# Patient Record
Sex: Female | Born: 1940 | Race: Black or African American | Hispanic: No | Marital: Single | Smoking: Never smoker
Health system: Southern US, Community
[De-identification: ages and names within clinical notes are randomized; demographics above are authoritative.]

## PROBLEM LIST (undated history)

## (undated) DIAGNOSIS — E440 Moderate protein-calorie malnutrition: Secondary | ICD-10-CM

## (undated) DIAGNOSIS — F32A Depression, unspecified: Secondary | ICD-10-CM

## (undated) DIAGNOSIS — Z9289 Personal history of other medical treatment: Secondary | ICD-10-CM

## (undated) DIAGNOSIS — R011 Cardiac murmur, unspecified: Secondary | ICD-10-CM

## (undated) DIAGNOSIS — F329 Major depressive disorder, single episode, unspecified: Secondary | ICD-10-CM

## (undated) DIAGNOSIS — IMO0001 Reserved for inherently not codable concepts without codable children: Secondary | ICD-10-CM

## (undated) DIAGNOSIS — J189 Pneumonia, unspecified organism: Secondary | ICD-10-CM

## (undated) DIAGNOSIS — Z992 Dependence on renal dialysis: Principal | ICD-10-CM

## (undated) DIAGNOSIS — N186 End stage renal disease: Principal | ICD-10-CM

## (undated) DIAGNOSIS — F419 Anxiety disorder, unspecified: Secondary | ICD-10-CM

## (undated) DIAGNOSIS — C9000 Multiple myeloma not having achieved remission: Secondary | ICD-10-CM

## (undated) DIAGNOSIS — K219 Gastro-esophageal reflux disease without esophagitis: Secondary | ICD-10-CM

## (undated) DIAGNOSIS — Z789 Other specified health status: Secondary | ICD-10-CM

## (undated) DIAGNOSIS — S72401A Unspecified fracture of lower end of right femur, initial encounter for closed fracture: Secondary | ICD-10-CM

## (undated) DIAGNOSIS — D649 Anemia, unspecified: Secondary | ICD-10-CM

## (undated) DIAGNOSIS — I10 Essential (primary) hypertension: Secondary | ICD-10-CM

## (undated) DIAGNOSIS — I120 Hypertensive chronic kidney disease with stage 5 chronic kidney disease or end stage renal disease: Principal | ICD-10-CM

## (undated) HISTORY — DX: Personal history of other medical treatment: Z92.89

## (undated) HISTORY — PX: AV FISTULA PLACEMENT: SHX1204

## (undated) HISTORY — DX: End stage renal disease: N18.6

## (undated) HISTORY — DX: Unspecified fracture of lower end of right femur, initial encounter for closed fracture: S72.401A

## (undated) HISTORY — DX: Moderate protein-calorie malnutrition: E44.0

## (undated) HISTORY — DX: Hypertensive chronic kidney disease with stage 5 chronic kidney disease or end stage renal disease: I12.0

## (undated) HISTORY — DX: Dependence on renal dialysis: Z99.2

---

## 2014-03-11 ENCOUNTER — Encounter (HOSPITAL_COMMUNITY): Payer: Self-pay | Admitting: Emergency Medicine

## 2014-03-11 ENCOUNTER — Inpatient Hospital Stay (HOSPITAL_COMMUNITY)
Admission: EM | Admit: 2014-03-11 | Discharge: 2014-03-18 | DRG: 840 | Disposition: A | Discharge status: Home / Self Care | Payer: Medicare Other | Attending: Internal Medicine | Admitting: Internal Medicine

## 2014-03-11 DIAGNOSIS — K922 Gastrointestinal hemorrhage, unspecified: Secondary | ICD-10-CM | POA: Diagnosis present

## 2014-03-11 DIAGNOSIS — D62 Acute posthemorrhagic anemia: Secondary | ICD-10-CM

## 2014-03-11 DIAGNOSIS — K219 Gastro-esophageal reflux disease without esophagitis: Secondary | ICD-10-CM | POA: Diagnosis present

## 2014-03-11 DIAGNOSIS — N186 End stage renal disease: Secondary | ICD-10-CM | POA: Diagnosis present

## 2014-03-11 DIAGNOSIS — Z8661 Personal history of infections of the central nervous system: Secondary | ICD-10-CM | POA: Diagnosis not present

## 2014-03-11 DIAGNOSIS — I120 Hypertensive chronic kidney disease with stage 5 chronic kidney disease or end stage renal disease: Secondary | ICD-10-CM

## 2014-03-11 DIAGNOSIS — D6959 Other secondary thrombocytopenia: Secondary | ICD-10-CM | POA: Diagnosis present

## 2014-03-11 DIAGNOSIS — E871 Hypo-osmolality and hyponatremia: Secondary | ICD-10-CM | POA: Diagnosis present

## 2014-03-11 DIAGNOSIS — D649 Anemia, unspecified: Secondary | ICD-10-CM

## 2014-03-11 DIAGNOSIS — F09 Unspecified mental disorder due to known physiological condition: Secondary | ICD-10-CM | POA: Diagnosis present

## 2014-03-11 DIAGNOSIS — I10 Essential (primary) hypertension: Secondary | ICD-10-CM

## 2014-03-11 DIAGNOSIS — M1710 Unilateral primary osteoarthritis, unspecified knee: Secondary | ICD-10-CM | POA: Diagnosis present

## 2014-03-11 DIAGNOSIS — R5383 Other fatigue: Secondary | ICD-10-CM

## 2014-03-11 DIAGNOSIS — C9000 Multiple myeloma not having achieved remission: Principal | ICD-10-CM

## 2014-03-11 DIAGNOSIS — D61818 Other pancytopenia: Secondary | ICD-10-CM

## 2014-03-11 DIAGNOSIS — R5381 Other malaise: Secondary | ICD-10-CM | POA: Diagnosis present

## 2014-03-11 DIAGNOSIS — Z9221 Personal history of antineoplastic chemotherapy: Secondary | ICD-10-CM | POA: Diagnosis not present

## 2014-03-11 DIAGNOSIS — Z992 Dependence on renal dialysis: Secondary | ICD-10-CM

## 2014-03-11 DIAGNOSIS — K649 Unspecified hemorrhoids: Secondary | ICD-10-CM | POA: Diagnosis present

## 2014-03-11 DIAGNOSIS — IMO0002 Reserved for concepts with insufficient information to code with codable children: Secondary | ICD-10-CM

## 2014-03-11 DIAGNOSIS — N2581 Secondary hyperparathyroidism of renal origin: Secondary | ICD-10-CM | POA: Diagnosis present

## 2014-03-11 DIAGNOSIS — K625 Hemorrhage of anus and rectum: Secondary | ICD-10-CM

## 2014-03-11 HISTORY — DX: Multiple myeloma not having achieved remission: C90.00

## 2014-03-11 HISTORY — DX: End stage renal disease: N18.6

## 2014-03-11 HISTORY — DX: Essential (primary) hypertension: I10

## 2014-03-11 HISTORY — DX: Hypertensive chronic kidney disease with stage 5 chronic kidney disease or end stage renal disease: I12.0

## 2014-03-11 HISTORY — DX: Anemia, unspecified: D64.9

## 2014-03-11 HISTORY — DX: Pneumonia, unspecified organism: J18.9

## 2014-03-11 HISTORY — DX: Gastro-esophageal reflux disease without esophagitis: K21.9

## 2014-03-11 LAB — CBC WITH DIFFERENTIAL/PLATELET
Basophils Absolute: 0 10*3/uL (ref 0.0–0.1)
Basophils Relative: 0 % (ref 0–1)
Eosinophils Absolute: 0.1 10*3/uL (ref 0.0–0.7)
Eosinophils Relative: 4 % (ref 0–5)
HCT: 19.2 % — ABNORMAL LOW (ref 36.0–46.0)
HEMOGLOBIN: 5.9 g/dL — AB (ref 12.0–15.0)
LYMPHS PCT: 29 % (ref 12–46)
Lymphs Abs: 0.8 10*3/uL (ref 0.7–4.0)
MCH: 28.8 pg (ref 26.0–34.0)
MCHC: 30.7 g/dL (ref 30.0–36.0)
MCV: 93.7 fL (ref 78.0–100.0)
MONO ABS: 0.3 10*3/uL (ref 0.1–1.0)
Monocytes Relative: 9 % (ref 3–12)
NEUTROS ABS: 1.6 10*3/uL — AB (ref 1.7–7.7)
NEUTROS PCT: 57 % (ref 43–77)
Platelets: 76 10*3/uL — ABNORMAL LOW (ref 150–400)
RBC: 2.05 MIL/uL — ABNORMAL LOW (ref 3.87–5.11)
RDW: 18 % — ABNORMAL HIGH (ref 11.5–15.5)
WBC: 2.8 10*3/uL — AB (ref 4.0–10.5)

## 2014-03-11 LAB — COMPREHENSIVE METABOLIC PANEL
ALK PHOS: 37 U/L — AB (ref 39–117)
ALT: 11 U/L (ref 0–35)
AST: 29 U/L (ref 0–37)
Albumin: 2.6 g/dL — ABNORMAL LOW (ref 3.5–5.2)
BUN: 25 mg/dL — AB (ref 6–23)
CHLORIDE: 96 meq/L (ref 96–112)
CO2: 26 meq/L (ref 19–32)
CREATININE: 6.08 mg/dL — AB (ref 0.50–1.10)
Calcium: 8.8 mg/dL (ref 8.4–10.5)
GFR calc Af Amer: 7 mL/min — ABNORMAL LOW (ref >90)
GFR, EST NON AFRICAN AMERICAN: 6 mL/min — AB (ref >90)
Glucose, Bld: 81 mg/dL (ref 70–99)
POTASSIUM: 4.4 meq/L (ref 3.7–5.3)
Sodium: 129 mEq/L — ABNORMAL LOW (ref 137–147)
Total Bilirubin: 0.4 mg/dL (ref 0.3–1.2)
Total Protein: >15 g/dL — ABNORMAL HIGH (ref 6.0–8.3)

## 2014-03-11 LAB — TROPONIN I: Troponin I: <0.3 ng/mL (ref <0.30)

## 2014-03-11 LAB — POC OCCULT BLOOD, ED: FECAL OCCULT BLD: POSITIVE — AB

## 2014-03-11 LAB — PREPARE RBC (CROSSMATCH)

## 2014-03-11 LAB — ABO/RH: ABO/RH(D): O POS

## 2014-03-11 MED ORDER — GUAIFENESIN-DM 100-10 MG/5ML PO SYRP
10.0000 mL | ORAL_SOLUTION | Freq: Three times a day (TID) | ORAL | Status: DC | PRN
  Filled 2014-03-11: qty 10

## 2014-03-11 MED ORDER — CYPROHEPTADINE HCL 4 MG PO TABS
4.0000 mg | ORAL_TABLET | Freq: Two times a day (BID) | ORAL | Status: DC
  Administered 2014-03-12 – 2014-03-18 (×14): 4 mg via ORAL
  Filled 2014-03-11 (×16): qty 1

## 2014-03-11 MED ORDER — SODIUM CHLORIDE 0.9 % IJ SOLN
3.0000 mL | Freq: Two times a day (BID) | INTRAMUSCULAR | Status: DC
  Administered 2014-03-12 – 2014-03-18 (×12): 3 mL via INTRAVENOUS

## 2014-03-11 MED ORDER — PROMETHAZINE-CODEINE 6.25-10 MG/5ML PO SYRP
5.0000 mL | ORAL_SOLUTION | Freq: Four times a day (QID) | ORAL | Status: DC | PRN
  Administered 2014-03-13: 5 mL via ORAL
  Filled 2014-03-11: qty 5

## 2014-03-11 MED ORDER — ISOSORBIDE MONONITRATE ER 60 MG PO TB24
60.0000 mg | ORAL_TABLET | Freq: Every day | ORAL | Status: DC
  Administered 2014-03-12 – 2014-03-18 (×6): 60 mg via ORAL
  Filled 2014-03-11 (×7): qty 1

## 2014-03-11 MED ORDER — VALACYCLOVIR HCL 500 MG PO TABS
500.0000 mg | ORAL_TABLET | Freq: Two times a day (BID) | ORAL | Status: DC
  Administered 2014-03-12 (×2): 500 mg via ORAL
  Filled 2014-03-11 (×3): qty 1

## 2014-03-11 MED ORDER — OXYBUTYNIN CHLORIDE 5 MG PO TABS
5.0000 mg | ORAL_TABLET | Freq: Every day | ORAL | Status: DC
  Administered 2014-03-12 – 2014-03-18 (×6): 5 mg via ORAL
  Filled 2014-03-11 (×7): qty 1

## 2014-03-11 MED ORDER — METOLAZONE 5 MG PO TABS
5.0000 mg | ORAL_TABLET | Freq: Two times a day (BID) | ORAL | Status: DC
  Administered 2014-03-12 – 2014-03-18 (×12): 5 mg via ORAL
  Filled 2014-03-11 (×16): qty 1

## 2014-03-11 MED ORDER — HYDRALAZINE HCL 50 MG PO TABS
100.0000 mg | ORAL_TABLET | Freq: Three times a day (TID) | ORAL | Status: DC
  Administered 2014-03-12 – 2014-03-18 (×16): 100 mg via ORAL
  Filled 2014-03-11 (×25): qty 2

## 2014-03-11 MED ORDER — ACETAMINOPHEN-CODEINE #3 300-30 MG PO TABS
1.0000 | ORAL_TABLET | ORAL | Status: DC | PRN
  Administered 2014-03-12: 1 via ORAL
  Filled 2014-03-11: qty 1

## 2014-03-11 MED ORDER — NIFEDIPINE ER OSMOTIC RELEASE 90 MG PO TB24
90.0000 mg | ORAL_TABLET | Freq: Every day | ORAL | Status: DC
  Administered 2014-03-12 – 2014-03-18 (×6): 90 mg via ORAL
  Filled 2014-03-11 (×7): qty 1

## 2014-03-11 MED ORDER — SEVELAMER CARBONATE 800 MG PO TABS
800.0000 mg | ORAL_TABLET | Freq: Three times a day (TID) | ORAL | Status: DC
  Administered 2014-03-12 – 2014-03-18 (×12): 800 mg via ORAL
  Filled 2014-03-11 (×22): qty 1

## 2014-03-11 MED ORDER — DEXTROMETHORPHAN-GUAIFENESIN 10-100 MG/5ML PO LIQD: 10.0000 mL | Freq: Three times a day (TID) | ORAL | Status: DC | PRN

## 2014-03-11 NOTE — ED Notes (Signed)
Family at bedside. 

## 2014-03-11 NOTE — ED Provider Notes (Addendum)
CSN: [id number]     Arrival date & time 03/11/14  1627 History   First MD Initiated Contact with Patient 03/11/14 1628     Chief Complaint  Patient presents with  . Abnormal Lab     (Consider location/radiation/quality/duration/timing/severity/associated sxs/prior Treatment) The history is provided by the patient. No language interpreter was used.  [name] is a 73 -year-old female with past medical history of end-stage renal disease on dialysis every Tuesday/Thursday/Saturday, hypertension, anemia, multiple myeloma, thrombocytopenia presenting to the ED with weakness and fatigue. Patient reported that she had dialysis performed earlier today and had received a phone call regarding that her hemoglobin was low-as per patient, reported that her hemoglobin was 6.5. Patient recently moved from [place] to [place] a short period ago. As per patient, reported that she has close to a dozen transfusions in her lifetime thus far. Stated that she started dialysis in October 2014. Denied fever, chest pain, shortness of breath, difficulty breathing, nausea, vomiting, diarrhea, melena, hematochezia, hematemesis, hemoptysis, hematuria, dysuria. PCP none  Past Medical History  Diagnosis Date  . Renal disorder   . ESRD (end stage renal disease)   . Hypertension   . Multiple myeloma 2009  . Thyroid disease   . Hyperparathyroidism   . Thrombocytopenia   . Anemia    History reviewed. No pertinent past surgical history. History reviewed. No pertinent family history. History  Substance Use Topics  . Smoking status: Not on file  . Smokeless tobacco: Not on file  . Alcohol Use: Not on file   OB History   Grav Para Term Preterm Abortions TAB SAB Ect Mult Living                 Review of Systems  Constitutional: Positive for fatigue. Negative for fever and appetite change.  Respiratory: Negative for chest tightness and shortness of breath.   Cardiovascular: Negative for chest pain.   Gastrointestinal: Negative for nausea, vomiting, abdominal pain, diarrhea, constipation, blood in stool and anal bleeding.  Genitourinary: Negative for dysuria and decreased urine volume.  Neurological: Positive for dizziness and weakness. Negative for headaches.      Allergies  Review of patient's allergies indicates no known allergies.  Home Medications   Prior to Admission medications   Medication Sig Start Date End Date Taking? Authorizing Provider  acetaminophen-codeine (TYLENOL #3) 300-30 MG per tablet Take by mouth every 4 (four) hours as needed for moderate pain.   Yes Historical Provider, MD  cyproheptadine (PERIACTIN) 4 MG tablet Take 4 mg by mouth 2 (two) times daily.   Yes Historical Provider, MD  dextromethorphan-guaiFENesin (ROBITUSSIN-DM) 10-100 MG/5ML liquid Take 10 mLs by mouth 3 (three) times daily as needed for cough.   Yes Historical Provider, MD  hydrALAZINE (APRESOLINE) 100 MG tablet Take 100 mg by mouth every 8 (eight) hours.   Yes Historical Provider, MD  isosorbide mononitrate (IMDUR) 60 MG 24 hr tablet Take 60 mg by mouth daily.   Yes Historical Provider, MD  metolazone (ZAROXOLYN) 5 MG tablet Take 5 mg by mouth 2 (two) times daily.   Yes Historical Provider, MD  Multiple Vitamins-Iron (DAILY-VITE/IRON PO) Take 1 tablet by mouth daily.   Yes Historical Provider, MD  NIFEdipine (PROCARDIA XL/ADALAT-CC) 90 MG 24 hr tablet Take 90 mg by mouth daily.   Yes Historical Provider, MD  oxybutynin (DITROPAN) 5 MG tablet Take 5 mg by mouth daily.   Yes Historical Provider, MD  promethazine-codeine (PHENERGAN WITH CODEINE) 6.25-10 MG/5ML syrup Take 5  mLs by mouth every 6 (six) hours as needed for cough.   Yes Historical Provider, MD  sevelamer carbonate (RENVELA) 800 MG tablet Take 800 mg by mouth 3 (three) times daily with meals.   Yes Historical Provider, MD  valACYclovir (VALTREX) 500 MG tablet Take 500 mg by mouth 2 (two) times daily.   Yes Historical Provider, MD   BP  174/107  Pulse 67  Temp(Src) 98.7 F (37.1 C) (Oral)  Resp 21  SpO2 100% Physical Exam  Nursing note and vitals reviewed. Constitutional: She is oriented to person, place, and time. She appears well-developed and well-nourished. No distress.  HENT:  Head: Normocephalic and atraumatic.  Mouth/Throat: No oropharyngeal exudate.  Dry mucous membranes noted  Eyes: Conjunctivae and EOM are normal. Pupils are equal, round, and reactive to light. Right eye exhibits no discharge. Left eye exhibits no discharge.  Neck: Normal range of motion. Neck supple. No tracheal deviation present.  Cardiovascular: Normal rate, regular rhythm and normal heart sounds.  Exam reveals no friction rub.   No murmur heard. Pulses:      Radial pulses are 2+ on the right side, and 2+ on the left side.       Dorsalis pedis pulses are 2+ on the right side, and 2+ on the left side.  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  Pulmonary/Chest: Effort normal and breath sounds normal. No respiratory distress. She has no wheezes. She has no rales.  Genitourinary: Guaiac positive stool.  Rectal exam: Toilet paper at here to the anus with bright red blood. Negative swelling, erythema, inflammation, lesions, sores, external hemorrhoids identified to the anus. Negative active drainage or bleeding noted. Strong sphincter tone. Negative palpation of masses or lesions noted, negative palpation of polyps. Bright red blood noted on glove.  Musculoskeletal: Normal range of motion.  Full ROM to upper and lower extremities without difficulty noted, negative ataxia noted.  Lymphadenopathy:    She has no cervical adenopathy.  Neurological: She is alert and oriented to person, place, and time. No cranial nerve deficit. She exhibits normal muscle tone. Coordination normal.  Cranial  nerves III-XII grossly intact Strength 5+/5+ to upper and lower extremities bilaterally with resistance applied, equal distribution noted Equal grip strength bilaterally Negative facial drooping Negative slurred speech Negative aphasia  Skin: Skin is warm and dry. No rash noted. She is not diaphoretic. No erythema. No pallor.  Psychiatric: She has a normal mood and affect. Her behavior is normal. Thought content normal.    ED Course  Procedures (including critical care time)  7:25 PM This provider spoke with Dr. Sheran Luz - discussed case, history, labs in great detail. Patient to be admitted to the hospital for anemia and possible GI bleed-patient to telemetry as inpatient.  Results for orders placed during the hospital encounter of 03/11/14  CBC WITH DIFFERENTIAL      Result Value Ref Range   WBC 2.8 (*) 4.0 - 10.5 K/uL   RBC 2.05 (*) 3.87 - 5.11 MIL/uL   Hemoglobin 5.9 (*) 12.0 - 15.0 g/dL   HCT 19.2 (*) 36.0 - 46.0 %   MCV 93.7  78.0 - 100.0 fL   MCH 28.8  26.0 - 34.0 pg   MCHC 30.7  30.0 - 36.0 g/dL   RDW 18.0 (*) 11.5 - 15.5 %   Platelets 76 (*) 150 - 400  K/uL   Neutrophils Relative % 57  43 - 77 %   Neutro Abs 1.6 (*) 1.7 - 7.7 K/uL   Lymphocytes Relative 29  12 - 46 %   Lymphs Abs 0.8  0.7 - 4.0 K/uL   Monocytes Relative 9  3 - 12 %   Monocytes Absolute 0.3  0.1 - 1.0 K/uL   Eosinophils Relative 4  0 - 5 %   Eosinophils Absolute 0.1  0.0 - 0.7 K/uL   Basophils Relative 0  0 - 1 %   Basophils Absolute 0.0  0.0 - 0.1 K/uL  COMPREHENSIVE METABOLIC PANEL      Result Value Ref Range   Sodium 129 (*) 137 - 147 mEq/L   Potassium 4.4  3.7 - 5.3 mEq/L   Chloride 96  96 - 112 mEq/L   CO2 26  19 - 32 mEq/L   Glucose, Bld 81  70 - 99 mg/dL   BUN 25 (*) 6 - 23 mg/dL   Creatinine, Ser 6.08 (*) 0.50 - 1.10 mg/dL   Calcium 8.8  8.4 - 10.5 mg/dL   Total Protein >15.0 (*) 6.0 - 8.3 g/dL   Albumin 2.6 (*) 3.5 - 5.2 g/dL   AST 29  0 - 37 U/L   ALT 11  0 - 35 U/L   Alkaline  Phosphatase 37 (*) 39 - 117 U/L   Total Bilirubin 0.4  0.3 - 1.2 mg/dL   GFR calc non Af Amer 6 (*) >90 mL/min   GFR calc Af Amer 7 (*) >90 mL/min  POC OCCULT BLOOD, ED      Result Value Ref Range   Fecal Occult Bld POSITIVE (*) NEGATIVE  TYPE AND SCREEN      Result Value Ref Range   ABO/RH(D) O POS     Antibody Screen NEG     Sample Expiration 03/14/2014     Unit Number [id number]     Blood Component Type RBC LR PHER2     Unit division 00     Status of Unit ISSUED     Transfusion Status OK TO TRANSFUSE     Crossmatch Result Compatible    ABO/RH      Result Value Ref Range   ABO/RH(D) O POS      Labs Review Labs Reviewed  CBC WITH DIFFERENTIAL - Abnormal; Notable for the following:    WBC 2.8 (*)    RBC 2.05 (*)    Hemoglobin 5.9 (*)    HCT 19.2 (*)    RDW 18.0 (*)    Platelets 76 (*)    Neutro Abs 1.6 (*)    All other components within normal limits  COMPREHENSIVE METABOLIC PANEL - Abnormal; Notable for the following:    Sodium 129 (*)    BUN 25 (*)    Creatinine, Ser 6.08 (*)    Total Protein >15.0 (*)    Albumin 2.6 (*)    Alkaline Phosphatase 37 (*)    GFR calc non Af Amer 6 (*)    GFR calc Af Amer 7 (*)    All other components within normal limits  POC OCCULT BLOOD, ED - Abnormal; Notable for the following:    Fecal Occult Bld POSITIVE (*)    All other components within normal limits  TROPONIN I  HEMOGLOBIN AND HEMATOCRIT, BLOOD  CBC  BASIC METABOLIC PANEL  TYPE AND SCREEN  ABO/RH  PREPARE RBC (CROSSMATCH)    Imaging Review No results found.   EKG Interpretation  Date/Time:  Thursday March 11 2014 16:35:41 EDT Ventricular Rate:  66 PR Interval:  181 QRS Duration: 86 QT Interval:  477 QTC Calculation: 500 R Axis:   -3 Text Interpretation:  Sinus rhythm Borderline T wave abnormalities  Borderline prolonged QT interval No prior for comparison Confirmed by  DOCHERTY  MD, MEGAN ([id number]) on 03/11/2014 7:34:00 PM      MDM   Final diagnoses:   Anemia  GI bleed  ESRD on dialysis    Filed Vitals:   03/11/14 1833 03/11/14 1835 03/11/14 1845 03/11/14 1859  BP: 165/96 165/96 155/91 174/107  Pulse: 64 65 67   Temp: 98.4 F (36.9 C)   98.7 F (37.1 C)  TempSrc: Oral   Oral  Resp: '16 16 21   ' SpO2: 100% 99% 100%     EKG noted normal sinus rhythm with a heart rate of 66 bpm with T wave abnormalities with borderline prolonged QT interval - no prior EKG tracing noted. Troponin negative elevation. CBC noted low white blood cell count of 2.8. Low red blood cells of 2.05. Hemoglobin decreased to 5.9 with hematocrit 19.2. Platelets 76. Elevated BUN and creatinine-BUN 25, creatinine 6.08-patient has end-stage renal disease. Patient hyponatremic with sodium of 129. Protein greater than 15.0-patient has history of multiple myeloma. Fecal occult positive. Patient presenting to the ED with fatigue, lethargy after dialysis session today. Patient was called that she had a hemoglobin of 6.5. While in ED setting hemoglobin dropped to 5.9. Bright red blood noted on stool card. Patient has history of blood transfusions, recently moved from [place] to [place]. Patient stable, afebrile. Patient not septic appearing. Patient to be admitted to the hospital for GI bleed, drop in hemoglobin, drop in platelets. Patient does have history of multiple myeloma. Patient given blood transfusion while in the ED setting. Discussed plan for admission with patient and family who agree to plan of care. Patient admits to telemetry as inpatient under the care of Dr. Sheran Luz.  Jamse Mead, PA-C 03/13/14 [place], PA-C 03/15/14 [id number]

## 2014-03-11 NOTE — ED Notes (Signed)
Marissa PA-C notified of pt's hemoglobin

## 2014-03-11 NOTE — ED Notes (Signed)
Per EMS: pt went to dialysis today completing a full treatment. Pt goes to dialysis Tuesday, thursdays, saturdays. While at dialysis pt had an abnormal hemoglobin of 6.6. Pt A&Ox4, respirations equal and unlabored, skin warm and dry. No other complaints

## 2014-03-11 NOTE — ED Notes (Signed)
Dr. Gardner at bedside 

## 2014-03-11 NOTE — H&P (Signed)
[facility] History and Physical  Daveah Varone HGD:[id number] DOB: Jun 02, 1941 DOA: 03/11/2014  Referring physician: EDP PCP: No primary provider on file.   Chief Complaint: low HGB   HPI: [name] is a 73 y.o. female with ESRD, dialized earlier today.  Received phone call after dialysis stating that her HGB is low so she presented to ED.  She is asymptomatic at this time, denies bleeding from anywhere subjectively (objectively she has BRBPR on rectal exam).  No blood loss today at dialysis (did lose a lot on 02/27/14 she states).  No hematemesis, no black tarry stool (again, subjective only).  She has a h/o multiple myeloma, and has had GIB in the past worked up in [place].  Review of Systems: Systems reviewed.  As above, otherwise negative  Past Medical History  Diagnosis Date  . Renal disorder   . ESRD (end stage renal disease)   . Hypertension   . Multiple myeloma 2009  . Thyroid disease   . Hyperparathyroidism   . Thrombocytopenia   . Anemia    History reviewed. No pertinent past surgical history. Social History:  has no tobacco, alcohol, and drug history on file.  No Known Allergies  History reviewed. No pertinent family history.   Prior to Admission medications   Medication Sig Start Date End Date Taking? Authorizing Provider  acetaminophen-codeine (TYLENOL #3) 300-30 MG per tablet Take by mouth every 4 (four) hours as needed for moderate pain.   Yes Historical Provider, MD  cyproheptadine (PERIACTIN) 4 MG tablet Take 4 mg by mouth 2 (two) times daily.   Yes Historical Provider, MD  dextromethorphan-guaiFENesin (ROBITUSSIN-DM) 10-100 MG/5ML liquid Take 10 mLs by mouth 3 (three) times daily as needed for cough.   Yes Historical Provider, MD  hydrALAZINE (APRESOLINE) 100 MG tablet Take 100 mg by mouth every 8 (eight) hours.   Yes Historical Provider, MD  isosorbide mononitrate (IMDUR) 60 MG 24 hr tablet Take 60 mg by mouth daily.   Yes Historical Provider, MD   metolazone (ZAROXOLYN) 5 MG tablet Take 5 mg by mouth 2 (two) times daily.   Yes Historical Provider, MD  Multiple Vitamins-Iron (DAILY-VITE/IRON PO) Take 1 tablet by mouth daily.   Yes Historical Provider, MD  NIFEdipine (PROCARDIA XL/ADALAT-CC) 90 MG 24 hr tablet Take 90 mg by mouth daily.   Yes Historical Provider, MD  oxybutynin (DITROPAN) 5 MG tablet Take 5 mg by mouth daily.   Yes Historical Provider, MD  promethazine-codeine (PHENERGAN WITH CODEINE) 6.25-10 MG/5ML syrup Take 5 mLs by mouth every 6 (six) hours as needed for cough.   Yes Historical Provider, MD  sevelamer carbonate (RENVELA) 800 MG tablet Take 800 mg by mouth 3 (three) times daily with meals.   Yes Historical Provider, MD  valACYclovir (VALTREX) 500 MG tablet Take 500 mg by mouth 2 (two) times daily.   Yes Historical Provider, MD   Physical Exam: Filed Vitals:   03/11/14 2000  BP: 180/100  Pulse: 65  Temp:   Resp: 20    BP 180/100  Pulse 65  Temp(Src) 98.7 F (37.1 C) (Oral)  Resp 20  SpO2 97%  General Appearance:    Alert, oriented, no distress, appears stated age  Head:    Normocephalic, atraumatic  Eyes:    PERRL, EOMI, sclera non-icteric        Nose:   Nares without drainage or epistaxis. Mucosa, turbinates normal  Throat:   Moist mucous membranes. Oropharynx without erythema or exudate.  Neck:  Supple. No carotid bruits.  No thyromegaly.  No lymphadenopathy.   Back:     No CVA tenderness, no spinal tenderness  Lungs:     Clear to auscultation bilaterally, without wheezes, rhonchi or rales  Chest wall:    No tenderness to palpitation  Heart:    Regular rate and rhythm without murmurs, gallops, rubs  Abdomen:     Soft, non-tender, nondistended, normal bowel sounds, no organomegaly  Genitalia:    deferred  Rectal:    deferred  Extremities:   No clubbing, cyanosis or edema.  Pulses:   2+ and symmetric all extremities  Skin:   Skin color, texture, turgor normal, no rashes or lesions  Lymph nodes:    Cervical, supraclavicular, and axillary nodes normal  Neurologic:   CNII-XII intact. Normal strength, sensation and reflexes      throughout    Labs on Admission:  Basic Metabolic Panel:  Recent Labs Lab 03/11/14 1658  NA 129*  K 4.4  CL 96  CO2 26  GLUCOSE 81  BUN 25*  CREATININE 6.08*  CALCIUM 8.8   Liver Function Tests:  Recent Labs Lab 03/11/14 1658  AST 29  ALT 11  ALKPHOS 37*  BILITOT 0.4  PROT >15.0*  ALBUMIN 2.6*   No results found for this basename: LIPASE, AMYLASE,  in the last 168 hours No results found for this basename: AMMONIA,  in the last 168 hours CBC:  Recent Labs Lab 03/11/14 1658  WBC 2.8*  NEUTROABS 1.6*  HGB 5.9*  HCT 19.2*  MCV 93.7  PLT 76*   Cardiac Enzymes: No results found for this basename: CKTOTAL, CKMB, CKMBINDEX, TROPONINI,  in the last 168 hours  BNP (last 3 results) No results found for this basename: PROBNP,  in the last 8760 hours CBG: No results found for this basename: GLUCAP,  in the last 168 hours  Radiological Exams on Admission: No results found.  EKG: Independently reviewed.  Assessment/Plan Principal Problem:   BRBPR (bright red blood per rectum) Active Problems:   Acute blood loss anemia   ESRD (end stage renal disease)   Multiple myeloma   Pancytopenia   1. BRBPR and acute blood loss anemia - admitting patient, needs GI consult in AM.  Transfusing 2 units PRBC, recheck at midnight and again in AM.  SCDs only for DVT ppx. 2. Thrombocytopenia - platelets of 76, monitor but no transfusion at this point.  Pancytopenia likely chronic with MM. 3. MM - patient has declined treatment, known to be active. 4. ESRD - dialysis TTS, last dialysis earlier today. 5. HTN - continue home meds.    Code Status: Full  Family Communication: Family at bedside Disposition Plan: Admit to inpatient   Time spent: 67 min  Marielis Samara M. [facility] Pager [phone]  If 7AM-7PM, please contact the day team  taking care of the patient [email or web address] Password [facility] 03/11/2014, 8:05 PM

## 2014-03-12 ENCOUNTER — Inpatient Hospital Stay (HOSPITAL_COMMUNITY): Payer: Medicare Other

## 2014-03-12 LAB — CBC
HCT: 20.3 % — ABNORMAL LOW (ref 36.0–46.0)
Hemoglobin: 6.4 g/dL — CL (ref 12.0–15.0)
MCH: 29.4 pg (ref 26.0–34.0)
MCHC: 31.5 g/dL (ref 30.0–36.0)
MCV: 93.1 fL (ref 78.0–100.0)
Platelets: 80 10*3/uL — ABNORMAL LOW (ref 150–400)
RBC: 2.18 MIL/uL — AB (ref 3.87–5.11)
RDW: 18.7 % — ABNORMAL HIGH (ref 11.5–15.5)
WBC: 4.2 10*3/uL (ref 4.0–10.5)

## 2014-03-12 LAB — BASIC METABOLIC PANEL
BUN: 32 mg/dL — AB (ref 6–23)
CHLORIDE: 96 meq/L (ref 96–112)
CO2: 25 meq/L (ref 19–32)
CREATININE: 7.72 mg/dL — AB (ref 0.50–1.10)
Calcium: 8.8 mg/dL (ref 8.4–10.5)
GFR calc Af Amer: 5 mL/min — ABNORMAL LOW (ref >90)
GFR calc non Af Amer: 5 mL/min — ABNORMAL LOW (ref >90)
Glucose, Bld: 82 mg/dL (ref 70–99)
Potassium: 4.4 mEq/L (ref 3.7–5.3)
Sodium: 130 mEq/L — ABNORMAL LOW (ref 137–147)

## 2014-03-12 LAB — PHOSPHORUS: Phosphorus: 6.5 mg/dL — ABNORMAL HIGH (ref 2.3–4.6)

## 2014-03-12 LAB — PREPARE RBC (CROSSMATCH)

## 2014-03-12 LAB — LACTATE DEHYDROGENASE: LDH: 222 U/L (ref 94–250)

## 2014-03-12 MED ORDER — DARBEPOETIN ALFA-POLYSORBATE 200 MCG/0.4ML IJ SOLN
200.0000 ug | INTRAMUSCULAR | Status: DC
  Administered 2014-03-13: 200 ug via INTRAVENOUS
  Filled 2014-03-12: qty 0.4

## 2014-03-12 MED ORDER — SODIUM CHLORIDE 0.9 % IV SOLN
60.0000 mg | Freq: Once | INTRAVENOUS | Status: AC
  Administered 2014-03-13: 60 mg via INTRAVENOUS
  Filled 2014-03-12: qty 20

## 2014-03-12 MED ORDER — RENA-VITE PO TABS
1.0000 | ORAL_TABLET | Freq: Every day | ORAL | Status: DC
  Administered 2014-03-12 – 2014-03-13 (×2): 1 via ORAL
  Administered 2014-03-14: 21:00:00 via ORAL
  Administered 2014-03-15 – 2014-03-18 (×3): 1 via ORAL
  Filled 2014-03-12 (×7): qty 1

## 2014-03-12 MED ORDER — DOXERCALCIFEROL 4 MCG/2ML IV SOLN
2.0000 ug | INTRAVENOUS | Status: DC
  Administered 2014-03-16 – 2014-03-18 (×2): 2 ug via INTRAVENOUS
  Filled 2014-03-12 (×3): qty 2

## 2014-03-12 MED ORDER — VALACYCLOVIR HCL 500 MG PO TABS
500.0000 mg | ORAL_TABLET | Freq: Every day | ORAL | Status: DC
  Administered 2014-03-14 – 2014-03-18 (×5): 500 mg via ORAL
  Filled 2014-03-12 (×6): qty 1

## 2014-03-12 NOTE — Consult Note (Signed)
[place]  Telephone:(336) [place] NOTE  [name]                                MR#: [id number]  DOB: [birth year]                           CSN#: [id number]  Referring MD:  [facility]   Primary MD: Dr.  Luiz Iron for Consult: Multiple Myeloma   ELT:[id number] [name] is a 73 y.o.   female with multiple medical issues, recently moved from [place], [place] to [place],  asked to see in consultation for continuation of care regarding her history of multiple myeloma. According to available records, she was diagnosed in June of  2009, receiving chemotherapy under the care of Dr. Chauncey Fischer (tel number [phone])  She was treated in the past with Revlimid and Velcade. First cycle was given on 05/17/2008  As diseased progressed, she was started on Carfilzomib after declining a high dose chemo and ASCT. Cycle 1 was given on 05/25/13. She received Zometa as well on a periodic basis.  She was admitted via ED after dializing for End Stage Renal Disease after found to have severe anemia in the setting of positive hemoccult.She was weak, had exertional shortness of breath, without cardiac or other respiratory issues. She denied macroscopic blood in stools, although she did notice darker stools.  Denies risk for Hepatitis or HIV.Patient denies history of recurrent infection or atypical infections such as shingles of meningitis.Patient denies any  family history of bone marrow disorders.   During admission, she also complained of significant right knee pain. A metastatic bone survey on 6/12 is negative at  spine, knees and long bones, but small lytic lesions within the skull and right scapula were seen. She will need continuation of treatment on outpatient basis.   Her Hb on admission was 5.9, with Hct of 19.2. Her platelets and WBC were low as well. She received transfusion of 2 units of  blood with some response, last Hb at 6.4.  She is now on Epo by Renal service.This is following a recent episodes of bright red blood per rectum since May of this year. Of note, her Hb as of 5/13 was 6.5 with transfusion of 2 units of blood.  Peripheral blood smear ordered for review , SPEP with immunofixation, LDH and light chains are pending.         PMH:  Past Medical History  Diagnosis Date  . Renal disorder   . ESRD (end stage renal disease)   . Hypertension   . Multiple myeloma 2009  . Thyroid disease   . Hyperparathyroidism   . Thrombocytopenia   . Anemia   . Shortness of breath   . Pneumonia   . GERD (gastroesophageal reflux disease)   . Arthritis     rt knee is stiff    Surgeries:  History reviewed. No pertinent past surgical history.  Allergies: No Known Allergies  Medications:   Prior to Admission:  . cyproheptadine  4 mg Oral BID  . hydrALAZINE  100 mg Oral 3 times per day  . isosorbide mononitrate  60 mg Oral Daily  . metolazone  5 mg Oral BID  . NIFEdipine  90 mg Oral Daily  . oxybutynin  5 mg Oral Daily  . sevelamer carbonate  800 mg Oral TID WC  . sodium chloride  3 mL Intravenous Q12H  . valACYclovir  500 mg Oral BID    QMG:[id number], guaiFENesin-dextromethorphan, promethazine-codeine  ROS: Constitutional: Denies fevers, chills or abnormal night sweats Eyes: Denies blurriness of vision, double vision or watery eyes Ears, nose, mouth, throat, and face: Denies mucositis or sore throat Respiratory: Denies cough, dyspnea or wheezes Cardiovascular: Denies palpitation, chest discomfort or lower extremity swelling Gastrointestinal:  Denies nausea, heartburn or change in bowel habits Skin: Denies abnormal skin rashes Lymphatics: Denies new lymphadenopathy or easy bruising Neurological: Denies numbness, tingling or new weaknesses, She does complain of intermittent cramps on her feed over the last couple of months Behavioral/Psych: She has been more anxious following her son's  death, more tearful and depressed. No suicidal thoughts All other systems were reviewed with the patient and are negative.   Family History:    No family history of hematological  Disorders. No cancer in the family. No siblings  Social History:  reports that she has never smoked. She has never used smokeless tobacco. She reports that she does not drink alcohol or use illicit drugs. Single, had 4 children. Her son recently died in his sleep, in the setting of severe hypertension. She moved from [place] to be with her family after her son's death.  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   Physical Exam    Filed Vitals:   03/12/14 0443  BP: 142/79  Pulse: 71  Temp: 99.1 F (37.3 C)  Resp: 18   Filed Weights   03/11/14 2041  Weight: 139 lb 9.6 oz (63.322 kg)    GENERAL: alert, no distress anxious, tearful SKIN: skin color, texture, turgor are normal, no rashes or significant lesions EYES: normal, conjunctiva are pink and non-injected, sclera clear OROPHARYNX:no exudate, no erythema and lips, buccal mucosa, and tongue normal . Poor dentition NECK: supple, thyroid normal size, non-tender, without nodularity LYMPH:  no palpable lymphadenopathy in the cervical, axillary or inguinal area LUNGS: clear to auscultation and percussion with normal breathing effort HEART: regular rate & rhythm and no murmurs and no lower extremity edema. Left upper extremity AVF ABDOMEN: abdomen soft, non-tender and normal bowel sounds Musculoskeletal:no cyanosis of digits and no clubbing . Frequent cramps in feet. PSYCH: alert & oriented x 3 with fluent speech NEURO: no focal motor/sensory deficits   Labs:   SPEP/UPEP: pending No results found for this basename: SPEP, UPEP    _0 (IGA,IGG,IGM)@  CBC   Recent Labs Lab 03/11/14 1658 03/12/14 0655  WBC 2.8* 4.2  HGB 5.9* 6.4*  HCT 19.2* 20.3*  PLT 76* 80*  MCV 93.7 93.1  MCH 28.8 29.4  MCHC 30.7 31.5  RDW  18.0* 18.7*  LYMPHSABS 0.8  --   MONOABS 0.3  --   EOSABS 0.1  --   BASOSABS 0.0  --      CMP    Recent Labs Lab 03/11/14 1658 03/12/14 0655  NA 129* 130*  K 4.4 4.4  CL 96 96  CO2 26 25  GLUCOSE 81 82  BUN 25* 32*  CREATININE 6.08* 7.72*  CALCIUM 8.8 8.8  AST 29  --   ALT 11  --   ALKPHOS 37*  --   BILITOT 0.4  --      Anemia panel:  No results found for this basename: VITAMINB12, FOLATE, FERRITIN, TIBC, IRON, RETICCTPCT,  in the last 72 hours   Imaging Studies:  No results found.  A/P: 73 y.o. female with   1. Multiple Myeloma S/p chemotherapy with Velcade, Revlimid, most recently on Carfilzomib. She also received intermittently Zometa. A metastatic bone survey demonstrates small lytic lesions within the skull and right scapula. SPEP with IFE, LDH and serum light chains pending. Calcium levels within normal limits She has relocated to [facility] from [place].  Further treatment and continuation of care will be scheduled as outpatient.  May need radiation oncology evaluation   2. Anemia in the setting of Multiple Myeloma, Hemoccult positive  and ESRD on HD She has received 2 units of blood upon admission, Records show that her baseline may be between 7.5-9.  Continue supportive care Agree with Epoietin, received on 6/12.  Consider GI evaluation to rule out other etiologies.   3. Thrombocytopenia  Secondary to Multiple Myeloma Monitor closely Avoid Heparin products while on HD  4. Hyponatremia Secondary to multiple myeloma, dilution  Primary team and renal team following.   5. Full Code  Other medical issues as per admitting and renal teams.     **Disclaimer: This note was dictated with voice recognition software. Similar sounding words can inadvertently be transcribed and this note may contain transcription errors which may not have been corrected upon publication of note.** WERTMAN,SARA E, PA-C 03/12/2014 9:28 AM   ADDENDUM:  I  agree with the above.  I am a little confused as to when she had her last chemotherapy. She says she had back in October of last year. It sounds like she started dialysis in.  I really need to get some more records from [place]. The doctor up there I recommended a stem cell transplant. She apparently declined. From what she tells me, she was never told about having a stem cell transplant.  I really need to see what the cytogenetics were on her myeloma. I am I sure if these would be available. I'm not sure when her last bone marrow test was done.  I really think we need to do another bone marrow test on her. It would be incredibly helpful to see what the status of her marrow is. In addition, we really need to see what cytogenetics are. No would think that after having myeloma for 5 or 6 years, and if she is progressing, that she would have poor prognostic cytogenetics.  One problem that we are going to have is that she has a dialysis 3 times a week. Trying to do chemotherapy might be a logistical issue. She says that she takes public transportation to her dialysis.  Unfortunately, my office is in [facility] which I think is going to be very hard for her to get to. I suspect that we may have to see about getting her treated at the main [address]. If that is the case, then we will have to get him one of the doctors at the [facility] to see her.  I did send off myeloma studies on her. With a protein of over 15, one would have to think that her myeloma is quite extensive. One might also consider the possibility of hyperviscosity depending on what type of heavy chain she is producing.  Her bone survey and doesn't show a lot of myeloma.  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 As such, a bone marrow test would be helpful.  I think this is probably going to be a tough problem. I know that we do have some treatment options that could  be  considered. We have oral medications. We do have weekly infusions.  I am still not sure how much she really understands as to the severity of her problem.  Trying to do these tests as an outpatient is going to be difficult. I really think that she has to stay until we can get a bone marrow test done. I think this would make it a lot easier for her and we can get very useful information from the bone marrow biopsy.  She definitely needs one of the bone hardeners that we use.  Again, I think this is going to be a very difficult situation for her. I think she has a really bad problem and hopefully we can stabilize and and improved to help improve her blood counts.  I will also get some iron studies on her.  We will follow her along. Again, I really think that getting a bone marrow test is going to be very helpful and I think it would just be easiest if she was an inpatient for this. I will see if radiology can do this on Monday.  [name]

## 2014-03-12 NOTE — Progress Notes (Signed)
[facility] PROGRESS NOTE Interim History: 73 y.o. female with ESRD, dialized earlier today. Received phone call after dialysis stating that her HGB is low so she presented to ED. She is asymptomatic at this time, denies bleeding from anywhere subjectively (objectively she has BRBPR on rectal exam). No blood loss today at dialysis     Assessment/Plan: Severe anemia/generalized weakness: - Recieved 2 units of PRBC. CBC after transfusion showed a Hbg < 7.0, pt still feels weak, transfuse an additional unit of PRBC. Recheck a CBC post transfusional. - FOBT positive. Rectal exam showed brown stool. She denies BRBPR. - The most likely cause of her pancytopenia is her MM which she is not on treatment.  - Consult hematology.   Multiple myeloma/Thrombocytopenia /Pancytopenia - cont to monitor. - albumin protein dissociation. - consult hematology will need follow up as an outpatient.   ESRD (end stage renal disease) - TTS, will consult renal for HD in on Saturday. - ESRD is mot likely due to MM, getting records from her oncologist.  HTN (hypertension) - cont home meds.    Code Status: Full  Family Communication: Family at bedside  Disposition Plan: Admit to inpatient     Consultants:  renal  Procedures:  FOBT:+  Antibiotics:  none  HPI/Subjective: Feels weak.  Objective: Filed Vitals:   03/11/14 1945 03/11/14 2000 03/11/14 2041 03/12/14 0443  BP: 173/100 180/100 179/91 142/79  Pulse: 66 65 57 71  Temp:   99.3 F (37.4 C) 99.1 F (37.3 C)  TempSrc:      Resp: '15 20 18 18  ' Height:   '5\' 7"'  (1.702 m)   Weight:   63.322 kg (139 lb 9.6 oz)   SpO2: 99% 97% 93% 92%    Intake/Output Summary (Last 24 hours) at 03/12/14 0750 Last data filed at 03/11/14 1859  Gross per 24 hour  Intake  262.5 ml  Output      0 ml  Net  262.5 ml   Filed Weights   03/11/14 2041  Weight: 63.322 kg (139 lb 9.6 oz)    Exam:  General: Alert, awake, oriented x3, in no acute  distress.  HEENT: No bruits, no goiter. +JVD Heart: Regular rate and rhythm, without murmurs, rubs, gallops.  Lungs: Good air movement, clear Abdomen: Soft, nontender, nondistended, positive bowel sounds.    Data Reviewed: Basic Metabolic Panel:  Recent Labs Lab 03/11/14 1658  NA 129*  K 4.4  CL 96  CO2 26  GLUCOSE 81  BUN 25*  CREATININE 6.08*  CALCIUM 8.8   Liver Function Tests:  Recent Labs Lab 03/11/14 1658  AST 29  ALT 11  ALKPHOS 37*  BILITOT 0.4  PROT >15.0*  ALBUMIN 2.6*   No results found for this basename: LIPASE, AMYLASE,  in the last 168 hours No results found for this basename: AMMONIA,  in the last 168 hours CBC:  Recent Labs Lab 03/11/14 1658  WBC 2.8*  NEUTROABS 1.6*  HGB 5.9*  HCT 19.2*  MCV 93.7  PLT 76*   Cardiac Enzymes:  Recent Labs Lab 03/11/14 2218  TROPONINI <0.30   BNP (last 3 results) No results found for this basename: PROBNP,  in the last 8760 hours CBG: No results found for this basename: GLUCAP,  in the last 168 hours  No results found for this or any previous visit (from the past 240 hour(s)).   Studies: No results found.  Scheduled Meds: . cyproheptadine  4 mg Oral BID  . hydrALAZINE  100 mg Oral 3 times per day  . isosorbide mononitrate  60 mg Oral Daily  . metolazone  5 mg Oral BID  . NIFEdipine  90 mg Oral Daily  . oxybutynin  5 mg Oral Daily  . sevelamer carbonate  800 mg Oral TID WC  . sodium chloride  3 mL Intravenous Q12H  . valACYclovir  500 mg Oral BID   Continuous Infusions:    Charlynne Cousins  [facility] Pager [phone]. If 8PM-8AM, please contact night-coverage at [email or web address], password [facility] 03/12/2014, 7:50 AM  LOS: 1 day    **Disclaimer: This note may have been dictated with voice recognition software. Similar sounding words can inadvertently be transcribed and this note may contain transcription errors which may not have been corrected upon publication of note.**

## 2014-03-12 NOTE — Consult Note (Signed)
[name] is an 73 y.o. female referred by Dr Olevia Bowens   Chief Complaint: ESRD, anemia, Sec HPTH HPI: 73yo BF with ESRD sec MM recently moved here from [place] and admitted yest because of a low Hg.  Reportedly she had BRBPR by exam though pt denies seeing any blood in stools.  She dialyses at [place] and last HD was yest.  Past Medical History  Diagnosis Date  . Renal disorder   . ESRD (end stage renal disease)   . Hypertension   . Multiple myeloma 2009  . Thyroid disease   . Hyperparathyroidism   . Thrombocytopenia   . Anemia   . Shortness of breath   . Pneumonia   . GERD (gastroesophageal reflux disease)   . Arthritis     rt knee is stiff    History reviewed. No pertinent past surgical history.  History reviewed. No pertinent family history. Social History:  reports that she has never smoked. She has never used smokeless tobacco. She reports that she does not drink alcohol or use illicit drugs.  Lives with daughter in [place]  No FH renal Ds Allergies: No Known Allergies  Medications Prior to Admission  Medication Sig Dispense Refill  . acetaminophen-codeine (TYLENOL #3) 300-30 MG per tablet Take by mouth every 4 (four) hours as needed for moderate pain.      . cyproheptadine (PERIACTIN) 4 MG tablet Take 4 mg by mouth 2 (two) times daily.      Marland Kitchen dextromethorphan-guaiFENesin (ROBITUSSIN-DM) 10-100 MG/5ML liquid Take 10 mLs by mouth 3 (three) times daily as needed for cough.      . hydrALAZINE (APRESOLINE) 100 MG tablet Take 100 mg by mouth every 8 (eight) hours.      . isosorbide mononitrate (IMDUR) 60 MG 24 hr tablet Take 60 mg by mouth daily.      . metolazone (ZAROXOLYN) 5 MG tablet Take 5 mg by mouth 2 (two) times daily.      . Multiple Vitamins-Iron (DAILY-VITE/IRON PO) Take 1 tablet by mouth daily.      Marland Kitchen NIFEdipine (PROCARDIA XL/ADALAT-CC) 90 MG 24 hr tablet Take 90 mg by mouth daily.      Marland Kitchen oxybutynin (DITROPAN) 5 MG tablet Take 5 mg by mouth daily.      .  promethazine-codeine (PHENERGAN WITH CODEINE) 6.25-10 MG/5ML syrup Take 5 mLs by mouth every 6 (six) hours as needed for cough.      . sevelamer carbonate (RENVELA) 800 MG tablet Take 800 mg by mouth 3 (three) times daily with meals.      . valACYclovir (VALTREX) 500 MG tablet Take 500 mg by mouth 2 (two) times daily.         Lab Results: UA: ND   Recent Labs  03/11/14 1658 03/12/14 0655  WBC 2.8* 4.2  HGB 5.9* 6.4*  HCT 19.2* 20.3*  PLT 76* 80*   BMET  Recent Labs  03/11/14 1658 03/12/14 0655  NA 129* 130*  K 4.4 4.4  CL 96 96  CO2 26 25  GLUCOSE 81 82  BUN 25* 32*  CREATININE 6.08* 7.72*  CALCIUM 8.8 8.8   LFT  Recent Labs  03/11/14 1658  PROT >15.0*  ALBUMIN 2.6*  AST 29  ALT 11  ALKPHOS 37*  BILITOT 0.4   Dg Bone Survey Met  03/12/2014   CLINICAL DATA:  Myeloma.  Right knee pain.  EXAM: METASTATIC BONE SURVEY  COMPARISON:  None.  FINDINGS: Multiple images are performed of  the axial and appendicular skeleton.  Multiple small lytic lesions are identified within the skull consistent with myeloma. A small lytic lesion is identified within the right scapula.  Degenerative changes are identified within the mid cervical spine. No suspicious lytic or blastic lesions are identified within the spine, or long bones. Surgical clips are identified in the left upper arm. Degenerative changes are seen in the knees bilaterally, right greater than left.  The heart is mildly enlarged. There are areas of atelectasis within the right lung. No focal consolidations or pulmonary edema.  IMPRESSION: 1. Small lytic lesions within the skull and right scapula. 2. Degenerative changes in the spine and knees, right greater than left. 3. Cardiomegaly without pulmonary edema.   Electronically Signed   By: Shon Hale M.D.   On: 03/12/2014 10:13    ROS: No change in vision No CP No SOB No abd pain No arthritic CO No neuropathic sxs No dysuria  PHYSICAL EXAM: Blood pressure 142/79, pulse  71, temperature 99.1 F (37.3 C), temperature source Oral, resp. rate 18, height '5\' 7"'  (1.702 m), weight 63.322 kg (139 lb 9.6 oz), SpO2 92.00%. HEENT: PERRLA EOMI NECK:no JVD LUNGS:clear CARDIAC:RRR wo MRG ABD:+ BS NTND No HSM EXT:no edema  LUA AVF + bruit NEURO:CNI  M&SI OX3 no asterixis  Assessment: 1. Severe anemia ? GIB vs MM 2. HTN 3. Sec HPTH 4. MM 5. ESRD PLAN: 1. Cont BP meds 2. Resume EPO 3. Resume hectorol 4. Plan HD in AM and can transfuse if needed at HD 5. I do not have zaroxalyn on our med list and ? Why it is needed 6.  Start renavite   [name] T 03/12/2014, 10:30 AM

## 2014-03-12 NOTE — Progress Notes (Signed)
Utilization review completed. Rosa Gambale, RN, BSN. 

## 2014-03-13 LAB — FERRITIN: Ferritin: 2786 ng/mL — ABNORMAL HIGH (ref 10–291)

## 2014-03-13 LAB — RENAL FUNCTION PANEL
ALBUMIN: 2.6 g/dL — AB (ref 3.5–5.2)
BUN: 38 mg/dL — ABNORMAL HIGH (ref 6–23)
CALCIUM: 9.3 mg/dL (ref 8.4–10.5)
CO2: 19 meq/L (ref 19–32)
Chloride: 92 mEq/L — ABNORMAL LOW (ref 96–112)
Creatinine, Ser: 8.83 mg/dL — ABNORMAL HIGH (ref 0.50–1.10)
GFR calc non Af Amer: 4 mL/min — ABNORMAL LOW (ref >90)
GFR, EST AFRICAN AMERICAN: 5 mL/min — AB (ref >90)
Glucose, Bld: 103 mg/dL — ABNORMAL HIGH (ref 70–99)
PHOSPHORUS: 6.7 mg/dL — AB (ref 2.3–4.6)
Potassium: 4.1 mEq/L (ref 3.7–5.3)
SODIUM: 125 meq/L — AB (ref 137–147)

## 2014-03-13 LAB — TYPE AND SCREEN
ABO/RH(D): O POS
ANTIBODY SCREEN: NEGATIVE
Unit division: 0
Unit division: 0

## 2014-03-13 LAB — IRON AND TIBC
Iron: 149 ug/dL — ABNORMAL HIGH (ref 42–135)
UIBC: <15 ug/dL — ABNORMAL LOW (ref 125–400)

## 2014-03-13 LAB — CBC
HCT: 26.9 % — ABNORMAL LOW (ref 36.0–46.0)
Hemoglobin: 8.4 g/dL — ABNORMAL LOW (ref 12.0–15.0)
MCH: 28.9 pg (ref 26.0–34.0)
MCHC: 31.2 g/dL (ref 30.0–36.0)
MCV: 92.4 fL (ref 78.0–100.0)
PLATELETS: 82 10*3/uL — AB (ref 150–400)
RBC: 2.91 MIL/uL — AB (ref 3.87–5.11)
RDW: 19.2 % — AB (ref 11.5–15.5)
WBC: 5.6 10*3/uL (ref 4.0–10.5)

## 2014-03-13 MED ORDER — DOXERCALCIFEROL 4 MCG/2ML IV SOLN
INTRAVENOUS | Status: AC
  Administered 2014-03-13: 2 ug
  Filled 2014-03-13: qty 2

## 2014-03-13 MED ORDER — LORAZEPAM 0.5 MG PO TABS
0.5000 mg | ORAL_TABLET | Freq: Once | ORAL | Status: AC
  Administered 2014-03-13: 0.5 mg via ORAL
  Filled 2014-03-13: qty 1

## 2014-03-13 MED ORDER — DARBEPOETIN ALFA-POLYSORBATE 200 MCG/0.4ML IJ SOLN
INTRAMUSCULAR | Status: AC
  Filled 2014-03-13: qty 0.4

## 2014-03-13 NOTE — ED Provider Notes (Signed)
Medical screening examination/treatment/procedure(s) were conducted as a shared visit with non-physician practitioner(s) and myself.  I personally evaluated the patient during the encounter. Pt presents w/ low Hb from outpt lab, complains of fatigue, weakness. Hb here 5.9, with heme + stool. Unclear if anemia due to GI bleed, MM or more likely combination. Blood transfusion initiated in the ED. Pt admitted to [facility].    EKG Interpretation   Date/Time:  Thursday March 11 2014 16:35:41 EDT Ventricular Rate:  66 PR Interval:  181 QRS Duration: 86 QT Interval:  477 QTC Calculation: 500 R Axis:   -3 Text Interpretation:  Sinus rhythm Borderline T wave abnormalities  Borderline prolonged QT interval No prior for comparison Confirmed by  DOCHERTY  MD, MEGAN [phone]) on 03/11/2014 7:34:00 PM       CRITICAL CARE Performed by: Ernestina Patches, E Total critical care time: 30 Critical care time was exclusive of separately billable procedures and treating other patients. Critical care was necessary to treat or prevent imminent or life-threatening deterioration. Critical care was time spent personally by me on the following activities: development of treatment plan with patient and/or surrogate as well as nursing, discussions with consultants, evaluation of patient's response to treatment, examination of patient, obtaining history from patient or surrogate, ordering and performing treatments and interventions, ordering and review of laboratory studies, ordering and review of radiographic studies, pulse oximetry and re-evaluation of patient's condition.   Carrie Ehlers, MD 03/13/14 1204

## 2014-03-13 NOTE — Progress Notes (Signed)
Subjective:   Feeling well, no complaints. Denies bleeding  Objective Filed Vitals:   03/12/14 1535 03/12/14 1654 03/12/14 2133 03/13/14 0617  BP: 153/82 150/79 149/80 149/79  Pulse: 59 61 67 68  Temp: 98.6 F (37 C) 98.6 F (37 C) 98.4 F (36.9 C) 98.1 F (36.7 C)  TempSrc:  Oral Oral Oral  Resp: '20 20 16 16  ' Height:   '5\' 7"'  (1.702 m)   Weight:   65.046 kg (143 lb 6.4 oz)   SpO2: 93% 94% 95% 95%   Physical Exam General:  Drowsy, arousable. No acute distress. Heart: RRR Lungs: CTA, unlabored Abdomen: soft, nontender +BS Extremities: no edema Dialysis Access: L AVF +bruit/thrill  Assessment/Plan: 1. Anemia- hgb 8.4- from 6.4 s/p2u RBCs- watch CBC, tranfuse PRN. FOBT + 2. ESRD - TTS [place], HD pending today. K+4.1 4. Secondary hyperparathyroidism - ca+ 9.3 phos 6.7. Cont hectorol and renvela 5. HTN/volume - 149/79. No volume excess 6. Nutrition - renal diet. Multi vit 7. Multiple myeloma  Shelle Iron, NP [place] [phone] 03/13/2014,9:25 AM  LOS: 2 days    Additional Objective Labs: Basic Metabolic Panel:  Recent Labs Lab 03/11/14 1658 03/12/14 0655 03/12/14 1800 03/13/14 0442  NA 129* 130*  --  125*  K 4.4 4.4  --  4.1  CL 96 96  --  92*  CO2 26 25  --  19  GLUCOSE 81 82  --  103*  BUN 25* 32*  --  38*  CREATININE 6.08* 7.72*  --  8.83*  CALCIUM 8.8 8.8  --  9.3  PHOS  --   --  6.5* 6.7*   Liver Function Tests:  Recent Labs Lab 03/11/14 1658 03/13/14 0442  AST 29  --   ALT 11  --   ALKPHOS 37*  --   BILITOT 0.4  --   PROT >15.0*  --   ALBUMIN 2.6* 2.6*   No results found for this basename: LIPASE, AMYLASE,  in the last 168 hours CBC:  Recent Labs Lab 03/11/14 1658 03/12/14 0655 03/13/14 0442  WBC 2.8* 4.2 5.6  NEUTROABS 1.6*  --   --   HGB 5.9* 6.4* 8.4*  HCT 19.2* 20.3* 26.9*  MCV 93.7 93.1 92.4  PLT 76* 80* 82*   Blood Culture No results found for this basename: sdes, specrequest, cult, reptstatus     Cardiac Enzymes:  Recent Labs Lab 03/11/14 2218  TROPONINI <0.30   CBG: No results found for this basename: GLUCAP,  in the last 168 hours Iron Studies: No results found for this basename: IRON, TIBC, TRANSFERRIN, FERRITIN,  in the last 72 hours '@[email or web address]' @ Studies/Results: Dg Bone Survey Met  03/12/2014   CLINICAL DATA:  Myeloma.  Right knee pain.  EXAM: METASTATIC BONE SURVEY  COMPARISON:  None.  FINDINGS: Multiple images are performed of the axial and appendicular skeleton.  Multiple small lytic lesions are identified within the skull consistent with myeloma. A small lytic lesion is identified within the right scapula.  Degenerative changes are identified within the mid cervical spine. No suspicious lytic or blastic lesions are identified within the spine, or long bones. Surgical clips are identified in the left upper arm. Degenerative changes are seen in the knees bilaterally, right greater than left.  The heart is mildly enlarged. There are areas of atelectasis within the right lung. No focal consolidations or pulmonary edema.  IMPRESSION: 1. Small lytic lesions within the skull and right scapula. 2. Degenerative changes in the spine  and knees, right greater than left. 3. Cardiomegaly without pulmonary edema.   Electronically Signed   By: Shon Hale M.D.   On: 03/12/2014 10:13   Medications:   . cyproheptadine  4 mg Oral BID  . darbepoetin (ARANESP) injection - DIALYSIS  200 mcg Intravenous Q Sat-HD  . doxercalciferol  2 mcg Intravenous Q T,Th,Sa-HD  . hydrALAZINE  100 mg Oral 3 times per day  . isosorbide mononitrate  60 mg Oral Daily  . metolazone  5 mg Oral BID  . multivitamin  1 tablet Oral QHS  . NIFEdipine  90 mg Oral Daily  . oxybutynin  5 mg Oral Daily  . sevelamer carbonate  800 mg Oral TID WC  . sodium chloride  3 mL Intravenous Q12H  . valACYclovir  500 mg Oral Daily   I have seen and examined this patient and agree with plan per Shelle Iron. Hg up to 8.4  post transfusion.  No new CO.  Will plan HD today. Mishal Probert T,MD 03/13/2014 9:42 AM

## 2014-03-13 NOTE — Consult Note (Signed)
GI Consultation  Referring Provider: No ref. provider found Primary Care Physician:  No primary provider on file. Primary Gastroenterologist:  Dr.  Luiz Iron for Consultation:  *Anemia**  HPI: [name] is a 73 y.o. female with multiple myeloma, end-stage renal disease, on hemodialysis, hypertension, admitted for anemia.  Patient denies rectal bleeding, change of bowel habits or melena.  She apparently underwent colonoscopy about a year and a half ago and claims the results were unremarkable.*On admission hemoglobin 6.4.  Rectal exam blood was noted on the glove.  She denies rectal pain.*   Past Medical History  Diagnosis Date  . Renal disorder   . ESRD (end stage renal disease)   . Hypertension   . Multiple myeloma 2009  . Thyroid disease   . Hyperparathyroidism   . Thrombocytopenia   . Anemia   . Shortness of breath   . Pneumonia   . GERD (gastroesophageal reflux disease)   . Arthritis     rt knee is stiff    History reviewed. No pertinent past surgical history.  Prior to Admission medications   Medication Sig Start Date End Date Taking? Authorizing Provider  acetaminophen-codeine (TYLENOL #3) 300-30 MG per tablet Take by mouth every 4 (four) hours as needed for moderate pain.   Yes Historical Provider, MD  cyproheptadine (PERIACTIN) 4 MG tablet Take 4 mg by mouth 2 (two) times daily.   Yes Historical Provider, MD  dextromethorphan-guaiFENesin (ROBITUSSIN-DM) 10-100 MG/5ML liquid Take 10 mLs by mouth 3 (three) times daily as needed for cough.   Yes Historical Provider, MD  hydrALAZINE (APRESOLINE) 100 MG tablet Take 100 mg by mouth every 8 (eight) hours.   Yes Historical Provider, MD  isosorbide mononitrate (IMDUR) 60 MG 24 hr tablet Take 60 mg by mouth daily.   Yes Historical Provider, MD  metolazone (ZAROXOLYN) 5 MG tablet Take 5 mg by mouth 2 (two) times daily.   Yes Historical Provider, MD  Multiple Vitamins-Iron (DAILY-VITE/IRON PO) Take 1 tablet by mouth  daily.   Yes Historical Provider, MD  NIFEdipine (PROCARDIA XL/ADALAT-CC) 90 MG 24 hr tablet Take 90 mg by mouth daily.   Yes Historical Provider, MD  oxybutynin (DITROPAN) 5 MG tablet Take 5 mg by mouth daily.   Yes Historical Provider, MD  promethazine-codeine (PHENERGAN WITH CODEINE) 6.25-10 MG/5ML syrup Take 5 mLs by mouth every 6 (six) hours as needed for cough.   Yes Historical Provider, MD  sevelamer carbonate (RENVELA) 800 MG tablet Take 800 mg by mouth 3 (three) times daily with meals.   Yes Historical Provider, MD  valACYclovir (VALTREX) 500 MG tablet Take 500 mg by mouth 2 (two) times daily.   Yes Historical Provider, MD    Current Facility-Administered Medications  Medication Dose Route Frequency Provider Last Rate Last Dose  . acetaminophen-codeine (TYLENOL #3) 300-30 MG per tablet 1 tablet  1 tablet Oral Q4H PRN Etta Quill, DO   1 tablet at 03/12/14 2345  . cyproheptadine (PERIACTIN) 4 MG tablet 4 mg  4 mg Oral BID Etta Quill, DO   4 mg at 03/12/14 2201  . darbepoetin (ARANESP) injection 200 mcg  200 mcg Intravenous Q Sat-HD Windy Kalata, MD      . doxercalciferol (HECTOROL) injection 2 mcg  2 mcg Intravenous Q T,Th,Sa-HD Windy Kalata, MD      . guaiFENesin-dextromethorphan [facility] DM) 100-10 MG/5ML syrup 10 mL  10 mL Oral TID PRN Etta Quill, DO      .  hydrALAZINE (APRESOLINE) tablet 100 mg  100 mg Oral 3 times per day Etta Quill, DO   100 mg at 03/12/14 2201  . isosorbide mononitrate (IMDUR) 24 hr tablet 60 mg  60 mg Oral Daily Etta Quill, DO   60 mg at 03/12/14 1034  . metolazone (ZAROXOLYN) tablet 5 mg  5 mg Oral BID Etta Quill, DO   5 mg at 03/12/14 2201  . multivitamin (RENA-VIT) tablet 1 tablet  1 tablet Oral QHS Windy Kalata, MD   1 tablet at 03/12/14 2201  . NIFEdipine (PROCARDIA XL/ADALAT-CC) 24 hr tablet 90 mg  90 mg Oral Daily Etta Quill, DO   90 mg at 03/12/14 1219  . oxybutynin (DITROPAN) tablet 5 mg  5 mg Oral  Daily Etta Quill, DO   5 mg at 03/12/14 1034  . promethazine-codeine (PHENERGAN with CODEINE) 6.25-10 MG/5ML syrup 5 mL  5 mL Oral Q6H PRN Etta Quill, DO   5 mL at 03/13/14 0055  . sevelamer carbonate (RENVELA) tablet 800 mg  800 mg Oral TID WC Etta Quill, DO   800 mg at 03/12/14 1724  . sodium chloride 0.9 % injection 3 mL  3 mL Intravenous Q12H Etta Quill, DO   3 mL at 03/13/14 0948  . valACYclovir (VALTREX) tablet 500 mg  500 mg Oral Daily Volanda Napoleon, MD        Allergies as of 03/11/2014  . (No Known Allergies)    History reviewed. No pertinent family history.  History   Social History  . Marital Status: Married    Spouse Name: N/A    Number of Children: N/A  . Years of Education: N/A   Occupational History  . Not on file.   Social History Main Topics  . Smoking status: Never Smoker   . Smokeless tobacco: Never Used  . Alcohol Use: No  . Drug Use: No  . Sexual Activity: No   Other Topics Concern  . Not on file   Social History Narrative  . No narrative on file    Review of Systems: Pertinent positive and negative review of systems were noted in the above history of present illness section. All other review of systems were otherwise negative   Physical Exam: Vital signs in last 24 hours: Temp:  [98.1 F (36.7 C)-98.6 F (37 C)] 98.4 F (36.9 C) (06/13 0943) Pulse Rate:  [59-68] 66 (06/13 0943) Resp:  [16-20] 16 (06/13 0943) BP: (149-165)/(79-84) 155/84 mmHg (06/13 0943) SpO2:  [91 %-96 %] 91 % (06/13 0943) Weight:  [143 lb 6.4 oz (65.046 kg)] 143 lb 6.4 oz (65.046 kg) (06/12 2133)  General:   He was uncooperative refused physical exam  Intake/Output from previous day: 06/12 0701 - 06/13 0700 In: 1230.5 [P.O.:680; I.V.:253; Blood:297.5] Out: -  Intake/Output this shift: Total I/O In: 120 [P.O.:120] Out: -   Lab Results:  Recent Labs  03/11/14 1658 03/12/14 0655 03/13/14 0442  WBC 2.8* 4.2 5.6  HGB 5.9* 6.4* 8.4*  HCT  19.2* 20.3* 26.9*  PLT 76* 80* 82*   BMET  Recent Labs  03/11/14 1658 03/12/14 0655 03/13/14 0442  NA 129* 130* 125*  K 4.4 4.4 4.1  CL 96 96 92*  CO2 '26 25 19  ' GLUCOSE 81 82 103*  BUN 25* 32* 38*  CREATININE 6.08* 7.72* 8.83*  CALCIUM 8.8 8.8 9.3   LFT  Recent Labs  03/11/14 1658 03/13/14 0442  PROT >15.0*  --  ALBUMIN 2.6* 2.6*  AST 29  --   ALT 11  --   ALKPHOS 37*  --   BILITOT 0.4  --    PT/INR No results found for this basename: LABPROT, INR,  in the last 72 hours Hepatitis Panel No results found for this basename: HEPBSAG, HCVAB, HEPAIGM, HEPBIGM,  in the last 72 hours Additional Labs   Studies/Results: Dg Bone Survey Met  03/12/2014   CLINICAL DATA:  Myeloma.  Right knee pain.  EXAM: METASTATIC BONE SURVEY  COMPARISON:  None.  FINDINGS: Multiple images are performed of the axial and appendicular skeleton.  Multiple small lytic lesions are identified within the skull consistent with myeloma. A small lytic lesion is identified within the right scapula.  Degenerative changes are identified within the mid cervical spine. No suspicious lytic or blastic lesions are identified within the spine, or long bones. Surgical clips are identified in the left upper arm. Degenerative changes are seen in the knees bilaterally, right greater than left.  The heart is mildly enlarged. There are areas of atelectasis within the right lung. No focal consolidations or pulmonary edema.  IMPRESSION: 1. Small lytic lesions within the skull and right scapula. 2. Degenerative changes in the spine and knees, right greater than left. 3. Cardiomegaly without pulmonary edema.   Electronically Signed   By: Shon Hale M.D.   On: 03/12/2014 10:13    Principal Problem:   Pancytopenia Active Problems:   ESRD (end stage renal disease)   Multiple myeloma   HTN (hypertension)     IMPRESSION:   *1.  Anemia.  This is probably multifactorial including the need for multiple myeloma and chronic  renal disease.  She's had no overt GI bleeding rendering an acute GI bleed unlikely 2.  multiple myeloma/end-stage renal disease**    PLAN:     *No plans for immediate GI studies.  Check results of colonoscopy**          Zanayah Shadowens D. Deatra Ina, MD, [facility] [phone]    03/13/2014, 11:44 AM

## 2014-03-13 NOTE — Progress Notes (Signed)
Dtr at bedside wanted to speak with MD. Explained to her that they make rounds during the day. " I cant be here in the morning."  Told her MD can give her a call instead. [name] ([phone]. Waiting for Patient to come back from HD.

## 2014-03-13 NOTE — Progress Notes (Signed)
[facility] PROGRESS NOTE Interim History: 73 y.o. female with ESRD, dialized earlier today. Received phone call after dialysis stating that her HGB is low so she presented to ED. She is asymptomatic at this time, denies bleeding from anywhere subjectively (objectively she has BRBPR on rectal exam). No blood loss today at dialysis     Assessment/Plan: Severe anemia/generalized weakness: - S/p 3 unitsPRBC. Hbg 8.4 - FOBT positive. Rectal exam showed brown stool. She denies BRBPR. - The most likely cause of her pancytopenia is her MM which she is not on treatment.  - Consult hematology recommended GI evaluation adn Bone marrow Biopsy.   Multiple myeloma/Thrombocytopenia /Pancytopenia - cont to monitor. - albumin protein dissociation. - consult hematology will need follow up as an outpatient.  - consult IR for Bone marrow Biopsy.  ESRD (end stage renal disease) - TTS, will consult renal for HD in on Saturday. - ESRD is mot likely due to MM, getting records from her oncologist.  HTN (hypertension) - cont home meds.    Code Status: Full  Family Communication: Family at bedside  Disposition Plan: Admit to inpatient     Consultants:  renal  Procedures:  FOBT:+  Antibiotics:  none  HPI/Subjective: Feels weak.  Objective: Filed Vitals:   03/12/14 1654 03/12/14 2133 03/13/14 0617 03/13/14 0943  BP: 150/79 149/80 149/79 155/84  Pulse: 61 67 68 66  Temp: 98.6 F (37 C) 98.4 F (36.9 C) 98.1 F (36.7 C) 98.4 F (36.9 C)  TempSrc: Oral Oral Oral Oral  Resp: '20 16 16 16  ' Height:  '5\' 7"'  (1.702 m)    Weight:  65.046 kg (143 lb 6.4 oz)    SpO2: 94% 95% 95% 91%    Intake/Output Summary (Last 24 hours) at 03/13/14 1025 Last data filed at 03/13/14 0900  Gross per 24 hour  Intake 1150.5 ml  Output      0 ml  Net 1150.5 ml   Filed Weights   03/11/14 2041 03/12/14 2133  Weight: 63.322 kg (139 lb 9.6 oz) 65.046 kg (143 lb 6.4 oz)    Exam:  General:  Alert, awake, oriented x3, in no acute distress.  HEENT: No bruits, no goiter. +JVD Heart: Regular rate and rhythm, without murmurs, rubs, gallops.  Lungs: Good air movement, clear Abdomen: Soft, nontender, nondistended, positive bowel sounds.    Data Reviewed: Basic Metabolic Panel:  Recent Labs Lab 03/11/14 1658 03/12/14 0655 03/12/14 1800 03/13/14 0442  NA 129* 130*  --  125*  K 4.4 4.4  --  4.1  CL 96 96  --  92*  CO2 26 25  --  19  GLUCOSE 81 82  --  103*  BUN 25* 32*  --  38*  CREATININE 6.08* 7.72*  --  8.83*  CALCIUM 8.8 8.8  --  9.3  PHOS  --   --  6.5* 6.7*   Liver Function Tests:  Recent Labs Lab 03/11/14 1658 03/13/14 0442  AST 29  --   ALT 11  --   ALKPHOS 37*  --   BILITOT 0.4  --   PROT >15.0*  --   ALBUMIN 2.6* 2.6*   No results found for this basename: LIPASE, AMYLASE,  in the last 168 hours No results found for this basename: AMMONIA,  in the last 168 hours CBC:  Recent Labs Lab 03/11/14 1658 03/12/14 0655 03/13/14 0442  WBC 2.8* 4.2 5.6  NEUTROABS 1.6*  --   --   HGB 5.9* 6.4*  8.4*  HCT 19.2* 20.3* 26.9*  MCV 93.7 93.1 92.4  PLT 76* 80* 82*   Cardiac Enzymes:  Recent Labs Lab 03/11/14 2218  TROPONINI <0.30   BNP (last 3 results) No results found for this basename: PROBNP,  in the last 8760 hours CBG: No results found for this basename: GLUCAP,  in the last 168 hours  No results found for this or any previous visit (from the past 240 hour(s)).   Studies: Dg Bone Survey Met  03/12/2014   CLINICAL DATA:  Myeloma.  Right knee pain.  EXAM: METASTATIC BONE SURVEY  COMPARISON:  None.  FINDINGS: Multiple images are performed of the axial and appendicular skeleton.  Multiple small lytic lesions are identified within the skull consistent with myeloma. A small lytic lesion is identified within the right scapula.  Degenerative changes are identified within the mid cervical spine. No suspicious lytic or blastic lesions are identified within  the spine, or long bones. Surgical clips are identified in the left upper arm. Degenerative changes are seen in the knees bilaterally, right greater than left.  The heart is mildly enlarged. There are areas of atelectasis within the right lung. No focal consolidations or pulmonary edema.  IMPRESSION: 1. Small lytic lesions within the skull and right scapula. 2. Degenerative changes in the spine and knees, right greater than left. 3. Cardiomegaly without pulmonary edema.   Electronically Signed   By: Shon Hale M.D.   On: 03/12/2014 10:13    Scheduled Meds: . cyproheptadine  4 mg Oral BID  . darbepoetin (ARANESP) injection - DIALYSIS  200 mcg Intravenous Q Sat-HD  . doxercalciferol  2 mcg Intravenous Q T,Th,Sa-HD  . hydrALAZINE  100 mg Oral 3 times per day  . isosorbide mononitrate  60 mg Oral Daily  . metolazone  5 mg Oral BID  . multivitamin  1 tablet Oral QHS  . NIFEdipine  90 mg Oral Daily  . oxybutynin  5 mg Oral Daily  . sevelamer carbonate  800 mg Oral TID WC  . sodium chloride  3 mL Intravenous Q12H  . valACYclovir  500 mg Oral Daily   Continuous Infusions:    Charlynne Cousins  [facility] Pager [phone]. If 8PM-8AM, please contact night-coverage at [email or web address], password [facility] 03/13/2014, 10:25 AM  LOS: 2 days    **Disclaimer: This note may have been dictated with voice recognition software. Similar sounding words can inadvertently be transcribed and this note may contain transcription errors which may not have been corrected upon publication of note.**

## 2014-03-13 NOTE — Progress Notes (Signed)
Received call from patient's daughter who was upset after having spoken with patient. Daughter stated that patient was not in her right mind with c/o nausea, vomiting, diarrhea following administration of an IV medication that the night nurse had not documented. This RN requested that daughter calm down. It was explained that all medication that patient had received was documented appropriately. The medication in question was Aredia which was prescribed by MD d/t increased calcium r/t multiple myeloma. It was further explained that the medication infused over 4 hours, and that common side effects include nausea, vomiting, diarrhea, and abd pain. Daughter seemed to understand a little better. She asked about visting patient. Daughter was informed that the patient had just been picked up for hemodialysis, and that visitation is not allowed in the acute unit. Daughter did not sound happy about this, but stated that she would visit patient later. Manya Silvas, RN

## 2014-03-14 LAB — CBC
HEMATOCRIT: 27.7 % — AB (ref 36.0–46.0)
HEMOGLOBIN: 8.6 g/dL — AB (ref 12.0–15.0)
MCH: 29.4 pg (ref 26.0–34.0)
MCHC: 31 g/dL (ref 30.0–36.0)
MCV: 94.5 fL (ref 78.0–100.0)
Platelets: 81 10*3/uL — ABNORMAL LOW (ref 150–400)
RBC: 2.93 MIL/uL — AB (ref 3.87–5.11)
RDW: 18.5 % — ABNORMAL HIGH (ref 11.5–15.5)
WBC: 3.6 10*3/uL — ABNORMAL LOW (ref 4.0–10.5)

## 2014-03-14 NOTE — Progress Notes (Signed)
Patient ID: [name], female   DOB: 09/23/1941, 73 y.o.   MRN: [id number] Request received from oncology for CT guided bone marrow biopsy in pt with history of multiple myeloma, initially diagnosed in [place] in 2009, who presented to hospital with severe anemia, positive hemoccult, thrombocytopenia and hyponatremia. Pt also has ESRD and on HD . Additional PMH as below. Pt has had BM bx's in past. Exam: pt awake/alert; chest- sl dim BS bases; heart- RRR; abd- soft,+BS,NT; ext- left arm AVF with good thrill/bruit, no edema.   Filed Vitals:   03/13/14 1930 03/13/14 1947 03/13/14 2042 03/14/14 0630  BP: 154/77 132/79 149/74 157/88  Pulse: 96 110 76 76  Temp:  98.7 F (37.1 C) 98.3 F (36.8 C) 98.5 F (36.9 C)  TempSrc:  Oral Oral Oral  Resp:  _0 Height:   _1  (1.702 m)   Weight:  141 lb 1.5 oz (64 kg) 142 lb 6.4 oz (64.592 kg)   SpO2:  96% 95% 93%   Past Medical History  Diagnosis Date  . Renal disorder   . ESRD (end stage renal disease)   . Hypertension   . Multiple myeloma 2009  . Thyroid disease   . Hyperparathyroidism   . Thrombocytopenia   . Anemia   . Shortness of breath   . Pneumonia   . GERD (gastroesophageal reflux disease)   . Arthritis     rt knee is stiff  History reviewed. No pertinent past surgical history. Dg Bone Survey Met  03/12/2014   CLINICAL DATA:  Myeloma.  Right knee pain.  EXAM: METASTATIC BONE SURVEY  COMPARISON:  None.  FINDINGS: Multiple images are performed of the axial and appendicular skeleton.  Multiple small lytic lesions are identified within the skull consistent with myeloma. A small lytic lesion is identified within the right scapula.  Degenerative changes are identified within the mid cervical spine. No suspicious lytic or blastic lesions are identified within the spine, or long bones. Surgical clips are identified in the left upper arm. Degenerative changes are seen in the knees bilaterally, right greater than left.  The heart is mildly  enlarged. There are areas of atelectasis within the right lung. No focal consolidations or pulmonary edema.  IMPRESSION: 1. Small lytic lesions within the skull and right scapula. 2. Degenerative changes in the spine and knees, right greater than left. 3. Cardiomegaly without pulmonary edema.   Electronically Signed   By: Shon Hale M.D.   On: 03/12/2014 10:13  Results for orders placed during the hospital encounter of 03/11/14  CBC WITH DIFFERENTIAL      Result Value Ref Range   WBC 2.8 (*) 4.0 - 10.5 K/uL   RBC 2.05 (*) 3.87 - 5.11 MIL/uL   Hemoglobin 5.9 (*) 12.0 - 15.0 g/dL   HCT 19.2 (*) 36.0 - 46.0 %   MCV 93.7  78.0 - 100.0 fL   MCH 28.8  26.0 - 34.0 pg   MCHC 30.7  30.0 - 36.0 g/dL   RDW 18.0 (*) 11.5 - 15.5 %   Platelets 76 (*) 150 - 400 K/uL   Neutrophils Relative % 57  43 - 77 %   Neutro Abs 1.6 (*) 1.7 - 7.7 K/uL   Lymphocytes Relative 29  12 - 46 %   Lymphs Abs 0.8  0.7 - 4.0 K/uL   Monocytes Relative 9  3 - 12 %   Monocytes Absolute 0.3  0.1 - 1.0 K/uL   Eosinophils Relative  4  0 - 5 %   Eosinophils Absolute 0.1  0.0 - 0.7 K/uL   Basophils Relative 0  0 - 1 %   Basophils Absolute 0.0  0.0 - 0.1 K/uL  COMPREHENSIVE METABOLIC PANEL      Result Value Ref Range   Sodium 129 (*) 137 - 147 mEq/L   Potassium 4.4  3.7 - 5.3 mEq/L   Chloride 96  96 - 112 mEq/L   CO2 26  19 - 32 mEq/L   Glucose, Bld 81  70 - 99 mg/dL   BUN 25 (*) 6 - 23 mg/dL   Creatinine, Ser 6.08 (*) 0.50 - 1.10 mg/dL   Calcium 8.8  8.4 - 10.5 mg/dL   Total Protein >15.0 (*) 6.0 - 8.3 g/dL   Albumin 2.6 (*) 3.5 - 5.2 g/dL   AST 29  0 - 37 U/L   ALT 11  0 - 35 U/L   Alkaline Phosphatase 37 (*) 39 - 117 U/L   Total Bilirubin 0.4  0.3 - 1.2 mg/dL   GFR calc non Af Amer 6 (*) >90 mL/min   GFR calc Af Amer 7 (*) >90 mL/min  TROPONIN I      Result Value Ref Range   Troponin I <0.30  <0.30 ng/mL  CBC      Result Value Ref Range   WBC 4.2  4.0 - 10.5 K/uL   RBC 2.18 (*) 3.87 - 5.11 MIL/uL   Hemoglobin  6.4 (*) 12.0 - 15.0 g/dL   HCT 20.3 (*) 36.0 - 46.0 %   MCV 93.1  78.0 - 100.0 fL   MCH 29.4  26.0 - 34.0 pg   MCHC 31.5  30.0 - 36.0 g/dL   RDW 18.7 (*) 11.5 - 15.5 %   Platelets 80 (*) 150 - 400 K/uL  BASIC METABOLIC PANEL      Result Value Ref Range   Sodium 130 (*) 137 - 147 mEq/L   Potassium 4.4  3.7 - 5.3 mEq/L   Chloride 96  96 - 112 mEq/L   CO2 25  19 - 32 mEq/L   Glucose, Bld 82  70 - 99 mg/dL   BUN 32 (*) 6 - 23 mg/dL   Creatinine, Ser 7.72 (*) 0.50 - 1.10 mg/dL   Calcium 8.8  8.4 - 10.5 mg/dL   GFR calc non Af Amer 5 (*) >90 mL/min   GFR calc Af Amer 5 (*) >90 mL/min  LACTATE DEHYDROGENASE      Result Value Ref Range   LDH 222  94 - 250 U/L  PHOSPHORUS      Result Value Ref Range   Phosphorus 6.5 (*) 2.3 - 4.6 mg/dL  CBC      Result Value Ref Range   WBC 5.6  4.0 - 10.5 K/uL   RBC 2.91 (*) 3.87 - 5.11 MIL/uL   Hemoglobin 8.4 (*) 12.0 - 15.0 g/dL   HCT 26.9 (*) 36.0 - 46.0 %   MCV 92.4  78.0 - 100.0 fL   MCH 28.9  26.0 - 34.0 pg   MCHC 31.2  30.0 - 36.0 g/dL   RDW 19.2 (*) 11.5 - 15.5 %   Platelets 82 (*) 150 - 400 K/uL  RENAL FUNCTION PANEL      Result Value Ref Range   Sodium 125 (*) 137 - 147 mEq/L   Potassium 4.1  3.7 - 5.3 mEq/L   Chloride 92 (*) 96 - 112 mEq/L   CO2 19  19 - 32 mEq/L   Glucose, Bld 103 (*) 70 - 99 mg/dL   BUN 38 (*) 6 - 23 mg/dL   Creatinine, Ser 8.83 (*) 0.50 - 1.10 mg/dL   Calcium 9.3  8.4 - 10.5 mg/dL   Phosphorus 6.7 (*) 2.3 - 4.6 mg/dL   Albumin 2.6 (*) 3.5 - 5.2 g/dL   GFR calc non Af Amer 4 (*) >90 mL/min   GFR calc Af Amer 5 (*) >90 mL/min  FERRITIN      Result Value Ref Range   Ferritin 2786 (*) 10 - 291 ng/mL  IRON AND TIBC      Result Value Ref Range   Iron 149 (*) 42 - 135 ug/dL   TIBC NOT CALC  250 - 470 ug/dL   Saturation Ratios NOT CALC  20 - 55 %   UIBC <15 (*) 125 - 400 ug/dL  POC OCCULT BLOOD, ED      Result Value Ref Range   Fecal Occult Bld POSITIVE (*) NEGATIVE  TYPE AND SCREEN      Result Value Ref  Range   ABO/RH(D) O POS     Antibody Screen NEG     Sample Expiration 03/14/2014     Unit Number [id number]     Blood Component Type RBC LR PHER2     Unit division 00     Status of Unit ISSUED,FINAL     Transfusion Status OK TO TRANSFUSE     Crossmatch Result Compatible     Unit Number [id number]     Blood Component Type RED CELLS,LR     Unit division 00     Status of Unit ISSUED,FINAL     Transfusion Status OK TO TRANSFUSE     Crossmatch Result Compatible    ABO/RH      Result Value Ref Range   ABO/RH(D) O POS    PREPARE RBC (CROSSMATCH)      Result Value Ref Range   Order Confirmation ORDER PROCESSED BY BLOOD BANK    PREPARE RBC (CROSSMATCH)      Result Value Ref Range   Order Confirmation ORDER PROCESSED BY BLOOD BANK     A/P: Pt with history of multiple myeloma, initially diagnosed in [place] in 2009, who presented to hospital with severe anemia, positive hemoccult, thrombocytopenia and hyponatremia. Tent plan is for CT guided bone marrow biopsy for restaging . Details/risks of procedure d/w pt with her understanding and consent. Pt is undecided on which date she wants bx at this time. Will prepare for procedure tomorrow and if pt prefers we can wait until 6/16 if needed.

## 2014-03-14 NOTE — Progress Notes (Signed)
When brought her am meds, pt became very argumentative and refused to take meds. Will attempt again later this am.

## 2014-03-14 NOTE — Progress Notes (Signed)
Subjective:   Feeling well, eating breakfast. No complaints  Objective Filed Vitals:   03/13/14 1930 03/13/14 1947 03/13/14 2042 03/14/14 0630  BP: 154/77 132/79 149/74 157/88  Pulse: 96 110 76 76  Temp:  98.7 F (37.1 C) 98.3 F (36.8 C) 98.5 F (36.9 C)  TempSrc:  Oral Oral Oral  Resp:  '15 16 15  ' Height:   '5\' 7"'  (1.702 m)   Weight:  64 kg (141 lb 1.5 oz) 64.592 kg (142 lb 6.4 oz)   SpO2:  96% 95% 93%   Physical Exam General: alert and oriented. No acute distress Heart: RRR Lungs: CTA, unlabored Abdomen: soft, nontender +BS Extremities: no edema Dialysis Access: L AVF +bruit/thrill  Assessment/Plan:  1. Anemia- hgb 8.4- from 6.4 s/p2u RBCs- cont aranesp 200 q Saturday watch CBC, tranfuse PRN. FOBT + . Ferritin 2786 2. ESRD - TTS [place], Next HD tuesday.   4. Secondary hyperparathyroidism - ca+ 9.3 phos 6.7. Cont hectorol and renvela  5. HTN/volume - 157/88. No volume excess  6. Nutrition - renal diet. Multi vit  7. Multiple myeloma- oncology consulted, work up pending   Shelle Iron, NP [facility] [phone] 03/14/2014,8:24 AM  LOS: 3 days    Additional Objective Labs: Basic Metabolic Panel:  Recent Labs Lab 03/11/14 1658 03/12/14 0655 03/12/14 1800 03/13/14 0442  NA 129* 130*  --  125*  K 4.4 4.4  --  4.1  CL 96 96  --  92*  CO2 26 25  --  19  GLUCOSE 81 82  --  103*  BUN 25* 32*  --  38*  CREATININE 6.08* 7.72*  --  8.83*  CALCIUM 8.8 8.8  --  9.3  PHOS  --   --  6.5* 6.7*   Liver Function Tests:  Recent Labs Lab 03/11/14 1658 03/13/14 0442  AST 29  --   ALT 11  --   ALKPHOS 37*  --   BILITOT 0.4  --   PROT >15.0*  --   ALBUMIN 2.6* 2.6*   No results found for this basename: LIPASE, AMYLASE,  in the last 168 hours CBC:  Recent Labs Lab 03/11/14 1658 03/12/14 0655 03/13/14 0442  WBC 2.8* 4.2 5.6  NEUTROABS 1.6*  --   --   HGB 5.9* 6.4* 8.4*  HCT 19.2* 20.3* 26.9*  MCV 93.7 93.1 92.4  PLT 76* 80* 82*   Blood  Culture No results found for this basename: sdes, specrequest, cult, reptstatus    Cardiac Enzymes:  Recent Labs Lab 03/11/14 2218  TROPONINI <0.30   CBG: No results found for this basename: GLUCAP,  in the last 168 hours Iron Studies:  Recent Labs  03/13/14 0442  IRON 149*  TIBC NOT CALC  FERRITIN 2786*   '@lablastinr3' @ Studies/Results: Dg Bone Survey Met  03/12/2014   CLINICAL DATA:  Myeloma.  Right knee pain.  EXAM: METASTATIC BONE SURVEY  COMPARISON:  None.  FINDINGS: Multiple images are performed of the axial and appendicular skeleton.  Multiple small lytic lesions are identified within the skull consistent with myeloma. A small lytic lesion is identified within the right scapula.  Degenerative changes are identified within the mid cervical spine. No suspicious lytic or blastic lesions are identified within the spine, or long bones. Surgical clips are identified in the left upper arm. Degenerative changes are seen in the knees bilaterally, right greater than left.  The heart is mildly enlarged. There are areas of atelectasis within the right lung. No  focal consolidations or pulmonary edema.  IMPRESSION: 1. Small lytic lesions within the skull and right scapula. 2. Degenerative changes in the spine and knees, right greater than left. 3. Cardiomegaly without pulmonary edema.   Electronically Signed   By: Shon Hale M.D.   On: 03/12/2014 10:13   Medications:   . cyproheptadine  4 mg Oral BID  . darbepoetin (ARANESP) injection - DIALYSIS  200 mcg Intravenous Q Sat-HD  . doxercalciferol  2 mcg Intravenous Q T,Th,Sa-HD  . hydrALAZINE  100 mg Oral 3 times per day  . isosorbide mononitrate  60 mg Oral Daily  . metolazone  5 mg Oral BID  . multivitamin  1 tablet Oral QHS  . NIFEdipine  90 mg Oral Daily  . oxybutynin  5 mg Oral Daily  . sevelamer carbonate  800 mg Oral TID WC  . sodium chloride  3 mL Intravenous Q12H  . valACYclovir  500 mg Oral Daily  I have seen and examined this  patient and agree with plan per Shelle Iron.  Did well with HD yest.  Note plans for BM bx by Heme.  Plan next HD tues.   Kristoph Sattler T,MD 03/14/2014 8:48 AM

## 2014-03-14 NOTE — Progress Notes (Signed)
[facility] PROGRESS NOTE Interim History: 73 y.o. female with ESRD, dialized earlier today. Received phone call after dialysis stating that her HGB is low so she presented to ED. She is asymptomatic at this time, denies bleeding from anywhere subjectively (objectively she has BRBPR on rectal exam). No blood loss today at dialysis     Assessment/Plan: Severe anemia/generalized weakness: - S/p 3 unitsPRBC. Hbg 8.4 - FOBT positive. Rectal exam showed brown stool. She denies BRBPR. - The most likely cause of her pancytopenia is her MM which she is not on treatment.  - GI recommended no further studies.   Multiple myeloma/Thrombocytopenia /Pancytopenia - cont to monitor. - albumin protein dissociation. - consult hematology will need follow up as an outpatient.  - consult IR for Bone marrow Biopsy on 6.15.2015  ESRD (end stage renal disease) - TTS, will consult renal for HD in on Saturday. - ESRD is mot likely due to MM, getting records from her oncologist.  HTN (hypertension) - cont home meds.    Code Status: Full  Family Communication: Family at bedside  Disposition Plan: Admit to inpatient     Consultants:  renal  Procedures:  FOBT:+  Antibiotics:  none  HPI/Subjective: Feels weak. No other complains  Objective: Filed Vitals:   03/13/14 1947 03/13/14 2042 03/14/14 0630 03/14/14 1012  BP: 132/79 149/74 157/88 144/82  Pulse: 110 76 76 64  Temp: 98.7 F (37.1 C) 98.3 F (36.8 C) 98.5 F (36.9 C) 98.7 F (37.1 C)  TempSrc: Oral Oral Oral Oral  Resp: _0 Height:  _1  (1.702 m)    Weight: 64 kg (141 lb 1.5 oz) 64.592 kg (142 lb 6.4 oz)    SpO2: 96% 95% 93% 95%    Intake/Output Summary (Last 24 hours) at 03/14/14 1102 Last data filed at 03/14/14 0900  Gross per 24 hour  Intake    800 ml  Output    500 ml  Net    300 ml   Filed Weights   03/13/14 1615 03/13/14 1947 03/13/14 2042  Weight: 64.5 kg (142 lb 3.2 oz) 64 kg (141 lb 1.5  oz) 64.592 kg (142 lb 6.4 oz)    Exam:  General: Alert, awake, oriented x3, in no acute distress.  HEENT: No bruits, no goiter. Still with +JVD Heart: Regular rate and rhythm, without murmurs, rubs, gallops.  Lungs: Good air movement, clear Abdomen: Soft, nontender, nondistended, positive bowel sounds.    Data Reviewed: Basic Metabolic Panel:  Recent Labs Lab 03/11/14 1658 03/12/14 0655 03/12/14 1800 03/13/14 0442  NA 129* 130*  --  125*  K 4.4 4.4  --  4.1  CL 96 96  --  92*  CO2 26 25  --  19  GLUCOSE 81 82  --  103*  BUN 25* 32*  --  38*  CREATININE 6.08* 7.72*  --  8.83*  CALCIUM 8.8 8.8  --  9.3  PHOS  --   --  6.5* 6.7*   Liver Function Tests:  Recent Labs Lab 03/11/14 1658 03/13/14 0442  AST 29  --   ALT 11  --   ALKPHOS 37*  --   BILITOT 0.4  --   PROT >15.0*  --   ALBUMIN 2.6* 2.6*   No results found for this basename: LIPASE, AMYLASE,  in the last 168 hours No results found for this basename: AMMONIA,  in the last 168 hours CBC:  Recent Labs Lab 03/11/14 1658 03/12/14 2353  03/13/14 0442 03/14/14 0916  WBC 2.8* 4.2 5.6 3.6*  NEUTROABS 1.6*  --   --   --   HGB 5.9* 6.4* 8.4* 8.6*  HCT 19.2* 20.3* 26.9* 27.7*  MCV 93.7 93.1 92.4 94.5  PLT 76* 80* 82* 81*   Cardiac Enzymes:  Recent Labs Lab 03/11/14 2218  TROPONINI <0.30   BNP (last 3 results) No results found for this basename: PROBNP,  in the last 8760 hours CBG: No results found for this basename: GLUCAP,  in the last 168 hours  No results found for this or any previous visit (from the past 240 hour(s)).   Studies: No results found.  Scheduled Meds: . cyproheptadine  4 mg Oral BID  . darbepoetin (ARANESP) injection - DIALYSIS  200 mcg Intravenous Q Sat-HD  . doxercalciferol  2 mcg Intravenous Q T,Th,Sa-HD  . hydrALAZINE  100 mg Oral 3 times per day  . isosorbide mononitrate  60 mg Oral Daily  . metolazone  5 mg Oral BID  . multivitamin  1 tablet Oral QHS  . NIFEdipine  90  mg Oral Daily  . oxybutynin  5 mg Oral Daily  . sevelamer carbonate  800 mg Oral TID WC  . sodium chloride  3 mL Intravenous Q12H  . valACYclovir  500 mg Oral Daily   Continuous Infusions:    Charlynne Cousins  [facility] Pager [phone]. If 8PM-8AM, please contact night-coverage at [email or web address], password [facility] 03/14/2014, 11:02 AM  LOS: 3 days    **Disclaimer: This note may have been dictated with voice recognition software. Similar sounding words can inadvertently be transcribed and this note may contain transcription errors which may not have been corrected upon publication of note.**

## 2014-03-15 LAB — CBC
HEMATOCRIT: 22.7 % — AB (ref 36.0–46.0)
HEMOGLOBIN: 7 g/dL — AB (ref 12.0–15.0)
MCH: 29.2 pg (ref 26.0–34.0)
MCHC: 30.8 g/dL (ref 30.0–36.0)
MCV: 94.6 fL (ref 78.0–100.0)
Platelets: 70 10*3/uL — ABNORMAL LOW (ref 150–400)
RBC: 2.4 MIL/uL — ABNORMAL LOW (ref 3.87–5.11)
RDW: 18.3 % — ABNORMAL HIGH (ref 11.5–15.5)
WBC: 3.6 10*3/uL — ABNORMAL LOW (ref 4.0–10.5)

## 2014-03-15 LAB — KAPPA/LAMBDA LIGHT CHAINS
Kappa free light chain: 1.25 mg/dL (ref 0.33–1.94)
Kappa, lambda light chain ratio: 0 — ABNORMAL LOW (ref 0.26–1.65)
LAMDA FREE LIGHT CHAINS: 1260 mg/dL — AB (ref 0.57–2.63)

## 2014-03-15 LAB — PROTIME-INR
INR: 1.39 (ref 0.00–1.49)
Prothrombin Time: 16.7 seconds — ABNORMAL HIGH (ref 11.6–15.2)

## 2014-03-15 LAB — APTT: aPTT: 36 seconds (ref 24–37)

## 2014-03-15 LAB — PREPARE RBC (CROSSMATCH)

## 2014-03-15 NOTE — Progress Notes (Signed)
[name]   DOB:May 24, 1941   FU#:[id number]   KGU#:[id number]  Subjective: Events since 5/12 noted.She is dialyzing without difficulty, last procedure dione today. Denies any fever chills night sweats. No shortness of breath. No cardiac complaints. Denies macroscopic or loss. She remains anxious and intermittently tearful. Denies bone pain.    Scheduled Meds: . cyproheptadine  4 mg Oral BID  . darbepoetin (ARANESP) injection - DIALYSIS  200 mcg Intravenous Q Sat-HD  . doxercalciferol  2 mcg Intravenous Q T,Th,Sa-HD  . hydrALAZINE  100 mg Oral 3 times per day  . isosorbide mononitrate  60 mg Oral Daily  . metolazone  5 mg Oral BID  . multivitamin  1 tablet Oral QHS  . NIFEdipine  90 mg Oral Daily  . oxybutynin  5 mg Oral Daily  . sevelamer carbonate  800 mg Oral TID WC  . sodium chloride  3 mL Intravenous Q12H  . valACYclovir  500 mg Oral Daily   Continuous Infusions:  PRN Meds:.acetaminophen-codeine, guaiFENesin-dextromethorphan, promethazine-codeine   Objective:  Filed Vitals:   03/15/14 1048  BP:   Pulse:   Temp: 98.4 F (36.9 C)  Resp:     Body mass index is 22.71 kg/(m^2).  Intake/Output Summary (Last 24 hours) at 03/15/14 1246 Last data filed at 03/15/14 1039  Gross per 24 hour  Intake    600 ml  Output      0 ml  Net    600 ml     GENERAL: alert, no distress anxious, tearful at times SKIN: skin color, texture, turgor are normal, no rashes or significant lesions  EYES: normal, conjunctiva are pink and non-injected, sclera clear  OROPHARYNX:no exudate, no erythema and lips, buccal mucosa, and tongue normal . Poor dentition  NECK: supple, thyroid normal size, non-tender, without nodularity  LYMPH: no palpable lymphadenopathy in the cervical, axillary or inguinal area  LUNGS: clear to auscultation and percussion with normal breathing effort  HEART: regular rate & rhythm and no murmurs and no lower extremity edema. Left upper extremity AVF  ABDOMEN: abdomen soft,  non-tender and normal bowel sounds  Musculoskeletal:no cyanosis of digits and no clubbing . Frequent cramps in feet.  PSYCH: alert & oriented x 3 with fluent speech  NEURO: no focal motor/sensory deficits   Labs:   Recent Labs Lab 03/11/14 1658 03/12/14 0655 03/13/14 0442 03/14/14 0916 03/15/14 0530  WBC 2.8* 4.2 5.6 3.6* 3.6*  HGB 5.9* 6.4* 8.4* 8.6* 7.0*  HCT 19.2* 20.3* 26.9* 27.7* 22.7*  PLT 76* 80* 82* 81* 70*  MCV 93.7 93.1 92.4 94.5 94.6  MCH 28.8 29.4 28.9 29.4 29.2  MCHC 30.7 31.5 31.2 31.0 30.8  RDW 18.0* 18.7* 19.2* 18.5* 18.3*  LYMPHSABS 0.8  --   --   --   --   MONOABS 0.3  --   --   --   --   EOSABS 0.1  --   --   --   --   BASOSABS 0.0  --   --   --   --      Chemistries:    Recent Labs Lab 03/11/14 1658 03/12/14 0655 03/13/14 0442  NA 129* 130* 125*  K 4.4 4.4 4.1  CL 96 96 92*  CO2 '26 25 19  ' GLUCOSE 81 82 103*  BUN 25* 32* 38*  CREATININE 6.08* 7.72* 8.83*  CALCIUM 8.8 8.8 9.3  AST 29  --   --   ALT 11  --   --   ALKPHOS 37*  --   --  BILITOT 0.4  --   --     Liver Function Tests:  Recent Labs Lab 03/11/14 1658 03/13/14 0442  AST 29  --   ALT 11  --   ALKPHOS 37*  --   BILITOT 0.4  --   PROT >15.0*  --   ALBUMIN 2.6* 2.6*    Coagulation profile  Recent Labs Lab 03/15/14 0530  INR 1.39    Cardiac Enzymes:  Recent Labs Lab 03/11/14 2218  TROPONINI <0.30   Iron Studies:  Recent Labs   03/13/14 0442   IRON  149*   TIBC  NOT CALC   FERRITIN  2786*       Imaging Studies:  No results found.  Assessment/Plan: 73 y.o. AAF with   1. Multiple Myeloma  S/p chemotherapy with Velcade, Revlimid, most recently on Carfilzomib. She also received intermittently Zometa. A metastatic bone survey demonstrates small lytic lesions within the skull and right scapula. She has relocated to [place] from [place]. No further records are available for review.   Calcium levels within normal limits   SPEP with IFE, LDH 222 and  serum light chains are as follows: A 1.25 lambda 1260, and Kappa-Lambda Ratio is 0 Bone Marrow Biopsy planned for 6/16. Will follow.   2. Anemia in the setting of Multiple Myeloma, Hemoccult positive and ESRD on HD  She has received total of 5  units of blood since admission  Records show that her baseline may be between 7.5-9. Today at 7.0 . Iron studies consistent with chronic disease. Iron is 149 ferritin 2786  Epoietin 200 mcg  received on 6/13   GI evaluation  On 6/12 concluded that anemia is likely multifactorial, and no plans for immediate GI studies are present. Aparently patient had a colonoscopy about 18 months ago while in [place], which results have been requested by GI.   3. Thrombocytopenia  Secondary to Multiple Myeloma  Monitor closely . Currently at 70,000.  Avoid Heparin products while on HD   4. Hyponatremia  Secondary to multiple myeloma, dilution  Primary team and renal team following.   5. Full Code  Other medical issues as per admitting and renal teams.   **Disclaimer: This note was dictated with voice recognition software. Similar sounding words can inadvertently be transcribed and this note may contain transcription errors which may not have been corrected upon publication of note.** WERTMAN,SARA E, PA-C 03/15/2014  12:46 PM  ADDENDUM:  I reviewed above. I am awaiting more records from her doctor up in [place]. What I saw really did not help me all that much. Am not sure exactly how much chemotherapy she has had.  She is to have a bone marrow biopsy today. This will help [place] out.  We sent off a myeloma studies.  He's getting dialysis. Again, I did a bone marrow will definitely be helpful in allowing [place] to decide when and with what to treat her with.  We will continue to follow her along.

## 2014-03-15 NOTE — Progress Notes (Signed)
 [facility] PROGRESS NOTE Interim History: 72 y.o. female with ESRD, dialized earlier today. Received phone call after dialysis stating that her HGB is low so she presented to ED. She is asymptomatic at this time, denies bleeding from anywhere subjectively (objectively she has BRBPR on rectal exam). No blood loss today at dialysis     Assessment/Plan: Severe anemia/generalized weakness: - S/p 3 unitsPRBC. Hbg 8.4 - FOBT positive. Rectal exam showed brown stool. She denies BRBPR. - The most likely cause of her pancytopenia is her MM which she is not on treatment.  - GI recommended no further studies. To consult hematology.   Multiple myeloma/Thrombocytopenia /Pancytopenia - cont to monitor. - albumin protein dissociation. - consult hematology, rec consult IR for Bone marrow Biopsy on 6.17.2015.  ESRD (end stage renal disease) - TTS, will consult renal for HD in on Saturday. - ESRD is mot likely due to MM, getting records from her oncologist.  HTN (hypertension) - cont home meds.    Code Status: Full  Family Communication: Family at bedside  Disposition Plan: Admit to inpatient     Consultants:  renal  Procedures:  FOBT:+  Antibiotics:  none  HPI/Subjective: Feels weak.  Objective: Filed Vitals:   03/14/14 2053 03/15/14 0544 03/15/14 1038 03/15/14 1048  BP: 164/81 165/83 149/83   Pulse: 68 66 85   Temp: 99.5 F (37.5 C) 98.6 F (37 C)  98.4 F (36.9 C)  TempSrc: Oral Oral  Oral  Resp: 18 18 20   Height:      Weight: 65.8 kg (145 lb 1 oz)     SpO2: 94% 93% 95%     Intake/Output Summary (Last 24 hours) at 03/15/14 1235 Last data filed at 03/15/14 1039  Gross per 24 hour  Intake    600 ml  Output      0 ml  Net    600 ml   Filed Weights   03/13/14 1947 03/13/14 2042 03/14/14 2053  Weight: 64 kg (141 lb 1.5 oz) 64.592 kg (142 lb 6.4 oz) 65.8 kg (145 lb 1 oz)    Exam:  General: Alert, awake, oriented x3, in no acute distress.  HEENT:  No bruits, no goiter. Still with +JVD Heart: Regular rate and rhythm, without murmurs, rubs, gallops.  Lungs: Good air movement, clear Abdomen: Soft, nontender, nondistended, positive bowel sounds.    Data Reviewed: Basic Metabolic Panel:  Recent Labs Lab 03/11/14 1658 03/12/14 0655 03/12/14 1800 03/13/14 0442  NA 129* 130*  --  125*  K 4.4 4.4  --  4.1  CL 96 96  --  92*  CO2 26 25  --  19  GLUCOSE 81 82  --  103*  BUN 25* 32*  --  38*  CREATININE 6.08* 7.72*  --  8.83*  CALCIUM 8.8 8.8  --  9.3  PHOS  --   --  6.5* 6.7*   Liver Function Tests:  Recent Labs Lab 03/11/14 1658 03/13/14 0442  AST 29  --   ALT 11  --   ALKPHOS 37*  --   BILITOT 0.4  --   PROT >15.0*  --   ALBUMIN 2.6* 2.6*   No results found for this basename: LIPASE, AMYLASE,  in the last 168 hours No results found for this basename: AMMONIA,  in the last 168 hours CBC:  Recent Labs Lab 03/11/14 1658 03/12/14 0655 03/13/14 0442 03/14/14 0916 03/15/14 0530  WBC 2.8* 4.2 5.6 3.6* 3.6*  NEUTROABS 1.6*  --   --   --   --     HGB 5.9* 6.4* 8.4* 8.6* 7.0*  HCT 19.2* 20.3* 26.9* 27.7* 22.7*  MCV 93.7 93.1 92.4 94.5 94.6  PLT 76* 80* 82* 81* 70*   Cardiac Enzymes:  Recent Labs Lab 03/11/14 2218  TROPONINI <0.30   BNP (last 3 results) No results found for this basename: PROBNP,  in the last 8760 hours CBG: No results found for this basename: GLUCAP,  in the last 168 hours  No results found for this or any previous visit (from the past 240 hour(s)).   Studies: No results found.  Scheduled Meds: . cyproheptadine  4 mg Oral BID  . darbepoetin (ARANESP) injection - DIALYSIS  200 mcg Intravenous Q Sat-HD  . doxercalciferol  2 mcg Intravenous Q T,Th,Sa-HD  . hydrALAZINE  100 mg Oral 3 times per day  . isosorbide mononitrate  60 mg Oral Daily  . metolazone  5 mg Oral BID  . multivitamin  1 tablet Oral QHS  . NIFEdipine  90 mg Oral Daily  . oxybutynin  5 mg Oral Daily  . sevelamer  carbonate  800 mg Oral TID WC  . sodium chloride  3 mL Intravenous Q12H  . valACYclovir  500 mg Oral Daily   Continuous Infusions:    Charlynne Cousins  [facility] Pager [phone]. If 8PM-8AM, please contact night-coverage at [email or web address], password [facility] 03/15/2014, 12:35 PM  LOS: 4 days    **Disclaimer: This note may have been dictated with voice recognition software. Similar sounding words can inadvertently be transcribed and this note may contain transcription errors which may not have been corrected upon publication of note.**

## 2014-03-15 NOTE — Progress Notes (Signed)
Will perform BM Bx in AM on 6/16 as [facility] pathology unable to collect specimen this morning.

## 2014-03-15 NOTE — Progress Notes (Signed)
Unassigned patient Subjective: Patient is very frustrated as she did not receive a bone marrow biopsy today. Also wonders why her hemoglobin "goes up and down". She claims you all have me on all these medications, I am seeing things around me that i am not supposed to see"?? Chart reviewed and patient discussed with Dr. Erskine Emery. There is no evidence of GI bleeding at this time causing her anemia. The mild hematochezia she had was thought to be from her  Hemorrhoids.  Objective: Vital signs in last 24 hours: Temp:  [98.1 F (36.7 C)-99.5 F (37.5 C)] 98.1 F (36.7 C) (06/15 1745) Pulse Rate:  [66-85] 69 (06/15 1745) Resp:  [18-20] 20 (06/15 1745) BP: (128-165)/(73-83) 128/73 mmHg (06/15 1745) SpO2:  [93 %-96 %] 96 % (06/15 1745) Weight:  [65.8 kg (145 lb 1 oz)] 65.8 kg (145 lb 1 oz) (06/14 2053) Last BM Date: 03/14/14  Intake/Output from previous day: 06/14 0701 - 06/15 0700 In: 720 [P.O.:720] Out: -  Intake/Output this shift:   General appearance: alert, cooperative, fatigued, no distress and pale Resp: clear to auscultation bilaterally Cardio: regular rate and rhythm, S1, S2 normal, no murmur, click, rub or gallop GI: soft, non-tender; bowel sounds normal; no masses,  no organomegaly Extremities: extremities normal, atraumatic, no cyanosis or edema  Lab Results:  Recent Labs  03/13/14 0442 03/14/14 0916 03/15/14 0530  WBC 5.6 3.6* 3.6*  HGB 8.4* 8.6* 7.0*  HCT 26.9* 27.7* 22.7*  PLT 82* 81* 70*   BMET  Recent Labs  03/13/14 0442  NA 125*  K 4.1  CL 92*  CO2 19  GLUCOSE 103*  BUN 38*  CREATININE 8.83*  CALCIUM 9.3   LFT  Recent Labs  03/13/14 0442  ALBUMIN 2.6*   PT/INR  Recent Labs  03/15/14 0530  LABPROT 16.7*  INR 1.39   Studies/Results: No results found.  Medications: I have reviewed the patient's current medications.  Assessment/Plan: Anemia associated with multiple myeloma-will sign off now.   LOS: 4 days    Averie Meiner 03/15/2014, 8:18 PM

## 2014-03-15 NOTE — Progress Notes (Signed)
Patient ID: [name], female   DOB: [birth year], 73 y.o.   MRN: [id number]   Cytology not able to accommodate BM bx today. Rescheduled to 6/16 am New npo orders in for tomorrow. Resume diet for today.  Pt still not sure she is "ready for procedure" I will speak to her as soon as I can.    Addendum: Came to room to inform pt we will be able to perform BM bx in CT tomorrow. She is refusing procedure for 6/16--"dialysis day" We will attempt to reschedule til 6/17 Pt feels this will be fine.   New orders for npo for 6/17 in computer

## 2014-03-15 NOTE — Progress Notes (Signed)
Subjective:  No current complaints, no dyspnea, NPO for bone marrow biopsy today.  Objective: Vital signs in last 24 hours: Temp:  [98.6 F (37 C)-99.5 F (37.5 C)] 98.6 F (37 C) (06/15 0544) Pulse Rate:  [64-68] 66 (06/15 0544) Resp:  [17-18] 18 (06/15 0544) BP: (144-178)/(81-92) 165/83 mmHg (06/15 0544) SpO2:  [93 %-95 %] 93 % (06/15 0544) Weight:  [65.8 kg (145 lb 1 oz)] 65.8 kg (145 lb 1 oz) (06/14 2053) Weight change: 1.3 kg (2 lb 13.9 oz)  Intake/Output from previous day: 06/14 0701 - 06/15 0700 In: 720 [P.O.:720] Out: -    Lab Results:  Recent Labs  03/14/14 0916 03/15/14 0530  WBC 3.6* 3.6*  HGB 8.6* 7.0*  HCT 27.7* 22.7*  PLT 81* 70*   BMET:  Recent Labs  03/13/14 0442  NA 125*  K 4.1  CL 92*  CO2 19  GLUCOSE 103*  BUN 38*  CREATININE 8.83*  CALCIUM 9.3  ALBUMIN 2.6*   No results found for this basename: PTH,  in the last 72 hours Iron Studies:  Recent Labs  03/13/14 0442  IRON 149*  TIBC NOT CALC  FERRITIN 2786*   Studies/Results: No results found.  EXAM: General appearance:  Alert, in no apparent distress Resp:  CTA without rales, rhonchi, or wheezes Cardio:  RRR without murmur or rub GI:  + BS, soft and nontender Extremities:  No edema  Access:  AVF @ LUA with + bruit  Dialysis Orders:  [facility]    3 hrs    400/A1.5    64.5 kg      3K/2.5Ca     AVF @ LUA  No Heparin       Aranesp 160 mcg on Tues     Hectorol 2 mcg       No Venofer  Assessment/Plan: 1. Severe anemia - Hgb up to 8.6 yesterday s/p 3 U PRBCs, today 7; most likely sec to MM, although Heme + stool, no further studies per GI, Aranesp 200 mcg 6/13. 2. Multiple myeloma - diagnosed 03/2008, s/p chemo in [place]; bone marrow biopsy pending today (per Oncology). 3. ESRD - HD on TTS @ [place], last K 4.1.  HD pending. 4. HTN/Volume - BP 165/83 on Hydralazine, Nifedipine; no signs or symptoms of fluid overload. 5. Sec HPT - Ca 8.8 (9.9 corrected), P 6.7; Hectorol 2 mcg, Renvela with  meals. 6. Nutrition - Alb 2.6, currently NPO, vitamin.   LOS: 4 days   [name],CHARLES 03/15/2014,8:06 AM  Pt seen, examined and agree w A/P as above.  For BM biopsy today. HD tomorrow. Kelly Splinter MD pager [phone]    cell [phone] 03/15/2014, 1:36 PM

## 2014-03-16 DIAGNOSIS — D696 Thrombocytopenia, unspecified: Secondary | ICD-10-CM

## 2014-03-16 DIAGNOSIS — E871 Hypo-osmolality and hyponatremia: Secondary | ICD-10-CM

## 2014-03-16 DIAGNOSIS — N186 End stage renal disease: Secondary | ICD-10-CM

## 2014-03-16 DIAGNOSIS — C9000 Multiple myeloma not having achieved remission: Principal | ICD-10-CM

## 2014-03-16 LAB — RENAL FUNCTION PANEL
ALBUMIN: 2.4 g/dL — AB (ref 3.5–5.2)
ALBUMIN: 2.3 g/dL — AB (ref 3.5–5.2)
BUN: 36 mg/dL — AB (ref 6–23)
BUN: 38 mg/dL — ABNORMAL HIGH (ref 6–23)
CHLORIDE: 92 meq/L — AB (ref 96–112)
CO2: 25 mEq/L (ref 19–32)
CO2: 24 mEq/L (ref 19–32)
Calcium: 7.6 mg/dL — ABNORMAL LOW (ref 8.4–10.5)
Calcium: 7.5 mg/dL — ABNORMAL LOW (ref 8.4–10.5)
Chloride: 93 mEq/L — ABNORMAL LOW (ref 96–112)
Creatinine, Ser: 9.16 mg/dL — ABNORMAL HIGH (ref 0.50–1.10)
Creatinine, Ser: 9.58 mg/dL — ABNORMAL HIGH (ref 0.50–1.10)
GFR calc non Af Amer: 4 mL/min — ABNORMAL LOW (ref >90)
GFR, EST AFRICAN AMERICAN: 4 mL/min — AB (ref >90)
GFR, EST AFRICAN AMERICAN: 4 mL/min — AB (ref >90)
GFR, EST NON AFRICAN AMERICAN: 4 mL/min — AB (ref >90)
Glucose, Bld: 76 mg/dL (ref 70–99)
Glucose, Bld: 82 mg/dL (ref 70–99)
POTASSIUM: 3.8 meq/L (ref 3.7–5.3)
POTASSIUM: 3.8 meq/L (ref 3.7–5.3)
Phosphorus: 5.4 mg/dL — ABNORMAL HIGH (ref 2.3–4.6)
Phosphorus: 5.8 mg/dL — ABNORMAL HIGH (ref 2.3–4.6)
Sodium: 127 mEq/L — ABNORMAL LOW (ref 137–147)
Sodium: 126 mEq/L — ABNORMAL LOW (ref 137–147)

## 2014-03-16 LAB — IGG, IGA, IGM
IGG (IMMUNOGLOBIN G), SERUM: 11300 mg/dL — AB (ref 690–1700)
IgA: <6 mg/dL — ABNORMAL LOW (ref 69–380)
IgM, Serum: <5 mg/dL — ABNORMAL LOW (ref 52–322)

## 2014-03-16 LAB — IMMUNOFIXATION ADD-ON

## 2014-03-16 LAB — CBC
HCT: 24.8 % — ABNORMAL LOW (ref 36.0–46.0)
HCT: 23.4 % — ABNORMAL LOW (ref 36.0–46.0)
HEMOGLOBIN: 7.6 g/dL — AB (ref 12.0–15.0)
Hemoglobin: 7.9 g/dL — ABNORMAL LOW (ref 12.0–15.0)
MCH: 29.5 pg (ref 26.0–34.0)
MCH: 29.7 pg (ref 26.0–34.0)
MCHC: 31.9 g/dL (ref 30.0–36.0)
MCHC: 32.5 g/dL (ref 30.0–36.0)
MCV: 92.5 fL (ref 78.0–100.0)
MCV: 91.4 fL (ref 78.0–100.0)
PLATELETS: 76 10*3/uL — AB (ref 150–400)
Platelets: 78 10*3/uL — ABNORMAL LOW (ref 150–400)
RBC: 2.68 MIL/uL — ABNORMAL LOW (ref 3.87–5.11)
RBC: 2.56 MIL/uL — ABNORMAL LOW (ref 3.87–5.11)
RDW: 18.4 % — AB (ref 11.5–15.5)
RDW: 18.3 % — AB (ref 11.5–15.5)
WBC: 4.4 10*3/uL (ref 4.0–10.5)
WBC: 4.5 10*3/uL (ref 4.0–10.5)

## 2014-03-16 LAB — PROTEIN ELECTROPH W RFLX QUANT IMMUNOGLOBULINS
ALPHA-1-GLOBULIN: 2.9 % (ref 2.9–4.9)
Albumin ELP: 29.7 % — ABNORMAL LOW (ref 55.8–66.1)
Alpha-2-Globulin: 4.8 % — ABNORMAL LOW (ref 7.1–11.8)
Beta 2: 2 % — ABNORMAL LOW (ref 3.2–6.5)
Beta Globulin: 2.3 % — ABNORMAL LOW (ref 4.7–7.2)
GAMMA GLOBULIN: 58.3 % — AB (ref 11.1–18.8)
M-Spike, %: 7.52 g/dL
Total Protein ELP: 13.9 g/dL — ABNORMAL HIGH (ref 6.0–8.3)

## 2014-03-16 MED ORDER — ALTEPLASE 2 MG IJ SOLR
2.0000 mg | Freq: Once | INTRAMUSCULAR | Status: DC | PRN
  Filled 2014-03-16: qty 2

## 2014-03-16 MED ORDER — NEPRO/CARBSTEADY PO LIQD
237.0000 mL | ORAL | Status: DC | PRN
  Filled 2014-03-16: qty 237

## 2014-03-16 MED ORDER — LIDOCAINE-PRILOCAINE 2.5-2.5 % EX CREA
1.0000 "application " | TOPICAL_CREAM | CUTANEOUS | Status: DC | PRN
  Filled 2014-03-16: qty 5

## 2014-03-16 MED ORDER — HEPARIN SODIUM (PORCINE) 1000 UNIT/ML DIALYSIS: 1000.0000 [IU] | INTRAMUSCULAR | Status: DC | PRN

## 2014-03-16 MED ORDER — LIDOCAINE HCL (PF) 1 % IJ SOLN
5.0000 mL | INTRAMUSCULAR | Status: DC | PRN
Start: 2014-03-16 — End: 2014-03-16

## 2014-03-16 MED ORDER — SODIUM CHLORIDE 0.9 % IV SOLN: 100.0000 mL | INTRAVENOUS | Status: DC | PRN

## 2014-03-16 MED ORDER — DOXERCALCIFEROL 4 MCG/2ML IV SOLN
INTRAVENOUS | Status: AC
  Filled 2014-03-16: qty 2

## 2014-03-16 MED ORDER — PENTAFLUOROPROP-TETRAFLUOROETH EX AERO: 1.0000 "application " | INHALATION_SPRAY | CUTANEOUS | Status: DC | PRN

## 2014-03-16 NOTE — Progress Notes (Signed)
[facility] PROGRESS NOTE Interim History: 73 y.o. female with ESRD, dialized earlier today. Received phone call after dialysis stating that her HGB is low so she presented to ED. She is asymptomatic at this time, denies bleeding from anywhere subjectively (objectively she has BRBPR on rectal exam). No blood loss today at dialysis   Assessment/Plan: Severe anemia/generalized weakness: - S/p 3 unitsPRBC. Hbg 8.4 - FOBT positive. Rectal exam showed brown stool. She denies BRBPR. - The most likely cause of her pancytopenia is her MM which she is not on treatment.  - GI recommended no further studies.  - SPEP with IFE, LDH 222 and serum light chains are as follows: A 1.25 lambda 1260, and Kappa-Lambda Ratio is 0.    Multiple myeloma/Thrombocytopenia /Pancytopenia - cont to monitor. - albumin protein dissociation. - consult hematology, rec consult IR for Bone marrow Biopsy on 6.16.2015.  ESRD (end stage renal disease) - TTS, will consult renal for HD in on Saturday. - ESRD is mot likely due to MM, getting records from her oncologist.  HTN (hypertension) - cont home meds.    Code Status: Full  Family Communication: Family at bedside  Disposition Plan: Admit to inpatient     Consultants:  renal  Procedures:  FOBT:+  Antibiotics:  none  HPI/Subjective: Feels weak.  Objective: Filed Vitals:   03/16/14 0034 03/16/14 0045 03/16/14 0527 03/16/14 1228  BP: 133/77 147/97 137/78 141/74  Pulse: 82 75 67 69  Temp: 98 F (36.7 C) 98.4 F (36.9 C) 98.1 F (36.7 C) 98.5 F (36.9 C)  TempSrc: Oral Oral Oral Oral  Resp: _0 Height:      Weight:      SpO2:   95%     Intake/Output Summary (Last 24 hours) at 03/16/14 1250 Last data filed at 03/16/14 0532  Gross per 24 hour  Intake    565 ml  Output    100 ml  Net    465 ml   Filed Weights   03/14/14 2053 03/15/14 2052 03/15/14 2234  Weight: 65.8 kg (145 lb 1 oz) 65.8 kg (145 lb 1 oz) 65.8 kg (145  lb 1 oz)    Exam:  General: Alert, awake, oriented x3, in no acute distress.  HEENT: No bruits, no goiter. Still with +JVD Heart: Regular rate and rhythm, without murmurs, rubs, gallops.  Lungs: Good air movement, clear Abdomen: Soft, nontender, nondistended, positive bowel sounds.    Data Reviewed: Basic Metabolic Panel:  Recent Labs Lab 03/11/14 1658 03/12/14 0655 03/12/14 1800 03/13/14 0442 03/16/14 0800  NA 129* 130*  --  125* 127*  K 4.4 4.4  --  4.1 3.8  CL 96 96  --  92* 92*  CO2 26 25  --  19 25  GLUCOSE 81 82  --  103* 76  BUN 25* 32*  --  38* 36*  CREATININE 6.08* 7.72*  --  8.83* 9.16*  CALCIUM 8.8 8.8  --  9.3 7.6*  PHOS  --   --  6.5* 6.7* 5.4*   Liver Function Tests:  Recent Labs Lab 03/11/14 1658 03/13/14 0442 03/16/14 0800  AST 29  --   --   ALT 11  --   --   ALKPHOS 37*  --   --   BILITOT 0.4  --   --   PROT >15.0*  --   --   ALBUMIN 2.6* 2.6* 2.4*   No results found for this basename: LIPASE, AMYLASE,  in the last 168 hours No results found for this basename: AMMONIA,  in the last 168 hours CBC:  Recent Labs Lab 03/11/14 1658 03/12/14 0655 03/13/14 0442 03/14/14 0916 03/15/14 0530 03/16/14 0750  WBC 2.8* 4.2 5.6 3.6* 3.6* 4.4  NEUTROABS 1.6*  --   --   --   --   --   HGB 5.9* 6.4* 8.4* 8.6* 7.0* 7.9*  HCT 19.2* 20.3* 26.9* 27.7* 22.7* 24.8*  MCV 93.7 93.1 92.4 94.5 94.6 92.5  PLT 76* 80* 82* 81* 70* 76*   Cardiac Enzymes:  Recent Labs Lab 03/11/14 2218  TROPONINI <0.30   BNP (last 3 results) No results found for this basename: PROBNP,  in the last 8760 hours CBG: No results found for this basename: GLUCAP,  in the last 168 hours  No results found for this or any previous visit (from the past 240 hour(s)).   Studies: No results found.  Scheduled Meds: . cyproheptadine  4 mg Oral BID  . darbepoetin (ARANESP) injection - DIALYSIS  200 mcg Intravenous Q Sat-HD  . doxercalciferol  2 mcg Intravenous Q T,Th,Sa-HD  .  hydrALAZINE  100 mg Oral 3 times per day  . isosorbide mononitrate  60 mg Oral Daily  . metolazone  5 mg Oral BID  . multivitamin  1 tablet Oral QHS  . NIFEdipine  90 mg Oral Daily  . oxybutynin  5 mg Oral Daily  . sevelamer carbonate  800 mg Oral TID WC  . sodium chloride  3 mL Intravenous Q12H  . valACYclovir  500 mg Oral Daily   Continuous Infusions:    Charlynne Cousins  [facility] Pager [phone]. If 8PM-8AM, please contact night-coverage at [email or web address], password [facility] 03/16/2014, 12:50 PM  LOS: 5 days    **Disclaimer: This note may have been dictated with voice recognition software. Similar sounding words can inadvertently be transcribed and this note may contain transcription errors which may not have been corrected upon publication of note.**

## 2014-03-16 NOTE — Progress Notes (Addendum)
Subjective:  Bone marrow biopsy postponed until today, no current complaints   Objective: Vital signs in last 24 hours: Temp:  [97.9 F (36.6 C)-98.6 F (37 C)] 98.1 F (36.7 C) (06/16 0527) Pulse Rate:  [66-85] 67 (06/16 0527) Resp:  [18-20] 18 (06/16 0527) BP: (126-149)/(73-83) 137/78 mmHg (06/16 0527) SpO2:  [95 %-99 %] 95 % (06/16 0527) Weight:  [65.8 kg (145 lb 1 oz)] 65.8 kg (145 lb 1 oz) (06/15 2234) Weight change: 0 kg (0 lb)  Intake/Output from previous day: 06/15 0701 - 06/16 0700 In: 360 [P.O.:360] Out: 100 [Emesis/NG output:100] Intake/Output this shift:   Lab Results:  Recent Labs  03/14/14 0916 03/15/14 0530  WBC 3.6* 3.6*  HGB 8.6* 7.0*  HCT 27.7* 22.7*  PLT 81* 70*   BMET: No results found for this basename: NA, K, CL, CO2, GLUCOSE, BUN, CREATININE, CALCIUM, PHOSPHORUS, ALBUMIN,  in the last 72 hours No results found for this basename: PTH,  in the last 72 hours Iron Studies: No results found for this basename: IRON, TIBC, TRANSFERRIN, FERRITIN,  in the last 72 hours  Studies/Results: No results found.  EXAM:  General appearance: Alert, in no apparent distress  Resp: CTA without rales, rhonchi, or wheezes  Cardio: RRR without murmur or rub  GI: + BS, soft and nontender  Extremities: No edema  Access: AVF @ LUA with + bruit   Dialysis Orders: TTS @ [facility] 3 hrs 400/A1.5 64.5 kg 3K/2.5Ca AVF @ LUA  No Heparin Aranesp 160 mcg on Tues Hectorol 2 mcg No Venofer  Assessment/Plan: 1. Severe anemia - Hgb up to 8.6 s/p 3 U PRBCs 6/13, yesterday 7; most likely sec to MM, although Heme + stool, no further studies per GI, Aranesp 200 mcg 6/13.  CBC pending. 2. Multiple myeloma - diagnosed 03/2008, s/p chemo in [place]; bone marrow biopsy pending today (per Oncology).  3. ESRD - HD on TTS @ [place], last K 4.1. HD pending.  4. HTN/Volume - BP 137/78 on Hydralazine, Nifedipine; no signs or symptoms of fluid overload.  5. Sec HPT - Ca 8.8 (9.9 corrected), P 6.7; Hectorol  2 mcg, Renvela with meals.  6. Nutrition - Alb 2.6, currently NPO, vitamin.     LOS: 5 days   CarrieCarrie [name] 03/16/2014,7:47 AM   Pt seen, examined and agree w A/P as above.  Hgb continues to decline slowly , s/p prbc x 3 since admission.  GI signed off, no active bleeding.  Suspected anemia due to myeloma.  Kelly Splinter MD pager [phone]    cell [phone] 03/16/2014, 2:43 PM

## 2014-03-17 ENCOUNTER — Inpatient Hospital Stay (HOSPITAL_COMMUNITY): Payer: Medicare Other

## 2014-03-17 ENCOUNTER — Encounter (HOSPITAL_COMMUNITY): Payer: Self-pay | Admitting: *Deleted

## 2014-03-17 DIAGNOSIS — D62 Acute posthemorrhagic anemia: Secondary | ICD-10-CM | POA: Diagnosis not present

## 2014-03-17 DIAGNOSIS — C9000 Multiple myeloma not having achieved remission: Secondary | ICD-10-CM | POA: Diagnosis not present

## 2014-03-17 DIAGNOSIS — D61818 Other pancytopenia: Secondary | ICD-10-CM

## 2014-03-17 DIAGNOSIS — I10 Essential (primary) hypertension: Secondary | ICD-10-CM

## 2014-03-17 LAB — BONE MARROW EXAM

## 2014-03-17 LAB — PREPARE RBC (CROSSMATCH)

## 2014-03-17 MED ORDER — FENTANYL CITRATE 0.05 MG/ML IJ SOLN
INTRAMUSCULAR | Status: AC
  Filled 2014-03-17: qty 4

## 2014-03-17 MED ORDER — LIDOCAINE-PRILOCAINE 2.5-2.5 % EX CREA: 1.0000 "application " | TOPICAL_CREAM | CUTANEOUS | Status: DC | PRN

## 2014-03-17 MED ORDER — HEPARIN SODIUM (PORCINE) 1000 UNIT/ML DIALYSIS
1000.0000 [IU] | INTRAMUSCULAR | Status: DC | PRN
  Filled 2014-03-17: qty 1

## 2014-03-17 MED ORDER — NEPRO/CARBSTEADY PO LIQD: 237.0000 mL | ORAL | Status: DC | PRN

## 2014-03-17 MED ORDER — SODIUM CHLORIDE 0.9 % IV SOLN: 100.0000 mL | INTRAVENOUS | Status: DC | PRN

## 2014-03-17 MED ORDER — MIDAZOLAM HCL 2 MG/2ML IJ SOLN
INTRAMUSCULAR | Status: AC | PRN
  Administered 2014-03-17 (×2): 1 mg via INTRAVENOUS

## 2014-03-17 MED ORDER — MIDAZOLAM HCL 2 MG/2ML IJ SOLN
INTRAMUSCULAR | Status: AC
  Filled 2014-03-17: qty 4

## 2014-03-17 MED ORDER — FENTANYL CITRATE 0.05 MG/ML IJ SOLN
INTRAMUSCULAR | Status: AC | PRN
  Administered 2014-03-17 (×2): 25 ug via INTRAVENOUS

## 2014-03-17 MED ORDER — LIDOCAINE HCL (PF) 1 % IJ SOLN: 5.0000 mL | INTRAMUSCULAR | Status: DC | PRN

## 2014-03-17 MED ORDER — LIDOCAINE HCL 1 % IJ SOLN
INTRAMUSCULAR | Status: AC
  Filled 2014-03-17: qty 10

## 2014-03-17 MED ORDER — ALTEPLASE 2 MG IJ SOLR
2.0000 mg | Freq: Once | INTRAMUSCULAR | Status: AC | PRN
  Filled 2014-03-17: qty 2

## 2014-03-17 MED ORDER — PENTAFLUOROPROP-TETRAFLUOROETH EX AERO: 1.0000 "application " | INHALATION_SPRAY | CUTANEOUS | Status: DC | PRN

## 2014-03-17 NOTE — Procedures (Signed)
CT guided bone marrow biopsy.  No immediate complication.   

## 2014-03-17 NOTE — Discharge Summary (Addendum)
Physician Discharge Summary  [name] WUJ:[id number] DOB: 1940-11-11 DOA: 03/11/2014  PCP: No primary [name] on file.  Admit date: 03/11/2014 Discharge date: 03/18/2014  Time spent: 45 minutes  Recommendations for Outpatient Follow-up:  1. PCP in 1 week 2. Dr.Ennever in 2 weeks, FU Bone marrow biopsy results 3. CBC in 1 week  Discharge Diagnoses:    Anemia   Pancytopenia   ESRD (end stage renal disease)   Multiple myeloma   HTN (hypertension   Cognitive dysfunction   Discharge Condition: stable  Diet recommendation: Renal  Filed Weights   03/16/14 1325 03/16/14 1635 03/17/14 0412  Weight: 67.3 kg (148 lb 5.9 oz) 65.6 kg (144 lb 10 oz) 68 kg (149 lb 14.6 oz)    History of present illness:  [name] is a 73 y.o. female with ESRD, dialized earlier today. Received phone call after dialysis stating that her HGB is low so she presented to ED. She is asymptomatic at this time, denies bleeding from anywhere subjectively (objectively she has BRBPR on rectal exam). No blood loss today at dialysis (did lose a lot on 02/27/14 she states). No hematemesis, no black tarry stool (again, subjective only).  She has a h/o multiple myeloma, and has had GIB in the past worked up in [place].  Hospital Course:  Severe anemia/generalized weakness:  - S/p 3 units PRBC, hb 7.6 at discharge  - FOBT positive. Rectal exam showed brown stool. She denies BRBPR.  - The most likely cause of her pancytopenia is her MM which she is not on treatment.  - Was seen by GI recommended no further studies.  - SPEP with IFE, LDH 222 and serum light chains are as follows: A 1.25 lambda 1260, and Kappa-Lambda Ratio is 0.  -To FU with Dr.Ennever as outpatient  Multiple myeloma/Thrombocytopenia /Pancytopenia  -  followed by Heme in [place], received chemo, revlimid - SPEP with IFE, LDH 222 and serum light chains are as follows: A 1.25 lambda 1260, and Kappa-Lambda Ratio is 0.  - seen by Dr.Ennever, s/p Bone  marrow biopsy today -to FU with Dr.Ennever   ESRD (end stage renal disease)  -stable, HD per Renal  HTN (hypertension)  - cont home meds.  Cognitive dysfunction vs Early dementia -stable  Procedures:  CT guided BM biopsy  Consultations:  Ennever  Renal  Discharge Exam: Filed Vitals:   03/17/14 1202  BP: 148/91  Pulse: 63  Temp:   Resp: 14    General: AAOx3 Cardiovascular: S1S2/RRR Respiratory: CTAB  Discharge Instructions You were cared for by a hospitalist during your hospital stay. If you have any questions about your discharge medications or the care you received while you were in the hospital after you are discharged, you can call the unit and asked to speak with the hospitalist on call if the hospitalist that took care of you is not available. Once you are discharged, your primary care physician will handle any further medical issues. Please note that NO REFILLS for any discharge medications will be authorized once you are discharged, as it is imperative that you return to your primary care physician (or establish a relationship with a primary care physician if you do not have one) for your aftercare needs so that they can reassess your need for medications and monitor your lab values.  Discharge Instructions   Discharge instructions    Complete by:  As directed   Renal Diet     Increase activity slowly    Complete by:  As directed             Medication List         acetaminophen-codeine 300-30 MG per tablet  Commonly known as:  TYLENOL #3  Take by mouth every 4 (four) hours as needed for moderate pain.     cyproheptadine 4 MG tablet  Commonly known as:  PERIACTIN  Take 4 mg by mouth 2 (two) times daily.     DAILY-VITE/IRON PO  Take 1 tablet by mouth daily.     dextromethorphan-guaiFENesin 10-100 MG/5ML liquid  Commonly known as:  ROBITUSSIN-DM  Take 10 mLs by mouth 3 (three) times daily as needed for cough.     hydrALAZINE 100 MG tablet   Commonly known as:  APRESOLINE  Take 100 mg by mouth every 8 (eight) hours.     isosorbide mononitrate 60 MG 24 hr tablet  Commonly known as:  IMDUR  Take 60 mg by mouth daily.     metolazone 5 MG tablet  Commonly known as:  ZAROXOLYN  Take 5 mg by mouth 2 (two) times daily.     NIFEdipine 90 MG 24 hr tablet  Commonly known as:  PROCARDIA XL/ADALAT-CC  Take 90 mg by mouth daily.     oxybutynin 5 MG tablet  Commonly known as:  DITROPAN  Take 5 mg by mouth daily.     promethazine-codeine 6.25-10 MG/5ML syrup  Commonly known as:  PHENERGAN with CODEINE  Take 5 mLs by mouth every 6 (six) hours as needed for cough.     RENVELA 800 MG tablet  Generic drug:  sevelamer carbonate  Take 800 mg by mouth 3 (three) times daily with meals.     valACYclovir 500 MG tablet  Commonly known as:  VALTREX  Take 500 mg by mouth 2 (two) times daily.       No Known Allergies    The results of significant diagnostics from this hospitalization (including imaging, microbiology, ancillary and laboratory) are listed below for reference.    Significant Diagnostic Studies: Ct Biopsy  03/17/2014   CLINICAL DATA:  73 year old with history of myeloma and anemia.  EXAM: CT GUIDED BONE MARROW ASPIRATES AND BIOPSY  Physician: Stephan Minister. Henn, MD  MEDICATIONS: 2 mg Versed, 50 mcg fentanyl. A radiology nurse monitored the patient for moderate sedation.  ANESTHESIA/SEDATION: Sedation time: 10 min  PROCEDURE: The procedure was explained to the patient. The risks and benefits of the procedure were discussed and the patient's questions were addressed. Informed consent was obtained from the patient. The patient was placed prone on CT scan. Images of the pelvis were obtained. The right side of back was prepped and draped in sterile fashion. The skin and right posterior iliac bone were anesthetized with 1% lidocaine. 11 gauge bone needle was directed into the right iliac bone with CT guidance. Two aspirates and one core  biopsy obtained.  FINDINGS: Needle directed into the right iliac bone. Subtle areas of lucency throughout the pelvic bones. Findings are consistent with history of myeloma.  COMPLICATIONS: None  IMPRESSION: CT guided bone marrow aspirates and core biopsy.   Electronically Signed   By: Markus Daft M.D.   On: 03/17/2014 13:07   Dg Bone Survey Met  03/12/2014   CLINICAL DATA:  Myeloma.  Right knee pain.  EXAM: METASTATIC BONE SURVEY  COMPARISON:  None.  FINDINGS: Multiple images are performed of the axial and appendicular skeleton.  Multiple small lytic lesions are identified within the skull consistent with myeloma. A  small lytic lesion is identified within the right scapula.  Degenerative changes are identified within the mid cervical spine. No suspicious lytic or blastic lesions are identified within the spine, or long bones. Surgical clips are identified in the left upper arm. Degenerative changes are seen in the knees bilaterally, right greater than left.  The heart is mildly enlarged. There are areas of atelectasis within the right lung. No focal consolidations or pulmonary edema.  IMPRESSION: 1. Small lytic lesions within the skull and right scapula. 2. Degenerative changes in the spine and knees, right greater than left. 3. Cardiomegaly without pulmonary edema.   Electronically Signed   By: Shon Hale M.D.   On: 03/12/2014 10:13    Microbiology: No results found for this or any previous visit (from the past 240 hour(s)).   Labs: Basic Metabolic Panel:  Recent Labs Lab 03/11/14 1658 03/12/14 0655 03/12/14 1800 03/13/14 0442 03/16/14 0800 03/16/14 1353  NA 129* 130*  --  125* 127* 126*  K 4.4 4.4  --  4.1 3.8 3.8  CL 96 96  --  92* 92* 93*  CO2 26 25  --  _0 GLUCOSE 81 82  --  103* 76 82  BUN 25* 32*  --  38* 36* 38*  CREATININE 6.08* 7.72*  --  8.83* 9.16* 9.58*  CALCIUM 8.8 8.8  --  9.3 7.6* 7.5*  PHOS  --   --  6.5* 6.7* 5.4* 5.8*   Liver Function Tests:  Recent Labs Lab  03/11/14 1658 03/13/14 0442 03/16/14 0800 03/16/14 1353  AST 29  --   --   --   ALT 11  --   --   --   ALKPHOS 37*  --   --   --   BILITOT 0.4  --   --   --   PROT >15.0*  --   --   --   ALBUMIN 2.6* 2.6* 2.4* 2.3*   No results found for this basename: LIPASE, AMYLASE,  in the last 168 hours No results found for this basename: AMMONIA,  in the last 168 hours CBC:  Recent Labs Lab 03/11/14 1658  03/13/14 0442 03/14/14 0916 03/15/14 0530 03/16/14 0750 03/16/14 1353  WBC 2.8*  < > 5.6 3.6* 3.6* 4.4 4.5  NEUTROABS 1.6*  --   --   --   --   --   --   HGB 5.9*  < > 8.4* 8.6* 7.0* 7.9* 7.6*  HCT 19.2*  < > 26.9* 27.7* 22.7* 24.8* 23.4*  MCV 93.7  < > 92.4 94.5 94.6 92.5 91.4  PLT 76*  < > 82* 81* 70* 76* 78*  < > = values in this interval not displayed. Cardiac Enzymes:  Recent Labs Lab 03/11/14 2218  TROPONINI <0.30   BNP: BNP (last 3 results) No results found for this basename: PROBNP,  in the last 8760 hours CBG: No results found for this basename: GLUCAP,  in the last 168 hours     Signed:  JOSEPH,PREETHA  [facility] 03/17/2014, 2:39 PM

## 2014-03-17 NOTE — Progress Notes (Signed)
Subjective:  NPO for bone marrow biopsy today, no current complaints  Objective: Vital signs in last 24 hours: Temp:  [97.4 F (36.3 C)-99.1 F (37.3 C)] 98.7 F (37.1 C) (06/17 0854) Pulse Rate:  [52-76] 52 (06/17 0854) Resp:  [16-18] 16 (06/17 0854) BP: (124-178)/(67-99) 131/74 mmHg (06/17 0854) SpO2:  [94 %-98 %] 95 % (06/17 0854) Weight:  [65.6 kg (144 lb 10 oz)-68 kg (149 lb 14.6 oz)] 68 kg (149 lb 14.6 oz) (06/17 0412) Weight change: 1.5 kg (3 lb 4.9 oz)  Intake/Output from previous day: 06/16 0701 - 06/17 0700 In: 360 [P.O.:360] Out: 2001 [Stool:1]   Lab Results:  Recent Labs  03/16/14 0750 03/16/14 1353  WBC 4.4 4.5  HGB 7.9* 7.6*  HCT 24.8* 23.4*  PLT 76* 78*   BMET:  Recent Labs  03/16/14 0800 03/16/14 1353  NA 127* 126*  K 3.8 3.8  CL 92* 93*  CO2 25 24  GLUCOSE 76 82  BUN 36* 38*  CREATININE 9.16* 9.58*  CALCIUM 7.6* 7.5*  ALBUMIN 2.4* 2.3*   No results found for this basename: PTH,  in the last 72 hours Iron Studies: No results found for this basename: IRON, TIBC, TRANSFERRIN, FERRITIN,  in the last 72 hours  Studies/Results: No results found.  EXAM:  General appearance: Alert, in no apparent distress  Resp: CTA without rales, rhonchi, or wheezes  Cardio: RRR without murmur or rub  GI: + BS, soft and nontender  Extremities: No edema  Access: AVF @ LUA with + bruit   Dialysis Orders: [facility]  3 hrs 400/A1.5 64.5 kg 3K/2.5Ca AVF @ LUA Heparin none Aranesp 160 mcg on Tues Hectorol 2 mcg No Venofer  Assessment/Plan: 1. Severe anemia - Hgb down to 7.6, s/p 3 U PRBCs 6/13; most likely sec to MM, although Heme + stool, no further studies per GI, Aranesp 200 mcg 6/13. 2. IgG lambda myeloma - diagnosed 03/2008, s/p chemo in [place]; bone marrow biopsy pending today (per Dr. Marin Olp).  3. ESRD - HD on TTS @ [place], K 3.8.  Next HD tomorrow.  4. HTN/Volume - BP 131/74 on Hydralazine, Nifedipine; wt 68 kg s/p net UF 2 L yesterday.  5. Sec HPT - Ca 8.8  (9.9 corrected), P 6.7; Hectorol 2 mcg, Renvela with meals.  6. Nutrition - Alb 2.6, currently NPO, vitamin.     LOS: 6 days   [name],CHARLES 03/17/2014,9:01 AM  Pt seen, examined and agree w A/P as above. Dr Marin Olp recommended further transfusion to get Hb up prior to d/c. I have ordered 2 more units prbc's to be given with HD tomorrow am.   Kelly Splinter MD pager [phone]    cell [phone] 03/17/2014, 11:34 AM

## 2014-03-17 NOTE — Progress Notes (Signed)
[name] has IgG lambda myeloma. The studies have come back showing that she has a gamma spike of 7.52 g/L. She is 11,000 mg/dL of IgG protein. She has about 1300 mg/dL of lambda light chain.  She is going for a bone marrow biopsy today. This will be incredibly important to see what the cytogenetics are for her myeloma.  Am still awaiting some records from her doctor up in [place].  She had a bone survey. No pathologic fractures or impending fractures are noted.  She is doing well with dialysis.  I'm sure the anemia is from the myeloma. I would have to suspect that the bone marrow show was quite an extensive involvement by myeloma.  Is going to be very difficult I think getting the right medications for her for the myeloma. We will probably use a combination of oral therapy and intravenous therapy.  Again, presentation might be a real problem. I will have to see how we can work around this.  If she is ready to go home from the standpoint of nephrology, I am okay with this.  I think that she might need to be transfused again. It would be nice to make sure that her blood count is a little bit better before she does go home.  This is truly a very interesting situation.   Pete E.  Isaiah 41:10

## 2014-03-17 NOTE — Progress Notes (Signed)
IV out of date. Spoke to patient about changing IV. Pt became very upset and refused. Explained the risks of having an out dated IV access. Per pt, "I'm going home after biopsy. I don't need it." Will continue to monitor. Velora Mediate

## 2014-03-18 DIAGNOSIS — D649 Anemia, unspecified: Secondary | ICD-10-CM

## 2014-03-18 LAB — CBC
HEMATOCRIT: 24.3 % — AB (ref 36.0–46.0)
HEMOGLOBIN: 7.9 g/dL — AB (ref 12.0–15.0)
MCH: 30 pg (ref 26.0–34.0)
MCHC: 32.5 g/dL (ref 30.0–36.0)
MCV: 92.4 fL (ref 78.0–100.0)
Platelets: 74 10*3/uL — ABNORMAL LOW (ref 150–400)
RBC: 2.63 MIL/uL — AB (ref 3.87–5.11)
RDW: 17.7 % — ABNORMAL HIGH (ref 11.5–15.5)
WBC: 5 10*3/uL (ref 4.0–10.5)

## 2014-03-18 LAB — RENAL FUNCTION PANEL
Albumin: 2.4 g/dL — ABNORMAL LOW (ref 3.5–5.2)
BUN: 31 mg/dL — AB (ref 6–23)
CO2: 25 meq/L (ref 19–32)
CREATININE: 8.22 mg/dL — AB (ref 0.50–1.10)
Calcium: 8.6 mg/dL (ref 8.4–10.5)
Chloride: 92 mEq/L — ABNORMAL LOW (ref 96–112)
GFR calc Af Amer: 5 mL/min — ABNORMAL LOW (ref >90)
GFR calc non Af Amer: 4 mL/min — ABNORMAL LOW (ref >90)
GLUCOSE: 79 mg/dL (ref 70–99)
Phosphorus: 5.8 mg/dL — ABNORMAL HIGH (ref 2.3–4.6)
Potassium: 3.8 mEq/L (ref 3.7–5.3)
Sodium: 125 mEq/L — ABNORMAL LOW (ref 137–147)

## 2014-03-18 MED ORDER — DOXERCALCIFEROL 4 MCG/2ML IV SOLN
INTRAVENOUS | Status: AC
  Administered 2014-03-18: 2 ug via INTRAVENOUS
  Filled 2014-03-18: qty 2

## 2014-03-18 NOTE — Progress Notes (Signed)
Patient Discharge:  Patient discharged home. Education:  Patient was educated about the diet and salt limitations.   IV: IV was removed before discharge Telemetry: Not on telemetry Follow-up appointments:  Reviewed follow-up appointments with the patient. Prescriptions: Reviewed medications with the patient. Transportation: Patient was transported by staff member in w/c to discharge lounge. Belongings: Patient took all her belongings with her.

## 2014-03-18 NOTE — Progress Notes (Signed)
Subjective:  Seen on dialysis, no complaints, but refuses transfusion today.   Objective: Vital signs in last 24 hours: Temp:  [97.8 F (36.6 C)-99.7 F (37.6 C)] 97.8 F (36.6 C) (06/18 0726) Pulse Rate:  [52-74] 65 (06/18 0814) Resp:  [14-18] 16 (06/18 0814) BP: (116-158)/(73-91) 122/82 mmHg (06/18 0814) SpO2:  [94 %-100 %] 94 % (06/18 0726) Weight:  [64.6 kg (142 lb 6.7 oz)-67.8 kg (149 lb 7.6 oz)] 64.6 kg (142 lb 6.7 oz) (06/18 0726) Weight change: 0.5 kg (1 lb 1.6 oz)  Intake/Output from previous day: 06/17 0701 - 06/18 0700 In: 240 [P.O.:240] Out: -    Lab Results:  Recent Labs  03/16/14 1353 03/18/14 0500  WBC 4.5 5.0  HGB 7.6* 7.9*  HCT 23.4* 24.3*  PLT 78* 74*   BMET:  Recent Labs  03/16/14 1353 03/18/14 0500  NA 126* 125*  K 3.8 3.8  CL 93* 92*  CO2 24 25  GLUCOSE 82 79  BUN 38* 31*  CREATININE 9.58* 8.22*  CALCIUM 7.5* 8.6  ALBUMIN 2.3* 2.4*   No results found for this basename: PTH,  in the last 72 hours Iron Studies: No results found for this basename: IRON, TIBC, TRANSFERRIN, FERRITIN,  in the last 72 hours  Studies/Results: Ct Biopsy  03/17/2014   CLINICAL DATA:  73 year old with history of myeloma and anemia.  EXAM: CT GUIDED BONE MARROW ASPIRATES AND BIOPSY  Physician: Stephan Minister. Henn, MD  MEDICATIONS: 2 mg Versed, 50 mcg fentanyl. A radiology nurse monitored the patient for moderate sedation.  ANESTHESIA/SEDATION: Sedation time: 10 min  PROCEDURE: The procedure was explained to the patient. The risks and benefits of the procedure were discussed and the patient's questions were addressed. Informed consent was obtained from the patient. The patient was placed prone on CT scan. Images of the pelvis were obtained. The right side of back was prepped and draped in sterile fashion. The skin and right posterior iliac bone were anesthetized with 1% lidocaine. 11 gauge bone needle was directed into the right iliac bone with CT guidance. Two aspirates and one  core biopsy obtained.  FINDINGS: Needle directed into the right iliac bone. Subtle areas of lucency throughout the pelvic bones. Findings are consistent with history of myeloma.  COMPLICATIONS: None  IMPRESSION: CT guided bone marrow aspirates and core biopsy.   Electronically Signed   By: Markus Daft M.D.   On: 03/17/2014 13:07   EXAM:  General appearance: Alert, in no apparent distress  Resp: CTA without rales, rhonchi, or wheezes  Cardio: RRR without murmur or rub  GI: + BS, soft and nontender  Extremities: No edema  Access: AVF @ LUA with BFR 400  Dialysis Orders: [facility]  3 hrs 400/A1.5 64.5 kg 3K/2.5Ca AVF @ LUA Heparin none  Aranesp 160 mcg on Tues Hectorol 2 mcg No Venofer  Assessment/Plan: 1. Severe anemia - Hgb 7.9, s/p 3 U PRBCs 6/13, but refuses additional blood today; most likely sec to MM, although Heme + stool, no further studies per GI, Aranesp 200 mcg 6/13.  2. IgG lambda myeloma - diagnosed 03/2008, s/p chemo in [place]; bone marrow biopsy per IR yesterday (followed Dr. Marin Olp).  3. ESRD - HD on TTS @ [place], K 3.8. HD today. 4. HTN/Volume - BP 122/82 on Hydralazine, Nifedipine; wt 64.6, close to EDW.  5. Sec HPT - Ca 8.6 (9.9 corrected), P 5.8; Hectorol 2 mcg, Renvela with meals.  6. Nutrition - Alb 2.4, currently NPO, vitamin.  LOS: 7 days   [name],CHARLES 03/18/2014,8:23 AM   Pt seen, examined and agree w A/P as above.  Kelly Splinter MD pager [phone]    cell [phone] 03/18/2014, 12:08 PM

## 2014-03-18 NOTE — Progress Notes (Signed)
Pt continues to refuse new IV access site. Per pt, requests to keep old IV. Will continue to monitor. Velora Mediate

## 2014-03-19 LAB — TYPE AND SCREEN
ABO/RH(D): O POS
Antibody Screen: NEGATIVE
UNIT DIVISION: 0
Unit division: 0
Unit division: 0

## 2014-03-22 ENCOUNTER — Encounter: Payer: Self-pay | Admitting: Hematology & Oncology

## 2014-03-23 ENCOUNTER — Telehealth: Payer: Self-pay | Admitting: Internal Medicine

## 2014-03-23 ENCOUNTER — Encounter: Payer: Self-pay | Admitting: Hematology & Oncology

## 2014-03-23 ENCOUNTER — Telehealth: Payer: Self-pay | Admitting: Hematology & Oncology

## 2014-03-23 NOTE — Telephone Encounter (Signed)
S/W PATIENT AND GAVE AN NP APPT FOR 06/24 @ 1:30 W/DR. CHISM.  REFERRING DR. Marin Olp DX- IgG LAMBA MYELOMA

## 2014-03-23 NOTE — Telephone Encounter (Signed)
[name] aware pt needs to be seen within 2 days. Pt doesn't want to come out here it's too far to drive and she has dialysis 3 x's week. She will call pt to schedule, she has an opening tomorrow

## 2014-03-24 ENCOUNTER — Encounter: Payer: Self-pay | Admitting: Internal Medicine

## 2014-03-24 ENCOUNTER — Other Ambulatory Visit (HOSPITAL_BASED_OUTPATIENT_CLINIC_OR_DEPARTMENT_OTHER): Payer: Medicare Other

## 2014-03-24 ENCOUNTER — Other Ambulatory Visit: Payer: Self-pay | Admitting: Internal Medicine

## 2014-03-24 ENCOUNTER — Ambulatory Visit: Payer: Medicare Other

## 2014-03-24 ENCOUNTER — Ambulatory Visit (HOSPITAL_COMMUNITY)
Admission: RE | Admit: 2014-03-24 | Discharge: 2014-03-24 | Disposition: A | Discharge status: Home / Self Care | Payer: Medicare Other | Source: Ambulatory Visit | Attending: Internal Medicine | Admitting: Internal Medicine

## 2014-03-24 ENCOUNTER — Telehealth: Payer: Self-pay | Admitting: Internal Medicine

## 2014-03-24 ENCOUNTER — Ambulatory Visit (HOSPITAL_BASED_OUTPATIENT_CLINIC_OR_DEPARTMENT_OTHER): Payer: Medicare Other | Admitting: Internal Medicine

## 2014-03-24 ENCOUNTER — Telehealth: Payer: Self-pay | Admitting: Medical Oncology

## 2014-03-24 VITALS — BP 138/85 | HR 75 | Temp 98.8°F | Resp 18 | Ht 67.0 in | Wt 148.5 lb

## 2014-03-24 DIAGNOSIS — K625 Hemorrhage of anus and rectum: Secondary | ICD-10-CM

## 2014-03-24 DIAGNOSIS — N186 End stage renal disease: Secondary | ICD-10-CM

## 2014-03-24 DIAGNOSIS — C9000 Multiple myeloma not having achieved remission: Secondary | ICD-10-CM

## 2014-03-24 DIAGNOSIS — D61818 Other pancytopenia: Secondary | ICD-10-CM

## 2014-03-24 DIAGNOSIS — D62 Acute posthemorrhagic anemia: Secondary | ICD-10-CM

## 2014-03-24 DIAGNOSIS — Z992 Dependence on renal dialysis: Secondary | ICD-10-CM

## 2014-03-24 DIAGNOSIS — D649 Anemia, unspecified: Secondary | ICD-10-CM

## 2014-03-24 LAB — COMPREHENSIVE METABOLIC PANEL (CC13)
ALBUMIN: 2.3 g/dL — AB (ref 3.5–5.0)
ALK PHOS: 34 U/L — AB (ref 40–150)
ALT: 11 U/L (ref 0–55)
AST: 25 U/L (ref 5–34)
Anion Gap: 2 mEq/L — ABNORMAL LOW (ref 3–11)
BILIRUBIN TOTAL: 0.46 mg/dL (ref 0.20–1.20)
BUN: 31.8 mg/dL — ABNORMAL HIGH (ref 7.0–26.0)
CO2: 29 meq/L (ref 22–29)
Calcium: 7.7 mg/dL — ABNORMAL LOW (ref 8.4–10.4)
Chloride: 97 mEq/L — ABNORMAL LOW (ref 98–109)
Creatinine: 8.3 mg/dL (ref 0.6–1.1)
Glucose: 119 mg/dl (ref 70–140)
POTASSIUM: 4.2 meq/L (ref 3.5–5.1)
Sodium: 128 mEq/L — ABNORMAL LOW (ref 136–145)
TOTAL PROTEIN: 14.7 g/dL — AB (ref 6.4–8.3)

## 2014-03-24 LAB — CBC WITH DIFFERENTIAL/PLATELET
BASO%: 0.3 % (ref 0.0–2.0)
Basophils Absolute: 0 10*3/uL (ref 0.0–0.1)
EOS%: 5.8 % (ref 0.0–7.0)
Eosinophils Absolute: 0.2 10*3/uL (ref 0.0–0.5)
HCT: 22.7 % — ABNORMAL LOW (ref 34.8–46.6)
HEMOGLOBIN: 6.9 g/dL — AB (ref 11.6–15.9)
LYMPH%: 42.3 % (ref 14.0–49.7)
MCH: 28.8 pg (ref 25.1–34.0)
MCHC: 30.4 g/dL — ABNORMAL LOW (ref 31.5–36.0)
MCV: 94.6 fL (ref 79.5–101.0)
MONO#: 0.3 10*3/uL (ref 0.1–0.9)
MONO%: 9.9 % (ref 0.0–14.0)
NEUT#: 1.4 10*3/uL — ABNORMAL LOW (ref 1.5–6.5)
NEUT%: 41.7 % (ref 38.4–76.8)
NRBC: 0 % (ref 0–0)
Platelets: 72 10*3/uL — ABNORMAL LOW (ref 145–400)
RBC: 2.4 10*6/uL — AB (ref 3.70–5.45)
RDW: 18.1 % — AB (ref 11.2–14.5)
WBC: 3.5 10*3/uL — AB (ref 3.9–10.3)
lymph#: 1.5 10*3/uL (ref 0.9–3.3)

## 2014-03-24 LAB — ABO/RH: ABO/RH(D): O POS

## 2014-03-24 LAB — TECHNOLOGIST REVIEW

## 2014-03-24 LAB — LACTATE DEHYDROGENASE (CC13): LDH: 236 U/L (ref 125–245)

## 2014-03-24 NOTE — Telephone Encounter (Signed)
, °

## 2014-03-24 NOTE — Patient Instructions (Signed)
Multiple Myeloma Multiple myeloma is the most common cancer of bone. It is caused by the uncontrolled multiplication of a type of white blood cell in the marrow. This white blood cell is called a plasma cell. This means the bone marrow is overworking producing plasma cells. Soon these overproduced cells begin to take up room in the marrow that is needed by other cells. This means that there are soon not enough red or white blood cells or platelets. Not enough red cells mean that the person is anemic. There are not enough red blood cells to carry oxygen around the body. There are not enough white blood cells to fight disease. This causes the person with multiple myeloma to not feel well. There is also bone pain through much of the body. SYMPTOMS  Anemia causes fatigue (tiredness) and weakness.  Back pain is common. This is from fractures (break in bones) caused by damage to the bones of the back.  Lack of white blood cells makes infection more likely.  Bleeding is a common problem from lack of the cells (platelets). Platelets help blood clots form. This may show up as bleeding from any place. Commonly this shows up as bleeding from the nose or gums.  Fractures (bone breaks) are more common anywhere. The back and ribs are the most commonly fractured areas. DIAGNOSIS  This tumor is often suggested by blood tests. Often doing a bone marrow sample makes the diagnosis (learning what is wrong). This is a test performed by taking a small sample of bone with a small needle. This bone often comes from the sternum (breast bone). This sample is sent to a pathologist (a specialist in looking at tissue under a microscope). After looking at the sample under the microscope, the pathologist is able to make a diagnosis of the problem. X-rays may also show boney changes. TREATMENT   Occasionally, anti-cancer medications may be used with multiple myeloma. Your caregiver can discuss this with you.  Medications can  also be given to help with the bone pain.  There is no cure for multiple myeloma. Lifestyle changes can add years of quality living. HOME CARE INSTRUCTIONS  Often there is no specific treatment for multiple myeloma. Most of the treatment consists of adjustments in dietary and living activities. Some of these changes include:  Your dietitian or caregiver helping you with your dietary questions.  Taking iron and vitamins as prescribed by your caregiver.  Eating a well balanced diet.  Staying active, but follow restrictions suggested by your caregiver. Avoiding heavy lifting (more than 10 pounds) and activities that cause increased pain.  Drinking plenty of water.  Using back braces and a cane may help with some of the boney pain. SEEK IMMEDIATE MEDICAL CARE IF:  You develop severe, uncontrolled boney pain.  You or your family notices confusion, problems with decision-making or inability to stay awake.  You notice increased urination or constipation.  You notice problems holding your water or stool.  You have numbness or loss of control of your extremities (arms/hands or legs/feet). Document Released: 06/12/2001 Document Revised: 12/10/2011 Document Reviewed: 09/12/2008 Exit[employer] Patient Information 2015 [employer]. This information is not intended to replace advice given to you by your health care provider. Make sure you discuss any questions you have with your health care provider.  

## 2014-03-24 NOTE — Telephone Encounter (Signed)
I called Dr. Virgina Jock office and left a message regarding medical records. Pt has relocated to [facility] and records needed ASAP for treatment. I faxed a release of information to [phone]

## 2014-03-24 NOTE — Progress Notes (Signed)
[name] OFFICE PROGRESS NOTE  No PCP Per Patient No address on file  DIAGNOSIS: Multiple myeloma - Plan: Care order/instruction, 0.9 %  sodium chloride infusion, sodium chloride 0.9 % injection 10 mL, heparin lock flush 100 unit/mL, heparin lock flush 100 unit/mL, sodium chloride 0.9 % injection 3 mL, Transfuse RBC, acetaminophen (TYLENOL) tablet 650 mg, diphenhydrAMINE (BENADRYL) capsule 25 mg, CBC with Differential, Basic metabolic panel (Bmet) - [facility], Lactate dehydrogenase (LDH) - [facility], LORazepam (ATIVAN) 0.5 MG tablet, dexamethasone (DECADRON) 4 MG tablet, ondansetron (ZOFRAN) 8 MG tablet, prochlorperazine (COMPAZINE) 10 MG tablet, CANCELED: Type and screen, CANCELED: Prepare RBC, DISCONTINUED: ondansetron (ZOFRAN) 8 MG tablet, DISCONTINUED: prochlorperazine (COMPAZINE) 10 MG tablet, DISCONTINUED: dexamethasone (DECADRON) 4 MG tablet, DISCONTINUED: acyclovir (ZOVIRAX) 400 MG tablet  Pancytopenia  ESRD (end stage renal disease)  BRBPR (bright red blood per rectum)  Acute blood loss anemia  Chief Complaint  Patient presents with  . Multiple Myeloma    CURRENT TREATMENT: None.  Planning to start Carfilzomib, lenalidomide and dexamethasone for relapsed multiple myeloma per NEJM 372;2, January 8,2015.      Multiple myeloma   03/30/2008 Initial Diagnosis Multiple myeloma   03/30/2008 - 05/01/2012 Chemotherapy Revlimid/Dex then Velcade (started on 07/13/2011 ending on 09/14/2011) without response.  Managed by Dr. Chauncey Fischer of [place], [place] [address]   07/24/2010 -  Chemotherapy Received zometa  intermittently over one year.  Creatinine was 1.0 on 07/24/2010. 1.7 on 08/10/2011.    05/01/2012 - 09/01/2012 Chemotherapy Starte Pomalidomide s/p 4 cycles. Referred to [facility] for consideration of ASCT.     10/08/2012 Bone Marrow Biopsy Normocellular marrow with residual/recurrently plasma cell myeloma, 60-70 % of overall marrow celluarity.  Flow: c/w plasma cell neoplasm,  plasma cell infiltrate comprises 60-70 % of overall marrow cellularity; FISH: + for 1 q21/CKS1B gain; RB1 (13q14)   10/08/2012 Bone Marrow Biopsy Continued... Cytogenetics showed normal female karyotype.    10/29/2012 Tumor Marker High IgG (6019), high lamda free light chain (409.32) with low IgA, IgM and kappa free light chain.    04/21/2013 Imaging Skeletal survey.  No suspcious lytic or blastic lesions visualized.  Severe multilevel degenerative disc disease in the cervical spine.    04/29/2013 Imaging PeT. No discrete suspicious focus of FDG uptake identified.    05/20/2013 - 06/09/2013 Chemotherapy Started carfilxomib 39 mg on 08/25, 08/26, day 8 (06/02/13), day 9 on 06/03/2013 and on 06/09/2013.     12/03/2013 Treatment Plan Change Last office visit with Dr. Glenna Durand.  She decline further chemotherapy.   Her son passed away and she relocated to [place] to stay with her daughter [name].    03/11/2014 - 03/17/2014 Hospital Admission Admitted for low hemoglobin. Dr. Burney Gauze consulted.  SPEP,  UPEP and Bone marrow biopsy obtained.    IgG 11,300. M-spike 7.52, kappa lamda ratio 0 with lambda free light chains totaling 1260.    03/12/2014 Imaging Skeletal survey. 1. Small lytic lesions within the skull and right scapula. 2. Degenerative changes in the spine and knees, right greater than left. 3. Cardiomegaly without pulmonary edema.   03/17/2014 Bone Marrow Biopsy Hypercellular bone marrow tih extensive involvment by plasma cell neoplasm (Plasma cells 85%). FISH: Presence of 2 copies of the IGH/FGFR3, ATM, CCND1/IGH, and p53 probes.  Loss of 13q34 probes in 40% of cells. gain of chromose 12 and either loss of chorm   03/25/2014 -  Chemotherapy Planning Kyprolis 15 mg/m2 plus dexamethasone weekly plus revlimid (30m) daily on 06/29.   INTERVAL HISTORY:  [name] 73 y.o. female with a history of IgG Lambda Multiple myeloma complicated by non-compliance, ESRDz on H/D(Tues, Thursday and Sat) is here for follow  up.  She was initially consulted by [place] on 6/17.  We recommended re-staging multiple myeloma markers and skeletal survey (which is as noted below).  Today, she is accompanied by her daughter [name] who she has lived with for the past 2 months.  She moved from [place] a few months ago once her son passed away.  She denies fevers or chills.  She had a recent hospitalization (6/11 - 03/17/2014) due to profound anemia.  She was transfused packed RBCs doing this admission. Since discharge, she denies fevers or chills or acute shortness of breath.  She does report lower extremity weakness and fatigue.  She states that she sleeps a lot.   MEDICAL HISTORY: Past Medical History  Diagnosis Date  . Renal disorder   . ESRD (end stage renal disease)   . Hypertension   . Multiple myeloma 2009  . Thyroid disease   . Hyperparathyroidism   . Thrombocytopenia   . Anemia   . Shortness of breath   . Pneumonia   . GERD (gastroesophageal reflux disease)   . Arthritis     rt knee is stiff    INTERIM HISTORY: has BRBPR (bright red blood per rectum); Acute blood loss anemia; ESRD (end stage renal disease); Multiple myeloma; Pancytopenia; and HTN (hypertension) on her problem list.    ALLERGIES:  has No Known Allergies.  MEDICATIONS: has a current medication list which includes the following prescription(s): acetaminophen-codeine, amlodipine, cyproheptadine, hydralazine, isosorbide mononitrate, metolazone, multiple vitamins-iron, nifedipine, oxybutynin, valacyclovir, aspirin ec, dexamethasone, dextromethorphan-guaifenesin, lenalidomide, lorazepam, ondansetron, prochlorperazine, and sevelamer carbonate.  SURGICAL HISTORY: History reviewed. No pertinent past surgical history.  REVIEW OF SYSTEMS:   Constitutional: Denies fevers, chills or abnormal weight loss Eyes: Denies blurriness of vision Ears, nose, mouth, throat, and face: Denies mucositis or sore throat Respiratory: Denies cough, dyspnea or  wheezes Cardiovascular: Denies palpitation, chest discomfort or lower extremity swelling Gastrointestinal:  Denies nausea, heartburn or change in bowel habits Skin: Denies abnormal skin rashes Lymphatics: Denies new lymphadenopathy or easy bruising Neurological:Denies numbness, tingling or new weaknesses Behavioral/Psych: Mood is stable, no new changes  All other systems were reviewed with the patient and are negative.  PHYSICAL EXAMINATION: ECOG PERFORMANCE STATUS: 1 - Symptomatic but completely ambulatory  Blood pressure 138/85, pulse 75, temperature 98.8 F (37.1 C), temperature source Oral, resp. rate 18, height _0  (1.702 m), weight 148 lb 8 oz (67.359 kg), SpO2 100.00%.  GENERAL:alert, no distress and comfortable; chronically ill appearing but easily ambulatory to the exam bench.  SKIN: skin color, texture, turgor are normal, no rashes or significant lesions EYES: normal, Conjunctiva are pink and non-injected, sclera clear OROPHARYNX:no exudate, no erythema and lips, buccal mucosa, and tongue normal  NECK: supple, thyroid normal size, non-tender, without nodularity LYMPH:  no palpable lymphadenopathy in the cervical, axillary or supraclavicular LUNGS: clear to auscultation with normal breathing effort, no wheezes or rhonchi HEART: regular rate & rhythm and no murmurs and no lower extremity edema; L AVF ABDOMEN:abdomen soft, non-tender and normal bowel sounds Musculoskeletal:no cyanosis of digits and no clubbing  NEURO: alert & oriented x 3 with fluent speech, no focal motor/sensory deficits  Labs:  Lab Results  Component Value Date   WBC 3.5* 03/24/2014   HGB 6.9* 03/24/2014   HCT 22.7* 03/24/2014   MCV 94.6 03/24/2014   PLT 72* 03/24/2014  NEUTROABS 1.4* 03/24/2014      Chemistry      Component Value Date/Time   NA 128* 03/24/2014 1355   NA 125* 03/18/2014 0500   K 4.2 03/24/2014 1355   K 3.8 03/18/2014 0500   CL 92* 03/18/2014 0500   CO2 29 03/24/2014 1355   CO2 25  03/18/2014 0500   BUN 31.8* 03/24/2014 1355   BUN 31* 03/18/2014 0500   CREATININE 8.3 Repeated and Verified* 03/24/2014 1355   CREATININE 8.22* 03/18/2014 0500      Component Value Date/Time   CALCIUM 7.7* 03/24/2014 1355   CALCIUM 8.6 03/18/2014 0500   ALKPHOS 34* 03/24/2014 1355   ALKPHOS 37* 03/11/2014 1658   AST 25 03/24/2014 1355   AST 29 03/11/2014 1658   ALT 11 03/24/2014 1355   ALT 11 03/11/2014 1658   BILITOT 0.46 03/24/2014 1355   BILITOT 0.4 03/11/2014 1658       Basic Metabolic Panel:  Recent Labs Lab 03/24/14 1355  NA 128*  K 4.2  CO2 29  GLUCOSE 119  BUN 31.8*  CREATININE 8.3 Repeated and Verified*  CALCIUM 7.7*   GFR Estimated Creatinine Clearance: 5.7 ml/min (by C-G formula based on Cr of 8.3 Repeated and Verified). Liver Function Tests:  Recent Labs Lab 03/24/14 1355  AST 25  ALT 11  ALKPHOS 34*  BILITOT 0.46  PROT 14.7*  ALBUMIN 2.3*   CBC:  Recent Labs Lab 03/24/14 1355  WBC 3.5*  NEUTROABS 1.4*  HGB 6.9*  HCT 22.7*  MCV 94.6  PLT 72*    Anemia work up No results found for this basename: VITAMINB12, FOLATE, FERRITIN, TIBC, IRON, RETICCTPCT,  in the last 72 hours  Studies:  No results found.   RADIOGRAPHIC STUDIES: Ct Biopsy  03/17/2014   CLINICAL DATA:  73 year old with history of myeloma and anemia.  EXAM: CT GUIDED BONE MARROW ASPIRATES AND BIOPSY  Physician: Stephan Minister. Henn, MD  MEDICATIONS: 2 mg Versed, 50 mcg fentanyl. A radiology nurse monitored the patient for moderate sedation.  ANESTHESIA/SEDATION: Sedation time: 10 min  PROCEDURE: The procedure was explained to the patient. The risks and benefits of the procedure were discussed and the patient's questions were addressed. Informed consent was obtained from the patient. The patient was placed prone on CT scan. Images of the pelvis were obtained. The right side of back was prepped and draped in sterile fashion. The skin and right posterior iliac bone were anesthetized with 1% lidocaine.  11 gauge bone needle was directed into the right iliac bone with CT guidance. Two aspirates and one core biopsy obtained.  FINDINGS: Needle directed into the right iliac bone. Subtle areas of lucency throughout the pelvic bones. Findings are consistent with history of myeloma.  COMPLICATIONS: None  IMPRESSION: CT guided bone marrow aspirates and core biopsy.   Electronically Signed   By: Markus Daft M.D.   On: 03/17/2014 13:07   Dg Bone Survey Met  03/12/2014   CLINICAL DATA:  Myeloma.  Right knee pain.  EXAM: METASTATIC BONE SURVEY  COMPARISON:  None.  FINDINGS: Multiple images are performed of the axial and appendicular skeleton.  Multiple small lytic lesions are identified within the skull consistent with myeloma. A small lytic lesion is identified within the right scapula.  Degenerative changes are identified within the mid cervical spine. No suspicious lytic or blastic lesions are identified within the spine, or long bones. Surgical clips are identified in the left upper arm. Degenerative changes are seen in the  knees bilaterally, right greater than left.  The heart is mildly enlarged. There are areas of atelectasis within the right lung. No focal consolidations or pulmonary edema.  IMPRESSION: 1. Small lytic lesions within the skull and right scapula. 2. Degenerative changes in the spine and knees, right greater than left. 3. Cardiomegaly without pulmonary edema.   Electronically Signed   By: Shon Hale M.D.   On: 03/12/2014 10:13   PATHOLOGY: Diagnosis Bone Marrow, Aspirate,Biopsy, and Clot, right iliac - HYPERCELLULAR BONE MARROW WITH EXTENSIVE INVOLVEMENT BY PLASMA CELL NEOPLASM (PLASMA CELLS 85%). PERIPHERAL BLOOD: - NORMOCYTIC-NORMOCHROMIC ANEMIA. - THROMBOCYTOPENIA. Susanne Greenhouse MD Pathologist, Electronic Signature (Case signed 03/19/2014)  ASSESSMENT: [name] 73 y.o. female with a history of Multiple myeloma - Plan: Care order/instruction, 0.9 %  sodium chloride infusion, sodium  chloride 0.9 % injection 10 mL, heparin lock flush 100 unit/mL, heparin lock flush 100 unit/mL, sodium chloride 0.9 % injection 3 mL, Transfuse RBC, acetaminophen (TYLENOL) tablet 650 mg, diphenhydrAMINE (BENADRYL) capsule 25 mg, CBC with Differential, Basic metabolic panel (Bmet) - [facility], Lactate dehydrogenase (LDH) - [facility], LORazepam (ATIVAN) 0.5 MG tablet, dexamethasone (DECADRON) 4 MG tablet, ondansetron (ZOFRAN) 8 MG tablet, prochlorperazine (COMPAZINE) 10 MG tablet, CANCELED: Type and screen, CANCELED: Prepare RBC, DISCONTINUED: ondansetron (ZOFRAN) 8 MG tablet, DISCONTINUED: prochlorperazine (COMPAZINE) 10 MG tablet, DISCONTINUED: dexamethasone (DECADRON) 4 MG tablet, DISCONTINUED: acyclovir (ZOVIRAX) 400 MG tablet  Pancytopenia  ESRD (end stage renal disease)  BRBPR (bright red blood per rectum)  Acute blood loss anemia   PLAN:   1. IgG Lambda MM, rapidly progressive.  --I reviewed extensively her medical records from [place], [place] and her recent work out.  Her Bone marrow is packed with 85% plasma cells likely explaining her refractory anemia and pancytopenia.  Her protein is 14.7.  Her case has been difficult due to a history of non-compliance.  She stated that she stopped her recent treatment in [place] due to being hospitalized.  Per records from her primary oncologist's office, it was likely due to her kidney failure due to progression of her multiple myeloma.  Her last treatment is as noted above.  Considering she received only one cycle of carfilzomib plus dexamethasone, I think it is prudent to start her back.  I discussed with her starting her back on the following regiment based on Nicole Kindred et. Al, NEJM, 2015).   *Carfilzomib (15 mg/m2) on days 1, 2, 8, 9, 15, 16  *Lenalidomide 69m daily (please take follow H/D on Tue/th/Sa).  *Dexamethasone 40 mg daily.   We will obtain consent for treatment on 03/29/2014.   Patient voiced understanding that without treatment, her MM will likely cause  refractory anemia.   2. Pancytopenia with anemia likely multifactorial. --Secondary to #1.  We will start on aranesp and transfuse packed RBCs to maintain a hemoglobin greater than 8.  We will give 2 units of packed RBCs on 06/26.  She cannot have transfusions on the days of her dialysis (Tues, Thurs and Sat).  Her ANC is 1,400.  She was advised extensively on symptoms of anemia and neutropenia.   3. ESRDz.  --Likely secondary to #1.   Continue hemodialysis on Tues, Thursday and Sat.    4. Psuedohyponatremia.  --Likely due to #1. Treat underlying disorder.    5. History of Noncompliance.   6. Follow up.   --Patient will follow up in our office on 06/29 for consideration on starting Carfilzomib, lenalidomide and dexamethasone.  She will require re-enrollment in the revlimid program.  She was provided anti-emetics and acyclovir.   All questions were answered. The patient knows to call the clinic with any problems, questions or concerns. We can certainly see the patient much sooner if necessary.  I spent 60 minutes counseling the patient face to face. The total time spent in the appointment was 80 minutes.    CHISM, DAVID, MD 03/25/2014 1:02 PM

## 2014-03-24 NOTE — Progress Notes (Signed)
Checked in new pt with no financial concerns. °

## 2014-03-25 ENCOUNTER — Telehealth: Payer: Self-pay

## 2014-03-25 ENCOUNTER — Telehealth: Payer: Self-pay | Admitting: Internal Medicine

## 2014-03-25 MED ORDER — ONDANSETRON HCL 8 MG PO TABS: 8.0000 mg | ORAL_TABLET | Freq: Two times a day (BID) | ORAL | Status: DC

## 2014-03-25 MED ORDER — LORAZEPAM 0.5 MG PO TABS: 0.5000 mg | ORAL_TABLET | Freq: Four times a day (QID) | ORAL | Status: DC | PRN

## 2014-03-25 MED ORDER — PROCHLORPERAZINE MALEATE 10 MG PO TABS: 10.0000 mg | ORAL_TABLET | Freq: Four times a day (QID) | ORAL | Status: DC | PRN

## 2014-03-25 MED ORDER — LENALIDOMIDE 5 MG PO CAPS: 5.0000 mg | ORAL_CAPSULE | Freq: Every day | ORAL | Status: DC

## 2014-03-25 MED ORDER — DEXAMETHASONE 4 MG PO TABS: ORAL_TABLET | ORAL | Status: DC

## 2014-03-25 MED ORDER — ASPIRIN EC 325 MG PO TBEC: 325.0000 mg | DELAYED_RELEASE_TABLET | Freq: Every day | ORAL | Status: DC

## 2014-03-25 MED ORDER — ACYCLOVIR 400 MG PO TABS: 400.0000 mg | ORAL_TABLET | Freq: Every day | ORAL | Status: DC

## 2014-03-25 NOTE — Telephone Encounter (Signed)
S/w reene that rx for ativan is ready for pickup. They will pick up tomorrow at infusion appt. Also told reene that there is paperwork that needs to be signed to re-register pt with [facility] for her revlimid, this can be done at her infusion appt as well. reene expressed understanding.

## 2014-03-25 NOTE — Telephone Encounter (Signed)
ADDED INF APPTS TO START 6/29 AND CHED APPT FOR 6/26. S/W DTR AND PT WILL GET NEW SCHEDULE WHEN SHE COMES IN TOMORROW @ 9:30AM FOR CHED CLASS. DTR AWARE PT WILL ALSO KEEP 12:30PM CLASS ON TOMORROW.

## 2014-03-26 ENCOUNTER — Encounter: Payer: Self-pay | Admitting: *Deleted

## 2014-03-26 ENCOUNTER — Other Ambulatory Visit: Payer: Medicare Other

## 2014-03-26 ENCOUNTER — Ambulatory Visit (HOSPITAL_BASED_OUTPATIENT_CLINIC_OR_DEPARTMENT_OTHER): Payer: Medicare Other

## 2014-03-26 ENCOUNTER — Other Ambulatory Visit: Payer: Self-pay | Admitting: Internal Medicine

## 2014-03-26 VITALS — BP 163/84 | HR 64 | Temp 98.0°F | Resp 18

## 2014-03-26 DIAGNOSIS — D649 Anemia, unspecified: Secondary | ICD-10-CM | POA: Diagnosis not present

## 2014-03-26 DIAGNOSIS — C9000 Multiple myeloma not having achieved remission: Secondary | ICD-10-CM

## 2014-03-26 LAB — TISSUE HYBRIDIZATION (BONE MARROW)-NCBH

## 2014-03-26 LAB — CHROMOSOME ANALYSIS, BONE MARROW

## 2014-03-26 LAB — PREPARE RBC (CROSSMATCH)

## 2014-03-26 MED ORDER — DIPHENHYDRAMINE HCL 25 MG PO CAPS
ORAL_CAPSULE | ORAL | Status: AC
  Filled 2014-03-26: qty 1

## 2014-03-26 MED ORDER — DEXAMETHASONE 4 MG PO TABS: ORAL_TABLET | ORAL | Status: DC

## 2014-03-26 MED ORDER — ACETAMINOPHEN 325 MG PO TABS
ORAL_TABLET | ORAL | Status: AC
  Filled 2014-03-26: qty 2

## 2014-03-26 MED ORDER — DIPHENHYDRAMINE HCL 25 MG PO CAPS
25.0000 mg | ORAL_CAPSULE | Freq: Once | ORAL | Status: AC
  Administered 2014-03-26: 25 mg via ORAL

## 2014-03-26 MED ORDER — SODIUM CHLORIDE 0.9 % IV SOLN
250.0000 mL | Freq: Once | INTRAVENOUS | Status: AC
  Administered 2014-03-26: 250 mL via INTRAVENOUS

## 2014-03-26 MED ORDER — ACETAMINOPHEN 325 MG PO TABS
650.0000 mg | ORAL_TABLET | Freq: Once | ORAL | Status: AC
  Administered 2014-03-26: 650 mg via ORAL

## 2014-03-26 NOTE — Patient Instructions (Signed)
Blood Transfusion Information WHAT IS A BLOOD TRANSFUSION? A transfusion is the replacement of blood or some of its parts. Blood is made up of multiple cells which provide different functions.  Red blood cells carry oxygen and are used for blood loss replacement.  White blood cells fight against infection.  Platelets control bleeding.  Plasma helps clot blood.  Other blood products are available for specialized needs, such as hemophilia or other clotting disorders. BEFORE THE TRANSFUSION  Who gives blood for transfusions?   You may be able to donate blood to be used at a later date on yourself (autologous donation).  Relatives can be asked to donate blood. This is generally not any safer than if you have received blood from a stranger. The same precautions are taken to ensure safety when a relative's blood is donated.  Healthy volunteers who are fully evaluated to make sure their blood is safe. This is blood bank blood. Transfusion therapy is the safest it has ever been in the practice of medicine. Before blood is taken from a donor, a complete history is taken to make sure that person has no history of diseases nor engages in risky social behavior (examples are intravenous drug use or sexual activity with multiple partners). The donor's travel history is screened to minimize risk of transmitting infections, such as malaria. The donated blood is tested for signs of infectious diseases, such as HIV and hepatitis. The blood is then tested to be sure it is compatible with you in order to minimize the chance of a transfusion reaction. If you or a relative donates blood, this is often done in anticipation of surgery and is not appropriate for emergency situations. It takes many days to process the donated blood. RISKS AND COMPLICATIONS Although transfusion therapy is very safe and saves many lives, the main dangers of transfusion include:   Getting an infectious disease.  Developing a  transfusion reaction. This is an allergic reaction to something in the blood you were given. Every precaution is taken to prevent this. The decision to have a blood transfusion has been considered carefully by your caregiver before blood is given. Blood is not given unless the benefits outweigh the risks. AFTER THE TRANSFUSION  Right after receiving a blood transfusion, you will usually feel much better and more energetic. This is especially true if your red blood cells have gotten low (anemic). The transfusion raises the level of the red blood cells which carry oxygen, and this usually causes an energy increase.  The nurse administering the transfusion will monitor you carefully for complications. HOME CARE INSTRUCTIONS  No special instructions are needed after a transfusion. You may find your energy is better. Speak with your caregiver about any limitations on activity for underlying diseases you may have. SEEK MEDICAL CARE IF:   Your condition is not improving after your transfusion.  You develop redness or irritation at the intravenous (IV) site. SEEK IMMEDIATE MEDICAL CARE IF:  Any of the following symptoms occur over the next 12 hours:  Shaking chills.  You have a temperature by mouth above 102 F (38.9 C), not controlled by medicine.  Chest, back, or muscle pain.  People around you feel you are not acting correctly or are confused.  Shortness of breath or difficulty breathing.  Dizziness and fainting.  You get a rash or develop hives.  You have a decrease in urine output.  Your urine turns a dark color or changes to pink, red, or brown. Any of the following   symptoms occur over the next 10 days:  You have a temperature by mouth above 102 F (38.9 C), not controlled by medicine.  Shortness of breath.  Weakness after normal activity.  The white part of the eye turns yellow (jaundice).  You have a decrease in the amount of urine or are urinating less often.  Your  urine turns a dark color or changes to pink, red, or brown. Document Released: 09/14/2000 Document Revised: 12/10/2011 Document Reviewed: 05/03/2008 [facility] Patient Information 2015 [place], [place]. This information is not intended to replace advice given to you by your health care provider. Make sure you discuss any questions you have with your health care provider.  It has been a pleasure to serve you today!

## 2014-03-26 NOTE — Progress Notes (Signed)
Met with patient and her daughter for chemo class today.  Reviewed side effects of Revlimid, dexamethasone and Kyprolis including but not limited to myelosuppression, diarrhea, neuropathy.  Daughter very receptive with very good questions.  Teachback done.

## 2014-03-27 LAB — TYPE AND SCREEN
ABO/RH(D): O POS
Antibody Screen: NEGATIVE
UNIT DIVISION: 0
Unit division: 0

## 2014-03-29 ENCOUNTER — Telehealth: Payer: Self-pay | Admitting: *Deleted

## 2014-03-29 ENCOUNTER — Ambulatory Visit (HOSPITAL_BASED_OUTPATIENT_CLINIC_OR_DEPARTMENT_OTHER): Payer: Medicare Other

## 2014-03-29 ENCOUNTER — Other Ambulatory Visit: Payer: Self-pay | Admitting: *Deleted

## 2014-03-29 ENCOUNTER — Other Ambulatory Visit (HOSPITAL_BASED_OUTPATIENT_CLINIC_OR_DEPARTMENT_OTHER): Payer: Medicare Other

## 2014-03-29 ENCOUNTER — Ambulatory Visit (HOSPITAL_BASED_OUTPATIENT_CLINIC_OR_DEPARTMENT_OTHER): Payer: Medicare Other | Admitting: Internal Medicine

## 2014-03-29 ENCOUNTER — Encounter: Payer: Self-pay | Admitting: Internal Medicine

## 2014-03-29 ENCOUNTER — Encounter: Payer: Self-pay | Admitting: Hematology & Oncology

## 2014-03-29 VITALS — BP 158/97 | HR 70 | Temp 98.1°F | Resp 18 | Ht 67.0 in | Wt 150.0 lb

## 2014-03-29 DIAGNOSIS — N186 End stage renal disease: Secondary | ICD-10-CM

## 2014-03-29 DIAGNOSIS — C9000 Multiple myeloma not having achieved remission: Secondary | ICD-10-CM

## 2014-03-29 DIAGNOSIS — Z992 Dependence on renal dialysis: Secondary | ICD-10-CM

## 2014-03-29 DIAGNOSIS — E871 Hypo-osmolality and hyponatremia: Secondary | ICD-10-CM

## 2014-03-29 DIAGNOSIS — Z5112 Encounter for antineoplastic immunotherapy: Secondary | ICD-10-CM

## 2014-03-29 DIAGNOSIS — D61818 Other pancytopenia: Secondary | ICD-10-CM

## 2014-03-29 LAB — CBC WITH DIFFERENTIAL/PLATELET
BASO%: 0.9 % (ref 0.0–2.0)
BASOS ABS: 0 10*3/uL (ref 0.0–0.1)
EOS ABS: 0.3 10*3/uL (ref 0.0–0.5)
EOS%: 6.1 % (ref 0.0–7.0)
HCT: 24.9 % — ABNORMAL LOW (ref 34.8–46.6)
HEMOGLOBIN: 8.2 g/dL — AB (ref 11.6–15.9)
LYMPH%: 34 % (ref 14.0–49.7)
MCH: 30.2 pg (ref 25.1–34.0)
MCHC: 32.7 g/dL (ref 31.5–36.0)
MCV: 92.2 fL (ref 79.5–101.0)
MONO#: 0.5 10*3/uL (ref 0.1–0.9)
MONO%: 10.6 % (ref 0.0–14.0)
NEUT%: 48.4 % (ref 38.4–76.8)
NEUTROS ABS: 2.1 10*3/uL (ref 1.5–6.5)
PLATELETS: 103 10*3/uL — AB (ref 145–400)
RBC: 2.7 10*6/uL — ABNORMAL LOW (ref 3.70–5.45)
RDW: 18.1 % — AB (ref 11.2–14.5)
WBC: 4.2 10*3/uL (ref 3.9–10.3)
lymph#: 1.4 10*3/uL (ref 0.9–3.3)

## 2014-03-29 LAB — BASIC METABOLIC PANEL (CC13)
ANION GAP: 5 meq/L (ref 3–11)
BUN: 32.1 mg/dL — AB (ref 7.0–26.0)
CALCIUM: 8.8 mg/dL (ref 8.4–10.4)
CO2: 25 mEq/L (ref 22–29)
CREATININE: 8.3 mg/dL — AB (ref 0.6–1.1)
Chloride: 100 mEq/L (ref 98–109)
GLUCOSE: 91 mg/dL (ref 70–140)
Potassium: 4.8 mEq/L (ref 3.5–5.1)
Sodium: 129 mEq/L — ABNORMAL LOW (ref 136–145)

## 2014-03-29 LAB — LACTATE DEHYDROGENASE (CC13): LDH: 297 U/L — ABNORMAL HIGH (ref 125–245)

## 2014-03-29 MED ORDER — SODIUM CHLORIDE 0.9 % IV SOLN
Freq: Once | INTRAVENOUS | Status: AC
  Administered 2014-03-29: 15:00:00 via INTRAVENOUS

## 2014-03-29 MED ORDER — DEXTROSE 5 % IV SOLN
15.0000 mg/m2 | Freq: Once | INTRAVENOUS | Status: AC
  Administered 2014-03-29: 26 mg via INTRAVENOUS
  Filled 2014-03-29: qty 13

## 2014-03-29 MED ORDER — LENALIDOMIDE 5 MG PO CAPS: 5.0000 mg | ORAL_CAPSULE | Freq: Every day | ORAL | Status: DC

## 2014-03-29 MED ORDER — DEXAMETHASONE SODIUM PHOSPHATE 10 MG/ML IJ SOLN
10.0000 mg | Freq: Once | INTRAMUSCULAR | Status: AC
  Administered 2014-03-29: 10 mg via INTRAVENOUS

## 2014-03-29 MED ORDER — DEXAMETHASONE SODIUM PHOSPHATE 10 MG/ML IJ SOLN
INTRAMUSCULAR | Status: AC
  Filled 2014-03-29: qty 1

## 2014-03-29 MED ORDER — ACYCLOVIR 400 MG PO TABS: 400.0000 mg | ORAL_TABLET | Freq: Every day | ORAL | Status: DC

## 2014-03-29 MED ORDER — ONDANSETRON 8 MG/NS 50 ML IVPB
INTRAVENOUS | Status: AC
  Filled 2014-03-29: qty 8

## 2014-03-29 MED ORDER — ONDANSETRON 8 MG/50ML IVPB (CHCC)
8.0000 mg | Freq: Once | INTRAVENOUS | Status: AC
  Administered 2014-03-29: 8 mg via INTRAVENOUS

## 2014-03-29 NOTE — Progress Notes (Signed)
[facility] OFFICE PROGRESS NOTE  No PCP Per Patient No address on file  DIAGNOSIS: Multiple myeloma - Plan: CBC with Differential, Basic metabolic panel (Bmet) - [facility], CBC with Differential, Basic metabolic panel (Bmet) - [facility], SPEP & IFE with QIG, Kappa/lambda light chains  Pancytopenia  ESRD (end stage renal disease)  Chief Complaint  Patient presents with  . Multiple Myeloma    CURRENT TREATMENT: Carfilzomib 15 mg/m2 on days 1,3, 8, 10,15, 17 (her dialysis is on day 2); Dexamethasone 12 mg weekly on days 1, 8, 15, 22; revlimid 5 mg daily for 28 days.  Started on 03/29/2014.     Multiple myeloma   03/30/2008 Initial Diagnosis Multiple myeloma   03/30/2008 - 05/01/2012 Chemotherapy Revlimid/Dex then Velcade (started on 07/13/2011 ending on 09/14/2011) without response.  Managed by Dr. Chauncey [name] of [place], [place] [id number]   07/24/2010 -  Chemotherapy Received zometa  intermittently over one year.  Creatinine was 1.0 on 07/24/2010. 1.7 on 08/10/2011.    05/01/2012 - 09/01/2012 Chemotherapy Starte Pomalidomide s/p 4 cycles. Referred to [facility] for consideration of AS[facility].     10/08/2012 Bone Marrow Biopsy Normocellular marrow with residual/recurrently plasma cell myeloma, 60-70 % of overall marrow celluarity.  Flow: c/w plasma cell neoplasm, plasma cell infiltrate comprises 60-70 % of overall marrow cellularity; FISH: + for 1 q21/CKS1B gain; RB1 (13q14)   10/08/2012 Bone Marrow Biopsy Continued... Cytogenetics showed normal female karyotype.    10/29/2012 Tumor Marker High IgG (6019), high lamda free light chain (409.32) with low IgA, IgM and kappa free light chain.    04/21/2013 Imaging Skeletal survey.  No suspcious lytic or blastic lesions visualized.  Severe multilevel degenerative disc disease in the cervical spine.    04/29/2013 Imaging PeT. No discrete suspicious focus of FDG uptake identified.    05/20/2013 - 06/09/2013 Chemotherapy Started carfilxomib 39 mg on 08/25,  08/26, day 8 (06/02/13), day 9 on 06/03/2013 and on 06/09/2013.     12/03/2013 Treatment Plan Change Last office visit with Dr. Glenna [name].  She decline further chemotherapy.   Her son passed away and she relocated to [place] to stay with her daughter [name].    03/11/2014 - 03/17/2014 Hospital Admission Admitted for low hemoglobin. Dr. Burney Gauze consulted.  SPEP,  UPEP and Bone marrow biopsy obtained.    IgG 11,300. M-spike 7.52, kappa lamda ratio 0 with lambda free light chains totaling 1260.    03/12/2014 Imaging Skeletal survey. 1. Small lytic lesions within the skull and right scapula. 2. Degenerative changes in the spine and knees, right greater than left. 3. Cardiomegaly without pulmonary edema.   03/17/2014 Bone Marrow Biopsy Hypercellular bone marrow tih extensive involvment by plasma cell neoplasm (Plasma cells 85%). FISH: Presence of 2 copies of the IGH/FGFR3, ATM, CCND1/IGH, and p53 probes.  Loss of 13q34 probes in 40% of cells. gain of chromose 12 and either loss of chorm   03/25/2014 -  Chemotherapy Planning Kyprolis 15 mg/m2 plus dexamethasone weekly plus revlimid (24m) daily on 06/29.     INTERVAL HISTORY: [name]73y.o. female with a history of IgG Lambda Multiple myeloma complicated by non-compliance, ESRDz on H/D(Tues, Thursday and Sat) is here for follow up. She was initially [place] by [place]. We recommended re-staging multiple myeloma markers and skeletal survey (which is as noted below).   Today,she reports feeling much better with improved energy since receiving 2 units of packed RBCs this past Friday. . [place] son passed away. She denies fevers or chills. She had a recent hospitalization (6/11 - 03/17/2014) due to profound anemia. She was transfused packed RBCs doing this admission. Since discharge, she denies fevers or chills or acute shortness of breath.   MEDICAL HISTORY: Past Medical History  Diagnosis Date  .  Renal disorder   . ESRD (end stage renal disease)   . Hypertension   . Multiple myeloma 2009  . Thyroid disease   . Hyperparathyroidism   . Thrombocytopenia   . Anemia   . Shortness of breath   . Pneumonia   . GERD (gastroesophageal reflux disease)   . Arthritis     rt knee is stiff    INTERIM HISTORY: has BRBPR (bright red blood per rectum); Acute blood loss anemia; ESRD (end stage renal disease); Multiple myeloma; Pancytopenia; and HTN (hypertension) on her problem list.    ALLERGIES:  has No Known Allergies.  MEDICATIONS: has a current medication list which includes the following prescription(s): acetaminophen-codeine, amlodipine, aspirin ec, cyproheptadine, dexamethasone, dextromethorphan-guaifenesin, hydralazine, isosorbide mononitrate, lorazepam, metolazone, multiple vitamins-iron, nifedipine, ondansetron, oxybutynin, prochlorperazine, sevelamer carbonate, acyclovir, and lenalidomide.  SURGICAL HISTORY: No past surgical history on file.  REVIEW OF SYSTEMS:   Constitutional: Denies fevers, chills or abnormal weight loss Eyes: Denies blurriness of vision Ears, nose, mouth, throat, and face: Denies mucositis or sore throat Respiratory: Denies cough, dyspnea or wheezes Cardiovascular: Denies palpitation, chest discomfort or lower extremity swelling Gastrointestinal:  Denies nausea, heartburn or change in bowel habits Skin: Denies abnormal skin rashes Lymphatics: Denies new lymphadenopathy or easy bruising Neurological:Denies numbness, tingling or new weaknesses Behavioral/Psych: Mood is stable, no new changes  All other systems were reviewed with the patient and are negative.  PHYSICAL EXAMINATION: ECOG PERFORMANCE STATUS: 1 - Symptomatic but completely ambulatory  Blood pressure 158/97, pulse 70, temperature 98.1 F (36.7 C), temperature source Oral, resp. rate 18, height '5\' 7"'  (1.702 m), weight 150 lb (68.04 kg).  GENERAL:alert, no distress and comfortable; well  developed and well nourished. Anxious SKIN: skin color, texture, turgor are normal, no rashes or significant lesions EYES: normal, Conjunctiva are pink and non-injected, sclera clear OROPHARYNX:no exudate, no erythema and lips, buccal mucosa, and tongue normal  NECK: supple, thyroid normal size, non-tender, without nodularity LYMPH:  no palpable lymphadenopathy in the cervical, axillary or supraclavicular LUNGS: clear to auscultation with normal breathing effort, no wheezes or rhonchi HEART: regular rate & rhythm and no murmurs and no lower extremity edema; L AVF ABDOMEN:abdomen soft, non-tender and normal bowel sounds Musculoskeletal:no cyanosis of digits and no clubbing  NEURO: alert & oriented x 3 with fluent speech, no focal motor/sensory deficits  Labs:  Lab Results  Component Value Date   WBC 4.2 03/29/2014   HGB 8.2* 03/29/2014   HCT 24.9* 03/29/2014   MCV 92.2 03/29/2014   PLT 103* 03/29/2014   NEUTROABS 2.1 03/29/2014      Chemistry      Component Value Date/Time   NA 129* 03/29/2014 1128   NA 125* 03/18/2014 0500   K 4.8 03/29/2014 1128   K 3.8 03/18/2014 0500   CL 92* 03/18/2014 0500   CO2 25 03/29/2014 1128   CO2 25 03/18/2014 0500   BUN 32.1* 03/29/2014 1128   BUN 31* 03/18/2014 0500   CREATININE 8.3* 03/29/2014 1128   CREATININE 8.22* 03/18/2014 0500      Component Value Date/Time   CALCIUM 8.8 03/29/2014 1128   CALCIUM 8.6 03/18/2014 0500   ALKPHOS  34* 03/24/2014 1355   ALKPHOS 37* 03/11/2014 1658   AST 25 03/24/2014 1355   AST 29 03/11/2014 1658   ALT 11 03/24/2014 1355   ALT 11 03/11/2014 1658   BILITOT 0.46 03/24/2014 1355   BILITOT 0.4 03/11/2014 1658       Basic Metabolic Panel:  Recent Labs Lab 03/24/14 1355 03/29/14 1128  NA 128* 129*  K 4.2 4.8  CO2 29 25  GLUCOSE 119 91  BUN 31.8* 32.1*  CREATININE 8.3 Repeated and Verified* 8.3*  CALCIUM 7.7* 8.8   GFR Estimated Creatinine Clearance: 5.7 ml/min (by C-G formula based on Cr of 8.3). Liver Function  Tests:  Recent Labs Lab 03/24/14 1355  AST 25  ALT 11  ALKPHOS 34*  BILITOT 0.46  PROT 14.7*  ALBUMIN 2.3*   No results found for this basename: LIPASE, AMYLASE,  in the last 168 hours No results found for this basename: AMMONIA,  in the last 168 hours Coagulation profile No results found for this basename: INR, PROTIME,  in the last 168 hours  CBC:  Recent Labs Lab 03/24/14 1355 03/29/14 1128  WBC 3.5* 4.2  NEUTROABS 1.4* 2.1  HGB 6.9* 8.2*  HCT 22.7* 24.9*  MCV 94.6 92.2  PLT 72* 103*    Anemia work up No results found for this basename: VITAMINB12, FOLATE, FERRITIN, TIBC, IRON, RETICCTPCT,  in the last 72 hours  Studies:  No results found.   RADIOGRAPHIC STUDIES: Ct Biopsy  03/17/2014   CLINICAL DATA:  73 year old with history of myeloma and anemia.  EXAM: CT GUIDED BONE MARROW ASPIRATES AND BIOPSY  Physician: Stephan Minister. Henn, MD  MEDICATIONS: 2 mg Versed, 50 mcg fentanyl. A radiology nurse monitored the patient for moderate sedation.  ANESTHESIA/SEDATION: Sedation time: 10 min  PROCEDURE: The procedure was explained to the patient. The risks and benefits of the procedure were discussed and the patient's questions were addressed. Informed consent was obtained from the patient. The patient was placed prone on CT scan. Images of the pelvis were obtained. The right side of back was prepped and draped in sterile fashion. The skin and right posterior iliac bone were anesthetized with 1% lidocaine. 11 gauge bone needle was directed into the right iliac bone with CT guidance. Two aspirates and one core biopsy obtained.  FINDINGS: Needle directed into the right iliac bone. Subtle areas of lucency throughout the pelvic bones. Findings are consistent with history of myeloma.  COMPLICATIONS: None  IMPRESSION: CT guided bone marrow aspirates and core biopsy.   Electronically Signed   By: Markus Daft M.D.   On: 03/17/2014 13:07   Dg Bone Survey Met  03/12/2014   CLINICAL DATA:  Myeloma.   Right knee pain.  EXAM: METASTATIC BONE SURVEY  COMPARISON:  None.  FINDINGS: Multiple images are performed of the axial and appendicular skeleton.  Multiple small lytic lesions are identified within the skull consistent with myeloma. A small lytic lesion is identified within the right scapula.  Degenerative changes are identified within the mid cervical spine. No suspicious lytic or blastic lesions are identified within the spine, or long bones. Surgical clips are identified in the left upper arm. Degenerative changes are seen in the knees bilaterally, right greater than left.  The heart is mildly enlarged. There are areas of atelectasis within the right lung. No focal consolidations or pulmonary edema.  IMPRESSION: 1. Small lytic lesions within the skull and right scapula. 2. Degenerative changes in the spine and knees, right greater than left.  3. Cardiomegaly without pulmonary edema.   Electronically Signed   By: Shon Hale M.D.   On: 03/12/2014 10:13    ASSESSMENT: [name] 73 y.o. female with a history of Multiple myeloma - Plan: CBC with Differential, Basic metabolic panel (Bmet) - [facility], CBC with Differential, Basic metabolic panel (Bmet) - [facility], SPEP & IFE with QIG, Kappa/lambda light chains  Pancytopenia  ESRD (end stage renal disease)   PLAN:   1. IgG Lambda MM, rapidly progressive.  --I reviewed extensively her medical records from [place], [place] and her recent work out. Her Bone marrow is packed with 85% plasma cells likely explaining her refractory anemia and pancytopenia. Her protein is 14.7. Her case has been difficult due to a history of non-compliance. She stated that she stopped her recent treatment in [place] due to being hospitalized. Per records from her primary oncologist's office, it was likely due to her kidney failure due to progression of her multiple myeloma. Her last treatment is as noted above. Considering she received only one cycle of carfilzomib plus dexamethasone, I think  it is prudent to start her back. I discussed with her starting her back on the following regiment based on Nicole Kindred et. Al, NEJM, 2015).   *Carfilzomib (15 mg/m2) on days 1, 3, 8, 10, 15, 17  *Lenalidomide 59m daily (please take follow H/D on Tue/th/Sa).  *Dexamethasone 12 mg weekly  We presented the treatment indications, benefits and side-effects including but not limited to myelosuprresion which can result in life-threatening infections and anemia and low plts, and nausea or vomiting or rashes.  She understood the indications, risks and benefits and chose to proceed. She started therapy on 03/29/2014.  2. Pancytopenia with anemia likely multifactorial.  --Secondary to #1. We will start on aranesp biweekly if not provided with her H/D.  She cannot have transfusions on the days of her dialysis (Tues, Thurs and Sat). Her ANC is 1,400. She was advised extensively on symptoms of anemia and neutropenia.   3. ESRDz.  --Likely secondary to #1. Continue hemodialysis on Tues, Thursday and Sat.   4. Psuedohyponatremia.  --Likely due to #1. Treat underlying disorder.  5. History of Noncompliance.   6. Follow up.  --Patient will follow up in our office in two weeks for symptom visit while on  Carfilzomib, lenalidomide and dexamethasone. .Marland KitchenShe was provided anti-emetics and acyclovir.   All questions were answered. The patient knows to call the clinic with any problems, questions or concerns. We can certainly see the patient much sooner if necessary.  I spent 25 minutes counseling the patient face to face. The total time spent in the appointment was 40 minutes.    Harriett Azar, MD 03/30/2014 5:47 AM

## 2014-03-29 NOTE — Patient Instructions (Signed)
Starting Carfilzomib plus dexathasone and revlimid on 03/29/2014.  Carfilzomib --On Monday and Wednesday (changed from Tuesday due to dialysis) On days 1, 3, 8, 10, 15, 17.   Dexamethasone --On Mondays only.  --Take 12 mg once weekly on Mondays only.   Revlimid. --Take 5 mg daily --On day of dialysis, please take following dialysis.   Carfilzomib injection What is this medicine? CARFILZOMIB (kar FILZ oh mib) is a chemotherapy drug that works by slowing or stopping cancer cell growth. This medicine is used to treat multiple myeloma. This medicine may be used for other purposes; ask your health care Laurie Lovejoy or pharmacist if you have questions. COMMON BRAND NAME(S): KYPROLIS What should I tell my health care Aylen Rambert before I take this medicine? They need to know if you have any of these conditions: -heart disease -irregular heartbeat -liver disease -lung or breathing disease -an unusual or allergic reaction to carfilzomib, or other medicines, foods, dyes, or preservatives -pregnant or trying to get pregnant -breast-feeding How should I use this medicine? This medicine is for injection or infusion into a vein. It is given by a health care professional in a hospital or clinic setting. Talk to your pediatrician regarding the use of this medicine in children. Special care may be needed. Overdosage: If you think you've taken too much of this medicine contact a poison control center or emergency room at once. Overdosage: If you think you have taken too much of this medicine contact a poison control center or emergency room at once. NOTE: This medicine is only for you. Do not share this medicine with others. What if I miss a dose? It is important not to miss your dose. Call your doctor or health care professional if you are unable to keep an appointment. What may interact with this medicine? Interactions are not expected. Give your health care Mellody Masri a list of all the medicines,  herbs, non-prescription drugs, or dietary supplements you use. Also tell them if you smoke, drink alcohol, or use illegal drugs. Some items may interact with your medicine. This list may not describe all possible interactions. Give your health care Almena Hokenson a list of all the medicines, herbs, non-prescription drugs, or dietary supplements you use. Also tell them if you smoke, drink alcohol, or use illegal drugs. Some items may interact with your medicine. What should I watch for while using this medicine? Your condition will be monitored carefully while you are receiving this medicine. Report any side effects. Continue your course of treatment even though you feel ill unless your doctor tells you to stop. Call your doctor or health care professional for advice if you get a fever, chills or sore throat, or other symptoms of a cold or flu. Do not treat yourself. Try to avoid being around people who are sick. Do not become pregnant while taking this medicine. Women should inform their doctor if they wish to become pregnant or think they might be pregnant. There is a potential for serious side effects to an unborn child. Talk to your health care professional or pharmacist for more information. Do not breast-feed an infant while taking this medicine. Check with your doctor or health care professional if you get an attack of severe diarrhea, nausea and vomiting, or if you sweat a lot. The loss of too much body fluid can make it dangerous for you to take this medicine. You may get dizzy. Do not drive, use machinery, or do anything that needs mental alertness until you know how this medicine  affects you. Do not stand or sit up quickly, especially if you are an older patient. This reduces the risk of dizzy or fainting spells. What side effects may I notice from receiving this medicine? Side effects that you should report to your doctor or health care professional as soon as possible: -allergic reactions like skin  rash, itching or hives, swelling of the face, lips, or tongue -breathing problems -chest pain or palpitationschest tightness -cough -dark urine -dizziness -feeling faint or lightheaded -fever or chills -general ill feeling or flu-like symptoms -light-colored stools -palpitations -right upper belly pain -swelling of the legs or ankles -unusual bleeding or bruising -unusually weak or tired -yellowing of the eyes or skin Side effects that usually do not require medical attention (Report these to your doctor or health care professional if they continue or are bothersome.): -diarrhea -headache -nausea, vomiting -tiredness This list may not describe all possible side effects. Call your doctor for medical advice about side effects. You may report side effects to FDA at 1-800-FDA-1088. Where should I keep my medicine? This drug is given in a hospital or clinic and will not be stored at home. NOTE: This sheet is a summary. It may not cover all possible information. If you have questions about this medicine, talk to your doctor, pharmacist, or health care Quinzell Malcomb.  2015, Elsevier/Gold Standard. (2012-03-07 17:02:29)  Dexamethasone tablets What is this medicine? DEXAMETHASONE (dex a METH a sone) is a corticosteroid. It is commonly used to treat inflammation of the skin, joints, lungs, and other organs. Common conditions treated include asthma, allergies, and arthritis. It is also used for other conditions, such as blood disorders and diseases of the adrenal glands. This medicine may be used for other purposes; ask your health care Avyn Coate or pharmacist if you have questions. COMMON BRAND NAME(S): Decadron, DexPak Sterling Big, DexPak TaperPak, Zema-Pak What should I tell my health care Nyomi Howser before I take this medicine? They need to know if you have any of these conditions: -Cushing's syndrome -diabetes -glaucoma -heart problems or disease -high blood pressure -infection like herpes,  measles, tuberculosis, or chickenpox -kidney disease -liver disease -mental problems -myasthenia gravis -osteoporosis -previous heart attack -seizures -stomach, ulcer or intestine disease including colitis and diverticulitis -thyroid problem -an unusual or allergic reaction to dexamethasone, corticosteroids, other medicines, lactose, foods, dyes, or preservatives -pregnant or trying to get pregnant -breast-feeding How should I use this medicine? Take this medicine by mouth with a drink of water. Follow the directions on the prescription label. Take it with food or milk to avoid stomach upset. If you are taking this medicine once a day, take it in the morning. Do not take more medicine than you are told to take. Do not suddenly stop taking your medicine because you may develop a severe reaction. Your doctor will tell you how much medicine to take. If your doctor wants you to stop the medicine, the dose may be slowly lowered over time to avoid any side effects. Talk to your pediatrician regarding the use of this medicine in children. Special care may be needed. Patients over 70 years old may have a stronger reaction and need a smaller dose. Overdosage: If you think you have taken too much of this medicine contact a poison control center or emergency room at once. NOTE: This medicine is only for you. Do not share this medicine with others. What if I miss a dose? If you miss a dose, take it as soon as you can. If it is almost  time for your next dose, talk to your doctor or health care professional. Dennis Bast may need to miss a dose or take an extra dose. Do not take double or extra doses without advice. What may interact with this medicine? Do not take this medicine with any of the following medications: -mifepristone, RU-486 -vaccines This medicine may also interact with the following medications: -amphotericin B -antibiotics like clarithromycin, erythromycin, and troleandomycin -aspirin and  aspirin-like drugs -barbiturates like phenobarbital -carbamazepine -cholestyramine -cholinesterase inhibitors like donepezil, galantamine, rivastigmine, and tacrine -cyclosporine -digoxin -diuretics -ephedrine -female hormones, like estrogens or progestins and birth control pills -indinavir -isoniazid -ketoconazole -medicines for diabetes -medicines that improve muscle tone or strength for conditions like myasthenia gravis -NSAIDs, medicines for pain and inflammation, like ibuprofen or naproxen -phenytoin -rifampin -thalidomide -warfarin This list may not describe all possible interactions. Give your health care  a list of all the medicines, herbs, non-prescription drugs, or dietary supplements you use. Also tell them if you smoke, drink alcohol, or use illegal drugs. Some items may interact with your medicine. What should I watch for while using this medicine? Visit your doctor or health care professional for regular checks on your progress. If you are taking this medicine over a prolonged period, carry an identification card with your name and address, the type and dose of your medicine, and your doctor's name and address. This medicine may increase your risk of getting an infection. Stay away from people who are sick. Tell your doctor or health care professional if you are around anyone with measles or chickenpox. If you are going to have surgery, tell your doctor or health care professional that you have taken this medicine within the last twelve months. Ask your doctor or health care professional about your diet. You may need to lower the amount of salt you eat. The medicine can increase your blood sugar. If you are a diabetic check with your doctor if you need help adjusting the dose of your diabetic medicine. What side effects may I notice from receiving this medicine? Side effects that you should report to your doctor or health care professional as soon as  possible: -allergic reactions like skin rash, itching or hives, swelling of the face, lips, or tongue -changes in vision -fever, sore throat, sneezing, cough, or other signs of infection, wounds that will not heal -increased thirst -mental depression, mood swings, mistaken feelings of self importance or of being mistreated -pain in hips, back, ribs, arms, shoulders, or legs -redness, blistering, peeling or loosening of the skin, including inside the mouth -trouble passing urine or change in the amount of urine -swelling of feet or lower legs -unusual bleeding or bruising Side effects that usually do not require medical attention (report to your doctor or health care professional if they continue or are bothersome): -headache -nausea, vomiting -skin problems, acne, thin and shiny skin -weight gain This list may not describe all possible side effects. Call your doctor for medical advice about side effects. You may report side effects to FDA at 1-800-FDA-1088. Where should I keep my medicine? Keep out of the reach of children. Store at room temperature between 20 and 25 degrees C (68 and 77 degrees F). Protect from light. Throw away any unused medicine after the expiration date. NOTE: This sheet is a summary. It may not cover all possible information. If you have questions about this medicine, talk to your doctor, pharmacist, or health care .  2015, Elsevier/Gold Standard. (2008-01-08 14:02:13)  Lenalidomide Oral Capsules What  is this medicine? LENALIDOMIDE (len a LID oh mide) is used to treat certain types of cancer, including multiple myeloma and mantle cell lymphoma. It is also used to treat some myelodysplastic syndromes that cause severe anemia requiring blood transfusions. This medicine may be used for other purposes; ask your health care  or pharmacist if you have questions. COMMON BRAND NAME(S): Revlimid What should I tell my health care  before I take this  medicine? They need to know if you have any of these conditions: -blood clots in the legs or the lungs -infection -irregular monthly periods or menstrual cycles -kidney disease -liver disease -an unusual or allergic reaction to lenalidomide, other medicines, foods, dyes, or preservatives -pregnant or trying to get pregnant -breast-feeding How should I use this medicine? Take this medicine by mouth with a glass of water. Follow the directions on the prescription label. Do not cut, crush, or chew this medicine. Take your medicine at regular intervals. Do not take it more often than directed. Do not stop taking except on your doctor's advice. A MedGuide will be given with each prescription and refill. Read this guide carefully each time. The MedGuide may change frequently. Talk to your pediatrician regarding the use of this medicine in children. Special care may be needed. Overdosage: If you think you have taken too much of this medicine contact a poison control center or emergency room at once. NOTE: This medicine is only for you. Do not share this medicine with others. What if I miss a dose? If you miss a dose, take it as soon as you can. If your next dose is to be taken in less than 12 hours, then do not take the missed dose. Take the next dose at your regular time. Do not take double or extra doses. What may interact with this medicine? -vaccines This list may not describe all possible interactions. Give your health care  a list of all the medicines, herbs, non-prescription drugs, or dietary supplements you use. Also tell them if you smoke, drink alcohol, or use illegal drugs. Some items may interact with your medicine. What should I watch for while using this medicine? Visit your doctor for regular check ups. Tell your doctor or healthcare professional if your symptoms do not start to get better or if they get worse. You will need to have important blood work done while you are  taking this medicine. This medicine is available only through a special program. Doctors, pharmacies, and patients must meet all of the conditions of the program. Your health care  will help you get signed up with the program if you need this medicine. Through the program you will only receive up to a 28 day supply of the medicine at one time. You will need a new prescription for each refill. This medicine can cause birth defects. Do not get pregnant while taking this drug. Females with child-bearing potential will need to have 2 negative pregnancy tests before starting this medicine. Pregnancy testing must be done every 2 to 4 weeks as directed while taking this medicine. Use 2 reliable forms of birth control together while you are taking this medicine and for 1 month after you stop taking this medicine. If you think that you might be pregnant talk to your doctor right away. Men must use a latex condom during sexual contact with a woman while taking this medicine and for 28 days after you stop taking this medicine. A latex condom is needed even if you have  had a vasectomy. Contact your doctor right away if your partner becomes pregnant. Do not donate sperm while taking this medicine and for 28 days after you stop taking this medicine. Do not give blood while taking the medicine and for 1 month after completion of treatment to avoid exposing pregnant women to the medicine through the donated blood. Talk to your doctor about your risk of cancer. You may be more at risk for certain types of cancers if you take this medicine. You may need blood work done while you are taking this medicine. What side effects may I notice from receiving this medicine? Side effects that you should report to your doctor or health care professional as soon as possible: -allergic reactions like skin rash, itching or hives, swelling of the face, lips, or tongue -breathing problems -chest pain -fever, infection, runny nose,  or sore throat -pain in the legs -right upper belly pain -signs and symptoms of bleeding such as bloody or black, tarry stools; red or dark-brown urine; spitting up blood or brown material that looks like coffee grounds; red spots on the skin; unusual bruising or bleeding from the eye, gums, or nose -swelling or your hands, ankles, or leg -tiredness -yellowing of the eyes or skin Side effects that usually do not require medical attention (report to your doctor or health care professional if they continue or are bothersome): -diarrhea -dizziness -back pain This list may not describe all possible side effects. Call your doctor for medical advice about side effects. You may report side effects to FDA at 1-800-FDA-1088. Where should I keep my medicine? Keep out of the reach of children. Store at room temperature between 15 and 30 degrees C (59 and 86 degrees F). Throw away any unused medicine after the expiration date. NOTE: This sheet is a summary. It may not cover all possible information. If you have questions about this medicine, talk to your doctor, pharmacist, or health care .  2015, Elsevier/Gold Standard. (2013-02-03 16:37:01)

## 2014-03-29 NOTE — Progress Notes (Signed)
Faxed revlimid prescription to [facility]

## 2014-03-29 NOTE — Telephone Encounter (Signed)
Spoke with daughter [name] and instructed to have pt called [facility] to take patient survey for Revlimid .  Jenene Slicker phone number to [facility].  [name] stated she would have pt called today in the afternoon after first chemo.

## 2014-03-29 NOTE — Patient Instructions (Signed)
[name] Discharge Instructions for Patients Receiving Chemotherapy  Today you received the following chemotherapy agents Kyprolis.  To help prevent nausea and vomiting after your treatment, we encourage you to take your nausea medication.   If you develop nausea and vomiting that is not controlled by your nausea medication, call the clinic.   BELOW ARE SYMPTOMS THAT SHOULD BE REPORTED IMMEDIATELY:  *FEVER GREATER THAN 100.5 F  *CHILLS WITH OR WITHOUT FEVER  NAUSEA AND VOMITING THAT IS NOT CONTROLLED WITH YOUR NAUSEA MEDICATION  *UNUSUAL SHORTNESS OF BREATH  *UNUSUAL BRUISING OR BLEEDING  TENDERNESS IN MOUTH AND THROAT WITH OR WITHOUT PRESENCE OF ULCERS  *URINARY PROBLEMS  *BOWEL PROBLEMS  UNUSUAL RASH Items with * indicate a potential emergency and should be followed up as soon as possible.  Feel free to call the clinic you have any questions or concerns. The clinic phone number is [phone].    

## 2014-03-29 NOTE — Telephone Encounter (Signed)
Per staff message and POF I have scheduled appts. Gave patient schedule. JMW

## 2014-03-30 ENCOUNTER — Ambulatory Visit: Payer: Medicare Other

## 2014-03-30 ENCOUNTER — Encounter: Payer: Self-pay | Admitting: Internal Medicine

## 2014-03-30 ENCOUNTER — Telehealth: Payer: Self-pay | Admitting: *Deleted

## 2014-03-30 ENCOUNTER — Encounter: Payer: Self-pay | Admitting: Medical Oncology
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
## 2014-03-30 NOTE — Telephone Encounter (Signed)
Message copied by Cherylynn Ridges on Tue Mar 30, 2014 12:10 PM ------      Message from: Jaci Carrel A      Created: Mon Mar 29, 2014  4:09 PM      Regarding: chemo follow up call       First Kyprolis. Dr Juliann Mule.  Patient has dialysis from 12-3 ------

## 2014-03-30 NOTE — Telephone Encounter (Signed)
RECEIVED A FAX FROM [facility] CONCERNING A CONFIRMATION OF FACSIMILE RECEIPT FOR PT. REFERRAL. 

## 2014-03-31 ENCOUNTER — Telehealth: Payer: Self-pay | Admitting: *Deleted

## 2014-03-31 ENCOUNTER — Other Ambulatory Visit: Payer: Self-pay | Admitting: Internal Medicine

## 2014-03-31 ENCOUNTER — Ambulatory Visit (HOSPITAL_BASED_OUTPATIENT_CLINIC_OR_DEPARTMENT_OTHER): Payer: Medicare Other

## 2014-03-31 VITALS — BP 169/94 | HR 76 | Temp 97.9°F | Resp 18

## 2014-03-31 DIAGNOSIS — Z5112 Encounter for antineoplastic immunotherapy: Secondary | ICD-10-CM

## 2014-03-31 DIAGNOSIS — C9000 Multiple myeloma not having achieved remission: Secondary | ICD-10-CM

## 2014-03-31 MED ORDER — ONDANSETRON 8 MG/50ML IVPB (CHCC)
8.0000 mg | Freq: Once | INTRAVENOUS | Status: AC
  Administered 2014-03-31: 8 mg via INTRAVENOUS

## 2014-03-31 MED ORDER — DEXAMETHASONE SODIUM PHOSPHATE 10 MG/ML IJ SOLN
10.0000 mg | Freq: Once | INTRAMUSCULAR | Status: AC
  Administered 2014-03-31: 10 mg via INTRAVENOUS

## 2014-03-31 MED ORDER — DEXAMETHASONE SODIUM PHOSPHATE 10 MG/ML IJ SOLN
INTRAMUSCULAR | Status: AC
  Filled 2014-03-31: qty 1

## 2014-03-31 MED ORDER — SODIUM CHLORIDE 0.9 % IV SOLN
Freq: Once | INTRAVENOUS | Status: AC
  Administered 2014-03-31: 16:00:00 via INTRAVENOUS

## 2014-03-31 MED ORDER — DEXTROSE 5 % IV SOLN
15.0000 mg/m2 | Freq: Once | INTRAVENOUS | Status: AC
  Administered 2014-03-31: 26 mg via INTRAVENOUS
  Filled 2014-03-31: qty 13

## 2014-03-31 MED ORDER — ONDANSETRON 8 MG/NS 50 ML IVPB
INTRAVENOUS | Status: AC
  Filled 2014-03-31: qty 8

## 2014-03-31 NOTE — Patient Instructions (Signed)
[facility] Discharge Instructions for Patients Receiving Chemotherapy  Today you received the following chemotherapy agents: Kyprolis  To help prevent nausea and vomiting after your treatment, we encourage you to take your nausea medication: as directed.   If you develop nausea and vomiting that is not controlled by your nausea medication, call the clinic.   BELOW ARE SYMPTOMS THAT SHOULD BE REPORTED IMMEDIATELY:  *FEVER GREATER THAN 100.5 F  *CHILLS WITH OR WITHOUT FEVER  NAUSEA AND VOMITING THAT IS NOT CONTROLLED WITH YOUR NAUSEA MEDICATION  *UNUSUAL SHORTNESS OF BREATH  *UNUSUAL BRUISING OR BLEEDING  TENDERNESS IN MOUTH AND THROAT WITH OR WITHOUT PRESENCE OF ULCERS  *URINARY PROBLEMS  *BOWEL PROBLEMS  UNUSUAL RASH Items with * indicate a potential emergency and should be followed up as soon as possible.  Feel free to call the clinic you have any questions or concerns. The clinic phone number is [phone].    

## 2014-03-31 NOTE — Telephone Encounter (Signed)
Per patient request I have moved appts to later time

## 2014-04-01 ENCOUNTER — Encounter: Payer: Self-pay | Admitting: Hematology & Oncology

## 2014-04-05 ENCOUNTER — Other Ambulatory Visit: Payer: Self-pay | Admitting: Internal Medicine

## 2014-04-05 ENCOUNTER — Ambulatory Visit (HOSPITAL_BASED_OUTPATIENT_CLINIC_OR_DEPARTMENT_OTHER): Payer: Medicare Other

## 2014-04-05 ENCOUNTER — Ambulatory Visit: Payer: Medicare Other

## 2014-04-05 VITALS — BP 141/75 | HR 68 | Temp 97.7°F | Resp 18

## 2014-04-05 DIAGNOSIS — C9000 Multiple myeloma not having achieved remission: Secondary | ICD-10-CM

## 2014-04-05 DIAGNOSIS — D649 Anemia, unspecified: Secondary | ICD-10-CM

## 2014-04-05 DIAGNOSIS — D62 Acute posthemorrhagic anemia: Secondary | ICD-10-CM

## 2014-04-05 LAB — COMPREHENSIVE METABOLIC PANEL (CC13)
ALT: 18 U/L (ref 0–55)
AST: 26 U/L (ref 5–34)
Albumin: 2.3 g/dL — ABNORMAL LOW (ref 3.5–5.0)
Alkaline Phosphatase: 38 U/L — ABNORMAL LOW (ref 40–150)
Anion Gap: 4 mEq/L (ref 3–11)
BILIRUBIN TOTAL: 0.65 mg/dL (ref 0.20–1.20)
BUN: 57.8 mg/dL — ABNORMAL HIGH (ref 7.0–26.0)
CALCIUM: 8.7 mg/dL (ref 8.4–10.4)
CHLORIDE: 96 meq/L — AB (ref 98–109)
CO2: 27 mEq/L (ref 22–29)
Creatinine: 8.7 mg/dL (ref 0.6–1.1)
Glucose: 90 mg/dl (ref 70–140)
Potassium: 5.6 mEq/L — ABNORMAL HIGH (ref 3.5–5.1)
Sodium: 127 mEq/L — ABNORMAL LOW (ref 136–145)
Total Protein: 13.5 g/dL — ABNORMAL HIGH (ref 6.4–8.3)

## 2014-04-05 LAB — CBC WITH DIFFERENTIAL/PLATELET
BASO%: 0.3 % (ref 0.0–2.0)
Basophils Absolute: 0 10*3/uL (ref 0.0–0.1)
EOS%: 2.8 % (ref 0.0–7.0)
Eosinophils Absolute: 0.1 10*3/uL (ref 0.0–0.5)
HCT: 21.2 % — ABNORMAL LOW (ref 34.8–46.6)
HGB: 6.5 g/dL — CL (ref 11.6–15.9)
LYMPH%: 35 % (ref 14.0–49.7)
MCH: 28.9 pg (ref 25.1–34.0)
MCHC: 30.7 g/dL — AB (ref 31.5–36.0)
MCV: 94.2 fL (ref 79.5–101.0)
MONO#: 0.4 10*3/uL (ref 0.1–0.9)
MONO%: 9.4 % (ref 0.0–14.0)
NEUT#: 2.1 10*3/uL (ref 1.5–6.5)
NEUT%: 52.5 % (ref 38.4–76.8)
NRBC: 1 % — AB (ref 0–0)
PLATELETS: 94 10*3/uL — AB (ref 145–400)
RBC: 2.25 10*6/uL — AB (ref 3.70–5.45)
RDW: 19.8 % — AB (ref 11.2–14.5)
WBC: 3.9 10*3/uL (ref 3.9–10.3)
lymph#: 1.4 10*3/uL (ref 0.9–3.3)

## 2014-04-05 MED ORDER — SODIUM CHLORIDE 0.9 % IV SOLN
Freq: Once | INTRAVENOUS | Status: AC
  Administered 2014-04-05: 17:00:00 via INTRAVENOUS

## 2014-04-05 NOTE — Patient Instructions (Signed)

## 2014-04-05 NOTE — Progress Notes (Signed)
Dr. Juliann Mule notified of labs from 6/29. Orders received for CBC and Cmet today.  Pt notified that [facility] was available to transport her home. Pt refused and states she will call her brother to come pick her up after treatment.

## 2014-04-05 NOTE — Progress Notes (Signed)
Hold [place] treatment today, per Dr. Juliann Mule; Hgb = 6.5; 500 ml normal saline given to patient per Dr. Juliann Mule; patient scheduled for lab draw and 1 unit of PRBCs tomorrow at 8:00 and 9:00 am; patient verbalized understanding.

## 2014-04-05 NOTE — Telephone Encounter (Signed)
RECEIVED A FAX FROM [facility] CONCERNING A CONFIRMATION OF PRESCRIPTION SHIPMENT FOR REVLIMID ON 04/01/14.

## 2014-04-06 ENCOUNTER — Ambulatory Visit (HOSPITAL_BASED_OUTPATIENT_CLINIC_OR_DEPARTMENT_OTHER): Payer: Medicare Other

## 2014-04-06 ENCOUNTER — Other Ambulatory Visit: Payer: Self-pay

## 2014-04-06 ENCOUNTER — Other Ambulatory Visit: Payer: Medicare Other

## 2014-04-06 ENCOUNTER — Telehealth: Payer: Self-pay

## 2014-04-06 ENCOUNTER — Ambulatory Visit: Payer: Medicare Other

## 2014-04-06 ENCOUNTER — Ambulatory Visit (HOSPITAL_COMMUNITY)
Admission: RE | Admit: 2014-04-06 | Discharge: 2014-04-06 | Disposition: A | Discharge status: Home / Self Care | Payer: Medicare Other | Source: Ambulatory Visit | Attending: Internal Medicine | Admitting: Internal Medicine

## 2014-04-06 VITALS — BP 144/75 | HR 58 | Temp 97.6°F | Resp 18

## 2014-04-06 DIAGNOSIS — D649 Anemia, unspecified: Secondary | ICD-10-CM | POA: Diagnosis present

## 2014-04-06 DIAGNOSIS — D61818 Other pancytopenia: Secondary | ICD-10-CM | POA: Diagnosis not present

## 2014-04-06 DIAGNOSIS — C9000 Multiple myeloma not having achieved remission: Secondary | ICD-10-CM

## 2014-04-06 LAB — PREPARE RBC (CROSSMATCH)

## 2014-04-06 LAB — HOLD TUBE, BLOOD BANK

## 2014-04-06 MED ORDER — SODIUM CHLORIDE 0.9 % IJ SOLN
10.0000 mL | INTRAMUSCULAR | Status: DC | PRN
  Filled 2014-04-06: qty 10

## 2014-04-06 MED ORDER — ACETAMINOPHEN 325 MG PO TABS
650.0000 mg | ORAL_TABLET | Freq: Once | ORAL | Status: AC
  Administered 2014-04-06: 650 mg via ORAL

## 2014-04-06 MED ORDER — ACETAMINOPHEN 325 MG PO TABS
ORAL_TABLET | ORAL | Status: AC
  Filled 2014-04-06: qty 2

## 2014-04-06 MED ORDER — DIPHENHYDRAMINE HCL 25 MG PO CAPS
ORAL_CAPSULE | ORAL | Status: AC
  Filled 2014-04-06: qty 1

## 2014-04-06 MED ORDER — SODIUM CHLORIDE 0.9 % IV SOLN
250.0000 mL | Freq: Once | INTRAVENOUS | Status: AC
  Administered 2014-04-06: 250 mL via INTRAVENOUS

## 2014-04-06 MED ORDER — DIPHENHYDRAMINE HCL 25 MG PO CAPS
25.0000 mg | ORAL_CAPSULE | Freq: Once | ORAL | Status: AC
  Administered 2014-04-06: 25 mg via ORAL

## 2014-04-06 NOTE — Progress Notes (Signed)
Patient scheduled for Kyprolis on 04/07/14 @ 1515; desk nurse, Juliann Pulse to send POF to schedule patient for CBC for tomorrow 04/07/14 before treatment to check Hgb level, and also to contact patient with time for lab appointment for tomorrow (04/07/14).

## 2014-04-06 NOTE — Telephone Encounter (Signed)
S/w renee that we would like the pt to come at 2pm for lab 04/07/14 to see if she needs another unit of blood. [name] spoke back the message.

## 2014-04-06 NOTE — Progress Notes (Signed)
Blood ended at this time, VSS,  Normal saline at 200 ml/hr infusing at this time.

## 2014-04-06 NOTE — Progress Notes (Signed)
1240 pm discharged, ambulatory in no distress to front lobby awaiting transportation to dialysis center.

## 2014-04-06 NOTE — Patient Instructions (Signed)

## 2014-04-06 NOTE — Progress Notes (Signed)
Called blood bank at 9:55 am.  Blood not ready yet.  Blood bank will call when ready.  Patient called her brother at 71 for transportation.  He will be here at 1:30 per patient.  Charge nurse called [facility] to notify them she will be late today.  12:40 pm patient to lobby to wait for brother.

## 2014-04-06 NOTE — Progress Notes (Signed)
Blood infusing at goal rate of 185 ml/hr and tolerating well

## 2014-04-06 NOTE — Progress Notes (Signed)
Blood started at 1019 at 66ml/hr.  Tolerating well.  Rate increased to 185 cc/hr.

## 2014-04-07 ENCOUNTER — Other Ambulatory Visit: Payer: Self-pay | Admitting: Medical Oncology

## 2014-04-07 ENCOUNTER — Other Ambulatory Visit (HOSPITAL_BASED_OUTPATIENT_CLINIC_OR_DEPARTMENT_OTHER): Payer: Medicare Other

## 2014-04-07 ENCOUNTER — Ambulatory Visit (HOSPITAL_BASED_OUTPATIENT_CLINIC_OR_DEPARTMENT_OTHER): Payer: Medicare Other

## 2014-04-07 VITALS — BP 129/70 | HR 64 | Temp 98.5°F | Resp 18

## 2014-04-07 DIAGNOSIS — D649 Anemia, unspecified: Secondary | ICD-10-CM

## 2014-04-07 DIAGNOSIS — C9000 Multiple myeloma not having achieved remission: Secondary | ICD-10-CM

## 2014-04-07 LAB — CBC WITH DIFFERENTIAL/PLATELET
BASO%: 0.2 % (ref 0.0–2.0)
BASOS ABS: 0 10*3/uL (ref 0.0–0.1)
EOS%: 2.6 % (ref 0.0–7.0)
Eosinophils Absolute: 0.1 10*3/uL (ref 0.0–0.5)
HEMATOCRIT: 23.7 % — AB (ref 34.8–46.6)
HEMOGLOBIN: 7.3 g/dL — AB (ref 11.6–15.9)
LYMPH%: 34 % (ref 14.0–49.7)
MCH: 29.1 pg (ref 25.1–34.0)
MCHC: 30.8 g/dL — ABNORMAL LOW (ref 31.5–36.0)
MCV: 94.4 fL (ref 79.5–101.0)
MONO#: 0.4 10*3/uL (ref 0.1–0.9)
MONO%: 9.8 % (ref 0.0–14.0)
NEUT#: 2.2 10*3/uL (ref 1.5–6.5)
NEUT%: 53.4 % (ref 38.4–76.8)
Platelets: 105 10*3/uL — ABNORMAL LOW (ref 145–400)
RBC: 2.51 10*6/uL — ABNORMAL LOW (ref 3.70–5.45)
RDW: 22.1 % — ABNORMAL HIGH (ref 11.2–14.5)
WBC: 4.2 10*3/uL (ref 3.9–10.3)
lymph#: 1.4 10*3/uL (ref 0.9–3.3)
nRBC: 3 % — ABNORMAL HIGH (ref 0–0)

## 2014-04-07 LAB — TECHNOLOGIST REVIEW

## 2014-04-07 MED ORDER — ACETAMINOPHEN 325 MG PO TABS
ORAL_TABLET | ORAL | Status: AC
  Filled 2014-04-07: qty 2

## 2014-04-07 MED ORDER — SODIUM CHLORIDE 0.9 % IV SOLN
250.0000 mL | Freq: Once | INTRAVENOUS | Status: AC
  Administered 2014-04-07: 250 mL via INTRAVENOUS

## 2014-04-07 MED ORDER — DIPHENHYDRAMINE HCL 25 MG PO CAPS
ORAL_CAPSULE | ORAL | Status: AC
  Filled 2014-04-07: qty 1

## 2014-04-07 MED ORDER — ACETAMINOPHEN 325 MG PO TABS
650.0000 mg | ORAL_TABLET | Freq: Once | ORAL | Status: AC
  Administered 2014-04-07: 650 mg via ORAL

## 2014-04-07 MED ORDER — DIPHENHYDRAMINE HCL 25 MG PO CAPS
25.0000 mg | ORAL_CAPSULE | Freq: Once | ORAL | Status: AC
  Administered 2014-04-07: 25 mg via ORAL

## 2014-04-07 MED ORDER — DIPHENHYDRAMINE HCL 25 MG PO CAPS: 25.0000 mg | ORAL_CAPSULE | Freq: Once | ORAL | Status: DC

## 2014-04-07 MED ORDER — ACETAMINOPHEN 325 MG PO TABS: 650.0000 mg | ORAL_TABLET | Freq: Once | ORAL | Status: DC

## 2014-04-07 NOTE — Progress Notes (Signed)
Discussed patient with Dr. Julien Nordmann.  VO given and read back to hold chemo today and give patient one unit of blood today

## 2014-04-07 NOTE — Patient Instructions (Signed)

## 2014-04-08 LAB — TYPE AND SCREEN
ABO/RH(D): O POS
Antibody Screen: NEGATIVE
UNIT DIVISION: 0
Unit division: 0

## 2014-04-12 ENCOUNTER — Telehealth: Payer: Self-pay | Admitting: Internal Medicine

## 2014-04-12 ENCOUNTER — Ambulatory Visit (HOSPITAL_BASED_OUTPATIENT_CLINIC_OR_DEPARTMENT_OTHER): Payer: Medicare Other

## 2014-04-12 ENCOUNTER — Encounter: Payer: Self-pay | Admitting: Internal Medicine

## 2014-04-12 ENCOUNTER — Other Ambulatory Visit: Payer: Self-pay | Admitting: *Deleted

## 2014-04-12 ENCOUNTER — Other Ambulatory Visit (HOSPITAL_BASED_OUTPATIENT_CLINIC_OR_DEPARTMENT_OTHER): Payer: Medicare Other

## 2014-04-12 ENCOUNTER — Ambulatory Visit (HOSPITAL_BASED_OUTPATIENT_CLINIC_OR_DEPARTMENT_OTHER): Payer: Medicare Other | Admitting: Internal Medicine

## 2014-04-12 VITALS — BP 150/74 | HR 62 | Temp 98.4°F | Resp 19 | Ht 67.0 in | Wt 154.7 lb

## 2014-04-12 VITALS — BP 127/65 | HR 57 | Temp 98.3°F | Resp 18

## 2014-04-12 DIAGNOSIS — Z5112 Encounter for antineoplastic immunotherapy: Secondary | ICD-10-CM

## 2014-04-12 DIAGNOSIS — D61818 Other pancytopenia: Secondary | ICD-10-CM

## 2014-04-12 DIAGNOSIS — C9000 Multiple myeloma not having achieved remission: Secondary | ICD-10-CM

## 2014-04-12 DIAGNOSIS — E871 Hypo-osmolality and hyponatremia: Secondary | ICD-10-CM

## 2014-04-12 DIAGNOSIS — D649 Anemia, unspecified: Secondary | ICD-10-CM | POA: Diagnosis not present

## 2014-04-12 DIAGNOSIS — M7989 Other specified soft tissue disorders: Secondary | ICD-10-CM

## 2014-04-12 DIAGNOSIS — K0889 Other specified disorders of teeth and supporting structures: Secondary | ICD-10-CM

## 2014-04-12 DIAGNOSIS — Z992 Dependence on renal dialysis: Secondary | ICD-10-CM

## 2014-04-12 DIAGNOSIS — N186 End stage renal disease: Secondary | ICD-10-CM

## 2014-04-12 LAB — BASIC METABOLIC PANEL (CC13)
Anion Gap: 6 mEq/L (ref 3–11)
BUN: 63.3 mg/dL — AB (ref 7.0–26.0)
CHLORIDE: 97 meq/L — AB (ref 98–109)
CO2: 25 meq/L (ref 22–29)
Calcium: 8.5 mg/dL (ref 8.4–10.4)
Creatinine: 8.3 mg/dL (ref 0.6–1.1)
GLUCOSE: 77 mg/dL (ref 70–140)
Potassium: 4.8 mEq/L (ref 3.5–5.1)
Sodium: 128 mEq/L — ABNORMAL LOW (ref 136–145)

## 2014-04-12 LAB — CBC WITH DIFFERENTIAL/PLATELET
BASO%: 0.2 % (ref 0.0–2.0)
Basophils Absolute: 0 10*3/uL (ref 0.0–0.1)
EOS ABS: 0.1 10*3/uL (ref 0.0–0.5)
EOS%: 2.8 % (ref 0.0–7.0)
HCT: 23.7 % — ABNORMAL LOW (ref 34.8–46.6)
HGB: 7.3 g/dL — ABNORMAL LOW (ref 11.6–15.9)
LYMPH%: 33.9 % (ref 14.0–49.7)
MCH: 29.2 pg (ref 25.1–34.0)
MCHC: 30.8 g/dL — ABNORMAL LOW (ref 31.5–36.0)
MCV: 94.8 fL (ref 79.5–101.0)
MONO#: 0.3 10*3/uL (ref 0.1–0.9)
MONO%: 6.4 % (ref 0.0–14.0)
NEUT%: 56.7 % (ref 38.4–76.8)
NEUTROS ABS: 2.4 10*3/uL (ref 1.5–6.5)
NRBC: 0 % (ref 0–0)
PLATELETS: 126 10*3/uL — AB (ref 145–400)
RBC: 2.5 10*6/uL — ABNORMAL LOW (ref 3.70–5.45)
RDW: 21.3 % — ABNORMAL HIGH (ref 11.2–14.5)
WBC: 4.2 10*3/uL (ref 3.9–10.3)
lymph#: 1.4 10*3/uL (ref 0.9–3.3)

## 2014-04-12 LAB — PREPARE RBC (CROSSMATCH)

## 2014-04-12 LAB — HOLD TUBE, BLOOD BANK

## 2014-04-12 MED ORDER — SODIUM CHLORIDE 0.9 % IV SOLN
Freq: Once | INTRAVENOUS | Status: AC
  Administered 2014-04-12: 10:00:00 via INTRAVENOUS

## 2014-04-12 MED ORDER — ONDANSETRON 8 MG/50ML IVPB (CHCC)
8.0000 mg | Freq: Once | INTRAVENOUS | Status: AC
  Administered 2014-04-12: 8 mg via INTRAVENOUS

## 2014-04-12 MED ORDER — ACETAMINOPHEN 325 MG PO TABS
650.0000 mg | ORAL_TABLET | Freq: Once | ORAL | Status: AC
  Administered 2014-04-12: 650 mg via ORAL

## 2014-04-12 MED ORDER — ACETAMINOPHEN 325 MG PO TABS
ORAL_TABLET | ORAL | Status: AC
  Filled 2014-04-12: qty 2

## 2014-04-12 MED ORDER — DEXAMETHASONE SODIUM PHOSPHATE 10 MG/ML IJ SOLN
INTRAMUSCULAR | Status: AC
  Filled 2014-04-12: qty 1

## 2014-04-12 MED ORDER — DEXTROSE 5 % IV SOLN
15.0000 mg/m2 | Freq: Once | INTRAVENOUS | Status: AC
  Administered 2014-04-12: 26 mg via INTRAVENOUS
  Filled 2014-04-12: qty 13

## 2014-04-12 MED ORDER — ONDANSETRON 8 MG/NS 50 ML IVPB
INTRAVENOUS | Status: AC
  Filled 2014-04-12: qty 8

## 2014-04-12 MED ORDER — FUROSEMIDE 10 MG/ML IJ SOLN
20.0000 mg | Freq: Once | INTRAMUSCULAR | Status: AC
  Administered 2014-04-12: 20 mg via INTRAVENOUS

## 2014-04-12 MED ORDER — DEXAMETHASONE SODIUM PHOSPHATE 10 MG/ML IJ SOLN
10.0000 mg | Freq: Once | INTRAMUSCULAR | Status: AC
  Administered 2014-04-12: 10 mg via INTRAVENOUS

## 2014-04-12 MED ORDER — DIPHENHYDRAMINE HCL 25 MG PO CAPS
ORAL_CAPSULE | ORAL | Status: AC
  Filled 2014-04-12: qty 1

## 2014-04-12 MED ORDER — DIPHENHYDRAMINE HCL 25 MG PO CAPS
25.0000 mg | ORAL_CAPSULE | Freq: Once | ORAL | Status: AC
  Administered 2014-04-12: 25 mg via ORAL

## 2014-04-12 NOTE — Telephone Encounter (Signed)
gv adn printed appt sched and avs for pt for July...sed added tx.Marland KitchenMarland KitchenMarland KitchenMarland Kitchenlm with Lattie Haw in Dr Lawana Chambers office she will contact pt with appt

## 2014-04-12 NOTE — Patient Instructions (Signed)
[facility] Discharge Instructions for Patients Receiving Chemotherapy  Today you received the following chemotherapy agents Kyprolis.  To help prevent nausea and vomiting after your treatment, we encourage you to take your nausea medication as prescribed.   If you develop nausea and vomiting that is not controlled by your nausea medication, call the clinic.   BELOW ARE SYMPTOMS THAT SHOULD BE REPORTED IMMEDIATELY:  *FEVER GREATER THAN 100.5 F  *CHILLS WITH OR WITHOUT FEVER  NAUSEA AND VOMITING THAT IS NOT CONTROLLED WITH YOUR NAUSEA MEDICATION  *UNUSUAL SHORTNESS OF BREATH  *UNUSUAL BRUISING OR BLEEDING  TENDERNESS IN MOUTH AND THROAT WITH OR WITHOUT PRESENCE OF ULCERS  *URINARY PROBLEMS  *BOWEL PROBLEMS  UNUSUAL RASH Items with * indicate a potential emergency and should be followed up as soon as possible.  Feel free to call the clinic you have any questions or concerns. The clinic phone number is [phone].   Blood Transfusion Information WHAT IS A BLOOD TRANSFUSION? A transfusion is the replacement of blood or some of its parts. Blood is made up of multiple cells which provide different functions.  Red blood cells carry oxygen and are used for blood loss replacement.  White blood cells fight against infection.  Platelets control bleeding.  Plasma helps clot blood.  Other blood products are available for specialized needs, such as hemophilia or other clotting disorders. BEFORE THE TRANSFUSION  Who gives blood for transfusions?   You may be able to donate blood to be used at a later date on yourself (autologous donation).  Relatives can be asked to donate blood. This is generally not any safer than if you have received blood from a stranger. The same precautions are taken to ensure safety when a relative's blood is donated.  Healthy volunteers who are fully evaluated to make sure their blood is safe. This is blood bank blood. Transfusion  therapy is the safest it has ever been in the practice of medicine. Before blood is taken from a donor, a complete history is taken to make sure that person has no history of diseases nor engages in risky social behavior (examples are intravenous drug use or sexual activity with multiple partners). The donor's travel history is screened to minimize risk of transmitting infections, such as malaria. The donated blood is tested for signs of infectious diseases, such as HIV and hepatitis. The blood is then tested to be sure it is compatible with you in order to minimize the chance of a transfusion reaction. If you or a relative donates blood, this is often done in anticipation of surgery and is not appropriate for emergency situations. It takes many days to process the donated blood. RISKS AND COMPLICATIONS Although transfusion therapy is very safe and saves many lives, the main dangers of transfusion include:   Getting an infectious disease.  Developing a transfusion reaction. This is an allergic reaction to something in the blood you were given. Every precaution is taken to prevent this. The decision to have a blood transfusion has been considered carefully by your caregiver before blood is given. Blood is not given unless the benefits outweigh the risks. AFTER THE TRANSFUSION  Right after receiving a blood transfusion, you will usually feel much better and more energetic. This is especially true if your red blood cells have gotten low (anemic). The transfusion raises the level of the red blood cells which carry oxygen, and this usually causes an energy increase.  The nurse administering the transfusion will monitor you carefully  for complications. HOME CARE INSTRUCTIONS  No special instructions are needed after a transfusion. You may find your energy is better. Speak with your caregiver about any limitations on activity for underlying diseases you may have. SEEK MEDICAL CARE IF:   Your condition is  not improving after your transfusion.  You develop redness or irritation at the intravenous (IV) site. SEEK IMMEDIATE MEDICAL CARE IF:  Any of the following symptoms occur over the next 12 hours:  Shaking chills.  You have a temperature by mouth above 102 F (38.9 C), not controlled by medicine.  Chest, back, or muscle pain.  People around you feel you are not acting correctly or are confused.  Shortness of breath or difficulty breathing.  Dizziness and fainting.  You get a rash or develop hives.  You have a decrease in urine output.  Your urine turns a dark color or changes to pink, red, or brown. Any of the following symptoms occur over the next 10 days:  You have a temperature by mouth above 102 F (38.9 C), not controlled by medicine.  Shortness of breath.  Weakness after normal activity.  The white part of the eye turns yellow (jaundice).  You have a decrease in the amount of urine or are urinating less often.  Your urine turns a dark color or changes to pink, red, or brown. Document Released: 09/14/2000 Document Revised: 12/10/2011 Document Reviewed: 05/03/2008 [facility] Patient Information 2015 [place], [place]. This information is not intended to replace advice given to you by your health care provider. Make sure you discuss any questions you have with your health care provider.

## 2014-04-12 NOTE — Progress Notes (Signed)
[facility] OFFICE PROGRESS NOTE  No PCP Per Patient No address on file  DIAGNOSIS: Multiple myeloma - Plan: Care order/instruction, 0.9 %  sodium chloride infusion, sodium chloride 0.9 % injection 10 mL, heparin lock flush 100 unit/mL, heparin lock flush 100 unit/mL, sodium chloride 0.9 % injection 3 mL, Ambulatory referral to Dentistry, CBC with Differential, Comprehensive metabolic panel (Cmet) - [facility], CANCELED: Type and screen, CANCELED: Prepare RBC, CANCELED: Transfuse RBC, DISCONTINUED: furosemide (LASIX) injection 20 mg, DISCONTINUED: acetaminophen (TYLENOL) tablet 650 mg, DISCONTINUED: diphenhydrAMINE (BENADRYL) capsule 25 mg  Pancytopenia - Plan: CANCELED: Prepare RBC, CANCELED: Transfuse RBC  ESRD (end stage renal disease)  Pain, dental - Plan: Ambulatory referral to Dentistry  Chief Complaint  Patient presents with  . Multiple Myeloma    CURRENT TREATMENT: Carfilzomib 15 mg/m2 on days 1,3, 8, 10,15, 17 (her dialysis is on day 2); Dexamethasone 12 mg weekly on days 1, 8, 15, 22; revlimid 5 mg daily for 28 days.  Started on 03/29/2014.  She received it on 6/29 and 03/31/2014; Doses on 7/6 and 7/8 were held due to severe anemia.     Multiple myeloma   03/30/2008 Initial Diagnosis Multiple myeloma   03/30/2008 - 05/01/2012 Chemotherapy Revlimid/Dex then Velcade (started on 07/13/2011 ending on 09/14/2011) without response.  Managed by Dr. Chauncey Fischer of [place], [place] [address]   07/24/2010 -  Chemotherapy Received zometa  intermittently over one year.  Creatinine was 1.0 on 07/24/2010. 1.7 on 08/10/2011.    05/01/2012 - 09/01/2012 Chemotherapy Starte Pomalidomide s/p 4 cycles. Referred to [facility] for consideration of ASCT.     10/08/2012 Bone Marrow Biopsy Normocellular marrow with residual/recurrently plasma cell myeloma, 60-70 % of overall marrow celluarity.  Flow: c/w plasma cell neoplasm, plasma cell infiltrate comprises 60-70 % of overall marrow cellularity; FISH:  + for 1 q21/CKS1B gain; RB1 (13q14)   10/08/2012 Bone Marrow Biopsy Continued... Cytogenetics showed normal [name] karyotype.    10/29/2012 Tumor Marker High IgG (6019), high lamda free light chain (409.32) with low IgA, IgM and kappa free light chain.    04/21/2013 Imaging Skeletal survey.  No suspcious lytic or blastic lesions visualized.  Severe multilevel degenerative disc disease in the cervical spine.    04/29/2013 Imaging PeT. No discrete suspicious focus of FDG uptake identified.    05/20/2013 - 06/09/2013 Chemotherapy Started carfilxomib 39 mg on 08/25, 08/26, day 8 (06/02/13), day 9 on 06/03/2013 and on 06/09/2013.     12/03/2013 Treatment Plan Change Last office visit with Dr. Glenna Durand.  She decline further chemotherapy.   Her son passed away and she relocated to [place] to stay with her daughter [name].    03/11/2014 - 03/17/2014 Hospital Admission Admitted for low hemoglobin. Dr. Burney Gauze consulted.  SPEP,  UPEP and Bone marrow biopsy obtained.    IgG 11,300. M-spike 7.52, kappa lamda ratio 0 with lambda free light chains totaling 1260.    03/12/2014 Imaging Skeletal survey. 1. Small lytic lesions within the skull and right scapula. 2. Degenerative changes in the spine and knees, right greater than left. 3. Cardiomegaly without pulmonary edema.   03/17/2014 Bone Marrow Biopsy Hypercellular bone marrow tih extensive involvment by plasma cell neoplasm (Plasma cells 85%). FISH: Presence of 2 copies of the IGH/FGFR3, ATM, CCND1/IGH, and p53 probes.  Loss of 13q34 probes in 40% of cells. gain of chromose 12 and either loss of chorm   03/25/2014 -  Chemotherapy Planning Kyprolis 15 mg/m2 plus dexamethasone weekly plus revlimid (64m) daily on  06/29.     INTERVAL HISTORY: [name] 73 y.o. [name] with a history of IgG Lambda Multiple myeloma complicated by non-compliance, ESRDz on H/D(Tues, Thursday and Sat) is here for follow up. She was initially consulted by [place] on 03/17/14. We recommended  re-staging multiple myeloma markers and skeletal survey (which is as noted below).   Today,she reports feeling much better with improved energy since receiving 1 unit of blood this past week.  She moved from [place] a few months ago once her son passed away and now lives with her daughter. She denies fevers or chills. She had a recent hospitalization (6/11 - 03/17/2014) due to profound anemia. She was transfused packed RBCs doing this admission. Since discharge, she denies fevers or chills or acute shortness of breath.  She complains of swelling in her ankles. She undergoes dialysis every Tuesday, Thursday and Sat.   MEDICAL HISTORY: Past Medical History  Diagnosis Date  . Renal disorder   . ESRD (end stage renal disease)   . Hypertension   . Multiple myeloma 2009  . Thyroid disease   . Hyperparathyroidism   . Thrombocytopenia   . Anemia   . Shortness of breath   . Pneumonia   . GERD (gastroesophageal reflux disease)   . Arthritis     rt knee is stiff    INTERIM HISTORY: has BRBPR (bright red blood per rectum); Acute blood loss anemia; ESRD (end stage renal disease); Multiple myeloma; Pancytopenia; and HTN (hypertension) on her problem list.    ALLERGIES:  has No Known Allergies.  MEDICATIONS: has a current medication list which includes the following prescription(s): acetaminophen-codeine, amlodipine, aspirin ec, cyproheptadine, dexamethasone, hydralazine, isosorbide mononitrate, lenalidomide, lorazepam, metolazone, multiple vitamins-iron, nifedipine, ondansetron, oxybutynin, sevelamer carbonate, acyclovir, dextromethorphan-guaifenesin, and prochlorperazine.  SURGICAL HISTORY:  Past Surgical History  Procedure Laterality Date  . Abdominal hysterectomy      REVIEW OF SYSTEMS:   Constitutional: Denies fevers, chills or abnormal weight loss Eyes: Denies blurriness of vision Ears, nose, mouth, throat, and face: Denies mucositis or sore throat Respiratory: Denies cough, dyspnea  or wheezes Cardiovascular: Denies palpitation, chest discomfort or lower extremity swelling Gastrointestinal:  Denies nausea, heartburn or change in bowel habits Skin: Denies abnormal skin rashes Lymphatics: Denies new lymphadenopathy or easy bruising Neurological:Denies numbness, tingling or new weaknesses Behavioral/Psych: Mood is stable, no new changes  All other systems were reviewed with the patient and are negative.  PHYSICAL EXAMINATION: ECOG PERFORMANCE STATUS: 1 - Symptomatic but completely ambulatory  Blood pressure 150/74, pulse 62, temperature 98.4 F (36.9 C), temperature source Oral, resp. rate 19, height '5\' 7"'  (1.702 m), weight 154 lb 11.2 oz (70.171 kg), SpO2 100.00%.  GENERAL:alert, no distress and comfortable; well developed and well nourished. Anxious SKIN: skin color, texture, turgor are normal, no rashes or significant lesions EYES: normal, Conjunctiva are pink and non-injected, sclera clear OROPHARYNX:no exudate, no erythema and lips, buccal mucosa, and tongue normal  NECK: supple, thyroid normal size, non-tender, without nodularity LYMPH:  no palpable lymphadenopathy in the cervical, axillary or supraclavicular LUNGS: clear to auscultation with normal breathing effort, no wheezes or rhonchi HEART: regular rate & rhythm and no murmurs and 1+ edema in ankles bilaterally; L AVF ABDOMEN:abdomen soft, non-tender and normal bowel sounds Musculoskeletal:no cyanosis of digits and no clubbing  NEURO: alert & oriented x 3 with fluent speech, no focal motor/sensory deficits  Labs:  Lab Results  Component Value Date   WBC 4.2 04/12/2014   HGB 7.3* 04/12/2014   HCT 23.7*  04/12/2014   MCV 94.8 04/12/2014   PLT 126* 04/12/2014   NEUTROABS 2.4 04/12/2014      Chemistry      Component Value Date/Time   NA 128* 04/12/2014 0841   NA 125* 03/18/2014 0500   K 4.8 04/12/2014 0841   K 3.8 03/18/2014 0500   CL 92* 03/18/2014 0500   CO2 25 04/12/2014 0841   CO2 25 03/18/2014 0500    BUN 63.3* 04/12/2014 0841   BUN 31* 03/18/2014 0500   CREATININE 8.3* 04/12/2014 0841   CREATININE 8.22* 03/18/2014 0500      Component Value Date/Time   CALCIUM 8.5 04/12/2014 0841   CALCIUM 8.6 03/18/2014 0500   ALKPHOS 38* 04/05/2014 1551   ALKPHOS 37* 03/11/2014 1658   AST 26 04/05/2014 1551   AST 29 03/11/2014 1658   ALT 18 04/05/2014 1551   ALT 11 03/11/2014 1658   BILITOT 0.65 04/05/2014 1551   BILITOT 0.4 03/11/2014 1658       Basic Metabolic Panel:  Recent Labs Lab 04/05/14 1551 04/12/14 0841  NA 127* 128*  K 5.6* 4.8  CO2 27 25  GLUCOSE 90 77  BUN 57.8* 63.3*  CREATININE 8.7* 8.3*  CALCIUM 8.7 8.5   GFR Estimated Creatinine Clearance: 5.7 ml/min (by C-G formula based on Cr of 8.3). Liver Function Tests:  Recent Labs Lab 04/05/14 1551  AST 26  ALT 18  ALKPHOS 38*  BILITOT 0.65  PROT 13.5*  ALBUMIN 2.3*   No results found for this basename: LIPASE, AMYLASE,  in the last 168 hours No results found for this basename: AMMONIA,  in the last 168 hours Coagulation profile No results found for this basename: INR, PROTIME,  in the last 168 hours  CBC:  Recent Labs Lab 04/05/14 1551 04/07/14 1421 04/12/14 0841  WBC 3.9 4.2 4.2  NEUTROABS 2.1 2.2 2.4  HGB 6.5* 7.3* 7.3*  HCT 21.2* 23.7* 23.7*  MCV 94.2 94.4 94.8  PLT 94* 105* 126*    Anemia work up No results found for this basename: VITAMINB12, FOLATE, FERRITIN, TIBC, IRON, RETICCTPCT,  in the last 72 hours  Studies:  No results found.   RADIOGRAPHIC STUDIES: Ct Biopsy  03/17/2014   CLINICAL DATA:  73 year old with history of myeloma and anemia.  EXAM: CT GUIDED BONE MARROW ASPIRATES AND BIOPSY  Physician: Stephan Minister. Henn, MD  MEDICATIONS: 2 mg Versed, 50 mcg fentanyl. A radiology nurse monitored the patient for moderate sedation.  ANESTHESIA/SEDATION: Sedation time: 10 min  PROCEDURE: The procedure was explained to the patient. The risks and benefits of the procedure were discussed and the patient's questions  were addressed. Informed consent was obtained from the patient. The patient was placed prone on CT scan. Images of the pelvis were obtained. The right side of back was prepped and draped in sterile fashion. The skin and right posterior iliac bone were anesthetized with 1% lidocaine. 11 gauge bone needle was directed into the right iliac bone with CT guidance. Two aspirates and one core biopsy obtained.  FINDINGS: Needle directed into the right iliac bone. Subtle areas of lucency throughout the pelvic bones. Findings are consistent with history of myeloma.  COMPLICATIONS: None  IMPRESSION: CT guided bone marrow aspirates and core biopsy.   Electronically Signed   By: Markus Daft M.D.   On: 03/17/2014 13:07   Dg Bone Survey Met  03/12/2014   CLINICAL DATA:  Myeloma.  Right knee pain.  EXAM: METASTATIC BONE SURVEY  COMPARISON:  None.  FINDINGS: Multiple images are performed of the axial and appendicular skeleton.  Multiple small lytic lesions are identified within the skull consistent with myeloma. A small lytic lesion is identified within the right scapula.  Degenerative changes are identified within the mid cervical spine. No suspicious lytic or blastic lesions are identified within the spine, or long bones. Surgical clips are identified in the left upper arm. Degenerative changes are seen in the knees bilaterally, right greater than left.  The heart is mildly enlarged. There are areas of atelectasis within the right lung. No focal consolidations or pulmonary edema.  IMPRESSION: 1. Small lytic lesions within the skull and right scapula. 2. Degenerative changes in the spine and knees, right greater than left. 3. Cardiomegaly without pulmonary edema.   Electronically Signed   By: Shon Hale M.D.   On: 03/12/2014 10:13    ASSESSMENT: Emyah Roznowski 73 y.o. [name] with a history of Multiple myeloma - Plan: Care order/instruction, 0.9 %  sodium chloride infusion, sodium chloride 0.9 % injection 10 mL, heparin lock  flush 100 unit/mL, heparin lock flush 100 unit/mL, sodium chloride 0.9 % injection 3 mL, Ambulatory referral to Dentistry, CBC with Differential, Comprehensive metabolic panel (Cmet) - [facility], CANCELED: Type and screen, CANCELED: Prepare RBC, CANCELED: Transfuse RBC, DISCONTINUED: furosemide (LASIX) injection 20 mg, DISCONTINUED: acetaminophen (TYLENOL) tablet 650 mg, DISCONTINUED: diphenhydrAMINE (BENADRYL) capsule 25 mg  Pancytopenia - Plan: CANCELED: Prepare RBC, CANCELED: Transfuse RBC  ESRD (end stage renal disease)  Pain, dental - Plan: Ambulatory referral to Dentistry   PLAN:   1. IgG Lambda MM, rapidly progressive.  --On prior visits, I reviewed extensively her medical records from [place], [place] and her recent work out. Her Bone marrow is packed with 85% plasma cells likely explaining her refractory anemia and pancytopenia. Her protein was 14.7 down to 12.5. Her case has been difficult due to a history of non-compliance. She stated that she stopped her recent treatment in [place] due to being hospitalized. Per records from her primary oncologist's office, it was likely due to her kidney failure due to progression of her multiple myeloma. Her last treatment is as noted above. Considering she received only one cycle of carfilzomib plus dexamethasone, I think it is prudent to start her back. I discussed with her starting her back on the following regiment based on Nicole Kindred et. Al, NEJM, 2015).   *Carfilzomib (15 mg/m2) on days 1, 3, 8, 10, 15, 17  *Lenalidomide 33m daily (please take follow H/D on Tue/th/Sa).  *Dexamethasone 12 mg weekly  We presented the treatment indications, benefits and side-effects including but not limited to myelosuprresion which can result in life-threatening infections and anemia and low plts, and nausea or vomiting or rashes.  She understood the indications, risks and benefits and chose to proceed. She started therapy on 03/29/2014. And completed day #3 on 07/01.  Due to  persistent anemia, she did not receive Days 8 (07/06) and day 10 (07/08).   We will resume her treatment on day 15 (today ) and continue on day #17 (on 07/15).    We will give her one unit of packed RBCs with lasix today.   2. Pancytopenia with anemia likely multifactorial.  --Secondary to #1. We will start on aranesp biweekly if not provided with her H/D.  She cannot have transfusions on the days of her dialysis (Tues, Thurs and Sat). Her ANC is 2,400. She was advised extensively on symptoms of anemia. As noted above she will receive one unit of packed RBCs  today.  Her WBC are within normal limits.  Her plts are up to 126 from 105 and 94 (prior to treatment).   3. ESRDz.  --Likely secondary to #1. Continue hemodialysis on Tues, Thursday and Sat.   4. Psuedohyponatremia.  --Likely due to #1. Treat underlying disorder. Her sodium is up to 128 from 125 prior to treatment on 03/18/2014.   5. History of Noncompliance.   6. Lower extremity swelling. --We will give lasix with her receipt of blood transfusion today.  May require additional fluid pulled off with h/d.   7. Follow up.  --Patient will follow up in our office in three weeks for symptom visit while on  Carfilzomib, lenalidomide and dexamethasone.  We will consider starting cycle #2, day 1 of above treatment. She was provided anti-emetics and acyclovir.   All questions were answered. The patient knows to call the clinic with any problems, questions or concerns. We can certainly see the patient much sooner if necessary.  I spent 15 minutes counseling the patient face to face. The total time spent in the appointment was 25 minutes.    Daritza Brees, MD 04/12/2014 12:32 PM

## 2014-04-13 ENCOUNTER — Telehealth (HOSPITAL_COMMUNITY): Payer: Self-pay

## 2014-04-13 ENCOUNTER — Other Ambulatory Visit (HOSPITAL_COMMUNITY): Payer: Medicare Other | Admitting: Dentistry

## 2014-04-13 ENCOUNTER — Ambulatory Visit: Payer: Medicare Other

## 2014-04-13 LAB — TYPE AND SCREEN
ABO/RH(D): O POS
Antibody Screen: NEGATIVE
Unit division: 0

## 2014-04-13 NOTE — Telephone Encounter (Signed)
04/13/14   Broken appt./ Patient's daughter called left msg. cancelling appt. for Dental Consult w/Dr. Enrique Sack on day of appt. 04/13/14 @ 10:15. Patient will call back to reschedule. LRI

## 2014-04-14 ENCOUNTER — Other Ambulatory Visit: Payer: Self-pay | Admitting: Internal Medicine

## 2014-04-14 ENCOUNTER — Telehealth (HOSPITAL_COMMUNITY): Payer: Self-pay | Admitting: Dentistry

## 2014-04-14 ENCOUNTER — Other Ambulatory Visit (HOSPITAL_COMMUNITY): Payer: Medicare Other | Admitting: Dentistry

## 2014-04-14 ENCOUNTER — Ambulatory Visit (HOSPITAL_BASED_OUTPATIENT_CLINIC_OR_DEPARTMENT_OTHER): Payer: Medicare Other

## 2014-04-14 DIAGNOSIS — Z5112 Encounter for antineoplastic immunotherapy: Secondary | ICD-10-CM

## 2014-04-14 DIAGNOSIS — C9000 Multiple myeloma not having achieved remission: Secondary | ICD-10-CM

## 2014-04-14 MED ORDER — ONDANSETRON 8 MG/50ML IVPB (CHCC)
8.0000 mg | Freq: Once | INTRAVENOUS | Status: AC
  Administered 2014-04-14: 8 mg via INTRAVENOUS

## 2014-04-14 MED ORDER — LOPERAMIDE HCL 2 MG PO CAPS
4.0000 mg | ORAL_CAPSULE | Freq: Once | ORAL | Status: AC
  Administered 2014-04-14: 4 mg via ORAL

## 2014-04-14 MED ORDER — DEXAMETHASONE SODIUM PHOSPHATE 10 MG/ML IJ SOLN
INTRAMUSCULAR | Status: AC
  Filled 2014-04-14: qty 1

## 2014-04-14 MED ORDER — SODIUM CHLORIDE 0.9 % IV SOLN
Freq: Once | INTRAVENOUS | Status: AC
  Administered 2014-04-14: 16:00:00 via INTRAVENOUS

## 2014-04-14 MED ORDER — LOPERAMIDE HCL 2 MG PO CAPS
ORAL_CAPSULE | ORAL | Status: AC
  Filled 2014-04-14: qty 2

## 2014-04-14 MED ORDER — DEXAMETHASONE SODIUM PHOSPHATE 10 MG/ML IJ SOLN
10.0000 mg | Freq: Once | INTRAMUSCULAR | Status: AC
  Administered 2014-04-14: 10 mg via INTRAVENOUS

## 2014-04-14 MED ORDER — DEXTROSE 5 % IV SOLN
15.0000 mg/m2 | Freq: Once | INTRAVENOUS | Status: AC
  Administered 2014-04-14: 26 mg via INTRAVENOUS
  Filled 2014-04-14: qty 13

## 2014-04-14 NOTE — Patient Instructions (Signed)
[facility] Discharge Instructions for Patients Receiving Chemotherapy  Today you received the following chemotherapy agents Kyprolis  To help prevent nausea and vomiting after your treatment, we encourage you to take your nausea medication Compazine 10 mg every 6 hours as needed. If you develop nausea and vomiting that is not controlled by your nausea medication, call the clinic.   BELOW ARE SYMPTOMS THAT SHOULD BE REPORTED IMMEDIATELY:  *FEVER GREATER THAN 100.5 F  *CHILLS WITH OR WITHOUT FEVER  NAUSEA AND VOMITING THAT IS NOT CONTROLLED WITH YOUR NAUSEA MEDICATION  *UNUSUAL SHORTNESS OF BREATH  *UNUSUAL BRUISING OR BLEEDING  TENDERNESS IN MOUTH AND THROAT WITH OR WITHOUT PRESENCE OF ULCERS  *URINARY PROBLEMS  *BOWEL PROBLEMS  UNUSUAL RASH Items with * indicate a potential emergency and should be followed up as soon as possible.  Feel free to call the clinic you have any questions or concerns. The clinic phone number is [phone].

## 2014-04-14 NOTE — Telephone Encounter (Signed)
04/14/2014  Patient:            Meadow [place]chuk Date of Birth:  11/03/1940 MRN:                [id number]  2nd Broken appointment for dental consultation. No show No call.  Daughter was contacted at time patient was due for appointment. Daughter indicated that patient refused to come to appointment. Daughter indicated that patient is working to get her Medicaid coverage in [place] and did not want dental appointment now. Patient will call back if she wants a dental appointment. Alternatively, patient may follow up with Dentist of her choice for dental care.  Lenn Cal, DDS  CC: Dr. Juliann Mule

## 2014-04-16 LAB — KAPPA/LAMBDA LIGHT CHAINS
KAPPA LAMBDA RATIO: 0 — AB (ref 0.26–1.65)
Kappa free light chain: 2.41 mg/dL — ABNORMAL HIGH (ref 0.33–1.94)
LAMBDA FREE LGHT CHN: 1420 mg/dL — AB (ref 0.57–2.63)

## 2014-04-16 LAB — SPEP & IFE WITH QIG
ALPHA-1-GLOBULIN: 3.9 % (ref 2.9–4.9)
ALPHA-2-GLOBULIN: 5.2 % — AB (ref 7.1–11.8)
Albumin ELP: 34.4 % — ABNORMAL LOW (ref 55.8–66.1)
BETA 2: 1.3 % — AB (ref 3.2–6.5)
Beta Globulin: 3.4 % — ABNORMAL LOW (ref 4.7–7.2)
Gamma Globulin: 51.8 % — ABNORMAL HIGH (ref 11.1–18.8)
IgA: <6 mg/dL — ABNORMAL LOW (ref 69–380)
IgG (Immunoglobin G), Serum: 7700 mg/dL — ABNORMAL HIGH (ref 690–1700)
IgM, Serum: <5 mg/dL — ABNORMAL LOW (ref 52–322)
M-Spike, %: 5.68 g/dL
TOTAL PROTEIN, SERUM ELECTROPHOR: 11.8 g/dL — AB (ref 6.0–8.3)

## 2014-04-19 ENCOUNTER — Encounter (HOSPITAL_COMMUNITY): Payer: Self-pay | Admitting: Dentistry

## 2014-04-19 ENCOUNTER — Encounter (INDEPENDENT_AMBULATORY_CARE_PROVIDER_SITE_OTHER): Payer: Self-pay

## 2014-04-19 ENCOUNTER — Ambulatory Visit (HOSPITAL_COMMUNITY): Payer: Medicaid - Dental | Admitting: Dentistry

## 2014-04-19 VITALS — BP 154/77 | HR 57 | Temp 98.5°F

## 2014-04-19 DIAGNOSIS — K0889 Other specified disorders of teeth and supporting structures: Secondary | ICD-10-CM

## 2014-04-19 DIAGNOSIS — K045 Chronic apical periodontitis: Secondary | ICD-10-CM

## 2014-04-19 DIAGNOSIS — K029 Dental caries, unspecified: Secondary | ICD-10-CM

## 2014-04-19 DIAGNOSIS — Z972 Presence of dental prosthetic device (complete) (partial): Secondary | ICD-10-CM

## 2014-04-19 DIAGNOSIS — K08109 Complete loss of teeth, unspecified cause, unspecified class: Secondary | ICD-10-CM

## 2014-04-19 DIAGNOSIS — K083 Retained dental root: Secondary | ICD-10-CM

## 2014-04-19 DIAGNOSIS — M87180 Osteonecrosis due to drugs, jaw: Secondary | ICD-10-CM

## 2014-04-19 DIAGNOSIS — C9000 Multiple myeloma not having achieved remission: Secondary | ICD-10-CM

## 2014-04-19 DIAGNOSIS — K0401 Reversible pulpitis: Secondary | ICD-10-CM

## 2014-04-19 DIAGNOSIS — K036 Deposits [accretions] on teeth: Secondary | ICD-10-CM

## 2014-04-19 DIAGNOSIS — T458X5A Adverse effect of other primarily systemic and hematological agents, initial encounter: Secondary | ICD-10-CM

## 2014-04-19 DIAGNOSIS — M2607 Excessive tuberosity of jaw: Secondary | ICD-10-CM

## 2014-04-19 DIAGNOSIS — N186 End stage renal disease: Secondary | ICD-10-CM

## 2014-04-19 DIAGNOSIS — K0530 Chronic periodontitis, unspecified: Secondary | ICD-10-CM

## 2014-04-19 DIAGNOSIS — M270 Developmental disorders of jaws: Secondary | ICD-10-CM

## 2014-04-19 MED ORDER — AMOXICILLIN 500 MG PO CAPS: ORAL_CAPSULE | ORAL | Status: DC

## 2014-04-19 NOTE — Progress Notes (Signed)
DENTAL CONSULTATION  Date of Consultation:  04/19/2014 Patient Name:   [name] Date of Birth:   [birth year] Medical Record Number: [id number]  VITALS: BP 154/77  Pulse 57  Temp(Src) 98.5 F (36.9 C) (Oral)   CHIEF COMPLAINT: Patient referred by Dr. Juliann Mule for evaluation of poor dentition on a patient with a history of IV bisphosphonate therapy.  HPI: [name] is a 73 year old female history of multiple myeloma with history of previous IV bisphosphonate therapy as well as end-stage renal disease with hemodialysis on Tuesday, Thursday, and Saturday. Patient was referred by Dr. Concha Norway for evaluation of poor dentition. Patient does have a history of IV bisphosphonate therapy with risk for osteonecrosis of the jaws.  Patient indicates that she has a history of " abscess" involving the lower left premolar for the past one to two weeks. Patient indicates that it is been throbbing off and on over the past several weeks. Patient indicates that the pain reaches an intensity of 6/10 but is currently 0/10-today. Patient indicates that she did have some swelling associated with the lower left premolar that resolved by itself after she used hot compresses to the area of her cheek.  Patient last saw a dentist in [place] approximately 7-8 years ago. Patient indicates that she had an upper complete and lower cast partial denture fabricated approximately 20+ years ago. The patient indicates that this was " the only set I ever had". Patient indicates that the upper denture fits " okay". Patient indicates that the lower partial denture "flips up and down" when she chews, but she still is able to use the lower partial denture when she needs to. 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  PROBLEM LIST: Patient Active Problem List   Diagnosis Date Noted  . BRBPR (bright red blood per rectum) 03/11/2014  . Acute blood loss anemia 03/11/2014  . ESRD (end  stage renal disease) 03/11/2014  . Multiple myeloma 03/11/2014  . Pancytopenia 03/11/2014  . HTN (hypertension) 03/11/2014    PMH: Past Medical History  Diagnosis Date  . Renal disorder   . ESRD (end stage renal disease)   . Hypertension   . Multiple myeloma 2009  . Thyroid disease   . Hyperparathyroidism   . Thrombocytopenia   . Anemia   . Shortness of breath   . Pneumonia   . GERD (gastroesophageal reflux disease)   . Arthritis     rt knee is stiff    PSH: Past Surgical History  Procedure Laterality Date  . Abdominal hysterectomy      Pt. denies    ALLERGIES: No Known Allergies  MEDICATIONS: Current Outpatient Prescriptions  Medication Sig Dispense Refill  . acyclovir (ZOVIRAX) 400 MG tablet Take 1 tablet (400 mg total) by mouth daily.  60 tablet  1  . amLODipine (NORVASC) 10 MG tablet Take 10 mg by mouth daily.       Marland Kitchen amoxicillin (AMOXIL) 500 MG capsule Take one capsule by mouth every 8 hours until all gone.  30 capsule  0  . aspirin EC 325 MG tablet Take 1 tablet (325 mg total) by mouth daily.  30 tablet  0  . dexamethasone (DECADRON) 4 MG tablet Take 3 tablets (12 mg) on days 1,8,15, and 22 of chemotherapy.  24 tablet  2  . hydrALAZINE (APRESOLINE) 100 MG tablet Take 100 mg by mouth every 8 (eight) hours.      . isosorbide mononitrate (IMDUR) 60 MG  24 hr tablet Take 60 mg by mouth daily.      Marland Kitchen lenalidomide (REVLIMID) 5 MG capsule Take 1 capsule (5 mg total) by mouth daily.  28 capsule  0  . LORazepam (ATIVAN) 0.5 MG tablet Take 1 tablet (0.5 mg total) by mouth every 6 (six) hours as needed (Nausea or vomiting).  30 tablet  0  . Multiple Vitamins-Iron (DAILY-VITE/IRON PO) Take 1 tablet by mouth daily.      Marland Kitchen acetaminophen-codeine (TYLENOL #3) 300-30 MG per tablet Take by mouth every 4 (four) hours as needed for moderate pain.      . cyproheptadine (PERIACTIN) 4 MG tablet Take 4 mg by mouth 2 (two) times daily.      . metolazone (ZAROXOLYN) 5 MG tablet Take 5 mg  by mouth 2 (two) times daily.      Marland Kitchen NIFEdipine (PROCARDIA XL/ADALAT-CC) 90 MG 24 hr tablet Take 90 mg by mouth daily.      . ondansetron (ZOFRAN) 8 MG tablet Take 1 tablet (8 mg total) by mouth 2 (two) times daily. Start the day after chemo for 2 days. Then as needed for nausea or vomiting.  30 tablet  1  . oxybutynin (DITROPAN) 5 MG tablet Take 5 mg by mouth daily.      . prochlorperazine (COMPAZINE) 10 MG tablet Take 1 tablet (10 mg total) by mouth every 6 (six) hours as needed (Nausea or vomiting).  30 tablet  1  . sevelamer carbonate (RENVELA) 800 MG tablet Take 800 mg by mouth 3 (three) times daily with meals.       No current facility-administered medications for this visit.    LABS: Lab Results  Component Value Date   WBC 4.2 04/12/2014   HGB 7.3* 04/12/2014   HCT 23.7* 04/12/2014   MCV 94.8 04/12/2014   PLT 126* 04/12/2014      Component Value Date/Time   NA 128* 04/12/2014 0841   NA 125* 03/18/2014 0500   K 4.8 04/12/2014 0841   K 3.8 03/18/2014 0500   CL 92* 03/18/2014 0500   CO2 25 04/12/2014 0841   CO2 25 03/18/2014 0500   GLUCOSE 77 04/12/2014 0841   GLUCOSE 79 03/18/2014 0500   BUN 63.3* 04/12/2014 0841   BUN 31* 03/18/2014 0500   CREATININE 8.3* 04/12/2014 0841   CREATININE 8.22* 03/18/2014 0500   CALCIUM 8.5 04/12/2014 0841   CALCIUM 8.6 03/18/2014 0500   GFRNONAA 4* 03/18/2014 0500   GFRAA 5* 03/18/2014 0500   Lab Results  Component Value Date   INR 1.39 03/15/2014   No results found for this basename: PTT    SOCIAL HISTORY: History   Social History  . Marital Status: Married    Spouse Name: N/A    Number of Children: 3  . Years of Education: N/A   Occupational History  . Retired      Pharmacist, hospital asst.   Social History Main Topics  . Smoking status: Never Smoker   . Smokeless tobacco: Never Used  . Alcohol Use: No  . Drug Use: No  . Sexual Activity: No   Other Topics Concern  . Not on file   Social History Narrative   Patient recently moved to [place] from [place] in May 2015.   [name] was born in [place], [place].   Patient has brother in [place] area.    FAMILY HISTORY: Family History  Problem Relation Age of Onset  . Hypertension Mother   . Hypertension Father  REVIEW OF SYSTEMS: Reviewed with patient and included in dental record. Patient is a poor historian and does not answer questions directly.  DENTAL HISTORY: CHIEF COMPLAINT: Patient referred by Dr. Juliann Mule for evaluation of poor dentition on a patient with a history of IV bisphosphonate therapy.  HPI: [name] is a 73 year old female history of multiple myeloma with history of previous IV bisphosphonate therapy as well as end-stage renal disease with hemodialysis on Tuesday, Thursday, and Saturday. Patient was referred by Dr. Concha Norway for evaluation of poor dentition. Patient does have a history of IV bisphosphonate therapy with risk for osteonecrosis of the jaws.  Patient indicates that she has a history of " abscess" involving the lower left premolar for the past one to two weeks. Patient indicates that it is been throbbing off and on over the past several weeks. Patient indicates that the pain reaches an intensity of 6/10 but is currently 0/10-today. Patient indicates that she did have some swelling associated with the lower left premolar that resolved by itself after she used hot compresses to the area of her cheek.  Patient last saw a dentist in [place] approximately 7-8 years ago. Patient indicates that she had an upper complete and lower cast partial denture fabricated approximately 20+ years ago. The patient indicates that this was " the only set I ever had". Patient indicates that the upper denture fits " okay". Patient indicates that the lower partial denture "flips up and down" when she chews, but she still is able to use the lower partial denture when she needs to. The patient does have a history of dental phobia related to early childhood  experiences.   DENTAL EXAMINATION: GENERAL: Patient is a well-developed, well-nourished female in no acute distress. HEAD AND NECK: There is no submandibular lymphadenopathy. The patient denies acute TMJ symptoms. INTRAORAL EXAM: Patient has normal saliva. Patient has a mandibular left lingual torus. There is some residual swelling around retained root in the area of #21. No evidence of abscess or purulence is noted. Patient has bilateral maxillary excessive tuberosities. The patient has flabby tissues associated with the premaxilla. DENTITION: Patient is edentulous the exception of retained root segment #21, and tooth #'s 28, and 29. PERIODONTAL: The patient has chronic periodontitis with plaque and calculus accumulations, gingival recession, and tooth mobility. DENTAL CARIES/SUBOPTIMAL RESTORATIONS: There are multiple dental caries associated with tooth numbers 21, 28, 29. ENDODONTIC: There is a history of acute pulpitis symptoms associated with tooth #21. There is evidence of periapical pathology associated with tooth #21. The dental caries associated with tooth numbers 20 and 29 are extensive and close to impingement on the pulp. CROWN AND BRIDGE: There are no crown or bridge restorations. PROSTHODONTIC: The patient has an ill fitting maxillary complete and mandibular partial denture. There are multiple denture teeth missing from the maxillary complete denture. OCCLUSION: Patient has a poor occlusal scheme secondary to multiple missing denture teeth, multiple missing teeth, retained root segment numbers 21, and ill fitting maxillary complete and mandibular partial dentures.  RADIOGRAPHIC INTERPRETATION: An orthopantogram was taken and supplemented with 2 periapical radiographs. There are multiple missing teeth. There is a retained root segment #21. There is periapical pathology and radiolucency associated with the apex of tooth #21. Multiple dental caries are noted. There are bilateral maxillary  excessive tuberosities. There is moderate to severe bone loss.    ASSESSMENTS: 1. Multiple myeloma with history of IV bisphosphonate therapy 2. End-stage renal disease with hemodialysis on Tuesday, Thursday, and Saturday. 3. Risk  for osteonecrosis of the jaw with invasive dental procedures 4. History of acute pulpitis symptoms 5. History of periapical pathology and abscess associated with tooth #21 6. Multiple dental caries 7. Retained root segment #21 8. Chronic periodontitis of bone loss 9. Gingival recession 10. Accretions 11. Tooth mobility  12. Multiple missing teeth 13. Mandibular left lingual torus 14. Ill fitting maxillary complete and mandibular partial dentures 15. Multiple missing denture teeth from the upper complete denture 16. Bilateral maxillary excessive tuberosities 17. Flabby tissues associated with the premaxilla 18 History of dental phobia 19. History of thrombocytopenia with risk for bleeding with invasive dental procedures  PLAN/RECOMMENDATIONS: 1. I discussed the risks, benefits, and complications of various treatment options with the patient in relationship to her medical and dental conditions, previous IV bisphosphonate therapy, and potential risk for osteonecrosis of the jaw with anticipated invasive dental procedures. We discussed various treatment options to include no treatment, total and subtotal extractions with alveoloplasty, pre-prosthetic surgery as indicated, periodontal therapy, dental restorations, root canal therapy, crown and bridge therapy, implant therapy, and replacement of missing teeth as indicated after adequate healing. We discussed referral to an endodontist or oral surgeon due to the complexity of the case. The patient currently wishes to think about her options at this time. Patient was adamant about having the infection "cleared up" before she proceeds with dental treatment. The patient was given a prescription for amoxicillin 500 mg every 8  hours for 10 days. Patient is return to clinic in 1 week for evaluation and discussion of referral for her dental treatment. Prior to dental treatment, the patient will need to have chemotherapy coordinated with Dr. Juliann Mule.   2. Discussion of findings with medical team and coordination of future medical and dental care as needed.  I spent 90 minutes face to face with patient and more than 50% of time was spent in counseling and /or coordination of care.   Lenn Cal, DDS

## 2014-04-19 NOTE — Patient Instructions (Signed)
Patient is to take the amoxicillin 500 mg every 8 hours for the next 10 days. Patient will then need to be referred for evaluation for extractions or root canal therapy once the patient decides on the treatment desired. Patient is scheduled to return to clinic in 1 week to discuss further dental treatment desired at that time. In the meantime, the patient is to follow up with social services to determine if her [place] can be transferred to [facility]  Lenn Cal, DDS

## 2014-04-23 ENCOUNTER — Other Ambulatory Visit: Payer: Self-pay | Admitting: *Deleted

## 2014-04-23 DIAGNOSIS — C9000 Multiple myeloma not having achieved remission: Secondary | ICD-10-CM

## 2014-04-23 NOTE — Telephone Encounter (Signed)
THIS REFILL REQUEST FOR REVLIMID WAS GIVEN TO DR.CHISM'S NURSE, KATHY BUYCK,RN. 

## 2014-04-26 ENCOUNTER — Other Ambulatory Visit (HOSPITAL_BASED_OUTPATIENT_CLINIC_OR_DEPARTMENT_OTHER): Payer: Medicare Other

## 2014-04-26 ENCOUNTER — Ambulatory Visit (HOSPITAL_BASED_OUTPATIENT_CLINIC_OR_DEPARTMENT_OTHER): Payer: Medicare Other

## 2014-04-26 ENCOUNTER — Ambulatory Visit (HOSPITAL_COMMUNITY): Payer: Self-pay | Admitting: Dentistry

## 2014-04-26 ENCOUNTER — Other Ambulatory Visit: Payer: Self-pay | Admitting: Internal Medicine

## 2014-04-26 ENCOUNTER — Encounter: Payer: Self-pay | Admitting: Physician Assistant

## 2014-04-26 ENCOUNTER — Ambulatory Visit (HOSPITAL_BASED_OUTPATIENT_CLINIC_OR_DEPARTMENT_OTHER): Payer: Medicare Other | Admitting: Physician Assistant

## 2014-04-26 ENCOUNTER — Telehealth: Payer: Self-pay | Admitting: Internal Medicine

## 2014-04-26 VITALS — BP 147/78 | HR 55 | Temp 98.6°F | Resp 18 | Ht 67.0 in | Wt 156.4 lb

## 2014-04-26 VITALS — BP 140/71 | HR 53 | Temp 97.0°F | Resp 18

## 2014-04-26 DIAGNOSIS — D61818 Other pancytopenia: Secondary | ICD-10-CM

## 2014-04-26 DIAGNOSIS — D649 Anemia, unspecified: Secondary | ICD-10-CM | POA: Diagnosis not present

## 2014-04-26 DIAGNOSIS — C9000 Multiple myeloma not having achieved remission: Secondary | ICD-10-CM

## 2014-04-26 DIAGNOSIS — M7989 Other specified soft tissue disorders: Secondary | ICD-10-CM | POA: Diagnosis not present

## 2014-04-26 DIAGNOSIS — Z5112 Encounter for antineoplastic immunotherapy: Secondary | ICD-10-CM

## 2014-04-26 DIAGNOSIS — R197 Diarrhea, unspecified: Secondary | ICD-10-CM

## 2014-04-26 DIAGNOSIS — E871 Hypo-osmolality and hyponatremia: Secondary | ICD-10-CM | POA: Diagnosis not present

## 2014-04-26 DIAGNOSIS — N186 End stage renal disease: Secondary | ICD-10-CM

## 2014-04-26 LAB — CBC WITH DIFFERENTIAL/PLATELET
BASO%: 0.4 % (ref 0.0–2.0)
Basophils Absolute: 0 10*3/uL (ref 0.0–0.1)
EOS%: 7 % (ref 0.0–7.0)
Eosinophils Absolute: 0.2 10*3/uL (ref 0.0–0.5)
HEMATOCRIT: 23.5 % — AB (ref 34.8–46.6)
HGB: 7.3 g/dL — ABNORMAL LOW (ref 11.6–15.9)
LYMPH%: 41.1 % (ref 14.0–49.7)
MCH: 30.3 pg (ref 25.1–34.0)
MCHC: 31.1 g/dL — ABNORMAL LOW (ref 31.5–36.0)
MCV: 97.5 fL (ref 79.5–101.0)
MONO#: 0.3 10*3/uL (ref 0.1–0.9)
MONO%: 10 % (ref 0.0–14.0)
NEUT#: 1.1 10*3/uL — ABNORMAL LOW (ref 1.5–6.5)
NEUT%: 41.5 % (ref 38.4–76.8)
Platelets: 111 10*3/uL — ABNORMAL LOW (ref 145–400)
RBC: 2.41 10*6/uL — ABNORMAL LOW (ref 3.70–5.45)
RDW: 22.8 % — AB (ref 11.2–14.5)
WBC: 2.7 10*3/uL — ABNORMAL LOW (ref 3.9–10.3)
lymph#: 1.1 10*3/uL (ref 0.9–3.3)
nRBC: 0 % (ref 0–0)

## 2014-04-26 LAB — COMPREHENSIVE METABOLIC PANEL (CC13)
ALBUMIN: 2.7 g/dL — AB (ref 3.5–5.0)
ALK PHOS: 49 U/L (ref 40–150)
ALT: 16 U/L (ref 0–55)
AST: 26 U/L (ref 5–34)
Anion Gap: 9 mEq/L (ref 3–11)
BUN: 69.2 mg/dL — AB (ref 7.0–26.0)
CO2: 24 mEq/L (ref 22–29)
CREATININE: 8.6 mg/dL — AB (ref 0.6–1.1)
Calcium: 7.9 mg/dL — ABNORMAL LOW (ref 8.4–10.4)
Chloride: 102 mEq/L (ref 98–109)
Glucose: 78 mg/dl (ref 70–140)
POTASSIUM: 5.5 meq/L — AB (ref 3.5–5.1)
Sodium: 134 mEq/L — ABNORMAL LOW (ref 136–145)
Total Bilirubin: 0.67 mg/dL (ref 0.20–1.20)
Total Protein: 10.3 g/dL — ABNORMAL HIGH (ref 6.4–8.3)

## 2014-04-26 LAB — PREPARE RBC (CROSSMATCH)

## 2014-04-26 MED ORDER — FUROSEMIDE 10 MG/ML IJ SOLN
20.0000 mg | Freq: Once | INTRAMUSCULAR | Status: AC
  Administered 2014-04-26: 20 mg via INTRAVENOUS

## 2014-04-26 MED ORDER — ACETAMINOPHEN 325 MG PO TABS
ORAL_TABLET | ORAL | Status: AC
  Filled 2014-04-26: qty 2

## 2014-04-26 MED ORDER — DEXAMETHASONE SODIUM PHOSPHATE 10 MG/ML IJ SOLN
10.0000 mg | Freq: Once | INTRAMUSCULAR | Status: AC
  Administered 2014-04-26: 10 mg via INTRAVENOUS

## 2014-04-26 MED ORDER — SODIUM CHLORIDE 0.9 % IV SOLN: Freq: Once | INTRAVENOUS | Status: DC

## 2014-04-26 MED ORDER — DEXTROSE 5 % IV SOLN
15.0000 mg/m2 | Freq: Once | INTRAVENOUS | Status: AC
  Administered 2014-04-26: 26 mg via INTRAVENOUS
  Filled 2014-04-26: qty 13

## 2014-04-26 MED ORDER — DEXAMETHASONE SODIUM PHOSPHATE 10 MG/ML IJ SOLN
INTRAMUSCULAR | Status: AC
  Filled 2014-04-26: qty 1

## 2014-04-26 MED ORDER — LENALIDOMIDE 2.5 MG PO CAPS: 2.5000 mg | ORAL_CAPSULE | Freq: Every day | ORAL | Status: DC

## 2014-04-26 MED ORDER — SODIUM CHLORIDE 0.9 % IV SOLN
Freq: Once | INTRAVENOUS | Status: AC
  Administered 2014-04-26: 14:00:00 via INTRAVENOUS

## 2014-04-26 MED ORDER — DIPHENHYDRAMINE HCL 25 MG PO CAPS
ORAL_CAPSULE | ORAL | Status: AC
  Filled 2014-04-26: qty 1

## 2014-04-26 MED ORDER — ONDANSETRON 8 MG/NS 50 ML IVPB
INTRAVENOUS | Status: AC
  Filled 2014-04-26: qty 8

## 2014-04-26 MED ORDER — ACETAMINOPHEN 325 MG PO TABS
650.0000 mg | ORAL_TABLET | Freq: Once | ORAL | Status: AC
  Administered 2014-04-26: 650 mg via ORAL

## 2014-04-26 MED ORDER — ONDANSETRON 8 MG/50ML IVPB (CHCC)
8.0000 mg | Freq: Once | INTRAVENOUS | Status: AC
  Administered 2014-04-26: 8 mg via INTRAVENOUS

## 2014-04-26 MED ORDER — DIPHENHYDRAMINE HCL 25 MG PO CAPS
25.0000 mg | ORAL_CAPSULE | Freq: Once | ORAL | Status: AC
  Administered 2014-04-26: 25 mg via ORAL

## 2014-04-26 NOTE — Addendum Note (Signed)
Addended by: Wyonia Hough on: 04/26/2014 04:10 PM   Modules accepted: Orders

## 2014-04-26 NOTE — Progress Notes (Signed)
[facility] OFFICE PROGRESS NOTE  No PCP Per Patient No address on file  DIAGNOSIS: Multiple myeloma - Plan: CBC with Differential, Comprehensive metabolic panel (Cmet) - [facility]  No chief complaint on file.   CURRENT TREATMENT: Carfilzomib 15 mg/m2 on days 1,3, 8, 10,15, 17 (her dialysis is on day 2); Dexamethasone 12 mg weekly on days 1, 8, 15, 22; revlimid 5 mg daily for 28 days.  Started on 03/29/2014.  She received it on 6/29 and 03/31/2014; Doses on 7/6 and 7/8 were held due to severe anemia. She received treatment on 04/12/2014 and 7/152015    Multiple myeloma   03/30/2008 Initial Diagnosis Multiple myeloma   03/30/2008 - 05/01/2012 Chemotherapy Revlimid/Dex then Velcade (started on 07/13/2011 ending on 09/14/2011) without response.  Managed by Dr. Chauncey Fischer of [place], [place] [address]   07/24/2010 -  Chemotherapy Received zometa  intermittently over one year.  Creatinine was 1.0 on 07/24/2010. 1.7 on 08/10/2011.    05/01/2012 - 09/01/2012 Chemotherapy Starte Pomalidomide s/p 4 cycles. Referred to [facility] for consideration of ASCT.     10/08/2012 Bone Marrow Biopsy Normocellular marrow with residual/recurrently plasma cell myeloma, 60-70 % of overall marrow celluarity.  Flow: c/w plasma cell neoplasm, plasma cell infiltrate comprises 60-70 % of overall marrow cellularity; FISH: + for 1 q21/CKS1B gain; RB1 (13q14)   10/08/2012 Bone Marrow Biopsy Continued... Cytogenetics showed normal female karyotype.    10/29/2012 Tumor Marker High IgG (6019), high lamda free light chain (409.32) with low IgA, IgM and kappa free light chain.    04/21/2013 Imaging Skeletal survey.  No suspcious lytic or blastic lesions visualized.  Severe multilevel degenerative disc disease in the cervical spine.    04/29/2013 Imaging PeT. No discrete suspicious focus of FDG uptake identified.    05/20/2013 - 06/09/2013 Chemotherapy Started carfilxomib 39 mg on 08/25, 08/26, day 8 (06/02/13), day 9 on 06/03/2013 and  on 06/09/2013.     12/03/2013 Treatment Plan Change Last office visit with Dr. Glenna Durand.  She decline further chemotherapy.   [name]    03/11/2014 - 03/17/2014 Hospital Admission Admitted for low hemoglobin. Dr. Burney Gauze consulted.  SPEP,  UPEP and Bone marrow biopsy obtained.    IgG 11,300. M-spike 7.52, kappa lamda ratio 0 with lambda free light chains totaling 1260.    03/12/2014 Imaging Skeletal survey. 1. Small lytic lesions within the skull and right scapula. 2. Degenerative changes in the spine and knees, right greater than left. 3. Cardiomegaly without pulmonary edema.   03/17/2014 Bone Marrow Biopsy Hypercellular bone marrow tih extensive involvment by plasma cell neoplasm (Plasma cells 85%). FISH: Presence of 2 copies of the IGH/FGFR3, ATM, CCND1/IGH, and p53 probes.  Loss of 13q34 probes in 40% of cells. gain of chromose 12 and either loss of chorm   03/25/2014 -  Chemotherapy Planning Kyprolis 15 mg/m2 plus dexamethasone weekly plus revlimid (75m) daily on 06/29.     INTERVAL HISTORY: BAnaleise Mccleery89y.o. female with a history of IgG Lambda Multiple myeloma complicated by non-compliance, ESRDz on H/D(Tues, Thursday and Sat) is here for follow up. She was initially consulted by [place] 03[place]17/14. Dr. CJuliann Mulerecommended re-staging multiple myeloma markers and skeletal survey (which is as noted below).   Today,she plans of diarrhea. She states that she'll have diarrhea for approximately 2 days then she'll take Imodium which will stop the diarrhea and then a couple of days later  it will happen all over 10. The diarrhea is present in regardless of the types of foods that she is eating. It seems to have coincided when she started taking the Revlimid 5 mg by mouth daily. She continues to undergo hemodialysis on Tuesdays Thursdays and Saturdays. She voiced no other specific complaints today. She denies fevers or chills. She had  a recent hospitalization (6/11 - 03/17/2014) due to profound anemia. She was transfused packed RBCs during this admission.  She denies fevers or chills or acute shortness of breath.  She complains of continued swelling in her ankles.   MEDICAL HISTORY: Past Medical History  Diagnosis Date  . Renal disorder   . ESRD (end stage renal disease)   . Hypertension   . Multiple myeloma 2009  . Thyroid disease   . Hyperparathyroidism   . Thrombocytopenia   . Anemia   . Shortness of breath   . Pneumonia   . GERD (gastroesophageal reflux disease)   . Arthritis     rt knee is stiff    INTERIM HISTORY: has BRBPR (bright red blood per rectum); Acute blood loss anemia; ESRD (end stage renal disease); Multiple myeloma; Pancytopenia; and HTN (hypertension) on her problem list.    ALLERGIES:  has No Known Allergies.  MEDICATIONS: has a current medication list which includes the following prescription(s): acetaminophen-codeine, acyclovir, amlodipine, amoxicillin, aspirin ec, cyproheptadine, dexamethasone, hydralazine, isosorbide mononitrate, lorazepam, metolazone, multiple vitamins-iron, nifedipine, ondansetron, oxybutynin, prochlorperazine, sevelamer carbonate, and lenalidomide.  SURGICAL HISTORY:  Past Surgical History  Procedure Laterality Date  . Abdominal hysterectomy      Pt. denies    REVIEW OF SYSTEMS:   Constitutional: Denies fevers, chills or abnormal weight loss Eyes: Denies blurriness of vision Ears, nose, mouth, throat, and face: Denies mucositis or sore throat Respiratory: Denies cough, dyspnea or wheezes Cardiovascular: Denies palpitation, chest discomfort or lower extremity swelling Gastrointestinal:  Denies nausea, heartburn or change in bowel habits Skin: Denies abnormal skin rashes Lymphatics: Denies new lymphadenopathy or easy bruising Neurological:Denies numbness, tingling or new weaknesses Behavioral/Psych: Mood is stable, no new changes  All other systems were reviewed  with the patient and are negative.  PHYSICAL EXAMINATION: ECOG PERFORMANCE STATUS: 1 - Symptomatic but completely ambulatory  Blood pressure 147/78, pulse 55, temperature 98.6 F (37 C), temperature source Oral, resp. rate 18, height '5\' 7"'  (1.702 m), weight 156 lb 6.4 oz (70.943 kg).  GENERAL:alert, no distress and comfortable; well developed and well nourished. Anxious SKIN: skin color, texture, turgor are normal, no rashes or significant lesions EYES: normal, Conjunctiva are pink and non-injected, sclera clear OROPHARYNX:no exudate, no erythema and lips, buccal mucosa, and tongue normal  NECK: supple, thyroid normal size, non-tender, without nodularity LYMPH:  no palpable lymphadenopathy in the cervical, axillary or supraclavicular LUNGS: clear to auscultation with normal breathing effort, no wheezes or rhonchi HEART: regular rate & rhythm and no murmurs and 1+ edema in ankles bilaterally; L AVF ABDOMEN:abdomen soft, non-tender and normal bowel sounds Musculoskeletal:no cyanosis of digits and no clubbing. NEURO: alert & oriented x 3 with fluent speech, no focal motor/sensory deficits EXTREMITIES: Reveal 2+ pitting edema primarily about the foot and ankle area  Labs:  Lab Results  Component Value Date   WBC 2.7* 04/26/2014   HGB 7.3* 04/26/2014   HCT 23.5* 04/26/2014   MCV 97.5 04/26/2014   PLT 111* 04/26/2014   NEUTROABS 1.1* 04/26/2014      Chemistry      Component Value Date/Time   NA 134* 04/26/2014 1224  NA 125* 03/18/2014 0500   K 5.5* 04/26/2014 1224   K 3.8 03/18/2014 0500   CL 92* 03/18/2014 0500   CO2 24 04/26/2014 1224   CO2 25 03/18/2014 0500   BUN 69.2* 04/26/2014 1224   BUN 31* 03/18/2014 0500   CREATININE 8.6* 04/26/2014 1224   CREATININE 8.22* 03/18/2014 0500      Component Value Date/Time   CALCIUM 7.9* 04/26/2014 1224   CALCIUM 8.6 03/18/2014 0500   ALKPHOS 49 04/26/2014 1224   ALKPHOS 37* 03/11/2014 1658   AST 26 04/26/2014 1224   AST 29 03/11/2014 1658   ALT 16  04/26/2014 1224   ALT 11 03/11/2014 1658   BILITOT 0.67 04/26/2014 1224   BILITOT 0.4 03/11/2014 1658       Basic Metabolic Panel:  Recent Labs Lab 04/26/14 1224  NA 134*  K 5.5*  CO2 24  GLUCOSE 78  BUN 69.2*  CREATININE 8.6*  CALCIUM 7.9*   GFR Estimated Creatinine Clearance: 5.5 ml/min (by C-G formula based on Cr of 8.6). Liver Function Tests:  Recent Labs Lab 04/26/14 1224  AST 26  ALT 16  ALKPHOS 49  BILITOT 0.67  PROT 10.3*  ALBUMIN 2.7*   No results found for this basename: LIPASE, AMYLASE,  in the last 168 hours No results found for this basename: AMMONIA,  in the last 168 hours Coagulation profile No results found for this basename: INR, PROTIME,  in the last 168 hours  CBC:  Recent Labs Lab 04/26/14 1224  WBC 2.7*  NEUTROABS 1.1*  HGB 7.3*  HCT 23.5*  MCV 97.5  PLT 111*    Anemia work up No results found for this basename: VITAMINB12, FOLATE, FERRITIN, TIBC, IRON, RETICCTPCT,  in the last 72 hours  Studies:  No results found.   RADIOGRAPHIC STUDIES: Ct Biopsy  03/17/2014   CLINICAL DATA:  73 year old with history of myeloma and anemia.  EXAM: CT GUIDED BONE MARROW ASPIRATES AND BIOPSY  Physician: Stephan Minister. Henn, MD  MEDICATIONS: 2 mg Versed, 50 mcg fentanyl. A radiology nurse monitored the patient for moderate sedation.  ANESTHESIA/SEDATION: Sedation time: 10 min  PROCEDURE: The procedure was explained to the patient. The risks and benefits of the procedure were discussed and the patient's questions were addressed. Informed consent was obtained from the patient. The patient was placed prone on CT scan. Images of the pelvis were obtained. The right side of back was prepped and draped in sterile fashion. The skin and right posterior iliac bone were anesthetized with 1% lidocaine. 11 gauge bone needle was directed into the right iliac bone with CT guidance. Two aspirates and one core biopsy obtained.  FINDINGS: Needle directed into the right iliac bone.  Subtle areas of lucency throughout the pelvic bones. Findings are consistent with history of myeloma.  COMPLICATIONS: None  IMPRESSION: CT guided bone marrow aspirates and core biopsy.   Electronically Signed   By: Markus Daft M.D.   On: 03/17/2014 13:07   Dg Bone Survey Met  03/12/2014   CLINICAL DATA:  Myeloma.  Right knee pain.  EXAM: METASTATIC BONE SURVEY  COMPARISON:  None.  FINDINGS: Multiple images are performed of the axial and appendicular skeleton.  Multiple small lytic lesions are identified within the skull consistent with myeloma. A small lytic lesion is identified within the right scapula.  Degenerative changes are identified within the mid cervical spine. No suspicious lytic or blastic lesions are identified within the spine, or long bones. Surgical clips are identified in  the left upper arm. Degenerative changes are seen in the knees bilaterally, right greater than left.  The heart is mildly enlarged. There are areas of atelectasis within the right lung. No focal consolidations or pulmonary edema.  IMPRESSION: 1. Small lytic lesions within the skull and right scapula. 2. Degenerative changes in the spine and knees, right greater than left. 3. Cardiomegaly without pulmonary edema.   Electronically Signed   By: Shon Hale M.D.   On: 03/12/2014 10:13    ASSESSMENT: Rashaunda Rahl 73 y.o. female with a history of Multiple myeloma - Plan: CBC with Differential, Comprehensive metabolic panel (Cmet) - [facility]   PLAN:   1. IgG Lambda MM, rapidly progressive.  --On prior visits, I reviewed extensively her medical records from [place], [place] and her recent work out. Her Bone marrow is packed with 85% plasma cells likely explaining her refractory anemia and pancytopenia. Her protein was 14.7 down to 12.5. Her case has been difficult due to a history of non-compliance. She stated that she stopped her recent treatment in [place] due to being hospitalized. Per records from her primary oncologist's office, it was  likely due to her kidney failure due to progression of her multiple myeloma. Her last treatment is as noted above. Considering she received only one cycle of carfilzomib plus dexamethasone, Dr. Juliann Mule felt it was prudent to start her back. She was started back on the following regiment based on Nicole Kindred et. Al, NEJM, 2015).   *Carfilzomib (15 mg/m2) on days 1, 3, 8, 10, 15, 17  *Lenalidomide 46m daily (please take follow H/D on Tue/th/Sa).  *Dexamethasone 12 mg weekly  Patient was reviewed with Dr. CJuliann Mule His hemoglobin of 7.3 and we will proceed with one unit of packed red blood cells to deal with this level of anemia. Patient is in agreement with proceeding with her transfusion. She'll receive Lasix in addition to the unit of packed red blood cells. She will proceed with treatment today as scheduled.  2. Pancytopenia with anemia likely multifactorial.  --Secondary to #1. We will continue aranesp biweekly if not provided with her H/D.  She cannot have transfusions on the days of her dialysis (Tues, Thurs and Sat). Her ANC is 1,100. She was advised extensively on symptoms of anemia. As noted above she will receive one unit of packed RBCs today.  Her WBC are within normal limits.  Her plts are 111..   3. ESRDz.  --Likely secondary to #1. Continue hemodialysis on Tues, Thursday and Sat.   4. Psuedohyponatremia.  --Likely due to #1. Treat underlying disorder. Her sodium is up to 128 from 125 prior to treatment on 03/18/2014.   5. History of Noncompliance.   6. Lower extremity swelling. --We will give lasix with her receipt of blood transfusion today.  May require additional fluid pulled off with h/d.   7. Diarrhea --Is likely related to the Revlimid. We've asked Ms. Iser to hold her Revlimid for now. We may consider resuming the Revlimid at 2.5 mg per day in the future.  7. Follow up.  --Patient will follow up in our office in two weeks for symptom visit while on  Carfilzomib, lenalidomide and  dexamethasone.  As stated above she will hold her Revlimid until further notice. She is to continue her acyclovir as prescribed and utilize her anti-emetics as needed. She was instructed on the use of Imodium and voiced understanding. She'll proceed with treatment today as scheduled.   All questions were answered. The patient knows to call the  clinic with any problems, questions or concerns. We can certainly see the patient much sooner if necessary.  I spent 25 minutes counseling the patient face to face. The total time spent in the appointment was 35 minutes.    Carlton Adam, PA-C 04/26/2014 4:45 PM

## 2014-04-26 NOTE — Progress Notes (Signed)
OK to treat despite labs WBC 2.7 ANC 1.1 Hgb 7.3 per Awilda Metro, Pa.  Pt will be receiving 1 unit of blood today also.

## 2014-04-26 NOTE — Telephone Encounter (Signed)
gv appt schedule for july/aug to [place] she will give to pt in inf.

## 2014-04-26 NOTE — Progress Notes (Signed)
Ok to treat with ANC 1.1 per Awilda Metro, PA.  No pre hydration with kyprolis per Awilda Metro, PA.  Ok to treat with creatinine 8.6 per Dr. Juliann Mule.

## 2014-04-27 LAB — TYPE AND SCREEN
ABO/RH(D): O POS
Antibody Screen: NEGATIVE
Unit division: 0

## 2014-04-28 ENCOUNTER — Ambulatory Visit: Payer: Medicare Other

## 2014-04-28 ENCOUNTER — Other Ambulatory Visit: Payer: Self-pay | Admitting: *Deleted

## 2014-04-28 ENCOUNTER — Ambulatory Visit (HOSPITAL_BASED_OUTPATIENT_CLINIC_OR_DEPARTMENT_OTHER): Payer: Medicare Other

## 2014-04-28 ENCOUNTER — Ambulatory Visit (HOSPITAL_COMMUNITY): Payer: Medicaid - Dental | Admitting: Dentistry

## 2014-04-28 ENCOUNTER — Encounter (HOSPITAL_COMMUNITY): Payer: Self-pay | Admitting: Dentistry

## 2014-04-28 ENCOUNTER — Encounter (INDEPENDENT_AMBULATORY_CARE_PROVIDER_SITE_OTHER): Payer: Self-pay

## 2014-04-28 VITALS — BP 131/56 | HR 60 | Temp 98.3°F

## 2014-04-28 DIAGNOSIS — N186 End stage renal disease: Secondary | ICD-10-CM

## 2014-04-28 DIAGNOSIS — M270 Developmental disorders of jaws: Secondary | ICD-10-CM

## 2014-04-28 DIAGNOSIS — Z5112 Encounter for antineoplastic immunotherapy: Secondary | ICD-10-CM

## 2014-04-28 DIAGNOSIS — D61818 Other pancytopenia: Secondary | ICD-10-CM

## 2014-04-28 DIAGNOSIS — K083 Retained dental root: Secondary | ICD-10-CM

## 2014-04-28 DIAGNOSIS — Z972 Presence of dental prosthetic device (complete) (partial): Secondary | ICD-10-CM

## 2014-04-28 DIAGNOSIS — K029 Dental caries, unspecified: Secondary | ICD-10-CM

## 2014-04-28 DIAGNOSIS — C9000 Multiple myeloma not having achieved remission: Secondary | ICD-10-CM

## 2014-04-28 DIAGNOSIS — K045 Chronic apical periodontitis: Secondary | ICD-10-CM

## 2014-04-28 DIAGNOSIS — K0889 Other specified disorders of teeth and supporting structures: Secondary | ICD-10-CM

## 2014-04-28 MED ORDER — SODIUM CHLORIDE 0.9 % IV SOLN
Freq: Once | INTRAVENOUS | Status: AC
  Administered 2014-04-28: 13:00:00 via INTRAVENOUS

## 2014-04-28 MED ORDER — DEXAMETHASONE SODIUM PHOSPHATE 10 MG/ML IJ SOLN
10.0000 mg | Freq: Once | INTRAMUSCULAR | Status: AC
  Administered 2014-04-28: 10 mg via INTRAVENOUS

## 2014-04-28 MED ORDER — DEXAMETHASONE SODIUM PHOSPHATE 10 MG/ML IJ SOLN
INTRAMUSCULAR | Status: AC
  Filled 2014-04-28: qty 1

## 2014-04-28 MED ORDER — CARFILZOMIB CHEMO INJECTION 60 MG
15.0000 mg/m2 | Freq: Once | INTRAVENOUS | Status: AC
  Administered 2014-04-28: 26 mg via INTRAVENOUS
  Filled 2014-04-28: qty 13

## 2014-04-28 MED ORDER — ONDANSETRON 8 MG/50ML IVPB (CHCC)
8.0000 mg | Freq: Once | INTRAVENOUS | Status: AC
  Administered 2014-04-28: 8 mg via INTRAVENOUS

## 2014-04-28 MED ORDER — ONDANSETRON 8 MG/NS 50 ML IVPB
INTRAVENOUS | Status: AC
  Filled 2014-04-28: qty 8

## 2014-04-28 NOTE — Patient Instructions (Signed)
Patient is to think about her options and call dental medicine with her decision. In the meantime, the patient is to continue taking her amoxicillin antibiotic therapy until all gone.  Lenn Cal, DDS

## 2014-04-28 NOTE — Patient Instructions (Signed)
[facility] Discharge Instructions for Patients Receiving Chemotherapy  Today you received the following chemotherapy agents: Kyprolis  To help prevent nausea and vomiting after your treatment, we encourage you to take your nausea medication: as directed.   If you develop nausea and vomiting that is not controlled by your nausea medication, call the clinic.   BELOW ARE SYMPTOMS THAT SHOULD BE REPORTED IMMEDIATELY:  *FEVER GREATER THAN 100.5 F  *CHILLS WITH OR WITHOUT FEVER  NAUSEA AND VOMITING THAT IS NOT CONTROLLED WITH YOUR NAUSEA MEDICATION  *UNUSUAL SHORTNESS OF BREATH  *UNUSUAL BRUISING OR BLEEDING  TENDERNESS IN MOUTH AND THROAT WITH OR WITHOUT PRESENCE OF ULCERS  *URINARY PROBLEMS  *BOWEL PROBLEMS  UNUSUAL RASH Items with * indicate a potential emergency and should be followed up as soon as possible.  Feel free to call the clinic you have any questions or concerns. The clinic phone number is [phone].    

## 2014-04-28 NOTE — Progress Notes (Signed)
04/28/2014  Patient:            [name] Date of Birth:  [birth year] MRN:                [id number]  BP 131/56  Pulse 60  Temp(Src) 98.3 F (36.8 C) (Oral)   [name] is a 73 year old patient with multiple myeloma and end-stage renal disease. Patient was seen on 04/19/2014 evaluation of poor dentition and history of lower left gum swelling. Patient was placed on amoxicillin 500 mg every 8 hours for 10 days and was to consider treatment options and referral to an endodontist for root canal therapy or referral to an oral surgeon for dental extractions. Patient now presents for reevaluation of left quadrant swelling and further discussion of treatment options.  Lab Results  Component Value Date   WBC 2.7* 04/26/2014   HGB 7.3* 04/26/2014   HCT 23.5* 04/26/2014   MCV 97.5 04/26/2014   PLT 111* 04/26/2014   BMET    Component Value Date/Time   NA 134* 04/26/2014 1224   NA 125* 03/18/2014 0500   K 5.5* 04/26/2014 1224   K 3.8 03/18/2014 0500   CL 92* 03/18/2014 0500   CO2 24 04/26/2014 1224   CO2 25 03/18/2014 0500   GLUCOSE 78 04/26/2014 1224   GLUCOSE 79 03/18/2014 0500   BUN 69.2* 04/26/2014 1224   BUN 31* 03/18/2014 0500   CREATININE 8.6* 04/26/2014 1224   CREATININE 8.22* 03/18/2014 0500   CALCIUM 7.9* 04/26/2014 1224   CALCIUM 8.6 03/18/2014 0500   GFRNONAA 4* 03/18/2014 0500   GFRAA 5* 03/18/2014 0500    SUBJECTIVE: Patient indicates that she has no toothache symptoms. Patient indicates that the lower left quadrant swelling has resolved significantly. Patient still has an additional 3-4 days of amoxicillin antibiotic therapy to complete. Patient has been receiving active chemotherapy for her multiple myeloma under the care of Dr. Juliann Mule. Patient scheduled to see Dr. Concha Norway later today. Patient indicates that she went to social services to attempt to convert her [place] Medicaid to [facility]. Patient was informed that this may take up to 45-60 days to  complete.  OBJECTIVE: The patient's temperature is normal. The gum swelling in the area of retained root #21 has resolved dramatically. This area is nontender to palpation today. Patient has dental caries as per previous charting. The patient has a mandibular left lingual torus. The upper complete and lower cast partial denture are ill fitting.  ASSESSMENT: 1. 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 2. Multiple dental caries 3. Ill fitting maxillary complete and mandibular partial dentures 4. Mandibular left lingual torus 5. Current pancytopenia secondary to chemotherapy 6. Risk for osteonecrosis of the jaws with invasive dental procedures secondary to previous Zometa/IV bisphosphonate therapy.  PLAN: 1. I again discussed the risks, benefits, and complications of various treatment options for the patient.  We discussed referral to an endodontist for root canal therapy of #21 and/or referral to an oral surgeon for extraction of tooth numbers 21, 28, and 29 with alveoloplasty and pre-prosthetic surgery as indicated. The patient is still not sure of the treatment plan she wishes to proceed with.  Patient will discuss treatment options with Dr. Concha Norway so that future dental treatment can be coordinated with the chemotherapy. The patient is to contact dental medicine with her decision concerning referral as indicated. The patient also is to contact dental medicine if [facility]  Medicaid is obtained. Patient refused a prescription for chlorhexidine rinse that I recommended at this time.  2. Discussion of findings and plan of care with Dr. Shanon Brow Chism to assist in coordination of future dental procedures with the current chemotherapy.   Lenn Cal, DDS

## 2014-05-03 ENCOUNTER — Telehealth: Payer: Self-pay | Admitting: Physician Assistant

## 2014-05-03 ENCOUNTER — Ambulatory Visit: Payer: Medicare Other

## 2014-05-03 ENCOUNTER — Encounter: Payer: Self-pay | Admitting: Physician Assistant

## 2014-05-03 ENCOUNTER — Ambulatory Visit (HOSPITAL_BASED_OUTPATIENT_CLINIC_OR_DEPARTMENT_OTHER): Payer: Medicare Other | Admitting: Physician Assistant

## 2014-05-03 ENCOUNTER — Other Ambulatory Visit (HOSPITAL_BASED_OUTPATIENT_CLINIC_OR_DEPARTMENT_OTHER): Payer: Medicare Other

## 2014-05-03 VITALS — BP 153/74 | HR 62 | Temp 99.6°F | Resp 18 | Ht 67.0 in | Wt 146.5 lb

## 2014-05-03 DIAGNOSIS — R609 Edema, unspecified: Secondary | ICD-10-CM

## 2014-05-03 DIAGNOSIS — E871 Hypo-osmolality and hyponatremia: Secondary | ICD-10-CM | POA: Diagnosis not present

## 2014-05-03 DIAGNOSIS — M7989 Other specified soft tissue disorders: Secondary | ICD-10-CM

## 2014-05-03 DIAGNOSIS — C9000 Multiple myeloma not having achieved remission: Secondary | ICD-10-CM

## 2014-05-03 DIAGNOSIS — N186 End stage renal disease: Secondary | ICD-10-CM | POA: Diagnosis not present

## 2014-05-03 DIAGNOSIS — D61818 Other pancytopenia: Secondary | ICD-10-CM

## 2014-05-03 DIAGNOSIS — R197 Diarrhea, unspecified: Secondary | ICD-10-CM | POA: Diagnosis not present

## 2014-05-03 LAB — CBC WITH DIFFERENTIAL/PLATELET
BASO%: 0.2 % (ref 0.0–2.0)
Basophils Absolute: 0 10*3/uL (ref 0.0–0.1)
EOS%: 0.9 % (ref 0.0–7.0)
Eosinophils Absolute: 0 10*3/uL (ref 0.0–0.5)
HEMATOCRIT: 30.5 % — AB (ref 34.8–46.6)
HEMOGLOBIN: 9.5 g/dL — AB (ref 11.6–15.9)
LYMPH#: 1.3 10*3/uL (ref 0.9–3.3)
LYMPH%: 27.3 % (ref 14.0–49.7)
MCH: 30.7 pg (ref 25.1–34.0)
MCHC: 31.1 g/dL — AB (ref 31.5–36.0)
MCV: 98.7 fL (ref 79.5–101.0)
MONO#: 0.4 10*3/uL (ref 0.1–0.9)
MONO%: 9.5 % (ref 0.0–14.0)
NEUT#: 2.9 10*3/uL (ref 1.5–6.5)
NEUT%: 62.1 % (ref 38.4–76.8)
PLATELETS: 135 10*3/uL — AB (ref 145–400)
RBC: 3.09 10*6/uL — ABNORMAL LOW (ref 3.70–5.45)
RDW: 21.7 % — ABNORMAL HIGH (ref 11.2–14.5)
WBC: 4.7 10*3/uL (ref 3.9–10.3)

## 2014-05-03 MED ORDER — FUROSEMIDE 20 MG PO TABS: 20.0000 mg | ORAL_TABLET | Freq: Every day | ORAL | Status: DC

## 2014-05-03 MED ORDER — HYDROCODONE-HOMATROPINE 5-1.5 MG/5ML PO SYRP: 5.0000 mL | ORAL_SOLUTION | Freq: Four times a day (QID) | ORAL | Status: DC | PRN

## 2014-05-03 NOTE — Progress Notes (Signed)
[place] OFFICE PROGRESS NOTE  No PCP Per Patient No address on file  DIAGNOSIS: Multiple myeloma - Plan: CBC with Differential, Comprehensive metabolic panel (Cmet) - [facility]  Edema - Plan: furosemide (LASIX) 20 MG tablet  No chief complaint on file.   CURRENT TREATMENT: Carfilzomib 15 mg/m2 on days 1,3, 8, 10,15, 17 (her dialysis is on day 2); Dexamethasone 12 mg weekly on days 1, 8, 15, 22; revlimid 5 mg daily for 28 days.  Started on 03/29/2014.  She received it on 6/29 and 03/31/2014; Doses on 7/6 and 7/8 were held due to severe anemia. She received treatment on 04/12/2014 and 7/152015    Multiple myeloma   03/30/2008 Initial Diagnosis Multiple myeloma   03/30/2008 - 05/01/2012 Chemotherapy Revlimid/Dex then Velcade (started on 07/13/2011 ending on 09/14/2011) without response.  Managed by Dr. Chauncey Fischer of [place], [place] [address]   07/24/2010 -  Chemotherapy Received zometa  intermittently over one year.  Creatinine was 1.0 on 07/24/2010. 1.7 on 08/10/2011.    05/01/2012 - 09/01/2012 Chemotherapy Starte Pomalidomide s/p 4 cycles. Referred to [facility] for consideration of ASCT.     10/08/2012 Bone Marrow Biopsy Normocellular marrow with residual/recurrently plasma cell myeloma, 60-70 % of overall marrow celluarity.  Flow: c/w plasma cell neoplasm, plasma cell infiltrate comprises 60-70 % of overall marrow cellularity; FISH: + for 1 q21/CKS1B gain; RB1 (13q14)   10/08/2012 Bone Marrow Biopsy Continued... Cytogenetics showed normal female karyotype.    10/29/2012 Tumor Marker High IgG (6019), high lamda free light chain (409.32) with low IgA, IgM and kappa free light chain.    04/21/2013 Imaging Skeletal survey.  No suspcious lytic or blastic lesions visualized.  Severe multilevel degenerative disc disease in the cervical spine.    04/29/2013 Imaging PeT. No discrete suspicious focus of FDG uptake identified.    05/20/2013 - 06/09/2013 Chemotherapy Started carfilxomib 39 mg on  08/25, 08/26, day 8 (06/02/13), day 9 on 06/03/2013 and on 06/09/2013.     12/03/2013 Treatment Plan Change Last office visit with Dr. Glenna Durand.  She decline further chemotherapy.   [name]    03/11/2014 - 03/17/2014 Hospital Admission Admitted for low hemoglobin. Dr. Burney Gauze consulted.  SPEP,  UPEP and Bone marrow biopsy obtained.    IgG 11,300. M-spike 7.52, kappa lamda ratio 0 with lambda free light chains totaling 1260.    03/12/2014 Imaging Skeletal survey. 1. Small lytic lesions within the skull and right scapula. 2. Degenerative changes in the spine and knees, right greater than left. 3. Cardiomegaly without pulmonary edema.   03/17/2014 Bone Marrow Biopsy Hypercellular bone marrow tih extensive involvment by plasma cell neoplasm (Plasma cells 85%). FISH: Presence of 2 copies of the IGH/FGFR3, ATM, CCND1/IGH, and p53 probes.  Loss of 13q34 probes in 40% of cells. gain of chromose 12 and either loss of chorm   03/25/2014 -  Chemotherapy Planning Kyprolis 15 mg/m2 plus dexamethasone weekly plus revlimid (75m) daily on 06/29.     INTERVAL HISTORY: BAchaia Garlock89y.o. female with a history of IgG Lambda Multiple myeloma complicated by non-compliance, ESRDz on H/D(Tues, Thursday and Sat) is here for follow up. She was initially consulted by [place] 03/17/14. Dr. CJuliann Mulerecommended re-staging multiple myeloma markers and skeletal survey (which is as noted below).   Today, she complains of cold symptoms that she believes she contracted on Saturday at the dialysis center. She states that she  was seated next to a gentleman who had a cold and cough several times without covering his mouth. She feels that she has had some achiness low-grade fevers well as persistent cough. She requests a prescription strength cough syrup and she was up all night for the past 2 nights coughing. She is also interested in some masks that she can wear to  the dialysis center to protect yourself from further exposure. She is also concerned about continued lower extremity edema and is wondering if the Lasix pills would be a more effective than the periodic doses of IV Lasix that she is been receiving. She remains off of Revlimid per our instructions. She has brought her remaining see you 5 mg Revlimid tablets for her pharmacy destroyed. She does report to me that she is received her new prescription of 2.5 mg Revlimid tablets and will not begin this medication until instructed to do so.  She continues to undergo hemodialysis on Tuesdays Thursdays and Saturdays. She voiced no other specific complaints today. Her by mouth intake has been decreased and her weight is down approximately 10 pounds. Is not clear whether all of his weight loss is related to her decreased food intake versus some loss of her edema. She denies fevers or chills or acute shortness of breath.  She complains of continued swelling in her ankles.   MEDICAL HISTORY: Past Medical History  Diagnosis Date  . Renal disorder   . ESRD (end stage renal disease)   . Hypertension   . Multiple myeloma 2009  . Thyroid disease   . Hyperparathyroidism   . Thrombocytopenia   . Anemia   . Shortness of breath   . Pneumonia   . GERD (gastroesophageal reflux disease)   . Arthritis     rt knee is stiff    INTERIM HISTORY: has BRBPR (bright red blood per rectum); Acute blood loss anemia; ESRD (end stage renal disease); Multiple myeloma; Pancytopenia; and HTN (hypertension) on her problem list.    ALLERGIES:  has No Known Allergies.  MEDICATIONS: has a current medication list which includes the following prescription(s): acyclovir, amlodipine, cyproheptadine, dexamethasone, hydralazine, isosorbide mononitrate, lenalidomide, lorazepam, metolazone, multiple vitamins-iron, nifedipine, ondansetron, oxybutynin, prochlorperazine, sevelamer carbonate, acetaminophen-codeine, amoxicillin, aspirin ec,  furosemide, and hydrocodone-homatropine.  SURGICAL HISTORY:  Past Surgical History  Procedure Laterality Date  . Abdominal hysterectomy      Pt. denies    REVIEW OF SYSTEMS:   Constitutional: Denies fevers, chills or abnormal weight loss Eyes: Denies blurriness of vision Ears, nose, mouth, throat, and face: Denies mucositis or sore throat Respiratory: Denies cough, dyspnea or wheezes Cardiovascular: Denies palpitation, chest discomfort or lower extremity swelling Gastrointestinal:  Denies nausea, heartburn or change in bowel habits Skin: Denies abnormal skin rashes Lymphatics: Denies new lymphadenopathy or easy bruising Neurological:Denies numbness, tingling or new weaknesses Behavioral/Psych: Mood is stable, no new changes  All other systems were reviewed with the patient and are negative.  PHYSICAL EXAMINATION: ECOG PERFORMANCE STATUS: 1 - Symptomatic but completely ambulatory  Blood pressure 153/74, pulse 62, temperature 99.6 F (37.6 C), temperature source Oral, resp. rate 18, height _0  (1.702 m), weight 146 lb 8 oz (66.452 kg), SpO2 98.00%.  GENERAL:alert, no distress and comfortable; well developed and well nourished. Anxious with frequent coughing SKIN: skin color, texture, turgor are normal, no rashes or significant lesions EYES: normal, Conjunctiva are pink and non-injected, sclera clear OROPHARYNX:no exudate, no erythema and lips, buccal mucosa, and tongue normal  NECK: supple, thyroid normal size, non-tender, without  nodularity LYMPH:  no palpable lymphadenopathy in the cervical, axillary or supraclavicular LUNGS: clear to auscultation with normal breathing effort, no wheezes or rhonchi HEART: regular rate & rhythm and no murmurs and 1+ to 2+ edema in ankles bilaterally; L AVF ABDOMEN:abdomen soft, non-tender and normal bowel sounds Musculoskeletal:no cyanosis of digits and no clubbing. NEURO: alert & oriented x 3 with fluent speech, no focal motor/sensory  deficits EXTREMITIES: Reveal 2+ pitting edema primarily about the foot and ankle area  Labs:  Lab Results  Component Value Date   WBC 4.7 05/03/2014   HGB 9.5* 05/03/2014   HCT 30.5* 05/03/2014   MCV 98.7 05/03/2014   PLT 135* 05/03/2014   NEUTROABS 2.9 05/03/2014      Chemistry      Component Value Date/Time   NA 134* 04/26/2014 1224   NA 125* 03/18/2014 0500   K 5.5* 04/26/2014 1224   K 3.8 03/18/2014 0500   CL 92* 03/18/2014 0500   CO2 24 04/26/2014 1224   CO2 25 03/18/2014 0500   BUN 69.2* 04/26/2014 1224   BUN 31* 03/18/2014 0500   CREATININE 8.6* 04/26/2014 1224   CREATININE 8.22* 03/18/2014 0500      Component Value Date/Time   CALCIUM 7.9* 04/26/2014 1224   CALCIUM 8.6 03/18/2014 0500   ALKPHOS 49 04/26/2014 1224   ALKPHOS 37* 03/11/2014 1658   AST 26 04/26/2014 1224   AST 29 03/11/2014 1658   ALT 16 04/26/2014 1224   ALT 11 03/11/2014 1658   BILITOT 0.67 04/26/2014 1224   BILITOT 0.4 03/11/2014 1658       Basic Metabolic Panel: No results found for this basename: NA, K, CL, CO2, GLUCOSE, BUN, CREATININE, CALCIUM, MG, PHOS,  in the last 168 hours GFR Estimated Creatinine Clearance: 5.5 ml/min (by C-G formula based on Cr of 8.6). Liver Function Tests: No results found for this basename: AST, ALT, ALKPHOS, BILITOT, PROT, ALBUMIN,  in the last 168 hours No results found for this basename: LIPASE, AMYLASE,  in the last 168 hours No results found for this basename: AMMONIA,  in the last 168 hours Coagulation profile No results found for this basename: INR, PROTIME,  in the last 168 hours  CBC:  Recent Labs Lab 05/03/14 1312  WBC 4.7  NEUTROABS 2.9  HGB 9.5*  HCT 30.5*  MCV 98.7  PLT 135*    Anemia work up No results found for this basename: VITAMINB12, FOLATE, FERRITIN, TIBC, IRON, RETICCTPCT,  in the last 72 hours  Studies:  No results found.   RADIOGRAPHIC STUDIES: Ct Biopsy  03/17/2014   CLINICAL DATA:  73 year old with history of myeloma and anemia.  EXAM: CT GUIDED  BONE MARROW ASPIRATES AND BIOPSY  Physician: Stephan Minister. Henn, MD  MEDICATIONS: 2 mg Versed, 50 mcg fentanyl. A radiology nurse monitored the patient for moderate sedation.  ANESTHESIA/SEDATION: Sedation time: 10 min  PROCEDURE: The procedure was explained to the patient. The risks and benefits of the procedure were discussed and the patient's questions were addressed. Informed consent was obtained from the patient. The patient was placed prone on CT scan. Images of the pelvis were obtained. The right side of back was prepped and draped in sterile fashion. The skin and right posterior iliac bone were anesthetized with 1% lidocaine. 11 gauge bone needle was directed into the right iliac bone with CT guidance. Two aspirates and one core biopsy obtained.  FINDINGS: Needle directed into the right iliac bone. Subtle areas of lucency throughout the pelvic bones.  Findings are consistent with history of myeloma.  COMPLICATIONS: None  IMPRESSION: CT guided bone marrow aspirates and core biopsy.   Electronically Signed   By: Markus Daft M.D.   On: 03/17/2014 13:07   Dg Bone Survey Met  03/12/2014   CLINICAL DATA:  Myeloma.  Right knee pain.  EXAM: METASTATIC BONE SURVEY  COMPARISON:  None.  FINDINGS: Multiple images are performed of the axial and appendicular skeleton.  Multiple small lytic lesions are identified within the skull consistent with myeloma. A small lytic lesion is identified within the right scapula.  Degenerative changes are identified within the mid cervical spine. No suspicious lytic or blastic lesions are identified within the spine, or long bones. Surgical clips are identified in the left upper arm. Degenerative changes are seen in the knees bilaterally, right greater than left.  The heart is mildly enlarged. There are areas of atelectasis within the right lung. No focal consolidations or pulmonary edema.  IMPRESSION: 1. Small lytic lesions within the skull and right scapula. 2. Degenerative changes in the  spine and knees, right greater than left. 3. Cardiomegaly without pulmonary edema.   Electronically Signed   By: Shon Hale M.D.   On: 03/12/2014 10:13    ASSESSMENT: [name] 73 y.o. female with a history of Multiple myeloma - Plan: CBC with Differential, Comprehensive metabolic panel (Cmet) - [facility]  Edema - Plan: furosemide (LASIX) 20 MG tablet   PLAN:   1. IgG Lambda MM, rapidly progressive.  --On prior visits, Dr. Juliann Mule reviewed extensively her medical records from [place], [place] and her recent work out. Her Bone marrow is packed with 85% plasma cells likely explaining her refractory anemia and pancytopenia. Her protein was 14.7 down to 12.5. Her case has been difficult due to a history of non-compliance. She stated that she stopped her recent treatment in [place] due to being hospitalized. Per records from her primary oncologist's office, it was likely due to her kidney failure due to progression of her multiple myeloma. Her last treatment is as noted above. Considering she received only one cycle of carfilzomib plus dexamethasone, Dr. Juliann Mule felt it was prudent to start her back. She was started back on the following regiment based on Nicole Kindred et. Al, NEJM, 2015).   *Carfilzomib (15 mg/m2) on days 1, 3, 8, 10, 15, 17  *Lenalidomide 4m daily (please take follow H/D on Tue/th/Sa).  *Dexamethasone 12 mg weekly  Patient was reviewed with Dr. SAlen Blew Ms. BRosanderis really not feeling well with her upper respiratory illness. Given her symptoms we will not proceed with treatment today but will have her return on Wednesday to proceed with day 10 of cycle #2. She is given a prescription for a small amount of Hycodan cough syrup to be taken primarily at night to help her rest. I also sent a short supply of Lasix 20 mg tablets (a total of 20 tablets (with no refill to her pharmacy of record via E. scribed. I'll discuss further by mouth diuretics with Dr. CJuliann Mulewhen he returns to the office. Ms. BMcilvaineis in  agreement with this plan.   2. Pancytopenia with anemia likely multifactorial.  --Secondary to #1. We will continue aranesp biweekly if not provided with her H/D.  She cannot have transfusions on the days of her dialysis (Tues, Thurs and Sat). Her ANC is 1,100. She was advised extensively on symptoms of anemia.    3. ESRDz.  --Likely secondary to #1. Continue hemodialysis on Tues, Thursday and Sat.  4. Psuedohyponatremia.  --Likely due to #1. Treat underlying disorder. Her sodium is now up to 134 from 128 on 04/12/2014.   5. History of Noncompliance.   6. Lower extremity swelling. --We will give lasix with her receipt of blood transfusion today.  May require additional fluid pulled off with h/d. She is given Lasix tablets 20 mg a total of twenty tablets. This was reviewed with Dr. Alen Blew and will be discussed further with Dr. Judithann Sheen on his return.  7. Diarrhea --Is likely related to the Revlimid. We've asked Ms. Rua to hold her Revlimid for now. We may consider resuming the Revlimid at 2.5 mg per day in the future.  7. Follow up.  --Patient will follow up in our office in one week for symptom visit while on  Carfilzomib, lenalidomide and dexamethasone.  As stated above she will hold her Revlimid until further notice. She is to continue her acyclovir as prescribed and utilize her anti-emetics as needed. Today's treatment will be held due to her upper respiratory illness and she will resume treatment on Wednesday as scheduled.    All questions were answered. The patient knows to call the clinic with any problems, questions or concerns. We can certainly see the patient much sooner if necessary.  I spent 25 minutes counseling the patient face to face. The total time spent in the appointment was 35 minutes.    Carlton Adam, PA-C 05/03/2014 4:59 PM

## 2014-05-03 NOTE — Telephone Encounter (Signed)
Pt confirmed labs/ov per 08/03 POF, gave pt AVS...KJ °

## 2014-05-05 ENCOUNTER — Other Ambulatory Visit: Payer: Self-pay | Admitting: Hematology and Oncology

## 2014-05-05 ENCOUNTER — Ambulatory Visit (HOSPITAL_BASED_OUTPATIENT_CLINIC_OR_DEPARTMENT_OTHER): Payer: Medicare Other

## 2014-05-05 VITALS — BP 125/81 | HR 62 | Temp 98.5°F

## 2014-05-05 DIAGNOSIS — Z5112 Encounter for antineoplastic immunotherapy: Secondary | ICD-10-CM

## 2014-05-05 DIAGNOSIS — C9000 Multiple myeloma not having achieved remission: Secondary | ICD-10-CM

## 2014-05-05 MED ORDER — DEXAMETHASONE SODIUM PHOSPHATE 10 MG/ML IJ SOLN
10.0000 mg | Freq: Once | INTRAMUSCULAR | Status: AC
  Administered 2014-05-05: 10 mg via INTRAVENOUS

## 2014-05-05 MED ORDER — DEXAMETHASONE SODIUM PHOSPHATE 10 MG/ML IJ SOLN
INTRAMUSCULAR | Status: AC
  Filled 2014-05-05: qty 1

## 2014-05-05 MED ORDER — DEXTROSE 5 % IV SOLN
15.0000 mg/m2 | Freq: Once | INTRAVENOUS | Status: AC
  Administered 2014-05-05: 26 mg via INTRAVENOUS
  Filled 2014-05-05: qty 13

## 2014-05-05 MED ORDER — ONDANSETRON 8 MG/NS 50 ML IVPB
INTRAVENOUS | Status: AC
  Filled 2014-05-05: qty 8

## 2014-05-05 MED ORDER — SODIUM CHLORIDE 0.9 % IV SOLN
Freq: Once | INTRAVENOUS | Status: AC
  Administered 2014-05-05: 16:00:00 via INTRAVENOUS

## 2014-05-05 MED ORDER — ONDANSETRON 8 MG/50ML IVPB (CHCC)
8.0000 mg | Freq: Once | INTRAVENOUS | Status: AC
  Administered 2014-05-05: 8 mg via INTRAVENOUS

## 2014-05-05 NOTE — Patient Instructions (Signed)
[facility] Discharge Instructions for Patients Receiving Chemotherapy  Today you received the following chemotherapy agents kyprolis  To help prevent nausea and vomiting after your treatment, we encourage you to take your nausea medication as directed   If you develop nausea and vomiting that is not controlled by your nausea medication, call the clinic.   BELOW ARE SYMPTOMS THAT SHOULD BE REPORTED IMMEDIATELY:  *FEVER GREATER THAN 100.5 F  *CHILLS WITH OR WITHOUT FEVER  NAUSEA AND VOMITING THAT IS NOT CONTROLLED WITH YOUR NAUSEA MEDICATION  *UNUSUAL SHORTNESS OF BREATH  *UNUSUAL BRUISING OR BLEEDING  TENDERNESS IN MOUTH AND THROAT WITH OR WITHOUT PRESENCE OF ULCERS  *URINARY PROBLEMS  *BOWEL PROBLEMS  UNUSUAL RASH Items with * indicate a potential emergency and should be followed up as soon as possible.  Feel free to call the clinic you have any questions or concerns. The clinic phone number is [phone].  

## 2014-05-05 NOTE — Progress Notes (Signed)
Ok to treat today per Awilda Metro, [facility]

## 2014-05-05 NOTE — Patient Instructions (Signed)
Take a cough syrup (Hycodan) primarily at bedtime to help you rest Only take the Lasix tablet one tablet daily only as needed Followup in one week

## 2014-05-10 ENCOUNTER — Ambulatory Visit (HOSPITAL_BASED_OUTPATIENT_CLINIC_OR_DEPARTMENT_OTHER): Payer: Medicare Other

## 2014-05-10 ENCOUNTER — Other Ambulatory Visit (HOSPITAL_BASED_OUTPATIENT_CLINIC_OR_DEPARTMENT_OTHER): Payer: Medicare Other

## 2014-05-10 ENCOUNTER — Other Ambulatory Visit: Payer: Self-pay | Admitting: Hematology

## 2014-05-10 VITALS — BP 150/78 | HR 56 | Temp 98.2°F | Resp 18

## 2014-05-10 DIAGNOSIS — C9000 Multiple myeloma not having achieved remission: Secondary | ICD-10-CM

## 2014-05-10 DIAGNOSIS — Z5112 Encounter for antineoplastic immunotherapy: Secondary | ICD-10-CM

## 2014-05-10 LAB — COMPREHENSIVE METABOLIC PANEL
ALT: 7 U/L (ref 0–35)
AST: 17 U/L (ref 0–37)
Albumin: 3 g/dL — ABNORMAL LOW (ref 3.5–5.2)
Alkaline Phosphatase: 50 U/L (ref 39–117)
BUN: 40 mg/dL — ABNORMAL HIGH (ref 6–23)
CO2: 26 mEq/L (ref 19–32)
Calcium: 9 mg/dL (ref 8.4–10.5)
Chloride: 93 mEq/L — ABNORMAL LOW (ref 96–112)
Creatinine, Ser: 9.86 mg/dL — ABNORMAL HIGH (ref 0.50–1.10)
Glucose, Bld: 84 mg/dL (ref 70–99)
Potassium: 4.8 mEq/L (ref 3.5–5.3)
Sodium: 134 mEq/L — ABNORMAL LOW (ref 135–145)
Total Bilirubin: 0.4 mg/dL (ref 0.2–1.2)
Total Protein: 10.3 g/dL — ABNORMAL HIGH (ref 6.0–8.3)

## 2014-05-10 LAB — CBC WITH DIFFERENTIAL/PLATELET
BASO%: 0.6 % (ref 0.0–2.0)
Basophils Absolute: 0 10*3/uL (ref 0.0–0.1)
EOS%: 2.2 % (ref 0.0–7.0)
Eosinophils Absolute: 0.1 10*3/uL (ref 0.0–0.5)
HCT: 28.5 % — ABNORMAL LOW (ref 34.8–46.6)
HGB: 8.8 g/dL — ABNORMAL LOW (ref 11.6–15.9)
LYMPH#: 1.6 10*3/uL (ref 0.9–3.3)
LYMPH%: 44.7 % (ref 14.0–49.7)
MCH: 30.6 pg (ref 25.1–34.0)
MCHC: 30.9 g/dL — AB (ref 31.5–36.0)
MCV: 99 fL (ref 79.5–101.0)
MONO#: 0.5 10*3/uL (ref 0.1–0.9)
MONO%: 12.8 % (ref 0.0–14.0)
NEUT#: 1.4 10*3/uL — ABNORMAL LOW (ref 1.5–6.5)
NEUT%: 39.7 % (ref 38.4–76.8)
Platelets: 132 10*3/uL — ABNORMAL LOW (ref 145–400)
RBC: 2.88 10*6/uL — AB (ref 3.70–5.45)
RDW: 20.5 % — AB (ref 11.2–14.5)
WBC: 3.6 10*3/uL — ABNORMAL LOW (ref 3.9–10.3)
nRBC: 0 % (ref 0–0)

## 2014-05-10 MED ORDER — CARFILZOMIB CHEMO INJECTION 60 MG
15.0000 mg/m2 | Freq: Once | INTRAVENOUS | Status: AC
  Administered 2014-05-10: 26 mg via INTRAVENOUS
  Filled 2014-05-10: qty 13

## 2014-05-10 MED ORDER — DEXAMETHASONE SODIUM PHOSPHATE 10 MG/ML IJ SOLN
10.0000 mg | Freq: Once | INTRAMUSCULAR | Status: AC
  Administered 2014-05-10: 10 mg via INTRAVENOUS

## 2014-05-10 MED ORDER — ONDANSETRON 8 MG/NS 50 ML IVPB
INTRAVENOUS | Status: AC
  Filled 2014-05-10: qty 8

## 2014-05-10 MED ORDER — ONDANSETRON 8 MG/50ML IVPB (CHCC)
8.0000 mg | Freq: Once | INTRAVENOUS | Status: AC
  Administered 2014-05-10: 8 mg via INTRAVENOUS

## 2014-05-10 MED ORDER — SODIUM CHLORIDE 0.9 % IV SOLN
Freq: Once | INTRAVENOUS | Status: AC
  Administered 2014-05-10: 15:00:00 via INTRAVENOUS

## 2014-05-10 MED ORDER — SODIUM CHLORIDE 0.9 % IV SOLN: Freq: Once | INTRAVENOUS | Status: DC

## 2014-05-10 MED ORDER — DEXAMETHASONE SODIUM PHOSPHATE 10 MG/ML IJ SOLN
INTRAMUSCULAR | Status: AC
  Filled 2014-05-10: qty 1

## 2014-05-10 NOTE — Patient Instructions (Signed)
[facility] Discharge Instructions for Patients Receiving Chemotherapy  Today you received the following chemotherapy agent Kyprolis.  To help prevent nausea and vomiting after your treatment, we encourage you to take your nausea medication as directed by your physician.   If you develop nausea and vomiting that is not controlled by your nausea medication, call the clinic.   BELOW ARE SYMPTOMS THAT SHOULD BE REPORTED IMMEDIATELY:  *FEVER GREATER THAN 100.5 F  *CHILLS WITH OR WITHOUT FEVER  NAUSEA AND VOMITING THAT IS NOT CONTROLLED WITH YOUR NAUSEA MEDICATION  *UNUSUAL SHORTNESS OF BREATH  *UNUSUAL BRUISING OR BLEEDING  TENDERNESS IN MOUTH AND THROAT WITH OR WITHOUT PRESENCE OF ULCERS  *URINARY PROBLEMS  *BOWEL PROBLEMS  UNUSUAL RASH Items with * indicate a potential emergency and should be followed up as soon as possible.  Feel free to call the clinic you have any questions or concerns. The clinic phone number is [phone].

## 2014-05-10 NOTE — Progress Notes (Signed)
Pt here for chemo.  Pt insisted on seeing md prior to chemo.  Pt stated she wanted appetite medicine - " I eat but not as much as I did before ".  Pt already has office visit on 05/12/14.  Charge nurse Tanya spoke with [place], [place].  Received instructions re: pt can go forward with chemo today as planned, and to keep office visit appt on 05/12/14;  Pt can reschedule treatment today to Friday, and still keep follow up appt on Wed 8/12.  Explanations given to pt.  Pt still insisted to have medicine for appetite.  Burnetta Sabin, [place] notified.  Per PA, this issue can be addressed by md at pt's next office visit.  Pt agreed to receive chemo today as planned.

## 2014-05-12 ENCOUNTER — Ambulatory Visit (HOSPITAL_BASED_OUTPATIENT_CLINIC_OR_DEPARTMENT_OTHER): Payer: Medicare Other

## 2014-05-12 ENCOUNTER — Telehealth: Payer: Self-pay | Admitting: *Deleted

## 2014-05-12 ENCOUNTER — Telehealth: Payer: Self-pay | Admitting: Internal Medicine

## 2014-05-12 ENCOUNTER — Encounter: Payer: Self-pay | Admitting: Internal Medicine

## 2014-05-12 ENCOUNTER — Ambulatory Visit (HOSPITAL_BASED_OUTPATIENT_CLINIC_OR_DEPARTMENT_OTHER): Payer: Medicare Other | Admitting: Internal Medicine

## 2014-05-12 VITALS — BP 124/72 | HR 60 | Temp 98.4°F | Resp 18 | Ht 67.0 in | Wt 144.2 lb

## 2014-05-12 DIAGNOSIS — Z5112 Encounter for antineoplastic immunotherapy: Secondary | ICD-10-CM

## 2014-05-12 DIAGNOSIS — D61818 Other pancytopenia: Secondary | ICD-10-CM

## 2014-05-12 DIAGNOSIS — Z992 Dependence on renal dialysis: Secondary | ICD-10-CM

## 2014-05-12 DIAGNOSIS — R609 Edema, unspecified: Secondary | ICD-10-CM

## 2014-05-12 DIAGNOSIS — D649 Anemia, unspecified: Secondary | ICD-10-CM

## 2014-05-12 DIAGNOSIS — C9000 Multiple myeloma not having achieved remission: Secondary | ICD-10-CM

## 2014-05-12 DIAGNOSIS — N186 End stage renal disease: Secondary | ICD-10-CM

## 2014-05-12 MED ORDER — SODIUM CHLORIDE 0.9 % IV SOLN
Freq: Once | INTRAVENOUS | Status: AC
  Administered 2014-05-12: 15:00:00 via INTRAVENOUS

## 2014-05-12 MED ORDER — DEXTROSE 5 % IV SOLN
15.0000 mg/m2 | Freq: Once | INTRAVENOUS | Status: AC
  Administered 2014-05-12: 26 mg via INTRAVENOUS
  Filled 2014-05-12: qty 13

## 2014-05-12 MED ORDER — ONDANSETRON 8 MG/NS 50 ML IVPB
INTRAVENOUS | Status: AC
  Filled 2014-05-12: qty 8

## 2014-05-12 MED ORDER — DEXAMETHASONE SODIUM PHOSPHATE 10 MG/ML IJ SOLN
INTRAMUSCULAR | Status: AC
  Filled 2014-05-12: qty 1

## 2014-05-12 MED ORDER — ONDANSETRON 8 MG/50ML IVPB (CHCC)
8.0000 mg | Freq: Once | INTRAVENOUS | Status: AC
  Administered 2014-05-12: 8 mg via INTRAVENOUS

## 2014-05-12 MED ORDER — DEXAMETHASONE SODIUM PHOSPHATE 10 MG/ML IJ SOLN
10.0000 mg | Freq: Once | INTRAMUSCULAR | Status: AC
  Administered 2014-05-12: 10 mg via INTRAVENOUS

## 2014-05-12 MED ORDER — SODIUM CHLORIDE 0.9 % IV SOLN: Freq: Once | INTRAVENOUS | Status: DC

## 2014-05-12 NOTE — Telephone Encounter (Signed)
per pof to sch pt appt-sch & gave pt copy of sch °

## 2014-05-12 NOTE — Telephone Encounter (Signed)
per pof to sch trmt 9/7-[facility] closed that day-sent DC an email to see if trmt could be moved to another day

## 2014-05-12 NOTE — Telephone Encounter (Signed)
Per staff message and POF I have scheduled appts. Advised scheduler of appts. JMW  

## 2014-05-12 NOTE — Patient Instructions (Signed)
[facility] Discharge Instructions for Patients Receiving Chemotherapy  Today you received the following chemotherapy agents: Kyprolis  To help prevent nausea and vomiting after your treatment, we encourage you to take your nausea medication: as directed.   If you develop nausea and vomiting that is not controlled by your nausea medication, call the clinic.   BELOW ARE SYMPTOMS THAT SHOULD BE REPORTED IMMEDIATELY:  *FEVER GREATER THAN 100.5 F  *CHILLS WITH OR WITHOUT FEVER  NAUSEA AND VOMITING THAT IS NOT CONTROLLED WITH YOUR NAUSEA MEDICATION  *UNUSUAL SHORTNESS OF BREATH  *UNUSUAL BRUISING OR BLEEDING  TENDERNESS IN MOUTH AND THROAT WITH OR WITHOUT PRESENCE OF ULCERS  *URINARY PROBLEMS  *BOWEL PROBLEMS  UNUSUAL RASH Items with * indicate a potential emergency and should be followed up as soon as possible.  Feel free to call the clinic you have any questions or concerns. The clinic phone number is [phone].    

## 2014-05-13 NOTE — Progress Notes (Signed)
[name]  No PCP Per Patient No address on file  DIAGNOSIS: Multiple myeloma - Plan: CBC with Differential, Comprehensive metabolic panel (Cmet) - [facility], Lactate dehydrogenase (LDH) - [facility], SPEP & IFE with QIG, Kappa/lambda light chains, CBC with Differential, Basic metabolic panel (Bmet) - [facility], CBC with Differential, Basic metabolic panel (Bmet) - [facility], Ambulatory referral to Nutrition and Diabetic Education  Chief Complaint  Patient presents with  . Follow-up    CURRENT TREATMENT: Carfilzomib 15 mg/m2 on days 1,3, 8, 10,15, 17 (her dialysis is on day 2); Dexamethasone 12 mg weekly on days 1, 8, 15, 22; revlimid 5 mg daily for 28 days.  Started on 03/29/2014.  She received it on 6/29 and 03/31/2014; Doses on 7/6 and 7/8 were held due to severe anemia.     Multiple myeloma   03/30/2008 Initial Diagnosis Multiple myeloma   03/30/2008 - 05/01/2012 Chemotherapy Revlimid/Dex then Velcade (started on 07/13/2011 ending on 09/14/2011) without response.  Managed by [name] of [place], [place] [address]   07/24/2010 -  Chemotherapy Received zometa  intermittently over one year.  Creatinine was 1.0 on 07/24/2010. 1.7 on 08/10/2011.    05/01/2012 - 09/01/2012 Chemotherapy Starte Pomalidomide s/p 4 cycles. Referred to [facility] for consideration of ASCT.     10/08/2012 Bone Marrow Biopsy Normocellular marrow with residual/recurrently plasma cell myeloma, 60-70 % of overall marrow celluarity.  Flow: c/w plasma cell neoplasm, plasma cell infiltrate comprises 60-70 % of overall marrow cellularity; FISH: + for 1 q21/CKS1B gain; RB1 (13q14)   10/08/2012 Bone Marrow Biopsy Continued... Cytogenetics showed normal [name] karyotype.    10/29/2012 Tumor Marker High IgG (6019), high lamda free light chain (409.32) with low IgA, IgM and kappa free light chain.    04/21/2013 Imaging Skeletal survey.  No suspcious lytic or blastic lesions visualized.  Severe multilevel degenerative  disc disease in the cervical spine.    04/29/2013 Imaging PeT. No discrete suspicious focus of FDG uptake identified.    05/20/2013 - 06/09/2013 Chemotherapy Started carfilxomib 39 mg on 08/25, 08/26, day 8 (06/02/13), day 9 on 06/03/2013 and on 06/09/2013.     12/03/2013 Treatment Plan Change Last office visit with [name].  She decline further chemotherapy.   Her son passed away and she relocated to [place] to stay with her daughter [name].    03/11/2014 - 03/17/2014 Hospital Admission Admitted for low hemoglobin. [name] consulted.  SPEP,  UPEP and Bone marrow biopsy obtained.    IgG 11,300. M-spike 7.52, kappa lamda ratio 0 with lambda free light chains totaling 1260.    03/12/2014 Imaging Skeletal survey. 1. Small lytic lesions within the skull and right scapula. 2. Degenerative changes in the spine and knees, right greater than left. 3. Cardiomegaly without pulmonary edema.   03/17/2014 Bone Marrow Biopsy Hypercellular bone marrow tih extensive involvment by plasma cell neoplasm (Plasma cells 85%). FISH: Presence of 2 copies of the IGH/FGFR3, ATM, CCND1/IGH, and p53 probes.  Loss of 13q34 probes in 40% of cells. gain of chromose 12 and either loss of chorm   03/25/2014 -  Chemotherapy Planning Kyprolis 15 mg/m2 plus dexamethasone weekly plus revlimid (97m) daily on 06/29.     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 She was initially consulted by [place] 03/17/14. She was last seen by PBarrington Ellisonon 05/03/2014.  Today,she reports feeling much better with improved energy.  She reports seeing dentistry and completing a course of antibiotics with resolution of her dental pain.  She denies fevers or chills. She had a recent hospitalization (6/11 - 03/17/2014) due to profound anemia. She was transfused packed RBCs doing this admission.   She  complains of swelling in her ankles. She undergoes dialysis every Tuesday, Thursday and Sat. Her revlimid dose was reduced 50%.  She has not restarted it yet.   MEDICAL HISTORY: Past Medical History  Diagnosis Date  . Renal disorder   . ESRD (end stage renal disease)   . Hypertension   . Multiple myeloma 2009  . Thyroid disease   . Hyperparathyroidism   . Thrombocytopenia   . Anemia   . Shortness of breath   . Pneumonia   . GERD (gastroesophageal reflux disease)   . Arthritis     rt knee is stiff    INTERIM HISTORY: has BRBPR (bright red blood per rectum); Acute blood loss anemia; ESRD (end stage renal disease); Multiple myeloma; Pancytopenia; and HTN (hypertension) on her problem list.    ALLERGIES:  has No Known Allergies.  MEDICATIONS: has a current medication list which includes the following prescription(s): acetaminophen-codeine, acyclovir, amlodipine, aspirin ec, cyproheptadine, dexamethasone, furosemide, hydralazine, hydrocodone-homatropine, isosorbide mononitrate, lenalidomide, lorazepam, metolazone, multiple vitamins-iron, nifedipine, ondansetron, oxybutynin, prochlorperazine, and sevelamer carbonate.  SURGICAL HISTORY:  Past Surgical History  Procedure Laterality Date  . Abdominal hysterectomy      Pt. denies    REVIEW OF SYSTEMS:   Constitutional: Denies fevers, chills or abnormal weight loss Eyes: Denies blurriness of vision Ears, nose, mouth, throat, and face: Denies mucositis or sore throat Respiratory: Denies cough, dyspnea or wheezes Cardiovascular: Denies palpitation, chest discomfort or lower extremity swelling Gastrointestinal:  Denies nausea, heartburn or change in bowel habits Skin: Denies abnormal skin rashes Lymphatics: Denies new lymphadenopathy or easy bruising Neurological:Denies numbness, tingling or new weaknesses Behavioral/Psych: Mood is stable, no new changes  All other systems were reviewed with the patient and are negative.  PHYSICAL  EXAMINATION: ECOG PERFORMANCE STATUS: 1 - Symptomatic but completely ambulatory  Blood pressure 124/72, pulse 60, temperature 98.4 F (36.9 C), temperature source Oral, resp. rate 18, height '5\' 7"'  (1.702 m), weight 144 lb 3.2 oz (65.409 kg), SpO2 100.00%.  GENERAL:alert, no distress and comfortable; well developed and well nourished. Anxious SKIN: skin color, texture, turgor are normal, no rashes or significant lesions EYES: normal, Conjunctiva are pink and non-injected, sclera clear OROPHARYNX:no exudate, no erythema and lips, buccal mucosa, and tongue normal  NECK: supple, thyroid normal size, non-tender, without nodularity LYMPH:  no palpable lymphadenopathy in the cervical, axillary or supraclavicular LUNGS: clear to auscultation with normal breathing effort, no wheezes or rhonchi HEART: regular rate & rhythm and no murmurs and 1+ edema in ankles bilaterally; L AVF ABDOMEN:abdomen soft, non-tender and normal bowel sounds Musculoskeletal:no cyanosis of digits and no clubbing  NEURO: alert & oriented x 3 with fluent speech, no focal motor/sensory deficits  Labs:  Lab Results  Component Value Date   WBC 3.6* 05/10/2014   HGB 8.8* 05/10/2014   HCT 28.5* 05/10/2014   MCV 99.0 05/10/2014   PLT 132* 05/10/2014   NEUTROABS 1.4* 05/10/2014      Chemistry      Component Value Date/Time   NA 134* 05/10/2014 1439   NA 134* 04/26/2014 1224   K 4.8 05/10/2014 1439   K 5.5* 04/26/2014 1224   CL 93* 05/10/2014 1439  CO2 26 05/10/2014 1439   CO2 24 04/26/2014 1224   BUN 40* 05/10/2014 1439   BUN 69.2* 04/26/2014 1224   CREATININE 9.86* 05/10/2014 1439   CREATININE 8.6* 04/26/2014 1224      Component Value Date/Time   CALCIUM 9.0 05/10/2014 1439   CALCIUM 7.9* 04/26/2014 1224   ALKPHOS 50 05/10/2014 1439   ALKPHOS 49 04/26/2014 1224   AST 17 05/10/2014 1439   AST 26 04/26/2014 1224   ALT 7 05/10/2014 1439   ALT 16 04/26/2014 1224   BILITOT 0.4 05/10/2014 1439   BILITOT 0.67 04/26/2014 1224        Basic Metabolic Panel:  Recent Labs Lab 05/10/14 1439  NA 134*  K 4.8  CL 93*  CO2 26  GLUCOSE 84  BUN 40*  CREATININE 9.86*  CALCIUM 9.0   GFR Estimated Creatinine Clearance: 4.8 ml/min (by C-G formula based on Cr of 9.86). Liver Function Tests:  Recent Labs Lab 05/10/14 1439  AST 17  ALT 7  ALKPHOS 50  BILITOT 0.4  PROT 10.3*  ALBUMIN 3.0*   No results found for this basename: LIPASE, AMYLASE,  in the last 168 hours No results found for this basename: AMMONIA,  in the last 168 hours Coagulation profile No results found for this basename: INR, PROTIME,  in the last 168 hours  CBC:  Recent Labs Lab 05/10/14 1303  WBC 3.6*  NEUTROABS 1.4*  HGB 8.8*  HCT 28.5*  MCV 99.0  PLT 132*    Anemia work up No results found for this basename: VITAMINB12, FOLATE, FERRITIN, TIBC, IRON, RETICCTPCT,  in the last 72 hours  Studies:  No results found.   RADIOGRAPHIC STUDIES: Ct Biopsy  03/17/2014   CLINICAL DATA:  73 year old with history of myeloma and anemia.  EXAM: CT GUIDED BONE MARROW ASPIRATES AND BIOPSY  Physician: Stephan Minister. Henn, MD  MEDICATIONS: 2 mg Versed, 50 mcg fentanyl. A radiology nurse monitored the patient for moderate sedation.  ANESTHESIA/SEDATION: Sedation time: 10 min  PROCEDURE: The procedure was explained to the patient. The risks and benefits of the procedure were discussed and the patient's questions were addressed. Informed consent was obtained from the patient. The patient was placed prone on CT scan. Images of the pelvis were obtained. The right side of back was prepped and draped in sterile fashion. The skin and right posterior iliac bone were anesthetized with 1% lidocaine. 11 gauge bone needle was directed into the right iliac bone with CT guidance. Two aspirates and one core biopsy obtained.  FINDINGS: Needle directed into the right iliac bone. Subtle areas of lucency throughout the pelvic bones. Findings are consistent with history of  myeloma.  COMPLICATIONS: None  IMPRESSION: CT guided bone marrow aspirates and core biopsy.   Electronically Signed   By: Markus Daft M.D.   On: 03/17/2014 13:07   Dg Bone Survey Met  03/12/2014   CLINICAL DATA:  Myeloma.  Right knee pain.  EXAM: METASTATIC BONE SURVEY  COMPARISON:  None.  FINDINGS: Multiple images are performed of the axial and appendicular skeleton.  Multiple small lytic lesions are identified within the skull consistent with myeloma. A small lytic lesion is identified within the right scapula.  Degenerative changes are identified within the mid cervical spine. No suspicious lytic or blastic lesions are identified within the spine, or long bones. Surgical clips are identified in the left upper arm. Degenerative changes are seen in the knees bilaterally, right greater than left.  The heart is mildly  enlarged. There are areas of atelectasis within the right lung. No focal consolidations or pulmonary edema.  IMPRESSION: 1. Small lytic lesions within the skull and right scapula. 2. Degenerative changes in the spine and knees, right greater than left. 3. Cardiomegaly without pulmonary edema.   Electronically Signed   By: Shon Hale M.D.   On: 03/12/2014 10:13     ASSESSMENT: [name] 73 y.o. [name] with a history of Multiple myeloma - Plan: CBC with Differential, Comprehensive metabolic panel (Cmet) - [facility], Lactate dehydrogenase (LDH) - [facility], SPEP & IFE with QIG, Kappa/lambda light chains, CBC with Differential, Basic metabolic panel (Bmet) - [facility], CBC with Differential, Basic metabolic panel (Bmet) - [facility], Ambulatory referral to Nutrition and Diabetic Education   PLAN:   1. IgG Lambda MM, rapidly progressive.  --On prior visits, I reviewed extensively her medical records from [place], [place] and her recent work out. Her Bone marrow is packed with 85% plasma cells likely explaining her refractory anemia and pancytopenia. Her protein was 14.7 down to 12.5 now 10.3 since starting  chemotherapy. Her case has been difficult due to a history of non-compliance. She stated that she stopped her recent treatment in [place] due to being hospitalized. Per records from her primary oncologist's office, it was likely due to her kidney failure due to progression of her multiple myeloma. Her last treatment is as noted above. Considering she received only one cycle of carfilzomib plus dexamethasone, we started her back on this regiment based on Nicole Kindred et. Al, NEJM, 2015).   *Carfilzomib (15 mg/m2) on days 1, 3, 8, 10, 15, 17  *Lenalidomide 41m daily (please take follow H/D on Tue/th/Sa). --dose reduced by 50% due to delayed count recovery.  *Dexamethasone 12 mg weekly  We presented the treatment indications, benefits and side-effects including but not limited to myelosuprresion which can result in life-threatening infections and anemia and low plts, and nausea or vomiting or rashes.  She understood the indications, risks and benefits and chose to proceed. She started therapy on 03/29/2014. She received 4 total doses of carfilzomib for cycle #1 and a total of 5 doses for cycle #2 ( including the one scheduled for today).  She will resume Kyprolis on 08/24 with cycle #3, day #1.  We will continue to follow labs weekly starting on 08/24.   She was instructed to resume her revlimid.   We will obtain Multiple myeloma markers monthly starting on 08/24.  Markers from last month revealed a decrease in her M-spike.   2. Pancytopenia with anemia likely multifactorial.  --Secondary to #1. We will start on aranesp biweekly if not provided with her H/D.  She cannot have transfusions on the days of her dialysis (Tues, Thurs and Sat). Her ANC is 1,400. She was advised extensively on symptoms of anemia.  Her hemoglobin is 8.8.   Her plts are up to 132.  3. ESRDz.  --Likely secondary to #1. Continue hemodialysis on Tues, Thursday and Sat.   4. Psuedohyponatremia.  --Likely due to #1. Treat underlying disorder.  Her sodium is up to 134 from 128 from 125 prior to treatment on 03/18/2014.   5. History of Noncompliance.   6. Lower extremity swelling. --.  May require additional fluid pulled off with h/d.   7. Poor nutrition. --She has had dental pain with resolution which may in part explain some weight lost.  We will refer to nutrition with consideration for nutritional supplementation with boost and/or ensure.   8. Follow up.  --Patient  will follow up in our office in four weeks for symptom visit while on  Carfilzomib, lenalidomide and dexamethasone.   She was provided anti-emetics and acyclovir.   All questions were answered. The patient knows to call the clinic with any problems, questions or concerns. We can certainly see the patient much sooner if necessary.  I spent 15 minutes counseling the patient face to face. The total time spent in the appointment was 25 minutes.    Emauri Krygier, MD 05/13/2014 5:46 AM

## 2014-05-24 ENCOUNTER — Ambulatory Visit (HOSPITAL_BASED_OUTPATIENT_CLINIC_OR_DEPARTMENT_OTHER): Payer: Medicare Other

## 2014-05-24 ENCOUNTER — Other Ambulatory Visit (HOSPITAL_BASED_OUTPATIENT_CLINIC_OR_DEPARTMENT_OTHER): Payer: Medicare Other

## 2014-05-24 ENCOUNTER — Other Ambulatory Visit: Payer: Self-pay | Admitting: Internal Medicine

## 2014-05-24 VITALS — BP 146/69 | HR 53 | Temp 98.4°F | Resp 16

## 2014-05-24 DIAGNOSIS — C9000 Multiple myeloma not having achieved remission: Secondary | ICD-10-CM

## 2014-05-24 DIAGNOSIS — Z5112 Encounter for antineoplastic immunotherapy: Secondary | ICD-10-CM

## 2014-05-24 LAB — CBC WITH DIFFERENTIAL/PLATELET
BASO%: 1.2 % (ref 0.0–2.0)
Basophils Absolute: 0 10*3/uL (ref 0.0–0.1)
EOS%: 4.3 % (ref 0.0–7.0)
Eosinophils Absolute: 0.1 10*3/uL (ref 0.0–0.5)
HEMATOCRIT: 27.4 % — AB (ref 34.8–46.6)
HEMOGLOBIN: 8.7 g/dL — AB (ref 11.6–15.9)
LYMPH#: 1.2 10*3/uL (ref 0.9–3.3)
LYMPH%: 41.4 % (ref 14.0–49.7)
MCH: 32.1 pg (ref 25.1–34.0)
MCHC: 31.9 g/dL (ref 31.5–36.0)
MCV: 100.9 fL (ref 79.5–101.0)
MONO#: 0.2 10*3/uL (ref 0.1–0.9)
MONO%: 6.1 % (ref 0.0–14.0)
NEUT#: 1.4 10*3/uL — ABNORMAL LOW (ref 1.5–6.5)
NEUT%: 47 % (ref 38.4–76.8)
Platelets: 118 10*3/uL — ABNORMAL LOW (ref 145–400)
RBC: 2.71 10*6/uL — ABNORMAL LOW (ref 3.70–5.45)
RDW: 22.6 % — ABNORMAL HIGH (ref 11.2–14.5)
WBC: 3 10*3/uL — ABNORMAL LOW (ref 3.9–10.3)

## 2014-05-24 LAB — COMPREHENSIVE METABOLIC PANEL (CC13)
ALT: 6 U/L (ref 0–55)
AST: 17 U/L (ref 5–34)
Albumin: 2.9 g/dL — ABNORMAL LOW (ref 3.5–5.0)
Alkaline Phosphatase: 49 U/L (ref 40–150)
Anion Gap: 6 mEq/L (ref 3–11)
BUN: 47.4 mg/dL — AB (ref 7.0–26.0)
CHLORIDE: 98 meq/L (ref 98–109)
CO2: 26 mEq/L (ref 22–29)
CREATININE: 9 mg/dL — AB (ref 0.6–1.1)
Calcium: 8.9 mg/dL (ref 8.4–10.4)
GLUCOSE: 80 mg/dL (ref 70–140)
Potassium: 5.2 mEq/L — ABNORMAL HIGH (ref 3.5–5.1)
Sodium: 131 mEq/L — ABNORMAL LOW (ref 136–145)
Total Bilirubin: 0.56 mg/dL (ref 0.20–1.20)
Total Protein: 9.4 g/dL — ABNORMAL HIGH (ref 6.4–8.3)

## 2014-05-24 LAB — LACTATE DEHYDROGENASE (CC13): LDH: 199 U/L (ref 125–245)

## 2014-05-24 MED ORDER — SODIUM CHLORIDE 0.9 % IV SOLN: Freq: Once | INTRAVENOUS | Status: DC

## 2014-05-24 MED ORDER — DEXAMETHASONE SODIUM PHOSPHATE 10 MG/ML IJ SOLN
10.0000 mg | Freq: Once | INTRAMUSCULAR | Status: AC
  Administered 2014-05-24: 10 mg via INTRAVENOUS

## 2014-05-24 MED ORDER — DEXTROSE 5 % IV SOLN
15.0000 mg/m2 | Freq: Once | INTRAVENOUS | Status: AC
  Administered 2014-05-24: 26 mg via INTRAVENOUS
  Filled 2014-05-24: qty 13

## 2014-05-24 MED ORDER — ONDANSETRON 8 MG/50ML IVPB (CHCC)
8.0000 mg | Freq: Once | INTRAVENOUS | Status: AC
  Administered 2014-05-24: 8 mg via INTRAVENOUS

## 2014-05-24 MED ORDER — DEXAMETHASONE SODIUM PHOSPHATE 10 MG/ML IJ SOLN
INTRAMUSCULAR | Status: AC
  Filled 2014-05-24: qty 1

## 2014-05-24 MED ORDER — SODIUM CHLORIDE 0.9 % IV SOLN
INTRAVENOUS | Status: DC
  Administered 2014-05-24: 14:00:00 via INTRAVENOUS

## 2014-05-24 NOTE — Progress Notes (Signed)
Pt. is a dialysis pt., refused to have lab drawn in laboratory.  Ok to draw lab in infusion.  Lab drawn with IV start. Pt. tolerated procedure well.

## 2014-05-24 NOTE — Progress Notes (Signed)
OK to treat per Dr. Juliann Mule. Confirmed she took her po Decadron at home-will only give 10 mg IV here.

## 2014-05-24 NOTE — Patient Instructions (Addendum)
[facility] Discharge Instructions for Patients Receiving Chemotherapy  Today you received the following chemotherapy agents Kyprolis.  To help prevent nausea and vomiting after your treatment, we encourage you to take your nausea medication. Zofran 8 mg Take 1 tablet twice a day for 2 days starting the day after chemo.  Compazine 6 mg 1 tablet every 6 hours as needed for nausea.  Ativan 0.5mg . Take 1 tablet every 6 hours as needed for nausea.   If you develop nausea and vomiting that is not controlled by your nausea medication, call the clinic.   BELOW ARE SYMPTOMS THAT SHOULD BE REPORTED IMMEDIATELY:  *FEVER GREATER THAN 100.5 F  *CHILLS WITH OR WITHOUT FEVER  NAUSEA AND VOMITING THAT IS NOT CONTROLLED WITH YOUR NAUSEA MEDICATION  *UNUSUAL SHORTNESS OF BREATH  *UNUSUAL BRUISING OR BLEEDING  TENDERNESS IN MOUTH AND THROAT WITH OR WITHOUT PRESENCE OF ULCERS  *URINARY PROBLEMS  *BOWEL PROBLEMS  UNUSUAL RASH Items with * indicate a potential emergency and should be followed up as soon as possible.  Feel free to call the clinic you have any questions or concerns. The clinic phone number is [phone].

## 2014-05-25 ENCOUNTER — Other Ambulatory Visit: Payer: Self-pay | Admitting: *Deleted

## 2014-05-25 DIAGNOSIS — C9000 Multiple myeloma not having achieved remission: Secondary | ICD-10-CM

## 2014-05-25 MED ORDER — LENALIDOMIDE 2.5 MG PO CAPS: 2.5000 mg | ORAL_CAPSULE | Freq: Every day | ORAL | Status: DC

## 2014-05-25 NOTE — Addendum Note (Signed)
Addended by: Wyonia Hough on: 05/25/2014 03:36 PM   Modules accepted: Orders

## 2014-05-25 NOTE — Telephone Encounter (Signed)
THIS REFILL REQUEST FOR REVLIMID WAS GIVEN TO DR.CHISM'S NURSE, ROBIN BASS,RN. 

## 2014-05-26 ENCOUNTER — Other Ambulatory Visit: Payer: Medicare Other

## 2014-05-26 ENCOUNTER — Ambulatory Visit (HOSPITAL_BASED_OUTPATIENT_CLINIC_OR_DEPARTMENT_OTHER): Payer: Medicare Other

## 2014-05-26 VITALS — BP 104/60 | HR 60 | Temp 98.7°F | Resp 18

## 2014-05-26 DIAGNOSIS — Z5112 Encounter for antineoplastic immunotherapy: Secondary | ICD-10-CM

## 2014-05-26 DIAGNOSIS — C9000 Multiple myeloma not having achieved remission: Secondary | ICD-10-CM

## 2014-05-26 MED ORDER — SODIUM CHLORIDE 0.9 % IV SOLN
Freq: Once | INTRAVENOUS | Status: AC
  Administered 2014-05-26: 14:00:00 via INTRAVENOUS

## 2014-05-26 MED ORDER — ONDANSETRON 8 MG/NS 50 ML IVPB
INTRAVENOUS | Status: AC
  Filled 2014-05-26: qty 8

## 2014-05-26 MED ORDER — DEXAMETHASONE SODIUM PHOSPHATE 10 MG/ML IJ SOLN
10.0000 mg | Freq: Once | INTRAMUSCULAR | Status: AC
  Administered 2014-05-26: 10 mg via INTRAVENOUS

## 2014-05-26 MED ORDER — DEXAMETHASONE SODIUM PHOSPHATE 10 MG/ML IJ SOLN
INTRAMUSCULAR | Status: AC
  Filled 2014-05-26: qty 1

## 2014-05-26 MED ORDER — DEXTROSE 5 % IV SOLN
15.0000 mg/m2 | Freq: Once | INTRAVENOUS | Status: AC
  Administered 2014-05-26: 26 mg via INTRAVENOUS
  Filled 2014-05-26: qty 13

## 2014-05-26 MED ORDER — ONDANSETRON 8 MG/50ML IVPB (CHCC)
8.0000 mg | Freq: Once | INTRAVENOUS | Status: AC
  Administered 2014-05-26: 8 mg via INTRAVENOUS

## 2014-05-26 NOTE — Patient Instructions (Addendum)
[facility] Discharge Instructions for Patients Receiving Chemotherapy  Today you received the following chemotherapy agents Kyprolis  To help prevent nausea and vomiting after your treatment, we encourage you to take your nausea medication    If you develop nausea and vomiting that is not controlled by your nausea medication, call the clinic.   BELOW ARE SYMPTOMS THAT SHOULD BE REPORTED IMMEDIATELY:  *FEVER GREATER THAN 100.5 F  *CHILLS WITH OR WITHOUT FEVER  NAUSEA AND VOMITING THAT IS NOT CONTROLLED WITH YOUR NAUSEA MEDICATION  *UNUSUAL SHORTNESS OF BREATH  *UNUSUAL BRUISING OR BLEEDING  TENDERNESS IN MOUTH AND THROAT WITH OR WITHOUT PRESENCE OF ULCERS  *URINARY PROBLEMS  *BOWEL PROBLEMS  UNUSUAL RASH Items with * indicate a potential emergency and should be followed up as soon as possible.  Feel free to call the clinic you have any questions or concerns. The clinic phone number is [phone].   3

## 2014-05-27 LAB — SPEP & IFE WITH QIG
Albumin ELP: 42.6 % — ABNORMAL LOW (ref 55.8–66.1)
Alpha-1-Globulin: 3.7 % (ref 2.9–4.9)
Alpha-2-Globulin: 5.9 % — ABNORMAL LOW (ref 7.1–11.8)
BETA 2: 2 % — AB (ref 3.2–6.5)
Beta Globulin: 3.4 % — ABNORMAL LOW (ref 4.7–7.2)
GAMMA GLOBULIN: 42.4 % — AB (ref 11.1–18.8)
IgG (Immunoglobin G), Serum: 4820 mg/dL — ABNORMAL HIGH (ref 690–1700)
IgM, Serum: 5 mg/dL — ABNORMAL LOW (ref 52–322)
M-SPIKE, %: 3.59 g/dL
Total Protein, Serum Electrophoresis: 9.1 g/dL — ABNORMAL HIGH (ref 6.0–8.3)

## 2014-05-27 LAB — KAPPA/LAMBDA LIGHT CHAINS
KAPPA FREE LGHT CHN: 1.26 mg/dL (ref 0.33–1.94)
Kappa:Lambda Ratio: 0 — ABNORMAL LOW (ref 0.26–1.65)
Lambda Free Lght Chn: 608 mg/dL — ABNORMAL HIGH (ref 0.57–2.63)

## 2014-05-31 ENCOUNTER — Ambulatory Visit (HOSPITAL_BASED_OUTPATIENT_CLINIC_OR_DEPARTMENT_OTHER): Payer: Medicare Other

## 2014-05-31 ENCOUNTER — Other Ambulatory Visit: Payer: Self-pay | Admitting: Hematology

## 2014-05-31 ENCOUNTER — Other Ambulatory Visit (HOSPITAL_BASED_OUTPATIENT_CLINIC_OR_DEPARTMENT_OTHER): Payer: Medicare Other

## 2014-05-31 ENCOUNTER — Other Ambulatory Visit: Payer: Self-pay | Admitting: Nurse Practitioner

## 2014-05-31 VITALS — BP 118/59 | HR 49 | Temp 97.9°F | Resp 18

## 2014-05-31 DIAGNOSIS — C9000 Multiple myeloma not having achieved remission: Secondary | ICD-10-CM

## 2014-05-31 LAB — BASIC METABOLIC PANEL (CC13)
Anion Gap: 9 mEq/L (ref 3–11)
BUN: 61.1 mg/dL — ABNORMAL HIGH (ref 7.0–26.0)
CHLORIDE: 99 meq/L (ref 98–109)
CO2: 24 mEq/L (ref 22–29)
CREATININE: 10.1 mg/dL — AB (ref 0.6–1.1)
Calcium: 8.5 mg/dL (ref 8.4–10.4)
Glucose: 78 mg/dl (ref 70–140)
Potassium: 4.8 mEq/L (ref 3.5–5.1)
SODIUM: 132 meq/L — AB (ref 136–145)

## 2014-05-31 LAB — CBC WITH DIFFERENTIAL/PLATELET
BASO%: 0.5 % (ref 0.0–2.0)
Basophils Absolute: 0 10*3/uL (ref 0.0–0.1)
EOS%: 6.6 % (ref 0.0–7.0)
Eosinophils Absolute: 0.1 10*3/uL (ref 0.0–0.5)
HCT: 28.2 % — ABNORMAL LOW (ref 34.8–46.6)
HGB: 9 g/dL — ABNORMAL LOW (ref 11.6–15.9)
LYMPH%: 56.3 % — AB (ref 14.0–49.7)
MCH: 32.6 pg (ref 25.1–34.0)
MCHC: 31.9 g/dL (ref 31.5–36.0)
MCV: 102.2 fL — ABNORMAL HIGH (ref 79.5–101.0)
MONO#: 0.3 10*3/uL (ref 0.1–0.9)
MONO%: 12.2 % (ref 0.0–14.0)
NEUT%: 24.4 % — ABNORMAL LOW (ref 38.4–76.8)
NEUTROS ABS: 0.5 10*3/uL — AB (ref 1.5–6.5)
PLATELETS: 119 10*3/uL — AB (ref 145–400)
RBC: 2.76 10*6/uL — AB (ref 3.70–5.45)
RDW: 21.1 % — ABNORMAL HIGH (ref 11.2–14.5)
WBC: 2.1 10*3/uL — ABNORMAL LOW (ref 3.9–10.3)
lymph#: 1.2 10*3/uL (ref 0.9–3.3)
nRBC: 0 % (ref 0–0)

## 2014-05-31 MED ORDER — SODIUM CHLORIDE 0.9 % IV SOLN
Freq: Once | INTRAVENOUS | Status: AC
  Administered 2014-05-31: 14:00:00 via INTRAVENOUS

## 2014-05-31 NOTE — Progress Notes (Signed)
Reviewed with patient neutropenic precautions. Instructed her when to wear mask and to avoid crowds and children. Verbalized understanding.

## 2014-05-31 NOTE — Progress Notes (Signed)
ANC 0.5; Per Dr. Julien Nordmann, on call MD, and Dr. Lona Kettle, provider 1, hold chemotherapy this week, pt to return as scheduled next week. Per Dr. Lona Kettle, hold Revlimid until next appointment on 06/09/14. Patient informed of holding chemotherapy and rationale and instructions for holding oral chemo; she verbalizes understanding. Printed in AVS as well. In addition, MD informed of creatinine 10.1; patient receives dialysis 3xper week, no new orders given.

## 2014-05-31 NOTE — Patient Instructions (Signed)
Neutropenia  We are holding your IV chemotherapy this week due to low blood counts. Please also stop taking Revlimid (oral chemotherapy) until you see Dr. Lona Kettle (your new oncologist) on Wednesday 06/09/14. Monday 06/07/14, is a holiday, you will not get treatment that day. Take your decadron (steroid) on Wednesday 06/09/14 before your treatment.   Neutropenia is a condition that occurs when the level of a certain type of white blood cell (neutrophil) in your body becomes lower than normal. Neutrophils are made in the bone marrow and fight infections. These cells protect against bacteria and viruses. The fewer neutrophils you have, and the longer your body remains without them, the greater your risk of getting a severe infection becomes. CAUSES  The cause of neutropenia may be hard to determine. However, it is usually due to 3 main problems:   Decreased production of neutrophils. This may be due to:  Certain medicines such as chemotherapy.  Genetic problems.  Cancer.  Radiation treatments.  Vitamin deficiency.  Some pesticides.  Increased destruction of neutrophils. This may be due to:  Overwhelming infections.  Hemolytic anemia. This is when the body destroys its own blood cells.  Chemotherapy.  Neutrophils moving to areas of the body where they cannot fight infections. This may be due to:  Dialysis procedures.  Conditions where the spleen becomes enlarged. Neutrophils are held in the spleen and are not available to the rest of the body.  Overwhelming infections. The neutrophils are held in the area of the infection and are not available to the rest of the body. SYMPTOMS  There are no specific symptoms of neutropenia. The lack of neutrophils can result in an infection, and an infection can cause various problems. DIAGNOSIS  Diagnosis is made by a blood test. A complete blood count is performed. The normal level of neutrophils in human blood differs with age and race. Infants have  lower counts than older children and adults. African Americans have lower counts than Caucasians or Asians. The average adult level is 1500 cells/mm3 of blood. Neutrophil counts are interpreted as follows:  Greater than 1000 cells/mm3 gives normal protection against infection.  500 to 1000 cells/mm3 gives an increased risk for infection.  200 to 500 cells/mm3 is a greater risk for severe infection.  Lower than 200 cells/mm3 is a marked risk of infection. This may require hospitalization and treatment with antibiotic medicines. TREATMENT  Treatment depends on the underlying cause, severity, and presence of infections or symptoms. It also depends on your health. Your caregiver will discuss the treatment plan with you. Mild cases are often easily treated and have a good outcome. Preventative measures may also be started to limit your risk of infections. Treatment can include:  Taking antibiotics.  Stopping medicines that are known to cause neutropenia.  Correcting nutritional deficiencies by eating green vegetables to supply folic acid and taking vitamin B supplements.  Stopping exposure to pesticides if your neutropenia is related to pesticide exposure.  Taking a blood growth factor called sargramostim, pegfilgrastim, or filgrastim if you are undergoing chemotherapy for cancer. This stimulates white blood cell production.  Removal of the spleen if you have Felty's syndrome and have repeated infections. HOME CARE INSTRUCTIONS   Follow your caregiver's instructions about when you need to have blood work done.  Wash your hands often. Make sure others who come in contact with you also wash their hands.  Wash raw fruits and vegetables before eating them. They can carry bacteria and fungi.  Avoid people with colds  or spreadable (contagious) diseases (chickenpox, herpes zoster, influenza).  Avoid large crowds.  Avoid construction areas. The dust can release fungus into the air.  Be  cautious around children in daycare or school environments.  Take care of your respiratory system by coughing and deep breathing.  Bathe daily.  Protect your skin from cuts and burns.  Do not work in the garden or with flowers and plants.  Care for the mouth before and after meals by brushing with a soft toothbrush. If you have mucositis, do not use mouthwash. Mouthwash contains alcohol and can dry out the mouth even more.  Clean the area between the genitals and the anus (perineal area) after urination and bowel movements. Women need to wipe from front to back.  Use a water soluble lubricant during sexual intercourse and practice good hygiene after. Do not have intercourse if you are severely neutropenic. Check with your caregiver for guidelines.  Exercise daily as tolerated.  Avoid people who were vaccinated with a live vaccine in the past 30 days. You should not receive live vaccines (polio, typhoid).  Do not provide direct care for pets. Avoid animal droppings. Do not clean litter boxes and bird cages.  Do not share food utensils.  Do not use tampons, enemas, or rectal suppositories unless directed by your caregiver.  Use an electric razor to remove hair.  Wash your hands after handling magazines, letters, and newspapers. SEEK IMMEDIATE MEDICAL CARE IF:   You have a fever.  You have chills or start to shake.  You feel nauseous or vomit.  You develop mouth sores.  You develop aches and pains.  You have redness and swelling around open wounds.  Your skin is warm to the touch.  You have pus coming from your wounds.  You develop swollen lymph nodes.  You feel weak or fatigued.  You develop red streaks on the skin. MAKE SURE YOU:  Understand these instructions.  Will watch your condition.  Will get help right away if you are not doing well or get worse. Document Released: 03/09/2002 Document Revised: 12/10/2011 Document Reviewed: 04/06/2011 [facility] Patient  Information 2015 [place], [place]. This information is not intended to replace advice given to you by your health care provider. Make sure you discuss any questions you have with your health care provider.

## 2014-06-02 ENCOUNTER — Ambulatory Visit: Payer: Self-pay

## 2014-06-02 ENCOUNTER — Other Ambulatory Visit: Payer: Self-pay

## 2014-06-09 ENCOUNTER — Other Ambulatory Visit: Payer: Self-pay

## 2014-06-09 ENCOUNTER — Ambulatory Visit: Payer: Self-pay

## 2014-06-09 ENCOUNTER — Telehealth: Payer: Self-pay | Admitting: Hematology

## 2014-06-09 ENCOUNTER — Other Ambulatory Visit (HOSPITAL_BASED_OUTPATIENT_CLINIC_OR_DEPARTMENT_OTHER): Payer: Medicare Other

## 2014-06-09 ENCOUNTER — Encounter: Payer: Self-pay | Admitting: Hematology

## 2014-06-09 ENCOUNTER — Telehealth: Payer: Self-pay | Admitting: *Deleted

## 2014-06-09 ENCOUNTER — Ambulatory Visit (HOSPITAL_BASED_OUTPATIENT_CLINIC_OR_DEPARTMENT_OTHER): Payer: Medicare Other | Admitting: Hematology

## 2014-06-09 VITALS — BP 127/80 | HR 52 | Temp 98.4°F | Resp 19 | Ht 67.0 in | Wt 138.6 lb

## 2014-06-09 DIAGNOSIS — Z5189 Encounter for other specified aftercare: Secondary | ICD-10-CM

## 2014-06-09 DIAGNOSIS — D61818 Other pancytopenia: Secondary | ICD-10-CM | POA: Diagnosis not present

## 2014-06-09 DIAGNOSIS — D702 Other drug-induced agranulocytosis: Secondary | ICD-10-CM

## 2014-06-09 DIAGNOSIS — N189 Chronic kidney disease, unspecified: Secondary | ICD-10-CM

## 2014-06-09 DIAGNOSIS — C9000 Multiple myeloma not having achieved remission: Secondary | ICD-10-CM

## 2014-06-09 DIAGNOSIS — R609 Edema, unspecified: Secondary | ICD-10-CM | POA: Diagnosis not present

## 2014-06-09 DIAGNOSIS — D649 Anemia, unspecified: Secondary | ICD-10-CM

## 2014-06-09 LAB — CBC WITH DIFFERENTIAL/PLATELET
BASO%: 0.5 % (ref 0.0–2.0)
BASOS ABS: 0 10*3/uL (ref 0.0–0.1)
EOS ABS: 0 10*3/uL (ref 0.0–0.5)
EOS%: 1.9 % (ref 0.0–7.0)
HCT: 29.9 % — ABNORMAL LOW (ref 34.8–46.6)
HEMOGLOBIN: 9.3 g/dL — AB (ref 11.6–15.9)
LYMPH%: 49.3 % (ref 14.0–49.7)
MCH: 32.9 pg (ref 25.1–34.0)
MCHC: 31.1 g/dL — ABNORMAL LOW (ref 31.5–36.0)
MCV: 105.7 fL — AB (ref 79.5–101.0)
MONO#: 0.3 10*3/uL (ref 0.1–0.9)
MONO%: 15.9 % — AB (ref 0.0–14.0)
NEUT#: 0.7 10*3/uL — ABNORMAL LOW (ref 1.5–6.5)
NEUT%: 32.4 % — ABNORMAL LOW (ref 38.4–76.8)
Platelets: 142 10*3/uL — ABNORMAL LOW (ref 145–400)
RBC: 2.83 10*6/uL — ABNORMAL LOW (ref 3.70–5.45)
RDW: 19.9 % — ABNORMAL HIGH (ref 11.2–14.5)
WBC: 2.1 10*3/uL — ABNORMAL LOW (ref 3.9–10.3)
lymph#: 1 10*3/uL (ref 0.9–3.3)

## 2014-06-09 LAB — BASIC METABOLIC PANEL (CC13)
Anion Gap: 9 mEq/L (ref 3–11)
BUN: 32.4 mg/dL — AB (ref 7.0–26.0)
CALCIUM: 9 mg/dL (ref 8.4–10.4)
CO2: 23 mEq/L (ref 22–29)
Chloride: 101 mEq/L (ref 98–109)
Creatinine: 7.1 mg/dL (ref 0.6–1.1)
GLUCOSE: 78 mg/dL (ref 70–140)
POTASSIUM: 4.7 meq/L (ref 3.5–5.1)
Sodium: 133 mEq/L — ABNORMAL LOW (ref 136–145)

## 2014-06-09 MED ORDER — FILGRASTIM 480 MCG/0.8ML IJ SOLN
480.0000 ug | Freq: Once | INTRAMUSCULAR | Status: DC
  Filled 2014-06-09: qty 0.8

## 2014-06-09 MED ORDER — TBO-FILGRASTIM 480 MCG/0.8ML ~~LOC~~ SOSY
480.0000 ug | PREFILLED_SYRINGE | Freq: Once | SUBCUTANEOUS | Status: AC
  Administered 2014-06-09: 480 ug via SUBCUTANEOUS
  Filled 2014-06-09: qty 0.8

## 2014-06-09 MED ORDER — FUROSEMIDE 20 MG PO TABS: 20.0000 mg | ORAL_TABLET | Freq: Every day | ORAL | Status: DC

## 2014-06-09 MED ORDER — LENALIDOMIDE 2.5 MG PO CAPS: 2.5000 mg | ORAL_CAPSULE | Freq: Every day | ORAL | Status: DC

## 2014-06-09 NOTE — Progress Notes (Signed)
[place] OFFICE PROGRESS NOTE  No PCP Per Patient No address on file  DIAGNOSIS: Multiple Myeloma.   Chief Complaint  Patient presents with  . Follow-up    CURRENT TREATMENT:  Carfilzomib 15 mg/m2 on days 1,3, 8, 10,15, 17 (her dialysis is on day 2); Dexamethasone 12 mg weekly on days 1, 8, 15, 22; revlimid 5 mg daily for 28 days.    Carfilzomib started on 03/29/2014.  She received it on 6/29 and 03/31/2014; Doses on 7/6 and 7/8 were held due to severe anemia. 7/13 and 7/15 she got it so 4 doses for 1st cycle.  Cycle 2 started 7/27 and 7/29, 8/5, 8/10 and 8/12 so she got 5 doses. With cycle 3 she got doses on 8/24 and 8/26. 8/31 Chemo held low ANC 500, Today we did not do chemo because ANC 700. She got 1 dose of neupogen 480 mcg sq today. She will continue the weekly Dexamethasone and Revlimid at 5 mg daily.      Multiple myeloma   03/30/2008 Initial Diagnosis Multiple myeloma   03/30/2008 - 05/01/2012 Chemotherapy Revlimid/Dex then Velcade (started on 07/13/2011 ending on 09/14/2011) without response.  Managed by Dr. Chauncey Fischer of [place], [place] [address]   07/24/2010 -  Chemotherapy Received zometa  intermittently over one year.  Creatinine was 1.0 on 07/24/2010. 1.7 on 08/10/2011.    05/01/2012 - 09/01/2012 Chemotherapy Starte Pomalidomide s/p 4 cycles. Referred to [facility] for consideration of ASCT.     10/08/2012 Bone Marrow Biopsy Normocellular marrow with residual/recurrently plasma cell myeloma, 60-70 % of overall marrow celluarity.  Flow: c/w plasma cell neoplasm, plasma cell infiltrate comprises 60-70 % of overall marrow cellularity; FISH: + for 1 q21/CKS1B gain; RB1 (13q14)   10/08/2012 Bone Marrow Biopsy Continued... Cytogenetics showed normal female karyotype.    10/29/2012 Tumor Marker High IgG (6019), high lamda free light chain (409.32) with low IgA, IgM and kappa free light chain.    04/21/2013 Imaging Skeletal survey.  No suspcious lytic or blastic lesions  visualized.  Severe multilevel degenerative disc disease in the cervical spine.    04/29/2013 Imaging PeT. No discrete suspicious focus of FDG uptake identified.    05/20/2013 - 06/09/2013 Chemotherapy Started carfilxomib 39 mg on 08/25, 08/26, day 8 (06/02/13), day 9 on 06/03/2013 and on 06/09/2013.     12/03/2013 Treatment Plan Change Last office visit with Dr. Glenna Durand.  She decline further chemotherapy.   [name]    03/11/2014 - 03/17/2014 Hospital Admission Admitted for low hemoglobin. Dr. Burney Gauze consulted.  SPEP,  UPEP and Bone marrow biopsy obtained.    IgG 11,300. M-spike 7.52, kappa lamda ratio 0 with lambda free light chains totaling 1260.    03/12/2014 Imaging Skeletal survey. 1. Small lytic lesions within the skull and right scapula. 2. Degenerative changes in the spine and knees, right greater than left. 3. Cardiomegaly without pulmonary edema.   03/17/2014 Bone Marrow Biopsy Hypercellular bone marrow tih extensive involvment by plasma cell neoplasm (Plasma cells 85%). FISH: Presence of 2 copies of the IGH/FGFR3, ATM, CCND1/IGH, and p53 probes.  Loss of 13q34 probes in 40% of cells. gain of chromose 12 and either loss of chorm   03/25/2014 -  Chemotherapy Planning Kyprolis 15 mg/m2 plus dexamethasone weekly plus revlimid (70m) daily on 06/29.     INTERVAL HISTORY: Carrie Lybrand73y.o. 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 She was initially consulted by [place] on 03/17/14. She was last seen Dr Elson Areas on 05/12/2014.  Today,she reports feeling much better with improved energy.  She reports seeing dentistry and completing a course of antibiotics with resolution of her dental pain.  She denies fevers or chills. She had a recent hospitalization (6/11 - 03/17/2014) due to profound anemia. She was transfused packed RBCs doing  this admission.   She complains of swelling in her ankles. She undergoes dialysis every Tuesday, Thursday and Sat. Her revlimid dose was reduced 50%.  She started it but I held last week but I asked patient to resume it today. I gave her a script for Lasix today 20 mg prn. We rechecked her BP and her diastolic BP was in 80 on repeat check. She gets dialysis on Tuesday, Thursday and Saturday schedule.  MEDICAL HISTORY: Past Medical History  Diagnosis Date  . Renal disorder   . ESRD (end stage renal disease)   . Hypertension   . Multiple myeloma 2009  . Thyroid disease   . Hyperparathyroidism   . Thrombocytopenia   . Anemia   . Shortness of breath   . Pneumonia   . GERD (gastroesophageal reflux disease)   . Arthritis     rt knee is stiff    INTERIM HISTORY: has BRBPR (bright red blood per rectum); Acute blood loss anemia; ESRD (end stage renal disease); Multiple myeloma; Pancytopenia; and HTN (hypertension) on her problem list.    ALLERGIES:  has No Known Allergies.  MEDICATIONS: has a current medication list which includes the following prescription(s): acetaminophen-codeine, acyclovir, amlodipine, aspirin ec, cyproheptadine, dexamethasone, furosemide, hydralazine, hydrocodone-homatropine, isosorbide mononitrate, lenalidomide, lorazepam, metolazone, multiple vitamins-iron, ondansetron, oxybutynin, prochlorperazine, sevelamer carbonate, and nifedipine.  SURGICAL HISTORY:  Past Surgical History  Procedure Laterality Date  . Abdominal hysterectomy      Pt. denies    REVIEW OF SYSTEMS:   Constitutional: Denies fevers, chills or abnormal weight loss. States that they pulled too much fluid off her during dialysis. Eyes: Denies blurriness of vision Ears, nose, mouth, throat, and face: Denies mucositis or sore throat Respiratory: Denies cough, dyspnea or wheezes Cardiovascular: Denies palpitation, chest discomfort or lower extremity swelling Gastrointestinal:  Denies nausea, heartburn  or change in bowel habits Skin: Denies abnormal skin rashes Lymphatics: Denies new lymphadenopathy or easy bruising Neurological:Denies numbness, tingling or new weaknesses Behavioral/Psych: Mood is stable, no new changes  All other systems were reviewed with the patient and are negative.  PHYSICAL EXAMINATION: ECOG PERFORMANCE STATUS: 1  Blood pressure 127/80, pulse 52, temperature 98.4 F (36.9 C), temperature source Oral, resp. rate 19, height _0  (1.702 m), weight 138 lb 9.6 oz (62.869 kg).  GENERAL:alert, no distress and comfortable; well developed and well nourished. Anxious SKIN: skin color, texture, turgor are normal, no rashes or significant lesions EYES: normal, Conjunctiva are pink and non-injected, sclera clear OROPHARYNX:no exudate, no erythema and lips, buccal mucosa, and tongue normal  NECK: supple, thyroid normal size, non-tender, without nodularity LYMPH:  no palpable lymphadenopathy in the cervical, axillary or supraclavicular LUNGS: clear to auscultation with normal breathing effort, no wheezes or rhonchi HEART: regular rate & rhythm and no murmurs and 1+ edema in ankles bilaterally; L AVF ABDOMEN:abdomen soft, non-tender and normal bowel sounds Musculoskeletal:no cyanosis of digits and no clubbing  NEURO: alert & oriented x 3 with fluent speech, no focal motor/sensory deficits  Labs:  RADIOGRAPHIC STUDIES: Ct Biopsy  03/17/2014   CLINICAL DATA:  73 year old with history of myeloma and anemia.  EXAM: CT GUIDED BONE MARROW ASPIRATES AND BIOPSY  Physician: Stephan Minister. Henn, MD  MEDICATIONS: 2 mg Versed, 50 mcg fentanyl. A radiology nurse monitored the patient for moderate sedation.  ANESTHESIA/SEDATION: Sedation time: 10 min  PROCEDURE: The procedure was explained to the patient. The risks and benefits of the procedure were discussed and the patient's questions were addressed. Informed consent was obtained from the patient. The patient was placed prone on CT  scan. Images of the pelvis were obtained. The right side of back was prepped and draped in sterile fashion. The skin and right posterior iliac bone were anesthetized with 1% lidocaine. 11 gauge bone needle was directed into the right iliac bone with CT guidance. Two aspirates and one core biopsy obtained.  FINDINGS: Needle directed into the right iliac bone. Subtle areas of lucency throughout the pelvic bones. Findings are consistent with history of myeloma.  COMPLICATIONS: None  IMPRESSION: CT guided bone marrow aspirates and core biopsy.   Electronically Signed   By: Markus Daft M.D.   On: 03/17/2014 13:07   Dg Bone Survey Met  03/12/2014   CLINICAL DATA:  Myeloma.  Right knee pain.  EXAM: METASTATIC BONE SURVEY  COMPARISON:  None.  FINDINGS: Multiple images are performed of the axial and appendicular skeleton.  Multiple small lytic lesions are identified within the skull consistent with myeloma. A small lytic lesion is identified within the right scapula.  Degenerative changes are identified within the mid cervical spine. No suspicious lytic or blastic lesions are identified within the spine, or long bones. Surgical clips are identified in the left upper arm. Degenerative changes are seen in the knees bilaterally, right greater than left.  The heart is mildly enlarged. There are areas of atelectasis within the right lung. No focal consolidations or pulmonary edema.  IMPRESSION: 1. Small lytic lesions within the skull and right scapula. 2. Degenerative changes in the spine and knees, right greater than left. 3. Cardiomegaly without pulmonary edema.   Electronically Signed   By: Shon Hale M.D.   On: 03/12/2014 10:13     ASSESSMENT: [name] 73 y.o. female with a history of Multiple Myeloma and ESRD on HD currently on a modified regimen of Dexamethasone, Revlimid and Carfilzomib.  PLAN:   1. IgG Lambda MM, rapidly progressive.  --On prior visits, I reviewed extensively her medical records from  [place], [place] and her recent work out. Her Bone marrow is packed with 85% plasma cells likely explaining her refractory anemia and pancytopenia. Her protein was 14.7 down to 12.5 now 10.3 since starting chemotherapy. Her case has been difficult due to a history of non-compliance. She stated that she stopped her recent treatment in [place] due to being hospitalized. Per records from her primary oncologist's office, it was likely due to her kidney failure due to progression of her multiple myeloma. Her last treatment is as noted above. Considering she received only one cycle of carfilzomib plus dexamethasone, we started her back on this regiment based on Nicole Kindred et. Al, NEJM, 2015).   *Carfilzomib (15 mg/m2) on days 1, 3, 8, 10, 15, 17  *Lenalidomide 25m daily (please take follow H/D on Tue/th/Sa). --dose reduced by 50% due to delayed count recovery.  *Dexamethasone 12 mg weekly  We presented the treatment indications, benefits and side-effects including but not limited to myelosuprresion which can result in life-threatening infections and anemia and low  plts, and nausea or vomiting or rashes. Continue acyclovir for antiviral prophylaxis.  She understood the indications, risks and benefits and chose to proceed. She started therapy on 03/29/2014. She received 4 total doses of carfilzomib for cycle #1 and a total of 5 doses for cycle #2 ( including the one scheduled for today).  She will resume Kyprolis on 08/24 with cycle #3, day #1.  We will continue to follow labs weekly starting on 08/24.   She was instructed to resume her revlimid.   We will obtain Multiple myeloma markers monthly starting on 08/24.  Markers from last month revealed a decrease in her M-spike. Her IgG level on 05/24/2014 was down to 4.8 grams (4820 mg) from 11.3 grams (11300 mg) in June 2015.  2. Pancytopenia with anemia likely multifactorial.  --Secondary to #1. We will start on aranesp biweekly if not provided with her H/D.  She cannot have  transfusions on the days of her dialysis (Tues, Thurs and Sat). Her ANC is 700. She was advised extensively on symptoms of Neutropenia.  Her hemoglobin is 9.3.   Her plts are up to 142.  3. ESRDz.  --Likely secondary to #1. Continue hemodialysis on Tues, Thursday and Sat.   4. Psuedohyponatremia.  --Likely due to #1. Treat underlying disorder. Her sodium is up to 133.   5. History of Noncompliance.  -- Ongoing problem but we are trying to work with her.  6. Lower extremity swelling. --.  May require additional fluid pulled off with h/d. Lasix prn.  7. Poor nutrition. --She has had dental pain with resolution which may in part explain some weight lost.  We will refer to nutrition with consideration for nutritional supplementation with boost and/or ensure.   8. Follow up.  --Patient will follow up in our office in 1 week so we can resume her Carfilzomib upon white count recovery.   All questions were answered. The patient knows to call the clinic with any problems, questions or concerns. We can certainly see the patient much sooner if necessary.  I spent 25 minutes counseling the patient face to face. The total time spent in the appointment was 30 minutes.    Bernadene Bell, MD Medical Hematologist/Oncologist [place] Pager: [phone] Office No: [phone]

## 2014-06-09 NOTE — Telephone Encounter (Signed)
Per staff message and POF I have scheduled appts. Advised scheduler of appts. JMW  

## 2014-06-09 NOTE — Telephone Encounter (Signed)
Pt confirmed labs/ov per 09/09 POF, sent msg to add chemo, gave pt AVS......KJ °

## 2014-06-10 ENCOUNTER — Telehealth: Payer: Self-pay | Admitting: *Deleted

## 2014-06-10 NOTE — Telephone Encounter (Signed)
Per POF I have changed appts. Scheduler  Advised

## 2014-06-14 ENCOUNTER — Telehealth: Payer: Self-pay | Admitting: Hematology

## 2014-06-14 ENCOUNTER — Other Ambulatory Visit: Payer: Self-pay | Admitting: *Deleted

## 2014-06-14 ENCOUNTER — Ambulatory Visit (HOSPITAL_BASED_OUTPATIENT_CLINIC_OR_DEPARTMENT_OTHER): Payer: Medicare Other | Admitting: Hematology

## 2014-06-14 ENCOUNTER — Other Ambulatory Visit (HOSPITAL_BASED_OUTPATIENT_CLINIC_OR_DEPARTMENT_OTHER): Payer: Medicare Other

## 2014-06-14 ENCOUNTER — Ambulatory Visit: Payer: Self-pay

## 2014-06-14 VITALS — BP 125/63 | HR 58 | Temp 98.6°F | Resp 18 | Ht 67.0 in | Wt 147.3 lb

## 2014-06-14 DIAGNOSIS — M7989 Other specified soft tissue disorders: Secondary | ICD-10-CM | POA: Diagnosis not present

## 2014-06-14 DIAGNOSIS — N186 End stage renal disease: Secondary | ICD-10-CM

## 2014-06-14 DIAGNOSIS — C9000 Multiple myeloma not having achieved remission: Secondary | ICD-10-CM | POA: Diagnosis present

## 2014-06-14 DIAGNOSIS — D61818 Other pancytopenia: Secondary | ICD-10-CM

## 2014-06-14 DIAGNOSIS — Z5189 Encounter for other specified aftercare: Secondary | ICD-10-CM

## 2014-06-14 LAB — CBC WITH DIFFERENTIAL/PLATELET
BASO%: 0.4 % (ref 0.0–2.0)
Basophils Absolute: 0 10*3/uL (ref 0.0–0.1)
EOS%: 1.8 % (ref 0.0–7.0)
Eosinophils Absolute: 0 10*3/uL (ref 0.0–0.5)
HEMATOCRIT: 28.5 % — AB (ref 34.8–46.6)
HGB: 9.1 g/dL — ABNORMAL LOW (ref 11.6–15.9)
LYMPH%: 55.5 % — ABNORMAL HIGH (ref 14.0–49.7)
MCH: 33 pg (ref 25.1–34.0)
MCHC: 31.9 g/dL (ref 31.5–36.0)
MCV: 103.3 fL — ABNORMAL HIGH (ref 79.5–101.0)
MONO#: 0.2 10*3/uL (ref 0.1–0.9)
MONO%: 10.1 % (ref 0.0–14.0)
NEUT#: 0.7 10*3/uL — ABNORMAL LOW (ref 1.5–6.5)
NEUT%: 32.2 % — AB (ref 38.4–76.8)
Platelets: 144 10*3/uL — ABNORMAL LOW (ref 145–400)
RBC: 2.76 10*6/uL — ABNORMAL LOW (ref 3.70–5.45)
RDW: 19.5 % — ABNORMAL HIGH (ref 11.2–14.5)
WBC: 2.3 10*3/uL — ABNORMAL LOW (ref 3.9–10.3)
lymph#: 1.3 10*3/uL (ref 0.9–3.3)
nRBC: 0 % (ref 0–0)

## 2014-06-14 MED ORDER — TBO-FILGRASTIM 480 MCG/0.8ML ~~LOC~~ SOSY
480.0000 ug | PREFILLED_SYRINGE | Freq: Once | SUBCUTANEOUS | Status: AC
  Administered 2014-06-14: 480 ug via SUBCUTANEOUS
  Filled 2014-06-14: qty 0.8

## 2014-06-14 MED ORDER — DEXAMETHASONE 4 MG PO TABS: ORAL_TABLET | ORAL | Status: DC

## 2014-06-14 NOTE — Telephone Encounter (Signed)
gva dn pritned appt sched and avs for pt for Sept.. °

## 2014-06-14 NOTE — Progress Notes (Signed)
[facility] PROGRESS NOTE  No PCP Per Patient No address on file  DIAGNOSIS: Multiple Myeloma for follow up, ESRD on HD T/T/S schedule.  CURRENT TREATMENT:  Carfilzomib 15 mg/m2 on days 1,3, 8, 10,15, 17 (her dialysis is on day 2); Dexamethasone 12 mg weekly on days 1, 8, 15, 22; revlimid 5 mg daily for 28 days.    Carfilzomib started on 03/29/2014.  She received it on 6/29 and 03/31/2014; Doses on 7/6 and 7/8 were held due to severe anemia. 7/13 and 7/15 she got it so 4 doses for 1st cycle.  Cycle 2 started 7/27 and 7/29, 8/5, 8/10 and 8/12 so she got 5 doses. With cycle 3 she got doses on 8/24 and 8/26. 8/31 Chemo held low ANC 500, on 06/09/14 we did not do chemo because ANC 700. She got 1 dose of neupogen 480 mcg sq. She continued the weekly Dexamethasone and Revlimid at 5 mg daily. Labs today show persistent neutropenia so I am going to hold Revlimid also as of today (06/14/14) and delay chemo for another week.      Multiple myeloma   03/30/2008 Initial Diagnosis Multiple myeloma   03/30/2008 - 05/01/2012 Chemotherapy Revlimid/Dex then Velcade (started on 07/13/2011 ending on 09/14/2011) without response.  Managed by Dr. Chauncey Fischer of [place], [place] [address]   07/24/2010 -  Chemotherapy Received zometa  intermittently over one year.  Creatinine was 1.0 on 07/24/2010. 1.7 on 08/10/2011.    05/01/2012 - 09/01/2012 Chemotherapy Starte Pomalidomide s/p 4 cycles. Referred to [facility] for consideration of ASCT.     10/08/2012 Bone Marrow Biopsy Normocellular marrow with residual/recurrently plasma cell myeloma, 60-70 % of overall marrow celluarity.  Flow: c/w plasma cell neoplasm, plasma cell infiltrate comprises 60-70 % of overall marrow cellularity; FISH: + for 1 q21/CKS1B gain; RB1 (13q14)   10/08/2012 Bone Marrow Biopsy Continued... Cytogenetics showed normal female karyotype.    10/29/2012 Tumor Marker High IgG (6019), high lamda free light chain (409.32) with low  IgA, IgM and kappa free light chain.    04/21/2013 Imaging Skeletal survey.  No suspcious lytic or blastic lesions visualized.  Severe multilevel degenerative disc disease in the cervical spine.    04/29/2013 Imaging PeT. No discrete suspicious focus of FDG uptake identified.    05/20/2013 - 06/09/2013 Chemotherapy Started carfilxomib 39 mg on 08/25, 08/26, day 8 (06/02/13), day 9 on 06/03/2013 and on 06/09/2013.     12/03/2013 Treatment Plan Change Last office visit with Dr. Glenna Durand.  She decline further chemotherapy.   [name]    03/11/2014 - 03/17/2014 Hospital Admission Admitted for low hemoglobin. Dr. Burney Gauze consulted.  SPEP,  UPEP and Bone marrow biopsy obtained.    IgG 11,300. M-spike 7.52, kappa lamda ratio 0 with lambda free light chains totaling 1260.    03/12/2014 Imaging Skeletal survey. 1. Small lytic lesions within the skull and right scapula. 2. Degenerative changes in the spine and knees, right greater than left. 3. Cardiomegaly without pulmonary edema.   03/17/2014 Bone Marrow Biopsy Hypercellular bone marrow tih extensive involvment by plasma cell neoplasm (Plasma cells 85%). FISH: Presence of 2 copies of the IGH/FGFR3, ATM, CCND1/IGH, and p53 probes.  Loss of 13q34 probes in 40% of cells. gain of chromose 12 and either loss of chorm   03/25/2014 -  Chemotherapy Planning Kyprolis 15 mg/m2 plus dexamethasone weekly plus revlimid (24m) daily on 06/29.  06/14/2014 -  Chemotherapy Patient's last dose of Kyprolis was on 05/26/14. Since then chemo held due to neutropenia despite GCSF support. I will hold Revlimid also as of 06/14/14     INTERVAL HISTORY:  [name] 73 y.o. female with a history of IgG Lambda Multiple myeloma complicated by non-compliance, ESRDz on H/D(Tues, Thursday and Sat) is here for follow up. She was initially consulted by [place] on 03/17/14. She was last seen by me on 06/09/14.  Today,she reports  feeling much better with improved energy.  She reports seeing dentistry and completing a course of antibiotics with resolution of her dental pain.  She denies fevers or chills. She had a recent hospitalization (6/11 - 03/17/2014) due to profound anemia. She was transfused packed RBCs doing this admission.   She complains of swelling in her ankles. She undergoes dialysis every Tuesday, Thursday and Sat. Her revlimid dose was reduced 50%.  No new problems. She has been taking dexamethasone weekly and revlimid at 5 mg daily dose. i am going to hold revlimid as of today. Her labs indicated following:          MEDICAL HISTORY: Past Medical History  Diagnosis Date  . Renal disorder   . ESRD (end stage renal disease)   . Hypertension   . Multiple myeloma 2009  . Thyroid disease   . Hyperparathyroidism   . Thrombocytopenia   . Anemia   . Shortness of breath   . Pneumonia   . GERD (gastroesophageal reflux disease)   . 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    ALLERGIES:  has No Known Allergies.  MEDICATIONS: has a current medication list which includes the following prescription(s): acetaminophen-codeine, acyclovir, amlodipine, aspirin ec, cyproheptadine, furosemide, hydralazine, hydrocodone-homatropine, isosorbide mononitrate, lenalidomide, lorazepam, metolazone, multiple vitamins-iron, nifedipine, ondansetron, oxybutynin, prochlorperazine, sevelamer carbonate, and dexamethasone.  SURGICAL HISTORY:  Past Surgical History  Procedure Laterality Date  . Abdominal hysterectomy      Pt. denies    REVIEW OF SYSTEMS:   Constitutional: Denies fevers, chills or abnormal weight loss. States that they pulled too much fluid off her during dialysis. Eyes: Denies blurriness of  vision Ears, nose, mouth, throat, and face: Denies mucositis or sore throat Respiratory: Denies cough, dyspnea or wheezes Cardiovascular: Denies palpitation, chest discomfort or lower extremity swelling Gastrointestinal:  Denies nausea, heartburn or change in bowel habits Skin: Denies abnormal skin rashes Lymphatics: Denies new lymphadenopathy or easy bruising Neurological:Denies numbness, tingling or new weaknesses Behavioral/Psych: Mood is stable, no new changes  All other systems were reviewed with the patient and are negative.  PHYSICAL EXAMINATION: ECOG PERFORMANCE STATUS: 1  Blood pressure 125/63, pulse 58, temperature 98.6 F (37 C), temperature source Oral, resp. rate 18, height '5\' 7"'  (1.702 m), weight 147 lb 4.8 oz (66.815 kg), SpO2 100.00%.  GENERAL:alert, no distress and comfortable; well developed and well nourished. Anxious SKIN: skin color, texture, turgor are normal, no rashes or significant lesions EYES: normal, Conjunctiva are pink and non-injected, sclera clear OROPHARYNX:no exudate, no erythema and lips, buccal mucosa, and tongue normal  NECK: supple, thyroid normal size, non-tender, without nodularity LYMPH:  no palpable lymphadenopathy in the cervical, axillary or supraclavicular LUNGS: clear to auscultation with normal breathing effort, no wheezes or rhonchi HEART: regular rate & rhythm and no murmurs and  1+ edema in ankles bilaterally; L AVF ABDOMEN:abdomen soft, non-tender and normal bowel sounds Musculoskeletal:no cyanosis of digits and no clubbing  NEURO: alert & oriented x 3 with fluent speech, no focal motor/sensory deficits   RADIOGRAPHIC STUDIES: Ct Biopsy  03/17/2014   CLINICAL DATA:  73 year old with history of myeloma and anemia.  EXAM: CT GUIDED BONE MARROW ASPIRATES AND BIOPSY  Physician: Stephan Minister. Henn, MD  MEDICATIONS: 2 mg Versed, 50 mcg fentanyl. A radiology nurse monitored the patient for moderate sedation.  ANESTHESIA/SEDATION: Sedation time: 10  min  PROCEDURE: The procedure was explained to the patient. The risks and benefits of the procedure were discussed and the patient's questions were addressed. Informed consent was obtained from the patient. The patient was placed prone on CT scan. Images of the pelvis were obtained. The right side of back was prepped and draped in sterile fashion. The skin and right posterior iliac bone were anesthetized with 1% lidocaine. 11 gauge bone needle was directed into the right iliac bone with CT guidance. Two aspirates and one core biopsy obtained.  FINDINGS: Needle directed into the right iliac bone. Subtle areas of lucency throughout the pelvic bones. Findings are consistent with history of myeloma.  COMPLICATIONS: None  IMPRESSION: CT guided bone marrow aspirates and core biopsy.   Electronically Signed   By: Markus Daft M.D.   On: 03/17/2014 13:07   Dg Bone Survey Met  03/12/2014   CLINICAL DATA:  Myeloma.  Right knee pain.  EXAM: METASTATIC BONE SURVEY  COMPARISON:  None.  FINDINGS: Multiple images are performed of the axial and appendicular skeleton.  Multiple small lytic lesions are identified within the skull consistent with myeloma. A small lytic lesion is identified within the right scapula.  Degenerative changes are identified within the mid cervical spine. No suspicious lytic or blastic lesions are identified within the spine, or long bones. Surgical clips are identified in the left upper arm. Degenerative changes are seen in the knees bilaterally, right greater than left.  The heart is mildly enlarged. There are areas of atelectasis within the right lung. No focal consolidations or pulmonary edema.  IMPRESSION: 1. Small lytic lesions within the skull and right scapula. 2. Degenerative changes in the spine and knees, right greater than left. 3. Cardiomegaly without pulmonary edema.   Electronically Signed   By: Shon Hale M.D.   On: 03/12/2014 10:13     ASSESSMENT: [name] 73 y.o. female with a  history of Multiple Myeloma and ESRD on HD currently on a modified regimen of Dexamethasone, Revlimid and Carfilzomib. Chemo again held due to neutropenia and I am giving her 2 doses of Granix on 9/14 and 06/16/14. This is because she has transport issues and on T/T/S she goes for hemodialysis.   PLAN:   1. IgG Lambda MM, rapidly progressive.  --On prior visits, I reviewed extensively her medical records from [place], [place] and her recent work out. Her Bone marrow is packed with 85% plasma cells likely explaining her refractory anemia and pancytopenia. Her protein was 14.7 down to 12.5 now 10.3 since starting chemotherapy. Her case has been difficult due to a history of non-compliance. She stated that she stopped her recent treatment in [place] due to being hospitalized. Per records from her primary oncologist's office, it was likely due to her kidney failure due to progression of her multiple myeloma. Her last treatment is as noted above. Considering she received only one cycle of carfilzomib plus dexamethasone, we started her back on  this regiment based on Nicole Kindred et. Al, NEJM, 2015).   *Carfilzomib (15 mg/m2) on days 1, 3, 8, 10, 15, 17  *Lenalidomide 27m daily (please take follow H/D on Tue/th/Sa). --dose reduced by 50% due to delayed count recovery. Revlimid is now being held as of today 06/14/14 *Dexamethasone 12 mg weekly  We presented the treatment indications, benefits and side-effects including but not limited to myelosuprresion which can result in life-threatening infections and anemia and low plts, and nausea or vomiting or rashes. Continue acyclovir for antiviral prophylaxis.  She understood the indications, risks and benefits and chose to proceed. She started therapy on 03/29/2014. She received 4 total doses of carfilzomib for cycle #1 and a total of 5 doses for cycle #2 ( including the one scheduled for today).  She will resume Kyprolis on 08/24 with cycle #3, day #1.  We will continue to follow  labs weekly starting on 08/24.   She was instructed to resume her revlimid.   We will obtain Multiple myeloma markers monthly starting on 08/24.  Markers from last month revealed a decrease in her M-spike. Her IgG level on 05/24/2014 was down to 4.8 grams (4820 mg) from 11.3 grams (11300 mg) in June 2015.  2. Pancytopenia with anemia likely multifactorial.  --Secondary to #1. We will start on aranesp biweekly if not provided with her H/D.  She cannot have transfusions on the days of her dialysis (Tues, Thurs and Sat). Her ANC is 700. She was advised extensively on symptoms of Neutropenia.  Her hemoglobin is 9.1. Her plts are up to 144.  3. ESRDz.  --Likely secondary to #1. Continue hemodialysis on Tues, Thursday and Sat.   4. Psuedohyponatremia.  --Likely due to #1. Treat underlying disorder. Her sodium is up to 133.   5. History of Noncompliance.  -- Ongoing problem but we are trying to work with her.  6. Lower extremity swelling. --.  May require additional fluid pulled off with h/d. Lasix prn.  7. Poor nutrition. --She has had dental pain with resolution which may in part explain some weight lost.  We will refer to nutrition with consideration for nutritional supplementation with boost and/or ensure.   8. Follow up.  --Patient will follow up in our office in 1 week so we can resume her Carfilzomib upon white count recovery and that would be on 06/21/14.  All questions were answered. The patient knows to call the clinic with any problems, questions or concerns. We can certainly see the patient much sooner if necessary.  I spent 20 minutes counseling the patient face to face. The total time spent in the appointment was 25 minutes.    ABernadene Bell MD Medical Hematologist/Oncologist CMercerPager: [phone] No: [phone]

## 2014-06-15 ENCOUNTER — Telehealth: Payer: Self-pay | Admitting: Hematology

## 2014-06-15 NOTE — Telephone Encounter (Signed)
per pof dates were off a little-cld & spoke w/[name] AS nurse veri she gave pt inj 9/14 and pt will come again ob 9/16-pt aware of inj app

## 2014-06-16 ENCOUNTER — Ambulatory Visit: Payer: Self-pay

## 2014-06-16 ENCOUNTER — Ambulatory Visit (HOSPITAL_BASED_OUTPATIENT_CLINIC_OR_DEPARTMENT_OTHER): Payer: Medicare Other

## 2014-06-16 ENCOUNTER — Other Ambulatory Visit: Payer: Self-pay | Admitting: Hematology

## 2014-06-16 VITALS — BP 129/72 | HR 62 | Temp 98.1°F

## 2014-06-16 DIAGNOSIS — D61818 Other pancytopenia: Secondary | ICD-10-CM

## 2014-06-16 DIAGNOSIS — Z5189 Encounter for other specified aftercare: Secondary | ICD-10-CM

## 2014-06-16 DIAGNOSIS — C9000 Multiple myeloma not having achieved remission: Secondary | ICD-10-CM

## 2014-06-16 MED ORDER — TBO-FILGRASTIM 480 MCG/0.8ML ~~LOC~~ SOSY
480.0000 ug | PREFILLED_SYRINGE | Freq: Once | SUBCUTANEOUS | Status: AC
  Administered 2014-06-16: 480 ug via SUBCUTANEOUS
  Filled 2014-06-16: qty 0.8

## 2014-06-21 ENCOUNTER — Other Ambulatory Visit (HOSPITAL_BASED_OUTPATIENT_CLINIC_OR_DEPARTMENT_OTHER): Payer: Medicare Other

## 2014-06-21 ENCOUNTER — Ambulatory Visit (HOSPITAL_BASED_OUTPATIENT_CLINIC_OR_DEPARTMENT_OTHER): Payer: Medicare Other | Admitting: Hematology

## 2014-06-21 ENCOUNTER — Telehealth: Payer: Self-pay | Admitting: Hematology

## 2014-06-21 ENCOUNTER — Other Ambulatory Visit: Payer: Self-pay | Admitting: *Deleted

## 2014-06-21 ENCOUNTER — Ambulatory Visit: Payer: Self-pay

## 2014-06-21 VITALS — BP 133/62 | HR 52 | Temp 98.2°F | Resp 18 | Ht 67.0 in | Wt 138.5 lb

## 2014-06-21 DIAGNOSIS — Z5189 Encounter for other specified aftercare: Secondary | ICD-10-CM | POA: Diagnosis present

## 2014-06-21 DIAGNOSIS — N186 End stage renal disease: Secondary | ICD-10-CM

## 2014-06-21 DIAGNOSIS — R634 Abnormal weight loss: Secondary | ICD-10-CM | POA: Diagnosis not present

## 2014-06-21 DIAGNOSIS — Z992 Dependence on renal dialysis: Secondary | ICD-10-CM | POA: Diagnosis not present

## 2014-06-21 DIAGNOSIS — C9000 Multiple myeloma not having achieved remission: Secondary | ICD-10-CM

## 2014-06-21 DIAGNOSIS — D61818 Other pancytopenia: Secondary | ICD-10-CM

## 2014-06-21 DIAGNOSIS — M7989 Other specified soft tissue disorders: Secondary | ICD-10-CM | POA: Diagnosis not present

## 2014-06-21 DIAGNOSIS — D649 Anemia, unspecified: Secondary | ICD-10-CM

## 2014-06-21 DIAGNOSIS — D702 Other drug-induced agranulocytosis: Secondary | ICD-10-CM

## 2014-06-21 DIAGNOSIS — E871 Hypo-osmolality and hyponatremia: Secondary | ICD-10-CM

## 2014-06-21 LAB — COMPREHENSIVE METABOLIC PANEL (CC13)
ALBUMIN: 3.1 g/dL — AB (ref 3.5–5.0)
ALT: 9 U/L (ref 0–55)
AST: 21 U/L (ref 5–34)
Alkaline Phosphatase: 52 U/L (ref 40–150)
Anion Gap: 14 mEq/L — ABNORMAL HIGH (ref 3–11)
BUN: 50.7 mg/dL — ABNORMAL HIGH (ref 7.0–26.0)
CALCIUM: 8.9 mg/dL (ref 8.4–10.4)
CHLORIDE: 101 meq/L (ref 98–109)
CO2: 19 mEq/L — ABNORMAL LOW (ref 22–29)
Creatinine: 9.2 mg/dL (ref 0.6–1.1)
Glucose: 87 mg/dl (ref 70–140)
POTASSIUM: 5.6 meq/L — AB (ref 3.5–5.1)
Sodium: 133 mEq/L — ABNORMAL LOW (ref 136–145)
Total Bilirubin: 0.42 mg/dL (ref 0.20–1.20)
Total Protein: 9.5 g/dL — ABNORMAL HIGH (ref 6.4–8.3)

## 2014-06-21 LAB — CBC WITH DIFFERENTIAL/PLATELET
BASO%: 0.8 % (ref 0.0–2.0)
BASOS ABS: 0 10*3/uL (ref 0.0–0.1)
EOS%: 2.2 % (ref 0.0–7.0)
Eosinophils Absolute: 0.1 10*3/uL (ref 0.0–0.5)
HCT: 32.9 % — ABNORMAL LOW (ref 34.8–46.6)
HGB: 10.3 g/dL — ABNORMAL LOW (ref 11.6–15.9)
LYMPH#: 1.3 10*3/uL (ref 0.9–3.3)
LYMPH%: 44.7 % (ref 14.0–49.7)
MCH: 33.5 pg (ref 25.1–34.0)
MCHC: 31.2 g/dL — AB (ref 31.5–36.0)
MCV: 107.5 fL — ABNORMAL HIGH (ref 79.5–101.0)
MONO#: 0.6 10*3/uL (ref 0.1–0.9)
MONO%: 20.8 % — ABNORMAL HIGH (ref 0.0–14.0)
NEUT#: 0.9 10*3/uL — ABNORMAL LOW (ref 1.5–6.5)
NEUT%: 31.5 % — ABNORMAL LOW (ref 38.4–76.8)
Platelets: 176 10*3/uL (ref 145–400)
RBC: 3.06 10*6/uL — ABNORMAL LOW (ref 3.70–5.45)
RDW: 19.9 % — ABNORMAL HIGH (ref 11.2–14.5)
WBC: 2.9 10*3/uL — ABNORMAL LOW (ref 3.9–10.3)

## 2014-06-21 MED ORDER — FILGRASTIM 480 MCG/0.8ML IJ SOLN: 480.0000 ug | Freq: Once | INTRAMUSCULAR | Status: DC

## 2014-06-21 MED ORDER — TBO-FILGRASTIM 480 MCG/0.8ML ~~LOC~~ SOSY
480.0000 ug | PREFILLED_SYRINGE | Freq: Once | SUBCUTANEOUS | Status: AC
  Administered 2014-06-21: 480 ug via SUBCUTANEOUS
  Filled 2014-06-21: qty 0.8

## 2014-06-21 NOTE — Patient Instructions (Signed)

## 2014-06-21 NOTE — Progress Notes (Signed)
[facility] PROGRESS NOTE   DIAGNOSIS: Multiple Myeloma for follow up, ESRD on HD T/T/S schedule.  CURRENT TREATMENT:  Carfilzomib 15 mg/m2 on days 1,3, 8, 10,15, 17 (her dialysis is on day 2); Dexamethasone 12 mg weekly on days 1, 8, 15, 22; revlimid 5 mg daily for 28 days.    Carfilzomib started on 03/29/2014.  She received it on 6/29 and 03/31/2014; Doses on 7/6 and 7/8 were held due to severe anemia. 7/13 and 7/15 she got it so 4 doses for 1st cycle.  Cycle 2 started 7/27 and 7/29, 8/5, 8/10 and 8/12 so she got 5 doses. With cycle 3 she got doses on 8/24 and 8/26. 8/31 Chemo held low ANC 500, on 06/09/14 we did not do chemo because ANC 700. She got 1 dose of neupogen 480 mcg sq. She continued the weekly Dexamethasone and Revlimid at 5 mg daily. Labs last week show persistent neutropenia so I am going held Revlimid also as of 06/14/14 and delayed chemo for another week. Today her WBC and ANC better but she is still not in safe range to treat her and I am delaying her treatment by another week. She will continue Dexamethasone but will hold Revlimid and Carfilzomib until count recovery.      Multiple myeloma   03/30/2008 Initial Diagnosis Multiple myeloma   03/30/2008 - 05/01/2012 Chemotherapy Revlimid/Dex then Velcade (started on 07/13/2011 ending on 09/14/2011) without response.  Managed by Dr. Chauncey Fischer of [place], [place] [address]   07/24/2010 -  Chemotherapy Received zometa  intermittently over one year.  Creatinine was 1.0 on 07/24/2010. 1.7 on 08/10/2011.    05/01/2012 - 09/01/2012 Chemotherapy Starte Pomalidomide s/p 4 cycles. Referred to [facility] for consideration of ASCT.     10/08/2012 Bone Marrow Biopsy Normocellular marrow with residual/recurrently plasma cell myeloma, 60-70 % of overall marrow celluarity.  Flow: c/w plasma cell neoplasm, plasma cell infiltrate comprises 60-70 % of overall marrow cellularity; FISH: + for 1 q21/CKS1B gain; RB1 (13q14)    10/08/2012 Bone Marrow Biopsy Continued... Cytogenetics showed normal female karyotype.    10/29/2012 Tumor Marker High IgG (6019), high lamda free light chain (409.32) with low IgA, IgM and kappa free light chain.    04/21/2013 Imaging Skeletal survey.  No suspcious lytic or blastic lesions visualized.  Severe multilevel degenerative disc disease in the cervical spine.    04/29/2013 Imaging PeT. No discrete suspicious focus of FDG uptake identified.    05/20/2013 - 06/09/2013 Chemotherapy Started carfilxomib 39 mg on 08/25, 08/26, day 8 (06/02/13), day 9 on 06/03/2013 and on 06/09/2013.     12/03/2013 Treatment Plan Change Last office visit with Dr. Glenna Durand.  She decline further chemotherapy.   Her son passed away and she relocated to [place] to stay with her daughter [name].    03/11/2014 - 03/17/2014 Hospital Admission Admitted for low hemoglobin. Dr. Burney Gauze consulted.  SPEP,  UPEP and Bone marrow biopsy obtained.    IgG 11,300. M-spike 7.52, kappa lamda ratio 0 with lambda free light chains totaling 1260.    03/12/2014 Imaging Skeletal survey. 1. Small lytic lesions within the skull and right scapula. 2. Degenerative changes in the spine and knees, right greater than left. 3. Cardiomegaly without pulmonary edema.   03/17/2014 Bone Marrow Biopsy Hypercellular bone marrow tih extensive involvment by plasma cell neoplasm (Plasma cells 85%). FISH: Presence of 2 copies of the IGH/FGFR3, ATM, CCND1/IGH, and p53 probes.  Loss of 13q34 probes in 40% of  cells. gain of chromose 12 and either loss of chorm   03/25/2014 -  Chemotherapy Planning Kyprolis 15 mg/m2 plus dexamethasone weekly plus revlimid (35m) daily on 06/29.   06/14/2014 -  Chemotherapy Patient's last dose of Kyprolis was on 05/26/14. Since then chemo held due to neutropenia despite GCSF support. I will hold Revlimid also as of 06/14/14     INTERVAL HISTORY:  [name]89y.o. female with a history of IgG Lambda Multiple myeloma complicated by  non-compliance, ESRDz on H/D(Tues, Thursday and Sat) is here for follow up. She was initially consulted by [place] 03/17[place]14. She was last seen by me on 06/09/14.  Today,she reports feeling much better with improved energy.  She reports seeing dentistry and completing a course of antibiotics with resolution of her dental pain.  She denies fevers or chills. She had a recent hospitalization (6/11 - 03/17/2014) due to profound anemia. She was transfused packed RBCs doing this admission.   She complains of swelling in her ankles. She undergoes dialysis every Tuesday, Thursday and Sat. Her revlimid dose was reduced 50%.  No new problems. She has been taking dexamethasone weekly and revlimid at 5 mg daily dose. I held revlimid as of last week. Her labs indicated following:  1. IgG LEVEL 3920 (9/21) DOWN FROM 4820 (8/24)  2. NA 133 K 5.6 HD TOMORROW BUN 50 CR 9.2 CA 8.9  3.WBC 2.9 UP FROM 2.3, ANC 900 UP FR 700, HEMOGLOBIN 10.3, PLT 176  MEDICAL HISTORY: Past Medical History  Diagnosis Date  . Renal disorder   . ESRD (end stage renal disease)   . Hypertension   . Multiple myeloma 2009  . Thyroid disease   . Hyperparathyroidism   . Thrombocytopenia   . Anemia   . Shortness of breath   . Pneumonia   . GERD (gastroesophageal reflux disease)   . 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    ALLERGIES:  has No Known Allergies.  MEDICATIONS: has a current medication list which includes the following prescription(s): acetaminophen-codeine, acyclovir, amlodipine, aspirin ec, cyproheptadine, dexamethasone, furosemide, hydralazine, hydrocodone-homatropine, isosorbide mononitrate, lenalidomide, lorazepam, metolazone, multiple vitamins-iron, nifedipine, ondansetron, oxybutynin, prochlorperazine,  and sevelamer carbonate.  SURGICAL HISTORY:  Past Surgical History  Procedure Laterality Date  . Abdominal hysterectomy      Pt. denies    REVIEW OF SYSTEMS:   Constitutional: Denies fevers, chills or abnormal weight loss. States that they pulled too much fluid off her during dialysis. Eyes: Denies blurriness of vision Ears, nose, mouth, throat, and face: Denies mucositis or sore throat Respiratory: Denies cough, dyspnea or wheezes Cardiovascular: Denies palpitation, chest discomfort or lower extremity swelling Gastrointestinal:  Denies nausea, heartburn or change in bowel habits Skin: Denies abnormal skin rashes Lymphatics: Denies new lymphadenopathy or easy bruising Neurological:Denies numbness, tingling or new weaknesses Behavioral/Psych: Mood is stable, no new changes  All other systems were reviewed with the patient and are negative.  PHYSICAL EXAMINATION: ECOG PERFORMANCE STATUS: 1  Blood pressure 133/62, pulse 52, temperature 98.2 F (36.8 C), temperature source Oral, resp. rate 18, height '5\' 7"'  (1.702 m), weight 138 lb 8 oz (62.823 kg).  GENERAL:alert, no distress and comfortable; well developed and well nourished. Anxious SKIN: skin color, texture, turgor are normal, no rashes or significant lesions EYES: normal, Conjunctiva  are pink and non-injected, sclera clear OROPHARYNX:no exudate, no erythema and lips, buccal mucosa, and tongue normal  NECK: supple, thyroid normal size, non-tender, without nodularity LYMPH:  no palpable lymphadenopathy in the cervical, axillary or supraclavicular LUNGS: clear to auscultation with normal breathing effort, no wheezes or rhonchi HEART: regular rate & rhythm and no murmurs and 1+ edema in ankles bilaterally; L AVF ABDOMEN:abdomen soft, non-tender and normal bowel sounds Musculoskeletal:no cyanosis of digits and no clubbing  NEURO: alert & oriented x 3 with fluent speech, no focal motor/sensory deficits   RADIOGRAPHIC STUDIES:     Dg Bone Survey Met  04/11/14   CLINICAL DATA:  Myeloma.  Right knee pain.  EXAM: METASTATIC BONE SURVEY  COMPARISON:  None.  FINDINGS: Multiple images are performed of the axial and appendicular skeleton.  Multiple small lytic lesions are identified within the skull consistent with myeloma. A small lytic lesion is identified within the right scapula.  Degenerative changes are identified within the mid cervical spine. No suspicious lytic or blastic lesions are identified within the spine, or long bones. Surgical clips are identified in the left upper arm. Degenerative changes are seen in the knees bilaterally, right greater than left.  The heart is mildly enlarged. There are areas of atelectasis within the right lung. No focal consolidations or pulmonary edema.  IMPRESSION: 1. Small lytic lesions within the skull and right scapula. 2. Degenerative changes in the spine and knees, right greater than left. 3. Cardiomegaly without pulmonary edema.   Electronically Signed   By: Shon Hale M.D.   On: 11-Apr-2014 10:13     ASSESSMENT: [name] 73 y.o. female with a history of Multiple Myeloma and ESRD on HD currently on a modified regimen of Dexamethasone, Revlimid and Carfilzomib. Chemo again held due to neutropenia and I am giving her 2 doses of Granix on 9/22 and 06/24/14. This is because she has transport issues and on T/T/S she goes for hemodialysis. We will plan starting cycle 4 on 9/28 pending counts recovery. The platelets have already recovered and the white count is trending towards normal. She will continue dexamethasone which has anti-myeloma properties as well.  PLAN:   1. IgG Lambda MM, rapidly progressive.  --Her Bone marrow is packed with 85% plasma cells likely explaining her refractory anemia and pancytopenia. Her protein was 14.7 down to 12.5 to 10.3 to 3.9 grams  since starting chemotherapy. Her case has been difficult due to a history of non-compliance. She stated that she stopped her  recent treatment in [place] due to being hospitalized. Per records from her primary oncologist's office, it was likely due to her kidney failure due to progression of her multiple myeloma. Her last treatment is as noted above. Considering she received only one cycle of carfilzomib plus dexamethasone, we started her back on this regiment based on Nicole Kindred et. Al, NEJM, 2015).   *Carfilzomib (15 mg/m2) on days 1, 3, 8, 10, 15, 17  *Lenalidomide 29m daily (please take follow H/D on Tue/th/Sa). --dose reduced by 50% due to delayed count recovery. Revlimid is now being held as of today 06/14/14 *Dexamethasone 12 mg weekly  We presented the treatment indications, benefits and side-effects including but not limited to myelosuprresion which can result in life-threatening infections and anemia and low plts, and nausea or vomiting or rashes. Continue acyclovir for antiviral prophylaxis.  She understood the indications, risks and benefits and chose to proceed. She started therapy on 03/29/2014. She received 4 total doses of carfilzomib for cycle #1  and a total of 5 doses for cycle #2.  We will obtain Multiple myeloma markers monthly starting on 08/24.  Markers from last month revealed a decrease in her M-spike. Her IgG level on 05/24/2014 was down to 4.8 grams (4820 mg) from 11.3 grams (11300 mg) in June 2015. Today her IgG WAS 3920 mg.  2. Pancytopenia with anemia likely multifactorial.  --Secondary to #1. We will start on aranesp biweekly if not provided with her H/D.  She cannot have transfusions on the days of her dialysis (Tues, Thurs and Sat). Her ANC is 900. She was advised extensively on symptoms of Neutropenia.  Her hemoglobin is 10.3. Her plts are up to 176.  3. ESRDz.  --Likely secondary to #1. Continue hemodialysis on Tues, Thursday and Sat.   4. Psuedohyponatremia.  --Likely due to #1. Treat underlying disorder. Her sodium is stable at 133.   5. History of Noncompliance.  -- Ongoing problem but we are  trying to work with her.  6. Lower extremity swelling. --.  May require additional fluid pulled off with h/d. Lasix prn.  7. Poor nutrition. --She has had dental pain with resolution which may in part explain some weight lost.  We will refer to nutrition with consideration for nutritional supplementation with boost and/or ensure.   8. Follow up.  --Patient will follow up in our office in 1 week so we can resume her Carfilzomib upon white count recovery and that would be on 06/28/14.  All questions were answered. The patient knows to call the clinic with any problems, questions or concerns. We can certainly see the patient much sooner if necessary.  I spent 20 minutes counseling the patient face to face. The total time spent in the appointment was 25 minutes.    Bernadene Bell, MD Medical Hematologist/Oncologist [place] Pager: [phone] Office No: [phone]

## 2014-06-21 NOTE — Telephone Encounter (Signed)
Pt confirmed labs/ov per 09/21 POF, sent msg to add chemo, gave pt AVS...Marland KitchenMarland KitchenKJ

## 2014-06-21 NOTE — Addendum Note (Signed)
Addended by: Margaret Pyle on: 06/21/2014 03:36 PM   Modules accepted: Orders

## 2014-06-22 ENCOUNTER — Telehealth: Payer: Self-pay | Admitting: *Deleted

## 2014-06-22 ENCOUNTER — Encounter: Payer: Self-pay | Admitting: Hematology

## 2014-06-22 ENCOUNTER — Ambulatory Visit: Payer: Self-pay

## 2014-06-22 LAB — IGG: IgG (Immunoglobin G), Serum: 3920 mg/dL — ABNORMAL HIGH (ref 690–1700)

## 2014-06-22 NOTE — Telephone Encounter (Signed)
Per Dr. Lona Kettle, I informed patient's daughter that the [place] level is coming down. Daughter verbalized understanding.

## 2014-06-22 NOTE — Telephone Encounter (Signed)
Message copied by Hebert Soho on Tue Jun 22, 2014  2:19 PM ------      Message from: Hayes Ludwig      Created: Tue Jun 22, 2014 12:56 PM       plz call her and let her know the igg protein is coming down which is good            aasim       ----- Message -----         From: [facility]         Sent: 06/21/2014   2:22 PM           To: Aasim Marla Roe, MD                   ------

## 2014-06-22 NOTE — Telephone Encounter (Signed)
Per staff message and POF I have scheduled appts. Advised scheduler of appts. JMW  

## 2014-06-23 ENCOUNTER — Ambulatory Visit: Payer: Self-pay

## 2014-06-23 ENCOUNTER — Ambulatory Visit (HOSPITAL_BASED_OUTPATIENT_CLINIC_OR_DEPARTMENT_OTHER): Payer: Medicare Other

## 2014-06-23 VITALS — BP 124/70 | HR 57 | Temp 98.0°F

## 2014-06-23 DIAGNOSIS — C9000 Multiple myeloma not having achieved remission: Secondary | ICD-10-CM

## 2014-06-23 DIAGNOSIS — D61818 Other pancytopenia: Secondary | ICD-10-CM

## 2014-06-23 DIAGNOSIS — Z5189 Encounter for other specified aftercare: Secondary | ICD-10-CM

## 2014-06-23 MED ORDER — TBO-FILGRASTIM 480 MCG/0.8ML ~~LOC~~ SOSY
480.0000 ug | PREFILLED_SYRINGE | Freq: Once | SUBCUTANEOUS | Status: AC
  Administered 2014-06-23: 480 ug via SUBCUTANEOUS
  Filled 2014-06-23: qty 0.8

## 2014-06-28 ENCOUNTER — Ambulatory Visit: Payer: Self-pay

## 2014-06-28 ENCOUNTER — Ambulatory Visit (HOSPITAL_BASED_OUTPATIENT_CLINIC_OR_DEPARTMENT_OTHER): Payer: Medicare Other

## 2014-06-28 ENCOUNTER — Encounter: Payer: Self-pay | Admitting: Hematology

## 2014-06-28 ENCOUNTER — Other Ambulatory Visit (HOSPITAL_BASED_OUTPATIENT_CLINIC_OR_DEPARTMENT_OTHER): Payer: Medicare Other

## 2014-06-28 ENCOUNTER — Ambulatory Visit (HOSPITAL_BASED_OUTPATIENT_CLINIC_OR_DEPARTMENT_OTHER): Payer: Medicare Other | Admitting: Hematology

## 2014-06-28 ENCOUNTER — Other Ambulatory Visit: Payer: Self-pay

## 2014-06-28 ENCOUNTER — Telehealth: Payer: Self-pay | Admitting: Hematology

## 2014-06-28 VITALS — BP 127/66 | HR 54 | Temp 98.3°F | Resp 17 | Ht 67.0 in | Wt 139.5 lb

## 2014-06-28 DIAGNOSIS — Z5112 Encounter for antineoplastic immunotherapy: Secondary | ICD-10-CM

## 2014-06-28 DIAGNOSIS — D649 Anemia, unspecified: Secondary | ICD-10-CM

## 2014-06-28 DIAGNOSIS — Z9119 Patient's noncompliance with other medical treatment and regimen: Secondary | ICD-10-CM

## 2014-06-28 DIAGNOSIS — M7989 Other specified soft tissue disorders: Secondary | ICD-10-CM

## 2014-06-28 DIAGNOSIS — C9000 Multiple myeloma not having achieved remission: Secondary | ICD-10-CM

## 2014-06-28 DIAGNOSIS — N186 End stage renal disease: Secondary | ICD-10-CM

## 2014-06-28 DIAGNOSIS — E871 Hypo-osmolality and hyponatremia: Secondary | ICD-10-CM

## 2014-06-28 DIAGNOSIS — D61818 Other pancytopenia: Secondary | ICD-10-CM

## 2014-06-28 DIAGNOSIS — Z91199 Patient's noncompliance with other medical treatment and regimen due to unspecified reason: Secondary | ICD-10-CM

## 2014-06-28 DIAGNOSIS — R609 Edema, unspecified: Secondary | ICD-10-CM

## 2014-06-28 LAB — BASIC METABOLIC PANEL (CC13)
Anion Gap: 8 mEq/L (ref 3–11)
BUN: 45.6 mg/dL — AB (ref 7.0–26.0)
CALCIUM: 9.4 mg/dL (ref 8.4–10.4)
CHLORIDE: 102 meq/L (ref 98–109)
CO2: 24 meq/L (ref 22–29)
CREATININE: 8.5 mg/dL — AB (ref 0.6–1.1)
GLUCOSE: 83 mg/dL (ref 70–140)
Potassium: 5 mEq/L (ref 3.5–5.1)
Sodium: 134 mEq/L — ABNORMAL LOW (ref 136–145)

## 2014-06-28 LAB — CBC WITH DIFFERENTIAL/PLATELET
BASO%: 0.7 % (ref 0.0–2.0)
BASOS ABS: 0 10*3/uL (ref 0.0–0.1)
EOS%: 2.2 % (ref 0.0–7.0)
Eosinophils Absolute: 0.1 10*3/uL (ref 0.0–0.5)
HEMATOCRIT: 33.6 % — AB (ref 34.8–46.6)
HEMOGLOBIN: 10.5 g/dL — AB (ref 11.6–15.9)
LYMPH#: 1.3 10*3/uL (ref 0.9–3.3)
LYMPH%: 32.6 % (ref 14.0–49.7)
MCH: 33.7 pg (ref 25.1–34.0)
MCHC: 31.2 g/dL — AB (ref 31.5–36.0)
MCV: 107.9 fL — ABNORMAL HIGH (ref 79.5–101.0)
MONO#: 0.4 10*3/uL (ref 0.1–0.9)
MONO%: 10.5 % (ref 0.0–14.0)
NEUT#: 2.2 10*3/uL (ref 1.5–6.5)
NEUT%: 54 % (ref 38.4–76.8)
PLATELETS: 179 10*3/uL (ref 145–400)
RBC: 3.11 10*6/uL — ABNORMAL LOW (ref 3.70–5.45)
RDW: 19.3 % — ABNORMAL HIGH (ref 11.2–14.5)
WBC: 4.1 10*3/uL (ref 3.9–10.3)

## 2014-06-28 MED ORDER — DEXAMETHASONE SODIUM PHOSPHATE 10 MG/ML IJ SOLN
10.0000 mg | Freq: Once | INTRAMUSCULAR | Status: AC
  Administered 2014-06-28: 10 mg via INTRAVENOUS

## 2014-06-28 MED ORDER — ONDANSETRON 8 MG/NS 50 ML IVPB
INTRAVENOUS | Status: AC
  Filled 2014-06-28: qty 8

## 2014-06-28 MED ORDER — FUROSEMIDE 20 MG PO TABS: 20.0000 mg | ORAL_TABLET | Freq: Every day | ORAL | Status: DC

## 2014-06-28 MED ORDER — DEXTROSE 5 % IV SOLN
15.0000 mg/m2 | Freq: Once | INTRAVENOUS | Status: AC
  Administered 2014-06-28: 26 mg via INTRAVENOUS
  Filled 2014-06-28: qty 13

## 2014-06-28 MED ORDER — SODIUM CHLORIDE 0.9 % IV SOLN
Freq: Once | INTRAVENOUS | Status: AC
  Administered 2014-06-28: 15:00:00 via INTRAVENOUS

## 2014-06-28 MED ORDER — LENALIDOMIDE 2.5 MG PO CAPS: 2.5000 mg | ORAL_CAPSULE | Freq: Every day | ORAL | Status: DC

## 2014-06-28 MED ORDER — DEXAMETHASONE SODIUM PHOSPHATE 10 MG/ML IJ SOLN
INTRAMUSCULAR | Status: AC
  Filled 2014-06-28: qty 1

## 2014-06-28 MED ORDER — ONDANSETRON 8 MG/50ML IVPB (CHCC)
8.0000 mg | Freq: Once | INTRAVENOUS | Status: AC
  Administered 2014-06-28: 8 mg via INTRAVENOUS

## 2014-06-28 NOTE — Telephone Encounter (Signed)
gv pt appt schedule for oct.  °

## 2014-06-28 NOTE — Progress Notes (Signed)
[facility] OFFICE PROGRESS NOTE Date of Visit: 06/28/2014   DIAGNOSIS: Multiple Myeloma for follow up, ESRD on HD T/T/S schedule.  CURRENT TREATMENT:  Carfilzomib 15 mg/m2 on days 1,3, 8, 10,15, 17 (her dialysis is on day 2); Dexamethasone 12 mg weekly on days 1, 8, 15, 22; revlimid 5 mg daily for 28 days.    Carfilzomib started on 03/29/2014.  She received it on 6/29 and 03/31/2014; Doses on 7/6 and 7/8 were held due to severe anemia. 7/13 and 7/15 she got it so 4 doses for 1st cycle.  Cycle 2 started 7/27 and 7/29, 8/5, 8/10 and 8/12 so she got 5 doses. With cycle 3 she got doses on 8/24 and 8/26. 8/31 Chemo held low ANC 500, on 06/09/14 we did not do chemo because ANC 700. She got 1 dose of neupogen 480 mcg sq. She continued the weekly Dexamethasone and Revlimid at 5 mg daily. Labs last week show persistent neutropenia so I am going held Revlimid also as of 06/14/14 and delayed chemo for another week. She will start her chemo today as CBC results are normal.     Multiple myeloma   03/30/2008 Initial Diagnosis Multiple myeloma   03/30/2008 - 05/01/2012 Chemotherapy Revlimid/Dex then Velcade (started on 07/13/2011 ending on 09/14/2011) without response.  Managed by Dr. Chauncey Fischer of [place], [place] [id number]   07/24/2010 -  Chemotherapy Received zometa  intermittently over one year.  Creatinine was 1.0 on 07/24/2010. 1.7 on 08/10/2011.    05/01/2012 - 09/01/2012 Chemotherapy Starte Pomalidomide s/p 4 cycles. Referred to [facility] for consideration of ASCT.     10/08/2012 Bone Marrow Biopsy Normocellular marrow with residual/recurrently plasma cell myeloma, 60-70 % of overall marrow celluarity.  Flow: c/w plasma cell neoplasm, plasma cell infiltrate comprises 60-70 % of overall marrow cellularity; FISH: + for 1 q21/CKS1B gain; RB1 (13q14)   10/08/2012 Bone Marrow Biopsy Continued... Cytogenetics showed normal female karyotype.    10/29/2012 Tumor Marker High IgG (6019), high lamda  free light chain (409.32) with low IgA, IgM and kappa free light chain.    04/21/2013 Imaging Skeletal survey.  No suspcious lytic or blastic lesions visualized.  Severe multilevel degenerative disc disease in the cervical spine.    04/29/2013 Imaging PeT. No discrete suspicious focus of FDG uptake identified.    05/20/2013 - 06/09/2013 Chemotherapy Started carfilxomib 39 mg on 08/25, 08/26, day 8 (06/02/13), day 9 on 06/03/2013 and on 06/09/2013.     12/03/2013 Treatment Plan Change Last office visit with Dr. Glenna Durand.  She decline further chemotherapy.   Her son passed away and she relocated to [place] to stay with her daughter [name].    03/11/2014 - 03/17/2014 Hospital Admission Admitted for low hemoglobin. Dr. Burney Gauze consulted.  SPEP,  UPEP and Bone marrow biopsy obtained.    IgG 11,300. M-spike 7.52, kappa lamda ratio 0 with lambda free light chains totaling 1260.    03/12/2014 Imaging Skeletal survey. 1. Small lytic lesions within the skull and right scapula. 2. Degenerative changes in the spine and knees, right greater than left. 3. Cardiomegaly without pulmonary edema.   03/17/2014 Bone Marrow Biopsy Hypercellular bone marrow tih extensive involvment by plasma cell neoplasm (Plasma cells 85%). FISH: Presence of 2 copies of the IGH/FGFR3, ATM, CCND1/IGH, and p53 probes.  Loss of 13q34 probes in 40% of cells. gain of chromose 12 and either loss of chorm   03/25/2014 -  Chemotherapy Planning Kyprolis 15 mg/m2 plus dexamethasone weekly plus revlimid (  92m) daily on 06/29.   06/14/2014 -  Chemotherapy Patient's last dose of Kyprolis was on 05/26/14. Since then chemo held due to neutropenia despite GCSF support. I will hold Revlimid also as of 06/14/14   06/28/2014 -  Chemotherapy New cycle of Kyprolis starts today.     INTERVAL HISTORY:  Carrie Lapre73y.o. female with a history of IgG Lambda Multiple myeloma complicated by non-compliance, ESRDz on H/D(Tues, Thursday and Sat) is here for follow up.  She was initially consulted by [place] 03/17[place]14. She was last seen by me on 06/09/14.  Today,she reports feeling much better with improved energy.  She reports seeing dentistry and completing a course of antibiotics with resolution of her dental pain.  She denies fevers or chills. She had a recent hospitalization (6/11 - 03/17/2014) due to profound anemia. She was transfused packed RBCs doing this admission.   She complains of swelling in her ankles. She undergoes dialysis every Tuesday, Thursday and Sat. Her revlimid dose was reduced 50%.  No new problems. She has been taking dexamethasone weekly and revlimid at 5 mg daily dose. I held revlimid as of last week. She will need her 3 teeth extracted but would be at risk of ONJ, the dentist called me today and will refer patient to an oral surgeon.            MEDICAL HISTORY: Past Medical History  Diagnosis Date  . Renal disorder   . ESRD (end stage renal disease)   . Hypertension   . Multiple myeloma 2009  . Thyroid disease   . Hyperparathyroidism   . Thrombocytopenia   . Anemia   . Shortness of breath   . Pneumonia   . GERD (gastroesophageal reflux disease)   . 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    ALLERGIES:  has No Known Allergies.  MEDICATIONS: has a current medication list which includes the following prescription(s): acetaminophen-codeine, acyclovir, amlodipine, aspirin ec, cyproheptadine, dexamethasone, furosemide, hydralazine, hydrocodone-homatropine, isosorbide mononitrate, lenalidomide, lorazepam, metolazone, multiple vitamins-iron, nifedipine, ondansetron, oxybutynin, prochlorperazine, and sevelamer carbonate, and the following Facility-Administered Medications: carfilzomib (KYPROLIS) 26 mg in dextrose 5 %  50 mL chemo infusion and ondansetron.  SURGICAL HISTORY:  Past Surgical History  Procedure Laterality Date  . Abdominal hysterectomy      Pt. denies    REVIEW OF SYSTEMS:   Constitutional: Denies fevers, chills or abnormal weight loss. States that they pulled too much fluid off her during dialysis. Eyes: Denies blurriness of vision Ears, nose, mouth, throat, and face: Denies mucositis or sore throat Respiratory: Denies cough, dyspnea or wheezes Cardiovascular: Denies palpitation, chest discomfort or lower extremity swelling Gastrointestinal:  Denies nausea, heartburn or change in bowel habits Skin: Denies abnormal skin rashes Lymphatics: Denies new lymphadenopathy or easy bruising Neurological:Denies numbness, tingling or new weaknesses Behavioral/Psych: Mood is stable, no new changes  All other systems were reviewed with the patient and are negative.  PHYSICAL EXAMINATION: ECOG PERFORMANCE STATUS: 1  Blood pressure 127/66, pulse 54, temperature 98.3 F (36.8 C), temperature source Oral, resp. rate 17, height _0  (1.702 m), weight 139 lb 8 oz (63.277 kg), SpO2 97.00%.  GENERAL:alert, no distress and comfortable; well developed and well nourished. Anxious SKIN: skin color, texture, turgor are normal, no rashes or significant lesions EYES:  normal, Conjunctiva are pink and non-injected, sclera clear OROPHARYNX:no exudate, no erythema and lips, buccal mucosa, and tongue normal  NECK: supple, thyroid normal size, non-tender, without nodularity LYMPH:  no palpable lymphadenopathy in the cervical, axillary or supraclavicular LUNGS: clear to auscultation with normal breathing effort, no wheezes or rhonchi HEART: regular rate & rhythm and no murmurs and 1+ edema in ankles bilaterally; L AVF ABDOMEN:abdomen soft, non-tender and normal bowel sounds Musculoskeletal:no cyanosis of digits and no clubbing  NEURO: alert & oriented x 3 with fluent speech, no focal motor/sensory  deficits   RADIOGRAPHIC STUDIES:    Dg Bone Survey Met  03/12/2014   CLINICAL DATA:  Myeloma.  Right knee pain.  EXAM: METASTATIC BONE SURVEY  COMPARISON:  None.  FINDINGS: Multiple images are performed of the axial and appendicular skeleton.  Multiple small lytic lesions are identified within the skull consistent with myeloma. A small lytic lesion is identified within the right scapula.  Degenerative changes are identified within the mid cervical spine. No suspicious lytic or blastic lesions are identified within the spine, or long bones. Surgical clips are identified in the left upper arm. Degenerative changes are seen in the knees bilaterally, right greater than left.  The heart is mildly enlarged. There are areas of atelectasis within the right lung. No focal consolidations or pulmonary edema.  IMPRESSION: 1. Small lytic lesions within the skull and right scapula. 2. Degenerative changes in the spine and knees, right greater than left. 3. Cardiomegaly without pulmonary edema.   Electronically Signed   By: Shon Hale M.D.   On: 03/12/2014 10:13     ASSESSMENT: [name] 73 y.o. female with a history of Multiple Myeloma and ESRD on HD currently on a modified regimen of Dexamethasone, Revlimid and Carfilzomib. Chemo held last few weeks due to neutropenia from chemo and Revlimid. She has transport issues and on T/T/S she goes for hemodialysis. She will start cycle 4 today on 06/28/14 as counts have recovered. The platelets have already recovered and the white count is normal. She will continue dexamethasone which has anti-myeloma properties as well.She will resume low dose Revlimid today.  PLAN:   1. IgG Lambda MM, rapidly progressive.  --Her Bone marrow is packed with 85% plasma cells likely explaining her refractory anemia and pancytopenia. Her protein was 14.7 down to 12.5 to 10.3 to 3.9 grams  since starting chemotherapy. Her case has been difficult due to a history of non-compliance. She  stated that she stopped her recent treatment in [place] due to being hospitalized. Per records from her primary oncologist's office, it was likely due to her kidney failure due to progression of her multiple myeloma. Her last treatment is as noted above. Considering she received only one cycle of carfilzomib plus dexamethasone, we started her back on this regiment based on Nicole Kindred et. Al, NEJM, 2015).   *Carfilzomib (15 mg/m2) on days 1, 3, 8, 10, 15, 17  *Lenalidomide 25m daily (please take follow H/D on Tue/th/Sa). --dose reduced by 50% due to delayed count recovery. Revlimid is now being held as of today 06/14/14 *Dexamethasone 12 mg weekly  We presented the treatment indications, benefits and side-effects including but not limited to myelosuprresion which can result in life-threatening infections and anemia and low plts, and nausea or vomiting or rashes. Continue acyclovir for antiviral prophylaxis.  She understood the indications, risks and benefits and chose to proceed. She started therapy on 03/29/2014. She received 4 total doses of carfilzomib for cycle #1 and a total  of 5 doses for cycle #2.  We will obtain Multiple myeloma markers monthly starting on 08/24.  Markers from last month revealed a decrease in her M-spike. Her IgG level on 05/24/2014 was down to 4.8 grams (4820 mg) from 11.3 grams (11300 mg) in June 2015. Last week her IgG WAS 3920 mg.  2. Pancytopenia with anemia likely multifactorial.  --Secondary to #1. We will start on aranesp biweekly if not provided with her H/D.  She cannot have transfusions on the days of her dialysis (Tues, Thurs and Sat). Her ANC is 2200. She was advised extensively on symptoms of Neutropenia.  Her hemoglobin is 10.5. Her plts are up to 179.  3. ESRDz.  --Likely secondary to #1. Continue hemodialysis on Tues, Thursday and Sat.   4. Psuedohyponatremia.  --Likely due to #1. Treat underlying disorder. Her sodium is stable at 133.   5. History of Noncompliance.   -- Ongoing problem but we are trying to work with her.  6. Lower extremity swelling. --.  May require additional fluid pulled off with h/d. Lasix prn.  7. Poor nutrition. --She has had dental pain with resolution which may in part explain some weight lost.  We will refer to nutrition with consideration for nutritional supplementation with boost and/or ensure. She will be referred to oral surgeon. She is not on bisphosphonate currently.  8. Follow up.  --Patient will follow up in our office in 1 week so we can check her counts. She will get a Neupogen every Friday after getting 2 days of chemo for 3 weeks.  All questions were answered. The patient knows to call the clinic with any problems, questions or concerns. We can certainly see the patient much sooner if necessary. Lasix prescription was e-scripted today.  I spent 20 minutes counseling the patient face to face. The total time spent in the appointment was 25 minutes.    Bernadene Bell, MD Medical Hematologist/Oncologist [place] Pager: [phone] Office No: [phone]

## 2014-06-28 NOTE — Patient Instructions (Signed)
[facility] Discharge Instructions for Patients Receiving Chemotherapy  Today you received the following chemotherapy agents: Kyprolis  To help prevent nausea and vomiting after your treatment, we encourage you to take your nausea medication: as directed.   If you develop nausea and vomiting that is not controlled by your nausea medication, call the clinic.   BELOW ARE SYMPTOMS THAT SHOULD BE REPORTED IMMEDIATELY:  *FEVER GREATER THAN 100.5 F  *CHILLS WITH OR WITHOUT FEVER  NAUSEA AND VOMITING THAT IS NOT CONTROLLED WITH YOUR NAUSEA MEDICATION  *UNUSUAL SHORTNESS OF BREATH  *UNUSUAL BRUISING OR BLEEDING  TENDERNESS IN MOUTH AND THROAT WITH OR WITHOUT PRESENCE OF ULCERS  *URINARY PROBLEMS  *BOWEL PROBLEMS  UNUSUAL RASH Items with * indicate a potential emergency and should be followed up as soon as possible.  Feel free to call the clinic you have any questions or concerns. The clinic phone number is [phone].    

## 2014-06-28 NOTE — Telephone Encounter (Signed)
Received call [phone] for Rx refil of revlimid from [facility]. Rx prepared and faxed.

## 2014-06-28 NOTE — Progress Notes (Signed)
Per Dr. Lona Kettle; OK to treat with Crea 8.5 BUN 45.6

## 2014-06-29 ENCOUNTER — Ambulatory Visit: Payer: Self-pay

## 2014-06-30 ENCOUNTER — Encounter: Payer: Self-pay | Admitting: *Deleted

## 2014-06-30 ENCOUNTER — Ambulatory Visit (HOSPITAL_BASED_OUTPATIENT_CLINIC_OR_DEPARTMENT_OTHER): Payer: Medicare Other

## 2014-06-30 VITALS — BP 97/61 | HR 65 | Temp 98.3°F | Resp 18

## 2014-06-30 DIAGNOSIS — Z5112 Encounter for antineoplastic immunotherapy: Secondary | ICD-10-CM

## 2014-06-30 DIAGNOSIS — C9000 Multiple myeloma not having achieved remission: Secondary | ICD-10-CM

## 2014-06-30 MED ORDER — ONDANSETRON 8 MG/50ML IVPB (CHCC)
8.0000 mg | Freq: Once | INTRAVENOUS | Status: AC
  Administered 2014-06-30: 8 mg via INTRAVENOUS

## 2014-06-30 MED ORDER — DEXAMETHASONE SODIUM PHOSPHATE 10 MG/ML IJ SOLN
10.0000 mg | Freq: Once | INTRAMUSCULAR | Status: AC
  Administered 2014-06-30: 10 mg via INTRAVENOUS

## 2014-06-30 MED ORDER — SODIUM CHLORIDE 0.9 % IV SOLN
Freq: Once | INTRAVENOUS | Status: AC
  Administered 2014-06-30: 13:00:00 via INTRAVENOUS

## 2014-06-30 MED ORDER — DEXAMETHASONE SODIUM PHOSPHATE 10 MG/ML IJ SOLN
INTRAMUSCULAR | Status: AC
  Filled 2014-06-30: qty 1

## 2014-06-30 MED ORDER — CARFILZOMIB CHEMO INJECTION 60 MG
15.0000 mg/m2 | Freq: Once | INTRAVENOUS | Status: AC
  Administered 2014-06-30: 26 mg via INTRAVENOUS
  Filled 2014-06-30: qty 13

## 2014-06-30 MED ORDER — ONDANSETRON 8 MG/NS 50 ML IVPB
INTRAVENOUS | Status: AC
  Filled 2014-06-30: qty 8

## 2014-06-30 NOTE — Patient Instructions (Signed)
[facility] Discharge Instructions for Patients Receiving Chemotherapy  Today you received the following chemotherapy agents Kyprolis To help prevent nausea and vomiting after your treatment, we encourage you to take your nausea medication as prescribed.  If you develop nausea and vomiting that is not controlled by your nausea medication, call the clinic.   BELOW ARE SYMPTOMS THAT SHOULD BE REPORTED IMMEDIATELY:  *FEVER GREATER THAN 100.5 F  *CHILLS WITH OR WITHOUT FEVER  NAUSEA AND VOMITING THAT IS NOT CONTROLLED WITH YOUR NAUSEA MEDICATION  *UNUSUAL SHORTNESS OF BREATH  *UNUSUAL BRUISING OR BLEEDING  TENDERNESS IN MOUTH AND THROAT WITH OR WITHOUT PRESENCE OF ULCERS  *URINARY PROBLEMS  *BOWEL PROBLEMS  UNUSUAL RASH Items with * indicate a potential emergency and should be followed up as soon as possible.  Feel free to call the clinic you have any questions or concerns. The clinic phone number is [phone].    

## 2014-07-02 ENCOUNTER — Telehealth: Payer: Self-pay | Admitting: Hematology

## 2014-07-02 ENCOUNTER — Ambulatory Visit (HOSPITAL_BASED_OUTPATIENT_CLINIC_OR_DEPARTMENT_OTHER): Payer: Medicare Other

## 2014-07-02 VITALS — BP 124/81 | HR 62 | Temp 99.0°F | Resp 16

## 2014-07-02 DIAGNOSIS — Z08 Encounter for follow-up examination after completed treatment for malignant neoplasm: Secondary | ICD-10-CM

## 2014-07-02 DIAGNOSIS — D61818 Other pancytopenia: Secondary | ICD-10-CM

## 2014-07-02 DIAGNOSIS — C9000 Multiple myeloma not having achieved remission: Secondary | ICD-10-CM

## 2014-07-02 MED ORDER — TBO-FILGRASTIM 480 MCG/0.8ML ~~LOC~~ SOSY
480.0000 ug | PREFILLED_SYRINGE | Freq: Once | SUBCUTANEOUS | Status: AC
  Administered 2014-07-02: 480 ug via SUBCUTANEOUS
  Filled 2014-07-02: qty 0.8

## 2014-07-02 NOTE — Patient Instructions (Signed)

## 2014-07-02 NOTE — Telephone Encounter (Signed)
pt need appt times changed and has dialysis on tue thurs....printed pt new sched

## 2014-07-05 ENCOUNTER — Ambulatory Visit: Payer: Self-pay

## 2014-07-05 ENCOUNTER — Other Ambulatory Visit: Payer: Self-pay

## 2014-07-06 ENCOUNTER — Ambulatory Visit: Payer: Self-pay

## 2014-07-06 ENCOUNTER — Telehealth: Payer: Self-pay | Admitting: Hematology

## 2014-07-06 NOTE — Telephone Encounter (Signed)
returned pt call and advised on new d.t.Carrie KitchenMarland Kitchenper MD staff dbl book

## 2014-07-07 ENCOUNTER — Telehealth: Payer: Self-pay | Admitting: Hematology

## 2014-07-07 ENCOUNTER — Ambulatory Visit (HOSPITAL_BASED_OUTPATIENT_CLINIC_OR_DEPARTMENT_OTHER): Payer: Medicare Other

## 2014-07-07 ENCOUNTER — Other Ambulatory Visit: Payer: Self-pay | Admitting: *Deleted

## 2014-07-07 ENCOUNTER — Other Ambulatory Visit (HOSPITAL_BASED_OUTPATIENT_CLINIC_OR_DEPARTMENT_OTHER): Payer: Medicare Other

## 2014-07-07 ENCOUNTER — Ambulatory Visit (HOSPITAL_BASED_OUTPATIENT_CLINIC_OR_DEPARTMENT_OTHER): Payer: Medicare Other | Admitting: Hematology

## 2014-07-07 VITALS — BP 124/81 | HR 57 | Temp 98.8°F | Resp 18 | Ht 67.0 in | Wt 144.8 lb

## 2014-07-07 DIAGNOSIS — C9000 Multiple myeloma not having achieved remission: Secondary | ICD-10-CM

## 2014-07-07 DIAGNOSIS — Z5112 Encounter for antineoplastic immunotherapy: Secondary | ICD-10-CM

## 2014-07-07 DIAGNOSIS — N186 End stage renal disease: Secondary | ICD-10-CM

## 2014-07-07 DIAGNOSIS — R2240 Localized swelling, mass and lump, unspecified lower limb: Secondary | ICD-10-CM

## 2014-07-07 DIAGNOSIS — D61818 Other pancytopenia: Secondary | ICD-10-CM

## 2014-07-07 DIAGNOSIS — R634 Abnormal weight loss: Secondary | ICD-10-CM

## 2014-07-07 LAB — CBC WITH DIFFERENTIAL/PLATELET
BASO%: 0.3 % (ref 0.0–2.0)
Basophils Absolute: 0 10*3/uL (ref 0.0–0.1)
EOS ABS: 0 10*3/uL (ref 0.0–0.5)
EOS%: 0.6 % (ref 0.0–7.0)
HEMATOCRIT: 32.1 % — AB (ref 34.8–46.6)
HGB: 10.1 g/dL — ABNORMAL LOW (ref 11.6–15.9)
LYMPH%: 23.4 % (ref 14.0–49.7)
MCH: 33.8 pg (ref 25.1–34.0)
MCHC: 31.5 g/dL (ref 31.5–36.0)
MCV: 107.3 fL — ABNORMAL HIGH (ref 79.5–101.0)
MONO#: 0.3 10*3/uL (ref 0.1–0.9)
MONO%: 7.1 % (ref 0.0–14.0)
NEUT%: 68.6 % (ref 38.4–76.8)
NEUTROS ABS: 3.1 10*3/uL (ref 1.5–6.5)
PLATELETS: 168 10*3/uL (ref 145–400)
RBC: 2.99 10*6/uL — ABNORMAL LOW (ref 3.70–5.45)
RDW: 17.6 % — ABNORMAL HIGH (ref 11.2–14.5)
WBC: 4.5 10*3/uL (ref 3.9–10.3)
lymph#: 1 10*3/uL (ref 0.9–3.3)

## 2014-07-07 LAB — BASIC METABOLIC PANEL (CC13)
ANION GAP: 8 meq/L (ref 3–11)
BUN: 41.2 mg/dL — ABNORMAL HIGH (ref 7.0–26.0)
CALCIUM: 8.6 mg/dL (ref 8.4–10.4)
CO2: 25 meq/L (ref 22–29)
CREATININE: 8.1 mg/dL — AB (ref 0.6–1.1)
Chloride: 99 mEq/L (ref 98–109)
GLUCOSE: 73 mg/dL (ref 70–140)
Potassium: 4.9 mEq/L (ref 3.5–5.1)
Sodium: 132 mEq/L — ABNORMAL LOW (ref 136–145)

## 2014-07-07 MED ORDER — DEXTROSE 5 % IV SOLN
15.0000 mg/m2 | Freq: Once | INTRAVENOUS | Status: AC
  Administered 2014-07-07: 26 mg via INTRAVENOUS
  Filled 2014-07-07: qty 13

## 2014-07-07 MED ORDER — DEXAMETHASONE SODIUM PHOSPHATE 10 MG/ML IJ SOLN
10.0000 mg | Freq: Once | INTRAMUSCULAR | Status: AC
  Administered 2014-07-07: 10 mg via INTRAVENOUS

## 2014-07-07 MED ORDER — SODIUM CHLORIDE 0.9 % IV SOLN
Freq: Once | INTRAVENOUS | Status: AC
  Administered 2014-07-07: 11:00:00 via INTRAVENOUS

## 2014-07-07 MED ORDER — DEXAMETHASONE SODIUM PHOSPHATE 10 MG/ML IJ SOLN
INTRAMUSCULAR | Status: AC
  Filled 2014-07-07: qty 1

## 2014-07-07 MED ORDER — ONDANSETRON 8 MG/NS 50 ML IVPB
INTRAVENOUS | Status: AC
  Filled 2014-07-07: qty 8

## 2014-07-07 MED ORDER — ONDANSETRON 8 MG/50ML IVPB (CHCC)
8.0000 mg | Freq: Once | INTRAVENOUS | Status: AC
Start: 2014-07-07 — End: 2014-07-07
  Administered 2014-07-07: 8 mg via INTRAVENOUS

## 2014-07-07 MED ORDER — LENALIDOMIDE 2.5 MG PO CAPS: 2.5000 mg | ORAL_CAPSULE | Freq: Every day | ORAL | Status: DC

## 2014-07-07 MED ORDER — SODIUM CHLORIDE 0.9 % IV SOLN: Freq: Once | INTRAVENOUS | Status: DC

## 2014-07-07 NOTE — Patient Instructions (Signed)
[facility] Discharge Instructions for Patients Receiving Chemotherapy  Today you received the following chemotherapy agents: kyprolis  To help prevent nausea and vomiting after your treatment, we encourage you to take your nausea medication.  Take it as often as prescribed.     If you develop nausea and vomiting that is not controlled by your nausea medication, call the clinic. If it is after clinic hours your family physician or the after hours number for the clinic or go to the Emergency Department.   BELOW ARE SYMPTOMS THAT SHOULD BE REPORTED IMMEDIATELY:  *FEVER GREATER THAN 100.5 F  *CHILLS WITH OR WITHOUT FEVER  NAUSEA AND VOMITING THAT IS NOT CONTROLLED WITH YOUR NAUSEA MEDICATION  *UNUSUAL SHORTNESS OF BREATH  *UNUSUAL BRUISING OR BLEEDING  TENDERNESS IN MOUTH AND THROAT WITH OR WITHOUT PRESENCE OF ULCERS  *URINARY PROBLEMS  *BOWEL PROBLEMS  UNUSUAL RASH Items with * indicate a potential emergency and should be followed up as soon as possible.  Feel free to call the clinic you have any questions or concerns. The clinic phone number is [phone].   I have been informed and understand all the instructions given to me. I know to contact the clinic, my physician, or go to the Emergency Department if any problems should occur. I do not have any questions at this time, but understand that I may call the clinic during office hours   should I have any questions or need assistance in obtaining follow up care.    __________________________________________  _____________  __________ Signature of Patient or Authorized Representative            Date                   Time    __________________________________________ Nurse's Signature    

## 2014-07-07 NOTE — Telephone Encounter (Signed)
gv adn printed appt sched and avs for pt for OCT ...sed added tx...pt needed appts sched for transportation

## 2014-07-07 NOTE — Progress Notes (Signed)
Ok to treat with serum creatining 8.1 per Dr. Lona Kettle.   Pt on dialysis - 250 addnl IVF not given.

## 2014-07-08 LAB — IGG: IgG (Immunoglobin G), Serum: 3960 mg/dL — ABNORMAL HIGH (ref 690–1700)

## 2014-07-09 ENCOUNTER — Ambulatory Visit: Payer: Self-pay

## 2014-07-09 ENCOUNTER — Ambulatory Visit (HOSPITAL_BASED_OUTPATIENT_CLINIC_OR_DEPARTMENT_OTHER): Payer: Medicare Other

## 2014-07-09 ENCOUNTER — Telehealth: Payer: Self-pay | Admitting: Hematology

## 2014-07-09 VITALS — BP 114/63 | HR 58 | Temp 98.6°F

## 2014-07-09 DIAGNOSIS — Z5189 Encounter for other specified aftercare: Secondary | ICD-10-CM

## 2014-07-09 DIAGNOSIS — C9000 Multiple myeloma not having achieved remission: Secondary | ICD-10-CM

## 2014-07-09 DIAGNOSIS — D61818 Other pancytopenia: Secondary | ICD-10-CM

## 2014-07-09 MED ORDER — TBO-FILGRASTIM 480 MCG/0.8ML ~~LOC~~ SOSY
480.0000 ug | PREFILLED_SYRINGE | Freq: Once | SUBCUTANEOUS | Status: AC
  Administered 2014-07-09: 480 ug via SUBCUTANEOUS
  Filled 2014-07-09: qty 0.8

## 2014-07-09 NOTE — Telephone Encounter (Signed)
RECEIVED A FAX FROM [facility] CONCERNING A CONFIRMATION OF PRESCRIPTION SHIPMENT FOR REVLIMID ON 07/08/14.

## 2014-07-09 NOTE — Telephone Encounter (Signed)
lvm for pt transportation serv [phone] and lvm with pt appt d.t for all of OCT

## 2014-07-11 ENCOUNTER — Encounter: Payer: Self-pay | Admitting: Hematology

## 2014-07-11 NOTE — Progress Notes (Signed)
[place] OFFICE PROGRESS NOTE Date of Visit: 07/07/2014.   DIAGNOSIS: Multiple Myeloma for follow up, ESRD on HD T/T/S schedule.  CURRENT TREATMENT:  Carfilzomib 15 mg/m2 on days 1,3, 8, 10,15, 17 (her dialysis is on day 2); Dexamethasone 12 mg weekly on days 1, 8, 15, 22; revlimid 5 mg daily for 28 days.    Carfilzomib started on 03/29/2014.  She received it on 6/29 and 03/31/2014; Doses on 7/6 and 7/8 were held due to severe anemia. 7/13 and 7/15 she got it so 4 doses for 1st cycle.  Cycle 2 started 7/27 and 7/29, 8/5, 8/10 and 8/12 so she got 5 doses. With cycle 3 she got doses on 8/24 and 8/26. 8/31 Chemo held low ANC 500, on 06/09/14 we did not do chemo because ANC 700. She got 1 dose of neupogen 480 mcg sq. She continued the weekly Dexamethasone and Revlimid at 5 mg daily. Labs last week show persistent neutropenia so I am going held Revlimid also as of 06/14/14 and delayed chemo for another week.   She started Cycle 4 on 9/28 and 9/30. On 10/5 she did not come as she did not have transport and compliance has been an issue in past as well. I have added Neupogen every Friday on the weeks of her chemotherapy. She will get chemo today and Neupogen on Friday.  Labs wbc 4.5 hb 10.1 hct 32 plt 168   Na 132 k 4.9 cl 99 co2 25 glu 73 bun 41 cr 8.1  IgG   7700>>>4820>>>3920>>>3960     Multiple myeloma   03/30/2008 Initial Diagnosis Multiple myeloma   03/30/2008 - 05/01/2012 Chemotherapy Revlimid/Dex then Velcade (started on 07/13/2011 ending on 09/14/2011) without response.  Managed by Dr. Chauncey Fischer of [place], [place] [address]   07/24/2010 -  Chemotherapy Received zometa  intermittently over one year.  Creatinine was 1.0 on 07/24/2010. 1.7 on 08/10/2011.    05/01/2012 - 09/01/2012 Chemotherapy Starte Pomalidomide s/p 4 cycles. Referred to [facility] for consideration of ASCT.     10/08/2012 Bone Marrow Biopsy Normocellular marrow with residual/recurrently plasma cell  myeloma, 60-70 % of overall marrow celluarity.  Flow: c/w plasma cell neoplasm, plasma cell infiltrate comprises 60-70 % of overall marrow cellularity; FISH: + for 1 q21/CKS1B gain; RB1 (13q14)   10/08/2012 Bone Marrow Biopsy Continued... Cytogenetics showed normal female karyotype.    10/29/2012 Tumor Marker High IgG (6019), high lamda free light chain (409.32) with low IgA, IgM and kappa free light chain.    04/21/2013 Imaging Skeletal survey.  No suspcious lytic or blastic lesions visualized.  Severe multilevel degenerative disc disease in the cervical spine.    04/29/2013 Imaging PeT. No discrete suspicious focus of FDG uptake identified.    05/20/2013 - 06/09/2013 Chemotherapy Started carfilxomib 39 mg on 08/25, 08/26, day 8 (06/02/13), day 9 on 06/03/2013 and on 06/09/2013.     12/03/2013 Treatment Plan Change Last office visit with Dr. Glenna Durand.  She decline further chemotherapy.   [name]    03/11/2014 - 03/17/2014 Hospital Admission Admitted for low hemoglobin. Dr. Burney Gauze consulted.  SPEP,  UPEP and Bone marrow biopsy obtained.    IgG 11,300. M-spike 7.52, kappa lamda ratio 0 with lambda free light chains totaling 1260.    03/12/2014 Imaging Skeletal survey. 1. Small lytic lesions within the skull and right scapula. 2. Degenerative changes in the spine and knees, right  greater than left. 3. Cardiomegaly without pulmonary edema.   03/17/2014 Bone Marrow Biopsy Hypercellular bone marrow tih extensive involvment by plasma cell neoplasm (Plasma cells 85%). FISH: Presence of 2 copies of the IGH/FGFR3, ATM, CCND1/IGH, and p53 probes.  Loss of 13q34 probes in 40% of cells. gain of chromose 12 and either loss of chorm   03/25/2014 -  Chemotherapy Planning Kyprolis 15 mg/m2 plus dexamethasone weekly plus revlimid (50m) daily on 06/29.   06/14/2014 -  Chemotherapy Patient's last dose of Kyprolis was on 05/26/14. Since then chemo held due  to neutropenia despite GCSF support. I will hold Revlimid also as of 06/14/14   06/28/2014 -  Chemotherapy New cycle of Kyprolis starts today.   07/07/2014 Tumor Marker IgG 3960      INTERVAL HISTORY:  BLamica Mccart73y.o. female with a history of IgG Lambda Multiple myeloma complicated by non-compliance, ESRDz on H/D(Tues, Thursday and Sat) is here for follow up. She was initially consulted by [place] 03[place]17[place]. She was last seen by me on 06/09/14.  Today,she reports feeling much better with improved energy.  She reports seeing dentistry and completing a course of antibiotics with resolution of her dental pain.  She denies fevers or chills. She had a recent hospitalization (6/11 - 03/17/2014) due to profound anemia. She was transfused packed RBCs doing this admission.   She complains of swelling in her ankles. She undergoes dialysis every Tuesday, Thursday and Sat. Her revlimid dose was reduced 50%.  No new problems. She has been taking dexamethasone weekly and revlimid at 5 mg daily dose.    MEDICAL HISTORY: Past Medical History  Diagnosis Date  . Renal disorder   . ESRD (end stage renal disease)   . Hypertension   . Multiple myeloma 2009  . Thyroid disease   . Hyperparathyroidism   . Thrombocytopenia   . Anemia   . Shortness of breath   . Pneumonia   . GERD (gastroesophageal reflux disease)   . 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    ALLERGIES:  has No Known Allergies.  MEDICATIONS: has a current medication list which includes the following prescription(s): acetaminophen-codeine, acyclovir, amlodipine, aspirin ec, cyproheptadine, dexamethasone, furosemide, hydralazine, hydrocodone-homatropine, isosorbide mononitrate, metolazone, multiple vitamins-iron, ondansetron,  prochlorperazine, sevelamer carbonate, lenalidomide, and lorazepam.  SURGICAL HISTORY:  Past Surgical History  Procedure Laterality Date  . Abdominal hysterectomy      Pt. denies    REVIEW OF SYSTEMS:   Constitutional: Denies fevers, chills or abnormal weight loss. States that they pulled too much fluid off her during dialysis. Eyes: Denies blurriness of vision Ears, nose, mouth, throat, and face: Denies mucositis or sore throat Respiratory: Denies cough, dyspnea or wheezes Cardiovascular: Denies palpitation, chest discomfort or lower extremity swelling Gastrointestinal:  Denies nausea, heartburn or change in bowel habits Skin: Denies abnormal skin rashes Lymphatics: Denies new lymphadenopathy or easy bruising Neurological:Denies numbness, tingling or new weaknesses Behavioral/Psych: Mood is stable, no new changes  All other systems were reviewed with the patient and are negative.  PHYSICAL EXAMINATION: ECOG PERFORMANCE STATUS: 1  Blood pressure 124/81, pulse 57, temperature 98.8 F (37.1 C), temperature source Oral, resp. rate 18, height '5\' 7"'  (1.702 m), weight 144 lb 12.8 oz (65.681 kg).  GENERAL:alert, no distress and comfortable; well developed and  well nourished. Anxious SKIN: skin color, texture, turgor are normal, no rashes or significant lesions EYES: normal, Conjunctiva are pink and non-injected, sclera clear OROPHARYNX:no exudate, no erythema and lips, buccal mucosa, and tongue normal  NECK: supple, thyroid normal size, non-tender, without nodularity LYMPH:  no palpable lymphadenopathy in the cervical, axillary or supraclavicular LUNGS: clear to auscultation with normal breathing effort, no wheezes or rhonchi HEART: regular rate & rhythm and no murmurs and 1+ edema in ankles bilaterally; L AVF ABDOMEN:abdomen soft, non-tender and normal bowel sounds Musculoskeletal:no cyanosis of digits and no clubbing  NEURO: alert & oriented x 3 with fluent speech, no focal  motor/sensory deficits   RADIOGRAPHIC STUDIES:    Dg Bone Survey Met  03/12/2014   CLINICAL DATA:  Myeloma.  Right knee pain.  EXAM: METASTATIC BONE SURVEY  COMPARISON:  None.  FINDINGS: Multiple images are performed of the axial and appendicular skeleton.  Multiple small lytic lesions are identified within the skull consistent with myeloma. A small lytic lesion is identified within the right scapula.  Degenerative changes are identified within the mid cervical spine. No suspicious lytic or blastic lesions are identified within the spine, or long bones. Surgical clips are identified in the left upper arm. Degenerative changes are seen in the knees bilaterally, right greater than left.  The heart is mildly enlarged. There are areas of atelectasis within the right lung. No focal consolidations or pulmonary edema.  IMPRESSION: 1. Small lytic lesions within the skull and right scapula. 2. Degenerative changes in the spine and knees, right greater than left. 3. Cardiomegaly without pulmonary edema.   Electronically Signed   By: Shon Hale M.D.   On: 03/12/2014 10:13     ASSESSMENT: [name] 73 y.o. female with a history of Multiple Myeloma and ESRD on HD currently on a modified regimen of Dexamethasone, Revlimid and Carfilzomib. Chemo held last few weeks due to neutropenia from chemo and Revlimid. She has transport issues and on T/T/S she goes for hemodialysis. She will start cycle 4 today on 06/28/14 as counts have recovered. The platelets have already recovered and the white count is normal. She will continue dexamethasone which has anti-myeloma properties as well.She will resume low dose Revlimid today.  PLAN:   1. IgG Lambda MM, rapidly progressive.  --Her Bone marrow is packed with 85% plasma cells likely explaining her refractory anemia and pancytopenia. Her protein was 14.7 down to 12.5 to 10.3 to 3.9 grams  since starting chemotherapy. Her case has been difficult due to a history of  non-compliance. She stated that she stopped her recent treatment in [place] due to being hospitalized. Per records from her primary oncologist's office, it was likely due to her kidney failure due to progression of her multiple myeloma. Her last treatment is as noted above. Considering she received only one cycle of carfilzomib plus dexamethasone, we started her back on this regiment based on Nicole Kindred et. Al, NEJM, 2015).   *Carfilzomib (15 mg/m2) on days 1, 3, 8, 10, 15, 17  *Lenalidomide 38m daily (please take follow H/D on Tue/th/Sa). --dose reduced by 50% due to delayed count recovery. Revlimid is now being held as of today 06/14/14 *Dexamethasone 12 mg weekly  We presented the treatment indications, benefits and side-effects including but not limited to myelosuprresion which can result in life-threatening infections and anemia and low plts, and nausea or vomiting or rashes. Continue acyclovir for antiviral prophylaxis.  She understood the indications, risks and benefits and chose to proceed. She started  therapy on 03/29/2014. She received 4 total doses of carfilzomib for cycle #1 and a total of 5 doses for cycle #2.  We will obtain Multiple myeloma markers monthly starting on 08/24.  Markers from last month revealed a decrease in her M-spike. Her IgG level on 05/24/2014 was down to 4.8 grams (4820 mg) from 11.3 grams (11300 mg) in June 2015. Last week her IgG WAS 3920 mg.  She is on cycle 4 now week 2 missed dose on Monday did not show up but will get carfilzomib today and neupogen on Friday.  2. Pancytopenia with anemia likely multifactorial.  --Secondary to #1. We will start on aranesp biweekly if not provided with her H/D.  She cannot have transfusions on the days of her dialysis (Tues, Thurs and Sat). Her ANC is 2200. She was advised extensively on symptoms of Neutropenia.  Her hemoglobin is 10.5. Her plts are up to 179.  3. ESRDz.  --Likely secondary to #1. Continue hemodialysis on Tues, Thursday  and Sat.   4. Psuedohyponatremia.  --Likely due to #1. Treat underlying disorder. Her sodium is stable at 133.   5. History of Noncompliance.  -- Ongoing problem but we are trying to work with her.  6. Lower extremity swelling. --.  May require additional fluid pulled off with h/d. Lasix prn.  7. Poor nutrition. --She has had dental pain with resolution which may in part explain some weight lost.  We will refer to nutrition with consideration for nutritional supplementation with boost and/or ensure. She will be referred to oral surgeon. She is not on bisphosphonate currently.  8. Follow up.  --Patient will follow up in our office next week so we can check her counts. She will get a Neupogen every Friday after getting 2 days of chemo each week of chemo.  All questions were answered. The patient knows to call the clinic with any problems, questions or concerns. We can certainly see the patient much sooner if necessary. Lasix prescription was e-scripted today.  I spent 20 minutes counseling the patient face to face. The total time spent in the appointment was 25 minutes.    Bernadene Bell, MD Medical Hematologist/Oncologist [place] Pager: [phone] Office No: [phone]

## 2014-07-12 ENCOUNTER — Ambulatory Visit (HOSPITAL_BASED_OUTPATIENT_CLINIC_OR_DEPARTMENT_OTHER): Payer: Medicare Other | Admitting: Hematology

## 2014-07-12 ENCOUNTER — Other Ambulatory Visit (HOSPITAL_BASED_OUTPATIENT_CLINIC_OR_DEPARTMENT_OTHER): Payer: Medicare Other

## 2014-07-12 ENCOUNTER — Ambulatory Visit (HOSPITAL_BASED_OUTPATIENT_CLINIC_OR_DEPARTMENT_OTHER): Payer: Medicare Other

## 2014-07-12 ENCOUNTER — Telehealth: Payer: Self-pay | Admitting: Hematology

## 2014-07-12 ENCOUNTER — Encounter: Payer: Self-pay | Admitting: Hematology

## 2014-07-12 VITALS — BP 130/78 | HR 58 | Temp 98.0°F | Resp 18

## 2014-07-12 DIAGNOSIS — E875 Hyperkalemia: Secondary | ICD-10-CM

## 2014-07-12 DIAGNOSIS — Z5112 Encounter for antineoplastic immunotherapy: Secondary | ICD-10-CM

## 2014-07-12 DIAGNOSIS — D61818 Other pancytopenia: Secondary | ICD-10-CM

## 2014-07-12 DIAGNOSIS — C9000 Multiple myeloma not having achieved remission: Secondary | ICD-10-CM

## 2014-07-12 DIAGNOSIS — N186 End stage renal disease: Secondary | ICD-10-CM

## 2014-07-12 LAB — CBC WITH DIFFERENTIAL/PLATELET
BASO%: 0.3 % (ref 0.0–2.0)
Basophils Absolute: 0 10*3/uL (ref 0.0–0.1)
EOS%: 1.7 % (ref 0.0–7.0)
Eosinophils Absolute: 0.1 10*3/uL (ref 0.0–0.5)
HCT: 32.8 % — ABNORMAL LOW (ref 34.8–46.6)
HGB: 10.3 g/dL — ABNORMAL LOW (ref 11.6–15.9)
LYMPH%: 20.7 % (ref 14.0–49.7)
MCH: 33.6 pg (ref 25.1–34.0)
MCHC: 31.3 g/dL — AB (ref 31.5–36.0)
MCV: 107.1 fL — ABNORMAL HIGH (ref 79.5–101.0)
MONO#: 0.4 10*3/uL (ref 0.1–0.9)
MONO%: 6.5 % (ref 0.0–14.0)
NEUT#: 4.8 10*3/uL (ref 1.5–6.5)
NEUT%: 70.8 % (ref 38.4–76.8)
PLATELETS: 218 10*3/uL (ref 145–400)
RBC: 3.06 10*6/uL — AB (ref 3.70–5.45)
RDW: 16.6 % — ABNORMAL HIGH (ref 11.2–14.5)
WBC: 6.8 10*3/uL (ref 3.9–10.3)
lymph#: 1.4 10*3/uL (ref 0.9–3.3)

## 2014-07-12 LAB — COMPREHENSIVE METABOLIC PANEL (CC13)
ALK PHOS: 57 U/L (ref 40–150)
ALT: 14 U/L (ref 0–55)
AST: 16 U/L (ref 5–34)
Albumin: 2.9 g/dL — ABNORMAL LOW (ref 3.5–5.0)
Anion Gap: 8 mEq/L (ref 3–11)
BUN: 54 mg/dL — ABNORMAL HIGH (ref 7.0–26.0)
CO2: 23 mEq/L (ref 22–29)
CREATININE: 8.9 mg/dL — AB (ref 0.6–1.1)
Calcium: 9.8 mg/dL (ref 8.4–10.4)
Chloride: 99 mEq/L (ref 98–109)
Glucose: 77 mg/dl (ref 70–140)
Potassium: 5.6 mEq/L — ABNORMAL HIGH (ref 3.5–5.1)
SODIUM: 130 meq/L — AB (ref 136–145)
TOTAL PROTEIN: 9.6 g/dL — AB (ref 6.4–8.3)
Total Bilirubin: 0.5 mg/dL (ref 0.20–1.20)

## 2014-07-12 MED ORDER — ONDANSETRON 8 MG/NS 50 ML IVPB
INTRAVENOUS | Status: AC
  Filled 2014-07-12: qty 8

## 2014-07-12 MED ORDER — SODIUM CHLORIDE 0.9 % IV SOLN
Freq: Once | INTRAVENOUS | Status: AC
  Administered 2014-07-12: 12:00:00 via INTRAVENOUS

## 2014-07-12 MED ORDER — DEXAMETHASONE SODIUM PHOSPHATE 10 MG/ML IJ SOLN
INTRAMUSCULAR | Status: AC
  Filled 2014-07-12: qty 1

## 2014-07-12 MED ORDER — ONDANSETRON 8 MG/50ML IVPB (CHCC)
8.0000 mg | Freq: Once | INTRAVENOUS | Status: AC
  Administered 2014-07-12: 8 mg via INTRAVENOUS

## 2014-07-12 MED ORDER — DEXTROSE 5 % IV SOLN
15.0000 mg/m2 | Freq: Once | INTRAVENOUS | Status: AC
  Administered 2014-07-12: 26 mg via INTRAVENOUS
  Filled 2014-07-12: qty 13

## 2014-07-12 MED ORDER — SODIUM CHLORIDE 0.9 % IV SOLN
Freq: Once | INTRAVENOUS | Status: AC
  Administered 2014-07-12: 13:00:00 via INTRAVENOUS

## 2014-07-12 MED ORDER — DEXAMETHASONE SODIUM PHOSPHATE 10 MG/ML IJ SOLN
10.0000 mg | Freq: Once | INTRAMUSCULAR | Status: AC
  Administered 2014-07-12: 10 mg via INTRAVENOUS

## 2014-07-12 NOTE — Progress Notes (Signed)
[name] OFFICE PROGRESS NOTE Date of Visit: 07/12/2014   DIAGNOSIS: Multiple Myeloma for follow up, ESRD on HD T/T/S schedule.  CURRENT TREATMENT:  Carfilzomib 15 mg/m2 on days 1,3, 8, 10,15, 17 (her dialysis is on day 2); Dexamethasone 12 mg weekly on days 1, 8, 15, 22; revlimid 5 mg daily for 28 days.    Carfilzomib started on 03/29/2014.  She received it on 6/29 and 03/31/2014; Doses on 7/6 and 7/8 were held due to severe anemia. 7/13 and 7/15 she got it so 4 doses for 1st cycle.  Cycle 2 started 7/27 and 7/29, 8/5, 8/10 and 8/12 so she got 5 doses. With cycle 3 she got doses on 8/24 and 8/26. 8/31 Chemo held low ANC 500, on 06/09/14 we did not do chemo because ANC 700. She got 1 dose of neupogen 480 mcg sq. She continued the weekly Dexamethasone and Revlimid at 5 mg daily. Labs last week show persistent neutropenia so I am going held Revlimid also as of 06/14/14 and delayed chemo for another week.   She started Cycle 4 on 9/28 and 9/30. On 10/5 she did not come as she did not have transport and compliance has been an issue in past as well. I have added Neupogen every Friday on the weeks of her chemotherapy. She will get chemo today and Neupogen on Friday.  She will get Carfilzomib today and on 10/14 and neupogen shot on 10/16. Next week she will be off therapy and we will check her myeloma markers again on 07/23/14.  LABS REVIEWED TODAY prior to chemotherapy:           Multiple myeloma   03/30/2008 Initial Diagnosis Multiple myeloma   03/30/2008 - 05/01/2012 Chemotherapy Revlimid/Dex then Velcade (started on 07/13/2011 ending on 09/14/2011) without response.  Managed by Dr. Chauncey Fischer of [place], [place] [address]   07/24/2010 -  Chemotherapy Received zometa  intermittently over one year.  Creatinine was 1.0 on 07/24/2010. 1.7 on 08/10/2011.    05/01/2012 - 09/01/2012 Chemotherapy Starte Pomalidomide s/p 4 cycles. Referred to [facility] for consideration of  ASCT.     10/08/2012 Bone Marrow Biopsy Normocellular marrow with residual/recurrently plasma cell myeloma, 60-70 % of overall marrow celluarity.  Flow: c/w plasma cell neoplasm, plasma cell infiltrate comprises 60-70 % of overall marrow cellularity; FISH: + for 1 q21/CKS1B gain; RB1 (13q14)   10/08/2012 Bone Marrow Biopsy Continued... Cytogenetics showed normal female karyotype.    10/29/2012 Tumor Marker High IgG (6019), high lamda free light chain (409.32) with low IgA, IgM and kappa free light chain.    04/21/2013 Imaging Skeletal survey.  No suspcious lytic or blastic lesions visualized.  Severe multilevel degenerative disc disease in the cervical spine.    04/29/2013 Imaging PeT. No discrete suspicious focus of FDG uptake identified.    05/20/2013 - 06/09/2013 Chemotherapy Started carfilxomib 39 mg on 08/25, 08/26, day 8 (06/02/13), day 9 on 06/03/2013 and on 06/09/2013.     12/03/2013 Treatment Plan Change Last office visit with Dr. Glenna Durand.  She decline further chemotherapy.   [name]    03/11/2014 - 03/17/2014 Hospital Admission Admitted for low hemoglobin. Dr. Burney Gauze consulted.  SPEP,  UPEP and Bone marrow biopsy obtained.    IgG 11,300. M-spike 7.52, kappa lamda ratio 0 with lambda free light chains totaling 1260.    03/12/2014 Imaging Skeletal survey. 1. Small lytic lesions within the  skull and right scapula. 2. Degenerative changes in the spine and knees, right greater than left. 3. Cardiomegaly without pulmonary edema.   03/17/2014 Bone Marrow Biopsy Hypercellular bone marrow tih extensive involvment by plasma cell neoplasm (Plasma cells 85%). FISH: Presence of 2 copies of the IGH/FGFR3, ATM, CCND1/IGH, and p53 probes.  Loss of 13q34 probes in 40% of cells. gain of chromose 12 and either loss of chorm   03/25/2014 -  Chemotherapy Planning Kyprolis 15 mg/m2 plus dexamethasone weekly plus revlimid (42m) daily on 06/29.    06/14/2014 -  Chemotherapy Patient's last dose of Kyprolis was on 05/26/14. Since then chemo held due to neutropenia despite GCSF support. I will hold Revlimid also as of 06/14/14   06/28/2014 -  Chemotherapy New cycle of Kyprolis starts today.   07/07/2014 Tumor Marker IgG 3960      INTERVAL HISTORY:  BZahria Ding89y.o. female with a history of IgG Lambda Multiple myeloma complicated by non-compliance, ESRDz on H/D(Tues, Thursday and Sat) is here for follow up. She was initially consulted by [place] 03/17/14. She was last seen by me on 06/09/14.  Today,she reports feeling much better with improved energy.  She reports seeing dentistry and completing a course of antibiotics with resolution of her dental pain.  She denies fevers or chills. She had a recent hospitalization (6/11 - 03/17/2014) due to profound anemia. She was transfused packed RBCs doing this admission.   She complains of swelling in her ankles. She undergoes dialysis every Tuesday, Thursday and Sat. Her revlimid dose was reduced 50%.  No new problems. She has been taking dexamethasone weekly and revlimid at 5 mg daily dose. Patient is otherwise doing well. K+ is 5.5 but she gets HD tomorrow.   MEDICAL HISTORY: Past Medical History  Diagnosis Date  . Renal disorder   . ESRD (end stage renal disease)   . Hypertension   . Multiple myeloma 2009  . Thyroid disease   . Hyperparathyroidism   . Thrombocytopenia   . Anemia   . Shortness of breath   . Pneumonia   . GERD (gastroesophageal reflux disease)   . 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    ALLERGIES:  has No Known Allergies.  MEDICATIONS: has a current medication list which includes the following prescription(s): acetaminophen-codeine, acyclovir, amlodipine,  aspirin ec, cyproheptadine, dexamethasone, furosemide, hydralazine, hydrocodone-homatropine, isosorbide mononitrate, lenalidomide, lorazepam, metolazone, multiple vitamins-iron, ondansetron, prochlorperazine, and sevelamer carbonate.  SURGICAL HISTORY:  Past Surgical History  Procedure Laterality Date  . Abdominal hysterectomy      Pt. denies    REVIEW OF SYSTEMS:   Constitutional: Denies fevers, chills or abnormal weight loss. States that they pulled too much fluid off her during dialysis. Eyes: Denies blurriness of vision Ears, nose, mouth, throat, and face: Denies mucositis or sore throat Respiratory: Denies cough, dyspnea or wheezes Cardiovascular: Denies palpitation, chest discomfort or lower extremity swelling Gastrointestinal:  Denies nausea, heartburn or change in bowel habits Skin: Denies abnormal skin rashes Lymphatics: Denies new lymphadenopathy or easy bruising Neurological:Denies numbness, tingling or new weaknesses Behavioral/Psych: Mood is stable, no new changes  All other systems were reviewed with the patient and are negative.  PHYSICAL EXAMINATION:     ECOG PEFORMANCE STATUS: 1  GENERAL:alert, no distress and comfortable; well developed and  well nourished. Anxious SKIN: skin color, texture, turgor are normal, no rashes or significant lesions EYES: normal, Conjunctiva are pink and non-injected, sclera clear OROPHARYNX:no exudate, no erythema and lips, buccal mucosa, and tongue normal  NECK: supple, thyroid normal size, non-tender, without nodularity LYMPH:  no palpable lymphadenopathy in the cervical, axillary or supraclavicular LUNGS: clear to auscultation with normal breathing effort, no wheezes or rhonchi HEART: regular rate & rhythm and no murmurs and 1+ edema in ankles bilaterally; L AVF ABDOMEN:abdomen soft, non-tender and normal bowel sounds Musculoskeletal:no cyanosis of digits and no clubbing  NEURO: alert & oriented x 3 with fluent speech, no focal  motor/sensory deficits   RADIOGRAPHIC STUDIES:    Dg Bone Survey Met  03/12/2014   CLINICAL DATA:  Myeloma.  Right knee pain.  EXAM: METASTATIC BONE SURVEY  COMPARISON:  None.  FINDINGS: Multiple images are performed of the axial and appendicular skeleton.  Multiple small lytic lesions are identified within the skull consistent with myeloma. A small lytic lesion is identified within the right scapula.  Degenerative changes are identified within the mid cervical spine. No suspicious lytic or blastic lesions are identified within the spine, or long bones. Surgical clips are identified in the left upper arm. Degenerative changes are seen in the knees bilaterally, right greater than left.  The heart is mildly enlarged. There are areas of atelectasis within the right lung. No focal consolidations or pulmonary edema.  IMPRESSION: 1. Small lytic lesions within the skull and right scapula. 2. Degenerative changes in the spine and knees, right greater than left. 3. Cardiomegaly without pulmonary edema.   Electronically Signed   By: Shon Hale M.D.   On: 03/12/2014 10:13     ASSESSMENT: [name] 73 y.o. female with a history of Multiple Myeloma and ESRD on HD currently on a modified regimen of Dexamethasone, Revlimid and Carfilzomib. Chemo held last few weeks due to neutropenia from chemo and Revlimid. She has transport issues and on T/T/S she goes for hemodialysis. She will start cycle 4 today on 06/28/14 as counts have recovered. The platelets have already recovered and the white count is normal. She will continue dexamethasone which has anti-myeloma properties as well.She will resume low dose Revlimid today.  PLAN:   1. IgG Lambda MM, rapidly progressive.  --Her Bone marrow is packed with 85% plasma cells likely explaining her refractory anemia and pancytopenia. Her protein was 14.7 down to 12.5 to 10.3 to 3.9 grams  since starting chemotherapy. Her case has been difficult due to a history of  non-compliance. She stated that she stopped her recent treatment in [place] due to being hospitalized. Per records from her primary oncologist's office, it was likely due to her kidney failure due to progression of her multiple myeloma. Her last treatment is as noted above. Considering she received only one cycle of carfilzomib plus dexamethasone, we started her back on this regiment based on Nicole Kindred et. Al, NEJM, 2015).   *Carfilzomib (15 mg/m2) on days 1, 3, 8, 10, 15, 17  *Lenalidomide 46m daily (please take follow H/D on Tue/th/Sa). --dose reduced by 50% due to delayed count recovery. Revlimid is now being held as of today 06/14/14 *Dexamethasone 12 mg weekly  We presented the treatment indications, benefits and side-effects including but not limited to myelosuprresion which can result in life-threatening infections and anemia and low plts, and nausea or vomiting or rashes. Continue acyclovir for antiviral prophylaxis.  She understood the indications, risks and benefits and chose to proceed. She started  therapy on 03/29/2014. She received 4 total doses of carfilzomib for cycle #1 and a total of 5 doses for cycle #2.  We will obtain Multiple myeloma markers monthly starting on 08/24.  Markers from last month revealed a decrease in her M-spike. Her IgG level on 05/24/2014 was down to 4.8 grams (4820 mg) from 11.3 grams (11300 mg) in June 2015. Last week her IgG WAS 3920 mg.  She is on cycle 4 now week 2 missed dose on Monday did not show up but will get carfilzomib today and neupogen on Friday. She will get two dose of Carfilzomib this week and neupogen on Friday to complete this cycle and we will see her again next week on 07/23/14.  2. Pancytopenia with anemia likely multifactorial.  --Secondary to #1. We will start on aranesp biweekly if not provided with her H/D.  She cannot have transfusions on the days of her dialysis (Tues, Thurs and Sat). Her ANC is 4800. She was advised extensively on symptoms of  Neutropenia.  Her hemoglobin is 10.5. Her plts are up to 179.  3. ESRDz.  --Likely secondary to #1. Continue hemodialysis on Tues, Thursday and Sat.   4. Psuedohyponatremia.  --Likely due to #1. Treat underlying disorder. Her sodium is stable at 133.   5. History of Noncompliance.  -- Ongoing problem but we are trying to work with her.  6. Lower extremity swelling. --.  May require additional fluid pulled off with h/d. Lasix prn.  7. Poor nutrition. --She has had dental pain with resolution which may in part explain some weight lost.  We will refer to nutrition with consideration for nutritional supplementation with boost and/or ensure. She will be referred to oral surgeon. She is not on bisphosphonate currently.  8. Hyperkalemia. --HD Tomorrow k+ 5.5.  9. Follow up.  --Patient will follow up in our office next week on 07/23/14 so we can check her counts.   All questions were answered. The patient knows to call the clinic with any problems, questions or concerns. We can certainly see the patient much sooner if necessary. Lasix prescription was e-scripted today.  I spent 20 minutes counseling the patient face to face. The total time spent in the appointment was 25 minutes.    Bernadene Bell, MD Medical Hematologist/Oncologist [place] Pager: [phone] Office No: [phone]

## 2014-07-12 NOTE — Telephone Encounter (Signed)
[facility] APPT SCHEDULE FOR OCT °

## 2014-07-12 NOTE — Patient Instructions (Signed)
[facility] Discharge Instructions for Patients Receiving Chemotherapy  Today you received the following chemotherapy agents Kyprolis.  To help prevent nausea and vomiting after your treatment, we encourage you to take your nausea medication.   If you develop nausea and vomiting that is not controlled by your nausea medication, call the clinic.   BELOW ARE SYMPTOMS THAT SHOULD BE REPORTED IMMEDIATELY:  *FEVER GREATER THAN 100.5 F  *CHILLS WITH OR WITHOUT FEVER  NAUSEA AND VOMITING THAT IS NOT CONTROLLED WITH YOUR NAUSEA MEDICATION  *UNUSUAL SHORTNESS OF BREATH  *UNUSUAL BRUISING OR BLEEDING  TENDERNESS IN MOUTH AND THROAT WITH OR WITHOUT PRESENCE OF ULCERS  *URINARY PROBLEMS  *BOWEL PROBLEMS  UNUSUAL RASH Items with * indicate a potential emergency and should be followed up as soon as possible.  Feel free to call the clinic you have any questions or concerns. The clinic phone number is [phone].    

## 2014-07-13 ENCOUNTER — Ambulatory Visit: Payer: Self-pay

## 2014-07-14 ENCOUNTER — Ambulatory Visit: Payer: Self-pay

## 2014-07-16 ENCOUNTER — Ambulatory Visit (HOSPITAL_BASED_OUTPATIENT_CLINIC_OR_DEPARTMENT_OTHER): Payer: Medicare Other

## 2014-07-16 ENCOUNTER — Telehealth: Payer: Self-pay | Admitting: Hematology

## 2014-07-16 ENCOUNTER — Encounter: Payer: Self-pay | Admitting: Medical Oncology

## 2014-07-16 ENCOUNTER — Ambulatory Visit: Payer: Medicare Other

## 2014-07-16 VITALS — BP 110/64 | HR 61 | Temp 97.9°F | Resp 18

## 2014-07-16 DIAGNOSIS — C9000 Multiple myeloma not having achieved remission: Secondary | ICD-10-CM

## 2014-07-16 DIAGNOSIS — Z5112 Encounter for antineoplastic immunotherapy: Secondary | ICD-10-CM

## 2014-07-16 MED ORDER — SODIUM CHLORIDE 0.9 % IV SOLN
Freq: Once | INTRAVENOUS | Status: AC
  Administered 2014-07-16: 14:00:00 via INTRAVENOUS

## 2014-07-16 MED ORDER — ONDANSETRON 8 MG/NS 50 ML IVPB
INTRAVENOUS | Status: AC
  Filled 2014-07-16: qty 8

## 2014-07-16 MED ORDER — ONDANSETRON 8 MG/50ML IVPB (CHCC)
8.0000 mg | Freq: Once | INTRAVENOUS | Status: AC
Start: 2014-07-16 — End: 2014-07-16
  Administered 2014-07-16: 8 mg via INTRAVENOUS

## 2014-07-16 MED ORDER — DEXAMETHASONE SODIUM PHOSPHATE 10 MG/ML IJ SOLN
INTRAMUSCULAR | Status: AC
  Filled 2014-07-16: qty 1

## 2014-07-16 MED ORDER — DEXTROSE 5 % IV SOLN
15.0000 mg/m2 | Freq: Once | INTRAVENOUS | Status: AC
  Administered 2014-07-16: 26 mg via INTRAVENOUS
  Filled 2014-07-16: qty 13

## 2014-07-16 MED ORDER — DEXAMETHASONE SODIUM PHOSPHATE 10 MG/ML IJ SOLN
10.0000 mg | Freq: Once | INTRAMUSCULAR | Status: AC
  Administered 2014-07-16: 10 mg via INTRAVENOUS

## 2014-07-16 NOTE — Patient Instructions (Signed)
[facility] Discharge Instructions for Patients Receiving Chemotherapy  Today you received the following chemotherapy agents:  kyprolis  To help prevent nausea and vomiting after your treatment, we encourage you to take your nausea medication   If you develop nausea and vomiting that is not controlled by your nausea medication, call the clinic.   BELOW ARE SYMPTOMS THAT SHOULD BE REPORTED IMMEDIATELY:  *FEVER GREATER THAN 100.5 F  *CHILLS WITH OR WITHOUT FEVER  NAUSEA AND VOMITING THAT IS NOT CONTROLLED WITH YOUR NAUSEA MEDICATION  *UNUSUAL SHORTNESS OF BREATH  *UNUSUAL BRUISING OR BLEEDING  TENDERNESS IN MOUTH AND THROAT WITH OR WITHOUT PRESENCE OF ULCERS  *URINARY PROBLEMS  *BOWEL PROBLEMS  UNUSUAL RASH Items with * indicate a potential emergency and should be followed up as soon as possible.  Feel free to call the clinic you have any questions or concerns. The clinic phone number is [phone].

## 2014-07-16 NOTE — Telephone Encounter (Signed)
called pt transportation again and confirmed appts recieved.Marland KitchenMarland KitchenMarland Kitchen

## 2014-07-16 NOTE — Progress Notes (Signed)
Pt came in today to let us know that transportation did not pick her up Wed 10/14 for her chemo. [place] in scheduling called to see if pt can get her treatment today or do we need to skip that treatment and do the neupogen. Dr. Lindi Adie- on call MD states that patients counts have been good and if patient can stay he thinks she should get the kyprolis and skip the neupogen. Pt is able to stay and appointment made in the infusion room. I did speak with the patient and she know we will no be doing the neupogen.

## 2014-07-19 ENCOUNTER — Other Ambulatory Visit: Payer: Self-pay

## 2014-07-19 ENCOUNTER — Ambulatory Visit: Payer: Self-pay

## 2014-07-21 ENCOUNTER — Ambulatory Visit: Payer: Self-pay

## 2014-07-22 ENCOUNTER — Other Ambulatory Visit: Payer: Self-pay

## 2014-07-22 ENCOUNTER — Ambulatory Visit: Payer: Self-pay

## 2014-07-23 ENCOUNTER — Other Ambulatory Visit: Payer: Self-pay

## 2014-07-23 ENCOUNTER — Other Ambulatory Visit (HOSPITAL_BASED_OUTPATIENT_CLINIC_OR_DEPARTMENT_OTHER): Payer: Medicare Other

## 2014-07-23 ENCOUNTER — Ambulatory Visit: Payer: Self-pay

## 2014-07-23 ENCOUNTER — Ambulatory Visit (HOSPITAL_BASED_OUTPATIENT_CLINIC_OR_DEPARTMENT_OTHER): Payer: Medicare Other | Admitting: Physician Assistant

## 2014-07-23 ENCOUNTER — Ambulatory Visit: Payer: Self-pay | Admitting: Physician Assistant

## 2014-07-23 VITALS — BP 108/63 | HR 54 | Temp 98.4°F | Resp 18 | Ht 67.0 in | Wt 137.4 lb

## 2014-07-23 DIAGNOSIS — Z9119 Patient's noncompliance with other medical treatment and regimen: Secondary | ICD-10-CM

## 2014-07-23 DIAGNOSIS — R609 Edema, unspecified: Secondary | ICD-10-CM

## 2014-07-23 DIAGNOSIS — Z23 Encounter for immunization: Secondary | ICD-10-CM

## 2014-07-23 DIAGNOSIS — D644 Congenital dyserythropoietic anemia: Secondary | ICD-10-CM

## 2014-07-23 DIAGNOSIS — N186 End stage renal disease: Secondary | ICD-10-CM

## 2014-07-23 DIAGNOSIS — C9000 Multiple myeloma not having achieved remission: Secondary | ICD-10-CM

## 2014-07-23 DIAGNOSIS — D61818 Other pancytopenia: Secondary | ICD-10-CM

## 2014-07-23 DIAGNOSIS — E871 Hypo-osmolality and hyponatremia: Secondary | ICD-10-CM

## 2014-07-23 DIAGNOSIS — Z992 Dependence on renal dialysis: Secondary | ICD-10-CM

## 2014-07-23 DIAGNOSIS — E875 Hyperkalemia: Secondary | ICD-10-CM

## 2014-07-23 LAB — CBC WITH DIFFERENTIAL/PLATELET
BASO%: 0.2 % (ref 0.0–2.0)
Basophils Absolute: 0 10*3/uL (ref 0.0–0.1)
EOS ABS: 0.2 10*3/uL (ref 0.0–0.5)
EOS%: 3.5 % (ref 0.0–7.0)
HEMATOCRIT: 33.9 % — AB (ref 34.8–46.6)
HGB: 10.6 g/dL — ABNORMAL LOW (ref 11.6–15.9)
LYMPH%: 31.9 % (ref 14.0–49.7)
MCH: 33 pg (ref 25.1–34.0)
MCHC: 31.3 g/dL — AB (ref 31.5–36.0)
MCV: 105.6 fL — ABNORMAL HIGH (ref 79.5–101.0)
MONO#: 0.5 10*3/uL (ref 0.1–0.9)
MONO%: 9.8 % (ref 0.0–14.0)
NEUT#: 2.5 10*3/uL (ref 1.5–6.5)
NEUT%: 54.6 % (ref 38.4–76.8)
PLATELETS: 145 10*3/uL (ref 145–400)
RBC: 3.21 10*6/uL — ABNORMAL LOW (ref 3.70–5.45)
RDW: 16.4 % — ABNORMAL HIGH (ref 11.2–14.5)
WBC: 4.6 10*3/uL (ref 3.9–10.3)
lymph#: 1.5 10*3/uL (ref 0.9–3.3)

## 2014-07-23 LAB — COMPREHENSIVE METABOLIC PANEL (CC13)
ALK PHOS: 45 U/L (ref 40–150)
ALT: 11 U/L (ref 0–55)
AST: 14 U/L (ref 5–34)
Albumin: 2.9 g/dL — ABNORMAL LOW (ref 3.5–5.0)
Anion Gap: 9 mEq/L (ref 3–11)
BILIRUBIN TOTAL: 0.51 mg/dL (ref 0.20–1.20)
BUN: 44.2 mg/dL — ABNORMAL HIGH (ref 7.0–26.0)
CO2: 23 mEq/L (ref 22–29)
CREATININE: 7.4 mg/dL — AB (ref 0.6–1.1)
Calcium: 9 mg/dL (ref 8.4–10.4)
Chloride: 100 mEq/L (ref 98–109)
GLUCOSE: 82 mg/dL (ref 70–140)
Potassium: 5.2 mEq/L — ABNORMAL HIGH (ref 3.5–5.1)
SODIUM: 133 meq/L — AB (ref 136–145)
TOTAL PROTEIN: 8.8 g/dL — AB (ref 6.4–8.3)

## 2014-07-23 MED ORDER — INFLUENZA VAC SPLIT QUAD 0.5 ML IM SUSY
0.5000 mL | PREFILLED_SYRINGE | Freq: Once | INTRAMUSCULAR | Status: AC
  Administered 2014-07-23: 0.5 mL via INTRAMUSCULAR
  Filled 2014-07-23: qty 0.5

## 2014-07-23 NOTE — Patient Instructions (Addendum)

## 2014-07-25 NOTE — Progress Notes (Signed)
[facility] OFFICE PROGRESS NOTE Date of Visit: 07/12/2014   DIAGNOSIS: Multiple Myeloma for follow up, ESRD on HD T/T/S schedule.  CURRENT TREATMENT:  Carfilzomib 15 mg/m2 on days 1,3, 8, 10,15, 17 (her dialysis is on day 2); Dexamethasone 12 mg weekly on days 1, 8, 15, 22; revlimid 5 mg daily for 28 days.    Carfilzomib started on 03/29/2014.  She received it on 6/29 and 03/31/2014; Doses on 7/6 and 7/8 were held due to severe anemia. 7/13 and 7/15 she got it so 4 doses for 1st cycle.  Cycle 2 started 7/27 and 7/29, 8/5, 8/10 and 8/12 so she got 5 doses. With cycle 3 she got doses on 8/24 and 8/26. 8/31 Chemo held low ANC 500, on 06/09/14 we did not do chemo because ANC 700. She got 1 dose of neupogen 480 mcg sq. She continued the weekly Dexamethasone and Revlimid at 5 mg daily. Labs last week show persistent neutropenia so I am going held Revlimid also as of 06/14/14 and delayed chemo for another week.   She started Cycle 4 on 9/28 and 9/30. On 10/5 she did not come as she did not have transport and compliance has been an issue in past as well. I have added Neupogen every Friday on the weeks of her chemotherapy. She will get chemo today and Neupogen on Friday.  She will due for Carfilzomib again starting on 10/26. Myeloma markers will be drawn again on 07/23/14.      Multiple myeloma   03/30/2008 Initial Diagnosis Multiple myeloma   03/30/2008 - 05/01/2012 Chemotherapy Revlimid/Dex then Velcade (started on 07/13/2011 ending on 09/14/2011) without response.  Managed by Dr. Chauncey Fischer of [place], [place] [address]   07/24/2010 -  Chemotherapy Received zometa  intermittently over one year.  Creatinine was 1.0 on 07/24/2010. 1.7 on 08/10/2011.    05/01/2012 - 09/01/2012 Chemotherapy Starte Pomalidomide s/p 4 cycles. Referred to [facility] for consideration of ASCT.     10/08/2012 Bone Marrow Biopsy Normocellular marrow with residual/recurrently plasma cell myeloma, 60-70 % of  overall marrow celluarity.  Flow: c/w plasma cell neoplasm, plasma cell infiltrate comprises 60-70 % of overall marrow cellularity; FISH: + for 1 q21/CKS1B gain; RB1 (13q14)   10/08/2012 Bone Marrow Biopsy Continued... Cytogenetics showed normal female karyotype.    10/29/2012 Tumor Marker High IgG (6019), high lamda free light chain (409.32) with low IgA, IgM and kappa free light chain.    04/21/2013 Imaging Skeletal survey.  No suspcious lytic or blastic lesions visualized.  Severe multilevel degenerative disc disease in the cervical spine.    04/29/2013 Imaging PeT. No discrete suspicious focus of FDG uptake identified.    05/20/2013 - 06/09/2013 Chemotherapy Started carfilxomib 39 mg on 08/25, 08/26, day 8 (06/02/13), day 9 on 06/03/2013 and on 06/09/2013.     12/03/2013 Treatment Plan Change Last office visit with Dr. Glenna Durand.  She decline further chemotherapy.   Her son passed away and she relocated to [place] to stay with her daughter [name].    03/11/2014 - 03/17/2014 Hospital Admission Admitted for low hemoglobin. Dr. Burney Gauze consulted.  SPEP,  UPEP and Bone marrow biopsy obtained.    IgG 11,300. M-spike 7.52, kappa lamda ratio 0 with lambda free light chains totaling 1260.    03/12/2014 Imaging Skeletal survey. 1. Small lytic lesions within the skull and right scapula. 2. Degenerative changes in the spine and knees, right greater than left. 3. Cardiomegaly without pulmonary edema.   03/17/2014 Bone  Marrow Biopsy Hypercellular bone marrow tih extensive involvment by plasma cell neoplasm (Plasma cells 85%). FISH: Presence of 2 copies of the IGH/FGFR3, ATM, CCND1/IGH, and p53 probes.  Loss of 13q34 probes in 40% of cells. gain of chromose 12 and either loss of chorm   03/25/2014 -  Chemotherapy Planning Kyprolis 15 mg/m2 plus dexamethasone weekly plus revlimid (32m) daily on 06/29.   06/14/2014 -  Chemotherapy Patient's last dose of Kyprolis was on 05/26/14. Since then chemo held due to neutropenia  despite GCSF support. I will hold Revlimid also as of 06/14/14   06/28/2014 -  Chemotherapy New cycle of Kyprolis starts today.   07/07/2014 Tumor Marker IgG 3960      INTERVAL HISTORY:  Carrie Boran73y.o. female with a history of IgG Lambda Multiple myeloma complicated by non-compliance, ESRDz on H/D(Tues, Thursday and Sat) is here for follow up. She was initially consulted by [place] 03/17/14. She was last seen by Dr. SLona Kettleon 07/12/14.  Today,she reports feeling much better with improved energy.  She requests a flu vaccine. She undergoes dialysis every Tuesday, Thursday and Sat. Her revlimid dose was reduced 50%.  No new problems. She has been taking dexamethasone weekly and revlimid at 5 mg daily dose. Patient is otherwise doing well. K+ is 5.2 but she gets HD tomorrow.   MEDICAL HISTORY: Past Medical History  Diagnosis Date  . Renal disorder   . ESRD (end stage renal disease)   . Hypertension   . Multiple myeloma 2009  . Thyroid disease   . Hyperparathyroidism   . Thrombocytopenia   . Anemia   . Shortness of breath   . Pneumonia   . GERD (gastroesophageal reflux disease)   . 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    ALLERGIES:  has No Known Allergies.  MEDICATIONS: has a current medication list which includes the following prescription(s): acetaminophen-codeine, acyclovir, amlodipine, aspirin ec, cyproheptadine, dexamethasone, furosemide, hydralazine, isosorbide mononitrate, lenalidomide, lorazepam, metolazone, multiple vitamins-iron, prochlorperazine, sevelamer carbonate, hydrocodone-homatropine, and ondansetron.  SURGICAL HISTORY:  Past Surgical History  Procedure Laterality Date  . Abdominal hysterectomy      Pt. denies    REVIEW OF SYSTEMS:    Constitutional: Denies fevers, chills or abnormal weight loss. States that they pulled too much fluid off her during dialysis. Eyes: Denies blurriness of vision Ears, nose, mouth, throat, and face: Denies mucositis or sore throat Respiratory: Denies cough, dyspnea or wheezes Cardiovascular: Denies palpitation, chest discomfort or lower extremity swelling Gastrointestinal:  Denies nausea, heartburn or change in bowel habits Skin: Denies abnormal skin rashes Lymphatics: Denies new lymphadenopathy or easy bruising Neurological:Denies numbness, tingling or new weaknesses Behavioral/Psych: Mood is stable, no new changes  All other systems were reviewed with the patient and are negative.  PHYSICAL EXAMINATION: BP 108/63  Pulse 54  Temp(Src) 98.4 F (36.9 C) (Oral)  Resp 18  Ht '5\' 7"'  (1.702 m)  Wt 137 lb 6.4 oz (62.324 kg)  BMI 21.51 kg/m2   ECOG PEFORMANCE STATUS: 1  GENERAL:alert, no distress and comfortable; well developed and well nourished. Anxious SKIN: skin color, texture, turgor are normal, no rashes or significant lesions EYES: normal, Conjunctiva are pink and non-injected, sclera clear OROPHARYNX:no exudate, no erythema and lips, buccal mucosa, and tongue normal  NECK: supple, thyroid  normal size, non-tender, without nodularity LYMPH:  no palpable lymphadenopathy in the cervical, axillary or supraclavicular LUNGS: clear to auscultation with normal breathing effort, no wheezes or rhonchi HEART: regular rate & rhythm and no murmurs and 1+ edema in ankles bilaterally; L AVF ABDOMEN:abdomen soft, non-tender and normal bowel sounds Musculoskeletal:no cyanosis of digits and no clubbing  NEURO: alert & oriented x 3 with fluent speech, no focal motor/sensory deficits   RADIOGRAPHIC STUDIES:    Dg Bone Survey Met  03/12/2014   CLINICAL DATA:  Myeloma.  Right knee pain.  EXAM: METASTATIC BONE SURVEY  COMPARISON:  None.  FINDINGS: Multiple images are performed of the axial and  appendicular skeleton.  Multiple small lytic lesions are identified within the skull consistent with myeloma. A small lytic lesion is identified within the right scapula.  Degenerative changes are identified within the mid cervical spine. No suspicious lytic or blastic lesions are identified within the spine, or long bones. Surgical clips are identified in the left upper arm. Degenerative changes are seen in the knees bilaterally, right greater than left.  The heart is mildly enlarged. There are areas of atelectasis within the right lung. No focal consolidations or pulmonary edema.  IMPRESSION: 1. Small lytic lesions within the skull and right scapula. 2. Degenerative changes in the spine and knees, right greater than left. 3. Cardiomegaly without pulmonary edema.   Electronically Signed   By: Shon Hale M.D.   On: 03/12/2014 10:13     ASSESSMENT: [name] 73 y.o. female with a history of Multiple Myeloma and ESRD on HD currently on a modified regimen of Dexamethasone, Revlimid and Carfilzomib. Chemo held last few weeks due to neutropenia from chemo and Revlimid. She has transport issues and on T/T/S she goes for hemodialysis. She will start cycle 5 today on 07/26/14 as counts have recovered. She will continue dexamethasone which has anti-myeloma properties as well.She will continue low dose Revlimid today.  PLAN:   1. IgG Lambda MM, rapidly progressive.  --Her Bone marrow is packed with 85% plasma cells likely explaining her refractory anemia and pancytopenia. Her protein was 14.7 down to 12.5 to 10.3 to 3.9 grams  since starting chemotherapy. Her case has been difficult due to a history of non-compliance. She stated that she stopped her recent treatment in [place] due to being hospitalized. Per records from her primary oncologist's office, it was likely due to her kidney failure due to progression of her multiple myeloma. Her last treatment is as noted above. Considering she received only one cycle of  carfilzomib plus dexamethasone, we started her back on this regiment based on Nicole Kindred et. Al, NEJM, 2015).   *Carfilzomib (15 mg/m2) on days 1, 3, 8, 10, 15, 17  *Lenalidomide 20m daily (please take follow H/D on Tue/th/Sa). --dose reduced by 50% due to delayed count recovery. Revlimid is now being held as of today 06/14/14 *Dexamethasone 12 mg weekly  We presented the treatment indications, benefits and side-effects including but not limited to myelosuprresion which can result in life-threatening infections and anemia and low plts, and nausea or vomiting or rashes. Continue acyclovir for antiviral prophylaxis.  She understood the indications, risks and benefits and chose to proceed. She started therapy on 03/29/2014. She received 4 total doses of carfilzomib for cycle #1 and a total of 5 doses for cycle #2.  We will obtain Multiple myeloma markers monthly starting on 08/24.  Markers from last month revealed a decrease in her M-spike. Her IgG level on 05/24/2014 was  down to 4.8 grams (4820 mg) from 11.3 grams (11300 mg) in June 2015.  Her IgG was 3920 mg on 06/21/2014.  She's had some transportation issues and has missed some doses of cycle #4. She is due to start cycle #5 on 07/26/2014. She is been receiving a dose of Neupogen on the Friday she receives treatment.    2. Pancytopenia with anemia likely multifactorial.  --Secondary to #1. We will start on aranesp biweekly if not provided with her H/D.  She cannot have transfusions on the days of her dialysis (Tues, Thurs and Sat). Her counts have recovered with a total white count of 4.6, ANC of 2.5, hemoglobin 10.6 and platelets of 145.  3. ESRDz.  --Likely secondary to #1. Continue hemodialysis on Tues, Thursday and Sat.   4. Psuedohyponatremia.  --Likely due to #1. Treat underlying disorder. Her sodium is stable at 133.   5. History of Noncompliance.  -- Ongoing problem but we are trying to work with her.  6. Lower extremity swelling. --.  May  require additional fluid pulled off with h/d. Lasix prn.  7. Poor nutrition. --She has had dental pain with resolution which may in part explain some weight lost.  We will refer to nutrition with consideration for nutritional supplementation with boost and/or ensure. She will be referred to oral surgeon. She is not on bisphosphonate currently.  8. Hyperkalemia. --HD Tomorrow k+ 5.2.  9. Follow up.  --Patient will follow up in our office in one to 2 weeks pending availability of Dr. Renella Cunas scheduled so we can check her counts and reevaluate her treatment  plan.   All questions were answered. The patient knows to call the clinic with any problems, questions or concerns. We can certainly see the patient much sooner if necessary. Lasix prescription was e-scripted today.  I spent 20 minutes counseling the patient face to face. The total time spent in the appointment was 25 minutes.  Wynetta Emery, Markitta Ausburn E, PA-C

## 2014-07-26 ENCOUNTER — Other Ambulatory Visit: Payer: Self-pay | Admitting: Hematology

## 2014-07-26 ENCOUNTER — Ambulatory Visit (HOSPITAL_BASED_OUTPATIENT_CLINIC_OR_DEPARTMENT_OTHER): Payer: Medicare Other

## 2014-07-26 ENCOUNTER — Other Ambulatory Visit: Payer: Self-pay

## 2014-07-26 ENCOUNTER — Telehealth: Payer: Self-pay | Admitting: Hematology

## 2014-07-26 VITALS — BP 120/60 | HR 57 | Temp 98.2°F | Resp 20

## 2014-07-26 DIAGNOSIS — C9000 Multiple myeloma not having achieved remission: Secondary | ICD-10-CM

## 2014-07-26 DIAGNOSIS — Z5112 Encounter for antineoplastic immunotherapy: Secondary | ICD-10-CM

## 2014-07-26 MED ORDER — DEXTROSE 5 % IV SOLN
15.0000 mg/m2 | Freq: Once | INTRAVENOUS | Status: AC
  Administered 2014-07-26: 26 mg via INTRAVENOUS
  Filled 2014-07-26: qty 13

## 2014-07-26 MED ORDER — DEXAMETHASONE SODIUM PHOSPHATE 10 MG/ML IJ SOLN
10.0000 mg | Freq: Once | INTRAMUSCULAR | Status: AC
  Administered 2014-07-26: 10 mg via INTRAVENOUS

## 2014-07-26 MED ORDER — DEXAMETHASONE SODIUM PHOSPHATE 10 MG/ML IJ SOLN
INTRAMUSCULAR | Status: AC
  Filled 2014-07-26: qty 1

## 2014-07-26 MED ORDER — ONDANSETRON 8 MG/NS 50 ML IVPB
INTRAVENOUS | Status: AC
  Filled 2014-07-26: qty 8

## 2014-07-26 MED ORDER — ONDANSETRON 8 MG/50ML IVPB (CHCC)
8.0000 mg | Freq: Once | INTRAVENOUS | Status: AC
Start: 2014-07-26 — End: 2014-07-26
  Administered 2014-07-26: 8 mg via INTRAVENOUS

## 2014-07-26 MED ORDER — SODIUM CHLORIDE 0.9 % IV SOLN
Freq: Once | INTRAVENOUS | Status: AC
  Administered 2014-07-26: 11:00:00 via INTRAVENOUS

## 2014-07-26 NOTE — Patient Instructions (Signed)
[facility] Discharge Instructions for Patients Receiving Chemotherapy  Today you received the following chemotherapy agents:  kyprolis  To help prevent nausea and vomiting after your treatment, we encourage you to take your nausea medication   If you develop nausea and vomiting that is not controlled by your nausea medication, call the clinic.   BELOW ARE SYMPTOMS THAT SHOULD BE REPORTED IMMEDIATELY:  *FEVER GREATER THAN 100.5 F  *CHILLS WITH OR WITHOUT FEVER  NAUSEA AND VOMITING THAT IS NOT CONTROLLED WITH YOUR NAUSEA MEDICATION  *UNUSUAL SHORTNESS OF BREATH  *UNUSUAL BRUISING OR BLEEDING  TENDERNESS IN MOUTH AND THROAT WITH OR WITHOUT PRESENCE OF ULCERS  *URINARY PROBLEMS  *BOWEL PROBLEMS  UNUSUAL RASH Items with * indicate a potential emergency and should be followed up as soon as possible.  Feel free to call the clinic you have any questions or concerns. The clinic phone number is [phone].

## 2014-07-27 LAB — SPEP & IFE WITH QIG
Albumin ELP: 44.2 % — ABNORMAL LOW (ref 55.8–66.1)
Alpha-1-Globulin: 3.1 % (ref 2.9–4.9)
Alpha-2-Globulin: 5.6 % — ABNORMAL LOW (ref 7.1–11.8)
Beta 2: 2.3 % — ABNORMAL LOW (ref 3.2–6.5)
Beta Globulin: 3.2 % — ABNORMAL LOW (ref 4.7–7.2)
GAMMA GLOBULIN: 41.6 % — AB (ref 11.1–18.8)
IgA: <6 mg/dL — ABNORMAL LOW (ref 69–380)
IgG (Immunoglobin G), Serum: 3620 mg/dL — ABNORMAL HIGH (ref 690–1700)
IgM, Serum: <5 mg/dL — ABNORMAL LOW (ref 52–322)
M-Spike, %: 3.38 g/dL
TOTAL PROTEIN, SERUM ELECTROPHOR: 9 g/dL — AB (ref 6.0–8.3)

## 2014-07-27 LAB — KAPPA/LAMBDA LIGHT CHAINS
KAPPA FREE LGHT CHN: 1.24 mg/dL (ref 0.33–1.94)
Kappa:Lambda Ratio: 0 — ABNORMAL LOW (ref 0.26–1.65)
LAMBDA FREE LGHT CHN: 623 mg/dL — AB (ref 0.57–2.63)

## 2014-07-28 ENCOUNTER — Ambulatory Visit (HOSPITAL_BASED_OUTPATIENT_CLINIC_OR_DEPARTMENT_OTHER): Payer: Medicare Other

## 2014-07-28 VITALS — BP 111/63 | HR 57 | Temp 98.7°F | Resp 20

## 2014-07-28 DIAGNOSIS — Z5112 Encounter for antineoplastic immunotherapy: Secondary | ICD-10-CM

## 2014-07-28 DIAGNOSIS — C9000 Multiple myeloma not having achieved remission: Secondary | ICD-10-CM

## 2014-07-28 MED ORDER — SODIUM CHLORIDE 0.9 % IV SOLN
Freq: Once | INTRAVENOUS | Status: AC
  Administered 2014-07-28: 11:00:00 via INTRAVENOUS

## 2014-07-28 MED ORDER — DEXAMETHASONE SODIUM PHOSPHATE 10 MG/ML IJ SOLN
10.0000 mg | Freq: Once | INTRAMUSCULAR | Status: AC
  Administered 2014-07-28: 10 mg via INTRAVENOUS

## 2014-07-28 MED ORDER — ONDANSETRON 8 MG/NS 50 ML IVPB
INTRAVENOUS | Status: AC
Start: 2014-07-28 — End: 2014-07-28
  Filled 2014-07-28: qty 8

## 2014-07-28 MED ORDER — ONDANSETRON 8 MG/50ML IVPB (CHCC)
8.0000 mg | Freq: Once | INTRAVENOUS | Status: AC
  Administered 2014-07-28: 8 mg via INTRAVENOUS

## 2014-07-28 MED ORDER — DEXAMETHASONE SODIUM PHOSPHATE 10 MG/ML IJ SOLN
INTRAMUSCULAR | Status: AC
  Filled 2014-07-28: qty 1

## 2014-07-28 MED ORDER — DEXTROSE 5 % IV SOLN
15.0000 mg/m2 | Freq: Once | INTRAVENOUS | Status: AC
  Administered 2014-07-28: 26 mg via INTRAVENOUS
  Filled 2014-07-28: qty 13

## 2014-07-28 NOTE — Patient Instructions (Signed)
[facility] Discharge Instructions for Patients Receiving Chemotherapy  Today you received the following chemotherapy agents: Kyprolis  To help prevent nausea and vomiting after your treatment, we encourage you to take your nausea medication as prescribed by your physician.   If you develop nausea and vomiting that is not controlled by your nausea medication, call the clinic.   BELOW ARE SYMPTOMS THAT SHOULD BE REPORTED IMMEDIATELY:  *FEVER GREATER THAN 100.5 F  *CHILLS WITH OR WITHOUT FEVER  NAUSEA AND VOMITING THAT IS NOT CONTROLLED WITH YOUR NAUSEA MEDICATION  *UNUSUAL SHORTNESS OF BREATH  *UNUSUAL BRUISING OR BLEEDING  TENDERNESS IN MOUTH AND THROAT WITH OR WITHOUT PRESENCE OF ULCERS  *URINARY PROBLEMS  *BOWEL PROBLEMS  UNUSUAL RASH Items with * indicate a potential emergency and should be followed up as soon as possible.  Feel free to call the clinic you have any questions or concerns. The clinic phone number is [phone].    

## 2014-07-29 ENCOUNTER — Other Ambulatory Visit: Payer: Self-pay | Admitting: *Deleted

## 2014-07-29 ENCOUNTER — Telehealth: Payer: Self-pay | Admitting: *Deleted

## 2014-07-29 DIAGNOSIS — C9000 Multiple myeloma not having achieved remission: Secondary | ICD-10-CM

## 2014-07-29 MED ORDER — LENALIDOMIDE 2.5 MG PO CAPS: 2.5000 mg | ORAL_CAPSULE | Freq: Every day | ORAL | Status: DC

## 2014-07-29 NOTE — Telephone Encounter (Signed)
Received fax from [employer]- will be in touch after verifying insurance and making delivery arrangements

## 2014-07-29 NOTE — Addendum Note (Signed)
Addended by: Wyonia Hough on: 07/29/2014 03:17 PM   Modules accepted: Orders

## 2014-07-29 NOTE — Telephone Encounter (Signed)
THIS REFILL REQUEST FOR REVLIMID WAS GIVEN TO DR.SEHBAI'S NURSE, KATHI BUYCK,RN.

## 2014-07-30 ENCOUNTER — Other Ambulatory Visit: Payer: Self-pay | Admitting: *Deleted

## 2014-07-30 ENCOUNTER — Ambulatory Visit (HOSPITAL_BASED_OUTPATIENT_CLINIC_OR_DEPARTMENT_OTHER): Payer: Medicare Other

## 2014-07-30 ENCOUNTER — Telehealth: Payer: Self-pay | Admitting: Hematology

## 2014-07-30 VITALS — BP 102/51 | HR 53 | Temp 98.2°F | Resp 18

## 2014-07-30 DIAGNOSIS — C9000 Multiple myeloma not having achieved remission: Secondary | ICD-10-CM

## 2014-07-30 DIAGNOSIS — Z5189 Encounter for other specified aftercare: Secondary | ICD-10-CM

## 2014-07-30 DIAGNOSIS — D61818 Other pancytopenia: Secondary | ICD-10-CM

## 2014-07-30 MED ORDER — TBO-FILGRASTIM 480 MCG/0.8ML ~~LOC~~ SOSY
480.0000 ug | PREFILLED_SYRINGE | Freq: Once | SUBCUTANEOUS | Status: AC
  Administered 2014-07-30: 480 ug via SUBCUTANEOUS
  Filled 2014-07-30: qty 0.8

## 2014-07-30 MED ORDER — FILGRASTIM 480 MCG/0.8ML IJ SOLN
480.0000 ug | Freq: Once | INTRAMUSCULAR | Status: DC
Start: 2014-07-30 — End: 2014-07-30

## 2014-07-30 NOTE — Telephone Encounter (Signed)
gv adn printed appt sched and avs fo rpt for NOV.....sed added tx. °

## 2014-08-02 ENCOUNTER — Ambulatory Visit: Payer: Self-pay

## 2014-08-02 ENCOUNTER — Encounter: Payer: Self-pay | Admitting: *Deleted

## 2014-08-02 ENCOUNTER — Encounter: Payer: Self-pay | Admitting: Hematology

## 2014-08-02 ENCOUNTER — Ambulatory Visit (HOSPITAL_BASED_OUTPATIENT_CLINIC_OR_DEPARTMENT_OTHER): Payer: Medicare Other | Admitting: Hematology

## 2014-08-02 ENCOUNTER — Telehealth: Payer: Self-pay | Admitting: Hematology

## 2014-08-02 ENCOUNTER — Ambulatory Visit (HOSPITAL_BASED_OUTPATIENT_CLINIC_OR_DEPARTMENT_OTHER): Payer: Medicare Other

## 2014-08-02 ENCOUNTER — Other Ambulatory Visit: Payer: Self-pay

## 2014-08-02 ENCOUNTER — Other Ambulatory Visit (HOSPITAL_BASED_OUTPATIENT_CLINIC_OR_DEPARTMENT_OTHER): Payer: Medicare Other

## 2014-08-02 VITALS — BP 121/70 | HR 51 | Temp 99.1°F | Resp 18 | Ht 67.0 in | Wt 140.5 lb

## 2014-08-02 DIAGNOSIS — E875 Hyperkalemia: Secondary | ICD-10-CM

## 2014-08-02 DIAGNOSIS — C9000 Multiple myeloma not having achieved remission: Secondary | ICD-10-CM

## 2014-08-02 DIAGNOSIS — Z5112 Encounter for antineoplastic immunotherapy: Secondary | ICD-10-CM

## 2014-08-02 DIAGNOSIS — N186 End stage renal disease: Secondary | ICD-10-CM

## 2014-08-02 DIAGNOSIS — D61818 Other pancytopenia: Secondary | ICD-10-CM

## 2014-08-02 DIAGNOSIS — Z992 Dependence on renal dialysis: Secondary | ICD-10-CM

## 2014-08-02 LAB — CBC WITH DIFFERENTIAL/PLATELET
BASO%: 0 % (ref 0.0–2.0)
Basophils Absolute: 0 10*3/uL (ref 0.0–0.1)
EOS ABS: 0.1 10*3/uL (ref 0.0–0.5)
EOS%: 1.6 % (ref 0.0–7.0)
HCT: 32.9 % — ABNORMAL LOW (ref 34.8–46.6)
HGB: 10.6 g/dL — ABNORMAL LOW (ref 11.6–15.9)
LYMPH#: 1.5 10*3/uL (ref 0.9–3.3)
LYMPH%: 29.9 % (ref 14.0–49.7)
MCH: 33.5 pg (ref 25.1–34.0)
MCHC: 32.2 g/dL (ref 31.5–36.0)
MCV: 104.1 fL — ABNORMAL HIGH (ref 79.5–101.0)
MONO#: 0.7 10*3/uL (ref 0.1–0.9)
MONO%: 13.4 % (ref 0.0–14.0)
NEUT%: 55.1 % (ref 38.4–76.8)
NEUTROS ABS: 2.8 10*3/uL (ref 1.5–6.5)
Platelets: 130 10*3/uL — ABNORMAL LOW (ref 145–400)
RBC: 3.16 10*6/uL — AB (ref 3.70–5.45)
RDW: 16.2 % — AB (ref 11.2–14.5)
WBC: 5.2 10*3/uL (ref 3.9–10.3)

## 2014-08-02 LAB — COMPREHENSIVE METABOLIC PANEL (CC13)
ALBUMIN: 2.9 g/dL — AB (ref 3.5–5.0)
ALT: 14 U/L (ref 0–55)
ANION GAP: 10 meq/L (ref 3–11)
AST: 13 U/L (ref 5–34)
Alkaline Phosphatase: 39 U/L — ABNORMAL LOW (ref 40–150)
BUN: 47.8 mg/dL — AB (ref 7.0–26.0)
CALCIUM: 9.2 mg/dL (ref 8.4–10.4)
CHLORIDE: 95 meq/L — AB (ref 98–109)
CO2: 27 meq/L (ref 22–29)
Creatinine: 9.2 mg/dL (ref 0.6–1.1)
GLUCOSE: 82 mg/dL (ref 70–140)
POTASSIUM: 4.8 meq/L (ref 3.5–5.1)
SODIUM: 131 meq/L — AB (ref 136–145)
TOTAL PROTEIN: 8.1 g/dL (ref 6.4–8.3)
Total Bilirubin: 0.46 mg/dL (ref 0.20–1.20)

## 2014-08-02 MED ORDER — SODIUM CHLORIDE 0.9 % IV SOLN: Freq: Once | INTRAVENOUS | Status: DC

## 2014-08-02 MED ORDER — DEXTROSE 5 % IV SOLN
15.0000 mg/m2 | Freq: Once | INTRAVENOUS | Status: AC
  Administered 2014-08-02: 26 mg via INTRAVENOUS
  Filled 2014-08-02: qty 13

## 2014-08-02 MED ORDER — DEXAMETHASONE SODIUM PHOSPHATE 10 MG/ML IJ SOLN
10.0000 mg | Freq: Once | INTRAMUSCULAR | Status: AC
  Administered 2014-08-02: 10 mg via INTRAVENOUS

## 2014-08-02 MED ORDER — DEXAMETHASONE SODIUM PHOSPHATE 10 MG/ML IJ SOLN
INTRAMUSCULAR | Status: AC
  Filled 2014-08-02: qty 1

## 2014-08-02 MED ORDER — SODIUM CHLORIDE 0.9 % IV SOLN
Freq: Once | INTRAVENOUS | Status: AC
  Administered 2014-08-02: 14:00:00 via INTRAVENOUS

## 2014-08-02 MED ORDER — ONDANSETRON 8 MG/50ML IVPB (CHCC)
8.0000 mg | Freq: Once | INTRAVENOUS | Status: AC
  Administered 2014-08-02: 8 mg via INTRAVENOUS

## 2014-08-02 MED ORDER — ONDANSETRON 8 MG/NS 50 ML IVPB
INTRAVENOUS | Status: AC
  Filled 2014-08-02: qty 8

## 2014-08-02 NOTE — Telephone Encounter (Signed)
added appts thru Dec..Marland KitchenMarland KitchenLaurn S.W. helping pt with transportation

## 2014-08-02 NOTE — Progress Notes (Signed)
[facility] OFFICE PROGRESS NOTE Date of Visit: 08/02/2014   DIAGNOSIS: Multiple Myeloma for follow up, ESRD on HD T/T/S schedule.  CURRENT TREATMENT:  Carfilzomib 15 mg/m2 on days 1,3, 8, 10,15, 17 (her dialysis is on day 2); Dexamethasone 12 mg weekly on days 1, 8, 15, 22; revlimid 5 mg daily for 28 days.  She was last seen by [name] on 07/23/2014.  Carfilzomib started on 03/29/2014.  She received it on 6/29 and 03/31/2014; Doses on 7/6 and 7/8 were held due to severe anemia. 7/13 and 7/15 she got it so 4 doses for 1st cycle.  Cycle 2 started 7/27 and 7/29, 8/5, 8/10 and 8/12 so she got 5 doses. With cycle 3 she got doses on 8/24 and 8/26. 8/31 Chemo held low ANC 500, on 06/09/14 we did not do chemo because ANC 700. She got 1 dose of neupogen 480 mcg sq. She continued the weekly Dexamethasone and Revlimid at 5 mg daily. Labs last week show persistent neutropenia so I am going held Revlimid also as of 06/14/14 and delayed chemo for another week.   She started Cycle 4 on 9/28 and 9/30. On 10/5 she did not come as she did not have transport and compliance has been an issue in past as well. I have added Neupogen every Friday on the weeks of her chemotherapy. She will get chemo today and Neupogen on Friday.  She will get Carfilzomib today and on 10/14 and neupogen shot on 10/16. Next week she will be off therapy and we will check her myeloma markers again on 07/23/14.  Her Cycle 5 started on 07/26/2014. She gets her chemotherapy on Monday and Wednesday and Neupogen shot on Friday. This happens for 3 weeks and then she had 1 week off. Her myeloma markers are showing improvement with IgG and M protein trending down suggestive of partial response. Today is her cycle 5 day 8 treatment.  LABS REVIEWED TODAY prior to chemotherapy:                Multiple myeloma   03/30/2008 Initial Diagnosis Multiple myeloma   03/30/2008 - 05/01/2012 Chemotherapy Revlimid/Dex then  Velcade (started on 07/13/2011 ending on 09/14/2011) without response.  Managed by Dr. Chauncey Fischer of [place], [place] [address]   07/24/2010 -  Chemotherapy Received zometa  intermittently over one year.  Creatinine was 1.0 on 07/24/2010. 1.7 on 08/10/2011.    05/01/2012 - 09/01/2012 Chemotherapy Starte Pomalidomide s/p 4 cycles. Referred to [facility] for consideration of ASCT.     10/08/2012 Bone Marrow Biopsy Normocellular marrow with residual/recurrently plasma cell myeloma, 60-70 % of overall marrow celluarity.  Flow: c/w plasma cell neoplasm, plasma cell infiltrate comprises 60-70 % of overall marrow cellularity; FISH: + for 1 q21/CKS1B gain; RB1 (13q14)   10/08/2012 Bone Marrow Biopsy Continued... Cytogenetics showed normal female karyotype.    10/29/2012 Tumor Marker High IgG (6019), high lamda free light chain (409.32) with low IgA, IgM and kappa free light chain.    04/21/2013 Imaging Skeletal survey.  No suspcious lytic or blastic lesions visualized.  Severe multilevel degenerative disc disease in the cervical spine.    04/29/2013 Imaging PeT. No discrete suspicious focus of FDG uptake identified.    05/20/2013 - 06/09/2013 Chemotherapy Started carfilxomib 39 mg on 08/25, 08/26, day 8 (06/02/13), day 9 on 06/03/2013 and on 06/09/2013.     12/03/2013 Treatment Plan Change Last office visit with Dr. Glenna Durand.  She decline further chemotherapy.   Her son  passed away and she relocated to [place] to stay with her daughter [name].    03/11/2014 - 03/17/2014 Hospital Admission Admitted for low hemoglobin. Dr. Burney Gauze consulted.  SPEP,  UPEP and Bone marrow biopsy obtained.    IgG 11,300. M-spike 7.52, kappa lamda ratio 0 with lambda free light chains totaling 1260.    03/12/2014 Imaging Skeletal survey. 1. Small lytic lesions within the skull and right scapula. 2. Degenerative changes in the spine and knees, right greater than left. 3. Cardiomegaly without pulmonary edema.   03/17/2014 Bone Marrow  Biopsy Hypercellular bone marrow tih extensive involvment by plasma cell neoplasm (Plasma cells 85%). FISH: Presence of 2 copies of the IGH/FGFR3, ATM, CCND1/IGH, and p53 probes.  Loss of 13q34 probes in 40% of cells. gain of chromose 12 and either loss of chorm   03/25/2014 -  Chemotherapy Planning Kyprolis 15 mg/m2 plus dexamethasone weekly plus revlimid (21m) daily on 06/29.   06/14/2014 -  Chemotherapy Patient's last dose of Kyprolis was on 05/26/14. Since then chemo held due to neutropenia despite GCSF support. I will hold Revlimid also as of 06/14/14   06/28/2014 -  Chemotherapy New cycle of Kyprolis starts today.   07/07/2014 Tumor Marker IgG 3960    07/23/2014 Tumor Marker IgG 3620     INTERVAL HISTORY:  Carrie Kerekes89y.o. female with a history of IgG Lambda Multiple myeloma complicated by non-compliance, ESRDz on H/D(Tues, Thursday and Sat) is here for follow up. She was initially consulted by [place] 03/17/14. She was last seen here on 07/23/2014.  Today,she reports feeling much better with improved energy.  She reports seeing dentistry and completing a course of antibiotics with resolution of her dental pain.  She denies fevers or chills. She had a recent hospitalization (6/11 - 03/17/2014) due to profound anemia. She was transfused packed RBCs doing this admission.   She complains of swelling in her ankles. She undergoes dialysis every Tuesday, Thursday and Sat. Her revlimid dose was reduced 50%.  No new problems. She has been taking dexamethasone weekly and revlimid at 5 mg daily dose. Patient is otherwise doing well. K+ is 4.8 she gets HD tomorrow.   MEDICAL HISTORY: Past Medical History  Diagnosis Date  . Renal disorder   . ESRD (end stage renal disease)   . Hypertension   . Multiple myeloma 2009  . Thyroid disease   . Hyperparathyroidism   . Thrombocytopenia   . Anemia   . Shortness of breath   . Pneumonia   . GERD (gastroesophageal reflux disease)   . Arthritis     rt knee is  stiff    INTERIM HISTORY: has BRBPR (bright red blood per rectum); Acute blood loss anemia; ESRD (end stage renal disease); Multiple myeloma; Pancytopenia; HTN (hypertension); and Drug induced neutropenia(288.03) on her problem list.    ALLERGIES:  has No Known Allergies.  MEDICATIONS: has a current medication list which includes the following prescription(s): acetaminophen-codeine, acyclovir, amlodipine, aspirin ec, cyproheptadine, dexamethasone, furosemide, hydralazine, hydrocodone-homatropine, isosorbide mononitrate, lenalidomide, lorazepam, metolazone, multiple vitamins-iron, ondansetron, prochlorperazine, and sevelamer carbonate.  SURGICAL HISTORY:  Past Surgical History  Procedure Laterality Date  . Abdominal hysterectomy      Pt. denies    REVIEW OF SYSTEMS:   Constitutional: Denies fevers, chills or abnormal weight loss. States that they pulled too much fluid off her during dialysis. Eyes: Denies blurriness of vision Ears, nose, mouth, throat, and face: Denies mucositis or sore throat Respiratory: Denies cough, dyspnea or wheezes Cardiovascular:  Denies palpitation, chest discomfort or lower extremity swelling Gastrointestinal:  Denies nausea, heartburn or change in bowel habits Skin: Denies abnormal skin rashes Lymphatics: Denies new lymphadenopathy or easy bruising Neurological:Denies numbness, tingling or new weaknesses Behavioral/Psych: Mood is stable, no new changes  All other systems were reviewed with the patient and are negative.  PHYSICAL EXAMINATION:   ECOG PEFORMANCE STATUS: 1  GENERAL:alert, no distress and comfortable; well developed and well nourished. Anxious SKIN: skin color, texture, turgor are normal, no rashes or significant lesions EYES: normal, Conjunctiva are pink and non-injected, sclera clear OROPHARYNX:no exudate, no erythema and lips, buccal mucosa, and tongue normal  NECK: supple, thyroid normal size, non-tender, without nodularity LYMPH:  no  palpable lymphadenopathy in the cervical, axillary or supraclavicular LUNGS: clear to auscultation with normal breathing effort, no wheezes or rhonchi HEART: regular rate & rhythm and no murmurs and 1+ edema in ankles bilaterally; L AVF ABDOMEN:abdomen soft, non-tender and normal bowel sounds Musculoskeletal:no cyanosis of digits and no clubbing  NEURO: alert & oriented x 3 with fluent speech, no focal motor/sensory deficits   RADIOGRAPHIC STUDIES:    Dg Bone Survey Met  03/12/2014   CLINICAL DATA:  Myeloma.  Right knee pain.  EXAM: METASTATIC BONE SURVEY  COMPARISON:  None.  FINDINGS: Multiple images are performed of the axial and appendicular skeleton.  Multiple small lytic lesions are identified within the skull consistent with myeloma. A small lytic lesion is identified within the right scapula.  Degenerative changes are identified within the mid cervical spine. No suspicious lytic or blastic lesions are identified within the spine, or long bones. Surgical clips are identified in the left upper arm. Degenerative changes are seen in the knees bilaterally, right greater than left.  The heart is mildly enlarged. There are areas of atelectasis within the right lung. No focal consolidations or pulmonary edema.  IMPRESSION: 1. Small lytic lesions within the skull and right scapula. 2. Degenerative changes in the spine and knees, right greater than left. 3. Cardiomegaly without pulmonary edema.   Electronically Signed   By: Shon Hale M.D.   On: 03/12/2014 10:13     ASSESSMENT: [name] 73 y.o. female with a history of Multiple Myeloma and ESRD on HD currently on a modified regimen of Dexamethasone, Revlimid and Carfilzomib. Chemo held last few weeks due to neutropenia from chemo and Revlimid. She has transport issues and on T/T/S she goes for hemodialysis. She will start cycle 4 today on 06/28/14 as counts have recovered. The platelets have already recovered and the white count is normal. She will  continue dexamethasone which has anti-myeloma properties as well.She will resume low dose Revlimid today.  PLAN:   1. IgG Lambda MM, rapidly progressive.  --Her Bone marrow is packed with 85% plasma cells likely explaining her refractory anemia and pancytopenia. Her protein was 14.7 down to 12.5 to 10.3 to 3.9 grams  since starting chemotherapy. Her case has been difficult due to a history of non-compliance. She stated that she stopped her recent treatment in [place] due to being hospitalized. Per records from her primary oncologist's office, it was likely due to her kidney failure due to progression of her multiple myeloma. Her last treatment is as noted above. Considering she received only one cycle of carfilzomib plus dexamethasone, we started her back on this regiment based on Nicole Kindred et. Al, NEJM, 2015).   *Carfilzomib (15 mg/m2) on days 1, 3, 8, 10, 15, 17  *Lenalidomide 91m daily (please take follow H/D on Tue/th/Sa). --  dose reduced by 50% due to delayed count recovery. Revlimid is now being held as of today 06/14/14 *Dexamethasone 12 mg weekly  We presented the treatment indications, benefits and side-effects including but not limited to myelosuprresion which can result in life-threatening infections and anemia and low plts, and nausea or vomiting or rashes. Continue acyclovir for antiviral prophylaxis.  She understood the indications, risks and benefits and chose to proceed. She started therapy on 03/29/2014. She received 4 total doses of carfilzomib for cycle #1 and a total of 5 doses for cycle #2.  We will obtain Multiple myeloma markers monthly starting on 08/24.  Markers from last month revealed a decrease in her M-spike. Her IgG level on 05/24/2014 was down to 4.8 grams (4820 mg) from 11.3 grams (11300 mg) in June 2015.  IgG on 07/23/2014 was 3620 trending down.  She is on cycle 4 now week 2 missed dose on Monday did not show up but will get carfilzomib today and neupogen on Friday. She will get  two dose of Carfilzomib this week and neupogen on Friday to complete this cycle and we will see her again next week on 07/23/14.  2. Pancytopenia with anemia likely multifactorial.  --Secondary to #1. We will start on aranesp biweekly if not provided with her H/D.  She cannot have transfusions on the days of her dialysis (Tues, Thurs and Sat). Her ANC is 2800. She was advised extensively on symptoms of Neutropenia.  Her hemoglobin is 10.6. Her plts are up to 130.  3. ESRDz.  --Likely secondary to #1. Continue hemodialysis on Tues, Thursday and Sat.   4. Psuedohyponatremia.  --Likely due to #1. Treat underlying disorder. Her sodium is stable at 131   5. History of Noncompliance.  -- Ongoing problem but we are trying to work with her.  6. Lower extremity swelling. --.  May require additional fluid pulled off with h/d. Lasix prn.  7. Poor nutrition. --She has had dental pain with resolution which may in part explain some weight lost.  We will refer to nutrition with consideration for nutritional supplementation with boost and/or ensure. She will be referred to oral surgeon. She is not on bisphosphonate currently.  8. Hyperkalemia. --HD Tomorrow k+ 4.8  9. Follow up.  --Patient will follow up in our office next week on 08/09/2014 can check her counts.   All questions were answered. The patient knows to call the clinic with any problems, questions or concerns. We can certainly see the patient much sooner if necessary.  I spent 20 minutes counseling the patient face to face. The total time spent in the appointment was 25 minutes.    Bernadene Bell, MD Medical Hematologist/Oncologist [facility] Pager: [phone] Office No: [phone]

## 2014-08-02 NOTE — Telephone Encounter (Signed)
gv adn printed appt sched and avs fo rpt for NOV.....sed added tx. °

## 2014-08-02 NOTE — Progress Notes (Signed)
[place] Work  Clinical Social Work was referred by [employer], scheduling, for transportation.  Clinical Social Worker met with patient in infusion room to offer support and assess for needs.  [name] shared she has been having difficulty utilizing [facility] transportation.  She has used [facility] in the past and can no longer afford to pay for transportation.   CSW assisted patient in requesting transportation through [employer].  CSW requested rides for the dates below.  CSW will follow up with patient once confirmed by [facility].     08/06/2014 Fri  1:00 P 15 INJECTION [1081] [facility] IN[facility] NURSE       S 08/09/2014 Mon 12:45 P 15 LAB MO [1477] [facility]      S 08/09/2014 Mon  1:15 P 30 EST PT 30 [965] [facility]      S 08/09/2014 Mon  2:00 P 90 INFUSION 1H 84M [1466] [facility]       S 08/11/2014 Wed  1:15 P 90 INFUSION 1H 84M [1466] [facility]      S 08/13/2014 Fri 11:00 A 15 INJECTION [1081] [facility] INJ       S 08/23/2014 Mon 10:30 A 15 LAB MO [1477] [facility]       S 08/23/2014 Mon 11:00 A 30 EST PT 30 [965] [facility]       S 08/23/2014 Mon 11:45 A 90 INFUSION 1H 84M [1466]      S 08/25/2014 Wed 11:00 A 90 INFUSION 1H 84M [1466]   S 08/27/2014 Fri 10:15 A 15 INJECTION [1081] [facility] INJ       S 08/30/2014 Mon 11:00 A 15 LAB MO [1477] [facility]       S 08/30/2014 Mon 11:30 A 30 EST PT 30 [965] [facility]       S 08/30/2014 Mon 12:15 P 90 INFUSION 1H 84M [1466]      S 09/01/2014 Wed 11:00 A 90 INFUSION 1H 84M [1466]     S 09/03/2014 Fri 10:30 A 15 INJECTION [1081] [facility] INJ      S 09/06/2014 Mon 10:00 A 15 LAB MO [1477] [facility]      S 09/06/2014 Mon 10:30 A 30 EST PT 30 [965] [facility]      S 09/06/2014 Mon 11:15 A 90 INFUSION 1H 84M [1466]     S 09/08/2014 Wed 10:30 A 90 INFUSION 1H 84M [1466]       S 09/10/2014 Fri 11:00 A 15 INJECTION [1081] [facility] INJ      Polo Riley, MSW, LCSW, OSW-C Clinical Social Worker [facility] [phone]

## 2014-08-02 NOTE — Progress Notes (Signed)
Okay to treat with creatinine of 9.2 per Dr. Lona Kettle.

## 2014-08-02 NOTE — Patient Instructions (Signed)
[facility] Discharge Instructions for Patients Receiving Chemotherapy  Today you received the following chemotherapy agents Kyprolis To help prevent nausea and vomiting after your treatment, we encourage you to take your nausea medication as prescribed.  If you develop nausea and vomiting that is not controlled by your nausea medication, call the clinic.   BELOW ARE SYMPTOMS THAT SHOULD BE REPORTED IMMEDIATELY:  *FEVER GREATER THAN 100.5 F  *CHILLS WITH OR WITHOUT FEVER  NAUSEA AND VOMITING THAT IS NOT CONTROLLED WITH YOUR NAUSEA MEDICATION  *UNUSUAL SHORTNESS OF BREATH  *UNUSUAL BRUISING OR BLEEDING  TENDERNESS IN MOUTH AND THROAT WITH OR WITHOUT PRESENCE OF ULCERS  *URINARY PROBLEMS  *BOWEL PROBLEMS  UNUSUAL RASH Items with * indicate a potential emergency and should be followed up as soon as possible.  Feel free to call the clinic you have any questions or concerns. The clinic phone number is [phone].    

## 2014-08-04 ENCOUNTER — Ambulatory Visit (HOSPITAL_BASED_OUTPATIENT_CLINIC_OR_DEPARTMENT_OTHER): Payer: Medicare Other

## 2014-08-04 DIAGNOSIS — C9000 Multiple myeloma not having achieved remission: Secondary | ICD-10-CM

## 2014-08-04 DIAGNOSIS — Z5112 Encounter for antineoplastic immunotherapy: Secondary | ICD-10-CM

## 2014-08-04 MED ORDER — DEXAMETHASONE SODIUM PHOSPHATE 20 MG/5ML IJ SOLN
INTRAMUSCULAR | Status: AC
  Filled 2014-08-04: qty 5

## 2014-08-04 MED ORDER — DEXAMETHASONE SODIUM PHOSPHATE 10 MG/ML IJ SOLN
10.0000 mg | Freq: Once | INTRAMUSCULAR | Status: AC
  Administered 2014-08-04: 10 mg via INTRAVENOUS

## 2014-08-04 MED ORDER — ONDANSETRON 8 MG/50ML IVPB (CHCC)
8.0000 mg | Freq: Once | INTRAVENOUS | Status: AC
  Administered 2014-08-04: 8 mg via INTRAVENOUS

## 2014-08-04 MED ORDER — DEXAMETHASONE SODIUM PHOSPHATE 10 MG/ML IJ SOLN
INTRAMUSCULAR | Status: AC
  Filled 2014-08-04: qty 1

## 2014-08-04 MED ORDER — SODIUM CHLORIDE 0.9 % IV SOLN
Freq: Once | INTRAVENOUS | Status: AC
  Administered 2014-08-04: 16:00:00 via INTRAVENOUS

## 2014-08-04 MED ORDER — DEXTROSE 5 % IV SOLN
15.0000 mg/m2 | Freq: Once | INTRAVENOUS | Status: AC
  Administered 2014-08-04: 26 mg via INTRAVENOUS
  Filled 2014-08-04: qty 13

## 2014-08-04 MED ORDER — ONDANSETRON 8 MG/NS 50 ML IVPB
INTRAVENOUS | Status: AC
  Filled 2014-08-04: qty 8

## 2014-08-04 NOTE — Patient Instructions (Signed)
[facility] Discharge Instructions for Patients Receiving Chemotherapy  Today you received the following chemotherapy agents: Kyprolis.  To help prevent nausea and vomiting after your treatment, we encourage you to take your nausea medication: Compazine 10 mg every 6 hours as needed. And Zofran 8mg  every 12 hours as needed.   If you develop nausea and vomiting that is not controlled by your nausea medication, call the clinic.   BELOW ARE SYMPTOMS THAT SHOULD BE REPORTED IMMEDIATELY:  *FEVER GREATER THAN 100.5 F  *CHILLS WITH OR WITHOUT FEVER  NAUSEA AND VOMITING THAT IS NOT CONTROLLED WITH YOUR NAUSEA MEDICATION  *UNUSUAL SHORTNESS OF BREATH  *UNUSUAL BRUISING OR BLEEDING  TENDERNESS IN MOUTH AND THROAT WITH OR WITHOUT PRESENCE OF ULCERS  *URINARY PROBLEMS  *BOWEL PROBLEMS  UNUSUAL RASH Items with * indicate a potential emergency and should be followed up as soon as possible.  Feel free to call the clinic you have any questions or concerns. The clinic phone number is [phone].

## 2014-08-06 ENCOUNTER — Ambulatory Visit: Payer: Self-pay

## 2014-08-06 NOTE — Telephone Encounter (Signed)
RECEIVED A FAX FROM [facility] CONCERNING A CONFIRMATION OF PRESCRIPTION SHIPMENT FOR REVLIMID ON 08/05/14.

## 2014-08-09 ENCOUNTER — Ambulatory Visit (HOSPITAL_BASED_OUTPATIENT_CLINIC_OR_DEPARTMENT_OTHER): Payer: Medicare Other | Admitting: Hematology

## 2014-08-09 ENCOUNTER — Other Ambulatory Visit (HOSPITAL_BASED_OUTPATIENT_CLINIC_OR_DEPARTMENT_OTHER): Payer: Medicare Other

## 2014-08-09 ENCOUNTER — Ambulatory Visit (HOSPITAL_BASED_OUTPATIENT_CLINIC_OR_DEPARTMENT_OTHER): Payer: Medicare Other

## 2014-08-09 ENCOUNTER — Encounter: Payer: Self-pay | Admitting: Hematology

## 2014-08-09 VITALS — BP 117/66 | HR 53 | Temp 98.3°F | Resp 18 | Ht 67.0 in | Wt 144.5 lb

## 2014-08-09 DIAGNOSIS — C9000 Multiple myeloma not having achieved remission: Secondary | ICD-10-CM

## 2014-08-09 DIAGNOSIS — Z5112 Encounter for antineoplastic immunotherapy: Secondary | ICD-10-CM

## 2014-08-09 DIAGNOSIS — D61818 Other pancytopenia: Secondary | ICD-10-CM

## 2014-08-09 DIAGNOSIS — D649 Anemia, unspecified: Secondary | ICD-10-CM

## 2014-08-09 DIAGNOSIS — E871 Hypo-osmolality and hyponatremia: Secondary | ICD-10-CM

## 2014-08-09 DIAGNOSIS — N186 End stage renal disease: Secondary | ICD-10-CM

## 2014-08-09 DIAGNOSIS — E875 Hyperkalemia: Secondary | ICD-10-CM

## 2014-08-09 DIAGNOSIS — M7989 Other specified soft tissue disorders: Secondary | ICD-10-CM

## 2014-08-09 LAB — CBC WITH DIFFERENTIAL/PLATELET
BASO%: 0.4 % (ref 0.0–2.0)
Basophils Absolute: 0 10*3/uL (ref 0.0–0.1)
EOS ABS: 0.1 10*3/uL (ref 0.0–0.5)
EOS%: 2 % (ref 0.0–7.0)
HEMATOCRIT: 34.4 % — AB (ref 34.8–46.6)
HGB: 10.9 g/dL — ABNORMAL LOW (ref 11.6–15.9)
LYMPH#: 1.2 10*3/uL (ref 0.9–3.3)
LYMPH%: 25.6 % (ref 14.0–49.7)
MCH: 32.7 pg (ref 25.1–34.0)
MCHC: 31.5 g/dL (ref 31.5–36.0)
MCV: 104 fL — ABNORMAL HIGH (ref 79.5–101.0)
MONO#: 0.3 10*3/uL (ref 0.1–0.9)
MONO%: 6.9 % (ref 0.0–14.0)
NEUT%: 65.1 % (ref 38.4–76.8)
NEUTROS ABS: 3 10*3/uL (ref 1.5–6.5)
Platelets: 169 10*3/uL (ref 145–400)
RBC: 3.31 10*6/uL — ABNORMAL LOW (ref 3.70–5.45)
RDW: 16.3 % — ABNORMAL HIGH (ref 11.2–14.5)
WBC: 4.6 10*3/uL (ref 3.9–10.3)

## 2014-08-09 LAB — HOLD TUBE, BLOOD BANK

## 2014-08-09 LAB — COMPREHENSIVE METABOLIC PANEL (CC13)
ALBUMIN: 2.9 g/dL — AB (ref 3.5–5.0)
ALK PHOS: 36 U/L — AB (ref 40–150)
ALT: 8 U/L (ref 0–55)
ANION GAP: 14 meq/L — AB (ref 3–11)
AST: 11 U/L (ref 5–34)
BUN: 74 mg/dL — ABNORMAL HIGH (ref 7.0–26.0)
CALCIUM: 8.3 mg/dL — AB (ref 8.4–10.4)
CHLORIDE: 98 meq/L (ref 98–109)
CO2: 22 meq/L (ref 22–29)
Creatinine: 12.7 mg/dL (ref 0.6–1.1)
GLUCOSE: 88 mg/dL (ref 70–140)
POTASSIUM: 4.9 meq/L (ref 3.5–5.1)
SODIUM: 133 meq/L — AB (ref 136–145)
TOTAL PROTEIN: 7.8 g/dL (ref 6.4–8.3)
Total Bilirubin: 0.39 mg/dL (ref 0.20–1.20)

## 2014-08-09 MED ORDER — ONDANSETRON 8 MG/50ML IVPB (CHCC)
8.0000 mg | Freq: Once | INTRAVENOUS | Status: AC
  Administered 2014-08-09: 8 mg via INTRAVENOUS

## 2014-08-09 MED ORDER — DEXAMETHASONE SODIUM PHOSPHATE 10 MG/ML IJ SOLN
INTRAMUSCULAR | Status: AC
  Filled 2014-08-09: qty 1

## 2014-08-09 MED ORDER — DEXTROSE 5 % IV SOLN
15.0000 mg/m2 | Freq: Once | INTRAVENOUS | Status: AC
  Administered 2014-08-09: 26 mg via INTRAVENOUS
  Filled 2014-08-09: qty 13

## 2014-08-09 MED ORDER — SODIUM CHLORIDE 0.9 % IV SOLN: Freq: Once | INTRAVENOUS | Status: DC

## 2014-08-09 MED ORDER — SODIUM CHLORIDE 0.9 % IV SOLN
Freq: Once | INTRAVENOUS | Status: AC
  Administered 2014-08-09: 15:00:00 via INTRAVENOUS

## 2014-08-09 MED ORDER — DEXAMETHASONE SODIUM PHOSPHATE 10 MG/ML IJ SOLN
10.0000 mg | Freq: Once | INTRAMUSCULAR | Status: AC
  Administered 2014-08-09: 10 mg via INTRAVENOUS

## 2014-08-09 MED ORDER — ONDANSETRON 8 MG/NS 50 ML IVPB
INTRAVENOUS | Status: AC
  Filled 2014-08-09: qty 8

## 2014-08-09 NOTE — Patient Instructions (Signed)
[facility] Discharge Instructions for Patients Receiving Chemotherapy  Today you received the following chemotherapy agents: Kyprolis.  To help prevent nausea and vomiting after your treatment, we encourage you to take your nausea medication: Compazine 10 mg every 6 hours as needed. And Zofran 8mg  every 12 hours as needed.   If you develop nausea and vomiting that is not controlled by your nausea medication, call the clinic.   BELOW ARE SYMPTOMS THAT SHOULD BE REPORTED IMMEDIATELY:  *FEVER GREATER THAN 100.5 F  *CHILLS WITH OR WITHOUT FEVER  NAUSEA AND VOMITING THAT IS NOT CONTROLLED WITH YOUR NAUSEA MEDICATION  *UNUSUAL SHORTNESS OF BREATH  *UNUSUAL BRUISING OR BLEEDING  TENDERNESS IN MOUTH AND THROAT WITH OR WITHOUT PRESENCE OF ULCERS  *URINARY PROBLEMS  *BOWEL PROBLEMS  UNUSUAL RASH Items with * indicate a potential emergency and should be followed up as soon as possible.  Feel free to call the clinic you have any questions or concerns. The clinic phone number is [phone].

## 2014-08-09 NOTE — Progress Notes (Signed)
[place] OFFICE PROGRESS NOTE Date of Visit: 08/09/2014   DIAGNOSIS: Multiple Myeloma for follow up, ESRD on HD T/T/S schedule.  CURRENT TREATMENT:  Carfilzomib 15 mg/m2 on days 1,3, 8, 10,15, 17 (her dialysis is on day 2); Dexamethasone 12 mg weekly on days 1, 8, 15, 22; revlimid 5 mg daily for 28 days.  She was last seen by me on 08/02/2014.  Carfilzomib started on 03/29/2014.  Her Cycle 5 started on 07/26/2014. She gets her chemotherapy on Monday and Wednesday and Neupogen shot on Friday. This happens for 3 weeks and then she had 1 week off. Her myeloma markers are showing improvement with IgG and M protein trending down suggestive of partial response. Today is her cycle 5 day 15 and then she gets treatment on 08/11/14 day 17 that would be last treatment for cycle 5.               Multiple myeloma   03/30/2008 Initial Diagnosis Multiple myeloma   03/30/2008 - 05/01/2012 Chemotherapy Revlimid/Dex then Velcade (started on 07/13/2011 ending on 09/14/2011) without response.  Managed by Dr. Chauncey Fischer of [place], [place] [address]   07/24/2010 -  Chemotherapy Received zometa  intermittently over one year.  Creatinine was 1.0 on 07/24/2010. 1.7 on 08/10/2011.    05/01/2012 - 09/01/2012 Chemotherapy Starte Pomalidomide s/p 4 cycles. Referred to [facility] for consideration of ASCT.     10/08/2012 Bone Marrow Biopsy Normocellular marrow with residual/recurrently plasma cell myeloma, 60-70 % of overall marrow celluarity.  Flow: c/w plasma cell neoplasm, plasma cell infiltrate comprises 60-70 % of overall marrow cellularity; FISH: + for 1 q21/CKS1B gain; RB1 (13q14)   10/08/2012 Bone Marrow Biopsy Continued... Cytogenetics showed normal female karyotype.    10/29/2012 Tumor Marker High IgG (6019), high lamda free light chain (409.32) with low IgA, IgM and kappa free light chain.    04/21/2013 Imaging Skeletal survey.  No suspcious lytic or blastic lesions visualized.   Severe multilevel degenerative disc disease in the cervical spine.    04/29/2013 Imaging PeT. No discrete suspicious focus of FDG uptake identified.    05/20/2013 - 06/09/2013 Chemotherapy Started carfilxomib 39 mg on 08/25, 08/26, day 8 (06/02/13), day 9 on 06/03/2013 and on 06/09/2013.     12/03/2013 Treatment Plan Change Last office visit with Dr. Glenna Durand.  She decline further chemotherapy.   Her son passed away and she relocated to [place] to stay with her daughter [name].    03/11/2014 - 03/17/2014 Hospital Admission Admitted for low hemoglobin. Dr. Burney Gauze consulted.  SPEP,  UPEP and Bone marrow biopsy obtained.    IgG 11,300. M-spike 7.52, kappa lamda ratio 0 with lambda free light chains totaling 1260.    03/12/2014 Imaging Skeletal survey. 1. Small lytic lesions within the skull and right scapula. 2. Degenerative changes in the spine and knees, right greater than left. 3. Cardiomegaly without pulmonary edema.   03/17/2014 Bone Marrow Biopsy Hypercellular bone marrow tih extensive involvment by plasma cell neoplasm (Plasma cells 85%). FISH: Presence of 2 copies of the IGH/FGFR3, ATM, CCND1/IGH, and p53 probes.  Loss of 13q34 probes in 40% of cells. gain of chromose 12 and either loss of chorm   03/25/2014 -  Chemotherapy Planning Kyprolis 15 mg/m2 plus dexamethasone weekly plus revlimid (12m) daily on 06/29.   06/14/2014 -  Chemotherapy Patient's last dose of Kyprolis was on 05/26/14. Since then chemo held due to neutropenia despite GCSF support. I will hold Revlimid also as of 06/14/14  06/28/2014 -  Chemotherapy New cycle of Kyprolis starts today.   07/07/2014 Tumor Marker IgG 3960    07/23/2014 Tumor Marker IgG 3620     INTERVAL HISTORY:  [name] 73 y.o. female with a history of IgG Lambda Multiple myeloma complicated by non-compliance, ESRDz on H/D(Tues, Thursday and Sat) is here for follow up. She was initially consulted by [place] on 03/17/14. She was last seen here on  07/23/2014.  Today,she reports feeling much better with improved energy.  She reports seeing dentistry and completing a course of antibiotics with resolution of her dental pain.  She denies fevers or chills. She had a recent hospitalization (6/11 - 03/17/2014) due to profound anemia. She was transfused packed RBCs doing this admission.   She complains of swelling in her ankles. She undergoes dialysis every Tuesday, Thursday and Sat. Her revlimid dose was reduced 50%.  No new problems. She has been taking dexamethasone weekly and revlimid at 5 mg daily dose.   MEDICAL HISTORY: Past Medical History  Diagnosis Date  . Renal disorder   . ESRD (end stage renal disease)   . Hypertension   . Multiple myeloma 2009  . Thyroid disease   . Hyperparathyroidism   . Thrombocytopenia   . Anemia   . Shortness of breath   . Pneumonia   . GERD (gastroesophageal reflux disease)   . 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    ALLERGIES:  has No Known Allergies.  MEDICATIONS: has a current medication list which includes the following prescription(s): acetaminophen-codeine, acyclovir, amlodipine, aspirin ec, cyproheptadine, dexamethasone, furosemide, hydralazine, hydrocodone-homatropine, isosorbide mononitrate, lenalidomide, lorazepam, metolazone, multiple vitamins-iron, ondansetron, prochlorperazine, and sevelamer carbonate, and the following Facility-Administered Medications: sodium chloride and carfilzomib (KYPROLIS) 26 mg in dextrose 5 % 50 mL chemo infusion.  SURGICAL HISTORY:  Past Surgical History  Procedure Laterality Date  . Abdominal hysterectomy      Pt. denies    REVIEW OF SYSTEMS:   Constitutional: Denies fevers, chills or abnormal weight loss. States that they pulled too much  fluid off her during dialysis. Eyes: Denies blurriness of vision Ears, nose, mouth, throat, and face: Denies mucositis or sore throat Respiratory: Denies cough, dyspnea or wheezes Cardiovascular: Denies palpitation, chest discomfort or lower extremity swelling Gastrointestinal:  Denies nausea, heartburn or change in bowel habits Skin: Denies abnormal skin rashes Lymphatics: Denies new lymphadenopathy or easy bruising Neurological:Denies numbness, tingling or new weaknesses Behavioral/Psych: Mood is stable, no new changes  All other systems were reviewed with the patient and are negative.  PHYSICAL EXAMINATION:    ECOG PEFORMANCE STATUS: 1  GENERAL:alert, no distress and comfortable; well developed and well nourished. Anxious SKIN: skin color, texture, turgor are normal, no rashes or significant lesions EYES: normal, Conjunctiva are pink and non-injected, sclera clear OROPHARYNX:no exudate, no erythema and lips, buccal mucosa, and tongue normal  NECK: supple, thyroid normal size, non-tender, without nodularity LYMPH:  no palpable lymphadenopathy in the cervical, axillary or supraclavicular LUNGS: clear to auscultation with normal breathing effort, no wheezes or rhonchi HEART: regular rate & rhythm and no murmurs and 1+ edema in ankles bilaterally; L AVF ABDOMEN:abdomen soft, non-tender and normal bowel sounds Musculoskeletal:no cyanosis of digits and no clubbing  NEURO: alert & oriented x 3 with fluent speech, no focal motor/sensory deficits  RADIOGRAPHIC STUDIES:    Dg Bone Survey Met  03/12/2014   CLINICAL DATA:  Myeloma.  Right knee pain.  EXAM: METASTATIC BONE SURVEY  COMPARISON:  None.  FINDINGS: Multiple images are performed of the axial and appendicular skeleton.  Multiple small lytic lesions are identified within the skull consistent with myeloma. A small lytic lesion is identified within the right scapula.  Degenerative changes are identified within the mid cervical spine.  No suspicious lytic or blastic lesions are identified within the spine, or long bones. Surgical clips are identified in the left upper arm. Degenerative changes are seen in the knees bilaterally, right greater than left.  The heart is mildly enlarged. There are areas of atelectasis within the right lung. No focal consolidations or pulmonary edema.  IMPRESSION: 1. Small lytic lesions within the skull and right scapula. 2. Degenerative changes in the spine and knees, right greater than left. 3. Cardiomegaly without pulmonary edema.   Electronically Signed   By: Shon Hale M.D.   On: 03/12/2014 10:13     ASSESSMENT: [name] 73 y.o. female with a history of Multiple Myeloma and ESRD on HD currently on a modified regimen of Dexamethasone, Revlimid and Carfilzomib. Chemo held last few weeks due to neutropenia from chemo and Revlimid. She has transport issues and on T/T/S she goes for hemodialysis. She will start cycle 4 today on 06/28/14 as counts have recovered. The platelets have already recovered and the white count is normal. She will continue dexamethasone which has anti-myeloma properties as well.She will resume low dose Revlimid today.  PLAN:   1. IgG Lambda MM, rapidly progressive.  --Her Bone marrow is packed with 85% plasma cells likely explaining her refractory anemia and pancytopenia. Her protein was 14.7 down to 12.5 to 10.3 to 3.9 grams  since starting chemotherapy. Her case has been difficult due to a history of non-compliance. She stated that she stopped her recent treatment in [place] due to being hospitalized. Per records from her primary oncologist's office, it was likely due to her kidney failure due to progression of her multiple myeloma. Her last treatment is as noted above. Considering she received only one cycle of carfilzomib plus dexamethasone, we started her back on this regiment based on Nicole Kindred et. Al, NEJM, 2015).   *Carfilzomib (15 mg/m2) on days 1, 3, 8, 10, 15, 17   *Lenalidomide 24m daily (please take follow H/D on Tue/th/Sa). --dose reduced by 50% due to delayed count recovery. Revlimid is now being held as of today 06/14/14 *Dexamethasone 12 mg weekly  We presented the treatment indications, benefits and side-effects including but not limited to myelosuprresion which can result in life-threatening infections and anemia and low plts, and nausea or vomiting or rashes. Continue acyclovir for antiviral prophylaxis.  She understood the indications, risks and benefits and chose to proceed. She started therapy on 03/29/2014. .Marland KitchenHer IgG level on 05/24/2014 was down to 4.8 grams (4820 mg) from 11.3 grams (11300 mg) in June 2015.  IgG on 07/23/2014 was 3620 trending down.  She is on cycle 6 now week 3. She will get two dose of Carfilzomib this week and neupogen on Friday to complete this cycle and we will see her again next week on 08/23/14.  2. Pancytopenia with anemia likely multifactorial.  --Secondary to #1. We will start on aranesp biweekly if not provided with her H/D.  She cannot have transfusions on the days of her dialysis (Tues, Thurs and Sat). Her ANC is 2800. She was advised  extensively on symptoms of Neutropenia.  Her hemoglobin is 10.6. Her plts are up to 130.  3. ESRDz.  --Likely secondary to #1. Continue hemodialysis on Tues, Thursday and Sat.   4. Psuedohyponatremia.  --Likely due to #1. Treat underlying disorder. Her sodium is stable at 131   5. History of Noncompliance.  -- Ongoing problem but we are trying to work with her.  6. Lower extremity swelling. --.  May require additional fluid pulled off with h/d. Lasix prn.  7. Poor nutrition. --She has had dental pain with resolution which may in part explain some weight lost.  We will refer to nutrition with consideration for nutritional supplementation with boost and/or ensure. She will be referred to oral surgeon. She is not on bisphosphonate currently.  8. Hyperkalemia. --HD Tomorrow k+  4.9  9. Follow up.  --Patient will follow up in 2 weeks on 08/23/2014 to restart cycle 6.   All questions were answered. The patient knows to call the clinic with any problems, questions or concerns. We can certainly see the patient much sooner if necessary.  I spent 20 minutes counseling the patient face to face. The total time spent in the appointment was 25 minutes.    Bernadene Bell, MD Medical Hematologist/Oncologist [place] Pager: [phone] Office No: [phone]

## 2014-08-11 ENCOUNTER — Ambulatory Visit (HOSPITAL_BASED_OUTPATIENT_CLINIC_OR_DEPARTMENT_OTHER): Payer: Medicare Other

## 2014-08-11 DIAGNOSIS — Z5112 Encounter for antineoplastic immunotherapy: Secondary | ICD-10-CM

## 2014-08-11 DIAGNOSIS — C9000 Multiple myeloma not having achieved remission: Secondary | ICD-10-CM

## 2014-08-11 MED ORDER — ONDANSETRON 8 MG/50ML IVPB (CHCC)
8.0000 mg | Freq: Once | INTRAVENOUS | Status: AC
  Administered 2014-08-11: 8 mg via INTRAVENOUS

## 2014-08-11 MED ORDER — SODIUM CHLORIDE 0.9 % IV SOLN
Freq: Once | INTRAVENOUS | Status: AC
  Administered 2014-08-11: 14:00:00 via INTRAVENOUS

## 2014-08-11 MED ORDER — ONDANSETRON 8 MG/NS 50 ML IVPB
INTRAVENOUS | Status: AC
  Filled 2014-08-11: qty 8

## 2014-08-11 MED ORDER — DEXAMETHASONE SODIUM PHOSPHATE 10 MG/ML IJ SOLN
INTRAMUSCULAR | Status: AC
  Filled 2014-08-11: qty 1

## 2014-08-11 MED ORDER — SODIUM CHLORIDE 0.9 % IV SOLN: Freq: Once | INTRAVENOUS | Status: DC

## 2014-08-11 MED ORDER — DEXTROSE 5 % IV SOLN
15.0000 mg/m2 | Freq: Once | INTRAVENOUS | Status: AC
  Administered 2014-08-11: 26 mg via INTRAVENOUS
  Filled 2014-08-11: qty 13

## 2014-08-11 MED ORDER — DEXAMETHASONE SODIUM PHOSPHATE 10 MG/ML IJ SOLN
10.0000 mg | Freq: Once | INTRAMUSCULAR | Status: AC
  Administered 2014-08-11: 10 mg via INTRAVENOUS

## 2014-08-11 NOTE — Patient Instructions (Signed)
[facility] Discharge Instructions for Patients Receiving Chemotherapy  Today you received the following chemotherapy agents: Kyprolis  To help prevent nausea and vomiting after your treatment, we encourage you to take your nausea medication: as directed.   If you develop nausea and vomiting that is not controlled by your nausea medication, call the clinic.   BELOW ARE SYMPTOMS THAT SHOULD BE REPORTED IMMEDIATELY:  *FEVER GREATER THAN 100.5 F  *CHILLS WITH OR WITHOUT FEVER  NAUSEA AND VOMITING THAT IS NOT CONTROLLED WITH YOUR NAUSEA MEDICATION  *UNUSUAL SHORTNESS OF BREATH  *UNUSUAL BRUISING OR BLEEDING  TENDERNESS IN MOUTH AND THROAT WITH OR WITHOUT PRESENCE OF ULCERS  *URINARY PROBLEMS  *BOWEL PROBLEMS  UNUSUAL RASH Items with * indicate a potential emergency and should be followed up as soon as possible.  Feel free to call the clinic you have any questions or concerns. The clinic phone number is [phone].    

## 2014-08-13 ENCOUNTER — Ambulatory Visit (HOSPITAL_BASED_OUTPATIENT_CLINIC_OR_DEPARTMENT_OTHER): Payer: Medicare Other

## 2014-08-13 DIAGNOSIS — D61818 Other pancytopenia: Secondary | ICD-10-CM

## 2014-08-13 DIAGNOSIS — C9000 Multiple myeloma not having achieved remission: Secondary | ICD-10-CM

## 2014-08-13 MED ORDER — FILGRASTIM 300 MCG/0.5ML IJ SOSY
300.0000 ug | PREFILLED_SYRINGE | Freq: Once | INTRAMUSCULAR | Status: AC
  Administered 2014-08-13: 300 ug via SUBCUTANEOUS
  Filled 2014-08-13: qty 0.5

## 2014-08-13 NOTE — Patient Instructions (Signed)
Filgrastim, G-CSF injection  What is this medicine?  FILGRASTIM, G-CSF (fil GRA stim) is a granulocyte colony-stimulating factor that stimulates the growth of neutrophils, a type of white blood cell (WBC) important in the body's fight against infection. It is used to reduce the incidence of fever and infection in patients with certain types of cancer who are receiving chemotherapy that affects the bone marrow, to stimulate blood cell production for removal of WBCs from the body prior to a bone marrow transplantation, to reduce the incidence of fever and infection in patients who have severe chronic neutropenia, and to improve survival outcomes following high-dose radiation exposure that is toxic to the bone marrow.  This medicine may be used for other purposes; ask your health care provider or pharmacist if you have questions.  COMMON BRAND NAME(S): Neupogen  What should I tell my health care provider before I take this medicine?  They need to know if you have any of these conditions:  -latex allergy  -ongoing radiation therapy  -sickle cell disease  -an unusual or allergic reaction to filgrastim, pegfilgrastim, other medicines, foods, dyes, or preservatives  -pregnant or trying to get pregnant  -breast-feeding  How should I use this medicine?  This medicine is for injection under the skin. If you get this medicine at home, you will be taught how to prepare and give this medicine. Refer to the Instructions for Use that come with your medication packaging. Use exactly as directed. Take your medicine at regular intervals. Do not take your medicine more often than directed.  It is important that you put your used needles and syringes in a special sharps container. Do not put them in a trash can. If you do not have a sharps container, call your pharmacist or healthcare provider to get one.  Talk to your pediatrician regarding the use of this medicine in children. While this drug may be prescribed for children as young  as 7 months for selected conditions, precautions do apply.  Overdosage: If you think you have taken too much of this medicine contact a poison control center or emergency room at once.  NOTE: This medicine is only for you. Do not share this medicine with others.  What if I miss a dose?  It is important not to miss your dose. Call your doctor or health care professional if you miss a dose.  What may interact with this medicine?  This medicine may interact with the following medications:  -medicines that may cause a release of neutrophils, such as lithium  This list may not describe all possible interactions. Give your health care provider a list of all the medicines, herbs, non-prescription drugs, or dietary supplements you use. Also tell them if you smoke, drink alcohol, or use illegal drugs. Some items may interact with your medicine.  What should I watch for while using this medicine?  You may need blood work done while you are taking this medicine.  What side effects may I notice from receiving this medicine?  Side effects that you should report to your doctor or health care professional as soon as possible:  -allergic reactions like skin rash, itching or hives, swelling of the face, lips, or tongue  -dizziness or feeling faint  -fever  -pain, redness, or irritation at site where injected  -pinpoint red spots on the skin  -shortness of breath or breathing problems  -stomach or side pain, or pain at the shoulder  -swelling  -tiredness  -trouble passing urine  -unusual   bleeding or bruising  Side effects that usually do not require medical attention (report to your doctor or health care professional if they continue or are bothersome):  -bone pain  -headache  -muscle pain  This list may not describe all possible side effects. Call your doctor for medical advice about side effects. You may report side effects to FDA at 1-800-FDA-1088.  Where should I keep my medicine?  Keep out of the reach of children.  Store in a  refrigerator between 2 and 8 degrees C (36 and 46 degrees F). Do not freeze. Keep in carton to protect from light. Throw away this medicine if vials or syringes are left out of the refrigerator for more than 24 hours. Throw away any unused medicine after the expiration date.  NOTE: This sheet is a summary. It may not cover all possible information. If you have questions about this medicine, talk to your doctor, pharmacist, or health care provider.   2015, Elsevier/Gold Standard. (2014-01-01 17:00:01)

## 2014-08-23 ENCOUNTER — Ambulatory Visit (HOSPITAL_BASED_OUTPATIENT_CLINIC_OR_DEPARTMENT_OTHER): Payer: Medicare Other | Admitting: Hematology

## 2014-08-23 ENCOUNTER — Other Ambulatory Visit (HOSPITAL_BASED_OUTPATIENT_CLINIC_OR_DEPARTMENT_OTHER): Payer: Medicare Other

## 2014-08-23 ENCOUNTER — Ambulatory Visit (HOSPITAL_BASED_OUTPATIENT_CLINIC_OR_DEPARTMENT_OTHER): Payer: Medicare Other

## 2014-08-23 VITALS — BP 118/79 | HR 55 | Temp 98.6°F | Resp 18 | Ht 67.0 in | Wt 140.0 lb

## 2014-08-23 DIAGNOSIS — D61818 Other pancytopenia: Secondary | ICD-10-CM

## 2014-08-23 DIAGNOSIS — E875 Hyperkalemia: Secondary | ICD-10-CM

## 2014-08-23 DIAGNOSIS — Z992 Dependence on renal dialysis: Secondary | ICD-10-CM

## 2014-08-23 DIAGNOSIS — N186 End stage renal disease: Secondary | ICD-10-CM

## 2014-08-23 DIAGNOSIS — M7989 Other specified soft tissue disorders: Secondary | ICD-10-CM

## 2014-08-23 DIAGNOSIS — Z9119 Patient's noncompliance with other medical treatment and regimen: Secondary | ICD-10-CM

## 2014-08-23 DIAGNOSIS — C9000 Multiple myeloma not having achieved remission: Secondary | ICD-10-CM

## 2014-08-23 DIAGNOSIS — E871 Hypo-osmolality and hyponatremia: Secondary | ICD-10-CM

## 2014-08-23 DIAGNOSIS — Z5112 Encounter for antineoplastic immunotherapy: Secondary | ICD-10-CM

## 2014-08-23 DIAGNOSIS — D649 Anemia, unspecified: Secondary | ICD-10-CM

## 2014-08-23 DIAGNOSIS — E46 Unspecified protein-calorie malnutrition: Secondary | ICD-10-CM

## 2014-08-23 LAB — COMPREHENSIVE METABOLIC PANEL (CC13)
ALT: 10 U/L (ref 0–55)
ANION GAP: 13 meq/L — AB (ref 3–11)
AST: 15 U/L (ref 5–34)
Albumin: 3.1 g/dL — ABNORMAL LOW (ref 3.5–5.0)
Alkaline Phosphatase: 44 U/L (ref 40–150)
BILIRUBIN TOTAL: 0.51 mg/dL (ref 0.20–1.20)
BUN: 37 mg/dL — AB (ref 7.0–26.0)
CALCIUM: 9.2 mg/dL (ref 8.4–10.4)
CHLORIDE: 99 meq/L (ref 98–109)
CO2: 23 mEq/L (ref 22–29)
Creatinine: 8.9 mg/dL (ref 0.6–1.1)
Glucose: 91 mg/dl (ref 70–140)
Potassium: 4 mEq/L (ref 3.5–5.1)
Sodium: 135 mEq/L — ABNORMAL LOW (ref 136–145)
Total Protein: 7.6 g/dL (ref 6.4–8.3)

## 2014-08-23 LAB — CBC WITH DIFFERENTIAL/PLATELET
BASO%: 1 % (ref 0.0–2.0)
Basophils Absolute: 0 10*3/uL (ref 0.0–0.1)
EOS%: 4.6 % (ref 0.0–7.0)
Eosinophils Absolute: 0.1 10*3/uL (ref 0.0–0.5)
HCT: 31.8 % — ABNORMAL LOW (ref 34.8–46.6)
HGB: 10 g/dL — ABNORMAL LOW (ref 11.6–15.9)
LYMPH%: 40.6 % (ref 14.0–49.7)
MCH: 32.2 pg (ref 25.1–34.0)
MCHC: 31.4 g/dL — AB (ref 31.5–36.0)
MCV: 102.3 fL — ABNORMAL HIGH (ref 79.5–101.0)
MONO#: 0.3 10*3/uL (ref 0.1–0.9)
MONO%: 8.9 % (ref 0.0–14.0)
NEUT#: 1.4 10*3/uL — ABNORMAL LOW (ref 1.5–6.5)
NEUT%: 44.9 % (ref 38.4–76.8)
PLATELETS: 158 10*3/uL (ref 145–400)
RBC: 3.11 10*6/uL — ABNORMAL LOW (ref 3.70–5.45)
RDW: 16 % — ABNORMAL HIGH (ref 11.2–14.5)
WBC: 3 10*3/uL — ABNORMAL LOW (ref 3.9–10.3)
lymph#: 1.2 10*3/uL (ref 0.9–3.3)
nRBC: 0 % (ref 0–0)

## 2014-08-23 MED ORDER — DEXAMETHASONE SODIUM PHOSPHATE 10 MG/ML IJ SOLN
10.0000 mg | Freq: Once | INTRAMUSCULAR | Status: AC
  Administered 2014-08-23: 10 mg via INTRAVENOUS

## 2014-08-23 MED ORDER — ONDANSETRON 8 MG/50ML IVPB (CHCC)
8.0000 mg | Freq: Once | INTRAVENOUS | Status: AC
  Administered 2014-08-23: 8 mg via INTRAVENOUS

## 2014-08-23 MED ORDER — CARFILZOMIB CHEMO INJECTION 60 MG
15.0000 mg/m2 | Freq: Once | INTRAVENOUS | Status: AC
  Administered 2014-08-23: 26 mg via INTRAVENOUS
  Filled 2014-08-23: qty 13

## 2014-08-23 MED ORDER — SODIUM CHLORIDE 0.9 % IV SOLN
Freq: Once | INTRAVENOUS | Status: AC
  Administered 2014-08-23: 13:00:00 via INTRAVENOUS

## 2014-08-23 MED ORDER — DEXAMETHASONE SODIUM PHOSPHATE 10 MG/ML IJ SOLN
INTRAMUSCULAR | Status: AC
  Filled 2014-08-23: qty 1

## 2014-08-23 MED ORDER — ONDANSETRON 8 MG/NS 50 ML IVPB
INTRAVENOUS | Status: AC
  Filled 2014-08-23: qty 8

## 2014-08-23 NOTE — Patient Instructions (Signed)
Carfilzomib injection What is this medicine? CARFILZOMIB (kar FILZ oh mib) is a chemotherapy drug that works by slowing or stopping cancer cell growth. This medicine is used to treat multiple myeloma. This medicine may be used for other purposes; ask your health care provider or pharmacist if you have questions. COMMON BRAND NAME(S): KYPROLIS What should I tell my health care provider before I take this medicine? They need to know if you have any of these conditions: -heart disease -irregular heartbeat -liver disease -lung or breathing disease -an unusual or allergic reaction to carfilzomib, or other medicines, foods, dyes, or preservatives -pregnant or trying to get pregnant -breast-feeding How should I use this medicine? This medicine is for injection or infusion into a vein. It is given by a health care professional in a hospital or clinic setting. Talk to your pediatrician regarding the use of this medicine in children. Special care may be needed. Overdosage: If you think you've taken too much of this medicine contact a poison control center or emergency room at once. Overdosage: If you think you have taken too much of this medicine contact a poison control center or emergency room at once. NOTE: This medicine is only for you. Do not share this medicine with others. What if I miss a dose? It is important not to miss your dose. Call your doctor or health care professional if you are unable to keep an appointment. What may interact with this medicine? Interactions are not expected. Give your health care provider a list of all the medicines, herbs, non-prescription drugs, or dietary supplements you use. Also tell them if you smoke, drink alcohol, or use illegal drugs. Some items may interact with your medicine. This list may not describe all possible interactions. Give your health care provider a list of all the medicines, herbs, non-prescription drugs, or dietary supplements you use. Also  tell them if you smoke, drink alcohol, or use illegal drugs. Some items may interact with your medicine. What should I watch for while using this medicine? Your condition will be monitored carefully while you are receiving this medicine. Report any side effects. Continue your course of treatment even though you feel ill unless your doctor tells you to stop. Call your doctor or health care professional for advice if you get a fever, chills or sore throat, or other symptoms of a cold or flu. Do not treat yourself. Try to avoid being around people who are sick. Do not become pregnant while taking this medicine. Women should inform their doctor if they wish to become pregnant or think they might be pregnant. There is a potential for serious side effects to an unborn child. Talk to your health care professional or pharmacist for more information. Do not breast-feed an infant while taking this medicine. Check with your doctor or health care professional if you get an attack of severe diarrhea, nausea and vomiting, or if you sweat a lot. The loss of too much body fluid can make it dangerous for you to take this medicine. You may get dizzy. Do not drive, use machinery, or do anything that needs mental alertness until you know how this medicine affects you. Do not stand or sit up quickly, especially if you are an older patient. This reduces the risk of dizzy or fainting spells. What side effects may I notice from receiving this medicine? Side effects that you should report to your doctor or health care professional as soon as possible: -allergic reactions like skin rash, itching or hives,  swelling of the face, lips, or tongue -breathing problems -chest pain or palpitationschest tightness -cough -dark urine -dizziness -feeling faint or lightheaded -fever or chills -general ill feeling or flu-like symptoms -light-colored stools -palpitations -right upper belly pain -swelling of the legs or ankles -unusual  bleeding or bruising -unusually weak or tired -yellowing of the eyes or skin Side effects that usually do not require medical attention (Report these to your doctor or health care professional if they continue or are bothersome.): -diarrhea -headache -nausea, vomiting -tiredness This list may not describe all possible side effects. Call your doctor for medical advice about side effects. You may report side effects to FDA at 1-800-FDA-1088. Where should I keep my medicine? This drug is given in a hospital or clinic and will not be stored at home. NOTE: This sheet is a summary. It may not cover all possible information. If you have questions about this medicine, talk to your doctor, pharmacist, or health care provider.  2015, Elsevier/Gold Standard. (2012-03-07 17:02:29)

## 2014-08-23 NOTE — Progress Notes (Signed)
[facility] OFFICE PROGRESS NOTE Date of Visit: 08/23/2014   DIAGNOSIS: Multiple Myeloma for follow up, ESRD on HD T/T/S schedule.  CURRENT TREATMENT:  Carfilzomib 15 mg/m2 on days 1,3, 8, 10,15, 17 (her dialysis is on day 2); Dexamethasone 12 mg weekly on days 1, 8, 15, 22; revlimid 5 mg daily for 28 days.  She was last seen by me on 08/09/2014.  Carfilzomib started on 03/29/2014.  Her Cycle 5 started on 07/26/2014. She gets her chemotherapy on Monday and Wednesday and Neupogen shot on Friday. This happens for 3 weeks and then she had 1 week off. Her myeloma markers are showing improvement with IgG and M protein trending down suggestive of partial response. Today is her cycle 6 day 1 and then she gets treatment on 08/25/14 day 3. Her days are day 1+3+8+10+15+17 due to her dialysis chedule which is T/T/S. NEUPOGEN shots she gets on day 5,10,15 of each cycle on Fridays and that strategy has worked well to allow her white count to stay up so she can get her treatments. Myeloma markers were done today and pending.                 Multiple myeloma   03/30/2008 Initial Diagnosis Multiple myeloma   03/30/2008 - 05/01/2012 Chemotherapy Revlimid/Dex then Velcade (started on 07/13/2011 ending on 09/14/2011) without response.  Managed by Dr. Chauncey [name] of [place], [place] [address]   07/24/2010 -  Chemotherapy Received zometa  intermittently over one year.  Creatinine was 1.0 on 07/24/2010. 1.7 on 08/10/2011.    05/01/2012 - 09/01/2012 Chemotherapy Starte Pomalidomide s/p 4 cycles. Referred to [facility] for consideration of ASCT.     10/08/2012 Bone Marrow Biopsy Normocellular marrow with residual/recurrently plasma cell myeloma, 60-70 % of overall marrow celluarity.  Flow: c/w plasma cell neoplasm, plasma cell infiltrate comprises 60-70 % of overall marrow cellularity; FISH: + for 1 q21/CKS1B gain; RB1 (13q14)   10/08/2012 Bone Marrow Biopsy Continued... Cytogenetics showed  normal female karyotype.    10/29/2012 Tumor Marker High IgG (6019), high lamda free light chain (409.32) with low IgA, IgM and kappa free light chain.    04/21/2013 Imaging Skeletal survey.  No suspcious lytic or blastic lesions visualized.  Severe multilevel degenerative disc disease in the cervical spine.    04/29/2013 Imaging PeT. No discrete suspicious focus of FDG uptake identified.    05/20/2013 - 06/09/2013 Chemotherapy Started carfilxomib 39 mg on 08/25, 08/26, day 8 (06/02/13), day 9 on 06/03/2013 and on 06/09/2013.     12/03/2013 Treatment Plan Change Last office visit with Dr. Glenna [name].  She decline further chemotherapy.   Her son passed away and she relocated to [place] to stay with her daughter [name].    03/11/2014 - 03/17/2014 Hospital Admission Admitted for low hemoglobin. Dr. Burney [name] consulted.  SPEP,  UPEP and Bone marrow biopsy obtained.    IgG 11,300. M-spike 7.52, kappa lamda ratio 0 with lambda free light chains totaling 1260.    03/12/2014 Imaging Skeletal survey. 1. Small lytic lesions within the skull and right scapula. 2. Degenerative changes in the spine and knees, right greater than left. 3. Cardiomegaly without pulmonary edema.   03/17/2014 Bone Marrow Biopsy Hypercellular bone marrow tih extensive involvment by plasma cell neoplasm (Plasma cells 85%). FISH: Presence of 2 copies of the IGH/FGFR3, ATM, CCND1/IGH, and p53 probes.  Loss of 13q34 probes in 40% of cells. gain of chromose 12 and either loss of chorm   03/25/2014 -  Chemotherapy Planning Kyprolis 15 mg/m2 plus dexamethasone weekly plus revlimid (70m) daily on 06/29.   06/14/2014 -  Chemotherapy Patient's last dose of Kyprolis was on 05/26/14. Since then chemo held due to neutropenia despite GCSF support. I will hold Revlimid also as of 06/14/14   06/28/2014 -  Chemotherapy New cycle of Kyprolis starts today.   07/07/2014 Tumor Marker IgG 3960    07/23/2014 Tumor Marker IgG 3620     INTERVAL HISTORY:  BFredericka Bottcher73 y.o. female with a history of IgG Lambda Multiple myeloma complicated by non-compliance, ESRDz on H/D(Tues, Thursday and Sat) is here for follow up. She was initially consulted by [place] 03[place]17[place]. She was last seen here on 07/23/2014.  Today,she reports feeling much better with improved energy.  She reports seeing dentistry and completing a course of antibiotics with resolution of her dental pain.  She denies fevers or chills. She had a recent hospitalization (6/11 - 03/17/2014) due to profound anemia. She was transfused packed RBCs doing this admission.   She complains of swelling in her ankles. She undergoes dialysis every Tuesday, Thursday and Sat. Her revlimid dose was reduced 50%.  No new problems. She has been taking dexamethasone weekly and revlimid at 5 mg daily dose.   MEDICAL HISTORY: Past Medical History  Diagnosis Date  . Renal disorder   . ESRD (end stage renal disease)   . Hypertension   . Multiple myeloma 2009  . Thyroid disease   . Hyperparathyroidism   . Thrombocytopenia   . Anemia   . Shortness of breath   . Pneumonia   . GERD (gastroesophageal reflux disease)   . 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    ALLERGIES:  has No Known Allergies.  MEDICATIONS: has a current medication list which includes the following prescription(s): acetaminophen-codeine, acyclovir, amlodipine, aspirin ec, cyproheptadine, dexamethasone, furosemide, hydralazine, hydrocodone-homatropine, isosorbide mononitrate, lenalidomide, lorazepam, metolazone, multiple vitamins-iron, ondansetron, prochlorperazine, and sevelamer carbonate, and the following Facility-Administered Medications: sodium chloride.  SURGICAL HISTORY:  Past Surgical History  Procedure Laterality Date  . Abdominal  hysterectomy      Pt. denies    REVIEW OF SYSTEMS:   Constitutional: Denies fevers, chills or abnormal weight loss. States that they pulled too much fluid off her during dialysis. Eyes: Denies blurriness of vision Ears, nose, mouth, throat, and face: Denies mucositis or sore throat Respiratory: Denies cough, dyspnea or wheezes Cardiovascular: Denies palpitation, chest discomfort or lower extremity swelling Gastrointestinal:  Denies nausea, heartburn or change in bowel habits Skin: Denies abnormal skin rashes Lymphatics: Denies new lymphadenopathy or easy bruising Neurological:Denies numbness, tingling or new weaknesses Behavioral/Psych: Mood is stable, no new changes  All other systems were reviewed with the patient and are negative.  PHYSICAL EXAMINATION:    ECOG PEFORMANCE STATUS: 1-2  GENERAL:alert, no distress and comfortable; well developed and well nourished. Anxious SKIN: skin color, texture, turgor are normal, no rashes or significant lesions EYES: normal, Conjunctiva are pink and non-injected, sclera clear OROPHARYNX:no exudate, no erythema and lips, buccal mucosa, and tongue normal  NECK: supple, thyroid normal size, non-tender, without nodularity LYMPH:  no palpable lymphadenopathy in the cervical, axillary or supraclavicular LUNGS: clear to auscultation with normal breathing effort, no wheezes or rhonchi HEART: regular rate & rhythm and no murmurs and 1+  edema in ankles bilaterally; L AVF ABDOMEN:abdomen soft, non-tender and normal bowel sounds Musculoskeletal:no cyanosis of digits and no clubbing  NEURO: alert & oriented x 3 with fluent speech, no focal motor/sensory deficits   RADIOGRAPHIC STUDIES:    Dg Bone Survey Met  03/12/2014   CLINICAL DATA:  Myeloma.  Right knee pain.  EXAM: METASTATIC BONE SURVEY  COMPARISON:  None.  FINDINGS: Multiple images are performed of the axial and appendicular skeleton.  Multiple small lytic lesions are identified within the  skull consistent with myeloma. A small lytic lesion is identified within the right scapula.  Degenerative changes are identified within the mid cervical spine. No suspicious lytic or blastic lesions are identified within the spine, or long bones. Surgical clips are identified in the left upper arm. Degenerative changes are seen in the knees bilaterally, right greater than left.  The heart is mildly enlarged. There are areas of atelectasis within the right lung. No focal consolidations or pulmonary edema.  IMPRESSION: 1. Small lytic lesions within the skull and right scapula. 2. Degenerative changes in the spine and knees, right greater than left. 3. Cardiomegaly without pulmonary edema.   Electronically Signed   By: Shon Hale M.D.   On: 03/12/2014 10:13     ASSESSMENT: [name] 73 y.o. female with a history of Multiple Myeloma and ESRD on HD currently on a modified regimen of Dexamethasone, Revlimid and Carfilzomib. Chemo held last few weeks due to neutropenia from chemo and Revlimid. She has transport issues and on T/T/S she goes for hemodialysis. She will start cycle 6 today on 08/23/2014 as counts have recovered. The platelets have already recovered and the white count is low normal. She will continue dexamethasone which has anti-myeloma properties as well.She will continue low dose Revlimid today.  PLAN:   1. IgG Lambda MM, rapidly progressive.  --Her Bone marrow is packed with 85% plasma cells likely explaining her refractory anemia and pancytopenia. Her protein was 14.7 down to 12.5 to 10.3 to 3.9 grams  since starting chemotherapy. Her case has been difficult due to a history of non-compliance. She stated that she stopped her recent treatment in [place] due to being hospitalized. Per records from her primary oncologist's office, it was likely due to her kidney failure due to progression of her multiple myeloma. Her last treatment is as noted above. Considering she received only one cycle of  carfilzomib plus dexamethasone, we started her back on this regiment based on Nicole Kindred et. Al, NEJM, 2015).   *Carfilzomib (15 mg/m2) on days 1, 3, 8, 10, 15, 17  *Lenalidomide $RemoveBefore'5mg'[id number]$  daily (please take follow H/D on Tue/th/Sa). --dose reduced by 50% due to delayed count recovery. Revlimid is now being held as of today 06/14/14 *Dexamethasone 12 mg weekly  We presented the treatment indications, benefits and side-effects including but not limited to myelosuprresion which can result in life-threatening infections and anemia and low plts, and nausea or vomiting or rashes. Continue acyclovir for antiviral prophylaxis.  She understood the indications, risks and benefits and chose to proceed. She started therapy on 03/29/2014. Marland Kitchen Her IgG level on 05/24/2014 was down to 4.8 grams (4820 mg) from 11.3 grams (11300 mg) in June 2015.  IgG on 07/23/2014 was 3620 trending down.  She is on cycle 6 week 1. She will get two dose of Carfilzomib this week and neupogen on Friday to complete this cycle and we will see her again next week on 08/30/14.  2. Pancytopenia with anemia likely multifactorial.  --Secondary  to #1. She cannot have transfusions on the days of her dialysis (Tues, Thurs and Sat). Her ANC is 2800. She was advised extensively on symptoms of Neutropenia.  Her hemoglobin is 10.0. Her plts are up to 158.  3. ESRDz.  --Likely secondary to #1. Continue hemodialysis on Tues, Thursday and Sat.   4. Psuedohyponatremia.  --Likely due to #1. Treat underlying disorder. Her sodium is stable at 135   5. History of Noncompliance.  -- Ongoing problem but we are trying to work with her and SW arranged for transport through end of December.  6. Lower extremity swelling. --.  May require additional fluid pulled off with h/d. Lasix prn.  7. Poor nutrition. --She has had dental pain with resolution which may in part explain some weight lost.  We will refer to nutrition with consideration for nutritional  supplementation with boost and/or ensure. She will be referred to oral surgeon. She is not on bisphosphonate currently.  8. Hx of Hyperkalemia. --HD Tomorrow k+ 4.0  9. Follow up.  --Patient will follow up in 1 weeks on 08/30/2014 to restart cycle 6 week 2. I wished her good luck. She needs weekly visits during Chemotherapy and will be switching to Dr Alvy Bimler next month.  All questions were answered. The patient knows to call the clinic with any problems, questions or concerns. We can certainly see the patient much sooner if necessary.  I spent 20 minutes counseling the patient face to face. The total time spent in the appointment was 25 minutes.    Bernadene Bell, MD Medical Hematologist/Oncologist [place] Pager: [phone] Office No: [phone]

## 2014-08-25 ENCOUNTER — Ambulatory Visit (HOSPITAL_BASED_OUTPATIENT_CLINIC_OR_DEPARTMENT_OTHER): Payer: Medicare Other

## 2014-08-25 DIAGNOSIS — Z5112 Encounter for antineoplastic immunotherapy: Secondary | ICD-10-CM

## 2014-08-25 DIAGNOSIS — C9000 Multiple myeloma not having achieved remission: Secondary | ICD-10-CM

## 2014-08-25 LAB — SPEP & IFE WITH QIG
ALBUMIN ELP: 50.3 % — AB (ref 55.8–66.1)
Alpha-1-Globulin: 4.1 % (ref 2.9–4.9)
Alpha-2-Globulin: 7 % — ABNORMAL LOW (ref 7.1–11.8)
BETA 2: 2.2 % — AB (ref 3.2–6.5)
Beta Globulin: 3.8 % — ABNORMAL LOW (ref 4.7–7.2)
GAMMA GLOBULIN: 32.6 % — AB (ref 11.1–18.8)
IgA: <6 mg/dL — ABNORMAL LOW (ref 69–380)
IgG (Immunoglobin G), Serum: 2740 mg/dL — ABNORMAL HIGH (ref 690–1700)
M-Spike, %: 2.26 g/dL
Total Protein, Serum Electrophoresis: 7.7 g/dL (ref 6.0–8.3)

## 2014-08-25 LAB — KAPPA/LAMBDA LIGHT CHAINS
Kappa free light chain: 0.32 mg/dL — ABNORMAL LOW (ref 0.33–1.94)
Kappa:Lambda Ratio: 0 — ABNORMAL LOW (ref 0.26–1.65)
Lambda Free Lght Chn: 431 mg/dL — ABNORMAL HIGH (ref 0.57–2.63)

## 2014-08-25 MED ORDER — SODIUM CHLORIDE 0.9 % IV SOLN
Freq: Once | INTRAVENOUS | Status: AC
  Administered 2014-08-25: 11:00:00 via INTRAVENOUS

## 2014-08-25 MED ORDER — CARFILZOMIB CHEMO INJECTION 60 MG
15.0000 mg/m2 | Freq: Once | INTRAVENOUS | Status: AC
  Administered 2014-08-25: 26 mg via INTRAVENOUS
  Filled 2014-08-25: qty 13

## 2014-08-25 MED ORDER — ONDANSETRON 8 MG/50ML IVPB (CHCC)
8.0000 mg | Freq: Once | INTRAVENOUS | Status: AC
  Administered 2014-08-25: 8 mg via INTRAVENOUS

## 2014-08-25 MED ORDER — DEXAMETHASONE SODIUM PHOSPHATE 10 MG/ML IJ SOLN
INTRAMUSCULAR | Status: AC
  Filled 2014-08-25: qty 1

## 2014-08-25 MED ORDER — DEXAMETHASONE SODIUM PHOSPHATE 10 MG/ML IJ SOLN
10.0000 mg | Freq: Once | INTRAMUSCULAR | Status: AC
  Administered 2014-08-25: 10 mg via INTRAVENOUS

## 2014-08-25 MED ORDER — ONDANSETRON 8 MG/NS 50 ML IVPB
INTRAVENOUS | Status: AC
  Filled 2014-08-25: qty 8

## 2014-08-27 ENCOUNTER — Ambulatory Visit (HOSPITAL_BASED_OUTPATIENT_CLINIC_OR_DEPARTMENT_OTHER): Payer: Medicare Other

## 2014-08-27 DIAGNOSIS — D61818 Other pancytopenia: Secondary | ICD-10-CM

## 2014-08-27 DIAGNOSIS — Z5189 Encounter for other specified aftercare: Secondary | ICD-10-CM

## 2014-08-27 MED ORDER — TBO-FILGRASTIM 480 MCG/0.8ML ~~LOC~~ SOSY
480.0000 ug | PREFILLED_SYRINGE | Freq: Once | SUBCUTANEOUS | Status: AC
  Administered 2014-08-27: 480 ug via SUBCUTANEOUS
  Filled 2014-08-27: qty 0.8

## 2014-08-27 MED ORDER — FILGRASTIM 480 MCG/0.8ML IJ SOSY: 480.0000 ug | PREFILLED_SYRINGE | Freq: Once | INTRAMUSCULAR | Status: DC

## 2014-08-27 NOTE — Patient Instructions (Signed)
Filgrastim, G-CSF injection  What is this medicine?  FILGRASTIM, G-CSF (fil GRA stim) is a granulocyte colony-stimulating factor that stimulates the growth of neutrophils, a type of white blood cell (WBC) important in the body's fight against infection. It is used to reduce the incidence of fever and infection in patients with certain types of cancer who are receiving chemotherapy that affects the bone marrow, to stimulate blood cell production for removal of WBCs from the body prior to a bone marrow transplantation, to reduce the incidence of fever and infection in patients who have severe chronic neutropenia, and to improve survival outcomes following high-dose radiation exposure that is toxic to the bone marrow.  This medicine may be used for other purposes; ask your health care provider or pharmacist if you have questions.  COMMON BRAND NAME(S): Neupogen  What should I tell my health care provider before I take this medicine?  They need to know if you have any of these conditions:  -latex allergy  -ongoing radiation therapy  -sickle cell disease  -an unusual or allergic reaction to filgrastim, pegfilgrastim, other medicines, foods, dyes, or preservatives  -pregnant or trying to get pregnant  -breast-feeding  How should I use this medicine?  This medicine is for injection under the skin. If you get this medicine at home, you will be taught how to prepare and give this medicine. Refer to the Instructions for Use that come with your medication packaging. Use exactly as directed. Take your medicine at regular intervals. Do not take your medicine more often than directed.  It is important that you put your used needles and syringes in a special sharps container. Do not put them in a trash can. If you do not have a sharps container, call your pharmacist or healthcare provider to get one.  Talk to your pediatrician regarding the use of this medicine in children. While this drug may be prescribed for children as young  as 7 months for selected conditions, precautions do apply.  Overdosage: If you think you have taken too much of this medicine contact a poison control center or emergency room at once.  NOTE: This medicine is only for you. Do not share this medicine with others.  What if I miss a dose?  It is important not to miss your dose. Call your doctor or health care professional if you miss a dose.  What may interact with this medicine?  This medicine may interact with the following medications:  -medicines that may cause a release of neutrophils, such as lithium  This list may not describe all possible interactions. Give your health care provider a list of all the medicines, herbs, non-prescription drugs, or dietary supplements you use. Also tell them if you smoke, drink alcohol, or use illegal drugs. Some items may interact with your medicine.  What should I watch for while using this medicine?  You may need blood work done while you are taking this medicine.  What side effects may I notice from receiving this medicine?  Side effects that you should report to your doctor or health care professional as soon as possible:  -allergic reactions like skin rash, itching or hives, swelling of the face, lips, or tongue  -dizziness or feeling faint  -fever  -pain, redness, or irritation at site where injected  -pinpoint red spots on the skin  -shortness of breath or breathing problems  -stomach or side pain, or pain at the shoulder  -swelling  -tiredness  -trouble passing urine  -unusual   bleeding or bruising  Side effects that usually do not require medical attention (report to your doctor or health care professional if they continue or are bothersome):  -bone pain  -headache  -muscle pain  This list may not describe all possible side effects. Call your doctor for medical advice about side effects. You may report side effects to FDA at 1-800-FDA-1088.  Where should I keep my medicine?  Keep out of the reach of children.  Store in a  refrigerator between 2 and 8 degrees C (36 and 46 degrees F). Do not freeze. Keep in carton to protect from light. Throw away this medicine if vials or syringes are left out of the refrigerator for more than 24 hours. Throw away any unused medicine after the expiration date.  NOTE: This sheet is a summary. It may not cover all possible information. If you have questions about this medicine, talk to your doctor, pharmacist, or health care provider.   2015, Elsevier/Gold Standard. (2014-01-01 17:00:01)

## 2014-08-30 ENCOUNTER — Other Ambulatory Visit (HOSPITAL_BASED_OUTPATIENT_CLINIC_OR_DEPARTMENT_OTHER): Payer: Medicare Other

## 2014-08-30 ENCOUNTER — Ambulatory Visit: Payer: Self-pay

## 2014-08-30 ENCOUNTER — Encounter: Payer: Self-pay | Admitting: Hematology and Oncology

## 2014-08-30 ENCOUNTER — Telehealth: Payer: Self-pay | Admitting: Hematology and Oncology

## 2014-08-30 ENCOUNTER — Ambulatory Visit (HOSPITAL_BASED_OUTPATIENT_CLINIC_OR_DEPARTMENT_OTHER): Payer: Medicare Other

## 2014-08-30 ENCOUNTER — Other Ambulatory Visit: Payer: Self-pay | Admitting: Hematology and Oncology

## 2014-08-30 ENCOUNTER — Ambulatory Visit (HOSPITAL_BASED_OUTPATIENT_CLINIC_OR_DEPARTMENT_OTHER): Payer: Medicare Other | Admitting: Hematology and Oncology

## 2014-08-30 VITALS — BP 135/71 | HR 46 | Temp 98.1°F | Resp 18 | Ht 67.0 in | Wt 145.4 lb

## 2014-08-30 DIAGNOSIS — N186 End stage renal disease: Secondary | ICD-10-CM

## 2014-08-30 DIAGNOSIS — C9000 Multiple myeloma not having achieved remission: Secondary | ICD-10-CM

## 2014-08-30 DIAGNOSIS — D630 Anemia in neoplastic disease: Secondary | ICD-10-CM

## 2014-08-30 DIAGNOSIS — Z5112 Encounter for antineoplastic immunotherapy: Secondary | ICD-10-CM

## 2014-08-30 LAB — COMPREHENSIVE METABOLIC PANEL (CC13)
ALK PHOS: 42 U/L (ref 40–150)
ALT: 12 U/L (ref 0–55)
AST: 14 U/L (ref 5–34)
Albumin: 3 g/dL — ABNORMAL LOW (ref 3.5–5.0)
Anion Gap: 14 mEq/L — ABNORMAL HIGH (ref 3–11)
BILIRUBIN TOTAL: 0.53 mg/dL (ref 0.20–1.20)
BUN: 38.9 mg/dL — ABNORMAL HIGH (ref 7.0–26.0)
CO2: 26 mEq/L (ref 22–29)
CREATININE: 9.4 mg/dL — AB (ref 0.6–1.1)
Calcium: 8.8 mg/dL (ref 8.4–10.4)
Chloride: 98 mEq/L (ref 98–109)
GLUCOSE: 85 mg/dL (ref 70–140)
Potassium: 3.9 mEq/L (ref 3.5–5.1)
Sodium: 137 mEq/L (ref 136–145)
Total Protein: 7 g/dL (ref 6.4–8.3)

## 2014-08-30 LAB — CBC WITH DIFFERENTIAL/PLATELET
BASO%: 0.2 % (ref 0.0–2.0)
Basophils Absolute: 0 10*3/uL (ref 0.0–0.1)
EOS ABS: 0.2 10*3/uL (ref 0.0–0.5)
EOS%: 4.5 % (ref 0.0–7.0)
HCT: 33.4 % — ABNORMAL LOW (ref 34.8–46.6)
HGB: 10.5 g/dL — ABNORMAL LOW (ref 11.6–15.9)
LYMPH%: 25.2 % (ref 14.0–49.7)
MCH: 32.1 pg (ref 25.1–34.0)
MCHC: 31.4 g/dL — ABNORMAL LOW (ref 31.5–36.0)
MCV: 102.1 fL — AB (ref 79.5–101.0)
MONO#: 0.6 10*3/uL (ref 0.1–0.9)
MONO%: 12.6 % (ref 0.0–14.0)
NEUT%: 57.5 % (ref 38.4–76.8)
NEUTROS ABS: 2.8 10*3/uL (ref 1.5–6.5)
NRBC: 0 % (ref 0–0)
PLATELETS: 133 10*3/uL — AB (ref 145–400)
RBC: 3.27 10*6/uL — AB (ref 3.70–5.45)
RDW: 16.4 % — ABNORMAL HIGH (ref 11.2–14.5)
WBC: 4.9 10*3/uL (ref 3.9–10.3)
lymph#: 1.2 10*3/uL (ref 0.9–3.3)

## 2014-08-30 MED ORDER — SODIUM CHLORIDE 0.9 % IV SOLN
Freq: Once | INTRAVENOUS | Status: AC
  Administered 2014-08-30: 14:00:00 via INTRAVENOUS

## 2014-08-30 MED ORDER — CARFILZOMIB CHEMO INJECTION 60 MG
15.0000 mg/m2 | Freq: Once | INTRAVENOUS | Status: AC
  Administered 2014-08-30: 26 mg via INTRAVENOUS
  Filled 2014-08-30: qty 13

## 2014-08-30 MED ORDER — DEXAMETHASONE SODIUM PHOSPHATE 10 MG/ML IJ SOLN
10.0000 mg | Freq: Once | INTRAMUSCULAR | Status: AC
  Administered 2014-08-30: 10 mg via INTRAVENOUS

## 2014-08-30 MED ORDER — ONDANSETRON 8 MG/NS 50 ML IVPB
INTRAVENOUS | Status: AC
  Filled 2014-08-30: qty 8

## 2014-08-30 MED ORDER — ONDANSETRON 8 MG/50ML IVPB (CHCC)
8.0000 mg | Freq: Once | INTRAVENOUS | Status: AC
  Administered 2014-08-30: 8 mg via INTRAVENOUS

## 2014-08-30 MED ORDER — DEXAMETHASONE SODIUM PHOSPHATE 10 MG/ML IJ SOLN
INTRAMUSCULAR | Status: AC
  Filled 2014-08-30: qty 1

## 2014-08-30 NOTE — Telephone Encounter (Signed)
gv adn printed appt sched and avs for pt for DEC adn Jan ...s.w. Dr. Cletus Gash about 12.25 and 1.1 inj...she will advised when to sched...pt has dialysis on Tue thur and Sat

## 2014-08-30 NOTE — Assessment & Plan Note (Signed)
She is not symptomatic and does not require transfusion or erythropoietin stimulating agent right now.

## 2014-08-30 NOTE — Assessment & Plan Note (Signed)
She has extensive treatment protocol modification to accommodate for the need for hemodialysis and side effects. I will try to accommodate to her needs and we shall continue modified dose chemotherapy with Kyprolis, Revlimid and dexamethasone along with G-CSF support

## 2014-08-30 NOTE — Assessment & Plan Note (Signed)
She will continue hemodialysis 3 times a week through the fistula on Tuesday, Thursday and Saturday.

## 2014-08-30 NOTE — Progress Notes (Signed)
[facility] progress notes  Patient Care Team: No Pcp Per Patient as PCP - General (General Practice)  CHIEF COMPLAINTS/PURPOSE OF VISIT:  IgG multiple myeloma  HISTORY OF PRESENTING ILLNESS:  [name] 73 y.o. female was transferred to my care after her prior physician has left.  I reviewed the patient's records extensive and collaborated the history with the patient. Summary of her history is as follows:   Multiple myeloma   03/30/2008 Initial Diagnosis Multiple myeloma   03/30/2008 - 05/01/2012 Chemotherapy Revlimid/Dex then Velcade (started on 07/13/2011 ending on 09/14/2011) without response.  Managed by Dr. Chauncey Fischer of [place], [place] [address]   07/24/2010 -  Chemotherapy Received zometa  intermittently over one year.  Creatinine was 1.0 on 07/24/2010. 1.7 on 08/10/2011.    05/01/2012 - 09/01/2012 Chemotherapy Starte Pomalidomide s/p 4 cycles. Referred to [facility] for consideration of ASCT.     10/08/2012 Bone Marrow Biopsy Normocellular marrow with residual/recurrently plasma cell myeloma, 60-70 % of overall marrow celluarity.  Flow: c/w plasma cell neoplasm, plasma cell infiltrate comprises 60-70 % of overall marrow cellularity; FISH: + for 1 q21/CKS1B gain; RB1 (13q14)   10/08/2012 Bone Marrow Biopsy Continued... Cytogenetics showed normal female karyotype.    10/29/2012 Tumor Marker High IgG (6019), high lamda free light chain (409.32) with low IgA, IgM and kappa free light chain.    04/21/2013 Imaging Skeletal survey.  No suspcious lytic or blastic lesions visualized.  Severe multilevel degenerative disc disease in the cervical spine.    04/29/2013 Imaging PeT. No discrete suspicious focus of FDG uptake identified.    05/20/2013 - 06/09/2013 Chemotherapy Started carfilxomib 39 mg on 08/25, 08/26, day 8 (06/02/13), day 9 on 06/03/2013 and on 06/09/2013.     12/03/2013 Treatment Plan Change Last office visit with Dr. Glenna Durand.  She decline further chemotherapy.    Her son passed away and she relocated to [place] to stay with her daughter [name].    03/11/2014 - 03/17/2014 Hospital Admission Admitted for low hemoglobin. Dr. Burney Gauze consulted.  SPEP,  UPEP and Bone marrow biopsy obtained.    IgG 11,300. M-spike 7.52, kappa lamda ratio 0 with lambda free light chains totaling 1260.    03/12/2014 Imaging Skeletal survey. 1. Small lytic lesions within the skull and right scapula. 2. Degenerative changes in the spine and knees, right greater than left. 3. Cardiomegaly without pulmonary edema.   03/17/2014 Bone Marrow Biopsy Hypercellular bone marrow tih extensive involvment by plasma cell neoplasm (Plasma cells 85%). FISH: Presence of 2 copies of the IGH/FGFR3, ATM, CCND1/IGH, and p53 probes.  Loss of 13q34 probes in 40% of cells. gain of chromose 12 and either loss of chorm   03/25/2014 -  Chemotherapy Planning Kyprolis 15 mg/m2 plus dexamethasone weekly plus revlimid (47m) daily on 06/29.   06/14/2014 -  Chemotherapy Patient's last dose of Kyprolis was on 05/26/14. Since then chemo held due to neutropenia despite GCSF support. Revlimid also on hold as of 06/14/14   06/28/2014 -  Chemotherapy New cycle of Kyprolis starts today.   07/07/2014 Tumor Marker IgG 3960    07/23/2014 Tumor Marker IgG 3620   She is seen today prior to chemotherapy. She has no symptoms of side effects from treatment. She is doing well with dialysis 3 times a week through the fistula. MEDICAL HISTORY:  Past Medical History  Diagnosis Date  . Renal disorder   . ESRD (end stage renal disease)   . Hypertension   . Multiple myeloma  2009  . Thyroid disease   . Hyperparathyroidism   . Thrombocytopenia   . Anemia   . Shortness of breath   . Pneumonia   . GERD (gastroesophageal reflux disease)   . Arthritis     rt knee is stiff    SURGICAL HISTORY: Past Surgical History  Procedure Laterality Date  . Abdominal hysterectomy      Pt. denies    SOCIAL HISTORY: History   Social  History  . Marital Status: Married    Spouse Name: N/A    Number of Children: 3  . Years of Education: N/A   Occupational History  . Retired      Pharmacist, hospital asst.   Social History Main Topics  . Smoking status: Never Smoker   . Smokeless tobacco: Never Used  . Alcohol Use: No  . Drug Use: No  . Sexual Activity: No   Other Topics Concern  . Not on file   Social History Narrative   Patient recently moved to [place] from [place] in May 2015.   [name] was born in [place], [place].   Patient has brother in Home area.    FAMILY HISTORY: Family History  Problem Relation Age of Onset  . Hypertension Mother   . Hypertension Father     ALLERGIES:  has No Known Allergies.  MEDICATIONS:  Current Outpatient Prescriptions  Medication Sig Dispense Refill  . acetaminophen-codeine (TYLENOL #3) 300-30 MG per tablet Take by mouth every 4 (four) hours as needed for moderate pain.    Marland Kitchen acyclovir (ZOVIRAX) 400 MG tablet Take 1 tablet (400 mg total) by mouth daily. 60 tablet 1  . amLODipine (NORVASC) 10 MG tablet Take 10 mg by mouth daily.     Marland Kitchen aspirin EC 81 MG tablet Take 81 mg by mouth daily.    . cyproheptadine (PERIACTIN) 4 MG tablet Take 4 mg by mouth 2 (two) times daily.    Marland Kitchen dexamethasone (DECADRON) 4 MG tablet Take 3 tablets (12 mg) on days 1,8,15, and 22 of chemotherapy. 24 tablet 2  . furosemide (LASIX) 20 MG tablet Take 1 tablet (20 mg total) by mouth daily. PRN for leg edema 60 tablet 2  . hydrALAZINE (APRESOLINE) 100 MG tablet Take 100 mg by mouth every 8 (eight) hours.    Marland Kitchen HYDROcodone-homatropine (HYCODAN) 5-1.5 MG/5ML syrup Take 5 mLs by mouth every 6 (six) hours as needed for cough. 120 mL 0  . isosorbide mononitrate (IMDUR) 60 MG 24 hr tablet Take 60 mg by mouth daily.    Marland Kitchen lenalidomide (REVLIMID) 2.5 MG capsule Take 1 capsule (2.5 mg total) by mouth daily. 28 capsule 0  . LORazepam (ATIVAN) 0.5 MG tablet Take 1 tablet (0.5 mg total) by mouth every 6 (six) hours as  needed (Nausea or vomiting). 30 tablet 0  . metolazone (ZAROXOLYN) 5 MG tablet Take 5 mg by mouth 2 (two) times daily.    . Multiple Vitamins-Iron (DAILY-VITE/IRON PO) Take 1 tablet by mouth daily.    . ondansetron (ZOFRAN) 8 MG tablet Take 1 tablet (8 mg total) by mouth 2 (two) times daily. Start the day after chemo for 2 days. Then as needed for nausea or vomiting. 30 tablet 1  . prochlorperazine (COMPAZINE) 10 MG tablet Take 1 tablet (10 mg total) by mouth every 6 (six) hours as needed (Nausea or vomiting). 30 tablet 1  . sevelamer carbonate (RENVELA) 800 MG tablet Take 800 mg by mouth 3 (three) times daily with meals.  No current facility-administered medications for this visit.   Facility-Administered Medications Ordered in Other Visits  Medication Dose Route Frequency Provider Last Rate Last Dose  . 0.9 %  sodium chloride infusion   Intravenous Once Aasim Marla Roe, MD        REVIEW OF SYSTEMS:   Constitutional: Denies fevers, chills or abnormal night sweats Eyes: Denies blurriness of vision, double vision or watery eyes Ears, nose, mouth, throat, and face: Denies mucositis or sore throat Respiratory: Denies cough, dyspnea or wheezes Cardiovascular: Denies palpitation, chest discomfort or lower extremity swelling Gastrointestinal:  Denies nausea, heartburn or change in bowel habits Skin: Denies abnormal skin rashes Lymphatics: Denies new lymphadenopathy or easy bruising Neurological:Denies numbness, tingling or new weaknesses Behavioral/Psych: Mood is stable, no new changes  All other systems were reviewed with the patient and are negative.  PHYSICAL EXAMINATION: ECOG PERFORMANCE STATUS: 1 - Symptomatic but completely ambulatory  Filed Vitals:   08/30/14 1150  BP: 135/71  Pulse: 46  Temp: 98.1 F (36.7 C)  Resp: 18   Filed Weights   08/30/14 1150  Weight: 145 lb 6.4 oz (65.953 kg)    GENERAL:alert, no distress and comfortable SKIN: skin color, texture, turgor  are normal, no rashes or significant lesions EYES: normal, conjunctiva are pink and non-injected, sclera clear OROPHARYNX:no exudate, normal lips, buccal mucosa, and tongue  NECK: supple, thyroid normal size, non-tender, without nodularity LYMPH:  no palpable lymphadenopathy in the cervical, axillary or inguinal LUNGS: clear to auscultation and percussion with normal breathing effort HEART: regular rate & rhythm and no murmurs without lower extremity edema ABDOMEN:abdomen soft, non-tender and normal bowel sounds Musculoskeletal:no cyanosis of digits and no clubbing  PSYCH: alert & oriented x 3 with fluent speech NEURO: no focal motor/sensory deficits  LABORATORY DATA:  I have reviewed the data as listed Lab Results  Component Value Date   WBC 4.9 08/30/2014   HGB 10.5* 08/30/2014   HCT 33.4* 08/30/2014   MCV 102.1* 08/30/2014   PLT 133* 08/30/2014    Recent Labs  03/16/14 0800 03/16/14 1353 03/18/14 0500  05/10/14 1439  08/09/14 1227 08/23/14 1032 08/30/14 1101  NA 127* 126* 125*  < > 134*  < > 133* 135* 137  K 3.8 3.8 3.8  < > 4.8  < > 4.9 4.0 3.9  CL 92* 93* 92*  --  93*  --   --   --   --   CO2 '25 24 25  ' < > 26  < > '22 23 26  ' GLUCOSE 76 82 79  < > 84  < > 88 91 85  BUN 36* 38* 31*  < > 40*  < > 74.0* 37.0* 38.9*  CREATININE 9.16* 9.58* 8.22*  < > 9.86*  < > 12.7* 8.9* 9.4*  CALCIUM 7.6* 7.5* 8.6  < > 9.0  < > 8.3* 9.2 8.8  GFRNONAA 4* 4* 4*  --   --   --   --   --   --   GFRAA 4* 4* 5*  --   --   --   --   --   --   PROT  --   --   --   < > 10.3*  < > 7.8 7.6 7.0  ALBUMIN 2.4* 2.3* 2.4*  < > 3.0*  < > 2.9* 3.1* 3.0*  AST  --   --   --   < > 17  < > '11 15 14  ' ALT  --   --   --   < >  7  < > '8 10 12  ' ALKPHOS  --   --   --   < > 50  < > 36* 44 42  BILITOT  --   --   --   < > 0.4  < > 0.39 0.51 0.53  < > = values in this interval not displayed.  ASSESSMENT & PLAN:  Multiple myeloma She has extensive treatment protocol modification to accommodate for the need for  hemodialysis and side effects. I will try to accommodate to her needs and we shall continue modified dose chemotherapy with Kyprolis, Revlimid and dexamethasone along with G-CSF support  ESRD (end stage renal disease) She will continue hemodialysis 3 times a week through the fistula on Tuesday, Thursday and Saturday.  Anemia in neoplastic disease She is not symptomatic and does not require transfusion or erythropoietin stimulating agent right now.   Orders Placed This Encounter  Procedures  . CBC with Differential    Standing Status: Standing     Number of Occurrences: 9     Standing Expiration Date: 08/31/2015  . Comprehensive metabolic panel    Standing Status: Standing     Number of Occurrences: 9     Standing Expiration Date: 08/31/2015  . SPEP & IFE with QIG    Standing Status: Future     Number of Occurrences:      Standing Expiration Date: 10/04/2015  . Kappa/lambda light chains    Standing Status: Future     Number of Occurrences:      Standing Expiration Date: 08/31/2015    All questions were answered. The patient knows to call the clinic with any problems, questions or concerns. I spent 30 minutes counseling the patient face to face. The total time spent in the appointment was 40 minutes and more than 50% was on counseling.     Clifton, Gasquet, MD 08/30/2014 8:16 PM

## 2014-09-01 ENCOUNTER — Ambulatory Visit (HOSPITAL_BASED_OUTPATIENT_CLINIC_OR_DEPARTMENT_OTHER): Payer: Medicare Other

## 2014-09-01 DIAGNOSIS — C9000 Multiple myeloma not having achieved remission: Secondary | ICD-10-CM

## 2014-09-01 DIAGNOSIS — Z5112 Encounter for antineoplastic immunotherapy: Secondary | ICD-10-CM

## 2014-09-01 MED ORDER — ONDANSETRON 8 MG/50ML IVPB (CHCC)
8.0000 mg | Freq: Once | INTRAVENOUS | Status: AC
  Administered 2014-09-01: 8 mg via INTRAVENOUS

## 2014-09-01 MED ORDER — DEXAMETHASONE SODIUM PHOSPHATE 10 MG/ML IJ SOLN
10.0000 mg | Freq: Once | INTRAMUSCULAR | Status: AC
  Administered 2014-09-01: 10 mg via INTRAVENOUS

## 2014-09-01 MED ORDER — DEXAMETHASONE SODIUM PHOSPHATE 10 MG/ML IJ SOLN
INTRAMUSCULAR | Status: AC
  Filled 2014-09-01: qty 1

## 2014-09-01 MED ORDER — DEXTROSE 5 % IV SOLN
15.0000 mg/m2 | Freq: Once | INTRAVENOUS | Status: AC
  Administered 2014-09-01: 26 mg via INTRAVENOUS
  Filled 2014-09-01: qty 13

## 2014-09-01 MED ORDER — ONDANSETRON 8 MG/NS 50 ML IVPB
INTRAVENOUS | Status: AC
Start: 2014-09-01 — End: 2014-09-01
  Filled 2014-09-01: qty 8

## 2014-09-01 MED ORDER — SODIUM CHLORIDE 0.9 % IV SOLN
Freq: Once | INTRAVENOUS | Status: AC
  Administered 2014-09-01: 11:00:00 via INTRAVENOUS

## 2014-09-01 NOTE — Patient Instructions (Signed)
[facility] Discharge Instructions for Patients Receiving Chemotherapy  Today you received the following chemotherapy agents kyprolis  To help prevent nausea and vomiting after your treatment, we encourage you to take your nausea medication as directed   If you develop nausea and vomiting that is not controlled by your nausea medication, call the clinic.   BELOW ARE SYMPTOMS THAT SHOULD BE REPORTED IMMEDIATELY:  *FEVER GREATER THAN 100.5 F  *CHILLS WITH OR WITHOUT FEVER  NAUSEA AND VOMITING THAT IS NOT CONTROLLED WITH YOUR NAUSEA MEDICATION  *UNUSUAL SHORTNESS OF BREATH  *UNUSUAL BRUISING OR BLEEDING  TENDERNESS IN MOUTH AND THROAT WITH OR WITHOUT PRESENCE OF ULCERS  *URINARY PROBLEMS  *BOWEL PROBLEMS  UNUSUAL RASH Items with * indicate a potential emergency and should be followed up as soon as possible.  Feel free to call the clinic you have any questions or concerns. The clinic phone number is [phone].  

## 2014-09-02 ENCOUNTER — Other Ambulatory Visit: Payer: Self-pay | Admitting: *Deleted

## 2014-09-02 NOTE — Telephone Encounter (Signed)
THIS REFILL REQUEST FOR REVLIMID WAS PLACED ON DR.GORSUCH'S DESK. 

## 2014-09-03 ENCOUNTER — Other Ambulatory Visit: Payer: Self-pay | Admitting: Medical Oncology

## 2014-09-03 ENCOUNTER — Ambulatory Visit (HOSPITAL_BASED_OUTPATIENT_CLINIC_OR_DEPARTMENT_OTHER): Payer: Medicare Other

## 2014-09-03 DIAGNOSIS — C9000 Multiple myeloma not having achieved remission: Secondary | ICD-10-CM

## 2014-09-03 DIAGNOSIS — D61818 Other pancytopenia: Secondary | ICD-10-CM

## 2014-09-03 DIAGNOSIS — Z5189 Encounter for other specified aftercare: Secondary | ICD-10-CM

## 2014-09-03 MED ORDER — FILGRASTIM 480 MCG/0.8ML IJ SOSY: 480.0000 ug | PREFILLED_SYRINGE | Freq: Once | INTRAMUSCULAR | Status: DC

## 2014-09-03 MED ORDER — TBO-FILGRASTIM 480 MCG/0.8ML ~~LOC~~ SOSY
480.0000 ug | PREFILLED_SYRINGE | Freq: Once | SUBCUTANEOUS | Status: AC
  Administered 2014-09-03: 480 ug via SUBCUTANEOUS
  Filled 2014-09-03: qty 0.8

## 2014-09-03 NOTE — Patient Instructions (Signed)
Filgrastim, G-CSF injection  What is this medicine?  FILGRASTIM, G-CSF (fil GRA stim) is a granulocyte colony-stimulating factor that stimulates the growth of neutrophils, a type of white blood cell (WBC) important in the body's fight against infection. It is used to reduce the incidence of fever and infection in patients with certain types of cancer who are receiving chemotherapy that affects the bone marrow, to stimulate blood cell production for removal of WBCs from the body prior to a bone marrow transplantation, to reduce the incidence of fever and infection in patients who have severe chronic neutropenia, and to improve survival outcomes following high-dose radiation exposure that is toxic to the bone marrow.  This medicine may be used for other purposes; ask your health care provider or pharmacist if you have questions.  COMMON BRAND NAME(S): Neupogen  What should I tell my health care provider before I take this medicine?  They need to know if you have any of these conditions:  -latex allergy  -ongoing radiation therapy  -sickle cell disease  -an unusual or allergic reaction to filgrastim, pegfilgrastim, other medicines, foods, dyes, or preservatives  -pregnant or trying to get pregnant  -breast-feeding  How should I use this medicine?  This medicine is for injection under the skin. If you get this medicine at home, you will be taught how to prepare and give this medicine. Refer to the Instructions for Use that come with your medication packaging. Use exactly as directed. Take your medicine at regular intervals. Do not take your medicine more often than directed.  It is important that you put your used needles and syringes in a special sharps container. Do not put them in a trash can. If you do not have a sharps container, call your pharmacist or healthcare provider to get one.  Talk to your pediatrician regarding the use of this medicine in children. While this drug may be prescribed for children as young  as 7 months for selected conditions, precautions do apply.  Overdosage: If you think you have taken too much of this medicine contact a poison control center or emergency room at once.  NOTE: This medicine is only for you. Do not share this medicine with others.  What if I miss a dose?  It is important not to miss your dose. Call your doctor or health care professional if you miss a dose.  What may interact with this medicine?  This medicine may interact with the following medications:  -medicines that may cause a release of neutrophils, such as lithium  This list may not describe all possible interactions. Give your health care provider a list of all the medicines, herbs, non-prescription drugs, or dietary supplements you use. Also tell them if you smoke, drink alcohol, or use illegal drugs. Some items may interact with your medicine.  What should I watch for while using this medicine?  You may need blood work done while you are taking this medicine.  What side effects may I notice from receiving this medicine?  Side effects that you should report to your doctor or health care professional as soon as possible:  -allergic reactions like skin rash, itching or hives, swelling of the face, lips, or tongue  -dizziness or feeling faint  -fever  -pain, redness, or irritation at site where injected  -pinpoint red spots on the skin  -shortness of breath or breathing problems  -stomach or side pain, or pain at the shoulder  -swelling  -tiredness  -trouble passing urine  -unusual   bleeding or bruising  Side effects that usually do not require medical attention (report to your doctor or health care professional if they continue or are bothersome):  -bone pain  -headache  -muscle pain  This list may not describe all possible side effects. Call your doctor for medical advice about side effects. You may report side effects to FDA at 1-800-FDA-1088.  Where should I keep my medicine?  Keep out of the reach of children.  Store in a  refrigerator between 2 and 8 degrees C (36 and 46 degrees F). Do not freeze. Keep in carton to protect from light. Throw away this medicine if vials or syringes are left out of the refrigerator for more than 24 hours. Throw away any unused medicine after the expiration date.  NOTE: This sheet is a summary. It may not cover all possible information. If you have questions about this medicine, talk to your doctor, pharmacist, or health care provider.   2015, Elsevier/Gold Standard. (2014-01-01 17:00:01)

## 2014-09-06 ENCOUNTER — Other Ambulatory Visit (HOSPITAL_BASED_OUTPATIENT_CLINIC_OR_DEPARTMENT_OTHER): Payer: Medicare Other

## 2014-09-06 ENCOUNTER — Other Ambulatory Visit: Payer: Self-pay | Admitting: *Deleted

## 2014-09-06 ENCOUNTER — Ambulatory Visit: Payer: Self-pay

## 2014-09-06 ENCOUNTER — Ambulatory Visit (HOSPITAL_BASED_OUTPATIENT_CLINIC_OR_DEPARTMENT_OTHER): Payer: Medicare Other

## 2014-09-06 ENCOUNTER — Encounter: Payer: Self-pay | Admitting: *Deleted

## 2014-09-06 DIAGNOSIS — N186 End stage renal disease: Secondary | ICD-10-CM

## 2014-09-06 DIAGNOSIS — C9000 Multiple myeloma not having achieved remission: Secondary | ICD-10-CM

## 2014-09-06 DIAGNOSIS — Z5112 Encounter for antineoplastic immunotherapy: Secondary | ICD-10-CM

## 2014-09-06 LAB — CBC WITH DIFFERENTIAL/PLATELET
BASO%: 0.1 % (ref 0.0–2.0)
BASOS ABS: 0 10*3/uL (ref 0.0–0.1)
EOS%: 1 % (ref 0.0–7.0)
Eosinophils Absolute: 0.1 10*3/uL (ref 0.0–0.5)
HCT: 36.1 % (ref 34.8–46.6)
HGB: 11.1 g/dL — ABNORMAL LOW (ref 11.6–15.9)
LYMPH%: 16.3 % (ref 14.0–49.7)
MCH: 32 pg (ref 25.1–34.0)
MCHC: 30.7 g/dL — AB (ref 31.5–36.0)
MCV: 104 fL — ABNORMAL HIGH (ref 79.5–101.0)
MONO#: 0.4 10*3/uL (ref 0.1–0.9)
MONO%: 5.8 % (ref 0.0–14.0)
NEUT#: 5.1 10*3/uL (ref 1.5–6.5)
NEUT%: 76.8 % (ref 38.4–76.8)
PLATELETS: 131 10*3/uL — AB (ref 145–400)
RBC: 3.47 10*6/uL — ABNORMAL LOW (ref 3.70–5.45)
RDW: 16.8 % — ABNORMAL HIGH (ref 11.2–14.5)
WBC: 6.7 10*3/uL (ref 3.9–10.3)
lymph#: 1.1 10*3/uL (ref 0.9–3.3)

## 2014-09-06 LAB — COMPREHENSIVE METABOLIC PANEL (CC13)
ALT: 19 U/L (ref 0–55)
ANION GAP: 14 meq/L — AB (ref 3–11)
AST: 16 U/L (ref 5–34)
Albumin: 3.1 g/dL — ABNORMAL LOW (ref 3.5–5.0)
Alkaline Phosphatase: 44 U/L (ref 40–150)
BILIRUBIN TOTAL: 0.47 mg/dL (ref 0.20–1.20)
BUN: 34.5 mg/dL — AB (ref 7.0–26.0)
CALCIUM: 9.1 mg/dL (ref 8.4–10.4)
CHLORIDE: 97 meq/L — AB (ref 98–109)
CO2: 27 meq/L (ref 22–29)
CREATININE: 7.8 mg/dL — AB (ref 0.6–1.1)
EGFR: 5 mL/min/{1.73_m2} — ABNORMAL LOW (ref >90)
Glucose: 84 mg/dl (ref 70–140)
Potassium: 4.1 mEq/L (ref 3.5–5.1)
Sodium: 137 mEq/L (ref 136–145)
Total Protein: 7.1 g/dL (ref 6.4–8.3)

## 2014-09-06 MED ORDER — DEXAMETHASONE SODIUM PHOSPHATE 10 MG/ML IJ SOLN
10.0000 mg | Freq: Once | INTRAMUSCULAR | Status: AC
  Administered 2014-09-06: 10 mg via INTRAVENOUS

## 2014-09-06 MED ORDER — ONDANSETRON 8 MG/50ML IVPB (CHCC)
8.0000 mg | Freq: Once | INTRAVENOUS | Status: AC
  Administered 2014-09-06: 8 mg via INTRAVENOUS

## 2014-09-06 MED ORDER — LENALIDOMIDE 2.5 MG PO CAPS: 2.5000 mg | ORAL_CAPSULE | Freq: Every day | ORAL | Status: DC

## 2014-09-06 MED ORDER — DEXAMETHASONE SODIUM PHOSPHATE 10 MG/ML IJ SOLN
INTRAMUSCULAR | Status: AC
  Filled 2014-09-06: qty 1

## 2014-09-06 MED ORDER — ONDANSETRON 8 MG/NS 50 ML IVPB
INTRAVENOUS | Status: AC
  Filled 2014-09-06: qty 8

## 2014-09-06 MED ORDER — SODIUM CHLORIDE 0.9 % IV SOLN
Freq: Once | INTRAVENOUS | Status: AC
  Administered 2014-09-06: 11:00:00 via INTRAVENOUS

## 2014-09-06 MED ORDER — DEXTROSE 5 % IV SOLN
15.0000 mg/m2 | Freq: Once | INTRAVENOUS | Status: AC
  Administered 2014-09-06: 26 mg via INTRAVENOUS
  Filled 2014-09-06: qty 13

## 2014-09-06 NOTE — Patient Instructions (Signed)
[facility] Discharge Instructions for Patients Receiving Chemotherapy  Today you received the following chemotherapy agents kyprolis  To help prevent nausea and vomiting after your treatment, we encourage you to take your nausea medication as directed   If you develop nausea and vomiting that is not controlled by your nausea medication, call the clinic.   BELOW ARE SYMPTOMS THAT SHOULD BE REPORTED IMMEDIATELY:  *FEVER GREATER THAN 100.5 F  *CHILLS WITH OR WITHOUT FEVER  NAUSEA AND VOMITING THAT IS NOT CONTROLLED WITH YOUR NAUSEA MEDICATION  *UNUSUAL SHORTNESS OF BREATH  *UNUSUAL BRUISING OR BLEEDING  TENDERNESS IN MOUTH AND THROAT WITH OR WITHOUT PRESENCE OF ULCERS  *URINARY PROBLEMS  *BOWEL PROBLEMS  UNUSUAL RASH Items with * indicate a potential emergency and should be followed up as soon as possible.  Feel free to call the clinic you have any questions or concerns. The clinic phone number is [phone].  

## 2014-09-06 NOTE — Progress Notes (Signed)
[place] [facility]  Ms. Supernaw shared she has been very pleased with the [facility] program.  CSW contacted [place] and requested drivers for the additional dates below:   S 09/20/2014 Mon 12:00 P 15 LAB MO [1477] [facility] LAB 1 [id number] [facility] [id number]      S 09/20/2014 Mon 12:30 P 30 EST PT 30 [965] [place], [place] [id number] [facility] [id number] d.t. per pof     S 09/20/2014 Mon  1:00 P 90 INFUSION 1H 53M [1466] [facility] A2 [id number] [facility] [id number]      S 09/22/2014 Wed 11:00 A 90 INFUSION 1H 53M [1466] [facility] G24 [id number] [facility] [id number]      S 09/27/2014 Mon 11:00 A 15 LAB MO [1477] [facility] LAB 1 [id number] [facility] [id number]      S 09/27/2014 Mon 11:45 A 90 INFUSION 1H 53M [1466] [facility] I26 DNS [id number] [facility] [id number]      S 09/29/2014 Wed 11:00 A 90 INFUSION 1H 53M [1466] [facility] PROCEDURE 2 [id number] [facility] [id number]      S 10/04/2014 Mon 11:00 A 15 LAB MO [1477] [facility] LAB 1 [id number] [facility] [id number]      S 10/04/2014 Mon 11:45 A 90 INFUSION 1H 53M [1466] [facility] D13 [id number] [facility] [id number]      S 10/06/2014 Wed 11:00 A 90 INFUSION 1H 53M [1466] [facility] G24 [id number] [facility] [id number]       10/08/2014 Fri 10:30 A 15 INJECTION [1081] [facility] INJ NURSE [id number] [facility] [id number]      Polo Riley, MSW, LCSW, OSW-C Clinical Social Worker [place] [phone]

## 2014-09-07 NOTE — Telephone Encounter (Signed)
RECEIVED A FAX FROM [facility] CONCERNING A CONFIRMATION OF FACSIMILE RECEIPT FOR PT. REFERRAL. 

## 2014-09-08 ENCOUNTER — Ambulatory Visit (HOSPITAL_BASED_OUTPATIENT_CLINIC_OR_DEPARTMENT_OTHER): Payer: Medicare Other

## 2014-09-08 DIAGNOSIS — Z5112 Encounter for antineoplastic immunotherapy: Secondary | ICD-10-CM

## 2014-09-08 DIAGNOSIS — C9000 Multiple myeloma not having achieved remission: Secondary | ICD-10-CM

## 2014-09-08 MED ORDER — SODIUM CHLORIDE 0.9 % IV SOLN
Freq: Once | INTRAVENOUS | Status: AC
  Administered 2014-09-08: 11:00:00 via INTRAVENOUS

## 2014-09-08 MED ORDER — DEXAMETHASONE SODIUM PHOSPHATE 10 MG/ML IJ SOLN
INTRAMUSCULAR | Status: AC
  Filled 2014-09-08: qty 1

## 2014-09-08 MED ORDER — ONDANSETRON 8 MG/50ML IVPB (CHCC)
8.0000 mg | Freq: Once | INTRAVENOUS | Status: AC
  Administered 2014-09-08: 8 mg via INTRAVENOUS

## 2014-09-08 MED ORDER — DEXTROSE 5 % IV SOLN
15.0000 mg/m2 | Freq: Once | INTRAVENOUS | Status: AC
  Administered 2014-09-08: 26 mg via INTRAVENOUS
  Filled 2014-09-08: qty 13

## 2014-09-08 MED ORDER — ONDANSETRON 8 MG/NS 50 ML IVPB
INTRAVENOUS | Status: AC
  Filled 2014-09-08: qty 8

## 2014-09-08 MED ORDER — DEXAMETHASONE SODIUM PHOSPHATE 10 MG/ML IJ SOLN
10.0000 mg | Freq: Once | INTRAMUSCULAR | Status: AC
  Administered 2014-09-08: 10 mg via INTRAVENOUS

## 2014-09-08 NOTE — Patient Instructions (Signed)
[facility] Discharge Instructions for Patients Receiving Chemotherapy  Today you received the following chemotherapy agents kyprolis  To help prevent nausea and vomiting after your treatment, we encourage you to take your nausea medication as directed   If you develop nausea and vomiting that is not controlled by your nausea medication, call the clinic.   BELOW ARE SYMPTOMS THAT SHOULD BE REPORTED IMMEDIATELY:  *FEVER GREATER THAN 100.5 F  *CHILLS WITH OR WITHOUT FEVER  NAUSEA AND VOMITING THAT IS NOT CONTROLLED WITH YOUR NAUSEA MEDICATION  *UNUSUAL SHORTNESS OF BREATH  *UNUSUAL BRUISING OR BLEEDING  TENDERNESS IN MOUTH AND THROAT WITH OR WITHOUT PRESENCE OF ULCERS  *URINARY PROBLEMS  *BOWEL PROBLEMS  UNUSUAL RASH Items with * indicate a potential emergency and should be followed up as soon as possible.  Feel free to call the clinic you have any questions or concerns. The clinic phone number is [phone].  

## 2014-09-10 ENCOUNTER — Other Ambulatory Visit: Payer: Self-pay | Admitting: Hematology and Oncology

## 2014-09-10 ENCOUNTER — Ambulatory Visit (HOSPITAL_BASED_OUTPATIENT_CLINIC_OR_DEPARTMENT_OTHER): Payer: Medicare Other

## 2014-09-10 DIAGNOSIS — C9000 Multiple myeloma not having achieved remission: Secondary | ICD-10-CM

## 2014-09-10 DIAGNOSIS — D61818 Other pancytopenia: Secondary | ICD-10-CM

## 2014-09-10 MED ORDER — TBO-FILGRASTIM 480 MCG/0.8ML ~~LOC~~ SOSY
480.0000 ug | PREFILLED_SYRINGE | Freq: Once | SUBCUTANEOUS | Status: DC
  Administered 2014-09-10: 480 ug via SUBCUTANEOUS

## 2014-09-10 MED ORDER — FILGRASTIM 480 MCG/0.8ML IJ SOSY: 480.0000 ug | PREFILLED_SYRINGE | Freq: Once | INTRAMUSCULAR | Status: DC

## 2014-09-10 MED ORDER — TBO-FILGRASTIM 480 MCG/0.8ML ~~LOC~~ SOSY
480.0000 ug | PREFILLED_SYRINGE | Freq: Once | SUBCUTANEOUS | Status: DC
  Filled 2014-09-10: qty 0.8

## 2014-09-10 NOTE — Patient Instructions (Signed)

## 2014-09-13 NOTE — Telephone Encounter (Signed)
RECEIVED A FAX FROM [facility] CONCERNING A CONFIRMATION OF PRESCRIPTION SHIPMENT FOR REVLIMID ON 09/10/14.

## 2014-09-20 ENCOUNTER — Ambulatory Visit (HOSPITAL_BASED_OUTPATIENT_CLINIC_OR_DEPARTMENT_OTHER): Payer: Medicare Other | Admitting: Hematology and Oncology

## 2014-09-20 ENCOUNTER — Telehealth: Payer: Self-pay | Admitting: Hematology and Oncology

## 2014-09-20 ENCOUNTER — Ambulatory Visit (HOSPITAL_BASED_OUTPATIENT_CLINIC_OR_DEPARTMENT_OTHER): Payer: Medicare Other

## 2014-09-20 ENCOUNTER — Ambulatory Visit (HOSPITAL_BASED_OUTPATIENT_CLINIC_OR_DEPARTMENT_OTHER): Payer: Medicare Other | Admitting: Lab

## 2014-09-20 ENCOUNTER — Encounter: Payer: Self-pay | Admitting: Hematology and Oncology

## 2014-09-20 VITALS — BP 116/69 | HR 51 | Temp 98.7°F | Resp 18 | Ht 67.0 in | Wt 138.9 lb

## 2014-09-20 DIAGNOSIS — C9000 Multiple myeloma not having achieved remission: Secondary | ICD-10-CM

## 2014-09-20 DIAGNOSIS — N186 End stage renal disease: Secondary | ICD-10-CM

## 2014-09-20 DIAGNOSIS — D630 Anemia in neoplastic disease: Secondary | ICD-10-CM | POA: Diagnosis not present

## 2014-09-20 DIAGNOSIS — Z992 Dependence on renal dialysis: Secondary | ICD-10-CM

## 2014-09-20 DIAGNOSIS — D702 Other drug-induced agranulocytosis: Secondary | ICD-10-CM | POA: Diagnosis not present

## 2014-09-20 DIAGNOSIS — Z5112 Encounter for antineoplastic immunotherapy: Secondary | ICD-10-CM

## 2014-09-20 LAB — COMPREHENSIVE METABOLIC PANEL (CC13)
ALK PHOS: 37 U/L — AB (ref 40–150)
ALT: 13 U/L (ref 0–55)
ANION GAP: 13 meq/L — AB (ref 3–11)
AST: 14 U/L (ref 5–34)
Albumin: 3.1 g/dL — ABNORMAL LOW (ref 3.5–5.0)
BILIRUBIN TOTAL: 0.69 mg/dL (ref 0.20–1.20)
BUN: 51.6 mg/dL — ABNORMAL HIGH (ref 7.0–26.0)
CO2: 27 meq/L (ref 22–29)
CREATININE: 9.1 mg/dL — AB (ref 0.6–1.1)
Calcium: 9.2 mg/dL (ref 8.4–10.4)
Chloride: 96 mEq/L — ABNORMAL LOW (ref 98–109)
EGFR: 4 mL/min/{1.73_m2} — AB (ref >90)
GLUCOSE: 88 mg/dL (ref 70–140)
Potassium: 4.4 mEq/L (ref 3.5–5.1)
Sodium: 137 mEq/L (ref 136–145)
Total Protein: 7.1 g/dL (ref 6.4–8.3)

## 2014-09-20 LAB — CBC WITH DIFFERENTIAL/PLATELET
BASO%: 0.7 % (ref 0.0–2.0)
Basophils Absolute: 0 10*3/uL (ref 0.0–0.1)
EOS%: 2.8 % (ref 0.0–7.0)
Eosinophils Absolute: 0.1 10*3/uL (ref 0.0–0.5)
HEMATOCRIT: 32 % — AB (ref 34.8–46.6)
HGB: 9.9 g/dL — ABNORMAL LOW (ref 11.6–15.9)
LYMPH%: 36.7 % (ref 14.0–49.7)
MCH: 31.4 pg (ref 25.1–34.0)
MCHC: 30.9 g/dL — ABNORMAL LOW (ref 31.5–36.0)
MCV: 101.7 fL — AB (ref 79.5–101.0)
MONO#: 0.2 10*3/uL (ref 0.1–0.9)
MONO%: 8.7 % (ref 0.0–14.0)
NEUT#: 1.4 10*3/uL — ABNORMAL LOW (ref 1.5–6.5)
NEUT%: 51.1 % (ref 38.4–76.8)
PLATELETS: 143 10*3/uL — AB (ref 145–400)
RBC: 3.14 10*6/uL — AB (ref 3.70–5.45)
RDW: 17.5 % — ABNORMAL HIGH (ref 11.2–14.5)
WBC: 2.8 10*3/uL — AB (ref 3.9–10.3)
lymph#: 1 10*3/uL (ref 0.9–3.3)

## 2014-09-20 MED ORDER — DEXAMETHASONE SODIUM PHOSPHATE 10 MG/ML IJ SOLN
10.0000 mg | Freq: Once | INTRAMUSCULAR | Status: AC
  Administered 2014-09-20: 10 mg via INTRAVENOUS

## 2014-09-20 MED ORDER — ONDANSETRON 8 MG/NS 50 ML IVPB
INTRAVENOUS | Status: AC
  Filled 2014-09-20: qty 8

## 2014-09-20 MED ORDER — DEXTROSE 5 % IV SOLN
15.0000 mg/m2 | Freq: Once | INTRAVENOUS | Status: AC
  Administered 2014-09-20: 26 mg via INTRAVENOUS
  Filled 2014-09-20: qty 13

## 2014-09-20 MED ORDER — DEXAMETHASONE SODIUM PHOSPHATE 10 MG/ML IJ SOLN
INTRAMUSCULAR | Status: AC
  Filled 2014-09-20: qty 1

## 2014-09-20 MED ORDER — ONDANSETRON 8 MG/50ML IVPB (CHCC)
8.0000 mg | Freq: Once | INTRAVENOUS | Status: AC
  Administered 2014-09-20: 8 mg via INTRAVENOUS

## 2014-09-20 MED ORDER — SODIUM CHLORIDE 0.9 % IV SOLN
Freq: Once | INTRAVENOUS | Status: AC
  Administered 2014-09-20: 15:00:00 via INTRAVENOUS

## 2014-09-20 MED ORDER — SODIUM CHLORIDE 0.9 % IV SOLN: Freq: Once | INTRAVENOUS | Status: DC

## 2014-09-20 NOTE — Assessment & Plan Note (Signed)
She has extensive treatment protocol modification to accommodate for the need for hemodialysis and side effects. I will try to accommodate to her needs and we shall continue modified dose chemotherapy with Kyprolis, Revlimid and dexamethasone along with G-CSF support Her most recent IgG level and M SPike are improving. Will continue the same

## 2014-09-20 NOTE — Assessment & Plan Note (Signed)
She will continue hemodialysis 3 times a week through the fistula on Tuesday, Thursday and Saturday.

## 2014-09-20 NOTE — Assessment & Plan Note (Signed)
This is likely due to recent treatment. The patient denies recent history of fevers, cough, chills, diarrhea or dysuria. She is asymptomatic from the leukopenia. I will observe for now.  I will continue the chemotherapy at current dose without dosage adjustment.  If the leukopenia gets progressive worse in the future, I might have to delay her treatment or adjust the chemotherapy dose. She will get GCSF support on Fridays but will skip the next 2 Rx due to the holidays

## 2014-09-20 NOTE — Progress Notes (Signed)
[name] OFFICE PROGRESS NOTE  Patient Care Team: No Pcp Per Patient as PCP - General (General Practice)  SUMMARY OF ONCOLOGIC HISTORY:   Multiple myeloma   03/30/2008 Initial Diagnosis Multiple myeloma   03/30/2008 - 05/01/2012 Chemotherapy Revlimid/Dex then Velcade (started on 07/13/2011 ending on 09/14/2011) without response.  Managed by Dr. Chauncey [name] of [place], [place] [address]   07/24/2010 -  Chemotherapy Received zometa  intermittently over one year.  Creatinine was 1.0 on 07/24/2010. 1.7 on 08/10/2011.    05/01/2012 - 09/01/2012 Chemotherapy Starte Pomalidomide s/p 4 cycles. Referred to [facility] for consideration of ASCT.     10/08/2012 Bone Marrow Biopsy Normocellular marrow with residual/recurrently plasma cell myeloma, 60-70 % of overall marrow celluarity.  Flow: c/w plasma cell neoplasm, plasma cell infiltrate comprises 60-70 % of overall marrow cellularity; FISH: + for 1 q21/CKS1B gain; RB1 (13q14)   10/08/2012 Bone Marrow Biopsy Continued... Cytogenetics showed normal female karyotype.    10/29/2012 Tumor Marker High IgG (6019), high lamda free light chain (409.32) with low IgA, IgM and kappa free light chain.    04/21/2013 Imaging Skeletal survey.  No suspcious lytic or blastic lesions visualized.  Severe multilevel degenerative disc disease in the cervical spine.    04/29/2013 Imaging PeT. No discrete suspicious focus of FDG uptake identified.    05/20/2013 - 06/09/2013 Chemotherapy Started carfilxomib 39 mg on 08/25, 08/26, day 8 (06/02/13), day 9 on 06/03/2013 and on 06/09/2013.     12/03/2013 Treatment Plan Change Last office visit with Dr. Glenna [name].  She decline further chemotherapy.   Her son passed away and she relocated to [place] to stay with her daughter [name].    03/11/2014 - 03/17/2014 Hospital Admission Admitted for low hemoglobin. Dr. Burney [name] consulted.  SPEP,  UPEP and Bone marrow biopsy obtained.    IgG 11,300. M-spike 7.52, kappa lamda ratio 0 with  lambda free light chains totaling 1260.    03/12/2014 Imaging Skeletal survey. 1. Small lytic lesions within the skull and right scapula. 2. Degenerative changes in the spine and knees, right greater than left. 3. Cardiomegaly without pulmonary edema.   03/17/2014 Bone Marrow Biopsy Hypercellular bone marrow tih extensive involvment by plasma cell neoplasm (Plasma cells 85%). FISH: Presence of 2 copies of the IGH/FGFR3, ATM, CCND1/IGH, and p53 probes.  Loss of 13q34 probes in 40% of cells. gain of chromose 12 and either loss of chorm   03/25/2014 -  Chemotherapy Planning Kyprolis 15 mg/m2 plus dexamethasone weekly plus revlimid (8m) daily on 06/29.   06/14/2014 -  Chemotherapy Patient's last dose of Kyprolis was on 05/26/14. Since then chemo held due to neutropenia despite GCSF support. Revlimid also on hold as of 06/14/14   06/28/2014 -  Chemotherapy New cycle of Kyprolis starts today.   07/07/2014 Tumor Marker IgG 3960    07/23/2014 Tumor Marker IgG 3620    INTERVAL HISTORY: Please see below for problem oriented charting. She feels well. Denies side-effects from treatment. No new back pain. The patient denies any recent signs or symptoms of bleeding such as spontaneous epistaxis, hematuria or hematochezia.   REVIEW OF SYSTEMS:   Constitutional: Denies fevers, chills or abnormal weight loss Eyes: Denies blurriness of vision Ears, nose, mouth, throat, and face: Denies mucositis or sore throat Respiratory: Denies cough, dyspnea or wheezes Cardiovascular: Denies palpitation, chest discomfort or lower extremity swelling Gastrointestinal:  Denies nausea, heartburn or change in bowel habits Skin: Denies abnormal skin rashes Lymphatics: Denies new lymphadenopathy or easy bruising  Neurological:Denies numbness, tingling or new weaknesses Behavioral/Psych: Mood is stable, no new changes  All other systems were reviewed with the patient and are negative.  I have reviewed the past medical history, past  surgical history, social history and family history with the patient and they are unchanged from previous note.  ALLERGIES:  has No Known Allergies.  MEDICATIONS:  Current Outpatient Prescriptions  Medication Sig Dispense Refill  . acetaminophen-codeine (TYLENOL #3) 300-30 MG per tablet Take by mouth every 4 (four) hours as needed for moderate pain.    Marland Kitchen acyclovir (ZOVIRAX) 400 MG tablet Take 1 tablet (400 mg total) by mouth daily. 60 tablet 1  . amLODipine (NORVASC) 10 MG tablet Take 10 mg by mouth daily.     Marland Kitchen aspirin EC 81 MG tablet Take 81 mg by mouth daily.    . cyproheptadine (PERIACTIN) 4 MG tablet Take 4 mg by mouth 2 (two) times daily.    Marland Kitchen dexamethasone (DECADRON) 4 MG tablet Take 3 tablets (12 mg) on days 1,8,15, and 22 of chemotherapy. 24 tablet 2  . furosemide (LASIX) 20 MG tablet Take 1 tablet (20 mg total) by mouth daily. PRN for leg edema 60 tablet 2  . hydrALAZINE (APRESOLINE) 100 MG tablet Take 100 mg by mouth every 8 (eight) hours.    Marland Kitchen HYDROcodone-homatropine (HYCODAN) 5-1.5 MG/5ML syrup Take 5 mLs by mouth every 6 (six) hours as needed for cough. 120 mL 0  . isosorbide mononitrate (IMDUR) 60 MG 24 hr tablet Take 60 mg by mouth daily.    Marland Kitchen lenalidomide (REVLIMID) 2.5 MG capsule Take 1 capsule (2.5 mg total) by mouth daily. 28 capsule 0  . LORazepam (ATIVAN) 0.5 MG tablet Take 1 tablet (0.5 mg total) by mouth every 6 (six) hours as needed (Nausea or vomiting). 30 tablet 0  . metolazone (ZAROXOLYN) 5 MG tablet Take 5 mg by mouth 2 (two) times daily.    . Multiple Vitamins-Iron (DAILY-VITE/IRON PO) Take 1 tablet by mouth daily.    . ondansetron (ZOFRAN) 8 MG tablet Take 1 tablet (8 mg total) by mouth 2 (two) times daily. Start the day after chemo for 2 days. Then as needed for nausea or vomiting. 30 tablet 1  . prochlorperazine (COMPAZINE) 10 MG tablet Take 1 tablet (10 mg total) by mouth every 6 (six) hours as needed (Nausea or vomiting). 30 tablet 1  . sevelamer carbonate  (RENVELA) 800 MG tablet Take 800 mg by mouth 3 (three) times daily with meals.     No current facility-administered medications for this visit.   Facility-Administered Medications Ordered in Other Visits  Medication Dose Route Frequency Provider Last Rate Last Dose  . 0.9 %  sodium chloride infusion   Intravenous Once Aasim Marla Roe, MD        PHYSICAL EXAMINATION: ECOG PERFORMANCE STATUS: 0 - Asymptomatic  Filed Vitals:   09/20/14 1222  BP: 116/69  Pulse: 51  Temp: 98.7 F (37.1 C)  Resp: 18   Filed Weights   09/20/14 1222  Weight: 138 lb 14.4 oz (63.005 kg)    GENERAL:alert, no distress and comfortable SKIN: skin color, texture, turgor are normal, no rashes or significant lesions EYES: normal, Conjunctiva are pink and non-injected, sclera clear OROPHARYNX:no exudate, no erythema and lips, buccal mucosa, and tongue normal  NECK: supple, thyroid normal size, non-tender, without nodularity LYMPH:  no palpable lymphadenopathy in the cervical, axillary or inguinal LUNGS: clear to auscultation and percussion with normal breathing effort HEART: regular rate & rhythm  and no murmurs and no lower extremity edema ABDOMEN:abdomen soft, non-tender and normal bowel sounds Musculoskeletal:no cyanosis of digits and no clubbing. Dialysis port oK NEURO: alert & oriented x 3 with fluent speech, no focal motor/sensory deficits  LABORATORY DATA:  I have reviewed the data as listed    Component Value Date/Time   NA 137 09/20/2014 1207   NA 134* 05/10/2014 1439   K 4.4 09/20/2014 1207   K 4.8 05/10/2014 1439   CL 93* 05/10/2014 1439   CO2 27 09/20/2014 1207   CO2 26 05/10/2014 1439   GLUCOSE 88 09/20/2014 1207   GLUCOSE 84 05/10/2014 1439   BUN 51.6* 09/20/2014 1207   BUN 40* 05/10/2014 1439   CREATININE 9.1* 09/20/2014 1207   CREATININE 9.86* 05/10/2014 1439   CALCIUM 9.2 09/20/2014 1207   CALCIUM 9.0 05/10/2014 1439   PROT 7.1 09/20/2014 1207   PROT 10.3* 05/10/2014 1439    ALBUMIN 3.1* 09/20/2014 1207   ALBUMIN 3.0* 05/10/2014 1439   AST 14 09/20/2014 1207   AST 17 05/10/2014 1439   ALT 13 09/20/2014 1207   ALT 7 05/10/2014 1439   ALKPHOS 37* 09/20/2014 1207   ALKPHOS 50 05/10/2014 1439   BILITOT 0.69 09/20/2014 1207   BILITOT 0.4 05/10/2014 1439   GFRNONAA 4* 03/18/2014 0500   GFRAA 5* 03/18/2014 0500    No results found for: SPEP, UPEP  Lab Results  Component Value Date   WBC 2.8* 09/20/2014   NEUTROABS 1.4* 09/20/2014   HGB 9.9* 09/20/2014   HCT 32.0* 09/20/2014   MCV 101.7* 09/20/2014   PLT 143* 09/20/2014      Chemistry      Component Value Date/Time   NA 137 09/20/2014 1207   NA 134* 05/10/2014 1439   K 4.4 09/20/2014 1207   K 4.8 05/10/2014 1439   CL 93* 05/10/2014 1439   CO2 27 09/20/2014 1207   CO2 26 05/10/2014 1439   BUN 51.6* 09/20/2014 1207   BUN 40* 05/10/2014 1439   CREATININE 9.1* 09/20/2014 1207   CREATININE 9.86* 05/10/2014 1439      Component Value Date/Time   CALCIUM 9.2 09/20/2014 1207   CALCIUM 9.0 05/10/2014 1439   ALKPHOS 37* 09/20/2014 1207   ALKPHOS 50 05/10/2014 1439   AST 14 09/20/2014 1207   AST 17 05/10/2014 1439   ALT 13 09/20/2014 1207   ALT 7 05/10/2014 1439   BILITOT 0.69 09/20/2014 1207   BILITOT 0.4 05/10/2014 1439      ASSESSMENT & PLAN:  Multiple myeloma She has extensive treatment protocol modification to accommodate for the need for hemodialysis and side effects. I will try to accommodate to her needs and we shall continue modified dose chemotherapy with Kyprolis, Revlimid and dexamethasone along with G-CSF support Her most recent IgG level and M SPike are improving. Will continue the same  ESRD (end stage renal disease) She will continue hemodialysis 3 times a week through the fistula on Tuesday, Thursday and Saturday.  Anemia in neoplastic disease She is not symptomatic and does not require transfusion or erythropoietin stimulating agent right now.  Drug-induced  neutropenia This is likely due to recent treatment. The patient denies recent history of fevers, cough, chills, diarrhea or dysuria. She is asymptomatic from the leukopenia. I will observe for now.  I will continue the chemotherapy at current dose without dosage adjustment.  If the leukopenia gets progressive worse in the future, I might have to delay her treatment or adjust the chemotherapy dose. She  will get GCSF support on Fridays but will skip the next 2 Rx due to the holidays     Orders Placed This Encounter  Procedures  . SPEP & IFE with QIG    Standing Status: Future     Number of Occurrences:      Standing Expiration Date: 10/25/2015  . Kappa/lambda light chains    Standing Status: Future     Number of Occurrences:      Standing Expiration Date: 10/25/2015   All questions were answered. The patient knows to call the clinic with any problems, questions or concerns. No barriers to learning was detected. I spent 30 minutes counseling the patient face to face. The total time spent in the appointment was 40 minutes and more than 50% was on counseling and review of test results     [facility], [place], MD 09/20/2014 1:20 PM

## 2014-09-20 NOTE — Patient Instructions (Signed)
[facility] Discharge Instructions for Patients Receiving Chemotherapy  Today you received the following chemotherapy agent Kyprolis.  To help prevent nausea and vomiting after your treatment, we encourage you to take your nausea medication.   If you develop nausea and vomiting that is not controlled by your nausea medication, call the clinic.   BELOW ARE SYMPTOMS THAT SHOULD BE REPORTED IMMEDIATELY:  *FEVER GREATER THAN 100.5 F  *CHILLS WITH OR WITHOUT FEVER  NAUSEA AND VOMITING THAT IS NOT CONTROLLED WITH YOUR NAUSEA MEDICATION  *UNUSUAL SHORTNESS OF BREATH  *UNUSUAL BRUISING OR BLEEDING  TENDERNESS IN MOUTH AND THROAT WITH OR WITHOUT PRESENCE OF ULCERS  *URINARY PROBLEMS  *BOWEL PROBLEMS  UNUSUAL RASH Items with * indicate a potential emergency and should be followed up as soon as possible.  Feel free to call the clinic you have any questions or concerns. The clinic phone number is [phone].    

## 2014-09-20 NOTE — Telephone Encounter (Signed)
gv and printeda ptp sched and avs fo rpt for DEC thru Feb 2016

## 2014-09-20 NOTE — Assessment & Plan Note (Signed)
She is not symptomatic and does not require transfusion or erythropoietin stimulating agent right now.

## 2014-09-22 ENCOUNTER — Ambulatory Visit (HOSPITAL_BASED_OUTPATIENT_CLINIC_OR_DEPARTMENT_OTHER): Payer: Medicare Other

## 2014-09-22 DIAGNOSIS — C9000 Multiple myeloma not having achieved remission: Secondary | ICD-10-CM

## 2014-09-22 DIAGNOSIS — Z5112 Encounter for antineoplastic immunotherapy: Secondary | ICD-10-CM

## 2014-09-22 LAB — SPEP & IFE WITH QIG
Albumin ELP: 52.3 % — ABNORMAL LOW (ref 55.8–66.1)
Alpha-1-Globulin: 4.2 % (ref 2.9–4.9)
Alpha-2-Globulin: 6.5 % — ABNORMAL LOW (ref 7.1–11.8)
BETA 2: 2.3 % — AB (ref 3.2–6.5)
Beta Globulin: 3.3 % — ABNORMAL LOW (ref 4.7–7.2)
GAMMA GLOBULIN: 31.4 % — AB (ref 11.1–18.8)
IGG (IMMUNOGLOBIN G), SERUM: 2200 mg/dL — AB (ref 690–1700)
M-SPIKE, %: 2.08 g/dL
Total Protein, Serum Electrophoresis: 7.2 g/dL (ref 6.0–8.3)

## 2014-09-22 LAB — KAPPA/LAMBDA LIGHT CHAINS
KAPPA LAMBDA RATIO: 0 — AB (ref 0.26–1.65)
Kappa free light chain: 0.94 mg/dL (ref 0.33–1.94)
LAMBDA FREE LGHT CHN: 320 mg/dL — AB (ref 0.57–2.63)

## 2014-09-22 MED ORDER — DEXAMETHASONE SODIUM PHOSPHATE 10 MG/ML IJ SOLN
10.0000 mg | Freq: Once | INTRAMUSCULAR | Status: AC
  Administered 2014-09-22: 10 mg via INTRAVENOUS

## 2014-09-22 MED ORDER — ONDANSETRON 8 MG/NS 50 ML IVPB
INTRAVENOUS | Status: AC
  Filled 2014-09-22: qty 8

## 2014-09-22 MED ORDER — DEXAMETHASONE SODIUM PHOSPHATE 10 MG/ML IJ SOLN
INTRAMUSCULAR | Status: AC
  Filled 2014-09-22: qty 1

## 2014-09-22 MED ORDER — SODIUM CHLORIDE 0.9 % IV SOLN
Freq: Once | INTRAVENOUS | Status: AC
  Administered 2014-09-22: 13:00:00 via INTRAVENOUS

## 2014-09-22 MED ORDER — ONDANSETRON 8 MG/50ML IVPB (CHCC)
8.0000 mg | Freq: Once | INTRAVENOUS | Status: AC
  Administered 2014-09-22: 8 mg via INTRAVENOUS

## 2014-09-22 MED ORDER — SODIUM CHLORIDE 0.9 % IV SOLN
Freq: Once | INTRAVENOUS | Status: AC
  Administered 2014-09-22: 11:00:00 via INTRAVENOUS

## 2014-09-22 MED ORDER — DEXTROSE 5 % IV SOLN
15.0000 mg/m2 | Freq: Once | INTRAVENOUS | Status: AC
  Administered 2014-09-22: 26 mg via INTRAVENOUS
  Filled 2014-09-22: qty 13

## 2014-09-22 NOTE — Patient Instructions (Signed)
[facility] Discharge Instructions for Patients Receiving Chemotherapy  Today you received the following chemotherapy agents Kyprolis.  To help prevent nausea and vomiting after your treatment, we encourage you to take your nausea medication.   If you develop nausea and vomiting that is not controlled by your nausea medication, call the clinic.   BELOW ARE SYMPTOMS THAT SHOULD BE REPORTED IMMEDIATELY:  *FEVER GREATER THAN 100.5 F  *CHILLS WITH OR WITHOUT FEVER  NAUSEA AND VOMITING THAT IS NOT CONTROLLED WITH YOUR NAUSEA MEDICATION  *UNUSUAL SHORTNESS OF BREATH  *UNUSUAL BRUISING OR BLEEDING  TENDERNESS IN MOUTH AND THROAT WITH OR WITHOUT PRESENCE OF ULCERS  *URINARY PROBLEMS  *BOWEL PROBLEMS  UNUSUAL RASH Items with * indicate a potential emergency and should be followed up as soon as possible.  Feel free to call the clinic you have any questions or concerns. The clinic phone number is [phone].    

## 2014-09-25 ENCOUNTER — Ambulatory Visit: Payer: Self-pay

## 2014-09-27 ENCOUNTER — Other Ambulatory Visit (HOSPITAL_BASED_OUTPATIENT_CLINIC_OR_DEPARTMENT_OTHER): Payer: Medicare Other

## 2014-09-27 ENCOUNTER — Ambulatory Visit (HOSPITAL_BASED_OUTPATIENT_CLINIC_OR_DEPARTMENT_OTHER): Payer: Medicare Other

## 2014-09-27 DIAGNOSIS — C9000 Multiple myeloma not having achieved remission: Secondary | ICD-10-CM

## 2014-09-27 DIAGNOSIS — N186 End stage renal disease: Secondary | ICD-10-CM

## 2014-09-27 DIAGNOSIS — Z5112 Encounter for antineoplastic immunotherapy: Secondary | ICD-10-CM

## 2014-09-27 LAB — COMPREHENSIVE METABOLIC PANEL (CC13)
ALT: 12 U/L (ref 0–55)
ANION GAP: 10 meq/L (ref 3–11)
AST: 13 U/L (ref 5–34)
Albumin: 3.2 g/dL — ABNORMAL LOW (ref 3.5–5.0)
Alkaline Phosphatase: 36 U/L — ABNORMAL LOW (ref 40–150)
BILIRUBIN TOTAL: 0.76 mg/dL (ref 0.20–1.20)
BUN: 34.7 mg/dL — ABNORMAL HIGH (ref 7.0–26.0)
CALCIUM: 9.2 mg/dL (ref 8.4–10.4)
CHLORIDE: 96 meq/L — AB (ref 98–109)
CO2: 30 meq/L — AB (ref 22–29)
Creatinine: 7.2 mg/dL (ref 0.6–1.1)
EGFR: 6 mL/min/{1.73_m2} — ABNORMAL LOW (ref >90)
Glucose: 84 mg/dl (ref 70–140)
POTASSIUM: 4.7 meq/L (ref 3.5–5.1)
Sodium: 136 mEq/L (ref 136–145)
TOTAL PROTEIN: 7 g/dL (ref 6.4–8.3)

## 2014-09-27 LAB — CBC WITH DIFFERENTIAL/PLATELET
BASO%: 1.4 % (ref 0.0–2.0)
Basophils Absolute: 0 10*3/uL (ref 0.0–0.1)
EOS%: 3.7 % (ref 0.0–7.0)
Eosinophils Absolute: 0.1 10*3/uL (ref 0.0–0.5)
HEMATOCRIT: 32.6 % — AB (ref 34.8–46.6)
HGB: 10.1 g/dL — ABNORMAL LOW (ref 11.6–15.9)
LYMPH%: 41.7 % (ref 14.0–49.7)
MCH: 31.8 pg (ref 25.1–34.0)
MCHC: 31 g/dL — AB (ref 31.5–36.0)
MCV: 102.8 fL — AB (ref 79.5–101.0)
MONO#: 0.3 10*3/uL (ref 0.1–0.9)
MONO%: 12.4 % (ref 0.0–14.0)
NEUT#: 1 10*3/uL — ABNORMAL LOW (ref 1.5–6.5)
NEUT%: 40.8 % (ref 38.4–76.8)
PLATELETS: 146 10*3/uL (ref 145–400)
RBC: 3.18 10*6/uL — ABNORMAL LOW (ref 3.70–5.45)
RDW: 17.8 % — ABNORMAL HIGH (ref 11.2–14.5)
WBC: 2.4 10*3/uL — ABNORMAL LOW (ref 3.9–10.3)
lymph#: 1 10*3/uL (ref 0.9–3.3)

## 2014-09-27 MED ORDER — SODIUM CHLORIDE 0.9 % IV SOLN
Freq: Once | INTRAVENOUS | Status: AC
  Administered 2014-09-27: 12:00:00 via INTRAVENOUS

## 2014-09-27 MED ORDER — ONDANSETRON 8 MG/50ML IVPB (CHCC)
8.0000 mg | Freq: Once | INTRAVENOUS | Status: AC
  Administered 2014-09-27: 8 mg via INTRAVENOUS

## 2014-09-27 MED ORDER — DEXTROSE 5 % IV SOLN
15.0000 mg/m2 | Freq: Once | INTRAVENOUS | Status: AC
  Administered 2014-09-27: 26 mg via INTRAVENOUS
  Filled 2014-09-27: qty 13

## 2014-09-27 MED ORDER — ONDANSETRON 8 MG/NS 50 ML IVPB
INTRAVENOUS | Status: AC
  Filled 2014-09-27: qty 8

## 2014-09-27 MED ORDER — DEXAMETHASONE SODIUM PHOSPHATE 10 MG/ML IJ SOLN
10.0000 mg | Freq: Once | INTRAMUSCULAR | Status: AC
  Administered 2014-09-27: 10 mg via INTRAVENOUS

## 2014-09-27 MED ORDER — DEXAMETHASONE SODIUM PHOSPHATE 10 MG/ML IJ SOLN
INTRAMUSCULAR | Status: AC
  Filled 2014-09-27: qty 1

## 2014-09-27 NOTE — Progress Notes (Signed)
Pt's ABS NEUT noted at 1.0. Verbal consent given by [name] to treat patient with Neut count of 1.0.

## 2014-09-27 NOTE — Patient Instructions (Signed)
[facility] Discharge Instructions for Patients Receiving Chemotherapy  Today you received the following chemotherapy agents Kyprolis.  To help prevent nausea and vomiting after your treatment, we encourage you to take your nausea medication.   If you develop nausea and vomiting that is not controlled by your nausea medication, call the clinic.   BELOW ARE SYMPTOMS THAT SHOULD BE REPORTED IMMEDIATELY:  *FEVER GREATER THAN 100.5 F  *CHILLS WITH OR WITHOUT FEVER  NAUSEA AND VOMITING THAT IS NOT CONTROLLED WITH YOUR NAUSEA MEDICATION  *UNUSUAL SHORTNESS OF BREATH  *UNUSUAL BRUISING OR BLEEDING  TENDERNESS IN MOUTH AND THROAT WITH OR WITHOUT PRESENCE OF ULCERS  *URINARY PROBLEMS  *BOWEL PROBLEMS  UNUSUAL RASH Items with * indicate a potential emergency and should be followed up as soon as possible.  Feel free to call the clinic you have any questions or concerns. The clinic phone number is [phone].    

## 2014-09-28 ENCOUNTER — Ambulatory Visit: Payer: Self-pay

## 2014-09-29 ENCOUNTER — Other Ambulatory Visit: Payer: Self-pay | Admitting: Hematology and Oncology

## 2014-09-29 ENCOUNTER — Ambulatory Visit (HOSPITAL_BASED_OUTPATIENT_CLINIC_OR_DEPARTMENT_OTHER): Payer: Medicare Other

## 2014-09-29 DIAGNOSIS — Z5112 Encounter for antineoplastic immunotherapy: Secondary | ICD-10-CM

## 2014-09-29 DIAGNOSIS — C9000 Multiple myeloma not having achieved remission: Secondary | ICD-10-CM

## 2014-09-29 MED ORDER — DEXAMETHASONE SODIUM PHOSPHATE 10 MG/ML IJ SOLN
10.0000 mg | Freq: Once | INTRAMUSCULAR | Status: AC
  Administered 2014-09-29: 10 mg via INTRAVENOUS

## 2014-09-29 MED ORDER — SODIUM CHLORIDE 0.9 % IV SOLN: Freq: Once | INTRAVENOUS | Status: DC

## 2014-09-29 MED ORDER — ONDANSETRON 8 MG/NS 50 ML IVPB
INTRAVENOUS | Status: AC
  Filled 2014-09-29: qty 8

## 2014-09-29 MED ORDER — ONDANSETRON 8 MG/50ML IVPB (CHCC)
8.0000 mg | Freq: Once | INTRAVENOUS | Status: AC
  Administered 2014-09-29: 8 mg via INTRAVENOUS

## 2014-09-29 MED ORDER — DEXAMETHASONE SODIUM PHOSPHATE 10 MG/ML IJ SOLN
INTRAMUSCULAR | Status: AC
  Filled 2014-09-29: qty 1

## 2014-09-29 MED ORDER — SODIUM CHLORIDE 0.9 % IV SOLN
Freq: Once | INTRAVENOUS | Status: AC
  Administered 2014-09-29: 12:00:00 via INTRAVENOUS

## 2014-09-29 MED ORDER — DEXTROSE 5 % IV SOLN
15.0000 mg/m2 | Freq: Once | INTRAVENOUS | Status: AC
  Administered 2014-09-29: 26 mg via INTRAVENOUS
  Filled 2014-09-29: qty 13

## 2014-09-29 NOTE — Patient Instructions (Signed)
Carfilzomib injection What is this medicine? CARFILZOMIB (kar FILZ oh mib) is a chemotherapy drug that works by slowing or stopping cancer cell growth. This medicine is used to treat multiple myeloma. This medicine may be used for other purposes; ask your health care provider or pharmacist if you have questions. COMMON BRAND NAME(S): KYPROLIS What should I tell my health care provider before I take this medicine? They need to know if you have any of these conditions: -heart disease -irregular heartbeat -liver disease -lung or breathing disease -an unusual or allergic reaction to carfilzomib, or other medicines, foods, dyes, or preservatives -pregnant or trying to get pregnant -breast-feeding How should I use this medicine? This medicine is for injection or infusion into a vein. It is given by a health care professional in a hospital or clinic setting. Talk to your pediatrician regarding the use of this medicine in children. Special care may be needed. Overdosage: If you think you've taken too much of this medicine contact a poison control center or emergency room at once. Overdosage: If you think you have taken too much of this medicine contact a poison control center or emergency room at once. NOTE: This medicine is only for you. Do not share this medicine with others. What if I miss a dose? It is important not to miss your dose. Call your doctor or health care professional if you are unable to keep an appointment. What may interact with this medicine? Interactions are not expected. Give your health care provider a list of all the medicines, herbs, non-prescription drugs, or dietary supplements you use. Also tell them if you smoke, drink alcohol, or use illegal drugs. Some items may interact with your medicine. This list may not describe all possible interactions. Give your health care provider a list of all the medicines, herbs, non-prescription drugs, or dietary supplements you use. Also  tell them if you smoke, drink alcohol, or use illegal drugs. Some items may interact with your medicine. What should I watch for while using this medicine? Your condition will be monitored carefully while you are receiving this medicine. Report any side effects. Continue your course of treatment even though you feel ill unless your doctor tells you to stop. Call your doctor or health care professional for advice if you get a fever, chills or sore throat, or other symptoms of a cold or flu. Do not treat yourself. Try to avoid being around people who are sick. Do not become pregnant while taking this medicine. Women should inform their doctor if they wish to become pregnant or think they might be pregnant. There is a potential for serious side effects to an unborn child. Talk to your health care professional or pharmacist for more information. Do not breast-feed an infant while taking this medicine. Check with your doctor or health care professional if you get an attack of severe diarrhea, nausea and vomiting, or if you sweat a lot. The loss of too much body fluid can make it dangerous for you to take this medicine. You may get dizzy. Do not drive, use machinery, or do anything that needs mental alertness until you know how this medicine affects you. Do not stand or sit up quickly, especially if you are an older patient. This reduces the risk of dizzy or fainting spells. What side effects may I notice from receiving this medicine? Side effects that you should report to your doctor or health care professional as soon as possible: -allergic reactions like skin rash, itching or hives,  swelling of the face, lips, or tongue -breathing problems -chest pain or palpitationschest tightness -cough -dark urine -dizziness -feeling faint or lightheaded -fever or chills -general ill feeling or flu-like symptoms -light-colored stools -palpitations -right upper belly pain -swelling of the legs or ankles -unusual  bleeding or bruising -unusually weak or tired -yellowing of the eyes or skin Side effects that usually do not require medical attention (Report these to your doctor or health care professional if they continue or are bothersome.): -diarrhea -headache -nausea, vomiting -tiredness This list may not describe all possible side effects. Call your doctor for medical advice about side effects. You may report side effects to FDA at 1-800-FDA-1088. Where should I keep my medicine? This drug is given in a hospital or clinic and will not be stored at home. NOTE: This sheet is a summary. It may not cover all possible information. If you have questions about this medicine, talk to your doctor, pharmacist, or health care provider.  2015, Elsevier/Gold Standard. (2012-03-07 17:02:29)

## 2014-10-02 ENCOUNTER — Ambulatory Visit: Payer: Self-pay

## 2014-10-04 ENCOUNTER — Ambulatory Visit (HOSPITAL_BASED_OUTPATIENT_CLINIC_OR_DEPARTMENT_OTHER): Payer: Medicare Other

## 2014-10-04 ENCOUNTER — Other Ambulatory Visit (HOSPITAL_BASED_OUTPATIENT_CLINIC_OR_DEPARTMENT_OTHER): Payer: Medicare Other

## 2014-10-04 DIAGNOSIS — C9000 Multiple myeloma not having achieved remission: Secondary | ICD-10-CM

## 2014-10-04 DIAGNOSIS — N186 End stage renal disease: Secondary | ICD-10-CM

## 2014-10-04 DIAGNOSIS — Z5112 Encounter for antineoplastic immunotherapy: Secondary | ICD-10-CM

## 2014-10-04 LAB — COMPREHENSIVE METABOLIC PANEL (CC13)
ALBUMIN: 3.2 g/dL — AB (ref 3.5–5.0)
ALT: 10 U/L (ref 0–55)
AST: 15 U/L (ref 5–34)
Alkaline Phosphatase: 34 U/L — ABNORMAL LOW (ref 40–150)
Anion Gap: 11 mEq/L (ref 3–11)
BUN: 41.5 mg/dL — ABNORMAL HIGH (ref 7.0–26.0)
CALCIUM: 8.9 mg/dL (ref 8.4–10.4)
CO2: 28 mEq/L (ref 22–29)
Chloride: 96 mEq/L — ABNORMAL LOW (ref 98–109)
Creatinine: 7.4 mg/dL (ref 0.6–1.1)
EGFR: 6 mL/min/{1.73_m2} — ABNORMAL LOW (ref >90)
Glucose: 78 mg/dl (ref 70–140)
POTASSIUM: 4.9 meq/L (ref 3.5–5.1)
SODIUM: 135 meq/L — AB (ref 136–145)
TOTAL PROTEIN: 7.1 g/dL (ref 6.4–8.3)
Total Bilirubin: 0.8 mg/dL (ref 0.20–1.20)

## 2014-10-04 LAB — CBC WITH DIFFERENTIAL/PLATELET
BASO%: 0.4 % (ref 0.0–2.0)
BASOS ABS: 0 10*3/uL (ref 0.0–0.1)
EOS%: 3.1 % (ref 0.0–7.0)
Eosinophils Absolute: 0.1 10*3/uL (ref 0.0–0.5)
HEMATOCRIT: 33 % — AB (ref 34.8–46.6)
HEMOGLOBIN: 10.3 g/dL — AB (ref 11.6–15.9)
LYMPH%: 49.2 % (ref 14.0–49.7)
MCH: 32.1 pg (ref 25.1–34.0)
MCHC: 31.2 g/dL — ABNORMAL LOW (ref 31.5–36.0)
MCV: 102.8 fL — AB (ref 79.5–101.0)
MONO#: 0.4 10*3/uL (ref 0.1–0.9)
MONO%: 16.3 % — AB (ref 0.0–14.0)
NEUT#: 0.8 10*3/uL — ABNORMAL LOW (ref 1.5–6.5)
NEUT%: 31 % — AB (ref 38.4–76.8)
PLATELETS: 125 10*3/uL — AB (ref 145–400)
RBC: 3.21 10*6/uL — ABNORMAL LOW (ref 3.70–5.45)
RDW: 17 % — ABNORMAL HIGH (ref 11.2–14.5)
WBC: 2.6 10*3/uL — ABNORMAL LOW (ref 3.9–10.3)
lymph#: 1.3 10*3/uL (ref 0.9–3.3)

## 2014-10-04 MED ORDER — DEXAMETHASONE SODIUM PHOSPHATE 10 MG/ML IJ SOLN
10.0000 mg | Freq: Once | INTRAMUSCULAR | Status: AC
  Administered 2014-10-04: 10 mg via INTRAVENOUS

## 2014-10-04 MED ORDER — SODIUM CHLORIDE 0.9 % IV SOLN: Freq: Once | INTRAVENOUS | Status: DC

## 2014-10-04 MED ORDER — DEXTROSE 5 % IV SOLN
15.0000 mg/m2 | Freq: Once | INTRAVENOUS | Status: AC
  Administered 2014-10-04: 26 mg via INTRAVENOUS
  Filled 2014-10-04: qty 13

## 2014-10-04 MED ORDER — SODIUM CHLORIDE 0.9 % IV SOLN
Freq: Once | INTRAVENOUS | Status: AC
  Administered 2014-10-04: 12:00:00 via INTRAVENOUS

## 2014-10-04 MED ORDER — ONDANSETRON 8 MG/50ML IVPB (CHCC)
8.0000 mg | Freq: Once | INTRAVENOUS | Status: AC
  Administered 2014-10-04: 8 mg via INTRAVENOUS

## 2014-10-04 MED ORDER — ONDANSETRON 8 MG/NS 50 ML IVPB
INTRAVENOUS | Status: AC
  Filled 2014-10-04: qty 8

## 2014-10-04 NOTE — Progress Notes (Signed)
Okay to treat today with ANC of 0.8 per [name].

## 2014-10-04 NOTE — Patient Instructions (Signed)
[facility] Discharge Instructions for Patients Receiving Chemotherapy  Today you received the following chemotherapy agents Kyprolis To help prevent nausea and vomiting after your treatment, we encourage you to take your nausea medication as prescribed.  If you develop nausea and vomiting that is not controlled by your nausea medication, call the clinic.   BELOW ARE SYMPTOMS THAT SHOULD BE REPORTED IMMEDIATELY:  *FEVER GREATER THAN 100.5 F  *CHILLS WITH OR WITHOUT FEVER  NAUSEA AND VOMITING THAT IS NOT CONTROLLED WITH YOUR NAUSEA MEDICATION  *UNUSUAL SHORTNESS OF BREATH  *UNUSUAL BRUISING OR BLEEDING  TENDERNESS IN MOUTH AND THROAT WITH OR WITHOUT PRESENCE OF ULCERS  *URINARY PROBLEMS  *BOWEL PROBLEMS  UNUSUAL RASH Items with * indicate a potential emergency and should be followed up as soon as possible.  Feel free to call the clinic you have any questions or concerns. The clinic phone number is [phone].    

## 2014-10-05 ENCOUNTER — Other Ambulatory Visit: Payer: Self-pay | Admitting: *Deleted

## 2014-10-05 ENCOUNTER — Ambulatory Visit: Payer: Self-pay

## 2014-10-05 NOTE — Telephone Encounter (Signed)
THIS REFILL REQUEST FOR REVLIMID WAS PLACED ON DR.GORSUCH'S DESK. 

## 2014-10-06 ENCOUNTER — Other Ambulatory Visit: Payer: Self-pay | Admitting: *Deleted

## 2014-10-06 ENCOUNTER — Ambulatory Visit (HOSPITAL_BASED_OUTPATIENT_CLINIC_OR_DEPARTMENT_OTHER): Payer: Medicare Other

## 2014-10-06 DIAGNOSIS — C9000 Multiple myeloma not having achieved remission: Secondary | ICD-10-CM

## 2014-10-06 DIAGNOSIS — Z5112 Encounter for antineoplastic immunotherapy: Secondary | ICD-10-CM

## 2014-10-06 LAB — SPEP & IFE WITH QIG
Albumin ELP: 53.8 % — ABNORMAL LOW (ref 55.8–66.1)
Alpha-1-Globulin: 4 % (ref 2.9–4.9)
Alpha-2-Globulin: 6.6 % — ABNORMAL LOW (ref 7.1–11.8)
Beta 2: 2.3 % — ABNORMAL LOW (ref 3.2–6.5)
Beta Globulin: 3.4 % — ABNORMAL LOW (ref 4.7–7.2)
GAMMA GLOBULIN: 29.9 % — AB (ref 11.1–18.8)
IgA: <6 mg/dL — ABNORMAL LOW (ref 69–380)
IgG (Immunoglobin G), Serum: 2230 mg/dL — ABNORMAL HIGH (ref 690–1700)
IgM, Serum: <5 mg/dL — ABNORMAL LOW (ref 52–322)
M-SPIKE, %: 0.06 g/dL
Total Protein, Serum Electrophoresis: 7.1 g/dL (ref 6.0–8.3)

## 2014-10-06 LAB — KAPPA/LAMBDA LIGHT CHAINS
KAPPA LAMBDA RATIO: 0 — AB (ref 0.26–1.65)
Kappa free light chain: 0.45 mg/dL (ref 0.33–1.94)
LAMBDA FREE LGHT CHN: 272 mg/dL — AB (ref 0.57–2.63)

## 2014-10-06 MED ORDER — SODIUM CHLORIDE 0.9 % IV SOLN: Freq: Once | INTRAVENOUS | Status: DC

## 2014-10-06 MED ORDER — DEXAMETHASONE SODIUM PHOSPHATE 10 MG/ML IJ SOLN
10.0000 mg | Freq: Once | INTRAMUSCULAR | Status: AC
  Administered 2014-10-06: 10 mg via INTRAVENOUS

## 2014-10-06 MED ORDER — DEXTROSE 5 % IV SOLN
15.0000 mg/m2 | Freq: Once | INTRAVENOUS | Status: AC
  Administered 2014-10-06: 26 mg via INTRAVENOUS
  Filled 2014-10-06: qty 13

## 2014-10-06 MED ORDER — ONDANSETRON 8 MG/NS 50 ML IVPB
INTRAVENOUS | Status: AC
  Filled 2014-10-06: qty 8

## 2014-10-06 MED ORDER — ONDANSETRON 8 MG/50ML IVPB (CHCC)
8.0000 mg | Freq: Once | INTRAVENOUS | Status: AC
  Administered 2014-10-06: 8 mg via INTRAVENOUS

## 2014-10-06 MED ORDER — SODIUM CHLORIDE 0.9 % IV SOLN
Freq: Once | INTRAVENOUS | Status: AC
  Administered 2014-10-06: 11:00:00 via INTRAVENOUS

## 2014-10-06 MED ORDER — LENALIDOMIDE 2.5 MG PO CAPS: 2.5000 mg | ORAL_CAPSULE | Freq: Every day | ORAL | Status: DC

## 2014-10-06 MED ORDER — DEXAMETHASONE SODIUM PHOSPHATE 10 MG/ML IJ SOLN
INTRAMUSCULAR | Status: AC
  Filled 2014-10-06: qty 1

## 2014-10-06 NOTE — Patient Instructions (Signed)
[facility] Discharge Instructions for Patients Receiving Chemotherapy  Today you received the following chemotherapy agents Kyprolis.  To help prevent nausea and vomiting after your treatment, we encourage you to take your nausea medication.   If you develop nausea and vomiting that is not controlled by your nausea medication, call the clinic.   BELOW ARE SYMPTOMS THAT SHOULD BE REPORTED IMMEDIATELY:  *FEVER GREATER THAN 100.5 F  *CHILLS WITH OR WITHOUT FEVER  NAUSEA AND VOMITING THAT IS NOT CONTROLLED WITH YOUR NAUSEA MEDICATION  *UNUSUAL SHORTNESS OF BREATH  *UNUSUAL BRUISING OR BLEEDING  TENDERNESS IN MOUTH AND THROAT WITH OR WITHOUT PRESENCE OF ULCERS  *URINARY PROBLEMS  *BOWEL PROBLEMS  UNUSUAL RASH Items with * indicate a potential emergency and should be followed up as soon as possible.  Feel free to call the clinic you have any questions or concerns. The clinic phone number is [phone].    

## 2014-10-06 NOTE — Telephone Encounter (Signed)
Patient due for survey. Met patient in infusion room and assisted her with calling [employer] and she completed patient survey.

## 2014-10-08 ENCOUNTER — Telehealth: Payer: Self-pay | Admitting: Hematology and Oncology

## 2014-10-08 ENCOUNTER — Ambulatory Visit: Payer: Self-pay

## 2014-10-08 NOTE — Telephone Encounter (Signed)
returned call s.w pt and advised ok not to have inj per MD...Marland KitchenMarland KitchenMarland Kitchenpt ok and aware to comr to appt on 1.18.15

## 2014-10-16 ENCOUNTER — Inpatient Hospital Stay (HOSPITAL_COMMUNITY)
Admission: EM | Admit: 2014-10-16 | Discharge: 2014-10-19 | DRG: 193 | Disposition: A | Discharge status: Home / Self Care | Payer: Medicare Other | Attending: Internal Medicine | Admitting: Internal Medicine

## 2014-10-16 ENCOUNTER — Emergency Department (HOSPITAL_COMMUNITY): Payer: Medicare Other

## 2014-10-16 DIAGNOSIS — E8770 Fluid overload, unspecified: Secondary | ICD-10-CM | POA: Diagnosis present

## 2014-10-16 DIAGNOSIS — N2581 Secondary hyperparathyroidism of renal origin: Secondary | ICD-10-CM | POA: Diagnosis present

## 2014-10-16 DIAGNOSIS — D6959 Other secondary thrombocytopenia: Secondary | ICD-10-CM | POA: Diagnosis present

## 2014-10-16 DIAGNOSIS — M1990 Unspecified osteoarthritis, unspecified site: Secondary | ICD-10-CM | POA: Diagnosis present

## 2014-10-16 DIAGNOSIS — K219 Gastro-esophageal reflux disease without esophagitis: Secondary | ICD-10-CM | POA: Diagnosis present

## 2014-10-16 DIAGNOSIS — R197 Diarrhea, unspecified: Secondary | ICD-10-CM | POA: Diagnosis present

## 2014-10-16 DIAGNOSIS — Z6822 Body mass index (BMI) 22.0-22.9, adult: Secondary | ICD-10-CM | POA: Diagnosis not present

## 2014-10-16 DIAGNOSIS — C9000 Multiple myeloma not having achieved remission: Secondary | ICD-10-CM | POA: Diagnosis present

## 2014-10-16 DIAGNOSIS — D702 Other drug-induced agranulocytosis: Secondary | ICD-10-CM | POA: Diagnosis present

## 2014-10-16 DIAGNOSIS — I120 Hypertensive chronic kidney disease with stage 5 chronic kidney disease or end stage renal disease: Secondary | ICD-10-CM | POA: Diagnosis present

## 2014-10-16 DIAGNOSIS — N186 End stage renal disease: Secondary | ICD-10-CM | POA: Diagnosis present

## 2014-10-16 DIAGNOSIS — J189 Pneumonia, unspecified organism: Secondary | ICD-10-CM | POA: Diagnosis present

## 2014-10-16 DIAGNOSIS — Y95 Nosocomial condition: Secondary | ICD-10-CM | POA: Diagnosis present

## 2014-10-16 DIAGNOSIS — D61818 Other pancytopenia: Secondary | ICD-10-CM | POA: Diagnosis present

## 2014-10-16 DIAGNOSIS — Z90710 Acquired absence of both cervix and uterus: Secondary | ICD-10-CM | POA: Diagnosis not present

## 2014-10-16 DIAGNOSIS — E43 Unspecified severe protein-calorie malnutrition: Secondary | ICD-10-CM | POA: Diagnosis present

## 2014-10-16 DIAGNOSIS — T451X5A Adverse effect of antineoplastic and immunosuppressive drugs, initial encounter: Secondary | ICD-10-CM | POA: Diagnosis present

## 2014-10-16 DIAGNOSIS — E079 Disorder of thyroid, unspecified: Secondary | ICD-10-CM | POA: Diagnosis present

## 2014-10-16 DIAGNOSIS — D630 Anemia in neoplastic disease: Secondary | ICD-10-CM | POA: Diagnosis present

## 2014-10-16 DIAGNOSIS — Z992 Dependence on renal dialysis: Secondary | ICD-10-CM

## 2014-10-16 DIAGNOSIS — R0602 Shortness of breath: Secondary | ICD-10-CM

## 2014-10-16 DIAGNOSIS — Z7901 Long term (current) use of anticoagulants: Secondary | ICD-10-CM

## 2014-10-16 LAB — CBC WITH DIFFERENTIAL/PLATELET
Basophils Absolute: 0 10*3/uL (ref 0.0–0.1)
Basophils Relative: 1 % (ref 0–1)
EOS ABS: 0 10*3/uL (ref 0.0–0.7)
EOS PCT: 3 % (ref 0–5)
HCT: 32 % — ABNORMAL LOW (ref 36.0–46.0)
Hemoglobin: 10.3 g/dL — ABNORMAL LOW (ref 12.0–15.0)
LYMPHS ABS: 0.5 10*3/uL — AB (ref 0.7–4.0)
Lymphocytes Relative: 32 % (ref 12–46)
MCH: 31.9 pg (ref 26.0–34.0)
MCHC: 32.2 g/dL (ref 30.0–36.0)
MCV: 99.1 fL (ref 78.0–100.0)
Monocytes Absolute: 0.1 10*3/uL (ref 0.1–1.0)
Monocytes Relative: 6 % (ref 3–12)
Neutro Abs: 0.9 10*3/uL — ABNORMAL LOW (ref 1.7–7.7)
Neutrophils Relative %: 58 % (ref 43–77)
PLATELETS: 105 10*3/uL — AB (ref 150–400)
RBC: 3.23 MIL/uL — AB (ref 3.87–5.11)
RDW: 16 % — ABNORMAL HIGH (ref 11.5–15.5)
WBC: 1.5 10*3/uL — ABNORMAL LOW (ref 4.0–10.5)

## 2014-10-16 LAB — BASIC METABOLIC PANEL
Anion gap: 13 (ref 5–15)
BUN: 71 mg/dL — ABNORMAL HIGH (ref 6–23)
CO2: 24 mmol/L (ref 19–32)
Calcium: 7.9 mg/dL — ABNORMAL LOW (ref 8.4–10.5)
Chloride: 94 mEq/L — ABNORMAL LOW (ref 96–112)
Creatinine, Ser: 13.73 mg/dL — ABNORMAL HIGH (ref 0.50–1.10)
GFR calc Af Amer: 3 mL/min — ABNORMAL LOW (ref >90)
GFR calc non Af Amer: 2 mL/min — ABNORMAL LOW (ref >90)
GLUCOSE: 92 mg/dL (ref 70–99)
Potassium: 4.9 mmol/L (ref 3.5–5.1)
SODIUM: 131 mmol/L — AB (ref 135–145)

## 2014-10-16 LAB — I-STAT CHEM 8, ED
BUN: 68 mg/dL — ABNORMAL HIGH (ref 6–23)
CREATININE: 13.7 mg/dL — AB (ref 0.50–1.10)
Calcium, Ion: 0.92 mmol/L — ABNORMAL LOW (ref 1.13–1.30)
Chloride: 95 mEq/L — ABNORMAL LOW (ref 96–112)
Glucose, Bld: 80 mg/dL (ref 70–99)
HCT: 35 % — ABNORMAL LOW (ref 36.0–46.0)
Hemoglobin: 11.9 g/dL — ABNORMAL LOW (ref 12.0–15.0)
Potassium: 5.1 mmol/L (ref 3.5–5.1)
Sodium: 130 mmol/L — ABNORMAL LOW (ref 135–145)
TCO2: 21 mmol/L (ref 0–100)

## 2014-10-16 LAB — PHOSPHORUS: PHOSPHORUS: 8.5 mg/dL — AB (ref 2.3–4.6)

## 2014-10-16 LAB — I-STAT TROPONIN, ED: TROPONIN I, POC: 0.14 ng/mL — AB (ref 0.00–0.08)

## 2014-10-16 LAB — TROPONIN I: Troponin I: 0.12 ng/mL — ABNORMAL HIGH (ref <0.031)

## 2014-10-16 MED ORDER — VANCOMYCIN HCL 10 G IV SOLR
1500.0000 mg | Freq: Once | INTRAVENOUS | Status: DC
Start: 2014-10-16 — End: 2014-10-17
  Filled 2014-10-16: qty 1500

## 2014-10-16 MED ORDER — HEPARIN SODIUM (PORCINE) 5000 UNIT/ML IJ SOLN
5000.0000 [IU] | Freq: Three times a day (TID) | INTRAMUSCULAR | Status: DC
Start: 2014-10-16 — End: 2014-10-17
  Administered 2014-10-16 – 2014-10-17 (×2): 5000 [IU] via SUBCUTANEOUS
  Filled 2014-10-16 (×6): qty 1

## 2014-10-16 MED ORDER — HYDRALAZINE HCL 50 MG PO TABS
100.0000 mg | ORAL_TABLET | Freq: Three times a day (TID) | ORAL | Status: DC
Start: 2014-10-16 — End: 2014-10-19
  Administered 2014-10-16 – 2014-10-18 (×6): 100 mg via ORAL
  Filled 2014-10-16 (×13): qty 2

## 2014-10-16 MED ORDER — NEPRO/CARBSTEADY PO LIQD
237.0000 mL | ORAL | Status: DC | PRN
  Filled 2014-10-16: qty 237

## 2014-10-16 MED ORDER — DOXERCALCIFEROL 4 MCG/2ML IV SOLN
INTRAVENOUS | Status: AC
  Filled 2014-10-16: qty 4

## 2014-10-16 MED ORDER — IPRATROPIUM BROMIDE 0.02 % IN SOLN
0.5000 mg | Freq: Once | RESPIRATORY_TRACT | Status: AC
  Administered 2014-10-16: 0.5 mg via RESPIRATORY_TRACT
  Filled 2014-10-16: qty 2.5

## 2014-10-16 MED ORDER — TBO-FILGRASTIM 300 MCG/0.5ML ~~LOC~~ SOSY
300.0000 ug | PREFILLED_SYRINGE | Freq: Once | SUBCUTANEOUS | Status: AC
  Administered 2014-10-16: 300 ug via SUBCUTANEOUS
  Filled 2014-10-16: qty 0.5

## 2014-10-16 MED ORDER — ALTEPLASE 2 MG IJ SOLR
2.0000 mg | Freq: Once | INTRAMUSCULAR | Status: DC | PRN
  Filled 2014-10-16: qty 2

## 2014-10-16 MED ORDER — DOXERCALCIFEROL 4 MCG/2ML IV SOLN
7.0000 ug | INTRAVENOUS | Status: DC
  Administered 2014-10-19: 7 ug via INTRAVENOUS
  Filled 2014-10-16: qty 4

## 2014-10-16 MED ORDER — PENTAFLUOROPROP-TETRAFLUOROETH EX AERO: 1.0000 "application " | INHALATION_SPRAY | CUTANEOUS | Status: DC | PRN

## 2014-10-16 MED ORDER — LIDOCAINE-PRILOCAINE 2.5-2.5 % EX CREA
1.0000 "application " | TOPICAL_CREAM | CUTANEOUS | Status: DC | PRN
  Filled 2014-10-16: qty 5

## 2014-10-16 MED ORDER — LIDOCAINE HCL (PF) 1 % IJ SOLN: 5.0000 mL | INTRAMUSCULAR | Status: DC | PRN

## 2014-10-16 MED ORDER — SODIUM CHLORIDE 0.9 % IV SOLN: 100.0000 mL | INTRAVENOUS | Status: DC | PRN

## 2014-10-16 MED ORDER — LORAZEPAM 0.5 MG PO TABS: 0.5000 mg | ORAL_TABLET | Freq: Four times a day (QID) | ORAL | Status: DC | PRN

## 2014-10-16 MED ORDER — DEXTROSE 5 % IV SOLN
1.0000 g | Freq: Once | INTRAVENOUS | Status: AC
  Administered 2014-10-16: 1 g via INTRAVENOUS
  Filled 2014-10-16: qty 1

## 2014-10-16 MED ORDER — DOXERCALCIFEROL 4 MCG/2ML IV SOLN: 7.0000 ug | INTRAVENOUS | Status: DC

## 2014-10-16 MED ORDER — ALBUTEROL SULFATE (2.5 MG/3ML) 0.083% IN NEBU
5.0000 mg | INHALATION_SOLUTION | Freq: Once | RESPIRATORY_TRACT | Status: AC
  Administered 2014-10-16: 5 mg via RESPIRATORY_TRACT
  Filled 2014-10-16: qty 6

## 2014-10-16 MED ORDER — HYDROCODONE-HOMATROPINE 5-1.5 MG/5ML PO SYRP: 5.0000 mL | ORAL_SOLUTION | Freq: Four times a day (QID) | ORAL | Status: DC | PRN

## 2014-10-16 MED ORDER — ISOSORBIDE MONONITRATE ER 60 MG PO TB24
60.0000 mg | ORAL_TABLET | Freq: Every day | ORAL | Status: DC
  Administered 2014-10-17 – 2014-10-19 (×3): 60 mg via ORAL
  Filled 2014-10-16 (×3): qty 1

## 2014-10-16 MED ORDER — ACETAMINOPHEN 325 MG PO TABS
ORAL_TABLET | ORAL | Status: AC
  Administered 2014-10-16: 650 mg
  Filled 2014-10-16: qty 2

## 2014-10-16 MED ORDER — HEPARIN SODIUM (PORCINE) 1000 UNIT/ML DIALYSIS: 1000.0000 [IU] | INTRAMUSCULAR | Status: DC | PRN

## 2014-10-16 MED ORDER — ACETAMINOPHEN 325 MG PO TABS: 650.0000 mg | ORAL_TABLET | Freq: Once | ORAL | Status: DC

## 2014-10-16 NOTE — ED Notes (Signed)
Pt in from home via [facility], pt c/o diarrhea onset x 2 days ago, with no relief with Immodium, reports not receiving HD Thurs or today, pt A&O x4, follows commands, speaks in complete sentences, pt reports receiving chemo unknown last tx, next tx 10/18/14, denies CP, SOB, n/v

## 2014-10-16 NOTE — Consult Note (Signed)
Renal Service Consult Note [facility]  [name] 10/16/2014 [place] Requesting Physician:  Dr Daryll Drown   Reason for Consult:  ESRD patient with PNA HPI: The patient is a 74 y.o. year-old with hx of HTN, ESRD and multiple myeloma presented to ED today with diarrhea, coughing and chest soreness from coughing. CXR showed RUL infiltrate and enlargement of the cardiac silhouette.  Pt reports non prod cough, no abd pain, n/v.  No jt pain, rash, HA, dysuria.  Missed HD on Thurs two days ago and did not get HD today either.         Chart review: 6/11 - 03/17/14 > severe anemia, ESRD on HD, hx myeloma > rec'd 3u prbc, Hb 7.6 at DC, FOBT +, no active bleeding.  Cause of pancytopenia felt to be MM.  Not on treatment at this time.  F/U Dr Marin Olp as OP.  Underwent BM biopsy during stay.     Past Medical History  Past Medical History  Diagnosis Date  . Renal disorder   . ESRD (end stage renal disease)   . Hypertension   . Multiple myeloma 2009  . Thyroid disease   . Hyperparathyroidism   . Thrombocytopenia   . Anemia   . Shortness of breath   . Pneumonia   . GERD (gastroesophageal reflux disease)   . Arthritis     rt knee is stiff   Past Surgical History  Past Surgical History  Procedure Laterality Date  . Abdominal hysterectomy      Pt. denies   Family History  Family History  Problem Relation Age of Onset  . Hypertension Mother   . Hypertension Father    Social History  reports that she has never smoked. She has never used smokeless tobacco. She reports that she does not drink alcohol or use illicit drugs. Allergies No Known Allergies Home medications Prior to Admission medications   Medication Sig Start Date End Date Taking? Authorizing Provider  hydrALAZINE (APRESOLINE) 100 MG tablet Take 100 mg by mouth every 8 (eight) hours.   Yes Historical Provider, MD  isosorbide mononitrate (IMDUR) 60 MG 24 hr tablet Take 60 mg by mouth daily.   Yes Historical  Provider, MD  lenalidomide (REVLIMID) 2.5 MG capsule Take 1 capsule (2.5 mg total) by mouth daily. 10/06/14  Yes Heath Lark, MD  vitamin C (ASCORBIC ACID) 500 MG tablet Take 500 mg by mouth daily.   Yes Historical Provider, MD  acyclovir (ZOVIRAX) 400 MG tablet Take 1 tablet (400 mg total) by mouth daily. Patient not taking: Reported on 10/16/2014 03/29/14   Concha Norway, MD  dexamethasone (DECADRON) 4 MG tablet Take 3 tablets (12 mg) on days 1,8,15, and 22 of chemotherapy. Patient not taking: Reported on 10/16/2014 06/14/14   Aasim Marla Roe, MD  furosemide (LASIX) 20 MG tablet Take 1 tablet (20 mg total) by mouth daily. PRN for leg edema Patient not taking: Reported on 10/16/2014 06/28/14   Aasim Marla Roe, MD  HYDROcodone-homatropine [facility]) 5-1.5 MG/5ML syrup Take 5 mLs by mouth every 6 (six) hours as needed for cough. Patient not taking: Reported on 10/16/2014 05/03/14   Carlton Adam, PA-C  LORazepam (ATIVAN) 0.5 MG tablet Take 1 tablet (0.5 mg total) by mouth every 6 (six) hours as needed (Nausea or vomiting). Patient not taking: Reported on 10/16/2014 03/25/14   Concha Norway, MD  ondansetron (ZOFRAN) 8 MG tablet Take 1 tablet (8 mg total) by mouth 2 (two) times daily. Start the day after  chemo for 2 days. Then as needed for nausea or vomiting. Patient not taking: Reported on 10/16/2014 03/25/14   Concha Norway, MD  prochlorperazine (COMPAZINE) 10 MG tablet Take 1 tablet (10 mg total) by mouth every 6 (six) hours as needed (Nausea or vomiting). Patient not taking: Reported on 10/16/2014 03/25/14   Concha Norway, MD   Liver Function Tests No results for input(s): AST, ALT, ALKPHOS, BILITOT, PROT, ALBUMIN in the last 168 hours. No results for input(s): LIPASE, AMYLASE in the last 168 hours. CBC  Recent Labs Lab 10/16/14 1200 10/16/14 1348  WBC 1.5*  --   NEUTROABS 0.9*  --   HGB 10.3* 11.9*  HCT 32.0* 35.0*  MCV 99.1  --   PLT 105*  --    Basic Metabolic Panel  Recent Labs Lab  10/16/14 1348 10/16/14 1446  NA 130* 131*  K 5.1 4.9  CL 95* 94*  CO2  --  24  GLUCOSE 80 92  BUN 68* 71*  CREATININE 13.70* 13.73*  CALCIUM  --  7.9*    Filed Vitals:   10/16/14 1400 10/16/14 1515 10/16/14 1559 10/16/14 1630  BP: 123/76  145/83 174/86  Pulse: 71 65 68 68  Temp:   101.2 F (38.4 C)   TempSrc:   Oral   Resp: _0 SpO2: 90% 96% 100%    Exam Older adult female, coughing and wheezing, not in distress Temp 101.2, BP 170/85 No rash, cyanosis or gangrene Sclera anicteric, throat clear +JVD Chest diffusely decreased BS, diffuse exp wheezing, scattered rale bilat RRR no MRG Abd soft, NTND, no ascites, mass or HSM No LE edema or UE edema Neuro is alert, Ox 3, nf LUA AVF patent  HD: TTS  3h 37mn   2/2.25 Bath  60.5kg   AVF LUA   Heparin none Aranesp 120 ug / Tues   Hectorol 7 ug  Na 131, K 4.9, Cr 13, CO2 24 WBC 1.5  Hb 11.9   plt 105k CXR RUL infiltrate, vs edema   Assessment: 1. Cough / pulm infiltrate / fever 101 - probable PNA 2. ESRD on HD 3. HTN/ vol - BP up, 3kg up by wts, +some vol excess on exam. Takes hydralazine 100 tid at home 4. Anemia on aranesp 5. MBD cont vit D, check phos 6. Multiple myeloma - on chemo reportedly   Plan- abx, HD, UF max as tolerated today  RKelly SplinterMD (pgr) [phone]   [phone] [id number]/16/2016, 5:24 PM

## 2014-10-16 NOTE — ED Provider Notes (Signed)
CSN: [id number]     Arrival date & time 10/16/14  1116 History   First MD Initiated Contact with Patient 10/16/14 1152     Chief Complaint  Patient presents with  . Diarrhea     (Consider location/radiation/quality/duration/timing/severity/associated sxs/prior Treatment) HPI Comments: Patient presents emergency department with ESRD, hypertension, multiple myeloma, thrombocytopenia, presents to the emergency room complaining of shortness of breath, cough, and diarrhea 2 days. She normally gets dialysis on Tuesday, Thursday, and Saturday. She states that she has not had dialysis since last Tuesday. She denies any associated fever, chills, nausea, or vomiting. She has tried taking Imodium with now relief. There are no aggravating or relieving factors.  She is currently getting chemotherapy at the cancer center, but does not have a primary care physician.  The history is provided by the patient. No language interpreter was used.    Past Medical History  Diagnosis Date  . Renal disorder   . ESRD (end stage renal disease)   . Hypertension   . Multiple myeloma 2009  . Thyroid disease   . Hyperparathyroidism   . Thrombocytopenia   . Anemia   . Shortness of breath   . Pneumonia   . GERD (gastroesophageal reflux disease)   . Arthritis     rt knee is stiff   Past Surgical History  Procedure Laterality Date  . Abdominal hysterectomy      Pt. denies   Family History  Problem Relation Age of Onset  . Hypertension Mother   . Hypertension Father    History  Substance Use Topics  . Smoking status: Never Smoker   . Smokeless tobacco: Never Used  . Alcohol Use: No   OB History    No data available     Review of Systems  Constitutional: Negative for fever and chills.  Respiratory: Positive for shortness of breath.   Cardiovascular: Negative for chest pain.  Gastrointestinal: Negative for nausea, vomiting, diarrhea and constipation.  Genitourinary: Negative for dysuria.  All  other systems reviewed and are negative.     Allergies  Review of patient's allergies indicates no known allergies.  Home Medications   Prior to Admission medications   Medication Sig Start Date End Date Taking? Authorizing Provider  acetaminophen-codeine (TYLENOL #3) 300-30 MG per tablet Take by mouth every 4 (four) hours as needed for moderate pain.    Historical Provider, MD  acyclovir (ZOVIRAX) 400 MG tablet Take 1 tablet (400 mg total) by mouth daily. 03/29/14   Concha Norway, MD  amLODipine (NORVASC) 10 MG tablet Take 10 mg by mouth daily.  03/02/14   Historical Provider, MD  aspirin EC 81 MG tablet Take 81 mg by mouth daily. 07/07/14   Historical Provider, MD  cyproheptadine (PERIACTIN) 4 MG tablet Take 4 mg by mouth 2 (two) times daily.    Historical Provider, MD  dexamethasone (DECADRON) 4 MG tablet Take 3 tablets (12 mg) on days 1,8,15, and 22 of chemotherapy. 06/14/14   Aasim Marla Roe, MD  furosemide (LASIX) 20 MG tablet Take 1 tablet (20 mg total) by mouth daily. PRN for leg edema 06/28/14   Aasim Marla Roe, MD  hydrALAZINE (APRESOLINE) 100 MG tablet Take 100 mg by mouth every 8 (eight) hours.    Historical Provider, MD  HYDROcodone-homatropine (HYCODAN) 5-1.5 MG/5ML syrup Take 5 mLs by mouth every 6 (six) hours as needed for cough. 05/03/14   Carlton Adam, PA-C  isosorbide mononitrate (IMDUR) 60 MG 24 hr tablet Take 60 mg by mouth  daily.    Historical Provider, MD  lenalidomide (REVLIMID) 2.5 MG capsule Take 1 capsule (2.5 mg total) by mouth daily. 10/06/14   Heath Lark, MD  LORazepam (ATIVAN) 0.5 MG tablet Take 1 tablet (0.5 mg total) by mouth every 6 (six) hours as needed (Nausea or vomiting). 03/25/14   Concha Norway, MD  metolazone (ZAROXOLYN) 5 MG tablet Take 5 mg by mouth 2 (two) times daily.    Historical Provider, MD  Multiple Vitamins-Iron (DAILY-VITE/IRON PO) Take 1 tablet by mouth daily.    Historical Provider, MD  ondansetron (ZOFRAN) 8 MG tablet Take 1 tablet (8 mg  total) by mouth 2 (two) times daily. Start the day after chemo for 2 days. Then as needed for nausea or vomiting. 03/25/14   Concha Norway, MD  prochlorperazine (COMPAZINE) 10 MG tablet Take 1 tablet (10 mg total) by mouth every 6 (six) hours as needed (Nausea or vomiting). 03/25/14   Concha Norway, MD  sevelamer carbonate (RENVELA) 800 MG tablet Take 800 mg by mouth 3 (three) times daily with meals.    Historical Provider, MD   BP 143/74 mmHg  Pulse 59  Temp(Src) 99.8 F (37.7 C) (Oral)  Resp 17  SpO2 93% Physical Exam  Constitutional: She is oriented to person, place, and time. She appears well-developed and well-nourished.  HENT:  Head: Normocephalic and atraumatic.  Eyes: Conjunctivae and EOM are normal. Pupils are equal, round, and reactive to light.  Neck: Normal range of motion. Neck supple.  Cardiovascular: Normal rate and regular rhythm.  Exam reveals no gallop and no friction rub.   No murmur heard. Palpable thrill to left AV fistula  Pulmonary/Chest: Effort normal. No respiratory distress. She has wheezes. She has rales. She exhibits no tenderness.  Bilateral rales and some wheezing  Abdominal: Soft. Bowel sounds are normal. She exhibits no distension and no mass. There is no tenderness. There is no rebound and no guarding.  Musculoskeletal: Normal range of motion. She exhibits no edema or tenderness.  Neurological: She is alert and oriented to person, place, and time.  Skin: Skin is warm and dry.  Psychiatric: She has a normal mood and affect. Her behavior is normal. Judgment and thought content normal.  Nursing note and vitals reviewed.   ED Course  Procedures (including critical care time) Results for orders placed or performed during the hospital encounter of 10/16/14  CBC with Differential  Result Value Ref Range   WBC 1.5 (L) 4.0 - 10.5 K/uL   RBC 3.23 (L) 3.87 - 5.11 MIL/uL   Hemoglobin 10.3 (L) 12.0 - 15.0 g/dL   HCT 32.0 (L) 36.0 - 46.0 %   MCV 99.1 78.0 - 100.0 fL    MCH 31.9 26.0 - 34.0 pg   MCHC 32.2 30.0 - 36.0 g/dL   RDW 16.0 (H) 11.5 - 15.5 %   Platelets 105 (L) 150 - 400 K/uL   Neutrophils Relative % 58 43 - 77 %   Lymphocytes Relative 32 12 - 46 %   Monocytes Relative 6 3 - 12 %   Eosinophils Relative 3 0 - 5 %   Basophils Relative 1 0 - 1 %   Neutro Abs 0.9 (L) 1.7 - 7.7 K/uL   Lymphs Abs 0.5 (L) 0.7 - 4.0 K/uL   Monocytes Absolute 0.1 0.1 - 1.0 K/uL   Eosinophils Absolute 0.0 0.0 - 0.7 K/uL   Basophils Absolute 0.0 0.0 - 0.1 K/uL   RBC Morphology POLYCHROMASIA PRESENT   I-stat chem 8,  ed  Result Value Ref Range   Sodium 130 (L) 135 - 145 mmol/L   Potassium 5.1 3.5 - 5.1 mmol/L   Chloride 95 (L) 96 - 112 mEq/L   BUN 68 (H) 6 - 23 mg/dL   Creatinine, Ser 13.70 (H) 0.50 - 1.10 mg/dL   Glucose, Bld 80 70 - 99 mg/dL   Calcium, Ion 0.92 (L) 1.13 - 1.30 mmol/L   TCO2 21 0 - 100 mmol/L   Hemoglobin 11.9 (L) 12.0 - 15.0 g/dL   HCT 35.0 (L) 36.0 - 46.0 %  I-stat troponin, ED  Result Value Ref Range   Troponin i, poc 0.14 (HH) 0.00 - 0.08 ng/mL   Comment NOTIFIED PHYSICIAN    Comment 3           Dg Chest Port 1 View  10/16/2014   CLINICAL DATA:  74 year old female with progressive shortness of breath and cough over the past 2 days.  EXAM: PORTABLE CHEST - 1 VIEW  COMPARISON:  Prior bone survey including frontal view of the chest 03/12/2014  FINDINGS: The patient is slightly rotated toward the right. Increasing enlargement of the cardiopericardial silhouette. Atherosclerotic and mildly tortuous thoracic aorta. Mild vascular congestion without overt edema. Focal patchy airspace opacity in the right upper lung represents an interval finding. The lungs are hyperinflated. Mild central bronchitic change. Surgical clips are noted in the soft tissues of the medial left upper extremity. No acute osseous abnormality.  IMPRESSION: 1. Focal patchy airspace opacity in the right upper lobe may reflect bronchopneumonia. 2. Progressive enlargement of the  cardiopericardial silhouette which may represent increasing cardiomegaly and/or pericardial effusion. 3. Slightly increased pulmonary vascular congestion without evidence of edema.   Electronically Signed   By: Jacqulynn Cadet M.D.   On: 10/16/2014 12:57       EKG Interpretation None      MDM   Final diagnoses:  SOB (shortness of breath)  HCAP (healthcare-associated pneumonia)    Medications  ceFEPIme (MAXIPIME) 1 g in dextrose 5 % 50 mL IVPB (not administered)  vancomycin (VANCOCIN) 1,500 mg in sodium chloride 0.9 % 500 mL IVPB (not administered)  ipratropium (ATROVENT) nebulizer solution 0.5 mg (0.5 mg Nebulization Given 10/16/14 1247)  albuterol (PROVENTIL) (2.5 MG/3ML) 0.083% nebulizer solution 5 mg (5 mg Nebulization Given 10/16/14 1247)    Chest x-ray remarkable for right patchy pneumonia, patient has low-grade fever of 99.8 in the emergency department, and has had somewhat productive cough.  Only slightly increased pulmonary vascular congestion, with no evidence of edema, doubt fluid overload.  Patient seen by and discussed with Dr. Ralene Bathe, will admit to internal medicine teaching service.    Start Vanc and cefepime.  Cultures pending.  I will discuss with nephrology and arrange for dialysis.  2:35 PM Discussed with Dr. Jonnie Finner from renal, who will arrange for dialysis.  Montine Circle, PA-C 10/16/14 1437  Quintella Reichert, MD 10/16/14 1630

## 2014-10-16 NOTE — ED Notes (Signed)
Attempted report 

## 2014-10-16 NOTE — Progress Notes (Addendum)
New Admission Note  Arrival: from HD via stretcher Mental Orientation: alert and oriented x4 Telemetry: n/a Assessment: See flow sheet. IV: LAC saline locked Safety Measures: Side rails in place, fall risk assessment complete with patient verbalizing understanding of risks associated with falls. Pt verbalizes an understanding of how to use the call bell and to call for help before getting out of bed. Call bell within reach, bed in lowest position, non-skid socks applied. [address] [place] Orientation: Pt orientation to unit, room and routine. Information packet given to patient and safety video watched.  Admission armband ID verified with patient and in place.  Family: n/a  Orders have been reviewed and implemented. Will continue to monitor and assist as needed. Per pt, requests to sleep for tonight. Will do admission questions in AM.   Velora Mediate, RN 10/16/2014 8:48 PM

## 2014-10-16 NOTE — Progress Notes (Addendum)
ANTIBIOTIC CONSULT NOTE - INITIAL  Pharmacy Consult for Vancomycin Indication: PNA  No Known Allergies  Patient Measurements:   TBW: 63 kg  Vital Signs: Temp: 99.8 F (37.7 C) (01/16 1126) Temp Source: Oral (01/16 1126) BP: 143/74 mmHg (01/16 1126) Pulse Rate: 59 (01/16 1300) Intake/Output from previous day:   Intake/Output from this shift:    Labs:  Recent Labs  10/16/14 1200  WBC 1.5*  HGB 10.3*  PLT 105*   CrCl cannot be calculated (Unknown ideal weight.). No results for input(s): VANCOTROUGH, VANCOPEAK, VANCORANDOM, GENTTROUGH, GENTPEAK, GENTRANDOM, TOBRATROUGH, TOBRAPEAK, TOBRARND, AMIKACINPEAK, AMIKACINTROU, AMIKACIN in the last 72 hours.   Microbiology: No results found for this or any previous visit (from the past 720 hour(s)).  Medical History: Past Medical History  Diagnosis Date  . Renal disorder   . ESRD (end stage renal disease)   . Hypertension   . Multiple myeloma 2009  . Thyroid disease   . Hyperparathyroidism   . Thrombocytopenia   . Anemia   . Shortness of breath   . Pneumonia   . GERD (gastroesophageal reflux disease)   . Arthritis     rt knee is stiff   Assessment: 74 yo F presents on 1/16 with diarrhea. CXR found to be remarkable for R patchy infiltrates and pt has a low grade fever. PMH significant for ESRD, HTN, multiple myeloma, thrombocytopenia. Pharmacy to dose vancomycin.  Goal of Therapy:  Eradication of infection  Plan:  Give vancomycin 1559m IV x 1 MD ordered cefepime 1g x 1 Monitor clinical picture, VR prn F/U HD schedule and dosing  BATCHELDER,NATHAN J 10/16/2014,1:40 PM  5:15 PM MD now ordered pharmacy to dose cefepime.  To give with HD, follow up HD schedule and order as indicated.   Thank you for allowing pharmacy to be a part of this patients care team.  JRowe RobertPharm.D., BCPS, AQ-Cardiology Clinical Pharmacist 10/16/2014 5:15 PM Pager: [phone]: [phone]

## 2014-10-16 NOTE — Procedures (Signed)
I was present at this dialysis session, have reviewed the session itself and made  appropriate changes  Kelly Splinter MD (pgr) [phone]    ([phone] 10/16/2014, 5:58 PM

## 2014-10-16 NOTE — H&P (Signed)
 Date: 10/16/2014               Patient Name:  [name] MRN: [id number]  DOB: 10/16/1940 Age / Sex: 73 y.o., female   PCP: No Pcp Per Patient         Medical Service: Internal Medicine Teaching Service         Attending Physician: Dr. Emily B Mullen, MD    First Contact: Dr. Julie  Pager: [phone]  Second Contact: Dr. Woody Jones Pager: [phone]       After Hours (After 5p/  First Contact Pager: [phone]  weekends / holidays): Second Contact Pager: [phone]   Chief Complaint: cough and shortness of breath for 3 days  History of Present Illness: [name] is a 73 yo woman with a history of ESRD (T, R, Sa), multiple myeloma IIIB (diagnosed in 2009, currently on chemotherapy), hypertension, anemia in neoplastic disease and drug-induced neutropenia who presented with a three day history of productive cough and shortness of breath. Her sputum has been white. She also vomited once and has a decreased appetite but normal PO intake. She has had loose BMs for about a year. She denies chills, fever, muscle aches or sick contacts. She did get a flu shot this year. Unfortunately, she missed her Thursday and Saturday hemodialysis sessions because she felt too ill.  She is followed by Dr. Gursuch for her MM. She has been on chemotherapy, Revlemid, dexamethasone and G-CSF for neutropenia.     Current Facility-Administered Medications  Medication Dose Route Frequency Provider Last Rate Last Dose  . 0.9 %  sodium chloride infusion  100 mL Intravenous PRN Charles [name], PA-C      . 0.9 %  sodium chloride infusion  100 mL Intravenous PRN Charles [name], PA-C      . acetaminophen (TYLENOL) 325 MG tablet           . alteplase (CATHFLO ACTIVASE) injection 2 mg  2 mg Intracatheter Once PRN Charles [name], PA-C      . [START ON 10/19/2014] doxercalciferol (HECTOROL) injection 7 mcg  7 mcg Intravenous Q T,Th,Sa-HD Robert D Schertz, MD      . feeding supplement (NEPRO CARB STEADY) liquid 237 mL   237 mL Oral PRN Charles [name], PA-C      . heparin injection 1,000 Units  1,000 Units Dialysis PRN Charles [name], PA-C      . heparin injection 5,000 Units  5,000 Units Subcutaneous 3 times per day Eden W Jones, MD      . hydrALAZINE (APRESOLINE) tablet 100 mg  100 mg Oral 3 times per day Eden W Jones, MD      . [START ON 10/17/2014] isosorbide mononitrate (IMDUR) 24 hr tablet 60 mg  60 mg Oral Daily Eden W Jones, MD      . lidocaine (PF) (XYLOCAINE) 1 % injection 5 mL  5 mL Intradermal PRN Charles [name], PA-C      . lidocaine-prilocaine (EMLA) cream 1 application  1 application Topical PRN Charles [name], PA-C      . LORazepam (ATIVAN) tablet 0.5 mg  0.5 mg Oral Q6H PRN Eden W Jones, MD      . pentafluoroprop-tetrafluoroeth (GEBAUERS) aerosol 1 application  1 application Topical PRN Charles [name], PA-C      . Tbo-Filgrastim (GRANIX) injection 300 mcg  300 mcg Subcutaneous ONCE-1800 Eden W Jones, MD      . vancomycin (VANCOCIN) 1,500 mg in sodium chloride 0.9 % 500 mL IVPB  1,500   mg Intravenous Once Reginia Naas, RPH       Facility-Administered Medications Ordered in Other Encounters  Medication Dose Route Frequency Provider Last Rate Last Dose  . 0.9 %  sodium chloride infusion   Intravenous Once Aasim Marla Roe, MD        Allergies: Allergies as of 10/16/2014  . (No Known Allergies)   Past Medical History  Diagnosis Date  . Renal disorder   . ESRD (end stage renal disease)   . Hypertension   . Multiple myeloma 2009  . Thyroid disease   . Hyperparathyroidism   . Thrombocytopenia   . Anemia   . Shortness of breath   . Pneumonia   . GERD (gastroesophageal reflux disease)   . Arthritis     rt knee is stiff   Past Surgical History  Procedure Laterality Date  . Abdominal hysterectomy      Pt. denies   Family History  Problem Relation Age of Onset  . Hypertension Mother   . Hypertension Father    History   Social History  . Marital Status: Married    Spouse  Name: N/A    Number of Children: 3  . Years of Education: N/A   Occupational History  . Retired      Pharmacist, hospital asst.   Social History Main Topics  . Smoking status: Never Smoker   . Smokeless tobacco: Never Used  . Alcohol Use: No  . Drug Use: No  . Sexual Activity: No   Other Topics Concern  . Not on file   Social History Narrative   Patient recently moved to [place] from [place] in May 2015.   [name] was born in [place], [place].   Patient has brother in [place] area.    Review of Systems: General: felt ill for the last 3 days with productive cough Skin: no rashes or lesions HEENT: no headaches, no changes in vision Cardiac: no chest pain, no palpitations Respiratory: + shortness of breath GI: loose BMs for the past year Urinary: no dysuria or hematuria Msk: no aches Psychiatric: no history of depression or anxiety  Physical Exam: Blood pressure 116/80, pulse 74, temperature 99.8 F (37.7 C), temperature source Oral, resp. rate 20, weight 0 lb (0 kg), SpO2 100 %. Appearance: in NAD in HD bed, occasionally coughing HEENT: AT/Wise, PERRL, EOMi, no ocular discharge, sounds congested, MMM Heart: RRR, normal S1S2, no MRG, +JVD Lungs: Decreased breath sounds, diffuse expiratory wheezing, occasional rales Abdomen: BS+, soft, nontender to palpation Musculoskeletal: no LE edema b/l, left access in use Neurologic: A&Ox3, grossly intact Skin: no rashes or lesions  Lab results: Basic Metabolic Panel:  Recent Labs  10/16/14 1348 10/16/14 1446  NA 130* 131*  K 5.1 4.9  CL 95* 94*  CO2  --  24  GLUCOSE 80 92  BUN 68* 71*  CREATININE 13.70* 13.73*  CALCIUM  --  7.9*   CBC:  Recent Labs  10/16/14 1200 10/16/14 1348  WBC 1.5*  --   NEUTROABS 0.9*  --   HGB 10.3* 11.9*  HCT 32.0* 35.0*  MCV 99.1  --   PLT 105*  --    Cardiac Enzymes:  Recent Labs  10/16/14 1700  TROPONINI 0.12*   Imaging results:  Dg Chest Port 1 View  10/16/2014   CLINICAL DATA:   74 year old female with progressive shortness of breath and cough over the past 2 days.  EXAM: PORTABLE CHEST - 1 VIEW  COMPARISON:  Prior bone survey  including frontal view of the chest 03/12/2014  FINDINGS: The patient is slightly rotated toward the right. Increasing enlargement of the cardiopericardial silhouette. Atherosclerotic and mildly tortuous thoracic aorta. Mild vascular congestion without overt edema. Focal patchy airspace opacity in the right upper lung represents an interval finding. The lungs are hyperinflated. Mild central bronchitic change. Surgical clips are noted in the soft tissues of the medial left upper extremity. No acute osseous abnormality.  IMPRESSION: 1. Focal patchy airspace opacity in the right upper lobe may reflect bronchopneumonia. 2. Progressive enlargement of the cardiopericardial silhouette which may represent increasing cardiomegaly and/or pericardial effusion. 3. Slightly increased pulmonary vascular congestion without evidence of edema.   Electronically Signed   By: Jacqulynn Cadet M.D.   On: 10/16/2014 12:57    Other results: EKG: no ekg  Assessment & Plan by Problem: Active Problems:   HCAP (healthcare-associated pneumonia)  [name] is a 74 yo woman admitted for HCAP treatment. Her recent symptoms of URI caused her to also miss HD, but her exam is not convincing for volume overload. She received HD shortly after admission.  HCAP: Patient w/ RML infiltrate on CXR and recent h/o cough and SOB. No clear evidence of volume overload despite missing one HD session. Started on Vancomycin + Cefepime in ED.  - Continue cefepime per pharmacy - Vancomycin per pharmacy - Atrovent nebs - Flu panel with droplet isolation - BMP and CBC with diff in am - O2 PRN for O2 sat >92%  ESRD: Receives HD T/R/Sa. Missed on R, completed in hospital today. - Appreciate renal consult  Slightly Elevated Troponin: 0.12 Likely secondary to ESRD. - EKG in am - Trend  troponins  Multiple Myeloma IIIB: Diagnosed in 2009 and on chemotherapy. Follows w/ [name], who will visit patient if still admitted Monday. Has been on chemo + Revlemid + Dexamethasone. Has required G-CSF for neutropenia, given every Friday. Discussed w/ Dr. Lindi Adie (onc), will hold MM medications and give Granix (at 69mg/kg dose) until [place]>1.0. - Hold Revlimid and dexamethasone - Granix 300 mcg per onc; need to reorder based on diff - Missed G-CSF yesterday, to be given during this hospitalization  Hypertension: Currently 116/80 but peaked at 178/92 today. - Continue home hydralazine 100 mg TID - Continue home Imdur 60 mg daily  Anemia in Neoplastic Disease: Hemoglobin 11.9. Asymptomatic. Does not require transfusion or erythropoietin stimulating agent now per recent oncology note.  Drug-Induced Neutropenia: WBC 1.5. Baseline appears to be ~2.2. Likely due to recent treatment and has been present on recent exam. Asymptomatic. To continue chemotherapy without adjustment per recent onc note. May have to delay therapy in future if continues to drop.  History of Thyroid Disease:  - TSH pending  Diet:  - Renal  DVT Ppx: - [place] heparin  Dispo: Disposition is deferred at this time, awaiting improvement of current medical problems. Anticipated discharge in approximately 2 day(s).   The patient does not have a current PCP (No Pcp Per Patient) and [facility].  The patient does not have transportation limitations that hinder transportation to clinic appointments.  Signed: JDrucilla Schmidt MD 10/16/2014, 7:43 PM

## 2014-10-16 NOTE — ED Notes (Signed)
Verdene Lennert, RN in HD for pt to receive HD prior to going to the floor

## 2014-10-17 ENCOUNTER — Encounter (HOSPITAL_COMMUNITY): Payer: Self-pay

## 2014-10-17 DIAGNOSIS — R197 Diarrhea, unspecified: Secondary | ICD-10-CM

## 2014-10-17 DIAGNOSIS — Z992 Dependence on renal dialysis: Secondary | ICD-10-CM

## 2014-10-17 DIAGNOSIS — J189 Pneumonia, unspecified organism: Principal | ICD-10-CM

## 2014-10-17 DIAGNOSIS — I120 Hypertensive chronic kidney disease with stage 5 chronic kidney disease or end stage renal disease: Secondary | ICD-10-CM

## 2014-10-17 DIAGNOSIS — Z8639 Personal history of other endocrine, nutritional and metabolic disease: Secondary | ICD-10-CM

## 2014-10-17 DIAGNOSIS — D702 Other drug-induced agranulocytosis: Secondary | ICD-10-CM

## 2014-10-17 DIAGNOSIS — N186 End stage renal disease: Secondary | ICD-10-CM

## 2014-10-17 DIAGNOSIS — Y95 Nosocomial condition: Secondary | ICD-10-CM

## 2014-10-17 DIAGNOSIS — D630 Anemia in neoplastic disease: Secondary | ICD-10-CM

## 2014-10-17 DIAGNOSIS — C9000 Multiple myeloma not having achieved remission: Secondary | ICD-10-CM

## 2014-10-17 LAB — CBC WITH DIFFERENTIAL/PLATELET
BASOS PCT: 1 % (ref 0–1)
Basophils Absolute: 0 10*3/uL (ref 0.0–0.1)
EOS ABS: 0.1 10*3/uL (ref 0.0–0.7)
Eosinophils Relative: 8 % — ABNORMAL HIGH (ref 0–5)
HCT: 32.4 % — ABNORMAL LOW (ref 36.0–46.0)
Hemoglobin: 10.1 g/dL — ABNORMAL LOW (ref 12.0–15.0)
Lymphocytes Relative: 21 % (ref 12–46)
Lymphs Abs: 0.3 10*3/uL — ABNORMAL LOW (ref 0.7–4.0)
MCH: 31.3 pg (ref 26.0–34.0)
MCHC: 31.2 g/dL (ref 30.0–36.0)
MCV: 100.3 fL — ABNORMAL HIGH (ref 78.0–100.0)
MONO ABS: 0.2 10*3/uL (ref 0.1–1.0)
MONOS PCT: 12 % (ref 3–12)
NEUTROS PCT: 58 % (ref 43–77)
Neutro Abs: 0.9 10*3/uL — ABNORMAL LOW (ref 1.7–7.7)
Platelets: 87 10*3/uL — ABNORMAL LOW (ref 150–400)
RBC: 3.23 MIL/uL — ABNORMAL LOW (ref 3.87–5.11)
RDW: 16 % — ABNORMAL HIGH (ref 11.5–15.5)
WBC: 1.5 10*3/uL — AB (ref 4.0–10.5)

## 2014-10-17 LAB — EXPECTORATED SPUTUM ASSESSMENT W REFEX TO RESP CULTURE

## 2014-10-17 LAB — BASIC METABOLIC PANEL
Anion gap: 10 (ref 5–15)
BUN: 23 mg/dL (ref 6–23)
CALCIUM: 8.2 mg/dL — AB (ref 8.4–10.5)
CO2: 26 mmol/L (ref 19–32)
CREATININE: 6.65 mg/dL — AB (ref 0.50–1.10)
Chloride: 98 mEq/L (ref 96–112)
GFR calc Af Amer: 6 mL/min — ABNORMAL LOW (ref >90)
GFR, EST NON AFRICAN AMERICAN: 5 mL/min — AB (ref >90)
Glucose, Bld: 94 mg/dL (ref 70–99)
Potassium: 4 mmol/L (ref 3.5–5.1)
Sodium: 134 mmol/L — ABNORMAL LOW (ref 135–145)

## 2014-10-17 LAB — TSH: TSH: 0.954 u[IU]/mL (ref 0.350–4.500)

## 2014-10-17 LAB — INFLUENZA PANEL BY PCR (TYPE A & B)
H1N1FLUPCR: NOT DETECTED
INFLBPCR: NEGATIVE
Influenza A By PCR: NEGATIVE

## 2014-10-17 LAB — TROPONIN I: TROPONIN I: 0.13 ng/mL — AB (ref <0.031)

## 2014-10-17 LAB — CLOSTRIDIUM DIFFICILE BY PCR: CDIFFPCR: NEGATIVE

## 2014-10-17 LAB — EXPECTORATED SPUTUM ASSESSMENT W GRAM STAIN, RFLX TO RESP C

## 2014-10-17 MED ORDER — IPRATROPIUM BROMIDE 0.02 % IN SOLN: 0.5000 mg | RESPIRATORY_TRACT | Status: DC | PRN

## 2014-10-17 MED ORDER — TBO-FILGRASTIM 300 MCG/0.5ML ~~LOC~~ SOSY
300.0000 ug | PREFILLED_SYRINGE | Freq: Once | SUBCUTANEOUS | Status: DC
  Filled 2014-10-17: qty 0.5

## 2014-10-17 MED ORDER — CALCIUM ACETATE 667 MG PO CAPS
667.0000 mg | ORAL_CAPSULE | Freq: Three times a day (TID) | ORAL | Status: DC
  Administered 2014-10-18 – 2014-10-19 (×5): 667 mg via ORAL
  Filled 2014-10-17 (×6): qty 1

## 2014-10-17 MED ORDER — TBO-FILGRASTIM 300 MCG/0.5ML ~~LOC~~ SOSY
300.0000 ug | PREFILLED_SYRINGE | Freq: Every day | SUBCUTANEOUS | Status: DC
  Administered 2014-10-17: 300 ug via SUBCUTANEOUS
  Filled 2014-10-17 (×2): qty 0.5

## 2014-10-17 NOTE — Progress Notes (Addendum)
Subjective: [name] feels "much better!" today. She still can hear herself wheezing, but she no longer is having shortness of breath. She is concerned about her loose BMs. These have been going on for over a year, and she is pleased that she was able to provide a stool sample this morning to investigate the problem.  Objective: Vital signs in last 24 hours: Filed Vitals:   10/16/14 2000 10/16/14 2045 10/17/14 0420 10/17/14 0743  BP: 129/63 134/77 140/76 148/74  Pulse: 66 66 103 61  Temp: 99.6 F (37.6 C) 99.7 F (37.6 C) 100.2 F (37.9 C) 100.7 F (38.2 C)  TempSrc: Oral Oral Oral Oral  Resp: _0 Weight: 126 lb 12.2 oz (57.5 kg)     SpO2:  95% 95% 94%   Weight change:   Intake/Output Summary (Last 24 hours) at 10/17/14 1024 Last data filed at 10/16/14 2000  Gross per 24 hour  Intake      0 ml  Output   4000 ml  Net  -4000 ml   Physical Exam: Appearance: in NAD in bed, no coughing during exam today HEENT: AT/[place], PERRL, EOMi, no ocular discharge, sounds congested, MMM Heart: RRR, normal S1S2, no MRG, +JVD Lungs: Decreased breath sounds, diffuse expiratory wheezing, no rales Abdomen: BS+, soft, nontender to palpation Musculoskeletal: no LE edema b/l, left access Neurologic: A&Ox3, grossly intact Skin: no rashes or lesions  Lab Results: Basic Metabolic Panel:  Recent Labs Lab 10/16/14 1446 10/16/14 1700 10/17/14 0257  NA 131*  --  134*  K 4.9  --  4.0  CL 94*  --  98  CO2 24  --  26  GLUCOSE 92  --  94  BUN 71*  --  23  CREATININE 13.73*  --  6.65*  CALCIUM 7.9*  --  8.2*  PHOS  --  8.5*  --    CBC:  Recent Labs Lab 10/16/14 1200 10/16/14 1348 10/17/14 0257  WBC 1.5*  --  1.5*  NEUTROABS 0.9*  --  0.9*  HGB 10.3* 11.9* 10.1*  HCT 32.0* 35.0* 32.4*  MCV 99.1  --  100.3*  PLT 105*  --  87*   Cardiac Enzymes:  Recent Labs Lab 10/16/14 1700 10/17/14 0251  TROPONINI 0.12* 0.13*   Thyroid Function Tests:  Recent Labs Lab  10/17/14 0251  TSH 0.954   Micro Results: Recent Results (from the past 240 hour(s))  Culture, blood (routine x 2)      Status: None (Preliminary result)   Collection Time: 10/16/14  1:37 PM  Result Value Ref Range Status   Specimen Description BLOOD RIGHT ARM  Final   Special Requests BOTTLES DRAWN AEROBIC AND ANAEROBIC 4CC  Final   Culture   Final           BLOOD CULTURE RECEIVED NO GROWTH TO DATE CULTURE WILL BE HELD FOR 5 DAYS BEFORE ISSUING A FINAL NEGATIVE REPORT Performed at [facility]    Report Status PENDING  Incomplete  Culture, blood (routine x 2)      Status: None (Preliminary result)   Collection Time: 10/16/14  1:42 PM  Result Value Ref Range Status   Specimen Description BLOOD RIGHT ARM  Final   Special Requests BOTTLES DRAWN AEROBIC AND ANAEROBIC 5CC  Final   Culture   Final           BLOOD CULTURE RECEIVED NO GROWTH TO DATE CULTURE WILL BE HELD FOR 5 DAYS BEFORE ISSUING A  FINAL NEGATIVE REPORT Performed at [facility]    Report Status PENDING  Incomplete   Studies/Results: Dg Chest Port 1 View  10/16/2014   CLINICAL DATA:  74 year old female with progressive shortness of breath and cough over the past 2 days.  EXAM: PORTABLE CHEST - 1 VIEW  COMPARISON:  Prior bone survey including frontal view of the chest 03/12/2014  FINDINGS: The patient is slightly rotated toward the right. Increasing enlargement of the cardiopericardial silhouette. Atherosclerotic and mildly tortuous thoracic aorta. Mild vascular congestion without overt edema. Focal patchy airspace opacity in the right upper lung represents an interval finding. The lungs are hyperinflated. Mild central bronchitic change. Surgical clips are noted in the soft tissues of the medial left upper extremity. No acute osseous abnormality.  IMPRESSION: 1. Focal patchy airspace opacity in the right upper lobe may reflect bronchopneumonia. 2. Progressive enlargement of the cardiopericardial silhouette which  may represent increasing cardiomegaly and/or pericardial effusion. 3. Slightly increased pulmonary vascular congestion without evidence of edema.   Electronically Signed   By: Jacqulynn Cadet M.D.   On: 10/16/2014 12:57   Medications: I have reviewed the patient's current medications. Scheduled Meds: . acetaminophen  650 mg Oral Once  . [START ON 10/19/2014] doxercalciferol  7 mcg Intravenous Q T,Th,Sa-HD  . hydrALAZINE  100 mg Oral 3 times per day  . isosorbide mononitrate  60 mg Oral Daily  . Tbo-filgastrim (GRANIX) SQ  300 mcg Subcutaneous ONCE-1800  . vancomycin  1,500 mg Intravenous Once   Continuous Infusions:  PRN Meds:.LORazepam Assessment/Plan: Active Problems:   HCAP (healthcare-associated pneumonia)  [name] is a 74 yo woman admitted for HCAP treatment. Her recent symptoms of URI caused her to also miss HD, but her exam is not convincing for volume overload. She received HD shortly after admission. She appears to be responding to treatment and feels much better today.  HCAP: Patient w/ RML infiltrate on CXR and recent h/o cough and SOB. No clear evidence of volume overload despite missing one HD session. Started on Vancomycin + Cefepime in ED.  - Continue cefepime per pharmacy - Vancomycin per pharmacy - Atrovent nebs - Sputum and blood cultures pending - Urine antigens pending - Flu panel with droplet isolation, c diff pending - Continue droplet isolation / neutropenia precautions regardless of flu result - BMP and CBC with diff in am - O2 PRN for O2 sat >92%  ESRD: Receives HD T/R/Sa. Missed on R, completed in hospital yesterday. - Appreciate renal consult  Slightly Elevated Troponin: 0.12-->0.13 Likely secondary to ESRD. Patient asymptomatic with normal EKG.  Diarrhea: Patient reports >1 year of loose BMs that have continued in the hospital. It is very possible that Kyprolis and Revlimid are causing her diarrhea (occurs in 33 and 50% of patients on these agents,  respectively). 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 Follows w/ [name], who will visit patient if still admitted Monday. Has been on chemo + Revlemid + Dexamethasone. Has required G-CSF for neutropenia, given every Friday. Discussed w/ Dr. Lindi Adie (onc), will hold MM medications and give Granix (at 42mg/kg dose) until [place]>1.0. ANC has been 0.9 yesterday and today. - Hold Revlimid and dexamethasone - Granix 300 mcg per onc; will repeat dose today (based on ANC<1.0)   - CBC with diff every morning  Hypertension: Currently 148/74. 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 Asymptomatic. Does not require transfusion or erythropoietin stimulating agent now per recent oncology note.  Drug-Induced Neutropenia: WBC 1.5. Baseline appears to be ~2.2. Likely due to recent treatment and has been present on recent exam. Asymptomatic. To continue chemotherapy without adjustment per recent onc note. May have to delay therapy in future if continues to drop.  History of Thyroid Disease:  - TSH negative  Diet:  - Renal  DVT Ppx: - stopped heparin (patient has chronic thrombocytosis) - SCDs  Dispo: Disposition is deferred at this time, awaiting improvement of current medical problems.  Anticipated discharge in approximately 2 day(s).   The patient does not have a current PCP (No Pcp Per Patient) and does need an [facility] hospital follow-up appointment after discharge.  The patient does not have transportation limitations that hinder transportation to clinic appointments.  .Services Needed at time of discharge: Y = Yes, Blank = No PT:   OT:   RN:   Equipment:   Other:     LOS: 1 day   Drucilla Schmidt, MD 10/17/2014, 10:24 AM

## 2014-10-17 NOTE — Progress Notes (Addendum)
  Date: 10/17/2014  Patient name: [name]  Medical record number: [id number]  Date of birth: 1941/01/08   I have seen and evaluated [name] and discussed their care with the Residency Team. Briefly, [name] is a 73yo woman with PMH of ESRD, MM (currently on chemotherapy), HTN, anemia from her neoplastic disease, neutropenia who presents for 3 day history of productive cough and SOB.  White sputum noted.  + decreased PO intake due to decreased appetite.  She has chronic loose bowels for about a year, however, she is now having diarrhea regularly per her.  She reports no abdominal pain, no nausea, vomiting or blood in the stool.  Due to her illness, she has missed her Thursday and Saturday HD this week.  She is currently on Revlemid, dexamethasone and G-CSF for her her MM.  On exam, she is a thin woman in NAD, alert oriented, dry MM, RR, NR, no murmur, + expiratory wheezing, + rales throughout, abdomen is thin, NT, ND, +BS, Ext are thin, no rash  On labs, she was noted to have Na of 131, Cr. Of 13.73, ca of 7.9, WBC of 1.5 ([place] 870), TnI 0.12.  CXR with new patchy airspace disease in RUL.   Assessment and Plan: I have seen and evaluated the patient as outlined above. I agree with the formulated Assessment and Plan as detailed in the residents' admission note, with the following changes:   1. HCAP in the setting of neutorpenia - Agree with Vanc/Cefepime - Monitor WBC and ANC - Continue GCSF medication - Attempt sputum culture for specification of bacteria.  - Nebs as needed - BC X 2 pending.   2. ESRD - Nephrology consult, restart HD  3. Elevated TnI - Trend, she has ESRD and no chest pain  4. MM IIIB - The team spoke with on call oncologist, holding all meds except GCSF at this time  5. Diarrhea - Check Cdiff, consider IVF if she is not able to eat adequately and continues to have diarrhea - Consider drug effect  As well (antibiotics)  Other issues are noted in Dr. Sandi Raveling  H&P.   Carrie Falcon, MD 1/17/201611:01 AM

## 2014-10-17 NOTE — Progress Notes (Signed)
UR Completed.  [phone]  

## 2014-10-17 NOTE — Progress Notes (Signed)
Subjective:  Productive cough, no dyspnea, good appetite  Objective: Vital signs in last 24 hours: Temp:  [99.6 F (37.6 C)-101.2 F (38.4 C)] 100.7 F (38.2 C) (01/17 0743) Pulse Rate:  [59-103] 61 (01/17 0743) Resp:  [17-25] 18 (01/17 0743) BP: (116-178)/(63-92) 148/74 mmHg (01/17 0743) SpO2:  [90 %-100 %] 94 % (01/17 0743) Weight:  [57.5 kg (126 lb 12.2 oz)] 57.5 kg (126 lb 12.2 oz) (01/16 2000) Weight change:   Intake/Output from previous day: 01/16 0701 - 01/17 0700 In: -  Out: 4000  Intake/Output this shift:   Lab Results:  Recent Labs  10/16/14 1200 10/16/14 1348 10/17/14 0257  WBC 1.5*  --  1.5*  HGB 10.3* 11.9* 10.1*  HCT 32.0* 35.0* 32.4*  PLT 105*  --  87*   BMET:  Recent Labs  10/16/14 1446 10/17/14 0257  NA 131* 134*  K 4.9 4.0  CL 94* 98  CO2 24 26  GLUCOSE 92 94  BUN 71* 23  CREATININE 13.73* 6.65*  CALCIUM 7.9* 8.2*   No results for input(s): PTH in the last 72 hours. Iron Studies: No results for input(s): IRON, TIBC, TRANSFERRIN, FERRITIN in the last 72 hours.  Studies/Results: Dg Chest Port 1 View  10/16/2014   CLINICAL DATA:  74 year old female with progressive shortness of breath and cough over the past 2 days.  EXAM: PORTABLE CHEST - 1 VIEW  COMPARISON:  Prior bone survey including frontal view of the chest 03/12/2014  FINDINGS: The patient is slightly rotated toward the right. Increasing enlargement of the cardiopericardial silhouette. Atherosclerotic and mildly tortuous thoracic aorta. Mild vascular congestion without overt edema. Focal patchy airspace opacity in the right upper lung represents an interval finding. The lungs are hyperinflated. Mild central bronchitic change. Surgical clips are noted in the soft tissues of the medial left upper extremity. No acute osseous abnormality.  IMPRESSION: 1. Focal patchy airspace opacity in the right upper lobe may reflect bronchopneumonia. 2. Progressive enlargement of the cardiopericardial  silhouette which may represent increasing cardiomegaly and/or pericardial effusion. 3. Slightly increased pulmonary vascular congestion without evidence of edema.   Electronically Signed   By: Jacqulynn Cadet M.D.   On: 10/16/2014 12:57   EXAM: General appearance:  Alert, in no apparent distress Resp: Scattered rales bilaterally Cardio: RRR without murmur or rub GI:  + BS, soft and nontender Extremities:  No edema Access:  AVF @ LUA with + bruit  HD: TTS  3h 31mn 2/2.25 Bath 60.5kg AVF LUA Heparin none Aranesp 120 ug / Tues Hectorol 7 ug  Assessment/Plan: 1. Productive cough / fever -  Possibly RUL pneumonia per CXR; T 100.7, on Vancomycin. Also some vol overload, 4kg off without any difficulty yesterday on HD.  2. ESRD - HD on [place] [facility].  Next HD 1/19. 3. HTN/Volume - BP 148/74 on Hydralazine 100 mg q8h; wt 57.5 kg s/p net UF 4 L yesterday, refuses goal > 3 L @ AF, but needs lower EDW. 4. Anemia - Hgb 10.1, outpatient Aranesp 120 mcg on Tues. 5. Sec HPT - Ca 7.9, P 8.5; Hectorol 7 mcg, Phoslo with meals. 6. Nutrition - renal diet, vitamin. 7. Multiple myeloma - Revlimid per Dr. GAlvy Bimler   LOS: 1 day   [name],CHARLES 10/17/2014,8:28 AM  Pt seen, examined, agree w assess/plan as above with additions as indicated. Plan extra HD on Monday, challenge volume further.  RKelly SplinterMD pager [phone]   cell [phone]/17/2016, 11:20 AM

## 2014-10-18 ENCOUNTER — Ambulatory Visit: Payer: Self-pay

## 2014-10-18 ENCOUNTER — Ambulatory Visit: Payer: Self-pay | Admitting: Hematology and Oncology

## 2014-10-18 ENCOUNTER — Other Ambulatory Visit: Payer: Self-pay

## 2014-10-18 DIAGNOSIS — R112 Nausea with vomiting, unspecified: Secondary | ICD-10-CM

## 2014-10-18 DIAGNOSIS — D6959 Other secondary thrombocytopenia: Secondary | ICD-10-CM

## 2014-10-18 DIAGNOSIS — E46 Unspecified protein-calorie malnutrition: Secondary | ICD-10-CM

## 2014-10-18 LAB — URINALYSIS, ROUTINE W REFLEX MICROSCOPIC
Bilirubin Urine: NEGATIVE
GLUCOSE, UA: 250 mg/dL — AB
Ketones, ur: NEGATIVE mg/dL
Nitrite: NEGATIVE
PH: 8.5 — AB (ref 5.0–8.0)
PROTEIN: 100 mg/dL — AB
Specific Gravity, Urine: 1.012 (ref 1.005–1.030)
Urobilinogen, UA: 0.2 mg/dL (ref 0.0–1.0)

## 2014-10-18 LAB — CBC WITH DIFFERENTIAL/PLATELET
Basophils Absolute: 0 10*3/uL (ref 0.0–0.1)
Basophils Relative: 0 % (ref 0–1)
EOS ABS: 0.2 10*3/uL (ref 0.0–0.7)
EOS PCT: 7 % — AB (ref 0–5)
HEMATOCRIT: 34.1 % — AB (ref 36.0–46.0)
HEMOGLOBIN: 10.6 g/dL — AB (ref 12.0–15.0)
LYMPHS ABS: 0.7 10*3/uL (ref 0.7–4.0)
Lymphocytes Relative: 27 % (ref 12–46)
MCH: 31.9 pg (ref 26.0–34.0)
MCHC: 31.1 g/dL (ref 30.0–36.0)
MCV: 102.7 fL — ABNORMAL HIGH (ref 78.0–100.0)
MONOS PCT: 13 % — AB (ref 3–12)
Monocytes Absolute: 0.3 10*3/uL (ref 0.1–1.0)
NEUTROS ABS: 1.4 10*3/uL — AB (ref 1.7–7.7)
Neutrophils Relative %: 54 % (ref 43–77)
Platelets: 80 10*3/uL — ABNORMAL LOW (ref 150–400)
RBC: 3.32 MIL/uL — ABNORMAL LOW (ref 3.87–5.11)
RDW: 15.9 % — AB (ref 11.5–15.5)
WBC: 2.6 10*3/uL — AB (ref 4.0–10.5)

## 2014-10-18 LAB — RENAL FUNCTION PANEL
ALBUMIN: 2.6 g/dL — AB (ref 3.5–5.2)
Anion gap: 10 (ref 5–15)
BUN: 32 mg/dL — AB (ref 6–23)
CO2: 26 mmol/L (ref 19–32)
CREATININE: 8.93 mg/dL — AB (ref 0.50–1.10)
Calcium: 8.1 mg/dL — ABNORMAL LOW (ref 8.4–10.5)
Chloride: 95 mEq/L — ABNORMAL LOW (ref 96–112)
GFR, EST AFRICAN AMERICAN: 4 mL/min — AB (ref >90)
GFR, EST NON AFRICAN AMERICAN: 4 mL/min — AB (ref >90)
Glucose, Bld: 105 mg/dL — ABNORMAL HIGH (ref 70–99)
PHOSPHORUS: 5.9 mg/dL — AB (ref 2.3–4.6)
Potassium: 3.5 mmol/L (ref 3.5–5.1)
Sodium: 131 mmol/L — ABNORMAL LOW (ref 135–145)

## 2014-10-18 LAB — URINE MICROSCOPIC-ADD ON

## 2014-10-18 LAB — PATHOLOGIST SMEAR REVIEW

## 2014-10-18 LAB — HEPATITIS B SURFACE ANTIGEN: Hepatitis B Surface Ag: NEGATIVE

## 2014-10-18 LAB — STREP PNEUMONIAE URINARY ANTIGEN: Strep Pneumo Urinary Antigen: NEGATIVE

## 2014-10-18 MED ORDER — VANCOMYCIN HCL 10 G IV SOLR
1500.0000 mg | Freq: Once | INTRAVENOUS | Status: AC
  Administered 2014-10-18: 1500 mg via INTRAVENOUS
  Filled 2014-10-18: qty 1500

## 2014-10-18 MED ORDER — CEFEPIME HCL 2 G IJ SOLR
2.0000 g | Freq: Once | INTRAMUSCULAR | Status: AC
  Administered 2014-10-18: 2 g via INTRAVENOUS
  Filled 2014-10-18: qty 2

## 2014-10-18 MED ORDER — PRO-STAT SUGAR FREE PO LIQD
30.0000 mL | Freq: Every day | ORAL | Status: DC
  Filled 2014-10-18: qty 30

## 2014-10-18 MED ORDER — NEPRO/CARBSTEADY PO LIQD: 237.0000 mL | Freq: Two times a day (BID) | ORAL | Status: DC

## 2014-10-18 NOTE — Progress Notes (Signed)
ANTIBIOTIC CONSULT NOTE   Pharmacy Consult for Vancomycin & Cefepime Indication: HCAP  No Known Allergies  Patient Measurements: Height: 5' 3" (160 cm) Weight: 128 lb (58.06 kg) IBW/kg (Calculated) : 52.4 TBW: 63 kg  Vital Signs: Temp: 98.3 F (36.8 C) (01/18 0848) Temp Source: Oral (01/18 0848) BP: 125/71 mmHg (01/18 0848) Pulse Rate: 64 (01/18 0848) Intake/Output from previous day: 01/17 0701 - 01/18 0700 In: 360 [P.O.:360] Out: -  Intake/Output from this shift: Total I/O In: 320 [P.O.:320] Out: -   Labs:  Recent Labs  10/16/14 1200  10/16/14 1348 10/16/14 1446 10/17/14 0257 10/18/14 0555  WBC 1.5*  --   --   --  1.5* 2.6*  HGB 10.3*  --  11.9*  --  10.1* 10.6*  PLT 105*  --   --   --  87* 80*  CREATININE  --   < > 13.70* 13.73* 6.65* 8.93*  < > = values in this interval not displayed. Estimated Creatinine Clearance: 4.5 mL/min (by C-G formula based on Cr of 8.93). No results for input(s): VANCOTROUGH, VANCOPEAK, VANCORANDOM, GENTTROUGH, GENTPEAK, GENTRANDOM, TOBRATROUGH, TOBRAPEAK, TOBRARND, AMIKACINPEAK, AMIKACINTROU, AMIKACIN in the last 72 hours.   Microbiology: Recent Results (from the past 720 hour(s))  Culture, blood (routine x 2)      Status: None (Preliminary result)   Collection Time: 10/16/14  1:37 PM  Result Value Ref Range Status   Specimen Description BLOOD RIGHT ARM  Final   Special Requests BOTTLES DRAWN AEROBIC AND ANAEROBIC 4CC  Final   Culture   Final           BLOOD CULTURE RECEIVED NO GROWTH TO DATE CULTURE WILL BE HELD FOR 5 DAYS BEFORE ISSUING A FINAL NEGATIVE REPORT Performed at [facility]    Report Status PENDING  Incomplete  Culture, blood (routine x 2)      Status: None (Preliminary result)   Collection Time: 10/16/14  1:42 PM  Result Value Ref Range Status   Specimen Description BLOOD RIGHT ARM  Final   Special Requests BOTTLES DRAWN AEROBIC AND ANAEROBIC 5CC  Final   Culture   Final           BLOOD CULTURE  RECEIVED NO GROWTH TO DATE CULTURE WILL BE HELD FOR 5 DAYS BEFORE ISSUING A FINAL NEGATIVE REPORT Performed at [facility]    Report Status PENDING  Incomplete  Culture, expectorated sputum-assessment     Status: None   Collection Time: 10/17/14  8:17 AM  Result Value Ref Range Status   Specimen Description SPUTUM  Final   Special Requests NONE  Final   Sputum evaluation   Final    MICROSCOPIC FINDINGS SUGGEST THAT THIS SPECIMEN IS NOT REPRESENTATIVE OF LOWER RESPIRATORY SECRETIONS. PLEASE RECOLLECT. CALLED A.MINTZ,RN 10/17/14 @ 1300 BY V.WILKINS   Report Status 10/17/2014 FINAL  Final  Clostridium Difficile by PCR     Status: None   Collection Time: 10/17/14  9:25 AM  Result Value Ref Range Status   C difficile by pcr NEGATIVE NEGATIVE Final    Medical History: Past Medical History  Diagnosis Date  . Renal disorder   . ESRD (end stage renal disease)   . Hypertension   . Multiple myeloma 2009  . Thyroid disease   . Hyperparathyroidism   . Thrombocytopenia   . Anemia   . Shortness of breath   . Pneumonia   . GERD (gastroesophageal reflux disease)   . Arthritis     rt knee is  stiff   Assessment: 74 yo F presented on 1/16 with diarrhea. CXR found to be remarkable for R patchy infiltrates and pt has a low grade fever. PMH significant for ESRD, HTN, multiple myeloma, thrombocytopenia. HD Schedule: TThS. Was supposed to get vancomycin and cefepime x 1 on 1/16, but only cefepime was given.  Pt had missed some HD sessions last week, and MD wanted to have extra session today (1/18) as volume overloaded, but pt refusing. Plan to have HD tomorrow (1/19).   Goal of Therapy:  Eradication of infection Vanc pre-HD level 15-25 mcg/mL  Vanc post-HD level 5-15 mcg/mL   Plan:  Vancomycin 1500mg IV x 1 Cefepime 2g x 1, and 2 g w/HD tomorrow Monitor clinical picture, VR prn, C&S, LOT  ,  PharmD Student 10/18/2014,10:03 AM   

## 2014-10-18 NOTE — Progress Notes (Signed)
Pt seen and examined with Dr. Sherrine Maples. Case d/w residents in detail on morning rounds. I agree with findings and plan as documented in Dr. Sandi Raveling note.  -Pt noted to have b/l crackles R> L but stated her breathing was improved - c/w vancomycin. Would resume cefepime while inpatient for HCAP with neutropenia (now resolved neutropenia) - would transition to PO levaquin in AM - c/w HD per nephrology (pt refused HD today as she normally goes Tue/thurs/sat) - Blood cx with NGTD -  ANC now >1. OK to d/c granix. Neutropenia likely secondary to recent chemotherapy - Would monitor platelets (mild- moderate thrombocytopenia). Thrombocytopenia likely secondary to recent chemotherapy - Chemotherapy on hold secondary to acute infection. Onc f/u noted - Will need PT/OT eval

## 2014-10-18 NOTE — Progress Notes (Signed)
[name]   DOB:04-18-1941   MO#:[id number]   BEE#:[id number]  Patient Care Team: No Pcp Per Patient as PCP - General (General Practice)  I have seen the patient, examined her and edited the notes as follows  Subjective: [name] is a 74 year old woman with a history of Multiple Myeloma admitted on 1/16 with a 3 day history of white productive cough and shortness of breath. She was afebrile. She denied any chest pain or palpitations. She complained of decreased appetite, nausea and intermittent vomiting. She continues to have chronic diarrhea. Her chronic pain is well controlled.  Because of these symptoms, patient missed 2 hemodialysis appointments with subsequent electrolyte imbalance, now well managed by the Renal team.  Chest x ray on 1/16 showed a right middle lobe infiltrate. Cultures were drawn. She was placed on Vanco and Cefepime, as well as nebulizers, oxygen and antitussives with improvement of symptoms. Her CBC was abnormal, with a WBC 1.5, ANC0.9. Revlimid and Dexamethasone were placed on hold. Granix (G-CSF) was initiated on 1/16 with slight increase: her WBC today is 2.5 with ANC of 1.4. Her platelets were 105k, today at 80k without bleeding issues. Hemoglobin is stable at 10.6. We have been informed of the patient's admission, as she was due for chemotherapy today, which has now been placed on hold due to infection/ hospitalization  Summary of Oncological History    Multiple myeloma   03/30/2008 Initial Diagnosis Multiple myeloma   03/30/2008 - 05/01/2012 Chemotherapy Revlimid/Dex then Velcade (started on 07/13/2011 ending on 09/14/2011) without response. Managed by [name] of [place], [place] [id number]   07/24/2010 -  Chemotherapy Received zometa intermittently over one year. Creatinine was 1.0 on 07/24/2010. 1.7 on 08/10/2011.    05/01/2012 - 09/01/2012 Chemotherapy Starte Pomalidomide s/p 4 cycles. Referred to [facility] for consideration of ASCT.     10/08/2012 Bone Marrow Biopsy Normocellular marrow with residual/recurrently plasma cell myeloma, 60-70 % of overall marrow cellularity. Flow: c/w plasma cell neoplasm, plasma cell infiltrate comprises 60-70 % of overall marrow cellularity; FISH: + for 1 q21/CKS1B gain; RB1 (13q14)   10/08/2012 Bone Marrow Biopsy Continued... Cytogenetics showed normal female karyotype.    10/29/2012 Tumor Marker High IgG (6019), high lamda free light chain (409.32) with low IgA, IgM and kappa free light chain.    04/21/2013 Imaging Skeletal survey. No suspcious lytic or blastic lesions visualized. Severe multilevel degenerative disc disease in the cervical spine.    04/29/2013 Imaging PeT. No discrete suspicious focus of FDG uptake identified.    05/20/2013 - 06/09/2013 Chemotherapy Started carfilxomib 39 mg on 08/25, 08/26, day 8 (06/02/13), day 9 on 06/03/2013 and on 06/09/2013.    12/03/2013 Treatment Plan Change Last office visit with [name]. She declined further chemotherapy. Her son passed away and she relocated to [place] to stay with her daughter [name].    03/11/2014 - 03/17/2014 Hospital Admission Admitted for low hemoglobin. [name] consulted. SPEP, UPEP and Bone marrow biopsy obtained. IgG 11,300. M-spike 7.52, kappa lamda ratio 0 with lambda free light chains totaling 1260.    03/12/2014 Imaging Skeletal survey. 1. Small lytic lesions within the skull and right scapula. 2. Degenerative changes in the spine and knees, right greater than left. 3. Cardiomegaly without pulmonary edema.   03/17/2014 Bone Marrow Biopsy Hypercellular bone marrow tih extensive involvment by plasma cell neoplasm (Plasma cells 85%). FISH: Presence of 2 copies of the IGH/FGFR3, ATM, CCND1/IGH, and p53 probes. Loss of 13q34 probes in 40% of cells.  gain of chromose 12 and either loss of chorm   03/25/2014 -  Chemotherapy Planning Kyprolis 15 mg/m2 plus dexamethasone weekly plus  revlimid (54m) daily on 06/29.   06/14/2014 -  Chemotherapy Patient's last dose of Kyprolis was on 05/26/14. Since then chemo held due to neutropenia despite GCSF support. Revlimid also on hold as of 06/14/14   06/28/2014 -  Chemotherapy New cycle of Kyprolis starts today.   07/07/2014 Tumor Marker IgG 3960    07/23/2014 Tumor Marker IgG 3620   09/20/14 Chemotherapy Cycle 7 of Kiprolis/Dexamethasone/Revlimid modified dose was given   10/16/14 Hospitalization Cycle 8 of chemo scheduled for 10/18/14 on hold due to hospitalization for Healthcare associated pneumonia   10/16/14 Imaging right middle lobe infiltrate         Scheduled Meds: . calcium acetate  667 mg Oral TID WC  . ceFEPime (MAXIPIME) IV  2 g Intravenous Once  . [START ON 10/19/2014] doxercalciferol  7 mcg Intravenous Q T,Th,Sa-HD  . hydrALAZINE  100 mg Oral 3 times per day  . isosorbide mononitrate  60 mg Oral Daily  . vancomycin  1,500 mg Intravenous Once   Continuous Infusions:  PRN Meds:ipratropium, LORazepam   Objective:  Filed Vitals:   10/18/14 0848  BP: 125/71  Pulse: 64  Temp: 98.3 F (36.8 C)  Resp: 16      Intake/Output Summary (Last 24 hours) at 10/18/14 1247 Last data filed at 10/18/14 0849  Gross per 24 hour  Intake    680 ml  Output      0 ml  Net    680 ml    ECOG PERFORMANCE STATUS: 3  GENERAL:alert, no distress and comfortable SKIN: skin color, texture, turgor are normal, no rashes or significant lesions EYES: normal, conjunctiva are pale and non-injected, sclera clear OROPHARYNX:no exudate, no erythema and lips, buccal mucosa, and tongue normal  NECK: supple, thyroid normal size, non-tender, without nodularity LYMPH:  no palpable lymphadenopathy in the cervical, axillary or inguinal LUNGS:bibasilar crackles, no wheezing or rhonchi, normal breathing effort HEART: regular rate & rhythm and no murmurs and no lower extremity edema ABDOMEN:abdomen soft, non-tender and normal bowel  sounds Musculoskeletal:no cyanosis of digits and no clubbing  PSYCH: alert & oriented x 3 with fluent speech NEURO: no focal motor/sensory deficits    CBG (last 3)  No results for input(s): GLUCAP in the last 72 hours.   Labs:   Recent Labs Lab 10/16/14 1200 10/16/14 1348 10/17/14 0257 10/18/14 0555  WBC 1.5*  --  1.5* 2.6*  HGB 10.3* 11.9* 10.1* 10.6*  HCT 32.0* 35.0* 32.4* 34.1*  PLT 105*  --  87* 80*  MCV 99.1  --  100.3* 102.7*  MCH 31.9  --  31.3 31.9  MCHC 32.2  --  31.2 31.1  RDW 16.0*  --  16.0* 15.9*  LYMPHSABS 0.5*  --  0.3* 0.7  MONOABS 0.1  --  0.2 0.3  EOSABS 0.0  --  0.1 0.2  BASOSABS 0.0  --  0.0 0.0     Chemistries:    Recent Labs Lab 10/16/14 1348 10/16/14 1446 10/17/14 0257 10/18/14 0555  NA 130* 131* 134* 131*  K 5.1 4.9 4.0 3.5  CL 95* 94* 98 95*  CO2  --  '24 26 26  ' GLUCOSE 80 92 94 105*  BUN 68* 71* 23 32*  CREATININE 13.70* 13.73* 6.65* 8.93*  CALCIUM  --  7.9* 8.2* 8.1*    GFR Estimated Creatinine Clearance: 4.5 mL/min (by C-G  formula based on Cr of 8.93).  Liver Function Tests:  Recent Labs Lab 10/18/14 0555  ALBUMIN 2.6*    Cardiac Enzymes:  Recent Labs Lab 10/16/14 1700 10/17/14 0251  TROPONINI 0.12* 0.13*   Thyroid function studies  Recent Labs  10/17/14 0251  TSH 0.954   Microbiology C. Difficile negative Rest of the cultures pending  Assessment/Plan: 74 y.o.   Multiple Myeloma, Ig Lambda S/p Cycle 7 of Kiprolis/Dexamethasone/Revlimid Treatment, due on 1/18, was placed on hold due to current hospitalization Will resume when patient is discharged from [place] Pneumonia Chest X ray on 1/16 showed Right middle lobe infiltrate consistent with pneumonia She was placed on Vanco and Cefepime, as well as nebulizers, oxygen and antitussives with improvement of symptoms. Continue supportive care as per primary team If she does not improve, she may be a candidate for IVIG.  Anemia  in neoplastic disease Due to recent chemotherapy, malnutrition, dilution, infection, Renal disease and antibiotics No transfusion is indicated at this time Monitor counts closely Transfuse blood to maintain a Hb of 8 g or if the patient is acutely bleeding  Thrombocytopenia Due to recent chemotherapy, malnutrition, dilution, infection, antibiotics No bleeding issues are noted No transfusion is indicated at this time Monitor counts closely Transfuse 1 unit of platelets if count is less or equal than 10,000 or 20,000 if the patient is acutely bleeding  Neutropenia Due to recent chemotherapy, infection, dilution She received Granix (G-CSF) 300 mg on 1/16 and 1/17, as  [place] wasless than 1.0 Her ANC today is 1.4, I recommend continue until [place] >1500 Continue to closely monitor, supportive care  Diarrhea, chronic Malnutrition Exacerbated by current antibiotics, malnutrition, recent chemotherapy C difficile is negative Continue supportive care. Consider Nutrition evaluation.   End Stage Renal Disease On hemodialysis Tuesdays/Thursdays/Saturdays though AV fistula Appreciate Renal involvement  Mild Troponin Elevation She is asymptomatic for cardiac chest pain at this time This is likely due to Renal Failure, followed by primary team  Full Code  Discharge planning I will return in 2 days time to reassess. She can be discharged once her condition stable and she is successfully transitioned to oral antibiotics. Continue to hold chemotherapy for now.  Other medical issues as per admitting team     **Disclaimer: This note was dictated with voice recognition software. Similar sounding words can inadvertently be transcribed and this note may contain transcription errors which may not have been corrected upon publication of note.Sharene Butters E, PA-C 10/18/2014  12:47 PM Etha Stambaugh, MD 10/18/2014

## 2014-10-18 NOTE — Progress Notes (Signed)
INITIAL NUTRITION ASSESSMENT  Pt meets criteria for SEVERE MALNUTRITION in the context of chronic illness as evidenced by a 11.7% weight loss in 2 months, energy intake </= 75% for >/= 1 month, and severe fat and muscle mass loss.  DOCUMENTATION CODES Per approved criteria  -Severe malnutrition in the context of chronic illness   INTERVENTION: Provide Nepro Shake po BID, each supplement provides 425 kcal and 19 grams protein.  Provide 30 ml Prostat po once daily, each supplement provides 100 kcal and 15 grams of protein.  Encourage adequate PO intake.  NUTRITION DIAGNOSIS: Increased nutrient needs related to chronic illness, cancer as evidenced by estimated nutrition needs.   Goal: Pt to meet >/= 90% of their estimated nutrition needs   Monitor:  PO intake, weight trends, labs, I/O's  Reason for Assessment: MST  74 y.o. female  Admitting Dx: HCAP (healthcare-associated pneumonia)  ASSESSMENT: Pt with a history of ESRD (T, R, Sa), multiple myeloma IIIB (diagnosed in 2009, currently on chemotherapy), hypertension, anemia in neoplastic disease and drug-induced neutropenia who presented with a three day history of productive cough and shortness of breath.  Pt reports having a decreased appetite, which has been ongoing. Meal completion is varied from 25-100%. Pt reports she has been eating 1-2 meals a day, but mostly 2 meals. Per Epic weight records, pt with a 11.7% weight loss in 2 months. She reports she has been having weight loss since starting HD in October 2014 with usual weight of 178 lbs. Pt has been having ongoing diarrhea. Pt is agreeable to oral supplements to help aid in caloric and protein needs as well as preventing further weight loss. RD to order. Pt was encouraged to eat her food at meals and to take her supplements.  Nutrition Focused Physical Exam:  Subcutaneous Fat:  Orbital Region: N/A Upper Arm Region: Moderate depletion Thoracic and Lumbar Region: Severe  depletion  Muscle:  Temple Region: Moderate depletion Clavicle Bone Region: Moderate to severe depletion Clavicle and Acromion Bone Region: Moderate to severe depletion Scapular Bone Region: N/A Dorsal Hand: N/A Patellar Region: Moderate depletion Anterior Thigh Region: Moderate depletion Posterior Calf Region: Moderate depletion  Edema: none  Labs: Low sodium, chloride, calcium, GFR. High BUN, creatinine, phosphorous (5.9).  Height: Ht Readings from Last 1 Encounters:  10/17/14 '5\' 3"'  (1.6 m)    Weight: Wt Readings from Last 1 Encounters:  10/17/14 128 lb (58.06 kg)    Ideal Body Weight: 115 lbs  % Ideal Body Weight: 111%  Wt Readings from Last 10 Encounters:  10/17/14 128 lb (58.06 kg)  09/20/14 138 lb 14.4 oz (63.005 kg)  08/30/14 145 lb 6.4 oz (65.953 kg)  08/23/14 140 lb (63.504 kg)  08/09/14 144 lb 8 oz (65.545 kg)  08/02/14 140 lb 8 oz (63.73 kg)  07/23/14 137 lb 6.4 oz (62.324 kg)  07/07/14 144 lb 12.8 oz (65.681 kg)  06/28/14 139 lb 8 oz (63.277 kg)  06/21/14 138 lb 8 oz (62.823 kg)    Usual Body Weight: 178 lbs (pt reports prior to start of HD in Oct. 2014)  % Usual Body Weight: 72%  BMI:  Body mass index is 22.68 kg/(m^2).  Estimated Nutritional Needs: Kcal: 1900-2100 Protein: 85-100 grams Fluid: 1.2 L/day  Skin: intact  Diet Order: Diet renal W/1273m fluid restriction  EDUCATION NEEDS: -Education needs addressed   Intake/Output Summary (Last 24 hours) at 10/18/14 0936 Last data filed at 10/18/14 0849  Gross per 24 hour  Intake    680  ml  Output      0 ml  Net    680 ml    Last BM: 1/17  Labs:   Recent Labs Lab 10/16/14 1446 10/16/14 1700 10/17/14 0257 10/18/14 0555  NA 131*  --  134* 131*  K 4.9  --  4.0 3.5  CL 94*  --  98 95*  CO2 24  --  26 26  BUN 71*  --  23 32*  CREATININE 13.73*  --  6.65* 8.93*  CALCIUM 7.9*  --  8.2* 8.1*  PHOS  --  8.5*  --  5.9*  GLUCOSE 92  --  94 105*    CBG (last 3)  No results for  input(s): GLUCAP in the last 72 hours.  Scheduled Meds: . calcium acetate  667 mg Oral TID WC  . ceFEPime (MAXIPIME) IV  2 g Intravenous Once  . [START ON 10/19/2014] doxercalciferol  7 mcg Intravenous Q T,Th,Sa-HD  . hydrALAZINE  100 mg Oral 3 times per day  . isosorbide mononitrate  60 mg Oral Daily  . Tbo-filgastrim (GRANIX) SQ  300 mcg Subcutaneous q1800  . vancomycin  1,500 mg Intravenous Once    Continuous Infusions:   Past Medical History  Diagnosis Date  . Renal disorder   . ESRD (end stage renal disease)   . Hypertension   . Multiple myeloma 2009  . Thyroid disease   . Hyperparathyroidism   . Thrombocytopenia   . Anemia   . Shortness of breath   . Pneumonia   . GERD (gastroesophageal reflux disease)   . Arthritis     rt knee is stiff    Past Surgical History  Procedure Laterality Date  . Abdominal hysterectomy      Pt. denies    Kallie Locks, MS, RD, LDN Pager # [phone] After hours/ weekend pager # [phone]

## 2014-10-18 NOTE — Progress Notes (Signed)
Subjective: [name] feels good today. She thinks she is breathing well. She has no interest in HD today, since her schedule is to receive it T/R/Sa. She is eager to see the oncologist to figure out her chemotherapy plan.  Objective: Vital signs in last 24 hours: Filed Vitals:   10/17/14 1703 10/17/14 2106 10/17/14 2255 10/18/14 0525  BP: 134/70 129/74  158/103  Pulse: 55 57  59  Temp: 98.9 F (37.2 C) 98.5 F (36.9 C)  98.6 F (37 C)  TempSrc: Oral Oral  Oral  Resp: '17 18  18  ' Height:   '5\' 3"'  (1.6 m)   Weight:  128 lb (58.06 kg)    SpO2: 95% 96%  95%   Weight change: 1 lb 3.8 oz (0.56 kg)  Intake/Output Summary (Last 24 hours) at 10/18/14 0759 Last data filed at 10/17/14 1830  Gross per 24 hour  Intake    360 ml  Output      0 ml  Net    360 ml   Physical Exam: Appearance: in NAD in bed, no coughing during exam today, no supplemental oxygen, empty breakfast tray at bedside HEENT: AT/[place], PERRL, EOMi, no ocular discharge, sounds congested, MMM Heart: RRR, normal S1S2, no MRG, +JVD Lungs: Decreased breath sounds, diffuse expiratory wheezing, no rales R>L, normal inspiratory effort Abdomen: BS+, soft, nontender to palpation Musculoskeletal: no LE edema b/l, left access Neurologic: A&Ox3, grossly intact Skin: no rashes or lesions  Lab Results: Basic Metabolic Panel:  Recent Labs Lab 10/16/14 1700 10/17/14 0257 10/18/14 0555  NA  --  134* 131*  K  --  4.0 3.5  CL  --  98 95*  CO2  --  26 26  GLUCOSE  --  94 105*  BUN  --  23 32*  CREATININE  --  6.65* 8.93*  CALCIUM  --  8.2* 8.1*  PHOS 8.5*  --  5.9*   CBC:  Recent Labs Lab 10/17/14 0257 10/18/14 0555  WBC 1.5* 2.6*  NEUTROABS 0.9* 1.4*  HGB 10.1* 10.6*  HCT 32.4* 34.1*  MCV 100.3* 102.7*  PLT 87* 80*   Cardiac Enzymes:  Recent Labs Lab 10/16/14 1700 10/17/14 0251  TROPONINI 0.12* 0.13*   Thyroid Function Tests:  Recent Labs Lab 10/17/14 0251  TSH 0.954   Micro Results: Recent  Results (from the past 240 hour(s))  Culture, blood (routine x 2)      Status: None (Preliminary result)   Collection Time: 10/16/14  1:37 PM  Result Value Ref Range Status   Specimen Description BLOOD RIGHT ARM  Final   Special Requests BOTTLES DRAWN AEROBIC AND ANAEROBIC 4CC  Final   Culture   Final           BLOOD CULTURE RECEIVED NO GROWTH TO DATE CULTURE WILL BE HELD FOR 5 DAYS BEFORE ISSUING A FINAL NEGATIVE REPORT Performed at [facility]    Report Status PENDING  Incomplete  Culture, blood (routine x 2)      Status: None (Preliminary result)   Collection Time: 10/16/14  1:42 PM  Result Value Ref Range Status   Specimen Description BLOOD RIGHT ARM  Final   Special Requests BOTTLES DRAWN AEROBIC AND ANAEROBIC 5CC  Final   Culture   Final           BLOOD CULTURE RECEIVED NO GROWTH TO DATE CULTURE WILL BE HELD FOR 5 DAYS BEFORE ISSUING A FINAL NEGATIVE REPORT Performed at [facility]  Report Status PENDING  Incomplete  Culture, expectorated sputum-assessment     Status: None   Collection Time: 10/17/14  8:17 AM  Result Value Ref Range Status   Specimen Description SPUTUM  Final   Special Requests NONE  Final   Sputum evaluation   Final    MICROSCOPIC FINDINGS SUGGEST THAT THIS SPECIMEN IS NOT REPRESENTATIVE OF LOWER RESPIRATORY SECRETIONS. PLEASE RECOLLECT. CALLED A.MINTZ,RN 10/17/14 @ 1300 BY V.WILKINS   Report Status 10/17/2014 FINAL  Final  Clostridium Difficile by PCR     Status: None   Collection Time: 10/17/14  9:25 AM  Result Value Ref Range Status   C difficile by pcr NEGATIVE NEGATIVE Final   Studies/Results: Dg Chest Port 1 View  10/16/2014   CLINICAL DATA:  74 year old female with progressive shortness of breath and cough over the past 2 days.  EXAM: PORTABLE CHEST - 1 VIEW  COMPARISON:  Prior bone survey including frontal view of the chest 03/12/2014  FINDINGS: The patient is slightly rotated toward the right. Increasing enlargement of the  cardiopericardial silhouette. Atherosclerotic and mildly tortuous thoracic aorta. Mild vascular congestion without overt edema. Focal patchy airspace opacity in the right upper lung represents an interval finding. The lungs are hyperinflated. Mild central bronchitic change. Surgical clips are noted in the soft tissues of the medial left upper extremity. No acute osseous abnormality.  IMPRESSION: 1. Focal patchy airspace opacity in the right upper lobe may reflect bronchopneumonia. 2. Progressive enlargement of the cardiopericardial silhouette which may represent increasing cardiomegaly and/or pericardial effusion. 3. Slightly increased pulmonary vascular congestion without evidence of edema.   Electronically Signed   By: Jacqulynn Cadet M.D.   On: 10/16/2014 12:57   Medications: I have reviewed the patient's current medications. Scheduled Meds: . calcium acetate  667 mg Oral TID WC  . [START ON 10/19/2014] doxercalciferol  7 mcg Intravenous Q T,Th,Sa-HD  . hydrALAZINE  100 mg Oral 3 times per day  . isosorbide mononitrate  60 mg Oral Daily  . Tbo-filgastrim (GRANIX) SQ  300 mcg Subcutaneous q1800   Continuous Infusions:  PRN Meds:.ipratropium, LORazepam Assessment/Plan: Principal Problem:   HCAP (healthcare-associated pneumonia) Active Problems:   ESRD (end stage renal disease)   Multiple myeloma   Drug-induced neutropenia   SOB (shortness of breath)  [name] is a 74 yo woman admitted for HCAP treatment. Her recent symptoms of URI caused her to miss HD. She received HD on the day of admission and was on the schedule for today. She refused (as her schedule is T/R/Sa). She appears to be responding to treatment and feels much better.  HCAP: Patient w/ RML infiltrate on CXR and recent h/o cough and SOB. No clear evidence of volume overload despite missing one HD session. Started on Vancomycin + Cefepime in ED. Flu and c diff negative. - Continue cefepime per pharmacy - Vancomycin per  pharmacy - Transition to oral antibiotics tomorrow - Atrovent nebs - Sputum culture needs to be recollected, blood cultures NGTD - Urine antigens need to be collected - Continue droplet isolation / neutropenia precautions regardless of flu result - CBC with diff in am daily - O2 PRN for O2 sat >92%; patient not requiring  ESRD: Receives HD T/R/Sa. Missed on R, completed on Sa, needs today. Normally follows at [facility]. - Appreciate renal consult - HD tomorrow  Slightly Elevated Troponin: 0.12-->0.13 Likely secondary to ESRD. Patient asymptomatic with normal EKG.  Diarrhea: Patient reports >1 year of loose BMs that have continued in  the hospital. It is very possible that Kyprolis and Revlimid are causing her diarrhea (occurs in 33 and 50% of patients on these agents, respectively). C diff negative. 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 Follows w/ [name], who will visit patient if still admitted Monday. Has been on chemo + Revlemid + Dexamethasone. Has required G-CSF for neutropenia, given every Friday. Discussed w/ Dr. Lindi Adie (onc), will hold MM medications and give Granix (at 56mg/kg dose) until AC[place]ago>1.0. ANC 0.9-->1.4 yesterday and today. Patient was scheduled for chemo treatment today. - Holding Revlimid and dexamethasone - Holding Granix 300 mcg today (ANC>1.0)   - CBC with diff every morning  Hypertension: Currently 158/103 (peak in last 24 hours). - Continue home hydralazine 100 mg TID - Continue home Imdur 60 mg daily  Anemia in Neoplastic Disease: Hemoglobin 10.6 (baseline 10.3). Asymptomatic. Does not require transfusion or erythropoietin stimulating agent now per recent oncology note. - Per onc, transfuse to maintain hgb 8.0 or if patient actively bleeds  Drug-Induced Neutropenia: WBC has been 1.5, now up to 2.6 (near baseline). Likely due to recent  treatments and has been present on recent exam. Asymptomatic. To continue chemotherapy without adjustment per recent onc note. May have to delay therapy in future if continues to drop. - Transfuse 1 unit platelets if count <10,000 or 20,000 if patient acutely bleeds  History of Thyroid Disease:  - TSH negative  Diet: Patient has malnutrition - Nutrition evaluation today - Renal  DVT Ppx: - Stopped heparin (patient has chronic thrombocytosis) - SCDs - PT and OT evaluations today - Encourage OOB  Dispo: Disposition is deferred at this time, awaiting improvement of current medical problems.  Anticipated discharge in approximately 1 day(s).   The patient does not have a current PCP (No Pcp Per Patient) and [facility].  The patient does not have transportation limitations that hinder transportation to clinic appointments.  .Services Needed at time of discharge: Y = Yes, Blank = No PT:   OT:   RN:   Equipment:   Other:     LOS: 2 days   JDrucilla Schmidt MD 10/18/2014, 7:59 AM

## 2014-10-18 NOTE — Progress Notes (Signed)
10/18/2014 7:00 AM  Per Almyra Brace the Hemo RN patient refused to go to dialysis today because today is one of her normal chemo days. She stated that she does chemo on MWF and HD on TThS. Dain RN will let Renal MD know about the refusal. On coming new RN will be notified by this RN as well  Callie Facey Charlott Holler, RN

## 2014-10-18 NOTE — Progress Notes (Signed)
Patient ID: [name], female   DOB: 04-Dec-1940, 74 y.o.   MRN: [id number]  [facility] Progress Note    Subjective:   Still "congested" and c/o diarrhea (for almost a year) and loss of appetite, and demands that both of them be fixed before she goes home   Objective:   BP 158/103 mmHg  Pulse 59  Temp(Src) 98.6 F (37 C) (Oral)  Resp 18  Ht _0  (1.6 m)  Wt 58.06 kg (128 lb)  BMI 22.68 kg/m2  SpO2 95%  Intake/Output: I/O last 3 completed shifts: In: 360 [P.O.:360] Out: 4000 [Other:4000]   Intake/Output this shift:    Weight change: 0.56 kg (1 lb 3.8 oz)  Physical Exam: ION:GEXBM, elderly AAF in NAD CVS:no rub Resp:bilateral rales WUX:LKGMWN Ext:no edema, LUE AVF +T/B  Labs: BMET  Recent Labs Lab 10/16/14 1348 10/16/14 1446 10/16/14 1700 10/17/14 0257 10/18/14 0555  NA 130* 131*  --  134* 131*  K 5.1 4.9  --  4.0 3.5  CL 95* 94*  --  98 95*  CO2  --  24  --  26 26  GLUCOSE 80 92  --  94 105*  BUN 68* 71*  --  23 32*  CREATININE 13.70* 13.73*  --  6.65* 8.93*  ALBUMIN  --   --   --   --  2.6*  CALCIUM  --  7.9*  --  8.2* 8.1*  PHOS  --   --  8.5*  --  5.9*   CBC  Recent Labs Lab 10/16/14 1200 10/16/14 1348 10/17/14 0257 10/18/14 0555  WBC 1.5*  --  1.5* 2.6*  NEUTROABS 0.9*  --  0.9* 1.4*  HGB 10.3* 11.9* 10.1* 10.6*  HCT 32.0* 35.0* 32.4* 34.1*  MCV 99.1  --  100.3* 102.7*  PLT 105*  --  87* 80*    _1 @ Medications:    . calcium acetate  667 mg Oral TID WC  . [START ON 10/19/2014] doxercalciferol  7 mcg Intravenous Q T,Th,Sa-HD  . hydrALAZINE  100 mg Oral 3 times per day  . isosorbide mononitrate  60 mg Oral Daily  . Tbo-filgastrim (GRANIX) SQ  300 mcg Subcutaneous q1800   HD: TTS  3h 69mn 2/2.25 [place], EDW- 57.5kg (previous was 60.5kg) AVF LUA Heparin none Aranesp 120 ug / Tues Hectorol 7 ug  Assessment/ Plan:   1. HCAP- on abx per primary svc 2. ESRD- pt normally TTS and was scheduled for extra  treatment due to vol excess, however pt is refusing 3. SOB- mainly due to PNA but also with volume overload (has been losing weight and is well below her EDW).  She refused an extra treatment today but will cont to challenge EDW with HD tomorrow. 4. Neutropenia- Granix per primary svc 5. Multiple myeloma- hold chemo and ask Heme/Onc to see (per pt request) 6. Anemia:- stable 7. CKD-MBD: Phos improving 8. Nutrition: protein malnutrition and poor appetite, likely related to advanced age and myeloma 994 Hypertension:stable  Hilja Kintzel A 10/18/2014, 8:43 AM

## 2014-10-19 ENCOUNTER — Other Ambulatory Visit: Payer: Self-pay | Admitting: Hematology and Oncology

## 2014-10-19 LAB — CBC WITH DIFFERENTIAL/PLATELET
Basophils Absolute: 0 10*3/uL (ref 0.0–0.1)
Basophils Relative: 0 % (ref 0–1)
EOS ABS: 0.2 10*3/uL (ref 0.0–0.7)
EOS PCT: 5 % (ref 0–5)
HCT: 31.7 % — ABNORMAL LOW (ref 36.0–46.0)
Hemoglobin: 9.9 g/dL — ABNORMAL LOW (ref 12.0–15.0)
LYMPHS PCT: 29 % (ref 12–46)
Lymphs Abs: 1.2 10*3/uL (ref 0.7–4.0)
MCH: 31.1 pg (ref 26.0–34.0)
MCHC: 31.2 g/dL (ref 30.0–36.0)
MCV: 99.7 fL (ref 78.0–100.0)
MONO ABS: 0.5 10*3/uL (ref 0.1–1.0)
Monocytes Relative: 13 % — ABNORMAL HIGH (ref 3–12)
Neutro Abs: 2.1 10*3/uL (ref 1.7–7.7)
Neutrophils Relative %: 53 % (ref 43–77)
Platelets: 79 10*3/uL — ABNORMAL LOW (ref 150–400)
RBC: 3.18 MIL/uL — ABNORMAL LOW (ref 3.87–5.11)
RDW: 15.8 % — ABNORMAL HIGH (ref 11.5–15.5)
WBC: 4 10*3/uL (ref 4.0–10.5)

## 2014-10-19 LAB — LEGIONELLA ANTIGEN, URINE

## 2014-10-19 LAB — BASIC METABOLIC PANEL
Anion gap: 11 (ref 5–15)
BUN: 46 mg/dL — ABNORMAL HIGH (ref 6–23)
CALCIUM: 8.3 mg/dL — AB (ref 8.4–10.5)
CO2: 24 mmol/L (ref 19–32)
Chloride: 96 mEq/L (ref 96–112)
Creatinine, Ser: 11.03 mg/dL — ABNORMAL HIGH (ref 0.50–1.10)
GFR calc Af Amer: 3 mL/min — ABNORMAL LOW (ref >90)
GFR calc non Af Amer: 3 mL/min — ABNORMAL LOW (ref >90)
Glucose, Bld: 89 mg/dL (ref 70–99)
POTASSIUM: 3.9 mmol/L (ref 3.5–5.1)
Sodium: 131 mmol/L — ABNORMAL LOW (ref 135–145)

## 2014-10-19 LAB — RENAL FUNCTION PANEL
ANION GAP: 6 (ref 5–15)
Albumin: 2.5 g/dL — ABNORMAL LOW (ref 3.5–5.2)
BUN: 42 mg/dL — ABNORMAL HIGH (ref 6–23)
CO2: 25 mmol/L (ref 19–32)
Calcium: 8 mg/dL — ABNORMAL LOW (ref 8.4–10.5)
Chloride: 101 mEq/L (ref 96–112)
Creatinine, Ser: 10.67 mg/dL — ABNORMAL HIGH (ref 0.50–1.10)
GFR calc Af Amer: 4 mL/min — ABNORMAL LOW (ref >90)
GFR calc non Af Amer: 3 mL/min — ABNORMAL LOW (ref >90)
Glucose, Bld: 79 mg/dL (ref 70–99)
POTASSIUM: 3.9 mmol/L (ref 3.5–5.1)
Phosphorus: 5.5 mg/dL — ABNORMAL HIGH (ref 2.3–4.6)
SODIUM: 132 mmol/L — AB (ref 135–145)

## 2014-10-19 MED ORDER — LEVOFLOXACIN 750 MG PO TABS
750.0000 mg | ORAL_TABLET | Freq: Once | ORAL | Status: AC
  Administered 2014-10-19: 750 mg via ORAL
  Filled 2014-10-19 (×2): qty 1

## 2014-10-19 MED ORDER — LEVOFLOXACIN 500 MG PO TABS: 500.0000 mg | ORAL_TABLET | ORAL | Status: DC

## 2014-10-19 MED ORDER — DEXTROSE 5 % IV SOLN
2.0000 g | INTRAVENOUS | Status: DC
  Filled 2014-10-19: qty 2

## 2014-10-19 MED ORDER — NEPRO/CARBSTEADY PO LIQD: 237.0000 mL | Freq: Two times a day (BID) | ORAL | Status: DC

## 2014-10-19 MED ORDER — VANCOMYCIN HCL 500 MG IV SOLR
500.0000 mg | INTRAVENOUS | Status: DC
  Filled 2014-10-19: qty 500

## 2014-10-19 MED ORDER — DARBEPOETIN ALFA 150 MCG/0.3ML IJ SOSY
120.0000 ug | PREFILLED_SYRINGE | INTRAMUSCULAR | Status: DC
  Administered 2014-10-19: 120 ug via INTRAVENOUS
  Filled 2014-10-19: qty 0.3

## 2014-10-19 MED ORDER — DARBEPOETIN ALFA 150 MCG/0.3ML IJ SOSY: 120.0000 ug | PREFILLED_SYRINGE | INTRAMUSCULAR | Status: DC

## 2014-10-19 NOTE — Progress Notes (Signed)
Pt seen and examined with Dr. Sherrine Maples. Case d/w residents in detail on morning rounds. I agree with findings and plan as documented in Dr. Sandi Raveling note.  Pt feels better today with improved breathing. No new complaints  Physical Exam: Gen: AAO*3, NAD CVS: RRR, normal heart sounds Pulm: b/l expiratory wheezes + R>L Abd: soft, non tender, BS + Ext: no edema  Assessment and Plan: 74 y/o female p/w SOB and cough and found to have HCAP and fluid overload likely from missed HD  HCAP: - d/c cefepime and vancomycin - Started on PO levaquin (renally dosed) to complete course - c/w nebs prn - Blood cx with NGTD  ESRD: - c/w HD per nephrology - Outpatient follow up with own dialysis center  Multiple Myeloma, Neutropenia: - Onc f/u appreciated - Neutropenia now resolved. Likely secondary to chemo - Granix dc'd - Pt with thrombocytopenia likely secondary to chemo. No indication for platelet transfusion at this time - [name] to resume chemo in AM at normally scheduled time  Pt is stable for d/c home today

## 2014-10-19 NOTE — Progress Notes (Addendum)
Patient arrived back to room from dialysis agitated verbalized that she doesn't;t feel well and that she has a headache but refusing tylenol. She stated "I don't know what I'm going to do or take". Staffs assisted patient back to bed. Patient then voice concerns that the bed is not comfortable and will have family hear about this. RN discussed discharge order with patient which appears to increase her agiatation. Patient verbalized that she doesn't feel good to go home but "it is not her choice" and will have to call brother for transportation. Student RN and staff RN assisted her with dinner and settle in her bed at this time. MD paged regarding her concerns about going home. Patient is aware that she has an appointment with oncologist tomorrow. Dr. Sherrine Maples aware of situation. Patient ok to go home today although still appears upset.    Ave Filter, RN

## 2014-10-19 NOTE — Evaluation (Signed)
Physical Therapy Evaluation Patient Details Name: [name] MRN: [id number] DOB: November 25, 1940 Today's Date: 10/19/2014   History of Present Illness  Pt is a 74 yo woman with a history of ESRD (T, R, Sa), multiple myeloma IIIB (diagnosed in 2009, currently on chemotherapy), hypertension, anemia in neoplastic disease and drug-induced neutropenia who presented with a three day history of productive cough and shortness of breath. She has had loose BMs for about a year. Unfortunately, she missed her Thursday and Saturday hemodialysis sessions because she felt too ill.  Clinical Impression  Pt admitted with above diagnosis. Pt currently with functional limitations due to the deficits listed below (see PT Problem List). At the time of PT eval pt was able to perform transfers and ambulation with min guard assist. Pt does not use a walker at baseline and declined use of AD during session today. Encouraged pt to consider using a RW for energy conservation and for walking out in the community. Pt adamant about returning home at d/c. Pt will benefit from skilled PT to increase their independence and safety with mobility to allow discharge to the venue listed below.       Follow Up Recommendations Home health PT;Supervision for mobility/OOB    Equipment Recommendations  Rolling walker with 5" wheels    Recommendations for Other Services       Precautions / Restrictions Precautions Precautions: Fall Restrictions Weight Bearing Restrictions: No      Mobility  Bed Mobility               General bed mobility comments: Pt received sitting on the [facility]  Transfers Overall transfer level: Needs assistance Equipment used: None Transfers: Sit to/from Stand Sit to Stand: Min guard         General transfer comment: Pt was able to power-up to full standing without assistance.   Ambulation/Gait Ambulation/Gait assistance: Min guard Ambulation Distance (Feet): 480 Feet Assistive device:  None Gait Pattern/deviations: Step-through pattern;Decreased stride length;Trunk flexed Gait velocity: Decreased Gait velocity interpretation: Below normal speed for age/gender General Gait Details: Pt was able to demonstrate ambulation without UE support 480 feet. No unsteadiness or LOB noted. Pt took 3 standing rest breaks due to fatigue, but was motivated for distance.   Stairs            Wheelchair Mobility    Modified Rankin (Stroke Patients Only)       Balance Overall balance assessment: Needs assistance Sitting-balance support: Feet supported;No upper extremity supported Sitting balance-Leahy Scale: Good     Standing balance support: No upper extremity supported;During functional activity Standing balance-Leahy Scale: Fair                               Pertinent Vitals/Pain Pain Assessment: No/denies pain    Home Living Family/patient expects to be discharged to:: Private residence Living Arrangements: Alone Available Help at Discharge: Family;Available PRN/intermittently Type of Home: Apartment Home Access: Level entry     Home Layout: One level Home Equipment: Cane - single point      Prior Function Level of Independence: Independent with assistive device(s)               Hand Dominance   Dominant Hand: Right    Extremity/Trunk Assessment   Upper Extremity Assessment: Defer to OT evaluation           Lower Extremity Assessment: Generalized weakness      Cervical / Trunk Assessment:  Kyphotic  Communication   Communication: No difficulties  Cognition Arousal/Alertness: Awake/alert Behavior During Therapy: WFL for tasks assessed/performed Overall Cognitive Status: History of cognitive impairments - at baseline                      General Comments      Exercises        Assessment/Plan    PT Assessment Patient needs continued PT services  PT Diagnosis Difficulty walking;Generalized weakness   PT  Problem List Decreased strength;Decreased range of motion;Decreased activity tolerance;Decreased balance;Decreased mobility;Decreased knowledge of use of DME;Decreased knowledge of precautions;Decreased safety awareness  PT Treatment Interventions DME instruction;Gait training;Stair training;Functional mobility training;Therapeutic activities;Therapeutic exercise;Neuromuscular re-education;Patient/family education   PT Goals (Current goals can be found in the Care Plan section) Acute Rehab PT Goals Patient Stated Goal: Return home and to her chemo/HD schedule PT Goal Formulation: With patient Time For Goal Achievement: 10/26/14 Potential to Achieve Goals: Good    Frequency Min 3X/week   Barriers to discharge        Co-evaluation               End of Session Equipment Utilized During Treatment: Gait belt Activity Tolerance: Patient tolerated treatment well Patient left: in chair;with call bell/phone within reach;Other (comment) (Pt declined chair alarm. Nursing aware. ) Nurse Communication: Mobility status         Time: 1594-7076 PT Time Calculation (min) (ACUTE ONLY): 42 min   Charges:   PT Evaluation $Initial PT Evaluation Tier I: 1 Procedure PT Treatments $Gait Training: 8-22 mins $Therapeutic Activity: 23-37 mins   PT G Codes:        Rolinda Roan 11/08/14, 1:46 PM  Rolinda Roan, PT, DPT Acute Rehabilitation Services Pager: [phone]

## 2014-10-19 NOTE — Care Management Note (Addendum)
CARE MANAGEMENT NOTE 10/19/2014  Patient:  Pederson,Joleene   Account Number:  [id number]  Date Initiated:  10/19/2014  Documentation initiated by:  Cyntia Staley  Subjective/Objective Assessment:   CM following for progression and d/c planning.     Action/Plan:   Met with pt no d/c needs identified at this time.   Anticipated DC Date:  10/20/2014   Anticipated DC Plan:  HOME/SELF CARE Rolling walker recommended by PT and pt agreeable for this to be ordered. RW ordered from [facility] for delivery to pt room.         Choice offered to / List presented to:             Status of service:   Medicare Important Message given?  YES (If response is "NO", the following Medicare IM given date fields will be blank) Date Medicare IM given:  10/19/2014 Medicare IM given by:  Charmon Thorson Date Additional Medicare IM given:   Additional Medicare IM given by:    Discharge Disposition:    Per UR Regulation:    If discussed at Long Length of Stay Meetings, dates discussed:    Comments:

## 2014-10-19 NOTE — Discharge Summary (Signed)
Name: [name] MRN: [id number] DOB: 1941-02-09 74 y.o. PCP: No Pcp Per Patient  Date of Admission: 10/16/2014 11:16 AM Date of Discharge: 10/19/2014 Attending Physician: Aldine Contes, MD  Discharge Diagnosis: Principal Problem:   HCAP (healthcare-associated pneumonia) Active Problems:   ESRD (end stage renal disease)   Multiple myeloma   Drug-induced neutropenia   SOB (shortness of breath)   Protein-calorie malnutrition, severe  Discharge Medications:   Medication List    STOP taking these medications        acyclovir 400 MG tablet  Commonly known as:  ZOVIRAX      TAKE these medications        dexamethasone 4 MG tablet  Commonly known as:  DECADRON  Take 3 tablets (12 mg) on days 1,8,15, and 22 of chemotherapy.     feeding supplement (NEPRO CARB STEADY) Liqd  Take 237 mLs by mouth 2 (two) times daily between meals.     furosemide 20 MG tablet  Commonly known as:  LASIX  Take 1 tablet (20 mg total) by mouth daily. PRN for leg edema     hydrALAZINE 100 MG tablet  Commonly known as:  APRESOLINE  Take 100 mg by mouth every 8 (eight) hours.     HYDROcodone-homatropine 5-1.5 MG/5ML syrup  Commonly known as:  HYCODAN  Take 5 mLs by mouth every 6 (six) hours as needed for cough.     isosorbide mononitrate 60 MG 24 hr tablet  Commonly known as:  IMDUR  Take 60 mg by mouth daily.     lenalidomide 2.5 MG capsule  Commonly known as:  REVLIMID  Take 1 capsule (2.5 mg total) by mouth daily.     levofloxacin 500 MG tablet  Commonly known as:  LEVAQUIN  Take 1 tablet (500 mg total) by mouth every other day.     LORazepam 0.5 MG tablet  Commonly known as:  ATIVAN  Take 1 tablet (0.5 mg total) by mouth every 6 (six) hours as needed (Nausea or vomiting).     ondansetron 8 MG tablet  Commonly known as:  ZOFRAN  Take 1 tablet (8 mg total) by mouth 2 (two) times daily. Start the day after chemo for 2 days. Then as needed for nausea or vomiting.     prochlorperazine 10 MG tablet  Commonly known as:  COMPAZINE  Take 1 tablet (10 mg total) by mouth every 6 (six) hours as needed (Nausea or vomiting).     vitamin C 500 MG tablet  Commonly known as:  ASCORBIC ACID  Take 500 mg by mouth daily.        Disposition and follow-up:   Ms.Bettey Fano was discharged from [facility] in Stable condition.  At the hospital follow up visit please address:  1.  HCAP: Please follow for resolution of symptoms. Patient was treated with cefepime/vancomycin in the hospital with good response and was discharged on renally dosed levaquin to complete course.   Multiple Myeloma and Neutropenia: Followed by [name]. Missed one chemotherapy session while an inpatient; was to be continued the day after discharge. Neutropenia resolved during hospitalization. No platelet or blood transfusions were necessary during this hospitalization.  2.  Labs / imaging needed at time of follow-up: none  3.  Pending labs/ test needing follow-up: none  Follow-up Appointments:     Follow-up Information    Follow up with [facility], [place], MD.   Specialty:  Hematology and Oncology   Why:  Normal chemo appointment 10/20/14.  On Monday you will have a lab appointment, [name] appointment and infusion   Contact information:   [place] [phone]       Follow up with Jerene Pitch, MD On 11/03/2014.   Specialty:  Internal Medicine   Why:  9:15AM    Contact information:   [address] [place] [id number] [phone]       Discharge Instructions:  Take the levaquin 500 mg antibiotic pill by mouth on the following days: Thursday, 10/21/14 Saturday, 10/23/14 Monday, 10/25/14 Wednesday, 10/27/14   The antibiotic will help fully treat your pneumonia. Please go to dialysis as scheduled (next on Thursday). Also, please go to your chemotherapy appointment tomorrow with [name]. She will also see you on Monday for labs,  an appointment with her, and a transfusion.  Here is some information about pneumonia:  Pneumonia Pneumonia is an infection of the lungs.  CAUSES Pneumonia may be caused by bacteria or a virus. Usually, these infections are caused by breathing infectious particles into the lungs (respiratory tract). SIGNS AND SYMPTOMS   Cough.  Fever.  Chest pain.  Increased rate of breathing.  Wheezing.  Mucus production. DIAGNOSIS  If you have the common symptoms of pneumonia, your health care provider will typically confirm the diagnosis with a chest X-ray. The X-ray will show an abnormality in the lung (pulmonary infiltrate) if you have pneumonia. Other tests of your blood, urine, or sputum may be done to find the specific cause of your pneumonia. Your health care provider may also do tests (blood gases or pulse oximetry) to see how well your lungs are working. TREATMENT  Some forms of pneumonia may be spread to other people when you cough or sneeze. You may be asked to wear a mask before and during your exam. Pneumonia that is caused by bacteria is treated with antibiotic medicine. Pneumonia that is caused by the influenza virus may be treated with an antiviral medicine. Most other viral infections must run their course. These infections will not respond to antibiotics.  HOME CARE INSTRUCTIONS   Cough suppressants may be used if you are losing too much rest. However, coughing protects you by clearing your lungs. You should avoid using cough suppressants if you can.  Your health care provider may have prescribed medicine if he or she thinks your pneumonia is caused by bacteria or influenza. Finish your medicine even if you start to feel better.  Your health care provider may also prescribe an expectorant. This loosens the mucus to be coughed up.  Take medicines only as directed by your health care provider.  Do not smoke. Smoking is a common cause of bronchitis and can contribute to pneumonia.  If you are a smoker and continue to smoke, your cough may last several weeks after your pneumonia has cleared.  A cold steam vaporizer or humidifier in your room or home may help loosen mucus.  Coughing is often worse at night. Sleeping in a semi-upright position in a recliner or using a couple pillows under your head will help with this.  Get rest as you feel it is needed. Your body will usually let you know when you need to rest. PREVENTION A pneumococcal shot (vaccine) is available to prevent a common bacterial cause of pneumonia. This is usually suggested for:  People over 65 years old.  Patients on chemotherapy.  People with chronic lung problems, such as bronchitis or emphysema.  People with immune system problems. If  you are over 65 or have a high risk condition, you may receive the pneumococcal vaccine if you have not received it before. In some countries, a routine influenza vaccine is also recommended. This vaccine can help prevent some cases of pneumonia.You may be offered the influenza vaccine as part of your care. If you smoke, it is time to quit. You may receive instructions on how to stop smoking. Your health care provider can provide medicines and counseling to help you quit. SEEK MEDICAL CARE IF: You have a fever. SEEK IMMEDIATE MEDICAL CARE IF:   Your illness becomes worse. This is especially true if you are elderly or weakened from any other disease.  You cannot control your cough with suppressants and are losing sleep.  You begin coughing up blood.  You develop pain which is getting worse or is uncontrolled with medicines.  Any of the symptoms which initially brought you in for treatment are getting worse rather than better.  You develop shortness of breath or chest pain. MAKE SURE YOU:   Understand these instructions.  Will watch your condition.  Will get help right away if you are not doing well or get worse. Document Released: 09/17/2005 Document  Revised: 02/01/2014 Document Reviewed: 12/07/2010 [facility] Patient Information 2015 [place], [place]. This information is not intended to replace advice given to you by your health care provider. Make sure you discuss any questions you have with your health care provider.   Consultations: Treatment Team:  Sol Blazing, MD Heath Lark, MD  Procedures Performed:  Dg Chest Port 1 View  10/16/2014   CLINICAL DATA:  74 year old female with progressive shortness of breath and cough over the past 2 days.  EXAM: PORTABLE CHEST - 1 VIEW  COMPARISON:  Prior bone survey including frontal view of the chest 03/12/2014  FINDINGS: The patient is slightly rotated toward the right. Increasing enlargement of the cardiopericardial silhouette. Atherosclerotic and mildly tortuous thoracic aorta. Mild vascular congestion without overt edema. Focal patchy airspace opacity in the right upper lung represents an interval finding. The lungs are hyperinflated. Mild central bronchitic change. Surgical clips are noted in the soft tissues of the medial left upper extremity. No acute osseous abnormality.  IMPRESSION: 1. Focal patchy airspace opacity in the right upper lobe may reflect bronchopneumonia. 2. Progressive enlargement of the cardiopericardial silhouette which may represent increasing cardiomegaly and/or pericardial effusion. 3. Slightly increased pulmonary vascular congestion without evidence of edema.   Electronically Signed   By: Jacqulynn Cadet M.D.   On: 10/16/2014 12:57   Admission HPI: Ms. Anjani Feuerborn is a 74 yo woman with a history of ESRD (T, R, Sa), multiple myeloma IIIB (diagnosed in 2009, currently on chemotherapy), hypertension, anemia in neoplastic disease and drug-induced neutropenia who presented with a three day history of productive cough and shortness of breath. Her sputum has been white. She also vomited once and has a decreased appetite but normal PO intake. She has had loose BMs for about a year.  She denies chills, fever, muscle aches or sick contacts. She did get a flu shot this year. Unfortunately, she missed her Thursday and Saturday hemodialysis sessions because she felt too ill.  She is followed by Dr. Clemon Chambers for her MM. She has been on chemotherapy, Revlemid, dexamethasone and G-CSF for neutropenia.   Hospital Course by problem list: Principal Problem:   HCAP (healthcare-associated pneumonia) Active Problems:   ESRD (end stage renal disease)   Multiple myeloma   Drug-induced neutropenia   SOB (shortness of  breath)   Protein-calorie malnutrition, severe   Ms. Skorupski is a 74 yo woman admitted for HCAP treatment. Her recent symptoms of URI caused her to miss HD. She received 2 sessions of HD in the hospital and was treated for HCAP; she appeared to quickly respond to initiation of antibiotics and reinstatement of HD.  HCAP: RML infiltrate on CXR and recent h/o cough and SOB. No clear evidence of volume overload despite missing one HD session. Initially started on Vancomycin + Cefepime in ED. Flu, c diff negative, strep pneumo and legionella antigens negative. Blood cultures had NGTD for 5 days and sputum cultures was not an adequate sample. Patient was transitioned to levaquin 750 mg by mouth at discharge.  ESRD: Receives HD T/R/Sa. Missed one session, but completed 2 in the hospital. Normally follows at [facility].  Slightly Elevated Troponin: 0.12-->0.13 Likely secondary to ESRD. Patient asymptomatic with normal EKG.  Diarrhea: Patient reports >1 year of loose BMs that have continued in the hospital. It is very possible that Kyprolis and Revlimid are causing her diarrhea (occurs in 33 and 50% of patients on these agents, respectively). C diff negative. Patient ate well during this hospitalization (80% of meals).  Multiple Myeloma IIIB: Diagnosed in 2009 and on chemotherapy. Follows w/ [name]. Has been on chemo + Revlemid + Dexamethasone. Has required G-CSF for neutropenia,  given every Friday. Gave Granix (at 28mg/kg dose) until ACitronelle>1.0. ANC 0.9-->1.4--> (stopped Granix)-->2.1. Held revlimid and dexamethasone. Patient missed one chemo treatment during this hospitalization, but Dr. GAlvy Bimlerto see the patient the day after discharge for continuation of treatment.  Hypertension: Maintained in the 120s over 70s on home hydralazine 100 mg TID and Imdur 60 mg daily.  Anemia in Neoplastic Disease: Hemoglobin 9.9 (baseline 10.3). Asymptomatic. Plan was to transfuse to maintain hgb 8.0 or if patient actively bleeds (but this was not necessary).  Drug-Induced Neutropenia: WBC up to 4.0. Baseline is low due to recent treatments. Asymptomatic. To continue chemotherapy without adjustment per recent onc note. May have to delay therapy in future if continues to drop. Plan was to transfuse 1 unit platelets if count <10,000 or 20,000 if patient acutely bleeds (but this was not necessary).  History of Thyroid Disease: TSH negative  Discharge Vitals:   BP 123/69 mmHg  Pulse 61  Temp(Src) 97.7 F (36.5 C) (Oral)  Resp 18  Ht '5\' 3"'  (1.6 m)  Wt 130 lb 8.2 oz (59.2 kg)  BMI 23.13 kg/m2  SpO2 97%  Discharge Labs:  Results for orders placed or performed during the hospital encounter of 10/16/14 (from the past 24 hour(s))  CBC with Differential     Status: Abnormal   Collection Time: 10/19/14  4:41 AM  Result Value Ref Range   WBC 4.0 4.0 - 10.5 K/uL   RBC 3.18 (L) 3.87 - 5.11 MIL/uL   Hemoglobin 9.9 (L) 12.0 - 15.0 g/dL   HCT 31.7 (L) 36.0 - 46.0 %   MCV 99.7 78.0 - 100.0 fL   MCH 31.1 26.0 - 34.0 pg   MCHC 31.2 30.0 - 36.0 g/dL   RDW 15.8 (H) 11.5 - 15.5 %   Platelets 79 (L) 150 - 400 K/uL   Neutrophils Relative % 53 43 - 77 %   Neutro Abs 2.1 1.7 - 7.7 K/uL   Lymphocytes Relative 29 12 - 46 %   Lymphs Abs 1.2 0.7 - 4.0 K/uL   Monocytes Relative 13 (H) 3 - 12 %   Monocytes Absolute  0.5 0.1 - 1.0 K/uL   Eosinophils Relative 5 0 - 5 %   Eosinophils Absolute 0.2 0.0 -  0.7 K/uL   Basophils Relative 0 0 - 1 %   Basophils Absolute 0.0 0.0 - 0.1 K/uL  Renal function panel     Status: Abnormal   Collection Time: 10/19/14  4:41 AM  Result Value Ref Range   Sodium 132 (L) 135 - 145 mmol/L   Potassium 3.9 3.5 - 5.1 mmol/L   Chloride 101 96 - 112 mEq/L   CO2 25 19 - 32 mmol/L   Glucose, Bld 79 70 - 99 mg/dL   BUN 42 (H) 6 - 23 mg/dL   Creatinine, Ser 10.67 (H) 0.50 - 1.10 mg/dL   Calcium 8.0 (L) 8.4 - 10.5 mg/dL   Phosphorus 5.5 (H) 2.3 - 4.6 mg/dL   Albumin 2.5 (L) 3.5 - 5.2 g/dL   GFR calc non Af Amer 3 (L) >90 mL/min   GFR calc Af Amer 4 (L) >90 mL/min   Anion gap 6 5 - 15  Basic metabolic panel     Status: Abnormal   Collection Time: 10/19/14 12:45 PM  Result Value Ref Range   Sodium 131 (L) 135 - 145 mmol/L   Potassium 3.9 3.5 - 5.1 mmol/L   Chloride 96 96 - 112 mEq/L   CO2 24 19 - 32 mmol/L   Glucose, Bld 89 70 - 99 mg/dL   BUN 46 (H) 6 - 23 mg/dL   Creatinine, Ser 11.03 (H) 0.50 - 1.10 mg/dL   Calcium 8.3 (L) 8.4 - 10.5 mg/dL   GFR calc non Af Amer 3 (L) >90 mL/min   GFR calc Af Amer 3 (L) >90 mL/min   Anion gap 11 5 - 15    Signed: Drucilla Schmidt, MD 10/19/2014, 3:28 PM    Services Ordered on Discharge: none (patient refused home health services, namely, PT) Equipment Ordered on Discharge: rolling walker with 5" wheels

## 2014-10-19 NOTE — Discharge Instructions (Signed)
Take the levaquin 500 mg antibiotic pill by mouth on the following days: Thursday, 10/21/14 Saturday, 10/23/14 Monday, 10/25/14 Wednesday, 10/27/14   The antibiotic will help fully treat your pneumonia. Please go to dialysis as scheduled (next on Thursday). Also, please go to your chemotherapy appointment tomorrow with [name]. She will also see you on Monday for labs, an appointment with her, and a transfusion.  Here is some information about pneumonia:  Pneumonia Pneumonia is an infection of the lungs.  CAUSES Pneumonia may be caused by bacteria or a virus. Usually, these infections are caused by breathing infectious particles into the lungs (respiratory tract). SIGNS AND SYMPTOMS   Cough.  Fever.  Chest pain.  Increased rate of breathing.  Wheezing.  Mucus production. DIAGNOSIS  If you have the common symptoms of pneumonia, your health care provider will typically confirm the diagnosis with a chest X-ray. The X-ray will show an abnormality in the lung (pulmonary infiltrate) if you have pneumonia. Other tests of your blood, urine, or sputum may be done to find the specific cause of your pneumonia. Your health care provider may also do tests (blood gases or pulse oximetry) to see how well your lungs are working. TREATMENT  Some forms of pneumonia may be spread to other people when you cough or sneeze. You may be asked to wear a mask before and during your exam. Pneumonia that is caused by bacteria is treated with antibiotic medicine. Pneumonia that is caused by the influenza virus may be treated with an antiviral medicine. Most other viral infections must run their course. These infections will not respond to antibiotics.  HOME CARE INSTRUCTIONS   Cough suppressants may be used if you are losing too much rest. However, coughing protects you by clearing your lungs. You should avoid using cough suppressants if you can.  Your health care provider may have prescribed medicine if he or  she thinks your pneumonia is caused by bacteria or influenza. Finish your medicine even if you start to feel better.  Your health care provider may also prescribe an expectorant. This loosens the mucus to be coughed up.  Take medicines only as directed by your health care provider.  Do not smoke. Smoking is a common cause of bronchitis and can contribute to pneumonia. If you are a smoker and continue to smoke, your cough may last several weeks after your pneumonia has cleared.  A cold steam vaporizer or humidifier in your room or home may help loosen mucus.  Coughing is often worse at night. Sleeping in a semi-upright position in a recliner or using a couple pillows under your head will help with this.  Get rest as you feel it is needed. Your body will usually let you know when you need to rest. PREVENTION A pneumococcal shot (vaccine) is available to prevent a common bacterial cause of pneumonia. This is usually suggested for:  People over 66 years old.  Patients on chemotherapy.  People with chronic lung problems, such as bronchitis or emphysema.  People with immune system problems. If you are over 65 or have a high risk condition, you may receive the pneumococcal vaccine if you have not received it before. In some countries, a routine influenza vaccine is also recommended. This vaccine can help prevent some cases of pneumonia.You may be offered the influenza vaccine as part of your care. If you smoke, it is time to quit. You may receive instructions on how to stop smoking. Your health care provider can provide medicines  and counseling to help you quit. SEEK MEDICAL CARE IF: You have a fever. SEEK IMMEDIATE MEDICAL CARE IF:   Your illness becomes worse. This is especially true if you are elderly or weakened from any other disease.  You cannot control your cough with suppressants and are losing sleep.  You begin coughing up blood.  You develop pain which is getting worse or is  uncontrolled with medicines.  Any of the symptoms which initially brought you in for treatment are getting worse rather than better.  You develop shortness of breath or chest pain. MAKE SURE YOU:   Understand these instructions.  Will watch your condition.  Will get help right away if you are not doing well or get worse. Document Released: 09/17/2005 Document Revised: 02/01/2014 Document Reviewed: 12/07/2010 [facility] Patient Information 2015 [place], [place]. This information is not intended to replace advice given to you by your health care provider. Make sure you discuss any questions you have with your health care provider.

## 2014-10-19 NOTE — Progress Notes (Signed)
Discharge instruction reviewed with patient. All questions answered at this time. Awaiting for transport from family.  Ave Filter, RN

## 2014-10-19 NOTE — Progress Notes (Signed)
MD ok to d/c isolation at this time. HD called and confirmed that patient will be getting dialysis treatment today.   Ave Filter, RN

## 2014-10-19 NOTE — Progress Notes (Signed)
Patient appears very upset this AM regarding her mattresses and stated "my insurance ain't paying for me to sleep in this thing" RN and staffs stay and comfort patient. Patient appreciated concerns and calm after ward. Patient is now eating breakfast with no other distress or concerns.   Ave Filter, RN

## 2014-10-19 NOTE — Progress Notes (Signed)
OT cancellation    10/19/14 1453  OT Visit Information  Last OT Received On 10/19/14  Reason Eval/Treat Not Completed Patient at procedure or test/ unavailable. Pt at HD   [facility], OTR/L [phone]

## 2014-10-19 NOTE — Progress Notes (Signed)
Subjective:  Agrees to HD today   Objective Vital signs in last 24 hours: Filed Vitals:   10/18/14 0848 10/18/14 1724 10/18/14 2109 10/19/14 0615  BP: 125/71 129/72 115/72 152/82  Pulse: 64 53 57 57  Temp: 98.3 F (36.8 C) 98.5 F (36.9 C) 98 F (36.7 C) 97.9 F (36.6 C)  TempSrc: Oral Oral Oral Axillary  Resp: '16 16 15 18  ' Height:      Weight:      SpO2: 97% 97% 97% 97%   Weight change:   Physical Exam: General: alert, nad Heart: RRR, , no rub or murogallop Lungs: Bilat scattered Rales/crackles Abdomen:  bs , soft nt , nd, Extremities: No pedal edema Dialysis Access: Lua avf pos bruit   OP HD: TTS  3h 16mn 2/2.25 [place], EDW- 57.5kg (previous was 60.5kg) AVF LUA Heparin none Aranesp 120 ug / Tues Hectorol 7 ug  Problem/Plan: 1. HCAP- on abx per primary svc 2. ESRD- normally TTS  Agrees today was scheduled for extra treatment  Yest. due to vol excess, however pt was refusing 3. SOB- mainly due to PNA but also with volume overload (has been losing weight and is well below her EDW). She refused an extra treatment yeasterday but will cont to challenge EDW with HD today 4. Neutropenia- Granix per primary svc/ onco 5. Multiple myeloma- hold chemo and ask Heme/Onc saw pt yest  (per pt request) 6. Anemia:- sl drop to 9.9 continue Aranesp as op  7. CKD-MBD: Phos improving 8. Nutrition: protein malnutrition and poor appetite, likely related to advanced age and myeloma 976 Hypertension:stable with hd vol and meds   DErnest Haber PA-C [facility] [phone]/19/2016,9:14 AM  LOS: 3 days   Labs: Basic Metabolic Panel:  Recent Labs Lab 10/16/14 1700 10/17/14 0257 10/18/14 0555 10/19/14 0441  NA  --  134* 131* 132*  K  --  4.0 3.5 3.9  CL  --  98 95* 101  CO2  --  '26 26 25  ' GLUCOSE  --  94 105* 79  BUN  --  23 32* 42*  CREATININE  --  6.65* 8.93* 10.67*  CALCIUM  --  8.2* 8.1* 8.0*  PHOS 8.5*  --  5.9* 5.5*   Liver Function  Tests:  Recent Labs Lab 10/18/14 0555 10/19/14 0441  ALBUMIN 2.6* 2.5*   No results for input(s): LIPASE, AMYLASE in the last 168 hours. No results for input(s): AMMONIA in the last 168 hours. CBC:  Recent Labs Lab 10/16/14 1200  10/17/14 0257 10/18/14 0555 10/19/14 0441  WBC 1.5*  --  1.5* 2.6* 4.0  NEUTROABS 0.9*  --  0.9* 1.4* 2.1  HGB 10.3*  < > 10.1* 10.6* 9.9*  HCT 32.0*  < > 32.4* 34.1* 31.7*  MCV 99.1  --  100.3* 102.7* 99.7  PLT 105*  --  87* 80* 79*  < > = values in this interval not displayed. Cardiac Enzymes:  Recent Labs Lab 10/16/14 1700 10/17/14 0251  TROPONINI 0.12* 0.13*   CBG: No results for input(s): GLUCAP in the last 168 hours.  Studies/Results: No results found. Medications:   . calcium acetate  667 mg Oral TID WC  . doxercalciferol  7 mcg Intravenous Q T,Th,Sa-HD  . feeding supplement (NEPRO CARB STEADY)  237 mL Oral BID BM  . feeding supplement (PRO-STAT SUGAR FREE 64)  30 mL Oral Daily  . hydrALAZINE  100 mg Oral 3 times per day  . isosorbide mononitrate  60  mg Oral Daily      I have seen and examined this patient and agree with plan as outlined by Ernest Haber, PA-C. Jeslyn Amsler A,MD 10/19/2014 11:18 AM

## 2014-10-19 NOTE — Progress Notes (Signed)
ANTIBIOTIC CONSULT NOTE   Pharmacy Consult for Vancomycin & Cefepime Indication: HCAP  No Known Allergies  Patient Measurements: Height: '5\' 3"'  (160 cm) Weight: 128 lb (58.06 kg) IBW/kg (Calculated) : 52.4 TBW: 63 kg  Vital Signs: Temp: 98 F (36.7 C) (01/19 0957) Temp Source: Oral (01/19 0957) BP: 141/80 mmHg (01/19 0957) Pulse Rate: 60 (01/19 0957) Intake/Output from previous day: 01/18 0701 - 01/19 0700 In: 760 [P.O.:760] Out: -  Intake/Output from this shift: Total I/O In: 270 [P.O.:270] Out: -   Labs:  Recent Labs  10/17/14 0257 10/18/14 0555 10/19/14 0441  WBC 1.5* 2.6* 4.0  HGB 10.1* 10.6* 9.9*  PLT 87* 80* 79*  CREATININE 6.65* 8.93* 10.67*   Estimated Creatinine Clearance: 3.8 mL/min (by C-G formula based on Cr of 10.67). No results for input(s): VANCOTROUGH, VANCOPEAK, VANCORANDOM, GENTTROUGH, GENTPEAK, GENTRANDOM, TOBRATROUGH, TOBRAPEAK, TOBRARND, AMIKACINPEAK, AMIKACINTROU, AMIKACIN in the last 72 hours.   Microbiology: Recent Results (from the past 720 hour(s))  Culture, blood (routine x 2)      Status: None (Preliminary result)   Collection Time: 10/16/14  1:37 PM  Result Value Ref Range Status   Specimen Description BLOOD RIGHT ARM  Final   Special Requests BOTTLES DRAWN AEROBIC AND ANAEROBIC 4CC  Final   Culture   Final           BLOOD CULTURE RECEIVED NO GROWTH TO DATE CULTURE WILL BE HELD FOR 5 DAYS BEFORE ISSUING A FINAL NEGATIVE REPORT Performed at [facility]    Report Status PENDING  Incomplete  Culture, blood (routine x 2)      Status: None (Preliminary result)   Collection Time: 10/16/14  1:42 PM  Result Value Ref Range Status   Specimen Description BLOOD RIGHT ARM  Final   Special Requests BOTTLES DRAWN AEROBIC AND ANAEROBIC 5CC  Final   Culture   Final           BLOOD CULTURE RECEIVED NO GROWTH TO DATE CULTURE WILL BE HELD FOR 5 DAYS BEFORE ISSUING A FINAL NEGATIVE REPORT Performed at [facility]    Report  Status PENDING  Incomplete  Culture, expectorated sputum-assessment     Status: None   Collection Time: 10/17/14  8:17 AM  Result Value Ref Range Status   Specimen Description SPUTUM  Final   Special Requests NONE  Final   Sputum evaluation   Final    MICROSCOPIC FINDINGS SUGGEST THAT THIS SPECIMEN IS NOT REPRESENTATIVE OF LOWER RESPIRATORY SECRETIONS. PLEASE RECOLLECT. CALLED A.MINTZ,RN 10/17/14 @ 1300 BY V.WILKINS   Report Status 10/17/2014 FINAL  Final  Clostridium Difficile by PCR     Status: None   Collection Time: 10/17/14  9:25 AM  Result Value Ref Range Status   C difficile by pcr NEGATIVE NEGATIVE Final    Medical History: Past Medical History  Diagnosis Date  . Renal disorder   . ESRD (end stage renal disease)   . Hypertension   . Multiple myeloma 2009  . Thyroid disease   . Hyperparathyroidism   . Thrombocytopenia   . Anemia   . Shortness of breath   . Pneumonia   . GERD (gastroesophageal reflux disease)   . Arthritis     rt knee is stiff   Assessment: 74 yo F presented on 1/16 with diarrhea. CXR found to be remarkable for R patchy infiltrates. PMH significant for ESRD, HTN, multiple myeloma, thrombocytopenia. HD Schedule: TThS. Pt will be getting HD today (1/19).   WBC wnl, currently afebrile  1/17 BCx >> NGTD  Goal of Therapy:  Eradication of infection Vanc pre-HD level 15-25 mcg/mL  Vanc post-HD level 5-15 mcg/mL   Plan:  D2: Vancomycin 561m IV post HD  D3: Cefepime 2 g IV post HD   Monitor clinical picture, VR prn, C&S, LOT  REnid Skeens PharmD Student 10/19/2014,10:21 AM

## 2014-10-19 NOTE — Progress Notes (Signed)
Subjective: [name] feels "half my age" today. She thinks she is breathing well. She feels that she is ready to go home, especially given that she could catch an infection in the hospital.  Objective: Vital signs in last 24 hours: Filed Vitals:   10/18/14 1724 10/18/14 2109 10/19/14 0615 10/19/14 0957  BP: 129/72 115/72 152/82 141/80  Pulse: 53 57 57 60  Temp: 98.5 F (36.9 C) 98 F (36.7 C) 97.9 F (36.6 C) 98 F (36.7 C)  TempSrc: Oral Oral Axillary Oral  Resp: _0 Height:      Weight:      SpO2: 97% 97% 97% 97%   Weight change:   Intake/Output Summary (Last 24 hours) at 10/19/14 1331 Last data filed at 10/19/14 0900  Gross per 24 hour  Intake    390 ml  Output      0 ml  Net    390 ml   Physical Exam: Appearance: in NAD in bed, no coughing during exam today, no supplemental oxygen, empty breakfast tray at bedside HEENT: AT/[place], PERRL, EOMi, no ocular discharge, MMM Heart: RRR, normal S1S2, no MRG, +JVD Lungs: improved breath sounds, faint expiratory wheezing R>L, normal inspiratory effort Abdomen: BS+, soft, nontender to palpation Musculoskeletal: no LE edema b/l, left access Neurologic: A&Ox3, grossly intact Skin: no rashes or lesions  Lab Results: Basic Metabolic Panel:  Recent Labs Lab 10/18/14 0555 10/19/14 0441  NA 131* 132*  K 3.5 3.9  CL 95* 101  CO2 26 25  GLUCOSE 105* 79  BUN 32* 42*  CREATININE 8.93* 10.67*  CALCIUM 8.1* 8.0*  PHOS 5.9* 5.5*   CBC:  Recent Labs Lab 10/18/14 0555 10/19/14 0441  WBC 2.6* 4.0  NEUTROABS 1.4* 2.1  HGB 10.6* 9.9*  HCT 34.1* 31.7*  MCV 102.7* 99.7  PLT 80* 79*   Cardiac Enzymes:  Recent Labs Lab 10/16/14 1700 10/17/14 0251  TROPONINI 0.12* 0.13*   Thyroid Function Tests:  Recent Labs Lab 10/17/14 0251  TSH 0.954   Micro Results: Recent Results (from the past 240 hour(s))  Culture, blood (routine x 2)      Status: None (Preliminary result)   Collection Time: 10/16/14  1:37 PM    Result Value Ref Range Status   Specimen Description BLOOD RIGHT ARM  Final   Special Requests BOTTLES DRAWN AEROBIC AND ANAEROBIC 4CC  Final   Culture   Final           BLOOD CULTURE RECEIVED NO GROWTH TO DATE CULTURE WILL BE HELD FOR 5 DAYS BEFORE ISSUING A FINAL NEGATIVE REPORT Performed at [facility]    Report Status PENDING  Incomplete  Culture, blood (routine x 2)      Status: None (Preliminary result)   Collection Time: 10/16/14  1:42 PM  Result Value Ref Range Status   Specimen Description BLOOD RIGHT ARM  Final   Special Requests BOTTLES DRAWN AEROBIC AND ANAEROBIC 5CC  Final   Culture   Final           BLOOD CULTURE RECEIVED NO GROWTH TO DATE CULTURE WILL BE HELD FOR 5 DAYS BEFORE ISSUING A FINAL NEGATIVE REPORT Performed at [facility]    Report Status PENDING  Incomplete  Culture, expectorated sputum-assessment     Status: None   Collection Time: 10/17/14  8:17 AM  Result Value Ref Range Status   Specimen Description SPUTUM  Final   Special Requests NONE  Final   Sputum  evaluation   Final    MICROSCOPIC FINDINGS SUGGEST THAT THIS SPECIMEN IS NOT REPRESENTATIVE OF LOWER RESPIRATORY SECRETIONS. PLEASE RECOLLECT. CALLED A.MINTZ,RN 10/17/14 @ 1300 BY V.WILKINS   Report Status 10/17/2014 FINAL  Final  Clostridium Difficile by PCR     Status: None   Collection Time: 10/17/14  9:25 AM  Result Value Ref Range Status   C difficile by pcr NEGATIVE NEGATIVE Final   Studies/Results: No results found. Medications: I have reviewed the patient's current medications. Scheduled Meds: . calcium acetate  667 mg Oral TID WC  . darbepoetin (ARANESP) injection - DIALYSIS  120 mcg Intravenous Q Tue-HD  . doxercalciferol  7 mcg Intravenous Q T,Th,Sa-HD  . feeding supplement (NEPRO CARB STEADY)  237 mL Oral BID BM  . feeding supplement (PRO-STAT SUGAR FREE 64)  30 mL Oral Daily  . hydrALAZINE  100 mg Oral 3 times per day  . isosorbide mononitrate  60 mg Oral Daily  .  levofloxacin  750 mg Oral Once   Continuous Infusions:  PRN Meds:.ipratropium, LORazepam Assessment/Plan: Principal Problem:   HCAP (healthcare-associated pneumonia) Active Problems:   ESRD (end stage renal disease)   Multiple myeloma   Drug-induced neutropenia   SOB (shortness of breath)   Protein-calorie malnutrition, severe  Ms. Bellissimo is a 74 yo woman admitted for HCAP treatment. Her recent symptoms of URI caused her to miss HD. She received HD on 1/16 and again today. She appears to be responding to initiation of antibiotics and reinstatement of HD and feels much better.  HCAP: Patient w/ RML infiltrate on CXR and recent h/o cough and SOB. No clear evidence of volume overload despite missing one HD session. Initially started on Vancomycin + Cefepime in ED. Flu, c diff negative, strep pneumo and legionella antigens negative.  - Transitioned to PO levaquin 750 mg today - Atrovent nebs - Sputum culture needs to be recollected, blood cultures NGTD - O2 PRN for O2 sat >92%; patient not requiring  ESRD: Receives HD T/R/Sa. Missed on R, completed on Sa and receiving it today. [facility]. - Appreciate renal consult  Slightly Elevated Troponin: 0.12-->0.13 Likely secondary to ESRD. Patient asymptomatic with normal EKG.  Diarrhea: Patient reports >1 year of loose BMs that have continued in the hospital. It is very possible that Kyprolis and Revlimid are causing her diarrhea (occurs in 33 and 50% of patients on these agents, respectively). C diff negative. 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 Follows w/ [name], who will visit patient if still admitted Monday. Has been on chemo + Revlemid + Dexamethasone. Has required G-CSF for neutropenia, given every Friday. Discussed w/ Dr. Lindi Adie (onc), will hold MM medications and give Granix (at 29mg/kg dose)  until [place]>1.0. ANC 0.9-->1.4-->2.1. Patient was scheduled for chemo treatment yesterday. Spoke with Dr. GAlvy Bimlerwho will resume chemo tomorrow at normally scheduled time; will add on appointment with patient between labs and infusion on Monday. - Holding Revlimid and dexamethasone - Holding Granix 300 mcg today (ANC>1.0)    Hypertension: Currently 129/69. 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 Asymptomatic. Does not require transfusion or erythropoietin stimulating agent now per recent oncology note. - Per onc, transfuse to maintain hgb 8.0 or if patient actively bleeds  Drug-Induced Neutropenia: WBC up  to 4.0. Baseline is low due to recent treatments. Asymptomatic. To continue chemotherapy without adjustment per recent onc note. May have to delay therapy in future if continues to drop. - Transfuse 1 unit platelets if count <10,000 or 20,000 if patient acutely bleeds  History of Thyroid Disease:  - TSH negative  Diet: Patient has malnutrition - Nutrition evaluation - Renal  DVT Ppx: - Stopped heparin (patient has chronic thrombocytosis) - SCDs - PT recommends rolling walker with 5" wheels - Encourage OOB  Dispo: Disposition is deferred at this time, awaiting improvement of current medical problems.  Anticipated discharge in approximately 0 day(s).   The patient does not have a current PCP (No Pcp Per Patient) and does need an OPC hospital follow-up appointment after discharge.  The patient does not have transportation limitations that hinder transportation to clinic appointments.  .Services Needed at time of discharge: Y = Yes, Blank = No PT:   OT:   RN:   Equipment:   Other:     LOS: 3 days    , MD 10/19/2014, 1:31 PM 

## 2014-10-20 ENCOUNTER — Other Ambulatory Visit: Payer: Self-pay | Admitting: *Deleted

## 2014-10-20 ENCOUNTER — Encounter: Payer: Self-pay | Admitting: *Deleted

## 2014-10-20 ENCOUNTER — Other Ambulatory Visit: Payer: Self-pay | Admitting: Hematology and Oncology

## 2014-10-20 ENCOUNTER — Ambulatory Visit: Payer: Medicare Other

## 2014-10-20 NOTE — Progress Notes (Signed)
Pt. Discharged from hospital yesterday . Had been admitted with pneumonia. Pt. States she still feels very weak and needed 2 person assist to get to chair in treatment area.  Pt. States she needs to rest and wants to see [name] this morning and ask if she really needs treatment today. Has hemodialysis tomorrow.  VS taken and informed Cameo, RN and [name] of pt's request.  Treatment cancelled for today per [name].  Cameo, RN  Came to speak to pt.  Also pt. Requested to speak with SW Lauren about transportation issues.  Lauren, SW came to speak with pt. And help with appropriate transportation arrangements.  Pt. Did receive lunch and beverage before she left for the day.  Re-scheduled for Monday and appt. With [name].

## 2014-10-20 NOTE — Progress Notes (Signed)
Informed [name] pt is not feeling well.  Pt says she doesn't think she is recovered enough from her pneumonia to get chemo today.   [name] says ok for pt to skip treatment today if she is not feeling well.  Informed pt of this.  She voiced some frustration that she "came in for nothing."   Discussed w/ pt she is ok to get treatment today per [name] but if she really feels bad and thinks it will make her feel worse than she is also ok to skip it today.  Informed pt she is scheduled to see [name] on Monday 1/25 before her next treatment.  She verbalized understanding and says she doesn't want treatment today.  She thinks she needs a few more days to recover.

## 2014-10-20 NOTE — Progress Notes (Signed)
[place] Clinical Social Work  Clinical Social Work requested transportation for the following appointments through [place]:   S 10/20/2014 Wed 11:00 A(Ar*) 120 INFUSION 2 HR [1452] [facility] H30 [id number] [facility] [id number] kypro  PGR 347     S 10/22/2014 Fri 11:30 A 15 INJECTION [1081] [facility] INJ NURSE [id number] [facility] [id number]      S 10/25/2014 Mon 11:00 A 15 LAB MO [1477] [facility] LAB 4 [id number] [facility] [id number]       10/25/2014 Mon 11:15 A 15 EST PT 15 [330] [place], [place] [id number] [facility] [id number] d.t per staff     S 10/25/2014 Mon 11:45 A 120 INFUSION 2 HR [1452] [facility] G24 [id number] [facility] [id number] kypro     S 10/27/2014 Wed 11:30 A 120 INFUSION 2 HR [1452] [facility] G24 [id number] [facility] [id number] kypro     S 10/29/2014 Fri 11:30 A 15 INJECTION [1081] [facility] INJ NURSE [id number] [facility] [id number]      S 11/01/2014 Mon 11:00 A 15 LAB MO [1477] [facility] LAB 4 [id number] [facility] [id number]      S 11/01/2014 Mon 11:45 A 120 INFUSION 2 HR [1452] [facility] C8 [id number] [facility] [id number] kypro          S 11/03/2014 Wed 11:30 A 120 INFUSION 2 HR [1452] [facility] A2 [id number] [facility] [id number] kypro     S 11/05/2014 Fri 11:45 A 15 INJECTION [1081] [facility] INJ NURSE [id number] [facility] [id number]       Polo Riley, MSW, LCSW, OSW-C Clinical Social Worker [place] [phone]

## 2014-10-22 ENCOUNTER — Ambulatory Visit: Payer: Self-pay

## 2014-10-22 LAB — CULTURE, BLOOD (ROUTINE X 2)
CULTURE: NO GROWTH
Culture: NO GROWTH

## 2014-10-25 ENCOUNTER — Ambulatory Visit: Payer: Self-pay

## 2014-10-25 ENCOUNTER — Telehealth: Payer: Self-pay | Admitting: *Deleted

## 2014-10-25 ENCOUNTER — Ambulatory Visit: Payer: Self-pay | Admitting: Hematology and Oncology

## 2014-10-25 ENCOUNTER — Other Ambulatory Visit: Payer: Self-pay

## 2014-10-25 NOTE — Telephone Encounter (Signed)
Called pt as she did not show up for her lab/MD visit today.  Pt says she did not have any transportation today.  She has spoken w/ our Education officer, museum and is trying to get transportation arranged but none was arranged for today.  She does have a ride to our clinic on Wed 1/27 and can be here at 11 am.   Informed pt we will need to add on lab appt to this visit and possibly MD visit.  Will check w/ [name]. Instructed her to plan on coming at 11 am since this is the time she has transportation arranged.  Pt verbalized understanding.

## 2014-10-26 ENCOUNTER — Telehealth: Payer: Self-pay | Admitting: Hematology and Oncology

## 2014-10-26 ENCOUNTER — Other Ambulatory Visit: Payer: Self-pay | Admitting: *Deleted

## 2014-10-26 NOTE — Telephone Encounter (Signed)
Please add her to my schedule Wednesday at 1145 am. Thanks

## 2014-10-26 NOTE — Telephone Encounter (Signed)
added appt for 1.27.Marland KitchenMarland KitchenMarland KitchenMarland Kitchenpt is aware per pof

## 2014-10-27 ENCOUNTER — Encounter: Payer: Self-pay | Admitting: Hematology and Oncology

## 2014-10-27 ENCOUNTER — Ambulatory Visit (HOSPITAL_BASED_OUTPATIENT_CLINIC_OR_DEPARTMENT_OTHER): Payer: Medicare Other

## 2014-10-27 ENCOUNTER — Other Ambulatory Visit (HOSPITAL_BASED_OUTPATIENT_CLINIC_OR_DEPARTMENT_OTHER): Payer: Medicare Other

## 2014-10-27 ENCOUNTER — Telehealth: Payer: Self-pay | Admitting: *Deleted

## 2014-10-27 ENCOUNTER — Ambulatory Visit (HOSPITAL_BASED_OUTPATIENT_CLINIC_OR_DEPARTMENT_OTHER): Payer: Medicare Other | Admitting: Hematology and Oncology

## 2014-10-27 ENCOUNTER — Telehealth: Payer: Self-pay | Admitting: Hematology and Oncology

## 2014-10-27 ENCOUNTER — Ambulatory Visit: Payer: Self-pay

## 2014-10-27 ENCOUNTER — Other Ambulatory Visit: Payer: Self-pay | Admitting: Hematology and Oncology

## 2014-10-27 VITALS — BP 112/67 | HR 53 | Temp 98.4°F | Resp 18 | Ht 63.0 in | Wt 135.3 lb

## 2014-10-27 DIAGNOSIS — D630 Anemia in neoplastic disease: Secondary | ICD-10-CM

## 2014-10-27 DIAGNOSIS — C9000 Multiple myeloma not having achieved remission: Secondary | ICD-10-CM

## 2014-10-27 DIAGNOSIS — Z5112 Encounter for antineoplastic immunotherapy: Secondary | ICD-10-CM

## 2014-10-27 DIAGNOSIS — N186 End stage renal disease: Secondary | ICD-10-CM

## 2014-10-27 DIAGNOSIS — D702 Other drug-induced agranulocytosis: Secondary | ICD-10-CM

## 2014-10-27 LAB — COMPREHENSIVE METABOLIC PANEL (CC13)
ALT: <6 U/L (ref 0–55)
AST: 11 U/L (ref 5–34)
Albumin: 2.8 g/dL — ABNORMAL LOW (ref 3.5–5.0)
Alkaline Phosphatase: 42 U/L (ref 40–150)
Anion Gap: 13 mEq/L — ABNORMAL HIGH (ref 3–11)
BUN: 29.1 mg/dL — ABNORMAL HIGH (ref 7.0–26.0)
CO2: 27 mEq/L (ref 22–29)
Calcium: 8.2 mg/dL — ABNORMAL LOW (ref 8.4–10.4)
Chloride: 98 mEq/L (ref 98–109)
Creatinine: 7.6 mg/dL (ref 0.6–1.1)
EGFR: 5 mL/min/{1.73_m2} — AB (ref >90)
Glucose: 88 mg/dl (ref 70–140)
Potassium: 4 mEq/L (ref 3.5–5.1)
Sodium: 138 mEq/L (ref 136–145)
Total Bilirubin: 0.52 mg/dL (ref 0.20–1.20)
Total Protein: 6.8 g/dL (ref 6.4–8.3)

## 2014-10-27 LAB — CBC WITH DIFFERENTIAL/PLATELET
BASO%: 1 % (ref 0.0–2.0)
Basophils Absolute: 0 10*3/uL (ref 0.0–0.1)
EOS%: 1.3 % (ref 0.0–7.0)
Eosinophils Absolute: 0 10*3/uL (ref 0.0–0.5)
HCT: 28 % — ABNORMAL LOW (ref 34.8–46.6)
HGB: 8.7 g/dL — ABNORMAL LOW (ref 11.6–15.9)
LYMPH#: 0.7 10*3/uL — AB (ref 0.9–3.3)
LYMPH%: 26.5 % (ref 14.0–49.7)
MCH: 31.3 pg (ref 25.1–34.0)
MCHC: 30.9 g/dL — AB (ref 31.5–36.0)
MCV: 101.2 fL — ABNORMAL HIGH (ref 79.5–101.0)
MONO#: 0.2 10*3/uL (ref 0.1–0.9)
MONO%: 8.7 % (ref 0.0–14.0)
NEUT#: 1.7 10*3/uL (ref 1.5–6.5)
NEUT%: 62.5 % (ref 38.4–76.8)
Platelets: 187 10*3/uL (ref 145–400)
RBC: 2.77 10*6/uL — ABNORMAL LOW (ref 3.70–5.45)
RDW: 16.5 % — AB (ref 11.2–14.5)
WBC: 2.7 10*3/uL — AB (ref 3.9–10.3)

## 2014-10-27 MED ORDER — ONDANSETRON 8 MG/NS 50 ML IVPB
INTRAVENOUS | Status: AC
  Filled 2014-10-27: qty 8

## 2014-10-27 MED ORDER — SODIUM CHLORIDE 0.9 % IV SOLN
Freq: Once | INTRAVENOUS | Status: AC
  Administered 2014-10-27: 13:00:00 via INTRAVENOUS

## 2014-10-27 MED ORDER — SODIUM CHLORIDE 0.9 % IV SOLN: Freq: Once | INTRAVENOUS | Status: DC

## 2014-10-27 MED ORDER — DEXTROSE 5 % IV SOLN
15.0000 mg/m2 | Freq: Once | INTRAVENOUS | Status: AC
  Administered 2014-10-27: 26 mg via INTRAVENOUS
  Filled 2014-10-27: qty 13

## 2014-10-27 MED ORDER — ONDANSETRON 8 MG/50ML IVPB (CHCC)
8.0000 mg | Freq: Once | INTRAVENOUS | Status: AC
  Administered 2014-10-27: 8 mg via INTRAVENOUS

## 2014-10-27 MED ORDER — DEXAMETHASONE SODIUM PHOSPHATE 10 MG/ML IJ SOLN
INTRAMUSCULAR | Status: AC
  Filled 2014-10-27: qty 1

## 2014-10-27 MED ORDER — DEXAMETHASONE SODIUM PHOSPHATE 10 MG/ML IJ SOLN
10.0000 mg | Freq: Once | INTRAMUSCULAR | Status: AC
  Administered 2014-10-27: 10 mg via INTRAVENOUS

## 2014-10-27 NOTE — Telephone Encounter (Signed)
Per staff message and POF I have scheduled appts. Advised scheduler of appts. JMW  

## 2014-10-27 NOTE — Assessment & Plan Note (Signed)
She is not symptomatic and does not require transfusion or erythropoietin stimulating agent right now.

## 2014-10-27 NOTE — Assessment & Plan Note (Addendum)
She has extensive treatment protocol modification to accommodate for the need for hemodialysis and side effects. I will try to accommodate to her needs and we shall continue modified dose chemotherapy with Kyprolis, Revlimid and dexamethasone along with G-CSF support Her most recent IgG level and M SPike are improving. Will continue the same except tapering dexamethasone to 8 mg weekly.

## 2014-10-27 NOTE — Patient Instructions (Signed)
[facility] Discharge Instructions for Patients Receiving Chemotherapy  Today you received the following chemotherapy agents Kyprolis  To help prevent nausea and vomiting after your treatment, we encourage you to take your nausea medication as directed/prescribed   If you develop nausea and vomiting that is not controlled by your nausea medication, call the clinic.   BELOW ARE SYMPTOMS THAT SHOULD BE REPORTED IMMEDIATELY:  *FEVER GREATER THAN 100.5 F  *CHILLS WITH OR WITHOUT FEVER  NAUSEA AND VOMITING THAT IS NOT CONTROLLED WITH YOUR NAUSEA MEDICATION  *UNUSUAL SHORTNESS OF BREATH  *UNUSUAL BRUISING OR BLEEDING  TENDERNESS IN MOUTH AND THROAT WITH OR WITHOUT PRESENCE OF ULCERS  *URINARY PROBLEMS  *BOWEL PROBLEMS  UNUSUAL RASH Items with * indicate a potential emergency and should be followed up as soon as possible.  Feel free to call the clinic you have any questions or concerns. The clinic phone number is [phone].

## 2014-10-27 NOTE — Assessment & Plan Note (Addendum)
This is likely due to recent treatment. The patient denies recent history of fevers, cough, chills, diarrhea or dysuria. She is asymptomatic from the leukopenia. I will observe for now.  I will continue the chemotherapy at current dose without dosage adjustment.  If the leukopenia gets progressive worse in the future, I might have to delay her treatment or adjust the chemotherapy dose. She will get GCSF support on Fridays

## 2014-10-27 NOTE — Progress Notes (Signed)
[place] OFFICE PROGRESS NOTE  Patient Care Team: No Pcp Per Patient as PCP - General (General Practice)  SUMMARY OF ONCOLOGIC HISTORY:   Multiple myeloma   03/30/2008 Initial Diagnosis Multiple myeloma   03/30/2008 - 05/01/2012 Chemotherapy Revlimid/Dex then Velcade (started on 07/13/2011 ending on 09/14/2011) without response.  Managed by Dr. Chauncey Fischer of [place], [place] [address]   07/24/2010 -  Chemotherapy Received zometa  intermittently over one year.  Creatinine was 1.0 on 07/24/2010. 1.7 on 08/10/2011.    05/01/2012 - 09/01/2012 Chemotherapy Starte Pomalidomide s/p 4 cycles. Referred to [facility] for consideration of ASCT.     10/08/2012 Bone Marrow Biopsy Normocellular marrow with residual/recurrently plasma cell myeloma, 60-70 % of overall marrow celluarity.  Flow: c/w plasma cell neoplasm, plasma cell infiltrate comprises 60-70 % of overall marrow cellularity; FISH: + for 1 q21/CKS1B gain; RB1 (13q14)   10/08/2012 Bone Marrow Biopsy Continued... Cytogenetics showed normal female karyotype.    10/29/2012 Tumor Marker High IgG (6019), high lamda free light chain (409.32) with low IgA, IgM and kappa free light chain.    04/21/2013 Imaging Skeletal survey.  No suspcious lytic or blastic lesions visualized.  Severe multilevel degenerative disc disease in the cervical spine.    04/29/2013 Imaging PeT. No discrete suspicious focus of FDG uptake identified.    05/20/2013 - 06/09/2013 Chemotherapy Started carfilxomib 39 mg on 08/25, 08/26, day 8 (06/02/13), day 9 on 06/03/2013 and on 06/09/2013.     12/03/2013 Treatment Plan Change Last office visit with Dr. Glenna Durand.  She decline further chemotherapy.   [name]    03/11/2014 - 03/17/2014 Hospital Admission Admitted for low hemoglobin. Dr. Burney Gauze consulted.  SPEP,  UPEP and Bone marrow biopsy obtained.    IgG 11,300. M-spike 7.52, kappa lamda ratio 0 with  lambda free light chains totaling 1260.    03/12/2014 Imaging Skeletal survey. 1. Small lytic lesions within the skull and right scapula. 2. Degenerative changes in the spine and knees, right greater than left. 3. Cardiomegaly without pulmonary edema.   03/17/2014 Bone Marrow Biopsy Hypercellular bone marrow tih extensive involvment by plasma cell neoplasm (Plasma cells 85%). FISH: Presence of 2 copies of the IGH/FGFR3, ATM, CCND1/IGH, and p53 probes.  Loss of 13q34 probes in 40% of cells. gain of chromose 12 and either loss of chorm   03/25/2014 -  Chemotherapy Planning Kyprolis 15 mg/m2 plus dexamethasone weekly plus revlimid (84m) daily on 06/29.   06/14/2014 -  Chemotherapy Patient's last dose of Kyprolis was on 05/26/14. Since then chemo held due to neutropenia despite GCSF support. Revlimid also on hold as of 06/14/14   06/28/2014 -  Chemotherapy New cycle of Kyprolis starts today.   07/07/2014 Tumor Marker IgG 3960    07/23/2014 Tumor Marker IgG 3620    INTERVAL HISTORY: Please see below for problem oriented charting. She is seen prior to cycle 8 of treatment. She has recovered fully from recent pneumonia. Denies chest pain no shortness of breath. No fevers or chills. She complained of abdominal bloating.  REVIEW OF SYSTEMS:   Constitutional: Denies fevers, chills or abnormal weight loss Eyes: Denies blurriness of vision Ears, nose, mouth, throat, and face: Denies mucositis or sore throat Respiratory: Denies cough, dyspnea or wheezes Cardiovascular: Denies palpitation, chest discomfort or lower extremity swelling Skin: Denies abnormal skin rashes Lymphatics: Denies new lymphadenopathy or easy bruising Neurological:Denies numbness, tingling or new weaknesses Behavioral/Psych:  Mood is stable, no new changes  All other systems were reviewed with the patient and are negative.  I have reviewed the past medical history, past surgical history, social history and family history with the patient and  they are unchanged from previous note.  ALLERGIES:  has No Known Allergies.  MEDICATIONS:  Current Outpatient Prescriptions  Medication Sig Dispense Refill  . dexamethasone (DECADRON) 4 MG tablet Take 3 tablets (12 mg) on days 1,8,15, and 22 of chemotherapy. 24 tablet 2  . furosemide (LASIX) 20 MG tablet Take 1 tablet (20 mg total) by mouth daily. PRN for leg edema 60 tablet 2  . hydrALAZINE (APRESOLINE) 100 MG tablet Take 100 mg by mouth every 8 (eight) hours.    Marland Kitchen HYDROcodone-homatropine (HYCODAN) 5-1.5 MG/5ML syrup Take 5 mLs by mouth every 6 (six) hours as needed for cough. 120 mL 0  . isosorbide mononitrate (IMDUR) 60 MG 24 hr tablet Take 60 mg by mouth daily.    Marland Kitchen lenalidomide (REVLIMID) 2.5 MG capsule Take 1 capsule (2.5 mg total) by mouth daily. 28 capsule 0  . vitamin C (ASCORBIC ACID) 500 MG tablet Take 500 mg by mouth daily.    Marland Kitchen levofloxacin (LEVAQUIN) 500 MG tablet Take 1 tablet (500 mg total) by mouth every other day. (Patient not taking: Reported on 10/27/2014) 4 tablet 0  . LORazepam (ATIVAN) 0.5 MG tablet Take 1 tablet (0.5 mg total) by mouth every 6 (six) hours as needed (Nausea or vomiting). (Patient not taking: Reported on 10/16/2014) 30 tablet 0  . Nutritional Supplements (FEEDING SUPPLEMENT, NEPRO CARB STEADY,) LIQD Take 237 mLs by mouth 2 (two) times daily between meals. (Patient not taking: Reported on 10/27/2014) 237 mL 60  . ondansetron (ZOFRAN) 8 MG tablet Take 1 tablet (8 mg total) by mouth 2 (two) times daily. Start the day after chemo for 2 days. Then as needed for nausea or vomiting. (Patient not taking: Reported on 10/27/2014) 30 tablet 1  . prochlorperazine (COMPAZINE) 10 MG tablet Take 1 tablet (10 mg total) by mouth every 6 (six) hours as needed (Nausea or vomiting). (Patient not taking: Reported on 10/16/2014) 30 tablet 1   No current facility-administered medications for this visit.   Facility-Administered Medications Ordered in Other Visits  Medication Dose  Route Frequency Provider Last Rate Last Dose  . 0.9 %  sodium chloride infusion   Intravenous Once Aasim Marla Roe, MD        PHYSICAL EXAMINATION: ECOG PERFORMANCE STATUS: 1 - Symptomatic but completely ambulatory  Filed Vitals:   10/27/14 1145  BP: 112/67  Pulse: 53  Temp: 98.4 F (36.9 C)  Resp: 18   Filed Weights   10/27/14 1145  Weight: 135 lb 4.8 oz (61.372 kg)    GENERAL:alert, no distress and comfortable SKIN: skin color, texture, turgor are normal, no rashes or significant lesions EYES: normal, Conjunctiva are pink and non-injected, sclera clear OROPHARYNX:no exudate, no erythema and lips, buccal mucosa, and tongue normal  NECK: supple, thyroid normal size, non-tender, without nodularity LYMPH:  no palpable lymphadenopathy in the cervical, axillary or inguinal LUNGS: clear to auscultation and percussion with normal breathing effort HEART: regular rate & rhythm and no murmurs and no lower extremity edema ABDOMEN:abdomen soft, non-tender and normal bowel sounds Musculoskeletal:no cyanosis of digits and no clubbing  NEURO: alert & oriented x 3 with fluent speech, no focal motor/sensory deficits  LABORATORY DATA:  I have reviewed the data as listed    Component Value Date/Time   NA  138 10/27/2014 1116   NA 131* 10/19/2014 1245   K 4.0 10/27/2014 1116   K 3.9 10/19/2014 1245   CL 96 10/19/2014 1245   CO2 27 10/27/2014 1116   CO2 24 10/19/2014 1245   GLUCOSE 88 10/27/2014 1116   GLUCOSE 89 10/19/2014 1245   BUN 29.1* 10/27/2014 1116   BUN 46* 10/19/2014 1245   CREATININE 7.6* 10/27/2014 1116   CREATININE 11.03* 10/19/2014 1245   CALCIUM 8.2* 10/27/2014 1116   CALCIUM 8.3* 10/19/2014 1245   PROT 6.8 10/27/2014 1116   PROT 10.3* 05/10/2014 1439   ALBUMIN 2.8* 10/27/2014 1116   ALBUMIN 2.5* 10/19/2014 0441   AST 11 10/27/2014 1116   AST 17 05/10/2014 1439   ALT <6 10/27/2014 1116   ALT 7 05/10/2014 1439   ALKPHOS 42 10/27/2014 1116   ALKPHOS 50  05/10/2014 1439   BILITOT 0.52 10/27/2014 1116   BILITOT 0.4 05/10/2014 1439   GFRNONAA 3* 10/19/2014 1245   GFRAA 3* 10/19/2014 1245    No results found for: SPEP, UPEP  Lab Results  Component Value Date   WBC 2.7* 10/27/2014   NEUTROABS 1.7 10/27/2014   HGB 8.7* 10/27/2014   HCT 28.0* 10/27/2014   MCV 101.2* 10/27/2014   PLT 187 10/27/2014      Chemistry      Component Value Date/Time   NA 138 10/27/2014 1116   NA 131* 10/19/2014 1245   K 4.0 10/27/2014 1116   K 3.9 10/19/2014 1245   CL 96 10/19/2014 1245   CO2 27 10/27/2014 1116   CO2 24 10/19/2014 1245   BUN 29.1* 10/27/2014 1116   BUN 46* 10/19/2014 1245   CREATININE 7.6* 10/27/2014 1116   CREATININE 11.03* 10/19/2014 1245      Component Value Date/Time   CALCIUM 8.2* 10/27/2014 1116   CALCIUM 8.3* 10/19/2014 1245   ALKPHOS 42 10/27/2014 1116   ALKPHOS 50 05/10/2014 1439   AST 11 10/27/2014 1116   AST 17 05/10/2014 1439   ALT <6 10/27/2014 1116   ALT 7 05/10/2014 1439   BILITOT 0.52 10/27/2014 1116   BILITOT 0.4 05/10/2014 1439      ASSESSMENT & PLAN:  Multiple myeloma She has extensive treatment protocol modification to accommodate for the need for hemodialysis and side effects. I will try to accommodate to her needs and we shall continue modified dose chemotherapy with Kyprolis, Revlimid and dexamethasone along with G-CSF support Her most recent IgG level and M SPike are improving. Will continue the same except tapering dexamethasone to 8 mg weekly.   Anemia in neoplastic disease She is not symptomatic and does not require transfusion or erythropoietin stimulating agent right now.   Drug-induced neutropenia This is likely due to recent treatment. The patient denies recent history of fevers, cough, chills, diarrhea or dysuria. She is asymptomatic from the leukopenia. I will observe for now.  I will continue the chemotherapy at current dose without dosage adjustment.  If the leukopenia gets progressive  worse in the future, I might have to delay her treatment or adjust the chemotherapy dose. She will get GCSF support on Fridays     ESRD (end stage renal disease) She will continue hemodialysis 3 times a week through the fistula on Tuesday, Thursday and Saturday.    No orders of the defined types were placed in this encounter.   All questions were answered. The patient knows to call the clinic with any problems, questions or concerns. No barriers to learning was detected. I  spent 25 minutes counseling the patient face to face. The total time spent in the appointment was 30 minutes and more than 50% was on counseling and review of test results     [facility], [place], MD 10/27/2014 12:32 PM

## 2014-10-27 NOTE — Telephone Encounter (Signed)
gv and printed appt sched and avs fo rpt for Feb appts....sed added tx....emailed Polo Riley to advised on updated sched for pt for transportation

## 2014-10-27 NOTE — Assessment & Plan Note (Signed)
She will continue hemodialysis 3 times a week through the fistula on Tuesday, Thursday and Saturday.

## 2014-10-29 ENCOUNTER — Ambulatory Visit (HOSPITAL_BASED_OUTPATIENT_CLINIC_OR_DEPARTMENT_OTHER): Payer: Medicare Other

## 2014-10-29 DIAGNOSIS — Z5189 Encounter for other specified aftercare: Secondary | ICD-10-CM

## 2014-10-29 DIAGNOSIS — D61818 Other pancytopenia: Secondary | ICD-10-CM

## 2014-10-29 DIAGNOSIS — C9000 Multiple myeloma not having achieved remission: Secondary | ICD-10-CM

## 2014-10-29 MED ORDER — TBO-FILGRASTIM 480 MCG/0.8ML ~~LOC~~ SOSY
480.0000 ug | PREFILLED_SYRINGE | Freq: Once | SUBCUTANEOUS | Status: AC
  Administered 2014-10-29: 480 ug via SUBCUTANEOUS
  Filled 2014-10-29: qty 0.8

## 2014-11-01 ENCOUNTER — Ambulatory Visit (HOSPITAL_BASED_OUTPATIENT_CLINIC_OR_DEPARTMENT_OTHER): Payer: Medicare Other

## 2014-11-01 ENCOUNTER — Other Ambulatory Visit (HOSPITAL_BASED_OUTPATIENT_CLINIC_OR_DEPARTMENT_OTHER): Payer: Medicare Other

## 2014-11-01 DIAGNOSIS — N186 End stage renal disease: Secondary | ICD-10-CM

## 2014-11-01 DIAGNOSIS — C9000 Multiple myeloma not having achieved remission: Secondary | ICD-10-CM

## 2014-11-01 DIAGNOSIS — Z5112 Encounter for antineoplastic immunotherapy: Secondary | ICD-10-CM

## 2014-11-01 LAB — CBC WITH DIFFERENTIAL/PLATELET
BASO%: 0.3 % (ref 0.0–2.0)
Basophils Absolute: 0 10*3/uL (ref 0.0–0.1)
EOS ABS: 0.1 10*3/uL (ref 0.0–0.5)
EOS%: 1.2 % (ref 0.0–7.0)
HEMATOCRIT: 29.6 % — AB (ref 34.8–46.6)
HGB: 9.2 g/dL — ABNORMAL LOW (ref 11.6–15.9)
LYMPH%: 14.3 % (ref 14.0–49.7)
MCH: 32.1 pg (ref 25.1–34.0)
MCHC: 31.2 g/dL — ABNORMAL LOW (ref 31.5–36.0)
MCV: 103.1 fL — ABNORMAL HIGH (ref 79.5–101.0)
MONO#: 0.2 10*3/uL (ref 0.1–0.9)
MONO%: 3.1 % (ref 0.0–14.0)
NEUT#: 5.4 10*3/uL (ref 1.5–6.5)
NEUT%: 81.1 % — ABNORMAL HIGH (ref 38.4–76.8)
Platelets: 156 10*3/uL (ref 145–400)
RBC: 2.88 10*6/uL — ABNORMAL LOW (ref 3.70–5.45)
RDW: 18.4 % — ABNORMAL HIGH (ref 11.2–14.5)
WBC: 6.7 10*3/uL (ref 3.9–10.3)
lymph#: 1 10*3/uL (ref 0.9–3.3)

## 2014-11-01 LAB — COMPREHENSIVE METABOLIC PANEL (CC13)
ALBUMIN: 2.9 g/dL — AB (ref 3.5–5.0)
ALK PHOS: 45 U/L (ref 40–150)
ALT: 7 U/L (ref 0–55)
AST: 10 U/L (ref 5–34)
Anion Gap: 12 mEq/L — ABNORMAL HIGH (ref 3–11)
BUN: 33.2 mg/dL — AB (ref 7.0–26.0)
CO2: 27 mEq/L (ref 22–29)
CREATININE: 8.4 mg/dL — AB (ref 0.6–1.1)
Calcium: 9 mg/dL (ref 8.4–10.4)
Chloride: 99 mEq/L (ref 98–109)
EGFR: 5 mL/min/{1.73_m2} — ABNORMAL LOW (ref >90)
GLUCOSE: 87 mg/dL (ref 70–140)
Potassium: 4.5 mEq/L (ref 3.5–5.1)
Sodium: 138 mEq/L (ref 136–145)
Total Bilirubin: 0.42 mg/dL (ref 0.20–1.20)
Total Protein: 7.1 g/dL (ref 6.4–8.3)

## 2014-11-01 MED ORDER — ONDANSETRON 8 MG/NS 50 ML IVPB
INTRAVENOUS | Status: AC
  Filled 2014-11-01: qty 8

## 2014-11-01 MED ORDER — DEXAMETHASONE SODIUM PHOSPHATE 10 MG/ML IJ SOLN
INTRAMUSCULAR | Status: AC
  Filled 2014-11-01: qty 1

## 2014-11-01 MED ORDER — ONDANSETRON 8 MG/50ML IVPB (CHCC)
8.0000 mg | Freq: Once | INTRAVENOUS | Status: AC
  Administered 2014-11-01: 8 mg via INTRAVENOUS

## 2014-11-01 MED ORDER — DEXAMETHASONE SODIUM PHOSPHATE 10 MG/ML IJ SOLN
10.0000 mg | Freq: Once | INTRAMUSCULAR | Status: AC
  Administered 2014-11-01: 10 mg via INTRAVENOUS

## 2014-11-01 MED ORDER — DEXTROSE 5 % IV SOLN
15.0000 mg/m2 | Freq: Once | INTRAVENOUS | Status: AC
  Administered 2014-11-01: 26 mg via INTRAVENOUS
  Filled 2014-11-01: qty 13

## 2014-11-01 MED ORDER — SODIUM CHLORIDE 0.9 % IV SOLN
Freq: Once | INTRAVENOUS | Status: AC
  Administered 2014-11-01: 13:00:00 via INTRAVENOUS

## 2014-11-01 NOTE — Patient Instructions (Signed)
[facility] Discharge Instructions for Patients Receiving Chemotherapy  Today you received the following chemotherapy agents Kyprolis.  To help prevent nausea and vomiting after your treatment, we encourage you to take your nausea medication Ativan, zofran, compazine as ordered by provider.   If you develop nausea and vomiting that is not controlled by your nausea medication, call the clinic.   BELOW ARE SYMPTOMS THAT SHOULD BE REPORTED IMMEDIATELY:  *FEVER GREATER THAN 100.5 F  *CHILLS WITH OR WITHOUT FEVER  NAUSEA AND VOMITING THAT IS NOT CONTROLLED WITH YOUR NAUSEA MEDICATION  *UNUSUAL SHORTNESS OF BREATH  *UNUSUAL BRUISING OR BLEEDING  TENDERNESS IN MOUTH AND THROAT WITH OR WITHOUT PRESENCE OF ULCERS  *URINARY PROBLEMS  *BOWEL PROBLEMS  UNUSUAL RASH Items with * indicate a potential emergency and should be followed up as soon as possible.  Feel free to call the clinic you have any questions or concerns. The clinic phone number is [phone].

## 2014-11-01 NOTE — Progress Notes (Signed)
1425 discharged, ambulatory in no distress.

## 2014-11-02 ENCOUNTER — Telehealth: Payer: Self-pay | Admitting: Internal Medicine

## 2014-11-02 NOTE — Telephone Encounter (Signed)
Call to patient to confirm appointment for 11/03/14 9:15. Patient confirmed.

## 2014-11-03 ENCOUNTER — Encounter: Payer: Self-pay | Admitting: *Deleted

## 2014-11-03 ENCOUNTER — Ambulatory Visit (HOSPITAL_BASED_OUTPATIENT_CLINIC_OR_DEPARTMENT_OTHER): Payer: Medicare Other

## 2014-11-03 ENCOUNTER — Encounter: Payer: Self-pay | Admitting: Internal Medicine

## 2014-11-03 ENCOUNTER — Ambulatory Visit (INDEPENDENT_AMBULATORY_CARE_PROVIDER_SITE_OTHER): Payer: Medicare Other | Admitting: Internal Medicine

## 2014-11-03 VITALS — BP 107/69 | HR 66 | Temp 99.6°F | Resp 20 | Ht 66.0 in | Wt 136.9 lb

## 2014-11-03 DIAGNOSIS — Z0000 Encounter for general adult medical examination without abnormal findings: Secondary | ICD-10-CM

## 2014-11-03 DIAGNOSIS — Z5112 Encounter for antineoplastic immunotherapy: Secondary | ICD-10-CM

## 2014-11-03 DIAGNOSIS — I120 Hypertensive chronic kidney disease with stage 5 chronic kidney disease or end stage renal disease: Secondary | ICD-10-CM

## 2014-11-03 DIAGNOSIS — Y95 Nosocomial condition: Secondary | ICD-10-CM

## 2014-11-03 DIAGNOSIS — Z992 Dependence on renal dialysis: Secondary | ICD-10-CM

## 2014-11-03 DIAGNOSIS — C9000 Multiple myeloma not having achieved remission: Secondary | ICD-10-CM

## 2014-11-03 DIAGNOSIS — D702 Other drug-induced agranulocytosis: Secondary | ICD-10-CM

## 2014-11-03 DIAGNOSIS — J189 Pneumonia, unspecified organism: Secondary | ICD-10-CM

## 2014-11-03 DIAGNOSIS — N186 End stage renal disease: Secondary | ICD-10-CM

## 2014-11-03 MED ORDER — DEXAMETHASONE SODIUM PHOSPHATE 10 MG/ML IJ SOLN
INTRAMUSCULAR | Status: AC
  Filled 2014-11-03: qty 1

## 2014-11-03 MED ORDER — CARFILZOMIB CHEMO INJECTION 60 MG
15.0000 mg/m2 | Freq: Once | INTRAVENOUS | Status: AC
  Administered 2014-11-03: 26 mg via INTRAVENOUS
  Filled 2014-11-03: qty 13

## 2014-11-03 MED ORDER — SODIUM CHLORIDE 0.9 % IV SOLN: Freq: Once | INTRAVENOUS | Status: DC

## 2014-11-03 MED ORDER — ONDANSETRON 8 MG/50ML IVPB (CHCC)
8.0000 mg | Freq: Once | INTRAVENOUS | Status: AC
  Administered 2014-11-03: 8 mg via INTRAVENOUS

## 2014-11-03 MED ORDER — ONDANSETRON 8 MG/NS 50 ML IVPB
INTRAVENOUS | Status: AC
  Filled 2014-11-03: qty 8

## 2014-11-03 MED ORDER — DEXAMETHASONE SODIUM PHOSPHATE 10 MG/ML IJ SOLN
10.0000 mg | Freq: Once | INTRAMUSCULAR | Status: AC
  Administered 2014-11-03: 10 mg via INTRAVENOUS

## 2014-11-03 MED ORDER — SODIUM CHLORIDE 0.9 % IV SOLN
Freq: Once | INTRAVENOUS | Status: AC
  Administered 2014-11-03: 12:00:00 via INTRAVENOUS

## 2014-11-03 NOTE — Progress Notes (Signed)
[facility] Clinical Social Work  Clinical Social Work received call from [employer], scheduling, patient has new appointments needing transportation requests.  CSW contacted [facility] and requested transportation for the following appointments.  Patient is aware [facility] will follow up with her to determine if they have found volunteer drivers.   S 11/08/2014 Mon 12:45 P 15 LAB MO [1477] [facility] LAB 6 [id number] [facility] [id number]       11/08/2014 Mon  1:30 P 120 INFUSION 2 HR [1452] [facility] PROCEDURE 2  [facility] [id number] kypro      S 11/10/2014 Wed  1:30 P 120 INFUSION 2 HR [1452] [facility] I25 DNS  [facility] [id number]      S 11/12/2014 Fri 11:15 A 15 INJECTION [1081] [facility] INJ NURSE [id number] [facility] [id number]      S 11/22/2014 Mon 11:00 A 15 LAB MO [1477] [facility] LAB 6 [id number] [facility] [id number]      S 11/22/2014 Mon 11:30 A 30 EST PT 30 [965] [place], NI [id number] [facility] [id number] d.t. per pof     S 11/22/2014 Mon 12:15 P 120 INFUSION 2 HR [1452] [facility] F20  [facility] [id number]      S 11/24/2014 Wed 11:45 A 120 INFUSION 2 HR [1452] [facility] G23  [facility] [id number]       11/26/2014 Fri 10:00 A 15 DG X-RAY [id number] [facility] 4 (CHEST) [id number] [facility] [id number]      S 11/26/2014 Fri 11:00 A 15 INJECTION [1081] [facility] INJ NURSE [id number] [facility] [id number]      Polo Riley, MSW, LCSW, OSW-C Clinical Social Worker [place] [phone]

## 2014-11-03 NOTE — Assessment & Plan Note (Signed)
HD at [place], need to request records for PCP to review Next session 11/04/14

## 2014-11-03 NOTE — Progress Notes (Signed)
Case discussed with Dr. Qureshi at the time of the visit.  We reviewed the resident's history and exam and pertinent patient test results.  I agree with the assessment, diagnosis, and plan of care documented in the resident's note. 

## 2014-11-03 NOTE — Assessment & Plan Note (Signed)
Follows closely with [name]. Chemotherapy Mondays and Wednesday

## 2014-11-03 NOTE — Progress Notes (Signed)
Subjective:   Patient ID: [name] female   DOB: November 15, 1940 74 y.o.   MRN: [id number]  HPI: CarrieJolonda [name] is a 74 y.o. very pleasant female with MM on chemotherapy and ESRD on HD T,Th,Sat presenting to opc today for hospital follow up and establishing primary care.  New PCP will be [name].   HCAP--recent hospital admission 10/16/14, treated with vancomycin and cefepime while in the hospital and discharged on Levaquin which she has completed the course. No SOB or chest pain. Occasionally has sneezing and a cough when she is at HD but reports feeling much better. Diarrhea has also resolved.   MM--follows closely at cancer center with [name]. Chemotherapy every Monday and Wednesday with Kyprolis, Revlimid, and dexamethasone. GCSF on Friday's.  Improving IgG levels per patient and tapering dexamethasone. She is very pleased with there treatment and progress. Used to be followed in [place] prior to this.   ESRD on HD--T,Th,Sat, [place] [facility], will ask for records for pcp to review. Reports needing to get fistula examined on Friday per renal but does not know her regular physician for HD. Reports dry weight to be 60kg  HTN--on hydralazine and IMDUR. Denies taking amlodipine. BP improved with standing up to 107/69. Reports taking hydralazine and IMDUR since [place], PCP over there was [name] in [place]. Will try to request records for PCP to review as well. Unclear who is currently prescribing these medications.   Past Medical History  Diagnosis Date  . Renal disorder   . ESRD (end stage renal disease)   . Hypertension   . Multiple myeloma 2009  . Thyroid disease   . Hyperparathyroidism   . Thrombocytopenia   . Anemia   . Shortness of breath   . Pneumonia   . GERD (gastroesophageal reflux disease)   . Arthritis     rt knee is stiff   Current Outpatient Prescriptions  Medication Sig Dispense Refill  . dexamethasone (DECADRON) 4 MG tablet Take 3 tablets (12 mg) on days 1,8,15, and  22 of chemotherapy. (Patient taking differently: Take 3 tablets (12 mg) on days 1,8,15, and 22 of chemotherapy.  Taking 2 tablets) 24 tablet 2  . hydrALAZINE (APRESOLINE) 100 MG tablet Take 100 mg by mouth every 8 (eight) hours.    . isosorbide mononitrate (IMDUR) 60 MG 24 hr tablet Take 60 mg by mouth daily.    Marland Kitchen lenalidomide (REVLIMID) 2.5 MG capsule Take 1 capsule (2.5 mg total) by mouth daily. 28 capsule 0  . vitamin C (ASCORBIC ACID) 500 MG tablet Take 500 mg by mouth daily.    . furosemide (LASIX) 20 MG tablet Take 1 tablet (20 mg total) by mouth daily. PRN for leg edema (Patient not taking: Reported on 11/03/2014) 60 tablet 2  . HYDROcodone-homatropine (HYCODAN) 5-1.5 MG/5ML syrup Take 5 mLs by mouth every 6 (six) hours as needed for cough. (Patient not taking: Reported on 11/03/2014) 120 mL 0  . LORazepam (ATIVAN) 0.5 MG tablet Take 1 tablet (0.5 mg total) by mouth every 6 (six) hours as needed (Nausea or vomiting). (Patient not taking: Reported on 10/16/2014) 30 tablet 0  . Nutritional Supplements (FEEDING SUPPLEMENT, NEPRO CARB STEADY,) LIQD Take 237 mLs by mouth 2 (two) times daily between meals. (Patient not taking: Reported on 10/27/2014) 237 mL 60  . ondansetron (ZOFRAN) 8 MG tablet Take 1 tablet (8 mg total) by mouth 2 (two) times daily. Start the day after chemo for 2 days. Then as needed for nausea or  vomiting. (Patient not taking: Reported on 10/27/2014) 30 tablet 1  . prochlorperazine (COMPAZINE) 10 MG tablet Take 1 tablet (10 mg total) by mouth every 6 (six) hours as needed (Nausea or vomiting). (Patient not taking: Reported on 10/16/2014) 30 tablet 1   No current facility-administered medications for this visit.   Facility-Administered Medications Ordered in Other Visits  Medication Dose Route Frequency Provider Last Rate Last Dose  . 0.9 %  sodium chloride infusion   Intravenous Once Aasim Marla Roe, MD       Family History  Problem Relation Age of Onset  . Hypertension  Mother   . Hypertension Father    History   Social History  . Marital Status: Married    Spouse Name: N/A    Number of Children: 3  . Years of Education: N/A   Occupational History  . Retired      Pharmacist, hospital asst.   Social History Main Topics  . Smoking status: Never Smoker   . Smokeless tobacco: Never Used  . Alcohol Use: No  . Drug Use: No  . Sexual Activity: No   Other Topics Concern  . None   Social History Narrative   Patient recently moved to [place] from [place] in May 2015.   [name] was born in [place], [place].   Patient has brother in [place] area.   Review of Systems:  Constitutional:  Denies fever, chills  HEENT:  Occasional sneezing and cough during HD  Respiratory:  Denies SOB  Cardiovascular:  Denies chest pain  Gastrointestinal:  Denies nausea, vomiting, abdominal pain  Genitourinary:  Still urinating at times  Musculoskeletal:  Lower extremity edema.   Skin:  Denies pallor, rash and wound.    Objective:  Physical Exam: Filed Vitals:   11/03/14 0942 11/03/14 0947  BP: 99/64 107/69  Pulse: 65 66  Temp: 99.6 F (37.6 C)   TempSrc: Oral   Resp: 20   Height: _0  (1.676 m)   Weight: 136 lb 14.4 oz (62.097 kg)   SpO2: 100%    Vitals reviewed. General: sitting in chair, NAD HEENT: EOMI, wears dentures Cardiac: RRR, holosystolic murmur Pulm: clear to auscultation bilaterally, no wheezes, rales, or rhonchi Abd: soft, nontender, nondistended, BS present Ext: +2 pitting edema b/l, tenderness to palpation of lower extremities Neuro: alert and oriented X3, strength grossly intact in b/l extremities, walks without assitance  Assessment & Plan:  Discussed with Dr. Ellwood Dense F/u cxr 4-6 weeks Request records for pcp to review

## 2014-11-03 NOTE — Patient Instructions (Signed)
[facility] Discharge Instructions for Patients Receiving Chemotherapy  Today you received the following chemotherapy agents:  Kyprolis  To help prevent nausea and vomiting after your treatment, we encourage you to take your nausea medication as ordered per MD.   If you develop nausea and vomiting that is not controlled by your nausea medication, call the clinic.   BELOW ARE SYMPTOMS THAT SHOULD BE REPORTED IMMEDIATELY:  *FEVER GREATER THAN 100.5 F  *CHILLS WITH OR WITHOUT FEVER  NAUSEA AND VOMITING THAT IS NOT CONTROLLED WITH YOUR NAUSEA MEDICATION  *UNUSUAL SHORTNESS OF BREATH  *UNUSUAL BRUISING OR BLEEDING  TENDERNESS IN MOUTH AND THROAT WITH OR WITHOUT PRESENCE OF ULCERS  *URINARY PROBLEMS  *BOWEL PROBLEMS  UNUSUAL RASH Items with * indicate a potential emergency and should be followed up as soon as possible.  Feel free to call the clinic you have any questions or concerns. The clinic phone number is [phone].

## 2014-11-03 NOTE — Assessment & Plan Note (Signed)
Reports having colonoscopy done in [place] prior to leaving last year and it was negative. Need to try to get record for pcp to review.  Wants to check with renal and oncology if she has gotten pneumococcal vaccination before.

## 2014-11-03 NOTE — Assessment & Plan Note (Addendum)
Well controlled. At rest 99/64 but increased to 107/69 with standing. On hydralazine and IMDUR at home since [place], unclear who is prescribing this and if still needed.   BP Readings from Last 3 Encounters:  11/03/14 107/69  11/01/14 112/76  10/29/14 115/70    Lab Results  Component Value Date   NA 138 11/01/2014   K 4.5 11/01/2014   CREATININE 8.4* 11/01/2014    Assessment: Blood pressure control: controlled Progress toward BP goal:  at goal Comments: need to review prior records to see if still needed and who is prescribing it, she says her doctor in [place]: Medications:  continue current medications hydralazine 100mg  q8h and imdur 60mg  daily. She is not taking her lasix x1 month.  Educational resources provided:   Self management tools provided:   Other plans: need records for pcp to review. Consider echo for baseline

## 2014-11-03 NOTE — Patient Instructions (Signed)
General Instructions:  Please bring your medicines with you each time you come to clinic.  Medicines may include prescription medications, over-the-counter medications, herbal remedies, eye drops, vitamins, or other pills.  Thank you for establishing your care with [place] today  We will have you return for follow up chest xray in approximately 4-6 weeks  Please have your next follow up visit scheduled with your PCP [name]  Please let us know if there is anything else we can do for you  We will try to have the dentist referral done  Progress Toward Treatment Goals:  Treatment Goal 11/03/2014  Blood pressure at goal    Self Care Goals & Plans:  Self Care Goal 11/03/2014  Manage my medications take my medicines as prescribed; bring my medications to every visit; refill my medications on time  Monitor my health keep track of my blood pressure  Eat healthy foods drink diet soda or water instead of juice or soda; eat foods that are low in salt; eat baked foods instead of fried foods  Be physically active find an activity I enjoy; join a walking program    No flowsheet data found.   Care Management & Community Referrals:  No flowsheet data found.

## 2014-11-03 NOTE — Assessment & Plan Note (Addendum)
Improved clinically. No pain or SOB. Completed course of levaquin.   Follow up cxr 4-6 weeks, likely end of Feb or March, unless needs CT from oncology follow up Consider echo for baseline, ?enalargement of cardiac silhouette in imaging

## 2014-11-03 NOTE — Assessment & Plan Note (Addendum)
Now resolved. Last WBC 6.7 11/01/14. On GCSF

## 2014-11-05 ENCOUNTER — Ambulatory Visit (HOSPITAL_BASED_OUTPATIENT_CLINIC_OR_DEPARTMENT_OTHER): Payer: Medicare Other

## 2014-11-05 ENCOUNTER — Telehealth: Payer: Self-pay | Admitting: *Deleted

## 2014-11-05 ENCOUNTER — Telehealth: Payer: Self-pay

## 2014-11-05 DIAGNOSIS — D709 Neutropenia, unspecified: Secondary | ICD-10-CM

## 2014-11-05 DIAGNOSIS — C9000 Multiple myeloma not having achieved remission: Secondary | ICD-10-CM

## 2014-11-05 DIAGNOSIS — D61818 Other pancytopenia: Secondary | ICD-10-CM

## 2014-11-05 MED ORDER — TBO-FILGRASTIM 480 MCG/0.8ML ~~LOC~~ SOSY
480.0000 ug | PREFILLED_SYRINGE | Freq: Once | SUBCUTANEOUS | Status: AC
  Administered 2014-11-05: 480 ug via SUBCUTANEOUS
  Filled 2014-11-05: qty 0.8

## 2014-11-05 NOTE — Telephone Encounter (Signed)
revlimid refill request to Dr Alvy Bimler desk

## 2014-11-05 NOTE — Telephone Encounter (Signed)
Revlimid RX faxed to [facility]. Auth # [id number]

## 2014-11-08 ENCOUNTER — Other Ambulatory Visit: Payer: Self-pay

## 2014-11-08 ENCOUNTER — Other Ambulatory Visit (HOSPITAL_BASED_OUTPATIENT_CLINIC_OR_DEPARTMENT_OTHER): Payer: Medicare Other

## 2014-11-08 ENCOUNTER — Ambulatory Visit (HOSPITAL_BASED_OUTPATIENT_CLINIC_OR_DEPARTMENT_OTHER): Payer: Medicare Other

## 2014-11-08 DIAGNOSIS — N186 End stage renal disease: Secondary | ICD-10-CM

## 2014-11-08 DIAGNOSIS — Z5112 Encounter for antineoplastic immunotherapy: Secondary | ICD-10-CM

## 2014-11-08 DIAGNOSIS — C9000 Multiple myeloma not having achieved remission: Secondary | ICD-10-CM

## 2014-11-08 LAB — COMPREHENSIVE METABOLIC PANEL (CC13)
ALT: 8 U/L (ref 0–55)
AST: 12 U/L (ref 5–34)
Albumin: 3 g/dL — ABNORMAL LOW (ref 3.5–5.0)
Alkaline Phosphatase: 41 U/L (ref 40–150)
Anion Gap: 13 mEq/L — ABNORMAL HIGH (ref 3–11)
BUN: 39.5 mg/dL — ABNORMAL HIGH (ref 7.0–26.0)
CO2: 24 mEq/L (ref 22–29)
Calcium: 9.3 mg/dL (ref 8.4–10.4)
Chloride: 101 mEq/L (ref 98–109)
Creatinine: 9.4 mg/dL (ref 0.6–1.1)
EGFR: 4 mL/min/{1.73_m2} — ABNORMAL LOW (ref >90)
Glucose: 72 mg/dl (ref 70–140)
Potassium: 4.7 mEq/L (ref 3.5–5.1)
Sodium: 138 mEq/L (ref 136–145)
Total Bilirubin: 0.43 mg/dL (ref 0.20–1.20)
Total Protein: 7 g/dL (ref 6.4–8.3)

## 2014-11-08 LAB — CBC WITH DIFFERENTIAL/PLATELET
BASO%: 0.7 % (ref 0.0–2.0)
BASOS ABS: 0 10*3/uL (ref 0.0–0.1)
EOS%: 2.2 % (ref 0.0–7.0)
Eosinophils Absolute: 0.1 10*3/uL (ref 0.0–0.5)
HCT: 29.2 % — ABNORMAL LOW (ref 34.8–46.6)
HGB: 9 g/dL — ABNORMAL LOW (ref 11.6–15.9)
LYMPH#: 1.3 10*3/uL (ref 0.9–3.3)
LYMPH%: 20.8 % (ref 14.0–49.7)
MCH: 31.9 pg (ref 25.1–34.0)
MCHC: 30.7 g/dL — AB (ref 31.5–36.0)
MCV: 103.9 fL — ABNORMAL HIGH (ref 79.5–101.0)
MONO#: 0.5 10*3/uL (ref 0.1–0.9)
MONO%: 8.6 % (ref 0.0–14.0)
NEUT%: 67.7 % (ref 38.4–76.8)
NEUTROS ABS: 4.3 10*3/uL (ref 1.5–6.5)
PLATELETS: 119 10*3/uL — AB (ref 145–400)
RBC: 2.81 10*6/uL — AB (ref 3.70–5.45)
RDW: 19.2 % — AB (ref 11.2–14.5)
WBC: 6.3 10*3/uL (ref 3.9–10.3)

## 2014-11-08 MED ORDER — ONDANSETRON 8 MG/NS 50 ML IVPB
INTRAVENOUS | Status: AC
  Filled 2014-11-08: qty 8

## 2014-11-08 MED ORDER — SODIUM CHLORIDE 0.9 % IV SOLN
Freq: Once | INTRAVENOUS | Status: AC
  Administered 2014-11-08: 15:00:00 via INTRAVENOUS

## 2014-11-08 MED ORDER — DEXTROSE 5 % IV SOLN
15.0000 mg/m2 | Freq: Once | INTRAVENOUS | Status: AC
  Administered 2014-11-08: 26 mg via INTRAVENOUS
  Filled 2014-11-08: qty 13

## 2014-11-08 MED ORDER — DEXAMETHASONE SODIUM PHOSPHATE 10 MG/ML IJ SOLN
INTRAMUSCULAR | Status: AC
  Filled 2014-11-08: qty 1

## 2014-11-08 MED ORDER — ONDANSETRON 8 MG/50ML IVPB (CHCC)
8.0000 mg | Freq: Once | INTRAVENOUS | Status: AC
  Administered 2014-11-08: 8 mg via INTRAVENOUS

## 2014-11-08 MED ORDER — DEXAMETHASONE SODIUM PHOSPHATE 10 MG/ML IJ SOLN
10.0000 mg | Freq: Once | INTRAMUSCULAR | Status: AC
  Administered 2014-11-08: 10 mg via INTRAVENOUS

## 2014-11-08 NOTE — Patient Instructions (Signed)
[facility] Discharge Instructions for Patients Receiving Chemotherapy  Today you received the following chemotherapy agents Kyprolis To help prevent nausea and vomiting after your treatment, we encourage you to take your nausea medication as prescribed.  If you develop nausea and vomiting that is not controlled by your nausea medication, call the clinic.   BELOW ARE SYMPTOMS THAT SHOULD BE REPORTED IMMEDIATELY:  *FEVER GREATER THAN 100.5 F  *CHILLS WITH OR WITHOUT FEVER  NAUSEA AND VOMITING THAT IS NOT CONTROLLED WITH YOUR NAUSEA MEDICATION  *UNUSUAL SHORTNESS OF BREATH  *UNUSUAL BRUISING OR BLEEDING  TENDERNESS IN MOUTH AND THROAT WITH OR WITHOUT PRESENCE OF ULCERS  *URINARY PROBLEMS  *BOWEL PROBLEMS  UNUSUAL RASH Items with * indicate a potential emergency and should be followed up as soon as possible.  Feel free to call the clinic you have any questions or concerns. The clinic phone number is [phone].    

## 2014-11-10 ENCOUNTER — Ambulatory Visit (HOSPITAL_BASED_OUTPATIENT_CLINIC_OR_DEPARTMENT_OTHER): Payer: Medicare Other

## 2014-11-10 DIAGNOSIS — Z5112 Encounter for antineoplastic immunotherapy: Secondary | ICD-10-CM

## 2014-11-10 DIAGNOSIS — C9000 Multiple myeloma not having achieved remission: Secondary | ICD-10-CM

## 2014-11-10 MED ORDER — CARFILZOMIB CHEMO INJECTION 60 MG
15.0000 mg/m2 | Freq: Once | INTRAVENOUS | Status: AC
  Administered 2014-11-10: 26 mg via INTRAVENOUS
  Filled 2014-11-10: qty 13

## 2014-11-10 MED ORDER — DEXAMETHASONE SODIUM PHOSPHATE 10 MG/ML IJ SOLN
INTRAMUSCULAR | Status: AC
  Filled 2014-11-10: qty 1

## 2014-11-10 MED ORDER — DEXAMETHASONE SODIUM PHOSPHATE 10 MG/ML IJ SOLN
10.0000 mg | Freq: Once | INTRAMUSCULAR | Status: AC
  Administered 2014-11-10: 10 mg via INTRAVENOUS

## 2014-11-10 MED ORDER — ONDANSETRON 8 MG/50ML IVPB (CHCC)
8.0000 mg | Freq: Once | INTRAVENOUS | Status: AC
  Administered 2014-11-10: 8 mg via INTRAVENOUS

## 2014-11-10 MED ORDER — ONDANSETRON 8 MG/NS 50 ML IVPB
INTRAVENOUS | Status: AC
  Filled 2014-11-10: qty 8

## 2014-11-10 MED ORDER — SODIUM CHLORIDE 0.9 % IV SOLN
Freq: Once | INTRAVENOUS | Status: AC
  Administered 2014-11-10: 14:00:00 via INTRAVENOUS

## 2014-11-10 NOTE — Patient Instructions (Signed)
[facility] Discharge Instructions for Patients Receiving Chemotherapy  Today you received the following chemotherapy agents Kyprolis.  To help prevent nausea and vomiting after your treatment, we encourage you to take your nausea medication.   If you develop nausea and vomiting that is not controlled by your nausea medication, call the clinic.   BELOW ARE SYMPTOMS THAT SHOULD BE REPORTED IMMEDIATELY:  *FEVER GREATER THAN 100.5 F  *CHILLS WITH OR WITHOUT FEVER  NAUSEA AND VOMITING THAT IS NOT CONTROLLED WITH YOUR NAUSEA MEDICATION  *UNUSUAL SHORTNESS OF BREATH  *UNUSUAL BRUISING OR BLEEDING  TENDERNESS IN MOUTH AND THROAT WITH OR WITHOUT PRESENCE OF ULCERS  *URINARY PROBLEMS  *BOWEL PROBLEMS  UNUSUAL RASH Items with * indicate a potential emergency and should be followed up as soon as possible.  Feel free to call the clinic you have any questions or concerns. The clinic phone number is [phone].    

## 2014-11-12 ENCOUNTER — Ambulatory Visit (HOSPITAL_BASED_OUTPATIENT_CLINIC_OR_DEPARTMENT_OTHER): Payer: Medicare Other

## 2014-11-12 DIAGNOSIS — C9000 Multiple myeloma not having achieved remission: Secondary | ICD-10-CM

## 2014-11-12 DIAGNOSIS — D61818 Other pancytopenia: Secondary | ICD-10-CM

## 2014-11-12 MED ORDER — TBO-FILGRASTIM 480 MCG/0.8ML ~~LOC~~ SOSY
480.0000 ug | PREFILLED_SYRINGE | Freq: Once | SUBCUTANEOUS | Status: AC
  Administered 2014-11-12: 480 ug via SUBCUTANEOUS
  Filled 2014-11-12: qty 0.8

## 2014-11-16 ENCOUNTER — Ambulatory Visit: Payer: Self-pay | Admitting: Hematology and Oncology

## 2014-11-22 ENCOUNTER — Ambulatory Visit: Payer: Self-pay

## 2014-11-22 ENCOUNTER — Other Ambulatory Visit (HOSPITAL_BASED_OUTPATIENT_CLINIC_OR_DEPARTMENT_OTHER): Payer: Medicare Other

## 2014-11-22 ENCOUNTER — Encounter: Payer: Self-pay | Admitting: Hematology and Oncology

## 2014-11-22 ENCOUNTER — Ambulatory Visit (HOSPITAL_BASED_OUTPATIENT_CLINIC_OR_DEPARTMENT_OTHER): Payer: Medicare Other

## 2014-11-22 ENCOUNTER — Telehealth: Payer: Self-pay | Admitting: Hematology and Oncology

## 2014-11-22 ENCOUNTER — Ambulatory Visit: Payer: Self-pay | Admitting: Hematology and Oncology

## 2014-11-22 ENCOUNTER — Other Ambulatory Visit: Payer: Self-pay

## 2014-11-22 ENCOUNTER — Ambulatory Visit (HOSPITAL_BASED_OUTPATIENT_CLINIC_OR_DEPARTMENT_OTHER): Payer: Medicare Other | Admitting: Hematology and Oncology

## 2014-11-22 VITALS — BP 146/83 | HR 60 | Temp 98.0°F | Resp 18 | Ht 66.0 in | Wt 136.0 lb

## 2014-11-22 DIAGNOSIS — C9000 Multiple myeloma not having achieved remission: Secondary | ICD-10-CM

## 2014-11-22 DIAGNOSIS — J189 Pneumonia, unspecified organism: Secondary | ICD-10-CM

## 2014-11-22 DIAGNOSIS — N186 End stage renal disease: Secondary | ICD-10-CM

## 2014-11-22 DIAGNOSIS — Z5112 Encounter for antineoplastic immunotherapy: Secondary | ICD-10-CM

## 2014-11-22 DIAGNOSIS — Z992 Dependence on renal dialysis: Secondary | ICD-10-CM

## 2014-11-22 DIAGNOSIS — D702 Other drug-induced agranulocytosis: Secondary | ICD-10-CM

## 2014-11-22 DIAGNOSIS — D701 Agranulocytosis secondary to cancer chemotherapy: Secondary | ICD-10-CM

## 2014-11-22 DIAGNOSIS — D630 Anemia in neoplastic disease: Secondary | ICD-10-CM

## 2014-11-22 LAB — CBC WITH DIFFERENTIAL/PLATELET
BASO%: 0.7 % (ref 0.0–2.0)
Basophils Absolute: 0 10*3/uL (ref 0.0–0.1)
EOS%: 2.7 % (ref 0.0–7.0)
Eosinophils Absolute: 0.1 10*3/uL (ref 0.0–0.5)
HCT: 28.4 % — ABNORMAL LOW (ref 34.8–46.6)
HGB: 8.9 g/dL — ABNORMAL LOW (ref 11.6–15.9)
LYMPH%: 52.9 % — ABNORMAL HIGH (ref 14.0–49.7)
MCH: 32.4 pg (ref 25.1–34.0)
MCHC: 31.3 g/dL — ABNORMAL LOW (ref 31.5–36.0)
MCV: 103.3 fL — ABNORMAL HIGH (ref 79.5–101.0)
MONO#: 0.2 10*3/uL (ref 0.1–0.9)
MONO%: 5.8 % (ref 0.0–14.0)
NEUT#: 1.1 10*3/uL — ABNORMAL LOW (ref 1.5–6.5)
NEUT%: 37.9 % — ABNORMAL LOW (ref 38.4–76.8)
Platelets: 170 10*3/uL (ref 145–400)
RBC: 2.75 10*6/uL — ABNORMAL LOW (ref 3.70–5.45)
RDW: 18.1 % — ABNORMAL HIGH (ref 11.2–14.5)
WBC: 2.9 10*3/uL — AB (ref 3.9–10.3)
lymph#: 1.6 10*3/uL (ref 0.9–3.3)

## 2014-11-22 LAB — COMPREHENSIVE METABOLIC PANEL (CC13)
ALT: 15 U/L (ref 0–55)
AST: 22 U/L (ref 5–34)
Albumin: 3 g/dL — ABNORMAL LOW (ref 3.5–5.0)
Alkaline Phosphatase: 54 U/L (ref 40–150)
Anion Gap: 12 mEq/L — ABNORMAL HIGH (ref 3–11)
BUN: 31.6 mg/dL — AB (ref 7.0–26.0)
CO2: 26 meq/L (ref 22–29)
CREATININE: 7.6 mg/dL — AB (ref 0.6–1.1)
Calcium: 9.3 mg/dL (ref 8.4–10.4)
Chloride: 97 mEq/L — ABNORMAL LOW (ref 98–109)
EGFR: 5 mL/min/{1.73_m2} — AB (ref >90)
GLUCOSE: 81 mg/dL (ref 70–140)
POTASSIUM: 3.9 meq/L (ref 3.5–5.1)
Sodium: 135 mEq/L — ABNORMAL LOW (ref 136–145)
TOTAL PROTEIN: 7.3 g/dL (ref 6.4–8.3)
Total Bilirubin: 0.4 mg/dL (ref 0.20–1.20)

## 2014-11-22 MED ORDER — CARFILZOMIB CHEMO INJECTION 60 MG
15.0000 mg/m2 | Freq: Once | INTRAVENOUS | Status: AC
  Administered 2014-11-22: 26 mg via INTRAVENOUS
  Filled 2014-11-22: qty 13

## 2014-11-22 MED ORDER — SODIUM CHLORIDE 0.9 % IV SOLN
Freq: Once | INTRAVENOUS | Status: AC
  Administered 2014-11-22: 16:00:00 via INTRAVENOUS

## 2014-11-22 MED ORDER — ONDANSETRON 8 MG/50ML IVPB (CHCC)
8.0000 mg | Freq: Once | INTRAVENOUS | Status: AC
  Administered 2014-11-22: 8 mg via INTRAVENOUS

## 2014-11-22 MED ORDER — DEXAMETHASONE SODIUM PHOSPHATE 10 MG/ML IJ SOLN
10.0000 mg | Freq: Once | INTRAMUSCULAR | Status: AC
  Administered 2014-11-22: 10 mg via INTRAVENOUS

## 2014-11-22 MED ORDER — DEXAMETHASONE SODIUM PHOSPHATE 10 MG/ML IJ SOLN
INTRAMUSCULAR | Status: AC
  Filled 2014-11-22: qty 1

## 2014-11-22 MED ORDER — SODIUM CHLORIDE 0.9 % IV SOLN: Freq: Once | INTRAVENOUS | Status: DC

## 2014-11-22 MED ORDER — AMOXICILLIN 500 MG PO TABS: 500.0000 mg | ORAL_TABLET | Freq: Every day | ORAL | Status: DC

## 2014-11-22 NOTE — Patient Instructions (Signed)
[facility] Discharge Instructions for Patients Receiving Chemotherapy  Today you received the following chemotherapy agents: Kyprolis.  To help prevent nausea and vomiting after your treatment, we encourage you to take your nausea medication: Zofran 8mg  every 12 hours as needed.   If you develop nausea and vomiting that is not controlled by your nausea medication, call the clinic.   BELOW ARE SYMPTOMS THAT SHOULD BE REPORTED IMMEDIATELY:  *FEVER GREATER THAN 100.5 F  *CHILLS WITH OR WITHOUT FEVER  NAUSEA AND VOMITING THAT IS NOT CONTROLLED WITH YOUR NAUSEA MEDICATION  *UNUSUAL SHORTNESS OF BREATH  *UNUSUAL BRUISING OR BLEEDING  TENDERNESS IN MOUTH AND THROAT WITH OR WITHOUT PRESENCE OF ULCERS  *URINARY PROBLEMS  *BOWEL PROBLEMS  UNUSUAL RASH Items with * indicate a potential emergency and should be followed up as soon as possible.  Feel free to call the clinic you have any questions or concerns. The clinic phone number is [phone].

## 2014-11-22 NOTE — Progress Notes (Signed)
ANC 1.1 and creatinine 7.6 (pt on dialysis)  . OK to treat per [name]

## 2014-11-22 NOTE — Telephone Encounter (Signed)
s.w. pt and r/s appts to later time due to pt transportation did not come...Marland Kitchenper MD ok.

## 2014-11-22 NOTE — Telephone Encounter (Signed)
Pt confirmed labs/ov per 02/22 POF, gave pt AVS... KJ, sent msg to add chemo °

## 2014-11-23 ENCOUNTER — Telehealth: Payer: Self-pay | Admitting: *Deleted

## 2014-11-23 NOTE — Telephone Encounter (Signed)
Per staff message and POF I have scheduled appts. Advised scheduler of appts. JMW  

## 2014-11-23 NOTE — Assessment & Plan Note (Signed)
She will continue hemodialysis 3 times a week through the fistula on Tuesday, Thursday and Saturday.

## 2014-11-23 NOTE — Assessment & Plan Note (Signed)
The patient had recent infection. She continued to have mild productive cough. I recommend another course of oral antibiotic therapy with amoxicillin daily.

## 2014-11-23 NOTE — Assessment & Plan Note (Signed)
This is likely due to recent treatment. The patient denies recent history of fevers, cough, chills, diarrhea or dysuria. She is asymptomatic from the leukopenia. I will observe for now.  I will continue the chemotherapy at current dose without dosage adjustment.  If the leukopenia gets progressive worse in the future, I might have to delay her treatment or adjust the chemotherapy dose. She will get GCSF support on Fridays

## 2014-11-23 NOTE — Assessment & Plan Note (Signed)
She has responded well to treatment. Despite recent setback with infection, I recommend we continue and proceed with treatment without delay. I will recheck her myeloma labs next week. We will discuss further once the patient has achieved remission, whether she once to continue treatment as a form of maintenance therapy versus taking a chemotherapy holiday.

## 2014-11-23 NOTE — Assessment & Plan Note (Signed)
She is not symptomatic and does not require transfusion or erythropoietin stimulating agent right now.

## 2014-11-23 NOTE — Progress Notes (Signed)
[name] OFFICE PROGRESS NOTE  Patient Care Team: Carrie Rakes, MD as PCP - General (Internal Medicine)  SUMMARY OF ONCOLOGIC HISTORY:   Multiple myeloma   03/30/2008 Initial Diagnosis Multiple myeloma   03/30/2008 - 05/01/2012 Chemotherapy Revlimid/Dex then Velcade (started on 07/13/2011 ending on 09/14/2011) without response.  Managed by [name] of [place], [place] [address]   07/24/2010 -  Chemotherapy Received zometa  intermittently over one year.  Creatinine was 1.0 on 07/24/2010. 1.7 on 08/10/2011.    05/01/2012 - 09/01/2012 Chemotherapy Starte Pomalidomide s/p 4 cycles. Referred to [facility] for consideration of ASCT.     10/08/2012 Bone Marrow Biopsy Normocellular marrow with residual/recurrently plasma cell myeloma, 60-70 % of overall marrow celluarity.  Flow: c/w plasma cell neoplasm, plasma cell infiltrate comprises 60-70 % of overall marrow cellularity; FISH: + for 1 q21/CKS1B gain; RB1 (13q14)   10/08/2012 Bone Marrow Biopsy Continued... Cytogenetics showed normal female karyotype.    10/29/2012 Tumor Marker High IgG (6019), high lamda free light chain (409.32) with low IgA, IgM and kappa free light chain.    04/21/2013 Imaging Skeletal survey.  No suspcious lytic or blastic lesions visualized.  Severe multilevel degenerative disc disease in the cervical spine.    04/29/2013 Imaging PeT. No discrete suspicious focus of FDG uptake identified.    05/20/2013 - 06/09/2013 Chemotherapy Started carfilxomib 39 mg on 08/25, 08/26, day 8 (06/02/13), day 9 on 06/03/2013 and on 06/09/2013.     12/03/2013 Treatment Plan Change Last office visit with [name].  She decline further chemotherapy.   Her son passed away and she relocated to [place] to stay with her daughter [name].    03/11/2014 - 03/17/2014 Hospital Admission Admitted for low hemoglobin. [name] consulted.  SPEP,  UPEP and Bone marrow biopsy obtained.    IgG 11,300. M-spike 7.52, kappa lamda ratio 0 with  lambda free light chains totaling 1260.    03/12/2014 Imaging Skeletal survey. 1. Small lytic lesions within the skull and right scapula. 2. Degenerative changes in the spine and knees, right greater than left. 3. Cardiomegaly without pulmonary edema.   03/17/2014 Bone Marrow Biopsy Hypercellular bone marrow tih extensive involvment by plasma cell neoplasm (Plasma cells 85%). FISH: Presence of 2 copies of the IGH/FGFR3, ATM, CCND1/IGH, and p53 probes.  Loss of 13q34 probes in 40% of cells. gain of chromose 12 and either loss of chorm   03/25/2014 -  Chemotherapy Planning Kyprolis 15 mg/m2 plus dexamethasone weekly plus revlimid (71m) daily on 06/29.   06/14/2014 -  Chemotherapy Patient's last dose of Kyprolis was on 05/26/14. Since then chemo held due to neutropenia despite GCSF support. Revlimid also on hold as of 06/14/14   06/28/2014 -  Chemotherapy New cycle of Kyprolis starts today.   07/07/2014 Tumor Marker IgG 3960    07/23/2014 Tumor Marker IgG 3620   10/04/2014 Tumor Marker IgG 2230 with M spike down to 0.06   INTERVAL HISTORY: Please see below for problem oriented charting. She is seen prior to start of treatment. She continued to have productive cough. She complained of mild shortness of breath. Denies fevers or chills.  REVIEW OF SYSTEMS:   Constitutional: Denies fevers, chills or abnormal weight loss Eyes: Denies blurriness of vision Ears, nose, mouth, throat, and face: Denies mucositis or sore throat Cardiovascular: Denies palpitation, chest discomfort or lower extremity swelling Gastrointestinal:  Denies nausea, heartburn or change in bowel habits Skin: Denies abnormal skin rashes Lymphatics: Denies new lymphadenopathy or easy  bruising Neurological:Denies numbness, tingling or new weaknesses Behavioral/Psych: Mood is stable, no new changes  All other systems were reviewed with the patient and are negative.  I have reviewed the past medical history, past surgical history, social  history and family history with the patient and they are unchanged from previous note.  ALLERGIES:  has No Known Allergies.  MEDICATIONS:  Current Outpatient Prescriptions  Medication Sig Dispense Refill  . dexamethasone (DECADRON) 4 MG tablet Take 3 tablets (12 mg) on days 1,8,15, and 22 of chemotherapy. (Patient taking differently: Take 3 tablets (12 mg) on days 1,8,15, and 22 of chemotherapy.  Taking 2 tablets) 24 tablet 2  . furosemide (LASIX) 20 MG tablet Take 1 tablet (20 mg total) by mouth daily. PRN for leg edema 60 tablet 2  . hydrALAZINE (APRESOLINE) 100 MG tablet Take 100 mg by mouth every 8 (eight) hours.    . isosorbide mononitrate (IMDUR) 60 MG 24 hr tablet Take 60 mg by mouth daily.    Marland Kitchen lenalidomide (REVLIMID) 2.5 MG capsule Take 1 capsule (2.5 mg total) by mouth daily. 28 capsule 0  . vitamin C (ASCORBIC ACID) 500 MG tablet Take 500 mg by mouth daily.    Marland Kitchen amoxicillin (AMOXIL) 500 MG tablet Take 1 tablet (500 mg total) by mouth daily. 7 tablet 0  . HYDROcodone-homatropine (HYCODAN) 5-1.5 MG/5ML syrup Take 5 mLs by mouth every 6 (six) hours as needed for cough. (Patient not taking: Reported on 11/03/2014) 120 mL 0  . LORazepam (ATIVAN) 0.5 MG tablet Take 1 tablet (0.5 mg total) by mouth every 6 (six) hours as needed (Nausea or vomiting). (Patient not taking: Reported on 10/16/2014) 30 tablet 0  . Nutritional Supplements (FEEDING SUPPLEMENT, NEPRO CARB STEADY,) LIQD Take 237 mLs by mouth 2 (two) times daily between meals. (Patient not taking: Reported on 10/27/2014) 237 mL 60  . ondansetron (ZOFRAN) 8 MG tablet Take 1 tablet (8 mg total) by mouth 2 (two) times daily. Start the day after chemo for 2 days. Then as needed for nausea or vomiting. (Patient not taking: Reported on 10/27/2014) 30 tablet 1  . prochlorperazine (COMPAZINE) 10 MG tablet Take 1 tablet (10 mg total) by mouth every 6 (six) hours as needed (Nausea or vomiting). (Patient not taking: Reported on 10/16/2014) 30 tablet 1    No current facility-administered medications for this visit.   Facility-Administered Medications Ordered in Other Visits  Medication Dose Route Frequency Provider Last Rate Last Dose  . 0.9 %  sodium chloride infusion   Intravenous Once Aasim Marla Roe, MD        PHYSICAL EXAMINATION: ECOG PERFORMANCE STATUS: 1 - Symptomatic but completely ambulatory  Filed Vitals:   11/22/14 1442  BP: 146/83  Pulse: 60  Temp: 98 F (36.7 C)  Resp: 18   Filed Weights   11/22/14 1442  Weight: 136 lb (61.689 kg)    GENERAL:alert, no distress and comfortable SKIN: skin color, texture, turgor are normal, no rashes or significant lesions EYES: normal, Conjunctiva are pink and non-injected, sclera clear OROPHARYNX:no exudate, no erythema and lips, buccal mucosa, and tongue normal  NECK: supple, thyroid normal size, non-tender, without nodularity LYMPH:  no palpable lymphadenopathy in the cervical, axillary or inguinal LUNGS: Noted mild crackles in the right upper base. Clear on auscultation without wheezes. HEART: regular rate & rhythm and no murmurs and no lower extremity edema ABDOMEN:abdomen soft, non-tender and normal bowel sounds Musculoskeletal:no cyanosis of digits and no clubbing  NEURO: alert & oriented x 3  with fluent speech, no focal motor/sensory deficits  LABORATORY DATA:  I have reviewed the data as listed    Component Value Date/Time   NA 135* 11/22/2014 1418   NA 131* 10/19/2014 1245   K 3.9 11/22/2014 1418   K 3.9 10/19/2014 1245   CL 96 10/19/2014 1245   CO2 26 11/22/2014 1418   CO2 24 10/19/2014 1245   GLUCOSE 81 11/22/2014 1418   GLUCOSE 89 10/19/2014 1245   BUN 31.6* 11/22/2014 1418   BUN 46* 10/19/2014 1245   CREATININE 7.6* 11/22/2014 1418   CREATININE 11.03* 10/19/2014 1245   CALCIUM 9.3 11/22/2014 1418   CALCIUM 8.3* 10/19/2014 1245   PROT 7.3 11/22/2014 1418   PROT 10.3* 05/10/2014 1439   ALBUMIN 3.0* 11/22/2014 1418   ALBUMIN 2.5* 10/19/2014 0441    AST 22 11/22/2014 1418   AST 17 05/10/2014 1439   ALT 15 11/22/2014 1418   ALT 7 05/10/2014 1439   ALKPHOS 54 11/22/2014 1418   ALKPHOS 50 05/10/2014 1439   BILITOT 0.40 11/22/2014 1418   BILITOT 0.4 05/10/2014 1439   GFRNONAA 3* 10/19/2014 1245   GFRAA 3* 10/19/2014 1245    No results found for: SPEP, UPEP  Lab Results  Component Value Date   WBC 2.9* 11/22/2014   NEUTROABS 1.1* 11/22/2014   HGB 8.9* 11/22/2014   HCT 28.4* 11/22/2014   MCV 103.3* 11/22/2014   PLT 170 11/22/2014      Chemistry      Component Value Date/Time   NA 135* 11/22/2014 1418   NA 131* 10/19/2014 1245   K 3.9 11/22/2014 1418   K 3.9 10/19/2014 1245   CL 96 10/19/2014 1245   CO2 26 11/22/2014 1418   CO2 24 10/19/2014 1245   BUN 31.6* 11/22/2014 1418   BUN 46* 10/19/2014 1245   CREATININE 7.6* 11/22/2014 1418   CREATININE 11.03* 10/19/2014 1245      Component Value Date/Time   CALCIUM 9.3 11/22/2014 1418   CALCIUM 8.3* 10/19/2014 1245   ALKPHOS 54 11/22/2014 1418   ALKPHOS 50 05/10/2014 1439   AST 22 11/22/2014 1418   AST 17 05/10/2014 1439   ALT 15 11/22/2014 1418   ALT 7 05/10/2014 1439   BILITOT 0.40 11/22/2014 1418   BILITOT 0.4 05/10/2014 1439      ASSESSMENT & PLAN:  Multiple myeloma She has responded well to treatment. Despite recent setback with infection, I recommend we continue and proceed with treatment without delay. I will recheck her myeloma labs next week. We will discuss further once the patient has achieved remission, whether she once to continue treatment as a form of maintenance therapy versus taking a chemotherapy holiday.   Anemia in neoplastic disease She is not symptomatic and does not require transfusion or erythropoietin stimulating agent right now.   Drug-induced neutropenia This is likely due to recent treatment. The patient denies recent history of fevers, cough, chills, diarrhea or dysuria. She is asymptomatic from the leukopenia. I will observe  for now.  I will continue the chemotherapy at current dose without dosage adjustment.  If the leukopenia gets progressive worse in the future, I might have to delay her treatment or adjust the chemotherapy dose. She will get GCSF support on Fridays    ESRD (end stage renal disease) She will continue hemodialysis 3 times a week through the fistula on Tuesday, Thursday and Saturday.   HCAP (healthcare-associated pneumonia) The patient had recent infection. She continued to have mild productive cough. I recommend  another course of oral antibiotic therapy with amoxicillin daily.    Orders Placed This Encounter  Procedures  . SPEP & IFE with QIG    Standing Status: Future     Number of Occurrences:      Standing Expiration Date: 12/27/2015  . Kappa/lambda light chains    Standing Status: Future     Number of Occurrences:      Standing Expiration Date: 12/27/2015  . Beta 2 microglobulin, serum    Standing Status: Future     Number of Occurrences:      Standing Expiration Date: 12/27/2015   All questions were answered. The patient knows to call the clinic with any problems, questions or concerns. No barriers to learning was detected. I spent 30 minutes counseling the patient face to face. The total time spent in the appointment was 40 minutes and more than 50% was on counseling and review of test results     [facility], [place], MD 11/23/2014 7:46 AM

## 2014-11-24 ENCOUNTER — Ambulatory Visit: Payer: Self-pay

## 2014-11-25 ENCOUNTER — Other Ambulatory Visit: Payer: Self-pay | Admitting: Nurse Practitioner

## 2014-11-26 ENCOUNTER — Ambulatory Visit (HOSPITAL_COMMUNITY)
Admission: RE | Admit: 2014-11-26 | Discharge: 2014-11-26 | Disposition: A | Discharge status: Home / Self Care | Payer: Medicare Other | Source: Ambulatory Visit | Attending: Internal Medicine | Admitting: Internal Medicine

## 2014-11-26 ENCOUNTER — Ambulatory Visit (HOSPITAL_BASED_OUTPATIENT_CLINIC_OR_DEPARTMENT_OTHER): Payer: Medicare Other

## 2014-11-26 DIAGNOSIS — D61818 Other pancytopenia: Secondary | ICD-10-CM

## 2014-11-26 DIAGNOSIS — J189 Pneumonia, unspecified organism: Secondary | ICD-10-CM

## 2014-11-26 DIAGNOSIS — Y95 Nosocomial condition: Secondary | ICD-10-CM | POA: Insufficient documentation

## 2014-11-26 DIAGNOSIS — D701 Agranulocytosis secondary to cancer chemotherapy: Secondary | ICD-10-CM

## 2014-11-26 MED ORDER — TBO-FILGRASTIM 480 MCG/0.8ML ~~LOC~~ SOSY
480.0000 ug | PREFILLED_SYRINGE | Freq: Once | SUBCUTANEOUS | Status: AC
  Administered 2014-11-26: 480 ug via SUBCUTANEOUS
  Filled 2014-11-26: qty 0.8

## 2014-11-29 ENCOUNTER — Other Ambulatory Visit (HOSPITAL_BASED_OUTPATIENT_CLINIC_OR_DEPARTMENT_OTHER): Payer: Medicare Other

## 2014-11-29 ENCOUNTER — Ambulatory Visit (HOSPITAL_BASED_OUTPATIENT_CLINIC_OR_DEPARTMENT_OTHER): Payer: Medicare Other

## 2014-11-29 DIAGNOSIS — Z5112 Encounter for antineoplastic immunotherapy: Secondary | ICD-10-CM

## 2014-11-29 DIAGNOSIS — N186 End stage renal disease: Secondary | ICD-10-CM

## 2014-11-29 DIAGNOSIS — C9000 Multiple myeloma not having achieved remission: Secondary | ICD-10-CM

## 2014-11-29 LAB — COMPREHENSIVE METABOLIC PANEL (CC13)
ALT: 10 U/L (ref 0–55)
AST: 16 U/L (ref 5–34)
Albumin: 3 g/dL — ABNORMAL LOW (ref 3.5–5.0)
Alkaline Phosphatase: 44 U/L (ref 40–150)
Anion Gap: 13 mEq/L — ABNORMAL HIGH (ref 3–11)
BUN: 40.6 mg/dL — ABNORMAL HIGH (ref 7.0–26.0)
CALCIUM: 9.7 mg/dL (ref 8.4–10.4)
CO2: 25 mEq/L (ref 22–29)
CREATININE: 9.9 mg/dL — AB (ref 0.6–1.1)
Chloride: 96 mEq/L — ABNORMAL LOW (ref 98–109)
EGFR: 4 mL/min/{1.73_m2} — ABNORMAL LOW (ref >90)
Glucose: 113 mg/dl (ref 70–140)
Potassium: 3.3 mEq/L — ABNORMAL LOW (ref 3.5–5.1)
Sodium: 134 mEq/L — ABNORMAL LOW (ref 136–145)
TOTAL PROTEIN: 7.2 g/dL (ref 6.4–8.3)
Total Bilirubin: 0.33 mg/dL (ref 0.20–1.20)

## 2014-11-29 LAB — CBC WITH DIFFERENTIAL/PLATELET
BASO%: 0.5 % (ref 0.0–2.0)
Basophils Absolute: 0 10*3/uL (ref 0.0–0.1)
EOS ABS: 0.1 10*3/uL (ref 0.0–0.5)
EOS%: 1.6 % (ref 0.0–7.0)
HCT: 28.8 % — ABNORMAL LOW (ref 34.8–46.6)
HGB: 9.2 g/dL — ABNORMAL LOW (ref 11.6–15.9)
LYMPH#: 0.9 10*3/uL (ref 0.9–3.3)
LYMPH%: 23.4 % (ref 14.0–49.7)
MCH: 33.1 pg (ref 25.1–34.0)
MCHC: 31.9 g/dL (ref 31.5–36.0)
MCV: 103.6 fL — ABNORMAL HIGH (ref 79.5–101.0)
MONO#: 0.4 10*3/uL (ref 0.1–0.9)
MONO%: 11.7 % (ref 0.0–14.0)
NEUT#: 2.3 10*3/uL (ref 1.5–6.5)
NEUT%: 62.8 % (ref 38.4–76.8)
PLATELETS: 226 10*3/uL (ref 145–400)
RBC: 2.78 10*6/uL — AB (ref 3.70–5.45)
RDW: 17.8 % — AB (ref 11.2–14.5)
WBC: 3.7 10*3/uL — AB (ref 3.9–10.3)

## 2014-11-29 MED ORDER — DEXAMETHASONE SODIUM PHOSPHATE 10 MG/ML IJ SOLN
INTRAMUSCULAR | Status: AC
  Filled 2014-11-29: qty 1

## 2014-11-29 MED ORDER — DEXAMETHASONE SODIUM PHOSPHATE 10 MG/ML IJ SOLN
10.0000 mg | Freq: Once | INTRAMUSCULAR | Status: AC
  Administered 2014-11-29: 10 mg via INTRAVENOUS

## 2014-11-29 MED ORDER — DEXTROSE 5 % IV SOLN
15.0000 mg/m2 | Freq: Once | INTRAVENOUS | Status: AC
  Administered 2014-11-29: 26 mg via INTRAVENOUS
  Filled 2014-11-29: qty 13

## 2014-11-29 MED ORDER — ONDANSETRON 8 MG/50ML IVPB (CHCC)
8.0000 mg | Freq: Once | INTRAVENOUS | Status: AC
  Administered 2014-11-29: 8 mg via INTRAVENOUS

## 2014-11-29 MED ORDER — SODIUM CHLORIDE 0.9 % IV SOLN: Freq: Once | INTRAVENOUS | Status: DC

## 2014-11-29 MED ORDER — SODIUM CHLORIDE 0.9 % IV SOLN
Freq: Once | INTRAVENOUS | Status: AC
  Administered 2014-11-29: 13:00:00 via INTRAVENOUS

## 2014-11-29 MED ORDER — ONDANSETRON 8 MG/NS 50 ML IVPB
INTRAVENOUS | Status: AC
  Filled 2014-11-29: qty 8

## 2014-11-29 NOTE — Progress Notes (Signed)
OK to treat despite K+ & creat results per Dr Alvy Bimler.

## 2014-11-29 NOTE — Patient Instructions (Signed)
[facility] Discharge Instructions for Patients Receiving Chemotherapy  Today you received the following chemotherapy agent: Kyprolis  To help prevent nausea and vomiting after your treatment, we encourage you to take your nausea medication as prescribed.    If you develop nausea and vomiting that is not controlled by your nausea medication, call the clinic.   BELOW ARE SYMPTOMS THAT SHOULD BE REPORTED IMMEDIATELY:  *FEVER GREATER THAN 100.5 F  *CHILLS WITH OR WITHOUT FEVER  NAUSEA AND VOMITING THAT IS NOT CONTROLLED WITH YOUR NAUSEA MEDICATION  *UNUSUAL SHORTNESS OF BREATH  *UNUSUAL BRUISING OR BLEEDING  TENDERNESS IN MOUTH AND THROAT WITH OR WITHOUT PRESENCE OF ULCERS  *URINARY PROBLEMS  *BOWEL PROBLEMS  UNUSUAL RASH Items with * indicate a potential emergency and should be followed up as soon as possible.  Feel free to call the clinic you have any questions or concerns. The clinic phone number is [phone].    

## 2014-12-01 ENCOUNTER — Other Ambulatory Visit: Payer: Self-pay | Admitting: Hematology and Oncology

## 2014-12-01 ENCOUNTER — Ambulatory Visit (HOSPITAL_BASED_OUTPATIENT_CLINIC_OR_DEPARTMENT_OTHER): Payer: Medicare Other

## 2014-12-01 DIAGNOSIS — Z5112 Encounter for antineoplastic immunotherapy: Secondary | ICD-10-CM

## 2014-12-01 DIAGNOSIS — C9000 Multiple myeloma not having achieved remission: Secondary | ICD-10-CM

## 2014-12-01 LAB — KAPPA/LAMBDA LIGHT CHAINS
Kappa free light chain: 1.52 mg/dL (ref 0.33–1.94)
Kappa:Lambda Ratio: 0 — ABNORMAL LOW (ref 0.26–1.65)
Lambda Free Lght Chn: 330 mg/dL — ABNORMAL HIGH (ref 0.57–2.63)

## 2014-12-01 LAB — SPEP & IFE WITH QIG
Albumin ELP: 51.3 % — ABNORMAL LOW (ref 55.8–66.1)
Alpha-1-Globulin: 4.2 % (ref 2.9–4.9)
Alpha-2-Globulin: 7.6 % (ref 7.1–11.8)
BETA 2: 2.4 % — AB (ref 3.2–6.5)
BETA GLOBULIN: 3.6 % — AB (ref 4.7–7.2)
GAMMA GLOBULIN: 30.9 % — AB (ref 11.1–18.8)
IgA: <6 mg/dL — ABNORMAL LOW (ref 69–380)
IgG (Immunoglobin G), Serum: 2990 mg/dL — ABNORMAL HIGH (ref 690–1700)
IgM, Serum: <5 mg/dL — ABNORMAL LOW (ref 52–322)
M-SPIKE, %: 0.06 g/dL
Total Protein, Serum Electrophoresis: 7.2 g/dL (ref 6.0–8.3)

## 2014-12-01 LAB — BETA 2 MICROGLOBULIN, SERUM: Beta-2 Microglobulin: 46.3 mg/L — ABNORMAL HIGH (ref <=2.51)

## 2014-12-01 MED ORDER — DEXAMETHASONE SODIUM PHOSPHATE 10 MG/ML IJ SOLN
10.0000 mg | Freq: Once | INTRAMUSCULAR | Status: AC
Start: 2014-12-01 — End: 2014-12-01
  Administered 2014-12-01: 10 mg via INTRAVENOUS

## 2014-12-01 MED ORDER — ONDANSETRON 8 MG/50ML IVPB (CHCC)
8.0000 mg | Freq: Once | INTRAVENOUS | Status: AC
  Administered 2014-12-01: 8 mg via INTRAVENOUS

## 2014-12-01 MED ORDER — DEXAMETHASONE SODIUM PHOSPHATE 10 MG/ML IJ SOLN
INTRAMUSCULAR | Status: AC
  Filled 2014-12-01: qty 1

## 2014-12-01 MED ORDER — DEXTROSE 5 % IV SOLN
15.0000 mg/m2 | Freq: Once | INTRAVENOUS | Status: AC
  Administered 2014-12-01: 26 mg via INTRAVENOUS
  Filled 2014-12-01: qty 13

## 2014-12-01 MED ORDER — ONDANSETRON 8 MG/NS 50 ML IVPB
INTRAVENOUS | Status: AC
  Filled 2014-12-01: qty 8

## 2014-12-01 MED ORDER — SODIUM CHLORIDE 0.9 % IV SOLN
Freq: Once | INTRAVENOUS | Status: AC
  Administered 2014-12-01: 13:00:00 via INTRAVENOUS

## 2014-12-01 MED ORDER — SODIUM CHLORIDE 0.9 % IV SOLN: Freq: Once | INTRAVENOUS | Status: DC

## 2014-12-01 NOTE — Patient Instructions (Signed)
[facility] Discharge Instructions for Patients Receiving Chemotherapy  Today you received the following chemotherapy agents Kyprolis  To help prevent nausea and vomiting after your treatment, we encourage you to take your nausea medication as directed/prescribed   If you develop nausea and vomiting that is not controlled by your nausea medication, call the clinic.   BELOW ARE SYMPTOMS THAT SHOULD BE REPORTED IMMEDIATELY:  *FEVER GREATER THAN 100.5 F  *CHILLS WITH OR WITHOUT FEVER  NAUSEA AND VOMITING THAT IS NOT CONTROLLED WITH YOUR NAUSEA MEDICATION  *UNUSUAL SHORTNESS OF BREATH  *UNUSUAL BRUISING OR BLEEDING  TENDERNESS IN MOUTH AND THROAT WITH OR WITHOUT PRESENCE OF ULCERS  *URINARY PROBLEMS  *BOWEL PROBLEMS  UNUSUAL RASH Items with * indicate a potential emergency and should be followed up as soon as possible.  Feel free to call the clinic you have any questions or concerns. The clinic phone number is [phone].

## 2014-12-03 ENCOUNTER — Ambulatory Visit (HOSPITAL_BASED_OUTPATIENT_CLINIC_OR_DEPARTMENT_OTHER): Payer: Medicare Other

## 2014-12-03 ENCOUNTER — Other Ambulatory Visit: Payer: Self-pay | Admitting: Hematology and Oncology

## 2014-12-03 DIAGNOSIS — C9000 Multiple myeloma not having achieved remission: Secondary | ICD-10-CM

## 2014-12-03 DIAGNOSIS — D61818 Other pancytopenia: Secondary | ICD-10-CM

## 2014-12-03 DIAGNOSIS — D701 Agranulocytosis secondary to cancer chemotherapy: Secondary | ICD-10-CM

## 2014-12-03 MED ORDER — TBO-FILGRASTIM 480 MCG/0.8ML ~~LOC~~ SOSY
480.0000 ug | PREFILLED_SYRINGE | Freq: Once | SUBCUTANEOUS | Status: AC
  Administered 2014-12-03: 480 ug via SUBCUTANEOUS
  Filled 2014-12-03: qty 0.8

## 2014-12-06 ENCOUNTER — Ambulatory Visit (HOSPITAL_BASED_OUTPATIENT_CLINIC_OR_DEPARTMENT_OTHER): Payer: Medicare Other

## 2014-12-06 ENCOUNTER — Other Ambulatory Visit (HOSPITAL_BASED_OUTPATIENT_CLINIC_OR_DEPARTMENT_OTHER): Payer: Medicare Other

## 2014-12-06 DIAGNOSIS — Z5112 Encounter for antineoplastic immunotherapy: Secondary | ICD-10-CM

## 2014-12-06 DIAGNOSIS — D701 Agranulocytosis secondary to cancer chemotherapy: Secondary | ICD-10-CM

## 2014-12-06 DIAGNOSIS — C9000 Multiple myeloma not having achieved remission: Secondary | ICD-10-CM

## 2014-12-06 DIAGNOSIS — D630 Anemia in neoplastic disease: Secondary | ICD-10-CM

## 2014-12-06 DIAGNOSIS — N186 End stage renal disease: Secondary | ICD-10-CM

## 2014-12-06 LAB — COMPREHENSIVE METABOLIC PANEL (CC13)
ALBUMIN: 3.1 g/dL — AB (ref 3.5–5.0)
ALK PHOS: 44 U/L (ref 40–150)
ALT: 9 U/L (ref 0–55)
ANION GAP: 13 meq/L — AB (ref 3–11)
AST: 16 U/L (ref 5–34)
BUN: 45 mg/dL — ABNORMAL HIGH (ref 7.0–26.0)
CO2: 25 mEq/L (ref 22–29)
Calcium: 9.6 mg/dL (ref 8.4–10.4)
Chloride: 95 mEq/L — ABNORMAL LOW (ref 98–109)
Creatinine: 8.1 mg/dL (ref 0.6–1.1)
EGFR: 5 mL/min/{1.73_m2} — AB (ref >90)
GLUCOSE: 89 mg/dL (ref 70–140)
POTASSIUM: 3.8 meq/L (ref 3.5–5.1)
Sodium: 133 mEq/L — ABNORMAL LOW (ref 136–145)
Total Bilirubin: 0.36 mg/dL (ref 0.20–1.20)
Total Protein: 7.1 g/dL (ref 6.4–8.3)

## 2014-12-06 LAB — CBC WITH DIFFERENTIAL/PLATELET
BASO%: 0.5 % (ref 0.0–2.0)
BASOS ABS: 0 10*3/uL (ref 0.0–0.1)
EOS%: 2.2 % (ref 0.0–7.0)
Eosinophils Absolute: 0.1 10*3/uL (ref 0.0–0.5)
HEMATOCRIT: 31.8 % — AB (ref 34.8–46.6)
HEMOGLOBIN: 10 g/dL — AB (ref 11.6–15.9)
LYMPH%: 18.8 % (ref 14.0–49.7)
MCH: 33.2 pg (ref 25.1–34.0)
MCHC: 31.4 g/dL — AB (ref 31.5–36.0)
MCV: 105.7 fL — ABNORMAL HIGH (ref 79.5–101.0)
MONO#: 0.4 10*3/uL (ref 0.1–0.9)
MONO%: 6.4 % (ref 0.0–14.0)
NEUT#: 4.5 10*3/uL (ref 1.5–6.5)
NEUT%: 72.1 % (ref 38.4–76.8)
Platelets: 150 10*3/uL (ref 145–400)
RBC: 3.01 10*6/uL — ABNORMAL LOW (ref 3.70–5.45)
RDW: 21.2 % — ABNORMAL HIGH (ref 11.2–14.5)
WBC: 6.2 10*3/uL (ref 3.9–10.3)
lymph#: 1.2 10*3/uL (ref 0.9–3.3)

## 2014-12-06 MED ORDER — DEXAMETHASONE SODIUM PHOSPHATE 100 MG/10ML IJ SOLN
Freq: Once | INTRAMUSCULAR | Status: AC
  Administered 2014-12-06: 13:00:00 via INTRAVENOUS
  Filled 2014-12-06: qty 4

## 2014-12-06 MED ORDER — SODIUM CHLORIDE 0.9 % IV SOLN
Freq: Once | INTRAVENOUS | Status: AC
  Administered 2014-12-06: 13:00:00 via INTRAVENOUS

## 2014-12-06 MED ORDER — CARFILZOMIB CHEMO INJECTION 60 MG
15.0000 mg/m2 | Freq: Once | INTRAVENOUS | Status: AC
  Administered 2014-12-06: 26 mg via INTRAVENOUS
  Filled 2014-12-06: qty 13

## 2014-12-06 MED ORDER — DEXAMETHASONE SODIUM PHOSPHATE 10 MG/ML IJ SOLN
10.0000 mg | Freq: Once | INTRAMUSCULAR | Status: DC
  Filled 2014-12-06: qty 1

## 2014-12-06 MED ORDER — ONDANSETRON 8 MG/50ML IVPB (CHCC)
8.0000 mg | Freq: Once | INTRAVENOUS | Status: DC
  Filled 2014-12-06: qty 8

## 2014-12-06 NOTE — Patient Instructions (Signed)
[facility] Discharge Instructions for Patients Receiving Chemotherapy  Today you received the following chemotherapy agents:  Kyprolis  To help prevent nausea and vomiting after your treatment, we encourage you to take your nausea medication as ordered per MD.   If you develop nausea and vomiting that is not controlled by your nausea medication, call the clinic.   BELOW ARE SYMPTOMS THAT SHOULD BE REPORTED IMMEDIATELY:  *FEVER GREATER THAN 100.5 F  *CHILLS WITH OR WITHOUT FEVER  NAUSEA AND VOMITING THAT IS NOT CONTROLLED WITH YOUR NAUSEA MEDICATION  *UNUSUAL SHORTNESS OF BREATH  *UNUSUAL BRUISING OR BLEEDING  TENDERNESS IN MOUTH AND THROAT WITH OR WITHOUT PRESENCE OF ULCERS  *URINARY PROBLEMS  *BOWEL PROBLEMS  UNUSUAL RASH Items with * indicate a potential emergency and should be followed up as soon as possible.  Feel free to call the clinic you have any questions or concerns. The clinic phone number is [phone].

## 2014-12-08 ENCOUNTER — Ambulatory Visit (HOSPITAL_BASED_OUTPATIENT_CLINIC_OR_DEPARTMENT_OTHER): Payer: Medicare Other

## 2014-12-08 ENCOUNTER — Ambulatory Visit (HOSPITAL_BASED_OUTPATIENT_CLINIC_OR_DEPARTMENT_OTHER): Payer: Medicare Other | Admitting: Hematology and Oncology

## 2014-12-08 ENCOUNTER — Other Ambulatory Visit: Payer: Self-pay | Admitting: *Deleted

## 2014-12-08 ENCOUNTER — Encounter: Payer: Self-pay | Admitting: Hematology and Oncology

## 2014-12-08 ENCOUNTER — Telehealth: Payer: Self-pay | Admitting: Hematology and Oncology

## 2014-12-08 VITALS — BP 112/60 | HR 54 | Temp 98.5°F | Resp 16

## 2014-12-08 DIAGNOSIS — D701 Agranulocytosis secondary to cancer chemotherapy: Secondary | ICD-10-CM

## 2014-12-08 DIAGNOSIS — D630 Anemia in neoplastic disease: Secondary | ICD-10-CM

## 2014-12-08 DIAGNOSIS — C9000 Multiple myeloma not having achieved remission: Secondary | ICD-10-CM

## 2014-12-08 DIAGNOSIS — N186 End stage renal disease: Secondary | ICD-10-CM

## 2014-12-08 DIAGNOSIS — Z5112 Encounter for antineoplastic immunotherapy: Secondary | ICD-10-CM

## 2014-12-08 DIAGNOSIS — D702 Other drug-induced agranulocytosis: Secondary | ICD-10-CM

## 2014-12-08 MED ORDER — DEXAMETHASONE SODIUM PHOSPHATE 100 MG/10ML IJ SOLN
Freq: Once | INTRAMUSCULAR | Status: AC
  Administered 2014-12-08: 13:00:00 via INTRAVENOUS
  Filled 2014-12-08: qty 4

## 2014-12-08 MED ORDER — SODIUM CHLORIDE 0.9 % IV SOLN
Freq: Once | INTRAVENOUS | Status: AC
  Administered 2014-12-08: 13:00:00 via INTRAVENOUS

## 2014-12-08 MED ORDER — CARFILZOMIB CHEMO INJECTION 60 MG
15.0000 mg/m2 | Freq: Once | INTRAVENOUS | Status: AC
  Administered 2014-12-08: 26 mg via INTRAVENOUS
  Filled 2014-12-08: qty 13

## 2014-12-08 NOTE — Telephone Encounter (Signed)
gv adn printed appt sched and avs for pt for march...sed added tx

## 2014-12-08 NOTE — Assessment & Plan Note (Signed)
She is not symptomatic and does not require transfusion or erythropoietin stimulating agent right now.

## 2014-12-08 NOTE — Progress Notes (Signed)
[name] here to see patient.  OK to treat with labs from 12/06/14

## 2014-12-08 NOTE — Assessment & Plan Note (Signed)
This is likely due to recent treatment. The patient denies recent history of fevers, cough, chills, diarrhea or dysuria. She is asymptomatic from the leukopenia. I will observe for now.  I will continue the chemotherapy at current dose without dosage adjustment.  If the leukopenia gets progressive worse in the future, I might have to delay her treatment or adjust the chemotherapy dose. She will get GCSF support on Fridays

## 2014-12-08 NOTE — Patient Instructions (Signed)
[facility] Discharge Instructions for Patients Receiving Chemotherapy  Today you received the following chemotherapy agents:  Kyprolis  To help prevent nausea and vomiting after your treatment, we encourage you to take your nausea medication as ordered per MD.   If you develop nausea and vomiting that is not controlled by your nausea medication, call the clinic.   BELOW ARE SYMPTOMS THAT SHOULD BE REPORTED IMMEDIATELY:  *FEVER GREATER THAN 100.5 F  *CHILLS WITH OR WITHOUT FEVER  NAUSEA AND VOMITING THAT IS NOT CONTROLLED WITH YOUR NAUSEA MEDICATION  *UNUSUAL SHORTNESS OF BREATH  *UNUSUAL BRUISING OR BLEEDING  TENDERNESS IN MOUTH AND THROAT WITH OR WITHOUT PRESENCE OF ULCERS  *URINARY PROBLEMS  *BOWEL PROBLEMS  UNUSUAL RASH Items with * indicate a potential emergency and should be followed up as soon as possible.  Feel free to call the clinic you have any questions or concerns. The clinic phone number is [phone].

## 2014-12-08 NOTE — Assessment & Plan Note (Signed)
Her most recent repeat myeloma M spike protein is still stable but the IgG and kappa light chain were getting worse. I will recheck her myeloma labs again at the end of the month.  in the meantime, we will proceed with treatment with current dose adjustment.

## 2014-12-08 NOTE — Assessment & Plan Note (Signed)
She will continue hemodialysis 3 times a week through the fistula on Tuesday, Thursday and Saturday.

## 2014-12-08 NOTE — Progress Notes (Signed)
[name] OFFICE PROGRESS NOTE  Patient Care Team: Riccardo Dubin, MD as PCP - General (Internal Medicine)  SUMMARY OF ONCOLOGIC HISTORY:   Multiple myeloma   03/30/2008 Initial Diagnosis Multiple myeloma   03/30/2008 - 05/01/2012 Chemotherapy Revlimid/Dex then Velcade (started on 07/13/2011 ending on 09/14/2011) without response.  Managed by Dr. Chauncey Fischer of [place], [place] [address]   07/24/2010 -  Chemotherapy Received zometa  intermittently over one year.  Creatinine was 1.0 on 07/24/2010. 1.7 on 08/10/2011.    05/01/2012 - 09/01/2012 Chemotherapy Starte Pomalidomide s/p 4 cycles. Referred to [facility] for consideration of ASCT.     10/08/2012 Bone Marrow Biopsy Normocellular marrow with residual/recurrently plasma cell myeloma, 60-70 % of overall marrow celluarity.  Flow: c/w plasma cell neoplasm, plasma cell infiltrate comprises 60-70 % of overall marrow cellularity; FISH: + for 1 q21/CKS1B gain; RB1 (13q14)   10/08/2012 Bone Marrow Biopsy Continued... Cytogenetics showed normal female karyotype.    10/29/2012 Tumor Marker High IgG (6019), high lamda free light chain (409.32) with low IgA, IgM and kappa free light chain.    04/21/2013 Imaging Skeletal survey.  No suspcious lytic or blastic lesions visualized.  Severe multilevel degenerative disc disease in the cervical spine.    04/29/2013 Imaging PeT. No discrete suspicious focus of FDG uptake identified.    05/20/2013 - 06/09/2013 Chemotherapy Started carfilxomib 39 mg on 08/25, 08/26, day 8 (06/02/13), day 9 on 06/03/2013 and on 06/09/2013.     12/03/2013 Treatment Plan Change Last office visit with Dr. Glenna Durand.  [name] decline further chemotherapy.   Her son passed away and [name] relocated to [place] to stay with her daughter [name].    03/11/2014 - 03/17/2014 Hospital Admission Admitted for low hemoglobin. Dr. Burney Gauze consulted.  SPEP,  UPEP and Bone marrow biopsy obtained.    IgG 11,300. M-spike 7.52, kappa lamda ratio 0  with lambda free light chains totaling 1260.    03/12/2014 Imaging Skeletal survey. 1. Small lytic lesions within the skull and right scapula. 2. Degenerative changes in the spine and knees, right greater than left. 3. Cardiomegaly without pulmonary edema.   03/17/2014 Bone Marrow Biopsy Hypercellular bone marrow tih extensive involvment by plasma cell neoplasm (Plasma cells 85%). FISH: Presence of 2 copies of the IGH/FGFR3, ATM, CCND1/IGH, and p53 probes.  Loss of 13q34 probes in 40% of cells. gain of chromose 12 and either loss of chorm   03/25/2014 -  Chemotherapy Planning Kyprolis 15 mg/m2 plus dexamethasone weekly plus revlimid (43m) daily on 06/29.   06/14/2014 -  Chemotherapy Patient's last dose of Kyprolis was on 05/26/14. Since then chemo held due to neutropenia despite GCSF support. Revlimid also on hold as of 06/14/14   06/28/2014 -  Chemotherapy New cycle of Kyprolis starts today.   07/07/2014 Tumor Marker IgG 3960    07/23/2014 Tumor Marker IgG 3620   10/04/2014 Tumor Marker IgG 2230 with M spike down to 0.06    INTERVAL HISTORY: Please see below for problem oriented charting. [name] is seen at the infusion center. [name] denies recent new bone pain or infection. [name] continues to tolerate hemodialysis well.  REVIEW OF SYSTEMS:   Constitutional: Denies fevers, chills or abnormal weight loss Eyes: Denies blurriness of vision Ears, nose, mouth, throat, and face: Denies mucositis or sore throat Respiratory: Denies cough, dyspnea or wheezes Cardiovascular: Denies palpitation, chest discomfort or lower extremity swelling Gastrointestinal:  Denies nausea, heartburn or change in bowel habits Skin: Denies abnormal skin rashes Lymphatics: Denies  new lymphadenopathy or easy bruising Neurological:Denies numbness, tingling or new weaknesses Behavioral/Psych: Mood is stable, no new changes  All other systems were reviewed with the patient and are negative.  I have reviewed the past medical history, past  surgical history, social history and family history with the patient and they are unchanged from previous note.  ALLERGIES:  has No Known Allergies.  MEDICATIONS:  Current Outpatient Prescriptions  Medication Sig Dispense Refill  . amoxicillin (AMOXIL) 500 MG tablet Take 1 tablet (500 mg total) by mouth daily. 7 tablet 0  . dexamethasone (DECADRON) 4 MG tablet Take 3 tablets (12 mg) on days 1,8,15, and 22 of chemotherapy. (Patient taking differently: Take 3 tablets (12 mg) on days 1,8,15, and 22 of chemotherapy.  Taking 2 tablets) 24 tablet 2  . furosemide (LASIX) 20 MG tablet Take 1 tablet (20 mg total) by mouth daily. PRN for leg edema 60 tablet 2  . hydrALAZINE (APRESOLINE) 100 MG tablet Take 100 mg by mouth every 8 (eight) hours.    Marland Kitchen HYDROcodone-homatropine (HYCODAN) 5-1.5 MG/5ML syrup Take 5 mLs by mouth every 6 (six) hours as needed for cough. (Patient not taking: Reported on 11/03/2014) 120 mL 0  . isosorbide mononitrate (IMDUR) 60 MG 24 hr tablet Take 60 mg by mouth daily.    Marland Kitchen lenalidomide (REVLIMID) 2.5 MG capsule Take 1 capsule (2.5 mg total) by mouth daily. 28 capsule 0  . LORazepam (ATIVAN) 0.5 MG tablet Take 1 tablet (0.5 mg total) by mouth every 6 (six) hours as needed (Nausea or vomiting). (Patient not taking: Reported on 10/16/2014) 30 tablet 0  . Nutritional Supplements (FEEDING SUPPLEMENT, NEPRO CARB STEADY,) LIQD Take 237 mLs by mouth 2 (two) times daily between meals. (Patient not taking: Reported on 10/27/2014) 237 mL 60  . ondansetron (ZOFRAN) 8 MG tablet Take 1 tablet (8 mg total) by mouth 2 (two) times daily. Start the day after chemo for 2 days. Then as needed for nausea or vomiting. (Patient not taking: Reported on 10/27/2014) 30 tablet 1  . prochlorperazine (COMPAZINE) 10 MG tablet Take 1 tablet (10 mg total) by mouth every 6 (six) hours as needed (Nausea or vomiting). (Patient not taking: Reported on 10/16/2014) 30 tablet 1  . vitamin C (ASCORBIC ACID) 500 MG tablet Take  500 mg by mouth daily.     No current facility-administered medications for this visit.   Facility-Administered Medications Ordered in Other Visits  Medication Dose Route Frequency Provider Last Rate Last Dose  . 0.9 %  sodium chloride infusion   Intravenous Once Aasim Lona Kettle, MD        PHYSICAL EXAMINATION: ECOG PERFORMANCE STATUS: 1 - Symptomatic but completely ambulatory  Filed Vitals:   12/08/14 1314  BP: 112/60  Pulse: 54  Temp: 98.5 F (36.9 C)  Resp: 16   There were no vitals filed for this visit.  GENERAL:alert, no distress and comfortable. [name] looks thin SKIN: skin color, texture, turgor are normal, no rashes or significant lesions EYES: normal, Conjunctiva are pale and non-injected, sclera clear OROPHARYNX:no exudate, no erythema and lips, buccal mucosa, and tongue normal . Poor dentition is noted Musculoskeletal:no cyanosis of digits and no clubbing  NEURO: alert & oriented x 3 with fluent speech, no focal motor/sensory deficits  LABORATORY DATA:  I have reviewed the data as listed    Component Value Date/Time   NA 133* 12/06/2014 1109   NA 131* 10/19/2014 1245   K 3.8 12/06/2014 1109   K 3.9 10/19/2014 1245  CL 96 10/19/2014 1245   CO2 25 12/06/2014 1109   CO2 24 10/19/2014 1245   GLUCOSE 89 12/06/2014 1109   GLUCOSE 89 10/19/2014 1245   BUN 45.0* 12/06/2014 1109   BUN 46* 10/19/2014 1245   CREATININE 8.1* 12/06/2014 1109   CREATININE 11.03* 10/19/2014 1245   CALCIUM 9.6 12/06/2014 1109   CALCIUM 8.3* 10/19/2014 1245   PROT 7.1 12/06/2014 1109   PROT 10.3* 05/10/2014 1439   ALBUMIN 3.1* 12/06/2014 1109   ALBUMIN 2.5* 10/19/2014 0441   AST 16 12/06/2014 1109   AST 17 05/10/2014 1439   ALT 9 12/06/2014 1109   ALT 7 05/10/2014 1439   ALKPHOS 44 12/06/2014 1109   ALKPHOS 50 05/10/2014 1439   BILITOT 0.36 12/06/2014 1109   BILITOT 0.4 05/10/2014 1439   GFRNONAA 3* 10/19/2014 1245   GFRAA 3* 10/19/2014 1245    No results found for: SPEP,  UPEP  Lab Results  Component Value Date   WBC 6.2 12/06/2014   NEUTROABS 4.5 12/06/2014   HGB 10.0* 12/06/2014   HCT 31.8* 12/06/2014   MCV 105.7* 12/06/2014   PLT 150 12/06/2014      Chemistry      Component Value Date/Time   NA 133* 12/06/2014 1109   NA 131* 10/19/2014 1245   K 3.8 12/06/2014 1109   K 3.9 10/19/2014 1245   CL 96 10/19/2014 1245   CO2 25 12/06/2014 1109   CO2 24 10/19/2014 1245   BUN 45.0* 12/06/2014 1109   BUN 46* 10/19/2014 1245   CREATININE 8.1* 12/06/2014 1109   CREATININE 11.03* 10/19/2014 1245      Component Value Date/Time   CALCIUM 9.6 12/06/2014 1109   CALCIUM 8.3* 10/19/2014 1245   ALKPHOS 44 12/06/2014 1109   ALKPHOS 50 05/10/2014 1439   AST 16 12/06/2014 1109   AST 17 05/10/2014 1439   ALT 9 12/06/2014 1109   ALT 7 05/10/2014 1439   BILITOT 0.36 12/06/2014 1109   BILITOT 0.4 05/10/2014 1439     ASSESSMENT & PLAN:  Multiple myeloma  Her most recent repeat myeloma M spike protein is still stable but the IgG and kappa light chain were getting worse. I will recheck her myeloma labs again at the end of the month.  in the meantime, we will proceed with treatment with current dose adjustment.   Anemia in neoplastic disease [name] is not symptomatic and does not require transfusion or erythropoietin stimulating agent right now.   Drug-induced neutropenia This is likely due to recent treatment. The patient denies recent history of fevers, cough, chills, diarrhea or dysuria. [name] is asymptomatic from the leukopenia. I will observe for now.  I will continue the chemotherapy at current dose without dosage adjustment.  If the leukopenia gets progressive worse in the future, I might have to delay her treatment or adjust the chemotherapy dose. [name] will get GCSF support on Fridays    ESRD (end stage renal disease) [name] will continue hemodialysis 3 times a week through the fistula on Tuesday, Thursday and Saturday.    Orders Placed This Encounter   Procedures  . SPEP & IFE with QIG    Standing Status: Future     Number of Occurrences:      Standing Expiration Date: 01/12/2016  . Kappa/lambda light chains    Standing Status: Future     Number of Occurrences:      Standing Expiration Date: 01/12/2016  . Beta 2 microglobulin, serum    Standing Status: Future  Number of Occurrences:      Standing Expiration Date: 01/12/2016   All questions were answered. The patient knows to call the clinic with any problems, questions or concerns. No barriers to learning was detected. I spent 25 minutes counseling the patient face to face. The total time spent in the appointment was 30 minutes and more than 50% was on counseling and review of test results     [facility], [place], MD 12/08/2014 3:16 PM

## 2014-12-10 ENCOUNTER — Ambulatory Visit: Payer: Self-pay

## 2014-12-10 ENCOUNTER — Telehealth: Payer: Self-pay | Admitting: *Deleted

## 2014-12-10 NOTE — Telephone Encounter (Signed)
INSTRUCTED PT. TO SEE IF SHE COULD GET TRANSPORTATION LATER TODAY. SHE VOICES UNDERSTANDING AND WILL CALL WHEN TRANSPORTATION IS ARRANGED. NOTIFIED JANICE BISHOP,LPN IN THE INJECTION ROOM.

## 2014-12-16 ENCOUNTER — Other Ambulatory Visit: Payer: Self-pay | Admitting: *Deleted

## 2014-12-16 DIAGNOSIS — C9000 Multiple myeloma not having achieved remission: Secondary | ICD-10-CM

## 2014-12-16 MED ORDER — LENALIDOMIDE 2.5 MG PO CAPS: 2.5000 mg | ORAL_CAPSULE | Freq: Every day | ORAL | Status: DC

## 2014-12-20 ENCOUNTER — Other Ambulatory Visit: Payer: Self-pay | Admitting: Hematology

## 2014-12-20 ENCOUNTER — Ambulatory Visit (HOSPITAL_BASED_OUTPATIENT_CLINIC_OR_DEPARTMENT_OTHER): Payer: Medicare Other

## 2014-12-20 ENCOUNTER — Other Ambulatory Visit (HOSPITAL_BASED_OUTPATIENT_CLINIC_OR_DEPARTMENT_OTHER): Payer: Medicare Other

## 2014-12-20 DIAGNOSIS — Z5112 Encounter for antineoplastic immunotherapy: Secondary | ICD-10-CM | POA: Diagnosis present

## 2014-12-20 DIAGNOSIS — D630 Anemia in neoplastic disease: Secondary | ICD-10-CM

## 2014-12-20 DIAGNOSIS — C9000 Multiple myeloma not having achieved remission: Secondary | ICD-10-CM

## 2014-12-20 DIAGNOSIS — N186 End stage renal disease: Secondary | ICD-10-CM | POA: Diagnosis not present

## 2014-12-20 DIAGNOSIS — D701 Agranulocytosis secondary to cancer chemotherapy: Secondary | ICD-10-CM

## 2014-12-20 LAB — CBC WITH DIFFERENTIAL/PLATELET
BASO%: 0.8 % (ref 0.0–2.0)
BASOS ABS: 0 10*3/uL (ref 0.0–0.1)
EOS ABS: 0.1 10*3/uL (ref 0.0–0.5)
EOS%: 4.2 % (ref 0.0–7.0)
HCT: 33.2 % — ABNORMAL LOW (ref 34.8–46.6)
HEMOGLOBIN: 10.4 g/dL — AB (ref 11.6–15.9)
LYMPH#: 1.1 10*3/uL (ref 0.9–3.3)
LYMPH%: 40.9 % (ref 14.0–49.7)
MCH: 33 pg (ref 25.1–34.0)
MCHC: 31.3 g/dL — ABNORMAL LOW (ref 31.5–36.0)
MCV: 105.4 fL — ABNORMAL HIGH (ref 79.5–101.0)
MONO#: 0.3 10*3/uL (ref 0.1–0.9)
MONO%: 10.8 % (ref 0.0–14.0)
NEUT%: 43.3 % (ref 38.4–76.8)
NEUTROS ABS: 1.1 10*3/uL — AB (ref 1.5–6.5)
Platelets: 134 10*3/uL — ABNORMAL LOW (ref 145–400)
RBC: 3.15 10*6/uL — AB (ref 3.70–5.45)
RDW: 18 % — AB (ref 11.2–14.5)
WBC: 2.6 10*3/uL — ABNORMAL LOW (ref 3.9–10.3)

## 2014-12-20 LAB — COMPREHENSIVE METABOLIC PANEL (CC13)
ALBUMIN: 3.2 g/dL — AB (ref 3.5–5.0)
ALT: 23 U/L (ref 0–55)
ANION GAP: 14 meq/L — AB (ref 3–11)
AST: 20 U/L (ref 5–34)
Alkaline Phosphatase: 48 U/L (ref 40–150)
BUN: 45.9 mg/dL — ABNORMAL HIGH (ref 7.0–26.0)
CALCIUM: 10 mg/dL (ref 8.4–10.4)
CHLORIDE: 98 meq/L (ref 98–109)
CO2: 25 meq/L (ref 22–29)
Creatinine: 9.5 mg/dL (ref 0.6–1.1)
EGFR: 4 mL/min/{1.73_m2} — AB (ref >90)
Glucose: 99 mg/dl (ref 70–140)
Potassium: 3.7 mEq/L (ref 3.5–5.1)
SODIUM: 138 meq/L (ref 136–145)
TOTAL PROTEIN: 7.5 g/dL (ref 6.4–8.3)
Total Bilirubin: 0.49 mg/dL (ref 0.20–1.20)

## 2014-12-20 LAB — KAPPA/LAMBDA LIGHT CHAINS
KAPPA LAMBDA RATIO: 0.01 — AB (ref 0.26–1.65)
Kappa free light chain: 1.95 mg/dL — ABNORMAL HIGH (ref 0.33–1.94)
LAMBDA FREE LGHT CHN: 333 mg/dL — AB (ref 0.57–2.63)

## 2014-12-20 LAB — BETA 2 MICROGLOBULIN, SERUM: BETA 2 MICROGLOBULIN: 41.6 mg/L — AB (ref <=2.51)

## 2014-12-20 MED ORDER — SODIUM CHLORIDE 0.9 % IV SOLN
Freq: Once | INTRAVENOUS | Status: AC
  Administered 2014-12-20: 13:00:00 via INTRAVENOUS
  Filled 2014-12-20: qty 4

## 2014-12-20 MED ORDER — CARFILZOMIB CHEMO INJECTION 60 MG
15.0000 mg/m2 | Freq: Once | INTRAVENOUS | Status: AC
  Administered 2014-12-20: 26 mg via INTRAVENOUS
  Filled 2014-12-20: qty 13

## 2014-12-20 MED ORDER — SODIUM CHLORIDE 0.9 % IV SOLN
INTRAVENOUS | Status: DC
  Administered 2014-12-20: 13:00:00 via INTRAVENOUS

## 2014-12-20 MED ORDER — SODIUM CHLORIDE 0.9 % IV SOLN: Freq: Once | INTRAVENOUS | Status: DC

## 2014-12-20 NOTE — Progress Notes (Signed)
Labs reviewed with Dr. Burr Medico; OK to proceed with treatment per MD.

## 2014-12-20 NOTE — Patient Instructions (Signed)
Carfilzomib injection What is this medicine? CARFILZOMIB (kar FILZ oh mib) is a chemotherapy drug that works by slowing or stopping cancer cell growth. This medicine is used to treat multiple myeloma. This medicine may be used for other purposes; ask your health care provider or pharmacist if you have questions. COMMON BRAND NAME(S): KYPROLIS What should I tell my health care provider before I take this medicine? They need to know if you have any of these conditions: -heart disease -irregular heartbeat -liver disease -lung or breathing disease -an unusual or allergic reaction to carfilzomib, or other medicines, foods, dyes, or preservatives -pregnant or trying to get pregnant -breast-feeding How should I use this medicine? This medicine is for injection or infusion into a vein. It is given by a health care professional in a hospital or clinic setting. Talk to your pediatrician regarding the use of this medicine in children. Special care may be needed. Overdosage: If you think you've taken too much of this medicine contact a poison control center or emergency room at once. Overdosage: If you think you have taken too much of this medicine contact a poison control center or emergency room at once. NOTE: This medicine is only for you. Do not share this medicine with others. What if I miss a dose? It is important not to miss your dose. Call your doctor or health care professional if you are unable to keep an appointment. What may interact with this medicine? Interactions are not expected. Give your health care provider a list of all the medicines, herbs, non-prescription drugs, or dietary supplements you use. Also tell them if you smoke, drink alcohol, or use illegal drugs. Some items may interact with your medicine. This list may not describe all possible interactions. Give your health care provider a list of all the medicines, herbs, non-prescription drugs, or dietary supplements you use. Also  tell them if you smoke, drink alcohol, or use illegal drugs. Some items may interact with your medicine. What should I watch for while using this medicine? Your condition will be monitored carefully while you are receiving this medicine. Report any side effects. Continue your course of treatment even though you feel ill unless your doctor tells you to stop. Call your doctor or health care professional for advice if you get a fever, chills or sore throat, or other symptoms of a cold or flu. Do not treat yourself. Try to avoid being around people who are sick. Do not become pregnant while taking this medicine. Women should inform their doctor if they wish to become pregnant or think they might be pregnant. There is a potential for serious side effects to an unborn child. Talk to your health care professional or pharmacist for more information. Do not breast-feed an infant while taking this medicine. Check with your doctor or health care professional if you get an attack of severe diarrhea, nausea and vomiting, or if you sweat a lot. The loss of too much body fluid can make it dangerous for you to take this medicine. You may get dizzy. Do not drive, use machinery, or do anything that needs mental alertness until you know how this medicine affects you. Do not stand or sit up quickly, especially if you are an older patient. This reduces the risk of dizzy or fainting spells. What side effects may I notice from receiving this medicine? Side effects that you should report to your doctor or health care professional as soon as possible: -allergic reactions like skin rash, itching or hives,  swelling of the face, lips, or tongue -breathing problems -chest pain or palpitationschest tightness -cough -dark urine -dizziness -feeling faint or lightheaded -fever or chills -general ill feeling or flu-like symptoms -light-colored stools -palpitations -right upper belly pain -swelling of the legs or ankles -unusual  bleeding or bruising -unusually weak or tired -yellowing of the eyes or skin Side effects that usually do not require medical attention (Report these to your doctor or health care professional if they continue or are bothersome.): -diarrhea -headache -nausea, vomiting -tiredness This list may not describe all possible side effects. Call your doctor for medical advice about side effects. You may report side effects to FDA at 1-800-FDA-1088. Where should I keep my medicine? This drug is given in a hospital or clinic and will not be stored at home. NOTE: This sheet is a summary. It may not cover all possible information. If you have questions about this medicine, talk to your doctor, pharmacist, or health care provider.  2015, Elsevier/Gold Standard. (2012-03-07 17:02:29)

## 2014-12-21 ENCOUNTER — Telehealth: Payer: Self-pay | Admitting: Hematology and Oncology

## 2014-12-21 ENCOUNTER — Other Ambulatory Visit: Payer: Self-pay | Admitting: Hematology and Oncology

## 2014-12-21 NOTE — Telephone Encounter (Signed)
added appt per MD....emailed Laurin Mullis to contact pt transportation

## 2014-12-22 ENCOUNTER — Encounter: Payer: Self-pay | Admitting: *Deleted

## 2014-12-22 ENCOUNTER — Ambulatory Visit (HOSPITAL_BASED_OUTPATIENT_CLINIC_OR_DEPARTMENT_OTHER): Payer: Medicare Other

## 2014-12-22 DIAGNOSIS — Z5112 Encounter for antineoplastic immunotherapy: Secondary | ICD-10-CM

## 2014-12-22 DIAGNOSIS — C9000 Multiple myeloma not having achieved remission: Secondary | ICD-10-CM

## 2014-12-22 LAB — SPEP & IFE WITH QIG
ALBUMIN ELP: 3.9 g/dL (ref 3.8–4.8)
ALPHA-2-GLOBULIN: 0.5 g/dL (ref 0.5–0.9)
Abnormal Protein Band1: 1.9 g/dL
Alpha-1-Globulin: 0.3 g/dL (ref 0.2–0.3)
BETA 2: 0.2 g/dL (ref 0.2–0.5)
Beta Globulin: 0.3 g/dL — ABNORMAL LOW (ref 0.4–0.6)
GAMMA GLOBULIN: 2.2 g/dL — AB (ref 0.8–1.7)
IgG (Immunoglobin G), Serum: 2360 mg/dL — ABNORMAL HIGH (ref 690–1700)
IgM, Serum: <5 mg/dL — ABNORMAL LOW (ref 52–322)
Total Protein, Serum Electrophoresis: 7.3 g/dL (ref 6.1–8.1)

## 2014-12-22 MED ORDER — SODIUM CHLORIDE 0.9 % IV SOLN: Freq: Once | INTRAVENOUS | Status: DC

## 2014-12-22 MED ORDER — SODIUM CHLORIDE 0.9 % IV SOLN
Freq: Once | INTRAVENOUS | Status: AC
  Administered 2014-12-22: 13:00:00 via INTRAVENOUS

## 2014-12-22 MED ORDER — SODIUM CHLORIDE 0.9 % IV SOLN
Freq: Once | INTRAVENOUS | Status: AC
  Administered 2014-12-22: 13:00:00 via INTRAVENOUS
  Filled 2014-12-22: qty 4

## 2014-12-22 MED ORDER — CARFILZOMIB CHEMO INJECTION 60 MG
15.0000 mg/m2 | Freq: Once | INTRAVENOUS | Status: AC
  Administered 2014-12-22: 26 mg via INTRAVENOUS
  Filled 2014-12-22: qty 13

## 2014-12-22 NOTE — Progress Notes (Signed)
[place] Work  Clinical Social Work requested the following [facility] rides for patient and discussed with patient in infusion room.  CSW continues to advocate on behalf of patient regarding transportation assistance.   S 01/03/2015 Mon 11:00 A 15 LAB MO [1477] [facility] LAB 2 [id number] [facility] [id number]      S 01/03/2015 Mon 12:15 P 120 INFUSION 2 HR [1452] [facility] A1 [id number] [facility] [id number]      S 01/05/2015 Wed 11:30 A 120 INFUSION 2 HR [1452] [facility] H30 [id number] [facility] [id number]      S 01/07/2015 Fri 11:30 A 15 INJECTION [1081] [facility] INJ NURSE [id number] [facility] [id number]      S 01/17/2015 Mon 11:00 A 15 LAB MO [1477] [facility] LAB 6 [id number] [facility] [id number]      S 01/17/2015 Mon 11:45 A 120 INFUSION 2 HR [1452] [facility] E15 [id number] [facility] [id number]      S 01/19/2015 Wed 11:00 A 120 INFUSION 2 HR [1452] [facility] G24 [id number] [facility] [id number]      S 01/24/2015 Mon 11:00 A 15 LAB MO [1477] [facility] LAB 6 [id number] [facility] [id number]      S 01/24/2015 Mon 11:45 A 120 INFUSION 2 HR [1452] [facility] C8 [id number] [facility] [id number]      S 01/26/2015 Wed 11:00 A 120 INFUSION 2 HR [1452] [facility] E15 [id number] [facility] [id number]      S 01/31/2015 Mon 11:00 A 15 LAB MO [1477] [facility] LAB 1 [id number] [facility] [id number]      S 01/31/2015 Mon 11:45 A 120 INFUSION 2 HR [1452] [facility] A2 [id number] [facility] [id number]      S 02/02/2015 Wed 11:00 A 120 INFUSION 2 HR [1452] [facility] C10 [id number] [facility] [id number]      Polo Riley, MSW, LCSW, OSW-C Clinical Social Worker [place] [phone]

## 2014-12-22 NOTE — Patient Instructions (Signed)
[facility] Discharge Instructions for Patients Receiving Chemotherapy  Today you received the following chemotherapy agents: Kyprolis.  To help prevent nausea and vomiting after your treatment, we encourage you to take your nausea medication: Compazine 10 mg every 6 hours as needed. Zofran 8 mg every 12 hours as needed.   If you develop nausea and vomiting that is not controlled by your nausea medication, call the clinic.   BELOW ARE SYMPTOMS THAT SHOULD BE REPORTED IMMEDIATELY:  *FEVER GREATER THAN 100.5 F  *CHILLS WITH OR WITHOUT FEVER  NAUSEA AND VOMITING THAT IS NOT CONTROLLED WITH YOUR NAUSEA MEDICATION  *UNUSUAL SHORTNESS OF BREATH  *UNUSUAL BRUISING OR BLEEDING  TENDERNESS IN MOUTH AND THROAT WITH OR WITHOUT PRESENCE OF ULCERS  *URINARY PROBLEMS  *BOWEL PROBLEMS  UNUSUAL RASH Items with * indicate a potential emergency and should be followed up as soon as possible.  Feel free to call the clinic you have any questions or concerns. The clinic phone number is [phone].  Please show the [facility] at check-in to the Emergency Department and triage nurse.

## 2014-12-24 ENCOUNTER — Ambulatory Visit (HOSPITAL_BASED_OUTPATIENT_CLINIC_OR_DEPARTMENT_OTHER): Payer: Medicare Other

## 2014-12-24 DIAGNOSIS — Z5189 Encounter for other specified aftercare: Secondary | ICD-10-CM

## 2014-12-24 DIAGNOSIS — C9000 Multiple myeloma not having achieved remission: Secondary | ICD-10-CM | POA: Diagnosis present

## 2014-12-24 DIAGNOSIS — D61818 Other pancytopenia: Secondary | ICD-10-CM

## 2014-12-24 MED ORDER — TBO-FILGRASTIM 480 MCG/0.8ML ~~LOC~~ SOSY
480.0000 ug | PREFILLED_SYRINGE | Freq: Once | SUBCUTANEOUS | Status: AC
  Administered 2014-12-24: 480 ug via SUBCUTANEOUS
  Filled 2014-12-24: qty 0.8

## 2014-12-27 ENCOUNTER — Other Ambulatory Visit (HOSPITAL_BASED_OUTPATIENT_CLINIC_OR_DEPARTMENT_OTHER): Payer: Medicare Other

## 2014-12-27 ENCOUNTER — Ambulatory Visit (HOSPITAL_BASED_OUTPATIENT_CLINIC_OR_DEPARTMENT_OTHER): Payer: Medicare Other | Admitting: Hematology and Oncology

## 2014-12-27 ENCOUNTER — Ambulatory Visit: Payer: Medicare Other

## 2014-12-27 ENCOUNTER — Other Ambulatory Visit: Payer: Self-pay | Admitting: *Deleted

## 2014-12-27 ENCOUNTER — Other Ambulatory Visit: Payer: Self-pay | Admitting: Hematology and Oncology

## 2014-12-27 VITALS — BP 140/84 | HR 50 | Temp 97.8°F | Resp 20 | Ht 68.0 in | Wt 143.5 lb

## 2014-12-27 DIAGNOSIS — C9000 Multiple myeloma not having achieved remission: Secondary | ICD-10-CM

## 2014-12-27 DIAGNOSIS — Z992 Dependence on renal dialysis: Secondary | ICD-10-CM

## 2014-12-27 DIAGNOSIS — D630 Anemia in neoplastic disease: Secondary | ICD-10-CM | POA: Diagnosis not present

## 2014-12-27 DIAGNOSIS — R600 Localized edema: Secondary | ICD-10-CM

## 2014-12-27 DIAGNOSIS — R609 Edema, unspecified: Secondary | ICD-10-CM

## 2014-12-27 DIAGNOSIS — N186 End stage renal disease: Secondary | ICD-10-CM

## 2014-12-27 LAB — COMPREHENSIVE METABOLIC PANEL (CC13)
ALT: 18 U/L (ref 0–55)
AST: 12 U/L (ref 5–34)
Albumin: 3 g/dL — ABNORMAL LOW (ref 3.5–5.0)
Alkaline Phosphatase: 45 U/L (ref 40–150)
Anion Gap: 18 mEq/L — ABNORMAL HIGH (ref 3–11)
BILIRUBIN TOTAL: 0.5 mg/dL (ref 0.20–1.20)
BUN: 52.1 mg/dL — ABNORMAL HIGH (ref 7.0–26.0)
CHLORIDE: 97 meq/L — AB (ref 98–109)
CO2: 22 meq/L (ref 22–29)
Calcium: 9 mg/dL (ref 8.4–10.4)
Creatinine: 11 mg/dL (ref 0.6–1.1)
EGFR: 3 mL/min/{1.73_m2} — AB (ref >90)
GLUCOSE: 94 mg/dL (ref 70–140)
Potassium: 4.1 mEq/L (ref 3.5–5.1)
SODIUM: 136 meq/L (ref 136–145)
Total Protein: 7.1 g/dL (ref 6.4–8.3)

## 2014-12-27 LAB — CBC WITH DIFFERENTIAL/PLATELET
BASO%: 0.2 % (ref 0.0–2.0)
BASOS ABS: 0 10*3/uL (ref 0.0–0.1)
EOS ABS: 0.5 10*3/uL (ref 0.0–0.5)
EOS%: 7.1 % — ABNORMAL HIGH (ref 0.0–7.0)
HEMATOCRIT: 32.3 % — AB (ref 34.8–46.6)
HGB: 10.4 g/dL — ABNORMAL LOW (ref 11.6–15.9)
LYMPH%: 15.4 % (ref 14.0–49.7)
MCH: 33.2 pg (ref 25.1–34.0)
MCHC: 32.2 g/dL (ref 31.5–36.0)
MCV: 103.2 fL — ABNORMAL HIGH (ref 79.5–101.0)
MONO#: 0.8 10*3/uL (ref 0.1–0.9)
MONO%: 12.1 % (ref 0.0–14.0)
NEUT%: 65.2 % (ref 38.4–76.8)
NEUTROS ABS: 4.1 10*3/uL (ref 1.5–6.5)
Platelets: 108 10*3/uL — ABNORMAL LOW (ref 145–400)
RBC: 3.13 10*6/uL — ABNORMAL LOW (ref 3.70–5.45)
RDW: 18.1 % — ABNORMAL HIGH (ref 11.2–14.5)
WBC: 6.3 10*3/uL (ref 3.9–10.3)
lymph#: 1 10*3/uL (ref 0.9–3.3)

## 2014-12-27 MED ORDER — FUROSEMIDE 20 MG PO TABS: 20.0000 mg | ORAL_TABLET | Freq: Every day | ORAL | Status: DC

## 2014-12-27 NOTE — Progress Notes (Signed)
[facility] OFFICE PROGRESS NOTE  Patient Care Team: Riccardo Dubin, MD as PCP - General (Internal Medicine)  SUMMARY OF ONCOLOGIC HISTORY:   Multiple myeloma   03/30/2008 Initial Diagnosis Multiple myeloma   03/30/2008 - 05/01/2012 Chemotherapy Revlimid/Dex then Velcade (started on 07/13/2011 ending on 09/14/2011) without response.  Managed by Dr. Chauncey Fischer of [place], [place] [address]   07/24/2010 -  Chemotherapy Received zometa  intermittently over one year.  Creatinine was 1.0 on 07/24/2010. 1.7 on 08/10/2011.    05/01/2012 - 09/01/2012 Chemotherapy Starte Pomalidomide s/p 4 cycles. Referred to [facility] for consideration of ASCT.     10/08/2012 Bone Marrow Biopsy Normocellular marrow with residual/recurrently plasma cell myeloma, 60-70 % of overall marrow celluarity.  Flow: c/w plasma cell neoplasm, plasma cell infiltrate comprises 60-70 % of overall marrow cellularity; FISH: + for 1 q21/CKS1B gain; RB1 (13q14)   10/08/2012 Bone Marrow Biopsy Continued... Cytogenetics showed normal female karyotype.    10/29/2012 Tumor Marker High IgG (6019), high lamda free light chain (409.32) with low IgA, IgM and kappa free light chain.    04/21/2013 Imaging Skeletal survey.  No suspcious lytic or blastic lesions visualized.  Severe multilevel degenerative disc disease in the cervical spine.    04/29/2013 Imaging PeT. No discrete suspicious focus of FDG uptake identified.    05/20/2013 - 06/09/2013 Chemotherapy Started carfilxomib 39 mg on 08/25, 08/26, day 8 (06/02/13), day 9 on 06/03/2013 and on 06/09/2013.     12/03/2013 Treatment Plan Change Last office visit with Dr. Glenna Durand.  She decline further chemotherapy.   [name]    03/11/2014 - 03/17/2014 Hospital Admission Admitted for low hemoglobin. Dr. Burney Gauze consulted.  SPEP,  UPEP and Bone marrow biopsy obtained.    IgG 11,300. M-spike 7.52, kappa lamda ratio 0  with lambda free light chains totaling 1260.    03/12/2014 Imaging Skeletal survey. 1. Small lytic lesions within the skull and right scapula. 2. Degenerative changes in the spine and knees, right greater than left. 3. Cardiomegaly without pulmonary edema.   03/17/2014 Bone Marrow Biopsy Hypercellular bone marrow tih extensive involvment by plasma cell neoplasm (Plasma cells 85%). FISH: Presence of 2 copies of the IGH/FGFR3, ATM, CCND1/IGH, and p53 probes.  Loss of 13q34 probes in 40% of cells. gain of chromose 12 and either loss of chorm   03/25/2014 -  Chemotherapy Planning Kyprolis 15 mg/m2 plus dexamethasone weekly plus revlimid ($RemoveBefore'5mg'iAjBwdbYIYzrP$ ) daily on 06/29.   06/14/2014 -  Chemotherapy Patient's last dose of Kyprolis was on 05/26/14. Since then chemo held due to neutropenia despite GCSF support. Revlimid also on hold as of 06/14/14   06/28/2014 -  Chemotherapy New cycle of Kyprolis starts today.   07/07/2014 Tumor Marker IgG 3960    07/23/2014 Tumor Marker IgG 3620   10/04/2014 Tumor Marker IgG 2230 with M spike down to 0.06   12/20/2014 Tumor Marker  repeat M spike show 1.9 g IgG kappa. IgG level 2360 and lambda light chains at 333     INTERVAL HISTORY: Please see below for problem oriented charting.  she returns to review test results.  the patient is informed about disease progression. She denies recent infection. New bone pain or bone fracture.  REVIEW OF SYSTEMS:   Constitutional: Denies fevers, chills or abnormal weight loss Eyes: Denies blurriness of vision Ears, nose, mouth, throat, and face: Denies mucositis or sore throat Respiratory: Denies cough, dyspnea  or wheezes Cardiovascular: Denies palpitation, chest discomfort or lower extremity swelling Gastrointestinal:  Denies nausea, heartburn or change in bowel habits Skin: Denies abnormal skin rashes Lymphatics: Denies new lymphadenopathy or easy bruising Neurological:Denies numbness, tingling or new weaknesses Behavioral/Psych: Mood is  stable, no new changes  All other systems were reviewed with the patient and are negative.  I have reviewed the past medical history, past surgical history, social history and family history with the patient and they are unchanged from previous note.  ALLERGIES:  has No Known Allergies.  MEDICATIONS:  Current Outpatient Prescriptions  Medication Sig Dispense Refill  . dexamethasone (DECADRON) 4 MG tablet Take 3 tablets (12 mg) on days 1,8,15, and 22 of chemotherapy. (Patient taking differently: Take 3 tablets (12 mg) on days 1,8,15, and 22 of chemotherapy.  Taking 2 tablets) 24 tablet 2  . furosemide (LASIX) 20 MG tablet Take 1 tablet (20 mg total) by mouth daily. PRN for leg edema 60 tablet 2  . hydrALAZINE (APRESOLINE) 100 MG tablet Take 100 mg by mouth every 8 (eight) hours.    Marland Kitchen HYDROcodone-homatropine (HYCODAN) 5-1.5 MG/5ML syrup Take 5 mLs by mouth every 6 (six) hours as needed for cough. 120 mL 0  . isosorbide mononitrate (IMDUR) 60 MG 24 hr tablet Take 60 mg by mouth daily.    Marland Kitchen lenalidomide (REVLIMID) 2.5 MG capsule Take 1 capsule (2.5 mg total) by mouth daily. 28 capsule 0  . Nutritional Supplements (FEEDING SUPPLEMENT, NEPRO CARB STEADY,) LIQD Take 237 mLs by mouth 2 (two) times daily between meals. 237 mL 60  . ondansetron (ZOFRAN) 8 MG tablet Take 1 tablet (8 mg total) by mouth 2 (two) times daily. Start the day after chemo for 2 days. Then as needed for nausea or vomiting. 30 tablet 1  . prochlorperazine (COMPAZINE) 10 MG tablet Take 1 tablet (10 mg total) by mouth every 6 (six) hours as needed (Nausea or vomiting). 30 tablet 1  . vitamin C (ASCORBIC ACID) 500 MG tablet Take 500 mg by mouth daily.     No current facility-administered medications for this visit.    PHYSICAL EXAMINATION: ECOG PERFORMANCE STATUS: 0 - Asymptomatic  Filed Vitals:   12/27/14 0957  BP: 140/84  Pulse: 50  Temp: 97.8 F (36.6 C)  Resp: 20   Filed Weights   12/27/14 0957  Weight: 143 lb 8  oz (65.091 kg)    GENERAL:alert, no distress and comfortable SKIN: skin color, texture, turgor are normal, no rashes or significant lesions EYES: normal, Conjunctiva are pink and non-injected, sclera clear OROPHARYNX:no exudate, no erythema and lips, buccal mucosa, and tongue normal  NECK: supple, thyroid normal size, non-tender, without nodularity LYMPH:  no palpable lymphadenopathy in the cervical, axillary or inguinal LUNGS: clear to auscultation and percussion with normal breathing effort HEART: regular rate & rhythm and no murmurs with mild bilateral lower extremity edema ABDOMEN:abdomen soft, non-tender and normal bowel sounds Musculoskeletal:no cyanosis of digits and no clubbing  NEURO: alert & oriented x 3 with fluent speech, no focal motor/sensory deficits  LABORATORY DATA:  I have reviewed the data as listed    Component Value Date/Time   NA 136 12/27/2014 0943   NA 131* 10/19/2014 1245   K 4.1 12/27/2014 0943   K 3.9 10/19/2014 1245   CL 96 10/19/2014 1245   CO2 22 12/27/2014 0943   CO2 24 10/19/2014 1245   GLUCOSE 94 12/27/2014 0943   GLUCOSE 89 10/19/2014 1245   BUN 52.1* 12/27/2014 7989  BUN 46* 10/19/2014 1245   CREATININE 11.0* 12/27/2014 0943   CREATININE 11.03* 10/19/2014 1245   CALCIUM 9.0 12/27/2014 0943   CALCIUM 8.3* 10/19/2014 1245   PROT 7.1 12/27/2014 0943   PROT 10.3* 05/10/2014 1439   ALBUMIN 3.0* 12/27/2014 0943   ALBUMIN 2.5* 10/19/2014 0441   AST 12 12/27/2014 0943   AST 17 05/10/2014 1439   ALT 18 12/27/2014 0943   ALT 7 05/10/2014 1439   ALKPHOS 45 12/27/2014 0943   ALKPHOS 50 05/10/2014 1439   BILITOT 0.50 12/27/2014 0943   BILITOT 0.4 05/10/2014 1439   GFRNONAA 3* 10/19/2014 1245   GFRAA 3* 10/19/2014 1245    No results found for: SPEP, UPEP  Lab Results  Component Value Date   WBC 6.3 12/27/2014   NEUTROABS 4.1 12/27/2014   HGB 10.4* 12/27/2014   HCT 32.3* 12/27/2014   MCV 103.2* 12/27/2014   PLT 108* 12/27/2014       Chemistry      Component Value Date/Time   NA 136 12/27/2014 0943   NA 131* 10/19/2014 1245   K 4.1 12/27/2014 0943   K 3.9 10/19/2014 1245   CL 96 10/19/2014 1245   CO2 22 12/27/2014 0943   CO2 24 10/19/2014 1245   BUN 52.1* 12/27/2014 0943   BUN 46* 10/19/2014 1245   CREATININE 11.0* 12/27/2014 0943   CREATININE 11.03* 10/19/2014 1245      Component Value Date/Time   CALCIUM 9.0 12/27/2014 0943   CALCIUM 8.3* 10/19/2014 1245   ALKPHOS 45 12/27/2014 0943   ALKPHOS 50 05/10/2014 1439   AST 12 12/27/2014 0943   AST 17 05/10/2014 1439   ALT 18 12/27/2014 0943   ALT 7 05/10/2014 1439   BILITOT 0.50 12/27/2014 0943   BILITOT 0.4 05/10/2014 1439      ASSESSMENT & PLAN:  Multiple myeloma Unfortunately, she had progressed on recent treatment with current kyprolis and Revlimid. I tried my very best to explain to the patient the reason I am switching her treatment. I think there might be a combination of mild dementia plus her background education. She has a difficult time understanding the reason behind switching her treatment. I would not recommend transplant for the patient's as She had recent recurrent infection and has end-stage renal Disease. We discussed second opinion. I recommend she gets family member involved in her care because I felt that her situation is quite terminal. I do not have anything else to offer her beyond Daratumumab. The decision was made based on publication at the [facility].  Targeting CD38 with Daratumumab Monotherapy in Multiple Myeloma Henk M. Nikki Dom, M.D., Ph.D., Lisabeth Pick, M.D., Edyth Gunnels. Serafina Mitchell, M.D., Carolanne Grumbling, M.D., Ph.D., Lelon Frohlich, M.D., Ph.D., Towanda Malkin, M.D., Ph.D., Monique C. Linus Galas, M.D., Ph.D., Harden Mo, M.D., Ph.D., Rudolpho Sevin, M.D., Lucienne Capers, M.D., Stefano Gaul W.C.J. Mady Haagensen, M.D., Ph.D., Lynetta Mare, M.D., Ph.D., Ella Jubilee, M.D., Ph.D., Darol Destine. Rozann Lesches, Ph.D., Mayer Masker, Ph.D., Levy Pupa, Ph.D.,  Pearletha Forge, M.[place]., Candie Mile, M.D., Godfrey Pick, M.D., Hassan Rowan, M.D., Ph.D., and Loyal Gambler. Marvel Plan, M.D. Alison Stalling J Med [phone] 24, 2015  The chemotherapy consists of daratumumab, a humanized monoclonal antibody against CD 38.  According to the publication, there were 2 complete response is noted in the group of 32 patients treated.. Infusion reactions are common side effects.  Some of the short term side-effects included, though not limited to, risk of fatigue, weight loss, tumor lysis syndrome, risk of allergic reactions, pancytopenia,  life-threatening infections, need for transfusions of blood products, admission to hospital for various reasons, and risks of death.   The patient is aware that the response rates discussed earlier is not guaranteed.   After a long discussion, patient made an informed decision to proceed with the prescribed plan of care.    Anemia in neoplastic disease She is not symptomatic and does not require transfusion or erythropoietin stimulating agent right now.   Bilateral leg edema  This is due to end-stage renal disease. She requests a refill of Lasix.   ESRD (end stage renal disease) She will continue hemodialysis 3 times a week through the fistula on Tuesday, Thursday and Saturday.      No orders of the defined types were placed in this encounter.   All questions were answered. The patient knows to call the clinic with any problems, questions or concerns. No barriers to learning was detected. I spent 30 minutes counseling the patient face to face. The total time spent in the appointment was 40 minutes and more than 50% was on counseling and review of test results     [facility], Dae Highley, MD 12/27/2014 11:20 AM

## 2014-12-27 NOTE — Assessment & Plan Note (Signed)
Unfortunately, she had progressed on recent treatment with current kyprolis and Revlimid. I tried my very best to explain to the patient the reason I am switching her treatment. I think there might be a combination of mild dementia plus her background education. She has a difficult time understanding the reason behind switching her treatment. I would not recommend transplant for the patient's as  She had recent recurrent infection and has end-stage renal  Disease. We discussed second opinion. I recommend she gets family member involved in her care because I felt that her situation is quite terminal. I do not have anything else to offer her beyond Daratumumab. The decision was made based on publication at the [facility].  Targeting CD38 with Daratumumab Monotherapy in Multiple Myeloma Henk M. Nikki Dom, M.D., Ph.D., Lisabeth Pick, M.D., Edyth Gunnels. Serafina Mitchell, M.D., Carolanne Grumbling, M.D., Ph.D., Lelon Frohlich, M.D., Ph.D., Towanda Malkin, M.D., Ph.D., Monique C. Linus Galas, M.D., Ph.D., Harden Mo, M.D., Ph.D., Rudolpho Sevin, M.D., Lucienne Capers, M.D., Stefano Gaul W.C.J. Mady Haagensen, M.D., Ph.D., Lynetta Mare, M.D., Ph.D., Ella Jubilee, M.D., Ph.D., Darol Destine. Rozann Lesches, Ph.D., Mayer Masker, Ph.D., Levy Pupa, Ph.D., Pearletha Forge, M.[place]., Candie Mile, M.D., Godfrey Pick, M.D., Hassan Rowan, M.D., Ph.D., and Loyal Gambler. Marvel Plan, M.D. Alison Stalling J Med [phone] 24, 2015  The chemotherapy consists of daratumumab, a humanized monoclonal antibody against CD 38.  According to the publication, there were 2 complete response is noted in the group of 32 patients treated.. Infusion reactions are common side effects.  Some of the short term side-effects included, though not limited to, risk of fatigue, weight loss, tumor lysis syndrome, risk of allergic reactions, pancytopenia, life-threatening infections, need for transfusions of blood products, admission to hospital for various reasons, and risks of death.   The  patient is aware that the response rates discussed earlier is not guaranteed.    After a long discussion, patient made an informed decision to proceed with the prescribed plan of care.

## 2014-12-27 NOTE — Assessment & Plan Note (Signed)
This is due to end-stage renal disease. She requests a refill of Lasix.

## 2014-12-27 NOTE — Telephone Encounter (Signed)
Revlimid d/c'd by [name] d/t disease progression.  Notified [facility].

## 2014-12-27 NOTE — Assessment & Plan Note (Signed)
She will continue hemodialysis 3 times a week through the fistula on Tuesday, Thursday and Saturday.

## 2014-12-27 NOTE — Assessment & Plan Note (Signed)
She is not symptomatic and does not require transfusion or erythropoietin stimulating agent right now.

## 2014-12-27 NOTE — Assessment & Plan Note (Signed)
Unfortunately, she had progressed on recent treatment with current kyprolis and Revlimid. I tried my very best to explain to the patient the reason I am switching her treatment. I think there might be a combination of mild dementia plus her background education. She has a difficult time understanding the reason behind switching her treatment. I would not recommend transplant for the patient's as She had recent recurrent infection and has end-stage renal Disease. We discussed second opinion. I recommend she gets family member involved in her care because I felt that her situation is quite terminal. I do not have anything else to offer her beyond Daratumumab. The decision was made based on publication at the [facility].  Targeting CD38 with Daratumumab Monotherapy in Multiple Myeloma Henk M. Nikki Dom, M.D., Ph.D., Lisabeth Pick, M.D., Edyth Gunnels. Serafina Mitchell, M.D., Carolanne Grumbling, M.D., Ph.D., Lelon Frohlich, M.D., Ph.D., Towanda Malkin, M.D., Ph.D., Monique C. Linus Galas, M.D., Ph.D., Harden Mo, M.D., Ph.D., Rudolpho Sevin, M.D., Lucienne Capers, M.D., Stefano Gaul W.C.J. Mady Haagensen, M.D., Ph.D., Lynetta Mare, M.D., Ph.D., Ella Jubilee, M.D., Ph.D., Darol Destine. Rozann Lesches, Ph.D., Mayer Masker, Ph.D., Levy Pupa, Ph.D., Pearletha Forge, M.[place]., Candie Mile, M.D., Godfrey Pick, M.D., Hassan Rowan, M.D., Ph.D., and Loyal Gambler. Marvel Plan, M.D. Alison Stalling J Med [phone] 24, 2015  The chemotherapy consists of daratumumab, a humanized monoclonal antibody against CD 38.  According to the publication, there were 2 complete response is noted in the group of 32 patients treated.. Infusion reactions are common side effects.  Some of the short term side-effects included, though not limited to, risk of fatigue, weight loss, tumor lysis syndrome, risk of allergic reactions, pancytopenia, life-threatening infections, need for transfusions of blood products, admission to hospital for various reasons, and risks of death.   The  patient is aware that the response rates discussed earlier is not guaranteed.   After a long discussion, patient made an informed decision to proceed with the prescribed plan of care.

## 2014-12-27 NOTE — Assessment & Plan Note (Signed)
She has bilateral lower extremity edema and requested refill on Lasix.  suspect this is due to end-stage renal disease.

## 2014-12-28 ENCOUNTER — Telehealth: Payer: Self-pay | Admitting: Hematology and Oncology

## 2014-12-28 ENCOUNTER — Telehealth: Payer: Self-pay | Admitting: *Deleted

## 2014-12-28 ENCOUNTER — Other Ambulatory Visit: Payer: Self-pay | Admitting: Hematology and Oncology

## 2014-12-28 NOTE — Telephone Encounter (Signed)
mailed appt sched and letter

## 2014-12-28 NOTE — Telephone Encounter (Signed)
Per staff messages and POF I have scheduled appts. Advised scheduler of appts. JMW  

## 2014-12-28 NOTE — Telephone Encounter (Signed)
pt phone does not allow messages...Marland KitchenMarland Kitchen

## 2014-12-29 ENCOUNTER — Ambulatory Visit: Payer: Self-pay

## 2014-12-30 ENCOUNTER — Encounter: Payer: Self-pay | Admitting: *Deleted

## 2014-12-30 NOTE — Progress Notes (Signed)
[place] Work  Clinical Social Work was referred by healthcare team to change patients transportation requests through [employer] due to new treatment regimen.  Clinical Social Worker contacted [facility] to cancel previous appointments and request transportation for the appointments below.  If [facility] is able to provide transportation, CSW requested they contact patient and CSW with this information.   S 01/07/2015 Fri  8:30 A 15 LAB MO [1477] [facility] 1 [id number] [facility] [id number]      S 01/07/2015 Fri  9:00 A 480 INFUSION 8 HR [1464] [facility] E17 [id number] [facility] [id number] (6) 1st dara     S 01/14/2015 Fri  8:30 A 15 LAB MO [1477] [facility] 4 [id number] [facility] [id number]      S 01/14/2015 Fri  9:00 A 480 INFUSION 8 HR [1464] [facility] E14 [[id number]] [facility] [id number] (6) dara     S 01/21/2015 Fri  8:30 A 15 LAB MO [1477] [facility] 6 [id number] [facility] [id number]      S 01/21/2015 Fri  9:00 A 480 INFUSION 8 HR [1464] [facility] J32 DNS [id number] [facility] [id number] (6) dara     S 01/28/2015 Fri  8:30 A 15 LAB MO [1477] [facility] LAB 6 [id number] [facility] [id number]      S 01/28/2015 Fri  9:00 A 480 INFUSION 8 HR [1464] [facility] D11 [id number] [facility] [id number] (6) dara     S 01/28/2015 Fri 11:30 A 30 EST PT 30 [965] [place], NI [id number] [facility] [id number] d.t. per pof     Polo Riley, MSW, LCSW, OSW-C Clinical Social Worker [place] [phone]

## 2014-12-31 ENCOUNTER — Ambulatory Visit: Payer: Self-pay

## 2015-01-03 ENCOUNTER — Ambulatory Visit: Payer: Self-pay

## 2015-01-03 ENCOUNTER — Other Ambulatory Visit: Payer: Self-pay

## 2015-01-05 ENCOUNTER — Ambulatory Visit: Payer: Self-pay

## 2015-01-07 ENCOUNTER — Other Ambulatory Visit (HOSPITAL_BASED_OUTPATIENT_CLINIC_OR_DEPARTMENT_OTHER): Payer: Medicare Other

## 2015-01-07 ENCOUNTER — Ambulatory Visit: Payer: Self-pay

## 2015-01-07 ENCOUNTER — Ambulatory Visit (HOSPITAL_BASED_OUTPATIENT_CLINIC_OR_DEPARTMENT_OTHER): Payer: Medicare Other

## 2015-01-07 DIAGNOSIS — C9000 Multiple myeloma not having achieved remission: Secondary | ICD-10-CM

## 2015-01-07 DIAGNOSIS — Z5112 Encounter for antineoplastic immunotherapy: Secondary | ICD-10-CM

## 2015-01-07 DIAGNOSIS — D630 Anemia in neoplastic disease: Secondary | ICD-10-CM | POA: Diagnosis not present

## 2015-01-07 DIAGNOSIS — N186 End stage renal disease: Secondary | ICD-10-CM | POA: Diagnosis not present

## 2015-01-07 LAB — CBC WITH DIFFERENTIAL/PLATELET
BASO%: 0.9 % (ref 0.0–2.0)
BASOS ABS: 0 10*3/uL (ref 0.0–0.1)
EOS%: 3.7 % (ref 0.0–7.0)
Eosinophils Absolute: 0.1 10*3/uL (ref 0.0–0.5)
HEMATOCRIT: 36.8 % (ref 34.8–46.6)
HEMOGLOBIN: 11.3 g/dL — AB (ref 11.6–15.9)
LYMPH#: 0.9 10*3/uL (ref 0.9–3.3)
LYMPH%: 28.8 % (ref 14.0–49.7)
MCH: 32 pg (ref 25.1–34.0)
MCHC: 30.7 g/dL — ABNORMAL LOW (ref 31.5–36.0)
MCV: 104.2 fL — ABNORMAL HIGH (ref 79.5–101.0)
MONO#: 0.3 10*3/uL (ref 0.1–0.9)
MONO%: 8 % (ref 0.0–14.0)
NEUT%: 58.6 % (ref 38.4–76.8)
NEUTROS ABS: 1.9 10*3/uL (ref 1.5–6.5)
Platelets: 146 10*3/uL (ref 145–400)
RBC: 3.53 10*6/uL — ABNORMAL LOW (ref 3.70–5.45)
RDW: 17.2 % — AB (ref 11.2–14.5)
WBC: 3.3 10*3/uL — ABNORMAL LOW (ref 3.9–10.3)
nRBC: 0 % (ref 0–0)

## 2015-01-07 LAB — COMPREHENSIVE METABOLIC PANEL (CC13)
ALT: 12 U/L (ref 0–55)
ANION GAP: 15 meq/L — AB (ref 3–11)
AST: 12 U/L (ref 5–34)
Albumin: 3.1 g/dL — ABNORMAL LOW (ref 3.5–5.0)
Alkaline Phosphatase: 56 U/L (ref 40–150)
BUN: 26.6 mg/dL — ABNORMAL HIGH (ref 7.0–26.0)
CALCIUM: 9.8 mg/dL (ref 8.4–10.4)
CHLORIDE: 96 meq/L — AB (ref 98–109)
CO2: 26 meq/L (ref 22–29)
CREATININE: 5.7 mg/dL — AB (ref 0.6–1.1)
EGFR: 8 mL/min/{1.73_m2} — AB (ref >90)
Glucose: 76 mg/dl (ref 70–140)
Potassium: 3.8 mEq/L (ref 3.5–5.1)
Sodium: 138 mEq/L (ref 136–145)
Total Bilirubin: 0.5 mg/dL (ref 0.20–1.20)
Total Protein: 7.8 g/dL (ref 6.4–8.3)

## 2015-01-07 MED ORDER — DIPHENHYDRAMINE HCL 25 MG PO CAPS
50.0000 mg | ORAL_CAPSULE | Freq: Once | ORAL | Status: AC
  Administered 2015-01-07: 50 mg via ORAL

## 2015-01-07 MED ORDER — SODIUM CHLORIDE 0.9 % IV SOLN
Freq: Once | INTRAVENOUS | Status: AC
  Administered 2015-01-07: 10:00:00 via INTRAVENOUS

## 2015-01-07 MED ORDER — METHYLPREDNISOLONE SODIUM SUCC 125 MG IJ SOLR
125.0000 mg | Freq: Once | INTRAMUSCULAR | Status: AC
  Administered 2015-01-07: 125 mg via INTRAVENOUS

## 2015-01-07 MED ORDER — ACETAMINOPHEN 325 MG PO TABS
650.0000 mg | ORAL_TABLET | Freq: Once | ORAL | Status: AC
Start: 2015-01-07 — End: 2015-01-07
  Administered 2015-01-07: 650 mg via ORAL

## 2015-01-07 MED ORDER — SODIUM CHLORIDE 0.9 % IV SOLN
15.5000 mg/kg | Freq: Once | INTRAVENOUS | Status: AC
  Administered 2015-01-07: 1000 mg via INTRAVENOUS
  Filled 2015-01-07: qty 50

## 2015-01-07 MED ORDER — METHYLPREDNISOLONE SODIUM SUCC 125 MG IJ SOLR
INTRAMUSCULAR | Status: AC
  Filled 2015-01-07: qty 2

## 2015-01-07 MED ORDER — SODIUM CHLORIDE 0.9 % IV SOLN
Freq: Once | INTRAVENOUS | Status: AC
  Administered 2015-01-07: 10:00:00 via INTRAVENOUS
  Filled 2015-01-07: qty 4

## 2015-01-07 MED ORDER — DIPHENHYDRAMINE HCL 25 MG PO CAPS
ORAL_CAPSULE | ORAL | Status: AC
  Filled 2015-01-07: qty 2

## 2015-01-07 MED ORDER — ACETAMINOPHEN 325 MG PO TABS
ORAL_TABLET | ORAL | Status: AC
  Filled 2015-01-07: qty 2

## 2015-01-07 NOTE — Patient Instructions (Addendum)
[facility] Discharge Instructions for Patients Receiving Chemotherapy  Today you received the following chemotherapy agents: Daratumumab   To help prevent nausea and vomiting after your treatment, we encourage you to take your nausea medication as prescribed.   If you develop nausea and vomiting that is not controlled by your nausea medication, call the clinic.   BELOW ARE SYMPTOMS THAT SHOULD BE REPORTED IMMEDIATELY:  *FEVER GREATER THAN 100.5 F  *CHILLS WITH OR WITHOUT FEVER  NAUSEA AND VOMITING THAT IS NOT CONTROLLED WITH YOUR NAUSEA MEDICATION  *UNUSUAL SHORTNESS OF BREATH  *UNUSUAL BRUISING OR BLEEDING  TENDERNESS IN MOUTH AND THROAT WITH OR WITHOUT PRESENCE OF ULCERS  *URINARY PROBLEMS  *BOWEL PROBLEMS  UNUSUAL RASH Items with * indicate a potential emergency and should be followed up as soon as possible.  Feel free to call the clinic you have any questions or concerns. The clinic phone number is [phone].  Please show the [facility] at check-in to the Emergency Department and triage nurse.

## 2015-01-08 LAB — DIRECT ANTIGLOBULIN RFX ANTI-C3/IGG: DAT, Polyspecific: NEGATIVE

## 2015-01-14 ENCOUNTER — Other Ambulatory Visit: Payer: Self-pay

## 2015-01-14 ENCOUNTER — Ambulatory Visit: Payer: Self-pay

## 2015-01-14 ENCOUNTER — Telehealth: Payer: Self-pay | Admitting: Hematology and Oncology

## 2015-01-14 NOTE — Telephone Encounter (Signed)
s.w pt and she has diareea and vomiting...per Dr. Cletus Gash cx todays tx and just come next friday.

## 2015-01-17 ENCOUNTER — Other Ambulatory Visit: Payer: Self-pay

## 2015-01-17 ENCOUNTER — Ambulatory Visit: Payer: Self-pay

## 2015-01-19 ENCOUNTER — Ambulatory Visit: Payer: Self-pay

## 2015-01-21 ENCOUNTER — Other Ambulatory Visit: Payer: Self-pay

## 2015-01-21 ENCOUNTER — Telehealth: Payer: Self-pay | Admitting: Hematology and Oncology

## 2015-01-21 ENCOUNTER — Encounter (HOSPITAL_COMMUNITY): Payer: Self-pay | Admitting: Emergency Medicine

## 2015-01-21 ENCOUNTER — Emergency Department (HOSPITAL_COMMUNITY): Payer: Medicare Other

## 2015-01-21 ENCOUNTER — Emergency Department (HOSPITAL_COMMUNITY)
Admission: EM | Admit: 2015-01-21 | Discharge: 2015-01-21 | Disposition: A | Discharge status: Home / Self Care | Payer: Medicare Other | Attending: Emergency Medicine | Admitting: Emergency Medicine

## 2015-01-21 ENCOUNTER — Ambulatory Visit: Payer: Self-pay

## 2015-01-21 DIAGNOSIS — Z8739 Personal history of other diseases of the musculoskeletal system and connective tissue: Secondary | ICD-10-CM | POA: Diagnosis not present

## 2015-01-21 DIAGNOSIS — N186 End stage renal disease: Secondary | ICD-10-CM | POA: Insufficient documentation

## 2015-01-21 DIAGNOSIS — Z8579 Personal history of other malignant neoplasms of lymphoid, hematopoietic and related tissues: Secondary | ICD-10-CM | POA: Insufficient documentation

## 2015-01-21 DIAGNOSIS — I120 Hypertensive chronic kidney disease with stage 5 chronic kidney disease or end stage renal disease: Secondary | ICD-10-CM | POA: Insufficient documentation

## 2015-01-21 DIAGNOSIS — Z8719 Personal history of other diseases of the digestive system: Secondary | ICD-10-CM | POA: Insufficient documentation

## 2015-01-21 DIAGNOSIS — R112 Nausea with vomiting, unspecified: Secondary | ICD-10-CM | POA: Diagnosis present

## 2015-01-21 DIAGNOSIS — Z8639 Personal history of other endocrine, nutritional and metabolic disease: Secondary | ICD-10-CM | POA: Diagnosis not present

## 2015-01-21 DIAGNOSIS — Z79899 Other long term (current) drug therapy: Secondary | ICD-10-CM | POA: Insufficient documentation

## 2015-01-21 DIAGNOSIS — Z862 Personal history of diseases of the blood and blood-forming organs and certain disorders involving the immune mechanism: Secondary | ICD-10-CM | POA: Insufficient documentation

## 2015-01-21 DIAGNOSIS — K8020 Calculus of gallbladder without cholecystitis without obstruction: Secondary | ICD-10-CM | POA: Insufficient documentation

## 2015-01-21 DIAGNOSIS — R55 Syncope and collapse: Secondary | ICD-10-CM | POA: Insufficient documentation

## 2015-01-21 DIAGNOSIS — Z8701 Personal history of pneumonia (recurrent): Secondary | ICD-10-CM | POA: Insufficient documentation

## 2015-01-21 DIAGNOSIS — R109 Unspecified abdominal pain: Secondary | ICD-10-CM

## 2015-01-21 DIAGNOSIS — R011 Cardiac murmur, unspecified: Secondary | ICD-10-CM | POA: Diagnosis not present

## 2015-01-21 LAB — I-STAT CG4 LACTIC ACID, ED: LACTIC ACID, VENOUS: 1.5 mmol/L (ref 0.5–2.0)

## 2015-01-21 LAB — COMPREHENSIVE METABOLIC PANEL
ALT: 13 U/L (ref 0–35)
AST: 17 U/L (ref 0–37)
Albumin: 3.4 g/dL — ABNORMAL LOW (ref 3.5–5.2)
Alkaline Phosphatase: 46 U/L (ref 39–117)
Anion gap: 10 (ref 5–15)
BUN: 24 mg/dL — ABNORMAL HIGH (ref 6–23)
CALCIUM: 8.8 mg/dL (ref 8.4–10.5)
CO2: 31 mmol/L (ref 19–32)
Chloride: 97 mmol/L (ref 96–112)
Creatinine, Ser: 4.97 mg/dL — ABNORMAL HIGH (ref 0.50–1.10)
GFR calc Af Amer: 9 mL/min — ABNORMAL LOW (ref >90)
GFR calc non Af Amer: 8 mL/min — ABNORMAL LOW (ref >90)
Glucose, Bld: 128 mg/dL — ABNORMAL HIGH (ref 70–99)
Potassium: 3.5 mmol/L (ref 3.5–5.1)
SODIUM: 138 mmol/L (ref 135–145)
Total Bilirubin: 0.5 mg/dL (ref 0.3–1.2)
Total Protein: 8.4 g/dL — ABNORMAL HIGH (ref 6.0–8.3)

## 2015-01-21 LAB — CBC WITH DIFFERENTIAL/PLATELET
Basophils Absolute: 0 10*3/uL (ref 0.0–0.1)
Basophils Relative: 0 % (ref 0–1)
Eosinophils Absolute: 0.1 10*3/uL (ref 0.0–0.7)
Eosinophils Relative: 4 % (ref 0–5)
HEMATOCRIT: 34.3 % — AB (ref 36.0–46.0)
HEMOGLOBIN: 10.6 g/dL — AB (ref 12.0–15.0)
LYMPHS ABS: 0.7 10*3/uL (ref 0.7–4.0)
Lymphocytes Relative: 31 % (ref 12–46)
MCH: 31.7 pg (ref 26.0–34.0)
MCHC: 30.9 g/dL (ref 30.0–36.0)
MCV: 102.7 fL — ABNORMAL HIGH (ref 78.0–100.0)
Monocytes Absolute: 0.4 10*3/uL (ref 0.1–1.0)
Monocytes Relative: 16 % — ABNORMAL HIGH (ref 3–12)
NEUTROS ABS: 1.1 10*3/uL — AB (ref 1.7–7.7)
NEUTROS PCT: 49 % (ref 43–77)
Platelets: 170 10*3/uL (ref 150–400)
RBC: 3.34 MIL/uL — AB (ref 3.87–5.11)
RDW: 15.7 % — ABNORMAL HIGH (ref 11.5–15.5)
WBC: 2.3 10*3/uL — AB (ref 4.0–10.5)

## 2015-01-21 LAB — I-STAT TROPONIN, ED: Troponin i, poc: 0.01 ng/mL (ref 0.00–0.08)

## 2015-01-21 LAB — LIPASE, BLOOD: Lipase: 68 U/L — ABNORMAL HIGH (ref 11–59)

## 2015-01-21 MED ORDER — SODIUM CHLORIDE 0.9 % IV BOLUS (SEPSIS)
500.0000 mL | Freq: Once | INTRAVENOUS | Status: AC
  Administered 2015-01-21: 500 mL via INTRAVENOUS

## 2015-01-21 MED ORDER — METOCLOPRAMIDE HCL 5 MG/ML IJ SOLN
10.0000 mg | Freq: Once | INTRAMUSCULAR | Status: AC
  Administered 2015-01-21: 10 mg via INTRAVENOUS
  Filled 2015-01-21: qty 2

## 2015-01-21 MED ORDER — ONDANSETRON HCL 4 MG/2ML IJ SOLN
4.0000 mg | Freq: Once | INTRAMUSCULAR | Status: AC
  Administered 2015-01-21: 4 mg via INTRAVENOUS
  Filled 2015-01-21: qty 2

## 2015-01-21 MED ORDER — ONDANSETRON 4 MG PO TBDP: 4.0000 mg | ORAL_TABLET | Freq: Three times a day (TID) | ORAL | Status: DC | PRN

## 2015-01-21 MED ORDER — IOHEXOL 300 MG/ML  SOLN
100.0000 mL | Freq: Once | INTRAMUSCULAR | Status: AC | PRN
  Administered 2015-01-21: 100 mL via INTRAVENOUS

## 2015-01-21 MED ORDER — IOHEXOL 300 MG/ML  SOLN
50.0000 mL | Freq: Once | INTRAMUSCULAR | Status: AC | PRN
  Administered 2015-01-21: 25 mL via ORAL

## 2015-01-21 NOTE — Telephone Encounter (Signed)
returned call vm not setup.... °

## 2015-01-21 NOTE — ED Notes (Signed)
Pt in CT.

## 2015-01-21 NOTE — ED Notes (Signed)
Bed: WA08 Expected date:  Expected time:  Means of arrival:  Comments: N,v after dialysis, ca pt

## 2015-01-21 NOTE — ED Notes (Signed)
Pt was discharged in the care of daughter. Pt was advised to follow up with PCP in three days. She was d/c'd with zofran and encouraged to take for nausea q8 hrs. Pt was assisted to lobby via wheelchair by NT [facility].

## 2015-01-21 NOTE — ED Provider Notes (Signed)
CSN: [id number]     Arrival date & time 01/21/15  0000 History   First MD Initiated Contact with Patient 01/21/15 0107     Chief Complaint  Patient presents with  . Nausea  . Emesis  . Near Syncope     (Consider location/radiation/quality/duration/timing/severity/associated sxs/prior Treatment) HPI [name] is a 74 y.o. female with past medical history of end-stage renal disease, hypertension, anemia, thrombocytopenia presenting today with nausea and vomiting. Patient states she ate a meal from outside facility on Wednesday. Thursday morning and afternoon she had nausea and vomiting multiple times. She also has a dialysis session today which was normal. Patient feels lightheaded and dizzy. She states she previously had abdominal pain as well. She is currently denying any pain. Patient also has history of chronic gas pains. History was also obtained from daughter who is in the room. Patient has no further complaints.  10 Systems reviewed and are negative for acute change except as noted in the HPI.   Past Medical History  Diagnosis Date  . Renal disorder   . ESRD (end stage renal disease)   . Hypertension   . Multiple myeloma 2009  . Thyroid disease   . Hyperparathyroidism   . Thrombocytopenia   . Anemia   . Shortness of breath   . Pneumonia   . GERD (gastroesophageal reflux disease)   . Arthritis     rt knee is stiff   Past Surgical History  Procedure Laterality Date  . Abdominal hysterectomy      Pt. denies   Family History  Problem Relation Age of Onset  . Hypertension Mother   . Hypertension Father    History  Substance Use Topics  . Smoking status: Never Smoker   . Smokeless tobacco: Never Used  . Alcohol Use: No   OB History    No data available     Review of Systems    Allergies  Review of patient's allergies indicates no known allergies.  Home Medications   Prior to Admission medications   Medication Sig Start Date End Date Taking?  Authorizing Provider  furosemide (LASIX) 20 MG tablet Take 1 tablet (20 mg total) by mouth daily. PRN for leg edema 12/27/14  Yes Heath Lark, MD  hydrALAZINE (APRESOLINE) 100 MG tablet Take 50 mg by mouth 2 (two) times daily.    Yes Historical Provider, MD  isosorbide mononitrate (IMDUR) 60 MG 24 hr tablet Take 60 mg by mouth daily.   Yes Historical Provider, MD  dexamethasone (DECADRON) 4 MG tablet Take 3 tablets (12 mg) on days 1,8,15, and 22 of chemotherapy. Patient not taking: Reported on 01/21/2015 06/14/14   Bernadene Bell, MD  HYDROcodone-homatropine (HYCODAN) 5-1.5 MG/5ML syrup Take 5 mLs by mouth every 6 (six) hours as needed for cough. Patient not taking: Reported on 01/21/2015 05/03/14   Carlton Adam, PA-C  Nutritional Supplements (FEEDING SUPPLEMENT, NEPRO CARB STEADY,) LIQD Take 237 mLs by mouth 2 (two) times daily between meals. Patient not taking: Reported on 01/21/2015 10/19/14   Karlene Einstein, MD  ondansetron (ZOFRAN) 8 MG tablet Take 1 tablet (8 mg total) by mouth 2 (two) times daily. Start the day after chemo for 2 days. Then as needed for nausea or vomiting. Patient not taking: Reported on 01/21/2015 03/25/14   Concha Norway, MD  prochlorperazine (COMPAZINE) 10 MG tablet Take 1 tablet (10 mg total) by mouth every 6 (six) hours as needed (Nausea or vomiting). Patient not taking: Reported on 01/21/2015 03/25/14  Concha Norway, MD   BP 170/74 mmHg  Pulse 55  Temp(Src) 98.1 F (36.7 C) (Oral)  Resp 13  SpO2 93% Physical Exam  Constitutional: She is oriented to person, place, and time. She appears well-developed and well-nourished. No distress.  HENT:  Head: Normocephalic and atraumatic.  Nose: Nose normal.  Mouth/Throat: Oropharynx is clear and moist. No oropharyngeal exudate.  Eyes: Conjunctivae and EOM are normal. Pupils are equal, round, and reactive to light. No scleral icterus.  Neck: Normal range of motion. Neck supple. JVD present. No tracheal deviation present. No thyromegaly  present.  Cardiovascular: Regular rhythm.  Exam reveals no gallop and no friction rub.   Murmur heard. Bradycardia  Pulmonary/Chest: Effort normal and breath sounds normal. No respiratory distress. She has no wheezes. She exhibits no tenderness.  Abdominal: Soft. Bowel sounds are normal. She exhibits no distension and no mass. There is no tenderness. There is no rebound and no guarding.  Musculoskeletal: Normal range of motion. She exhibits no edema or tenderness.  Left upper extremity AV fistula with palpable thrill.  Lymphadenopathy:    She has no cervical adenopathy.  Neurological: She is alert and oriented to person, place, and time. No cranial nerve deficit. She exhibits normal muscle tone.  Skin: Skin is warm and dry. No rash noted. She is not diaphoretic. No erythema. No pallor.  Nursing note and vitals reviewed.   ED Course  Procedures (including critical care time) Labs Review Labs Reviewed  CBC WITH DIFFERENTIAL/PLATELET - Abnormal; Notable for the following:    WBC 2.3 (*)    RBC 3.34 (*)    Hemoglobin 10.6 (*)    HCT 34.3 (*)    MCV 102.7 (*)    RDW 15.7 (*)    Neutro Abs 1.1 (*)    Monocytes Relative 16 (*)    All other components within normal limits  COMPREHENSIVE METABOLIC PANEL - Abnormal; Notable for the following:    Glucose, Bld 128 (*)    BUN 24 (*)    Creatinine, Ser 4.97 (*)    Total Protein 8.4 (*)    Albumin 3.4 (*)    GFR calc non Af Amer 8 (*)    GFR calc Af Amer 9 (*)    All other components within normal limits  LIPASE, BLOOD - Abnormal; Notable for the following:    Lipase 68 (*)    All other components within normal limits  I-STAT TROPOININ, ED  I-STAT CG4 LACTIC ACID, ED    Imaging Review Ct Abdomen Pelvis W Contrast  01/21/2015   CLINICAL DATA:  Acute abdominal pain, nausea, vomiting. End-stage renal disease. History of multiple myeloma.  EXAM: CT ABDOMEN AND PELVIS WITH CONTRAST  TECHNIQUE: Multidetector CT imaging of the abdomen and  pelvis was performed using the standard protocol following bolus administration of intravenous contrast.  CONTRAST:  135m OMNIPAQUE IOHEXOL 300 MG/ML  SOLN  COMPARISON:  None.  FINDINGS: Right basilar atelectasis or scarring. Heart is mildly enlarged. No pleural effusions.  Small gallstone layering within the gallbladder. Liver, spleen, pancreas, adrenals unremarkable. Kidneys are somewhat atrophic with scattered cysts. No hydronephrosis.  Ectasia of the infrarenal abdominal aorta measuring 2.8 cm. Stomach, large and small bowel grossly unremarkable except for scattered colonic diverticula. No active diverticulitis. Uterus, adnexa and urinary bladder grossly unremarkable. Rim calcified structure in the right pelvis is of unknown etiology but appears benign. No free fluid, free air or adenopathy. No acute bony abnormality.  IMPRESSION: Cholelithiasis.  No CT evidence of  acute cholecystitis.  Tortuous, at ectatic aorta.  Scattered colonic diverticula.  No active diverticulitis.  No acute findings in the abdomen or pelvis.   Electronically Signed   By: Rolm Baptise M.D.   On: 01/21/2015 03:35     EKG Interpretation   Date/Time:  Friday January 21 2015 00:24:58 EDT Ventricular Rate:  54 PR Interval:  193 QRS Duration: 96 QT Interval:  537 QTC Calculation: 509 R Axis:   8 Text Interpretation:  Sinus rhythm Probable left atrial enlargement TWI V2  and avL Prolonged QT interval Confirmed by Glynn Octave [phone])  on 01/21/2015 12:36:58 AM      MDM   Final diagnoses:  Abdominal pain, acute    Patient since emergency department for nausea vomiting and abdominal pain. Vital signs reveal bradycardia and I do not see any heart rate reducing medications. Patient was given Zofran for symptomatic relief. She also be given IV fluids for some dehydration. Will obtain CT scan of the abdomen and pelvis to evaluate for any pathology.  CT scan reveals cholelithiasis. Patient denies any relation with  greasy or spicy foods. She is advised on pain control, she was given primary care follow-up.  CT scan does not reveal any other cause of her abdominal pain. We'll discharge home with Zofran as needed. Her vital signs remain within her normal limits and she is safe for discharge.    Everlene Balls, MD 01/21/15 [phone]

## 2015-01-21 NOTE — Discharge Instructions (Signed)
Cholelithiasis [name], your CT scan results are below. See your primary care physician within 3 days for close follow-up. If symptoms worsen come back to emergency department immediately. Take tylenol as needed for the pain. Thank you. Cholelithiasis (also called gallstones) is a form of gallbladder disease. The gallbladder is a small organ that helps you digest fats. Symptoms of gallstones are:  Feeling sick to your stomach (nausea).  Throwing up (vomiting).  Belly pain.  Yellowing of the skin (jaundice).  Sudden pain. You may feel the pain for minutes to hours.  Fever.  Pain to the touch. HOME CARE  Only take medicines as told by your doctor.  Eat a low-fat diet until you see your doctor again. Eating fat can result in pain.  Follow up with your doctor as told. Attacks usually happen time after time. Surgery is usually needed for permanent treatment. GET HELP RIGHT AWAY IF:   Your pain gets worse.  Your pain is not helped by medicines.  You have a fever and lasting symptoms for more than 2-3 days.  You have a fever and your symptoms suddenly get worse.  You keep feeling sick to your stomach and throwing up. MAKE SURE YOU:   Understand these instructions.  Will watch your condition.  Will get help right away if you are not doing well or get worse. Document Released: 03/05/2008 Document Revised: 05/20/2013 Document Reviewed: 03/11/2013 [facility] Patient Information 2015 [place], [place]. This information is not intended to replace advice given to you by your health care provider. Make sure you discuss any questions you have with your health care provider.

## 2015-01-21 NOTE — ED Notes (Signed)
Pt returned from CT. Daughter remains at bedside.

## 2015-01-21 NOTE — ED Notes (Signed)
Pt from home via [facility] c/o nausea and vomiting onset about 1400 today. She hx CA and is a dialysis patient. Last dialysis was today and reports everything went was normal. 4mg  Zofran given enroute with 20 Rigth Hand IV access. Left arm dialysis access.

## 2015-01-24 ENCOUNTER — Ambulatory Visit: Payer: Medicare Other | Attending: Family Medicine | Admitting: Family Medicine

## 2015-01-24 ENCOUNTER — Ambulatory Visit: Payer: Self-pay

## 2015-01-24 ENCOUNTER — Other Ambulatory Visit: Payer: Self-pay

## 2015-01-24 ENCOUNTER — Encounter: Payer: Self-pay | Admitting: Family Medicine

## 2015-01-24 VITALS — BP 149/81 | HR 56 | Temp 98.5°F | Resp 18 | Ht 67.0 in | Wt 139.0 lb

## 2015-01-24 DIAGNOSIS — R140 Abdominal distension (gaseous): Secondary | ICD-10-CM

## 2015-01-24 DIAGNOSIS — N186 End stage renal disease: Secondary | ICD-10-CM | POA: Diagnosis not present

## 2015-01-24 DIAGNOSIS — K8020 Calculus of gallbladder without cholecystitis without obstruction: Secondary | ICD-10-CM | POA: Insufficient documentation

## 2015-01-24 DIAGNOSIS — C9000 Multiple myeloma not having achieved remission: Secondary | ICD-10-CM | POA: Insufficient documentation

## 2015-01-24 NOTE — Patient Instructions (Signed)
Cholelithiasis °Cholelithiasis (also called gallstones) is a form of gallbladder disease in which gallstones form in your gallbladder. The gallbladder is an organ that stores bile made in the liver, which helps digest fats. Gallstones begin as small crystals and slowly grow into stones. Gallstone pain occurs when the gallbladder spasms and a gallstone is blocking the duct. Pain can also occur when a stone passes out of the duct.  °RISK FACTORS °· Being female.   °· Having multiple pregnancies. Health care providers sometimes advise removing diseased gallbladders before future pregnancies.   °· Being obese. °· Eating a diet heavy in fried foods and fat.   °· Being older than 60 years and increasing age.   °· Prolonged use of medicines containing female hormones.   °· Having diabetes mellitus.   °· Rapidly losing weight.   °· Having a family history of gallstones (heredity).   °SYMPTOMS °· Nausea.   °· Vomiting. °· Abdominal pain.   °· Yellowing of the skin (jaundice).   °· Sudden pain. It may persist from several minutes to several hours. °· Fever.   °· Tenderness to the touch.  °In some cases, when gallstones do not move into the bile duct, people have no pain or symptoms. These are called "silent" gallstones.  °TREATMENT °Silent gallstones do not need treatment. In severe cases, emergency surgery may be required. Options for treatment include: °· Surgery to remove the gallbladder. This is the most common treatment. °· Medicines. These do not always work and may take 6-12 months or more to work. °· Shock wave treatment (extracorporeal biliary lithotripsy). In this treatment an ultrasound machine sends shock waves to the gallbladder to break gallstones into smaller pieces that can pass into the intestines or be dissolved by medicine. °HOME CARE INSTRUCTIONS  °· Only take over-the-counter or prescription medicines for pain, discomfort, or fever as directed by your health care provider.   °· Follow a low-fat diet until  seen again by your health care provider. Fat causes the gallbladder to contract, which can result in pain.   °· Follow up with your health care provider as directed. Attacks are almost always recurrent and surgery is usually required for permanent treatment.   °SEEK IMMEDIATE MEDICAL CARE IF:  °· Your pain increases and is not controlled by medicines.   °· You have a fever or persistent symptoms for more than 2-3 days.   °· You have a fever and your symptoms suddenly get worse.   °· You have persistent nausea and vomiting.   °MAKE SURE YOU:  °· Understand these instructions. °· Will watch your condition. °· Will get help right away if you are not doing well or get worse. °Document Released: 09/13/2005 Document Revised: 05/20/2013 Document Reviewed: 03/11/2013 °[employer]® Patient Information ©2015 [employer]. This information is not intended to replace advice given to you by your health care provider. Make sure you discuss any questions you have with your health care provider. ° °

## 2015-01-24 NOTE — Progress Notes (Signed)
Patient sick Thursday night, vomiting. Patient called ambulance. Stayed in ED throughout the night. Patient is not vomiting any longer but is still very weak. Patient reports not having an appetite, and has not been eating as she should.

## 2015-01-24 NOTE — Progress Notes (Signed)
Subjective:    Patient ID: [name], female    DOB: August 16, 1941, 74 y.o.   MRN: [id number]  HPI  [name] presented to the ED on 01/21/15 with nausea or vomiting, gas pains and abdominal pain and had reported a history of eating out the day prior to presentation. She was given IV fluids and Zofran and a CT scan of the abdomen and pelvis done revealed cholelithiasis but no cholecystitis. She was advised to follow-up with her primary care physician but she is upset that she had to wait 4 months for an appointment which is not until 02/04/15 with a physician and so she would like to answer her care here.  Interval history: She complains of burping a lot and feeling bloated; she has used Gas-X with no relief in symptoms and medical history is notable for gastroesophageal reflux disease but she denies reflux at this time. She states she is not bothered by the gallstones and is having no abdominal pain at this time.  Past Medical History  Diagnosis Date  . Renal disorder   . ESRD (end stage renal disease)   . Hypertension   . Multiple myeloma 2009  . Thyroid disease   . Hyperparathyroidism   . Thrombocytopenia   . Anemia   . Shortness of breath   . Pneumonia   . GERD (gastroesophageal reflux disease)   . Arthritis     rt knee is stiff    Past Surgical History  Procedure Laterality Date  . Abdominal hysterectomy      Pt. denies    History   Social History  . Marital Status: Married    Spouse Name: N/A  . Number of Children: 3  . Years of Education: N/A   Occupational History  . Retired      Pharmacist, hospital asst.   Social History Main Topics  . Smoking status: Never Smoker   . Smokeless tobacco: Never Used  . Alcohol Use: No  . Drug Use: No  . Sexual Activity: No   Other Topics Concern  . Not on file   Social History Narrative   Patient recently moved to [place] from [place] in May 2015.   [name] was born in [place], [place].   Patient has brother in [place]  area.    Family History  Problem Relation Age of Onset  . Hypertension Mother   . Hypertension Father     No Known Allergies  Current Outpatient Prescriptions on File Prior to Visit  Medication Sig Dispense Refill  . furosemide (LASIX) 20 MG tablet Take 1 tablet (20 mg total) by mouth daily. PRN for leg edema 60 tablet 2  . hydrALAZINE (APRESOLINE) 100 MG tablet Take 50 mg by mouth 2 (two) times daily.     . isosorbide mononitrate (IMDUR) 60 MG 24 hr tablet Take 60 mg by mouth daily.    . ondansetron (ZOFRAN ODT) 4 MG disintegrating tablet Take 1 tablet (4 mg total) by mouth every 8 (eight) hours as needed for nausea or vomiting. 12 tablet 0  . dexamethasone (DECADRON) 4 MG tablet Take 3 tablets (12 mg) on days 1,8,15, and 22 of chemotherapy. (Patient not taking: Reported on 01/21/2015) 24 tablet 2  . HYDROcodone-homatropine (HYCODAN) 5-1.5 MG/5ML syrup Take 5 mLs by mouth every 6 (six) hours as needed for cough. (Patient not taking: Reported on 01/21/2015) 120 mL 0  . Nutritional Supplements (FEEDING SUPPLEMENT, NEPRO CARB STEADY,) LIQD Take 237 mLs by mouth 2 (two) times daily  between meals. (Patient not taking: Reported on 01/21/2015) 237 mL 60  . prochlorperazine (COMPAZINE) 10 MG tablet Take 1 tablet (10 mg total) by mouth every 6 (six) hours as needed (Nausea or vomiting). (Patient not taking: Reported on 01/21/2015) 30 tablet 1   No current facility-administered medications on file prior to visit.     Review of Systems  Constitutional: Negative for activity change, appetite change and fatigue.  HENT: Negative for congestion, sinus pressure and sore throat.   Eyes: Negative for visual disturbance.  Respiratory: Negative for cough, chest tightness, shortness of breath and wheezing.   Cardiovascular: Negative for chest pain and palpitations.  Gastrointestinal:       See HPI  Endocrine: Negative for polydipsia.  Genitourinary: Negative for dysuria and frequency.  Musculoskeletal:  Negative for back pain and arthralgias.  Skin: Negative for rash.  Neurological: Negative for tremors, light-headedness and numbness.  Hematological: Does not bruise/bleed easily.  Psychiatric/Behavioral: Negative for behavioral problems and agitation.         Objective: Filed Vitals:   01/24/15 1537  BP: 149/81  Pulse: 56  Temp: 98.5 F (36.9 C)  Resp: 18      Physical Exam  Constitutional: She is oriented to person, place, and time. She appears well-developed and well-nourished. No distress.  HENT:  Head: Normocephalic.  Right Ear: External ear normal.  Left Ear: External ear normal.  Nose: Nose normal.  Mouth/Throat: Oropharynx is clear and moist.  Eyes: Conjunctivae and EOM are normal. Pupils are equal, round, and reactive to light.  Neck: Normal range of motion. No JVD present.  Cardiovascular: Regular rhythm and intact distal pulses.  Bradycardia present.  Exam reveals no gallop.   Murmur heard.  Systolic murmur is present with a grade of 3/6  Pulmonary/Chest: Effort normal and breath sounds normal. No respiratory distress. She has no wheezes. She has no rales. She exhibits no tenderness.  Abdominal: Soft. Bowel sounds are normal. She exhibits no distension and no mass. There is no tenderness.  Musculoskeletal: Normal range of motion. She exhibits edema. She exhibits no tenderness.  Bilateral non pitting pedal edema  Neurological: She is alert and oriented to person, place, and time. She has normal reflexes.  Skin: She is not diaphoretic.  Left arm with AV fistula and palpable thrill  Psychiatric: She has a normal mood and affect.    EXAM: CT ABDOMEN AND PELVIS WITH CONTRAST  TECHNIQUE: Multidetector CT imaging of the abdomen and pelvis was performed using the standard protocol following bolus administration of intravenous contrast.  CONTRAST: 145m OMNIPAQUE IOHEXOL 300 MG/ML SOLN  COMPARISON: None.  FINDINGS: Right basilar atelectasis or scarring.  Heart is mildly enlarged. No pleural effusions.  Small gallstone layering within the gallbladder. Liver, spleen, pancreas, adrenals unremarkable. Kidneys are somewhat atrophic with scattered cysts. No hydronephrosis.  Ectasia of the infrarenal abdominal aorta measuring 2.8 cm. Stomach, large and small bowel grossly unremarkable except for scattered colonic diverticula. No active diverticulitis. Uterus, adnexa and urinary bladder grossly unremarkable. Rim calcified structure in the right pelvis is of unknown etiology but appears benign. No free fluid, free air or adenopathy. No acute bony abnormality.  IMPRESSION: Cholelithiasis. No CT evidence of acute cholecystitis.  Tortuous, at ectatic aorta.  Scattered colonic diverticula. No active diverticulitis.  No acute findings in the abdomen or pelvis.   Electronically Signed  By: KRolm BaptiseM.D.  On: 01/21/2015 03:35      Assessment & Plan:  74year old female with a history of end-stage renal  disease on hemodialysis, multiple myeloma followed by oncology, hypertension with a recent ED visit for nausea and vomiting and finding of cholelithiasis on abdominal imaging.  Cholelithiasis and gassiness. Imaging negative for cholecystitis or obstruction. She is asymptomatic at this time and would like to hold off on referral to general surgery. I have encouraged her to keep a food diary this would enable her Detective there are certain foods that worsen her gassiness. She is refusing initiation of proton pump inhibitors and I would proceed with Helicobacter testing if symptoms persist at the next office visit. Advised to report to the clinic in the event that abdominal pain returned that she would need to be referred to general surgery.  Multiple myeloma: As per oncology.  End-stage renal disease and hemodialysis: Hemodialysis on Tuesday, Thursday and Saturday.

## 2015-01-26 ENCOUNTER — Ambulatory Visit: Payer: Self-pay

## 2015-01-28 ENCOUNTER — Other Ambulatory Visit: Payer: Self-pay | Admitting: Hematology and Oncology

## 2015-01-28 ENCOUNTER — Encounter: Payer: Self-pay | Admitting: Hematology and Oncology

## 2015-01-28 ENCOUNTER — Ambulatory Visit (HOSPITAL_BASED_OUTPATIENT_CLINIC_OR_DEPARTMENT_OTHER): Payer: Medicare Other

## 2015-01-28 ENCOUNTER — Other Ambulatory Visit (HOSPITAL_BASED_OUTPATIENT_CLINIC_OR_DEPARTMENT_OTHER): Payer: Medicare Other

## 2015-01-28 ENCOUNTER — Ambulatory Visit (HOSPITAL_BASED_OUTPATIENT_CLINIC_OR_DEPARTMENT_OTHER): Payer: Medicare Other | Admitting: Hematology and Oncology

## 2015-01-28 VITALS — BP 139/83 | HR 52 | Temp 97.6°F | Resp 16 | Ht 67.0 in | Wt 130.8 lb

## 2015-01-28 DIAGNOSIS — N186 End stage renal disease: Secondary | ICD-10-CM | POA: Diagnosis not present

## 2015-01-28 DIAGNOSIS — C9000 Multiple myeloma not having achieved remission: Secondary | ICD-10-CM | POA: Diagnosis present

## 2015-01-28 DIAGNOSIS — D701 Agranulocytosis secondary to cancer chemotherapy: Secondary | ICD-10-CM

## 2015-01-28 DIAGNOSIS — Z5112 Encounter for antineoplastic immunotherapy: Secondary | ICD-10-CM

## 2015-01-28 DIAGNOSIS — D630 Anemia in neoplastic disease: Secondary | ICD-10-CM | POA: Diagnosis not present

## 2015-01-28 DIAGNOSIS — D702 Other drug-induced agranulocytosis: Secondary | ICD-10-CM

## 2015-01-28 DIAGNOSIS — D61818 Other pancytopenia: Secondary | ICD-10-CM

## 2015-01-28 LAB — CBC WITH DIFFERENTIAL/PLATELET
BASO%: 0 % (ref 0.0–2.0)
Basophils Absolute: 0 10*3/uL (ref 0.0–0.1)
EOS ABS: 0.1 10*3/uL (ref 0.0–0.5)
EOS%: 2.4 % (ref 0.0–7.0)
HEMATOCRIT: 35.5 % (ref 34.8–46.6)
HGB: 10.9 g/dL — ABNORMAL LOW (ref 11.6–15.9)
LYMPH%: 32 % (ref 14.0–49.7)
MCH: 31.3 pg (ref 25.1–34.0)
MCHC: 30.7 g/dL — ABNORMAL LOW (ref 31.5–36.0)
MCV: 102 fL — ABNORMAL HIGH (ref 79.5–101.0)
MONO#: 0.3 10*3/uL (ref 0.1–0.9)
MONO%: 13.1 % (ref 0.0–14.0)
NEUT#: 1.1 10*3/uL — ABNORMAL LOW (ref 1.5–6.5)
NEUT%: 52.5 % (ref 38.4–76.8)
Platelets: 154 10*3/uL (ref 145–400)
RBC: 3.48 10*6/uL — ABNORMAL LOW (ref 3.70–5.45)
RDW: 16.4 % — ABNORMAL HIGH (ref 11.2–14.5)
WBC: 2.1 10*3/uL — ABNORMAL LOW (ref 3.9–10.3)
lymph#: 0.7 10*3/uL — ABNORMAL LOW (ref 0.9–3.3)

## 2015-01-28 LAB — COMPREHENSIVE METABOLIC PANEL (CC13)
ALT: 10 U/L (ref 0–55)
AST: 15 U/L (ref 5–34)
Albumin: 3.3 g/dL — ABNORMAL LOW (ref 3.5–5.0)
Alkaline Phosphatase: 47 U/L (ref 40–150)
Anion Gap: 13 mEq/L — ABNORMAL HIGH (ref 3–11)
BILIRUBIN TOTAL: 0.48 mg/dL (ref 0.20–1.20)
BUN: 18.6 mg/dL (ref 7.0–26.0)
CHLORIDE: 96 meq/L — AB (ref 98–109)
CO2: 26 mEq/L (ref 22–29)
Calcium: 10 mg/dL (ref 8.4–10.4)
Creatinine: 6.1 mg/dL (ref 0.6–1.1)
EGFR: 7 mL/min/{1.73_m2} — AB (ref >90)
GLUCOSE: 122 mg/dL (ref 70–140)
Potassium: 3.3 mEq/L — ABNORMAL LOW (ref 3.5–5.1)
Sodium: 134 mEq/L — ABNORMAL LOW (ref 136–145)
Total Protein: 8.5 g/dL — ABNORMAL HIGH (ref 6.4–8.3)

## 2015-01-28 MED ORDER — FILGRASTIM 480 MCG/0.8ML IJ SOSY
480.0000 ug | PREFILLED_SYRINGE | Freq: Once | INTRAMUSCULAR | Status: AC
  Administered 2015-01-28: 480 ug via SUBCUTANEOUS
  Filled 2015-01-28: qty 0.8

## 2015-01-28 MED ORDER — DIPHENHYDRAMINE HCL 25 MG PO CAPS
50.0000 mg | ORAL_CAPSULE | Freq: Once | ORAL | Status: AC
  Administered 2015-01-28: 50 mg via ORAL

## 2015-01-28 MED ORDER — ACETAMINOPHEN 325 MG PO TABS
650.0000 mg | ORAL_TABLET | Freq: Once | ORAL | Status: AC
  Administered 2015-01-28: 650 mg via ORAL

## 2015-01-28 MED ORDER — TBO-FILGRASTIM 480 MCG/0.8ML ~~LOC~~ SOSY: 480.0000 ug | PREFILLED_SYRINGE | Freq: Once | SUBCUTANEOUS | Status: DC

## 2015-01-28 MED ORDER — ONDANSETRON HCL 40 MG/20ML IJ SOLN
Freq: Once | INTRAMUSCULAR | Status: AC
  Administered 2015-01-28: 10:00:00 via INTRAVENOUS
  Filled 2015-01-28: qty 4

## 2015-01-28 MED ORDER — METHYLPREDNISOLONE SODIUM SUCC 125 MG IJ SOLR
125.0000 mg | Freq: Once | INTRAMUSCULAR | Status: AC
  Administered 2015-01-28: 125 mg via INTRAVENOUS

## 2015-01-28 MED ORDER — METHYLPREDNISOLONE SODIUM SUCC 125 MG IJ SOLR
INTRAMUSCULAR | Status: AC
Start: 2015-01-28 — End: 2015-01-28
  Filled 2015-01-28: qty 2

## 2015-01-28 MED ORDER — DIPHENHYDRAMINE HCL 25 MG PO CAPS
ORAL_CAPSULE | ORAL | Status: AC
  Filled 2015-01-28: qty 2

## 2015-01-28 MED ORDER — ACETAMINOPHEN 325 MG PO TABS
ORAL_TABLET | ORAL | Status: AC
Start: 2015-01-28 — End: 2015-01-28
  Filled 2015-01-28: qty 2

## 2015-01-28 MED ORDER — SODIUM CHLORIDE 0.9 % IV SOLN
15.5000 mg/kg | Freq: Once | INTRAVENOUS | Status: AC
  Administered 2015-01-28: 1000 mg via INTRAVENOUS
  Filled 2015-01-28: qty 50

## 2015-01-28 MED ORDER — SODIUM CHLORIDE 0.9 % IV SOLN
Freq: Once | INTRAVENOUS | Status: AC
  Administered 2015-01-28: 09:00:00 via INTRAVENOUS

## 2015-01-28 NOTE — Assessment & Plan Note (Signed)
She tolerated treatment well. I have redrawn her blood work to see if she is rest bonding to treatment on not. The patient is very forgetful. I will try to work around her infusion schedule and see her every few weeks. If she has no response to treatment within 8 weeks of treatment, I will stop and have another discussion with her about plan of care.

## 2015-01-28 NOTE — Assessment & Plan Note (Signed)
She is not symptomatic and does not require transfusion or erythropoietin stimulating agent right now.

## 2015-01-28 NOTE — Assessment & Plan Note (Signed)
She will continue hemodialysis 3 times a week through the fistula on Tuesday, Thursday and Saturday.

## 2015-01-28 NOTE — Progress Notes (Signed)
[facility] OFFICE PROGRESS NOTE  Patient Care Team: Riccardo Dubin, MD as PCP - General (Internal Medicine)  SUMMARY OF ONCOLOGIC HISTORY:   Multiple myeloma   03/30/2008 Initial Diagnosis Multiple myeloma   03/30/2008 - 05/01/2012 Chemotherapy Revlimid/Dex then Velcade (started on 07/13/2011 ending on 09/14/2011) without response.  Managed by Dr. Chauncey Fischer of [place], [place] [address]   07/24/2010 -  Chemotherapy Received zometa  intermittently over one year.  Creatinine was 1.0 on 07/24/2010. 1.7 on 08/10/2011.    05/01/2012 - 09/01/2012 Chemotherapy Starte Pomalidomide s/p 4 cycles. Referred to [facility] for consideration of ASCT.     10/08/2012 Bone Marrow Biopsy Normocellular marrow with residual/recurrently plasma cell myeloma, 60-70 % of overall marrow celluarity.  Flow: c/w plasma cell neoplasm, plasma cell infiltrate comprises 60-70 % of overall marrow cellularity; FISH: + for 1 q21/CKS1B gain; RB1 (13q14)   10/08/2012 Bone Marrow Biopsy Continued... Cytogenetics showed normal female karyotype.    10/29/2012 Tumor Marker High IgG (6019), high lamda free light chain (409.32) with low IgA, IgM and kappa free light chain.    04/21/2013 Imaging Skeletal survey.  No suspcious lytic or blastic lesions visualized.  Severe multilevel degenerative disc disease in the cervical spine.    04/29/2013 Imaging PeT. No discrete suspicious focus of FDG uptake identified.    05/20/2013 - 06/09/2013 Chemotherapy Started carfilxomib 39 mg on 08/25, 08/26, day 8 (06/02/13), day 9 on 06/03/2013 and on 06/09/2013.     12/03/2013 Treatment Plan Change Last office visit with Dr. Glenna Durand.  She decline further chemotherapy.   [name]    03/11/2014 - 03/17/2014 Hospital Admission Admitted for low hemoglobin. Dr. Burney Gauze consulted.  SPEP,  UPEP and Bone marrow biopsy obtained.    IgG 11,300. M-spike 7.52, kappa lamda ratio 0  with lambda free light chains totaling 1260.    03/12/2014 Imaging Skeletal survey. 1. Small lytic lesions within the skull and right scapula. 2. Degenerative changes in the spine and knees, right greater than left. 3. Cardiomegaly without pulmonary edema.   03/17/2014 Bone Marrow Biopsy Hypercellular bone marrow tih extensive involvment by plasma cell neoplasm (Plasma cells 85%). FISH: Presence of 2 copies of the IGH/FGFR3, ATM, CCND1/IGH, and p53 probes.  Loss of 13q34 probes in 40% of cells. gain of chromose 12 and either loss of chorm   03/25/2014 -  Chemotherapy Planning Kyprolis 15 mg/m2 plus dexamethasone weekly plus revlimid (68m) daily on 06/29.   06/14/2014 -  Chemotherapy Patient's last dose of Kyprolis was on 05/26/14. Since then chemo held due to neutropenia despite GCSF support. Revlimid also on hold as of 06/14/14   06/28/2014 -  Chemotherapy New cycle of Kyprolis starts today.   07/07/2014 Tumor Marker IgG 3960    07/23/2014 Tumor Marker IgG 3620   10/04/2014 Tumor Marker IgG 2230 with M spike down to 0.06   12/20/2014 Tumor Marker  repeat M spike show 1.9 g IgG kappa. IgG level 2360 and lambda light chains at 333   01/07/2015 -  Chemotherapy Her treatment is switched to palliative treatment with Daratumumab    INTERVAL HISTORY: Please see below for problem oriented charting. She is seen today prior to cycle 3 of therapy. She tolerated treatment well. She had recent epigastric pain and went to the emergency department. Acute cholecystitis was ruled out. She denies recent infection.  REVIEW OF SYSTEMS:   Constitutional: Denies fevers, chills or  abnormal weight loss Eyes: Denies blurriness of vision Ears, nose, mouth, throat, and face: Denies mucositis or sore throat Respiratory: Denies cough, dyspnea or wheezes Cardiovascular: Denies palpitation, chest discomfort or lower extremity swelling Gastrointestinal:  Denies nausea, heartburn or change in bowel habits Skin: Denies abnormal skin  rashes Lymphatics: Denies new lymphadenopathy or easy bruising Neurological:Denies numbness, tingling or new weaknesses Behavioral/Psych: Mood is stable, no new changes  All other systems were reviewed with the patient and are negative.  I have reviewed the past medical history, past surgical history, social history and family history with the patient and they are unchanged from previous note.  ALLERGIES:  has No Known Allergies.  MEDICATIONS:  Current Outpatient Prescriptions  Medication Sig Dispense Refill  . dexamethasone (DECADRON) 4 MG tablet Take 3 tablets (12 mg) on days 1,8,15, and 22 of chemotherapy. (Patient not taking: Reported on 01/21/2015) 24 tablet 2  . furosemide (LASIX) 20 MG tablet Take 1 tablet (20 mg total) by mouth daily. PRN for leg edema 60 tablet 2  . hydrALAZINE (APRESOLINE) 100 MG tablet Take 50 mg by mouth 2 (two) times daily.     Marland Kitchen HYDROcodone-homatropine (HYCODAN) 5-1.5 MG/5ML syrup Take 5 mLs by mouth every 6 (six) hours as needed for cough. (Patient not taking: Reported on 01/21/2015) 120 mL 0  . isosorbide mononitrate (IMDUR) 60 MG 24 hr tablet Take 60 mg by mouth daily.    . Nutritional Supplements (FEEDING SUPPLEMENT, NEPRO CARB STEADY,) LIQD Take 237 mLs by mouth 2 (two) times daily between meals. (Patient not taking: Reported on 01/21/2015) 237 mL 60  . ondansetron (ZOFRAN ODT) 4 MG disintegrating tablet Take 1 tablet (4 mg total) by mouth every 8 (eight) hours as needed for nausea or vomiting. 12 tablet 0  . prochlorperazine (COMPAZINE) 10 MG tablet Take 1 tablet (10 mg total) by mouth every 6 (six) hours as needed (Nausea or vomiting). (Patient not taking: Reported on 01/21/2015) 30 tablet 1   No current facility-administered medications for this visit.    PHYSICAL EXAMINATION: ECOG PERFORMANCE STATUS: 1 - Symptomatic but completely ambulatory GENERAL:alert, no distress and comfortable SKIN: skin color, texture, turgor are normal, no rashes or significant  lesions EYES: normal, Conjunctiva are pink and non-injected, sclera clear Musculoskeletal:no cyanosis of digits and no clubbing  NEURO: alert & oriented x 3 with fluent speech, no focal motor/sensory deficits  LABORATORY DATA:  I have reviewed the data as listed    Component Value Date/Time   NA 134* 01/28/2015 0834   NA 138 01/21/2015 0032   K 3.3* 01/28/2015 0834   K 3.5 01/21/2015 0032   CL 97 01/21/2015 0032   CO2 26 01/28/2015 0834   CO2 31 01/21/2015 0032   GLUCOSE 122 01/28/2015 0834   GLUCOSE 128* 01/21/2015 0032   BUN 18.6 01/28/2015 0834   BUN 24* 01/21/2015 0032   CREATININE 6.1* 01/28/2015 0834   CREATININE 4.97* 01/21/2015 0032   CALCIUM 10.0 01/28/2015 0834   CALCIUM 8.8 01/21/2015 0032   PROT 8.5* 01/28/2015 0834   PROT 8.4* 01/21/2015 0032   ALBUMIN 3.3* 01/28/2015 0834   ALBUMIN 3.4* 01/21/2015 0032   AST 15 01/28/2015 0834   AST 17 01/21/2015 0032   ALT 10 01/28/2015 0834   ALT 13 01/21/2015 0032   ALKPHOS 47 01/28/2015 0834   ALKPHOS 46 01/21/2015 0032   BILITOT 0.48 01/28/2015 0834   BILITOT 0.5 01/21/2015 0032   GFRNONAA 8* 01/21/2015 0032   GFRAA 9* 01/21/2015 0032  No results found for: SPEP, UPEP  Lab Results  Component Value Date   WBC 2.1* 01/28/2015   NEUTROABS 1.1* 01/28/2015   HGB 10.9* 01/28/2015   HCT 35.5 01/28/2015   MCV 102.0* 01/28/2015   PLT 154 01/28/2015      Chemistry      Component Value Date/Time   NA 134* 01/28/2015 0834   NA 138 01/21/2015 0032   K 3.3* 01/28/2015 0834   K 3.5 01/21/2015 0032   CL 97 01/21/2015 0032   CO2 26 01/28/2015 0834   CO2 31 01/21/2015 0032   BUN 18.6 01/28/2015 0834   BUN 24* 01/21/2015 0032   CREATININE 6.1* 01/28/2015 0834   CREATININE 4.97* 01/21/2015 0032      Component Value Date/Time   CALCIUM 10.0 01/28/2015 0834   CALCIUM 8.8 01/21/2015 0032   ALKPHOS 47 01/28/2015 0834   ALKPHOS 46 01/21/2015 0032   AST 15 01/28/2015 0834   AST 17 01/21/2015 0032   ALT 10  01/28/2015 0834   ALT 13 01/21/2015 0032   BILITOT 0.48 01/28/2015 0834   BILITOT 0.5 01/21/2015 0032      ASSESSMENT & PLAN:  Multiple myeloma She tolerated treatment well. I have redrawn her blood work to see if she is rest bonding to treatment on not. The patient is very forgetful. I will try to work around her infusion schedule and see her every few weeks. If she has no response to treatment within 8 weeks of treatment, I will stop and have another discussion with her about plan of care.   Anemia in neoplastic disease She is not symptomatic and does not require transfusion or erythropoietin stimulating agent right now.    Drug-induced neutropenia This is likely due to recent treatment. The patient denies recent history of fevers, cough, chills, diarrhea or dysuria. She is asymptomatic from the leukopenia. I will observe for now.  I will continue the chemotherapy at current dose without dosage adjustment.  If the leukopenia gets progressive worse in the future, I might have to delay her treatment or adjust the chemotherapy dose. She will get GCSF support on Fridays    ESRD (end stage renal disease) She will continue hemodialysis 3 times a week through the fistula on Tuesday, Thursday and Saturday.      No orders of the defined types were placed in this encounter.   All questions were answered. The patient knows to call the clinic with any problems, questions or concerns. No barriers to learning was detected. I spent 25 minutes counseling the patient face to face. The total time spent in the appointment was 30 minutes and more than 50% was on counseling and review of test results     [facility], [place], MD 01/28/2015 3:21 PM

## 2015-01-28 NOTE — Patient Instructions (Addendum)
[facility] Discharge Instructions for Patients Receiving Chemotherapy  Today you received the following chemotherapy agents Daratumumab  To help prevent nausea and vomiting after your treatment, we encourage you to take your nausea medication as prescribed.    If you develop nausea and vomiting that is not controlled by your nausea medication, call the clinic.   BELOW ARE SYMPTOMS THAT SHOULD BE REPORTED IMMEDIATELY:  *FEVER GREATER THAN 100.5 F  *CHILLS WITH OR WITHOUT FEVER  NAUSEA AND VOMITING THAT IS NOT CONTROLLED WITH YOUR NAUSEA MEDICATION  *UNUSUAL SHORTNESS OF BREATH  *UNUSUAL BRUISING OR BLEEDING  TENDERNESS IN MOUTH AND THROAT WITH OR WITHOUT PRESENCE OF ULCERS  *URINARY PROBLEMS  *BOWEL PROBLEMS  UNUSUAL RASH Items with * indicate a potential emergency and should be followed up as soon as possible.  Feel free to call the clinic you have any questions or concerns. The clinic phone number is [phone].  Please show the [facility] at check-in to the Emergency Department and triage nurse.  Filgrastim, G-CSF injection What is this medicine? FILGRASTIM, G-CSF (fil GRA stim) is a granulocyte colony-stimulating factor that stimulates the growth of neutrophils, a type of white blood cell (WBC) important in the body's fight against infection. It is used to reduce the incidence of fever and infection in patients with certain types of cancer who are receiving chemotherapy that affects the bone marrow, to stimulate blood cell production for removal of WBCs from the body prior to a bone marrow transplantation, to reduce the incidence of fever and infection in patients who have severe chronic neutropenia, and to improve survival outcomes following high-dose radiation exposure that is toxic to the bone marrow. This medicine may be used for other purposes; ask your health care provider or pharmacist if you have questions. COMMON BRAND NAME(S): Neupogen What  should I tell my health care provider before I take this medicine? They need to know if you have any of these conditions: -latex allergy -ongoing radiation therapy -sickle cell disease -an unusual or allergic reaction to filgrastim, pegfilgrastim, other medicines, foods, dyes, or preservatives -pregnant or trying to get pregnant -breast-feeding How should I use this medicine? This medicine is for injection under the skin. If you get this medicine at home, you will be taught how to prepare and give this medicine. Refer to the Instructions for Use that come with your medication packaging. Use exactly as directed. Take your medicine at regular intervals. Do not take your medicine more often than directed. It is important that you put your used needles and syringes in a special sharps container. Do not put them in a trash can. If you do not have a sharps container, call your pharmacist or healthcare provider to get one. Talk to your pediatrician regarding the use of this medicine in children. While this drug may be prescribed for children as young as 7 months for selected conditions, precautions do apply. Overdosage: If you think you have taken too much of this medicine contact a poison control center or emergency room at once. NOTE: This medicine is only for you. Do not share this medicine with others. What if I miss a dose? It is important not to miss your dose. Call your doctor or health care professional if you miss a dose. What may interact with this medicine? This medicine may interact with the following medications: -medicines that may cause a release of neutrophils, such as lithium This list may not describe all possible interactions. Give your health  care provider a list of all the medicines, herbs, non-prescription drugs, or dietary supplements you use. Also tell them if you smoke, drink alcohol, or use illegal drugs. Some items may interact with your medicine. What should I watch for while  using this medicine? You may need blood work done while you are taking this medicine. What side effects may I notice from receiving this medicine? Side effects that you should report to your doctor or health care professional as soon as possible: -allergic reactions like skin rash, itching or hives, swelling of the face, lips, or tongue -dizziness or feeling faint -fever -pain, redness, or irritation at site where injected -pinpoint red spots on the skin -shortness of breath or breathing problems -stomach or side pain, or pain at the shoulder -swelling -tiredness -trouble passing urine -unusual bleeding or bruising Side effects that usually do not require medical attention (report to your doctor or health care professional if they continue or are bothersome): -bone pain -headache -muscle pain This list may not describe all possible side effects. Call your doctor for medical advice about side effects. You may report side effects to FDA at 1-800-FDA-1088. Where should I keep my medicine? Keep out of the reach of children. Store in a refrigerator between 2 and 8 degrees C (36 and 46 degrees F). Do not freeze. Keep in carton to protect from light. Throw away this medicine if vials or syringes are left out of the refrigerator for more than 24 hours. Throw away any unused medicine after the expiration date. NOTE: This sheet is a summary. It may not cover all possible information. If you have questions about this medicine, talk to your doctor, pharmacist, or health care provider.  2015, [employer]. (2014-01-01 17:00:01)

## 2015-01-28 NOTE — Progress Notes (Signed)
Per [name], [place] to treat with ANC 1.1; Cmet pending at time of notification. Per MD, patient to get Granix with chemo today.   Patient asking about pneumonia vaccine today. Per MD, hold this injection while WBC low. Patient informed, she will tell dialysis center not to administer.

## 2015-01-28 NOTE — Assessment & Plan Note (Signed)
This is likely due to recent treatment. The patient denies recent history of fevers, cough, chills, diarrhea or dysuria. She is asymptomatic from the leukopenia. I will observe for now.  I will continue the chemotherapy at current dose without dosage adjustment.  If the leukopenia gets progressive worse in the future, I might have to delay her treatment or adjust the chemotherapy dose. She will get GCSF support on Fridays

## 2015-01-31 ENCOUNTER — Other Ambulatory Visit: Payer: Self-pay

## 2015-01-31 ENCOUNTER — Ambulatory Visit: Payer: Self-pay

## 2015-02-01 LAB — SPEP & IFE WITH QIG
ABNORMAL PROTEIN BAND1: 2.5 g/dL
ALPHA-1-GLOBULIN: 0.3 g/dL (ref 0.2–0.3)
ALPHA-2-GLOBULIN: 0.6 g/dL (ref 0.5–0.9)
Albumin ELP: 4.2 g/dL (ref 3.8–4.8)
BETA 2: 0.2 g/dL (ref 0.2–0.5)
Beta Globulin: 0.3 g/dL — ABNORMAL LOW (ref 0.4–0.6)
GAMMA GLOBULIN: 2.7 g/dL — AB (ref 0.8–1.7)
IgA: <6 mg/dL — ABNORMAL LOW (ref 69–380)
IgG (Immunoglobin G), Serum: 3170 mg/dL — ABNORMAL HIGH (ref 690–1700)
IgM, Serum: <5 mg/dL — ABNORMAL LOW (ref 52–322)
Total Protein, Serum Electrophoresis: 8.2 g/dL — ABNORMAL HIGH (ref 6.1–8.1)

## 2015-02-01 LAB — KAPPA/LAMBDA LIGHT CHAINS
KAPPA FREE LGHT CHN: 0.77 mg/dL (ref 0.33–1.94)
KAPPA LAMBDA RATIO: 0 — AB (ref 0.26–1.65)
Lambda Free Lght Chn: 467 mg/dL — ABNORMAL HIGH (ref 0.57–2.63)

## 2015-02-01 LAB — BETA 2 MICROGLOBULIN, SERUM: Beta-2 Microglobulin: 45.3 mg/L — ABNORMAL HIGH (ref <=2.51)

## 2015-02-02 ENCOUNTER — Ambulatory Visit: Payer: Self-pay

## 2015-02-04 ENCOUNTER — Other Ambulatory Visit (HOSPITAL_BASED_OUTPATIENT_CLINIC_OR_DEPARTMENT_OTHER): Payer: Medicare Other

## 2015-02-04 ENCOUNTER — Ambulatory Visit (HOSPITAL_BASED_OUTPATIENT_CLINIC_OR_DEPARTMENT_OTHER): Payer: Medicare Other

## 2015-02-04 ENCOUNTER — Encounter: Payer: Self-pay | Admitting: Internal Medicine

## 2015-02-04 ENCOUNTER — Ambulatory Visit (HOSPITAL_BASED_OUTPATIENT_CLINIC_OR_DEPARTMENT_OTHER): Payer: Medicare Other | Admitting: Hematology and Oncology

## 2015-02-04 VITALS — BP 149/70 | HR 56 | Temp 98.3°F | Resp 16 | Wt 140.0 lb

## 2015-02-04 DIAGNOSIS — D630 Anemia in neoplastic disease: Secondary | ICD-10-CM

## 2015-02-04 DIAGNOSIS — D701 Agranulocytosis secondary to cancer chemotherapy: Secondary | ICD-10-CM | POA: Diagnosis not present

## 2015-02-04 DIAGNOSIS — Z5112 Encounter for antineoplastic immunotherapy: Secondary | ICD-10-CM

## 2015-02-04 DIAGNOSIS — N186 End stage renal disease: Secondary | ICD-10-CM

## 2015-02-04 DIAGNOSIS — D702 Other drug-induced agranulocytosis: Secondary | ICD-10-CM

## 2015-02-04 DIAGNOSIS — C9000 Multiple myeloma not having achieved remission: Secondary | ICD-10-CM

## 2015-02-04 DIAGNOSIS — D61818 Other pancytopenia: Secondary | ICD-10-CM

## 2015-02-04 LAB — CBC WITH DIFFERENTIAL/PLATELET
BASO%: 0 % (ref 0.0–2.0)
Basophils Absolute: 0 10*3/uL (ref 0.0–0.1)
EOS%: 1.7 % (ref 0.0–7.0)
Eosinophils Absolute: 0 10*3/uL (ref 0.0–0.5)
HCT: 34.4 % — ABNORMAL LOW (ref 34.8–46.6)
HGB: 10.7 g/dL — ABNORMAL LOW (ref 11.6–15.9)
LYMPH%: 30 % (ref 14.0–49.7)
MCH: 31.8 pg (ref 25.1–34.0)
MCHC: 31.1 g/dL — ABNORMAL LOW (ref 31.5–36.0)
MCV: 102.1 fL — ABNORMAL HIGH (ref 79.5–101.0)
MONO#: 0.4 10*3/uL (ref 0.1–0.9)
MONO%: 17.9 % — ABNORMAL HIGH (ref 0.0–14.0)
NEUT#: 1.2 10*3/uL — ABNORMAL LOW (ref 1.5–6.5)
NEUT%: 50.4 % (ref 38.4–76.8)
Platelets: 152 10*3/uL (ref 145–400)
RBC: 3.37 10*6/uL — ABNORMAL LOW (ref 3.70–5.45)
RDW: 17.3 % — ABNORMAL HIGH (ref 11.2–14.5)
WBC: 2.4 10*3/uL — ABNORMAL LOW (ref 3.9–10.3)
lymph#: 0.7 10*3/uL — ABNORMAL LOW (ref 0.9–3.3)

## 2015-02-04 LAB — COMPREHENSIVE METABOLIC PANEL (CC13)
ALK PHOS: 50 U/L (ref 40–150)
ALT: 7 U/L (ref 0–55)
ANION GAP: 14 meq/L — AB (ref 3–11)
AST: 13 U/L (ref 5–34)
Albumin: 3.1 g/dL — ABNORMAL LOW (ref 3.5–5.0)
BUN: 31.5 mg/dL — ABNORMAL HIGH (ref 7.0–26.0)
CALCIUM: 10.4 mg/dL (ref 8.4–10.4)
CO2: 23 mEq/L (ref 22–29)
Chloride: 97 mEq/L — ABNORMAL LOW (ref 98–109)
Creatinine: 9.1 mg/dL (ref 0.6–1.1)
EGFR: 4 mL/min/{1.73_m2} — ABNORMAL LOW (ref >90)
GLUCOSE: 107 mg/dL (ref 70–140)
POTASSIUM: 4.1 meq/L (ref 3.5–5.1)
SODIUM: 134 meq/L — AB (ref 136–145)
Total Bilirubin: 0.46 mg/dL (ref 0.20–1.20)
Total Protein: 7.8 g/dL (ref 6.4–8.3)

## 2015-02-04 MED ORDER — DIPHENHYDRAMINE HCL 25 MG PO CAPS
ORAL_CAPSULE | ORAL | Status: AC
  Filled 2015-02-04: qty 2

## 2015-02-04 MED ORDER — SODIUM CHLORIDE 0.9 % IV SOLN
15.5000 mg/kg | Freq: Once | INTRAVENOUS | Status: AC
  Administered 2015-02-04: 1000 mg via INTRAVENOUS
  Filled 2015-02-04: qty 50

## 2015-02-04 MED ORDER — ACETAMINOPHEN 325 MG PO TABS
650.0000 mg | ORAL_TABLET | Freq: Once | ORAL | Status: AC
  Administered 2015-02-04: 650 mg via ORAL

## 2015-02-04 MED ORDER — FILGRASTIM 480 MCG/1.6ML IJ SOLN
480.0000 ug | Freq: Once | INTRAMUSCULAR | Status: AC
  Administered 2015-02-04: 480 ug via SUBCUTANEOUS
  Filled 2015-02-04: qty 1.6

## 2015-02-04 MED ORDER — METHYLPREDNISOLONE SODIUM SUCC 125 MG IJ SOLR
INTRAMUSCULAR | Status: AC
  Filled 2015-02-04: qty 2

## 2015-02-04 MED ORDER — DIPHENHYDRAMINE HCL 25 MG PO CAPS
50.0000 mg | ORAL_CAPSULE | Freq: Once | ORAL | Status: AC
  Administered 2015-02-04: 50 mg via ORAL

## 2015-02-04 MED ORDER — DARATUMUMAB CHEMO INJECTION 400 MG/20ML: 16.0000 mg/kg | Freq: Once | INTRAVENOUS | Status: DC

## 2015-02-04 MED ORDER — SODIUM CHLORIDE 0.9 % IV SOLN
Freq: Once | INTRAVENOUS | Status: AC
  Administered 2015-02-04: 11:00:00 via INTRAVENOUS
  Filled 2015-02-04: qty 4

## 2015-02-04 MED ORDER — METHYLPREDNISOLONE SODIUM SUCC 125 MG IJ SOLR
125.0000 mg | Freq: Once | INTRAMUSCULAR | Status: AC
  Administered 2015-02-04: 125 mg via INTRAVENOUS

## 2015-02-04 MED ORDER — ACETAMINOPHEN 325 MG PO TABS
ORAL_TABLET | ORAL | Status: AC
  Filled 2015-02-04: qty 2

## 2015-02-04 MED ORDER — SODIUM CHLORIDE 0.9 % IV SOLN
Freq: Once | INTRAVENOUS | Status: AC
  Administered 2015-02-04: 11:00:00 via INTRAVENOUS

## 2015-02-04 NOTE — Progress Notes (Signed)
[name] OFFICE PROGRESS NOTE  No care team member to display  SUMMARY OF ONCOLOGIC HISTORY:   Multiple myeloma   03/30/2008 Initial Diagnosis Multiple myeloma   03/30/2008 - 05/01/2012 Chemotherapy Revlimid/Dex then Velcade (started on 07/13/2011 ending on 09/14/2011) without response.  Managed by Dr. Chauncey Fischer of [place], [place] [address]   07/24/2010 -  Chemotherapy Received zometa  intermittently over one year.  Creatinine was 1.0 on 07/24/2010. 1.7 on 08/10/2011.    05/01/2012 - 09/01/2012 Chemotherapy Starte Pomalidomide s/p 4 cycles. Referred to [facility] for consideration of ASCT.     10/08/2012 Bone Marrow Biopsy Normocellular marrow with residual/recurrently plasma cell myeloma, 60-70 % of overall marrow celluarity.  Flow: c/w plasma cell neoplasm, plasma cell infiltrate comprises 60-70 % of overall marrow cellularity; FISH: + for 1 q21/CKS1B gain; RB1 (13q14)   10/08/2012 Bone Marrow Biopsy Continued... Cytogenetics showed normal female karyotype.    10/29/2012 Tumor Marker High IgG (6019), high lamda free light chain (409.32) with low IgA, IgM and kappa free light chain.    04/21/2013 Imaging Skeletal survey.  No suspcious lytic or blastic lesions visualized.  Severe multilevel degenerative disc disease in the cervical spine.    04/29/2013 Imaging PeT. No discrete suspicious focus of FDG uptake identified.    05/20/2013 - 06/09/2013 Chemotherapy Started carfilxomib 39 mg on 08/25, 08/26, day 8 (06/02/13), day 9 on 06/03/2013 and on 06/09/2013.     12/03/2013 Treatment Plan Change Last office visit with Dr. Glenna Durand.  She decline further chemotherapy.   [name]    03/11/2014 - 03/17/2014 Hospital Admission Admitted for low hemoglobin. Dr. Burney Gauze consulted.  SPEP,  UPEP and Bone marrow biopsy obtained.    IgG 11,300. M-spike 7.52, kappa lamda ratio 0 with lambda free light chains totaling 1260.    03/12/2014 Imaging Skeletal survey. 1. Small lytic lesions within the skull and right scapula. 2. Degenerative changes in the spine and knees, right greater than left. 3. Cardiomegaly without pulmonary edema.   03/17/2014 Bone Marrow Biopsy Hypercellular bone marrow tih extensive involvment by plasma cell neoplasm (Plasma cells 85%). FISH: Presence of 2 copies of the IGH/FGFR3, ATM, CCND1/IGH, and p53 probes.  Loss of 13q34 probes in 40% of cells. gain of chromose 12 and either loss of chorm   03/25/2014 -  Chemotherapy Planning Kyprolis 15 mg/m2 plus dexamethasone weekly plus revlimid (93m) daily on 06/29.   06/14/2014 -  Chemotherapy Patient's last dose of Kyprolis was on 05/26/14. Since then chemo held due to neutropenia despite GCSF support. Revlimid also on hold as of 06/14/14   06/28/2014 -  Chemotherapy New cycle of Kyprolis starts today.   07/07/2014 Tumor Marker IgG 3960    07/23/2014 Tumor Marker IgG 3620   10/04/2014 Tumor Marker IgG 2230 with M spike down to 0.06   12/20/2014 Tumor Marker  repeat M spike show 1.9 g IgG kappa. IgG level 2360 and lambda light chains at 333   01/07/2015 -  Chemotherapy Her treatment is switched to palliative treatment with Daratumumab    INTERVAL HISTORY: Please see below for problem oriented charting. She is seen for further review prior to treatment. She had missed several doses of treatment recently due to sickness. Today, she feels fine. Denies recent infection.  REVIEW OF SYSTEMS:   Constitutional: Denies fevers, chills or abnormal weight loss Eyes: Denies blurriness of vision Ears, nose, mouth, throat, and face: Denies  mucositis or sore throat Respiratory: Denies cough, dyspnea or wheezes Cardiovascular: Denies palpitation, chest discomfort or lower extremity swelling Gastrointestinal:  Denies nausea, heartburn or change in bowel habits Skin: Denies abnormal skin rashes Lymphatics: Denies new lymphadenopathy or easy bruising Neurological:Denies  numbness, tingling or new weaknesses Behavioral/Psych: Mood is stable, no new changes  All other systems were reviewed with the patient and are negative.  I have reviewed the past medical history, past surgical history, social history and family history with the patient and they are unchanged from previous note.  ALLERGIES:  has No Known Allergies.  MEDICATIONS:  Current Outpatient Prescriptions  Medication Sig Dispense Refill  . dexamethasone (DECADRON) 4 MG tablet Take 3 tablets (12 mg) on days 1,8,15, and 22 of chemotherapy. (Patient not taking: Reported on 01/21/2015) 24 tablet 2  . furosemide (LASIX) 20 MG tablet Take 1 tablet (20 mg total) by mouth daily. PRN for leg edema 60 tablet 2  . hydrALAZINE (APRESOLINE) 100 MG tablet Take 50 mg by mouth 2 (two) times daily.     Marland Kitchen HYDROcodone-homatropine (HYCODAN) 5-1.5 MG/5ML syrup Take 5 mLs by mouth every 6 (six) hours as needed for cough. (Patient not taking: Reported on 01/21/2015) 120 mL 0  . isosorbide mononitrate (IMDUR) 60 MG 24 hr tablet Take 60 mg by mouth daily.    . Nutritional Supplements (FEEDING SUPPLEMENT, NEPRO CARB STEADY,) LIQD Take 237 mLs by mouth 2 (two) times daily between meals. (Patient not taking: Reported on 01/21/2015) 237 mL 60  . ondansetron (ZOFRAN ODT) 4 MG disintegrating tablet Take 1 tablet (4 mg total) by mouth every 8 (eight) hours as needed for nausea or vomiting. 12 tablet 0  . prochlorperazine (COMPAZINE) 10 MG tablet Take 1 tablet (10 mg total) by mouth every 6 (six) hours as needed (Nausea or vomiting). (Patient not taking: Reported on 01/21/2015) 30 tablet 1   No current facility-administered medications for this visit.   Facility-Administered Medications Ordered in Other Visits  Medication Dose Route Frequency Provider Last Rate Last Dose  . daratumumab (DARZALEX) 1,000 mg in sodium chloride 0.9 % 450 mL (2 mg/mL) chemo infusion  15.5 mg/kg (Treatment Plan Actual) Intravenous Once Heath Lark, MD      .  ondansetron (ZOFRAN) 8 mg in sodium chloride 0.9 % 50 mL IVPB   Intravenous Once Heath Lark, MD        PHYSICAL EXAMINATION: ECOG PERFORMANCE STATUS: 1 - Symptomatic but completely ambulatory GENERAL:alert, no distress and comfortable SKIN: skin color, texture, turgor are normal, no rashes or significant lesions EYES: normal, Conjunctiva are pink and non-injected, sclera clear ABDOMEN:abdomen soft, non-tender and normal bowel sounds Musculoskeletal:no cyanosis of digits and no clubbing  NEURO: alert & oriented x 3 with fluent speech, no focal motor/sensory deficits  LABORATORY DATA:  I have reviewed the data as listed    Component Value Date/Time   NA 134* 02/04/2015 0945   NA 138 01/21/2015 0032   K 4.1 02/04/2015 0945   K 3.5 01/21/2015 0032   CL 97 01/21/2015 0032   CO2 23 02/04/2015 0945   CO2 31 01/21/2015 0032   GLUCOSE 107 02/04/2015 0945   GLUCOSE 128* 01/21/2015 0032   BUN 31.5* 02/04/2015 0945   BUN 24* 01/21/2015 0032   CREATININE 9.1* 02/04/2015 0945   CREATININE 4.97* 01/21/2015 0032   CALCIUM 10.4 02/04/2015 0945   CALCIUM 8.8 01/21/2015 0032   PROT 7.8 02/04/2015 0945   PROT 8.4* 01/21/2015 0032   ALBUMIN 3.1* 02/04/2015 0945  ALBUMIN 3.4* 01/21/2015 0032   AST 13 02/04/2015 0945   AST 17 01/21/2015 0032   ALT 7 02/04/2015 0945   ALT 13 01/21/2015 0032   ALKPHOS 50 02/04/2015 0945   ALKPHOS 46 01/21/2015 0032   BILITOT 0.46 02/04/2015 0945   BILITOT 0.5 01/21/2015 0032   GFRNONAA 8* 01/21/2015 0032   GFRAA 9* 01/21/2015 0032    No results found for: SPEP, UPEP  Lab Results  Component Value Date   WBC 2.4* 02/04/2015   NEUTROABS 1.2* 02/04/2015   HGB 10.7* 02/04/2015   HCT 34.4* 02/04/2015   MCV 102.1* 02/04/2015   PLT 152 02/04/2015      Chemistry      Component Value Date/Time   NA 134* 02/04/2015 0945   NA 138 01/21/2015 0032   K 4.1 02/04/2015 0945   K 3.5 01/21/2015 0032   CL 97 01/21/2015 0032   CO2 23 02/04/2015 0945   CO2 31  01/21/2015 0032   BUN 31.5* 02/04/2015 0945   BUN 24* 01/21/2015 0032   CREATININE 9.1* 02/04/2015 0945   CREATININE 4.97* 01/21/2015 0032      Component Value Date/Time   CALCIUM 10.4 02/04/2015 0945   CALCIUM 8.8 01/21/2015 0032   ALKPHOS 50 02/04/2015 0945   ALKPHOS 46 01/21/2015 0032   AST 13 02/04/2015 0945   AST 17 01/21/2015 0032   ALT 7 02/04/2015 0945   ALT 13 01/21/2015 0032   BILITOT 0.46 02/04/2015 0945   BILITOT 0.5 01/21/2015 0032      ASSESSMENT & PLAN:  Multiple myeloma Her recent blood draw for serum protein electrophoresis were abnormally high. However, at the time of the left draw, she had missed several infusions and hence she had only received 1 dose by the time her light chain work redrawn last week. I recommend we continue on same treatment and redraw it again at the end of the month. I plan to see her back 4 weeks from now. If repeat serum protein electrophoresis is still abnormal, I would discontinue current treatment.   Anemia in neoplastic disease She is not symptomatic and does not require transfusion or erythropoietin stimulating agent right now.    Drug-induced neutropenia This is likely due to recent treatment. The patient denies recent history of fevers, cough, chills, diarrhea or dysuria. She is asymptomatic from the leukopenia. I will observe for now.  I will continue the chemotherapy at current dose without dosage adjustment.  If the leukopenia gets progressive worse in the future, I might have to delay her treatment or adjust the chemotherapy dose. She will get GCSF support on Fridays      ESRD (end stage renal disease) She will continue hemodialysis 3 times a week through the fistula on Tuesday, Thursday and Saturday.        Orders Placed This Encounter  Procedures  . Comprehensive metabolic panel    Standing Status: Future     Number of Occurrences:      Standing Expiration Date: 03/10/2016  . Lactate dehydrogenase     Standing Status: Future     Number of Occurrences:      Standing Expiration Date: 03/10/2016  . SPEP & IFE with QIG    Standing Status: Future     Number of Occurrences:      Standing Expiration Date: 03/10/2016  . Kappa/lambda light chains    Standing Status: Future     Number of Occurrences:      Standing Expiration Date: 03/10/2016  .  Beta 2 microglobulin, serum    Standing Status: Future     Number of Occurrences:      Standing Expiration Date: 03/10/2016   All questions were answered. The patient knows to call the clinic with any problems, questions or concerns. No barriers to learning was detected. I spent 25 minutes counseling the patient face to face. The total time spent in the appointment was 30 minutes and more than 50% was on counseling and review of test results     [facility], Sanaii Caporaso, MD 02/04/2015 11:13 AM

## 2015-02-04 NOTE — Patient Instructions (Signed)
[facility] Discharge Instructions for Patients Receiving Chemotherapy  Today you received the following chemotherapy agents Daratumumab.  To help prevent nausea and vomiting after your treatment, we encourage you to take your nausea medication as prescribed.   If you develop nausea and vomiting that is not controlled by your nausea medication, call the clinic.   BELOW ARE SYMPTOMS THAT SHOULD BE REPORTED IMMEDIATELY:  *FEVER GREATER THAN 100.5 F  *CHILLS WITH OR WITHOUT FEVER  NAUSEA AND VOMITING THAT IS NOT CONTROLLED WITH YOUR NAUSEA MEDICATION  *UNUSUAL SHORTNESS OF BREATH  *UNUSUAL BRUISING OR BLEEDING  TENDERNESS IN MOUTH AND THROAT WITH OR WITHOUT PRESENCE OF ULCERS  *URINARY PROBLEMS  *BOWEL PROBLEMS  UNUSUAL RASH Items with * indicate a potential emergency and should be followed up as soon as possible.  Feel free to call the clinic you have any questions or concerns. The clinic phone number is [phone].  Please show the [facility] at check-in to the Emergency Department and triage nurse.

## 2015-02-04 NOTE — Assessment & Plan Note (Signed)
This is likely due to recent treatment. The patient denies recent history of fevers, cough, chills, diarrhea or dysuria. She is asymptomatic from the leukopenia. I will observe for now.  I will continue the chemotherapy at current dose without dosage adjustment.  If the leukopenia gets progressive worse in the future, I might have to delay her treatment or adjust the chemotherapy dose. She will get GCSF support on Fridays

## 2015-02-04 NOTE — Assessment & Plan Note (Signed)
She will continue hemodialysis 3 times a week through the fistula on Tuesday, Thursday and Saturday.

## 2015-02-04 NOTE — Assessment & Plan Note (Signed)
She is not symptomatic and does not require transfusion or erythropoietin stimulating agent right now.

## 2015-02-04 NOTE — Assessment & Plan Note (Signed)
Her recent blood draw for serum protein electrophoresis were abnormally high. However, at the time of the left draw, she had missed several infusions and hence she had only received 1 dose by the time her light chain work redrawn last week. I recommend we continue on same treatment and redraw it again at the end of the month. I plan to see her back 4 weeks from now. If repeat serum protein electrophoresis is still abnormal, I would discontinue current treatment.

## 2015-02-09 ENCOUNTER — Encounter (HOSPITAL_COMMUNITY): Payer: Self-pay | Admitting: Emergency Medicine

## 2015-02-09 ENCOUNTER — Emergency Department (HOSPITAL_COMMUNITY)
Admission: EM | Admit: 2015-02-09 | Discharge: 2015-02-10 | Disposition: A | Discharge status: Home / Self Care | Payer: Medicare Other | Attending: Emergency Medicine | Admitting: Emergency Medicine

## 2015-02-09 DIAGNOSIS — N186 End stage renal disease: Secondary | ICD-10-CM | POA: Insufficient documentation

## 2015-02-09 DIAGNOSIS — Z8739 Personal history of other diseases of the musculoskeletal system and connective tissue: Secondary | ICD-10-CM | POA: Insufficient documentation

## 2015-02-09 DIAGNOSIS — Z8579 Personal history of other malignant neoplasms of lymphoid, hematopoietic and related tissues: Secondary | ICD-10-CM | POA: Insufficient documentation

## 2015-02-09 DIAGNOSIS — Z862 Personal history of diseases of the blood and blood-forming organs and certain disorders involving the immune mechanism: Secondary | ICD-10-CM | POA: Insufficient documentation

## 2015-02-09 DIAGNOSIS — Z79899 Other long term (current) drug therapy: Secondary | ICD-10-CM | POA: Insufficient documentation

## 2015-02-09 DIAGNOSIS — Z8719 Personal history of other diseases of the digestive system: Secondary | ICD-10-CM | POA: Diagnosis not present

## 2015-02-09 DIAGNOSIS — I120 Hypertensive chronic kidney disease with stage 5 chronic kidney disease or end stage renal disease: Secondary | ICD-10-CM | POA: Diagnosis not present

## 2015-02-09 DIAGNOSIS — Z8639 Personal history of other endocrine, nutritional and metabolic disease: Secondary | ICD-10-CM | POA: Diagnosis not present

## 2015-02-09 DIAGNOSIS — R519 Headache, unspecified: Secondary | ICD-10-CM

## 2015-02-09 DIAGNOSIS — R51 Headache: Secondary | ICD-10-CM

## 2015-02-09 DIAGNOSIS — I10 Essential (primary) hypertension: Secondary | ICD-10-CM

## 2015-02-09 DIAGNOSIS — Z8701 Personal history of pneumonia (recurrent): Secondary | ICD-10-CM | POA: Insufficient documentation

## 2015-02-09 LAB — BASIC METABOLIC PANEL
Anion gap: 10 (ref 5–15)
BUN: 24 mg/dL — ABNORMAL HIGH (ref 6–20)
CO2: 28 mmol/L (ref 22–32)
Calcium: 9.6 mg/dL (ref 8.9–10.3)
Chloride: 93 mmol/L — ABNORMAL LOW (ref 101–111)
Creatinine, Ser: 6.43 mg/dL — ABNORMAL HIGH (ref 0.44–1.00)
GFR calc Af Amer: 7 mL/min — ABNORMAL LOW (ref >60)
GFR calc non Af Amer: 6 mL/min — ABNORMAL LOW (ref >60)
Glucose, Bld: 80 mg/dL (ref 70–99)
Potassium: 4.9 mmol/L (ref 3.5–5.1)
Sodium: 131 mmol/L — ABNORMAL LOW (ref 135–145)

## 2015-02-09 LAB — CBC WITH DIFFERENTIAL/PLATELET
Basophils Absolute: 0 10*3/uL (ref 0.0–0.1)
Basophils Relative: 0 % (ref 0–1)
Eosinophils Absolute: 0 10*3/uL (ref 0.0–0.7)
Eosinophils Relative: 1 % (ref 0–5)
HCT: 33.5 % — ABNORMAL LOW (ref 36.0–46.0)
Hemoglobin: 10.3 g/dL — ABNORMAL LOW (ref 12.0–15.0)
Lymphocytes Relative: 28 % (ref 12–46)
Lymphs Abs: 1 10*3/uL (ref 0.7–4.0)
MCH: 31.3 pg (ref 26.0–34.0)
MCHC: 30.7 g/dL (ref 30.0–36.0)
MCV: 101.8 fL — ABNORMAL HIGH (ref 78.0–100.0)
Monocytes Absolute: 0.3 10*3/uL (ref 0.1–1.0)
Monocytes Relative: 9 % (ref 3–12)
Neutro Abs: 2.2 10*3/uL (ref 1.7–7.7)
Neutrophils Relative %: 62 % (ref 43–77)
Platelets: 158 10*3/uL (ref 150–400)
RBC: 3.29 MIL/uL — ABNORMAL LOW (ref 3.87–5.11)
RDW: 17.7 % — ABNORMAL HIGH (ref 11.5–15.5)
WBC: 3.6 10*3/uL — ABNORMAL LOW (ref 4.0–10.5)

## 2015-02-09 LAB — SEDIMENTATION RATE: Sed Rate: 35 mm/hr — ABNORMAL HIGH (ref 0–22)

## 2015-02-09 MED ORDER — HYDRALAZINE HCL 100 MG PO TABS: 50.0000 mg | ORAL_TABLET | Freq: Four times a day (QID) | ORAL | Status: DC

## 2015-02-09 MED ORDER — LABETALOL HCL 5 MG/ML IV SOLN
20.0000 mg | Freq: Once | INTRAVENOUS | Status: AC
  Administered 2015-02-09: 20 mg via INTRAVENOUS
  Filled 2015-02-09: qty 4

## 2015-02-09 MED ORDER — METOCLOPRAMIDE HCL 5 MG/ML IJ SOLN
10.0000 mg | Freq: Once | INTRAMUSCULAR | Status: AC
  Administered 2015-02-09: 10 mg via INTRAVENOUS
  Filled 2015-02-09: qty 2

## 2015-02-09 MED ORDER — LORAZEPAM 2 MG/ML IJ SOLN
0.5000 mg | Freq: Once | INTRAMUSCULAR | Status: AC
  Administered 2015-02-09: 0.5 mg via INTRAVENOUS
  Filled 2015-02-09: qty 1

## 2015-02-09 MED ORDER — HYDRALAZINE HCL 20 MG/ML IJ SOLN
15.0000 mg | Freq: Once | INTRAMUSCULAR | Status: AC
  Administered 2015-02-09: 15 mg via INTRAVENOUS
  Filled 2015-02-09: qty 1

## 2015-02-09 MED ORDER — NITROGLYCERIN IN D5W 200-5 MCG/ML-% IV SOLN
0.0000 ug/min | Freq: Once | INTRAVENOUS | Status: DC
Start: 2015-02-09 — End: 2015-02-09

## 2015-02-09 MED ORDER — DIPHENHYDRAMINE HCL 50 MG/ML IJ SOLN
12.5000 mg | Freq: Once | INTRAMUSCULAR | Status: AC
  Administered 2015-02-09: 12.5 mg via INTRAVENOUS
  Filled 2015-02-09: qty 1

## 2015-02-09 NOTE — ED Provider Notes (Signed)
CSN: [id number]     Arrival date & time 02/09/15  1733 History   First MD Initiated Contact with Patient 02/09/15 1744     Chief Complaint  Patient presents with  . Hypertension  . Headache     (Consider location/radiation/quality/duration/timing/severity/associated sxs/prior Treatment) HPI   74 year old female with headache. Onset earlier today while at yesterday Gradual onset. Has been persistent since. Headache is diffuse and throbbing. No appreciable exacerbating relieving factors. No change in visual acuity or other eye complaints. No fevers or chills. Denies trauma. Patient concerned about her blood pressure. Reports compliance to medication.    Past Medical History  Diagnosis Date  . Renal disorder   . ESRD (end stage renal disease)   . Hypertension   . Multiple myeloma 2009  . Thyroid disease   . Hyperparathyroidism   . Thrombocytopenia   . Anemia   . Shortness of breath   . Pneumonia   . GERD (gastroesophageal reflux disease)   . Arthritis     rt knee is stiff  . Multiple myeloma    Past Surgical History  Procedure Laterality Date  . Abdominal hysterectomy      Pt. denies   Family History  Problem Relation Age of Onset  . Hypertension Mother   . Hypertension Father    History  Substance Use Topics  . Smoking status: Never Smoker   . Smokeless tobacco: Never Used  . Alcohol Use: No   OB History    No data available     Review of Systems  All systems reviewed and negative, other than as noted in HPI.   Allergies  Review of patient's allergies indicates no known allergies.  Home Medications   Prior to Admission medications   Medication Sig Start Date End Date Taking? Authorizing Provider  dexamethasone (DECADRON) 4 MG tablet Take 3 tablets (12 mg) on days 1,8,15, and 22 of chemotherapy. Patient not taking: Reported on 01/21/2015 06/14/14   Bernadene Bell, MD  furosemide (LASIX) 20 MG tablet Take 1 tablet (20 mg total) by mouth daily. PRN for leg  edema 12/27/14   Heath Lark, MD  hydrALAZINE (APRESOLINE) 100 MG tablet Take 50 mg by mouth 2 (two) times daily.     Historical Provider, MD  HYDROcodone-homatropine (HYCODAN) 5-1.5 MG/5ML syrup Take 5 mLs by mouth every 6 (six) hours as needed for cough. Patient not taking: Reported on 01/21/2015 05/03/14   Carlton Adam, PA-C  isosorbide mononitrate (IMDUR) 60 MG 24 hr tablet Take 60 mg by mouth daily.    Historical Provider, MD  Nutritional Supplements (FEEDING SUPPLEMENT, NEPRO CARB STEADY,) LIQD Take 237 mLs by mouth 2 (two) times daily between meals. Patient not taking: Reported on 01/21/2015 10/19/14   Karlene Einstein, MD  ondansetron (ZOFRAN ODT) 4 MG disintegrating tablet Take 1 tablet (4 mg total) by mouth every 8 (eight) hours as needed for nausea or vomiting. 01/21/15   Everlene Balls, MD  prochlorperazine (COMPAZINE) 10 MG tablet Take 1 tablet (10 mg total) by mouth every 6 (six) hours as needed (Nausea or vomiting). Patient not taking: Reported on 01/21/2015 03/25/14   Concha Norway, MD   BP 197/167 mmHg  Pulse 53  Temp(Src) 97.9 F (36.6 C)  Resp 15  Ht '5\' 7"'  (1.702 m)  SpO2 100% Physical Exam  Constitutional: She is oriented to person, place, and time. She appears well-developed and well-nourished. No distress.  HENT:  Head: Normocephalic and atraumatic.  Eyes: Conjunctivae and EOM are normal. Pupils  are equal, round, and reactive to light. Right eye exhibits no discharge. Left eye exhibits no discharge.  Neck: Normal range of motion. Neck supple.  No nuchal rigidity  Cardiovascular: Normal rate, regular rhythm and normal heart sounds.  Exam reveals no gallop and no friction rub.   No murmur heard. Pulmonary/Chest: Effort normal and breath sounds normal. No respiratory distress.  Abdominal: Soft. She exhibits no distension. There is no tenderness.  Musculoskeletal: She exhibits no edema or tenderness.  Neurological: She is alert and oriented to person, place, and time. No cranial  nerve deficit. She exhibits normal muscle tone. Coordination normal.  Speech clear. Content appropriate. Follows commands. Good finger to nose testing bilaterally. No focal motor deficit.  Skin: Skin is warm and dry.  Psychiatric: She has a normal mood and affect. Her behavior is normal. Thought content normal.  Nursing note and vitals reviewed.   ED Course  Procedures (including critical care time) Labs Review Labs Reviewed - No data to display  Imaging Review No results found.   EKG Interpretation None      MDM   Final diagnoses:  Nonintractable headache, unspecified chronicity pattern, unspecified headache type  Essential hypertension    74 year old female with headaches and hypertension. Sedimentation rate is somewhat elevated. Doubt temporal arteritis though. No change in visual acuity. No temporal tenderness. No jaw claudication. May need further evaluated if headache persists. Treated symptomatically with improvement of symptoms. Patient's persistent frustration with lack of PCP follow-up. [facility] be [facility]. Slightly hypertensive. Increased hydralazine to 3 times a day. Outpatient follow-up for further evaluation and medical management.  Virgel Manifold, MD 02/16/15 1315

## 2015-02-09 NOTE — ED Notes (Signed)
Pt here from home via EMS with c/o HA and HTN. Pt reports HA started after dialysis yesterday, pt finished treatment. Pt reports she has been taking BP meds, and that they are not working for her anymore. Pt also sts she is unable to secure care at PCP for changes to meds. Grips equal, a/o x 4.

## 2015-02-09 NOTE — ED Notes (Signed)
Pt tearful at this time, requesting to be referred to PCP.

## 2015-02-09 NOTE — ED Notes (Signed)
Pt remains groggy at this time, EDP aware of state of patient.

## 2015-02-09 NOTE — Discharge Instructions (Signed)
Headaches, Frequently Asked Questions MIGRAINE HEADACHES Q: What is migraine? What causes it? How can I treat it? A: Generally, migraine headaches begin as a dull ache. Then they develop into a constant, throbbing, and pulsating pain. You may experience pain at the temples. You may experience pain at the front or back of one or both sides of the head. The pain is usually accompanied by a combination of:  Nausea.  Vomiting.  Sensitivity to light and noise. Some people (about 15%) experience an aura (see below) before an attack. The cause of migraine is believed to be chemical reactions in the brain. Treatment for migraine may include over-the-counter or prescription medications. It may also include self-help techniques. These include relaxation training and biofeedback.  Q: What is an aura? A: About 15% of people with migraine get an "aura". This is a sign of neurological symptoms that occur before a migraine headache. You may see wavy or jagged lines, dots, or flashing lights. You might experience tunnel vision or blind spots in one or both eyes. The aura can include visual or auditory hallucinations (something imagined). It may include disruptions in smell (such as strange odors), taste or touch. Other symptoms include:  Numbness.  A "pins and needles" sensation.  Difficulty in recalling or speaking the correct word. These neurological events may last as long as 60 minutes. These symptoms will fade as the headache begins. Q: What is a trigger? A: Certain physical or environmental factors can lead to or "trigger" a migraine. These include:  Foods.  Hormonal changes.  Weather.  Stress. It is important to remember that triggers are different for everyone. To help prevent migraine attacks, you need to figure out which triggers affect you. Keep a headache diary. This is a good way to track triggers. The diary will help you talk to your healthcare professional about your condition. Q: Does  weather affect migraines? A: Bright sunshine, hot, humid conditions, and drastic changes in barometric pressure may lead to, or "trigger," a migraine attack in some people. But studies have shown that weather does not act as a trigger for everyone with migraines. Q: What is the link between migraine and hormones? A: Hormones start and regulate many of your body's functions. Hormones keep your body in balance within a constantly changing environment. The levels of hormones in your body are unbalanced at times. Examples are during menstruation, pregnancy, or menopause. That can lead to a migraine attack. In fact, about three quarters of all women with migraine report that their attacks are related to the menstrual cycle.  Q: Is there an increased risk of stroke for migraine sufferers? A: The likelihood of a migraine attack causing a stroke is very remote. That is not to say that migraine sufferers cannot have a stroke associated with their migraines. In persons under age 53, the most common associated factor for stroke is migraine headache. But over the course of a person's normal life span, the occurrence of migraine headache may actually be associated with a reduced risk of dying from cerebrovascular disease due to stroke.  Q: What are acute medications for migraine? A: Acute medications are used to treat the pain of the headache after it has started. Examples over-the-counter medications, NSAIDs, ergots, and triptans.  Q: What are the triptans? A: Triptans are the newest class of abortive medications. They are specifically targeted to treat migraine. Triptans are vasoconstrictors. They moderate some chemical reactions in the brain. The triptans work on receptors in your brain. Triptans help  to restore the balance of a neurotransmitter called serotonin. Fluctuations in levels of serotonin are thought to be a main cause of migraine.  °Q: Are over-the-counter medications for migraine effective? °A:  Over-the-counter, or "OTC," medications may be effective in relieving mild to moderate pain and associated symptoms of migraine. But you should see your caregiver before beginning any treatment regimen for migraine.  °Q: What are preventive medications for migraine? °A: Preventive medications for migraine are sometimes referred to as "prophylactic" treatments. They are used to reduce the frequency, severity, and length of migraine attacks. Examples of preventive medications include antiepileptic medications, antidepressants, beta-blockers, calcium channel blockers, and NSAIDs (nonsteroidal anti-inflammatory drugs). °Q: Why are anticonvulsants used to treat migraine? °A: During the past few years, there has been an increased interest in antiepileptic drugs for the prevention of migraine. They are sometimes referred to as "anticonvulsants". Both epilepsy and migraine may be caused by similar reactions in the brain.  °Q: Why are antidepressants used to treat migraine? °A: Antidepressants are typically used to treat people with depression. They may reduce migraine frequency by regulating chemical levels, such as serotonin, in the brain.  °Q: What alternative therapies are used to treat migraine? °A: The term "alternative therapies" is often used to describe treatments considered outside the scope of conventional Western medicine. Examples of alternative therapy include acupuncture, acupressure, and yoga. Another common alternative treatment is herbal therapy. Some herbs are believed to relieve headache pain. Always discuss alternative therapies with your caregiver before proceeding. Some herbal products contain arsenic and other toxins. °TENSION HEADACHES °Q: What is a tension-type headache? What causes it? How can I treat it? °A: Tension-type headaches occur randomly. They are often the result of temporary stress, anxiety, fatigue, or anger. Symptoms include soreness in your temples, a tightening band-like sensation  around your head (a "vice-like" ache). Symptoms can also include a pulling feeling, pressure sensations, and contracting head and neck muscles. The headache begins in your forehead, temples, or the back of your head and neck. Treatment for tension-type headache may include over-the-counter or prescription medications. Treatment may also include self-help techniques such as relaxation training and biofeedback. °CLUSTER HEADACHES °Q: What is a cluster headache? What causes it? How can I treat it? °A: Cluster headache gets its name because the attacks come in groups. The pain arrives with little, if any, warning. It is usually on one side of the head. A tearing or bloodshot eye and a runny nose on the same side of the headache may also accompany the pain. Cluster headaches are believed to be caused by chemical reactions in the brain. They have been described as the most severe and intense of any headache type. Treatment for cluster headache includes prescription medication and oxygen. °SINUS HEADACHES °Q: What is a sinus headache? What causes it? How can I treat it? °A: When a cavity in the bones of the face and skull (a sinus) becomes inflamed, the inflammation will cause localized pain. This condition is usually the result of an allergic reaction, a tumor, or an infection. If your headache is caused by a sinus blockage, such as an infection, you will probably have a fever. An x-ray will confirm a sinus blockage. Your caregiver's treatment might include antibiotics for the infection, as well as antihistamines or decongestants.  °REBOUND HEADACHES °Q: What is a rebound headache? What causes it? How can I treat it? °A: A pattern of taking acute headache medications too often can lead to a condition known as "rebound headache."   A pattern of taking too much headache medication includes taking it more than 2 days per week or in excessive amounts. That means more than the label or a caregiver advises. With rebound  headaches, your medications not only stop relieving pain, they actually begin to cause headaches. Doctors treat rebound headache by tapering the medication that is being overused. Sometimes your caregiver will gradually substitute a different type of treatment or medication. Stopping may be a challenge. Regularly overusing a medication increases the potential for serious side effects. Consult a caregiver if you regularly use headache medications more than 2 days per week or more than the label advises. °ADDITIONAL QUESTIONS AND ANSWERS °Q: What is biofeedback? °A: Biofeedback is a self-help treatment. Biofeedback uses special equipment to monitor your body's involuntary physical responses. Biofeedback monitors: °· Breathing. °· Pulse. °· Heart rate. °· Temperature. °· Muscle tension. °· Brain activity. °Biofeedback helps you refine and perfect your relaxation exercises. You learn to control the physical responses that are related to stress. Once the technique has been mastered, you do not need the equipment any more. °Q: Are headaches hereditary? °A: Four out of five (80%) of people that suffer report a family history of migraine. Scientists are not sure if this is genetic or a family predisposition. Despite the uncertainty, a child has a 50% chance of having migraine if one parent suffers. The child has a 75% chance if both parents suffer.  °Q: Can children get headaches? °A: By the time they reach high school, most young people have experienced some type of headache. Many safe and effective approaches or medications can prevent a headache from occurring or stop it after it has begun.  °Q: What type of doctor should I see to diagnose and treat my headache? °A: Start with your primary caregiver. Discuss his or her experience and approach to headaches. Discuss methods of classification, diagnosis, and treatment. Your caregiver may decide to recommend you to a headache specialist, depending upon your symptoms or other  physical conditions. Having diabetes, allergies, etc., may require a more comprehensive and inclusive approach to your headache. The National Headache Foundation will provide, upon request, a list of NHF physician members in your state. °Document Released: 12/08/2003 Document Revised: 12/10/2011 Document Reviewed: 05/17/2008 °ExitCare® Patient Information ©2015 [employer]. This information is not intended to replace advice given to you by your health care provider. Make sure you discuss any questions you have with your health care provider. ° °Hypertension °Hypertension, commonly called high blood pressure, is when the force of blood pumping through your arteries is too strong. Your arteries are the blood vessels that carry blood from your heart throughout your body. A blood pressure reading consists of a higher number over a lower number, such as 110/72. The higher number (systolic) is the pressure inside your arteries when your heart pumps. The lower number (diastolic) is the pressure inside your arteries when your heart relaxes. Ideally you want your blood pressure below 120/80. °Hypertension forces your heart to work harder to pump blood. Your arteries may become narrow or stiff. Having hypertension puts you at risk for heart disease, stroke, and other problems.  °RISK FACTORS °Some risk factors for high blood pressure are controllable. Others are not.  °Risk factors you cannot control include:  °· Race. You may be at higher risk if you are African American. °· Age. Risk increases with age. °· Gender. Men are at higher risk than women before age 45 years. After age 65, women are at   higher risk than men. Risk factors you can control include:  Not getting enough exercise or physical activity.  Being overweight.  Getting too much fat, sugar, calories, or salt in your diet.  Drinking too much alcohol. SIGNS AND SYMPTOMS Hypertension does not usually cause signs or symptoms. Extremely high blood  pressure (hypertensive crisis) may cause headache, anxiety, shortness of breath, and nosebleed. DIAGNOSIS  To check if you have hypertension, your health care provider will measure your blood pressure while you are seated, with your arm held at the level of your heart. It should be measured at least twice using the same arm. Certain conditions can cause a difference in blood pressure between your right and left arms. A blood pressure reading that is higher than normal on one occasion does not mean that you need treatment. If one blood pressure reading is high, ask your health care provider about having it checked again. TREATMENT  Treating high blood pressure includes making lifestyle changes and possibly taking medicine. Living a healthy lifestyle can help lower high blood pressure. You may need to change some of your habits. Lifestyle changes may include:  Following the DASH diet. This diet is high in fruits, vegetables, and whole grains. It is low in salt, red meat, and added sugars.  Getting at least 2 hours of brisk physical activity every week.  Losing weight if necessary.  Not smoking.  Limiting alcoholic beverages.  Learning ways to reduce stress. If lifestyle changes are not enough to get your blood pressure under control, your health care provider may prescribe medicine. You may need to take more than one. Work closely with your health care provider to understand the risks and benefits. HOME CARE INSTRUCTIONS  Have your blood pressure rechecked as directed by your health care provider.   Take medicines only as directed by your health care provider. Follow the directions carefully. Blood pressure medicines must be taken as prescribed. The medicine does not work as well when you skip doses. Skipping doses also puts you at risk for problems.   Do not smoke.   Monitor your blood pressure at home as directed by your health care provider. SEEK MEDICAL CARE IF:   You think you  are having a reaction to medicines taken.  You have recurrent headaches or feel dizzy.  You have swelling in your ankles.  You have trouble with your vision. SEEK IMMEDIATE MEDICAL CARE IF:  You develop a severe headache or confusion.  You have unusual weakness, numbness, or feel faint.  You have severe chest or abdominal pain.  You vomit repeatedly.  You have trouble breathing. MAKE SURE YOU:   Understand these instructions.  Will watch your condition.  Will get help right away if you are not doing well or get worse. Document Released: 09/17/2005 Document Revised: 02/01/2014 Document Reviewed: 07/10/2013 [facility] Patient Information 2015 [place], [place]. This information is not intended to replace advice given to you by your health care provider. Make sure you discuss any questions you have with your health care provider.   Emergency Department Resource Guide 1) Find a Doctor and Pay Out of Pocket Although you won't have to find out who is covered by your insurance plan, it is a good idea to ask around and get recommendations. You will then need to call the office and see if the doctor you have chosen will accept you as a new patient and what types of options they offer for patients who are self-pay. Some doctors offer discounts  or will set up payment plans for their patients who do not have insurance, but you will need to ask so you aren't surprised when you get to your appointment.  2) Contact Your Local Health Department Not all health departments have doctors that can see patients for sick visits, but many do, so it is worth a call to see if yours does. If you don't know where your local health department is, you can check in your phone book. The CDC also has a tool to help you locate your state's health department, and many state websites also have listings of all of their local health departments.  3) Find a [facility] If your illness is not likely to be very severe or  complicated, you may want to try a walk in clinic. These are popping up all over the country in pharmacies, drugstores, and shopping centers. They're usually staffed by nurse practitioners or physician assistants that have been trained to treat common illnesses and complaints. They're usually fairly quick and inexpensive. However, if you have serious medical issues or chronic medical problems, these are probably not your best option.  No Primary Care Doctor: - Call Health Connect at  [phone] - they can help you locate a primary care doctor that  accepts your insurance, provides certain services, etc. - Physician Referral Service- [phone]  Chronic Pain Problems: Organization         Address  Phone   Notes  [facility]  [phone] Patients need to be referred by their primary care doctor.   Medication Assistance: Organization         Address  Phone   Notes  [facility] Medication [facility] [address] [place], [place] [address] [phone] --Must be a resident of [facility] -- Must have NO insurance coverage whatsoever (no Medicaid/ Medicare, etc.) -- The pt. MUST have a primary care doctor that directs their care regularly and follows them in the community   [facility]  [phone]   [employer]  [phone]    Agencies that provide inexpensive medical care: Organization         Address  Phone   Notes  [place]  [phone]   [facility]    [phone]   [facility] [place], [place] [address] [phone]   [place] [address] [place] [phone]   Planned Parenthood    [phone]   [facility]    [phone]   [place] [address], [place] Phone:  [phone], Fax:  [phone] Hours of Operation:  9 am - 6 pm, M-F.  Also accepts  Medicaid/Medicare and self-pay.  [facility] for [place], [address], [place] Phone: [phone], Fax: [phone]. Hours of Operation:  8:30 am - 5:30 pm, M-F.  Also accepts Medicaid and self-pay.  [facility] [address], [place] Phone: [phone]   [place], [place], [place] [phone], Ext. [phone] Mondays & Thursdays: 7-9 AM.  First 15 patients are seen on a first come, first serve basis.    [place] Providers:  Organization         Address  Phone   Notes  [facility] [address], [place], [place] (  [phone]) [phone] Also accepts self-pay patients.  [facility] [id number] [place], [place]  [phone]   [place], [address], [place] [phone]   [facility] [address], [place] [phone]   Lucianne Lei [address], [place]   [phone] Only accepts [place] Access [place] patients after they have their name applied to their card.   Self-Pay (no insurance) in [facility]:  Organization         Address  Phone   Notes  Sickle Cell Patients, [facility] [place] [phone]   [facility] [place] [phone]   [facility] [place]  [place], [place], [place] [phone]   [facility]/Dr. Osei-Bonsu  [address], [place] or [address], [place] [phone] Phone number for both [place] and [place] locations is the same.  [facility] and [facility] [address], [place] [phone]   [facility] [address], [place] or [address] [phone]   [facility] [address], [place] [phone], phone; [phone], fax Sees patients 1st and 3rd Saturday of every month.  Must not qualify for public or private insurance (i.e. Medicaid, Medicare, [place] Health Choice, Veterans' Benefits)  Household income should be no more than 200% of the poverty level The clinic cannot treat you if you are pregnant or think you are pregnant  Sexually transmitted diseases are not treated at the clinic.    Dental Care: Organization         Address  Phone  Notes  [facility] Department of [facility] [place] [phone] Accepts children up to age 36 who are enrolled in [place] or [place]; pregnant women with a Medicaid card; and children who have applied for Medicaid or [place] Health Choice, but were declined, whose parents can pay a reduced fee at time of service.  [facility] Department of [facility]  [address], [place] [phone] Accepts children up to age 8 who are enrolled in [place] or [place]; pregnant women with a Medicaid card; and children who have applied for Medicaid or [place] Health Choice, but were declined, whose parents can pay a reduced fee at time of service.  [place] [facility]  [place] [phone] Patients are seen by appointment only. Walk-ins are not accepted. [place] will see patients 62 years of age and older. Monday - Tuesday (8am-5pm) Most Wednesdays (8:30-5pm) $30 per visit, cash only  [facility] Adult Dental Access PROGRAM  [address], [facility] [phone] Patients are seen by appointment only. Walk-ins are not accepted. [place] will see patients 29 years of age and older. One Wednesday Evening (Monthly: Volunteer Based).  $30 per visit, cash only  [place]  [phone] for adults; Children under age 3, call Graduate Pediatric Dentistry at [phone]. Children aged  22-14, please call [phone] to request a pediatric application.  Dental services are provided in all areas of dental care including fillings, crowns and bridges, complete and partial dentures, implants, gum treatment, root canals, and extractions. Preventive care is also  provided. Treatment is provided to both adults and children. Patients are selected via a lottery and there is often a waiting list.   [facility] [address] [place]  [phone] [email or web address]   [facility] [address] [place], [place] [phone], Ext. [phone] Second and Fourth Thursday of each month, opens at 6:30 AM; Clinic ends at 9 AM.  Patients are seen on a first-come first-served basis, and a limited number are seen during each clinic.   [facility]  [address] Hillard Danker [place], [place] [phone]   Eligibility Requirements You must have lived in [place], [place], or [place] counties for at least the last three months.   You cannot be eligible for state or federal sponsored [employer], including [employer], [place], or [employer].   You generally cannot be eligible for healthcare insurance through your employer.    How to apply: Eligibility screenings are held every Tuesday and Wednesday afternoon from 1:00 pm until 4:00 pm. You do not need an appointment for the interview!  [facility] [address] [place], [place]   [place]  [place] Department  [place]  [phone]    Behavioral Health Resources in the Community: Intensive Outpatient Programs Organization         Address  Phone  Notes  [place]. [address], [place], [place] [phone]   [facility] Outpatient [address], [place], [place]   ADS: Alcohol [facility] Drug [facility] [address],  [place], [place]   [place] [address]  [place], [place] or [phone]   Substance Abuse Resources Organization         Address  Phone  Notes  Alcohol and Drug Services  [phone]   [place]  [phone]   [facility] [place]   Chinita Pester  [phone]   Residential & Outpatient Substance Abuse Program  [phone]   Psychological Services Organization         Address  Phone  Notes  [facility]  [place]  [phone]   [place] [address] [place] or [phone]    Mobile Crisis Teams Organization         Address  Phone  Notes  [facility], Mobile Crisis Care Unit  [phone]   [facility]  [address] [place], [place]   [place] [address], [place] [phone]    Self-Help/Support Groups Organization         Address  Phone             Notes  Amanda. of [place] - variety of support groups  [place] Call for more information  Narcotics Anonymous (NA), [facility] [address], [employer] [place]  2 meetings at this location   Special educational needs teacher         Address  Phone  Notes  [facility] [place],    [place]  [phone]   [facility]  [address] [place] [id number], [place], [place]   [place], [place] [phone] Admissions: 8am-3pm M-F  Incentives Substance [place] [address]    [place], [place]   [facility] [address] Jadene Pierini [place], [place]   [facility] [place]  [place], [place]   [facility] - Intensive Outpatient [address], [place], [place]   [facility]  ([place] [address]  [place], [place] [phone] or [phone]   [facility] [address], [place], [place] Accepts Medicaid  [facility] [place] [address],  [place] [phone] Substance Abuse/Addiction Treatment   [facility]         Address  Phone  Notes  [facility]  [phone]   Domenic Schwab, PhD [address] Arlis [place], [place]   [phone] or [phone]   [place], [place] [phone]   [facility] [address] [place], [place] [phone] Insurance/Medicaid/sponsorship through [facility] and Families [address] [place]                                    [place], [place] [phone] [place] [address], [place] [phone]    Dr. Adele Schilder  [phone]   [facility] 1) [address], [place] 2) [place] 3)  [address], [place] [phone]  [phone]   [place] [phone] or [phone] (After Hours)

## 2015-02-09 NOTE — ED Notes (Signed)
MD at bedside. 

## 2015-02-10 NOTE — ED Notes (Signed)
Pt stable, ambulatory, states understanding of discharge instructions, reports will call cab for ride home

## 2015-02-11 ENCOUNTER — Ambulatory Visit (HOSPITAL_BASED_OUTPATIENT_CLINIC_OR_DEPARTMENT_OTHER): Payer: Medicare Other

## 2015-02-11 ENCOUNTER — Other Ambulatory Visit: Payer: Self-pay | Admitting: Hematology and Oncology

## 2015-02-11 ENCOUNTER — Other Ambulatory Visit (HOSPITAL_BASED_OUTPATIENT_CLINIC_OR_DEPARTMENT_OTHER): Payer: Medicare Other

## 2015-02-11 VITALS — BP 159/82 | HR 58 | Temp 97.6°F | Resp 18

## 2015-02-11 DIAGNOSIS — Z5112 Encounter for antineoplastic immunotherapy: Secondary | ICD-10-CM

## 2015-02-11 DIAGNOSIS — D701 Agranulocytosis secondary to cancer chemotherapy: Secondary | ICD-10-CM | POA: Diagnosis not present

## 2015-02-11 DIAGNOSIS — C9000 Multiple myeloma not having achieved remission: Secondary | ICD-10-CM | POA: Diagnosis not present

## 2015-02-11 DIAGNOSIS — D630 Anemia in neoplastic disease: Secondary | ICD-10-CM | POA: Diagnosis not present

## 2015-02-11 DIAGNOSIS — G4700 Insomnia, unspecified: Secondary | ICD-10-CM

## 2015-02-11 LAB — CBC WITH DIFFERENTIAL/PLATELET
BASO%: 0.4 % (ref 0.0–2.0)
Basophils Absolute: 0 10*3/uL (ref 0.0–0.1)
EOS ABS: 0 10*3/uL (ref 0.0–0.5)
EOS%: 1.5 % (ref 0.0–7.0)
HCT: 33.7 % — ABNORMAL LOW (ref 34.8–46.6)
HGB: 10.4 g/dL — ABNORMAL LOW (ref 11.6–15.9)
LYMPH%: 31.1 % (ref 14.0–49.7)
MCH: 31.5 pg (ref 25.1–34.0)
MCHC: 30.9 g/dL — ABNORMAL LOW (ref 31.5–36.0)
MCV: 102.1 fL — ABNORMAL HIGH (ref 79.5–101.0)
MONO#: 0.3 10*3/uL (ref 0.1–0.9)
MONO%: 12.7 % (ref 0.0–14.0)
NEUT#: 1.5 10*3/uL (ref 1.5–6.5)
NEUT%: 54.3 % (ref 38.4–76.8)
NRBC: 0 % (ref 0–0)
Platelets: 176 10*3/uL (ref 145–400)
RBC: 3.3 10*6/uL — ABNORMAL LOW (ref 3.70–5.45)
RDW: 17.5 % — AB (ref 11.2–14.5)
WBC: 2.7 10*3/uL — AB (ref 3.9–10.3)
lymph#: 0.8 10*3/uL — ABNORMAL LOW (ref 0.9–3.3)

## 2015-02-11 MED ORDER — DIPHENHYDRAMINE HCL 25 MG PO CAPS
50.0000 mg | ORAL_CAPSULE | Freq: Once | ORAL | Status: AC
  Administered 2015-02-11: 50 mg via ORAL

## 2015-02-11 MED ORDER — SODIUM CHLORIDE 0.9 % IV SOLN
15.5000 mg/kg | Freq: Once | INTRAVENOUS | Status: AC
  Administered 2015-02-11: 1000 mg via INTRAVENOUS
  Filled 2015-02-11: qty 50

## 2015-02-11 MED ORDER — ACETAMINOPHEN 325 MG PO TABS
ORAL_TABLET | ORAL | Status: AC
  Filled 2015-02-11: qty 2

## 2015-02-11 MED ORDER — ZOLPIDEM TARTRATE 5 MG PO TABS: 5.0000 mg | ORAL_TABLET | Freq: Every evening | ORAL | Status: DC | PRN

## 2015-02-11 MED ORDER — DIPHENHYDRAMINE HCL 25 MG PO CAPS
ORAL_CAPSULE | ORAL | Status: AC
  Filled 2015-02-11: qty 2

## 2015-02-11 MED ORDER — METHYLPREDNISOLONE SODIUM SUCC 125 MG IJ SOLR
125.0000 mg | Freq: Once | INTRAMUSCULAR | Status: AC
  Administered 2015-02-11: 125 mg via INTRAVENOUS

## 2015-02-11 MED ORDER — SODIUM CHLORIDE 0.9 % IV SOLN
Freq: Once | INTRAVENOUS | Status: AC
  Administered 2015-02-11: 11:00:00 via INTRAVENOUS

## 2015-02-11 MED ORDER — METHYLPREDNISOLONE SODIUM SUCC 125 MG IJ SOLR
INTRAMUSCULAR | Status: AC
  Filled 2015-02-11: qty 2

## 2015-02-11 MED ORDER — ACETAMINOPHEN 325 MG PO TABS
650.0000 mg | ORAL_TABLET | Freq: Once | ORAL | Status: AC
  Administered 2015-02-11: 650 mg via ORAL

## 2015-02-11 MED ORDER — SODIUM CHLORIDE 0.9 % IV SOLN
Freq: Once | INTRAVENOUS | Status: AC
  Administered 2015-02-11: 11:00:00 via INTRAVENOUS
  Filled 2015-02-11: qty 4

## 2015-02-11 NOTE — Patient Instructions (Signed)
[facility] Discharge Instructions for Patients   Today you received the following: Daratumumab.   To help prevent nausea and vomiting after your treatment, we encourage you to take your nausea medication as directed.    If you develop nausea and vomiting that is not controlled by your nausea medication, call the clinic.   BELOW ARE SYMPTOMS THAT SHOULD BE REPORTED IMMEDIATELY:  *FEVER GREATER THAN 100.5 F  *CHILLS WITH OR WITHOUT FEVER  NAUSEA AND VOMITING THAT IS NOT CONTROLLED WITH YOUR NAUSEA MEDICATION  *UNUSUAL SHORTNESS OF BREATH  *UNUSUAL BRUISING OR BLEEDING  TENDERNESS IN MOUTH AND THROAT WITH OR WITHOUT PRESENCE OF ULCERS  *URINARY PROBLEMS  *BOWEL PROBLEMS  UNUSUAL RASH Items with * indicate a potential emergency and should be followed up as soon as possible.  Feel free to call the clinic you have any questions or concerns. The clinic phone number is [phone].  Please show the [place] at check-in to the Emergency Department and triage nurse.

## 2015-02-11 NOTE — Progress Notes (Signed)
Okay to treat today with WBC: 2.7 and ANC: 1.5 per [name].   Pt tearful today. Reports difficulty sleeping, swelling to bilateral feet causing difficulty walking.  Pt states she went to the ED on 5/11 for headache and elevated BP and missed her dialysis appointment on Thursday 5/12.  [name] notified and at chairside to assess patient.  Okay to proceed with treatment, rx given to patient for Ambien.

## 2015-02-14 ENCOUNTER — Encounter: Payer: Self-pay | Admitting: *Deleted

## 2015-02-14 ENCOUNTER — Telehealth: Payer: Self-pay

## 2015-02-14 NOTE — Telephone Encounter (Signed)
Prior authorization request for zolpidem from [facility] forwarded to Raquel

## 2015-02-14 NOTE — Progress Notes (Signed)
RECEIVED A FAX FROM [facility] CONCERNING A PRIOR AUTHORIZATION FOR ZOLPIDEM. THIS REQUEST WAS GIVEN TO MANAGED CARE.

## 2015-02-15 ENCOUNTER — Encounter: Payer: Self-pay | Admitting: Hematology and Oncology

## 2015-02-15 NOTE — Progress Notes (Signed)
I sent prior auth req to [facility] for zolpidem

## 2015-02-16 ENCOUNTER — Ambulatory Visit: Payer: Medicare Other | Attending: Family Medicine | Admitting: Family Medicine

## 2015-02-16 ENCOUNTER — Ambulatory Visit: Payer: Medicare Other

## 2015-02-16 ENCOUNTER — Encounter: Payer: Self-pay | Admitting: Family Medicine

## 2015-02-16 VITALS — BP 159/81 | HR 62 | Temp 97.6°F | Resp 18 | Ht 67.0 in | Wt 136.0 lb

## 2015-02-16 DIAGNOSIS — I10 Essential (primary) hypertension: Secondary | ICD-10-CM | POA: Diagnosis not present

## 2015-02-16 DIAGNOSIS — N186 End stage renal disease: Secondary | ICD-10-CM | POA: Diagnosis not present

## 2015-02-16 DIAGNOSIS — C9000 Multiple myeloma not having achieved remission: Secondary | ICD-10-CM | POA: Diagnosis not present

## 2015-02-16 MED ORDER — PANTOPRAZOLE SODIUM 40 MG PO TBEC: 40.0000 mg | DELAYED_RELEASE_TABLET | Freq: Every day | ORAL | Status: DC

## 2015-02-16 NOTE — Progress Notes (Signed)
Patient here to follow up high BP. Patient went to ED last week with high BP. Patient has headache on right side, at level 2. Patient thinks BP med is old.

## 2015-02-16 NOTE — Progress Notes (Addendum)
Subjective:    Patient ID: [name], female    DOB: April 26, 1941, 75 y.o.   MRN: [id number]  HPI  [name] is a 74 year old female with a history of end-stage renal disease currently on hemodialysis Tuesday Thursday and Saturday, multiple myeloma currently on Daratumumab and Neupogen here for follow-up on her high blood pressure.  She has presented to [facility] ED on 02/09/15 with elevated blood pressure of 197/167 and headaches despite compliance with antihypertensives.She has checked her BP at home which was elevated and so she called EMS. EKG revealed NSR with a HR of 54 bpm, abnormal R wave progression As per patient she was told to reduce her Hydralazine from 116m tid to 595mqid which she has been doing.  Next hemodialysis session is tomorrow and she states she has been making more urine; she admits to a mild headache which is 2/10. She continues to have epigastric discomfort and burping a lot with minimal relief with Gas-x.  Past Medical History  Diagnosis Date  . Renal disorder   . ESRD (end stage renal disease)   . Hypertension   . Multiple myeloma 2009  . Thyroid disease   . Hyperparathyroidism   . Thrombocytopenia   . Anemia   . Shortness of breath   . Pneumonia   . GERD (gastroesophageal reflux disease)   . Arthritis     rt knee is stiff  . Multiple myeloma     Past Surgical History  Procedure Laterality Date  . Abdominal hysterectomy      Pt. denies    History   Social History  . Marital Status: Married    Spouse Name: N/A  . Number of Children: 3  . Years of Education: N/A   Occupational History  . 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   Social History Main Topics  . Smoking status: Never Smoker   . Smokeless tobacco: Never Used  . Alcohol Use: No  . Drug Use: No  . Sexual Activity: No   Other Topics Concern  . Not on file   Social History Narrative   [place]   [name]  [place] has brother in [place]    No Known Allergies  Current Outpatient Prescriptions on File Prior to Visit  Medication Sig Dispense Refill  . furosemide (LASIX) 20 MG tablet Take 1 tablet (20 mg total) by mouth daily. PRN for leg edema 60 tablet 2  . hydrALAZINE (APRESOLINE) 100 MG tablet Take 0.5 tablets (50 mg total) by mouth 4 (four) times daily. 60 tablet 0  . isosorbide mononitrate (IMDUR) 60 MG 24 hr tablet Take 60 mg by mouth daily.    . [place] Kitchenexamethasone (DECADRON) 4 MG tablet Take 3 tablets (12 mg) on days 1,8,15, and 22 of chemotherapy. (Patient not taking: Reported on 01/21/2015) 24 tablet 2  . HYDROcodone-homatropine (HYCODAN) 5-1.5 MG/5ML syrup Take 5 mLs by mouth every 6 (six) hours as needed for cough. (Patient not taking: Reported on 01/21/2015) 120 mL 0  . Nutritional Supplements (FEEDING SUPPLEMENT, NEPRO CARB STEADY,) LIQD Take 237 mLs by mouth 2 (two) times daily between meals. (Patient not taking: Reported on 01/21/2015) 237 mL 60  . ondansetron (ZOFRAN ODT) 4 MG disintegrating tablet Take 1 tablet (4 mg total) by mouth every 8 (eight) hours as needed for nausea or vomiting. (Patient not taking: Reported on 02/16/2015) 12 tablet 0  .  prochlorperazine (COMPAZINE) 10 MG tablet Take 1 tablet (10 mg total) by mouth every 6 (six) hours as needed (Nausea or vomiting). (Patient not taking: Reported on 01/21/2015) 30 tablet 1  . zolpidem (AMBIEN) 5 MG tablet Take 1 tablet (5 mg total) by mouth at bedtime as needed for sleep. (Patient not taking: Reported on 02/16/2015) 30 tablet 0   No current facility-administered medications on file prior to visit.     Review of Systems  Constitutional: Negative for activity change and appetite change.  Respiratory: Negative for cough, chest tightness, shortness of breath and wheezing.   Cardiovascular: Negative for chest pain and palpitations.  Gastrointestinal: Positive for abdominal distention. Negative for abdominal pain and  constipation.  Endocrine: Negative for polydipsia.  Genitourinary: Negative for dysuria and frequency.  Neurological: Positive for headaches. Negative for tremors, light-headedness and numbness.  Hematological: Does not bruise/bleed easily.  Psychiatric/Behavioral: Negative for behavioral problems and agitation.        Objective: Filed Vitals:   02/16/15 1221  BP: 159/81  Pulse: 62  Temp: 97.6 F (36.4 C)  TempSrc: Oral  Resp: 18  Height: 5' 7" (1.702 m)  Weight: 136 lb (61.689 kg)  SpO2: 96%      Physical Exam Constitutional: normal appearing,  Eyes: PERRLA HEENT: Head is atraumatic, normal sinuses, normal oropharynx, normal appearing tonsils and palate, tympanic membrane is normal bilaterally. Neck: normal range of motion, no thyromegaly, no JVD Cardiovascular: normal rate and rhythm, normal heart sounds, no murmurs, rub or gallop, 2+ pedal edema Respiratory: clear to auscultation bilaterally, no wheezes, no rales, no rhonchi Abdomen: soft, not tender to palpation, normal bowel sounds, no enlarged organs Extremities: Full ROM, no tenderness in joints Skin: warm and dry, no lesions. Neurological: alert, oriented x3, cranial nerves I-XII grossly intact , normal motor strength, normal sensation. Psychological: normal mood.       Assessment & Plan:  73 year old female with multiple myeloma, End stage renal disease (on Hemodialysis), hypertension here with concerns of elevated blood pressure.  Hypertension: Uncontrolled I have advised her to revert back to original dosing of Hydralazine of 100mg tid and she will need to be reassessed at her next office visit for the need for a third antihypertensive. Low sodium, DASH diet recommended.  Multiple myeloma: Management as per Oncology  End Stage Renal Disease: Continue Dialysis as per protocol.   

## 2015-02-16 NOTE — Patient Instructions (Signed)

## 2015-02-18 ENCOUNTER — Encounter: Payer: Self-pay | Admitting: Hematology and Oncology

## 2015-02-18 ENCOUNTER — Other Ambulatory Visit (HOSPITAL_BASED_OUTPATIENT_CLINIC_OR_DEPARTMENT_OTHER): Payer: Medicare Other

## 2015-02-18 ENCOUNTER — Encounter: Payer: Self-pay | Admitting: *Deleted

## 2015-02-18 ENCOUNTER — Ambulatory Visit (HOSPITAL_BASED_OUTPATIENT_CLINIC_OR_DEPARTMENT_OTHER): Payer: Medicare Other

## 2015-02-18 VITALS — BP 134/81 | HR 56 | Temp 97.9°F | Resp 18 | Wt 136.8 lb

## 2015-02-18 DIAGNOSIS — D630 Anemia in neoplastic disease: Secondary | ICD-10-CM | POA: Diagnosis not present

## 2015-02-18 DIAGNOSIS — Z5112 Encounter for antineoplastic immunotherapy: Secondary | ICD-10-CM

## 2015-02-18 DIAGNOSIS — C9000 Multiple myeloma not having achieved remission: Secondary | ICD-10-CM

## 2015-02-18 DIAGNOSIS — N186 End stage renal disease: Secondary | ICD-10-CM

## 2015-02-18 DIAGNOSIS — Z5189 Encounter for other specified aftercare: Secondary | ICD-10-CM

## 2015-02-18 DIAGNOSIS — D61818 Other pancytopenia: Secondary | ICD-10-CM

## 2015-02-18 LAB — CBC WITH DIFFERENTIAL/PLATELET
BASO%: 0.5 % (ref 0.0–2.0)
Basophils Absolute: 0 10*3/uL (ref 0.0–0.1)
EOS ABS: 0 10*3/uL (ref 0.0–0.5)
EOS%: 0.5 % (ref 0.0–7.0)
HCT: 35.6 % (ref 34.8–46.6)
HGB: 10.9 g/dL — ABNORMAL LOW (ref 11.6–15.9)
LYMPH#: 0.8 10*3/uL — AB (ref 0.9–3.3)
LYMPH%: 38.1 % (ref 14.0–49.7)
MCH: 31.3 pg (ref 25.1–34.0)
MCHC: 30.6 g/dL — AB (ref 31.5–36.0)
MCV: 102.3 fL — AB (ref 79.5–101.0)
MONO#: 0.4 10*3/uL (ref 0.1–0.9)
MONO%: 17.1 % — ABNORMAL HIGH (ref 0.0–14.0)
NEUT#: 0.9 10*3/uL — ABNORMAL LOW (ref 1.5–6.5)
NEUT%: 43.8 % (ref 38.4–76.8)
PLATELETS: 191 10*3/uL (ref 145–400)
RBC: 3.48 10*6/uL — AB (ref 3.70–5.45)
RDW: 16.9 % — ABNORMAL HIGH (ref 11.2–14.5)
WBC: 2.1 10*3/uL — AB (ref 3.9–10.3)

## 2015-02-18 MED ORDER — DARATUMUMAB CHEMO INJECTION 400 MG/20ML
15.5000 mg/kg | Freq: Once | INTRAVENOUS | Status: AC
  Administered 2015-02-18: 1000 mg via INTRAVENOUS
  Filled 2015-02-18: qty 50

## 2015-02-18 MED ORDER — DIPHENHYDRAMINE HCL 25 MG PO CAPS
50.0000 mg | ORAL_CAPSULE | Freq: Once | ORAL | Status: AC
  Administered 2015-02-18: 50 mg via ORAL

## 2015-02-18 MED ORDER — SODIUM CHLORIDE 0.9 % IV SOLN
Freq: Once | INTRAVENOUS | Status: AC
  Administered 2015-02-18: 11:00:00 via INTRAVENOUS

## 2015-02-18 MED ORDER — SODIUM CHLORIDE 0.9 % IV SOLN
Freq: Once | INTRAVENOUS | Status: AC
  Administered 2015-02-18: 11:00:00 via INTRAVENOUS
  Filled 2015-02-18: qty 4

## 2015-02-18 MED ORDER — ACETAMINOPHEN 325 MG PO TABS
650.0000 mg | ORAL_TABLET | Freq: Once | ORAL | Status: AC
Start: 2015-02-18 — End: 2015-02-18
  Administered 2015-02-18: 650 mg via ORAL

## 2015-02-18 MED ORDER — METHYLPREDNISOLONE SODIUM SUCC 125 MG IJ SOLR
125.0000 mg | Freq: Once | INTRAMUSCULAR | Status: AC
  Administered 2015-02-18: 125 mg via INTRAVENOUS

## 2015-02-18 MED ORDER — DIPHENHYDRAMINE HCL 25 MG PO CAPS
ORAL_CAPSULE | ORAL | Status: AC
  Filled 2015-02-18: qty 2

## 2015-02-18 MED ORDER — ACETAMINOPHEN 325 MG PO TABS
ORAL_TABLET | ORAL | Status: AC
  Filled 2015-02-18: qty 2

## 2015-02-18 MED ORDER — METHYLPREDNISOLONE SODIUM SUCC 125 MG IJ SOLR
INTRAMUSCULAR | Status: AC
  Filled 2015-02-18: qty 2

## 2015-02-18 MED ORDER — FILGRASTIM 480 MCG/0.8ML IJ SOSY
480.0000 ug | PREFILLED_SYRINGE | Freq: Once | INTRAMUSCULAR | Status: AC
  Administered 2015-02-18: 480 ug via SUBCUTANEOUS
  Filled 2015-02-18: qty 0.8

## 2015-02-18 NOTE — Progress Notes (Signed)
Ok to treat with WBC 2.1 and neutrophils 0.9 per Dr Alvy Bimler  Per Dr Alvy Bimler, pt to receive neupogen every Friday

## 2015-02-18 NOTE — Progress Notes (Signed)
Per Haniel zolpidem approved 10/01/14-10/01/15 [id number] approval#.

## 2015-02-18 NOTE — Progress Notes (Signed)
I left a message for [name] to advise the patient prior [name] was done.

## 2015-02-18 NOTE — Progress Notes (Signed)
RECEIVED A FAX FROM [facility] CONCERNING A PRIOR AUTHORIZATION FOR ZOLPIDEM. THIS REQUEST WAS PLACED IN THE MANAGED CARE BIN.

## 2015-02-18 NOTE — Progress Notes (Signed)
Kasie at [facility] said they denied because the patient's medicare part d should cover. I will send to them.

## 2015-02-18 NOTE — Patient Instructions (Signed)
[facility] Discharge Instructions for Patients Receiving Chemotherapy  Today you received the following chemotherapy agents daratumumab   To help prevent nausea and vomiting after your treatment, we encourage you to take your nausea medication as directed   If you develop nausea and vomiting that is not controlled by your nausea medication, call the clinic.   BELOW ARE SYMPTOMS THAT SHOULD BE REPORTED IMMEDIATELY:  *FEVER GREATER THAN 100.5 F  *CHILLS WITH OR WITHOUT FEVER  NAUSEA AND VOMITING THAT IS NOT CONTROLLED WITH YOUR NAUSEA MEDICATION  *UNUSUAL SHORTNESS OF BREATH  *UNUSUAL BRUISING OR BLEEDING  TENDERNESS IN MOUTH AND THROAT WITH OR WITHOUT PRESENCE OF ULCERS  *URINARY PROBLEMS  *BOWEL PROBLEMS  UNUSUAL RASH Items with * indicate a potential emergency and should be followed up as soon as possible.  Feel free to call the clinic you have any questions or concerns. The clinic phone number is [phone].

## 2015-02-23 ENCOUNTER — Ambulatory Visit: Payer: Medicare Other | Attending: Family Medicine | Admitting: Family Medicine

## 2015-02-23 ENCOUNTER — Encounter: Payer: Self-pay | Admitting: Family Medicine

## 2015-02-23 VITALS — BP 140/90 | HR 61 | Temp 99.3°F | Resp 16 | Ht 67.0 in | Wt 135.0 lb

## 2015-02-23 DIAGNOSIS — I120 Hypertensive chronic kidney disease with stage 5 chronic kidney disease or end stage renal disease: Secondary | ICD-10-CM

## 2015-02-23 DIAGNOSIS — G4700 Insomnia, unspecified: Secondary | ICD-10-CM | POA: Diagnosis not present

## 2015-02-23 DIAGNOSIS — Z992 Dependence on renal dialysis: Secondary | ICD-10-CM

## 2015-02-23 DIAGNOSIS — R609 Edema, unspecified: Secondary | ICD-10-CM

## 2015-02-23 DIAGNOSIS — N186 End stage renal disease: Secondary | ICD-10-CM

## 2015-02-23 MED ORDER — HYDRALAZINE HCL 100 MG PO TABS: 100.0000 mg | ORAL_TABLET | Freq: Three times a day (TID) | ORAL | Status: DC

## 2015-02-23 MED ORDER — TRAZODONE HCL 50 MG PO TABS: 25.0000 mg | ORAL_TABLET | Freq: Every evening | ORAL | Status: DC | PRN

## 2015-02-23 MED ORDER — ISOSORBIDE MONONITRATE ER 60 MG PO TB24: 60.0000 mg | ORAL_TABLET | Freq: Every day | ORAL | Status: DC

## 2015-02-23 MED ORDER — AMLODIPINE BESYLATE 5 MG PO TABS: 5.0000 mg | ORAL_TABLET | Freq: Every day | ORAL | Status: DC

## 2015-02-23 MED ORDER — FUROSEMIDE 20 MG PO TABS: 20.0000 mg | ORAL_TABLET | Freq: Every day | ORAL | Status: DC

## 2015-02-23 NOTE — Progress Notes (Signed)
Stated not feeling good Took sleeping medication and did not work Unable to sleep for a long time

## 2015-02-23 NOTE — Progress Notes (Signed)
   Subjective:    Patient ID: [name], female    DOB: 1941-01-11, 74 y.o.   MRN: [id number] CC: f/u HTN, insomnia  HPI  74 yo F with HTN, ESRD-DD, multiple myeloma  1. CHRONIC HYPERTENSION  Disease Monitoring  Blood pressure range: not checking   Chest pain: no   Dyspnea: no   Claudication: no   Medication compliance: yes  Medication Side Effects  Lightheadedness: no   Urinary frequency: no   Edema: no     2. Insomnia: for over 1 year. Poor sleep. [name] for first time last night. Experienced dyspepsia. Is distressed about her medical problems. Lives alone but has a lot of family in town. Feels run down and fatigue. Denies pain. Has tried not other sleep aids.   Soc Hx: chronic non smoker  Review of Systems  Constitutional: Negative for chills.  Respiratory: Negative for shortness of breath.   Cardiovascular: Negative for chest pain.  Musculoskeletal: Negative for myalgias, back pain and neck pain.  Psychiatric/Behavioral: Positive for sleep disturbance.       Objective:   Physical Exam BP 140/90 mmHg  Pulse 61  Temp(Src) 99.3 F (37.4 C) (Oral)  Resp 16  Ht _0  (1.702 m)  Wt 135 lb (61.236 kg)  BMI 21.14 kg/m2  SpO2 96%  BP Readings from Last 3 Encounters:  02/23/15 140/90  02/18/15 134/81  02/16/15 159/81   General appearance: alert, cooperative and no distress Lungs: clear to auscultation bilaterally Heart: regular rate and rhythm, S1, S2 normal, no murmur, click, rub or gallop Extremities: extremities normal, atraumatic, no cyanosis or edema      Assessment & Plan:

## 2015-02-23 NOTE — Assessment & Plan Note (Signed)
HTN:  Goal will be < 130/90  Start norvasc 5 mg daily Continue hydralazine 100 mg three times a day Continue imdur 60 mh once daily  Continue lasix as needed

## 2015-02-23 NOTE — Assessment & Plan Note (Signed)
Insomnia: Put zolpidem aside and use as needed Take trazodone 25-50 mg 30 minutes before bed

## 2015-02-23 NOTE — Patient Instructions (Addendum)
Ms. Orloski,  Thank you for coming in today. It was a pleasure meeting you. I look forward to being your primary doctor.   1. HTN:  Goal will be < 130/90  Start norvasc 5 mg daily Continue hydralazine 100 mg three times a day Continue imdur 60 mh once daily  Continue lasix as needed  2. Insomnia: Put zolpidem aside and use as needed Take trazodone 25-50 mg 30 minutes before bed   F/u in 3 weeks with RN  For BP check F/u with me in 6 weeks for insomnia and HTN  Dr. Adrian Blackwater

## 2015-02-25 ENCOUNTER — Other Ambulatory Visit (HOSPITAL_BASED_OUTPATIENT_CLINIC_OR_DEPARTMENT_OTHER): Payer: Medicare Other

## 2015-02-25 ENCOUNTER — Ambulatory Visit (HOSPITAL_BASED_OUTPATIENT_CLINIC_OR_DEPARTMENT_OTHER): Payer: Medicare Other

## 2015-02-25 VITALS — BP 157/84 | HR 57 | Temp 98.3°F | Resp 20 | Wt 132.0 lb

## 2015-02-25 DIAGNOSIS — D630 Anemia in neoplastic disease: Secondary | ICD-10-CM

## 2015-02-25 DIAGNOSIS — D701 Agranulocytosis secondary to cancer chemotherapy: Secondary | ICD-10-CM

## 2015-02-25 DIAGNOSIS — C9000 Multiple myeloma not having achieved remission: Secondary | ICD-10-CM

## 2015-02-25 DIAGNOSIS — Z5112 Encounter for antineoplastic immunotherapy: Secondary | ICD-10-CM

## 2015-02-25 DIAGNOSIS — N186 End stage renal disease: Secondary | ICD-10-CM | POA: Diagnosis not present

## 2015-02-25 DIAGNOSIS — D61818 Other pancytopenia: Secondary | ICD-10-CM

## 2015-02-25 LAB — CBC WITH DIFFERENTIAL/PLATELET
BASO%: 0.7 % (ref 0.0–2.0)
Basophils Absolute: 0 10*3/uL (ref 0.0–0.1)
EOS%: 0.7 % (ref 0.0–7.0)
Eosinophils Absolute: 0 10*3/uL (ref 0.0–0.5)
HEMATOCRIT: 37.3 % (ref 34.8–46.6)
HGB: 11.6 g/dL (ref 11.6–15.9)
LYMPH%: 33 % (ref 14.0–49.7)
MCH: 31 pg (ref 25.1–34.0)
MCHC: 31.2 g/dL — ABNORMAL LOW (ref 31.5–36.0)
MCV: 99.1 fL (ref 79.5–101.0)
MONO#: 0.8 10*3/uL (ref 0.1–0.9)
MONO%: 26.9 % — AB (ref 0.0–14.0)
NEUT%: 38.7 % (ref 38.4–76.8)
NEUTROS ABS: 1.2 10*3/uL — AB (ref 1.5–6.5)
PLATELETS: 190 10*3/uL (ref 145–400)
RBC: 3.76 10*6/uL (ref 3.70–5.45)
RDW: 17 % — ABNORMAL HIGH (ref 11.2–14.5)
WBC: 3 10*3/uL — ABNORMAL LOW (ref 3.9–10.3)
lymph#: 1 10*3/uL (ref 0.9–3.3)

## 2015-02-25 LAB — LACTATE DEHYDROGENASE (CC13): LDH: 209 U/L (ref 125–245)

## 2015-02-25 LAB — COMPREHENSIVE METABOLIC PANEL (CC13)
ALK PHOS: 55 U/L (ref 40–150)
ALT: 10 U/L (ref 0–55)
ANION GAP: 8 meq/L (ref 3–11)
AST: 16 U/L (ref 5–34)
Albumin: 3.1 g/dL — ABNORMAL LOW (ref 3.5–5.0)
BUN: 22.3 mg/dL (ref 7.0–26.0)
CALCIUM: 9.8 mg/dL (ref 8.4–10.4)
CHLORIDE: 98 meq/L (ref 98–109)
CO2: 29 mEq/L (ref 22–29)
CREATININE: 5.6 mg/dL — AB (ref 0.6–1.1)
EGFR: 8 mL/min/{1.73_m2} — ABNORMAL LOW (ref >90)
Glucose: 86 mg/dl (ref 70–140)
Potassium: 4.2 mEq/L (ref 3.5–5.1)
Sodium: 135 mEq/L — ABNORMAL LOW (ref 136–145)
Total Bilirubin: 0.52 mg/dL (ref 0.20–1.20)
Total Protein: 8.6 g/dL — ABNORMAL HIGH (ref 6.4–8.3)

## 2015-02-25 MED ORDER — FILGRASTIM 480 MCG/0.8ML IJ SOSY
480.0000 ug | PREFILLED_SYRINGE | Freq: Once | INTRAMUSCULAR | Status: AC
  Administered 2015-02-25: 480 ug via SUBCUTANEOUS
  Filled 2015-02-25: qty 0.8

## 2015-02-25 MED ORDER — METHYLPREDNISOLONE SODIUM SUCC 125 MG IJ SOLR
125.0000 mg | Freq: Once | INTRAMUSCULAR | Status: AC
  Administered 2015-02-25: 125 mg via INTRAVENOUS

## 2015-02-25 MED ORDER — ACETAMINOPHEN 325 MG PO TABS
ORAL_TABLET | ORAL | Status: AC
  Filled 2015-02-25: qty 2

## 2015-02-25 MED ORDER — METHYLPREDNISOLONE SODIUM SUCC 125 MG IJ SOLR
INTRAMUSCULAR | Status: AC
  Filled 2015-02-25: qty 2

## 2015-02-25 MED ORDER — SODIUM CHLORIDE 0.9 % IV SOLN
Freq: Once | INTRAVENOUS | Status: AC
  Administered 2015-02-25: 11:00:00 via INTRAVENOUS
  Filled 2015-02-25: qty 4

## 2015-02-25 MED ORDER — ACETAMINOPHEN 325 MG PO TABS
650.0000 mg | ORAL_TABLET | Freq: Once | ORAL | Status: AC
  Administered 2015-02-25: 650 mg via ORAL

## 2015-02-25 MED ORDER — SODIUM CHLORIDE 0.9 % IV SOLN
15.5000 mg/kg | Freq: Once | INTRAVENOUS | Status: AC
  Administered 2015-02-25: 1000 mg via INTRAVENOUS
  Filled 2015-02-25: qty 50

## 2015-02-25 MED ORDER — SODIUM CHLORIDE 0.9 % IV SOLN
Freq: Once | INTRAVENOUS | Status: AC
  Administered 2015-02-25: 11:00:00 via INTRAVENOUS

## 2015-02-25 MED ORDER — DIPHENHYDRAMINE HCL 25 MG PO CAPS
ORAL_CAPSULE | ORAL | Status: AC
  Filled 2015-02-25: qty 2

## 2015-02-25 MED ORDER — DIPHENHYDRAMINE HCL 25 MG PO CAPS
50.0000 mg | ORAL_CAPSULE | Freq: Once | ORAL | Status: AC
  Administered 2015-02-25: 50 mg via ORAL

## 2015-02-25 NOTE — Patient Instructions (Addendum)
[facility] Discharge Instructions for Patients Receiving Chemotherapy  Today you received the following chemotherapy agents Daratumumab and neupogen  To help prevent nausea and vomiting after your treatment, we encourage you to take your nausea medication if needed.   If you develop nausea and vomiting that is not controlled by your nausea medication, call the clinic.   BELOW ARE SYMPTOMS THAT SHOULD BE REPORTED IMMEDIATELY:  *FEVER GREATER THAN 100.  *CHILLS WITH OR WITHOUT FEVER  NAUSEA AND VOMITING THAT IS NOT CONTROLLED WITH YOUR NAUSEA MEDICATION  *UNUSUAL SHORTNESS OF BREATH  *UNUSUAL BRUISING OR BLEEDING  TENDERNESS IN MOUTH AND THROAT WITH OR WITHOUT PRESENCE OF ULCERS  *URINARY PROBLEMS  *BOWEL PROBLEMS  UNUSUAL RASH Items with * indicate a potential emergency and should be followed up as soon as possible.  Feel free to call the clinic you have any questions or concerns. The clinic phone number is [phone].  Please show the [place] at check-in to the Emergency Department and triage nurse.

## 2015-02-25 NOTE — Progress Notes (Signed)
OK to treat w/ANC of 1.2 per Dr Alen Blew.

## 2015-03-01 LAB — BETA 2 MICROGLOBULIN, SERUM: BETA 2 MICROGLOBULIN: 47.1 mg/L — AB (ref <=2.51)

## 2015-03-02 LAB — SPEP & IFE WITH QIG
ABNORMAL PROTEIN BAND1: 2.8 g/dL
ALPHA-1-GLOBULIN: 0.3 g/dL (ref 0.2–0.3)
Albumin ELP: 4 g/dL (ref 3.8–4.8)
Alpha-2-Globulin: 0.6 g/dL (ref 0.5–0.9)
Beta 2: 0.2 g/dL (ref 0.2–0.5)
Beta Globulin: 0.3 g/dL — ABNORMAL LOW (ref 0.4–0.6)
Gamma Globulin: 3 g/dL — ABNORMAL HIGH (ref 0.8–1.7)
IgA: <6 mg/dL — ABNORMAL LOW (ref 69–380)
IgG (Immunoglobin G), Serum: 3320 mg/dL — ABNORMAL HIGH (ref 690–1700)
IgM, Serum: <5 mg/dL — ABNORMAL LOW (ref 52–322)
Total Protein, Serum Electrophoresis: 8.3 g/dL — ABNORMAL HIGH (ref 6.1–8.1)

## 2015-03-02 LAB — KAPPA/LAMBDA LIGHT CHAINS
KAPPA LAMBDA RATIO: 0 — AB (ref 0.26–1.65)
Kappa free light chain: 0.63 mg/dL (ref 0.33–1.94)
Lambda Free Lght Chn: 367 mg/dL — ABNORMAL HIGH (ref 0.57–2.63)

## 2015-03-04 ENCOUNTER — Encounter: Payer: Self-pay | Admitting: Hematology and Oncology

## 2015-03-04 ENCOUNTER — Other Ambulatory Visit (HOSPITAL_BASED_OUTPATIENT_CLINIC_OR_DEPARTMENT_OTHER): Payer: Medicare Other

## 2015-03-04 ENCOUNTER — Telehealth: Payer: Self-pay | Admitting: Hematology and Oncology

## 2015-03-04 ENCOUNTER — Ambulatory Visit (HOSPITAL_BASED_OUTPATIENT_CLINIC_OR_DEPARTMENT_OTHER): Payer: Medicare Other | Admitting: Hematology and Oncology

## 2015-03-04 ENCOUNTER — Other Ambulatory Visit: Payer: Self-pay

## 2015-03-04 ENCOUNTER — Ambulatory Visit: Payer: Medicare Other

## 2015-03-04 VITALS — BP 133/76 | HR 65 | Temp 98.0°F | Resp 18 | Ht 67.0 in | Wt 133.4 lb

## 2015-03-04 DIAGNOSIS — N186 End stage renal disease: Secondary | ICD-10-CM

## 2015-03-04 DIAGNOSIS — D702 Other drug-induced agranulocytosis: Secondary | ICD-10-CM

## 2015-03-04 DIAGNOSIS — D701 Agranulocytosis secondary to cancer chemotherapy: Secondary | ICD-10-CM | POA: Diagnosis not present

## 2015-03-04 DIAGNOSIS — C9000 Multiple myeloma not having achieved remission: Secondary | ICD-10-CM | POA: Diagnosis present

## 2015-03-04 DIAGNOSIS — D630 Anemia in neoplastic disease: Secondary | ICD-10-CM

## 2015-03-04 LAB — CBC WITH DIFFERENTIAL/PLATELET
BASO%: 0.7 % (ref 0.0–2.0)
Basophils Absolute: 0 10*3/uL (ref 0.0–0.1)
EOS%: 0.5 % (ref 0.0–7.0)
Eosinophils Absolute: 0 10*3/uL (ref 0.0–0.5)
HEMATOCRIT: 35.9 % (ref 34.8–46.6)
HGB: 11.5 g/dL — ABNORMAL LOW (ref 11.6–15.9)
LYMPH#: 0.8 10*3/uL — AB (ref 0.9–3.3)
LYMPH%: 19.5 % (ref 14.0–49.7)
MCH: 31.6 pg (ref 25.1–34.0)
MCHC: 31.9 g/dL (ref 31.5–36.0)
MCV: 99.1 fL (ref 79.5–101.0)
MONO#: 0.5 10*3/uL (ref 0.1–0.9)
MONO%: 12.9 % (ref 0.0–14.0)
NEUT#: 2.8 10*3/uL (ref 1.5–6.5)
NEUT%: 66.4 % (ref 38.4–76.8)
PLATELETS: 158 10*3/uL (ref 145–400)
RBC: 3.63 10*6/uL — AB (ref 3.70–5.45)
RDW: 17.2 % — ABNORMAL HIGH (ref 11.2–14.5)
WBC: 4.2 10*3/uL (ref 3.9–10.3)

## 2015-03-04 NOTE — Assessment & Plan Note (Signed)
She will continue hemodialysis 3 times a week through the fistula on Tuesday, Thursday and Saturday. With expected reduced longevity, I want the patient to also consider stopping dialysis in the future.

## 2015-03-04 NOTE — Progress Notes (Signed)
[place] OFFICE PROGRESS NOTE  Patient Care Team: Boykin Nearing, MD as PCP - General (Family Medicine)  SUMMARY OF ONCOLOGIC HISTORY:   Multiple myeloma   03/30/2008 Initial Diagnosis Multiple myeloma   03/30/2008 - 05/01/2012 Chemotherapy Revlimid/Dex then Velcade (started on 07/13/2011 ending on 09/14/2011) without response.  Managed by Dr. Chauncey Fischer of [place], [place] [address]   07/24/2010 -  Chemotherapy Received zometa  intermittently over one year.  Creatinine was 1.0 on 07/24/2010. 1.7 on 08/10/2011.    05/01/2012 - 09/01/2012 Chemotherapy Starte Pomalidomide s/p 4 cycles. Referred to [facility] for consideration of ASCT.     10/08/2012 Bone Marrow Biopsy Normocellular marrow with residual/recurrently plasma cell myeloma, 60-70 % of overall marrow celluarity.  Flow: c/w plasma cell neoplasm, plasma cell infiltrate comprises 60-70 % of overall marrow cellularity; FISH: + for 1 q21/CKS1B gain; RB1 (13q14)   10/08/2012 Bone Marrow Biopsy Continued... Cytogenetics showed normal female karyotype.    10/29/2012 Tumor Marker High IgG (6019), high lamda free light chain (409.32) with low IgA, IgM and kappa free light chain.    04/21/2013 Imaging Skeletal survey.  No suspcious lytic or blastic lesions visualized.  Severe multilevel degenerative disc disease in the cervical spine.    04/29/2013 Imaging PeT. No discrete suspicious focus of FDG uptake identified.    05/20/2013 - 06/09/2013 Chemotherapy Started carfilxomib 39 mg on 08/25, 08/26, day 8 (06/02/13), day 9 on 06/03/2013 and on 06/09/2013.     12/03/2013 Treatment Plan Change Last office visit with Dr. Glenna Durand.  She decline further chemotherapy.   [name]    03/11/2014 - 03/17/2014 Hospital Admission Admitted for low hemoglobin. Dr. Burney Gauze consulted.  SPEP,  UPEP and Bone marrow biopsy obtained.    IgG 11,300. M-spike 7.52, kappa lamda ratio 0 with  lambda free light chains totaling 1260.    03/12/2014 Imaging Skeletal survey. 1. Small lytic lesions within the skull and right scapula. 2. Degenerative changes in the spine and knees, right greater than left. 3. Cardiomegaly without pulmonary edema.   03/17/2014 Bone Marrow Biopsy Hypercellular bone marrow tih extensive involvment by plasma cell neoplasm (Plasma cells 85%). FISH: Presence of 2 copies of the IGH/FGFR3, ATM, CCND1/IGH, and p53 probes.  Loss of 13q34 probes in 40% of cells. gain of chromose 12 and either loss of chorm   03/25/2014 -  Chemotherapy Planning Kyprolis 15 mg/m2 plus dexamethasone weekly plus revlimid (47m) daily on 06/29.   06/14/2014 -  Chemotherapy Patient's last dose of Kyprolis was on 05/26/14. Since then chemo held due to neutropenia despite GCSF support. Revlimid also on hold as of 06/14/14   06/28/2014 -  Chemotherapy New cycle of Kyprolis starts today.   07/07/2014 Tumor Marker IgG 3960    07/23/2014 Tumor Marker IgG 3620   10/04/2014 Tumor Marker IgG 2230 with M spike down to 0.06   12/20/2014 Tumor Marker  repeat M spike show 1.9 g IgG kappa. IgG level 2360 and lambda light chains at 333   01/07/2015 - 02/25/2015 Chemotherapy Her treatment is switched to palliative treatment with Daratumumab     INTERVAL HISTORY: Please see below for problem oriented charting. She returns today to review test results. She have recurrent infection for the past few months but none recently. Her energy level is the same. She denies new bone pain.  REVIEW OF SYSTEMS:   Constitutional: Denies fevers, chills or abnormal weight loss Eyes:  Denies blurriness of vision Ears, nose, mouth, throat, and face: Denies mucositis or sore throat Respiratory: Denies cough, dyspnea or wheezes Cardiovascular: Denies palpitation, chest discomfort  Gastrointestinal:  Denies nausea, heartburn or change in bowel habits Skin: Denies abnormal skin rashes Lymphatics: Denies new lymphadenopathy or easy  bruising Neurological:Denies numbness, tingling or new weaknesses Behavioral/Psych: Mood is stable, no new changes  All other systems were reviewed with the patient and are negative.  I have reviewed the past medical history, past surgical history, social history and family history with the patient and they are unchanged from previous note.  ALLERGIES:  has No Known Allergies.  MEDICATIONS:  Current Outpatient Prescriptions  Medication Sig Dispense Refill  . amLODipine (NORVASC) 5 MG tablet Take 1 tablet (5 mg total) by mouth daily. 30 tablet 5  . dexamethasone (DECADRON) 4 MG tablet Take 3 tablets (12 mg) on days 1,8,15, and 22 of chemotherapy. 24 tablet 2  . furosemide (LASIX) 20 MG tablet Take 1 tablet (20 mg total) by mouth daily. PRN for leg edema 60 tablet 2  . hydrALAZINE (APRESOLINE) 100 MG tablet Take 1 tablet (100 mg total) by mouth 3 (three) times daily. 90 tablet 5  . isosorbide mononitrate (IMDUR) 60 MG 24 hr tablet Take 1 tablet (60 mg total) by mouth daily. 30 tablet 5  . Nutritional Supplements (FEEDING SUPPLEMENT, NEPRO CARB STEADY,) LIQD Take 237 mLs by mouth 2 (two) times daily between meals. 237 mL 60  . ondansetron (ZOFRAN ODT) 4 MG disintegrating tablet Take 1 tablet (4 mg total) by mouth every 8 (eight) hours as needed for nausea or vomiting. 12 tablet 0  . pantoprazole (PROTONIX) 40 MG tablet Take 1 tablet (40 mg total) by mouth daily. 30 tablet 1  . prochlorperazine (COMPAZINE) 10 MG tablet Take 1 tablet (10 mg total) by mouth every 6 (six) hours as needed (Nausea or vomiting). 30 tablet 1  . traZODone (DESYREL) 50 MG tablet Take 0.5-1 tablets (25-50 mg total) by mouth at bedtime as needed for sleep. 30 tablet 3  . zolpidem (AMBIEN) 5 MG tablet Take 1 tablet (5 mg total) by mouth at bedtime as needed for sleep. 30 tablet 0   No current facility-administered medications for this visit.    PHYSICAL EXAMINATION: ECOG PERFORMANCE STATUS: 1 - Symptomatic but completely  ambulatory  Filed Vitals:   03/04/15 0910  BP: 133/76  Pulse: 65  Temp: 98 F (36.7 C)  Resp: 18   Filed Weights   03/04/15 0910  Weight: 133 lb 6.4 oz (60.51 kg)    GENERAL:alert, no distress and comfortable SKIN: skin color, texture, turgor are normal, no rashes or significant lesions EYES: normal, Conjunctiva are pink and non-injected, sclera clear OROPHARYNX:no exudate, no erythema and lips, buccal mucosa, and tongue normal  Musculoskeletal:no cyanosis of digits and no clubbing  NEURO: alert & oriented x 3 with fluent speech, no focal motor/sensory deficits  LABORATORY DATA:  I have reviewed the data as listed    Component Value Date/Time   NA 135* 02/25/2015 1002   NA 131* 02/09/2015 1824   K 4.2 02/25/2015 1002   K 4.9 02/09/2015 1824   CL 93* 02/09/2015 1824   CO2 29 02/25/2015 1002   CO2 28 02/09/2015 1824   GLUCOSE 86 02/25/2015 1002   GLUCOSE 80 02/09/2015 1824   BUN 22.3 02/25/2015 1002   BUN 24* 02/09/2015 1824   CREATININE 5.6* 02/25/2015 1002   CREATININE 6.43* 02/09/2015 1824   CALCIUM 9.8 02/25/2015 1002  CALCIUM 9.6 02/09/2015 1824   PROT 8.6* 02/25/2015 1002   PROT 8.4* 01/21/2015 0032   ALBUMIN 3.1* 02/25/2015 1002   ALBUMIN 3.4* 01/21/2015 0032   AST 16 02/25/2015 1002   AST 17 01/21/2015 0032   ALT 10 02/25/2015 1002   ALT 13 01/21/2015 0032   ALKPHOS 55 02/25/2015 1002   ALKPHOS 46 01/21/2015 0032   BILITOT 0.52 02/25/2015 1002   BILITOT 0.5 01/21/2015 0032   GFRNONAA 6* 02/09/2015 1824   GFRAA 7* 02/09/2015 1824    No results found for: SPEP, UPEP  Lab Results  Component Value Date   WBC 4.2 03/04/2015   NEUTROABS 2.8 03/04/2015   HGB 11.5* 03/04/2015   HCT 35.9 03/04/2015   MCV 99.1 03/04/2015   PLT 158 03/04/2015      Chemistry      Component Value Date/Time   NA 135* 02/25/2015 1002   NA 131* 02/09/2015 1824   K 4.2 02/25/2015 1002   K 4.9 02/09/2015 1824   CL 93* 02/09/2015 1824   CO2 29 02/25/2015 1002   CO2 28  02/09/2015 1824   BUN 22.3 02/25/2015 1002   BUN 24* 02/09/2015 1824   CREATININE 5.6* 02/25/2015 1002   CREATININE 6.43* 02/09/2015 1824      Component Value Date/Time   CALCIUM 9.8 02/25/2015 1002   CALCIUM 9.6 02/09/2015 1824   ALKPHOS 55 02/25/2015 1002   ALKPHOS 46 01/21/2015 0032   AST 16 02/25/2015 1002   AST 17 01/21/2015 0032   ALT 10 02/25/2015 1002   ALT 13 01/21/2015 0032   BILITOT 0.52 02/25/2015 1002   BILITOT 0.5 01/21/2015 0032      ASSESSMENT & PLAN:  Multiple myeloma Unfortunately, she continued to progress on treatment. Options at this point is very limited. Certainly, there are few other options available. I quote her response rates beyond this would be less than 20% with expected significant side effects. The patient had very difficulty understanding the natural history of multiple myeloma, the disease process and how it can affect her longevity. I recommend consideration for palliative care and hospice. The patient is not sure if she would be willing to give up chemotherapy yet. She requested another appointment in 2 weeks for further discussion. I will proceed to cancel all her treatment from now on.   Anemia in neoplastic disease She is not symptomatic and does not require transfusion or erythropoietin stimulating agent right now.      ESRD (end stage renal disease) She will continue hemodialysis 3 times a week through the fistula on Tuesday, Thursday and Saturday. With expected reduced longevity, I want the patient to also consider stopping dialysis in the future.    Drug-induced neutropenia This is likely due to recent treatment. She had been receiving G-CSF injection weekly. She is not neutropenic today and would not need G-CSF.    No orders of the defined types were placed in this encounter.   All questions were answered. The patient knows to call the clinic with any problems, questions or concerns. No barriers to learning was  detected. I spent 25 minutes counseling the patient face to face. The total time spent in the appointment was 30 minutes and more than 50% was on counseling and review of test results     [facility], Malek Skog, MD 03/04/2015 10:30 AM

## 2015-03-04 NOTE — Assessment & Plan Note (Signed)
Unfortunately, she continued to progress on treatment. Options at this point is very limited. Certainly, there are few other options available. I quote her response rates beyond this would be less than 20% with expected significant side effects. The patient had very difficulty understanding the natural history of multiple myeloma, the disease process and how it can affect her longevity. I recommend consideration for palliative care and hospice. The patient is not sure if she would be willing to give up chemotherapy yet. She requested another appointment in 2 weeks for further discussion. I will proceed to cancel all her treatment from now on.

## 2015-03-04 NOTE — Assessment & Plan Note (Signed)
She is not symptomatic and does not require transfusion or erythropoietin stimulating agent right now.

## 2015-03-04 NOTE — Assessment & Plan Note (Signed)
This is likely due to recent treatment. She had been receiving G-CSF injection weekly. She is not neutropenic today and would not need G-CSF.

## 2015-03-04 NOTE — Telephone Encounter (Signed)
Gave and printed appt sched and avs for pt for JUNE °

## 2015-03-11 ENCOUNTER — Other Ambulatory Visit: Payer: Self-pay

## 2015-03-11 ENCOUNTER — Ambulatory Visit: Payer: Self-pay

## 2015-03-18 ENCOUNTER — Ambulatory Visit (HOSPITAL_BASED_OUTPATIENT_CLINIC_OR_DEPARTMENT_OTHER): Payer: Medicare Other | Admitting: Hematology and Oncology

## 2015-03-18 ENCOUNTER — Other Ambulatory Visit: Payer: Self-pay

## 2015-03-18 ENCOUNTER — Encounter: Payer: Self-pay | Admitting: Hematology and Oncology

## 2015-03-18 VITALS — BP 147/74 | HR 69 | Temp 99.1°F | Resp 18 | Ht 67.0 in | Wt 137.0 lb

## 2015-03-18 DIAGNOSIS — C9000 Multiple myeloma not having achieved remission: Secondary | ICD-10-CM | POA: Diagnosis present

## 2015-03-18 NOTE — Assessment & Plan Note (Addendum)
I have a long discussion with the patient and her daughter, Debroah Baller. I described the natural history of multiple myeloma. The patient is refractory to multiple lines of treatment including Revlimid, Velcade, Pomalidomide, Carfilzomib and Daratumumab. Certainly, one can consider melphalan, thalidomide and a few others lines of treatment. However, given her significant comorbidities, combination chemotherapy would cause significant risk of life-threatening infection. The patient had recurrent admissions to the hospital recently for infectious complications and had been receiving G-CSF injection due to chronic neutropenia. I would not be comfortable prescribing combination therapy.  Single agent therapy would probably bring the yield of less than 20% response rates. The patient mentioned she wants to try stem cell transplant as potential treatment for multiple myeloma. Given her age, comorbidities, and present end-stage kidney disease, I told her it would be virtually impossible to recommend stem cell transplant. Also, before once can proceed with stem cell transplant, she would need some forms of aggressive treatment to reduce disease burden before she can pursue this modality. The patient had very limited understanding and despite my best effort using very simple language, I do not believe she can grasp the concept of autologous stem cell transplant. Personally, I would not recommend any form of further palliative chemotherapy given poor response rates to further palliative treatment and potential significant risk of side effects. I recommend palliative care and hospice but the patient cannot accept the concept of not going for further treatment. She cannot accept the fact that some of the side effects of treatment could potentially be life-threatening and can shorten her survivor without bringing any additional benefit. After very prolonged discussion, ultimately she wants to be referred to [facility] for second opinion.

## 2015-03-18 NOTE — Progress Notes (Signed)
[facility] OFFICE PROGRESS NOTE  Patient Care Team: Boykin Nearing, MD as PCP - General (Family Medicine)  SUMMARY OF ONCOLOGIC HISTORY:   Multiple myeloma   03/30/2008 Initial Diagnosis Multiple myeloma   03/30/2008 - 05/01/2012 Chemotherapy Revlimid/Dex then Velcade (started on 07/13/2011 ending on 09/14/2011) without response.  Managed by Dr. Chauncey Fischer of [place], [place] [address]   07/24/2010 -  Chemotherapy Received zometa  intermittently over one year.  Creatinine was 1.0 on 07/24/2010. 1.7 on 08/10/2011.    05/01/2012 - 09/01/2012 Chemotherapy Starte Pomalidomide s/p 4 cycles. Referred to [facility] for consideration of ASCT.     10/08/2012 Bone Marrow Biopsy Normocellular marrow with residual/recurrently plasma cell myeloma, 60-70 % of overall marrow celluarity.  Flow: c/w plasma cell neoplasm, plasma cell infiltrate comprises 60-70 % of overall marrow cellularity; FISH: + for 1 q21/CKS1B gain; RB1 (13q14)   10/08/2012 Bone Marrow Biopsy Continued... Cytogenetics showed normal female karyotype.    10/29/2012 Tumor Marker High IgG (6019), high lamda free light chain (409.32) with low IgA, IgM and kappa free light chain.    04/21/2013 Imaging Skeletal survey.  No suspcious lytic or blastic lesions visualized.  Severe multilevel degenerative disc disease in the cervical spine.    04/29/2013 Imaging PeT. No discrete suspicious focus of FDG uptake identified.    05/20/2013 - 06/09/2013 Chemotherapy Started carfilxomib 39 mg on 08/25, 08/26, day 8 (06/02/13), day 9 on 06/03/2013 and on 06/09/2013.     12/03/2013 Treatment Plan Change Last office visit with Dr. Glenna Durand.  She decline further chemotherapy.   [name]    03/11/2014 - 03/17/2014 Hospital Admission Admitted for low hemoglobin. Dr. Burney Gauze consulted.  SPEP,  UPEP and Bone marrow biopsy obtained.    IgG 11,300. M-spike 7.52, kappa lamda ratio 0 with  lambda free light chains totaling 1260.    03/12/2014 Imaging Skeletal survey. 1. Small lytic lesions within the skull and right scapula. 2. Degenerative changes in the spine and knees, right greater than left. 3. Cardiomegaly without pulmonary edema.   03/17/2014 Bone Marrow Biopsy Hypercellular bone marrow tih extensive involvment by plasma cell neoplasm (Plasma cells 85%). FISH: Presence of 2 copies of the IGH/FGFR3, ATM, CCND1/IGH, and p53 probes.  Loss of 13q34 probes in 40% of cells. gain of chromose 12 and either loss of chorm   03/25/2014 - 12/22/2014 Chemotherapy Planning Kyprolis 15 mg/m2 plus dexamethasone weekly plus revlimid (3m) daily on 06/29. Treatment was stopped due to progression   06/14/2014 Adverse Reaction Chemo held due to neutropenia despite GCSF support. Revlimid also on hold as of 06/14/14   07/07/2014 Tumor Marker IgG 3960    07/23/2014 Tumor Marker IgG 3620   10/04/2014 Tumor Marker IgG 2230 with M spike down to 0.06   12/20/2014 Tumor Marker  repeat M spike show 1.9 g IgG kappa. IgG level 2360 and lambda light chains at 333   01/07/2015 - 02/25/2015 Chemotherapy Her treatment is switched to palliative treatment with Daratumumab. Treatment was stopped due to progression   02/25/2015 Tumor Marker Repeat M spike show 2.8g IgG kappa. IgG level 3320 and lambda light chains at 332    INTERVAL HISTORY: Please see below for problem oriented charting. She returns today with her daughter to discuss further plan of care regarding refractory multiple myeloma  REVIEW OF SYSTEMS:   Constitutional: Denies fevers, chills  She has anorexia She denies recent infection. She denies  bone pain. All other systems were reviewed with the patient and are negative.  I have reviewed the past medical history, past surgical history, social history and family history with the patient and they are unchanged from previous note.  ALLERGIES:  has No Known Allergies.  MEDICATIONS:  Current Outpatient  Prescriptions  Medication Sig Dispense Refill  . amLODipine (NORVASC) 5 MG tablet Take 1 tablet (5 mg total) by mouth daily. 30 tablet 5  . dexamethasone (DECADRON) 4 MG tablet Take 3 tablets (12 mg) on days 1,8,15, and 22 of chemotherapy. 24 tablet 2  . furosemide (LASIX) 20 MG tablet Take 1 tablet (20 mg total) by mouth daily. PRN for leg edema 60 tablet 2  . hydrALAZINE (APRESOLINE) 100 MG tablet Take 1 tablet (100 mg total) by mouth 3 (three) times daily. 90 tablet 5  . isosorbide mononitrate (IMDUR) 60 MG 24 hr tablet Take 1 tablet (60 mg total) by mouth daily. 30 tablet 5  . Nutritional Supplements (FEEDING SUPPLEMENT, NEPRO CARB STEADY,) LIQD Take 237 mLs by mouth 2 (two) times daily between meals. 237 mL 60  . ondansetron (ZOFRAN ODT) 4 MG disintegrating tablet Take 1 tablet (4 mg total) by mouth every 8 (eight) hours as needed for nausea or vomiting. 12 tablet 0  . pantoprazole (PROTONIX) 40 MG tablet Take 1 tablet (40 mg total) by mouth daily. 30 tablet 1  . prochlorperazine (COMPAZINE) 10 MG tablet Take 1 tablet (10 mg total) by mouth every 6 (six) hours as needed (Nausea or vomiting). 30 tablet 1  . traZODone (DESYREL) 50 MG tablet Take 0.5-1 tablets (25-50 mg total) by mouth at bedtime as needed for sleep. 30 tablet 3  . zolpidem (AMBIEN) 5 MG tablet Take 1 tablet (5 mg total) by mouth at bedtime as needed for sleep. 30 tablet 0   No current facility-administered medications for this visit.    PHYSICAL EXAMINATION: ECOG PERFORMANCE STATUS: 1 - Symptomatic but completely ambulatory  Filed Vitals:   03/18/15 1142  BP: 147/74  Pulse: 69  Temp:   Resp:    Filed Weights   03/18/15 1138  Weight: 137 lb (62.143 kg)    GENERAL:alert, no distress and comfortable SKIN: skin color, texture, turgor are normal, no rashes or significant lesions EYES: normal, Conjunctiva are pink and non-injected, sclera clear Musculoskeletal:no cyanosis of digits and no clubbing  NEURO: alert &  oriented x 3 with fluent speech, no focal motor/sensory deficits  LABORATORY DATA:  I have reviewed the data as listed    Component Value Date/Time   NA 135* 02/25/2015 1002   NA 131* 02/09/2015 1824   K 4.2 02/25/2015 1002   K 4.9 02/09/2015 1824   CL 93* 02/09/2015 1824   CO2 29 02/25/2015 1002   CO2 28 02/09/2015 1824   GLUCOSE 86 02/25/2015 1002   GLUCOSE 80 02/09/2015 1824   BUN 22.3 02/25/2015 1002   BUN 24* 02/09/2015 1824   CREATININE 5.6* 02/25/2015 1002   CREATININE 6.43* 02/09/2015 1824   CALCIUM 9.8 02/25/2015 1002   CALCIUM 9.6 02/09/2015 1824   PROT 8.6* 02/25/2015 1002   PROT 8.4* 01/21/2015 0032   ALBUMIN 3.1* 02/25/2015 1002   ALBUMIN 3.4* 01/21/2015 0032   AST 16 02/25/2015 1002   AST 17 01/21/2015 0032   ALT 10 02/25/2015 1002   ALT 13 01/21/2015 0032   ALKPHOS 55 02/25/2015 1002   ALKPHOS 46 01/21/2015 0032   BILITOT 0.52 02/25/2015 1002   BILITOT 0.5 01/21/2015 0032  GFRNONAA 6* 02/09/2015 1824   GFRAA 7* 02/09/2015 1824    No results found for: SPEP, UPEP  Lab Results  Component Value Date   WBC 4.2 03/04/2015   NEUTROABS 2.8 03/04/2015   HGB 11.5* 03/04/2015   HCT 35.9 03/04/2015   MCV 99.1 03/04/2015   PLT 158 03/04/2015      Chemistry      Component Value Date/Time   NA 135* 02/25/2015 1002   NA 131* 02/09/2015 1824   K 4.2 02/25/2015 1002   K 4.9 02/09/2015 1824   CL 93* 02/09/2015 1824   CO2 29 02/25/2015 1002   CO2 28 02/09/2015 1824   BUN 22.3 02/25/2015 1002   BUN 24* 02/09/2015 1824   CREATININE 5.6* 02/25/2015 1002   CREATININE 6.43* 02/09/2015 1824      Component Value Date/Time   CALCIUM 9.8 02/25/2015 1002   CALCIUM 9.6 02/09/2015 1824   ALKPHOS 55 02/25/2015 1002   ALKPHOS 46 01/21/2015 0032   AST 16 02/25/2015 1002   AST 17 01/21/2015 0032   ALT 10 02/25/2015 1002   ALT 13 01/21/2015 0032   BILITOT 0.52 02/25/2015 1002   BILITOT 0.5 01/21/2015 0032      ASSESSMENT & PLAN:  Multiple myeloma I have a  long discussion with the patient and her daughter, Debroah Baller. I described the natural history of multiple myeloma. The patient is refractory to multiple lines of treatment including Revlimid, Velcade, Pomalidomide, Carfilzomib and Daratumumab. Certainly, one can consider melphalan, thalidomide and a few others lines of treatment. However, given her significant comorbidities, combination chemotherapy would cause significant risk of life-threatening infection. The patient had recurrent admissions to the hospital recently for infectious complications and had been receiving G-CSF injection due to chronic neutropenia. I would not be comfortable prescribing combination therapy.  Single agent therapy would probably bring the yield of less than 20% response rates. The patient mentioned she wants to try stem cell transplant as potential treatment for multiple myeloma. Given her age, comorbidities, and present end-stage kidney disease, I told her it would be virtually impossible to recommend stem cell transplant. Also, before once can proceed with stem cell transplant, she would need some forms of aggressive treatment to reduce disease burden before she can pursue this modality. The patient had very limited understanding and despite my best effort using very simple language, I do not believe she can grasp the concept of autologous stem cell transplant. Personally, I would not recommend any form of further palliative chemotherapy given poor response rates to further palliative treatment and potential significant risk of side effects. I recommend palliative care and hospice but the patient cannot accept the concept of not going for further treatment. She cannot accept the fact that some of the side effects of treatment could potentially be life-threatening and can shorten her survivor without bringing any additional benefit. After very prolonged discussion, ultimately she wants to be referred to [facility] for  second opinion.   No orders of the defined types were placed in this encounter.   All questions were answered. The patient knows to call the clinic with any problems, questions or concerns. No barriers to learning was detected. I spent 40 minutes counseling the patient face to face. The total time spent in the appointment was 60 minutes and more than 50% was on counseling and review of test results     [facility], [place], MD 03/18/2015 3:33 PM

## 2015-03-21 ENCOUNTER — Telehealth: Payer: Self-pay | Admitting: Hematology and Oncology

## 2015-03-21 ENCOUNTER — Ambulatory Visit: Payer: Medicare Other | Attending: Family Medicine

## 2015-03-21 VITALS — BP 170/86 | HR 52 | Temp 97.9°F | Resp 18 | Wt 132.2 lb

## 2015-03-21 DIAGNOSIS — I10 Essential (primary) hypertension: Secondary | ICD-10-CM

## 2015-03-21 DIAGNOSIS — N186 End stage renal disease: Secondary | ICD-10-CM | POA: Diagnosis not present

## 2015-03-21 DIAGNOSIS — R609 Edema, unspecified: Secondary | ICD-10-CM | POA: Diagnosis not present

## 2015-03-21 DIAGNOSIS — I120 Hypertensive chronic kidney disease with stage 5 chronic kidney disease or end stage renal disease: Secondary | ICD-10-CM | POA: Diagnosis not present

## 2015-03-21 DIAGNOSIS — Z992 Dependence on renal dialysis: Secondary | ICD-10-CM | POA: Insufficient documentation

## 2015-03-21 MED ORDER — CLONIDINE HCL 0.1 MG PO TABS: 0.1000 mg | ORAL_TABLET | Freq: Once | ORAL | Status: DC

## 2015-03-21 MED ORDER — FUROSEMIDE 20 MG PO TABS: 20.0000 mg | ORAL_TABLET | Freq: Every day | ORAL | Status: DC

## 2015-03-21 MED ORDER — AMLODIPINE BESYLATE 10 MG PO TABS: 5.0000 mg | ORAL_TABLET | Freq: Every day | ORAL | Status: DC

## 2015-03-21 NOTE — Progress Notes (Signed)
Patient here for BP check. Patient did not take BP medications today. Patient to come back 04/06/15 to see Dr. Adrian Blackwater due to BP meds were changed by Dr. Adrian Blackwater per patients request.

## 2015-03-21 NOTE — Telephone Encounter (Signed)
Faxed pt medical records to [facility]. Belinda from [facility] will call pt's dtr. With appt.

## 2015-04-01 ENCOUNTER — Other Ambulatory Visit: Payer: Self-pay

## 2015-04-06 ENCOUNTER — Ambulatory Visit: Payer: Self-pay | Admitting: Family Medicine

## 2015-04-10 ENCOUNTER — Inpatient Hospital Stay (HOSPITAL_COMMUNITY)
Admission: EM | Admit: 2015-04-10 | Discharge: 2015-04-13 | DRG: 884 | Disposition: A | Discharge status: Skilled Nursing Facility | Payer: Medicare Other | Attending: Internal Medicine | Admitting: Internal Medicine

## 2015-04-10 ENCOUNTER — Emergency Department (HOSPITAL_COMMUNITY): Payer: Medicare Other

## 2015-04-10 ENCOUNTER — Encounter (HOSPITAL_COMMUNITY): Payer: Self-pay | Admitting: *Deleted

## 2015-04-10 DIAGNOSIS — E43 Unspecified severe protein-calorie malnutrition: Secondary | ICD-10-CM | POA: Diagnosis present

## 2015-04-10 DIAGNOSIS — F0391 Unspecified dementia with behavioral disturbance: Principal | ICD-10-CM | POA: Diagnosis present

## 2015-04-10 DIAGNOSIS — W06XXXA Fall from bed, initial encounter: Secondary | ICD-10-CM | POA: Diagnosis present

## 2015-04-10 DIAGNOSIS — R4182 Altered mental status, unspecified: Secondary | ICD-10-CM

## 2015-04-10 DIAGNOSIS — W19XXXA Unspecified fall, initial encounter: Secondary | ICD-10-CM | POA: Diagnosis present

## 2015-04-10 DIAGNOSIS — I120 Hypertensive chronic kidney disease with stage 5 chronic kidney disease or end stage renal disease: Secondary | ICD-10-CM | POA: Diagnosis present

## 2015-04-10 DIAGNOSIS — K219 Gastro-esophageal reflux disease without esophagitis: Secondary | ICD-10-CM | POA: Diagnosis present

## 2015-04-10 DIAGNOSIS — E875 Hyperkalemia: Secondary | ICD-10-CM | POA: Diagnosis present

## 2015-04-10 DIAGNOSIS — C9000 Multiple myeloma not having achieved remission: Secondary | ICD-10-CM | POA: Diagnosis present

## 2015-04-10 DIAGNOSIS — Z9221 Personal history of antineoplastic chemotherapy: Secondary | ICD-10-CM

## 2015-04-10 DIAGNOSIS — Z6820 Body mass index (BMI) 20.0-20.9, adult: Secondary | ICD-10-CM

## 2015-04-10 DIAGNOSIS — D649 Anemia, unspecified: Secondary | ICD-10-CM | POA: Diagnosis present

## 2015-04-10 DIAGNOSIS — I10 Essential (primary) hypertension: Secondary | ICD-10-CM | POA: Diagnosis not present

## 2015-04-10 DIAGNOSIS — N2581 Secondary hyperparathyroidism of renal origin: Secondary | ICD-10-CM | POA: Diagnosis present

## 2015-04-10 DIAGNOSIS — E871 Hypo-osmolality and hyponatremia: Secondary | ICD-10-CM | POA: Diagnosis present

## 2015-04-10 DIAGNOSIS — Z79899 Other long term (current) drug therapy: Secondary | ICD-10-CM

## 2015-04-10 DIAGNOSIS — Z992 Dependence on renal dialysis: Secondary | ICD-10-CM

## 2015-04-10 DIAGNOSIS — N186 End stage renal disease: Secondary | ICD-10-CM | POA: Diagnosis present

## 2015-04-10 LAB — CBC WITH DIFFERENTIAL/PLATELET
Basophils Absolute: 0 10*3/uL (ref 0.0–0.1)
Basophils Relative: 0 % (ref 0–1)
EOS ABS: 0 10*3/uL (ref 0.0–0.7)
EOS PCT: 0 % (ref 0–5)
HEMATOCRIT: 44.4 % (ref 36.0–46.0)
Hemoglobin: 14.4 g/dL (ref 12.0–15.0)
Lymphocytes Relative: 20 % (ref 12–46)
Lymphs Abs: 1.1 10*3/uL (ref 0.7–4.0)
MCH: 31.4 pg (ref 26.0–34.0)
MCHC: 32.4 g/dL (ref 30.0–36.0)
MCV: 96.9 fL (ref 78.0–100.0)
Monocytes Absolute: 0.4 10*3/uL (ref 0.1–1.0)
Monocytes Relative: 8 % (ref 3–12)
Neutro Abs: 4.1 10*3/uL (ref 1.7–7.7)
Neutrophils Relative %: 72 % (ref 43–77)
Platelets: 201 10*3/uL (ref 150–400)
RBC: 4.58 MIL/uL (ref 3.87–5.11)
RDW: 14.6 % (ref 11.5–15.5)
WBC: 5.7 10*3/uL (ref 4.0–10.5)

## 2015-04-10 LAB — URINALYSIS, ROUTINE W REFLEX MICROSCOPIC
Bilirubin Urine: NEGATIVE
Glucose, UA: 100 mg/dL — AB
Ketones, ur: NEGATIVE mg/dL
Nitrite: NEGATIVE
Protein, ur: 100 mg/dL — AB
Specific Gravity, Urine: 1.008 (ref 1.005–1.030)
Urobilinogen, UA: 0.2 mg/dL (ref 0.0–1.0)
pH: 8 (ref 5.0–8.0)

## 2015-04-10 LAB — COMPREHENSIVE METABOLIC PANEL
ALT: 13 U/L — AB (ref 14–54)
AST: 36 U/L (ref 15–41)
Albumin: 3.4 g/dL — ABNORMAL LOW (ref 3.5–5.0)
Alkaline Phosphatase: 42 U/L (ref 38–126)
Anion gap: 12 (ref 5–15)
BILIRUBIN TOTAL: 1.2 mg/dL (ref 0.3–1.2)
BUN: 63 mg/dL — ABNORMAL HIGH (ref 6–20)
CHLORIDE: 92 mmol/L — AB (ref 101–111)
CO2: 25 mmol/L (ref 22–32)
Calcium: 10.5 mg/dL — ABNORMAL HIGH (ref 8.9–10.3)
Creatinine, Ser: 11.23 mg/dL — ABNORMAL HIGH (ref 0.44–1.00)
GFR, EST AFRICAN AMERICAN: 3 mL/min — AB (ref >60)
GFR, EST NON AFRICAN AMERICAN: 3 mL/min — AB (ref >60)
GLUCOSE: 77 mg/dL (ref 65–99)
Potassium: 5.9 mmol/L — ABNORMAL HIGH (ref 3.5–5.1)
Sodium: 129 mmol/L — ABNORMAL LOW (ref 135–145)
Total Protein: 10.3 g/dL — ABNORMAL HIGH (ref 6.5–8.1)

## 2015-04-10 LAB — RENAL FUNCTION PANEL
Albumin: 3.5 g/dL (ref 3.5–5.0)
Anion gap: 13 (ref 5–15)
BUN: 64 mg/dL — ABNORMAL HIGH (ref 6–20)
CO2: 25 mmol/L (ref 22–32)
Calcium: 10.4 mg/dL — ABNORMAL HIGH (ref 8.9–10.3)
Chloride: 91 mmol/L — ABNORMAL LOW (ref 101–111)
Creatinine, Ser: 11.54 mg/dL — ABNORMAL HIGH (ref 0.44–1.00)
GFR calc Af Amer: 3 mL/min — ABNORMAL LOW (ref >60)
GFR calc non Af Amer: 3 mL/min — ABNORMAL LOW (ref >60)
Glucose, Bld: 69 mg/dL (ref 65–99)
Phosphorus: 8.7 mg/dL — ABNORMAL HIGH (ref 2.5–4.6)
Potassium: 6 mmol/L — ABNORMAL HIGH (ref 3.5–5.1)
Sodium: 129 mmol/L — ABNORMAL LOW (ref 135–145)

## 2015-04-10 LAB — URINE MICROSCOPIC-ADD ON

## 2015-04-10 LAB — I-STAT CG4 LACTIC ACID, ED
Lactic Acid, Venous: 1.06 mmol/L (ref 0.5–2.0)
Lactic Acid, Venous: 0.43 mmol/L — ABNORMAL LOW (ref 0.5–2.0)

## 2015-04-10 LAB — CK: Total CK: 268 U/L — ABNORMAL HIGH (ref 38–234)

## 2015-04-10 MED ORDER — SODIUM CHLORIDE 0.9 % IJ SOLN
3.0000 mL | Freq: Two times a day (BID) | INTRAMUSCULAR | Status: DC
  Administered 2015-04-11 – 2015-04-13 (×5): 3 mL via INTRAVENOUS

## 2015-04-10 MED ORDER — SODIUM CHLORIDE 0.9 % IV SOLN: 100.0000 mL | INTRAVENOUS | Status: DC | PRN

## 2015-04-10 MED ORDER — PENTAFLUOROPROP-TETRAFLUOROETH EX AERO: 1.0000 "application " | INHALATION_SPRAY | CUTANEOUS | Status: DC | PRN

## 2015-04-10 MED ORDER — AMLODIPINE BESYLATE 5 MG PO TABS
5.0000 mg | ORAL_TABLET | Freq: Every day | ORAL | Status: DC
  Administered 2015-04-11 – 2015-04-13 (×3): 5 mg via ORAL
  Filled 2015-04-10 (×4): qty 1

## 2015-04-10 MED ORDER — HEPARIN SODIUM (PORCINE) 5000 UNIT/ML IJ SOLN
5000.0000 [IU] | Freq: Three times a day (TID) | INTRAMUSCULAR | Status: DC
  Administered 2015-04-11 – 2015-04-13 (×6): 5000 [IU] via SUBCUTANEOUS
  Filled 2015-04-10 (×11): qty 1

## 2015-04-10 MED ORDER — CLONIDINE HCL 0.1 MG PO TABS: 0.1000 mg | ORAL_TABLET | Freq: Once | ORAL | Status: DC

## 2015-04-10 MED ORDER — ONDANSETRON 4 MG PO TBDP: 4.0000 mg | ORAL_TABLET | Freq: Three times a day (TID) | ORAL | Status: DC | PRN

## 2015-04-10 MED ORDER — NEPRO/CARBSTEADY PO LIQD: 237.0000 mL | ORAL | Status: DC | PRN

## 2015-04-10 MED ORDER — HYDRALAZINE HCL 50 MG PO TABS
100.0000 mg | ORAL_TABLET | Freq: Three times a day (TID) | ORAL | Status: DC
  Administered 2015-04-11 – 2015-04-13 (×5): 100 mg via ORAL
  Filled 2015-04-10 (×11): qty 2

## 2015-04-10 MED ORDER — LIDOCAINE HCL (PF) 1 % IJ SOLN: 5.0000 mL | INTRAMUSCULAR | Status: DC | PRN

## 2015-04-10 MED ORDER — ISOSORBIDE MONONITRATE ER 60 MG PO TB24
60.0000 mg | ORAL_TABLET | Freq: Every day | ORAL | Status: DC
  Administered 2015-04-11 – 2015-04-13 (×3): 60 mg via ORAL
  Filled 2015-04-10 (×4): qty 1

## 2015-04-10 MED ORDER — HYDRALAZINE HCL 100 MG PO TABS: 100.0000 mg | ORAL_TABLET | Freq: Three times a day (TID) | ORAL | Status: DC

## 2015-04-10 MED ORDER — LIDOCAINE-PRILOCAINE 2.5-2.5 % EX CREA: 1.0000 "application " | TOPICAL_CREAM | CUTANEOUS | Status: DC | PRN

## 2015-04-10 MED ORDER — ALTEPLASE 2 MG IJ SOLR
2.0000 mg | Freq: Once | INTRAMUSCULAR | Status: AC | PRN
  Filled 2015-04-10: qty 2

## 2015-04-10 MED ORDER — HEPARIN SODIUM (PORCINE) 1000 UNIT/ML DIALYSIS: 1000.0000 [IU] | INTRAMUSCULAR | Status: DC | PRN

## 2015-04-10 MED ORDER — PANTOPRAZOLE SODIUM 40 MG PO TBEC
40.0000 mg | DELAYED_RELEASE_TABLET | Freq: Every day | ORAL | Status: DC
  Administered 2015-04-11 – 2015-04-13 (×3): 40 mg via ORAL
  Filled 2015-04-10 (×2): qty 1

## 2015-04-10 NOTE — ED Provider Notes (Signed)
CSN: [id number]     Arrival date & time 04/10/15  1222 History   None    Chief Complaint  Patient presents with  . Fall  . Hip Pain   HPI Patient is a 74 year old female with a history of ESRD on dialysis and multiple myeloma presented today for increasing altered mental status in the fall. Was given new treatment for multiple myeloma 1 week ago and subsequent has had decreased mental status and slurred speech and shaking episodes. Last night daughter reports that patient fell from her bed unable to get up until this morning. Unknown amount of time down. Patient missed the hitting her head at this time. Denies any current chest pain abdominal pain, nausea, vomiting.  When complains of mild left-sided hip pain at this time.  Past Medical History  Diagnosis Date  . Renal disorder   . ESRD (end stage renal disease)   . Hypertension   . Multiple myeloma 2009  . Thyroid disease   . Hyperparathyroidism   . Thrombocytopenia   . Anemia   . Shortness of breath   . Pneumonia   . GERD (gastroesophageal reflux disease)   . Arthritis     rt knee is stiff  . Multiple myeloma    Past Surgical History  Procedure Laterality Date  . Abdominal hysterectomy      Pt. denies   Family History  Problem Relation Age of Onset  . Hypertension Mother   . Hypertension Father    History  Substance Use Topics  . Smoking status: Never Smoker   . Smokeless tobacco: Never Used  . Alcohol Use: No   OB History    No data available     Review of Systems  Constitutional: Negative for fever and chills.  HENT: Negative for congestion and sore throat.   Eyes: Negative for pain.  Respiratory: Negative for cough and shortness of breath.   Cardiovascular: Negative for chest pain and palpitations.  Gastrointestinal: Negative for nausea, vomiting, abdominal pain and diarrhea.  Genitourinary: Negative for dysuria and flank pain.  Musculoskeletal: Positive for arthralgias (left hip pain). Negative for back  pain and neck pain.  Skin: Negative for rash.  Allergic/Immunologic: Negative.   Neurological: Positive for dizziness and tremors. Negative for light-headedness.  Psychiatric/Behavioral: Negative for confusion.      Allergies  Review of patient's allergies indicates no known allergies.  Home Medications   Prior to Admission medications   Medication Sig Start Date End Date Taking? Authorizing Provider  amLODipine (NORVASC) 10 MG tablet Take 0.5 tablets (5 mg total) by mouth daily. 03/21/15   Josalyn Funches, MD  dexamethasone (DECADRON) 4 MG tablet Take 3 tablets (12 mg) on days 1,8,15, and 22 of chemotherapy. Patient not taking: Reported on 03/21/2015 06/14/14   Bernadene Bell, MD  furosemide (LASIX) 20 MG tablet Take 1 tablet (20 mg total) by mouth daily. PRN for leg edema 03/21/15   Boykin Nearing, MD  Nutritional Supplements (FEEDING SUPPLEMENT, NEPRO CARB STEADY,) LIQD Take 237 mLs by mouth 2 (two) times daily between meals. Patient not taking: Reported on 03/21/2015 10/19/14   Karlene Einstein, MD  ondansetron (ZOFRAN ODT) 4 MG disintegrating tablet Take 1 tablet (4 mg total) by mouth every 8 (eight) hours as needed for nausea or vomiting. Patient not taking: Reported on 03/21/2015 01/21/15   Everlene Balls, MD  pantoprazole (PROTONIX) 40 MG tablet Take 1 tablet (40 mg total) by mouth daily. Patient not taking: Reported on 03/21/2015 02/16/15   Enobong  Jarold Song, MD  prochlorperazine (COMPAZINE) 10 MG tablet Take 1 tablet (10 mg total) by mouth every 6 (six) hours as needed (Nausea or vomiting). Patient not taking: Reported on 03/21/2015 03/25/14   Concha Norway, MD  traZODone (DESYREL) 50 MG tablet Take 0.5-1 tablets (25-50 mg total) by mouth at bedtime as needed for sleep. 02/23/15   Josalyn Funches, MD  zolpidem (AMBIEN) 5 MG tablet Take 1 tablet (5 mg total) by mouth at bedtime as needed for sleep. 02/11/15   Ni Gorsuch, MD   BP 167/101 mmHg  Pulse 58  Temp(Src) 97.3 F (36.3 C) (Oral)  Resp 18   Ht '5\' 6"'  (1.676 m)  Wt 136 lb (61.689 kg)  BMI 21.96 kg/m2  SpO2 97% Physical Exam  Constitutional: She appears well-developed and well-nourished. No distress.  HENT:  Head: Normocephalic and atraumatic.  Eyes: Conjunctivae and EOM are normal. Pupils are equal, round, and reactive to light.  Neck: Normal range of motion. Neck supple.  Cardiovascular: Normal rate, regular rhythm and normal heart sounds.   Pulses:      Radial pulses are 2+ on the right side, and 2+ on the left side.  Pulmonary/Chest: Effort normal and breath sounds normal. No respiratory distress.  Abdominal: Soft. Bowel sounds are normal. There is no tenderness.  Musculoskeletal: Normal range of motion.  Neurological: She is alert. She has normal reflexes. She is disoriented. She displays tremor. No cranial nerve deficit or sensory deficit. GCS eye subscore is 4. GCS verbal subscore is 5. GCS motor subscore is 6.  Normal finger to nose bilaterally.  Rapid alternating movements intact bilaterally.  Normal heal to shin bilaterally.   No pronator drift bilaterally.    Skin: Skin is warm and dry. She is not diaphoretic.  Psychiatric: She has a normal mood and affect.    ED Course  Procedures (including critical care time) Labs Review Labs Reviewed - No data to display  Imaging Review Dg Hip Unilat With Pelvis 2-3 Views Left  04/10/2015   CLINICAL DATA:  Fall in bedroom this morning. Left hip injury and pain. Multiple myeloma. Initial encounter.  EXAM: DG HIP (WITH OR WITHOUT PELVIS) 2-3V LEFT  COMPARISON:  None.  FINDINGS: There is no evidence of hip fracture or dislocation. There is no evidence of hip joint arthropathy or focal lytic or sclerotic bone lesions.  IMPRESSION: Negative.   Electronically Signed   By: Earle Gell M.D.   On: 04/10/2015 14:21     EKG Interpretation None      MDM   Final diagnoses:  None   Patient is a 74 year old female with a history of ESRD on dialysis and multiple myeloma  presented today for increasing altered mental status and fall.    On initial evaluation patient hemodynamically stable and in no acute distress. She was frankly altered on examination and unable to perform full review of systems due to compliance. CT head and cervical spine showing no acute intracranial abnormalities or cervical spine fractures. This displayed multiple myeloma metastases however. XR hip with no fractures. BMP showing frankly increased creatinine and BUN. Likely alter mental status from uremia or new chemo. Discussed with nephrologist Dr. Jonnie Finner who evaluated pt at bedside and recommended dialysis.  Potassium of 6.0 but no EKG changes and patient not temporized. Will likely need dialysis tonight for resolution.  UA performed showing moderate leuks but micro-showing rare bacteria and no pyuria. Will not treat for UTI at this time. Doubt infectious etiology of patient's mental status  changes and given no antibiotics in the emergency department. Most likely due to uremia. Consulted hospitalist Dr. Dreama Saa for admission.  If performed, labs, EKGs, and imaging were reviewed/interpreted by myself and my attending and incorporated into medical decision making.  Discussed pertinent finding with patient or caregiver prior to admission with no further questions.  Pt care supervised by my attending Dr. Marja Kays, MD PGY-2  Emergency Medicine     Geronimo Boot, MD 04/11/15 [place], MD 04/13/15 [phone]

## 2015-04-10 NOTE — Progress Notes (Signed)
New Admission Note:   Arrival: From ED with RN via stretcher Mental Orientation: Alert to self, place, and time, but disoriented to situation Telemetry: Ordered and placed on box 6e28, NSR Assessment:  See doc flowsheet Skin: Fissure on mid, lower sacrum.  Bruise on left hip.  Otherwise, skin intact.  Assessed with charge nurse, Joelene Millin.  IV: Right AC IV Pain: None Safety Measures:  Call bell placed within reach; patient instructed on use of call bell and verbalized understanding. Bed in lowest position.  Non-skid socks on.  Bed alarm on. [address] Orientation: Patient oriented to staff, room, and unit. Family: Patient's daughter and grandson at bedside  Orders have been reviewed and implemented.  Admission questions completed.  Will continue to monitor.  Arlyss Queen, RN, BSN

## 2015-04-10 NOTE — ED Notes (Signed)
Pt. craweled out and got herself up.

## 2015-04-10 NOTE — Consult Note (Signed)
Renal Service Consult Note [facility]  Bonnye Halle 04/10/2015 Roney Jaffe D Requesting Physician:  Dr Betsey Holiday  Reason for Consult:  ESRD pt missed HD here w AMS HPI: The patient is a 74 y.o. year-old with hx of myeloma 2009 and ESRD since 2014 on HD TTS schedule. Was feeling "bad" this past week with "gas problems" and indigestion.  Missed HD on Thursday and had short HD on Saturday which was yesterday.  Today brought to ED by EMS after fall last night, found this am on the floor.  Left hip pain. Family reporting possible dementia and dehydration due to decreased appetite.    Patient is partially oriented and a poor historian.  Says "gas" caused her to miss HD recently.  Chronic issue.  No sig abd pain, no diarrhea , or vomiting. No CP, sob , cough, fevers , no HA or blurred vision.  Hosp admissions / [facility]: 03/2014 - sent to ED for low Hb on HD labs.  Got 3u prbc's, FOBT +, seen by GI who did not recommend any studies. Anemia d/t myeloma. Moved from [place] where she was followed by [place] for myeloma since 2009.  Seen by ONC and had BM biopsy, dc to home with ONC f/u in clinic.  10/2014 - admitted for HCAP, rx w IV abx vanc/ cefipime, good response. Missed one chemo session while inpatient. Neutropenia resolved. Seen by ONC.  Past Medical History  Past Medical History  Diagnosis Date  . Renal disorder   . ESRD (end stage renal disease)   . Hypertension   . Multiple myeloma 2009  . Thyroid disease   . Hyperparathyroidism   . Thrombocytopenia   . Anemia   . Shortness of breath   . Pneumonia   . GERD (gastroesophageal reflux disease)   . Arthritis     rt knee is stiff  . Multiple myeloma    Past Surgical History  Past Surgical History  Procedure Laterality Date  . Abdominal hysterectomy      Pt. denies   Family History  Family History  Problem Relation Age of Onset  . Hypertension Mother   . Hypertension Father    Social History  reports that she has never  smoked. She has never used smokeless tobacco. She reports that she does not drink alcohol or use illicit drugs. Allergies No Known Allergies Home medications Prior to Admission medications   Medication Sig Start Date End Date Taking? Authorizing Provider  amLODipine (NORVASC) 10 MG tablet Take 0.5 tablets (5 mg total) by mouth daily. 03/21/15  Yes Josalyn Funches, MD  dexamethasone (DECADRON) 4 MG tablet Take 3 tablets (12 mg) on days 1,8,15, and 22 of chemotherapy. 06/14/14  Yes Aasim Lona Kettle, MD  furosemide (LASIX) 20 MG tablet Take 1 tablet (20 mg total) by mouth daily. PRN for leg edema 03/21/15  Yes Josalyn Funches, MD  hydrALAZINE (APRESOLINE) 100 MG tablet Take 100 mg by mouth 3 (three) times daily. 03/25/15  Yes Historical Provider, MD  isosorbide mononitrate (IMDUR) 60 MG 24 hr tablet Take 60 mg by mouth daily. 03/24/15  Yes Historical Provider, MD  Nutritional Supplements (FEEDING SUPPLEMENT, NEPRO CARB STEADY,) LIQD Take 237 mLs by mouth 2 (two) times daily between meals. 10/19/14  Yes Karlene Einstein, MD  ondansetron (ZOFRAN ODT) 4 MG disintegrating tablet Take 1 tablet (4 mg total) by mouth every 8 (eight) hours as needed for nausea or vomiting. 01/21/15  Yes Everlene Balls, MD  pantoprazole (PROTONIX) 40 MG tablet Take 1  tablet (40 mg total) by mouth daily. 02/16/15  Yes Arnoldo Morale, MD  prochlorperazine (COMPAZINE) 10 MG tablet Take 1 tablet (10 mg total) by mouth every 6 (six) hours as needed (Nausea or vomiting). 03/25/14  Yes Concha Norway, MD  traZODone (DESYREL) 50 MG tablet Take 0.5-1 tablets (25-50 mg total) by mouth at bedtime as needed for sleep. 02/23/15  Yes Josalyn Funches, MD  zolpidem (AMBIEN) 5 MG tablet Take 1 tablet (5 mg total) by mouth at bedtime as needed for sleep. 02/11/15  Yes Heath Lark, MD   Liver Function Tests  Recent Labs Lab 04/10/15 1629  AST 36  ALT 13*  ALKPHOS 42  BILITOT 1.2  PROT 10.3*  ALBUMIN 3.4*   No results for input(s): LIPASE, AMYLASE in the last  168 hours. CBC  Recent Labs Lab 04/10/15 1629  WBC 5.7  NEUTROABS 4.1  HGB 14.4  HCT 44.4  MCV 96.9  PLT 510   Basic Metabolic Panel  Recent Labs Lab 04/10/15 1629  NA 129*  K 5.9*  CL 92*  CO2 25  GLUCOSE 77  BUN 63*  CREATININE 11.23*  CALCIUM 10.5*    Filed Vitals:   04/10/15 1630 04/10/15 1645 04/10/15 1700 04/10/15 1745  BP: 136/112 143/108 131/109 130/109  Pulse: 62 67 68   Temp:      TempSrc:      Resp: _0 Height:      Weight:      SpO2: 94% 99% 99% 98%   Exam Elderly AAF, a bit restless, intermittently confused, not in distress No rash, cyanosis or gangrene Sclera anicteric, throat clear +JVD Chest clear bilat no rales or wheezing RRR soft SEM no RG Abd soft ntnd +bs no mass or ascites GU deferred Ext trace LE edema bilat LUA AVF patent +bruit Neuro is mostly oriented at this time x 3, nonfocal  Na 129  K 5.9  BUN 63  Cr 11.2  Ca 10.5 (adj 10.8) Alb 3.4  LFT's ok  TProt high at 10.3 WBC 5k  Hb 14 plts 2012 UA essentially negative   HD: TTS   3h 61mn  LUA AVF  58.5kg  2/2.25 bath  Heparin none Hectorol 10 ug tiw  Assessment: 1. Altered mental status - may be uremic from missing HD, may have underlying dementia per family. No obvious infection, afebrile/ WBC wnl.   2. ESRD missed last 2 HD for the most part 3. Myeloma f/b ONC, started a new chemo regimen this week apparently 4. Anemia Hb normal today 5. Volume looks euvolemic and by wts is 2-3 kg up 6. 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 Hyperkalemia 8. Hyponatremia, usually due to vol excess in esrd pts   Plan- HD tonight and tomorrow  RKelly SplinterMD (pgr) [phone]   (c)[phone]/07/2015, 6:44 PM

## 2015-04-10 NOTE — ED Notes (Signed)
EMS staff stated, fell and c/o left hip pain 0900, able to bare weight and maybe fell out of bed. Pain scale 3/10. Pt. On the floor for a couple of hours.  Family thinks she has early stages of dementia.  Pt. Has had dialysis , Missed Thursday but went Sat.

## 2015-04-10 NOTE — ED Notes (Signed)
Pt arrived by [facility]. Reports being dialysis pt, miss thur treatment and cut Saturday treatment short. Pt fell last night, unsure of what time. Found this am on floor. Reports hitting her head and having left hip pain. Family reports possible dementia and dehydration due to decrease appetite.

## 2015-04-10 NOTE — ED Notes (Signed)
Attempted to get iv X 2.  Provider made aware.  Verbal order given for IV team consult.

## 2015-04-10 NOTE — ED Provider Notes (Signed)
Patient presented to the ER with injuries from a fall. Patient reportedly fell last night and was unable to get back up. She was found on the floor this morning. She was complaining of left hip pain.  Patient's family reports that she has been more confused than usual. There has been a slow and progressive decline since 2014, but this week symptoms have been significantly different. Family reports that she has not been eating or drinking. They're concerned about hydration. She also was just started on Velcade, Cytoxan, Decadron for treatment of multiple myeloma this past week. She has had diagnosis of multiple myeloma since 2009, but these new medications were just started this past week.  Patient has been on dialysis since 2009. She is a Tuesday Thursday Saturday dialysis patient. She had only a partial session yesterday and missed the session before that.  Face to face Exam: HEENT - PERRLA Lungs - CTAB Heart - RRR, no M/R/G Abd - S/NT/ND Neuro - alert, oriented x3  Plan: X-ray of hip is negative. She is able to move the hip without difficulty, no concern for occult fracture. Workup for acute mental status changes.  Orpah Greek, MD 04/10/15 [phone]

## 2015-04-10 NOTE — ED Notes (Signed)
Nephrology at bedside

## 2015-04-10 NOTE — ED Notes (Signed)
Patient transported to X-ray 

## 2015-04-11 DIAGNOSIS — D649 Anemia, unspecified: Secondary | ICD-10-CM | POA: Diagnosis present

## 2015-04-11 DIAGNOSIS — F0391 Unspecified dementia with behavioral disturbance: Secondary | ICD-10-CM | POA: Diagnosis not present

## 2015-04-11 DIAGNOSIS — E43 Unspecified severe protein-calorie malnutrition: Secondary | ICD-10-CM

## 2015-04-11 DIAGNOSIS — E875 Hyperkalemia: Secondary | ICD-10-CM | POA: Diagnosis present

## 2015-04-11 DIAGNOSIS — I120 Hypertensive chronic kidney disease with stage 5 chronic kidney disease or end stage renal disease: Secondary | ICD-10-CM

## 2015-04-11 DIAGNOSIS — R410 Disorientation, unspecified: Secondary | ICD-10-CM | POA: Diagnosis not present

## 2015-04-11 DIAGNOSIS — Z79899 Other long term (current) drug therapy: Secondary | ICD-10-CM | POA: Diagnosis not present

## 2015-04-11 DIAGNOSIS — Z9221 Personal history of antineoplastic chemotherapy: Secondary | ICD-10-CM | POA: Diagnosis not present

## 2015-04-11 DIAGNOSIS — C9000 Multiple myeloma not having achieved remission: Secondary | ICD-10-CM

## 2015-04-11 DIAGNOSIS — Z992 Dependence on renal dialysis: Secondary | ICD-10-CM

## 2015-04-11 DIAGNOSIS — K219 Gastro-esophageal reflux disease without esophagitis: Secondary | ICD-10-CM | POA: Diagnosis present

## 2015-04-11 DIAGNOSIS — I10 Essential (primary) hypertension: Secondary | ICD-10-CM | POA: Diagnosis present

## 2015-04-11 DIAGNOSIS — W06XXXA Fall from bed, initial encounter: Secondary | ICD-10-CM | POA: Diagnosis present

## 2015-04-11 DIAGNOSIS — Z6820 Body mass index (BMI) 20.0-20.9, adult: Secondary | ICD-10-CM | POA: Diagnosis not present

## 2015-04-11 DIAGNOSIS — N2581 Secondary hyperparathyroidism of renal origin: Secondary | ICD-10-CM | POA: Diagnosis present

## 2015-04-11 DIAGNOSIS — N186 End stage renal disease: Secondary | ICD-10-CM | POA: Diagnosis not present

## 2015-04-11 DIAGNOSIS — E871 Hypo-osmolality and hyponatremia: Secondary | ICD-10-CM | POA: Diagnosis present

## 2015-04-11 LAB — URINE CULTURE

## 2015-04-11 LAB — MRSA PCR SCREENING: MRSA by PCR: NEGATIVE

## 2015-04-11 MED ORDER — SODIUM CHLORIDE 0.9 % IV SOLN
100.0000 mL | INTRAVENOUS | Status: DC | PRN
Start: 2015-04-11 — End: 2015-04-11

## 2015-04-11 MED ORDER — LANTHANUM CARBONATE 500 MG PO CHEW
1000.0000 mg | CHEWABLE_TABLET | Freq: Three times a day (TID) | ORAL | Status: DC
  Administered 2015-04-12 – 2015-04-13 (×3): 1000 mg via ORAL
  Filled 2015-04-11 (×8): qty 2

## 2015-04-11 MED ORDER — NEPRO/CARBSTEADY PO LIQD
237.0000 mL | Freq: Every day | ORAL | Status: DC
  Administered 2015-04-13: 237 mL via ORAL

## 2015-04-11 MED ORDER — RENA-VITE PO TABS
1.0000 | ORAL_TABLET | Freq: Every day | ORAL | Status: DC
  Administered 2015-04-11 – 2015-04-12 (×2): 1 via ORAL
  Filled 2015-04-11 (×3): qty 1

## 2015-04-11 MED ORDER — PENTAFLUOROPROP-TETRAFLUOROETH EX AERO: 1.0000 "application " | INHALATION_SPRAY | CUTANEOUS | Status: DC | PRN

## 2015-04-11 MED ORDER — LIDOCAINE HCL (PF) 1 % IJ SOLN: 5.0000 mL | INTRAMUSCULAR | Status: DC | PRN

## 2015-04-11 MED ORDER — CAMPHOR-MENTHOL 0.5-0.5 % EX LOTN
TOPICAL_LOTION | CUTANEOUS | Status: DC | PRN
  Filled 2015-04-11 (×2): qty 222

## 2015-04-11 MED ORDER — HEPARIN SODIUM (PORCINE) 1000 UNIT/ML DIALYSIS: 1000.0000 [IU] | INTRAMUSCULAR | Status: DC | PRN

## 2015-04-11 MED ORDER — ENSURE ENLIVE PO LIQD
237.0000 mL | Freq: Every day | ORAL | Status: DC
  Administered 2015-04-11 – 2015-04-12 (×2): 237 mL via ORAL

## 2015-04-11 MED ORDER — DOXERCALCIFEROL 4 MCG/2ML IV SOLN
6.0000 ug | INTRAVENOUS | Status: DC
  Administered 2015-04-12: 6 ug via INTRAVENOUS
  Filled 2015-04-11: qty 4

## 2015-04-11 MED ORDER — HYDRALAZINE HCL 20 MG/ML IJ SOLN
10.0000 mg | Freq: Once | INTRAMUSCULAR | Status: AC
  Administered 2015-04-11: 10 mg via INTRAVENOUS
  Filled 2015-04-11: qty 1

## 2015-04-11 MED ORDER — LORAZEPAM 2 MG/ML IJ SOLN
0.5000 mg | Freq: Once | INTRAMUSCULAR | Status: AC
  Administered 2015-04-11: 0.5 mg via INTRAVENOUS
  Filled 2015-04-11: qty 1

## 2015-04-11 MED ORDER — SODIUM CHLORIDE 0.9 % IV SOLN: INTRAVENOUS | Status: DC

## 2015-04-11 MED ORDER — ALTEPLASE 2 MG IJ SOLR
2.0000 mg | Freq: Once | INTRAMUSCULAR | Status: DC | PRN
  Filled 2015-04-11: qty 2

## 2015-04-11 MED ORDER — LIDOCAINE-PRILOCAINE 2.5-2.5 % EX CREA: 1.0000 "application " | TOPICAL_CREAM | CUTANEOUS | Status: DC | PRN

## 2015-04-11 MED ORDER — SODIUM CHLORIDE 0.9 % IV SOLN: 100.0000 mL | INTRAVENOUS | Status: DC | PRN

## 2015-04-11 MED ORDER — NEPRO/CARBSTEADY PO LIQD: 237.0000 mL | ORAL | Status: DC | PRN

## 2015-04-11 NOTE — Progress Notes (Addendum)
[facility] Progress Note  Assessment/Plan: 1. AMS - ? Component of uremia vs dementia vs other -  signed off early from her last two HD tmts and skips at times; at baseline has very definite ideas about what she will or will not do. Today she is at baseline..very much the same as how she is when I've seen her at HD 2. ESRD - mild hyperkalemia and hyponatremia yesterday - TTS  - plan extra HD today and back on schedule tuesday 3. Anemia - Hgb 14.4 - holding; had gotten Aranesp 200 when Hgb was 11.8 4. Secondary hyperparathyroidism - on Hectorol 10 with mild hypercalcemia - plan reduce Hectorol dose for now to 6 -- use 2 K 2 Ca bath Monday and Tuesday; last outpt P was 7.7 , 8.7 here- supposed to be on 2 fosrenol and one phoslo ac - resume fosrenol 1 gm ac for now 5. HTN/volume - outpt med list says hydralazine dose is 50 bid, 100 tid here; she cannot tell me what it is- BP generally ^^ at outpt HD- net UF 2 L on Sunday - with post weight of 56.7. Need to titrate volume as tolerated for BP control. 6. Nutrition -renal diet 7. [place], PA-C [facility] Beeper [phone] 04/11/2015,10:04 AM  LOS: 1 day   Pt seen, examined and agree w A/P as above.  Kelly Splinter MD pager [phone]    cell [phone] 04/11/2015, 2:29 PM   Subjective:   States abdominal pain comes and goes. Doesn't know why it happens. None at present. "I don't have any fluid"  Objective Filed Vitals:   04/11/15 0300 04/11/15 0320 04/11/15 0428 04/11/15 0551  BP: 188/117 150/94 191/156 115/93  Pulse: 77 62 77   Temp:  98.2 F (36.8 C) 98.4 F (36.9 C)   TempSrc:  Oral    Resp:  14 19   Height:      Weight:  56.7 kg (125 lb)    SpO2:  100% 96%    Physical Exam General: mildly agitated. Sitting at nursing station. Alert and oriented Heart: RRR Lungs: no rales Abdomen: soft NT Extremities: no LE edema Dialysis Access: left upper AVF + bruit  Dialysis Orders: TTS  3h  59mn LUA AVF 58.5kg 2/2.25 bath Heparin none Hectorol 10 ug tiw  Additional Objective Labs: Basic Metabolic Panel:  Recent Labs Lab 04/10/15 1629 04/10/15 2114  NA 129* 129*  K 5.9* 6.0*  CL 92* 91*  CO2 25 25  GLUCOSE 77 69  BUN 63* 64*  CREATININE 11.23* 11.54*  CALCIUM 10.5* 10.4*  PHOS  --  8.7*   Liver Function Tests:  Recent Labs Lab 04/10/15 1629 04/10/15 2114  AST 36  --   ALT 13*  --   ALKPHOS 42  --   BILITOT 1.2  --   PROT 10.3*  --   ALBUMIN 3.4* 3.5   CBC:  Recent Labs Lab 04/10/15 1629  WBC 5.7  NEUTROABS 4.1  HGB 14.4  HCT 44.4  MCV 96.9  PLT 201  Cardiac Enzymes:  Recent Labs Lab 04/10/15 1629  CKTOTAL 268*  Studies/Results: Ct Head Wo Contrast  04/10/2015   CLINICAL DATA:  FGolden Circleout of bed and hit head today.  EXAM: CT HEAD WITHOUT CONTRAST  CT CERVICAL SPINE WITHOUT CONTRAST  TECHNIQUE: Multidetector CT imaging of the head and cervical spine was performed following the standard protocol without intravenous contrast. Multiplanar CT image reconstructions of the cervical spine  were also generated.  COMPARISON:  None.  FINDINGS: CT HEAD FINDINGS  The calvarium is thickened in there are scattered lucencies in the die PleurX space. Patient has multiple myeloma. No acute fracture. There are age related changes of cerebral atrophy, ventriculomegaly and periventricular white matter disease. No acute intracranial findings such as hemispheric infarction an or intracranial hemorrhage. No extra-axial fluid collections are identified. The brainstem and cerebellum are grossly normal.  The paranasal sinuses and mastoid air cells are clear. The globes are intact.  CT CERVICAL SPINE FINDINGS  Degenerative cervical spondylosis with multilevel disc disease and facet disease but no acute cervical spine fracture. Scattered lucencies consistent with myelomatous lesions. The skullbase C1 and C1-2 articulations are maintained. The dens is intact. Multilevel foraminal  stenosis due to uncinate spurring and facet disease. Carotid artery calcifications are noted. The visualized lung apices are grossly clear.  IMPRESSION: No acute intracranial findings or skull fracture.  Degenerative cervical spondylosis with multilevel disc disease and facet disease but no acute cervical spine fracture.  Multiple myelomatous bone lesions but no pathologic fracture.   Electronically Signed   By: Marijo Sanes M.D.   On: 04/10/2015 16:21   Ct Cervical Spine Wo Contrast  04/10/2015   CLINICAL DATA:  [place] Circle out of bed and hit head today.  EXAM: CT HEAD WITHOUT CONTRAST  CT CERVICAL SPINE WITHOUT CONTRAST  TECHNIQUE: Multidetector CT imaging of the head and cervical spine was performed following the standard protocol without intravenous contrast. Multiplanar CT image reconstructions of the cervical spine were also generated.  COMPARISON:  None.  FINDINGS: CT HEAD FINDINGS  The calvarium is thickened in there are scattered lucencies in the die PleurX space. Patient has multiple myeloma. No acute fracture. There are age related changes of cerebral atrophy, ventriculomegaly and periventricular white matter disease. No acute intracranial findings such as hemispheric infarction an or intracranial hemorrhage. No extra-axial fluid collections are identified. The brainstem and cerebellum are grossly normal.  The paranasal sinuses and mastoid air cells are clear. The globes are intact.  CT CERVICAL SPINE FINDINGS  Degenerative cervical spondylosis with multilevel disc disease and facet disease but no acute cervical spine fracture. Scattered lucencies consistent with myelomatous lesions. The skullbase C1 and C1-2 articulations are maintained. The dens is intact. Multilevel foraminal stenosis due to uncinate spurring and facet disease. Carotid artery calcifications are noted. The visualized lung apices are grossly clear.  IMPRESSION: No acute intracranial findings or skull fracture.  Degenerative cervical  spondylosis with multilevel disc disease and facet disease but no acute cervical spine fracture.  Multiple myelomatous bone lesions but no pathologic fracture.   Electronically Signed   By: Marijo Sanes M.D.   On: 04/10/2015 16:21   Dg Abd Portable 1v  04/10/2015   CLINICAL DATA:  Acute abdominal pain and nausea.  EXAM: PORTABLE ABDOMEN - 1 VIEW  COMPARISON:  01/21/2015 CT  FINDINGS: Gas in nondistended colon and small bowel noted. No dilated bowel loops are present. No suspicious calcifications are identified. No acute bony abnormalities are noted.  IMPRESSION: Unremarkable bowel gas pattern.   Electronically Signed   By: Margarette Canada M.D.   On: 04/10/2015 16:02   Dg Hip Unilat With Pelvis 2-3 Views Left  04/10/2015   CLINICAL DATA:  Fall in bedroom this morning. Left hip injury and pain. Multiple myeloma. Initial encounter.  EXAM: DG HIP (WITH OR WITHOUT PELVIS) 2-3V LEFT  COMPARISON:  None.  FINDINGS: There is no evidence of hip fracture or dislocation. There  is no evidence of hip joint arthropathy or focal lytic or sclerotic bone lesions.  IMPRESSION: Negative.   Electronically Signed   By: Earle Gell M.D.   On: 04/10/2015 14:21   Medications:   . amLODipine  5 mg Oral Daily  . cloNIDine  0.1 mg Oral Once  . heparin  5,000 Units Subcutaneous 3 times per day  . hydrALAZINE  100 mg Oral TID  . isosorbide mononitrate  60 mg Oral Daily  . pantoprazole  40 mg Oral Daily  . sodium chloride  3 mL Intravenous Q12H

## 2015-04-11 NOTE — Progress Notes (Signed)
PROGRESS NOTE  [name] ZES:[id number] DOB: [birth year] DOA: 04/10/2015 PCP: Minerva Ends, MD  HPI/Recap of past 58 hours: 74 year old female with past medical history of protein calorie malnutrition, multiple myeloma and end-stage renal disease brought in by her daughter for fall and confusion. Apparently patient lives alone and has been having episodes of missing hemodialysis intermittently as well as getting more forgetful. Lab work on admission was unrevealing other than labs consistent with end stage renal disease.  When I saw patient she was quite confused oriented 1 only. She did not voice any focal complaints. She did undergo dialysis successfully following.   Assessment/Plan: Active Problems:   ESRD (end stage renal disease): Continue with a [facility], see concerns below   Multiple myeloma   Hypertension associatd with end stage renal disease on dialysis   Protein-calorie malnutrition, severe: Patient needs criteria in the context of chronic illness. Seen by nutrition. Started on ensure and Nepro shake   Altered mental state: Suspect that this is in part uremia, but with an underlying dementia process. I'm concerned about the patient going home she cannot clearly care for herself. Left message with daughter, but we need to look at placement options   Fall: Likely secondary to uremia from missing dialysis   Code Status: Full code  Family Communication: Left message with daughter  Disposition Plan: As above, need to determine possible placement options   Consultants:  Nephrology  Procedures:  Dialysis 7/11  Antibiotics:  None   Objective: BP 166/74 mmHg  Pulse 75  Temp(Src) 98 F (36.7 C) (Oral)  Resp 18  Ht 5' 6" (1.676 m)  Wt 56.7 kg (125 lb)  BMI 20.19 kg/m2  SpO2 97%  Intake/Output Summary (Last 24 hours) at 04/11/15 1858 Last data filed at 04/11/15 1747  Gross per 24 hour  Intake    363 ml  Output   2000 ml  Net  -1637 ml   Filed Weights     04/10/15 1244 04/10/15 2218 04/11/15 0320  Weight: 61.689 kg (136 lb) 59.693 kg (131 lb 9.6 oz) 56.7 kg (125 lb)    Exam:   General:  Oriented 1, no acute distress  Cardiovascular: Regular rate and rhythm, S1-S2  Respiratory: Clear to auscultation bilaterally  Abdomen: Soft, nontender, nondistended, positive bowel sounds  Musculoskeletal: No clubbing cyanosis or edema   Data Reviewed: Basic Metabolic Panel:  Recent Labs Lab 04/10/15 1629 04/10/15 2114  NA 129* 129*  K 5.9* 6.0*  CL 92* 91*  CO2 25 25  GLUCOSE 77 69  BUN 63* 64*  CREATININE 11.23* 11.54*  CALCIUM 10.5* 10.4*  PHOS  --  8.7*   Liver Function Tests:  Recent Labs Lab 04/10/15 1629 04/10/15 2114  AST 36  --   ALT 13*  --   ALKPHOS 42  --   BILITOT 1.2  --   PROT 10.3*  --   ALBUMIN 3.4* 3.5   No results for input(s): LIPASE, AMYLASE in the last 168 hours. No results for input(s): AMMONIA in the last 168 hours. CBC:  Recent Labs Lab 04/10/15 1629  WBC 5.7  NEUTROABS 4.1  HGB 14.4  HCT 44.4  MCV 96.9  PLT 201   Cardiac Enzymes:    Recent Labs Lab 04/10/15 1629  CKTOTAL 268*   BNP (last 3 results) No results for input(s): BNP in the last 8760 hours.  ProBNP (last 3 results) No results for input(s): PROBNP in the last 8760 hours.  CBG: No results for  input(s): GLUCAP in the last 168 hours.  Recent Results (from the past 240 hour(s))  Urine culture     Status: None   Collection Time: 04/10/15  6:00 PM  Result Value Ref Range Status   Specimen Description URINE, CLEAN CATCH  Final   Special Requests NONE  Final   Culture   Final    MULTIPLE SPECIES PRESENT, SUGGEST RECOLLECTION IF CLINICALLY INDICATED   Report Status 04/11/2015 FINAL  Final  MRSA PCR Screening     Status: None   Collection Time: 04/11/15  2:11 AM  Result Value Ref Range Status   MRSA by PCR NEGATIVE NEGATIVE Final    Comment:        The GeneXpert MRSA Assay (FDA approved for NASAL specimens only),  is one component of a comprehensive MRSA colonization surveillance program. It is not intended to diagnose MRSA infection nor to guide or monitor treatment for MRSA infections.      Studies: No results found.  Scheduled Meds: . amLODipine  5 mg Oral Daily  . cloNIDine  0.1 mg Oral Once  . [START ON 04/12/2015] doxercalciferol  6 mcg Intravenous Q T,Th,Sa-HD  . feeding supplement (ENSURE ENLIVE)  237 mL Oral Q1500  . [START ON 04/12/2015] feeding supplement (NEPRO CARB STEADY)  237 mL Oral Daily  . heparin  5,000 Units Subcutaneous 3 times per day  . hydrALAZINE  100 mg Oral TID  . isosorbide mononitrate  60 mg Oral Daily  . lanthanum  1,000 mg Oral TID WC  . multivitamin  1 tablet Oral QHS  . pantoprazole  40 mg Oral Daily  . sodium chloride  3 mL Intravenous Q12H    Continuous Infusions:    Time spent: 15 minutes  [facility] Pager [phone]. If 7PM-7AM, please contact night-coverage at [email or web address], password [facility] 04/11/2015, 6:58 PM  LOS: 1 day

## 2015-04-11 NOTE — Progress Notes (Signed)
Patient just returned from dialysis this early morning.  2 liters removed per HD RN.  Upon return to unit, BP 191/156.  Patient also scheduled to receive dialysis later this afternoon, but has scheduled BP medications due at this time to give.  Notified Raliegh Ip Schorr NP of situation.  Verbal orders received to hold current BP medications for now and to administer 10 mg hydralazine IV once.  Will follow-up with NP after administration and BP recheck.  Will continue to monitor.

## 2015-04-11 NOTE — Progress Notes (Signed)
Initial Nutrition Assessment  DOCUMENTATION CODES:  Severe malnutrition in context of chronic illness  INTERVENTION:  Ensure Enlive (each supplement provides 350kcal and 20 grams of protein), Nepro shake   Encourage adequate PO intake.   NUTRITION DIAGNOSIS:  Malnutrition related to chronic illness as evidenced by severe depletion of body fat, severe depletion of muscle mass, percent weight loss.  GOAL:  Patient will meet greater than or equal to 90% of their needs  MONITOR:  PO intake, Supplement acceptance, Weight trends, Labs, I & O's  REASON FOR ASSESSMENT:  Malnutrition Screening Tool    ASSESSMENT: Pt with history of HTN, ESRD on HD, MM s/p chemo (started last week), thyroid disease, GERD who was brought by her daughter with cc of fall and altered mental status.  Pt reports having a good appetite. Diet has been advanced to a heart healthy diet. Pt reports she will try to consume most of her food when dinner comes. PTA pt reports usually consuming 2-3 meals a day along with an Ensure shake once daily. Pt does endorse weight loss. Per Epic weight records, pt with a 5.3% weight loss in 1 month. Pt was offered Nepro shake to aid in caloric and protein needs and educated how Nepro was more kidney friendly than ensure, however pt reports liking Ensure better. RD to order. Pt was encouraged to eat her food at meals and to drink her supplements.   Nutrition-Focused physical exam completed. Findings are severe fat depletion, severe muscle depletion, and mild edema.   Labs: Low sodium, chloride, and GFR. High BUN, creatinine, potassium (6.0), and phosphorous (8.7).  Height:  Ht Readings from Last 1 Encounters:  04/10/15 5\' 6"  (1.676 m)    Weight:  Wt Readings from Last 1 Encounters:  04/11/15 125 lb (56.7 kg)    Ideal Body Weight:  59 kg  Wt Readings from Last 10 Encounters:  04/11/15 125 lb (56.7 kg)  03/21/15 132 lb 3.2 oz (59.966 kg)  03/18/15 137 lb (62.143 kg)   03/04/15 133 lb 6.4 oz (60.51 kg)  02/25/15 132 lb (59.875 kg)  02/23/15 135 lb (61.236 kg)  02/18/15 136 lb 12.8 oz (62.052 kg)  02/16/15 136 lb (61.689 kg)  02/04/15 140 lb (63.504 kg)  01/28/15 130 lb 12.8 oz (59.33 kg)    BMI:  Body mass index is 20.19 kg/(m^2).  Estimated Nutritional Needs:  Kcal:  1700-1900  Protein:  75-95 grams  Fluid:  Per MD  Skin:   (non-pitting RLE, +1 LLE edema)  Diet Order:  Diet Heart Room service appropriate?: Yes; Fluid consistency:: Thin  EDUCATION NEEDS:  Education needs addressed   Intake/Output Summary (Last 24 hours) at 04/11/15 1612 Last data filed at 04/11/15 0900  Gross per 24 hour  Intake      3 ml  Output   2000 ml  Net  -1997 ml    Last BM:  PTA  Corrin Parker, MS, RD, LDN Pager # [phone] After hours/ weekend pager # [phone]

## 2015-04-11 NOTE — Progress Notes (Signed)
Chaplain responded to consult  Spoke with nurse, patient sleeping.   Will follow-up as needed.   04/11/15 1000  Clinical Encounter Type  Visited With Patient;Health care provider  Visit Type Initial;Spiritual support  Spiritual Encounters  Spiritual Needs Prayer;Emotional

## 2015-04-11 NOTE — Progress Notes (Signed)
Patient is extremely anxious and agitated.  Sitting up in bed and is tearful.  States that "dialysis is killing her and her memory." Retail banker NP.  New orders received for ativan 0.5 mg IV one time dose.  Will administer and continue to monitor.

## 2015-04-11 NOTE — H&P (Addendum)
History and Physical  [name] QMV:[id number] DOB: [birth year] DOA: 04/10/2015  PCP: Minerva Ends, MD   Chief Complaint: fall, AMS.  HPI: [name] is a 74 y.o. female with history of HTN, ESRD on HD, MM s/p chemo, thyroid disease, GERD who was brought by her daughter with cc of fall and altered mental status. Patient was alert , O#3 when I saw her. But still can't provide history.  She lives by herself. She was trying to get up and fell on the ground last night. She was found today in am. She denied having dizziness or chest pain.  She said she has been having tingling in her feet and feels unsteady. When she fell she hit her head. Daughter said patient has been getting more forgetful and missed dialysis days. She missed Thursday but went Saturday to HD. She said this has been going on for 3 years but has been worsening recently. This acute change was over a week ago as patient went to [facility] and she got started on treatment for MM. Patient has dry cough and mild nausea. Otherwise she denies chest pain, abdominal pain, fever, chills, blood stool, dyspnea. Patient complains of lower back pain and left hip pain.     Review of Systems:  12 point review of systems is otherwise negative except as in HPI.   Past Medical History  Diagnosis Date  . Renal disorder   . ESRD (end stage renal disease)   . Hypertension   . Multiple myeloma 2009  . Thyroid disease   . Hyperparathyroidism   . Thrombocytopenia   . Anemia   . Shortness of breath   . Pneumonia   . GERD (gastroesophageal reflux disease)   . Arthritis     rt knee is stiff  . Multiple myeloma    Past Surgical History  Procedure Laterality Date  . Abdominal hysterectomy      Pt. denies   Social History:  reports that she has never smoked. She has never used smokeless tobacco. She reports that she does not drink alcohol or use illicit drugs. Patient lives at home & is able to participate in activities of daily living by  herself but has been getting more forgetful and dependent recently.   No Known Allergies  Family History  Problem Relation Age of Onset  . Hypertension Mother   . Hypertension Father      Prior to Admission medications   Medication Sig Start Date End Date Taking? Authorizing Provider  amLODipine (NORVASC) 10 MG tablet Take 0.5 tablets (5 mg total) by mouth daily. 03/21/15  Yes Josalyn Funches, MD  dexamethasone (DECADRON) 4 MG tablet Take 3 tablets (12 mg) on days 1,8,15, and 22 of chemotherapy. 06/14/14  Yes Aasim Lona Kettle, MD  furosemide (LASIX) 20 MG tablet Take 1 tablet (20 mg total) by mouth daily. PRN for leg edema 03/21/15  Yes Josalyn Funches, MD  hydrALAZINE (APRESOLINE) 100 MG tablet Take 100 mg by mouth 3 (three) times daily. 03/25/15  Yes Historical Provider, MD  isosorbide mononitrate (IMDUR) 60 MG 24 hr tablet Take 60 mg by mouth daily. 03/24/15  Yes Historical Provider, MD  Nutritional Supplements (FEEDING SUPPLEMENT, NEPRO CARB STEADY,) LIQD Take 237 mLs by mouth 2 (two) times daily between meals. 10/19/14  Yes Karlene Einstein, MD  ondansetron (ZOFRAN ODT) 4 MG disintegrating tablet Take 1 tablet (4 mg total) by mouth every 8 (eight) hours as needed for nausea or vomiting. 01/21/15  Yes Everlene Balls, MD  pantoprazole (PROTONIX) 40 MG tablet Take 1 tablet (40 mg total) by mouth daily. 02/16/15  Yes Arnoldo Morale, MD  prochlorperazine (COMPAZINE) 10 MG tablet Take 1 tablet (10 mg total) by mouth every 6 (six) hours as needed (Nausea or vomiting). 03/25/14  Yes Concha Norway, MD  traZODone (DESYREL) 50 MG tablet Take 0.5-1 tablets (25-50 mg total) by mouth at bedtime as needed for sleep. 02/23/15  Yes Josalyn Funches, MD  zolpidem (AMBIEN) 5 MG tablet Take 1 tablet (5 mg total) by mouth at bedtime as needed for sleep. 02/11/15  Yes Heath Lark, MD    Physical Exam: BP 175/104 mmHg  Pulse 67  Temp(Src) 98.3 F (36.8 C) (Oral)  Resp 18  Ht '5\' 6"'  (1.676 m)  Wt 59.693 kg (131 lb 9.6 oz)  BMI  21.25 kg/m2  SpO2 100%  General:  In NAD. Eyes: non icteric.  ENT: normal oral and nasal mucosa. Neck: supple Cardiovascular: RRR. No M/G/R. Respiratory: CTAB Abdomen: soft, non tender, BS+ Musculoskeletal: mild tenderness on palpation of lower back.  Neurologic: alert, O#3, no focal deficits.           Labs on Admission:  Basic Metabolic Panel:  Recent Labs Lab 04/10/15 1629 04/10/15 2114  NA 129* 129*  K 5.9* 6.0*  CL 92* 91*  CO2 25 25  GLUCOSE 77 69  BUN 63* 64*  CREATININE 11.23* 11.54*  CALCIUM 10.5* 10.4*  PHOS  --  8.7*   Liver Function Tests:  Recent Labs Lab 04/10/15 1629 04/10/15 2114  AST 36  --   ALT 13*  --   ALKPHOS 42  --   BILITOT 1.2  --   PROT 10.3*  --   ALBUMIN 3.4* 3.5   No results for input(s): LIPASE, AMYLASE in the last 168 hours. No results for input(s): AMMONIA in the last 168 hours. CBC:  Recent Labs Lab 04/10/15 1629  WBC 5.7  NEUTROABS 4.1  HGB 14.4  HCT 44.4  MCV 96.9  PLT 201   Cardiac Enzymes:  Recent Labs Lab 04/10/15 1629  CKTOTAL 268*    BNP (last 3 results) No results for input(s): BNP in the last 8760 hours.  ProBNP (last 3 results) No results for input(s): PROBNP in the last 8760 hours.  CBG: No results for input(s): GLUCAP in the last 168 hours.  Radiological Exams on Admission: Ct Head Wo Contrast  04/10/2015   CLINICAL DATA:  [place] out of bed and hit head today.  EXAM: CT HEAD WITHOUT CONTRAST  CT CERVICAL SPINE WITHOUT CONTRAST  TECHNIQUE: Multidetector CT imaging of the head and cervical spine was performed following the standard protocol without intravenous contrast. Multiplanar CT image reconstructions of the cervical spine were also generated.  COMPARISON:  None.  FINDINGS: CT HEAD FINDINGS  The calvarium is thickened in there are scattered lucencies in the die PleurX space. Patient has multiple myeloma. No acute fracture. There are age related changes of cerebral atrophy, ventriculomegaly and  periventricular white matter disease. No acute intracranial findings such as hemispheric infarction an or intracranial hemorrhage. No extra-axial fluid collections are identified. The brainstem and cerebellum are grossly normal.  The paranasal sinuses and mastoid air cells are clear. The globes are intact.  CT CERVICAL SPINE FINDINGS  Degenerative cervical spondylosis with multilevel disc disease and facet disease but no acute cervical spine fracture. Scattered lucencies consistent with myelomatous lesions. The skullbase C1 and C1-2 articulations are maintained. The dens is intact. Multilevel foraminal stenosis due to uncinate  spurring and facet disease. Carotid artery calcifications are noted. The visualized lung apices are grossly clear.  IMPRESSION: No acute intracranial findings or skull fracture.  Degenerative cervical spondylosis with multilevel disc disease and facet disease but no acute cervical spine fracture.  Multiple myelomatous bone lesions but no pathologic fracture.   Electronically Signed   By: Marijo Sanes M.D.   On: 04/10/2015 16:21   Ct Cervical Spine Wo Contrast  04/10/2015   CLINICAL DATA:  [place] out of bed and hit head today.  EXAM: CT HEAD WITHOUT CONTRAST  CT CERVICAL SPINE WITHOUT CONTRAST  TECHNIQUE: Multidetector CT imaging of the head and cervical spine was performed following the standard protocol without intravenous contrast. Multiplanar CT image reconstructions of the cervical spine were also generated.  COMPARISON:  None.  FINDINGS: CT HEAD FINDINGS  The calvarium is thickened in there are scattered lucencies in the die PleurX space. Patient has multiple myeloma. No acute fracture. There are age related changes of cerebral atrophy, ventriculomegaly and periventricular white matter disease. No acute intracranial findings such as hemispheric infarction an or intracranial hemorrhage. No extra-axial fluid collections are identified. The brainstem and cerebellum are grossly normal.  The  paranasal sinuses and mastoid air cells are clear. The globes are intact.  CT CERVICAL SPINE FINDINGS  Degenerative cervical spondylosis with multilevel disc disease and facet disease but no acute cervical spine fracture. Scattered lucencies consistent with myelomatous lesions. The skullbase C1 and C1-2 articulations are maintained. The dens is intact. Multilevel foraminal stenosis due to uncinate spurring and facet disease. Carotid artery calcifications are noted. The visualized lung apices are grossly clear.  IMPRESSION: No acute intracranial findings or skull fracture.  Degenerative cervical spondylosis with multilevel disc disease and facet disease but no acute cervical spine fracture.  Multiple myelomatous bone lesions but no pathologic fracture.   Electronically Signed   By: Marijo Sanes M.D.   On: 04/10/2015 16:21   Dg Abd Portable 1v  04/10/2015   CLINICAL DATA:  Acute abdominal pain and nausea.  EXAM: PORTABLE ABDOMEN - 1 VIEW  COMPARISON:  01/21/2015 CT  FINDINGS: Gas in nondistended colon and small bowel noted. No dilated bowel loops are present. No suspicious calcifications are identified. No acute bony abnormalities are noted.  IMPRESSION: Unremarkable bowel gas pattern.   Electronically Signed   By: Margarette Canada M.D.   On: 04/10/2015 16:02   Dg Hip Unilat With Pelvis 2-3 Views Left  04/10/2015   CLINICAL DATA:  Fall in bedroom this morning. Left hip injury and pain. Multiple myeloma. Initial encounter.  EXAM: DG HIP (WITH OR WITHOUT PELVIS) 2-3V LEFT  COMPARISON:  None.  FINDINGS: There is no evidence of hip fracture or dislocation. There is no evidence of hip joint arthropathy or focal lytic or sclerotic bone lesions.  IMPRESSION: Negative.   Electronically Signed   By: Earle Gell M.D.   On: 04/10/2015 14:21    EKG: Independently reviewed. No ST or T ischemic changes.   Assessment/Plan  Altered mental status:  Patient likely has dementia that has been progressing ( per daughter  history) No acute change in mental status noted today.  Fluctuating in mental status could be due to uremia due to missing HD, infection or chemo UA with leukocytes w/o bacterial, LA was WNL.  CT head : unremarkable  Fall: Mechanical vs. AMS  Needs PT/OT in am CT head unremarkable Pelvis Xray w/o fracture.  ESRD on HD: Patient admitted for HD  Nephrology consulted K at  5.9 HD tonight and tmw per Nephrology.  Repeat labs after HD  MM:  F/u at [facility]. Started on chemo last week.  HTN:  continue home meds  Code Status: full  Family Communication: daughter at beside.    Disposition Plan: admit to obs.    Gennaro Africa MD [facility]

## 2015-04-11 NOTE — Progress Notes (Signed)
Patient refusing labs this morning.  Notified Schorr NP.  Will continue to monitor.

## 2015-04-12 DIAGNOSIS — F0391 Unspecified dementia with behavioral disturbance: Principal | ICD-10-CM

## 2015-04-12 LAB — RENAL FUNCTION PANEL
Albumin: 2.7 g/dL — ABNORMAL LOW (ref 3.5–5.0)
Anion gap: 10 (ref 5–15)
BUN: 44 mg/dL — ABNORMAL HIGH (ref 6–20)
CO2: 27 mmol/L (ref 22–32)
Calcium: 10.4 mg/dL — ABNORMAL HIGH (ref 8.9–10.3)
Chloride: 93 mmol/L — ABNORMAL LOW (ref 101–111)
Creatinine, Ser: 9.05 mg/dL — ABNORMAL HIGH (ref 0.44–1.00)
GFR calc Af Amer: 4 mL/min — ABNORMAL LOW (ref >60)
GFR calc non Af Amer: 4 mL/min — ABNORMAL LOW (ref >60)
Glucose, Bld: 105 mg/dL — ABNORMAL HIGH (ref 65–99)
Phosphorus: 7.6 mg/dL — ABNORMAL HIGH (ref 2.5–4.6)
Potassium: 4.1 mmol/L (ref 3.5–5.1)
Sodium: 130 mmol/L — ABNORMAL LOW (ref 135–145)

## 2015-04-12 LAB — CBC
HCT: 34.5 % — ABNORMAL LOW (ref 36.0–46.0)
Hemoglobin: 10.8 g/dL — ABNORMAL LOW (ref 12.0–15.0)
MCH: 30.2 pg (ref 26.0–34.0)
MCHC: 31.3 g/dL (ref 30.0–36.0)
MCV: 96.4 fL (ref 78.0–100.0)
Platelets: 178 10*3/uL (ref 150–400)
RBC: 3.58 MIL/uL — ABNORMAL LOW (ref 3.87–5.11)
RDW: 14.3 % (ref 11.5–15.5)
WBC: 4.5 10*3/uL (ref 4.0–10.5)

## 2015-04-12 MED ORDER — PENTAFLUOROPROP-TETRAFLUOROETH EX AERO: 1.0000 "application " | INHALATION_SPRAY | CUTANEOUS | Status: DC | PRN

## 2015-04-12 MED ORDER — ZOLPIDEM TARTRATE 5 MG PO TABS
5.0000 mg | ORAL_TABLET | Freq: Once | ORAL | Status: AC
  Administered 2015-04-12: 5 mg via ORAL
  Filled 2015-04-12: qty 1

## 2015-04-12 MED ORDER — ALTEPLASE 2 MG IJ SOLR
2.0000 mg | Freq: Once | INTRAMUSCULAR | Status: DC | PRN
  Filled 2015-04-12: qty 2

## 2015-04-12 MED ORDER — SODIUM CHLORIDE 0.9 % IV SOLN: 100.0000 mL | INTRAVENOUS | Status: DC | PRN

## 2015-04-12 MED ORDER — LIDOCAINE-PRILOCAINE 2.5-2.5 % EX CREA: 1.0000 "application " | TOPICAL_CREAM | CUTANEOUS | Status: DC | PRN

## 2015-04-12 MED ORDER — DIPHENHYDRAMINE HCL 50 MG/ML IJ SOLN
INTRAMUSCULAR | Status: AC
  Filled 2015-04-12: qty 1

## 2015-04-12 MED ORDER — LIDOCAINE HCL (PF) 1 % IJ SOLN: 5.0000 mL | INTRAMUSCULAR | Status: DC | PRN

## 2015-04-12 MED ORDER — DOXERCALCIFEROL 4 MCG/2ML IV SOLN
INTRAVENOUS | Status: AC
  Filled 2015-04-12: qty 4

## 2015-04-12 MED ORDER — NEPRO/CARBSTEADY PO LIQD
237.0000 mL | ORAL | Status: DC | PRN
  Filled 2015-04-12: qty 237

## 2015-04-12 MED ORDER — DIPHENHYDRAMINE HCL 50 MG/ML IJ SOLN
25.0000 mg | Freq: Once | INTRAMUSCULAR | Status: AC
  Administered 2015-04-12: 25 mg via INTRAVENOUS

## 2015-04-12 MED ORDER — LORAZEPAM 2 MG/ML IJ SOLN
1.0000 mg | Freq: Once | INTRAMUSCULAR | Status: AC
  Administered 2015-04-12: 1 mg via INTRAVENOUS
  Filled 2015-04-12: qty 1

## 2015-04-12 NOTE — Progress Notes (Signed)
Patient very restless. Unable to stay in bed. Attempting to get OOB without assistance. Staff has redirected patient several times. Notified Lamar Blinks, NP. New orders received for Ambien 5mg  PO. Will administer. Staff will continue to monitor.

## 2015-04-12 NOTE — Progress Notes (Signed)
PROGRESS NOTE  [name] OMV:[id number] DOB: 28-Jan-1941 DOA: 04/10/2015 PCP: Minerva Ends, MD  HPI/Recap of past 83 hours: 74 year old female with past medical history of protein calorie malnutrition, multiple myeloma and end-stage renal disease brought in by her daughter for fall and confusion. Apparently patient lives alone and has been having episodes of missing hemodialysis intermittently as well as getting more forgetful. Lab work on admission was unrevealing other than labs consistent with end stage renal disease.  When I saw patient she was quite confused oriented 1 only. She did not voice any focal complaints. She did undergo dialysis successfully following. Patient to little bit more calm today. Still some underlying confusion. Seen by PT who recommended skilled nursing. I had an extensive discussion with her daughter who agrees that she cannot care for herself. Placement  Assessment/Plan: Active Problems:   ESRD (end stage renal disease): Status post dialysis, see concerns below   Multiple myeloma   Hypertension associatd with end stage renal disease on dialysis: Pressure still elevated   Protein-calorie malnutrition, severe: Patient needs criteria in the context of chronic illness. Seen by nutrition. Started on ensure and Nepro shake   Altered mental state: Suspect that this is in part uremia, but with an underlying dementia process. I'm concerned about the patient going home she cannot clearly care for , which daughter agrees. Plans for long-term placement currently undertaken    Fall: Likely secondary to uremia from missing dialysis   Code Status: Full code  Family Communication: Spoke with daughter by phone  Disposition Plan: Hopefully discharge tomorrow to skilled nursing   Consultants:  Nephrology  Procedures:  Dialysis 7/11  Antibiotics:  None   Objective: BP 163/111 mmHg  Pulse 65  Temp(Src) 98.1 F (36.7 C) (Oral)  Resp 16  Ht '5\' 6"'  (1.676  m)  Wt 55.7 kg (122 lb 12.7 oz)  BMI 19.83 kg/m2  SpO2 96%  Intake/Output Summary (Last 24 hours) at 04/12/15 1615 Last data filed at 04/12/15 1512  Gross per 24 hour  Intake    600 ml  Output   1509 ml  Net   -909 ml   Filed Weights   04/11/15 2100 04/12/15 1000 04/12/15 1406  Weight: 56.4 kg (124 lb 5.4 oz) 57.2 kg (126 lb 1.7 oz) 55.7 kg (122 lb 12.7 oz)    Exam:   General:  Oriented 1, no acute distress, fatigued  Cardiovascular: Regular rate and rhythm, S1-S2  Respiratory: Clear to auscultation bilaterally  Abdomen: Soft, nontender, nondistended, positive bowel sounds  Musculoskeletal: No clubbing cyanosis or edema   Data Reviewed: Basic Metabolic Panel:  Recent Labs Lab 04/10/15 1629 04/10/15 2114 04/12/15 1104  NA 129* 129* 130*  K 5.9* 6.0* 4.1  CL 92* 91* 93*  CO2 '25 25 27  ' GLUCOSE 77 69 105*  BUN 63* 64* 44*  CREATININE 11.23* 11.54* 9.05*  CALCIUM 10.5* 10.4* 10.4*  PHOS  --  8.7* 7.6*   Liver Function Tests:  Recent Labs Lab 04/10/15 1629 04/10/15 2114 04/12/15 1104  AST 36  --   --   ALT 13*  --   --   ALKPHOS 42  --   --   BILITOT 1.2  --   --   PROT 10.3*  --   --   ALBUMIN 3.4* 3.5 2.7*   No results for input(s): LIPASE, AMYLASE in the last 168 hours. No results for input(s): AMMONIA in the last 168 hours. CBC:  Recent Labs Lab 04/10/15 1629  04/12/15 1105  WBC 5.7 4.5  NEUTROABS 4.1  --   HGB 14.4 10.8*  HCT 44.4 34.5*  MCV 96.9 96.4  PLT 201 178   Cardiac Enzymes:    Recent Labs Lab 04/10/15 1629  CKTOTAL 268*   BNP (last 3 results) No results for input(s): BNP in the last 8760 hours.  ProBNP (last 3 results) No results for input(s): PROBNP in the last 8760 hours.  CBG: No results for input(s): GLUCAP in the last 168 hours.  Recent Results (from the past 240 hour(s))  Urine culture     Status: None   Collection Time: 04/10/15  6:00 PM  Result Value Ref Range Status   Specimen Description URINE, CLEAN  CATCH  Final   Special Requests NONE  Final   Culture   Final    MULTIPLE SPECIES PRESENT, SUGGEST RECOLLECTION IF CLINICALLY INDICATED   Report Status 04/11/2015 FINAL  Final  MRSA PCR Screening     Status: None   Collection Time: 04/11/15  2:11 AM  Result Value Ref Range Status   MRSA by PCR NEGATIVE NEGATIVE Final    Comment:        The GeneXpert MRSA Assay (FDA approved for NASAL specimens only), is one component of a comprehensive MRSA colonization surveillance program. It is not intended to diagnose MRSA infection nor to guide or monitor treatment for MRSA infections.      Studies: No results found.  Scheduled Meds: . amLODipine  5 mg Oral Daily  . cloNIDine  0.1 mg Oral Once  . diphenhydrAMINE      . doxercalciferol      . doxercalciferol  6 mcg Intravenous Q T,Th,Sa-HD  . feeding supplement (ENSURE ENLIVE)  237 mL Oral Q1500  . feeding supplement (NEPRO CARB STEADY)  237 mL Oral Daily  . heparin  5,000 Units Subcutaneous 3 times per day  . hydrALAZINE  100 mg Oral TID  . isosorbide mononitrate  60 mg Oral Daily  . lanthanum  1,000 mg Oral TID WC  . multivitamin  1 tablet Oral QHS  . pantoprazole  40 mg Oral Daily  . sodium chloride  3 mL Intravenous Q12H    Continuous Infusions:    Time spent: 15 minutes  [facility] Pager [phone]. If 7PM-7AM, please contact night-coverage at [email or web address], password [facility] 04/12/2015, 4:15 PM  LOS: 2 days

## 2015-04-12 NOTE — Clinical Social Work Note (Signed)
Clinical Social Work Assessment  Patient Details  Name: [name] MRN: [id number] Date of Birth: 26-Aug-1941  Date of referral:  04/12/15               Reason for consult:  Facility Placement                Permission sought to share information with:  Family Supports (Unable to seek permission from patient as she is only oriented to self) Permission granted to share information::  No  Name::     Osia Aye, daughter [phone])  Agency::     Relationship::     Contact Information:     Housing/Transportation Living arrangements for the past 2 months:  Apartment Source of Information:  Adult Children Phineas Real) Patient Interpreter Needed:  None Criminal Activity/Legal Involvement Pertinent to Current Situation/Hospitalization:  No - Comment as needed Significant Relationships:  Adult Children (Daughter [name] and patient has a brother in [place]) Lives with:  Relatives Do you feel safe going back to the place where you live?  Yes Need for family participation in patient care:  Yes (Comment)  Care giving concerns:  Daughter unable to care for mother at home at this time and is very concerned about patient receiving appropriate care at a skilled facility.  Social Worker assessment / plan:  CSW talked with patient's daughter by phone regarding discharge planning skilled facility placement. Daughter in agreement, however was very adamant about her facility choice. CSW and Ms. Delis communicated frequently on 7/12 and CSW utilized active and emphatic listening skills as daughter became very frustrated when her facility of choice declined patient. Daughter is receiving help with a facility choice from her uncle. As of 5:40 pm on 7/12 Ms. Murnan is considering [facility] and will follow-up with CSW on Wednesday with her final decision.   Employment status:  Retired Forensic scientist:  Information systems manager, [employer] PT Recommendations:  [place] /  Referral to community resources:  Other (Comment Required) (Not needed or requested at this time)  Patient/Family's Response to care:  Daughter has not expressed concerns regarding current care at hospital. She is very adamant and firm regarding her mother not being neglected and receiving appropriate care at a skilled facility.  Patient/Family's Understanding of and Emotional Response to Diagnosis, Current Treatment, and Prognosis:  Not discussed.  Emotional Assessment Appearance:  Appears stated age (CSW looked in on patient but did not talk with her as she is only oriented to self.) Attitude/Demeanor/Rapport:  Unable to Assess Affect (typically observed):  Unable to Assess Orientation:  Oriented to Self Alcohol / Substance use:  Never Used Psych involvement (Current and /or in the community):  No (Comment)  Discharge Needs  Concerns to be addressed:  No discharge needs identified, Discharge Planning Concerns Readmission within the last 30 days:  No Current discharge risk:  None Barriers to Discharge:  No Barriers Identified   Sable Feil, LCSW 04/12/2015, 5:31 PM

## 2015-04-12 NOTE — Care Management Important Message (Signed)
Important Message  Patient Details  Name: [name] MRN: [id number] Date of Birth: [birth year]   Medicare Important Message Given:  Yes-second notification given    Delorse Lek 04/12/2015, 9:41 AM

## 2015-04-12 NOTE — Evaluation (Signed)
Physical Therapy Evaluation Patient Details Name: [name] MRN: [id number] DOB: Mar 06, 1941 Today's Date: 04/12/2015   History of Present Illness  Patient is a 74 y/o female with hx of ESRD, multiple myeloma IIIB, HTN and thyroid disease who presents from home s/p fall and found to have AMS.   Clinical Impression  Patient presents with generalized weakness and balance deficits impacting safe mobility. Pt Mod A for transfers and ambulation. Pt lives alone and not safe to return there at d/c due to being high fall risk. Might benefit from use of RW. Would benefit from ST SNF to maximize independence, minimize fall risk and improve mobility prior to return home.    Follow Up Recommendations SNF;Supervision/Assistance - 24 hour    Equipment Recommendations  None recommended by PT    Recommendations for Other Services       Precautions / Restrictions Precautions Precautions: Fall Restrictions Weight Bearing Restrictions: No      Mobility  Bed Mobility               General bed mobility comments: Sitting EOB upon PT arrival.   Transfers Overall transfer level: Needs assistance Equipment used: 1 person hand held assist Transfers: Sit to/from Stand Sit to Stand: Mod assist         General transfer comment: Mod A to boost to standing from EOB. Increased knee flexion. Unsteady. Posterior lean.   Ambulation/Gait Ambulation/Gait assistance: Mod assist Ambulation Distance (Feet): 25 Feet Assistive device: 1 person hand held assist Gait Pattern/deviations: Step-to pattern;Decreased stride length;Decreased step length - right;Decreased step length - left;Trunk flexed   Gait velocity interpretation: <1.8 ft/sec, indicative of risk for recurrent falls General Gait Details: Pt with slow, unsteady gait with increased knee flexion and forward momentum. Mod A for balance/stability. Would benefit from use of RW.  Stairs            Wheelchair Mobility    Modified Rankin  (Stroke Patients Only)       Balance Overall balance assessment: Needs assistance Sitting-balance support: Feet supported;No upper extremity supported Sitting balance-Leahy Scale: Good     Standing balance support: During functional activity Standing balance-Leahy Scale: Poor Standing balance comment: Requires Min-Mod A for static/dynamic standing balance. Posterior lean initially.                             Pertinent Vitals/Pain Pain Assessment: No/denies pain    Home Living Family/patient expects to be discharged to:: Skilled nursing facility Living Arrangements: Alone                    Prior Function           Comments: Pt lives alone. Reports no use of AD.     Hand Dominance   Dominant Hand: Right    Extremity/Trunk Assessment   Upper Extremity Assessment: Defer to OT evaluation           Lower Extremity Assessment: Generalized weakness (Coldness reported in Bil feet with tingling.)         Communication   Communication: No difficulties  Cognition Arousal/Alertness: Awake/alert Behavior During Therapy: WFL for tasks assessed/performed Overall Cognitive Status: No family/caregiver present to determine baseline cognitive functioning (A&O x3.)                      General Comments      Exercises        Assessment/Plan  PT Assessment Patient needs continued PT services  PT Diagnosis Difficulty walking;Generalized weakness   PT Problem List Decreased strength;Impaired sensation;Decreased activity tolerance;Decreased balance;Decreased mobility;Decreased safety awareness  PT Treatment Interventions Balance training;Gait training;Functional mobility training;Therapeutic activities;Therapeutic exercise;Patient/family education;DME instruction   PT Goals (Current goals can be found in the Care Plan section) Acute Rehab PT Goals Patient Stated Goal: to get my full body stronger PT Goal Formulation: With patient Time  For Goal Achievement: 04/26/15 Potential to Achieve Goals: Good    Frequency Min 3X/week   Barriers to discharge Decreased caregiver support Pt lives alone.    Co-evaluation               End of Session Equipment Utilized During Treatment: Gait belt Activity Tolerance: Patient tolerated treatment well Patient left: in bed;with call bell/phone within reach;with bed alarm set (sitting EOB eating lunch.) Nurse Communication: Mobility status;Precautions         Time: 9733-1250 PT Time Calculation (min) (ACUTE ONLY): 16 min   Charges:   PT Evaluation $Initial PT Evaluation Tier I: 1 Procedure     PT G Codes:        Gabriellia Rempel A Manisha Cancel 04/12/2015, 3:10 PM Wray Kearns, [place], DPT [phone]

## 2015-04-12 NOTE — Progress Notes (Signed)
[facility] Progress Note  Assessment/Plan: 1. AMS - ? Component of uremia vs dementia vs other - signed off early from her last two HD tmts and skips at times; at baseline has very definite ideas about what she will or will not do. Tuesday she is calmer than yesterday - at baseline. Slept well after ambien. 2. ESRD - mild hyperkalemia and hyponatremia had HD Sunday back on schedule today- TTS  3. Anemia - Hgb 14.4 - holding; had gotten Aranesp 200 when Hgb was 11.8 4. Secondary hyperparathyroidism - on Hectorol 10 with mild hypercalcemia - plan reduce Hectorol for now to 6- use 2 K 2 Ca bath Monday and Tuesday; last outpt P was 7.7 , 8.7 here- supposed to be on 2 fosrenol and one phoslo ac - resume fosrenol 1 gm ac for now 5. HTN/volume - outpt med list says hydralazine dose is 50 bid, 100 tid here; she cannot tell me what it is- BP generally ^^ at outpt HD- net UF 2 L on Sunday - with post weight of 56.7. Need to titrate volume as tolerated for BP control. 6. Nutrition -renal diet 7. [place], PA-C [place] [phone] 04/12/2015,9:18 AM  LOS: 2 days   Pt seen, examined and agree w A/P as above.  She is fully oriented today, suspect uremia was contributing as she is getting better with HD.   Kelly Splinter MD pager [phone]    cell [phone] 04/12/2015, 12:46 PM    Subjective:   Wants to know when she is going to HD; declined this am because she wanted to eat  Objective Filed Vitals:   04/11/15 1544 04/11/15 1746 04/11/15 2100 04/12/15 0500  BP: 143/76 166/74 158/86 129/96  Pulse: 57 75 75 74  Temp:  98 F (36.7 C) 99.3 F (37.4 C) 99.5 F (37.5 C)  TempSrc:  Oral Axillary Axillary  Resp:  '18 18 18  ' Height:      Weight:   56.4 kg (124 lb 5.4 oz)   SpO2:  97% 99% 99%   Physical Exam General: NAD, sitting in bed, calm Heart: RRR Lungs: no rales Abdomen: soft NT Extremities: no sig edema Dialysis Access: left upper AVF  + bruit  Dialysis Orders: TTS  3h 32mn LUA AVF 58.5kg 2/2.25 bath Heparin none Hectorol 10 ug tiw  Additional Objective Labs: Basic Metabolic Panel:  Recent Labs Lab 04/10/15 1629 04/10/15 2114  NA 129* 129*  K 5.9* 6.0*  CL 92* 91*  CO2 25 25  GLUCOSE 77 69  BUN 63* 64*  CREATININE 11.23* 11.54*  CALCIUM 10.5* 10.4*  PHOS  --  8.7*   Liver Function Tests:  Recent Labs Lab 04/10/15 1629 04/10/15 2114  AST 36  --   ALT 13*  --   ALKPHOS 42  --   BILITOT 1.2  --   PROT 10.3*  --   ALBUMIN 3.4* 3.5     Recent Labs Lab 04/10/15 1629  WBC 5.7  NEUTROABS 4.1  HGB 14.4  HCT 44.4  MCV 96.9  PLT 201   Blood Culture    Component Value Date/Time   SDES URINE, CLEAN CATCH 04/10/2015 1800   SPECREQUEST NONE 04/10/2015 1800   CULT  04/10/2015 1800    MULTIPLE SPECIES PRESENT, SUGGEST RECOLLECTION IF CLINICALLY INDICATED   REPTSTATUS 04/11/2015 FINAL 04/10/2015 1800    Cardiac Enzymes:  Recent Labs Lab 04/10/15 1629  CKTOTAL 268*  Studies/Results: Ct Head Wo Contrast  04/10/2015   CLINICAL DATA:  [place] out of bed and hit head today.  EXAM: CT HEAD WITHOUT CONTRAST  CT CERVICAL SPINE WITHOUT CONTRAST  TECHNIQUE: Multidetector CT imaging of the head and cervical spine was performed following the standard protocol without intravenous contrast. Multiplanar CT image reconstructions of the cervical spine were also generated.  COMPARISON:  None.  FINDINGS: CT HEAD FINDINGS  The calvarium is thickened in there are scattered lucencies in the die PleurX space. Patient has multiple myeloma. No acute fracture. There are age related changes of cerebral atrophy, ventriculomegaly and periventricular white matter disease. No acute intracranial findings such as hemispheric infarction an or intracranial hemorrhage. No extra-axial fluid collections are identified. The brainstem and cerebellum are grossly normal.  The paranasal sinuses and mastoid air cells are clear. The globes  are intact.  CT CERVICAL SPINE FINDINGS  Degenerative cervical spondylosis with multilevel disc disease and facet disease but no acute cervical spine fracture. Scattered lucencies consistent with myelomatous lesions. The skullbase C1 and C1-2 articulations are maintained. The dens is intact. Multilevel foraminal stenosis due to uncinate spurring and facet disease. Carotid artery calcifications are noted. The visualized lung apices are grossly clear.  IMPRESSION: No acute intracranial findings or skull fracture.  Degenerative cervical spondylosis with multilevel disc disease and facet disease but no acute cervical spine fracture.  Multiple myelomatous bone lesions but no pathologic fracture.   Electronically Signed   By: Marijo Sanes M.D.   On: 04/10/2015 16:21   Ct Cervical Spine Wo Contrast  04/10/2015   CLINICAL DATA:  [place] out of bed and hit head today.  EXAM: CT HEAD WITHOUT CONTRAST  CT CERVICAL SPINE WITHOUT CONTRAST  TECHNIQUE: Multidetector CT imaging of the head and cervical spine was performed following the standard protocol without intravenous contrast. Multiplanar CT image reconstructions of the cervical spine were also generated.  COMPARISON:  None.  FINDINGS: CT HEAD FINDINGS  The calvarium is thickened in there are scattered lucencies in the die PleurX space. Patient has multiple myeloma. No acute fracture. There are age related changes of cerebral atrophy, ventriculomegaly and periventricular white matter disease. No acute intracranial findings such as hemispheric infarction an or intracranial hemorrhage. No extra-axial fluid collections are identified. The brainstem and cerebellum are grossly normal.  The paranasal sinuses and mastoid air cells are clear. The globes are intact.  CT CERVICAL SPINE FINDINGS  Degenerative cervical spondylosis with multilevel disc disease and facet disease but no acute cervical spine fracture. Scattered lucencies consistent with myelomatous lesions. The skullbase C1  and C1-2 articulations are maintained. The dens is intact. Multilevel foraminal stenosis due to uncinate spurring and facet disease. Carotid artery calcifications are noted. The visualized lung apices are grossly clear.  IMPRESSION: No acute intracranial findings or skull fracture.  Degenerative cervical spondylosis with multilevel disc disease and facet disease but no acute cervical spine fracture.  Multiple myelomatous bone lesions but no pathologic fracture.   Electronically Signed   By: Marijo Sanes M.D.   On: 04/10/2015 16:21   Dg Abd Portable 1v  04/10/2015   CLINICAL DATA:  Acute abdominal pain and nausea.  EXAM: PORTABLE ABDOMEN - 1 VIEW  COMPARISON:  01/21/2015 CT  FINDINGS: Gas in nondistended colon and small bowel noted. No dilated bowel loops are present. No suspicious calcifications are identified. No acute bony abnormalities are noted.  IMPRESSION: Unremarkable bowel gas pattern.   Electronically Signed   By: Margarette Canada M.D.   On: 04/10/2015 16:02   Dg  Hip Unilat With Pelvis 2-3 Views Left  04/10/2015   CLINICAL DATA:  Fall in bedroom this morning. Left hip injury and pain. Multiple myeloma. Initial encounter.  EXAM: DG HIP (WITH OR WITHOUT PELVIS) 2-3V LEFT  COMPARISON:  None.  FINDINGS: There is no evidence of hip fracture or dislocation. There is no evidence of hip joint arthropathy or focal lytic or sclerotic bone lesions.  IMPRESSION: Negative.   Electronically Signed   By: Earle Gell M.D.   On: 04/10/2015 14:21   Medications:   . amLODipine  5 mg Oral Daily  . cloNIDine  0.1 mg Oral Once  . doxercalciferol  6 mcg Intravenous Q T,Th,Sa-HD  . feeding supplement (ENSURE ENLIVE)  237 mL Oral Q1500  . feeding supplement (NEPRO CARB STEADY)  237 mL Oral Daily  . heparin  5,000 Units Subcutaneous 3 times per day  . hydrALAZINE  100 mg Oral TID  . isosorbide mononitrate  60 mg Oral Daily  . lanthanum  1,000 mg Oral TID WC  . multivitamin  1 tablet Oral QHS  . pantoprazole  40 mg  Oral Daily  . sodium chloride  3 mL Intravenous Q12H

## 2015-04-13 MED ORDER — CLONIDINE HCL 0.1 MG PO TABS: 0.1000 mg | ORAL_TABLET | Freq: Two times a day (BID) | ORAL | Status: DC

## 2015-04-13 MED ORDER — RENA-VITE PO TABS: 1.0000 | ORAL_TABLET | Freq: Every day | ORAL | Status: DC

## 2015-04-13 NOTE — Progress Notes (Signed)
[facility] Progress Note  Assessment/Plan: 1. AMS - ? Component of uremia vs dementia vs other - signs off early from her last two HD tmts and skips at times; at baseline has very definite ideas about what she will or will not do.  Oriented today/Wed at baseline, though intermittent periods of restlessness/disorientation per notes. 2. ESRD  TTS- mild hyperkalemia -resolved and hyponatremia -next HD Thursday 3. Anemia - Hgb 14.4 down to 10.8?? - holding ESA; had gotten Aranesp 200 when Hgb was 11.8- repeat CBC Thursday and decide on ESA 4. Secondary hyperparathyroidism - on Hectorol 10 with mild hypercalcemia - reduced Hectorol to 6 but will hold for Thursday - use 2 K 2 Ca bath Monday and Tuesday; last outpt P was 7.7 , 8.7 down to 7.6 - supposed to be on 2 fosrenol and one phoslo ac - resume fosrenol 1 gm ac for now; continue on 2 Ca bath - ^ Ca may be somewhat related to MM too 5. HTN/volume - outpt med list says hydralazine dose is 50 bid, 100 tid here; she cannot tell me what it is- BP generally ^^ at outpt HD- net UF 2 L on Sunday - 1.5 on Tuesday with post weight of  55.7 - continue to titrate volume as tolerated for BP control- will have a lower EDW at d/co; she has been very resistant at the outpt center in regards to having her EDW lowered. 6. Nutrition -renal diet 7. MM  8. Disp - placement; she really is not safe to live alone  Myriam Jacobson, PA-C [place] [phone] 04/13/2015,10:16 AM  LOS: 3 days   Pt seen, examined and agree w A/P as above.  Kelly Splinter MD pager [phone]    cell [phone] 04/13/2015, 12:25 PM     Subjective:   Ate all breakfast, no problems with HD yesterday. No pain  Objective Filed Vitals:   04/12/15 1406 04/12/15 1724 04/12/15 2211 04/13/15 0420  BP: 163/111 155/78 156/72 170/88  Pulse: 65 76 68 84  Temp: 98.1 F (36.7 C) 97.8 F (36.6 C) 98.7 F (37.1 C) 98.9 F (37.2 C)  TempSrc: Oral Oral Oral  Oral  Resp: 16 18 18 18   Height:      Weight: 55.7 kg (122 lb 12.7 oz)   56.564 kg (124 lb 11.2 oz)  SpO2: 96% 97% 99% 100%   Physical Exam General: calm Heart:RRR Lungs: no rales Abdomen: soft NT Extremities: no sig edema Neuro: Christmas, 2016, [facility] Access: left upper AVF + bruit  Dialysis Orders: TTS 3h 61min LUA AVF 58.5kg 2/2.25 bath Heparin none Hectorol 10 ug tiw  Additional Objective Labs: Basic Metabolic Panel:  Recent Labs Lab 04/10/15 1629 04/10/15 2114 04/12/15 1104  NA 129* 129* 130*  K 5.9* 6.0* 4.1  CL 92* 91* 93*  CO2 25 25 27   GLUCOSE 77 69 105*  BUN 63* 64* 44*  CREATININE 11.23* 11.54* 9.05*  CALCIUM 10.5* 10.4* 10.4*  PHOS  --  8.7* 7.6*   Liver Function Tests:  Recent Labs Lab 04/10/15 1629 04/10/15 2114 04/12/15 1104  AST 36  --   --   ALT 13*  --   --   ALKPHOS 42  --   --   BILITOT 1.2  --   --   PROT 10.3*  --   --   ALBUMIN 3.4* 3.5 2.7*   CBC:  Recent Labs Lab 04/10/15 1629 04/12/15 1105  WBC 5.7 4.5  NEUTROABS 4.1  --   HGB 14.4 10.8*  HCT 44.4 34.5*  MCV 96.9 96.4  PLT 201 178  Cardiac Enzymes:  Recent Labs Lab 04/10/15 1629  CKTOTAL 268*   CMedications:   . amLODipine  5 mg Oral Daily  . cloNIDine  0.1 mg Oral Once  . doxercalciferol  6 mcg Intravenous Q T,Th,Sa-HD  . feeding supplement (ENSURE ENLIVE)  237 mL Oral Q1500  . feeding supplement (NEPRO CARB STEADY)  237 mL Oral Daily  . heparin  5,000 Units Subcutaneous 3 times per day  . hydrALAZINE  100 mg Oral TID  . isosorbide mononitrate  60 mg Oral Daily  . lanthanum  1,000 mg Oral TID WC  . multivitamin  1 tablet Oral QHS  . pantoprazole  40 mg Oral Daily  . sodium chloride  3 mL Intravenous Q12H

## 2015-04-13 NOTE — Progress Notes (Signed)
04/13/2015 1:41 PM  Report called to [facility]. Patient has eaten lunch, changed and ready for transport. Social Worker will be Engineer, maintenance for patient transportation. Will continue to assess and monitor the patient until transportation arrives. Telemetry has been removed per MD orders.   [employer], RN-BC, [employer] [facility] Phone [phone]

## 2015-04-13 NOTE — Discharge Summary (Signed)
Discharge Summary  [name] BOF:[id number] DOB: 02-03-1941  PCP: Minerva Ends, MD  Admit date: 04/10/2015 Discharge date: 04/13/2015  Time spent: 25 minutes  Recommendations for Outpatient Follow-up:  1. Patient being discharged to [facility] skilled nursing facility  Discharge Diagnoses:  Active Hospital Problems   Diagnosis Date Noted  . Altered mental state 04/10/2015  . Fall 04/10/2015  . Protein-calorie malnutrition, severe 10/18/2014  . ESRD (end stage renal disease) 03/11/2014  . Multiple myeloma 03/11/2014  . Hypertension associatd with end stage renal disease on dialysis 03/11/2014    Resolved Hospital Problems   Diagnosis Date Noted Date Resolved  No resolved problems to display.    Discharge Condition: Improved, going to skilled nursing  Diet recommendation: Heart healthy  Filed Weights   04/12/15 1000 04/12/15 1406 04/13/15 0420  Weight: 57.2 kg (126 lb 1.7 oz) 55.7 kg (122 lb 12.7 oz) 56.564 kg (124 lb 11.2 oz)    History of present illness:  74 year old female with past medical history of protein calorie malnutrition, multiple myeloma and end-stage renal disease brought in by her daughter for fall and confusion. Apparently patient lives alone and has been having episodes of missing hemodialysis intermittently as well as getting more forgetful. Lab work on admission was unrevealing other than labs consistent with end stage renal disease.  Hospital Course:  Active Problems:   ESRD (end stage renal disease): Patient underwent dialysis following admission. This did improve some of her confusion, however she still did have some underlying mentation issues. I spoke with her daughter who has noticed a decline change in the patient and the daughter agreed that the patient cannot care for herself at this time.(The patient lives by herself at home).  We spoke extensively of different issues such as hospice versus 24-hour supervision versus skilled nursing and the  daughter stated that she was not able to watch over her mom or arrange for that so she opted for skilled nursing. Patient was felt to be medically able and bed was available at skilled nursing on 7/13.    Multiple myeloma: Stable for now. Patient's daughter will transport her to her chemotherapy sessions.    Hypertension associatd with end stage renal disease on dialysis   Protein-calorie malnutrition, severe: Patient meets criteria in the context of chronic illness. She was seen by nutrition and started on an sure and Nepro shakes.    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 Patient has had frequent falls at home which is suspect a combination of weakness and worsening uremia when she would skip dialysis.   Procedures:  Underwent dialysis on 7/11 and 7/13  Consultations:  Nephrology  Discharge Exam: BP 170/88 mmHg  Pulse 84  Temp(Src) 98.9 F (37.2 C) (Oral)  Resp 18  Ht '5\' 6"'  (1.676 m)  Wt 56.564 kg (124 lb 11.2 oz)  BMI 20.14 kg/m2  SpO2 100%  General: Oriented 2 Cardiovascular: Regular rate and rhythm, S1-S2 Respiratory: Clear to auscultation bilaterally  Discharge Instructions You were cared for by a hospitalist during your hospital stay. If you have any questions about your discharge medications or the care you received while you were in the hospital after you are discharged, you can call the unit and asked to speak with the hospitalist on call if the hospitalist that took care of you is not available. Once you are discharged, your primary care physician will handle any further medical issues. Please note that NO REFILLS for any discharge  medications will be authorized once you are discharged, as it is imperative that you return to your primary care physician (or establish a relationship with a primary care physician if you do not have one) for your aftercare needs so that they can reassess your need for  medications and monitor your lab values.  Discharge Instructions    Diet - low sodium heart healthy    Complete by:  As directed      Increase activity slowly    Complete by:  As directed             Medication List    TAKE these medications        amLODipine 10 MG tablet  Commonly known as:  NORVASC  Take 0.5 tablets (5 mg total) by mouth daily.     dexamethasone 4 MG tablet  Commonly known as:  DECADRON  Take 3 tablets (12 mg) on days 1,8,15, and 22 of chemotherapy.     feeding supplement (NEPRO CARB STEADY) Liqd  Take 237 mLs by mouth 2 (two) times daily between meals.     furosemide 20 MG tablet  Commonly known as:  LASIX  Take 1 tablet (20 mg total) by mouth daily. PRN for leg edema     hydrALAZINE 100 MG tablet  Commonly known as:  APRESOLINE  Take 100 mg by mouth 3 (three) times daily.     isosorbide mononitrate 60 MG 24 hr tablet  Commonly known as:  IMDUR  Take 60 mg by mouth daily.     multivitamin Tabs tablet  Take 1 tablet by mouth at bedtime.     ondansetron 4 MG disintegrating tablet  Commonly known as:  ZOFRAN ODT  Take 1 tablet (4 mg total) by mouth every 8 (eight) hours as needed for nausea or vomiting.     pantoprazole 40 MG tablet  Commonly known as:  PROTONIX  Take 1 tablet (40 mg total) by mouth daily.     prochlorperazine 10 MG tablet  Commonly known as:  COMPAZINE  Take 1 tablet (10 mg total) by mouth every 6 (six) hours as needed (Nausea or vomiting).     traZODone 50 MG tablet  Commonly known as:  DESYREL  Take 0.5-1 tablets (25-50 mg total) by mouth at bedtime as needed for sleep.     zolpidem 5 MG tablet  Commonly known as:  AMBIEN  Take 1 tablet (5 mg total) by mouth at bedtime as needed for sleep.       No Known Allergies    The results of significant diagnostics from this hospitalization (including imaging, microbiology, ancillary and laboratory) are listed below for reference.    Significant Diagnostic Studies: Ct  Head Wo Contrast  04/10/2015   CLINICAL DATA:  [place] out of bed and hit head today.  EXAM: CT HEAD WITHOUT CONTRAST  CT CERVICAL SPINE WITHOUT CONTRAST  TECHNIQUE: Multidetector CT imaging of the head and cervical spine was performed following the standard protocol without intravenous contrast. Multiplanar CT image reconstructions of the cervical spine were also generated.  COMPARISON:  None.  FINDINGS: CT HEAD FINDINGS  The calvarium is thickened in there are scattered lucencies in the die PleurX space. Patient has multiple myeloma. No acute fracture. There are age related changes of cerebral atrophy, ventriculomegaly and periventricular white matter disease. No acute intracranial findings such as hemispheric infarction an or intracranial hemorrhage. No extra-axial fluid collections are identified. The brainstem and cerebellum are grossly normal.  The paranasal sinuses  and mastoid air cells are clear. The globes are intact.  CT CERVICAL SPINE FINDINGS  Degenerative cervical spondylosis with multilevel disc disease and facet disease but no acute cervical spine fracture. Scattered lucencies consistent with myelomatous lesions. The skullbase C1 and C1-2 articulations are maintained. The dens is intact. Multilevel foraminal stenosis due to uncinate spurring and facet disease. Carotid artery calcifications are noted. The visualized lung apices are grossly clear.  IMPRESSION: No acute intracranial findings or skull fracture.  Degenerative cervical spondylosis with multilevel disc disease and facet disease but no acute cervical spine fracture.  Multiple myelomatous bone lesions but no pathologic fracture.   Electronically Signed   By: Marijo Sanes M.D.   On: 04/10/2015 16:21   Ct Cervical Spine Wo Contrast  04/10/2015   CLINICAL DATA:  [place] out of bed and hit head today.  EXAM: CT HEAD WITHOUT CONTRAST  CT CERVICAL SPINE WITHOUT CONTRAST  TECHNIQUE: Multidetector CT imaging of the head and cervical spine was performed  following the standard protocol without intravenous contrast. Multiplanar CT image reconstructions of the cervical spine were also generated.  COMPARISON:  None.  FINDINGS: CT HEAD FINDINGS  The calvarium is thickened in there are scattered lucencies in the die PleurX space. Patient has multiple myeloma. No acute fracture. There are age related changes of cerebral atrophy, ventriculomegaly and periventricular white matter disease. No acute intracranial findings such as hemispheric infarction an or intracranial hemorrhage. No extra-axial fluid collections are identified. The brainstem and cerebellum are grossly normal.  The paranasal sinuses and mastoid air cells are clear. The globes are intact.  CT CERVICAL SPINE FINDINGS  Degenerative cervical spondylosis with multilevel disc disease and facet disease but no acute cervical spine fracture. Scattered lucencies consistent with myelomatous lesions. The skullbase C1 and C1-2 articulations are maintained. The dens is intact. Multilevel foraminal stenosis due to uncinate spurring and facet disease. Carotid artery calcifications are noted. The visualized lung apices are grossly clear.  IMPRESSION: No acute intracranial findings or skull fracture.  Degenerative cervical spondylosis with multilevel disc disease and facet disease but no acute cervical spine fracture.  Multiple myelomatous bone lesions but no pathologic fracture.   Electronically Signed   By: Marijo Sanes M.D.   On: 04/10/2015 16:21   Dg Abd Portable 1v  04/10/2015   CLINICAL DATA:  Acute abdominal pain and nausea.  EXAM: PORTABLE ABDOMEN - 1 VIEW  COMPARISON:  01/21/2015 CT  FINDINGS: Gas in nondistended colon and small bowel noted. No dilated bowel loops are present. No suspicious calcifications are identified. No acute bony abnormalities are noted.  IMPRESSION: Unremarkable bowel gas pattern.   Electronically Signed   By: Margarette Canada M.D.   On: 04/10/2015 16:02   Dg Hip Unilat With Pelvis 2-3 Views  Left  04/10/2015   CLINICAL DATA:  Fall in bedroom this morning. Left hip injury and pain. Multiple myeloma. Initial encounter.  EXAM: DG HIP (WITH OR WITHOUT PELVIS) 2-3V LEFT  COMPARISON:  None.  FINDINGS: There is no evidence of hip fracture or dislocation. There is no evidence of hip joint arthropathy or focal lytic or sclerotic bone lesions.  IMPRESSION: Negative.   Electronically Signed   By: Earle Gell M.D.   On: 04/10/2015 14:21    Microbiology: Recent Results (from the past 240 hour(s))  Urine culture     Status: None   Collection Time: 04/10/15  6:00 PM  Result Value Ref Range Status   Specimen Description URINE, CLEAN CATCH  Final  Special Requests NONE  Final   Culture   Final    MULTIPLE SPECIES PRESENT, SUGGEST RECOLLECTION IF CLINICALLY INDICATED   Report Status 04/11/2015 FINAL  Final  MRSA PCR Screening     Status: None   Collection Time: 04/11/15  2:11 AM  Result Value Ref Range Status   MRSA by PCR NEGATIVE NEGATIVE Final    Comment:        The GeneXpert MRSA Assay (FDA approved for NASAL specimens only), is one component of a comprehensive MRSA colonization surveillance program. It is not intended to diagnose MRSA infection nor to guide or monitor treatment for MRSA infections.      Labs: Basic Metabolic Panel:  Recent Labs Lab 04/10/15 1629 04/10/15 2114 04/12/15 1104  NA 129* 129* 130*  K 5.9* 6.0* 4.1  CL 92* 91* 93*  CO2 '25 25 27  ' GLUCOSE 77 69 105*  BUN 63* 64* 44*  CREATININE 11.23* 11.54* 9.05*  CALCIUM 10.5* 10.4* 10.4*  PHOS  --  8.7* 7.6*   Liver Function Tests:  Recent Labs Lab 04/10/15 1629 04/10/15 2114 04/12/15 1104  AST 36  --   --   ALT 13*  --   --   ALKPHOS 42  --   --   BILITOT 1.2  --   --   PROT 10.3*  --   --   ALBUMIN 3.4* 3.5 2.7*   No results for input(s): LIPASE, AMYLASE in the last 168 hours. No results for input(s): AMMONIA in the last 168 hours. CBC:  Recent Labs Lab 04/10/15 1629 04/12/15 1105    WBC 5.7 4.5  NEUTROABS 4.1  --   HGB 14.4 10.8*  HCT 44.4 34.5*  MCV 96.9 96.4  PLT 201 178   Cardiac Enzymes:  Recent Labs Lab 04/10/15 1629  CKTOTAL 268*   BNP: BNP (last 3 results) No results for input(s): BNP in the last 8760 hours.  ProBNP (last 3 results) No results for input(s): PROBNP in the last 8760 hours.  CBG: No results for input(s): GLUCAP in the last 168 hours.     Signed:  Annita Brod  [facility] 04/13/2015, 12:10 PM

## 2015-04-13 NOTE — Progress Notes (Signed)
04/13/2015 1:43 PM  [name] to be D/C'd Skilled nursing facility per MD order.  Discussed prescriptions and follow up appointments with the patient. Prescriptions given to patient, medication list explained in detail. Pt verbalized understanding.    Medication List    TAKE these medications        amLODipine 10 MG tablet  Commonly known as:  NORVASC  Take 0.5 tablets (5 mg total) by mouth daily.     dexamethasone 4 MG tablet  Commonly known as:  DECADRON  Take 3 tablets (12 mg) on days 1,8,15, and 22 of chemotherapy.     feeding supplement (NEPRO CARB STEADY) Liqd  Take 237 mLs by mouth 2 (two) times daily between meals.     furosemide 20 MG tablet  Commonly known as:  LASIX  Take 1 tablet (20 mg total) by mouth daily. PRN for leg edema     hydrALAZINE 100 MG tablet  Commonly known as:  APRESOLINE  Take 100 mg by mouth 3 (three) times daily.     isosorbide mononitrate 60 MG 24 hr tablet  Commonly known as:  IMDUR  Take 60 mg by mouth daily.     multivitamin Tabs tablet  Take 1 tablet by mouth at bedtime.     ondansetron 4 MG disintegrating tablet  Commonly known as:  ZOFRAN ODT  Take 1 tablet (4 mg total) by mouth every 8 (eight) hours as needed for nausea or vomiting.     pantoprazole 40 MG tablet  Commonly known as:  PROTONIX  Take 1 tablet (40 mg total) by mouth daily.     prochlorperazine 10 MG tablet  Commonly known as:  COMPAZINE  Take 1 tablet (10 mg total) by mouth every 6 (six) hours as needed (Nausea or vomiting).     traZODone 50 MG tablet  Commonly known as:  DESYREL  Take 0.5-1 tablets (25-50 mg total) by mouth at bedtime as needed for sleep.     zolpidem 5 MG tablet  Commonly known as:  AMBIEN  Take 1 tablet (5 mg total) by mouth at bedtime as needed for sleep.        Filed Vitals:   04/13/15 1210  BP: 145/90  Pulse: 61  Temp: 98.2 F (36.8 C)  Resp: 18    Skin clean, dry and intact without evidence of skin break down, no evidence  of skin tears noted. IV catheter discontinued intact. Site without signs and symptoms of complications. Dressing and pressure applied. Pt denies pain at this time. No complaints noted.  An After Visit Summary was printed and given to the patient. Patient escorted via stretcher, and D/C to [facility] via EMS [facility].  [employer], RN-BC, [employer] [facility] Phone [phone]

## 2015-04-13 NOTE — Clinical Social Work Placement (Signed)
   CLINICAL SOCIAL WORK PLACEMENT  NOTE  Date:  04/13/2015  Patient Details  Name: [name] MRN: [id number] Date of Birth: [birth year]  Clinical Social Work is seeking post-discharge placement for this patient at the [place] level of care (*CSW will initial, date and re-position this form in  chart as items are completed):  Yes (Emailed to patient's daughter, Shamyra Kinsler)   Patient/family provided with [facility] Work Department's list of facilities offering this level of care within the geographic area requested by the patient (or if unable, by the patient's family).  Yes   Patient/family informed of their freedom to choose among providers that offer the needed level of care, that participate in Medicare, Medicaid or managed care program needed by the patient, have an available bed and are willing to accept the patient.  Yes   Patient/family informed of [facility] [place] [facility]'s ownership interest in [facility] and [facility], as well as of the fact that they are under no obligation to receive care at these facilities.  PASRR submitted to EDS on 04/12/15     PASRR number received on 04/12/15     Existing PASRR number confirmed on       FL2 transmitted to all facilities in geographic area requested by pt/family on 04/12/15     FL2 transmitted to all facilities within larger geographic area on       Patient informed that his/her managed care company has contracts with or will negotiate with certain facilities, including the following:        Yes   Patient/family informed of bed offers received.  Patient chooses bed at [place] recommends and patient chooses bed at      Patient to be transferred to [facility] on 04/13/15.  Patient to be transferred to facility by Ambulance [facility])     Patient family notified on 04/13/15 of transfer.  Name of family member notified:  Taneesha Neiger, daughter  [phone])     PHYSICIAN       Additional Comment:    _______________________________________________ Sable Feil, LCSW 04/13/2015, 12:07 PM

## 2015-04-13 NOTE — Progress Notes (Signed)
Patient very restless. Attempting to consistently get OOB. Reoriented patient to lay down and get some rest. Notified K. Schorr, NP. New orders received for Ativan 1mg  IV. Will administer.

## 2015-04-14 ENCOUNTER — Encounter: Payer: Self-pay | Admitting: Internal Medicine

## 2015-04-14 ENCOUNTER — Non-Acute Institutional Stay (SKILLED_NURSING_FACILITY): Payer: Medicare Other | Admitting: Internal Medicine

## 2015-04-14 DIAGNOSIS — K219 Gastro-esophageal reflux disease without esophagitis: Secondary | ICD-10-CM | POA: Diagnosis not present

## 2015-04-14 DIAGNOSIS — N186 End stage renal disease: Secondary | ICD-10-CM

## 2015-04-14 DIAGNOSIS — I119 Hypertensive heart disease without heart failure: Secondary | ICD-10-CM | POA: Diagnosis not present

## 2015-04-14 DIAGNOSIS — D630 Anemia in neoplastic disease: Secondary | ICD-10-CM

## 2015-04-14 DIAGNOSIS — E43 Unspecified severe protein-calorie malnutrition: Secondary | ICD-10-CM | POA: Diagnosis not present

## 2015-04-14 DIAGNOSIS — G4700 Insomnia, unspecified: Secondary | ICD-10-CM | POA: Diagnosis not present

## 2015-04-14 DIAGNOSIS — C9000 Multiple myeloma not having achieved remission: Secondary | ICD-10-CM | POA: Diagnosis not present

## 2015-04-14 NOTE — Progress Notes (Signed)
MRN: [id number] Name: Kelaiah Escalona  Sex: female Age: 74 y.o. DOB: [birth year]  [place] #: Helene Kelp Facility/Room:112 Level Of Care: SNF Provider: Inocencio Homes D Emergency Contacts: Extended Emergency Contact Information Primary Emergency Contact: [place] [facility] [place] Phone: [phone] Relation: Daughter  Code Status:   Allergies: Review of patient's allergies indicates no known allergies.  Chief Complaint  Patient presents with  . New Admit To SNF    HPI: Patient is 74 y.o. female who past medical history of protein calorie malnutrition, multiple myeloma and end-stage renal disease brought in by her daughter for fall and confusion. Apparently patient lives alone and has been having episodes of missing hemodialysis intermittently as well as getting more forgetful; Hosp from 7/10-13 while waiting for SNF placement. While at SNF will ne following HTN, treated with multiple drugs, GERD, tx with protonix and anemia to be monitored with serial CBC's.  Past Medical History  Diagnosis Date  . Renal disorder   . ESRD (end stage renal disease)   . Hypertension   . Multiple myeloma 2009  . Thyroid disease   . Hyperparathyroidism   . Thrombocytopenia   . Anemia   . Shortness of breath   . Pneumonia   . GERD (gastroesophageal reflux disease)   . Arthritis     rt knee is stiff  . Multiple myeloma     Past Surgical History  Procedure Laterality Date  . Abdominal hysterectomy      Pt. denies      Medication List       This list is accurate as of: 04/14/15 11:59 PM.  Always use your most recent med list.               amLODipine 10 MG tablet  Commonly known as:  NORVASC  Take 0.5 tablets (5 mg total) by mouth daily.     dexamethasone 4 MG tablet  Commonly known as:  DECADRON  Take 3 tablets (12 mg) on days 1,8,15, and 22 of chemotherapy.     feeding supplement (NEPRO CARB STEADY) Liqd  Take 237 mLs by mouth 2 (two) times daily between meals.      furosemide 20 MG tablet  Commonly known as:  LASIX  Take 1 tablet (20 mg total) by mouth daily. PRN for leg edema     hydrALAZINE 100 MG tablet  Commonly known as:  APRESOLINE  Take 100 mg by mouth 3 (three) times daily.     isosorbide mononitrate 60 MG 24 hr tablet  Commonly known as:  IMDUR  Take 60 mg by mouth daily.     multivitamin Tabs tablet  Take 1 tablet by mouth at bedtime.     ondansetron 4 MG disintegrating tablet  Commonly known as:  ZOFRAN ODT  Take 1 tablet (4 mg total) by mouth every 8 (eight) hours as needed for nausea or vomiting.     pantoprazole 40 MG tablet  Commonly known as:  PROTONIX  Take 1 tablet (40 mg total) by mouth daily.     prochlorperazine 10 MG tablet  Commonly known as:  COMPAZINE  Take 1 tablet (10 mg total) by mouth every 6 (six) hours as needed (Nausea or vomiting).     traZODone 50 MG tablet  Commonly known as:  DESYREL  Take 0.5-1 tablets (25-50 mg total) by mouth at bedtime as needed for sleep.     zolpidem 5 MG tablet  Commonly known as:  AMBIEN  Take 1 tablet (5 mg  total) by mouth at bedtime as needed for sleep.        No orders of the defined types were placed in this encounter.    Immunization History  Administered Date(s) Administered  . Influenza,inj,Quad PF,36+ Mos 07/23/2014    History  Substance Use Topics  . Smoking status: Never Smoker   . Smokeless tobacco: Never Used  . Alcohol Use: No    Family history is noncontributory    Review of Systems  DATA OBTAINED: from patient, nurse GENERAL:  no fevers, fatigue, appetite changes SKIN: No itching, rash EYES: No eye pain, redness, discharge EARS: No earache, tinnitus, change in hearing NOSE: No congestion, drainage or bleeding  MOUTH/THROAT: No mouth or tooth pain, No sore throat RESPIRATORY: No cough, wheezing, SOB CARDIAC: No chest pain, palpitations, lower extremity edema  GI: No abdominal pain, No N/V/D or constipation, No heartburn or reflux  GU:  No dysuria, frequency or urgency, or incontinence  MUSCULOSKELETAL: No unrelieved bone/joint pain NEUROLOGIC: No headache, dizziness PSYCHIATRIC: No overt anxiety or sadness, No behavior issue.   Filed Vitals:   04/14/15 2153  BP: 170/88  Pulse: 84  Temp: 98.9 F (37.2 C)  Resp: 18  O2 sat 100%   Physical Exam  GENERAL APPEARANCE: Alert,  No acute distress.  SKIN: No diaphoresis rash HEAD: Normocephalic, atraumatic  EYES: Conjunctiva/lids clear. Pupils round, reactive. EOMs intact.  EARS: External exam WNL, canals clear. Hearing grossly normal.  NOSE: No deformity or discharge.  MOUTH/THROAT: Lips w/o lesions  RESPIRATORY: Breathing is even, unlabored. Lung sounds are clear   CARDIOVASCULAR: Heart RRR no murmurs, rubs or gallops. No peripheral edema.   GASTROINTESTINAL: Abdomen is soft, non-tender, not distended w/ normal bowel sounds. GENITOURINARY: Bladder non tender, not distended  MUSCULOSKELETAL: No abnormal joints or musculature NEUROLOGIC:  Cranial nerves 2-12 grossly intact. Moves all extremities  PSYCHIATRIC: Mood and affect appropriate to situation, no behavioral issues  Patient Active Problem List   Diagnosis Date Noted  . Hypertensive heart disease 04/23/2015  . GERD (gastroesophageal reflux disease) 04/23/2015  . Altered mental state 04/10/2015  . Fall 04/10/2015  . Insomnia 02/23/2015  . Cholelithiases 01/24/2015  . Bilateral leg edema 12/27/2014  . Healthcare maintenance 11/03/2014  . Protein-calorie malnutrition, severe 10/18/2014  . HCAP (healthcare-associated pneumonia) 10/16/2014  . 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 Drug-induced neutropenia 06/09/2014  . BRBPR (bright red blood per rectum) 03/11/2014  . Acute blood loss anemia 03/11/2014  . ESRD (end stage renal disease) 03/11/2014  . Multiple myeloma 03/11/2014  . Pancytopenia 03/11/2014  . Hypertension associatd with end stage renal disease on dialysis 03/11/2014    CBC     Component Value Date/Time   WBC 4.5 04/12/2015 1105   WBC 4.2 03/04/2015 0855   RBC 3.58* 04/12/2015 1105   RBC 3.63* 03/04/2015 0855   HGB 10.8* 04/12/2015 1105   HGB 11.5* 03/04/2015 0855   HCT 34.5* 04/12/2015 1105   HCT 35.9 03/04/2015 0855   PLT 178 04/12/2015 1105   PLT 158 03/04/2015 0855   MCV 96.4 04/12/2015 1105   MCV 99.1 03/04/2015 0855   LYMPHSABS 1.1 04/10/2015 1629   LYMPHSABS 0.8* 03/04/2015 0855   MONOABS 0.4 04/10/2015 1629   MONOABS 0.5 03/04/2015 0855   EOSABS 0.0 04/10/2015 1629   EOSABS 0.0 03/04/2015 0855   BASOSABS 0.0 04/10/2015 1629   BASOSABS 0.0 03/04/2015 0855    CMP     Component Value Date/Time   NA 130* 04/12/2015 1104  NA 135* 02/25/2015 1002   K 4.1 04/12/2015 1104   K 4.2 02/25/2015 1002   CL 93* 04/12/2015 1104   CO2 27 04/12/2015 1104   CO2 29 02/25/2015 1002   GLUCOSE 105* 04/12/2015 1104   GLUCOSE 86 02/25/2015 1002   BUN 44* 04/12/2015 1104   BUN 22.3 02/25/2015 1002   CREATININE 9.05* 04/12/2015 1104   CREATININE 5.6* 02/25/2015 1002   CALCIUM 10.4* 04/12/2015 1104   CALCIUM 9.8 02/25/2015 1002   PROT 10.3* 04/10/2015 1629   PROT 8.6* 02/25/2015 1002   ALBUMIN 2.7* 04/12/2015 1104   ALBUMIN 3.1* 02/25/2015 1002   AST 36 04/10/2015 1629   AST 16 02/25/2015 1002   ALT 13* 04/10/2015 1629   ALT 10 02/25/2015 1002   ALKPHOS 42 04/10/2015 1629   ALKPHOS 55 02/25/2015 1002   BILITOT 1.2 04/10/2015 1629   BILITOT 0.52 02/25/2015 1002   GFRNONAA 4* 04/12/2015 1104   GFRAA 4* 04/12/2015 1104    Assessment and Plan  ESRD (end stage renal disease) Tues,Thurs,Sat dialysis; Plan - no skipping  Multiple myeloma Pt is not candidate for further tx per onc at [facility];last note she was getting a second opinion ;Plan - await second opinion  Hypertensive heart disease Reasonably controlled on meds and dialysis;SNF  plan - cont norvasc 5 mg, clonidine 0.1 mg BID, lasix 20 mg, hydralazine 100 mg TID and Imdur 60  mg  Protein-calorie malnutrition, severe Alb 2.7; SNF plan - ensure and nephrovite  GERD (gastroesophageal reflux disease) Reported stable; SNF plan - cont protonix 40 mg daily  Insomnia Reported stable ; SNF plan - trazodone reularly and ambien prn  Anemia in neoplastic disease Reported stable, last H 10.8/H 34.5 no need for erythropoetin stim or tx; SNF plan - monitor with CBCs    Hennie Duos, MD

## 2015-04-15 ENCOUNTER — Other Ambulatory Visit: Payer: Self-pay

## 2015-04-18 ENCOUNTER — Ambulatory Visit: Payer: Self-pay | Admitting: Family Medicine

## 2015-04-23 ENCOUNTER — Encounter: Payer: Self-pay | Admitting: Internal Medicine

## 2015-04-23 DIAGNOSIS — K219 Gastro-esophageal reflux disease without esophagitis: Secondary | ICD-10-CM

## 2015-04-23 HISTORY — DX: Gastro-esophageal reflux disease without esophagitis: K21.9

## 2015-04-23 NOTE — Assessment & Plan Note (Signed)
Tues,Thurs,Sat dialysis; Plan - no skipping

## 2015-04-23 NOTE — Assessment & Plan Note (Signed)
Pt is not candidate for further tx per onc at [facility];last note she was getting a second opinion ;Plan - await second opinion

## 2015-04-23 NOTE — Assessment & Plan Note (Signed)
Reported stable, last H 10.8/H 34.5 no need for erythropoetin stim or tx; SNF plan - monitor with CBCs

## 2015-04-23 NOTE — Assessment & Plan Note (Signed)
Reasonably controlled on meds and dialysis;SNF  plan - cont norvasc 5 mg, clonidine 0.1 mg BID, lasix 20 mg, hydralazine 100 mg TID and Imdur 60 mg

## 2015-04-23 NOTE — Assessment & Plan Note (Signed)
Reported stable; SNF plan - cont protonix 40 mg daily

## 2015-04-23 NOTE — Assessment & Plan Note (Signed)
Alb 2.7; SNF plan - ensure and nephrovite

## 2015-04-23 NOTE — Assessment & Plan Note (Signed)
Reported stable ; SNF plan - trazodone reularly and ambien prn

## 2015-04-29 ENCOUNTER — Non-Acute Institutional Stay (SKILLED_NURSING_FACILITY): Payer: Medicare Other | Admitting: Nurse Practitioner

## 2015-04-29 ENCOUNTER — Other Ambulatory Visit: Payer: Self-pay

## 2015-04-29 ENCOUNTER — Encounter: Payer: Self-pay | Admitting: Nurse Practitioner

## 2015-04-29 DIAGNOSIS — I119 Hypertensive heart disease without heart failure: Secondary | ICD-10-CM

## 2015-04-29 DIAGNOSIS — C9000 Multiple myeloma not having achieved remission: Secondary | ICD-10-CM

## 2015-04-29 DIAGNOSIS — G4700 Insomnia, unspecified: Secondary | ICD-10-CM

## 2015-04-29 DIAGNOSIS — K219 Gastro-esophageal reflux disease without esophagitis: Secondary | ICD-10-CM | POA: Diagnosis not present

## 2015-04-29 DIAGNOSIS — E43 Unspecified severe protein-calorie malnutrition: Secondary | ICD-10-CM | POA: Diagnosis not present

## 2015-04-29 DIAGNOSIS — N186 End stage renal disease: Secondary | ICD-10-CM

## 2015-04-29 NOTE — Progress Notes (Signed)
Nursing Home Location:  [facility]   Place of Service: SNF (31)  PCP: Minerva Ends, MD  No Known Allergies  Chief Complaint  Patient presents with  . Discharge Note    HPI:  Patient is a 74 y.o. female seen today at [facility] for discharge home. Pt with a pmh of protein calorie malnutrition, multiple myeloma and end-stage renal disease. Pt had missed HD and was getting more confused. HD helped with confusion and he was discharged to [facility] after hospitalization for ongoing rehab. Patient currently doing well with therapy, now stable to discharge home with supervision and home health.   Review of Systems:  Review of Systems  Constitutional: Negative for activity change, appetite change, fatigue and unexpected weight change.  HENT: Negative for congestion and hearing loss.   Eyes: Negative.   Respiratory: Negative for cough and shortness of breath.   Cardiovascular: Negative for chest pain, palpitations and leg swelling.  Gastrointestinal: Negative for abdominal pain, diarrhea and constipation.  Genitourinary: Negative for dysuria.  Musculoskeletal: Negative for myalgias and arthralgias.  Skin: Negative for color change and wound.  Neurological: Negative for dizziness and weakness.  Psychiatric/Behavioral: Negative for behavioral problems, confusion and agitation.    Past Medical History  Diagnosis Date  . Renal disorder   . ESRD (end stage renal disease)   . Hypertension   . Multiple myeloma 2009  . Thyroid disease   . Hyperparathyroidism   . Thrombocytopenia   . Anemia   . Shortness of breath   . Pneumonia   . GERD (gastroesophageal reflux disease)   . Arthritis     rt knee is stiff  . Multiple myeloma    Past Surgical History  Procedure Laterality Date  . Abdominal hysterectomy      Pt. denies   Social History:   reports that she has never smoked. She has never used smokeless tobacco. She reports that she does not  drink alcohol or use illicit drugs.  Family History  Problem Relation Age of Onset  . Hypertension Mother   . Hypertension Father     Medications: Patient's Medications  New Prescriptions   No medications on file  Previous Medications   AMLODIPINE (NORVASC) 10 MG TABLET    Take 0.5 tablets (5 mg total) by mouth daily.   FUROSEMIDE (LASIX) 20 MG TABLET    Take 1 tablet (20 mg total) by mouth daily. PRN for leg edema   HYDRALAZINE (APRESOLINE) 100 MG TABLET    Take 100 mg by mouth 3 (three) times daily.   ISOSORBIDE MONONITRATE (IMDUR) 60 MG 24 HR TABLET    Take 60 mg by mouth daily.   MULTIVITAMIN (RENA-VIT) TABS TABLET    Take 1 tablet by mouth at bedtime.   NUTRITIONAL SUPPLEMENTS (FEEDING SUPPLEMENT, NEPRO CARB STEADY,) LIQD    Take 237 mLs by mouth 2 (two) times daily between meals.   ONDANSETRON (ZOFRAN ODT) 4 MG DISINTEGRATING TABLET    Take 1 tablet (4 mg total) by mouth every 8 (eight) hours as needed for nausea or vomiting.   PANTOPRAZOLE (PROTONIX) 40 MG TABLET    Take 1 tablet (40 mg total) by mouth daily.   PROCHLORPERAZINE (COMPAZINE) 10 MG TABLET    Take 1 tablet (10 mg total) by mouth every 6 (six) hours as needed (Nausea or vomiting).   TRAZODONE (DESYREL) 50 MG TABLET    Take 0.5-1 tablets (25-50 mg total) by mouth at bedtime as needed  for sleep.   ZOLPIDEM (AMBIEN) 5 MG TABLET    Take 1 tablet (5 mg total) by mouth at bedtime as needed for sleep.  Modified Medications   No medications on file  Discontinued Medications   DEXAMETHASONE (DECADRON) 4 MG TABLET    Take 3 tablets (12 mg) on days 1,8,15, and 22 of chemotherapy.     Physical Exam: Filed Vitals:   04/29/15 1538  BP: 148/74  Pulse: 88  Temp: 97.4 F (36.3 C)  Resp: 20  Weight: 135 lb (61.236 kg)    Physical Exam  Constitutional: She is oriented to person, place, and time. She appears well-developed and well-nourished. No distress.  HENT:  Head: Normocephalic and atraumatic.  Mouth/Throat:  Oropharynx is clear and moist. No oropharyngeal exudate.  Eyes: Conjunctivae are normal. Pupils are equal, round, and reactive to light.  Neck: Normal range of motion. Neck supple.  Cardiovascular: Normal rate, regular rhythm and normal heart sounds.   AV fisula left upper arm +bruit   Pulmonary/Chest: Effort normal and breath sounds normal.  Abdominal: Soft. Bowel sounds are normal.  Musculoskeletal: She exhibits no edema or tenderness.  Neurological: She is alert and oriented to person, place, and time.  Skin: Skin is warm and dry. She is not diaphoretic.  Psychiatric: She has a normal mood and affect.    Labs reviewed: Basic Metabolic Panel:  Recent Labs  10/19/14 0441  04/10/15 1629 04/10/15 2114 04/12/15 1104  NA 132*  < > 129* 129* 130*  K 3.9  < > 5.9* 6.0* 4.1  CL 101  < > 92* 91* 93*  CO2 25  < > '25 25 27  ' GLUCOSE 79  < > 77 69 105*  BUN 42*  < > 63* 64* 44*  CREATININE 10.67*  < > 11.23* 11.54* 9.05*  CALCIUM 8.0*  < > 10.5* 10.4* 10.4*  PHOS 5.5*  --   --  8.7* 7.6*  < > = values in this interval not displayed. Liver Function Tests:  Recent Labs  02/04/15 0945 02/25/15 1002 04/10/15 1629 04/10/15 2114 04/12/15 1104  AST 13 16 36  --   --   ALT 7 10 13*  --   --   ALKPHOS 50 55 42  --   --   BILITOT 0.46 0.52 1.2  --   --   PROT 7.8 8.6* 10.3*  --   --   ALBUMIN 3.1* 3.1* 3.4* 3.5 2.7*    Recent Labs  01/21/15 0032  LIPASE 68*   No results for input(s): AMMONIA in the last 8760 hours. CBC:  Recent Labs  02/25/15 1002 03/04/15 0855 04/10/15 1629 04/12/15 1105  WBC 3.0* 4.2 5.7 4.5  NEUTROABS 1.2* 2.8 4.1  --   HGB 11.6 11.5* 14.4 10.8*  HCT 37.3 35.9 44.4 34.5*  MCV 99.1 99.1 96.9 96.4  PLT 190 158 201 178   TSH:  Recent Labs  10/17/14 0251  TSH 0.954   A1C: No results found for: HGBA1C Lipid Panel: No results for input(s): CHOL, HDL, LDLCALC, TRIG, CHOLHDL, LDLDIRECT in the last 8760 hours.   Assessment/Plan 1. ESRD (end  stage renal disease) -cont dialysis Tuesday, Thursday and Saturday, has transportation for this.   2. Multiple myeloma -Pt is not candidate for further treatment per oncology at [facility] however not ready for palliative/hospice, pt plans to get second opinion   3. Hypertensive heart disease without heart failure Stable; continue  norvasc 5 mg, clonidine 0.1 mg BID, lasix 20  mg, hydralazine 100 mg TID and Imdur 60 mg  4. Gastroesophageal reflux disease without esophagitis -cont Protonix daily  5. Insomnia Stable, cont trazodone and Ambien PRN   6. Protein-calorie malnutrition, severe Cont supplements, Albumin noted at 2.7  pt is stable for discharge with family-will need PT/OT/Nursing per home health. No DME needed. Rx written.  will need to follow up with PCP within 2 weeks.     Carlos American. Harle Battiest  [facility] [phone] 8 am - 5 pm) [phone] (after hours)

## 2015-05-09 ENCOUNTER — Ambulatory Visit: Payer: Self-pay | Admitting: Family Medicine

## 2015-05-10 ENCOUNTER — Other Ambulatory Visit: Payer: Self-pay | Admitting: *Deleted

## 2015-05-13 ENCOUNTER — Other Ambulatory Visit: Payer: Self-pay

## 2015-05-16 ENCOUNTER — Telehealth: Payer: Self-pay | Admitting: *Deleted

## 2015-05-16 NOTE — Telephone Encounter (Signed)
Beckie Busing, Physical Therapist with [facility] [phone], called  requesting VO for PT 2 X per week for 4 weeks for strengthening, balance training, safety precautions and safe transfer  Note routed to PCP

## 2015-05-17 NOTE — Telephone Encounter (Signed)
Called back to [place], gave VO for PT services

## 2015-05-27 ENCOUNTER — Other Ambulatory Visit: Payer: Self-pay

## 2015-05-30 ENCOUNTER — Other Ambulatory Visit: Payer: Self-pay | Admitting: Nurse Practitioner

## 2015-05-31 ENCOUNTER — Other Ambulatory Visit: Payer: Self-pay | Admitting: Family Medicine

## 2015-06-03 ENCOUNTER — Telehealth: Payer: Self-pay | Admitting: Family Medicine

## 2015-06-03 NOTE — Telephone Encounter (Signed)
Nurse wanted to inform PCP that patient did not want to be seen for her home visit today 06/03/15......Marland Kitchen

## 2015-06-20 ENCOUNTER — Other Ambulatory Visit: Payer: Self-pay | Admitting: Family Medicine

## 2015-06-20 DIAGNOSIS — I83893 Varicose veins of bilateral lower extremities with other complications: Secondary | ICD-10-CM

## 2015-08-19 ENCOUNTER — Other Ambulatory Visit: Payer: Self-pay | Admitting: Hematology and Oncology

## 2015-08-23 ENCOUNTER — Encounter: Payer: Self-pay | Admitting: Surgery

## 2015-08-29 ENCOUNTER — Ambulatory Visit (INDEPENDENT_AMBULATORY_CARE_PROVIDER_SITE_OTHER): Payer: Medicare Other | Admitting: Surgery

## 2015-08-29 ENCOUNTER — Ambulatory Visit (HOSPITAL_COMMUNITY)
Admission: RE | Admit: 2015-08-29 | Discharge: 2015-08-29 | Disposition: A | Discharge status: Home / Self Care | Payer: Medicare Other | Source: Ambulatory Visit | Attending: Surgery | Admitting: Surgery

## 2015-08-29 ENCOUNTER — Encounter: Payer: Self-pay | Admitting: Surgery

## 2015-08-29 VITALS — BP 94/61 | HR 63 | Ht 66.0 in | Wt 136.1 lb

## 2015-08-29 DIAGNOSIS — I872 Venous insufficiency (chronic) (peripheral): Secondary | ICD-10-CM

## 2015-08-29 DIAGNOSIS — N186 End stage renal disease: Secondary | ICD-10-CM | POA: Diagnosis not present

## 2015-08-29 DIAGNOSIS — I120 Hypertensive chronic kidney disease with stage 5 chronic kidney disease or end stage renal disease: Secondary | ICD-10-CM | POA: Diagnosis not present

## 2015-08-29 DIAGNOSIS — I83893 Varicose veins of bilateral lower extremities with other complications: Secondary | ICD-10-CM | POA: Insufficient documentation

## 2015-08-29 NOTE — Progress Notes (Signed)
Patient name: [name] MRN: [id number] DOB: 11/09/1940 Sex: female   Referred by: Dr. Vista Lawman  Reason for referral:  Chief Complaint  Patient presents with  . New Evaluation    eval venous insuff and bilateral vv's w/ swelling    HISTORY OF PRESENT ILLNESS:  this is a 74 year old female comes in today for evaluation of leg swelling. She states that she is on dialysis Tuesday Thursday Saturday.  She states that she has had a cluster of dilated veins in her left leg but they do not bother her.  She does not have leg pain.  She does state that she will have severe swelling in her legs.  She has never worn compression stockings.   The patient has been diagnosed with multiple myeloma.  Her renal failure is complicated by hyperparathyroidism and anemia. She is medically managed for hypertension.  Past Medical History  Diagnosis Date  . Renal disorder   . ESRD (end stage renal disease) ([place])   . Hypertension   . Multiple myeloma ([place]) 2009  . Thyroid disease   . Hyperparathyroidism ([place])   . Thrombocytopenia ([place])   . Anemia   . Shortness of breath   . Pneumonia   . GERD (gastroesophageal reflux disease)   . Arthritis     rt knee is stiff  . Multiple myeloma [facility])     Past Surgical History  Procedure Laterality Date  . Abdominal hysterectomy      Pt. denies    Social History   Social History  . Marital Status: Married    Spouse Name: N/A  . Number of Children: 3  . Years of Education: N/A   Occupational History  . Retired      Pharmacist, hospital asst.   Social History Main Topics  . Smoking status: Never Smoker   . Smokeless tobacco: Never Used  . Alcohol Use: No  . Drug Use: No  . Sexual Activity: No   Other Topics Concern  . Not on file   Social History Narrative   Patient recently moved to [place] from [place] in May 2015.   [name] was born in [place], [place].   Patient has brother in [place] area.    Family History  Problem Relation Age of  Onset  . Hypertension Mother   . Hypertension Father     Allergies as of 08/29/2015  . (No Known Allergies)    Current Outpatient Prescriptions on File Prior to Visit  Medication Sig Dispense Refill  . amLODipine (NORVASC) 10 MG tablet Take 0.5 tablets (5 mg total) by mouth daily. 30 tablet 5  . furosemide (LASIX) 20 MG tablet Take 1 tablet (20 mg total) by mouth daily. PRN for leg edema 60 tablet 2  . hydrALAZINE (APRESOLINE) 100 MG tablet Take 100 mg by mouth 3 (three) times daily.    . isosorbide mononitrate (IMDUR) 60 MG 24 hr tablet Take 60 mg by mouth daily.    . multivitamin (RENA-VIT) TABS tablet Take 1 tablet by mouth at bedtime. 30 tablet 0  . Nutritional Supplements (FEEDING SUPPLEMENT, NEPRO CARB STEADY,) LIQD Take 237 mLs by mouth 2 (two) times daily between meals. 237 mL 60  . ondansetron (ZOFRAN ODT) 4 MG disintegrating tablet Take 1 tablet (4 mg total) by mouth every 8 (eight) hours as needed for nausea or vomiting. 12 tablet 0  . pantoprazole (PROTONIX) 40 MG tablet Take 1 tablet (40 mg total) by mouth daily. 30 tablet 1  .  prochlorperazine (COMPAZINE) 10 MG tablet Take 1 tablet (10 mg total) by mouth every 6 (six) hours as needed (Nausea or vomiting). 30 tablet 1  . traZODone (DESYREL) 50 MG tablet Take 0.5-1 tablets (25-50 mg total) by mouth at bedtime as needed for sleep. 30 tablet 3  . zolpidem (AMBIEN) 5 MG tablet Take 1 tablet (5 mg total) by mouth at bedtime as needed for sleep. 30 tablet 0   No current facility-administered medications on file prior to visit.     REVIEW OF SYSTEMS: Cardiovascular: No chest pain, chest pressure, palpitations, orthopnea, or dyspnea on exertion.  Positive leg swelling Pulmonary: No productive cough, asthma or wheezing. Neurologic: No weakness, paresthesias, aphasia, or amaurosis. No dizziness. Hematologic: No bleeding problems or clotting disorders. Musculoskeletal: No joint pain or joint swelling. Gastrointestinal: No blood in  stool or hematemesis Genitourinary: No dysuria or hematuria. Psychiatric:: No history of major depression. Integumentary: No rashes or ulcers. Constitutional: No fever or chills.  PHYSICAL EXAMINATION:  Filed Vitals:   08/29/15 1100  BP: 94/61  Pulse: 63  Height: '5\' 6"'  (1.676 m)  Weight: 136 lb 1.6 oz (61.735 kg)  SpO2: 98%   Body mass index is 21.98 kg/(m^2). General: The patient appears their stated age.   HEENT:  No gross abnormalities Pulmonary: Respirations are non-labored Musculoskeletal: There are no major deformities.   Neurologic: No focal weakness or paresthesias are detected, Skin: There are no ulcer or rashes noted. Psychiatric: The patient has normal affect. Cardiovascular: There is a regular rate and rhythm without significant murmur appreciated. Palpable pedal pulses.  Large cluster of aricose veins in the left calf.  1+ pitting edema bilaterally.  Hyperpigmentation and bilateral gator areas  Diagnostic Studies:  I have reviewed her venous insufficiency ultrasound.  She has no evidence of acute DVT. There is reflux within bilateral deep venous systems.  There is also reflux in the bilateral great saphenous veins.  Assessment:   bilateral venous insufficiency Plan:  the patient's biggest complaint is that of swelling, however she states that this has been getting  Better.  She does not have any pain or cramping in her legs.  She has had a large cluster of varicose veins in her left calf but they do not bother her.  Since the patient is asymptomatic, I have recommended placing her in 20-30 millimeter compression stockings to help with her swelling.  She was given information today on how to obtain these.  She will contact me if she has any further issues.     Eldridge Abrahams, M.D. [facility] Office: [phone] Pager:  [phone]

## 2015-09-01 ENCOUNTER — Emergency Department (HOSPITAL_COMMUNITY): Payer: Medicare Other

## 2015-09-01 ENCOUNTER — Inpatient Hospital Stay (HOSPITAL_COMMUNITY)
Admission: EM | Admit: 2015-09-01 | Discharge: 2015-09-08 | DRG: 533 | Disposition: A | Discharge status: Skilled Nursing Facility | Payer: Medicare Other | Attending: Internal Medicine | Admitting: Internal Medicine

## 2015-09-01 ENCOUNTER — Encounter (HOSPITAL_COMMUNITY): Payer: Self-pay | Admitting: Emergency Medicine

## 2015-09-01 DIAGNOSIS — Z6822 Body mass index (BMI) 22.0-22.9, adult: Secondary | ICD-10-CM

## 2015-09-01 DIAGNOSIS — E875 Hyperkalemia: Secondary | ICD-10-CM | POA: Diagnosis present

## 2015-09-01 DIAGNOSIS — S72401A Unspecified fracture of lower end of right femur, initial encounter for closed fracture: Secondary | ICD-10-CM | POA: Diagnosis present

## 2015-09-01 DIAGNOSIS — D630 Anemia in neoplastic disease: Secondary | ICD-10-CM | POA: Diagnosis present

## 2015-09-01 DIAGNOSIS — Z992 Dependence on renal dialysis: Secondary | ICD-10-CM

## 2015-09-01 DIAGNOSIS — W19XXXA Unspecified fall, initial encounter: Secondary | ICD-10-CM | POA: Diagnosis present

## 2015-09-01 DIAGNOSIS — N186 End stage renal disease: Secondary | ICD-10-CM | POA: Diagnosis present

## 2015-09-01 DIAGNOSIS — E213 Hyperparathyroidism, unspecified: Secondary | ICD-10-CM | POA: Diagnosis present

## 2015-09-01 DIAGNOSIS — S72421A Displaced fracture of lateral condyle of right femur, initial encounter for closed fracture: Secondary | ICD-10-CM | POA: Diagnosis not present

## 2015-09-01 DIAGNOSIS — S72491A Other fracture of lower end of right femur, initial encounter for closed fracture: Secondary | ICD-10-CM | POA: Diagnosis not present

## 2015-09-01 DIAGNOSIS — I120 Hypertensive chronic kidney disease with stage 5 chronic kidney disease or end stage renal disease: Secondary | ICD-10-CM

## 2015-09-01 DIAGNOSIS — D701 Agranulocytosis secondary to cancer chemotherapy: Secondary | ICD-10-CM | POA: Diagnosis present

## 2015-09-01 DIAGNOSIS — Z01818 Encounter for other preprocedural examination: Secondary | ICD-10-CM

## 2015-09-01 DIAGNOSIS — T148XXA Other injury of unspecified body region, initial encounter: Secondary | ICD-10-CM

## 2015-09-01 DIAGNOSIS — N2581 Secondary hyperparathyroidism of renal origin: Secondary | ICD-10-CM | POA: Diagnosis present

## 2015-09-01 DIAGNOSIS — T451X5A Adverse effect of antineoplastic and immunosuppressive drugs, initial encounter: Secondary | ICD-10-CM | POA: Diagnosis present

## 2015-09-01 DIAGNOSIS — M2170 Unequal limb length (acquired), unspecified site: Secondary | ICD-10-CM

## 2015-09-01 DIAGNOSIS — Z01811 Encounter for preprocedural respiratory examination: Secondary | ICD-10-CM

## 2015-09-01 DIAGNOSIS — E440 Moderate protein-calorie malnutrition: Secondary | ICD-10-CM | POA: Diagnosis present

## 2015-09-01 DIAGNOSIS — K219 Gastro-esophageal reflux disease without esophagitis: Secondary | ICD-10-CM

## 2015-09-01 DIAGNOSIS — C9000 Multiple myeloma not having achieved remission: Secondary | ICD-10-CM | POA: Diagnosis present

## 2015-09-01 DIAGNOSIS — G4700 Insomnia, unspecified: Secondary | ICD-10-CM

## 2015-09-01 DIAGNOSIS — W010XXA Fall on same level from slipping, tripping and stumbling without subsequent striking against object, initial encounter: Secondary | ICD-10-CM | POA: Diagnosis present

## 2015-09-01 DIAGNOSIS — M25561 Pain in right knee: Secondary | ICD-10-CM | POA: Diagnosis not present

## 2015-09-01 DIAGNOSIS — D631 Anemia in chronic kidney disease: Secondary | ICD-10-CM | POA: Diagnosis present

## 2015-09-01 HISTORY — DX: Unspecified fracture of lower end of right femur, initial encounter for closed fracture: S72.401A

## 2015-09-01 LAB — CBC WITH DIFFERENTIAL/PLATELET
Basophils Absolute: 0 10*3/uL (ref 0.0–0.1)
Basophils Relative: 0 %
Eosinophils Absolute: 0 10*3/uL (ref 0.0–0.7)
Eosinophils Relative: 1 %
HCT: 34.8 % — ABNORMAL LOW (ref 36.0–46.0)
Hemoglobin: 10.7 g/dL — ABNORMAL LOW (ref 12.0–15.0)
Lymphocytes Relative: 10 %
Lymphs Abs: 0.4 10*3/uL — ABNORMAL LOW (ref 0.7–4.0)
MCH: 31.7 pg (ref 26.0–34.0)
MCHC: 30.7 g/dL (ref 30.0–36.0)
MCV: 103 fL — ABNORMAL HIGH (ref 78.0–100.0)
Monocytes Absolute: 0.1 10*3/uL (ref 0.1–1.0)
Monocytes Relative: 3 %
Neutro Abs: 3.3 10*3/uL (ref 1.7–7.7)
Neutrophils Relative %: 86 %
Platelets: 162 10*3/uL (ref 150–400)
RBC: 3.38 MIL/uL — ABNORMAL LOW (ref 3.87–5.11)
RDW: 16.5 % — ABNORMAL HIGH (ref 11.5–15.5)
WBC: 3.8 10*3/uL — ABNORMAL LOW (ref 4.0–10.5)

## 2015-09-01 LAB — BASIC METABOLIC PANEL
Anion gap: 6 (ref 5–15)
BUN: 25 mg/dL — ABNORMAL HIGH (ref 6–20)
CO2: 34 mmol/L — ABNORMAL HIGH (ref 22–32)
Calcium: 9.1 mg/dL (ref 8.9–10.3)
Chloride: 95 mmol/L — ABNORMAL LOW (ref 101–111)
Creatinine, Ser: 5.23 mg/dL — ABNORMAL HIGH (ref 0.44–1.00)
GFR calc Af Amer: 8 mL/min — ABNORMAL LOW (ref >60)
GFR calc non Af Amer: 7 mL/min — ABNORMAL LOW (ref >60)
Glucose, Bld: 138 mg/dL — ABNORMAL HIGH (ref 65–99)
Potassium: 4.4 mmol/L (ref 3.5–5.1)
Sodium: 135 mmol/L (ref 135–145)

## 2015-09-01 MED ORDER — FENTANYL CITRATE (PF) 100 MCG/2ML IJ SOLN
100.0000 ug | Freq: Once | INTRAMUSCULAR | Status: AC
  Administered 2015-09-01: 100 ug via INTRAVENOUS
  Filled 2015-09-01: qty 2

## 2015-09-01 MED ORDER — HYDROMORPHONE HCL 1 MG/ML IJ SOLN
1.0000 mg | Freq: Once | INTRAMUSCULAR | Status: AC
  Administered 2015-09-01: 1 mg via INTRAVENOUS
  Filled 2015-09-01: qty 1

## 2015-09-01 MED ORDER — ONDANSETRON HCL 4 MG/2ML IJ SOLN
4.0000 mg | Freq: Once | INTRAMUSCULAR | Status: AC
  Administered 2015-09-01: 4 mg via INTRAVENOUS
  Filled 2015-09-01: qty 2

## 2015-09-01 NOTE — ED Notes (Signed)
The patient said she walked from the living room to the kitchen and fell.  She is complaining of right knee pain.  EMS said there is swelling to the knee but no deformity.  EMS did Cspine and it is clear and and neuro is intact.   She is a dialysis patient and does dialysis T, TH and sat.  The patient has vomited.

## 2015-09-01 NOTE — ED Provider Notes (Signed)
CSN: [id number]     Arrival date & time 09/01/15  1922 History   First MD Initiated Contact with Patient 09/01/15 2059     Chief Complaint  Patient presents with  . Fall    The patient said she walked from the living room to the kitchen and fell.  She is complaining of right knee pain.  EMS said there is swelling to the knee but no deformity.  EMS did Cspine and it is clear and and neuro is intact.       (Consider location/radiation/quality/duration/timing/severity/associated sxs/prior Treatment) HPI Patient presents to the emergency department with right knee pain following a fall.  The patient got up off of her couch and was not using her walker when she fell ending directly on her right knee.  Fall was witnessed by her brother.  Patient did not fall and hit her head.  Patient's complaining of only right knee pain.  She does not have any hip pain, back pain, neck pain, abdominal pain, chest pain, shortness of breath, weakness, dizziness, headache, blurred vision, incontinence or syncope.  He should states that movement and palpation make the pain worse.  Patient did not take any medications prior to arrival for her symptoms Past Medical History  Diagnosis Date  . Renal disorder   . ESRD (end stage renal disease) ([place])   . Hypertension   . Multiple myeloma ([place]) 2009  . Thyroid disease   . Hyperparathyroidism ([place])   . Thrombocytopenia ([place])   . Anemia   . Shortness of breath   . Pneumonia   . GERD (gastroesophageal reflux disease)   . Arthritis     rt knee is stiff  . Multiple myeloma [facility])    Past Surgical History  Procedure Laterality Date  . Abdominal hysterectomy      Pt. denies   Family History  Problem Relation Age of Onset  . Hypertension Mother   . Hypertension Father    Social History  Substance Use Topics  . Smoking status: Never Smoker   . Smokeless tobacco: Never Used  . Alcohol Use: No   OB History    No data available     Review of Systems  All  other systems negative except as documented in the HPI. All pertinent positives and negatives as reviewed in the HPI.  Allergies  Review of patient's allergies indicates no known allergies.  Home Medications   Prior to Admission medications   Medication Sig Start Date End Date Taking? Authorizing Provider  amLODipine (NORVASC) 10 MG tablet Take 0.5 tablets (5 mg total) by mouth daily. 03/21/15  Yes Josalyn Funches, MD  cinacalcet (SENSIPAR) 30 MG tablet Take 30 mg by mouth daily.   Yes Historical Provider, MD  furosemide (LASIX) 20 MG tablet Take 1 tablet (20 mg total) by mouth daily. PRN for leg edema 03/21/15  Yes Josalyn Funches, MD  hydrALAZINE (APRESOLINE) 100 MG tablet Take 100 mg by mouth 3 (three) times daily. 03/25/15  Yes Historical Provider, MD  isosorbide mononitrate (IMDUR) 60 MG 24 hr tablet Take 60 mg by mouth daily. 03/24/15  Yes Historical Provider, MD  multivitamin (RENA-VIT) TABS tablet Take 1 tablet by mouth at bedtime. 04/13/15  Yes Annita Brod, MD  Nutritional Supplements (FEEDING SUPPLEMENT, NEPRO CARB STEADY,) LIQD Take 237 mLs by mouth 2 (two) times daily between meals. 10/19/14  Yes Karlene Einstein, MD  ondansetron (ZOFRAN ODT) 4 MG disintegrating tablet Take 1 tablet (4 mg total) by mouth every 8 (  eight) hours as needed for nausea or vomiting. 01/21/15  Yes Everlene Balls, MD  prochlorperazine (COMPAZINE) 10 MG tablet Take 1 tablet (10 mg total) by mouth every 6 (six) hours as needed (Nausea or vomiting). 03/25/14  Yes Concha Norway, MD  valACYclovir (VALTREX) 500 MG tablet Take 500 mg by mouth daily.   Yes Historical Provider, MD  traZODone (DESYREL) 50 MG tablet Take 0.5-1 tablets (25-50 mg total) by mouth at bedtime as needed for sleep. 02/23/15   Josalyn Funches, MD  zolpidem (AMBIEN) 5 MG tablet Take 1 tablet (5 mg total) by mouth at bedtime as needed for sleep. 02/11/15   Ni Gorsuch, MD   BP 156/93 mmHg  Pulse 61  Temp(Src) 98 F (36.7 C) (Oral)  Resp 18  SpO2  100% Physical Exam  Constitutional: She is oriented to person, place, and time. She appears well-developed and well-nourished. No distress.  HENT:  Head: Normocephalic and atraumatic.  Mouth/Throat: Oropharynx is clear and moist.  Eyes: Pupils are equal, round, and reactive to light.  Neck: Normal range of motion. Neck supple.  Cardiovascular: Normal rate, regular rhythm and normal heart sounds.  Exam reveals no gallop and no friction rub.   No murmur heard. Pulmonary/Chest: Effort normal and breath sounds normal. No respiratory distress. She has no wheezes.  Abdominal: Soft. Bowel sounds are normal. She exhibits no distension. There is no tenderness.  Musculoskeletal:       Right knee: She exhibits decreased range of motion, swelling, effusion, deformity and bony tenderness. Tenderness found. Medial joint line and lateral joint line tenderness noted.  Neurological: She is alert and oriented to person, place, and time. She exhibits normal muscle tone. Coordination normal.  Skin: Skin is warm and dry. No rash noted. No erythema.  Psychiatric: She has a normal mood and affect. Her behavior is normal.  Nursing note and vitals reviewed.   ED Course  Procedures (including critical care time) Labs Review Labs Reviewed  BASIC METABOLIC PANEL  CBC WITH DIFFERENTIAL/PLATELET    Imaging Review Dg Knee Complete 4 Views Right  09/01/2015  CLINICAL DATA:  Status post fall.  Right knee pain and deformity. EXAM: RIGHT KNEE - COMPLETE 4+ VIEW COMPARISON:  None. FINDINGS: There is severe osteopenia. Severely comminuted fracture of the distal femoral metaphysis. There is a vertically oriented fracture cleft involving the lateral femoral condyle. There is anterior displacement. There is no dislocation. There is a large joint effusion. There is peripheral vascular atherosclerotic disease. IMPRESSION: 1. Severely comminuted fracture of the distal femoral metaphysis with a vertically oriented fracture cleft  involving the lateral femoral condyle. Electronically Signed   By: Kathreen Devoid   On: 09/01/2015 22:49   Dg Hip Unilat With Pelvis 2-3 Views Right  09/01/2015  CLINICAL DATA:  74 year old female with fall and right knee pain. No hip complaint. EXAM: DG HIP (WITH OR WITHOUT PELVIS) 2-3V RIGHT COMPARISON:  Radiograph dated 04/10/2015 FINDINGS: There is no evidence of hip fracture or dislocation. There is degenerative changes of the visualized lower spine. Stable appearing calcific density in the right hemipelvis may represent a partially calcified fibroid or a calcified ovarian lesion. Pelvic ultrasound may provide better evaluation. IMPRESSION: No fracture or dislocation. Electronically Signed   By: Anner Crete M.D.   On: 09/01/2015 22:52   I have personally reviewed and evaluated these images and lab results as part of my medical decision-making.  I spoke with the orthopedic doctor Dr. Erlinda Hong on call, who will operate on the patient  tomorrow in the [facility] who will admit the patient  Patient was placed in a knee immobilizer as well  Dalia Heading, PA-C 09/02/15 0022  Nat Christen, MD 09/03/15 1747

## 2015-09-01 NOTE — H&P (Signed)
[facility] History and Physical  Jeania Nater SKA:[id number] DOB: November 20, 1940 DOA: 09/01/2015  Referring physician: Irena Cords, PA PCP: Benito Mccreedy, MD   Chief Complaint: Fracture femur  HPI: [name] is a 74 y.o. female with history of ESRD on HD HTN Multiple Myeloma presents after a fall. The patient was apparently getting up off her couch and fell on her knee. Patient started to have severe pain. She was not able to get up. Patients brother was in the room at the time. There was no LOC noted. She did not have any chest pain noted. She did not have any dizziness prior to falling. Patient states that she has had no palpitations noted. She states that her knee just gave. In the ED when she initially was noted to have a heart rate of 38 but now she is in the 60s. She gets dialysis on [facility]  Review of Systems:  Constitutional:  No weight loss, night sweats, Fevers, chills, fatigue.  HEENT:  No headaches, post nasal drip,  Cardio-vascular:  No chest pain, Orthopnea, PND, swelling in lower extremities GI:  No heartburn, indigestion, abdominal pain, nausea, vomiting, diarrhea  Resp:  No shortness of breath with exertion or at rest. No coughing up of blood Skin:  no rash or lesions.  GU:  no dysuria, change in color of urine Musculoskeletal:  +right knee pain Psych:  No change in mood or affect  Past Medical History  Diagnosis Date  . Renal disorder   . ESRD (end stage renal disease) ([place])   . Hypertension   . Multiple myeloma ([place]) 2009  . Thyroid disease   . Hyperparathyroidism ([place])   . Thrombocytopenia ([place])   . Anemia   . Shortness of breath   . Pneumonia   . GERD (gastroesophageal reflux disease)   . Arthritis     rt knee is stiff  . Multiple myeloma [facility])    Past Surgical History  Procedure Laterality Date  . Abdominal hysterectomy      Pt. denies   Social History:  reports that she has never smoked. She has never used smokeless tobacco. She  reports that she does not drink alcohol or use illicit drugs.  No Known Allergies  Family History  Problem Relation Age of Onset  . Hypertension Mother   . Hypertension Father      Prior to Admission medications   Medication Sig Start Date End Date Taking? Authorizing Provider  amLODipine (NORVASC) 10 MG tablet Take 0.5 tablets (5 mg total) by mouth daily. 03/21/15  Yes Josalyn Funches, MD  cinacalcet (SENSIPAR) 30 MG tablet Take 30 mg by mouth daily.   Yes Historical Provider, MD  furosemide (LASIX) 20 MG tablet Take 1 tablet (20 mg total) by mouth daily. PRN for leg edema 03/21/15  Yes Josalyn Funches, MD  hydrALAZINE (APRESOLINE) 100 MG tablet Take 100 mg by mouth 3 (three) times daily. 03/25/15  Yes Historical Provider, MD  isosorbide mononitrate (IMDUR) 60 MG 24 hr tablet Take 60 mg by mouth daily. 03/24/15  Yes Historical Provider, MD  multivitamin (RENA-VIT) TABS tablet Take 1 tablet by mouth at bedtime. 04/13/15  Yes Annita Brod, MD  Nutritional Supplements (FEEDING SUPPLEMENT, NEPRO CARB STEADY,) LIQD Take 237 mLs by mouth 2 (two) times daily between meals. 10/19/14  Yes Karlene Einstein, MD  ondansetron (ZOFRAN ODT) 4 MG disintegrating tablet Take 1 tablet (4 mg total) by mouth every 8 (eight) hours as needed for nausea or vomiting. 01/21/15  Yes Adeleke  Claudine Mouton, MD  prochlorperazine (COMPAZINE) 10 MG tablet Take 1 tablet (10 mg total) by mouth every 6 (six) hours as needed (Nausea or vomiting). 03/25/14  Yes Concha Norway, MD  valACYclovir (VALTREX) 500 MG tablet Take 500 mg by mouth daily.   Yes Historical Provider, MD  traZODone (DESYREL) 50 MG tablet Take 0.5-1 tablets (25-50 mg total) by mouth at bedtime as needed for sleep. 02/23/15   Josalyn Funches, MD  zolpidem (AMBIEN) 5 MG tablet Take 1 tablet (5 mg total) by mouth at bedtime as needed for sleep. 02/11/15   Heath Lark, MD   Physical Exam: Filed Vitals:   09/01/15 2100 09/01/15 2115 09/01/15 2245 09/01/15 2300  BP: 174/107 188/98  157/92 156/93  Pulse: 67 36 63 61  Temp:      TempSrc:      Resp: _0 SpO2: 100% 100% 96% 100%    Wt Readings from Last 3 Encounters:  08/29/15 61.735 kg (136 lb 1.6 oz)  04/29/15 61.236 kg (135 lb)  04/13/15 56.564 kg (124 lb 11.2 oz)    General:  Appears calm and comfortable Eyes: PERRL, normal lids, irises & conjunctiva ENT: grossly normal hearing, lips & tongue Neck: no LAD, masses or thyromegaly Cardiovascular: RRR, no m/r/g. No LE edema. Telemetry: SR, no arrhythmias  Respiratory: CTA bilaterally, no w/r/r. Normal respiratory effort. Abdomen: soft, ntnd Skin: no rash or induration seen on limited exam Musculoskeletal: grossly normal tone BUE/BLE Psychiatric: grossly normal mood and affect, speech fluent and appropriate Neurologic: grossly non-focal.          Labs on Admission:  Basic Metabolic Panel:  Recent Labs Lab 09/01/15 2312  NA 135  K 4.4  CL 95*  CO2 34*  GLUCOSE 138*  BUN 25*  CREATININE 5.23*  CALCIUM 9.1   Liver Function Tests: No results for input(s): AST, ALT, ALKPHOS, BILITOT, PROT, ALBUMIN in the last 168 hours. No results for input(s): LIPASE, AMYLASE in the last 168 hours. No results for input(s): AMMONIA in the last 168 hours. CBC:  Recent Labs Lab 09/01/15 2312  WBC 3.8*  NEUTROABS 3.3  HGB 10.7*  HCT 34.8*  MCV 103.0*  PLT 162   Cardiac Enzymes: No results for input(s): CKTOTAL, CKMB, CKMBINDEX, TROPONINI in the last 168 hours.  BNP (last 3 results) No results for input(s): BNP in the last 8760 hours.  ProBNP (last 3 results) No results for input(s): PROBNP in the last 8760 hours.  CBG: No results for input(s): GLUCAP in the last 168 hours.  Radiological Exams on Admission: Dg Knee Complete 4 Views Right  09/01/2015  CLINICAL DATA:  Status post fall.  Right knee pain and deformity. EXAM: RIGHT KNEE - COMPLETE 4+ VIEW COMPARISON:  None. FINDINGS: There is severe osteopenia. Severely comminuted fracture of the  distal femoral metaphysis. There is a vertically oriented fracture cleft involving the lateral femoral condyle. There is anterior displacement. There is no dislocation. There is a large joint effusion. There is peripheral vascular atherosclerotic disease. IMPRESSION: 1. Severely comminuted fracture of the distal femoral metaphysis with a vertically oriented fracture cleft involving the lateral femoral condyle. Electronically Signed   By: Kathreen Devoid   On: 09/01/2015 22:49   Dg Hip Unilat With Pelvis 2-3 Views Right  09/01/2015  CLINICAL DATA:  74 year old female with fall and right knee pain. No hip complaint. EXAM: DG HIP (WITH OR WITHOUT PELVIS) 2-3V RIGHT COMPARISON:  Radiograph dated 04/10/2015 FINDINGS: There is no evidence of hip fracture  or dislocation. There is degenerative changes of the visualized lower spine. Stable appearing calcific density in the right hemipelvis may represent a partially calcified fibroid or a calcified ovarian lesion. Pelvic ultrasound may provide better evaluation. IMPRESSION: No fracture or dislocation. Electronically Signed   By: Anner Crete M.D.   On: 09/01/2015 22:52      Assessment/Plan Principal Problem:   Closed fracture of right distal femur (HCC) Active Problems:   ESRD (end stage renal disease) ([place])   Hypertension associatd with end stage renal disease on dialysis   GERD (gastroesophageal reflux disease)   1. Closed Right distal femur fracture -orthopedics is aware of the patient -will be planning on surgery in am -pain control  2. ESRD on HD -will need nephrology consultation in am  3. HTN -will monitor pressures -continue with home medications  4. GERD -PPI as needed    Code Status: full code DVT Prophylaxis:SCD Family Communication: none Disposition Plan: home    [facility] Pager [phone]

## 2015-09-01 NOTE — ED Notes (Signed)
Family at bedside. 

## 2015-09-02 DIAGNOSIS — E440 Moderate protein-calorie malnutrition: Secondary | ICD-10-CM

## 2015-09-02 DIAGNOSIS — D701 Agranulocytosis secondary to cancer chemotherapy: Secondary | ICD-10-CM | POA: Diagnosis present

## 2015-09-02 DIAGNOSIS — T451X5A Adverse effect of antineoplastic and immunosuppressive drugs, initial encounter: Secondary | ICD-10-CM | POA: Diagnosis present

## 2015-09-02 DIAGNOSIS — D631 Anemia in chronic kidney disease: Secondary | ICD-10-CM | POA: Diagnosis present

## 2015-09-02 DIAGNOSIS — W010XXA Fall on same level from slipping, tripping and stumbling without subsequent striking against object, initial encounter: Secondary | ICD-10-CM | POA: Diagnosis present

## 2015-09-02 DIAGNOSIS — W19XXXS Unspecified fall, sequela: Secondary | ICD-10-CM | POA: Diagnosis not present

## 2015-09-02 DIAGNOSIS — N2581 Secondary hyperparathyroidism of renal origin: Secondary | ICD-10-CM | POA: Diagnosis present

## 2015-09-02 DIAGNOSIS — Z6822 Body mass index (BMI) 22.0-22.9, adult: Secondary | ICD-10-CM | POA: Diagnosis not present

## 2015-09-02 DIAGNOSIS — Z992 Dependence on renal dialysis: Secondary | ICD-10-CM | POA: Diagnosis not present

## 2015-09-02 DIAGNOSIS — D630 Anemia in neoplastic disease: Secondary | ICD-10-CM | POA: Diagnosis not present

## 2015-09-02 DIAGNOSIS — S72401A Unspecified fracture of lower end of right femur, initial encounter for closed fracture: Secondary | ICD-10-CM

## 2015-09-02 DIAGNOSIS — S72491S Other fracture of lower end of right femur, sequela: Secondary | ICD-10-CM | POA: Diagnosis not present

## 2015-09-02 DIAGNOSIS — S72421A Displaced fracture of lateral condyle of right femur, initial encounter for closed fracture: Secondary | ICD-10-CM | POA: Diagnosis present

## 2015-09-02 DIAGNOSIS — E213 Hyperparathyroidism, unspecified: Secondary | ICD-10-CM | POA: Diagnosis present

## 2015-09-02 DIAGNOSIS — M25561 Pain in right knee: Secondary | ICD-10-CM | POA: Diagnosis present

## 2015-09-02 DIAGNOSIS — C9000 Multiple myeloma not having achieved remission: Secondary | ICD-10-CM | POA: Diagnosis present

## 2015-09-02 DIAGNOSIS — N186 End stage renal disease: Secondary | ICD-10-CM | POA: Diagnosis present

## 2015-09-02 DIAGNOSIS — E875 Hyperkalemia: Secondary | ICD-10-CM | POA: Diagnosis present

## 2015-09-02 DIAGNOSIS — K219 Gastro-esophageal reflux disease without esophagitis: Secondary | ICD-10-CM | POA: Diagnosis present

## 2015-09-02 DIAGNOSIS — I120 Hypertensive chronic kidney disease with stage 5 chronic kidney disease or end stage renal disease: Secondary | ICD-10-CM | POA: Diagnosis present

## 2015-09-02 HISTORY — DX: Moderate protein-calorie malnutrition: E44.0

## 2015-09-02 LAB — TYPE AND SCREEN
ABO/RH(D): O POS
Antibody Screen: NEGATIVE

## 2015-09-02 LAB — CBC
HCT: 31.4 % — ABNORMAL LOW (ref 36.0–46.0)
Hemoglobin: 9.6 g/dL — ABNORMAL LOW (ref 12.0–15.0)
MCH: 31.4 pg (ref 26.0–34.0)
MCHC: 30.6 g/dL (ref 30.0–36.0)
MCV: 102.6 fL — AB (ref 78.0–100.0)
PLATELETS: 160 10*3/uL (ref 150–400)
RBC: 3.06 MIL/uL — ABNORMAL LOW (ref 3.87–5.11)
RDW: 16.6 % — AB (ref 11.5–15.5)
WBC: 4.2 10*3/uL (ref 4.0–10.5)

## 2015-09-02 LAB — BASIC METABOLIC PANEL
Anion gap: 9 (ref 5–15)
BUN: 28 mg/dL — AB (ref 6–20)
CALCIUM: 8.6 mg/dL — AB (ref 8.9–10.3)
CO2: 30 mmol/L (ref 22–32)
CREATININE: 5.69 mg/dL — AB (ref 0.44–1.00)
Chloride: 96 mmol/L — ABNORMAL LOW (ref 101–111)
GFR calc Af Amer: 8 mL/min — ABNORMAL LOW (ref >60)
GFR, EST NON AFRICAN AMERICAN: 7 mL/min — AB (ref >60)
GLUCOSE: 95 mg/dL (ref 65–99)
Potassium: 5.2 mmol/L — ABNORMAL HIGH (ref 3.5–5.1)
Sodium: 135 mmol/L (ref 135–145)

## 2015-09-02 MED ORDER — METHOCARBAMOL 1000 MG/10ML IJ SOLN
500.0000 mg | Freq: Four times a day (QID) | INTRAVENOUS | Status: DC | PRN
  Filled 2015-09-02: qty 5

## 2015-09-02 MED ORDER — ONDANSETRON 4 MG PO TBDP
4.0000 mg | ORAL_TABLET | Freq: Three times a day (TID) | ORAL | Status: DC | PRN
  Filled 2015-09-02: qty 1

## 2015-09-02 MED ORDER — SODIUM CHLORIDE 0.9 % IV SOLN
INTRAVENOUS | Status: DC
  Administered 2015-09-02 (×2): via INTRAVENOUS

## 2015-09-02 MED ORDER — POLYETHYLENE GLYCOL 3350 17 G PO PACK: 17.0000 g | PACK | Freq: Every day | ORAL | Status: DC | PRN

## 2015-09-02 MED ORDER — ZOLPIDEM TARTRATE 5 MG PO TABS: 5.0000 mg | ORAL_TABLET | Freq: Every evening | ORAL | Status: DC | PRN

## 2015-09-02 MED ORDER — HYDRALAZINE HCL 100 MG PO TABS
100.0000 mg | ORAL_TABLET | Freq: Three times a day (TID) | ORAL | Status: DC
  Administered 2015-09-02 – 2015-09-03 (×5): 100 mg via ORAL
  Filled 2015-09-02 (×9): qty 1

## 2015-09-02 MED ORDER — AMLODIPINE BESYLATE 5 MG PO TABS
5.0000 mg | ORAL_TABLET | Freq: Every day | ORAL | Status: DC
  Administered 2015-09-02 – 2015-09-03 (×2): 5 mg via ORAL
  Filled 2015-09-02 (×2): qty 1

## 2015-09-02 MED ORDER — MORPHINE SULFATE (PF) 2 MG/ML IV SOLN
0.5000 mg | INTRAVENOUS | Status: DC | PRN
  Administered 2015-09-02 (×3): 0.5 mg via INTRAVENOUS
  Filled 2015-09-02 (×4): qty 1

## 2015-09-02 MED ORDER — NEPRO/CARBSTEADY PO LIQD
237.0000 mL | Freq: Two times a day (BID) | ORAL | Status: DC
Start: 2015-09-02 — End: 2015-09-02
  Filled 2015-09-02 (×4): qty 237

## 2015-09-02 MED ORDER — FERROUS SULFATE 325 (65 FE) MG PO TABS
325.0000 mg | ORAL_TABLET | Freq: Three times a day (TID) | ORAL | Status: DC
  Administered 2015-09-02 – 2015-09-03 (×4): 325 mg via ORAL
  Filled 2015-09-02 (×4): qty 1

## 2015-09-02 MED ORDER — PROMETHAZINE HCL 25 MG/ML IJ SOLN
12.5000 mg | Freq: Once | INTRAMUSCULAR | Status: AC
  Administered 2015-09-02: 12.5 mg via INTRAVENOUS
  Filled 2015-09-02: qty 1

## 2015-09-02 MED ORDER — MAGNESIUM CITRATE PO SOLN: 1.0000 | Freq: Once | ORAL | Status: DC | PRN

## 2015-09-02 MED ORDER — CINACALCET HCL 30 MG PO TABS
30.0000 mg | ORAL_TABLET | Freq: Every day | ORAL | Status: DC
  Administered 2015-09-02 – 2015-09-08 (×7): 30 mg via ORAL
  Filled 2015-09-02 (×7): qty 1

## 2015-09-02 MED ORDER — FUROSEMIDE 20 MG PO TABS
20.0000 mg | ORAL_TABLET | Freq: Every day | ORAL | Status: DC
  Administered 2015-09-02 – 2015-09-07 (×6): 20 mg via ORAL
  Filled 2015-09-02 (×6): qty 1

## 2015-09-02 MED ORDER — VALACYCLOVIR HCL 500 MG PO TABS
500.0000 mg | ORAL_TABLET | Freq: Every day | ORAL | Status: DC
  Administered 2015-09-02 – 2015-09-08 (×7): 500 mg via ORAL
  Filled 2015-09-02 (×7): qty 1

## 2015-09-02 MED ORDER — METHOCARBAMOL 500 MG PO TABS
500.0000 mg | ORAL_TABLET | Freq: Four times a day (QID) | ORAL | Status: DC | PRN
  Administered 2015-09-03: 500 mg via ORAL
  Filled 2015-09-02: qty 1

## 2015-09-02 MED ORDER — ISOSORBIDE MONONITRATE ER 60 MG PO TB24
60.0000 mg | ORAL_TABLET | Freq: Every day | ORAL | Status: DC
  Administered 2015-09-02 – 2015-09-08 (×7): 60 mg via ORAL
  Filled 2015-09-02 (×7): qty 1

## 2015-09-02 MED ORDER — PROCHLORPERAZINE MALEATE 10 MG PO TABS
10.0000 mg | ORAL_TABLET | Freq: Four times a day (QID) | ORAL | Status: DC | PRN
  Filled 2015-09-02: qty 1

## 2015-09-02 MED ORDER — BISACODYL 10 MG RE SUPP
10.0000 mg | Freq: Every day | RECTAL | Status: DC | PRN
  Administered 2015-09-07: 10 mg via RECTAL
  Filled 2015-09-02: qty 1

## 2015-09-02 MED ORDER — HYDROCODONE-ACETAMINOPHEN 5-325 MG PO TABS
1.0000 | ORAL_TABLET | Freq: Four times a day (QID) | ORAL | Status: DC | PRN
  Administered 2015-09-02: 1 via ORAL
  Administered 2015-09-03: 2 via ORAL
  Administered 2015-09-04: 1 via ORAL
  Administered 2015-09-05 – 2015-09-06 (×2): 2 via ORAL
  Administered 2015-09-08: 1 via ORAL
  Filled 2015-09-02: qty 2
  Filled 2015-09-02: qty 1
  Filled 2015-09-02 (×3): qty 2
  Filled 2015-09-02: qty 1
  Filled 2015-09-02: qty 2
  Filled 2015-09-02 (×2): qty 1

## 2015-09-02 MED ORDER — BOOST / RESOURCE BREEZE PO LIQD
1.0000 | Freq: Three times a day (TID) | ORAL | Status: DC
  Administered 2015-09-02 – 2015-09-08 (×11): 1 via ORAL

## 2015-09-02 NOTE — Progress Notes (Signed)
Initial Nutrition Assessment  DOCUMENTATION CODES:   Non-severe (moderate) malnutrition in context of chronic illness  INTERVENTION:  Discontinue Nepro Shake.   Provide Boost Breeze po TID, each supplement provides 250 kcal and 9 grams of protein.  Encourage adequate PO intake.   NUTRITION DIAGNOSIS:   Malnutrition related to chronic illness as evidenced by moderate depletion of body fat, severe depletion of muscle mass.  GOAL:   Patient will meet greater than or equal to 90% of their needs  MONITOR:   PO intake, Supplement acceptance, Weight trends, Labs, I & O's, Skin  REASON FOR ASSESSMENT:   Consult Hip fracture protocol  ASSESSMENT:   74 y.o. female with history of ESRD on HD HTN Multiple Myeloma presents after a fall. Pt with R femur fx. Plans for surgery Sunday.   Diet has been advanced. No meal completion recorded, however pt reports having a decreased appetite. She reports she has been however consuming at least 3 meals a day at home PTA. Weight stable per weight records. Pt currently has Nepro Shake ordered and has been refusing them. Pt reports she does not like Nepro or Ensure shakes as it causes her n/v. Pt is agreeable to Boost Breeze instead. RD to order. Pt was encouraged to eat her food at meals and to drink her supplements.   Nutrition-Focused physical exam completed. Findings are moderate fat depletion, moderate to severe muscle depletion, and no edema.   Labs: Low chloride, calcium, and GFR. High potassium (5.2), BUN, and creatinine.   Diet Order:  Diet regular Room service appropriate?: Yes; Fluid consistency:: Thin  Skin:  Reviewed, no issues  Last BM:  11/30  Height:   Ht Readings from Last 1 Encounters:  08/29/15 _0  (1.676 m)    Weight:   Wt Readings from Last 1 Encounters:  08/29/15 136 lb 1.6 oz (61.735 kg)    Ideal Body Weight:  59 kg  BMI:  Body Mass Index: 21.94 kg/(m^2)  Estimated Nutritional Needs:   Kcal:   1700-1900  Protein:  80-90 grams  Fluid:  Per MD  EDUCATION NEEDS:   No education needs identified at this time  Corrin Parker, MS, RD, LDN Pager # [phone] After hours/ weekend pager # [phone]

## 2015-09-02 NOTE — Progress Notes (Signed)
PT Cancellation Note  Patient Details Name: [name] MRN: [id number] DOB: 08/23/1941   Cancelled Treatment:    Reason Eval/Treat Not Completed: Medical issues which prohibited therapy. Pt scheduled for R distal femur replacement on Sunday.  Will d/c PT orders at this time and await post-op orders from ortho MD.  Thank you.   Lyzbeth Genrich LUBECK 09/02/2015, 11:44 AM

## 2015-09-02 NOTE — Consult Note (Signed)
ORTHOPAEDIC CONSULTATION  REQUESTING PHYSICIAN: Allyne Gee, MD  Chief Complaint: Right distal femur fracture  HPI: [name] is a 74 y.o. female who presents with right distal femur fracture s/p mechanical fall.  The patient endorses severe pain in the right knee, that does not radiate, grinding in quality, worse with any movement, better with immobilization.  Denies LOC/fever/chills/nausea/vomiting.  Walks with assistive devices (walker, cane, wheelchair).  Does live indepedently.  Patient has multiple myeloma.  Denies any antecedent pain in the knee or any fracture prior to the fall.    Past Medical History  Diagnosis Date  . Renal disorder   . ESRD (end stage renal disease) ([place])   . Hypertension   . Multiple myeloma ([place]) 2009  . Thyroid disease   . Hyperparathyroidism ([place])   . Thrombocytopenia ([place])   . Anemia   . Shortness of breath   . Pneumonia   . GERD (gastroesophageal reflux disease)   . Arthritis     rt knee is stiff  . Multiple myeloma [facility])    Past Surgical History  Procedure Laterality Date  . Abdominal hysterectomy      Pt. denies   Social History   Social History  . Marital Status: Married    Spouse Name: N/A  . Number of Children: 3  . Years of Education: N/A   Occupational History  . Retired      Pharmacist, hospital asst.   Social History Main Topics  . Smoking status: Never Smoker   . Smokeless tobacco: Never Used  . Alcohol Use: No  . Drug Use: No  . Sexual Activity: No   Other Topics Concern  . None   Social History Narrative   Patient recently moved to [place] from [place] in May 2015.   [name] was born in [place], [place].   Patient has brother in [place] area.   Family History  Problem Relation Age of Onset  . Hypertension Mother   . Hypertension Father    No Known Allergies Prior to Admission medications   Medication Sig Start Date End Date Taking? Authorizing Provider  amLODipine (NORVASC) 10 MG tablet Take 0.5 tablets (5  mg total) by mouth daily. 03/21/15  Yes Josalyn Funches, MD  cinacalcet (SENSIPAR) 30 MG tablet Take 30 mg by mouth daily.   Yes Historical Provider, MD  furosemide (LASIX) 20 MG tablet Take 1 tablet (20 mg total) by mouth daily. PRN for leg edema 03/21/15  Yes Josalyn Funches, MD  hydrALAZINE (APRESOLINE) 100 MG tablet Take 100 mg by mouth 3 (three) times daily. 03/25/15  Yes Historical Provider, MD  isosorbide mononitrate (IMDUR) 60 MG 24 hr tablet Take 60 mg by mouth daily. 03/24/15  Yes Historical Provider, MD  multivitamin (RENA-VIT) TABS tablet Take 1 tablet by mouth at bedtime. 04/13/15  Yes Annita Brod, MD  Nutritional Supplements (FEEDING SUPPLEMENT, NEPRO CARB STEADY,) LIQD Take 237 mLs by mouth 2 (two) times daily between meals. 10/19/14  Yes Karlene Einstein, MD  ondansetron (ZOFRAN ODT) 4 MG disintegrating tablet Take 1 tablet (4 mg total) by mouth every 8 (eight) hours as needed for nausea or vomiting. 01/21/15  Yes Everlene Balls, MD  prochlorperazine (COMPAZINE) 10 MG tablet Take 1 tablet (10 mg total) by mouth every 6 (six) hours as needed (Nausea or vomiting). 03/25/14  Yes Concha Norway, MD  valACYclovir (VALTREX) 500 MG tablet Take 500 mg by mouth daily.   Yes Historical Provider, MD  traZODone (DESYREL) 50 MG  tablet Take 0.5-1 tablets (25-50 mg total) by mouth at bedtime as needed for sleep. 02/23/15   Josalyn Funches, MD  zolpidem (AMBIEN) 5 MG tablet Take 1 tablet (5 mg total) by mouth at bedtime as needed for sleep. 02/11/15   Heath Lark, MD   Ct Knee Right Wo Contrast  09/02/2015  CLINICAL DATA:  Distal femoral fracture EXAM: CT OF THE right KNEE WITHOUT CONTRAST TECHNIQUE: Multidetector CT imaging of the right knee was performed according to the standard protocol. Multiplanar CT image reconstructions were also generated. COMPARISON:  Radiographs 09/01/2015 FINDINGS: There is a complex severely comminuted distal femoral fracture. There is no evidence of an underlying bone lesion to  suggest a pathologic basis of the fracture. There is a large lipohemarthrosis. The fracture includes a transverse component across the diametaphyseal junction and a sagittal fracture through the intercondylar notch. The condyles are mildly rotated anteriorly. There is mild impaction. There is mild splaying of the condyles at the sagittal intercondylar fracture, with mild lateral subluxation of the lateral femoral condylar fragment with respect to the lateral tibial plateau. The medial compartment articular relationships appear maintained. Patella and extensor mechanism appear intact. Proximal tibia, proximal fibula and the proximal tibial fibular articulation appear intact. IMPRESSION: Severely comminuted complex distal femoral fracture. Mild lateral subluxation of the lateral femoral condyle fragment respect to the lateral tibial plateau. Electronically Signed   By: Andreas Newport M.D.   On: 09/02/2015 00:02   Dg Knee Complete 4 Views Right  09/01/2015  CLINICAL DATA:  Status post fall.  Right knee pain and deformity. EXAM: RIGHT KNEE - COMPLETE 4+ VIEW COMPARISON:  None. FINDINGS: There is severe osteopenia. Severely comminuted fracture of the distal femoral metaphysis. There is a vertically oriented fracture cleft involving the lateral femoral condyle. There is anterior displacement. There is no dislocation. There is a large joint effusion. There is peripheral vascular atherosclerotic disease. IMPRESSION: 1. Severely comminuted fracture of the distal femoral metaphysis with a vertically oriented fracture cleft involving the lateral femoral condyle. Electronically Signed   By: Kathreen Devoid   On: 09/01/2015 22:49   Dg Hip Unilat With Pelvis 2-3 Views Right  09/01/2015  CLINICAL DATA:  74 year old female with fall and right knee pain. No hip complaint. EXAM: DG HIP (WITH OR WITHOUT PELVIS) 2-3V RIGHT COMPARISON:  Radiograph dated 04/10/2015 FINDINGS: There is no evidence of hip fracture or dislocation.  There is degenerative changes of the visualized lower spine. Stable appearing calcific density in the right hemipelvis may represent a partially calcified fibroid or a calcified ovarian lesion. Pelvic ultrasound may provide better evaluation. IMPRESSION: No fracture or dislocation. Electronically Signed   By: Anner Crete M.D.   On: 09/01/2015 22:52    Positive ROS: All other systems have been reviewed and were otherwise negative with the exception of those mentioned in the HPI and as above.  Physical Exam: General: Alert, no acute distress Cardiovascular: No pedal edema Respiratory: No cyanosis, no use of accessory musculature GI: No organomegaly, abdomen is soft and non-tender Skin: No lesions in the area of chief complaint Neurologic: Sensation intact distally Psychiatric: Patient is competent for consent with normal mood and affect Lymphatic: No axillary or cervical lymphadenopathy  MUSCULOSKELETAL:  - severe pain with movement of the knee and extremity - skin intact - NVI distally - compartments soft  Assessment: Right distal femur fracture  Plan: - surgery is recommended, patient and family are aware of r/b/a and wish to proceed - medical optimization per primary  team - patient will need a distal femur replacement which will need to be ordered from [facility] - surgery will likely not occur until Sunday in order to allow for preop planning and gathering of all the appropriate components - I will be by later today to discuss further with patient and family  Thank you for the consult and the opportunity to see Ms. Clare Gandy. Eduard Roux, MD [place] 7:04 AM

## 2015-09-02 NOTE — Progress Notes (Signed)
Patient ID: [name], female   DOB: 01/20/1941, 74 y.o.   MRN: [id number]  [facility] PROGRESS NOTE  [name] [id number]:[id number] DOB: 1940-10-02 DOA: 09/01/2015 PCP: Benito Mccreedy, MD   Brief narrative:    74 y.o. female with history of ESRD on HD TTS, HTN Multiple Myeloma presents after a fall on her right side.   In ED, pt noted to have HR in 30's which quickly resolved to HR in 60's. Imaging studies notable for right severely comminuted distal femoral fractures. Ortho consulted and [facility] asked to admit for further evaluation.   Assessment/Plan:    Principal Problem:   Closed fracture of right distal femur (H[facility]) - still in pain this AM - plan for surgery on Sunday - will plan to keep NPO on Saturday midnight - provide analgesia as needed - PT evaluation post op - please note pt needs pre op 12 lead EKG and CXR, will order for AM  Active Problems:   ESRD (end stage renal disease) ([place]) with hyperkalemia  - will need to consult nephrology in AM for HD - address electrolytes with HD    Hypertension associatd with end stage renal disease on dialysis - reasonable inpatient control     Leukopenia - resolved     Anemia of ESRD - no indication for transfusion  - CBC in AM    GERD (gastroesophageal reflux disease) - continue Protonix     Malnutrition of moderate degree - advance diet as pt able to tolerate   DVT prophylaxis - SCD's  Code Status: Full.  Family Communication:  plan of care discussed with the patient Disposition Plan: Need hip repair which is planned for Sunday   IV access:  Peripheral IV  Procedures and diagnostic studies:    Ct Knee Right Wo Contrast 09/02/2015  Severely comminuted complex distal femoral fracture. Mild lateral subluxation of the lateral femoral condyle fragment respect to the lateral tibial plateau.   Dg Knee Complete 4 Views Right 09/01/2015  Severely comminuted fracture of the distal femoral metaphysis with a vertically  oriented fracture cleft involving the lateral femoral condyle.   Dg Hip Unilat With Pelvis 2-3 Views Right 09/01/2015  No fracture or dislocation.  Medical Consultants:  Ortho   Other Consultants:  PT  IAnti-Infectives:   None   Faye Ramsay, MD  [facility] Pager [phone]  If 7PM-7AM, please contact night-coverage [email or web address] Password [facility] 09/02/2015, 6:11 PM   LOS: 0 days   HPI/Subjective: No events overnight. Still in pain, 7/10 and wants to eat.   Objective: Filed Vitals:   09/02/15 0030 09/02/15 0053 09/02/15 0530 09/02/15 1300  BP: 173/105 190/97 177/97 160/89  Pulse: 60 57 61 69  Temp:  97.9 F (36.6 C) 98.4 F (36.9 C) 99.6 F (37.6 C)  TempSrc:  Oral Oral   Resp: _0 SpO2: 98% 100% 100% 100%    Intake/Output Summary (Last 24 hours) at 09/02/15 1811 Last data filed at 09/02/15 1300  Gross per 24 hour  Intake    480 ml  Output      0 ml  Net    480 ml    Exam:   General:  Pt is alert, follows commands appropriately, not in acute distress  Cardiovascular: Regular rate and rhythm, no rubs, no gallops  Respiratory: Clear to auscultation bilaterally, no wheezing, diminished breath sounds at bases   Abdomen: Soft, non tender, non distended, bowel sounds present, no guarding  Extremities: TTP in bilateral hip area  Data Reviewed: Basic Metabolic Panel:  Recent Labs Lab 09/01/15 2312 09/02/15 0720  NA 135 135  K 4.4 5.2*  CL 95* 96*  CO2 34* 30  GLUCOSE 138* 95  BUN 25* 28*  CREATININE 5.23* 5.69*  CALCIUM 9.1 8.6*   CBC:  Recent Labs Lab 09/01/15 2312 09/02/15 0720  WBC 3.8* 4.2  NEUTROABS 3.3  --   HGB 10.7* 9.6*  HCT 34.8* 31.4*  MCV 103.0* 102.6*  PLT 162 160   Scheduled Meds: . amLODipine  5 mg Oral Daily  . cinacalcet  30 mg Oral Q breakfast  . feeding supplement  1 Container Oral TID BM  . ferrous sulfate  325 mg Oral TID PC  . furosemide  20 mg Oral Daily  . hydrALAZINE  100 mg Oral TID  . isosorbide  mononitrate  60 mg Oral Daily  . valACYclovir  500 mg Oral Daily   Continuous Infusions: . sodium chloride 50 mL/hr at 09/02/15 0147

## 2015-09-02 NOTE — Progress Notes (Signed)
Utilization review completed.  

## 2015-09-02 NOTE — Progress Notes (Addendum)
Paged on call hospitalist to make aware of high BP. Will await further instructions.

## 2015-09-03 ENCOUNTER — Encounter (HOSPITAL_COMMUNITY): Payer: Self-pay | Admitting: Internal Medicine

## 2015-09-03 ENCOUNTER — Inpatient Hospital Stay (HOSPITAL_COMMUNITY): Payer: Medicare Other

## 2015-09-03 ENCOUNTER — Encounter (HOSPITAL_COMMUNITY): Payer: Self-pay | Admitting: Anesthesiology

## 2015-09-03 DIAGNOSIS — D701 Agranulocytosis secondary to cancer chemotherapy: Secondary | ICD-10-CM | POA: Diagnosis present

## 2015-09-03 DIAGNOSIS — T451X5A Adverse effect of antineoplastic and immunosuppressive drugs, initial encounter: Secondary | ICD-10-CM

## 2015-09-03 DIAGNOSIS — N185 Chronic kidney disease, stage 5: Secondary | ICD-10-CM

## 2015-09-03 DIAGNOSIS — E875 Hyperkalemia: Secondary | ICD-10-CM

## 2015-09-03 DIAGNOSIS — D631 Anemia in chronic kidney disease: Secondary | ICD-10-CM

## 2015-09-03 LAB — CBC
HCT: 28.5 % — ABNORMAL LOW (ref 36.0–46.0)
Hemoglobin: 8.7 g/dL — ABNORMAL LOW (ref 12.0–15.0)
MCH: 31.1 pg (ref 26.0–34.0)
MCHC: 30.5 g/dL (ref 30.0–36.0)
MCV: 101.8 fL — AB (ref 78.0–100.0)
PLATELETS: 167 10*3/uL (ref 150–400)
RBC: 2.8 MIL/uL — ABNORMAL LOW (ref 3.87–5.11)
RDW: 16.5 % — AB (ref 11.5–15.5)
WBC: 3.9 10*3/uL — ABNORMAL LOW (ref 4.0–10.5)

## 2015-09-03 LAB — RENAL FUNCTION PANEL
ANION GAP: 7 (ref 5–15)
Albumin: 2.6 g/dL — ABNORMAL LOW (ref 3.5–5.0)
BUN: 46 mg/dL — AB (ref 6–20)
CO2: 29 mmol/L (ref 22–32)
Calcium: 8.9 mg/dL (ref 8.9–10.3)
Chloride: 94 mmol/L — ABNORMAL LOW (ref 101–111)
Creatinine, Ser: 7.66 mg/dL — ABNORMAL HIGH (ref 0.44–1.00)
GFR calc Af Amer: 5 mL/min — ABNORMAL LOW (ref >60)
GFR calc non Af Amer: 5 mL/min — ABNORMAL LOW (ref >60)
GLUCOSE: 113 mg/dL — AB (ref 65–99)
POTASSIUM: 6.2 mmol/L — AB (ref 3.5–5.1)
Phosphorus: 5.6 mg/dL — ABNORMAL HIGH (ref 2.5–4.6)
Sodium: 130 mmol/L — ABNORMAL LOW (ref 135–145)

## 2015-09-03 LAB — BASIC METABOLIC PANEL
Anion gap: 10 (ref 5–15)
BUN: 30 mg/dL — AB (ref 6–20)
CALCIUM: 8.6 mg/dL — AB (ref 8.9–10.3)
CO2: 29 mmol/L (ref 22–32)
CREATININE: 5.15 mg/dL — AB (ref 0.44–1.00)
Chloride: 91 mmol/L — ABNORMAL LOW (ref 101–111)
GFR calc Af Amer: 9 mL/min — ABNORMAL LOW (ref >60)
GFR, EST NON AFRICAN AMERICAN: 7 mL/min — AB (ref >60)
GLUCOSE: 90 mg/dL (ref 65–99)
POTASSIUM: 4.1 mmol/L (ref 3.5–5.1)
Sodium: 130 mmol/L — ABNORMAL LOW (ref 135–145)

## 2015-09-03 MED ORDER — SODIUM CHLORIDE 0.9 % IV SOLN
125.0000 mg | INTRAVENOUS | Status: DC
  Administered 2015-09-08: 125 mg via INTRAVENOUS
  Filled 2015-09-03 (×2): qty 10

## 2015-09-03 MED ORDER — DARBEPOETIN ALFA 150 MCG/0.3ML IJ SOSY
150.0000 ug | PREFILLED_SYRINGE | INTRAMUSCULAR | Status: DC
  Administered 2015-09-08: 150 ug via INTRAVENOUS
  Filled 2015-09-03: qty 0.3

## 2015-09-03 MED ORDER — HEPARIN SODIUM (PORCINE) 5000 UNIT/ML IJ SOLN
5000.0000 [IU] | Freq: Three times a day (TID) | INTRAMUSCULAR | Status: DC
  Administered 2015-09-04 – 2015-09-08 (×9): 5000 [IU] via SUBCUTANEOUS
  Filled 2015-09-03 (×11): qty 1

## 2015-09-03 MED ORDER — DOXERCALCIFEROL 4 MCG/2ML IV SOLN
4.0000 ug | INTRAVENOUS | Status: DC
  Administered 2015-09-06 – 2015-09-08 (×2): 4 ug via INTRAVENOUS
  Filled 2015-09-03 (×2): qty 2

## 2015-09-03 NOTE — Progress Notes (Signed)
On call ordered another 12 lead EKG to be done for potassium 6.2; patient went to radiology. Will have on coming staff to follow up.

## 2015-09-03 NOTE — Progress Notes (Signed)
I have left voicemail for daughter to call me back to discuss whether to proceed with surgery or not given patient's risk of complications related to surgery.  Azucena Cecil, MD [facility] [phone] 2:06 PM

## 2015-09-03 NOTE — Progress Notes (Signed)
I had telephone conversation with daughter tonight about patient's condition.  She understands that the patient is at significant risk for periop complications approximately 15% and risk of death of 2-3%.  She also understands that the surgery would be to improve quality of life and ability to ambulate.  Without surgery, the patient would likely only be able to ambulate limited distances with a shortened and stiff leg.  She would mainly be wheelchair bound.  Patient is currently receiving chemo which does also increase risk for infection.  The daughter would like to discuss this further with the patient and family members before deciding.  The surgery will be tentatively scheduled for Wednesday.  I will follow up with the daughter this coming week.  Azucena Cecil, MD [place] 8:31 PM

## 2015-09-03 NOTE — Progress Notes (Addendum)
Patient ID: [name], female   DOB: 1940/10/04, 74 y.o.   MRN: [id number] [facility] PRO[facility]ESS [facility]  Betina Puckett SPQ:[id number] DOB: 1940-11-18 DOA: 09/01/2015 PCP: Benito Mccreedy, MD  Brief narrative:    74 y.o. female with past medical history of ESRD on HD TTS, HTN, Multiple Myeloma (under Dr. Calton Dach care) who presented to [facility] ED status post fall and sustaining right distal femoral fracture.     Assessment/Plan:    Principal Problem: Closed fracture of right distal femur ([place]) - Based on NSQIP surgical risk calculator pt is above average risk for serious complication, any complication, pneumonia, VTE, UTI, cardiac complications (her risk for those complications is about 33% and if we include MM her risk jumps to 15.6%), return to OR and death(2.3 - 2.7%) based on her age and comorbid conditions. Predicted length of stay after surgery may be up to 5-6 days based on surgical risk calculator. - Will see with orthopedic surgery if this surgery is an absolute must or are there any alternative options at this point  - Appreciate ortho recommendations - Continue supportive care with analgesia as needed for pain control   Active Problems: ESRD (end stage renal disease) ([place]) with hyperkalemia  - Consulted nephrology for HD  - Management per renal, appreciate renal following  - Continue sensipar   Hypertension associatd with end stage renal disease on dialysis - Continue Norvasc 5 mg daily  - Continue lasix 20 mg daily - Continue hydralazine 100 mg TID, Imdur 60 mg daily   Leukopenia - Likely due to history of multiple myeloma - Stable   Multiple myeloma - Recent visit in 03/2015 with Dr. Lottie Rater at which time recommendation was for palliative care and hospice but pt was not ready to accept this at that time - Below is a note from [name] in 03/2015 "I would not recommend any form of further palliative chemotherapy given poor response rates to further palliative  treatment and potential significant risk of side effects. I recommend palliative care and hospice but the patient cannot accept the concept of not going for further treatment. She cannot accept the fact that some of the side effects of treatment could potentially be life-threatening and can shorten her survivor without bringing any additional benefit. After very prolonged discussion, ultimately she wants to be referred to [facility] for second opinion."  Anemia of chronic disease - Likely combination of ERDS and multiple myeloma - Hemoglobin 8.7  GERD (gastroesophageal reflux disease) - Continue Protonix   Malnutrition of moderate degree - Nutrition consulted - In the context of chronic illness    DVT Prophylaxis  - SCD's bilaterally   Code Status: Full.  Family Communication:  plan of care discussed with the patient Disposition Plan: Needs surgery for distal femoral fracture. Based on surgical risk calculator she may have longer length of stay up to 5 days considering comorbid conditions including dialysis.    IV access:  Peripheral IV  Procedures and diagnostic studies:    Dg Chest 2 View 09/03/2015 Stable exam.  No evidence of active cardiopulmonary disease. Electronically Signed   By: Monte Fantasia M.D.   On: 09/03/2015 08:58   Ct Knee Right Wo Contrast 09/02/2015  Severely comminuted complex distal femoral fracture. Mild lateral subluxation of the lateral femoral condyle fragment respect to the lateral tibial plateau. Electronically Signed   By: Andreas Newport M.D.   On: 09/02/2015 00:02   Dg Knee Complete 4 Views Right 09/01/2015  1. Severely comminuted fracture  of the distal femoral metaphysis with a vertically oriented fracture cleft involving the lateral femoral condyle. Electronically Signed   By: Kathreen Devoid   On: 09/01/2015 22:49   Dg Hip Unilat With Pelvis 2-3 Views Right 09/01/2015 No fracture or dislocation. Electronically Signed   By: Anner Crete M.D.   On:  09/01/2015 22:52   Medical Consultants:  Orthopedic surgery   Other Consultants:  PT  Nutrition  IAnti-Infectives:   None    Leisa Lenz, MD  [facility] Pager [phone]  Time spent in minutes: 25 minutes  If 7PM-7AM, please contact night-coverage [email or web address] Password [email or web address] 09/03/2015, 10:07 AM   LOS: 1 day    HPI/Subjective: No acute overnight events. Patient reports pain in her leg.   Objective: Filed Vitals:   09/02/15 0530 09/02/15 1300 09/02/15 2022 09/03/15 0450  BP: 177/97 160/89 150/88 158/88  Pulse: 61 69 70 70  Temp: 98.4 F (36.9 C) 99.6 F (37.6 C) 98.1 F (36.7 C) 99.1 F (37.3 C)  TempSrc: Oral  Oral Oral  Resp: _0 SpO2: 100% 100% 100% 97%    Intake/Output Summary (Last 24 hours) at 09/03/15 1007 Last data filed at 09/03/15 0105  Gross per 24 hour  Intake    240 ml  Output     50 ml  Net    190 ml    Exam:   General:  Pt is alert, follows commands appropriately, not in acute distress  Cardiovascular: Regular rate and rhythm, S1/S2(+)  Respiratory: Clear to auscultation bilaterally, no wheezing, no crackles, no rhonchi  Abdomen: Soft, non tender, non distended, bowel sounds present  Extremities: trace pedal edema, pulses DP and PT palpable bilaterally  Neuro: Grossly nonfocal  Data Reviewed: Basic Metabolic Panel:  Recent Labs Lab 09/01/15 2312 09/02/15 0720 09/03/15 0444  NA 135 135 130*  K 4.4 5.2* 6.2*  CL 95* 96* 94*  CO2 34* 30 29  GLUCOSE 138* 95 113*  BUN 25* 28* 46*  CREATININE 5.23* 5.69* 7.66*  CALCIUM 9.1 8.6* 8.9  PHOS  --   --  5.6*   Liver Function Tests:  Recent Labs Lab 09/03/15 0444  ALBUMIN 2.6*   No results for input(s): LIPASE, AMYLASE in the last 168 hours. No results for input(s): AMMONIA in the last 168 hours. CBC:  Recent Labs Lab 09/01/15 2312 09/02/15 0720 09/03/15 0444  WBC 3.8* 4.2 3.9*  NEUTROABS 3.3  --   --   HGB 10.7* 9.6* 8.7*  HCT 34.8* 31.4* 28.5*   MCV 103.0* 102.6* 101.8*  PLT 162 160 167   Cardiac Enzymes: No results for input(s): CKTOTAL, CKMB, CKMBINDEX, TROPONINI in the last 168 hours. BNP: Invalid input(s): POCBNP CBG: No results for input(s): GLUCAP in the last 168 hours.  No results found for this or any previous visit (from the past 240 hour(s)).   Scheduled Meds: . amLODipine  5 mg Oral Daily  . cinacalcet  30 mg Oral Q breakfast  . feeding supplement  1 Container Oral TID BM  . ferrous sulfate  325 mg Oral TID PC  . furosemide  20 mg Oral Daily  . hydrALAZINE  100 mg Oral TID  . isosorbide mononitrate  60 mg Oral Daily  . valACYclovir  500 mg Oral Daily   Continuous Infusions:

## 2015-09-03 NOTE — Progress Notes (Signed)
OT Cancellation Note  Patient Details Name: Cariana Panzer MRN: [id number] DOB: [birth year]   Cancelled Treatment:    Reason Eval/Treat Not Completed: Other (comment) Surgery planned for Sunday. Will plan to see pt after they have surgery.   Benito Mccreedy OTR/L [id number] 09/03/2015, 8:19 AM

## 2015-09-03 NOTE — H&P (Signed)

## 2015-09-03 NOTE — Consult Note (Signed)
PHARMACY NOTE   Pharmacy Consult for :  Heparin Indication:  VTE prophylaxis  Dosing Weight: 62.5 kg kg  Labs:  Recent Labs  09/01/15 2312 09/02/15 0720 09/03/15 0444  HGB 10.7* 9.6* 8.7*  HCT 34.8* 31.4* 28.5*  PLT 162 160 167  CREATININE 5.23* 5.69* 7.66*   Estimated Creatinine Clearance: 5.8 mL/min (by C-G formula based on Cr of 7.66).  Assessment:  Patient is a 74 y/o female with history of ESRD who is to be placed on pharmacologic VTE prophylaxis.  Pharmacy asked to dose Heparin in prophylactic doses.  Goal:  Adequate prophylaxis without accumulation.   Plan: 1. Heparin 5000 units sq q 8 hours as per standard practice. 2. Recommend following CBC's, Platelet counts. 3. Pharmacy will sign off as there are practice warnings if dose adjustments or changes in drug selection needed.  Thank you for allowing Pharmacy to participate in this patient's care.  Marthenia Rolling,  Pharm.D   09/03/2015,  9:05 PM

## 2015-09-03 NOTE — Progress Notes (Signed)
Critical lab value this am, potassium 6.2.  On call notified via [place].  Patient is alert and oriented.  She is asymptomatic.

## 2015-09-03 NOTE — Consult Note (Signed)
[facility] Renal Consultation Note  Indication for Consultation:  Management of ESRD/hemodialysis; anemia, hypertension/volume and secondary hyperparathyroidism  HPI: [name] is a 74 y.o. female admitted with Closed fracture of right distal femur  after fall at home "my R knee went weak and I fell"/no reported LOC or other trauma. 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 Opinion Rx for her MM.Marland Kitchen  Seen by Ortho Dr. Erlinda Hong with plans to 'Sunday Dec 4 surgery for R Femur FX. Currently in room stating pain controlled ,K6.2( orders written for today HD)she is admitting Dietary indiscretion from her ESRD diet.     History reviewed. No pertinent past medical history.  Past Surgical History  Procedure Laterality Date  . Abdominal hysterectomy      Pt. denies      Family History  Problem Relation Age of Onset  . Hypertension Mother   . Hypertension Father       reports that she has never smoked. She has never used smokeless tobacco. She reports that she does not drink alcohol or use illicit drugs.  No Known Allergies  Prior to Admission medications   Medication Sig Start Date End Date Taking? Authorizing Provider  amLODipine (NORVASC) 10 MG tablet Take 0.5 tablets (5 mg total) by mouth daily. 03/21/15  Yes Josalyn Funches, MD  cinacalcet (SENSIPAR) 30 MG tablet Take 30 mg by mouth daily.   Yes Historical Provider, MD  furosemide (LASIX) 20 MG tablet Take 1 tablet (20 mg total) by mouth daily. PRN for leg edema 03/21/15  Yes Josalyn Funches, MD  hydrALAZINE (APRESOLINE) 100 MG tablet Take 100 mg by mouth 3 (three) times daily. 03/25/15  Yes Historical Provider, MD  isosorbide mononitrate (IMDUR) 60 MG 24 hr  tablet Take 60 mg by mouth daily. 03/24/15  Yes Historical Provider, MD  multivitamin (RENA-VIT) TABS tablet Take 1 tablet by mouth at bedtime. 04/13/15  Yes Sendil K Krishnan, MD  Nutritional Supplements (FEEDING SUPPLEMENT, NEPRO CARB STEADY,) LIQD Take 237 mLs by mouth 2 (two) times daily between meals. 10/19/14  Yes Julia E Mallory, MD  ondansetron (ZOFRAN ODT) 4 MG disintegrating tablet Take 1 tablet (4 mg total) by mouth every 8 (eight) hours as needed for nausea or vomiting. 01/21/15  Yes Adeleke Oni, MD  prochlorperazine (COMPAZINE) 10 MG tablet Take 1 tablet (10 mg total) by mouth every 6 (six) hours as needed (Nausea or vomiting). 03/25/14  Yes David Chism, MD  valACYclovir (VALTREX) 500 MG tablet Take 500 mg by mouth daily.   Yes Historical Provider, MD  traZODone (DESYREL) 50 MG tablet Take 0.5-1 tablets (25-50 mg total) by mouth at bedtime as needed for sleep. 02/23/15   Josalyn Funches, MD  zolpidem (AMBIEN) 5 MG tablet Take 1 tablet (5 mg total) by mouth at bedtime as needed for sleep. 02/11/15   Ni Gorsuch, MD     Anti-infectives    Start     Dose/Rate Route Frequency Ordered Stop   09/02/15 1000  valACYclovir (VALTREX) tablet 500 mg     50' 0 mg Oral Daily 09/02/15 0051        Results for orders placed or performed during the hospital encounter of 09/01/15 (from the past 48 hour(s))  Basic metabolic  panel     Status: Abnormal   Collection Time: 09/01/15 11:12 PM  Result Value Ref Range   Sodium 135 135 - 145 mmol/L   Potassium 4.4 3.5 - 5.1 mmol/L   Chloride 95 (L) 101 - 111 mmol/L   CO2 34 (H) 22 - 32 mmol/L   Glucose, Bld 138 (H) 65 - 99 mg/dL   BUN 25 (H) 6 - 20 mg/dL   Creatinine, Ser 5.23 (H) 0.44 - 1.00 mg/dL   Calcium 9.1 8.9 - 10.3 mg/dL   GFR calc non Af Amer 7 (L) >60 mL/min   GFR calc Af Amer 8 (L) >60 mL/min    Comment: (NOTE) The eGFR has been calculated using the CKD EPI equation. This calculation has not been validated in all clinical situations. eGFR's  persistently <60 mL/min signify possible Chronic Kidney Disease.    Anion gap 6 5 - 15  CBC with Differential     Status: Abnormal   Collection Time: 09/01/15 11:12 PM  Result Value Ref Range   WBC 3.8 (L) 4.0 - 10.5 K/uL   RBC 3.38 (L) 3.87 - 5.11 MIL/uL   Hemoglobin 10.7 (L) 12.0 - 15.0 g/dL   HCT 34.8 (L) 36.0 - 46.0 %   MCV 103.0 (H) 78.0 - 100.0 fL   MCH 31.7 26.0 - 34.0 pg   MCHC 30.7 30.0 - 36.0 g/dL   RDW 16.5 (H) 11.5 - 15.5 %   Platelets 162 150 - 400 K/uL   Neutrophils Relative % 86 %   Neutro Abs 3.3 1.7 - 7.7 K/uL   Lymphocytes Relative 10 %   Lymphs Abs 0.4 (L) 0.7 - 4.0 K/uL   Monocytes Relative 3 %   Monocytes Absolute 0.1 0.1 - 1.0 K/uL   Eosinophils Relative 1 %   Eosinophils Absolute 0.0 0.0 - 0.7 K/uL   Basophils Relative 0 %   Basophils Absolute 0.0 0.0 - 0.1 K/uL  CBC     Status: Abnormal   Collection Time: 09/02/15  7:20 AM  Result Value Ref Range   WBC 4.2 4.0 - 10.5 K/uL   RBC 3.06 (L) 3.87 - 5.11 MIL/uL   Hemoglobin 9.6 (L) 12.0 - 15.0 g/dL   HCT 31.4 (L) 36.0 - 46.0 %   MCV 102.6 (H) 78.0 - 100.0 fL   MCH 31.4 26.0 - 34.0 pg   MCHC 30.6 30.0 - 36.0 g/dL   RDW 16.6 (H) 11.5 - 15.5 %   Platelets 160 150 - 400 K/uL  Basic metabolic panel     Status: Abnormal   Collection Time: 09/02/15  7:20 AM  Result Value Ref Range   Sodium 135 135 - 145 mmol/L   Potassium 5.2 (H) 3.5 - 5.1 mmol/L   Chloride 96 (L) 101 - 111 mmol/L   CO2 30 22 - 32 mmol/L   Glucose, Bld 95 65 - 99 mg/dL   BUN 28 (H) 6 - 20 mg/dL   Creatinine, Ser 5.69 (H) 0.44 - 1.00 mg/dL   Calcium 8.6 (L) 8.9 - 10.3 mg/dL   GFR calc non Af Amer 7 (L) >60 mL/min   GFR calc Af Amer 8 (L) >60 mL/min    Comment: (NOTE) The eGFR has been calculated using the CKD EPI equation. This calculation has not been validated in all clinical situations. eGFR's persistently <60 mL/min signify possible Chronic Kidney Disease.    Anion gap 9 5 - 15  Type and screen [place]  Status: None   Collection Time: 09/02/15  7:20 AM  Result Value Ref Range   ABO/RH(D) O POS    Antibody Screen NEG    Sample Expiration 09/05/2015   CBC     Status: Abnormal   Collection Time: 09/03/15  4:44 AM  Result Value Ref Range   WBC 3.9 (L) 4.0 - 10.5 K/uL   RBC 2.80 (L) 3.87 - 5.11 MIL/uL   Hemoglobin 8.7 (L) 12.0 - 15.0 g/dL   HCT 28.5 (L) 36.0 - 46.0 %   MCV 101.8 (H) 78.0 - 100.0 fL   MCH 31.1 26.0 - 34.0 pg   MCHC 30.5 30.0 - 36.0 g/dL   RDW 16.5 (H) 11.5 - 15.5 %   Platelets 167 150 - 400 K/uL  Renal function panel     Status: Abnormal   Collection Time: 09/03/15  4:44 AM  Result Value Ref Range   Sodium 130 (L) 135 - 145 mmol/L   Potassium 6.2 (HH) 3.5 - 5.1 mmol/L    Comment: CRITICAL RESULT CALLED TO, READ BACK BY AND VERIFIED WITH: WHITEHORN,S RN 09/03/2015 0628 JORDANS    Chloride 94 (L) 101 - 111 mmol/L   CO2 29 22 - 32 mmol/L   Glucose, Bld 113 (H) 65 - 99 mg/dL   BUN 46 (H) 6 - 20 mg/dL   Creatinine, Ser 7.66 (H) 0.44 - 1.00 mg/dL   Calcium 8.9 8.9 - 10.3 mg/dL   Phosphorus 5.6 (H) 2.5 - 4.6 mg/dL   Albumin 2.6 (L) 3.5 - 5.0 g/dL   GFR calc non Af Amer 5 (L) >60 mL/min   GFR calc Af Amer 5 (L) >60 mL/min    Comment: (NOTE) The eGFR has been calculated using the CKD EPI equation. This calculation has not been validated in all clinical situations. eGFR's persistently <60 mL/min signify possible Chronic Kidney Disease.    Anion gap 7 5 - 15    ROS: as in HPI/ denies fevers, CP , SOB  GI symptoms, Dizzines  Physical Exam: Filed Vitals:   09/02/15 2022 09/03/15 0450  BP: 150/88 158/88  Pulse: 70 70  Temp: 98.1 F (36.7 C) 99.1 F (37.3 C)  Resp: 16 16     General: alert elderly AAF OX3 / NAD HEENT: [place] / MMM Neck: no jvd Heart: RRR no rub , mur ,or gallop Lungs: CTA ,nonlabored Breathing Abdomen: Soft, BS pos. NT.ND Extremities: R Leg in brace/ bilat 1+ pedal edema Skin: no overt rash/warm dry Neuro: alert OX3, moves all tremx. No acute  focal deficits appreciated  Dialysis Access: pos bruit LUA AVF  Dialysis Orders: Center[facility]   on TTS  . EDW 57.0kg HD Bath 2.0k, 2.25ca  Time 3hr 59mn Heparin NONE. Access LUAAVF     Hectorol 4 mcg IV/HD Aranesp 120   Units IV/ thursdayHD  Venofer  575mq thurs.HD  Other op labs HGB 11.2 Caq 9.6 phos 6.5 pth 529  Assessment/Plan 1. R Distal  Femur   Closed Fx = per Orthoped../Marland Kitchennoted plans for Sunday(tomor) surgery 2. ESRD -  HD TTS K 6.2 (dietary indiscretion with bananas and potatoes recently) HD today, renal diet 3. Hypertension/volume  - bp 158/88 attempt  uf 2 l / on Hydralazine 10047mnd Amlodipine 5mg31may 4. Anemia  - hgb 8.7  Aranesp ^ and weekly Fe on hd (dc po fe  Receives on HD ) 5. Metabolic bone disease -  Phos binder and Hectorol on HD 6. MMM - noted  [name] seen as Oncology and "recommendation was for palliative care and hospice but pt was not ready to accept this at that time " And recently going to [name] for sec. Opinion for RX 7. Nutrition - renal diet , renal vit / on protein supplement  Ernest Haber, PA-C [place] [phone] 09/03/2015, 1:06 PM   Pt seen, examined and agree w A/P as above. ESRD patient w fall and R distal femur fracture.  Up 3-4 kg today, cont BP meds and plan HD later today.   Kelly Splinter MD [employer] pager [phone]    cell [phone] 09/03/2015, 3:27 PM

## 2015-09-04 ENCOUNTER — Inpatient Hospital Stay: Admit: 2015-09-04 | Payer: Self-pay | Admitting: Orthopaedic Surgery

## 2015-09-04 DIAGNOSIS — T451X5A Adverse effect of antineoplastic and immunosuppressive drugs, initial encounter: Secondary | ICD-10-CM

## 2015-09-04 DIAGNOSIS — W19XXXS Unspecified fall, sequela: Secondary | ICD-10-CM

## 2015-09-04 DIAGNOSIS — D72819 Decreased white blood cell count, unspecified: Secondary | ICD-10-CM

## 2015-09-04 NOTE — Clinical Social Work Note (Signed)
Clinical Social Work Assessment  Patient Details  Name: [name] MRN: [id number] Date of Birth: 1940-11-08  Date of referral:  09/04/15               Reason for consult:  Facility Placement                Permission sought to share information with:  Family Supports Permission granted to share information::  Yes, Verbal Permission Granted  Name::     Shaleta Ruacho  Agency::     Relationship::  daughter  Contact Information:  [phone]  Housing/Transportation Living arrangements for the past 2 months:  Single Family Home Source of Information:  Patient Patient Interpreter Needed:  None Criminal Activity/Legal Involvement Pertinent to Current Situation/Hospitalization:  No - Comment as needed Significant Relationships:  Adult Children Lives with:  Self Do you feel safe going back to the place where you live?  Yes Need for family participation in patient care:  No (Coment)  Care giving concerns:  none   Facilities manager / plan:  Met with Pt at bedside and discussed d/c plans.  Pt stated that she lives alone and that she understands that going home is not a good idea, at this time.  Pt was agreeable to SNF and stated that she has been to [facility].  If they have a bed available, she'd like to return there.  Employment status:  Retired Forensic scientist:  Medicare PT Recommendations:  [place] / Referral to community resources:   (SNF list)  Patient/Family's Response to care:  Pt stated that she is in agreement with SNF.  Patient/Family's Understanding of and Emotional Response to Diagnosis, Current Treatment, and Prognosis:  Pt feels good about going to SNF.  She liked [facility] and she stated that gave her very good care.  Emotional Assessment Appearance:  Appears stated age Attitude/Demeanor/Rapport:  Apprehensive Affect (typically observed):  Apprehensive, Anxious Orientation:  Oriented to Self, Oriented to Place, Oriented to   Time, Oriented to Situation Alcohol / Substance use:  Never Used Psych involvement (Current and /or in the community):  No (Comment)  Discharge Needs  Concerns to be addressed:  Care Coordination Readmission within the last 30 days:  No Current discharge risk:  None Barriers to Discharge:  No Barriers Identified   [name], [place] 09/04/2015, 11:21 AM

## 2015-09-04 NOTE — NC FL2 (Signed)
[place] MEDICAID FL2 LEVEL OF CARE SCREENING TOOL     IDENTIFICATION  Patient Name: [name] Birthdate: 1941/08/01 Sex: female Admission Date (Current Location): 09/01/2015  [facility] and [place] Number: Herbalist and Address:  [facility] [place]. [facility], [place] [address] [place], [place] [address]      Provider Number: [id number]  Attending Physician Name and Address:  Robbie Lis, MD  Relative Name and Phone Number:       Current Level of Care: SNF Recommended Level of Care: [place] Prior Approval Number:    Date Approved/Denied:   PASRR Number: [id number]  Discharge Plan: SNF    Current Diagnoses: Patient Active Problem List   Diagnosis Date Noted  . Leukopenia due to antineoplastic chemotherapy 09/03/2015  . Hyperkalemia 09/03/2015  . Malnutrition of moderate degree 09/02/2015  . Closed fracture of right distal femur ([place]) 09/01/2015  . GERD (gastroesophageal reflux disease) 04/23/2015  . Fall 04/10/2015  . 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 ESRD (end stage renal disease) ([place]) 03/11/2014  . Multiple myeloma ([place]) 03/11/2014  . Hypertension associatd with end stage renal disease on dialysis 03/11/2014    Orientation ACTIVITIES/SOCIAL BLADDER RESPIRATION    Self, Time, Situation, Place  Active Continent Normal  BEHAVIORAL SYMPTOMS/MOOD NEUROLOGICAL BOWEL NUTRITION STATUS      Continent  (renal)  PHYSICIAN VISITS COMMUNICATION OF NEEDS Height & Weight Skin    Verbally   135 lbs. Normal          AMBULATORY STATUS RESPIRATION    Assist extensive Normal      Personal Care Assistance Level of Assistance  Bathing, Dressing Bathing Assistance: Maximum assistance   Dressing Assistance: Maximum assistance      Functional Limitations Info                SPECIAL CARE FACTORS FREQUENCY                      Additional Factors Info                  Current Medications (09/04/2015):   This is the current hospital active medication list Current Facility-Administered Medications  Medication Dose Route Frequency Provider Last Rate Last Dose  . bisacodyl (DULCOLAX) suppository 10 mg  10 mg Rectal Daily PRN Allyne Gee, MD      . cinacalcet (SENSIPAR) tablet 30 mg  30 mg Oral Q breakfast Allyne Gee, MD   30 mg at 09/04/15 0800  . [START ON 09/08/2015] Darbepoetin Alfa (ARANESP) injection 150 mcg  150 mcg Intravenous Q Thu-HD Ernest Haber, PA-C      . Derrill Memo ON 09/06/2015] doxercalciferol (HECTOROL) injection 4 mcg  4 mcg Intravenous Q T,Th,Sa-HD Ernest Haber, PA-C      . feeding supplement (BOOST / RESOURCE BREEZE) liquid 1 Container  1 Container Oral TID BM [place], RD   1 Container at 09/04/15 1048  . [START ON 09/08/2015] ferric gluconate (NULECIT) 125 mg in sodium chloride 0.9 % 100 mL IVPB  125 mg Intravenous Q Thu-HD Ernest Haber, PA-C      . furosemide (LASIX) tablet 20 mg  20 mg Oral Daily Allyne Gee, MD   20 mg at 09/04/15 1048  . heparin injection 5,000 Units  5,000 Units Subcutaneous 3 times per day Robbie Lis, MD   5,000 Units at 09/04/15 0600  . HYDROcodone-acetaminophen (NORCO/VICODIN) 5-325 MG per tablet 1-2 tablet  1-2  tablet Oral Q6H PRN Allyne Gee, MD   2 tablet at 09/03/15 1854  . isosorbide mononitrate (IMDUR) 24 hr tablet 60 mg  60 mg Oral Daily Allyne Gee, MD   60 mg at 09/04/15 1048  . magnesium citrate solution 1 Bottle  1 Bottle Oral Once PRN Allyne Gee, MD      . methocarbamol (ROBAXIN) tablet 500 mg  500 mg Oral Q6H PRN Allyne Gee, MD   500 mg at 09/03/15 1836   Or  . methocarbamol (ROBAXIN) 500 mg in dextrose 5 % 50 mL IVPB  500 mg Intravenous Q6H PRN Allyne Gee, MD      . morphine 2 MG/ML injection 0.5 mg  0.5 mg Intravenous Q2H PRN Allyne Gee, MD   0.5 mg at 09/02/15 1428  . ondansetron (ZOFRAN-ODT) disintegrating tablet 4 mg  4 mg Oral Q8H PRN Allyne Gee, MD      . polyethylene glycol (MIRALAX / GLYCOLAX)  packet 17 g  17 g Oral Daily PRN Allyne Gee, MD      . prochlorperazine (COMPAZINE) tablet 10 mg  10 mg Oral Q6H PRN Allyne Gee, MD      . valACYclovir (VALTREX) tablet 500 mg  500 mg Oral Daily Allyne Gee, MD   500 mg at 09/04/15 1048  . zolpidem (AMBIEN) tablet 5 mg  5 mg Oral QHS PRN Allyne Gee, MD         Discharge Medications: Please see discharge summary for a list of discharge medications.  Relevant Imaging Results:  Relevant Lab Results:  Recent Labs    Additional Information dialysis Tuesdays, Thursdays and Saturdays.  Infusion on Wednesdays at [facility], [place], [place]

## 2015-09-04 NOTE — Clinical Social Work Placement (Signed)
   CLINICAL SOCIAL WORK PLACEMENT  NOTE  Date:  09/04/2015  Patient Details  Name: [name] MRN: [id number] Date of Birth: 10-Nov-1940  Clinical Social Work is seeking post-discharge placement for this patient at the [place] level of care (*CSW will initial, date and re-position this form in  chart as items are completed):      Patient/family provided with [place] Work Department's list of facilities offering this level of care within the geographic area requested by the patient (or if unable, by the patient's family).  Yes   Patient/family informed of their freedom to choose among providers that offer the needed level of care, that participate in Medicare, Medicaid or managed care program needed by the patient, have an available bed and are willing to accept the patient.  Yes   Patient/family informed of [facility]'s ownership interest in [facility] and [facility], as well as of the fact that they are under no obligation to receive care at these facilities.  PASRR submitted to EDS on       PASRR number received on       Existing PASRR number confirmed on 09/04/15     FL2 transmitted to all facilities in geographic area requested by pt/family on 09/04/15     FL2 transmitted to all facilities within larger geographic area on       Patient informed that his/her managed care company has contracts with or will negotiate with certain facilities, including the following:            Patient/family informed of bed offers received.  Patient chooses bed at       Physician recommends and patient chooses bed at      Patient to be transferred to   on  .  Patient to be transferred to facility by       Patient family notified on   of transfer.  Name of family member notified:        PHYSICIAN       Additional Comment:    _______________________________________________ [name], [place] 09/04/2015, 11:26 AM

## 2015-09-04 NOTE — Progress Notes (Signed)
Pt and family arguing about whether she should have surgery or not. Pt has decided to not have the surgery. She seems to think that she will die if she has the surgery. She also believes that her femur fracture will be healed in the 21 days she is in rehab. Pt and family were shown the xray so they could understand about the type of fracture and its location. They understand that healing time will be much longer than 21 days, especially considering her other illnesses.

## 2015-09-04 NOTE — Progress Notes (Signed)
Patients daughter called and asked RN to look at surgery schedule to see if her mother is scheduled for surgery on tomorrow morning. RN looked at master schedule and did not locate patient on roster. RN informed patients daughter that the patient is not on the list for surgery on tomorrow. Patients daughter informed RN that MD called her cell phone and told her that her mother is a 100% high risk candidate for surgery. Patient/patients daughter are currently refusing surgery. Patient is tearful and upset. Patient states she is afraid that she will get into trouble because she has already signed the surgical consents and now she does not want to go proceed. Patient informed that she has rights as a patient and does not have to have the surgery if she does not want to. Nursing will continue to monitor.

## 2015-09-04 NOTE — Progress Notes (Addendum)
Patient ID: [name], female   DOB: 1941-05-07, 74 y.o.   MRN: [id number] [facility] PROGRESS NOTE  [name] [id number] DOB: 29-Jul-1941 DOA: 09/01/2015 PCP: Benito Mccreedy, MD  Brief narrative:    74 y.o. female with past medical history of ESRD on HD TTS, HTN, Multiple Myeloma (under Dr. Calton Dach care) who presented to [facility] ED status post fall and sustaining right distal femoral fracture.  Family to decide after being informed of preoperative risk if they wish to proceed with surgery. Appreciate very much ortho discussing the risk/benefit with family.    Assessment/Plan:    Principal Problem: Closed fracture of right distal femur ([place]) / Fall - Based on NSQIP surgical risk calculator pt is above average risk for serious complication, any complication, pneumonia, VTE, UTI, cardiac complications (her risk for those complications is about 33% and if we include MM her risk jumps to 15.6%), return to OR and death(2.3 - 2.7%) based on her age and comorbid conditions. Predicted length of stay after surgery may be up to 5-6 days based on surgical risk calculator. - Appreciate very much ortho discussing the risk/benefit with family. - Continue supportive care and pain management efforts   Active Problems: ESRD (end stage renal disease) ([place]) with hyperkalemia  - Appreciate renal following - HD TTS - Continue sensipar, aranesp  Hypertension associatd with end stage renal disease on dialysis - Continue lasix 20 mg daily and Imdur 60 mg daily   Leukopenia - Due to history of multiple myeloma - Stable   Multiple myeloma - Recent visit in 03/2015 with Dr. Lottie Rater at which time recommendation was for palliative care and hospice but pt was not ready to accept this at that time - Per [name] in 03/2015 "I would not recommend any form of further palliative chemotherapy given poor response rates to further palliative treatment and potential significant risk of side effects. I  recommend palliative care and hospice but the patient cannot accept the concept of not going for further treatment. She cannot accept the fact that some of the side effects of treatment could potentially be life-threatening and can shorten her survivor without bringing any additional benefit. After very prolonged discussion, ultimately she wants to be referred to [facility] for second opinion."  Anemia of chronic disease - Likely combination of ERDS and multiple myeloma - Hemoglobin 8.7 - Recheck CBC in am - Continue aranesp and  Ferric gluconate   GERD (gastroesophageal reflux disease) - Continue Protonix   Malnutrition of moderate degree - In the context of chronic illness   - Seen by dietician   DVT Prophylaxis  - SCD's bilaterally in hospital   Code Status: Full.  Family Communication:  Family not at the bedside this am  Disposition Plan: Will see if pt and family want to proceed with surgery, anticipate discharge once we know what family prefers at this point.   IV access:  Peripheral IV  Procedures and diagnostic studies:    Dg Chest 2 View 09/03/2015 Stable exam.  No evidence of active cardiopulmonary disease. Electronically Signed   By: Monte Fantasia M.D.   On: 09/03/2015 08:58   Ct Knee Right Wo Contrast 09/02/2015  Severely comminuted complex distal femoral fracture. Mild lateral subluxation of the lateral femoral condyle fragment respect to the lateral tibial plateau. Electronically Signed   By: Andreas Newport M.D.   On: 09/02/2015 00:02   Dg Knee Complete 4 Views Right 09/01/2015  1. Severely comminuted fracture of the distal femoral metaphysis  with a vertically oriented fracture cleft involving the lateral femoral condyle. Electronically Signed   By: Kathreen Devoid   On: 09/01/2015 22:49   Dg Hip Unilat With Pelvis 2-3 Views Right 09/01/2015 No fracture or dislocation. Electronically Signed   By: Anner Crete M.D.   On: 09/01/2015 22:52   Medical Consultants:   Orthopedic surgery  Nephrology   Other Consultants:  PT  Nutrition  IAnti-Infectives:   None    Leisa Lenz, MD  [facility] Pager [phone]  Time spent in minutes: 25 minutes  If 7PM-7AM, please contact night-coverage [email or web address] Password [email or web address] 09/04/2015, 12:11 PM   LOS: 2 days    HPI/Subjective: No acute overnight events. No respiratory distress. No nausea or vomiting.   Objective: Filed Vitals:   09/03/15 2230 09/03/15 2305 09/04/15 0011 09/04/15 0503  BP: 111/72 108/74 126/66 120/70  Pulse: 80 82 90 79  Temp:   98.5 F (36.9 C) 99.5 F (37.5 C)  TempSrc:   Oral Oral  Resp: '16 16 18 18  ' Weight:  61.6 kg (135 lb 12.9 oz)    SpO2:   99% 98%    Intake/Output Summary (Last 24 hours) at 09/04/15 1211 Last data filed at 09/03/15 2305  Gross per 24 hour  Intake      0 ml  Output   1000 ml  Net  -1000 ml    Exam:   General:  Pt is not in acute distress  Cardiovascular: Regular rate and rhythm, S1/S2(+)  Respiratory: No wheezing, no crackles, no rhonchi  Abdomen: (+) BS, non tender  Extremities: trace pedal edema, pulses palpable bilaterally  Neuro: Nonfocal  Data Reviewed: Basic Metabolic Panel:  Recent Labs Lab 09/01/15 2312 09/02/15 0720 09/03/15 0444 09/03/15 2154  NA 135 135 130* 130*  K 4.4 5.2* 6.2* 4.1  CL 95* 96* 94* 91*  CO2 34* '30 29 29  ' GLUCOSE 138* 95 113* 90  BUN 25* 28* 46* 30*  CREATININE 5.23* 5.69* 7.66* 5.15*  CALCIUM 9.1 8.6* 8.9 8.6*  PHOS  --   --  5.6*  --    Liver Function Tests:  Recent Labs Lab 09/03/15 0444  ALBUMIN 2.6*   No results for input(s): LIPASE, AMYLASE in the last 168 hours. No results for input(s): AMMONIA in the last 168 hours. CBC:  Recent Labs Lab 09/01/15 2312 09/02/15 0720 09/03/15 0444  WBC 3.8* 4.2 3.9*  NEUTROABS 3.3  --   --   HGB 10.7* 9.6* 8.7*  HCT 34.8* 31.4* 28.5*  MCV 103.0* 102.6* 101.8*  PLT 162 160 167   Cardiac Enzymes: No results for input(s):  CKTOTAL, CKMB, CKMBINDEX, TROPONINI in the last 168 hours. BNP: Invalid input(s): POCBNP CBG: No results for input(s): GLUCAP in the last 168 hours.  No results found for this or any previous visit (from the past 240 hour(s)).   Scheduled Meds: . cinacalcet  30 mg Oral Q breakfast  . [START ON 09/08/2015] darbepoetin (ARANESP) injection - DIALYSIS  150 mcg Intravenous Q Thu-HD  . [START ON 09/06/2015] doxercalciferol  4 mcg Intravenous Q T,Th,Sa-HD  . feeding supplement  1 Container Oral TID BM  . [START ON 09/08/2015] ferric gluconate (FERRLECIT/NULECIT) IV  125 mg Intravenous Q Thu-HD  . furosemide  20 mg Oral Daily  . heparin  5,000 Units Subcutaneous 3 times per day  . isosorbide mononitrate  60 mg Oral Daily  . valACYclovir  500 mg Oral Daily   Continuous Infusions:

## 2015-09-04 NOTE — Progress Notes (Signed)
  [name] Progress Note   Subjective: hypotensive on HD yest and only got off 1 kg  Filed Vitals:   09/03/15 2230 09/03/15 2305 09/04/15 0011 09/04/15 0503  BP: 111/72 108/74 126/66 120/70  Pulse: 80 82 90 79  Temp:   98.5 F (36.9 C) 99.5 F (37.5 C)  TempSrc:   Oral Oral  Resp: 16 16 18 18   Weight:  61.6 kg (135 lb 12.9 oz)    SpO2:   99% 98%    Inpatient medications: . amLODipine  5 mg Oral Daily  . cinacalcet  30 mg Oral Q breakfast  . [START ON 09/08/2015] darbepoetin (ARANESP) injection - DIALYSIS  150 mcg Intravenous Q Thu-HD  . [START ON 09/06/2015] doxercalciferol  4 mcg Intravenous Q T,Th,Sa-HD  . feeding supplement  1 Container Oral TID BM  . [START ON 09/08/2015] ferric gluconate (FERRLECIT/NULECIT) IV  125 mg Intravenous Q Thu-HD  . furosemide  20 mg Oral Daily  . heparin  5,000 Units Subcutaneous 3 times per day  . hydrALAZINE  100 mg Oral TID  . isosorbide mononitrate  60 mg Oral Daily  . valACYclovir  500 mg Oral Daily     bisacodyl, HYDROcodone-acetaminophen, magnesium citrate, methocarbamol **OR** methocarbamol (ROBAXIN)  IV, morphine injection, ondansetron, polyethylene glycol, prochlorperazine, zolpidem  Exam: Elderly female, no distress, ox 3 No jvd Chest clear bilat RRR no MRG Abd soft ntnd no mass or ascites Ext R leg in brace 1+ bilat pretib edema Neuro nf ox 3 LUA AVF +bruit  AF TTS  3h 25min  2/2.25 bath  57kg  Hep none  LUA AVF Hect 4 ug ARanesp 120/ thurs Venofer 50 / thurs Labs Hb 11  Ca 11.2 Phos 6.5 pth 529      Assessment: 1 R femur fracture- poss surgery this week, family discussing 2 ESRD 3 HTN amlod/ hydral, bp's soft 4 Anemia esa Fe 5 MBD cont meds 6 Mult myeloma failed numerous attempts at chemo 7 Nutrition mvi/ supp 8 Vol up 4-5 kg   Plan - DC bp meds for now, let bp come up. Next HD Tuesday.    Kelly Splinter MD [facility] pager [phone]    cell [phone] 09/04/2015, 9:22 AM     Recent Labs Lab 09/02/15 0720 09/03/15 0444 09/03/15 2154  NA 135 130* 130*  K 5.2* 6.2* 4.1  CL 96* 94* 91*  CO2 30 29 29   GLUCOSE 95 113* 90  BUN 28* 46* 30*  CREATININE 5.69* 7.66* 5.15*  CALCIUM 8.6* 8.9 8.6*  PHOS  --  5.6*  --     Recent Labs Lab 09/03/15 0444  ALBUMIN 2.6*    Recent Labs Lab 09/01/15 2312 09/02/15 0720 09/03/15 0444  WBC 3.8* 4.2 3.9*  NEUTROABS 3.3  --   --   HGB 10.7* 9.6* 8.7*  HCT 34.8* 31.4* 28.5*  MCV 103.0* 102.6* 101.8*  PLT 162 160 167

## 2015-09-05 DIAGNOSIS — N186 End stage renal disease: Secondary | ICD-10-CM

## 2015-09-05 DIAGNOSIS — I120 Hypertensive chronic kidney disease with stage 5 chronic kidney disease or end stage renal disease: Secondary | ICD-10-CM

## 2015-09-05 DIAGNOSIS — S72491S Other fracture of lower end of right femur, sequela: Secondary | ICD-10-CM

## 2015-09-05 DIAGNOSIS — Z992 Dependence on renal dialysis: Secondary | ICD-10-CM

## 2015-09-05 DIAGNOSIS — D630 Anemia in neoplastic disease: Secondary | ICD-10-CM

## 2015-09-05 NOTE — Progress Notes (Signed)
Subjective:  No cos/ for hd tues.  Objective Vital signs in last 24 hours: Filed Vitals:   09/04/15 2142 09/04/15 2300 09/05/15 0358 09/05/15 0609  BP: 99/60   135/61  Pulse: 56   62  Temp: 100.6 F (38.1 C) 98.9 F (37.2 C)  98.9 F (37.2 C)  TempSrc: Oral Oral  Oral  Resp: 18  16 16   Weight:      SpO2: 100%   100%   Weight change:   Physical Exam: General: alert talkative AAf , NAD ,  Heart: RRR no rug, mur , gallop Lungs: CTA  Abdomen: soft , NT, ND Extremities: R  Lower leg brace/ bilat trace pedal edema  Dialysis Access: pos bruit LUA AVF    OP HD= AF TTS 3h 85min 2/2.25 bath 57kg Hep none LUA AVF Hect 4 ug ARanesp 120/ thurs Venofer 50 / thurs Labs Hb 11 Ca 11.2 Phos 6.5 pth 529   Problem/Plan: 1 R femur fracture= ?poss surgery this week, family discussing/ ?but pt refusing  OR today 2 ESRD= HD TTS ([facility] ) need to do HD in [place] as at OP hd center in am 3 HTN/ vol  =amlod/ hydral have been  dc with  bp's soft/vol wise  BED wts not totally accurate  with wt post hd sat 4kg>edw 4 Anemia = hgb 10.7 >8.7 esa( 150 with hd thurs 12/08  Give tues instead with dropping HGB / Fe on hd weekly 5 MBD =cont meds 6 Mult myeloma =failed numerous attempts at chemo=unc onoc RX now 7 Nutrition mvi/ supp    Ernest Haber, PA-C [facility] Beeper [phone] 09/05/2015,10:16 AM  LOS: 3 days   Labs: Basic Metabolic Panel:  Recent Labs Lab 09/02/15 0720 09/03/15 0444 09/03/15 2154  NA 135 130* 130*  K 5.2* 6.2* 4.1  CL 96* 94* 91*  CO2 30 29 29   GLUCOSE 95 113* 90  BUN 28* 46* 30*  CREATININE 5.69* 7.66* 5.15*  CALCIUM 8.6* 8.9 8.6*  PHOS  --  5.6*  --    Liver Function Tests:  Recent Labs Lab 09/03/15 0444  ALBUMIN 2.6*   No results for input(s): LIPASE, AMYLASE in the last 168 hours. No results for input(s): AMMONIA in the last 168 hours. CBC:  Recent Labs Lab 09/01/15 2312 09/02/15 0720 09/03/15 0444  WBC 3.8* 4.2 3.9*   NEUTROABS 3.3  --   --   HGB 10.7* 9.6* 8.7*  HCT 34.8* 31.4* 28.5*  MCV 103.0* 102.6* 101.8*  PLT 162 160 167   Cardiac Enzymes: No results for input(s): CKTOTAL, CKMB, CKMBINDEX, TROPONINI in the last 168 hours. CBG: No results for input(s): GLUCAP in the last 168 hours.  Studies/Results: Dg Knee 1-2 Views Left  09/03/2015  CLINICAL DATA:  Preoperative planning for ORIF right distal femur fracture. EXAM: LEFT KNEE - 1-2 VIEW COMPARISON:  None. FINDINGS: No fracture, joint effusion, dislocation or suspicious focal osseous lesion. Mild tricompartmental osteoarthritis in the left knee joint. Small to moderate superior and small inferior left patellar enthesophytes. Vascular calcifications throughout the posterior soft tissues. IMPRESSION: Mild tricompartmental osteoarthritis in the left knee joint. Electronically Signed   By: Ilona Sorrel M.D.   On: 09/03/2015 15:20   Dg Femur 1v Left  09/03/2015  CLINICAL DATA:  Preoperative planning for ORIF right distal femur fracture. EXAM: LEFT FEMUR 1 VIEW COMPARISON:  None. FINDINGS: No fracture or suspicious focal osseous lesion in the left femur. No evidence of malalignment at the  left hip or left knee. Mild osteoarthritis in the medial and lateral compartments of the left knee joint. Vascular calcifications throughout the soft tissues. Left femur measures 55.5 cm using the provided rule or, measuring from the superior margin of the left femoral head to the inferior margin of the medial left femoral condyle. IMPRESSION: No left femur fracture.  Left femur measurements as described. Mild osteoarthritis in the medial and lateral compartments of the left knee joint. Electronically Signed   By: Ilona Sorrel M.D.   On: 09/03/2015 15:55   Dg Femur, Min 2 Views Right  09/03/2015  CLINICAL DATA:  Preoperative planning for ORIF right distal femur fracture EXAM: RIGHT FEMUR 2 VIEWS COMPARISON:  Right knee radiographs from 09/01/2015. FINDINGS: Re- demonstrated is  a complex comminuted intra-articular fracture of the distal right femur, with stable 2.8 cm lateral displacement of the lateral condyle fracture fragment and stable impaction of the fracture fragments. No additional fracture. No suspicious focal osseous lesion. No right hip dislocation. No right knee dislocation. Large lipohemarthrosis in the suprapatellar right knee joint. Underlying osteoarthritis in the lateral and patellofemoral compartments of the right knee joint. Vascular calcifications throughout the soft tissues. IMPRESSION: Re- demonstration of complex comminuted displaced intra-articular right distal femur fracture with large lipohemarthrosis in the suprapatellar right knee joint. Electronically Signed   By: Ilona Sorrel M.D.   On: 09/03/2015 15:19   Medications:   . cinacalcet  30 mg Oral Q breakfast  . [START ON 09/08/2015] darbepoetin (ARANESP) injection - DIALYSIS  150 mcg Intravenous Q Thu-HD  . [START ON 09/06/2015] doxercalciferol  4 mcg Intravenous Q T,Th,Sa-HD  . feeding supplement  1 Container Oral TID BM  . [START ON 09/08/2015] ferric gluconate (FERRLECIT/NULECIT) IV  125 mg Intravenous Q Thu-HD  . furosemide  20 mg Oral Daily  . heparin  5,000 Units Subcutaneous 3 times per day  . isosorbide mononitrate  60 mg Oral Daily  . valACYclovir  500 mg Oral Daily

## 2015-09-05 NOTE — Care Management Important Message (Signed)
Important Message  Patient Details  Name: [name] MRN: [id number] Date of Birth: [birth year]   Medicare Important Message Given:  Yes    [name] P [place] 09/05/2015, 12:01 PM

## 2015-09-05 NOTE — Evaluation (Signed)
Occupational Therapy Evaluation Patient Details Name: [name] MRN: [id number] DOB: 05/13/1941 Today's Date: 09/05/2015    History of Present Illness 74 y.o. female with past medical history of ESRD on HD TTS, HTN, Multiple Myeloma (under Dr. Calton Dach care) who presented to [facility] ED status post fall and sustaining right distal femoral fracture.  Have opted to not have surgical repair of femur at this time.    Clinical Impression   Pt currently +2 mod assist for transfer to bedside chair while maintaining NWBing status on the RLE.  Will need +2 as well for LB selfcare sit to stand.  Recommend acute care OT for progressing ADL independence but will need SNF for follow-up therapy.      Follow Up Recommendations  SNF;Supervision/Assistance - 24 hour    Equipment Recommendations  Other (comment) (TBD next venue of care)       Precautions / Restrictions Precautions Precautions: Fall Precaution Comments: fall Required Braces or Orthoses: Knee Immobilizer - Right (No order noted for KI but pt wearing at this time.) Knee Immobilizer - Right: On at all times Restrictions Weight Bearing Restrictions: Yes RLE Weight Bearing: Non weight bearing      Mobility Bed Mobility Overal bed mobility: Needs Assistance Bed Mobility: Supine to Sit     Supine to sit: Max assist     General bed mobility comments: Pt able to bring trunk up from elevated bed and assist some with scooting once therapist helped advance her LEs over the edge of the bed and helped turn her hips.   Transfers Overall transfer level: Needs assistance Equipment used: 1 person hand held assist Transfers: Sit to/from Stand Sit to Stand: +2 physical assistance;Total assist         General transfer comment: Total assist +2 for partial sit to stand in order to maintain NWBing over the RLE.     Balance Overall balance assessment: Needs assistance   Sitting balance-Leahy Scale: Fair       Standing balance-Leahy  Scale: Zero Standing balance comment: Pt needs constant assist for maintaining partial standing.                            ADL Overall ADL's : Needs assistance/impaired Eating/Feeding: Independent;Sitting   Grooming: Wash/dry hands;Wash/dry face;Set up;Sitting   Upper Body Bathing: Set up;Sitting   Lower Body Bathing: +2 for physical assistance;Moderate assistance;Sit to/from stand   Upper Body Dressing : Minimal assistance;Sitting   Lower Body Dressing: +2 for physical assistance;Maximal assistance   Toilet Transfer: Maximal assistance;Squat-pivot Toilet Transfer Details (indicate cue type and reason): simulated Toileting- Clothing Manipulation and Hygiene: +2 for physical assistance;Sit to/from stand;Maximal assistance         General ADL Comments: Attempted sit to stand with RW with pt requring total assist for partial stand and therapist placing her RLE on his foot to maintain [place] status.  During attempted standing pt stopped and began pivoting to the bedside chair instead of standing up straight.       Vision Vision Assessment?: No apparent visual deficits   Perception Perception Perception Tested?: No   Praxis Praxis Praxis tested?: Not tested    Pertinent Vitals/Pain Pain Assessment: Faces Faces Pain Scale: Hurts little more Pain Location: right LE at the knee Pain Descriptors / Indicators: Discomfort;Grimacing Pain Intervention(s): Limited activity within patient's tolerance;Repositioned     Hand Dominance Right   Extremity/Trunk Assessment Upper Extremity Assessment Upper Extremity Assessment: Overall WFL for tasks assessed  Lower Extremity Assessment Lower Extremity Assessment: Defer to PT evaluation   Cervical / Trunk Assessment Cervical / Trunk Assessment: Normal   Communication Communication Communication: No difficulties   Cognition Arousal/Alertness: Awake/alert Behavior During Therapy: Anxious Overall Cognitive Status: No  family/caregiver present to determine baseline cognitive functioning       Memory:  (Pt with decreased understanding of directions to stand with RW first before attempting to transfer to the bedside chair. )                        Home Living Family/patient expects to be discharged to:: Skilled nursing facility                                        Prior Functioning/Environment Level of Independence: Independent with assistive device(s)             OT Diagnosis: Generalized weakness;Acute pain   OT Problem List: Decreased strength;Impaired balance (sitting and/or standing);Pain;Decreased knowledge of use of DME or AE;Decreased cognition   OT Treatment/Interventions: Self-care/ADL training;DME and/or AE instruction;Patient/family education;Balance training;Therapeutic activities;Neuromuscular education    OT Goals(Current goals can be found in the care plan section) Acute Rehab OT Goals Patient Stated Goal: Pt thankful to get OOB but did not state specific goals this session.  OT Goal Formulation: With patient Time For Goal Achievement: 09/19/15 Potential to Achieve Goals: Good  OT Frequency: Min 2X/week   Barriers to D/C: Decreased caregiver support             End of Session Equipment Utilized During Treatment: Gait belt Nurse Communication: Mobility status  Activity Tolerance: Patient limited by pain Patient left: in chair;with call bell/phone within reach   Time: 1423-1450 OT Time Calculation (min): 27 min Charges:  OT General Charges $OT Visit: 1 Procedure OT Evaluation $Initial OT Evaluation Tier I: 1 Procedure OT Treatments $Self Care/Home Management : 8-22 mins  Dawsyn Zurn OTR/L 09/05/2015, 3:19 PM

## 2015-09-05 NOTE — Progress Notes (Addendum)
Patient ID: [name], female   DOB: 06-04-1941, 74 y.o.   MRN: [id number] [facility] PROGRESS NOTE  Ashlye Oviedo SHF:[id number] DOB: [birth year] DOA: 09/01/2015 PCP: Benito Mccreedy, MD  Brief narrative:    74 y.o. female with past medical history of ESRD on HD TTS, HTN, Multiple Myeloma (under Dr. Calton Dach care) who presented to [facility] ED status post fall and sustaining right distal femoral fracture.  Family to decide after being informed of preoperative risk if they wish to proceed with surgery. Appreciate very much ortho discussing the risk/benefit with family.    Assessment/Plan:    Closed fracture of right distal femur ([place]) / Fall - Based on NSQIP surgical risk calculator pt is above average risk for serious complication, any complication, pneumonia, VTE, UTI, cardiac complications (her risk for those complications is about 50% and if we include MM her risk jumps to 15.6%), return to OR and death(2.3 - 2.7%) based on her age and comorbid conditions. Predicted length of stay after surgery may be up to 5-6 days based on surgical risk calculator. - Appreciate very much ortho discussing the risk/benefit with family. - Continue supportive care and pain management efforts  -patient currently refusing surgery- planning to speak with Dr. Erlinda Hong today -? Brace  ESRD (end stage renal disease) ([place]) with hyperkalemia  - Appreciate renal following-- will need chair - HD TTS - Continue sensipar, aranesp  Hypertension associatd with end stage renal disease on dialysis - Continue lasix 20 mg daily and Imdur 60 mg daily   Leukopenia - Due to history of multiple myeloma - Stable   Multiple myeloma - Recent visit in 03/2015 with Dr. Lottie Rater at which time recommendation was for palliative care and hospice but pt was not ready to accept this at that time - Per [name] in 03/2015 "I would not recommend any form of further palliative chemotherapy given poor response rates to further  palliative treatment and potential significant risk of side effects. I recommend palliative care and hospice but the patient cannot accept the concept of not going for further treatment. She cannot accept the fact that some of the side effects of treatment could potentially be life-threatening and can shorten her survivor without bringing any additional benefit. After very prolonged discussion, ultimately she wants to be referred to [facility] for second opinion."-- getting chemo at [facility]- Dr. 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  Family Communication:  Family not at the bedside this am  Disposition Plan: SNF-- gets "big wheel transportation" to chemo appointment    IV access:  Peripheral IV  Procedures and diagnostic studies:    Dg Chest 2 View 09/03/2015 Stable exam.  No evidence of active cardiopulmonary disease. Electronically Signed   By: Monte Fantasia M.D.   On: 09/03/2015 08:58   Ct Knee Right Wo Contrast 09/02/2015  Severely comminuted complex distal femoral fracture. Mild lateral subluxation of the lateral femoral condyle fragment respect to the lateral tibial plateau. Electronically Signed   By: Andreas Newport M.D.   On: 09/02/2015 00:02   Dg Knee Complete 4 Views Right 09/01/2015  1. Severely comminuted fracture of the distal  femoral metaphysis with a vertically oriented fracture cleft involving the lateral femoral condyle. Electronically Signed   By: Kathreen Devoid   On: 09/01/2015 22:49   Dg Hip Unilat With Pelvis 2-3 Views Right 09/01/2015 No fracture or dislocation. Electronically Signed   By: Anner Crete M.D.   On: 09/01/2015  22:52   Medical Consultants:  Orthopedic surgery  Nephrology   Other Consultants:  PT  Nutrition  IAnti-Infectives:   None    Shalinda Burkholder UPCHURCH Shariece Viveiros, DO  [facility] Pager [phone]  Time spent in minutes: 25 minutes  If 7PM-7AM, please contact night-coverage [email or web address] Password [email or web address] 09/05/2015, 10:33 AM   LOS: 3 days    HPI/Subjective: No acute overnight events. No respiratory distress. No nausea or vomiting.   Objective: Filed Vitals:   09/04/15 2142 09/04/15 2300 09/05/15 0358 09/05/15 0609  BP: 99/60   135/61  Pulse: 56   62  Temp: 100.6 F (38.1 C) 98.9 F (37.2 C)  98.9 F (37.2 C)  TempSrc: Oral Oral  Oral  Resp: _0 Weight:      SpO2: 100%   100%    Intake/Output Summary (Last 24 hours) at 09/05/15 1033 Last data filed at 09/04/15 2300  Gross per 24 hour  Intake    580 ml  Output      0 ml  Net    580 ml    Exam:   General:  Pt is not in acute distress  Cardiovascular: Regular rate and rhythm, S1/S2(+)  Respiratory: No wheezing, no crackles, no rhonchi  Abdomen: (+) BS, non tender  Extremities: trace pedal edema, pulses palpable bilaterally  Neuro: Nonfocal  Data Reviewed: Basic Metabolic Panel:  Recent Labs Lab 09/01/15 2312 09/02/15 0720 09/03/15 0444 09/03/15 2154  NA 135 135 130* 130*  K 4.4 5.2* 6.2* 4.1  CL 95* 96* 94* 91*  CO2 34* _1 GLUCOSE 138* 95 113* 90  BUN 25* 28* 46* 30*  CREATININE 5.23* 5.69* 7.66* 5.15*  CALCIUM 9.1 8.6* 8.9 8.6*  PHOS  --   --  5.6*  --    Liver Function Tests:  Recent Labs Lab 09/03/15 0444  ALBUMIN 2.6*   No results for input(s): LIPASE, AMYLASE in the last 168 hours. No results for input(s): AMMONIA in the last 168 hours. CBC:  Recent Labs Lab 09/01/15 2312 09/02/15 0720 09/03/15 0444  WBC 3.8* 4.2 3.9*  NEUTROABS 3.3  --   --   HGB 10.7* 9.6* 8.7*  HCT 34.8* 31.4* 28.5*  MCV 103.0* 102.6* 101.8*  PLT 162 160 167   Cardiac Enzymes: No  results for input(s): CKTOTAL, CKMB, CKMBINDEX, TROPONINI in the last 168 hours. BNP: Invalid input(s): POCBNP CBG: No results for input(s): GLUCAP in the last 168 hours.  No results found for this or any previous visit (from the past 240 hour(s)).   Scheduled Meds: . cinacalcet  30 mg Oral Q breakfast  . [START ON 09/08/2015] darbepoetin (ARANESP) injection - DIALYSIS  150 mcg Intravenous Q Thu-HD  . [START ON 09/06/2015] doxercalciferol  4 mcg Intravenous Q T,Th,Sa-HD  . feeding supplement  1 Container Oral TID BM  . [START ON 09/08/2015] ferric gluconate (FERRLECIT/NULECIT) IV  125 mg Intravenous Q Thu-HD  . furosemide  20 mg Oral Daily  . heparin  5,000 Units Subcutaneous 3 times per day  . isosorbide mononitrate  60 mg Oral Daily  . valACYclovir  500 mg Oral Daily   Continuous Infusions:

## 2015-09-05 NOTE — Progress Notes (Deleted)
OT Cancellation Note  Patient Details Name: [name] MRN: [id number] DOB: Feb 25, 1941   Cancelled Treatment:    Reason Eval/Treat Not Completed: Medical issues which prohibited therapy  Family/pt still deciding on surgery will hold eval for now.  MD please advise if it is OK to proceed with OT if final decision is to not have surgery.    Celenia Hruska OTR/L  09/05/2015, 2:19 PM

## 2015-09-06 DIAGNOSIS — C9000 Multiple myeloma not having achieved remission: Secondary | ICD-10-CM

## 2015-09-06 LAB — CBC WITH DIFFERENTIAL/PLATELET
Basophils Absolute: 0 10*3/uL (ref 0.0–0.1)
Basophils Relative: 1 %
Eosinophils Absolute: 0.1 10*3/uL (ref 0.0–0.7)
Eosinophils Relative: 3 %
HEMATOCRIT: 22.3 % — AB (ref 36.0–46.0)
HEMOGLOBIN: 7.1 g/dL — AB (ref 12.0–15.0)
LYMPHS ABS: 0.3 10*3/uL — AB (ref 0.7–4.0)
Lymphocytes Relative: 16 %
MCH: 31.1 pg (ref 26.0–34.0)
MCHC: 31.8 g/dL (ref 30.0–36.0)
MCV: 97.8 fL (ref 78.0–100.0)
MONOS PCT: 9 %
Monocytes Absolute: 0.2 10*3/uL (ref 0.1–1.0)
NEUTROS ABS: 1.5 10*3/uL — AB (ref 1.7–7.7)
NEUTROS PCT: 71 %
Platelets: 164 10*3/uL (ref 150–400)
RBC: 2.28 MIL/uL — ABNORMAL LOW (ref 3.87–5.11)
RDW: 15.3 % (ref 11.5–15.5)
WBC: 2.1 10*3/uL — ABNORMAL LOW (ref 4.0–10.5)

## 2015-09-06 LAB — PREPARE RBC (CROSSMATCH)

## 2015-09-06 LAB — BASIC METABOLIC PANEL
ANION GAP: 10 (ref 5–15)
BUN: 59 mg/dL — ABNORMAL HIGH (ref 6–20)
CHLORIDE: 88 mmol/L — AB (ref 101–111)
CO2: 25 mmol/L (ref 22–32)
Calcium: 7.9 mg/dL — ABNORMAL LOW (ref 8.9–10.3)
Creatinine, Ser: 8.62 mg/dL — ABNORMAL HIGH (ref 0.44–1.00)
GFR calc non Af Amer: 4 mL/min — ABNORMAL LOW (ref >60)
GFR, EST AFRICAN AMERICAN: 5 mL/min — AB (ref >60)
Glucose, Bld: 118 mg/dL — ABNORMAL HIGH (ref 65–99)
POTASSIUM: 5.6 mmol/L — AB (ref 3.5–5.1)
Sodium: 123 mmol/L — ABNORMAL LOW (ref 135–145)

## 2015-09-06 MED ORDER — SODIUM CHLORIDE 0.9 % IV SOLN: Freq: Once | INTRAVENOUS | Status: DC

## 2015-09-06 MED ORDER — DOXERCALCIFEROL 4 MCG/2ML IV SOLN
INTRAVENOUS | Status: AC
  Administered 2015-09-06: 4 ug via INTRAVENOUS
  Filled 2015-09-06: qty 2

## 2015-09-06 NOTE — Progress Notes (Signed)
[facility] ROUNDING NOTE   Subjective:   Interval History:  Comfortable on dialysis s/p femur fracture  Objective:  Vital signs in last 24 hours:  Temp:  [98 F (36.7 C)-99.5 F (37.5 C)] 98 F (36.7 C) (12/06 0905) Pulse Rate:  [58-72] 58 (12/06 0951) Resp:  [16] 16 (12/06 0905) BP: (110-148)/(73-89) 131/83 mmHg (12/06 0951) SpO2:  [96 %-100 %] 100 % (12/06 0552) Weight:  [64 kg (141 lb 1.5 oz)] 64 kg (141 lb 1.5 oz) (12/06 0905)  Weight change:  Filed Weights   09/03/15 1915 09/03/15 2305 09/06/15 0905  Weight: 62.5 kg (137 lb 12.6 oz) 61.6 kg (135 lb 12.9 oz) 64 kg (141 lb 1.5 oz)    Intake/Output: I/O last 3 completed shifts: In: 700 [P.O.:700] Out: -    Intake/Output this shift:     General: alert talkative AAf , NAD ,  Heart: RRR no rug, mur , gallop Lungs: CTA  Abdomen: soft , NT, ND Extremities: R Lower leg brace/ bilat trace pedal edema  Dialysis Access: pos bruit LUA AVF    Basic Metabolic Panel:  Recent Labs Lab 09/01/15 2312 09/02/15 0720 09/03/15 0444 09/03/15 2154  NA 135 135 130* 130*  K 4.4 5.2* 6.2* 4.1  CL 95* 96* 94* 91*  CO2 34* 30 29 29   GLUCOSE 138* 95 113* 90  BUN 25* 28* 46* 30*  CREATININE 5.23* 5.69* 7.66* 5.15*  CALCIUM 9.1 8.6* 8.9 8.6*  PHOS  --   --  5.6*  --     Liver Function Tests:  Recent Labs Lab 09/03/15 0444  ALBUMIN 2.6*   No results for input(s): LIPASE, AMYLASE in the last 168 hours. No results for input(s): AMMONIA in the last 168 hours.  CBC:  Recent Labs Lab 09/01/15 2312 09/02/15 0720 09/03/15 0444 09/06/15 0915  WBC 3.8* 4.2 3.9* 2.1*  NEUTROABS 3.3  --   --  1.5*  HGB 10.7* 9.6* 8.7* 7.1*  HCT 34.8* 31.4* 28.5* 22.3*  MCV 103.0* 102.6* 101.8* 97.8  PLT 162 160 167 164    Cardiac Enzymes: No results for input(s): CKTOTAL, CKMB, CKMBINDEX, TROPONINI in the last 168 hours.  BNP: Invalid input(s): POCBNP  CBG: No results for input(s): GLUCAP in the last 168  hours.  Microbiology: Results for orders placed or performed during the hospital encounter of 04/10/15  Urine culture     Status: None   Collection Time: 04/10/15  6:00 PM  Result Value Ref Range Status   Specimen Description URINE, CLEAN CATCH  Final   Special Requests NONE  Final   Culture   Final    MULTIPLE SPECIES PRESENT, SUGGEST RECOLLECTION IF CLINICALLY INDICATED   Report Status 04/11/2015 FINAL  Final  MRSA PCR Screening     Status: None   Collection Time: 04/11/15  2:11 AM  Result Value Ref Range Status   MRSA by PCR NEGATIVE NEGATIVE Final    Comment:        The GeneXpert MRSA Assay (FDA approved for NASAL specimens only), is one component of a comprehensive MRSA colonization surveillance program. It is not intended to diagnose MRSA infection nor to guide or monitor treatment for MRSA infections.     Coagulation Studies: No results for input(s): LABPROT, INR in the last 72 hours.  Urinalysis: No results for input(s): COLORURINE, LABSPEC, PHURINE, GLUCOSEU, HGBUR, BILIRUBINUR, KETONESUR, PROTEINUR, UROBILINOGEN, NITRITE, LEUKOCYTESUR in the last 72 hours.  Invalid input(s): APPERANCEUR    Imaging: No results found.  Medications:     . cinacalcet  30 mg Oral Q breakfast  . [START ON 09/08/2015] darbepoetin (ARANESP) injection - DIALYSIS  150 mcg Intravenous Q Thu-HD  . doxercalciferol  4 mcg Intravenous Q T,Th,Sa-HD  . feeding supplement  1 Container Oral TID BM  . [START ON 09/08/2015] ferric gluconate (FERRLECIT/NULECIT) IV  125 mg Intravenous Q Thu-HD  . furosemide  20 mg Oral Daily  . heparin  5,000 Units Subcutaneous 3 times per day  . isosorbide mononitrate  60 mg Oral Daily  . valACYclovir  500 mg Oral Daily   bisacodyl, HYDROcodone-acetaminophen, magnesium citrate, methocarbamol **OR** methocarbamol (ROBAXIN)  IV, morphine injection, ondansetron, polyethylene glycol, prochlorperazine, zolpidem  Assessment/ Plan:  OP HD= AF TTS 3h 58min  2/2.25 bath 57kg Hep none LUA AVF Hect 4 ug ARanesp 120/ thurs Venofer 50 / thurs  1 R femur fracture=  Refused surgery 2 ESRD= HD TTS ([facility] ) could not dialyze in recliner 3 HTN/ vol blood pressure controlled 4 Anemia = hgb dropped to 7.1 dabepoietin 162mcg   Will transfuse 2 units PRBCs  5 MBD = Calcium 8.6  6 Mult myeloma =failed numerous attempts at chemo=[facility] now 7 Nutrition mvi/ supp    LOS: 4 Lawanna Cecere W @TODAY @9 :59 AM

## 2015-09-06 NOTE — Evaluation (Signed)
Physical Therapy Evaluation Patient Details Name: [name] MRN: [id number] DOB: 1941-05-29 Today's Date: 09/06/2015   History of Present Illness  74 y.o. female with past medical history of ESRD on HD TTS, HTN, Multiple Myeloma (under Dr. Calton Dach care) who presented to [facility] ED status post fall and sustaining right distal femoral fracture.  Have opted to not have surgical repair of femur at this time.   Clinical Impression  Pt was anxious and had difficulty focusing attention throughout tx but was happy to perform activity. Pt demonstrated impulsive behavior during mobility but did adhere to NWB status during mobility and transfers. She also presents with decreased R LE strength, impaired standing balance, and decreased safety with transfers. Education provided on WB status, transferring only with staff, and HEP. She would benefit from acute PT to increase safety awareness with transfers and mobility, improve LE strength, endurance, and balance. D/C to SNF is most appropriate at this time, pt verbally agreeable with POC.     Follow Up Recommendations SNF    Equipment Recommendations  3in1 (PT)    Recommendations for Other Services       Precautions / Restrictions Precautions Precautions: Fall Required Braces or Orthoses: Knee Immobilizer - Right Knee Immobilizer - Right: On at all times Restrictions Weight Bearing Restrictions: Yes RLE Weight Bearing: Non weight bearing      Mobility  Bed Mobility Overal bed mobility: Needs Assistance Bed Mobility: Supine to Sit     Supine to sit: Supervision     General bed mobility comments: Pt was able to move R LE with arms. VCs for sequencing. Increased time needed. Pt attempted to go immediately into squat pivot to chair upon getting to EOB.   Transfers Overall transfer level: Needs assistance Equipment used: 2 person hand held assist Transfers: Set designer Transfers;Sit to/from Stand Sit to Stand: +2 safety/equipment;Mod assist    Squat pivot transfers: Min assist     General transfer comment: Min assist to help pivot all of the way to the chair and maintain safety. Pt quickly tried to perform squat pivot transfer once EOB without cues from PT. Mod assist for powering up during sit to stand and stability once standing. Blocked L knee for safety. Mod assist and cues to maintain R LE NWB. Max VCs for sequncing and hand placement.  Ambulation/Gait                Stairs            Wheelchair Mobility    Modified Rankin (Stroke Patients Only)       Balance Overall balance assessment: Needs assistance Sitting-balance support: Feet supported Sitting balance-Leahy Scale: Fair     Standing balance support: Bilateral upper extremity supported Standing balance-Leahy Scale: Zero Standing balance comment: Pt required full support from PT. BOS was shifted completely to L side so pt had R lateral lean                             Pertinent Vitals/Pain Pain Assessment: Faces Faces Pain Scale: Hurts little more Pain Location: R knee Pain Descriptors / Indicators: Discomfort;Grimacing Pain Intervention(s): Limited activity within patient's tolerance;Monitored during session;Repositioned;RN gave pain meds during session    Home Living Family/patient expects to be discharged to:: Skilled nursing facility                      Prior Function Level of Independence: Independent with assistive device(s)  Comments: Pt lives alone. She stated she used a RW occasionally but could not elaborate on when she used it     Hand Dominance   Dominant Hand: Right    Extremity/Trunk Assessment   Upper Extremity Assessment: Overall WFL for tasks assessed           Lower Extremity Assessment: RLE deficits/detail      Cervical / Trunk Assessment: Normal  Communication   Communication: No difficulties  Cognition Arousal/Alertness: Awake/alert Behavior During Therapy:  Anxious Overall Cognitive Status: No family/caregiver present to determine baseline cognitive functioning                      General Comments      Exercises General Exercises - Lower Extremity Ankle Circles/Pumps: AROM;Seated;Both;15 reps      Assessment/Plan    PT Assessment Patient needs continued PT services  PT Diagnosis Difficulty walking;Generalized weakness;Acute pain   PT Problem List Decreased strength;Decreased range of motion;Decreased activity tolerance;Decreased balance;Decreased mobility;Decreased safety awareness;Decreased knowledge of use of DME  PT Treatment Interventions DME instruction;Gait training;Functional mobility training;Therapeutic exercise;Balance training;Patient/family education   PT Goals (Current goals can be found in the Care Plan section) Acute Rehab PT Goals Patient Stated Goal: To return home and be independent PT Goal Formulation: With patient Time For Goal Achievement: 09/13/15 Potential to Achieve Goals: Fair    Frequency Min 3X/week   Barriers to discharge Decreased caregiver support Lives alone    Co-evaluation               End of Session Equipment Utilized During Treatment: Gait belt;Right knee immobilizer Activity Tolerance: Patient tolerated treatment well Patient left: in chair;with nursing/sitter in room;with call bell/phone within reach Nurse Communication: Mobility status;Weight bearing status         Time: 1347-1405 PT Time Calculation (min) (ACUTE ONLY): 18 min   Charges:   PT Evaluation $Initial PT Evaluation Tier I: 1 Procedure     PT G CodesHaynes Bast 09/09/2015, 2:26 PM Haynes Bast, SPT 09-09-15 2:33 PM

## 2015-09-06 NOTE — Progress Notes (Signed)
Reported to dialysis RN.  Meds held until after dialysis.

## 2015-09-06 NOTE — Progress Notes (Signed)
PT Cancellation Note  Patient Details Name: [name] MRN: [id number] DOB: 1940/10/26   Cancelled Treatment:    Reason Eval/Treat Not Completed: Patient at procedure or test/unavailable (pt currently off unit in HD will attempt in PM as time allows)   Lanetta Inch [facility] 09/06/2015, 9:04 AM Elwyn Reach, [place]

## 2015-09-06 NOTE — Progress Notes (Signed)
Tx completed at 1308.  Assessment unchanged.  Report called to bedside RN, Minna Merritts.   2 units PRBC given during tx.  Goal of 3838mL ultrafiltrated with net of 2.5L.

## 2015-09-06 NOTE — Progress Notes (Signed)
Had discussion with patient at bedside.  She does not want surgery given the risks.  She understands the implications of nonsurgical treatment which include chronic pain, shortened leg, ambulating short distances if at all.  Certainly I do understand and agree that surgery is of significant risk.  We will plan on treating this fracture nonop.  Follow up in office in 2 weeks.  KI at all times.  Azucena Cecil, MD [place] 8:07 AM

## 2015-09-06 NOTE — Progress Notes (Signed)
Hemoglobin 7.1   Per Dr. Justin Mend, 2 units PRBC ordered.  Volume accounted for.  Will continue to monitor.

## 2015-09-06 NOTE — Progress Notes (Signed)
Patient ID: [name], female   DOB: [birth year], 74 y.o.   MRN: [id number] [facility] PROGRESS NOTE  [name] WUJ:[id number] DOB: 18-Mar-1941 DOA: 09/01/2015 PCP: Benito Mccreedy, MD  Brief narrative:    74 y.o. female with past medical history of ESRD on HD TTS, HTN, Multiple Myeloma (under Dr. Calton Dach care) who presented to [facility] ED status post fall and sustaining right distal femoral fracture.  Resfusing surgery-- not planning on continuing chemo at [facility] currently.  Wants to go to [facility] for rehab  Barrier to D/c-- being able to tolerate HD in chair  Assessment/Plan:    Closed fracture of right distal femur ([place]) / Fall -patient refusing surgery -knee imobilizer  ESRD (end stage renal disease) ([place]) with hyperkalemia  - Appreciate renal following--WAS NOT DIALYZED IN CHAIR TODAY - HD TTS - Continue sensipar, aranesp  Hypertension associatd with end stage renal disease on dialysis - Continue lasix 20 mg daily and Imdur 60 mg daily   Leukopenia - Due to history of multiple myeloma - Stable   Multiple myeloma - Recent visit in 03/2015 with Dr. Lottie Rater at which time recommendation was for palliative care and hospice but pt was not ready to accept this at that time - Per [name] in 03/2015 "I would not recommend any form of further palliative chemotherapy given poor response rates to further palliative treatment and potential significant risk of side effects. I recommend palliative care and hospice but the patient cannot accept the concept of not going for further treatment. She cannot accept the fact that some of the side effects of treatment could potentially be life-threatening and can shorten her survivor without bringing any additional benefit. After very prolonged discussion, ultimately she wants to be referred to [facility] for second opinion."-- getting chemo at [facility]- Dr. Amalia Hailey q wednesday  Anemia of chronic disease - Likely combination of ERDS and  multiple myeloma - Hemoglobin 8.7 - Continue aranesp and  Ferric gluconate   GERD (gastroesophageal reflux disease) - Continue Protonix   Malnutrition of moderate degree - In the context of chronic illness   - Seen by dietician   DVT Prophylaxis  - SCD's bilaterally in hospital   Code Status: Full.  Family Communication:  Daughter on phone 12/5 Disposition Plan: SNF-- when tolerates HD in chair    IV access:  Peripheral IV  Procedures and diagnostic studies:    Dg Chest 2 View 09/03/2015 Stable exam.  No evidence of active cardiopulmonary disease. Electronically Signed   By: Monte Fantasia M.D.   On: 09/03/2015 08:58   Ct Knee Right Wo Contrast 09/02/2015  Severely comminuted complex distal femoral fracture. Mild lateral subluxation of the lateral femoral condyle fragment respect to the lateral tibial plateau. Electronically Signed   By: Andreas Newport M.D.   On: 09/02/2015 00:02   Dg Knee Complete 4 Views Right 09/01/2015  1. Severely comminuted fracture of the distal femoral metaphysis with a vertically oriented fracture cleft involving the lateral femoral condyle. Electronically Signed   By: Kathreen Devoid   On: 09/01/2015 22:49   Dg Hip Unilat With Pelvis 2-3 Views Right 09/01/2015 No fracture or dislocation. Electronically Signed   By: Anner Crete M.D.   On: 09/01/2015 22:52   Medical Consultants:  Orthopedic surgery  Nephrology   Other Consultants:  PT  Nutrition  IAnti-Infectives:   None    JESSICA UPCHURCH VANN, DO  [facility] Pager [phone]  Time spent in minutes: 25 minutes  If 7PM-7AM, please  contact night-coverage [email or web address] Password [email or web address] 09/06/2015, 12:41 PM   LOS: 4 days    HPI/Subjective: Did not get dialysis in chair but said she sat in char in from [place] to [place] yesterday--- can not find any documentation of this  Objective: Filed Vitals:   09/06/15 1121 09/06/15 1200 09/06/15 1206 09/06/15 1221  BP: 115/67 122/82 123/78 118/84   Pulse: 68 71 71 74  Temp:  97.9 F (36.6 C)  98 F (36.7 C)  TempSrc:  Oral  Oral  Resp:  19  19  Weight:      SpO2:        Intake/Output Summary (Last 24 hours) at 09/06/15 1241 Last data filed at 09/06/15 0600  Gross per 24 hour  Intake    480 ml  Output      0 ml  Net    480 ml    Exam:   General:  Pt is not in acute distress- on dialysis  Cardiovascular: Regular rate and rhythm, S1/S2(+)  Respiratory: No wheezing, no crackles, no rhonchi  Abdomen: (+) BS, non tender  Extremities: trace pedal edema, pulses palpable bilaterally   Data Reviewed: Basic Metabolic Panel:  Recent Labs Lab 09/01/15 2312 09/02/15 0720 09/03/15 0444 09/03/15 2154 09/06/15 0915  NA 135 135 130* 130* 123*  K 4.4 5.2* 6.2* 4.1 5.6*  CL 95* 96* 94* 91* 88*  CO2 34* '30 29 29 25  ' GLUCOSE 138* 95 113* 90 118*  BUN 25* 28* 46* 30* 59*  CREATININE 5.23* 5.69* 7.66* 5.15* 8.62*  CALCIUM 9.1 8.6* 8.9 8.6* 7.9*  PHOS  --   --  5.6*  --   --    Liver Function Tests:  Recent Labs Lab 09/03/15 0444  ALBUMIN 2.6*   No results for input(s): LIPASE, AMYLASE in the last 168 hours. No results for input(s): AMMONIA in the last 168 hours. CBC:  Recent Labs Lab 09/01/15 2312 09/02/15 0720 09/03/15 0444 09/06/15 0915  WBC 3.8* 4.2 3.9* 2.1*  NEUTROABS 3.3  --   --  1.5*  HGB 10.7* 9.6* 8.7* 7.1*  HCT 34.8* 31.4* 28.5* 22.3*  MCV 103.0* 102.6* 101.8* 97.8  PLT 162 160 167 164   Cardiac Enzymes: No results for input(s): CKTOTAL, CKMB, CKMBINDEX, TROPONINI in the last 168 hours. BNP: Invalid input(s): POCBNP CBG: No results for input(s): GLUCAP in the last 168 hours.  No results found for this or any previous visit (from the past 240 hour(s)).   Scheduled Meds: . sodium chloride   Intravenous Once  . cinacalcet  30 mg Oral Q breakfast  . [START ON 09/08/2015] darbepoetin (ARANESP) injection - DIALYSIS  150 mcg Intravenous Q Thu-HD  . doxercalciferol  4 mcg Intravenous Q  T,Th,Sa-HD  . feeding supplement  1 Container Oral TID BM  . [START ON 09/08/2015] ferric gluconate (FERRLECIT/NULECIT) IV  125 mg Intravenous Q Thu-HD  . furosemide  20 mg Oral Daily  . heparin  5,000 Units Subcutaneous 3 times per day  . isosorbide mononitrate  60 mg Oral Daily  . valACYclovir  500 mg Oral Daily   Continuous Infusions:

## 2015-09-06 NOTE — Progress Notes (Signed)
Pt arrived to floor per bed at 0905.  Report received from bedside RN, Minna Merritts.  A & O X 4 Lungs clear to auscultation. Regular R&R, PVC's at times. No edema.  LUAVF accessed with 15 gauge needles.  Pulsation of blood noted.  Flushed with NS.  Tx initiated at 0920 with goal of 3060mL and net fluid removal of 2.5L.  Will continue to monitor.

## 2015-09-06 NOTE — Procedures (Signed)
I have seen and examined this patient and agree with the plan of care . Patient seen on dialysis  [facility] W 09/06/2015, 9:58 AM

## 2015-09-07 ENCOUNTER — Encounter (HOSPITAL_COMMUNITY)
Admission: EM | Disposition: A | Discharge status: Skilled Nursing Facility | Payer: Self-pay | Source: Home / Self Care | Attending: Internal Medicine

## 2015-09-07 LAB — TYPE AND SCREEN
ABO/RH(D): O POS
ANTIBODY SCREEN: NEGATIVE
UNIT DIVISION: 0
Unit division: 0

## 2015-09-07 LAB — CBC
HEMATOCRIT: 27.9 % — AB (ref 36.0–46.0)
HEMOGLOBIN: 8.9 g/dL — AB (ref 12.0–15.0)
MCH: 31.1 pg (ref 26.0–34.0)
MCHC: 31.9 g/dL (ref 30.0–36.0)
MCV: 97.6 fL (ref 78.0–100.0)
Platelets: 155 10*3/uL (ref 150–400)
RBC: 2.86 MIL/uL — ABNORMAL LOW (ref 3.87–5.11)
RDW: 16.8 % — AB (ref 11.5–15.5)
WBC: 1.7 10*3/uL — ABNORMAL LOW (ref 4.0–10.5)

## 2015-09-07 SURGERY — TOTAL KNEE REVISION
Anesthesia: General | Laterality: Right

## 2015-09-07 MED ORDER — SODIUM CHLORIDE 0.9 % IV SOLN: 125.0000 mg | INTRAVENOUS | Status: DC

## 2015-09-07 MED ORDER — HYDROCODONE-ACETAMINOPHEN 5-325 MG PO TABS: 1.0000 | ORAL_TABLET | Freq: Four times a day (QID) | ORAL | Status: DC | PRN

## 2015-09-07 MED ORDER — DOXERCALCIFEROL 4 MCG/2ML IV SOLN: 4.0000 ug | INTRAVENOUS | Status: DC

## 2015-09-07 MED ORDER — WHITE PETROLATUM GEL
Status: AC
  Filled 2015-09-07: qty 1

## 2015-09-07 MED ORDER — HYDRALAZINE HCL 10 MG PO TABS: 10.0000 mg | ORAL_TABLET | Freq: Three times a day (TID) | ORAL | Status: DC

## 2015-09-07 MED ORDER — BISACODYL 10 MG RE SUPP: 10.0000 mg | Freq: Every day | RECTAL | Status: DC | PRN

## 2015-09-07 MED ORDER — DARBEPOETIN ALFA 150 MCG/0.3ML IJ SOSY: 150.0000 ug | PREFILLED_SYRINGE | INTRAMUSCULAR | Status: DC

## 2015-09-07 MED ORDER — ZOLPIDEM TARTRATE 5 MG PO TABS: 5.0000 mg | ORAL_TABLET | Freq: Every evening | ORAL | Status: DC | PRN

## 2015-09-07 NOTE — Discharge Instructions (Signed)
Hip Fracture A hip fracture is a fracture of the upper part of your thigh bone (femur).  CAUSES A hip fracture is caused by a direct blow to the side of your hip. This is usually the result of a fall but can occur in other circumstances, such as an automobile accident. RISK FACTORS There is an increased risk of hip fractures in people with:  An unsteady walking pattern (gait) and those with conditions that contribute to poor balance, such as Parkinson's disease or dementia.  Osteopenia and osteoporosis.  Cancer that spreads to the leg bones.  Certain metabolic diseases. SYMPTOMS  Symptoms of hip fracture include:  Pain over the injured hip.  Inability to put weight on the leg in which the fracture occurred (although, some patients are able to walk after a hip fracture).  Toes and foot of the affected leg point outward when you lie down. DIAGNOSIS A physical exam can determine if a hip fracture is likely to have occurred. X-ray exams are needed to confirm the fracture and to look for other injuries. The X-ray exam can help to determine the type of hip fracture. Rarely, the fracture is not visible on an X-ray image and a CT scan or MRI will have to be done. TREATMENT  The treatment for a fracture is usually surgery. This means using a screw, nail, or rod to hold the bones in place.  HOME CARE INSTRUCTIONS Take all medicines as directed by your health care provider. SEEK MEDICAL CARE IF: Pain continues, even after taking pain medicine. MAKE SURE YOU:  Understand these instructions.   Will watch your condition.  Will get help right away if you are not doing well or get worse.   This information is not intended to replace advice given to you by your health care provider. Make sure you discuss any questions you have with your health care provider.   Document Released: 09/17/2005 Document Revised: 09/22/2013 Document Reviewed: 04/29/2013 Elsevier Interactive Patient Education 2016  Elsevier Inc.  

## 2015-09-07 NOTE — Clinical Social Work Note (Signed)
CSW continuing to follow and assist with discharge plan to [facility] once medically stable for discharge.  Lubertha Sayres, [facility]: [phone] Surgical: [phone]

## 2015-09-07 NOTE — Discharge Summary (Signed)
Physician Discharge Summary  [name] NOM:[id number] DOB: 1941-07-26 DOA: 09/01/2015  PCP: Carrie Mccreedy, MD  Admit date: 09/01/2015 Discharge date: 09/08/2015  Recommendations for Outpatient Follow-up:  1. Pt will need to follow up with PCP in 1-2 weeks post discharge  Discharge Diagnoses:  Principal Problem:   Closed fracture of right distal femur ([place]) Active Problems:   ESRD (end stage renal disease) ([place])   Multiple myeloma ([place])   Hypertension associatd with end stage renal disease on dialysis   Anemia in neoplastic disease   Fall   GERD (gastroesophageal reflux disease)   Malnutrition of moderate degree   Leukopenia due to antineoplastic chemotherapy   Hyperkalemia  Discharge Condition: Stable  Diet recommendation: Renal diet   Brief narrative:    74 y.o. female with past medical history of ESRD on HD TTS, HTN, Multiple Myeloma (under Dr. Calton Dach care) who presented to [facility] ED status post fall and sustaining right distal femoral fracture. Resfusing surgery-- not planning on continuing chemo at [facility] currently. Wants to go to [facility] for rehab  Barrier to D/c-- being able to tolerate HD in chair  Assessment/Plan:    Closed fracture of right distal femur ([place]) / Fall - knee immobilizer  - pain adequately controlled   ESRD (end stage renal disease) ([place]) with hyperkalemia and hyponatremia  - Appreciate renal following - tolerating HD  - HD TTS - Continue sensipar, aranesp  Hypertension associatd with end stage renal disease on dialysis - Continue lasix 20 mg daily and Imdur 60 mg daily   Leukopenia - Due to history of multiple myeloma - Stable   Multiple myeloma - Recent visit in 03/2015 with Dr. Lottie Rater at which time recommendation was for palliative care and hospice but pt was not ready to accept this at that time - Per [name] in 03/2015 "I would not recommend any form of further palliative chemotherapy given poor response rates to further  palliative treatment and potential significant risk of side effects. I recommend palliative care and hospice but the patient cannot accept the concept of not going for further treatment. She cannot accept the fact that some of the side effects of treatment could potentially be life-threatening and can shorten her survivor without bringing any additional benefit. After very prolonged discussion, ultimately she wants to be referred to [facility] for second opinion."-- getting chemo at [facility]- Dr. Amalia Hailey q wednesday  Anemia of chronic disease - Likely combination of ERDS and multiple myeloma - Continue aranesp and Ferric gluconate   GERD (gastroesophageal reflux disease) - Continue Protonix   Malnutrition of moderate degree - In the context of chronic illness  - Seen by dietician   DVT Prophylaxis  - SCD's bilaterally in hospital   Code Status: Full.  Family Communication: Daughter on phone 12/5 Disposition Plan: SNF    IV access:  Peripheral IV  Procedures and diagnostic studies:   Dg Chest 2 View 09/03/2015 Stable exam. No evidence of active cardiopulmonary disease. Electronically Signed By: Monte Fantasia M.D. On: 09/03/2015 08:58   Ct Knee Right Wo Contrast 09/02/2015 Severely comminuted complex distal femoral fracture. Mild lateral subluxation of the lateral femoral condyle fragment respect to the lateral tibial plateau. Electronically Signed By: Andreas Newport M.D. On: 09/02/2015 00:02   Dg Knee Complete 4 Views Right 09/01/2015 1. Severely comminuted fracture of the distal femoral metaphysis with a vertically oriented fracture cleft involving the lateral femoral condyle. Electronically Signed By: Kathreen Devoid On: 09/01/2015 22:49   Dg Hip Unilat With  Pelvis 2-3 Views Right 09/01/2015 No fracture or dislocation. Electronically Signed By: Anner Crete M.D. On: 09/01/2015 22:52   Medical Consultants:  Orthopedic surgery  Nephrology    Other Consultants:  PT  Nutrition  IAnti-Infectives:   None        Discharge Exam: Filed Vitals:   09/06/15 1939 09/07/15 0454  BP: 115/69 143/76  Pulse: 90 66  Temp: 98.7 F (37.1 C) 99.1 F (37.3 C)  Resp: 16 16   Filed Vitals:   09/06/15 1335 09/06/15 1430 09/06/15 1939 09/07/15 0454  BP: 132/95 105/68 115/69 143/76  Pulse: 77 77 90 66  Temp: 98.2 F (36.8 C) 99.3 F (37.4 C) 98.7 F (37.1 C) 99.1 F (37.3 C)  TempSrc: Oral  Oral   Resp: _0 Weight:      SpO2: 99% 99% 100% 100%    General: Pt is alert, follows commands appropriately, not in acute distress Cardiovascular: Regular rate and rhythm, no rubs, no gallops Respiratory: Clear to auscultation bilaterally, no wheezing, no crackles, no rhonchi Abdominal: Soft, non tender, non distended, bowel sounds +, no guarding  Discharge Instructions     Medication List    TAKE these medications        amLODipine 10 MG tablet  Commonly known as:  NORVASC  Take 0.5 tablets (5 mg total) by mouth daily.     bisacodyl 10 MG suppository  Commonly known as:  DULCOLAX  Place 1 suppository (10 mg total) rectally daily as needed for moderate constipation.     cinacalcet 30 MG tablet  Commonly known as:  SENSIPAR  Take 30 mg by mouth daily.     Darbepoetin Alfa 150 MCG/0.3ML Sosy injection  Commonly known as:  ARANESP  Inject 0.3 mLs (150 mcg total) into the vein every Thursday with hemodialysis.  Start taking on:  09/08/2015     doxercalciferol 4 MCG/2ML injection  Commonly known as:  HECTOROL  Inject 2 mLs (4 mcg total) into the vein Every Tuesday,Thursday,and Saturday with dialysis.     feeding supplement (NEPRO CARB STEADY) Liqd  Take 237 mLs by mouth 2 (two) times daily between meals.     ferric gluconate 125 mg in sodium chloride 0.9 % 100 mL  Inject 125 mg into the vein every Thursday with hemodialysis.  Start taking on:  09/08/2015     furosemide 20 MG tablet  Commonly known as:   LASIX  Take 1 tablet (20 mg total) by mouth daily. PRN for leg edema     hydrALAZINE 10 MG tablet  Commonly known as:  APRESOLINE  Take 1 tablet (10 mg total) by mouth 3 (three) times daily.     HYDROcodone-acetaminophen 5-325 MG tablet  Commonly known as:  NORCO/VICODIN  Take 1-2 tablets by mouth every 6 (six) hours as needed for moderate pain.     isosorbide mononitrate 60 MG 24 hr tablet  Commonly known as:  IMDUR  Take 60 mg by mouth daily.     multivitamin Tabs tablet  Take 1 tablet by mouth at bedtime.     ondansetron 4 MG disintegrating tablet  Commonly known as:  ZOFRAN ODT  Take 1 tablet (4 mg total) by mouth every 8 (eight) hours as needed for nausea or vomiting.     pantoprazole 40 MG tablet  Commonly known as:  PROTONIX  TAKE 1 TABLET BY MOUTH DAILY     prochlorperazine 10 MG tablet  Commonly known as:  COMPAZINE  Take 1  tablet (10 mg total) by mouth every 6 (six) hours as needed (Nausea or vomiting).     traZODone 50 MG tablet  Commonly known as:  DESYREL  Take 0.5-1 tablets (25-50 mg total) by mouth at bedtime as needed for sleep.     valACYclovir 500 MG tablet  Commonly known as:  VALTREX  Take 500 mg by mouth daily.     zolpidem 5 MG tablet  Commonly known as:  AMBIEN  Take 1 tablet (5 mg total) by mouth at bedtime as needed for sleep.            Follow-up Information    Follow up with Marianna Payment, MD In 2 weeks.   Specialty:  Orthopedic Surgery   Why:  for fracture follow up   Contact information:   [place] [address] [phone]       Follow up with Carrie Mccreedy, MD.   Specialty:  Internal Medicine   Contact information:   [address] [place] [address] [phone]        The results of significant diagnostics from this hospitalization (including imaging, microbiology, ancillary and laboratory) are listed below for reference.     Microbiology: No results found for this  or any previous visit (from the past 240 hour(s)).   Labs: Basic Metabolic Panel:  Recent Labs Lab 09/01/15 2312 09/02/15 0720 09/03/15 0444 09/03/15 2154 09/06/15 0915  NA 135 135 130* 130* 123*  K 4.4 5.2* 6.2* 4.1 5.6*  CL 95* 96* 94* 91* 88*  CO2 34* _0 GLUCOSE 138* 95 113* 90 118*  BUN 25* 28* 46* 30* 59*  CREATININE 5.23* 5.69* 7.66* 5.15* 8.62*  CALCIUM 9.1 8.6* 8.9 8.6* 7.9*  PHOS  --   --  5.6*  --   --    Liver Function Tests:  Recent Labs Lab 09/03/15 0444  ALBUMIN 2.6*   No results for input(s): LIPASE, AMYLASE in the last 168 hours. No results for input(s): AMMONIA in the last 168 hours. CBC:  Recent Labs Lab 09/01/15 2312 09/02/15 0720 09/03/15 0444 09/06/15 0915 09/07/15 1029  WBC 3.8* 4.2 3.9* 2.1* 1.7*  NEUTROABS 3.3  --   --  1.5*  --   HGB 10.7* 9.6* 8.7* 7.1* 8.9*  HCT 34.8* 31.4* 28.5* 22.3* 27.9*  MCV 103.0* 102.6* 101.8* 97.8 97.6  PLT 162 160 167 164 155   SIGNED: Time coordinating discharge: 30 minutes  MAGICK-MYERS, ISKRA, MD  [facility] 09/07/2015, 11:11 AM Pager [phone]  If 7PM-7AM, please contact night-coverage [email or web address] Password [email or web address]

## 2015-09-07 NOTE — Progress Notes (Signed)
Subjective:  HD yest but in Bed  Objective Vital signs in last 24 hours: Filed Vitals:   09/06/15 1335 09/06/15 1430 09/06/15 1939 09/07/15 0454  BP: 132/95 105/68 115/69 143/76  Pulse: 77 77 90 66  Temp: 98.2 F (36.8 C) 99.3 F (37.4 C) 98.7 F (37.1 C) 99.1 F (37.3 C)  TempSrc: Oral  Oral   Resp: 16 16 16 16   Weight:      SpO2: 99% 99% 100% 100%   Weight change:   Physical Exam: General: alert OX3, talkative AAf , NAD ,  Heart: RRR no rug, mur , gallop Lungs: CTA  Abdomen: soft , NT, ND Extremities:R Lower Leg brace/no pedal edema  Dialysis Access: LUA AVF pos bruit    OP HD= AF TTS 3h 72min 2/2.25 bath 57kg Hep none LUA AVF Hect 4 ug ARanesp 120/ thurs Venofer 50 / thurs Labs Hb 11 Ca 11.2 Phos 6.5 pth 529  Problem/Plan: 1 R femur fracture= . Pt refuses surgery / Noted Ortho to fu as OP 2 ESRD= HD TTS ([facility] ) needs to do HD in [place]  Before DC  Or NOT ABLE for OP HD , I dw her this am she said no problem sitting in chair anyway. Attempt first shift HD in [place] in am 3 HTN/ vol = 115/69 am bp /amlod/ hydral  dc with bp's soft/vol wise BED wts not totally accurate with wt post hd yest 62 ?? 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 6 Mult myeloma =failed numerous attempts at chemo=she tells me again this am going to [facility] now for treaments   7 Nutrition mvi/ supp  Ernest Haber, PA-C [place] [phone] 09/07/2015,9:30 AM  LOS: 5 days   Labs: Basic Metabolic Panel:  Recent Labs Lab 09/03/15 0444 09/03/15 2154 09/06/15 0915  NA 130* 130* 123*  K 6.2* 4.1 5.6*  CL 94* 91* 88*  CO2 29 29 25   GLUCOSE 113* 90 118*  BUN 46* 30* 59*  CREATININE 7.66* 5.15* 8.62*  CALCIUM 8.9 8.6* 7.9*  PHOS 5.6*  --   --    Liver Function  Tests:  Recent Labs Lab 09/03/15 0444  ALBUMIN 2.6*   No results for input(s): LIPASE, AMYLASE in the last 168 hours. No results for input(s): AMMONIA in the last 168 hours. CBC:  Recent Labs Lab 09/01/15 2312 09/02/15 0720 09/03/15 0444 09/06/15 0915  WBC 3.8* 4.2 3.9* 2.1*  NEUTROABS 3.3  --   --  1.5*  HGB 10.7* 9.6* 8.7* 7.1*  HCT 34.8* 31.4* 28.5* 22.3*  MCV 103.0* 102.6* 101.8* 97.8  PLT 162 160 167 164   Cardiac Enzymes: No results for input(s): CKTOTAL, CKMB, CKMBINDEX, TROPONINI in the last 168 hours. CBG: No results for input(s): GLUCAP in the last 168 hours.  Studies/Results: No results found. Medications:   . sodium chloride   Intravenous Once  . cinacalcet  30 mg Oral Q breakfast  . [START ON 09/08/2015] darbepoetin (ARANESP) injection - DIALYSIS  150 mcg Intravenous Q Thu-HD  . doxercalciferol  4 mcg Intravenous Q T,Th,Sa-HD  . feeding supplement  1 Container Oral TID BM  . [START ON 09/08/2015] ferric gluconate (FERRLECIT/NULECIT) IV  125 mg Intravenous Q Thu-HD  .  furosemide  20 mg Oral Daily  . heparin  5,000 Units Subcutaneous 3 times per day  . isosorbide mononitrate  60 mg Oral Daily  . valACYclovir  500 mg Oral Daily

## 2015-09-07 NOTE — Progress Notes (Signed)
Plan for discharge to SNF in AM.  Faye Ramsay, MD  [facility] Pager [phone]  If 7PM-7AM, please contact night-coverage [email or web address] Password [email or web address]

## 2015-09-08 LAB — CBC
HEMATOCRIT: 27.3 % — AB (ref 36.0–46.0)
Hemoglobin: 8.7 g/dL — ABNORMAL LOW (ref 12.0–15.0)
MCH: 30.4 pg (ref 26.0–34.0)
MCHC: 31.9 g/dL (ref 30.0–36.0)
MCV: 95.5 fL (ref 78.0–100.0)
Platelets: 173 10*3/uL (ref 150–400)
RBC: 2.86 MIL/uL — ABNORMAL LOW (ref 3.87–5.11)
RDW: 15.7 % — AB (ref 11.5–15.5)
WBC: 2.2 10*3/uL — AB (ref 4.0–10.5)

## 2015-09-08 LAB — BASIC METABOLIC PANEL
ANION GAP: 9 (ref 5–15)
BUN: 47 mg/dL — AB (ref 6–20)
CO2: 26 mmol/L (ref 22–32)
Calcium: 8.1 mg/dL — ABNORMAL LOW (ref 8.9–10.3)
Chloride: 90 mmol/L — ABNORMAL LOW (ref 101–111)
Creatinine, Ser: 8.22 mg/dL — ABNORMAL HIGH (ref 0.44–1.00)
GFR calc Af Amer: 5 mL/min — ABNORMAL LOW (ref >60)
GFR, EST NON AFRICAN AMERICAN: 4 mL/min — AB (ref >60)
Glucose, Bld: 104 mg/dL — ABNORMAL HIGH (ref 65–99)
POTASSIUM: 4.8 mmol/L (ref 3.5–5.1)
SODIUM: 125 mmol/L — AB (ref 135–145)

## 2015-09-08 MED ORDER — DARBEPOETIN ALFA 150 MCG/0.3ML IJ SOSY
PREFILLED_SYRINGE | INTRAMUSCULAR | Status: AC
  Filled 2015-09-08: qty 0.3

## 2015-09-08 MED ORDER — DOXERCALCIFEROL 4 MCG/2ML IV SOLN
INTRAVENOUS | Status: AC
  Filled 2015-09-08: qty 2

## 2015-09-08 NOTE — Progress Notes (Signed)
Subjective:  No cos /for HD today IN RECLINER DC IF Tolerates RECLINER HD/ If not needs LTAC  Objective Vital signs in last 24 hours: Filed Vitals:   09/07/15 1500 09/07/15 1935 09/07/15 2200 09/08/15 0549  BP: 134/88 136/76  148/72  Pulse: 78 50  64  Temp: 99.5 F (37.5 C) 100.5 F (38.1 C) 99.9 F (37.7 C) 98.7 F (37.1 C)  TempSrc:  Oral  Oral  Resp: 16 16  16   Weight:      SpO2: 98% 98%  99%   Weight change:  Physical Exam: General: alert OX3, talkative AAf , NAD ,  Heart: RRR no rug, mur , gallop Lungs: CTA  Abdomen: soft , NT, ND Extremities:R Lower Leg brace/no pedal edema Dialysis Access: LUA AVF pos bruit   OP HD= AF TTS 3h 36min 2/2.25 bath 57kg Hep none LUA AVF Hect 4 ug ARanesp 120/ thurs Venofer 50 / thurs Labs Hb 11 Ca 11.2 Phos 6.5 pth 529  Problem/Plan: 1 R femur fracture= . Pt refuses surgery /  For DC to REHAB ,NH today IF TOLERATES RECLINER HD,Noted Ortho to fu as OP 2 ESRD= HD TTS ([facility] ) needs to do HD in Recliner Before DC Or NOT ABLE for OP HD. She feels she can . 3 HTN/ vol = stable  am bp /amlod/ hydral  dc with bp's soft/vol wise BED wts not totally accurate with wt post hd 12/06 62 ?? 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 6 Mult myeloma =failed numerous attempts at chemo=she tells me again this am going to [facility] now for treaments  7 Nutrition mvi/ supp   Ernest Haber, PA-C [place] [phone] 09/08/2015,9:14 AM  LOS: 6 days   Labs: Basic Metabolic Panel:  Recent Labs Lab 09/03/15 0444 09/03/15 2154 09/06/15 0915  NA 130* 130* 123*  K 6.2* 4.1 5.6*  CL 94* 91* 88*  CO2 29 29 25   GLUCOSE 113* 90 118*  BUN 46* 30* 59*  CREATININE 7.66* 5.15* 8.62*  CALCIUM 8.9  8.6* 7.9*  PHOS 5.6*  --   --    Liver Function Tests:  Recent Labs Lab 09/03/15 0444  ALBUMIN 2.6*   No results for input(s): LIPASE, AMYLASE in the last 168 hours. No results for input(s): AMMONIA in the last 168 hours. CBC:  Recent Labs Lab 09/01/15 2312 09/02/15 0720 09/03/15 0444 09/06/15 0915 09/07/15 1029  WBC 3.8* 4.2 3.9* 2.1* 1.7*  NEUTROABS 3.3  --   --  1.5*  --   HGB 10.7* 9.6* 8.7* 7.1* 8.9*  HCT 34.8* 31.4* 28.5* 22.3* 27.9*  MCV 103.0* 102.6* 101.8* 97.8 97.6  PLT 162 160 167 164 155   Cardiac Enzymes: No results for input(s): CKTOTAL, CKMB, CKMBINDEX, TROPONINI in the last 168 hours. CBG: No results for input(s): GLUCAP in the last 168 hours.  Studies/Results: No results found. Medications:   . sodium chloride   Intravenous Once  . cinacalcet  30 mg Oral Q breakfast  . darbepoetin (ARANESP) injection - DIALYSIS  150 mcg Intravenous Q Thu-HD  . doxercalciferol  4 mcg Intravenous Q T,Th,Sa-HD  . feeding supplement  1 Container Oral TID BM  . ferric  gluconate (FERRLECIT/NULECIT) IV  125 mg Intravenous Q Thu-HD  . furosemide  20 mg Oral Daily  . heparin  5,000 Units Subcutaneous 3 times per day  . isosorbide mononitrate  60 mg Oral Daily  . valACYclovir  500 mg Oral Daily

## 2015-09-08 NOTE — Progress Notes (Signed)
Pt to dialysis via recliner with dialysis staff. Rept given to dialysis RN. No change from AM assessment. Pt given one pain pill prior to transport to dialysis.

## 2015-09-08 NOTE — Progress Notes (Signed)
Physical Therapy Treatment Patient Details Name: [name] MRN: [id number] DOB: Dec 13, 1940 Today's Date: 09/08/2015    History of Present Illness 74 y.o. female with past medical history of ESRD on HD TTS, HTN, Multiple Myeloma (under Dr. Calton Dach care) who presented to [facility] ED status post fall and sustaining right distal femoral fracture.  Have opted to not have surgical repair of femur at this time.     PT Comments    Pt was eager to get OOB today and work with PT. She requires cues to redirect her attention and stay focused on the task. Pt still needs significant cueing to maintain NWB status during transfers/ambulation. Will continue to follow to increase R LE strength, functional mobility, and safety with mobility.   Follow Up Recommendations  SNF     Equipment Recommendations       Recommendations for Other Services       Precautions / Restrictions Precautions Precautions: Fall Required Braces or Orthoses: Knee Immobilizer - Right Knee Immobilizer - Right: On at all times Restrictions RLE Weight Bearing: Non weight bearing    Mobility  Bed Mobility Overal bed mobility: Needs Assistance Bed Mobility: Supine to Sit     Supine to sit: Min assist     General bed mobility comments: min assist to move RLE to EOB with cues for scooting. pt able to transfer supine to sit on her own  Transfers Overall transfer level: Needs assistance   Transfers: Sit to/from Stand Sit to Stand: Mod assist;+2 physical assistance;+2 safety/equipment         General transfer comment: pt with mod assist to achieve standing secondary to lack of pt assist to extend Left knee with cues with blocking of knee and hand over hand assist to move legs and position on surface. x 2 trials from bed and BSC. Max assist to sit in chair 2nd trial as pt with uncontrolled sporadic posterior lean  Ambulation/Gait Ambulation/Gait assistance: Min assist;+2 safety/equipment Ambulation Distance (Feet): 5  Feet Assistive device: Rolling walker (2 wheeled) Gait Pattern/deviations: Step-to pattern   Gait velocity interpretation: Below normal speed for age/gender General Gait Details: max cues for posture, safety, position in RW and RLE on P.T. foot to monitor weight bearing with chair pulled behind pt throughout   Stairs            Wheelchair Mobility    Modified Rankin (Stroke Patients Only)       Balance Overall balance assessment: Needs assistance   Sitting balance-Leahy Scale: Fair       Standing balance-Leahy Scale: Poor                      Cognition Arousal/Alertness: Awake/alert Behavior During Therapy: Anxious Overall Cognitive Status: Impaired/Different from baseline Area of Impairment: Safety/judgement;Following commands       Following Commands: Follows one step commands inconsistently Safety/Judgement: Decreased awareness of deficits;Decreased awareness of safety     General Comments: pt can be impulsive and does not consistently follow cues or commands    Exercises General Exercises - Lower Extremity Gluteal Sets: AROM;Both;15 reps;Seated Hip ABduction/ADduction: AROM;15 reps;Seated;Both Straight Leg Raises: AAROM;Right;20 reps;Seated    General Comments        Pertinent Vitals/Pain      Home Living                      Prior Function            PT Goals (current goals can  now be found in the care plan section) Progress towards PT goals: Progressing toward goals    Frequency       PT Plan Current plan remains appropriate    Co-evaluation             End of Session Equipment Utilized During Treatment: Gait belt;Right knee immobilizer Activity Tolerance: Patient tolerated treatment well Patient left: in chair;with call bell/phone within reach     Time: 0925-0949 PT Time Calculation (min) (ACUTE ONLY): 24 min  Charges:  $Gait Training: 8-22 mins $Therapeutic Exercise: 8-22 mins                    G  Codes:      Ashli Selders 2015-09-29, 10:49 AM

## 2015-09-08 NOTE — Procedures (Signed)
Patient was seen on dialysis and the procedure was supervised.  BFR 300  Via AVF BP is  147/88.   Patient appears to be tolerating treatment well  Liliani Bobo A 09/08/2015

## 2015-09-08 NOTE — Progress Notes (Signed)
Pt returned from dialysis at 1900. Rept given to [facility] at [facility]. Pt will be in Room 211 at [facility]. Night RN Benjamine Mola will call [facility] for transport. Pt remains unchanged from AM assessment. Pt tolerated dialysis without difficulty.

## 2015-09-08 NOTE — Clinical Social Work Placement (Signed)
   CLINICAL SOCIAL WORK PLACEMENT  NOTE  Date:  09/08/2015  Patient Details  Name: [name] MRN: [id number] Date of Birth: [birth year]  Clinical Social Work is seeking post-discharge placement for this patient at the [employer] level of care (*CSW will initial, date and re-position this form in  chart as items are completed):      Patient/family provided with [facility] Work Department's list of facilities offering this level of care within the geographic area requested by the patient (or if unable, by the patient's family).  Yes   Patient/family informed of their freedom to choose among providers that offer the needed level of care, that participate in Medicare, Medicaid or managed care program needed by the patient, have an available bed and are willing to accept the patient.  Yes   Patient/family informed of [facility]'s ownership interest in [facility] and [facility], as well as of the fact that they are under no obligation to receive care at these facilities.  PASRR submitted to EDS on       PASRR number received on       Existing PASRR number confirmed on 09/04/15     FL2 transmitted to all facilities in geographic area requested by pt/family on 09/04/15     FL2 transmitted to all facilities within larger geographic area on       Patient informed that his/her managed care company has contracts with or will negotiate with certain facilities, including the following:        Yes   Patient/family informed of bed offers received.  Patient chooses bed at [place] recommends and patient chooses bed at      Patient to be transferred to [facility] on 09/08/15.  Patient to be transferred to facility by [facility]     Patient family notified on 09/08/15 of transfer.  Name of family member notified:  Patient     PHYSICIAN       Additional Comment:     _______________________________________________ Caroline Sauger, LCSW 09/08/2015, 11:04 AM

## 2015-09-08 NOTE — Progress Notes (Signed)
Pt stable for discharge, please see d/c summary from 09/07/2015. HD scheduled TTS, plan on HD per renal team prior to discharge.  Faye Ramsay, MD  [facility] Pager [phone]  If 7PM-7AM, please contact night-coverage [email or web address] Password [email or web address]

## 2015-09-08 NOTE — Progress Notes (Signed)
Rept to Dr. Olen Pel. Pt did not leave for dialysis until 1400. Pt will be in dialysis approx 4 hours. Per Dr. Olen Pel order-proceed with plan of care and discharge to [facility] following dialysis.

## 2015-09-08 NOTE — Care Management Important Message (Signed)
Important Message  Patient Details  Name: [name] MRN: [id number] Date of Birth: [birth year]   Medicare Important Message Given:  Yes    Ticia Virgo P [place] 09/08/2015, 11:40 AM

## 2015-09-08 NOTE — Discharge Planning (Addendum)
Patient to discharge to [facility] pending completion of dialysis in recliner. Patient must tolerate dialysis in recliner prior to discharge to SNF. Patient aware of discharge.  Facility: [facility] RN report number: [phone] (Room 308) Transportation: EMS  RN to set up [place] (EMS) transportation upon patient returning to [facility] from dialysis. [facility]: [phone] BEFORE 5pm) - [phone] AFTER 5pm)  Lubertha Sayres, [facility]: [phone] Surgical: [phone]

## 2015-09-12 ENCOUNTER — Non-Acute Institutional Stay (SKILLED_NURSING_FACILITY): Payer: Medicare Other | Admitting: Internal Medicine

## 2015-09-12 ENCOUNTER — Encounter: Payer: Self-pay | Admitting: Internal Medicine

## 2015-09-12 DIAGNOSIS — R630 Anorexia: Secondary | ICD-10-CM

## 2015-09-12 DIAGNOSIS — W19XXXA Unspecified fall, initial encounter: Secondary | ICD-10-CM | POA: Diagnosis not present

## 2015-09-12 DIAGNOSIS — C9000 Multiple myeloma not having achieved remission: Secondary | ICD-10-CM

## 2015-09-12 DIAGNOSIS — S72401A Unspecified fracture of lower end of right femur, initial encounter for closed fracture: Secondary | ICD-10-CM

## 2015-09-12 DIAGNOSIS — N186 End stage renal disease: Secondary | ICD-10-CM

## 2015-09-12 DIAGNOSIS — Z992 Dependence on renal dialysis: Secondary | ICD-10-CM | POA: Diagnosis not present

## 2015-09-12 DIAGNOSIS — D72819 Decreased white blood cell count, unspecified: Secondary | ICD-10-CM

## 2015-09-12 DIAGNOSIS — D701 Agranulocytosis secondary to cancer chemotherapy: Secondary | ICD-10-CM

## 2015-09-12 DIAGNOSIS — D630 Anemia in neoplastic disease: Secondary | ICD-10-CM | POA: Diagnosis not present

## 2015-09-12 DIAGNOSIS — E43 Unspecified severe protein-calorie malnutrition: Secondary | ICD-10-CM | POA: Diagnosis not present

## 2015-09-12 DIAGNOSIS — K219 Gastro-esophageal reflux disease without esophagitis: Secondary | ICD-10-CM

## 2015-09-12 DIAGNOSIS — T451X5A Adverse effect of antineoplastic and immunosuppressive drugs, initial encounter: Secondary | ICD-10-CM

## 2015-09-12 NOTE — Progress Notes (Signed)
Patient ID: [name], female   DOB: 01-20-1941, 74 y.o.   MRN: [id number]    HISTORY AND PHYSICAL   DATE: 09/12/15  Location:  [facility]    Place of Service: SNF (31)   Extended Emergency Contact Information Primary Emergency Contact: [place] Phone: [phone] Mobile Phone: [phone] Relation: Daughter Secondary Emergency Contact: Alaimo,Lewis  [place] Mobile Phone: [phone] Relation: Brother  Advanced Directive information Does patient have an advance directive?: Yes, Type of Advance Directive: FULL CODE  Chief Complaint  Patient presents with  . 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  She was followed by H/O and was told her poor prognosis but pt, according H/O, unable to accept her terminal illness and they recommend no further palliative chemotx. She had a CT knee which revealed a severe communitated complex distal femur fx with moderate lateral subluxation of lateral femoral condyl fragment with respect to lateral tibial plateau.  She c/o loss of appetite. No other concerns. Pain well controlled. She is going to PT. No nursing issues. No falls since admission.  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 No pertinent past medical history.  Past Surgical History  Procedure Laterality Date  . Abdominal hysterectomy      Pt. denies    Patient Care Team: Benito Mccreedy, MD as PCP - General (Internal Medicine)  Social History   Social History  . Marital Status: Married    Spouse Name: N/A  . Number of Children: 3  .  Years of Education: N/A   Occupational History  . Retired      Pharmacist, hospital asst.   Social History Main Topics  . Smoking status: Never Smoker   . Smokeless tobacco: Never Used  . Alcohol Use: No  . Drug Use: No  . Sexual Activity: No   Other Topics Concern  . Not on file   Social History Narrative   Patient recently moved to [place] from [place] in May 2015.   [name] was born in [place] [employer], [place].   Patient has brother in [place] area.     reports that she has never smoked. She has never used smokeless tobacco. She reports that she does not drink alcohol or use illicit drugs.  Family History  Problem Relation Age of Onset  . Hypertension Mother   . Hypertension Father    Family Status  Relation Status Death Age  . Mother Deceased 40  . Father Deceased 75    Immunization History  Administered Date(s) Administered  . Influenza,inj,Quad PF,36+ Mos 07/23/2014    No Known Allergies  Medications: Patient's Medications  New Prescriptions   No medications on file  Previous Medications   AMLODIPINE (NORVASC) 10 MG TABLET    Take 0.5 tablets (5 mg total) by mouth daily.   BISACODYL (DULCOLAX) 10 MG SUPPOSITORY    Place 1 suppository (10 mg total) rectally daily as needed for moderate constipation.   CINACALCET (SENSIPAR) 30 MG TABLET    Take 30 mg by mouth daily.   DARBEPOETIN ALFA (ARANESP) 150  MCG/0.3ML SOSY INJECTION    Inject 0.3 mLs (150 mcg total) into the vein every Thursday with hemodialysis.   DOXERCALCIFEROL (HECTOROL) 4 MCG/2ML INJECTION    Inject 2 mLs (4 mcg total) into the vein Every Tuesday,Thursday,and Saturday with dialysis.   FERRIC GLUCONATE 125 MG IN SODIUM CHLORIDE 0.9 % 100 ML    Inject 125 mg into the vein every Thursday with hemodialysis.   FUROSEMIDE (LASIX) 20 MG TABLET    Take 1 tablet (20 mg total) by mouth daily. PRN for leg edema   HYDRALAZINE (APRESOLINE) 10 MG TABLET    Take 1 tablet (10 mg total) by mouth 3 (three) times daily.    HYDROCODONE-ACETAMINOPHEN (NORCO/VICODIN) 5-325 MG TABLET    Take 1-2 tablets by mouth every 6 (six) hours as needed for moderate pain.   ISOSORBIDE MONONITRATE (IMDUR) 60 MG 24 HR TABLET    Take 60 mg by mouth daily.   MULTIVITAMIN (RENA-VIT) TABS TABLET    Take 1 tablet by mouth at bedtime.   NUTRITIONAL SUPPLEMENTS (FEEDING SUPPLEMENT, NEPRO CARB STEADY,) LIQD    Take 237 mLs by mouth 2 (two) times daily between meals.   ONDANSETRON (ZOFRAN ODT) 4 MG DISINTEGRATING TABLET    Take 1 tablet (4 mg total) by mouth every 8 (eight) hours as needed for nausea or vomiting.   PANTOPRAZOLE (PROTONIX) 40 MG TABLET    TAKE 1 TABLET BY MOUTH DAILY   PROCHLORPERAZINE (COMPAZINE) 10 MG TABLET    Take 1 tablet (10 mg total) by mouth every 6 (six) hours as needed (Nausea or vomiting).   VALACYCLOVIR (VALTREX) 500 MG TABLET    Take 500 mg by mouth daily.   ZOLPIDEM (AMBIEN) 5 MG TABLET    Take 1 tablet (5 mg total) by mouth at bedtime as needed for sleep.  Modified Medications   No medications on file  Discontinued Medications   TRAZODONE (DESYREL) 50 MG TABLET    Take 0.5-1 tablets (25-50 mg total) by mouth at bedtime as needed for sleep.    Review of Systems  Unable to perform ROS: Other  hallucinating  Filed Vitals:   09/12/15 1357  BP: 118/69  Pulse: 75  Temp: 97.5 F (36.4 C)  TempSrc: Oral  Resp: 16  Weight: 135 lb 4.8 oz (61.372 kg)   Body mass index is 21.85 kg/(m^2).  Physical Exam  Constitutional: She appears cachectic. She appears distressed.  Sitting in w/c in NAD. Frail appearing  HENT:  Mouth/Throat: Oropharynx is clear and moist. No oropharyngeal exudate.  Eyes: Pupils are equal, round, and reactive to light. No scleral icterus.  Neck: Neck supple. Carotid bruit is not present. No tracheal deviation present.  Cardiovascular: Normal rate, regular rhythm, normal heart sounds and intact distal pulses.  Exam reveals no gallop and no friction rub.   No murmur heard. Left arm AVF  with thrill/audible bruit. Trace RLE edema. No calf TTP b/l. Extensive soft left leg varicose veins, o palpable nodules  Pulmonary/Chest: Effort normal and breath sounds normal. No stridor. No respiratory distress. She has no wheezes. She has no rales.  Abdominal: Soft. Bowel sounds are normal. She exhibits no distension and no mass. There is no hepatomegaly. There is no tenderness. There is no rebound and no guarding.  Musculoskeletal: She exhibits edema and tenderness.  Right knee immobilizer intact. Left knee swellign with effusion and reduced ROM.   Lymphadenopathy:    She has no cervical adenopathy.  Neurological: She is alert.  AO x 2 in  NAD  Skin: Skin is warm and dry. No rash noted.  Psychiatric: She has a normal mood and affect. She is actively hallucinating (says her friend is sitting on the bed). Thought content is delusional.     Labs reviewed: Admission on 09/01/2015, Discharged on 09/08/2015  Component Date Value Ref Range Status  . Sodium 09/01/2015 135  135 - 145 mmol/L Final  . Potassium 09/01/2015 4.4  3.5 - 5.1 mmol/L Final  . Chloride 09/01/2015 95* 101 - 111 mmol/L Final  . CO2 09/01/2015 34* 22 - 32 mmol/L Final  . Glucose, Bld 09/01/2015 138* 65 - 99 mg/dL Final  . BUN 09/01/2015 25* 6 - 20 mg/dL Final  . Creatinine, Ser 09/01/2015 5.23* 0.44 - 1.00 mg/dL Final  . Calcium 09/01/2015 9.1  8.9 - 10.3 mg/dL Final  . GFR calc non Af Amer 09/01/2015 7* >60 mL/min Final  . GFR calc Af Amer 09/01/2015 8* >60 mL/min Final   Comment: (NOTE) The eGFR has been calculated using the CKD EPI equation. This calculation has not been validated in all clinical situations. eGFR's persistently <60 mL/min signify possible Chronic Kidney Disease.   . Anion gap 09/01/2015 6  5 - 15 Final  . WBC 09/01/2015 3.8* 4.0 - 10.5 K/uL Final  . RBC 09/01/2015 3.38* 3.87 - 5.11 MIL/uL Final  . Hemoglobin 09/01/2015 10.7* 12.0 - 15.0 g/dL Final  . HCT 09/01/2015 34.8* 36.0 - 46.0 % Final  .  MCV 09/01/2015 103.0* 78.0 - 100.0 fL Final  . MCH 09/01/2015 31.7  26.0 - 34.0 pg Final  . MCHC 09/01/2015 30.7  30.0 - 36.0 g/dL Final  . RDW 09/01/2015 16.5* 11.5 - 15.5 % Final  . Platelets 09/01/2015 162  150 - 400 K/uL Final  . Neutrophils Relative % 09/01/2015 86   Final  . Neutro Abs 09/01/2015 3.3  1.7 - 7.7 K/uL Final  . Lymphocytes Relative 09/01/2015 10   Final  . Lymphs Abs 09/01/2015 0.4* 0.7 - 4.0 K/uL Final  . Monocytes Relative 09/01/2015 3   Final  . Monocytes Absolute 09/01/2015 0.1  0.1 - 1.0 K/uL Final  . Eosinophils Relative 09/01/2015 1   Final  . Eosinophils Absolute 09/01/2015 0.0  0.0 - 0.7 K/uL Final  . Basophils Relative 09/01/2015 0   Final  . Basophils Absolute 09/01/2015 0.0  0.0 - 0.1 K/uL Final  . WBC 09/02/2015 4.2  4.0 - 10.5 K/uL Final  . RBC 09/02/2015 3.06* 3.87 - 5.11 MIL/uL Final  . Hemoglobin 09/02/2015 9.6* 12.0 - 15.0 g/dL Final  . HCT 09/02/2015 31.4* 36.0 - 46.0 % Final  . MCV 09/02/2015 102.6* 78.0 - 100.0 fL Final  . MCH 09/02/2015 31.4  26.0 - 34.0 pg Final  . MCHC 09/02/2015 30.6  30.0 - 36.0 g/dL Final  . RDW 09/02/2015 16.6* 11.5 - 15.5 % Final  . Platelets 09/02/2015 160  150 - 400 K/uL Final  . Sodium 09/02/2015 135  135 - 145 mmol/L Final  . Potassium 09/02/2015 5.2* 3.5 - 5.1 mmol/L Final  . Chloride 09/02/2015 96* 101 - 111 mmol/L Final  . CO2 09/02/2015 30  22 - 32 mmol/L Final  . Glucose, Bld 09/02/2015 95  65 - 99 mg/dL Final  . BUN 09/02/2015 28* 6 - 20 mg/dL Final  . Creatinine, Ser 09/02/2015 5.69* 0.44 - 1.00 mg/dL Final  . Calcium 09/02/2015 8.6* 8.9 - 10.3 mg/dL Final  . GFR calc non Af Amer 09/02/2015 7* >60 mL/min Final  .  GFR calc Af Amer 09/02/2015 8* >60 mL/min Final   Comment: (NOTE) The eGFR has been calculated using the CKD EPI equation. This calculation has not been validated in all clinical situations. eGFR's persistently <60 mL/min signify possible Chronic Kidney Disease.   . Anion gap 09/02/2015 9  5  - 15 Final  . ABO/RH(D) 09/02/2015 O POS   Final  . Antibody Screen 09/02/2015 NEG   Final  . Sample Expiration 09/02/2015 09/05/2015   Final  . WBC 09/03/2015 3.9* 4.0 - 10.5 K/uL Final  . RBC 09/03/2015 2.80* 3.87 - 5.11 MIL/uL Final  . Hemoglobin 09/03/2015 8.7* 12.0 - 15.0 g/dL Final  . HCT 09/03/2015 28.5* 36.0 - 46.0 % Final  . MCV 09/03/2015 101.8* 78.0 - 100.0 fL Final  . MCH 09/03/2015 31.1  26.0 - 34.0 pg Final  . MCHC 09/03/2015 30.5  30.0 - 36.0 g/dL Final  . RDW 09/03/2015 16.5* 11.5 - 15.5 % Final  . Platelets 09/03/2015 167  150 - 400 K/uL Final  . Sodium 09/03/2015 130* 135 - 145 mmol/L Final  . Potassium 09/03/2015 6.2* 3.5 - 5.1 mmol/L Final   Comment: CRITICAL RESULT CALLED TO, READ BACK BY AND VERIFIED WITH: WHITEHORN,S RN 09/03/2015 0628 JORDANS   . Chloride 09/03/2015 94* 101 - 111 mmol/L Final  . CO2 09/03/2015 29  22 - 32 mmol/L Final  . Glucose, Bld 09/03/2015 113* 65 - 99 mg/dL Final  . BUN 09/03/2015 46* 6 - 20 mg/dL Final  . Creatinine, Ser 09/03/2015 7.66* 0.44 - 1.00 mg/dL Final  . Calcium 09/03/2015 8.9  8.9 - 10.3 mg/dL Final  . Phosphorus 09/03/2015 5.6* 2.5 - 4.6 mg/dL Final  . Albumin 09/03/2015 2.6* 3.5 - 5.0 g/dL Final  . GFR calc non Af Amer 09/03/2015 5* >60 mL/min Final  . GFR calc Af Amer 09/03/2015 5* >60 mL/min Final   Comment: (NOTE) The eGFR has been calculated using the CKD EPI equation. This calculation has not been validated in all clinical situations. eGFR's persistently <60 mL/min signify possible Chronic Kidney Disease.   . Anion gap 09/03/2015 7  5 - 15 Final  . Sodium 09/03/2015 130* 135 - 145 mmol/L Final  . Potassium 09/03/2015 4.1  3.5 - 5.1 mmol/L Final   DELTA CHECK NOTED  . Chloride 09/03/2015 91* 101 - 111 mmol/L Final  . CO2 09/03/2015 29  22 - 32 mmol/L Final  . Glucose, Bld 09/03/2015 90  65 - 99 mg/dL Final  . BUN 09/03/2015 30* 6 - 20 mg/dL Final  . Creatinine, Ser 09/03/2015 5.15* 0.44 - 1.00 mg/dL Final  .  Calcium 09/03/2015 8.6* 8.9 - 10.3 mg/dL Final  . GFR calc non Af Amer 09/03/2015 7* >60 mL/min Final  . GFR calc Af Amer 09/03/2015 9* >60 mL/min Final   Comment: (NOTE) The eGFR has been calculated using the CKD EPI equation. This calculation has not been validated in all clinical situations. eGFR's persistently <60 mL/min signify possible Chronic Kidney Disease.   . Anion gap 09/03/2015 10  5 - 15 Final  . Sodium 09/06/2015 123* 135 - 145 mmol/L Final  . Potassium 09/06/2015 5.6* 3.5 - 5.1 mmol/L Final  . Chloride 09/06/2015 88* 101 - 111 mmol/L Final  . CO2 09/06/2015 25  22 - 32 mmol/L Final  . Glucose, Bld 09/06/2015 118* 65 - 99 mg/dL Final  . BUN 09/06/2015 59* 6 - 20 mg/dL Final  . Creatinine, Ser 09/06/2015 8.62* 0.44 - 1.00 mg/dL Final  .  Calcium 09/06/2015 7.9* 8.9 - 10.3 mg/dL Final  . GFR calc non Af Amer 09/06/2015 4* >60 mL/min Final  . GFR calc Af Amer 09/06/2015 5* >60 mL/min Final   Comment: (NOTE) The eGFR has been calculated using the CKD EPI equation. This calculation has not been validated in all clinical situations. eGFR's persistently <60 mL/min signify possible Chronic Kidney Disease.   . Anion gap 09/06/2015 10  5 - 15 Final  . WBC 09/06/2015 2.1* 4.0 - 10.5 K/uL Final  . RBC 09/06/2015 2.28* 3.87 - 5.11 MIL/uL Final  . Hemoglobin 09/06/2015 7.1* 12.0 - 15.0 g/dL Final  . HCT 09/06/2015 22.3* 36.0 - 46.0 % Final  . MCV 09/06/2015 97.8  78.0 - 100.0 fL Final  . MCH 09/06/2015 31.1  26.0 - 34.0 pg Final  . MCHC 09/06/2015 31.8  30.0 - 36.0 g/dL Final  . RDW 09/06/2015 15.3  11.5 - 15.5 % Final  . Platelets 09/06/2015 164  150 - 400 K/uL Final  . Neutrophils Relative % 09/06/2015 71   Final  . Neutro Abs 09/06/2015 1.5* 1.7 - 7.7 K/uL Final  . Lymphocytes Relative 09/06/2015 16   Final  . Lymphs Abs 09/06/2015 0.3* 0.7 - 4.0 K/uL Final  . Monocytes Relative 09/06/2015 9   Final  . Monocytes Absolute 09/06/2015 0.2  0.1 - 1.0 K/uL Final  . Eosinophils  Relative 09/06/2015 3   Final  . Eosinophils Absolute 09/06/2015 0.1  0.0 - 0.7 K/uL Final  . Basophils Relative 09/06/2015 1   Final  . Basophils Absolute 09/06/2015 0.0  0.0 - 0.1 K/uL Final  . ABO/RH(D) 09/06/2015 O POS   Final  . Antibody Screen 09/06/2015 NEG   Final  . Sample Expiration 09/06/2015 09/09/2015   Final  . Unit Number 09/06/2015 [id number]   Final  . Blood Component Type 09/06/2015 RED CELLS,LR   Final  . Unit division 09/06/2015 00   Final  . Status of Unit 09/06/2015 ISSUED,FINAL   Final  . Transfusion Status 09/06/2015 OK TO TRANSFUSE   Final  . Crossmatch Result 09/06/2015 Compatible   Final  . Unit Number 09/06/2015 [id number]   Final  . Blood Component Type 09/06/2015 RBC LR PHER2   Final  . Unit division 09/06/2015 00   Final  . Status of Unit 09/06/2015 ISSUED,FINAL   Final  . Transfusion Status 09/06/2015 OK TO TRANSFUSE   Final  . Crossmatch Result 09/06/2015 Compatible   Final  . Order Confirmation 09/06/2015 ORDER PROCESSED BY BLOOD BANK   Final  . WBC 09/07/2015 1.7* 4.0 - 10.5 K/uL Final  . RBC 09/07/2015 2.86* 3.87 - 5.11 MIL/uL Final  . Hemoglobin 09/07/2015 8.9* 12.0 - 15.0 g/dL Final   POST TRANSFUSION SPECIMEN  . HCT 09/07/2015 27.9* 36.0 - 46.0 % Final  . MCV 09/07/2015 97.6  78.0 - 100.0 fL Final  . MCH 09/07/2015 31.1  26.0 - 34.0 pg Final  . MCHC 09/07/2015 31.9  30.0 - 36.0 g/dL Final  . RDW 09/07/2015 16.8* 11.5 - 15.5 % Final  . Platelets 09/07/2015 155  150 - 400 K/uL Final  . Sodium 09/08/2015 125* 135 - 145 mmol/L Final  . Potassium 09/08/2015 4.8  3.5 - 5.1 mmol/L Final  . Chloride 09/08/2015 90* 101 - 111 mmol/L Final  . CO2 09/08/2015 26  22 - 32 mmol/L Final  . Glucose, Bld 09/08/2015 104* 65 - 99 mg/dL Final  . BUN 09/08/2015 47* 6 - 20 mg/dL Final  . Creatinine,  Ser 09/08/2015 8.22* 0.44 - 1.00 mg/dL Final  . Calcium 09/08/2015 8.1* 8.9 - 10.3 mg/dL Final  . GFR calc non Af Amer 09/08/2015 4* >60 mL/min Final  . GFR  calc Af Amer 09/08/2015 5* >60 mL/min Final   Comment: (NOTE) The eGFR has been calculated using the CKD EPI equation. This calculation has not been validated in all clinical situations. eGFR's persistently <60 mL/min signify possible Chronic Kidney Disease.   . Anion gap 09/08/2015 9  5 - 15 Final  . WBC 09/08/2015 2.2* 4.0 - 10.5 K/uL Final  . RBC 09/08/2015 2.86* 3.87 - 5.11 MIL/uL Final  . Hemoglobin 09/08/2015 8.7* 12.0 - 15.0 g/dL Final  . HCT 09/08/2015 27.3* 36.0 - 46.0 % Final  . MCV 09/08/2015 95.5  78.0 - 100.0 fL Final  . MCH 09/08/2015 30.4  26.0 - 34.0 pg Final  . MCHC 09/08/2015 31.9  30.0 - 36.0 g/dL Final  . RDW 09/08/2015 15.7* 11.5 - 15.5 % Final  . Platelets 09/08/2015 173  150 - 400 K/uL Final    Dg Chest 2 View  09/03/2015  CLINICAL DATA:  Preop chest exam EXAM: CHEST  2 VIEW COMPARISON:  11/26/2014 FINDINGS: Stable cardiomegaly and aortic tortuosity. There is no edema, consolidation, effusion, or pneumothorax. Chronic hyperinflation. IMPRESSION: Stable exam.  No evidence of active cardiopulmonary disease. Electronically Signed   By: Monte Fantasia M.D.   On: 09/03/2015 08:58   Dg Knee 1-2 Views Left  09/03/2015  CLINICAL DATA:  Preoperative planning for ORIF right distal femur fracture. EXAM: LEFT KNEE - 1-2 VIEW COMPARISON:  None. FINDINGS: No fracture, joint effusion, dislocation or suspicious focal osseous lesion. Mild tricompartmental osteoarthritis in the left knee joint. Small to moderate superior and small inferior left patellar enthesophytes. Vascular calcifications throughout the posterior soft tissues. IMPRESSION: Mild tricompartmental osteoarthritis in the left knee joint. Electronically Signed   By: Ilona Sorrel M.D.   On: 09/03/2015 15:20   Ct Knee Right Wo Contrast  09/02/2015  CLINICAL DATA:  Distal femoral fracture EXAM: CT OF THE right KNEE WITHOUT CONTRAST TECHNIQUE: Multidetector CT imaging of the right knee was performed according to the standard  protocol. Multiplanar CT image reconstructions were also generated. COMPARISON:  Radiographs 09/01/2015 FINDINGS: There is a complex severely comminuted distal femoral fracture. There is no evidence of an underlying bone lesion to suggest a pathologic basis of the fracture. There is a large lipohemarthrosis. The fracture includes a transverse component across the diametaphyseal junction and a sagittal fracture through the intercondylar notch. The condyles are mildly rotated anteriorly. There is mild impaction. There is mild splaying of the condyles at the sagittal intercondylar fracture, with mild lateral subluxation of the lateral femoral condylar fragment with respect to the lateral tibial plateau. The medial compartment articular relationships appear maintained. Patella and extensor mechanism appear intact. Proximal tibia, proximal fibula and the proximal tibial fibular articulation appear intact. IMPRESSION: Severely comminuted complex distal femoral fracture. Mild lateral subluxation of the lateral femoral condyle fragment respect to the lateral tibial plateau. Electronically Signed   By: Andreas Newport M.D.   On: 09/02/2015 00:02   Dg Knee Complete 4 Views Right  09/01/2015  CLINICAL DATA:  Status post fall.  Right knee pain and deformity. EXAM: RIGHT KNEE - COMPLETE 4+ VIEW COMPARISON:  None. FINDINGS: There is severe osteopenia. Severely comminuted fracture of the distal femoral metaphysis. There is a vertically oriented fracture cleft involving the lateral femoral condyle. There is anterior displacement. There is no  dislocation. There is a large joint effusion. There is peripheral vascular atherosclerotic disease. IMPRESSION: 1. Severely comminuted fracture of the distal femoral metaphysis with a vertically oriented fracture cleft involving the lateral femoral condyle. Electronically Signed   By: Kathreen Devoid   On: 09/01/2015 22:49   Dg Hip Unilat With Pelvis 2-3 Views Right  09/01/2015  CLINICAL  DATA:  74 year old female with fall and right knee pain. No hip complaint. EXAM: DG HIP (WITH OR WITHOUT PELVIS) 2-3V RIGHT COMPARISON:  Radiograph dated 04/10/2015 FINDINGS: There is no evidence of hip fracture or dislocation. There is degenerative changes of the visualized lower spine. Stable appearing calcific density in the right hemipelvis may represent a partially calcified fibroid or a calcified ovarian lesion. Pelvic ultrasound may provide better evaluation. IMPRESSION: No fracture or dislocation. Electronically Signed   By: Anner Crete M.D.   On: 09/01/2015 22:52   Dg Femur 1v Left  09/03/2015  CLINICAL DATA:  Preoperative planning for ORIF right distal femur fracture. EXAM: LEFT FEMUR 1 VIEW COMPARISON:  None. FINDINGS: No fracture or suspicious focal osseous lesion in the left femur. No evidence of malalignment at the left hip or left knee. Mild osteoarthritis in the medial and lateral compartments of the left knee joint. Vascular calcifications throughout the soft tissues. Left femur measures 55.5 cm using the provided rule or, measuring from the superior margin of the left femoral head to the inferior margin of the medial left femoral condyle. IMPRESSION: No left femur fracture.  Left femur measurements as described. Mild osteoarthritis in the medial and lateral compartments of the left knee joint. Electronically Signed   By: Ilona Sorrel M.D.   On: 09/03/2015 15:55   Dg Femur, Min 2 Views Right  09/03/2015  CLINICAL DATA:  Preoperative planning for ORIF right distal femur fracture EXAM: RIGHT FEMUR 2 VIEWS COMPARISON:  Right knee radiographs from 09/01/2015. FINDINGS: Re- demonstrated is a complex comminuted intra-articular fracture of the distal right femur, with stable 2.8 cm lateral displacement of the lateral condyle fracture fragment and stable impaction of the fracture fragments. No additional fracture. No suspicious focal osseous lesion. No right hip dislocation. No right knee  dislocation. Large lipohemarthrosis in the suprapatellar right knee joint. Underlying osteoarthritis in the lateral and patellofemoral compartments of the right knee joint. Vascular calcifications throughout the soft tissues. IMPRESSION: Re- demonstration of complex comminuted displaced intra-articular right distal femur fracture with large lipohemarthrosis in the suprapatellar right knee joint. Electronically Signed   By: Ilona Sorrel M.D.   On: 09/03/2015 15:19     Assessment/Plan   ICD-9-CM ICD-10-CM   1. Loss of appetite due to #3 and #8 783.0 R63.0   2. Closed fracture of distal end of right femur, unspecified fracture morphology, initial encounter ([place]) - conservative tx; s/p fall 821.20 S72.401A   3. Multiple myeloma not having achieved remission ([facility])  203.00 C90.00   4. Protein-calorie malnutrition, severe ([place]) - stable 262 E43   5. ESRD on dialysis ([place]) on TThSa via left arm AVF 585.6 N18.6    V45.11 Z99.2   6. Anemia in neoplastic disease -stable 285.22 D63.0   7. Leukopenia due to antineoplastic chemotherapy - stable 288.50 D72.819    E933.1 T45.1X5A   8. Gastroesophageal reflux disease without esophagitis 530.81 K21.9   9. Fall, initial encounter  [phone] W19.XXXA     Start remeron 26m po qhs to stimulate appetite  Cont current meds as ordered  PT/OT as ordered  ST as indicated  F/u  with HD TThSa as scheduled. Renal diet  GOAL: short term rehab and d/c home when medically appropriate. Poor prognosis due to terminal illness. Communicated with pt and nursing.  Will follow  Prapti Grussing S. Perlie Gold  [facility] [address] [place], [place] [address] [phone] Cell (Monday-Friday 8 AM - 5 PM) [phone] After 5 PM and follow prompts

## 2015-10-21 ENCOUNTER — Non-Acute Institutional Stay (SKILLED_NURSING_FACILITY): Payer: Medicare Other | Admitting: Nurse Practitioner

## 2015-10-21 DIAGNOSIS — S72401D Unspecified fracture of lower end of right femur, subsequent encounter for closed fracture with routine healing: Secondary | ICD-10-CM

## 2015-10-21 DIAGNOSIS — K219 Gastro-esophageal reflux disease without esophagitis: Secondary | ICD-10-CM | POA: Diagnosis not present

## 2015-10-21 DIAGNOSIS — Z992 Dependence on renal dialysis: Secondary | ICD-10-CM | POA: Diagnosis not present

## 2015-10-21 DIAGNOSIS — R630 Anorexia: Secondary | ICD-10-CM

## 2015-10-21 DIAGNOSIS — N186 End stage renal disease: Secondary | ICD-10-CM | POA: Diagnosis not present

## 2015-10-21 DIAGNOSIS — I120 Hypertensive chronic kidney disease with stage 5 chronic kidney disease or end stage renal disease: Secondary | ICD-10-CM | POA: Diagnosis not present

## 2015-10-21 NOTE — Progress Notes (Signed)
Patient ID: [name], female   DOB: Apr 13, 1941, 75 y.o.   MRN: [id number]    Nursing Home Location:  [facility]   Place of Service: SNF (37)  PCP: Benito Mccreedy, MD  No Known Allergies  Chief Complaint  Patient presents with  . Medical Management of Chronic Issues    HPI:  Patient is a 75 y.o. female seen today at [facility] for routine follow up. 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 pt with recent hospitalization due to femur fx. Pt without any complaints at this time. Reports pain is controlled. conts on dialysis. Pt was started on remeron last month and pt reports good appetite. Nursing without concerns at this time.  Review of Systems:  Review of Systems  Constitutional: Negative for activity change, appetite change, fatigue and unexpected weight change.  HENT: Negative for congestion and hearing loss.   Eyes: Negative.   Respiratory: Negative for cough and shortness of breath.   Cardiovascular: Negative for chest pain, palpitations and leg swelling.  Gastrointestinal: Negative for abdominal pain, diarrhea and constipation.  Genitourinary: Negative for dysuria.  Musculoskeletal: Negative for myalgias and arthralgias.  Skin: Negative for color change and wound.  Neurological: Negative for dizziness and weakness.  Psychiatric/Behavioral: Negative for behavioral problems, confusion and agitation.    No past medical history on file. Past Surgical History  Procedure Laterality Date  . Abdominal hysterectomy      Pt. denies   Social History:   reports that she has never smoked. She has never used smokeless tobacco. She reports that she does not drink alcohol or use illicit drugs.  Family History  Problem Relation Age of Onset  . Hypertension Mother   . Hypertension Father     Medications: Patient's Medications  New Prescriptions   No  medications on file  Previous Medications   AMLODIPINE (NORVASC) 10 MG TABLET    Take 0.5 tablets (5 mg total) by mouth daily.   BISACODYL (DULCOLAX) 10 MG SUPPOSITORY    Place 1 suppository (10 mg total) rectally daily as needed for moderate constipation.   CINACALCET (SENSIPAR) 30 MG TABLET    Take 30 mg by mouth daily.   DARBEPOETIN ALFA (ARANESP) 150 MCG/0.3ML SOSY INJECTION    Inject 0.3 mLs (150 mcg total) into the vein every Thursday with hemodialysis.   DOXERCALCIFEROL (HECTOROL) 4 MCG/2ML INJECTION    Inject 2 mLs (4 mcg total) into the vein Every Tuesday,Thursday,and Saturday with dialysis.   FERRIC GLUCONATE 125 MG IN SODIUM CHLORIDE 0.9 % 100 ML    Inject 125 mg into the vein every Thursday with hemodialysis.   FUROSEMIDE (LASIX) 20 MG TABLET    Take 1 tablet (20 mg total) by mouth daily. PRN for leg edema   HYDRALAZINE (APRESOLINE) 10 MG TABLET    Take 1 tablet (10 mg total) by mouth 3 (three) times daily.   HYDROCODONE-ACETAMINOPHEN (NORCO/VICODIN) 5-325 MG TABLET    Take 1-2 tablets by mouth every 6 (six) hours as needed for moderate pain.   ISOSORBIDE MONONITRATE (IMDUR) 60 MG 24 HR TABLET    Take 60 mg by mouth daily.   MULTIVITAMIN (RENA-VIT) TABS TABLET    Take 1 tablet by mouth at bedtime.   NUTRITIONAL SUPPLEMENTS (FEEDING SUPPLEMENT, NEPRO CARB STEADY,) LIQD    Take 237 mLs by mouth 2 (two) times daily between meals.   ONDANSETRON (ZOFRAN ODT) 4 MG  DISINTEGRATING TABLET    Take 1 tablet (4 mg total) by mouth every 8 (eight) hours as needed for nausea or vomiting.   PANTOPRAZOLE (PROTONIX) 40 MG TABLET    TAKE 1 TABLET BY MOUTH DAILY   PROCHLORPERAZINE (COMPAZINE) 10 MG TABLET    Take 1 tablet (10 mg total) by mouth every 6 (six) hours as needed (Nausea or vomiting).   VALACYCLOVIR (VALTREX) 500 MG TABLET    Take 500 mg by mouth daily.   ZOLPIDEM (AMBIEN) 5 MG TABLET    Take 1 tablet (5 mg total) by mouth at bedtime as needed for sleep.  Modified Medications   No medications  on file  Discontinued Medications   No medications on file     Physical Exam: Filed Vitals:   10/21/15 1157  BP: 132/75  Pulse: 78  Temp: 98.3 F (36.8 C)  Resp: 20  Weight: 134 lb (60.782 kg)    Physical Exam  Constitutional:  Frail appearing  HENT:  Mouth/Throat: Oropharynx is clear and moist. No oropharyngeal exudate.  Eyes: Pupils are equal, round, and reactive to light. No scleral icterus.  Neck: Neck supple. Carotid bruit is not present.  Cardiovascular: Normal rate, regular rhythm and normal heart sounds.   Left arm AVF with thrill/audible bruit.  Pulmonary/Chest: Effort normal and breath sounds normal.  Abdominal: Soft. Bowel sounds are normal. There is no hepatomegaly.  Musculoskeletal: She exhibits tenderness.  Right knee immobilizer intact.  Neurological: She is alert.  AO x 2 in NAD  Skin: Skin is warm and dry. No rash noted.  Psychiatric: She has a normal mood and affect.    Labs reviewed: Basic Metabolic Panel:  Recent Labs  04/10/15 2114 04/12/15 1104  09/03/15 0444 09/03/15 2154 09/06/15 0915 09/08/15 1431  NA 129* 130*  < > 130* 130* 123* 125*  K 6.0* 4.1  < > 6.2* 4.1 5.6* 4.8  CL 91* 93*  < > 94* 91* 88* 90*  CO2 25 27  < > 29 29 25 26   GLUCOSE 69 105*  < > 113* 90 118* 104*  BUN 64* 44*  < > 46* 30* 59* 47*  CREATININE 11.54* 9.05*  < > 7.66* 5.15* 8.62* 8.22*  CALCIUM 10.4* 10.4*  < > 8.9 8.6* 7.9* 8.1*  PHOS 8.7* 7.6*  --  5.6*  --   --   --   < > = values in this interval not displayed. Liver Function Tests:  Recent Labs  02/04/15 0945 02/25/15 1002 04/10/15 1629 04/10/15 2114 04/12/15 1104 09/03/15 0444  AST 13 16 36  --   --   --   ALT 7 10 13*  --   --   --   ALKPHOS 50 55 42  --   --   --   BILITOT 0.46 0.52 1.2  --   --   --   PROT 7.8 8.6* 10.3*  --   --   --   ALBUMIN 3.1* 3.1* 3.4* 3.5 2.7* 2.6*    Recent Labs  01/21/15 0032  LIPASE 68*   No results for input(s): AMMONIA in the last 8760  hours. CBC:  Recent Labs  04/10/15 1629  09/01/15 2312  09/06/15 0915 09/07/15 1029 09/08/15 1431  WBC 5.7  < > 3.8*  < > 2.1* 1.7* 2.2*  NEUTROABS 4.1  --  3.3  --  1.5*  --   --   HGB 14.4  < > 10.7*  < > 7.1* 8.9* 8.7*  HCT 44.4  < > 34.8*  < > 22.3* 27.9* 27.3*  MCV 96.9  < > 103.0*  < > 97.8 97.6 95.5  PLT 201  < > 162  < > 164 155 173  < > = values in this interval not displayed. TSH: No results for input(s): TSH in the last 8760 hours. A1C: No results found for: HGBA1C Lipid Panel: No results for input(s): CHOL, HDL, LDLCALC, TRIG, CHOLHDL, LDLDIRECT in the last 8760 hours.   Assessment/Plan 1. Hypertension associatd with end stage renal disease on dialysis Blood pressure stable, remains on hydralazine and norvasc  2. ESRD (end stage renal disease) ([place]) conts HD on Tuesday, Thursday, Saturday.   3. Closed fracture of distal end of right femur with routine healing, unspecified fracture morphology, subsequent encounter Pain well controlled, ongoing follow up with ortho  4. Gastroesophageal reflux disease without esophagitis Stable on protonix   5. Loss of appetite Improved, conts on remeron, weight has been stable.      [name]. Harle Battiest  [facility] [phone] 8 am - 5 pm) [phone] (after hours)

## 2015-11-22 ENCOUNTER — Other Ambulatory Visit: Payer: Self-pay

## 2015-11-22 MED ORDER — HYDROCODONE-ACETAMINOPHEN 5-325 MG PO TABS: ORAL_TABLET | ORAL | Status: DC

## 2015-11-22 NOTE — Telephone Encounter (Signed)
Rx Fax to [employer] at [phone], phone number [phone].

## 2015-11-23 ENCOUNTER — Encounter: Payer: Self-pay | Admitting: Nurse Practitioner

## 2015-11-23 ENCOUNTER — Non-Acute Institutional Stay (SKILLED_NURSING_FACILITY): Payer: Medicare Other | Admitting: Nurse Practitioner

## 2015-11-23 DIAGNOSIS — N186 End stage renal disease: Secondary | ICD-10-CM | POA: Diagnosis not present

## 2015-11-23 DIAGNOSIS — R630 Anorexia: Secondary | ICD-10-CM

## 2015-11-23 DIAGNOSIS — I120 Hypertensive chronic kidney disease with stage 5 chronic kidney disease or end stage renal disease: Secondary | ICD-10-CM

## 2015-11-23 DIAGNOSIS — C9000 Multiple myeloma not having achieved remission: Secondary | ICD-10-CM | POA: Diagnosis not present

## 2015-11-23 DIAGNOSIS — Z992 Dependence on renal dialysis: Secondary | ICD-10-CM

## 2015-11-23 DIAGNOSIS — K219 Gastro-esophageal reflux disease without esophagitis: Secondary | ICD-10-CM | POA: Diagnosis not present

## 2015-11-23 DIAGNOSIS — G4700 Insomnia, unspecified: Secondary | ICD-10-CM | POA: Diagnosis not present

## 2015-11-23 NOTE — Progress Notes (Signed)
Patient ID: [name], female   DOB: [birth year], 75 y.o.   MRN: [id number]    Nursing Home Location:  [facility]   Place of Service: SNF (84)  PCP: Benito Mccreedy, MD  No Known Allergies  Chief Complaint  Patient presents with  . Medical Management of Chronic Issues    Routine Visit    HPI:  Patient is a 75 y.o. female seen today at [facility] for routine follow up. 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 Pt conts to go to dialysis Tuesday, thurs, Saturday. Pt denies constipation, GERD, shortness of breath or chest pains. conts on remeron for appetite. Weight is down in the last month.  Nursing reports occasional behaviors. Pt without any complaints at this time.   Review of Systems:  Review of Systems  Constitutional: Negative for activity change, appetite change, fatigue and unexpected weight change.  HENT: Negative for congestion and hearing loss.   Eyes: Negative.   Respiratory: Negative for cough and shortness of breath.   Cardiovascular: Negative for chest pain, palpitations and leg swelling.  Gastrointestinal: Negative for abdominal pain, diarrhea and constipation.  Genitourinary: Negative for dysuria.  Musculoskeletal: Negative for myalgias and arthralgias.  Skin: Negative for color change and wound.  Neurological: Negative for dizziness and weakness.  Psychiatric/Behavioral: Negative for behavioral problems, confusion and agitation.    History reviewed. No pertinent past medical history. Past Surgical History  Procedure Laterality Date  . Abdominal hysterectomy      Pt. denies   Social History:   reports that she has never smoked. She has never used smokeless tobacco. She reports that she does not drink alcohol or use illicit drugs.  Family History    Problem Relation Age of Onset  . Hypertension Mother   . Hypertension Father     Medications: Patient's Medications  New Prescriptions   No medications on file  Previous Medications   AMINO ACIDS-PROTEIN HYDROLYS (FEEDING SUPPLEMENT, PRO-STAT SUGAR FREE 64,) LIQD    Take 30 mLs by mouth 2 (two) times daily.   AMLODIPINE (NORVASC) 10 MG TABLET    Take 0.5 tablets (5 mg total) by mouth daily.   BISACODYL (DULCOLAX) 10 MG SUPPOSITORY    Place 1 suppository (10 mg total) rectally daily as needed for moderate constipation.   CINACALCET (SENSIPAR) 60 MG TABLET    Take 60 mg by mouth daily.   FUROSEMIDE (LASIX) 20 MG TABLET    Take 1 tablet (20 mg total) by mouth daily. PRN for leg edema   HYDRALAZINE (APRESOLINE) 10 MG TABLET    Take 1 tablet (10 mg total) by mouth 3 (three) times daily.   HYDROCODONE-ACETAMINOPHEN (NORCO/VICODIN) 5-325 MG TABLET    Take 1 tablet by mouth every 6 hours as needed for moderate pain control. Take 2 tablets by mouth every 6 hours as needed for severe pain control.   ISOSORBIDE MONONITRATE (IMDUR) 60 MG 24 HR TABLET    Take 60 mg by mouth daily.   LANTHANUM (FOSRENOL) 1000 MG CHEWABLE TABLET    Take 2 tablets (=2018m) by mouth AC.   MIRTAZAPINE (REMERON SOL-TAB) 15 MG DISINTEGRATING TABLET    Dissolve 1 tablet by mouth at bedtime for appetite.   MULTIVITAMIN (RENA-VIT) TABS TABLET    Take 1 tablet by mouth at bedtime.   NUTRITIONAL SUPPLEMENTS (  FEEDING SUPPLEMENT, NEPRO CARB STEADY,) LIQD    Take 237 mLs by mouth 2 (two) times daily between meals.   ONDANSETRON (ZOFRAN ODT) 4 MG DISINTEGRATING TABLET    Take 1 tablet (4 mg total) by mouth every 8 (eight) hours as needed for nausea or vomiting.   PANTOPRAZOLE (PROTONIX) 40 MG TABLET    TAKE 1 TABLET BY MOUTH DAILY   PROCHLORPERAZINE (COMPAZINE) 10 MG TABLET    Take 1 tablet (10 mg total) by mouth every 6 (six) hours as needed (Nausea or vomiting).   RANITIDINE (ZANTAC) 300 MG TABLET    Take 300 mg by mouth at  bedtime.   SIMETHICONE (MYLICON) 622 MG CHEWABLE TABLET    Take 1 tablet by mouth prior to each meal.   VALACYCLOVIR (VALTREX) 500 MG TABLET    Take 500 mg by mouth daily.   ZOLPIDEM (AMBIEN) 5 MG TABLET    Take 1 tablet (5 mg total) by mouth at bedtime as needed for sleep.  Modified Medications   No medications on file  Discontinued Medications   No medications on file     Physical Exam: Filed Vitals:   11/23/15 1415  BP: 129/78  Pulse: 82  Temp: 98.8 F (37.1 C)  TempSrc: Oral  Resp: 20  Height: 5' 6" (1.676 m)  Weight: 129 lb 4.8 oz (58.65 kg)    Physical Exam  Constitutional:  Frail appearing  HENT:  Mouth/Throat: Oropharynx is clear and moist. No oropharyngeal exudate.  Eyes: Pupils are equal, round, and reactive to light. No scleral icterus.  Neck: Neck supple. Carotid bruit is not present.  Cardiovascular: Normal rate, regular rhythm and normal heart sounds.   Left arm AVF with thrill/audible bruit.  Pulmonary/Chest: Effort normal and breath sounds normal.  Abdominal: Soft. Bowel sounds are normal. There is no hepatomegaly.  Musculoskeletal: She exhibits no tenderness.  Neurological: She is alert.  AO x 2 in NAD  Skin: Skin is warm and dry. No rash noted.  Psychiatric: She has a normal mood and affect.    Labs reviewed: Basic Metabolic Panel:  Recent Labs  04/10/15 2114 04/12/15 1104  09/03/15 0444 09/03/15 2154 09/06/15 0915 09/08/15 1431  NA 129* 130*  < > 130* 130* 123* 125*  K 6.0* 4.1  < > 6.2* 4.1 5.6* 4.8  CL 91* 93*  < > 94* 91* 88* 90*  CO2 25 27  < > _0 GLUCOSE 69 105*  < > 113* 90 118* 104*  BUN 64* 44*  < > 46* 30* 59* 47*  CREATININE 11.54* 9.05*  < > 7.66* 5.15* 8.62* 8.22*  CALCIUM 10.4* 10.4*  < > 8.9 8.6* 7.9* 8.1*  PHOS 8.7* 7.6*  --  5.6*  --   --   --   < > = values in this interval not displayed. Liver Function Tests:  Recent Labs  02/04/15 0945 02/25/15 1002 04/10/15 1629 04/10/15 2114 04/12/15 1104  09/03/15 0444  AST 13 16 36  --   --   --   ALT 7 10 13*  --   --   --   ALKPHOS 50 55 42  --   --   --   BILITOT 0.46 0.52 1.2  --   --   --   PROT 7.8 8.6* 10.3*  --   --   --   ALBUMIN 3.1* 3.1* 3.4* 3.5 2.7* 2.6*    Recent Labs  01/21/15 0032  LIPASE 68*  No results for input(s): AMMONIA in the last 8760 hours. CBC:  Recent Labs  04/10/15 1629  09/01/15 2312  09/06/15 0915 09/07/15 1029 09/08/15 1431  WBC 5.7  < > 3.8*  < > 2.1* 1.7* 2.2*  NEUTROABS 4.1  --  3.3  --  1.5*  --   --   HGB 14.4  < > 10.7*  < > 7.1* 8.9* 8.7*  HCT 44.4  < > 34.8*  < > 22.3* 27.9* 27.3*  MCV 96.9  < > 103.0*  < > 97.8 97.6 95.5  PLT 201  < > 162  < > 164 155 173  < > = values in this interval not displayed. TSH: No results for input(s): TSH in the last 8760 hours. A1C: No results found for: HGBA1C Lipid Panel: No results for input(s): CHOL, HDL, LDLCALC, TRIG, CHOLHDL, LDLDIRECT in the last 8760 hours.   Assessment/Plan 1. Hypertension associatd with end stage renal disease on dialysis Blood pressure stable on current regimen. conts on norvasc, hydralazine and imdur  2. ESRD (end stage renal disease) ([place]) conts on dialysis, conts on sensipar and fosrenol   3. Multiple myeloma not having achieved remission ([place]) Ongoing follow up by oncology  4. Gastroesophageal reflux disease without esophagitis -denies worsening GERD, conts on protonix and ranitidine   5. Loss of appetite -ongoing weight loss, conts on remeron and nutritional supplements. Followed by RD  6. Insomnia Stable on Ambien and remeron        Jessica K. Harle Battiest  [facility] [phone] 8 am - 5 pm) [phone] (after hours)

## 2015-12-01 ENCOUNTER — Other Ambulatory Visit: Payer: Self-pay

## 2015-12-01 DIAGNOSIS — G4700 Insomnia, unspecified: Secondary | ICD-10-CM

## 2015-12-01 MED ORDER — ZOLPIDEM TARTRATE 5 MG PO TABS: 5.0000 mg | ORAL_TABLET | Freq: Every evening | ORAL | Status: DC | PRN

## 2015-12-01 NOTE — Telephone Encounter (Signed)
Refill request for zolpidem 5 mg tablets.  Prescription printed and placed in Dr. Cyndi Lennert folder for signing.

## 2015-12-29 ENCOUNTER — Encounter: Payer: Self-pay | Admitting: Adult Health

## 2015-12-29 ENCOUNTER — Non-Acute Institutional Stay (SKILLED_NURSING_FACILITY): Payer: Medicare Other | Admitting: Adult Health

## 2015-12-29 DIAGNOSIS — K219 Gastro-esophageal reflux disease without esophagitis: Secondary | ICD-10-CM

## 2015-12-29 DIAGNOSIS — I120 Hypertensive chronic kidney disease with stage 5 chronic kidney disease or end stage renal disease: Secondary | ICD-10-CM | POA: Diagnosis not present

## 2015-12-29 DIAGNOSIS — N186 End stage renal disease: Secondary | ICD-10-CM | POA: Diagnosis not present

## 2015-12-29 DIAGNOSIS — Z992 Dependence on renal dialysis: Secondary | ICD-10-CM | POA: Diagnosis not present

## 2015-12-29 DIAGNOSIS — C9000 Multiple myeloma not having achieved remission: Secondary | ICD-10-CM

## 2015-12-29 NOTE — Progress Notes (Signed)
Patient ID: [name], female   DOB: 1941/04/20, 75 y.o.   MRN: [id number]   Facility: [facility]       No Known Allergies  Chief Complaint  Patient presents with  . Medical Management of Chronic Issues    Follow up    HPI:  She is a long term resident of this facility being seen for the management of her chronic illnesses. Overall her status is without significant change. She is not voicing any complaints. There are no nursing concerns at this time.     Past Medical History  Diagnosis Date  . ESRD (end stage renal disease) ([place]) 03/11/2014  . Hypertension associatd with end stage renal disease on dialysis 03/11/2014  . GERD (gastroesophageal reflux disease) 04/23/2015  . Closed fracture of right distal femur ([place]) 09/01/2015  . Multiple myeloma ([place]) 03/11/2014  . Malnutrition of moderate degree 09/02/2015     Past Surgical History  Procedure Laterality Date  . Abdominal hysterectomy      Pt. denies    VITAL SIGNS BP 124/78 mmHg  Pulse 76  Resp 18  Ht '5\' 6"'  (1.676 m)  Wt 127 lb 8 oz (57.834 kg)  BMI 20.59 kg/m2  Patient's Medications  New Prescriptions   No medications on file  Previous Medications   AMINO ACIDS-PROTEIN HYDROLYS (FEEDING SUPPLEMENT, PRO-STAT SUGAR FREE 64,) LIQD    Take 30 mLs by mouth 2 (two) times daily.   AMLODIPINE (NORVASC) 10 MG TABLET    Take 0.5 tablets (5 mg total) by mouth daily.   BISACODYL (DULCOLAX) 10 MG SUPPOSITORY    Place 1 suppository (10 mg total) rectally daily as needed for moderate constipation.   CINACALCET (SENSIPAR) 60 MG TABLET    Take 60 mg by mouth daily.   FUROSEMIDE (LASIX) 20 MG TABLET    Take 1 tablet (20 mg total) by mouth daily. PRN for leg edema   HYDRALAZINE (APRESOLINE) 10 MG TABLET    Take 1 tablet (10 mg total) by mouth 3 (three) times daily.   HYDROCODONE-ACETAMINOPHEN (NORCO/VICODIN) 5-325 MG TABLET    Take 1 tablet by mouth every 6 hours as needed for moderate pain control. Take 2 tablets by mouth every 6  hours as needed for severe pain control.   ISOSORBIDE MONONITRATE (IMDUR) 60 MG 24 HR TABLET    Take 60 mg by mouth daily.   LANTHANUM (FOSRENOL) 1000 MG CHEWABLE TABLET    Take 2 tablets (=2068m) by mouth AC.   MIRTAZAPINE (REMERON SOL-TAB) 15 MG DISINTEGRATING TABLET    Dissolve 1 tablet by mouth at bedtime for appetite.   MULTIVITAMIN (RENA-VIT) TABS TABLET    Take 1 tablet by mouth at bedtime.   ONDANSETRON (ZOFRAN ODT) 4 MG DISINTEGRATING TABLET    Take 1 tablet (4 mg total) by mouth every 8 (eight) hours as needed for nausea or vomiting.   PANTOPRAZOLE (PROTONIX) 40 MG TABLET    TAKE 1 TABLET BY MOUTH DAILY   PROCHLORPERAZINE (COMPAZINE) 10 MG TABLET    Take 1 tablet (10 mg total) by mouth every 6 (six) hours as needed (Nausea or vomiting).   RANITIDINE (ZANTAC) 300 MG TABLET    Take 300 mg by mouth at bedtime.   SIMETHICONE (MYLICON) 1767MG CHEWABLE TABLET    Take 1 tablet by mouth prior to each meal.   VALACYCLOVIR (VALTREX) 500 MG TABLET    Take 500 mg by mouth daily.   ZOLPIDEM (AMBIEN) 5 MG TABLET    Take 1 tablet (  5 mg total) by mouth at bedtime as needed for sleep.  Modified Medications   No medications on file  Discontinued Medications     SIGNIFICANT DIAGNOSTIC EXAMS   LABS REVIEWED:   09-08-15: wbc 2.2; hgb 8.7; hct 27.3; mcv 95.5 ;plt 173; glucose 104; bun 47; creat 8.22; k+ 4.8; na++125   Review of Systems  Constitutional: Negative for malaise/fatigue.  Respiratory: Negative for cough and shortness of breath.   Cardiovascular: Negative for chest pain, palpitations and leg swelling.  Gastrointestinal: Negative for heartburn, abdominal pain and constipation.  Musculoskeletal: Negative for myalgias, back pain and joint pain.  Skin: Negative.   Neurological: Negative for dizziness.  Psychiatric/Behavioral: The patient is not nervous/anxious.      Physical Exam  Constitutional: No distress.  Frail   Eyes: Conjunctivae are normal.  Neck: Neck supple. No JVD  present. No thyromegaly present.  Cardiovascular: Normal rate, regular rhythm and intact distal pulses.   Respiratory: Effort normal and breath sounds normal. No respiratory distress. She has no wheezes.  GI: Soft. Bowel sounds are normal. She exhibits no distension. There is no tenderness.  Musculoskeletal: She exhibits no edema.  Reduced range of motion in left lower extremity Right knee splint Right lower trace edema Left lower extremity with varicose veins    Lymphadenopathy:    She has no cervical adenopathy.  Neurological: She is alert.  Skin: Skin is warm and dry. She is not diaphoretic.  Left upper arm A/V shunt: +thrill + bruit   Psychiatric: She has a normal mood and affect.       ASSESSMENT/ PLAN:  1. ESRD: will continue hemodialysis three days per week; is followed by nephrology; will continue renavite daily; will continue fosrenal 2 gm prior to meals.   2. Hyperparathyroidism: will continue sensipar 60 mg daily   3. Hypertension: will continue norvasc 5 mg daily apresoline 10 mg three times daily imdur 60 mg daily   4. Gerd: will continue protonix 40 mg daily and mylicon 156 mg with meals take zantac 300 mg nightly   5. Multiple myeloma: is presently not on active therapy. Is taking remeron 15 mg nightly to help with her appetite. Has vicodin 5/325 mg 1 or 2 tabs every 6 hours as needed.      Ok Edwards NP [facility]  Contact [phone] Monday through Friday 8am- 5pm  After hours call [phone]

## 2016-01-17 ENCOUNTER — Encounter: Payer: Self-pay | Admitting: Adult Health

## 2016-01-30 ENCOUNTER — Non-Acute Institutional Stay (SKILLED_NURSING_FACILITY): Payer: Medicare Other | Admitting: Internal Medicine

## 2016-01-30 ENCOUNTER — Encounter: Payer: Self-pay | Admitting: Internal Medicine

## 2016-01-30 DIAGNOSIS — E43 Unspecified severe protein-calorie malnutrition: Secondary | ICD-10-CM | POA: Diagnosis not present

## 2016-01-30 DIAGNOSIS — N186 End stage renal disease: Secondary | ICD-10-CM

## 2016-01-30 DIAGNOSIS — C9000 Multiple myeloma not having achieved remission: Secondary | ICD-10-CM

## 2016-01-30 DIAGNOSIS — Z992 Dependence on renal dialysis: Secondary | ICD-10-CM | POA: Diagnosis not present

## 2016-01-30 DIAGNOSIS — T451X5A Adverse effect of antineoplastic and immunosuppressive drugs, initial encounter: Secondary | ICD-10-CM

## 2016-01-30 DIAGNOSIS — K219 Gastro-esophageal reflux disease without esophagitis: Secondary | ICD-10-CM

## 2016-01-30 DIAGNOSIS — D72819 Decreased white blood cell count, unspecified: Secondary | ICD-10-CM | POA: Diagnosis not present

## 2016-01-30 DIAGNOSIS — D630 Anemia in neoplastic disease: Secondary | ICD-10-CM | POA: Diagnosis not present

## 2016-01-30 DIAGNOSIS — I120 Hypertensive chronic kidney disease with stage 5 chronic kidney disease or end stage renal disease: Secondary | ICD-10-CM | POA: Diagnosis not present

## 2016-01-30 DIAGNOSIS — D701 Agranulocytosis secondary to cancer chemotherapy: Secondary | ICD-10-CM

## 2016-01-31 NOTE — Progress Notes (Signed)
Patient ID: [name], female   DOB: 05/25/1941, 75 y.o.   MRN: [id number]    DATE:01/30/16  Location:  [facility] Room Number: 114 A Place of Service: SNF (31)   Extended Emergency Contact Information Primary Emergency Contact: Wilburn Mylar [place] Phone: [phone] Mobile Phone: [phone] Relation: Daughter Secondary Emergency Contact: Odie Sera [place] of [place] Mobile Phone: [phone] Relation: Brother  Advanced Directive information Does patient have an advance directive?: No, Would patient like information on creating an advanced directive?: No - patient declined information  Chief Complaint  Patient presents with  . Medical Management of Chronic Issues    Routine Visit    HPI:  75 yo female long term resident seen today for f/u. She has no concerns today. No falls. No nursing issues  Insomnia - stable on ambien  ESRD on HD - via left arm AVF on TThSa. followed by nephrology; takes renavite daily; fosrenal 2 gm prior to meals and for Hyperparathyroidism she takes sensipar 60 mg daily   Hypertension - BP stable on norvasc 5 mg daily; apresoline 10 mg three times daily; imdur 60 mg daily   GERD - stable on protonix 40 mg daily and mylicon 758 mg with meals; zantac 300 mg nightly   Multiple myeloma - presently not on active therapy. Is taking remeron 15 mg nightly to help with her appetite. Has vicodin 5/325 mg 1 or 2 tabs every 6 hours as needed. Followed by oncology. Recent ANC 1.6K; platelets 151K; WBC 2.6K. Hgb 8.7   Past Medical History  Diagnosis Date  . ESRD (end stage renal disease) ([place]) 03/11/2014  . Hypertension associatd with end stage renal disease on dialysis 03/11/2014  . GERD (gastroesophageal reflux disease) 04/23/2015  . Closed fracture of right distal femur ([place]) 09/01/2015  . Multiple myeloma ([place]) 03/11/2014  . Malnutrition of moderate degree 09/02/2015    Past Surgical History    Procedure Laterality Date  . Abdominal hysterectomy      Pt. denies    Patient Care Team: Benito Mccreedy, MD as PCP - General (Internal Medicine)  Social History   Social History  . Marital Status: Married    Spouse Name: N/A  . Number of Children: 3  . Years of Education: N/A   Occupational History  . Retired      Pharmacist, hospital asst.   Social History Main Topics  . Smoking status: Never Smoker   . Smokeless tobacco: Never Used  . Alcohol Use: No  . Drug Use: No  . Sexual Activity: No   Other Topics Concern  . Not on file   Social History Narrative   Patient recently moved to [place] from [place] in May 2015.   [name] was born in [place], [place].   Patient has brother in [place] area.     reports that she has never smoked. She has never used smokeless tobacco. She reports that she does not drink alcohol or use illicit drugs.  Immunization History  Administered Date(s) Administered  . Influenza,inj,Quad PF,36+ Mos 07/23/2014  . PPD Test 04/13/2015    No Known Allergies  Medications: Patient's Medications  New Prescriptions   No medications on file  Previous Medications   AMINO ACIDS-PROTEIN HYDROLYS (FEEDING SUPPLEMENT, PRO-STAT SUGAR FREE 64,) LIQD    Take 30 mLs by mouth 2 (two) times daily.   AMLODIPINE (NORVASC) 10 MG TABLET    Take 0.5 tablets (5 mg total) by mouth daily.  BISACODYL (DULCOLAX) 10 MG SUPPOSITORY    Place 1 suppository (10 mg total) rectally daily as needed for moderate constipation.   CINACALCET (SENSIPAR) 60 MG TABLET    Take 60 mg by mouth daily.   FUROSEMIDE (LASIX) 20 MG TABLET    Take 1 tablet (20 mg total) by mouth daily. PRN for leg edema   HYDRALAZINE (APRESOLINE) 10 MG TABLET    Take 1 tablet (10 mg total) by mouth 3 (three) times daily.   HYDROCODONE-ACETAMINOPHEN (NORCO/VICODIN) 5-325 MG TABLET    Take 1 tablet by mouth every 6 hours as needed for moderate pain control. Take 2 tablets by mouth every 6 hours as needed for  severe pain control.   ISOSORBIDE MONONITRATE (IMDUR) 60 MG 24 HR TABLET    Take 60 mg by mouth daily.   LANTHANUM (FOSRENOL) 1000 MG CHEWABLE TABLET    Take 2 tablets (=2050m) by mouth AC.   MIRTAZAPINE (REMERON SOL-TAB) 15 MG DISINTEGRATING TABLET    Dissolve 1 tablet by mouth at bedtime for appetite.   MULTIVITAMIN (RENA-VIT) TABS TABLET    Take 1 tablet by mouth at bedtime.   ONDANSETRON (ZOFRAN ODT) 4 MG DISINTEGRATING TABLET    Take 1 tablet (4 mg total) by mouth every 8 (eight) hours as needed for nausea or vomiting.   PANTOPRAZOLE (PROTONIX) 40 MG TABLET    TAKE 1 TABLET BY MOUTH DAILY   PROCHLORPERAZINE (COMPAZINE) 10 MG TABLET    Take 1 tablet (10 mg total) by mouth every 6 (six) hours as needed (Nausea or vomiting).   RANITIDINE (ZANTAC) 300 MG TABLET    Take 300 mg by mouth at bedtime.   SIMETHICONE (MYLICON) 1159MG CHEWABLE TABLET    Take 1 tablet by mouth prior to each meal.   VALACYCLOVIR (VALTREX) 500 MG TABLET    Take 500 mg by mouth daily.   ZOLPIDEM (AMBIEN) 5 MG TABLET    Take 1 tablet (5 mg total) by mouth at bedtime as needed for sleep.  Modified Medications   No medications on file  Discontinued Medications   No medications on file    Review of Systems  Constitutional: Positive for appetite change and fatigue.  All other systems reviewed and are negative.   Filed Vitals:   01/30/16 1251  BP: 114/76  Pulse: 89  Temp: 98.9 F (37.2 C)  TempSrc: Oral  Resp: 16  Height: 5' 6" (1.676 m)  Weight: 128 lb (58.06 kg)   Body mass index is 20.67 kg/(m^2).  Physical Exam  Constitutional: She appears cachectic. She appears distressed.  Sitting in w/c in NAD. Frail appearing  HENT:  Mouth/Throat: Oropharynx is clear and moist. No oropharyngeal exudate.  Eyes: Pupils are equal, round, and reactive to light. No scleral icterus.  Neck: Neck supple. Carotid bruit is not present. No tracheal deviation present.  Cardiovascular: Normal rate, regular rhythm and intact  distal pulses.  Exam reveals no gallop and no friction rub.   Murmur (1/6 SEM) heard. Left arm AVF with thrill/audible bruit. Trace BLE edema. No calf TTP b/l. Extensive soft left leg varicose veins, no palpable nodules  Pulmonary/Chest: Effort normal and breath sounds normal. No stridor. No respiratory distress. She has no wheezes. She has no rales.  Abdominal: Soft. Bowel sounds are normal. She exhibits no distension and no mass. There is no hepatomegaly. There is no tenderness. There is no rebound and no guarding.  Musculoskeletal: She exhibits edema.  Lymphadenopathy:    She has no cervical  adenopathy.  Neurological: She is alert.  AO x 2 in NAD  Skin: Skin is warm and dry. No rash noted.  Psychiatric: She has a normal mood and affect. Her behavior is normal. She is not actively hallucinating (says her friend is sitting on the bed). Thought content is not delusional.     Labs reviewed: No visits with results within 3 Month(s) from this visit. Latest known visit with results is:  Admission on 09/01/2015, Discharged on 09/08/2015  Component Date Value Ref Range Status  . Sodium 09/01/2015 135  135 - 145 mmol/L Final  . Potassium 09/01/2015 4.4  3.5 - 5.1 mmol/L Final  . Chloride 09/01/2015 95* 101 - 111 mmol/L Final  . CO2 09/01/2015 34* 22 - 32 mmol/L Final  . Glucose, Bld 09/01/2015 138* 65 - 99 mg/dL Final  . BUN 09/01/2015 25* 6 - 20 mg/dL Final  . Creatinine, Ser 09/01/2015 5.23* 0.44 - 1.00 mg/dL Final  . Calcium 09/01/2015 9.1  8.9 - 10.3 mg/dL Final  . GFR calc non Af Amer 09/01/2015 7* >60 mL/min Final  . GFR calc Af Amer 09/01/2015 8* >60 mL/min Final   Comment: (NOTE) The eGFR has been calculated using the CKD EPI equation. This calculation has not been validated in all clinical situations. eGFR's persistently <60 mL/min signify possible Chronic Kidney Disease.   . Anion gap 09/01/2015 6  5 - 15 Final  . WBC 09/01/2015 3.8* 4.0 - 10.5 K/uL Final  . RBC 09/01/2015  3.38* 3.87 - 5.11 MIL/uL Final  . Hemoglobin 09/01/2015 10.7* 12.0 - 15.0 g/dL Final  . HCT 09/01/2015 34.8* 36.0 - 46.0 % Final  . MCV 09/01/2015 103.0* 78.0 - 100.0 fL Final  . MCH 09/01/2015 31.7  26.0 - 34.0 pg Final  . MCHC 09/01/2015 30.7  30.0 - 36.0 g/dL Final  . RDW 09/01/2015 16.5* 11.5 - 15.5 % Final  . Platelets 09/01/2015 162  150 - 400 K/uL Final  . Neutrophils Relative % 09/01/2015 86   Final  . Neutro Abs 09/01/2015 3.3  1.7 - 7.7 K/uL Final  . Lymphocytes Relative 09/01/2015 10   Final  . Lymphs Abs 09/01/2015 0.4* 0.7 - 4.0 K/uL Final  . Monocytes Relative 09/01/2015 3   Final  . Monocytes Absolute 09/01/2015 0.1  0.1 - 1.0 K/uL Final  . Eosinophils Relative 09/01/2015 1   Final  . Eosinophils Absolute 09/01/2015 0.0  0.0 - 0.7 K/uL Final  . Basophils Relative 09/01/2015 0   Final  . Basophils Absolute 09/01/2015 0.0  0.0 - 0.1 K/uL Final  . WBC 09/02/2015 4.2  4.0 - 10.5 K/uL Final  . RBC 09/02/2015 3.06* 3.87 - 5.11 MIL/uL Final  . Hemoglobin 09/02/2015 9.6* 12.0 - 15.0 g/dL Final  . HCT 09/02/2015 31.4* 36.0 - 46.0 % Final  . MCV 09/02/2015 102.6* 78.0 - 100.0 fL Final  . MCH 09/02/2015 31.4  26.0 - 34.0 pg Final  . MCHC 09/02/2015 30.6  30.0 - 36.0 g/dL Final  . RDW 09/02/2015 16.6* 11.5 - 15.5 % Final  . Platelets 09/02/2015 160  150 - 400 K/uL Final  . Sodium 09/02/2015 135  135 - 145 mmol/L Final  . Potassium 09/02/2015 5.2* 3.5 - 5.1 mmol/L Final  . Chloride 09/02/2015 96* 101 - 111 mmol/L Final  . CO2 09/02/2015 30  22 - 32 mmol/L Final  . Glucose, Bld 09/02/2015 95  65 - 99 mg/dL Final  . BUN 09/02/2015 28* 6 - 20 mg/dL Final  .  Creatinine, Ser 09/02/2015 5.69* 0.44 - 1.00 mg/dL Final  . Calcium 09/02/2015 8.6* 8.9 - 10.3 mg/dL Final  . GFR calc non Af Amer 09/02/2015 7* >60 mL/min Final  . GFR calc Af Amer 09/02/2015 8* >60 mL/min Final   Comment: (NOTE) The eGFR has been calculated using the CKD EPI equation. This calculation has not been validated  in all clinical situations. eGFR's persistently <60 mL/min signify possible Chronic Kidney Disease.   . Anion gap 09/02/2015 9  5 - 15 Final  . ABO/RH(D) 09/02/2015 O POS   Final  . Antibody Screen 09/02/2015 NEG   Final  . Sample Expiration 09/02/2015 09/05/2015   Final  . WBC 09/03/2015 3.9* 4.0 - 10.5 K/uL Final  . RBC 09/03/2015 2.80* 3.87 - 5.11 MIL/uL Final  . Hemoglobin 09/03/2015 8.7* 12.0 - 15.0 g/dL Final  . HCT 09/03/2015 28.5* 36.0 - 46.0 % Final  . MCV 09/03/2015 101.8* 78.0 - 100.0 fL Final  . MCH 09/03/2015 31.1  26.0 - 34.0 pg Final  . MCHC 09/03/2015 30.5  30.0 - 36.0 g/dL Final  . RDW 09/03/2015 16.5* 11.5 - 15.5 % Final  . Platelets 09/03/2015 167  150 - 400 K/uL Final  . Sodium 09/03/2015 130* 135 - 145 mmol/L Final  . Potassium 09/03/2015 6.2* 3.5 - 5.1 mmol/L Final   Comment: CRITICAL RESULT CALLED TO, READ BACK BY AND VERIFIED WITH: WHITEHORN,S RN 09/03/2015 0628 JORDANS   . Chloride 09/03/2015 94* 101 - 111 mmol/L Final  . CO2 09/03/2015 29  22 - 32 mmol/L Final  . Glucose, Bld 09/03/2015 113* 65 - 99 mg/dL Final  . BUN 09/03/2015 46* 6 - 20 mg/dL Final  . Creatinine, Ser 09/03/2015 7.66* 0.44 - 1.00 mg/dL Final  . Calcium 09/03/2015 8.9  8.9 - 10.3 mg/dL Final  . Phosphorus 09/03/2015 5.6* 2.5 - 4.6 mg/dL Final  . Albumin 09/03/2015 2.6* 3.5 - 5.0 g/dL Final  . GFR calc non Af Amer 09/03/2015 5* >60 mL/min Final  . GFR calc Af Amer 09/03/2015 5* >60 mL/min Final   Comment: (NOTE) The eGFR has been calculated using the CKD EPI equation. This calculation has not been validated in all clinical situations. eGFR's persistently <60 mL/min signify possible Chronic Kidney Disease.   . Anion gap 09/03/2015 7  5 - 15 Final  . Sodium 09/03/2015 130* 135 - 145 mmol/L Final  . Potassium 09/03/2015 4.1  3.5 - 5.1 mmol/L Final   DELTA CHECK NOTED  . Chloride 09/03/2015 91* 101 - 111 mmol/L Final  . CO2 09/03/2015 29  22 - 32 mmol/L Final  . Glucose, Bld  09/03/2015 90  65 - 99 mg/dL Final  . BUN 09/03/2015 30* 6 - 20 mg/dL Final  . Creatinine, Ser 09/03/2015 5.15* 0.44 - 1.00 mg/dL Final  . Calcium 09/03/2015 8.6* 8.9 - 10.3 mg/dL Final  . GFR calc non Af Amer 09/03/2015 7* >60 mL/min Final  . GFR calc Af Amer 09/03/2015 9* >60 mL/min Final   Comment: (NOTE) The eGFR has been calculated using the CKD EPI equation. This calculation has not been validated in all clinical situations. eGFR's persistently <60 mL/min signify possible Chronic Kidney Disease.   . Anion gap 09/03/2015 10  5 - 15 Final  . Sodium 09/06/2015 123* 135 - 145 mmol/L Final  . Potassium 09/06/2015 5.6* 3.5 - 5.1 mmol/L Final  . Chloride 09/06/2015 88* 101 - 111 mmol/L Final  . CO2 09/06/2015 25  22 - 32 mmol/L Final  .  Glucose, Bld 09/06/2015 118* 65 - 99 mg/dL Final  . BUN 09/06/2015 59* 6 - 20 mg/dL Final  . Creatinine, Ser 09/06/2015 8.62* 0.44 - 1.00 mg/dL Final  . Calcium 09/06/2015 7.9* 8.9 - 10.3 mg/dL Final  . GFR calc non Af Amer 09/06/2015 4* >60 mL/min Final  . GFR calc Af Amer 09/06/2015 5* >60 mL/min Final   Comment: (NOTE) The eGFR has been calculated using the CKD EPI equation. This calculation has not been validated in all clinical situations. eGFR's persistently <60 mL/min signify possible Chronic Kidney Disease.   . Anion gap 09/06/2015 10  5 - 15 Final  . WBC 09/06/2015 2.1* 4.0 - 10.5 K/uL Final  . RBC 09/06/2015 2.28* 3.87 - 5.11 MIL/uL Final  . Hemoglobin 09/06/2015 7.1* 12.0 - 15.0 g/dL Final  . HCT 09/06/2015 22.3* 36.0 - 46.0 % Final  . MCV 09/06/2015 97.8  78.0 - 100.0 fL Final  . MCH 09/06/2015 31.1  26.0 - 34.0 pg Final  . MCHC 09/06/2015 31.8  30.0 - 36.0 g/dL Final  . RDW 09/06/2015 15.3  11.5 - 15.5 % Final  . Platelets 09/06/2015 164  150 - 400 K/uL Final  . Neutrophils Relative % 09/06/2015 71   Final  . Neutro Abs 09/06/2015 1.5* 1.7 - 7.7 K/uL Final  . Lymphocytes Relative 09/06/2015 16   Final  . Lymphs Abs 09/06/2015 0.3*  0.7 - 4.0 K/uL Final  . Monocytes Relative 09/06/2015 9   Final  . Monocytes Absolute 09/06/2015 0.2  0.1 - 1.0 K/uL Final  . Eosinophils Relative 09/06/2015 3   Final  . Eosinophils Absolute 09/06/2015 0.1  0.0 - 0.7 K/uL Final  . Basophils Relative 09/06/2015 1   Final  . Basophils Absolute 09/06/2015 0.0  0.0 - 0.1 K/uL Final  . ABO/RH(D) 09/06/2015 O POS   Final  . Antibody Screen 09/06/2015 NEG   Final  . Sample Expiration 09/06/2015 09/09/2015   Final  . Unit Number 09/06/2015 [id number]   Final  . Blood Component Type 09/06/2015 RED CELLS,LR   Final  . Unit division 09/06/2015 00   Final  . Status of Unit 09/06/2015 ISSUED,FINAL   Final  . Transfusion Status 09/06/2015 OK TO TRANSFUSE   Final  . Crossmatch Result 09/06/2015 Compatible   Final  . Unit Number 09/06/2015 [id number]   Final  . Blood Component Type 09/06/2015 RBC LR PHER2   Final  . Unit division 09/06/2015 00   Final  . Status of Unit 09/06/2015 ISSUED,FINAL   Final  . Transfusion Status 09/06/2015 OK TO TRANSFUSE   Final  . Crossmatch Result 09/06/2015 Compatible   Final  . Order Confirmation 09/06/2015 ORDER PROCESSED BY BLOOD BANK   Final  . WBC 09/07/2015 1.7* 4.0 - 10.5 K/uL Final  . RBC 09/07/2015 2.86* 3.87 - 5.11 MIL/uL Final  . Hemoglobin 09/07/2015 8.9* 12.0 - 15.0 g/dL Final   POST TRANSFUSION SPECIMEN  . HCT 09/07/2015 27.9* 36.0 - 46.0 % Final  . MCV 09/07/2015 97.6  78.0 - 100.0 fL Final  . MCH 09/07/2015 31.1  26.0 - 34.0 pg Final  . MCHC 09/07/2015 31.9  30.0 - 36.0 g/dL Final  . RDW 09/07/2015 16.8* 11.5 - 15.5 % Final  . Platelets 09/07/2015 155  150 - 400 K/uL Final  . Sodium 09/08/2015 125* 135 - 145 mmol/L Final  . Potassium 09/08/2015 4.8  3.5 - 5.1 mmol/L Final  . Chloride 09/08/2015 90* 101 - 111 mmol/L Final  . CO2  09/08/2015 26  22 - 32 mmol/L Final  . Glucose, Bld 09/08/2015 104* 65 - 99 mg/dL Final  . BUN 09/08/2015 47* 6 - 20 mg/dL Final  . Creatinine, Ser 09/08/2015 8.22*  0.44 - 1.00 mg/dL Final  . Calcium 09/08/2015 8.1* 8.9 - 10.3 mg/dL Final  . GFR calc non Af Amer 09/08/2015 4* >60 mL/min Final  . GFR calc Af Amer 09/08/2015 5* >60 mL/min Final   Comment: (NOTE) The eGFR has been calculated using the CKD EPI equation. This calculation has not been validated in all clinical situations. eGFR's persistently <60 mL/min signify possible Chronic Kidney Disease.   . Anion gap 09/08/2015 9  5 - 15 Final  . WBC 09/08/2015 2.2* 4.0 - 10.5 K/uL Final  . RBC 09/08/2015 2.86* 3.87 - 5.11 MIL/uL Final  . Hemoglobin 09/08/2015 8.7* 12.0 - 15.0 g/dL Final  . HCT 09/08/2015 27.3* 36.0 - 46.0 % Final  . MCV 09/08/2015 95.5  78.0 - 100.0 fL Final  . MCH 09/08/2015 30.4  26.0 - 34.0 pg Final  . MCHC 09/08/2015 31.9  30.0 - 36.0 g/dL Final  . RDW 09/08/2015 15.7* 11.5 - 15.5 % Final  . Platelets 09/08/2015 173  150 - 400 K/uL Final    No results found.   Assessment/Plan   ICD-9-CM ICD-10-CM   1. Leukopenia due to antineoplastic chemotherapy 288.50 D72.819    E933.1 T45.1X5A   2. Multiple myeloma not having achieved remission ([facility]) 203.00 C90.00   3. ESRD on dialysis ([place]) 585.6 N18.6    V45.11 Z99.2   4. Protein-calorie malnutrition, severe ([place]) 262 E43   5. Hypertension associatd with end stage renal disease on dialysis 403.91 I12.0    585.6 N18.6    V45.11 Z99.2   6. Gastroesophageal reflux disease without esophagitis 530.81 K21.9   7. 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 Continue current medications as ordered. PT/OT/ST as indicated. Will follow   S. Perlie Gold  [facility] [address] [place], [place] [address] [phone] Cell (Monday-Friday 8 AM - 5 PM) [phone] After 5 PM and follow prompts

## 2016-02-17 ENCOUNTER — Inpatient Hospital Stay (HOSPITAL_COMMUNITY)
Admission: EM | Admit: 2016-02-17 | Discharge: 2016-03-01 | DRG: 853 | Disposition: A | Discharge status: Skilled Nursing Facility | Payer: Medicare Other | Attending: Internal Medicine | Admitting: Internal Medicine

## 2016-02-17 ENCOUNTER — Emergency Department (HOSPITAL_COMMUNITY): Payer: Medicare Other

## 2016-02-17 ENCOUNTER — Encounter (HOSPITAL_COMMUNITY): Payer: Self-pay | Admitting: Emergency Medicine

## 2016-02-17 DIAGNOSIS — D709 Neutropenia, unspecified: Secondary | ICD-10-CM | POA: Diagnosis present

## 2016-02-17 DIAGNOSIS — M25562 Pain in left knee: Secondary | ICD-10-CM

## 2016-02-17 DIAGNOSIS — M25462 Effusion, left knee: Secondary | ICD-10-CM | POA: Diagnosis present

## 2016-02-17 DIAGNOSIS — B951 Streptococcus, group B, as the cause of diseases classified elsewhere: Secondary | ICD-10-CM | POA: Diagnosis present

## 2016-02-17 DIAGNOSIS — Z992 Dependence on renal dialysis: Secondary | ICD-10-CM

## 2016-02-17 DIAGNOSIS — A401 Sepsis due to streptococcus, group B: Secondary | ICD-10-CM | POA: Diagnosis not present

## 2016-02-17 DIAGNOSIS — R29898 Other symptoms and signs involving the musculoskeletal system: Secondary | ICD-10-CM

## 2016-02-17 DIAGNOSIS — I160 Hypertensive urgency: Secondary | ICD-10-CM | POA: Diagnosis present

## 2016-02-17 DIAGNOSIS — R509 Fever, unspecified: Secondary | ICD-10-CM

## 2016-02-17 DIAGNOSIS — C9000 Multiple myeloma not having achieved remission: Secondary | ICD-10-CM | POA: Diagnosis present

## 2016-02-17 DIAGNOSIS — E875 Hyperkalemia: Secondary | ICD-10-CM | POA: Diagnosis present

## 2016-02-17 DIAGNOSIS — N390 Urinary tract infection, site not specified: Secondary | ICD-10-CM | POA: Diagnosis present

## 2016-02-17 DIAGNOSIS — R451 Restlessness and agitation: Secondary | ICD-10-CM | POA: Diagnosis not present

## 2016-02-17 DIAGNOSIS — N2581 Secondary hyperparathyroidism of renal origin: Secondary | ICD-10-CM | POA: Diagnosis present

## 2016-02-17 DIAGNOSIS — M00062 Staphylococcal arthritis, left knee: Secondary | ICD-10-CM | POA: Diagnosis present

## 2016-02-17 DIAGNOSIS — R4182 Altered mental status, unspecified: Secondary | ICD-10-CM | POA: Diagnosis present

## 2016-02-17 DIAGNOSIS — R7881 Bacteremia: Secondary | ICD-10-CM | POA: Diagnosis present

## 2016-02-17 DIAGNOSIS — Z79899 Other long term (current) drug therapy: Secondary | ICD-10-CM

## 2016-02-17 DIAGNOSIS — D899 Disorder involving the immune mechanism, unspecified: Secondary | ICD-10-CM | POA: Diagnosis present

## 2016-02-17 DIAGNOSIS — M25519 Pain in unspecified shoulder: Secondary | ICD-10-CM | POA: Diagnosis not present

## 2016-02-17 DIAGNOSIS — R5081 Fever presenting with conditions classified elsewhere: Secondary | ICD-10-CM | POA: Diagnosis present

## 2016-02-17 DIAGNOSIS — I120 Hypertensive chronic kidney disease with stage 5 chronic kidney disease or end stage renal disease: Secondary | ICD-10-CM

## 2016-02-17 DIAGNOSIS — N186 End stage renal disease: Secondary | ICD-10-CM | POA: Diagnosis present

## 2016-02-17 DIAGNOSIS — E8889 Other specified metabolic disorders: Secondary | ICD-10-CM | POA: Diagnosis present

## 2016-02-17 DIAGNOSIS — E871 Hypo-osmolality and hyponatremia: Secondary | ICD-10-CM | POA: Diagnosis present

## 2016-02-17 DIAGNOSIS — D630 Anemia in neoplastic disease: Secondary | ICD-10-CM | POA: Diagnosis present

## 2016-02-17 DIAGNOSIS — I10 Essential (primary) hypertension: Secondary | ICD-10-CM | POA: Diagnosis present

## 2016-02-17 DIAGNOSIS — I330 Acute and subacute infective endocarditis: Secondary | ICD-10-CM | POA: Diagnosis present

## 2016-02-17 DIAGNOSIS — F919 Conduct disorder, unspecified: Secondary | ICD-10-CM | POA: Diagnosis present

## 2016-02-17 DIAGNOSIS — R531 Weakness: Secondary | ICD-10-CM

## 2016-02-17 DIAGNOSIS — K219 Gastro-esophageal reflux disease without esophagitis: Secondary | ICD-10-CM | POA: Diagnosis present

## 2016-02-17 LAB — CBC WITH DIFFERENTIAL/PLATELET
BASOS ABS: 0 10*3/uL (ref 0.0–0.1)
BASOS PCT: 0 %
EOS ABS: 0 10*3/uL (ref 0.0–0.7)
EOS PCT: 0 %
HEMATOCRIT: 36.7 % (ref 36.0–46.0)
Hemoglobin: 12.3 g/dL (ref 12.0–15.0)
LYMPHS PCT: 12 %
Lymphs Abs: 0.3 10*3/uL — ABNORMAL LOW (ref 0.7–4.0)
MCH: 35.5 pg — ABNORMAL HIGH (ref 26.0–34.0)
MCHC: 33.5 g/dL (ref 30.0–36.0)
MCV: 106.1 fL — ABNORMAL HIGH (ref 78.0–100.0)
Monocytes Absolute: 0.3 10*3/uL (ref 0.1–1.0)
Monocytes Relative: 13 %
NEUTROS PCT: 75 %
Neutro Abs: 1.6 10*3/uL — ABNORMAL LOW (ref 1.7–7.7)
Platelets: 164 10*3/uL (ref 150–400)
RBC: 3.46 MIL/uL — ABNORMAL LOW (ref 3.87–5.11)
RDW: 14.7 % (ref 11.5–15.5)
WBC: 2.2 10*3/uL — AB (ref 4.0–10.5)

## 2016-02-17 LAB — BASIC METABOLIC PANEL
ANION GAP: 12 (ref 5–15)
BUN: 39 mg/dL — ABNORMAL HIGH (ref 6–20)
CALCIUM: 8.4 mg/dL — AB (ref 8.9–10.3)
CO2: 26 mmol/L (ref 22–32)
CREATININE: 6.6 mg/dL — AB (ref 0.44–1.00)
Chloride: 92 mmol/L — ABNORMAL LOW (ref 101–111)
GFR, EST AFRICAN AMERICAN: 6 mL/min — AB (ref >60)
GFR, EST NON AFRICAN AMERICAN: 5 mL/min — AB (ref >60)
Glucose, Bld: 110 mg/dL — ABNORMAL HIGH (ref 65–99)
Potassium: 5.2 mmol/L — ABNORMAL HIGH (ref 3.5–5.1)
SODIUM: 130 mmol/L — AB (ref 135–145)

## 2016-02-17 NOTE — ED Notes (Signed)
Brought via EMS from [place].  Reports having dialysis yesterday.  Started having pain in left arm and left leg after dialysis needle dc'd.  Reports having nausea and vomiting Wednesday and Thursday but not today.

## 2016-02-18 ENCOUNTER — Inpatient Hospital Stay (HOSPITAL_COMMUNITY): Payer: Medicare Other

## 2016-02-18 ENCOUNTER — Encounter (HOSPITAL_COMMUNITY): Payer: Self-pay | Admitting: Internal Medicine

## 2016-02-18 DIAGNOSIS — I361 Nonrheumatic tricuspid (valve) insufficiency: Secondary | ICD-10-CM | POA: Diagnosis not present

## 2016-02-18 DIAGNOSIS — I330 Acute and subacute infective endocarditis: Secondary | ICD-10-CM | POA: Diagnosis present

## 2016-02-18 DIAGNOSIS — D899 Disorder involving the immune mechanism, unspecified: Secondary | ICD-10-CM | POA: Diagnosis present

## 2016-02-18 DIAGNOSIS — M25562 Pain in left knee: Secondary | ICD-10-CM | POA: Diagnosis not present

## 2016-02-18 DIAGNOSIS — I160 Hypertensive urgency: Secondary | ICD-10-CM | POA: Diagnosis present

## 2016-02-18 DIAGNOSIS — C9000 Multiple myeloma not having achieved remission: Secondary | ICD-10-CM | POA: Diagnosis present

## 2016-02-18 DIAGNOSIS — D709 Neutropenia, unspecified: Secondary | ICD-10-CM | POA: Diagnosis present

## 2016-02-18 DIAGNOSIS — N186 End stage renal disease: Secondary | ICD-10-CM | POA: Diagnosis present

## 2016-02-18 DIAGNOSIS — I120 Hypertensive chronic kidney disease with stage 5 chronic kidney disease or end stage renal disease: Secondary | ICD-10-CM | POA: Diagnosis not present

## 2016-02-18 DIAGNOSIS — R5081 Fever presenting with conditions classified elsewhere: Secondary | ICD-10-CM | POA: Diagnosis present

## 2016-02-18 DIAGNOSIS — Z79899 Other long term (current) drug therapy: Secondary | ICD-10-CM | POA: Diagnosis not present

## 2016-02-18 DIAGNOSIS — R509 Fever, unspecified: Secondary | ICD-10-CM | POA: Diagnosis not present

## 2016-02-18 DIAGNOSIS — Z9889 Other specified postprocedural states: Secondary | ICD-10-CM | POA: Diagnosis not present

## 2016-02-18 DIAGNOSIS — Z992 Dependence on renal dialysis: Secondary | ICD-10-CM | POA: Diagnosis not present

## 2016-02-18 DIAGNOSIS — M00262 Other streptococcal arthritis, left knee: Secondary | ICD-10-CM | POA: Diagnosis not present

## 2016-02-18 DIAGNOSIS — M00062 Staphylococcal arthritis, left knee: Secondary | ICD-10-CM | POA: Diagnosis present

## 2016-02-18 DIAGNOSIS — N390 Urinary tract infection, site not specified: Secondary | ICD-10-CM | POA: Diagnosis present

## 2016-02-18 DIAGNOSIS — K219 Gastro-esophageal reflux disease without esophagitis: Secondary | ICD-10-CM | POA: Diagnosis present

## 2016-02-18 DIAGNOSIS — R4182 Altered mental status, unspecified: Secondary | ICD-10-CM | POA: Diagnosis present

## 2016-02-18 DIAGNOSIS — E8889 Other specified metabolic disorders: Secondary | ICD-10-CM | POA: Diagnosis present

## 2016-02-18 DIAGNOSIS — D61810 Antineoplastic chemotherapy induced pancytopenia: Secondary | ICD-10-CM | POA: Diagnosis not present

## 2016-02-18 DIAGNOSIS — I10 Essential (primary) hypertension: Secondary | ICD-10-CM | POA: Diagnosis not present

## 2016-02-18 DIAGNOSIS — D630 Anemia in neoplastic disease: Secondary | ICD-10-CM | POA: Diagnosis present

## 2016-02-18 DIAGNOSIS — M25519 Pain in unspecified shoulder: Secondary | ICD-10-CM | POA: Diagnosis present

## 2016-02-18 DIAGNOSIS — B951 Streptococcus, group B, as the cause of diseases classified elsewhere: Secondary | ICD-10-CM | POA: Diagnosis not present

## 2016-02-18 DIAGNOSIS — F919 Conduct disorder, unspecified: Secondary | ICD-10-CM | POA: Diagnosis present

## 2016-02-18 DIAGNOSIS — N2581 Secondary hyperparathyroidism of renal origin: Secondary | ICD-10-CM | POA: Diagnosis present

## 2016-02-18 DIAGNOSIS — R531 Weakness: Secondary | ICD-10-CM | POA: Diagnosis not present

## 2016-02-18 DIAGNOSIS — E871 Hypo-osmolality and hyponatremia: Secondary | ICD-10-CM | POA: Diagnosis present

## 2016-02-18 DIAGNOSIS — E875 Hyperkalemia: Secondary | ICD-10-CM | POA: Diagnosis present

## 2016-02-18 DIAGNOSIS — M25462 Effusion, left knee: Secondary | ICD-10-CM | POA: Diagnosis not present

## 2016-02-18 DIAGNOSIS — A401 Sepsis due to streptococcus, group B: Secondary | ICD-10-CM | POA: Diagnosis present

## 2016-02-18 DIAGNOSIS — R451 Restlessness and agitation: Secondary | ICD-10-CM | POA: Diagnosis not present

## 2016-02-18 DIAGNOSIS — R7881 Bacteremia: Secondary | ICD-10-CM | POA: Diagnosis not present

## 2016-02-18 LAB — COMPREHENSIVE METABOLIC PANEL
ALBUMIN: 2.7 g/dL — AB (ref 3.5–5.0)
ALT: 16 U/L (ref 14–54)
AST: 21 U/L (ref 15–41)
Alkaline Phosphatase: 60 U/L (ref 38–126)
Anion gap: 11 (ref 5–15)
BUN: 44 mg/dL — AB (ref 6–20)
CHLORIDE: 91 mmol/L — AB (ref 101–111)
CO2: 26 mmol/L (ref 22–32)
CREATININE: 7.25 mg/dL — AB (ref 0.44–1.00)
Calcium: 9 mg/dL (ref 8.9–10.3)
GFR calc Af Amer: 6 mL/min — ABNORMAL LOW (ref >60)
GFR calc non Af Amer: 5 mL/min — ABNORMAL LOW (ref >60)
GLUCOSE: 112 mg/dL — AB (ref 65–99)
Potassium: 5.3 mmol/L — ABNORMAL HIGH (ref 3.5–5.1)
SODIUM: 128 mmol/L — AB (ref 135–145)
Total Bilirubin: 1 mg/dL (ref 0.3–1.2)
Total Protein: 7.7 g/dL (ref 6.5–8.1)

## 2016-02-18 LAB — CBC
HCT: 35 % — ABNORMAL LOW (ref 36.0–46.0)
Hemoglobin: 11.3 g/dL — ABNORMAL LOW (ref 12.0–15.0)
MCH: 34.6 pg — AB (ref 26.0–34.0)
MCHC: 32.3 g/dL (ref 30.0–36.0)
MCV: 107 fL — AB (ref 78.0–100.0)
PLATELETS: 131 10*3/uL — AB (ref 150–400)
RBC: 3.27 MIL/uL — AB (ref 3.87–5.11)
RDW: 14.3 % (ref 11.5–15.5)
WBC: 2.2 10*3/uL — ABNORMAL LOW (ref 4.0–10.5)

## 2016-02-18 LAB — URINALYSIS, ROUTINE W REFLEX MICROSCOPIC
Bilirubin Urine: NEGATIVE
GLUCOSE, UA: 100 mg/dL — AB
Ketones, ur: NEGATIVE mg/dL
Nitrite: NEGATIVE
PROTEIN: 100 mg/dL — AB
Specific Gravity, Urine: 1.01 (ref 1.005–1.030)
pH: 8 (ref 5.0–8.0)

## 2016-02-18 LAB — TSH: TSH: 0.636 u[IU]/mL (ref 0.350–4.500)

## 2016-02-18 LAB — URIC ACID: Uric Acid, Serum: 4 mg/dL (ref 2.3–6.6)

## 2016-02-18 LAB — MRSA PCR SCREENING: MRSA BY PCR: NEGATIVE

## 2016-02-18 LAB — URINE MICROSCOPIC-ADD ON

## 2016-02-18 LAB — MAGNESIUM: Magnesium: 2.1 mg/dL (ref 1.7–2.4)

## 2016-02-18 LAB — PHOSPHORUS: Phosphorus: 6.4 mg/dL — ABNORMAL HIGH (ref 2.5–4.6)

## 2016-02-18 MED ORDER — ACETAMINOPHEN 325 MG PO TABS
ORAL_TABLET | ORAL | Status: AC
  Administered 2016-02-18: 650 mg via ORAL
  Filled 2016-02-18: qty 2

## 2016-02-18 MED ORDER — SODIUM CHLORIDE 0.9 % IV SOLN: 100.0000 mL | INTRAVENOUS | Status: DC | PRN

## 2016-02-18 MED ORDER — ONDANSETRON HCL 4 MG PO TABS: 4.0000 mg | ORAL_TABLET | Freq: Four times a day (QID) | ORAL | Status: DC | PRN

## 2016-02-18 MED ORDER — MIRTAZAPINE 15 MG PO TBDP
15.0000 mg | ORAL_TABLET | Freq: Every day | ORAL | Status: DC
  Administered 2016-02-19 – 2016-02-29 (×11): 15 mg via ORAL
  Filled 2016-02-18 (×13): qty 1

## 2016-02-18 MED ORDER — NICARDIPINE HCL IN NACL 20-0.86 MG/200ML-% IV SOLN
5.0000 mg/h | INTRAVENOUS | Status: DC
  Filled 2016-02-18: qty 200

## 2016-02-18 MED ORDER — TRAMADOL HCL 50 MG PO TABS: 50.0000 mg | ORAL_TABLET | Freq: Four times a day (QID) | ORAL | Status: DC | PRN

## 2016-02-18 MED ORDER — ACETAMINOPHEN 325 MG PO TABS
650.0000 mg | ORAL_TABLET | Freq: Four times a day (QID) | ORAL | Status: DC | PRN
  Administered 2016-02-18 – 2016-02-28 (×9): 650 mg via ORAL
  Filled 2016-02-18 (×9): qty 2

## 2016-02-18 MED ORDER — PANTOPRAZOLE SODIUM 40 MG PO TBEC
40.0000 mg | DELAYED_RELEASE_TABLET | Freq: Every day | ORAL | Status: DC
  Administered 2016-02-18 – 2016-03-01 (×12): 40 mg via ORAL
  Filled 2016-02-18 (×12): qty 1

## 2016-02-18 MED ORDER — HYDRALAZINE HCL 10 MG PO TABS
10.0000 mg | ORAL_TABLET | Freq: Three times a day (TID) | ORAL | Status: DC
  Administered 2016-02-18 – 2016-02-22 (×10): 10 mg via ORAL
  Filled 2016-02-18 (×15): qty 1

## 2016-02-18 MED ORDER — HEPARIN SODIUM (PORCINE) 1000 UNIT/ML DIALYSIS: 1000.0000 [IU] | INTRAMUSCULAR | Status: DC | PRN

## 2016-02-18 MED ORDER — AMLODIPINE BESYLATE 5 MG PO TABS
5.0000 mg | ORAL_TABLET | Freq: Every day | ORAL | Status: DC
  Administered 2016-02-18 – 2016-02-24 (×5): 5 mg via ORAL
  Filled 2016-02-18 (×5): qty 1

## 2016-02-18 MED ORDER — SODIUM CHLORIDE 0.9 % IV SOLN
250.0000 mL | INTRAVENOUS | Status: DC | PRN
  Administered 2016-02-27: 11:00:00 via INTRAVENOUS

## 2016-02-18 MED ORDER — ONDANSETRON HCL 4 MG/2ML IJ SOLN
4.0000 mg | Freq: Four times a day (QID) | INTRAMUSCULAR | Status: DC | PRN
  Administered 2016-02-26: 4 mg via INTRAVENOUS
  Filled 2016-02-18: qty 2

## 2016-02-18 MED ORDER — DOXERCALCIFEROL 4 MCG/2ML IV SOLN
4.0000 ug | INTRAVENOUS | Status: DC
  Administered 2016-02-18 – 2016-02-23 (×3): 4 ug via INTRAVENOUS
  Filled 2016-02-18 (×3): qty 2

## 2016-02-18 MED ORDER — PRO-STAT SUGAR FREE PO LIQD
30.0000 mL | Freq: Two times a day (BID) | ORAL | Status: DC
  Administered 2016-02-18 – 2016-02-28 (×14): 30 mL via ORAL
  Filled 2016-02-18 (×16): qty 30

## 2016-02-18 MED ORDER — HYDROCODONE-ACETAMINOPHEN 5-325 MG PO TABS
1.0000 | ORAL_TABLET | Freq: Four times a day (QID) | ORAL | Status: DC | PRN
  Administered 2016-02-18 – 2016-03-01 (×10): 1 via ORAL
  Filled 2016-02-18 (×8): qty 1

## 2016-02-18 MED ORDER — ZOLPIDEM TARTRATE 5 MG PO TABS
5.0000 mg | ORAL_TABLET | Freq: Every evening | ORAL | Status: DC | PRN
  Administered 2016-02-27: 5 mg via ORAL
  Filled 2016-02-18: qty 1

## 2016-02-18 MED ORDER — CINACALCET HCL 30 MG PO TABS
60.0000 mg | ORAL_TABLET | Freq: Every day | ORAL | Status: DC
  Administered 2016-02-18 – 2016-03-01 (×9): 60 mg via ORAL
  Filled 2016-02-18 (×14): qty 2

## 2016-02-18 MED ORDER — SODIUM CHLORIDE 0.9% FLUSH
3.0000 mL | Freq: Two times a day (BID) | INTRAVENOUS | Status: DC
  Administered 2016-02-18 – 2016-03-01 (×17): 3 mL via INTRAVENOUS

## 2016-02-18 MED ORDER — HYDROCODONE-ACETAMINOPHEN 5-325 MG PO TABS
ORAL_TABLET | ORAL | Status: AC
  Administered 2016-02-18: 1 via ORAL
  Filled 2016-02-18: qty 1

## 2016-02-18 MED ORDER — LIDOCAINE HCL (PF) 1 % IJ SOLN: 5.0000 mL | INTRAMUSCULAR | Status: DC | PRN

## 2016-02-18 MED ORDER — ISOSORBIDE MONONITRATE ER 60 MG PO TB24
60.0000 mg | ORAL_TABLET | Freq: Every day | ORAL | Status: DC
  Administered 2016-02-18 – 2016-02-24 (×5): 60 mg via ORAL
  Filled 2016-02-18 (×6): qty 1

## 2016-02-18 MED ORDER — LIDOCAINE-PRILOCAINE 2.5-2.5 % EX CREA: 1.0000 "application " | TOPICAL_CREAM | CUTANEOUS | Status: DC | PRN

## 2016-02-18 MED ORDER — PENTAFLUOROPROP-TETRAFLUOROETH EX AERO: 1.0000 "application " | INHALATION_SPRAY | CUTANEOUS | Status: DC | PRN

## 2016-02-18 MED ORDER — HYDRALAZINE HCL 20 MG/ML IJ SOLN
10.0000 mg | INTRAMUSCULAR | Status: DC | PRN
  Administered 2016-02-18 – 2016-02-20 (×3): 10 mg via INTRAVENOUS
  Filled 2016-02-18 (×3): qty 1

## 2016-02-18 MED ORDER — SODIUM CHLORIDE 0.9% FLUSH
3.0000 mL | Freq: Two times a day (BID) | INTRAVENOUS | Status: DC
  Administered 2016-02-18 – 2016-02-27 (×9): 3 mL via INTRAVENOUS

## 2016-02-18 MED ORDER — RENA-VITE PO TABS
1.0000 | ORAL_TABLET | Freq: Every day | ORAL | Status: DC
  Administered 2016-02-19 – 2016-02-29 (×10): 1 via ORAL
  Filled 2016-02-18 (×10): qty 1

## 2016-02-18 MED ORDER — FAMOTIDINE 20 MG PO TABS
10.0000 mg | ORAL_TABLET | Freq: Every day | ORAL | Status: DC
  Administered 2016-02-18 – 2016-03-01 (×12): 10 mg via ORAL
  Filled 2016-02-18 (×12): qty 1

## 2016-02-18 MED ORDER — BISACODYL 10 MG RE SUPP: 10.0000 mg | Freq: Every day | RECTAL | Status: DC | PRN

## 2016-02-18 MED ORDER — PROCHLORPERAZINE MALEATE 10 MG PO TABS
10.0000 mg | ORAL_TABLET | Freq: Four times a day (QID) | ORAL | Status: DC | PRN
  Filled 2016-02-18: qty 1

## 2016-02-18 MED ORDER — SODIUM CHLORIDE 0.9% FLUSH: 3.0000 mL | INTRAVENOUS | Status: DC | PRN

## 2016-02-18 MED ORDER — CALCIUM ACETATE (PHOS BINDER) 667 MG PO CAPS
667.0000 mg | ORAL_CAPSULE | Freq: Three times a day (TID) | ORAL | Status: DC
  Administered 2016-02-18 – 2016-03-01 (×23): 667 mg via ORAL
  Filled 2016-02-18 (×24): qty 1

## 2016-02-18 MED ORDER — LANTHANUM CARBONATE 500 MG PO CHEW
2000.0000 mg | CHEWABLE_TABLET | Freq: Three times a day (TID) | ORAL | Status: DC
  Administered 2016-02-18 – 2016-03-01 (×22): 2000 mg via ORAL
  Filled 2016-02-18 (×28): qty 4

## 2016-02-18 MED ORDER — ACETAMINOPHEN 650 MG RE SUPP
650.0000 mg | Freq: Four times a day (QID) | RECTAL | Status: DC | PRN
  Administered 2016-02-19: 650 mg via RECTAL
  Filled 2016-02-18: qty 1

## 2016-02-18 MED ORDER — HYDROCODONE-ACETAMINOPHEN 5-325 MG PO TABS
1.0000 | ORAL_TABLET | ORAL | Status: DC | PRN
  Administered 2016-02-18 (×2): 1 via ORAL
  Filled 2016-02-18 (×2): qty 1

## 2016-02-18 MED ORDER — DOXERCALCIFEROL 4 MCG/2ML IV SOLN
INTRAVENOUS | Status: AC
  Administered 2016-02-18: 4 ug via INTRAVENOUS
  Filled 2016-02-18: qty 2

## 2016-02-18 MED ORDER — ALTEPLASE 2 MG IJ SOLR: 2.0000 mg | Freq: Once | INTRAMUSCULAR | Status: DC | PRN

## 2016-02-18 MED ORDER — VALACYCLOVIR HCL 500 MG PO TABS
500.0000 mg | ORAL_TABLET | Freq: Every day | ORAL | Status: DC
  Administered 2016-02-18 – 2016-02-20 (×3): 500 mg via ORAL
  Filled 2016-02-18 (×3): qty 1

## 2016-02-18 NOTE — ED Provider Notes (Addendum)
CSN: [id number]     Arrival date & time 02/17/16  2043 History   First MD Initiated Contact with Patient 02/17/16 2056     Chief Complaint  Patient presents with  . Arm Pain  . Leg Pain     (Consider location/radiation/quality/duration/timing/severity/associated sxs/prior Treatment) HPI...Marland KitchenMarland KitchenLeft arm and leg weakness after dialysis earlier today. Patient also complains of left knee pain. She is currently residing in [place]. Past medical history includes end-stage renal disease with dialysis on Tuesday Thursday and Saturday., hypertension, status post fracture of the distal right femur, although myeloma. No altered mental status, facial droop, right-sided symptoms  Past Medical History  Diagnosis Date  . ESRD (end stage renal disease) ([place]) 03/11/2014  . Hypertension associatd with end stage renal disease on dialysis 03/11/2014  . GERD (gastroesophageal reflux disease) 04/23/2015  . Closed fracture of right distal femur ([place]) 09/01/2015  . Multiple myeloma ([place]) 03/11/2014  . Malnutrition of moderate degree 09/02/2015   Past Surgical History  Procedure Laterality Date  . Abdominal hysterectomy      Pt. denies   Family History  Problem Relation Age of Onset  . Hypertension Mother   . Hypertension Father    Social History  Substance Use Topics  . Smoking status: Never Smoker   . Smokeless tobacco: Never Used  . Alcohol Use: No   OB History    No data available     Review of Systems  All other systems reviewed and are negative.     Allergies  Review of patient's allergies indicates no known allergies.  Home Medications   Prior to Admission medications   Medication Sig Start Date End Date Taking? Authorizing Provider  Amino Acids-Protein Hydrolys (FEEDING SUPPLEMENT, PRO-STAT SUGAR FREE 64,) LIQD Take 30 mLs by mouth 2 (two) times daily.   Yes Historical Provider, MD  amLODipine (NORVASC) 10 MG tablet Take 0.5 tablets (5 mg total) by mouth daily.  03/21/15  Yes Josalyn Funches, MD  bisacodyl (DULCOLAX) 10 MG suppository Place 1 suppository (10 mg total) rectally daily as needed for moderate constipation. 09/07/15  Yes Theodis Blaze, MD  cinacalcet (SENSIPAR) 60 MG tablet Take 60 mg by mouth daily.   Yes Historical Provider, MD  furosemide (LASIX) 20 MG tablet Take 1 tablet (20 mg total) by mouth daily. PRN for leg edema Patient taking differently: Take 20 mg by mouth daily as needed for fluid or edema. PRN for leg edema 03/21/15  Yes Josalyn Funches, MD  hydrALAZINE (APRESOLINE) 10 MG tablet Take 1 tablet (10 mg total) by mouth 3 (three) times daily. 09/07/15  Yes Theodis Blaze, MD  HYDROcodone-acetaminophen (NORCO/VICODIN) 5-325 MG tablet Take 1 tablet by mouth every 6 hours as needed for moderate pain control. Take 2 tablets by mouth every 6 hours as needed for severe pain control. 11/22/15  Yes Estill Dooms, MD  isosorbide mononitrate (IMDUR) 60 MG 24 hr tablet Take 60 mg by mouth daily. 03/24/15  Yes Historical Provider, MD  lanthanum (FOSRENOL) 1000 MG chewable tablet Chew 2,000 mg by mouth 3 (three) times daily with meals. Take 2 tablets (='2000mg'$ ) by mouth AC.   Yes Historical Provider, MD  magnesium hydroxide (MILK OF MAGNESIA) 400 MG/5ML suspension Take 30 mLs by mouth daily as needed for mild constipation.   Yes Historical Provider, MD  mirtazapine (REMERON SOL-TAB) 15 MG disintegrating tablet Take 15 mg by mouth at bedtime. Dissolve 1 tablet by mouth at bedtime for appetite.   Yes Historical Provider, MD  multivitamin (RENA-VIT) TABS tablet Take 1 tablet by mouth at bedtime. Patient taking differently: Take 1 tablet by mouth every morning.  04/13/15  Yes Annita Brod, MD  ondansetron (ZOFRAN ODT) 4 MG disintegrating tablet Take 1 tablet (4 mg total) by mouth every 8 (eight) hours as needed for nausea or vomiting. 01/21/15  Yes Everlene Balls, MD  pantoprazole (PROTONIX) 40 MG tablet TAKE 1 TABLET BY MOUTH DAILY 09/06/15  Yes Boykin Nearing, MD  prochlorperazine (COMPAZINE) 10 MG tablet Take 1 tablet (10 mg total) by mouth every 6 (six) hours as needed (Nausea or vomiting). 03/25/14  Yes Concha Norway, MD  ranitidine (ZANTAC) 300 MG tablet Take 300 mg by mouth at bedtime.   Yes Historical Provider, MD  simethicone (MYLICON) 101 MG chewable tablet Take 1 tablet by mouth prior to each meal.   Yes Historical Provider, MD  sodium phosphate (FLEET) enema Place 1 enema rectally once. follow package directions   Yes Historical Provider, MD  valACYclovir (VALTREX) 500 MG tablet Take 500 mg by mouth daily.   Yes Historical Provider, MD  zolpidem (AMBIEN) 5 MG tablet Take 1 tablet (5 mg total) by mouth at bedtime as needed for sleep. 12/01/15  Yes Tiffany L Reed, DO   BP 167/146 mmHg  Pulse 77  Temp(Src) 99.7 F (37.6 C) (Oral)  Resp 22  Ht '5\' 7"'$  (1.702 m)  Wt 137 lb (62.143 kg)  BMI 21.45 kg/m2  SpO2 96% Physical Exam  Constitutional: She appears well-developed and well-nourished.  HENT:  Head: Normocephalic and atraumatic.  Eyes: Conjunctivae and EOM are normal. Pupils are equal, round, and reactive to light.  Neck: Normal range of motion. Neck supple.  Cardiovascular: Normal rate and regular rhythm.   Pulmonary/Chest: Effort normal and breath sounds normal.  Abdominal: Soft. Bowel sounds are normal.  Musculoskeletal: Normal range of motion.  Neurological: She is alert.  Difficult to evaluate, however, left arm and left way leg appear weaker than right arm and leg  Skin: Skin is warm and dry.  Psychiatric: She has a normal mood and affect. Her behavior is normal.  Nursing note and vitals reviewed.   ED Course  Procedures (including critical care time) Labs Review Labs Reviewed  CBC WITH DIFFERENTIAL/PLATELET - Abnormal; Notable for the following:    WBC 2.2 (*)    RBC 3.46 (*)    MCV 106.1 (*)    MCH 35.5 (*)    Neutro Abs 1.6 (*)    Lymphs Abs 0.3 (*)    All other components within normal limits  BASIC  METABOLIC PANEL - Abnormal; Notable for the following:    Sodium 130 (*)    Potassium 5.2 (*)    Chloride 92 (*)    Glucose, Bld 110 (*)    BUN 39 (*)    Creatinine, Ser 6.60 (*)    Calcium 8.4 (*)    GFR calc non Af Amer 5 (*)    GFR calc Af Amer 6 (*)    All other components within normal limits    Imaging Review Ct Head Wo Contrast  02/17/2016  CLINICAL DATA:  Left arm pain, hypertensive, weakness, weight loss. EXAM: CT HEAD WITHOUT CONTRAST TECHNIQUE: Contiguous axial images were obtained from the base of the skull through the vertex without intravenous contrast. COMPARISON:  Head CT dated 04/10/2015. FINDINGS: Brain: There is mild generalized age-related brain atrophy with commensurate dilatation of the ventricles and sulci. Mild chronic small vessel ischemic changes noted within the deep  periventricular white matter. Small old lacunar infarcts noted within the basal ganglia. There is no mass, hemorrhage, edema or other evidence of acute parenchymal abnormality. No extra-axial hemorrhage. Vascular: No hyperdense vessel or unexpected calcification. There are chronic calcified atherosclerotic changes of the large vessels at the skull base. Skull: Negative for fracture or focal lesion. Sinuses/Orbits: Visualized upper paranasal sinuses are clear. Mastoid air cells are clear. Other: None. IMPRESSION: No acute findings.  No intracranial mass, hemorrhage or edema. Electronically Signed   By: Franki Cabot M.D.   On: 02/17/2016 21:58   Dg Knee Complete 4 Views Left  02/17/2016  CLINICAL DATA:  Left knee pain and swelling.  No reported injury. EXAM: LEFT KNEE - COMPLETE 4+ VIEW COMPARISON:  09/03/2015 left knee radiographs FINDINGS: Moderate suprapatellar left knee joint effusion. A 1 cm round calcification medial and anterior to the left distal femoral shaft may represent a loose intra-articular body (this structure was seen lateral to the left distal femur on the 09/03/2015 radiographs). No  fracture, dislocation or suspicious focal osseous lesion. Diffuse osteopenia. Mild tricompartmental left knee osteoarthritis. No appreciable joint erosions. Vascular calcifications throughout the soft tissues posteriorly. IMPRESSION: 1. New moderate suprapatellar left knee joint effusion. 2. Probable 1 cm loose intra-articular osseous body in the suprapatellar left knee joint. 3. Mild tricompartmental osteoarthritis in the left knee. No left knee joint erosions. 4. Diffuse osteopenia. Electronically Signed   By: Ilona Sorrel M.D.   On: 02/17/2016 21:41   I have personally reviewed and evaluated these images and lab results as part of my medical decision-making.   EKG Interpretation   Date/Time:  Friday Feb 17 2016 20:47:00 EDT Ventricular Rate:  76 PR Interval:  161 QRS Duration: 94 QT Interval:  395 QTC Calculation: 444 R Axis:   -33 Text Interpretation:  Sinus rhythm Left axis deviation Confirmed by Congetta Odriscoll   MD, Itzae Mccurdy ([id number]) on 02/17/2016 10:11:30 PM      MDM   Final diagnoses:  Left arm weakness  Left leg weakness  Left knee pain    Will consult neurology and general medicine. Uncertain etiology of left-sided weakness. Additionally patient has a moderate suprapatellar left knee joint effusion. Dr. Roel Cluck to evaluate  0015:   Neurologist does not believe this is stroke related. Will start Cardene drip for blood pressure.  Nat Christen, MD 02/18/16 8182  Nat Christen, MD 02/18/16 [phone]

## 2016-02-18 NOTE — H&P (Signed)
[name] GEX:[id number] DOB: 29-Mar-1941 DOA: 02/17/2016    PCP: Benito Mccreedy, MD       Patient coming from: [facility]  Chief Complaint: Pain and left arm and left leg  HPI: [name] is a 75 y.o. female with medical history significant of end-stage renal disease hemodialysis on Tuesday Thursday and Saturday, multiple myeloma, right femur fracture, chronic leukopenia and anemia, GERD    Presented with left arm and left leg pain after dialysis needle was taken out. This was associated and mild nausea and vomiting 2 days ago but resolved today. Pain of left arm was associated with weakness. Reports some chills and subjective fever, no chest pain no shortness of breath. She had one episode of diarrhea.  Reports no injury no fall, no pain with urination, She reports feeling weaker on the left side since dialysis,  Regarding pertinent Chronic problems: History of hypertension been controlled on hydralazine and Norvasc Been dialyzed on Tuesday Thursday and Saturday last dialysis was Thursday, May 18 went well Patient has history of multiple myeloma currently not on therapy  IN ER: [facility] 2.2 BUN 39 creatinine 6.6 sodium 130  CT head unremarkable for acute findings but showing old bilateral basal ganglion infarcts Neurology consult has been called and felt the patient unlikely to have stroke but given BP 165/145  recommended Cardene drip to control significant hypertension was started in the emergency department BP now 147/81  Hospitalist was called for admission for her hypertensive urgency and left side weakness associated with pain  Review of Systems:    Pertinent positives include:  Chills, pain in left shoulder and left knee  Constitutional:  No weight loss, night sweats, Fevers, fatigue, weight loss  HEENT:  No headaches, Difficulty swallowing,Tooth/dental problems,Sore throat,  No sneezing, itching, ear ache, nasal congestion, post nasal drip,  Cardio-vascular:  No  chest pain, Orthopnea, PND, anasarca, dizziness, palpitations.no Bilateral lower extremity swelling  GI:  No heartburn, indigestion, abdominal pain, nausea, vomiting, diarrhea, change in bowel habits, loss of appetite, melena, blood in stool, hematemesis Resp:  no shortness of breath at rest. No dyspnea on exertion, No excess mucus, no productive cough, No non-productive cough, No coughing up of blood.No change in color of mucus.No wheezing. Skin:  no rash or lesions. No jaundice GU:  no dysuria, change in color of urine, no urgency or frequency. No straining to urinate.  No flank pain.  Musculoskeletal:  No joint pain or no joint swelling. No decreased range of motion. No back pain.  Psych:  No change in mood or affect. No depression or anxiety. No memory loss.  Neuro: no localizing neurological complaints, no tingling, no weakness, no double vision, no gait abnormality, no slurred speech, no confusion  As per HPI otherwise 10 point review of systems negative.   Past Medical History: Past Medical History  Diagnosis Date  . ESRD (end stage renal disease) ([place]) 03/11/2014  . Hypertension associatd with end stage renal disease on dialysis 03/11/2014  . GERD (gastroesophageal reflux disease) 04/23/2015  . Closed fracture of right distal femur ([place]) 09/01/2015  . Multiple myeloma ([place]) 03/11/2014  . Malnutrition of moderate degree 09/02/2015   Past Surgical History  Procedure Laterality Date  . Abdominal hysterectomy      Pt. denies     Social History:  Ambulatory   [place]    From facility [facility]   reports that she has never smoked. She has never used smokeless tobacco. She reports that she does not drink alcohol  or use illicit drugs.  Allergies:  No Known Allergies     Family History:    Family History  Problem Relation Age of Onset  . Hypertension Mother   . Hypertension Father     Medications: Prior to Admission medications   Medication Sig Start Date End Date  Taking? Authorizing Provider  Amino Acids-Protein Hydrolys (FEEDING SUPPLEMENT, PRO-STAT SUGAR FREE 64,) LIQD Take 30 mLs by mouth 2 (two) times daily.   Yes Historical Provider, MD  amLODipine (NORVASC) 10 MG tablet Take 0.5 tablets (5 mg total) by mouth daily. 03/21/15  Yes Josalyn Funches, MD  bisacodyl (DULCOLAX) 10 MG suppository Place 1 suppository (10 mg total) rectally daily as needed for moderate constipation. 09/07/15  Yes Theodis Blaze, MD  cinacalcet (SENSIPAR) 60 MG tablet Take 60 mg by mouth daily.   Yes Historical Provider, MD  furosemide (LASIX) 20 MG tablet Take 1 tablet (20 mg total) by mouth daily. PRN for leg edema Patient taking differently: Take 20 mg by mouth daily as needed for fluid or edema. PRN for leg edema 03/21/15  Yes Josalyn Funches, MD  hydrALAZINE (APRESOLINE) 10 MG tablet Take 1 tablet (10 mg total) by mouth 3 (three) times daily. 09/07/15  Yes Theodis Blaze, MD  HYDROcodone-acetaminophen (NORCO/VICODIN) 5-325 MG tablet Take 1 tablet by mouth every 6 hours as needed for moderate pain control. Take 2 tablets by mouth every 6 hours as needed for severe pain control. 11/22/15  Yes Estill Dooms, MD  isosorbide mononitrate (IMDUR) 60 MG 24 hr tablet Take 60 mg by mouth daily. 03/24/15  Yes Historical Provider, MD  lanthanum (FOSRENOL) 1000 MG chewable tablet Chew 2,000 mg by mouth 3 (three) times daily with meals. Take 2 tablets (=2026m) by mouth AC.   Yes Historical Provider, MD  magnesium hydroxide (MILK OF MAGNESIA) 400 MG/5ML suspension Take 30 mLs by mouth daily as needed for mild constipation.   Yes Historical Provider, MD  mirtazapine (REMERON SOL-TAB) 15 MG disintegrating tablet Take 15 mg by mouth at bedtime. Dissolve 1 tablet by mouth at bedtime for appetite.   Yes Historical Provider, MD  multivitamin (RENA-VIT) TABS tablet Take 1 tablet by mouth at bedtime. Patient taking differently: Take 1 tablet by mouth every morning.  04/13/15  Yes SAnnita Brod MD    ondansetron (ZOFRAN ODT) 4 MG disintegrating tablet Take 1 tablet (4 mg total) by mouth every 8 (eight) hours as needed for nausea or vomiting. 01/21/15  Yes AEverlene Balls MD  pantoprazole (PROTONIX) 40 MG tablet TAKE 1 TABLET BY MOUTH DAILY 09/06/15  Yes JBoykin Nearing MD  prochlorperazine (COMPAZINE) 10 MG tablet Take 1 tablet (10 mg total) by mouth every 6 (six) hours as needed (Nausea or vomiting). 03/25/14  Yes DConcha Norway MD  ranitidine (ZANTAC) 300 MG tablet Take 300 mg by mouth at bedtime.   Yes Historical Provider, MD  simethicone (MYLICON) 1383MG chewable tablet Take 1 tablet by mouth prior to each meal.   Yes Historical Provider, MD  sodium phosphate (FLEET) enema Place 1 enema rectally once. follow package directions   Yes Historical Provider, MD  valACYclovir (VALTREX) 500 MG tablet Take 500 mg by mouth daily.   Yes Historical Provider, MD  zolpidem (AMBIEN) 5 MG tablet Take 1 tablet (5 mg total) by mouth at bedtime as needed for sleep. 12/01/15  Yes TGayland Curry DO    Physical Exam: Patient Vitals for the past 24 hrs:  BP Temp Temp src Pulse  Resp SpO2 Height Weight  02/17/16 2315 (!) 167/146 mmHg - - 77 22 96 % - -  02/17/16 2300 (!) 170/144 mmHg - - 73 20 99 % - -  02/17/16 2245 (!) 168/153 mmHg - - 75 20 98 % - -  02/17/16 2230 (!) 159/138 mmHg - - 73 16 97 % - -  02/17/16 2056 145/92 mmHg - - 69 18 97 % - -  02/17/16 2049 - - - - - 95 % - -  02/17/16 2045 - 99.7 F (37.6 C) Oral - - - '5\' 7"'  (1.702 m) 62.143 kg (137 lb)    1. General:  in No Acute distress 2. Psychological: Alert and   Oriented 3. Head/ENT:   Moist   Mucous Membranes                          Head Non traumatic, neck supple                           Poor Dentition 4. SKIN:   decreased Skin turgor,  Skin clean Dry and intact no rash 5. Heart: Regular rate and rhythm no Murmur, Rub or gallop 6. Lungs: Clear to auscultation bilaterally, no wheezes or crackles   7. Abdomen: Soft, non-tender, Non  distended 8. Lower extremities: no clubbing, cyanosis, or edema 9. Neurologically Grossly intact, moving all 4 extremities equally, limited exam due to pain 10. MSK:  range of motion limited due to pain in Left shoulder and knee, grips equal   body mass index is 21.45 kg/(m^2).  Labs on Admission:   Labs on Admission: I have personally reviewed following labs and imaging studies  CBC:  Recent Labs Lab 02/17/16 2106  WBC 2.2*  NEUTROABS 1.6*  HGB 12.3  HCT 36.7  MCV 106.1*  PLT 170   Basic Metabolic Panel:  Recent Labs Lab 02/17/16 2150  NA 130*  K 5.2*  CL 92*  CO2 26  GLUCOSE 110*  BUN 39*  CREATININE 6.60*  CALCIUM 8.4*   GFR: Estimated Creatinine Clearance: 7.1 mL/min (by C-G formula based on Cr of 6.6). Liver Function Tests: No results for input(s): AST, ALT, ALKPHOS, BILITOT, PROT, ALBUMIN in the last 168 hours. No results for input(s): LIPASE, AMYLASE in the last 168 hours. No results for input(s): AMMONIA in the last 168 hours. Coagulation Profile: No results for input(s): INR, PROTIME in the last 168 hours. Cardiac Enzymes: No results for input(s): CKTOTAL, CKMB, CKMBINDEX, TROPONINI in the last 168 hours. BNP (last 3 results) No results for input(s): PROBNP in the last 8760 hours. HbA1C: No results for input(s): HGBA1C in the last 72 hours. CBG: No results for input(s): GLUCAP in the last 168 hours. Lipid Profile: No results for input(s): CHOL, HDL, LDLCALC, TRIG, CHOLHDL, LDLDIRECT in the last 72 hours. Thyroid Function Tests: No results for input(s): TSH, T4TOTAL, FREET4, T3FREE, THYROIDAB in the last 72 hours. Anemia Panel: No results for input(s): VITAMINB12, FOLATE, FERRITIN, TIBC, IRON, RETICCTPCT in the last 72 hours. Urine analysis:    Component Value Date/Time   COLORURINE YELLOW 04/10/2015 1800   APPEARANCEUR CLOUDY* 04/10/2015 1800   LABSPEC 1.008 04/10/2015 1800   PHURINE 8.0 04/10/2015 1800   GLUCOSEU 100* 04/10/2015 1800    HGBUR MODERATE* 04/10/2015 1800   BILIRUBINUR NEGATIVE 04/10/2015 1800   KETONESUR NEGATIVE 04/10/2015 1800   PROTEINUR 100* 04/10/2015 1800   UROBILINOGEN 0.2 04/10/2015 1800  NITRITE NEGATIVE 04/10/2015 1800   LEUKOCYTESUR MODERATE* 04/10/2015 1800   Sepsis Labs: '@LABRCNTIP' (procalcitonin:4,lacticidven:4) )No results found for this or any previous visit (from the past 240 hour(s)).     UA ordered patient still makes urine  No results found for: HGBA1C  Estimated Creatinine Clearance: 7.1 mL/min (by C-G formula based on Cr of 6.6).  BNP (last 3 results) No results for input(s): PROBNP in the last 8760 hours.   ECG REPORT  Independently reviewed Rate:76  Rhythm: NSR ST&T Change: No acute ischemic changes   QTC 444  Filed Weights   02/17/16 2045  Weight: 62.143 kg (137 lb)     Cultures:    Component Value Date/Time   SDES URINE, CLEAN CATCH 04/10/2015 1800   SPECREQUEST NONE 04/10/2015 1800   CULT  04/10/2015 1800    MULTIPLE SPECIES PRESENT, SUGGEST RECOLLECTION IF CLINICALLY INDICATED   REPTSTATUS 04/11/2015 FINAL 04/10/2015 1800     Radiological Exams on Admission: Ct Head Wo Contrast  02/17/2016  CLINICAL DATA:  Left arm pain, hypertensive, weakness, weight loss. EXAM: CT HEAD WITHOUT CONTRAST TECHNIQUE: Contiguous axial images were obtained from the base of the skull through the vertex without intravenous contrast. COMPARISON:  Head CT dated 04/10/2015. FINDINGS: Brain: There is mild generalized age-related brain atrophy with commensurate dilatation of the ventricles and sulci. Mild chronic small vessel ischemic changes noted within the deep periventricular white matter. Small old lacunar infarcts noted within the basal ganglia. There is no mass, hemorrhage, edema or other evidence of acute parenchymal abnormality. No extra-axial hemorrhage. Vascular: No hyperdense vessel or unexpected calcification. There are chronic calcified atherosclerotic changes of the large  vessels at the skull base. Skull: Negative for fracture or focal lesion. Sinuses/Orbits: Visualized upper paranasal sinuses are clear. Mastoid air cells are clear. Other: None. IMPRESSION: No acute findings.  No intracranial mass, hemorrhage or edema. Electronically Signed   By: Franki Cabot M.D.   On: 02/17/2016 21:58   Dg Knee Complete 4 Views Left  02/17/2016  CLINICAL DATA:  Left knee pain and swelling.  No reported injury. EXAM: LEFT KNEE - COMPLETE 4+ VIEW COMPARISON:  09/03/2015 left knee radiographs FINDINGS: Moderate suprapatellar left knee joint effusion. A 1 cm round calcification medial and anterior to the left distal femoral shaft may represent a loose intra-articular body (this structure was seen lateral to the left distal femur on the 09/03/2015 radiographs). No fracture, dislocation or suspicious focal osseous lesion. Diffuse osteopenia. Mild tricompartmental left knee osteoarthritis. No appreciable joint erosions. Vascular calcifications throughout the soft tissues posteriorly. IMPRESSION: 1. New moderate suprapatellar left knee joint effusion. 2. Probable 1 cm loose intra-articular osseous body in the suprapatellar left knee joint. 3. Mild tricompartmental osteoarthritis in the left knee. No left knee joint erosions. 4. Diffuse osteopenia. Electronically Signed   By: Ilona Sorrel M.D.   On: 02/17/2016 21:41    Chart has been reviewed    Assessment/Plan  75 y.o. female with medical history significant of end-stage renal disease hemodialysis on Tuesday Thursday and Saturday, multiple myeloma, right femur fracture, chronic leukopenia and anemia, GERD here with Left shoulder and knee pain, subjective fever and joint effusions  Present on Admission:  . ESRD (end stage renal disease) ([place]) -plan to [facility] tomorrow, left msg for nephrology . Multiple myeloma (HCC) - chronic, given pain in left shoulder and Knee will image for pathologic fractures, can obtain skeletal survey at a later  time if indicated. Will need to follow up with oncology regarding overall patient's  prognosis.  Left shoulder left knee pain - evidence of joint effusion, will obtain uric acid, plain imaging. Benefit from orthopedics consult to tap the joints for diagnostic purposes.  . Hypertensive urgency - will transition off nicardipine drip given BP drop to 120. Will restart home meds and give hydralazine PRN, if needed could initiate nitro drip, BP will likely come down after HD.  Fever - low grade will obtain UA and CXR given immuno suppression due to MM Other plan as per orders.  DVT prophylaxis:  SCD    Code Status:  FULL CODE  as per patient   Family Communication:   Family not at  Bedside    Disposition Plan:                              Back to current facility when stable                           Consults called: neurology, left msg to nephrology Admission status:   obs    Level of care    stepdown     I have spent a total of 61 min on this admission   Dakotah Heiman 02/18/2016, 1:41 AM    [facility]  Pager [phone]   after 2 AM please page floor coverage PA If 7AM-7PM, please contact the day team taking care of the patient  [email or web address]  Password [email or web address]

## 2016-02-18 NOTE — ED Notes (Signed)
Pt being transported upstairs now.
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
## 2016-02-18 NOTE — Progress Notes (Signed)
PROGRESS NOTE  [name]  ZDG:[id number] DOB: [birth year]  DOA: 02/17/2016 PCP: Benito Mccreedy, MD   Brief Narrative:  75 year old female with ESRD on TTS HD, multiple myeloma, HTN, GERD, anemia of chronic disease, malnutrition, presented to [facility] ED on 5/19 with complaints of left shoulder and knee pain which began after HD the day prior. Unable to clearly say if she was weak on that side. Also complained of nausea, vomiting and diarrhea. No acute issues as per OP dialysis staff. Neurology evaluated in ED and did not believe that she had a CVA. Markedly elevated blood pressure on arrival 171/151-briefly on Cardene drip which was weaned off when blood pressures improved. As per nephrology, history of behavioral issues and resident of SNF. X-rays of left shoulder and knee without fractures. Admitted for hypertensive urgency.   Assessment & Plan:   Active Problems:   ESRD (end stage renal disease) ([place])   Multiple myeloma ([facility])   Hypertension associatd with end stage renal disease on dialysis   Hypertensive urgency   Swelling of joint of left knee   Hypertensive urgency - Initial blood pressure 171/151. Treated briefly in the ED with Cardene drip and initiated her home oral antihypertensive regimen. Blood pressures improved and weaned off Cardene drip. - Continue amlodipine, hydralazine, Imdur. - Blood pressures starting to increase. Add when necessary IV hydralazine. Monitor closely. - Nephrology will plan to lower volume across HD.  Left shoulder/knee pain and? Weakness - CT head without acute findings. No obvious focal deficits. Patient not fully cooperating with our assessment-some of this may be related to pain. - X-rays of left shoulder without acute finding. X-ray of left knee shows joint effusion, probable loose body, tricompartmental osteoarthritis. Uric acid: Normal. Apart from slightly painful range of movements, no acute findings on physical exam. - Denies history of fall. -  ? All related to osteoarthritis. Supportive/symptomatic treatment and if it doesn't improve then consider orthopedic consultation. - Neurology consultation 5/20 appreciated: Do not think that she has stroke and field that her left arm dysfunction is related to joint or bony pain. Skeletal survey was recommended. No MRI was felt necessary.  ESRD on TTS HD/mild hyperkalemia - Nephrology consulted and plan HD today.  Pancytopenia - Probably multifactorial related to multiple myeloma and ESRD. Follow CBCs.  Relapsed and refractory multiple myeloma - No obvious fractures on x-rays of left shoulder and knee.  - Followed at [facility]. Reviewed last progress note from 02/01/16 in care everywhere: No signs or symptoms to suggest he sees progression. Patient determined to continue aggressive treatment. Chemotherapy was held on that day due to [facility] of 0.3 and she has had issues with neutropenia and requires G-CSF support. She received bortezomib, granix, cyclophosphamide on 5/17 at [facility].  Low-grade fever - Urine microscopy not suggestive of UTI. Chest x-ray without consolidation. Monitor  History of behavioral issues - Monitor.  Chronic hyponatremia - May be related to ESRD and multiple myeloma. Sodium seems to be at its baseline. Management per nephrology. Follow BMP.  DVT prophylaxis: SCDs Code Status: Full Family Communication: Discussed with patient. No family at bedside. Disposition Plan: Admitted to stepdown unit. DC to SNF when medically stable.   Consultants:   Nephrology  Procedures:   None  Antimicrobials:   None    Subjective: Seen in ED this morning. Complained of left shoulder and knee pain. Denies history of trauma. Unable to clearly say if she has any left-sided weakness. No other complaints reported. Patient was interviewed and  examined with her female RN in room.  Objective:  Filed Vitals:   02/18/16 1230 02/18/16 1300 02/18/16 1315 02/18/16 1440    BP:  149/87 152/98 170/111  Pulse: 72 77 74 73  Temp:    98.7 F (37.1 C)  TempSrc:    Oral  Resp: '15 16 20 20  ' Height:      Weight:      SpO2: 98% 96% 98% 99%   No intake or output data in the 24 hours ending 02/18/16 1533 Filed Weights   02/17/16 2045  Weight: 62.143 kg (137 lb)    Examination:  General exam: Pleasant elderly female sitting up comfortably in the gurney eating breakfast this morning. Respiratory system: Clear to auscultation. Respiratory effort normal. Cardiovascular system: S1 & S2 heard, RRR. No JVD, murmurs, rubs, gallops or clicks. No pedal edema. Telemetry: Sinus rhythm. Gastrointestinal system: Abdomen is nondistended, soft and nontender. No organomegaly or masses felt. Normal bowel sounds heard. Central nervous system: Alert and oriented. No focal neurological deficits. Extremities: Chronic boggy swelling of right knee later than signed left. Left knee with moderate swelling without increased warmth, redness or tenderness. Some painful range of movement/flexion. Left shoulder with questionable mild swelling without increased warmth or redness. Mild tenderness. Left upper extremity AV fistula. Normal power in right limbs. Unable to objectively evaluate left-sided power due to patient's reluctance from pain. Skin: No rashes, lesions or ulcers Psychiatry: Judgement and insight appear poor. Mood & affect appropriate.     Data Reviewed: I have personally reviewed following labs and imaging studies  CBC:  Recent Labs Lab 02/17/16 2106 02/18/16 0330  WBC 2.2* 2.2*  NEUTROABS 1.6*  --   HGB 12.3 11.3*  HCT 36.7 35.0*  MCV 106.1* 107.0*  PLT 164 341*   Basic Metabolic Panel:  Recent Labs Lab 02/17/16 2150 02/18/16 0330  NA 130* 128*  K 5.2* 5.3*  CL 92* 91*  CO2 26 26  GLUCOSE 110* 112*  BUN 39* 44*  CREATININE 6.60* 7.25*  CALCIUM 8.4* 9.0  MG  --  2.1  PHOS  --  6.4*   GFR: Estimated Creatinine Clearance: 6.4 mL/min (by C-G formula  based on Cr of 7.25). Liver Function Tests:  Recent Labs Lab 02/18/16 0330  AST 21  ALT 16  ALKPHOS 60  BILITOT 1.0  PROT 7.7  ALBUMIN 2.7*   No results for input(s): LIPASE, AMYLASE in the last 168 hours. No results for input(s): AMMONIA in the last 168 hours. Coagulation Profile: No results for input(s): INR, PROTIME in the last 168 hours. Cardiac Enzymes: No results for input(s): CKTOTAL, CKMB, CKMBINDEX, TROPONINI in the last 168 hours. BNP (last 3 results) No results for input(s): PROBNP in the last 8760 hours. HbA1C: No results for input(s): HGBA1C in the last 72 hours. CBG: No results for input(s): GLUCAP in the last 168 hours. Lipid Profile: No results for input(s): CHOL, HDL, LDLCALC, TRIG, CHOLHDL, LDLDIRECT in the last 72 hours. Thyroid Function Tests:  Recent Labs  02/18/16 0330  TSH 0.636   Anemia Panel: No results for input(s): VITAMINB12, FOLATE, FERRITIN, TIBC, IRON, RETICCTPCT in the last 72 hours.  Sepsis Labs: No results for input(s): PROCALCITON, LATICACIDVEN in the last 168 hours.  No results found for this or any previous visit (from the past 240 hour(s)).       Radiology Studies: Dg Chest 2 View  02/18/2016  CLINICAL DATA:  Fever. History of end-stage renal disease on dialysis, multiple myeloma. EXAM: CHEST  2 VIEW COMPARISON:  Chest radiograph September 03, 2015 FINDINGS: Cardiac silhouette is moderately enlarged. Tortuous calcified aorta. No pleural effusion or focal consolidation. Mildly elevated LEFT hemidiaphragm with strandy densities. No pneumothorax. Vascular clips project in LEFT humerus soft tissues. Patient's LEFT arm at side, limiting assessment of thoracic spine. IMPRESSION: Stable cardiomegaly.  LEFT lung base atelectasis. Electronically Signed   By: Elon Alas M.D.   On: 02/18/2016 02:31   Ct Head Wo Contrast  02/17/2016  CLINICAL DATA:  Left arm pain, hypertensive, weakness, weight loss. EXAM: CT HEAD WITHOUT CONTRAST  TECHNIQUE: Contiguous axial images were obtained from the base of the skull through the vertex without intravenous contrast. COMPARISON:  Head CT dated 04/10/2015. FINDINGS: Brain: There is mild generalized age-related brain atrophy with commensurate dilatation of the ventricles and sulci. Mild chronic small vessel ischemic changes noted within the deep periventricular white matter. Small old lacunar infarcts noted within the basal ganglia. There is no mass, hemorrhage, edema or other evidence of acute parenchymal abnormality. No extra-axial hemorrhage. Vascular: No hyperdense vessel or unexpected calcification. There are chronic calcified atherosclerotic changes of the large vessels at the skull base. Skull: Negative for fracture or focal lesion. Sinuses/Orbits: Visualized upper paranasal sinuses are clear. Mastoid air cells are clear. Other: None. IMPRESSION: No acute findings.  No intracranial mass, hemorrhage or edema. Electronically Signed   By: Franki Cabot M.D.   On: 02/17/2016 21:58   Dg Shoulder Left  02/18/2016  CLINICAL DATA:  Pt states she has been having left arm pain and weakness since her dialysis needle was removed on Thursday. Hx multiple myeloma. EXAM: LEFT SHOULDER - 2+ VIEW COMPARISON:  None. FINDINGS: No fracture.  No bone lesion. Glenohumeral joint and AC joints are normally aligned. No significant arthropathic change. Bones are demineralized. Surgical vascular clips project along the medial left arm. IMPRESSION: No fracture or dislocation.  No acute finding.  No bone lesion. Electronically Signed   By: Lajean Manes M.D.   On: 02/18/2016 10:31   Dg Knee Complete 4 Views Left  02/17/2016  CLINICAL DATA:  Left knee pain and swelling.  No reported injury. EXAM: LEFT KNEE - COMPLETE 4+ VIEW COMPARISON:  09/03/2015 left knee radiographs FINDINGS: Moderate suprapatellar left knee joint effusion. A 1 cm round calcification medial and anterior to the left distal femoral shaft may represent a  loose intra-articular body (this structure was seen lateral to the left distal femur on the 09/03/2015 radiographs). No fracture, dislocation or suspicious focal osseous lesion. Diffuse osteopenia. Mild tricompartmental left knee osteoarthritis. No appreciable joint erosions. Vascular calcifications throughout the soft tissues posteriorly. IMPRESSION: 1. New moderate suprapatellar left knee joint effusion. 2. Probable 1 cm loose intra-articular osseous body in the suprapatellar left knee joint. 3. Mild tricompartmental osteoarthritis in the left knee. No left knee joint erosions. 4. Diffuse osteopenia. Electronically Signed   By: Ilona Sorrel M.D.   On: 02/17/2016 21:41        Scheduled Meds: . amLODipine  5 mg Oral Daily  . calcium acetate  667 mg Oral TID WC  . cinacalcet  60 mg Oral Q breakfast  . doxercalciferol  4 mcg Intravenous Q T,Th,Sa-HD  . famotidine  10 mg Oral Daily  . feeding supplement (PRO-STAT SUGAR FREE 64)  30 mL Oral BID  . hydrALAZINE  10 mg Oral TID  . isosorbide mononitrate  60 mg Oral Daily  . lanthanum  2,000 mg Oral TID WC  . mirtazapine  15 mg Oral QHS  .  multivitamin  1 tablet Oral QHS  . pantoprazole  40 mg Oral Daily  . sodium chloride flush  3 mL Intravenous Q12H  . sodium chloride flush  3 mL Intravenous Q12H  . valACYclovir  500 mg Oral Daily   Continuous Infusions:     LOS: 0 days    Time spent: 40 minutes.    [facility], MD [facility] Pager [phone]  If 7PM-7AM, please contact night-coverage [email or web address] Password [facility] 02/18/2016, 3:33 PM

## 2016-02-18 NOTE — ED Notes (Signed)
Gave pt water per Jarrett Soho, RN

## 2016-02-18 NOTE — ED Notes (Signed)
Taken to xray at this time. 

## 2016-02-18 NOTE — ED Notes (Signed)
Transported to xray at this time

## 2016-02-18 NOTE — ED Notes (Signed)
Pt refused meal tray. She states, "I have no appetite. I need to eat to gain weight, but I just don't want to." Pt is tearful.

## 2016-02-18 NOTE — ED Notes (Signed)
Admitting at bedside 

## 2016-02-18 NOTE — ED Notes (Signed)
Pt had a "loose bowel movement". Pt's bed pad and brief were changed.

## 2016-02-18 NOTE — Consult Note (Signed)
Reason: Left arm weakness  HPI:  84 AAF with history of Multiple Myeloma with resultant ESRD on HD T, R, S.  She noticed left arm weakness and pain on Thursday after dialysis.  No major event during dialysis.    CBC is normal except for WBC 2.2.  BMP is normal except Na 130 BUN 39 Cr 6.60 GFR 5  ECG is NSR.  CT Brain was reviewed and there are old bilateral basal ganglial infarcts and periventricular small vessel ischemic disease, but no hemorrhage.    BP 167/146 consistently high. P 77 T 99 Awake , alert, full oriented.    Face symmetrical.  No language impairment.  Right arm and both legs normal in strength.  Left arm has AV fistula.  There is pain in the left shoulder joint and scapula with any left arm movement and limits strength testing.    No babinski or hoffman's. Reflexes diffusely +1.    A/P:  I don't think she has had a stroke or this is stroke in progress.  The left arm dysfunction appears to be mainly related to joint or bone pain.  This may be multiple myeloma related bone involvement.  I recommend a complete skeletal survey to assess.    She has extremely high BP and is at risk of ICH or ischemic stroke.  I recommend a cardene gtt 5 mg/hr titrating to keep SBP <150 and DBP < 90.  We don't want to decrease perfusion too greatly too fast.    I don't think MRI is necessary, unless skeletal survey is normal and cause remains unknown.

## 2016-02-19 DIAGNOSIS — R531 Weakness: Secondary | ICD-10-CM | POA: Diagnosis present

## 2016-02-19 DIAGNOSIS — I160 Hypertensive urgency: Secondary | ICD-10-CM

## 2016-02-19 DIAGNOSIS — N390 Urinary tract infection, site not specified: Secondary | ICD-10-CM

## 2016-02-19 LAB — CBC
HCT: 34.4 % — ABNORMAL LOW (ref 36.0–46.0)
HEMOGLOBIN: 11.3 g/dL — AB (ref 12.0–15.0)
MCH: 35 pg — ABNORMAL HIGH (ref 26.0–34.0)
MCHC: 32.8 g/dL (ref 30.0–36.0)
MCV: 106.5 fL — ABNORMAL HIGH (ref 78.0–100.0)
Platelets: 164 10*3/uL (ref 150–400)
RBC: 3.23 MIL/uL — ABNORMAL LOW (ref 3.87–5.11)
RDW: 14.5 % (ref 11.5–15.5)
WBC: 0.8 10*3/uL — AB (ref 4.0–10.5)

## 2016-02-19 LAB — BASIC METABOLIC PANEL
ANION GAP: 12 (ref 5–15)
BUN: 20 mg/dL (ref 6–20)
CALCIUM: 8 mg/dL — AB (ref 8.9–10.3)
CHLORIDE: 93 mmol/L — AB (ref 101–111)
CO2: 26 mmol/L (ref 22–32)
CREATININE: 4.29 mg/dL — AB (ref 0.44–1.00)
GFR calc non Af Amer: 9 mL/min — ABNORMAL LOW (ref >60)
GFR, EST AFRICAN AMERICAN: 11 mL/min — AB (ref >60)
Glucose, Bld: 109 mg/dL — ABNORMAL HIGH (ref 65–99)
Potassium: 4.4 mmol/L (ref 3.5–5.1)
SODIUM: 131 mmol/L — AB (ref 135–145)

## 2016-02-19 LAB — BLOOD CULTURE ID PANEL (REFLEXED)
Acinetobacter baumannii: NOT DETECTED
CANDIDA TROPICALIS: NOT DETECTED
CARBAPENEM RESISTANCE: NOT DETECTED
Candida albicans: NOT DETECTED
Candida glabrata: NOT DETECTED
Candida krusei: NOT DETECTED
Candida parapsilosis: NOT DETECTED
ENTEROCOCCUS SPECIES: NOT DETECTED
Enterobacter cloacae complex: NOT DETECTED
Enterobacteriaceae species: NOT DETECTED
Escherichia coli: NOT DETECTED
HAEMOPHILUS INFLUENZAE: NOT DETECTED
Klebsiella oxytoca: NOT DETECTED
Klebsiella pneumoniae: NOT DETECTED
LISTERIA MONOCYTOGENES: NOT DETECTED
METHICILLIN RESISTANCE: NOT DETECTED
Neisseria meningitidis: NOT DETECTED
PROTEUS SPECIES: NOT DETECTED
Pseudomonas aeruginosa: NOT DETECTED
SERRATIA MARCESCENS: NOT DETECTED
STAPHYLOCOCCUS AUREUS BCID: NOT DETECTED
STAPHYLOCOCCUS SPECIES: NOT DETECTED
STREPTOCOCCUS PYOGENES: NOT DETECTED
Streptococcus agalactiae: DETECTED — AB
Streptococcus pneumoniae: NOT DETECTED
Streptococcus species: DETECTED — AB
VANCOMYCIN RESISTANCE: NOT DETECTED

## 2016-02-19 LAB — URINE CULTURE

## 2016-02-19 MED ORDER — DEXTROSE 5 % IV SOLN
2.0000 g | INTRAVENOUS | Status: DC
  Administered 2016-02-19: 2 g via INTRAVENOUS
  Filled 2016-02-19: qty 2

## 2016-02-19 MED ORDER — CEFAZOLIN SODIUM-DEXTROSE 2-4 GM/100ML-% IV SOLN
2.0000 g | INTRAVENOUS | Status: DC
  Administered 2016-02-21: 2 g via INTRAVENOUS
  Filled 2016-02-19 (×3): qty 100

## 2016-02-19 NOTE — Progress Notes (Signed)
CRITICAL VALUE ALERT  Critical value received:  WBC 0.8  Date of notification: 02/19/2016  Time of notification:05:07  Critical value read back:Yes.    Nurse who received alert:  Estanislado Pandy  MD notified (1st page): T.Callahan, NP  Time of first page:  06:09  MD notified (2nd page):  Time of second page:  Responding MD:  Fredirick Maudlin, NP   Time MD responded:  06:15

## 2016-02-19 NOTE — Progress Notes (Signed)
Pt had moderate amount of emesis when I happened to be in the room. I was unable to measure it. Pt reports feeling better after having thrown up. Emesis yellow in color. Suction set up in room and pts HOB left at 30 degrees for aspiration precautions. Will closely monitor. Dr. Lavella Lemons made aware. Pt denies stomach pain, no abdominal distention noted.

## 2016-02-19 NOTE — Progress Notes (Signed)
Notified by Saralyn Pilar at [place] had a short burst of Non Sustained SVT. See saved strip. Jessie Foot, RN

## 2016-02-19 NOTE — Progress Notes (Signed)
[facility] Progress Note   Subjective: no c/o's,  "I think I did too much rehab at [facility]!" shoulder remain sore and painful to move the arms   Filed Vitals:   02/19/16 0521 02/19/16 0530 02/19/16 0730 02/19/16 0755  BP: 161/143 168/154 117/82 115/80  Pulse: 83   84  Temp: 98.2 F (36.8 C)   98.5 F (36.9 C)  TempSrc: Oral   Oral  Resp: 22 14 31 30   Height:      Weight:      SpO2: 98%   99%    Inpatient medications: . amLODipine  5 mg Oral Daily  . calcium acetate  667 mg Oral TID WC  . ceFEPime (MAXIPIME) IV  2 g Intravenous Q T,Th,Sat-1800  . cinacalcet  60 mg Oral Q breakfast  . doxercalciferol  4 mcg Intravenous Q T,Th,Sa-HD  . famotidine  10 mg Oral Daily  . feeding supplement (PRO-STAT SUGAR FREE 64)  30 mL Oral BID  . hydrALAZINE  10 mg Oral TID  . isosorbide mononitrate  60 mg Oral Daily  . lanthanum  2,000 mg Oral TID WC  . mirtazapine  15 mg Oral QHS  . multivitamin  1 tablet Oral QHS  . pantoprazole  40 mg Oral Daily  . sodium chloride flush  3 mL Intravenous Q12H  . sodium chloride flush  3 mL Intravenous Q12H  . valACYclovir  500 mg Oral Daily     sodium chloride, acetaminophen **OR** acetaminophen, bisacodyl, hydrALAZINE, HYDROcodone-acetaminophen, ondansetron **OR** ondansetron (ZOFRAN) IV, prochlorperazine, sodium chloride flush, zolpidem  Exam: Alert, no distress No jvd Chest clear bilat to bases RRR 2/6 sem Abd soft ntnd Ext no LE edema; both shoulder painful w palpation L> R Neuro limited movement L > R UE prob due to joint pain LUA AVF +bruit Alert, Ox 3  Dialysis: TTS SW  3h 3min  57kg  2/2.25 bath   Hep none  LUA AVF Aranesp 60 mcg IV weekly (last dose 02/16/16 HGB 11.1) Hectoral 4 mcg IV q treatment (Ca 7.7 C Ca 8.2 PTH 396 01/26/16 phos 5.7 02/09/16)   Assessment: 1. Neutropenia/ fevers - started IV cefipime today 2. L sided weakness - felt to be due to pain/ MS origin.  3. HTN'sive urgency - improved 4. ESRD  - TTS HD 5. Vol - is 2kg + today 6. Anemia - HGB 11.3 No ESA for now.  7. Metabolic bone disease - Continue binders, VDRA, sensipar 8. Nutrition - Albumin 2.7. Renal diet/prostat/renal vit 9. Myeloma- recent chemoRx at [facility] 4-5 d ago  Plan - BCx, IV abx , HD tts   Kelly Splinter MD [place] pager [phone]    cell [phone] 02/19/2016, 11:06 AM    Recent Labs Lab 02/17/16 2150 02/18/16 0330 02/19/16 0427  NA 130* 128* 131*  K 5.2* 5.3* 4.4  CL 92* 91* 93*  CO2 26 26 26   GLUCOSE 110* 112* 109*  BUN 39* 44* 20  CREATININE 6.60* 7.25* 4.29*  CALCIUM 8.4* 9.0 8.0*  PHOS  --  6.4*  --     Recent Labs Lab 02/18/16 0330  AST 21  ALT 16  ALKPHOS 60  BILITOT 1.0  PROT 7.7  ALBUMIN 2.7*    Recent Labs Lab 02/17/16 2106 02/18/16 0330 02/19/16 0427  WBC 2.2* 2.2* 0.8*  NEUTROABS 1.6*  --   --   HGB 12.3 11.3* 11.3*  HCT 36.7 35.0* 34.4*  MCV 106.1* 107.0* 106.5*  PLT 164 131*  164      

## 2016-02-19 NOTE — Progress Notes (Signed)
Per report, pt having much difficulty and extreme weakness moving all extremities, upon attempting to give patient her scheduled meds she stated she couldn't hold the cup due to "the weakness". However when patients cell phone rang, while I was giving her medication, I observed as  she reached over and picked up her cell phone with her L hand/arm and held the phone up to her ear for approx 10 minutes while talking to her daughter. Jessie Foot, RN

## 2016-02-19 NOTE — Progress Notes (Signed)
PHARMACY - PHYSICIAN COMMUNICATION CRITICAL VALUE ALERT - BLOOD CULTURE IDENTIFICATION (BCID)  Results for orders placed or performed during the hospital encounter of 02/17/16  Blood Culture ID Panel (Reflexed) (Collected: 02/18/2016  9:50 PM)  Result Value Ref Range   Enterococcus species NOT DETECTED NOT DETECTED   Vancomycin resistance NOT DETECTED NOT DETECTED   Listeria monocytogenes NOT DETECTED NOT DETECTED   Staphylococcus species NOT DETECTED NOT DETECTED   Staphylococcus aureus NOT DETECTED NOT DETECTED   Methicillin resistance NOT DETECTED NOT DETECTED   Streptococcus species DETECTED (A) NOT DETECTED   Streptococcus agalactiae DETECTED (A) NOT DETECTED   Streptococcus pneumoniae NOT DETECTED NOT DETECTED   Streptococcus pyogenes NOT DETECTED NOT DETECTED   Acinetobacter baumannii NOT DETECTED NOT DETECTED   Enterobacteriaceae species NOT DETECTED NOT DETECTED   Enterobacter cloacae complex NOT DETECTED NOT DETECTED   Escherichia coli NOT DETECTED NOT DETECTED   Klebsiella oxytoca NOT DETECTED NOT DETECTED   Klebsiella pneumoniae NOT DETECTED NOT DETECTED   Proteus species NOT DETECTED NOT DETECTED   Serratia marcescens NOT DETECTED NOT DETECTED   Carbapenem resistance NOT DETECTED NOT DETECTED   Haemophilus influenzae NOT DETECTED NOT DETECTED   Neisseria meningitidis NOT DETECTED NOT DETECTED   Pseudomonas aeruginosa NOT DETECTED NOT DETECTED   Candida albicans NOT DETECTED NOT DETECTED   Candida glabrata NOT DETECTED NOT DETECTED   Candida krusei NOT DETECTED NOT DETECTED   Candida parapsilosis NOT DETECTED NOT DETECTED   Candida tropicalis NOT DETECTED NOT DETECTED    Name of physician (or Provider) Contacted: Dr. Algis Liming  Changes to prescribed antibiotics required: No, but we are going to narrow cefepime to Ancef.   Onnie Boer, PharmD Pager: [phone] 02/19/2016 11:18 AM

## 2016-02-19 NOTE — Progress Notes (Signed)
Interval History:                                                                                                                      [name] is an 75 y.o. female patient with  subjective right sided weakness symptoms, not cooperative with examination. Per nursing staff report, she had fluctuating weakness, tangential speech not cooperative with her ADLs at times. 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 Patient unable to provide any clear history. No family members available to corroborate her history. She was face is seen by Dr. Orlena Sheldon for  neurology consultation, and her presentation was not believed to be secondary to an acute cerebral vascular event.    Past Medical History: Past Medical History  Diagnosis Date  . ESRD (end stage renal disease) ([place]) 03/11/2014  . Hypertension associatd with end stage renal disease on dialysis 03/11/2014  . GERD (gastroesophageal reflux disease) 04/23/2015  . Closed fracture of right distal femur ([place]) 09/01/2015  . Multiple myeloma ([place]) 03/11/2014  . Malnutrition of moderate degree 09/02/2015    Past Surgical History  Procedure Laterality Date  . Abdominal hysterectomy      Pt. denies    Family History: Family History  Problem Relation Age of Onset  . Hypertension Mother   . Hypertension Father     Social History:   reports that she has never smoked. She has never used smokeless tobacco. She reports that she does not drink alcohol or use illicit drugs.  Allergies:  No Known Allergies   Medications:                                                                                                                         Current facility-administered medications:  .  0.9 %  sodium chloride infusion, 250 mL, Intravenous, PRN, Toy Baker, MD .  acetaminophen (TYLENOL) tablet 650 mg, 650 mg, Oral, Q6H PRN, 650 mg at 02/19/16 0514 **OR** acetaminophen (TYLENOL) suppository 650 mg, 650 mg, Rectal, Q6H PRN, Toy Baker, MD, 650 mg at 02/19/16  1228 .  amLODipine (NORVASC) tablet 5 mg, 5 mg, Oral, Daily, Toy Baker, MD, 5 mg at 02/19/16 0942 .  bisacodyl (DULCOLAX) suppository 10 mg, 10 mg, Rectal, Daily PRN, Toy Baker, MD .  calcium acetate (PHOSLO) capsule 667 mg, 667 mg, Oral, TID WC, Valentina Gu, NP, 667 mg at 02/19/16 0942 .  [START ON 02/21/2016] ceFAZolin (ANCEF) IVPB 2g/100 mL premix, 2 g, Intravenous, Q T,Th,Sa-HD, Modena Jansky, MD .  cinacalcet (SENSIPAR) tablet 60 mg, 60 mg, Oral, Q breakfast, Toy Baker, MD, 60 mg at 02/19/16 0939 .  doxercalciferol (HECTOROL) injection 4 mcg, 4 mcg, Intravenous, Q T,Th,Sa-HD, Valentina Gu, NP, 4 mcg at 02/18/16 2344 .  famotidine (PEPCID) tablet 10 mg, 10 mg, Oral, Daily, Toy Baker, MD, 10 mg at 02/19/16 0942 .  feeding supplement (PRO-STAT SUGAR FREE 64) liquid 30 mL, 30 mL, Oral, BID, Toy Baker, MD, 30 mL at 02/19/16 0942 .  hydrALAZINE (APRESOLINE) injection 10 mg, 10 mg, Intravenous, Q4H PRN, Toy Baker, MD, 10 mg at 02/19/16 0534 .  hydrALAZINE (APRESOLINE) tablet 10 mg, 10 mg, Oral, TID, Toy Baker, MD, 10 mg at 02/19/16 1659 .  HYDROcodone-acetaminophen (NORCO/VICODIN) 5-325 MG per tablet 1 tablet, 1 tablet, Oral, Q6H PRN, Modena Jansky, MD, 1 tablet at 02/19/16 0511 .  isosorbide mononitrate (IMDUR) 24 hr tablet 60 mg, 60 mg, Oral, Daily, Toy Baker, MD, 60 mg at 02/19/16 0943 .  lanthanum (FOSRENOL) chewable tablet 2,000 mg, 2,000 mg, Oral, TID WC, Toy Baker, MD, 2,000 mg at 02/19/16 1151 .  mirtazapine (REMERON SOL-TAB) disintegrating tablet 15 mg, 15 mg, Oral, QHS, Toy Baker, MD, 15 mg at 02/19/16 0119 .  multivitamin (RENA-VIT) tablet 1 tablet, 1 tablet, Oral, QHS, Valentina Gu, NP, 1 tablet at 02/18/16 2200 .  ondansetron (ZOFRAN) tablet 4 mg, 4 mg, Oral, Q6H PRN **OR** ondansetron (ZOFRAN) injection 4 mg, 4 mg, Intravenous, Q6H PRN, Toy Baker, MD .   pantoprazole (PROTONIX) EC tablet 40 mg, 40 mg, Oral, Daily, Toy Baker, MD, 40 mg at 02/19/16 0942 .  prochlorperazine (COMPAZINE) tablet 10 mg, 10 mg, Oral, Q6H PRN, Toy Baker, MD .  sodium chloride flush (NS) 0.9 % injection 3 mL, 3 mL, Intravenous, Q12H, Toy Baker, MD, 3 mL at 02/19/16 0943 .  sodium chloride flush (NS) 0.9 % injection 3 mL, 3 mL, Intravenous, Q12H, Toy Baker, MD, 3 mL at 02/18/16 2200 .  sodium chloride flush (NS) 0.9 % injection 3 mL, 3 mL, Intravenous, PRN, Toy Baker, MD .  valACYclovir (VALTREX) tablet 500 mg, 500 mg, Oral, Daily, Toy Baker, MD, 500 mg at 02/19/16 0942 .  zolpidem (AMBIEN) tablet 5 mg, 5 mg, Oral, QHS PRN, Toy Baker, MD   Neurologic Examination:                                                                                                     Today's Vitals   02/19/16 1143 02/19/16 1224 02/19/16 1410 02/19/16 1600  BP: 146/91   112/76  Pulse: 94     Temp:  102.3 F (39.1 C) 101.7 F (38.7 C) 99.6 F (37.6 C)  TempSrc:  Oral Oral Oral  Resp: 25     Height:      Weight:      SpO2: 93%     PainSc:        Evaluation of higher integrative functions including: Level of alertness: Alert,  Oriented to time, place and person Speech: fluent, no evidence of dysarthria or aphasia noted.  Test the following  cranial nerves: 2-12 grossly intact Motor examination: Not cooperative for examination, she is able to elevate her all 4 extremities antigravity with repeated prompting, but was not cooperative for resistance testing. Normal tone,  And bulk.  Examination of sensation : Reports symmetric sensation proximally in all 4 extremities  Test coordination: No abnormal involuntary movements or tremors noted.      Lab Results: Basic Metabolic Panel:  Recent Labs Lab 02/17/16 2150 02/18/16 0330 02/19/16 0427  NA 130* 128* 131*  K 5.2* 5.3* 4.4  CL 92* 91* 93*  CO2 _0 GLUCOSE  110* 112* 109*  BUN 39* 44* 20  CREATININE 6.60* 7.25* 4.29*  CALCIUM 8.4* 9.0 8.0*  MG  --  2.1  --   PHOS  --  6.4*  --     Liver Function Tests:  Recent Labs Lab 02/18/16 0330  AST 21  ALT 16  ALKPHOS 60  BILITOT 1.0  PROT 7.7  ALBUMIN 2.7*   No results for input(s): LIPASE, AMYLASE in the last 168 hours. No results for input(s): AMMONIA in the last 168 hours.  CBC:  Recent Labs Lab 02/17/16 2106 02/18/16 0330 02/19/16 0427  WBC 2.2* 2.2* 0.8*  NEUTROABS 1.6*  --   --   HGB 12.3 11.3* 11.3*  HCT 36.7 35.0* 34.4*  MCV 106.1* 107.0* 106.5*  PLT 164 131* 164    Cardiac Enzymes: No results for input(s): CKTOTAL, CKMB, CKMBINDEX, TROPONINI in the last 168 hours.  Lipid Panel: No results for input(s): CHOL, TRIG, HDL, CHOLHDL, VLDL, LDLCALC in the last 168 hours.  CBG: No results for input(s): GLUCAP in the last 168 hours.  Microbiology: Results for orders placed or performed during the hospital encounter of 02/17/16  Urine culture     Status: Abnormal   Collection Time: 02/18/16  2:45 AM  Result Value Ref Range Status   Specimen Description URINE, CLEAN CATCH  Final   Special Requests NONE  Final   Culture (A)  Final    >=100,000 COLONIES/mL GROUP B STREP(S.AGALACTIAE)ISOLATED TESTING AGAINST S. AGALACTIAE NOT ROUTINELY PERFORMED DUE TO PREDICTABILITY OF AMP/PEN/VAN SUSCEPTIBILITY.    Report Status 02/19/2016 FINAL  Final  MRSA PCR Screening     Status: None   Collection Time: 02/18/16  6:29 PM  Result Value Ref Range Status   MRSA by PCR NEGATIVE NEGATIVE Final    Comment:        The GeneXpert MRSA Assay (FDA approved for NASAL specimens only), is one component of a comprehensive MRSA colonization surveillance program. It is not intended to diagnose MRSA infection nor to guide or monitor treatment for MRSA infections.   Culture, blood (routine x 2)     Status: None (Preliminary result)   Collection Time: 02/18/16  9:40 PM  Result Value Ref  Range Status   Specimen Description BLOOD HEMODIALYSIS CATHETER  Final   Special Requests BOTTLES DRAWN AEROBIC AND ANAEROBIC 10CC  Final   Culture  Setup Time   Final    GRAM POSITIVE COCCI IN CHAINS IN BOTH AEROBIC AND ANAEROBIC BOTTLES CRITICAL RESULT CALLED TO, READ BACK BY AND VERIFIED WITH: C DAVIS,RN AT 1144 02/19/16 BY L BENFIELD    Culture PENDING  Incomplete   Report Status PENDING  Incomplete  Culture, blood (routine x 2)     Status: None (Preliminary result)   Collection Time: 02/18/16  9:50 PM  Result Value Ref Range Status   Specimen Description BLOOD HEMODIALYSIS CATHETER  Final   Special  Requests BOTTLES DRAWN AEROBIC AND ANAEROBIC 10CC   Final   Culture  Setup Time   Final    GRAM POSITIVE COCCI IN CHAINS IN BOTH AEROBIC AND ANAEROBIC BOTTLES Organism ID to follow CRITICAL RESULT CALLED TO, READ BACK BY AND VERIFIED WITH: Beulah Gandy D AT 1102 02/19/16 BY L BENFIELD    Culture NO GROWTH < 24 HOURS  Final   Report Status PENDING  Incomplete  Blood Culture ID Panel (Reflexed)     Status: Abnormal   Collection Time: 02/18/16  9:50 PM  Result Value Ref Range Status   Enterococcus species NOT DETECTED NOT DETECTED Final   Vancomycin resistance NOT DETECTED NOT DETECTED Final   Listeria monocytogenes NOT DETECTED NOT DETECTED Final   Staphylococcus species NOT DETECTED NOT DETECTED Final   Staphylococcus aureus NOT DETECTED NOT DETECTED Final   Methicillin resistance NOT DETECTED NOT DETECTED Final   Streptococcus species DETECTED (A) NOT DETECTED Final    Comment: CRITICAL RESULT CALLED TO, READ BACK BY AND VERIFIED WITH: Beulah Gandy D AT 1102 02/19/16 BY L BENFIELD    Streptococcus agalactiae DETECTED (A) NOT DETECTED Final    Comment: CRITICAL RESULT CALLED TO, READ BACK BY AND VERIFIED WITH: Beulah Gandy D AT 1102 02/19/16 BY L BENFIELD    Streptococcus pneumoniae NOT DETECTED NOT DETECTED Final   Streptococcus pyogenes NOT DETECTED NOT DETECTED Final    Acinetobacter baumannii NOT DETECTED NOT DETECTED Final   Enterobacteriaceae species NOT DETECTED NOT DETECTED Final   Enterobacter cloacae complex NOT DETECTED NOT DETECTED Final   Escherichia coli NOT DETECTED NOT DETECTED Final   Klebsiella oxytoca NOT DETECTED NOT DETECTED Final   Klebsiella pneumoniae NOT DETECTED NOT DETECTED Final   Proteus species NOT DETECTED NOT DETECTED Final   Serratia marcescens NOT DETECTED NOT DETECTED Final   Carbapenem resistance NOT DETECTED NOT DETECTED Final   Haemophilus influenzae NOT DETECTED NOT DETECTED Final   Neisseria meningitidis NOT DETECTED NOT DETECTED Final   Pseudomonas aeruginosa NOT DETECTED NOT DETECTED Final   Candida albicans NOT DETECTED NOT DETECTED Final   Candida glabrata NOT DETECTED NOT DETECTED Final   Candida krusei NOT DETECTED NOT DETECTED Final   Candida parapsilosis NOT DETECTED NOT DETECTED Final   Candida tropicalis NOT DETECTED NOT DETECTED Final    Imaging: Dg Chest 2 View  02/18/2016  CLINICAL DATA:  Fever. History of end-stage renal disease on dialysis, multiple myeloma. EXAM: CHEST  2 VIEW COMPARISON:  Chest radiograph September 03, 2015 FINDINGS: Cardiac silhouette is moderately enlarged. Tortuous calcified aorta. No pleural effusion or focal consolidation. Mildly elevated LEFT hemidiaphragm with strandy densities. No pneumothorax. Vascular clips project in LEFT humerus soft tissues. Patient's LEFT arm at side, limiting assessment of thoracic spine. IMPRESSION: Stable cardiomegaly.  LEFT lung base atelectasis. Electronically Signed   By: Elon Alas M.D.   On: 02/18/2016 02:31   Ct Head Wo Contrast  02/17/2016  CLINICAL DATA:  Left arm pain, hypertensive, weakness, weight loss. EXAM: CT HEAD WITHOUT CONTRAST TECHNIQUE: Contiguous axial images were obtained from the base of the skull through the vertex without intravenous contrast. COMPARISON:  Head CT dated 04/10/2015. FINDINGS: Brain: There is mild generalized  age-related brain atrophy with commensurate dilatation of the ventricles and sulci. Mild chronic small vessel ischemic changes noted within the deep periventricular white matter. Small old lacunar infarcts noted within the basal ganglia. There is no mass, hemorrhage, edema or other evidence of acute parenchymal abnormality. No extra-axial hemorrhage. Vascular: No hyperdense  vessel or unexpected calcification. There are chronic calcified atherosclerotic changes of the large vessels at the skull base. Skull: Negative for fracture or focal lesion. Sinuses/Orbits: Visualized upper paranasal sinuses are clear. Mastoid air cells are clear. Other: None. IMPRESSION: No acute findings.  No intracranial mass, hemorrhage or edema. Electronically Signed   By: Franki Cabot M.D.   On: 02/17/2016 21:58   Dg Shoulder Left  02/18/2016  CLINICAL DATA:  Pt states she has been having left arm pain and weakness since her dialysis needle was removed on Thursday. Hx multiple myeloma. EXAM: LEFT SHOULDER - 2+ VIEW COMPARISON:  None. FINDINGS: No fracture.  No bone lesion. Glenohumeral joint and AC joints are normally aligned. No significant arthropathic change. Bones are demineralized. Surgical vascular clips project along the medial left arm. IMPRESSION: No fracture or dislocation.  No acute finding.  No bone lesion. Electronically Signed   By: Lajean Manes M.D.   On: 02/18/2016 10:31   Dg Knee Complete 4 Views Left  02/17/2016  CLINICAL DATA:  Left knee pain and swelling.  No reported injury. EXAM: LEFT KNEE - COMPLETE 4+ VIEW COMPARISON:  09/03/2015 left knee radiographs FINDINGS: Moderate suprapatellar left knee joint effusion. A 1 cm round calcification medial and anterior to the left distal femoral shaft may represent a loose intra-articular body (this structure was seen lateral to the left distal femur on the 09/03/2015 radiographs). No fracture, dislocation or suspicious focal osseous lesion. Diffuse osteopenia. Mild  tricompartmental left knee osteoarthritis. No appreciable joint erosions. Vascular calcifications throughout the soft tissues posteriorly. IMPRESSION: 1. New moderate suprapatellar left knee joint effusion. 2. Probable 1 cm loose intra-articular osseous body in the suprapatellar left knee joint. 3. Mild tricompartmental osteoarthritis in the left knee. No left knee joint erosions. 4. Diffuse osteopenia. Electronically Signed   By: Ilona Sorrel M.D.   On: 02/17/2016 21:41    Assessment and plan:   [name] is an 75 y.o. female patient with unreliable examination due to poor cooperation, suspect psychogenic symptoms. Nevertheless, recommend completing a brain and cervical spine MRI studies to conclusively rule out pathology contributing to her subjective symptoms of right-sided weakness as it is difficult to conclusively exclude pathology based on her unreliable examination as she is not cooperative.   If MRI studies show any abnormalities, please call for further follow-up.

## 2016-02-19 NOTE — Progress Notes (Signed)
PROGRESS NOTE  [name]  JSE:[id number] DOB: 06-Jul-1941  DOA: 02/17/2016 PCP: Benito Mccreedy, MD   Brief Narrative:  75 year old female with ESRD on TTS HD, multiple myeloma, HTN, GERD, anemia of chronic disease, malnutrition, presented to [facility] ED on 5/19 with complaints of left shoulder and knee pain which began after HD the day prior. Unable to clearly say if she was weak on that side. Also complained of nausea, vomiting and diarrhea. No acute issues as per OP dialysis staff. Neurology evaluated in ED and did not believe that she had a CVA. Markedly elevated blood pressure on arrival 171/151-briefly on Cardene drip which was weaned off when blood pressures improved. As per nephrology, history of behavioral issues and resident of SNF. X-rays of left shoulder and knee without fractures. Admitted for hypertensive urgency.   Assessment & Plan:   Active Problems:   ESRD (end stage renal disease) ([place])   Multiple myeloma (HCC)   Hypertension associatd with end stage renal disease on dialysis   Hypertensive urgency   Swelling of joint of left knee  Neutropenic fever/strep bacteremia and sepsis from UTI - BCID confirms strep. Discussed with pharmacy who recommend changing cefepime to Ancef - Chest x-ray without acute findings. Bilateral knees and shoulder exam not consistent with acute infectious arthritis. Seem chronic changes. - Follow final cultures. Monitor.  Hypertensive urgency - Initial blood pressure 171/151. Treated briefly in the ED with Cardene drip and initiated her home oral antihypertensive regimen. Blood pressures improved and weaned off Cardene drip. - Continue amlodipine, hydralazine, Imdur. - Blood pressures starting to increase. Add when necessary IV hydralazine. Monitor closely. - Nephrology will plan to lower volume across HD. - Improved.  Left shoulder/knee pain and? Weakness - CT head without acute findings. No obvious focal deficits. Patient not fully cooperating  with assessment-some of this may be related to pain. - X-rays of left shoulder without acute finding. X-ray of left knee shows joint effusion, probable loose body, tricompartmental osteoarthritis. Uric acid: Normal. Apart from slightly painful range of movements, no acute findings on physical exam. - Denies history of fall. - ? All related to osteoarthritis. Supportive/symptomatic treatment and if it doesn't improve then consider orthopedic consultation. - Neurology consultation 5/20 appreciated: Do not think that she has stroke and field that her left arm dysfunction is related to joint or bony pain. Skeletal survey was recommended. No MRI was felt necessary. - On 5/21, complaints of right upper extremity weakness although no clear focal deficits. Requested neurology to see. Low index of suspicion for stroke.  ESRD on TTS HD/mild hyperkalemia - Nephrology follow-up appreciated. Hyperkalemia resolved. Status post HD on 5/20.  Pancytopenia - Probably multifactorial related to multiple myeloma and ESRD.  - Anemia stable. Thrombocytopenia resolved. WBC 0.8. Received last chemotherapy and Granix on 5/17-WBC may get worse before getting better. Follow daily CBC with differential.  Relapsed and refractory multiple myeloma - No obvious fractures on x-rays of left shoulder and knee.  - Followed at [facility]. Reviewed last progress note from 02/01/16 in care everywhere: No signs or symptoms to suggest he sees progression. Patient determined to continue aggressive treatment. Chemotherapy was held on that day due to [facility] of 0.3 and she has had issues with neutropenia and requires G-CSF support. She received bortezomib, granix, cyclophosphamide on 5/17 at [facility].  History of behavioral issues - Monitor.  Chronic hyponatremia - May be related to ESRD and multiple myeloma. Sodium seems to be at its baseline. Management per nephrology. Follow  BMP.  DVT prophylaxis: SCDs Code Status:  Full Family Communication: Discussed with patient. No family at bedside. Disposition Plan: Admitted to stepdown unit. DC to SNF when medically stable.   Consultants:   Nephrology  Procedures:   HD  Antimicrobials:   None    Subjective: States left shoulder pain better. No knee pain reported. This morning complaints of pain and right shoulder and right upper extremity weakness.  Objective:  Filed Vitals:   02/19/16 0730 02/19/16 0755 02/19/16 1143 02/19/16 1224  BP: 117/82 115/80 146/91   Pulse:  84 94   Temp:  98.5 F (36.9 C)  102.3 F (39.1 C)  TempSrc:  Oral  Oral  Resp: '31 30 25   ' Height:      Weight:      SpO2:  99% 93%     Intake/Output Summary (Last 24 hours) at 02/19/16 1339 Last data filed at 02/18/16 2345  Gross per 24 hour  Intake    363 ml  Output   1800 ml  Net  -1437 ml   Filed Weights   02/17/16 2045 02/18/16 2030  Weight: 62.143 kg (137 lb) 59.7 kg (131 lb 9.8 oz)    Examination:  General exam: Pleasant elderly female sitting up comfortably propped up in bed. RN at bedside. Respiratory system: Clear to auscultation. Respiratory effort normal. Cardiovascular system: S1 & S2 heard, RRR. No JVD, murmurs, rubs, gallops or clicks. No pedal edema. Telemetry: Sinus rhythm. Gastrointestinal system: Abdomen is nondistended, soft and nontender. No organomegaly or masses felt. Normal bowel sounds heard. Central nervous system: Alert and oriented 3. No focal neurological deficits. Extremities: Chronic boggy swelling of both knees without acute findings. B/l shoulders without acute findings but some restricted painful ROM. Left upper extremity AV fistula. Has grade 5 x 5 power distally in both upper extremities. Patient not fully cooperating with our assessment proximally. When you elevate both upper extremities over her face and dropped down (protecting face with examiner's hand), her hands fall to the side suggesting good power in upper extremities  proximally. Skin: No rashes, lesions or ulcers Psychiatry: Judgement and insight appear poor. Mood & affect appropriate.     Data Reviewed: I have personally reviewed following labs and imaging studies  CBC:  Recent Labs Lab 02/17/16 2106 02/18/16 0330 02/19/16 0427  WBC 2.2* 2.2* 0.8*  NEUTROABS 1.6*  --   --   HGB 12.3 11.3* 11.3*  HCT 36.7 35.0* 34.4*  MCV 106.1* 107.0* 106.5*  PLT 164 131* 329   Basic Metabolic Panel:  Recent Labs Lab 02/17/16 2150 02/18/16 0330 02/19/16 0427  NA 130* 128* 131*  K 5.2* 5.3* 4.4  CL 92* 91* 93*  CO2 '26 26 26  ' GLUCOSE 110* 112* 109*  BUN 39* 44* 20  CREATININE 6.60* 7.25* 4.29*  CALCIUM 8.4* 9.0 8.0*  MG  --  2.1  --   PHOS  --  6.4*  --    GFR: Estimated Creatinine Clearance: 10.5 mL/min (by C-G formula based on Cr of 4.29). Liver Function Tests:  Recent Labs Lab 02/18/16 0330  AST 21  ALT 16  ALKPHOS 60  BILITOT 1.0  PROT 7.7  ALBUMIN 2.7*   No results for input(s): LIPASE, AMYLASE in the last 168 hours. No results for input(s): AMMONIA in the last 168 hours. Coagulation Profile: No results for input(s): INR, PROTIME in the last 168 hours. Cardiac Enzymes: No results for input(s): CKTOTAL, CKMB, CKMBINDEX, TROPONINI in the last 168 hours. BNP (last  3 results) No results for input(s): PROBNP in the last 8760 hours. HbA1C: No results for input(s): HGBA1C in the last 72 hours. CBG: No results for input(s): GLUCAP in the last 168 hours. Lipid Profile: No results for input(s): CHOL, HDL, LDLCALC, TRIG, CHOLHDL, LDLDIRECT in the last 72 hours. Thyroid Function Tests:  Recent Labs  02/18/16 0330  TSH 0.636   Anemia Panel: No results for input(s): VITAMINB12, FOLATE, FERRITIN, TIBC, IRON, RETICCTPCT in the last 72 hours.  Sepsis Labs: No results for input(s): PROCALCITON, LATICACIDVEN in the last 168 hours.  Recent Results (from the past 240 hour(s))  Urine culture     Status: Abnormal   Collection Time:  02/18/16  2:45 AM  Result Value Ref Range Status   Specimen Description URINE, CLEAN CATCH  Final   Special Requests NONE  Final   Culture (A)  Final    >=100,000 COLONIES/mL GROUP B STREP(S.AGALACTIAE)ISOLATED TESTING AGAINST S. AGALACTIAE NOT ROUTINELY PERFORMED DUE TO PREDICTABILITY OF AMP/PEN/VAN SUSCEPTIBILITY.    Report Status 02/19/2016 FINAL  Final  MRSA PCR Screening     Status: None   Collection Time: 02/18/16  6:29 PM  Result Value Ref Range Status   MRSA by PCR NEGATIVE NEGATIVE Final    Comment:        The GeneXpert MRSA Assay (FDA approved for NASAL specimens only), is one component of a comprehensive MRSA colonization surveillance program. It is not intended to diagnose MRSA infection nor to guide or monitor treatment for MRSA infections.   Culture, blood (routine x 2)     Status: None (Preliminary result)   Collection Time: 02/18/16  9:40 PM  Result Value Ref Range Status   Specimen Description BLOOD HEMODIALYSIS CATHETER  Final   Special Requests BOTTLES DRAWN AEROBIC AND ANAEROBIC 10CC  Final   Culture  Setup Time   Final    GRAM POSITIVE COCCI IN CHAINS IN BOTH AEROBIC AND ANAEROBIC BOTTLES CRITICAL RESULT CALLED TO, READ BACK BY AND VERIFIED WITH: C DAVIS,RN AT 1144 02/19/16 BY L BENFIELD    Culture PENDING  Incomplete   Report Status PENDING  Incomplete  Culture, blood (routine x 2)     Status: None (Preliminary result)   Collection Time: 02/18/16  9:50 PM  Result Value Ref Range Status   Specimen Description BLOOD HEMODIALYSIS CATHETER  Final   Special Requests BOTTLES DRAWN AEROBIC AND ANAEROBIC 10CC   Final   Culture  Setup Time   Final    GRAM POSITIVE COCCI IN CHAINS IN BOTH AEROBIC AND ANAEROBIC BOTTLES Organism ID to follow CRITICAL RESULT CALLED TO, READ BACK BY AND VERIFIED WITH: Beulah Gandy D AT 1102 02/19/16 BY L BENFIELD    Culture NO GROWTH < 24 HOURS  Final   Report Status PENDING  Incomplete  Blood Culture ID Panel (Reflexed)      Status: Abnormal   Collection Time: 02/18/16  9:50 PM  Result Value Ref Range Status   Enterococcus species NOT DETECTED NOT DETECTED Final   Vancomycin resistance NOT DETECTED NOT DETECTED Final   Listeria monocytogenes NOT DETECTED NOT DETECTED Final   Staphylococcus species NOT DETECTED NOT DETECTED Final   Staphylococcus aureus NOT DETECTED NOT DETECTED Final   Methicillin resistance NOT DETECTED NOT DETECTED Final   Streptococcus species DETECTED (A) NOT DETECTED Final    Comment: CRITICAL RESULT CALLED TO, READ BACK BY AND VERIFIED WITH: Beulah Gandy D AT 1102 02/19/16 BY L BENFIELD    Streptococcus agalactiae DETECTED (A) NOT  DETECTED Final    Comment: CRITICAL RESULT CALLED TO, READ BACK BY AND VERIFIED WITH: Beulah Gandy D AT 1102 02/19/16 BY L BENFIELD    Streptococcus pneumoniae NOT DETECTED NOT DETECTED Final   Streptococcus pyogenes NOT DETECTED NOT DETECTED Final   Acinetobacter baumannii NOT DETECTED NOT DETECTED Final   Enterobacteriaceae species NOT DETECTED NOT DETECTED Final   Enterobacter cloacae complex NOT DETECTED NOT DETECTED Final   Escherichia coli NOT DETECTED NOT DETECTED Final   Klebsiella oxytoca NOT DETECTED NOT DETECTED Final   Klebsiella pneumoniae NOT DETECTED NOT DETECTED Final   Proteus species NOT DETECTED NOT DETECTED Final   Serratia marcescens NOT DETECTED NOT DETECTED Final   Carbapenem resistance NOT DETECTED NOT DETECTED Final   Haemophilus influenzae NOT DETECTED NOT DETECTED Final   Neisseria meningitidis NOT DETECTED NOT DETECTED Final   Pseudomonas aeruginosa NOT DETECTED NOT DETECTED Final   Candida albicans NOT DETECTED NOT DETECTED Final   Candida glabrata NOT DETECTED NOT DETECTED Final   Candida krusei NOT DETECTED NOT DETECTED Final   Candida parapsilosis NOT DETECTED NOT DETECTED Final   Candida tropicalis NOT DETECTED NOT DETECTED Final         Radiology Studies: Dg Chest 2 View  02/18/2016  CLINICAL DATA:  Fever.  History of end-stage renal disease on dialysis, multiple myeloma. EXAM: CHEST  2 VIEW COMPARISON:  Chest radiograph September 03, 2015 FINDINGS: Cardiac silhouette is moderately enlarged. Tortuous calcified aorta. No pleural effusion or focal consolidation. Mildly elevated LEFT hemidiaphragm with strandy densities. No pneumothorax. Vascular clips project in LEFT humerus soft tissues. Patient's LEFT arm at side, limiting assessment of thoracic spine. IMPRESSION: Stable cardiomegaly.  LEFT lung base atelectasis. Electronically Signed   By: Elon Alas M.D.   On: 02/18/2016 02:31   Ct Head Wo Contrast  02/17/2016  CLINICAL DATA:  Left arm pain, hypertensive, weakness, weight loss. EXAM: CT HEAD WITHOUT CONTRAST TECHNIQUE: Contiguous axial images were obtained from the base of the skull through the vertex without intravenous contrast. COMPARISON:  Head CT dated 04/10/2015. FINDINGS: Brain: There is mild generalized age-related brain atrophy with commensurate dilatation of the ventricles and sulci. Mild chronic small vessel ischemic changes noted within the deep periventricular white matter. Small old lacunar infarcts noted within the basal ganglia. There is no mass, hemorrhage, edema or other evidence of acute parenchymal abnormality. No extra-axial hemorrhage. Vascular: No hyperdense vessel or unexpected calcification. There are chronic calcified atherosclerotic changes of the large vessels at the skull base. Skull: Negative for fracture or focal lesion. Sinuses/Orbits: Visualized upper paranasal sinuses are clear. Mastoid air cells are clear. Other: None. IMPRESSION: No acute findings.  No intracranial mass, hemorrhage or edema. Electronically Signed   By: Franki Cabot M.D.   On: 02/17/2016 21:58   Dg Shoulder Left  02/18/2016  CLINICAL DATA:  Pt states she has been having left arm pain and weakness since her dialysis needle was removed on Thursday. Hx multiple myeloma. EXAM: LEFT SHOULDER - 2+ VIEW  COMPARISON:  None. FINDINGS: No fracture.  No bone lesion. Glenohumeral joint and AC joints are normally aligned. No significant arthropathic change. Bones are demineralized. Surgical vascular clips project along the medial left arm. IMPRESSION: No fracture or dislocation.  No acute finding.  No bone lesion. Electronically Signed   By: Lajean Manes M.D.   On: 02/18/2016 10:31   Dg Knee Complete 4 Views Left  02/17/2016  CLINICAL DATA:  Left knee pain and swelling.  No reported injury. EXAM:  LEFT KNEE - COMPLETE 4+ VIEW COMPARISON:  09/03/2015 left knee radiographs FINDINGS: Moderate suprapatellar left knee joint effusion. A 1 cm round calcification medial and anterior to the left distal femoral shaft may represent a loose intra-articular body (this structure was seen lateral to the left distal femur on the 09/03/2015 radiographs). No fracture, dislocation or suspicious focal osseous lesion. Diffuse osteopenia. Mild tricompartmental left knee osteoarthritis. No appreciable joint erosions. Vascular calcifications throughout the soft tissues posteriorly. IMPRESSION: 1. New moderate suprapatellar left knee joint effusion. 2. Probable 1 cm loose intra-articular osseous body in the suprapatellar left knee joint. 3. Mild tricompartmental osteoarthritis in the left knee. No left knee joint erosions. 4. Diffuse osteopenia. Electronically Signed   By: Ilona Sorrel M.D.   On: 02/17/2016 21:41        Scheduled Meds: . amLODipine  5 mg Oral Daily  . calcium acetate  667 mg Oral TID WC  . [START ON 02/21/2016]  ceFAZolin (ANCEF) IV  2 g Intravenous Q T,Th,Sa-HD  . cinacalcet  60 mg Oral Q breakfast  . doxercalciferol  4 mcg Intravenous Q T,Th,Sa-HD  . famotidine  10 mg Oral Daily  . feeding supplement (PRO-STAT SUGAR FREE 64)  30 mL Oral BID  . hydrALAZINE  10 mg Oral TID  . isosorbide mononitrate  60 mg Oral Daily  . lanthanum  2,000 mg Oral TID WC  . mirtazapine  15 mg Oral QHS  . multivitamin  1 tablet  Oral QHS  . pantoprazole  40 mg Oral Daily  . sodium chloride flush  3 mL Intravenous Q12H  . sodium chloride flush  3 mL Intravenous Q12H  . valACYclovir  500 mg Oral Daily   Continuous Infusions:     LOS: 1 day    Time spent: 30 minutes.    [facility], MD [facility] Pager [phone]  If 7PM-7AM, please contact night-coverage [email or web address] Password [email or web address] 02/19/2016, 1:39 PM

## 2016-02-19 NOTE — Progress Notes (Signed)
Critical Lab of Blood culture for 2nd set. Gram positive cocci. Lab already notified Pharmacy and Pharmacy notified Dr. Algis Liming. Antibiotics changed by Pharm D accordingly

## 2016-02-19 NOTE — Progress Notes (Signed)
Patient noted with low grade temperature throughout the night. Tylenol 650 mg given x 2.  Hydralazine 10 mg given for Dystolic BP 123456. Fredirick Maudlin, NP made aware. No new orders at this time. Will continue to mon

## 2016-02-19 NOTE — Evaluation (Signed)
Physical Therapy Evaluation Patient Details Name: [name] MRN: [id number] DOB: October 31, 1940 Today's Date: 02/19/2016   History of Present Illness  Pt is a 75 y/o F who presented to [facility] on 5/19 w/ c/o Lt shoulder and knee pain which began after HD the day prior.  Neurology evaluated in ED and did not believe that she had a CVA.  As per nephrology, history of behavioral issues and resident of SNF. X-rays of left shoulder and knee without fractures. Admitted for hypertensive urgency.  Pt's PMH includes ESRD, closed Rt distal femur fx, multiple myeloma, malnutrition.    Clinical Impression  Pt admitted with above diagnosis. Pt currently with functional limitations due to the deficits listed below (see PT Problem List). [name] presents w/ limited ROM, strength , and hyperalgesia Bil UEs and LEs, limiting her ability to participate in therapy today.  PTA pt reports she was ambulating 300 ft using RW w/ PT at [facility] and was Ind w/ bathing and dressing; however, unable to confirm this as no family present.  She currently requires total assist for mobility and ADLs. Recommend SNF ([facility]) at d/c.  Pt will benefit from skilled PT to increase their independence and safety with mobility to allow discharge to the venue listed below.      Follow Up Recommendations SNF;Supervision/Assistance - 24 hour    Equipment Recommendations  None recommended by PT    Recommendations for Other Services       Precautions / Restrictions Precautions Precautions: Fall Restrictions Weight Bearing Restrictions: No      Mobility  Bed Mobility               General bed mobility comments: Did not attempt as pt moaning and grimacing w/ minimal movement of extremities  Transfers                    Ambulation/Gait                Stairs            Wheelchair Mobility    Modified Rankin (Stroke Patients Only)       Balance                                              Pertinent Vitals/Pain Pain Assessment: Faces Faces Pain Scale: Hurts whole lot Pain Location: Bil LEs, Bil shoulders Pain Descriptors / Indicators: Grimacing;Guarding;Moaning Pain Intervention(s): Limited activity within patient's tolerance;Monitored during session    Home Living Family/patient expects to be discharged to:: Skilled nursing facility                 Additional Comments: From [facility] and plan is to return at d/c    Prior Function Level of Independence: Needs assistance   Gait / Transfers Assistance Needed: Ambulating 300 ft/day at [facility] using RW w/ PT  ADL's / Homemaking Assistance Needed: Per pt, ind w/ bathing, dressing  Comments: Information taken from pt, question reliability     Hand Dominance   Dominant Hand: Right    Extremity/Trunk Assessment   Upper Extremity Assessment: LUE deficits/detail;RUE deficits/detail RUE Deficits / Details: Shoulder AAROM to ~70 deg limited by pain, power grasp 2/5.   RUE Sensation:  (hyperalgesia) LUE Deficits / Details: Shoulder AAROM limited to ~40 deg due to pain; power grasp 2/5.   Lower Extremity Assessment: RLE  deficits/detail;LLE deficits/detail RLE Deficits / Details: Strength grossly 2/5 and limited by pain w/ all motions LLE Deficits / Details: Strength grossly 2/5 and limited by pain w/ all motions.  Lt DF AROM limited to ~(-) 20 deg     Communication   Communication: No difficulties  Cognition Arousal/Alertness: Awake/alert Behavior During Therapy: Anxious Overall Cognitive Status: No family/caregiver present to determine baseline cognitive functioning                      General Comments      Exercises General Exercises - Lower Extremity Ankle Circles/Pumps: AROM;Both;10 reps;Supine      Assessment/Plan    PT Assessment Patient needs continued PT services  PT Diagnosis Difficulty walking;Generalized weakness;Acute pain;Hemiplegia non-dominant  side   PT Problem List Decreased strength;Decreased range of motion;Decreased activity tolerance;Decreased balance;Decreased mobility;Decreased cognition;Decreased coordination;Decreased knowledge of use of DME;Decreased safety awareness;Impaired sensation;Pain  PT Treatment Interventions DME instruction;Gait training;Functional mobility training;Therapeutic activities;Therapeutic exercise;Balance training;Cognitive remediation;Patient/family education;Modalities   PT Goals (Current goals can be found in the Care Plan section) Acute Rehab PT Goals Patient Stated Goal: to feel better PT Goal Formulation: With patient Time For Goal Achievement: 03/04/16 Potential to Achieve Goals: Fair    Frequency Min 2X/week   Barriers to discharge        Co-evaluation               End of Session   Activity Tolerance: Patient limited by pain;Patient limited by fatigue Patient left: in bed;with call bell/phone within reach;with bed alarm set;with nursing/sitter in room Nurse Communication: Mobility status (RN in room during sesion)         Time: 4099-2780 PT Time Calculation (min) (ACUTE ONLY): 11 min   Charges:   PT Evaluation $PT Eval Moderate Complexity: 1 Procedure     PT G Codes:       Collie Siad PT, DPT  Pager: [phone] Phone: [phone] 02/19/2016, 10:15 AM

## 2016-02-20 ENCOUNTER — Other Ambulatory Visit: Payer: Self-pay | Admitting: Hematology and Oncology

## 2016-02-20 ENCOUNTER — Inpatient Hospital Stay (HOSPITAL_COMMUNITY): Payer: Medicare Other

## 2016-02-20 DIAGNOSIS — Z992 Dependence on renal dialysis: Secondary | ICD-10-CM

## 2016-02-20 DIAGNOSIS — M25462 Effusion, left knee: Secondary | ICD-10-CM

## 2016-02-20 LAB — CBC WITH DIFFERENTIAL/PLATELET
BAND NEUTROPHILS: 0 %
BLASTS: 0 %
Basophils Absolute: 0 10*3/uL (ref 0.0–0.1)
Basophils Relative: 0 %
EOS ABS: 0 10*3/uL (ref 0.0–0.7)
Eosinophils Relative: 2 %
HEMATOCRIT: 34 % — AB (ref 36.0–46.0)
HEMOGLOBIN: 11.2 g/dL — AB (ref 12.0–15.0)
LYMPHS PCT: 43 %
Lymphs Abs: 0.5 10*3/uL — ABNORMAL LOW (ref 0.7–4.0)
MCH: 35 pg — AB (ref 26.0–34.0)
MCHC: 32.9 g/dL (ref 30.0–36.0)
MCV: 106.3 fL — AB (ref 78.0–100.0)
MONOS PCT: 32 %
Metamyelocytes Relative: 0 %
Monocytes Absolute: 0.4 10*3/uL (ref 0.1–1.0)
Myelocytes: 0 %
NEUTROS ABS: 0.3 10*3/uL — AB (ref 1.7–7.7)
NEUTROS PCT: 23 %
NRBC: 0 /100{WBCs}
OTHER: 0 %
Platelets: 160 10*3/uL (ref 150–400)
Promyelocytes Absolute: 0 %
RBC: 3.2 MIL/uL — AB (ref 3.87–5.11)
RDW: 14.4 % (ref 11.5–15.5)
WBC: 1.2 10*3/uL — CL (ref 4.0–10.5)

## 2016-02-20 LAB — PATHOLOGIST SMEAR REVIEW

## 2016-02-20 MED ORDER — LORAZEPAM 2 MG/ML IJ SOLN
INTRAMUSCULAR | Status: AC
  Filled 2016-02-20: qty 1

## 2016-02-20 MED ORDER — LORAZEPAM 2 MG/ML IJ SOLN
1.0000 mg | Freq: Once | INTRAMUSCULAR | Status: AC
  Administered 2016-02-20: 1 mg via INTRAVENOUS
  Filled 2016-02-20: qty 1

## 2016-02-20 NOTE — Progress Notes (Signed)
MULTIPLE ATTEMPTS WITH PT, PT YELLING AND MOVING ALL OVER THE PLACE, RN NOTIFIED AND PT WAS GIVEN 1mg  OF ATIVAN, MEDS DID NOT HELP, PT STILL CONTINUED TO YELL AND MOVE HER HEAD ALL AROUND

## 2016-02-20 NOTE — Progress Notes (Signed)
[facility] Progress Note   Subjective: no diagnosis avail- could not cooperate with neuro exam- still with low grade temps- blood and urine cultures positive for strep- stressed and perseverating speech this AM  Filed Vitals:   02/19/16 1600 02/19/16 2115 02/19/16 2335 02/20/16 0400  BP: 112/76 113/79  134/86  Pulse:  84  80  Temp: 99.6 F (37.6 C) 100.4 F (38 C) 100.1 F (37.8 C) 100.1 F (37.8 C)  TempSrc: Oral Axillary Oral Axillary  Resp:  21  16  Height:      Weight:    58.5 kg (128 lb 15.5 oz)  SpO2:  95%  96%    Inpatient medications: . amLODipine  5 mg Oral Daily  . calcium acetate  667 mg Oral TID WC  . [START ON 02/21/2016]  ceFAZolin (ANCEF) IV  2 g Intravenous Q T,Th,Sa-HD  . cinacalcet  60 mg Oral Q breakfast  . doxercalciferol  4 mcg Intravenous Q T,Th,Sa-HD  . famotidine  10 mg Oral Daily  . feeding supplement (PRO-STAT SUGAR FREE 64)  30 mL Oral BID  . hydrALAZINE  10 mg Oral TID  . isosorbide mononitrate  60 mg Oral Daily  . lanthanum  2,000 mg Oral TID WC  . mirtazapine  15 mg Oral QHS  . multivitamin  1 tablet Oral QHS  . pantoprazole  40 mg Oral Daily  . sodium chloride flush  3 mL Intravenous Q12H  . sodium chloride flush  3 mL Intravenous Q12H  . valACYclovir  500 mg Oral Daily     sodium chloride, acetaminophen **OR** acetaminophen, bisacodyl, hydrALAZINE, HYDROcodone-acetaminophen, ondansetron **OR** ondansetron (ZOFRAN) IV, prochlorperazine, sodium chloride flush, zolpidem  Exam: Alert, pressured speech- agitated - not sure where she is  No jvd Chest clear bilat to bases RRR 2/6 sem Abd soft ntnd Ext no LE edema; both shoulder painful w palpation L> R Neuro limited movement L > R UE prob due to joint pain LUA AVF +bruit Alert, Ox 3  Dialysis: TTS SW  3h 39min  57kg  2/2.25 bath   Hep none  LUA AVF Aranesp 60 mcg IV weekly (last dose 02/16/16 HGB 11.1) Hectoral 4 mcg IV q treatment (Ca 7.7 C Ca 8.2 PTH 396 01/26/16 phos  5.7 02/09/16)   Assessment: 1. Neutropenia/ fevers - strep bacteremia - on ancef and valtrex per pharmacy and primary - we can give ancef at OP dialysis  2. L sided weakness - felt to be due to pain/ MS origin. Neuro saw- rec getting MRI as she could not cooperate with exam- but could she cooperate with an MRI ?? Does not seem to be much of an issue 3. HTN'sive urgency - resolved on amlodipine and hydralazine  4. ESRD - TTS HD- will plan for tomorrow via AVF 5. Vol - try for EDW- better after HD on Sat 6. Anemia - HGB over 11-  No ESA for now.  7. Metabolic bone disease - Continue binders- fosrenol and phoslo, VDRA, sensipar 8. Nutrition - Albumin 2.7. Renal diet/prostat/renal vit 9. Myeloma- recent chemoRx at [facility] 4-5 d ago   [place]   02/20/2016, 8:43 AM    Recent Labs Lab 02/17/16 2150 02/18/16 0330 02/19/16 0427  NA 130* 128* 131*  K 5.2* 5.3* 4.4  CL 92* 91* 93*  CO2 26 26 26   GLUCOSE 110* 112* 109*  BUN 39* 44* 20  CREATININE 6.60* 7.25* 4.29*  CALCIUM 8.4* 9.0 8.0*  PHOS  --  6.4*  --  Recent Labs Lab 02/18/16 0330  AST 21  ALT 16  ALKPHOS 60  BILITOT 1.0  PROT 7.7  ALBUMIN 2.7*    Recent Labs Lab 02/17/16 2106 02/18/16 0330 02/19/16 0427 02/20/16 0431  WBC 2.2* 2.2* 0.8* 1.2*  NEUTROABS 1.6*  --   --  0.3*  HGB 12.3 11.3* 11.3* 11.2*  HCT 36.7 35.0* 34.4* 34.0*  MCV 106.1* 107.0* 106.5* 106.3*  PLT 164 131* 164 160

## 2016-02-20 NOTE — Clinical Social Work Note (Signed)
Clinical Social Work Assessment  Patient Details  Name: [name] MRN: [id number] Date of Birth: [birth year]  Date of referral:  02/20/16               Reason for consult:  Facility Placement                Permission sought to share information with:  Family Supports Permission granted to share information::  Yes, Verbal Permission Granted  Name::     Jamison Bacarella  Relationship::  Daughter  Contact Information:  [phone]  Housing/Transportation Living arrangements for the past 2 months:  [place] of Information:  Adult Children Patient Interpreter Needed:  None Criminal Activity/Legal Involvement Pertinent to Current Situation/Hospitalization:  No - Comment as needed Significant Relationships:  Adult Children, Siblings Lives with:  Facility Resident Do you feel safe going back to the place where you live?  Yes Need for family participation in patient care:  Yes (Comment)  Care giving concerns:  Patient daughter does not verbalize any concerns regarding patient return to [place].   Social Worker assessment / plan:  Holiday representative spoke with patient daughter over the phone to offer support and discuss patient needs at discharge.  Patient daughter states that patient is a resident at [facility] and plans to return at discharge.  CSW left message with admissions coordinator at [facility] to confirm patient return once medically stable.  CSW remains available for support and to facilitate patient discharge needs once medically ready.  Employment status:  Retired Forensic scientist:  Medicare PT Recommendations:  [place] / Referral to community resources:  [place]  Patient/Family's Response to care:  Patient daughter verbalized understanding of CSW role and appreciation for support and involvement.  Patient daughter agreeable with patient return at discharge.  Patient/Family's Understanding of and  Emotional Response to Diagnosis, Current Treatment, and Prognosis:  Patient daughter with limited understanding of patient current medical needs, however realistic about patient return to [place] at discharge.  Emotional Assessment Appearance:  Appears stated age Attitude/Demeanor/Rapport:  Unable to Assess Affect (typically observed):  Unable to Assess Orientation:  Oriented to Self Alcohol / Substance use:  Not Applicable Psych involvement (Current and /or in the community):  No (Comment)  Discharge Needs  Concerns to be addressed:  No discharge needs identified Readmission within the last 30 days:  No Current discharge risk:  None Barriers to Discharge:  Continued Medical Work up  [employer], [place]

## 2016-02-20 NOTE — NC FL2 (Signed)
[place] MEDICAID FL2 LEVEL OF CARE SCREENING TOOL     IDENTIFICATION  Patient Name: [name] Birthdate: 1940-12-23 Sex: female Admission Date (Current Location): 02/17/2016  [facility] and [place] Number:  Herbalist and Address:  [facility] [place], [place] [address] [place], [place] [address]      Provider Number: [id number]  Attending Physician Name and Address:  Modena Jansky, MD  Relative Name and Phone Number:       Current Level of Care: Hospital Recommended Level of Care: [place] Prior Approval Number:    Date Approved/Denied:   PASRR Number: [id number]  Discharge Plan: SNF    Current Diagnoses: Patient Active Problem List   Diagnosis Date Noted  . Weakness   . Hypertensive urgency 02/18/2016  . Swelling of joint of left knee 02/18/2016  . Leukopenia due to antineoplastic chemotherapy 09/03/2015  . Hyperkalemia 09/03/2015  . Malnutrition of moderate degree 09/02/2015  . Closed fracture of right distal femur ([place]) 09/01/2015  . GERD (gastroesophageal reflux disease) 04/23/2015  . Fall 04/10/2015  . 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 ESRD (end stage renal disease) ([place]) 03/11/2014  . Multiple myeloma ([place]) 03/11/2014  . Hypertension associatd with end stage renal disease on dialysis 03/11/2014    Orientation RESPIRATION BLADDER Height & Weight     Self  Normal Incontinent Weight: 128 lb 15.5 oz (58.5 kg) Height:  '5\' 7"'  (170.2 cm)  BEHAVIORAL SYMPTOMS/MOOD NEUROLOGICAL BOWEL NUTRITION STATUS      Continent Diet (Renal with Fluid Restriction (1200))  AMBULATORY STATUS COMMUNICATION OF NEEDS Skin   Extensive Assist Verbally PU Stage and Appropriate Care ([place]) PU Stage 1 Dressing: No Dressing                     Personal Care Assistance Level of Assistance  Bathing, Feeding, Dressing Bathing Assistance: Limited assistance Feeding assistance: Limited assistance Dressing Assistance: Limited  assistance     Functional Limitations Info  Sight, Hearing, Speech Sight Info: Impaired Hearing Info: Adequate Speech Info: Adequate    SPECIAL CARE FACTORS FREQUENCY  PT (By licensed PT), OT (By licensed OT)     PT Frequency: 3 OT Frequency: 3            Contractures Contractures Info: Not present    Additional Factors Info  Code Status, Allergies, Psychotropic Code Status Info: Full Code Allergies Info: No Known Allergies Psychotropic Info: Remeron / Ativan         Current Medications (02/20/2016):  This is the current hospital active medication list Current Facility-Administered Medications  Medication Dose Route Frequency Provider Last Rate Last Dose  . 0.9 %  sodium chloride infusion  250 mL Intravenous PRN Toy Baker, MD      . acetaminophen (TYLENOL) tablet 650 mg  650 mg Oral Q6H PRN Toy Baker, MD   650 mg at 02/19/16 2201   Or  . acetaminophen (TYLENOL) suppository 650 mg  650 mg Rectal Q6H PRN Toy Baker, MD   650 mg at 02/19/16 1228  . amLODipine (NORVASC) tablet 5 mg  5 mg Oral Daily Toy Baker, MD   5 mg at 02/20/16 1110  . bisacodyl (DULCOLAX) suppository 10 mg  10 mg Rectal Daily PRN Toy Baker, MD      . calcium acetate (PHOSLO) capsule 667 mg  667 mg Oral TID WC Valentina Gu, NP   667 mg at 02/20/16 1119  . [START ON 02/21/2016] ceFAZolin (ANCEF)  IVPB 2g/100 mL premix  2 g Intravenous Q T,Th,Sa-HD Modena Jansky, MD      . cinacalcet (SENSIPAR) tablet 60 mg  60 mg Oral Q breakfast Toy Baker, MD   60 mg at 02/20/16 1111  . doxercalciferol (HECTOROL) injection 4 mcg  4 mcg Intravenous Q T,Th,Sa-HD Valentina Gu, NP   4 mcg at 02/18/16 2344  . famotidine (PEPCID) tablet 10 mg  10 mg Oral Daily Toy Baker, MD   10 mg at 02/20/16 1109  . feeding supplement (PRO-STAT SUGAR FREE 64) liquid 30 mL  30 mL Oral BID Toy Baker, MD   30 mL at 02/20/16 1000  . hydrALAZINE (APRESOLINE)  injection 10 mg  10 mg Intravenous Q4H PRN Toy Baker, MD   10 mg at 02/19/16 0534  . hydrALAZINE (APRESOLINE) tablet 10 mg  10 mg Oral TID Toy Baker, MD   10 mg at 02/20/16 1109  . HYDROcodone-acetaminophen (NORCO/VICODIN) 5-325 MG per tablet 1 tablet  1 tablet Oral Q6H PRN Modena Jansky, MD   1 tablet at 02/19/16 0511  . isosorbide mononitrate (IMDUR) 24 hr tablet 60 mg  60 mg Oral Daily Toy Baker, MD   60 mg at 02/20/16 1110  . lanthanum (FOSRENOL) chewable tablet 2,000 mg  2,000 mg Oral TID WC Toy Baker, MD   2,000 mg at 02/20/16 1118  . LORazepam (ATIVAN) 2 MG/ML injection           . mirtazapine (REMERON SOL-TAB) disintegrating tablet 15 mg  15 mg Oral QHS Toy Baker, MD   15 mg at 02/19/16 2204  . multivitamin (RENA-VIT) tablet 1 tablet  1 tablet Oral QHS Valentina Gu, NP   1 tablet at 02/19/16 2201  . ondansetron (ZOFRAN) tablet 4 mg  4 mg Oral Q6H PRN Toy Baker, MD       Or  . ondansetron (ZOFRAN) injection 4 mg  4 mg Intravenous Q6H PRN Toy Baker, MD      . pantoprazole (PROTONIX) EC tablet 40 mg  40 mg Oral Daily Toy Baker, MD   40 mg at 02/20/16 1108  . prochlorperazine (COMPAZINE) tablet 10 mg  10 mg Oral Q6H PRN Toy Baker, MD      . sodium chloride flush (NS) 0.9 % injection 3 mL  3 mL Intravenous Q12H Toy Baker, MD   3 mL at 02/20/16 1000  . sodium chloride flush (NS) 0.9 % injection 3 mL  3 mL Intravenous Q12H Toy Baker, MD   3 mL at 02/20/16 1000  . sodium chloride flush (NS) 0.9 % injection 3 mL  3 mL Intravenous PRN Toy Baker, MD      . valACYclovir (VALTREX) tablet 500 mg  500 mg Oral Daily Toy Baker, MD   500 mg at 02/20/16 1110  . zolpidem (AMBIEN) tablet 5 mg  5 mg Oral QHS PRN Toy Baker, MD         Discharge Medications: Please see discharge summary for a list of discharge medications.  Relevant Imaging Results:  Relevant Lab  Results:   Additional Information SSN [id number]  Barbette Or, [place]
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
## 2016-02-20 NOTE — Progress Notes (Addendum)
PROGRESS NOTE  [name]  IEP:[id number] DOB: Nov 05, 1940  DOA: 02/17/2016 PCP: Benito Mccreedy, MD   Brief Narrative:  75 year old female with ESRD on TTS HD, multiple myeloma, HTN, GERD, anemia of chronic disease, malnutrition, presented to [facility] ED on 5/19 with complaints of left shoulder and knee pain which began after HD the day prior. Unable to clearly say if she was weak on that side. Also complained of nausea, vomiting and diarrhea. No acute issues as per OP dialysis staff. Neurology evaluated in ED and did not believe that she had a CVA. Markedly elevated blood pressure on arrival 171/151-briefly on Cardene drip which was weaned off when blood pressures improved. As per nephrology, history of behavioral issues and resident of SNF. X-rays of left shoulder and knee without fractures. Admitted for hypertensive urgency.   Assessment & Plan:   Active Problems:   ESRD (end stage renal disease) ([place])   Multiple myeloma ([facility])   Hypertension associatd with end stage renal disease on dialysis   Hypertensive urgency   Swelling of joint of left knee   Weakness  Neutropenic fever/strep bacteremia and sepsis from UTI - BCID confirms strep. Discussed with pharmacy on 5/20 & changed cefepime to Ancef - Chest x-ray without acute findings. Bilateral knees and shoulder exam not consistent with acute infectious arthritis. Seem chronic changes. - Follow final cultures. Monitor. - Patient is immunocompromised from multiple myeloma and its treatment.  Hypertensive urgency - Initial blood pressure 171/151. Treated briefly in the ED with Cardene drip and initiated her home oral antihypertensive regimen. Blood pressures improved and weaned off Cardene drip. - Continue amlodipine, hydralazine, Imdur. - Blood pressures controlled  B/L shoulders/knee pain and ?? Weakness - CT head without acute findings. No obvious focal deficits. Patient not fully cooperating with assessment-some of this may be related  to pain. - X-rays of left shoulder without acute finding. X-ray of left knee shows joint effusion, probable loose body, tricompartmental osteoarthritis. Uric acid: Normal. No acute arthritis findings on physical exam. - Denies history of fall. - ? All related to osteoarthritis versus skeletal metastases from multiple myeloma. - Neurology consultation appreciated: Patient not cooperative to physical exam. However asymmetrical weakness felt to be more related to pain than stroke. Patient did not cooperate with MRI despite Ativan on 5/21. - Discussed with [name], oncology on 5/21 who indicated that patient has refractory multiple myeloma and has very poor prognosis. She recommended steroids for bony pain (however not started due to infection in immunosuppressed patients) or oxycodone. She recommended palliative care consultation if patient/family agreeable - daughter [name] is at work and will attempt to discuss later this afternoon.  ESRD on TTS HD/mild hyperkalemia - Nephrology follow-up appreciated. Hyperkalemia resolved. Status post HD on 5/20.  Pancytopenia - Probably multifactorial related to multiple myeloma and ESRD.  - Anemia stable. Thrombocytopenia resolved. WBC 0.8. Received last chemotherapy and Granix on 5/17-WBC may get worse before getting better. Follow daily CBC with differential. WBC 1.2/ANC 0.3  Relapsed and refractory multiple myeloma - No obvious fractures on x-rays of left shoulder and knee.  - Followed at [facility]. Reviewed last progress note from 02/01/16 in care everywhere: No signs or symptoms to suggest he sees progression. Patient determined to continue aggressive treatment. Chemotherapy was held on that day due to [facility] of 0.3 and she has had issues with neutropenia and requires G-CSF support. She received bortezomib, granix, cyclophosphamide on 5/17 at [facility]. - Please see above regarding discussion with oncology  History  of behavioral issues - Monitor.  Current ongoing issues.  Chronic hyponatremia - May be related to ESRD and multiple myeloma. Sodium seems to be at its baseline. Management per nephrology. Follow BMP.  DVT prophylaxis: SCDs Code Status: Full Family Communication: Discussed with patient. Discussed with daughter, Ms. Tarhonda Hollenberg. Updated care and answered questions. Discussed about palliative care-she wishes to think about it before making a decision. Disposition Plan: Admitted to stepdown unit. DC to SNF when medically stable.   Consultants:   Nephrology  Neurology  Procedures:   HD  Antimicrobials:   None    Subjective: Unable to get proper history-answers tangentially/inappropriately. Suddenly starts crying. Does not appear in pain.  Objective:  Filed Vitals:   02/19/16 1600 02/19/16 2115 02/19/16 2335 02/20/16 0400  BP: 112/76 113/79  134/86  Pulse:  84  80  Temp: 99.6 F (37.6 C) 100.4 F (38 C) 100.1 F (37.8 C) 100.1 F (37.8 C)  TempSrc: Oral Axillary Oral Axillary  Resp:  21  16  Height:      Weight:    58.5 kg (128 lb 15.5 oz)  SpO2:  95%  96%    Intake/Output Summary (Last 24 hours) at 02/20/16 1234 Last data filed at 02/20/16 0900  Gross per 24 hour  Intake     50 ml  Output      0 ml  Net     50 ml   Filed Weights   02/17/16 2045 02/18/16 2030 02/20/16 0400  Weight: 62.143 kg (137 lb) 59.7 kg (131 lb 9.8 oz) 58.5 kg (128 lb 15.5 oz)    Examination:  General exam: Pleasant elderly female sitting up comfortably propped up in bed.  Respiratory system: Clear to auscultation. Respiratory effort normal. Cardiovascular system: S1 & S2 heard, RRR. No JVD, murmurs, rubs, gallops or clicks. No pedal edema. Telemetry: Sinus rhythm. Gastrointestinal system: Abdomen is nondistended, soft and nontender. No organomegaly or masses felt. Normal bowel sounds heard. Central nervous system: Alert and oriented 3. No focal neurological deficits. Extremities: Chronic boggy swelling of both knees  without acute findings. B/l shoulders without acute findings but some restricted painful ROM. Left upper extremity AV fistula. Patient not fully cooperating with power assessment. Skin: No rashes, lesions or ulcers Psychiatry: Judgement and insight appear poor. Mood & affect appropriate.     Data Reviewed: I have personally reviewed following labs and imaging studies  CBC:  Recent Labs Lab 02/17/16 2106 02/18/16 0330 02/19/16 0427 02/20/16 0431  WBC 2.2* 2.2* 0.8* 1.2*  NEUTROABS 1.6*  --   --  0.3*  HGB 12.3 11.3* 11.3* 11.2*  HCT 36.7 35.0* 34.4* 34.0*  MCV 106.1* 107.0* 106.5* 106.3*  PLT 164 131* 164 116   Basic Metabolic Panel:  Recent Labs Lab 02/17/16 2150 02/18/16 0330 02/19/16 0427  NA 130* 128* 131*  K 5.2* 5.3* 4.4  CL 92* 91* 93*  CO2 _0 GLUCOSE 110* 112* 109*  BUN 39* 44* 20  CREATININE 6.60* 7.25* 4.29*  CALCIUM 8.4* 9.0 8.0*  MG  --  2.1  --   PHOS  --  6.4*  --    GFR: Estimated Creatinine Clearance: 10.3 mL/min (by C-G formula based on Cr of 4.29). Liver Function Tests:  Recent Labs Lab 02/18/16 0330  AST 21  ALT 16  ALKPHOS 60  BILITOT 1.0  PROT 7.7  ALBUMIN 2.7*   No results for input(s): LIPASE, AMYLASE in the last 168 hours. No results for input(s): AMMONIA  in the last 168 hours. Coagulation Profile: No results for input(s): INR, PROTIME in the last 168 hours. Cardiac Enzymes: No results for input(s): CKTOTAL, CKMB, CKMBINDEX, TROPONINI in the last 168 hours. BNP (last 3 results) No results for input(s): PROBNP in the last 8760 hours. HbA1C: No results for input(s): HGBA1C in the last 72 hours. CBG: No results for input(s): GLUCAP in the last 168 hours. Lipid Profile: No results for input(s): CHOL, HDL, LDLCALC, TRIG, CHOLHDL, LDLDIRECT in the last 72 hours. Thyroid Function Tests:  Recent Labs  02/18/16 0330  TSH 0.636   Anemia Panel: No results for input(s): VITAMINB12, FOLATE, FERRITIN, TIBC, IRON, RETICCTPCT  in the last 72 hours.  Sepsis Labs: No results for input(s): PROCALCITON, LATICACIDVEN in the last 168 hours.  Recent Results (from the past 240 hour(s))  Urine culture     Status: Abnormal   Collection Time: 02/18/16  2:45 AM  Result Value Ref Range Status   Specimen Description URINE, CLEAN CATCH  Final   Special Requests NONE  Final   Culture (A)  Final    >=100,000 COLONIES/mL GROUP B STREP(S.AGALACTIAE)ISOLATED TESTING AGAINST S. AGALACTIAE NOT ROUTINELY PERFORMED DUE TO PREDICTABILITY OF AMP/PEN/VAN SUSCEPTIBILITY.    Report Status 02/19/2016 FINAL  Final  MRSA PCR Screening     Status: None   Collection Time: 02/18/16  6:29 PM  Result Value Ref Range Status   MRSA by PCR NEGATIVE NEGATIVE Final    Comment:        The GeneXpert MRSA Assay (FDA approved for NASAL specimens only), is one component of a comprehensive MRSA colonization surveillance program. It is not intended to diagnose MRSA infection nor to guide or monitor treatment for MRSA infections.   Culture, blood (routine x 2)     Status: Abnormal (Preliminary result)   Collection Time: 02/18/16  9:40 PM  Result Value Ref Range Status   Specimen Description BLOOD HEMODIALYSIS CATHETER  Final   Special Requests BOTTLES DRAWN AEROBIC AND ANAEROBIC 10CC  Final   Culture  Setup Time   Final    GRAM POSITIVE COCCI IN CHAINS IN BOTH AEROBIC AND ANAEROBIC BOTTLES CRITICAL RESULT CALLED TO, READ BACK BY AND VERIFIED WITH: C DAVIS,RN AT 1144 02/19/16 BY L BENFIELD    Culture GROUP B STREP(S.AGALACTIAE)ISOLATED (A)  Final   Report Status PENDING  Incomplete  Culture, blood (routine x 2)     Status: Abnormal (Preliminary result)   Collection Time: 02/18/16  9:50 PM  Result Value Ref Range Status   Specimen Description BLOOD HEMODIALYSIS CATHETER  Final   Special Requests BOTTLES DRAWN AEROBIC AND ANAEROBIC 10CC   Final   Culture  Setup Time   Final    GRAM POSITIVE COCCI IN CHAINS IN BOTH AEROBIC AND ANAEROBIC  BOTTLES Organism ID to follow CRITICAL RESULT CALLED TO, READ BACK BY AND VERIFIED WITH: Beulah Gandy D AT 1102 02/19/16 BY L BENFIELD    Culture (A)  Final    GROUP B STREP(S.AGALACTIAE)ISOLATED SUSCEPTIBILITIES TO FOLLOW    Report Status PENDING  Incomplete  Blood Culture ID Panel (Reflexed)     Status: Abnormal   Collection Time: 02/18/16  9:50 PM  Result Value Ref Range Status   Enterococcus species NOT DETECTED NOT DETECTED Final   Vancomycin resistance NOT DETECTED NOT DETECTED Final   Listeria monocytogenes NOT DETECTED NOT DETECTED Final   Staphylococcus species NOT DETECTED NOT DETECTED Final   Staphylococcus aureus NOT DETECTED NOT DETECTED Final   Methicillin resistance NOT  DETECTED NOT DETECTED Final   Streptococcus species DETECTED (A) NOT DETECTED Final    Comment: CRITICAL RESULT CALLED TO, READ BACK BY AND VERIFIED WITH: Beulah Gandy D AT 1102 02/19/16 BY L BENFIELD    Streptococcus agalactiae DETECTED (A) NOT DETECTED Final    Comment: CRITICAL RESULT CALLED TO, READ BACK BY AND VERIFIED WITH: Beulah Gandy D AT 1102 02/19/16 BY L BENFIELD    Streptococcus pneumoniae NOT DETECTED NOT DETECTED Final   Streptococcus pyogenes NOT DETECTED NOT DETECTED Final   Acinetobacter baumannii NOT DETECTED NOT DETECTED Final   Enterobacteriaceae species NOT DETECTED NOT DETECTED Final   Enterobacter cloacae complex NOT DETECTED NOT DETECTED Final   Escherichia coli NOT DETECTED NOT DETECTED Final   Klebsiella oxytoca NOT DETECTED NOT DETECTED Final   Klebsiella pneumoniae NOT DETECTED NOT DETECTED Final   Proteus species NOT DETECTED NOT DETECTED Final   Serratia marcescens NOT DETECTED NOT DETECTED Final   Carbapenem resistance NOT DETECTED NOT DETECTED Final   Haemophilus influenzae NOT DETECTED NOT DETECTED Final   Neisseria meningitidis NOT DETECTED NOT DETECTED Final   Pseudomonas aeruginosa NOT DETECTED NOT DETECTED Final   Candida albicans NOT DETECTED NOT DETECTED Final    Candida glabrata NOT DETECTED NOT DETECTED Final   Candida krusei NOT DETECTED NOT DETECTED Final   Candida parapsilosis NOT DETECTED NOT DETECTED Final   Candida tropicalis NOT DETECTED NOT DETECTED Final         Radiology Studies: No results found.      Scheduled Meds: . amLODipine  5 mg Oral Daily  . calcium acetate  667 mg Oral TID WC  . [START ON 02/21/2016]  ceFAZolin (ANCEF) IV  2 g Intravenous Q T,Th,Sa-HD  . cinacalcet  60 mg Oral Q breakfast  . doxercalciferol  4 mcg Intravenous Q T,Th,Sa-HD  . famotidine  10 mg Oral Daily  . feeding supplement (PRO-STAT SUGAR FREE 64)  30 mL Oral BID  . hydrALAZINE  10 mg Oral TID  . isosorbide mononitrate  60 mg Oral Daily  . lanthanum  2,000 mg Oral TID WC  . LORazepam      . mirtazapine  15 mg Oral QHS  . multivitamin  1 tablet Oral QHS  . pantoprazole  40 mg Oral Daily  . sodium chloride flush  3 mL Intravenous Q12H  . sodium chloride flush  3 mL Intravenous Q12H  . valACYclovir  500 mg Oral Daily   Continuous Infusions:     LOS: 2 days    Time spent: 30 minutes.    [facility], MD [facility] Pager [phone]  If 7PM-7AM, please contact night-coverage [email or web address] Password [facility] 02/20/2016, 12:34 PM

## 2016-02-20 NOTE — Care Management Important Message (Signed)
Important Message  Patient Details  Name: [name] MRN: [id number] Date of Birth: 07/02/1941   Medicare Important Message Given:  Yes    Nathen May 02/20/2016, 1:45 PM

## 2016-02-21 DIAGNOSIS — D61810 Antineoplastic chemotherapy induced pancytopenia: Secondary | ICD-10-CM

## 2016-02-21 LAB — CBC WITH DIFFERENTIAL/PLATELET
BASOS ABS: 0 10*3/uL (ref 0.0–0.1)
BASOS PCT: 0 %
Eosinophils Absolute: 0 10*3/uL (ref 0.0–0.7)
Eosinophils Relative: 1 %
HEMATOCRIT: 32.3 % — AB (ref 36.0–46.0)
Hemoglobin: 10.5 g/dL — ABNORMAL LOW (ref 12.0–15.0)
LYMPHS ABS: 0.3 10*3/uL — AB (ref 0.7–4.0)
Lymphocytes Relative: 15 %
MCH: 34.4 pg — ABNORMAL HIGH (ref 26.0–34.0)
MCHC: 32.5 g/dL (ref 30.0–36.0)
MCV: 105.9 fL — AB (ref 78.0–100.0)
MONO ABS: 0.9 10*3/uL (ref 0.1–1.0)
Monocytes Relative: 41 %
NEUTROS ABS: 0.9 10*3/uL — AB (ref 1.7–7.7)
NEUTROS PCT: 43 %
PLATELETS: 184 10*3/uL (ref 150–400)
RBC: 3.05 MIL/uL — AB (ref 3.87–5.11)
RDW: 14.7 % (ref 11.5–15.5)
WBC: 2.1 10*3/uL — ABNORMAL LOW (ref 4.0–10.5)

## 2016-02-21 LAB — RENAL FUNCTION PANEL
Albumin: 2.1 g/dL — ABNORMAL LOW (ref 3.5–5.0)
Anion gap: 15 (ref 5–15)
BUN: 70 mg/dL — AB (ref 6–20)
CHLORIDE: 92 mmol/L — AB (ref 101–111)
CO2: 23 mmol/L (ref 22–32)
CREATININE: 8.59 mg/dL — AB (ref 0.44–1.00)
Calcium: 8 mg/dL — ABNORMAL LOW (ref 8.9–10.3)
GFR calc Af Amer: 5 mL/min — ABNORMAL LOW (ref >60)
GFR, EST NON AFRICAN AMERICAN: 4 mL/min — AB (ref >60)
Glucose, Bld: 76 mg/dL (ref 65–99)
POTASSIUM: 5.5 mmol/L — AB (ref 3.5–5.1)
Phosphorus: 7.2 mg/dL — ABNORMAL HIGH (ref 2.5–4.6)
Sodium: 130 mmol/L — ABNORMAL LOW (ref 135–145)

## 2016-02-21 MED ORDER — SIMETHICONE 80 MG PO CHEW
160.0000 mg | CHEWABLE_TABLET | Freq: Four times a day (QID) | ORAL | Status: DC | PRN
  Administered 2016-02-21 – 2016-02-22 (×2): 160 mg via ORAL
  Filled 2016-02-21 (×2): qty 2

## 2016-02-21 MED ORDER — VALACYCLOVIR HCL 500 MG PO TABS
500.0000 mg | ORAL_TABLET | ORAL | Status: DC
  Administered 2016-02-22 – 2016-03-01 (×6): 500 mg via ORAL
  Filled 2016-02-21 (×6): qty 1

## 2016-02-21 MED ORDER — DOXERCALCIFEROL 4 MCG/2ML IV SOLN
INTRAVENOUS | Status: AC
  Filled 2016-02-21: qty 2

## 2016-02-21 NOTE — Progress Notes (Addendum)
PROGRESS NOTE  Kynnadi Dicenso  QBH:[id number] DOB: [birth year]  DOA: 02/17/2016 PCP: Benito Mccreedy, MD   Brief Narrative:  75 year old female with ESRD on TTS HD, multiple myeloma, HTN, GERD, anemia of chronic disease, malnutrition, presented to [facility] ED on 5/19 with complaints of left shoulder and knee pain which began after HD the day prior. Unable to clearly say if she was weak on that side. Also complained of nausea, vomiting and diarrhea. No acute issues as per OP dialysis staff. Neurology evaluated in ED and did not believe that she had a CVA. Markedly elevated blood pressure on arrival 171/151-briefly on Cardene drip which was weaned off when blood pressures improved. As per nephrology, history of behavioral issues and resident of SNF. X-rays of left shoulder and knee without fractures. Admitted for hypertensive urgency-resolved. Ruled in for group B strep bacteremia and UTI. At baseline seems to have some behavioral issues (daughter however denies) but fluctuating and somewhat worse in the hospital. Patient did not cooperate for MRI brain. Nephrology following.   Assessment & Plan:   Active Problems:   ESRD (end stage renal disease) ([place])   Multiple myeloma ([facility])   Hypertension associatd with end stage renal disease on dialysis   Hypertensive urgency   Swelling of joint of left knee   Weakness  Neutropenic fever/group B strep bacteremia and sepsis from UTI - On IV Ancef which can be given across HD. Duration to be determined prior to discharge. - Chest x-ray without acute findings. Bilateral knees and shoulder exam not consistent with acute infectious arthritis. Seem chronic changes. - Patient is immunocompromised from multiple myeloma and its treatment. - Improving. Fevers decreasing and resolving neutropenia.  Hypertensive urgency - Initial blood pressure 171/151. Treated briefly in the ED with Cardene drip and initiated her home oral antihypertensive regimen. Blood pressures  improved and weaned off Cardene drip. - Continue amlodipine, hydralazine, Imdur. - Blood pressures controlled  B/L shoulders/knee pain and ?? Weakness - CT head without acute findings. No obvious focal deficits. Patient not fully cooperating with assessment-some of this may be related to pain. - X-rays of left shoulder without acute finding. X-ray of left knee shows joint effusion, probable loose body, tricompartmental osteoarthritis. Uric acid: Normal. No acute arthritis findings on physical exam. - Denies history of fall. - ? All related to osteoarthritis versus skeletal metastases from multiple myeloma. - Neurology consultation appreciated: Patient not cooperative to physical exam. However asymmetrical weakness felt to be more related to pain than stroke. Patient did not cooperate with MRI despite Ativan on 5/21. - Discussed with [name], oncology on 5/21 who indicated that patient has refractory multiple myeloma and has very poor prognosis. She recommended steroids for bony pain (however not started due to infection in immunosuppressed patients) or oxycodone (as per RN, patient refuses). She recommended palliative care consultation- discussed with daughter on 5/22 and she wishes to think it over before deciding.  ESRD on TTS HD/mild hyperkalemia - Nephrology follow-up appreciated. Hyperkalemia resolved. Status post HD on 5/20.  Pancytopenia - Probably multifactorial related to multiple myeloma and ESRD.  - Anemia stable. Thrombocytopenia resolved. Received last chemotherapy and Granix on 5/17-WBC may get worse before getting better. Follow daily CBC with differential. WBC 2.1/ANC 0.9  Relapsed and refractory multiple myeloma - No obvious fractures on x-rays of left shoulder and knee.  - Followed at [facility]. Reviewed last progress note from 02/01/16 in care everywhere: No signs or symptoms to suggest he sees progression. Patient determined to continue aggressive  treatment.  Chemotherapy was held on that day due to [facility] of 0.3 and she has had issues with neutropenia and requires G-CSF support. She received bortezomib, granix, cyclophosphamide on 5/17 at [facility]. - Please see above regarding discussion with oncology  History of behavioral issues/confusion/altered mental status - Monitor. Current ongoing issues. - As per Dr. Moshe Cipro, Nephrology who is familiar with patient, on 5/22 patient seemed to be acting her normal but on 5/23 MS seemed worse. Etiology? Acute illness, stressful situation affecting underlying cognitive impairment,? Valtrex-reducing dose and should clear with HD. If does not improve,? Consider MRI with sedation (discuss with neurology).  Chronic hyponatremia - May be related to ESRD and multiple myeloma. Sodium seems to be at its baseline. Management per nephrology.   DVT prophylaxis: SCDs Code Status: Full Family Communication: Discussed with patient. Discussed with daughter, Ms. Marjie Chea on 5/22. Updated care and answered questions. Discussed about palliative care-she wishes to think about it before making a decision. Disposition Plan: Admitted to stepdown unit. DC to SNF when medically stable.   Consultants:   Nephrology  Neurology  Procedures:   HD  Antimicrobials:   Valacyclovir 5/20 >  Cefepime 5/21 >   Subjective: Patient was seen along with 3 W Geneticist, molecular in room. Patient appears confused. Oriented only to self. Seen eating breakfast-feeding herself with right hand. States that she is in "[place]". No specific complaints reported. As per her RN, refuses pain medications.  Objective:  Filed Vitals:   02/20/16 1735 02/20/16 2058 02/21/16 0500 02/21/16 1300  BP: 167/120 132/69 146/84 128/78  Pulse:  91 82 78  Temp:  100.7 F (38.2 C) 98.5 F (36.9 C) 99.2 F (37.3 C)  TempSrc:  Oral Oral Oral  Resp:  18 18   Height:      Weight:   58.06 kg (128 lb)   SpO2:  97% 99% 100%     Intake/Output Summary (Last 24 hours) at 02/21/16 1418 Last data filed at 02/21/16 1300  Gross per 24 hour  Intake    120 ml  Output      0 ml  Net    120 ml   Filed Weights   02/18/16 2030 02/20/16 0400 02/21/16 0500  Weight: 59.7 kg (131 lb 9.8 oz) 58.5 kg (128 lb 15.5 oz) 58.06 kg (128 lb)    Examination:  General exam: Pleasant elderly female sitting up comfortably in bed and seen eating breakfast. Respiratory system: Clear to auscultation. Respiratory effort normal. Cardiovascular system: S1 & S2 heard, RRR. No JVD, murmurs, rubs, gallops or clicks. No pedal edema.  Gastrointestinal system: Abdomen is nondistended, soft and nontender. No organomegaly or masses felt. Normal bowel sounds heard. Central nervous system: Alert and oriented self only. No focal neurological deficits. Extremities: Chronic boggy swelling of both knees without acute findings. B/l shoulders without acute findings but some restricted painful ROM. Left upper extremity AV fistula. Patient not fully cooperating with power assessment. Skin: No rashes, lesions or ulcers Psychiatry: Judgement and insight appear poor. Mood & affect appropriate.     Data Reviewed: I have personally reviewed following labs and imaging studies  CBC:  Recent Labs Lab 02/17/16 2106 02/18/16 0330 02/19/16 0427 02/20/16 0431 02/21/16 0328  WBC 2.2* 2.2* 0.8* 1.2* 2.1*  NEUTROABS 1.6*  --   --  0.3* 0.9*  HGB 12.3 11.3* 11.3* 11.2* 10.5*  HCT 36.7 35.0* 34.4* 34.0* 32.3*  MCV 106.1* 107.0* 106.5* 106.3* 105.9*  PLT 164 131* 164 160  532   Basic Metabolic Panel:  Recent Labs Lab 02/17/16 2150 02/18/16 0330 02/19/16 0427  NA 130* 128* 131*  K 5.2* 5.3* 4.4  CL 92* 91* 93*  CO2 _0 GLUCOSE 110* 112* 109*  BUN 39* 44* 20  CREATININE 6.60* 7.25* 4.29*  CALCIUM 8.4* 9.0 8.0*  MG  --  2.1  --   PHOS  --  6.4*  --    GFR: Estimated Creatinine Clearance: 10.2 mL/min (by C-G formula based on Cr of  4.29). Liver Function Tests:  Recent Labs Lab 02/18/16 0330  AST 21  ALT 16  ALKPHOS 60  BILITOT 1.0  PROT 7.7  ALBUMIN 2.7*   No results for input(s): LIPASE, AMYLASE in the last 168 hours. No results for input(s): AMMONIA in the last 168 hours. Coagulation Profile: No results for input(s): INR, PROTIME in the last 168 hours. Cardiac Enzymes: No results for input(s): CKTOTAL, CKMB, CKMBINDEX, TROPONINI in the last 168 hours. BNP (last 3 results) No results for input(s): PROBNP in the last 8760 hours. HbA1C: No results for input(s): HGBA1C in the last 72 hours. CBG: No results for input(s): GLUCAP in the last 168 hours. Lipid Profile: No results for input(s): CHOL, HDL, LDLCALC, TRIG, CHOLHDL, LDLDIRECT in the last 72 hours. Thyroid Function Tests: No results for input(s): TSH, T4TOTAL, FREET4, T3FREE, THYROIDAB in the last 72 hours. Anemia Panel: No results for input(s): VITAMINB12, FOLATE, FERRITIN, TIBC, IRON, RETICCTPCT in the last 72 hours.  Sepsis Labs: No results for input(s): PROCALCITON, LATICACIDVEN in the last 168 hours.  Recent Results (from the past 240 hour(s))  Urine culture     Status: Abnormal   Collection Time: 02/18/16  2:45 AM  Result Value Ref Range Status   Specimen Description URINE, CLEAN CATCH  Final   Special Requests NONE  Final   Culture (A)  Final    >=100,000 COLONIES/mL GROUP B STREP(S.AGALACTIAE)ISOLATED TESTING AGAINST S. AGALACTIAE NOT ROUTINELY PERFORMED DUE TO PREDICTABILITY OF AMP/PEN/VAN SUSCEPTIBILITY.    Report Status 02/19/2016 FINAL  Final  MRSA PCR Screening     Status: None   Collection Time: 02/18/16  6:29 PM  Result Value Ref Range Status   MRSA by PCR NEGATIVE NEGATIVE Final    Comment:        The GeneXpert MRSA Assay (FDA approved for NASAL specimens only), is one component of a comprehensive MRSA colonization surveillance program. It is not intended to diagnose MRSA infection nor to guide or monitor treatment  for MRSA infections.   Culture, blood (routine x 2)     Status: Abnormal (Preliminary result)   Collection Time: 02/18/16  9:40 PM  Result Value Ref Range Status   Specimen Description BLOOD HEMODIALYSIS CATHETER  Final   Special Requests BOTTLES DRAWN AEROBIC AND ANAEROBIC 10CC  Final   Culture  Setup Time   Final    GRAM POSITIVE COCCI IN CHAINS IN BOTH AEROBIC AND ANAEROBIC BOTTLES CRITICAL RESULT CALLED TO, READ BACK BY AND VERIFIED WITH: C DAVIS,RN AT 1144 02/19/16 BY L BENFIELD    Culture (A)  Final    GROUP B STREP(S.AGALACTIAE)ISOLATED SUSCEPTIBILITIES TO FOLLOW    Report Status PENDING  Incomplete  Culture, blood (routine x 2)     Status: Abnormal (Preliminary result)   Collection Time: 02/18/16  9:50 PM  Result Value Ref Range Status   Specimen Description BLOOD HEMODIALYSIS CATHETER  Final   Special Requests BOTTLES DRAWN AEROBIC AND ANAEROBIC 10CC   Final  Culture  Setup Time   Final    GRAM POSITIVE COCCI IN CHAINS IN BOTH AEROBIC AND ANAEROBIC BOTTLES Organism ID to follow CRITICAL RESULT CALLED TO, READ BACK BY AND VERIFIED WITH: Beulah Gandy D AT 1102 02/19/16 BY L BENFIELD    Culture (A)  Final    GROUP B STREP(S.AGALACTIAE)ISOLATED SUSCEPTIBILITIES TO FOLLOW    Report Status PENDING  Incomplete  Blood Culture ID Panel (Reflexed)     Status: Abnormal   Collection Time: 02/18/16  9:50 PM  Result Value Ref Range Status   Enterococcus species NOT DETECTED NOT DETECTED Final   Vancomycin resistance NOT DETECTED NOT DETECTED Final   Listeria monocytogenes NOT DETECTED NOT DETECTED Final   Staphylococcus species NOT DETECTED NOT DETECTED Final   Staphylococcus aureus NOT DETECTED NOT DETECTED Final   Methicillin resistance NOT DETECTED NOT DETECTED Final   Streptococcus species DETECTED (A) NOT DETECTED Final    Comment: CRITICAL RESULT CALLED TO, READ BACK BY AND VERIFIED WITH: Beulah Gandy D AT 1102 02/19/16 BY L BENFIELD    Streptococcus agalactiae DETECTED  (A) NOT DETECTED Final    Comment: CRITICAL RESULT CALLED TO, READ BACK BY AND VERIFIED WITH: Beulah Gandy D AT 1102 02/19/16 BY L BENFIELD    Streptococcus pneumoniae NOT DETECTED NOT DETECTED Final   Streptococcus pyogenes NOT DETECTED NOT DETECTED Final   Acinetobacter baumannii NOT DETECTED NOT DETECTED Final   Enterobacteriaceae species NOT DETECTED NOT DETECTED Final   Enterobacter cloacae complex NOT DETECTED NOT DETECTED Final   Escherichia coli NOT DETECTED NOT DETECTED Final   Klebsiella oxytoca NOT DETECTED NOT DETECTED Final   Klebsiella pneumoniae NOT DETECTED NOT DETECTED Final   Proteus species NOT DETECTED NOT DETECTED Final   Serratia marcescens NOT DETECTED NOT DETECTED Final   Carbapenem resistance NOT DETECTED NOT DETECTED Final   Haemophilus influenzae NOT DETECTED NOT DETECTED Final   Neisseria meningitidis NOT DETECTED NOT DETECTED Final   Pseudomonas aeruginosa NOT DETECTED NOT DETECTED Final   Candida albicans NOT DETECTED NOT DETECTED Final   Candida glabrata NOT DETECTED NOT DETECTED Final   Candida krusei NOT DETECTED NOT DETECTED Final   Candida parapsilosis NOT DETECTED NOT DETECTED Final   Candida tropicalis NOT DETECTED NOT DETECTED Final         Radiology Studies: No results found.      Scheduled Meds: . amLODipine  5 mg Oral Daily  . calcium acetate  667 mg Oral TID WC  .  ceFAZolin (ANCEF) IV  2 g Intravenous Q T,Th,Sa-HD  . cinacalcet  60 mg Oral Q breakfast  . doxercalciferol  4 mcg Intravenous Q T,Th,Sa-HD  . famotidine  10 mg Oral Daily  . feeding supplement (PRO-STAT SUGAR FREE 64)  30 mL Oral BID  . hydrALAZINE  10 mg Oral TID  . isosorbide mononitrate  60 mg Oral Daily  . lanthanum  2,000 mg Oral TID WC  . mirtazapine  15 mg Oral QHS  . multivitamin  1 tablet Oral QHS  . pantoprazole  40 mg Oral Daily  . sodium chloride flush  3 mL Intravenous Q12H  . sodium chloride flush  3 mL Intravenous Q12H  . [START ON 02/22/2016]  valACYclovir  500 mg Oral Q48H   Continuous Infusions:     LOS: 3 days    Time spent: 30 minutes.    [facility], MD [facility] Pager [phone]  If 7PM-7AM, please contact night-coverage [email or web address] Password [email or web address] 02/21/2016, 2:18 PM

## 2016-02-21 NOTE — Progress Notes (Signed)
[facility] Progress Note   Subjective: issues noted- Tmax 100.7.  MS maybe a little worse than normal- yesterday she seemed to be acting her norm- but today not able to find words "something on my legs is tight"   Filed Vitals:   02/20/16 1731 02/20/16 1735 02/20/16 2058 02/21/16 0500  BP: 159/143 167/120 132/69 146/84  Pulse: 91  91 82  Temp: 98.7 F (37.1 C)  100.7 F (38.2 C) 98.5 F (36.9 C)  TempSrc: Axillary  Oral Oral  Resp:   18 18  Height:      Weight:    58.06 kg (128 lb)  SpO2: 97%  97% 99%    Inpatient medications: . amLODipine  5 mg Oral Daily  . calcium acetate  667 mg Oral TID WC  .  ceFAZolin (ANCEF) IV  2 g Intravenous Q T,Th,Sa-HD  . cinacalcet  60 mg Oral Q breakfast  . doxercalciferol  4 mcg Intravenous Q T,Th,Sa-HD  . famotidine  10 mg Oral Daily  . feeding supplement (PRO-STAT SUGAR FREE 64)  30 mL Oral BID  . hydrALAZINE  10 mg Oral TID  . isosorbide mononitrate  60 mg Oral Daily  . lanthanum  2,000 mg Oral TID WC  . mirtazapine  15 mg Oral QHS  . multivitamin  1 tablet Oral QHS  . pantoprazole  40 mg Oral Daily  . sodium chloride flush  3 mL Intravenous Q12H  . sodium chloride flush  3 mL Intravenous Q12H  . valACYclovir  500 mg Oral Daily     sodium chloride, acetaminophen **OR** acetaminophen, bisacodyl, hydrALAZINE, HYDROcodone-acetaminophen, ondansetron **OR** ondansetron (ZOFRAN) IV, prochlorperazine, sodium chloride flush, zolpidem  Exam: Alert, pressured speech- agitated - not sure where she is  No jvd Chest clear bilat to bases RRR 2/6 sem Abd soft ntnd Ext no LE edema; both shoulder painful w palpation L> R Neuro limited movement L > R UE prob due to joint pain LUA AVF +bruit Alert, Ox 3  Dialysis: TTS SW  3h 46min  57kg  2/2.25 bath   Hep none  LUA AVF Aranesp 60 mcg IV weekly (last dose 02/16/16 HGB 11.1) Hectoral 4 mcg IV q treatment (Ca 7.7 C Ca 8.2 PTH 396 01/26/16 phos 5.7  02/09/16)   Assessment: 1. Neutropenia/ fevers -  Group b strep bacteremia - on ancef and valtrex per pharmacy and primary - we can give ancef at OP dialysis  2. L sided weakness - felt to be due to pain/ MS origin. Neuro saw- rec getting MRI as she could not cooperate with exam or MRI  3. HTN'sive urgency - resolved on amlodipine and hydralazine  4. ESRD - TTS HD- will plan for today  via AVF 5. Vol - try for EDW- better after HD on Sat 6. Anemia - HGB now 10.5-  No ESA for now.  7. Metabolic bone disease - Continue binders- fosrenol and phoslo, VDRA, sensipar 8. Nutrition - Albumin 2.7. Renal diet/prostat/renal vit 9. Myeloma- recent chemoRx at [facility] 4-5 d ago- neutropenic- received stimulant - WBC climbing  10. MS- not normal at baseline from what I had observed at kidney center in past- but seems worse this AM-could acute illness/stressful situation be affecting?? Also maybe some valtrex affect- will decrease dose to every other day which still should afford protection- hd may help clear if that is why   Brice Potteiger A   02/21/2016, 8:49 AM    Recent Labs Lab 02/17/16 2150 02/18/16 0330 02/19/16  0427  NA 130* 128* 131*  K 5.2* 5.3* 4.4  CL 92* 91* 93*  CO2 26 26 26   GLUCOSE 110* 112* 109*  BUN 39* 44* 20  CREATININE 6.60* 7.25* 4.29*  CALCIUM 8.4* 9.0 8.0*  PHOS  --  6.4*  --     Recent Labs Lab 02/18/16 0330  AST 21  ALT 16  ALKPHOS 60  BILITOT 1.0  PROT 7.7  ALBUMIN 2.7*    Recent Labs Lab 02/17/16 2106  02/19/16 0427 02/20/16 0431 02/21/16 0328  WBC 2.2*  < > 0.8* 1.2* 2.1*  NEUTROABS 1.6*  --   --  0.3* 0.9*  HGB 12.3  < > 11.3* 11.2* 10.5*  HCT 36.7  < > 34.4* 34.0* 32.3*  MCV 106.1*  < > 106.5* 106.3* 105.9*  PLT 164  < > 164 160 184  < > = values in this interval not displayed.

## 2016-02-22 LAB — CBC WITH DIFFERENTIAL/PLATELET
Basophils Absolute: 0 10*3/uL (ref 0.0–0.1)
Basophils Relative: 0 %
EOS PCT: 0 %
Eosinophils Absolute: 0 10*3/uL (ref 0.0–0.7)
HCT: 32.7 % — ABNORMAL LOW (ref 36.0–46.0)
Hemoglobin: 10.3 g/dL — ABNORMAL LOW (ref 12.0–15.0)
LYMPHS ABS: 0.2 10*3/uL — AB (ref 0.7–4.0)
Lymphocytes Relative: 9 %
MCH: 34.4 pg — AB (ref 26.0–34.0)
MCHC: 31.5 g/dL (ref 30.0–36.0)
MCV: 109.4 fL — AB (ref 78.0–100.0)
MONOS PCT: 33 %
Monocytes Absolute: 0.8 10*3/uL (ref 0.1–1.0)
NEUTROS ABS: 1.3 10*3/uL — AB (ref 1.7–7.7)
Neutrophils Relative %: 58 %
PLATELETS: 200 10*3/uL (ref 150–400)
RBC: 2.99 MIL/uL — ABNORMAL LOW (ref 3.87–5.11)
RDW: 14.7 % (ref 11.5–15.5)
WBC: 2.3 10*3/uL — ABNORMAL LOW (ref 4.0–10.5)

## 2016-02-22 LAB — CULTURE, BLOOD (ROUTINE X 2)

## 2016-02-22 LAB — POTASSIUM: Potassium: 4.3 mmol/L (ref 3.5–5.1)

## 2016-02-22 NOTE — Progress Notes (Signed)
PROGRESS NOTE  [name]  EXB:[id number] DOB: March 16, 1941  DOA: 02/17/2016 PCP: Benito Mccreedy, MD   Brief Narrative:  75 year old female with ESRD on TTS HD, multiple myeloma, HTN, GERD, anemia of chronic disease, malnutrition, presented to [facility] ED on 5/19 with complaints of left shoulder and knee pain which began after HD the day prior. Unable to clearly say if she was weak on that side. Also complained of nausea, vomiting and diarrhea. No acute issues as per OP dialysis staff. Neurology evaluated in ED and did not believe that she had a CVA. Markedly elevated blood pressure on arrival 171/151-briefly on Cardene drip which was weaned off when blood pressures improved. As per nephrology, history of behavioral issues and resident of SNF. X-rays of left shoulder and knee without fractures. Admitted for hypertensive urgency-resolved. Ruled in for group B strep bacteremia and UTI. At baseline seems to have some behavioral issues (daughter however denies) but fluctuating and somewhat worse in the hospital. Patient did not cooperate for MRI brain. Nephrology following.   Assessment & Plan:   Active Problems:   ESRD (end stage renal disease) ([place])   Multiple myeloma (HCC)   Hypertension associatd with end stage renal disease on dialysis   Hypertensive urgency   Swelling of joint of left knee   Weakness  Neutropenic fever/group B strep bacteremia and sepsis from UTI - On IV Ancef which can be given across HD. Duration to be determined prior to discharge. - Chest x-ray without acute findings. Bilateral knees and shoulder exam not consistent with acute infectious arthritis. Seem chronic changes. - Patient is immunocompromised from multiple myeloma and its treatment. - recurrent fever, repeat blood cultures ordered.   Hypertensive urgency - Initial blood pressure 171/151. Treated briefly in the ED with Cardene drip and initiated her home oral antihypertensive regimen. Blood pressures improved and  weaned off Cardene drip. - Continue amlodipine, hydralazine, Imdur. - Blood pressures controlled  B/L shoulders/knee pain and ?? Weakness - CT head without acute findings. No obvious focal deficits. Patient not fully cooperating with assessment-some of this may be related to pain. - X-rays of left shoulder without acute finding. X-ray of left knee shows joint effusion, probable loose body, tricompartmental osteoarthritis. Uric acid: Normal. No acute arthritis findings on physical exam. - Denies history of fall. - ? All related to osteoarthritis versus skeletal metastases from multiple myeloma. - Neurology consultation appreciated: Patient not cooperative to physical exam. However asymmetrical weakness felt to be more related to pain than stroke. Patient did not cooperate with MRI despite Ativan on 5/21. - Discussed with [name], oncology on 5/21 who indicated that patient has refractory multiple myeloma and has very poor prognosis. She recommended steroids for bony pain (however not started due to infection in immunosuppressed patients) or oxycodone (as per RN, patient refuses). She recommended palliative care consultation- discussed with daughter on 5/22 and she wishes to think it over before deciding.  ESRD on TTS HD/mild hyperkalemia - Nephrology follow-up appreciated. Hyperkalemia resolved. Status post HD on 5/20.  Pancytopenia - Probably multifactorial related to multiple myeloma and ESRD.  - Anemia stable. Thrombocytopenia resolved. Received last chemotherapy and Granix on 5/17-WBC may get worse before getting better. Follow daily CBC with differential. WBC 2.1/ANC 0.9  Relapsed and refractory multiple myeloma - No obvious fractures on x-rays of left shoulder and knee.  - Followed at [facility]. Reviewed last progress note from 02/01/16 in care everywhere: No signs or symptoms to suggest he sees progression. Patient determined to continue  aggressive treatment. Chemotherapy was held  on that day due to [facility] 0.3 and she has had issues with neutropenia and requires G-CSF support. She received bortezomib, granix, cyclophosphamide on 5/17 at [facility]. - Please see above regarding discussion with oncology  History of behavioral issues/confusion/altered mental status - Monitor. Current ongoing issues. - As per Dr. Moshe Cipro, Nephrology who is familiar with patient, on 5/22 patient seemed to be acting her normal but on 5/23 MS seemed worse. Etiology? Acute illness, stressful situation affecting underlying cognitive impairment,? Valtrex-reducing dose and should clear with HD. If does not improve,? Consider MRI with sedation (discuss with neurology).  Chronic hyponatremia - May be related to ESRD and multiple myeloma. Sodium seems to be at its baseline. Management per nephrology.   DVT prophylaxis: SCDs Code Status: Full Family Communication: Discussed with patient. Marland Kitchen Updated care and answered questions. Discussed about palliative care-she wishes to think about it before making a decision. Disposition Plan: DC to SNF when medically stable.   Consultants:   Nephrology  Neurology  Procedures:   HD  Antimicrobials:   Valacyclovir 5/20 >  Cefepime 5/21 >5/23  Ancef 5/23  Subjective: She wants her appetite to be improved.   Objective:  Filed Vitals:   02/21/16 1830 02/21/16 1850 02/22/16 0430 02/22/16 0530  BP: 139/90 147/89 117/74   Pulse: 74 81 90   Temp:  98.9 F (37.2 C) 101 F (38.3 C) 98.5 F (36.9 C)  TempSrc:  Oral Oral Oral  Resp:  16 16   Height:      Weight:  55.6 kg (122 lb 9.2 oz) 56.4 kg (124 lb 5.4 oz)   SpO2:  98% 94%     Intake/Output Summary (Last 24 hours) at 02/22/16 1019 Last data filed at 02/22/16 1000  Gross per 24 hour  Intake    150 ml  Output   1180 ml  Net  -1030 ml   Filed Weights   02/21/16 1455 02/21/16 1850 02/22/16 0430  Weight: 56.8 kg (125 lb 3.5 oz) 55.6 kg (122 lb 9.2 oz) 56.4 kg (124 lb 5.4 oz)     Examination:  General exam: Pleasant elderly female without any complaints.  Respiratory system: Clear to auscultation. Respiratory effort normal. Cardiovascular system: S1 & S2 heard, RRR. No JVD, murmurs, rubs, gallops or clicks. No pedal edema.  Gastrointestinal system: Abdomen is nondistended, soft and nontender. No organomegaly or masses felt. Normal bowel sounds heard. Central nervous system: Alert and oriented self only. No focal neurological deficits. Extremities: Chronic boggy swelling of both knees without acute findings. B/l shoulders without acute findings but some restricted painful ROM. Left upper extremity AV fistula. Patient not fully cooperating with power assessment. Skin: No rashes, lesions or ulcers Psychiatry: Judgement and insight appear poor. Mood & affect appropriate.     Data Reviewed: I have personally reviewed following labs and imaging studies  CBC:  Recent Labs Lab 02/17/16 2106 02/18/16 0330 02/19/16 0427 02/20/16 0431 02/21/16 0328 02/22/16 0400  WBC 2.2* 2.2* 0.8* 1.2* 2.1* 2.3*  NEUTROABS 1.6*  --   --  0.3* 0.9* 1.3*  HGB 12.3 11.3* 11.3* 11.2* 10.5* 10.3*  HCT 36.7 35.0* 34.4* 34.0* 32.3* 32.7*  MCV 106.1* 107.0* 106.5* 106.3* 105.9* 109.4*  PLT 164 131* 164 160 184 366   Basic Metabolic Panel:  Recent Labs Lab 02/17/16 2150 02/18/16 0330 02/19/16 0427 02/21/16 1511  NA 130* 128* 131* 130*  K 5.2* 5.3* 4.4 5.5*  CL 92* 91* 93* 92*  CO2 _0 GLUCOSE 110* 112* 109* 76  BUN 39* 44* 20 70*  CREATININE 6.60* 7.25* 4.29* 8.59*  CALCIUM 8.4* 9.0 8.0* 8.0*  MG  --  2.1  --   --   PHOS  --  6.4*  --  7.2*   GFR: Estimated Creatinine Clearance: 5 mL/min (by C-G formula based on Cr of 8.59). Liver Function Tests:  Recent Labs Lab 02/18/16 0330 02/21/16 1511  AST 21  --   ALT 16  --   ALKPHOS 60  --   BILITOT 1.0  --   PROT 7.7  --   ALBUMIN 2.7* 2.1*   No results for input(s): LIPASE, AMYLASE in the last 168  hours. No results for input(s): AMMONIA in the last 168 hours. Coagulation Profile: No results for input(s): INR, PROTIME in the last 168 hours. Cardiac Enzymes: No results for input(s): CKTOTAL, CKMB, CKMBINDEX, TROPONINI in the last 168 hours. BNP (last 3 results) No results for input(s): PROBNP in the last 8760 hours. HbA1C: No results for input(s): HGBA1C in the last 72 hours. CBG: No results for input(s): GLUCAP in the last 168 hours. Lipid Profile: No results for input(s): CHOL, HDL, LDLCALC, TRIG, CHOLHDL, LDLDIRECT in the last 72 hours. Thyroid Function Tests: No results for input(s): TSH, T4TOTAL, FREET4, T3FREE, THYROIDAB in the last 72 hours. Anemia Panel: No results for input(s): VITAMINB12, FOLATE, FERRITIN, TIBC, IRON, RETICCTPCT in the last 72 hours.  Sepsis Labs: No results for input(s): PROCALCITON, LATICACIDVEN in the last 168 hours.  Recent Results (from the past 240 hour(s))  Urine culture     Status: Abnormal   Collection Time: 02/18/16  2:45 AM  Result Value Ref Range Status   Specimen Description URINE, CLEAN CATCH  Final   Special Requests NONE  Final   Culture (A)  Final    >=100,000 COLONIES/mL GROUP B STREP(S.AGALACTIAE)ISOLATED TESTING AGAINST S. AGALACTIAE NOT ROUTINELY PERFORMED DUE TO PREDICTABILITY OF AMP/PEN/VAN SUSCEPTIBILITY.    Report Status 02/19/2016 FINAL  Final  MRSA PCR Screening     Status: None   Collection Time: 02/18/16  6:29 PM  Result Value Ref Range Status   MRSA by PCR NEGATIVE NEGATIVE Final    Comment:        The GeneXpert MRSA Assay (FDA approved for NASAL specimens only), is one component of a comprehensive MRSA colonization surveillance program. It is not intended to diagnose MRSA infection nor to guide or monitor treatment for MRSA infections.   Culture, blood (routine x 2)     Status: Abnormal (Preliminary result)   Collection Time: 02/18/16  9:40 PM  Result Value Ref Range Status   Specimen Description BLOOD  HEMODIALYSIS CATHETER  Final   Special Requests BOTTLES DRAWN AEROBIC AND ANAEROBIC 10CC  Final   Culture  Setup Time   Final    GRAM POSITIVE COCCI IN CHAINS IN BOTH AEROBIC AND ANAEROBIC BOTTLES CRITICAL RESULT CALLED TO, READ BACK BY AND VERIFIED WITH: C DAVIS,RN AT 1144 02/19/16 BY L BENFIELD    Culture (A)  Final    GROUP B STREP(S.AGALACTIAE)ISOLATED SUSCEPTIBILITIES TO FOLLOW    Report Status PENDING  Incomplete  Culture, blood (routine x 2)     Status: Abnormal (Preliminary result)   Collection Time: 02/18/16  9:50 PM  Result Value Ref Range Status   Specimen Description BLOOD HEMODIALYSIS CATHETER  Final   Special Requests BOTTLES DRAWN AEROBIC AND ANAEROBIC 10CC   Final   Culture  Setup Time  Final    GRAM POSITIVE COCCI IN CHAINS IN BOTH AEROBIC AND ANAEROBIC BOTTLES Organism ID to follow CRITICAL RESULT CALLED TO, READ BACK BY AND VERIFIED WITH: Beulah Gandy D AT 1102 02/19/16 BY L BENFIELD    Culture (A)  Final    GROUP B STREP(S.AGALACTIAE)ISOLATED SUSCEPTIBILITIES TO FOLLOW    Report Status PENDING  Incomplete  Blood Culture ID Panel (Reflexed)     Status: Abnormal   Collection Time: 02/18/16  9:50 PM  Result Value Ref Range Status   Enterococcus species NOT DETECTED NOT DETECTED Final   Vancomycin resistance NOT DETECTED NOT DETECTED Final   Listeria monocytogenes NOT DETECTED NOT DETECTED Final   Staphylococcus species NOT DETECTED NOT DETECTED Final   Staphylococcus aureus NOT DETECTED NOT DETECTED Final   Methicillin resistance NOT DETECTED NOT DETECTED Final   Streptococcus species DETECTED (A) NOT DETECTED Final    Comment: CRITICAL RESULT CALLED TO, READ BACK BY AND VERIFIED WITH: Beulah Gandy D AT 1102 02/19/16 BY L BENFIELD    Streptococcus agalactiae DETECTED (A) NOT DETECTED Final    Comment: CRITICAL RESULT CALLED TO, READ BACK BY AND VERIFIED WITH: Beulah Gandy D AT 1102 02/19/16 BY L BENFIELD    Streptococcus pneumoniae NOT DETECTED NOT DETECTED  Final   Streptococcus pyogenes NOT DETECTED NOT DETECTED Final   Acinetobacter baumannii NOT DETECTED NOT DETECTED Final   Enterobacteriaceae species NOT DETECTED NOT DETECTED Final   Enterobacter cloacae complex NOT DETECTED NOT DETECTED Final   Escherichia coli NOT DETECTED NOT DETECTED Final   Klebsiella oxytoca NOT DETECTED NOT DETECTED Final   Klebsiella pneumoniae NOT DETECTED NOT DETECTED Final   Proteus species NOT DETECTED NOT DETECTED Final   Serratia marcescens NOT DETECTED NOT DETECTED Final   Carbapenem resistance NOT DETECTED NOT DETECTED Final   Haemophilus influenzae NOT DETECTED NOT DETECTED Final   Neisseria meningitidis NOT DETECTED NOT DETECTED Final   Pseudomonas aeruginosa NOT DETECTED NOT DETECTED Final   Candida albicans NOT DETECTED NOT DETECTED Final   Candida glabrata NOT DETECTED NOT DETECTED Final   Candida krusei NOT DETECTED NOT DETECTED Final   Candida parapsilosis NOT DETECTED NOT DETECTED Final   Candida tropicalis NOT DETECTED NOT DETECTED Final         Radiology Studies: No results found.      Scheduled Meds: . amLODipine  5 mg Oral Daily  . calcium acetate  667 mg Oral TID WC  .  ceFAZolin (ANCEF) IV  2 g Intravenous Q T,Th,Sa-HD  . cinacalcet  60 mg Oral Q breakfast  . doxercalciferol  4 mcg Intravenous Q T,Th,Sa-HD  . famotidine  10 mg Oral Daily  . feeding supplement (PRO-STAT SUGAR FREE 64)  30 mL Oral BID  . hydrALAZINE  10 mg Oral TID  . isosorbide mononitrate  60 mg Oral Daily  . lanthanum  2,000 mg Oral TID WC  . mirtazapine  15 mg Oral QHS  . multivitamin  1 tablet Oral QHS  . pantoprazole  40 mg Oral Daily  . sodium chloride flush  3 mL Intravenous Q12H  . sodium chloride flush  3 mL Intravenous Q12H  . valACYclovir  500 mg Oral Q48H   Continuous Infusions:     LOS: 4 days    Time spent: 30 minutes.    Hosie Poisson, MD [facility] Pager [phone] If 7PM-7AM, please contact  night-coverage [email or web address] Password [facility] 02/22/2016, 10:19 AM

## 2016-02-22 NOTE — Progress Notes (Signed)
[name] Progress Note   Subjective: HD last night removed 1100- Tmax 101- MS seems better from yesterday   Filed Vitals:   02/21/16 1830 02/21/16 1850 02/22/16 0430 02/22/16 0530  BP: 139/90 147/89 117/74   Pulse: 74 81 90   Temp:  98.9 F (37.2 C) 101 F (38.3 C) 98.5 F (36.9 C)  TempSrc:  Oral Oral Oral  Resp:  16 16   Height:      Weight:  55.6 kg (122 lb 9.2 oz) 56.4 kg (124 lb 5.4 oz)   SpO2:  98% 94%     Inpatient medications: . amLODipine  5 mg Oral Daily  . calcium acetate  667 mg Oral TID WC  .  ceFAZolin (ANCEF) IV  2 g Intravenous Q T,Th,Sa-HD  . cinacalcet  60 mg Oral Q breakfast  . doxercalciferol  4 mcg Intravenous Q T,Th,Sa-HD  . famotidine  10 mg Oral Daily  . feeding supplement (PRO-STAT SUGAR FREE 64)  30 mL Oral BID  . hydrALAZINE  10 mg Oral TID  . isosorbide mononitrate  60 mg Oral Daily  . lanthanum  2,000 mg Oral TID WC  . mirtazapine  15 mg Oral QHS  . multivitamin  1 tablet Oral QHS  . pantoprazole  40 mg Oral Daily  . sodium chloride flush  3 mL Intravenous Q12H  . sodium chloride flush  3 mL Intravenous Q12H  . valACYclovir  500 mg Oral Q48H     sodium chloride, acetaminophen **OR** acetaminophen, bisacodyl, hydrALAZINE, HYDROcodone-acetaminophen, ondansetron **OR** ondansetron (ZOFRAN) IV, prochlorperazine, simethicone, sodium chloride flush, zolpidem  Exam: Alert, pressured speech- agitated - not sure where she is  No jvd Chest clear bilat to bases RRR 2/6 sem Abd soft ntnd Ext no LE edema; both shoulder painful w palpation L> R Neuro limited movement L > R UE prob due to joint pain LUA AVF +bruit Alert, Ox 3  Dialysis: TTS SW  3h 62min  57kg  2/2.25 bath   Hep none  LUA AVF Aranesp 60 mcg IV weekly (last dose 02/16/16 HGB 11.1) Hectoral 4 mcg IV q treatment (Ca 7.7 C Ca 8.2 PTH 396 01/26/16 phos 5.7 02/09/16)   Assessment: 1. Neutropenia/ fevers -  Group b strep bacteremia - on ancef per pharmacy and  primary - we can give ancef at OP dialysis  2. L sided weakness - felt to be due to pain/ MS origin. Neuro saw- rec getting MRI but she could not cooperate with exam or MRI  3. HTN'sive urgency - resolved on amlodipine and hydralazine  4. ESRD - TTS HD- will plan for tomorrow  via AVF 5. Vol - try for EDW- better after HD 6. Anemia - HGB now 10.3-  No ESA for now.  7. Metabolic bone disease - Continue binders- fosrenol and phoslo, VDRA, sensipar- phos of 7.2- suspect she is not taking here  8. Nutrition - Albumin 2.7. Renal diet/prostat/renal vit 9. Myeloma- recent chemoRx at [facility] 4-5 d ago- neutropenic- received stimulant - WBC climbing  10. MS- is not normal at baseline from what I had observed at kidney center in past- but seemed worse yest, better today-could acute illness/stressful situation be affecting?? Also maybe some valtrex affect- have decreased dose to every other day   [name] A   02/22/2016, 8:35 AM    Recent Labs Lab 02/18/16 0330 02/19/16 0427 02/21/16 1511  NA 128* 131* 130*  K 5.3* 4.4 5.5*  CL 91* 93* 92*  CO2  26 26 23   GLUCOSE 112* 109* 76  BUN 44* 20 70*  CREATININE 7.25* 4.29* 8.59*  CALCIUM 9.0 8.0* 8.0*  PHOS 6.4*  --  7.2*    Recent Labs Lab 02/18/16 0330 02/21/16 1511  AST 21  --   ALT 16  --   ALKPHOS 60  --   BILITOT 1.0  --   PROT 7.7  --   ALBUMIN 2.7* 2.1*    Recent Labs Lab 02/20/16 0431 02/21/16 0328 02/22/16 0400  WBC 1.2* 2.1* 2.3*  NEUTROABS 0.3* 0.9* 1.3*  HGB 11.2* 10.5* 10.3*  HCT 34.0* 32.3* 32.7*  MCV 106.3* 105.9* 109.4*  PLT 160 184 200

## 2016-02-22 NOTE — Progress Notes (Signed)
BP 95/57 held hydralazine, Dr. Karleen Hampshire made aware. Pt resting comfortably at this time. Will continue to monitor

## 2016-02-22 NOTE — Care Management Important Message (Signed)
Important Message  Patient Details  Name: [name] MRN: [id number] Date of Birth: 10/22/1940   Medicare Important Message Given:  Yes    Loann Quill 02/22/2016, 11:25 AM

## 2016-02-22 NOTE — Progress Notes (Signed)
Report received via Apolonio Schneiders RN using SBAR format, reviewed VS, tests and patient's general condition, assumed care of patient.

## 2016-02-23 LAB — RENAL FUNCTION PANEL
Albumin: 1.9 g/dL — ABNORMAL LOW (ref 3.5–5.0)
Anion gap: 12 (ref 5–15)
BUN: 62 mg/dL — ABNORMAL HIGH (ref 6–20)
CALCIUM: 8.4 mg/dL — AB (ref 8.9–10.3)
CHLORIDE: 92 mmol/L — AB (ref 101–111)
CO2: 24 mmol/L (ref 22–32)
CREATININE: 6.86 mg/dL — AB (ref 0.44–1.00)
GFR, EST AFRICAN AMERICAN: 6 mL/min — AB (ref >60)
GFR, EST NON AFRICAN AMERICAN: 5 mL/min — AB (ref >60)
Glucose, Bld: 100 mg/dL — ABNORMAL HIGH (ref 65–99)
Phosphorus: 5.8 mg/dL — ABNORMAL HIGH (ref 2.5–4.6)
Potassium: 4.5 mmol/L (ref 3.5–5.1)
SODIUM: 128 mmol/L — AB (ref 135–145)

## 2016-02-23 LAB — CBC
HCT: 30.3 % — ABNORMAL LOW (ref 36.0–46.0)
Hemoglobin: 9.6 g/dL — ABNORMAL LOW (ref 12.0–15.0)
MCH: 34.5 pg — ABNORMAL HIGH (ref 26.0–34.0)
MCHC: 31.7 g/dL (ref 30.0–36.0)
MCV: 109 fL — AB (ref 78.0–100.0)
PLATELETS: 222 10*3/uL (ref 150–400)
RBC: 2.78 MIL/uL — AB (ref 3.87–5.11)
RDW: 14.7 % (ref 11.5–15.5)
WBC: 3.1 10*3/uL — AB (ref 4.0–10.5)

## 2016-02-23 MED ORDER — DOXERCALCIFEROL 4 MCG/2ML IV SOLN
INTRAVENOUS | Status: AC
  Filled 2016-02-23: qty 2

## 2016-02-23 MED ORDER — CEFAZOLIN SODIUM-DEXTROSE 2-4 GM/100ML-% IV SOLN
2.0000 g | INTRAVENOUS | Status: DC
  Filled 2016-02-23: qty 100

## 2016-02-23 MED ORDER — CEFAZOLIN SODIUM-DEXTROSE 2-4 GM/100ML-% IV SOLN
2.0000 g | INTRAVENOUS | Status: DC
  Administered 2016-02-23 – 2016-02-28 (×3): 2 g via INTRAVENOUS
  Filled 2016-02-23 (×8): qty 100

## 2016-02-23 MED ORDER — DARBEPOETIN ALFA 60 MCG/0.3ML IJ SOSY
60.0000 ug | PREFILLED_SYRINGE | INTRAMUSCULAR | Status: DC
  Administered 2016-02-23 – 2016-03-01 (×2): 60 ug via INTRAVENOUS
  Filled 2016-02-23: qty 0.3

## 2016-02-23 MED ORDER — DARBEPOETIN ALFA 60 MCG/0.3ML IJ SOSY
PREFILLED_SYRINGE | INTRAMUSCULAR | Status: AC
  Filled 2016-02-23: qty 0.3

## 2016-02-23 MED ORDER — ACETAMINOPHEN 325 MG PO TABS
325.0000 mg | ORAL_TABLET | Freq: Once | ORAL | Status: AC
  Administered 2016-02-23: 325 mg via ORAL
  Filled 2016-02-23: qty 1

## 2016-02-23 MED ORDER — SODIUM CHLORIDE 0.9 % IV BOLUS (SEPSIS)
500.0000 mL | Freq: Once | INTRAVENOUS | Status: AC
  Administered 2016-02-23: 500 mL via INTRAVENOUS

## 2016-02-23 NOTE — Progress Notes (Signed)
PT Cancellation Note  Patient Details Name: [name] MRN: [id number] DOB: 1941-08-09   Cancelled Treatment:     pt in HD, will see again as schedule permits  [facility] 02/23/2016, 8:27 AM

## 2016-02-23 NOTE — Progress Notes (Signed)
PROGRESS NOTE  [name]  WFU:[id number] DOB: [birth year]  DOA: 02/17/2016 PCP: Benito Mccreedy, MD   Brief Narrative:  75 year old female with ESRD on TTS HD, multiple myeloma, HTN, GERD, anemia of chronic disease, malnutrition, presented to [facility] ED on 5/19 with complaints of left shoulder and knee pain which began after HD the day prior. Unable to clearly say if she was weak on that side. Also complained of nausea, vomiting and diarrhea. No acute issues as per OP dialysis staff. Neurology evaluated in ED and did not believe that she had a CVA. Markedly elevated blood pressure on arrival 171/151-briefly on Cardene drip which was weaned off when blood pressures improved. As per nephrology, history of behavioral issues and resident of SNF. X-rays of left shoulder and knee without fractures. Admitted for hypertensive urgency-resolved. Ruled in for group B strep bacteremia and UTI. At baseline seems to have some behavioral issues (daughter however denies) but fluctuating and somewhat worse in the hospital. Patient did not cooperate for MRI brain. Nephrology following.   Assessment & Plan:   Active Problems:   ESRD (end stage renal disease) ([place])   Multiple myeloma (H[facility])   Hypertension associatd with end stage renal disease on dialysis   Hypertensive urgency   Swelling of joint of left knee   Weakness  Neutropenic fever/group B strep bacteremia and sepsis from UTI - On IV Ancef which can be given across HD. Duration to be determined prior to discharge. - Chest x-ray without acute findings. Bilateral knees and shoulder exam not consistent with acute infectious arthritis. Seem chronic changes. - Patient is immunocompromised from multiple myeloma and its treatment. - recurrent fever, repeat blood cultures ordered and they have been negative so far. Would continue with ancef. Will request for ID consult for recurrent fevers.   Hypertensive urgency - Initial blood pressure 171/151. Treated briefly  in the ED with Cardene drip and initiated her home oral antihypertensive regimen. Blood pressures improved and weaned off Cardene drip. - Continue amlodipine, hydralazine, Imdur. - Blood pressures controlled  B/L shoulders/knee pain and ?? Weakness - CT head without acute findings. No obvious focal deficits.  - X-rays of left shoulder without acute finding. X-ray of left knee shows joint effusion, probable loose body, tricompartmental osteoarthritis. Uric acid: Normal. No acute arthritis findings on physical exam. - Denies history of fall. - ? All related to osteoarthritis versus skeletal metastases from multiple myeloma. - Neurology consultation appreciated:Marland Kitchen However asymmetrical weakness felt to be more related to pain than stroke. Patient did not cooperate with MRI despite Ativan on 5/21. - Discussed with [name], oncology on 5/21 who indicated that patient has refractory multiple myeloma and has very poor prognosis. She recommended steroids for bony pain (however not started due to infection in immunosuppressed patients) or oxycodone (as per RN, patient refuses). She recommended palliative care consultation- discussed with daughter on 5/22 and she does not want to think about palliative care at this time.   ESRD on TTS HD/mild hyperkalemia - Nephrology follow-up appreciated. Hyperkalemia resolved. Status post HD on 5/20.  Pancytopenia - Probably multifactorial related to multiple myeloma and ESRD.  - Anemia stable. Thrombocytopenia resolved. Received last chemotherapy and Granix on 5/17-WBC may get worse before getting better. Follow daily CBC with differential. Improving WBC count.  Relapsed and refractory multiple myeloma - No obvious fractures on x-rays of left shoulder and knee.  - Followed at [facility]. Reviewed last progress note from 02/01/16 in care everywhere: No signs or symptoms to suggest progression.  Patient determined to continue aggressive treatment. Chemotherapy was  held on that day due to [facility] of 0.3 and she has had issues with neutropenia and requires G-CSF support. She received bortezomib, granix, cyclophosphamide on 5/17 at [facility]. - Please see above regarding discussion with oncology  History of behavioral issues/confusion/altered mental status - Monitor. Current ongoing issues. - As per Dr. Moshe Cipro, Nephrology who is familiar with patient, on 5/22 patient seemed to be acting her normal but on 5/23 MS seemed worse. Etiology unclear. Her valtrex dose was reduced. Today she is alert , less confused.   Chronic hyponatremia - May be related to ESRD and multiple myeloma.decreased to 128 today. . Management per nephrology.   DVT prophylaxis: SCDs Code Status: Full Family Communication: Discussed with patient. Marland Kitchen Updated care and answered questions. Discussed about palliative care-she wishes to think about it before making a decision. Disposition Plan: DC to SNF when medically stable.   Consultants:   Nephrology  Neurology  Procedures:   HD  Antimicrobials:   Valacyclovir 5/20 >  Cefepime 5/21 >5/23  Ancef 5/23  Subjective: She was sleepy today. Allowed me to examine her.   Objective:  Filed Vitals:   02/23/16 1030 02/23/16 1100 02/23/16 1113 02/23/16 1354  BP: 108/80 108/65 104/60 118/78  Pulse: 90 87 80 67  Temp:    101.3 F (38.5 C)  TempSrc:    Oral  Resp: '15 14 16 16  ' Height:      Weight:   51.8 kg (114 lb 3.2 oz)   SpO2:    99%    Intake/Output Summary (Last 24 hours) at 02/23/16 1558 Last data filed at 02/23/16 1300  Gross per 24 hour  Intake    600 ml  Output   2000 ml  Net  -1400 ml   Filed Weights   02/23/16 0430 02/23/16 0700 02/23/16 1113  Weight: 56.8 kg (125 lb 3.5 oz) 53.8 kg (118 lb 9.7 oz) 51.8 kg (114 lb 3.2 oz)    Examination:  General exam: Pleasant elderly female without any complaints.  Respiratory system: Clear to auscultation. Respiratory effort normal. Cardiovascular system:  S1 & S2 heard, RRR. No JVD, murmurs, rubs, gallops or clicks. No pedal edema.  Gastrointestinal system: Abdomen is nondistended, soft and nontender. No organomegaly or masses felt. Normal bowel sounds heard. Central nervous system: Alert and oriented self only. No focal neurological deficits. Extremities: swelling of both knees, tender to touch. Skin: No rashes, lesions or ulcers Psychiatry: Judgement and insight appear poor. Mood & affect appropriate.     Data Reviewed: I have personally reviewed following labs and imaging studies  CBC:  Recent Labs Lab 02/17/16 2106  02/19/16 0427 02/20/16 0431 02/21/16 0328 02/22/16 0400 02/23/16 0803  WBC 2.2*  < > 0.8* 1.2* 2.1* 2.3* 3.1*  NEUTROABS 1.6*  --   --  0.3* 0.9* 1.3*  --   HGB 12.3  < > 11.3* 11.2* 10.5* 10.3* 9.6*  HCT 36.7  < > 34.4* 34.0* 32.3* 32.7* 30.3*  MCV 106.1*  < > 106.5* 106.3* 105.9* 109.4* 109.0*  PLT 164  < > 164 160 184 200 222  < > = values in this interval not displayed. Basic Metabolic Panel:  Recent Labs Lab 02/17/16 2150 02/18/16 0330 02/19/16 0427 02/21/16 1511 02/22/16 1005 02/23/16 0803  NA 130* 128* 131* 130*  --  128*  K 5.2* 5.3* 4.4 5.5* 4.3 4.5  CL 92* 91* 93* 92*  --  92*  CO2 26  '26 26 23  ' --  24  GLUCOSE 110* 112* 109* 76  --  100*  BUN 39* 44* 20 70*  --  62*  CREATININE 6.60* 7.25* 4.29* 8.59*  --  6.86*  CALCIUM 8.4* 9.0 8.0* 8.0*  --  8.4*  MG  --  2.1  --   --   --   --   PHOS  --  6.4*  --  7.2*  --  5.8*   GFR: Estimated Creatinine Clearance: 5.7 mL/min (by C-G formula based on Cr of 6.86). Liver Function Tests:  Recent Labs Lab 02/18/16 0330 02/21/16 1511 02/23/16 0803  AST 21  --   --   ALT 16  --   --   ALKPHOS 60  --   --   BILITOT 1.0  --   --   PROT 7.7  --   --   ALBUMIN 2.7* 2.1* 1.9*   No results for input(s): LIPASE, AMYLASE in the last 168 hours. No results for input(s): AMMONIA in the last 168 hours. Coagulation Profile: No results for input(s):  INR, PROTIME in the last 168 hours. Cardiac Enzymes: No results for input(s): CKTOTAL, CKMB, CKMBINDEX, TROPONINI in the last 168 hours. BNP (last 3 results) No results for input(s): PROBNP in the last 8760 hours. HbA1C: No results for input(s): HGBA1C in the last 72 hours. CBG: No results for input(s): GLUCAP in the last 168 hours. Lipid Profile: No results for input(s): CHOL, HDL, LDLCALC, TRIG, CHOLHDL, LDLDIRECT in the last 72 hours. Thyroid Function Tests: No results for input(s): TSH, T4TOTAL, FREET4, T3FREE, THYROIDAB in the last 72 hours. Anemia Panel: No results for input(s): VITAMINB12, FOLATE, FERRITIN, TIBC, IRON, RETICCTPCT in the last 72 hours.  Sepsis Labs: No results for input(s): PROCALCITON, LATICACIDVEN in the last 168 hours.  Recent Results (from the past 240 hour(s))  Urine culture     Status: Abnormal   Collection Time: 02/18/16  2:45 AM  Result Value Ref Range Status   Specimen Description URINE, CLEAN CATCH  Final   Special Requests NONE  Final   Culture (A)  Final    >=100,000 COLONIES/mL GROUP B STREP(S.AGALACTIAE)ISOLATED TESTING AGAINST S. AGALACTIAE NOT ROUTINELY PERFORMED DUE TO PREDICTABILITY OF AMP/PEN/VAN SUSCEPTIBILITY.    Report Status 02/19/2016 FINAL  Final  MRSA PCR Screening     Status: None   Collection Time: 02/18/16  6:29 PM  Result Value Ref Range Status   MRSA by PCR NEGATIVE NEGATIVE Final    Comment:        The GeneXpert MRSA Assay (FDA approved for NASAL specimens only), is one component of a comprehensive MRSA colonization surveillance program. It is not intended to diagnose MRSA infection nor to guide or monitor treatment for MRSA infections.   Culture, blood (routine x 2)     Status: Abnormal   Collection Time: 02/18/16  9:40 PM  Result Value Ref Range Status   Specimen Description BLOOD HEMODIALYSIS CATHETER  Final   Special Requests BOTTLES DRAWN AEROBIC AND ANAEROBIC 10CC  Final   Culture  Setup Time   Final     GRAM POSITIVE COCCI IN CHAINS IN BOTH AEROBIC AND ANAEROBIC BOTTLES CRITICAL RESULT CALLED TO, READ BACK BY AND VERIFIED WITH: C DAVIS,RN AT 1144 02/19/16 BY L BENFIELD    Culture (A)  Final    GROUP B STREP(S.AGALACTIAE)ISOLATED SUSCEPTIBILITIES PERFORMED ON PREVIOUS CULTURE WITHIN THE LAST 5 DAYS.    Report Status 02/22/2016 FINAL  Final  Culture, blood (routine  x 2)     Status: Abnormal   Collection Time: 02/18/16  9:50 PM  Result Value Ref Range Status   Specimen Description BLOOD HEMODIALYSIS CATHETER  Final   Special Requests BOTTLES DRAWN AEROBIC AND ANAEROBIC 10CC   Final   Culture  Setup Time   Final    GRAM POSITIVE COCCI IN CHAINS IN BOTH AEROBIC AND ANAEROBIC BOTTLES CRITICAL RESULT CALLED TO, READ BACK BY AND VERIFIED WITH: Beulah Gandy D AT 1102 02/19/16 BY L BENFIELD    Culture GROUP B STREP(S.AGALACTIAE)ISOLATED (A)  Final   Report Status 02/22/2016 FINAL  Final   Organism ID, Bacteria GROUP B STREP(S.AGALACTIAE)ISOLATED  Final      Susceptibility   Group b strep(s.agalactiae)isolated - MIC*    CLINDAMYCIN <=0.25 SENSITIVE Sensitive     AMPICILLIN <=0.25 SENSITIVE Sensitive     ERYTHROMYCIN <=0.12 SENSITIVE Sensitive     VANCOMYCIN 0.5 SENSITIVE Sensitive     CEFTRIAXONE <=0.12 SENSITIVE Sensitive     LEVOFLOXACIN 1 SENSITIVE Sensitive     * GROUP B STREP(S.AGALACTIAE)ISOLATED  Blood Culture ID Panel (Reflexed)     Status: Abnormal   Collection Time: 02/18/16  9:50 PM  Result Value Ref Range Status   Enterococcus species NOT DETECTED NOT DETECTED Final   Vancomycin resistance NOT DETECTED NOT DETECTED Final   Listeria monocytogenes NOT DETECTED NOT DETECTED Final   Staphylococcus species NOT DETECTED NOT DETECTED Final   Staphylococcus aureus NOT DETECTED NOT DETECTED Final   Methicillin resistance NOT DETECTED NOT DETECTED Final   Streptococcus species DETECTED (A) NOT DETECTED Final    Comment: CRITICAL RESULT CALLED TO, READ BACK BY AND VERIFIED WITH: Beulah Gandy D AT 1102 02/19/16 BY L BENFIELD    Streptococcus agalactiae DETECTED (A) NOT DETECTED Final    Comment: CRITICAL RESULT CALLED TO, READ BACK BY AND VERIFIED WITH: Beulah Gandy D AT 1102 02/19/16 BY L BENFIELD    Streptococcus pneumoniae NOT DETECTED NOT DETECTED Final   Streptococcus pyogenes NOT DETECTED NOT DETECTED Final   Acinetobacter baumannii NOT DETECTED NOT DETECTED Final   Enterobacteriaceae species NOT DETECTED NOT DETECTED Final   Enterobacter cloacae complex NOT DETECTED NOT DETECTED Final   Escherichia coli NOT DETECTED NOT DETECTED Final   Klebsiella oxytoca NOT DETECTED NOT DETECTED Final   Klebsiella pneumoniae NOT DETECTED NOT DETECTED Final   Proteus species NOT DETECTED NOT DETECTED Final   Serratia marcescens NOT DETECTED NOT DETECTED Final   Carbapenem resistance NOT DETECTED NOT DETECTED Final   Haemophilus influenzae NOT DETECTED NOT DETECTED Final   Neisseria meningitidis NOT DETECTED NOT DETECTED Final   Pseudomonas aeruginosa NOT DETECTED NOT DETECTED Final   Candida albicans NOT DETECTED NOT DETECTED Final   Candida glabrata NOT DETECTED NOT DETECTED Final   Candida krusei NOT DETECTED NOT DETECTED Final   Candida parapsilosis NOT DETECTED NOT DETECTED Final   Candida tropicalis NOT DETECTED NOT DETECTED Final         Radiology Studies: No results found.      Scheduled Meds: . amLODipine  5 mg Oral Daily  . calcium acetate  667 mg Oral TID WC  .  ceFAZolin (ANCEF) IV  2 g Intravenous Q T,Th,Sa-HD  . cinacalcet  60 mg Oral Q breakfast  . darbepoetin (ARANESP) injection - DIALYSIS  60 mcg Intravenous Q Thu-HD  . doxercalciferol  4 mcg Intravenous Q T,Th,Sa-HD  . famotidine  10 mg Oral Daily  . feeding supplement (PRO-STAT SUGAR FREE 64)  30 mL  Oral BID  . isosorbide mononitrate  60 mg Oral Daily  . lanthanum  2,000 mg Oral TID WC  . mirtazapine  15 mg Oral QHS  . multivitamin  1 tablet Oral QHS  . pantoprazole  40 mg Oral Daily    . sodium chloride flush  3 mL Intravenous Q12H  . sodium chloride flush  3 mL Intravenous Q12H  . valACYclovir  500 mg Oral Q48H   Continuous Infusions:     LOS: 5 days    Time spent: 30 minutes.    Hosie Poisson, MD [facility] Pager [phone] If 7PM-7AM, please contact night-coverage [email or web address] Password [facility] 02/23/2016, 3:58 PM

## 2016-02-23 NOTE — Progress Notes (Signed)
[facility] Progress Note   Subjective: Had fever again overnight- seen on HD this AM- is calm - seems much better MS wise    Filed Vitals:   02/23/16 0644 02/23/16 0700 02/23/16 0713 02/23/16 0730  BP:  111/78 109/76 109/70  Pulse:  83 77 84  Temp: 100 F (37.8 C) 98.7 F (37.1 C)    TempSrc: Oral Oral    Resp:  16 15 19   Height:      Weight:  53.8 kg (118 lb 9.7 oz)    SpO2:        Inpatient medications: . amLODipine  5 mg Oral Daily  . calcium acetate  667 mg Oral TID WC  .  ceFAZolin (ANCEF) IV  2 g Intravenous Q T,Th,Sat-1800  . cinacalcet  60 mg Oral Q breakfast  . doxercalciferol  4 mcg Intravenous Q T,Th,Sa-HD  . famotidine  10 mg Oral Daily  . feeding supplement (PRO-STAT SUGAR FREE 64)  30 mL Oral BID  . hydrALAZINE  10 mg Oral TID  . isosorbide mononitrate  60 mg Oral Daily  . lanthanum  2,000 mg Oral TID WC  . mirtazapine  15 mg Oral QHS  . multivitamin  1 tablet Oral QHS  . pantoprazole  40 mg Oral Daily  . sodium chloride flush  3 mL Intravenous Q12H  . sodium chloride flush  3 mL Intravenous Q12H  . valACYclovir  500 mg Oral Q48H     sodium chloride, acetaminophen **OR** acetaminophen, bisacodyl, hydrALAZINE, HYDROcodone-acetaminophen, ondansetron **OR** ondansetron (ZOFRAN) IV, prochlorperazine, simethicone, sodium chloride flush, zolpidem  Exam: Seems much better today MS wise  No jvd Chest clear bilat to bases RRR 2/6 sem Abd soft ntnd Ext no LE edema; both shoulder painful w palpation L> R Neuro limited movement L > R UE prob due to joint pain LUA AVF +bruit Alert, Ox 3  Dialysis: TTS SW  3h 81min  57kg  2/2.25 bath   Hep none  LUA AVF Aranesp 60 mcg IV weekly (last dose 02/16/16 HGB 11.1) Hectoral 4 mcg IV q treatment (Ca 7.7 C Ca 8.2 PTH 396 01/26/16 phos 5.7 02/09/16)   Assessment: 1. Neutropenia/ fevers -  Group b strep bacteremia - on ancef per pharmacy and primary -  Still having fevers ?? FYI we can give ancef at OP  dialysis  2. L sided weakness - felt to be due to pain/ MS origin. Neuro saw- rec getting MRI but she could not cooperate with exam or MRI - [name] non issue 3. HTN'sive urgency - resolved on amlodipine and hydralazine - actually now BPs low- suspect was not taking meds as should will try to stop hydralazine and do norvasc only 4. ESRD - TTS HD-  today  via AVF 5. Vol - try for EDW- better after HD 6. Anemia - HGB now 10.3-->9.6  Add back some ESA 7. Metabolic bone disease - Continue binders- fosrenol and phoslo, VDRA, sensipar- phos down to 5.8-  8. Nutrition - Albumin 2.7---> 1.9 . Renal diet/prostat/renal vit 9. Myeloma- recent chemoRx at [facility] 4-5 d ago- neutropenic- received stimulant - WBC climbing  10. MS- is not normal at baseline from what I had observed at kidney center in past- but seemed worse yest, better today-could acute illness/stressful situation be affecting?? Also maybe some valtrex affect- have decreased dose to every other day   [name]   02/23/2016, 8:57 AM    Recent Labs Lab 02/18/16 0330 02/19/16 0427 02/21/16 1511  02/22/16 1005 02/23/16 0803  NA 128* 131* 130*  --  128*  K 5.3* 4.4 5.5* 4.3 4.5  CL 91* 93* 92*  --  92*  CO2 26 26 23   --  24  GLUCOSE 112* 109* 76  --  100*  BUN 44* 20 70*  --  62*  CREATININE 7.25* 4.29* 8.59*  --  6.86*  CALCIUM 9.0 8.0* 8.0*  --  8.4*  PHOS 6.4*  --  7.2*  --  5.8*    Recent Labs Lab 02/18/16 0330 02/21/16 1511 02/23/16 0803  AST 21  --   --   ALT 16  --   --   ALKPHOS 60  --   --   BILITOT 1.0  --   --   PROT 7.7  --   --   ALBUMIN 2.7* 2.1* 1.9*    Recent Labs Lab 02/20/16 0431 02/21/16 0328 02/22/16 0400 02/23/16 0803  WBC 1.2* 2.1* 2.3* 3.1*  NEUTROABS 0.3* 0.9* 1.3*  --   HGB 11.2* 10.5* 10.3* 9.6*  HCT 34.0* 32.3* 32.7* 30.3*  MCV 106.3* 105.9* 109.4* 109.0*  PLT 160 184 200 222

## 2016-02-23 NOTE — Clinical Social Work Note (Signed)
Clinical Social Worker continuing to follow patient and family for support and discharge planning needs.  Patient is from [place] and plans to return to [place] once medically stable.  CSW remains available for support and to facilitate patient discharge needs.  Barbette Or, [place]

## 2016-02-23 NOTE — Progress Notes (Addendum)
Pt oral temp  103.0 and is feeling cold. Temp 93/57.  P.O. Tylenol administered, Dr. Karleen Hampshire notified.  Will continue to closely monitor.

## 2016-02-23 NOTE — Procedures (Signed)
Patient was seen on dialysis and the procedure was supervised.  BFR 400  Via AVF BP is  112/77.   Patient appears to be tolerating treatment well  [name] A 02/23/2016

## 2016-02-24 DIAGNOSIS — R4182 Altered mental status, unspecified: Secondary | ICD-10-CM

## 2016-02-24 LAB — CBC
HCT: 30.3 % — ABNORMAL LOW (ref 36.0–46.0)
Hemoglobin: 9.5 g/dL — ABNORMAL LOW (ref 12.0–15.0)
MCH: 34.5 pg — AB (ref 26.0–34.0)
MCHC: 31.4 g/dL (ref 30.0–36.0)
MCV: 110.2 fL — ABNORMAL HIGH (ref 78.0–100.0)
PLATELETS: 221 10*3/uL (ref 150–400)
RBC: 2.75 MIL/uL — AB (ref 3.87–5.11)
RDW: 14.5 % (ref 11.5–15.5)
WBC: 3.7 10*3/uL — ABNORMAL LOW (ref 4.0–10.5)

## 2016-02-24 LAB — RENAL FUNCTION PANEL
ALBUMIN: 1.9 g/dL — AB (ref 3.5–5.0)
Anion gap: 11 (ref 5–15)
BUN: 44 mg/dL — ABNORMAL HIGH (ref 6–20)
CALCIUM: 9.4 mg/dL (ref 8.9–10.3)
CO2: 26 mmol/L (ref 22–32)
CREATININE: 4.98 mg/dL — AB (ref 0.44–1.00)
Chloride: 91 mmol/L — ABNORMAL LOW (ref 101–111)
GFR, EST AFRICAN AMERICAN: 9 mL/min — AB (ref >60)
GFR, EST NON AFRICAN AMERICAN: 8 mL/min — AB (ref >60)
Glucose, Bld: 92 mg/dL (ref 65–99)
PHOSPHORUS: 6.3 mg/dL — AB (ref 2.5–4.6)
Potassium: 4.4 mmol/L (ref 3.5–5.1)
SODIUM: 128 mmol/L — AB (ref 135–145)

## 2016-02-24 NOTE — Progress Notes (Signed)
[facility] Progress Note   Subjective: Had fever again overnight- HD yest, removed 2000 tolerated well    Filed Vitals:   02/23/16 2001 02/23/16 2108 02/23/16 2206 02/24/16 0549  BP: 78/51 92/61 112/76 123/74  Pulse: 51 68 81 80  Temp:  99.9 F (37.7 C) 99.3 F (37.4 C) 99.7 F (37.6 C)  TempSrc:  Oral Oral Oral  Resp:  16  14  Height:      Weight:      SpO2:  97% 99% 99%    Inpatient medications: . amLODipine  5 mg Oral Daily  . calcium acetate  667 mg Oral TID WC  .  ceFAZolin (ANCEF) IV  2 g Intravenous Q T,Th,Sa-HD  . cinacalcet  60 mg Oral Q breakfast  . darbepoetin (ARANESP) injection - DIALYSIS  60 mcg Intravenous Q Thu-HD  . doxercalciferol  4 mcg Intravenous Q T,Th,Sa-HD  . famotidine  10 mg Oral Daily  . feeding supplement (PRO-STAT SUGAR FREE 64)  30 mL Oral BID  . isosorbide mononitrate  60 mg Oral Daily  . lanthanum  2,000 mg Oral TID WC  . mirtazapine  15 mg Oral QHS  . multivitamin  1 tablet Oral QHS  . pantoprazole  40 mg Oral Daily  . sodium chloride flush  3 mL Intravenous Q12H  . sodium chloride flush  3 mL Intravenous Q12H  . valACYclovir  500 mg Oral Q48H     sodium chloride, acetaminophen **OR** acetaminophen, bisacodyl, hydrALAZINE, HYDROcodone-acetaminophen, ondansetron **OR** ondansetron (ZOFRAN) IV, prochlorperazine, simethicone, sodium chloride flush, zolpidem  Exam: Seems much better today MS wise  No jvd Chest clear bilat to bases RRR 2/6 sem Abd soft ntnd Ext no LE edema; both shoulder painful w palpation L> R Neuro limited movement L > R UE prob due to joint pain LUA AVF +bruit Alert, Ox 3  Dialysis: TTS SW  3h 41min  57kg  2/2.25 bath   Hep none  LUA AVF Aranesp 60 mcg IV weekly (last dose 02/16/16 HGB 11.1) Hectoral 4 mcg IV q treatment (Ca 7.7 C Ca 8.2 PTH 396 01/26/16 phos 5.7 02/09/16)   Assessment: 1. Neutropenia/ fevers -  Group b strep bacteremia - on ancef per pharmacy and primary -  Still having  fevers ?? Most recent blood cultures negative-  FYI we can give ancef at OP dialysis  2. L sided weakness - felt to be due to pain/ MS origin. Neuro saw- rec getting MRI but she could not cooperate with exam or MRI - a non issue 3. HTN'sive urgency - resolved on amlodipine and hydralazine - actually now BPs low- suspect was not taking meds as should have stopped hydralazine- will stop norvasc as well 4. ESRD - TTS HD-  tomorrow  via AVF 5. Vol - try for EDW- better after HD 6. Anemia - HGB now 10.3-->9.6  Added back some ESA 7. Metabolic bone disease - Continue binders- fosrenol and phoslo, VDRA, sensipar- phos down to 5.8-  8. Nutrition - Albumin 2.7---> 1.9 . Renal diet/prostat/renal vit 9. Myeloma- recent chemoRx at [facility] 4-5 d ago- neutropenic- received stimulant - WBC climbing  10. MS- is not normal at baseline from what I had observed at kidney center in past- but seemed worse previously, better today-could acute illness/stressful situation be affecting?? Also maybe some valtrex affect- have decreased dose to every other day   Kaley Jutras A   02/24/2016, 8:34 AM    Recent Labs Lab 02/18/16 0330 02/19/16 0427 02/21/16  1511 02/22/16 1005 02/23/16 0803  NA 128* 131* 130*  --  128*  K 5.3* 4.4 5.5* 4.3 4.5  CL 91* 93* 92*  --  92*  CO2 26 26 23   --  24  GLUCOSE 112* 109* 76  --  100*  BUN 44* 20 70*  --  62*  CREATININE 7.25* 4.29* 8.59*  --  6.86*  CALCIUM 9.0 8.0* 8.0*  --  8.4*  PHOS 6.4*  --  7.2*  --  5.8*    Recent Labs Lab 02/18/16 0330 02/21/16 1511 02/23/16 0803  AST 21  --   --   ALT 16  --   --   ALKPHOS 60  --   --   BILITOT 1.0  --   --   PROT 7.7  --   --   ALBUMIN 2.7* 2.1* 1.9*    Recent Labs Lab 02/20/16 0431 02/21/16 0328 02/22/16 0400 02/23/16 0803  WBC 1.2* 2.1* 2.3* 3.1*  NEUTROABS 0.3* 0.9* 1.3*  --   HGB 11.2* 10.5* 10.3* 9.6*  HCT 34.0* 32.3* 32.7* 30.3*  MCV 106.3* 105.9* 109.4* 109.0*  PLT 160 184 200 222

## 2016-02-24 NOTE — Care Management Note (Signed)
Case Management Note  Patient Details  Name: [name] MRN: [id number] Date of Birth: 09/12/1941  Subjective/Objective: Pt admitted for ESRD increased fevers. Pt is from [place] and plan to return once stable.                   Action/Plan: CSW to assist with disposition needs. Per MD pt should be stable for d/c on Sat.   Expected Discharge Date:                  Expected Discharge Plan:  Skilled Nursing Facility  In-House Referral:  Clinical Social Work  Discharge planning Services  CM Consult  Post Acute Care Choice:  NA Choice offered to:  NA  DME Arranged:  N/A DME Agency:  NA  HH Arranged:  NA HH Agency:  NA  Status of Service:  Completed, signed off  Medicare Important Message Given:  Yes Date Medicare IM Given:    Medicare IM give by:    Date Additional Medicare IM Given:    Additional Medicare Important Message give by:     If discussed at [place] of Stay Meetings, dates discussed:    Additional Comments:  Bethena Roys, RN 02/24/2016, 3:04 PM

## 2016-02-24 NOTE — Consult Note (Signed)
[facility]  Total days of antibiotics 7        Day 4 cefazolin        Day 7 valacyclovir               Reason for Consult: fever, group b strep bacteremia in immunocompromised host  Referring Physician: Karleen Hampshire  Active Problems:   ESRD (end stage renal disease) ([place])   Multiple myeloma ([place])   Hypertension associatd with end stage renal disease on dialysis   Hypertensive urgency   Swelling of joint of left knee   Weakness    HPI: [name] is a 75 y.o. female with HTN, ESRD on HD T,Th,Sat. With relapsed and refractory IgG lambda MM currently on bortezomib, cyclophosphomide and dexamethasone (VCD) chemo. She was scheduled for chemo on 5/3 but pushed due to neutropenia ANC of 0.3. On 5/10 she was given VCD plus GCSF and continued on vatrex for VZV proph.She then received another cycle on 5/17 of VCD plus GCSF. She was admitted 3 days after receiving her chemo regimen with compliant of  left shoulder nad left knee pain after HD in addn to N/V/d and fever of 100.16F. Family reported behavior change at SNF, as noticed by nephrologist as well. She has been febrile during most of her admission. She initially was placed on cefepime  for her fevers. Infectious work up shows + blood cx drawn -> group  Strep bacteremia as well as isolated on urine culture. Repeat blood cx from 5/24 are NGTD. Still having fevers on 5/25 up to 103.f. She is tolerating HD.  Past Medical History  Diagnosis Date  . ESRD (end stage renal disease) ([place]) 03/11/2014  . Hypertension associatd with end stage renal disease on dialysis 03/11/2014  . GERD (gastroesophageal reflux disease) 04/23/2015  . Closed fracture of right distal femur ([place]) 09/01/2015  . Multiple myeloma ([place]) 03/11/2014  . Malnutrition of moderate degree 09/02/2015    Allergies: No Known Allergies   MEDICATIONS: . calcium acetate  667 mg Oral TID WC  .  ceFAZolin (ANCEF) IV  2 g Intravenous Q T,Th,Sa-HD  . cinacalcet  60 mg  Oral Q breakfast  . darbepoetin (ARANESP) injection - DIALYSIS  60 mcg Intravenous Q Thu-HD  . doxercalciferol  4 mcg Intravenous Q T,Th,Sa-HD  . famotidine  10 mg Oral Daily  . feeding supplement (PRO-STAT SUGAR FREE 64)  30 mL Oral BID  . isosorbide mononitrate  60 mg Oral Daily  . lanthanum  2,000 mg Oral TID WC  . mirtazapine  15 mg Oral QHS  . multivitamin  1 tablet Oral QHS  . pantoprazole  40 mg Oral Daily  . sodium chloride flush  3 mL Intravenous Q12H  . sodium chloride flush  3 mL Intravenous Q12H  . valACYclovir  500 mg Oral Q48H    Social History  Substance Use Topics  . Smoking status: Never Smoker   . Smokeless tobacco: Never Used  . Alcohol Use: No    Family History  Problem Relation Age of Onset  . Hypertension Mother   . Hypertension Father     Review of Systems -  She has atlered mental status and tangential to answering question. Unable to get full ros. She subscribes to left shoulder pain and left knee pain  OBJECTIVE: Temp:  [99.1 F (37.3 C)-103 F (39.4 C)] 99.1 F (37.3 C) (05/26 1719) Pulse Rate:  [51-90] 78 (05/26 1448) Resp:  [14-16] 14 (05/26 0549) BP: (  78-123)/(51-76) 122/60 mmHg (05/26 1448) SpO2:  [97 %-99 %] 99 % (05/26 1448) Physical Exam  Constitutional:  oriented to person, place.Marland Kitchen appears well-developed and well-nourished. No distress.  HENT: [place]/AT, PERRLA, no scleral icterus.edentulous Mouth/Throat: Oropharynx is clear and moist. No oropharyngeal exudate.  Cardiovascular: Normal rate, regular rhythm with +2 of 6 murmur BH at RUSB. Exam reveals no gallop and no friction rub.  Pulmonary/Chest: Effort normal and breath sounds normal. No respiratory distress.  has no wheezes.  Neck = supple, no nuchal rigidity Abdominal: Soft. Bowel sounds are normal.  exhibits no distension. There is no tenderness.  Lymphadenopathy: no cervical adenopathy. No axillary adenopathy Neurological: alert and oriented to person, place, and time.  Ext: left  arm AVfistula not warm, no erythema, no drainage. +thrill. Left knee +effusion. Left shoulder point tenderness Skin: Skin is warm and dry. No rash noted. No erythema.  Psychiatric: emotionally labile  LABS: Results for orders placed or performed during the hospital encounter of 02/17/16 (from the past 48 hour(s))  CBC     Status: Abnormal   Collection Time: 02/23/16  8:03 AM  Result Value Ref Range   WBC 3.1 (L) 4.0 - 10.5 K/uL   RBC 2.78 (L) 3.87 - 5.11 MIL/uL   Hemoglobin 9.6 (L) 12.0 - 15.0 g/dL   HCT 30.3 (L) 36.0 - 46.0 %   MCV 109.0 (H) 78.0 - 100.0 fL   MCH 34.5 (H) 26.0 - 34.0 pg   MCHC 31.7 30.0 - 36.0 g/dL   RDW 14.7 11.5 - 15.5 %   Platelets 222 150 - 400 K/uL  Renal function panel     Status: Abnormal   Collection Time: 02/23/16  8:03 AM  Result Value Ref Range   Sodium 128 (L) 135 - 145 mmol/L   Potassium 4.5 3.5 - 5.1 mmol/L   Chloride 92 (L) 101 - 111 mmol/L   CO2 24 22 - 32 mmol/L   Glucose, Bld 100 (H) 65 - 99 mg/dL   BUN 62 (H) 6 - 20 mg/dL   Creatinine, Ser 6.86 (H) 0.44 - 1.00 mg/dL   Calcium 8.4 (L) 8.9 - 10.3 mg/dL   Phosphorus 5.8 (H) 2.5 - 4.6 mg/dL   Albumin 1.9 (L) 3.5 - 5.0 g/dL   GFR calc non Af Amer 5 (L) >60 mL/min   GFR calc Af Amer 6 (L) >60 mL/min    Comment: (NOTE) The eGFR has been calculated using the CKD EPI equation. This calculation has not been validated in all clinical situations. eGFR's persistently <60 mL/min signify possible Chronic Kidney Disease.    Anion gap 12 5 - 15  Renal function panel     Status: Abnormal   Collection Time: 02/24/16 12:05 PM  Result Value Ref Range   Sodium 128 (L) 135 - 145 mmol/L   Potassium 4.4 3.5 - 5.1 mmol/L   Chloride 91 (L) 101 - 111 mmol/L   CO2 26 22 - 32 mmol/L   Glucose, Bld 92 65 - 99 mg/dL   BUN 44 (H) 6 - 20 mg/dL   Creatinine, Ser 4.98 (H) 0.44 - 1.00 mg/dL   Calcium 9.4 8.9 - 10.3 mg/dL   Phosphorus 6.3 (H) 2.5 - 4.6 mg/dL   Albumin 1.9 (L) 3.5 - 5.0 g/dL   GFR calc non Af Amer 8  (L) >60 mL/min   GFR calc Af Amer 9 (L) >60 mL/min    Comment: (NOTE) The eGFR has been calculated using the CKD EPI equation. This calculation has  not been validated in all clinical situations. eGFR's persistently <60 mL/min signify possible Chronic Kidney Disease.    Anion gap 11 5 - 15  CBC     Status: Abnormal   Collection Time: 02/24/16 12:06 PM  Result Value Ref Range   WBC 3.7 (L) 4.0 - 10.5 K/uL   RBC 2.75 (L) 3.87 - 5.11 MIL/uL   Hemoglobin 9.5 (L) 12.0 - 15.0 g/dL   HCT 30.3 (L) 36.0 - 46.0 %   MCV 110.2 (H) 78.0 - 100.0 fL   MCH 34.5 (H) 26.0 - 34.0 pg   MCHC 31.4 30.0 - 36.0 g/dL   RDW 14.5 11.5 - 15.5 %   Platelets 221 150 - 400 K/uL    MICRO: 5/20 blood cx x 2 - group b strep 5/20 urine cx -group b strep 5/24 blood cx NGTD IMAGING: No results found. cxr no signs of infiltrate   Assessment/Plan:  75yo F immunocompromised host who has refractory multiple myeloma receiving velcade, cyclophos, dexamethasone plus GCSF on 5/17 then admitted for fever, n/v/d, left shoulder and knee pain, as well as altered mental status found to have group b strep bacteremia/uti-suggestive of disseminated disease.  Group b strep bacteremia= can continue with cefazolin, for ease of administration. She has murmur on exam. Recommend TTE to see if has endocarditis. Consider getting arthrocentesis to see if she has septic arthritis as possible source control  ams = does not appear to have meningitis but she is not at her baseline per records. Consider getting LP, CSF to see if she is having abn that would mimic CNS infection. Given that she is an immunocompromised host, would worry about HSV encephalitis, would consider also sending for HHV6.   Left shoulder pain = on exam does not appear to have bursitis. Continue to monitor  Dr Johnnye Sima to follow up over the weekend.

## 2016-02-24 NOTE — Progress Notes (Signed)
PROGRESS NOTE  [name]  DJT:[id number] DOB: Sep 12, 1941  DOA: 02/17/2016 PCP: Benito Mccreedy, MD   Brief Narrative:  75 year old female with ESRD on TTS HD, multiple myeloma, HTN, GERD, anemia of chronic disease, malnutrition, presented to [facility] ED on 5/19 with complaints of left shoulder and knee pain which began after HD the day prior. Unable to clearly say if she was weak on that side. Also complained of nausea, vomiting and diarrhea. No acute issues as per OP dialysis staff. Neurology evaluated in ED and did not believe that she had a CVA. Markedly elevated blood pressure on arrival 171/151-briefly on Cardene drip which was weaned off when blood pressures improved. As per nephrology, history of behavioral issues and resident of SNF. X-rays of left shoulder and knee without fractures. Admitted for hypertensive urgency-resolved. Ruled in for group B strep bacteremia and UTI. At baseline seems to have some behavioral issues (daughter however denies) but fluctuating and somewhat worse in the hospital. Patient did not cooperate for MRI brain. Nephrology following.   Assessment & Plan:   Active Problems:   ESRD (end stage renal disease) ([place])   Multiple myeloma ([facility])   Hypertension associatd with end stage renal disease on dialysis   Hypertensive urgency   Swelling of joint of left knee   Weakness  Neutropenic fever/group B strep bacteremia and sepsis from UTI - On IV Ancef which is  given across HD. Duration to be determined prior to discharge. - Chest x-ray without acute findings. Bilateral knees and shoulder exam not consistent with acute infectious arthritis. Seem chronic changes. - Patient is immunocompromised from multiple myeloma and its treatment. - recurrent fever, repeat blood cultures ordered and they have been negative so far. Would continue with ancef. requested for ID consult for recurrent fevers.   Hypertensive urgency - Initial blood pressure 171/151. Treated briefly in  the ED with Cardene drip and initiated her home oral antihypertensive regimen. Blood pressures improved and weaned off Cardene drip. - Continue amlodipine, hydralazine, Imdur. - Blood pressures controlled  B/L shoulders/knee pain and ?? Weakness - CT head without acute findings. No obvious focal deficits.  - X-rays of left shoulder without acute finding. X-ray of left knee shows joint effusion, probable loose body, tricompartmental osteoarthritis. Uric acid: Normal. No acute arthritis findings on physical exam. - Denies history of fall. - ? All related to osteoarthritis versus skeletal metastases from multiple myeloma. - Neurology consultation appreciated:Marland Kitchen However asymmetrical weakness felt to be more related to pain than stroke. Patient did not cooperate with MRI despite Ativan on 5/21. - Discussed with [name], oncology on 5/21 who indicated that patient has refractory multiple myeloma and has very poor prognosis. She recommended steroids for bony pain (however not started due to infection in immunosuppressed patients) or oxycodone (as per RN, patient refuses). She recommended palliative care consultation- discussed with daughter on 5/22 and she does not want to think about palliative care at this time.   ESRD on TTS HD/mild hyperkalemia - Nephrology follow-up appreciated. Hyperkalemia resolved. Status post HD on 5/20.  Pancytopenia - Probably multifactorial related to multiple myeloma and ESRD.  - Anemia stable. Thrombocytopenia resolved. Received last chemotherapy and Granix on 5/17-WBC may get worse before getting better. Follow daily CBC with differential. Improving WBC count.  Relapsed and refractory multiple myeloma - No obvious fractures on x-rays of left shoulder and knee.  - Followed at [facility]. Reviewed last progress note from 02/01/16 in care everywhere: No signs or symptoms to suggest progression. Patient  determined to continue aggressive treatment. Chemotherapy was held  on that day due to [facility] of 0.3 and she has had issues with neutropenia and requires G-CSF support. She received bortezomib, granix, cyclophosphamide on 5/17 at [facility]. - Please see above regarding discussion with oncology  History of behavioral issues/confusion/altered mental status - Monitor. Current ongoing issues. - As per Dr. Moshe Cipro, Nephrology who is familiar with patient, on 5/22 patient seemed to be acting her normal but on 5/23 MS seemed worse. Etiology unclear. Her valtrex dose was reduced. Today she is alert , less confused.   Chronic hyponatremia - May be related to ESRD and multiple myeloma.decreased to 128 today. . Management per nephrology.   DVT prophylaxis: SCDs Code Status: Full Family Communication: Discussed with patient. Marland Kitchen Updated care and answered questions. Discussed about palliative care-she wishes to think about it before making a decision. Disposition Plan: DC to SNF when medically stable.   Consultants:   Nephrology  Neurology  Procedures:   HD  Antimicrobials:   Valacyclovir 5/20 >  Cefepime 5/21 >5/23  Ancef 5/23  Subjective: She is alert and appears oriented.   Objective:  Filed Vitals:   02/24/16 0549 02/24/16 1059 02/24/16 1211 02/24/16 1448  BP: 123/74  104/54 122/60  Pulse: 80  52 78  Temp: 99.7 F (37.6 C) 99.1 F (37.3 C)  100.1 F (37.8 C)  TempSrc: Oral Oral  Oral  Resp: 14     Height:      Weight:      SpO2: 99%   99%    Intake/Output Summary (Last 24 hours) at 02/24/16 1640 Last data filed at 02/24/16 0850  Gross per 24 hour  Intake    840 ml  Output      0 ml  Net    840 ml   Filed Weights   02/23/16 0430 02/23/16 0700 02/23/16 1113  Weight: 56.8 kg (125 lb 3.5 oz) 53.8 kg (118 lb 9.7 oz) 51.8 kg (114 lb 3.2 oz)    Examination:  General exam: Pleasant elderly female without any complaints.  Respiratory system: Clear to auscultation. Respiratory effort normal. Cardiovascular system: S1 & S2  heard, RRR. No JVD, murmurs, rubs, gallops or clicks. No pedal edema.  Gastrointestinal system: Abdomen is nondistended, soft and nontender. No organomegaly or masses felt. Normal bowel sounds heard. Central nervous system: Alert and oriented to place and person. No focal neurological deficits. Extremities: swelling of both knees, tender to touch. Skin: No rashes, lesions or ulcers Psychiatry: Judgement and insight appear poor. Mood & affect appropriate.     Data Reviewed: I have personally reviewed following labs and imaging studies  CBC:  Recent Labs Lab 02/17/16 2106  02/20/16 0431 02/21/16 0328 02/22/16 0400 02/23/16 0803 02/24/16 1206  WBC 2.2*  < > 1.2* 2.1* 2.3* 3.1* 3.7*  NEUTROABS 1.6*  --  0.3* 0.9* 1.3*  --   --   HGB 12.3  < > 11.2* 10.5* 10.3* 9.6* 9.5*  HCT 36.7  < > 34.0* 32.3* 32.7* 30.3* 30.3*  MCV 106.1*  < > 106.3* 105.9* 109.4* 109.0* 110.2*  PLT 164  < > 160 184 200 222 221  < > = values in this interval not displayed. Basic Metabolic Panel:  Recent Labs Lab 02/18/16 0330 02/19/16 0427 02/21/16 1511 02/22/16 1005 02/23/16 0803 02/24/16 1205  NA 128* 131* 130*  --  128* 128*  K 5.3* 4.4 5.5* 4.3 4.5 4.4  CL 91* 93* 92*  --  92*  91*  CO2 '26 26 23  ' --  24 26  GLUCOSE 112* 109* 76  --  100* 92  BUN 44* 20 70*  --  62* 44*  CREATININE 7.25* 4.29* 8.59*  --  6.86* 4.98*  CALCIUM 9.0 8.0* 8.0*  --  8.4* 9.4  MG 2.1  --   --   --   --   --   PHOS 6.4*  --  7.2*  --  5.8* 6.3*   GFR: Estimated Creatinine Clearance: 7.9 mL/min (by C-G formula based on Cr of 4.98). Liver Function Tests:  Recent Labs Lab 02/18/16 0330 02/21/16 1511 02/23/16 0803 02/24/16 1205  AST 21  --   --   --   ALT 16  --   --   --   ALKPHOS 60  --   --   --   BILITOT 1.0  --   --   --   PROT 7.7  --   --   --   ALBUMIN 2.7* 2.1* 1.9* 1.9*   No results for input(s): LIPASE, AMYLASE in the last 168 hours. No results for input(s): AMMONIA in the last 168  hours. Coagulation Profile: No results for input(s): INR, PROTIME in the last 168 hours. Cardiac Enzymes: No results for input(s): CKTOTAL, CKMB, CKMBINDEX, TROPONINI in the last 168 hours. BNP (last 3 results) No results for input(s): PROBNP in the last 8760 hours. HbA1C: No results for input(s): HGBA1C in the last 72 hours. CBG: No results for input(s): GLUCAP in the last 168 hours. Lipid Profile: No results for input(s): CHOL, HDL, LDLCALC, TRIG, CHOLHDL, LDLDIRECT in the last 72 hours. Thyroid Function Tests: No results for input(s): TSH, T4TOTAL, FREET4, T3FREE, THYROIDAB in the last 72 hours. Anemia Panel: No results for input(s): VITAMINB12, FOLATE, FERRITIN, TIBC, IRON, RETICCTPCT in the last 72 hours.  Sepsis Labs: No results for input(s): PROCALCITON, LATICACIDVEN in the last 168 hours.  Recent Results (from the past 240 hour(s))  Urine culture     Status: Abnormal   Collection Time: 02/18/16  2:45 AM  Result Value Ref Range Status   Specimen Description URINE, CLEAN CATCH  Final   Special Requests NONE  Final   Culture (A)  Final    >=100,000 COLONIES/mL GROUP B STREP(S.AGALACTIAE)ISOLATED TESTING AGAINST S. AGALACTIAE NOT ROUTINELY PERFORMED DUE TO PREDICTABILITY OF AMP/PEN/VAN SUSCEPTIBILITY.    Report Status 02/19/2016 FINAL  Final  MRSA PCR Screening     Status: None   Collection Time: 02/18/16  6:29 PM  Result Value Ref Range Status   MRSA by PCR NEGATIVE NEGATIVE Final    Comment:        The GeneXpert MRSA Assay (FDA approved for NASAL specimens only), is one component of a comprehensive MRSA colonization surveillance program. It is not intended to diagnose MRSA infection nor to guide or monitor treatment for MRSA infections.   Culture, blood (routine x 2)     Status: Abnormal   Collection Time: 02/18/16  9:40 PM  Result Value Ref Range Status   Specimen Description BLOOD HEMODIALYSIS CATHETER  Final   Special Requests BOTTLES DRAWN AEROBIC AND  ANAEROBIC 10CC  Final   Culture  Setup Time   Final    GRAM POSITIVE COCCI IN CHAINS IN BOTH AEROBIC AND ANAEROBIC BOTTLES CRITICAL RESULT CALLED TO, READ BACK BY AND VERIFIED WITH: C DAVIS,RN AT 1144 02/19/16 BY L BENFIELD    Culture (A)  Final    GROUP B STREP(S.AGALACTIAE)ISOLATED SUSCEPTIBILITIES PERFORMED ON  PREVIOUS CULTURE WITHIN THE LAST 5 DAYS.    Report Status 02/22/2016 FINAL  Final  Culture, blood (routine x 2)     Status: Abnormal   Collection Time: 02/18/16  9:50 PM  Result Value Ref Range Status   Specimen Description BLOOD HEMODIALYSIS CATHETER  Final   Special Requests BOTTLES DRAWN AEROBIC AND ANAEROBIC 10CC   Final   Culture  Setup Time   Final    GRAM POSITIVE COCCI IN CHAINS IN BOTH AEROBIC AND ANAEROBIC BOTTLES CRITICAL RESULT CALLED TO, READ BACK BY AND VERIFIED WITH: Beulah Gandy D AT 1102 02/19/16 BY L BENFIELD    Culture GROUP B STREP(S.AGALACTIAE)ISOLATED (A)  Final   Report Status 02/22/2016 FINAL  Final   Organism ID, Bacteria GROUP B STREP(S.AGALACTIAE)ISOLATED  Final      Susceptibility   Group b strep(s.agalactiae)isolated - MIC*    CLINDAMYCIN <=0.25 SENSITIVE Sensitive     AMPICILLIN <=0.25 SENSITIVE Sensitive     ERYTHROMYCIN <=0.12 SENSITIVE Sensitive     VANCOMYCIN 0.5 SENSITIVE Sensitive     CEFTRIAXONE <=0.12 SENSITIVE Sensitive     LEVOFLOXACIN 1 SENSITIVE Sensitive     * GROUP B STREP(S.AGALACTIAE)ISOLATED  Blood Culture ID Panel (Reflexed)     Status: Abnormal   Collection Time: 02/18/16  9:50 PM  Result Value Ref Range Status   Enterococcus species NOT DETECTED NOT DETECTED Final   Vancomycin resistance NOT DETECTED NOT DETECTED Final   Listeria monocytogenes NOT DETECTED NOT DETECTED Final   Staphylococcus species NOT DETECTED NOT DETECTED Final   Staphylococcus aureus NOT DETECTED NOT DETECTED Final   Methicillin resistance NOT DETECTED NOT DETECTED Final   Streptococcus species DETECTED (A) NOT DETECTED Final    Comment: CRITICAL  RESULT CALLED TO, READ BACK BY AND VERIFIED WITH: Beulah Gandy D AT 1102 02/19/16 BY L BENFIELD    Streptococcus agalactiae DETECTED (A) NOT DETECTED Final    Comment: CRITICAL RESULT CALLED TO, READ BACK BY AND VERIFIED WITH: Beulah Gandy D AT 1102 02/19/16 BY L BENFIELD    Streptococcus pneumoniae NOT DETECTED NOT DETECTED Final   Streptococcus pyogenes NOT DETECTED NOT DETECTED Final   Acinetobacter baumannii NOT DETECTED NOT DETECTED Final   Enterobacteriaceae species NOT DETECTED NOT DETECTED Final   Enterobacter cloacae complex NOT DETECTED NOT DETECTED Final   Escherichia coli NOT DETECTED NOT DETECTED Final   Klebsiella oxytoca NOT DETECTED NOT DETECTED Final   Klebsiella pneumoniae NOT DETECTED NOT DETECTED Final   Proteus species NOT DETECTED NOT DETECTED Final   Serratia marcescens NOT DETECTED NOT DETECTED Final   Carbapenem resistance NOT DETECTED NOT DETECTED Final   Haemophilus influenzae NOT DETECTED NOT DETECTED Final   Neisseria meningitidis NOT DETECTED NOT DETECTED Final   Pseudomonas aeruginosa NOT DETECTED NOT DETECTED Final   Candida albicans NOT DETECTED NOT DETECTED Final   Candida glabrata NOT DETECTED NOT DETECTED Final   Candida krusei NOT DETECTED NOT DETECTED Final   Candida parapsilosis NOT DETECTED NOT DETECTED Final   Candida tropicalis NOT DETECTED NOT DETECTED Final  Culture, blood (Routine X 2) w Reflex to ID Panel     Status: None (Preliminary result)   Collection Time: 02/22/16 11:15 AM  Result Value Ref Range Status   Specimen Description BLOOD RIGHT ARM  Final   Special Requests IN PEDIATRIC BOTTLE 3CC  Final   Culture NO GROWTH 2 DAYS  Final   Report Status PENDING  Incomplete  Culture, blood (Routine X 2) w Reflex to ID Panel  Status: None (Preliminary result)   Collection Time: 02/22/16 11:23 AM  Result Value Ref Range Status   Specimen Description BLOOD RIGHT HAND  Final   Special Requests BOTTLES DRAWN AEROBIC ONLY [place]  Final    Culture NO GROWTH 2 DAYS  Final   Report Status PENDING  Incomplete         Radiology Studies: No results found.      Scheduled Meds: . calcium acetate  667 mg Oral TID WC  .  ceFAZolin (ANCEF) IV  2 g Intravenous Q T,Th,Sa-HD  . cinacalcet  60 mg Oral Q breakfast  . darbepoetin (ARANESP) injection - DIALYSIS  60 mcg Intravenous Q Thu-HD  . doxercalciferol  4 mcg Intravenous Q T,Th,Sa-HD  . famotidine  10 mg Oral Daily  . feeding supplement (PRO-STAT SUGAR FREE 64)  30 mL Oral BID  . isosorbide mononitrate  60 mg Oral Daily  . lanthanum  2,000 mg Oral TID WC  . mirtazapine  15 mg Oral QHS  . multivitamin  1 tablet Oral QHS  . pantoprazole  40 mg Oral Daily  . sodium chloride flush  3 mL Intravenous Q12H  . sodium chloride flush  3 mL Intravenous Q12H  . valACYclovir  500 mg Oral Q48H   Continuous Infusions:     LOS: 6 days    Time spent: 30 minutes.    Hosie Poisson, MD [facility] Pager [phone] If 7PM-7AM, please contact night-coverage [email or web address] Password [email or web address] 02/24/2016, 4:40 PM

## 2016-02-24 NOTE — Progress Notes (Signed)
Physical Therapy Treatment Patient Details Name: [name] MRN: [id number] DOB: 22-Dec-1940 Today's Date: 02/24/2016    History of Present Illness Pt is a 75 y/o F who presented to [facility] on 5/19 w/ c/o Lt shoulder and knee pain which began after HD the day prior.  Neurology evaluated in ED and did not believe that she had a CVA.  As per nephrology, history of behavioral issues and resident of SNF. X-rays of left shoulder and knee without fractures. Admitted for hypertensive urgency.  Pt's PMH includes ESRD, closed Rt distal femur fx, multiple myeloma, malnutrition.    PT Comments    Patient tolerated therapy session well despite pain in LEs. Assisted patient to EOB and perform LEs there-ex. Patient tolerated some WBing through BLEs to semi stand and scoot to [facility]. At this time continue with current POC. Will see as indicated and progress as tolerated.   Follow Up Recommendations  SNF;Supervision/Assistance - 24 hour     Equipment Recommendations  None recommended by PT    Recommendations for Other Services       Precautions / Restrictions Precautions Precautions: Fall Restrictions Weight Bearing Restrictions: No    Mobility  Bed Mobility Overal bed mobility: Needs Assistance Bed Mobility: Rolling;Sidelying to Sit;Sit to Supine Rolling: Mod assist;+2 for physical assistance Sidelying to sit: Mod assist;+2 for physical assistance   Sit to supine: Mod assist;+2 for physical assistance   General bed mobility comments: increased time, and assist to come to EOb secondary to anxiety and pain in [place]  Transfers Overall transfer level: Needs assistance Equipment used: 2 person hand held assist Transfers: Sit to/from Stand Sit to Stand: Mod assist         General transfer comment: performed in conjunction with scooting to [facility], patient able to bear some weight through LEs but had increased pain in LLE during activity performed partial stand x3  Ambulation/Gait              General Gait Details: not tested   Stairs            Wheelchair Mobility    Modified Rankin (Stroke Patients Only)       Balance Overall balance assessment: Needs assistance   Sitting balance-Leahy Scale: Fair Sitting balance - Comments: able to perform LE ther ex at EOB                            Cognition Arousal/Alertness: Awake/alert Behavior During Therapy: Anxious Overall Cognitive Status: No family/caregiver present to determine baseline cognitive functioning                      Exercises General Exercises - Lower Extremity Ankle Circles/Pumps: AROM;Both;10 reps;Supine Long Arc Quad: AROM;Both;10 reps Hip Flexion/Marching: AROM;Both;10 reps    General Comments        Pertinent Vitals/Pain Pain Assessment: Faces Faces Pain Scale: Hurts even more Pain Location: bilateral LEs left knee >R Pain Descriptors / Indicators: Grimacing;Guarding;Moaning Pain Intervention(s): Monitored during session    Home Living                      Prior Function            PT Goals (current goals can now be found in the care plan section) Acute Rehab PT Goals Patient Stated Goal: to feel better PT Goal Formulation: With patient Time For Goal Achievement: 03/04/16 Potential to Achieve Goals: Fair  Progress towards PT goals: Progressing toward goals    Frequency  Min 2X/week    PT Plan Current plan remains appropriate    Co-evaluation             End of Session Equipment Utilized During Treatment: Gait belt Activity Tolerance: Patient limited by pain;Patient limited by fatigue Patient left: in bed;with call bell/phone within reach;with bed alarm set     Time: 1310-1330 PT Time Calculation (min) (ACUTE ONLY): 20 min  Charges:  $Therapeutic Activity: 8-22 mins                    G CodesDuncan Dull 03/01/16, 4:19 PM [name], [place] DPT  [phone]

## 2016-02-25 ENCOUNTER — Inpatient Hospital Stay (HOSPITAL_COMMUNITY): Payer: Medicare Other

## 2016-02-25 DIAGNOSIS — R7881 Bacteremia: Secondary | ICD-10-CM

## 2016-02-25 DIAGNOSIS — N186 End stage renal disease: Secondary | ICD-10-CM

## 2016-02-25 DIAGNOSIS — M25512 Pain in left shoulder: Secondary | ICD-10-CM

## 2016-02-25 DIAGNOSIS — C9000 Multiple myeloma not having achieved remission: Secondary | ICD-10-CM

## 2016-02-25 DIAGNOSIS — B951 Streptococcus, group B, as the cause of diseases classified elsewhere: Secondary | ICD-10-CM

## 2016-02-25 DIAGNOSIS — R509 Fever, unspecified: Secondary | ICD-10-CM

## 2016-02-25 DIAGNOSIS — M25562 Pain in left knee: Secondary | ICD-10-CM

## 2016-02-25 LAB — RENAL FUNCTION PANEL
Albumin: 1.9 g/dL — ABNORMAL LOW (ref 3.5–5.0)
Anion gap: 12 (ref 5–15)
BUN: 66 mg/dL — AB (ref 6–20)
CHLORIDE: 89 mmol/L — AB (ref 101–111)
CO2: 24 mmol/L (ref 22–32)
Calcium: 8.9 mg/dL (ref 8.9–10.3)
Creatinine, Ser: 6.38 mg/dL — ABNORMAL HIGH (ref 0.44–1.00)
GFR calc Af Amer: 7 mL/min — ABNORMAL LOW (ref >60)
GFR, EST NON AFRICAN AMERICAN: 6 mL/min — AB (ref >60)
GLUCOSE: 98 mg/dL (ref 65–99)
POTASSIUM: 4.6 mmol/L (ref 3.5–5.1)
Phosphorus: 6.6 mg/dL — ABNORMAL HIGH (ref 2.5–4.6)
Sodium: 125 mmol/L — ABNORMAL LOW (ref 135–145)

## 2016-02-25 LAB — CBC
HEMATOCRIT: 28.1 % — AB (ref 36.0–46.0)
Hemoglobin: 9 g/dL — ABNORMAL LOW (ref 12.0–15.0)
MCH: 34.9 pg — AB (ref 26.0–34.0)
MCHC: 32 g/dL (ref 30.0–36.0)
MCV: 108.9 fL — AB (ref 78.0–100.0)
Platelets: 222 10*3/uL (ref 150–400)
RBC: 2.58 MIL/uL — ABNORMAL LOW (ref 3.87–5.11)
RDW: 14.5 % (ref 11.5–15.5)
WBC: 4.5 10*3/uL (ref 4.0–10.5)

## 2016-02-25 LAB — ECHOCARDIOGRAM COMPLETE
Height: 67 in
WEIGHTICAEL: 1940.05 [oz_av]

## 2016-02-25 LAB — HEPATITIS B SURFACE ANTIGEN: HEP B S AG: NEGATIVE

## 2016-02-25 MED ORDER — SODIUM CHLORIDE 0.9 % IV BOLUS (SEPSIS)
250.0000 mL | Freq: Once | INTRAVENOUS | Status: AC
  Administered 2016-02-25: 250 mL via INTRAVENOUS

## 2016-02-25 MED ORDER — ISOSORBIDE MONONITRATE ER 60 MG PO TB24
60.0000 mg | ORAL_TABLET | Freq: Every day | ORAL | Status: DC
  Administered 2016-02-27 – 2016-03-01 (×4): 60 mg via ORAL
  Filled 2016-02-25 (×5): qty 1

## 2016-02-25 MED ORDER — HEPARIN SODIUM (PORCINE) 1000 UNIT/ML DIALYSIS
20.0000 [IU]/kg | INTRAMUSCULAR | Status: DC | PRN
Start: 2016-02-25 — End: 2016-02-25

## 2016-02-25 MED ORDER — NEPRO/CARBSTEADY PO LIQD
237.0000 mL | Freq: Two times a day (BID) | ORAL | Status: DC
  Administered 2016-02-25 – 2016-02-28 (×6): 237 mL via ORAL
  Filled 2016-02-25 (×14): qty 237

## 2016-02-25 MED ORDER — DOXERCALCIFEROL 4 MCG/2ML IV SOLN: 2.0000 ug | INTRAVENOUS | Status: DC

## 2016-02-25 MED ORDER — DOXERCALCIFEROL 4 MCG/2ML IV SOLN
3.0000 ug | INTRAVENOUS | Status: DC
  Administered 2016-02-28 – 2016-03-01 (×2): 3 ug via INTRAVENOUS
  Filled 2016-02-25 (×2): qty 2

## 2016-02-25 NOTE — Progress Notes (Signed)
Called to room by patient. Pt states, "I feel like I am going to pass out." This RN checked pt's vitals. BP low, 84/42. MD paged.   After assessment and vitals, pt then states, "I feel fine, I just said that so you would come move my leg faster." BP still low, awaiting new orders. Will continue to monitor closely.

## 2016-02-25 NOTE — Progress Notes (Signed)
 INFECTIOUS DISEASE PROGRESS NOTE  ID: [name] is a 75 y.o. female with  Active Problems:   ESRD (end stage renal disease) (HCC)   Multiple myeloma (HCC)   Hypertension associatd with end stage renal disease on dialysis   Hypertensive urgency   Swelling of joint of left knee   Weakness  Subjective: C/o shoulder pain.   Abtx:  Anti-infectives    Start     Dose/Rate Route Frequency Ordered Stop   02/23/16 1800  ceFAZolin (ANCEF) IVPB 2g/100 mL premix  Status:  Discontinued     2 g 200 mL/hr over 30 Minutes Intravenous Every T-Th-Sa (1800) 02/23/16 0847 02/23/16 1055   02/23/16 1200  ceFAZolin (ANCEF) IVPB 2g/100 mL premix     2 g 200 mL/hr over 30 Minutes Intravenous Every T-Th-Sa (Hemodialysis) 02/23/16 1055     02/22/16 1000  valACYclovir (VALTREX) tablet 500 mg     500 mg Oral Every 48 hours 02/21/16 0904     02/21/16 1200  ceFAZolin (ANCEF) IVPB 2g/100 mL premix  Status:  Discontinued     2 g 200 mL/hr over 30 Minutes Intravenous Every T-Th-Sa (Hemodialysis) 02/19/16 1122 02/23/16 0847   02/19/16 0800  ceFEPIme (MAXIPIME) 2 g in dextrose 5 % 50 mL IVPB  Status:  Discontinued     2 g 100 mL/hr over 30 Minutes Intravenous Every T-Th-Sa (1800) 02/19/16 0742 02/19/16 1122   02/18/16 1000  valACYclovir (VALTREX) tablet 500 mg  Status:  Discontinued     500 mg Oral Daily 02/18/16 0229 02/21/16 0904      Medications:  Scheduled: . calcium acetate  667 mg Oral TID WC  .  ceFAZolin (ANCEF) IV  2 g Intravenous Q T,Th,Sa-HD  . cinacalcet  60 mg Oral Q breakfast  . darbepoetin (ARANESP) injection - DIALYSIS  60 mcg Intravenous Q Thu-HD  . [START ON 02/28/2016] doxercalciferol  3 mcg Intravenous Q T,Th,Sa-HD  . famotidine  10 mg Oral Daily  . feeding supplement (NEPRO CARB STEADY)  237 mL Oral BID BM  . feeding supplement (PRO-STAT SUGAR FREE 64)  30 mL Oral BID  . [START ON 02/26/2016] isosorbide mononitrate  60 mg Oral Daily  . lanthanum  2,000 mg Oral TID WC  .  mirtazapine  15 mg Oral QHS  . multivitamin  1 tablet Oral QHS  . pantoprazole  40 mg Oral Daily  . sodium chloride flush  3 mL Intravenous Q12H  . sodium chloride flush  3 mL Intravenous Q12H  . valACYclovir  500 mg Oral Q48H    Objective: Vital signs in last 24 hours: Temp:  [98.6 F (37 C)-100 F (37.8 C)] 99.7 F (37.6 C) (05/27 1240) Pulse Rate:  [72-100] 82 (05/27 1240) Resp:  [16-19] 18 (05/27 1240) BP: (93-121)/(53-77) 100/53 mmHg (05/27 1240) SpO2:  [95 %-98 %] 97 % (05/27 1240) Weight:  [51.619 kg (113 lb 12.8 oz)-55 kg (121 lb 4.1 oz)] 55 kg (121 lb 4.1 oz) (05/27 1138)   General appearance: alert, cooperative and no distress Resp: clear to auscultation bilaterally Cardio: regular rate and rhythm and systolic murmur: early systolic 3/6, crescendo at 2nd right intercostal space GI: normal findings: bowel sounds normal and soft, non-tender Extremities: both shoulders warm, slight swelling, tenderness. L knee swollen, warm.  LUE AVF + bruit.   Lab Results  Recent Labs  02/24/16 1205 02/24/16 1206 02/25/16 0741  WBC  --  3.7* 4.5  HGB  --  9.5* 9.0*  HCT  --    30.3* 28.1*  NA 128*  --  125*  K 4.4  --  4.6  CL 91*  --  89*  CO2 26  --  24  BUN 44*  --  66*  CREATININE 4.98*  --  6.38*   Liver Panel  Recent Labs  02/24/16 1205 02/25/16 0741  ALBUMIN 1.9* 1.9*   Sedimentation Rate No results for input(s): ESRSEDRATE in the last 72 hours. C-Reactive Protein No results for input(s): CRP in the last 72 hours.  Microbiology: Recent Results (from the past 240 hour(s))  Urine culture     Status: Abnormal   Collection Time: 02/18/16  2:45 AM  Result Value Ref Range Status   Specimen Description URINE, CLEAN CATCH  Final   Special Requests NONE  Final   Culture (A)  Final    >=100,000 COLONIES/mL GROUP B STREP(S.AGALACTIAE)ISOLATED TESTING AGAINST S. AGALACTIAE NOT ROUTINELY PERFORMED DUE TO PREDICTABILITY OF AMP/PEN/VAN SUSCEPTIBILITY.    Report Status  02/19/2016 FINAL  Final  MRSA PCR Screening     Status: None   Collection Time: 02/18/16  6:29 PM  Result Value Ref Range Status   MRSA by PCR NEGATIVE NEGATIVE Final    Comment:        The GeneXpert MRSA Assay (FDA approved for NASAL specimens only), is one component of a comprehensive MRSA colonization surveillance program. It is not intended to diagnose MRSA infection nor to guide or monitor treatment for MRSA infections.   Culture, blood (routine x 2)     Status: Abnormal   Collection Time: 02/18/16  9:40 PM  Result Value Ref Range Status   Specimen Description BLOOD HEMODIALYSIS CATHETER  Final   Special Requests BOTTLES DRAWN AEROBIC AND ANAEROBIC 10CC  Final   Culture  Setup Time   Final    GRAM POSITIVE COCCI IN CHAINS IN BOTH AEROBIC AND ANAEROBIC BOTTLES CRITICAL RESULT CALLED TO, READ BACK BY AND VERIFIED WITH: C DAVIS,RN AT 1144 02/19/16 BY L BENFIELD    Culture (A)  Final    GROUP B STREP(S.AGALACTIAE)ISOLATED SUSCEPTIBILITIES PERFORMED ON PREVIOUS CULTURE WITHIN THE LAST 5 DAYS.    Report Status 02/22/2016 FINAL  Final  Culture, blood (routine x 2)     Status: Abnormal   Collection Time: 02/18/16  9:50 PM  Result Value Ref Range Status   Specimen Description BLOOD HEMODIALYSIS CATHETER  Final   Special Requests BOTTLES DRAWN AEROBIC AND ANAEROBIC 10CC   Final   Culture  Setup Time   Final    GRAM POSITIVE COCCI IN CHAINS IN BOTH AEROBIC AND ANAEROBIC BOTTLES CRITICAL RESULT CALLED TO, READ BACK BY AND VERIFIED WITH: Beulah Gandy D AT 1102 02/19/16 BY L BENFIELD    Culture GROUP B STREP(S.AGALACTIAE)ISOLATED (A)  Final   Report Status 02/22/2016 FINAL  Final   Organism ID, Bacteria GROUP B STREP(S.AGALACTIAE)ISOLATED  Final      Susceptibility   Group b strep(s.agalactiae)isolated - MIC*    CLINDAMYCIN <=0.25 SENSITIVE Sensitive     AMPICILLIN <=0.25 SENSITIVE Sensitive     ERYTHROMYCIN <=0.12 SENSITIVE Sensitive     VANCOMYCIN 0.5 SENSITIVE Sensitive      CEFTRIAXONE <=0.12 SENSITIVE Sensitive     LEVOFLOXACIN 1 SENSITIVE Sensitive     * GROUP B STREP(S.AGALACTIAE)ISOLATED  Blood Culture ID Panel (Reflexed)     Status: Abnormal   Collection Time: 02/18/16  9:50 PM  Result Value Ref Range Status   Enterococcus species NOT DETECTED NOT DETECTED Final   Vancomycin resistance NOT DETECTED  NOT DETECTED Final   Listeria monocytogenes NOT DETECTED NOT DETECTED Final   Staphylococcus species NOT DETECTED NOT DETECTED Final   Staphylococcus aureus NOT DETECTED NOT DETECTED Final   Methicillin resistance NOT DETECTED NOT DETECTED Final   Streptococcus species DETECTED (A) NOT DETECTED Final    Comment: CRITICAL RESULT CALLED TO, READ BACK BY AND VERIFIED WITH: Beulah Gandy D AT 1102 02/19/16 BY L BENFIELD    Streptococcus agalactiae DETECTED (A) NOT DETECTED Final    Comment: CRITICAL RESULT CALLED TO, READ BACK BY AND VERIFIED WITH: Beulah Gandy D AT 1102 02/19/16 BY L BENFIELD    Streptococcus pneumoniae NOT DETECTED NOT DETECTED Final   Streptococcus pyogenes NOT DETECTED NOT DETECTED Final   Acinetobacter baumannii NOT DETECTED NOT DETECTED Final   Enterobacteriaceae species NOT DETECTED NOT DETECTED Final   Enterobacter cloacae complex NOT DETECTED NOT DETECTED Final   Escherichia coli NOT DETECTED NOT DETECTED Final   Klebsiella oxytoca NOT DETECTED NOT DETECTED Final   Klebsiella pneumoniae NOT DETECTED NOT DETECTED Final   Proteus species NOT DETECTED NOT DETECTED Final   Serratia marcescens NOT DETECTED NOT DETECTED Final   Carbapenem resistance NOT DETECTED NOT DETECTED Final   Haemophilus influenzae NOT DETECTED NOT DETECTED Final   Neisseria meningitidis NOT DETECTED NOT DETECTED Final   Pseudomonas aeruginosa NOT DETECTED NOT DETECTED Final   Candida albicans NOT DETECTED NOT DETECTED Final   Candida glabrata NOT DETECTED NOT DETECTED Final   Candida krusei NOT DETECTED NOT DETECTED Final   Candida parapsilosis NOT DETECTED NOT  DETECTED Final   Candida tropicalis NOT DETECTED NOT DETECTED Final  Culture, blood (Routine X 2) w Reflex to ID Panel     Status: None (Preliminary result)   Collection Time: 02/22/16 11:15 AM  Result Value Ref Range Status   Specimen Description BLOOD RIGHT ARM  Final   Special Requests IN PEDIATRIC BOTTLE 3CC  Final   Culture NO GROWTH 3 DAYS  Final   Report Status PENDING  Incomplete  Culture, blood (Routine X 2) w Reflex to ID Panel     Status: None (Preliminary result)   Collection Time: 02/22/16 11:23 AM  Result Value Ref Range Status   Specimen Description BLOOD RIGHT HAND  Final   Special Requests BOTTLES DRAWN AEROBIC ONLY [place]  Final   Culture NO GROWTH 3 DAYS  Final   Report Status PENDING  Incomplete    Studies/Results: No results found.   Assessment/Plan: Group B strep bacteremia L shoulder and knee pain   consider TEE  Consider aspirate of L knee to eval for septic joint  She will need long term atbx (can be given at HD)  Multiple Myeloma  Total days of antibiotics: 6 (ancef)         Bobby Rumpf Infectious Diseases (pager) [phone] [email or web address] 02/25/2016, 3:36 PM  LOS: 7 days

## 2016-02-25 NOTE — Progress Notes (Signed)
PROGRESS NOTE  Sindia Kowalczyk  DGU:[id number] DOB: 11-14-1940  DOA: 02/17/2016 PCP: Benito Mccreedy, MD   Brief Narrative:  74 year old female with ESRD on TTS HD, multiple myeloma, HTN, GERD, anemia of chronic disease, malnutrition, presented to [facility] ED on 5/19 with complaints of left shoulder and knee pain which began after HD the day prior. Unable to clearly say if she was weak on that side. Also complained of nausea, vomiting and diarrhea. No acute issues as per OP dialysis staff. Neurology evaluated in ED and did not believe that she had a CVA. Markedly elevated blood pressure on arrival 171/151-briefly on Cardene drip which was weaned off when blood pressures improved. As per nephrology, history of behavioral issues and resident of SNF. X-rays of left shoulder and knee without fractures. Admitted for hypertensive urgency-resolved. Ruled in for group B strep bacteremia and UTI. At baseline seems to have some behavioral issues (daughter however denies) but fluctuating and somewhat worse in the hospital. Patient did not cooperate for MRI brain. Nephrology following.   Assessment & Plan:   Active Problems:   ESRD (end stage renal disease) ([place])   Multiple myeloma (H[facility])   Hypertension associatd with end stage renal disease on dialysis   Hypertensive urgency   Swelling of joint of left knee   Weakness  Neutropenic fever/group B strep bacteremia and sepsis from UTI - On IV Ancef which is  given across HD. Duration to be determined prior to discharge. - Chest x-ray without acute findings. Bilateral knees and shoulder exam not consistent with acute infectious arthritis. Seem chronic changes. - Patient is immunocompromised from multiple myeloma and its treatment. - recurrent fever, repeat blood cultures ordered and they have been negative so far. Would continue with ancef. requested for ID consult for recurrent fevers.  - Plan for TEE, and arthrocentesis. Called cardiology for TEE on Tuesday. Will  make her npo aftermidnight on Monday. - IR consulted for arthrocentesis of the left knee and the fluid will be sent for analysis.  - pt and family still thinking about the LP , will let us k now .   Hypertensive urgency - Initial blood pressure 171/151. Treated briefly in the ED with Cardene drip and initiated her home oral antihypertensive regimen. Blood pressures improved and weaned off Cardene drip. - Continue amlodipine, hydralazine, Imdur. - Blood pressures controlled  B/L shoulders/knee pain and ?? Weakness - CT head without acute findings. No obvious focal deficits.  - X-rays of left shoulder without acute finding. X-ray of left knee shows joint effusion, probable loose body, tricompartmental osteoarthritis. Uric acid: Normal. No acute arthritis findings on physical exam. - Denies history of fall. - ? All related to osteoarthritis versus skeletal metastases from multiple myeloma. - Neurology consultation appreciated:Marland Kitchen However asymmetrical weakness felt to be more related to pain than stroke. Patient did not cooperate with MRI despite Ativan on 5/21. - Discussed with [name], oncology on 5/21 who indicated that patient has refractory multiple myeloma and has very poor prognosis. She recommended steroids for bony pain (however not started due to infection in immunosuppressed patients) or oxycodone (as per RN, patient refuses). She recommended palliative care consultation- discussed with daughter on 5/22 and she does not want to think about palliative care at this time.   ESRD on TTS HD/mild hyperkalemia - Nephrology follow-up appreciated. Hyperkalemia resolved. Status post HD on 5/20.  Pancytopenia - Probably multifactorial related to multiple myeloma and ESRD.  - Anemia stable. Thrombocytopenia resolved. Received last chemotherapy and Granix on 5/17-WBC may  get worse before getting better. Follow daily CBC with differential. Improving WBC count.  Relapsed and refractory multiple  myeloma - No obvious fractures on x-rays of left shoulder and knee.  - Followed at [facility]. Reviewed last progress note from 02/01/16 in care everywhere: No signs or symptoms to suggest progression. Patient determined to continue aggressive treatment. Chemotherapy was held on that day due to [facility] of 0.3 and she has had issues with neutropenia and requires G-CSF support. She received bortezomib, granix, cyclophosphamide on 5/17 at [facility]. - Please see above regarding discussion with oncology  History of behavioral issues/confusion/altered mental status - Monitor. Current ongoing issues. - As per Dr. Moshe Cipro, Nephrology who is familiar with patient, on 5/22 patient seemed to be acting her normal but on 5/23 MS seemed worse. Etiology unclear. Her valtrex dose was reduced. Today she is alert , less confused.   Chronic hyponatremia - May be related to ESRD and multiple myeloma.decreased to 128 today. . Management per nephrology.   DVT prophylaxis: SCDs Code Status: Full Family Communication: Discussed with patient. Marland Kitchen Updated care and answered questions. Discussed about palliative care-she wishes to think about it before making a decision. Disposition Plan: DC to SNF when medically stable.   Consultants:   Nephrology  Neurology  Procedures:   HD  Antimicrobials:   Valacyclovir 5/20 >  Cefepime 5/21 >5/23  Ancef 5/23  Subjective: She is alert and appears oriented.   Objective:  Filed Vitals:   02/25/16 1100 02/25/16 1130 02/25/16 1138 02/25/16 1240  BP: 105/59 93/67 109/65 100/53  Pulse: 89 94 100 82  Temp:   99.2 F (37.3 C) 99.7 F (37.6 C)  TempSrc:   Oral Oral  Resp:   18 18  Height:      Weight:   55 kg (121 lb 4.1 oz)   SpO2:   95% 97%    Intake/Output Summary (Last 24 hours) at 02/25/16 1657 Last data filed at 02/25/16 1244  Gross per 24 hour  Intake    237 ml  Output   2500 ml  Net  -2263 ml   Filed Weights   02/23/16 1113 02/25/16  0511 02/25/16 1138  Weight: 51.8 kg (114 lb 3.2 oz) 51.619 kg (113 lb 12.8 oz) 55 kg (121 lb 4.1 oz)    Examination:  General exam: Pleasant elderly female without any complaints.  Respiratory system: Clear to auscultation. Respiratory effort normal. Cardiovascular system: S1 & S2 heard, RRR. No JVD, murmurs, rubs, gallops or clicks. No pedal edema.  Gastrointestinal system: Abdomen is nondistended, soft and nontender. No organomegaly or masses felt. Normal bowel sounds heard. Central nervous system: Alert and oriented to place and person. No focal neurological deficits. Extremities: swelling of both knees, tender to touch. Skin: No rashes, lesions or ulcers Psychiatry: Judgement and insight appear poor. Mood & affect appropriate.     Data Reviewed: I have personally reviewed following labs and imaging studies  CBC:  Recent Labs Lab 02/20/16 0431 02/21/16 0328 02/22/16 0400 02/23/16 0803 02/24/16 1206 02/25/16 0741  WBC 1.2* 2.1* 2.3* 3.1* 3.7* 4.5  NEUTROABS 0.3* 0.9* 1.3*  --   --   --   HGB 11.2* 10.5* 10.3* 9.6* 9.5* 9.0*  HCT 34.0* 32.3* 32.7* 30.3* 30.3* 28.1*  MCV 106.3* 105.9* 109.4* 109.0* 110.2* 108.9*  PLT 160 184 200 222 221 299   Basic Metabolic Panel:  Recent Labs Lab 02/19/16 0427 02/21/16 1511 02/22/16 1005 02/23/16 0803 02/24/16 1205 02/25/16 0741  NA 131* 130*  --  128* 128* 125*  K 4.4 5.5* 4.3 4.5 4.4 4.6  CL 93* 92*  --  92* 91* 89*  CO2 26 23  --  _0 GLUCOSE 109* 76  --  100* 92 98  BUN 20 70*  --  62* 44* 66*  CREATININE 4.29* 8.59*  --  6.86* 4.98* 6.38*  CALCIUM 8.0* 8.0*  --  8.4* 9.4 8.9  PHOS  --  7.2*  --  5.8* 6.3* 6.6*   GFR: Estimated Creatinine Clearance: 6.5 mL/min (by C-G formula based on Cr of 6.38). Liver Function Tests:  Recent Labs Lab 02/21/16 1511 02/23/16 0803 02/24/16 1205 02/25/16 0741  ALBUMIN 2.1* 1.9* 1.9* 1.9*   No results for input(s): LIPASE, AMYLASE in the last 168 hours. No results for  input(s): AMMONIA in the last 168 hours. Coagulation Profile: No results for input(s): INR, PROTIME in the last 168 hours. Cardiac Enzymes: No results for input(s): CKTOTAL, CKMB, CKMBINDEX, TROPONINI in the last 168 hours. BNP (last 3 results) No results for input(s): PROBNP in the last 8760 hours. HbA1C: No results for input(s): HGBA1C in the last 72 hours. CBG: No results for input(s): GLUCAP in the last 168 hours. Lipid Profile: No results for input(s): CHOL, HDL, LDLCALC, TRIG, CHOLHDL, LDLDIRECT in the last 72 hours. Thyroid Function Tests: No results for input(s): TSH, T4TOTAL, FREET4, T3FREE, THYROIDAB in the last 72 hours. Anemia Panel: No results for input(s): VITAMINB12, FOLATE, FERRITIN, TIBC, IRON, RETICCTPCT in the last 72 hours.  Sepsis Labs: No results for input(s): PROCALCITON, LATICACIDVEN in the last 168 hours.  Recent Results (from the past 240 hour(s))  Urine culture     Status: Abnormal   Collection Time: 02/18/16  2:45 AM  Result Value Ref Range Status   Specimen Description URINE, CLEAN CATCH  Final   Special Requests NONE  Final   Culture (A)  Final    >=100,000 COLONIES/mL GROUP B STREP(S.AGALACTIAE)ISOLATED TESTING AGAINST S. AGALACTIAE NOT ROUTINELY PERFORMED DUE TO PREDICTABILITY OF AMP/PEN/VAN SUSCEPTIBILITY.    Report Status 02/19/2016 FINAL  Final  MRSA PCR Screening     Status: None   Collection Time: 02/18/16  6:29 PM  Result Value Ref Range Status   MRSA by PCR NEGATIVE NEGATIVE Final    Comment:        The GeneXpert MRSA Assay (FDA approved for NASAL specimens only), is one component of a comprehensive MRSA colonization surveillance program. It is not intended to diagnose MRSA infection nor to guide or monitor treatment for MRSA infections.   Culture, blood (routine x 2)     Status: Abnormal   Collection Time: 02/18/16  9:40 PM  Result Value Ref Range Status   Specimen Description BLOOD HEMODIALYSIS CATHETER  Final   Special  Requests BOTTLES DRAWN AEROBIC AND ANAEROBIC 10CC  Final   Culture  Setup Time   Final    GRAM POSITIVE COCCI IN CHAINS IN BOTH AEROBIC AND ANAEROBIC BOTTLES CRITICAL RESULT CALLED TO, READ BACK BY AND VERIFIED WITH: C DAVIS,RN AT 1144 02/19/16 BY L BENFIELD    Culture (A)  Final    GROUP B STREP(S.AGALACTIAE)ISOLATED SUSCEPTIBILITIES PERFORMED ON PREVIOUS CULTURE WITHIN THE LAST 5 DAYS.    Report Status 02/22/2016 FINAL  Final  Culture, blood (routine x 2)     Status: Abnormal   Collection Time: 02/18/16  9:50 PM  Result Value Ref Range Status   Specimen Description BLOOD HEMODIALYSIS CATHETER  Final  Special Requests BOTTLES DRAWN AEROBIC AND ANAEROBIC 10CC   Final   Culture  Setup Time   Final    GRAM POSITIVE COCCI IN CHAINS IN BOTH AEROBIC AND ANAEROBIC BOTTLES CRITICAL RESULT CALLED TO, READ BACK BY AND VERIFIED WITH: Beulah Gandy D AT 1102 02/19/16 BY L BENFIELD    Culture GROUP B STREP(S.AGALACTIAE)ISOLATED (A)  Final   Report Status 02/22/2016 FINAL  Final   Organism ID, Bacteria GROUP B STREP(S.AGALACTIAE)ISOLATED  Final      Susceptibility   Group b strep(s.agalactiae)isolated - MIC*    CLINDAMYCIN <=0.25 SENSITIVE Sensitive     AMPICILLIN <=0.25 SENSITIVE Sensitive     ERYTHROMYCIN <=0.12 SENSITIVE Sensitive     VANCOMYCIN 0.5 SENSITIVE Sensitive     CEFTRIAXONE <=0.12 SENSITIVE Sensitive     LEVOFLOXACIN 1 SENSITIVE Sensitive     * GROUP B STREP(S.AGALACTIAE)ISOLATED  Blood Culture ID Panel (Reflexed)     Status: Abnormal   Collection Time: 02/18/16  9:50 PM  Result Value Ref Range Status   Enterococcus species NOT DETECTED NOT DETECTED Final   Vancomycin resistance NOT DETECTED NOT DETECTED Final   Listeria monocytogenes NOT DETECTED NOT DETECTED Final   Staphylococcus species NOT DETECTED NOT DETECTED Final   Staphylococcus aureus NOT DETECTED NOT DETECTED Final   Methicillin resistance NOT DETECTED NOT DETECTED Final   Streptococcus species DETECTED (A) NOT  DETECTED Final    Comment: CRITICAL RESULT CALLED TO, READ BACK BY AND VERIFIED WITH: Beulah Gandy D AT 1102 02/19/16 BY L BENFIELD    Streptococcus agalactiae DETECTED (A) NOT DETECTED Final    Comment: CRITICAL RESULT CALLED TO, READ BACK BY AND VERIFIED WITH: Beulah Gandy D AT 1102 02/19/16 BY L BENFIELD    Streptococcus pneumoniae NOT DETECTED NOT DETECTED Final   Streptococcus pyogenes NOT DETECTED NOT DETECTED Final   Acinetobacter baumannii NOT DETECTED NOT DETECTED Final   Enterobacteriaceae species NOT DETECTED NOT DETECTED Final   Enterobacter cloacae complex NOT DETECTED NOT DETECTED Final   Escherichia coli NOT DETECTED NOT DETECTED Final   Klebsiella oxytoca NOT DETECTED NOT DETECTED Final   Klebsiella pneumoniae NOT DETECTED NOT DETECTED Final   Proteus species NOT DETECTED NOT DETECTED Final   Serratia marcescens NOT DETECTED NOT DETECTED Final   Carbapenem resistance NOT DETECTED NOT DETECTED Final   Haemophilus influenzae NOT DETECTED NOT DETECTED Final   Neisseria meningitidis NOT DETECTED NOT DETECTED Final   Pseudomonas aeruginosa NOT DETECTED NOT DETECTED Final   Candida albicans NOT DETECTED NOT DETECTED Final   Candida glabrata NOT DETECTED NOT DETECTED Final   Candida krusei NOT DETECTED NOT DETECTED Final   Candida parapsilosis NOT DETECTED NOT DETECTED Final   Candida tropicalis NOT DETECTED NOT DETECTED Final  Culture, blood (Routine X 2) w Reflex to ID Panel     Status: None (Preliminary result)   Collection Time: 02/22/16 11:15 AM  Result Value Ref Range Status   Specimen Description BLOOD RIGHT ARM  Final   Special Requests IN PEDIATRIC BOTTLE 3CC  Final   Culture NO GROWTH 3 DAYS  Final   Report Status PENDING  Incomplete  Culture, blood (Routine X 2) w Reflex to ID Panel     Status: None (Preliminary result)   Collection Time: 02/22/16 11:23 AM  Result Value Ref Range Status   Specimen Description BLOOD RIGHT HAND  Final   Special Requests BOTTLES  DRAWN AEROBIC ONLY [place]  Final   Culture NO GROWTH 3 DAYS  Final   Report  Status PENDING  Incomplete         Radiology Studies: No results found.      Scheduled Meds: . calcium acetate  667 mg Oral TID WC  .  ceFAZolin (ANCEF) IV  2 g Intravenous Q T,Th,Sa-HD  . cinacalcet  60 mg Oral Q breakfast  . darbepoetin (ARANESP) injection - DIALYSIS  60 mcg Intravenous Q Thu-HD  . [START ON 02/28/2016] doxercalciferol  3 mcg Intravenous Q T,Th,Sa-HD  . famotidine  10 mg Oral Daily  . feeding supplement (NEPRO CARB STEADY)  237 mL Oral BID BM  . feeding supplement (PRO-STAT SUGAR FREE 64)  30 mL Oral BID  . [START ON 02/26/2016] isosorbide mononitrate  60 mg Oral Daily  . lanthanum  2,000 mg Oral TID WC  . mirtazapine  15 mg Oral QHS  . multivitamin  1 tablet Oral QHS  . pantoprazole  40 mg Oral Daily  . sodium chloride flush  3 mL Intravenous Q12H  . sodium chloride flush  3 mL Intravenous Q12H  . valACYclovir  500 mg Oral Q48H   Continuous Infusions:     LOS: 7 days    Time spent: 30 minutes.    Hosie Poisson, MD [facility] Pager [phone] If 7PM-7AM, please contact night-coverage [email or web address] Password [facility] 02/25/2016, 4:57 PM

## 2016-02-25 NOTE — Progress Notes (Signed)
Echocardiogram 2D Echocardiogram has been performed.  [name] 02/25/2016, 3:23 PM

## 2016-02-25 NOTE — Progress Notes (Signed)
Pt screams when we try and reposition her as she is in pain. Pt adamantly refusing any type of pain medication stating, " Its just going to come right back." MD made aware.

## 2016-02-25 NOTE — Procedures (Signed)
Patient was seen on dialysis and the procedure was supervised.  BFR 400  Via AVF BP is  119/77.   Patient appears to be tolerating treatment well  Zein Helbing A 02/25/2016

## 2016-02-25 NOTE — Progress Notes (Signed)
[facility] Progress Note  Assessment/Plan: 1. Neutropenia/ fevers - Group b strep bacteremia - on ancef per pharmacy and primary - Tmax 100.1- fever curve does seem to be decreasing  FYI we can give ancef at OP dialysis; ID following symptoms suggested of disseminated dz; consider arthrocentesis, TEE, LP.    2. L sided weakness - felt to be due to pain/ MS origin. Neuro saw- rec getting MRI but she could not cooperate with exam or MRI - a non issue 3. HTN'sive urgency - resolved on amlodipine and hydralazine - actually now BPs low- suspect was not taking meds as should - now on no BP meds 4. ESRD - TTS HD- today via AVF K 4.6 5. Vol - try for EDW- better after HD goal 3 L today titrate EDW down 6. Anemia - HGB now 10.3-->9.5 >9 Aranesp 60 resumed 5/25  7. Metabolic bone disease - Continue binders- fosrenol and phoslo, VDRA, sensipar- phos 6.3- Corr Ca much higher today at 10.6- hold hectorol today and resume at 3 next tmt - change to 2 K 2 Ca bath today.  8. Nutrition - Albumin 2.7---> 1.9 . Renal diet/prostat/renal vit/ added nepro 9. Myeloma- recent chemoRx at [facility] 4-5 d ago- neutropenic- received stimulant - WBC climbing  10. MS-this is my first day seeing her. She can be emotionally labile at baseline. Calm and well oriented/articulate this am- seems that MS changes have resolved- do not think she needs work up for infectious neuro pathology  Myriam Jacobson, PA-C [place] [phone] 02/25/2016,8:12 AM  LOS: 7 days    Patient seen and examined, agree with above note with above modifications. MS has remained better and seems to be at baseline- fever curve seems to be decreasing- ID rec further testing- per primary team- I do not think neuro infectious dz needs to be evald Corliss Parish, MD 02/25/2016     Subjective:   C/o being sleepy and severe left knee pain and bilateral shoulder pain. No diarrhea  Objective Filed Vitals:   02/25/16 0511 02/25/16 0722 02/25/16 0738 02/25/16 0800  BP: 97/59 121/75 118/77 117/72  Pulse: 81 81 81 75  Temp: 100 F (37.8 C) 98.6 F (37 C)    TempSrc: Oral Oral    Resp:  19    Height:      Weight: 51.619 kg (113 lb 12.8 oz)     SpO2: 98% 98%     Physical Exam General: NAD on HD; alert and oriented x 3 Heart: RRR w some ectopy2/6 murmur Lungs: no rales Abdomen: soft NT Extremities: left knee swollen, tender hurts with movement Dialysis Access: left upper AVF  Dialysis Orders: TTS SW 3h 73min 57kg 2/2.25 bath Hep none LUA AVF Aranesp 60 mcg IV weekly (last dose 02/16/16 HGB 11.1) Hectoral 4 mcg IV q treatment (Ca 7.7 C Ca 8.2 PTH 396 01/26/16 phos 5.7 02/09/16)   Additional Objective Labs: Basic Metabolic Panel:  Recent Labs Lab 02/23/16 0803 02/24/16 1205 02/25/16 0741  NA 128* 128* 125*  K 4.5 4.4 4.6  CL 92* 91* 89*  CO2 24 26 24   GLUCOSE 100* 92 98  BUN 62* 44* 66*  CREATININE 6.86* 4.98* 6.38*  CALCIUM 8.4* 9.4 8.9  PHOS 5.8* 6.3* 6.6*   Liver Function Tests:  Recent Labs Lab 02/23/16 0803 02/24/16 1205 02/25/16 0741  ALBUMIN 1.9* 1.9* 1.9*   CBC:  Recent Labs Lab 02/20/16 0431 02/21/16 0328 02/22/16 0400 02/23/16 0803 02/24/16 1206 02/25/16 0741  WBC 1.2* 2.1* 2.3* 3.1* 3.7* 4.5  NEUTROABS 0.3* 0.9* 1.3*  --   --   --   HGB 11.2* 10.5* 10.3* 9.6* 9.5* 9.0*  HCT 34.0* 32.3* 32.7* 30.3* 30.3* 28.1*  MCV 106.3* 105.9* 109.4* 109.0* 110.2* 108.9*  PLT 160 184 200 222 221 222   Blood Culture    Component Value Date/Time   SDES BLOOD RIGHT HAND 02/22/2016 1123   SPECREQUEST BOTTLES DRAWN AEROBIC ONLY [place] 02/22/2016 1123   CULT NO GROWTH 2 DAYS 02/22/2016 1123   REPTSTATUS PENDING 02/22/2016 1123   Medications:   . calcium acetate  667 mg Oral TID WC  .  ceFAZolin (ANCEF) IV  2 g Intravenous Q T,Th,Sa-HD  . cinacalcet  60 mg Oral Q breakfast  . darbepoetin (ARANESP) injection - DIALYSIS  60 mcg Intravenous Q Thu-HD  .  [START ON 02/28/2016] doxercalciferol  2 mcg Intravenous Q T,Th,Sa-HD  . famotidine  10 mg Oral Daily  . feeding supplement (NEPRO CARB STEADY)  237 mL Oral BID BM  . feeding supplement (PRO-STAT SUGAR FREE 64)  30 mL Oral BID  . isosorbide mononitrate  60 mg Oral Daily  . lanthanum  2,000 mg Oral TID WC  . mirtazapine  15 mg Oral QHS  . multivitamin  1 tablet Oral QHS  . pantoprazole  40 mg Oral Daily  . sodium chloride flush  3 mL Intravenous Q12H  . sodium chloride flush  3 mL Intravenous Q12H  . valACYclovir  500 mg Oral Q48H

## 2016-02-26 ENCOUNTER — Inpatient Hospital Stay (HOSPITAL_COMMUNITY): Payer: Medicare Other

## 2016-02-26 DIAGNOSIS — I10 Essential (primary) hypertension: Secondary | ICD-10-CM | POA: Diagnosis present

## 2016-02-26 LAB — CBC
HCT: 29.6 % — ABNORMAL LOW (ref 36.0–46.0)
HEMOGLOBIN: 9.3 g/dL — AB (ref 12.0–15.0)
MCH: 34.6 pg — AB (ref 26.0–34.0)
MCHC: 31.4 g/dL (ref 30.0–36.0)
MCV: 110 fL — AB (ref 78.0–100.0)
Platelets: 181 10*3/uL (ref 150–400)
RBC: 2.69 MIL/uL — ABNORMAL LOW (ref 3.87–5.11)
RDW: 14.5 % (ref 11.5–15.5)
WBC: 6.1 10*3/uL (ref 4.0–10.5)

## 2016-02-26 LAB — GRAM STAIN

## 2016-02-26 LAB — LACTIC ACID, PLASMA: Lactic Acid, Venous: 1.9 mmol/L (ref 0.5–2.0)

## 2016-02-26 LAB — SYNOVIAL CELL COUNT + DIFF, W/ CRYSTALS
Crystals, Fluid: NONE SEEN
EOSINOPHILS-SYNOVIAL: 0 % (ref 0–1)
Lymphocytes-Synovial Fld: 1 % (ref 0–20)
MONOCYTE-MACROPHAGE-SYNOVIAL FLUID: 3 % — AB (ref 50–90)
Neutrophil, Synovial: 96 % — ABNORMAL HIGH (ref 0–25)
WBC, Synovial: 42500 /mm3 — ABNORMAL HIGH (ref 0–200)

## 2016-02-26 LAB — HEPATITIS B SURFACE ANTIBODY,QUALITATIVE: Hep B S Ab: NONREACTIVE

## 2016-02-26 MED ORDER — ACETAMINOPHEN 325 MG PO TABS
325.0000 mg | ORAL_TABLET | Freq: Once | ORAL | Status: AC
  Administered 2016-02-26: 325 mg via ORAL
  Filled 2016-02-26: qty 1

## 2016-02-26 NOTE — Progress Notes (Addendum)
PROGRESS NOTE  [name]  TKZ:[id number] DOB: [birth year]  DOA: 02/17/2016 PCP: Benito Mccreedy, MD   Brief Narrative:  75 year old female with ESRD on TTS HD, multiple myeloma, HTN, GERD, anemia of chronic disease, malnutrition, presented to [facility] ED on 5/19 with complaints of left shoulder and knee pain which began after HD the day prior. Unable to clearly say if she was weak on that side. Also complained of nausea, vomiting and diarrhea. No acute issues as per OP dialysis staff. Neurology evaluated in ED and did not believe that she had a CVA. Markedly elevated blood pressure on arrival 171/151-briefly on Cardene drip which was weaned off when blood pressures improved. As per nephrology, history of behavioral issues and resident of SNF. X-rays of left shoulder and knee without fractures. Admitted for hypertensive urgency-resolved. Ruled in for group B strep bacteremia and UTI. At baseline seems to have some behavioral issues (daughter however denies) but fluctuating and somewhat worse in the hospital. Patient did not cooperate for MRI brain. Nephrology following.   Assessment & Plan:   Active Problems:   ESRD (end stage renal disease) ([place])   Multiple myeloma (H[facility])   Hypertension associatd with end stage renal disease on dialysis   Hypertensive urgency   Swelling of joint of left knee   Weakness   Fever  Neutropenic fever/group B strep bacteremia and sepsis from UTI - On IV Ancef which is  given across HD. Duration to be determined prior to discharge. - Chest x-ray without acute findings. Bilateral knees and shoulder exam not consistent with acute infectious arthritis. Seem chronic changes. - Patient is immunocompromised from multiple myeloma and its treatment. - recurrent fever, repeat blood cultures ordered and they have been negative so far. Would continue with ancef. requested for ID consult for recurrent fevers.  - Plan for TEE, and arthrocentesis. Called cardiology for TEE on  Tuesday. Will make her npo aftermidnight on Monday. - Radiology consulted for arthrocentesis of the left knee and the fluid will be sent for analysis. Orthopedics consulted and plan for knee washout tomorrow.  - pt and family still thinking about the LP , will let us know .   Hypertensive urgency - Initial blood pressure 171/151. Treated briefly in the ED with Cardene drip and initiated her home oral antihypertensive regimen. Blood pressures improved and weaned off Cardene drip. - her pressures have been low since yesterday, initially norvasc discontinued and today imdur was held.    B/L shoulders/knee pain and ?? Weakness - CT head without acute findings. No obvious focal deficits.  - X-rays of left shoulder without acute finding. X-ray of left knee shows joint effusion, probable loose body, tricompartmental osteoarthritis. Uric acid: Normal. No acute arthritis findings on physical exam. - Denies history of fall. - ? All related to osteoarthritis versus skeletal metastases from multiple myeloma. - Neurology consultation appreciated:Marland Kitchen However asymmetrical weakness felt to be more related to pain than stroke. Patient did not cooperate with MRI despite Ativan on 5/21. - Discussed with [name], oncology on 5/21 who indicated that patient has refractory multiple myeloma and has very poor prognosis. She recommended steroids for bony pain (however not started due to infection in immunosuppressed patients) or oxycodone (as per RN, patient refuses). She recommended palliative care consultation- discussed with daughter on 5/22 and she does not want to think about palliative care at this time.  - SNF on discharge.  ESRD on TTS HD/mild hyperkalemia - Nephrology follow-up appreciated. Hyperkalemia resolved. Status post HD TTS  Pancytopenia -  Probably multifactorial related to multiple myeloma and ESRD.  - Anemia stable. Thrombocytopenia resolved. Received last chemotherapy and Granix on 5/17-WBC may  get worse before getting better. Follow daily CBC with differential. Improving WBC count.  Relapsed and refractory multiple myeloma - No obvious fractures on x-rays of left shoulder and knee.  - Followed at [facility]. Reviewed last progress note from 02/01/16 in care everywhere: No signs or symptoms to suggest progression. Patient determined to continue aggressive treatment. Chemotherapy was held on that day due to [facility] of 0.3 and she has had issues with neutropenia and requires G-CSF support. She received bortezomib, granix, cyclophosphamide on 5/17 at [facility]. - Please see above regarding discussion with oncology  History of behavioral issues/confusion/altered mental status - Monitor. Current ongoing issues. - As per Dr. Moshe Cipro, Nephrology who is familiar with patient, on 5/22 patient seemed to be acting her normal but on 5/23 MS seemed worse. Etiology unclear. Her valtrex dose was reduced. Today she is alert , less confused.   Chronic hyponatremia - May be related to ESRD and multiple myeloma.decreased to 128 today. . Management per nephrology.     DVT prophylaxis: SCDs Code Status: Full Family Communication: Discussed with patient. Marland Kitchen Updated care and answered questions. Discussed with daughter on 5/27. Disposition Plan: DC to SNF when medically stable.   Consultants:   Nephrology  Neurology  Orthopedics.   Radiology  Infectious disease    Procedures:   HD  Arthrocentesis of the left knee  TEE.  Antimicrobials:   Valacyclovir 5/20 >  Cefepime 5/21 >5/23  Ancef 5/23  Subjective: She is alert and appears oriented.    Objective:  Filed Vitals:   02/26/16 1116 02/26/16 1330 02/26/16 1628 02/26/16 1635  BP: '94/60 99/55 92/63 '   Pulse: 71   56  Temp: 98.7 F (37.1 C)   99.1 F (37.3 C)  TempSrc: Oral   Oral  Resp: 16     Height:      Weight:      SpO2: 99%   100%    Intake/Output Summary (Last 24 hours) at 02/26/16 1721 Last data  filed at 02/26/16 3557  Gross per 24 hour  Intake    573 ml  Output      0 ml  Net    573 ml   Filed Weights   02/25/16 0511 02/25/16 1138 02/26/16 0530  Weight: 51.619 kg (113 lb 12.8 oz) 55 kg (121 lb 4.1 oz) 56.7 kg (125 lb)    Examination:  General exam: Pleasant elderly female without any complaints.  Respiratory system: Clear to auscultation. Respiratory effort normal. Cardiovascular system: S1 & S2 heard, RRR. No JVD, murmurs, rubs, gallops or clicks. No pedal edema.  Gastrointestinal system: Abdomen is nondistended, soft and nontender. No organomegaly or masses felt. Normal bowel sounds heard. Central nervous system: Alert and oriented to place and person. No focal neurological deficits. Extremities: swelling of both knees, tender to touch. Skin: No rashes, lesions or ulcers Psychiatry: Judgement and insight appear poor. Mood & affect appropriate.     Data Reviewed: I have personally reviewed following labs and imaging studies  CBC:  Recent Labs Lab 02/20/16 0431 02/21/16 0328 02/22/16 0400 02/23/16 0803 02/24/16 1206 02/25/16 0741 02/26/16 1015  WBC 1.2* 2.1* 2.3* 3.1* 3.7* 4.5 6.1  NEUTROABS 0.3* 0.9* 1.3*  --   --   --   --   HGB 11.2* 10.5* 10.3* 9.6* 9.5* 9.0* 9.3*  HCT 34.0* 32.3* 32.7* 30.3*  30.3* 28.1* 29.6*  MCV 106.3* 105.9* 109.4* 109.0* 110.2* 108.9* 110.0*  PLT 160 184 200 222 221 222 161   Basic Metabolic Panel:  Recent Labs Lab 02/21/16 1511 02/22/16 1005 02/23/16 0803 02/24/16 1205 02/25/16 0741  NA 130*  --  128* 128* 125*  K 5.5* 4.3 4.5 4.4 4.6  CL 92*  --  92* 91* 89*  CO2 23  --  '24 26 24  ' GLUCOSE 76  --  100* 92 98  BUN 70*  --  62* 44* 66*  CREATININE 8.59*  --  6.86* 4.98* 6.38*  CALCIUM 8.0*  --  8.4* 9.4 8.9  PHOS 7.2*  --  5.8* 6.3* 6.6*   GFR: Estimated Creatinine Clearance: 6.7 mL/min (by C-G formula based on Cr of 6.38). Liver Function Tests:  Recent Labs Lab 02/21/16 1511 02/23/16 0803 02/24/16 1205  02/25/16 0741  ALBUMIN 2.1* 1.9* 1.9* 1.9*   No results for input(s): LIPASE, AMYLASE in the last 168 hours. No results for input(s): AMMONIA in the last 168 hours. Coagulation Profile: No results for input(s): INR, PROTIME in the last 168 hours. Cardiac Enzymes: No results for input(s): CKTOTAL, CKMB, CKMBINDEX, TROPONINI in the last 168 hours. BNP (last 3 results) No results for input(s): PROBNP in the last 8760 hours. HbA1C: No results for input(s): HGBA1C in the last 72 hours. CBG: No results for input(s): GLUCAP in the last 168 hours. Lipid Profile: No results for input(s): CHOL, HDL, LDLCALC, TRIG, CHOLHDL, LDLDIRECT in the last 72 hours. Thyroid Function Tests: No results for input(s): TSH, T4TOTAL, FREET4, T3FREE, THYROIDAB in the last 72 hours. Anemia Panel: No results for input(s): VITAMINB12, FOLATE, FERRITIN, TIBC, IRON, RETICCTPCT in the last 72 hours.  Sepsis Labs:  Recent Labs Lab 02/26/16 1010  LATICACIDVEN 1.9    Recent Results (from the past 240 hour(s))  Urine culture     Status: Abnormal   Collection Time: 02/18/16  2:45 AM  Result Value Ref Range Status   Specimen Description URINE, CLEAN CATCH  Final   Special Requests NONE  Final   Culture (A)  Final    >=100,000 COLONIES/mL GROUP B STREP(S.AGALACTIAE)ISOLATED TESTING AGAINST S. AGALACTIAE NOT ROUTINELY PERFORMED DUE TO PREDICTABILITY OF AMP/PEN/VAN SUSCEPTIBILITY.    Report Status 02/19/2016 FINAL  Final  MRSA PCR Screening     Status: None   Collection Time: 02/18/16  6:29 PM  Result Value Ref Range Status   MRSA by PCR NEGATIVE NEGATIVE Final    Comment:        The GeneXpert MRSA Assay (FDA approved for NASAL specimens only), is one component of a comprehensive MRSA colonization surveillance program. It is not intended to diagnose MRSA infection nor to guide or monitor treatment for MRSA infections.   Culture, blood (routine x 2)     Status: Abnormal   Collection Time: 02/18/16  9:40  PM  Result Value Ref Range Status   Specimen Description BLOOD HEMODIALYSIS CATHETER  Final   Special Requests BOTTLES DRAWN AEROBIC AND ANAEROBIC 10CC  Final   Culture  Setup Time   Final    GRAM POSITIVE COCCI IN CHAINS IN BOTH AEROBIC AND ANAEROBIC BOTTLES CRITICAL RESULT CALLED TO, READ BACK BY AND VERIFIED WITH: C DAVIS,RN AT 1144 02/19/16 BY L BENFIELD    Culture (A)  Final    GROUP B STREP(S.AGALACTIAE)ISOLATED SUSCEPTIBILITIES PERFORMED ON PREVIOUS CULTURE WITHIN THE LAST 5 DAYS.    Report Status 02/22/2016 FINAL  Final  Culture, blood (routine x 2)  Status: Abnormal   Collection Time: 02/18/16  9:50 PM  Result Value Ref Range Status   Specimen Description BLOOD HEMODIALYSIS CATHETER  Final   Special Requests BOTTLES DRAWN AEROBIC AND ANAEROBIC 10CC   Final   Culture  Setup Time   Final    GRAM POSITIVE COCCI IN CHAINS IN BOTH AEROBIC AND ANAEROBIC BOTTLES CRITICAL RESULT CALLED TO, READ BACK BY AND VERIFIED WITH: Beulah Gandy D AT 1102 02/19/16 BY L BENFIELD    Culture GROUP B STREP(S.AGALACTIAE)ISOLATED (A)  Final   Report Status 02/22/2016 FINAL  Final   Organism ID, Bacteria GROUP B STREP(S.AGALACTIAE)ISOLATED  Final      Susceptibility   Group b strep(s.agalactiae)isolated - MIC*    CLINDAMYCIN <=0.25 SENSITIVE Sensitive     AMPICILLIN <=0.25 SENSITIVE Sensitive     ERYTHROMYCIN <=0.12 SENSITIVE Sensitive     VANCOMYCIN 0.5 SENSITIVE Sensitive     CEFTRIAXONE <=0.12 SENSITIVE Sensitive     LEVOFLOXACIN 1 SENSITIVE Sensitive     * GROUP B STREP(S.AGALACTIAE)ISOLATED  Blood Culture ID Panel (Reflexed)     Status: Abnormal   Collection Time: 02/18/16  9:50 PM  Result Value Ref Range Status   Enterococcus species NOT DETECTED NOT DETECTED Final   Vancomycin resistance NOT DETECTED NOT DETECTED Final   Listeria monocytogenes NOT DETECTED NOT DETECTED Final   Staphylococcus species NOT DETECTED NOT DETECTED Final   Staphylococcus aureus NOT DETECTED NOT DETECTED  Final   Methicillin resistance NOT DETECTED NOT DETECTED Final   Streptococcus species DETECTED (A) NOT DETECTED Final    Comment: CRITICAL RESULT CALLED TO, READ BACK BY AND VERIFIED WITH: Beulah Gandy D AT 1102 02/19/16 BY L BENFIELD    Streptococcus agalactiae DETECTED (A) NOT DETECTED Final    Comment: CRITICAL RESULT CALLED TO, READ BACK BY AND VERIFIED WITH: Beulah Gandy D AT 1102 02/19/16 BY L BENFIELD    Streptococcus pneumoniae NOT DETECTED NOT DETECTED Final   Streptococcus pyogenes NOT DETECTED NOT DETECTED Final   Acinetobacter baumannii NOT DETECTED NOT DETECTED Final   Enterobacteriaceae species NOT DETECTED NOT DETECTED Final   Enterobacter cloacae complex NOT DETECTED NOT DETECTED Final   Escherichia coli NOT DETECTED NOT DETECTED Final   Klebsiella oxytoca NOT DETECTED NOT DETECTED Final   Klebsiella pneumoniae NOT DETECTED NOT DETECTED Final   Proteus species NOT DETECTED NOT DETECTED Final   Serratia marcescens NOT DETECTED NOT DETECTED Final   Carbapenem resistance NOT DETECTED NOT DETECTED Final   Haemophilus influenzae NOT DETECTED NOT DETECTED Final   Neisseria meningitidis NOT DETECTED NOT DETECTED Final   Pseudomonas aeruginosa NOT DETECTED NOT DETECTED Final   Candida albicans NOT DETECTED NOT DETECTED Final   Candida glabrata NOT DETECTED NOT DETECTED Final   Candida krusei NOT DETECTED NOT DETECTED Final   Candida parapsilosis NOT DETECTED NOT DETECTED Final   Candida tropicalis NOT DETECTED NOT DETECTED Final  Culture, blood (Routine X 2) w Reflex to ID Panel     Status: None (Preliminary result)   Collection Time: 02/22/16 11:15 AM  Result Value Ref Range Status   Specimen Description BLOOD RIGHT ARM  Final   Special Requests IN PEDIATRIC BOTTLE 3CC  Final   Culture NO GROWTH 4 DAYS  Final   Report Status PENDING  Incomplete  Culture, blood (Routine X 2) w Reflex to ID Panel     Status: None (Preliminary result)   Collection Time: 02/22/16 11:23 AM   Result Value Ref Range Status   Specimen Description BLOOD  RIGHT HAND  Final   Special Requests BOTTLES DRAWN AEROBIC ONLY [place]  Final   Culture NO GROWTH 4 DAYS  Final   Report Status PENDING  Incomplete  Stat Gram stain     Status: None   Collection Time: 02/26/16  1:40 PM  Result Value Ref Range Status   Specimen Description FLUID KNEE LEFT  Final   Special Requests Immunocompromised  Final   Gram Stain   Final    ABUNDANT WBC PRESENT, PREDOMINANTLY PMN NO ORGANISMS SEEN    Report Status 02/26/2016 FINAL  Final         Radiology Studies: Dg Fluoro Guided Needle Plc Aspiration/injection Loc  02/26/2016  CLINICAL DATA:  Left knee pain, swelling and increased warmth. Moderate-sized left knee joint effusion on recent radiographs. Sepsis. Clinical concern for infected knee. EXAM: FLUORO GUIDED NEEDLE PLACEMENT CONTRAST:  None used. FLUOROSCOPY TIME:  Radiation Exposure Index (as provided by the fluoroscopic device): Not applicable If the device does not provide the exposure index: Fluoroscopy Time (in minutes and seconds):  0 minutes 1 second Number of Acquired Images:  1 screen save COMPARISON:  Left knee radiographs dated 02/17/2016. FINDINGS: Using sterile technique, 1% lidocaine and fluoroscopic guidance, an 18 gauge spinal needle was inserted into the patellofemoral joint space using a lateral approach. 30 cc of clear, reddish fluid was withdrawn and sent to the laboratory for testing. The patient tolerated the procedure well with no immediate complications. IMPRESSION: Successful fluoroscopic guided left knee needle aspiration. Electronically Signed   By: Claudie Revering M.D.   On: 02/26/2016 14:02        Scheduled Meds: . calcium acetate  667 mg Oral TID WC  .  ceFAZolin (ANCEF) IV  2 g Intravenous Q T,Th,Sa-HD  . cinacalcet  60 mg Oral Q breakfast  . darbepoetin (ARANESP) injection - DIALYSIS  60 mcg Intravenous Q Thu-HD  . [START ON 02/28/2016] doxercalciferol  3 mcg  Intravenous Q T,Th,Sa-HD  . famotidine  10 mg Oral Daily  . feeding supplement (NEPRO CARB STEADY)  237 mL Oral BID BM  . feeding supplement (PRO-STAT SUGAR FREE 64)  30 mL Oral BID  . isosorbide mononitrate  60 mg Oral Daily  . lanthanum  2,000 mg Oral TID WC  . mirtazapine  15 mg Oral QHS  . multivitamin  1 tablet Oral QHS  . pantoprazole  40 mg Oral Daily  . sodium chloride flush  3 mL Intravenous Q12H  . sodium chloride flush  3 mL Intravenous Q12H  . valACYclovir  500 mg Oral Q48H   Continuous Infusions:     LOS: 8 days    Time spent: 30 minutes.    Hosie Poisson, MD [facility] Pager [phone] If 7PM-7AM, please contact night-coverage [email or web address] Password [facility] 02/26/2016, 5:21 PM

## 2016-02-26 NOTE — Progress Notes (Signed)
[facility] Progress Note  Assessment/Plan: 1. Neutropenia/ fevers - Group b strep bacteremia - on ancef per pharmacy and primary - Tmax 102.5- fever curve did seem to be better but now not- FYI we can give ancef at OP dialysis; ID following symptoms suggested of disseminated dz; consider arthrocentesis and  TEE- per primary team  2. L sided weakness - felt to be due to pain/ MS origin. Neuro saw- rec getting MRI but she could not cooperate with exam or MRI - a non issue 3. HTN'sive urgency - resolved on amlodipine and hydralazine - actually now BPs low- suspect was not taking meds as should - now on no BP meds- is variable- likely goes up when pt agitated- does not need much UF with HD- not eating  4. ESRD - TTS HD-  via AVF  5. Vol -  titrate EDW down- post HD was 2 kg under EDW but maybe overshoot 6. Anemia - HGB now 10.3-->9.5 >9 Aranesp 60 resumed 5/25  7. Metabolic bone disease - Continue binders- fosrenol and phoslo, VDRA, sensipar- phos 6.6?- Corr Ca previously  higher adjustments made- is better 8. Nutrition - Albumin 2.7---> 1.9 . Renal diet/prostat/renal vit/ added nepro 9. Myeloma- recent chemoRx at [facility] 4-5 d ago- neutropenic- received stimulant - WBC climbing  10. MS- She can be emotionally labile at baseline. Calm and well oriented/articulate this am- seems that MS changes have resolved- do not think she needs work up for infectious neuro pathology  Corliss Parish, MD 02/26/2016     Subjective:   Temp to 102.5 last night. HD yest - removed 2500- had some low BP's overnight- given some IVF(250) BP better this AM Objective Filed Vitals:   02/26/16 0456 02/26/16 0505 02/26/16 0530 02/26/16 0715  BP: 164/99 145/79 101/62   Pulse:      Temp: 101.8 F (38.8 C)  102.5 F (39.2 C) 100.2 F (37.9 C)  TempSrc: Oral  Oral Oral  Resp:      Height:      Weight:   56.7 kg (125 lb)   SpO2:       Physical Exam General: NAD on HD; alert and oriented x  3 Heart: RRR w some ectopy2/6 murmur Lungs: no rales Abdomen: soft NT Extremities: left knee swollen, tender hurts with movement Dialysis Access: left upper AVF  Dialysis Orders: TTS SW 3h 46min 57kg 2/2.25 bath Hep none LUA AVF Aranesp 60 mcg IV weekly (last dose 02/16/16 HGB 11.1) Hectoral 4 mcg IV q treatment (Ca 7.7 C Ca 8.2 PTH 396 01/26/16 phos 5.7 02/09/16)   Additional Objective Labs: Basic Metabolic Panel:  Recent Labs Lab 02/23/16 0803 02/24/16 1205 02/25/16 0741  NA 128* 128* 125*  K 4.5 4.4 4.6  CL 92* 91* 89*  CO2 24 26 24   GLUCOSE 100* 92 98  BUN 62* 44* 66*  CREATININE 6.86* 4.98* 6.38*  CALCIUM 8.4* 9.4 8.9  PHOS 5.8* 6.3* 6.6*   Liver Function Tests:  Recent Labs Lab 02/23/16 0803 02/24/16 1205 02/25/16 0741  ALBUMIN 1.9* 1.9* 1.9*   CBC:  Recent Labs Lab 02/20/16 0431 02/21/16 0328 02/22/16 0400 02/23/16 0803 02/24/16 1206 02/25/16 0741  WBC 1.2* 2.1* 2.3* 3.1* 3.7* 4.5  NEUTROABS 0.3* 0.9* 1.3*  --   --   --   HGB 11.2* 10.5* 10.3* 9.6* 9.5* 9.0*  HCT 34.0* 32.3* 32.7* 30.3* 30.3* 28.1*  MCV 106.3* 105.9* 109.4* 109.0* 110.2* 108.9*  PLT 160 184 200 222 221 222  Blood Culture    Component Value Date/Time   SDES BLOOD RIGHT HAND 02/22/2016 1123   SPECREQUEST BOTTLES DRAWN AEROBIC ONLY [place] 02/22/2016 1123   CULT NO GROWTH 3 DAYS 02/22/2016 1123   REPTSTATUS PENDING 02/22/2016 1123   Medications:   . calcium acetate  667 mg Oral TID WC  .  ceFAZolin (ANCEF) IV  2 g Intravenous Q T,Th,Sa-HD  . cinacalcet  60 mg Oral Q breakfast  . darbepoetin (ARANESP) injection - DIALYSIS  60 mcg Intravenous Q Thu-HD  . [START ON 02/28/2016] doxercalciferol  3 mcg Intravenous Q T,Th,Sa-HD  . famotidine  10 mg Oral Daily  . feeding supplement (NEPRO CARB STEADY)  237 mL Oral BID BM  . feeding supplement (PRO-STAT SUGAR FREE 64)  30 mL Oral BID  . isosorbide mononitrate  60 mg Oral Daily  . lanthanum  2,000 mg Oral TID WC  . mirtazapine   15 mg Oral QHS  . multivitamin  1 tablet Oral QHS  . pantoprazole  40 mg Oral Daily  . sodium chloride flush  3 mL Intravenous Q12H  . sodium chloride flush  3 mL Intravenous Q12H  . valACYclovir  500 mg Oral Q48H

## 2016-02-26 NOTE — Progress Notes (Addendum)
Received orders for a 250cc bolus to be given over an hour. BP after bolus is 87/52. The client stated "I feel much better." I will continue to monitor the client closely and will recheck VS.  Addendum: Follow up BP is 92/62. The client is resting and states "I feel good." I will continue to monitor the client closely.

## 2016-02-26 NOTE — Anesthesia Preprocedure Evaluation (Addendum)
Anesthesia Evaluation  Patient identified by MRN, date of birth, ID band Patient awake    Reviewed: Allergy & Precautions, NPO status , Patient's Chart, lab work & pertinent test results  History of Anesthesia Complications Negative for: history of anesthetic complications  Airway Mallampati: I       Dental  (+) Poor Dentition, Dental Advisory Given   Pulmonary neg pulmonary ROS,    breath sounds clear to auscultation       Cardiovascular hypertension, Pt. on medications (-) angina Rhythm:Regular Rate:Normal  02/25/16 ECHO: EF 65-70%, valves OK   Neuro/Psych negative neurological ROS     GI/Hepatic Neg liver ROS, GERD  Medicated and Controlled,  Endo/Other  negative endocrine ROS  Renal/GU ESRF and DialysisRenal disease (K+ 5.0 today, [facility] dialysis)     Musculoskeletal   Abdominal   Peds  Hematology  (+) Blood dyscrasia (Hb 9.3), , Multiple myeloma   Anesthesia Other Findings   Reproductive/Obstetrics                           Anesthesia Physical Anesthesia Plan  ASA: III  Anesthesia Plan: General   Post-op Pain Management:    Induction: Intravenous  Airway Management Planned: LMA  Additional Equipment:   Intra-op Plan:   Post-operative Plan:   Informed Consent: I have reviewed the patients History and Physical, chart, labs and discussed the procedure including the risks, benefits and alternatives for the proposed anesthesia with the patient or authorized representative who has indicated his/her understanding and acceptance.   Dental advisory given  Plan Discussed with: CRNA and Surgeon  Anesthesia Plan Comments: (Plan routine monitors, GA- LMA OK)        Anesthesia Quick Evaluation

## 2016-02-26 NOTE — Progress Notes (Addendum)
The client was cold in her room so I checked her temp and she had a fever of 101.8. She then started to get nausea and vomited. I gave tylenol and Zofran. The clients BP was elevated during this time but is decreasing. I will continue to monitor closely and recheck VS.    Addendum: Recheck of VS: BP 101/62 and fever now up to 102.5. Paged Rogue Bussing got orders for more Tylenol. Will give and monitor client closely.

## 2016-02-26 NOTE — Progress Notes (Addendum)
 INFECTIOUS DISEASE PROGRESS NOTE  ID: [name] is a 75 y.o. female with  Active Problems:   ESRD (end stage renal disease) (HCC)   Multiple myeloma (HCC)   Hypertension associatd with end stage renal disease on dialysis   Hypertensive urgency   Swelling of joint of left knee   Weakness   Fever  Subjective: Fever this AM hypotension  Abtx:  Anti-infectives    Start     Dose/Rate Route Frequency Ordered Stop   02/23/16 1800  ceFAZolin (ANCEF) IVPB 2g/100 mL premix  Status:  Discontinued     2 g 200 mL/hr over 30 Minutes Intravenous Every T-Th-Sa (1800) 02/23/16 0847 02/23/16 1055   02/23/16 1200  ceFAZolin (ANCEF) IVPB 2g/100 mL premix     2 g 200 mL/hr over 30 Minutes Intravenous Every T-Th-Sa (Hemodialysis) 02/23/16 1055     02/22/16 1000  valACYclovir (VALTREX) tablet 500 mg     500 mg Oral Every 48 hours 02/21/16 0904     02/21/16 1200  ceFAZolin (ANCEF) IVPB 2g/100 mL premix  Status:  Discontinued     2 g 200 mL/hr over 30 Minutes Intravenous Every T-Th-Sa (Hemodialysis) 02/19/16 1122 02/23/16 0847   02/19/16 0800  ceFEPIme (MAXIPIME) 2 g in dextrose 5 % 50 mL IVPB  Status:  Discontinued     2 g 100 mL/hr over 30 Minutes Intravenous Every T-Th-Sa (1800) 02/19/16 0742 02/19/16 1122   02/18/16 1000  valACYclovir (VALTREX) tablet 500 mg  Status:  Discontinued     500 mg Oral Daily 02/18/16 0229 02/21/16 0904      Medications:  Scheduled: . calcium acetate  667 mg Oral TID WC  .  ceFAZolin (ANCEF) IV  2 g Intravenous Q T,Th,Sa-HD  . cinacalcet  60 mg Oral Q breakfast  . darbepoetin (ARANESP) injection - DIALYSIS  60 mcg Intravenous Q Thu-HD  . [START ON 02/28/2016] doxercalciferol  3 mcg Intravenous Q T,Th,Sa-HD  . famotidine  10 mg Oral Daily  . feeding supplement (NEPRO CARB STEADY)  237 mL Oral BID BM  . feeding supplement (PRO-STAT SUGAR FREE 64)  30 mL Oral BID  . isosorbide mononitrate  60 mg Oral Daily  . lanthanum  2,000 mg Oral TID WC  . mirtazapine   15 mg Oral QHS  . multivitamin  1 tablet Oral QHS  . pantoprazole  40 mg Oral Daily  . sodium chloride flush  3 mL Intravenous Q12H  . sodium chloride flush  3 mL Intravenous Q12H  . valACYclovir  500 mg Oral Q48H    Objective: Vital signs in last 24 hours: Temp:  [98.4 F (36.9 C)-102.5 F (39.2 C)] 98.7 F (37.1 C) (05/28 1116) Pulse Rate:  [68-71] 71 (05/28 1116) Resp:  [16-20] 16 (05/28 1116) BP: (83-164)/(42-99) 99/55 mmHg (05/28 1330) SpO2:  [97 %-100 %] 99 % (05/28 1116) Weight:  [56.7 kg (125 lb)] 56.7 kg (125 lb) (05/28 0530)   General appearance: no distress and awakens from sleep transiently Resp: clear to auscultation bilaterally Cardio: regular rate and rhythm GI: normal findings: bowel sounds normal and soft, non-tender  Lab Results  Recent Labs  02/24/16 1205  02/25/16 0741 02/26/16 1015  WBC  --   < > 4.5 6.1  HGB  --   < > 9.0* 9.3*  HCT  --   < > 28.1* 29.6*  NA 128*  --  125*  --   K 4.4  --  4.6  --   CL 91*  --    89*  --   CO2 26  --  24  --   BUN 44*  --  66*  --   CREATININE 4.98*  --  6.38*  --   < > = values in this interval not displayed. Liver Panel  Recent Labs  02/24/16 1205 02/25/16 0741  ALBUMIN 1.9* 1.9*   Sedimentation Rate No results for input(s): ESRSEDRATE in the last 72 hours. C-Reactive Protein No results for input(s): CRP in the last 72 hours.  Microbiology: Recent Results (from the past 240 hour(s))  Urine culture     Status: Abnormal   Collection Time: 02/18/16  2:45 AM  Result Value Ref Range Status   Specimen Description URINE, CLEAN CATCH  Final   Special Requests NONE  Final   Culture (A)  Final    >=100,000 COLONIES/mL GROUP B STREP(S.AGALACTIAE)ISOLATED TESTING AGAINST S. AGALACTIAE NOT ROUTINELY PERFORMED DUE TO PREDICTABILITY OF AMP/PEN/VAN SUSCEPTIBILITY.    Report Status 02/19/2016 FINAL  Final  MRSA PCR Screening     Status: None   Collection Time: 02/18/16  6:29 PM  Result Value Ref Range Status     MRSA by PCR NEGATIVE NEGATIVE Final    Comment:        The GeneXpert MRSA Assay (FDA approved for NASAL specimens only), is one component of a comprehensive MRSA colonization surveillance program. It is not intended to diagnose MRSA infection nor to guide or monitor treatment for MRSA infections.   Culture, blood (routine x 2)     Status: Abnormal   Collection Time: 02/18/16  9:40 PM  Result Value Ref Range Status   Specimen Description BLOOD HEMODIALYSIS CATHETER  Final   Special Requests BOTTLES DRAWN AEROBIC AND ANAEROBIC 10CC  Final   Culture  Setup Time   Final    GRAM POSITIVE COCCI IN CHAINS IN BOTH AEROBIC AND ANAEROBIC BOTTLES CRITICAL RESULT CALLED TO, READ BACK BY AND VERIFIED WITH: C DAVIS,RN AT 1144 02/19/16 BY L BENFIELD    Culture (A)  Final    GROUP B STREP(S.AGALACTIAE)ISOLATED SUSCEPTIBILITIES PERFORMED ON PREVIOUS CULTURE WITHIN THE LAST 5 DAYS.    Report Status 02/22/2016 FINAL  Final  Culture, blood (routine x 2)     Status: Abnormal   Collection Time: 02/18/16  9:50 PM  Result Value Ref Range Status   Specimen Description BLOOD HEMODIALYSIS CATHETER  Final   Special Requests BOTTLES DRAWN AEROBIC AND ANAEROBIC 10CC   Final   Culture  Setup Time   Final    GRAM POSITIVE COCCI IN CHAINS IN BOTH AEROBIC AND ANAEROBIC BOTTLES CRITICAL RESULT CALLED TO, READ BACK BY AND VERIFIED WITH: Beulah Gandy D AT 1102 02/19/16 BY L BENFIELD    Culture GROUP B STREP(S.AGALACTIAE)ISOLATED (A)  Final   Report Status 02/22/2016 FINAL  Final   Organism ID, Bacteria GROUP B STREP(S.AGALACTIAE)ISOLATED  Final      Susceptibility   Group b strep(s.agalactiae)isolated - MIC*    CLINDAMYCIN <=0.25 SENSITIVE Sensitive     AMPICILLIN <=0.25 SENSITIVE Sensitive     ERYTHROMYCIN <=0.12 SENSITIVE Sensitive     VANCOMYCIN 0.5 SENSITIVE Sensitive     CEFTRIAXONE <=0.12 SENSITIVE Sensitive     LEVOFLOXACIN 1 SENSITIVE Sensitive     * GROUP B STREP(S.AGALACTIAE)ISOLATED  Blood  Culture ID Panel (Reflexed)     Status: Abnormal   Collection Time: 02/18/16  9:50 PM  Result Value Ref Range Status   Enterococcus species NOT DETECTED NOT DETECTED Final   Vancomycin resistance NOT DETECTED NOT  DETECTED Final   Listeria monocytogenes NOT DETECTED NOT DETECTED Final   Staphylococcus species NOT DETECTED NOT DETECTED Final   Staphylococcus aureus NOT DETECTED NOT DETECTED Final   Methicillin resistance NOT DETECTED NOT DETECTED Final   Streptococcus species DETECTED (A) NOT DETECTED Final    Comment: CRITICAL RESULT CALLED TO, READ BACK BY AND VERIFIED WITH: M PHAM,PHARM D AT 1102 02/19/16 BY L BENFIELD    Streptococcus agalactiae DETECTED (A) NOT DETECTED Final    Comment: CRITICAL RESULT CALLED TO, READ BACK BY AND VERIFIED WITH: M PHAM,PHARM D AT 1102 02/19/16 BY L BENFIELD    Streptococcus pneumoniae NOT DETECTED NOT DETECTED Final   Streptococcus pyogenes NOT DETECTED NOT DETECTED Final   Acinetobacter baumannii NOT DETECTED NOT DETECTED Final   Enterobacteriaceae species NOT DETECTED NOT DETECTED Final   Enterobacter cloacae complex NOT DETECTED NOT DETECTED Final   Escherichia coli NOT DETECTED NOT DETECTED Final   Klebsiella oxytoca NOT DETECTED NOT DETECTED Final   Klebsiella pneumoniae NOT DETECTED NOT DETECTED Final   Proteus species NOT DETECTED NOT DETECTED Final   Serratia marcescens NOT DETECTED NOT DETECTED Final   Carbapenem resistance NOT DETECTED NOT DETECTED Final   Haemophilus influenzae NOT DETECTED NOT DETECTED Final   Neisseria meningitidis NOT DETECTED NOT DETECTED Final   Pseudomonas aeruginosa NOT DETECTED NOT DETECTED Final   Candida albicans NOT DETECTED NOT DETECTED Final   Candida glabrata NOT DETECTED NOT DETECTED Final   Candida krusei NOT DETECTED NOT DETECTED Final   Candida parapsilosis NOT DETECTED NOT DETECTED Final   Candida tropicalis NOT DETECTED NOT DETECTED Final  Culture, blood (Routine X 2) w Reflex to ID Panel      Status: None (Preliminary result)   Collection Time: 02/22/16 11:15 AM  Result Value Ref Range Status   Specimen Description BLOOD RIGHT ARM  Final   Special Requests IN PEDIATRIC BOTTLE 3CC  Final   Culture NO GROWTH 3 DAYS  Final   Report Status PENDING  Incomplete  Culture, blood (Routine X 2) w Reflex to ID Panel     Status: None (Preliminary result)   Collection Time: 02/22/16 11:23 AM  Result Value Ref Range Status   Specimen Description BLOOD RIGHT HAND  Final   Special Requests BOTTLES DRAWN AEROBIC ONLY 7CC  Final   Culture NO GROWTH 3 DAYS  Final   Report Status PENDING  Incomplete    Studies/Results: No results found.   Assessment/Plan: Group B strep bacteremia L shoulder and knee pain  consider TEE await fluid Cx from L knee She will need long term atbx (can be given at HD) Family deciding on LP  Multiple Myeloma  Total days of antibiotics: 7 (ancef)           Infectious Diseases (pager) [phone] [email or web address] 02/26/2016, 1:59 PM  LOS: 8 days      

## 2016-02-26 NOTE — Progress Notes (Signed)
Left knee aspiration performed with fluoroscopic guidance, sterile technique and an 18 gauge spinal needle.  30 cc of clear, reddish fluid was withdrawn and sent to the lab for testing. The patient tolerated the procedure well with no immediate complications.

## 2016-02-26 NOTE — Consult Note (Signed)
Reason for Consult:left knee possible infection Referring Physician: akula  [name] is an 75 y.o. female.  HPI: 75-year-old female with ESRD on TTS HD, multiple myeloma, HTN, GERD, anemia of chronic disease, malnutrition, presented to [facility] ED on 5/19 with complaints of left shoulder and knee pain which began after HD the day prior. Unable to clearly say if she was weak on that side. Also complained of nausea, vomiting and diarrhea. No acute issues as per OP dialysis staff. Neurology evaluated in ED and did not believe that she had a CVA. Markedly elevated blood pressure on arrival 171/151-briefly on Cardene drip which was weaned off when blood pressures improved. As per nephrology, history of behavioral issues and resident of SNF. X-rays of left shoulder and knee without fractures. Admitted for hypertensive urgency-resolved. Ruled in for group B strep bacteremia and UTI. At baseline seems to have some behavioral issues (daughter however denies) but fluctuating and somewhat worse in the hospital. Patient did not cooperate for MRI brain. Nephrology following.   Continued c/o left knee pain ortho consulted r/o infection.  Past Medical History  Diagnosis Date  . ESRD (end stage renal disease) (HCC) 03/11/2014  . Hypertension associatd with end stage renal disease on dialysis 03/11/2014  . GERD (gastroesophageal reflux disease) 04/23/2015  . Closed fracture of right distal femur (HCC) 09/01/2015  . Multiple myeloma (HCC) 03/11/2014  . Malnutrition of moderate degree 09/02/2015    Past Surgical History  Procedure Laterality Date  . Abdominal hysterectomy      Pt. denies    Family History  Problem Relation Age of Onset  . Hypertension Mother   . Hypertension Father     Social History:  reports that she has never smoked. She has never used smokeless tobacco. She reports that she does not drink alcohol or use illicit drugs.  Allergies: No Known Allergies  Medications: I have reviewed the  patient's current medications.  Results for orders placed or performed during the hospital encounter of 02/17/16 (from the past 48 hour(s))  CBC     Status: Abnormal   Collection Time: 02/25/16  7:41 AM  Result Value Ref Range   WBC 4.5 4.0 - 10.5 K/uL   RBC 2.58 (L) 3.87 - 5.11 MIL/uL   Hemoglobin 9.0 (L) 12.0 - 15.0 g/dL   HCT 28.1 (L) 36.0 - 46.0 %   MCV 108.9 (H) 78.0 - 100.0 fL   MCH 34.9 (H) 26.0 - 34.0 pg   MCHC 32.0 30.0 - 36.0 g/dL   RDW 14.5 11.5 - 15.5 %   Platelets 222 150 - 400 K/uL  Renal function panel     Status: Abnormal   Collection Time: 02/25/16  7:41 AM  Result Value Ref Range   Sodium 125 (L) 135 - 145 mmol/L   Potassium 4.6 3.5 - 5.1 mmol/L   Chloride 89 (L) 101 - 111 mmol/L   CO2 24 22 - 32 mmol/L   Glucose, Bld 98 65 - 99 mg/dL   BUN 66 (H) 6 - 20 mg/dL   Creatinine, Ser 6.38 (H) 0.44 - 1.00 mg/dL   Calcium 8.9 8.9 - 10.3 mg/dL   Phosphorus 6.6 (H) 2.5 - 4.6 mg/dL   Albumin 1.9 (L) 3.5 - 5.0 g/dL   GFR calc non Af Amer 6 (L) >60 mL/min   GFR calc Af Amer 7 (L) >60 mL/min    Comment: (NOTE) The eGFR has been calculated using the CKD EPI equation. This calculation has not been validated in all   clinical situations. eGFR's persistently <60 mL/min signify possible Chronic Kidney Disease.    Anion gap 12 5 - 15  Hepatitis B surface antigen     Status: None   Collection Time: 02/25/16  8:18 AM  Result Value Ref Range   Hepatitis B Surface Ag Negative Negative    Comment: (NOTE) called results to Kay Wollen @ 11:55am. APP Performed At: [facility] [address] [place], [place] [id number] H[place] William F MD Ph:[phone]   Hepatitis B surface antibody     Status: None   Collection Time: 02/25/16  8:18 AM  Result Value Ref Range   Hep B S Ab Non Reactive     Comment: (NOTE)              Non Reactive: Inconsistent with immunity,                            less than 10 mIU/mL              Reactive:     Consistent with immunity,                             greater than 9.9 mIU/mL Performed At: [facility] [place] [address] [place], [place] [id number] H[place] William F MD Ph:[phone]   Lactic acid, plasma     Status: None   Collection Time: 02/26/16 10:10 AM  Result Value Ref Range   Lactic Acid, Venous 1.9 0.5 - 2.0 mmol/L  CBC     Status: Abnormal   Collection Time: 02/26/16 10:15 AM  Result Value Ref Range   WBC 6.1 4.0 - 10.5 K/uL   RBC 2.69 (L) 3.87 - 5.11 MIL/uL   Hemoglobin 9.3 (L) 12.0 - 15.0 g/dL   HCT 29.6 (L) 36.0 - 46.0 %   MCV 110.0 (H) 78.0 - 100.0 fL   MCH 34.6 (H) 26.0 - 34.0 pg   MCHC 31.4 30.0 - 36.0 g/dL   RDW 14.5 11.5 - 15.5 %   Platelets 181 150 - 400 K/uL    No results found.  Review of Systems  Constitutional: Positive for chills. Negative for malaise/fatigue.  Eyes: Negative for blurred vision.  Respiratory: Negative for cough and shortness of breath.   Cardiovascular: Negative for chest pain and palpitations.  Gastrointestinal: Negative for nausea, vomiting and abdominal pain.  Genitourinary: Negative for dysuria, urgency and frequency.  Musculoskeletal: Positive for myalgias and joint pain.  Skin: Negative for rash.  Neurological: Negative for dizziness, loss of consciousness, weakness and headaches.  Endo/Heme/Allergies: Does not bruise/bleed easily.   Blood pressure 99/55, pulse 71, temperature 98.7 F (37.1 C), temperature source Oral, resp. rate 16, height 5' 7" (1.702 m), weight 56.7 kg (125 lb), SpO2 99 %. Physical Exam  Constitutional: She is oriented to person, place, and time.  HENT:  Head: Normocephalic and atraumatic.  Eyes: Conjunctivae and EOM are normal. Pupils are equal, round, and reactive to light.  Neck: Normal range of motion. Neck supple.  Cardiovascular: Normal rate and intact distal pulses.   Respiratory: Effort normal. No respiratory distress.  GI: Soft. She exhibits no distension. There is no tenderness.  Musculoskeletal:  Left shoulder pain  improving per pt Is able to actively move shoulder without significant pain, no swelling, warmth, or erythema  Left knee large effusion, warm, no erythema, significant pain with attempt at PROM, some medial TTP  Intact   DF/PF ankle, intact distal pulses, no calf TTP  Neurological: She is alert and oriented to person, place, and time.  Skin: Skin is warm and dry. No rash noted.  Psychiatric: She has a normal mood and affect. Her behavior is normal.    Assessment/Plan: Left knee possible septic arthritis  Patient had successful left knee aspiration with interventional radiology today will await lab results.  If showing signs of infection plan would be for OR tomorrow with Dr. Edmonia Lynch for I&D/arthroscopic lavage.  Discussed this with the patient, will make NPO after midnight tonight for possible OR tomorrow.  Chriss Czar 02/26/2016, 1:33 PM

## 2016-02-27 ENCOUNTER — Encounter (HOSPITAL_COMMUNITY): Payer: Self-pay | Admitting: Certified Registered Nurse Anesthetist

## 2016-02-27 ENCOUNTER — Inpatient Hospital Stay (HOSPITAL_COMMUNITY): Payer: Medicare Other | Admitting: Anesthesiology

## 2016-02-27 ENCOUNTER — Encounter (HOSPITAL_COMMUNITY)
Admission: EM | Disposition: A | Discharge status: Skilled Nursing Facility | Payer: Self-pay | Source: Home / Self Care | Attending: Internal Medicine

## 2016-02-27 HISTORY — PX: I & D EXTREMITY: SHX5045

## 2016-02-27 LAB — CULTURE, BLOOD (ROUTINE X 2)
Culture: NO GROWTH
Culture: NO GROWTH

## 2016-02-27 SURGERY — IRRIGATION AND DEBRIDEMENT EXTREMITY
Anesthesia: General | Site: Knee | Laterality: Left

## 2016-02-27 MED ORDER — FENTANYL CITRATE (PF) 100 MCG/2ML IJ SOLN
INTRAMUSCULAR | Status: DC | PRN
  Administered 2016-02-27 (×2): 50 ug via INTRAVENOUS

## 2016-02-27 MED ORDER — SUCCINYLCHOLINE CHLORIDE 200 MG/10ML IV SOSY
PREFILLED_SYRINGE | INTRAVENOUS | Status: DC | PRN
  Administered 2016-02-27: 80 mg via INTRAVENOUS

## 2016-02-27 MED ORDER — LIDOCAINE 2% (20 MG/ML) 5 ML SYRINGE
INTRAMUSCULAR | Status: DC | PRN
  Administered 2016-02-27: 20 mg via INTRAVENOUS

## 2016-02-27 MED ORDER — FENTANYL CITRATE (PF) 250 MCG/5ML IJ SOLN
INTRAMUSCULAR | Status: AC
  Filled 2016-02-27: qty 5

## 2016-02-27 MED ORDER — FENTANYL CITRATE (PF) 100 MCG/2ML IJ SOLN
INTRAMUSCULAR | Status: AC
  Administered 2016-02-27: 25 ug via INTRAVENOUS
  Filled 2016-02-27: qty 2

## 2016-02-27 MED ORDER — PROPOFOL 10 MG/ML IV BOLUS
INTRAVENOUS | Status: DC | PRN
  Administered 2016-02-27 (×2): 50 mg via INTRAVENOUS
  Administered 2016-02-27: 100 mg via INTRAVENOUS

## 2016-02-27 MED ORDER — MORPHINE SULFATE (PF) 2 MG/ML IV SOLN
1.0000 mg | INTRAVENOUS | Status: DC | PRN
  Administered 2016-02-27 – 2016-02-29 (×5): 2 mg via INTRAVENOUS
  Filled 2016-02-27 (×5): qty 1

## 2016-02-27 MED ORDER — CEFAZOLIN SODIUM-DEXTROSE 2-4 GM/100ML-% IV SOLN: 2.0000 g | Freq: Four times a day (QID) | INTRAVENOUS | Status: DC

## 2016-02-27 MED ORDER — PROPOFOL 10 MG/ML IV BOLUS
INTRAVENOUS | Status: AC
  Filled 2016-02-27: qty 20

## 2016-02-27 MED ORDER — FENTANYL CITRATE (PF) 100 MCG/2ML IJ SOLN
25.0000 ug | INTRAMUSCULAR | Status: DC | PRN
  Administered 2016-02-27 (×2): 25 ug via INTRAVENOUS
  Administered 2016-02-27: 50 ug via INTRAVENOUS

## 2016-02-27 MED ORDER — SODIUM CHLORIDE 0.9 % IR SOLN
Status: DC | PRN
  Administered 2016-02-27: 3000 mL

## 2016-02-27 MED ORDER — CEFAZOLIN SODIUM 1 G IJ SOLR
INTRAMUSCULAR | Status: DC | PRN
  Administered 2016-02-27: 2 g via INTRAMUSCULAR

## 2016-02-27 MED ORDER — MORPHINE SULFATE (PF) 2 MG/ML IV SOLN
2.0000 mg | Freq: Once | INTRAVENOUS | Status: AC
  Administered 2016-02-27: 2 mg via INTRAVENOUS
  Filled 2016-02-27: qty 1

## 2016-02-27 MED ORDER — PHENYLEPHRINE 40 MCG/ML (10ML) SYRINGE FOR IV PUSH (FOR BLOOD PRESSURE SUPPORT)
PREFILLED_SYRINGE | INTRAVENOUS | Status: DC | PRN
  Administered 2016-02-27 (×3): 40 ug via INTRAVENOUS

## 2016-02-27 MED ORDER — ONDANSETRON HCL 4 MG/2ML IJ SOLN
INTRAMUSCULAR | Status: DC | PRN
  Administered 2016-02-27: 4 mg via INTRAVENOUS

## 2016-02-27 SURGICAL SUPPLY — 37 items
BANDAGE ELASTIC 4 VELCRO ST LF (GAUZE/BANDAGES/DRESSINGS) ×3 IMPLANT
BANDAGE ELASTIC 6 VELCRO ST LF (GAUZE/BANDAGES/DRESSINGS) ×3 IMPLANT
BLADE SURG 10 STRL SS (BLADE) ×3 IMPLANT
BNDG COHESIVE 4X5 TAN STRL (GAUZE/BANDAGES/DRESSINGS) ×3 IMPLANT
BNDG GAUZE ELAST 4 BULKY (GAUZE/BANDAGES/DRESSINGS) ×3 IMPLANT
COVER SURGICAL LIGHT HANDLE (MISCELLANEOUS) ×3 IMPLANT
CUFF TOURNIQUET SINGLE 34IN LL (TOURNIQUET CUFF) IMPLANT
DRSG EMULSION OIL 3X3 NADH (GAUZE/BANDAGES/DRESSINGS) ×3 IMPLANT
DURAPREP 26ML APPLICATOR (WOUND CARE) ×3 IMPLANT
EVACUATOR 1/8 PVC DRAIN (DRAIN) IMPLANT
FACESHIELD WRAPAROUND (MASK) ×9 IMPLANT
GAUZE SPONGE 4X4 12PLY STRL (GAUZE/BANDAGES/DRESSINGS) ×6 IMPLANT
GAUZE SPONGE 4X4 16PLY XRAY LF (GAUZE/BANDAGES/DRESSINGS) ×3 IMPLANT
GAUZE XEROFORM 1X8 LF (GAUZE/BANDAGES/DRESSINGS) ×3 IMPLANT
GLOVE BIO SURGEON STRL SZ7 (GLOVE) ×3 IMPLANT
GLOVE BIO SURGEON STRL SZ7.5 (GLOVE) ×3 IMPLANT
GLOVE BIOGEL PI IND STRL 7.0 (GLOVE) ×1 IMPLANT
GLOVE BIOGEL PI INDICATOR 7.0 (GLOVE) ×2
GOWN STRL REUS W/ TWL LRG LVL3 (GOWN DISPOSABLE) ×2 IMPLANT
GOWN STRL REUS W/TWL LRG LVL3 (GOWN DISPOSABLE) ×4
KIT BASIN OR (CUSTOM PROCEDURE TRAY) ×3 IMPLANT
KIT ROOM TURNOVER OR (KITS) ×3 IMPLANT
MANIFOLD NEPTUNE II (INSTRUMENTS) ×3 IMPLANT
NS IRRIG 1000ML POUR BTL (IV SOLUTION) ×3 IMPLANT
PACK ORTHO EXTREMITY (CUSTOM PROCEDURE TRAY) ×3 IMPLANT
PAD ARMBOARD 7.5X6 YLW CONV (MISCELLANEOUS) ×6 IMPLANT
SPONGE LAP 18X18 X RAY DECT (DISPOSABLE) ×3 IMPLANT
STOCKINETTE IMPERVIOUS 9X36 MD (GAUZE/BANDAGES/DRESSINGS) ×3 IMPLANT
SUT ETHILON 3 0 PS 1 (SUTURE) ×6 IMPLANT
TOWEL OR 17X24 6PK STRL BLUE (TOWEL DISPOSABLE) ×3 IMPLANT
TOWEL OR 17X26 10 PK STRL BLUE (TOWEL DISPOSABLE) ×3 IMPLANT
TOWEL OR NON WOVEN STRL DISP B (DISPOSABLE) ×3 IMPLANT
TUBE CONNECTING 12'X1/4 (SUCTIONS) ×1
TUBE CONNECTING 12X1/4 (SUCTIONS) ×2 IMPLANT
UNDERPAD 30X30 INCONTINENT (UNDERPADS AND DIAPERS) ×3 IMPLANT
WATER STERILE IRR 1000ML POUR (IV SOLUTION) ×3 IMPLANT
YANKAUER SUCT BULB TIP NO VENT (SUCTIONS) ×3 IMPLANT

## 2016-02-27 NOTE — Progress Notes (Signed)
[facility] Progress Note  Assessment/Plan: 1. Group b strep bacteremia - on ancef per pharmacy and primary - Tmax 100.2- fever curve improving We can give ancef at OP dialysis; s/p I and D of left septic knee this am; ID following - consider TEE  2. L sided weakness - felt to be due to pain/ MS origin. Neuro saw- rec getting MRI but she could not cooperate with exam or MRI - a non issue 3. HTN'sive urgency - resolved on amlodipine and hydralazine -variable; suspect was not taking meds as should - now on no BP meds;likely goes up when pt agitated- does not need much UF with HD- not eating  4. ESRD - TTS HD- via AVF  5. Vol - titrate EDW down- post HD was 2 kg under EDW but maybe overshoot- proably too much pain to stand 6. Anemia - HGB now 10.3-->9.5 >9.3 Aranesp 60 resumed 5/25  7. Metabolic bone disease - Continue binders- fosrenol and phoslo, VDRA, sensipar- phos 6.6- Corr Ca previously higher adjustments made- is better 8. Nutrition - Albumin 2.7---> 1.9 . Renal diet/prostat/renal vit/ added nepro 9. Relapsed and refractory multiple yeloma- recent chemoRx at [facility] 4-5 d ago- neutropenic- received stimulant - WBC climbing  10. MS- She can be emotionally labile at baseline. Calm and well oriented/articulate this am- seems that MS changes have resolved- do not think she needs work up for infectious neuro pathology 11. Disp - SNF for d/c ; not willing to consider palliative care consult per PCP note 5/29   Myriam Jacobson, PA-C [facility] Beeper [phone] 02/27/2016,1:54 PM  LOS: 9 days   Pt seen, examined and agree w A/P as above. Had I&D of septic knee joint today , in a lot of pain.  For HD tomorrow.  Have ordered some IV pain medication prn for post op pain.  Kelly Splinter MD [facility] pager [phone]    cell [phone] 02/27/2016, 2:26 PM    Subjective:   Shoulder pain better. Only pain is left knee.  Can't get  comfortable  Objective Filed Vitals:   02/27/16 1300 02/27/16 1303 02/27/16 1310 02/27/16 1331  BP:  139/87  139/87  Pulse: 71 71 73 72  Temp:   97.9 F (36.6 C) 97.9 F (36.6 C)  TempSrc:    Oral  Resp: 15 10 26 22   Height:      Weight:      SpO2: 100% 100% 100% 100%   Physical Exam General: NAD but uncomfortable; MS baseline post op Heart: RRR with ectopy Lungs: no rales Abdomen: soft NT Extremities: no LE edema left knee wrapped; right LE w/SCDs Dialysis Access: left upper AVF + bruit  Dialysis Orders: TTS SW 3h 57min 57kg 2/2.25 bath Hep none LUA AVF Aranesp 60 mcg IV weekly (last dose 02/16/16 HGB 11.1) Hectoral 4 mcg IV q treatment (Ca 7.7 C Ca 8.2 PTH 396 01/26/16 phos 5.7 02/09/16)  Additional Objective Labs: Basic Metabolic Panel:  Recent Labs Lab 02/23/16 0803 02/24/16 1205 02/25/16 0741  NA 128* 128* 125*  K 4.5 4.4 4.6  CL 92* 91* 89*  CO2 24 26 24   GLUCOSE 100* 92 98  BUN 62* 44* 66*  CREATININE 6.86* 4.98* 6.38*  CALCIUM 8.4* 9.4 8.9  PHOS 5.8* 6.3* 6.6*   Liver Function Tests:  Recent Labs Lab 02/23/16 0803 02/24/16 1205 02/25/16 0741  ALBUMIN 1.9* 1.9* 1.9*   CBC:  Recent Labs Lab 02/21/16 0328 02/22/16 0400  02/23/16 0803 02/24/16 1206 02/25/16 0741 02/26/16 1015  WBC 2.1* 2.3* 3.1* 3.7* 4.5 6.1  NEUTROABS 0.9* 1.3*  --   --   --   --   HGB 10.5* 10.3* 9.6* 9.5* 9.0* 9.3*  HCT 32.3* 32.7* 30.3* 30.3* 28.1* 29.6*  MCV 105.9* 109.4* 109.0* 110.2* 108.9* 110.0*  PLT 184 200 222 221 222 181   Blood Culture    Component Value Date/Time   SDES FLUID KNEE LEFT 02/26/2016 1340   SPECREQUEST Immunocompromised 02/26/2016 1340   CULT NO GROWTH 4 DAYS 02/22/2016 1123   REPTSTATUS 02/26/2016 FINAL 02/26/2016 1340    Studies/Results: Dg Fluoro Guided Needle Plc Aspiration/injection Loc  02/26/2016  CLINICAL DATA:  Left knee pain, swelling and increased warmth. Moderate-sized left knee joint effusion on recent radiographs.  Sepsis. Clinical concern for infected knee. EXAM: FLUORO GUIDED NEEDLE PLACEMENT CONTRAST:  None used. FLUOROSCOPY TIME:  Radiation Exposure Index (as provided by the fluoroscopic device): Not applicable If the device does not provide the exposure index: Fluoroscopy Time (in minutes and seconds):  0 minutes 1 second Number of Acquired Images:  1 screen save COMPARISON:  Left knee radiographs dated 02/17/2016. FINDINGS: Using sterile technique, 1% lidocaine and fluoroscopic guidance, an 18 gauge spinal needle was inserted into the patellofemoral joint space using a lateral approach. 30 cc of clear, reddish fluid was withdrawn and sent to the laboratory for testing. The patient tolerated the procedure well with no immediate complications. IMPRESSION: Successful fluoroscopic guided left knee needle aspiration. Electronically Signed   By: Claudie Revering M.D.   On: 02/26/2016 14:02   Medications:   . calcium acetate  667 mg Oral TID WC  .  ceFAZolin (ANCEF) IV  2 g Intravenous Q T,Th,Sa-HD  . cinacalcet  60 mg Oral Q breakfast  . darbepoetin (ARANESP) injection - DIALYSIS  60 mcg Intravenous Q Thu-HD  . [START ON 02/28/2016] doxercalciferol  3 mcg Intravenous Q T,Th,Sa-HD  . famotidine  10 mg Oral Daily  . feeding supplement (NEPRO CARB STEADY)  237 mL Oral BID BM  . feeding supplement (PRO-STAT SUGAR FREE 64)  30 mL Oral BID  . isosorbide mononitrate  60 mg Oral Daily  . lanthanum  2,000 mg Oral TID WC  . mirtazapine  15 mg Oral QHS  . multivitamin  1 tablet Oral QHS  . pantoprazole  40 mg Oral Daily  . sodium chloride flush  3 mL Intravenous Q12H  . sodium chloride flush  3 mL Intravenous Q12H  . valACYclovir  500 mg Oral Q48H

## 2016-02-27 NOTE — Transfer of Care (Signed)
Immediate Anesthesia Transfer of Care Note  Patient: [name]  Procedure(s) Performed: Procedure(s): ARTHROSCOPIC IRRIGATION AND DEBRIDEMENT EXTREMITY (Left)  Patient Location: PACU  Anesthesia Type:General  Level of Consciousness: awake, alert , oriented and patient cooperative  Airway & Oxygen Therapy: Patient Spontanous Breathing and Patient connected to nasal cannula oxygen  Post-op Assessment: Report given to RN, Post -op Vital signs reviewed and stable and Patient moving all extremities X 4  Post vital signs: Reviewed and stable  Last Vitals:  Filed Vitals:   02/27/16 0900 02/27/16 1000  BP: 123/78 129/80  Pulse:    Temp:    Resp:      Last Pain:  Filed Vitals:   02/27/16 1005  PainSc: 0-No pain      Patients Stated Pain Goal: 0 ([id number])  Complications: No apparent anesthesia complications

## 2016-02-27 NOTE — Progress Notes (Signed)
Physical Therapy Treatment Patient Details Name: [name] MRN: [id number] DOB: 17-Aug-1941 Today's Date: 02/27/2016    History of Present Illness Pt is a 75 y/o F who presented to [facility] on 5/19 w/ c/o Lt shoulder and knee pain which began after HD the day prior.  Neurology evaluated in ED and did not believe that she had a CVA.  As per nephrology, history of behavioral issues and resident of SNF. X-rays of left shoulder and knee without fractures. Admitted for hypertensive urgency.  Pt's PMH includes ESRD, closed Rt distal femur fx, multiple myeloma, malnutrition.    PT Comments    Patient seen for therapeutic exercise and OOB to chair activity. Tolerated well despite increased pain in left knee. Performed exercise sitting in chair then transferred a second time back to bed (patient heading to OR). Overall patient tolerating some activity well and is hopefull to progress back to PLOF. Will continue to see and progress as tolerated.   Follow Up Recommendations  SNF;Supervision/Assistance - 24 hour     Equipment Recommendations  None recommended by PT    Recommendations for Other Services       Precautions / Restrictions Precautions Precautions: Fall Restrictions Weight Bearing Restrictions: No    Mobility  Bed Mobility Overal bed mobility: Needs Assistance Bed Mobility: Rolling;Sidelying to Sit;Sit to Supine Rolling: Mod assist;+2 for physical assistance Sidelying to sit: Mod assist;+2 for physical assistance   Sit to supine: Mod assist;+2 for physical assistance   General bed mobility comments: increased time, and assist to come to EOb secondary to anxiety and pain in [place]  Transfers Overall transfer level: Needs assistance Equipment used: 2 person hand held assist Transfers: Sit to/from [facility] Sit to Stand: Mod assist Stand pivot transfers: Mod assist       General transfer comment: Performed pivot to chair at beginning of  session, then return pivot from chair to bed at end of session as patient going to OR. Patient required moderate assist 2 persons to elevate to upright, and take picotal steps to chair, Increased Left knee pain with movement  Ambulation/Gait             General Gait Details: not tested   Stairs            Wheelchair Mobility    Modified Rankin (Stroke Patients Only)       Balance     Sitting balance-Leahy Scale: Fair Sitting balance - Comments: able to perform LE ther ex at EOB                            Cognition Arousal/Alertness: Awake/alert Behavior During Therapy: Anxious Overall Cognitive Status: No family/caregiver present to determine baseline cognitive functioning                      Exercises General Exercises - Lower Extremity Ankle Circles/Pumps: AROM;Both;10 reps Quad Sets: AROM;Both;10 reps;Supine Long Arc Quad: AROM;Both;10 reps Hip ABduction/ADduction: AROM;Both;10 reps Hip Flexion/Marching: AROM;Both;10 reps Toe Raises: AROM;Both;10 reps    General Comments        Pertinent Vitals/Pain Pain Assessment: Faces Faces Pain Scale: Hurts even more Pain Location: left knee Pain Descriptors / Indicators: Grimacing;Guarding;Moaning Pain Intervention(s): Monitored during session    Home Living                      Prior Function  PT Goals (current goals can now be found in the care plan section) Acute Rehab PT Goals Patient Stated Goal: to feel better PT Goal Formulation: With patient Time For Goal Achievement: 03/04/16 Potential to Achieve Goals: Fair Progress towards PT goals: Progressing toward goals    Frequency  Min 2X/week    PT Plan Current plan remains appropriate    Co-evaluation             End of Session Equipment Utilized During Treatment: Gait belt Activity Tolerance: Patient limited by pain;Patient limited by fatigue Patient left: in bed;with call bell/phone within  reach;with bed alarm set     Time: 0926-0950 PT Time Calculation (min) (ACUTE ONLY): 24 min  Charges:  $Therapeutic Exercise: 8-22 mins $Therapeutic Activity: 8-22 mins                    G CodesDuncan Dull 2016/03/26, 12:20 PM [name], [place] DPT  [phone]

## 2016-02-27 NOTE — Anesthesia Postprocedure Evaluation (Signed)
Anesthesia Post Note  Patient: Jacinta Gawrych  Procedure(s) Performed: Procedure(s) (LRB): ARTHROSCOPIC IRRIGATION AND DEBRIDEMENT EXTREMITY (Left)  Patient location during evaluation: PACU Anesthesia Type: General Level of consciousness: awake and alert, oriented and patient cooperative Pain management: pain level controlled Vital Signs Assessment: post-procedure vital signs reviewed and stable Respiratory status: spontaneous breathing, nonlabored ventilation and respiratory function stable Cardiovascular status: blood pressure returned to baseline and stable Postop Assessment: no signs of nausea or vomiting Anesthetic complications: no    Last Vitals:  Filed Vitals:   02/27/16 1245 02/27/16 1248  BP:  145/91  Pulse: 72 71  Temp:    Resp: 20 20    Last Pain:  Filed Vitals:   02/27/16 1300  PainSc: Asleep                 Tura Roller,E. Dijon Kohlman

## 2016-02-27 NOTE — Op Note (Signed)
02/17/2016 - 02/27/2016  12:40 PM  PATIENT:  [name]    PRE-OPERATIVE DIAGNOSIS:  LEFT SEPTIC KNEE JOINT  POST-OPERATIVE DIAGNOSIS:  Same  PROCEDURE:  ARTHROSCOPIC IRRIGATION AND DEBRIDEMENT EXTREMITY  SURGEON:  Horace Wishon D, MD  ASSISTANT: none  ANESTHESIA:   gen  PREOPERATIVE INDICATIONS:  [name] is a  75 y.o. female with a diagnosis of LEFT SEPTIC KNEE JOINT who failed conservative measures and elected for surgical management.    The risks benefits and alternatives were discussed with the patient preoperatively including but not limited to the risks of infection, bleeding, nerve injury, cardiopulmonary complications, the need for revision surgery, among others, and the patient was willing to proceed.  OPERATIVE IMPLANTS: none  OPERATIVE FINDINGS: Purulent fluid and severe arthritis pre-existing  BLOOD LOSS: min  COMPLICATIONS: none  TOURNIQUET TIME: none  OPERATIVE PROCEDURE:  Patient was identified in the preoperative holding area and site was marked by me She was transported to the operating theater and placed on the table in supine position taking care to pad all bony prominences. After a preincinduction time out anesthesia was induced. The left lower extremity was prepped and draped in normal sterile fashion and a pre-incision timeout was performed. She received ancef for preoperative antibiotics.   I made an inferior medial and lateral scopic portals and inserted the arthroscope. She had. Fluid within her knee. I performed a thorough lavage with 3 L of saline I debrided synovium using a shaver this is an excisional debridement I examined her joint spaces she had long-standing arthritis past grade 4 in her lateral compartment grade 2 and her medial compartment grade 3 and her tibia I'm sorry grade 3 and her patellofemoral.  She had extensively although performed a lateral meniscectomy. I debrided some of the remaining fibers.  After this complete excisional  debridement and irrigation I removed our scopic equipment expressed all fluid I closed her portals with simple stitches sterile dressings were applied she was taking the PACU in stable condition.  POST OPERATIVE PLAN: Weightbearing as tolerated DVT prophylaxis per the primary team    This note was generated using a template and dragon dictation system. In light of that, I have reviewed the note and all aspects of it are applicable to this case. Any dictation errors are due to the computerized dictation system.

## 2016-02-27 NOTE — Progress Notes (Signed)
PROGRESS NOTE  [name]  KYH:[id number] DOB: 01-27-1941  DOA: 02/17/2016 PCP: Benito Mccreedy, MD   Brief Narrative:  75 year old female with ESRD on TTS HD, multiple myeloma, HTN, GERD, anemia of chronic disease, malnutrition, presented to [facility] ED on 5/19 with complaints of left shoulder and knee pain which began after HD the day prior. Unable to clearly say if she was weak on that side. Also complained of nausea, vomiting and diarrhea. No acute issues as per OP dialysis staff. Neurology evaluated in ED and did not believe that she had a CVA. Markedly elevated blood pressure on arrival 171/151-briefly on Cardene drip which was weaned off when blood pressures improved. As per nephrology, history of behavioral issues and resident of SNF. X-rays of left shoulder and knee without fractures. Admitted for hypertensive urgency-resolved. Ruled in for group B strep bacteremia and UTI. At baseline seems to have some behavioral issues (daughter however denies) but fluctuating and somewhat worse in the hospital. Patient did not cooperate for MRI brain. Nephrology following.   Assessment & Plan:   Active Problems:   ESRD (end stage renal disease) ([place])   Multiple myeloma ([facility])   Hypertension associatd with end stage renal disease on dialysis   Hypertensive urgency   Swelling of joint of left knee   Weakness   Fever   Essential hypertension  Neutropenic fever/group B strep bacteremia and sepsis from UTI - On IV Ancef which is  given across HD. Duration to be determined prior to discharge. - Chest x-ray without acute findings.- Patient is immunocompromised from multiple myeloma and its treatment. - recurrent fever, repeat blood cultures ordered and they have been negative so far. Would continue with ancef. requested for ID consult for recurrent fevers.  - Plan for TEE, and arthrocentesis. Called cardiology for TEE on Tuesday. Will make her npo aftermidnight on Monday. - Radiology consulted for  arthrocentesis of the left knee and the fluid  sent for analysis. Orthopedics consulted and plan for knee washout today. - pt and family still thinking about the LP , will let us know .   Hypertensive urgency - Initial blood pressure 171/151. Treated briefly in the ED with Cardene drip and initiated her home oral antihypertensive regimen. Blood pressures improved and weaned off Cardene drip. -bp much better today.    B/L shoulders/knee pain and ?? Weakness - CT head without acute findings. No obvious focal deficits.  - X-rays of left shoulder without acute finding. X-ray of left knee shows joint effusion, probable loose body, tricompartmental osteoarthritis. Uric acid: Normal. No acute arthritis findings on physical exam. - Denies history of fall. - ? All related to osteoarthritis versus skeletal metastases from multiple myeloma. - Neurology consultation appreciated:Marland Kitchen However asymmetrical weakness felt to be more related to pain than stroke. Patient did not cooperate with MRI despite Ativan on 5/21. - Discussed with [name], oncology on 5/21 who indicated that patient has refractory multiple myeloma and has very poor prognosis. She recommended steroids for bony pain (however not started due to infection in immunosuppressed patients) or oxycodone (as per RN, patient refuses). She recommended palliative care consultation- discussed with daughter on 5/22 and she does not want to think about palliative care at this time.  - SNF on discharge.  ESRD on TTS HD/mild hyperkalemia - Nephrology follow-up appreciated. Hyperkalemia resolved. Status post HD TTS  Pancytopenia - Probably multifactorial related to multiple myeloma and ESRD.  - Anemia stable. Thrombocytopenia resolved. Received last chemotherapy and Granix on 5/17-WBC may get worse before  getting better. Follow daily CBC with differential. Improving WBC count.  Relapsed and refractory multiple myeloma - No obvious fractures on x-rays of  left shoulder and knee.  - Followed at [facility]. Reviewed last progress note from 02/01/16 in care everywhere: No signs or symptoms to suggest progression. Patient determined to continue aggressive treatment. Chemotherapy was held on that day due to [facility] of 0.3 and she has had issues with neutropenia and requires G-CSF support. She received bortezomib, granix, cyclophosphamide on 5/17 at [facility]. - Please see above regarding discussion with oncology  History of behavioral issues/confusion/altered mental status - Monitor. Current ongoing issues. - As per Dr. Moshe Cipro, Nephrology who is familiar with patient, on 5/22 patient seemed to be acting her normal but on 5/23 MS seemed worse. Etiology unclear. Her valtrex dose was reduced. Today she is alert , less confused.   Chronic hyponatremia - May be related to ESRD and multiple myeloma.decreased to 128 today. . Management per nephrology.     DVT prophylaxis: SCDs Code Status: Full Family Communication: Discussed with patient. Marland Kitchen Updated care and answered questions. Discussed with daughter on 5/27. Disposition Plan: DC to SNF when medically stable.   Consultants:   Nephrology  Neurology  Orthopedics.   Radiology  Infectious disease    Procedures:   HD  Arthrocentesis of the left knee  TEE.  Antimicrobials:   Valacyclovir 5/20 >  Cefepime 5/21 >5/23  Ancef 5/23  Subjective: She is alert and appears oriented.  Pain better controlled.    Objective:  Filed Vitals:   02/27/16 0530 02/27/16 0816 02/27/16 0900 02/27/16 1000  BP: 153/90 121/76 123/78 129/80  Pulse: 68 69    Temp: 98.6 F (37 C) 98.5 F (36.9 C)    TempSrc: Oral Oral    Resp:      Height:      Weight: 59.6 kg (131 lb 6.3 oz)     SpO2: 100% 95%      Intake/Output Summary (Last 24 hours) at 02/27/16 1012 Last data filed at 02/27/16 0816  Gross per 24 hour  Intake    603 ml  Output     75 ml  Net    528 ml   Filed Weights    02/25/16 1138 02/26/16 0530 02/27/16 0530  Weight: 55 kg (121 lb 4.1 oz) 56.7 kg (125 lb) 59.6 kg (131 lb 6.3 oz)    Examination:  General exam: Pleasant elderly female without any complaints.  Respiratory system: Clear to auscultation. Respiratory effort normal. Cardiovascular system: S1 & S2 heard, RRR. No JVD, murmurs, rubs, gallops or clicks. No pedal edema.  Gastrointestinal system: Abdomen is nondistended, soft and nontender. No organomegaly or masses felt. Normal bowel sounds heard. Central nervous system: Alert and oriented to place and person. No focal neurological deficits. Extremities: swelling of both knees, tender to touch. Skin: No rashes, lesions or ulcers Psychiatry: Judgement and insight appear poor. Mood & affect appropriate.     Data Reviewed: I have personally reviewed following labs and imaging studies  CBC:  Recent Labs Lab 02/21/16 0328 02/22/16 0400 02/23/16 0803 02/24/16 1206 02/25/16 0741 02/26/16 1015  WBC 2.1* 2.3* 3.1* 3.7* 4.5 6.1  NEUTROABS 0.9* 1.3*  --   --   --   --   HGB 10.5* 10.3* 9.6* 9.5* 9.0* 9.3*  HCT 32.3* 32.7* 30.3* 30.3* 28.1* 29.6*  MCV 105.9* 109.4* 109.0* 110.2* 108.9* 110.0*  PLT 184 200 222 221 222 903   Basic Metabolic  Panel:  Recent Labs Lab 02/21/16 1511 02/22/16 1005 02/23/16 0803 02/24/16 1205 02/25/16 0741  NA 130*  --  128* 128* 125*  K 5.5* 4.3 4.5 4.4 4.6  CL 92*  --  92* 91* 89*  CO2 23  --  '24 26 24  ' GLUCOSE 76  --  100* 92 98  BUN 70*  --  62* 44* 66*  CREATININE 8.59*  --  6.86* 4.98* 6.38*  CALCIUM 8.0*  --  8.4* 9.4 8.9  PHOS 7.2*  --  5.8* 6.3* 6.6*   GFR: Estimated Creatinine Clearance: 7.1 mL/min (by C-G formula based on Cr of 6.38). Liver Function Tests:  Recent Labs Lab 02/21/16 1511 02/23/16 0803 02/24/16 1205 02/25/16 0741  ALBUMIN 2.1* 1.9* 1.9* 1.9*   No results for input(s): LIPASE, AMYLASE in the last 168 hours. No results for input(s): AMMONIA in the last 168  hours. Coagulation Profile: No results for input(s): INR, PROTIME in the last 168 hours. Cardiac Enzymes: No results for input(s): CKTOTAL, CKMB, CKMBINDEX, TROPONINI in the last 168 hours. BNP (last 3 results) No results for input(s): PROBNP in the last 8760 hours. HbA1C: No results for input(s): HGBA1C in the last 72 hours. CBG: No results for input(s): GLUCAP in the last 168 hours. Lipid Profile: No results for input(s): CHOL, HDL, LDLCALC, TRIG, CHOLHDL, LDLDIRECT in the last 72 hours. Thyroid Function Tests: No results for input(s): TSH, T4TOTAL, FREET4, T3FREE, THYROIDAB in the last 72 hours. Anemia Panel: No results for input(s): VITAMINB12, FOLATE, FERRITIN, TIBC, IRON, RETICCTPCT in the last 72 hours.  Sepsis Labs:  Recent Labs Lab 02/26/16 1010  LATICACIDVEN 1.9    Recent Results (from the past 240 hour(s))  Urine culture     Status: Abnormal   Collection Time: 02/18/16  2:45 AM  Result Value Ref Range Status   Specimen Description URINE, CLEAN CATCH  Final   Special Requests NONE  Final   Culture (A)  Final    >=100,000 COLONIES/mL GROUP B STREP(S.AGALACTIAE)ISOLATED TESTING AGAINST S. AGALACTIAE NOT ROUTINELY PERFORMED DUE TO PREDICTABILITY OF AMP/PEN/VAN SUSCEPTIBILITY.    Report Status 02/19/2016 FINAL  Final  MRSA PCR Screening     Status: None   Collection Time: 02/18/16  6:29 PM  Result Value Ref Range Status   MRSA by PCR NEGATIVE NEGATIVE Final    Comment:        The GeneXpert MRSA Assay (FDA approved for NASAL specimens only), is one component of a comprehensive MRSA colonization surveillance program. It is not intended to diagnose MRSA infection nor to guide or monitor treatment for MRSA infections.   Culture, blood (routine x 2)     Status: Abnormal   Collection Time: 02/18/16  9:40 PM  Result Value Ref Range Status   Specimen Description BLOOD HEMODIALYSIS CATHETER  Final   Special Requests BOTTLES DRAWN AEROBIC AND ANAEROBIC 10CC  Final    Culture  Setup Time   Final    GRAM POSITIVE COCCI IN CHAINS IN BOTH AEROBIC AND ANAEROBIC BOTTLES CRITICAL RESULT CALLED TO, READ BACK BY AND VERIFIED WITH: C DAVIS,RN AT 1144 02/19/16 BY L BENFIELD    Culture (A)  Final    GROUP B STREP(S.AGALACTIAE)ISOLATED SUSCEPTIBILITIES PERFORMED ON PREVIOUS CULTURE WITHIN THE LAST 5 DAYS.    Report Status 02/22/2016 FINAL  Final  Culture, blood (routine x 2)     Status: Abnormal   Collection Time: 02/18/16  9:50 PM  Result Value Ref Range Status   Specimen Description BLOOD  HEMODIALYSIS CATHETER  Final   Special Requests BOTTLES DRAWN AEROBIC AND ANAEROBIC 10CC   Final   Culture  Setup Time   Final    GRAM POSITIVE COCCI IN CHAINS IN BOTH AEROBIC AND ANAEROBIC BOTTLES CRITICAL RESULT CALLED TO, READ BACK BY AND VERIFIED WITH: Beulah Gandy D AT 1102 02/19/16 BY L BENFIELD    Culture GROUP B STREP(S.AGALACTIAE)ISOLATED (A)  Final   Report Status 02/22/2016 FINAL  Final   Organism ID, Bacteria GROUP B STREP(S.AGALACTIAE)ISOLATED  Final      Susceptibility   Group b strep(s.agalactiae)isolated - MIC*    CLINDAMYCIN <=0.25 SENSITIVE Sensitive     AMPICILLIN <=0.25 SENSITIVE Sensitive     ERYTHROMYCIN <=0.12 SENSITIVE Sensitive     VANCOMYCIN 0.5 SENSITIVE Sensitive     CEFTRIAXONE <=0.12 SENSITIVE Sensitive     LEVOFLOXACIN 1 SENSITIVE Sensitive     * GROUP B STREP(S.AGALACTIAE)ISOLATED  Blood Culture ID Panel (Reflexed)     Status: Abnormal   Collection Time: 02/18/16  9:50 PM  Result Value Ref Range Status   Enterococcus species NOT DETECTED NOT DETECTED Final   Vancomycin resistance NOT DETECTED NOT DETECTED Final   Listeria monocytogenes NOT DETECTED NOT DETECTED Final   Staphylococcus species NOT DETECTED NOT DETECTED Final   Staphylococcus aureus NOT DETECTED NOT DETECTED Final   Methicillin resistance NOT DETECTED NOT DETECTED Final   Streptococcus species DETECTED (A) NOT DETECTED Final    Comment: CRITICAL RESULT CALLED TO, READ  BACK BY AND VERIFIED WITH: Beulah Gandy D AT 1102 02/19/16 BY L BENFIELD    Streptococcus agalactiae DETECTED (A) NOT DETECTED Final    Comment: CRITICAL RESULT CALLED TO, READ BACK BY AND VERIFIED WITH: Beulah Gandy D AT 1102 02/19/16 BY L BENFIELD    Streptococcus pneumoniae NOT DETECTED NOT DETECTED Final   Streptococcus pyogenes NOT DETECTED NOT DETECTED Final   Acinetobacter baumannii NOT DETECTED NOT DETECTED Final   Enterobacteriaceae species NOT DETECTED NOT DETECTED Final   Enterobacter cloacae complex NOT DETECTED NOT DETECTED Final   Escherichia coli NOT DETECTED NOT DETECTED Final   Klebsiella oxytoca NOT DETECTED NOT DETECTED Final   Klebsiella pneumoniae NOT DETECTED NOT DETECTED Final   Proteus species NOT DETECTED NOT DETECTED Final   Serratia marcescens NOT DETECTED NOT DETECTED Final   Carbapenem resistance NOT DETECTED NOT DETECTED Final   Haemophilus influenzae NOT DETECTED NOT DETECTED Final   Neisseria meningitidis NOT DETECTED NOT DETECTED Final   Pseudomonas aeruginosa NOT DETECTED NOT DETECTED Final   Candida albicans NOT DETECTED NOT DETECTED Final   Candida glabrata NOT DETECTED NOT DETECTED Final   Candida krusei NOT DETECTED NOT DETECTED Final   Candida parapsilosis NOT DETECTED NOT DETECTED Final   Candida tropicalis NOT DETECTED NOT DETECTED Final  Culture, blood (Routine X 2) w Reflex to ID Panel     Status: None (Preliminary result)   Collection Time: 02/22/16 11:15 AM  Result Value Ref Range Status   Specimen Description BLOOD RIGHT ARM  Final   Special Requests IN PEDIATRIC BOTTLE 3CC  Final   Culture NO GROWTH 4 DAYS  Final   Report Status PENDING  Incomplete  Culture, blood (Routine X 2) w Reflex to ID Panel     Status: None (Preliminary result)   Collection Time: 02/22/16 11:23 AM  Result Value Ref Range Status   Specimen Description BLOOD RIGHT HAND  Final   Special Requests BOTTLES DRAWN AEROBIC ONLY [place]  Final   Culture NO GROWTH 4 DAYS  Final   Report Status PENDING  Incomplete  Stat Gram stain     Status: None   Collection Time: 02/26/16  1:40 PM  Result Value Ref Range Status   Specimen Description FLUID KNEE LEFT  Final   Special Requests Immunocompromised  Final   Gram Stain   Final    ABUNDANT WBC PRESENT, PREDOMINANTLY PMN NO ORGANISMS SEEN    Report Status 02/26/2016 FINAL  Final         Radiology Studies: Dg Fluoro Guided Needle Plc Aspiration/injection Loc  02/26/2016  CLINICAL DATA:  Left knee pain, swelling and increased warmth. Moderate-sized left knee joint effusion on recent radiographs. Sepsis. Clinical concern for infected knee. EXAM: FLUORO GUIDED NEEDLE PLACEMENT CONTRAST:  None used. FLUOROSCOPY TIME:  Radiation Exposure Index (as provided by the fluoroscopic device): Not applicable If the device does not provide the exposure index: Fluoroscopy Time (in minutes and seconds):  0 minutes 1 second Number of Acquired Images:  1 screen save COMPARISON:  Left knee radiographs dated 02/17/2016. FINDINGS: Using sterile technique, 1% lidocaine and fluoroscopic guidance, an 18 gauge spinal needle was inserted into the patellofemoral joint space using a lateral approach. 30 cc of clear, reddish fluid was withdrawn and sent to the laboratory for testing. The patient tolerated the procedure well with no immediate complications. IMPRESSION: Successful fluoroscopic guided left knee needle aspiration. Electronically Signed   By: Claudie Revering M.D.   On: 02/26/2016 14:02        Scheduled Meds: . calcium acetate  667 mg Oral TID WC  .  ceFAZolin (ANCEF) IV  2 g Intravenous Q T,Th,Sa-HD  . cinacalcet  60 mg Oral Q breakfast  . darbepoetin (ARANESP) injection - DIALYSIS  60 mcg Intravenous Q Thu-HD  . [START ON 02/28/2016] doxercalciferol  3 mcg Intravenous Q T,Th,Sa-HD  . famotidine  10 mg Oral Daily  . feeding supplement (NEPRO CARB STEADY)  237 mL Oral BID BM  . feeding supplement (PRO-STAT SUGAR FREE 64)  30 mL  Oral BID  . isosorbide mononitrate  60 mg Oral Daily  . lanthanum  2,000 mg Oral TID WC  . mirtazapine  15 mg Oral QHS  . multivitamin  1 tablet Oral QHS  . pantoprazole  40 mg Oral Daily  . sodium chloride flush  3 mL Intravenous Q12H  . sodium chloride flush  3 mL Intravenous Q12H  . valACYclovir  500 mg Oral Q48H   Continuous Infusions:     LOS: 9 days    Time spent: 30 minutes.    Hosie Poisson, MD [facility] Pager [phone] If 7PM-7AM, please contact night-coverage [email or web address] Password [email or web address] 02/27/2016, 10:12 AM

## 2016-02-27 NOTE — Care Management Important Message (Signed)
Important Message  Patient Details  Name: [name] MRN: [id number] Date of Birth: [birth year]   Medicare Important Message Given:  Yes    Barb Merino Danella Philson 02/27/2016, 9:56 AM

## 2016-02-27 NOTE — Anesthesia Procedure Notes (Signed)
Procedure Name: Intubation Date/Time: 02/27/2016 11:29 AM Performed by: Bethel Born Pre-anesthesia Checklist: Patient identified, Emergency Drugs available, Suction available, Patient being monitored and Timeout performed Patient Re-evaluated:Patient Re-evaluated prior to inductionOxygen Delivery Method: Circle system utilized Preoxygenation: Pre-oxygenation with 100% oxygen Intubation Type: IV induction Ventilation: Mask ventilation without difficulty Laryngoscope Size: Miller and 2 Grade View: Grade I Tube type: Oral Tube size: 7.0 mm Number of attempts: 1 Airway Equipment and Method: Stylet Placement Confirmation: ETT inserted through vocal cords under direct vision,  positive ETCO2 and breath sounds checked- equal and bilateral Secured at: 22 cm Tube secured with: Tape Dental Injury: Teeth and Oropharynx as per pre-operative assessment  Comments: LMA 4 placed.  Unable to seat.  LMA removed and  DL x1 with Mil 2.  Grade 1 view.  EBBS and VSS

## 2016-02-27 NOTE — Progress Notes (Signed)
Initial Nutrition Assessment  DOCUMENTATION CODES:   Not applicable  INTERVENTION:    Continue Nepro and Prostat PO BID to maximize protein intake for wound healing.  NUTRITION DIAGNOSIS:   Increased nutrient needs related to wound healing as evidenced by estimated needs.  GOAL:   Patient will meet greater than or equal to 90% of their needs  MONITOR:   PO intake, Supplement acceptance, Labs, Weight trends, Skin, I & O's  REASON FOR ASSESSMENT:   Malnutrition Screening Tool    ASSESSMENT:   75 y.o. female with medical history significant of end-stage renal disease (HD on T, Th, Sat), relapsed and refractory IgG lambda multiple myeloma (currently receiving chemotherapy), right femur fracture, chronic leukopenia and anemia, GERD. She presented on 5/19 with left arm and left leg pain after HD, as well as N/V, diarrhea, and fever. Ruled in for group B strep bacteremia and UTI.   Per chart review, patient has behavioral issues that tend to be somewhat worse in the hospital. Unable to speak with patient or complete nutrition focused physical exam. She is currently in the OR for I&D of septic L knee. Per discussion with RN, she did not receive her supplements today because she was NPO this AM for procedure today. Patient will be able to have her supplements when she returns from the OR today.  Previously on a dysphagia 1 diet with thin liquids, consuming 50-100% of meals. Also receiving Nepro Shake and 30 ml Prostat PO BID.  Diet Order:  Diet NPO time specified DIET - DYS 1 Room service appropriate?: Yes; Fluid consistency:: Thin; Fluid restriction:: 1200 mL Fluid  Skin:  Wound (see comment) (sacral wound; septic L knee surgical incision)  Last BM:  5/27  Height:   Ht Readings from Last 1 Encounters:  02/17/16 _0  (1.702 m)    Weight:   Wt Readings from Last 1 Encounters:  02/27/16 131 lb 6.3 oz (59.6 kg)    Ideal Body Weight:  61.4 kg  BMI:  Body mass index is  20.57 kg/(m^2).  Estimated Nutritional Needs:   Kcal:  1800-2000  Protein:  100-110 gm  Fluid:  2 L  EDUCATION NEEDS:   No education needs identified at this time  Molli Barrows, [place], [place], [place] Pager [phone] After Hours Pager [phone]

## 2016-02-28 ENCOUNTER — Encounter (HOSPITAL_COMMUNITY): Payer: Self-pay | Admitting: Orthopedic Surgery

## 2016-02-28 DIAGNOSIS — R7881 Bacteremia: Secondary | ICD-10-CM | POA: Diagnosis present

## 2016-02-28 DIAGNOSIS — B951 Streptococcus, group B, as the cause of diseases classified elsewhere: Secondary | ICD-10-CM | POA: Diagnosis present

## 2016-02-28 LAB — BASIC METABOLIC PANEL
ANION GAP: 14 (ref 5–15)
BUN: 78 mg/dL — ABNORMAL HIGH (ref 6–20)
CALCIUM: 8.8 mg/dL — AB (ref 8.9–10.3)
CHLORIDE: 87 mmol/L — AB (ref 101–111)
CO2: 23 mmol/L (ref 22–32)
CREATININE: 6.98 mg/dL — AB (ref 0.44–1.00)
GFR calc Af Amer: 6 mL/min — ABNORMAL LOW (ref >60)
GFR calc non Af Amer: 5 mL/min — ABNORMAL LOW (ref >60)
GLUCOSE: 86 mg/dL (ref 65–99)
Potassium: 5.3 mmol/L — ABNORMAL HIGH (ref 3.5–5.1)
Sodium: 124 mmol/L — ABNORMAL LOW (ref 135–145)

## 2016-02-28 LAB — ACID FAST SMEAR (AFB, MYCOBACTERIA): Acid Fast Smear: NEGATIVE

## 2016-02-28 LAB — POCT I-STAT 4, (NA,K, GLUC, HGB,HCT)
GLUCOSE: 84 mg/dL (ref 65–99)
HCT: 34 % — ABNORMAL LOW (ref 36.0–46.0)
Hemoglobin: 11.6 g/dL — ABNORMAL LOW (ref 12.0–15.0)
POTASSIUM: 5 mmol/L (ref 3.5–5.1)
SODIUM: 126 mmol/L — AB (ref 135–145)

## 2016-02-28 LAB — CBC
HEMATOCRIT: 28.4 % — AB (ref 36.0–46.0)
HEMOGLOBIN: 8.9 g/dL — AB (ref 12.0–15.0)
MCH: 34.2 pg — ABNORMAL HIGH (ref 26.0–34.0)
MCHC: 31.3 g/dL (ref 30.0–36.0)
MCV: 109.2 fL — AB (ref 78.0–100.0)
Platelets: 216 10*3/uL (ref 150–400)
RBC: 2.6 MIL/uL — ABNORMAL LOW (ref 3.87–5.11)
RDW: 14.5 % (ref 11.5–15.5)
WBC: 5.4 10*3/uL (ref 4.0–10.5)

## 2016-02-28 MED ORDER — DOXERCALCIFEROL 4 MCG/2ML IV SOLN
INTRAVENOUS | Status: AC
  Filled 2016-02-28: qty 2

## 2016-02-28 MED ORDER — SODIUM CHLORIDE 0.9 % IV SOLN: 100.0000 mL | INTRAVENOUS | Status: DC | PRN

## 2016-02-28 MED ORDER — PENTAFLUOROPROP-TETRAFLUOROETH EX AERO: 1.0000 "application " | INHALATION_SPRAY | CUTANEOUS | Status: DC | PRN

## 2016-02-28 MED ORDER — SODIUM CHLORIDE 0.9 % IV SOLN: INTRAVENOUS | Status: DC

## 2016-02-28 MED ORDER — LIDOCAINE HCL (PF) 1 % IJ SOLN: 5.0000 mL | INTRAMUSCULAR | Status: DC | PRN

## 2016-02-28 MED ORDER — HEPARIN SODIUM (PORCINE) 1000 UNIT/ML DIALYSIS: 1000.0000 [IU] | INTRAMUSCULAR | Status: DC | PRN

## 2016-02-28 MED ORDER — LIDOCAINE-PRILOCAINE 2.5-2.5 % EX CREA: 1.0000 "application " | TOPICAL_CREAM | CUTANEOUS | Status: DC | PRN

## 2016-02-28 MED ORDER — ALTEPLASE 2 MG IJ SOLR: 2.0000 mg | Freq: Once | INTRAMUSCULAR | Status: DC | PRN

## 2016-02-28 NOTE — Clinical Social Work Note (Signed)
Met with patient to inform her that [facility] states [facility] will not be able to accept the patient back if a bed is not available at time of discharge. CSW made patient aware of other bed offers received. CSW attempted to reach daughter Dayanira Giovannetti Art at numbers listed. Messages left with CSW contact info requesting call back.   Liz Beach MSW, [place], [place], [phone]

## 2016-02-28 NOTE — Progress Notes (Signed)
PROGRESS NOTE  [name]  ZNB:[id number] DOB: 05-18-1941  DOA: 02/17/2016 PCP: Benito Mccreedy, MD   Brief Narrative:  75 year old female with ESRD on TTS HD, multiple myeloma, HTN, GERD, anemia of chronic disease, malnutrition, presented to [facility] ED on 5/19 with complaints of left shoulder and knee pain which began after HD the day prior. Unable to clearly say if she was weak on that side. Also complained of nausea, vomiting and diarrhea. No acute issues as per OP dialysis staff. Neurology evaluated in ED and did not believe that she had a CVA. Markedly elevated blood pressure on arrival 171/151-briefly on Cardene drip which was weaned off when blood pressures improved. As per nephrology, history of behavioral issues and resident of SNF. X-rays of left shoulder and knee without fractures. Admitted for hypertensive urgency-resolved. Ruled in for group B strep bacteremia and UTI. At baseline seems to have some behavioral issues (daughter however denies) but fluctuating and somewhat worse in the hospital. Patient did not cooperate for MRI brain.  Hospital course from 5/24 to 5/30: Pt continued to have fever,   last 24 hours has been tmax of 99.1.   ID consulted and ordered repeat blood cultures which have been negative so far.  TTE and TEE ordered for evaluation of endocarditis.  Left knee joint aspiration and sent for analysis and orthopedics consulted, underwent  arthroscopic irrigation and debridement on 5/29.  Cardiology requested for TEE, which is planned for 5/30 vs 6/1.  Her mental status improved , and she refused LP, which was recommended by ID for evaluation of HSV encephalitis in view of her immunocompromised state. Discussed with the daughter.   Assessment & Plan:   Active Problems:   ESRD (end stage renal disease) ([place])   Multiple myeloma ([facility])   Hypertension associatd with end stage renal disease on dialysis   Hypertensive urgency   Swelling of joint of left knee   Weakness  Fever   Essential hypertension  Neutropenic fever/group B strep bacteremia and sepsis from UTI - On IV Ancef which is  given across HD. Duration to be determined prior to discharge. - Chest x-ray without acute findings.- Patient is immunocompromised from multiple myeloma and its treatment. - recurrent fever, repeat blood cultures ordered and they have been negative so far. Would continue with ancef. requested for ID consult for recurrent fevers.  - Plan for TEE, and arthrocentesis.  - Radiology consulted for arthrocentesis of the left knee and the fluid  sent for analysis. Orthopedics consulted and plan for knee arthroscopic irrigation and debridement.  -  ID recommended LP for evaluation of HSV encephalitis in view of her immunocompromised state. Pt refused and discussed with the daughter. Currently LP on hold.   Hypertensive urgency - Initial blood pressure 171/151. Treated briefly in the ED with Cardene drip and initiated her home oral antihypertensive regimen. Blood pressures improved and weaned off Cardene drip. -bp much better today.    B/L shoulders/knee pain and ?? Weakness - CT head without acute findings. No obvious focal deficits.  - X-rays of left shoulder without acute finding. X-ray of left knee shows joint effusion, probable loose body, tricompartmental osteoarthritis. Uric acid: Normal. No acute arthritis findings on physical exam. - Denies history of fall. - ? All related to osteoarthritis versus skeletal metastases from multiple myeloma. - Neurology consultation appreciated:Marland Kitchen However asymmetrical weakness felt to be more related to pain than stroke. Patient did not cooperate with MRI despite Ativan on 5/21. - Discussed with [name], oncology on 5/21  who indicated that patient has refractory multiple myeloma and has very poor prognosis. She recommended steroids for bony pain (however not started due to infection in immunosuppressed patients) or oxycodone (as per RN, patient  refuses). She recommended palliative care consultation- discussed with daughter on 5/22 and she does not want to think about palliative care at this time.  - SNF on discharge.  ESRD on TTS HD/mild hyperkalemia - Nephrology follow-up appreciated. Hyperkalemia resolved. Status post HD TTS  Pancytopenia - Probably multifactorial related to multiple myeloma and ESRD.  - Anemia stable. Thrombocytopenia resolved. Received last chemotherapy and Granix on 5/17.  Follow daily CBC with differential. Improving WBC count.  Relapsed and refractory multiple myeloma - No obvious fractures on x-rays of left shoulder and knee.  - Followed at [facility]. Reviewed last progress note from 02/01/16 in care everywhere: No signs or symptoms to suggest progression. Patient determined to continue aggressive treatment. Chemotherapy was held on that day due to [facility] of 0.3 and she has had issues with neutropenia and requires G-CSF support. She received bortezomib, granix, cyclophosphamide on 5/17 at [facility]. - Please see above regarding discussion with oncology  History of behavioral issues/confusion/altered mental status - Monitor. Current ongoing issues. - As per Dr. Moshe Cipro, Nephrology who is familiar with patient, on 5/22 patient seemed to be acting her normal but on 5/23 MS seemed worse. Etiology unclear. Her valtrex dose was reduced. Today she is alert , less confused.   Chronic hyponatremia - May be related to ESRD and multiple myeloma.decreased to 128 today. . Management per nephrology.     DVT prophylaxis: SCDs+  Code Status: Full Family Communication: Discussed with patient. Marland Kitchen Updated care and answered questions. Discussed with daughter on 5/30. Disposition Plan: DC to SNF when medically stable.   Consultants:   Nephrology  Neurology  Orthopedics.   Radiology  Infectious disease    Procedures:   HD  Arthrocentesis of the left knee 5/28  Arthroscopic irrigation and  debridement 5/29  TEE. 5/30  Antimicrobials:   Valacyclovir 5/20 >  Cefepime 5/21 >5/23  Ancef 5/23  Subjective: She is alert and appears oriented.  Pain better controlled.    Objective:  Filed Vitals:   02/28/16 0930 02/28/16 1000 02/28/16 1030 02/28/16 1056  BP: 108/66 110/69 117/74   Pulse: 77 70 75   Temp:    98.4 F (36.9 C)  TempSrc:    Oral  Resp:    15  Height:      Weight:    56.1 kg (123 lb 10.9 oz)  SpO2:    100%    Intake/Output Summary (Last 24 hours) at 02/28/16 1102 Last data filed at 02/28/16 1056  Gross per 24 hour  Intake    490 ml  Output   2390 ml  Net  -1900 ml   Filed Weights   02/27/16 0530 02/28/16 0453 02/28/16 1056  Weight: 59.6 kg (131 lb 6.3 oz) 57.3 kg (126 lb 5.2 oz) 56.1 kg (123 lb 10.9 oz)    Examination:  General exam: Pleasant elderly female without any complaints.  Respiratory system: Clear to auscultation. Respiratory effort normal. Cardiovascular system: S1 & S2 heard, RRR. No JVD, murmurs, rubs, gallops or clicks. No pedal edema.  Gastrointestinal system: Abdomen is nondistended, soft and nontender. No organomegaly or masses felt. Normal bowel sounds heard. Central nervous system: Alert and oriented to place and person. No focal neurological deficits. Extremities: swelling of both knees, tender to touch. Skin: No rashes,  lesions or ulcers Psychiatry: Judgement and insight appear poor. Mood & affect appropriate.     Data Reviewed: I have personally reviewed following labs and imaging studies  CBC:  Recent Labs Lab 02/22/16 0400 02/23/16 0803 02/24/16 1206 02/25/16 0741 02/26/16 1015 02/28/16 0515  WBC 2.3* 3.1* 3.7* 4.5 6.1 5.4  NEUTROABS 1.3*  --   --   --   --   --   HGB 10.3* 9.6* 9.5* 9.0* 9.3* 8.9*  HCT 32.7* 30.3* 30.3* 28.1* 29.6* 28.4*  MCV 109.4* 109.0* 110.2* 108.9* 110.0* 109.2*  PLT 200 222 221 222 181 191   Basic Metabolic Panel:  Recent Labs Lab 02/21/16 1511 02/22/16 1005  02/23/16 0803 02/24/16 1205 02/25/16 0741 02/28/16 0515  NA 130*  --  128* 128* 125* 124*  K 5.5* 4.3 4.5 4.4 4.6 5.3*  CL 92*  --  92* 91* 89* 87*  CO2 23  --  '24 26 24 23  ' GLUCOSE 76  --  100* 92 98 86  BUN 70*  --  62* 44* 66* 78*  CREATININE 8.59*  --  6.86* 4.98* 6.38* 6.98*  CALCIUM 8.0*  --  8.4* 9.4 8.9 8.8*  PHOS 7.2*  --  5.8* 6.3* 6.6*  --    GFR: Estimated Creatinine Clearance: 6.1 mL/min (by C-G formula based on Cr of 6.98). Liver Function Tests:  Recent Labs Lab 02/21/16 1511 02/23/16 0803 02/24/16 1205 02/25/16 0741  ALBUMIN 2.1* 1.9* 1.9* 1.9*   No results for input(s): LIPASE, AMYLASE in the last 168 hours. No results for input(s): AMMONIA in the last 168 hours. Coagulation Profile: No results for input(s): INR, PROTIME in the last 168 hours. Cardiac Enzymes: No results for input(s): CKTOTAL, CKMB, CKMBINDEX, TROPONINI in the last 168 hours. BNP (last 3 results) No results for input(s): PROBNP in the last 8760 hours. HbA1C: No results for input(s): HGBA1C in the last 72 hours. CBG: No results for input(s): GLUCAP in the last 168 hours. Lipid Profile: No results for input(s): CHOL, HDL, LDLCALC, TRIG, CHOLHDL, LDLDIRECT in the last 72 hours. Thyroid Function Tests: No results for input(s): TSH, T4TOTAL, FREET4, T3FREE, THYROIDAB in the last 72 hours. Anemia Panel: No results for input(s): VITAMINB12, FOLATE, FERRITIN, TIBC, IRON, RETICCTPCT in the last 72 hours.  Sepsis Labs:  Recent Labs Lab 02/26/16 1010  LATICACIDVEN 1.9    Recent Results (from the past 240 hour(s))  MRSA PCR Screening     Status: None   Collection Time: 02/18/16  6:29 PM  Result Value Ref Range Status   MRSA by PCR NEGATIVE NEGATIVE Final    Comment:        The GeneXpert MRSA Assay (FDA approved for NASAL specimens only), is one component of a comprehensive MRSA colonization surveillance program. It is not intended to diagnose MRSA infection nor to guide  or monitor treatment for MRSA infections.   Culture, blood (routine x 2)     Status: Abnormal   Collection Time: 02/18/16  9:40 PM  Result Value Ref Range Status   Specimen Description BLOOD HEMODIALYSIS CATHETER  Final   Special Requests BOTTLES DRAWN AEROBIC AND ANAEROBIC 10CC  Final   Culture  Setup Time   Final    GRAM POSITIVE COCCI IN CHAINS IN BOTH AEROBIC AND ANAEROBIC BOTTLES CRITICAL RESULT CALLED TO, READ BACK BY AND VERIFIED WITH: C DAVIS,RN AT 1144 02/19/16 BY L BENFIELD    Culture (A)  Final    GROUP B STREP(S.AGALACTIAE)ISOLATED SUSCEPTIBILITIES PERFORMED ON PREVIOUS  CULTURE WITHIN THE LAST 5 DAYS.    Report Status 02/22/2016 FINAL  Final  Culture, blood (routine x 2)     Status: Abnormal   Collection Time: 02/18/16  9:50 PM  Result Value Ref Range Status   Specimen Description BLOOD HEMODIALYSIS CATHETER  Final   Special Requests BOTTLES DRAWN AEROBIC AND ANAEROBIC 10CC   Final   Culture  Setup Time   Final    GRAM POSITIVE COCCI IN CHAINS IN BOTH AEROBIC AND ANAEROBIC BOTTLES CRITICAL RESULT CALLED TO, READ BACK BY AND VERIFIED WITH: Beulah Gandy D AT 1102 02/19/16 BY L BENFIELD    Culture GROUP B STREP(S.AGALACTIAE)ISOLATED (A)  Final   Report Status 02/22/2016 FINAL  Final   Organism ID, Bacteria GROUP B STREP(S.AGALACTIAE)ISOLATED  Final      Susceptibility   Group b strep(s.agalactiae)isolated - MIC*    CLINDAMYCIN <=0.25 SENSITIVE Sensitive     AMPICILLIN <=0.25 SENSITIVE Sensitive     ERYTHROMYCIN <=0.12 SENSITIVE Sensitive     VANCOMYCIN 0.5 SENSITIVE Sensitive     CEFTRIAXONE <=0.12 SENSITIVE Sensitive     LEVOFLOXACIN 1 SENSITIVE Sensitive     * GROUP B STREP(S.AGALACTIAE)ISOLATED  Blood Culture ID Panel (Reflexed)     Status: Abnormal   Collection Time: 02/18/16  9:50 PM  Result Value Ref Range Status   Enterococcus species NOT DETECTED NOT DETECTED Final   Vancomycin resistance NOT DETECTED NOT DETECTED Final   Listeria monocytogenes NOT  DETECTED NOT DETECTED Final   Staphylococcus species NOT DETECTED NOT DETECTED Final   Staphylococcus aureus NOT DETECTED NOT DETECTED Final   Methicillin resistance NOT DETECTED NOT DETECTED Final   Streptococcus species DETECTED (A) NOT DETECTED Final    Comment: CRITICAL RESULT CALLED TO, READ BACK BY AND VERIFIED WITH: Beulah Gandy D AT 1102 02/19/16 BY L BENFIELD    Streptococcus agalactiae DETECTED (A) NOT DETECTED Final    Comment: CRITICAL RESULT CALLED TO, READ BACK BY AND VERIFIED WITH: Beulah Gandy D AT 1102 02/19/16 BY L BENFIELD    Streptococcus pneumoniae NOT DETECTED NOT DETECTED Final   Streptococcus pyogenes NOT DETECTED NOT DETECTED Final   Acinetobacter baumannii NOT DETECTED NOT DETECTED Final   Enterobacteriaceae species NOT DETECTED NOT DETECTED Final   Enterobacter cloacae complex NOT DETECTED NOT DETECTED Final   Escherichia coli NOT DETECTED NOT DETECTED Final   Klebsiella oxytoca NOT DETECTED NOT DETECTED Final   Klebsiella pneumoniae NOT DETECTED NOT DETECTED Final   Proteus species NOT DETECTED NOT DETECTED Final   Serratia marcescens NOT DETECTED NOT DETECTED Final   Carbapenem resistance NOT DETECTED NOT DETECTED Final   Haemophilus influenzae NOT DETECTED NOT DETECTED Final   Neisseria meningitidis NOT DETECTED NOT DETECTED Final   Pseudomonas aeruginosa NOT DETECTED NOT DETECTED Final   Candida albicans NOT DETECTED NOT DETECTED Final   Candida glabrata NOT DETECTED NOT DETECTED Final   Candida krusei NOT DETECTED NOT DETECTED Final   Candida parapsilosis NOT DETECTED NOT DETECTED Final   Candida tropicalis NOT DETECTED NOT DETECTED Final  Culture, blood (Routine X 2) w Reflex to ID Panel     Status: None   Collection Time: 02/22/16 11:15 AM  Result Value Ref Range Status   Specimen Description BLOOD RIGHT ARM  Final   Special Requests IN PEDIATRIC BOTTLE 3CC  Final   Culture NO GROWTH 5 DAYS  Final   Report Status 02/27/2016 FINAL  Final  Culture,  blood (Routine X 2) w Reflex to ID Panel  Status: None   Collection Time: 02/22/16 11:23 AM  Result Value Ref Range Status   Specimen Description BLOOD RIGHT HAND  Final   Special Requests BOTTLES DRAWN AEROBIC ONLY [place]  Final   Culture NO GROWTH 5 DAYS  Final   Report Status 02/27/2016 FINAL  Final  Culture, blood (Routine X 2) w Reflex to ID Panel     Status: None (Preliminary result)   Collection Time: 02/26/16 10:18 AM  Result Value Ref Range Status   Specimen Description BLOOD RIGHT HAND  Final   Special Requests IN PEDIATRIC BOTTLE 1.5 CC  Final   Culture NO GROWTH 1 DAY  Final   Report Status PENDING  Incomplete  Culture, blood (Routine X 2) w Reflex to ID Panel     Status: None (Preliminary result)   Collection Time: 02/26/16 10:25 AM  Result Value Ref Range Status   Specimen Description BLOOD RIGHT HAND  Final   Special Requests IN PEDIATRIC BOTTLE 1CC  Final   Culture NO GROWTH 1 DAY  Final   Report Status PENDING  Incomplete  Stat Gram stain     Status: None   Collection Time: 02/26/16  1:40 PM  Result Value Ref Range Status   Specimen Description FLUID KNEE LEFT  Final   Special Requests Immunocompromised  Final   Gram Stain   Final    ABUNDANT WBC PRESENT, PREDOMINANTLY PMN NO ORGANISMS SEEN    Report Status 02/26/2016 FINAL  Final  Culture, body fluid-bottle     Status: None (Preliminary result)   Collection Time: 02/26/16  1:40 PM  Result Value Ref Range Status   Specimen Description FLUID KNEE LEFT  Final   Special Requests NONE  Final   Culture NO GROWTH < 24 HOURS  Final   Report Status PENDING  Incomplete         Radiology Studies: Dg Fluoro Guided Needle Plc Aspiration/injection Loc  02/26/2016  CLINICAL DATA:  Left knee pain, swelling and increased warmth. Moderate-sized left knee joint effusion on recent radiographs. Sepsis. Clinical concern for infected knee. EXAM: FLUORO GUIDED NEEDLE PLACEMENT CONTRAST:  None used. FLUOROSCOPY TIME:  Radiation  Exposure Index (as provided by the fluoroscopic device): Not applicable If the device does not provide the exposure index: Fluoroscopy Time (in minutes and seconds):  0 minutes 1 second Number of Acquired Images:  1 screen save COMPARISON:  Left knee radiographs dated 02/17/2016. FINDINGS: Using sterile technique, 1% lidocaine and fluoroscopic guidance, an 18 gauge spinal needle was inserted into the patellofemoral joint space using a lateral approach. 30 cc of clear, reddish fluid was withdrawn and sent to the laboratory for testing. The patient tolerated the procedure well with no immediate complications. IMPRESSION: Successful fluoroscopic guided left knee needle aspiration. Electronically Signed   By: Claudie Revering M.D.   On: 02/26/2016 14:02        Scheduled Meds: . calcium acetate  667 mg Oral TID WC  .  ceFAZolin (ANCEF) IV  2 g Intravenous Q T,Th,Sa-HD  . cinacalcet  60 mg Oral Q breakfast  . darbepoetin (ARANESP) injection - DIALYSIS  60 mcg Intravenous Q Thu-HD  . doxercalciferol      . doxercalciferol  3 mcg Intravenous Q T,Th,Sa-HD  . famotidine  10 mg Oral Daily  . feeding supplement (NEPRO CARB STEADY)  237 mL Oral BID BM  . feeding supplement (PRO-STAT SUGAR FREE 64)  30 mL Oral BID  . isosorbide mononitrate  60 mg Oral Daily  .  lanthanum  2,000 mg Oral TID WC  . mirtazapine  15 mg Oral QHS  . multivitamin  1 tablet Oral QHS  . pantoprazole  40 mg Oral Daily  . sodium chloride flush  3 mL Intravenous Q12H  . sodium chloride flush  3 mL Intravenous Q12H  . valACYclovir  500 mg Oral Q48H   Continuous Infusions:     LOS: 10 days    Time spent: 30 minutes.    Hosie Poisson, MD [facility] Pager [phone] If 7PM-7AM, please contact night-coverage [email or web address] Password [email or web address] 02/28/2016, 11:02 AM

## 2016-02-28 NOTE — Progress Notes (Signed)
[facility] has been requested to perform a transesophageal echocardiogram on 02/29/16 for bacteremia.  After careful review of history and examination, the risks and benefits of transesophageal echocardiogram have been explained including risks of esophageal damage, perforation (1:10,000 risk), bleeding, pharyngeal hematoma as well as other potential complications associated with conscious sedation including aspiration, arrhythmia, respiratory failure and death. Alternatives to treatment were discussed, questions were answered. Patient is willing to proceed.  TEE - Dr. Acie Fredrickson 02/29/16 @15 :00  . NPO after midnight. Meds with sips.   Carrie Leas, NP 02/28/2016 4:02 PM

## 2016-02-28 NOTE — Evaluation (Signed)
Physical Therapy RE-Evaluation Patient Details Name: [name] MRN: [id number] DOB: December 30, 1940 Today's Date: 02/28/2016   History of Present Illness  Pt is a 75 y.o. F who presented to [facility] on 5/19 with complaints of Lt shoulder and knee pain which began after HD the day prior.  Neurology evaluated in ED and did not believe that she had a CVA.  As per nephrology, history of behavioral issues and resident of SNF. X-rays of left shoulder and knee without fractures. Admitted for hypertensive urgency.  Pt's PMH includes ESRD, closed Rt distal femur fx, multiple myeloma, malnutrition, and HTN. Pt now s/p I&D of left knee.  Clinical Impression  Patient presents now s/p L knee I&D with continued limitations in mobility due to pain, weakness, poor activity tolerance as well as other issues listed in PT problem list.  She may continue to benefit from skilled PT in the acute setting for improving mobility to allow continued rehab at SNF level at d/c.     Follow Up Recommendations      Equipment Recommendations  None recommended by PT    Recommendations for Other Services       Precautions / Restrictions Precautions Precautions: Fall Restrictions Weight Bearing Restrictions: No      Mobility  Bed Mobility Overal bed mobility: Needs Assistance Bed Mobility: Rolling;Supine to Sit;Sit to Supine Rolling: Max assist   Supine to sit: +2 for physical assistance;Max assist Sit to supine: +2 for physical assistance;Total assist   General bed mobility comments: assist with bilateral LEs and trunk when going to sitting position.   Transfers Overall transfer level: Needs assistance   Transfers: Lateral/Scoot Transfers          Lateral/Scoot Transfers: Max assist General transfer comment: scooing on EOB to HOB x 2; attempted to see if pt would weight bear, but only minimally more due to lethargy s/p HD this AM  Ambulation/Gait                Stairs             Wheelchair Mobility    Modified Rankin (Stroke Patients Only)       Balance Overall balance assessment: Needs assistance   Sitting balance-Leahy Scale: Poor Sitting balance - Comments: needing minguard or S at least at EOB sitting for about 10 minutes wtih OT engaging in ADL's                                     Pertinent Vitals/Pain Pain Assessment: Faces Faces Pain Scale: Hurts even more Pain Location: knees, shoulders, bottom Pain Descriptors / Indicators: Grimacing;Contraction Pain Intervention(s): Monitored during session;Repositioned;Limited activity within patient's tolerance    Home Living Family/patient expects to be discharged to:: Skilled nursing facility                 Additional Comments: per PT note, from [facility] and plan is to return at d/c    Prior Function Level of Independence: Needs assistance   Gait / Transfers Assistance Needed: Ambulating 300 ft/day at [place] with PT (per PT eval)  ADL's / Homemaking Assistance Needed: Per pt, independent with bathing, dressing (per PT eval)        Hand Dominance   Dominant Hand: Right    Extremity/Trunk Assessment   Upper Extremity Assessment: Defer to OT evaluation           Lower Extremity  Assessment: Defer to PT evaluation RLE Deficits / Details: AAROM WFL, but painful, note knee joint changes with h/o OA; strength at least 3-/5 LLE Deficits / Details: AAROM about 95 degrees knee flexion, but pt pulls knees up in pain with movement; lifts antigravity, but not completely straight; strength grossly 3-/5     Communication   Communication: Expressive difficulties (pt lethargic-difficult to understand at beginning of session)  Cognition Arousal/Alertness: Lethargic Behavior During Therapy: WFL for tasks assessed/performed Overall Cognitive Status: No family/caregiver present to determine baseline cognitive functioning                      General  Comments General comments (skin integrity, edema, etc.): removed sacral pad due to soiled with stool and cleaned due to pads wet and had BM; pt c/o pain and noted area of denuded skin; RN aware    Exercises General Exercises - Lower Extremity Ankle Circles/Pumps: AROM;Both;5 reps;Seated Long Arc Quad: AAROM;Both;5 reps;Seated      Assessment/Plan    PT Assessment Patient needs continued PT services  PT Diagnosis Difficulty walking;Generalized weakness;Acute pain;Hemiplegia non-dominant side   PT Problem List Decreased strength;Decreased range of motion;Decreased balance;Decreased activity tolerance;Decreased mobility;Pain;Decreased skin integrity;Decreased knowledge of use of DME  PT Treatment Interventions DME instruction;Functional mobility training;Therapeutic activities;Therapeutic exercise;Wheelchair mobility training;Patient/family education;Balance training   PT Goals (Current goals can be found in the Care Plan section) Acute Rehab PT Goals Patient Stated Goal: To rest PT Goal Formulation: With patient Time For Goal Achievement: 03/13/16 Potential to Achieve Goals: Fair    Frequency Min 2X/week   Barriers to discharge        Co-evaluation PT/OT/SLP Co-Evaluation/Treatment: Yes Reason for Co-Treatment: Complexity of the patient's impairments (multi-system involvement);For patient/therapist safety PT goals addressed during session: Mobility/safety with mobility;Balance OT goals addressed during session: ADL's and self-care;Other (comment) (mobility)       End of Session Equipment Utilized During Treatment: Gait belt Activity Tolerance: Patient limited by fatigue Patient left: in bed;with call bell/phone within reach;with bed alarm set           Time: 1230-1256 PT Time Calculation (min) (ACUTE ONLY): 26 min   Charges:   PT Evaluation $PT Re-evaluation: 1 Procedure     PT G CodesReginia Naas 2016/03/10, 3:41 PM  Magda Kiel,  [place] 2016-03-10

## 2016-02-28 NOTE — Progress Notes (Signed)
Patient appears comfortable pain is improved in the knee  Dressings benign.   Plan: Weightbearing as tolerated range of motion as tolerated Antibiotics per ID DVT prophylaxis per primary team there is no orthopedic contraindication to DVT prophylaxis  Follow-up in my office in 2 weeks for recheck   Keslee Harrington D

## 2016-02-28 NOTE — Progress Notes (Signed)
[facility] Progress Note  Assessment/Plan: 1. Group b strep bacteremia - on ancef per pharmacy and primary . Fever curve improved Tmax 99.1 We can give ancef at OP dialysis- please specify duration of tmt at d/c; s/p I and D of left septic knee 02/27/16; ID following - TEE ordered. 2. L sided weakness - felt to be due to pain/ MS origin. Neuro saw- rec getting MRI but she could not cooperate with exam or MRI - non issue 3. HTN'sive urgency - resolved on amlodipine and hydralazine -variable; suspect was not taking meds as should - now on no BP meds;likely goes up when pt agitated.  4. ESRD - TTS HD- goal 3 L Na low 124 K 5.3 5. Vol - titrate EDW down- post HD was 2 kg under EDW 6.  Anemia - HGB now 10.3-->9.5 >9.3> 8.9 -  Aranesp 60 resumed 5/25  7. Metabolic bone disease - Continue binders- fosrenol and phoslo, VDRA, sensipar- phos 6.6- Corr Ca previously higher adjustments made- is better 8. Nutrition - Albumin 2.7---> 1.9 . Renal diet/prostat/renal vit/ added nepro 9. Relapsed and refractory multiple myeloma- recent chemoRx at [facility] 4-5 d pta- neutropenic- received stimulant - WBC responding 10. MS- She can be emotionally labile at baseline. Calm and well oriented/articulate this am- seems that MS changes have resolved- do not think she needs work up for infectious neuro pathology 11. Disp - SNF for d/c ; not willing to consider palliative care consult per PCP note 5/29  Myriam Jacobson, PA-C [facility] Beeper [phone] 02/28/2016,8:06 AM  LOS: 10 days   Pt seen, examined and agree w A/P as above. Not sure why Na+ low, doesn't appear to have much vol on , BS normal.  HD today, UF 3kg as tol.  Kelly Splinter MD [facility] pager [phone]    cell [phone] 02/28/2016, 9:16 AM   Subjective:   Eating snack on HD - hungry today.  Knee still hurts but much less.  Objective Filed Vitals:   02/28/16 0453 02/28/16 0704 02/28/16 0711  02/28/16 0730  BP: 115/70 115/74 102/66 118/75  Pulse: 69 71 70 69  Temp: 98.8 F (37.1 C) 98.5 F (36.9 C)    TempSrc: Oral Oral    Resp:  15    Height:      Weight: 57.3 kg (126 lb 5.2 oz)     SpO2: 100% 100%     Physical Exam General:calm and alert - looks good Heart: irreg irreg Lungs: no rales Abdomen: soft NT Extremities: left knee wrapped; right LE no sig edema Dialysis Access: left upper AVF   Dialysis Orders: TTS SW 3h 84mn 57kg 2/2.25 bath Hep none LUA AVF Aranesp 60 mcg IV weekly (last dose 02/16/16 HGB 11.1) Hectoral 4 mcg IV q treatment (Ca 7.7 C Ca 8.2 PTH 396 01/26/16 phos 5.7 02/09/16)  Additional Objective Labs: Basic Metabolic Panel:  Recent Labs Lab 02/23/16 0803 02/24/16 1205 02/25/16 0741 02/28/16 0515  NA 128* 128* 125* 124*  K 4.5 4.4 4.6 5.3*  CL 92* 91* 89* 87*  CO2 _0 GLUCOSE 100* 92 98 86  BUN 62* 44* 66* 78*  CREATININE 6.86* 4.98* 6.38* 6.98*  CALCIUM 8.4* 9.4 8.9 8.8*  PHOS 5.8* 6.3* 6.6*  --    Liver Function Tests:  Recent Labs Lab 02/23/16 0803 02/24/16 1205 02/25/16 0741  ALBUMIN 1.9* 1.9* 1.9*  CBC:  Recent Labs Lab 02/22/16 0400 02/23/16 0803 02/24/16 1206 02/25/16 0741  02/26/16 1015 02/28/16 0515  WBC 2.3* 3.1* 3.7* 4.5 6.1 5.4  NEUTROABS 1.3*  --   --   --   --   --   HGB 10.3* 9.6* 9.5* 9.0* 9.3* 8.9*  HCT 32.7* 30.3* 30.3* 28.1* 29.6* 28.4*  MCV 109.4* 109.0* 110.2* 108.9* 110.0* 109.2*  PLT 200 222 221 222 181 216   Blood Culture    Component Value Date/Time   SDES FLUID KNEE LEFT 02/26/2016 1340   SDES FLUID KNEE LEFT 02/26/2016 1340   SPECREQUEST Immunocompromised 02/26/2016 1340   SPECREQUEST NONE 02/26/2016 1340   CULT NO GROWTH < 24 HOURS 02/26/2016 1340   REPTSTATUS 02/26/2016 FINAL 02/26/2016 1340   REPTSTATUS PENDING 02/26/2016 1340    Studies/Results: Dg Fluoro Guided Needle Plc Aspiration/injection Loc  02/26/2016  CLINICAL DATA:  Left knee pain, swelling and  increased warmth. Moderate-sized left knee joint effusion on recent radiographs. Sepsis. Clinical concern for infected knee. EXAM: FLUORO GUIDED NEEDLE PLACEMENT CONTRAST:  None used. FLUOROSCOPY TIME:  Radiation Exposure Index (as provided by the fluoroscopic device): Not applicable If the device does not provide the exposure index: Fluoroscopy Time (in minutes and seconds):  0 minutes 1 second Number of Acquired Images:  1 screen save COMPARISON:  Left knee radiographs dated 02/17/2016. FINDINGS: Using sterile technique, 1% lidocaine and fluoroscopic guidance, an 18 gauge spinal needle was inserted into the patellofemoral joint space using a lateral approach. 30 cc of clear, reddish fluid was withdrawn and sent to the laboratory for testing. The patient tolerated the procedure well with no immediate complications. IMPRESSION: Successful fluoroscopic guided left knee needle aspiration. Electronically Signed   By: Claudie Revering M.D.   On: 02/26/2016 14:02   Medications:   . calcium acetate  667 mg Oral TID WC  .  ceFAZolin (ANCEF) IV  2 g Intravenous Q T,Th,Sa-HD  . cinacalcet  60 mg Oral Q breakfast  . darbepoetin (ARANESP) injection - DIALYSIS  60 mcg Intravenous Q Thu-HD  . doxercalciferol  3 mcg Intravenous Q T,Th,Sa-HD  . famotidine  10 mg Oral Daily  . feeding supplement (NEPRO CARB STEADY)  237 mL Oral BID BM  . feeding supplement (PRO-STAT SUGAR FREE 64)  30 mL Oral BID  . isosorbide mononitrate  60 mg Oral Daily  . lanthanum  2,000 mg Oral TID WC  . mirtazapine  15 mg Oral QHS  . multivitamin  1 tablet Oral QHS  . pantoprazole  40 mg Oral Daily  . sodium chloride flush  3 mL Intravenous Q12H  . sodium chloride flush  3 mL Intravenous Q12H  . valACYclovir  500 mg Oral Q48H

## 2016-02-28 NOTE — Evaluation (Addendum)
Occupational Therapy Evaluation Patient Details Name: [name] MRN: [id number] DOB: 06-23-1941 Today's Date: 02/28/2016    History of Present Illness Pt is a 75 y.o. F who presented to [facility] on 5/19 with complaints of Lt shoulder and knee pain which began after HD the day prior.  Neurology evaluated in ED and did not believe that she had a CVA.  As per nephrology, history of behavioral issues and resident of SNF. X-rays of left shoulder and knee without fractures. Admitted for hypertensive urgency.  Pt's PMH includes ESRD, closed Rt distal femur fx, multiple myeloma, malnutrition, and HTN. Pt now s/p I&D of left knee.   Clinical Impression   Pt s/p above. Unsure of pt's true PLOF with ADLs. Pt from SNF.  Feel pt will benefit from acute OT to increase independence and strength prior to d/c. Recommending to go to SNF upon d/c.    Follow Up Recommendations  SNF;Supervision/Assistance - 24 hour    Equipment Recommendations  Other (comment) (defer to next venue)    Recommendations for Other Services       Precautions / Restrictions Precautions Precautions: Fall Restrictions Weight Bearing Restrictions: No      Mobility Bed Mobility Overal bed mobility: Needs Assistance Bed Mobility: Rolling;Supine to Sit;Sit to Supine Rolling: Max assist   Supine to sit: +2 for physical assistance;Max assist Sit to supine: +2 for physical assistance;Total assist   General bed mobility comments: assist with bilateral LEs and trunk when going to sitting position.   Transfers Overall transfer level: Needs assistance   Transfers: Lateral/Scoot Transfers          Lateral/Scoot Transfers: Max assist General transfer comment: scooted towards HOB.    Balance Overall balance assessment: Needs assistance      Varying levels of assist for balance sitting EOB.                                    ADL Overall ADL's : Needs assistance/impaired     Grooming:  Sitting;Oral care;Maximal assistance;Applying deodorant;Wash/dry face;Bed level Grooming Details (indicate cue type and reason): rinsed out mouth sitting EOB-assist with cup; a lot of assist with putting on deodorant.             Lower Body Dressing: Total assistance;Bed level     Toilet Transfer Details (indicate cue type and reason): lateral scoot on bed with Max assist. Toileting- Clothing Manipulation and Hygiene: Maximal assistance;Bed level Toileting - Clothing Manipulation Details (indicate cue type and reason): assist to hold pt on her side but pt able to assist with washing off bottom-OT helped to get it more thoroughly cleaned             Vision     Perception     Praxis      Pertinent Vitals/Pain Pain Assessment: Faces Pain Location: knees, shoulders, bottom Pain Descriptors / Indicators: Grimacing;Other (Comment);Moaning Pain Intervention(s): Monitored during session;Repositioned     Hand Dominance     Extremity/Trunk Assessment Upper Extremity Assessment Upper Extremity Assessment:  (weakness in bilateral UEs)-difficult to assess due to lethargy   Lower Extremity Assessment Lower Extremity Assessment: Defer to PT evaluation       Communication Communication Communication: Expressive difficulties (pt lethargic-difficult to understand at beginning of session)   Cognition Arousal/Alertness: Lethargic Behavior During Therapy: WFL for tasks assessed/performed Overall Cognitive Status: No family/caregiver present to determine baseline cognitive functioning  General Comments       Exercises       Shoulder Instructions      Home Living Family/patient expects to be discharged to:: Unsure                                 Additional Comments: per PT note, from [facility] and plan is to return at d/c      Prior Functioning/Environment Level of Independence: Needs assistance  Gait / Transfers Assistance  Needed: Ambulating 300 ft/day at [place] with PT (per PT eval) ADL's / Homemaking Assistance Needed: Per pt, independent with bathing, dressing (per PT eval)        OT Diagnosis: Generalized weakness;Acute pain   OT Problem List: Decreased range of motion;Decreased strength;Decreased activity tolerance;Impaired balance (sitting and/or standing);Decreased knowledge of use of DME or AE;Pain   OT Treatment/Interventions: Self-care/ADL training;DME and/or AE instruction;Therapeutic activities;Patient/family education;Balance training;Cognitive remediation/compensation;Therapeutic exercise    OT Goals(Current goals can be found in the care plan section) Acute Rehab OT Goals Patient Stated Goal: not stated OT Goal Formulation: With patient Time For Goal Achievement: 03/13/16 Potential to Achieve Goals: Good ADL Goals Additional ADL Goal #1: Pt will perform exercise program for bilateral UEs with supervision to increase strength. Additional ADL Goal #2: Pt will perform bed mobility at Min A level as precursor for ADLs.  OT Frequency: Min 2X/week   Barriers to D/C:            Co-evaluation PT/OT/SLP Co-Evaluation/Treatment: Yes Reason for Co-Treatment: For patient/therapist safety;Other (comment) (pt had HD this am)   OT goals addressed during session: ADL's and self-care;Other (comment) (mobility)      End of Session    Activity Tolerance: Patient limited by lethargy Patient left: in bed;with call bell/phone within reach;with bed alarm set   Time: 9024-0973 OT Time Calculation (min): 23 min Charges:  OT General Charges $OT Visit: 1 Procedure OT Evaluation $OT Eval Moderate Complexity: 1 Procedure G-CodesBenito Mccreedy OTR/L [phone] 02/28/2016, 3:12 PM

## 2016-02-29 ENCOUNTER — Encounter (HOSPITAL_COMMUNITY)
Admission: EM | Disposition: A | Discharge status: Skilled Nursing Facility | Payer: Self-pay | Source: Home / Self Care | Attending: Internal Medicine

## 2016-02-29 ENCOUNTER — Encounter (HOSPITAL_COMMUNITY): Payer: Self-pay

## 2016-02-29 ENCOUNTER — Inpatient Hospital Stay (HOSPITAL_COMMUNITY): Payer: Medicare Other

## 2016-02-29 DIAGNOSIS — I120 Hypertensive chronic kidney disease with stage 5 chronic kidney disease or end stage renal disease: Secondary | ICD-10-CM

## 2016-02-29 DIAGNOSIS — I361 Nonrheumatic tricuspid (valve) insufficiency: Secondary | ICD-10-CM

## 2016-02-29 DIAGNOSIS — I330 Acute and subacute infective endocarditis: Secondary | ICD-10-CM | POA: Insufficient documentation

## 2016-02-29 DIAGNOSIS — M00062 Staphylococcal arthritis, left knee: Secondary | ICD-10-CM | POA: Insufficient documentation

## 2016-02-29 DIAGNOSIS — M00262 Other streptococcal arthritis, left knee: Secondary | ICD-10-CM

## 2016-02-29 DIAGNOSIS — M25562 Pain in left knee: Secondary | ICD-10-CM | POA: Insufficient documentation

## 2016-02-29 DIAGNOSIS — Z9889 Other specified postprocedural states: Secondary | ICD-10-CM

## 2016-02-29 DIAGNOSIS — I10 Essential (primary) hypertension: Secondary | ICD-10-CM

## 2016-02-29 DIAGNOSIS — E875 Hyperkalemia: Secondary | ICD-10-CM

## 2016-02-29 HISTORY — PX: TEE WITHOUT CARDIOVERSION: SHX5443

## 2016-02-29 SURGERY — ECHOCARDIOGRAM, TRANSESOPHAGEAL
Anesthesia: Moderate Sedation

## 2016-02-29 MED ORDER — BUTAMBEN-TETRACAINE-BENZOCAINE 2-2-14 % EX AERO
INHALATION_SPRAY | CUTANEOUS | Status: DC | PRN
  Administered 2016-02-29: 2 via TOPICAL

## 2016-02-29 MED ORDER — FENTANYL CITRATE (PF) 100 MCG/2ML IJ SOLN
INTRAMUSCULAR | Status: AC
  Filled 2016-02-29: qty 2

## 2016-02-29 MED ORDER — MIDAZOLAM HCL 5 MG/ML IJ SOLN
INTRAMUSCULAR | Status: AC
  Filled 2016-02-29: qty 2

## 2016-02-29 MED ORDER — MIDAZOLAM HCL 10 MG/2ML IJ SOLN
INTRAMUSCULAR | Status: DC | PRN
  Administered 2016-02-29 (×3): 1 mg via INTRAVENOUS

## 2016-02-29 MED ORDER — FENTANYL CITRATE (PF) 100 MCG/2ML IJ SOLN
INTRAMUSCULAR | Status: DC | PRN
  Administered 2016-02-29 (×2): 12.5 ug via INTRAVENOUS

## 2016-02-29 NOTE — Interval H&P Note (Signed)
History and Physical Interval Note:  02/29/2016 10:15 AM  [name]  has presented today for surgery, with the diagnosis of BACTEREMIA  The various methods of treatment have been discussed with the patient and family. After consideration of risks, benefits and other options for treatment, the patient has consented to  Procedure(s): TRANSESOPHAGEAL ECHOCARDIOGRAM (TEE) (N/A) as a surgical intervention .  The patient's history has been reviewed, patient examined, no change in status, stable for surgery.  I have reviewed the patient's chart and labs.  Questions were answered to the patient's satisfaction.     Mertie Moores

## 2016-02-29 NOTE — Clinical Social Work Note (Signed)
CSW was able to reach the patient's daughter by phone to inform her of [facility]'s notification that the patient's bed has been given away. Patient's daughter was very upset about this information. CSW explained that the daughter will need to contact the facility to discuss this further as CSW does not have control over the facility's beds. CSW informed the daughter of other facilities that have offered but she states that the patient will not be going to another facility and that she will work it out with [employer]. CSW will continue to follow for DC needs.   Liz Beach MSW, [place], [place], JI:[id number]

## 2016-02-29 NOTE — Progress Notes (Addendum)
abtx day #11/day #7 cefazolin  INFECTIOUS DISEASE PROGRESS NOTE  ID: [name] is a 75 y.o. female with group b strep bacteremia c/b left septic knee s/p I x D on 5/29 and native MV endocarditis Active Problems:   ESRD (end stage renal disease) (HCC)   Multiple myeloma (HCC)   Hypertension associatd with end stage renal disease on dialysis   Anemia in neoplastic disease   Hyperkalemia   Hypertensive urgency   Swelling of joint of left knee   Weakness   Fever   Essential hypertension   Bacteremia due to group B Streptococcus   Bacterial endocarditis  Subjective: Afebrile  24hr events: underwent TEE this morning found to have small-med MV veg, and possible mobile eustachian valve in RA.  Abtx:  Anti-infectives    Start     Dose/Rate Route Frequency Ordered Stop   02/27/16 1330  ceFAZolin (ANCEF) IVPB 2g/100 mL premix  Status:  Discontinued     2 g 200 mL/hr over 30 Minutes Intravenous Every 6 hours 02/27/16 1328 02/27/16 1333   02/23/16 1800  ceFAZolin (ANCEF) IVPB 2g/100 mL premix  Status:  Discontinued     2 g 200 mL/hr over 30 Minutes Intravenous Every T-Th-Sa (1800) 02/23/16 0847 02/23/16 1055   02/23/16 1200  ceFAZolin (ANCEF) IVPB 2g/100 mL premix     2 g 200 mL/hr over 30 Minutes Intravenous Every T-Th-Sa (Hemodialysis) 02/23/16 1055     02/22/16 1000  valACYclovir (VALTREX) tablet 500 mg     500 mg Oral Every 48 hours 02/21/16 0904     02/21/16 1200  ceFAZolin (ANCEF) IVPB 2g/100 mL premix  Status:  Discontinued     2 g 200 mL/hr over 30 Minutes Intravenous Every T-Th-Sa (Hemodialysis) 02/19/16 1122 02/23/16 0847   02/19/16 0800  ceFEPIme (MAXIPIME) 2 g in dextrose 5 % 50 mL IVPB  Status:  Discontinued     2 g 100 mL/hr over 30 Minutes Intravenous Every T-Th-Sa (1800) 02/19/16 0742 02/19/16 1122   02/18/16 1000  valACYclovir (VALTREX) tablet 500 mg  Status:  Discontinued     500 mg Oral Daily 02/18/16 0229 02/21/16 0904      Medications:  Scheduled: .  calcium acetate  667 mg Oral TID WC  .  ceFAZolin (ANCEF) IV  2 g Intravenous Q T,Th,Sa-HD  . cinacalcet  60 mg Oral Q breakfast  . darbepoetin (ARANESP) injection - DIALYSIS  60 mcg Intravenous Q Thu-HD  . doxercalciferol  3 mcg Intravenous Q T,Th,Sa-HD  . famotidine  10 mg Oral Daily  . feeding supplement (NEPRO CARB STEADY)  237 mL Oral BID BM  . feeding supplement (PRO-STAT SUGAR FREE 64)  30 mL Oral BID  . isosorbide mononitrate  60 mg Oral Daily  . lanthanum  2,000 mg Oral TID WC  . mirtazapine  15 mg Oral QHS  . multivitamin  1 tablet Oral QHS  . pantoprazole  40 mg Oral Daily  . sodium chloride flush  3 mL Intravenous Q12H  . sodium chloride flush  3 mL Intravenous Q12H  . valACYclovir  500 mg Oral Q48H    Objective: Vital signs in last 24 hours: Temp:  [98.2 F (36.8 C)-99.3 F (37.4 C)] 99.3 F (37.4 C) (05/31 1319) Pulse Rate:  [69-88] 77 (05/31 1319) Resp:  [14-25] 18 (05/31 1319) BP: (90-135)/(52-97) 112/64 mmHg (05/31 1319) SpO2:  [98 %-100 %] 100 % (05/31 1319)   General appearance: no distress and awakens from sleep transiently Resp: clear  to auscultation bilaterally Cardio: regular rate and rhythm GI: normal findings: bowel sounds normal and soft, non-tender  Lab Results  Recent Labs  02/27/16 1108 02/28/16 0515  WBC  --  5.4  HGB 11.6* 8.9*  HCT 34.0* 28.4*  NA 126* 124*  K 5.0 5.3*  CL  --  87*  CO2  --  23  BUN  --  78*  CREATININE  --  6.98*    Microbiology: Recent Results (from the past 240 hour(s))  Culture, blood (Routine X 2) w Reflex to ID Panel     Status: None   Collection Time: 02/22/16 11:15 AM  Result Value Ref Range Status   Specimen Description BLOOD RIGHT ARM  Final   Special Requests IN PEDIATRIC BOTTLE 3CC  Final   Culture NO GROWTH 5 DAYS  Final   Report Status 02/27/2016 FINAL  Final  Culture, blood (Routine X 2) w Reflex to ID Panel     Status: None   Collection Time: 02/22/16 11:23 AM  Result Value Ref Range Status    Specimen Description BLOOD RIGHT HAND  Final   Special Requests BOTTLES DRAWN AEROBIC ONLY [place]  Final   Culture NO GROWTH 5 DAYS  Final   Report Status 02/27/2016 FINAL  Final  Culture, blood (Routine X 2) w Reflex to ID Panel     Status: None (Preliminary result)   Collection Time: 02/26/16 10:18 AM  Result Value Ref Range Status   Specimen Description BLOOD RIGHT HAND  Final   Special Requests IN PEDIATRIC BOTTLE 1.5 CC  Final   Culture NO GROWTH 3 DAYS  Final   Report Status PENDING  Incomplete  Culture, blood (Routine X 2) w Reflex to ID Panel     Status: None (Preliminary result)   Collection Time: 02/26/16 10:25 AM  Result Value Ref Range Status   Specimen Description BLOOD RIGHT HAND  Final   Special Requests IN PEDIATRIC BOTTLE 1CC  Final   Culture NO GROWTH 3 DAYS  Final   Report Status PENDING  Incomplete  Stat Gram stain     Status: None   Collection Time: 02/26/16  1:40 PM  Result Value Ref Range Status   Specimen Description FLUID KNEE LEFT  Final   Special Requests Immunocompromised  Final   Gram Stain   Final    ABUNDANT WBC PRESENT, PREDOMINANTLY PMN NO ORGANISMS SEEN    Report Status 02/26/2016 FINAL  Final  Acid Fast Smear (AFB)     Status: None   Collection Time: 02/26/16  1:40 PM  Result Value Ref Range Status   AFB Specimen Processing Concentration  Final   Acid Fast Smear Negative  Final    Comment: (NOTE) Performed At: [facility] [place], [place] [id number] Lindon Romp MD MV:[id number]    Source (AFB) FLUID  Final    Comment: KNEE LEFT   Culture, body fluid-bottle     Status: None (Preliminary result)   Collection Time: 02/26/16  1:40 PM  Result Value Ref Range Status   Specimen Description FLUID KNEE LEFT  Final   Special Requests NONE  Final   Culture NO GROWTH 3 DAYS  Final   Report Status PENDING  Incomplete    Studies/Results: No results found.   Assessment/Plan: Group B strep bacteremia c/b left knee  septic arthritis and native MV endocarditis   - continue on cefazolin 2gm IV post hemodialysis. Plan to treat for 4 weeks, using 5/28 as a  day 1 of 28, end date is June 24th. 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 She had been abtx for several days prior to arthroscopic wash out  Multiple Myeloma = current treatment may defer her next cycles of MM treatment   Will arrange for follow up in the ID clinic in 4 -5 wk  Will sign off          Earnestine Tuohey Infectious Diseases (pager) [phone] [email or web address] 02/29/2016, 2:17 PM  LOS: 11 days

## 2016-02-29 NOTE — Progress Notes (Signed)
PROGRESS NOTE  [name]  LFY:[id number] DOB: 11-Oct-1940  DOA: 02/17/2016 PCP: Benito Mccreedy, MD   Brief Narrative:  75 year old female with ESRD on TTS HD, multiple myeloma, HTN, GERD, anemia of chronic disease, malnutrition, presented to [facility] ED on 5/19 with complaints of left shoulder and knee pain which began after HD the day prior. Unable to clearly say if she was weak on that side. Also complained of nausea, vomiting and diarrhea. No acute issues as per OP dialysis staff. Neurology evaluated in ED and did not believe that she had a CVA. Markedly elevated blood pressure on arrival 171/151-briefly on Cardene drip which was weaned off when blood pressures improved. As per nephrology, history of behavioral issues and resident of SNF. X-rays of left shoulder and knee without fractures. Admitted for hypertensive urgency-resolved. Ruled in for group B strep bacteremia and UTI. Mental status changes resolved. 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  Assessment & Plan:   Active Problems:   ESRD (end stage renal disease) ([place])   Multiple myeloma (HCC)   Hypertension associatd with end stage renal disease on dialysis   Anemia in neoplastic disease   Hyperkalemia   Hypertensive urgency   Swelling of joint of left knee   Weakness   Fever   Essential hypertension   Bacteremia due to group B Streptococcus   Bacterial endocarditis  Neutropenic fever/group B strep bacteremia and sepsis from UTI, complicated by left knee septic arthritis and native MV endocarditis - Initially started on IV Ancef which is  given across HD. - Patient is immunocompromised from multiple myeloma and its treatment. - Status post left knee I&D on 5/29. - TEE 5/31 confirms small-medium MV vegetation and possible mobile eustachian valve in RA -  ID recommended LP for evaluation of HSV encephalitis in view of her  immunocompromised state. Pt refused and family aware.  - As per ID follow-up 5/31: Continue cefazolin 2 g IV post hemodialysis and plan to treat for 4 weeks using 5/28 as day 1 and end date June 24.  Hypertensive urgency - Initial blood pressure 171/151. Treated briefly in the ED with Cardene drip and initiated her home oral antihypertensive regimen. Blood pressures improved and weaned off Cardene drip. - Hypertension controlled.  B/L shoulders/knee pain and ?? Weakness - CT head without acute findings. No obvious focal deficits.  - X-rays of left shoulder without acute finding. X-ray of left knee shows joint effusion, probable loose body, tricompartmental osteoarthritis. Uric acid: Normal. No acute arthritis findings on physical exam. - Denies history of fall. - ? All related to osteoarthritis versus skeletal metastases from multiple myeloma. - Neurology consultation appreciated:Marland Kitchen However asymmetrical weakness felt to be more related to pain than stroke. Patient did not cooperate with MRI despite Ativan on 5/21. - Discussed with [name], oncology on 5/21 who indicated that patient has refractory multiple myeloma and has very poor prognosis. She recommended steroids for bony pain (however not started due to infection in immunosuppressed patients) or oxycodone (as per RN, patient refuses). She recommended palliative care consultation- discussed with daughter on 5/22 and she does not want to think about palliative care at this time.  - Please see above regarding I&D of left knee.  ESRD on TTS HD/mild hyperkalemia - Nephrology follow-up appreciated. Continue HD per nephrology-next HD on 6/1.  Pancytopenia - Probably multifactorial related to multiple myeloma and ESRD.  - Thrombocytopenia and leukopenia  resolved. Received last chemotherapy and Granix on 5/17. Hemoglobin gradually dropping. Aranesp resumed 5/25.  Relapsed and refractory multiple myeloma - No obvious fractures on x-rays of left  shoulder and knee.  - Followed at [facility]. Reviewed last progress note from 02/01/16 in care everywhere: No signs or symptoms to suggest progression. Patient determined to continue aggressive treatment. Chemotherapy was held on that day due to [facility] of 0.3 and she has had issues with neutropenia and requires G-CSF support. She received bortezomib, granix, cyclophosphamide on 5/17 at [facility]. - Please see above regarding discussion with oncology. Not open to palliative care consultation.  History of behavioral issues/confusion/altered mental status - Monitor. Current ongoing issues. - As per Dr. Moshe Cipro, Nephrology who is familiar with patient, on 5/22 patient seemed to be acting her normal but on 5/23 MS seemed worse. Etiology unclear. Her valtrex dose was reduced.  - Mental status improved.  Chronic hyponatremia - May be related to ESRD and multiple myeloma.decreased to 128 today. Management per nephrology.     DVT prophylaxis: SCDs+ Code Status: Full Family Communication: Discussed with patient. No family at bedside. No family at bedside. Disposition Plan: DC to SNF when medically stable-possibly 6/1.   Consultants:   Nephrology  Neurology  Orthopedics.   Radiology  Infectious disease  Procedures:   HD  Arthrocentesis of the left knee 5/28  Arthroscopic irrigation and debridement 5/29  TEE. 5/30  Antimicrobials:   Valacyclovir 5/20 >  Cefepime 5/21 >5/23  Ancef 5/23  Subjective: Seen this morning prior to TEE. States that her left knee pain is better and is able to move it better. Was complaining that she was hungry while she was nothing by mouth.   Objective:  Filed Vitals:   02/29/16 1043 02/29/16 1050 02/29/16 1100 02/29/16 1319  BP: 1'02/58 95/55 93/52 ' 112/64  Pulse: 73 71 70 77  Temp:    99.3 F (37.4 C)  TempSrc:    Oral  Resp: '19 14 15 18  ' Height:      Weight:      SpO2: 99% 98% 98% 100%    Intake/Output Summary (Last 24  hours) at 02/29/16 1517 Last data filed at 02/29/16 1300  Gross per 24 hour  Intake    240 ml  Output      0 ml  Net    240 ml   Filed Weights   02/27/16 0530 02/28/16 0453 02/28/16 1056  Weight: 59.6 kg (131 lb 6.3 oz) 57.3 kg (126 lb 5.2 oz) 56.1 kg (123 lb 10.9 oz)    Examination:  General exam: Pleasant elderly female without any complaints.  Respiratory system: Clear to auscultation. Respiratory effort normal. Cardiovascular system: S1 & S2 heard, RRR. No JVD, murmurs, rubs, gallops or clicks. No pedal edema.  Gastrointestinal system: Abdomen is nondistended, soft and nontender. No organomegaly or masses felt. Normal bowel sounds heard. Central nervous system: Alert and oriented to place and person. No focal neurological deficits. Extremities: Chronic boggy swelling of both knees. Left knee dressing clean and dry. Skin: No rashes, lesions or ulcers Psychiatry: Judgement and insight appear poor. Mood & affect appropriate.     Data Reviewed: I have personally reviewed following labs and imaging studies  CBC:  Recent Labs Lab 02/23/16 0803 02/24/16 1206 02/25/16 0741 02/26/16 1015 02/27/16 1108 02/28/16 0515  WBC 3.1* 3.7* 4.5 6.1  --  5.4  HGB 9.6* 9.5* 9.0* 9.3* 11.6* 8.9*  HCT 30.3* 30.3* 28.1* 29.6* 34.0* 28.4*  MCV  109.0* 110.2* 108.9* 110.0*  --  109.2*  PLT 222 221 222 181  --  010   Basic Metabolic Panel:  Recent Labs Lab 02/23/16 0803 02/24/16 1205 02/25/16 0741 02/27/16 1108 02/28/16 0515  NA 128* 128* 125* 126* 124*  K 4.5 4.4 4.6 5.0 5.3*  CL 92* 91* 89*  --  87*  CO2 '24 26 24  ' --  23  GLUCOSE 100* 92 98 84 86  BUN 62* 44* 66*  --  78*  CREATININE 6.86* 4.98* 6.38*  --  6.98*  CALCIUM 8.4* 9.4 8.9  --  8.8*  PHOS 5.8* 6.3* 6.6*  --   --    GFR: Estimated Creatinine Clearance: 6.1 mL/min (by C-G formula based on Cr of 6.98). Liver Function Tests:  Recent Labs Lab 02/23/16 0803 02/24/16 1205 02/25/16 0741  ALBUMIN 1.9* 1.9* 1.9*    No results for input(s): LIPASE, AMYLASE in the last 168 hours. No results for input(s): AMMONIA in the last 168 hours. Coagulation Profile: No results for input(s): INR, PROTIME in the last 168 hours. Cardiac Enzymes: No results for input(s): CKTOTAL, CKMB, CKMBINDEX, TROPONINI in the last 168 hours. BNP (last 3 results) No results for input(s): PROBNP in the last 8760 hours. HbA1C: No results for input(s): HGBA1C in the last 72 hours. CBG: No results for input(s): GLUCAP in the last 168 hours. Lipid Profile: No results for input(s): CHOL, HDL, LDLCALC, TRIG, CHOLHDL, LDLDIRECT in the last 72 hours. Thyroid Function Tests: No results for input(s): TSH, T4TOTAL, FREET4, T3FREE, THYROIDAB in the last 72 hours. Anemia Panel: No results for input(s): VITAMINB12, FOLATE, FERRITIN, TIBC, IRON, RETICCTPCT in the last 72 hours.  Sepsis Labs:  Recent Labs Lab 02/26/16 1010  LATICACIDVEN 1.9    Recent Results (from the past 240 hour(s))  Culture, blood (Routine X 2) w Reflex to ID Panel     Status: None   Collection Time: 02/22/16 11:15 AM  Result Value Ref Range Status   Specimen Description BLOOD RIGHT ARM  Final   Special Requests IN PEDIATRIC BOTTLE 3CC  Final   Culture NO GROWTH 5 DAYS  Final   Report Status 02/27/2016 FINAL  Final  Culture, blood (Routine X 2) w Reflex to ID Panel     Status: None   Collection Time: 02/22/16 11:23 AM  Result Value Ref Range Status   Specimen Description BLOOD RIGHT HAND  Final   Special Requests BOTTLES DRAWN AEROBIC ONLY [place]  Final   Culture NO GROWTH 5 DAYS  Final   Report Status 02/27/2016 FINAL  Final  Culture, blood (Routine X 2) w Reflex to ID Panel     Status: None (Preliminary result)   Collection Time: 02/26/16 10:18 AM  Result Value Ref Range Status   Specimen Description BLOOD RIGHT HAND  Final   Special Requests IN PEDIATRIC BOTTLE 1.5 CC  Final   Culture NO GROWTH 3 DAYS  Final   Report Status PENDING  Incomplete   Culture, blood (Routine X 2) w Reflex to ID Panel     Status: None (Preliminary result)   Collection Time: 02/26/16 10:25 AM  Result Value Ref Range Status   Specimen Description BLOOD RIGHT HAND  Final   Special Requests IN PEDIATRIC BOTTLE 1CC  Final   Culture NO GROWTH 3 DAYS  Final   Report Status PENDING  Incomplete  Stat Gram stain     Status: None   Collection Time: 02/26/16  1:40 PM  Result Value Ref  Range Status   Specimen Description FLUID KNEE LEFT  Final   Special Requests Immunocompromised  Final   Gram Stain   Final    ABUNDANT WBC PRESENT, PREDOMINANTLY PMN NO ORGANISMS SEEN    Report Status 02/26/2016 FINAL  Final  Acid Fast Smear (AFB)     Status: None   Collection Time: 02/26/16  1:40 PM  Result Value Ref Range Status   AFB Specimen Processing Concentration  Final   Acid Fast Smear Negative  Final    Comment: (NOTE) Performed At: [facility] [place], [place] [id number] Lindon Romp MD [id number]    Source (AFB) FLUID  Final    Comment: KNEE LEFT   Culture, body fluid-bottle     Status: None (Preliminary result)   Collection Time: 02/26/16  1:40 PM  Result Value Ref Range Status   Specimen Description FLUID KNEE LEFT  Final   Special Requests NONE  Final   Culture NO GROWTH 3 DAYS  Final   Report Status PENDING  Incomplete         Radiology Studies: No results found.      Scheduled Meds: . calcium acetate  667 mg Oral TID WC  .  ceFAZolin (ANCEF) IV  2 g Intravenous Q T,Th,Sa-HD  . cinacalcet  60 mg Oral Q breakfast  . darbepoetin (ARANESP) injection - DIALYSIS  60 mcg Intravenous Q Thu-HD  . doxercalciferol  3 mcg Intravenous Q T,Th,Sa-HD  . famotidine  10 mg Oral Daily  . feeding supplement (NEPRO CARB STEADY)  237 mL Oral BID BM  . feeding supplement (PRO-STAT SUGAR FREE 64)  30 mL Oral BID  . isosorbide mononitrate  60 mg Oral Daily  . lanthanum  2,000 mg Oral TID WC  . mirtazapine  15 mg Oral QHS  .  multivitamin  1 tablet Oral QHS  . pantoprazole  40 mg Oral Daily  . sodium chloride flush  3 mL Intravenous Q12H  . sodium chloride flush  3 mL Intravenous Q12H  . valACYclovir  500 mg Oral Q48H   Continuous Infusions:     LOS: 11 days    Time spent: 30 minutes.    [facility], MD [facility] Pager [phone] If 7PM-7AM, please contact night-coverage [email or web address] Password [email or web address] 02/29/2016, 3:17 PM

## 2016-02-29 NOTE — Progress Notes (Signed)
Echocardiogram Echocardiogram Transesophageal has been performed.  Tresa Res 02/29/2016, 11:05 AM

## 2016-02-29 NOTE — CV Procedure (Signed)
    Transesophageal Echocardiogram Note  [name] [id number] 02/05/1941  Procedure: Transesophageal Echocardiogram Indications: bacteremia  Procedure Details Consent: Obtained Time Out: Verified patient identification, verified procedure, site/side was marked, verified correct patient position, special equipment/implants available, Radiology Safety Procedures followed,  medications/allergies/relevent history reviewed, required imaging and test results available.  Performed  Medications:  During this procedure the patient is administered a total of Versed 3 mg and Fentanyl 25 mcg  to achieve and maintain moderate conscious sedation.  The patient's heart rate, blood pressure, and oxygen saturation are monitored continuously during the procedure. The period of conscious sedation is 30 minutes, of which I was present face-to-face 100% of this time.  Left Ventrical:  Normal LV function  Mitral Valve: There is a small - medium sized vegetation on the posterior leaflet of the MV..   Mild MR   Aortic Valve: normal   Tricuspid Valve: normal   Pulmonic Valve: normal   Left Atrium/ Left atrial appendage: no thrombi   Right atrium:  The  Eustachian  valve is very thickened and mobile .   I cannot R/O vegetation on the Eustachian valve .  This would be an unusual location but could have originated from a dialysis catheter.   Atrial septum: intact visually   Aorta: unremarkable    Complications: No apparent complications Patient did tolerate procedure well.   [name], [name]., MD, [facility] 02/29/2016, 10:36 AM    tee

## 2016-02-29 NOTE — Progress Notes (Signed)
[facility] Progress Note  Assessment/Plan: 1. Group b strep endocarditis/left septic knee - on ancef per pharmacy and primary . Fever curve slight ^ todayy to Tmax of 99.8 BC 5/28 still pendingWe can give ancef at OP dialysis- please specify duration of tmt at d/c; s/p I and D of left septic knee 02/27/16; ID following - TEE done today showed small-med sided vegetation on MV; also noted possible veg on Eustachian valve in right atrium per Dr. Acie Fredrickson. 2. L sided weakness - felt to be due to pain/ MS origin. Neuro saw- rec getting MRI but she could not cooperate with exam or MRI - non issue 3. HTN'sive urgency - resolved on amlodipine and hydralazine -variable; suspect was not taking meds as should - now on no BP meds;likely goes up when pt agitated. BP on the low side now. 4. ESRD - TTS HD- next HD Thursday first round 5. Vol - Net UF 2385 5/30 with post wt 56 - 1 kg below edw, NA low yeserday - recheck Thursday - start with goal 2 L 6. Anemia - HGB now 10.3-->9.5 >9.3> 8.9 - Aranesp 60 resumed 5/25  7. Metabolic bone disease - Continue binders- fosrenol and phoslo, VDRA, sensipar- phos 6.6- Corr Ca previously higher adjustments made- is better 8. Nutrition - Albumin 2.7---> 1.9 . Renal diet/prostat/renal vit/ added nepro 9. Relapsed and refractory multiple myeloma- recent chemoRx at [facility] 4-5 d pta- neutropenic- received stimulant - WBC responding 10. MS- She can be emotionally labile at baseline. 11. Disp - SNF for d/c ; not willing to consider palliative care consult per PCP note 5/29  Myriam Jacobson, PA-C [facility] Beeper [phone] 02/29/2016,1:00 PM  LOS: 11 days   Pt seen, examined and agree w A/P as above.  Kelly Splinter MD [facility] pager [phone]    cell [phone] 02/29/2016, 3:49 PM    Subjective:  Please uncover my leg. What causes that thing on my heart   Objective Filed Vitals:   02/29/16 1035 02/29/16 1043  02/29/16 1050 02/29/16 1100  BP: 90/54 1'02/58 95/55 93/52 '  Pulse: 88 73 71 70  Temp:      TempSrc:      Resp: '15 19 14 15  ' Height:      Weight:      SpO2: 100% 99% 98% 98%   Physical Exam General: NAD, though initially emotionally labile, though calms easily  Heart: RRR 3/6 murmur with ectopy Lungs: no rales Abdomen: soft NT Extremities: left knee wrapped - no sig LE edema Dialysis Access: left upper AVF + bruit  Dialysis Orders: TTS SW 3h 20mn 57kg 2/2.25 bath Hep none LUA AVF Aranesp 60 mcg IV weekly (last dose 02/16/16 HGB 11.1) Hectoral 4 mcg IV q treatment (Ca 7.7 C Ca 8.2 PTH 396 01/26/16 phos 5.7 02/09/16)  Additional Objective Labs: Basic Metabolic Panel:  Recent Labs Lab 02/23/16 0803 02/24/16 1205 02/25/16 0741 02/27/16 1108 02/28/16 0515  NA 128* 128* 125* 126* 124*  K 4.5 4.4 4.6 5.0 5.3*  CL 92* 91* 89*  --  87*  CO2 '24 26 24  ' --  23  GLUCOSE 100* 92 98 84 86  BUN 62* 44* 66*  --  78*  CREATININE 6.86* 4.98* 6.38*  --  6.98*  CALCIUM 8.4* 9.4 8.9  --  8.8*  PHOS 5.8* 6.3* 6.6*  --   --    Liver Function Tests:  Recent Labs Lab 02/23/16 0803 02/24/16 1205 02/25/16 0741  ALBUMIN  1.9* 1.9* 1.9*   CBC:  Recent Labs Lab 02/23/16 0803 02/24/16 1206 02/25/16 0741 02/26/16 1015 02/27/16 1108 02/28/16 0515  WBC 3.1* 3.7* 4.5 6.1  --  5.4  HGB 9.6* 9.5* 9.0* 9.3* 11.6* 8.9*  HCT 30.3* 30.3* 28.1* 29.6* 34.0* 28.4*  MCV 109.0* 110.2* 108.9* 110.0*  --  109.2*  PLT 222 221 222 181  --  216   Blood Culture    Component Value Date/Time   SDES FLUID KNEE LEFT 02/26/2016 1340   SDES FLUID KNEE LEFT 02/26/2016 1340   SPECREQUEST Immunocompromised 02/26/2016 1340   SPECREQUEST NONE 02/26/2016 1340   CULT NO GROWTH 3 DAYS 02/26/2016 1340   REPTSTATUS 02/26/2016 FINAL 02/26/2016 1340   REPTSTATUS PENDING 02/26/2016 1340   Medications:   . calcium acetate  667 mg Oral TID WC  .  ceFAZolin (ANCEF) IV  2 g Intravenous Q T,Th,Sa-HD  .  cinacalcet  60 mg Oral Q breakfast  . darbepoetin (ARANESP) injection - DIALYSIS  60 mcg Intravenous Q Thu-HD  . doxercalciferol  3 mcg Intravenous Q T,Th,Sa-HD  . famotidine  10 mg Oral Daily  . feeding supplement (NEPRO CARB STEADY)  237 mL Oral BID BM  . feeding supplement (PRO-STAT SUGAR FREE 64)  30 mL Oral BID  . isosorbide mononitrate  60 mg Oral Daily  . lanthanum  2,000 mg Oral TID WC  . mirtazapine  15 mg Oral QHS  . multivitamin  1 tablet Oral QHS  . pantoprazole  40 mg Oral Daily  . sodium chloride flush  3 mL Intravenous Q12H  . sodium chloride flush  3 mL Intravenous Q12H  . valACYclovir  500 mg Oral Q48H

## 2016-02-29 NOTE — H&P (View-Only) (Signed)
PROGRESS NOTE  [name]  HBZ:[id number] DOB: May 10, 1941  DOA: 02/17/2016 PCP: Benito Mccreedy, MD   Brief Narrative:  75 year old female with ESRD on TTS HD, multiple myeloma, HTN, GERD, anemia of chronic disease, malnutrition, presented to [facility] ED on 5/19 with complaints of left shoulder and knee pain which began after HD the day prior. Unable to clearly say if she was weak on that side. Also complained of nausea, vomiting and diarrhea. No acute issues as per OP dialysis staff. Neurology evaluated in ED and did not believe that she had a CVA. Markedly elevated blood pressure on arrival 171/151-briefly on Cardene drip which was weaned off when blood pressures improved. As per nephrology, history of behavioral issues and resident of SNF. X-rays of left shoulder and knee without fractures. Admitted for hypertensive urgency-resolved. Ruled in for group B strep bacteremia and UTI. At baseline seems to have some behavioral issues (daughter however denies) but fluctuating and somewhat worse in the hospital. Patient did not cooperate for MRI brain.  Hospital course from 5/24 to 5/30: Pt continued to have fever,   last 24 hours has been tmax of 99.1.   ID consulted and ordered repeat blood cultures which have been negative so far.  TTE and TEE ordered for evaluation of endocarditis.  Left knee joint aspiration and sent for analysis and orthopedics consulted, underwent  arthroscopic irrigation and debridement on 5/29.  Cardiology requested for TEE, which is planned for 5/30 vs 6/1.  Her mental status improved , and she refused LP, which was recommended by ID for evaluation of HSV encephalitis in view of her immunocompromised state. Discussed with the daughter.   Assessment & Plan:   Active Problems:   ESRD (end stage renal disease) ([place])   Multiple myeloma (HCC)   Hypertension associatd with end stage renal disease on dialysis   Hypertensive urgency   Swelling of joint of left knee   Weakness  Fever   Essential hypertension  Neutropenic fever/group B strep bacteremia and sepsis from UTI - On IV Ancef which is  given across HD. Duration to be determined prior to discharge. - Chest x-ray without acute findings.- Patient is immunocompromised from multiple myeloma and its treatment. - recurrent fever, repeat blood cultures ordered and they have been negative so far. Would continue with ancef. requested for ID consult for recurrent fevers.  - Plan for TEE, and arthrocentesis.  - Radiology consulted for arthrocentesis of the left knee and the fluid  sent for analysis. Orthopedics consulted and plan for knee arthroscopic irrigation and debridement.  -  ID recommended LP for evaluation of HSV encephalitis in view of her immunocompromised state. Pt refused and discussed with the daughter. Currently LP on hold.   Hypertensive urgency - Initial blood pressure 171/151. Treated briefly in the ED with Cardene drip and initiated her home oral antihypertensive regimen. Blood pressures improved and weaned off Cardene drip. -bp much better today.    B/L shoulders/knee pain and ?? Weakness - CT head without acute findings. No obvious focal deficits.  - X-rays of left shoulder without acute finding. X-ray of left knee shows joint effusion, probable loose body, tricompartmental osteoarthritis. Uric acid: Normal. No acute arthritis findings on physical exam. - Denies history of fall. - ? All related to osteoarthritis versus skeletal metastases from multiple myeloma. - Neurology consultation appreciated:Marland Kitchen However asymmetrical weakness felt to be more related to pain than stroke. Patient did not cooperate with MRI despite Ativan on 5/21. - Discussed with [name], oncology on 5/21  who indicated that patient has refractory multiple myeloma and has very poor prognosis. She recommended steroids for bony pain (however not started due to infection in immunosuppressed patients) or oxycodone (as per RN, patient  refuses). She recommended palliative care consultation- discussed with daughter on 5/22 and she does not want to think about palliative care at this time.  - SNF on discharge.  ESRD on TTS HD/mild hyperkalemia - Nephrology follow-up appreciated. Hyperkalemia resolved. Status post HD TTS  Pancytopenia - Probably multifactorial related to multiple myeloma and ESRD.  - Anemia stable. Thrombocytopenia resolved. Received last chemotherapy and Granix on 5/17.  Follow daily CBC with differential. Improving WBC count.  Relapsed and refractory multiple myeloma - No obvious fractures on x-rays of left shoulder and knee.  - [facility] at [facility]. Reviewed last progress note from 02/01/16 in care everywhere: No signs or symptoms to suggest progression. Patient determined to continue aggressive treatment. Chemotherapy was held on that day due to [facility] of 0.3 and she has had issues with neutropenia and requires G-CSF support. She received bortezomib, granix, cyclophosphamide on 5/17 at [facility]. - Please see above regarding discussion with oncology  History of behavioral issues/confusion/altered mental status - Monitor. Current ongoing issues. - As per Dr. Moshe Cipro, Nephrology who is familiar with patient, on 5/22 patient seemed to be acting her normal but on 5/23 MS seemed worse. Etiology unclear. Her valtrex dose was reduced. Today she is alert , less confused.   Chronic hyponatremia - May be related to ESRD and multiple myeloma.decreased to 128 today. . Management per nephrology.     DVT prophylaxis: SCDs+  Code Status: Full Family Communication: Discussed with patient. Marland Kitchen Updated care and answered questions. Discussed with daughter on 5/30. Disposition Plan: DC to SNF when medically stable.   Consultants:   Nephrology  Neurology  Orthopedics.   Radiology  Infectious disease    Procedures:   HD  Arthrocentesis of the left knee 5/28  Arthroscopic irrigation and  debridement 5/29  TEE. 5/30  Antimicrobials:   Valacyclovir 5/20 >  Cefepime 5/21 >5/23  Ancef 5/23  Subjective: She is alert and appears oriented.  Pain better controlled.    Objective:  Filed Vitals:   02/28/16 0930 02/28/16 1000 02/28/16 1030 02/28/16 1056  BP: 108/66 110/69 117/74   Pulse: 77 70 75   Temp:    98.4 F (36.9 C)  TempSrc:    Oral  Resp:    15  Height:      Weight:    56.1 kg (123 lb 10.9 oz)  SpO2:    100%    Intake/Output Summary (Last 24 hours) at 02/28/16 1102 Last data filed at 02/28/16 1056  Gross per 24 hour  Intake    490 ml  Output   2390 ml  Net  -1900 ml   Filed Weights   02/27/16 0530 02/28/16 0453 02/28/16 1056  Weight: 59.6 kg (131 lb 6.3 oz) 57.3 kg (126 lb 5.2 oz) 56.1 kg (123 lb 10.9 oz)    Examination:  General exam: Pleasant elderly female without any complaints.  Respiratory system: Clear to auscultation. Respiratory effort normal. Cardiovascular system: S1 & S2 heard, RRR. No JVD, murmurs, rubs, gallops or clicks. No pedal edema.  Gastrointestinal system: Abdomen is nondistended, soft and nontender. No organomegaly or masses felt. Normal bowel sounds heard. Central nervous system: Alert and oriented to place and person. No focal neurological deficits. Extremities: swelling of both knees, tender to touch. Skin: No rashes,  lesions or ulcers Psychiatry: Judgement and insight appear poor. Mood & affect appropriate.     Data Reviewed: I have personally reviewed following labs and imaging studies  CBC:  Recent Labs Lab 02/22/16 0400 02/23/16 0803 02/24/16 1206 02/25/16 0741 02/26/16 1015 02/28/16 0515  WBC 2.3* 3.1* 3.7* 4.5 6.1 5.4  NEUTROABS 1.3*  --   --   --   --   --   HGB 10.3* 9.6* 9.5* 9.0* 9.3* 8.9*  HCT 32.7* 30.3* 30.3* 28.1* 29.6* 28.4*  MCV 109.4* 109.0* 110.2* 108.9* 110.0* 109.2*  PLT 200 222 221 222 181 301   Basic Metabolic Panel:  Recent Labs Lab 02/21/16 1511 02/22/16 1005  02/23/16 0803 02/24/16 1205 02/25/16 0741 02/28/16 0515  NA 130*  --  128* 128* 125* 124*  K 5.5* 4.3 4.5 4.4 4.6 5.3*  CL 92*  --  92* 91* 89* 87*  CO2 23  --  '24 26 24 23  ' GLUCOSE 76  --  100* 92 98 86  BUN 70*  --  62* 44* 66* 78*  CREATININE 8.59*  --  6.86* 4.98* 6.38* 6.98*  CALCIUM 8.0*  --  8.4* 9.4 8.9 8.8*  PHOS 7.2*  --  5.8* 6.3* 6.6*  --    GFR: Estimated Creatinine Clearance: 6.1 mL/min (by C-G formula based on Cr of 6.98). Liver Function Tests:  Recent Labs Lab 02/21/16 1511 02/23/16 0803 02/24/16 1205 02/25/16 0741  ALBUMIN 2.1* 1.9* 1.9* 1.9*   No results for input(s): LIPASE, AMYLASE in the last 168 hours. No results for input(s): AMMONIA in the last 168 hours. Coagulation Profile: No results for input(s): INR, PROTIME in the last 168 hours. Cardiac Enzymes: No results for input(s): CKTOTAL, CKMB, CKMBINDEX, TROPONINI in the last 168 hours. BNP (last 3 results) No results for input(s): PROBNP in the last 8760 hours. HbA1C: No results for input(s): HGBA1C in the last 72 hours. CBG: No results for input(s): GLUCAP in the last 168 hours. Lipid Profile: No results for input(s): CHOL, HDL, LDLCALC, TRIG, CHOLHDL, LDLDIRECT in the last 72 hours. Thyroid Function Tests: No results for input(s): TSH, T4TOTAL, FREET4, T3FREE, THYROIDAB in the last 72 hours. Anemia Panel: No results for input(s): VITAMINB12, FOLATE, FERRITIN, TIBC, IRON, RETICCTPCT in the last 72 hours.  Sepsis Labs:  Recent Labs Lab 02/26/16 1010  LATICACIDVEN 1.9    Recent Results (from the past 240 hour(s))  MRSA PCR Screening     Status: None   Collection Time: 02/18/16  6:29 PM  Result Value Ref Range Status   MRSA by PCR NEGATIVE NEGATIVE Final    Comment:        The GeneXpert MRSA Assay (FDA approved for NASAL specimens only), is one component of a comprehensive MRSA colonization surveillance program. It is not intended to diagnose MRSA infection nor to guide  or monitor treatment for MRSA infections.   Culture, blood (routine x 2)     Status: Abnormal   Collection Time: 02/18/16  9:40 PM  Result Value Ref Range Status   Specimen Description BLOOD HEMODIALYSIS CATHETER  Final   Special Requests BOTTLES DRAWN AEROBIC AND ANAEROBIC 10CC  Final   Culture  Setup Time   Final    GRAM POSITIVE COCCI IN CHAINS IN BOTH AEROBIC AND ANAEROBIC BOTTLES CRITICAL RESULT CALLED TO, READ BACK BY AND VERIFIED WITH: C DAVIS,RN AT 1144 02/19/16 BY L BENFIELD    Culture (A)  Final    GROUP B STREP(S.AGALACTIAE)ISOLATED SUSCEPTIBILITIES PERFORMED ON PREVIOUS  CULTURE WITHIN THE LAST 5 DAYS.    Report Status 02/22/2016 FINAL  Final  Culture, blood (routine x 2)     Status: Abnormal   Collection Time: 02/18/16  9:50 PM  Result Value Ref Range Status   Specimen Description BLOOD HEMODIALYSIS CATHETER  Final   Special Requests BOTTLES DRAWN AEROBIC AND ANAEROBIC 10CC   Final   Culture  Setup Time   Final    GRAM POSITIVE COCCI IN CHAINS IN BOTH AEROBIC AND ANAEROBIC BOTTLES CRITICAL RESULT CALLED TO, READ BACK BY AND VERIFIED WITH: Beulah Gandy D AT 1102 02/19/16 BY L BENFIELD    Culture GROUP B STREP(S.AGALACTIAE)ISOLATED (A)  Final   Report Status 02/22/2016 FINAL  Final   Organism ID, Bacteria GROUP B STREP(S.AGALACTIAE)ISOLATED  Final      Susceptibility   Group b strep(s.agalactiae)isolated - MIC*    CLINDAMYCIN <=0.25 SENSITIVE Sensitive     AMPICILLIN <=0.25 SENSITIVE Sensitive     ERYTHROMYCIN <=0.12 SENSITIVE Sensitive     VANCOMYCIN 0.5 SENSITIVE Sensitive     CEFTRIAXONE <=0.12 SENSITIVE Sensitive     LEVOFLOXACIN 1 SENSITIVE Sensitive     * GROUP B STREP(S.AGALACTIAE)ISOLATED  Blood Culture ID Panel (Reflexed)     Status: Abnormal   Collection Time: 02/18/16  9:50 PM  Result Value Ref Range Status   Enterococcus species NOT DETECTED NOT DETECTED Final   Vancomycin resistance NOT DETECTED NOT DETECTED Final   Listeria monocytogenes NOT  DETECTED NOT DETECTED Final   Staphylococcus species NOT DETECTED NOT DETECTED Final   Staphylococcus aureus NOT DETECTED NOT DETECTED Final   Methicillin resistance NOT DETECTED NOT DETECTED Final   Streptococcus species DETECTED (A) NOT DETECTED Final    Comment: CRITICAL RESULT CALLED TO, READ BACK BY AND VERIFIED WITH: Beulah Gandy D AT 1102 02/19/16 BY L BENFIELD    Streptococcus agalactiae DETECTED (A) NOT DETECTED Final    Comment: CRITICAL RESULT CALLED TO, READ BACK BY AND VERIFIED WITH: Beulah Gandy D AT 1102 02/19/16 BY L BENFIELD    Streptococcus pneumoniae NOT DETECTED NOT DETECTED Final   Streptococcus pyogenes NOT DETECTED NOT DETECTED Final   Acinetobacter baumannii NOT DETECTED NOT DETECTED Final   Enterobacteriaceae species NOT DETECTED NOT DETECTED Final   Enterobacter cloacae complex NOT DETECTED NOT DETECTED Final   Escherichia coli NOT DETECTED NOT DETECTED Final   Klebsiella oxytoca NOT DETECTED NOT DETECTED Final   Klebsiella pneumoniae NOT DETECTED NOT DETECTED Final   Proteus species NOT DETECTED NOT DETECTED Final   Serratia marcescens NOT DETECTED NOT DETECTED Final   Carbapenem resistance NOT DETECTED NOT DETECTED Final   Haemophilus influenzae NOT DETECTED NOT DETECTED Final   Neisseria meningitidis NOT DETECTED NOT DETECTED Final   Pseudomonas aeruginosa NOT DETECTED NOT DETECTED Final   Candida albicans NOT DETECTED NOT DETECTED Final   Candida glabrata NOT DETECTED NOT DETECTED Final   Candida krusei NOT DETECTED NOT DETECTED Final   Candida parapsilosis NOT DETECTED NOT DETECTED Final   Candida tropicalis NOT DETECTED NOT DETECTED Final  Culture, blood (Routine X 2) w Reflex to ID Panel     Status: None   Collection Time: 02/22/16 11:15 AM  Result Value Ref Range Status   Specimen Description BLOOD RIGHT ARM  Final   Special Requests IN PEDIATRIC BOTTLE 3CC  Final   Culture NO GROWTH 5 DAYS  Final   Report Status 02/27/2016 FINAL  Final  Culture,  blood (Routine X 2) w Reflex to ID Panel  Status: None   Collection Time: 02/22/16 11:23 AM  Result Value Ref Range Status   Specimen Description BLOOD RIGHT HAND  Final   Special Requests BOTTLES DRAWN AEROBIC ONLY [place]  Final   Culture NO GROWTH 5 DAYS  Final   Report Status 02/27/2016 FINAL  Final  Culture, blood (Routine X 2) w Reflex to ID Panel     Status: None (Preliminary result)   Collection Time: 02/26/16 10:18 AM  Result Value Ref Range Status   Specimen Description BLOOD RIGHT HAND  Final   Special Requests IN PEDIATRIC BOTTLE 1.5 CC  Final   Culture NO GROWTH 1 DAY  Final   Report Status PENDING  Incomplete  Culture, blood (Routine X 2) w Reflex to ID Panel     Status: None (Preliminary result)   Collection Time: 02/26/16 10:25 AM  Result Value Ref Range Status   Specimen Description BLOOD RIGHT HAND  Final   Special Requests IN PEDIATRIC BOTTLE 1CC  Final   Culture NO GROWTH 1 DAY  Final   Report Status PENDING  Incomplete  Stat Gram stain     Status: None   Collection Time: 02/26/16  1:40 PM  Result Value Ref Range Status   Specimen Description FLUID KNEE LEFT  Final   Special Requests Immunocompromised  Final   Gram Stain   Final    ABUNDANT WBC PRESENT, PREDOMINANTLY PMN NO ORGANISMS SEEN    Report Status 02/26/2016 FINAL  Final  Culture, body fluid-bottle     Status: None (Preliminary result)   Collection Time: 02/26/16  1:40 PM  Result Value Ref Range Status   Specimen Description FLUID KNEE LEFT  Final   Special Requests NONE  Final   Culture NO GROWTH < 24 HOURS  Final   Report Status PENDING  Incomplete         Radiology Studies: Dg Fluoro Guided Needle Plc Aspiration/injection Loc  02/26/2016  CLINICAL DATA:  Left knee pain, swelling and increased warmth. Moderate-sized left knee joint effusion on recent radiographs. Sepsis. Clinical concern for infected knee. EXAM: FLUORO GUIDED NEEDLE PLACEMENT CONTRAST:  None used. FLUOROSCOPY TIME:  Radiation  Exposure Index (as provided by the fluoroscopic device): Not applicable If the device does not provide the exposure index: Fluoroscopy Time (in minutes and seconds):  0 minutes 1 second Number of Acquired Images:  1 screen save COMPARISON:  Left knee radiographs dated 02/17/2016. FINDINGS: Using sterile technique, 1% lidocaine and fluoroscopic guidance, an 18 gauge spinal needle was inserted into the patellofemoral joint space using a lateral approach. 30 cc of clear, reddish fluid was withdrawn and sent to the laboratory for testing. The patient tolerated the procedure well with no immediate complications. IMPRESSION: Successful fluoroscopic guided left knee needle aspiration. Electronically Signed   By: Claudie Revering M.D.   On: 02/26/2016 14:02        Scheduled Meds: . calcium acetate  667 mg Oral TID WC  .  ceFAZolin (ANCEF) IV  2 g Intravenous Q T,Th,Sa-HD  . cinacalcet  60 mg Oral Q breakfast  . darbepoetin (ARANESP) injection - DIALYSIS  60 mcg Intravenous Q Thu-HD  . doxercalciferol      . doxercalciferol  3 mcg Intravenous Q T,Th,Sa-HD  . famotidine  10 mg Oral Daily  . feeding supplement (NEPRO CARB STEADY)  237 mL Oral BID BM  . feeding supplement (PRO-STAT SUGAR FREE 64)  30 mL Oral BID  . isosorbide mononitrate  60 mg Oral Daily  .  lanthanum  2,000 mg Oral TID WC  . mirtazapine  15 mg Oral QHS  . multivitamin  1 tablet Oral QHS  . pantoprazole  40 mg Oral Daily  . sodium chloride flush  3 mL Intravenous Q12H  . sodium chloride flush  3 mL Intravenous Q12H  . valACYclovir  500 mg Oral Q48H   Continuous Infusions:     LOS: 10 days    Time spent: 30 minutes.    Hosie Poisson, MD [facility] Pager [phone] If 7PM-7AM, please contact night-coverage [email or web address] Password [email or web address] 02/28/2016, 11:02 AM

## 2016-02-29 NOTE — Care Management Important Message (Signed)
Important Message  Patient Details  Name: [name] MRN: [id number] Date of Birth: Sep 18, 1941   Medicare Important Message Given:  Yes    Loann Quill 02/29/2016, 10:01 AM

## 2016-03-01 MED ORDER — HYDROCODONE-ACETAMINOPHEN 5-325 MG PO TABS
ORAL_TABLET | ORAL | Status: AC
  Filled 2016-03-01: qty 1

## 2016-03-01 MED ORDER — DOXERCALCIFEROL 4 MCG/2ML IV SOLN
INTRAVENOUS | Status: AC
  Administered 2016-03-01: 3 ug via INTRAVENOUS
  Filled 2016-03-01: qty 2

## 2016-03-01 MED ORDER — CEFAZOLIN SODIUM-DEXTROSE 2-4 GM/100ML-% IV SOLN: 2.0000 g | INTRAVENOUS | Status: AC

## 2016-03-01 MED ORDER — HYDROCODONE-ACETAMINOPHEN 5-325 MG PO TABS: 1.0000 | ORAL_TABLET | Freq: Four times a day (QID) | ORAL | Status: DC | PRN

## 2016-03-01 MED ORDER — NEPRO/CARBSTEADY PO LIQD: 237.0000 mL | Freq: Two times a day (BID) | ORAL | Status: DC

## 2016-03-01 MED ORDER — CALCIUM ACETATE (PHOS BINDER) 667 MG PO CAPS
667.0000 mg | ORAL_CAPSULE | Freq: Three times a day (TID) | ORAL | Status: DC
Start: 2016-03-01 — End: 2017-01-11

## 2016-03-01 MED ORDER — DARBEPOETIN ALFA 60 MCG/0.3ML IJ SOSY
PREFILLED_SYRINGE | INTRAMUSCULAR | Status: AC
  Filled 2016-03-01: qty 0.3

## 2016-03-01 MED ORDER — VALACYCLOVIR HCL 500 MG PO TABS: 500.0000 mg | ORAL_TABLET | ORAL | Status: DC

## 2016-03-01 NOTE — Progress Notes (Signed)
[facility] Progress Note  Assessment/Plan: 1. Group b strep endocarditis/left septic knee - on ancef as per d/c summary after d/c to be given with HD. .  s/p I and D of left septic knee 02/27/16;TEE showed small-med sided vegetation on MV; also noted possible veg on Eustachian valve in right atrium per Dr. Acie Fredrickson. 2. L sided weakness - felt to be due to pain/ MS origin. Neuro saw- rec getting MRI but she could not cooperate with exam or MRI - non issue 3. HTN'sive urgency - resolved on amlodipine and hydralazine -variable; suspect was not taking meds as should - now on no BP meds;likely goes up when pt agitated. BP on the low side now.aso meds topped - EDW lowered; follow up in outpt setting - monitor for further weight loss- keep BP ?90 4. ESRD - TTS - on HD - labs pending -  5. Vol - Net UF 2385 5/30 with post wt 56 - 1 kg below edw, Na low 5/30 - recheck Thursday - start with goal 2 L 6. Anemia - HGB now 10.3-->9.5 >9.3> 8.9 - Aranesp 60 resumed 5/25 - labs pending today 7. Metabolic bone disease - Continue binders- fosrenol and phoslo, VDRA, sensipar- phos 6.6- Corr Ca previously higher adjustments made- is better 8. Nutrition - Albumin 2.7---> 1.9 . Renal diet/prostat/renal vit/ added nepro 9. Relapsed and refractory multiple myeloma- recent [facility]Rx at [facility] 4-5 d pta- neutropenic- received stimulant - WBC responding 10. MS- She can be emotionally labile at baseline. 11. Disp - SNF for d/c to [facility] ; not willing to consider palliative care consult per PCP note 5/29  Myriam Jacobson, PA-C [place] [phone] 03/01/2016,1:01 PM  LOS: 12 days   Pt seen, examined and agree w A/P as above.  Kelly Splinter MD [facility] pager [phone]    cell [phone] 03/01/2016, 1:52 PM     Subjective:   No c/o  Objective Filed Vitals:   02/29/16 1607 02/29/16 1926 03/01/16 0006 03/01/16 0500  BP: 94/60 114/72 117/69 106/68  Pulse:  76 74 70 67  Temp: 99.5 F (37.5 C) 100.1 F (37.8 C) 99.7 F (37.6 C) 99.2 F (37.3 C)  TempSrc: Oral Oral Oral Oral  Resp: _0 Height:      Weight:    55.8 kg (123 lb 0.3 oz)  SpO2: 100% 98% 100% 100%   Physical Exam General: NAD comfortable on HD Heart: RRR 3/6 murmur Lungs: no rales Abdomen: soft NT  Extremities: left knee wrapped - no sig edema  Dialysis Access:  Left upper AVF   Dialysis Orders:  Additional Objective Labs: Basic Metabolic Panel:  Recent Labs Lab 02/24/16 1205 02/25/16 0741 02/27/16 1108 02/28/16 0515  NA 128* 125* 126* 124*  K 4.4 4.6 5.0 5.3*  CL 91* 89*  --  87*  CO2 26 24  --  23  GLUCOSE 92 98 84 86  BUN 44* 66*  --  78*  CREATININE 4.98* 6.38*  --  6.98*  CALCIUM 9.4 8.9  --  8.8*  PHOS 6.3* 6.6*  --   --    Liver Function Tests:  Recent Labs Lab 02/24/16 1205 02/25/16 0741  ALBUMIN 1.9* 1.9*   CBC:  Recent Labs Lab 02/24/16 1206 02/25/16 0741 02/26/16 1015 02/27/16 1108 02/28/16 0515  WBC 3.7* 4.5 6.1  --  5.4  HGB 9.5* 9.0* 9.3* 11.6* 8.9*  HCT 30.3* 28.1* 29.6* 34.0* 28.4*  MCV 110.2* 108.9*  110.0*  --  109.2*  PLT 221 222 181  --  216   Blood Culture    Component Value Date/Time   SDES FLUID KNEE LEFT 02/26/2016 1340   SDES FLUID KNEE LEFT 02/26/2016 1340   SPECREQUEST Immunocompromised 02/26/2016 1340   SPECREQUEST NONE 02/26/2016 1340   CULT NO GROWTH 3 DAYS 02/26/2016 1340   REPTSTATUS 02/26/2016 FINAL 02/26/2016 1340   REPTSTATUS PENDING 02/26/2016 1340   Medications:   . calcium acetate  667 mg Oral TID WC  .  ceFAZolin (ANCEF) IV  2 g Intravenous Q T,Th,Sa-HD  . cinacalcet  60 mg Oral Q breakfast  . darbepoetin (ARANESP) injection - DIALYSIS  60 mcg Intravenous Q Thu-HD  . doxercalciferol  3 mcg Intravenous Q T,Th,Sa-HD  . famotidine  10 mg Oral Daily  . feeding supplement (NEPRO CARB STEADY)  237 mL Oral BID BM  . feeding supplement (PRO-STAT SUGAR FREE 64)  30 mL Oral BID  . isosorbide  mononitrate  60 mg Oral Daily  . lanthanum  2,000 mg Oral TID WC  . mirtazapine  15 mg Oral QHS  . multivitamin  1 tablet Oral QHS  . pantoprazole  40 mg Oral Daily  . sodium chloride flush  3 mL Intravenous Q12H  . sodium chloride flush  3 mL Intravenous Q12H  . valACYclovir  500 mg Oral Q48H

## 2016-03-01 NOTE — Progress Notes (Signed)
Report called to Fara Chute, Therapist, sports at [facility] for Transfer to facility after Dialysis today.

## 2016-03-01 NOTE — Discharge Summary (Addendum)
Physician Discharge Summary  [name]  ZDG:[id number]  DOB: 1941-06-29  DOA: 02/17/2016  PCP: Benito Mccreedy, MD  Admit date: 02/17/2016 Discharge date: 03/01/2016  Time spent: Greater than 30 minutes  Recommendations for Outpatient Follow-up:  1. M.D. at SNF in 3 days. Please follow final blood culture results and left knee synovitis fluid culture results from 5/28, that were sent from hospital. 2. Dr. Edmonia Lynch, Orthopedics in 2 weeks. SNF to arrange. 3. Hemodialysis Center: Keep regular hemodialysis appointments on Tuesdays, Thursdays and Saturdays. 4. Oncology at [facility]: SNF to coordinate outpatient follow-up. 5. Dr. Carlyle Basques, Infectious disease in 4-5 weeks.  Discharge Diagnoses:  Active Problems:   ESRD (end stage renal disease) ([place])   Multiple myeloma (HCC)   Hypertension associatd with end stage renal disease on dialysis   Anemia in neoplastic disease   Hyperkalemia   Hypertensive urgency   Swelling of joint of left knee   Weakness   Fever   Essential hypertension   Bacteremia due to group B Streptococcus   Bacterial endocarditis   Staphylococcal arthritis of left knee (HCC)   Left knee pain   Discharge Condition: Improved & Stable  Diet recommendation: Heart healthy diet.  Filed Weights   02/28/16 0453 02/28/16 1056 03/01/16 0500  Weight: 57.3 kg (126 lb 5.2 oz) 56.1 kg (123 lb 10.9 oz) 55.8 kg (123 lb 0.3 oz)    History of present illness:  75 year old female with ESRD on TTS HD, multiple myeloma, HTN, GERD, anemia of chronic disease, malnutrition, presented to [facility] ED on 5/19 with complaints of left shoulder and knee pain which began after HD the day prior. Unable to clearly say if she was weak on that side. Also complained of nausea, vomiting and diarrhea. No acute issues as per OP dialysis staff. Neurology evaluated in ED and did not believe that she had a CVA. Markedly elevated blood pressure on arrival 171/151-briefly on Cardene drip  which was weaned off when blood pressures improved. As per nephrology, history of behavioral issues and resident of SNF. X-rays of left shoulder and knee without fractures. Admitted for hypertensive urgency-resolved. Ruled in for group B strep bacteremia and UTI. Mental status changes resolved. 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  Hospital Course:   Neutropenic fever/group B strep bacteremia and sepsis from UTI, complicated by left knee septic arthritis and native MV endocarditis - Initially started on IV Ancef which isgiven across HD. - Patient is immunocompromised from multiple myeloma and its treatment. - Status post left knee I&D on 5/29 by Dr. Edmonia Lynch, orthopedics. - TEE 5/31 confirmed small-medium MV vegetation and possible mobile eustachian valve in RA. Detailed report as below. - ID recommended LP for evaluation of HSV encephalitis in view of her immunocompromised state. Pt refused and family aware.  - As per ID follow-up 5/31: Continue cefazolin 2 g IV post hemodialysis Tuesdays, Thursdays & Saturdays and plan to treat for 4 weeks using 5/28 as day 1 of 28 and end date June 24. Infectious disease will arrange cardiology to repeat TTE at end of treatment and outpatient follow-up in ID clinic in 4-5 weeks.  Left knee septic arthritis - Presumably due to GBS, though culture negative. She had been on antibiotics for several days prior to arthroscopic washout. - Clinically improved. Outpatient follow-up with orthopedics.  Hypertensive urgency - Initial blood pressure 171/151. Treated briefly in the ED with  Cardene drip and initiated her home oral antihypertensive regimen. Blood pressures improved and weaned off Cardene drip. - Hypertension controlled. Home antihypertensives i.e. amlodipine and hydralazine were discontinued due to blood pressures being on the low side. Close  monitoring as outpatient and across HD.  B/L shoulders/knee pain and ?? Weakness - CT head without acute findings. No obvious focal deficits.  - X-rays of left shoulder without acute finding. X-ray of left knee shows joint effusion, probable loose body, tricompartmental osteoarthritis. Uric acid: Normal. No acute arthritis findings on physical exam. - Denies history of fall. - ? All related to osteoarthritis versus skeletal metastases from multiple myeloma. - Neurology consultation appreciated:Marland Kitchen However asymmetrical weakness felt to be more related to pain than stroke. Patient did not cooperate with MRI despite Ativan on 5/21. - Discussed with [name], oncology on 5/21 who indicated that patient has refractory multiple myeloma and has very poor prognosis. She recommended steroids for bony pain (however not started due to infection in immunosuppressed patients) or oxycodone (as per RN, patient refuses). She recommended palliative care consultation- discussed with daughter on 5/22 and she does not want to think about palliative care at this time.  - Please see above regarding I&D of left knee.  ESRD on TTS HD/mild hyperkalemia - Nephrology follow-up appreciated. Continue HD per nephrology. Discussed with Dr. Jonnie Finner and cleared for discharge to SNF postdialysis today. Multiple electrolyte abnormality (hyponatremia, mild hyperkalemia) management per nephrology across HD. Periodically follow BMP and CBC is at HD.  Pancytopenia - Probably multifactorial related to multiple myeloma and ESRD.  - Thrombocytopenia and leukopenia resolved. Received last chemotherapy and Granix on 5/17. Hemoglobin gradually dropping. Aranesp resumed 5/25. Follow CBCs closely as outpatient during HD.  Relapsed and refractory multiple myeloma - No obvious fractures on x-rays of left shoulder and knee.  - Followed at [facility]. Reviewed last progress note from 02/01/16 in care everywhere: No signs or symptoms to  suggest progression. Patient determined to continue aggressive treatment. Chemotherapy was held on that day due to [facility] of 0.3 and she has had issues with neutropenia and requires G-CSF support. She received bortezomib, granix, cyclophosphamide on 5/17 at [facility]. - Please see above regarding discussion with oncology. Patient/family not open to palliative care consultation. - Outpatient follow-up with oncology at [facility] who may consider holding chemotherapy until infection has cleared.  History of behavioral issues/confusion/altered mental status - Monitor. Current ongoing issues. - As per Dr. Moshe Cipro, Nephrology who is familiar with patient, on 5/22 patient seemed to be acting her normal but on 5/23 MS seemed worse. Etiology unclear. Her valtrex dose was reduced.  - Mental status improved and likely at baseline. As per nephrology team, patient can be emotionally labile at baseline  Chronic hyponatremia - May be related to ESRD and multiple myeloma. Management per nephrology.   Metabolic bone disease - Continue binders-Fosrenol and PhosLo and Sensipar. Outpatient follow-up with nephrology.    Consultants:   Nephrology  Neurology  Orthopedics.   Radiology  Infectious disease  Procedures:   HD  Arthrocentesis of the left knee 5/28  Arthroscopic irrigation and debridement 5/29  TEE. 5/30: Study Conclusions  - Left ventricle: The cavity size was normal. Wall thickness was  normal. Systolic function was normal. - Aortic valve: No evidence of vegetation. - Mitral valve: There was a medium-sized vegetation on the atrial  aspect of the base of the posterior leaflet. - Left atrium: No evidence of thrombus in the atrial cavity or  appendage. - Tricuspid valve: No evidence of vegetation.   Discharge Exam:  Complaints: Denies complaints. No pain reported. As per RN, no acute issues. Patient was seen prior to HD today.  Filed Vitals:   02/29/16  1607 02/29/16 1926 03/01/16 0006 03/01/16 0500  BP: 94/60 114/72 117/69 106/68  Pulse: 76 74 70 67  Temp: 99.5 F (37.5 C) 100.1 F (37.8 C) 99.7 F (37.6 C) 99.2 F (37.3 C)  TempSrc: Oral Oral Oral Oral  Resp: '18 17 16   ' Height:      Weight:    55.8 kg (123 lb 0.3 oz)  SpO2: 100% 98% 100% 100%    General exam: Pleasant elderly female sitting up comfortably in bed eating breakfast this morning. Respiratory system: Clear to auscultation. Respiratory effort normal. Cardiovascular system: S1 & S2 heard, RRR. No JVD, murmurs, rubs, gallops or clicks. No pedal edema. Telemetry: Sinus rhythm. Gastrointestinal system: Abdomen is nondistended, soft and nontender. No organomegaly or masses felt. Normal bowel sounds heard. Central nervous system: Alert and oriented to place and person. No focal neurological deficits. Extremities: Chronic boggy swelling of both knees. Left knee mildly warm but without any new or acute findings.. Skin: No rashes, lesions or ulcers Psychiatry: Judgement and insight appear poor. Mood & affect appropriate.   Discharge Instructions      Discharge Instructions    (HEART FAILURE PATIENTS) Call MD:  Anytime you have any of the following symptoms: 1) 3 pound weight gain in 24 hours or 5 pounds in 1 week 2) shortness of breath, with or without a dry hacking cough 3) swelling in the hands, feet or stomach 4) if you have to sleep on extra pillows at night in order to breathe.    Complete by:  As directed      Call MD for:  difficulty breathing, headache or visual disturbances    Complete by:  As directed      Call MD for:  extreme fatigue    Complete by:  As directed      Call MD for:  persistant dizziness or light-headedness    Complete by:  As directed      Call MD for:  persistant nausea and vomiting    Complete by:  As directed      Call MD for:  redness, tenderness, or signs of infection (pain, swelling, redness, odor or green/yellow discharge around incision  site)    Complete by:  As directed      Call MD for:  severe uncontrolled pain    Complete by:  As directed      Call MD for:  temperature >100.4    Complete by:  As directed      Diet - low sodium heart healthy    Complete by:  As directed      Increase activity slowly    Complete by:  As directed             Medication List    STOP taking these medications        amLODipine 10 MG tablet  Commonly known as:  NORVASC     furosemide 20 MG tablet  Commonly known as:  LASIX     hydrALAZINE 10 MG tablet  Commonly known as:  APRESOLINE     magnesium hydroxide 400 MG/5ML suspension  Commonly known as:  MILK OF MAGNESIA     sodium phosphate enema  Commonly known as:  FLEET      TAKE these  medications        bisacodyl 10 MG suppository  Commonly known as:  DULCOLAX  Place 1 suppository (10 mg total) rectally daily as needed for moderate constipation.     calcium acetate 667 MG capsule  Commonly known as:  PHOSLO  Take 1 capsule (667 mg total) by mouth 3 (three) times daily with meals.     ceFAZolin 2-4 GM/100ML-% IVPB  Commonly known as:  ANCEF  Inject 100 mLs (2 g total) into the vein Every Tuesday,Thursday,and Saturday with dialysis. End date: 03/24/16.     cinacalcet 60 MG tablet  Commonly known as:  SENSIPAR  Take 60 mg by mouth daily.     feeding supplement (NEPRO CARB STEADY) Liqd  Take 237 mLs by mouth 2 (two) times daily between meals.     feeding supplement (PRO-STAT SUGAR FREE 64) Liqd  Take 30 mLs by mouth 2 (two) times daily.     HYDROcodone-acetaminophen 5-325 MG tablet  Commonly known as:  NORCO/VICODIN  Take 1 tablet by mouth every 6 (six) hours as needed for moderate pain or severe pain.     isosorbide mononitrate 60 MG 24 hr tablet  Commonly known as:  IMDUR  Take 60 mg by mouth daily.     lanthanum 1000 MG chewable tablet  Commonly known as:  FOSRENOL  Chew 2,000 mg by mouth 3 (three) times daily with meals. Take 2 tablets (=2059m) by  mouth AC.     mirtazapine 15 MG disintegrating tablet  Commonly known as:  REMERON SOL-TAB  Take 15 mg by mouth at bedtime. Dissolve 1 tablet by mouth at bedtime for appetite.     multivitamin Tabs tablet  Take 1 tablet by mouth at bedtime.     ondansetron 4 MG disintegrating tablet  Commonly known as:  ZOFRAN ODT  Take 1 tablet (4 mg total) by mouth every 8 (eight) hours as needed for nausea or vomiting.     pantoprazole 40 MG tablet  Commonly known as:  PROTONIX  TAKE 1 TABLET BY MOUTH DAILY     prochlorperazine 10 MG tablet  Commonly known as:  COMPAZINE  Take 1 tablet (10 mg total) by mouth every 6 (six) hours as needed (Nausea or vomiting).     ranitidine 300 MG tablet  Commonly known as:  ZANTAC  Take 300 mg by mouth at bedtime.     simethicone 125 MG chewable tablet  Commonly known as:  MYLICON  Take 1 tablet by mouth prior to each meal.     valACYclovir 500 MG tablet  Commonly known as:  VALTREX  Take 1 tablet (500 mg total) by mouth every other day.     zolpidem 5 MG tablet  Commonly known as:  AMBIEN  Take 1 tablet (5 mg total) by mouth at bedtime as needed for sleep.       Follow-up Information    Follow up with MURPHY, TIMOTHY D, MD. Schedule an appointment as soon as possible for a visit in 2 weeks.   Specialty:  Orthopedic Surgery   Contact information:   [phone]      Follow up with MD at SNF. Schedule an appointment as soon as possible for a visit in 3 days.      Follow up with Hemodialysis Center.   Why:  Keep regular hemodialysis appointments on Tuesdays, Thursdays and Saturdays.      Schedule an appointment as soon as possible  for a visit with Oncology at [facility].   Why:  Please coordinate with them regarding next follow-up appointment for ongoing chemotherapy.      Follow up with Carlyle Basques, MD. Schedule an appointment as soon as possible for a visit in 4 weeks.    Specialty:  Infectious Diseases   Contact information:   [place] [address] [phone]       Get Medicines reviewed and adjusted: Please take all your medications with you for your next visit with your Primary MD  Please request your Primary MD to go over all hospital tests and procedure/radiological results at the follow up. Please ask your Primary MD to get all Hospital records sent to his/her office.  If you experience worsening of your admission symptoms, develop shortness of breath, life threatening emergency, suicidal or homicidal thoughts you must seek medical attention immediately by calling 911 or calling your MD immediately if symptoms less severe.  You must read complete instructions/literature along with all the possible adverse reactions/side effects for all the Medicines you take and that have been prescribed to you. Take any new Medicines after you have completely understood and accept all the possible adverse reactions/side effects.   Do not drive when taking pain medications.   Do not take more than prescribed Pain, Sleep and Anxiety Medications  Special Instructions: If you have smoked or chewed Tobacco in the last 2 yrs please stop smoking, stop any regular Alcohol and or any Recreational drug use.  Wear Seat belts while driving.  Please note  You were cared for by a hospitalist during your hospital stay. Once you are discharged, your primary care physician will handle any further medical issues. Please note that NO REFILLS for any discharge medications will be authorized once you are discharged, as it is imperative that you return to your primary care physician (or establish a relationship with a primary care physician if you do not have one) for your aftercare needs so that they can reassess your need for medications and monitor your lab values.    The results of significant diagnostics from this hospitalization (including  imaging, microbiology, ancillary and laboratory) are listed below for reference.    Significant Diagnostic Studies: Dg Chest 2 View  02/18/2016  CLINICAL DATA:  Fever. History of end-stage renal disease on dialysis, multiple myeloma. EXAM: CHEST  2 VIEW COMPARISON:  Chest radiograph September 03, 2015 FINDINGS: Cardiac silhouette is moderately enlarged. Tortuous calcified aorta. No pleural effusion or focal consolidation. Mildly elevated LEFT hemidiaphragm with strandy densities. No pneumothorax. Vascular clips project in LEFT humerus soft tissues. Patient's LEFT arm at side, limiting assessment of thoracic spine. IMPRESSION: Stable cardiomegaly.  LEFT lung base atelectasis. Electronically Signed   By: Elon Alas M.D.   On: 02/18/2016 02:31   Ct Head Wo Contrast  02/17/2016  CLINICAL DATA:  Left arm pain, hypertensive, weakness, weight loss. EXAM: CT HEAD WITHOUT CONTRAST TECHNIQUE: Contiguous axial images were obtained from the base of the skull through the vertex without intravenous contrast. COMPARISON:  Head CT dated 04/10/2015. FINDINGS: Brain: There is mild generalized age-related brain atrophy with commensurate dilatation of the ventricles and sulci. Mild chronic small vessel ischemic changes noted within the deep periventricular white matter. Small old lacunar infarcts noted within the basal ganglia. There is no mass, hemorrhage, edema or other evidence of acute parenchymal abnormality. No extra-axial hemorrhage. Vascular: No hyperdense vessel or unexpected calcification. There are chronic calcified atherosclerotic changes of  the large vessels at the skull base. Skull: Negative for fracture or focal lesion. Sinuses/Orbits: Visualized upper paranasal sinuses are clear. Mastoid air cells are clear. Other: None. IMPRESSION: No acute findings.  No intracranial mass, hemorrhage or edema. Electronically Signed   By: Franki Cabot M.D.   On: 02/17/2016 21:58   Dg Shoulder Left  02/18/2016  CLINICAL  DATA:  Pt states she has been having left arm pain and weakness since her dialysis needle was removed on Thursday. Hx multiple myeloma. EXAM: LEFT SHOULDER - 2+ VIEW COMPARISON:  None. FINDINGS: No fracture.  No bone lesion. Glenohumeral joint and AC joints are normally aligned. No significant arthropathic change. Bones are demineralized. Surgical vascular clips project along the medial left arm. IMPRESSION: No fracture or dislocation.  No acute finding.  No bone lesion. Electronically Signed   By: Lajean Manes M.D.   On: 02/18/2016 10:31   Dg Knee Complete 4 Views Left  02/17/2016  CLINICAL DATA:  Left knee pain and swelling.  No reported injury. EXAM: LEFT KNEE - COMPLETE 4+ VIEW COMPARISON:  09/03/2015 left knee radiographs FINDINGS: Moderate suprapatellar left knee joint effusion. A 1 cm round calcification medial and anterior to the left distal femoral shaft may represent a loose intra-articular body (this structure was seen lateral to the left distal femur on the 09/03/2015 radiographs). No fracture, dislocation or suspicious focal osseous lesion. Diffuse osteopenia. Mild tricompartmental left knee osteoarthritis. No appreciable joint erosions. Vascular calcifications throughout the soft tissues posteriorly. IMPRESSION: 1. New moderate suprapatellar left knee joint effusion. 2. Probable 1 cm loose intra-articular osseous body in the suprapatellar left knee joint. 3. Mild tricompartmental osteoarthritis in the left knee. No left knee joint erosions. 4. Diffuse osteopenia. Electronically Signed   By: Ilona Sorrel M.D.   On: 02/17/2016 21:41   Dg Fluoro Guided Needle Plc Aspiration/injection Loc  02/26/2016  CLINICAL DATA:  Left knee pain, swelling and increased warmth. Moderate-sized left knee joint effusion on recent radiographs. Sepsis. Clinical concern for infected knee. EXAM: FLUORO GUIDED NEEDLE PLACEMENT CONTRAST:  None used. FLUOROSCOPY TIME:  Radiation Exposure Index (as provided by the  fluoroscopic device): Not applicable If the device does not provide the exposure index: Fluoroscopy Time (in minutes and seconds):  0 minutes 1 second Number of Acquired Images:  1 screen save COMPARISON:  Left knee radiographs dated 02/17/2016. FINDINGS: Using sterile technique, 1% lidocaine and fluoroscopic guidance, an 18 gauge spinal needle was inserted into the patellofemoral joint space using a lateral approach. 30 cc of clear, reddish fluid was withdrawn and sent to the laboratory for testing. The patient tolerated the procedure well with no immediate complications. IMPRESSION: Successful fluoroscopic guided left knee needle aspiration. Electronically Signed   By: Claudie Revering M.D.   On: 02/26/2016 14:02    Microbiology: Recent Results (from the past 240 hour(s))  Culture, blood (Routine X 2) w Reflex to ID Panel     Status: None   Collection Time: 02/22/16 11:15 AM  Result Value Ref Range Status   Specimen Description BLOOD RIGHT ARM  Final   Special Requests IN PEDIATRIC BOTTLE 3CC  Final   Culture NO GROWTH 5 DAYS  Final   Report Status 02/27/2016 FINAL  Final  Culture, blood (Routine X 2) w Reflex to ID Panel     Status: None   Collection Time: 02/22/16 11:23 AM  Result Value Ref Range Status   Specimen Description BLOOD RIGHT HAND  Final   Special Requests BOTTLES DRAWN AEROBIC  ONLY [place]  Final   Culture NO GROWTH 5 DAYS  Final   Report Status 02/27/2016 FINAL  Final  Culture, blood (Routine X 2) w Reflex to ID Panel     Status: None (Preliminary result)   Collection Time: 02/26/16 10:18 AM  Result Value Ref Range Status   Specimen Description BLOOD RIGHT HAND  Final   Special Requests IN PEDIATRIC BOTTLE 1.5 CC  Final   Culture NO GROWTH 3 DAYS  Final   Report Status PENDING  Incomplete  Culture, blood (Routine X 2) w Reflex to ID Panel     Status: None (Preliminary result)   Collection Time: 02/26/16 10:25 AM  Result Value Ref Range Status   Specimen Description BLOOD RIGHT  HAND  Final   Special Requests IN PEDIATRIC BOTTLE 1CC  Final   Culture NO GROWTH 3 DAYS  Final   Report Status PENDING  Incomplete  Stat Gram stain     Status: None   Collection Time: 02/26/16  1:40 PM  Result Value Ref Range Status   Specimen Description FLUID KNEE LEFT  Final   Special Requests Immunocompromised  Final   Gram Stain   Final    ABUNDANT WBC PRESENT, PREDOMINANTLY PMN NO ORGANISMS SEEN    Report Status 02/26/2016 FINAL  Final  Acid Fast Smear (AFB)     Status: None   Collection Time: 02/26/16  1:40 PM  Result Value Ref Range Status   AFB Specimen Processing Concentration  Final   Acid Fast Smear Negative  Final    Comment: (NOTE) Performed At: [facility] [place], [place] [id number] Lindon Romp MD ZS:[id number]    Source (AFB) FLUID  Final    Comment: KNEE LEFT   Culture, body fluid-bottle     Status: None (Preliminary result)   Collection Time: 02/26/16  1:40 PM  Result Value Ref Range Status   Specimen Description FLUID KNEE LEFT  Final   Special Requests NONE  Final   Culture NO GROWTH 3 DAYS  Final   Report Status PENDING  Incomplete     Labs: Basic Metabolic Panel:  Recent Labs Lab 02/24/16 1205 02/25/16 0741 02/27/16 1108 02/28/16 0515  NA 128* 125* 126* 124*  K 4.4 4.6 5.0 5.3*  CL 91* 89*  --  87*  CO2 26 24  --  23  GLUCOSE 92 98 84 86  BUN 44* 66*  --  78*  CREATININE 4.98* 6.38*  --  6.98*  CALCIUM 9.4 8.9  --  8.8*  PHOS 6.3* 6.6*  --   --    Liver Function Tests:  Recent Labs Lab 02/24/16 1205 02/25/16 0741  ALBUMIN 1.9* 1.9*   No results for input(s): LIPASE, AMYLASE in the last 168 hours. No results for input(s): AMMONIA in the last 168 hours. CBC:  Recent Labs Lab 02/24/16 1206 02/25/16 0741 02/26/16 1015 02/27/16 1108 02/28/16 0515  WBC 3.7* 4.5 6.1  --  5.4  HGB 9.5* 9.0* 9.3* 11.6* 8.9*  HCT 30.3* 28.1* 29.6* 34.0* 28.4*  MCV 110.2* 108.9* 110.0*  --  109.2*  PLT 221 222 181   --  216   Cardiac Enzymes: No results for input(s): CKTOTAL, CKMB, CKMBINDEX, TROPONINI in the last 168 hours. BNP: BNP (last 3 results) No results for input(s): BNP in the last 8760 hours.  ProBNP (last 3 results) No results for input(s): PROBNP in the last 8760 hours.  CBG: No results for input(s): GLUCAP in the  last 168 hours.  Discussed with patient's daughter Ms. Reene Channing via phone. Updated care and answered questions.   Signed:  Vernell Leep, MD, FACP, FHM. [facility] Pager [phone]  If 7PM-7AM, please contact night-coverage [email or web address] Password [email or web address] 03/01/2016, 12:45 PM

## 2016-03-01 NOTE — Clinical Social Work Note (Signed)
Per MD patient ready to DC back to [facility]. RN, patient/family Evaline Waltman Art and Bobby Rumpf), and facility notified of patient's DC. RN given number for report. DC packet on patient's chart. Ambulance transport will be request by patient's RN once patient returns from HD (Numbers provided on transport form). CSW signing off at this time.   Liz Beach MSW, [place], [place], JI:[id number]

## 2016-03-01 NOTE — Clinical Social Work Note (Signed)
[name] has notified CSW that they are now able to accept the patient back. CSW has made multiple attempts to make daughter aware of this information as the patient is likely discharging today. Patient will return to [facility] at time of discharge unless daughter notifies CSW of alternative facility choice. CSW will assist with DC.   Liz Beach MSW, [place], [place], JI:[id number]

## 2016-03-01 NOTE — Clinical Social Work Note (Signed)
CSW made contact with patient's brother. He states the daughter is working. Brother confirms that plan is to return to [facility] at discharge. Brother states that he will make sure daughter is aware of discharge back to [place].    Liz Beach MSW, [place], [place], [id number]

## 2016-03-01 NOTE — Discharge Instructions (Signed)
Endocarditis Endocarditis is an infection of the inner layer of the heart (endocardium) or of the heart valves. Endocarditis can cause growths inside the heart or on the heart valves. These growths can destroy heart tissue and cause heart failure over time. They can also cause stroke if they break away and form a blood clot in the brain. CAUSES  Endocarditis is caused by germs that normally live in or on your body. The germs that most commonly cause endocarditis are bacteria, but fungi can also cause endocarditis. RISK FACTORS Risk factors include:  Having a heart defect.  Having artificial (prosthetic) heart valves.  Having an abnormal or damaged heart valve.  Having a history of endocarditis.  Having had a heart transplant. SIGNS AND SYMPTOMS Signs and symptoms may start suddenly, or they may start slowly and gradually get worse. Symptoms include:  Fever.  Chills.  Night sweats.  Muscle aches.  Fatigue.  Weakness.  Shortness of breath. Signs include:  An abnormal heart sound (murmur).  Retinal bleeding.  Bleeding under the nails of your fingers or toes.  Painless red spots on your palms.  Painful lumps in your fingertips or toes.  Swelling in your feet or ankles. DIAGNOSIS  To make a diagnosis, your health care provider may:  Perform a physical exam. During the exam he or she will listen to your heart to check for a murmur. He or she may also use a scope to check for bleeding in your retinas.  Order tests. They may include:  Blood tests to look for the germs that cause endocarditis.  An echocardiogram to create an image of your heart. TREATMENT Early treatment offers the best chance for curing endocarditis and preventing complications. Treatment depends on the cause of the endocarditis. Treatment may include:  Antibiotic medicines. These may be given through an IV tube or taken orally.  Surgery to replace your heart valve. You may need surgery if:  The  endocarditis does not respond to treatment.  You develop complications.  Your heart valve is severely damaged. HOME CARE INSTRUCTIONS  Take your antibiotic as directed by your health care provider. Finish the antibiotic even if you start to feel better.  Gradually resume your usual activities.  Let your health care provider know before you have any dental or surgical procedures. You may need to take antibiotics before the procedure.  Let all your health care providers, including your dentist, know that you have had endocarditis.  Do not get tattoos or body piercings.  Do not use IV drugs unless it is part of your medical treatment.  Practice good oral hygiene. This includes:  Brushing and flossing regularly.  Scheduling routine dental appointments. SEEK MEDICAL CARE IF:  You have a fever.  Your symptoms do not improve.  Your symptoms get worse.  Your symptoms come back. SEEK IMMEDIATE MEDICAL CARE IF:  You have trouble breathing.  You have chest pain.  You have symptoms of stroke. These include:  Sudden weakness.  Numbness.  Confusion.  Trouble talking.  A severe headache.   This information is not intended to replace advice given to you by your health care provider. Make sure you discuss any questions you have with your health care provider.   Document Released: 09/17/2005 Document Revised: 10/08/2014 Document Reviewed: 05/04/2014 Elsevier Interactive Patient Education [employer].

## 2016-03-02 ENCOUNTER — Other Ambulatory Visit: Payer: Self-pay

## 2016-03-02 LAB — CULTURE, BLOOD (ROUTINE X 2)
Culture: NO GROWTH
Culture: NO GROWTH

## 2016-03-02 LAB — CULTURE, BODY FLUID W GRAM STAIN -BOTTLE

## 2016-03-02 LAB — CULTURE, BODY FLUID-BOTTLE: CULTURE: NO GROWTH

## 2016-03-02 MED ORDER — HYDROCODONE-ACETAMINOPHEN 5-325 MG PO TABS: 1.0000 | ORAL_TABLET | Freq: Four times a day (QID) | ORAL | Status: DC | PRN

## 2016-03-02 NOTE — Telephone Encounter (Signed)
Prescription request was received from:    [facility] [address] [place] [address]  Phone: [phone] Fax: [phone] 

## 2016-03-05 ENCOUNTER — Non-Acute Institutional Stay (SKILLED_NURSING_FACILITY): Payer: Medicare Other | Admitting: Internal Medicine

## 2016-03-05 ENCOUNTER — Encounter: Payer: Self-pay | Admitting: Internal Medicine

## 2016-03-05 DIAGNOSIS — F411 Generalized anxiety disorder: Secondary | ICD-10-CM | POA: Diagnosis not present

## 2016-03-05 DIAGNOSIS — I10 Essential (primary) hypertension: Secondary | ICD-10-CM

## 2016-03-05 DIAGNOSIS — B951 Streptococcus, group B, as the cause of diseases classified elsewhere: Secondary | ICD-10-CM | POA: Diagnosis not present

## 2016-03-05 DIAGNOSIS — R197 Diarrhea, unspecified: Secondary | ICD-10-CM | POA: Diagnosis not present

## 2016-03-05 DIAGNOSIS — Z992 Dependence on renal dialysis: Secondary | ICD-10-CM | POA: Diagnosis not present

## 2016-03-05 DIAGNOSIS — E43 Unspecified severe protein-calorie malnutrition: Secondary | ICD-10-CM | POA: Diagnosis not present

## 2016-03-05 DIAGNOSIS — D630 Anemia in neoplastic disease: Secondary | ICD-10-CM

## 2016-03-05 DIAGNOSIS — I330 Acute and subacute infective endocarditis: Secondary | ICD-10-CM

## 2016-03-05 DIAGNOSIS — C9000 Multiple myeloma not having achieved remission: Secondary | ICD-10-CM

## 2016-03-05 DIAGNOSIS — R7881 Bacteremia: Secondary | ICD-10-CM | POA: Diagnosis not present

## 2016-03-05 DIAGNOSIS — M00062 Staphylococcal arthritis, left knee: Secondary | ICD-10-CM

## 2016-03-05 DIAGNOSIS — N186 End stage renal disease: Secondary | ICD-10-CM | POA: Diagnosis not present

## 2016-03-05 NOTE — Progress Notes (Signed)
DATE: 03/05/16  Location:  [facility]  Nursing Home Room Number: 304 A Place of Service: SNF (31)   Extended Emergency Contact Information Primary Emergency Contact: Wilburn Mylar [place] Phone: [phone] Mobile Phone: [phone] Relation: Daughter Secondary Emergency Contact: Odie Sera [place] of [place] Mobile Phone: [phone] Relation: Brother  Advanced Directive information  FULL CODE  Chief Complaint  Patient presents with  . Readmit To SNF    HPI:  75 yo female seen today as a readmission into SNF following hospital stay for bacterial endocarditis, left knee staph infection, grp B strep bacteremia, fever, hyperkalemia, anemia in neoplastic disease, HTN, ESRD/HD, multiple myeloma. She presented to the ED with left shoulder/knee pain. BP 171/151-->cardene gtt. She was r/o for acute CVA. She was found to be septic. ID consulted for fevers. Left knee I&D on 5/29th. TEE (+) native mitral valve endocarditis. On cefazolin IV with HD --> 6/24th. H/O consulted and states that MM refractory with poor prognosis. Daughter did not want palliative care during admission. She presents back to SNF for rehab.  Today, she c/o pain in left knee and also has gas pain with frequent foul smelling BMs. She goes to HD on TThSa. She is a poor historian due to increased anxiety/psych d/o. She is crying out during exam. Hx obtained from chart  Insomnia - stable on ambien  ESRD on HD - via left arm AVF on TThSa. followed by nephrology; takes renavite daily; fosrenal 2 gm prior to meals and for Hyperparathyroidism she takes sensipar 60 mg daily   Hypertension - BP stable on norvasc 5 mg daily; apresoline 10 mg three times daily; imdur 60 mg daily   GERD - stable on protonix 40 mg daily and mylicon 638 mg with meals; zantac 300 mg nightly. Has compazine and zofran prn  Multiple myeloma - presently not on active therapy. Is taking remeron 15 mg  nightly to help with her appetite. Has vicodin 5/325 mg 1 or 2 tabs every 6 hours as needed. Followed by oncology. Recent ANC 1.6K; platelets 216K; WBC 5.4K. Hgb 8.9. She gets nutritional supplements. She takes valtrex Larissa.Grist    Past Medical History  Diagnosis Date  . ESRD (end stage renal disease) ([place]) 03/11/2014  . Hypertension associatd with end stage renal disease on dialysis 03/11/2014  . GERD (gastroesophageal reflux disease) 04/23/2015  . Closed fracture of right distal femur ([place]) 09/01/2015  . Multiple myeloma ([place]) 03/11/2014  . Malnutrition of moderate degree 09/02/2015    Past Surgical History  Procedure Laterality Date  . Abdominal hysterectomy      Pt. denies  . I&d extremity Left 02/27/2016    Procedure: ARTHROSCOPIC IRRIGATION AND DEBRIDEMENT EXTREMITY;  Surgeon: Renette Butters, MD;  Location: [facility];  Service: Orthopedics;  Laterality: Left;    Patient Care Team: Benito Mccreedy, MD as PCP - General (Internal Medicine)  Social History   Social History  . Marital Status: Married    Spouse Name: N/A  . Number of Children: 3  . Years of Education: N/A   Occupational History  . Retired      Pharmacist, hospital asst.   Social History Main Topics  . Smoking status: Never Smoker   . Smokeless tobacco: Never Used  . Alcohol Use: No  . Drug Use: No  . Sexual Activity: No   Other Topics Concern  . Not on file   Social History Narrative   Patient recently moved to [place] from  [place] in May 2015.   [name] was born in [place], [place].   Patient has brother in [place] area.     reports that she has never smoked. She has never used smokeless tobacco. She reports that she does not drink alcohol or use illicit drugs.  Family History  Problem Relation Age of Onset  . Hypertension Mother   . Hypertension Father    Family Status  Relation Status Death Age  . Mother Deceased 71  . Father Deceased 31    Immunization History  Administered Date(s) Administered  .  Influenza,inj,Quad PF,36+ Mos 07/23/2014  . PPD Test 04/13/2015    No Known Allergies  Medications: Patient's Medications  New Prescriptions   No medications on file  Previous Medications   AMINO ACIDS-PROTEIN HYDROLYS (FEEDING SUPPLEMENT, PRO-STAT SUGAR FREE 64,) LIQD    Take 30 mLs by mouth 2 (two) times daily.   BISACODYL (DULCOLAX) 10 MG SUPPOSITORY    Place 1 suppository (10 mg total) rectally daily as needed for moderate constipation.   CALCIUM ACETATE (PHOSLO) 667 MG CAPSULE    Take 1 capsule (667 mg total) by mouth 3 (three) times daily with meals.   CEFAZOLIN (ANCEF) 2-4 GM/100ML-% IVPB    Inject 100 mLs (2 g total) into the vein Every Tuesday,Thursday,and Saturday with dialysis. End date: 03/24/16.   CINACALCET (SENSIPAR) 60 MG TABLET    Take 60 mg by mouth daily.   HYDROCODONE-ACETAMINOPHEN (NORCO/VICODIN) 5-325 MG TABLET    Take 1 tablet by mouth every 6 (six) hours as needed for moderate pain or severe pain.   ISOSORBIDE MONONITRATE (IMDUR) 60 MG 24 HR TABLET    Take 60 mg by mouth daily.   LANTHANUM (FOSRENOL) 1000 MG CHEWABLE TABLET    Chew 2,000 mg by mouth 3 (three) times daily with meals. Take 2 tablets (=2097m) by mouth AC.   MIRTAZAPINE (REMERON SOL-TAB) 15 MG DISINTEGRATING TABLET    Take 15 mg by mouth. Dissolve 1 tablet by mouth at bedtime for appetite.   MULTIVITAMIN (RENA-VIT) TABS TABLET    Take 1 tablet by mouth daily.   NUTRITIONAL SUPPLEMENTS (FEEDING SUPPLEMENT, NEPRO CARB STEADY,) LIQD    Take 237 mLs by mouth 2 (two) times daily between meals.   ONDANSETRON (ZOFRAN ODT) 4 MG DISINTEGRATING TABLET    Take 1 tablet (4 mg total) by mouth every 8 (eight) hours as needed for nausea or vomiting.   PANTOPRAZOLE (PROTONIX) 40 MG TABLET    TAKE 1 TABLET BY MOUTH DAILY   PROCHLORPERAZINE (COMPAZINE) 10 MG TABLET    Take 1 tablet (10 mg total) by mouth every 6 (six) hours as needed (Nausea or vomiting).   RANITIDINE (ZANTAC) 300 MG TABLET    Take 300 mg by mouth at  bedtime. Reported on 03/05/2016   SIMETHICONE (MYLICON) 1491MG CHEWABLE TABLET    Take 1 tablet by mouth prior to each meal.   VALACYCLOVIR (VALTREX) 500 MG TABLET    Take 1 tablet (500 mg total) by mouth every other day.   ZOLPIDEM (AMBIEN) 5 MG TABLET    Take 1 tablet (5 mg total) by mouth at bedtime as needed for sleep.  Modified Medications   No medications on file  Discontinued Medications   MULTIVITAMIN (RENA-VIT) TABS TABLET    Take 1 tablet by mouth at bedtime.    Review of Systems  Unable to perform ROS: Psychiatric disorder    Filed Vitals:   03/05/16 1210  BP: 125/84  Pulse: 76  Temp: 98 F (36.7 C)  TempSrc: Oral  Resp: 16  Height: '5\' 6"'  (1.676 m)  Weight: 122 lb 12.8 oz (55.702 kg)   Body mass index is 19.83 kg/(m^2).  Physical Exam  Constitutional: She appears cachectic. She appears distressed.  Lying in bed anxious/agitated/crying, Frail appearing  HENT:  Mouth/Throat: Oropharynx is clear and moist. No oropharyngeal exudate.  Eyes: Pupils are equal, round, and reactive to light. No scleral icterus.  Neck: Neck supple. Carotid bruit is not present. No tracheal deviation present.  Cardiovascular: Normal rate, regular rhythm and intact distal pulses.  Exam reveals no gallop and no friction rub.   Murmur (3/6 SEM) heard. Left arm AVF with thrill/audible bruit. Trace BLE edema. No calf TTP b/l. Extensive soft left leg varicose veins, no palpable nodules  Pulmonary/Chest: Effort normal and breath sounds normal. No stridor. No respiratory distress. She has no wheezes. She has no rales.  Abdominal: Soft. She exhibits no distension and no mass. Bowel sounds are increased. There is no hepatomegaly. There is no tenderness. There is no rebound and no guarding.  Musculoskeletal: She exhibits edema.  Left knee wrapped and elevated  Lymphadenopathy:    She has no cervical adenopathy.  Neurological: She is alert.  AO x 2 in NAD  Skin: Skin is warm and dry. No rash noted.    Psychiatric: Her mood appears anxious. She is agitated. She is not actively hallucinating. Thought content is not delusional.     Labs reviewed: Admission on 02/17/2016, Discharged on 03/01/2016  No results displayed because visit has over 200 results.         Dg Chest 2 View  02/18/2016  CLINICAL DATA:  Fever. History of end-stage renal disease on dialysis, multiple myeloma. EXAM: CHEST  2 VIEW COMPARISON:  Chest radiograph September 03, 2015 FINDINGS: Cardiac silhouette is moderately enlarged. Tortuous calcified aorta. No pleural effusion or focal consolidation. Mildly elevated LEFT hemidiaphragm with strandy densities. No pneumothorax. Vascular clips project in LEFT humerus soft tissues. Patient's LEFT arm at side, limiting assessment of thoracic spine. IMPRESSION: Stable cardiomegaly.  LEFT lung base atelectasis. Electronically Signed   By: Elon Alas M.D.   On: 02/18/2016 02:31   Ct Head Wo Contrast  02/17/2016  CLINICAL DATA:  Left arm pain, hypertensive, weakness, weight loss. EXAM: CT HEAD WITHOUT CONTRAST TECHNIQUE: Contiguous axial images were obtained from the base of the skull through the vertex without intravenous contrast. COMPARISON:  Head CT dated 04/10/2015. FINDINGS: Brain: There is mild generalized age-related brain atrophy with commensurate dilatation of the ventricles and sulci. Mild chronic small vessel ischemic changes noted within the deep periventricular white matter. Small old lacunar infarcts noted within the basal ganglia. There is no mass, hemorrhage, edema or other evidence of acute parenchymal abnormality. No extra-axial hemorrhage. Vascular: No hyperdense vessel or unexpected calcification. There are chronic calcified atherosclerotic changes of the large vessels at the skull base. Skull: Negative for fracture or focal lesion. Sinuses/Orbits: Visualized upper paranasal sinuses are clear. Mastoid air cells are clear. Other: None. IMPRESSION: No acute findings.   No intracranial mass, hemorrhage or edema. Electronically Signed   By: Franki Cabot M.D.   On: 02/17/2016 21:58   Dg Shoulder Left  02/18/2016  CLINICAL DATA:  Pt states she has been having left arm pain and weakness since her dialysis needle was removed on Thursday. Hx multiple myeloma. EXAM: LEFT SHOULDER - 2+ VIEW COMPARISON:  None. FINDINGS: No fracture.  No bone lesion. Glenohumeral joint and AC joints  are normally aligned. No significant arthropathic change. Bones are demineralized. Surgical vascular clips project along the medial left arm. IMPRESSION: No fracture or dislocation.  No acute finding.  No bone lesion. Electronically Signed   By: Lajean Manes M.D.   On: 02/18/2016 10:31   Dg Knee Complete 4 Views Left  02/17/2016  CLINICAL DATA:  Left knee pain and swelling.  No reported injury. EXAM: LEFT KNEE - COMPLETE 4+ VIEW COMPARISON:  09/03/2015 left knee radiographs FINDINGS: Moderate suprapatellar left knee joint effusion. A 1 cm round calcification medial and anterior to the left distal femoral shaft may represent a loose intra-articular body (this structure was seen lateral to the left distal femur on the 09/03/2015 radiographs). No fracture, dislocation or suspicious focal osseous lesion. Diffuse osteopenia. Mild tricompartmental left knee osteoarthritis. No appreciable joint erosions. Vascular calcifications throughout the soft tissues posteriorly. IMPRESSION: 1. New moderate suprapatellar left knee joint effusion. 2. Probable 1 cm loose intra-articular osseous body in the suprapatellar left knee joint. 3. Mild tricompartmental osteoarthritis in the left knee. No left knee joint erosions. 4. Diffuse osteopenia. Electronically Signed   By: Ilona Sorrel M.D.   On: 02/17/2016 21:41   Dg Fluoro Guided Needle Plc Aspiration/injection Loc  02/26/2016  CLINICAL DATA:  Left knee pain, swelling and increased warmth. Moderate-sized left knee joint effusion on recent radiographs. Sepsis. Clinical  concern for infected knee. EXAM: FLUORO GUIDED NEEDLE PLACEMENT CONTRAST:  None used. FLUOROSCOPY TIME:  Radiation Exposure Index (as provided by the fluoroscopic device): Not applicable If the device does not provide the exposure index: Fluoroscopy Time (in minutes and seconds):  0 minutes 1 second Number of Acquired Images:  1 screen save COMPARISON:  Left knee radiographs dated 02/17/2016. FINDINGS: Using sterile technique, 1% lidocaine and fluoroscopic guidance, an 18 gauge spinal needle was inserted into the patellofemoral joint space using a lateral approach. 30 cc of clear, reddish fluid was withdrawn and sent to the laboratory for testing. The patient tolerated the procedure well with no immediate complications. IMPRESSION: Successful fluoroscopic guided left knee needle aspiration. Electronically Signed   By: Claudie Revering M.D.   On: 02/26/2016 14:02     Assessment/Plan   ICD-9-CM ICD-10-CM   1. Diarrhea, unspecified type r/o C diff infection 787.91 R19.7   2. Bacterial endocarditis 421.0 I33.0   3. Staphylococcal arthritis of left knee (HCC) 711.06 M00.062    041.10    4. Bacteremia due to group B Streptococcus 790.7 R78.81    041.02 B95.1   5. Anemia in neoplastic disease 285.22 D63.0   6. ESRD on dialysis ([place]) 585.6 N18.6    V45.11 Z99.2   7. Multiple myeloma not having achieved remission (HCC) 203.00 C90.00   8. Protein-calorie malnutrition, severe ([place]) 262 E43   9. Essential hypertension 401.9 I10   10.    Anxiety state  Follow blood cx  Follow cx on left knee synovial fluid  F/u with Ortho Dr Percell Miller in 2 weeks; ID in 4-5 weeks  May need f/u with cardiology  Check stool for C diff toxin  Follow CBC to be drawn at HD  F/u with HD as scheduled TThSa  PT/OT/ST as ordered  Cont abx as scheduled at HD thru 6/24th  Cont other meds as ordered  May need psych services  GOAL: short term rehab with potential for long term care. Prognosis overall poor due to refractory  MM. Communicated with pt and nursing.  Will follow  Jalaiyah Throgmorton S. Eulas Post, D. Jenetta Downer., F. A.  Suzzanne Cloud  [facility] [place], [place] [address] [phone] Cell (Monday-Friday 8 AM - 5 PM) [phone] After 5 PM and follow prompts

## 2016-03-06 ENCOUNTER — Encounter (HOSPITAL_COMMUNITY): Payer: Self-pay | Admitting: Cardiovascular Disease

## 2016-03-12 ENCOUNTER — Observation Stay (HOSPITAL_COMMUNITY)
Admission: EM | Admit: 2016-03-12 | Discharge: 2016-03-14 | Disposition: A | Discharge status: Home / Self Care | Payer: Medicare Other | Attending: Internal Medicine | Admitting: Internal Medicine

## 2016-03-12 ENCOUNTER — Encounter (HOSPITAL_COMMUNITY): Payer: Self-pay | Admitting: Emergency Medicine

## 2016-03-12 DIAGNOSIS — I10 Essential (primary) hypertension: Secondary | ICD-10-CM | POA: Diagnosis present

## 2016-03-12 DIAGNOSIS — I38 Endocarditis, valve unspecified: Secondary | ICD-10-CM

## 2016-03-12 DIAGNOSIS — N186 End stage renal disease: Secondary | ICD-10-CM | POA: Diagnosis not present

## 2016-03-12 DIAGNOSIS — I120 Hypertensive chronic kidney disease with stage 5 chronic kidney disease or end stage renal disease: Principal | ICD-10-CM

## 2016-03-12 DIAGNOSIS — R197 Diarrhea, unspecified: Secondary | ICD-10-CM | POA: Diagnosis present

## 2016-03-12 DIAGNOSIS — D630 Anemia in neoplastic disease: Secondary | ICD-10-CM | POA: Diagnosis present

## 2016-03-12 DIAGNOSIS — D649 Anemia, unspecified: Secondary | ICD-10-CM | POA: Diagnosis present

## 2016-03-12 DIAGNOSIS — C9000 Multiple myeloma not having achieved remission: Secondary | ICD-10-CM | POA: Diagnosis present

## 2016-03-12 DIAGNOSIS — Z992 Dependence on renal dialysis: Secondary | ICD-10-CM | POA: Insufficient documentation

## 2016-03-12 DIAGNOSIS — E871 Hypo-osmolality and hyponatremia: Secondary | ICD-10-CM | POA: Diagnosis present

## 2016-03-12 LAB — CBC WITH DIFFERENTIAL/PLATELET
BASOS ABS: 0 10*3/uL (ref 0.0–0.1)
BASOS PCT: 0 %
EOS PCT: 2 %
Eosinophils Absolute: 0.1 10*3/uL (ref 0.0–0.7)
HCT: 19.5 % — ABNORMAL LOW (ref 36.0–46.0)
HEMOGLOBIN: 6.1 g/dL — AB (ref 12.0–15.0)
LYMPHS PCT: 16 %
Lymphs Abs: 0.7 10*3/uL (ref 0.7–4.0)
MCH: 34.1 pg — AB (ref 26.0–34.0)
MCHC: 31.3 g/dL (ref 30.0–36.0)
MCV: 108.9 fL — ABNORMAL HIGH (ref 78.0–100.0)
MONO ABS: 0.5 10*3/uL (ref 0.1–1.0)
MONOS PCT: 11 %
NEUTROS ABS: 3 10*3/uL (ref 1.7–7.7)
Neutrophils Relative %: 70 %
Platelets: 236 10*3/uL (ref 150–400)
RBC: 1.79 MIL/uL — AB (ref 3.87–5.11)
RDW: 13.6 % (ref 11.5–15.5)
WBC: 4.3 10*3/uL (ref 4.0–10.5)

## 2016-03-12 LAB — COMPREHENSIVE METABOLIC PANEL
ALBUMIN: 1.7 g/dL — AB (ref 3.5–5.0)
ALK PHOS: 68 U/L (ref 38–126)
AST: 18 U/L (ref 15–41)
Anion gap: 8 (ref 5–15)
BUN: 28 mg/dL — AB (ref 6–20)
CALCIUM: 7.8 mg/dL — AB (ref 8.9–10.3)
CO2: 29 mmol/L (ref 22–32)
CREATININE: 5.75 mg/dL — AB (ref 0.44–1.00)
Chloride: 86 mmol/L — ABNORMAL LOW (ref 101–111)
GFR calc non Af Amer: 6 mL/min — ABNORMAL LOW (ref >60)
GFR, EST AFRICAN AMERICAN: 7 mL/min — AB (ref >60)
GLUCOSE: 83 mg/dL (ref 65–99)
Potassium: 4.7 mmol/L (ref 3.5–5.1)
SODIUM: 123 mmol/L — AB (ref 135–145)
Total Bilirubin: 0.5 mg/dL (ref 0.3–1.2)
Total Protein: 6.8 g/dL (ref 6.5–8.1)

## 2016-03-12 LAB — PREPARE RBC (CROSSMATCH)

## 2016-03-12 MED ORDER — SODIUM CHLORIDE 0.9 % IV SOLN
Freq: Once | INTRAVENOUS | Status: AC
  Administered 2016-03-12: 19:00:00 via INTRAVENOUS

## 2016-03-12 NOTE — ED Provider Notes (Signed)
CSN: [id number]     Arrival date & time 03/12/16  1642 History   First MD Initiated Contact with Patient 03/12/16 1645     Chief Complaint  Patient presents with  . Abnormal Lab     (Consider location/radiation/quality/duration/timing/severity/associated sxs/prior Treatment) HPI.Marland KitchenMarland KitchenMarland KitchenPatient sent [place] [facility] [place]. I do not have the current blood work available to me. She has no specific complaints. Past medical history includes malnutrition, multiple myeloma, end-stage renal disease with dialysis on Tuesday Thursday Saturday. She has no specific complaints of chest pain, dyspnea, weakness.  Past Medical History  Diagnosis Date  . ESRD (end stage renal disease) ([place]) 03/11/2014  . Hypertension associatd with end stage renal disease on dialysis 03/11/2014  . GERD (gastroesophageal reflux disease) 04/23/2015  . Closed fracture of right distal femur ([place]) 09/01/2015  . Multiple myeloma ([place]) 03/11/2014  . Malnutrition of moderate degree 09/02/2015   Past Surgical History  Procedure Laterality Date  . Abdominal hysterectomy      Pt. denies  . I&d extremity Left 02/27/2016    Procedure: ARTHROSCOPIC IRRIGATION AND DEBRIDEMENT EXTREMITY;  Surgeon: Renette Butters, MD;  Location: [facility];  Service: Orthopedics;  Laterality: Left;  . Tee without cardioversion N/A 02/29/2016    Procedure: TRANSESOPHAGEAL ECHOCARDIOGRAM (TEE);  Surgeon: Thayer Headings, MD;  [facility] END[facility]COPY;  Service: Cardiovascular;  Laterality: N/A;   Family History  Problem Relation Age of Onset  . Hypertension Mother   . Hypertension Father    Social History  Substance Use Topics  . Smoking status: Never Smoker   . Smokeless tobacco: Never Used  . Alcohol Use: No   OB History    No data available     Review of Systems  All other systems reviewed and are negative.     Allergies  Review of patient's allergies indicates no known allergies.  Home Medications   Prior to Admission  medications   Medication Sig Start Date End Date Taking? Authorizing Provider  Amino Acids-Protein Hydrolys (FEEDING SUPPLEMENT, PRO-STAT SUGAR FREE 64,) LIQD Take 30 mLs by mouth 2 (two) times daily.   Yes Historical Provider, MD  bisacodyl (DULCOLAX) 10 MG suppository Place 1 suppository (10 mg total) rectally daily as needed for moderate constipation. 09/07/15  Yes Theodis Blaze, MD  calcium acetate (PHOSLO) 667 MG capsule Take 1 capsule (667 mg total) by mouth 3 (three) times daily with meals. 03/01/16  Yes Modena Jansky, MD  cinacalcet (SENSIPAR) 60 MG tablet Take 60 mg by mouth daily.   Yes Historical Provider, MD  HYDROcodone-acetaminophen (NORCO/VICODIN) 5-325 MG tablet Take 1 tablet by mouth every 6 (six) hours as needed for moderate pain or severe pain. 03/02/16  Yes Tiffany L Reed, DO  isosorbide mononitrate (IMDUR) 60 MG 24 hr tablet Take 60 mg by mouth daily. 03/24/15  Yes Historical Provider, MD  lanthanum (FOSRENOL) 1000 MG chewable tablet Chew 1,000 mg by mouth 2 (two) times daily with a meal.   Yes Historical Provider, MD  mirtazapine (REMERON SOL-TAB) 15 MG disintegrating tablet Take 15 mg by mouth. Dissolve 1 tablet by mouth at bedtime for appetite.   Yes Historical Provider, MD  multivitamin (RENA-VIT) TABS tablet Take 1 tablet by mouth daily.   Yes Historical Provider, MD  Nutritional Supplements (FEEDING SUPPLEMENT, NEPRO CARB STEADY,) LIQD Take 237 mLs by mouth 2 (two) times daily between meals. 03/01/16  Yes Modena Jansky, MD  ondansetron (ZOFRAN ODT) 4 MG disintegrating tablet Take 1 tablet (4  mg total) by mouth every 8 (eight) hours as needed for nausea or vomiting. 01/21/15  Yes Everlene Balls, MD  pantoprazole (PROTONIX) 40 MG tablet TAKE 1 TABLET BY MOUTH DAILY 09/06/15  Yes Boykin Nearing, MD  prochlorperazine (COMPAZINE) 10 MG tablet Take 1 tablet (10 mg total) by mouth every 6 (six) hours as needed (Nausea or vomiting). 03/25/14  Yes Concha Norway, MD  promethazine (PHENERGAN)  25 MG tablet Take 25 mg by mouth every 6 (six) hours as needed for nausea or vomiting.   Yes Historical Provider, MD  ranitidine (ZANTAC) 300 MG tablet Take 300 mg by mouth at bedtime. Reported on 03/05/2016   Yes Historical Provider, MD  simethicone (MYLICON) 761 MG chewable tablet Take 1 tablet by mouth prior to each meal.   Yes Historical Provider, MD  valACYclovir (VALTREX) 500 MG tablet Take 1 tablet (500 mg total) by mouth every other day. 03/01/16  Yes Modena Jansky, MD  zolpidem (AMBIEN) 5 MG tablet Take 1 tablet (5 mg total) by mouth at bedtime as needed for sleep. 12/01/15  Yes Tiffany L Reed, DO  ceFAZolin (ANCEF) 2-4 GM/100ML-% IVPB Inject 100 mLs (2 g total) into the vein Every Tuesday,Thursday,and Saturday with dialysis. End date: 03/24/16. Patient not taking: Reported on 03/12/2016 03/01/16 03/24/16  Modena Jansky, MD  lanthanum (FOSRENOL) 1000 MG chewable tablet Chew 2,000 mg by mouth 3 (three) times daily with meals. Reported on 03/12/2016    Historical Provider, MD   BP 120/98 mmHg  Pulse 65  Temp(Src) 98.5 F (36.9 C) (Oral)  Resp 23  Ht '5\' 7"'  (1.702 m)  SpO2 100% Physical Exam  Constitutional: She is oriented to person, place, and time. She appears well-developed and well-nourished.  HENT:  Head: Normocephalic and atraumatic.  Eyes: Conjunctivae and EOM are normal. Pupils are equal, round, and reactive to light.  Neck: Normal range of motion. Neck supple.  Cardiovascular: Normal rate and regular rhythm.   Pulmonary/Chest: Effort normal and breath sounds normal.  Abdominal: Soft. Bowel sounds are normal.  Genitourinary:  Rectal exam: No masses.  No obvious blood on stool.  Hemoccult pending  Musculoskeletal: Normal range of motion.  Neurological: She is alert and oriented to person, place, and time.  Skin: Skin is warm and dry.  Psychiatric: She has a normal mood and affect. Her behavior is normal.  Nursing note and vitals reviewed.   ED Course  Procedures (including  critical care time) Labs Review Labs Reviewed  CBC WITH DIFFERENTIAL/PLATELET - Abnormal; Notable for the following:    RBC 1.79 (*)    Hemoglobin 6.1 (*)    HCT 19.5 (*)    MCV 108.9 (*)    MCH 34.1 (*)    All other components within normal limits  COMPREHENSIVE METABOLIC PANEL - Abnormal; Notable for the following:    Sodium 123 (*)    Chloride 86 (*)    BUN 28 (*)    Creatinine, Ser 5.75 (*)    Calcium 7.8 (*)    Albumin 1.7 (*)    ALT <5 (*)    GFR calc non Af Amer 6 (*)    GFR calc Af Amer 7 (*)    All other components within normal limits  POC OCCULT BLOOD, ED  TYPE AND SCREEN  PREPARE RBC (CROSSMATCH)    Imaging Review No results found. I have personally reviewed and evaluated these images and lab results as part of my medical decision-making.   EKG Interpretation None  MDM   Final diagnoses:  Anemia in neoplastic disease  ESRD (end stage renal disease) ([place])    After reviewing chart, I suspect her anemia is secondary to the multiple myeloma. She has not required a transfusion in some time. Will order 2 units of packed cells. Discussed with general medicine    Nat Christen, MD 03/12/16 2024

## 2016-03-12 NOTE — ED Notes (Signed)
Soiled pt cleaned and placed on clean pad.

## 2016-03-12 NOTE — ED Notes (Signed)
Pt here from [facility] with low hgb. Pt denies any symptoms.

## 2016-03-12 NOTE — H&P (Signed)
[name] DGU:[id number] DOB: July 12, 1941 DOA: 03/12/2016     PCP: Benito Mccreedy, MD   Outpatient Specialists:[facility] Trula Slade, oncology Alvy Bimler, nephrology,  Oncology at [facility]: Dr. Carlyle Basques, Infectious disease Patient coming from: From facility [facility]  Chief Complaint: Abnormal labs  HPI: [name] is a 75 y.o. female with medical history significant of ESRD left arm aVF on TThSa, HTN, GERD, multiple myeloma,  Anemia of chronic disease, septic left knee, endocarditis of mitral valve, hyperparathyroidism  Malnutrition  Presented with abnormal labs which was found at the nursing home facility that her hemoglobin has drifted down to 6. From baseline of 8.9 on May 30 time of discharge the hospital. Last Aranesp dose pack and he was on 25th of May. The plan was for patient to undergo transfusion she was supposed to go to short stay but instead and the pain in emergency department. She does endorse feeling weak all over.  A should not endorse any chest pain or shortness of breath no history of blood in stool or melena, no fever no chills Reports diarrhea for the past 2 days. 2 BM' s today loose. Has been on antibiotics with HD Regarding pertinent Chronic problems: Patient has history of septic left knee undergone irrigation and 02/27/2016 by Dr. Percell Miller Patient has history of multiple myeloma currently not an active therapy due to refractory multiple myeloma and has very poor prognosis.  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 Blood cultures were negative Patient has known history of pancytopenia thought to be multifactorial related to multiple myeloma and end-stage renal disease. Thrombocytopenia and leukopenia resolved after patient received chemotherapy  and chronic son 17th of May. Hemoglobin was dropping and Aranesp was resumed on 25th of May  IN ER: Afebrile heart rate 65 blood pressure 10/21/1986 WBC 4.3 hemoglobin 6.1 platelets 236 sodium 123 creatinine 5.75 potassium 4.7   Hospitalist was called for admission for anemia  Review of Systems:    Pertinent positives include:  fatigue,  Constitutional:  No weight loss, night sweats, Fevers, chills, weight loss  HEENT:  No headaches, Difficulty swallowing,Tooth/dental problems,Sore throat,  No sneezing, itching, ear ache, nasal congestion, post nasal drip,  Cardio-vascular:  No chest pain, Orthopnea, PND, anasarca, dizziness, palpitations.no Bilateral lower extremity swelling  GI:  No heartburn, indigestion, abdominal pain, nausea, vomiting, diarrhea, change in bowel habits, loss of appetite, melena, blood in stool, hematemesis Resp:  no shortness of breath at rest. No dyspnea on exertion, No excess mucus, no productive cough, No non-productive cough, No coughing up of blood.No change in color of mucus.No wheezing. Skin:  no rash or lesions. No jaundice GU:  no dysuria, change in color of urine, no urgency or frequency. No straining to urinate.  No flank pain.  Musculoskeletal:  No joint pain or no joint swelling. No decreased range of motion. No back pain.  Psych:  No change in mood or affect. No depression or anxiety. No memory loss.  Neuro: no localizing neurological complaints, no tingling, no weakness, no double vision, no gait abnormality, no slurred speech, no confusion  As per HPI otherwise 10 point review of systems negative.   Past Medical History: Past Medical History  Diagnosis Date  . ESRD (end stage renal disease) ([place]) 03/11/2014  . Hypertension associatd with end stage renal disease  on dialysis 03/11/2014  . GERD (gastroesophageal reflux disease) 04/23/2015  . Closed fracture of right distal femur ([place]) 09/01/2015  . Multiple myeloma ([place]) 03/11/2014  .  Malnutrition of moderate degree 09/02/2015   Past Surgical History  Procedure Laterality Date  . Abdominal hysterectomy      Pt. denies  . I&d extremity Left 02/27/2016    Procedure: ARTHROSCOPIC IRRIGATION AND DEBRIDEMENT EXTREMITY;  Surgeon: Renette Butters, MD;  Location: [place] [facility];  Service: Orthopedics;  Laterality: Left;  . Tee without cardioversion N/A 02/29/2016    Procedure: TRANSESOPHAGEAL ECHOCARDIOGRAM (TEE);  Surgeon: Thayer Headings, MD;  Location: [place];  Service: Cardiovascular;  Laterality: N/A;     Social History:  Ambulatory  wheelchair bound,     reports that she has never smoked. She has never used smokeless tobacco. She reports that she does not drink alcohol or use illicit drugs.  Allergies:  No Known Allergies     Family History:    Family History  Problem Relation Age of Onset  . Hypertension Mother   . Hypertension Father     Medications: Prior to Admission medications   Medication Sig Start Date End Date Taking? Authorizing Provider  Amino Acids-Protein Hydrolys (FEEDING SUPPLEMENT, PRO-STAT SUGAR FREE 64,) LIQD Take 30 mLs by mouth 2 (two) times daily.   Yes Historical Provider, MD  calcium acetate (PHOSLO) 667 MG capsule Take 1 capsule (667 mg total) by mouth 3 (three) times daily with meals. 03/01/16  Yes Modena Jansky, MD  cinacalcet (SENSIPAR) 60 MG tablet Take 60 mg by mouth daily.   Yes Historical Provider, MD  HYDROcodone-acetaminophen (NORCO/VICODIN) 5-325 MG tablet Take 1 tablet by mouth every 6 (six) hours as needed for moderate pain or severe pain. 03/02/16  Yes Tiffany L Reed, DO  isosorbide mononitrate (IMDUR) 60 MG 24 hr tablet Take 60 mg by mouth daily. 03/24/15  Yes Historical Provider, MD  lanthanum (FOSRENOL) 1000 MG chewable tablet Chew 1,000 mg by mouth 2 (two) times daily with a meal.   Yes Historical Provider, MD  mirtazapine (REMERON SOL-TAB) 15 MG disintegrating tablet Take 15 mg by mouth. Dissolve 1 tablet by mouth at  bedtime for appetite.   Yes Historical Provider, MD  multivitamin (RENA-VIT) TABS tablet Take 1 tablet by mouth daily.   Yes Historical Provider, MD  ondansetron (ZOFRAN ODT) 4 MG disintegrating tablet Take 1 tablet (4 mg total) by mouth every 8 (eight) hours as needed for nausea or vomiting. 01/21/15  Yes Everlene Balls, MD  pantoprazole (PROTONIX) 40 MG tablet TAKE 1 TABLET BY MOUTH DAILY 09/06/15  Yes Boykin Nearing, MD  prochlorperazine (COMPAZINE) 10 MG tablet Take 1 tablet (10 mg total) by mouth every 6 (six) hours as needed (Nausea or vomiting). 03/25/14  Yes Concha Norway, MD  promethazine (PHENERGAN) 25 MG tablet Take 25 mg by mouth every 6 (six) hours as needed for nausea or vomiting.   Yes Historical Provider, MD  ranitidine (ZANTAC) 300 MG tablet Take 300 mg by mouth at bedtime. Reported on 03/05/2016   Yes Historical Provider, MD  simethicone (MYLICON) 161 MG chewable tablet Take 1 tablet by mouth prior to each meal.   Yes Historical Provider, MD  valACYclovir (VALTREX) 500 MG tablet Take 1 tablet (500 mg total) by mouth every other day. 03/01/16  Yes Modena Jansky, MD  zolpidem (AMBIEN) 5 MG tablet Take 1 tablet (5 mg total) by mouth at bedtime as needed for sleep. 12/01/15  Yes Dana,  DO  bisacodyl (DULCOLAX) 10 MG suppository Place 1 suppository (10 mg total) rectally daily as needed for moderate constipation. 09/07/15   Theodis Blaze, MD  ceFAZolin (ANCEF) 2-4 GM/100ML-% IVPB Inject 100 mLs (2 g total) into the vein Every Tuesday,Thursday,and Saturday with dialysis. End date: 03/24/16. 03/01/16 03/24/16  Modena Jansky, MD  lanthanum (FOSRENOL) 1000 MG chewable tablet Chew 2,000 mg by mouth 3 (three) times daily with meals. Reported on 03/12/2016    Historical Provider, MD  Nutritional Supplements (FEEDING SUPPLEMENT, NEPRO CARB STEADY,) LIQD Take 237 mLs by mouth 2 (two) times daily between meals. 03/01/16   Modena Jansky, MD    Physical Exam: Patient Vitals for the past 24 hrs:  BP  Temp Temp src Pulse Resp SpO2 Height  03/12/16 1945 120/76 mmHg 98.5 F (36.9 C) Oral 62 15 100 % -  03/12/16 1930 123/84 mmHg - - (!) 50 17 100 % -  03/12/16 1923 - 98.3 F (36.8 C) Oral - - - -  03/12/16 1920 133/86 mmHg - - 63 22 100 % -  03/12/16 1830 126/71 mmHg - - 60 16 100 % -  03/12/16 1800 141/99 mmHg - - 63 16 100 % -  03/12/16 1712 (!) 133/112 mmHg 98.5 F (36.9 C) Oral 60 18 100 % '5\' 7"'$  (1.702 m)  03/12/16 1643 - - - - - 97 % -    1. General:  in No Acute distress 2. Psychological: Alert and  Oriented 3. Head/ENT:   Dry Mucous Membranes                          Head Non traumatic, neck supple                            Poor Dentition 4. SKIN:   decreased Skin turgor,  Skin clean Dry and intact no rash 5. Heart: Regular rate and rhythm, Murmur, no Rub or gallop 6. Lungs:   no wheezes or crackles   7. Abdomen: Soft, non-tender, Non distended 8. Lower extremities: no clubbing, cyanosis, or edema 9. Neurologically Grossly intact, moving all 4 extremities equally 10. MSK: Normal range of motion   body mass index is unknown because there is no weight on file.  Labs on Admission:   Labs on Admission: I have personally reviewed following labs and imaging studies  CBC:  Recent Labs Lab 03/12/16 1746  WBC 4.3  NEUTROABS 3.0  HGB 6.1*  HCT 19.5*  MCV 108.9*  PLT 026   Basic Metabolic Panel:  Recent Labs Lab 03/12/16 1746  NA 123*  K 4.7  CL 86*  CO2 29  GLUCOSE 83  BUN 28*  CREATININE 5.75*  CALCIUM 7.8*   GFR: Estimated Creatinine Clearance: 7.3 mL/min (by C-G formula based on Cr of 5.75). Liver Function Tests:  Recent Labs Lab 03/12/16 1746  AST 18  ALT <5*  ALKPHOS 68  BILITOT 0.5  PROT 6.8  ALBUMIN 1.7*   No results for input(s): LIPASE, AMYLASE in the last 168 hours. No results for input(s): AMMONIA in the last 168 hours. Coagulation Profile: No results for input(s): INR, PROTIME in the last 168 hours. Cardiac Enzymes: No results  for input(s): CKTOTAL, CKMB, CKMBINDEX, TROPONINI in the last 168 hours. BNP (last 3 results) No results for input(s): PROBNP in the last 8760 hours. HbA1C: No results for input(s): HGBA1C in the last 72 hours.  CBG: No results for input(s): GLUCAP in the last 168 hours. Lipid Profile: No results for input(s): CHOL, HDL, LDLCALC, TRIG, CHOLHDL, LDLDIRECT in the last 72 hours. Thyroid Function Tests: No results for input(s): TSH, T4TOTAL, FREET4, T3FREE, THYROIDAB in the last 72 hours. Anemia Panel: No results for input(s): VITAMINB12, FOLATE, FERRITIN, TIBC, IRON, RETICCTPCT in the last 72 hours. Urine analysis:    Component Value Date/Time   COLORURINE YELLOW 02/18/2016 0130   APPEARANCEUR CLEAR 02/18/2016 0130   LABSPEC 1.010 02/18/2016 0130   PHURINE 8.0 02/18/2016 0130   GLUCOSEU 100* 02/18/2016 0130   HGBUR MODERATE* 02/18/2016 0130   BILIRUBINUR NEGATIVE 02/18/2016 0130   KETONESUR NEGATIVE 02/18/2016 0130   PROTEINUR 100* 02/18/2016 0130   UROBILINOGEN 0.2 04/10/2015 1800   NITRITE NEGATIVE 02/18/2016 0130   LEUKOCYTESUR SMALL* 02/18/2016 0130   Sepsis Labs: _0 (procalcitonin:4,lacticidven:4) )No results found for this or any previous visit (from the past 240 hour(s)).     UA not obtained  No results found for: HGBA1C  Estimated Creatinine Clearance: 7.3 mL/min (by C-G formula based on Cr of 5.75).  BNP (last 3 results) No results for input(s): PROBNP in the last 8760 hours.   ECG REPORT Not obtained  There were no vitals filed for this visit.   Cultures:    Component Value Date/Time   SDES FLUID KNEE LEFT 02/26/2016 1340   SDES FLUID KNEE LEFT 02/26/2016 1340   SPECREQUEST Immunocompromised 02/26/2016 1340   SPECREQUEST NONE 02/26/2016 1340   CULT NO GROWTH 5 DAYS 02/26/2016 1340   REPTSTATUS 02/26/2016 FINAL 02/26/2016 1340   REPTSTATUS 03/02/2016 FINAL 02/26/2016 1340     Radiological Exams on Admission: No results found.  Chart has been  reviewed    Assessment/Plan  75 y.o. female with medical history significant of ESRD left arm aVF on TThSa, HTN, GERD, multiple myeloma,  Anemia of chronic disease, septic left knee, endocarditis of mitral valve, hyperparathyroidism,  Malnutrition Admitted for symptomatic anemia admitted for blood transfusion  Present on Admission:  . Anemia in neoplastic disease - transfuse 2 units, likely multifactorial due to Multiple Meyloma And end-stage renal disease  . ESRD (end stage renal disease) ([place]) - left message to nephrology regarding patient needed dialysis tomorrow we will continue antibiotics with hemodialysis  . Essential hypertension - continue home medications  . Multiple myeloma (H[facility]) - poor prognosis, anemia most likely secondary to bone marrow suppression. Would let oncology  patient is here  . Hyponatremia as per nephrology  . Symptomatic anemia transfuse 2 units follow CBC  . Diarrhea during antibiotic exposure check for C. Difficile History of endocarditis continue Ancef with hemodialysis  Other plan as per orders.  DVT prophylaxis:  SCD   Code Status:  FULL CODE  as per patient    Family Communication:   Family not at  Bedside    Disposition Plan:                              Back to current facility when stable                           Consults called: nephrology left msg   Admission status:   obs    Level of care   tele        [place] 03/12/2016, 8:43 PM    [facility]  Pager [phone]   after 2 AM please page floor  coverage PA If 7AM-7PM, please contact the day team taking care of the patient  [email or web address]  Password [email or web address]

## 2016-03-13 DIAGNOSIS — C9000 Multiple myeloma not having achieved remission: Secondary | ICD-10-CM | POA: Diagnosis not present

## 2016-03-13 DIAGNOSIS — I10 Essential (primary) hypertension: Secondary | ICD-10-CM | POA: Diagnosis not present

## 2016-03-13 DIAGNOSIS — I120 Hypertensive chronic kidney disease with stage 5 chronic kidney disease or end stage renal disease: Secondary | ICD-10-CM | POA: Diagnosis not present

## 2016-03-13 DIAGNOSIS — N186 End stage renal disease: Secondary | ICD-10-CM | POA: Diagnosis not present

## 2016-03-13 LAB — COMPREHENSIVE METABOLIC PANEL
ALBUMIN: 1.9 g/dL — AB (ref 3.5–5.0)
ALK PHOS: 72 U/L (ref 38–126)
ALT: <5 U/L — ABNORMAL LOW (ref 14–54)
ANION GAP: 7 (ref 5–15)
AST: 19 U/L (ref 15–41)
BILIRUBIN TOTAL: 0.9 mg/dL (ref 0.3–1.2)
BUN: 32 mg/dL — ABNORMAL HIGH (ref 6–20)
CHLORIDE: 87 mmol/L — AB (ref 101–111)
CO2: 27 mmol/L (ref 22–32)
Calcium: 8 mg/dL — ABNORMAL LOW (ref 8.9–10.3)
Creatinine, Ser: 6.21 mg/dL — ABNORMAL HIGH (ref 0.44–1.00)
GFR, EST AFRICAN AMERICAN: 7 mL/min — AB (ref >60)
GFR, EST NON AFRICAN AMERICAN: 6 mL/min — AB (ref >60)
GLUCOSE: 78 mg/dL (ref 65–99)
POTASSIUM: 4.8 mmol/L (ref 3.5–5.1)
Sodium: 121 mmol/L — ABNORMAL LOW (ref 135–145)
TOTAL PROTEIN: 7.2 g/dL (ref 6.5–8.1)

## 2016-03-13 LAB — CBC
HCT: 25.3 % — ABNORMAL LOW (ref 36.0–46.0)
HEMATOCRIT: 23.7 % — AB (ref 36.0–46.0)
HEMOGLOBIN: 8 g/dL — AB (ref 12.0–15.0)
HEMOGLOBIN: 7.4 g/dL — AB (ref 12.0–15.0)
MCH: 32.5 pg (ref 26.0–34.0)
MCH: 31.9 pg (ref 26.0–34.0)
MCHC: 31.6 g/dL (ref 30.0–36.0)
MCHC: 31.2 g/dL (ref 30.0–36.0)
MCV: 102.8 fL — ABNORMAL HIGH (ref 78.0–100.0)
MCV: 102.2 fL — AB (ref 78.0–100.0)
PLATELETS: 209 10*3/uL (ref 150–400)
Platelets: 224 10*3/uL (ref 150–400)
RBC: 2.46 MIL/uL — ABNORMAL LOW (ref 3.87–5.11)
RBC: 2.32 MIL/uL — ABNORMAL LOW (ref 3.87–5.11)
RDW: 17.4 % — ABNORMAL HIGH (ref 11.5–15.5)
RDW: 17.6 % — ABNORMAL HIGH (ref 11.5–15.5)
WBC: 4 10*3/uL (ref 4.0–10.5)
WBC: 4.7 10*3/uL (ref 4.0–10.5)

## 2016-03-13 LAB — MRSA PCR SCREENING: MRSA by PCR: NEGATIVE

## 2016-03-13 LAB — PREPARE RBC (CROSSMATCH)

## 2016-03-13 LAB — PHOSPHORUS: PHOSPHORUS: 3.7 mg/dL (ref 2.5–4.6)

## 2016-03-13 LAB — MAGNESIUM: MAGNESIUM: 2.2 mg/dL (ref 1.7–2.4)

## 2016-03-13 LAB — C DIFFICILE QUICK SCREEN W PCR REFLEX
C DIFFICILE (CDIFF) TOXIN: NEGATIVE
C Diff antigen: POSITIVE — AB

## 2016-03-13 LAB — TSH: TSH: 1.457 u[IU]/mL (ref 0.350–4.500)

## 2016-03-13 MED ORDER — SODIUM CHLORIDE 0.9% FLUSH
3.0000 mL | Freq: Two times a day (BID) | INTRAVENOUS | Status: DC
  Administered 2016-03-13 – 2016-03-14 (×2): 3 mL via INTRAVENOUS

## 2016-03-13 MED ORDER — LIDOCAINE HCL (PF) 1 % IJ SOLN: 5.0000 mL | INTRAMUSCULAR | Status: DC | PRN

## 2016-03-13 MED ORDER — SODIUM CHLORIDE 0.9% FLUSH: 3.0000 mL | INTRAVENOUS | Status: DC | PRN

## 2016-03-13 MED ORDER — DIPHENHYDRAMINE HCL 50 MG/ML IJ SOLN: 25.0000 mg | Freq: Once | INTRAMUSCULAR | Status: DC

## 2016-03-13 MED ORDER — PENTAFLUOROPROP-TETRAFLUOROETH EX AERO: 1.0000 "application " | INHALATION_SPRAY | CUTANEOUS | Status: DC | PRN

## 2016-03-13 MED ORDER — HYDROCODONE-ACETAMINOPHEN 5-325 MG PO TABS: 1.0000 | ORAL_TABLET | ORAL | Status: DC | PRN

## 2016-03-13 MED ORDER — CEFAZOLIN SODIUM-DEXTROSE 2-4 GM/100ML-% IV SOLN
2.0000 g | INTRAVENOUS | Status: DC
  Filled 2016-03-13: qty 100

## 2016-03-13 MED ORDER — ISOSORBIDE MONONITRATE ER 30 MG PO TB24
60.0000 mg | ORAL_TABLET | Freq: Every day | ORAL | Status: DC
  Administered 2016-03-13 – 2016-03-14 (×2): 60 mg via ORAL
  Filled 2016-03-13 (×2): qty 2

## 2016-03-13 MED ORDER — ONDANSETRON HCL 4 MG PO TABS
4.0000 mg | ORAL_TABLET | Freq: Four times a day (QID) | ORAL | Status: DC | PRN
  Administered 2016-03-14: 4 mg via ORAL
  Filled 2016-03-13: qty 1

## 2016-03-13 MED ORDER — SODIUM CHLORIDE 0.9% FLUSH
3.0000 mL | Freq: Two times a day (BID) | INTRAVENOUS | Status: DC
  Administered 2016-03-13: 3 mL via INTRAVENOUS

## 2016-03-13 MED ORDER — ACETAMINOPHEN 325 MG PO TABS: 650.0000 mg | ORAL_TABLET | Freq: Once | ORAL | Status: DC

## 2016-03-13 MED ORDER — SODIUM CHLORIDE 0.9 % IV SOLN: 100.0000 mL | INTRAVENOUS | Status: DC | PRN

## 2016-03-13 MED ORDER — HYDROCODONE-ACETAMINOPHEN 5-325 MG PO TABS
1.0000 | ORAL_TABLET | ORAL | Status: DC | PRN
  Administered 2016-03-14: 1 via ORAL
  Filled 2016-03-13: qty 1

## 2016-03-13 MED ORDER — SODIUM CHLORIDE 0.9 % IV SOLN: 250.0000 mL | INTRAVENOUS | Status: DC | PRN

## 2016-03-13 MED ORDER — ZOLPIDEM TARTRATE 5 MG PO TABS: 5.0000 mg | ORAL_TABLET | Freq: Every evening | ORAL | Status: DC | PRN

## 2016-03-13 MED ORDER — SODIUM CHLORIDE 0.9 % IV SOLN: Freq: Once | INTRAVENOUS | Status: DC

## 2016-03-13 MED ORDER — DOXERCALCIFEROL 4 MCG/2ML IV SOLN
INTRAVENOUS | Status: AC
  Filled 2016-03-13: qty 2

## 2016-03-13 MED ORDER — CINACALCET HCL 30 MG PO TABS
60.0000 mg | ORAL_TABLET | Freq: Every day | ORAL | Status: DC
  Administered 2016-03-13 – 2016-03-14 (×2): 60 mg via ORAL
  Filled 2016-03-13 (×2): qty 2

## 2016-03-13 MED ORDER — DOXERCALCIFEROL 4 MCG/2ML IV SOLN
3.0000 ug | INTRAVENOUS | Status: DC
  Administered 2016-03-13: 3 ug via INTRAVENOUS
  Filled 2016-03-13: qty 2

## 2016-03-13 MED ORDER — NEPRO/CARBSTEADY PO LIQD
237.0000 mL | Freq: Two times a day (BID) | ORAL | Status: DC
  Administered 2016-03-14: 237 mL via ORAL

## 2016-03-13 MED ORDER — DARBEPOETIN ALFA 150 MCG/0.3ML IJ SOSY
150.0000 ug | PREFILLED_SYRINGE | INTRAMUSCULAR | Status: DC
  Administered 2016-03-13: 150 ug via INTRAVENOUS
  Filled 2016-03-13: qty 0.3

## 2016-03-13 MED ORDER — LIDOCAINE-PRILOCAINE 2.5-2.5 % EX CREA: 1.0000 "application " | TOPICAL_CREAM | CUTANEOUS | Status: DC | PRN

## 2016-03-13 MED ORDER — LANTHANUM CARBONATE 500 MG PO CHEW
1000.0000 mg | CHEWABLE_TABLET | Freq: Two times a day (BID) | ORAL | Status: DC
  Administered 2016-03-13 – 2016-03-14 (×2): 1000 mg via ORAL
  Filled 2016-03-13 (×2): qty 2

## 2016-03-13 MED ORDER — LANTHANUM CARBONATE 500 MG PO CHEW: 2000.0000 mg | CHEWABLE_TABLET | Freq: Three times a day (TID) | ORAL | Status: DC

## 2016-03-13 MED ORDER — HYDROCODONE-ACETAMINOPHEN 5-325 MG PO TABS: 1.0000 | ORAL_TABLET | Freq: Four times a day (QID) | ORAL | Status: DC | PRN

## 2016-03-13 MED ORDER — FUROSEMIDE 10 MG/ML IJ SOLN: 20.0000 mg | Freq: Once | INTRAMUSCULAR | Status: DC

## 2016-03-13 MED ORDER — VALACYCLOVIR HCL 500 MG PO TABS
500.0000 mg | ORAL_TABLET | ORAL | Status: DC
  Administered 2016-03-13: 500 mg via ORAL
  Filled 2016-03-13: qty 1

## 2016-03-13 MED ORDER — CALCIUM ACETATE (PHOS BINDER) 667 MG PO CAPS
667.0000 mg | ORAL_CAPSULE | Freq: Three times a day (TID) | ORAL | Status: DC
  Administered 2016-03-13 – 2016-03-14 (×3): 667 mg via ORAL
  Filled 2016-03-13 (×3): qty 1

## 2016-03-13 MED ORDER — DARBEPOETIN ALFA 150 MCG/0.3ML IJ SOSY
PREFILLED_SYRINGE | INTRAMUSCULAR | Status: AC
  Filled 2016-03-13: qty 0.3

## 2016-03-13 MED ORDER — FAMOTIDINE 20 MG PO TABS
10.0000 mg | ORAL_TABLET | Freq: Every day | ORAL | Status: DC
  Administered 2016-03-13 – 2016-03-14 (×2): 10 mg via ORAL
  Filled 2016-03-13 (×2): qty 1

## 2016-03-13 MED ORDER — ONDANSETRON HCL 4 MG/2ML IJ SOLN: 4.0000 mg | Freq: Four times a day (QID) | INTRAMUSCULAR | Status: DC | PRN

## 2016-03-13 MED ORDER — DIPHENHYDRAMINE HCL 25 MG PO CAPS: 25.0000 mg | ORAL_CAPSULE | Freq: Once | ORAL | Status: DC

## 2016-03-13 MED ORDER — ACETAMINOPHEN 650 MG RE SUPP: 650.0000 mg | Freq: Four times a day (QID) | RECTAL | Status: DC | PRN

## 2016-03-13 MED ORDER — ACETAMINOPHEN 325 MG PO TABS
650.0000 mg | ORAL_TABLET | Freq: Four times a day (QID) | ORAL | Status: DC | PRN
  Administered 2016-03-14: 650 mg via ORAL
  Filled 2016-03-13: qty 2

## 2016-03-13 NOTE — Progress Notes (Signed)
Admission note:  Arrival Method: In Bed from the ED Mental Orientation:Alert oriented tmes 4 Telemetry: yes Assessment:  Skin: Intact, excoriation IV: right arm Pain: none Tubes: none Safety Measures: Bed alarm Fall Prevention Safety Plan:  Admission

## 2016-03-13 NOTE — ED Notes (Signed)
Dr Lovena Neighbours in w/pt.

## 2016-03-13 NOTE — ED Notes (Signed)
Pt eating breakfast - cream and sugar given for pt's coffee.

## 2016-03-13 NOTE — Progress Notes (Addendum)
[name] is 75 Y/O patient with ESRD on HD TTS @ [facility], edw 56kg, no heparin HD. Complex medical history of refractory multiple myeloma, group B strep bacteremia, septic L knee and vegetation on mitral valve confirmed 02/29/16 per TEE. Came to hospital for decreased HGB of 6.1.  She received 2 units PRBCs yesterday.  Currently HGB 7.4. Last OP ESA dose 03/06/16. Patient has been admitted as observation status. Will write hemodialysis orders, give Aranesp 150 mcg IV in HD today. Please notify us if patient status changes to in-patient and we will consult formally.   [name] [facility] [place] [phone]   Pt seen, examined and agree w A/P as above.  Kelly Splinter MD [employer] pager [phone]    cell [phone] 03/13/2016, 11:35 AM

## 2016-03-13 NOTE — ED Notes (Signed)
Per shift report, Tylenol and Benadryl on MAR to give at 0700 not to be given d/t order states to administer prior to transfusion. Also per shift report, EKG already performed.

## 2016-03-13 NOTE — Procedures (Signed)
  I was present at this dialysis session, have reviewed the session itself and made  appropriate changes Kelly Splinter MD [place] pager [phone]    cell [phone] 03/13/2016, 2:31 PM

## 2016-03-13 NOTE — Progress Notes (Signed)
Patient is from [facility]. Patient to be followed by inpatient CSW re: transfer back to SNF when medically stable and cleared for discharge.         Emiliano Dyer, LCSW [facility] ED/53M Clinical Social Worker [phone]

## 2016-03-13 NOTE — Progress Notes (Signed)
PROGRESS NOTE        PATIENT DETAILS Name: [name] Age: 75 y.o. Sex: female Date of Birth: 1941-01-29 Admit Date: 03/12/2016 Admitting Physician Toy Baker, MD CNO:[id number], MD  Brief Narrative: Patient is a 75 y.o. female 75 year old female with ESRD on TTS HD, multiple myeloma, anemia of chronic disease, recent hospitalization(5/19-6/1)4 left knee septic arthritis and Group B streptococcal bacteremia with endocarditis involving the mitral valve on IV Ancef(last day June 24)sent to the emergency room from a skilled nursing facility for a hemoglobin of 6.1 and Sodium of 123. Admitted for blood transfusion and further evaluation and treatment.  Subjective: Lying comfortably in bed-denies any chest pain or shortness of breath.  Assessment/Plan: Anemia:Multifactorial etiology-likely secondary to underlying multiple myeloma, ESRD, recent acute illness. No evidence of melena or hematochezia or any form of overt blood loss.transfused 2 units of PRBC last night, hemoglobin still 7.4, will transfuse 1 additional unit with hemodialysis today.Check FOBT, and ensure stability of hemoglobin prior to discharge.Spoke with Dr Rae Roam further input or recommendation-patient has advanced Multiple Myeloma-and hospice/palliative care has been recommended in the past-however patient and family have been resistant and have sought 2nd opinion at [facility].  Hyponatremia:Likely secondary to volume overload in the setting of ESRD-will defer to nephrology--regarding volume removal during hemodialysis.  Left knee septic arthritis with group B streptococcal bacteremia and native mitral valve endocarditis: Continue IV Ancef-has had a recent workup, including TEE and ID evaluation during her most recent hospitalization. Last day for antibiotic 6/24  Relapsed and refractory multiple myeloma: Followed at [facility] been resistant to palliative care discussion in the  past, will transfuse PRBC-continue outpatient follow-up with primary oncologist at [facility] She no longer follows up at the cancer center here in the [facility]  ESRD: On hemodialysis-nephrology consulted.  Hypertension: Continue to improve, follow BP trend.  DVT Prophylaxis: SCD's  Code Status: Full code   Family Communication: None at bedside  Disposition Plan: Remain inpatient-Back to SNF 6/14  Antimicrobial agents: IV Ancef- stop date 6/24  Procedures: None  CONSULTS:  nephrology  Time spent: 25 minutes-Greater than 50% of this time was spent in counseling, explanation of diagnosis, planning of further management, and coordination of care.  MEDICATIONS: Anti-infectives    Start     Dose/Rate Route Frequency Ordered Stop   03/13/16 1800  ceFAZolin (ANCEF) IVPB 2g/100 mL premix     2 g 200 mL/hr over 30 Minutes Intravenous Every T-Th-Sa (1800) 03/13/16 1002     03/13/16 1200  valACYclovir (VALTREX) tablet 500 mg     500 mg Oral Every 48 hours 03/13/16 0332        Scheduled Meds: . sodium chloride   Intravenous Once  . acetaminophen  650 mg Oral Once  . calcium acetate  667 mg Oral TID WC  .  ceFAZolin (ANCEF) IV  2 g Intravenous Q T,Th,Sat-1800  . cinacalcet  60 mg Oral Q breakfast  . darbepoetin (ARANESP) injection - DIALYSIS  150 mcg Intravenous Q Tue-HD  . diphenhydrAMINE  25 mg Oral Once  . doxercalciferol  3 mcg Intravenous Q T,Th,Sa-HD  . famotidine  10 mg Oral Daily  . feeding supplement (NEPRO CARB STEADY)  237 mL Oral BID BM  . isosorbide mononitrate  60 mg Oral Daily  . lanthanum  1,000 mg Oral BID WC  . sodium chloride flush  3 mL Intravenous Q12H  . sodium chloride flush  3 mL Intravenous Q12H  . valACYclovir  500 mg Oral Q48H   Continuous Infusions:  PRN Meds:.sodium chloride, sodium chloride, sodium chloride, acetaminophen **OR** acetaminophen, HYDROcodone-acetaminophen, lidocaine (PF), lidocaine-prilocaine, ondansetron **OR**  ondansetron (ZOFRAN) IV, pentafluoroprop-tetrafluoroeth, sodium chloride flush, zolpidem   PHYSICAL EXAM: Vital signs: Filed Vitals:   03/13/16 0602 03/13/16 0700 03/13/16 0841 03/13/16 1358  BP: 130/69 161/92 144/78 114/74  Pulse: 54 61 62 59  Temp:   98.8 F (37.1 C) 97.6 F (36.4 C)  TempSrc:   Oral Oral  Resp: '12 15 18 11  ' Height:   '5\' 7"'  (1.702 m)   Weight:    59.8 kg (131 lb 13.4 oz)  SpO2: 99% 99% 100% 100%   Filed Weights   03/13/16 1358  Weight: 59.8 kg (131 lb 13.4 oz)   Body mass index is 20.64 kg/(m^2).   Gen Exam: Awake, alert-clear speech. She appears chronically ill-looking and very frail..  Chest: B/L Clear.   CVS: S1 S2 Regular, no murmurs.  Abdomen: soft, BS +, non tender, non distended.  Extremities:  Left knee-2 sutures in place-but without any major swelling or erythema. Nontender. Neurologic: Non Focal.   Skin: No Rash or lesions   Wounds: N/A.    LABORATORY DATA: CBC:  Recent Labs Lab 03/12/16 1746 03/13/16 0212 03/13/16 0403  WBC 4.3 4.0 4.7  NEUTROABS 3.0  --   --   HGB 6.1* 8.0* 7.4*  HCT 19.5* 25.3* 23.7*  MCV 108.9* 102.8* 102.2*  PLT 236 209 591    Basic Metabolic Panel:  Recent Labs Lab 03/12/16 1746 03/13/16 0403  NA 123* 121*  K 4.7 4.8  CL 86* 87*  CO2 29 27  GLUCOSE 83 78  BUN 28* 32*  CREATININE 5.75* 6.21*  CALCIUM 7.8* 8.0*  MG  --  2.2  PHOS  --  3.7    GFR: Estimated Creatinine Clearance: 7.3 mL/min (by C-G formula based on Cr of 6.21).  Liver Function Tests:  Recent Labs Lab 03/12/16 1746 03/13/16 0403  AST 18 19  ALT <5* <5*  ALKPHOS 68 72  BILITOT 0.5 0.9  PROT 6.8 7.2  ALBUMIN 1.7* 1.9*   No results for input(s): LIPASE, AMYLASE in the last 168 hours. No results for input(s): AMMONIA in the last 168 hours.  Coagulation Profile: No results for input(s): INR, PROTIME in the last 168 hours.  Cardiac Enzymes: No results for input(s): CKTOTAL, CKMB, CKMBINDEX, TROPONINI in the last 168  hours.  BNP (last 3 results) No results for input(s): PROBNP in the last 8760 hours.  HbA1C: No results for input(s): HGBA1C in the last 72 hours.  CBG: No results for input(s): GLUCAP in the last 168 hours.  Lipid Profile: No results for input(s): CHOL, HDL, LDLCALC, TRIG, CHOLHDL, LDLDIRECT in the last 72 hours.  Thyroid Function Tests:  Recent Labs  03/13/16 0403  TSH 1.457    Anemia Panel: No results for input(s): VITAMINB12, FOLATE, FERRITIN, TIBC, IRON, RETICCTPCT in the last 72 hours.  Urine analysis:    Component Value Date/Time   COLORURINE YELLOW 02/18/2016 0130   APPEARANCEUR CLEAR 02/18/2016 0130   LABSPEC 1.010 02/18/2016 0130   PHURINE 8.0 02/18/2016 0130   GLUCOSEU 100* 02/18/2016 0130   HGBUR MODERATE* 02/18/2016 0130   BILIRUBINUR NEGATIVE 02/18/2016 0130   KETONESUR NEGATIVE 02/18/2016 0130   PROTEINUR 100* 02/18/2016 0130   UROBILINOGEN 0.2 04/10/2015 1800   NITRITE NEGATIVE 02/18/2016 0130  LEUKOCYTESUR SMALL* 02/18/2016 0130    Sepsis Labs: Lactic Acid, Venous    Component Value Date/Time   LATICACIDVEN 1.9 02/26/2016 1010    MICROBIOLOGY: Recent Results (from the past 240 hour(s))  C difficile quick scan w PCR reflex     Status: Abnormal   Collection Time: 03/13/16  9:14 AM  Result Value Ref Range Status   C Diff antigen POSITIVE (A) NEGATIVE Final   C Diff toxin NEGATIVE NEGATIVE Final   C Diff interpretation   Final    C. difficile present, but toxin not detected. This indicates colonization. In most cases, this does not require treatment. If patient has signs and symptoms consistent with colitis, consider treatment. Requires ENTERIC precautions.  MRSA PCR Screening     Status: None   Collection Time: 03/13/16  9:14 AM  Result Value Ref Range Status   MRSA by PCR NEGATIVE NEGATIVE Final    Comment:        The GeneXpert MRSA Assay (FDA approved for NASAL specimens only), is one component of a comprehensive MRSA  colonization surveillance program. It is not intended to diagnose MRSA infection nor to guide or monitor treatment for MRSA infections.     RADIOLOGY STUDIES/RESULTS: Dg Chest 2 View  02/18/2016  CLINICAL DATA:  Fever. History of end-stage renal disease on dialysis, multiple myeloma. EXAM: CHEST  2 VIEW COMPARISON:  Chest radiograph September 03, 2015 FINDINGS: Cardiac silhouette is moderately enlarged. Tortuous calcified aorta. No pleural effusion or focal consolidation. Mildly elevated LEFT hemidiaphragm with strandy densities. No pneumothorax. Vascular clips project in LEFT humerus soft tissues. Patient's LEFT arm at side, limiting assessment of thoracic spine. IMPRESSION: Stable cardiomegaly.  LEFT lung base atelectasis. Electronically Signed   By: Elon Alas M.D.   On: 02/18/2016 02:31   Ct Head Wo Contrast  02/17/2016  CLINICAL DATA:  Left arm pain, hypertensive, weakness, weight loss. EXAM: CT HEAD WITHOUT CONTRAST TECHNIQUE: Contiguous axial images were obtained from the base of the skull through the vertex without intravenous contrast. COMPARISON:  Head CT dated 04/10/2015. FINDINGS: Brain: There is mild generalized age-related brain atrophy with commensurate dilatation of the ventricles and sulci. Mild chronic small vessel ischemic changes noted within the deep periventricular white matter. Small old lacunar infarcts noted within the basal ganglia. There is no mass, hemorrhage, edema or other evidence of acute parenchymal abnormality. No extra-axial hemorrhage. Vascular: No hyperdense vessel or unexpected calcification. There are chronic calcified atherosclerotic changes of the large vessels at the skull base. Skull: Negative for fracture or focal lesion. Sinuses/Orbits: Visualized upper paranasal sinuses are clear. Mastoid air cells are clear. Other: None. IMPRESSION: No acute findings.  No intracranial mass, hemorrhage or edema. Electronically Signed   By: Franki Cabot M.D.   On:  02/17/2016 21:58   Dg Shoulder Left  02/18/2016  CLINICAL DATA:  Pt states she has been having left arm pain and weakness since her dialysis needle was removed on Thursday. Hx multiple myeloma. EXAM: LEFT SHOULDER - 2+ VIEW COMPARISON:  None. FINDINGS: No fracture.  No bone lesion. Glenohumeral joint and AC joints are normally aligned. No significant arthropathic change. Bones are demineralized. Surgical vascular clips project along the medial left arm. IMPRESSION: No fracture or dislocation.  No acute finding.  No bone lesion. Electronically Signed   By: Lajean Manes M.D.   On: 02/18/2016 10:31   Dg Knee Complete 4 Views Left  02/17/2016  CLINICAL DATA:  Left knee pain and swelling.  No reported injury.  EXAM: LEFT KNEE - COMPLETE 4+ VIEW COMPARISON:  09/03/2015 left knee radiographs FINDINGS: Moderate suprapatellar left knee joint effusion. A 1 cm round calcification medial and anterior to the left distal femoral shaft may represent a loose intra-articular body (this structure was seen lateral to the left distal femur on the 09/03/2015 radiographs). No fracture, dislocation or suspicious focal osseous lesion. Diffuse osteopenia. Mild tricompartmental left knee osteoarthritis. No appreciable joint erosions. Vascular calcifications throughout the soft tissues posteriorly. IMPRESSION: 1. New moderate suprapatellar left knee joint effusion. 2. Probable 1 cm loose intra-articular osseous body in the suprapatellar left knee joint. 3. Mild tricompartmental osteoarthritis in the left knee. No left knee joint erosions. 4. Diffuse osteopenia. Electronically Signed   By: Ilona Sorrel M.D.   On: 02/17/2016 21:41   Dg Fluoro Guided Needle Plc Aspiration/injection Loc  02/26/2016  CLINICAL DATA:  Left knee pain, swelling and increased warmth. Moderate-sized left knee joint effusion on recent radiographs. Sepsis. Clinical concern for infected knee. EXAM: FLUORO GUIDED NEEDLE PLACEMENT CONTRAST:  None used. FLUOROSCOPY  TIME:  Radiation Exposure Index (as provided by the fluoroscopic device): Not applicable If the device does not provide the exposure index: Fluoroscopy Time (in minutes and seconds):  0 minutes 1 second Number of Acquired Images:  1 screen save COMPARISON:  Left knee radiographs dated 02/17/2016. FINDINGS: Using sterile technique, 1% lidocaine and fluoroscopic guidance, an 18 gauge spinal needle was inserted into the patellofemoral joint space using a lateral approach. 30 cc of clear, reddish fluid was withdrawn and sent to the laboratory for testing. The patient tolerated the procedure well with no immediate complications. IMPRESSION: Successful fluoroscopic guided left knee needle aspiration. Electronically Signed   By: Claudie Revering M.D.   On: 02/26/2016 14:02       Oren Binet, MD  [facility] Pager:[phone]  If 7PM-7AM, please contact night-coverage [email or web address] Password [facility] 03/13/2016, 2:28 PM

## 2016-03-13 NOTE — ED Notes (Signed)
Pt being transported to floor via bed w/telemtry.

## 2016-03-13 NOTE — Progress Notes (Signed)
Pharmacy Antibiotic Note  [name] is a 75 y.o. female admitted on 03/12/2016 with Group B streptococcal bacteremia c/b L knee septic arthritis and native mitral valve endocarditis.  Pharmacy has been consulted for cefazolin dosing. ID recommended cefazolin x 4 weeks last admit, using 5/28 as D#1 of 28, end date 6/24.  Plan: Continue cefazolin 2g IV qHD-TTS through 6/24 Monitor clinical progress, c/s   Height: 5\' 7"  (170.2 cm) IBW/kg (Calculated) : 61.6  Temp (24hrs), Avg:98.5 F (36.9 C), Min:98.3 F (36.8 C), Max:98.8 F (37.1 C)   Recent Labs Lab 03/12/16 1746 03/13/16 0212 03/13/16 0403  WBC 4.3 4.0 4.7  CREATININE 5.75*  --  6.21*    Estimated Creatinine Clearance: 6.8 mL/min (by C-G formula based on Cr of 6.21).    No Known Allergies  Antimicrobials this admission: 5/28 cefazolin >> (6/24)  Dose adjustments this admission:   Microbiology results: 6/13 cdiff:  6/13 MRSA PCR:   Elicia Lamp, PharmD, BCPS Clinical Pharmacist Pager [phone] 03/13/2016 10:05 AM

## 2016-03-14 DIAGNOSIS — N186 End stage renal disease: Secondary | ICD-10-CM | POA: Diagnosis not present

## 2016-03-14 DIAGNOSIS — I120 Hypertensive chronic kidney disease with stage 5 chronic kidney disease or end stage renal disease: Secondary | ICD-10-CM | POA: Diagnosis not present

## 2016-03-14 LAB — CBC
HEMATOCRIT: 26.8 % — AB (ref 36.0–46.0)
Hemoglobin: 8.4 g/dL — ABNORMAL LOW (ref 12.0–15.0)
MCH: 31.6 pg (ref 26.0–34.0)
MCHC: 31.3 g/dL (ref 30.0–36.0)
MCV: 100.8 fL — AB (ref 78.0–100.0)
PLATELETS: 199 10*3/uL (ref 150–400)
RBC: 2.66 MIL/uL — ABNORMAL LOW (ref 3.87–5.11)
RDW: 18.8 % — AB (ref 11.5–15.5)
WBC: 3.8 10*3/uL — ABNORMAL LOW (ref 4.0–10.5)

## 2016-03-14 LAB — TYPE AND SCREEN
ABO/RH(D): O POS
Antibody Screen: NEGATIVE
Unit division: 0
Unit division: 0
Unit division: 0

## 2016-03-14 LAB — BASIC METABOLIC PANEL
Anion gap: 7 (ref 5–15)
BUN: 13 mg/dL (ref 6–20)
CALCIUM: 7.8 mg/dL — AB (ref 8.9–10.3)
CO2: 29 mmol/L (ref 22–32)
Chloride: 91 mmol/L — ABNORMAL LOW (ref 101–111)
Creatinine, Ser: 3.48 mg/dL — ABNORMAL HIGH (ref 0.44–1.00)
GFR calc Af Amer: 14 mL/min — ABNORMAL LOW (ref >60)
GFR, EST NON AFRICAN AMERICAN: 12 mL/min — AB (ref >60)
GLUCOSE: 75 mg/dL (ref 65–99)
POTASSIUM: 3.7 mmol/L (ref 3.5–5.1)
Sodium: 127 mmol/L — ABNORMAL LOW (ref 135–145)

## 2016-03-14 LAB — GLUCOSE, CAPILLARY: Glucose-Capillary: 84 mg/dL (ref 65–99)

## 2016-03-14 LAB — HEMOGLOBIN A1C
Hgb A1c MFr Bld: 4.9 % (ref 4.8–5.6)
Mean Plasma Glucose: 94 mg/dL

## 2016-03-14 LAB — CLOSTRIDIUM DIFFICILE BY PCR: Toxigenic C. Difficile by PCR: NEGATIVE

## 2016-03-14 MED ORDER — CEFAZOLIN SODIUM-DEXTROSE 2-4 GM/100ML-% IV SOLN
2.0000 g | Freq: Once | INTRAVENOUS | Status: AC
  Administered 2016-03-14: 2 g via INTRAVENOUS
  Filled 2016-03-14: qty 100

## 2016-03-14 NOTE — NC FL2 (Signed)
[place] MEDICAID FL2 LEVEL OF CARE SCREENING TOOL     IDENTIFICATION  Patient Name: [name] Birthdate: [birth year] Sex: female Admission Date (Current Location): 03/12/2016  [facility] and [place] Number:      Facility and Address:   [facility] [address], [place] Provider Number:    Attending Physician Name and Address:  Mir Marry Guan,*  Relative Name and Phone Number:   Jozelyn Kuwahara - Daughter  [phone] (mobile)  Current Level of Care:  Hospital Recommended Level of Care:  Skilled nursing facility Prior Approval Number:    Date Approved/Denied:   PASRR Number: [id number] (Eff. Sep 10, 1941)  Discharge Plan: SNF    Current Diagnoses: Patient Active Problem List   Diagnosis Date Noted  . Hyponatremia 03/12/2016  . Symptomatic anemia 03/12/2016  . Diarrhea 03/12/2016  . Bacterial endocarditis   . Staphylococcal arthritis of left knee ([place])   . Left knee pain   . Bacteremia due to group B Streptococcus 02/28/2016  . Essential hypertension   . Fever   . Weakness   . Hypertensive urgency 02/18/2016  . Swelling of joint of left knee 02/18/2016  . Leukopenia due to antineoplastic chemotherapy 09/03/2015  . Hyperkalemia 09/03/2015  . Malnutrition of moderate degree 09/02/2015  . Closed fracture of right distal femur ([place]) 09/01/2015  . GERD (gastroesophageal reflux disease) 04/23/2015  . Fall 04/10/2015  . 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 ESRD (end stage renal disease) ([employer]) 03/11/2014  . Multiple myeloma ([place]) 03/11/2014  . Hypertension associatd with end stage renal disease on dialysis 03/11/2014    Orientation RESPIRATION BLADDER Height & Weight     Self, Time, Situation, Place  Normal Incontinent Weight: 124 lb 5.4 oz (56.4 kg) (bedscale) Height:  '5\' 7"'  (170.2 cm)  BEHAVIORAL SYMPTOMS/MOOD NEUROLOGICAL BOWEL NUTRITION STATUS      Incontinent Diet (Heart Healthy)  AMBULATORY STATUS COMMUNICATION OF NEEDS  Skin   Limited Assist Verbally Other (Comment) (Moisture associated skin damage to breast & bilateral buttocks, treated with barrier cream)                       Personal Care Assistance Level of Assistance  Bathing, Feeding, Dressing Bathing Assistance: Limited assistance Feeding assistance: Independent Dressing Assistance: Limited assistance     Functional Limitations Info  Sight, Hearing, Speech Sight Info: Adequate Hearing Info: Adequate Speech Info: Adequate    SPECIAL CARE FACTORS FREQUENCY        PT Frequency: Not evaluated OT Frequency: Not evaluated            Contractures Contractures Info: Not present    Additional Factors Info  Code Status, Allergies Code Status Info: Full Allergies Info: No known allergies           Current Medications (03/14/2016):  This is the current hospital active medication list Current Facility-Administered Medications  Medication Dose Route Frequency Provider Last Rate Last Dose  . 0.9 %  sodium chloride infusion   Intravenous Once Anastassia Doutova, MD      . 0.9 %  sodium chloride infusion  250 mL Intravenous PRN Toy Baker, MD      . 0.9 %  sodium chloride infusion   Intravenous Once Jonetta Osgood, MD      . acetaminophen (TYLENOL) tablet 650 mg  650 mg Oral Q6H PRN Toy Baker, MD   650 mg at 03/14/16 0703   Or  . acetaminophen (TYLENOL) suppository 650 mg  650 mg Rectal Q6H PRN Toy Baker, MD      . acetaminophen (TYLENOL) tablet 650 mg  650 mg Oral Once Toy Baker, MD   650 mg at 03/13/16 0700  . acetaminophen (TYLENOL) tablet 650 mg  650 mg Oral Once Jonetta Osgood, MD   650 mg at 03/13/16 1846  . calcium acetate (PHOSLO) capsule 667 mg  667 mg Oral TID WC Toy Baker, MD   667 mg at 03/14/16 1257  . ceFAZolin (ANCEF) IVPB 2g/100 mL premix  2 g Intravenous Q T,Th,Sat-1800 Romona Curls, [facility]      . cinacalcet (SENSIPAR) tablet 60 mg  60 mg Oral Q breakfast Toy Baker, MD   60 mg at 03/14/16 0830  . Darbepoetin Alfa (ARANESP) injection 150 mcg  150 mcg Intravenous Q Tue-HD Valentina Gu, NP   150 mcg at 03/13/16 1840  . diphenhydrAMINE (BENADRYL) capsule 25 mg  25 mg Oral Once Toy Baker, MD   25 mg at 03/13/16 0700  . diphenhydrAMINE (BENADRYL) injection 25 mg  25 mg Intravenous Once Jonetta Osgood, MD   25 mg at 03/13/16 1846  . doxercalciferol (HECTOROL) injection 3 mcg  3 mcg Intravenous Q T,Th,Sa-HD Valentina Gu, NP   3 mcg at 03/13/16 1841  . famotidine (PEPCID) tablet 10 mg  10 mg Oral Daily Toy Baker, MD   10 mg at 03/14/16 1108  . feeding supplement (NEPRO CARB STEADY) liquid 237 mL  237 mL Oral BID BM Toy Baker, MD   237 mL at 03/14/16 1112  . HYDROcodone-acetaminophen (NORCO/VICODIN) 5-325 MG per tablet 1 tablet  1 tablet Oral Q4H PRN Jonetta Osgood, MD   1 tablet at 03/14/16 0829  . isosorbide mononitrate (IMDUR) 24 hr tablet 60 mg  60 mg Oral Daily Toy Baker, MD   60 mg at 03/14/16 1109  . lanthanum (FOSRENOL) chewable tablet 1,000 mg  1,000 mg Oral BID WC Toy Baker, MD   1,000 mg at 03/14/16 0830  . ondansetron (ZOFRAN) tablet 4 mg  4 mg Oral Q6H PRN Toy Baker, MD   4 mg at 03/14/16 0105   Or  . ondansetron (ZOFRAN) injection 4 mg  4 mg Intravenous Q6H PRN Toy Baker, MD      . sodium chloride flush (NS) 0.9 % injection 3 mL  3 mL Intravenous Q12H Toy Baker, MD   3 mL at 03/14/16 1000  . sodium chloride flush (NS) 0.9 % injection 3 mL  3 mL Intravenous Q12H Toy Baker, MD   3 mL at 03/13/16 2200  . sodium chloride flush (NS) 0.9 % injection 3 mL  3 mL Intravenous PRN Toy Baker, MD      . valACYclovir (VALTREX) tablet 500 mg  500 mg Oral Q48H Toy Baker, MD   500 mg at 03/13/16 2201  . zolpidem (AMBIEN) tablet 5 mg  5 mg Oral QHS PRN Toy Baker, MD         Discharge Medications: Please see discharge summary for a list  of discharge medications.  Relevant Imaging Results:  Relevant Lab Results:   Additional Information ss#[id number]  Sable Feil, LCSW

## 2016-03-14 NOTE — Care Management Note (Signed)
Case Management Note  Patient Details  Name: Lavaun Greenfield MRN: [id number] Date of Birth: [birth year]  Subjective/Objective:               CM following for progression and d/c planning.     Action/Plan: 03/14/2016 Met with pt and plan is to return to SNF, pt aware of plan to d/c today and return to SNF.  No HH or DME needs at this time.  Expected Discharge Date:     03/14/2016             Expected Discharge Plan:  Skilled Nursing Facility  In-House Referral:  Clinical Social Work  Discharge planning Services  NA  Post Acute Care Choice:  NA Choice offered to:  NA  DME Arranged:  N/A DME Agency:  NA  HH Arranged:  NA HH Agency:  NA  Status of Service:  Completed, signed off  Medicare Important Message Given:    Date Medicare IM Given:    Medicare IM give by:    Date Additional Medicare IM Given:    Additional Medicare Important Message give by:     If discussed at [place] of Stay Meetings, dates discussed:    Additional Comments:  Adron Bene, RN 03/14/2016, 3:59 PM

## 2016-03-14 NOTE — Care Management Obs Status (Addendum)
[place] NOTIFICATION   Patient Details  Name: [name] MRN: [id number] Date of Birth: 01-12-1941   Medicare Observation Status Notification Given:  Yes OBS status explained, this CM unsure that pt understands this concept, currently awaiting ambulance to return to SNF.    Jaelyn Bourgoin, Rory Percy, RN 03/14/2016, 3:41 PM

## 2016-03-14 NOTE — Progress Notes (Signed)
Pharmacy Antibiotic Note  [name] is a 75 y.o. female admitted on 03/12/2016 with Group B streptococcal bacteremia c/b L knee septic arthritis and native mitral valve endocarditis.  Pharmacy has been consulted for cefazolin dosing. ID recommended cefazolin x 4 weeks last admit, using 5/28 as D#1 of 28, end date 6/24.  Dose ordered for last night, appears was not given. Will order 1x dose for today, then back on schedule.  Plan: Cefazolin 2g IV x1 dose this AM Continue cefazolin 2g IV qHD-TTS through 6/24 Monitor clinical progress, c/s   Height: 5\' 7"  (170.2 cm) Weight: 124 lb 5.4 oz (56.4 kg) (bedscale) IBW/kg (Calculated) : 61.6  Temp (24hrs), Avg:98.6 F (37 C), Min:97.6 F (36.4 C), Max:100.7 F (38.2 C)   Recent Labs Lab 03/12/16 1746 03/13/16 0212 03/13/16 0403 03/14/16 0501  WBC 4.3 4.0 4.7 3.8*  CREATININE 5.75*  --  6.21* 3.48*    Estimated Creatinine Clearance: 12.2 mL/min (by C-G formula based on Cr of 3.48).    No Known Allergies  Antimicrobials this admission: 5/28 cefazolin >> (6/24)  Dose adjustments this admission:   Microbiology results: 6/13 cdiff:  6/13 MRSA PCR:   Elicia Lamp, PharmD, BCPS Clinical Pharmacist Pager [phone] 03/14/2016 10:29 AM

## 2016-03-14 NOTE — Discharge Summary (Signed)
Discharge Summary  [name] VOJ:[id number] DOB: 1941/05/21  PCP: Benito Mccreedy, MD  Admit date: 03/12/2016 Discharge date: 03/14/2016   Recommendations for Outpatient Follow-up:  1. PCP 1-2 weeks 2. [facility]ology for Mult Myeloma as needed 3. Renal as needed, as well as scheduled HD.   Discharge Diagnoses:  Active Hospital Problems   Diagnosis Date Noted  . Hyponatremia 03/12/2016  . Symptomatic anemia 03/12/2016  . Diarrhea 03/12/2016  . Essential hypertension   . 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 ESRD (end stage renal disease) ([place]) 03/11/2014  . Hypertension associatd with end stage renal disease on dialysis 03/11/2014  . Multiple myeloma ([place]) 03/11/2014    Resolved Hospital Problems   Diagnosis Date Noted Date Resolved  No resolved problems to display.    Discharge Condition: Stable   Diet recommendation: Renal and heart healthy.   Filed Vitals:   03/14/16 1000 03/14/16 1100  BP: 126/80   Pulse: 72   Temp: 99.3 F (37.4 C) 98.9 F (37.2 C)  Resp: 18     History of present illness:  Patient is a 75 y.o. female 75 year old female with ESRD on TTS HD, multiple myeloma, anemia of chronic disease, recent hospitalization(5/19-6/1)4 left knee septic arthritis and Group B streptococcal bacteremia with endocarditis involving the mitral valve on IV Ancef(last day June 24)sent to the emergency room from a skilled nursing facility for a hemoglobin of 6.1 and Sodium of 123. Admitted for blood transfusion and further evaluation and treatment.  Subjective: Lying comfortably in bed-denies any chest pain or shortness of breath. She did have a low grade fever this AM, blood cultures have been drawn and it has not recurred.  Assessment/Plan: Anemia:Multifactorial etiology-likely secondary to underlying multiple myeloma, ESRD, recent acute illness. No evidence of melena or hematochezia or any form of overt blood loss.transfused 2 units of PRBC 6/12, and  additional unit was given on HD 6/13. Hb has been quite stable so she is ready for discharge this PM. Dr. Tera Helper spoke with Dr Rae Roam further input or recommendation-patient has advanced Multiple Myeloma-and hospice/palliative care has been recommended in the past-however patient and family have been resistant and have sought 2nd opinion at [facility].  Hyponatremia:Likely secondary to volume overload in the setting of ESRD, she has had volume removal and sodium level is much improved today.  Left knee septic arthritis with group B streptococcal bacteremia and native mitral valve endocarditis: Continue IV Ancef-has had a recent workup, including TEE and ID evaluation during her most recent hospitalization. Last day for antibiotic 6/24. Note low grade fever which resolved quickly with tylenol. Blood culture was obtained, can be follow up as an outpatient. Continue Ancef until 6/24 as planned.  Relapsed and refractory multiple myeloma: Followed at [facility] been resistant to palliative care discussion in the past, will transfuse PRBC-continue outpatient follow-up with primary oncologist at [facility]. She no longer follows up at the cancer center here at [facility].  ESRD: On hemodialysis-nephrology consulted.  Hypertension: Continue to improve, follow BP trend.  Procedures:  None   Consultations:  Nephrology for HD   Discharge Exam: BP 126/80 mmHg  Pulse 72  Temp(Src) 98.9 F (37.2 C) (Oral)  Resp 18  Ht '5\' 7"'  (1.702 m)  Wt 56.4 kg (124 lb 5.4 oz)  BMI 19.47 kg/m2  SpO2 100% General:  Alert, oriented, calm, but tearful at times begs to go home today Eyes: pupils round and reactive to light and accomodation, clear sclerea Neck: supple, no masses, trachea mildline  Cardiovascular: RRR, no murmurs or rubs, no peripheral edema  Respiratory: clear to auscultation bilaterally, no wheezes, no crackles  Abdomen: soft, nontender, nondistended, normal bowel tones heard  Skin:  dry, no rashes  Musculoskeletal: no joint effusions, normal range of motion  Psychiatric: appropriate affect, normal speech  Neurologic: extraocular muscles intact, clear speech, moving all extremities with intact sensorium    Discharge Instructions You were cared for by a hospitalist during your hospital stay. If you have any questions about your discharge medications or the care you received while you were in the hospital after you are discharged, you can call the unit and asked to speak with the hospitalist on call if the hospitalist that took care of you is not available. Once you are discharged, your primary care physician will handle any further medical issues. Please note that NO REFILLS for any discharge medications will be authorized once you are discharged, as it is imperative that you return to your primary care physician (or establish a relationship with a primary care physician if you do not have one) for your aftercare needs so that they can reassess your need for medications and monitor your lab values.  Discharge Instructions    Diet - low sodium heart healthy    Complete by:  As directed      Increase activity slowly    Complete by:  As directed             Medication List    TAKE these medications        bisacodyl 10 MG suppository  Commonly known as:  DULCOLAX  Place 1 suppository (10 mg total) rectally daily as needed for moderate constipation.     calcium acetate 667 MG capsule  Commonly known as:  PHOSLO  Take 1 capsule (667 mg total) by mouth 3 (three) times daily with meals.     ceFAZolin 2-4 GM/100ML-% IVPB  Commonly known as:  ANCEF  Inject 100 mLs (2 g total) into the vein Every Tuesday,Thursday,and Saturday with dialysis. End date: 03/24/16.     cinacalcet 60 MG tablet  Commonly known as:  SENSIPAR  Take 60 mg by mouth daily.     feeding supplement (NEPRO CARB STEADY) Liqd  Take 237 mLs by mouth 2 (two) times daily between meals.     feeding  supplement (PRO-STAT SUGAR FREE 64) Liqd  Take 30 mLs by mouth 2 (two) times daily.     HYDROcodone-acetaminophen 5-325 MG tablet  Commonly known as:  NORCO/VICODIN  Take 1 tablet by mouth every 6 (six) hours as needed for moderate pain or severe pain.     isosorbide mononitrate 60 MG 24 hr tablet  Commonly known as:  IMDUR  Take 60 mg by mouth daily.     lanthanum 1000 MG chewable tablet  Commonly known as:  FOSRENOL  Chew 1,000 mg by mouth 2 (two) times daily with a meal.     mirtazapine 15 MG disintegrating tablet  Commonly known as:  REMERON SOL-TAB  Take 15 mg by mouth. Dissolve 1 tablet by mouth at bedtime for appetite.     multivitamin Tabs tablet  Take 1 tablet by mouth daily.     ondansetron 4 MG disintegrating tablet  Commonly known as:  ZOFRAN ODT  Take 1 tablet (4 mg total) by mouth every 8 (eight) hours as needed for nausea or vomiting.     pantoprazole 40 MG tablet  Commonly known as:  PROTONIX  TAKE 1 TABLET BY MOUTH  DAILY     prochlorperazine 10 MG tablet  Commonly known as:  COMPAZINE  Take 1 tablet (10 mg total) by mouth every 6 (six) hours as needed (Nausea or vomiting).     promethazine 25 MG tablet  Commonly known as:  PHENERGAN  Take 25 mg by mouth every 6 (six) hours as needed for nausea or vomiting.     ranitidine 300 MG tablet  Commonly known as:  ZANTAC  Take 300 mg by mouth at bedtime. Reported on 03/05/2016     simethicone 125 MG chewable tablet  Commonly known as:  MYLICON  Take 1 tablet by mouth prior to each meal.     valACYclovir 500 MG tablet  Commonly known as:  VALTREX  Take 1 tablet (500 mg total) by mouth every other day.     zolpidem 5 MG tablet  Commonly known as:  AMBIEN  Take 1 tablet (5 mg total) by mouth at bedtime as needed for sleep.       No Known Allergies    The results of significant diagnostics from this hospitalization (including imaging, microbiology, ancillary and laboratory) are listed below for reference.     Significant Diagnostic Studies: Dg Chest 2 View  02/18/2016  CLINICAL DATA:  Fever. History of end-stage renal disease on dialysis, multiple myeloma. EXAM: CHEST  2 VIEW COMPARISON:  Chest radiograph September 03, 2015 FINDINGS: Cardiac silhouette is moderately enlarged. Tortuous calcified aorta. No pleural effusion or focal consolidation. Mildly elevated LEFT hemidiaphragm with strandy densities. No pneumothorax. Vascular clips project in LEFT humerus soft tissues. Patient's LEFT arm at side, limiting assessment of thoracic spine. IMPRESSION: Stable cardiomegaly.  LEFT lung base atelectasis. Electronically Signed   By: Elon Alas M.D.   On: 02/18/2016 02:31   Ct Head Wo Contrast  02/17/2016  CLINICAL DATA:  Left arm pain, hypertensive, weakness, weight loss. EXAM: CT HEAD WITHOUT CONTRAST TECHNIQUE: Contiguous axial images were obtained from the base of the skull through the vertex without intravenous contrast. COMPARISON:  Head CT dated 04/10/2015. FINDINGS: Brain: There is mild generalized age-related brain atrophy with commensurate dilatation of the ventricles and sulci. Mild chronic small vessel ischemic changes noted within the deep periventricular white matter. Small old lacunar infarcts noted within the basal ganglia. There is no mass, hemorrhage, edema or other evidence of acute parenchymal abnormality. No extra-axial hemorrhage. Vascular: No hyperdense vessel or unexpected calcification. There are chronic calcified atherosclerotic changes of the large vessels at the skull base. Skull: Negative for fracture or focal lesion. Sinuses/Orbits: Visualized upper paranasal sinuses are clear. Mastoid air cells are clear. Other: None. IMPRESSION: No acute findings.  No intracranial mass, hemorrhage or edema. Electronically Signed   By: Franki Cabot M.D.   On: 02/17/2016 21:58   Dg Shoulder Left  02/18/2016  CLINICAL DATA:  Pt states she has been having left arm pain and weakness since her dialysis  needle was removed on Thursday. Hx multiple myeloma. EXAM: LEFT SHOULDER - 2+ VIEW COMPARISON:  None. FINDINGS: No fracture.  No bone lesion. Glenohumeral joint and AC joints are normally aligned. No significant arthropathic change. Bones are demineralized. Surgical vascular clips project along the medial left arm. IMPRESSION: No fracture or dislocation.  No acute finding.  No bone lesion. Electronically Signed   By: Lajean Manes M.D.   On: 02/18/2016 10:31   Dg Knee Complete 4 Views Left  02/17/2016  CLINICAL DATA:  Left knee pain and swelling.  No reported injury. EXAM: LEFT KNEE -  COMPLETE 4+ VIEW COMPARISON:  09/03/2015 left knee radiographs FINDINGS: Moderate suprapatellar left knee joint effusion. A 1 cm round calcification medial and anterior to the left distal femoral shaft may represent a loose intra-articular body (this structure was seen lateral to the left distal femur on the 09/03/2015 radiographs). No fracture, dislocation or suspicious focal osseous lesion. Diffuse osteopenia. Mild tricompartmental left knee osteoarthritis. No appreciable joint erosions. Vascular calcifications throughout the soft tissues posteriorly. IMPRESSION: 1. New moderate suprapatellar left knee joint effusion. 2. Probable 1 cm loose intra-articular osseous body in the suprapatellar left knee joint. 3. Mild tricompartmental osteoarthritis in the left knee. No left knee joint erosions. 4. Diffuse osteopenia. Electronically Signed   By: Ilona Sorrel M.D.   On: 02/17/2016 21:41   Dg Fluoro Guided Needle Plc Aspiration/injection Loc  02/26/2016  CLINICAL DATA:  Left knee pain, swelling and increased warmth. Moderate-sized left knee joint effusion on recent radiographs. Sepsis. Clinical concern for infected knee. EXAM: FLUORO GUIDED NEEDLE PLACEMENT CONTRAST:  None used. FLUOROSCOPY TIME:  Radiation Exposure Index (as provided by the fluoroscopic device): Not applicable If the device does not provide the exposure index:  Fluoroscopy Time (in minutes and seconds):  0 minutes 1 second Number of Acquired Images:  1 screen save COMPARISON:  Left knee radiographs dated 02/17/2016. FINDINGS: Using sterile technique, 1% lidocaine and fluoroscopic guidance, an 18 gauge spinal needle was inserted into the patellofemoral joint space using a lateral approach. 30 cc of clear, reddish fluid was withdrawn and sent to the laboratory for testing. The patient tolerated the procedure well with no immediate complications. IMPRESSION: Successful fluoroscopic guided left knee needle aspiration. Electronically Signed   By: Claudie Revering M.D.   On: 02/26/2016 14:02    Microbiology: Recent Results (from the past 240 hour(s))  C difficile quick scan w PCR reflex     Status: Abnormal   Collection Time: 03/13/16  9:14 AM  Result Value Ref Range Status   C Diff antigen POSITIVE (A) NEGATIVE Final   C Diff toxin NEGATIVE NEGATIVE Final   C Diff interpretation   Final    C. difficile present, but toxin not detected. This indicates colonization. In most cases, this does not require treatment. If patient has signs and symptoms consistent with colitis, consider treatment. Requires ENTERIC precautions.  MRSA PCR Screening     Status: None   Collection Time: 03/13/16  9:14 AM  Result Value Ref Range Status   MRSA by PCR NEGATIVE NEGATIVE Final    Comment:        The GeneXpert MRSA Assay (FDA approved for NASAL specimens only), is one component of a comprehensive MRSA colonization surveillance program. It is not intended to diagnose MRSA infection nor to guide or monitor treatment for MRSA infections.   Clostridium Difficile by PCR     Status: None   Collection Time: 03/13/16  9:14 AM  Result Value Ref Range Status   Toxigenic C Difficile by pcr NEGATIVE NEGATIVE Final     Labs: Basic Metabolic Panel:  Recent Labs Lab 03/12/16 1746 03/13/16 0403 03/14/16 0501  NA 123* 121* 127*  K 4.7 4.8 3.7  CL 86* 87* 91*  CO2 '29 27 29    ' GLUCOSE 83 78 75  BUN 28* 32* 13  CREATININE 5.75* 6.21* 3.48*  CALCIUM 7.8* 8.0* 7.8*  MG  --  2.2  --   PHOS  --  3.7  --    Liver Function Tests:  Recent Labs Lab 03/12/16 1746  03/13/16 0403  AST 18 19  ALT <5* <5*  ALKPHOS 68 72  BILITOT 0.5 0.9  PROT 6.8 7.2  ALBUMIN 1.7* 1.9*   No results for input(s): LIPASE, AMYLASE in the last 168 hours. No results for input(s): AMMONIA in the last 168 hours. CBC:  Recent Labs Lab 03/12/16 1746 03/13/16 0212 03/13/16 0403 03/14/16 0501  WBC 4.3 4.0 4.7 3.8*  NEUTROABS 3.0  --   --   --   HGB 6.1* 8.0* 7.4* 8.4*  HCT 19.5* 25.3* 23.7* 26.8*  MCV 108.9* 102.8* 102.2* 100.8*  PLT 236 209 224 199   Cardiac Enzymes: No results for input(s): CKTOTAL, CKMB, CKMBINDEX, TROPONINI in the last 168 hours. BNP: BNP (last 3 results) No results for input(s): BNP in the last 8760 hours.  ProBNP (last 3 results) No results for input(s): PROBNP in the last 8760 hours.  CBG:  Recent Labs Lab 03/14/16 0729  GLUCAP 84    Time spent: 36 minutes were spent in preparing this discharge including medication reconciliation, counseling, and coordination of care.  Signed:  Melysa Schroyer Mohammed Hollice Gong  [facility] 03/14/2016, 1:30 PM

## 2016-03-14 NOTE — Progress Notes (Signed)
Patient Discharge: To SNF- [facility]  Disposition: Transport here to get patient  Education: yes  IV: removed  Telemetry: removed  Follow-up appointments:  Prescriptions: Sent to SNF  Transportation: [employer] [facility]  Belongings: In tow with the patient.

## 2016-03-14 NOTE — Progress Notes (Signed)
Report given to RN at [facility].  Awaiting transport.

## 2016-03-14 NOTE — Clinical Social Work Note (Signed)
Patient medically stable for discharge back to [facility] today. Discharge information transmitted to facility and transportation arranged via ambulance ([facility]). Patient's daughter Abi Baas [id number]) contacted regarding discharge and ambulance transport.  Rashawn Rolon Givens, MSW, LCSW Licensed Clinical Social Worker [place] [phone]

## 2016-03-14 NOTE — Clinical Social Work Note (Signed)
Clinical Social Work Assessment  Patient Details  Name: [name] MRN: [id number] Date of Birth: June 22, 1941  Date of referral:  03/12/16               Reason for consult:  Facility Placement                Permission sought to share information with:  Family Supports Permission granted to share information::  Yes, Verbal Permission Granted  Name::     [name]  Agency::     Relationship::  Daughter  Contact Information:  [phone]  Housing/Transportation Living arrangements for the past 2 months:  [facility]) Source of Information:  Patient Patient Interpreter Needed:  None Criminal Activity/Legal Involvement Pertinent to Current Situation/Hospitalization:  No - Comment as needed Significant Relationships:  Adult Children Lives with:  Facility Resident [facility]) Do you feel safe going back to the place where you live?  Yes Need for family participation in patient care:  Yes (Comment)  Care giving concerns:  When asked if returning to facility, patient expressed that she had no where else to go.   Social Worker assessment / plan:  CSW talked with patient regarding discharge plans and she confirmed return to [place]. [name] also gave permission for her daughter [name] to be contacted regarding discharge.  Employment status:  Retired Forensic scientist:  Information systems manager, Medicaid In [place] PT Recommendations:  Not assessed at this time [place] / Referral to community resources:  Other (Comment Required) (None needed or requested at thie time- patient from a facility)  Patient/Family's Response to care:  No concerns expressed by patient regarding care during hospitalization.  Patient/Family's Understanding of and Emotional Response to Diagnosis, Current Treatment, and Prognosis:  Not discussed.  Emotional Assessment Appearance:  Appears stated age Attitude/Demeanor/Rapport:  Other (Appropriate) Affect (typically  observed):  Appropriate Orientation:  Oriented to Self, Oriented to Place, Oriented to  Time, Oriented to Situation Alcohol / Substance use:  Never Used Psych involvement (Current and /or in the community):  No (Comment)  Discharge Needs  Concerns to be addressed:  Discharge Planning Concerns Readmission within the last 30 days:  Yes Current discharge risk:  None Barriers to Discharge:  No Barriers Identified   Sable Feil, LCSW 03/14/2016, 2:46 PM

## 2016-03-15 NOTE — Progress Notes (Signed)
Spoke with [name] at [facility] regarding plans for Monday's blood transfusion. Was told that if patient received blood in ED that she doesn't need to come here Monday for blood. Two blood slips were noted to be scanned in from ED visit 03/12/16 so appointment is cancelled for Monday.

## 2016-03-16 ENCOUNTER — Other Ambulatory Visit: Payer: Self-pay | Admitting: *Deleted

## 2016-03-16 ENCOUNTER — Non-Acute Institutional Stay (SKILLED_NURSING_FACILITY): Payer: Medicare Other | Admitting: Nurse Practitioner

## 2016-03-16 ENCOUNTER — Encounter: Payer: Self-pay | Admitting: Nurse Practitioner

## 2016-03-16 DIAGNOSIS — I10 Essential (primary) hypertension: Secondary | ICD-10-CM

## 2016-03-16 DIAGNOSIS — D649 Anemia, unspecified: Secondary | ICD-10-CM

## 2016-03-16 DIAGNOSIS — M00062 Staphylococcal arthritis, left knee: Secondary | ICD-10-CM | POA: Diagnosis not present

## 2016-03-16 DIAGNOSIS — C9000 Multiple myeloma not having achieved remission: Secondary | ICD-10-CM | POA: Diagnosis not present

## 2016-03-16 DIAGNOSIS — E871 Hypo-osmolality and hyponatremia: Secondary | ICD-10-CM

## 2016-03-16 MED ORDER — HYDROCODONE-ACETAMINOPHEN 5-325 MG PO TABS: 1.0000 | ORAL_TABLET | Freq: Four times a day (QID) | ORAL | Status: DC | PRN

## 2016-03-16 NOTE — Telephone Encounter (Signed)
[facility] 

## 2016-03-16 NOTE — Progress Notes (Signed)
Patient ID: [name], female   DOB: 08/03/1941, 75 y.o.   MRN: [id number]    Nursing Home Location:  [place] of Service: SNF (31)  PCP: Gildardo Cranker, DO  No Known Allergies  Chief Complaint  Patient presents with  . Hospitalization Follow-up    Hospital stay 03/12/16 - 03/14/16    HPI:  Patient is a 75 y.o. female seen today at [facility] for hospital follow up. 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 Most recently hospitalized after hgb drop and needing transfusion and hyponatremic. Pt was transfused 2 units PRBC and thought hyponatremia was secondary to volume overload in the setting of ESRD, she has had volume removal and sodium level improved. Pt conts on IV ancef until 03/24/16. Pt reports knee pain has improved significantly. Denies shortness of breath, chest pains palpitation.    Review of Systems:  Review of Systems  Constitutional: Negative for activity change, appetite change, fatigue and unexpected weight change.  HENT: Negative for congestion and hearing loss.   Eyes: Negative.   Respiratory: Negative for cough and shortness of breath.   Cardiovascular: Negative for chest pain, palpitations and leg swelling.  Gastrointestinal: Negative for abdominal pain, diarrhea and constipation.  Genitourinary: Negative for dysuria.  Musculoskeletal: Negative for myalgias and arthralgias.  Skin: Negative for color change and wound.  Neurological: Negative for dizziness and weakness.  Psychiatric/Behavioral: Negative for behavioral problems, confusion and agitation.    Past Medical History  Diagnosis Date  . ESRD (end stage renal disease) ([place]) 03/11/2014  .  Hypertension associatd with end stage renal disease on dialysis 03/11/2014  . GERD (gastroesophageal reflux disease) 04/23/2015  . Closed fracture of right distal femur ([place]) 09/01/2015  . Multiple myeloma ([place]) 03/11/2014  . Malnutrition of moderate degree 09/02/2015   Past Surgical History  Procedure Laterality Date  . Abdominal hysterectomy      Pt. denies  . I&d extremity Left 02/27/2016    Procedure: ARTHROSCOPIC IRRIGATION AND DEBRIDEMENT EXTREMITY;  Surgeon: Renette Butters, MD;  Location: [place];  Service: Orthopedics;  Laterality: Left;  . Tee without cardioversion N/A 02/29/2016    Procedure: TRANSESOPHAGEAL ECHOCARDIOGRAM (TEE);  Surgeon: Thayer Headings, MD;  Location: [place];  Service: Cardiovascular;  Laterality: N/A;   Social History:   reports that she has never smoked. She has never used smokeless tobacco. She reports that she does not drink alcohol or use illicit drugs.  Family History  Problem Relation Age of Onset  . Hypertension Mother   . Hypertension Father     Medications: Patient's Medications  New Prescriptions   No medications on file  Previous Medications   AMINO ACIDS-PROTEIN HYDROLYS (FEEDING SUPPLEMENT, PRO-STAT SUGAR FREE 64,) LIQD    Take 30 mLs by mouth 2 (two) times daily.   BISACODYL (DULCOLAX) 10 MG SUPPOSITORY    Place 1 suppository (10 mg total) rectally daily as needed for moderate constipation.   CALCIUM ACETATE (PHOSLO) 667 MG CAPSULE    Take 1 capsule (667 mg total) by mouth 3 (three) times daily with meals.   CEFAZOLIN (ANCEF) 2-4 GM/100ML-% IVPB    Inject 100 mLs (2 g total) into the vein Every Tuesday,Thursday,and Saturday with dialysis. End date: 03/24/16.  CINACALCET (SENSIPAR) 60 MG TABLET    Take 60 mg by mouth daily.   HYDROCODONE-ACETAMINOPHEN (NORCO/VICODIN) 5-325 MG TABLET    Take 1 tablet by mouth every 6 (six) hours as needed for moderate pain or severe pain.   ISOSORBIDE MONONITRATE (IMDUR) 60 MG 24 HR TABLET    Take 60 mg by  mouth daily.   LANTHANUM (FOSRENOL) 1000 MG CHEWABLE TABLET    Chew 1,000 mg by mouth 2 (two) times daily with a meal.   MIRTAZAPINE (REMERON SOL-TAB) 15 MG DISINTEGRATING TABLET    Take 15 mg by mouth. Dissolve 1 tablet by mouth at bedtime for appetite.   MULTIVITAMIN (RENA-VIT) TABS TABLET    Take 1 tablet by mouth daily.   NUTRITIONAL SUPPLEMENTS (FEEDING SUPPLEMENT, NEPRO CARB STEADY,) LIQD    Take 237 mLs by mouth 2 (two) times daily between meals.   ONDANSETRON (ZOFRAN ODT) 4 MG DISINTEGRATING TABLET    Take 1 tablet (4 mg total) by mouth every 8 (eight) hours as needed for nausea or vomiting.   PANTOPRAZOLE (PROTONIX) 40 MG TABLET    TAKE 1 TABLET BY MOUTH DAILY   PROCHLORPERAZINE (COMPAZINE) 10 MG TABLET    Take 1 tablet (10 mg total) by mouth every 6 (six) hours as needed (Nausea or vomiting).   PROMETHAZINE (PHENERGAN) 25 MG TABLET    Take 25 mg by mouth every 6 (six) hours as needed for nausea or vomiting.   RANITIDINE (ZANTAC) 300 MG TABLET    Take 300 mg by mouth at bedtime. Reported on 03/05/2016   SIMETHICONE (MYLICON) 076 MG CHEWABLE TABLET    Take 1 tablet by mouth prior to each meal.   VALACYCLOVIR (VALTREX) 500 MG TABLET    Take 1 tablet (500 mg total) by mouth every other day.   ZOLPIDEM (AMBIEN) 5 MG TABLET    Take 1 tablet (5 mg total) by mouth at bedtime as needed for sleep.  Modified Medications   No medications on file  Discontinued Medications   No medications on file     Physical Exam: Filed Vitals:   03/16/16 0841  BP: 134/67  Pulse: 72  Temp: 97.1 F (36.2 C)  TempSrc: Oral  Resp: 19  Height: '5\' 7"'  (1.702 m)  Weight: 129 lb 12.8 oz (58.877 kg)    Physical Exam  Constitutional:  Frail appearing  HENT:  Mouth/Throat: Oropharynx is clear and moist. No oropharyngeal exudate.  Eyes: Pupils are equal, round, and reactive to light. No scleral icterus.  Neck: Neck supple. Carotid bruit is not present.  Cardiovascular: Normal rate, regular rhythm and normal  heart sounds.   Left arm AVF with thrill/audible bruit.  Pulmonary/Chest: Effort normal and breath sounds normal.  Abdominal: Soft. Bowel sounds are normal. There is no hepatomegaly.  Musculoskeletal: She exhibits tenderness (to left knee, pain has improved).  Neurological: She is alert.  AO x 2 in NAD  Skin: Skin is warm and dry. No rash noted.  Psychiatric: She has a normal mood and affect.    Labs reviewed: Basic Metabolic Panel:  Recent Labs  02/18/16 0330  02/24/16 1205 02/25/16 0741  03/12/16 1746 03/13/16 0403 03/14/16 0501  NA 128*  < > 128* 125*  < > 123* 121* 127*  K 5.3*  < > 4.4 4.6  < > 4.7 4.8 3.7  CL 91*  < > 91* 89*  < > 86* 87* 91*  CO2 26  < > 26 24  < > 29 27 29  GLUCOSE 112*  < > 92 98  < > 83 78 75  BUN 44*  < > 44* 66*  < > 28* 32* 13  CREATININE 7.25*  < > 4.98* 6.38*  < > 5.75* 6.21* 3.48*  CALCIUM 9.0  < > 9.4 8.9  < > 7.8* 8.0* 7.8*  MG 2.1  --   --   --   --   --  2.2  --   PHOS 6.4*  < > 6.3* 6.6*  --   --  3.7  --   < > = values in this interval not displayed. Liver Function Tests:  Recent Labs  02/18/16 0330  02/25/16 0741 03/12/16 1746 03/13/16 0403  AST 21  --   --  18 19  ALT 16  --   --  <5* <5*  ALKPHOS 60  --   --  68 72  BILITOT 1.0  --   --  0.5 0.9  PROT 7.7  --   --  6.8 7.2  ALBUMIN 2.7*  < > 1.9* 1.7* 1.9*  < > = values in this interval not displayed. No results for input(s): LIPASE, AMYLASE in the last 8760 hours. No results for input(s): AMMONIA in the last 8760 hours. CBC:  Recent Labs  02/21/16 0328 02/22/16 0400  03/12/16 1746 03/13/16 0212 03/13/16 0403 03/14/16 0501  WBC 2.1* 2.3*  < > 4.3 4.0 4.7 3.8*  NEUTROABS 0.9* 1.3*  --  3.0  --   --   --   HGB 10.5* 10.3*  < > 6.1* 8.0* 7.4* 8.4*  HCT 32.3* 32.7*  < > 19.5* 25.3* 23.7* 26.8*  MCV 105.9* 109.4*  < > 108.9* 102.8* 102.2* 100.8*  PLT 184 200  < > 236 209 224 199  < > = values in this interval not displayed. TSH:  Recent Labs  02/18/16 0330  03/13/16 0403  TSH 0.636 1.457   A1C: Lab Results  Component Value Date   HGBA1C 4.9 03/13/2016   Lipid Panel: No results for input(s): CHOL, HDL, LDLCALC, TRIG, CHOLHDL, LDLDIRECT in the last 8760 hours.   Assessment/Plan 1. Hyponatremia Improved on discharge, Na 127, labs will be monitored at HD, will request labs to be sent to facility  2. Symptomatic anemia Improved, 2 units PRBC. hgb monitored at HD, will request labs to be sent to facility  3. Staphylococcal arthritis of left knee (HCC) conts on Ancef, blood cultures pending   4. Essential hypertension Stable, cont current regime  5. Multiple myeloma, remission status unspecified ([place]) conts to follow up with hematology in [place]  6. ESRD -conts on HD TTS, cinacalcet, phoslo and fosrenol     Shishir Krantz K. Harle Battiest  [facility] [phone] 8 am - 5 pm) [phone] (after hours)

## 2016-03-19 ENCOUNTER — Inpatient Hospital Stay (HOSPITAL_COMMUNITY): Admission: RE | Admit: 2016-03-19 | Payer: Self-pay | Source: Ambulatory Visit

## 2016-03-19 ENCOUNTER — Non-Acute Institutional Stay (SKILLED_NURSING_FACILITY): Payer: Medicare Other | Admitting: Internal Medicine

## 2016-03-19 ENCOUNTER — Encounter: Payer: Self-pay | Admitting: Internal Medicine

## 2016-03-19 DIAGNOSIS — I330 Acute and subacute infective endocarditis: Secondary | ICD-10-CM

## 2016-03-19 DIAGNOSIS — Z992 Dependence on renal dialysis: Secondary | ICD-10-CM | POA: Diagnosis not present

## 2016-03-19 DIAGNOSIS — I10 Essential (primary) hypertension: Secondary | ICD-10-CM | POA: Diagnosis not present

## 2016-03-19 DIAGNOSIS — K219 Gastro-esophageal reflux disease without esophagitis: Secondary | ICD-10-CM

## 2016-03-19 DIAGNOSIS — E871 Hypo-osmolality and hyponatremia: Secondary | ICD-10-CM | POA: Diagnosis not present

## 2016-03-19 DIAGNOSIS — N186 End stage renal disease: Secondary | ICD-10-CM | POA: Diagnosis not present

## 2016-03-19 DIAGNOSIS — D630 Anemia in neoplastic disease: Secondary | ICD-10-CM

## 2016-03-19 DIAGNOSIS — M00062 Staphylococcal arthritis, left knee: Secondary | ICD-10-CM

## 2016-03-19 DIAGNOSIS — E43 Unspecified severe protein-calorie malnutrition: Secondary | ICD-10-CM | POA: Diagnosis not present

## 2016-03-19 DIAGNOSIS — C9000 Multiple myeloma not having achieved remission: Secondary | ICD-10-CM

## 2016-03-19 LAB — CULTURE, BLOOD (ROUTINE X 2)
CULTURE: NO GROWTH
Culture: NO GROWTH

## 2016-03-19 NOTE — Progress Notes (Signed)
DATE: 03/19/16  Location:  [facility]  Nursing Home Room Number: 304 A Place of Service: SNF (31)   Extended Emergency Contact Information Primary Emergency Contact: Wilburn Mylar [place] of [place] Phone: [phone] Mobile Phone: [phone] Relation: Daughter Secondary Emergency Contact: Odie Sera [place] of [place] Mobile Phone: [phone] Relation: Brother  Advanced Directive information Does patient have an advance directive?: No FULL CODE Chief Complaint  Patient presents with  . Readmit To SNF    HPI:  75 yo female seen today as a readmission into SNF following hospital stay for hyponatremia (121), symptomatic anemia, ESRD/HD, HTN, native mitral valve endocarditis (will complete abx 6/24th), left knee septic arthritis with Staph and recent Group B Strep bacteremia, refractory multiple myeloma, anemia in neoplastic disease. She presented to the ED with hgb 6.1 and Na 123. She was transfused total 3 units PRBCs. Na improved with fluid removal. She remains on IV ancef for endocarditis. C diff toxin neg. Hgb at d/c 8.4; Na 127  She c/o diarrhea today. She also c/o left knee swelling but pain is controlled. No nursing issues. No falls. Appetite reduced. Sleeping ok. She is a poor historian due to intermittent confusion. Hx obtained from chart  Insomnia - stable on ambien  ESRD on HD - via left arm AVF on TThSa. followed by nephrology; takes renavite daily; fosrenal 2 gm prior to meals and for Hyperparathyroidism she takes sensipar 60 mg daily   Hypertension - BP stable on imdur 60 mg daily. Has taken norvasc 5 mg daily; apresoline 10 mg three times daily in the past but not listed on d/c summary  GERD - stable on protonix 40 mg daily and mylicon 329 mg with meals; zantac 300 mg nightly   Multiple myeloma relapsed and refractory - presently not on active therapy. Is taking remeron 15 mg nightly to help with her appetite. Has vicodin  5/325 mg 1 or 2 tabs every 6 hours as needed. Takes compazine/zofran prn nausea. Followed by oncology. Takes valtrex for prophylaxis tx. Recent ANC 1.6K; platelets 199K; WBC 3.8K. Hgb 8.4. palliative care/hosoice has been offered to family in past but pt and family have been resistant    Past Medical History  Diagnosis Date  . ESRD (end stage renal disease) ([place]) 03/11/2014  . Hypertension associatd with end stage renal disease on dialysis 03/11/2014  . GERD (gastroesophageal reflux disease) 04/23/2015  . Closed fracture of right distal femur ([place]) 09/01/2015  . Multiple myeloma ([place]) 03/11/2014  . Malnutrition of moderate degree 09/02/2015    Past Surgical History  Procedure Laterality Date  . Abdominal hysterectomy      Pt. denies  . I&d extremity Left 02/27/2016    Procedure: ARTHROSCOPIC IRRIGATION AND DEBRIDEMENT EXTREMITY;  Surgeon: Renette Butters, MD;  Location: [facility];  Service: Orthopedics;  Laterality: Left;  . Tee without cardioversion N/A 02/29/2016    Procedure: TRANSESOPHAGEAL ECHOCARDIOGRAM (TEE);  Surgeon: Thayer Headings, MD;  Location: [facility];  Service: Cardiovascular;  Laterality: N/A;    Patient Care Team: Gildardo Cranker, DO as PCP - General (Internal Medicine)  Social History   Social History  . Marital Status: Married    Spouse Name: N/A  . Number of Children: 3  . Years of Education: N/A   Occupational History  . Retired      Pharmacist, hospital asst.   Social History Main Topics  . Smoking status: Never Smoker   . Smokeless tobacco: Never Used  . Alcohol  Use: No  . Drug Use: No  . Sexual Activity: No   Other Topics Concern  . Not on file   Social History Narrative   Patient recently moved to [place] from [place] in May 2015.   [name] was born in [place], [place].   Patient has brother in [place] area.     reports that she has never smoked. She has never used smokeless tobacco. She reports that she does not drink alcohol or use illicit  drugs.  Family History  Problem Relation Age of Onset  . Hypertension Mother   . Hypertension Father    Family Status  Relation Status Death Age  . Mother Deceased 74  . Father Deceased 30    Immunization History  Administered Date(s) Administered  . Influenza,inj,Quad PF,36+ Mos 07/23/2014  . PPD Test 04/13/2015    No Known Allergies  Medications: Patient's Medications  New Prescriptions   No medications on file  Previous Medications   AMINO ACIDS-PROTEIN HYDROLYS (FEEDING SUPPLEMENT, PRO-STAT SUGAR FREE 64,) LIQD    Take 30 mLs by mouth 2 (two) times daily.   BISACODYL (DULCOLAX) 10 MG SUPPOSITORY    Place 1 suppository (10 mg total) rectally daily as needed for moderate constipation.   CALCIUM ACETATE (PHOSLO) 667 MG CAPSULE    Take 1 capsule (667 mg total) by mouth 3 (three) times daily with meals.   CEFAZOLIN (ANCEF) 2-4 GM/100ML-% IVPB    Inject 100 mLs (2 g total) into the vein Every Tuesday,Thursday,and Saturday with dialysis. End date: 03/24/16.   CINACALCET (SENSIPAR) 60 MG TABLET    Take 60 mg by mouth daily.   HYDROCODONE-ACETAMINOPHEN (NORCO/VICODIN) 5-325 MG TABLET    Take 1 tablet by mouth every 6 (six) hours as needed for moderate pain or severe pain.   ISOSORBIDE MONONITRATE (IMDUR) 60 MG 24 HR TABLET    Take 60 mg by mouth daily.   LANTHANUM (FOSRENOL) 1000 MG CHEWABLE TABLET    Chew 1,000 mg by mouth 2 (two) times daily with a meal.   MIRTAZAPINE (REMERON SOL-TAB) 15 MG DISINTEGRATING TABLET    Take 15 mg by mouth. Dissolve 1 tablet by mouth at bedtime for appetite.   MULTIVITAMIN (RENA-VIT) TABS TABLET    Take 1 tablet by mouth daily.   NUTRITIONAL SUPPLEMENTS (FEEDING SUPPLEMENT, NEPRO CARB STEADY,) LIQD    Take 237 mLs by mouth 2 (two) times daily between meals.   ONDANSETRON (ZOFRAN ODT) 4 MG DISINTEGRATING TABLET    Take 1 tablet (4 mg total) by mouth every 8 (eight) hours as needed for nausea or vomiting.   PANTOPRAZOLE (PROTONIX) 40 MG TABLET    TAKE 1  TABLET BY MOUTH DAILY   PROCHLORPERAZINE (COMPAZINE) 10 MG TABLET    Take 1 tablet (10 mg total) by mouth every 6 (six) hours as needed (Nausea or vomiting).   PROMETHAZINE (PHENERGAN) 25 MG TABLET    Take 25 mg by mouth every 6 (six) hours as needed for nausea or vomiting.   RANITIDINE (ZANTAC) 300 MG TABLET    Take 300 mg by mouth at bedtime. Reported on 03/05/2016   SIMETHICONE (MYLICON) 588 MG CHEWABLE TABLET    Take 1 tablet by mouth prior to each meal.   VALACYCLOVIR (VALTREX) 500 MG TABLET    Take 1 tablet (500 mg total) by mouth every other day.   ZOLPIDEM (AMBIEN) 5 MG TABLET    Take 1 tablet (5 mg total) by mouth at bedtime as needed for sleep.  Modified Medications   No medications on file  Discontinued Medications   No medications on file    Review of Systems  Unable to perform ROS: Other  confused  Filed Vitals:   03/19/16 1119  BP: 134/67  Pulse: 70  Temp: 98.2 F (36.8 C)  TempSrc: Oral  Resp: 20  Height: '5\' 7"'  (1.702 m)  Weight: 129 lb 12.8 oz (58.877 kg)   Body mass index is 20.32 kg/(m^2).  Physical Exam  Constitutional: She appears cachectic. She appears distressed.  Frail appearing, lying in bed with left knee lying on pillow, in NAD.   HENT:  Mouth/Throat: Oropharynx is clear and moist. No oropharyngeal exudate.  Eyes: Pupils are equal, round, and reactive to light. No scleral icterus.  Neck: Neck supple. Carotid bruit is not present. No tracheal deviation present.  Cardiovascular: Normal rate, regular rhythm and intact distal pulses.  Exam reveals no gallop and no friction rub.   Murmur (1/6 SEM) heard. Left arm AVF with thrill/audible bruit. Trace BLE edema. No calf TTP b/l. Extensive soft left leg varicose veins, no palpable nodules  Pulmonary/Chest: Effort normal and breath sounds normal. No stridor. No respiratory distress. She has no wheezes. She has no rales.  Abdominal: Soft. Bowel sounds are normal. She exhibits no distension and no mass. There is  no hepatomegaly. There is no tenderness. There is no rebound and no guarding.  Musculoskeletal: She exhibits edema (L>R knee swelling with reduced ROM. x2 anterior left knee sutures noted. no redness. no increased warmth to touch) and tenderness.  Lymphadenopathy:    She has no cervical adenopathy.  Neurological: She is alert.  AO x 2 in NAD  Skin: Skin is warm and dry. No rash noted.  Psychiatric: She has a normal mood and affect. Her behavior is normal. She is not actively hallucinating (says her friend is sitting on the bed). Thought content is not delusional.     Labs reviewed: Admission on 03/12/2016, Discharged on 03/14/2016  Component Date Value Ref Range Status  . WBC 03/12/2016 4.3  4.0 - 10.5 K/uL Final  . RBC 03/12/2016 1.79* 3.87 - 5.11 MIL/uL Final  . Hemoglobin 03/12/2016 6.1* 12.0 - 15.0 g/dL Final   Comment: REPEATED TO VERIFY CRITICAL RESULT CALLED TO, READ BACK BY AND VERIFIED WITH: M NISER,RN AT 8546 ON 6.12.17 BY W JOHNSON   . HCT 03/12/2016 19.5* 36.0 - 46.0 % Final  . MCV 03/12/2016 108.9* 78.0 - 100.0 fL Final  . MCH 03/12/2016 34.1* 26.0 - 34.0 pg Final  . MCHC 03/12/2016 31.3  30.0 - 36.0 g/dL Final  . RDW 03/12/2016 13.6  11.5 - 15.5 % Final  . Platelets 03/12/2016 236  150 - 400 K/uL Final  . Neutrophils Relative % 03/12/2016 70   Final  . Neutro Abs 03/12/2016 3.0  1.7 - 7.7 K/uL Final  . Lymphocytes Relative 03/12/2016 16   Final  . Lymphs Abs 03/12/2016 0.7  0.7 - 4.0 K/uL Final  . Monocytes Relative 03/12/2016 11   Final  . Monocytes Absolute 03/12/2016 0.5  0.1 - 1.0 K/uL Final  . Eosinophils Relative 03/12/2016 2   Final  . Eosinophils Absolute 03/12/2016 0.1  0.0 - 0.7 K/uL Final  . Basophils Relative 03/12/2016 0   Final  . Basophils Absolute 03/12/2016 0.0  0.0 - 0.1 K/uL Final  . Sodium 03/12/2016 123* 135 - 145 mmol/L Final  . Potassium 03/12/2016 4.7  3.5 - 5.1 mmol/L Final  . Chloride 03/12/2016 86* 101 -  111 mmol/L Final  . CO2  03/12/2016 29  22 - 32 mmol/L Final  . Glucose, Bld 03/12/2016 83  65 - 99 mg/dL Final  . BUN 03/12/2016 28* 6 - 20 mg/dL Final  . Creatinine, Ser 03/12/2016 5.75* 0.44 - 1.00 mg/dL Final  . Calcium 03/12/2016 7.8* 8.9 - 10.3 mg/dL Final  . Total Protein 03/12/2016 6.8  6.5 - 8.1 g/dL Final  . Albumin 03/12/2016 1.7* 3.5 - 5.0 g/dL Final  . AST 03/12/2016 18  15 - 41 U/L Final  . ALT 03/12/2016 <5* 14 - 54 U/L Final  . Alkaline Phosphatase 03/12/2016 68  38 - 126 U/L Final  . Total Bilirubin 03/12/2016 0.5  0.3 - 1.2 mg/dL Final  . GFR calc non Af Amer 03/12/2016 6* >60 mL/min Final  . GFR calc Af Amer 03/12/2016 7* >60 mL/min Final   Comment: (NOTE) The eGFR has been calculated using the CKD EPI equation. This calculation has not been validated in all clinical situations. eGFR's persistently <60 mL/min signify possible Chronic Kidney Disease.   . Anion gap 03/12/2016 8  5 - 15 Final  . ABO/RH(D) 03/12/2016 O POS   Final  . Antibody Screen 03/12/2016 NEG   Final  . Sample Expiration 03/12/2016 03/15/2016   Final  . Unit Number 03/12/2016 [id number]   Final  . Blood Component Type 03/12/2016 RBC LR PHER2   Final  . Unit division 03/12/2016 00   Final  . Status of Unit 03/12/2016 ISSUED,FINAL   Final  . Transfusion Status 03/12/2016 OK TO TRANSFUSE   Final  . Crossmatch Result 03/12/2016 Compatible   Final  . Unit Number 03/12/2016 [id number]   Final  . Blood Component Type 03/12/2016 RBC LR PHER1   Final  . Unit division 03/12/2016 00   Final  . Status of Unit 03/12/2016 ISSUED,FINAL   Final  . Transfusion Status 03/12/2016 OK TO TRANSFUSE   Final  . Crossmatch Result 03/12/2016 Compatible   Final  . Unit Number 03/12/2016 [id number]   Final  . Blood Component Type 03/12/2016 RED CELLS,LR   Final  . Unit division 03/12/2016 00   Final  . Status of Unit 03/12/2016 ISSUED,FINAL   Final  . Transfusion Status 03/12/2016 OK TO TRANSFUSE   Final  . Crossmatch Result  03/12/2016 Compatible   Final  . Order Confirmation 03/12/2016 ORDER PROCESSED BY BLOOD BANK   Final  . WBC 03/13/2016 4.0  4.0 - 10.5 K/uL Final  . RBC 03/13/2016 2.46* 3.87 - 5.11 MIL/uL Final  . Hemoglobin 03/13/2016 8.0* 12.0 - 15.0 g/dL Final   POST TRANSFUSION SPECIMEN  . HCT 03/13/2016 25.3* 36.0 - 46.0 % Final  . MCV 03/13/2016 102.8* 78.0 - 100.0 fL Final  . MCH 03/13/2016 32.5  26.0 - 34.0 pg Final  . MCHC 03/13/2016 31.6  30.0 - 36.0 g/dL Final  . RDW 03/13/2016 17.4* 11.5 - 15.5 % Final  . Platelets 03/13/2016 209  150 - 400 K/uL Final  . Magnesium 03/13/2016 2.2  1.7 - 2.4 mg/dL Final  . Phosphorus 03/13/2016 3.7  2.5 - 4.6 mg/dL Final  . TSH 03/13/2016 1.457  0.350 - 4.500 uIU/mL Final  . Sodium 03/13/2016 121* 135 - 145 mmol/L Final  . Potassium 03/13/2016 4.8  3.5 - 5.1 mmol/L Final  . Chloride 03/13/2016 87* 101 - 111 mmol/L Final  . CO2 03/13/2016 27  22 - 32 mmol/L Final  . Glucose, Bld 03/13/2016 78  65 - 99 mg/dL Final  .  BUN 03/13/2016 32* 6 - 20 mg/dL Final  . Creatinine, Ser 03/13/2016 6.21* 0.44 - 1.00 mg/dL Final  . Calcium 03/13/2016 8.0* 8.9 - 10.3 mg/dL Final  . Total Protein 03/13/2016 7.2  6.5 - 8.1 g/dL Final  . Albumin 03/13/2016 1.9* 3.5 - 5.0 g/dL Final  . AST 03/13/2016 19  15 - 41 U/L Final  . ALT 03/13/2016 <5* 14 - 54 U/L Final  . Alkaline Phosphatase 03/13/2016 72  38 - 126 U/L Final  . Total Bilirubin 03/13/2016 0.9  0.3 - 1.2 mg/dL Final  . GFR calc non Af Amer 03/13/2016 6* >60 mL/min Final  . GFR calc Af Amer 03/13/2016 7* >60 mL/min Final   Comment: (NOTE) The eGFR has been calculated using the CKD EPI equation. This calculation has not been validated in all clinical situations. eGFR's persistently <60 mL/min signify possible Chronic Kidney Disease.   . Anion gap 03/13/2016 7  5 - 15 Final  . WBC 03/13/2016 4.7  4.0 - 10.5 K/uL Final  . RBC 03/13/2016 2.32* 3.87 - 5.11 MIL/uL Final  . Hemoglobin 03/13/2016 7.4* 12.0 - 15.0 g/dL  Final  . HCT 03/13/2016 23.7* 36.0 - 46.0 % Final  . MCV 03/13/2016 102.2* 78.0 - 100.0 fL Final  . MCH 03/13/2016 31.9  26.0 - 34.0 pg Final  . MCHC 03/13/2016 31.2  30.0 - 36.0 g/dL Final  . RDW 03/13/2016 17.6* 11.5 - 15.5 % Final  . Platelets 03/13/2016 224  150 - 400 K/uL Final  . Hgb A1c MFr Bld 03/13/2016 4.9  4.8 - 5.6 % Final   Comment: (NOTE)         Pre-diabetes: 5.7 - 6.4         Diabetes: >6.4         Glycemic control for adults with diabetes: <7.0   . Mean Plasma Glucose 03/13/2016 94   Final   Comment: (NOTE) Performed At: [facility] [place], [place] [id number] Lindon Romp MD [id number]   . C Diff antigen 03/13/2016 POSITIVE* NEGATIVE Final  . C Diff toxin 03/13/2016 NEGATIVE  NEGATIVE Final  . C Diff interpretation 03/13/2016 C. difficile present, but toxin not detected. This indicates colonization. In most cases, this does not require treatment. If patient has signs and symptoms consistent with colitis, consider treatment. Requires ENTERIC precautions.   Final  . MRSA by PCR 03/13/2016 NEGATIVE  NEGATIVE Final   Comment:        The GeneXpert MRSA Assay (FDA approved for NASAL specimens only), is one component of a comprehensive MRSA colonization surveillance program. It is not intended to diagnose MRSA infection nor to guide or monitor treatment for MRSA infections.   . Order Confirmation 03/13/2016 ORDER PROCESSED BY BLOOD BANK   Final  . WBC 03/14/2016 3.8* 4.0 - 10.5 K/uL Final  . RBC 03/14/2016 2.66* 3.87 - 5.11 MIL/uL Final  . Hemoglobin 03/14/2016 8.4* 12.0 - 15.0 g/dL Final  . HCT 03/14/2016 26.8* 36.0 - 46.0 % Final  . MCV 03/14/2016 100.8* 78.0 - 100.0 fL Final  . MCH 03/14/2016 31.6  26.0 - 34.0 pg Final  . MCHC 03/14/2016 31.3  30.0 - 36.0 g/dL Final  . RDW 03/14/2016 18.8* 11.5 - 15.5 % Final  . Platelets 03/14/2016 199  150 - 400 K/uL Final  . Sodium 03/14/2016 127* 135 - 145 mmol/L Final  . Potassium  03/14/2016 3.7  3.5 - 5.1 mmol/L Final   Comment: DELTA CHECK NOTED NO VISIBLE  HEMOLYSIS   . Chloride 03/14/2016 91* 101 - 111 mmol/L Final  . CO2 03/14/2016 29  22 - 32 mmol/L Final  . Glucose, Bld 03/14/2016 75  65 - 99 mg/dL Final  . BUN 03/14/2016 13  6 - 20 mg/dL Final  . Creatinine, Ser 03/14/2016 3.48* 0.44 - 1.00 mg/dL Final   DELTA CHECK NOTED  . Calcium 03/14/2016 7.8* 8.9 - 10.3 mg/dL Final  . GFR calc non Af Amer 03/14/2016 12* >60 mL/min Final  . GFR calc Af Amer 03/14/2016 14* >60 mL/min Final   Comment: (NOTE) The eGFR has been calculated using the CKD EPI equation. This calculation has not been validated in all clinical situations. eGFR's persistently <60 mL/min signify possible Chronic Kidney Disease.   . Anion gap 03/14/2016 7  5 - 15 Final  . Glucose-Capillary 03/14/2016 84  65 - 99 mg/dL Final  . Specimen Description 03/14/2016 BLOOD RIGHT ANTECUBITAL   Final  . Special Requests 03/14/2016 BOTTLES DRAWN AEROBIC ONLY 5CC   Final  . Culture 03/14/2016 NO GROWTH 5 DAYS   Final  . Report Status 03/14/2016 03/19/2016 FINAL   Final  . Specimen Description 03/14/2016 BLOOD RIGHT HAND   Final  . Special Requests 03/14/2016 IN PEDIATRIC BOTTLE 2CC   Final  . Culture 03/14/2016 NO GROWTH 5 DAYS   Final  . Report Status 03/14/2016 03/19/2016 FINAL   Final  . Toxigenic C Difficile by pcr 03/13/2016 NEGATIVE  NEGATIVE Final  Admission on 02/17/2016, Discharged on 03/01/2016  No results displayed because visit has over 200 results.      Dg Fluoro Guided Needle Plc Aspiration/injection Loc  02/26/2016  CLINICAL DATA:  Left knee pain, swelling and increased warmth. Moderate-sized left knee joint effusion on recent radiographs. Sepsis. Clinical concern for infected knee. EXAM: FLUORO GUIDED NEEDLE PLACEMENT CONTRAST:  None used. FLUOROSCOPY TIME:  Radiation Exposure Index (as provided by the fluoroscopic device): Not applicable If the device does not provide the exposure index:  Fluoroscopy Time (in minutes and seconds):  0 minutes 1 second Number of Acquired Images:  1 screen save COMPARISON:  Left knee radiographs dated 02/17/2016. FINDINGS: Using sterile technique, 1% lidocaine and fluoroscopic guidance, an 18 gauge spinal needle was inserted into the patellofemoral joint space using a lateral approach. 30 cc of clear, reddish fluid was withdrawn and sent to the laboratory for testing. The patient tolerated the procedure well with no immediate complications. IMPRESSION: Successful fluoroscopic guided left knee needle aspiration. Electronically Signed   By: Claudie Revering M.D.   On: 02/26/2016 14:02     Assessment/Plan   ICD-9-CM ICD-10-CM   1. Anemia in neoplastic disease 285.22 D63.0    s/p symptomatic anemia --> 3 units PRBCs  2. Hyponatremia - improved at 127 276.1 E87.1   3. Bacterial endocarditis 421.0 I33.0    native MV on IV Ancef  4. Staphylococcal arthritis of left knee (HCC) 711.06 M00.062    041.10    5. Multiple myeloma not having achieved remission (HCC) 203.00 C90.00    refractory  6. Essential hypertension 401.9 I10   7. ESRD on dialysis ([place]) 585.6 N18.6    V45.11 Z99.2    TTHSa  8. Protein-calorie malnutrition, severe ([place]) 262 E43   9. Gastroesophageal reflux disease without esophagitis 530.81 K21.9     Complete IV abx on 6/24th as ordered  Cont HD TThSa as scheduled  F/u with specialists as scheduled  Follow labs - get most recent labs from dialysis center  Cont  other meds as ordered  Nutritional supplements as ordered  PT/OT/ST as ordered  GOAL: long term care. Prognosis is poor due to refractory/relapsed MM. Communicated with pt and nursing.  Will follow  Kennieth Plotts S. Perlie Gold  [facility] [address] [place], [place] [address] [phone] Cell (Monday-Friday 8 AM - 5 PM) [phone] After 5 PM and follow prompts

## 2016-03-27 LAB — FUNGUS CULTURE WITH STAIN

## 2016-03-27 LAB — FUNGAL ORGANISM REFLEX

## 2016-03-27 LAB — FUNGUS CULTURE RESULT

## 2016-03-28 ENCOUNTER — Encounter: Payer: Self-pay | Admitting: Nurse Practitioner

## 2016-03-28 ENCOUNTER — Non-Acute Institutional Stay (SKILLED_NURSING_FACILITY): Payer: Medicare Other | Admitting: Nurse Practitioner

## 2016-03-28 DIAGNOSIS — R630 Anorexia: Secondary | ICD-10-CM

## 2016-03-28 NOTE — Progress Notes (Signed)
Patient ID: [name], female   DOB: 12-02-1940, 75 y.o.   MRN: [id number]    Nursing Home Location:  [place] of Service: SNF (31)  PCP: Gildardo Cranker, DO  No Known Allergies  Chief Complaint  Patient presents with  . Acute Visit    Medication concerns    HPI:  Patient is a 75 y.o. female seen today at [facility] for clarification on Remeron and possible reduction to anxiety medication. Pt with a hx of  multiple meyloma, ESRD/HD TThSa, HTN, leukopenia, anemia due to neopalstic disease, GERD and severe malnutrition.pt was started on Remeron in Dec of 2016. initially she did have some weight gain however she has not had benefit from medication in the last few months and actually has lost weight overall. Staff reports she eats a little bit throughout the day. Offering her nutritional supplements she does not take them routinely.   Review of Systems:  Review of Systems  Constitutional: Negative for activity change, appetite change, fatigue and unexpected weight change.  HENT: Negative for congestion, hearing loss and sore throat.   Eyes: Negative.   Respiratory: Negative for cough and shortness of breath.   Cardiovascular: Negative for chest pain, palpitations and leg swelling.  Gastrointestinal: Negative for abdominal pain, diarrhea and constipation.  Genitourinary: Negative for dysuria.  Musculoskeletal: Negative for myalgias and arthralgias.  Skin: Negative for color change and wound.  Neurological: Negative for dizziness and weakness.  Psychiatric/Behavioral: Negative for behavioral problems, confusion, sleep disturbance and agitation.    Past Medical History  Diagnosis Date  . ESRD (end stage renal disease) ([place]) 03/11/2014  . Hypertension associatd with end stage renal disease on dialysis 03/11/2014  . GERD (gastroesophageal reflux disease) 04/23/2015  . Closed fracture of right distal femur ([place]) 09/01/2015  . Multiple myeloma ([place]) 03/11/2014  .  Malnutrition of moderate degree 09/02/2015   Past Surgical History  Procedure Laterality Date  . Abdominal hysterectomy      Pt. denies  . I&d extremity Left 02/27/2016    Procedure: ARTHROSCOPIC IRRIGATION AND DEBRIDEMENT EXTREMITY;  Surgeon: Renette Butters, MD;  Location: [place];  Service: Orthopedics;  Laterality: Left;  . Tee without cardioversion N/A 02/29/2016    Procedure: TRANSESOPHAGEAL ECHOCARDIOGRAM (TEE);  Surgeon: Thayer Headings, MD;  Location: [place];  Service: Cardiovascular;  Laterality: N/A;   Social History:   reports that she has never smoked. She has never used smokeless tobacco. She reports that she does not drink alcohol or use illicit drugs.  Family History  Problem Relation Age of Onset  . Hypertension Mother   . Hypertension Father     Medications: Patient's Medications  New Prescriptions   No medications on file  Previous Medications   AMINO ACIDS-PROTEIN HYDROLYS (FEEDING SUPPLEMENT, PRO-STAT SUGAR FREE 64,) LIQD    Take 30 mLs by mouth 2 (two) times daily.   BISACODYL (DULCOLAX) 10 MG SUPPOSITORY    Place 1 suppository (10 mg total) rectally daily as needed for moderate constipation.   CALCIUM ACETATE (PHOSLO) 667 MG CAPSULE    Take 1 capsule (667 mg total) by mouth 3 (three) times daily with meals.   CINACALCET (SENSIPAR) 60 MG TABLET    Take 60 mg by mouth daily.   HYDROCODONE-ACETAMINOPHEN (NORCO/VICODIN) 5-325 MG TABLET    Take 1 tablet by mouth every 6 (six) hours as needed for moderate pain or severe pain.   ISOSORBIDE MONONITRATE (IMDUR) 60 MG 24 HR TABLET    Take  60 mg by mouth daily.   LANTHANUM (FOSRENOL) 1000 MG CHEWABLE TABLET    Chew 1,000 mg by mouth 2 (two) times daily with a meal.   MIRTAZAPINE (REMERON SOL-TAB) 15 MG DISINTEGRATING TABLET    Take 15 mg by mouth. Dissolve 1 tablet by mouth at bedtime for appetite.   MULTIVITAMIN (RENA-VIT) TABS TABLET    Take 1 tablet by mouth daily.   NUTRITIONAL SUPPLEMENTS (FEEDING SUPPLEMENT,  NEPRO CARB STEADY,) LIQD    Take 237 mLs by mouth 2 (two) times daily between meals.   ONDANSETRON (ZOFRAN ODT) 4 MG DISINTEGRATING TABLET    Take 1 tablet (4 mg total) by mouth every 8 (eight) hours as needed for nausea or vomiting.   PANTOPRAZOLE (PROTONIX) 40 MG TABLET    TAKE 1 TABLET BY MOUTH DAILY   PROCHLORPERAZINE (COMPAZINE) 10 MG TABLET    Take 1 tablet (10 mg total) by mouth every 6 (six) hours as needed (Nausea or vomiting).   PROMETHAZINE (PHENERGAN) 25 MG TABLET    Take 25 mg by mouth every 6 (six) hours as needed for nausea or vomiting.   RANITIDINE (ZANTAC) 300 MG TABLET    Take 300 mg by mouth at bedtime. Reported on 03/05/2016   SIMETHICONE (MYLICON) 536 MG CHEWABLE TABLET    Take 1 tablet by mouth prior to each meal.   VALACYCLOVIR (VALTREX) 500 MG TABLET    Take 1 tablet (500 mg total) by mouth every other day.   ZOLPIDEM (AMBIEN) 5 MG TABLET    Take 1 tablet (5 mg total) by mouth at bedtime as needed for sleep.  Modified Medications   No medications on file  Discontinued Medications   No medications on file     Physical Exam: Filed Vitals:   03/28/16 1055  BP: 129/75  Pulse: 62  Temp: 97 F (36.1 C)  TempSrc: Oral  Resp: 18  Height: '5\' 7"'  (1.702 m)  Weight: 123 lb 6.4 oz (55.974 kg)    Physical Exam  Constitutional:  Frail appearing  HENT:  Nose: Nose normal.  Mouth/Throat: Oropharynx is clear and moist. No oropharyngeal exudate.  Eyes: No scleral icterus.  Neck: Carotid bruit is not present.  Cardiovascular: Normal rate, regular rhythm and normal heart sounds.   Left arm AVF with thrill/audible bruit.  Pulmonary/Chest: Effort normal and breath sounds normal.  Abdominal: Soft. Bowel sounds are normal. There is no hepatomegaly.  Musculoskeletal: She exhibits no edema.  Neurological: She is alert.  AO x 2  Skin: Skin is warm and dry. No rash noted.  Psychiatric: She has a normal mood and affect.    Labs reviewed: Basic Metabolic Panel:  Recent  Labs  02/18/16 0330  02/24/16 1205 02/25/16 0741  03/12/16 1746 03/13/16 0403 03/14/16 0501  NA 128*  < > 128* 125*  < > 123* 121* 127*  K 5.3*  < > 4.4 4.6  < > 4.7 4.8 3.7  CL 91*  < > 91* 89*  < > 86* 87* 91*  CO2 26  < > 26 24  < > '29 27 29  ' GLUCOSE 112*  < > 92 98  < > 83 78 75  BUN 44*  < > 44* 66*  < > 28* 32* 13  CREATININE 7.25*  < > 4.98* 6.38*  < > 5.75* 6.21* 3.48*  CALCIUM 9.0  < > 9.4 8.9  < > 7.8* 8.0* 7.8*  MG 2.1  --   --   --   --   --  2.2  --   PHOS 6.4*  < > 6.3* 6.6*  --   --  3.7  --   < > = values in this interval not displayed. Liver Function Tests:  Recent Labs  02/18/16 0330  02/25/16 0741 03/12/16 1746 03/13/16 0403  AST 21  --   --  18 19  ALT 16  --   --  <5* <5*  ALKPHOS 60  --   --  68 72  BILITOT 1.0  --   --  0.5 0.9  PROT 7.7  --   --  6.8 7.2  ALBUMIN 2.7*  < > 1.9* 1.7* 1.9*  < > = values in this interval not displayed. No results for input(s): LIPASE, AMYLASE in the last 8760 hours. No results for input(s): AMMONIA in the last 8760 hours. CBC:  Recent Labs  02/21/16 0328 02/22/16 0400  03/12/16 1746 03/13/16 0212 03/13/16 0403 03/14/16 0501  WBC 2.1* 2.3*  < > 4.3 4.0 4.7 3.8*  NEUTROABS 0.9* 1.3*  --  3.0  --   --   --   HGB 10.5* 10.3*  < > 6.1* 8.0* 7.4* 8.4*  HCT 32.3* 32.7*  < > 19.5* 25.3* 23.7* 26.8*  MCV 105.9* 109.4*  < > 108.9* 102.8* 102.2* 100.8*  PLT 184 200  < > 236 209 224 199  < > = values in this interval not displayed. TSH:  Recent Labs  02/18/16 0330 03/13/16 0403  TSH 0.636 1.457   A1C: Lab Results  Component Value Date   HGBA1C 4.9 03/13/2016   Lipid Panel: No results for input(s): CHOL, HDL, LDLCALC, TRIG, CHOLHDL, LDLDIRECT in the last 8760 hours.   Assessment/Plan 1. Loss of appetite Pt reports improved appetite however weight loss noted overall on remeron. Will DC medication at this time since there has not been positive weight gain while on medication.  Will cont to monitor and  staff to encourage PO intake and offer supplements  Dicky Boer K. Harle Battiest  [facility] [phone] 8 am - 5 pm) [phone] (after hours)

## 2016-04-19 LAB — ACID FAST CULTURE WITH REFLEXED SENSITIVITIES: ACID FAST CULTURE - AFSCU3: NEGATIVE

## 2016-04-27 ENCOUNTER — Non-Acute Institutional Stay (SKILLED_NURSING_FACILITY): Payer: Medicare Other | Admitting: Nurse Practitioner

## 2016-04-27 DIAGNOSIS — G4700 Insomnia, unspecified: Secondary | ICD-10-CM | POA: Diagnosis not present

## 2016-04-27 DIAGNOSIS — I10 Essential (primary) hypertension: Secondary | ICD-10-CM

## 2016-04-27 DIAGNOSIS — C9000 Multiple myeloma not having achieved remission: Secondary | ICD-10-CM

## 2016-04-27 DIAGNOSIS — N186 End stage renal disease: Secondary | ICD-10-CM | POA: Diagnosis not present

## 2016-04-27 DIAGNOSIS — E43 Unspecified severe protein-calorie malnutrition: Secondary | ICD-10-CM

## 2016-04-27 DIAGNOSIS — Z992 Dependence on renal dialysis: Secondary | ICD-10-CM

## 2016-04-27 NOTE — Progress Notes (Signed)
Patient ID: [name], female   DOB: 11-May-1941, 75 y.o.   MRN: [id number]    Nursing Home Location:  [place] of Service: SNF (31)  PCP: Gildardo Cranker, DO  No Known Allergies  Chief Complaint  Patient presents with  . Medical Management of Chronic Issues    Routine Visit    HPI:  Patient is a 75 y.o. female seen today at [facility] for routine follow up on chronic conditions. Pt with a hx of  multiple meyloma, ESRD/HD TThSa, HTN, leukopenia, anemia due to neopalstic disease, GERD and severe malnutrition.pt upset today that blood pressure medication was stopped, looking back over record this has been dc since hospitalization. pts blood pressure has been adequately controlled.  Pt also upset over roommate leaving the TV on at night.  Pt also requesting dentures.  Reports she is is able to void. No constipation. Denies swelling.  Staff without any acute concerns today  Review of Systems:  Review of Systems  Constitutional: Negative for activity change, appetite change, fatigue and unexpected weight change.  HENT: Negative for congestion, hearing loss and sore throat.   Eyes: Negative.   Respiratory: Negative for cough and shortness of breath.   Cardiovascular: Negative for chest pain, palpitations and leg swelling.  Gastrointestinal: Negative for abdominal pain, constipation and diarrhea.  Genitourinary: Negative for dysuria.  Musculoskeletal: Negative for arthralgias and myalgias.  Skin: Negative for color change and wound.  Neurological: Negative for dizziness and weakness.  Psychiatric/Behavioral: Positive for agitation and sleep disturbance. Negative for behavioral problems and confusion.    Past Medical History:  Diagnosis Date  . Closed fracture of right distal femur ([place]) 09/01/2015  . ESRD (end stage renal disease) ([place]) 03/11/2014  . GERD (gastroesophageal reflux disease) 04/23/2015  . Hypertension associatd with end stage renal disease on  dialysis 03/11/2014  . Malnutrition of moderate degree 09/02/2015  . Multiple myeloma ([place]) 03/11/2014   Past Surgical History:  Procedure Laterality Date  . ABDOMINAL HYSTERECTOMY     Pt. denies  . I&D EXTREMITY Left 02/27/2016   Procedure: ARTHROSCOPIC IRRIGATION AND DEBRIDEMENT EXTREMITY;  Surgeon: Renette Butters, MD;  Location: [place];  Service: Orthopedics;  Laterality: Left;  . TEE WITHOUT CARDIOVERSION N/A 02/29/2016   Procedure: TRANSESOPHAGEAL ECHOCARDIOGRAM (TEE);  Surgeon: Thayer Headings, MD;  Location: [place];  Service: Cardiovascular;  Laterality: N/A;   Social History:   reports that she has never smoked. She has never used smokeless tobacco. She reports that she does not drink alcohol or use drugs.  Family History  Problem Relation Age of Onset  . Hypertension Mother   . Hypertension Father     Medications: Patient's Medications  New Prescriptions   No medications on file  Previous Medications   AMINO ACIDS-PROTEIN HYDROLYS (FEEDING SUPPLEMENT, PRO-STAT SUGAR FREE 64,) LIQD    Take 30 mLs by mouth 2 (two) times daily.   BISACODYL (DULCOLAX) 10 MG SUPPOSITORY    Place 1 suppository (10 mg total) rectally daily as needed for moderate constipation.   CALCIUM ACETATE (PHOSLO) 667 MG CAPSULE    Take 1 capsule (667 mg total) by mouth 3 (three) times daily with meals.   CINACALCET (SENSIPAR) 60 MG TABLET    Take 60 mg by mouth daily.   HYDROCODONE-ACETAMINOPHEN (NORCO/VICODIN) 5-325 MG TABLET    Take 1 tablet by mouth every 6 (six) hours as needed for moderate pain or severe pain.   ISOSORBIDE MONONITRATE (IMDUR) 60 MG 24 HR  TABLET    Take 60 mg by mouth daily.   MULTIVITAMIN (RENA-VIT) TABS TABLET    Take 1 tablet by mouth daily.   NUTRITIONAL SUPPLEMENTS (FEEDING SUPPLEMENT, NEPRO CARB STEADY,) LIQD    Take 237 mLs by mouth 2 (two) times daily between meals.   ONDANSETRON (ZOFRAN ODT) 4 MG DISINTEGRATING TABLET    Take 1 tablet (4 mg total) by mouth every 8 (eight)  hours as needed for nausea or vomiting.   PANTOPRAZOLE (PROTONIX) 40 MG TABLET    TAKE 1 TABLET BY MOUTH DAILY   PROCHLORPERAZINE (COMPAZINE) 10 MG TABLET    Take 1 tablet (10 mg total) by mouth every 6 (six) hours as needed (Nausea or vomiting).   PROMETHAZINE (PHENERGAN) 25 MG TABLET    Take 25 mg by mouth every 6 (six) hours as needed for nausea or vomiting.   RANITIDINE (ZANTAC) 300 MG TABLET    Take 300 mg by mouth at bedtime. Reported on 03/05/2016   SEVELAMER CARBONATE (RENVELA) 800 MG TABLET    Take 800 mg by mouth 2 (two) times daily.    SIMETHICONE (MYLICON) 756 MG CHEWABLE TABLET    Take 1 tablet by mouth prior to each meal.   VALACYCLOVIR (VALTREX) 500 MG TABLET    Take 1 tablet (500 mg total) by mouth every other day.   ZOLPIDEM (AMBIEN) 5 MG TABLET    Take 1 tablet (5 mg total) by mouth at bedtime as needed for sleep.  Modified Medications   No medications on file  Discontinued Medications   LANTHANUM (FOSRENOL) 1000 MG CHEWABLE TABLET    Chew 1,000 mg by mouth 2 (two) times daily with a meal.   MIRTAZAPINE (REMERON SOL-TAB) 15 MG DISINTEGRATING TABLET    Take 15 mg by mouth. Dissolve 1 tablet by mouth at bedtime for appetite.     Physical Exam: Vitals:   04/27/16 1004  BP: 134/65  Pulse: 65  Resp: 19  Temp: 98 F (36.7 C)  TempSrc: Oral  Weight: 134 lb 9.6 oz (61.1 kg)  Height: '5\' 7"'  (1.702 m)    Physical Exam  Constitutional:  Frail appearing  HENT:  Nose: Nose normal.  Mouth/Throat: Oropharynx is clear and moist. No oropharyngeal exudate.  Eyes: No scleral icterus.  Neck: Carotid bruit is not present.  Cardiovascular: Normal rate, regular rhythm and normal heart sounds.   Left arm AVF with thrill/audible bruit.  Pulmonary/Chest: Effort normal and breath sounds normal.  Abdominal: Soft. Bowel sounds are normal. There is no hepatomegaly.  Musculoskeletal: She exhibits no edema.  Neurological: She is alert.  AO x 2  Skin: Skin is warm and dry. No rash noted.    Psychiatric: She has a normal mood and affect.    Labs reviewed: Basic Metabolic Panel:  Recent Labs  02/18/16 0330  02/24/16 1205 02/25/16 0741  03/12/16 1746 03/13/16 0403 03/14/16 0501  NA 128*  < > 128* 125*  < > 123* 121* 127*  K 5.3*  < > 4.4 4.6  < > 4.7 4.8 3.7  CL 91*  < > 91* 89*  < > 86* 87* 91*  CO2 26  < > 26 24  < > '29 27 29  ' GLUCOSE 112*  < > 92 98  < > 83 78 75  BUN 44*  < > 44* 66*  < > 28* 32* 13  CREATININE 7.25*  < > 4.98* 6.38*  < > 5.75* 6.21* 3.48*  CALCIUM 9.0  < > 9.4  8.9  < > 7.8* 8.0* 7.8*  MG 2.1  --   --   --   --   --  2.2  --   PHOS 6.4*  < > 6.3* 6.6*  --   --  3.7  --   < > = values in this interval not displayed. Liver Function Tests:  Recent Labs  02/18/16 0330  02/25/16 0741 03/12/16 1746 03/13/16 0403  AST 21  --   --  18 19  ALT 16  --   --  <5* <5*  ALKPHOS 60  --   --  68 72  BILITOT 1.0  --   --  0.5 0.9  PROT 7.7  --   --  6.8 7.2  ALBUMIN 2.7*  < > 1.9* 1.7* 1.9*  < > = values in this interval not displayed. No results for input(s): LIPASE, AMYLASE in the last 8760 hours. No results for input(s): AMMONIA in the last 8760 hours. CBC:  Recent Labs  02/21/16 0328 02/22/16 0400  03/12/16 1746 03/13/16 0212 03/13/16 0403 03/14/16 0501  WBC 2.1* 2.3*  < > 4.3 4.0 4.7 3.8*  NEUTROABS 0.9* 1.3*  --  3.0  --   --   --   HGB 10.5* 10.3*  < > 6.1* 8.0* 7.4* 8.4*  HCT 32.3* 32.7*  < > 19.5* 25.3* 23.7* 26.8*  MCV 105.9* 109.4*  < > 108.9* 102.8* 102.2* 100.8*  PLT 184 200  < > 236 209 224 199  < > = values in this interval not displayed. TSH:  Recent Labs  02/18/16 0330 03/13/16 0403  TSH 0.636 1.457   A1C: Lab Results  Component Value Date   HGBA1C 4.9 03/13/2016   Lipid Panel: No results for input(s): CHOL, HDL, LDLCALC, TRIG, CHOLHDL, LDLDIRECT in the last 8760 hours.   Assessment/Plan 1. Essential hypertension -very upset over blood pressure medication being stopped at hospital however blood pressures  reviewed and in appropriate range. conts on imdur daily  2. ESRD on dialysis [facility]) conts on phoslo, sensipar and HC sat, Tuesday, thurs  3. Protein-calorie malnutrition, severe ([place]) -recent weight gain, pt desires dentures. Will sign paperwork for this  4. Insomnia Reports roommate is keeping her up and this makes her upset. Encouraged to talk to facility regarding this -cont ambien PRN  5. Multiple myeloma, remission status unspecified ([place]) conts to follow up at [place] center   [place]. Harle Battiest  [facility] [phone] 8 am - 5 pm) [phone] (after hours)

## 2016-05-10 ENCOUNTER — Other Ambulatory Visit: Payer: Self-pay | Admitting: Nurse Practitioner

## 2016-05-23 ENCOUNTER — Encounter: Payer: Self-pay | Admitting: Nurse Practitioner

## 2016-05-23 NOTE — Progress Notes (Signed)
Patient ID: [name], female   DOB: [birth year], 75 y.o.   MRN: [id number]    Nursing Home Location:  [facility]   Place of Service: SNF (31)  PCP: Unice Cobble, MD  No Known Allergies  Chief Complaint  Patient presents with  . Medical Management of Chronic Issues    Routine Visit    HPI:  Patient is a 75 y.o. female seen today at [facility] for routine follow up on chronic conditions. Pt with a hx of  multiple meyloma, ESRD/HD TThSa, HTN, leukopenia, anemia due to neopalstic disease, GERD and severe malnutrition  Review of Systems:  Review of Systems  Constitutional: Negative for activity change, appetite change, fatigue and unexpected weight change.  HENT: Negative for congestion, hearing loss and sore throat.   Eyes: Negative.   Respiratory: Negative for cough and shortness of breath.   Cardiovascular: Negative for chest pain, palpitations and leg swelling.  Gastrointestinal: Negative for abdominal pain, constipation and diarrhea.  Genitourinary: Negative for dysuria.  Musculoskeletal: Negative for arthralgias and myalgias.  Skin: Negative for color change and wound.  Neurological: Negative for dizziness and weakness.  Psychiatric/Behavioral: Positive for agitation and sleep disturbance. Negative for behavioral problems and confusion.    Past Medical History:  Diagnosis Date  . Closed fracture of right distal femur ([place]) 09/01/2015  . ESRD (end stage renal disease) ([place]) 03/11/2014  . GERD (gastroesophageal reflux disease) 04/23/2015  . Hypertension associatd with end stage renal disease on dialysis 03/11/2014  . Malnutrition of moderate degree 09/02/2015  . Multiple myeloma ([place]) 03/11/2014   Past Surgical History:  Procedure Laterality Date  . ABDOMINAL HYSTERECTOMY     Pt. denies  . I&D EXTREMITY Left 02/27/2016   Procedure: ARTHROSCOPIC IRRIGATION AND DEBRIDEMENT EXTREMITY;  Surgeon: Renette Butters, MD;  Location: [place];  Service: Orthopedics;   Laterality: Left;  . TEE WITHOUT CARDIOVERSION N/A 02/29/2016   Procedure: TRANSESOPHAGEAL ECHOCARDIOGRAM (TEE);  Surgeon: Thayer Headings, MD;  Location: [place];  Service: Cardiovascular;  Laterality: N/A;   Social History:   reports that she has never smoked. She has never used smokeless tobacco. She reports that she does not drink alcohol or use drugs.  Family History  Problem Relation Age of Onset  . Hypertension Mother   . Hypertension Father     Medications: Patient's Medications  New Prescriptions   No medications on file  Previous Medications   AMINO ACIDS-PROTEIN HYDROLYS (FEEDING SUPPLEMENT, PRO-STAT SUGAR FREE 64,) LIQD    Take 30 mLs by mouth 2 (two) times daily.   AMLODIPINE (NORVASC) 5 MG TABLET    Take 5 mg by mouth daily.   BISACODYL (DULCOLAX) 10 MG SUPPOSITORY    Place 1 suppository (10 mg total) rectally daily as needed for moderate constipation.   CALCIUM ACETATE (PHOSLO) 667 MG CAPSULE    Take 1 capsule (667 mg total) by mouth 3 (three) times daily with meals.   CINACALCET (SENSIPAR) 60 MG TABLET    Take 60 mg by mouth daily.   CLONIDINE (CATAPRES) 0.1 MG TABLET    Take 0.1 mg by mouth every 8 (eight) hours as needed. For B/P over 180.   HYDROCODONE-ACETAMINOPHEN (NORCO/VICODIN) 5-325 MG TABLET    Take 1 tablet by mouth every 6 (six) hours as needed for moderate pain or severe pain.   ISOSORBIDE MONONITRATE (IMDUR) 60 MG 24 HR TABLET    Take 60 mg by mouth daily.   LOPERAMIDE HCL (IMODIUM PO)    Give  2 tablets by mouth on dialysis days as needed.   MULTIVITAMIN (RENA-VIT) TABS TABLET    Take 1 tablet by mouth daily.   NUTRITIONAL SUPPLEMENTS (FEEDING SUPPLEMENT, NEPRO CARB STEADY,) LIQD    Take 237 mLs by mouth 2 (two) times daily between meals.   ONDANSETRON (ZOFRAN ODT) 4 MG DISINTEGRATING TABLET    Take 1 tablet (4 mg total) by mouth every 8 (eight) hours as needed for nausea or vomiting.   PANTOPRAZOLE (PROTONIX) 40 MG TABLET    TAKE 1 TABLET BY MOUTH DAILY    PROCHLORPERAZINE (COMPAZINE) 10 MG TABLET    Take 1 tablet (10 mg total) by mouth every 6 (six) hours as needed (Nausea or vomiting).   PROMETHAZINE (PHENERGAN) 25 MG TABLET    Take 25 mg by mouth every 6 (six) hours as needed for nausea or vomiting.   RANITIDINE (ZANTAC) 300 MG TABLET    Take 300 mg by mouth at bedtime. Reported on 03/05/2016   SEVELAMER CARBONATE (RENVELA) 800 MG TABLET    Take 800 mg by mouth 2 (two) times daily.    SIMETHICONE (MYLICON) 338 MG CHEWABLE TABLET    Take 1 tablet by mouth prior to each meal.   VALACYCLOVIR (VALTREX) 500 MG TABLET    Take 1 tablet (500 mg total) by mouth every other day.   ZOLPIDEM (AMBIEN) 5 MG TABLET    Take 1 tablet (5 mg total) by mouth at bedtime as needed for sleep.  Modified Medications   No medications on file  Discontinued Medications   No medications on file     Physical Exam: There were no vitals filed for this visit.  Physical Exam  Constitutional:  Frail appearing  HENT:  Nose: Nose normal.  Mouth/Throat: Oropharynx is clear and moist. No oropharyngeal exudate.  Eyes: No scleral icterus.  Neck: Carotid bruit is not present.  Cardiovascular: Normal rate, regular rhythm and normal heart sounds.   Left arm AVF with thrill/audible bruit.  Pulmonary/Chest: Effort normal and breath sounds normal.  Abdominal: Soft. Bowel sounds are normal. There is no hepatomegaly.  Musculoskeletal: She exhibits no edema.  Neurological: She is alert.  AO x 2  Skin: Skin is warm and dry. No rash noted.  Psychiatric: She has a normal mood and affect.    Labs reviewed: Basic Metabolic Panel:  Recent Labs  02/18/16 0330  02/24/16 1205 02/25/16 0741  03/12/16 1746 03/13/16 0403 03/14/16 0501  NA 128*  < > 128* 125*  < > 123* 121* 127*  K 5.3*  < > 4.4 4.6  < > 4.7 4.8 3.7  CL 91*  < > 91* 89*  < > 86* 87* 91*  CO2 26  < > 26 24  < > '29 27 29  ' GLUCOSE 112*  < > 92 98  < > 83 78 75  BUN 44*  < > 44* 66*  < > 28* 32* 13  CREATININE  7.25*  < > 4.98* 6.38*  < > 5.75* 6.21* 3.48*  CALCIUM 9.0  < > 9.4 8.9  < > 7.8* 8.0* 7.8*  MG 2.1  --   --   --   --   --  2.2  --   PHOS 6.4*  < > 6.3* 6.6*  --   --  3.7  --   < > = values in this interval not displayed. Liver Function Tests:  Recent Labs  02/18/16 0330  02/25/16 0741 03/12/16 1746 03/13/16 0403  AST 21  --   --  18 19  ALT 16  --   --  <5* <5*  ALKPHOS 60  --   --  68 72  BILITOT 1.0  --   --  0.5 0.9  PROT 7.7  --   --  6.8 7.2  ALBUMIN 2.7*  < > 1.9* 1.7* 1.9*  < > = values in this interval not displayed. No results for input(s): LIPASE, AMYLASE in the last 8760 hours. No results for input(s): AMMONIA in the last 8760 hours. CBC:  Recent Labs  02/21/16 0328 02/22/16 0400  03/12/16 1746 03/13/16 0212 03/13/16 0403 03/14/16 0501  WBC 2.1* 2.3*  < > 4.3 4.0 4.7 3.8*  NEUTROABS 0.9* 1.3*  --  3.0  --   --   --   HGB 10.5* 10.3*  < > 6.1* 8.0* 7.4* 8.4*  HCT 32.3* 32.7*  < > 19.5* 25.3* 23.7* 26.8*  MCV 105.9* 109.4*  < > 108.9* 102.8* 102.2* 100.8*  PLT 184 200  < > 236 209 224 199  < > = values in this interval not displayed. TSH:  Recent Labs  02/18/16 0330 03/13/16 0403  TSH 0.636 1.457   A1C: Lab Results  Component Value Date   HGBA1C 4.9 03/13/2016   Lipid Panel: No results for input(s): CHOL, HDL, LDLCALC, TRIG, CHOLHDL, LDLDIRECT in the last 8760 hours.   Assessment/Plan  Carlos American. [place], [place] [facility] [phone] 8 am - 5 pm) [phone] (after hours)   This encounter was created in error - please disregard.

## 2016-05-25 ENCOUNTER — Encounter: Payer: Self-pay | Admitting: Nurse Practitioner

## 2016-05-25 ENCOUNTER — Non-Acute Institutional Stay (SKILLED_NURSING_FACILITY): Payer: Medicare Other | Admitting: Nurse Practitioner

## 2016-05-25 DIAGNOSIS — E440 Moderate protein-calorie malnutrition: Secondary | ICD-10-CM | POA: Diagnosis not present

## 2016-05-25 DIAGNOSIS — I10 Essential (primary) hypertension: Secondary | ICD-10-CM | POA: Diagnosis not present

## 2016-05-25 DIAGNOSIS — G4700 Insomnia, unspecified: Secondary | ICD-10-CM

## 2016-05-25 DIAGNOSIS — C9000 Multiple myeloma not having achieved remission: Secondary | ICD-10-CM | POA: Diagnosis not present

## 2016-05-25 DIAGNOSIS — N186 End stage renal disease: Secondary | ICD-10-CM | POA: Diagnosis not present

## 2016-05-25 NOTE — Progress Notes (Signed)
Patient ID: [name], female   DOB: [birth year], 75 y.o.   MRN: [id number]    Nursing Home Location:  [facility]   Place of Service: SNF (31)  PCP: Unice Cobble, MD  No Known Allergies  Chief Complaint  Patient presents with  . Medical Management of Chronic Issues    Routine Visit    HPI:  Patient is a 75 y.o. female seen today at [facility] for routine follow up on chronic conditions. Pt with a hx of  multiple meyloma, ESRD/HD TThSa, HTN, leukopenia, anemia due to neopalstic disease, GERD and severe malnutrition.pt went to oncologist this week to follow up on her multiple myeloma and for treatment. Pt reports she feels poorly today but could be due to having treatment or she is developing a cold. Overall fatigue and malaise, nothing specific bothering her.  Still reporting poor sleep due to roommates TV. Blood pressure has been running high, staff needing to use PRN catapres when sbp over 180. Pt was started on norvasc 5 mg daily last month. No swelling or adverse effects noted.  Review of Systems:  Review of Systems  Constitutional: Positive for fatigue. Negative for activity change, appetite change and unexpected weight change.  HENT: Negative for congestion, hearing loss and sore throat.   Eyes: Negative.   Respiratory: Negative for cough and shortness of breath.   Cardiovascular: Negative for chest pain, palpitations and leg swelling.  Gastrointestinal: Negative for abdominal pain, constipation and diarrhea.  Genitourinary: Negative for dysuria.  Musculoskeletal: Negative for arthralgias and myalgias.  Skin: Negative for color change and wound.  Neurological: Negative for dizziness and weakness.  Psychiatric/Behavioral: Positive for agitation and sleep disturbance. Negative for behavioral problems and confusion.    Past Medical History:  Diagnosis Date  . Closed fracture of right distal femur ([place]) 09/01/2015  . ESRD (end stage renal disease) ([place])  03/11/2014  . GERD (gastroesophageal reflux disease) 04/23/2015  . Hypertension associatd with end stage renal disease on dialysis 03/11/2014  . Malnutrition of moderate degree 09/02/2015  . Multiple myeloma ([place]) 03/11/2014   Past Surgical History:  Procedure Laterality Date  . ABDOMINAL HYSTERECTOMY     Pt. denies  . I&D EXTREMITY Left 02/27/2016   Procedure: ARTHROSCOPIC IRRIGATION AND DEBRIDEMENT EXTREMITY;  Surgeon: Renette Butters, MD;  Location: [place];  Service: Orthopedics;  Laterality: Left;  . TEE WITHOUT CARDIOVERSION N/A 02/29/2016   Procedure: TRANSESOPHAGEAL ECHOCARDIOGRAM (TEE);  Surgeon: Thayer Headings, MD;  Location: [place];  Service: Cardiovascular;  Laterality: N/A;   Social History:   reports that she has never smoked. She has never used smokeless tobacco. She reports that she does not drink alcohol or use drugs.  Family History  Problem Relation Age of Onset  . Hypertension Mother   . Hypertension Father     Medications: Patient's Medications  New Prescriptions   No medications on file  Previous Medications   AMINO ACIDS-PROTEIN HYDROLYS (FEEDING SUPPLEMENT, PRO-STAT SUGAR FREE 64,) LIQD    Take 30 mLs by mouth 2 (two) times daily.   AMLODIPINE (NORVASC) 5 MG TABLET    Take 5 mg by mouth daily.   BISACODYL (DULCOLAX) 10 MG SUPPOSITORY    Place 1 suppository (10 mg total) rectally daily as needed for moderate constipation.   CALCIUM ACETATE (PHOSLO) 667 MG CAPSULE    Take 1 capsule (667 mg total) by mouth 3 (three) times daily with meals.   CINACALCET (SENSIPAR) 60 MG TABLET    Take 60  mg by mouth daily.   CLONIDINE (CATAPRES) 0.1 MG TABLET    Take 0.1 mg by mouth every 8 (eight) hours as needed. For B/P over 180.   HYDROCODONE-ACETAMINOPHEN (NORCO/VICODIN) 5-325 MG TABLET    Take 1 tablet by mouth every 6 (six) hours as needed for moderate pain or severe pain.   ISOSORBIDE MONONITRATE (IMDUR) 60 MG 24 HR TABLET    Take 60 mg by mouth daily.   LOPERAMIDE HCL  (IMODIUM PO)    Give 2 tablets by mouth on dialysis days as needed.   MULTIVITAMIN (RENA-VIT) TABS TABLET    Take 1 tablet by mouth daily.   NUTRITIONAL SUPPLEMENTS (FEEDING SUPPLEMENT, NEPRO CARB STEADY,) LIQD    Take 237 mLs by mouth 2 (two) times daily between meals.   ONDANSETRON (ZOFRAN ODT) 4 MG DISINTEGRATING TABLET    Take 1 tablet (4 mg total) by mouth every 8 (eight) hours as needed for nausea or vomiting.   PANTOPRAZOLE (PROTONIX) 40 MG TABLET    TAKE 1 TABLET BY MOUTH DAILY   PROCHLORPERAZINE (COMPAZINE) 10 MG TABLET    Take 1 tablet (10 mg total) by mouth every 6 (six) hours as needed (Nausea or vomiting).   PROMETHAZINE (PHENERGAN) 25 MG TABLET    Take 25 mg by mouth every 6 (six) hours as needed for nausea or vomiting.   RANITIDINE (ZANTAC) 300 MG TABLET    Take 300 mg by mouth at bedtime. Reported on 03/05/2016   SEVELAMER CARBONATE (RENVELA) 800 MG TABLET    Take 800 mg by mouth 2 (two) times daily.    SIMETHICONE (MYLICON) 595 MG CHEWABLE TABLET    Take 1 tablet by mouth prior to each meal.   VALACYCLOVIR (VALTREX) 500 MG TABLET    Take 1 tablet (500 mg total) by mouth every other day.   ZOLPIDEM (AMBIEN) 5 MG TABLET    Take 1 tablet (5 mg total) by mouth at bedtime as needed for sleep.  Modified Medications   No medications on file  Discontinued Medications   No medications on file     Physical Exam: Vitals:   05/25/16 1053  BP: 118/71  Pulse: (!) 57  Resp: 18  Temp: 97 F (36.1 C)  Weight: 131 lb 11.2 oz (59.7 kg)  Height: _0  (1.702 m)    Physical Exam  Constitutional:  Frail appearing  HENT:  Nose: Nose normal.  Mouth/Throat: Oropharynx is clear and moist. No oropharyngeal exudate.  Eyes: No scleral icterus.  Neck: Carotid bruit is not present.  Cardiovascular: Normal rate, regular rhythm and normal heart sounds.   Left arm AVF with thrill/audible bruit.  Pulmonary/Chest: Effort normal and breath sounds normal.  Abdominal: Soft. Bowel sounds are normal.  There is no hepatomegaly.  Musculoskeletal: She exhibits no edema.  Neurological: She is alert.  AO x 2  Skin: Skin is warm and dry. No rash noted.  Psychiatric: She has a normal mood and affect.    Labs reviewed: Basic Metabolic Panel:  Recent Labs  02/18/16 0330  02/24/16 1205 02/25/16 0741  03/12/16 1746 03/13/16 0403 03/14/16 0501  NA 128*  < > 128* 125*  < > 123* 121* 127*  K 5.3*  < > 4.4 4.6  < > 4.7 4.8 3.7  CL 91*  < > 91* 89*  < > 86* 87* 91*  CO2 26  < > 26 24  < > _1 GLUCOSE 112*  < > 92 98  < >  83 78 75  BUN 44*  < > 44* 66*  < > 28* 32* 13  CREATININE 7.25*  < > 4.98* 6.38*  < > 5.75* 6.21* 3.48*  CALCIUM 9.0  < > 9.4 8.9  < > 7.8* 8.0* 7.8*  MG 2.1  --   --   --   --   --  2.2  --   PHOS 6.4*  < > 6.3* 6.6*  --   --  3.7  --   < > = values in this interval not displayed. Liver Function Tests:  Recent Labs  02/18/16 0330  02/25/16 0741 03/12/16 1746 03/13/16 0403  AST 21  --   --  18 19  ALT 16  --   --  <5* <5*  ALKPHOS 60  --   --  68 72  BILITOT 1.0  --   --  0.5 0.9  PROT 7.7  --   --  6.8 7.2  ALBUMIN 2.7*  < > 1.9* 1.7* 1.9*  < > = values in this interval not displayed. No results for input(s): LIPASE, AMYLASE in the last 8760 hours. No results for input(s): AMMONIA in the last 8760 hours. CBC:  Recent Labs  02/21/16 0328 02/22/16 0400  03/12/16 1746 03/13/16 0212 03/13/16 0403 03/14/16 0501  WBC 2.1* 2.3*  < > 4.3 4.0 4.7 3.8*  NEUTROABS 0.9* 1.3*  --  3.0  --   --   --   HGB 10.5* 10.3*  < > 6.1* 8.0* 7.4* 8.4*  HCT 32.3* 32.7*  < > 19.5* 25.3* 23.7* 26.8*  MCV 105.9* 109.4*  < > 108.9* 102.8* 102.2* 100.8*  PLT 184 200  < > 236 209 224 199  < > = values in this interval not displayed. TSH:  Recent Labs  02/18/16 0330 03/13/16 0403  TSH 0.636 1.457   A1C: Lab Results  Component Value Date   HGBA1C 4.9 03/13/2016   Lipid Panel: No results for input(s): CHOL, HDL, LDLCALC, TRIG, CHOLHDL, LDLDIRECT in the last  8760 hours.   Assessment/Plan 1. Essential hypertension -will increase norvasc to 10 mg PO daily and cont PRN clonidine for sbp 180  2. ESRD (end stage renal disease) ([place]) Conts on dialysis tues, thurs and Saturday   3. Multiple myeloma, remission status unspecified ([place]) Recent follow up and treatment with oncologist. Feeling increase in fatigue. Will have staff monitor VS q shift for next 3 days and notify if any changes or worsening symptoms  4. Insomnia conts on ambien PRN  5. Malnutrition of moderate degree Pt with positive weight gain. Cont on supplements  Ocean Schildt K. Harle Battiest  [facility] [phone] 8 am - 5 pm) [phone] (after hours)

## 2016-06-15 ENCOUNTER — Encounter: Payer: Self-pay | Admitting: Nurse Practitioner

## 2016-06-15 ENCOUNTER — Non-Acute Institutional Stay (SKILLED_NURSING_FACILITY): Payer: Medicare Other | Admitting: Nurse Practitioner

## 2016-06-15 DIAGNOSIS — N186 End stage renal disease: Secondary | ICD-10-CM | POA: Diagnosis not present

## 2016-06-15 DIAGNOSIS — G4700 Insomnia, unspecified: Secondary | ICD-10-CM | POA: Diagnosis not present

## 2016-06-15 DIAGNOSIS — C9000 Multiple myeloma not having achieved remission: Secondary | ICD-10-CM

## 2016-06-15 DIAGNOSIS — I10 Essential (primary) hypertension: Secondary | ICD-10-CM

## 2016-06-15 DIAGNOSIS — E440 Moderate protein-calorie malnutrition: Secondary | ICD-10-CM | POA: Diagnosis not present

## 2016-06-15 DIAGNOSIS — K219 Gastro-esophageal reflux disease without esophagitis: Secondary | ICD-10-CM

## 2016-06-15 NOTE — Progress Notes (Signed)
Patient ID: Eriyana Sweeten, female   DOB: 1941-04-02, 75 y.o.   MRN: [id number]    Nursing Home Location:  [facility]   Place of Service: SNF (31)  PCP: Unice Cobble, MD  No Known Allergies  Chief Complaint  Patient presents with  . Medical Management of Chronic Issues    Routine Visit    HPI:  Patient is a 75 y.o. female seen today at [facility] for routine follow up on chronic conditions. 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 Reports she stays busy and has been doing well. Nursing does not have any concerns at this time. Pt without complaints.    Review of Systems:  Review of Systems  Constitutional: Positive for fatigue. Negative for activity change, appetite change and unexpected weight change.  HENT: Negative for congestion, hearing loss and sore throat.   Eyes: Negative.   Respiratory: Negative for cough and shortness of breath.   Cardiovascular: Negative for chest pain, palpitations and leg swelling.  Gastrointestinal: Negative for abdominal pain, constipation and diarrhea.  Genitourinary: Negative for dysuria.  Musculoskeletal: Negative for arthralgias and myalgias.  Skin: Negative for color change and wound.  Neurological: Negative for dizziness and weakness.  Psychiatric/Behavioral: Positive for agitation and sleep disturbance. Negative for behavioral problems and confusion.    Past Medical History:  Diagnosis Date  . Closed fracture of right distal femur ([place]) 09/01/2015  . ESRD (end stage renal disease) ([place]) 03/11/2014  . GERD (gastroesophageal reflux disease) 04/23/2015  . Hypertension associatd with end stage renal disease on dialysis 03/11/2014  . Malnutrition of moderate degree 09/02/2015  . Multiple myeloma ([place]) 03/11/2014   Past Surgical History:  Procedure Laterality Date  . ABDOMINAL HYSTERECTOMY     Pt. denies  . I&D EXTREMITY Left 02/27/2016   Procedure: ARTHROSCOPIC IRRIGATION AND DEBRIDEMENT EXTREMITY;  Surgeon: Renette Butters, MD;  Location: [place];  Service: Orthopedics;  Laterality: Left;  . TEE WITHOUT CARDIOVERSION N/A 02/29/2016   Procedure: TRANSESOPHAGEAL ECHOCARDIOGRAM (TEE);  Surgeon: Thayer Headings, MD;  Location: [place];  Service: Cardiovascular;  Laterality: N/A;   Social History:   reports that she has never smoked. She has never used smokeless tobacco. She reports that she does not drink alcohol or use drugs.  Family History  Problem Relation Age of Onset  . Hypertension Mother   . Hypertension Father     Medications: Patient's Medications  New Prescriptions   No medications on file  Previous Medications   AMINO ACIDS-PROTEIN HYDROLYS (FEEDING SUPPLEMENT, PRO-STAT SUGAR FREE 64,) LIQD    Take 30 mLs by mouth 2 (two) times daily.   AMLODIPINE (NORVASC) 10 MG TABLET    Take 10 mg by mouth daily.   BISACODYL (DULCOLAX) 10 MG SUPPOSITORY    Place 1 suppository (10 mg total) rectally daily as needed for moderate constipation.   CALCIUM ACETATE (PHOSLO) 667 MG CAPSULE    Take 1 capsule (667 mg total) by mouth 3 (three) times daily with meals.   CINACALCET (SENSIPAR) 60 MG TABLET    Take 60 mg by mouth daily.   CLONIDINE (CATAPRES) 0.1 MG TABLET    Take 0.1 mg by mouth every 8 (eight) hours as needed. For B/P over 180.   DEXTROMETHORPHAN (DELSYM) 30 MG/5ML LIQUID    Take 30 mg by mouth every 12 (twelve) hours as needed for cough.   HYDROCODONE-ACETAMINOPHEN (NORCO/VICODIN) 5-325 MG TABLET  Take 1 tablet by mouth every 6 (six) hours as needed for moderate pain or severe pain.   ISOSORBIDE MONONITRATE (IMDUR) 60 MG 24 HR TABLET    Take 60 mg by mouth daily.   LOPERAMIDE HCL (IMODIUM PO)    Give 2 tablets by mouth on dialysis days as needed.   MULTIVITAMIN (RENA-VIT) TABS TABLET    Take 1 tablet by mouth daily.   NUTRITIONAL SUPPLEMENTS (FEEDING SUPPLEMENT, NEPRO CARB STEADY,) LIQD    Take 237 mLs by mouth 2  (two) times daily between meals.   ONDANSETRON (ZOFRAN ODT) 4 MG DISINTEGRATING TABLET    Take 1 tablet (4 mg total) by mouth every 8 (eight) hours as needed for nausea or vomiting.   PANTOPRAZOLE (PROTONIX) 40 MG TABLET    TAKE 1 TABLET BY MOUTH DAILY   PROCHLORPERAZINE (COMPAZINE) 10 MG TABLET    Take 1 tablet (10 mg total) by mouth every 6 (six) hours as needed (Nausea or vomiting).   PROMETHAZINE (PHENERGAN) 25 MG TABLET    Take 25 mg by mouth every 6 (six) hours as needed for nausea or vomiting.   RANITIDINE (ZANTAC) 300 MG TABLET    Take 300 mg by mouth at bedtime. Reported on 03/05/2016   SEVELAMER CARBONATE (RENVELA) 800 MG TABLET    Take 800 mg by mouth 2 (two) times daily.    SIMETHICONE (MYLICON) 099 MG CHEWABLE TABLET    Take 1 tablet by mouth prior to each meal.   VALACYCLOVIR (VALTREX) 500 MG TABLET    Take 1 tablet (500 mg total) by mouth every other day.   ZOLPIDEM (AMBIEN) 5 MG TABLET    Take 1 tablet (5 mg total) by mouth at bedtime as needed for sleep.  Modified Medications   No medications on file  Discontinued Medications   AMLODIPINE (NORVASC) 5 MG TABLET    Take 5 mg by mouth daily.     Physical Exam: Vitals:   06/15/16 1315  BP: (!) 142/79  Pulse: 70  Resp: 16  Temp: 97.8 F (36.6 C)  SpO2: 99%  Weight: 131 lb 11.2 oz (59.7 kg)  Height: '5\' 7"'  (1.702 m)    Physical Exam  Constitutional:  Frail appearing  HENT:  Nose: Nose normal.  Mouth/Throat: Oropharynx is clear and moist. No oropharyngeal exudate.  Eyes: No scleral icterus.  Neck: Carotid bruit is not present.  Cardiovascular: Normal rate, regular rhythm and normal heart sounds.   Left arm AVF with thrill/audible bruit.  Pulmonary/Chest: Effort normal and breath sounds normal.  Abdominal: Soft. Bowel sounds are normal. There is no hepatomegaly.  Musculoskeletal: She exhibits no edema.  Neurological: She is alert.  AO x 2  Skin: Skin is warm and dry. No rash noted.  Psychiatric: She has a normal  mood and affect.    Labs reviewed: Basic Metabolic Panel:  Recent Labs  02/18/16 0330  02/24/16 1205 02/25/16 0741  03/12/16 1746 03/13/16 0403 03/14/16 0501  NA 128*  < > 128* 125*  < > 123* 121* 127*  K 5.3*  < > 4.4 4.6  < > 4.7 4.8 3.7  CL 91*  < > 91* 89*  < > 86* 87* 91*  CO2 26  < > 26 24  < > '29 27 29  ' GLUCOSE 112*  < > 92 98  < > 83 78 75  BUN 44*  < > 44* 66*  < > 28* 32* 13  CREATININE 7.25*  < > 4.98* 6.38*  < >  5.75* 6.21* 3.48*  CALCIUM 9.0  < > 9.4 8.9  < > 7.8* 8.0* 7.8*  MG 2.1  --   --   --   --   --  2.2  --   PHOS 6.4*  < > 6.3* 6.6*  --   --  3.7  --   < > = values in this interval not displayed. Liver Function Tests:  Recent Labs  02/18/16 0330  02/25/16 0741 03/12/16 1746 03/13/16 0403  AST 21  --   --  18 19  ALT 16  --   --  <5* <5*  ALKPHOS 60  --   --  68 72  BILITOT 1.0  --   --  0.5 0.9  PROT 7.7  --   --  6.8 7.2  ALBUMIN 2.7*  < > 1.9* 1.7* 1.9*  < > = values in this interval not displayed. No results for input(s): LIPASE, AMYLASE in the last 8760 hours. No results for input(s): AMMONIA in the last 8760 hours. CBC:  Recent Labs  02/21/16 0328 02/22/16 0400  03/12/16 1746 03/13/16 0212 03/13/16 0403 03/14/16 0501  WBC 2.1* 2.3*  < > 4.3 4.0 4.7 3.8*  NEUTROABS 0.9* 1.3*  --  3.0  --   --   --   HGB 10.5* 10.3*  < > 6.1* 8.0* 7.4* 8.4*  HCT 32.3* 32.7*  < > 19.5* 25.3* 23.7* 26.8*  MCV 105.9* 109.4*  < > 108.9* 102.8* 102.2* 100.8*  PLT 184 200  < > 236 209 224 199  < > = values in this interval not displayed. TSH:  Recent Labs  02/18/16 0330 03/13/16 0403  TSH 0.636 1.457   A1C: Lab Results  Component Value Date   HGBA1C 4.9 03/13/2016   Lipid Panel: No results for input(s): CHOL, HDL, LDLCALC, TRIG, CHOLHDL, LDLDIRECT in the last 8760 hours.   Assessment/Plan 1. Essential hypertension Blood pressure stable, some blood pressures low, will have staff hold norvasc for spb <100.   2. Gastroesophageal reflux  disease without esophagitis conts on zantac and protonix, reports ongoing symptoms with medication but states that GERD is much worse without it  3. ESRD (end stage renal disease) ([place]) Dialysis tue, thur, sat conts on phoslo and sensipar   4. Multiple myeloma, remission status unspecified ([place]) Following with oncologist in [place] and goes to [place] for chemo  5. Malnutrition of moderate degree -weight has been stable. Pt reports good appetite. conts on nutritional supplements   6. Insomnia Reports better sleep.   Carlos American. Harle Battiest  [facility] [phone] 8 am - 5 pm) [phone] (after hours)

## 2016-06-26 ENCOUNTER — Encounter: Payer: Self-pay | Admitting: Internal Medicine

## 2016-06-26 ENCOUNTER — Non-Acute Institutional Stay (SKILLED_NURSING_FACILITY): Payer: Medicare Other | Admitting: Internal Medicine

## 2016-06-26 DIAGNOSIS — D630 Anemia in neoplastic disease: Secondary | ICD-10-CM

## 2016-06-26 DIAGNOSIS — I10 Essential (primary) hypertension: Secondary | ICD-10-CM

## 2016-06-26 DIAGNOSIS — I5189 Other ill-defined heart diseases: Secondary | ICD-10-CM | POA: Insufficient documentation

## 2016-06-26 DIAGNOSIS — C9000 Multiple myeloma not having achieved remission: Secondary | ICD-10-CM

## 2016-06-26 DIAGNOSIS — I519 Heart disease, unspecified: Secondary | ICD-10-CM | POA: Diagnosis not present

## 2016-06-26 NOTE — Assessment & Plan Note (Signed)
Low-dose beta blocker initiation

## 2016-06-26 NOTE — Assessment & Plan Note (Signed)
Low-dose beta blocker for systolic hypertension

## 2016-06-26 NOTE — Progress Notes (Signed)
   Facility Location: [facility]  Room Number: 948-N  Code Status: Full Code   This is a nursing facility follow up for specific acute issue of HTN  Interim medical record and care since last [place] visit was updated with review of diagnostic studies and change in clinical status since last visit were documented.  HPI: Blood pressures have averaged over 462 systolic despite amlodipine 10 mg daily and  clonidine 0.1 mg every hours prn for blood pressure over 180. Echocardiogram 02/05/16 revealed mild left ventricle hypertrophy with ejection fraction 65-70 percent. She had grade 1 diastolic dysfunction. Moderate left atrial enlargement. Severe nodular appearing posterior mitral annular calcification was present. There was trivial mitral regurgitation. Mild tricuspid regurgitation was present. Sclerotic aortic valve was present without stenosis. This is in the context of end-stage renal disease for which she receives dialysis Tuesday, Thursday and Saturday. Labs done at dialysis are not readily available. The most recent values in Epic revealed creatinine ranging from 3.48-6.21.  Other diagnoses include multiple myeloma with associated chronic anemia which is normochromic normocytic with hematocrits ranging from 23.7-26.8 in Epic. She states that she receives oral chemotherapy weekly at [place] [facility] and is seen at [facility] by Heme/Onc. She states that labs are drawn at each visit. We have no records of such.  Review of systems: She states that she gets occasional occipital headaches with dialysis. Her edema improves dramatically as does her weight following dialysis 3 times a week. She denies any bleeding dyscrasias. Cardiovascular: No chest pain, palpitations,paroxysmal nocturnal dyspnea, claudication  Respiratory: No cough, sputum production, DOE , significant snoring,apnea   Epistaxis, hemoptysis, hematuria, melena, or rectal bleeding  denied. No unexplained weight loss, significant dyspepsia,dysphagia, or abdominal pain.  There is no abnormal bruising , bleeding, or difficulty stopping bleeding with injury.  Physical exam:  Pertinent or positive findings: She has just returned from dialysis and appears fatigued. Ptosis is present on the right. She has very few remaining teeth which are carious to and below the gumline. She has a grade 1 systolic murmur at the right base. She has 1+ pitting edema. Posterior tibial pulses are decreased. Clubbing of the nailbeds is suggested.  General appearance:Adequately nourished; no acute distress , increased work of breathing is present.   Lymphatic: No lymphadenopathy about the head, neck, axilla . Eyes: No conjunctival inflammation or lid edema is present. There is no scleral icterus. Ears:  External ear exam shows no significant lesions or deformities.   Nose:  External nasal examination shows no deformity or inflammation. Nasal mucosa are pink and moist without lesions ,exudates Oral exam: lips and gums are healthy appearing.There is no oropharyngeal erythema or exudate . Neck:  No thyromegaly, masses, tenderness noted.    Heart:  Normal rate and regular rhythm. S1 and S2 normal without gallop, click, rub .  Lungs:Chest clear to auscultation without wheezes, rhonchi,rales , rubs. Abdomen:Bowel sounds are normal. Abdomen is soft and nontender with no organomegaly, hernias,masses. GU: deferred  Extremities:  No cyanosis, clubbing,edema  Neurologic exam : Strength equal  in upper & lower extremities but generally decreased Balance,Rhomberg,finger to nose testing could not be completed due to clinical state Skin: Warm & dry w/o tenting. No significant lesions or rash.    See summary under each active problem in the Problem List with associated updated therapeutic plan

## 2016-06-26 NOTE — Assessment & Plan Note (Signed)
Request copies of labs done weekly and monthly through [facility]

## 2016-06-26 NOTE — Assessment & Plan Note (Addendum)
Results of [facility] lab results will be requested

## 2016-06-26 NOTE — Patient Instructions (Signed)
Add low-dose beta blocker. Request labs performed each week and month by [facility] Heme/Onc

## 2016-07-13 ENCOUNTER — Non-Acute Institutional Stay (SKILLED_NURSING_FACILITY): Payer: Medicare Other | Admitting: Nurse Practitioner

## 2016-07-13 ENCOUNTER — Encounter: Payer: Self-pay | Admitting: Nurse Practitioner

## 2016-07-13 DIAGNOSIS — N186 End stage renal disease: Secondary | ICD-10-CM

## 2016-07-13 DIAGNOSIS — E43 Unspecified severe protein-calorie malnutrition: Secondary | ICD-10-CM

## 2016-07-13 DIAGNOSIS — I10 Essential (primary) hypertension: Secondary | ICD-10-CM

## 2016-07-13 DIAGNOSIS — R001 Bradycardia, unspecified: Secondary | ICD-10-CM | POA: Diagnosis not present

## 2016-07-13 NOTE — Progress Notes (Signed)
Patient ID: [name], female   DOB: 1941/05/11, 75 y.o.   MRN: [id number]    Nursing Home Location:  [facility]   Place of Service: SNF (31)  PCP: Unice Cobble, MD  No Known Allergies  Chief Complaint  Patient presents with  . Medical Management of Chronic Issues    Routine Visit    HPI:  Patient is a 75 y.o. female seen today at [facility] for routine follow up on chronic conditions. 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  pt was seen last month on 06/26/16 due to hypertension. metoprolol 25 mg BID was added due to sbp in 180s. Dialysis center noted HR in the 40s.  On review HR has been 50-60s today at beside HR 48  Review of Systems:  Review of Systems  Constitutional: Positive for fatigue. Negative for activity change, appetite change and unexpected weight change.  HENT: Negative for congestion, hearing loss and sore throat.   Eyes: Negative.   Respiratory: Negative for cough and shortness of breath.   Cardiovascular: Negative for chest pain, palpitations and leg swelling.  Gastrointestinal: Negative for abdominal pain, constipation and diarrhea.  Genitourinary: Negative for dysuria.  Musculoskeletal: Negative for arthralgias and myalgias.  Skin: Negative for color change and wound.  Neurological: Negative for dizziness and weakness.  Psychiatric/Behavioral: Positive for agitation and sleep disturbance. Negative for behavioral problems and confusion.    Past Medical History:  Diagnosis Date  . Closed fracture of right distal femur ([place]) 09/01/2015  . ESRD (end stage renal disease) ([place]) 03/11/2014  . GERD (gastroesophageal reflux disease) 04/23/2015  . Hypertension associatd with end stage renal disease on dialysis 03/11/2014  . Malnutrition of moderate degree 09/02/2015  . Multiple myeloma ([place]) 03/11/2014   Past Surgical History:  Procedure Laterality Date  .  ABDOMINAL HYSTERECTOMY     Pt. denies  . I&D EXTREMITY Left 02/27/2016   Procedure: ARTHROSCOPIC IRRIGATION AND DEBRIDEMENT EXTREMITY;  Surgeon: Renette Butters, MD;  Location: [place];  Service: Orthopedics;  Laterality: Left;  . TEE WITHOUT CARDIOVERSION N/A 02/29/2016   Procedure: TRANSESOPHAGEAL ECHOCARDIOGRAM (TEE);  Surgeon: Thayer Headings, MD;  Location: [place];  Service: Cardiovascular;  Laterality: N/A;   Social History:   reports that she has never smoked. She has never used smokeless tobacco. She reports that she does not drink alcohol or use drugs.  Family History  Problem Relation Age of Onset  . Hypertension Mother   . Hypertension Father     Medications: Patient's Medications  New Prescriptions   No medications on file  Previous Medications   AMINO ACIDS-PROTEIN HYDROLYS (FEEDING SUPPLEMENT, PRO-STAT SUGAR FREE 64,) LIQD    Take 30 mLs by mouth 2 (two) times daily.   AMLODIPINE (NORVASC) 10 MG TABLET    Take 10 mg by mouth daily.   BISACODYL (DULCOLAX) 10 MG SUPPOSITORY    Place 1 suppository (10 mg total) rectally daily as needed for moderate constipation.   CALCIUM ACETATE (PHOSLO) 667 MG CAPSULE    Take 1 capsule (667 mg total) by mouth 3 (three) times daily with meals.   CINACALCET (SENSIPAR) 60 MG TABLET    Take 60 mg by mouth daily.   CLONIDINE (CATAPRES) 0.1 MG TABLET    Take 0.1 mg by mouth every 8 (eight) hours as needed. For B/P over 180.   DEXTROMETHORPHAN (DELSYM) 30 MG/5ML LIQUID    Take 30 mg  by mouth every 12 (twelve) hours as needed for cough.   HYDROCODONE-ACETAMINOPHEN (NORCO/VICODIN) 5-325 MG TABLET    Take 1 tablet by mouth every 6 (six) hours as needed for moderate pain or severe pain.   ISOSORBIDE MONONITRATE (IMDUR) 60 MG 24 HR TABLET    Take 60 mg by mouth daily.   LOPERAMIDE HCL (IMODIUM PO)    Give 2 tablets by mouth on dialysis days as needed.   METOPROLOL TARTRATE (LOPRESSOR) 25 MG TABLET    Take 25 mg by mouth 2 (two) times daily.    MULTIVITAMIN (RENA-VIT) TABS TABLET    Take 1 tablet by mouth daily.   ONDANSETRON (ZOFRAN ODT) 4 MG DISINTEGRATING TABLET    Take 1 tablet (4 mg total) by mouth every 8 (eight) hours as needed for nausea or vomiting.   PANTOPRAZOLE (PROTONIX) 40 MG TABLET    TAKE 1 TABLET BY MOUTH DAILY   PROCHLORPERAZINE (COMPAZINE) 10 MG TABLET    Take 1 tablet (10 mg total) by mouth every 6 (six) hours as needed (Nausea or vomiting).   PROMETHAZINE (PHENERGAN) 25 MG TABLET    Take 25 mg by mouth every 6 (six) hours as needed for nausea or vomiting.   RANITIDINE (ZANTAC) 300 MG TABLET    Take 300 mg by mouth at bedtime. Reported on 03/05/2016   SEVELAMER CARBONATE (RENVELA) 800 MG TABLET    Take 800 mg by mouth 2 (two) times daily.    SIMETHICONE (MYLICON) 237 MG CHEWABLE TABLET    Take 1 tablet by mouth prior to each meal.   VALACYCLOVIR (VALTREX) 500 MG TABLET    Take 1 tablet (500 mg total) by mouth every other day.   ZOLPIDEM (AMBIEN) 5 MG TABLET    Take 1 tablet (5 mg total) by mouth at bedtime as needed for sleep.  Modified Medications   No medications on file  Discontinued Medications   No medications on file     Physical Exam: Vitals:   07/13/16 1338  BP: 128/71  Pulse: (!) 58  Resp: 20  Temp: 98 F (36.7 C)  Weight: 132 lb 12.8 oz (60.2 kg)  Height: '5\' 7"'  (6.283 m)    Physical Exam  Constitutional:  Frail appearing  HENT:  Nose: Nose normal.  Mouth/Throat: Oropharynx is clear and moist. No oropharyngeal exudate.  Eyes: No scleral icterus.  Neck: Carotid bruit is not present.  Cardiovascular: Normal rate, regular rhythm and normal heart sounds.   Left arm AVF with thrill/audible bruit.  Pulmonary/Chest: Effort normal and breath sounds normal.  Abdominal: Soft. Bowel sounds are normal. There is no hepatomegaly.  Musculoskeletal: She exhibits no edema.  Neurological: She is alert.  AO x 2  Skin: Skin is warm and dry. No rash noted.  Psychiatric: She has a normal mood and affect.     Labs reviewed: Basic Metabolic Panel:  Recent Labs  02/18/16 0330  02/24/16 1205 02/25/16 0741  03/12/16 1746 03/13/16 0403 03/14/16 0501  NA 128*  < > 128* 125*  < > 123* 121* 127*  K 5.3*  < > 4.4 4.6  < > 4.7 4.8 3.7  CL 91*  < > 91* 89*  < > 86* 87* 91*  CO2 26  < > 26 24  < > '29 27 29  ' GLUCOSE 112*  < > 92 98  < > 83 78 75  BUN 44*  < > 44* 66*  < > 28* 32* 13  CREATININE 7.25*  < >  4.98* 6.38*  < > 5.75* 6.21* 3.48*  CALCIUM 9.0  < > 9.4 8.9  < > 7.8* 8.0* 7.8*  MG 2.1  --   --   --   --   --  2.2  --   PHOS 6.4*  < > 6.3* 6.6*  --   --  3.7  --   < > = values in this interval not displayed. Liver Function Tests:  Recent Labs  02/18/16 0330  02/25/16 0741 03/12/16 1746 03/13/16 0403  AST 21  --   --  18 19  ALT 16  --   --  <5* <5*  ALKPHOS 60  --   --  68 72  BILITOT 1.0  --   --  0.5 0.9  PROT 7.7  --   --  6.8 7.2  ALBUMIN 2.7*  < > 1.9* 1.7* 1.9*  < > = values in this interval not displayed. No results for input(s): LIPASE, AMYLASE in the last 8760 hours. No results for input(s): AMMONIA in the last 8760 hours. CBC:  Recent Labs  02/21/16 0328 02/22/16 0400  03/12/16 1746 03/13/16 0212 03/13/16 0403 03/14/16 0501  WBC 2.1* 2.3*  < > 4.3 4.0 4.7 3.8*  NEUTROABS 0.9* 1.3*  --  3.0  --   --   --   HGB 10.5* 10.3*  < > 6.1* 8.0* 7.4* 8.4*  HCT 32.3* 32.7*  < > 19.5* 25.3* 23.7* 26.8*  MCV 105.9* 109.4*  < > 108.9* 102.8* 102.2* 100.8*  PLT 184 200  < > 236 209 224 199  < > = values in this interval not displayed. TSH:  Recent Labs  02/18/16 0330 03/13/16 0403  TSH 0.636 1.457   A1C: Lab Results  Component Value Date   HGBA1C 4.9 03/13/2016   Lipid Panel: No results for input(s): CHOL, HDL, LDLCALC, TRIG, CHOLHDL, LDLDIRECT in the last 8760 hours.   Assessment/Plan 1. Essential hypertension Blood pressure stable, has improved with betablocker however now bradycardic, will adjust metoprolol and hold for HR<60.   2.  Bradycardia Noted after betablocker has been started, will decrease metoprolol to 12.5 mg BID to hold for HR <60   3. ESRD (end stage renal disease) ([place]) conts on dialysis Tuesday, Thursday and Saturday.  conts on phoslo and renvela  4. Protein-calorie malnutrition, severe ([place]) Weight has been stable. Appetite has been good. conts on supplements.     Carlos American. Harle Battiest  [facility] [phone] 8 am - 5 pm) [phone] (after hours)

## 2016-07-19 ENCOUNTER — Non-Acute Institutional Stay (SKILLED_NURSING_FACILITY): Payer: Medicare Other | Admitting: Internal Medicine

## 2016-07-19 ENCOUNTER — Encounter: Payer: Self-pay | Admitting: Internal Medicine

## 2016-07-19 DIAGNOSIS — I120 Hypertensive chronic kidney disease with stage 5 chronic kidney disease or end stage renal disease: Secondary | ICD-10-CM

## 2016-07-19 DIAGNOSIS — K089 Disorder of teeth and supporting structures, unspecified: Secondary | ICD-10-CM

## 2016-07-19 DIAGNOSIS — F39 Unspecified mood [affective] disorder: Secondary | ICD-10-CM

## 2016-07-19 DIAGNOSIS — J069 Acute upper respiratory infection, unspecified: Secondary | ICD-10-CM

## 2016-07-19 DIAGNOSIS — B9789 Other viral agents as the cause of diseases classified elsewhere: Secondary | ICD-10-CM | POA: Diagnosis not present

## 2016-07-19 DIAGNOSIS — N186 End stage renal disease: Secondary | ICD-10-CM | POA: Diagnosis not present

## 2016-07-19 DIAGNOSIS — Z992 Dependence on renal dialysis: Secondary | ICD-10-CM | POA: Diagnosis not present

## 2016-07-19 NOTE — Patient Instructions (Signed)
Up-to-Date reviewed and symptomatically treatment for viral URI prescribed. DC zolpidem & initiate low-dose lorazepam at bedtime for rest and agitation Trial of sertraline for mood disorder Continue to monitor blood pressure average without responding to isolated high and low blood pressures

## 2016-07-19 NOTE — Progress Notes (Signed)
Patient ID: [name], female   DOB: 1941-09-03, 75 y.o.   MRN: [id number] This is a nursing facility follow up for specific acute issues of concerns about blood pressure and persistent "cold".  Interim medical record and care since last [place] visit was updated with review of diagnostic studies and change in clinical status since last visit were documented.  HPI: She is concerned as her blood pressure and pulse drop at dialysis. Pulse has been as low as 49-17 and systolic blood pressure as low as 114. She is very upset because she feels her medications were changed from those she took while living in [place]."They worked fine!".  The staff reports that outburst of anger including profanity directed at them is a recurrent issue with this resident. She states that she has aching in the occipital area when her blood pressure drops low. She has occasional slight edema which improves with dialysis. Because of edema she restricts fluids. She has been producing white nasal discharge and white sputum without other symptoms or signs of an upper or lower respiratory tract infection. She requests "a laxative to clean me out to treat the cough". She was told that she had some wheezing at dialysis. She has no history of asthma and has never smoked. Initially evaluation of rectal pain was also requested, but she does not describe that at this time. She describes occasional diarrhea for which she takes Imodium A-D without other GI symptoms. She is on both a PPI as well as Zantac at bedtime.  Review of systems: She states that she sleeping poorly despite Zolpidem ordered qhs prn. Constitutional: No fever,significant weight change, fatigue  Eyes: No redness, discharge, pain, vision change ENT/mouth: No nasal  purulent discharge, earache,change in hearing ,sore throat  Cardiovascular: No chest pain, palpitations,paroxysmal nocturnal dyspnea, claudication  Respiratory: No hemoptysis, DOE ,  significant snoring,apnea   Gastrointestinal: No heartburn,dysphagia,abdominal pain, nausea / vomiting,rectal bleeding, melena Genitourinary: No dysuria,hematuria, pyuria,  incontinence, nocturia Allergy/immunology: No itchy/ watery eyes, significant sneezing, urticaria, angioedema  Physical exam:  Pertinent or positive findings: The most striking finding is her affect which is one of anger when she is approached. It was difficult to calm her down so I could complete a history and exam. Nares are slightly boggy without purulence. She has terrible dentition. Upper plate is somewhat in disrepair. She has a few lower teeth with caries. She was upset that she's not had dental care while here despite being "on Medicare and Medicaid". She has a rough systolic murmur with a diastolic whiff and increased S2. She has diffuse low-grade rhonchi. Clubbing of the nailbeds is suggested. Pedal pulses are slightly decreased. She has trace edema.  General appearance:Adequately nourished; no acute distress , increased work of breathing is present.   Lymphatic: No lymphadenopathy about the head, neck, axilla Eyes: No conjunctival inflammation or lid edema is present. There is no scleral icterus. Ears:  External ear exam shows no significant lesions or deformities. Poor visualization of the tympanic membranes Nose:  External nasal examination shows no deformity or inflammation.  Oral exam: lips and gums are healthy appearing.There is no oropharyngeal erythema or exudate . Neck:  No thyromegaly, masses, tenderness noted.    Heart:  Normal rate and regular rhythm. S1 normal without gallop,click, rub .  Abdomen:Bowel sounds are normal. Abdomen is soft and nontender with no organomegaly, hernias,masses. GU: deferred  Extremities:  No cyanosis Neurologic exam : Strength equal  in upper & lower extremities Skin: Warm &  dry w/o tenting. No significant lesions or rash.  See summary under each active problem in the  Problem List with associated updated therapeutic plan

## 2016-07-19 NOTE — Assessment & Plan Note (Signed)
Trial of sertraline with titration a some clinical response Trial of lorazepam at bedtime in place of the zolpidem.

## 2016-07-19 NOTE — Assessment & Plan Note (Signed)
Blood pressure has varied from 97/59-163/60. For the most part the blood pressures tend to be 091-068 systolic. The concept of treating average rather than a pressure highs and lows to prevent complications was discussed with her. The blood pressure averages appears acceptable especially in view of dialysis 3 times a week which will result in significant blood pressure fluctuations.

## 2016-08-10 ENCOUNTER — Non-Acute Institutional Stay (SKILLED_NURSING_FACILITY): Payer: Medicare Other | Admitting: Nurse Practitioner

## 2016-08-10 ENCOUNTER — Encounter: Payer: Self-pay | Admitting: Nurse Practitioner

## 2016-08-10 DIAGNOSIS — F39 Unspecified mood [affective] disorder: Secondary | ICD-10-CM | POA: Diagnosis not present

## 2016-08-10 DIAGNOSIS — R001 Bradycardia, unspecified: Secondary | ICD-10-CM | POA: Diagnosis not present

## 2016-08-10 DIAGNOSIS — I10 Essential (primary) hypertension: Secondary | ICD-10-CM

## 2016-08-10 DIAGNOSIS — E43 Unspecified severe protein-calorie malnutrition: Secondary | ICD-10-CM | POA: Diagnosis not present

## 2016-08-10 DIAGNOSIS — N186 End stage renal disease: Secondary | ICD-10-CM | POA: Diagnosis not present

## 2016-08-10 NOTE — Progress Notes (Signed)
Patient ID: [name], female   DOB: 1941/06/12, 75 y.o.   MRN: [id number]    Nursing Home Location:  [facility]   Place of Service: SNF (31)  PCP: Unice Cobble, MD  No Known Allergies  Chief Complaint  Patient presents with  . Medical Management of Chronic Issues    Routine Visit    HPI:  Patient is a 75 y.o. female seen today at [facility] for routine follow up on chronic conditions. Pt with a hx of  multiple meyloma, ESRD/HD TThSa, HTN, leukopenia, anemia due to neopalstic disease, GERD and severe malnutrition.pt reports she is doing fine today and has no complaints. Staff reports she is declining to take her metoprolol and protein supplement. When asked about this pt gets very angry and starts yelling and could not be redirected.   Review of Systems:  Review of Systems  Constitutional: Negative for activity change, appetite change, fatigue and unexpected weight change.  HENT: Negative for congestion, hearing loss and sore throat.   Eyes: Negative.   Respiratory: Negative for cough and shortness of breath.   Cardiovascular: Negative for chest pain, palpitations and leg swelling.  Gastrointestinal: Negative for abdominal pain, constipation and diarrhea.  Genitourinary: Negative for dysuria.  Musculoskeletal: Negative for arthralgias and myalgias.  Skin: Negative for color change and wound.  Neurological: Negative for dizziness and weakness.  Psychiatric/Behavioral: Positive for agitation. Negative for behavioral problems and confusion.    Past Medical History:  Diagnosis Date  . Closed fracture of right distal femur ([place]) 09/01/2015  . ESRD (end stage renal disease) ([place]) 03/11/2014  . GERD (gastroesophageal reflux disease) 04/23/2015  . Hypertension associatd with end stage renal disease on dialysis 03/11/2014  . Malnutrition of moderate degree 09/02/2015  . Multiple myeloma ([place]) 03/11/2014   Past Surgical History:  Procedure Laterality Date  .  ABDOMINAL HYSTERECTOMY     Pt. denies  . I&D EXTREMITY Left 02/27/2016   Procedure: ARTHROSCOPIC IRRIGATION AND DEBRIDEMENT EXTREMITY;  Surgeon: Renette Butters, MD;  Location: [place];  Service: Orthopedics;  Laterality: Left;  . TEE WITHOUT CARDIOVERSION N/A 02/29/2016   Procedure: TRANSESOPHAGEAL ECHOCARDIOGRAM (TEE);  Surgeon: Thayer Headings, MD;  Location: [place];  Service: Cardiovascular;  Laterality: N/A;   Social History:   reports that she has never smoked. She has never used smokeless tobacco. She reports that she does not drink alcohol or use drugs.  Family History  Problem Relation Age of Onset  . Hypertension Mother   . Hypertension Father     Medications: Patient's Medications  New Prescriptions   No medications on file  Previous Medications   AMINO ACIDS-PROTEIN HYDROLYS (FEEDING SUPPLEMENT, PRO-STAT SUGAR FREE 64,) LIQD    Take 30 mLs by mouth 2 (two) times daily.   AMLODIPINE (NORVASC) 10 MG TABLET    Take 10 mg by mouth daily.   BISACODYL (DULCOLAX) 10 MG SUPPOSITORY    Place 1 suppository (10 mg total) rectally daily as needed for moderate constipation.   CALCIUM ACETATE (PHOSLO) 667 MG CAPSULE    Take 1 capsule (667 mg total) by mouth 3 (three) times daily with meals.   CINACALCET (SENSIPAR) 60 MG TABLET    Take 60 mg by mouth daily.   CLONIDINE (CATAPRES) 0.1 MG TABLET    Take 0.1 mg by mouth every 8 (eight) hours as needed. For B/P over 180.   DEXTROMETHORPHAN (DELSYM) 30 MG/5ML LIQUID    Take 30 mg by mouth every 12 (twelve) hours as  needed for cough.   HYDROCODONE-ACETAMINOPHEN (NORCO/VICODIN) 5-325 MG TABLET    Take 1 tablet by mouth every 6 (six) hours as needed for moderate pain or severe pain.   ISOSORBIDE MONONITRATE (IMDUR) 60 MG 24 HR TABLET    Take 60 mg by mouth daily.   LOPERAMIDE HCL (IMODIUM PO)    Give 2 tablets by mouth on dialysis days as needed.   LORAZEPAM (ATIVAN) 0.5 MG TABLET    Take 0.5 mg by mouth at bedtime.   METOPROLOL TARTRATE  (LOPRESSOR) 25 MG TABLET    Take 12.5 mg by mouth 2 (two) times daily.   MULTIVITAMIN (RENA-VIT) TABS TABLET    Take 1 tablet by mouth daily.   ONDANSETRON (ZOFRAN ODT) 4 MG DISINTEGRATING TABLET    Take 1 tablet (4 mg total) by mouth every 8 (eight) hours as needed for nausea or vomiting.   PANTOPRAZOLE (PROTONIX) 40 MG TABLET    TAKE 1 TABLET BY MOUTH DAILY   PROCHLORPERAZINE (COMPAZINE) 10 MG TABLET    Take 1 tablet (10 mg total) by mouth every 6 (six) hours as needed (Nausea or vomiting).   PROMETHAZINE (PHENERGAN) 25 MG TABLET    Take 25 mg by mouth every 6 (six) hours as needed for nausea or vomiting.   RANITIDINE (ZANTAC) 300 MG TABLET    Take 300 mg by mouth at bedtime. Reported on 03/05/2016   SERTRALINE (ZOLOFT) 25 MG TABLET    Take 25 mg by mouth daily.   SEVELAMER CARBONATE (RENVELA) 800 MG TABLET    Take 800 mg by mouth 2 (two) times daily.    SIMETHICONE (MYLICON) 425 MG CHEWABLE TABLET    Take 1 tablet by mouth prior to each meal.   VALACYCLOVIR (VALTREX) 500 MG TABLET    Take 1 tablet (500 mg total) by mouth every other day.  Modified Medications   No medications on file  Discontinued Medications   METOPROLOL TARTRATE (LOPRESSOR) 25 MG TABLET    Take 25 mg by mouth 2 (two) times daily.   ZOLPIDEM (AMBIEN) 5 MG TABLET    Take 1 tablet (5 mg total) by mouth at bedtime as needed for sleep.     Physical Exam: Vitals:   08/10/16 0913  BP: 130/85  Pulse: 62  Resp: 18  Temp: 98 F (36.7 C)  SpO2: 99%  Weight: 132 lb (59.9 kg)  Height: _0  (1.702 m)    Physical Exam  Constitutional:  Frail appearing  HENT:  Nose: Nose normal.  Mouth/Throat: Oropharynx is clear and moist. No oropharyngeal exudate.  Eyes: No scleral icterus.  Neck: Carotid bruit is not present.  Cardiovascular: Normal rate, regular rhythm and normal heart sounds.   Left arm AVF with thrill/audible bruit.  Pulmonary/Chest: Effort normal and breath sounds normal.  Abdominal: Soft. Bowel sounds are  normal. There is no hepatomegaly.  Musculoskeletal: She exhibits no edema.  Neurological: She is alert.  AO x 2  Skin: Skin is warm and dry. No rash noted.  Psychiatric: She has a normal mood and affect.    Labs reviewed: Basic Metabolic Panel:  Recent Labs  02/18/16 0330  02/24/16 1205 02/25/16 0741  03/12/16 1746 03/13/16 0403 03/14/16 0501  NA 128*  < > 128* 125*  < > 123* 121* 127*  K 5.3*  < > 4.4 4.6  < > 4.7 4.8 3.7  CL 91*  < > 91* 89*  < > 86* 87* 91*  CO2 26  < > 26 24  < >  _0 GLUCOSE 112*  < > 92 98  < > 83 78 75  BUN 44*  < > 44* 66*  < > 28* 32* 13  CREATININE 7.25*  < > 4.98* 6.38*  < > 5.75* 6.21* 3.48*  CALCIUM 9.0  < > 9.4 8.9  < > 7.8* 8.0* 7.8*  MG 2.1  --   --   --   --   --  2.2  --   PHOS 6.4*  < > 6.3* 6.6*  --   --  3.7  --   < > = values in this interval not displayed. Liver Function Tests:  Recent Labs  02/18/16 0330  02/25/16 0741 03/12/16 1746 03/13/16 0403  AST 21  --   --  18 19  ALT 16  --   --  <5* <5*  ALKPHOS 60  --   --  68 72  BILITOT 1.0  --   --  0.5 0.9  PROT 7.7  --   --  6.8 7.2  ALBUMIN 2.7*  < > 1.9* 1.7* 1.9*  < > = values in this interval not displayed. No results for input(s): LIPASE, AMYLASE in the last 8760 hours. No results for input(s): AMMONIA in the last 8760 hours. CBC:  Recent Labs  02/21/16 0328 02/22/16 0400  03/12/16 1746 03/13/16 0212 03/13/16 0403 03/14/16 0501  WBC 2.1* 2.3*  < > 4.3 4.0 4.7 3.8*  NEUTROABS 0.9* 1.3*  --  3.0  --   --   --   HGB 10.5* 10.3*  < > 6.1* 8.0* 7.4* 8.4*  HCT 32.3* 32.7*  < > 19.5* 25.3* 23.7* 26.8*  MCV 105.9* 109.4*  < > 108.9* 102.8* 102.2* 100.8*  PLT 184 200  < > 236 209 224 199  < > = values in this interval not displayed. TSH:  Recent Labs  02/18/16 0330 03/13/16 0403  TSH 0.636 1.457   A1C: Lab Results  Component Value Date   HGBA1C 4.9 03/13/2016   Lipid Panel: No results for input(s): CHOL, HDL, LDLCALC, TRIG, CHOLHDL, LDLDIRECT in the  last 8760 hours.   Assessment/Plan 1. Mood disorder ([place]) Pt tolerating zoloft, still with outburst, will increase to 50 mg daily at this time   2. ESRD (end stage renal disease) ([place]) conts HD tues, Thursday and Saturday.   3. Protein-calorie malnutrition, severe ([place]) Weights have been stable, pt refusing supplements because "she doesn't need them" when trying to explain nutritional status in regards to protein she started yelling and getting angry.   4. Bradycardia -will dc metoprolol at this time due to bradycardia and refusal.   5. Essential hypertension Blood pressure is stable, pt frequently refuses metoprolol and bradycardia is an issue. Will DC metoprolol at this time and monitor VS  Shatasia Cutshaw K. Harle Battiest  [facility] [phone] 8 am - 5 pm) [phone] (after hours)

## 2016-09-05 ENCOUNTER — Encounter: Payer: Self-pay | Admitting: Nurse Practitioner

## 2016-09-05 ENCOUNTER — Non-Acute Institutional Stay (SKILLED_NURSING_FACILITY): Payer: Medicare Other | Admitting: Nurse Practitioner

## 2016-09-05 DIAGNOSIS — F39 Unspecified mood [affective] disorder: Secondary | ICD-10-CM | POA: Diagnosis not present

## 2016-09-05 DIAGNOSIS — I10 Essential (primary) hypertension: Secondary | ICD-10-CM | POA: Diagnosis not present

## 2016-09-05 DIAGNOSIS — R001 Bradycardia, unspecified: Secondary | ICD-10-CM

## 2016-09-05 DIAGNOSIS — N186 End stage renal disease: Secondary | ICD-10-CM | POA: Diagnosis not present

## 2016-09-05 DIAGNOSIS — K219 Gastro-esophageal reflux disease without esophagitis: Secondary | ICD-10-CM | POA: Diagnosis not present

## 2016-09-05 NOTE — Progress Notes (Signed)
Patient ID: [name], female   DOB: 30-Aug-1941, 75 y.o.   MRN: [id number]    Nursing Home Location:  [facility]   Place of Service: SNF (31)  PCP: Unice Cobble, MD  No Known Allergies  Chief Complaint  Patient presents with  . Medical Management of Chronic Issues    Routine Visit    HPI:  Patient is a 75 y.o. female seen today at [facility] for routine follow up on chronic conditions. Pt with a hx of  multiple meyloma, ESRD/HD TThSa, HTN, leukopenia, anemia due to neopalstic disease, GERD and severe malnutrition.last month pt was seen by psych and Lamictal was added. Staff reports pt with less agitation overall. Bradycardia has improved with betablocker being stopped. Pt reports she is doing well and has no concerns.   Review of Systems:  Review of Systems  Constitutional: Negative for activity change, appetite change, fatigue and unexpected weight change.  HENT: Negative for congestion, hearing loss and sore throat.   Eyes: Negative.   Respiratory: Negative for cough and shortness of breath.   Cardiovascular: Negative for chest pain, palpitations and leg swelling.  Gastrointestinal: Negative for abdominal pain, constipation and diarrhea.  Genitourinary: Negative for dysuria.  Musculoskeletal: Negative for arthralgias and myalgias.  Skin: Negative for color change and wound.  Neurological: Negative for dizziness and weakness.  Psychiatric/Behavioral: Positive for agitation. Negative for behavioral problems and confusion.    Past Medical History:  Diagnosis Date  . Closed fracture of right distal femur ([place]) 09/01/2015  . ESRD (end stage renal disease) ([place]) 03/11/2014  . GERD (gastroesophageal reflux disease) 04/23/2015  . Hypertension associatd with end stage renal disease on dialysis 03/11/2014  . Malnutrition of moderate degree 09/02/2015  . Multiple myeloma ([place]) 03/11/2014   Past Surgical History:  Procedure Laterality Date  . ABDOMINAL HYSTERECTOMY      Pt. denies  . I&D EXTREMITY Left 02/27/2016   Procedure: ARTHROSCOPIC IRRIGATION AND DEBRIDEMENT EXTREMITY;  Surgeon: Renette Butters, MD;  Location: [place];  Service: Orthopedics;  Laterality: Left;  . TEE WITHOUT CARDIOVERSION N/A 02/29/2016   Procedure: TRANSESOPHAGEAL ECHOCARDIOGRAM (TEE);  Surgeon: Thayer Headings, MD;  Location: [place];  Service: Cardiovascular;  Laterality: N/A;   Social History:   reports that she has never smoked. She has never used smokeless tobacco. She reports that she does not drink alcohol or use drugs.  Family History  Problem Relation Age of Onset  . Hypertension Mother   . Hypertension Father     Medications: Patient's Medications  New Prescriptions   No medications on file  Previous Medications   AMINO ACIDS-PROTEIN HYDROLYS (FEEDING SUPPLEMENT, PRO-STAT SUGAR FREE 64,) LIQD    Take 30 mLs by mouth 2 (two) times daily.   AMLODIPINE (NORVASC) 10 MG TABLET    Take 10 mg by mouth daily.   BISACODYL (DULCOLAX) 10 MG SUPPOSITORY    Place 1 suppository (10 mg total) rectally daily as needed for moderate constipation.   CALCIUM ACETATE (PHOSLO) 667 MG CAPSULE    Take 1 capsule (667 mg total) by mouth 3 (three) times daily with meals.   CINACALCET (SENSIPAR) 60 MG TABLET    Take 60 mg by mouth daily.   CLONIDINE (CATAPRES) 0.1 MG TABLET    Take 0.1 mg by mouth every 8 (eight) hours as needed. For B/P over 180.   HYDROCODONE-ACETAMINOPHEN (NORCO/VICODIN) 5-325 MG TABLET    Take 1 tablet by mouth every 6 (six) hours as needed for moderate pain  or severe pain.   ISOSORBIDE MONONITRATE (IMDUR) 60 MG 24 HR TABLET    Take 60 mg by mouth daily.   LAMOTRIGINE (LAMICTAL) 25 MG TABLET    Give 25 mg by mouth daily at bedtime for 7 days (until 09/11/16) then give 50 mg daily at bedtime to stabilize mood.   LOPERAMIDE HCL (IMODIUM PO)    Give 2 tablets by mouth on dialysis days as needed.   LORAZEPAM (ATIVAN) 0.5 MG TABLET    Take 0.5 mg by mouth at bedtime.    MULTIVITAMIN (RENA-VIT) TABS TABLET    Take 1 tablet by mouth daily.   ONDANSETRON (ZOFRAN ODT) 4 MG DISINTEGRATING TABLET    Take 1 tablet (4 mg total) by mouth every 8 (eight) hours as needed for nausea or vomiting.   PANTOPRAZOLE (PROTONIX) 40 MG TABLET    TAKE 1 TABLET BY MOUTH DAILY   PROMETHAZINE (PHENERGAN) 25 MG TABLET    Take 25 mg by mouth every 6 (six) hours as needed for nausea or vomiting.   RANITIDINE (ZANTAC) 300 MG TABLET    Take 300 mg by mouth at bedtime. Reported on 03/05/2016   SERTRALINE (ZOLOFT) 50 MG TABLET    Take 50 mg by mouth daily.   SEVELAMER CARBONATE (RENVELA) 800 MG TABLET    Take 800 mg by mouth 2 (two) times daily.    SIMETHICONE (MYLICON) 341 MG CHEWABLE TABLET    Take 1 tablet by mouth prior to each meal.   VALACYCLOVIR (VALTREX) 500 MG TABLET    Take 1 tablet (500 mg total) by mouth every other day.  Modified Medications   No medications on file  Discontinued Medications   DEXTROMETHORPHAN (DELSYM) 30 MG/5ML LIQUID    Take 30 mg by mouth every 12 (twelve) hours as needed for cough.   METOPROLOL TARTRATE (LOPRESSOR) 25 MG TABLET    Take 12.5 mg by mouth 2 (two) times daily.   PROCHLORPERAZINE (COMPAZINE) 10 MG TABLET    Take 1 tablet (10 mg total) by mouth every 6 (six) hours as needed (Nausea or vomiting).   SERTRALINE (ZOLOFT) 25 MG TABLET    Take 25 mg by mouth daily.     Physical Exam: Vitals:   09/05/16 1436  BP: (!) 142/80  Pulse: (!) 58  Resp: 18  Temp: 97.6 F (36.4 C)  SpO2: 100%  Weight: 132 lb (59.9 kg)  Height: 5' 7" (1.702 m)    Physical Exam  Constitutional:  Frail appearing  HENT:  Nose: Nose normal.  Mouth/Throat: Oropharynx is clear and moist. No oropharyngeal exudate.  Eyes: No scleral icterus.  Neck: Carotid bruit is not present.  Cardiovascular: Normal rate, regular rhythm and normal heart sounds.   Left arm AVF with thrill/audible bruit.  Pulmonary/Chest: Effort normal and breath sounds normal.  Abdominal: Soft. Bowel  sounds are normal. There is no hepatomegaly.  Musculoskeletal: She exhibits no edema.  Neurological: She is alert.  AO x 2  Skin: Skin is warm and dry. No rash noted.  Psychiatric: She has a normal mood and affect.    Labs reviewed: Basic Metabolic Panel:  Recent Labs  02/18/16 0330  02/24/16 1205 02/25/16 0741  03/12/16 1746 03/13/16 0403 03/14/16 0501  NA 128*  < > 128* 125*  < > 123* 121* 127*  K 5.3*  < > 4.4 4.6  < > 4.7 4.8 3.7  CL 91*  < > 91* 89*  < > 86* 87* 91*  CO2 26  < >  26 24  < > _0 GLUCOSE 112*  < > 92 98  < > 83 78 75  BUN 44*  < > 44* 66*  < > 28* 32* 13  CREATININE 7.25*  < > 4.98* 6.38*  < > 5.75* 6.21* 3.48*  CALCIUM 9.0  < > 9.4 8.9  < > 7.8* 8.0* 7.8*  MG 2.1  --   --   --   --   --  2.2  --   PHOS 6.4*  < > 6.3* 6.6*  --   --  3.7  --   < > = values in this interval not displayed. Liver Function Tests:  Recent Labs  02/18/16 0330  02/25/16 0741 03/12/16 1746 03/13/16 0403  AST 21  --   --  18 19  ALT 16  --   --  <5* <5*  ALKPHOS 60  --   --  68 72  BILITOT 1.0  --   --  0.5 0.9  PROT 7.7  --   --  6.8 7.2  ALBUMIN 2.7*  < > 1.9* 1.7* 1.9*  < > = values in this interval not displayed. No results for input(s): LIPASE, AMYLASE in the last 8760 hours. No results for input(s): AMMONIA in the last 8760 hours. CBC:  Recent Labs  02/21/16 0328 02/22/16 0400  03/12/16 1746 03/13/16 0212 03/13/16 0403 03/14/16 0501  WBC 2.1* 2.3*  < > 4.3 4.0 4.7 3.8*  NEUTROABS 0.9* 1.3*  --  3.0  --   --   --   HGB 10.5* 10.3*  < > 6.1* 8.0* 7.4* 8.4*  HCT 32.3* 32.7*  < > 19.5* 25.3* 23.7* 26.8*  MCV 105.9* 109.4*  < > 108.9* 102.8* 102.2* 100.8*  PLT 184 200  < > 236 209 224 199  < > = values in this interval not displayed. TSH:  Recent Labs  02/18/16 0330 03/13/16 0403  TSH 0.636 1.457   A1C: Lab Results  Component Value Date   HGBA1C 4.9 03/13/2016   Lipid Panel: No results for input(s): CHOL, HDL, LDLCALC, TRIG, CHOLHDL,  LDLDIRECT in the last 8760 hours.   Assessment/Plan 1. Bradycardia HR remains in the upper 50s but overall has improved with DC of beta blocker  2. Essential hypertension Blood pressure variable, overall stable at this time, will cont current regimen   3. ESRD (end stage renal disease) ([place]) conts on dialysis Tuesday, Thursday and Saturday. conts on phoslo and revela  4. Mood disorder ([place]) Stable at this time, becomes defensive during exam easily. Psych following at this time. Will cont current regimen.   5. Gastroesophageal reflux disease without esophagitis Stable on zantac and protonix.   Carlos American. Harle Battiest  [facility] [phone] 8 am - 5 pm) [phone] (after hours)

## 2016-09-25 ENCOUNTER — Encounter: Payer: Self-pay | Admitting: Internal Medicine

## 2016-09-25 ENCOUNTER — Non-Acute Institutional Stay (SKILLED_NURSING_FACILITY): Payer: Medicare Other | Admitting: Internal Medicine

## 2016-09-25 DIAGNOSIS — R112 Nausea with vomiting, unspecified: Secondary | ICD-10-CM

## 2016-09-25 DIAGNOSIS — A09 Infectious gastroenteritis and colitis, unspecified: Secondary | ICD-10-CM | POA: Diagnosis not present

## 2016-09-25 DIAGNOSIS — R197 Diarrhea, unspecified: Secondary | ICD-10-CM

## 2016-09-25 DIAGNOSIS — H00012 Hordeolum externum right lower eyelid: Secondary | ICD-10-CM | POA: Diagnosis not present

## 2016-09-25 NOTE — Patient Instructions (Signed)
See Current Assessment & Plan

## 2016-09-25 NOTE — Progress Notes (Signed)
Facility Location: [facility]  Room Number: 381-O  Code Status: Full Code   This is a nursing facility follow up for specific acute issue of diarrhea  Interim medical record and care since last [place] visit was updated with review of diagnostic studies and change in clinical status since last visit were documented.  HPI: Patient began experiencing diarrhea 12/23. She could not quantitate the number of bowel movements she had but described this as "nonstop" & watery on 1223. She had nausea & vomiting while at dialysis that day. Diarrhea resolved the eve of 12/24 but restarted Christmas day as 4-5 watery bowel movement that day. She received Imodium AD with partial response. She has soreness at the rectum from repeated cleansing. She denies other GI , GU or respiratory symptoms. Apparently there have been at least 4-5 residents on her wing who had diarrhea over the last 72 hours. Unfortunately there has also been at least 2 cases of C. difficile colitis on the same wing in the last month. She is having dental work but denies receiving any maintenance antibiotics from the dentist. She did receive SBE prophylaxis with amoxicillin 2 g on 12/22 pre dental work  Review of systems: She describes "cold in my eyes" with a sty on the right.  Constitutional: No fever,significant weight change, fatigue  Eyes: No discharge, pain, vision change ENT/mouth: No nasal congestion,  purulent discharge, earache,change in hearing ,sore throat  Cardiovascular: No chest pain, palpitations,paroxysmal nocturnal dyspnea, claudication, edema  Respiratory: No cough, sputum production,hemoptysis, DOE , significant snoring,apnea   Gastrointestinal: No heartburn,dysphagia,abdominal pain, rectal bleeding, melena Genitourinary: No dysuria,hematuria, pyuria,  incontinence, nocturia Musculoskeletal: No joint stiffness, joint swelling, weakness,pain Dermatologic: No rash,  pruritus, change in appearance of skin Neurologic: No dizziness,headache,syncope, seizures, numbness , tingling Endocrine: No change in hair/skin/ nails, excessive thirst, excessive hunger, excessive urination  Hematologic/lymphatic: No significant bruising, lymphadenopathy,abnormal bleeding Allergy/immunology: No itchy/ watery eyes, significant sneezing, urticaria, angioedema  Physical exam:  Pertinent or positive findings:There is a sty over the lower right lid. Upper partial. Few remaining teethh are in extremely poor state of hygiene. Caries to the gumline are present. She has a grade 1 systolic murmur. There is dullness to percussion in the right upper quadrant. The left posterior tibial pulses is decreased. Feet are icy cold without ischemic changes. There is hyperpigmentation with icthyoid changes of the left shin. Pes planus is present.   There is no jaundice or significant tenting.   General appearance:Adequately nourished; no acute distress , increased work of breathing is present.   Lymphatic: No lymphadenopathy about the head, neck, axilla . Eyes: No conjunctival inflammation or lid edema is present. There is no scleral icterus. Ears:  External ear exam shows no significant lesions or deformities.   Nose:  External nasal examination shows no deformity or inflammation. Nasal mucosa are pink and moist without lesions ,exudates Oral exam: lips and gums are healthy appearing.There is no oropharyngeal erythema or exudate . Neck:  No thyromegaly, masses, tenderness noted.    Heart:  Normal rate and regular rhythm. S1 and S2 normal without gallop, click, rub .  Lungs:Chest clear to auscultation without wheezes, rhonchi,rales , rubs. Abdomen:Bowel sounds are very active. Abdomen is soft and nontender with no organomegaly, hernias,masses. GU: deferred  Extremities:  No cyanosis, clubbing,edema  Skin: Warm & dry w/o tenting. No significant lesions or rash.   #1 diarrhea, the cluster of  cases on her wing suggest viral gastroenteritis. Unfortunately there  has been at least 2 cases of C. difficile colitis on the same wing in the last month. #2 sty Plan: Symptomatic treatment with C dif study if diarrhea persists. Topical antibiotics for the sty

## 2016-10-03 LAB — CBC AND DIFFERENTIAL
HCT: 33 % — AB (ref 36–46)
Hemoglobin: 10.5 g/dL — AB (ref 12.0–16.0)
NEUTROS ABS: 3 /uL
WBC: 3.9 10^3/mL

## 2016-10-03 LAB — BASIC METABOLIC PANEL: Creatinine: 6.1 mg/dL — AB (ref 0.5–1.1)

## 2016-10-08 ENCOUNTER — Encounter: Payer: Self-pay | Admitting: Internal Medicine

## 2016-10-10 LAB — CBC AND DIFFERENTIAL
HEMATOCRIT: 29 % — AB (ref 36–46)
HEMOGLOBIN: 9.2 g/dL — AB (ref 12.0–16.0)
NEUTROS ABS: 2 /uL
WBC: 3.5 10*3/mL

## 2016-10-10 LAB — BASIC METABOLIC PANEL: CREATININE: 5.3 mg/dL — AB (ref 0.5–1.1)

## 2016-10-22 ENCOUNTER — Other Ambulatory Visit: Payer: Self-pay | Admitting: *Deleted

## 2016-10-22 MED ORDER — LORAZEPAM 0.5 MG PO TABS: ORAL_TABLET | ORAL | 0 refills | Status: DC

## 2016-10-22 NOTE — Telephone Encounter (Signed)
[facility] [phone] Fax: [phone]  

## 2016-11-09 ENCOUNTER — Non-Acute Institutional Stay (SKILLED_NURSING_FACILITY): Payer: Medicare Other | Admitting: Nurse Practitioner

## 2016-11-09 DIAGNOSIS — F39 Unspecified mood [affective] disorder: Secondary | ICD-10-CM

## 2016-11-09 DIAGNOSIS — N186 End stage renal disease: Secondary | ICD-10-CM

## 2016-11-09 DIAGNOSIS — A09 Infectious gastroenteritis and colitis, unspecified: Secondary | ICD-10-CM | POA: Diagnosis not present

## 2016-11-09 DIAGNOSIS — H00014 Hordeolum externum left upper eyelid: Secondary | ICD-10-CM | POA: Diagnosis not present

## 2016-11-09 DIAGNOSIS — I10 Essential (primary) hypertension: Secondary | ICD-10-CM

## 2016-11-09 DIAGNOSIS — R197 Diarrhea, unspecified: Secondary | ICD-10-CM

## 2016-11-09 NOTE — Progress Notes (Signed)
Patient ID: [name], female   DOB: 09-Apr-1941, 76 y.o.   MRN: [id number]    Nursing Home Location:  [facility]   Place of Service: SNF (31)  PCP: Unice Cobble, MD   Code Status: Full Code  No Known Allergies  Chief Complaint  Patient presents with  . Medical Management of Chronic Issues    Routine Visit    HPI:  Patient is a 76 y.o. female seen today at [facility] for routine follow up on chronic conditions. Pt with a hx of  multiple meyloma, ESRD/HD TThSa, HTN, leukopenia, anemia due to neopalstic disease, GERD and severe malnutrition.pt had episodes of diarrhea in the last month. This has now resolved and pt without constipation, diarrhea, nausea or vomiting.  Pt also had stye in right eye, now with stye to left upper lid. Reports staff are applying a cream and she is doing warm compress which has been helping and area much better.  Reports mood has been good, sleeping well at night.  No significant congestion, cough or shortness of breath conts on dialysis on Tuesday, Thursday, saturdays.   Review of Systems:  Review of Systems  Constitutional: Negative for activity change, appetite change, fatigue and unexpected weight change.  HENT: Negative for congestion, hearing loss and sore throat.   Eyes: Negative.   Respiratory: Negative for cough and shortness of breath.   Cardiovascular: Negative for chest pain, palpitations and leg swelling.  Gastrointestinal: Negative for abdominal pain, constipation and diarrhea.  Genitourinary: Negative for dysuria.  Musculoskeletal: Negative for arthralgias and myalgias.  Skin: Negative for color change and wound.  Neurological: Negative for dizziness and weakness.  Psychiatric/Behavioral: Positive for agitation. Negative for behavioral problems and confusion.    Past Medical History:  Diagnosis Date  . Closed fracture of right distal femur ([place]) 09/01/2015  . ESRD (end stage renal disease) ([place]) 03/11/2014  . GERD  (gastroesophageal reflux disease) 04/23/2015  . Hypertension associatd with end stage renal disease on dialysis 03/11/2014  . Malnutrition of moderate degree 09/02/2015  . Multiple myeloma ([place]) 03/11/2014   Past Surgical History:  Procedure Laterality Date  . ABDOMINAL HYSTERECTOMY     Pt. denies  . I&D EXTREMITY Left 02/27/2016   Procedure: ARTHROSCOPIC IRRIGATION AND DEBRIDEMENT EXTREMITY;  Surgeon: Renette Butters, MD;  Location: [place];  Service: Orthopedics;  Laterality: Left;  . TEE WITHOUT CARDIOVERSION N/A 02/29/2016   Procedure: TRANSESOPHAGEAL ECHOCARDIOGRAM (TEE);  Surgeon: Thayer Headings, MD;  Location: [place];  Service: Cardiovascular;  Laterality: N/A;   Social History:   reports that she has never smoked. She has never used smokeless tobacco. She reports that she does not drink alcohol or use drugs.  Family History  Problem Relation Age of Onset  . Hypertension Mother   . Hypertension Father     Medications: Patient's Medications  New Prescriptions   No medications on file  Previous Medications   AMLODIPINE (NORVASC) 10 MG TABLET    Take 10 mg by mouth daily.   BISACODYL (DULCOLAX) 10 MG SUPPOSITORY    Place 1 suppository (10 mg total) rectally daily as needed for moderate constipation.   CALCIUM ACETATE (PHOSLO) 667 MG CAPSULE    Take 1 capsule (667 mg total) by mouth 3 (three) times daily with meals.   CINACALCET (SENSIPAR) 60 MG TABLET    Take 60 mg by mouth daily.   CLONIDINE (CATAPRES) 0.1 MG TABLET    Take 0.1 mg by mouth every 8 (eight) hours as  needed. For B/P over 180.   DEXTROMETHORPHAN HBR PO    Take 20 mg by mouth every 8 (eight) hours as needed.   HYDROCORTISONE CREAM (CORTAID MAXIMUM STRENGTH) 1 %    Apply to rectum 4 times a day for irritation.   ISOSORBIDE MONONITRATE (IMDUR) 60 MG 24 HR TABLET    Take 60 mg by mouth daily.   LAMOTRIGINE (LAMICTAL XR) 50 MG TB24    Take 50 mg by mouth daily. For mood stabilization   LOPERAMIDE HCL (IMODIUM PO)     Give 2 tablets by mouth on dialysis days as needed.   LORAZEPAM (ATIVAN) 0.5 MG TABLET    Take one tablet by mouth at bedtime as needed for anxiety. (control)   MULTIVITAMIN (RENA-VIT) TABS TABLET    Take 1 tablet by mouth daily.   ONDANSETRON (ZOFRAN ODT) 4 MG DISINTEGRATING TABLET    Take 1 tablet (4 mg total) by mouth every 8 (eight) hours as needed for nausea or vomiting.   PANTOPRAZOLE (PROTONIX) 40 MG TABLET    TAKE 1 TABLET BY MOUTH DAILY   PROMETHAZINE (PHENERGAN) 25 MG TABLET    Take 25 mg by mouth every 6 (six) hours as needed for nausea or vomiting.   RANITIDINE (ZANTAC) 300 MG TABLET    Take 300 mg by mouth at bedtime. Reported on 03/05/2016   SERTRALINE (ZOLOFT) 50 MG TABLET    Take 50 mg by mouth daily.   SEVELAMER CARBONATE (RENVELA) 800 MG TABLET    Take 1,600 mg by mouth 3 (three) times daily with meals.    SIMETHICONE (MYLICON) 098 MG CHEWABLE TABLET    Take 1 tablet by mouth prior to each meal.   VALACYCLOVIR (VALTREX) 500 MG TABLET    Take 1 tablet (500 mg total) by mouth every other day.  Modified Medications   No medications on file  Discontinued Medications   No medications on file     Physical Exam: Vitals:   11/09/16 1123  BP: 130/72  Pulse: (!) 54  Resp: 20  Temp: 98.1 F (36.7 C)  SpO2: 100%  Weight: 137 lb 11.2 oz (62.5 kg)  Height: '5\' 7"'  (1.702 m)    Physical Exam  Constitutional:  Frail appearing  HENT:  Nose: Nose normal.  Mouth/Throat: Oropharynx is clear and moist. No oropharyngeal exudate.  Eyes: No scleral icterus.  Neck: Carotid bruit is not present.  Cardiovascular: Normal rate, regular rhythm and normal heart sounds.   Left arm AVF with thrill/audible bruit.  Pulmonary/Chest: Effort normal and breath sounds normal.  Abdominal: Soft. Bowel sounds are normal. There is no hepatomegaly.  Musculoskeletal: She exhibits no edema.  Neurological: She is alert.  AO x 2  Skin: Skin is warm and dry. No rash noted.  Psychiatric: She has a normal  mood and affect.    Labs reviewed: Basic Metabolic Panel:  Recent Labs  02/18/16 0330  02/24/16 1205 02/25/16 0741  03/12/16 1746 03/13/16 0403 03/14/16 0501 10/03/16 10/10/16  NA 128*  < > 128* 125*  < > 123* 121* 127*  --   --   K 5.3*  < > 4.4 4.6  < > 4.7 4.8 3.7  --   --   CL 91*  < > 91* 89*  < > 86* 87* 91*  --   --   CO2 26  < > 26 24  < > '29 27 29  ' --   --   GLUCOSE 112*  < > 92 98  < >  83 78 75  --   --   BUN 44*  < > 44* 66*  < > 28* 32* 13  --   --   CREATININE 7.25*  < > 4.98* 6.38*  < > 5.75* 6.21* 3.48* 6.1* 5.3*  CALCIUM 9.0  < > 9.4 8.9  < > 7.8* 8.0* 7.8*  --   --   MG 2.1  --   --   --   --   --  2.2  --   --   --   PHOS 6.4*  < > 6.3* 6.6*  --   --  3.7  --   --   --   < > = values in this interval not displayed. Liver Function Tests:  Recent Labs  02/18/16 0330  02/25/16 0741 03/12/16 1746 03/13/16 0403  AST 21  --   --  18 19  ALT 16  --   --  <5* <5*  ALKPHOS 60  --   --  68 72  BILITOT 1.0  --   --  0.5 0.9  PROT 7.7  --   --  6.8 7.2  ALBUMIN 2.7*  < > 1.9* 1.7* 1.9*  < > = values in this interval not displayed. No results for input(s): LIPASE, AMYLASE in the last 8760 hours. No results for input(s): AMMONIA in the last 8760 hours. CBC:  Recent Labs  03/12/16 1746 03/13/16 0212 03/13/16 0403 03/14/16 0501 10/03/16 10/10/16  WBC 4.3 4.0 4.7 3.8* 3.9 3.5  NEUTROABS 3.0  --   --   --  3 2  HGB 6.1* 8.0* 7.4* 8.4* 10.5* 9.2*  HCT 19.5* 25.3* 23.7* 26.8* 33* 29*  MCV 108.9* 102.8* 102.2* 100.8*  --   --   PLT 236 209 224 199  --   --    TSH:  Recent Labs  02/18/16 0330 03/13/16 0403  TSH 0.636 1.457   A1C: Lab Results  Component Value Date   HGBA1C 4.9 03/13/2016   Lipid Panel: No results for input(s): CHOL, HDL, LDLCALC, TRIG, CHOLHDL, LDLDIRECT in the last 8760 hours.   Assessment/Plan 1. Essential hypertension Blood pressure stable. Cont on imdur and norvasc   2. ESRD (end stage renal disease) ([place]) Stable, cont on  HD Tuesday, Thursday, sat conts on sensipar, phoslo  3. Mood disorder ([place]) Remains stable, followed by psych in facility, conts on lamictal and zoloft.   4. Hordeolum externum of left upper eyelid Encouraged baby shampoo BID but pt not interested in this. Reports she will cont warm compresses, to use TID   5. Diarrhea of presumed infectious origin Has resolved at this time.    Carlos American. Harle Battiest  [facility] [phone] 8 am - 5 pm) [phone] (after hours)

## 2016-11-22 ENCOUNTER — Other Ambulatory Visit: Payer: Self-pay | Admitting: *Deleted

## 2016-11-22 MED ORDER — LORAZEPAM 0.5 MG PO TABS: ORAL_TABLET | ORAL | 0 refills | Status: DC

## 2016-11-22 NOTE — Telephone Encounter (Signed)
[facility] [phone] Fax: [phone]  

## 2016-12-14 ENCOUNTER — Encounter: Payer: Self-pay | Admitting: Nurse Practitioner

## 2016-12-14 ENCOUNTER — Non-Acute Institutional Stay (SKILLED_NURSING_FACILITY): Payer: Medicare Other | Admitting: Nurse Practitioner

## 2016-12-14 DIAGNOSIS — I10 Essential (primary) hypertension: Secondary | ICD-10-CM | POA: Diagnosis not present

## 2016-12-14 DIAGNOSIS — N186 End stage renal disease: Secondary | ICD-10-CM

## 2016-12-14 DIAGNOSIS — F39 Unspecified mood [affective] disorder: Secondary | ICD-10-CM

## 2016-12-14 DIAGNOSIS — R509 Fever, unspecified: Secondary | ICD-10-CM

## 2016-12-14 NOTE — Progress Notes (Signed)
Patient ID: [name], female   DOB: December 02, 1940, 76 y.o.   MRN: [id number]    Nursing Home Location:  [facility]   Place of Service: SNF (31)  PCP: Unice Cobble, MD   Code Status: Full Code  No Known Allergies  Chief Complaint  Patient presents with  . Medical Management of Chronic Issues    Resident is being seen for routine visit.     HPI:  Patient is a 76 y.o. female seen today at [facility] for routine follow up on chronic conditions. 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 Pt has no new complaints today. No recurrent diarrhea.  She was unable to receive her chemo tx yesterday due to elevated temperature. This is now resolved. She denies fever, chills, or myalgias. Reports her appetite, sleep, bowels, and bladder are doing well. Sleeping and mood have been stable. Continues dialysis T, TH, Sat with no problems. No cough or shortness of breath.   Review of Systems:  Review of Systems  Constitutional: Negative for activity change, appetite change, chills, fatigue, fever and unexpected weight change.  HENT: Negative for congestion, hearing loss and sore throat.   Eyes: Negative.  Negative for pain, redness, itching and visual disturbance.  Respiratory: Negative for cough, chest tightness, shortness of breath and wheezing.   Cardiovascular: Negative for chest pain, palpitations and leg swelling.  Gastrointestinal: Negative for abdominal pain, constipation, diarrhea, nausea and vomiting.  Genitourinary: Negative for dysuria.  Musculoskeletal: Positive for arthralgias. Negative for myalgias.       Right knee related to chronic, old fall injury  Skin: Negative for color change and wound.  Neurological: Negative for dizziness and weakness.  Psychiatric/Behavioral: Positive for agitation. Negative for behavioral problems and confusion.    Past Medical History:  Diagnosis  Date  . Closed fracture of right distal femur ([place]) 09/01/2015  . ESRD (end stage renal disease) ([place]) 03/11/2014  . GERD (gastroesophageal reflux disease) 04/23/2015  . Hypertension associatd with end stage renal disease on dialysis 03/11/2014  . Malnutrition of moderate degree 09/02/2015  . Multiple myeloma ([place]) 03/11/2014   Past Surgical History:  Procedure Laterality Date  . ABDOMINAL HYSTERECTOMY     Pt. denies  . I&D EXTREMITY Left 02/27/2016   Procedure: ARTHROSCOPIC IRRIGATION AND DEBRIDEMENT EXTREMITY;  Surgeon: Renette Butters, MD;  Location: [place];  Service: Orthopedics;  Laterality: Left;  . TEE WITHOUT CARDIOVERSION N/A 02/29/2016   Procedure: TRANSESOPHAGEAL ECHOCARDIOGRAM (TEE);  Surgeon: Thayer Headings, MD;  Location: [place];  Service: Cardiovascular;  Laterality: N/A;   Social History:   reports that she has never smoked. She has never used smokeless tobacco. She reports that she does not drink alcohol or use drugs.  Family History  Problem Relation Age of Onset  . Hypertension Mother   . Hypertension Father     Medications: Patient's Medications  New Prescriptions   No medications on file  Previous Medications   AMLODIPINE (NORVASC) 10 MG TABLET    Take 10 mg by mouth daily.   BISACODYL (DULCOLAX) 10 MG SUPPOSITORY    Place 1 suppository (10 mg total) rectally daily as needed for moderate constipation.   CALCIUM ACETATE (PHOSLO) 667 MG CAPSULE    Take 1 capsule (667 mg total) by mouth 3 (three) times daily with meals.   CINACALCET (SENSIPAR) 60 MG TABLET    Take 60 mg by mouth daily.  CLONIDINE (CATAPRES) 0.1 MG TABLET    Take 0.1 mg by mouth every 8 (eight) hours as needed. For B/P over 180.   DEXTROMETHORPHAN HBR PO    Take 20 mg by mouth every 8 (eight) hours as needed.   HYDROCORTISONE CREAM 1 %    Apply to rectum as needed for irritation.   ISOSORBIDE MONONITRATE (IMDUR) 60 MG 24 HR TABLET    Take 60 mg by mouth daily.   LAMOTRIGINE (LAMICTAL XR) 50 MG  TB24    Take 50 mg by mouth daily. For mood stabilization   LOPERAMIDE HCL (IMODIUM PO)    Give 2 tablets by mouth on dialysis days as needed.   LORAZEPAM (ATIVAN) 0.5 MG TABLET    Take one tablet by mouth at bedtime as needed for anxiety. (control)   MULTIVITAMIN (RENA-VIT) TABS TABLET    Take 1 tablet by mouth daily.   ONDANSETRON (ZOFRAN ODT) 4 MG DISINTEGRATING TABLET    Take 1 tablet (4 mg total) by mouth every 8 (eight) hours as needed for nausea or vomiting.   PANTOPRAZOLE (PROTONIX) 40 MG TABLET    TAKE 1 TABLET BY MOUTH DAILY   PROMETHAZINE (PHENERGAN) 25 MG TABLET    Take 25 mg by mouth every 6 (six) hours as needed for nausea or vomiting.   RANITIDINE (ZANTAC) 300 MG TABLET    Take 300 mg by mouth at bedtime. Reported on 03/05/2016   SERTRALINE (ZOLOFT) 50 MG TABLET    Take 50 mg by mouth daily.   SEVELAMER CARBONATE (RENVELA) 800 MG TABLET    Take 1,600 mg by mouth 3 (three) times daily with meals.    SIMETHICONE (MYLICON) 287 MG CHEWABLE TABLET    Take 1 tablet by mouth prior to each meal.   VALACYCLOVIR (VALTREX) 500 MG TABLET    Take 1 tablet (500 mg total) by mouth every other day.  Modified Medications   No medications on file  Discontinued Medications   HYDROCORTISONE CREAM (CORTAID MAXIMUM STRENGTH) 1 %    Apply to rectum 4 times a day for irritation.     Physical Exam: Vitals:   12/14/16 1116  BP: 140/70  Pulse: 72  Resp: 18  Temp: 98 F (36.7 C)  SpO2: 98%  Weight: 138 lb 12.8 oz (63 kg)  Height: _0  (1.702 m)    Physical Exam  Constitutional: She appears well-developed and well-nourished. No distress.  Guarded, elderly female  HENT:  Head: Normocephalic and atraumatic.  Nose: Nose normal.  Mouth/Throat: Oropharynx is clear and moist. No oropharyngeal exudate.  Eyes: Pupils are equal, round, and reactive to light. Right eye exhibits no discharge. Left eye exhibits no discharge. No scleral icterus.  Neck: No JVD present. Carotid bruit is not present.    Cardiovascular: Normal rate, regular rhythm and normal heart sounds.   Left arm AVF with thrill/audible bruit.  Pulmonary/Chest: Effort normal and breath sounds normal. No respiratory distress. She has no wheezes. She has no rales.  Abdominal: Soft. Bowel sounds are normal. She exhibits no distension. There is no hepatomegaly. There is no tenderness. There is no guarding.  Musculoskeletal: She exhibits no edema or tenderness.  Neurological: She is alert.  AO x 2  Skin: Skin is warm and dry. No rash noted.  Psychiatric: She has a normal mood and affect.    Labs reviewed: Basic Metabolic Panel:  Recent Labs  02/18/16 0330  02/24/16 1205 02/25/16 0741  03/12/16 1746 03/13/16 0403 03/14/16 0501 10/03/16 10/10/16  NA 128*  < > 128* 125*  < > 123* 121* 127*  --   --   K 5.3*  < > 4.4 4.6  < > 4.7 4.8 3.7  --   --   CL 91*  < > 91* 89*  < > 86* 87* 91*  --   --   CO2 26  < > 26 24  < > _0 --   --   GLUCOSE 112*  < > 92 98  < > 83 78 75  --   --   BUN 44*  < > 44* 66*  < > 28* 32* 13  --   --   CREATININE 7.25*  < > 4.98* 6.38*  < > 5.75* 6.21* 3.48* 6.1* 5.3*  CALCIUM 9.0  < > 9.4 8.9  < > 7.8* 8.0* 7.8*  --   --   MG 2.1  --   --   --   --   --  2.2  --   --   --   PHOS 6.4*  < > 6.3* 6.6*  --   --  3.7  --   --   --   < > = values in this interval not displayed. Liver Function Tests:  Recent Labs  02/18/16 0330  02/25/16 0741 03/12/16 1746 03/13/16 0403  AST 21  --   --  18 19  ALT 16  --   --  <5* <5*  ALKPHOS 60  --   --  68 72  BILITOT 1.0  --   --  0.5 0.9  PROT 7.7  --   --  6.8 7.2  ALBUMIN 2.7*  < > 1.9* 1.7* 1.9*  < > = values in this interval not displayed. No results for input(s): LIPASE, AMYLASE in the last 8760 hours. No results for input(s): AMMONIA in the last 8760 hours. CBC:  Recent Labs  03/12/16 1746 03/13/16 0212 03/13/16 0403 03/14/16 0501 10/03/16 10/10/16  WBC 4.3 4.0 4.7 3.8* 3.9 3.5  NEUTROABS 3.0  --   --   --  3 2  HGB 6.1*  8.0* 7.4* 8.4* 10.5* 9.2*  HCT 19.5* 25.3* 23.7* 26.8* 33* 29*  MCV 108.9* 102.8* 102.2* 100.8*  --   --   PLT 236 209 224 199  --   --    TSH:  Recent Labs  02/18/16 0330 03/13/16 0403  TSH 0.636 1.457   A1C: Lab Results  Component Value Date   HGBA1C 4.9 03/13/2016   Lipid Panel: No results for input(s): CHOL, HDL, LDLCALC, TRIG, CHOLHDL, LDLDIRECT in the last 8760 hours.   Assessment/Plan 1. Mood disorder ([place]) -Remains stable, followed by psych in facility. -Continue current regimen including lamictal and zoloft which have been beneficial   2. ESRD (end stage renal disease) ([place]) -Stable, continue on HD T, TH, Sat -Continue on senispar, phoslo  3. Essential hypertension -Stable, BP today 140/70, continue imdur and norvasc, nephrology request blood pressure medication to be hold prior to dialysis due to low blood pressure, changes made.   4. Fever of unknown origin No recurrent fever noted. Will have staff check VS q shift x 72 hours then per protocol, to notify for elevated temperature.  Carlos American. Harle Battiest  [facility] [phone] 8 am - 5 pm) [phone] (after hours)

## 2016-12-24 ENCOUNTER — Other Ambulatory Visit: Payer: Self-pay | Admitting: *Deleted

## 2016-12-24 MED ORDER — LORAZEPAM 0.5 MG PO TABS: ORAL_TABLET | ORAL | 0 refills | Status: DC

## 2016-12-24 NOTE — Telephone Encounter (Signed)
[facility] [phone] Fax: [phone]  

## 2016-12-27 ENCOUNTER — Encounter: Payer: Self-pay | Admitting: Internal Medicine

## 2016-12-27 ENCOUNTER — Non-Acute Institutional Stay (SKILLED_NURSING_FACILITY): Payer: Medicare Other | Admitting: Internal Medicine

## 2016-12-27 DIAGNOSIS — R112 Nausea with vomiting, unspecified: Secondary | ICD-10-CM | POA: Diagnosis not present

## 2016-12-27 DIAGNOSIS — R140 Abdominal distension (gaseous): Secondary | ICD-10-CM

## 2016-12-27 DIAGNOSIS — R195 Other fecal abnormalities: Secondary | ICD-10-CM | POA: Diagnosis not present

## 2016-12-27 DIAGNOSIS — K589 Irritable bowel syndrome without diarrhea: Secondary | ICD-10-CM | POA: Diagnosis not present

## 2016-12-27 NOTE — Progress Notes (Signed)
   This is a nursing facility follow up for specific acute issue of nausea and vomiting and diarrhea  Interim medical record and care since last Stacy visit was updated with review of diagnostic studies and change in clinical status since last visit were documented.  HPI: Patient complains of nausea beginning yesterday around 2pm when she at her lunch.  She vomited once after this event and has had a decreased appetite since.  She was unable to go to dialysis today due to the nausea. She describes associated cramps and gas. She describes the stool as formed with some loose stool. There is no frankly watery stool present. She's had no recent antibiotics. Certainly there's been no travel except to dialysis. She denies any history of bowel disease. She states she believes today's symptoms are related to "nerves". She did not elaborate. She denies other GI, constitutional or GU symptoms.  Review of systems:  Pertinent positives:  Reports lightheadedness that began prior to nausea.  No blurred vision,syncope, or headaches.  Dry skin dryness and itching.  Reports loose stools today and abdominal bloating.  No abdominal cramping.  Chronic cough with white sputum production.   Constitutional: No fever,significant weight change, fatigue  Eyes: No redness, discharge, pain, vision change ENT/mouth: No nasal congestion,  purulent discharge, earache,change in hearing ,sore throat  Cardiovascular: No chest pain, palpitations,paroxysmal nocturnal dyspnea, edema  Respiratory: No DOE , significant snoring,apnea   Gastrointestinal: No heartburn, rectal bleeding, melena. Genitourinary: No dysuria,hematuria, pyuria,  incontinence, nocturia Musculoskeletal: No joint stiffness, joint swelling, weakness,pain Dermatologic: No rash, change in appearance of skin Neurologic: No seizures, numbness , tingling Psychiatric: No significant anxiety, depression, insomnia, anorexia Endocrine: No change  in hair/skin/ nails, excessive thirst, excessive hunger, excessive urination  Hematologic/lymphatic: No significant bruising, lymphadenopathy,abnormal bleeding Allergy/immunology: No itchy/ watery eyes, significant sneezing, urticaria, angioedema  Physical exam:  Pertinent or positive findings: Few lower teeth left.  Grade 2 systolic murmur.  Increased second heart sound Minimal expiratory wheezes in bilateral lung fields  Left arm fistula, positive for bruit and thrill Hyperpigmented, sclerotic changes to the left shin.  Fusiform enlargement of the right knee greater than the left.  Thickened toenails.  General appearance:Adequately nourished; no acute distress , increased work of breathing is present.   Lymphatic: No lymphadenopathy about the head, neck, axilla . Eyes: No conjunctival inflammation or lid edema is present. There is no scleral icterus. Ears:  External ear exam shows no significant lesions or deformities.   Nose:  External nasal examination shows no deformity or inflammation. Nasal mucosa are pink and moist without lesions ,exudates Oral exam: lips and gums are healthy appearing.There is no oropharyngeal erythema or exudate . Neck:  No thyromegaly, masses, tenderness noted.    Heart:  Normal rate and regular rhythm. S1 and S2 normal without gallop, click, rub .  Abdomen:Bowel sounds are normal. Abdomen is soft and nontender with no organomegaly, hernias,masses. GU: deferred  Extremities:  No cyanosis, clubbing,edema  Skin: Warm & dry w/o tenting. No significant lesions or rash.  #1) Nausea & vomiting- clear liquids for 24 hours.  Continue ondansetron as needed.  #2) Loose stools & bloating, probable IBS- Begin Florastor daily for 2 weeks.

## 2016-12-27 NOTE — Patient Instructions (Signed)
See assessment and plan under each diagnosis acutely for this visit  

## 2017-01-02 LAB — BASIC METABOLIC PANEL
Creatinine: 10.2 mg/dL — AB (ref 0.5–1.1)
POTASSIUM: 5.8 mmol/L — AB (ref 3.4–5.3)
SODIUM: 133 mmol/L — AB (ref 137–147)

## 2017-01-02 LAB — CBC AND DIFFERENTIAL
HEMATOCRIT: 36 % (ref 36–46)
Hemoglobin: 11.2 g/dL — AB (ref 12.0–16.0)
WBC: 9.4 10*3/mL

## 2017-01-11 ENCOUNTER — Encounter: Payer: Self-pay | Admitting: Nurse Practitioner

## 2017-01-11 ENCOUNTER — Non-Acute Institutional Stay (SKILLED_NURSING_FACILITY): Payer: Medicare Other | Admitting: Nurse Practitioner

## 2017-01-11 DIAGNOSIS — F39 Unspecified mood [affective] disorder: Secondary | ICD-10-CM | POA: Diagnosis not present

## 2017-01-11 DIAGNOSIS — K589 Irritable bowel syndrome without diarrhea: Secondary | ICD-10-CM | POA: Diagnosis not present

## 2017-01-11 DIAGNOSIS — E43 Unspecified severe protein-calorie malnutrition: Secondary | ICD-10-CM

## 2017-01-11 DIAGNOSIS — I10 Essential (primary) hypertension: Secondary | ICD-10-CM

## 2017-01-11 DIAGNOSIS — N186 End stage renal disease: Secondary | ICD-10-CM | POA: Diagnosis not present

## 2017-01-11 NOTE — Progress Notes (Signed)
Patient ID: [name], female   DOB: [birth year], 76 y.o.   MRN: [id number]    Nursing Home Location:  [facility]  Room: 304  Place of Service: SNF (31)  PCP: Unice Cobble, MD   Code Status: Full Code  No Known Allergies  Chief Complaint  Patient presents with  . Medical Management of Chronic Issues    Resident is being seen for a routine visit.     HPI:  Patient is a 76 y.o. female seen today at [facility] for routine follow up on chronic conditions. 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 Pt reports she was doing fine and there are no new issues. Very short during the visit because she has "somewhere to be." Denies chest pains, shortness of breath or swelling.  Reports good appetite and bowel movements. Reports she cont to urinate while being on HD due to ESRD.  Reports some sneezing and dripping of her nose but denies nasal congestion, sore throat, cough or congestion.  Review of Systems:  Review of Systems  Constitutional: Negative for activity change, appetite change, chills, fatigue, fever and unexpected weight change.  HENT: Positive for sneezing. Negative for congestion, hearing loss and sore throat.   Eyes: Negative.  Negative for pain, redness, itching and visual disturbance.  Respiratory: Negative for cough, chest tightness, shortness of breath and wheezing.   Cardiovascular: Negative for chest pain, palpitations and leg swelling.  Gastrointestinal: Negative for abdominal pain, constipation, diarrhea, nausea and vomiting.  Genitourinary: Negative for dysuria.  Musculoskeletal: Negative for arthralgias and myalgias.  Skin: Negative for color change and wound.  Neurological: Negative for dizziness and weakness.  Psychiatric/Behavioral: Positive for agitation. Negative for behavioral problems and confusion.    Past Medical History:  Diagnosis Date  . Closed  fracture of right distal femur ([place]) 09/01/2015  . ESRD (end stage renal disease) ([place]) 03/11/2014  . GERD (gastroesophageal reflux disease) 04/23/2015  . Hypertension associatd with end stage renal disease on dialysis 03/11/2014  . Malnutrition of moderate degree 09/02/2015  . Multiple myeloma ([place]) 03/11/2014   Past Surgical History:  Procedure Laterality Date  . ABDOMINAL HYSTERECTOMY     Pt. denies  . I&D EXTREMITY Left 02/27/2016   Procedure: ARTHROSCOPIC IRRIGATION AND DEBRIDEMENT EXTREMITY;  Surgeon: Renette Butters, MD;  Location: [place];  Service: Orthopedics;  Laterality: Left;  . TEE WITHOUT CARDIOVERSION N/A 02/29/2016   Procedure: TRANSESOPHAGEAL ECHOCARDIOGRAM (TEE);  Surgeon: Thayer Headings, MD;  Location: [place];  Service: Cardiovascular;  Laterality: N/A;   Social History:   reports that she has never smoked. She has never used smokeless tobacco. She reports that she does not drink alcohol or use drugs.  Family History  Problem Relation Age of Onset  . Hypertension Mother   . Hypertension Father     Medications: Patient's Medications  New Prescriptions   No medications on file  Previous Medications   AMLODIPINE (NORVASC) 10 MG TABLET    Take 10 mg by mouth daily.   BISACODYL (DULCOLAX) 10 MG SUPPOSITORY    Place 1 suppository (10 mg total) rectally daily as needed for moderate constipation.   CALCIUM ACETATE (PHOSLO) 667 MG CAPSULE    Take 1,334 mg by mouth 3 (three) times daily with meals.   CINACALCET (SENSIPAR) 60 MG TABLET    Take 60 mg by mouth daily.   CLONIDINE (CATAPRES) 0.1 MG TABLET  Take 0.1 mg by mouth every 8 (eight) hours as needed. For B/P over 180.   DEXTROMETHORPHAN HBR PO    Take 20 mg by mouth every 8 (eight) hours as needed.   ISOSORBIDE MONONITRATE (IMDUR) 60 MG 24 HR TABLET    Take 60 mg by mouth daily.   LAMOTRIGINE (LAMICTAL) 25 MG TABLET    Take 50 mg by mouth at bedtime. For mood   LOPERAMIDE HCL (IMODIUM PO)    Give 2 tablets by mouth  on dialysis days as needed.   LORAZEPAM (ATIVAN) 0.5 MG TABLET    Take one tablet by mouth at bedtime as needed for anxiety. (control)   MULTIVITAMIN (RENA-VIT) TABS TABLET    Take 1 tablet by mouth daily.   ONDANSETRON (ZOFRAN ODT) 4 MG DISINTEGRATING TABLET    Take 1 tablet (4 mg total) by mouth every 8 (eight) hours as needed for nausea or vomiting.   PANTOPRAZOLE (PROTONIX) 40 MG TABLET    TAKE 1 TABLET BY MOUTH DAILY   PROMETHAZINE (PHENERGAN) 25 MG TABLET    Take 25 mg by mouth every 6 (six) hours as needed for nausea or vomiting.   RANITIDINE (ZANTAC) 300 MG TABLET    Take 300 mg by mouth at bedtime. Reported on 03/05/2016   SERTRALINE (ZOLOFT) 50 MG TABLET    Take 50 mg by mouth daily.   SEVELAMER CARBONATE (RENVELA) 800 MG TABLET    Take 1,600 mg by mouth 3 (three) times daily with meals.    SIMETHICONE (MYLICON) 093 MG CHEWABLE TABLET    Take 1 tablet by mouth prior to each meal.   VALACYCLOVIR (VALTREX) 500 MG TABLET    Take 1 tablet (500 mg total) by mouth every other day.  Modified Medications   No medications on file  Discontinued Medications   CALCIUM ACETATE (PHOSLO) 667 MG CAPSULE    Take 1 capsule (667 mg total) by mouth 3 (three) times daily with meals.   HYDROCORTISONE CREAM 1 %    Apply to rectum as needed for irritation.   LAMOTRIGINE (LAMICTAL XR) 50 MG TB24    Take 50 mg by mouth daily. For mood stabilization     Physical Exam: Vitals:   01/11/17 1028  BP: 112/69  Pulse: 70  Resp: 16  Temp: 97.1 F (36.2 C)  SpO2: 96%  Weight: 139 lb (63 kg)  Height: _0  (1.702 m)    Physical Exam  Constitutional: She appears well-developed and well-nourished. No distress.  Guarded, elderly female  HENT:  Head: Normocephalic and atraumatic.  Nose: Nose normal.  Mouth/Throat: Oropharynx is clear and moist. No oropharyngeal exudate.  Eyes: Pupils are equal, round, and reactive to light. Right eye exhibits no discharge. Left eye exhibits no discharge. No scleral icterus.    Neck: No JVD present. Carotid bruit is not present.  Cardiovascular: Normal rate, regular rhythm and normal heart sounds.   Left arm AVF with thrill/audible bruit.  Pulmonary/Chest: Effort normal and breath sounds normal. No respiratory distress. She has no wheezes. She has no rales.  Abdominal: Soft. Bowel sounds are normal. She exhibits no distension. There is no hepatomegaly. There is no tenderness. There is no guarding.  Musculoskeletal: She exhibits no edema or tenderness.  Neurological: She is alert.  AO x 2  Skin: Skin is warm and dry. No rash noted.  Psychiatric: She has a normal mood and affect.    Labs reviewed: Basic Metabolic Panel:  Recent Labs  02/18/16 0330  02/24/16  1205 02/25/16 0741  03/12/16 1746 03/13/16 0403 03/14/16 0501 10/03/16 10/10/16  NA 128*  < > 128* 125*  < > 123* 121* 127*  --   --   K 5.3*  < > 4.4 4.6  < > 4.7 4.8 3.7  --   --   CL 91*  < > 91* 89*  < > 86* 87* 91*  --   --   CO2 26  < > 26 24  < > _0 --   --   GLUCOSE 112*  < > 92 98  < > 83 78 75  --   --   BUN 44*  < > 44* 66*  < > 28* 32* 13  --   --   CREATININE 7.25*  < > 4.98* 6.38*  < > 5.75* 6.21* 3.48* 6.1* 5.3*  CALCIUM 9.0  < > 9.4 8.9  < > 7.8* 8.0* 7.8*  --   --   MG 2.1  --   --   --   --   --  2.2  --   --   --   PHOS 6.4*  < > 6.3* 6.6*  --   --  3.7  --   --   --   < > = values in this interval not displayed. Liver Function Tests:  Recent Labs  02/18/16 0330  02/25/16 0741 03/12/16 1746 03/13/16 0403  AST 21  --   --  18 19  ALT 16  --   --  <5* <5*  ALKPHOS 60  --   --  68 72  BILITOT 1.0  --   --  0.5 0.9  PROT 7.7  --   --  6.8 7.2  ALBUMIN 2.7*  < > 1.9* 1.7* 1.9*  < > = values in this interval not displayed. No results for input(s): LIPASE, AMYLASE in the last 8760 hours. No results for input(s): AMMONIA in the last 8760 hours. CBC:  Recent Labs  03/12/16 1746 03/13/16 0212 03/13/16 0403 03/14/16 0501 10/03/16 10/10/16  WBC 4.3 4.0 4.7 3.8* 3.9  3.5  NEUTROABS 3.0  --   --   --  3 2  HGB 6.1* 8.0* 7.4* 8.4* 10.5* 9.2*  HCT 19.5* 25.3* 23.7* 26.8* 33* 29*  MCV 108.9* 102.8* 102.2* 100.8*  --   --   PLT 236 209 224 199  --   --    TSH:  Recent Labs  02/18/16 0330 03/13/16 0403  TSH 0.636 1.457   A1C: Lab Results  Component Value Date   HGBA1C 4.9 03/13/2016   Lipid Panel: No results for input(s): CHOL, HDL, LDLCALC, TRIG, CHOLHDL, LDLDIRECT in the last 8760 hours.   Assessment/Plan 1. Irritable bowel syndrome without diarrhea Stable, Not currently having diarrhea. Remains on florastor BID.   2. ESRD (end stage renal disease) ([place]) conts on HD Tuesday, Thursday, Saturday. conts on calcium acetate and renvela. Labs obtained from dialysis, will get record.   3. Mood disorder ([place]) Pt with frequent mood swings, conts to be followed by psych, will cont current regimen   4. Essential hypertension Stable at this time. conts on norvasc. Staff to monitor for hypotension.   5. Protein-calorie malnutrition, severe ([place]) -weight stable, conts on supplement   Esperanza Madrazo K. Harle Battiest  [facility] [phone] 8 am - 5 pm) [phone] (after hours)

## 2017-01-13 LAB — CBC AND DIFFERENTIAL: HEMOGLOBIN: 11.4 g/dL — AB (ref 12.0–16.0)

## 2017-01-13 LAB — BASIC METABOLIC PANEL: BUN: 65 mg/dL — AB (ref 4–21)

## 2017-01-16 ENCOUNTER — Non-Acute Institutional Stay (HOSPITAL_COMMUNITY)
Admission: EM | Admit: 2017-01-16 | Discharge: 2017-01-16 | Disposition: A | Discharge status: Skilled Nursing Facility | Payer: Medicare Other | Attending: Emergency Medicine | Admitting: Emergency Medicine

## 2017-01-16 ENCOUNTER — Emergency Department (HOSPITAL_COMMUNITY): Payer: Medicare Other

## 2017-01-16 DIAGNOSIS — Z79899 Other long term (current) drug therapy: Secondary | ICD-10-CM | POA: Insufficient documentation

## 2017-01-16 DIAGNOSIS — R001 Bradycardia, unspecified: Secondary | ICD-10-CM | POA: Diagnosis not present

## 2017-01-16 DIAGNOSIS — Z8579 Personal history of other malignant neoplasms of lymphoid, hematopoietic and related tissues: Secondary | ICD-10-CM | POA: Insufficient documentation

## 2017-01-16 DIAGNOSIS — Z992 Dependence on renal dialysis: Secondary | ICD-10-CM | POA: Insufficient documentation

## 2017-01-16 DIAGNOSIS — Z9221 Personal history of antineoplastic chemotherapy: Secondary | ICD-10-CM | POA: Diagnosis not present

## 2017-01-16 DIAGNOSIS — E875 Hyperkalemia: Secondary | ICD-10-CM | POA: Insufficient documentation

## 2017-01-16 DIAGNOSIS — I1310 Hypertensive heart and chronic kidney disease without heart failure, with stage 1 through stage 4 chronic kidney disease, or unspecified chronic kidney disease: Secondary | ICD-10-CM | POA: Insufficient documentation

## 2017-01-16 DIAGNOSIS — K219 Gastro-esophageal reflux disease without esophagitis: Secondary | ICD-10-CM | POA: Insufficient documentation

## 2017-01-16 DIAGNOSIS — N186 End stage renal disease: Secondary | ICD-10-CM | POA: Diagnosis not present

## 2017-01-16 LAB — CBC WITH DIFFERENTIAL/PLATELET
BASOS PCT: 0 %
Basophils Absolute: 0 10*3/uL (ref 0.0–0.1)
EOS PCT: 12 %
Eosinophils Absolute: 0.4 10*3/uL (ref 0.0–0.7)
HCT: 36.3 % (ref 36.0–46.0)
Hemoglobin: 11.5 g/dL — ABNORMAL LOW (ref 12.0–15.0)
Lymphocytes Relative: 27 %
Lymphs Abs: 0.9 10*3/uL (ref 0.7–4.0)
MCH: 34 pg (ref 26.0–34.0)
MCHC: 31.7 g/dL (ref 30.0–36.0)
MCV: 107.4 fL — AB (ref 78.0–100.0)
MONO ABS: 0.3 10*3/uL (ref 0.1–1.0)
MONOS PCT: 10 %
NEUTROS ABS: 1.8 10*3/uL (ref 1.7–7.7)
Neutrophils Relative %: 51 %
Platelets: 140 10*3/uL — ABNORMAL LOW (ref 150–400)
RBC: 3.38 MIL/uL — ABNORMAL LOW (ref 3.87–5.11)
RDW: 15.8 % — AB (ref 11.5–15.5)
WBC: 3.5 10*3/uL — ABNORMAL LOW (ref 4.0–10.5)

## 2017-01-16 LAB — BASIC METABOLIC PANEL
ANION GAP: 12 (ref 5–15)
BUN: 67 mg/dL — AB (ref 6–20)
CALCIUM: 8.8 mg/dL — AB (ref 8.9–10.3)
CO2: 25 mmol/L (ref 22–32)
CREATININE: 10.08 mg/dL — AB (ref 0.44–1.00)
Chloride: 98 mmol/L — ABNORMAL LOW (ref 101–111)
GFR calc Af Amer: 4 mL/min — ABNORMAL LOW (ref >60)
GFR, EST NON AFRICAN AMERICAN: 3 mL/min — AB (ref >60)
GLUCOSE: 75 mg/dL (ref 65–99)
Potassium: 6.7 mmol/L (ref 3.5–5.1)
Sodium: 135 mmol/L (ref 135–145)

## 2017-01-16 MED ORDER — LIDOCAINE-PRILOCAINE 2.5-2.5 % EX CREA: 1.0000 "application " | TOPICAL_CREAM | CUTANEOUS | Status: DC | PRN

## 2017-01-16 MED ORDER — ALTEPLASE 2 MG IJ SOLR: 2.0000 mg | Freq: Once | INTRAMUSCULAR | Status: DC | PRN

## 2017-01-16 MED ORDER — SODIUM CHLORIDE 0.9 % IV SOLN: 100.0000 mL | INTRAVENOUS | Status: DC | PRN

## 2017-01-16 MED ORDER — PENTAFLUOROPROP-TETRAFLUOROETH EX AERO: 1.0000 "application " | INHALATION_SPRAY | CUTANEOUS | Status: DC | PRN

## 2017-01-16 MED ORDER — HEPARIN SODIUM (PORCINE) 1000 UNIT/ML DIALYSIS: 1000.0000 [IU] | INTRAMUSCULAR | Status: DC | PRN

## 2017-01-16 MED ORDER — LIDOCAINE HCL (PF) 1 % IJ SOLN: 5.0000 mL | INTRAMUSCULAR | Status: DC | PRN

## 2017-01-16 MED ORDER — SODIUM POLYSTYRENE SULFONATE 15 GM/60ML PO SUSP: 30.0000 g | Freq: Once | ORAL | Status: DC

## 2017-01-16 NOTE — Progress Notes (Signed)
Patient was seen on dialysis and the procedure was supervised.  BFR 400  Via AVF BP is  124/80.   Patient appears to be tolerating treatment well.  Patient missed her regular OP dialysis treatment yesterday because she was feeling badly.  She came to ER today and found to have an elevated potassium.  Plan is to run 2 hours on 1 K bath here this evening, then she can go back to NH and knows to go to her OP unit for HD tomorrow  Leonell Lobdell A 01/16/2017

## 2017-01-16 NOTE — ED Triage Notes (Signed)
Patient comes in per [facility] for dialysis and hyperkalemia. Fm [facility]. TTS dialysis. Last tx Sat. K+ 6.5. LA fistula. EMS 150/90, 68 HR, 18 RR, 95% RA. ekg unremarkable. No complaints.

## 2017-01-16 NOTE — ED Provider Notes (Signed)
Patient was called by her physician today notifying her that she had waited serum potassium and that she needed to come to the emergency department. Patient missed dialysis session yesterday as she states she didn't feel well or with "stomach was bothering me" she feels normal today.   Orlie Dakin, MD 01/16/17 1901

## 2017-01-16 NOTE — ED Provider Notes (Signed)
[facility] DEPT Provider Note   CSN: [id number] Arrival date & time: 01/16/17  1550     History   Chief Complaint Chief Complaint  Patient presents with  . Abnormal Lab    HPI [name] is a 76 y.o. female.  Patient presents to the ED with a chief complaint of hyperkalemia.  She states that she was seeing a specialist at [facility] today and was told that her K was 6.5 and was advised that she come to the ED.  She states that she missed dialysis yesterday because of cold symptoms.  She states that she feels improved today.  She denies any symptoms now.  Denies CP, SOB, or abdominal pain.  She denies any other associated symptoms.  There are no modifying factors.   The history is provided by the patient. No language interpreter was used.    Past Medical History:  Diagnosis Date  . Closed fracture of right distal femur ([place]) 09/01/2015  . ESRD (end stage renal disease) ([place]) 03/11/2014  . GERD (gastroesophageal reflux disease) 04/23/2015  . Hypertension associatd with end stage renal disease on dialysis 03/11/2014  . Malnutrition of moderate degree 09/02/2015  . Multiple myeloma ([place]) 03/11/2014    Patient Active Problem List   Diagnosis Date Noted  . IBS (irritable bowel syndrome) 12/27/2016  . Mood disorder ([place]) 07/19/2016  . Diastolic dysfunction 60/63/0160  . Insomnia 04/27/2016  . Hyponatremia 03/12/2016  . Symptomatic anemia 03/12/2016  . Diarrhea 03/12/2016  . Essential hypertension   . Swelling of joint of left knee 02/18/2016  . Leukopenia due to antineoplastic chemotherapy ([place]) 09/03/2015  . Malnutrition of moderate degree 09/02/2015  . GERD (gastroesophageal reflux disease) 04/23/2015  . 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 ESRD (end stage renal disease) ([place]) 03/11/2014  . Multiple myeloma ([place]) 03/11/2014  . Hypertension associatd with end stage renal disease on dialysis 03/11/2014    Past Surgical History:  Procedure Laterality Date  . ABDOMINAL  HYSTERECTOMY     Pt. denies  . I&D EXTREMITY Left 02/27/2016   Procedure: ARTHROSCOPIC IRRIGATION AND DEBRIDEMENT EXTREMITY;  Surgeon: Renette Butters, MD;  Location: [facility];  Service: Orthopedics;  Laterality: Left;  . TEE WITHOUT CARDIOVERSION N/A 02/29/2016   Procedure: TRANSESOPHAGEAL ECHOCARDIOGRAM (TEE);  Surgeon: Thayer Headings, MD;  Location: [facility];  Service: Cardiovascular;  Laterality: N/A;    OB History    No data available       Home Medications    Prior to Admission medications   Medication Sig Start Date End Date Taking? Authorizing Provider  amLODipine (NORVASC) 10 MG tablet Take 10 mg by mouth daily.    Historical Provider, MD  bisacodyl (DULCOLAX) 10 MG suppository Place 1 suppository (10 mg total) rectally daily as needed for moderate constipation. 09/07/15   Theodis Blaze, MD  calcium acetate (PHOSLO) 667 MG capsule Take 1,334 mg by mouth 3 (three) times daily with meals.    Historical Provider, MD  cinacalcet (SENSIPAR) 60 MG tablet Take 60 mg by mouth daily.    Historical Provider, MD  cloNIDine (CATAPRES) 0.1 MG tablet Take 0.1 mg by mouth every 8 (eight) hours as needed. For B/P over 180.    Historical Provider, MD  DEXTROMETHORPHAN HBR PO Take 20 mg by mouth every 8 (eight) hours as needed.    Historical Provider, MD  isosorbide mononitrate (IMDUR) 60 MG 24 hr tablet Take 60 mg by mouth daily. 03/24/15   Historical Provider, MD  lamoTRIgine (LAMICTAL) 25  MG tablet Take 50 mg by mouth at bedtime. For mood    Historical Provider, MD  Loperamide HCl (IMODIUM PO) Give 2 tablets by mouth on dialysis days as needed.    Historical Provider, MD  LORazepam (ATIVAN) 0.5 MG tablet Take one tablet by mouth at bedtime as needed for anxiety. (control) 12/24/16   Lauree Chandler, NP  multivitamin (RENA-VIT) TABS tablet Take 1 tablet by mouth daily.    Historical Provider, MD  ondansetron (ZOFRAN ODT) 4 MG disintegrating tablet Take 1 tablet (4 mg total) by mouth every 8  (eight) hours as needed for nausea or vomiting. 01/21/15   Everlene Balls, MD  pantoprazole (PROTONIX) 40 MG tablet TAKE 1 TABLET BY MOUTH DAILY 09/06/15   Boykin Nearing, MD  promethazine (PHENERGAN) 25 MG tablet Take 25 mg by mouth every 6 (six) hours as needed for nausea or vomiting.    Historical Provider, MD  ranitidine (ZANTAC) 300 MG tablet Take 300 mg by mouth at bedtime. Reported on 03/05/2016    Historical Provider, MD  sertraline (ZOLOFT) 50 MG tablet Take 50 mg by mouth daily.    Historical Provider, MD  sevelamer carbonate (RENVELA) 800 MG tablet Take 1,600 mg by mouth 3 (three) times daily with meals.     Historical Provider, MD  simethicone (MYLICON) 332 MG chewable tablet Take 1 tablet by mouth prior to each meal.    Historical Provider, MD  valACYclovir (VALTREX) 500 MG tablet Take 1 tablet (500 mg total) by mouth every other day. 03/01/16   Modena Jansky, MD    Family History Family History  Problem Relation Age of Onset  . Hypertension Mother   . Hypertension Father     Social History Social History  Substance Use Topics  . Smoking status: Never Smoker  . Smokeless tobacco: Never Used  . Alcohol use No     Allergies   Patient has no known allergies.   Review of Systems Review of Systems  All other systems reviewed and are negative.    Physical Exam Updated Vital Signs BP 132/81   Pulse (!) 49   Temp 98.9 F (37.2 C) (Oral)   Resp 18   Ht '5\' 7"'  (1.702 m)   Wt 62.5 kg   SpO2 94%   BMI 21.58 kg/m   Physical Exam  Constitutional: She is oriented to person, place, and time. She appears well-developed and well-nourished.  HENT:  Head: Normocephalic and atraumatic.  Eyes: Conjunctivae and EOM are normal. Pupils are equal, round, and reactive to light.  Neck: Normal range of motion. Neck supple.  Cardiovascular: Normal rate and regular rhythm.  Exam reveals no gallop and no friction rub.   No murmur heard. Pulmonary/Chest: Effort normal and breath sounds  normal. No respiratory distress. She has no wheezes. She has no rales. She exhibits no tenderness.  Abdominal: Soft. Bowel sounds are normal. She exhibits no distension and no mass. There is no tenderness. There is no rebound and no guarding.  Musculoskeletal: Normal range of motion. She exhibits no edema or tenderness.  Neurological: She is alert and oriented to person, place, and time.  Skin: Skin is warm and dry.  Psychiatric: She has a normal mood and affect. Her behavior is normal. Judgment and thought content normal.  Nursing note and vitals reviewed.    ED Treatments / Results  Labs (all labs ordered are listed, but only abnormal results are displayed) Labs Reviewed  CBC WITH DIFFERENTIAL/PLATELET  BASIC METABOLIC PANEL  EKG  EKG Interpretation  Date/Time:  Wednesday January 16 2017 16:54:17 EDT Ventricular Rate:  49 PR Interval:    QRS Duration: 99 QT Interval:  506 QTC Calculation: 457 R Axis:   -5 Text Interpretation:  Sinus bradycardia Low voltage, precordial leads Baseline wander in lead(s) V2 Since last tracing rate slower Confirmed by Winfred Leeds  MD, SAM [phone]) on 01/16/2017 5:00:26 PM       Radiology Dg Chest 2 View  Result Date: 01/16/2017 CLINICAL DATA:  Hyperkalemia. The patient missed dialysis yesterday. EXAM: CHEST  2 VIEW COMPARISON:  PA and lateral chest 02/18/2016. FINDINGS: There is cardiomegaly without edema. Lungs are clear. No pneumothorax or pleural effusion. No acute bony abnormality. IMPRESSION: Cardiomegaly without acute disease. Electronically Signed   By: Inge Rise M.D.   On: 01/16/2017 17:12    Procedures Procedures (including critical care time)  Medications Ordered in ED Medications - No data to display   Initial Impression / Assessment and Plan / ED Course  I have reviewed the triage vital signs and the nursing notes.  Pertinent labs & imaging results that were available during my care of the patient were reviewed by me and  considered in my medical decision making (see chart for details).     Patient sent to ED for hyperkalemia.  No CP, SOB.  No hyperacute t waves or wide complexes, mildy bradycardic.  Labs are pending.    Potassium is 6.7. I discussed patient with Dr. Moshe Cipro, who will dialyze patient tonight.    Final Clinical Impressions(s) / ED Diagnoses   Final diagnoses:  Hyperkalemia    New Prescriptions New Prescriptions   No medications on file     Montine Circle, PA-C 01/17/17 [place], MD 01/17/17 [phone]

## 2017-01-17 LAB — HEPATITIS B SURFACE ANTIGEN: Hepatitis B Surface Ag: NEGATIVE

## 2017-01-29 ENCOUNTER — Other Ambulatory Visit: Payer: Self-pay

## 2017-01-29 MED ORDER — LORAZEPAM 0.5 MG PO TABS: 0.5000 mg | ORAL_TABLET | Freq: Every day | ORAL | 0 refills | Status: DC

## 2017-01-29 NOTE — Telephone Encounter (Signed)
Prescription request was received from:    [facility] [address]  [place] [address]  Phone: [phone] Fax: [phone] 

## 2017-03-05 ENCOUNTER — Other Ambulatory Visit: Payer: Self-pay | Admitting: *Deleted

## 2017-03-05 MED ORDER — LORAZEPAM 0.5 MG PO TABS: 0.5000 mg | ORAL_TABLET | Freq: Every day | ORAL | 0 refills | Status: DC

## 2017-03-05 NOTE — Telephone Encounter (Signed)
[facility] [phone] Fax: [phone]  

## 2017-03-08 ENCOUNTER — Encounter: Payer: Self-pay | Admitting: Nurse Practitioner

## 2017-03-08 NOTE — Progress Notes (Deleted)
Patient ID: [name], female   DOB: December 07, 1940, 76 y.o.   MRN: [id number]    Nursing Home Location:  [facility]  Room: [place]: SNF (31)  PCP: Hendricks Limes, MD   Code Status: Full Code  No Known Allergies  Chief Complaint  Patient presents with  . Medical Management of Chronic Issues    Resident is being seen for a routine visit.     HPI:  Patient is a 76 y.o. female seen today at [facility] for routine follow up on chronic conditions. 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 ***  Review of Systems:  Review of Systems  Constitutional: Negative for activity change, appetite change, chills, fatigue, fever and unexpected weight change.  HENT: Positive for sneezing. Negative for congestion, hearing loss and sore throat.   Eyes: Negative.  Negative for pain, redness, itching and visual disturbance.  Respiratory: Negative for cough, chest tightness, shortness of breath and wheezing.   Cardiovascular: Negative for chest pain, palpitations and leg swelling.  Gastrointestinal: Negative for abdominal pain, constipation, diarrhea, nausea and vomiting.  Genitourinary: Negative for dysuria.  Musculoskeletal: Negative for arthralgias and myalgias.  Skin: Negative for color change and wound.  Neurological: Negative for dizziness and weakness.  Psychiatric/Behavioral: Positive for agitation. Negative for behavioral problems and confusion.    Past Medical History:  Diagnosis Date  . Closed fracture of right distal femur ([place]) 09/01/2015  . ESRD (end stage renal disease) ([place]) 03/11/2014  . GERD (gastroesophageal reflux disease) 04/23/2015  . Hypertension associatd with end stage renal disease on dialysis 03/11/2014  . Malnutrition of moderate degree 09/02/2015  . Multiple myeloma ([place]) 03/11/2014   Past Surgical History:  Procedure Laterality Date  . ABDOMINAL  HYSTERECTOMY     Pt. denies  . I&D EXTREMITY Left 02/27/2016   Procedure: ARTHROSCOPIC IRRIGATION AND DEBRIDEMENT EXTREMITY;  Surgeon: Renette Butters, MD;  Location: [facility];  Service: Orthopedics;  Laterality: Left;  . TEE WITHOUT CARDIOVERSION N/A 02/29/2016   Procedure: TRANSESOPHAGEAL ECHOCARDIOGRAM (TEE);  Surgeon: Thayer Headings, MD;  Location: [facility];  Service: Cardiovascular;  Laterality: N/A;   Social History:   reports that she has never smoked. She has never used smokeless tobacco. She reports that she does not drink alcohol or use drugs.  Family History  Problem Relation Age of Onset  . Hypertension Mother   . Hypertension Father     Medications: Patient's Medications  New Prescriptions   No medications on file  Previous Medications   AMLODIPINE (NORVASC) 10 MG TABLET    Take 10 mg by mouth daily. HOLD UNTIL AFTER DIALYSIS ON TUES/THURS/SAT AND HOLD FOR SYSTOLIC B/P READING OF 453 OR LESS   BISACODYL (BISAC-EVAC) 10 MG SUPPOSITORY    Place 10 mg rectally daily as needed for moderate constipation.   BISACODYL (DULCOLAX) 10 MG SUPPOSITORY    Place 1 suppository (10 mg total) rectally daily as needed for moderate constipation.   CALCIUM ACETATE (PHOSLO) 667 MG CAPSULE    Take 1,334 mg by mouth 3 (three) times daily.    CINACALCET (SENSIPAR) 60 MG TABLET    Take 60 mg by mouth daily.   CLONIDINE (CATAPRES) 0.1 MG TABLET    Take 0.1 mg by mouth every 8 (eight) hours as needed (for a systolic B/P of 646 or greater).    DEXTROMETHORPHAN HBR PO    Take 20 mg  by mouth every 8 (eight) hours as needed (for cough).    HYDROCORTISONE CREAM 1 %    Apply 1 application topically See admin instructions. APPLY TO RECTUM AS NEEDED FOR HEMORRHOIDS - q8h prn   ISOSORBIDE MONONITRATE (IMDUR) 60 MG 24 HR TABLET    Take 60 mg by mouth daily.    LAMOTRIGINE (LAMICTAL) 25 MG TABLET    Take 50 mg by mouth at bedtime. Take 2 tablets to = 50 mg   LOPERAMIDE (IMODIUM A-D) 2 MG TABLET    Take 2 mg  by mouth See admin instructions. AFTER EACH LOOSE STOOL/NOT TO EXCEED 6 TABLETS IN 24 HOURS FOR 2 DAYS (MAY ALSO TAKE 4 MG AS NEEDED PRE-DIALYSIS)   LORAZEPAM (ATIVAN) 0.5 MG TABLET    Take 1 tablet (0.5 mg total) by mouth at bedtime.   MULTIVITAMIN (RENA-VIT) TABS TABLET    Take 1 tablet by mouth daily.   NUTRITIONAL SUPPLEMENTS (NEPRO) LIQD    Take 270 mLs by mouth 2 (two) times daily.   ONDANSETRON (ZOFRAN ODT) 4 MG DISINTEGRATING TABLET    Take 1 tablet (4 mg total) by mouth every 8 (eight) hours as needed for nausea or vomiting.   PANTOPRAZOLE (PROTONIX) 40 MG TABLET    TAKE 1 TABLET BY MOUTH DAILY   PROMETHAZINE (PHENERGAN) 25 MG TABLET    Take 25 mg by mouth every 6 (six) hours as needed for nausea or vomiting.   RANITIDINE (ZANTAC) 300 MG TABLET    Take 300 mg by mouth at bedtime. Reported on 03/05/2016   SERTRALINE (ZOLOFT) 50 MG TABLET    Take 50 mg by mouth daily.    SEVELAMER CARBONATE (RENVELA) 800 MG TABLET    Take 1,600 mg by mouth 3 (three) times daily with meals.    SIMETHICONE (MYLICON) 500 MG CHEWABLE TABLET    Take 1 tablet by mouth prior to each meal.   SODIUM PHOSPHATES (RA SALINE ENEMA) 19-7 GM/118ML ENEM    Place 1 enema rectally once as needed (if constipation not relieved by Dulcolax suppository). CALL DOCTOR IF NO RELIEF FROM THIS   VALACYCLOVIR (VALTREX) 500 MG TABLET    Take 1 tablet (500 mg total) by mouth every other day.  Modified Medications   No medications on file  Discontinued Medications   No medications on file     Physical Exam: Vitals:   03/08/17 0946  BP: 140/74  Pulse: 84  Resp: 19  Temp: 98.1 F (36.7 C)  SpO2: 96%  Weight: 141 lb 14.4 oz (64.4 kg)  Height: '5\' 7"'  (1.702 m)    Physical Exam  Constitutional: She appears well-developed and well-nourished. No distress.  Guarded, elderly female  HENT:  Head: Normocephalic and atraumatic.  Nose: Nose normal.  Mouth/Throat: Oropharynx is clear and moist. No oropharyngeal exudate.  Eyes: Pupils  are equal, round, and reactive to light. Right eye exhibits no discharge. Left eye exhibits no discharge. No scleral icterus.  Neck: No JVD present. Carotid bruit is not present.  Cardiovascular: Normal rate, regular rhythm and normal heart sounds.   Left arm AVF with thrill/audible bruit.  Pulmonary/Chest: Effort normal and breath sounds normal. No respiratory distress. She has no wheezes. She has no rales.  Abdominal: Soft. Bowel sounds are normal. She exhibits no distension. There is no hepatomegaly. There is no tenderness. There is no guarding.  Musculoskeletal: She exhibits no edema or tenderness.  Neurological: She is alert.  AO x 2  Skin: Skin is warm and dry.  No rash noted.  Psychiatric: She has a normal mood and affect.    Labs reviewed: Basic Metabolic Panel:  Recent Labs  03/13/16 0403 03/14/16 0501  10/10/16 01/02/17 01/13/17 01/16/17 1725  NA 121* 127*  --   --  133*  --  135  K 4.8 3.7  --   --  5.8*  --  6.7*  CL 87* 91*  --   --   --   --  98*  CO2 27 29  --   --   --   --  25  GLUCOSE 78 75  --   --   --   --  75  BUN 32* 13  --   --   --  65* 67*  CREATININE 6.21* 3.48*  < > 5.3* 10.2*  --  10.08*  CALCIUM 8.0* 7.8*  --   --   --   --  8.8*  MG 2.2  --   --   --   --   --   --   PHOS 3.7  --   --   --   --   --   --   < > = values in this interval not displayed. Liver Function Tests:  Recent Labs  03/12/16 1746 03/13/16 0403  AST 18 19  ALT <5* <5*  ALKPHOS 68 72  BILITOT 0.5 0.9  PROT 6.8 7.2  ALBUMIN 1.7* 1.9*   No results for input(s): LIPASE, AMYLASE in the last 8760 hours. No results for input(s): AMMONIA in the last 8760 hours. CBC:  Recent Labs  03/13/16 0403 03/14/16 0501  10/03/16 10/10/16 01/02/17 01/13/17 01/16/17 1725  WBC 4.7 3.8*  < > 3.9 3.5 9.4  --  3.5*  NEUTROABS  --   --   --  3 2  --   --  1.8  HGB 7.4* 8.4*  --  10.5* 9.2* 11.2* 11.4* 11.5*  HCT 23.7* 26.8*  --  33* 29* 36  --  36.3  MCV 102.2* 100.8*  --   --   --   --    --  107.4*  PLT 224 199  --   --   --   --   --  140*  < > = values in this interval not displayed. TSH:  Recent Labs  03/13/16 0403  TSH 1.457   A1C: Lab Results  Component Value Date   HGBA1C 4.9 03/13/2016   Lipid Panel: No results for input(s): CHOL, HDL, LDLCALC, TRIG, CHOLHDL, LDLDIRECT in the last 8760 hours.   Assessment/Plan ***  Jessica K. Harle Battiest  [facility] [phone] 8 am - 5 pm) [phone] (after hours)

## 2017-03-08 NOTE — Progress Notes (Signed)
This encounter was created in error - please disregard.

## 2017-03-13 ENCOUNTER — Non-Acute Institutional Stay (SKILLED_NURSING_FACILITY): Payer: Medicare Other | Admitting: Nurse Practitioner

## 2017-03-13 ENCOUNTER — Encounter: Payer: Self-pay | Admitting: Nurse Practitioner

## 2017-03-13 DIAGNOSIS — D630 Anemia in neoplastic disease: Secondary | ICD-10-CM

## 2017-03-13 DIAGNOSIS — I10 Essential (primary) hypertension: Secondary | ICD-10-CM | POA: Diagnosis not present

## 2017-03-13 DIAGNOSIS — K219 Gastro-esophageal reflux disease without esophagitis: Secondary | ICD-10-CM | POA: Diagnosis not present

## 2017-03-13 DIAGNOSIS — N186 End stage renal disease: Secondary | ICD-10-CM | POA: Diagnosis not present

## 2017-03-13 DIAGNOSIS — F39 Unspecified mood [affective] disorder: Secondary | ICD-10-CM | POA: Diagnosis not present

## 2017-03-13 NOTE — Progress Notes (Signed)
Patient ID: [name], female   DOB: [birth year], 76 y.o.   MRN: [id number]    Nursing Home Location:  [facility]  Room: [place]: SNF (31)  PCP: Hendricks Limes, MD   Code Status: Full Code  No Known Allergies  Chief Complaint  Patient presents with  . Medical Management of Chronic Issues    Routine    HPI:  Patient is a 76 y.o. female seen today at [facility] for routine follow up on chronic conditions. 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  Going to [facility] today for chemotherapy due to multiple myeloma. Denies any problems and was very short. Reports she did not have time for a visit but then answered questions with short answers and states she is fine. Had nasal congestion but this has resolved. Pt is out of facility for dialysis on Tuesday, Thursday and Saturday.  Denies constipation.  Followed by psych due to agitation and mood disorder- suspecting bipolar - treating with Lamictal, Zoloft and Ativan Review of Systems: provided by staff and pt.  Review of Systems  Constitutional: Negative for activity change, appetite change, chills, fatigue, fever and unexpected weight change.  HENT: Negative for congestion, hearing loss and sore throat.   Eyes: Negative.  Negative for pain, redness, itching and visual disturbance.  Respiratory: Negative for cough, chest tightness, shortness of breath and wheezing.   Cardiovascular: Negative for chest pain, palpitations and leg swelling.  Gastrointestinal: Negative for abdominal pain, constipation, diarrhea, nausea and vomiting.  Genitourinary: Negative for dysuria.  Musculoskeletal: Negative for arthralgias and myalgias.  Skin: Negative for color change and wound.  Neurological: Negative for dizziness and weakness.  Psychiatric/Behavioral: Positive for agitation. Negative for behavioral problems and confusion.     Past Medical History:  Diagnosis Date  . Closed fracture of right distal femur ([place]) 09/01/2015  . ESRD (end stage renal disease) ([place]) 03/11/2014  . GERD (gastroesophageal reflux disease) 04/23/2015  . Hypertension associatd with end stage renal disease on dialysis 03/11/2014  . Malnutrition of moderate degree 09/02/2015  . Multiple myeloma ([place]) 03/11/2014   Past Surgical History:  Procedure Laterality Date  . ABDOMINAL HYSTERECTOMY     Pt. denies  . I&D EXTREMITY Left 02/27/2016   Procedure: ARTHROSCOPIC IRRIGATION AND DEBRIDEMENT EXTREMITY;  Surgeon: Renette Butters, MD;  Location: [place];  Service: Orthopedics;  Laterality: Left;  . TEE WITHOUT CARDIOVERSION N/A 02/29/2016   Procedure: TRANSESOPHAGEAL ECHOCARDIOGRAM (TEE);  Surgeon: Thayer Headings, MD;  Location: [place];  Service: Cardiovascular;  Laterality: N/A;   Social History:   reports that she has never smoked. She has never used smokeless tobacco. She reports that she does not drink alcohol or use drugs.  Family History  Problem Relation Age of Onset  . Hypertension Mother   . Hypertension Father     Medications: Patient's Medications  New Prescriptions   No medications on file  Previous Medications   AMLODIPINE (NORVASC) 10 MG TABLET    Take 10 mg by mouth daily. HOLD UNTIL AFTER DIALYSIS ON TUES/THURS/SAT AND HOLD FOR SYSTOLIC B/P READING OF 694 OR LESS   BISACODYL (BISAC-EVAC) 10 MG SUPPOSITORY    Place 10 mg rectally daily as needed for moderate constipation.   BISACODYL (DULCOLAX) 10 MG SUPPOSITORY    Place 1 suppository (10 mg total) rectally daily as needed for moderate constipation.   CALCIUM ACETATE (PHOSLO)  667 MG CAPSULE    Take 1,334 mg by mouth 3 (three) times daily.    CINACALCET (SENSIPAR) 60 MG TABLET    Take 60 mg by mouth daily.   CLONIDINE (CATAPRES) 0.1 MG TABLET    Take 0.1 mg by mouth every 8 (eight) hours as needed (for a systolic B/P of 914 or greater).    DEXTROMETHORPHAN HBR PO    Take  20 mg by mouth every 8 (eight) hours as needed (for cough).    HYDROCORTISONE CREAM 1 %    Apply 1 application topically See admin instructions. APPLY TO RECTUM AS NEEDED FOR HEMORRHOIDS - q8h prn   ISOSORBIDE MONONITRATE (IMDUR) 60 MG 24 HR TABLET    Take 60 mg by mouth daily.    LAMOTRIGINE (LAMICTAL) 25 MG TABLET    Take 50 mg by mouth daily. Take 2 tablets to = 50 mg   LOPERAMIDE (IMODIUM A-D) 2 MG TABLET    Take 2 mg by mouth See admin instructions. AFTER EACH LOOSE STOOL/NOT TO EXCEED 6 TABLETS IN 24 HOURS FOR 2 DAYS (MAY ALSO TAKE 4 MG AS NEEDED PRE-DIALYSIS)   LORAZEPAM (ATIVAN) 0.5 MG TABLET    Take 1 tablet (0.5 mg total) by mouth at bedtime.   MULTIVITAMIN (RENA-VIT) TABS TABLET    Take 1 tablet by mouth daily.   NUTRITIONAL SUPPLEMENTS (NEPRO) LIQD    Take 270 mLs by mouth 2 (two) times daily.   ONDANSETRON (ZOFRAN ODT) 4 MG DISINTEGRATING TABLET    Take 1 tablet (4 mg total) by mouth every 8 (eight) hours as needed for nausea or vomiting.   PANTOPRAZOLE (PROTONIX) 40 MG TABLET    TAKE 1 TABLET BY MOUTH DAILY   PROMETHAZINE (PHENERGAN) 25 MG TABLET    Take 25 mg by mouth every 6 (six) hours as needed for nausea or vomiting.   RANITIDINE (ZANTAC) 300 MG TABLET    Take 300 mg by mouth at bedtime. Reported on 03/05/2016   SERTRALINE (ZOLOFT) 50 MG TABLET    Take 50 mg by mouth daily.    SEVELAMER CARBONATE (RENVELA) 800 MG TABLET    Take 1,600 mg by mouth 3 (three) times daily with meals.    SIMETHICONE (MYLICON) 782 MG CHEWABLE TABLET    Take 1 tablet by mouth prior to each meal.   SODIUM PHOSPHATES (RA SALINE ENEMA) 19-7 GM/118ML ENEM    Place 1 enema rectally once as needed (if constipation not relieved by Dulcolax suppository). CALL DOCTOR IF NO RELIEF FROM THIS   VALACYCLOVIR (VALTREX) 500 MG TABLET    Take 1 tablet (500 mg total) by mouth every other day.  Modified Medications   No medications on file  Discontinued Medications   No medications on file     Physical Exam: Vitals:    03/13/17 0904  BP: 139/89  Pulse: 85  Resp: 20  Temp: 98.9 F (37.2 C)  TempSrc: Oral  SpO2: 99%  Weight: 141 lb 15.6 oz (64.4 kg)  Height: '5\' 7"'  (1.702 m)    Physical Exam  Constitutional: She appears well-developed and well-nourished. No distress.  Guarded, elderly female  HENT:  Head: Normocephalic and atraumatic.  Nose: Nose normal.  Mouth/Throat: Oropharynx is clear and moist. No oropharyngeal exudate.  Eyes: Pupils are equal, round, and reactive to light. Right eye exhibits no discharge. Left eye exhibits no discharge. No scleral icterus.  Neck: No JVD present. Carotid bruit is not present.  Cardiovascular: Normal rate, regular rhythm and normal heart  sounds.   Left arm AVF with thrill/audible bruit.  Pulmonary/Chest: Effort normal and breath sounds normal. No respiratory distress. She has no wheezes. She has no rales.  Abdominal: Soft. Bowel sounds are normal. She exhibits no distension. There is no hepatomegaly. There is no tenderness. There is no guarding.  Musculoskeletal: She exhibits no edema or tenderness.  Neurological: She is alert.  AO x 2  Skin: Skin is warm and dry. No rash noted.  Psychiatric: She has a normal mood and affect.    Labs reviewed: Basic Metabolic Panel:  Recent Labs  03/14/16 0501  10/10/16 01/02/17 01/13/17 01/16/17 1725  NA 127*  --   --  133*  --  135  K 3.7  --   --  5.8*  --  6.7*  CL 91*  --   --   --   --  98*  CO2 29  --   --   --   --  25  GLUCOSE 75  --   --   --   --  75  BUN 13  --   --   --  65* 67*  CREATININE 3.48*  < > 5.3* 10.2*  --  10.08*  CALCIUM 7.8*  --   --   --   --  8.8*  < > = values in this interval not displayed. Liver Function Tests: No results for input(s): AST, ALT, ALKPHOS, BILITOT, PROT, ALBUMIN in the last 8760 hours. No results for input(s): LIPASE, AMYLASE in the last 8760 hours. No results for input(s): AMMONIA in the last 8760 hours. CBC:  Recent Labs  03/14/16 0501  10/03/16 10/10/16  01/02/17 01/13/17 01/16/17 1725  WBC 3.8*  < > 3.9 3.5 9.4  --  3.5*  NEUTROABS  --   --  3 2  --   --  1.8  HGB 8.4*  --  10.5* 9.2* 11.2* 11.4* 11.5*  HCT 26.8*  --  33* 29* 36  --  36.3  MCV 100.8*  --   --   --   --   --  107.4*  PLT 199  --   --   --   --   --  140*  < > = values in this interval not displayed. TSH: No results for input(s): TSH in the last 8760 hours. A1C: Lab Results  Component Value Date   HGBA1C 4.9 03/13/2016   Lipid Panel: No results for input(s): CHOL, HDL, LDLCALC, TRIG, CHOLHDL, LDLDIRECT in the last 8760 hours.   Assessment/Plan 1. ESRD (end stage renal disease) ([place]) Stable, conts on dialysis Tuesday, Thursday and Saturday. conts on rena-vit and phoslo  2. Mood disorder ([place]) Cont with agitation at times, following with psych. Will cont current regimen.   3. Essential hypertension Blood pressure stable, will cont norvasc and imdur  4. Anemia in neoplastic disease hgb stable on last labs in epic, being monitored at HD as well.   5. Gastroesophageal reflux disease without esophagitis Stable without worsening symptoms. conts on protonix.    Carlos American. Harle Battiest  [facility] [phone] 8 am - 5 pm) [phone] (after hours)

## 2017-04-15 ENCOUNTER — Encounter: Payer: Self-pay | Admitting: Adult Health

## 2017-04-15 ENCOUNTER — Non-Acute Institutional Stay (SKILLED_NURSING_FACILITY): Payer: Medicare Other | Admitting: Adult Health

## 2017-04-15 DIAGNOSIS — F329 Major depressive disorder, single episode, unspecified: Secondary | ICD-10-CM | POA: Diagnosis not present

## 2017-04-15 DIAGNOSIS — F39 Unspecified mood [affective] disorder: Secondary | ICD-10-CM

## 2017-04-15 DIAGNOSIS — C9000 Multiple myeloma not having achieved remission: Secondary | ICD-10-CM

## 2017-04-15 DIAGNOSIS — I10 Essential (primary) hypertension: Secondary | ICD-10-CM

## 2017-04-15 DIAGNOSIS — N186 End stage renal disease: Secondary | ICD-10-CM | POA: Diagnosis not present

## 2017-04-15 DIAGNOSIS — K219 Gastro-esophageal reflux disease without esophagitis: Secondary | ICD-10-CM | POA: Diagnosis not present

## 2017-04-15 DIAGNOSIS — F32A Depression, unspecified: Secondary | ICD-10-CM

## 2017-04-15 DIAGNOSIS — F419 Anxiety disorder, unspecified: Secondary | ICD-10-CM

## 2017-04-15 DIAGNOSIS — D630 Anemia in neoplastic disease: Secondary | ICD-10-CM | POA: Diagnosis not present

## 2017-04-15 NOTE — Progress Notes (Signed)
DATE:  04/15/2017   MRN:  [id number]  BIRTHDAY: [birth year]  Facility:  Nursing Home Location:  [facility] Room Number: 315-Q  LEVEL OF CARE:  SNF (31)  Contact Information    Name Relation Home Work [place] Daughter [phone]  [phone]   Gueye,Lewis Brother   [phone]       Code Status History    Date Active Date Inactive Code Status Order ID Comments User Context   03/13/2016  3:32 AM 03/14/2016  8:20 PM Full Code [id number]  Toy Baker, MD ED   02/18/2016  2:29 AM 03/01/2016 10:37 PM Full Code [id number]  Toy Baker, MD ED   09/02/2015 12:51 AM 09/09/2015  1:52 AM Full Code [id number]  Allyne Gee, MD Inpatient   04/10/2015 10:22 PM 04/13/2015  7:07 PM Full Code [id number]  Gennaro Africa, MD Inpatient   10/16/2014  4:45 PM 10/19/2014  8:59 PM Full Code [id number]  Corky Sox, MD Inpatient   03/17/2014  1:24 PM 03/18/2014  4:59 PM Full Code [id number]  Markus Daft, MD Inpatient   03/11/2014  8:01 PM 03/17/2014  1:24 PM Full Code [id number]  Etta Quill, DO ED       Chief Complaint  Patient presents with  . Medical Management of Chronic Issues    Routine visit    HISTORY OF PRESENT ILLNESS:  This is a 80-YO female seen for a routine visit.  She is a long-term care resident of [facility]. 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  She was seen in the room today and denies any concerns. Protonix was recently discontinued. 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   PAST MEDICAL HISTORY:  Past Medical History:  Diagnosis Date  . Closed fracture of right distal femur ([place]) 09/01/2015  . ESRD (end stage renal disease) ([place]) 03/11/2014  . GERD (gastroesophageal reflux disease) 04/23/2015  . Hypertension associatd with end stage renal disease on dialysis 03/11/2014  . Malnutrition of  moderate degree 09/02/2015  . Multiple myeloma ([place]) 03/11/2014     CURRENT MEDICATIONS: Reviewed  Patient's Medications  New Prescriptions   No medications on file  Previous Medications   AMLODIPINE (NORVASC) 10 MG TABLET    Take 10 mg by mouth daily. HOLD UNTIL AFTER DIALYSIS ON TUES/THURS/SAT AND HOLD FOR SYSTOLIC B/P READING OF 497 OR LESS   BISACODYL (BISAC-EVAC) 10 MG SUPPOSITORY    Place 10 mg rectally daily as needed for moderate constipation.   CALCIUM ACETATE (PHOSLO) 667 MG CAPSULE    Take 1,334 mg by mouth 3 (three) times daily.    CINACALCET (SENSIPAR) 60 MG TABLET    Take 60 mg by mouth daily.   CLONIDINE (CATAPRES) 0.1 MG TABLET    Take 0.1 mg by mouth every 8 (eight) hours as needed (for a systolic B/P of 026 or greater).    DEXTROMETHORPHAN HBR PO    Take 20 mg by mouth every 8 (eight) hours as needed (for cough).    HYDROCORTISONE CREAM 1 %    Apply 1 application topically See admin instructions. APPLY TO RECTUM AS NEEDED FOR HEMORRHOIDS - q8h prn   ISOSORBIDE MONONITRATE (IMDUR) 60 MG 24 HR TABLET    Take 60 mg by mouth daily.    LAMOTRIGINE (LAMICTAL) 25 MG TABLET    Take 50 mg by  mouth daily. Take 2 tablets to = 50 mg   LOPERAMIDE (IMODIUM A-D) 2 MG TABLET    Take 2 mg by mouth See admin instructions. AFTER EACH LOOSE STOOL/NOT TO EXCEED 6 TABLETS IN 24 HOURS FOR 2 DAYS (MAY ALSO TAKE 4 MG AS NEEDED PRE-DIALYSIS)   LORAZEPAM (ATIVAN) 0.5 MG TABLET    Take 1 tablet (0.5 mg total) by mouth at bedtime.   MULTIVITAMIN (RENA-VIT) TABS TABLET    Take 1 tablet by mouth daily.   NUTRITIONAL SUPPLEMENTS (NEPRO) LIQD    Take 270 mLs by mouth 2 (two) times daily.   ONDANSETRON (ZOFRAN ODT) 4 MG DISINTEGRATING TABLET    Take 1 tablet (4 mg total) by mouth every 8 (eight) hours as needed for nausea or vomiting.   PROMETHAZINE (PHENERGAN) 25 MG TABLET    Take 25 mg by mouth every 6 (six) hours as needed for nausea or vomiting.   RANITIDINE (ZANTAC) 300 MG TABLET    Take 300 mg by mouth  at bedtime. Reported on 03/05/2016   SERTRALINE (ZOLOFT) 50 MG TABLET    Take 50 mg by mouth daily.    SEVELAMER CARBONATE (RENVELA) 800 MG TABLET    Take 1,600 mg by mouth 3 (three) times daily with meals.    SIMETHICONE (MYLICON) 242 MG CHEWABLE TABLET    Take 1 tablet by mouth prior to each meal.   SODIUM PHOSPHATES (RA SALINE ENEMA) 19-7 GM/118ML ENEM    Place 1 enema rectally once as needed (if constipation not relieved by Dulcolax suppository). CALL DOCTOR IF NO RELIEF FROM THIS   VALACYCLOVIR (VALTREX) 500 MG TABLET    Take 1 tablet (500 mg total) by mouth every other day.  Modified Medications   No medications on file  Discontinued Medications   No medications on file     No Known Allergies   REVIEW OF SYSTEMS:  GENERAL: no change in appetite, no fatigue, no weight changes, no fever, chills or weakness EYES: Denies change in vision, dry eyes, eye pain, itching or discharge EARS: Denies change in hearing, ringing in ears, or earache NOSE: Denies nasal congestion or epistaxis MOUTH and THROAT: Denies oral discomfort, gingival pain or bleeding, pain from teeth or hoarseness   RESPIRATORY: no cough, SOB, DOE, wheezing, hemoptysis CARDIAC: no chest pain or palpitations, +edema GI: no abdominal pain, diarrhea, constipation, heart burn, nausea or vomiting GU: Denies dysuria, frequency, hematuria, incontinence, or discharge PSYCHIATRIC: Denies feeling of depression or anxiety. No report of hallucinations, insomnia, paranoia, or agitation     PHYSICAL EXAMINATION  GENERAL APPEARANCE: Well nourished. In no acute distress. Normal body habitus SKIN:  Skin is warm and dry.  HEAD: Normal in size and contour. No evidence of trauma EYES: Lids open and close normally. No blepharitis, entropion or ectropion. PERRL. Conjunctivae are clear and sclerae are white. Lenses are without opacity EARS: Pinnae are normal. Patient hears normal voice tunes of the examiner MOUTH and THROAT: Lips are  without lesions. Oral mucosa is moist and without lesions. Tongue is normal in shape, size, and color and without lesions NECK: supple, trachea midline, no neck masses, no thyroid tenderness, no thyromegaly LYMPHATICS: no LAN in the neck, no supraclavicular LAN RESPIRATORY: breathing is even & unlabored, BS CTAB CARDIAC: RRR, no murmur,no extra heart sounds, BLE 1+ edema, Left upper extremity with AV fistula + bruit/thrill GI: abdomen soft, normal BS, no masses, no tenderness, no hepatomegaly, no splenomegaly EXTREMITIES:  Able to move X 4 extremities  PSYCHIATRIC:  Alert and oriented X 3. Affect and behavior are appropriate    LABS/RADIOLOGY: Labs reviewed: 7 /11/18     sodium 136 K4.8  CL 96  CO2 30.0  BUN 28 creatinine 5.56 GFR 9 and anion gap 10.9 glucose 93 calcium 8.7 albumin 3.9 total protein 8.2 total bilirubin 0.5 AST 10 alkaline phosphatase 118 WBC 4.4 hemoglobin 9.6 hematocrit 29.9 MCV 32.4 platelet 948  Basic Metabolic Panel:  Recent Labs  10/10/16 01/02/17 01/13/17 01/16/17 1725  NA  --  133*  --  135  K  --  5.8*  --  6.7*  CL  --   --   --  98*  CO2  --   --   --  25  GLUCOSE  --   --   --  75  BUN  --   --  65* 67*  CREATININE 5.3* 10.2*  --  10.08*  CALCIUM  --   --   --  8.8*   CBC:  Recent Labs  10/03/16 10/10/16 01/02/17 01/13/17 01/16/17 1725  WBC 3.9 3.5 9.4  --  3.5*  NEUTROABS 3 2  --   --  1.8  HGB 10.5* 9.2* 11.2* 11.4* 11.5*  HCT 33* 29* 36  --  36.3  MCV  --   --   --   --  107.4*  PLT  --   --   --   --  140*     ASSESSMENT/PLAN:    1. Essential hypertension - stable; continue amlodipine 10 mg 1 tab by mouth daily   2. Anemia in neoplastic disease - hgb 9.6, stable   3. Mood disorder (H[facility]) -  Stable; continue Lamictal 50 mg Q D   4. ESRD (end stage renal disease) ([place]) - on hemodialysis Q TThS, continue Sevelamer 800 mg give 2 tabs = 1600 mg PO TID   5. Chronic depression - verbalized feeling "good", continue Sertraline 50 mg 1 tab  PO Q D   6. Multiple myeloma, remission status unspecified ([place]) - currently on Velcade, cyclosphomide and dexamethasone (VCD) chemotherapy with [facility]   7. Gastroesophageal reflux disease without esophagitis - stable; continue Ranitidine 300 mg 1 tab PO Q HS   8. Anxiety - stable; continue Lorazepam 0.5 mg 1 tab PO Q HS       Goals of care:  Long-term care     Shiah Berhow C. [place] - NP    [employer] [phone]

## 2017-04-23 ENCOUNTER — Encounter: Payer: Self-pay | Admitting: Adult Health

## 2017-04-23 ENCOUNTER — Non-Acute Institutional Stay (SKILLED_NURSING_FACILITY): Payer: Medicare Other | Admitting: Adult Health

## 2017-04-23 DIAGNOSIS — J189 Pneumonia, unspecified organism: Secondary | ICD-10-CM | POA: Diagnosis not present

## 2017-04-23 DIAGNOSIS — K5901 Slow transit constipation: Secondary | ICD-10-CM | POA: Diagnosis not present

## 2017-04-23 DIAGNOSIS — R5081 Fever presenting with conditions classified elsewhere: Secondary | ICD-10-CM | POA: Diagnosis not present

## 2017-04-23 DIAGNOSIS — I10 Essential (primary) hypertension: Secondary | ICD-10-CM | POA: Diagnosis not present

## 2017-04-23 LAB — CBC AND DIFFERENTIAL
HCT: 30 — AB (ref 36–46)
HEMOGLOBIN: 9.5 — AB (ref 12.0–16.0)
NEUTROS ABS: 5
PLATELETS: 119 — AB (ref 150–399)
WBC: 5.6

## 2017-04-23 LAB — BASIC METABOLIC PANEL
BUN: 61 — AB (ref 4–21)
Creatinine: 8.5 — AB (ref 0.5–1.1)
Glucose: 92
Potassium: 4.9 (ref 3.4–5.3)
Sodium: 136 — AB (ref 137–147)

## 2017-04-23 LAB — HEPATIC FUNCTION PANEL
ALK PHOS: 117 (ref 25–125)
ALT: 8 (ref 7–35)
AST: 16 (ref 13–35)
Bilirubin, Total: 0.5

## 2017-04-23 NOTE — Progress Notes (Addendum)
DATE:  04/23/2017   MRN:  [id number]  BIRTHDAY: [birth year]  Facility:  Nursing Home Location:  [facility]    LEVEL OF CARE:  SNF (31)  Contact Information    Name Relation Home Work [place] Daughter [phone]  [phone]   Hardigree,Lewis Brother   [phone]       Code Status History    Date Active Date Inactive Code Status Order ID Comments User Context   03/13/2016  3:32 AM 03/14/2016  8:20 PM Full Code [id number]  Toy Baker, MD ED   02/18/2016  2:29 AM 03/01/2016 10:37 PM Full Code [id number]  Toy Baker, MD ED   09/02/2015 12:51 AM 09/09/2015  1:52 AM Full Code [id number]  Allyne Gee, MD Inpatient   04/10/2015 10:22 PM 04/13/2015  7:07 PM Full Code [id number]  Gennaro Africa, MD Inpatient   10/16/2014  4:45 PM 10/19/2014  8:59 PM Full Code [id number]  Corky Sox, MD Inpatient   03/17/2014  1:24 PM 03/18/2014  4:59 PM Full Code [id number]  Markus Daft, MD Inpatient   03/11/2014  8:01 PM 03/17/2014  1:24 PM Full Code [id number]  Etta Quill, DO ED       Chief Complaint  Patient presents with  . Acute Visit    Elevated blood pressure and temperature    HISTORY OF PRESENT ILLNESS:  This is a 53-YO female seen for an acute visit secondary to elevated blood pressure and temperature.  She is a long-term care resident of [facility]. She was seen in her room today. She was supposed to have dialysis today but she verbalized being weak and does not want to do dialysis today. She was noted to be confused and was found to be on the floor. She did not complain of any pain. She had a large Bowel movement  with blood streaks. She was thought to be constipated. BP was 193/105 and was given Clonidine 0.1 mg PO X 1 then BP went down to 160/82. Temp was noted to be 100.2 and was given Tylenol PRN. She reports having dry cough. She has a PMH of multiple meyloma, ESRD on hemodialysis, HTN, leukopenia, anemia due to neopalstic  disease, GERD and severe malnutrition.    PAST MEDICAL HISTORY:  Past Medical History:  Diagnosis Date  . Closed fracture of right distal femur ([place]) 09/01/2015  . ESRD (end stage renal disease) ([place]) 03/11/2014  . GERD (gastroesophageal reflux disease) 04/23/2015  . Hypertension associatd with end stage renal disease on dialysis 03/11/2014  . Malnutrition of moderate degree 09/02/2015  . Multiple myeloma ([place]) 03/11/2014     CURRENT MEDICATIONS: Reviewed  Patient's Medications  New Prescriptions   No medications on file  Previous Medications   AMLODIPINE (NORVASC) 10 MG TABLET    Take 10 mg by mouth daily. HOLD UNTIL AFTER DIALYSIS ON TUES/THURS/SAT AND HOLD FOR SYSTOLIC B/P READING OF 892 OR LESS   BISACODYL (BISAC-EVAC) 10 MG SUPPOSITORY    Place 10 mg rectally daily as needed for moderate constipation.   CALCIUM ACETATE (PHOSLO) 667 MG CAPSULE    Take 1,334 mg by mouth 3 (three) times daily.    CINACALCET (SENSIPAR) 60 MG TABLET    Take 60 mg by mouth daily.   CLONIDINE (CATAPRES) 0.1 MG TABLET    Take 0.1 mg by mouth every 8 (eight) hours as needed (for a systolic B/P of 119 or greater).    DEXTROMETHORPHAN HBR PO  Take 20 mg by mouth every 8 (eight) hours as needed (for cough).    HYDROCORTISONE CREAM 1 %    Apply 1 application topically See admin instructions. APPLY TO RECTUM AS NEEDED FOR HEMORRHOIDS - q8h prn   ISOSORBIDE MONONITRATE (IMDUR) 60 MG 24 HR TABLET    Take 60 mg by mouth daily.    LAMOTRIGINE (LAMICTAL) 25 MG TABLET    Take 50 mg by mouth daily. Take 2 tablets to = 50 mg   LOPERAMIDE (IMODIUM A-D) 2 MG TABLET    Take 2 mg by mouth See admin instructions. AFTER EACH LOOSE STOOL/NOT TO EXCEED 6 TABLETS IN 24 HOURS FOR 2 DAYS (MAY ALSO TAKE 4 MG AS NEEDED PRE-DIALYSIS)   LORAZEPAM (ATIVAN) 0.5 MG TABLET    Take 1 tablet (0.5 mg total) by mouth at bedtime.   MULTIVITAMIN (RENA-VIT) TABS TABLET    Take 1 tablet by mouth daily.   NUTRITIONAL SUPPLEMENTS (NEPRO) LIQD     Take 270 mLs by mouth 2 (two) times daily.   ONDANSETRON (ZOFRAN ODT) 4 MG DISINTEGRATING TABLET    Take 1 tablet (4 mg total) by mouth every 8 (eight) hours as needed for nausea or vomiting.   PROMETHAZINE (PHENERGAN) 25 MG TABLET    Take 25 mg by mouth every 6 (six) hours as needed for nausea or vomiting.   RANITIDINE (ZANTAC) 300 MG TABLET    Take 300 mg by mouth at bedtime. Reported on 03/05/2016   SERTRALINE (ZOLOFT) 50 MG TABLET    Take 50 mg by mouth daily.    SEVELAMER CARBONATE (RENVELA) 800 MG TABLET    Take 1,600 mg by mouth 3 (three) times daily with meals.    SIMETHICONE (MYLICON) 250 MG CHEWABLE TABLET    Take 1 tablet by mouth prior to each meal.   SODIUM PHOSPHATES (RA SALINE ENEMA) 19-7 GM/118ML ENEM    Place 1 enema rectally once as needed (if constipation not relieved by Dulcolax suppository). CALL DOCTOR IF NO RELIEF FROM THIS   VALACYCLOVIR (VALTREX) 500 MG TABLET    Take 1 tablet (500 mg total) by mouth every other day.  Modified Medications   No medications on file  Discontinued Medications   No medications on file     No Known Allergies   REVIEW OF SYSTEMS:  GENERAL: no change in appetite, no fatigue, no weight changes, +fever EYES: Denies change in vision, dry eyes, eye pain, itching or discharge EARS: Denies change in hearing, ringing in ears, or earache NOSE: Denies nasal congestion or epistaxis MOUTH and THROAT: Denies oral discomfort, gingival pain or bleeding, pain from teeth or hoarseness   RESPIRATORY: no SOB, DOE, wheezing, hemoptysis, +cough CARDIAC: no chest pain or palpitations, +edema GI: no abdominal pain, diarrhea, heart burn, nausea or vomiting, +constipation GU: Denies dysuria, frequency, hematuria, incontinence, or discharge PSYCHIATRIC: Denies feeling of depression or anxiety. No report of hallucinations, insomnia, paranoia, or agitation     PHYSICAL EXAMINATION  GENERAL APPEARANCE: Well nourished. In no acute distress. Normal body  habitus SKIN:  Skin is warm and dry. HEAD: Normal in size and contour. No evidence of trauma EYES: Lids open and close normally. No blepharitis, entropion or ectropion. PERRL. Conjunctivae are clear and sclerae are white. Lenses are without opacity EARS: Pinnae are normal. Patient hears normal voice tunes of the examiner MOUTH and THROAT: Lips are without lesions. Oral mucosa is moist and without lesions. Tongue is normal in shape, size, and color and without lesions NECK:  supple, trachea midline, no neck masses, no thyroid tenderness, no thyromegaly LYMPHATICS: no LAN in the neck, no supraclavicular LAN RESPIRATORY: breathing is even & unlabored, BS CTAB CARDIAC: RRR, no murmur,no extra heart sounds, BLE trace edema, Left upper extremity with AV fistula + bruit/thrill GI: abdomen soft, normal BS, no masses, no tenderness, no hepatomegaly, no splenomegaly EXTREMITIES:  Able to move X 4 extremities PSYCHIATRIC: Alert and oriented X 3. Affect and behavior are appropriate    LABS/RADIOLOGY: Labs reviewed: 7 /11/18     sodium 136 K4.8  CL 96  CO2 30.0  BUN 28 creatinine 5.56 GFR 9 and anion gap 10.9 glucose 93 calcium 8.7 albumin 3.9 total protein 8.2 total bilirubin 0.5 AST 10 alkaline phosphatase 118 WBC 4.4 hemoglobin 9.6 hematocrit 29.9 MCV 32.4 platelet 932  Basic Metabolic Panel:  Recent Labs  10/10/16 01/02/17 01/13/17 01/16/17 1725  NA  --  133*  --  135  K  --  5.8*  --  6.7*  CL  --   --   --  98*  CO2  --   --   --  25  GLUCOSE  --   --   --  75  BUN  --   --  65* 67*  CREATININE 5.3* 10.2*  --  10.08*  CALCIUM  --   --   --  8.8*   CBC:  Recent Labs  10/03/16 10/10/16 01/02/17 01/13/17 01/16/17 1725  WBC 3.9 3.5 9.4  --  3.5*  NEUTROABS 3 2  --   --  1.8  HGB 10.5* 9.2* 11.2* 11.4* 11.5*  HCT 33* 29* 36  --  36.3  MCV  --   --   --   --  107.4*  PLT  --   --   --   --  140*   ASSESSMENT/PLAN:  1. HCAP - CXR showed mild atelectatic changes/pneumonia at the right  lower lobe, will start patient on Rocephine 1 gm IM Q D X 7 days, Florastor 250 mg 1 capsule PO BID X 10 days and albuterol 2.5 mg/87m 1 nebulization Q 6AM, 6PM and 10 PM X 3 days then Q 6 hours PRN  2. Uncontrolled hypertension - BP re-checked again = 140/76, continue Amlodipine 10 mg 1 tab PO Q D and Clonidine 0.1 mg 1 tab PO Q 8 hours PRN  3. Slow transit constipation - start Sennas-S 8.6-50 mg 2 tabs PO BID X 3 days then Q HS  4. Fever - due to PNA, start Tylenol 325 mg give 2 tabs = 650 mg PO Q 6 hours PRN      Monina C. MWhiteface- NP   [phone]

## 2017-04-24 ENCOUNTER — Emergency Department (HOSPITAL_COMMUNITY): Payer: Medicare Other

## 2017-04-24 ENCOUNTER — Encounter (HOSPITAL_COMMUNITY): Payer: Self-pay | Admitting: Emergency Medicine

## 2017-04-24 ENCOUNTER — Inpatient Hospital Stay (HOSPITAL_COMMUNITY)
Admission: EM | Admit: 2017-04-24 | Discharge: 2017-04-26 | DRG: 871 | Disposition: A | Discharge status: Skilled Nursing Facility | Payer: Medicare Other | Attending: Internal Medicine | Admitting: Internal Medicine

## 2017-04-24 DIAGNOSIS — Y95 Nosocomial condition: Secondary | ICD-10-CM | POA: Diagnosis present

## 2017-04-24 DIAGNOSIS — M898X9 Other specified disorders of bone, unspecified site: Secondary | ICD-10-CM | POA: Diagnosis present

## 2017-04-24 DIAGNOSIS — N186 End stage renal disease: Secondary | ICD-10-CM | POA: Diagnosis not present

## 2017-04-24 DIAGNOSIS — D631 Anemia in chronic kidney disease: Secondary | ICD-10-CM | POA: Diagnosis present

## 2017-04-24 DIAGNOSIS — Z79899 Other long term (current) drug therapy: Secondary | ICD-10-CM | POA: Diagnosis not present

## 2017-04-24 DIAGNOSIS — Z992 Dependence on renal dialysis: Secondary | ICD-10-CM

## 2017-04-24 DIAGNOSIS — F329 Major depressive disorder, single episode, unspecified: Secondary | ICD-10-CM | POA: Diagnosis present

## 2017-04-24 DIAGNOSIS — F419 Anxiety disorder, unspecified: Secondary | ICD-10-CM | POA: Diagnosis present

## 2017-04-24 DIAGNOSIS — D696 Thrombocytopenia, unspecified: Secondary | ICD-10-CM | POA: Diagnosis present

## 2017-04-24 DIAGNOSIS — J189 Pneumonia, unspecified organism: Secondary | ICD-10-CM | POA: Diagnosis present

## 2017-04-24 DIAGNOSIS — Z7951 Long term (current) use of inhaled steroids: Secondary | ICD-10-CM

## 2017-04-24 DIAGNOSIS — C9000 Multiple myeloma not having achieved remission: Secondary | ICD-10-CM | POA: Diagnosis not present

## 2017-04-24 DIAGNOSIS — I10 Essential (primary) hypertension: Secondary | ICD-10-CM | POA: Diagnosis not present

## 2017-04-24 DIAGNOSIS — R112 Nausea with vomiting, unspecified: Secondary | ICD-10-CM | POA: Diagnosis present

## 2017-04-24 DIAGNOSIS — E875 Hyperkalemia: Secondary | ICD-10-CM | POA: Diagnosis present

## 2017-04-24 DIAGNOSIS — D630 Anemia in neoplastic disease: Secondary | ICD-10-CM | POA: Diagnosis present

## 2017-04-24 DIAGNOSIS — A419 Sepsis, unspecified organism: Principal | ICD-10-CM | POA: Diagnosis present

## 2017-04-24 DIAGNOSIS — I1311 Hypertensive heart and chronic kidney disease without heart failure, with stage 5 chronic kidney disease, or end stage renal disease: Secondary | ICD-10-CM | POA: Diagnosis present

## 2017-04-24 DIAGNOSIS — I248 Other forms of acute ischemic heart disease: Secondary | ICD-10-CM | POA: Diagnosis present

## 2017-04-24 DIAGNOSIS — D6959 Other secondary thrombocytopenia: Secondary | ICD-10-CM | POA: Diagnosis present

## 2017-04-24 DIAGNOSIS — K219 Gastro-esophageal reflux disease without esophagitis: Secondary | ICD-10-CM | POA: Diagnosis present

## 2017-04-24 HISTORY — DX: Pneumonia, unspecified organism: J18.9

## 2017-04-24 HISTORY — DX: Anxiety disorder, unspecified: F41.9

## 2017-04-24 HISTORY — DX: Other specified health status: Z78.9

## 2017-04-24 HISTORY — DX: Cardiac murmur, unspecified: R01.1

## 2017-04-24 HISTORY — DX: Personal history of other medical treatment: Z92.89

## 2017-04-24 HISTORY — DX: Major depressive disorder, single episode, unspecified: F32.9

## 2017-04-24 HISTORY — DX: Reserved for inherently not codable concepts without codable children: IMO0001

## 2017-04-24 HISTORY — DX: Depression, unspecified: F32.A

## 2017-04-24 HISTORY — DX: End stage renal disease: N18.6

## 2017-04-24 HISTORY — DX: Dependence on renal dialysis: Z99.2

## 2017-04-24 LAB — CBC WITH DIFFERENTIAL/PLATELET
BASOS ABS: 0 10*3/uL (ref 0.0–0.1)
Basophils Relative: 0 %
EOS ABS: 0 10*3/uL (ref 0.0–0.7)
Eosinophils Relative: 0 %
HCT: 30.2 % — ABNORMAL LOW (ref 36.0–46.0)
Hemoglobin: 9.8 g/dL — ABNORMAL LOW (ref 12.0–15.0)
LYMPHS ABS: 0.2 10*3/uL — AB (ref 0.7–4.0)
Lymphocytes Relative: 4 %
MCH: 32.6 pg (ref 26.0–34.0)
MCHC: 32.5 g/dL (ref 30.0–36.0)
MCV: 100.3 fL — ABNORMAL HIGH (ref 78.0–100.0)
MONO ABS: 0.1 10*3/uL (ref 0.1–1.0)
Monocytes Relative: 2 %
NEUTROS ABS: 5 10*3/uL (ref 1.7–7.7)
Neutrophils Relative %: 94 %
Platelets: 81 10*3/uL — ABNORMAL LOW (ref 150–400)
RBC: 3.01 MIL/uL — ABNORMAL LOW (ref 3.87–5.11)
RDW: 18.5 % — AB (ref 11.5–15.5)
WBC: 5.3 10*3/uL (ref 4.0–10.5)

## 2017-04-24 LAB — COMPREHENSIVE METABOLIC PANEL
ALK PHOS: 112 U/L (ref 38–126)
ALT: 26 U/L (ref 14–54)
AST: 96 U/L — ABNORMAL HIGH (ref 15–41)
Albumin: 3.2 g/dL — ABNORMAL LOW (ref 3.5–5.0)
Anion gap: 15 (ref 5–15)
BUN: 59 mg/dL — ABNORMAL HIGH (ref 6–20)
CALCIUM: 8.7 mg/dL — AB (ref 8.9–10.3)
CHLORIDE: 96 mmol/L — AB (ref 101–111)
CO2: 23 mmol/L (ref 22–32)
CREATININE: 8.6 mg/dL — AB (ref 0.44–1.00)
GFR calc Af Amer: 5 mL/min — ABNORMAL LOW (ref >60)
GFR, EST NON AFRICAN AMERICAN: 4 mL/min — AB (ref >60)
Glucose, Bld: 88 mg/dL (ref 65–99)
Potassium: 4.9 mmol/L (ref 3.5–5.1)
Sodium: 134 mmol/L — ABNORMAL LOW (ref 135–145)
Total Bilirubin: 0.9 mg/dL (ref 0.3–1.2)
Total Protein: 7.6 g/dL (ref 6.5–8.1)

## 2017-04-24 LAB — I-STAT CG4 LACTIC ACID, ED
LACTIC ACID, VENOUS: 2.19 mmol/L — AB (ref 0.5–1.9)
Lactic Acid, Venous: 1.31 mmol/L (ref 0.5–1.9)

## 2017-04-24 LAB — PROTIME-INR
INR: 1.62
Prothrombin Time: 19.4 seconds — ABNORMAL HIGH (ref 11.4–15.2)

## 2017-04-24 LAB — PROCALCITONIN: Procalcitonin: >150 ng/mL

## 2017-04-24 LAB — CBC AND DIFFERENTIAL
HCT: 26 — AB (ref 36–46)
HEMOGLOBIN: 8.6 — AB (ref 12.0–16.0)
Neutrophils Absolute: 2
Platelets: 148 — AB (ref 150–399)
WBC: 3.3

## 2017-04-24 LAB — LACTIC ACID, PLASMA: LACTIC ACID, VENOUS: 1.3 mmol/L (ref 0.5–1.9)

## 2017-04-24 LAB — I-STAT TROPONIN, ED: Troponin i, poc: 1.06 ng/mL (ref 0.00–0.08)

## 2017-04-24 LAB — TROPONIN I: Troponin I: 1.64 ng/mL (ref <0.03)

## 2017-04-24 LAB — POC OCCULT BLOOD, ED: FECAL OCCULT BLD: NEGATIVE

## 2017-04-24 MED ORDER — SEVELAMER CARBONATE 800 MG PO TABS
1600.0000 mg | ORAL_TABLET | Freq: Three times a day (TID) | ORAL | Status: DC
  Administered 2017-04-25 – 2017-04-26 (×5): 1600 mg via ORAL
  Filled 2017-04-24 (×5): qty 2

## 2017-04-24 MED ORDER — SACCHAROMYCES BOULARDII 250 MG PO CAPS
250.0000 mg | ORAL_CAPSULE | Freq: Two times a day (BID) | ORAL | Status: DC
  Administered 2017-04-24 – 2017-04-26 (×3): 250 mg via ORAL
  Filled 2017-04-24 (×3): qty 1

## 2017-04-24 MED ORDER — AMLODIPINE BESYLATE 5 MG PO TABS: 10.0000 mg | ORAL_TABLET | Freq: Every day | ORAL | Status: DC

## 2017-04-24 MED ORDER — HEPARIN SODIUM (PORCINE) 5000 UNIT/ML IJ SOLN
5000.0000 [IU] | Freq: Three times a day (TID) | INTRAMUSCULAR | Status: DC
  Administered 2017-04-24 – 2017-04-26 (×5): 5000 [IU] via SUBCUTANEOUS
  Filled 2017-04-24 (×4): qty 1

## 2017-04-24 MED ORDER — DEXTROSE 5 % IV SOLN
2.0000 g | Freq: Once | INTRAVENOUS | Status: AC
  Administered 2017-04-24: 2 g via INTRAVENOUS
  Filled 2017-04-24: qty 2

## 2017-04-24 MED ORDER — ALBUTEROL SULFATE (2.5 MG/3ML) 0.083% IN NEBU: 2.5000 mg | INHALATION_SOLUTION | RESPIRATORY_TRACT | Status: DC | PRN

## 2017-04-24 MED ORDER — SODIUM CHLORIDE 0.9 % IV SOLN
1000.0000 mL | INTRAVENOUS | Status: DC
  Administered 2017-04-24: 1000 mL via INTRAVENOUS

## 2017-04-24 MED ORDER — ONDANSETRON HCL 4 MG/2ML IJ SOLN
4.0000 mg | Freq: Four times a day (QID) | INTRAMUSCULAR | Status: DC | PRN
  Administered 2017-04-25: 4 mg via INTRAVENOUS

## 2017-04-24 MED ORDER — DARBEPOETIN ALFA 200 MCG/0.4ML IJ SOSY
200.0000 ug | PREFILLED_SYRINGE | INTRAMUSCULAR | Status: DC
  Administered 2017-04-25: 200 ug via INTRAVENOUS
  Filled 2017-04-24: qty 0.4

## 2017-04-24 MED ORDER — LAMOTRIGINE 25 MG PO TABS
50.0000 mg | ORAL_TABLET | Freq: Every day | ORAL | Status: DC
  Administered 2017-04-25 – 2017-04-26 (×2): 50 mg via ORAL
  Filled 2017-04-24 (×2): qty 2

## 2017-04-24 MED ORDER — LEVOFLOXACIN 750 MG PO TABS: 750.0000 mg | ORAL_TABLET | Freq: Once | ORAL | Status: DC

## 2017-04-24 MED ORDER — CALCIUM ACETATE (PHOS BINDER) 667 MG PO CAPS
1334.0000 mg | ORAL_CAPSULE | Freq: Three times a day (TID) | ORAL | Status: DC
  Administered 2017-04-25 – 2017-04-26 (×5): 1334 mg via ORAL
  Filled 2017-04-24 (×5): qty 2

## 2017-04-24 MED ORDER — DOXERCALCIFEROL 4 MCG/2ML IV SOLN
7.0000 ug | INTRAVENOUS | Status: DC
  Administered 2017-04-25: 7 ug via INTRAVENOUS
  Filled 2017-04-24: qty 4

## 2017-04-24 MED ORDER — LORAZEPAM 0.5 MG PO TABS
0.5000 mg | ORAL_TABLET | Freq: Every day | ORAL | Status: DC
  Administered 2017-04-24: 0.5 mg via ORAL
  Filled 2017-04-24: qty 1

## 2017-04-24 MED ORDER — SODIUM CHLORIDE 0.9% FLUSH
3.0000 mL | Freq: Two times a day (BID) | INTRAVENOUS | Status: DC
  Administered 2017-04-24 – 2017-04-26 (×3): 3 mL via INTRAVENOUS

## 2017-04-24 MED ORDER — CINACALCET HCL 30 MG PO TABS
60.0000 mg | ORAL_TABLET | Freq: Every day | ORAL | Status: DC
  Administered 2017-04-25: 60 mg via ORAL
  Filled 2017-04-24 (×2): qty 2

## 2017-04-24 MED ORDER — RENA-VITE PO TABS
1.0000 | ORAL_TABLET | Freq: Every day | ORAL | Status: DC
  Administered 2017-04-25 – 2017-04-26 (×2): 1 via ORAL
  Filled 2017-04-24 (×2): qty 1

## 2017-04-24 MED ORDER — VALACYCLOVIR HCL 500 MG PO TABS: 500.0000 mg | ORAL_TABLET | ORAL | Status: DC

## 2017-04-24 MED ORDER — SEVELAMER CARBONATE 800 MG PO TABS: 1600.0000 mg | ORAL_TABLET | Freq: Three times a day (TID) | ORAL | Status: DC

## 2017-04-24 MED ORDER — PANTOPRAZOLE SODIUM 40 MG IV SOLR
40.0000 mg | INTRAVENOUS | Status: DC
  Administered 2017-04-24: 40 mg via INTRAVENOUS
  Filled 2017-04-24: qty 40

## 2017-04-24 MED ORDER — ACETAMINOPHEN 650 MG RE SUPP: 650.0000 mg | Freq: Four times a day (QID) | RECTAL | Status: DC | PRN

## 2017-04-24 MED ORDER — ACETAMINOPHEN 500 MG PO TABS
1000.0000 mg | ORAL_TABLET | Freq: Once | ORAL | Status: AC
  Administered 2017-04-24: 1000 mg via ORAL
  Filled 2017-04-24: qty 2

## 2017-04-24 MED ORDER — ONDANSETRON HCL 4 MG PO TABS: 4.0000 mg | ORAL_TABLET | Freq: Four times a day (QID) | ORAL | Status: DC | PRN

## 2017-04-24 MED ORDER — SERTRALINE HCL 50 MG PO TABS
50.0000 mg | ORAL_TABLET | Freq: Every day | ORAL | Status: DC
  Administered 2017-04-25 – 2017-04-26 (×2): 50 mg via ORAL
  Filled 2017-04-24 (×2): qty 1

## 2017-04-24 MED ORDER — DEXTROSE 5 % IV SOLN
1.0000 g | INTRAVENOUS | Status: DC
  Administered 2017-04-25: 1 g via INTRAVENOUS
  Filled 2017-04-24 (×2): qty 1

## 2017-04-24 MED ORDER — VANCOMYCIN HCL IN DEXTROSE 1-5 GM/200ML-% IV SOLN
1000.0000 mg | Freq: Once | INTRAVENOUS | Status: AC
  Administered 2017-04-24: 1000 mg via INTRAVENOUS
  Filled 2017-04-24: qty 200

## 2017-04-24 MED ORDER — ISOSORBIDE MONONITRATE ER 60 MG PO TB24
60.0000 mg | ORAL_TABLET | Freq: Every day | ORAL | Status: DC
  Administered 2017-04-25 – 2017-04-26 (×2): 60 mg via ORAL
  Filled 2017-04-24 (×2): qty 1

## 2017-04-24 MED ORDER — SODIUM CHLORIDE 0.9 % IV SOLN
500.0000 mg | Freq: Once | INTRAVENOUS | Status: AC
  Administered 2017-04-24: 500 mg via INTRAVENOUS
  Filled 2017-04-24: qty 500

## 2017-04-24 MED ORDER — VANCOMYCIN HCL IN DEXTROSE 750-5 MG/150ML-% IV SOLN
750.0000 mg | INTRAVENOUS | Status: DC
  Administered 2017-04-25: 750 mg via INTRAVENOUS
  Filled 2017-04-24: qty 150

## 2017-04-24 MED ORDER — ACETAMINOPHEN 325 MG PO TABS: 650.0000 mg | ORAL_TABLET | Freq: Four times a day (QID) | ORAL | Status: DC | PRN

## 2017-04-24 MED ORDER — SENNOSIDES-DOCUSATE SODIUM 8.6-50 MG PO TABS
2.0000 | ORAL_TABLET | Freq: Every day | ORAL | Status: DC
  Administered 2017-04-24: 2 via ORAL
  Filled 2017-04-24 (×2): qty 2

## 2017-04-24 NOTE — Progress Notes (Signed)
Results for COSIMA, PRENTISS (MRN [id number]) as of 04/24/2017 22:31  Ref. Range 04/24/2017 20:57  Troponin I Latest Ref Range: <0.03 ng/mL 1.64 (HH)  NP made aware. Awaiting return call.

## 2017-04-24 NOTE — ED Provider Notes (Signed)
  Face-to-face evaluation   History: Patient sent here from dialysis "because she looked bad."  She was treated for pneumonia with IM injections and oral pills according to a family member.  Patient states that she is comfortable.  Physical exam: Alert elderly female.  Lungs with scattered rhonchi and a few wheezes but no rales or increased work of breathing.  Fistula left upper arm is nontender and has normal thrill.  Medical screening examination/treatment/procedure(s) were conducted as a shared visit with non-physician practitioner(s) and myself.  I personally evaluated the patient during the encounter    Daleen Bo, MD 04/25/17 1105

## 2017-04-24 NOTE — ED Notes (Signed)
Attempted report 

## 2017-04-24 NOTE — ED Triage Notes (Signed)
Went in for dialysis then spiked a fever  101. after only  1 hour of dialysis, supposed to have tx tom also per ems theirs was 100.9 , sounds junky per ems

## 2017-04-24 NOTE — ED Notes (Signed)
Lactic acid result given to Dr. Wentz 

## 2017-04-24 NOTE — ED Notes (Signed)
I-stat troponin result given to Dr. Eulis Foster

## 2017-04-24 NOTE — Progress Notes (Signed)
Pharmacy Antibiotic Note [name] is a 76 y.o. female admitted on 04/24/2017 with concern for PNA in setting ongoing chemotherapy treatment for multiple myeloma.   Pharmacy has been consulted for cefepime and vancomycin dosing. Pt received 1 gram vancomycin and Cefepime 2 grams x 1 in ED. PCT noted to be > 150.   Plan: 1. Give vancomycin 500 mg IV x 1 to complete full loading dose and then transition to vancomycin 750 mg after each IHD session, currently TTS 2. Cefepime 1 gram IV every 24 hours starting on 7/26 at 18:00 3. Follow up nephrology plans and empirically adjust as needed   Temp (24hrs), Avg:102.4 F (39.1 C), Min:101.5 F (38.6 C), Max:103.2 F (39.6 C)   Recent Labs Lab 04/23/17 04/24/17 1158 04/24/17 1520 04/24/17 1810  WBC 5.6 5.3  --   --   CREATININE 8.5* 8.60*  --   --   LATICACIDVEN  --   --  2.19* 1.31    Estimated Creatinine Clearance: 5 mL/min (A) (by C-G formula based on SCr of 8.6 mg/dL (H)).    No Known Allergies  Antimicrobials this admission: 7/25 cefepime >>  7/25 vancomycin >>  Microbiology results: 7/25 BCx: px  Thank you for allowing pharmacy to be a part of this patient's care.  Duayne Cal 04/24/2017 7:47 PM

## 2017-04-24 NOTE — ED Provider Notes (Signed)
[facility] DEPT Provider Note   CSN: [id number] Arrival date & time: 04/24/17  1428     History   Chief Complaint Chief Complaint  Patient presents with  . Fever    HPI Sheana Bir is a 76 y.o. female with history of ESRD on dialysis presents with fever and cough. Patient spiked a fever one hour into dialysis today up to 101. She reports she has had productive cough for the past 3 days. She denies any fevers at home. Patient does make urine and denies any urinary symptoms. She denies any chest pain or shortness of breath. She denies any wounds. Patient notes she has had associated nausea with her cough for the past 3 days. No vomiting or abdominal pain. Patient was seen by [facility] [place] and given Rocephin IM for right lower lobe pneumonia seen on chest x-ray.   Fever   Associated symptoms include cough. Pertinent negatives include no chest pain, no vomiting, no headaches and no sore throat.    Past Medical History:  Diagnosis Date  . Closed fracture of right distal femur ([place]) 09/01/2015  . ESRD (end stage renal disease) ([place]) 03/11/2014  . GERD (gastroesophageal reflux disease) 04/23/2015  . Hypertension associatd with end stage renal disease on dialysis 03/11/2014  . Malnutrition of moderate degree 09/02/2015  . Multiple myeloma ([place]) 03/11/2014    Patient Active Problem List   Diagnosis Date Noted  . HCAP (healthcare-associated pneumonia) 04/24/2017  . Sepsis ([place]) 04/24/2017  . Nausea and vomiting 04/24/2017  . Thrombocytopenia ([place]) 04/24/2017  . Hyperkalemia 01/16/2017  . IBS (irritable bowel syndrome) 12/27/2016  . Mood disorder ([place]) 07/19/2016  . Diastolic dysfunction 16/10/930  . Insomnia 04/27/2016  . Hyponatremia 03/12/2016  . Symptomatic anemia 03/12/2016  . Diarrhea 03/12/2016  . Essential hypertension   . Swelling of joint of left knee 02/18/2016  . Leukopenia due to antineoplastic chemotherapy ([place]) 09/03/2015  . Malnutrition of  moderate degree 09/02/2015  . GERD (gastroesophageal reflux disease) 04/23/2015  . 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 ESRD (end stage renal disease) ([place]) 03/11/2014  . Multiple myeloma ([place]) 03/11/2014  . Hypertension associatd with end stage renal disease on dialysis 03/11/2014    Past Surgical History:  Procedure Laterality Date  . ABDOMINAL HYSTERECTOMY     Pt. denies  . I&D EXTREMITY Left 02/27/2016   Procedure: ARTHROSCOPIC IRRIGATION AND DEBRIDEMENT EXTREMITY;  Surgeon: Renette Butters, MD;  Location: [facility];  Service: Orthopedics;  Laterality: Left;  . TEE WITHOUT CARDIOVERSION N/A 02/29/2016   Procedure: TRANSESOPHAGEAL ECHOCARDIOGRAM (TEE);  Surgeon: Thayer Headings, MD;  Location: [facility];  Service: Cardiovascular;  Laterality: N/A;    OB History    No data available       Home Medications    Prior to Admission medications   Medication Sig Start Date End Date Taking? Authorizing Provider  acetaminophen (TYLENOL) 325 MG tablet Take 650 mg by mouth every 6 (six) hours as needed for fever (FOR 2 WEEKS). 04/23/17 05/13/17 Yes [provider]  albuterol (PROVENTIL) (2.5 MG/3ML) 0.083% nebulizer solution Take 3 mLs by nebulization 3 (three) times daily. FOR 3 DAYS STARTING ON 04/23/17 THEN EVERY 6 HOURS FOR 2 WEEKS AS NEEDED FOR SHORTNESS OF BREATH OR WHEEZING   Yes [provider]  amLODipine (NORVASC) 10 MG tablet Take 10 mg by mouth See admin instructions. HOLD UNTIL AFTER DIALYSIS ON TUES/THURS/SAT AND HOLD FOR SYSTOLIC B/P READING OF 355 OR LESS   Yes [provider]  bisacodyl (BISAC-EVAC) 10 MG suppository Place 10 mg rectally once as needed (FOR CONSTIPATION NOT RELIEVED BY MILK OF MAGNESIA).    Yes [provider]  calcium acetate (PHOSLO) 667 MG capsule Take 1,334 mg by mouth 3 (three) times daily.    Yes [provider]  cefTRIAXone (ROCEPHIN) 1 g injection Inject 1 g into the muscle daily. For 7 days and may be  reconstituted with Lidocaine 04/23/17 04/30/17 Yes [provider]  cinacalcet (SENSIPAR) 60 MG tablet Take 60 mg by mouth daily.   Yes [provider]  cloNIDine (CATAPRES) 0.1 MG tablet Take 0.1 mg by mouth every 8 (eight) hours as needed (for a systolic B/P of 612 or greater).    Yes [provider]  DEXTROMETHORPHAN HBR PO Take 20 mg by mouth every 8 (eight) hours as needed (for cough).    Yes [provider]  hydrocortisone cream 1 % Apply 1 application topically daily as needed (for hemorrhoids).    Yes [provider]  isosorbide mononitrate (IMDUR) 60 MG 24 hr tablet Take 60 mg by mouth daily.  03/24/15  Yes [provider]  lamoTRIgine (LAMICTAL) 25 MG tablet Take 50 mg by mouth daily.    Yes [provider]  loperamide (IMODIUM A-D) 2 MG tablet Take 2 mg by mouth See admin instructions. AFTER EACH LOOSE STOOL/NOT TO EXCEED 6 TABLETS IN 24 HOURS FOR 2 DAYS (MAY ALSO TAKE 4 MG AS NEEDED PRE-DIALYSIS)   Yes [provider]  LORazepam (ATIVAN) 0.5 MG tablet Take 1 tablet (0.5 mg total) by mouth at bedtime. 03/05/17  Yes Lauree Chandler, NP  magnesium hydroxide (MILK OF MAGNESIA) 400 MG/5ML suspension Take 15-30 mLs by mouth once as needed for mild constipation.   Yes [provider]  multivitamin (RENA-VIT) TABS tablet Take 1 tablet by mouth daily.   Yes [provider]  ondansetron (ZOFRAN ODT) 4 MG disintegrating tablet Take 1 tablet (4 mg total) by mouth every 8 (eight) hours as needed for nausea or vomiting. 01/21/15  Yes Everlene Balls, MD  promethazine (PHENERGAN) 25 MG tablet Take 25 mg by mouth every 6 (six) hours as needed for nausea or vomiting.   Yes [provider]  ranitidine (ZANTAC) 300 MG tablet Take 300 mg by mouth at bedtime. Reported on 03/05/2016   Yes [provider]  saccharomyces boulardii (FLORASTOR) 250 MG capsule Take 250 mg by mouth 2 (two) times daily. For 10 days  04/24/17 05/04/17 Yes [provider]  sennosides-docusate sodium (SENOKOT-S) 8.6-50 MG tablet Take 2 tablets by mouth at bedtime.   Yes [provider]  sertraline (ZOLOFT) 50 MG tablet Take 50 mg by mouth daily.    Yes [provider]  sevelamer carbonate (RENVELA) 800 MG tablet Take 1,600 mg by mouth 3 (three) times daily with meals.    Yes [provider]  simethicone (MYLICON) 244 MG chewable tablet Take 1 tablet by mouth prior to each meal.   Yes [provider]  Sodium Phosphates (RA SALINE ENEMA) 19-7 GM/118ML ENEM Place 1 enema rectally once as needed (if constipation not relieved by Dulcolax suppository). CALL DOCTOR IF NO RELIEF FROM THIS   Yes [provider]  UNABLE TO FIND Nephro supplement: Drink by mouth two times a day (may D/C if patient is consistently refusing)   Yes [provider]  valACYclovir (VALTREX) 500 MG tablet Take 1 tablet (500 mg total) by mouth every other day. 03/01/16  Yes Hongalgi, Lenis Dickinson, MD    Family History Family History  Problem Relation Age of Onset  . Hypertension Mother   . Hypertension Father     Social History Social History  Substance Use Topics  . Smoking status: Never Smoker  . Smokeless tobacco: Never Used  . Alcohol use No     Allergies   Patient has no known allergies.   Review of Systems Review of Systems  Constitutional: Positive for fever. Negative for chills.  HENT: Negative for facial swelling and sore throat.   Respiratory: Positive for cough. Negative for shortness of breath.   Cardiovascular: Negative for chest pain.  Gastrointestinal: Positive for nausea. Negative for abdominal pain and vomiting.  Genitourinary: Negative for dysuria and frequency.  Musculoskeletal: Negative for back pain.  Skin: Negative for rash and wound.  Neurological: Negative for headaches.  Psychiatric/Behavioral: The patient is not nervous/anxious.      Physical Exam Updated  Vital Signs BP 118/78   Pulse 76   Temp (!) 101.5 F (38.6 C) (Oral)   Resp 16   SpO2 97%   Physical Exam  Constitutional: She appears well-developed and well-nourished. No distress.  HENT:  Head: Normocephalic and atraumatic.  Mouth/Throat: Oropharynx is clear and moist. No oropharyngeal exudate.  Eyes: Pupils are equal, round, and reactive to light. Conjunctivae are normal. Right eye exhibits no discharge. Left eye exhibits no discharge. No scleral icterus.  Neck: Normal range of motion. Neck supple. No thyromegaly present.  Cardiovascular: Normal rate, regular rhythm, normal heart sounds and intact distal pulses.  Exam reveals no gallop and no friction rub.   No murmur heard. Pulmonary/Chest: Effort normal. No stridor. No respiratory distress. She has no wheezes. She has rales (bilateral, worse on L).  Abdominal: Soft. Bowel sounds are normal. She exhibits no distension. There is no tenderness. There is no rebound and no guarding.  Musculoskeletal: She exhibits no edema.  Lymphadenopathy:    She has no cervical adenopathy.  Neurological: She is alert. Coordination normal.  Skin: Skin is warm and dry. No rash noted. She is not diaphoretic. No pallor.  No wounds noted on full body exam  Psychiatric: She has a normal mood and affect.  Nursing note and vitals reviewed.    ED Treatments / Results  Labs (all labs ordered are listed, but only abnormal results are displayed) Labs Reviewed  COMPREHENSIVE METABOLIC PANEL - Abnormal; Notable for the following:       Result Value   Sodium 134 (*)    Chloride 96 (*)    BUN 59 (*)    Creatinine, Ser 8.60 (*)    Calcium 8.7 (*)    Albumin 3.2 (*)    AST 96 (*)    GFR calc non Af Amer 4 (*)    GFR calc Af Amer 5 (*)    All other components within normal limits  CBC WITH DIFFERENTIAL/PLATELET - Abnormal; Notable for the following:    RBC 3.01 (*)    Hemoglobin 9.8 (*)    HCT 30.2 (*)    MCV 100.3 (*)    RDW 18.5 (*)    Platelets  81 (*)    Lymphs Abs 0.2 (*)    All other components within normal limits  PROTIME-INR - Abnormal; Notable for the following:    Prothrombin Time 19.4 (*)    All other components within normal limits  I-STAT CG4 LACTIC ACID, ED - Abnormal; Notable for the following:    Lactic Acid, Venous  2.19 (*)    All other components within normal limits  I-STAT TROPONIN, ED - Abnormal; Notable for the following:    Troponin i, poc 1.06 (*)    All other components within normal limits  CULTURE, BLOOD (ROUTINE X 2)  CULTURE, BLOOD (ROUTINE X 2)  CULTURE, EXPECTORATED SPUTUM-ASSESSMENT  PROCALCITONIN  URINALYSIS, ROUTINE W REFLEX MICROSCOPIC  LACTIC ACID, PLASMA  TROPONIN I  TROPONIN I  TROPONIN I  BASIC METABOLIC PANEL  CBC  I-STAT CG4 LACTIC ACID, ED  POC OCCULT BLOOD, ED    EKG  EKG Interpretation  Date/Time:  Wednesday April 24 2017 15:54:19 EDT Ventricular Rate:  87 PR Interval:    QRS Duration: 99 QT Interval:  365 QTC Calculation: 440 R Axis:   15 Text Interpretation:  Sinus rhythm Ventricular premature complex Aberrant conduction of SV complex(es) since last tracing no significant change Confirmed by Daleen Bo [phone]) on 04/24/2017 4:28:35 PM       Radiology Dg Chest 2 View  Result Date: 04/24/2017 CLINICAL DATA:  Shortness of breath. EXAM: CHEST  2 VIEW COMPARISON:  Radiographs of Feb 15, 2017. FINDINGS: Stable cardiomegaly. No pneumothorax or significant pleural effusion is noted. Left lung is clear. Mild right basilar subsegmental atelectasis is noted. Bony thorax is unremarkable. IMPRESSION: Mild right basilar subsegmental atelectasis. Electronically Signed   By: Marijo Conception, M.D.   On: 04/24/2017 15:48    Procedures Procedures (including critical care time)  CRITICAL CARE Performed by: Frederica Kuster   Total critical care time: 30 minutes  Critical care time was exclusive of separately billable procedures and treating other patients.  Critical care was  necessary to treat or prevent imminent or life-threatening deterioration.  Critical care was time spent personally by me on the following activities: development of treatment plan with patient and/or surrogate as well as nursing, discussions with consultants, evaluation of patient's response to treatment, examination of patient, obtaining history from patient or surrogate, ordering and performing treatments and interventions, ordering and review of laboratory studies, ordering and review of radiographic studies, pulse oximetry and re-evaluation of patient's condition.  Sepsis - Repeat Assessment  Performed at:    [id number]  [facility]     Blood pressure 118/78, pulse 76, temperature (!) 101.5 F (38.6 C), temperature source Oral, resp. rate 16, SpO2 97 %.  Heart:     Regular rate and rhythm  Lungs:    Rales  Capillary Refill:   <2 sec  Peripheral Pulse:   Radial pulse palpable  Skin:     Normal Color     Medications Ordered in ED Medications  heparin injection 5,000 Units (not administered)  sodium chloride flush (NS) 0.9 % injection 3 mL (not administered)  acetaminophen (TYLENOL) tablet 650 mg (not administered)    Or  acetaminophen (TYLENOL) suppository 650 mg (not administered)  ondansetron (ZOFRAN) tablet 4 mg (not administered)    Or  ondansetron (ZOFRAN) injection 4 mg (not administered)  albuterol (PROVENTIL) (2.5 MG/3ML) 0.083% nebulizer solution 2.5 mg (not administered)  saccharomyces boulardii (FLORASTOR) capsule 250 mg (not administered)  sennosides-docusate sodium (SENOKOT-S) 8.6-50 MG tablet 2 tablet (not administered)  calcium acetate (PHOSLO) capsule 1,334 mg (not administered)  isosorbide mononitrate (IMDUR) 24 hr tablet 60 mg (not administered)  cinacalcet (SENSIPAR) tablet 60 mg (not administered)  lamoTRIgine (LAMICTAL) tablet 50 mg (not administered)  LORazepam (ATIVAN) tablet 0.5 mg (not administered)  multivitamin (RENA-VIT) tablet 1 tablet (not administered)    sertraline (ZOLOFT) tablet 50 mg (not administered)  valACYclovir (VALTREX) tablet 500 mg (not administered)  pantoprazole (PROTONIX) injection 40 mg (not administered)  vancomycin (VANCOCIN) IVPB 750 mg/150 ml premix (not administered)  ceFEPIme (MAXIPIME) 1 g in dextrose 5 % 50 mL IVPB (not administered)  vancomycin (VANCOCIN) 500 mg in sodium chloride 0.9 % 100 mL IVPB (not administered)  Darbepoetin Alfa (ARANESP) injection 200 mcg (not administered)  doxercalciferol (HECTOROL) injection 7 mcg (not administered)  sevelamer carbonate (RENVELA) tablet 1,600 mg (not administered)  acetaminophen (TYLENOL) tablet 1,000 mg (1,000 mg Oral Given 04/24/17 1559)  ceFEPIme (MAXIPIME) 2 g in dextrose 5 % 50 mL IVPB (0 g Intravenous Stopped 04/24/17 1839)  vancomycin (VANCOCIN) IVPB 1000 mg/200 mL premix (1,000 mg Intravenous New Bag/Given 04/24/17 1841)     Initial Impression / Assessment and Plan / ED Course  I have reviewed the triage vital signs and the nursing notes.  Pertinent labs & imaging results that were available during my care of the patient were reviewed by me and considered in my medical decision making (see chart for details).     Patient with previously treated pneumonia with Rocephin IM yesterday at [facility] care. Initial lactate was 2.19. Blood cultures pending. CMP shows sodium 134, chloride 96, BUN 59, creatinine 8.60, AST 96. CBC shows hemoglobin 9.8, platelets 81,000. Troponin 1.06. Suspect sepsis related, doubt ACS. No chest pain or shortness of breath.EKG shows NSR, PVCs, no significant change since last tracing. HCAP Antibiotics initiated in the ED. I consulted hospitalist, Dr. Lavella Lemons, who will admit the patient for observation and further evaluation and treatment. Patient also evaluated by Dr. Eulis Foster who guided the patient's management and agrees with plan.  Final Clinical Impressions(s) / ED Diagnoses   Final diagnoses:  HCAP (healthcare-associated pneumonia)    New  Prescriptions Current Discharge Medication List       Caryl Ada 04/24/17 Erasmo Downer, MD 04/25/17 1104

## 2017-04-24 NOTE — Progress Notes (Signed)
RN paged troponin of 1.64. Was 1 on admission. No chest pain. Attending attributed elevation to demand ischemia from sepsis and ESRD. Pt is presently chest pain free. Will continue to monitor and cycle troponins. Echo scheduled for tomorrow.  KJKG, NP [facility]

## 2017-04-24 NOTE — H&P (Addendum)
History and Physical    [name]:[id number] DOB: 02-Jan-1941 DOA: 04/24/2017  PCP: Hendricks Limes, MD   I have briefly reviewed patients previous medical reports in [facility].  Patient coming from: [facility]  Chief Complaint: Fever and cough.  HPI: [name] is a 76 year old female, SNF resident, ambulates with the help of a wheelchair/walker, PMH of ESRD on TTS HD, HTN, GERD, multiple myeloma (goes to [facility] every Wednesday for treatment), sent from HD due to above complaints. Patient and her family at bedside (daughter who is a Psychologist, sport and exercise and patient's brother) provided history. Patient has apparently been having chronic cough ongoing for the last 2 months with intermittent white sputum but no fever, chills or dyspnea. Since day prior to admission, patient started having chills, fevers but is unable to say if her cough got any worse. She denies headache, earache and reports some sore throat. No chest pain, palpitations. On day prior to admission, she also apparently sustained a fall on her bottom when she was trying to sit on her wheelchair but did not sustain any obvious injuries. She missed her dialysis yesterday due to not feeling well. She was confused and found on the floor. She had a large BM with some blood streaks. She was evaluated by NP at SNF, blood pressure was 193/105, temperature 100.2 and chest x-ray showed mild atelectatic changes/pneumonia at the right lower lobe. She was given a dose of Rocephin 1 g IM and breathing treatments. Patient stated that she felt better. This morning she went for her usual dialysis but shortly after being on dialysis for approximately an hour, she felt unwell, chills, fevers, nonbloody emesis, dialysis was stopped and patient was sent to the ED for further evaluation and management. Patient gives 2 days history of intermittent nonbloody emesis. She reported an episode of dark stools but rectal  exam in ED shows soft brown stools and FOBT is pending. She reports chronic? Coughing when she eats or drinks which has not changed.  ED Course: Temperature 103.2, mild tachypnea, normotensive, hemoglobin 9.8, platelets 81, creatinine 8.6, chest x-ray shows mild right basilar subsegmental atelectasis, lactate 2.19 and troponin 1.06.  Review of Systems:  All other systems reviewed and apart from HPI, are negative.  Past Medical History:  Diagnosis Date  . Closed fracture of right distal femur ([place]) 09/01/2015  . ESRD (end stage renal disease) ([place]) 03/11/2014  . GERD (gastroesophageal reflux disease) 04/23/2015  . Hypertension associatd with end stage renal disease on dialysis 03/11/2014  . Malnutrition of moderate degree 09/02/2015  . Multiple myeloma ([place]) 03/11/2014    Past Surgical History:  Procedure Laterality Date  . ABDOMINAL HYSTERECTOMY     Pt. denies  . I&D EXTREMITY Left 02/27/2016   Procedure: ARTHROSCOPIC IRRIGATION AND DEBRIDEMENT EXTREMITY;  Surgeon: Renette Butters, MD;  Location: [place];  Service: Orthopedics;  Laterality: Left;  . TEE WITHOUT CARDIOVERSION N/A 02/29/2016   Procedure: TRANSESOPHAGEAL ECHOCARDIOGRAM (TEE);  Surgeon: Thayer Headings, MD;  Location: [place];  Service: Cardiovascular;  Laterality: N/A;    Social History  reports that she has never smoked. She has never used smokeless tobacco. She reports that she does not drink alcohol or use drugs.  No Known Allergies  Family History  Problem Relation Age of Onset  . Hypertension Mother   . Hypertension Father      Prior to Admission medications   Medication Sig Start Date End Date Taking? Authorizing Provider  albuterol (  PROVENTIL) (2.5 MG/3ML) 0.083% nebulizer solution Take 3 mLs by nebulization. 04/24/17   [provider]  amLODipine (NORVASC) 10 MG tablet Take 10 mg by mouth daily. HOLD UNTIL AFTER DIALYSIS ON TUES/THURS/SAT AND HOLD FOR SYSTOLIC B/P READING OF 100 OR LESS    [provider]  bisacodyl (BISAC-EVAC) 10 MG suppository Place 10 mg rectally daily as needed for moderate constipation.    [provider]  calcium acetate (PHOSLO) 667 MG capsule Take 1,334 mg by mouth 3 (three) times daily.     [provider]  cefTRIAXone (ROCEPHIN) 1 g injection Inject 1 g into the muscle daily. For 7 days and may be reconstituted with Lidocaine 04/23/17 04/30/17  [provider]  cinacalcet (SENSIPAR) 60 MG tablet Take 60 mg by mouth daily.    [provider]  cloNIDine (CATAPRES) 0.1 MG tablet Take 0.1 mg by mouth every 8 (eight) hours as needed (for a systolic B/P of 180 or greater).     [provider]  DEXTROMETHORPHAN HBR PO Take 20 mg by mouth every 8 (eight) hours as needed (for cough).     [provider]  hydrocortisone cream 1 % Apply 1 application topically See admin instructions. APPLY TO RECTUM AS NEEDED FOR HEMORRHOIDS - q8h prn    [provider]  isosorbide mononitrate (IMDUR) 60 MG 24 hr tablet Take 60 mg by mouth daily.  03/24/15   [provider]  lamoTRIgine (LAMICTAL) 25 MG tablet Take 50 mg by mouth daily. Take 2 tablets to = 50 mg    [provider]  loperamide (IMODIUM A-D) 2 MG tablet Take 2 mg by mouth See admin instructions. AFTER EACH LOOSE STOOL/NOT TO EXCEED 6 TABLETS IN 24 HOURS FOR 2 DAYS (MAY ALSO TAKE 4 MG AS NEEDED PRE-DIALYSIS)    [provider]  LORazepam (ATIVAN) 0.5 MG tablet Take 1 tablet (0.5 mg total) by mouth at bedtime. 03/05/17   Eubanks, Jessica K, NP  multivitamin (RENA-VIT) TABS tablet Take 1 tablet by mouth daily.    [provider]  Nutritional Supplements (NEPRO) LIQD Take 270 mLs by mouth 2 (two) times daily.    [provider]  ondansetron (ZOFRAN ODT) 4 MG disintegrating tablet Take 1 tablet (4 mg total) by mouth every 8 (eight) hours as needed for nausea or vomiting. 01/21/15   Oni, Adeleke, MD  promethazine (PHENERGAN) 25  MG tablet Take 25 mg by mouth every 6 (six) hours as needed for nausea or vomiting.    [provider]  ranitidine (ZANTAC) 300 MG tablet Take 300 mg by mouth at bedtime. Reported on 03/05/2016    [provider]  sertraline (ZOLOFT) 50 MG tablet Take 50 mg by mouth daily.     [provider]  sevelamer carbonate (RENVELA) 800 MG tablet Take 1,600 mg by mouth 3 (three) times daily with meals.     [provider]  simethicone (MYLICON) 125 MG chewable tablet Take 1 tablet by mouth prior to each meal.    [provider]  Sodium Phosphates (RA SALINE ENEMA) 19-7 GM/118ML ENEM Place 1 enema rectally once as needed (if constipation not relieved by Dulcolax suppository). CALL DOCTOR IF NO RELIEF FROM THIS    [provider]  valACYclovir (VALTREX) 500 MG tablet Take 1 tablet (500 mg total) by mouth every other day. 03/01/16   ,  D, MD    Physical Exam: Vitals:   04/24/17 1715 04/24/17 1730 04/24/17 1745 04/24/17   1804  BP: 113/71 118/77 120/76   Pulse: 88 87 81   Resp:   18   Temp:    (!) 101.5 F (38.6 C)  TempSrc:    Oral  SpO2: 94% 97% 94%       Constitutional: Pleasant elderly female, moderately built and nourished, lying comfortably propped up in the gurney in the ED without distress. Does not look toxic. Eyes: PERTLA, lids and conjunctivae normal ENMT: Mucous membranes are dry. Posterior pharynx clear of any exudate or lesions. Neck: supple, no masses, no thyromegaly Respiratory: Reduced breath sounds in the bases with occasional basal crackles. Rest of lung fields clear to auscultation. No wheezing or rhonchi appreciated. No increased work of breathing. Cardiovascular: S1 & S2 heard, regular rate and rhythm, no murmurs / rubs / gallops. No extremity edema. 2+ pedal pulses. No carotid bruits.  Abdomen: No distension, no tenderness, no masses palpated. No hepatosplenomegaly. Bowel sounds normal.  Musculoskeletal: no clubbing /  cyanosis. No joint deformity upper and lower extremities. Good ROM, no contractures. Normal muscle tone.  Skin: no rashes, lesions, ulcers. No induration. No obvious injuries noted. Neurologic: CN 2-12 grossly intact. Sensation intact, DTR normal. Strength 5/5 in all 4 limbs.  Psychiatric: Normal judgment and insight. Alert and oriented x 3. Normal mood.     Labs on Admission: I have personally reviewed following labs and imaging studies  CBC:  Recent Labs Lab 04/23/17 04/24/17 1158  WBC 5.6 5.3  NEUTROABS 5 5.0  HGB 9.5* 9.8*  HCT 30* 30.2*  MCV  --  100.3*  PLT 119* 81*   Basic Metabolic Panel:  Recent Labs Lab 04/23/17 04/24/17 1158  NA 136* 134*  K 4.9 4.9  CL  --  96*  CO2  --  23  GLUCOSE  --  88  BUN 61* 59*  CREATININE 8.5* 8.60*  CALCIUM  --  8.7*   Liver Function Tests:  Recent Labs Lab 04/23/17 04/24/17 1158  AST 16 96*  ALT 8 26  ALKPHOS 117 112  BILITOT  --  0.9  PROT  --  7.6  ALBUMIN  --  3.2*   Coagulation Profile:  Recent Labs Lab 04/24/17 1158  INR 1.62      Radiological Exams on Admission: Dg Chest 2 View  Result Date: 04/24/2017 CLINICAL DATA:  Shortness of breath. EXAM: CHEST  2 VIEW COMPARISON:  Radiographs of Feb 15, 2017. FINDINGS: Stable cardiomegaly. No pneumothorax or significant pleural effusion is noted. Left lung is clear. Mild right basilar subsegmental atelectasis is noted. Bony thorax is unremarkable. IMPRESSION: Mild right basilar subsegmental atelectasis. Electronically Signed   By: James  Green Jr, M.D.   On: 04/24/2017 15:48    EKG: Independently reviewed. Sinus rhythm at 87 bpm, normal axis, no acute changes and QTC of 440 ms.  Assessment/Plan Principal Problem:   HCAP (healthcare-associated pneumonia) Active Problems:   ESRD (end stage renal disease) (HCC)   Multiple myeloma (HCC)   Anemia in neoplastic disease   GERD (gastroesophageal reflux disease)   Essential hypertension   Sepsis (HCC)   Nausea and  vomiting   Thrombocytopenia (HCC)     1. Suspected HCAP RLL: Follow-up blood cultures, sputum culture. Check pro calcitonin. Continue empirically started IV cefepime and vancomycin pending culture results. Speech therapy for swallow evaluation due to reported cough on by mouth intake. 2. Sepsis: Present on admission. Secondary to pneumonia but need to consider other causes i.e. bacteremia related to ESRD and dialysis. Due to ESRD   on dialysis, did not initiate aggressive IV fluids per sepsis protocol. Brief and gentle IV fluid hydration due to insensible loss from pyrexia and some GI losses. Follow outstanding culture results, trend lactate and continue IV antibiotics as above. 3. ESRD on HD: Nephrology to be consulted by EDP. No acute dialysis needs. Consider going back to her regular regimen of TTS HD in a.m. 4. Anemia: Secondary to chronic kidney disease, chronic disease and multiple myeloma. Follow CBCs across dialysis in a.m. Although some blood in stools reported yesterday at SNF, today's rectal exam shows normal colored stools without blood. Follow FOBT. 5. Thrombocytopenia: May be related to multiple myeloma. Follow CBCs closely. 6. Multiple myeloma: As per patient and family, follows up at [facility]. 7. Elevated troponins: No report of chest pain. EKG without acute changes. Likely due to demand ischemia from sepsis and from ESRD. Trend troponins. Check 2-D echo. 8. Nausea and vomiting:? Posttussive. When necessary antiemetics. IV PPI. Diet as tolerated. Monitor. 9. Essential hypertension: Resume home medications after reconciliation by pharmacy. 10. GERD: PPI.   DVT prophylaxis: Heparin subcutaneous  Code Status: Full  Family Communication: Discussed in detail with patient's daughter and brother at bedside. Updated care and answered questions.  Disposition Plan: DC to SNF when medically improved.  Consults called: EDP will call nephrology consultation.  Admission status:  Inpatient, telemetry.  Severity of Illness: The appropriate patient status for this patient is INPATIENT. Inpatient status is judged to be reasonable and necessary in order to provide the required intensity of service to ensure the patient's safety. The patient's presenting symptoms, physical exam findings, and initial radiographic and laboratory data in the context of their chronic comorbidities is felt to place them at high risk for further clinical deterioration. Furthermore, it is not anticipated that the patient will be medically stable for discharge from the hospital within 2 midnights of admission. The following factors support the patient status of inpatient.   " The patient's presenting symptoms include high fevers, chills, cough, nausea and vomiting. " The worrisome physical exam findings include febrile, mildly tachypneic and reduced breath sounds in the bases with few basal crackles. " The initial radiographic and laboratory data are worrisome because of elevated lactate, elevated troponin, chest x-ray suggesting right lower lobe opacity. " The chronic co-morbidities include ESRD on TTS HD, multiple myeloma.   * I certify that at the point of admission it is my clinical judgment that the patient will require inpatient hospital care spanning beyond 2 midnights from the point of admission due to high intensity of service, high risk for further deterioration and high frequency of surveillance required.*      , MD [facility] Pager [phone]  If 7PM-7AM, please contact night-coverage [email or web address] Password [email or web address]  04/24/2017, 6:12 PM    

## 2017-04-24 NOTE — Consult Note (Signed)
Reason for Consult:ESRD, fever, anemia, HPTH Referring Physician: Dr. Wanda Plump [name] is an 76 y.o. female.  HPI: 76 yr old female, with hx ESRD from [facility] on HD since 11/15.  Hx HTN, anemia , thrombocytopenia, HPTH, recurrent cough.  Gets HD TTS and Chemotx q Wed.  Missed HD Tues and <75mn today.  Missed chemo on Wed also.  [facility].  At HD, shaking chills so sent to ED.  No low bps, change mentation.  Has chronic cough, but some ^ recent and sneezing.  No HA, rash, sputum, CP.  Threw [facility] x 2 in past 24 h, [facility]  Up over 2 Kg at HD today.  Constitutional: as above, other than cough, and fever no c/o Eyes: negative Ears, nose, mouth, throat, and face: sneezing Respiratory: as above Cardiovascular: negative Gastrointestinal: as above Genitourinary:negative Integument/breast: negative Hematologic/lymphatic: see HPI Musculoskeletal:negative Neurological: negative Allergic/Immunologic: negative  [facility] [place] [facility] [place] [facility]  64.5 kg. HD Bath 2k, 3 Ca, Dialyzer 180NR, Heparin strd. Access LUA AVF.  Past Medical History:  Diagnosis Date  . [place] right distal [place] 09[place]  . [place] ([place] stage renal disease) [place] 03/11[place]  . GERD (gastroesophageal reflux disease) 04/23/2015  . Hypertension associatd with end stage renal disease on dialysis 03/11/2014  . Malnutrition of moderate degree 09/02/2015  . Multiple myeloma ([place] 03/11/2014    Past Surgical History:  Procedure Laterality Date  . ABDOMINAL HYSTERECTOMY     Pt. denies  . I&D EXTREMITY Left 02/27/2016   Procedure: ARTHROSCOPIC IRRIGATION AND DEBRIDEMENT EXTREMITY;  Surgeon: TRenette Butters MD;  [facility]  Service: Orthopedics;  Laterality: Left;  . TEE WITHOUT CARDIOVERSION N/A 02/29/2016   Procedure: TRANSESOPHAGEAL ECHOCARDIOGRAM (TEE);  Surgeon: PThayer Headings MD;  [facility];   Service: Cardiovascular;  Laterality: N/A;    Family History  Problem Relation Age of Onset  . Hypertension Mother   . Hypertension Father     Social History:  reports that she has never smoked. She has never used smokeless tobacco. She reports that she does not drink alcohol or use drugs.  Allergies: No Known Allergies  Medications:  I have reviewed the patient's current medications. Prior to Admission:  Prescriptions Prior to Admission  Medication Sig Dispense Refill Last Dose  . acetaminophen (TYLENOL) 325 MG tablet Take 650 mg by mouth every 6 (six) hours as needed for fever (FOR 2 WEEKS).   PRN at PRN  . albuterol (PROVENTIL) (2.5 MG/3ML) 0.083% nebulizer solution Take 3 mLs by nebulization 3 (three) times daily. FOR 3 DAYS STARTING ON 04/23/17 THEN EVERY 6 HOURS FOR 2 WEEKS AS NEEDED FOR SHORTNESS OF BREATH OR WHEEZING   04/24/2017 at 0600  . amLODipine (NORVASC) 10 MG tablet Take 10 mg by mouth See admin instructions. HOLD UNTIL AFTER DIALYSIS ON TUES/THURS/SAT AND HOLD FOR SYSTOLIC B/P READING OF 1672OR LESS   04/24/2017 at 0800  . bisacodyl (BISAC-EVAC) 10 MG suppository Place 10 mg rectally once as needed (FOR CONSTIPATION NOT RELIEVED BY MILK OF MAGNESIA).    PRN at PRN  . calcium acetate (PHOSLO) 667 MG capsule Take 1,334 mg by mouth 3 (three) times daily.    04/24/2017 at 0800  . cefTRIAXone (ROCEPHIN) 1 g injection Inject 1 g into the muscle daily. For 7 days and may be reconstituted with Lidocaine   04/23/2017  at 1400  . cinacalcet (SENSIPAR) 60 MG tablet Take 60 mg by mouth daily.   04/24/2017 at 0800  . cloNIDine (CATAPRES) 0.1 MG tablet Take 0.1 mg by mouth every 8 (eight) hours as needed (for a systolic B/P of 244 or greater).    PRN at PRN  . DEXTROMETHORPHAN HBR PO Take 20 mg by mouth every 8 (eight) hours as needed (for cough).    PRN at PRN  . hydrocortisone cream 1 % Apply 1 application topically daily as needed (for hemorrhoids).    PRN at PRN  . isosorbide mononitrate  (IMDUR) 60 MG 24 hr tablet Take 60 mg by mouth daily.    04/24/2017 at 0800  . lamoTRIgine (LAMICTAL) 25 MG tablet Take 50 mg by mouth daily.    04/24/2017 at 0800  . loperamide (IMODIUM A-D) 2 MG tablet Take 2 mg by mouth See admin instructions. AFTER EACH LOOSE STOOL/NOT TO EXCEED 6 TABLETS IN 24 HOURS FOR 2 DAYS (MAY ALSO TAKE 4 MG AS NEEDED PRE-DIALYSIS)   PRN at PRN  . LORazepam (ATIVAN) 0.5 MG tablet Take 1 tablet (0.5 mg total) by mouth at bedtime. 30 tablet 0 04/23/2017 at 2000  . magnesium hydroxide (MILK OF MAGNESIA) 400 MG/5ML suspension Take 15-30 mLs by mouth once as needed for mild constipation.   PRN at PRN  . multivitamin (RENA-VIT) TABS tablet Take 1 tablet by mouth daily.   04/24/2017 at 0800  . ondansetron (ZOFRAN ODT) 4 MG disintegrating tablet Take 1 tablet (4 mg total) by mouth every 8 (eight) hours as needed for nausea or vomiting. 12 tablet 0 PRN at PRN  . promethazine (PHENERGAN) 25 MG tablet Take 25 mg by mouth every 6 (six) hours as needed for nausea or vomiting.   PRN at PRN  . ranitidine (ZANTAC) 300 MG tablet Take 300 mg by mouth at bedtime. Reported on 03/05/2016   04/23/2017 at 2000  . saccharomyces boulardii (FLORASTOR) 250 MG capsule Take 250 mg by mouth 2 (two) times daily. For 10 days   04/24/2017 at 1000  . sennosides-docusate sodium (SENOKOT-S) 8.6-50 MG tablet Take 2 tablets by mouth at bedtime.   04/23/2017 at 2000  . sertraline (ZOLOFT) 50 MG tablet Take 50 mg by mouth daily.    04/24/2017 at 0800  . sevelamer carbonate (RENVELA) 800 MG tablet Take 1,600 mg by mouth 3 (three) times daily with meals.    04/24/2017 at 0800  . simethicone (MYLICON) 010 MG chewable tablet Take 1 tablet by mouth prior to each meal.   04/24/2017 at 0800  . Sodium Phosphates (RA SALINE ENEMA) 19-7 GM/118ML ENEM Place 1 enema rectally once as needed (if constipation not relieved by Dulcolax suppository). CALL DOCTOR IF NO RELIEF FROM THIS   PRN at PRN  . UNABLE TO FIND Nephro supplement: Drink by  mouth two times a day (may D/C if patient is consistently refusing)   04/24/2017 at 1000  . valACYclovir (VALTREX) 500 MG tablet Take 1 tablet (500 mg total) by mouth every other day.   04/24/2017 at 0800    Hectorol 7 mcg iv tiw,. , Aranesp 25mg iv q wk Parsabiv 5 mcg iv tiw   Results for orders placed or performed during the hospital encounter of 04/24/17 (from the past 48 hour(s))  Comprehensive metabolic panel     Status: Abnormal   Collection Time: 04/24/17 11:58 AM  Result Value Ref Range   Sodium 134 (L) 135 - 145 mmol/L   Potassium 4.9  3.5 - 5.1 mmol/L   Chloride 96 (L) 101 - 111 mmol/L   CO2 23 22 - 32 mmol/L   Glucose, Bld 88 65 - 99 mg/dL   BUN 59 (H) 6 - 20 mg/dL   Creatinine, Ser 8.60 (H) 0.44 - 1.00 mg/dL   Calcium 8.7 (L) 8.9 - 10.3 mg/dL   Total Protein 7.6 6.5 - 8.1 g/dL   Albumin 3.2 (L) 3.5 - 5.0 g/dL   AST 96 (H) 15 - 41 U/L   ALT 26 14 - 54 U/L   Alkaline Phosphatase 112 38 - 126 U/L   Total Bilirubin 0.9 0.3 - 1.2 mg/dL   GFR calc non Af Amer 4 (L) >60 mL/min   GFR calc Af Amer 5 (L) >60 mL/min    Comment: (NOTE) The eGFR has been calculated using the CKD EPI equation. This calculation has not been validated in all clinical situations. eGFR's persistently <60 mL/min signify possible Chronic Kidney Disease.    Anion gap 15 5 - 15  CBC with Differential     Status: Abnormal   Collection Time: 04/24/17 11:58 AM  Result Value Ref Range   WBC 5.3 4.0 - 10.5 K/uL   RBC 3.01 (L) 3.87 - 5.11 MIL/uL   Hemoglobin 9.8 (L) 12.0 - 15.0 g/dL   HCT 30.2 (L) 36.0 - 46.0 %   MCV 100.3 (H) 78.0 - 100.0 fL   MCH 32.6 26.0 - 34.0 pg   MCHC 32.5 30.0 - 36.0 g/dL   RDW 18.5 (H) 11.5 - 15.5 %   Platelets 81 (L) 150 - 400 K/uL    Comment: REPEATED TO VERIFY PLATELET COUNT CONFIRMED BY SMEAR    Neutrophils Relative % 94 %   Lymphocytes Relative 4 %   Monocytes Relative 2 %   Eosinophils Relative 0 %   Basophils Relative 0 %   Neutro Abs 5.0 1.7 - 7.7 K/uL   Lymphs  Abs 0.2 (L) 0.7 - 4.0 K/uL   Monocytes Absolute 0.1 0.1 - 1.0 K/uL   Eosinophils Absolute 0.0 0.0 - 0.7 K/uL   Basophils Absolute 0.0 0.0 - 0.1 K/uL   WBC Morphology MILD LEFT SHIFT (1-5% METAS, OCC MYELO, OCC BANDS)   Protime-INR     Status: Abnormal   Collection Time: 04/24/17 11:58 AM  Result Value Ref Range   Prothrombin Time 19.4 (H) 11.4 - 15.2 seconds   INR 1.62   Procalcitonin - Baseline     Status: None   Collection Time: 04/24/17 11:58 AM  Result Value Ref Range   Procalcitonin >150.00 ng/mL    Comment:        Interpretation: PCT >= 10 ng/mL: Important systemic inflammatory response, almost exclusively due to severe bacterial sepsis or septic shock. (NOTE)         ICU PCT Algorithm               Non ICU PCT Algorithm    ----------------------------     ------------------------------         PCT < 0.25 ng/mL                 PCT < 0.1 ng/mL     Stopping of antibiotics            Stopping of antibiotics       strongly encouraged.               strongly encouraged.    ----------------------------     ------------------------------  PCT level decrease by               PCT < 0.25 ng/mL       >= 80% from peak PCT       OR PCT 0.25 - 0.5 ng/mL          Stopping of antibiotics                                             encouraged.     Stopping of antibiotics           encouraged.    ----------------------------     ------------------------------       PCT level decrease by              PCT >= 0.25 ng/mL       < 80% from peak PCT        AND PCT >= 0.5 ng/mL             Continuing antibiotics                                              encouraged.       Continuing antibiotics            encouraged.    ----------------------------     ------------------------------     PCT level increase compared          PCT > 0.5 ng/mL         with peak PCT AND          PCT >= 0.5 ng/mL             Escalation of antibiotics                                          strongly  encouraged.      Escalation of antibiotics        strongly encouraged.   I-Stat CG4 Lactic Acid, ED     Status: Abnormal   Collection Time: 04/24/17  3:20 PM  Result Value Ref Range   Lactic Acid, Venous 2.19 (HH) 0.5 - 1.9 mmol/L   Comment NOTIFIED PHYSICIAN   I-stat troponin, ED (not at [facility])     Status: Abnormal   Collection Time: 04/24/17  3:36 PM  Result Value Ref Range   Troponin i, poc 1.06 (HH) 0.00 - 0.08 ng/mL   Comment NOTIFIED PHYSICIAN    Comment 3            Comment: Due to the release kinetics of cTnI, a negative result within the first hours of the onset of symptoms does not rule out myocardial infarction with certainty. If myocardial infarction is still suspected, repeat the test at appropriate intervals.   POC occult blood, ED Provider will collect     Status: None   Collection Time: 04/24/17  6:09 PM  Result Value Ref Range   Fecal Occult Bld NEGATIVE NEGATIVE  I-Stat CG4 Lactic Acid, ED     Status: None   Collection Time: 04/24/17  6:10 PM  Result Value Ref Range   Lactic Acid, Venous 1.31 0.5 -  1.9 mmol/L    Dg Chest 2 View  Result Date: 04/24/2017 CLINICAL DATA:  Shortness of breath. EXAM: CHEST  2 VIEW COMPARISON:  Radiographs of Feb 15, 2017. FINDINGS: Stable cardiomegaly. No pneumothorax or significant pleural effusion is noted. Left lung is clear. Mild right basilar subsegmental atelectasis is noted. Bony thorax is unremarkable. IMPRESSION: Mild right basilar subsegmental atelectasis. Electronically Signed   By: Marijo Conception, M.D.   On: 04/24/2017 15:48    ROS Blood pressure 118/78, pulse 76, temperature (!) 101.5 F (38.6 C), temperature source Oral, resp. rate 16, SpO2 97 %. Physical Exam Physical Examination: General appearance - alert, NAD Mental status - alert, oriented to person, place, and time Eyes - catarracts bilat Mouthedentulous, upper plate onlyedentulous, upper plate only Neck - adenopathy noted PCL Lymphatics - posterior  cervical nodes Chest - occ Wheeze, no R, orR Heart - S1 and S2 normal, systolic murmur DV7/6 at 2nd left intercostal space Abdomen - soft, nontender, liver down 3 cm Musculoskeletal - no joint tenderness, deformity or swelling Extremities - LUA AVF ,mild Larm swelling Skin - normal coloration and turgor, no rashes, no suspicious skin lesions noted  Assessment/Plan: 1 Fever not clear etio.  CXR not C/W pneu, has mild vol xs.  ??viral 2 ESRD: will plan HD in am 3 Hypertension: bp low hold meds 4. Anemia of ESRD: use esa 5. Metabolic Bone Disease: use Parsabiv and Hectoral 6 MM immunocomp,  7 Thrombocytopenia P HD in am, cultures, AB, consider CMV and EBV serologies  [name] L 04/24/2017, 8:22 PM

## 2017-04-25 ENCOUNTER — Ambulatory Visit (HOSPITAL_COMMUNITY): Payer: Medicare Other

## 2017-04-25 DIAGNOSIS — J189 Pneumonia, unspecified organism: Secondary | ICD-10-CM

## 2017-04-25 DIAGNOSIS — C9000 Multiple myeloma not having achieved remission: Secondary | ICD-10-CM

## 2017-04-25 DIAGNOSIS — D630 Anemia in neoplastic disease: Secondary | ICD-10-CM

## 2017-04-25 DIAGNOSIS — A419 Sepsis, unspecified organism: Principal | ICD-10-CM

## 2017-04-25 DIAGNOSIS — K219 Gastro-esophageal reflux disease without esophagitis: Secondary | ICD-10-CM

## 2017-04-25 DIAGNOSIS — R112 Nausea with vomiting, unspecified: Secondary | ICD-10-CM

## 2017-04-25 DIAGNOSIS — D696 Thrombocytopenia, unspecified: Secondary | ICD-10-CM

## 2017-04-25 DIAGNOSIS — N186 End stage renal disease: Secondary | ICD-10-CM

## 2017-04-25 DIAGNOSIS — I10 Essential (primary) hypertension: Secondary | ICD-10-CM

## 2017-04-25 LAB — BASIC METABOLIC PANEL
Anion gap: 14 (ref 5–15)
BUN: 72 mg/dL — AB (ref 6–20)
CALCIUM: 7.5 mg/dL — AB (ref 8.9–10.3)
CO2: 21 mmol/L — ABNORMAL LOW (ref 22–32)
CREATININE: 9.82 mg/dL — AB (ref 0.44–1.00)
Chloride: 98 mmol/L — ABNORMAL LOW (ref 101–111)
GFR calc Af Amer: 4 mL/min — ABNORMAL LOW (ref >60)
GFR, EST NON AFRICAN AMERICAN: 3 mL/min — AB (ref >60)
Glucose, Bld: 72 mg/dL (ref 65–99)
POTASSIUM: 5.8 mmol/L — AB (ref 3.5–5.1)
SODIUM: 133 mmol/L — AB (ref 135–145)

## 2017-04-25 LAB — CBC
HCT: 29.2 % — ABNORMAL LOW (ref 36.0–46.0)
Hemoglobin: 9.2 g/dL — ABNORMAL LOW (ref 12.0–15.0)
MCH: 31.9 pg (ref 26.0–34.0)
MCHC: 31.5 g/dL (ref 30.0–36.0)
MCV: 101.4 fL — ABNORMAL HIGH (ref 78.0–100.0)
PLATELETS: 72 10*3/uL — AB (ref 150–400)
RBC: 2.88 MIL/uL — AB (ref 3.87–5.11)
RDW: 18.8 % — AB (ref 11.5–15.5)
WBC: 8.6 10*3/uL (ref 4.0–10.5)

## 2017-04-25 LAB — ECHOCARDIOGRAM COMPLETE
HEIGHTINCHES: 67 in
WEIGHTICAEL: 2316.8 [oz_av]

## 2017-04-25 LAB — TROPONIN I
Troponin I: 0.97 ng/mL (ref <0.03)
Troponin I: 0.62 ng/mL (ref <0.03)

## 2017-04-25 LAB — MRSA PCR SCREENING: MRSA by PCR: POSITIVE — AB

## 2017-04-25 MED ORDER — PENTAFLUOROPROP-TETRAFLUOROETH EX AERO: 1.0000 "application " | INHALATION_SPRAY | CUTANEOUS | Status: DC | PRN

## 2017-04-25 MED ORDER — MUPIROCIN 2 % EX OINT
1.0000 "application " | TOPICAL_OINTMENT | Freq: Two times a day (BID) | CUTANEOUS | Status: DC
  Administered 2017-04-25 – 2017-04-26 (×2): 1 via NASAL
  Filled 2017-04-25 (×2): qty 22

## 2017-04-25 MED ORDER — HEPARIN SODIUM (PORCINE) 1000 UNIT/ML DIALYSIS
100.0000 [IU]/kg | INTRAMUSCULAR | Status: DC | PRN
  Filled 2017-04-25: qty 7

## 2017-04-25 MED ORDER — ETELCALCETIDE HCL 5 MG/ML IV SOLN: 5.0000 mg | INTRAVENOUS | Status: DC

## 2017-04-25 MED ORDER — SIMETHICONE 80 MG PO CHEW
80.0000 mg | CHEWABLE_TABLET | Freq: Three times a day (TID) | ORAL | Status: DC
  Administered 2017-04-25 – 2017-04-26 (×4): 80 mg via ORAL
  Filled 2017-04-25 (×4): qty 1

## 2017-04-25 MED ORDER — LIDOCAINE-PRILOCAINE 2.5-2.5 % EX CREA: 1.0000 "application " | TOPICAL_CREAM | CUTANEOUS | Status: DC | PRN

## 2017-04-25 MED ORDER — SIMETHICONE 80 MG PO CHEW
80.0000 mg | CHEWABLE_TABLET | Freq: Four times a day (QID) | ORAL | Status: DC | PRN
  Administered 2017-04-25: 80 mg via ORAL
  Filled 2017-04-25: qty 1

## 2017-04-25 MED ORDER — VANCOMYCIN HCL IN DEXTROSE 750-5 MG/150ML-% IV SOLN
INTRAVENOUS | Status: AC
  Administered 2017-04-25: 750 mg via INTRAVENOUS
  Filled 2017-04-25: qty 150

## 2017-04-25 MED ORDER — LIDOCAINE HCL (PF) 1 % IJ SOLN: 5.0000 mL | INTRAMUSCULAR | Status: DC | PRN

## 2017-04-25 MED ORDER — SODIUM CHLORIDE 0.9 % IV SOLN: 100.0000 mL | INTRAVENOUS | Status: DC | PRN

## 2017-04-25 MED ORDER — DOXERCALCIFEROL 4 MCG/2ML IV SOLN
INTRAVENOUS | Status: AC
  Administered 2017-04-25: 7 ug via INTRAVENOUS
  Filled 2017-04-25: qty 4

## 2017-04-25 MED ORDER — HEPARIN SODIUM (PORCINE) 1000 UNIT/ML DIALYSIS: 1000.0000 [IU] | INTRAMUSCULAR | Status: DC | PRN

## 2017-04-25 MED ORDER — ALTEPLASE 2 MG IJ SOLR: 2.0000 mg | Freq: Once | INTRAMUSCULAR | Status: DC | PRN

## 2017-04-25 MED ORDER — DARBEPOETIN ALFA 200 MCG/0.4ML IJ SOSY
PREFILLED_SYRINGE | INTRAMUSCULAR | Status: AC
  Administered 2017-04-25: 200 ug via INTRAVENOUS
  Filled 2017-04-25: qty 0.4

## 2017-04-25 MED ORDER — CHLORHEXIDINE GLUCONATE CLOTH 2 % EX PADS
6.0000 | MEDICATED_PAD | Freq: Every day | CUTANEOUS | Status: DC
  Administered 2017-04-25: 6 via TOPICAL

## 2017-04-25 NOTE — Evaluation (Signed)
Physical Therapy Evaluation Patient Details Name: [name] MRN: [id number] DOB: 02-20-1941 Today's Date: 04/25/2017   History of Present Illness  76 yo female with onset of sepsis from HCAP, demand ischemia with elevated troponin, thrombocytopenia, and Contact precautions from HCAP.   Pt was a SNF resident at baseline, has many conditions PMHx:  ESRD, HTN, HD, mult myeloma,   Clinical Impression  Pt was in bed and able to assist with transition to her chair.  RW is her previous level of function, was apparently able to mobilize with minor help at SNF prior to return to hosp.  Her plan is to follow acutely to return to SNF with strengthening and balance/gait with RW as tolerated.  Pt in agreement with plan, motivated as she wants to return to her own home.      Follow Up Recommendations SNF    Equipment Recommendations  None recommended by PT    Recommendations for Other Services       Precautions / Restrictions Precautions Precautions: Fall (telemetry) Restrictions Weight Bearing Restrictions: No      Mobility  Bed Mobility Overal bed mobility: Needs Assistance Bed Mobility: Supine to Sit     Supine to sit: Min assist     General bed mobility comments: minimal assist to lift up trunk and pivot to side of bed  Transfers Overall transfer level: Needs assistance Equipment used: Rolling walker (2 wheeled);1 person hand held assist Transfers: Sit to/from Stand Sit to Stand: Mod assist         General transfer comment: significant assistance to power up and then insecure on her feet, has residual weakness from RLE fracture of knee  Ambulation/Gait Ambulation/Gait assistance: Min assist Ambulation Distance (Feet): 4 Feet Assistive device: Rolling walker (2 wheeled);1 person hand held assist Gait Pattern/deviations: Step-to pattern;Decreased stride length;Wide base of support;Trunk flexed;Shuffle Gait velocity: reduced Gait velocity interpretation: Below normal speed  for age/gender General Gait Details: slow transition with short steps, more insecure to step backward to chair and not sure where she was to line up wihtout cues  Stairs            Wheelchair Mobility    Modified Rankin (Stroke Patients Only)       Balance                                             Pertinent Vitals/Pain Pain Assessment: No/denies pain    Home Living Family/patient expects to be discharged to:: Skilled nursing facility                 Additional Comments: [facility]    Prior Function Level of Independence: Needs assistance   Gait / Transfers Assistance Needed: RW with minor assistance for gait, mod I on RW for transfers  ADL's / Homemaking Assistance Needed: in SNF for care but reports she could bathe herself, nsg with meds        Hand Dominance        Extremity/Trunk Assessment   Upper Extremity Assessment Upper Extremity Assessment: Overall WFL for tasks assessed    Lower Extremity Assessment Lower Extremity Assessment: Generalized weakness    Cervical / Trunk Assessment Cervical / Trunk Assessment: Kyphotic  Communication   Communication: No difficulties  Cognition   Behavior During Therapy: WFL for tasks assessed/performed Overall Cognitive Status: Within Functional Limits for tasks assessed  General Comments      Exercises     Assessment/Plan    PT Assessment Patient needs continued PT services  PT Problem List Decreased strength;Decreased range of motion;Decreased activity tolerance;Decreased balance;Decreased mobility;Decreased coordination;Decreased safety awareness;Decreased skin integrity       PT Treatment Interventions DME instruction;Gait training;Functional mobility training;Therapeutic activities;Therapeutic exercise;Balance training;Neuromuscular re-education;Patient/family education    PT Goals (Current goals can be found in the  Care Plan section)  Acute Rehab PT Goals Patient Stated Goal: to feel stronger and get back to her own home PT Goal Formulation: With patient Time For Goal Achievement: 05/09/17 Potential to Achieve Goals: Good    Frequency Min 2X/week   Barriers to discharge  (has SNF to return )      Co-evaluation               AM-PAC PT "6 Clicks" Daily Activity  Outcome Measure Difficulty turning over in bed (including adjusting bedclothes, sheets and blankets)?: Total Difficulty moving from lying on back to sitting on the side of the bed? : Total Difficulty sitting down on and standing up from a chair with arms (e.g., wheelchair, bedside commode, etc,.)?: Total Help needed moving to and from a bed to chair (including a wheelchair)?: A Little Help needed walking in hospital room?: A Little Help needed climbing 3-5 steps with a railing? : A Lot 6 Click Score: 11    End of Session Equipment Utilized During Treatment: Gait belt Activity Tolerance: Patient limited by fatigue;Other (comment) (RLE weakness from old injury per pt, along with BLE weakness) Patient left: in chair;with call bell/phone within reach;with chair alarm set Nurse Communication: Mobility status PT Visit Diagnosis: Unsteadiness on feet (R26.81);Muscle weakness (generalized) (M62.81);Difficulty in walking, not elsewhere classified (R26.2)    Time: 0272-5366 PT Time Calculation (min) (ACUTE ONLY): 25 min   Charges:   PT Evaluation $PT Eval Moderate Complexity: 1 Procedure PT Treatments $Gait Training: 8-22 mins   PT G Codes:   PT G-Codes **NOT FOR INPATIENT CLASS** Functional Assessment Tool Used: AM-PAC 6 Clicks Basic Mobility    Ramond Dial 04/25/2017, 11:36 AM   Mee Hives, PT MS Acute Rehab Dept. Number: [place] [facility] [place]

## 2017-04-25 NOTE — Progress Notes (Signed)
PROGRESS NOTE    [name]  TGY:[id number] DOB: November 01, 1940 DOA: 04/24/2017 PCP: Hendricks Limes, MD   Chief Complaint  Patient presents with  . Fever    Brief Narrative:  HPI on 04/24/2017 by Dr. Vernell Leep [name] is a 76 year old female, SNF resident, ambulates with the help of a wheelchair/walker, PMH of ESRD on TTS HD, HTN, GERD, multiple myeloma (goes to [facility] every Wednesday for treatment), sent from HD due to above complaints. Patient and her family at bedside (daughter who is a Psychologist, sport and exercise and patient's brother) provided history. Patient has apparently been having chronic cough ongoing for the last 2 months with intermittent white sputum but no fever, chills or dyspnea. Since day prior to admission, patient started having chills, fevers but is unable to say if her cough got any worse. She denies headache, earache and reports some sore throat. No chest pain, palpitations. On day prior to admission, she also apparently sustained a fall on her bottom when she was trying to sit on her wheelchair but did not sustain any obvious injuries. She missed her dialysis yesterday due to not feeling well. She was confused and found on the floor. She had a large BM with some blood streaks. She was evaluated by NP at SNF, blood pressure was 193/105, temperature 100.2 and chest x-ray showed mild atelectatic changes/pneumonia at the right lower lobe. She was given a dose of Rocephin 1 g IM and breathing treatments. Patient stated that she felt better. This morning she went for her usual dialysis but shortly after being on dialysis for approximately an hour, she felt unwell, chills, fevers, nonbloody emesis, dialysis was stopped and patient was sent to the ED for further evaluation and management. Patient gives 2 days history of intermittent nonbloody emesis. She reported an episode of dark stools but rectal exam in ED shows soft brown stools and FOBT is pending. She reports chronic?  Coughing when she eats or drinks which has not changed. Assessment & Plan   Sepsis secondary to suspected HCAP -Patient presented with fever, tachypnea -Chest x-ray mild right basilar segmental atelectasis -Currently on vancomycin and cefepime -Blood cultures show no growth to date -Pro calcitonin greater than 150, lactic acid 2.19 on admission -Lactic acid currently trending downward and within normal range  End-stage renal disease -On hemodialysis, Tuesday, Thursday, Saturday -Patient Mr. Tuesday dialysis treatment, presented to dialysis on Wednesday, 04/24/2017 however only completed 2 hours she started to feel bad. -Nephrology consulted and appreciated  Elevated troponin -Patient currently chest pain-free -Suspect secondary to demand ischemia from sepsis and end-stage renal disease -Troponin peaked at 1.64, currently trending downward -Echocardiogram pending  Anemia of chronic disease -Hemoglobin was 11 and April 2018 however prior to that baseline appears to be 9-10 -Currently hemoglobin 9.2 -Continue to monitor CBC  Thrombocytopenia -Likely related to multiple myeloma -Platelets currently 72, continue to monitor CBC  Multiple myeloma -Patient follows at [facility] -Possibly related to her cough -Continue antiemetics as needed  GERD -Continue PPI  Hyperkalemia -Suspect will correct with hemodialysis today, continue to monitor BMP  Depression/anxiety -Continue Zoloft, Ativan  Essential hypertension -On amlodipine daily, however after HD on TTS if systolic BP >287 -Stable currently, will continue to monitor   DVT Prophylaxis  heparin  Code Status: Full  Family Communication: None at bedside  Disposition Plan: Admitted. Discharge to SNF when stable.   Consultants Nephrology   Procedures  none  Antibiotics   Anti-infectives  Start     Dose/Rate Route Frequency Ordered Stop   04/26/17 2200  valACYclovir (VALTREX) tablet 500  mg     500 mg Oral Every 48 hours 04/24/17 1930     04/25/17 1800  ceFEPIme (MAXIPIME) 1 g in dextrose 5 % 50 mL IVPB     1 g 100 mL/hr over 30 Minutes Intravenous Every 24 hours 04/24/17 1940     04/25/17 1200  vancomycin (VANCOCIN) IVPB 750 mg/150 ml premix     750 mg 150 mL/hr over 60 Minutes Intravenous Every T-Th-Sa (Hemodialysis) 04/24/17 1939     04/24/17 2000  vancomycin (VANCOCIN) 500 mg in sodium chloride 0.9 % 100 mL IVPB     500 mg 100 mL/hr over 60 Minutes Intravenous  Once 04/24/17 1956 04/24/17 2230   04/24/17 1730  ceFEPIme (MAXIPIME) 2 g in dextrose 5 % 50 mL IVPB     2 g 100 mL/hr over 30 Minutes Intravenous  Once 04/24/17 1727 04/24/17 1839   04/24/17 1730  vancomycin (VANCOCIN) IVPB 1000 mg/200 mL premix     1,000 mg 200 mL/hr over 60 Minutes Intravenous  Once 04/24/17 1727 04/24/17 2039   04/24/17 1700  levofloxacin (LEVAQUIN) tablet 750 mg  Status:  Discontinued     750 mg Oral  Once 04/24/17 1658 04/24/17 1726      Subjective:   [name] seen and examined today.  Patient feeling better but not back to her "normal self." She continues to have cough and complains of pain when she coughs. Denies further shortness of breath. Denies further nausea/vomiting. Able to eat and tolerate diet. Denies chest pain, abdominal pain, headache, dizziness.   Objective:   Vitals:   04/24/17 1930 04/24/17 2030 04/25/17 0415 04/25/17 0845  BP: 118/78 108/77 127/82 135/87  Pulse: 76 71 60 64  Resp: '16 19 20 19  ' Temp:  99.8 F (37.7 C) 98.2 F (36.8 C) 98.5 F (36.9 C)  TempSrc:  Oral Oral Oral  SpO2: 97% 98% 98% 96%  Weight:  65.7 kg (144 lb 12.8 oz)    Height:  '5\' 7"'  (1.702 m)      Intake/Output Summary (Last 24 hours) at 04/25/17 1337 Last data filed at 04/25/17 1000  Gross per 24 hour  Intake              390 ml  Output              100 ml  Net              290 ml   Filed Weights   04/24/17 2030  Weight: 65.7 kg (144 lb 12.8 oz)    Exam  General: Well  developed, well nourished, NAD, appears stated age  HEENT: NCAT,  mucous membranes moist.   Cardiovascular: S1 S2 auscultated, 1/6 SEM, RRR  Respiratory: Diminished breath sounds, moreso in the bases, otherwise clear  Abdomen: Soft, nontender, nondistended, + bowel sounds  Extremities: warm dry without cyanosis clubbing or edema  Neuro: AAOx3, nonfocal  Psych: Normal affect and demeanor with intact judgement and insight   Data Reviewed: I have personally reviewed following labs and imaging studies  CBC:  Recent Labs Lab 04/23/17 04/24/17 1158 04/25/17 0730  WBC 5.6 5.3 8.6  NEUTROABS 5 5.0  --   HGB 9.5* 9.8* 9.2*  HCT 30* 30.2* 29.2*  MCV  --  100.3* 101.4*  PLT 119* 81* 72*   Basic Metabolic Panel:  Recent Labs Lab 04/23/17 04/24/17 1158 04/25/17  0730  NA 136* 134* 133*  K 4.9 4.9 5.8*  CL  --  96* 98*  CO2  --  23 21*  GLUCOSE  --  88 72  BUN 61* 59* 72*  CREATININE 8.5* 8.60* 9.82*  CALCIUM  --  8.7* 7.5*   GFR: Estimated Creatinine Clearance: 4.6 mL/min (A) (by C-G formula based on SCr of 9.82 mg/dL (H)). Liver Function Tests:  Recent Labs Lab 04/23/17 04/24/17 1158  AST 16 96*  ALT 8 26  ALKPHOS 117 112  BILITOT  --  0.9  PROT  --  7.6  ALBUMIN  --  3.2*   No results for input(s): LIPASE, AMYLASE in the last 168 hours. No results for input(s): AMMONIA in the last 168 hours. Coagulation Profile:  Recent Labs Lab 04/24/17 1158  INR 1.62   Cardiac Enzymes:  Recent Labs Lab 04/24/17 2057 04/25/17 0246 04/25/17 0730  TROPONINI 1.64* 0.97* 0.62*   BNP (last 3 results) No results for input(s): PROBNP in the last 8760 hours. HbA1C: No results for input(s): HGBA1C in the last 72 hours. CBG: No results for input(s): GLUCAP in the last 168 hours. Lipid Profile: No results for input(s): CHOL, HDL, LDLCALC, TRIG, CHOLHDL, LDLDIRECT in the last 72 hours. Thyroid Function Tests: No results for input(s): TSH, T4TOTAL, FREET4, T3FREE,  THYROIDAB in the last 72 hours. Anemia Panel: No results for input(s): VITAMINB12, FOLATE, FERRITIN, TIBC, IRON, RETICCTPCT in the last 72 hours. Urine analysis:    Component Value Date/Time   COLORURINE YELLOW 02/18/2016 0130   APPEARANCEUR CLEAR 02/18/2016 0130   LABSPEC 1.010 02/18/2016 0130   PHURINE 8.0 02/18/2016 0130   GLUCOSEU 100 (A) 02/18/2016 0130   HGBUR MODERATE (A) 02/18/2016 0130   BILIRUBINUR NEGATIVE 02/18/2016 0130   KETONESUR NEGATIVE 02/18/2016 0130   PROTEINUR 100 (A) 02/18/2016 0130   UROBILINOGEN 0.2 04/10/2015 1800   NITRITE NEGATIVE 02/18/2016 0130   LEUKOCYTESUR SMALL (A) 02/18/2016 0130   Sepsis Labs: '@LABRCNTIP' (procalcitonin:4,lacticidven:4)  ) Recent Results (from the past 240 hour(s))  Culture, blood (Routine x 2)     Status: None (Preliminary result)   Collection Time: 04/24/17  3:08 PM  Result Value Ref Range Status   Specimen Description BLOOD RIGHT ANTECUBITAL  Final   Special Requests   Final    BOTTLES DRAWN AEROBIC AND ANAEROBIC Blood Culture adequate volume   Culture NO GROWTH < 24 HOURS  Final   Report Status PENDING  Incomplete  Culture, blood (Routine x 2)     Status: None (Preliminary result)   Collection Time: 04/24/17  3:50 PM  Result Value Ref Range Status   Specimen Description BLOOD RIGHT ANTECUBITAL  Final   Special Requests   Final    BOTTLES DRAWN AEROBIC AND ANAEROBIC Blood Culture adequate volume   Culture NO GROWTH < 24 HOURS  Final   Report Status PENDING  Incomplete  MRSA PCR Screening     Status: Abnormal   Collection Time: 04/24/17  9:48 PM  Result Value Ref Range Status   MRSA by PCR POSITIVE (A) NEGATIVE Final    Comment:        The GeneXpert MRSA Assay (FDA approved for NASAL specimens only), is one component of a comprehensive MRSA colonization surveillance program. It is not intended to diagnose MRSA infection nor to guide or monitor treatment for MRSA infections. RESULT CALLED TO, READ BACK BY AND  VERIFIED WITH: E.CASTRO, RN 04/25/17 0408 L.CHAMPION       Radiology Studies: Dg  Chest 2 View  Result Date: 04/24/2017 CLINICAL DATA:  Shortness of breath. EXAM: CHEST  2 VIEW COMPARISON:  Radiographs of Feb 15, 2017. FINDINGS: Stable cardiomegaly. No pneumothorax or significant pleural effusion is noted. Left lung is clear. Mild right basilar subsegmental atelectasis is noted. Bony thorax is unremarkable. IMPRESSION: Mild right basilar subsegmental atelectasis. Electronically Signed   By: Marijo Conception, M.D.   On: 04/24/2017 15:48     Scheduled Meds: . calcium acetate  1,334 mg Oral TID WC  . Chlorhexidine Gluconate Cloth  6 each Topical Q0600  . darbepoetin (ARANESP) injection - DIALYSIS  200 mcg Intravenous Q Thu-HD  . doxercalciferol  7 mcg Intravenous Q T,Th,Sa-HD  . heparin  5,000 Units Subcutaneous Q8H  . isosorbide mononitrate  60 mg Oral Daily  . lamoTRIgine  50 mg Oral Daily  . LORazepam  0.5 mg Oral QHS  . multivitamin  1 tablet Oral Daily  . mupirocin ointment  1 application Nasal BID  . pantoprazole (PROTONIX) IV  40 mg Intravenous Q24H  . saccharomyces boulardii  250 mg Oral BID  . senna-docusate  2 tablet Oral QHS  . sertraline  50 mg Oral Daily  . sevelamer carbonate  1,600 mg Oral TID WC  . simethicone  80 mg Oral TID WC  . sodium chloride flush  3 mL Intravenous Q12H  . [START ON 04/26/2017] valACYclovir  500 mg Oral Q48H   Continuous Infusions: . ceFEPime (MAXIPIME) IV    . vancomycin       LOS: 1 day   Time Spent in minutes   30 minutes  Riona Lahti D.O. on 04/25/2017 at 1:37 PM  Between 7am to 7pm - Pager - [phone]  After 7pm go to [email or web address] - password [email or web address]  And look for the night coverage person covering for me after hours  [facility] Office  [phone]

## 2017-04-25 NOTE — Progress Notes (Signed)
Pt went to HD. Report given to HD nurse.

## 2017-04-25 NOTE — Progress Notes (Signed)
  Echocardiogram 2D Echocardiogram has been performed.  [name] M 04/25/2017, 1:54 PM

## 2017-04-25 NOTE — Progress Notes (Signed)
[facility] Progress Note  Subjective:  Seen in room. Afebrile overnight. Eating breakfast. Feeling better today.   Objective Vitals:   04/24/17 1930 04/24/17 2030 04/25/17 0415 04/25/17 0845  BP: 118/78 108/77 127/82 135/87  Pulse: 76 71 60 64  Resp: 16 19 20 19   Temp:  99.8 F (37.7 C) 98.2 F (36.8 C) 98.5 F (36.9 C)  TempSrc:  Oral Oral Oral  SpO2: 97% 98% 98% 96%  Weight:  65.7 kg (144 lb 12.8 oz)    Height:  5\' 7"  (1.702 m)     Physical Exam General: WNWD AAF NAD Heart: RRR systolic murmur Lungs: CTAB, slt diminished at bases  Abdomen: soft NT Extremities: no LE edema  Dialysis Access: LUE AVF +bruit    Dialysis Orders:  [facility] on TTS since 11/15. Primary Nephrologist [place]. EDW  64.5 kg. HD Bath 2k, 3 Ca, Dialyzer 180NR, Access LUA AVF No Heparin  Aranesp 200 IV q wk (last 7/19) Hectorol 7 mcg IV q HD Parsabiv 5mg  IV q HD   Assessment/Plan: 1. Fever - Tmax 103.56F on admission CXR with R basilar atelectasis. Treating for HCAP. IV cefepime/vanc per primary Blood cultures NGTD 2. ESRD -  TTS. Missed Tuesday. For HD today on schedule 3.5 Ca bath  3. 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 Will check if Parsabiv on formulary  5. HTN/volume - BP controlled/Mild volume on CXR - UF to EDW  6. Nutrition -  Renal diet/vitamins 7. MM - followed by Heme/Onc 8. Thrombocytopenia     Lynnda Child PA-C [facility] Pager [phone] 04/25/2017,12:01 PM  LOS: 1 day   Pt seen, examined and agree w A/P as above.  Kelly Splinter MD [facility] pager [phone]   04/25/2017, 1:49 PM    Additional Objective Labs: Basic Metabolic Panel:  Recent Labs Lab 04/23/17 04/24/17 1158 04/25/17 0730  NA 136* 134* 133*  K 4.9 4.9 5.8*  CL  --  96* 98*  CO2  --  23 21*  GLUCOSE  --  88 72  BUN 61* 59* 72*  CREATININE 8.5* 8.60* 9.82*  CALCIUM  --  8.7* 7.5*   Liver  Function Tests:  Recent Labs Lab 04/23/17 04/24/17 1158  AST 16 96*  ALT 8 26  ALKPHOS 117 112  BILITOT  --  0.9  PROT  --  7.6  ALBUMIN  --  3.2*   No results for input(s): LIPASE, AMYLASE in the last 168 hours. CBC:  Recent Labs Lab 04/23/17 04/24/17 1158 04/25/17 0730  WBC 5.6 5.3 8.6  NEUTROABS 5 5.0  --   HGB 9.5* 9.8* 9.2*  HCT 30* 30.2* 29.2*  MCV  --  100.3* 101.4*  PLT 119* 81* 72*   Blood Culture    Component Value Date/Time   SDES BLOOD RIGHT ANTECUBITAL 04/24/2017 1550   SPECREQUEST  04/24/2017 1550    BOTTLES DRAWN AEROBIC AND ANAEROBIC Blood Culture adequate volume   CULT NO GROWTH < 24 HOURS 04/24/2017 1550   REPTSTATUS PENDING 04/24/2017 1550    Cardiac Enzymes:  Recent Labs Lab 04/24/17 2057 04/25/17 0246  TROPONINI 1.64* 0.97*   CBG: No results for input(s): GLUCAP in the last 168 hours. Iron Studies: No results for input(s): IRON, TIBC, TRANSFERRIN, FERRITIN in the last 72 hours. Lab Results  Component Value Date   INR 1.62 04/24/2017   INR 1.39 03/15/2014   Medications: . ceFEPime (MAXIPIME)  IV    . vancomycin     . calcium acetate  1,334 mg Oral TID WC  . Chlorhexidine Gluconate Cloth  6 each Topical Q0600  . cinacalcet  60 mg Oral Q breakfast  . darbepoetin (ARANESP) injection - DIALYSIS  200 mcg Intravenous Q Thu-HD  . doxercalciferol  7 mcg Intravenous Q T,Th,Sa-HD  . heparin  5,000 Units Subcutaneous Q8H  . isosorbide mononitrate  60 mg Oral Daily  . lamoTRIgine  50 mg Oral Daily  . LORazepam  0.5 mg Oral QHS  . multivitamin  1 tablet Oral Daily  . mupirocin ointment  1 application Nasal BID  . pantoprazole (PROTONIX) IV  40 mg Intravenous Q24H  . saccharomyces boulardii  250 mg Oral BID  . senna-docusate  2 tablet Oral QHS  . sertraline  50 mg Oral Daily  . sevelamer carbonate  1,600 mg Oral TID WC  . simethicone  80 mg Oral TID WC  . sodium chloride flush  3 mL Intravenous Q12H  . [START ON 04/26/2017] valACYclovir   500 mg Oral Q48H

## 2017-04-26 LAB — CBC
HEMATOCRIT: 28.8 % — AB (ref 36.0–46.0)
HEMOGLOBIN: 9.3 g/dL — AB (ref 12.0–15.0)
MCH: 32.1 pg (ref 26.0–34.0)
MCHC: 32.3 g/dL (ref 30.0–36.0)
MCV: 99.3 fL (ref 78.0–100.0)
PLATELETS: 60 10*3/uL — AB (ref 150–400)
RBC: 2.9 MIL/uL — ABNORMAL LOW (ref 3.87–5.11)
RDW: 18.2 % — AB (ref 11.5–15.5)
WBC: 10.2 10*3/uL (ref 4.0–10.5)

## 2017-04-26 LAB — BASIC METABOLIC PANEL
Anion gap: 8 (ref 5–15)
BUN: 22 mg/dL — AB (ref 6–20)
CO2: 28 mmol/L (ref 22–32)
Calcium: 8.2 mg/dL — ABNORMAL LOW (ref 8.9–10.3)
Chloride: 100 mmol/L — ABNORMAL LOW (ref 101–111)
Creatinine, Ser: 4.63 mg/dL — ABNORMAL HIGH (ref 0.44–1.00)
GFR calc Af Amer: 10 mL/min — ABNORMAL LOW (ref >60)
GFR, EST NON AFRICAN AMERICAN: 8 mL/min — AB (ref >60)
GLUCOSE: 73 mg/dL (ref 65–99)
POTASSIUM: 4.3 mmol/L (ref 3.5–5.1)
Sodium: 136 mmol/L (ref 135–145)

## 2017-04-26 LAB — HEPATITIS B SURFACE ANTIGEN: Hepatitis B Surface Ag: NEGATIVE

## 2017-04-26 LAB — PROCALCITONIN

## 2017-04-26 MED ORDER — CEFUROXIME AXETIL 500 MG PO TABS: 500.0000 mg | ORAL_TABLET | Freq: Every day | ORAL | 0 refills | Status: AC

## 2017-04-26 MED ORDER — LORAZEPAM 0.5 MG PO TABS: 0.5000 mg | ORAL_TABLET | Freq: Every day | ORAL | 0 refills | Status: DC

## 2017-04-26 NOTE — Discharge Instructions (Signed)
Healthcare-Associated Pneumonia Healthcare-associated pneumonia is a lung infection that a person can get when in a health care setting or during certain procedures. The infection causes air sacs inside the lungs to fill with pus or fluid. Healthcare-associated pneumonia is usually caused by bacteria that are common in health care settings. These bacteria may be resistant to some antibiotic medicines. What are the causes? This condition is caused by bacteria that get into your lungs. You can get this condition if you:  Breathe in droplets from an infected person's cough or sneeze.  Touch something that an infected person coughed or sneezed on and then touch your mouth, nose, or eyes.  Have a bacterial infection somewhere else in your body, if the bacteria spread to your lungs through your blood.  What increases the risk? This condition is more likely to develop in people who:  Have a disease that weakens their body's defense system (immune system) or their ability to cough out germs.  Are older than age 65.  Having trouble swallowing.  Use a feeding or breathing tube.  Have a cold or the flu.  Have an IV tube inserted in a vein.  Have surgery.  Have a bed sore.  Live in a long-term care facility, such as a nursing home.  Were in the hospital for two or more days in the past 3 months.  Received hemodialysis in the past 30 days.  What are the signs or symptoms? Symptoms of this condition include:  Fever.  Chills.  Cough.  Shortness of breath.  Wheezing or crackling sounds when breathing.  How is this diagnosed? This condition may be diagnosed based on:  Your symptoms.  A chest X-ray.  A measurement of the amount of oxygen in your blood.  How is this treated? This condition is treated with antibiotics. Your health care provider may take a sample of cells (culture) from your throat to determine what type of bacteria is in your lungs and change your antibiotic  based on the results. If you have bacteria in your blood, trouble breathing, or a low oxygen level, you may need to be treated at the hospital. At the hospital, you will be given antibiotics through an IV tube. You may also be given oxygen or breathing treatments. Follow these instructions at home: Medicine  Take your antibiotic medicine as told by your health care provider. Do not stop taking the antibiotic even if you start to feel better.  Take over-the-counter and other prescription medicines only as told by your health care provider. Activity  Rest at home until you feel better.  Return to your normal activities as told by your health care provider. Ask your health care provider what activities are safe for you. General instructions  Drink enough fluid to keep your urine clear or pale yellow.  Do not use any products that contain nicotine or tobacco, such as cigarettes and e-cigarettes. If you need help quitting, ask your health care provider.  Limit alcohol intake to no more than 1 drink per day for nonpregnant women and 2 drinks per day for men. One drink equals 12 oz of beer, 5 oz of wine, or 1 oz of hard liquor.  Keep all follow-up visits as told by your health care provider. This is important. How is this prevented? Actions that I can take To lower your risk of getting this condition again:  Do not smoke. This includes e-cigarettes.  Do not drink too much alcohol.  Keep your immune system healthy   by eating well and getting enough sleep.  Get a flu shot every year (annually).  Get a pneumonia vaccination if: ? You are older than age 65. ? You smoke. ? You have a long-lasting condition like lung disease.  Exercise your lungs by taking deep breaths, walking, and using an incentive spirometer as directed.  Wash your hands often with soap and water. If you cannot get to a sink to wash your hands, use an alcohol-based hand cleaner.  Make sure your health care providers  are washing their hands. If you do not see them wash their hands, ask them to do so.  When you are in a health care facility, avoid touching your eyes, nose, and mouth.  Avoid touching any surface near where people have coughed or sneezed.  Stand away from sick people when they are coughing or sneezing.  Wear a mask if you cannot avoid exposure to people who are sick.  Clean all surfaces often with a disinfectant cleaner, especially if someone is sick at home or work.  Precautions of my health care team Hospitals, nursing homes, and other health care facilities take special care to try to prevent healthcare-associated pneumonia. To do this, your health care team may:  Clean their hands with soap and water or with alcohol-based hand sanitizer before and after seeing patients.  Wear gloves or masks during treatment.  Sanitize medical instruments, tubes, other equipment, and surfaces in patient rooms.  Raise (elevate) the head of your hospital bed so you are not lying flat. The head of the bed may be elevated 30 degrees or more.  Have you sit up and move around as soon as possible after surgery.  Only insert a breathing tube if needed.  Do these things for you if you have a breathing tube: ? Clean the inside of your mouth regularly. ? Remove the breathing tube as soon as it is no longer needed.  Contact a health care provider if:  Your symptoms do not get better or they get worse.  Your symptoms come back after you have finished taking your antibiotics. Get help right away if:  You have trouble breathing.  You have confusion or difficulty thinking. This information is not intended to replace advice given to you by your health care provider. Make sure you discuss any questions you have with your health care provider. Document Released: 02/07/2016 Document Revised: 07/03/2016 Document Reviewed: 06/15/2016 Elsevier Interactive Patient Education  2018 [employer]  

## 2017-04-26 NOTE — Progress Notes (Signed)
Patient being discharged to [facility]. Transport Environmental consultant) is here to take patient.

## 2017-04-26 NOTE — Clinical Social Work Note (Signed)
Patient medically stable for discharge back to [facility] today. Facility notified and discharge clinicals transmitted to admissions director. Patient declined CSW contacting her daughter, and indicated that she is aware of her discharge. Ms. Uhlir will be transported back to facility by ambulance.  Chastin Garlitz Givens, MSW, LCSW Licensed Clinical Social Worker [place] [phone]

## 2017-04-26 NOTE — Progress Notes (Signed)
Subjective:  No cos , Tolerated HD yets on schedule/ "hungry "  Objective Vital signs in last 24 hours: Vitals:   04/25/17 2340 04/26/17 0012 04/26/17 0615 04/26/17 0906  BP: 133/86 134/86 122/87 134/80  Pulse: (!) 59 62 (!) 59 61  Resp: 17 17 18 18   Temp: 97.9 F (36.6 C) 98.5 F (36.9 C) 98.9 F (37.2 C) 99.1 F (37.3 C)  TempSrc: Oral Oral Oral Oral  SpO2: 98% 99% 99% 97%  Weight: 65.6 kg (144 lb 10 oz)     Height:       Weight change: 0.719 kg (1 lb 9.4 oz)   Physical Exam General: WNWD AAF NAD Heart: RRR systolic murmur Lungs: CTAB, slt diminished at bases  Abdomen: soft NT Extremities: no LE edema  Dialysis Access: LUE AVF +bruit    Dialysis Orders:  [facility] on TTS since 11/15. Primary Nephrologist [place]. EDW 64.5 kg. HD Bath 2k, 3 Ca, Dialyzer 180NR, Access LUA AVF No Heparin  Aranesp 200 IV q wk (last 7/19) Hectorol 7 mcg IV q HD Parsabiv 5mg  IV q HD   Problem/Plan: 1. Fever - Tmax 103.17F on admission CXR with R basilar atelectasis. Now afeb 24 hr  / admit  Treating for HCAP. IV cefepime/vanc per primary Blood cultures NGTD 2. ESRD -  TTS. Missed Tuesday. For HD  on schedule  3.5 Ca bath  corec Ca 8.6 / HD tomor .Ok to do as op  Per renal  3. 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 Will check if Parsabiv on formulary  5. HTN/volume - BP controlled/Mild volume on CXR - UF to EDW  6. Nutrition -  Renal diet/vitamins alb 3.2  7. MM - followed by Heme/Onc  Ernest Haber, PA-C [place] [phone] 04/26/2017,9:44 AM  LOS: 2 days   Pt seen, examined and agree w A/P as above.  Kelly Splinter MD [facility] pager [phone]   04/26/2017, 1:17 PM    Labs: Basic Metabolic Panel:  Recent Labs Lab 04/24/17 1158 04/25/17 0730 04/26/17 0417  NA 134* 133* 136  K 4.9 5.8* 4.3  CL 96* 98* 100*  CO2 23 21* 28  GLUCOSE 88 72 73  BUN 59* 72* 22*  CREATININE  8.60* 9.82* 4.63*  CALCIUM 8.7* 7.5* 8.2*   Liver Function Tests:  Recent Labs Lab 04/23/17 04/24/17 1158  AST 16 96*  ALT 8 26  ALKPHOS 117 112  BILITOT  --  0.9  PROT  --  7.6  ALBUMIN  --  3.2*   No results for input(s): LIPASE, AMYLASE in the last 168 hours. No results for input(s): AMMONIA in the last 168 hours. CBC:  Recent Labs Lab 04/23/17 04/24/17 1158 04/25/17 0730 04/26/17 0417  WBC 5.6 5.3 8.6 10.2  NEUTROABS 5 5.0  --   --   HGB 9.5* 9.8* 9.2* 9.3*  HCT 30* 30.2* 29.2* 28.8*  MCV  --  100.3* 101.4* 99.3  PLT 119* 81* 72* 60*   Cardiac Enzymes:  Recent Labs Lab 04/24/17 2057 04/25/17 0246 04/25/17 0730  TROPONINI 1.64* 0.97* 0.62*   CBG: No results for input(s): GLUCAP in the last 168 hours.  Studies/Results: Dg Chest 2 View  Result Date: 04/24/2017 CLINICAL DATA:  Shortness of breath. EXAM: CHEST  2 VIEW COMPARISON:  Radiographs of Feb 15, 2017. FINDINGS: Stable cardiomegaly. No pneumothorax or significant pleural effusion is noted. Left lung is clear.  Mild right basilar subsegmental atelectasis is noted. Bony thorax is unremarkable. IMPRESSION: Mild right basilar subsegmental atelectasis. Electronically Signed   By: Marijo Conception, M.D.   On: 04/24/2017 15:48   Medications: . ceFEPime (MAXIPIME) IV 1 g (04/25/17 1719)  . vancomycin 750 mg (04/25/17 2233)   . calcium acetate  1,334 mg Oral TID WC  . Chlorhexidine Gluconate Cloth  6 each Topical Q0600  . darbepoetin (ARANESP) injection - DIALYSIS  200 mcg Intravenous Q Thu-HD  . doxercalciferol  7 mcg Intravenous Q T,Th,Sa-HD  . [START ON 04/27/2017] Etelcalcetide HCl  5 mg Intravenous Q T,Th,Sa-HD  . heparin  5,000 Units Subcutaneous Q8H  . isosorbide mononitrate  60 mg Oral Daily  . lamoTRIgine  50 mg Oral Daily  . LORazepam  0.5 mg Oral QHS  . multivitamin  1 tablet Oral Daily  . mupirocin ointment  1 application Nasal BID  . pantoprazole (PROTONIX) IV  40 mg Intravenous Q24H  .  saccharomyces boulardii  250 mg Oral BID  . senna-docusate  2 tablet Oral QHS  . sertraline  50 mg Oral Daily  . sevelamer carbonate  1,600 mg Oral TID WC  . simethicone  80 mg Oral TID WC  . sodium chloride flush  3 mL Intravenous Q12H  . valACYclovir  500 mg Oral Q48H

## 2017-04-26 NOTE — Progress Notes (Signed)
Report called and given to RN at [facility]. Will continue to monitor.

## 2017-04-26 NOTE — Discharge Summary (Signed)
Physician Discharge Summary  [name] YTK:[id number] DOB: 04-21-1941 DOA: 04/24/2017  PCP: Hendricks Limes, MD  Admit date: 04/24/2017 Discharge date: 04/26/2017  Time spent: 45 minutes  Recommendations for Outpatient Follow-up:  Patient will be discharged to [facility].  Patient will need to follow up with primary care provider within one week of discharge.  Continue hemodialysis as scheduledPatient should continue medications as prescribed.  Patient should follow a renal diet with 1250m fluid restriction per day.   Discharge Diagnoses:  Sepsis secondary to suspected HCAP End-stage renal disease Elevated troponin Anemia of chronic disease Thrombocytopenia Multiple myeloma Nausea/vomiting GERD Hyperkalemia Depression/anxiety Essential hypertension   Discharge Condition:   Diet recommendation: renal diet with 12053mfluid restriction per day  Filed Weights   04/24/17 2030 04/25/17 2005 04/25/17 2340  Weight: 65.7 kg (144 lb 12.8 oz) 66.4 kg (146 lb 6.2 oz) 65.6 kg (144 lb 10 oz)    History of present illness:  on 04/24/2017 by Dr. AnLenna Gilfordyrdis a [age]ear old female, SNFresident, ambulates with the help of a wheelchair/walker, PMH of ESRD on TTS HD, HTN, GERD, [facility] sent from HD due to above complaints. Patient and her family at bedside (daughter who is a mePsychologist, sport and exercisend patient's brother) provided history. Patient has apparently been having chronic cough ongoing for the last 2 months with intermittent white sputum but no fever, chills or dyspnea. Since day prior to admission, patient started having chills, fevers but is unable to say if her cough got any worse. She denies headache, earache and reports some sore throat. No chest pain, palpitations. On day prior to admission, she also apparently sustained a fall on her bottom when she was trying to sit on her wheelchair but did not sustain  any obvious injuries. She missed her dialysis yesterday due to not feeling well. She was confused and found on the floor. She had a large BM with some blood streaks. She was evaluated by NP at SNF, blood pressure was 193/105, temperature 100.2 and chest x-ray showed mild atelectatic changes/pneumonia at the right lower lobe. She was given a dose of Rocephin 1 g IM and breathing treatments. Patient stated that she felt better. This morning she went for her usual dialysis but shortly after being on dialysis for approximately an hour, she felt unwell, chills, fevers, nonbloody emesis, dialysis was stopped and patient was sent to the ED for further evaluation and management. Patient gives 2 days history of intermittent nonbloody emesis. She reported an episode of dark stools but rectal exam in ED shows soft brown stools and FOBT is pending. She reports chronic? Coughing when she eats or drinks which has not changed.  Hospital Course:  Sepsis secondary to suspected HCAP -Patient presented with fever, tachypnea -Chest x-ray mild right basilar segmental atelectasis -Initially placed on vancomycin and cefepime -Blood cultures show no growth to date -Pro calcitonin greater than 150, lactic acid 2.19 on admission -Lactic acid currently trending downward and within normal range -Vital signs have stabilized, patient has been afebrile for more than 24 hours -Will discharge patient with Ceftin 500 mg daily at bedtime for an additional 5 days  End-stage renal disease -On hemodialysis, Tuesday, Thursday, Saturday -Patient missed Tuesday dialysis treatment, presented to dialysis on Wednesday, 04/24/2017 however only completed 2 hours she started to feel bad. -Nephrology consulted and appreciated  Elevated troponin -Patient currently chest pain-free -Suspect secondary to demand ischemia from sepsis and end-stage renal disease -Troponin  peaked at 1.64, currently trending downward -Echocardiogram EF of 55-60%,  grade 1 diastolic dysfunction, no regional wall motion abnormalities  Anemia of chronic disease -Hemoglobin was 11 and April 2018 however prior to that baseline appears to be 9-10 -Currently hemoglobin 9.3 -Continue to monitor CBC  Thrombocytopenia -Likely related to multiple myeloma -Platelets currently 60, continue to monitor CBC  Multiple myeloma -Patient follows at [place] -Resolved -Possibly related to her cough -Continue antiemetics as needed  GERD -Continue PPI  Hyperkalemia -Resolved  Depression/anxiety -Continue Zoloft, Ativan  Essential hypertension -On amlodipine daily, however after HD on TTS if systolic BP >283 -Continue home medications at discharge  Procedures: Echocardiogram  Consultations: Nephrology  Discharge Exam: Vitals:   04/26/17 0615 04/26/17 0906  BP: 122/87 134/80  Pulse: (!) 59 61  Resp: 18 18  Temp: 98.9 F (37.2 C) 99.1 F (37.3 C)   Patient feeling much better today. Denies further episodes of nausea or vomiting. Able to eat and tolerate diet. Denies any abdominal pain, chest pain, shortness breath, dizziness, headache.   General: Well developed, well nourished, NAD, appears stated age  34: NCAT,mucous membranes moist.  Cardiovascular: S1 S2 auscultated, soft murmur, RRR  Respiratory: Clear to auscultation bilaterally  Abdomen: Soft, nontender, nondistended, + bowel sounds  Extremities: warm dry without cyanosis clubbing or edema  Neuro: AAOx3, nonofocal  Psych: Appropriate mood and affect, pleasant  Discharge Instructions Discharge Instructions    Discharge instructions    Complete by:  As directed    Patient will be discharged to [facility]  Patient will need to follow up with primary care provider within one week of discharge.  Continue hemodialysis as scheduledPatient should continue medications as prescribed.  Patient should follow a renal diet with 1253m fluid restriction  per day.     Current Discharge Medication List    START taking these medications   Details  cefUROXime (CEFTIN) 500 MG tablet Take 1 tablet (500 mg total) by mouth at bedtime. Qty: 5 tablet, Refills: 0      CONTINUE these medications which have CHANGED   Details  LORazepam (ATIVAN) 0.5 MG tablet Take 1 tablet (0.5 mg total) by mouth at bedtime. Qty: 5 tablet, Refills: 0      CONTINUE these medications which have NOT CHANGED   Details  acetaminophen (TYLENOL) 325 MG tablet Take 650 mg by mouth every 6 (six) hours as needed for fever (FOR 2 WEEKS).    albuterol (PROVENTIL) (2.5 MG/3ML) 0.083% nebulizer solution Take 3 mLs by nebulization 3 (three) times daily. FOR 3 DAYS STARTING ON 04/23/17 THEN EVERY 6 HOURS FOR 2 WEEKS AS NEEDED FOR SHORTNESS OF BREATH OR WHEEZING    amLODipine (NORVASC) 10 MG tablet Take 10 mg by mouth See admin instructions. HOLD UNTIL AFTER DIALYSIS ON TUES/THURS/SAT AND HOLD FOR SYSTOLIC B/P READING OF 1662OR LESS    bisacodyl (BISAC-EVAC) 10 MG suppository Place 10 mg rectally once as needed (FOR CONSTIPATION NOT RELIEVED BY MILK OF MAGNESIA).     calcium acetate (PHOSLO) 667 MG capsule Take 1,334 mg by mouth 3 (three) times daily.     cinacalcet (SENSIPAR) 60 MG tablet Take 60 mg by mouth daily.    cloNIDine (CATAPRES) 0.1 MG tablet Take 0.1 mg by mouth every 8 (eight) hours as needed (for a systolic B/P of 1947or greater).     DEXTROMETHORPHAN HBR PO Take 20 mg by mouth every 8 (eight) hours as needed (for cough).     hydrocortisone  cream 1 % Apply 1 application topically daily as needed (for hemorrhoids).     isosorbide mononitrate (IMDUR) 60 MG 24 hr tablet Take 60 mg by mouth daily.     lamoTRIgine (LAMICTAL) 25 MG tablet Take 50 mg by mouth daily.     loperamide (IMODIUM A-D) 2 MG tablet Take 2 mg by mouth See admin instructions. AFTER EACH LOOSE STOOL/NOT TO EXCEED 6 TABLETS IN 24 HOURS FOR 2 DAYS (MAY ALSO TAKE 4 MG AS NEEDED PRE-DIALYSIS)      magnesium hydroxide (MILK OF MAGNESIA) 400 MG/5ML suspension Take 15-30 mLs by mouth once as needed for mild constipation.    multivitamin (RENA-VIT) TABS tablet Take 1 tablet by mouth daily.    ondansetron (ZOFRAN ODT) 4 MG disintegrating tablet Take 1 tablet (4 mg total) by mouth every 8 (eight) hours as needed for nausea or vomiting. Qty: 12 tablet, Refills: 0    promethazine (PHENERGAN) 25 MG tablet Take 25 mg by mouth every 6 (six) hours as needed for nausea or vomiting.    ranitidine (ZANTAC) 300 MG tablet Take 300 mg by mouth at bedtime. Reported on 03/05/2016    saccharomyces boulardii (FLORASTOR) 250 MG capsule Take 250 mg by mouth 2 (two) times daily. For 10 days    sennosides-docusate sodium (SENOKOT-S) 8.6-50 MG tablet Take 2 tablets by mouth at bedtime.    sertraline (ZOLOFT) 50 MG tablet Take 50 mg by mouth daily.     sevelamer carbonate (RENVELA) 800 MG tablet Take 1,600 mg by mouth 3 (three) times daily with meals.     simethicone (MYLICON) 408 MG chewable tablet Take 1 tablet by mouth prior to each meal.    Sodium Phosphates (RA SALINE ENEMA) 19-7 GM/118ML ENEM Place 1 enema rectally once as needed (if constipation not relieved by Dulcolax suppository). CALL DOCTOR IF NO RELIEF FROM THIS    UNABLE TO FIND Nephro supplement: Drink by mouth two times a day (may D/C if patient is consistently refusing)    valACYclovir (VALTREX) 500 MG tablet Take 1 tablet (500 mg total) by mouth every other day.      STOP taking these medications     cefTRIAXone (ROCEPHIN) 1 g injection        No Known Allergies Follow-up Information    Hendricks Limes, MD. Schedule an appointment as soon as possible for a visit in 1 week(s).   Specialty:  Internal Medicine Why:  Hospital follow up Contact information: [place] [address] [phone]            The results of significant diagnostics from this hospitalization (including imaging, microbiology, ancillary  and laboratory) are listed below for reference.    Significant Diagnostic Studies: Dg Chest 2 View  Result Date: 04/24/2017 CLINICAL DATA:  Shortness of breath. EXAM: CHEST  2 VIEW COMPARISON:  Radiographs of Feb 15, 2017. FINDINGS: Stable cardiomegaly. No pneumothorax or significant pleural effusion is noted. Left lung is clear. Mild right basilar subsegmental atelectasis is noted. Bony thorax is unremarkable. IMPRESSION: Mild right basilar subsegmental atelectasis. Electronically Signed   By: Marijo Conception, M.D.   On: 04/24/2017 15:48    Microbiology: Recent Results (from the past 240 hour(s))  Culture, blood (Routine x 2)     Status: None (Preliminary result)   Collection Time: 04/24/17  3:08 PM  Result Value Ref Range Status   Specimen Description BLOOD RIGHT ANTECUBITAL  Final   Special Requests   Final    BOTTLES  DRAWN AEROBIC AND ANAEROBIC Blood Culture adequate volume   Culture NO GROWTH < 24 HOURS  Final   Report Status PENDING  Incomplete  Culture, blood (Routine x 2)     Status: None (Preliminary result)   Collection Time: 04/24/17  3:50 PM  Result Value Ref Range Status   Specimen Description BLOOD RIGHT ANTECUBITAL  Final   Special Requests   Final    BOTTLES DRAWN AEROBIC AND ANAEROBIC Blood Culture adequate volume   Culture NO GROWTH < 24 HOURS  Final   Report Status PENDING  Incomplete  MRSA PCR Screening     Status: Abnormal   Collection Time: 04/24/17  9:48 PM  Result Value Ref Range Status   MRSA by PCR POSITIVE (A) NEGATIVE Final    Comment:        The GeneXpert MRSA Assay (FDA approved for NASAL specimens only), is one component of a comprehensive MRSA colonization surveillance program. It is not intended to diagnose MRSA infection nor to guide or monitor treatment for MRSA infections. RESULT CALLED TO, READ BACK BY AND VERIFIED WITH: E.CASTRO, RN 04/25/17 0408 L.CHAMPION      Labs: Basic Metabolic Panel:  Recent Labs Lab 04/23/17 04/24/17 1158  04/25/17 0730 04/26/17 0417  NA 136* 134* 133* 136  K 4.9 4.9 5.8* 4.3  CL  --  96* 98* 100*  CO2  --  23 21* 28  GLUCOSE  --  88 72 73  BUN 61* 59* 72* 22*  CREATININE 8.5* 8.60* 9.82* 4.63*  CALCIUM  --  8.7* 7.5* 8.2*   Liver Function Tests:  Recent Labs Lab 04/23/17 04/24/17 1158  AST 16 96*  ALT 8 26  ALKPHOS 117 112  BILITOT  --  0.9  PROT  --  7.6  ALBUMIN  --  3.2*   No results for input(s): LIPASE, AMYLASE in the last 168 hours. No results for input(s): AMMONIA in the last 168 hours. CBC:  Recent Labs Lab 04/23/17 04/24/17 1158 04/25/17 0730 04/26/17 0417  WBC 5.6 5.3 8.6 10.2  NEUTROABS 5 5.0  --   --   HGB 9.5* 9.8* 9.2* 9.3*  HCT 30* 30.2* 29.2* 28.8*  MCV  --  100.3* 101.4* 99.3  PLT 119* 81* 72* 60*   Cardiac Enzymes:  Recent Labs Lab 04/24/17 2057 04/25/17 0246 04/25/17 0730  TROPONINI 1.64* 0.97* 0.62*   BNP: BNP (last 3 results) No results for input(s): BNP in the last 8760 hours.  ProBNP (last 3 results) No results for input(s): PROBNP in the last 8760 hours.  CBG: No results for input(s): GLUCAP in the last 168 hours.     SignedCristal Ford  [facility] 04/26/2017, 12:02 PM

## 2017-04-26 NOTE — Clinical Social Work Note (Signed)
Clinical Social Work Assessment  Patient Details  Name: [name] MRN: [id number] Date of Birth: 31-Aug-1941  Date of referral:  04/26/17               Reason for consult:  Facility Placement                Permission sought to share information with:  Family Supports Permission granted to share information::  No (Patient declined CSW contacting family and reported that she has made her daughter aware of today's discharge.)  Name::        Agency::     Relationship::     Contact Information:     Housing/Transportation Living arrangements for the past 2 months:  [place] (Patient from [place]) Source of Information:  Patient Patient Interpreter Needed:  None Criminal Activity/Legal Involvement Pertinent to Current Situation/Hospitalization:  No - Comment as needed Significant Relationships:  Adult Children, Siblings Lives with:  Facility Resident (From [place]) Do you feel safe going back to the place where you live?  Yes Need for family participation in patient care:  Yes (Comment)  Care giving concerns: No concerns expressed by patient regarding her care at [facility].  Social Worker assessment / plan:  CSW talked with patient at the bedside and confirmed that she is from [place] and plans to return. When asked, patient declined CSW calling her daughter and indicated that her daughter was aware of her discharge today. Patient agreeable to ambulance transport.  Employment status:  Retired Forensic scientist:  Information systems manager, Medicaid In [place] PT Recommendations:  [place] / Referral to community resources:  [facility] (SNF Information not needed or requested as patient from skilled facility)  Patient/Family's Response to care: No concerns expressed to CSW regarding care during hospitalization.  Patient/Family's Understanding of and Emotional Response to Diagnosis, Current Treatment, and Prognosis:  Not  discussed.  Emotional Assessment Appearance:  Appears younger than stated age Attitude/Demeanor/Rapport:  Other (Appropriate) Affect (typically observed):  Appropriate Orientation:  Oriented to Self, Oriented to Place, Oriented to  Time, Oriented to Situation Alcohol / Substance use:  Never Used Psych involvement (Current and /or in the community):  No (Comment)  Discharge Needs  Concerns to be addressed:  Discharge Planning Concerns Readmission within the last 30 days:  No Current discharge risk:  None Barriers to Discharge:  No Barriers Identified   Sable Feil, LCSW 04/26/2017, 4:04 PM

## 2017-04-26 NOTE — NC FL2 (Signed)
[place] MEDICAID FL2 LEVEL OF CARE SCREENING TOOL     IDENTIFICATION  Patient Name: [name] Birthdate: [birth year] Sex: female Admission Date (Current Location): 04/24/2017  [place] and [place] Number:  [name] [id number] [place] and Address:  [facility], [place] [address] [place], [place] [address]      Provider Number: [id number]  Attending Physician Name and Address:  Cristal Ford, DO  Relative Name and Phone Number:  [name] - daughter; [phone] (mobile) and [phone] (home)    Current Level of Care: Hospital Recommended Level of Care: [place] (Patient from [place]) Prior Approval Number:    Date Approved/Denied:   PASRR Number: [id number] (Eff. 04/02/15)  Discharge Plan: SNF [facility])    Current Diagnoses: Patient Active Problem List   Diagnosis Date Noted  . HCAP (healthcare-associated pneumonia) 04/24/2017  . Sepsis ([place]) 04/24/2017  . Nausea and vomiting 04/24/2017  . Thrombocytopenia ([place]) 04/24/2017  . Hyperkalemia 01/16/2017  . IBS (irritable bowel syndrome) 12/27/2016  . Mood disorder ([place]) 07/19/2016  . Diastolic dysfunction 79/39/0300  . Insomnia 04/27/2016  . Hyponatremia 03/12/2016  . Symptomatic anemia 03/12/2016  . Diarrhea 03/12/2016  . Essential hypertension   . Swelling of joint of left knee 02/18/2016  . Leukopenia due to antineoplastic chemotherapy ([place]) 09/03/2015  . Malnutrition of moderate degree 09/02/2015  . GERD (gastroesophageal reflux disease) 04/23/2015  . 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 ESRD (end stage renal disease) ([place]) 03/11/2014  . Multiple myeloma ([place]) 03/11/2014  . Hypertension associatd with end stage renal disease on dialysis 03/11/2014    Orientation RESPIRATION BLADDER Height & Weight     Self, Time, Situation, Place  Normal Continent Weight: 144 lb 10 oz (65.6 kg) Height:  _0  (170.2 cm)  BEHAVIORAL SYMPTOMS/MOOD  NEUROLOGICAL BOWEL NUTRITION STATUS      Continent Diet (Renal diet with 1200 mL fluid restrictions)  AMBULATORY STATUS COMMUNICATION OF NEEDS Skin   Limited Assist (Min assist per PT) Verbally Other (Comment) (Surgical incisions right hip and left knee)                       Personal Care Assistance Level of Assistance  Bathing, Feeding, Dressing Bathing Assistance: Limited assistance Feeding assistance: Independent Dressing Assistance: Limited assistance     Functional Limitations Info  Sight, Hearing, Speech Sight Info: Adequate Hearing Info: Adequate Speech Info: Adequate    SPECIAL CARE FACTORS FREQUENCY  PT (By licensed PT)     PT Frequency: Evaluated 7/26 and a minimum of 2X per week therapy recommended              Contractures Contractures Info: Not present    Additional Factors Info  Code Status, Allergies Code Status Info: Full Allergies Info: No known allergies           Current Medications (04/26/2017):  This is the current hospital active medication list Current Facility-Administered Medications  Medication Dose Route Frequency Provider Last Rate Last Dose  . acetaminophen (TYLENOL) tablet 650 mg  650 mg Oral Q6H PRN Hongalgi, Lenis Dickinson, MD       Or  . acetaminophen (TYLENOL) suppository 650 mg  650 mg Rectal Q6H PRN Hongalgi, Anand D, MD      . albuterol (PROVENTIL) (2.5 MG/3ML) 0.083% nebulizer solution 2.5 mg  2.5 mg Nebulization Q2H PRN Hongalgi, Anand D, MD      . calcium acetate (PHOSLO) capsule 1,334 mg  1,334 mg Oral  TID WC Modena Jansky, MD   1,334 mg at 04/26/17 0850  . ceFEPIme (MAXIPIME) 1 g in dextrose 5 % 50 mL IVPB  1 g Intravenous Q24H Vernell Leep D, MD 100 mL/hr at 04/25/17 1719 1 g at 04/25/17 1719  . Chlorhexidine Gluconate Cloth 2 % PADS 6 each  6 each Topical Q0600 Cristal Ford, DO   6 each at 04/25/17 [phone]  . Darbepoetin Alfa (ARANESP) injection 200 mcg  200 mcg Intravenous Q Forrest Moron, MD   200 mcg at  04/25/17 2236  . doxercalciferol (HECTOROL) injection 7 mcg  7 mcg Intravenous Q Irving Shows, MD   7 mcg at 04/25/17 2235  . [START ON 04/27/2017] Etelcalcetide HCl SOLN 5 mg  5 mg Intravenous Q T,Th,Sa-HD Ejigiri, Thomos Lemons, PA-C      . heparin injection 5,000 Units  5,000 Units Subcutaneous Q8H Modena Jansky, MD   5,000 Units at 04/26/17 0527  . isosorbide mononitrate (IMDUR) 24 hr tablet 60 mg  60 mg Oral Daily Modena Jansky, MD   60 mg at 04/25/17 1041  . lamoTRIgine (LAMICTAL) tablet 50 mg  50 mg Oral Daily Modena Jansky, MD   50 mg at 04/25/17 1041  . LORazepam (ATIVAN) tablet 0.5 mg  0.5 mg Oral QHS Hongalgi, Lenis Dickinson, MD   0.5 mg at 04/24/17 2132  . multivitamin (RENA-VIT) tablet 1 tablet  1 tablet Oral Daily Modena Jansky, MD   1 tablet at 04/25/17 1041  . mupirocin ointment (BACTROBAN) 2 % 1 application  1 application Nasal BID Cristal Ford, DO   1 application at 01/75/10 1041  . ondansetron (ZOFRAN) tablet 4 mg  4 mg Oral Q6H PRN Hongalgi, Lenis Dickinson, MD       Or  . ondansetron (ZOFRAN) injection 4 mg  4 mg Intravenous Q6H PRN Modena Jansky, MD   4 mg at 04/25/17 1718  . pantoprazole (PROTONIX) injection 40 mg  40 mg Intravenous Q24H Modena Jansky, MD   40 mg at 04/24/17 2131  . saccharomyces boulardii (FLORASTOR) capsule 250 mg  250 mg Oral BID Modena Jansky, MD   250 mg at 04/25/17 1041  . senna-docusate (Senokot-S) tablet 2 tablet  2 tablet Oral QHS Modena Jansky, MD   2 tablet at 04/24/17 2136  . sertraline (ZOLOFT) tablet 50 mg  50 mg Oral Daily Modena Jansky, MD   50 mg at 04/25/17 1042  . sevelamer carbonate (RENVELA) tablet 1,600 mg  1,600 mg Oral TID WC Mauricia Area, MD   1,600 mg at 04/26/17 0850  . simethicone (MYLICON) chewable tablet 80 mg  80 mg Oral QID PRN Lynnda Child, PA-C   80 mg at 04/25/17 1041  . simethicone (MYLICON) chewable tablet 80 mg  80 mg Oral TID WC Mikhail, Maryann, DO   80 mg at 04/26/17  0850  . sodium chloride flush (NS) 0.9 % injection 3 mL  3 mL Intravenous Q12H Modena Jansky, MD   3 mL at 04/25/17 1042  . valACYclovir (VALTREX) tablet 500 mg  500 mg Oral Q48H Deterding, James, MD      . vancomycin (VANCOCIN) IVPB 750 mg/150 ml premix  750 mg Intravenous Q T,Th,Sa-HD Modena Jansky, MD 150 mL/hr at 04/25/17 2233 750 mg at 04/25/17 2233     Discharge Medications: Please see discharge summary for a list of discharge medications.  Relevant Imaging Results:  Relevant Lab Results:   Additional  Information ss#[id number].  Dialysis patient TTS at [place], [place], [place]

## 2017-04-29 ENCOUNTER — Telehealth: Payer: Self-pay

## 2017-04-29 LAB — CULTURE, BLOOD (ROUTINE X 2)
CULTURE: NO GROWTH
CULTURE: NO GROWTH
SPECIAL REQUESTS: ADEQUATE
SPECIAL REQUESTS: ADEQUATE

## 2017-04-29 NOTE — Telephone Encounter (Signed)
This is a patient of [place], who was admitted to [facility] after hospitalization. [facility] F/U is needed. Hospital discharge from [facility] on 727/2018.

## 2017-04-30 ENCOUNTER — Non-Acute Institutional Stay (SKILLED_NURSING_FACILITY): Payer: Medicare Other | Admitting: Internal Medicine

## 2017-04-30 ENCOUNTER — Encounter: Payer: Self-pay | Admitting: Internal Medicine

## 2017-04-30 DIAGNOSIS — J189 Pneumonia, unspecified organism: Secondary | ICD-10-CM | POA: Diagnosis not present

## 2017-04-30 DIAGNOSIS — I10 Essential (primary) hypertension: Secondary | ICD-10-CM

## 2017-04-30 DIAGNOSIS — R112 Nausea with vomiting, unspecified: Secondary | ICD-10-CM

## 2017-04-30 DIAGNOSIS — D630 Anemia in neoplastic disease: Secondary | ICD-10-CM | POA: Diagnosis not present

## 2017-04-30 NOTE — Assessment & Plan Note (Signed)
04/30/17 resolved, prn Zofran Continue ranitidine

## 2017-04-30 NOTE — Progress Notes (Signed)
NURSING HOME LOCATION:  [facility] ROOM NUMBER:  [id number]  CODE STATUS:  Full Code  PCP:  Hendricks Limes, MD  [address] [place] [address]  This is a nursing facility follow up for [facility] readmission within 30 days  Interim medical record and care since last [facility] visit was updated with review of diagnostic studies and change in clinical status since last visit were documented.  HPI: The patient was hospitalized 7/25-7/27/18 for sepsis secondary to suspected HCAP. This is in the context of end-stage renal disease for which she has dialysis. The patient had a chronic cough over the 2 month prior to admission with intermittent white sputum but no fever, chills, dyspnea. The day prior to admission the patient was experiencing chills and fever. Because of generalized malaise she had missed her dialysis the day prior to admission. The patient was found to be confused and found on the floor. She had passed a large bowel movement with some streaks of blood. At the SNF blood pressure initially was 193/105, temperature 100.2 and chest x-ray suggested mild atelectatic changes versus pneumonia right lower lobe.( Note: Actual 04/24/17 films reviewed. There are increased interstitial markings at the bases, greater on the right than the left without definite pneumonia. Atelectasis is suggested at the right base. These changes appear to be most pronounced posteriorly on the lateral film. There is massive cardiomegaly).  Rocephin IM was administered along with nebulizer treatments with initial subjective improvement. When she was @ dialysis the day of admission she felt sick and had associated chills, fever, and nonbloody emesis. Dialysis was interrupted & shew was sent to the ED for further evaluation. Actually 2 days prior to admission the patient had had intermittent nonbloody emesis. She also had dark stool without definite melena. Serial CBCs revealed a stable  normochromic normocytic anemia. Hemoglobin 9.3/hematocrit 28.8. Although the anemia is stable the values have dropped significantly compared to 01/16/17 when the values were 11.5/36.3. This is in the context of multiple myeloma for which she sees Dr. Illa Level.  She will complete the cefuroxime 05/01/17.  Review of systems: She describes intermittent, minor nonproductive cough. Note the patient is not on ACE inhibitor.  She states that she remains sleepy most the time. She has dyspnea on exertion when she lies her wheelchair over 15 yards. She specifically denies any significant extrinsic, or other cardio/pulmonary symptoms  & she also denies any bleeding dyscrasias. Constitutional: No fever,significant weight change Eyes: No redness, discharge, pain, vision change ENT/mouth: No nasal congestion,  purulent discharge, earache,change in hearing ,sore throat  Cardiovascular: No chest pain, palpitations,paroxysmal nocturnal dyspnea, claudication, edema  Respiratory: No sputum production,hemoptysis, significant snoring,apnea  Gastrointestinal: No heartburn,dysphagia,abdominal pain, nausea / vomiting,rectal bleeding, melena,change in bowels Genitourinary: No dysuria,hematuria, pyuria,  incontinence, nocturia Musculoskeletal: No joint stiffness, joint swelling, weakness,pain Dermatologic: No rash, pruritus, change in appearance of skin Neurologic: No dizziness,headache,syncope, seizures, numbness , tingling Psychiatric: No significant anxiety , depression, insomnia, anorexia Endocrine: No change in hair/skin/ nails, excessive thirst, excessive hunger, excessive urination  Hematologic/lymphatic: No significant bruising, lymphadenopathy,abnormal bleeding Allergy/immunology: No itchy/ watery eyes, significant sneezing, urticaria, angioedema  Physical exam:  Pertinent or positive findings: She is edentulous but wears only upper plate. She has a grade 1.5 systolic murmur at the right base. Overall breath  sounds are slightly decreased. Clubbing of the nailbeds is present. There is slight weakness in the right lower extremity. Fusiform changes of the right knee are present. Only the right posterior  tibial pulses palpable. The others are weak and essentially nonpalpable.  General appearance:Adequately nourished; no acute distress , increased work of breathing is present.   Lymphatic: No lymphadenopathy about the head, neck, axilla . Eyes: No conjunctival inflammation or lid edema is present. There is no scleral icterus. Ears:  External ear exam shows no significant lesions or deformities.   Nose:  External nasal examination shows no deformity or inflammation. Nasal mucosa are pink and moist without lesions ,exudates Oral exam: lips and gums are healthy appearing.There is no oropharyngeal erythema or exudate . Neck:  No thyromegaly, masses, tenderness noted.    Heart:  Normal rate and regular rhythm. S1 and S2 normal without gallop, click, rub .  Lungs: without wheezes, rhonchi,rales , rubs. Abdomen:Bowel sounds are normal. Abdomen is soft and nontender with no organomegaly, hernias,masses. GU: deferred  Extremities:  No cyanosis, edema  Skin: Warm & dry w/o tenting. No significant lesions or rash.  See summary under each active problem in the Problem List with associated updated therapeutic plan

## 2017-04-30 NOTE — Patient Instructions (Signed)
See assessment and plan under each diagnosis in the problem list and acutely for this visit 

## 2017-04-30 NOTE — Assessment & Plan Note (Addendum)
04/30/17 actual 7/25 chest x-rays reviewed personally clinically stable with no evidence of respiratory compromise, complete Ceftin 05/01/17 Continue bronchodilators as needed

## 2017-04-30 NOTE — Assessment & Plan Note (Signed)
04/30/17 normochromic, normocytic anemia stable but significant drop since 01/16/17 No active bleeding dyscrasias documented Defer to Dr.Gorsuch

## 2017-04-30 NOTE — Assessment & Plan Note (Signed)
BP controlled; no change in antihypertensive medications  

## 2017-05-07 ENCOUNTER — Other Ambulatory Visit: Payer: Self-pay

## 2017-05-07 MED ORDER — LORAZEPAM 0.5 MG PO TABS: 0.5000 mg | ORAL_TABLET | Freq: Every day | ORAL | 0 refills | Status: DC

## 2017-05-07 NOTE — Telephone Encounter (Signed)
Hard script written by Durenda Age, NP and given to Barbie Banner, DON at [facility] on 05/02/2017.

## 2017-05-21 LAB — CBC AND DIFFERENTIAL
HEMATOCRIT: 27 — AB (ref 36–46)
HEMOGLOBIN: 8.3 — AB (ref 12.0–16.0)
NEUTROS ABS: 2
Platelets: 161 (ref 150–399)
WBC: 3.6

## 2017-05-22 ENCOUNTER — Non-Acute Institutional Stay (SKILLED_NURSING_FACILITY): Payer: Medicare Other | Admitting: Adult Health

## 2017-05-22 ENCOUNTER — Encounter: Payer: Self-pay | Admitting: Adult Health

## 2017-05-22 DIAGNOSIS — F32A Depression, unspecified: Secondary | ICD-10-CM

## 2017-05-22 DIAGNOSIS — F329 Major depressive disorder, single episode, unspecified: Secondary | ICD-10-CM | POA: Diagnosis not present

## 2017-05-22 DIAGNOSIS — I10 Essential (primary) hypertension: Secondary | ICD-10-CM

## 2017-05-22 DIAGNOSIS — J189 Pneumonia, unspecified organism: Secondary | ICD-10-CM | POA: Diagnosis not present

## 2017-05-22 DIAGNOSIS — K219 Gastro-esophageal reflux disease without esophagitis: Secondary | ICD-10-CM

## 2017-05-22 DIAGNOSIS — N186 End stage renal disease: Secondary | ICD-10-CM | POA: Diagnosis not present

## 2017-05-22 DIAGNOSIS — D638 Anemia in other chronic diseases classified elsewhere: Secondary | ICD-10-CM | POA: Diagnosis not present

## 2017-05-22 NOTE — Progress Notes (Signed)
DATE:  05/22/2017   MRN:  [id number]  BIRTHDAY: 03-Oct-1940  Facility:  Nursing Home Location:  [facility] [place] Room Number: 530-Y  LEVEL OF CARE:  SNF (31)  Contact Information    Name Relation Home Work [place] Daughter [phone]  [phone]   Bonaparte,Lewis Brother   [phone]       Code Status History    Date Active Date Inactive Code Status Order ID Comments User Context   04/24/2017  8:00 PM 04/26/2017 11:01 PM Full Code [id number]  Modena Jansky, MD Inpatient   03/13/2016  3:32 AM 03/14/2016  8:20 PM Full Code [id number]  Toy Baker, MD ED   02/18/2016  2:29 AM 03/01/2016 10:37 PM Full Code [id number]  Toy Baker, MD ED   09/02/2015 12:51 AM 09/09/2015  1:52 AM Full Code [id number]  Allyne Gee, MD Inpatient   04/10/2015 10:22 PM 04/13/2015  7:07 PM Full Code [id number]  Gennaro Africa, MD Inpatient   10/16/2014  4:45 PM 10/19/2014  8:59 PM Full Code [id number]  Corky Sox, MD Inpatient   03/17/2014  1:24 PM 03/18/2014  4:59 PM Full Code [id number]  Markus Daft, MD Inpatient   03/11/2014  8:01 PM 03/17/2014  1:24 PM Full Code [id number]  Etta Quill, DO ED       Chief Complaint  Patient presents with  . Medical Management of Chronic Issues    Routine visit    HISTORY OF PRESENT ILLNESS:  This is a 16-YO female seen for a routine visit.  She is a long-term care resident at [facility]. 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 She was seen in the room today. Noted to have nasal voice. She reported having productive cough with whitish phlegm. Chest x-ray report showed interval development of right perihilar atelectasis/infiltrate. She was given Rocephin 1 gm IM X 1 yesterday. She was sent to the hospital last month for HCAP. No fever has been reported.     PAST MEDICAL HISTORY:  Past Medical History:    Diagnosis Date  . Anxiety   . Closed fracture of right distal femur ([place]) 09/01/2015  . Depression   . ESRD (end stage renal disease) on dialysis [facility])    "TTS; [facility]" (04/24/2017)  . GERD (gastroesophageal reflux disease) 04/23/2015  . HCAP (healthcare-associated pneumonia) 04/24/2017  . Heart murmur   . History of blood transfusion    "low HgB when I was going to dialysis center"  . Hypertension   . Hypertension associatd with end stage renal disease on dialysis 03/11/2014  . Malnutrition of moderate degree 09/02/2015  . Multiple myeloma ([facility]) 03/11/2014  . Patient is Jehovah's Witness    "I'd rather never have any blood transfusions unless absolutely necessary; please check with me 1st". (04/24/2017)  . Pneumonia ~ 2016     CURRENT MEDICATIONS: Reviewed  Patient's Medications  New Prescriptions   No medications on file  Previous Medications   AMLODIPINE (NORVASC) 10 MG TABLET    Take 10 mg by mouth See admin instructions. HOLD UNTIL AFTER DIALYSIS ON TUES/THURS/SAT AND HOLD FOR SYSTOLIC B/P READING OF 909 OR LESS   BISACODYL (BISAC-EVAC) 10 MG SUPPOSITORY    Place 10 mg rectally once as needed (FOR CONSTIPATION NOT RELIEVED BY MILK OF MAGNESIA).    CALCIUM ACETATE (PHOSLO) 667 MG CAPSULE    Take 1,334 mg  by mouth 3 (three) times daily.    CINACALCET (SENSIPAR) 60 MG TABLET    Take 60 mg by mouth daily.   CLONIDINE (CATAPRES) 0.1 MG TABLET    Take 0.1 mg by mouth every 8 (eight) hours as needed (for a systolic B/P of 563 or greater).    DEXTROMETHORPHAN HBR PO    Take 20 mg by mouth every 8 (eight) hours as needed (for cough).    GUAIFENESIN (MUCINEX) 600 MG 12 HR TABLET    Take 600 mg by mouth 2 (two) times daily.   HYDROCORTISONE CREAM 1 %    Apply 1 application topically daily as needed (for hemorrhoids).    ISOSORBIDE MONONITRATE (IMDUR) 60 MG 24 HR TABLET    Take 60 mg by mouth daily.    LAMOTRIGINE (LAMICTAL) 25 MG TABLET    Take 50 mg by mouth daily.    LOPERAMIDE  (IMODIUM A-D) 2 MG TABLET    Take 2 mg by mouth See admin instructions. AFTER EACH LOOSE STOOL/NOT TO EXCEED 6 TABLETS IN 24 HOURS FOR 2 DAYS (MAY ALSO TAKE 4 MG AS NEEDED PRE-DIALYSIS)   LORAZEPAM (ATIVAN) 0.5 MG TABLET    Take 1 tablet (0.5 mg total) by mouth at bedtime.   MAGNESIUM HYDROXIDE (MILK OF MAGNESIA) 400 MG/5ML SUSPENSION    Take 15-30 mLs by mouth once as needed for mild constipation.   MULTIVITAMIN (RENA-VIT) TABS TABLET    Take 1 tablet by mouth daily.   ONDANSETRON (ZOFRAN ODT) 4 MG DISINTEGRATING TABLET    Take 1 tablet (4 mg total) by mouth every 8 (eight) hours as needed for nausea or vomiting.   PROMETHAZINE (PHENERGAN) 25 MG TABLET    Take 25 mg by mouth every 6 (six) hours as needed for nausea or vomiting.   PSEUDOEPHEDRINE-ACETAMINOPHEN (CEPACOL SORE THROAT PO)    Take 1 lozenge by mouth 2 (two) times daily.   RANITIDINE (ZANTAC) 300 MG TABLET    Take 300 mg by mouth at bedtime. Reported on 03/05/2016   SACCHAROMYCES BOULARDII (FLORASTOR) 250 MG CAPSULE    Take 250 mg by mouth 2 (two) times daily. x10 days   SENNOSIDES-DOCUSATE SODIUM (SENOKOT-S) 8.6-50 MG TABLET    Take 2 tablets by mouth at bedtime.   SERTRALINE (ZOLOFT) 50 MG TABLET    Take 50 mg by mouth daily.    SEVELAMER CARBONATE (RENVELA) 800 MG TABLET    Take 1,600 mg by mouth 3 (three) times daily with meals.    SIMETHICONE (MYLICON) 893 MG CHEWABLE TABLET    Take 1 tablet by mouth prior to each meal.   SODIUM PHOSPHATES (RA SALINE ENEMA) 19-7 GM/118ML ENEM    Place 1 enema rectally once as needed (if constipation not relieved by Dulcolax suppository). CALL DOCTOR IF NO RELIEF FROM THIS   VALACYCLOVIR (VALTREX) 500 MG TABLET    Take 1 tablet (500 mg total) by mouth every other day.  Modified Medications   No medications on file  Discontinued Medications   No medications on file     No Known Allergies   REVIEW OF SYSTEMS:  GENERAL:  no fever, chills or weakness MOUTH and THROAT: Denies oral discomfort,  gingival pain or bleeding, pain from teeth or hoarseness   RESPIRATORY: + cough CARDIAC: no chest pain, edema or palpitations GI: no abdominal pain, diarrhea, constipation, heart burn, nausea or vomiting PSYCHIATRIC: Denies feeling of depression or anxiety. No report of hallucinations, insomnia, paranoia, or agitation    PHYSICAL EXAMINATION  GENERAL APPEARANCE: Well nourished. In no acute distress. Normal body habitus SKIN:  Skin is warm and dry.  MOUTH and THROAT: Lips are without lesions. Oral mucosa is moist and without lesions. Tongue is normal in shape, size, and color and without lesions RESPIRATORY: breathing is even & unlabored, BS CTAB CARDIAC: RRR, no murmur,no extra heart sounds, no edema GI: abdomen soft, normal BS, no masses, no tenderness, no hepatomegaly, no splenomegaly EXTREMITIES:  Able to move X 4 extremities, left upper arm AV fistula +bruit/thrill PSYCHIATRIC: Alert and oriented X 3. Affect and behavior are appropriate    LABS/RADIOLOGY: Labs reviewed: Basic Metabolic Panel:  Recent Labs  04/24/17 1158 04/25/17 0730 04/26/17 0417  NA 134* 133* 136  K 4.9 5.8* 4.3  CL 96* 98* 100*  CO2 23 21* 28  GLUCOSE 88 72 73  BUN 59* 72* 22*  CREATININE 8.60* 9.82* 4.63*  CALCIUM 8.7* 7.5* 8.2*   Liver Function Tests:  Recent Labs  04/23/17 04/24/17 1158  AST 16 96*  ALT 8 26  ALKPHOS 117 112  BILITOT  --  0.9  PROT  --  7.6  ALBUMIN  --  3.2*   CBC:  Recent Labs  01/16/17 1725 04/23/17 04/24/17 1158 04/25/17 0730 04/26/17 0417  WBC 3.5* 5.6 5.3 8.6 10.2  NEUTROABS 1.8 5 5.0  --   --   HGB 11.5* 9.5* 9.8* 9.2* 9.3*  HCT 36.3 30* 30.2* 29.2* 28.8*  MCV 107.4*  --  100.3* 101.4* 99.3  PLT 140* 119* 81* 72* 60*   Cardiac Enzymes:  Recent Labs  04/24/17 2057 04/25/17 0246 04/25/17 0730  TROPONINI 1.64* 0.97* 0.62*    Dg Chest 2 View  Result Date: 04/24/2017 CLINICAL DATA:  Shortness of breath. EXAM: CHEST  2 VIEW COMPARISON:   Radiographs of Feb 15, 2017. FINDINGS: Stable cardiomegaly. No pneumothorax or significant pleural effusion is noted. Left lung is clear. Mild right basilar subsegmental atelectasis is noted. Bony thorax is unremarkable. IMPRESSION: Mild right basilar subsegmental atelectasis. Electronically Signed   By: Marijo Conception, M.D.   On: 04/24/2017 15:48    ASSESSMENT/PLAN:  1. HCAP (healthcare-associated pneumonia) - will start on Doxycycline 100 mg 1 tab PO BID X 7 days and Florastor 250 mg 1 capsule PO BID X 10 days, talked to speech therapy to screen patient for possible aspiration pneumonia, continue Mucinex 600 mg 1 tab PO BID for a total of 7 days   2. Essential hypertension - continue Amlodipine 10 mg 1 tab daily and Clonidine 0.1 mg 1 tab Q 8 hours PRN   3. Anemia of chronic disease - stable Lab Results  Component Value Date   HGB 9.3 (L) 04/26/2017     4. ESRD (end stage renal disease) ([place]) - continue hemodialysis Q TThSat,   5. Gastroesophageal reflux disease without esophagitis - stable, continue Ranitidine 300 mg 1 tab Q HS   6. Chronic depression -  Stable, continue Lamotrigine 25 mg daily, follows-up with [facility] NP      Goals of care:  Long-term care     Monina C. Belva - NP    [employer] [phone]

## 2017-06-05 ENCOUNTER — Other Ambulatory Visit: Payer: Self-pay

## 2017-06-05 MED ORDER — LORAZEPAM 0.5 MG PO TABS: 0.5000 mg | ORAL_TABLET | Freq: Every day | ORAL | 0 refills | Status: DC

## 2017-06-05 NOTE — Telephone Encounter (Signed)
Order faxed to [facility] [phone]

## 2017-06-06 ENCOUNTER — Non-Acute Institutional Stay (SKILLED_NURSING_FACILITY): Payer: Medicare Other

## 2017-06-06 DIAGNOSIS — Z0000 Encounter for general adult medical examination without abnormal findings: Secondary | ICD-10-CM | POA: Diagnosis not present

## 2017-06-06 NOTE — Progress Notes (Addendum)
Subjective:   [name] is a 76 y.o. female who presents for Medicare Annual (Subsequent) preventive examination at [facility]  Last AWV-06/17/16    Objective:     Vitals: BP (!) 180/80 (BP Location: Right Arm, Patient Position: Sitting) Comment: Before medications  Pulse 70   Temp (!) 97.3 F (36.3 C) (Oral)   Ht _0  (1.702 m)   Wt 145 lb (65.8 kg)   SpO2 99%   BMI 22.71 kg/m   Body mass index is 22.71 kg/m.   Tobacco History  Smoking Status  . Never Smoker  Smokeless Tobacco  . Never Used     Counseling given: Not Answered   Past Medical History:  Diagnosis Date  . Anxiety   . Closed fracture of right distal femur ([place]) 09/01/2015  . Depression   . ESRD (end stage renal disease) on dialysis [facility])    "TTS; [place] [facility]" (04/24/2017)  . GERD (gastroesophageal reflux disease) 04/23/2015  . HCAP (healthcare-associated pneumonia) 04/24/2017  . Heart murmur   . History of blood transfusion    "low HgB when I was going to dialysis center"  . Hypertension   . Hypertension associatd with end stage renal disease on dialysis 03/11/2014  . Malnutrition of moderate degree 09/02/2015  . Multiple myeloma ([place]) 03/11/2014  . Patient is Jehovah's Witness    "I'd rather never have any blood transfusions unless absolutely necessary; please check with me 1st". (04/24/2017)  . Pneumonia ~ 2016   Past Surgical History:  Procedure Laterality Date  . AV FISTULA PLACEMENT Left   . I&D EXTREMITY Left 02/27/2016   Procedure: ARTHROSCOPIC IRRIGATION AND DEBRIDEMENT EXTREMITY;  Surgeon: Renette Butters, MD;  Location: [facility];  Service: Orthopedics;  Laterality: Left;  . TEE WITHOUT CARDIOVERSION N/A 02/29/2016   Procedure: TRANSESOPHAGEAL ECHOCARDIOGRAM (TEE);  Surgeon: Thayer Headings, MD;  Location: [facility] ENDOSCOPY;  Service: Cardiovascular;  Laterality: N/A;   Family History  Problem Relation Age of Onset  . Hypertension Mother   . Hypertension Father    History  Sexual  Activity  . Sexual activity: No    Outpatient Encounter Prescriptions as of 06/06/2017  Medication Sig  . acetaminophen (TYLENOL) 325 MG tablet Take 650 mg by mouth every 6 (six) hours as needed.  Marland Kitchen amLODipine (NORVASC) 10 MG tablet Take 10 mg by mouth See admin instructions. HOLD UNTIL AFTER DIALYSIS ON TUES/THURS/SAT AND HOLD FOR SYSTOLIC B/P READING OF 245 OR LESS  . bisacodyl (BISAC-EVAC) 10 MG suppository Place 10 mg rectally once as needed (FOR CONSTIPATION NOT RELIEVED BY MILK OF MAGNESIA).   . calcium acetate (PHOSLO) 667 MG capsule Take 1,334 mg by mouth 3 (three) times daily.   . cloNIDine (CATAPRES) 0.1 MG tablet Take 0.1 mg by mouth every 8 (eight) hours as needed (for a systolic B/P of 809 or greater).   . DEXTROMETHORPHAN HBR PO Take 20 mg by mouth every 8 (eight) hours as needed (for cough).   . hydrocortisone cream 1 % Apply 1 application topically daily as needed (for hemorrhoids).   . isosorbide mononitrate (IMDUR) 60 MG 24 hr tablet Take 60 mg by mouth daily.   Marland Kitchen lamoTRIgine (LAMICTAL) 25 MG tablet Take 50 mg by mouth daily.   Marland Kitchen loperamide (IMODIUM A-D) 2 MG tablet Take 2 mg by mouth See admin instructions. AFTER EACH LOOSE STOOL/NOT TO EXCEED 6 TABLETS IN 24 HOURS FOR 2 DAYS (MAY ALSO TAKE 4 MG AS NEEDED PRE-DIALYSIS)  . LORazepam (ATIVAN)  0.5 MG tablet Take 1 tablet (0.5 mg total) by mouth at bedtime.  . magnesium hydroxide (MILK OF MAGNESIA) 400 MG/5ML suspension Take 15-30 mLs by mouth once as needed for mild constipation.  . multivitamin (RENA-VIT) TABS tablet Take 1 tablet by mouth daily.  . ondansetron (ZOFRAN ODT) 4 MG disintegrating tablet Take 1 tablet (4 mg total) by mouth every 8 (eight) hours as needed for nausea or vomiting.  . promethazine (PHENERGAN) 25 MG tablet Take 25 mg by mouth every 6 (six) hours as needed for nausea or vomiting.  . ranitidine (ZANTAC) 300 MG tablet Take 300 mg by mouth at bedtime. Reported on 03/05/2016  . sennosides-docusate sodium  (SENOKOT-S) 8.6-50 MG tablet Take 2 tablets by mouth at bedtime.  . sertraline (ZOLOFT) 50 MG tablet Take 50 mg by mouth daily.   . sevelamer carbonate (RENVELA) 800 MG tablet Take 1,600 mg by mouth 3 (three) times daily with meals.   . simethicone (MYLICON) 638 MG chewable tablet Take 1 tablet by mouth prior to each meal.  . Sodium Phosphates (RA SALINE ENEMA) 19-7 GM/118ML ENEM Place 1 enema rectally once as needed (if constipation not relieved by Dulcolax suppository). CALL DOCTOR IF NO RELIEF FROM THIS  . valACYclovir (VALTREX) 500 MG tablet Take 1 tablet (500 mg total) by mouth every other day.  . cinacalcet (SENSIPAR) 60 MG tablet Take 60 mg by mouth daily.  . [DISCONTINUED] saccharomyces boulardii (FLORASTOR) 250 MG capsule Take 250 mg by mouth 2 (two) times daily. x10 days   No facility-administered encounter medications on file as of 06/06/2017.     Activities of Daily Living In your present state of health, do you have any difficulty performing the following activities: 06/06/2017 04/24/2017  Hearing? Y N  Vision? Y Y  Comment - "sometimes"  Difficulty concentrating or making decisions? N N  Walking or climbing stairs? N Y  Dressing or bathing? Y Y  Doing errands, shopping? Tempie Donning  Preparing Food and eating ? Y -  Using the Toilet? N -  In the past six months, have you accidently leaked urine? N -  Do you have problems with loss of bowel control? N -  Managing your Medications? Y -  Managing your Finances? Y -  Housekeeping or managing your Housekeeping? Y -  Some recent data might be hidden    Patient Care Team: Hendricks Limes, MD as PCP - General (Internal Medicine)    Assessment:     Exercise Activities and Dietary recommendations Current Exercise Habits: The patient does not participate in regular exercise at present, Exercise limited by: None identified  Goals    None     Fall Risk Fall Risk  06/06/2017 11/09/2016 06/15/2016 05/25/2016 05/23/2016  Falls in the past  year? _0   Number falls in past yr: - - - - -  Injury with Fall? - - - - -  Risk Factor Category  - - - - -  Risk for fall due to : - - - - -  Risk for fall due to: Comment - - - - -  Follow up - - - - -   Depression Screen PHQ 2/9 Scores 06/06/2017 03/28/2016 02/16/2015 01/24/2015  PHQ - 2 Score 0 - 0 0  Exception Documentation - Other- indicate reason in comment box - -  Not completed - Patient at nursing facility. - -     Cognitive Function     6CIT Screen 06/06/2017  What  Year? 0 points  What month? 0 points  What time? 0 points  Count back from 20 0 points  Months in reverse 0 points  Repeat phrase 0 points  Total Score 0    Immunization History  Administered Date(s) Administered  . Influenza,inj,Quad PF,6+ Mos 07/23/2014, 08/01/2016  . PPD Test 04/13/2015   Screening Tests Health Maintenance  Topic Date Due  . INFLUENZA VACCINE  07/01/2017 (Originally 05/01/2017)  . PNA vac Low Risk Adult (1 of 2 - PCV13) 07/01/2017 (Originally 03/20/2004)  . DEXA SCAN  11/28/2018 (Originally 03/20/2004)  . TETANUS/TDAP  11/29/2023 (Originally 03/20/1960)      Plan:    I have personally reviewed and addressed the Medicare Annual Wellness questionnaire and have noted the following in the patient's chart:  A. Medical and social history B. Use of alcohol, tobacco or illicit drugs  C. Current medications and supplements D. Functional ability and status E.  Nutritional status F.  Physical activity G. Advance directives H. List of other physicians I.  Hospitalizations, surgeries, and ER visits in previous 12 months J.  [place] to include hearing, vision, cognitive, depression L. Referrals and appointments - none  In addition, I have reviewed and discussed with patient certain preventive protocols, quality metrics, and best practice recommendations. A written personalized care plan for preventive services as well as general preventive health recommendations were  provided to patient.  See attached scanned questionnaire for additional information.   Signed,   Rich Reining, RN Nurse Health Advisor   Quick Notes   Health Maintenance: Prevnar, TDAP and DEXA due. Will discuss vaccines with oncologist before ordering.     Abnormal Screen: 6 CIT-0, BP 180/80 (before medications)     Patient Concerns: None     Nurse Concerns: None  I have personally reviewed the health advisor's clinical note, was available for consultation, and agree with the assessment and plan as written. Hendricks Limes M.D., FACP, [facility]

## 2017-06-06 NOTE — Patient Instructions (Addendum)
[name] , Thank you for taking time to come for your Medicare Wellness Visit. I appreciate your ongoing commitment to your health goals. Please review the following plan we discussed and let me know if I can assist you in the future.   Screening recommendations/referrals: Colonoscopy excluded, pt over age 78 Mammogram excluded, pt over age 38 Bone Density due Recommended yearly ophthalmology/optometry visit for glaucoma screening and checkup Recommended yearly dental visit for hygiene and checkup  Vaccinations: Influenza vaccine due-Will discuss vaccines with oncologist before ordering. Pneumococcal vaccine 13 due-Will discuss vaccines with oncologist before ordering. Tdap vaccine due-Will discuss vaccines with oncologist before ordering. Shingles vaccine not in records  Advanced directives: Need a copy for chart  Conditions/risks identified: multiple myeloma.  Next appointment: Dr. Linna Darner makes rounds   Preventive Care 78 Years and Older, Female Preventive care refers to lifestyle choices and visits with your health care provider that can promote health and wellness. What does preventive care include?  A yearly physical exam. This is also called an annual well check.  Dental exams once or twice a year.  Routine eye exams. Ask your health care provider how often you should have your eyes checked.  Personal lifestyle choices, including:  Daily care of your teeth and gums.  Regular physical activity.  Eating a healthy diet.  Avoiding tobacco and drug use.  Limiting alcohol use.  Practicing safe sex.  Taking low-dose aspirin every day.  Taking vitamin and mineral supplements as recommended by your health care provider. What happens during an annual well check? The services and screenings done by your health care provider during your annual well check will depend on your age, overall health, lifestyle risk factors, and family history of disease. Counseling  Your  health care provider may ask you questions about your:  Alcohol use.  Tobacco use.  Drug use.  Emotional well-being.  Home and relationship well-being.  Sexual activity.  Eating habits.  History of falls.  Memory and ability to understand (cognition).  Work and work Statistician.  Reproductive health. Screening  You may have the following tests or measurements:  Height, weight, and BMI.  Blood pressure.  Lipid and cholesterol levels. These may be checked every 5 years, or more frequently if you are over 63 years old.  Skin check.  Lung cancer screening. You may have this screening every year starting at age 58 if you have a 30-pack-year history of smoking and currently smoke or have quit within the past 15 years.  Fecal occult blood test (FOBT) of the stool. You may have this test every year starting at age 56.  Flexible sigmoidoscopy or colonoscopy. You may have a sigmoidoscopy every 5 years or a colonoscopy every 10 years starting at age 28.  Hepatitis C blood test.  Hepatitis B blood test.  Sexually transmitted disease (STD) testing.  Diabetes screening. This is done by checking your blood sugar (glucose) after you have not eaten for a while (fasting). You may have this done every 1-3 years.  Bone density scan. This is done to screen for osteoporosis. You may have this done starting at age 66.  Mammogram. This may be done every 1-2 years. Talk to your health care provider about how often you should have regular mammograms. Talk with your health care provider about your test results, treatment options, and if necessary, the need for more tests. Vaccines  Your health care provider may recommend certain vaccines, such as:  Influenza vaccine. This is recommended every year.  Tetanus, diphtheria, and acellular pertussis (Tdap, Td) vaccine. You may need a Td booster every 10 years.  Zoster vaccine. You may need this after age 43.  Pneumococcal 13-valent  conjugate (PCV13) vaccine. One dose is recommended after age 68.  Pneumococcal polysaccharide (PPSV23) vaccine. One dose is recommended after age 17. Talk to your health care provider about which screenings and vaccines you need and how often you need them. This information is not intended to replace advice given to you by your health care provider. Make sure you discuss any questions you have with your health care provider. Document Released: 10/14/2015 Document Revised: 06/06/2016 Document Reviewed: 07/19/2015 Elsevier Interactive Patient Education  2017 [place] Prevention in the Home Falls can cause injuries. They can happen to people of all ages. There are many things you can do to make your home safe and to help prevent falls. What can I do on the outside of my home?  Regularly fix the edges of walkways and driveways and fix any cracks.  Remove anything that might make you trip as you walk through a door, such as a raised step or threshold.  Trim any bushes or trees on the path to your home.  Use bright outdoor lighting.  Clear any walking paths of anything that might make someone trip, such as rocks or tools.  Regularly check to see if handrails are loose or broken. Make sure that both sides of any steps have handrails.  Any raised decks and porches should have guardrails on the edges.  Have any leaves, snow, or ice cleared regularly.  Use sand or salt on walking paths during winter.  Clean up any spills in your garage right away. This includes oil or grease spills. What can I do in the bathroom?  Use night lights.  Install grab bars by the toilet and in the tub and shower. Do not use towel bars as grab bars.  Use non-skid mats or decals in the tub or shower.  If you need to sit down in the shower, use a plastic, non-slip stool.  Keep the floor dry. Clean up any water that spills on the floor as soon as it happens.  Remove soap buildup in the tub or  shower regularly.  Attach bath mats securely with double-sided non-slip rug tape.  Do not have throw rugs and other things on the floor that can make you trip. What can I do in the bedroom?  Use night lights.  Make sure that you have a light by your bed that is easy to reach.  Do not use any sheets or blankets that are too big for your bed. They should not hang down onto the floor.  Have a firm chair that has side arms. You can use this for support while you get dressed.  Do not have throw rugs and other things on the floor that can make you trip. What can I do in the kitchen?  Clean up any spills right away.  Avoid walking on wet floors.  Keep items that you use a lot in easy-to-reach places.  If you need to reach something above you, use a strong step stool that has a grab bar.  Keep electrical cords out of the way.  Do not use floor polish or wax that makes floors slippery. If you must use wax, use non-skid floor wax.  Do not have throw rugs and other things on the floor that can make you trip. What can I do  with my stairs?  Do not leave any items on the stairs.  Make sure that there are handrails on both sides of the stairs and use them. Fix handrails that are broken or loose. Make sure that handrails are as long as the stairways.  Check any carpeting to make sure that it is firmly attached to the stairs. Fix any carpet that is loose or worn.  Avoid having throw rugs at the top or bottom of the stairs. If you do have throw rugs, attach them to the floor with carpet tape.  Make sure that you have a light switch at the top of the stairs and the bottom of the stairs. If you do not have them, ask someone to add them for you. What else can I do to help prevent falls?  Wear shoes that:  Do not have high heels.  Have rubber bottoms.  Are comfortable and fit you well.  Are closed at the toe. Do not wear sandals.  If you use a stepladder:  Make sure that it is fully  opened. Do not climb a closed stepladder.  Make sure that both sides of the stepladder are locked into place.  Ask someone to hold it for you, if possible.  Clearly mark and make sure that you can see:  Any grab bars or handrails.  First and last steps.  Where the edge of each step is.  Use tools that help you move around (mobility aids) if they are needed. These include:  Canes.  Walkers.  Scooters.  Crutches.  Turn on the lights when you go into a dark area. Replace any light bulbs as soon as they burn out.  Set up your furniture so you have a clear path. Avoid moving your furniture around.  If any of your floors are uneven, fix them.  If there are any pets around you, be aware of where they are.  Review your medicines with your doctor. Some medicines can make you feel dizzy. This can increase your chance of falling. Ask your doctor what other things that you can do to help prevent falls. This information is not intended to replace advice given to you by your health care provider. Make sure you discuss any questions you have with your health care provider. Document Released: 07/14/2009 Document Revised: 02/23/2016 Document Reviewed: 10/22/2014 Elsevier Interactive Patient Education  2017 [employer].

## 2017-06-07 ENCOUNTER — Ambulatory Visit (HOSPITAL_COMMUNITY): Admission: RE | Admit: 2017-06-07 | Payer: Medicare Other | Source: Ambulatory Visit

## 2017-06-19 ENCOUNTER — Encounter: Payer: Self-pay | Admitting: Adult Health

## 2017-06-19 ENCOUNTER — Non-Acute Institutional Stay (SKILLED_NURSING_FACILITY): Payer: Medicare Other | Admitting: Adult Health

## 2017-06-19 DIAGNOSIS — Z992 Dependence on renal dialysis: Secondary | ICD-10-CM | POA: Diagnosis not present

## 2017-06-19 DIAGNOSIS — C9000 Multiple myeloma not having achieved remission: Secondary | ICD-10-CM | POA: Diagnosis not present

## 2017-06-19 DIAGNOSIS — I120 Hypertensive chronic kidney disease with stage 5 chronic kidney disease or end stage renal disease: Secondary | ICD-10-CM | POA: Diagnosis not present

## 2017-06-19 DIAGNOSIS — F419 Anxiety disorder, unspecified: Secondary | ICD-10-CM | POA: Diagnosis not present

## 2017-06-19 DIAGNOSIS — N186 End stage renal disease: Secondary | ICD-10-CM | POA: Diagnosis not present

## 2017-06-19 DIAGNOSIS — D701 Agranulocytosis secondary to cancer chemotherapy: Secondary | ICD-10-CM | POA: Diagnosis not present

## 2017-06-19 DIAGNOSIS — F32A Depression, unspecified: Secondary | ICD-10-CM

## 2017-06-19 DIAGNOSIS — I209 Angina pectoris, unspecified: Secondary | ICD-10-CM

## 2017-06-19 DIAGNOSIS — I25119 Atherosclerotic heart disease of native coronary artery with unspecified angina pectoris: Secondary | ICD-10-CM | POA: Diagnosis not present

## 2017-06-19 DIAGNOSIS — F329 Major depressive disorder, single episode, unspecified: Secondary | ICD-10-CM | POA: Diagnosis not present

## 2017-06-19 DIAGNOSIS — T451X5A Adverse effect of antineoplastic and immunosuppressive drugs, initial encounter: Secondary | ICD-10-CM

## 2017-06-19 NOTE — Progress Notes (Signed)
DATE:  06/19/2017 MRN:  [id number]  BIRTHDAY: Jul 17, 1941  Facility:  Nursing Home Location:  [facility] Room Number: 269-S  LEVEL OF CARE:  SNF (31)  Contact Information    Name Relation Home Work Thorp Daughter [phone]  [phone]   Kitko,Lewis Brother   [phone]       Code Status History    Date Active Date Inactive Code Status Order ID Comments User Context   04/24/2017  8:00 PM 04/26/2017 11:01 PM Full Code [id number]  Modena Jansky, MD Inpatient   03/13/2016  3:32 AM 03/14/2016  8:20 PM Full Code [id number]  Toy Baker, MD ED   02/18/2016  2:29 AM 03/01/2016 10:37 PM Full Code [id number]  Toy Baker, MD ED   09/02/2015 12:51 AM 09/09/2015  1:52 AM Full Code [id number]  Allyne Gee, MD Inpatient   04/10/2015 10:22 PM 04/13/2015  7:07 PM Full Code [id number]  Gennaro Africa, MD Inpatient   10/16/2014  4:45 PM 10/19/2014  8:59 PM Full Code [id number]  Corky Sox, MD Inpatient   03/17/2014  1:24 PM 03/18/2014  4:59 PM Full Code [id number]  Markus Daft, MD Inpatient   03/11/2014  8:01 PM 03/17/2014  1:24 PM Full Code [id number]  Etta Quill, DO ED       Chief Complaint  Patient presents with  . Medical Management of Chronic Issues    Routine visit    HISTORY OF PRESENT ILLNESS:  This is a 39-YO female seen for a routine visit.  She is a long-term care resident of [facility].  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  She was seen in her room today. She has an appointment today @ [facility] Hematology/Oncology for chemotherapy infusion. She has refused modified barium swallow which was scheduled due to her frequent pneumonias.    PAST MEDICAL HISTORY:  Past Medical History:  Diagnosis Date  . Anxiety   . Closed fracture of right distal femur ([place]) 09/01/2015  . Depression   . ESRD (end stage renal  disease) on dialysis [facility])    "TTS; [facility]" (04/24/2017)  . GERD (gastroesophageal reflux disease) 04/23/2015  . HCAP (healthcare-associated pneumonia) 04/24/2017  . Heart murmur   . History of blood transfusion    "low HgB when I was going to dialysis center"  . Hypertension   . Hypertension associatd with end stage renal disease on dialysis 03/11/2014  . Malnutrition of moderate degree 09/02/2015  . Multiple myeloma ([place]) 03/11/2014  . Patient is Jehovah's Witness    "I'd rather never have any blood transfusions unless absolutely necessary; please check with me 1st". (04/24/2017)  . Pneumonia ~ 2016     CURRENT MEDICATIONS: Reviewed  Patient's Medications  New Prescriptions   No medications on file  Previous Medications   ACETAMINOPHEN (TYLENOL) 325 MG TABLET    Take 650 mg by mouth every 6 (six) hours as needed.   AMLODIPINE (NORVASC) 10 MG TABLET    Take 10 mg by mouth See admin instructions. HOLD UNTIL AFTER DIALYSIS ON TUES/THURS/SAT AND HOLD FOR SYSTOLIC B/P READING OF 024 OR LESS   BISACODYL (BISAC-EVAC) 10 MG SUPPOSITORY    Place 10 mg rectally once as needed (FOR CONSTIPATION NOT RELIEVED BY MILK OF MAGNESIA).    CALCIUM ACETATE (PHOSLO) 667 MG CAPSULE    Take 1,334 mg by mouth 3 (three)  times daily.    CLONIDINE (CATAPRES) 0.1 MG TABLET    Take 0.1 mg by mouth every 8 (eight) hours as needed (for a systolic B/P of 193 or greater).    DEXTROMETHORPHAN HBR PO    Take 20 mg by mouth every 8 (eight) hours as needed (for cough).    HYDROCORTISONE CREAM 1 %    Apply 1 application topically daily as needed (for hemorrhoids).    ISOSORBIDE MONONITRATE (IMDUR) 60 MG 24 HR TABLET    Take 60 mg by mouth daily.    LAMOTRIGINE (LAMICTAL) 25 MG TABLET    Take 50 mg by mouth daily.    LOPERAMIDE (IMODIUM A-D) 2 MG TABLET    Take 2 mg by mouth See admin instructions. AFTER EACH LOOSE STOOL/NOT TO EXCEED 6 TABLETS IN 24 HOURS FOR 2 DAYS (MAY ALSO TAKE 4 MG AS NEEDED PRE-DIALYSIS)   LORAZEPAM  (ATIVAN) 0.5 MG TABLET    Take 1 tablet (0.5 mg total) by mouth at bedtime.   MAGNESIUM HYDROXIDE (MILK OF MAGNESIA) 400 MG/5ML SUSPENSION    Take 15-30 mLs by mouth once as needed for mild constipation.   MULTIVITAMIN (RENA-VIT) TABS TABLET    Take 1 tablet by mouth daily.   ONDANSETRON (ZOFRAN ODT) 4 MG DISINTEGRATING TABLET    Take 1 tablet (4 mg total) by mouth every 8 (eight) hours as needed for nausea or vomiting.   PROMETHAZINE (PHENERGAN) 25 MG TABLET    Take 25 mg by mouth every 6 (six) hours as needed for nausea or vomiting.   RANITIDINE (ZANTAC) 300 MG TABLET    Take 300 mg by mouth at bedtime. Reported on 03/05/2016   SENNOSIDES-DOCUSATE SODIUM (SENOKOT-S) 8.6-50 MG TABLET    Take 2 tablets by mouth at bedtime.   SERTRALINE (ZOLOFT) 50 MG TABLET    Take 50 mg by mouth daily.    SEVELAMER CARBONATE (RENVELA) 800 MG TABLET    Take 1,600 mg by mouth 3 (three) times daily with meals.    SIMETHICONE (MYLICON) 790 MG CHEWABLE TABLET    Take 1 tablet by mouth prior to each meal.   SODIUM PHOSPHATES (RA SALINE ENEMA) 19-7 GM/118ML ENEM    Place 1 enema rectally once as needed (if constipation not relieved by Dulcolax suppository). CALL DOCTOR IF NO RELIEF FROM THIS   VALACYCLOVIR (VALTREX) 500 MG TABLET    Take 1 tablet (500 mg total) by mouth every other day.  Modified Medications   No medications on file  Discontinued Medications   No medications on file     No Known Allergies   REVIEW OF SYSTEMS:  GENERAL: no fever, chills or weakness MOUTH and THROAT: Denies oral discomfort RESPIRATORY: no cough, SOB, DOE, wheezing, hemoptysis CARDIAC: no chest pain, edema or palpitations GI: no abdominal pain, diarrhea, constipation, heart burn, nausea or vomiting GU: Denies dysuria, frequency, hematuria, incontinence, or discharge PSYCHIATRIC: Denies feeling of depression or anxiety. No report of hallucinations, insomnia, paranoia, or agitation     PHYSICAL EXAMINATION  GENERAL  APPEARANCE: Well nourished. In no acute distress. Normal body habitus SKIN:  Skin is warm and dry.  HEAD: Normal in size and contour. No evidence of trauma MOUTH and THROAT: Lips are without lesions. Oral mucosa is moist and without lesions. Tongue is normal in shape, size, and color and without lesions RESPIRATORY: breathing is even & unlabored, BS CTAB CARDIAC: RRR, + murmur,no extra heart sounds, no edema GI: abdomen soft, normal BS, no masses, no tenderness, no  hepatomegaly, no splenomegaly EXTREMITIES:  Able to move X 4 extremities, has Left upper arm A-V Fistula + bruit/thrill PSYCHIATRIC: Alert and oriented X 3. Affect and behavior are appropriate    LABS/RADIOLOGY: Labs reviewed: Basic Metabolic Panel:  Recent Labs  04/24/17 1158 04/25/17 0730 04/26/17 0417  NA 134* 133* 136  K 4.9 5.8* 4.3  CL 96* 98* 100*  CO2 23 21* 28  GLUCOSE 88 72 73  BUN 59* 72* 22*  CREATININE 8.60* 9.82* 4.63*  CALCIUM 8.7* 7.5* 8.2*   Liver Function Tests:  Recent Labs  04/23/17 04/24/17 1158  AST 16 96*  ALT 8 26  ALKPHOS 117 112  BILITOT  --  0.9  PROT  --  7.6  ALBUMIN  --  3.2*   CBC:  Recent Labs  04/23/17 04/24/17 1158 04/25/17 0730 04/26/17 0417 05/21/17  WBC 5.6 5.3 8.6 10.2 3.6  NEUTROABS 5 5.0  --   --  2  HGB 9.5* 9.8* 9.2* 9.3* 8.3*  HCT 30* 30.2* 29.2* 28.8* 27*  MCV  --  100.3* 101.4* 99.3  --   PLT 119* 81* 72* 60* 161   Cardiac Enzymes:  Recent Labs  04/24/17 2057 04/25/17 0246 04/25/17 0730  TROPONINI 1.64* 0.97* 0.62*    ASSESSMENT/PLAN:   1. Multiple myeloma, remission status unspecified (HCC) -  Currently on chemotherapy with Cyclophosphamide PO, Bortezomib SQ, and Granix SQ, follows up with [facility]On[facility]   2. Chemotherapy induced neutropenia (HCC) - on Valacyclovir 500 mg Q other day, check CBC Lab Results  Component Value Date   WBC 3.6 05/21/2017     3. Hypertension associatd with end stage renal disease on dialysis -  Stable, continue amlodipine 10 mg Q TThSat, hemodialysis Q MWF   4. Chronic depression - stable, continue sertraline 50 mg 1 tab daily and lamotrigine 25 mg 1 tab daily, followed up by [facility] NP   5. Anxiety - mood is stable; continue Ativan 0.5 mg 1 tab every at bedtime   6. Coronary artery disease involving native coronary artery of native heart with angina pectoris (HCC) - no complaints of chest pain, continue isosorbide mononitrate ER 60 mg 1 tab daily     Goals of care:  Long-term care    Carinna Newhart C. Groton - NP    [employer] [phone]

## 2017-06-20 LAB — CBC AND DIFFERENTIAL
HEMATOCRIT: 30 — AB (ref 36–46)
HEMOGLOBIN: 9.3 — AB (ref 12.0–16.0)
Neutrophils Absolute: 9
PLATELETS: 179 (ref 150–399)
WBC: 10.1

## 2017-06-26 ENCOUNTER — Emergency Department (HOSPITAL_COMMUNITY)
Admission: EM | Admit: 2017-06-26 | Discharge: 2017-06-27 | Disposition: A | Discharge status: Home / Self Care | Payer: Medicare Other | Attending: Emergency Medicine | Admitting: Emergency Medicine

## 2017-06-26 ENCOUNTER — Emergency Department (HOSPITAL_COMMUNITY): Payer: Medicare Other

## 2017-06-26 ENCOUNTER — Encounter (HOSPITAL_COMMUNITY): Payer: Self-pay

## 2017-06-26 DIAGNOSIS — N186 End stage renal disease: Secondary | ICD-10-CM | POA: Diagnosis not present

## 2017-06-26 DIAGNOSIS — Z992 Dependence on renal dialysis: Secondary | ICD-10-CM | POA: Insufficient documentation

## 2017-06-26 DIAGNOSIS — M7989 Other specified soft tissue disorders: Secondary | ICD-10-CM | POA: Insufficient documentation

## 2017-06-26 DIAGNOSIS — E875 Hyperkalemia: Secondary | ICD-10-CM | POA: Diagnosis not present

## 2017-06-26 DIAGNOSIS — I120 Hypertensive chronic kidney disease with stage 5 chronic kidney disease or end stage renal disease: Secondary | ICD-10-CM | POA: Diagnosis not present

## 2017-06-26 DIAGNOSIS — R600 Localized edema: Secondary | ICD-10-CM | POA: Diagnosis present

## 2017-06-26 DIAGNOSIS — Z79899 Other long term (current) drug therapy: Secondary | ICD-10-CM | POA: Diagnosis not present

## 2017-06-26 LAB — CBC WITH DIFFERENTIAL/PLATELET
BASOS ABS: 0 10*3/uL (ref 0.0–0.1)
Basophils Relative: 0 %
EOS ABS: 0.4 10*3/uL (ref 0.0–0.7)
Eosinophils Relative: 9 %
HCT: 31.3 % — ABNORMAL LOW (ref 36.0–46.0)
Hemoglobin: 9.9 g/dL — ABNORMAL LOW (ref 12.0–15.0)
LYMPHS ABS: 0.8 10*3/uL (ref 0.7–4.0)
Lymphocytes Relative: 20 %
MCH: 33.4 pg (ref 26.0–34.0)
MCHC: 31.6 g/dL (ref 30.0–36.0)
MCV: 105.7 fL — ABNORMAL HIGH (ref 78.0–100.0)
MONO ABS: 0.4 10*3/uL (ref 0.1–1.0)
Monocytes Relative: 9 %
NEUTROS PCT: 62 %
Neutro Abs: 2.5 10*3/uL (ref 1.7–7.7)
PLATELETS: 165 10*3/uL (ref 150–400)
RBC: 2.96 MIL/uL — AB (ref 3.87–5.11)
RDW: 19.4 % — ABNORMAL HIGH (ref 11.5–15.5)
WBC: 4.1 10*3/uL (ref 4.0–10.5)

## 2017-06-26 LAB — BRAIN NATRIURETIC PEPTIDE: B NATRIURETIC PEPTIDE 5: 537.8 pg/mL — AB (ref 0.0–100.0)

## 2017-06-26 NOTE — ED Provider Notes (Signed)
[facility] DEPT Provider Note   CSN: [id number] Arrival date & time: 06/26/17  1622     History   Chief Complaint Chief Complaint  Patient presents with  . Leg Swelling    HPI [name] is a 76 y.o. female.  A shunt presents to the emergency department with chief complaint of bilateral lower extremity swelling. She reports that she missed dialysis yesterday, and ecchymosis to the cause of her leg swelling. She states that she did not go to dialysis because she was concerned that her blood pressure was running high at 466 systolic. She denies any chest pain or shortness of breath. Reports some pain in her bilateral lower extremities.  She denies any fevers or chills.  Denies any other associated symptoms.  She is mostly immobile, but can walk with a walker, which worsens her leg pain.  The leg pain is also worse with palpation.   The history is provided by the patient. No language interpreter was used.    Past Medical History:  Diagnosis Date  . Anxiety   . Closed fracture of right distal femur ([place]) 09/01/2015  . Depression   . ESRD (end stage renal disease) on dialysis [facility])    "TTS; [facility]" (04/24/2017)  . GERD (gastroesophageal reflux disease) 04/23/2015  . HCAP (healthcare-associated pneumonia) 04/24/2017  . Heart murmur   . History of blood transfusion    "low HgB when I was going to dialysis center"  . Hypertension   . Hypertension associatd with end stage renal disease on dialysis 03/11/2014  . Malnutrition of moderate degree 09/02/2015  . Multiple myeloma ([place]) 03/11/2014  . Patient is Jehovah's Witness    "I'd rather never have any blood transfusions unless absolutely necessary; please check with me 1st". (04/24/2017)  . Pneumonia ~ 2016    Patient Active Problem List   Diagnosis Date Noted  . HCAP (healthcare-associated pneumonia) 04/24/2017  . Sepsis ([place]) 04/24/2017  . Nausea and vomiting 04/24/2017  . Thrombocytopenia ([place]) 04/24/2017  .  Hyperkalemia 01/16/2017  . IBS (irritable bowel syndrome) 12/27/2016  . Mood disorder ([place]) 07/19/2016  . Diastolic dysfunction 59/93/5701  . Insomnia 04/27/2016  . Hyponatremia 03/12/2016  . Symptomatic anemia 03/12/2016  . Diarrhea 03/12/2016  . Essential hypertension   . Swelling of joint of left knee 02/18/2016  . Leukopenia due to antineoplastic chemotherapy ([place]) 09/03/2015  . Malnutrition of moderate degree 09/02/2015  . GERD (gastroesophageal reflux disease) 04/23/2015  . 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 Multiple myeloma ([place]) 03/11/2014  . Hypertension associatd with end stage renal disease on dialysis 03/11/2014    Past Surgical History:  Procedure Laterality Date  . AV FISTULA PLACEMENT Left   . I&D EXTREMITY Left 02/27/2016   Procedure: ARTHROSCOPIC IRRIGATION AND DEBRIDEMENT EXTREMITY;  Surgeon: Renette Butters, MD;  Location: [facility];  Service: Orthopedics;  Laterality: Left;  . TEE WITHOUT CARDIOVERSION N/A 02/29/2016   Procedure: TRANSESOPHAGEAL ECHOCARDIOGRAM (TEE);  Surgeon: Thayer Headings, MD;  Location: [facility];  Service: Cardiovascular;  Laterality: N/A;    OB History    No data available       Home Medications    Prior to Admission medications   Medication Sig Start Date End Date Taking? Authorizing Provider  acetaminophen (TYLENOL) 325 MG tablet Take 650 mg by mouth every 6 (six) hours as needed.   Yes [provider]  albuterol (ACCUNEB) 1.25 MG/3ML nebulizer solution Take 1 ampule by nebulization every 6 (six) hours as needed for wheezing.  Yes [provider]  amLODipine (NORVASC) 10 MG tablet Take 10 mg by mouth See admin instructions. HOLD UNTIL AFTER DIALYSIS ON TUES/THURS/SAT AND HOLD FOR SYSTOLIC B/P READING OF 355 OR LESS   Yes [provider]  bisacodyl (BISAC-EVAC) 10 MG suppository Place 10 mg rectally once as needed (FOR CONSTIPATION NOT RELIEVED BY MILK OF MAGNESIA).    Yes [provider]    calcium acetate (PHOSLO) 667 MG capsule Take 1,334 mg by mouth 3 (three) times daily.    Yes [provider]  cloNIDine (CATAPRES) 0.1 MG tablet Take 0.1 mg by mouth every 8 (eight) hours as needed (for a systolic B/P of 732 or greater).    Yes [provider]  DEXTROMETHORPHAN HBR PO Take 20 mg by mouth every 8 (eight) hours as needed (for cough).    Yes [provider]  hydrocortisone cream 1 % Apply 1 application topically daily as needed (for hemorrhoids).    Yes [provider]  isosorbide mononitrate (IMDUR) 60 MG 24 hr tablet Take 60 mg by mouth daily.  03/24/15  Yes [provider]  LamoTRIgine 50 MG TBDP Take 50 mg by mouth daily.    Yes [provider]  loperamide (IMODIUM A-D) 2 MG tablet Take 2 mg by mouth See admin instructions. AFTER EACH LOOSE STOOL/NOT TO EXCEED 6 TABLETS IN 24 HOURS FOR 2 DAYS (MAY ALSO TAKE 4 MG AS NEEDED PRE-DIALYSIS)   Yes [provider]  LORazepam (ATIVAN) 0.5 MG tablet Take 1 tablet (0.5 mg total) by mouth at bedtime. 06/05/17  Yes Medina-Vargas, Monina C, NP  magnesium hydroxide (MILK OF MAGNESIA) 400 MG/5ML suspension Take 15-30 mLs by mouth once as needed for mild constipation.   Yes [provider]  multivitamin (RENA-VIT) TABS tablet Take 1 tablet by mouth daily.   Yes [provider]  ondansetron (ZOFRAN) 4 MG tablet Take 4 mg by mouth as needed. 04/26/17  Yes [provider]  promethazine (PHENERGAN) 25 MG tablet Take 25 mg by mouth every 6 (six) hours as needed for nausea or vomiting.   Yes [provider]  ranitidine (ZANTAC) 300 MG tablet Take 300 mg by mouth at bedtime. Reported on 03/05/2016   Yes [provider]  sennosides-docusate sodium (SENOKOT-S) 8.6-50 MG tablet Take 2 tablets by mouth at bedtime.   Yes [provider]  sertraline (ZOLOFT) 50 MG tablet Take 50 mg by mouth daily.    Yes [provider]  sevelamer carbonate  (RENVELA) 800 MG tablet Take 1,600 mg by mouth 3 (three) times daily with meals.    Yes [provider]  simethicone (MYLICON) 202 MG chewable tablet Take 1 tablet by mouth prior to each meal.   Yes [provider]  Sodium Phosphates (RA SALINE ENEMA) 19-7 GM/118ML ENEM Place 1 enema rectally once as needed (if constipation not relieved by Dulcolax suppository). CALL DOCTOR IF NO RELIEF FROM THIS   Yes [provider]  valACYclovir (VALTREX) 500 MG tablet Take 1 tablet (500 mg total) by mouth every other day. 03/01/16  Yes Hongalgi, Lenis Dickinson, MD  ondansetron (ZOFRAN ODT) 4 MG disintegrating tablet Take 1 tablet (4 mg total) by mouth every 8 (eight) hours as needed for nausea or vomiting. Patient not taking: Reported on 06/26/2017 01/21/15   Everlene Balls, MD    Family History Family History  Problem Relation Age of Onset  . Hypertension Mother   . Hypertension Father     Social History  Social History  Substance Use Topics  . Smoking status: Never Smoker  . Smokeless tobacco: Never Used  . Alcohol use No     Allergies   Patient has no known allergies.   Review of Systems Review of Systems  All other systems reviewed and are negative.    Physical Exam Updated Vital Signs BP (!) 165/101   Pulse (!) 53   Temp 98.6 F (37 C) (Oral)   Resp 20   Ht _0  (1.676 m)   Wt 67.1 kg (148 lb)   SpO2 100%   BMI 23.89 kg/m   Physical Exam  Constitutional: She is oriented to person, place, and time. She appears well-developed and well-nourished.  HENT:  Head: Normocephalic and atraumatic.  Eyes: Pupils are equal, round, and reactive to light. Conjunctivae and EOM are normal.  Neck: Normal range of motion. Neck supple.  Cardiovascular: Normal rate and regular rhythm.  Exam reveals no gallop and no friction rub.   No murmur heard. Pulmonary/Chest: Effort normal and breath sounds normal. No respiratory distress. She has no wheezes. She has no rales. She  exhibits no tenderness.  CTAB  Abdominal: Soft. Bowel sounds are normal. She exhibits no distension and no mass. There is no tenderness. There is no rebound and no guarding.  Musculoskeletal: Normal range of motion. She exhibits edema. She exhibits no tenderness.  1+ pitting edema  Neurological: She is alert and oriented to person, place, and time.  Skin: Skin is warm and dry.  Psychiatric: She has a normal mood and affect. Her behavior is normal. Judgment and thought content normal.  Nursing note and vitals reviewed.    ED Treatments / Results  Labs (all labs ordered are listed, but only abnormal results are displayed) Labs Reviewed  CBC WITH DIFFERENTIAL/PLATELET - Abnormal; Notable for the following:       Result Value   RBC 2.96 (*)    Hemoglobin 9.9 (*)    HCT 31.3 (*)    MCV 105.7 (*)    RDW 19.4 (*)    All other components within normal limits  BRAIN NATRIURETIC PEPTIDE - Abnormal; Notable for the following:    B Natriuretic Peptide 537.8 (*)    All other components within normal limits  COMPREHENSIVE METABOLIC PANEL    EKG  EKG Interpretation None       Radiology No results found.  Procedures Procedures (including critical care time)  Medications Ordered in ED Medications - No data to display   Initial Impression / Assessment and Plan / ED Course  I have reviewed the triage vital signs and the nursing notes.  Pertinent labs & imaging results that were available during my care of the patient were reviewed by me and considered in my medical decision making (see chart for details).     Patient with bilateral lower extremity swelling.  On HD, but missed yesterday.  K is 6.2, but no peaked t-waves. Will give some kayexalate tonight to help temporize until HD tomorrow morning. CXR is inconsistent with volume overload.  Denies any CP or SOB.    Moderately hypertensive, but no evidence of end organ damage.  Patient is overall well appearing.    Seen by and  discussed with Dr. Leonides Schanz, who agrees that the patient can be discharged with follow-up and HD tomorrow morning.  Patient understands and agrees with the plan. Final Clinical Impressions(s) / ED Diagnoses   Final diagnoses:  Leg swelling  Hyperkalemia    New Prescriptions New  Prescriptions   No medications on file     Montine Circle, Hershal Coria 06/27/17 0127    Ward, Delice Bison, DO 06/27/17 0130

## 2017-06-26 NOTE — ED Notes (Signed)
Pt to xray

## 2017-06-26 NOTE — ED Triage Notes (Signed)
Pt BIB [facility] from [place]. Pt is concerned about bilateral leg swelling with no pain after missing dialysis yesterday. Pt reports she refused dialysis because her BP was in the 200s. Pt alert and eating fried chicken upon arrival.

## 2017-06-26 NOTE — ED Notes (Signed)
Pt asking phlebotomy for food

## 2017-06-27 LAB — COMPREHENSIVE METABOLIC PANEL
ALBUMIN: 3.4 g/dL — AB (ref 3.5–5.0)
ALT: 8 U/L — ABNORMAL LOW (ref 14–54)
ANION GAP: 12 (ref 5–15)
AST: 16 U/L (ref 15–41)
Alkaline Phosphatase: 86 U/L (ref 38–126)
BILIRUBIN TOTAL: 0.5 mg/dL (ref 0.3–1.2)
BUN: 73 mg/dL — AB (ref 6–20)
CHLORIDE: 94 mmol/L — AB (ref 101–111)
CO2: 25 mmol/L (ref 22–32)
Calcium: 9.2 mg/dL (ref 8.9–10.3)
Creatinine, Ser: 10.29 mg/dL — ABNORMAL HIGH (ref 0.44–1.00)
GFR calc Af Amer: 4 mL/min — ABNORMAL LOW (ref >60)
GFR calc non Af Amer: 3 mL/min — ABNORMAL LOW (ref >60)
GLUCOSE: 84 mg/dL (ref 65–99)
POTASSIUM: 6.2 mmol/L — AB (ref 3.5–5.1)
Sodium: 131 mmol/L — ABNORMAL LOW (ref 135–145)
TOTAL PROTEIN: 7.9 g/dL (ref 6.5–8.1)

## 2017-06-27 MED ORDER — SODIUM POLYSTYRENE SULFONATE 15 GM/60ML PO SUSP
15.0000 g | Freq: Once | ORAL | Status: AC
  Administered 2017-06-27: 15 g via ORAL
  Filled 2017-06-27: qty 60

## 2017-06-27 NOTE — ED Notes (Signed)
Spoke with lab about BMP/CMP. States that they canceled the BMP and running the CMP because it has more tests. States that it is processing now.

## 2017-06-27 NOTE — Discharge Instructions (Signed)
YOU MUST REPORT FOR DIALYSIS TOMORROW, OR YOUR CONDITION COULD RAPIDLY WORSEN AND BECOME LIFE THREATENING.    Return to the ER for new or worsening symptoms.

## 2017-06-27 NOTE — ED Notes (Signed)
Pt given bag lunch and sprite.

## 2017-06-28 ENCOUNTER — Encounter: Payer: Self-pay | Admitting: Adult Health

## 2017-06-28 ENCOUNTER — Non-Acute Institutional Stay (SKILLED_NURSING_FACILITY): Payer: Medicare Other | Admitting: Adult Health

## 2017-06-28 DIAGNOSIS — F419 Anxiety disorder, unspecified: Secondary | ICD-10-CM | POA: Diagnosis not present

## 2017-06-28 DIAGNOSIS — I25119 Atherosclerotic heart disease of native coronary artery with unspecified angina pectoris: Secondary | ICD-10-CM

## 2017-06-28 DIAGNOSIS — F329 Major depressive disorder, single episode, unspecified: Secondary | ICD-10-CM

## 2017-06-28 DIAGNOSIS — I209 Angina pectoris, unspecified: Secondary | ICD-10-CM | POA: Diagnosis not present

## 2017-06-28 DIAGNOSIS — D638 Anemia in other chronic diseases classified elsewhere: Secondary | ICD-10-CM | POA: Diagnosis not present

## 2017-06-28 DIAGNOSIS — N186 End stage renal disease: Secondary | ICD-10-CM

## 2017-06-28 DIAGNOSIS — F32A Depression, unspecified: Secondary | ICD-10-CM

## 2017-06-28 DIAGNOSIS — Z992 Dependence on renal dialysis: Secondary | ICD-10-CM

## 2017-06-28 DIAGNOSIS — C9000 Multiple myeloma not having achieved remission: Secondary | ICD-10-CM | POA: Diagnosis not present

## 2017-06-28 DIAGNOSIS — E875 Hyperkalemia: Secondary | ICD-10-CM

## 2017-06-28 DIAGNOSIS — I120 Hypertensive chronic kidney disease with stage 5 chronic kidney disease or end stage renal disease: Secondary | ICD-10-CM | POA: Diagnosis not present

## 2017-06-28 NOTE — Progress Notes (Signed)
DATE:  06/28/2017   MRN:  [id number]  BIRTHDAY: [birth year]  Facility:  Nursing Home Location:  [facility] Room Number: 109-A  LEVEL OF CARE:  SNF (31)  Contact Information    Name Relation Home Work [place] Daughter [phone]  [phone]   Suazo,Lewis Brother   [phone]       Code Status History    Date Active Date Inactive Code Status Order ID Comments User Context   04/24/2017  8:00 PM 04/26/2017 11:01 PM Full Code [id number]  Modena Jansky, MD Inpatient   03/13/2016  3:32 AM 03/14/2016  8:20 PM Full Code [id number]  Toy Baker, MD ED   02/18/2016  2:29 AM 03/01/2016 10:37 PM Full Code [id number]  Toy Baker, MD ED   09/02/2015 12:51 AM 09/09/2015  1:52 AM Full Code [id number]  Allyne Gee, MD Inpatient   04/10/2015 10:22 PM 04/13/2015  7:07 PM Full Code [id number]  Gennaro Africa, MD Inpatient   10/16/2014  4:45 PM 10/19/2014  8:59 PM Full Code [id number]  Corky Sox, MD Inpatient   03/17/2014  1:24 PM 03/18/2014  4:59 PM Full Code [id number]  Markus Daft, MD Inpatient   03/11/2014  8:01 PM 03/17/2014  1:24 PM Full Code [id number]  Etta Quill, DO ED       Chief Complaint  Patient presents with  . Hospitalization Follow-up    Hospital followup    HISTORY OF PRESENT ILLNESS:  This is a 36-YO female seen for hospital follow-up, following a hospitalization at [facility] 06/26/2017-06/27/2017 for leg swelling due to refusal to go to dialysis.  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 She was complaining of lower extremity edema and requested to be hospitalized. In the ED, she had K 6.2 and was treated. She was seen in the room today. She was noted to   PAST MEDICAL HISTORY:  Past Medical History:  Diagnosis Date  . Anxiety   . Closed fracture of right distal femur ([place]) 09/01/2015  . Depression   . ESRD (end stage renal disease) on dialysis [facility])     "TTS; [facility]" (04/24/2017)  . GERD (gastroesophageal reflux disease) 04/23/2015  . HCAP (healthcare-associated pneumonia) 04/24/2017  . Heart murmur   . History of blood transfusion    "low HgB when I was going to dialysis center"  . Hypertension   . Hypertension associatd with end stage renal disease on dialysis 03/11/2014  . Malnutrition of moderate degree 09/02/2015  . Multiple myeloma ([place]) 03/11/2014  . Patient is Jehovah's Witness    "I'd rather never have any blood transfusions unless absolutely necessary; please check with me 1st". (04/24/2017)  . Pneumonia ~ 2016     CURRENT MEDICATIONS: Reviewed  Patient's Medications  New Prescriptions   No medications on file  Previous Medications   ACETAMINOPHEN (TYLENOL) 325 MG TABLET    Take 650 mg by mouth every 6 (six) hours as needed.   ALBUTEROL (ACCUNEB) 1.25 MG/3ML NEBULIZER SOLUTION    Take 1 ampule by nebulization every 6 (six) hours as needed for wheezing.   AMLODIPINE (NORVASC) 10 MG TABLET    Take 10 mg by mouth See admin instructions. HOLD UNTIL AFTER DIALYSIS ON TUES/THURS/SAT AND HOLD FOR SYSTOLIC B/P READING OF 989 OR LESS   BISACODYL (BISAC-EVAC) 10 MG SUPPOSITORY    Place 10 mg rectally once as needed (FOR CONSTIPATION NOT RELIEVED  BY MILK OF MAGNESIA).    CALCIUM ACETATE (PHOSLO) 667 MG CAPSULE    Take 1,334 mg by mouth 3 (three) times daily.    CLONIDINE (CATAPRES) 0.1 MG TABLET    Take 0.1 mg by mouth every 8 (eight) hours as needed (for a systolic B/P of 101 or greater).    DEXTROMETHORPHAN HBR PO    Take 20 mg by mouth every 8 (eight) hours as needed (for cough).    HYDROCORTISONE CREAM 1 %    Apply 1 application topically daily as needed (for hemorrhoids).    ISOSORBIDE MONONITRATE (IMDUR) 60 MG 24 HR TABLET    Take 60 mg by mouth daily.    LAMOTRIGINE 50 MG TBDP    Take 50 mg by mouth daily.    LOPERAMIDE (IMODIUM A-D) 2 MG TABLET    Take 2 mg by mouth See admin instructions. AFTER EACH LOOSE STOOL/NOT TO EXCEED  6 TABLETS IN 24 HOURS FOR 2 DAYS (MAY ALSO TAKE 4 MG AS NEEDED PRE-DIALYSIS)   LORAZEPAM (ATIVAN) 0.5 MG TABLET    Take 1 tablet (0.5 mg total) by mouth at bedtime.   MAGNESIUM HYDROXIDE (MILK OF MAGNESIA) 400 MG/5ML SUSPENSION    Take 15-30 mLs by mouth once as needed for mild constipation.   MULTIVITAMIN (RENA-VIT) TABS TABLET    Take 1 tablet by mouth daily.   ONDANSETRON (ZOFRAN ODT) 4 MG DISINTEGRATING TABLET    Take 1 tablet (4 mg total) by mouth every 8 (eight) hours as needed for nausea or vomiting.   ONDANSETRON (ZOFRAN) 4 MG TABLET    Take 4 mg by mouth as needed.   PROMETHAZINE (PHENERGAN) 25 MG TABLET    Take 25 mg by mouth every 6 (six) hours as needed for nausea or vomiting.   RANITIDINE (ZANTAC) 300 MG TABLET    Take 300 mg by mouth at bedtime. Reported on 03/05/2016   SENNOSIDES-DOCUSATE SODIUM (SENOKOT-S) 8.6-50 MG TABLET    Take 2 tablets by mouth at bedtime.   SERTRALINE (ZOLOFT) 50 MG TABLET    Take 50 mg by mouth daily.    SEVELAMER CARBONATE (RENVELA) 800 MG TABLET    Take 1,600 mg by mouth 3 (three) times daily with meals.    SIMETHICONE (MYLICON) 751 MG CHEWABLE TABLET    Take 1 tablet by mouth prior to each meal.   SODIUM PHOSPHATES (RA SALINE ENEMA) 19-7 GM/118ML ENEM    Place 1 enema rectally once as needed (if constipation not relieved by Dulcolax suppository). CALL DOCTOR IF NO RELIEF FROM THIS   VALACYCLOVIR (VALTREX) 500 MG TABLET    Take 1 tablet (500 mg total) by mouth every other day.  Modified Medications   No medications on file  Discontinued Medications   No medications on file     No Known Allergies   REVIEW OF SYSTEMS:  GENERAL: no change in appetite, no fatigue, no weight changes, no fever, chills or weakness MOUTH and THROAT: Denies oral discomfort, RESPIRATORY: no cough, SOB, DOE, wheezing, hemoptysis CARDIAC: no chest pain, or palpitations GI: no abdominal pain, diarrhea, constipation, heart burn, nausea or vomiting PSYCHIATRIC: Denies feeling  of depression or anxiety. No report of hallucinations, insomnia, paranoia, +agitation    PHYSICAL EXAMINATION  GENERAL APPEARANCE: Well nourished. In no acute distress. Normal body habitus SKIN:  Skin is warm and dry. RESPIRATORY: breathing is even & unlabored, BS CTAB CARDIAC: RRR, no murmur,no extra heart sounds, BLE trace edema GI: abdomen soft, normal BS, no masses, no  tenderness, no hepatomegaly, no splenomegaly EXTREMITIES:  Able to move X 4 extremities,  Left AV Fistula PSYCHIATRIC: Alert and oriented X 3. Affect and behavior are appropriate    LABS/RADIOLOGY: Labs reviewed: Basic Metabolic Panel:  Recent Labs  04/25/17 0730 04/26/17 0417 06/26/17 2324  NA 133* 136 131*  K 5.8* 4.3 6.2*  CL 98* 100* 94*  CO2 21* 28 25  GLUCOSE 72 73 84  BUN 72* 22* 73*  CREATININE 9.82* 4.63* 10.29*  CALCIUM 7.5* 8.2* 9.2   Liver Function Tests:  Recent Labs  04/23/17 04/24/17 1158 06/26/17 2324  AST 16 96* 16  ALT 8 26 8*  ALKPHOS 117 112 86  BILITOT  --  0.9 0.5  PROT  --  7.6 7.9  ALBUMIN  --  3.2* 3.4*   CBC:  Recent Labs  04/25/17 0730 04/26/17 0417 05/21/17 06/20/17 06/26/17 1824  WBC 8.6 10.2 3.6 10.1 4.1  NEUTROABS  --   --  2 9 2.5  HGB 9.2* 9.3* 8.3* 9.3* 9.9*  HCT 29.2* 28.8* 27* 30* 31.3*  MCV 101.4* 99.3  --   --  105.7*  PLT 72* 60* 161 179 165   Cardiac Enzymes:  Recent Labs  04/24/17 2057 04/25/17 0246 04/25/17 0730  TROPONINI 1.64* 0.97* 0.62*     Dg Chest 2 View  Result Date: 06/26/2017 CLINICAL DATA:  Leg swelling EXAM: CHEST  2 VIEW COMPARISON:  04/24/2017, 02/17/2017 FINDINGS: Cardiomegaly, similar compared to prior. Stable enlarged mediastinal silhouette with ectatic tortuous aorta. Linear scarring or atelectasis at the right base. No pleural effusion. Negative for a pneumothorax. IMPRESSION: 1. Subsegmental atelectasis or scar at the right base 2. Stable degree of cardiomegaly.  Negative for edema 3. Stable enlarged mediastinal  silhouette with tortuous ectatic aorta Electronically Signed   By: Donavan Foil M.D.   On: 06/26/2017 23:21    ASSESSMENT/PLAN:  1. Hyperkalemia - was treated in ED, check BMP next lab draw Lab Results  Component Value Date   K 6.2 (H) 06/26/2017    2. Chronic depression - continue Sertraline 50 mg 1 tab daily and Lamotrigine 25 mg  Give 2 tabs daily   3. Multiple myeloma, remission status unspecified (HCC) -  Currently on chemotherapy with Cyclophosphamide PO, Bortezomib SQ and Granix SQ, follows up with [facility]   4. Coronary artery disease involving native coronary artery of native heart with angina pectoris (HCC) - no complaints of chest pain, continue Isosorbide MN ER 60 mg 1 tab daily  5. Anxiety - agitated today, continue Ativan 0.5 mg 1 tab Q HS  6. Hypertension associatd with end stage renal disease on dialysis -  Continue Amlodipine 10 mg Q D and Clonidine 0.1 1 tab PO Q 8 hours PRN  7. ESRD (end stage renal disease) (HCC) -  Continue Sevelamer 800 mg give 2 tabs = 1600 mg PO TID, hemodialysis Q TThSat  8. Anemia of chronic disease - stable Lab Results  Component Value Date   HGB 9.9 (L) 06/26/2017      Goals of care:  Long-term care    Monina C. Audubon - NP   [employer] [phone]

## 2017-06-29 LAB — BASIC METABOLIC PANEL
BUN: 55 — AB (ref 4–21)
CREATININE: 9 — AB (ref 0.5–1.1)
GLUCOSE: 74
POTASSIUM: 5.4 — AB (ref 3.4–5.3)
SODIUM: 137 (ref 137–147)

## 2017-07-05 ENCOUNTER — Other Ambulatory Visit: Payer: Self-pay

## 2017-07-05 MED ORDER — LORAZEPAM 0.5 MG PO TABS: 0.5000 mg | ORAL_TABLET | Freq: Every day | ORAL | 0 refills | Status: DC

## 2017-07-05 NOTE — Telephone Encounter (Signed)
Faxed to [employer] [phone]

## 2017-07-16 ENCOUNTER — Non-Acute Institutional Stay (SKILLED_NURSING_FACILITY): Payer: Medicare Other | Admitting: Internal Medicine

## 2017-07-16 ENCOUNTER — Encounter: Payer: Self-pay | Admitting: Internal Medicine

## 2017-07-16 DIAGNOSIS — R200 Anesthesia of skin: Secondary | ICD-10-CM | POA: Diagnosis not present

## 2017-07-16 DIAGNOSIS — I10 Essential (primary) hypertension: Secondary | ICD-10-CM

## 2017-07-16 DIAGNOSIS — M542 Cervicalgia: Secondary | ICD-10-CM | POA: Diagnosis not present

## 2017-07-16 NOTE — Patient Instructions (Signed)
See assessment and plan under each diagnosis acutely for this visit  

## 2017-07-16 NOTE — Progress Notes (Signed)
    NURSING HOME LOCATION:  [facility] ROOM NUMBER:  109-A  CODE STATUS:  Full Code   PCP:  Hendricks Limes, MD  [place] [address]  This is a nursing facility follow up for specific acute issue of numbness left upper extremity  Interim medical record and care since last Martin visit was updated with review of diagnostic studies and change in clinical status since last visit were documented.  HPI: The patient ha just come back from dialysis complaining of numbness in the left upper extremity from the elbow down to the hand. It has resolved except for residual numbness in the hand at this time. This is in the context of her having the left extremity extended fully for 3.5 hours @ diallysis. The patient was concerned that she was having a stroke. Additionally she describes pain in the right posterior cervical area. This is a recurrent issue after dialysis as well.  Review of systems:   Constitutional: No fever,significant weight change, fatigue  Cardiovascular: No chest pain, palpitations,paroxysmal nocturnal dyspnea, claudication, edema  Respiratory: No cough, sputum production,hemoptysis, DOE , significant snoring,apnea   Musculoskeletal: No joint stiffness, joint swelling, weakness,pain other than neck Dermatologic: No rash, pruritus, change in appearance of skin Neurologic: No dizziness,headache,syncope, seizures Hematologic/lymphatic: No significant bruising, lymphadenopathy,abnormal bleeding  Physical exam:  Pertinent or positive findings: She is very anxious. She is edentulous, she wears only the upper plate. Grade 1.5 systolic murmur is present. Second heart sound is increased. She has minor diffuse rhonchi. There is nonpitting edema lower extremities. Pedal pulses are decreased. She is symmetrically weak to opposition in all extremities. She has a shunt in the left upper extremity. Cranial nerve exam revealed no deficit. Light touch was equal over  the face. She had good range of motion of the cervical spine despite the complaints of pain. General appearance:Adequately nourished; no acute distress , increased work of breathing is present.   Lymphatic: No lymphadenopathy about the head, neck, axilla . Eyes: No conjunctival inflammation or lid edema is present. There is no scleral icterus. Ears:  External ear exam shows no significant lesions or deformities. EOM & FOV intact.  Nose:  External nasal examination shows no deformity or inflammation. Nasal mucosa are pink and moist without lesions ,exudates Oral exam: lips and gums are healthy appearing.There is no oropharyngeal erythema or exudate . Neck:  No thyromegaly, masses, tenderness noted.    Heart:  Normal rate and regular rhythm. S1  normal without gallop, click, rub .  Lungs: without wheezes, rales , rubs. Abdomen:Bowel sounds are normal. Abdomen is soft and nontender with no organomegaly, hernias,masses. GU: deferred  Extremities:  No cyanosis, clubbing  Neurologic exam : C2-7 intact Balance,Rhomberg,finger to nose testing could not be completed due to clinical state Deep tendon reflexes are equal Skin: Warm & dry w/o tenting. No significant lesions or rash.   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 No localizing signs of TIA or stroke.  #2 hypertension #3 cervical pain most likely related prolonged positioning @ dialysis Plan: PRN Clonidine for blood pressure control. She was reassured that there is no clinical sign of a stroke at this time. She's been asked to discuss the cervical pain with dialysis, perhaps a cervical pillow would be an option.

## 2017-07-17 ENCOUNTER — Encounter: Payer: Self-pay | Admitting: Internal Medicine

## 2017-07-24 ENCOUNTER — Non-Acute Institutional Stay (SKILLED_NURSING_FACILITY): Payer: Medicare Other | Admitting: Adult Health

## 2017-07-24 ENCOUNTER — Encounter: Payer: Self-pay | Admitting: Adult Health

## 2017-07-24 DIAGNOSIS — I10 Essential (primary) hypertension: Secondary | ICD-10-CM

## 2017-07-24 DIAGNOSIS — F329 Major depressive disorder, single episode, unspecified: Secondary | ICD-10-CM

## 2017-07-24 DIAGNOSIS — I209 Angina pectoris, unspecified: Secondary | ICD-10-CM

## 2017-07-24 DIAGNOSIS — I25119 Atherosclerotic heart disease of native coronary artery with unspecified angina pectoris: Secondary | ICD-10-CM

## 2017-07-24 DIAGNOSIS — C9000 Multiple myeloma not having achieved remission: Secondary | ICD-10-CM | POA: Diagnosis not present

## 2017-07-24 DIAGNOSIS — N186 End stage renal disease: Secondary | ICD-10-CM | POA: Diagnosis not present

## 2017-07-24 DIAGNOSIS — F32A Depression, unspecified: Secondary | ICD-10-CM

## 2017-07-24 NOTE — Progress Notes (Signed)
DATE:  07/24/2017   MRN:  [id number]  BIRTHDAY: 19-Mar-1941  Facility:  Nursing Home Location:  [facility] Room Number: 109-A  LEVEL OF CARE:  SNF (31)  Contact Information    Name Relation Home Work [place] Daughter [phone]  [phone]   Crenshaw,Lewis Brother   [phone]       Code Status History    Date Active Date Inactive Code Status Order ID Comments User Context   04/24/2017  8:00 PM 04/26/2017 11:01 PM Full Code [id number]  Modena Jansky, MD Inpatient   03/13/2016  3:32 AM 03/14/2016  8:20 PM Full Code [id number]  Toy Baker, MD ED   02/18/2016  2:29 AM 03/01/2016 10:37 PM Full Code [id number]  Toy Baker, MD ED   09/02/2015 12:51 AM 09/09/2015  1:52 AM Full Code [id number]  Allyne Gee, MD Inpatient   04/10/2015 10:22 PM 04/13/2015  7:07 PM Full Code [id number]  Gennaro Africa, MD Inpatient   10/16/2014  4:45 PM 10/19/2014  8:59 PM Full Code [id number]  Corky Sox, MD Inpatient   03/17/2014  1:24 PM 03/18/2014  4:59 PM Full Code [id number]  Markus Daft, MD Inpatient   03/11/2014  8:01 PM 03/17/2014  1:24 PM Full Code [id number]  Etta Quill, DO ED       Chief Complaint  Patient presents with  . Medical Management of Chronic Issues    Routine SNF visit    HISTORY OF PRESENT ILLNESS:  This is a 76-YO female seen for a routine visit.  She is a long-term care resident of [facility].  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 She was seen in the room today. She has a doctor's appointment today. She has chemotherapy for multiple myeloma. When asked what is the month she answered correctly and said,"I am smart. I am born smart." She was in good mood today.    PAST MEDICAL HISTORY:  Past Medical History:  Diagnosis Date  . Anxiety   . Closed fracture of right distal femur ([place]) 09/01/2015  . Depression   . ESRD  (end stage renal disease) on dialysis [facility])    "TTS; [facility]" (04/24/2017)  . GERD (gastroesophageal reflux disease) 04/23/2015  . HCAP (healthcare-associated pneumonia) 04/24/2017  . Heart murmur   . History of blood transfusion    "low HgB when I was going to dialysis center"  . Hypertension   . Hypertension associatd with end stage renal disease on dialysis 03/11/2014  . Malnutrition of moderate degree 09/02/2015  . Multiple myeloma ([place]) 03/11/2014  . Patient is Jehovah's Witness    "I'd rather never have any blood transfusions unless absolutely necessary; please check with me 1st". (04/24/2017)  . Pneumonia ~ 2016     CURRENT MEDICATIONS: Reviewed  Patient's Medications  New Prescriptions   No medications on file  Previous Medications   ACETAMINOPHEN (TYLENOL) 325 MG TABLET    Take 650 mg by mouth every 6 (six) hours as needed.   ALBUTEROL (ACCUNEB) 1.25 MG/3ML NEBULIZER SOLUTION    Take 1 ampule by nebulization every 6 (six) hours as needed for wheezing.   AMLODIPINE (NORVASC) 10 MG TABLET    Take 10 mg by mouth See admin instructions. HOLD UNTIL AFTER DIALYSIS ON TUES/THURS/SAT AND HOLD FOR SYSTOLIC B/P READING OF 619 OR LESS   BISACODYL (BISAC-EVAC) 10 MG SUPPOSITORY  Place 10 mg rectally once as needed (FOR CONSTIPATION NOT RELIEVED BY MILK OF MAGNESIA).    CALCIUM ACETATE (PHOSLO) 667 MG CAPSULE    Take 1,334 mg by mouth 3 (three) times daily.    CLONIDINE (CATAPRES) 0.1 MG TABLET    Take 0.1 mg by mouth every 8 (eight) hours as needed (for a systolic B/P of 272 or greater).    HYDROCORTISONE CREAM 1 %    Apply 1 application topically daily as needed (for hemorrhoids).    ISOSORBIDE MONONITRATE (IMDUR) 60 MG 24 HR TABLET    Take 60 mg by mouth daily.    LAMOTRIGINE 50 MG TBDP    Take 50 mg by mouth daily.    LOPERAMIDE (IMODIUM A-D) 2 MG TABLET    Take 2 mg by mouth See admin instructions. AFTER EACH LOOSE STOOL/NOT TO EXCEED 6 TABLETS IN 24 HOURS FOR 2 DAYS (MAY ALSO TAKE 4  MG AS NEEDED PRE-DIALYSIS)   LORAZEPAM (ATIVAN) 0.5 MG TABLET    Take 1 tablet (0.5 mg total) by mouth at bedtime.   MAGNESIUM HYDROXIDE (MILK OF MAGNESIA) 400 MG/5ML SUSPENSION    Take 15-30 mLs by mouth once as needed for mild constipation.   MULTIVITAMIN (RENA-VIT) TABS TABLET    Take 1 tablet by mouth daily.   ONDANSETRON (ZOFRAN ODT) 4 MG DISINTEGRATING TABLET    Take 1 tablet (4 mg total) by mouth every 8 (eight) hours as needed for nausea or vomiting.   PROMETHAZINE (PHENERGAN) 25 MG TABLET    Take 25 mg by mouth every 6 (six) hours as needed for nausea or vomiting.   RANITIDINE (ZANTAC) 300 MG TABLET    Take 300 mg by mouth at bedtime. Reported on 03/05/2016   SENNOSIDES-DOCUSATE SODIUM (SENOKOT-S) 8.6-50 MG TABLET    Take 2 tablets by mouth at bedtime.   SERTRALINE (ZOLOFT) 50 MG TABLET    Take 50 mg by mouth daily.    SEVELAMER CARBONATE (RENVELA) 800 MG TABLET    Take 1,600 mg by mouth 3 (three) times daily with meals.    SIMETHICONE (MYLICON) 536 MG CHEWABLE TABLET    Take 1 tablet by mouth prior to each meal.   SODIUM PHOSPHATES (RA SALINE ENEMA) 19-7 GM/118ML ENEM    Place 1 enema rectally once as needed (if constipation not relieved by Dulcolax suppository). CALL DOCTOR IF NO RELIEF FROM THIS   VALACYCLOVIR (VALTREX) 500 MG TABLET    Take 1 tablet (500 mg total) by mouth every other day.  Modified Medications   No medications on file  Discontinued Medications   DEXTROMETHORPHAN HBR PO    Take 20 mg by mouth every 8 (eight) hours as needed (for cough).    ONDANSETRON (ZOFRAN) 4 MG TABLET    Take 4 mg by mouth as needed.     No Known Allergies   REVIEW OF SYSTEMS:  GENERAL: no change in appetite, no fatigue, no weight changes, no fever, chills or weakness MOUTH and THROAT: Denies oral discomfort  RESPIRATORY: no cough, SOB, DOE, wheezing, hemoptysis CARDIAC: no chest pain, edema or palpitations GI: no abdominal pain, diarrhea, constipation, heart burn, nausea or vomiting GU:  Denies dysuria, frequency, hematuria, incontinence, or discharge PSYCHIATRIC: Denies feeling of depression or anxiety. No report of hallucinations, insomnia, paranoia, or agitation     PHYSICAL EXAMINATION  GENERAL APPEARANCE: Well nourished. In no acute distress. Normal body habitus SKIN:  Skin is warm and dry.  MOUTH and THROAT: Lips are without lesions. Oral  mucosa is moist and without lesions. Tongue is normal in shape, size, and color and without lesions RESPIRATORY: breathing is even & unlabored, BS CTAB CARDIAC: RRR, + murmur,no extra heart sounds, no edema GI: abdomen soft, normal BS, no masses, no tenderness, no hepatomegaly, no splenomegaly EXTREMITIES:  Able to move X 4 extremities, has left upper arm AV fistula + bruit/thrill PSYCHIATRIC: Alert and oriented X 3. Affect and behavior are appropriate   LABS/RADIOLOGY: Labs reviewed: Basic Metabolic Panel:  Recent Labs  04/25/17 0730 04/26/17 0417 06/26/17 2324 06/29/17  NA 133* 136 131* 137  K 5.8* 4.3 6.2* 5.4*  CL 98* 100* 94*  --   CO2 21* 28 25  --   GLUCOSE 72 73 84  --   BUN 72* 22* 73* 55*  CREATININE 9.82* 4.63* 10.29* 9.0*  CALCIUM 7.5* 8.2* 9.2  --    Liver Function Tests:  Recent Labs  04/23/17 04/24/17 1158 06/26/17 2324  AST 16 96* 16  ALT 8 26 8*  ALKPHOS 117 112 86  BILITOT  --  0.9 0.5  PROT  --  7.6 7.9  ALBUMIN  --  3.2* 3.4*   CBC:  Recent Labs  04/25/17 0730 04/26/17 0417 05/21/17 06/20/17 06/26/17 1824  WBC 8.6 10.2 3.6 10.1 4.1  NEUTROABS  --   --  2 9 2.5  HGB 9.2* 9.3* 8.3* 9.3* 9.9*  HCT 29.2* 28.8* 27* 30* 31.3*  MCV 101.4* 99.3  --   --  105.7*  PLT 72* 60* 161 179 165   Cardiac Enzymes:  Recent Labs  04/24/17 2057 04/25/17 0246 04/25/17 0730  TROPONINI 1.64* 0.97* 0.62*     Dg Chest 2 View  Result Date: 06/26/2017 CLINICAL DATA:  Leg swelling EXAM: CHEST  2 VIEW COMPARISON:  04/24/2017, 02/17/2017 FINDINGS: Cardiomegaly, similar compared to prior. Stable  enlarged mediastinal silhouette with ectatic tortuous aorta. Linear scarring or atelectasis at the right base. No pleural effusion. Negative for a pneumothorax. IMPRESSION: 1. Subsegmental atelectasis or scar at the right base 2. Stable degree of cardiomegaly.  Negative for edema 3. Stable enlarged mediastinal silhouette with tortuous ectatic aorta Electronically Signed   By: Donavan Foil M.D.   On: 06/26/2017 23:21    ASSESSMENT/PLAN:  1. ESRD (end stage renal disease) ([place]) - continue hemodialysis every Tuesdays, Thursdays and Saturdays   2. Multiple myeloma, remission status unspecified (HCC) - currently on chemotherapy and follows up with [facility] hematology, continue valacyclovir 500 mg every other day    3. Essential hypertension - stable, continue amlodipine 10 mg 1 tab daily and clonidine 0.1 mg 1 tab every 8 hours when necessary for SBP > 180   4. Coronary artery disease involving native coronary artery of native heart with angina pectoris (HCC) - no complaints of pain, continue isosorbide MN ER 60 mg 1 tab daily   5. Chronic depression - stable, continue sertraline 50 mg 1 tab daily and lamotrigine 25 mg give 2 tabs = 50 mg daily     Goals of care:  Long-term care    Monina C. Saltillo - NP    [employer] [phone]

## 2017-08-02 ENCOUNTER — Other Ambulatory Visit: Payer: Self-pay

## 2017-08-02 MED ORDER — LORAZEPAM 0.5 MG PO TABS: 0.5000 mg | ORAL_TABLET | Freq: Every day | ORAL | 0 refills | Status: DC

## 2017-08-02 NOTE — Telephone Encounter (Signed)
Prescription form faxed to [facility] [phone]  Informed nurse that it was faxed and put form with line drawn through it in the pharmacy box.

## 2017-08-29 ENCOUNTER — Encounter: Payer: Self-pay | Admitting: Adult Health

## 2017-08-29 ENCOUNTER — Non-Acute Institutional Stay (SKILLED_NURSING_FACILITY): Payer: Medicare Other | Admitting: Adult Health

## 2017-08-29 DIAGNOSIS — C9000 Multiple myeloma not having achieved remission: Secondary | ICD-10-CM | POA: Diagnosis not present

## 2017-08-29 DIAGNOSIS — N186 End stage renal disease: Secondary | ICD-10-CM

## 2017-08-29 DIAGNOSIS — I25119 Atherosclerotic heart disease of native coronary artery with unspecified angina pectoris: Secondary | ICD-10-CM | POA: Diagnosis not present

## 2017-08-29 DIAGNOSIS — I120 Hypertensive chronic kidney disease with stage 5 chronic kidney disease or end stage renal disease: Secondary | ICD-10-CM

## 2017-08-29 DIAGNOSIS — Z992 Dependence on renal dialysis: Secondary | ICD-10-CM

## 2017-08-29 DIAGNOSIS — K5901 Slow transit constipation: Secondary | ICD-10-CM

## 2017-08-29 DIAGNOSIS — F329 Major depressive disorder, single episode, unspecified: Secondary | ICD-10-CM

## 2017-08-29 DIAGNOSIS — F419 Anxiety disorder, unspecified: Secondary | ICD-10-CM

## 2017-08-29 DIAGNOSIS — D638 Anemia in other chronic diseases classified elsewhere: Secondary | ICD-10-CM | POA: Diagnosis not present

## 2017-08-29 DIAGNOSIS — I209 Angina pectoris, unspecified: Secondary | ICD-10-CM | POA: Diagnosis not present

## 2017-08-29 DIAGNOSIS — F32A Depression, unspecified: Secondary | ICD-10-CM

## 2017-08-29 NOTE — Progress Notes (Signed)
Location:  [place] Room Number: 109-A Place of Service:  SNF [phone]) Provider:  Durenda Age, NP  Patient Care Team: Hendricks Limes, MD as PCP - General (Internal Medicine)  Extended Emergency Contact Information Primary Emergency Contact: Wilburn Mylar [place] of [place] Phone: [phone] Mobile Phone: [phone] Relation: Daughter Secondary Emergency Contact: Odie Sera [place] of [place] Mobile Phone: [phone] Relation: Brother  Code Status:  Full Code  Goals of care: Advanced Directive information Advanced Directives 06/26/2017  Does Patient Have a Medical Advance Directive? No  Type of Advance Directive -  Does patient want to make changes to medical advance directive? -  Copy of [place] in Chart? -  Would patient like information on creating a medical advance directive? -     Chief Complaint  Patient presents with  . Medical Management of Chronic Issues    Routine [facility] visit    HPI:  Pt is a 76 y.o. female seen today for medical management of chronic diseases.  She is a long-term care resident of [facility].  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 She was seen in the room today. She was seen writing something on a notebook. Today is her dialysis day. She said that she is too busy today but agreed to be seen for a routine visit. She verbalized no concerns.    Past Medical History:  Diagnosis Date  . Anxiety   . Closed fracture of right distal femur ([place]) 09/01/2015  . Depression   . ESRD (end stage renal disease) on dialysis [facility])    "TTS; [facility]" (04/24/2017)  . GERD (gastroesophageal reflux disease) 04/23/2015  . HCAP (healthcare-associated pneumonia) 04/24/2017  . Heart murmur   . History of blood transfusion    "low HgB when I was going to dialysis center"  .  Hypertension   . Hypertension associatd with end stage renal disease on dialysis 03/11/2014  . Malnutrition of moderate degree 09/02/2015  . Multiple myeloma ([place]) 03/11/2014  . Patient is Jehovah's Witness    "I'd rather never have any blood transfusions unless absolutely necessary; please check with me 1st". (04/24/2017)  . Pneumonia ~ 2016   Past Surgical History:  Procedure Laterality Date  . AV FISTULA PLACEMENT Left   . I&D EXTREMITY Left 02/27/2016   Procedure: ARTHROSCOPIC IRRIGATION AND DEBRIDEMENT EXTREMITY;  Surgeon: Renette Butters, MD;  Location: [place];  Service: Orthopedics;  Laterality: Left;  . TEE WITHOUT CARDIOVERSION N/A 02/29/2016   Procedure: TRANSESOPHAGEAL ECHOCARDIOGRAM (TEE);  Surgeon: Thayer Headings, MD;  Location: [facility] ENDOSCOPY;  Service: Cardiovascular;  Laterality: N/A;    No Known Allergies  Outpatient Encounter Medications as of 08/29/2017  Medication Sig  . acetaminophen (TYLENOL) 325 MG tablet Take 650 mg by mouth every 6 (six) hours as needed.  Marland Kitchen albuterol (PROVENTIL) (2.5 MG/3ML) 0.083% nebulizer solution Take 2.5 mg by nebulization every 6 (six) hours as needed for wheezing or shortness of breath.  Marland Kitchen amLODipine (NORVASC) 10 MG tablet Take 10 mg by mouth See admin instructions. HOLD UNTIL AFTER DIALYSIS ON TUES/THURS/SAT AND HOLD FOR SYSTOLIC B/P READING OF 106 OR LESS  . bisacodyl (BISAC-EVAC) 10 MG suppository Place 10 mg rectally once as needed (FOR CONSTIPATION NOT RELIEVED BY MILK OF MAGNESIA).   . calcium acetate (PHOSLO) 667 MG capsule Take 1,334 mg by mouth 3 (three) times daily.   Marland Kitchen  cloNIDine (CATAPRES) 0.1 MG tablet Take 0.1 mg by mouth every 8 (eight) hours as needed (for a systolic B/P of 245 or greater).   . hydrocortisone cream 1 % Apply 1 application topically daily as needed (for hemorrhoids).   . isosorbide mononitrate (IMDUR) 60 MG 24 hr tablet Take 60 mg by mouth daily.   . LamoTRIgine 50 MG TBDP Take 50 mg by mouth daily.   Marland Kitchen loperamide  (IMODIUM A-D) 2 MG tablet Take 2 mg by mouth See admin instructions. AFTER EACH LOOSE STOOL/NOT TO EXCEED 6 TABLETS IN 24 HOURS FOR 2 DAYS (MAY ALSO TAKE 4 MG AS NEEDED PRE-DIALYSIS)  . LORazepam (ATIVAN) 0.5 MG tablet Take 1 tablet (0.5 mg total) by mouth at bedtime.  . magnesium hydroxide (MILK OF MAGNESIA) 400 MG/5ML suspension Take 15-30 mLs by mouth once as needed for mild constipation.  . multivitamin (RENA-VIT) TABS tablet Take 1 tablet by mouth daily.  . ondansetron (ZOFRAN ODT) 4 MG disintegrating tablet Take 1 tablet (4 mg total) by mouth every 8 (eight) hours as needed for nausea or vomiting.  . promethazine (PHENERGAN) 25 MG tablet Take 25 mg by mouth every 6 (six) hours as needed for nausea or vomiting.  . ranitidine (ZANTAC) 300 MG tablet Take 300 mg by mouth at bedtime. Reported on 03/05/2016  . sennosides-docusate sodium (SENOKOT-S) 8.6-50 MG tablet Take 2 tablets by mouth at bedtime.  . sertraline (ZOLOFT) 50 MG tablet Take 50 mg by mouth daily.   . sevelamer carbonate (RENVELA) 800 MG tablet Take 1,600 mg by mouth 3 (three) times daily with meals.   . simethicone (MYLICON) 809 MG chewable tablet Take 1 tablet by mouth prior to each meal.  . Sodium Phosphates (RA SALINE ENEMA) 19-7 GM/118ML ENEM Place 1 enema rectally once as needed (if constipation not relieved by Dulcolax suppository). CALL DOCTOR IF NO RELIEF FROM THIS  . valACYclovir (VALTREX) 500 MG tablet Take 1 tablet (500 mg total) by mouth every other day.  . [DISCONTINUED] albuterol (ACCUNEB) 1.25 MG/3ML nebulizer solution Take 1 ampule by nebulization every 6 (six) hours as needed for wheezing.   No facility-administered encounter medications on file as of 08/29/2017.     Review of Systems  GENERAL: No change in appetite, no fatigue, no weight changes, no fever, chills or weakness MOUTH and THROAT: Denies oral discomfort, gingival pain or bleeding RESPIRATORY: no cough, SOB, DOE, wheezing, hemoptysis CARDIAC: No chest  pain, edema or palpitations GI: No abdominal pain, diarrhea, constipation, heart burn, nausea or vomiting GU: Denies dysuria, frequency, hematuria, incontinence, or discharge PSYCHIATRIC: Denies feelings of depression or anxiety. No report of hallucinations, insomnia, paranoia, or agitation    Immunization History  Administered Date(s) Administered  . Influenza,inj,Quad PF,6+ Mos 07/23/2014, 08/01/2016  . PPD Test 04/13/2015   Pertinent  Health Maintenance Due  Topic Date Due  . INFLUENZA VACCINE  07/31/2018 (Originally 05/01/2017)  . PNA vac Low Risk Adult (1 of 2 - PCV13) 08/29/2018 (Originally 03/20/2004)  . DEXA SCAN  11/28/2018 (Originally 03/20/2004)      Vitals:   08/29/17 0916  BP: 133/71  Pulse: 64  Resp: 16  Temp: 97.7 F (36.5 C)  TempSrc: Oral  SpO2: 97%  Weight: 135 lb 4.8 oz (61.4 kg)  Height: '5\' 6"'$  (1.676 m)   Body mass index is 21.84 kg/m.   Physical Exam  GENERAL APPEARANCE: Well nourished. In no acute distress. Normal body habitus SKIN:  Skin is warm and dry.  MOUTH and THROAT: Lips  are without lesions. Oral mucosa is moist and without lesions.  RESPIRATORY: Breathing is even & unlabored, BS CTAB CARDIAC: RRR, no murmur,no extra heart sounds, no edema GI: Abdomen soft, normal BS, no masses, no tenderness EXTREMITIES: Able to move X 4 extremities, Left upper arm AV fistula + bruit/thrill PSYCHIATRIC: Alert and oriented X 3. Affect and behavior are appropriate    Labs reviewed: Recent Labs    04/25/17 0730 04/26/17 0417 06/26/17 2324 06/29/17  NA 133* 136 131* 137  K 5.8* 4.3 6.2* 5.4*  CL 98* 100* 94*  --   CO2 21* 28 25  --   GLUCOSE 72 73 84  --   BUN 72* 22* 73* 55*  CREATININE 9.82* 4.63* 10.29* 9.0*  CALCIUM 7.5* 8.2* 9.2  --    Recent Labs    04/23/17 04/24/17 1158 06/26/17 2324  AST 16 96* 16  ALT 8 26 8*  ALKPHOS 117 112 86  BILITOT  --  0.9 0.5  PROT  --  7.6 7.9  ALBUMIN  --  3.2* 3.4*   Recent Labs    04/25/17 0730  04/26/17 0417 05/21/17 06/20/17 06/26/17 1824  WBC 8.6 10.2 3.6 10.1 4.1  NEUTROABS  --   --  2 9 2.5  HGB 9.2* 9.3* 8.3* 9.3* 9.9*  HCT 29.2* 28.8* 27* 30* 31.3*  MCV 101.4* 99.3  --   --  105.7*  PLT 72* 60* 161 179 165   Lab Results  Component Value Date   TSH 1.457 03/13/2016   Lab Results  Component Value Date   HGBA1C 4.9 03/13/2016     Assessment/Plan:  1. Hypertension associatd with end stage renal disease on dialysis - BP reviews are inconsistent, 250/120, 166/92, 109/93, 103/64. This BPs were not communicated to provider but was noted on review; will order to check BP BID X 2 weeks, continue  Amlodipine 10 mg daily and Clonidine 0.1 mg 1 tab Q 8 hours PRN   2. Anemia of chronic disease -  Lab Results  Component Value Date   HGB 9.9 (L) 06/26/2017     3. Anxiety - mood is stable, continue Ativan 0.5 mg 1 tab Q HS   4. Chronic depression - continue Lamotrigine 20 mg daily and Sertraline 50 mg 1 tab daily   5. Multiple myeloma, remission status unspecified ([place]) - currently  having chemotherapy infusion @ [facility], Dexamethasone  20 mg, Cyclophosphamide 300 mg PO and Bertezomib 2.2 mg injection, Valacyclovir 500 mg Q other day for prophylaxis   6. ESRD (end stage renal disease) ([place]) - continue hemodialysis 3X/week, TThSat, continue Sevelamer carbonate 800 mg 2 tabs TID   7. Coronary artery disease involving native coronary artery of native heart with angina pectoris (HCC) - no complaints of chest pains, continue Isosorbide MN ER 60 mg daily   8. Slow transit constipation - reported moving her bowels this morning, continue Senexon S 2 tabs Q HS      Family/ staff Communication: Communicated/discussed plan of care with patient and charge nurse.  Labs/tests ordered:  None    Kalsey Lull C. [place] [employer] [phone]

## 2017-09-12 ENCOUNTER — Other Ambulatory Visit: Payer: Self-pay

## 2017-09-12 MED ORDER — LORAZEPAM 0.5 MG PO TABS: 0.5000 mg | ORAL_TABLET | Freq: Every day | ORAL | 0 refills | Status: DC

## 2017-09-12 NOTE — Telephone Encounter (Signed)
Order faxed to [facility] [phone]  After faxing order I drew a diagonal line through it and put it in pharmacy tote on [place] hall.  Nurse notified.

## 2017-09-27 ENCOUNTER — Non-Acute Institutional Stay (SKILLED_NURSING_FACILITY): Payer: Medicare Other | Admitting: Adult Health

## 2017-09-27 ENCOUNTER — Encounter: Payer: Self-pay | Admitting: Adult Health

## 2017-09-27 DIAGNOSIS — K219 Gastro-esophageal reflux disease without esophagitis: Secondary | ICD-10-CM

## 2017-09-27 DIAGNOSIS — I120 Hypertensive chronic kidney disease with stage 5 chronic kidney disease or end stage renal disease: Secondary | ICD-10-CM | POA: Diagnosis not present

## 2017-09-27 DIAGNOSIS — F329 Major depressive disorder, single episode, unspecified: Secondary | ICD-10-CM | POA: Diagnosis not present

## 2017-09-27 DIAGNOSIS — D638 Anemia in other chronic diseases classified elsewhere: Secondary | ICD-10-CM | POA: Diagnosis not present

## 2017-09-27 DIAGNOSIS — F419 Anxiety disorder, unspecified: Secondary | ICD-10-CM

## 2017-09-27 DIAGNOSIS — N186 End stage renal disease: Secondary | ICD-10-CM | POA: Diagnosis not present

## 2017-09-27 DIAGNOSIS — I25119 Atherosclerotic heart disease of native coronary artery with unspecified angina pectoris: Secondary | ICD-10-CM | POA: Diagnosis not present

## 2017-09-27 DIAGNOSIS — Z992 Dependence on renal dialysis: Secondary | ICD-10-CM

## 2017-09-27 DIAGNOSIS — C9000 Multiple myeloma not having achieved remission: Secondary | ICD-10-CM | POA: Diagnosis not present

## 2017-09-27 DIAGNOSIS — I209 Angina pectoris, unspecified: Secondary | ICD-10-CM | POA: Diagnosis not present

## 2017-09-27 DIAGNOSIS — F32A Depression, unspecified: Secondary | ICD-10-CM

## 2017-09-27 NOTE — Progress Notes (Signed)
Location:  [place] Room Number: 106-B Place of Service:  SNF (31) Provider:  Durenda Age, NP  Patient Care Team: Hendricks Limes, MD as PCP - General (Internal Medicine)  Extended Emergency Contact Information Primary Emergency Contact: Wilburn Mylar [place] of [place] Phone: [phone] Mobile Phone: [phone] Relation: Daughter Secondary Emergency Contact: Odie Sera [place] of [place] Mobile Phone: [phone] Relation: Brother  Code Status:  Full Code  Goals of care: Advanced Directive information Advanced Directives 06/26/2017  Does Patient Have a Medical Advance Directive? No  Type of Advance Directive -  Does patient want to make changes to medical advance directive? -  Copy of [place] in Chart? -  Would patient like information on creating a medical advance directive? -     Chief Complaint  Patient presents with  . Medical Management of Chronic Issues    Routine [facility] visit    HPI:  Pt is a 76 y.o. female seen today for medical management of chronic diseases.  She is a long-term care resident of [facility].  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 She refused to go to dialysis yesterday secondary to diarrhea. She was seen in the room today. She was in a hurry getting ready for a doctor's appointment today.     Past Medical History:  Diagnosis Date  . Anxiety   . Closed fracture of right distal femur ([place]) 09/01/2015  . Depression   . ESRD (end stage renal disease) on dialysis [facility])    "TTS; [facility]" (04/24/2017)  . GERD (gastroesophageal reflux disease) 04/23/2015  . HCAP (healthcare-associated pneumonia) 04/24/2017  . Heart murmur   . History of blood transfusion    "low HgB when I was going to dialysis center"  . Hypertension   . Hypertension associatd with end stage  renal disease on dialysis 03/11/2014  . Malnutrition of moderate degree 09/02/2015  . Multiple myeloma ([place]) 03/11/2014  . Patient is Jehovah's Witness    "I'd rather never have any blood transfusions unless absolutely necessary; please check with me 1st". (04/24/2017)  . Pneumonia ~ 2016   Past Surgical History:  Procedure Laterality Date  . AV FISTULA PLACEMENT Left   . I&D EXTREMITY Left 02/27/2016   Procedure: ARTHROSCOPIC IRRIGATION AND DEBRIDEMENT EXTREMITY;  Surgeon: Renette Butters, MD;  Location: [place];  Service: Orthopedics;  Laterality: Left;  . TEE WITHOUT CARDIOVERSION N/A 02/29/2016   Procedure: TRANSESOPHAGEAL ECHOCARDIOGRAM (TEE);  Surgeon: Thayer Headings, MD;  Location: [facility] ENDOSCOPY;  Service: Cardiovascular;  Laterality: N/A;    No Known Allergies  Outpatient Encounter Medications as of 09/27/2017  Medication Sig  . acetaminophen (TYLENOL) 325 MG tablet Take 650 mg by mouth every 6 (six) hours as needed for fever.   Marland Kitchen albuterol (PROVENTIL) (2.5 MG/3ML) 0.083% nebulizer solution Take 2.5 mg by nebulization every 6 (six) hours as needed for wheezing or shortness of breath.  Marland Kitchen amLODipine (NORVASC) 10 MG tablet Take 10 mg by mouth See admin instructions. HOLD UNTIL AFTER DIALYSIS ON TUES/THURS/SAT AND HOLD FOR SYSTOLIC B/P READING OF 517 OR LESS  . bisacodyl (BISAC-EVAC) 10 MG suppository Place 10 mg rectally once as needed (FOR CONSTIPATION NOT RELIEVED BY MILK OF MAGNESIA).   . calcium acetate (PHOSLO) 667 MG capsule Take 1,334 mg by mouth 3 (three) times daily.   . cloNIDine (CATAPRES) 0.1 MG tablet Take 0.1 mg  by mouth every 8 (eight) hours as needed (for a systolic B/P of 573 or greater).   . hydrocortisone cream 1 % Apply 1 application topically daily as needed (for hemorrhoids).   . isosorbide mononitrate (IMDUR) 60 MG 24 hr tablet Take 60 mg by mouth daily.   . LamoTRIgine 50 MG TBDP Take 50 mg by mouth daily.   Marland Kitchen loperamide (IMODIUM A-D) 2 MG tablet Take 2 mg by mouth  See admin instructions. AFTER EACH LOOSE STOOL/NOT TO EXCEED 6 TABLETS IN 24 HOURS FOR 2 DAYS (MAY ALSO TAKE 4 MG AS NEEDED PRE-DIALYSIS)  . LORazepam (ATIVAN) 0.5 MG tablet Take 1 tablet (0.5 mg total) by mouth at bedtime.  . magnesium hydroxide (MILK OF MAGNESIA) 400 MG/5ML suspension Take 15-30 mLs by mouth once as needed for mild constipation.  . multivitamin (RENA-VIT) TABS tablet Take 1 tablet by mouth daily.  . ondansetron (ZOFRAN ODT) 4 MG disintegrating tablet Take 1 tablet (4 mg total) by mouth every 8 (eight) hours as needed for nausea or vomiting.  . promethazine (PHENERGAN) 25 MG tablet Take 25 mg by mouth every 6 (six) hours as needed for nausea or vomiting.  . ranitidine (ZANTAC) 300 MG tablet Take 300 mg by mouth at bedtime. Reported on 03/05/2016  . sennosides-docusate sodium (SENOKOT-S) 8.6-50 MG tablet Take 2 tablets by mouth at bedtime.  . sertraline (ZOLOFT) 50 MG tablet Take 50 mg by mouth daily.   . sevelamer carbonate (RENVELA) 800 MG tablet Take 1,600 mg by mouth 3 (three) times daily with meals.   . simethicone (MYLICON) 220 MG chewable tablet Take 1 tablet by mouth prior to each meal.  . Sodium Phosphates (RA SALINE ENEMA) 19-7 GM/118ML ENEM Place 1 enema rectally once as needed (if constipation not relieved by Dulcolax suppository). CALL DOCTOR IF NO RELIEF FROM THIS  . valACYclovir (VALTREX) 500 MG tablet Take 1 tablet (500 mg total) by mouth every other day.   No facility-administered encounter medications on file as of 09/27/2017.     Review of Systems  GENERAL: No change in appetite, no fatigue, no weight changes, no fever, chills or weakness MOUTH and THROAT: Denies oral discomfort, gingival pain or bleeding RESPIRATORY: no cough, SOB, DOE, wheezing, hemoptysis CARDIAC: No chest pain, edema or palpitations GI: No abdominal pain, diarrhea, constipation, heart burn, nausea or vomiting PSYCHIATRIC: Denies feelings of depression or anxiety. No report of  hallucinations, insomnia, paranoia, or agitation   Immunization History  Administered Date(s) Administered  . Influenza,inj,Quad PF,6+ Mos 07/23/2014, 08/01/2016  . PPD Test 04/13/2015   Pertinent  Health Maintenance Due  Topic Date Due  . INFLUENZA VACCINE  07/31/2018 (Originally 05/01/2017)  . PNA vac Low Risk Adult (1 of 2 - PCV13) 08/29/2018 (Originally 03/20/2004)  . DEXA SCAN  11/28/2018 (Originally 03/20/2004)   Fall Risk  06/06/2017 11/09/2016 06/15/2016 05/25/2016 05/23/2016  Falls in the past year? No No No No No  Number falls in past yr: - - - - -  Injury with Fall? - - - - -  Risk Factor Category  - - - - -  Risk for fall due to : - - - - -  Risk for fall due to: Comment - - - - -  Follow up - - - - -      Vitals:   09/27/17 1206  BP: 113/69  Pulse: 64  Resp: 20  Temp: 99.2 F (37.3 C)  TempSrc: Oral  SpO2: 98%  Weight: 135 lb 4.8 oz (  61.4 kg)  Height: '5\' 6"'  (1.676 m)   Body mass index is 21.84 kg/m.  Physical Exam  GENERAL APPEARANCE: Well nourished. In no acute distress. Normal body habitus SKIN:  Skin is warm and dry.  MOUTH and THROAT: Lips are without lesions. Oral mucosa is moist and without lesions.  RESPIRATORY: Breathing is even & unlabored, BS CTAB CARDIAC: RRR, no murmur,no extra heart sounds, trace edema BLE, LUE AV Fistula + bruit/thrill GI: Abdomen soft, normal BS, no masses, no tenderness EXTREMITIES:  Able to move X 4 extremities PSYCHIATRIC: Alert and oriented X 3. Affect and behavior are appropriate   Labs reviewed: Recent Labs    04/25/17 0730 04/26/17 0417 06/26/17 2324 06/29/17  NA 133* 136 131* 137  K 5.8* 4.3 6.2* 5.4*  CL 98* 100* 94*  --   CO2 21* 28 25  --   GLUCOSE 72 73 84  --   BUN 72* 22* 73* 55*  CREATININE 9.82* 4.63* 10.29* 9.0*  CALCIUM 7.5* 8.2* 9.2  --    Recent Labs    04/23/17 04/24/17 1158 06/26/17 2324  AST 16 96* 16  ALT 8 26 8*  ALKPHOS 117 112 86  BILITOT  --  0.9 0.5  PROT  --  7.6 7.9  ALBUMIN   --  3.2* 3.4*   Recent Labs    04/25/17 0730 04/26/17 0417 05/21/17 06/20/17 06/26/17 1824  WBC 8.6 10.2 3.6 10.1 4.1  NEUTROABS  --   --  2 9 2.5  HGB 9.2* 9.3* 8.3* 9.3* 9.9*  HCT 29.2* 28.8* 27* 30* 31.3*  MCV 101.4* 99.3  --   --  105.7*  PLT 72* 60* 161 179 165   Lab Results  Component Value Date   TSH 1.457 03/13/2016   Lab Results  Component Value Date   HGBA1C 4.9 03/13/2016    Assessment/Plan  1. Multiple myeloma, remission status unspecified (HCC) - currently on chemotherapy, cyclophosphamide PO, Velcade SQ, Granix SQ and Dexamethasone PO and Zofran PO, Valacyclovir 500 mg 1 tab Q other day   2. ESRD (end stage renal disease) ([place]) - continue hemodialysis Q Tuesdays, Thursdays and Saturdays and Sevelamer 800 mg give 2 tabs = 1600 TID   3. Anxiety - mood is stable, continue Lorazepam 0.5 mg 1 tab Q HS   4. Coronary artery disease involving native coronary artery of native heart with angina pectoris (HCC) - no chest pains, continue Isosorbide MN ER 60 mg 1 tab daily   5. Chronic depression - continue Lamotrigine 25 mg give 2 tabs = 50 mg daily and Sertraline 50 mg Q D   6. Hypertension associatd with end stage renal disease on dialysis - well-controlled, continue Clonidine 0.1 mg 1 tab Q 8 hours PRN for SBP >180 and Amlodipine 10 mg daily   7. Anemia of chronic disease - stable Lab Results  Component Value Date   HGB 9.9 (L) 06/26/2017      8. GERD - continue Ranitidine 300 mg 1 tab Q HS      Family/ staff Communication:  Discussed plan of care with resident.  Labs/tests ordered:  BMP  Goals of care:   Long-term care   Durenda Age, NP [facility] [phone] (Monday-Friday 8:00 a.m. - 5:00 p.m.) [phone] (after hours)

## 2017-10-03 LAB — BASIC METABOLIC PANEL
BUN: 24 — AB (ref 4–21)
CREATININE: 5.2 — AB (ref 0.5–1.1)
Glucose: 99
POTASSIUM: 5 (ref 3.4–5.3)
Sodium: 136 — AB (ref 137–147)

## 2017-10-21 ENCOUNTER — Encounter: Payer: Self-pay | Admitting: Adult Health

## 2017-10-21 ENCOUNTER — Non-Acute Institutional Stay (SKILLED_NURSING_FACILITY): Payer: Medicare Other | Admitting: Adult Health

## 2017-10-21 DIAGNOSIS — C9000 Multiple myeloma not having achieved remission: Secondary | ICD-10-CM

## 2017-10-21 DIAGNOSIS — I209 Angina pectoris, unspecified: Secondary | ICD-10-CM

## 2017-10-21 DIAGNOSIS — I120 Hypertensive chronic kidney disease with stage 5 chronic kidney disease or end stage renal disease: Secondary | ICD-10-CM | POA: Diagnosis not present

## 2017-10-21 DIAGNOSIS — K219 Gastro-esophageal reflux disease without esophagitis: Secondary | ICD-10-CM | POA: Diagnosis not present

## 2017-10-21 DIAGNOSIS — I25119 Atherosclerotic heart disease of native coronary artery with unspecified angina pectoris: Secondary | ICD-10-CM

## 2017-10-21 DIAGNOSIS — F419 Anxiety disorder, unspecified: Secondary | ICD-10-CM | POA: Diagnosis not present

## 2017-10-21 DIAGNOSIS — D638 Anemia in other chronic diseases classified elsewhere: Secondary | ICD-10-CM

## 2017-10-21 DIAGNOSIS — F329 Major depressive disorder, single episode, unspecified: Secondary | ICD-10-CM

## 2017-10-21 DIAGNOSIS — Z992 Dependence on renal dialysis: Secondary | ICD-10-CM

## 2017-10-21 DIAGNOSIS — N186 End stage renal disease: Secondary | ICD-10-CM | POA: Diagnosis not present

## 2017-10-21 DIAGNOSIS — F32A Depression, unspecified: Secondary | ICD-10-CM

## 2017-10-21 NOTE — Progress Notes (Signed)
Location:  [place] Room Number: 109-A Place of Service:  SNF (31) Provider:  Durenda Age, NP  Patient Care Team: Hendricks Limes, MD as PCP - General (Internal Medicine) Medina-Vargas, Senaida Lange, NP as Nurse Practitioner (Internal Medicine)  Extended Emergency Contact Information Primary Emergency Contact: Wilburn Mylar [place] of [place] Phone: [phone] Mobile Phone: [phone] Relation: Daughter Secondary Emergency Contact: Odie Sera [place] of [place] Mobile Phone: [phone] Relation: Brother  Code Status:  Full Code  Goals of care: Advanced Directive information Advanced Directives 06/26/2017  Does Patient Have a Medical Advance Directive? No  Type of Advance Directive -  Does patient want to make changes to medical advance directive? -  Copy of [place] in Chart? -  Would patient like information on creating a medical advance directive? -     Chief Complaint  Patient presents with  . Medical Management of Chronic Issues    Routine [facility] visit    HPI:  Pt is a 77 y.o. female seen today for medical management of chronic diseases.  She is a long-term care resident of [facility].  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 She was seen attending a bible study. She continues to have chemotherapy for her multiple myeloma @ [facility]. No noted adverse reactions.    Past Medical History:  Diagnosis Date  . Anxiety   . Closed fracture of right distal femur ([place]) 09/01/2015  . Depression   . ESRD (end stage renal disease) on dialysis [facility])    "TTS; [facility]" (04/24/2017)  . GERD (gastroesophageal reflux disease) 04/23/2015  . HCAP (healthcare-associated pneumonia) 04/24/2017  . Heart murmur   . History of blood transfusion    "low HgB when I was going to dialysis center"  .  Hypertension   . Hypertension associatd with end stage renal disease on dialysis 03/11/2014  . Malnutrition of moderate degree 09/02/2015  . Multiple myeloma ([place]) 03/11/2014  . Patient is Jehovah's Witness    "I'd rather never have any blood transfusions unless absolutely necessary; please check with me 1st". (04/24/2017)  . Pneumonia ~ 2016   Past Surgical History:  Procedure Laterality Date  . AV FISTULA PLACEMENT Left   . I&D EXTREMITY Left 02/27/2016   Procedure: ARTHROSCOPIC IRRIGATION AND DEBRIDEMENT EXTREMITY;  Surgeon: Renette Butters, MD;  Location: [place];  Service: Orthopedics;  Laterality: Left;  . TEE WITHOUT CARDIOVERSION N/A 02/29/2016   Procedure: TRANSESOPHAGEAL ECHOCARDIOGRAM (TEE);  Surgeon: Thayer Headings, MD;  Location: [facility] ENDOSCOPY;  Service: Cardiovascular;  Laterality: N/A;    No Known Allergies  Outpatient Encounter Medications as of 10/21/2017  Medication Sig  . acetaminophen (TYLENOL) 325 MG tablet Take 650 mg by mouth every 6 (six) hours as needed for fever.   Marland Kitchen albuterol (PROVENTIL) (2.5 MG/3ML) 0.083% nebulizer solution Take 2.5 mg by nebulization every 6 (six) hours as needed for wheezing or shortness of breath.  Marland Kitchen amLODipine (NORVASC) 10 MG tablet Take 10 mg by mouth See admin instructions. HOLD UNTIL AFTER DIALYSIS ON TUES/THURS/SAT AND HOLD FOR SYSTOLIC B/P READING OF 932 OR LESS  . bisacodyl (BISAC-EVAC) 10 MG suppository Place 10 mg rectally once as needed (FOR CONSTIPATION NOT RELIEVED BY MILK OF MAGNESIA).   . calcium acetate (PHOSLO) 667 MG capsule Take 1,334 mg by mouth 3 (three) times daily.   . cloNIDine (CATAPRES) 0.1 MG tablet Take  0.1 mg by mouth every 8 (eight) hours as needed (for a systolic B/P of 494 or greater).   . hydrocortisone cream 1 % Apply 1 application topically daily as needed (for hemorrhoids).   . isosorbide mononitrate (IMDUR) 60 MG 24 hr tablet Take 60 mg by mouth daily.   Marland Kitchen lamoTRIgine (LAMICTAL) 25 MG tablet Take 25 mg by mouth  daily.  Marland Kitchen latanoprost (XALATAN) 0.005 % ophthalmic solution Place 1 drop into both eyes at bedtime.  Marland Kitchen loperamide (IMODIUM A-D) 2 MG tablet Take 2 mg by mouth See admin instructions. AFTER EACH LOOSE STOOL/NOT TO EXCEED 6 TABLETS IN 24 HOURS FOR 2 DAYS (MAY ALSO TAKE 4 MG AS NEEDED PRE-DIALYSIS)  . LORazepam (ATIVAN) 0.5 MG tablet Take 1 tablet (0.5 mg total) by mouth at bedtime.  . magnesium hydroxide (MILK OF MAGNESIA) 400 MG/5ML suspension Take 15-30 mLs by mouth once as needed for mild constipation.  . multivitamin (RENA-VIT) TABS tablet Take 1 tablet by mouth daily.  . ondansetron (ZOFRAN ODT) 4 MG disintegrating tablet Take 1 tablet (4 mg total) by mouth every 8 (eight) hours as needed for nausea or vomiting.  . promethazine (PHENERGAN) 25 MG tablet Take 25 mg by mouth every 6 (six) hours as needed for nausea or vomiting.  . ranitidine (ZANTAC) 300 MG tablet Take 300 mg by mouth at bedtime. Reported on 03/05/2016  . sennosides-docusate sodium (SENOKOT-S) 8.6-50 MG tablet Take 2 tablets by mouth at bedtime.  . sertraline (ZOLOFT) 50 MG tablet Take 50 mg by mouth daily.   . sevelamer carbonate (RENVELA) 800 MG tablet Take 1,600 mg by mouth 3 (three) times daily with meals.   . simethicone (MYLICON) 496 MG chewable tablet Take 1 tablet by mouth prior to each meal.  . Sodium Phosphates (RA SALINE ENEMA) 19-7 GM/118ML ENEM Place 1 enema rectally once as needed (if constipation not relieved by Dulcolax suppository). CALL DOCTOR IF NO RELIEF FROM THIS  . valACYclovir (VALTREX) 500 MG tablet Take 1 tablet (500 mg total) by mouth every other day.  . [DISCONTINUED] LamoTRIgine 50 MG TBDP Take 50 mg by mouth daily.    No facility-administered encounter medications on file as of 10/21/2017.     Review of Systems  GENERAL: No change in appetite, no fatigue, no weight changes, no fever, chills or weakness MOUTH and THROAT: Denies oral discomfort, gingival pain or bleeding RESPIRATORY: no cough, SOB,  DOE, wheezing, hemoptysis CARDIAC: No chest pain, edema or palpitations GI: No abdominal pain, diarrhea, constipation, heart burn, nausea or vomiting GU: Denies dysuria, frequency, hematuria, incontinence, or discharge PSYCHIATRIC: Denies feelings of depression or anxiety. No report of hallucinations, insomnia, paranoia, or agitation   Immunization History  Administered Date(s) Administered  . Influenza,inj,Quad PF,6+ Mos 07/23/2014, 08/01/2016  . PPD Test 04/13/2015   Pertinent  Health Maintenance Due  Topic Date Due  . INFLUENZA VACCINE  07/31/2018 (Originally 05/01/2017)  . PNA vac Low Risk Adult (1 of 2 - PCV13) 08/29/2018 (Originally 03/20/2004)  . DEXA SCAN  11/28/2018 (Originally 03/20/2004)   Fall Risk  06/06/2017 11/09/2016 06/15/2016 05/25/2016 05/23/2016  Falls in the past year? No No No No No  Number falls in past yr: - - - - -  Injury with Fall? - - - - -  Risk Factor Category  - - - - -  Risk for fall due to : - - - - -  Risk for fall due to: Comment - - - - -  Follow up - - - - -  Vitals:   10/21/17 1103  BP: 119/74  Pulse: (!) 54  Resp: 20  Temp: 98.7 F (37.1 C)  TempSrc: Oral  SpO2: 95%  Weight: 137 lb 11.5 oz (62.5 kg)  Height: '5\' 6"'  (1.676 m)   Body mass index is 22.23 kg/m.  Physical Exam  GENERAL APPEARANCE: Well nourished. In no acute distress. Normal body habitus SKIN:  Skin is warm and dry.  MOUTH and THROAT: Lips are without lesions. Oral mucosa is moist and without lesions. Tongue is normal in shape, size, and color and without lesions RESPIRATORY: Breathing is even & unlabored, BS CTAB CARDIAC: RRR, no murmur,no extra heart sounds, BLE trace edema, LUE AV Fistula + bruit/thrill GI: Abdomen soft, normal BS, no masses, no tenderness EXTREMITIES:  Able to move X 4 extremities PSYCHIATRIC: Alert and oriented X 3. Affect and behavior are appropriate   Labs reviewed: Recent Labs    04/25/17 0730 04/26/17 0417 06/26/17 2324 06/29/17 10/03/17    NA 133* 136 131* 137 136*  K 5.8* 4.3 6.2* 5.4* 5.0  CL 98* 100* 94*  --   --   CO2 21* 28 25  --   --   GLUCOSE 72 73 84  --   --   BUN 72* 22* 73* 55* 24*  CREATININE 9.82* 4.63* 10.29* 9.0* 5.2*  CALCIUM 7.5* 8.2* 9.2  --   --    Recent Labs    04/23/17 04/24/17 1158 06/26/17 2324  AST 16 96* 16  ALT 8 26 8*  ALKPHOS 117 112 86  BILITOT  --  0.9 0.5  PROT  --  7.6 7.9  ALBUMIN  --  3.2* 3.4*   Recent Labs    04/25/17 0730 04/26/17 0417 05/21/17 06/20/17 06/26/17 1824  WBC 8.6 10.2 3.6 10.1 4.1  NEUTROABS  --   --  2 9 2.5  HGB 9.2* 9.3* 8.3* 9.3* 9.9*  HCT 29.2* 28.8* 27* 30* 31.3*  MCV 101.4* 99.3  --   --  105.7*  PLT 72* 60* 161 179 165   Lab Results  Component Value Date   TSH 1.457 03/13/2016   Lab Results  Component Value Date   HGBA1C 4.9 03/13/2016    Assessment/Plan  1. Anemia of chronic disease - stable Lab Results  Component Value Date   HGB 9.9 (L) 06/26/2017     2. Hypertension associatd with end stage renal disease on dialysis - well-controlled, continue clonidine 0.1 mg 1 tab every 8 hours when necessary and amlodipine 10 mg 1 tab daily   3. Multiple myeloma, remission status unspecified (HCC) - currently on chemotherapy at [facility] with dexamethasone 20 mg PO and Granix 30 g subcutaneous; valacyclovir 500 mg 1 tab every other day for prophylaxis   4. Chronic depression - mood is stable, continue lamotrigine 25 mg give 2 tabs daily and sertraline 50 mg 1 tab daily   5. Anxiety - mood is stable, continue Ativan 0.5 mg 1 tab every at bedtime   6. ESRD (end stage renal disease) ([place]) - goes to hemodialysis 3 times/week, continue sevelamer carbonate 800 mg give 2 tabs = 1600 mg 3 times a day   7. Gastroesophageal reflux disease without esophagitis - stable, continue ranitidine 300 mg 1 tab daily at bedtime   8. Coronary artery disease involving native coronary artery of native heart with angina pectoris (HCC) - no  complaints of chest pain, continue isosorbide MN ER 60 mg 1 tab daily     Family/  staff Communication: Discussed plan of care with resident .  Labs/tests ordered:  None  Goals of care:   Long-term care   Durenda Age, NP [facility] [phone] (Monday-Friday 8:00 a.m. - 5:00 p.m.) [phone] (after hours)

## 2017-11-25 ENCOUNTER — Encounter: Payer: Self-pay | Admitting: Adult Health

## 2017-11-25 ENCOUNTER — Non-Acute Institutional Stay (SKILLED_NURSING_FACILITY): Payer: Medicare Other | Admitting: Adult Health

## 2017-11-25 DIAGNOSIS — C9000 Multiple myeloma not having achieved remission: Secondary | ICD-10-CM | POA: Diagnosis not present

## 2017-11-25 DIAGNOSIS — F32A Depression, unspecified: Secondary | ICD-10-CM

## 2017-11-25 DIAGNOSIS — D638 Anemia in other chronic diseases classified elsewhere: Secondary | ICD-10-CM | POA: Diagnosis not present

## 2017-11-25 DIAGNOSIS — K219 Gastro-esophageal reflux disease without esophagitis: Secondary | ICD-10-CM

## 2017-11-25 DIAGNOSIS — Z992 Dependence on renal dialysis: Secondary | ICD-10-CM | POA: Diagnosis not present

## 2017-11-25 DIAGNOSIS — F39 Unspecified mood [affective] disorder: Secondary | ICD-10-CM | POA: Diagnosis not present

## 2017-11-25 DIAGNOSIS — F329 Major depressive disorder, single episode, unspecified: Secondary | ICD-10-CM | POA: Diagnosis not present

## 2017-11-25 DIAGNOSIS — I120 Hypertensive chronic kidney disease with stage 5 chronic kidney disease or end stage renal disease: Secondary | ICD-10-CM

## 2017-11-25 DIAGNOSIS — N186 End stage renal disease: Secondary | ICD-10-CM | POA: Diagnosis not present

## 2017-11-25 DIAGNOSIS — I25119 Atherosclerotic heart disease of native coronary artery with unspecified angina pectoris: Secondary | ICD-10-CM

## 2017-11-25 DIAGNOSIS — F419 Anxiety disorder, unspecified: Secondary | ICD-10-CM

## 2017-11-25 NOTE — Progress Notes (Signed)
Location:  [place] Room Number: 109-A Place of Service:  SNF (31) Provider:  Durenda Age, NP  Patient Care Team: Hendricks Limes, MD as PCP - General (Internal Medicine) Medina-Vargas, Senaida Lange, NP as Nurse Practitioner (Internal Medicine)  Extended Emergency Contact Information Primary Emergency Contact: Wilburn Mylar [place] of [place] Phone: [phone] Mobile Phone: [phone] Relation: Daughter Secondary Emergency Contact: Odie Sera [place] of [place] Mobile Phone: [phone] Relation: Brother  Code Status:  Full Code  Goals of care: Advanced Directive information Advanced Directives 06/26/2017  Does Patient Have a Medical Advance Directive? No  Type of Advance Directive -  Does patient want to make changes to medical advance directive? -  Copy of [place] in Chart? -  Would patient like information on creating a medical advance directive? -     Chief Complaint  Patient presents with  . Medical Management of Chronic Issues    Routine [facility] visit    HPI:  Pt is a 77 y.o. [name] seen today for medical management of chronic diseases. She is a long-term care resident of [facility].  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 She was seen in her room today. She has requested for a lipid panel. She had a bible study this morning.She is currently having chemotherapy (Granix) for multiple myeloma.   Past Medical History:  Diagnosis Date  . Anxiety   . Closed fracture of right distal femur ([place]) 09/01/2015  . Depression   . ESRD (end stage renal disease) on dialysis [facility])    "TTS; [facility]" (04/24/2017)  . GERD (gastroesophageal reflux disease) 04/23/2015  . HCAP (healthcare-associated pneumonia) 04/24/2017  . Heart murmur   . History of blood transfusion    "low HgB when I was going to  dialysis center"  . Hypertension   . Hypertension associatd with end stage renal disease on dialysis 03/11/2014  . Malnutrition of moderate degree 09/02/2015  . Multiple myeloma ([place]) 03/11/2014  . Patient is Jehovah's Witness    "I'd rather never have any blood transfusions unless absolutely necessary; please check with me 1st". (04/24/2017)  . Pneumonia ~ 2016   Past Surgical History:  Procedure Laterality Date  . AV FISTULA PLACEMENT Left   . I&D EXTREMITY Left 02/27/2016   Procedure: ARTHROSCOPIC IRRIGATION AND DEBRIDEMENT EXTREMITY;  Surgeon: Renette Butters, MD;  Location: [place];  Service: Orthopedics;  Laterality: Left;  . TEE WITHOUT CARDIOVERSION N/A 02/29/2016   Procedure: TRANSESOPHAGEAL ECHOCARDIOGRAM (TEE);  Surgeon: Thayer Headings, MD;  Location: [facility] ENDOSCOPY;  Service: Cardiovascular;  Laterality: N/A;    No Known Allergies  Outpatient Encounter Medications as of 11/25/2017  Medication Sig  . acetaminophen (TYLENOL) 325 MG tablet Take 650 mg by mouth every 6 (six) hours as needed for fever.   Marland Kitchen albuterol (PROVENTIL) (2.5 MG/3ML) 0.083% nebulizer solution Take 2.5 mg by nebulization every 6 (six) hours as needed for wheezing or shortness of breath.  Marland Kitchen amLODipine (NORVASC) 10 MG tablet Take 10 mg by mouth See admin instructions. HOLD UNTIL AFTER DIALYSIS ON TUES/THURS/SAT AND HOLD FOR SYSTOLIC B/P READING OF 160 OR LESS  . bisacodyl (BISAC-EVAC) 10 MG suppository Place 10 mg rectally once as needed (FOR CONSTIPATION NOT RELIEVED BY MILK OF MAGNESIA).   . calcium acetate (PHOSLO) 667 MG capsule Take 1,334 mg by mouth 3 (three) times daily.   . cloNIDine (CATAPRES)  0.1 MG tablet Take 0.1 mg by mouth every 8 (eight) hours as needed (for a systolic B/P of 937 or greater).   . hydrocortisone cream 1 % Apply 1 application topically daily as needed (for hemorrhoids).   . isosorbide mononitrate (IMDUR) 60 MG 24 hr tablet Take 60 mg by mouth daily.   Marland Kitchen lamoTRIgine (LAMICTAL) 25 MG tablet  Take 25 mg by mouth daily.  Marland Kitchen latanoprost (XALATAN) 0.005 % ophthalmic solution Place 1 drop into both eyes at bedtime.   Marland Kitchen LORazepam (ATIVAN) 0.5 MG tablet Take 1 tablet (0.5 mg total) by mouth at bedtime.  Marland Kitchen losartan (COZAAR) 50 MG tablet Take 50 mg by mouth at bedtime.  . magnesium hydroxide (MILK OF MAGNESIA) 400 MG/5ML suspension Take 15-30 mLs by mouth once as needed for mild constipation.  . multivitamin (RENA-VIT) TABS tablet Take 1 tablet by mouth daily.  . ondansetron (ZOFRAN ODT) 4 MG disintegrating tablet Take 1 tablet (4 mg total) by mouth every 8 (eight) hours as needed for nausea or vomiting.  . promethazine (PHENERGAN) 25 MG tablet Take 25 mg by mouth every 6 (six) hours as needed for nausea or vomiting.  . ranitidine (ZANTAC) 300 MG tablet Take 300 mg by mouth at bedtime. Reported on 03/05/2016  . sennosides-docusate sodium (SENOKOT-S) 8.6-50 MG tablet Take 2 tablets by mouth at bedtime.   . sertraline (ZOLOFT) 50 MG tablet Take 50 mg by mouth daily.   . sevelamer carbonate (RENVELA) 800 MG tablet Take 1,600 mg by mouth 3 (three) times daily with meals.   . simethicone (MYLICON) 169 MG chewable tablet Chew 125 mg by mouth. Take 1 tablet by mouth prior to each meal at 8AM, 11AM, and 6PM  . Sodium Phosphates (RA SALINE ENEMA) 19-7 GM/118ML ENEM Place 1 enema rectally once as needed (if constipation not relieved by Dulcolax suppository). CALL DOCTOR IF NO RELIEF FROM THIS  . valACYclovir (VALTREX) 500 MG tablet Take 1 tablet (500 mg total) by mouth every other day.  . [DISCONTINUED] loperamide (IMODIUM A-D) 2 MG tablet Take 2 mg by mouth See admin instructions. AFTER EACH LOOSE STOOL/NOT TO EXCEED 6 TABLETS IN 24 HOURS FOR 2 DAYS (MAY ALSO TAKE 4 MG AS NEEDED PRE-DIALYSIS)   No facility-administered encounter medications on file as of 11/25/2017.     Review of Systems  GENERAL: No change in appetite, no fatigue, no weight changes, no fever, chills or weakness MOUTH and THROAT:  Denies oral discomfort, gingival pain or bleeding, pain from teeth or hoarseness   RESPIRATORY: no cough, SOB, DOE, wheezing, hemoptysis CARDIAC: No chest pain, or palpitations GI: No abdominal pain, diarrhea, constipation, heart burn, nausea or vomiting GU: Denies dysuria, frequency, hematuria, incontinence, or discharge PSYCHIATRIC: Denies feelings of depression or anxiety. No report of hallucinations, insomnia, paranoia, or agitation   Immunization History  Administered Date(s) Administered  . Influenza,inj,Quad PF,6+ Mos 07/23/2014, 08/01/2016  . PPD Test 04/13/2015   Pertinent  Health Maintenance Due  Topic Date Due  . INFLUENZA VACCINE  07/31/2018 (Originally 05/01/2017)  . PNA vac Low Risk Adult (1 of 2 - PCV13) 08/29/2018 (Originally 03/20/2004)  . DEXA SCAN  11/28/2018 (Originally 03/20/2004)   Fall Risk  06/06/2017 11/09/2016 06/15/2016 05/25/2016 05/23/2016  Falls in the past year? No No No No No  Number falls in past yr: - - - - -  Injury with Fall? - - - - -  Risk Factor Category  - - - - -  Risk for fall due to : - - - - -  Risk for fall due to: Comment - - - - -  Follow up - - - - -      Vitals:   11/25/17 1513  BP: 122/84  Pulse: 68  Resp: 18  Temp: 98.6 F (37 C)  TempSrc: Oral  SpO2: 96%  Weight: 141 lb 3.2 oz (64 kg)  Height: '5\' 6"'  (1.676 m)   Body mass index is 22.79 kg/m.  Physical Exam  GENERAL APPEARANCE: Well nourished. In no acute distress. Normal body habitus SKIN:  Skin is warm and dry.  MOUTH and THROAT: Lips are without lesions. Oral mucosa is moist and without lesions. Tongue is normal in shape, size, and color and without lesions RESPIRATORY: Breathing is even & unlabored, BS CTAB CARDIAC: RRR, no murmur,no extra heart sounds, RLE 2+ edema, LUE AV fistula + bruit/thrill GI: Abdomen soft, normal BS, no masses, no tenderness EXTREMITIES:  Able to move X 4 extremities PSYCHIATRIC: Alert and oriented X 3. Affect and behavior are  appropriate   Labs reviewed: Recent Labs    04/25/17 0730 04/26/17 0417 06/26/17 2324 06/29/17 10/03/17  NA 133* 136 131* 137 136*  K 5.8* 4.3 6.2* 5.4* 5.0  CL 98* 100* 94*  --   --   CO2 21* 28 25  --   --   GLUCOSE 72 73 84  --   --   BUN 72* 22* 73* 55* 24*  CREATININE 9.82* 4.63* 10.29* 9.0* 5.2*  CALCIUM 7.5* 8.2* 9.2  --   --    Recent Labs    04/23/17 04/24/17 1158 06/26/17 2324  AST 16 96* 16  ALT 8 26 8*  ALKPHOS 117 112 86  BILITOT  --  0.9 0.5  PROT  --  7.6 7.9  ALBUMIN  --  3.2* 3.4*   Recent Labs    04/25/17 0730 04/26/17 0417 05/21/17 06/20/17 06/26/17 1824  WBC 8.6 10.2 3.6 10.1 4.1  NEUTROABS  --   --  2 9 2.5  HGB 9.2* 9.3* 8.3* 9.3* 9.9*  HCT 29.2* 28.8* 27* 30* 31.3*  MCV 101.4* 99.3  --   --  105.7*  PLT 72* 60* 161 179 165   Lab Results  Component Value Date   TSH 1.457 03/13/2016   Lab Results  Component Value Date   HGBA1C 4.9 03/13/2016    Assessment/Plan  1. Multiple myeloma, remission status unspecified ([place]) - currently having chemotherapy with Granix @ [facility], takes Valacyclovir 500 mg 1 tab Q other day for prophylaxis   2. ESRD (end stage renal disease) ([place]) - hemodialysis 3X/week, TThSat, continue Calcium acetate 667 mg 2 tabs TID, Sevelamer 800 mg 2 tabs TID   3. Hypertension associatd with end stage renal disease on dialysis - well-controlled, continue Clonidine 0.1 mg 1 tab Q 8 hours PRN, Amlodipine 10 mg 1 tab daily, Losartan 50 mg 1 tab Q HS   4. Chronic depression - continue Sertraline 50 mg 1 tab daily   5. Anxiety - mood is stable, continue Ativan 0.5 mg1 tab Q HS   6. Gastroesophageal reflux disease without esophagitis - continue Ranitidine 300 mg 1 tab Q HS   7. Coronary artery disease involving native coronary artery of native heart with angina pectoris (HCC) - no complaints of chest pains, continue Isosorbide MN ER 60 mg 1 tab daily   8. Anemia of chronic disease - stable Lab Results   Component Value Date   HGB 9.9 (L) 06/26/2017     9.  Mood disorder ([place]) - continue Lamotrigine 25 mg 2 tabs daily    Family/ staff Communication: Discussed plan of care with resident.  Labs/tests ordered:  Lipid panel  Goals of care:   Long-term care   Durenda Age, NP [facility] and Adult Medicine [phone] (Monday-Friday 8:00 a.m. - 5:00 p.m.) [phone] (after hours)

## 2017-11-27 LAB — LIPID PANEL
CHOLESTEROL: 202 — AB (ref 0–200)
HDL: 77 — AB (ref 35–70)
LDL Cholesterol: 112
LDl/HDL Ratio: 2.6
Triglycerides: 65 (ref 40–160)

## 2017-12-04 ENCOUNTER — Other Ambulatory Visit: Payer: Self-pay

## 2017-12-04 MED ORDER — LORAZEPAM 0.5 MG PO TABS: 0.5000 mg | ORAL_TABLET | Freq: Every day | ORAL | 0 refills | Status: DC

## 2017-12-04 NOTE — Telephone Encounter (Signed)
Refill form faxed to [facility] [phone]  Received fax confirmation.  Drew a diagonal line through original form.  Stapled them together and put in pharmacy tote on [employer].  Nurse notified.

## 2017-12-06 LAB — HM DIABETES FOOT EXAM

## 2017-12-24 ENCOUNTER — Non-Acute Institutional Stay (SKILLED_NURSING_FACILITY): Payer: Medicare Other | Admitting: Adult Health

## 2017-12-24 ENCOUNTER — Encounter: Payer: Self-pay | Admitting: Adult Health

## 2017-12-24 DIAGNOSIS — R509 Fever, unspecified: Secondary | ICD-10-CM | POA: Diagnosis not present

## 2017-12-24 DIAGNOSIS — C9000 Multiple myeloma not having achieved remission: Secondary | ICD-10-CM

## 2017-12-24 DIAGNOSIS — R05 Cough: Secondary | ICD-10-CM

## 2017-12-24 DIAGNOSIS — N186 End stage renal disease: Secondary | ICD-10-CM

## 2017-12-24 DIAGNOSIS — B370 Candidal stomatitis: Secondary | ICD-10-CM | POA: Diagnosis not present

## 2017-12-24 NOTE — Progress Notes (Signed)
Location:  [facility] Room Number: 109-A Place of Service:  SNF (31) Provider:  Durenda Age, NP  Patient Care Team: Hendricks Limes, MD as PCP - General (Internal Medicine) Medina-Vargas, Senaida Lange, NP as Nurse Practitioner (Internal Medicine)  Extended Emergency Contact Information Primary Emergency Contact: Wilburn Mylar [place] Phone: [phone] Mobile Phone: [phone] Relation: Daughter Secondary Emergency Contact: Odie Sera [place] of [place] Mobile Phone: [phone] Relation: Brother  Code Status:  Full Code  Goals of care: Advanced Directive information Advanced Directives 06/26/2017  Does Patient Have a Medical Advance Directive? No  Type of Advance Directive -  Does patient want to make changes to medical advance directive? -  Copy of [place] in Chart? -  Would patient like information on creating a medical advance directive? -     Chief Complaint  Patient presents with  . Acute Visit    Cough, low grade fever, oral yeast, feels bad    HPI:  Pt is a 77 y.o. female seen today for an acute visit secondary to stating she doesn't feel well, has a cough and low-grade fever (T 99.3).  She refused to go to dialysis today due to the above. She was seen in the room today. She said that she gets co cold in the dialysis even though she has a blanket. She has productive cough with whitish phlegm. Noted to have white coating on her tongue. She denies having oral pain. Granix SQ was given Last 3/20 for her multiple myeloma. She is a long-term care resident of [facility].  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     Past Medical History:  Diagnosis Date  . Anxiety   . Closed fracture of right distal femur ([place]) 09/01/2015  . Depression   . ESRD (end stage renal disease) on dialysis [facility])      "TTS; [place]" (04/24/2017)  . GERD (gastroesophageal reflux disease) 04/23/2015  . HCAP (healthcare-associated pneumonia) 04/24/2017  . Heart murmur   . History of blood transfusion    "low HgB when I was going to dialysis center"  . Hypertension   . Hypertension associatd with end stage renal disease on dialysis 03/11/2014  . Malnutrition of moderate degree 09/02/2015  . Multiple myeloma ([place]) 03/11/2014  . Patient is Jehovah's Witness    "I'd rather never have any blood transfusions unless absolutely necessary; please check with me 1st". (04/24/2017)  . Pneumonia ~ 2016   Past Surgical History:  Procedure Laterality Date  . AV FISTULA PLACEMENT Left   . I&D EXTREMITY Left 02/27/2016   Procedure: ARTHROSCOPIC IRRIGATION AND DEBRIDEMENT EXTREMITY;  Surgeon: Renette Butters, MD;  Location: [facility];  Service: Orthopedics;  Laterality: Left;  . TEE WITHOUT CARDIOVERSION N/A 02/29/2016   Procedure: TRANSESOPHAGEAL ECHOCARDIOGRAM (TEE);  Surgeon: Thayer Headings, MD;  Location: [facility] ENDOSCOPY;  Service: Cardiovascular;  Laterality: N/A;    No Known Allergies  Outpatient Encounter Medications as of 12/24/2017  Medication Sig  . acetaminophen (TYLENOL) 325 MG tablet Take 650 mg by mouth every 6 (six) hours as needed for fever.   Marland Kitchen albuterol (PROVENTIL) (2.5 MG/3ML) 0.083% nebulizer solution Take 2.5 mg by nebulization every 6 (six) hours as needed for wheezing or shortness of breath.  Marland Kitchen amLODipine (NORVASC) 10 MG tablet Take 10 mg by mouth See admin instructions. HOLD UNTIL AFTER DIALYSIS ON TUES/THURS/SAT AND HOLD  FOR SYSTOLIC B/P READING OF 465 OR LESS  . bisacodyl (BISAC-EVAC) 10 MG suppository Place 10 mg rectally once as needed (FOR CONSTIPATION NOT RELIEVED BY MILK OF MAGNESIA).   . calcium acetate (PHOSLO) 667 MG capsule Take 1,334 mg by mouth 3 (three) times daily.   . cloNIDine (CATAPRES) 0.1 MG tablet Take 0.1 mg by mouth every 8 (eight) hours as needed (for a systolic B/P of 035 or  greater).   . hydrocortisone cream 1 % Apply 1 application topically daily as needed (for hemorrhoids).   . isosorbide mononitrate (IMDUR) 60 MG 24 hr tablet Take 60 mg by mouth daily.   . LamoTRIgine (LAMICTAL XR) 50 MG TB24 24 hour tablet Take 50 mg by mouth daily.  Marland Kitchen latanoprost (XALATAN) 0.005 % ophthalmic solution Place 1 drop into both eyes at bedtime.   Marland Kitchen LORazepam (ATIVAN) 0.5 MG tablet Take 1 tablet (0.5 mg total) by mouth at bedtime.  Marland Kitchen losartan (COZAAR) 50 MG tablet Take 50 mg by mouth at bedtime.  . magnesium hydroxide (MILK OF MAGNESIA) 400 MG/5ML suspension Take 15-30 mLs by mouth once as needed for mild constipation.  . multivitamin (RENA-VIT) TABS tablet Take 1 tablet by mouth daily.  . ondansetron (ZOFRAN ODT) 4 MG disintegrating tablet Take 1 tablet (4 mg total) by mouth every 8 (eight) hours as needed for nausea or vomiting.  . promethazine (PHENERGAN) 25 MG tablet Take 25 mg by mouth every 6 (six) hours as needed for nausea or vomiting.  . ranitidine (ZANTAC) 300 MG tablet Take 300 mg by mouth at bedtime. Reported on 03/05/2016  . sennosides-docusate sodium (SENOKOT-S) 8.6-50 MG tablet Take 2 tablets by mouth at bedtime.   . sertraline (ZOLOFT) 50 MG tablet Take 50 mg by mouth daily.   . sevelamer carbonate (RENVELA) 800 MG tablet Take 800 mg by mouth 3 (three) times daily with meals.   . simethicone (MYLICON) 465 MG chewable tablet Chew 125 mg by mouth. Take 1 tablet by mouth prior to each meal at 8AM, 11AM, and 6PM  . Sodium Phosphates (RA SALINE ENEMA) 19-7 GM/118ML ENEM Place 1 enema rectally once as needed (if constipation not relieved by Dulcolax suppository). CALL DOCTOR IF NO RELIEF FROM THIS  . valACYclovir (VALTREX) 500 MG tablet Take 1 tablet (500 mg total) by mouth every other day.  . [DISCONTINUED] lamoTRIgine (LAMICTAL) 25 MG tablet Take 25 mg by mouth daily. Take 2 tablets to = 50 mg qd to stabilize mood   No facility-administered encounter medications on file as  of 12/24/2017.     Review of Systems  GENERAL: No change in appetite, no fatigue, no weight changes, no fever, chills or weakness MOUTH and THROAT: Denies oral discomfort, gingival pain or bleeding RESPIRATORY: no cough, SOB, DOE, wheezing, hemoptysis CARDIAC: No chest pain, or palpitations, LUE AV fistula + bruit/thrill GI: No abdominal pain, diarrhea, constipation, heart burn, nausea or vomiting GU: Denies dysuria, frequency, hematuria, incontinence, or discharge PSYCHIATRIC: Denies feelings of depression or anxiety. No report of hallucinations, insomnia, paranoia, or agitation   Immunization History  Administered Date(s) Administered  . Influenza,inj,Quad PF,6+ Mos 07/23/2014, 08/01/2016  . PPD Test 04/13/2015   Pertinent  Health Maintenance Due  Topic Date Due  . INFLUENZA VACCINE  07/31/2018 (Originally 05/01/2017)  . PNA vac Low Risk Adult (1 of 2 - PCV13) 08/29/2018 (Originally 03/20/2004)  . DEXA SCAN  11/28/2018 (Originally 03/20/2004)   Fall Risk  06/06/2017 11/09/2016 06/15/2016 05/25/2016 05/23/2016  Falls in the past year?  No No No No No  Number falls in past yr: - - - - -  Injury with Fall? - - - - -  Risk Factor Category  - - - - -  Risk for fall due to : - - - - -  Risk for fall due to: Comment - - - - -  Follow up - - - - -      Vitals:   12/24/17 1143  BP: 120/70  Pulse: 72  Resp: 20  Temp: 99.3 F (37.4 C)  TempSrc: Oral  SpO2: 98%  Weight: 141 lb 3.2 oz (64 kg)  Height: '5\' 6"'  (1.676 m)   Body mass index is 22.79 kg/m.  Physical Exam  GENERAL APPEARANCE: Well nourished. In no acute distress. Normal body habitus SKIN:  Skin is warm and dry MOUTH and THROAT:Has thick white coating on tongue RESPIRATORY: Breathing is even & unlabored, no wheezing, has crackles on bilateral lower lung fields CARDIAC: RRR, no murmur,no extra heart sounds GI: Abdomen soft, normal BS, no masses, no tenderness EXTREMITIES:  Able to move X 4 extremities PSYCHIATRIC: Alert and  oriented X 3. Affect and behavior are appropriate   Labs reviewed: Recent Labs    04/25/17 0730 04/26/17 0417 06/26/17 2324 06/29/17 10/03/17  NA 133* 136 131* 137 136*  K 5.8* 4.3 6.2* 5.4* 5.0  CL 98* 100* 94*  --   --   CO2 21* 28 25  --   --   GLUCOSE 72 73 84  --   --   BUN 72* 22* 73* 55* 24*  CREATININE 9.82* 4.63* 10.29* 9.0* 5.2*  CALCIUM 7.5* 8.2* 9.2  --   --    Recent Labs    04/23/17 04/24/17 1158 06/26/17 2324  AST 16 96* 16  ALT 8 26 8*  ALKPHOS 117 112 86  BILITOT  --  0.9 0.5  PROT  --  7.6 7.9  ALBUMIN  --  3.2* 3.4*   Recent Labs    04/25/17 0730 04/26/17 0417 05/21/17 06/20/17 06/26/17 1824  WBC 8.6 10.2 3.6 10.1 4.1  NEUTROABS  --   --  2 9 2.5  HGB 9.2* 9.3* 8.3* 9.3* 9.9*  HCT 29.2* 28.8* 27* 30* 31.3*  MCV 101.4* 99.3  --   --  105.7*  PLT 72* 60* 161 179 165   Lab Results  Component Value Date   TSH 1.457 03/13/2016   Lab Results  Component Value Date   HGBA1C 4.9 03/13/2016   Lab Results  Component Value Date   CHOL 202 (A) 11/27/2017   HDL 77 (A) 11/27/2017   LDLCALC 112 11/27/2017   TRIG 65 11/27/2017    Assessment/Plan  1. Cough with fever - will start Robitussin 100 mg/5 ml give 10 ml PO Q 6AM, 10AM, and 6PM X 1 week, albuterol 2.5 mg/5 ml 1 nebulization Q 6AM, 6PM and 10 PM X 5 days, chest x-ray PA and lateral to rule out pneumonia   2. Oral candida - start Clotrimazole 10 mg troche 1 dissolve in mouth slowly 5X/day X 14 days, oral care daily   3. Multiple myeloma, remission status unspecified ([place]) - currently having chemotherapy at [facility], had Granix SQ on 12/18/17   4. ESRD (end stage renal disease) ([place]) - continue hemodialysis 3X/week     Family/ staff Communication: Discussed plan of care with resident and charge nurse.  Labs/tests ordered:  Chest x-ray  Goals of care:  Long-term care  Dionis Autry Medina-Vargas,  NP [facility] [phone] (Monday-Friday 8:00 a.m. -  5:00 p.m.) [phone] (after hours)

## 2017-12-26 ENCOUNTER — Non-Acute Institutional Stay (SKILLED_NURSING_FACILITY): Payer: Medicare Other | Admitting: Internal Medicine

## 2017-12-26 ENCOUNTER — Encounter: Payer: Self-pay | Admitting: Internal Medicine

## 2017-12-26 DIAGNOSIS — N186 End stage renal disease: Secondary | ICD-10-CM

## 2017-12-26 DIAGNOSIS — C9000 Multiple myeloma not having achieved remission: Secondary | ICD-10-CM

## 2017-12-26 DIAGNOSIS — Z992 Dependence on renal dialysis: Secondary | ICD-10-CM

## 2017-12-26 DIAGNOSIS — I120 Hypertensive chronic kidney disease with stage 5 chronic kidney disease or end stage renal disease: Secondary | ICD-10-CM

## 2017-12-26 DIAGNOSIS — R9389 Abnormal findings on diagnostic imaging of other specified body structures: Secondary | ICD-10-CM

## 2017-12-26 NOTE — Assessment & Plan Note (Addendum)
Modified incentive spirometry discussed with the patient to prevent atelectasis. No further intervention at this time as she is asymptomatic. Nebulized or MDI bronchodilators as needed for clinical bronchospasm

## 2017-12-26 NOTE — Assessment & Plan Note (Signed)
HD 3 X / week

## 2017-12-26 NOTE — Progress Notes (Signed)
NURSING HOME LOCATION:  [facility] ROOM NUMBER:  109-A  CODE STATUS:  Full Code  PCP:  Hendricks Limes, MD  [place] [address]  This is a nursing facility follow up of chronic medical diagnoses.   Interim medical record and care since last [facility] visit was updated with review of diagnostic studies and change in clinical status since last visit were documented.  HPI: Patient is a permanent resident of the facility. Medical diagnoses include end-stage renal disease on hemodialysis 3 days weekly; essential hypertension; GERD; chronic anemia; thrombocytopenia; diastolic dysfunction; and multiple myeloma. She was seen acutely 12/24/16 for malaise, cough, and low-grade fever up to 99.3. Because of the symptoms  patient declined dialysis that day. Cough was productive of whitish phlegm. Lower lobe rales were documented. Robitussin and bronchodilators were initiated. Clotrimazole trouches were ordered for possible oral candidiasis on exam. Clinically it was felt she had a respiratory syncytial virus which was present among residents in the facility.  Portable chest x-ray suggested right mid field infiltrate. This was reported to have cleared partially compared to 05/20/17 mobile imaging film.[facility] film 9/26 suggested miniscule fluid in the right fissure but no infiltrate. There was minimal right lower lobe atelectasis on that film.All 3 films were personally reviewed. The fact that there was no infiltrate on the 06/26/17 film suggests that this represents intermittent fluid in the right major fissure.  Review of systems: She denies any active upper or lower respiratory tract symptoms @ this time.  Constitutional: No fever, significant weight change, fatigue  Eyes: No redness, discharge, pain, vision change ENT/mouth: No nasal congestion,  purulent discharge, earache, change in hearing, sore throat  Cardiovascular: No chest pain, palpitations, paroxysmal nocturnal  dyspnea, claudication, edema  Respiratory: No active cough, sputum production, hemoptysis, DOE , significant snoring, apnea   Gastrointestinal: No heartburn, dysphagia, abdominal pain, nausea /vomiting, rectal bleeding, melena, change in bowels Genitourinary: No dysuria, hematuria, pyuria Musculoskeletal: No joint stiffness, joint swelling, weakness, pain Dermatologic: No rash, pruritus, change in appearance of skin Neurologic: No dizziness, headache, syncope, seizures, numbness, tingling Psychiatric: No significant anxiety, depression, insomnia, anorexia Endocrine: No change in hair/skin/ nails, excessive thirst, excessive hunger, excessive urination  Hematologic/lymphatic: No significant bruising, lymphadenopathy, abnormal bleeding Allergy/immunology: No itchy/watery eyes, significant sneezing, urticaria, angioedema  Physical exam:  Pertinent or positive findings: The patient was agitated and impatient as she was preparing to go to dialysis. When I expressed my concerns about her x-rays she did allow a limited exam. Breath sounds tended to be decreased but there were inspiratory pops and wheezes in the right posterior thorax. Grade 1.5 systolic murmur present. No peripheral edema is noted. There is no lymphadenopathy about the head or neck.  General appearance: Adequately nourished; no acute distress, increased work of breathing is present.   Lymphatic: No lymphadenopathy about the head, neck, axilla. Eyes: No conjunctival inflammation or lid edema is present. There is no scleral icterus. Ears:  External ear exam shows no significant lesions or deformities.   Nose:  External nasal examination shows no deformity or inflammation Oral exam:  Lips and gums are healthy appearing.  Neck:  No thyromegaly, masses, tenderness noted.    Heart:  Normal rate and regular rhythm. S1 and S2 normal without gallop,click, rub .  Lungs: without  rhonchi,rales , rubs. Extremities:  No cyanosis, clubbing,edema    Skin: Warm & dry  No significant lesions or rash.  See summary under each active problem in the  Problem List with associated updated therapeutic plan

## 2017-12-26 NOTE — Assessment & Plan Note (Signed)
As per Oncology 

## 2017-12-26 NOTE — Patient Instructions (Addendum)
See assessment and plan under each diagnosis in the problem list and acutely for this visit Total time 26  minutes; greater than 50% of the visit spent reviewing images from [facility]  & mobile imaging and counseling patient and coordinating care for problems addressed at this encounter

## 2018-01-01 ENCOUNTER — Other Ambulatory Visit: Payer: Self-pay

## 2018-01-01 MED ORDER — LORAZEPAM 0.5 MG PO TABS: 0.5000 mg | ORAL_TABLET | Freq: Every day | ORAL | 0 refills | Status: DC

## 2018-01-01 NOTE — Telephone Encounter (Signed)
Faxed refill to [facility]  [phone]

## 2018-01-08 ENCOUNTER — Encounter: Payer: Self-pay | Admitting: Adult Health

## 2018-01-08 NOTE — Progress Notes (Signed)
Location:  [place] Room Number: 109-A Place of Service:  SNF (31) Provider:  Durenda Age, NP  Patient Care Team: Hendricks Limes, MD as PCP - General (Internal Medicine) Medina-Vargas, Senaida Lange, NP as Nurse Practitioner (Internal Medicine)  Extended Emergency Contact Information Primary Emergency Contact: Wilburn Mylar [place] of [place] Phone: [phone] Mobile Phone: [phone] Relation: Daughter Secondary Emergency Contact: Odie Sera [place] of [place] Mobile Phone: [phone] Relation: Brother  Code Status:  Full Code  Goals of care: Advanced Directive information Advanced Directives 06/26/2017  Does Patient Have a Medical Advance Directive? No  Type of Advance Directive -  Does patient want to make changes to medical advance directive? -  Copy of [place] in Chart? -  Would patient like information on creating a medical advance directive? -     Chief Complaint  Patient presents with  . Advanced Directive    Care plan meeting    HPI:  Pt is a 77 y.o. female seen today for a care plan meeting.  She is a long-term care resident of [facility].  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    Past Medical History:  Diagnosis Date  . Anxiety   . Closed fracture of right distal femur ([place]) 09/01/2015  . Depression   . ESRD (end stage renal disease) on dialysis [facility])    "TTS; [facility]" (04/24/2017)  . GERD (gastroesophageal reflux disease) 04/23/2015  . HCAP (healthcare-associated pneumonia) 04/24/2017  . Heart murmur   . History of blood transfusion    "low HgB when I was going to dialysis center"  . Hypertension   . Hypertension associatd with end stage renal disease on dialysis 03/11/2014  . Malnutrition of moderate degree 09/02/2015  . Multiple myeloma ([place]) 03/11/2014  . Patient  is Jehovah's Witness    "I'd rather never have any blood transfusions unless absolutely necessary; please check with me 1st". (04/24/2017)  . Pneumonia ~ 2016   Past Surgical History:  Procedure Laterality Date  . AV FISTULA PLACEMENT Left   . I&D EXTREMITY Left 02/27/2016   Procedure: ARTHROSCOPIC IRRIGATION AND DEBRIDEMENT EXTREMITY;  Surgeon: Renette Butters, MD;  Location: [place];  Service: Orthopedics;  Laterality: Left;  . TEE WITHOUT CARDIOVERSION N/A 02/29/2016   Procedure: TRANSESOPHAGEAL ECHOCARDIOGRAM (TEE);  Surgeon: Thayer Headings, MD;  Location: [facility] ENDOSCOPY;  Service: Cardiovascular;  Laterality: N/A;    No Known Allergies  Outpatient Encounter Medications as of 01/08/2018  Medication Sig  . acetaminophen (TYLENOL) 325 MG tablet Take 650 mg by mouth every 6 (six) hours as needed for fever.   Marland Kitchen albuterol (PROVENTIL) (2.5 MG/3ML) 0.083% nebulizer solution Take 2.5 mg by nebulization every 6 (six) hours as needed for wheezing or shortness of breath.  Marland Kitchen amLODipine (NORVASC) 10 MG tablet Take 10 mg by mouth See admin instructions. HOLD UNTIL AFTER DIALYSIS ON TUES/THURS/SAT AND HOLD FOR SYSTOLIC B/P READING OF 169 OR LESS  . bisacodyl (BISAC-EVAC) 10 MG suppository Place 10 mg rectally once as needed (FOR CONSTIPATION NOT RELIEVED BY MILK OF MAGNESIA).   . calcium acetate (PHOSLO) 667 MG capsule Take 1,334 mg by mouth 3 (three) times daily.   . cloNIDine (CATAPRES) 0.1 MG tablet Take 0.1 mg by mouth every 8 (eight) hours as needed (for a systolic B/P of 450 or greater).   . clotrimazole (MYCELEX) 10 MG troche Take 10  mg by mouth 5 (five) times daily.  . hydrocortisone cream 1 % Apply 1 application topically every 8 (eight) hours as needed for itching. Apply to rectum for hemorrhoids  . isosorbide mononitrate (IMDUR) 60 MG 24 hr tablet Take 60 mg by mouth daily.   . LamoTRIgine (LAMICTAL XR) 50 MG TB24 24 hour tablet Take 50 mg by mouth daily.  Marland Kitchen latanoprost (XALATAN) 0.005 %  ophthalmic solution Place 1 drop into both eyes at bedtime.   Marland Kitchen LORazepam (ATIVAN) 0.5 MG tablet Take 1 tablet (0.5 mg total) by mouth at bedtime.  Marland Kitchen losartan (COZAAR) 50 MG tablet Take 50 mg by mouth at bedtime.  . magnesium hydroxide (MILK OF MAGNESIA) 400 MG/5ML suspension Take 15-30 mLs by mouth once as needed for mild constipation.  . multivitamin (RENA-VIT) TABS tablet Take 1 tablet by mouth daily.  . ondansetron (ZOFRAN ODT) 4 MG disintegrating tablet Take 1 tablet (4 mg total) by mouth every 8 (eight) hours as needed for nausea or vomiting.  . promethazine (PHENERGAN) 25 MG tablet Take 25 mg by mouth every 6 (six) hours as needed for nausea or vomiting.  . ranitidine (ZANTAC) 300 MG tablet Take 300 mg by mouth at bedtime. Reported on 03/05/2016  . sennosides-docusate sodium (SENOKOT-S) 8.6-50 MG tablet Take 2 tablets by mouth at bedtime.   . sertraline (ZOLOFT) 50 MG tablet Take 50 mg by mouth daily.   . sevelamer carbonate (RENVELA) 800 MG tablet Take 800 mg by mouth 3 (three) times daily with meals.   . simethicone (MYLICON) 408 MG chewable tablet Chew 125 mg by mouth. Take 1 tablet by mouth prior to each meal at 8AM, 11AM, and 6PM  . Sodium Phosphates (RA SALINE ENEMA) 19-7 GM/118ML ENEM Place 1 enema rectally once as needed (if constipation not relieved by Dulcolax suppository). CALL DOCTOR IF NO RELIEF FROM THIS  . valACYclovir (VALTREX) 500 MG tablet Take 1 tablet (500 mg total) by mouth every other day.   No facility-administered encounter medications on file as of 01/08/2018.     Review of Systems  GENERAL: No change in appetite, no fatigue, no weight changes, no fever, chills or weakness SKIN: Denies rash, itching, wounds, ulcer sores, or nail abnormalities EYES: Denies change in vision, dry eyes, eye pain, itching or discharge EARS: Denies change in hearing, ringing in ears, or earache NOSE: Denies nasal congestion or epistaxis MOUTH and THROAT: Denies oral discomfort,  gingival pain or bleeding, pain from teeth or hoarseness   RESPIRATORY: no cough, SOB, DOE, wheezing, hemoptysis CARDIAC: No chest pain, edema or palpitations GI: No abdominal pain, diarrhea, constipation, heart burn, nausea or vomiting GU: Denies dysuria, frequency, hematuria, incontinence, or discharge MUSCULOSKELETAL: Denies joint pain, muscle pain, back pain, restricted movement, or unusual weakness CIRCULATION: Denies claudication, edema of legs, varicosities, or cold extremities NEUROLOGICAL: Denies dizziness, syncope, numbness, or headache PSYCHIATRIC: Denies feelings of depression or anxiety. No report of hallucinations, insomnia, paranoia, or agitation ENDOCRINE: Denies polyphagia, polyuria, polydipsia, heat or cold intolerance HEME/LYMPH: Denies excessive bruising, petechia, enlarged lymph nodes, or bleeding problems IMMUNOLOGIC: Denies history of frequent infections, AIDS, or use of immunosuppressive agents   Immunization History  Administered Date(s) Administered  . Influenza,inj,Quad PF,6+ Mos 07/23/2014, 08/01/2016  . PPD Test 04/13/2015   Pertinent  Health Maintenance Due  Topic Date Due  . INFLUENZA VACCINE  07/31/2018 (Originally 05/01/2018)  . PNA vac Low Risk Adult (1 of 2 - PCV13) 08/29/2018 (Originally 03/20/2004)  . DEXA SCAN  11/28/2018 (Originally 03/20/2004)  Fall Risk  06/06/2017 11/09/2016 06/15/2016 05/25/2016 05/23/2016  Falls in the past year? No No No No No  Number falls in past yr: - - - - -  Injury with Fall? - - - - -  Risk Factor Category  - - - - -  Risk for fall due to : - - - - -  Risk for fall due to: Comment - - - - -  Follow up - - - - -     Vitals:   01/08/18 1212  BP: 103/71  Pulse: 62  Resp: 17  Temp: 97.8 F (36.6 C)  TempSrc: Oral  SpO2: 98%  Weight: 139 lb 14.4 oz (63.5 kg)  Height: '5\' 6"'  (1.676 m)   Body mass index is 22.58 kg/m.  Physical Exam  GENERAL APPEARANCE: Well nourished. In no acute distress. Normal body  habitus SKIN:  Skin is warm and dry. There are no suspicious lesions or rash HEAD: Normal in size and contour. No evidence of trauma EYES: Lids open and close normally. No blepharitis, entropion or ectropion. PERRL. Conjunctivae are clear and sclerae are white. Lenses are without opacity EARS: Pinnae are normal. Patient hears normal voice tunes of the examiner MOUTH and THROAT: Lips are without lesions. Oral mucosa is moist and without lesions. Tongue is normal in shape, size, and color and without lesions NECK: supple, trachea midline, no neck masses, no thyroid tenderness, no thyromegaly LYMPHATICS: No LAN in the neck, no supraclavicular LAN RESPIRATORY: Breathing is even & unlabored, BS CTAB CARDIAC: RRR, no murmur,no extra heart sounds, no edema GI: Abdomen soft, normal BS, no masses, no tenderness, no hepatomegaly, no splenomegaly MUSCULOSKELETAL: No deformities. Movement at each extremity is full and painless. Strength is 5/5 at each extremity. Back is without kyphosis or scoliosis CIRCULATION: Pedal pulses are 2+. There is no edema of the legs, ankles and feet NEUROLOGICAL: There is no tremor. Speech is clear PSYCHIATRIC: Alert and oriented X 3. Affect and behavior are appropriate  Labs reviewed: Recent Labs    04/25/17 0730 04/26/17 0417 06/26/17 2324 06/29/17 10/03/17  NA 133* 136 131* 137 136*  K 5.8* 4.3 6.2* 5.4* 5.0  CL 98* 100* 94*  --   --   CO2 21* 28 25  --   --   GLUCOSE 72 73 84  --   --   BUN 72* 22* 73* 55* 24*  CREATININE 9.82* 4.63* 10.29* 9.0* 5.2*  CALCIUM 7.5* 8.2* 9.2  --   --    Recent Labs    04/23/17 04/24/17 1158 06/26/17 2324  AST 16 96* 16  ALT 8 26 8*  ALKPHOS 117 112 86  BILITOT  --  0.9 0.5  PROT  --  7.6 7.9  ALBUMIN  --  3.2* 3.4*   Recent Labs    04/25/17 0730 04/26/17 0417 05/21/17 06/20/17 06/26/17 1824  WBC 8.6 10.2 3.6 10.1 4.1  NEUTROABS  --   --  2 9 2.5  HGB 9.2* 9.3* 8.3* 9.3* 9.9*  HCT 29.2* 28.8* 27* 30* 31.3*  MCV  101.4* 99.3  --   --  105.7*  PLT 72* 60* 161 179 165   Lab Results  Component Value Date   TSH 1.457 03/13/2016   Lab Results  Component Value Date   HGBA1C 4.9 03/13/2016   Lab Results  Component Value Date   CHOL 202 (A) 11/27/2017   HDL 77 (A) 11/27/2017   LDLCALC 112 11/27/2017   TRIG 65 11/27/2017  Durenda Age, NP [facility] [phone] (Monday-Friday 8:00 a.m. - 5:00 p.m.) [phone] (after hours)  This encounter was created in error - please disregard.

## 2018-01-24 ENCOUNTER — Encounter: Payer: Self-pay | Admitting: Adult Health

## 2018-01-24 ENCOUNTER — Non-Acute Institutional Stay (SKILLED_NURSING_FACILITY): Payer: Medicare Other | Admitting: Adult Health

## 2018-01-24 DIAGNOSIS — I2510 Atherosclerotic heart disease of native coronary artery without angina pectoris: Secondary | ICD-10-CM

## 2018-01-24 DIAGNOSIS — N186 End stage renal disease: Secondary | ICD-10-CM | POA: Diagnosis not present

## 2018-01-24 DIAGNOSIS — F329 Major depressive disorder, single episode, unspecified: Secondary | ICD-10-CM

## 2018-01-24 DIAGNOSIS — C9000 Multiple myeloma not having achieved remission: Secondary | ICD-10-CM | POA: Diagnosis not present

## 2018-01-24 DIAGNOSIS — R112 Nausea with vomiting, unspecified: Secondary | ICD-10-CM | POA: Diagnosis not present

## 2018-01-24 DIAGNOSIS — F32A Depression, unspecified: Secondary | ICD-10-CM

## 2018-01-24 DIAGNOSIS — I10 Essential (primary) hypertension: Secondary | ICD-10-CM | POA: Diagnosis not present

## 2018-01-24 NOTE — Progress Notes (Signed)
Location:  [place] Room Number: 109-A Place of Service:  SNF (31) Provider:  Durenda Age, NP  Patient Care Team: Hendricks Limes, MD as PCP - General (Internal Medicine) Medina-Vargas, Senaida Lange, NP as Nurse Practitioner (Internal Medicine)  Extended Emergency Contact Information Primary Emergency Contact: Wilburn Mylar [place] of [place] Phone: [phone] Mobile Phone: [phone] Relation: Daughter Secondary Emergency Contact: Odie Sera [place] of [place] Mobile Phone: [phone] Relation: Brother  Code Status:  Full Code  Goals of care: Advanced Directive information Advanced Directives 06/26/2017  Does Patient Have a Medical Advance Directive? No  Type of Advance Directive -  Does patient want to make changes to medical advance directive? -  Copy of [place] in Chart? -  Would patient like information on creating a medical advance directive? -     Chief Complaint  Patient presents with  . Medical Management of Chronic Issues    Routine [facility] visit    HPI:  Pt is a 77 y.o. female seen today for medical management of chronic diseases.  She is a long-term care resident of [facility].  She has a PMH of ESRD on hemodialysis, multiple myeloma, hypertension, leukopenia, anemia due to neoplastic disease, and GERD. She was seen in the room today. She reported vomiting X 1 today after she has taken the medication and feels" better" now . No reported fever. She has multiple myeloma and currently on infusions.    Past Medical History:  Diagnosis Date  . Anxiety   . Closed fracture of right distal femur ([place]) 09/01/2015  . Depression   . ESRD (end stage renal disease) on dialysis [facility])    "TTS; [facility]" (04/24/2017)  . GERD (gastroesophageal reflux disease) 04/23/2015  . HCAP (healthcare-associated pneumonia) 04/24/2017  . Heart murmur   . History of blood  transfusion    "low HgB when I was going to dialysis center"  . Hypertension   . Hypertension associatd with end stage renal disease on dialysis 03/11/2014  . Malnutrition of moderate degree 09/02/2015  . Multiple myeloma ([place]) 03/11/2014  . Patient is Jehovah's Witness    "I'd rather never have any blood transfusions unless absolutely necessary; please check with me 1st". (04/24/2017)  . Pneumonia ~ 2016   Past Surgical History:  Procedure Laterality Date  . AV FISTULA PLACEMENT Left   . I&D EXTREMITY Left 02/27/2016   Procedure: ARTHROSCOPIC IRRIGATION AND DEBRIDEMENT EXTREMITY;  Surgeon: Renette Butters, MD;  Location: [place];  Service: Orthopedics;  Laterality: Left;  . TEE WITHOUT CARDIOVERSION N/A 02/29/2016   Procedure: TRANSESOPHAGEAL ECHOCARDIOGRAM (TEE);  Surgeon: Thayer Headings, MD;  Location: [facility] ENDOSCOPY;  Service: Cardiovascular;  Laterality: N/A;    No Known Allergies  Outpatient Encounter Medications as of 01/24/2018  Medication Sig  . amLODipine (NORVASC) 10 MG tablet Take 10 mg by mouth See admin instructions. HOLD UNTIL AFTER DIALYSIS ON TUES/THURS/SAT AND HOLD FOR SYSTOLIC B/P READING OF 395 OR LESS  . bisacodyl (BISAC-EVAC) 10 MG suppository Place 10 mg rectally once as needed (FOR CONSTIPATION NOT RELIEVED BY MILK OF MAGNESIA).   . calcium acetate (PHOSLO) 667 MG capsule Take 1,334 mg by mouth 3 (three) times daily.   . cloNIDine (CATAPRES) 0.1 MG tablet Take 0.1 mg by mouth every 8 (eight) hours as needed (for a systolic B/P of 320 or greater).   . hydrocortisone cream 1 % Apply 1 application topically every 8 (eight) hours as needed  for itching. Apply to rectum for hemorrhoids  . isosorbide mononitrate (IMDUR) 60 MG 24 hr tablet Take 60 mg by mouth daily.   . LamoTRIgine (LAMICTAL XR) 50 MG TB24 24 hour tablet Take 50 mg by mouth daily.  Marland Kitchen latanoprost (XALATAN) 0.005 % ophthalmic solution Place 1 drop into both eyes at bedtime.   Marland Kitchen LORazepam (ATIVAN) 0.5 MG tablet  Take 1 tablet (0.5 mg total) by mouth at bedtime.  Marland Kitchen losartan (COZAAR) 50 MG tablet Take 50 mg by mouth at bedtime.  . magnesium hydroxide (MILK OF MAGNESIA) 400 MG/5ML suspension Take 15-30 mLs by mouth once as needed for mild constipation.  . multivitamin (RENA-VIT) TABS tablet Take 1 tablet by mouth daily.  . ondansetron (ZOFRAN ODT) 4 MG disintegrating tablet Take 1 tablet (4 mg total) by mouth every 8 (eight) hours as needed for nausea or vomiting.  . promethazine (PHENERGAN) 25 MG tablet Take 25 mg by mouth every 6 (six) hours as needed for nausea or vomiting.  . ranitidine (ZANTAC) 300 MG tablet Take 300 mg by mouth at bedtime. Reported on 03/05/2016  . sennosides-docusate sodium (SENOKOT-S) 8.6-50 MG tablet Take 2 tablets by mouth at bedtime.   . sertraline (ZOLOFT) 50 MG tablet Take 50 mg by mouth daily.   . sevelamer carbonate (RENVELA) 800 MG tablet Take 800 mg by mouth 3 (three) times daily with meals.   . simethicone (MYLICON) 416 MG chewable tablet Chew 125 mg by mouth. Take 1 tablet by mouth prior to each meal at 8AM, 11AM, and 6PM  . Sodium Phosphates (RA SALINE ENEMA) 19-7 GM/118ML ENEM Place 1 enema rectally once as needed (if constipation not relieved by Dulcolax suppository). CALL DOCTOR IF NO RELIEF FROM THIS  . valACYclovir (VALTREX) 500 MG tablet Take 1 tablet (500 mg total) by mouth every other day.  . [DISCONTINUED] acetaminophen (TYLENOL) 325 MG tablet Take 650 mg by mouth every 6 (six) hours as needed for fever.   . [DISCONTINUED] albuterol (PROVENTIL) (2.5 MG/3ML) 0.083% nebulizer solution Take 2.5 mg by nebulization every 6 (six) hours as needed for wheezing or shortness of breath.   No facility-administered encounter medications on file as of 01/24/2018.     Review of Systems  GENERAL: No change in appetite, no fatigue, no weight changes, no fever, chills or weakness MOUTH and THROAT: Denies oral discomfort, gingival pain  RESPIRATORY: no cough, SOB, DOE, wheezing,  hemoptysis CARDIAC: No chest pain, edema or palpitations GI: +vomiting GU: Denies dysuria, frequency, hematuria, incontinence, or discharge PSYCHIATRIC: Denies feelings of depression or anxiety. No report of hallucinations, insomnia, paranoia, or agitation   Immunization History  Administered Date(s) Administered  . Influenza,inj,Quad PF,6+ Mos 07/23/2014, 08/01/2016  . PPD Test 04/13/2015   Pertinent  Health Maintenance Due  Topic Date Due  . INFLUENZA VACCINE  07/31/2018 (Originally 05/01/2018)  . PNA vac Low Risk Adult (1 of 2 - PCV13) 08/29/2018 (Originally 03/20/2004)  . DEXA SCAN  11/28/2018 (Originally 03/20/2004)   Fall Risk  06/06/2017 11/09/2016 06/15/2016 05/25/2016 05/23/2016  Falls in the past year? No No No No No  Number falls in past yr: - - - - -  Injury with Fall? - - - - -  Risk Factor Category  - - - - -  Risk for fall due to : - - - - -  Risk for fall due to: Comment - - - - -  Follow up - - - - -      Vitals:  01/24/18 1024  BP: 130/76  Pulse: 62  Resp: 17  Temp: 97.8 F (36.6 C)  TempSrc: Oral  SpO2: 98%  Weight: 139 lb 7.7 oz (63.3 kg)  Height: '5\' 6"'  (1.676 m)   Body mass index is 22.51 kg/m.  Physical Exam  GENERAL APPEARANCE: Well nourished. Normal body habitus SKIN:  Skin is warm and dry.  MOUTH and THROAT: Lips are without lesions. Oral mucosa is moist and without lesions. Tongue is normal in shape, size, and color and without lesions RESPIRATORY: Breathing is even & unlabored, BS CTAB CARDIAC: RRR, no murmur,no extra heart sounds, no edema GI: Abdomen soft, normal BS, no masses, no tenderness EXTREMITIES:  Able to move X 4 extremities PSYCHIATRIC: Alert and oriented X 3. Affect and behavior are appropriate   Labs reviewed: Recent Labs    04/25/17 0730 04/26/17 0417 06/26/17 2324 06/29/17 10/03/17  NA 133* 136 131* 137 136*  K 5.8* 4.3 6.2* 5.4* 5.0  CL 98* 100* 94*  --   --   CO2 21* 28 25  --   --   GLUCOSE 72 73 84  --   --   BUN  72* 22* 73* 55* 24*  CREATININE 9.82* 4.63* 10.29* 9.0* 5.2*  CALCIUM 7.5* 8.2* 9.2  --   --    Recent Labs    04/23/17 04/24/17 1158 06/26/17 2324  AST 16 96* 16  ALT 8 26 8*  ALKPHOS 117 112 86  BILITOT  --  0.9 0.5  PROT  --  7.6 7.9  ALBUMIN  --  3.2* 3.4*   Recent Labs    04/25/17 0730 04/26/17 0417 05/21/17 06/20/17 06/26/17 1824  WBC 8.6 10.2 3.6 10.1 4.1  NEUTROABS  --   --  2 9 2.5  HGB 9.2* 9.3* 8.3* 9.3* 9.9*  HCT 29.2* 28.8* 27* 30* 31.3*  MCV 101.4* 99.3  --   --  105.7*  PLT 72* 60* 161 179 165   Lab Results  Component Value Date   TSH 1.457 03/13/2016   Lab Results  Component Value Date   HGBA1C 4.9 03/13/2016   Lab Results  Component Value Date   CHOL 202 (A) 11/27/2017   HDL 77 (A) 11/27/2017   LDLCALC 112 11/27/2017   TRIG 65 11/27/2017     Assessment/Plan  1. Multiple myeloma, remission status unspecified ([place]) - follows up with oncology and currently on infusions  2. ESRD (end stage renal disease) ([place]) - on hemodialysis 3X/week   3. Essential hypertension - well-controlled, continue Clonidine   4. Nausea and vomiting, intractability of vomiting not specified, unspecified vomiting type - continue Promethazine PRN   5. Coronary artery disease involving native heart without angina pectoris, unspecified vessel or lesion type - no complaints of chest pains, continue Isosorbide MN ER 60 mg daily  6. Chronic depression - mod is stable, continue Sertraline 50 mg daily    Family/ staff Communication:  Discussed plan of care with resident.  Labs/tests ordered:  BMP  Goals of care:   Long-term care   Durenda Age, NP [facility] [phone] (Monday-Friday 8:00 a.m. - 5:00 p.m.) [phone] (after hours)

## 2018-01-31 ENCOUNTER — Other Ambulatory Visit: Payer: Self-pay

## 2018-01-31 MED ORDER — LORAZEPAM 0.5 MG PO TABS: 0.5000 mg | ORAL_TABLET | Freq: Every day | ORAL | 0 refills | Status: DC

## 2018-02-13 ENCOUNTER — Emergency Department (HOSPITAL_COMMUNITY)
Admission: EM | Admit: 2018-02-13 | Discharge: 2018-02-13 | Disposition: A | Discharge status: Home / Self Care | Payer: Medicare Other | Attending: Emergency Medicine | Admitting: Emergency Medicine

## 2018-02-13 ENCOUNTER — Emergency Department (HOSPITAL_COMMUNITY): Payer: Medicare Other

## 2018-02-13 ENCOUNTER — Encounter (HOSPITAL_COMMUNITY): Payer: Self-pay

## 2018-02-13 DIAGNOSIS — Y999 Unspecified external cause status: Secondary | ICD-10-CM | POA: Insufficient documentation

## 2018-02-13 DIAGNOSIS — W19XXXA Unspecified fall, initial encounter: Secondary | ICD-10-CM

## 2018-02-13 DIAGNOSIS — Y939 Activity, unspecified: Secondary | ICD-10-CM | POA: Insufficient documentation

## 2018-02-13 DIAGNOSIS — I120 Hypertensive chronic kidney disease with stage 5 chronic kidney disease or end stage renal disease: Secondary | ICD-10-CM | POA: Diagnosis not present

## 2018-02-13 DIAGNOSIS — N186 End stage renal disease: Secondary | ICD-10-CM | POA: Diagnosis not present

## 2018-02-13 DIAGNOSIS — Z992 Dependence on renal dialysis: Secondary | ICD-10-CM | POA: Diagnosis not present

## 2018-02-13 DIAGNOSIS — Y929 Unspecified place or not applicable: Secondary | ICD-10-CM | POA: Insufficient documentation

## 2018-02-13 DIAGNOSIS — W050XXA Fall from non-moving wheelchair, initial encounter: Secondary | ICD-10-CM | POA: Insufficient documentation

## 2018-02-13 DIAGNOSIS — S40022A Contusion of left upper arm, initial encounter: Secondary | ICD-10-CM | POA: Diagnosis not present

## 2018-02-13 DIAGNOSIS — S59912A Unspecified injury of left forearm, initial encounter: Secondary | ICD-10-CM | POA: Diagnosis present

## 2018-02-13 DIAGNOSIS — T148XXA Other injury of unspecified body region, initial encounter: Secondary | ICD-10-CM

## 2018-02-13 MED ORDER — GUAIFENESIN 100 MG/5ML PO SOLN
5.0000 mL | Freq: Once | ORAL | Status: AC
  Administered 2018-02-13: 100 mg via ORAL
  Filled 2018-02-13 (×2): qty 5

## 2018-02-13 NOTE — ED Notes (Signed)
Dr Ray at bedside. 

## 2018-02-13 NOTE — Discharge Instructions (Addendum)
No broken bones seen on x-[name] There is a hematoma of the arm which is a collection of blood.  You may see some increased discoloration as this breaks down and is being resorbed by your body Return if you have any change in pain or note any new injuries
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
## 2018-02-13 NOTE — ED Provider Notes (Signed)
[facility] EMERGENCY DEPARTMENT Provider Note   CSN: [id number] Arrival date & time: 02/13/18  1724     History   Chief Complaint Chief Complaint  Patient presents with  . Fall    HPI [name] is a 77 y.o. female.  HPI  77 year old female end-stage renal disease who fell out of her wheelchair while returning from dialysis.  EMS reports that the lift was going down when it suddenly fell down to the ground.  She was still sitting the chair and it jarred her.  It was followed by the chair flipping backwards.  However, an attendant caught her head.  She struck her left arm is complaining of some pain and bruising in the left arm.  There is no loss of consciousness.  She denies any neck pain.  She is nonambulatory and has not walked since this occurred.  Past Medical History:  Diagnosis Date  . Anxiety   . Closed fracture of right distal femur ([place]) 09/01/2015  . Depression   . ESRD (end stage renal disease) on dialysis [facility])    "TTS; [place]" (04/24/2017)  . GERD (gastroesophageal reflux disease) 04/23/2015  . HCAP (healthcare-associated pneumonia) 04/24/2017  . Heart murmur   . History of blood transfusion    "low HgB when I was going to dialysis center"  . Hypertension   . Hypertension associatd with end stage renal disease on dialysis 03/11/2014  . Malnutrition of moderate degree 09/02/2015  . Multiple myeloma ([place]) 03/11/2014  . Patient is Jehovah's Witness    "I'd rather never have any blood transfusions unless absolutely necessary; please check with me 1st". (04/24/2017)  . Pneumonia ~ 2016    Patient Active Problem List   Diagnosis Date Noted  . Abnormal chest x-Jacier Gladu 12/26/2017  . HCAP (healthcare-associated pneumonia) 04/24/2017  . Sepsis ([place]) 04/24/2017  . Nausea and vomiting 04/24/2017  . Thrombocytopenia ([place]) 04/24/2017  . Hyperkalemia 01/16/2017  . IBS (irritable bowel syndrome) 12/27/2016  . Mood disorder ([place]) 07/19/2016  . Diastolic  dysfunction 35/32/9924  . Insomnia 04/27/2016  . Hyponatremia 03/12/2016  . Symptomatic anemia 03/12/2016  . Diarrhea 03/12/2016  . Essential hypertension   . Swelling of joint of left knee 02/18/2016  . Leukopenia due to antineoplastic chemotherapy ([place]) 09/03/2015  . Malnutrition of moderate degree 09/02/2015  . GERD (gastroesophageal reflux disease) 04/23/2015  . 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 Multiple myeloma ([place]) 03/11/2014  . Hypertension associatd with end stage renal disease on dialysis 03/11/2014    Past Surgical History:  Procedure Laterality Date  . AV FISTULA PLACEMENT Left   . I&D EXTREMITY Left 02/27/2016   Procedure: ARTHROSCOPIC IRRIGATION AND DEBRIDEMENT EXTREMITY;  Surgeon: Renette Butters, MD;  Location: [place];  Service: Orthopedics;  Laterality: Left;  . TEE WITHOUT CARDIOVERSION N/A 02/29/2016   Procedure: TRANSESOPHAGEAL ECHOCARDIOGRAM (TEE);  Surgeon: Thayer Headings, MD;  Location: [place];  Service: Cardiovascular;  Laterality: N/A;     OB History   None      Home Medications    Prior to Admission medications   Medication Sig Start Date End Date Taking? Authorizing Provider  amLODipine (NORVASC) 10 MG tablet Take 10 mg by mouth See admin instructions. HOLD UNTIL AFTER DIALYSIS ON TUES/THURS/SAT AND HOLD FOR SYSTOLIC B/P READING OF 268 OR LESS    [provider]  bisacodyl (BISAC-EVAC) 10 MG suppository Place 10 mg rectally once as needed (FOR CONSTIPATION NOT RELIEVED BY MILK OF MAGNESIA).  [provider]  calcium acetate (PHOSLO) 667 MG capsule Take 1,334 mg by mouth 3 (three) times daily.     [provider]  cloNIDine (CATAPRES) 0.1 MG tablet Take 0.1 mg by mouth every 8 (eight) hours as needed (for a systolic B/P of 878 or greater).     [provider]  hydrocortisone cream 1 % Apply 1 application topically every 8 (eight) hours as needed for itching. Apply to rectum for hemorrhoids     [provider]  isosorbide mononitrate (IMDUR) 60 MG 24 hr tablet Take 60 mg by mouth daily.  03/24/15   [provider]  LamoTRIgine (LAMICTAL XR) 50 MG TB24 24 hour tablet Take 50 mg by mouth daily.    [provider]  latanoprost (XALATAN) 0.005 % ophthalmic solution Place 1 drop into both eyes at bedtime.     [provider]  LORazepam (ATIVAN) 0.5 MG tablet Take 1 tablet (0.5 mg total) by mouth at bedtime. 01/31/18   Medina-Vargas, Monina C, NP  losartan (COZAAR) 50 MG tablet Take 50 mg by mouth at bedtime.    [provider]  magnesium hydroxide (MILK OF MAGNESIA) 400 MG/5ML suspension Take 15-30 mLs by mouth once as needed for mild constipation.    [provider]  multivitamin (RENA-VIT) TABS tablet Take 1 tablet by mouth daily.    [provider]  ondansetron (ZOFRAN ODT) 4 MG disintegrating tablet Take 1 tablet (4 mg total) by mouth every 8 (eight) hours as needed for nausea or vomiting. 01/21/15   Everlene Balls, MD  promethazine (PHENERGAN) 25 MG tablet Take 25 mg by mouth every 6 (six) hours as needed for nausea or vomiting.    [provider]  ranitidine (ZANTAC) 300 MG tablet Take 300 mg by mouth at bedtime. Reported on 03/05/2016    [provider]  sennosides-docusate sodium (SENOKOT-S) 8.6-50 MG tablet Take 2 tablets by mouth at bedtime.     [provider]  sertraline (ZOLOFT) 50 MG tablet Take 50 mg by mouth daily.     [provider]  sevelamer carbonate (RENVELA) 800 MG tablet Take 800 mg by mouth 3 (three) times daily with meals.     [provider]  simethicone (MYLICON) 676 MG chewable tablet Chew 125 mg by mouth. Take 1 tablet by mouth prior to each meal at 8AM, 11AM, and 6PM    [provider]  Sodium Phosphates (RA SALINE ENEMA) 19-7 GM/118ML ENEM Place 1 enema rectally once as needed (if constipation not relieved by Dulcolax suppository). CALL DOCTOR IF NO RELIEF  FROM THIS    [provider]  valACYclovir (VALTREX) 500 MG tablet Take 1 tablet (500 mg total) by mouth every other day. 03/01/16   Hongalgi, Lenis Dickinson, MD    Family History Family History  Problem Relation Age of Onset  . Hypertension Mother   . Hypertension Father     Social History Social History   Tobacco Use  . Smoking status: Never Smoker  . Smokeless tobacco: Never Used  Substance Use Topics  . Alcohol use: No    Alcohol/week: 0.0 oz  . Drug use: No     Allergies   Patient has no known allergies.   Review of Systems Review of Systems  All other systems reviewed and are negative.    Physical Exam Updated Vital Signs BP (!) 158/125 (BP Location: Right Arm)   Pulse 78   Temp 98.7 F (37.1 C) (Oral)  Resp 18   SpO2 97%   Physical Exam  Constitutional: She is oriented to person, place, and time. She appears well-developed and well-nourished.  HENT:  Head: Normocephalic and atraumatic.  Right Ear: External ear normal.  Left Ear: External ear normal.  Nose: Nose normal.  Mouth/Throat: Oropharynx is clear and moist.  Eyes: Pupils are equal, round, and reactive to light. EOM are normal.  Neck: Normal range of motion. Neck supple.  No tenderness palpation of her cervical spine  Cardiovascular: Normal rate.  Pulmonary/Chest: Effort normal and breath sounds normal.  No external signs of trauma on chest  Abdominal: Soft.  No signs of trauma in abdomen.  Abdomen is soft and nontender to palpation  Musculoskeletal: She exhibits tenderness.  Hematoma noted posterior left upper arm with anterior aspect with dialysis access in place  Neurological: She is alert and oriented to person, place, and time. She displays normal reflexes. No cranial nerve deficit. She exhibits normal muscle tone.  Plan adjusting up some of his current medications.Back examined and no signs of trauma with no tenderness palpation over thoracic or lumbar spine  Skin: Skin is warm and  dry. Capillary refill takes less than 2 seconds.  Nursing note and vitals reviewed.    ED Treatments / Results  Labs (all labs ordered are listed, but only abnormal results are displayed) Labs Reviewed - No data to display  EKG None  Radiology Dg Humerus Left  Result Date: 02/13/2018 CLINICAL DATA:  Left upper extremity pain status post fall. EXAM: LEFT HUMERUS - 2+ VIEW COMPARISON:  None. FINDINGS: There is no evidence of fracture or other focal bone lesions. Vascular clips within the medial soft tissues noted. IMPRESSION: No acute fracture or dislocation identified about the left humerus. Electronically Signed   By: Fidela Salisbury M.D.   On: 02/13/2018 18:32    Procedures Procedures (including critical care time)  Medications Ordered in ED Medications - No data to display   Initial Impression / Assessment and Plan / ED Course  I have reviewed the triage vital signs and the nursing notes.  Pertinent labs & imaging results that were available during my care of the patient were reviewed by me and considered in my medical decision making (see chart for details).    77 year old female presents after fall from wheelchair with hematoma left upper arm.  No other injuries noted on exam.  X-[name] of left humerus without any evidence of fracture.  She is discharged home in improved condition.  Final Clinical Impressions(s) / ED Diagnoses   Final diagnoses:  Fall, initial encounter  Hematoma    ED Discharge Orders    None       Pattricia Boss, MD 02/13/18 [phone]

## 2018-02-13 NOTE — ED Notes (Signed)
Report given to Surveyor, minerals at [facility]

## 2018-02-18 ENCOUNTER — Non-Acute Institutional Stay (SKILLED_NURSING_FACILITY): Payer: Medicare Other | Admitting: Internal Medicine

## 2018-02-18 ENCOUNTER — Encounter: Payer: Self-pay | Admitting: Internal Medicine

## 2018-02-18 DIAGNOSIS — M542 Cervicalgia: Secondary | ICD-10-CM

## 2018-02-18 DIAGNOSIS — R05 Cough: Secondary | ICD-10-CM | POA: Diagnosis not present

## 2018-02-18 DIAGNOSIS — I10 Essential (primary) hypertension: Secondary | ICD-10-CM

## 2018-02-18 DIAGNOSIS — R058 Other specified cough: Secondary | ICD-10-CM

## 2018-02-18 NOTE — Progress Notes (Signed)
NURSING HOME LOCATION:  [facility] ROOM NUMBER:  [id number]  CODE STATUS:  Full Code  PCP:  Hendricks Limes, MD  [place] [id number]  This is a nursing facility follow up for specific acute COC  issue reported as  headache.  Interim medical record and care since last [place] visit was updated with review of diagnostic studies and change in clinical status since last visit were documented.  HPI: The patient awoke with a "headache" this morning which she states is "killing me". Her Nurse states patient refused ED referral or pain medication; therefore , assessment is requested.  She questioned its relationship to her near fall 02/13/17. Apparently she was in her wheelchair and falling backwards when she was caught by the transport driver. She did sustain bruising to the left upper extremity. She was seen in the emergency room. At that time she had no head or neck pain. The ED physician noted no tenderness over the neck or spine. Actually the "headache" is actually localized to the right lateral neck in the retroauricular area she is very dramatic and she describes this with tearing. Because of the neck pains she did not go to dialysis today. It was noted that she has been sleeping with 2 pillows. She stated that one pillow is too "low". Also she's concerned about a cough she's had for 2 weeks, productive of white phlegm. This has been treated with Robitussin.  She is also is concerned about edema, worse yesterday afternoon, but which improved overnight. Finally she's concerned about tenderness and pain in the area of the bruise in the left triceps area. Films in the ED revealed no fracture. Records were reviewed. I ask about her not wearing the lower dental plate, she states that she had lost it and then tearfully explained that she had no money to purchase a new plate. Blood pressure was noted to be 170/100 yesterday. She stated that it was 100 over 100. When I  questioned this twice, she finally stated recrding was 160/100.  Physical exam:  Pertinent or positive findings: As noted she describes each of her multiple concerns dramatically and tearfully. She has bilateral ptosis and arcus senilis. She does cough up clear phlegm. Chest surprisingly clear. She has a grade 1 systolic murmur. She has 1+ peripheral edema on the right and 1.5+ on the left. Strength is decreased generally but equally.Pedal pulses are decreased. Large bruise is present on the left triceps area of the upper extremity. She has decreased range of motion laterally of her neck to the right. Area of concern is tender to palpation. There are no palpable lymph nodes. She had no carotid bruit on that side.  General appearance: no acute distress, increased work of breathing is present.   Lymphatic: No lymphadenopathy about the head, neck, axilla. Eyes: No conjunctival inflammation or lid edema is present. There is no scleral icterus. Ears:  External ear exam shows no significant lesions or deformities.   Nose:  External nasal examination shows no deformity or inflammation. Nasal mucosa are pink and moist without lesions, exudates Oral exam:  Lips and gums are healthy appearing. There is no oropharyngeal erythema or exudate. Neck:  No thyromegaly, masses, tenderness noted.    Heart:  Normal rate and regular rhythm. S1 and S2 normal without gallop, click, rub .  Lungs:Chest clear to auscultation without wheezes, rhonchi,rales , rubs. Abdomen:Bowel sounds are normal. Abdomen is soft and nontender with no organomegaly, hernias,masses. GU: deferred  Extremities:  No cyanosis, clubbing  Neurologic exam : DT reflexes are equal Skin: Warm & dry w/o tenting. No significant rash.  See summary under each active problem in the Problem List with associated updated therapeutic plan

## 2018-02-18 NOTE — Patient Instructions (Signed)
See assessment and plan under each diagnosis in the problem list and acutely for this visit 

## 2018-02-18 NOTE — Assessment & Plan Note (Signed)
6/76/19 systolic blood pressure only mildly elevated and this is in the context of extreme agitation Monitor blood pressure and peripheral edema, consider changing amlodipine to another antihypertensive if the edema persists or progresses

## 2018-02-19 ENCOUNTER — Encounter: Payer: Self-pay | Admitting: Internal Medicine

## 2018-02-20 ENCOUNTER — Emergency Department (HOSPITAL_COMMUNITY): Payer: Medicare Other

## 2018-02-20 ENCOUNTER — Emergency Department (HOSPITAL_COMMUNITY)
Admission: EM | Admit: 2018-02-20 | Discharge: 2018-02-21 | Disposition: A | Discharge status: Home / Self Care | Payer: Medicare Other | Attending: Emergency Medicine | Admitting: Emergency Medicine

## 2018-02-20 ENCOUNTER — Other Ambulatory Visit: Payer: Self-pay

## 2018-02-20 ENCOUNTER — Encounter (HOSPITAL_COMMUNITY): Payer: Self-pay

## 2018-02-20 DIAGNOSIS — Z992 Dependence on renal dialysis: Secondary | ICD-10-CM | POA: Insufficient documentation

## 2018-02-20 DIAGNOSIS — Z79899 Other long term (current) drug therapy: Secondary | ICD-10-CM | POA: Insufficient documentation

## 2018-02-20 DIAGNOSIS — Y929 Unspecified place or not applicable: Secondary | ICD-10-CM | POA: Insufficient documentation

## 2018-02-20 DIAGNOSIS — S060X0D Concussion without loss of consciousness, subsequent encounter: Secondary | ICD-10-CM | POA: Insufficient documentation

## 2018-02-20 DIAGNOSIS — I120 Hypertensive chronic kidney disease with stage 5 chronic kidney disease or end stage renal disease: Secondary | ICD-10-CM | POA: Insufficient documentation

## 2018-02-20 DIAGNOSIS — Y999 Unspecified external cause status: Secondary | ICD-10-CM | POA: Insufficient documentation

## 2018-02-20 DIAGNOSIS — Y939 Activity, unspecified: Secondary | ICD-10-CM | POA: Diagnosis not present

## 2018-02-20 DIAGNOSIS — S0990XD Unspecified injury of head, subsequent encounter: Secondary | ICD-10-CM | POA: Diagnosis present

## 2018-02-20 DIAGNOSIS — N186 End stage renal disease: Secondary | ICD-10-CM | POA: Diagnosis not present

## 2018-02-20 DIAGNOSIS — W19XXXA Unspecified fall, initial encounter: Secondary | ICD-10-CM | POA: Insufficient documentation

## 2018-02-20 LAB — CBC
HCT: 28.7 % — ABNORMAL LOW (ref 36.0–46.0)
Hemoglobin: 8.7 g/dL — ABNORMAL LOW (ref 12.0–15.0)
MCH: 33.3 pg (ref 26.0–34.0)
MCHC: 30.3 g/dL (ref 30.0–36.0)
MCV: 110 fL — ABNORMAL HIGH (ref 78.0–100.0)
PLATELETS: 113 10*3/uL — AB (ref 150–400)
RBC: 2.61 MIL/uL — AB (ref 3.87–5.11)
RDW: 17.9 % — ABNORMAL HIGH (ref 11.5–15.5)
WBC: 9.2 10*3/uL (ref 4.0–10.5)

## 2018-02-20 LAB — COMPREHENSIVE METABOLIC PANEL
ALK PHOS: 46 U/L (ref 38–126)
ALT: 12 U/L — ABNORMAL LOW (ref 14–54)
ANION GAP: 9 (ref 5–15)
AST: 31 U/L (ref 15–41)
Albumin: 3.1 g/dL — ABNORMAL LOW (ref 3.5–5.0)
BILIRUBIN TOTAL: 0.7 mg/dL (ref 0.3–1.2)
BUN: 18 mg/dL (ref 6–20)
CALCIUM: 8.6 mg/dL — AB (ref 8.9–10.3)
CO2: 30 mmol/L (ref 22–32)
CREATININE: 6.03 mg/dL — AB (ref 0.44–1.00)
Chloride: 95 mmol/L — ABNORMAL LOW (ref 101–111)
GFR, EST AFRICAN AMERICAN: 7 mL/min — AB (ref >60)
GFR, EST NON AFRICAN AMERICAN: 6 mL/min — AB (ref >60)
Glucose, Bld: 96 mg/dL (ref 65–99)
Potassium: 4 mmol/L (ref 3.5–5.1)
SODIUM: 134 mmol/L — AB (ref 135–145)
TOTAL PROTEIN: 9.5 g/dL — AB (ref 6.5–8.1)

## 2018-02-20 NOTE — ED Triage Notes (Signed)
Pt arrives to ED from [facility] with complaints of one bloody BM this morning per pt, no other witnesses. EMS reports pt experienced a fall last week and was seen here but family is requesting pt receive a CT scan of her head since she did not have one after the fall. Pt A&Ox4, hx of dialysis. Pt placed in position of comfort with bed locked and lowered, call bell in reach.

## 2018-02-20 NOTE — ED Notes (Signed)
Patient states that when she wiped this morning; she noticed blood on the toilet paper. No bleeding noted since.

## 2018-02-21 NOTE — Discharge Instructions (Addendum)
Return here as needed. Follow up with your doctor. °

## 2018-02-26 ENCOUNTER — Encounter: Payer: Self-pay | Admitting: Adult Health

## 2018-02-26 ENCOUNTER — Non-Acute Institutional Stay (SKILLED_NURSING_FACILITY): Payer: Medicare Other | Admitting: Adult Health

## 2018-02-26 DIAGNOSIS — F339 Major depressive disorder, recurrent, unspecified: Secondary | ICD-10-CM

## 2018-02-26 DIAGNOSIS — C9000 Multiple myeloma not having achieved remission: Secondary | ICD-10-CM

## 2018-02-26 DIAGNOSIS — K219 Gastro-esophageal reflux disease without esophagitis: Secondary | ICD-10-CM

## 2018-02-26 DIAGNOSIS — I5189 Other ill-defined heart diseases: Secondary | ICD-10-CM | POA: Diagnosis not present

## 2018-02-26 DIAGNOSIS — I10 Essential (primary) hypertension: Secondary | ICD-10-CM | POA: Diagnosis not present

## 2018-02-26 DIAGNOSIS — N186 End stage renal disease: Secondary | ICD-10-CM | POA: Diagnosis not present

## 2018-02-26 DIAGNOSIS — F419 Anxiety disorder, unspecified: Secondary | ICD-10-CM | POA: Diagnosis not present

## 2018-02-26 NOTE — Progress Notes (Signed)
Location:  [facility] Number: 109-A Place of Service:  SNF (31) Provider:  Durenda Age, NP  Patient Care Team: Hendricks Limes, MD as PCP - General (Internal Medicine) Medina-Vargas, Senaida Lange, NP as Nurse Practitioner (Internal Medicine)  Extended Emergency Contact Information Primary Emergency Contact: Wilburn Mylar [place] of [place] Phone: [phone] Mobile Phone: [phone] Relation: Daughter Secondary Emergency Contact: Odie Sera [place] of [place] Mobile Phone: [phone] Relation: Brother  Code Status:  Full Code  Goals of care: Advanced Directive information Advanced Directives 06/26/2017  Does Patient Have a Medical Advance Directive? No  Type of Advance Directive -  Does patient want to make changes to medical advance directive? -  Copy of [place] in Chart? -  Would patient like information on creating a medical advance directive? -     Chief Complaint  Patient presents with  . Medical Management of Chronic Issues    Patient is seen for a routine [facility] visit    HPI:  Pt is a 77 y.o. female seen today for medical management of chronic diseases.  She is a long-term care resident of [facility].  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 She was seen today in her room. She had a recent fall, ramp fell accidentally while being on wheelchair assisted by the driver down from [place]. She said that she "fell on her left side' while pointing to her bruise on posterior side of her left upper arm. She said that she did not hit her head. She was sent to the hospital that day for evaluation and came back to facility after negative findings. She was having headache last Friday night and she called on to her family. Family requested for her to be transferred to the ER for evaluation.  CT head done was negative and she was transferred back to facility.      Past Medical History:  Diagnosis Date  . Anxiety   . Closed fracture of right distal femur ([place]) 09/01/2015  . Depression   . ESRD (end stage renal disease) on dialysis [facility])    "TTS; [facility]" (04/24/2017)  . GERD (gastroesophageal reflux disease) 04/23/2015  . HCAP (healthcare-associated pneumonia) 04/24/2017  . Heart murmur   . History of blood transfusion    "low HgB when I was going to dialysis center"  . Hypertension   . Hypertension associatd with end stage renal disease on dialysis 03/11/2014  . Malnutrition of moderate degree 09/02/2015  . Multiple myeloma ([place]) 03/11/2014  . Patient is Jehovah's Witness    "I'd rather never have any blood transfusions unless absolutely necessary; please check with me 1st". (04/24/2017)  . Pneumonia ~ 2016   Past Surgical History:  Procedure Laterality Date  . AV FISTULA PLACEMENT Left   . I&D EXTREMITY Left 02/27/2016   Procedure: ARTHROSCOPIC IRRIGATION AND DEBRIDEMENT EXTREMITY;  Surgeon: Renette Butters, MD;  Location: [place];  Service: Orthopedics;  Laterality: Left;  . TEE WITHOUT CARDIOVERSION N/A 02/29/2016   Procedure: TRANSESOPHAGEAL ECHOCARDIOGRAM (TEE);  Surgeon: Thayer Headings, MD;  Location: [facility] ENDOSCOPY;  Service: Cardiovascular;  Laterality: N/A;    No Known Allergies  Outpatient Encounter Medications as of 02/26/2018  Medication Sig  . amLODipine (NORVASC) 10 MG tablet Take 10 mg by mouth See admin instructions. HOLD UNTIL AFTER DIALYSIS ON TUES/THURS/SAT AND HOLD FOR SYSTOLIC B/P READING OF 283 OR  LESS  . bisacodyl (BISAC-EVAC) 10 MG suppository Place 10 mg rectally once as needed (FOR CONSTIPATION NOT RELIEVED BY MILK OF MAGNESIA).   . calcium acetate (PHOSLO) 667 MG capsule Take 2,001 mg by mouth 3 (three) times daily.   . cloNIDine (CATAPRES) 0.1 MG tablet Take 0.1 mg by mouth every 8 (eight) hours as needed (for a systolic B/P of 341 or greater).     . hydrocortisone cream 1 % Apply 1 application topically every 8 (eight) hours as needed for itching. Apply to rectum for hemorrhoids  . isosorbide mononitrate (IMDUR) 60 MG 24 hr tablet Take 60 mg by mouth daily.   Marland Kitchen lamoTRIgine (LAMICTAL) 25 MG tablet Take 50 mg by mouth daily. Take 2 tablets to = 50 mg qd to stabilize seizures  . latanoprost (XALATAN) 0.005 % ophthalmic solution Place 1 drop into both eyes at bedtime.   Marland Kitchen LORazepam (ATIVAN) 0.5 MG tablet Take 1 tablet (0.5 mg total) by mouth at bedtime.  Marland Kitchen losartan (COZAAR) 50 MG tablet Take 50 mg by mouth at bedtime.  . magnesium hydroxide (MILK OF MAGNESIA) 400 MG/5ML suspension Take 15-30 mLs by mouth once as needed for mild constipation.  . multivitamin (RENA-VIT) TABS tablet Take 1 tablet by mouth daily.  . ondansetron (ZOFRAN ODT) 4 MG disintegrating tablet Take 1 tablet (4 mg total) by mouth every 8 (eight) hours as needed for nausea or vomiting.  . promethazine (PHENERGAN) 25 MG tablet Take 25 mg by mouth every 6 (six) hours as needed for nausea or vomiting.  . ranitidine (ZANTAC) 300 MG tablet Take 300 mg by mouth at bedtime. Reported on 03/05/2016  . sennosides-docusate sodium (SENOKOT-S) 8.6-50 MG tablet Take 2 tablets by mouth at bedtime.   . sertraline (ZOLOFT) 50 MG tablet Take 50 mg by mouth daily.   . sevelamer carbonate (RENVELA) 800 MG tablet Take 800 mg by mouth 3 (three) times daily with meals.   . simethicone (MYLICON) 937 MG chewable tablet Chew 125 mg by mouth 3 (three) times daily. Take 1 tablet by mouth prior to each meal at 8AM, 11AM, and 6PM  . Sodium Phosphates (RA SALINE ENEMA) 19-7 GM/118ML ENEM Place 1 enema rectally once as needed (if constipation not relieved by Dulcolax suppository). CALL DOCTOR IF NO RELIEF FROM THIS  . valACYclovir (VALTREX) 500 MG tablet Take 1 tablet (500 mg total) by mouth every other day.  . [DISCONTINUED] doxycycline (VIBRA-TABS) 100 MG tablet Take 100 mg by mouth 2 (two) times daily.  .  [DISCONTINUED] loperamide (IMODIUM A-D) 2 MG tablet Take 4 mg by mouth daily as needed (pre dialysis days).  . [DISCONTINUED] Menthol, Topical Analgesic, (BIOFREEZE) 4 % GEL Apply 1 application topically 2 (two) times daily.   No facility-administered encounter medications on file as of 02/26/2018.     Review of Systems  GENERAL: No change in appetite, no fatigue, no weight changes, no fever, chills or weakness MOUTH and THROAT: Denies oral discomfort, gingival pain or bleeding RESPIRATORY: no cough, SOB, DOE, wheezing, hemoptysis CARDIAC: No chest pain, edema or palpitations GI: No abdominal pain, diarrhea, constipation, heart burn, nausea or vomiting GU: Denies dysuria, frequency, hematuria, incontinence, or discharge PSYCHIATRIC: Denies feelings of depression or anxiety. No report of hallucinations, insomnia, paranoia, or agitation    Immunization History  Administered Date(s) Administered  . Influenza,inj,Quad PF,6+ Mos 07/23/2014, 08/01/2016  . Influenza,inj,quad, With Preservative 08/07/2017  . PPD Test 04/13/2015   Pertinent  Health Maintenance Due  Topic Date Due  .  INFLUENZA VACCINE  07/31/2018 (Originally 05/01/2018)  . PNA vac Low Risk Adult (1 of 2 - PCV13) 08/29/2018 (Originally 03/20/2004)  . DEXA SCAN  11/28/2018 (Originally 03/20/2004)   Fall Risk  06/06/2017 11/09/2016 06/15/2016 05/25/2016 05/23/2016  Falls in the past year? No No No No No  Number falls in past yr: - - - - -  Injury with Fall? - - - - -  Risk Factor Category  - - - - -  Risk for fall due to : - - - - -  Risk for fall due to: Comment - - - - -  Follow up - - - - -      Vitals:   02/26/18 0856  BP: 134/78  Pulse: 69  Resp: 20  Temp: 98 F (36.7 C)  TempSrc: Oral  SpO2: 94%  Weight: 141 lb (64 kg)  Height: '5\' 6"'  (1.676 m)   Body mass index is 22.76 kg/m.  Physical Exam  GENERAL APPEARANCE: Well nourished. In no acute distress. Normal body habitus SKIN:  Bruising noted on posterior left  upper arm MOUTH and THROAT: Lips are without lesions. Oral mucosa is moist and without lesions. Tongue is normal in shape, size, and color and without lesions RESPIRATORY: Breathing is even & unlabored, BS CTAB CARDIAC: RRR, + murmur,no extra heart sounds, no edema, Left upper arm AV fistula GI: Abdomen soft, normal BS, no masses, no tenderness EXTREMITIES:  Able to move X 4 extremities PSYCHIATRIC: Alert and oriented X 3. Affect and behavior are appropriate   Labs reviewed: Recent Labs    04/26/17 0417 06/26/17 2324 06/29/17 10/03/17 02/20/18 2030  NA 136 131* 137 136* 134*  K 4.3 6.2* 5.4* 5.0 4.0  CL 100* 94*  --   --  95*  CO2 28 25  --   --  30  GLUCOSE 73 84  --   --  96  BUN 22* 73* 55* 24* 18  CREATININE 4.63* 10.29* 9.0* 5.2* 6.03*  CALCIUM 8.2* 9.2  --   --  8.6*   Recent Labs    04/24/17 1158 06/26/17 2324 02/20/18 2030  AST 96* 16 31  ALT 26 8* 12*  ALKPHOS 112 86 46  BILITOT 0.9 0.5 0.7  PROT 7.6 7.9 9.5*  ALBUMIN 3.2* 3.4* 3.1*   Recent Labs    04/26/17 0417 05/21/17 06/20/17 06/26/17 1824 02/20/18 2030  WBC 10.2 3.6 10.1 4.1 9.2  NEUTROABS  --  2 9 2.5  --   HGB 9.3* 8.3* 9.3* 9.9* 8.7*  HCT 28.8* 27* 30* 31.3* 28.7*  MCV 99.3  --   --  105.7* 110.0*  PLT 60* 161 179 165 113*   Lab Results  Component Value Date   TSH 1.457 03/13/2016   Lab Results  Component Value Date   HGBA1C 4.9 03/13/2016   Lab Results  Component Value Date   CHOL 202 (A) 11/27/2017   HDL 77 (A) 11/27/2017   LDLCALC 112 11/27/2017   TRIG 65 11/27/2017    Significant Diagnostic Results in last 30 days:  Ct Head Wo Contrast  Result Date: 02/20/2018 CLINICAL DATA:  Recent fall. EXAM: CT HEAD WITHOUT CONTRAST TECHNIQUE: Contiguous axial images were obtained from the base of the skull through the vertex without intravenous contrast. COMPARISON:  02/17/2016 FINDINGS: Brain: There is no mass, hemorrhage or extra-axial collection. No evidence of acute cortical infarct. The  size and configuration of the ventricles and extra-axial CSF spaces are normal. Bilateral old basal ganglia  lacunar infarcts. Vascular: No hyperdense vessel or unexpected vascular calcification. Skull: Normal visualized skull base, calvarium and extracranial soft tissues. Sinuses/Orbits: Moderate right maxillary sinus mucosal thickening. No mastoid or middle ear effusion. Mucosal thickening of the left frontal recess. The orbits are normal. IMPRESSION: Old bilateral basal ganglia lacunar infarcts without acute intracranial abnormality. Electronically Signed   By: Ulyses Jarred M.D.   On: 02/20/2018 23:00   Dg Humerus Left  Result Date: 02/13/2018 CLINICAL DATA:  Left upper extremity pain status post fall. EXAM: LEFT HUMERUS - 2+ VIEW COMPARISON:  None. FINDINGS: There is no evidence of fracture or other focal bone lesions. Vascular clips within the medial soft tissues noted. IMPRESSION: No acute fracture or dislocation identified about the left humerus. Electronically Signed   By: Fidela Salisbury M.D.   On: 02/13/2018 18:32    Assessment/Plan  1. Diastolic dysfunction - no complaints of chest pains, continue Isosorbide MN ER 60 mg daily   2. Multiple myeloma, remission status unspecified ([place]) - currently having chemotherapy, continue Valacyclovir for prophylaxis   3. Essential hypertension - has occasional slight BP elevations upon review, will monitor, continue Clonidine 0.1 mg 1 tab Q 8 hours, Amlodipine 10 mg 1 tab daily, Losartan 50 mg Q HS   4. Gastroesophageal reflux disease without esophagitis - stable, continue Ranitidine 300 mg 1 tab Q HS   5. Depression, recurrtraline 50 mg dailyrent ([place]) - easily gets agitated, will continue Lamotrigine 25 mg 2 tabs daily, Sertraline  50 mg daily, followed by [facility] psych NP   6. Anxiety - continue Lorazepam 0.5 mg Q HS   7. ESRD - continue hemodialysis Q Tuesdays, Thursdays, and Saturdays, Sevelamer Carbonate 800 mg  TID     Family/ staff Communication: Discussed plan of care with resident.  Labs/tests ordered:  None  Goals of care:   Long-term care   Durenda Age, NP [facility] [phone] (Monday-Friday 8:00 a.m. - 5:00 p.m.) [phone] (after hours)

## 2018-02-26 NOTE — ED Provider Notes (Signed)
[place] EMERGENCY DEPARTMENT Provider Note   CSN: [id number] Arrival date & time: 02/20/18  2010     History   Chief Complaint Chief Complaint  Patient presents with  . Rectal Bleeding    HPI [name] is a 77 y.o. female.  HPI Patient presents to the emergency department with complaints that she is having pain in her head following a fall that occurred when she was being brought back from dialysis by transport service.  Patient states that she had a fall off of the ramp because the person transporting her did not secure her properly.  Patient states she was seen in the emergency department but did not have any imaging of her head.  The family states they would like imaging as well.  The patient has no other complaints at this time.  There is a mention in her nursing note that she had blood on the toilet paper when she wiped this morning.  The patient denies chest pain, shortness of breath, headache,blurred vision, neck pain, fever, cough, weakness, numbness, dizziness, anorexia, edema, abdominal pain, nausea, vomiting, diarrhea, rash, back pain, dysuria, hematemesis, bloody stool, near syncope, or syncope. Past Medical History:  Diagnosis Date  . Anxiety   . Closed fracture of right distal femur ([place]) 09/01/2015  . Depression   . ESRD (end stage renal disease) on dialysis [facility])    "TTS; [facility]" (04/24/2017)  . GERD (gastroesophageal reflux disease) 04/23/2015  . HCAP (healthcare-associated pneumonia) 04/24/2017  . Heart murmur   . History of blood transfusion    "low HgB when I was going to dialysis center"  . Hypertension   . Hypertension associatd with end stage renal disease on dialysis 03/11/2014  . Malnutrition of moderate degree 09/02/2015  . Multiple myeloma ([place]) 03/11/2014  . Patient is Jehovah's Witness    "I'd rather never have any blood transfusions unless absolutely necessary; please check with me 1st". (04/24/2017)  . Pneumonia ~ 2016     Patient Active Problem List   Diagnosis Date Noted  . Abnormal chest x-ray 12/26/2017  . HCAP (healthcare-associated pneumonia) 04/24/2017  . Sepsis ([place]) 04/24/2017  . Nausea and vomiting 04/24/2017  . Thrombocytopenia ([place]) 04/24/2017  . Hyperkalemia 01/16/2017  . IBS (irritable bowel syndrome) 12/27/2016  . Mood disorder ([place]) 07/19/2016  . Diastolic dysfunction 40/97/3532  . Insomnia 04/27/2016  . Hyponatremia 03/12/2016  . Symptomatic anemia 03/12/2016  . Diarrhea 03/12/2016  . Essential hypertension   . Swelling of joint of left knee 02/18/2016  . Leukopenia due to antineoplastic chemotherapy ([place]) 09/03/2015  . Malnutrition of moderate degree 09/02/2015  . GERD (gastroesophageal reflux disease) 04/23/2015  . 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 Multiple myeloma ([place]) 03/11/2014  . Hypertension associatd with end stage renal disease on dialysis 03/11/2014    Past Surgical History:  Procedure Laterality Date  . AV FISTULA PLACEMENT Left   . I&D EXTREMITY Left 02/27/2016   Procedure: ARTHROSCOPIC IRRIGATION AND DEBRIDEMENT EXTREMITY;  Surgeon: Renette Butters, MD;  Location: [place];  Service: Orthopedics;  Laterality: Left;  . TEE WITHOUT CARDIOVERSION N/A 02/29/2016   Procedure: TRANSESOPHAGEAL ECHOCARDIOGRAM (TEE);  Surgeon: Thayer Headings, MD;  Location: [place];  Service: Cardiovascular;  Laterality: N/A;     OB History   None      Home Medications    Prior to Admission medications   Medication Sig Start Date End Date Taking? Authorizing Provider  amLODipine (NORVASC) 10 MG tablet Take 10 mg by  mouth See admin instructions. HOLD UNTIL AFTER DIALYSIS ON TUES/THURS/SAT AND HOLD FOR SYSTOLIC B/P READING OF 706 OR LESS   Yes [provider]  bisacodyl (BISAC-EVAC) 10 MG suppository Place 10 mg rectally once as needed (FOR CONSTIPATION NOT RELIEVED BY MILK OF MAGNESIA).    Yes [provider]  calcium acetate (PHOSLO) 667 MG capsule  Take 2,001 mg by mouth 3 (three) times daily.    Yes [provider]  cloNIDine (CATAPRES) 0.1 MG tablet Take 0.1 mg by mouth every 8 (eight) hours as needed (for a systolic B/P of 237 or greater).    Yes [provider]  doxycycline (VIBRA-TABS) 100 MG tablet Take 100 mg by mouth 2 (two) times daily.   Yes [provider]  hydrocortisone cream 1 % Apply 1 application topically every 8 (eight) hours as needed for itching. Apply to rectum for hemorrhoids   Yes [provider]  isosorbide mononitrate (IMDUR) 60 MG 24 hr tablet Take 60 mg by mouth daily.  03/24/15  Yes [provider]  lamoTRIgine (LAMICTAL) 25 MG tablet Take 50 mg by mouth daily.   Yes [provider]  latanoprost (XALATAN) 0.005 % ophthalmic solution Place 1 drop into both eyes at bedtime.    Yes [provider]  loperamide (IMODIUM A-D) 2 MG tablet Take 4 mg by mouth daily as needed (pre dialysis days).   Yes [provider]  LORazepam (ATIVAN) 0.5 MG tablet Take 1 tablet (0.5 mg total) by mouth at bedtime. 01/31/18  Yes Medina-Vargas, Monina C, NP  losartan (COZAAR) 50 MG tablet Take 50 mg by mouth at bedtime.   Yes [provider]  magnesium hydroxide (MILK OF MAGNESIA) 400 MG/5ML suspension Take 15-30 mLs by mouth once as needed for mild constipation.   Yes [provider]  Menthol, Topical Analgesic, (BIOFREEZE) 4 % GEL Apply 1 application topically 2 (two) times daily.   Yes [provider]  multivitamin (RENA-VIT) TABS tablet Take 1 tablet by mouth daily.   Yes [provider]  ondansetron (ZOFRAN ODT) 4 MG disintegrating tablet Take 1 tablet (4 mg total) by mouth every 8 (eight) hours as needed for nausea or vomiting. 01/21/15  Yes Everlene Balls, MD  promethazine (PHENERGAN) 25 MG tablet Take 25 mg by mouth every 6 (six) hours as needed for nausea or vomiting.   Yes [provider]  ranitidine (ZANTAC) 300 MG tablet  Take 300 mg by mouth at bedtime. Reported on 03/05/2016   Yes [provider]  sennosides-docusate sodium (SENOKOT-S) 8.6-50 MG tablet Take 2 tablets by mouth at bedtime.    Yes [provider]  sertraline (ZOLOFT) 50 MG tablet Take 50 mg by mouth daily.    Yes [provider]  sevelamer carbonate (RENVELA) 800 MG tablet Take 800 mg by mouth 3 (three) times daily with meals.    Yes [provider]  simethicone (MYLICON) 628 MG chewable tablet Chew 125 mg by mouth 3 (three) times daily. Take 1 tablet by mouth prior to each meal at 8AM, 11AM, and 6PM   Yes [provider]  Sodium Phosphates (RA SALINE ENEMA) 19-7 GM/118ML ENEM Place 1 enema rectally once as needed (if constipation not relieved by Dulcolax suppository). CALL DOCTOR IF NO RELIEF FROM THIS   Yes [provider]  valACYclovir (VALTREX) 500 MG tablet Take 1 tablet (500 mg total) by mouth every other day. 03/01/16  Yes Hongalgi, Lenis Dickinson, MD    Family History  Family History  Problem Relation Age of Onset  . Hypertension Mother   . Hypertension Father     Social History Social History   Tobacco Use  . Smoking status: Never Smoker  . Smokeless tobacco: Never Used  Substance Use Topics  . Alcohol use: No    Alcohol/week: 0.0 oz  . Drug use: No     Allergies   Patient has no known allergies.   Review of Systems Review of Systems All other systems negative except as documented in the HPI. All pertinent positives and negatives as reviewed in the HPI.  Physical Exam Updated Vital Signs BP (!) 144/95   Pulse 69   Resp 20   Ht '5\' 6"'$  (1.676 m)   Wt 64 kg (141 lb)   SpO2 98%   BMI 22.76 kg/m   Physical Exam  Constitutional: She is oriented to person, place, and time. She appears well-developed and well-nourished. No distress.  HENT:  Head: Normocephalic and atraumatic.  Mouth/Throat: Oropharynx is clear and moist.  Eyes: Pupils are equal, round, and reactive to light.   Neck: Normal range of motion. Neck supple.  Cardiovascular: Normal rate, regular rhythm and normal heart sounds. Exam reveals no gallop and no friction rub.  No murmur heard. Pulmonary/Chest: Effort normal and breath sounds normal. No respiratory distress. She has no wheezes.  Abdominal: Soft. Bowel sounds are normal. She exhibits no distension. There is no tenderness. There is no rebound and no guarding.  Neurological: She is alert and oriented to person, place, and time. She exhibits normal muscle tone. Coordination normal.  Skin: Skin is warm and dry. Capillary refill takes less than 2 seconds. No rash noted. No erythema.  Psychiatric: She has a normal mood and affect. Her behavior is normal.  Nursing note and vitals reviewed.    ED Treatments / Results  Labs (all labs ordered are listed, but only abnormal results are displayed) Labs Reviewed  COMPREHENSIVE METABOLIC PANEL - Abnormal; Notable for the following components:      Result Value   Sodium 134 (*)    Chloride 95 (*)    Creatinine, Ser 6.03 (*)    Calcium 8.6 (*)    Total Protein 9.5 (*)    Albumin 3.1 (*)    ALT 12 (*)    GFR calc non Af Amer 6 (*)    GFR calc Af Amer 7 (*)    All other components within normal limits  CBC - Abnormal; Notable for the following components:   RBC 2.61 (*)    Hemoglobin 8.7 (*)    HCT 28.7 (*)    MCV 110.0 (*)    RDW 17.9 (*)    Platelets 113 (*)    All other components within normal limits    EKG None  Radiology No results found.  Procedures Procedures (including critical care time)  Medications Ordered in ED Medications - No data to display   Initial Impression / Assessment and Plan / ED Course  I have reviewed the triage vital signs and the nursing notes.  Pertinent labs & imaging results that were available during my care of the patient were reviewed by me and considered in my medical decision making (see chart for details).     The patient does not have any CT  scan findings that are concerning for acute injury.  She does have a concussive injury.  I did advise her she will need to see her primary doctor for further evaluation.  Patient  states that this is her only complaint even though she did mention that she had some blood when she wiped after a bowel movement this morning.  Final Clinical Impressions(s) / ED Diagnoses   Final diagnoses:  Fall, initial encounter  Concussion without loss of consciousness, subsequent encounter    ED Discharge Orders    None       Dalia Heading, PA-C 02/26/18 0104    Daleen Bo, MD 02/27/18 [phone]

## 2018-03-21 ENCOUNTER — Other Ambulatory Visit: Payer: Self-pay

## 2018-03-21 MED ORDER — LORAZEPAM 0.5 MG PO TABS: 0.5000 mg | ORAL_TABLET | Freq: Every day | ORAL | 0 refills | Status: DC

## 2018-03-21 NOTE — Telephone Encounter (Signed)
Rx was written by Durenda Age, NP and given to ADON.

## 2018-03-24 ENCOUNTER — Encounter: Payer: Self-pay | Admitting: Adult Health

## 2018-03-24 ENCOUNTER — Non-Acute Institutional Stay (SKILLED_NURSING_FACILITY): Payer: Medicare Other | Admitting: Adult Health

## 2018-03-24 DIAGNOSIS — F339 Major depressive disorder, recurrent, unspecified: Secondary | ICD-10-CM | POA: Diagnosis not present

## 2018-03-24 DIAGNOSIS — Z992 Dependence on renal dialysis: Secondary | ICD-10-CM

## 2018-03-24 DIAGNOSIS — D630 Anemia in neoplastic disease: Secondary | ICD-10-CM | POA: Diagnosis not present

## 2018-03-24 DIAGNOSIS — C9000 Multiple myeloma not having achieved remission: Secondary | ICD-10-CM | POA: Diagnosis not present

## 2018-03-24 DIAGNOSIS — F39 Unspecified mood [affective] disorder: Secondary | ICD-10-CM

## 2018-03-24 DIAGNOSIS — F419 Anxiety disorder, unspecified: Secondary | ICD-10-CM

## 2018-03-24 DIAGNOSIS — N186 End stage renal disease: Secondary | ICD-10-CM | POA: Diagnosis not present

## 2018-03-24 DIAGNOSIS — I120 Hypertensive chronic kidney disease with stage 5 chronic kidney disease or end stage renal disease: Secondary | ICD-10-CM

## 2018-03-24 NOTE — Progress Notes (Signed)
Location:  [place] Room Number: 109-S Place of Service:  SNF (31) Provider:  Durenda Age, NP  Patient Care Team: Hendricks Limes, MD as PCP - General (Internal Medicine) Medina-Vargas, Senaida Lange, NP as Nurse Practitioner (Internal Medicine)  Extended Emergency Contact Information Primary Emergency Contact: Wilburn Mylar [place] Phone: [phone] Mobile Phone: [phone] Relation: Daughter Secondary Emergency Contact: Odie Sera [place] of [place] Mobile Phone: [phone] Relation: Brother  Code Status:  Full Code  Goals of care: Advanced Directive information Advanced Directives 06/26/2017  Does Patient Have a Medical Advance Directive? No  Type of Advance Directive -  Does patient want to make changes to medical advance directive? -  Copy of [place] in Chart? -  Would patient like information on creating a medical advance directive? -     Chief Complaint  Patient presents with  . Medical Management of Chronic Issues    The patient is seen for a routine [facility] visit    HPI:  [name] is a 77 y.o. female seen today for medical management of chronic diseases.  She is a long-term care resident of [facility].  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 She was seen in her room today. She was just finishing her breakfast. She was treated with Doxycycline last month for bronchitis. She denies having cough. Noted with trace edema on BLE. She said,"I will go to dialysis tomorrow and will take care of that."  BPs are stable - 149/84, 110/69, 132/80, 122/80.    Past Medical History:  Diagnosis Date  . Anxiety   . Closed fracture of right distal femur ([place]) 09/01/2015  . Depression   . ESRD (end stage renal disease) on dialysis [facility])    "TTS; [place] [facility]" (04/24/2017)  . GERD  (gastroesophageal reflux disease) 04/23/2015  . HCAP (healthcare-associated pneumonia) 04/24/2017  . Heart murmur   . History of blood transfusion    "low HgB when I was going to dialysis center"  . Hypertension   . Hypertension associatd with end stage renal disease on dialysis 03/11/2014  . Malnutrition of moderate degree 09/02/2015  . Multiple myeloma ([place]) 03/11/2014  . Patient is Jehovah's Witness    "I'd rather never have any blood transfusions unless absolutely necessary; please check with me 1st". (04/24/2017)  . Pneumonia ~ 2016   Past Surgical History:  Procedure Laterality Date  . AV FISTULA PLACEMENT Left   . I&D EXTREMITY Left 02/27/2016   Procedure: ARTHROSCOPIC IRRIGATION AND DEBRIDEMENT EXTREMITY;  Surgeon: Renette Butters, MD;  Location: [place];  Service: Orthopedics;  Laterality: Left;  . TEE WITHOUT CARDIOVERSION N/A 02/29/2016   Procedure: TRANSESOPHAGEAL ECHOCARDIOGRAM (TEE);  Surgeon: Thayer Headings, MD;  Location: [facility] ENDOSCOPY;  Service: Cardiovascular;  Laterality: N/A;    No Known Allergies  Outpatient Encounter Medications as of 03/24/2018  Medication Sig  . amLODipine (NORVASC) 10 MG tablet Take 10 mg by mouth See admin instructions. HOLD UNTIL AFTER DIALYSIS ON TUES/THURS/SAT AND HOLD FOR SYSTOLIC B/P READING OF 160 OR LESS  . bisacodyl (BISAC-EVAC) 10 MG suppository Place 10 mg rectally once as needed (FOR CONSTIPATION NOT RELIEVED BY MILK OF MAGNESIA).   . calcium acetate (PHOSLO) 667 MG capsule Take 2,001 mg by mouth 3 (three) times daily.   . cloNIDine (CATAPRES) 0.1 MG tablet Take 0.1 mg by mouth every 8 (eight) hours as needed (  for a systolic B/P of 616 or greater).   . hydrocortisone cream 1 % Apply 1 application topically every 8 (eight) hours as needed for itching. Apply to rectum for hemorrhoids  . isosorbide mononitrate (IMDUR) 60 MG 24 hr tablet Take 60 mg by mouth daily.   Marland Kitchen lamoTRIgine (LAMICTAL) 25 MG tablet Take 50 mg by mouth daily. Take 2 tablets  to = 50 mg qd to stabilize seizures  . latanoprost (XALATAN) 0.005 % ophthalmic solution Place 1 drop into both eyes at bedtime.   Marland Kitchen LORazepam (ATIVAN) 0.5 MG tablet Take 1 tablet (0.5 mg total) by mouth at bedtime.  Marland Kitchen losartan (COZAAR) 50 MG tablet Take 50 mg by mouth at bedtime.  . magnesium hydroxide (MILK OF MAGNESIA) 400 MG/5ML suspension Take 15-30 mLs by mouth once as needed for mild constipation.  . multivitamin (RENA-VIT) TABS tablet Take 1 tablet by mouth daily.  . ondansetron (ZOFRAN ODT) 4 MG disintegrating tablet Take 1 tablet (4 mg total) by mouth every 8 (eight) hours as needed for nausea or vomiting.  . promethazine (PHENERGAN) 25 MG tablet Take 25 mg by mouth every 6 (six) hours as needed for nausea or vomiting.  . ranitidine (ZANTAC) 300 MG tablet Take 300 mg by mouth at bedtime. Reported on 03/05/2016  . sennosides-docusate sodium (SENOKOT-S) 8.6-50 MG tablet Take 2 tablets by mouth at bedtime.   . sertraline (ZOLOFT) 50 MG tablet Take 50 mg by mouth daily.   . sevelamer carbonate (RENVELA) 800 MG tablet Take 800 mg by mouth 3 (three) times daily with meals.   . simethicone (MYLICON) 073 MG chewable tablet Chew 125 mg by mouth 3 (three) times daily. Take 1 tablet by mouth prior to each meal at 8AM, 11AM, and 6PM  . Sodium Phosphates (RA SALINE ENEMA) 19-7 GM/118ML ENEM Place 1 enema rectally once as needed (if constipation not relieved by Dulcolax suppository). CALL DOCTOR IF NO RELIEF FROM THIS  . valACYclovir (VALTREX) 500 MG tablet Take 1 tablet (500 mg total) by mouth every other day.   No facility-administered encounter medications on file as of 03/24/2018.     Review of Systems  GENERAL: No change in appetite, no fatigue, no weight changes, no fever, chills or weakness MOUTH and THROAT: Denies oral discomfort, gingival pain or bleeding RESPIRATORY: no cough, SOB, DOE, wheezing, hemoptysis CARDIAC: No chest pain, or palpitations GI: No abdominal pain, diarrhea,  constipation, heart burn, nausea or vomiting PSYCHIATRIC: Denies feelings of depression or anxiety. No report of hallucinations, insomnia, paranoia, or agitation   Immunization History  Administered Date(s) Administered  . Influenza,inj,Quad PF,6+ Mos 07/23/2014, 08/01/2016  . Influenza,inj,quad, With Preservative 08/07/2017  . PPD Test 04/13/2015   Pertinent  Health Maintenance Due  Topic Date Due  . INFLUENZA VACCINE  07/31/2018 (Originally 05/01/2018)  . PNA vac Low Risk Adult (1 of 2 - PCV13) 08/29/2018 (Originally 03/20/2004)  . DEXA SCAN  11/28/2018 (Originally 03/20/2004)   Fall Risk  06/06/2017 11/09/2016 06/15/2016 05/25/2016 05/23/2016  Falls in the past year? No No No No No  Number falls in past yr: - - - - -  Injury with Fall? - - - - -  Risk Factor Category  - - - - -  Risk for fall due to : - - - - -  Risk for fall due to: Comment - - - - -  Follow up - - - - -      Vitals:   03/24/18 0857  BP: 110/69  Pulse: 78  Resp: 20  Temp: 98.7 F (37.1 C)  TempSrc: Oral  SpO2: 95%  Weight: 141 lb 14.4 oz (64.4 kg)  Height: '5\' 6"'  (1.676 m)   Body mass index is 22.9 kg/m.  Physical Exam  GENERAL APPEARANCE: Well nourished. In no acute distress. Normal body habitus SKIN:  Skin is warm and dry.  MOUTH and THROAT: Lips are without lesions. Oral mucosa is moist and without lesions. Tongue is normal in shape, size, and color and without lesions RESPIRATORY: Breathing is even & unlabored, BS CTAB CARDIAC: RRR, + murmur,no extra heart sounds, trace BLE edema, left upper arm AV fistula + bruit and thrill GI: Abdomen soft, normal BS, no masses, no tenderness EXTREMITIES:  Able to move X 4 extremities PSYCHIATRIC: Alert and oriented X 3. Affect and behavior are appropriate   Labs reviewed: Recent Labs    04/26/17 0417 06/26/17 2324 06/29/17 10/03/17 02/20/18 2030  NA 136 131* 137 136* 134*  K 4.3 6.2* 5.4* 5.0 4.0  CL 100* 94*  --   --  95*  CO2 28 25  --   --  30    GLUCOSE 73 84  --   --  96  BUN 22* 73* 55* 24* 18  CREATININE 4.63* 10.29* 9.0* 5.2* 6.03*  CALCIUM 8.2* 9.2  --   --  8.6*   Recent Labs    04/24/17 1158 06/26/17 2324 02/20/18 2030  AST 96* 16 31  ALT 26 8* 12*  ALKPHOS 112 86 46  BILITOT 0.9 0.5 0.7  PROT 7.6 7.9 9.5*  ALBUMIN 3.2* 3.4* 3.1*   Recent Labs    04/26/17 0417 05/21/17 06/20/17 06/26/17 1824 02/20/18 2030  WBC 10.2 3.6 10.1 4.1 9.2  NEUTROABS  --  2 9 2.5  --   HGB 9.3* 8.3* 9.3* 9.9* 8.7*  HCT 28.8* 27* 30* 31.3* 28.7*  MCV 99.3  --   --  105.7* 110.0*  PLT 60* 161 179 165 113*   Lab Results  Component Value Date   TSH 1.457 03/13/2016   Lab Results  Component Value Date   HGBA1C 4.9 03/13/2016   Lab Results  Component Value Date   CHOL 202 (A) 11/27/2017   HDL 77 (A) 11/27/2017   LDLCALC 112 11/27/2017   TRIG 65 11/27/2017    Assessment/Plan  1. Anemia in neoplastic disease -  stable Lab Results  Component Value Date   HGB 8.7 (L) 02/20/2018     2. Hypertension associatd with end stage renal disease on dialysis - stable, continue Clonidine 0.1 mg Q 8 hours., Amlodipine 10 mg daily, and Losartan 50 mg Q HS   3. Multiple myeloma, remission status unspecified ([place]) - currently having chemotherapy, continue Valacyclovir 500 mg Q other day   4. Anxiety - mood is stable, continue Lorazepam 0.5 mg Q HS   5. Depression, recurrent ([place]) - happy and smiling today, continue Sertraline 50 mg daily  6. Mood disorder - mood is stable, continue Lamotrigine 25 mg give 2 tabs = 50 mg daily   7.  ESRD - continue Sevelamer 800 mg TID, hemodialysis 3X/week     Family/ staff Communication: Discussed plan of care with resident.  Labs/tests ordered:  None  Goals of care:   Long-term care.   Durenda Age, NP [facility] [phone] (Monday-Friday 8:00 a.m. - 5:00 p.m.) [phone] (after hours)

## 2018-03-27 ENCOUNTER — Emergency Department (HOSPITAL_COMMUNITY)
Admission: EM | Admit: 2018-03-27 | Discharge: 2018-03-28 | Disposition: A | Discharge status: Home / Self Care | Payer: Medicare Other | Attending: Emergency Medicine | Admitting: Emergency Medicine

## 2018-03-27 ENCOUNTER — Other Ambulatory Visit: Payer: Self-pay

## 2018-03-27 ENCOUNTER — Emergency Department (HOSPITAL_COMMUNITY): Payer: Medicare Other

## 2018-03-27 DIAGNOSIS — Z79899 Other long term (current) drug therapy: Secondary | ICD-10-CM | POA: Insufficient documentation

## 2018-03-27 DIAGNOSIS — I120 Hypertensive chronic kidney disease with stage 5 chronic kidney disease or end stage renal disease: Secondary | ICD-10-CM | POA: Insufficient documentation

## 2018-03-27 DIAGNOSIS — R110 Nausea: Secondary | ICD-10-CM | POA: Insufficient documentation

## 2018-03-27 DIAGNOSIS — N186 End stage renal disease: Secondary | ICD-10-CM | POA: Diagnosis not present

## 2018-03-27 LAB — COMPREHENSIVE METABOLIC PANEL
ALK PHOS: 34 U/L — AB (ref 38–126)
ALT: 16 U/L (ref 0–44)
AST: 30 U/L (ref 15–41)
Albumin: 2.8 g/dL — ABNORMAL LOW (ref 3.5–5.0)
Anion gap: 13 (ref 5–15)
BILIRUBIN TOTAL: 0.4 mg/dL (ref 0.3–1.2)
BUN: 62 mg/dL — ABNORMAL HIGH (ref 8–23)
CO2: 25 mmol/L (ref 22–32)
CREATININE: 9.25 mg/dL — AB (ref 0.44–1.00)
Calcium: 9.4 mg/dL (ref 8.9–10.3)
Chloride: 92 mmol/L — ABNORMAL LOW (ref 98–111)
GFR calc non Af Amer: 4 mL/min — ABNORMAL LOW (ref >60)
GFR, EST AFRICAN AMERICAN: 4 mL/min — AB (ref >60)
Glucose, Bld: 83 mg/dL (ref 70–99)
Potassium: 3.8 mmol/L (ref 3.5–5.1)
SODIUM: 130 mmol/L — AB (ref 135–145)
Total Protein: 8.3 g/dL — ABNORMAL HIGH (ref 6.5–8.1)

## 2018-03-27 MED ORDER — SODIUM CHLORIDE 0.9 % IV BOLUS: 500.0000 mL | Freq: Once | INTRAVENOUS | Status: DC

## 2018-03-27 MED ORDER — SODIUM CHLORIDE 0.9 % IV SOLN: INTRAVENOUS | Status: DC

## 2018-03-27 NOTE — ED Notes (Signed)
Bladder scan: 98mL

## 2018-03-27 NOTE — ED Notes (Signed)
Patient transported to X-ray 

## 2018-03-27 NOTE — ED Notes (Signed)
Updated family member via pt's phone at pt request

## 2018-03-27 NOTE — ED Notes (Signed)
Delay in lab draw,  Pt not in room 

## 2018-03-27 NOTE — ED Provider Notes (Signed)
[place] EMERGENCY DEPARTMENT Provider Note   CSN: [id number] Arrival date & time: 03/27/18  1928     History   Chief Complaint Chief Complaint  Patient presents with  . Nausea    HPI [name] is a 77 y.o. female.  HPI   Patient is here for evaluation of lethargy and decreased appetite, for 2 weeks.  He states that her chemotherapeutic agent, for multiple myeloma infusions, was changed on March 12, 2018.  Her last infusion was yesterday.  She denies fever, chills, vomiting, diarrhea, abdominal pain, back pain, focal weakness or paresthesia.  She reports a nonproductive cough for several days.  She denies chest pain.  There are no other known modifying factors.  Past Medical History:  Diagnosis Date  . Anxiety   . Closed fracture of right distal femur ([place]) 09/01/2015  . Depression   . ESRD (end stage renal disease) on dialysis [facility])    "TTS; [place]" (04/24/2017)  . GERD (gastroesophageal reflux disease) 04/23/2015  . HCAP (healthcare-associated pneumonia) 04/24/2017  . Heart murmur   . History of blood transfusion    "low HgB when I was going to dialysis center"  . Hypertension   . Hypertension associatd with end stage renal disease on dialysis 03/11/2014  . Malnutrition of moderate degree 09/02/2015  . Multiple myeloma ([place]) 03/11/2014  . Patient is Jehovah's Witness    "I'd rather never have any blood transfusions unless absolutely necessary; please check with me 1st". (04/24/2017)  . Pneumonia ~ 2016    Patient Active Problem List   Diagnosis Date Noted  . Abnormal chest x-ray 12/26/2017  . HCAP (healthcare-associated pneumonia) 04/24/2017  . Sepsis ([place]) 04/24/2017  . Nausea and vomiting 04/24/2017  . Thrombocytopenia ([place]) 04/24/2017  . Hyperkalemia 01/16/2017  . IBS (irritable bowel syndrome) 12/27/2016  . Mood disorder ([place]) 07/19/2016  . Diastolic dysfunction 82/95/6213  . Insomnia 04/27/2016  . Hyponatremia 03/12/2016  .  Symptomatic anemia 03/12/2016  . Diarrhea 03/12/2016  . Essential hypertension   . Swelling of joint of left knee 02/18/2016  . Leukopenia due to antineoplastic chemotherapy ([place]) 09/03/2015  . Malnutrition of moderate degree 09/02/2015  . GERD (gastroesophageal reflux disease) 04/23/2015  . 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 Multiple myeloma ([place]) 03/11/2014  . Hypertension associatd with end stage renal disease on dialysis 03/11/2014    Past Surgical History:  Procedure Laterality Date  . AV FISTULA PLACEMENT Left   . I&D EXTREMITY Left 02/27/2016   Procedure: ARTHROSCOPIC IRRIGATION AND DEBRIDEMENT EXTREMITY;  Surgeon: Renette Butters, MD;  Location: [place];  Service: Orthopedics;  Laterality: Left;  . TEE WITHOUT CARDIOVERSION N/A 02/29/2016   Procedure: TRANSESOPHAGEAL ECHOCARDIOGRAM (TEE);  Surgeon: Thayer Headings, MD;  Location: [place];  Service: Cardiovascular;  Laterality: N/A;     OB History   None      Home Medications    Prior to Admission medications   Medication Sig Start Date End Date Taking? Authorizing Provider  amLODipine (NORVASC) 10 MG tablet Take 10 mg by mouth daily. Hold for SBP <100MM/HG   Yes [provider]  bisacodyl (BISAC-EVAC) 10 MG suppository Place 10 mg rectally once as needed (FOR CONSTIPATION NOT RELIEVED BY MILK OF MAGNESIA).    Yes [provider]  calcium acetate (PHOSLO) 667 MG capsule Take 2,001 mg by mouth 3 (three) times daily.    Yes [provider]  cloNIDine (CATAPRES) 0.1 MG tablet Take 0.1 mg by mouth every 8 (  eight) hours as needed (for a systolic B/P of 720 or greater).    Yes [provider]  hydrocortisone cream 1 % Apply 1 application topically every 8 (eight) hours as needed for itching. Apply to rectum for hemorrhoids   Yes [provider]  isosorbide mononitrate (IMDUR) 60 MG 24 hr tablet Take 60 mg by mouth daily.  03/24/15  Yes [provider]  lamoTRIgine  (LAMICTAL) 25 MG tablet Take 50 mg by mouth daily.    Yes [provider]  latanoprost (XALATAN) 0.005 % ophthalmic solution Place 1 drop into both eyes at bedtime.    Yes [provider]  loperamide (IMODIUM A-D) 2 MG tablet Take 4 mg by mouth as needed for diarrhea or loose stools. (pre-dialysis days, per resident request)   Yes [provider]  LORazepam (ATIVAN) 0.5 MG tablet Take 1 tablet (0.5 mg total) by mouth at bedtime. 03/21/18  Yes Medina-Vargas, Monina C, NP  losartan (COZAAR) 50 MG tablet Take 50 mg by mouth at bedtime.   Yes [provider]  multivitamin (RENA-VIT) TABS tablet Take 1 tablet by mouth daily.   Yes [provider]  ondansetron (ZOFRAN) 4 MG tablet Take 4 mg by mouth every 8 (eight) hours as needed for nausea or vomiting.   Yes [provider]  promethazine (PHENERGAN) 25 MG tablet Take 25 mg by mouth every 6 (six) hours as needed for nausea or vomiting.   Yes [provider]  ranitidine (ZANTAC) 300 MG tablet Take 300 mg by mouth at bedtime. Reported on 03/05/2016   Yes [provider]  senna-docusate (SENEXON-S) 8.6-50 MG tablet Take 2 tablets by mouth at bedtime.   Yes [provider]  sertraline (ZOLOFT) 50 MG tablet Take 50 mg by mouth daily.    Yes [provider]  sevelamer carbonate (RENVELA) 800 MG tablet Take 800 mg by mouth 3 (three) times daily with meals.    Yes [provider]  simethicone (MYLICON) 947 MG chewable tablet Chew 125 mg by mouth 3 (three) times daily. Take 1 tablet by mouth prior to each meal at 8AM, 11AM, and 6PM   Yes [provider]  valACYclovir (VALTREX) 500 MG tablet Take 1 tablet (500 mg total) by mouth every other day. Patient taking differently: Take 500 mg by mouth every other day. (On even days) 03/01/16  Yes Hongalgi, Lenis Dickinson, MD  ondansetron (ZOFRAN ODT) 4 MG disintegrating tablet Take 1 tablet (4 mg total) by mouth every 8 (eight)  hours as needed for nausea or vomiting. Patient not taking: Reported on 03/27/2018 01/21/15   Everlene Balls, MD    Family History Family History  Problem Relation Age of Onset  . Hypertension Mother   . Hypertension Father     Social History Social History   Tobacco Use  . Smoking status: Never Smoker  . Smokeless tobacco: Never Used  Substance Use Topics  . Alcohol use: No    Alcohol/week: 0.0 oz  . Drug use: No     Allergies   Patient has no known allergies.   Review of Systems Review of Systems  All other systems reviewed and are negative.    Physical Exam Updated Vital Signs BP 135/84   Pulse (!) 58   Temp 99 F (37.2 C) (Oral)   Resp 16   Ht '5\' 6"'  (1.676 m)   Wt 64 kg (141 lb)   SpO2 91%   BMI 22.76 kg/m   Physical Exam  Constitutional: She is oriented to person, place, and time. She appears well-developed. No distress.  Elderly, frail  HENT:  Head: Normocephalic and atraumatic.  Eyes: Pupils are equal, round, and reactive to light. Conjunctivae and EOM are normal.  Neck: Normal range of motion and phonation normal. Neck supple.  Cardiovascular: Normal rate and regular rhythm.  Pulmonary/Chest: Effort normal and breath sounds normal. No respiratory distress. She exhibits no tenderness.  No rhonchi or rales  Abdominal: Soft. She exhibits no distension. There is no tenderness. There is no guarding.  Musculoskeletal: Normal range of motion.  Neurological: She is alert and oriented to person, place, and time. She exhibits normal muscle tone.  Skin: Skin is warm and dry.  Psychiatric: She has a normal mood and affect. Her behavior is normal.  Nursing note and vitals reviewed.    ED Treatments / Results  Labs (all labs ordered are listed, but only abnormal results are displayed) Labs Reviewed  COMPREHENSIVE METABOLIC PANEL  CBC WITH DIFFERENTIAL/PLATELET  URINALYSIS, ROUTINE W REFLEX MICROSCOPIC    EKG None  Radiology No results  found.  Procedures Procedures (including critical care time)  Medications Ordered in ED Medications  sodium chloride 0.9 % bolus 500 mL (has no administration in time range)  0.9 %  sodium chloride infusion (has no administration in time range)     Initial Impression / Assessment and Plan / ED Course  I have reviewed the triage vital signs and the nursing notes.  Pertinent labs & imaging results that were available during my care of the patient were reviewed by me and considered in my medical decision making (see chart for details).  Clinical Course as of Mar 28 2135  Thu Mar 27, 2018  2135 Nontoxic patient with vague and subacute symptoms, change in chemotherapeutic agent.  Screening for metabolic or infectious processes undertaken.  Patient can likely go home following this evaluation.   [EW]    Clinical Course User Index [EW] Daleen Bo, MD     Patient Vitals for the past 24 hrs:  BP Temp Temp src Pulse Resp SpO2 Height Weight  03/27/18 2015 135/84 - - (!) 58 16 91 % - -  03/27/18 2000 138/82 - - (!) 58 16 90 % - -  03/27/18 1930 129/75 - - (!) 54 12 93 % - -  03/27/18 1928 (!) 141/81 99 F (37.2 C) Oral (!) 55 19 98 % '5\' 6"'  (1.676 m) 64 kg (141 lb)      Medical Decision Making: Nonspecific malaise. Possible relation to new chemo dose. Additional testing pending.  CRITICAL CARE- No Performed by: Daleen Bo  Nursing Notes Reviewed/ Care Coordinated Applicable Imaging Reviewed Interpretation of Laboratory Data incorporated into ED treatment  Disposition per oncoming team after return of testing.   Final Clinical Impressions(s) / ED Diagnoses   Final diagnoses:  Nausea    ED Discharge Orders    None       Daleen Bo, MD 03/28/18 [phone]

## 2018-03-27 NOTE — ED Triage Notes (Signed)
Pt arrived via [facility] from [facility] after pt reported to staff she was feeling nauseas. Per staff, pt had one episode of vomiting last this morning. Pt denies dizziness, denies pain or SOB at time of triage. Pt is alert and oriented at time of triage. VSS.

## 2018-03-27 NOTE — ED Notes (Signed)
Unable to obtain IV for fluids or blood draw. EDP and Phlebotomy notified.

## 2018-03-27 NOTE — ED Provider Notes (Signed)
Patient states she rarely if ever makes urine.  Will DC urine.  11:50 PM Patient states she is hungry and wants to go home.  Abdomen is soft and nontender.  No longer feeling nauseated.   Montine Circle, PA-C 03/27/18 2351    Daleen Bo, MD 03/28/18 [phone]

## 2018-03-28 LAB — CBC WITH DIFFERENTIAL/PLATELET
BASOS ABS: 0 10*3/uL (ref 0.0–0.1)
BASOS PCT: 0 %
EOS ABS: 0.1 10*3/uL (ref 0.0–0.7)
EOS PCT: 2 %
HEMATOCRIT: 27.4 % — AB (ref 36.0–46.0)
HEMOGLOBIN: 8.3 g/dL — AB (ref 12.0–15.0)
LYMPHS PCT: 8 %
Lymphs Abs: 0.4 10*3/uL — ABNORMAL LOW (ref 0.7–4.0)
MCH: 34.7 pg — ABNORMAL HIGH (ref 26.0–34.0)
MCHC: 30.3 g/dL (ref 30.0–36.0)
MCV: 114.6 fL — ABNORMAL HIGH (ref 78.0–100.0)
Monocytes Absolute: 0.3 10*3/uL (ref 0.1–1.0)
Monocytes Relative: 6 %
NEUTROS PCT: 84 %
Neutro Abs: 4.7 10*3/uL (ref 1.7–7.7)
Platelets: 72 10*3/uL — ABNORMAL LOW (ref 150–400)
RBC: 2.39 MIL/uL — AB (ref 3.87–5.11)
RDW: 15.9 % — ABNORMAL HIGH (ref 11.5–15.5)
WBC: 5.5 10*3/uL (ref 4.0–10.5)

## 2018-03-28 NOTE — ED Notes (Signed)
Attempted to call report to [facility]

## 2018-03-28 NOTE — ED Notes (Signed)
Secretary to [employer] for pt

## 2018-04-15 LAB — BASIC METABOLIC PANEL
BUN: 45 — AB (ref 4–21)
Creatinine: 9.1 — AB (ref 0.5–1.1)
GLUCOSE: 82
Potassium: 3.9 (ref 3.4–5.3)
Sodium: 137 (ref 137–147)

## 2018-04-16 ENCOUNTER — Emergency Department (HOSPITAL_COMMUNITY): Payer: Medicare Other

## 2018-04-16 ENCOUNTER — Inpatient Hospital Stay (HOSPITAL_COMMUNITY)
Admission: EM | Admit: 2018-04-16 | Discharge: 2018-04-18 | DRG: 091 | Disposition: A | Discharge status: Skilled Nursing Facility | Payer: Medicare Other | Source: Skilled Nursing Facility | Attending: Internal Medicine | Admitting: Internal Medicine

## 2018-04-16 ENCOUNTER — Inpatient Hospital Stay (HOSPITAL_COMMUNITY): Payer: Medicare Other

## 2018-04-16 DIAGNOSIS — D630 Anemia in neoplastic disease: Secondary | ICD-10-CM | POA: Diagnosis present

## 2018-04-16 DIAGNOSIS — I120 Hypertensive chronic kidney disease with stage 5 chronic kidney disease or end stage renal disease: Secondary | ICD-10-CM | POA: Diagnosis present

## 2018-04-16 DIAGNOSIS — R221 Localized swelling, mass and lump, neck: Secondary | ICD-10-CM

## 2018-04-16 DIAGNOSIS — N2581 Secondary hyperparathyroidism of renal origin: Secondary | ICD-10-CM | POA: Diagnosis present

## 2018-04-16 DIAGNOSIS — R410 Disorientation, unspecified: Secondary | ICD-10-CM

## 2018-04-16 DIAGNOSIS — K589 Irritable bowel syndrome without diarrhea: Secondary | ICD-10-CM | POA: Diagnosis present

## 2018-04-16 DIAGNOSIS — Z79899 Other long term (current) drug therapy: Secondary | ICD-10-CM | POA: Diagnosis not present

## 2018-04-16 DIAGNOSIS — F0390 Unspecified dementia without behavioral disturbance: Secondary | ICD-10-CM | POA: Diagnosis present

## 2018-04-16 DIAGNOSIS — G9340 Encephalopathy, unspecified: Secondary | ICD-10-CM | POA: Diagnosis not present

## 2018-04-16 DIAGNOSIS — K219 Gastro-esophageal reflux disease without esophagitis: Secondary | ICD-10-CM | POA: Diagnosis present

## 2018-04-16 DIAGNOSIS — Z9115 Patient's noncompliance with renal dialysis: Secondary | ICD-10-CM

## 2018-04-16 DIAGNOSIS — T451X5A Adverse effect of antineoplastic and immunosuppressive drugs, initial encounter: Secondary | ICD-10-CM | POA: Diagnosis present

## 2018-04-16 DIAGNOSIS — C9000 Multiple myeloma not having achieved remission: Secondary | ICD-10-CM | POA: Diagnosis present

## 2018-04-16 DIAGNOSIS — F329 Major depressive disorder, single episode, unspecified: Secondary | ICD-10-CM | POA: Diagnosis present

## 2018-04-16 DIAGNOSIS — R4701 Aphasia: Secondary | ICD-10-CM | POA: Diagnosis present

## 2018-04-16 DIAGNOSIS — R29713 NIHSS score 13: Secondary | ICD-10-CM | POA: Diagnosis present

## 2018-04-16 DIAGNOSIS — R29810 Facial weakness: Secondary | ICD-10-CM | POA: Diagnosis present

## 2018-04-16 DIAGNOSIS — Z531 Procedure and treatment not carried out because of patient's decision for reasons of belief and group pressure: Secondary | ICD-10-CM | POA: Diagnosis present

## 2018-04-16 DIAGNOSIS — G92 Toxic encephalopathy: Secondary | ICD-10-CM | POA: Diagnosis present

## 2018-04-16 DIAGNOSIS — N186 End stage renal disease: Secondary | ICD-10-CM

## 2018-04-16 DIAGNOSIS — L8991 Pressure ulcer of unspecified site, stage 1: Secondary | ICD-10-CM

## 2018-04-16 DIAGNOSIS — Z992 Dependence on renal dialysis: Secondary | ICD-10-CM | POA: Diagnosis not present

## 2018-04-16 DIAGNOSIS — D701 Agranulocytosis secondary to cancer chemotherapy: Secondary | ICD-10-CM | POA: Diagnosis present

## 2018-04-16 DIAGNOSIS — I10 Essential (primary) hypertension: Secondary | ICD-10-CM | POA: Insufficient documentation

## 2018-04-16 DIAGNOSIS — L89151 Pressure ulcer of sacral region, stage 1: Secondary | ICD-10-CM | POA: Diagnosis present

## 2018-04-16 DIAGNOSIS — Z993 Dependence on wheelchair: Secondary | ICD-10-CM

## 2018-04-16 DIAGNOSIS — D631 Anemia in chronic kidney disease: Secondary | ICD-10-CM | POA: Diagnosis present

## 2018-04-16 DIAGNOSIS — Z8249 Family history of ischemic heart disease and other diseases of the circulatory system: Secondary | ICD-10-CM

## 2018-04-16 DIAGNOSIS — F419 Anxiety disorder, unspecified: Secondary | ICD-10-CM | POA: Diagnosis present

## 2018-04-16 LAB — CBC
HEMATOCRIT: 34.8 % — AB (ref 36.0–46.0)
HEMATOCRIT: 32.3 % — AB (ref 36.0–46.0)
HEMOGLOBIN: 10.5 g/dL — AB (ref 12.0–15.0)
HEMOGLOBIN: 10 g/dL — AB (ref 12.0–15.0)
MCH: 34.1 pg — ABNORMAL HIGH (ref 26.0–34.0)
MCH: 34.5 pg — AB (ref 26.0–34.0)
MCHC: 30.2 g/dL (ref 30.0–36.0)
MCHC: 31 g/dL (ref 30.0–36.0)
MCV: 113 fL — ABNORMAL HIGH (ref 78.0–100.0)
MCV: 111.4 fL — ABNORMAL HIGH (ref 78.0–100.0)
Platelets: 88 10*3/uL — ABNORMAL LOW (ref 150–400)
Platelets: 103 10*3/uL — ABNORMAL LOW (ref 150–400)
RBC: 3.08 MIL/uL — AB (ref 3.87–5.11)
RBC: 2.9 MIL/uL — AB (ref 3.87–5.11)
RDW: 14.6 % (ref 11.5–15.5)
RDW: 14.8 % (ref 11.5–15.5)
WBC: 3.1 10*3/uL — AB (ref 4.0–10.5)
WBC: 2.6 10*3/uL — ABNORMAL LOW (ref 4.0–10.5)

## 2018-04-16 LAB — CREATININE, SERUM
CREATININE: 11.42 mg/dL — AB (ref 0.44–1.00)
GFR calc Af Amer: 3 mL/min — ABNORMAL LOW (ref >60)
GFR calc non Af Amer: 3 mL/min — ABNORMAL LOW (ref >60)

## 2018-04-16 LAB — URINALYSIS, ROUTINE W REFLEX MICROSCOPIC
Bilirubin Urine: NEGATIVE
GLUCOSE, UA: 50 mg/dL — AB
KETONES UR: NEGATIVE mg/dL
Nitrite: NEGATIVE
PROTEIN: 100 mg/dL — AB
Specific Gravity, Urine: 1.009 (ref 1.005–1.030)
pH: 9 — ABNORMAL HIGH (ref 5.0–8.0)

## 2018-04-16 LAB — COMPREHENSIVE METABOLIC PANEL
ALBUMIN: 3.8 g/dL (ref 3.5–5.0)
ALT: 12 U/L (ref 0–44)
AST: 22 U/L (ref 15–41)
Alkaline Phosphatase: 42 U/L (ref 38–126)
Anion gap: 15 (ref 5–15)
BILIRUBIN TOTAL: 0.8 mg/dL (ref 0.3–1.2)
BUN: 51 mg/dL — AB (ref 8–23)
CHLORIDE: 95 mmol/L — AB (ref 98–111)
CO2: 24 mmol/L (ref 22–32)
CREATININE: 10.96 mg/dL — AB (ref 0.44–1.00)
Calcium: 9.9 mg/dL (ref 8.9–10.3)
GFR calc Af Amer: 3 mL/min — ABNORMAL LOW (ref >60)
GFR, EST NON AFRICAN AMERICAN: 3 mL/min — AB (ref >60)
GLUCOSE: 96 mg/dL (ref 70–99)
Potassium: 3.9 mmol/L (ref 3.5–5.1)
Sodium: 134 mmol/L — ABNORMAL LOW (ref 135–145)
TOTAL PROTEIN: 9.5 g/dL — AB (ref 6.5–8.1)

## 2018-04-16 LAB — DIFFERENTIAL
BASOS ABS: 0 10*3/uL (ref 0.0–0.1)
BASOS PCT: 0 %
EOS PCT: 5 %
Eosinophils Absolute: 0.2 10*3/uL (ref 0.0–0.7)
LYMPHS PCT: 14 %
Lymphs Abs: 0.4 10*3/uL — ABNORMAL LOW (ref 0.7–4.0)
Monocytes Absolute: 0.7 10*3/uL (ref 0.1–1.0)
Monocytes Relative: 22 %
NEUTROS PCT: 59 %
Neutro Abs: 1.8 10*3/uL (ref 1.7–7.7)

## 2018-04-16 LAB — I-STAT CHEM 8, ED
BUN: 52 mg/dL — AB (ref 8–23)
CREATININE: 12.6 mg/dL — AB (ref 0.44–1.00)
Calcium, Ion: 1.12 mmol/L — ABNORMAL LOW (ref 1.15–1.40)
Chloride: 98 mmol/L (ref 98–111)
GLUCOSE: 101 mg/dL — AB (ref 70–99)
HEMATOCRIT: 35 % — AB (ref 36.0–46.0)
HEMOGLOBIN: 11.9 g/dL — AB (ref 12.0–15.0)
POTASSIUM: 3.8 mmol/L (ref 3.5–5.1)
Sodium: 136 mmol/L (ref 135–145)
TCO2: 27 mmol/L (ref 22–32)

## 2018-04-16 LAB — TROPONIN I: Troponin I: 0.03 ng/mL (ref <0.03)

## 2018-04-16 LAB — APTT: APTT: 29 s (ref 24–36)

## 2018-04-16 LAB — TSH: TSH: 2.306 u[IU]/mL (ref 0.350–4.500)

## 2018-04-16 LAB — PROTIME-INR
INR: 1.09
Prothrombin Time: 14 seconds (ref 11.4–15.2)

## 2018-04-16 LAB — I-STAT TROPONIN, ED: TROPONIN I, POC: 0.05 ng/mL (ref 0.00–0.08)

## 2018-04-16 MED ORDER — RENA-VITE PO TABS
1.0000 | ORAL_TABLET | Freq: Every day | ORAL | Status: DC
  Administered 2018-04-16 – 2018-04-17 (×2): 1 via ORAL
  Filled 2018-04-16 (×2): qty 1

## 2018-04-16 MED ORDER — ONDANSETRON HCL 4 MG/2ML IJ SOLN: 4.0000 mg | Freq: Four times a day (QID) | INTRAMUSCULAR | Status: DC | PRN

## 2018-04-16 MED ORDER — SODIUM CHLORIDE 0.9 % IV SOLN: 250.0000 mL | INTRAVENOUS | Status: DC | PRN

## 2018-04-16 MED ORDER — IOPAMIDOL (ISOVUE-370) INJECTION 76%: 100.0000 mL | Freq: Once | INTRAVENOUS | Status: DC | PRN

## 2018-04-16 MED ORDER — SERTRALINE HCL 50 MG PO TABS
50.0000 mg | ORAL_TABLET | Freq: Every day | ORAL | Status: DC
  Administered 2018-04-16 – 2018-04-18 (×2): 50 mg via ORAL
  Filled 2018-04-16 (×2): qty 1

## 2018-04-16 MED ORDER — ACETAMINOPHEN 325 MG PO TABS: 650.0000 mg | ORAL_TABLET | Freq: Four times a day (QID) | ORAL | Status: DC | PRN

## 2018-04-16 MED ORDER — LOPERAMIDE HCL 2 MG PO CAPS: 4.0000 mg | ORAL_CAPSULE | ORAL | Status: DC | PRN

## 2018-04-16 MED ORDER — LOSARTAN POTASSIUM 50 MG PO TABS
50.0000 mg | ORAL_TABLET | Freq: Every day | ORAL | Status: DC
  Administered 2018-04-16 – 2018-04-17 (×2): 50 mg via ORAL
  Filled 2018-04-16 (×2): qty 1

## 2018-04-16 MED ORDER — ISOSORBIDE MONONITRATE ER 60 MG PO TB24
60.0000 mg | ORAL_TABLET | Freq: Every day | ORAL | Status: DC
  Administered 2018-04-16 – 2018-04-18 (×2): 60 mg via ORAL
  Filled 2018-04-16: qty 1
  Filled 2018-04-16: qty 2

## 2018-04-16 MED ORDER — ACETAMINOPHEN 650 MG RE SUPP: 650.0000 mg | Freq: Four times a day (QID) | RECTAL | Status: DC | PRN

## 2018-04-16 MED ORDER — FAMOTIDINE 20 MG PO TABS
20.0000 mg | ORAL_TABLET | Freq: Every day | ORAL | Status: DC
  Administered 2018-04-16 – 2018-04-18 (×2): 20 mg via ORAL
  Filled 2018-04-16 (×2): qty 1

## 2018-04-16 MED ORDER — VALACYCLOVIR HCL 500 MG PO TABS
500.0000 mg | ORAL_TABLET | ORAL | Status: DC
  Filled 2018-04-16: qty 1

## 2018-04-16 MED ORDER — FAMOTIDINE 20 MG PO TABS: 40.0000 mg | ORAL_TABLET | Freq: Every day | ORAL | Status: DC

## 2018-04-16 MED ORDER — SODIUM CHLORIDE 0.9% FLUSH: 3.0000 mL | INTRAVENOUS | Status: DC | PRN

## 2018-04-16 MED ORDER — SODIUM CHLORIDE 0.9% FLUSH
3.0000 mL | Freq: Two times a day (BID) | INTRAVENOUS | Status: DC
  Administered 2018-04-16 – 2018-04-18 (×3): 3 mL via INTRAVENOUS

## 2018-04-16 MED ORDER — HEPARIN SODIUM (PORCINE) 5000 UNIT/ML IJ SOLN
5000.0000 [IU] | Freq: Three times a day (TID) | INTRAMUSCULAR | Status: DC
  Administered 2018-04-16 – 2018-04-18 (×4): 5000 [IU] via SUBCUTANEOUS
  Filled 2018-04-16 (×5): qty 1

## 2018-04-16 MED ORDER — HYDRALAZINE HCL 20 MG/ML IJ SOLN
5.0000 mg | INTRAMUSCULAR | Status: DC | PRN
  Administered 2018-04-16 – 2018-04-18 (×3): 5 mg via INTRAVENOUS
  Filled 2018-04-16 (×3): qty 1

## 2018-04-16 MED ORDER — IOPAMIDOL (ISOVUE-370) INJECTION 76%
100.0000 mL | Freq: Once | INTRAVENOUS | Status: AC | PRN
  Administered 2018-04-16: 95 mL via INTRAVENOUS

## 2018-04-16 MED ORDER — CALCIUM ACETATE (PHOS BINDER) 667 MG PO CAPS
2001.0000 mg | ORAL_CAPSULE | Freq: Three times a day (TID) | ORAL | Status: DC
  Administered 2018-04-18 (×3): 2001 mg via ORAL
  Filled 2018-04-16 (×5): qty 3

## 2018-04-16 MED ORDER — SEVELAMER CARBONATE 800 MG PO TABS
800.0000 mg | ORAL_TABLET | Freq: Three times a day (TID) | ORAL | Status: DC
  Administered 2018-04-16 – 2018-04-18 (×4): 800 mg via ORAL
  Filled 2018-04-16 (×5): qty 1

## 2018-04-16 MED ORDER — LATANOPROST 0.005 % OP SOLN
1.0000 [drp] | Freq: Every day | OPHTHALMIC | Status: DC
  Administered 2018-04-16 – 2018-04-17 (×2): 1 [drp] via OPHTHALMIC
  Filled 2018-04-16: qty 2.5

## 2018-04-16 MED ORDER — ONDANSETRON HCL 4 MG PO TABS: 4.0000 mg | ORAL_TABLET | Freq: Four times a day (QID) | ORAL | Status: DC | PRN

## 2018-04-16 NOTE — ED Triage Notes (Signed)
Pt here via [facility] from [place] after being found dysphagic and weak.  Dialysis yesterday with no difficulty.  Pt able to follow commands.  cbg wnl, bp 196/133 with nsr.

## 2018-04-16 NOTE — ED Notes (Signed)
Pt is in MRI at this time.

## 2018-04-16 NOTE — Progress Notes (Signed)
New Admission Note:  Arrival Method: on stretcher from ER Mental Orientation: oriented to self Telemetry:3w-11 Assessment: Completed Skin: stage II on buttocks IV: R AC saline/locked Pain:0/10 Safety Measures: Safety Fall Prevention Plan was given, discussed. 1H46: Patient has been orientated to the room, unit and the staff.  Orders have been reviewed and implemented. Will continue to monitor the patient. Call light has been placed within reach and bed alarm has been activated.   Arta Silence ,RN

## 2018-04-16 NOTE — ED Provider Notes (Signed)
[facility] EMERGENCY DEPARTMENT Provider Note   CSN: [id number] Arrival date & time: 04/16/18  3361   An emergency department physician performed an initial assessment on this suspected stroke patient at 0926.  History   Chief Complaint Chief Complaint  Patient presents with  . Code Stroke    HPI [name] is a 77 y.o. female.  Level V caveat AMS.  The patient arrived as a code stroke.  She was acting somewhat differently last night and then this morning was noted to have slurred speech and difficulty moving her arms and legs.  Her emesis is drastically different from her baseline.  She was mumbling incoherently on my initial exam.  Would follow commands but was slow to respond.  The history is provided by the patient.  Illness  This is a new problem. The current episode started yesterday. The problem occurs constantly. The problem has been rapidly worsening. Pertinent negatives include no chest pain, no headaches and no shortness of breath. Nothing aggravates the symptoms. Nothing relieves the symptoms. She has tried nothing for the symptoms. The treatment provided no relief.    Past Medical History:  Diagnosis Date  . Anxiety   . Closed fracture of right distal femur ([place]) 09/01/2015  . Depression   . ESRD (end stage renal disease) on dialysis [facility])    "TTS; [place] [facility]" (04/24/2017)  . GERD (gastroesophageal reflux disease) 04/23/2015  . HCAP (healthcare-associated pneumonia) 04/24/2017  . Heart murmur   . History of blood transfusion    "low HgB when I was going to dialysis center"  . Hypertension   . Hypertension associatd with end stage renal disease on dialysis 03/11/2014  . Malnutrition of moderate degree 09/02/2015  . Multiple myeloma ([place]) 03/11/2014  . Patient is Jehovah's Witness    "I'd rather never have any blood transfusions unless absolutely necessary; please check with me 1st". (04/24/2017)  . Pneumonia ~ 2016    Patient Active Problem  List   Diagnosis Date Noted  . Abnormal chest x-ray 12/26/2017  . HCAP (healthcare-associated pneumonia) 04/24/2017  . Sepsis ([place]) 04/24/2017  . Nausea and vomiting 04/24/2017  . Thrombocytopenia ([place]) 04/24/2017  . Hyperkalemia 01/16/2017  . IBS (irritable bowel syndrome) 12/27/2016  . Mood disorder ([place]) 07/19/2016  . Diastolic dysfunction 22/44/9753  . Insomnia 04/27/2016  . Hyponatremia 03/12/2016  . Symptomatic anemia 03/12/2016  . Diarrhea 03/12/2016  . Essential hypertension   . Swelling of joint of left knee 02/18/2016  . Leukopenia due to antineoplastic chemotherapy ([place]) 09/03/2015  . Malnutrition of moderate degree 09/02/2015  . GERD (gastroesophageal reflux disease) 04/23/2015  . 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 Multiple myeloma ([place]) 03/11/2014  . Hypertension associatd with end stage renal disease on dialysis 03/11/2014    Past Surgical History:  Procedure Laterality Date  . AV FISTULA PLACEMENT Left   . I&D EXTREMITY Left 02/27/2016   Procedure: ARTHROSCOPIC IRRIGATION AND DEBRIDEMENT EXTREMITY;  Surgeon: Renette Butters, MD;  Location: [place];  Service: Orthopedics;  Laterality: Left;  . TEE WITHOUT CARDIOVERSION N/A 02/29/2016   Procedure: TRANSESOPHAGEAL ECHOCARDIOGRAM (TEE);  Surgeon: Thayer Headings, MD;  Location: [place];  Service: Cardiovascular;  Laterality: N/A;     OB History   None      Home Medications    Prior to Admission medications   Medication Sig Start Date End Date Taking? Authorizing Provider  amLODipine (NORVASC) 10 MG tablet Take 10 mg by mouth daily. Hold for SBP <100MM/HG  [provider]  bisacodyl (BISAC-EVAC) 10 MG suppository Place 10 mg rectally once as needed (FOR CONSTIPATION NOT RELIEVED BY MILK OF MAGNESIA).     [provider]  calcium acetate (PHOSLO) 667 MG capsule Take 2,001 mg by mouth 3 (three) times daily.     [provider]  cloNIDine (CATAPRES) 0.1 MG tablet Take 0.1 mg  by mouth every 8 (eight) hours as needed (for a systolic B/P of 858 or greater).     [provider]  hydrocortisone cream 1 % Apply 1 application topically every 8 (eight) hours as needed for itching. Apply to rectum for hemorrhoids    [provider]  isosorbide mononitrate (IMDUR) 60 MG 24 hr tablet Take 60 mg by mouth daily.  03/24/15   [provider]  lamoTRIgine (LAMICTAL) 25 MG tablet Take 50 mg by mouth daily.     [provider]  latanoprost (XALATAN) 0.005 % ophthalmic solution Place 1 drop into both eyes at bedtime.     [provider]  loperamide (IMODIUM A-D) 2 MG tablet Take 4 mg by mouth as needed for diarrhea or loose stools. (pre-dialysis days, per resident request)    [provider]  LORazepam (ATIVAN) 0.5 MG tablet Take 1 tablet (0.5 mg total) by mouth at bedtime. 03/21/18   Medina-Vargas, Monina C, NP  losartan (COZAAR) 50 MG tablet Take 50 mg by mouth at bedtime.    [provider]  multivitamin (RENA-VIT) TABS tablet Take 1 tablet by mouth daily.    [provider]  ondansetron (ZOFRAN ODT) 4 MG disintegrating tablet Take 1 tablet (4 mg total) by mouth every 8 (eight) hours as needed for nausea or vomiting. Patient not taking: Reported on 03/27/2018 01/21/15   Everlene Balls, MD  ondansetron (ZOFRAN) 4 MG tablet Take 4 mg by mouth every 8 (eight) hours as needed for nausea or vomiting.    [provider]  promethazine (PHENERGAN) 25 MG tablet Take 25 mg by mouth every 6 (six) hours as needed for nausea or vomiting.    [provider]  ranitidine (ZANTAC) 300 MG tablet Take 300 mg by mouth at bedtime. Reported on 03/05/2016    [provider]  senna-docusate (SENEXON-S) 8.6-50 MG tablet Take 2 tablets by mouth at bedtime.    [provider]  sertraline (ZOLOFT) 50 MG tablet Take 50 mg by mouth daily.     [provider]  sevelamer carbonate (RENVELA) 800 MG tablet Take  800 mg by mouth 3 (three) times daily with meals.     [provider]  simethicone (MYLICON) 850 MG chewable tablet Chew 125 mg by mouth 3 (three) times daily. Take 1 tablet by mouth prior to each meal at 8AM, 11AM, and 6PM    [provider]  valACYclovir (VALTREX) 500 MG tablet Take 1 tablet (500 mg total) by mouth every other day. Patient taking differently: Take 500 mg by mouth every other day. (On even days) 03/01/16   Hongalgi, Lenis Dickinson, MD    Family History Family History  Problem Relation Age of Onset  . Hypertension Mother   . Hypertension Father     Social History Social History   Tobacco Use  . Smoking status: Never Smoker  . Smokeless tobacco: Never Used  Substance Use Topics  . Alcohol use: No    Alcohol/week: 0.0 oz  . Drug use: No     Allergies   Patient has no known allergies.   Review  of Systems Review of Systems  Unable to perform ROS: Mental status change  Constitutional: Negative for chills and fever.  HENT: Negative for congestion and rhinorrhea.   Eyes: Negative for redness and visual disturbance.  Respiratory: Negative for shortness of breath and wheezing.   Cardiovascular: Negative for chest pain and palpitations.  Gastrointestinal: Negative for nausea and vomiting.  Genitourinary: Negative for dysuria and urgency.  Musculoskeletal: Negative for arthralgias and myalgias.  Skin: Negative for pallor and wound.  Neurological: Positive for weakness. Negative for dizziness and headaches.     Physical Exam Updated Vital Signs BP (!) 173/102   Pulse (!) 55   Resp 12   SpO2 99%   Physical Exam  Constitutional: She appears well-developed and well-nourished. No distress.  HENT:  Head: Normocephalic and atraumatic.  Eyes: Pupils are equal, round, and reactive to light. EOM are normal.  Neck: Normal range of motion. Neck supple.  Cardiovascular: Normal rate and regular rhythm. Exam reveals no gallop and no friction rub.  No murmur  heard. Pulmonary/Chest: Effort normal. She has no wheezes. She has no rales.  Abdominal: Soft. She exhibits no distension. There is no tenderness.  Musculoskeletal: She exhibits no edema or tenderness.  Neurological: She is alert.  Global weakness, slow to respond.  Skin: Skin is warm and dry. She is not diaphoretic.  Psychiatric: She has a normal mood and affect. Her behavior is normal.  Nursing note and vitals reviewed.    ED Treatments / Results  Labs (all labs ordered are listed, but only abnormal results are displayed) Labs Reviewed  CBC - Abnormal; Notable for the following components:      Result Value   WBC 3.1 (*)    RBC 3.08 (*)    Hemoglobin 10.5 (*)    HCT 34.8 (*)    MCV 113.0 (*)    MCH 34.1 (*)    Platelets 88 (*)    All other components within normal limits  DIFFERENTIAL - Abnormal; Notable for the following components:   Lymphs Abs 0.4 (*)    All other components within normal limits  COMPREHENSIVE METABOLIC PANEL - Abnormal; Notable for the following components:   Sodium 134 (*)    Chloride 95 (*)    BUN 51 (*)    Creatinine, Ser 10.96 (*)    Total Protein 9.5 (*)    GFR calc non Af Amer 3 (*)    GFR calc Af Amer 3 (*)    All other components within normal limits  I-STAT CHEM 8, ED - Abnormal; Notable for the following components:   BUN 52 (*)    Creatinine, Ser 12.60 (*)    Glucose, Bld 101 (*)    Calcium, Ion 1.12 (*)    Hemoglobin 11.9 (*)    HCT 35.0 (*)    All other components within normal limits  PROTIME-INR  APTT  URINALYSIS, ROUTINE W REFLEX MICROSCOPIC  I-STAT TROPONIN, ED  CBG MONITORING, ED    EKG EKG Interpretation  Date/Time:  Wednesday April 16 2018 10:19:03 EDT Ventricular Rate:  65 PR Interval:    QRS Duration: 105 QT Interval:  492 QTC Calculation: 512 R Axis:   -27 Text Interpretation:  Sinus rhythm Borderline prolonged PR interval Consider left atrial enlargement Borderline left axis deviation Abnormal R-wave  progression, early transition Prolonged QT interval baseline wander lead I, II TECHNICALLY DIFFICULT Otherwise no significant change Confirmed by Deno Etienne [phone]) on 04/16/2018 10:23:08 AM   Radiology Ct Angio Head W  Or Wo Contrast  Result Date: 04/16/2018 CLINICAL DATA:  Slurred speech. Altered level of consciousness. Last seen normal 2100 hours. Possible right basal ganglia infarction by CT. EXAM: CT ANGIOGRAPHY HEAD AND NECK CT PERFUSION BRAIN TECHNIQUE: Multidetector CT imaging of the head and neck was performed using the standard protocol during bolus administration of intravenous contrast. Multiplanar CT image reconstructions and MIPs were obtained to evaluate the vascular anatomy. Carotid stenosis measurements (when applicable) are obtained utilizing NASCET criteria, using the distal internal carotid diameter as the denominator. Multiphase CT imaging of the brain was performed following IV bolus contrast injection. Subsequent parametric perfusion maps were calculated using RAPID software. CONTRAST:  26m ISOVUE-370 IOPAMIDOL (ISOVUE-370) INJECTION 76% COMPARISON:  CT same day. FINDINGS: CTA NECK FINDINGS Aortic arch: Aortic atherosclerosis. No aneurysm or dissection. Branching pattern of the brachiocephalic vessels is normal without origin stenosis. Right carotid system: Common carotid artery is tortuous but widely patent to the bifurcation. There is minor plaque at the carotid bifurcation but no stenosis. Cervical ICA is tortuous but widely patent. Left carotid system: Common carotid artery is widely patent to the bifurcation. Calcified plaque at the bifurcation and ICA bulb but no stenosis or irregularity. Cervical ICA is tortuous but widely patent. Vertebral arteries: Right vertebral artery is dominant. Both vertebral artery origins are widely patent. Both vertebral arteries are widely patent through the cervical region to the foramen magnum. Skeleton: Ordinary spondylosis. Other neck: No mass or  lymphadenopathy. Upper chest: Negative. Somewhat unusual collateral vein bulging into the suprasternal notch. Review of the MIP images confirms the above findings CTA HEAD FINDINGS Anterior circulation: Both internal carotid arteries are patent through the skull base and siphon regions. There is ordinary atherosclerotic calcification in the carotid siphons but no stenosis. The anterior and middle cerebral vessels are patent without proximal stenosis, aneurysm or vascular malformation. No missing vessels are identified. Posterior circulation: Both vertebral arteries are widely patent to the basilar. No basilar stenosis. Posterior circulation branch vessels are patent. Left PCA takes fetal origin from the anterior circulation. Venous sinuses: Patent and normal. Anatomic variants: None significant. Delayed phase: No abnormal enhancement. Review of the MIP images confirms the above findings CT Brain Perfusion Findings: CBF (<30%) Volume: 079mPerfusion (Tmax>6.0s) volume: 61m7mismatch Volume: 61mL51mfarction Location:None identified. IMPRESSION: Negative CT perfusion study. No confirmatory evidence of right basal ganglia stroke. CT angiography does not show any stenotic disease or large or medium vessel occlusion. Ordinary mild atherosclerosis. Tortuous vessels suggesting a history of hypertension. These results were communicated to Dr. ArorRory Percy10:26 amon 7/17/2019by text page via the AMIONov[facility] Medical Centersaging system. Electronically Signed   By: MarkNelson Chimes.   On: 04/16/2018 10:27   Ct Angio Neck W Or Wo Contrast  Result Date: 04/16/2018 CLINICAL DATA:  Slurred speech. Altered level of consciousness. Last seen normal 2100 hours. Possible right basal ganglia infarction by CT. EXAM: CT ANGIOGRAPHY HEAD AND NECK CT PERFUSION BRAIN TECHNIQUE: Multidetector CT imaging of the head and neck was performed using the standard protocol during bolus administration of intravenous contrast. Multiplanar CT image reconstructions and MIPs  were obtained to evaluate the vascular anatomy. Carotid stenosis measurements (when applicable) are obtained utilizing NASCET criteria, using the distal internal carotid diameter as the denominator. Multiphase CT imaging of the brain was performed following IV bolus contrast injection. Subsequent parametric perfusion maps were calculated using RAPID software. CONTRAST:  95mL15mVUE-370 IOPAMIDOL (ISOVUE-370) INJECTION 76% COMPARISON:  CT same day. FINDINGS: CTA NECK FINDINGS Aortic arch: Aortic atherosclerosis.  No aneurysm or dissection. Branching pattern of the brachiocephalic vessels is normal without origin stenosis. Right carotid system: Common carotid artery is tortuous but widely patent to the bifurcation. There is minor plaque at the carotid bifurcation but no stenosis. Cervical ICA is tortuous but widely patent. Left carotid system: Common carotid artery is widely patent to the bifurcation. Calcified plaque at the bifurcation and ICA bulb but no stenosis or irregularity. Cervical ICA is tortuous but widely patent. Vertebral arteries: Right vertebral artery is dominant. Both vertebral artery origins are widely patent. Both vertebral arteries are widely patent through the cervical region to the foramen magnum. Skeleton: Ordinary spondylosis. Other neck: No mass or lymphadenopathy. Upper chest: Negative. Somewhat unusual collateral vein bulging into the suprasternal notch. Review of the MIP images confirms the above findings CTA HEAD FINDINGS Anterior circulation: Both internal carotid arteries are patent through the skull base and siphon regions. There is ordinary atherosclerotic calcification in the carotid siphons but no stenosis. The anterior and middle cerebral vessels are patent without proximal stenosis, aneurysm or vascular malformation. No missing vessels are identified. Posterior circulation: Both vertebral arteries are widely patent to the basilar. No basilar stenosis. Posterior circulation branch  vessels are patent. Left PCA takes fetal origin from the anterior circulation. Venous sinuses: Patent and normal. Anatomic variants: None significant. Delayed phase: No abnormal enhancement. Review of the MIP images confirms the above findings CT Brain Perfusion Findings: CBF (<30%) Volume: 74m Perfusion (Tmax>6.0s) volume: 033mMismatch Volume: 47m51mnfarction Location:None identified. IMPRESSION: Negative CT perfusion study. No confirmatory evidence of right basal ganglia stroke. CT angiography does not show any stenotic disease or large or medium vessel occlusion. Ordinary mild atherosclerosis. Tortuous vessels suggesting a history of hypertension. These results were communicated to Dr. AroRory Percy 10:26 amon 7/17/2019by text page via the AMIN[facility] Pcssaging system. Electronically Signed   By: MarNelson ChimesD.   On: 04/16/2018 10:27   Ct Cerebral Perfusion W Contrast  Result Date: 04/16/2018 CLINICAL DATA:  Slurred speech. Altered level of consciousness. Last seen normal 2100 hours. Possible right basal ganglia infarction by CT. EXAM: CT ANGIOGRAPHY HEAD AND NECK CT PERFUSION BRAIN TECHNIQUE: Multidetector CT imaging of the head and neck was performed using the standard protocol during bolus administration of intravenous contrast. Multiplanar CT image reconstructions and MIPs were obtained to evaluate the vascular anatomy. Carotid stenosis measurements (when applicable) are obtained utilizing NASCET criteria, using the distal internal carotid diameter as the denominator. Multiphase CT imaging of the brain was performed following IV bolus contrast injection. Subsequent parametric perfusion maps were calculated using RAPID software. CONTRAST:  62m61mOVUE-370 IOPAMIDOL (ISOVUE-370) INJECTION 76% COMPARISON:  CT same day. FINDINGS: CTA NECK FINDINGS Aortic arch: Aortic atherosclerosis. No aneurysm or dissection. Branching pattern of the brachiocephalic vessels is normal without origin stenosis. Right carotid system:  Common carotid artery is tortuous but widely patent to the bifurcation. There is minor plaque at the carotid bifurcation but no stenosis. Cervical ICA is tortuous but widely patent. Left carotid system: Common carotid artery is widely patent to the bifurcation. Calcified plaque at the bifurcation and ICA bulb but no stenosis or irregularity. Cervical ICA is tortuous but widely patent. Vertebral arteries: Right vertebral artery is dominant. Both vertebral artery origins are widely patent. Both vertebral arteries are widely patent through the cervical region to the foramen magnum. Skeleton: Ordinary spondylosis. Other neck: No mass or lymphadenopathy. Upper chest: Negative. Somewhat unusual collateral vein bulging into the suprasternal notch. Review of the MIP images confirms the  above findings CTA HEAD FINDINGS Anterior circulation: Both internal carotid arteries are patent through the skull base and siphon regions. There is ordinary atherosclerotic calcification in the carotid siphons but no stenosis. The anterior and middle cerebral vessels are patent without proximal stenosis, aneurysm or vascular malformation. No missing vessels are identified. Posterior circulation: Both vertebral arteries are widely patent to the basilar. No basilar stenosis. Posterior circulation branch vessels are patent. Left PCA takes fetal origin from the anterior circulation. Venous sinuses: Patent and normal. Anatomic variants: None significant. Delayed phase: No abnormal enhancement. Review of the MIP images confirms the above findings CT Brain Perfusion Findings: CBF (<30%) Volume: 2m Perfusion (Tmax>6.0s) volume: 046mMismatch Volume: 43m643mnfarction Location:None identified. IMPRESSION: Negative CT perfusion study. No confirmatory evidence of right basal ganglia stroke. CT angiography does not show any stenotic disease or large or medium vessel occlusion. Ordinary mild atherosclerosis. Tortuous vessels suggesting a history of  hypertension. These results were communicated to Dr. AroRory Percy 10:26 amon 7/17/2019by text page via the AMIDel[facility] Surgical Suitesssaging system. Electronically Signed   By: MarNelson ChimesD.   On: 04/16/2018 10:27   Dg Chest Port 1 View  Result Date: 04/16/2018 CLINICAL DATA:  Altered mental status. EXAM: PORTABLE CHEST 1 VIEW COMPARISON:  Chest x-ray dated March 27, 2018. FINDINGS: Stable cardiomegaly. Normal pulmonary vascularity. No focal consolidation, pleural effusion, or pneumothorax. No acute osseous abnormality. IMPRESSION: Stable cardiomegaly.  No active disease. Electronically Signed   By: WilTitus DubinD.   On: 04/16/2018 11:26   Ct Head Code Stroke Wo Contrast  Result Date: 04/16/2018 CLINICAL DATA:  Code stroke. Slurred speech. Altered level of consciousness. Last seen normal 2100 hours. EXAM: CT HEAD WITHOUT CONTRAST TECHNIQUE: Contiguous axial images were obtained from the base of the skull through the vertex without intravenous contrast. COMPARISON:  02/20/2018 FINDINGS: Brain: Mild generalized atrophy. Chronic small-vessel ischemic changes of the hemispheric white matter. Slightly more prominent focal low density in the right basal ganglia/anterior limb of the internal capsule which could be an acute infarction. No evidence of large vessel territory infarction. No mass lesion, hemorrhage, hydrocephalus or extra-axial collection. Vascular: There is atherosclerotic calcification of the major vessels at the base of the brain. Skull: Scattered lucencies within the calvarium consistent with the clinical history of myeloma. These are slightly progressive since the prior study. Sinuses/Orbits: Normal Other: None ASPECTS ([place] [facility] Early CT Score) - Ganglionic level infarction (caudate, lentiform nuclei, internal capsule, insula, M1-M3 cortex): 6 - Supraganglionic infarction (M4-M6 cortex): 3 Total score (0-10 with 10 being normal): 9 IMPRESSION: 1. Background pattern of atrophy and chronic  small-vessel ischemic change. More prominent low-density in the basal ganglia/anterior limb internal capsule on the right suggesting an acute infarction in that location. 2. ASPECTS is 9. 3. Slightly progressive calvarial lucencies consistent with the clinical history of myeloma. 4. These results were communicated to Dr. AroRory Percy 9:43 amon 7/17/2019by text page via the AMIO[facility] Centerssaging system. Electronically Signed   By: MarNelson ChimesD.   On: 04/16/2018 09:45    Procedures Procedures (including critical care time)  Medications Ordered in ED Medications  iopamidol (ISOVUE-370) 76 % injection 100 mL (has no administration in time range)  iopamidol (ISOVUE-370) 76 % injection 100 mL (95 mLs Intravenous Contrast Given 04/16/18 1014)     Initial Impression / Assessment and Plan / ED Course  I have reviewed the triage vital signs and the nursing notes.  Pertinent labs & imaging results that were available during my  care of the patient were reviewed by me and considered in my medical decision making (see chart for details).     78 yo F with a chief complaint of altered mental status.  Patient arrived as a code stroke.  Evaluated by neurology, Dr. Malen Gauze.  He felt this is more of an encephalopathy he suspects from worsening renal disease.  She had dialysis yesterday without issue per EMS.  He recommended medical admission for further work-up.  My work-up here with mild elevation in her creatinine from baseline.  BUN seems stable.  Her troponin is negative, chest x-ray reviewed by me without pneumonia.  Imaging of her head was also negative for acute dysfunction.  Patient was initially hypertensive though this had resolved on her second check was 170/88, discussed with the hospitalist for admission.  The patients results and plan were reviewed and discussed.   Any x-rays performed were independently reviewed by myself.   Differential diagnosis were considered with the presenting HPI.  Medications    iopamidol (ISOVUE-370) 76 % injection 100 mL (has no administration in time range)  iopamidol (ISOVUE-370) 76 % injection 100 mL (95 mLs Intravenous Contrast Given 04/16/18 1014)    Vitals:   04/16/18 1100 04/16/18 1115 04/16/18 1130 04/16/18 1145  BP: (!) 172/93 (!) 172/147  (!) 173/102  Pulse: 62 (!) 58 (!) 56 (!) 55  Resp: '18 14 14 12  ' SpO2: 97% 99% 100% 99%    Final diagnoses:  Disorientation    Admission/ observation were discussed with the admitting physician, patient and/or family and they are comfortable with the plan.    Final Clinical Impressions(s) / ED Diagnoses   Final diagnoses:  Disorientation    ED Discharge Orders    None       Deno Etienne, DO 04/16/18 1203

## 2018-04-16 NOTE — H&P (Signed)
History and Physical    [name] NMM:[id number] DOB: 07-Sep-1941 DOA: 04/16/2018  PCP: Hendricks Limes, MD Consultants:  Neurology, nephrology Patient coming from: [facility] ; [facility]:   Chief Complaint: weakness, altered mental status per SNF  HPI: [name] is a 77 y.o. female resident of [facility] with medical history significant of ESRD on HD t/th/sa, htn, multiple myeloma receiving tx weekly, leukopenia, GERD, anxiety/depression, anemia, jehovah's witness presents to ED with difficulty speaking and weakness. Staff noticed altered mental status and weakness when staff went to feed her this am.She was reported normal at 2100 last pm when given meds. At baseline pt has some dementia but communicative, wheelchair bound. Code stroke activated in ED. I am told pt dialyzed yesterday without incident. Difficulty obtaining hx as pt altered on exam.    ED Course: on EMS arrival pt nonverbal and not following commands, but no focal weakness.  CT head with no acute findings and old bilateral basal ganglia infarcts. CT angio head/neck and perfusion study unremarkable. Chest xray unremarkable, afebrile, SBP 190's on arrival. CBG wnl. RN at bedside reports pt seems somewhat more alert than on arrival to ED.   Review of Systems: As per HPI; otherwise review of systems reviewed and negative.   Ambulatory Status: wheelchair bound  Past Medical History:  Diagnosis Date  . Anxiety   . Closed fracture of right distal femur ([place]) 09/01/2015  . Depression   . ESRD (end stage renal disease) on dialysis [facility])    "TTS; [place] [facility]" (04/24/2017)  . GERD (gastroesophageal reflux disease) 04/23/2015  . HCAP (healthcare-associated pneumonia) 04/24/2017  . Heart murmur   . History of blood transfusion    "low HgB when I was going to dialysis center"  . Hypertension   . Hypertension associatd with end stage renal disease on dialysis 03/11/2014  . Malnutrition of moderate degree 09/02/2015  . Multiple  myeloma ([place]) 03/11/2014  . Patient is Jehovah's Witness    "I'd rather never have any blood transfusions unless absolutely necessary; please check with me 1st". (04/24/2017)  . Pneumonia ~ 2016    Past Surgical History:  Procedure Laterality Date  . AV FISTULA PLACEMENT Left   . I&D EXTREMITY Left 02/27/2016   Procedure: ARTHROSCOPIC IRRIGATION AND DEBRIDEMENT EXTREMITY;  Surgeon: Renette Butters, MD;  Location: [facility];  Service: Orthopedics;  Laterality: Left;  . TEE WITHOUT CARDIOVERSION N/A 02/29/2016   Procedure: TRANSESOPHAGEAL ECHOCARDIOGRAM (TEE);  Surgeon: Thayer Headings, MD;  Location: [facility] ENDOSCOPY;  Service: Cardiovascular;  Laterality: N/A;    Social History   Socioeconomic History  . Marital status: Single    Spouse name: Not on file  . Number of children: 3  . Years of education: Not on file  . Highest education level: Not on file  Occupational History  . Occupation: Retired     Comment: Pharmacist, hospital asst.  Social Needs  . Financial resource strain: Not on file  . Food insecurity:    Worry: Not on file    Inability: Not on file  . Transportation needs:    Medical: Not on file    Non-medical: Not on file  Tobacco Use  . Smoking status: Never Smoker  . Smokeless tobacco: Never Used  Substance and Sexual Activity  . Alcohol use: No    Alcohol/week: 0.0 oz  . Drug use: No  . Sexual activity: Never  Lifestyle  . Physical activity:    Days per week: Not on file    Minutes  per session: Not on file  . Stress: Not on file  Relationships  . Social connections:    Talks on phone: Not on file    Gets together: Not on file    Attends religious service: Not on file    Active member of club or organization: Not on file    Attends meetings of clubs or organizations: Not on file    Relationship status: Not on file  . Intimate partner violence:    Fear of current or ex partner: Not on file    Emotionally abused: Not on file    Physically abused: Not on file    Forced  sexual activity: Not on file  Other Topics Concern  . Not on file  Social History Narrative   Patient recently moved to [place] from [place] in May 2015.   Patient was born in [place], [place].   Patient has brother in [place] area.   Long-term care resident of [facility].    No Known Allergies  Family History  Problem Relation Age of Onset  . Hypertension Mother   . Hypertension Father     Prior to Admission medications   Medication Sig Start Date End Date Taking? Authorizing Provider  amLODipine (NORVASC) 10 MG tablet Take 10 mg by mouth daily. Hold for SBP <100MM/HG    [provider]  bisacodyl (BISAC-EVAC) 10 MG suppository Place 10 mg rectally once as needed (FOR CONSTIPATION NOT RELIEVED BY MILK OF MAGNESIA).     [provider]  calcium acetate (PHOSLO) 667 MG capsule Take 2,001 mg by mouth 3 (three) times daily.     [provider]  cloNIDine (CATAPRES) 0.1 MG tablet Take 0.1 mg by mouth every 8 (eight) hours as needed (for a systolic B/P of 161 or greater).     [provider]  hydrocortisone cream 1 % Apply 1 application topically every 8 (eight) hours as needed for itching. Apply to rectum for hemorrhoids    [provider]  isosorbide mononitrate (IMDUR) 60 MG 24 hr tablet Take 60 mg by mouth daily.  03/24/15   [provider]  lamoTRIgine (LAMICTAL) 25 MG tablet Take 50 mg by mouth daily.     [provider]  latanoprost (XALATAN) 0.005 % ophthalmic solution Place 1 drop into both eyes at bedtime.     [provider]  loperamide (IMODIUM A-D) 2 MG tablet Take 4 mg by mouth as needed for diarrhea or loose stools. (pre-dialysis days, per resident request)    [provider]  LORazepam (ATIVAN) 0.5 MG tablet Take 1 tablet (0.5 mg total) by mouth at bedtime. 03/21/18   Medina-Vargas, Monina C, NP  losartan (COZAAR) 50 MG tablet Take 50 mg by mouth at bedtime.    [provider]  multivitamin (RENA-VIT) TABS tablet Take 1 tablet by mouth daily.    [provider]  ondansetron (ZOFRAN ODT) 4 MG disintegrating tablet Take 1 tablet (4 mg total) by mouth every 8 (eight) hours as needed for nausea or vomiting. Patient not taking: Reported on 03/27/2018 01/21/15   Everlene Balls, MD  ondansetron (ZOFRAN) 4 MG tablet Take 4 mg by mouth every 8 (eight) hours as needed for nausea or vomiting.    [provider]  promethazine (PHENERGAN) 25 MG tablet Take 25 mg by mouth every 6 (six) hours as needed for nausea or vomiting.    [provider]  ranitidine (ZANTAC) 300 MG tablet Take 300  mg by mouth at bedtime. Reported on 03/05/2016    [provider]  senna-docusate (SENEXON-S) 8.6-50 MG tablet Take 2 tablets by mouth at bedtime.    [provider]  sertraline (ZOLOFT) 50 MG tablet Take 50 mg by mouth daily.     [provider]  sevelamer carbonate (RENVELA) 800 MG tablet Take 800 mg by mouth 3 (three) times daily with meals.     [provider]  simethicone (MYLICON) 572 MG chewable tablet Chew 125 mg by mouth 3 (three) times daily. Take 1 tablet by mouth prior to each meal at 8AM, 11AM, and 6PM    [provider]  valACYclovir (VALTREX) 500 MG tablet Take 1 tablet (500 mg total) by mouth every other day. Patient taking differently: Take 500 mg by mouth every other day. (On even days) 03/01/16   Modena Jansky, MD    Physical Exam: Vitals:   04/16/18 1230 04/16/18 1245 04/16/18 1300 04/16/18 1302  BP: (!) 198/95 (!) 205/114 (!) 206/182   Pulse: 74 66 69   Resp: '19 14 16   ' Temp:    (!) 96.7 F (35.9 C)  TempSrc:    Rectal  SpO2: 100% 100% 95%      General: elderly, lying supine in stretcher awake, speech unintelligible, but in NAD Eyes: PERRL, EOMI, normal lids, iris ENT: grossly normal hearing, lips & tongue, mmm; appropriate dentition Neck: no LAD, masses or thyromegaly; no carotid  bruits Cardiovascular: S1S2 RRR, no m/r/g. No LE edema.  Respiratory:  CTA bilaterally with no wheezes/rales/rhonchi.  Decreased inspiratory effort. Abdomen: soft, seemingly NT, ND, NABS Back:  normal alignment Skin: excoriation to sacral, perianal area.  Musculoskeletal: pt with difficulty cooperating with exam. No obvious deformities Psychiatric: attempting to answer questions. She is oriented to name and place despite dysphasia Neurologic:  Difficult to assess though no focal deficits are noted. Her gaze is fixed to the left. She attempts to follow commands when asked to squeeze. Strength 1/5 in upper extremities. Unable to test coordination. Gait not tested  Radiological Exams on Admission: Ct Angio Head W Or Wo Contrast  Result Date: 04/16/2018 CLINICAL DATA:  Slurred speech. Altered level of consciousness. Last seen normal 2100 hours. Possible right basal ganglia infarction by CT. EXAM: CT ANGIOGRAPHY HEAD AND NECK CT PERFUSION BRAIN TECHNIQUE: Multidetector CT imaging of the head and neck was performed using the standard protocol during bolus administration of intravenous contrast. Multiplanar CT image reconstructions and MIPs were obtained to evaluate the vascular anatomy. Carotid stenosis measurements (when applicable) are obtained utilizing NASCET criteria, using the distal internal carotid diameter as the denominator. Multiphase CT imaging of the brain was performed following IV bolus contrast injection. Subsequent parametric perfusion maps were calculated using RAPID software. CONTRAST:  61m ISOVUE-370 IOPAMIDOL (ISOVUE-370) INJECTION 76% COMPARISON:  CT same day. FINDINGS: CTA NECK FINDINGS Aortic arch: Aortic atherosclerosis. No aneurysm or dissection. Branching pattern of the brachiocephalic vessels is normal without origin stenosis. Right carotid system: Common carotid artery is tortuous but widely patent to the bifurcation. There is minor plaque at the carotid bifurcation but no  stenosis. Cervical ICA is tortuous but widely patent. Left carotid system: Common carotid artery is widely patent to the bifurcation. Calcified plaque at the bifurcation and ICA bulb but no stenosis or irregularity. Cervical ICA is tortuous but widely patent. Vertebral arteries: Right vertebral artery is dominant. Both vertebral artery origins are widely patent. Both vertebral arteries are widely patent through the cervical region to the  foramen magnum. Skeleton: Ordinary spondylosis. Other neck: No mass or lymphadenopathy. Upper chest: Negative. Somewhat unusual collateral vein bulging into the suprasternal notch. Review of the MIP images confirms the above findings CTA HEAD FINDINGS Anterior circulation: Both internal carotid arteries are patent through the skull base and siphon regions. There is ordinary atherosclerotic calcification in the carotid siphons but no stenosis. The anterior and middle cerebral vessels are patent without proximal stenosis, aneurysm or vascular malformation. No missing vessels are identified. Posterior circulation: Both vertebral arteries are widely patent to the basilar. No basilar stenosis. Posterior circulation branch vessels are patent. Left PCA takes fetal origin from the anterior circulation. Venous sinuses: Patent and normal. Anatomic variants: None significant. Delayed phase: No abnormal enhancement. Review of the MIP images confirms the above findings CT Brain Perfusion Findings: CBF (<30%) Volume: 689m Perfusion (Tmax>6.0s) volume: 068mMismatch Volume: 89m59mnfarction Location:None identified. IMPRESSION: Negative CT perfusion study. No confirmatory evidence of right basal ganglia stroke. CT angiography does not show any stenotic disease or large or medium vessel occlusion. Ordinary mild atherosclerosis. Tortuous vessels suggesting a history of hypertension. These results were communicated to Dr. AroRory Percy 10:26 amon 7/17/2019by text page via the [facility]aging system.  Electronically Signed   By: MarNelson ChimesD.   On: 04/16/2018 10:27   Ct Angio Neck W Or Wo Contrast  Result Date: 04/16/2018 CLINICAL DATA:  Slurred speech. Altered level of consciousness. Last seen normal 2100 hours. Possible right basal ganglia infarction by CT. EXAM: CT ANGIOGRAPHY HEAD AND NECK CT PERFUSION BRAIN TECHNIQUE: Multidetector CT imaging of the head and neck was performed using the standard protocol during bolus administration of intravenous contrast. Multiplanar CT image reconstructions and MIPs were obtained to evaluate the vascular anatomy. Carotid stenosis measurements (when applicable) are obtained utilizing NASCET criteria, using the distal internal carotid diameter as the denominator. Multiphase CT imaging of the brain was performed following IV bolus contrast injection. Subsequent parametric perfusion maps were calculated using RAPID software. CONTRAST:  67m58mOVUE-370 IOPAMIDOL (ISOVUE-370) INJECTION 76% COMPARISON:  CT same day. FINDINGS: CTA NECK FINDINGS Aortic arch: Aortic atherosclerosis. No aneurysm or dissection. Branching pattern of the brachiocephalic vessels is normal without origin stenosis. Right carotid system: Common carotid artery is tortuous but widely patent to the bifurcation. There is minor plaque at the carotid bifurcation but no stenosis. Cervical ICA is tortuous but widely patent. Left carotid system: Common carotid artery is widely patent to the bifurcation. Calcified plaque at the bifurcation and ICA bulb but no stenosis or irregularity. Cervical ICA is tortuous but widely patent. Vertebral arteries: Right vertebral artery is dominant. Both vertebral artery origins are widely patent. Both vertebral arteries are widely patent through the cervical region to the foramen magnum. Skeleton: Ordinary spondylosis. Other neck: No mass or lymphadenopathy. Upper chest: Negative. Somewhat unusual collateral vein bulging into the suprasternal notch. Review of the MIP images  confirms the above findings CTA HEAD FINDINGS Anterior circulation: Both internal carotid arteries are patent through the skull base and siphon regions. There is ordinary atherosclerotic calcification in the carotid siphons but no stenosis. The anterior and middle cerebral vessels are patent without proximal stenosis, aneurysm or vascular malformation. No missing vessels are identified. Posterior circulation: Both vertebral arteries are widely patent to the basilar. No basilar stenosis. Posterior circulation branch vessels are patent. Left PCA takes fetal origin from the anterior circulation. Venous sinuses: Patent and normal. Anatomic variants: None significant. Delayed phase: No abnormal enhancement. Review of the MIP images confirms the above  findings CT Brain Perfusion Findings: CBF (<30%) Volume: 1m Perfusion (Tmax>6.0s) volume: 059mMismatch Volume: 69m56mnfarction Location:None identified. IMPRESSION: Negative CT perfusion study. No confirmatory evidence of right basal ganglia stroke. CT angiography does not show any stenotic disease or large or medium vessel occlusion. Ordinary mild atherosclerosis. Tortuous vessels suggesting a history of hypertension. These results were communicated to Dr. AroRory Percy 10:26 amon 7/17/2019by text page via the [facility] system. Electronically Signed   By: MarNelson ChimesD.   On: 04/16/2018 10:27   Ct Cerebral Perfusion W Contrast  Result Date: 04/16/2018 CLINICAL DATA:  Slurred speech. Altered level of consciousness. Last seen normal 2100 hours. Possible right basal ganglia infarction by CT. EXAM: CT ANGIOGRAPHY HEAD AND NECK CT PERFUSION BRAIN TECHNIQUE: Multidetector CT imaging of the head and neck was performed using the standard protocol during bolus administration of intravenous contrast. Multiplanar CT image reconstructions and MIPs were obtained to evaluate the vascular anatomy. Carotid stenosis measurements (when applicable) are obtained utilizing NASCET  criteria, using the distal internal carotid diameter as the denominator. Multiphase CT imaging of the brain was performed following IV bolus contrast injection. Subsequent parametric perfusion maps were calculated using RAPID software. CONTRAST:  17m43mOVUE-370 IOPAMIDOL (ISOVUE-370) INJECTION 76% COMPARISON:  CT same day. FINDINGS: CTA NECK FINDINGS Aortic arch: Aortic atherosclerosis. No aneurysm or dissection. Branching pattern of the brachiocephalic vessels is normal without origin stenosis. Right carotid system: Common carotid artery is tortuous but widely patent to the bifurcation. There is minor plaque at the carotid bifurcation but no stenosis. Cervical ICA is tortuous but widely patent. Left carotid system: Common carotid artery is widely patent to the bifurcation. Calcified plaque at the bifurcation and ICA bulb but no stenosis or irregularity. Cervical ICA is tortuous but widely patent. Vertebral arteries: Right vertebral artery is dominant. Both vertebral artery origins are widely patent. Both vertebral arteries are widely patent through the cervical region to the foramen magnum. Skeleton: Ordinary spondylosis. Other neck: No mass or lymphadenopathy. Upper chest: Negative. Somewhat unusual collateral vein bulging into the suprasternal notch. Review of the MIP images confirms the above findings CTA HEAD FINDINGS Anterior circulation: Both internal carotid arteries are patent through the skull base and siphon regions. There is ordinary atherosclerotic calcification in the carotid siphons but no stenosis. The anterior and middle cerebral vessels are patent without proximal stenosis, aneurysm or vascular malformation. No missing vessels are identified. Posterior circulation: Both vertebral arteries are widely patent to the basilar. No basilar stenosis. Posterior circulation branch vessels are patent. Left PCA takes fetal origin from the anterior circulation. Venous sinuses: Patent and normal. Anatomic  variants: None significant. Delayed phase: No abnormal enhancement. Review of the MIP images confirms the above findings CT Brain Perfusion Findings: CBF (<30%) Volume: 69mL 36mfusion (Tmax>6.0s) volume: 69mL M8match Volume: 69mL In79mction Location:None identified. IMPRESSION: Negative CT perfusion study. No confirmatory evidence of right basal ganglia stroke. CT angiography does not show any stenotic disease or large or medium vessel occlusion. Ordinary mild atherosclerosis. Tortuous vessels suggesting a history of hypertension. These results were communicated to Dr. Arora ARory Percy26 amon 7/17/2019by text page via the [facility] system. Electronically Signed   By: Mark  SNelson Chimes On: 04/16/2018 10:27   Dg Chest Port 1 View  Result Date: 04/16/2018 CLINICAL DATA:  Altered mental status. EXAM: PORTABLE CHEST 1 VIEW COMPARISON:  Chest x-ray dated March 27, 2018. FINDINGS: Stable cardiomegaly. Normal pulmonary vascularity. No focal consolidation, pleural effusion, or pneumothorax. No acute osseous abnormality.  IMPRESSION: Stable cardiomegaly.  No active disease. Electronically Signed   By: Titus Dubin M.D.   On: 04/16/2018 11:26   Ct Head Code Stroke Wo Contrast  Result Date: 04/16/2018 CLINICAL DATA:  Code stroke. Slurred speech. Altered level of consciousness. Last seen normal 2100 hours. EXAM: CT HEAD WITHOUT CONTRAST TECHNIQUE: Contiguous axial images were obtained from the base of the skull through the vertex without intravenous contrast. COMPARISON:  02/20/2018 FINDINGS: Brain: Mild generalized atrophy. Chronic small-vessel ischemic changes of the hemispheric white matter. Slightly more prominent focal low density in the right basal ganglia/anterior limb of the internal capsule which could be an acute infarction. No evidence of large vessel territory infarction. No mass lesion, hemorrhage, hydrocephalus or extra-axial collection. Vascular: There is atherosclerotic calcification of the major  vessels at the base of the brain. Skull: Scattered lucencies within the calvarium consistent with the clinical history of myeloma. These are slightly progressive since the prior study. Sinuses/Orbits: Normal Other: None ASPECTS ([place] Stroke Program Early CT Score) - Ganglionic level infarction (caudate, lentiform nuclei, internal capsule, insula, M1-M3 cortex): 6 - Supraganglionic infarction (M4-M6 cortex): 3 Total score (0-10 with 10 being normal): 9 IMPRESSION: 1. Background pattern of atrophy and chronic small-vessel ischemic change. More prominent low-density in the basal ganglia/anterior limb internal capsule on the right suggesting an acute infarction in that location. 2. ASPECTS is 9. 3. Slightly progressive calvarial lucencies consistent with the clinical history of myeloma. 4. These results were communicated to Dr. Rory Percy at 9:43 amon 7/17/2019by text page via the [facility] messaging system. Electronically Signed   By: Nelson Chimes M.D.   On: 04/16/2018 09:45    EKG: Independently reviewed.  NSR with rate 65bpm; Twave inversion in anterior leads present 06/2017. Prolonged PR interval  Labs on Admission: I have personally reviewed the available labs and imaging studies at the time of the admission.  Pertinent labs:  WBC 3.1 Hgb 10.5 plts 88 (chronic) Na+134 CO2 24 BUN/Cr 51/10.96 LFTs wnl Trop poc 0.05 INR 1.09   Assessment/Plan Principal Problem:   Encephalopathy acute Active Problems:   Multiple myeloma (HCC)   Anemia in neoplastic disease   GERD (gastroesophageal reflux disease)   Leukopenia due to antineoplastic chemotherapy (HCC)   ESRD (end stage renal disease) on dialysis (HCC)   Anxiety and depression   Hypertension, accelerated   Pressure ulcer, stage I   Acute encephalopathy  -unclear etiology at this point. ?metabolic vs hypertensive. Her initial BP 190's now 170s. Possible medication side effect. Her med rec shows Ativan and lamictal though uncertain when pt received  last -SBP 200's now. Gradual reduction per neurology recs. Will await MRI -I was told she dialyzed yesterday without incident. BUN 52 (62 last month) -no evidence of infectious etiology as pt afebrile, normal WBC. She has told RN that she makes urine so will send for u/a culture. Hold any empiric tx for now.  -CT head and perfusion study negative for acute infarct. Pt seen by neurology who feels acute CVA less likely cause of symptoms based on hx and exam. She has left fixed gaze and concern for stroke on my exam. Will proceed with MRI -Will admit tele -hold benzos and Lamictal for now.  -will ask renal to follow for HD  Hypertension, accelerated -SBP recorded as high as 206 in ED -lower gradually per neurology recs. Unable to take pos right now. Await MRI results to determine if need to allow for permissive htn  ESRD on HD -will ask renal to  follow -continue meds as pta  Multiple Myeloma  -currently on weekly chemo per RN though don't see on med rec  Anxiety and Depression -holding lamictal for now -Monitor  Pressure ulcer, stage I -on sacral area with surrounding excoriation -wound care consult  DVT prophylaxis: SQ Lovenox Code Status: Full - need to confirm with patient/family Family Communication: will try to reach brother Disposition Plan: SNF once clinically improved Consults called: nephrology Admission status: inpt  Patrici Ranks, NP-C [place]  pgr [phone]  If note is complete, please contact covering daytime or nighttime physician. [email or web address] Password [email or web address]  04/16/2018, 2:18 PM

## 2018-04-16 NOTE — Progress Notes (Signed)
   04/16/18 1000  Clinical Encounter Type  Visited With Patient;Health care provider  Visit Type Initial;ED  Stress Factors  Patient Stress Factors  (Appears worried)   While rounding in the ED I visited with this patient.  Patient arrived from [place] code stroke.  Brother had called in to speak with the nurse.  At bedside patient seemed a little overwhelmed and acknowledged she was a little scared.  She continues to be evaluated.  Will follow and support as needed.  Chaplain Katherene Ponto

## 2018-04-16 NOTE — Consult Note (Signed)
Neurology Consultation  Reason for Consult: Acute code stroke for aphasia and generalized weakness Referring Physician: ED provider  CC: Aphasia/generalized weakness  History is obtained from: Chart, patient's nurse via phone  HPI: [name] is a 77 y.o. female who has a past medical history of hypertension, ESRD on dialysis Tuesday Thursday Saturday, documented history of endocarditis-unclear timeline, anxiety and depression, who was last seen normal at her nursing facility at 9 PM on 04/15/2018 and this morning, when the staff went to give her medications was found to be less talkative and not responsive to commands.  There is no report of passing out or loss of consciousness.  Her eyes are open, she is less talkative and responsive.  At baseline she is awake alert oriented x4 per the patient's nurse. The patient was also noted to have some left arm weakness at the facility. EMS was called.  On initial assessment by EMS, there was no focal weakness, no facial droop but the patient was not following commands and was nonverbal. She was brought in as an acute code stroke for evaluation to the hospital. Unable to obtain review of systems due to patient's mentation According to EMS, CBG was normal and systolic blood pressure in the 190s on arrival.  LKW: 9 PM on 04/15/2018 tpa given?: no, nonfocal exam Premorbid modified Rankin scale (mRS): 4-uses a wheelchair to ambulate  ROS: Unable to obtain due to altered mental status.   Past Medical History:  Diagnosis Date  . Anxiety   . Closed fracture of right distal femur ([place]) 09/01/2015  . Depression   . ESRD (end stage renal disease) on dialysis [facility])    "TTS; [place]" (04/24/2017)  . GERD (gastroesophageal reflux disease) 04/23/2015  . HCAP (healthcare-associated pneumonia) 04/24/2017  . Heart murmur   . History of blood transfusion    "low HgB when I was going to dialysis center"  . Hypertension   . Hypertension associatd with end  stage renal disease on dialysis 03/11/2014  . Malnutrition of moderate degree 09/02/2015  . Multiple myeloma ([place]) 03/11/2014  . Patient is Jehov[employer]'s Witness    "I'd rather never have any blood transfusions unless absolutely necessary; please check with me 1st". (04/24/2017)  . Pneumonia ~ 2016    Family History  Problem Relation Age of Onset  . Hypertension Mother   . Hypertension Father    Social History:   reports that she has never smoked. She has never used smokeless tobacco. She reports that she does not drink alcohol or use drugs.  Medications No current facility-administered medications for this encounter.   Current Outpatient Medications:  .  amLODipine (NORVASC) 10 MG tablet, Take 10 mg by mouth daily. Hold for SBP <100MM/HG, Disp: , Rfl:  .  bisacodyl (BISAC-EVAC) 10 MG suppository, Place 10 mg rectally once as needed (FOR CONSTIPATION NOT RELIEVED BY MILK OF MAGNESIA). , Disp: , Rfl:  .  calcium acetate (PHOSLO) 667 MG capsule, Take 2,001 mg by mouth 3 (three) times daily. , Disp: , Rfl:  .  cloNIDine (CATAPRES) 0.1 MG tablet, Take 0.1 mg by mouth every 8 (eight) hours as needed (for a systolic B/P of 196 or greater). , Disp: , Rfl:  .  hydrocortisone cream 1 %, Apply 1 application topically every 8 (eight) hours as needed for itching. Apply to rectum for hemorrhoids, Disp: , Rfl:  .  isosorbide mononitrate (IMDUR) 60 MG 24 hr tablet, Take 60 mg by mouth daily. , Disp: , Rfl:  .  lamoTRIgine (LAMICTAL) 25 MG tablet, Take 50 mg by mouth daily. , Disp: , Rfl:  .  latanoprost (XALATAN) 0.005 % ophthalmic solution, Place 1 drop into both eyes at bedtime. , Disp: , Rfl:  .  loperamide (IMODIUM A-D) 2 MG tablet, Take 4 mg by mouth as needed for diarrhea or loose stools. (pre-dialysis days, per resident request), Disp: , Rfl:  .  LORazepam (ATIVAN) 0.5 MG tablet, Take 1 tablet (0.5 mg total) by mouth at bedtime., Disp: 30 tablet, Rfl: 0 .  losartan (COZAAR) 50 MG tablet, Take 50 mg by  mouth at bedtime., Disp: , Rfl:  .  multivitamin (RENA-VIT) TABS tablet, Take 1 tablet by mouth daily., Disp: , Rfl:  .  ondansetron (ZOFRAN ODT) 4 MG disintegrating tablet, Take 1 tablet (4 mg total) by mouth every 8 (eight) hours as needed for nausea or vomiting. (Patient not taking: Reported on 03/27/2018), Disp: 12 tablet, Rfl: 0 .  ondansetron (ZOFRAN) 4 MG tablet, Take 4 mg by mouth every 8 (eight) hours as needed for nausea or vomiting., Disp: , Rfl:  .  promethazine (PHENERGAN) 25 MG tablet, Take 25 mg by mouth every 6 (six) hours as needed for nausea or vomiting., Disp: , Rfl:  .  ranitidine (ZANTAC) 300 MG tablet, Take 300 mg by mouth at bedtime. Reported on 03/05/2016, Disp: , Rfl:  .  senna-docusate (SENEXON-S) 8.6-50 MG tablet, Take 2 tablets by mouth at bedtime., Disp: , Rfl:  .  sertraline (ZOLOFT) 50 MG tablet, Take 50 mg by mouth daily. , Disp: , Rfl:  .  sevelamer carbonate (RENVELA) 800 MG tablet, Take 800 mg by mouth 3 (three) times daily with meals. , Disp: , Rfl:  .  simethicone (MYLICON) 389 MG chewable tablet, Chew 125 mg by mouth 3 (three) times daily. Take 1 tablet by mouth prior to each meal at 8AM, 11AM, and 6PM, Disp: , Rfl:  .  valACYclovir (VALTREX) 500 MG tablet, Take 1 tablet (500 mg total) by mouth every other day. (Patient taking differently: Take 500 mg by mouth every other day. (On even days)), Disp: , Rfl:   Exam: Current vital signs: BP (!) 192/151   Pulse 65   Resp 14   SpO2 97%  Vital signs in last 24 hours: Pulse Rate:  [65] 65 (07/17 1030) Resp:  [14] 14 (07/17 1030) BP: (192)/(151) 192/151 (07/17 1030) SpO2:  [97 %] 97 % (07/17 1030) General: Patient is awake alert in no acute distress HEENT: Normocephalic atraumatic Lungs clear to auscultation Cardiovascular: S1-S2 heard, regular rate rhythm, hypertensive Extremities: Warm well perfused, left arm AV fistula with thrill Neurological exam Patient is awake, alert, oriented to self She has extremely  poor attention concentration. Initially not answering any questions and following commands.  After a few minutes told me her name.  Was able to follow commands after a lag. Cranial nerves: Pupils equal round react light, extraocular was intact, visual fields full, face symmetric, tongue midline. Motor exam: No focal weakness observed.  Patient had poor recent consultation and did not follow commands consistently.  May be slightly more weak on the left upper extremity than right but there was also a lot of giveaway weakness. Sensory exam: Grimaces to noxious stimulus in all 4 Coordination: Did not cooperate Gait testing was deferred at this time NIHSS 1a Level of Conscious.: 0 1b LOC Questions: 2 1c LOC Commands: 1 2 Best Gaze: 0 3 Visual: 0 4 Facial Palsy: 0 5a Motor Arm - left:  2 5b Motor Arm - Right: 2 6a Motor Leg - Left: 2 6b Motor Leg - Right: 2 7 Limb Ataxia: 0 8 Sensory: 0 9 Best Language: 2 10 Dysarthria: 0 11 Extinct. and Inatten.: 0 TOTAL: 13  Labs I have reviewed labs in epic and the results pertinent to this consultation are:  CBC    Component Value Date/Time   WBC 5.5 03/27/2018 2235   RBC 2.39 (L) 03/27/2018 2235   HGB 11.9 (L) 04/16/2018 0935   HGB 11.5 (L) 03/04/2015 0855   HCT 35.0 (L) 04/16/2018 0935   HCT 35.9 03/04/2015 0855   PLT 72 (L) 03/27/2018 2235   PLT 158 03/04/2015 0855   MCV 114.6 (H) 03/27/2018 2235   MCV 99.1 03/04/2015 0855   MCH 34.7 (H) 03/27/2018 2235   MCHC 30.3 03/27/2018 2235   RDW 15.9 (H) 03/27/2018 2235   RDW 17.2 (H) 03/04/2015 0855   LYMPHSABS 0.4 (L) 03/27/2018 2235   LYMPHSABS 0.8 (L) 03/04/2015 0855   MONOABS 0.3 03/27/2018 2235   MONOABS 0.5 03/04/2015 0855   EOSABS 0.1 03/27/2018 2235   EOSABS 0.0 03/04/2015 0855   BASOSABS 0.0 03/27/2018 2235   BASOSABS 0.0 03/04/2015 0855    CMP     Component Value Date/Time   NA 136 04/16/2018 0935   NA 136 (A) 10/03/2017   NA 135 (L) 02/25/2015 1002   K 3.8 04/16/2018  0935   K 4.2 02/25/2015 1002   CL 98 04/16/2018 0935   CO2 25 03/27/2018 2235   CO2 29 02/25/2015 1002   GLUCOSE 101 (H) 04/16/2018 0935   GLUCOSE 86 02/25/2015 1002   BUN 52 (H) 04/16/2018 0935   BUN 24 (A) 10/03/2017   BUN 22.3 02/25/2015 1002   CREATININE 12.60 (H) 04/16/2018 0935   CREATININE 5.6 (HH) 02/25/2015 1002   CALCIUM 9.4 03/27/2018 2235   CALCIUM 9.8 02/25/2015 1002   PROT 8.3 (H) 03/27/2018 2235   PROT 8.6 (H) 02/25/2015 1002   ALBUMIN 2.8 (L) 03/27/2018 2235   ALBUMIN 3.1 (L) 02/25/2015 1002   AST 30 03/27/2018 2235   AST 16 02/25/2015 1002   ALT 16 03/27/2018 2235   ALT 10 02/25/2015 1002   ALKPHOS 34 (L) 03/27/2018 2235   ALKPHOS 55 02/25/2015 1002   BILITOT 0.4 03/27/2018 2235   BILITOT 0.52 02/25/2015 1002   GFRNONAA 4 (L) 03/27/2018 2235   GFRAA 4 (L) 03/27/2018 2235   Imaging I have reviewed the images obtained: CT-scan of the brain-no acute changes.  Bilateral basal ganglia lacunar infarcts CT angios perfusion shows no LVO  Assessment:  77 year old woman past medical history of hypertension, ESRD on dialysis, unclear history of endocarditis at some point, anxiety and depression last seen normal at 9 PM brought in as a code stroke for altered mental status and possible aphasia. On examination has subtle left-sided weakness if that along with examination consistent for encephalopathy. Her laboratory findings reveal severely increased creatinine, even higher than her baseline. Imaging does not reveal any evidence of new or acute stroke. Outside the window for IV TPA No LVO to be a candidate for thrombectomy.  Impression: Toxic metabolic encephalopathy ? Hypertensive encephalopathy Was brought in as a code stroke but stroke is low in the differentials Hypertension  Recommendations: I would recommend correction of toxic metabolic derangements per primary team. Gradual reduction of blood pressure-no more than by 20% each day to goal of less than  140/90 upon discharge. She has received IV contrast for the  CT angios/perfusion-will probably need dialysis tomorrow. I would not recommend pursuing any stroke work-up due to nonfocal exam and vague symptomatology. In the past, she has had some left-sided weakness in the arm where she has the fistula and I am unable to confirm on my exam if there is any stark focality. I have communicated my plan to the ED provider Dr. Tyrone Nine. No further neurological recommendations at this point. Please call neurology with questions  -- Amie Portland, MD [facility] Pager: [phone] If 7pm to 7am, please call on call as listed on AMION.

## 2018-04-16 NOTE — ED Notes (Signed)
Pt more alert than at upon arrival, though speech slurred.

## 2018-04-16 NOTE — Code Documentation (Addendum)
77 yo female coming from [place] with complains of aphasia and bilateral weakness that started this morning. Pt was LKW Last Night at 2100 when she was given her night medications. This morning when staff went to help feed patient, pt was not speaking with staff and would not move legs bilaterally. EMS was called and activated a Code Stroke. Stroke Team met patient soon after arrival and took patient to CT. CT Head completed with no hemorrhage. CTP completed. Pt is not tPA candidate due to be outside of the window. Not IR candidate due to pre modified rankin of 4. Pt is in a wheelchair at baseline, but able to get herself around. Initial NIHSS 15 due to inability to answer questions, inability to follow all commands, bilateral arm and leg weakness, moderate aphasia and dysarthria. Creatinine noted to be 12.6 and BUN 52. Hx of dialysis with access on the left arm. Code Stroke cancelled due to belief that symptoms are metabolic. Pt to continue being monitored. Handoff given to [place], Therapist, sports.

## 2018-04-17 LAB — BASIC METABOLIC PANEL
ANION GAP: 13 (ref 5–15)
BUN: 59 mg/dL — AB (ref 8–23)
CO2: 24 mmol/L (ref 22–32)
CREATININE: 12.59 mg/dL — AB (ref 0.44–1.00)
Calcium: 9.5 mg/dL (ref 8.9–10.3)
Chloride: 99 mmol/L (ref 98–111)
GFR calc Af Amer: 3 mL/min — ABNORMAL LOW (ref >60)
GFR, EST NON AFRICAN AMERICAN: 2 mL/min — AB (ref >60)
Glucose, Bld: 85 mg/dL (ref 70–99)
Potassium: 4.3 mmol/L (ref 3.5–5.1)
Sodium: 136 mmol/L (ref 135–145)

## 2018-04-17 LAB — CBC
HCT: 31.2 % — ABNORMAL LOW (ref 36.0–46.0)
Hemoglobin: 9.9 g/dL — ABNORMAL LOW (ref 12.0–15.0)
MCH: 34.5 pg — ABNORMAL HIGH (ref 26.0–34.0)
MCHC: 31.7 g/dL (ref 30.0–36.0)
MCV: 108.7 fL — AB (ref 78.0–100.0)
Platelets: 99 10*3/uL — ABNORMAL LOW (ref 150–400)
RBC: 2.87 MIL/uL — ABNORMAL LOW (ref 3.87–5.11)
RDW: 14.5 % (ref 11.5–15.5)
WBC: 2.7 10*3/uL — AB (ref 4.0–10.5)

## 2018-04-17 LAB — MRSA PCR SCREENING: MRSA BY PCR: NEGATIVE

## 2018-04-17 MED ORDER — SODIUM CHLORIDE 0.9 % IV SOLN: 100.0000 mL | INTRAVENOUS | Status: DC | PRN

## 2018-04-17 MED ORDER — CLONIDINE HCL 0.1 MG PO TABS: 0.1000 mg | ORAL_TABLET | Freq: Three times a day (TID) | ORAL | Status: DC | PRN

## 2018-04-17 MED ORDER — DOXERCALCIFEROL 4 MCG/2ML IV SOLN
INTRAVENOUS | Status: AC
  Filled 2018-04-17: qty 2

## 2018-04-17 MED ORDER — DOXERCALCIFEROL 4 MCG/2ML IV SOLN
4.0000 ug | INTRAVENOUS | Status: DC
  Administered 2018-04-17: 4 ug via INTRAVENOUS
  Filled 2018-04-17: qty 2

## 2018-04-17 MED ORDER — DARBEPOETIN ALFA 100 MCG/0.5ML IJ SOSY
100.0000 ug | PREFILLED_SYRINGE | INTRAMUSCULAR | Status: DC
  Filled 2018-04-17: qty 0.5

## 2018-04-17 MED ORDER — AMLODIPINE BESYLATE 10 MG PO TABS
10.0000 mg | ORAL_TABLET | Freq: Every day | ORAL | Status: DC
  Administered 2018-04-18: 10 mg via ORAL
  Filled 2018-04-17: qty 1

## 2018-04-17 MED ORDER — LORAZEPAM 0.5 MG PO TABS
0.5000 mg | ORAL_TABLET | Freq: Every day | ORAL | Status: DC
  Administered 2018-04-17: 0.5 mg via ORAL
  Filled 2018-04-17: qty 1

## 2018-04-17 MED ORDER — LIDOCAINE HCL (PF) 1 % IJ SOLN: 5.0000 mL | INTRAMUSCULAR | Status: DC | PRN

## 2018-04-17 MED ORDER — LORAZEPAM 2 MG/ML IJ SOLN
1.0000 mg | Freq: Once | INTRAMUSCULAR | Status: AC
  Administered 2018-04-17: 1 mg via INTRAVENOUS
  Filled 2018-04-17: qty 1

## 2018-04-17 MED ORDER — CHLORHEXIDINE GLUCONATE CLOTH 2 % EX PADS
6.0000 | MEDICATED_PAD | Freq: Every day | CUTANEOUS | Status: DC
  Administered 2018-04-17 – 2018-04-18 (×2): 6 via TOPICAL

## 2018-04-17 MED ORDER — EPOETIN ALFA-EPBX 10000 UNIT/ML IJ SOLN
19000.0000 [IU] | INTRAMUSCULAR | Status: DC
  Filled 2018-04-17: qty 2

## 2018-04-17 MED ORDER — LIDOCAINE-PRILOCAINE 2.5-2.5 % EX CREA: 1.0000 "application " | TOPICAL_CREAM | CUTANEOUS | Status: DC | PRN

## 2018-04-17 MED ORDER — ALTEPLASE 2 MG IJ SOLR: 2.0000 mg | Freq: Once | INTRAMUSCULAR | Status: DC | PRN

## 2018-04-17 MED ORDER — PENTAFLUOROPROP-TETRAFLUOROETH EX AERO: 1.0000 "application " | INHALATION_SPRAY | CUTANEOUS | Status: DC | PRN

## 2018-04-17 MED ORDER — DARBEPOETIN ALFA 100 MCG/0.5ML IJ SOSY
100.0000 ug | PREFILLED_SYRINGE | INTRAMUSCULAR | Status: DC
  Administered 2018-04-17: 100 ug via INTRAVENOUS
  Filled 2018-04-17: qty 0.5

## 2018-04-17 MED ORDER — HEPARIN SODIUM (PORCINE) 1000 UNIT/ML DIALYSIS
1000.0000 [IU] | INTRAMUSCULAR | Status: DC | PRN
  Filled 2018-04-17 (×2): qty 1

## 2018-04-17 MED ORDER — DARBEPOETIN ALFA 100 MCG/0.5ML IJ SOSY
PREFILLED_SYRINGE | INTRAMUSCULAR | Status: AC
  Filled 2018-04-17: qty 0.5

## 2018-04-17 NOTE — Progress Notes (Addendum)
Notified by Claiborne Billings, RN of MD request to repeat stroke swallow screen. Pt. Previously failed swallow screen earlier today (see progress note.) Dr. Tawanna Solo called by Claiborne Billings, RN and notified that swallow screen could not be repeated due to previous failure and that ST is not here currently. This Probation officer (Agricultural consultant) also spoke with the MD and re-affirmed that we are not allowed to repeat it after a patient is failed per the current protocol. Per Dr, Tawanna Solo, he wants the patient to eat tonight and he is ordering a diet. He also requested ST follow up with swallow eval tomorrow. This has already been ordered.

## 2018-04-17 NOTE — Progress Notes (Signed)
Patient is going off the unit to dialysis. Patient is now wake and was sitting up in the bed. She was alert to self but disoriented to time, place and situation.

## 2018-04-17 NOTE — Procedures (Signed)
Patient was seen on dialysis and the procedure was supervised.  BFR 425  Via AVF BP is  155/102.   Patient appears to be tolerating treatment well  Keyasia Jolliff A 04/17/2018

## 2018-04-17 NOTE — Progress Notes (Addendum)
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  Patient presented with slurred speech, left deviated gaze as well as left facial droop.  Neurology was consulted on presentation.  Stroke was suspected but MRI did not show any acute intracranial abnormalities.  Altered mental status has been thought secondary to toxic metabolic encephalopathy or uremia.  Nephrology has been consulted.  She is undergoing dialysis today.  Assessment & Plan:   Principal Problem:   Encephalopathy acute Active Problems:   Multiple myeloma (HCC)   Anemia in neoplastic disease   GERD (gastroesophageal reflux disease)   Leukopenia due to antineoplastic chemotherapy (HCC)   ESRD (end stage renal disease) on dialysis (HCC)   Anxiety and depression   Hypertension, accelerated   Pressure ulcer, stage I  Acute encephalopathy: Unclear etiology at this point.  Could be toxic metabolic encephalopathy.  Could be from uremia too.  She was noted to be hypertensive on presentation.  Stroke was suspected on presentation.  MRI of the brain did not show any acute intracranial abnormalities and CT imaging did not show any obstruction or stenosis in the vessels.  Patient remains weak, lethargic and altered this morning.  She was briefly agitated also.  She does not have any clinical signs of infection.  Will hold her psychotropic medications.  ESRD on dialysis: Dialyzed on TTS.  Nephrology following and she is being dialyzed today.  Hypertension: Systolic blood pressure recorded in the range of 200s  on presentation.  Blood pressure slightly improved today.  Anticipate improvement after  dialysis.Continue home meds.  History of multiple myeloma: Currently on weekly chemotherapy as per the report.  Follow-up with oncology as an outpatient at [facility].  On valacyclovir every other day  Leukopenia/thrombocytopenia: Chronic.  Secondary to multiple myeloma/chemotherapy.  We will continue to monitor.  History of anxiety/depression: On Lamictal/Zoloft  at the facilty.  Will hold.  History of coronary disease: On Imdur.  No complaints of chest pain.  Stage I pressure ulcer: Continue supportive care.  DVT prophylaxis: Heparin Code Status: Full Family Communication: None present at the bedside.Called daughter on phone, not received  Disposition Plan: Back to skilled nursing facility after improvement in the mental status   Consultants: Nephrology  Procedures: None  Antimicrobials: None  Subjective: Patient seen and examined the bedside this morning.  Still remains altered. Was reported to be agitated this morning.  Looks lethargic.   Objective: Vitals:   04/17/18 0219 04/17/18 0253 04/17/18 0318 04/17/18 0743  BP: (!) 198/118 (!) 173/98 (!) 190/102 (!) 181/105  Pulse: 97 85 83 69  Resp: 20   19  Temp: (!) 97.5 F (36.4 C)  98.4 F (36.9 C) 97.9 F (36.6 C)  TempSrc: Axillary  Oral Axillary  SpO2: 93%  95% 97%    Intake/Output Summary (Last 24 hours) at 04/17/2018 0935 Last data filed at 04/17/2018 0430 Gross per 24 hour  Intake 120 ml  Output 200 ml  Net -80 ml   There were no vitals filed for this visit.  Examination:  General exam: Not in distress,weak and lethargic HEENT:PERRL,Oral mucosa moist, Ear/Nose normal on gross exam Respiratory system: Bilateral equal air entry, normal vesicular breath sounds, no wheezes or crackles  Cardiovascular system: S1 & S2 heard, RRR. No JVD, murmurs, rubs, gallops or clicks. No pedal edema. Gastrointestinal system: Abdomen is nondistended, soft and nontender. No organomegaly or masses felt. Normal bowel sounds  heard. Central nervous system: Not Alert and oriented.  Neurological examination not done Extremities: No edema, no clubbing ,no cyanosis, distal peripheral pulses palpable.  Hemodialysis fistula on the left arm Skin: No rashes, lesions or ulcers,no icterus ,no pallor     Data Reviewed: I have personally reviewed following labs and imaging studies  CBC: Recent Labs  Lab 04/16/18 0929 04/16/18 0935 04/16/18 1443 04/17/18 0610  WBC 3.1*  --  2.6* 2.7*  NEUTROABS 1.8  --   --   --   HGB 10.5* 11.9* 10.0* 9.9*  HCT 34.8* 35.0* 32.3* 31.2*  MCV 113.0*  --  111.4* 108.7*  PLT 88*  --  103* 99*   Basic Metabolic Panel: Recent Labs  Lab 04/16/18 0929 04/16/18 0935 04/16/18 1443 04/17/18 0610  NA 134* 136  --  136  K 3.9 3.8  --  4.3  CL 95* 98  --  99  CO2 24  --   --  24  GLUCOSE 96 101*  --  85  BUN 51* 52*  --  59*  CREATININE 10.96* 12.60* 11.42* 12.59*  CALCIUM 9.9  --   --  9.5   GFR: CrCl cannot be calculated (Unknown ideal weight.). Liver Function Tests: Recent Labs  Lab 04/16/18 0929  AST 22  ALT 12  ALKPHOS 42  BILITOT 0.8  PROT 9.5*  ALBUMIN 3.8   No results for input(s): LIPASE, AMYLASE in the last 168 hours. No results for input(s): AMMONIA in the last 168 hours. Coagulation Profile: Recent Labs  Lab 04/16/18 0929  INR 1.09   Cardiac Enzymes: Recent Labs  Lab 04/16/18 1443  TROPONINI 0.03*   BNP (last 3 results) No results for input(s): PROBNP in the last 8760 hours. HbA1C: No results for input(s): HGBA1C in the last 72 hours. CBG: No results for input(s): GLUCAP in the last 168 hours. Lipid Profile: No results for input(s): CHOL, HDL, LDLCALC, TRIG, CHOLHDL, LDLDIRECT in the last 72 hours. Thyroid Function Tests: Recent Labs    04/16/18 1443  TSH 2.306   Anemia Panel: No results for input(s): VITAMINB12, FOLATE, FERRITIN, TIBC, IRON, RETICCTPCT in the last 72 hours. Sepsis Labs: No results for input(s): PROCALCITON,  LATICACIDVEN in the last 168 hours.  Recent Results (from the past 240 hour(s))  MRSA PCR Screening     Status: None   Collection Time: 04/16/18  8:56 PM  Result Value Ref Range Status   MRSA by PCR NEGATIVE NEGATIVE Final    Comment:        The GeneXpert MRSA Assay (FDA approved for NASAL specimens only), is one component of a comprehensive MRSA colonization surveillance program. It is not intended to diagnose MRSA infection nor to guide or monitor treatment for MRSA infections. Performed at [facility], [place] [address], [place], [place] [address]          Radiology Studies: Ct Angio Head W Or Wo Contrast  Result Date: 04/16/2018 CLINICAL DATA:  Slurred speech. Altered level of consciousness. Last seen normal 2100 hours. Possible right basal ganglia infarction by CT. EXAM: CT ANGIOGRAPHY HEAD AND NECK CT PERFUSION BRAIN TECHNIQUE: Multidetector CT imaging of the head and neck was performed using the standard protocol during bolus administration of intravenous contrast. Multiplanar CT image reconstructions and MIPs were  obtained to evaluate the vascular anatomy. Carotid stenosis measurements (when applicable) are obtained utilizing NASCET criteria, using the distal internal carotid diameter as the denominator. Multiphase CT imaging of the brain was performed following IV bolus contrast injection. Subsequent parametric perfusion maps were calculated using RAPID software. CONTRAST:  36m ISOVUE-370 IOPAMIDOL (ISOVUE-370) INJECTION 76% COMPARISON:  CT same day. FINDINGS: CTA NECK FINDINGS Aortic arch: Aortic atherosclerosis. No aneurysm or dissection. Branching pattern of the brachiocephalic vessels is normal without origin stenosis. Right carotid system: Common carotid artery is tortuous but widely patent to the bifurcation. There is minor plaque at the carotid bifurcation but no stenosis. Cervical ICA is tortuous but widely patent. Left carotid system: Common carotid artery is widely  patent to the bifurcation. Calcified plaque at the bifurcation and ICA bulb but no stenosis or irregularity. Cervical ICA is tortuous but widely patent. Vertebral arteries: Right vertebral artery is dominant. Both vertebral artery origins are widely patent. Both vertebral arteries are widely patent through the cervical region to the foramen magnum. Skeleton: Ordinary spondylosis. Other neck: No mass or lymphadenopathy. Upper chest: Negative. Somewhat unusual collateral vein bulging into the suprasternal notch. Review of the MIP images confirms the above findings CTA HEAD FINDINGS Anterior circulation: Both internal carotid arteries are patent through the skull base and siphon regions. There is ordinary atherosclerotic calcification in the carotid siphons but no stenosis. The anterior and middle cerebral vessels are patent without proximal stenosis, aneurysm or vascular malformation. No missing vessels are identified. Posterior circulation: Both vertebral arteries are widely patent to the basilar. No basilar stenosis. Posterior circulation branch vessels are patent. Left PCA takes fetal origin from the anterior circulation. Venous sinuses: Patent and normal. Anatomic variants: None significant. Delayed phase: No abnormal enhancement. Review of the MIP images confirms the above findings CT Brain Perfusion Findings: CBF (<30%) Volume: 026mPerfusion (Tmax>6.0s) volume: 48m89mismatch Volume: 48mL13mfarction Location:None identified. IMPRESSION: Negative CT perfusion study. No confirmatory evidence of right basal ganglia stroke. CT angiography does not show any stenotic disease or large or medium vessel occlusion. Ordinary mild atherosclerosis. Tortuous vessels suggesting a history of hypertension. These results were communicated to Dr. ArorRory Percy10:26 amon 7/17/2019by text page via the AMIO[facility] Systemsaging system. Electronically Signed   By: MarkNelson Chimes.   On: 04/16/2018 10:27   Ct Angio Neck W Or Wo Contrast  Result  Date: 04/16/2018 CLINICAL DATA:  Slurred speech. Altered level of consciousness. Last seen normal 2100 hours. Possible right basal ganglia infarction by CT. EXAM: CT ANGIOGRAPHY HEAD AND NECK CT PERFUSION BRAIN TECHNIQUE: Multidetector CT imaging of the head and neck was performed using the standard protocol during bolus administration of intravenous contrast. Multiplanar CT image reconstructions and MIPs were obtained to evaluate the vascular anatomy. Carotid stenosis measurements (when applicable) are obtained utilizing NASCET criteria, using the distal internal carotid diameter as the denominator. Multiphase CT imaging of the brain was performed following IV bolus contrast injection. Subsequent parametric perfusion maps were calculated using RAPID software. CONTRAST:  95mL53mVUE-370 IOPAMIDOL (ISOVUE-370) INJECTION 76% COMPARISON:  CT same day. FINDINGS: CTA NECK FINDINGS Aortic arch: Aortic atherosclerosis. No aneurysm or dissection. Branching pattern of the brachiocephalic vessels is normal without origin stenosis. Right carotid system: Common carotid artery is tortuous but widely patent to the bifurcation. There is minor plaque at the carotid bifurcation but no stenosis. Cervical ICA is tortuous but widely patent. Left carotid system: Common carotid artery is widely patent to the bifurcation. Calcified plaque at the bifurcation and  ICA bulb but no stenosis or irregularity. Cervical ICA is tortuous but widely patent. Vertebral arteries: Right vertebral artery is dominant. Both vertebral artery origins are widely patent. Both vertebral arteries are widely patent through the cervical region to the foramen magnum. Skeleton: Ordinary spondylosis. Other neck: No mass or lymphadenopathy. Upper chest: Negative. Somewhat unusual collateral vein bulging into the suprasternal notch. Review of the MIP images confirms the above findings CTA HEAD FINDINGS Anterior circulation: Both internal carotid arteries are patent  through the skull base and siphon regions. There is ordinary atherosclerotic calcification in the carotid siphons but no stenosis. The anterior and middle cerebral vessels are patent without proximal stenosis, aneurysm or vascular malformation. No missing vessels are identified. Posterior circulation: Both vertebral arteries are widely patent to the basilar. No basilar stenosis. Posterior circulation branch vessels are patent. Left PCA takes fetal origin from the anterior circulation. Venous sinuses: Patent and normal. Anatomic variants: None significant. Delayed phase: No abnormal enhancement. Review of the MIP images confirms the above findings CT Brain Perfusion Findings: CBF (<30%) Volume: 18m Perfusion (Tmax>6.0s) volume: 025mMismatch Volume: 6m11mnfarction Location:None identified. IMPRESSION: Negative CT perfusion study. No confirmatory evidence of right basal ganglia stroke. CT angiography does not show any stenotic disease or large or medium vessel occlusion. Ordinary mild atherosclerosis. Tortuous vessels suggesting a history of hypertension. These results were communicated to Dr. AroRory Percy 10:26 amon 7/17/2019by text page via the AMIV[facility] Manchesterssaging system. Electronically Signed   By: MarNelson ChimesD.   On: 04/16/2018 10:27   Mr Brain Wo Contrast  Result Date: 04/16/2018 CLINICAL DATA:  Speech difficulty. Encephalopathy. Weakness. History of end-stage renal disease and multiple myeloma. EXAM: MRI HEAD WITHOUT CONTRAST TECHNIQUE: Multiplanar, multiecho pulse sequences of the brain and surrounding structures were obtained without intravenous contrast. COMPARISON:  Head CT 04/16/2018 FINDINGS: The examination had to be discontinued prior to completion due to patient's altered mental status. A complete noncontrast examination was obtained with the exception of sagittal and axial T1 weighted imaging. The study is motion degraded throughout, with moderate to severe motion on the axial FLAIR and coronal T2  sequences. Brain: There is no evidence of acute infarct, mass, midline shift, or extra-axial fluid collection. A single focus of chronic microhemorrhage is noted in the posteromedial right temporal lobe. There is mild cerebral atrophy. Patchy T2 hyperintensities are present in the cerebral white matter, nonspecific but compatible with mild chronic small vessel ischemic disease. Chronic lacunar infarcts are present in the left caudate nucleus, left thalamus, and left cerebellum. Vascular: Major intracranial vascular flow voids are preserved. Skull and upper cervical spine: Diffuse calvarial marrow heterogeneity which likely reflects patient's history of myeloma. Sinuses/Orbits: Unremarkable orbits. Paranasal sinuses and mastoid air cells are clear. Other: None. IMPRESSION: 1. Incomplete, motion degraded examination. 2. No acute infarct or other acute intracranial abnormality identified. 3. Mild chronic small vessel ischemic disease with chronic lacunar infarcts as above. Electronically Signed   By: AllLogan BoresD.   On: 04/16/2018 15:59   Ct Cerebral Perfusion W Contrast  Result Date: 04/16/2018 CLINICAL DATA:  Slurred speech. Altered level of consciousness. Last seen normal 2100 hours. Possible right basal ganglia infarction by CT. EXAM: CT ANGIOGRAPHY HEAD AND NECK CT PERFUSION BRAIN TECHNIQUE: Multidetector CT imaging of the head and neck was performed using the standard protocol during bolus administration of intravenous contrast. Multiplanar CT image reconstructions and MIPs were obtained to evaluate the vascular anatomy. Carotid stenosis measurements (when applicable) are obtained utilizing NASCET criteria, using  the distal internal carotid diameter as the denominator. Multiphase CT imaging of the brain was performed following IV bolus contrast injection. Subsequent parametric perfusion maps were calculated using RAPID software. CONTRAST:  52m ISOVUE-370 IOPAMIDOL (ISOVUE-370) INJECTION 76% COMPARISON:   CT same day. FINDINGS: CTA NECK FINDINGS Aortic arch: Aortic atherosclerosis. No aneurysm or dissection. Branching pattern of the brachiocephalic vessels is normal without origin stenosis. Right carotid system: Common carotid artery is tortuous but widely patent to the bifurcation. There is minor plaque at the carotid bifurcation but no stenosis. Cervical ICA is tortuous but widely patent. Left carotid system: Common carotid artery is widely patent to the bifurcation. Calcified plaque at the bifurcation and ICA bulb but no stenosis or irregularity. Cervical ICA is tortuous but widely patent. Vertebral arteries: Right vertebral artery is dominant. Both vertebral artery origins are widely patent. Both vertebral arteries are widely patent through the cervical region to the foramen magnum. Skeleton: Ordinary spondylosis. Other neck: No mass or lymphadenopathy. Upper chest: Negative. Somewhat unusual collateral vein bulging into the suprasternal notch. Review of the MIP images confirms the above findings CTA HEAD FINDINGS Anterior circulation: Both internal carotid arteries are patent through the skull base and siphon regions. There is ordinary atherosclerotic calcification in the carotid siphons but no stenosis. The anterior and middle cerebral vessels are patent without proximal stenosis, aneurysm or vascular malformation. No missing vessels are identified. Posterior circulation: Both vertebral arteries are widely patent to the basilar. No basilar stenosis. Posterior circulation branch vessels are patent. Left PCA takes fetal origin from the anterior circulation. Venous sinuses: Patent and normal. Anatomic variants: None significant. Delayed phase: No abnormal enhancement. Review of the MIP images confirms the above findings CT Brain Perfusion Findings: CBF (<30%) Volume: 074mPerfusion (Tmax>6.0s) volume: 10m25mismatch Volume: 10mL44mfarction Location:None identified. IMPRESSION: Negative CT perfusion study. No  confirmatory evidence of right basal ganglia stroke. CT angiography does not show any stenotic disease or large or medium vessel occlusion. Ordinary mild atherosclerosis. Tortuous vessels suggesting a history of hypertension. These results were communicated to Dr. ArorRory Percy10:26 amon 7/17/2019by text page via the AM[facility]aging system. Electronically Signed   By: MarkNelson Chimes.   On: 04/16/2018 10:27   Dg Chest Port 1 View  Result Date: 04/16/2018 CLINICAL DATA:  Altered mental status. EXAM: PORTABLE CHEST 1 VIEW COMPARISON:  Chest x-ray dated March 27, 2018. FINDINGS: Stable cardiomegaly. Normal pulmonary vascularity. No focal consolidation, pleural effusion, or pneumothorax. No acute osseous abnormality. IMPRESSION: Stable cardiomegaly.  No active disease. Electronically Signed   By: WillTitus Dubin.   On: 04/16/2018 11:26   Ct Head Code Stroke Wo Contrast  Result Date: 04/16/2018 CLINICAL DATA:  Code stroke. Slurred speech. Altered level of consciousness. Last seen normal 2100 hours. EXAM: CT HEAD WITHOUT CONTRAST TECHNIQUE: Contiguous axial images were obtained from the base of the skull through the vertex without intravenous contrast. COMPARISON:  02/20/2018 FINDINGS: Brain: Mild generalized atrophy. Chronic small-vessel ischemic changes of the hemispheric white matter. Slightly more prominent focal low density in the right basal ganglia/anterior limb of the internal capsule which could be an acute infarction. No evidence of large vessel territory infarction. No mass lesion, hemorrhage, hydrocephalus or extra-axial collection. Vascular: There is atherosclerotic calcification of the major vessels at the base of the brain. Skull: Scattered lucencies within the calvarium consistent with the clinical history of myeloma. These are slightly progressive since the prior study. Sinuses/Orbits: Normal Other: None ASPECTS ([place] Program Early CT Score) -  Ganglionic level infarction (caudate,  lentiform nuclei, internal capsule, insula, M1-M3 cortex): 6 - Supraganglionic infarction (M4-M6 cortex): 3 Total score (0-10 with 10 being normal): 9 IMPRESSION: 1. Background pattern of atrophy and chronic small-vessel ischemic change. More prominent low-density in the basal ganglia/anterior limb internal capsule on the right suggesting an acute infarction in that location. 2. ASPECTS is 9. 3. Slightly progressive calvarial lucencies consistent with the clinical history of myeloma. 4. These results were communicated to Dr. Rory Percy at 9:43 amon 7/17/2019by text page via the [facility] messaging system. Electronically Signed   By: Nelson Chimes M.D.   On: 04/16/2018 09:45        Scheduled Meds: . calcium acetate  2,001 mg Oral TID WC  . Chlorhexidine Gluconate Cloth  6 each Topical Q0600  . famotidine  20 mg Oral Daily  . heparin  5,000 Units Subcutaneous Q8H  . isosorbide mononitrate  60 mg Oral Daily  . latanoprost  1 drop Both Eyes QHS  . losartan  50 mg Oral QHS  . multivitamin  1 tablet Oral QHS  . sertraline  50 mg Oral Daily  . sevelamer carbonate  800 mg Oral TID WC  . sodium chloride flush  3 mL Intravenous Q12H  . valACYclovir  500 mg Oral Q48H   Continuous Infusions: . sodium chloride       LOS: 1 day    Time spent:35 mins.     Shelly Coss, MD [facility] Pager [phone]  If 7PM-7AM, please contact night-coverage [email or web address] Password [email or web address] 04/17/2018, 9:35 AM

## 2018-04-17 NOTE — Evaluation (Signed)
SLP Cancellation Note  Patient Details Name: [name] MRN: [id number] DOB: 07-08-1941   Cancelled treatment:       Reason Eval/Treat Not Completed: Other (comment);Fatigue/lethargy limiting ability to participate  Note pt was agitated early this am and received ativan.   Will continue efforts.   [name] 04/17/2018, 7:27 AM

## 2018-04-17 NOTE — Progress Notes (Signed)
Pt has been making loud noises, crying with no tears, and being combative. Getting out of bed and very aggressive. Paged K. Schorr. See orders.

## 2018-04-17 NOTE — Consult Note (Signed)
[facility] Renal Consultation Note    Indication for Consultation:  Management of ESRD/hemodialysis; anemia, hypertension/volume and secondary hyperparathyroidism  EXH:BZJIRC, Darrick Penna, MD  HPI: [name] is a 77 y.o. female. ESRD 2/2 multiple myeloma on HD TTS at [facility], first starting in 07/2013.  Past medical history significant for MM dx in 2009, GERD, HTN, Hz depression, Hx pancytopenia, and Jehovah's Witness.     Seen and examined at bedside but unable to obtain history due to lethargy from ativan.  Patient arousable but goes right back to sleep.  Per chart review she presented to the ED yesterday with AMS and difficultly speaking and weakness, first noted earlier that day at the SNF.  CT and MRI completed yesterday ruled out acute stroke.  Other pertinent findings included severe hypertension.  Of note according the her OP dialysis unit record patient has not attended dialysis since Saturday 7/13 and she missed 7/11 as well.  Suprisingly labs are not far from her baseline.  Patient has been admitted for further evaluation and management.   Past Medical History:  Diagnosis Date  . Anxiety   . Closed fracture of right distal femur ([place]) 09/01/2015  . Depression   . ESRD (end stage renal disease) on dialysis [facility])    "TTS; [place] [facility]" (04/24/2017)  . GERD (gastroesophageal reflux disease) 04/23/2015  . HCAP (healthcare-associated pneumonia) 04/24/2017  . Heart murmur   . History of blood transfusion    "low HgB when I was going to dialysis center"  . Hypertension   . Hypertension associatd with end stage renal disease on dialysis 03/11/2014  . Malnutrition of moderate degree 09/02/2015  . Multiple myeloma ([place]) 03/11/2014  . Patient is Jehovah's Witness    "I'd rather never have any blood transfusions unless absolutely necessary; please check with me 1st". (04/24/2017)  . Pneumonia ~ 2016   Past Surgical History:  Procedure Laterality Date  . AV FISTULA PLACEMENT  Left   . I&D EXTREMITY Left 02/27/2016   Procedure: ARTHROSCOPIC IRRIGATION AND DEBRIDEMENT EXTREMITY;  Surgeon: Renette Butters, MD;  Location: [facility];  Service: Orthopedics;  Laterality: Left;  . TEE WITHOUT CARDIOVERSION N/A 02/29/2016   Procedure: TRANSESOPHAGEAL ECHOCARDIOGRAM (TEE);  Surgeon: Thayer Headings, MD;  Location: [facility] ENDOSCOPY;  Service: Cardiovascular;  Laterality: N/A;   Family History  Problem Relation Age of Onset  . Hypertension Mother   . Hypertension Father    Social History:  reports that she has never smoked. She has never used smokeless tobacco. She reports that she does not drink alcohol or use drugs. No Known Allergies Prior to Admission medications   Medication Sig Start Date End Date Taking? Authorizing Provider  amLODipine (NORVASC) 10 MG tablet Take 10 mg by mouth daily. Hold for SBP <100MM/HG   Yes [provider]  bisacodyl (BISAC-EVAC) 10 MG suppository Place 10 mg rectally once as needed for moderate constipation (FOR CONSTIPATION NOT RELIEVED BY MILK OF MAGNESIA).    Yes [provider]  calcium acetate (PHOSLO) 667 MG capsule Take 2,001 mg by mouth 3 (three) times daily.    Yes [provider]  cloNIDine (CATAPRES) 0.1 MG tablet Take 0.1 mg by mouth every 8 (eight) hours as needed (for a systolic B/P of 789 or greater).    Yes [provider]  hydrocortisone cream 1 % Apply 1 application topically every 8 (eight) hours as needed for itching. Apply to rectum for hemorrhoids   Yes [provider]  isosorbide mononitrate (  IMDUR) 60 MG 24 hr tablet Take 60 mg by mouth daily.  03/24/15  Yes [provider]  lamoTRIgine (LAMICTAL) 25 MG tablet Take 50 mg by mouth daily.    Yes [provider]  latanoprost (XALATAN) 0.005 % ophthalmic solution Place 1 drop into both eyes at bedtime.    Yes [provider]  loperamide (IMODIUM A-D) 2 MG tablet Take 2 mg by mouth as needed for diarrhea or loose  stools. (pre-dialysis days, per resident request)    Yes [provider]  LORazepam (ATIVAN) 0.5 MG tablet Take 1 tablet (0.5 mg total) by mouth at bedtime. 03/21/18  Yes Medina-Vargas, Monina C, NP  losartan (COZAAR) 50 MG tablet Take 50 mg by mouth at bedtime.   Yes [provider]  multivitamin (RENA-VIT) TABS tablet Take 1 tablet by mouth daily.   Yes [provider]  ondansetron (ZOFRAN) 4 MG tablet Take 4 mg by mouth every 8 (eight) hours as needed for nausea or vomiting.   Yes [provider]  promethazine (PHENERGAN) 25 MG tablet Take 25 mg by mouth every 6 (six) hours as needed for nausea or vomiting.   Yes [provider]  ranitidine (ZANTAC) 300 MG tablet Take 300 mg by mouth at bedtime. Reported on 03/05/2016   Yes [provider]  senna-docusate (SENEXON-S) 8.6-50 MG tablet Take 2 tablets by mouth at bedtime.   Yes [provider]  sertraline (ZOLOFT) 50 MG tablet Take 50 mg by mouth daily.    Yes [provider]  sevelamer carbonate (RENVELA) 800 MG tablet Take 800 mg by mouth 3 (three) times daily with meals.    Yes [provider]  simethicone (MYLICON) 401 MG chewable tablet Chew 125 mg by mouth 3 (three) times daily. Take 1 tablet by mouth prior to each meal at 8AM, 11AM, and 6PM   Yes [provider]  valACYclovir (VALTREX) 500 MG tablet Take 1 tablet (500 mg total) by mouth every other day. Patient taking differently: Take 500 mg by mouth every other day. (On even days) 03/01/16  Yes Hongalgi, Lenis Dickinson, MD   Current Facility-Administered Medications  Medication Dose Route Frequency Provider Last Rate Last Dose  . 0.9 %  sodium chloride infusion  250 mL Intravenous PRN Vita Erm, NP      . acetaminophen (TYLENOL) tablet 650 mg  650 mg Oral Q6H PRN Vita Erm, NP       Or  . acetaminophen (TYLENOL) suppository 650 mg  650 mg Rectal Q6H PRN Vita Erm, NP       . amLODipine (NORVASC) tablet 10 mg  10 mg Oral Daily Adhikari, Amrit, MD      . calcium acetate (PHOSLO) capsule 2,001 mg  2,001 mg Oral TID WC Vita Erm, NP      . Chlorhexidine Gluconate Cloth 2 % PADS 6 each  6 each Topical Q0600 Beretta Ginsberg, [place]      . cloNIDine (CATAPRES) tablet 0.1 mg  0.1 mg Oral Q8H PRN Adhikari, Amrit, MD      . famotidine (PEPCID) tablet 20 mg  20 mg Oral Daily Vita Erm, NP   20 mg at 04/16/18 1629  . heparin injection 5,000 Units  5,000 Units Subcutaneous Q8H Vita Erm, NP   5,000 Units at 04/17/18 0708  . hydrALAZINE (APRESOLINE) injection 5 mg  5 mg Intravenous Q4H PRN Karmen Bongo, MD   5 mg at 04/17/18 0232  . isosorbide  mononitrate (IMDUR) 24 hr tablet 60 mg  60 mg Oral Daily Vita Erm, NP   60 mg at 04/16/18 1634  . latanoprost (XALATAN) 0.005 % ophthalmic solution 1 drop  1 drop Both Eyes QHS Vita Erm, NP   1 drop at 04/16/18 2256  . loperamide (IMODIUM) capsule 4 mg  4 mg Oral PRN Vita Erm, NP      . LORazepam (ATIVAN) tablet 0.5 mg  0.5 mg Oral QHS Adhikari, Amrit, MD      . losartan (COZAAR) tablet 50 mg  50 mg Oral QHS Vita Erm, NP   50 mg at 04/16/18 2256  . multivitamin (RENA-VIT) tablet 1 tablet  1 tablet Oral QHS Vita Erm, NP   1 tablet at 04/16/18 2256  . ondansetron (ZOFRAN) tablet 4 mg  4 mg Oral Q6H PRN Vita Erm, NP       Or  . ondansetron [facility]) injection 4 mg  4 mg Intravenous Q6H PRN Vita Erm, NP      . sertraline (ZOLOFT) tablet 50 mg  50 mg Oral Daily Vita Erm, NP   50 mg at 04/16/18 2044  . sevelamer carbonate (RENVELA) tablet 800 mg  800 mg Oral TID WC Vita Erm, NP   800 mg at 04/16/18 2044  . sodium chloride flush (NS) 0.9 % injection 3 mL  3 mL Intravenous Q12H Vita Erm, NP   3 mL at 04/16/18 2307  . sodium chloride flush (NS) 0.9 %  injection 3 mL  3 mL Intravenous PRN Vita Erm, NP      . valACYclovir (VALTREX) tablet 500 mg  500 mg Oral Q48H Vita Erm, NP       Labs: Basic Metabolic Panel: Recent Labs  Lab 04/16/18 0929 04/16/18 0935 04/16/18 1443 04/17/18 0610  NA 134* 136  --  136  K 3.9 3.8  --  4.3  CL 95* 98  --  99  CO2 24  --   --  24  GLUCOSE 96 101*  --  85  BUN 51* 52*  --  59*  CREATININE 10.96* 12.60* 11.42* 12.59*  CALCIUM 9.9  --   --  9.5   Liver Function Tests: Recent Labs  Lab 04/16/18 0929  AST 22  ALT 12  ALKPHOS 42  BILITOT 0.8  PROT 9.5*  ALBUMIN 3.8   CBC: Recent Labs  Lab 04/16/18 0929 04/16/18 0935 04/16/18 1443 04/17/18 0610  WBC 3.1*  --  2.6* 2.7*  NEUTROABS 1.8  --   --   --   HGB 10.5* 11.9* 10.0* 9.9*  HCT 34.8* 35.0* 32.3* 31.2*  MCV 113.0*  --  111.4* 108.7*  PLT 88*  --  103* 99*   Cardiac Enzymes: Recent Labs  Lab 04/16/18 1443  TROPONINI 0.03*   Studies/Results: Ct Angio Head W Or Wo Contrast  Result Date: 04/16/2018 CLINICAL DATA:  Slurred speech. Altered level of consciousness. Last seen normal 2100 hours. Possible right basal ganglia infarction by CT. EXAM: CT ANGIOGRAPHY HEAD AND NECK CT PERFUSION BRAIN TECHNIQUE: Multidetector CT imaging of the head and neck was performed using the standard protocol during bolus administration of intravenous contrast. Multiplanar CT image reconstructions and MIPs were obtained to evaluate the vascular anatomy. Carotid stenosis measurements (when applicable) are obtained utilizing NASCET criteria, using the distal internal carotid diameter as the denominator. Multiphase CT imaging of the brain was performed following IV bolus contrast injection. Subsequent  parametric perfusion maps were calculated using RAPID software. CONTRAST:  71m ISOVUE-370 IOPAMIDOL (ISOVUE-370) INJECTION 76% COMPARISON:  CT same day. FINDINGS: CTA NECK FINDINGS Aortic arch: Aortic atherosclerosis. No aneurysm or  dissection. Branching pattern of the brachiocephalic vessels is normal without origin stenosis. Right carotid system: Common carotid artery is tortuous but widely patent to the bifurcation. There is minor plaque at the carotid bifurcation but no stenosis. Cervical ICA is tortuous but widely patent. Left carotid system: Common carotid artery is widely patent to the bifurcation. Calcified plaque at the bifurcation and ICA bulb but no stenosis or irregularity. Cervical ICA is tortuous but widely patent. Vertebral arteries: Right vertebral artery is dominant. Both vertebral artery origins are widely patent. Both vertebral arteries are widely patent through the cervical region to the foramen magnum. Skeleton: Ordinary spondylosis. Other neck: No mass or lymphadenopathy. Upper chest: Negative. Somewhat unusual collateral vein bulging into the suprasternal notch. Review of the MIP images confirms the above findings CTA HEAD FINDINGS Anterior circulation: Both internal carotid arteries are patent through the skull base and siphon regions. There is ordinary atherosclerotic calcification in the carotid siphons but no stenosis. The anterior and middle cerebral vessels are patent without proximal stenosis, aneurysm or vascular malformation. No missing vessels are identified. Posterior circulation: Both vertebral arteries are widely patent to the basilar. No basilar stenosis. Posterior circulation branch vessels are patent. Left PCA takes fetal origin from the anterior circulation. Venous sinuses: Patent and normal. Anatomic variants: None significant. Delayed phase: No abnormal enhancement. Review of the MIP images confirms the above findings CT Brain Perfusion Findings: CBF (<30%) Volume: 034mPerfusion (Tmax>6.0s) volume: 83m58mismatch Volume: 83mL32mfarction Location:None identified. IMPRESSION: Negative CT perfusion study. No confirmatory evidence of right basal ganglia stroke. CT angiography does not show any stenotic disease  or large or medium vessel occlusion. Ordinary mild atherosclerosis. Tortuous vessels suggesting a history of hypertension. These results were communicated to Dr. ArorRory Percy10:26 amon 7/17/2019by text page via the AMIOLour[facility] LLCsaging system. Electronically Signed   By: MarkNelson Chimes.   On: 04/16/2018 10:27   Ct Angio Neck W Or Wo Contrast  Result Date: 04/16/2018 CLINICAL DATA:  Slurred speech. Altered level of consciousness. Last seen normal 2100 hours. Possible right basal ganglia infarction by CT. EXAM: CT ANGIOGRAPHY HEAD AND NECK CT PERFUSION BRAIN TECHNIQUE: Multidetector CT imaging of the head and neck was performed using the standard protocol during bolus administration of intravenous contrast. Multiplanar CT image reconstructions and MIPs were obtained to evaluate the vascular anatomy. Carotid stenosis measurements (when applicable) are obtained utilizing NASCET criteria, using the distal internal carotid diameter as the denominator. Multiphase CT imaging of the brain was performed following IV bolus contrast injection. Subsequent parametric perfusion maps were calculated using RAPID software. CONTRAST:  95mL683mVUE-370 IOPAMIDOL (ISOVUE-370) INJECTION 76% COMPARISON:  CT same day. FINDINGS: CTA NECK FINDINGS Aortic arch: Aortic atherosclerosis. No aneurysm or dissection. Branching pattern of the brachiocephalic vessels is normal without origin stenosis. Right carotid system: Common carotid artery is tortuous but widely patent to the bifurcation. There is minor plaque at the carotid bifurcation but no stenosis. Cervical ICA is tortuous but widely patent. Left carotid system: Common carotid artery is widely patent to the bifurcation. Calcified plaque at the bifurcation and ICA bulb but no stenosis or irregularity. Cervical ICA is tortuous but widely patent. Vertebral arteries: Right vertebral artery is dominant. Both vertebral artery origins are widely patent. Both vertebral arteries are widely patent  through the cervical region  to the foramen magnum. Skeleton: Ordinary spondylosis. Other neck: No mass or lymphadenopathy. Upper chest: Negative. Somewhat unusual collateral vein bulging into the suprasternal notch. Review of the MIP images confirms the above findings CTA HEAD FINDINGS Anterior circulation: Both internal carotid arteries are patent through the skull base and siphon regions. There is ordinary atherosclerotic calcification in the carotid siphons but no stenosis. The anterior and middle cerebral vessels are patent without proximal stenosis, aneurysm or vascular malformation. No missing vessels are identified. Posterior circulation: Both vertebral arteries are widely patent to the basilar. No basilar stenosis. Posterior circulation branch vessels are patent. Left PCA takes fetal origin from the anterior circulation. Venous sinuses: Patent and normal. Anatomic variants: None significant. Delayed phase: No abnormal enhancement. Review of the MIP images confirms the above findings CT Brain Perfusion Findings: CBF (<30%) Volume: 52m Perfusion (Tmax>6.0s) volume: 023mMismatch Volume: 31m51mnfarction Location:None identified. IMPRESSION: Negative CT perfusion study. No confirmatory evidence of right basal ganglia stroke. CT angiography does not show any stenotic disease or large or medium vessel occlusion. Ordinary mild atherosclerosis. Tortuous vessels suggesting a history of hypertension. These results were communicated to Dr. AroRory Percy 10:26 amon 7/17/2019by text page via the AMILargo [facility] LPssaging system. Electronically Signed   By: MarNelson ChimesD.   On: 04/16/2018 10:27   Mr Brain Wo Contrast  Result Date: 04/16/2018 CLINICAL DATA:  Speech difficulty. Encephalopathy. Weakness. History of end-stage renal disease and multiple myeloma. EXAM: MRI HEAD WITHOUT CONTRAST TECHNIQUE: Multiplanar, multiecho pulse sequences of the brain and surrounding structures were obtained without intravenous contrast. COMPARISON:   Head CT 04/16/2018 FINDINGS: The examination had to be discontinued prior to completion due to patient's altered mental status. A complete noncontrast examination was obtained with the exception of sagittal and axial T1 weighted imaging. The study is motion degraded throughout, with moderate to severe motion on the axial FLAIR and coronal T2 sequences. Brain: There is no evidence of acute infarct, mass, midline shift, or extra-axial fluid collection. A single focus of chronic microhemorrhage is noted in the posteromedial right temporal lobe. There is mild cerebral atrophy. Patchy T2 hyperintensities are present in the cerebral white matter, nonspecific but compatible with mild chronic small vessel ischemic disease. Chronic lacunar infarcts are present in the left caudate nucleus, left thalamus, and left cerebellum. Vascular: Major intracranial vascular flow voids are preserved. Skull and upper cervical spine: Diffuse calvarial marrow heterogeneity which likely reflects patient's history of myeloma. Sinuses/Orbits: Unremarkable orbits. Paranasal sinuses and mastoid air cells are clear. Other: None. IMPRESSION: 1. Incomplete, motion degraded examination. 2. No acute infarct or other acute intracranial abnormality identified. 3. Mild chronic small vessel ischemic disease with chronic lacunar infarcts as above. Electronically Signed   By: AllLogan BoresD.   On: 04/16/2018 15:59   Ct Cerebral Perfusion W Contrast  Result Date: 04/16/2018 CLINICAL DATA:  Slurred speech. Altered level of consciousness. Last seen normal 2100 hours. Possible right basal ganglia infarction by CT. EXAM: CT ANGIOGRAPHY HEAD AND NECK CT PERFUSION BRAIN TECHNIQUE: Multidetector CT imaging of the head and neck was performed using the standard protocol during bolus administration of intravenous contrast. Multiplanar CT image reconstructions and MIPs were obtained to evaluate the vascular anatomy. Carotid stenosis measurements (when  applicable) are obtained utilizing NASCET criteria, using the distal internal carotid diameter as the denominator. Multiphase CT imaging of the brain was performed following IV bolus contrast injection. Subsequent parametric perfusion maps were calculated using RAPID software. CONTRAST:  24m35mOVUE-370 IOPAMIDOL (ISOVUE-370) INJECTION 76%  COMPARISON:  CT same day. FINDINGS: CTA NECK FINDINGS Aortic arch: Aortic atherosclerosis. No aneurysm or dissection. Branching pattern of the brachiocephalic vessels is normal without origin stenosis. Right carotid system: Common carotid artery is tortuous but widely patent to the bifurcation. There is minor plaque at the carotid bifurcation but no stenosis. Cervical ICA is tortuous but widely patent. Left carotid system: Common carotid artery is widely patent to the bifurcation. Calcified plaque at the bifurcation and ICA bulb but no stenosis or irregularity. Cervical ICA is tortuous but widely patent. Vertebral arteries: Right vertebral artery is dominant. Both vertebral artery origins are widely patent. Both vertebral arteries are widely patent through the cervical region to the foramen magnum. Skeleton: Ordinary spondylosis. Other neck: No mass or lymphadenopathy. Upper chest: Negative. Somewhat unusual collateral vein bulging into the suprasternal notch. Review of the MIP images confirms the above findings CTA HEAD FINDINGS Anterior circulation: Both internal carotid arteries are patent through the skull base and siphon regions. There is ordinary atherosclerotic calcification in the carotid siphons but no stenosis. The anterior and middle cerebral vessels are patent without proximal stenosis, aneurysm or vascular malformation. No missing vessels are identified. Posterior circulation: Both vertebral arteries are widely patent to the basilar. No basilar stenosis. Posterior circulation branch vessels are patent. Left PCA takes fetal origin from the anterior circulation. Venous  sinuses: Patent and normal. Anatomic variants: None significant. Delayed phase: No abnormal enhancement. Review of the MIP images confirms the above findings CT Brain Perfusion Findings: CBF (<30%) Volume: 56m Perfusion (Tmax>6.0s) volume: 045mMismatch Volume: 34m67mnfarction Location:None identified. IMPRESSION: Negative CT perfusion study. No confirmatory evidence of right basal ganglia stroke. CT angiography does not show any stenotic disease or large or medium vessel occlusion. Ordinary mild atherosclerosis. Tortuous vessels suggesting a history of hypertension. These results were communicated to Dr. AroRory Percy 10:26 amon 7/17/2019by text page via the AMIEvergreen Health Monroessaging system. Electronically Signed   By: MarNelson ChimesD.   On: 04/16/2018 10:27   Dg Chest Port 1 View  Result Date: 04/16/2018 CLINICAL DATA:  Altered mental status. EXAM: PORTABLE CHEST 1 VIEW COMPARISON:  Chest x-ray dated March 27, 2018. FINDINGS: Stable cardiomegaly. Normal pulmonary vascularity. No focal consolidation, pleural effusion, or pneumothorax. No acute osseous abnormality. IMPRESSION: Stable cardiomegaly.  No active disease. Electronically Signed   By: WilTitus DubinD.   On: 04/16/2018 11:26   Ct Head Code Stroke Wo Contrast  Result Date: 04/16/2018 CLINICAL DATA:  Code stroke. Slurred speech. Altered level of consciousness. Last seen normal 2100 hours. EXAM: CT HEAD WITHOUT CONTRAST TECHNIQUE: Contiguous axial images were obtained from the base of the skull through the vertex without intravenous contrast. COMPARISON:  02/20/2018 FINDINGS: Brain: Mild generalized atrophy. Chronic small-vessel ischemic changes of the hemispheric white matter. Slightly more prominent focal low density in the right basal ganglia/anterior limb of the internal capsule which could be an acute infarction. No evidence of large vessel territory infarction. No mass lesion, hemorrhage, hydrocephalus or extra-axial collection. Vascular: There is  atherosclerotic calcification of the major vessels at the base of the brain. Skull: Scattered lucencies within the calvarium consistent with the clinical history of myeloma. These are slightly progressive since the prior study. Sinuses/Orbits: Normal Other: None ASPECTS ([place] Program Early CT Score) - Ganglionic level infarction (caudate, lentiform nuclei, internal capsule, insula, M1-M3 cortex): 6 - Supraganglionic infarction (M4-M6 cortex): 3 Total score (0-10 with 10 being normal): 9 IMPRESSION: 1. Background pattern of atrophy and chronic small-vessel ischemic change. More  prominent low-density in the basal ganglia/anterior limb internal capsule on the right suggesting an acute infarction in that location. 2. ASPECTS is 9. 3. Slightly progressive calvarial lucencies consistent with the clinical history of myeloma. 4. These results were communicated to Dr. Rory Percy at 9:43 amon 7/17/2019by text page via the [facility] messaging system. Electronically Signed   By: Nelson Chimes M.D.   On: 04/16/2018 09:45    ROS: Unable to obtain ROS due to current mental status.    Physical Exam: Vitals:   04/17/18 0219 04/17/18 0253 04/17/18 0318 04/17/18 0743  BP: (!) 198/118 (!) 173/98 (!) 190/102 (!) 181/105  Pulse: 97 85 83 69  Resp: 20   19  Temp: (!) 97.5 F (36.4 C)  98.4 F (36.9 C) 97.9 F (36.6 C)  TempSrc: Axillary  Oral Axillary  SpO2: 93%  95% 97%     General: NAD, chronically ill appearing female Head: NCAT, MMM Neck: Supple. No lymphadenopathy Lungs: CTAB anteriorly. No wheeze, rales or rhonchi. Breathing is unlabored. Heart: RRR. No murmur, rubs or gallops appreciated.  Abdomen: soft, +BS, no guarding, no rebound tenderness  Lower extremities:no edema, ischemic changes, or open wounds  Neuro: AAOx3. Moves all extremities spontaneously. Psych:  Responds to questions appropriately with a normal affect. Dialysis Access:LU AVF +b/t  Dialysis Orders:  TTS - [facility]  3.5hrs, BFR 425, DFR  AF1.5,  EDW 64kg, 2K/ 2.25Ca  Access: LU AVF  Heparin None EPO 19000 Units IV qHD Hectorol 55mg IV qHD     Assessment/Plan: 1.  AMS - etiology unclear. CT/MRI negative for acute process.  HD planned for today, reassess post and see if improvement noted. Per primary.  2.  ESRD -  TTS patient, non compliant with dialysis regimen, last dialysis 4 days ago.  Labs look suprisingly close to BL, K 4.3, BUN 59. Orders written for HD today.  3.  Hypertension/volume  - BP elevated. Does not appear grossly volume overloaded on exam. CXR showed no pulmonary edema.  Likely increased volume d/t dialysis t 4 days.  Will titrate down volume as tolerated. 4.  Anemia of CKD - Hgb 9.9.  Continue retacrit qHD.  5.  Secondary Hyperparathyroidism -  Ca in goal. Will check phos.  Continue VDRA and binders.  6.  Nutrition - Renal diet w/fluid restrictions. Renavite. 7. Multiple myeloma 8. Hx pancytopenia 9. GERD  LJen Mow PA-C [facility] Pager: [phone]/18/2019, 10:49 AM

## 2018-04-17 NOTE — Progress Notes (Signed)
RN attempted to perform the swallow screening. PT is lethargic and now following command. RN provided mouth care and pt went right back to sleep.

## 2018-04-18 ENCOUNTER — Inpatient Hospital Stay (HOSPITAL_COMMUNITY): Payer: Medicare Other

## 2018-04-18 LAB — BASIC METABOLIC PANEL
Anion gap: 11 (ref 5–15)
BUN: 23 mg/dL (ref 8–23)
CALCIUM: 8.9 mg/dL (ref 8.9–10.3)
CO2: 28 mmol/L (ref 22–32)
CREATININE: 7.64 mg/dL — AB (ref 0.44–1.00)
Chloride: 96 mmol/L — ABNORMAL LOW (ref 98–111)
GFR calc Af Amer: 5 mL/min — ABNORMAL LOW (ref >60)
GFR, EST NON AFRICAN AMERICAN: 4 mL/min — AB (ref >60)
GLUCOSE: 57 mg/dL — AB (ref 70–99)
Potassium: 3.9 mmol/L (ref 3.5–5.1)
Sodium: 135 mmol/L (ref 135–145)

## 2018-04-18 LAB — CBC WITH DIFFERENTIAL/PLATELET
Basophils Absolute: 0 10*3/uL (ref 0.0–0.1)
Basophils Relative: 1 %
EOS PCT: 9 %
Eosinophils Absolute: 0.2 10*3/uL (ref 0.0–0.7)
HCT: 30.1 % — ABNORMAL LOW (ref 36.0–46.0)
Hemoglobin: 9.3 g/dL — ABNORMAL LOW (ref 12.0–15.0)
LYMPHS ABS: 0.4 10*3/uL — AB (ref 0.7–4.0)
Lymphocytes Relative: 17 %
MCH: 34.4 pg — AB (ref 26.0–34.0)
MCHC: 30.9 g/dL (ref 30.0–36.0)
MCV: 111.5 fL — ABNORMAL HIGH (ref 78.0–100.0)
Monocytes Absolute: 0.3 10*3/uL (ref 0.1–1.0)
Monocytes Relative: 13 %
NEUTROS ABS: 1.7 10*3/uL (ref 1.7–7.7)
Neutrophils Relative %: 60 %
PLATELETS: 123 10*3/uL — AB (ref 150–400)
RBC: 2.7 MIL/uL — ABNORMAL LOW (ref 3.87–5.11)
RDW: 14.9 % (ref 11.5–15.5)
WBC: 2.6 10*3/uL — ABNORMAL LOW (ref 4.0–10.5)

## 2018-04-18 LAB — URINE CULTURE: Culture: >=100000 — AB

## 2018-04-18 LAB — GLUCOSE, CAPILLARY
GLUCOSE-CAPILLARY: 49 mg/dL — AB (ref 70–99)
Glucose-Capillary: 85 mg/dL (ref 70–99)
Glucose-Capillary: 130 mg/dL — ABNORMAL HIGH (ref 70–99)

## 2018-04-18 NOTE — Social Work (Signed)
Clinical Social Worker facilitated patient discharge including contacting patient family and facility to confirm patient discharge plans.  Clinical information faxed to facility and family agreeable with plan.  CSW arranged ambulance transport via [facility] to [place].  RN to call [phone] with report  prior to discharge.  Clinical Social Worker will sign off for now as social work intervention is no longer needed. Please consult [place] again if new need arises.  Alexander Mt, [place] Social Worker [phone]

## 2018-04-18 NOTE — NC FL2 (Signed)
[place] MEDICAID FL2 LEVEL OF CARE SCREENING TOOL     IDENTIFICATION  Patient Name: [name] Birthdate: April 22, 1941 Sex: female Admission Date (Current Location): 04/16/2018  [place] and [place] Number:  [name] [id number] [place] and Address:  [facility], [place] [address] [place], [place] [address]      Provider Number: [id number]  Attending Physician Name and Address:  Shelly Coss, MD  Relative Name and Phone Number:  [name] [phone], daughter    Current Level of Care: Hospital Recommended Level of Care: [place] Prior Approval Number:    Date Approved/Denied:   PASRR Number: [id number]  Discharge Plan: SNF    Current Diagnoses: Patient Active Problem List   Diagnosis Date Noted  . Hypertension, uncontrolled 04/16/2018  . ESRD (end stage renal disease) on dialysis ([place]) 04/16/2018  . Encephalopathy acute 04/16/2018  . Anxiety and depression 04/16/2018  . Hypertension, accelerated 04/16/2018  . Pressure ulcer, stage I 04/16/2018  . Abnormal chest x-ray 12/26/2017  . HCAP (healthcare-associated pneumonia) 04/24/2017  . Sepsis ([place]) 04/24/2017  . Nausea and vomiting 04/24/2017  . Thrombocytopenia ([place]) 04/24/2017  . Hyperkalemia 01/16/2017  . IBS (irritable bowel syndrome) 12/27/2016  . Mood disorder ([place]) 07/19/2016  . Diastolic dysfunction 14/97/0263  . Insomnia 04/27/2016  . Hyponatremia 03/12/2016  . Symptomatic anemia 03/12/2016  . Diarrhea 03/12/2016  . Essential hypertension   . Swelling of joint of left knee 02/18/2016  . Leukopenia due to antineoplastic chemotherapy ([place]) 09/03/2015  . Malnutrition of moderate degree 09/02/2015  . GERD (gastroesophageal reflux disease) 04/23/2015  . 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 Multiple myeloma ([place]) 03/11/2014  . Hypertension associatd with end stage renal disease on dialysis 03/11/2014    Orientation RESPIRATION BLADDER Height & Weight      Self, Place  Normal Incontinent Weight: 138 lb 3.7 oz (62.7 kg) Height:     BEHAVIORAL SYMPTOMS/MOOD NEUROLOGICAL BOWEL NUTRITION STATUS      Incontinent Diet(see discharge summary)  AMBULATORY STATUS COMMUNICATION OF NEEDS Skin   Limited Assist Verbally PU Stage and Appropriate Care   PU Stage 2 Dressing: (on buttocks; MASD also present; foam dressing)                   Personal Care Assistance Level of Assistance  Bathing, Feeding, Dressing Bathing Assistance: Limited assistance Feeding assistance: Independent Dressing Assistance: Limited assistance     Functional Limitations Info  Sight, Hearing, Speech Sight Info: Adequate Hearing Info: Adequate Speech Info: Adequate    SPECIAL CARE FACTORS FREQUENCY  Speech therapy             Speech Therapy Frequency: 2 x week      Contractures Contractures Info: Not present    Additional Factors Info  Code Status, Allergies, Psychotropic Code Status Info: Full Code Allergies Info: No Known Allergies Psychotropic Info: LORazepam (ATIVAN) tablet 0.5 mg po daily at bedtime; sertraline (ZOLOFT) tablet 50 mg daily po         Current Medications (04/18/2018):  This is the current hospital active medication list Current Facility-Administered Medications  Medication Dose Route Frequency Provider Last Rate Last Dose  . 0.9 %  sodium chloride infusion  250 mL Intravenous PRN Vita Erm, NP      . acetaminophen (TYLENOL) tablet 650 mg  650 mg Oral Q6H PRN Vita Erm, NP       Or  . acetaminophen (TYLENOL) suppository 650 mg  650 mg Rectal Q6H PRN Tamala Julian,  Elmo Putt, NP      . amLODipine (NORVASC) tablet 10 mg  10 mg Oral Daily Shelly Coss, MD   10 mg at 04/18/18 1154  . calcium acetate (PHOSLO) capsule 2,001 mg  2,001 mg Oral TID WC Vita Erm, NP   2,001 mg at 04/18/18 1155  . Chlorhexidine Gluconate Cloth 2 % PADS 6 each  6 each Topical Q0600 Penninger, [place], [place]   6 each at  04/18/18 8250  . cloNIDine (CATAPRES) tablet 0.1 mg  0.1 mg Oral Q8H PRN Shelly Coss, MD      . Darbepoetin Alfa (ARANESP) injection 100 mcg  100 mcg Intravenous Q Thu-HD Shelly Coss, MD   100 mcg at 04/17/18 1513  . doxercalciferol (HECTOROL) injection 4 mcg  4 mcg Intravenous Q T,Th,Sa-HD Penninger, [place], PA   4 mcg at 04/17/18 1514  . famotidine (PEPCID) tablet 20 mg  20 mg Oral Daily Vita Erm, NP   20 mg at 04/18/18 1155  . heparin injection 5,000 Units  5,000 Units Subcutaneous Q8H Vita Erm, NP   5,000 Units at 04/18/18 0370  . hydrALAZINE (APRESOLINE) injection 5 mg  5 mg Intravenous Q4H PRN Karmen Bongo, MD   5 mg at 04/18/18 0412  . isosorbide mononitrate (IMDUR) 24 hr tablet 60 mg  60 mg Oral Daily Vita Erm, NP   60 mg at 04/18/18 1155  . latanoprost (XALATAN) 0.005 % ophthalmic solution 1 drop  1 drop Both Eyes QHS Vita Erm, NP   1 drop at 04/17/18 2249  . loperamide (IMODIUM) capsule 4 mg  4 mg Oral PRN Vita Erm, NP      . LORazepam (ATIVAN) tablet 0.5 mg  0.5 mg Oral QHS Shelly Coss, MD   0.5 mg at 04/17/18 2247  . losartan (COZAAR) tablet 50 mg  50 mg Oral QHS Vita Erm, NP   50 mg at 04/17/18 2247  . multivitamin (RENA-VIT) tablet 1 tablet  1 tablet Oral QHS Vita Erm, NP   1 tablet at 04/17/18 2247  . ondansetron (ZOFRAN) tablet 4 mg  4 mg Oral Q6H PRN Vita Erm, NP       Or  . ondansetron [facility]) injection 4 mg  4 mg Intravenous Q6H PRN Vita Erm, NP      . sertraline (ZOLOFT) tablet 50 mg  50 mg Oral Daily Vita Erm, NP   50 mg at 04/18/18 1153  . sevelamer carbonate (RENVELA) tablet 800 mg  800 mg Oral TID WC Vita Erm, NP   800 mg at 04/18/18 1154  . sodium chloride flush (NS) 0.9 % injection 3 mL  3 mL Intravenous Q12H Vita Erm, NP   3 mL at 04/18/18 1201  . sodium chloride flush (NS) 0.9  % injection 3 mL  3 mL Intravenous PRN Vita Erm, NP      . valACYclovir Estell Harpin) tablet 500 mg  500 mg Oral Q48H Vita Erm, NP         Discharge Medications: Please see discharge summary for a list of discharge medications.  Relevant Imaging Results:  Relevant Lab Results:   Additional Information [id number] [place], [place]

## 2018-04-18 NOTE — Social Work (Addendum)
CSW attempted call to pt daughter [name] to discuss discharge, no answer, HIPPA compliant message left for pt daughter.  Continuing to follow to support discharge back to [facility] today.  4:20pm- Still have not received return phone call from pt daughter. Pt is [facility] at [facility] will proceed with discharge.   Alexander Mt, [place] Work [phone]

## 2018-04-18 NOTE — Evaluation (Signed)
Clinical/Bedside Swallow Evaluation Patient Details  Name: [name] MRN: [id number] Date of Birth: 08-26-1941  Today's Date: 04/18/2018 Time: SLP Start Time (ACUTE ONLY): 0815 SLP Stop Time (ACUTE ONLY): 0845 SLP Time Calculation (min) (ACUTE ONLY): 30 min  Past Medical History:  Past Medical History:  Diagnosis Date  . Anxiety   . Closed fracture of right distal femur ([place]) 09/01/2015  . Depression   . ESRD (end stage renal disease) on dialysis [facility])    "TTS; [facility]" (04/24/2017)  . GERD (gastroesophageal reflux disease) 04/23/2015  . HCAP (healthcare-associated pneumonia) 04/24/2017  . Heart murmur   . History of blood transfusion    "low HgB when I was going to dialysis center"  . Hypertension   . Hypertension associatd with end stage renal disease on dialysis 03/11/2014  . Malnutrition of moderate degree 09/02/2015  . Multiple myeloma ([place]) 03/11/2014  . Patient is Jehovah's Witness    "I'd rather never have any blood transfusions unless absolutely necessary; please check with me 1st". (04/24/2017)  . Pneumonia ~ 2016   Past Surgical History:  Past Surgical History:  Procedure Laterality Date  . AV FISTULA PLACEMENT Left   . I&D EXTREMITY Left 02/27/2016   Procedure: ARTHROSCOPIC IRRIGATION AND DEBRIDEMENT EXTREMITY;  Surgeon: Renette Butters, MD;  Location: [facility];  Service: Orthopedics;  Laterality: Left;  . TEE WITHOUT CARDIOVERSION N/A 02/29/2016   Procedure: TRANSESOPHAGEAL ECHOCARDIOGRAM (TEE);  Surgeon: Thayer Headings, MD;  Location[facility] ENDOSCOPY;  Service: Cardiovascular;  Laterality: N/A;   HPI:  77 yo female adm to [facility] with AMS, concern for CVA- MRI showed mild chronic small vessel diseae, otherwise negative.  CXR negative 04/16/18.  Pt PMH + for ESRD on dialysis, multiple myeloma.     Assessment / Plan / Recommendation Clinical Impression  Pt presenting with clinical indications/concerns for pharyngeal deficits and possible airway penetration/aspiration.  She  demonstrates throat clearing across all consistencies tested.  Question impact of possible swelling submental on her swallow.  Pt with negative CXR and brain MRI but she admits to frequent "colds" and since she is undergoing treatment for multiple myeloma - recommend pursue MBS to assess pharyngeal=cervical esophageal swallow.  Recommend pt be able to continue to eat pending testing.  SLP coordinated with Ultrasound and Fl[place] with potential plans for pt to have MBS after ultrasound.  Pt, RN, [place] and flouro aware and agreeable.   Thanks.  SLP Visit Diagnosis: Dysphagia, pharyngoesophageal phase (R13.14)    Aspiration Risk  Moderate aspiration risk    Diet Recommendation Regular;Thin liquid   Liquid Administration via: Cup;Straw Medication Administration: Whole meds with liquid Supervision: Patient able to self feed Compensations: Minimize environmental distractions;Slow rate;Small sips/bites Postural Changes: Seated upright at 90 degrees;Remain upright for at least 30 minutes after po intake    Other  Recommendations Oral Care Recommendations: Oral care BID   Follow up Recommendations   tbd     Frequency and Duration   tbd         Prognosis Prognosis for Safe Diet Advancement: Good      Swallow Study   General Date of Onset: 04/18/18 HPI: 77 yo female adm to [facility] with AMS, concern for CVA- MRI showed mild chronic small vessel diseae, otherwise negative.  CXR negative 04/16/18.  Pt PMH + for ESRD on dialysis, multiple myeloma.   Type of Study: Bedside Swallow Evaluation Diet Prior to this Study: Regular;Thin liquids Temperature Spikes Noted: No Respiratory Status: Room air History of Recent Intubation: No  Behavior/Cognition: Alert;Pleasant mood;Cooperative Oral Cavity Assessment: Within Functional Limits Oral Cavity - Dentition: Dentures, top;Other (Comment)(lower dentures missing) Vision: Functional for self-feeding Self-Feeding Abilities: Able to feed self Patient Positioning:  Upright in bed Baseline Vocal Quality: Normal Volitional Cough: Strong Volitional Swallow: Able to elicit    Oral/Motor/Sensory Function Overall Oral Motor/Sensory Function: Within functional limits   Ice Chips Ice chips: Not tested   Thin Liquid Thin Liquid: Impaired Presentation: Cup;Self Fed Pharyngeal  Phase Impairments: Throat Clearing - Immediate    Nectar Thick Nectar Thick Liquid: Not tested   Honey Thick Honey Thick Liquid: Not tested   Puree Puree: Not tested   Solid   GO   Solid: Impaired Presentation: Self Fed;Spoon Oral Phase Impairments: Reduced lingual movement/coordination;Impaired mastication Oral Phase Functional Implications: Left lateral sulci pocketing;Right lateral sulci pocketing;Impaired mastication Pharyngeal Phase Impairments: Cough - Delayed        [name] 04/18/2018,9:15 AM   Luanna Salk, [place] [facility] SLP [phone]

## 2018-04-18 NOTE — Progress Notes (Signed)
Noticed lab results had low glucose level, got CBG of 49- patient swallowed 240 cc orange juice and a vanilla pudding. Will recheck after 15 minutes of completion

## 2018-04-18 NOTE — Discharge Summary (Addendum)
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  Patient presented with slurred speech, left deviated gaze as well as left facial droop.  Neurology was consulted on presentation.  Stroke was suspected but MRI did not show any acute intracranial abnormalities.  Altered mental status has been thought secondary to toxic metabolic encephalopathy secondary to uremia.  Nephrology was following.  She  underwent  Dialysis with significant improvement in her mental status.  Currently she is alert and oriented. This morning she was hemodynamically stable.  There is no plan for further dialysis today.  She is stable for discharge back to [facility] and rehabilitation today.  Following problems were addressed during her hospitalization:  Acute encephalopathy: Most likely  toxic metabolic encephalopathy secondary to uremia .  She was also noted to be hypertensive on presentation.  Stroke was suspected on presentation.  MRI of the brain did not show any acute intracranial abnormalities and CT imaging did not show any obstruction or stenosis in the vessels.   She does not have any clinical signs of infection.    Altered mental status resolved completely after dialysis and she is currently on her baseline.  ESRD on dialysis: Dialyzed on TTS.  Nephrology was following and she was dialyzed here.  Hypertension: Systolic blood pressure recorded in the range of 200s   on presentation.  Blood pressure has improved today. Continue home meds.  History of multiple myeloma: Currently on weekly chemotherapy as per the report.  Follow-up with oncology as an outpatient at [facility].  On valacyclovir every other day.  Leukopenia/thrombocytopenia: Chronic.  Secondary to multiple myeloma/chemotherapy.    History of anxiety/depression: On Lamictal/Zoloft  at the facilty.   History of coronary disease: On Imdur.  No complaints of chest pain.  Stage I pressure ulcer: Continue supportive care.  Dysphagia: Speech therapy evaluated the patient and she underwent modified barium swallow.Recommended Dysphagia 3 diet(mechanical soft ). There was concern of swelling of her neck .  Ultrasound of the neck did not show anything significant.  Discharge Diagnoses:  Principal Problem:   Encephalopathy acute Active Problems:   Multiple myeloma (HCC)   Anemia in neoplastic disease   GERD (gastroesophageal reflux disease)   Leukopenia due to antineoplastic chemotherapy (HCC)   ESRD (end stage renal disease) on dialysis (HCC)   Anxiety and depression   Hypertension, accelerated   Pressure ulcer, stage I    Discharge Instructions  Discharge Instructions    Diet - low sodium heart healthy   Complete by:  As directed    Discharge instructions   Complete by:  As directed    1) Follow up with your PCP in a week. 2) Undergo dialysis as scheduled.   Increase activity slowly   Complete by:  As directed      Allergies as of 04/18/2018   No Known Allergies     Medication List    TAKE these medications   amLODipine 10 MG tablet Commonly known as:  NORVASC Take 10 mg by mouth daily. Hold for SBP <100MM/HG   BISAC-EVAC 10 MG suppository Generic  drug:  bisacodyl Place 10 mg rectally once as needed for moderate constipation (FOR CONSTIPATION NOT RELIEVED BY MILK OF MAGNESIA).   calcium acetate 667 MG capsule Commonly known as:  PHOSLO Take 2,001 mg by mouth 3  (three) times daily.   cloNIDine 0.1 MG tablet Commonly known as:  CATAPRES Take 0.1 mg by mouth every 8 (eight) hours as needed (for a systolic B/P of 185 or greater).   hydrocortisone cream 1 % Apply 1 application topically every 8 (eight) hours as needed for itching. Apply to rectum for hemorrhoids   isosorbide mononitrate 60 MG 24 hr tablet Commonly known as:  IMDUR Take 60 mg by mouth daily.   lamoTRIgine 25 MG tablet Commonly known as:  LAMICTAL Take 50 mg by mouth daily.   latanoprost 0.005 % ophthalmic solution Commonly known as:  XALATAN Place 1 drop into both eyes at bedtime.   loperamide 2 MG tablet Commonly known as:  IMODIUM A-D Take 2 mg by mouth as needed for diarrhea or loose stools. (pre-dialysis days, per resident request)   LORazepam 0.5 MG tablet Commonly known as:  ATIVAN Take 1 tablet (0.5 mg total) by mouth at bedtime.   losartan 50 MG tablet Commonly known as:  COZAAR Take 50 mg by mouth at bedtime.   multivitamin Tabs tablet Take 1 tablet by mouth daily.   ondansetron 4 MG tablet Commonly known as:  ZOFRAN Take 4 mg by mouth every 8 (eight) hours as needed for nausea or vomiting.   promethazine 25 MG tablet Commonly known as:  PHENERGAN Take 25 mg by mouth every 6 (six) hours as needed for nausea or vomiting.   ranitidine 300 MG tablet Commonly known as:  ZANTAC Take 300 mg by mouth at bedtime. Reported on 03/05/2016   SENEXON-S 8.6-50 MG tablet Generic drug:  senna-docusate Take 2 tablets by mouth at bedtime.   sertraline 50 MG tablet Commonly known as:  ZOLOFT Take 50 mg by mouth daily.   sevelamer carbonate 800 MG tablet Commonly known as:  RENVELA Take 800 mg by mouth 3 (three) times daily with meals.   simethicone 125 MG chewable tablet Commonly known as:  MYLICON Chew 631 mg by mouth 3 (three) times daily. Take 1 tablet by mouth prior to each meal at 8AM, 11AM, and 6PM   valACYclovir 500 MG tablet Commonly known as:   VALTREX Take 1 tablet (500 mg total) by mouth every other day. What changed:  additional instructions      Follow-up Information    Hendricks Limes, MD. Schedule an appointment as soon as possible for a visit in 1 week(s).   Specialty:  Internal Medicine Contact information: [place] [id number] [phone]          No Known Allergies  Consultations:  Nephrology   Procedures/Studies: Ct Angio Head W Or Wo Contrast  Result Date: 04/16/2018 CLINICAL DATA:  Slurred speech. Altered level of consciousness. Last seen normal 2100 hours. Possible right basal ganglia infarction by CT. EXAM: CT ANGIOGRAPHY HEAD AND NECK CT PERFUSION BRAIN TECHNIQUE: Multidetector CT imaging of the head and neck was performed using the standard protocol during bolus administration of intravenous contrast. Multiplanar CT image reconstructions and MIPs were obtained to evaluate the vascular anatomy. Carotid stenosis measurements (when applicable) are obtained utilizing NASCET criteria, using the distal internal carotid diameter as the denominator. Multiphase CT imaging of the brain was performed following IV bolus contrast injection. Subsequent parametric perfusion maps were calculated  using RAPID software. CONTRAST:  44m ISOVUE-370 IOPAMIDOL (ISOVUE-370) INJECTION 76% COMPARISON:  CT same day. FINDINGS: CTA NECK FINDINGS Aortic arch: Aortic atherosclerosis. No aneurysm or dissection. Branching pattern of the brachiocephalic vessels is normal without origin stenosis. Right carotid system: Common carotid artery is tortuous but widely patent to the bifurcation. There is minor plaque at the carotid bifurcation but no stenosis. Cervical ICA is tortuous but widely patent. Left carotid system: Common carotid artery is widely patent to the bifurcation. Calcified plaque at the bifurcation and ICA bulb but no stenosis or irregularity. Cervical ICA is tortuous but widely patent. Vertebral arteries: Right  vertebral artery is dominant. Both vertebral artery origins are widely patent. Both vertebral arteries are widely patent through the cervical region to the foramen magnum. Skeleton: Ordinary spondylosis. Other neck: No mass or lymphadenopathy. Upper chest: Negative. Somewhat unusual collateral vein bulging into the suprasternal notch. Review of the MIP images confirms the above findings CTA HEAD FINDINGS Anterior circulation: Both internal carotid arteries are patent through the skull base and siphon regions. There is ordinary atherosclerotic calcification in the carotid siphons but no stenosis. The anterior and middle cerebral vessels are patent without proximal stenosis, aneurysm or vascular malformation. No missing vessels are identified. Posterior circulation: Both vertebral arteries are widely patent to the basilar. No basilar stenosis. Posterior circulation branch vessels are patent. Left PCA takes fetal origin from the anterior circulation. Venous sinuses: Patent and normal. Anatomic variants: None significant. Delayed phase: No abnormal enhancement. Review of the MIP images confirms the above findings CT Brain Perfusion Findings: CBF (<30%) Volume: 022mPerfusion (Tmax>6.0s) volume: 73m28mismatch Volume: 73mL41mfarction Location:None identified. IMPRESSION: Negative CT perfusion study. No confirmatory evidence of right basal ganglia stroke. CT angiography does not show any stenotic disease or large or medium vessel occlusion. Ordinary mild atherosclerosis. Tortuous vessels suggesting a history of hypertension. These results were communicated to Dr. ArorRory Percy10:26 amon 7/17/2019by text page via the AMIO[facility] Orangesaging system. Electronically Signed   By: MarkNelson Chimes.   On: 04/16/2018 10:27   Dg Chest 2 View  Result Date: 03/27/2018 CLINICAL DATA:  Cough EXAM: CHEST - 2 VIEW COMPARISON:  06/26/2017 FINDINGS: The lungs are well inflated. There is moderate cardiomegaly. Bibasilar atelectasis. No pulmonary edema  or focal consolidation. There is no pleural effusion or pneumothorax. IMPRESSION: Moderate cardiomegaly and bibasilar atelectasis. Electronically Signed   By: KeviUlyses Jarred.   On: 03/27/2018 21:58   Ct Angio Neck W Or Wo Contrast  Result Date: 04/16/2018 CLINICAL DATA:  Slurred speech. Altered level of consciousness. Last seen normal 2100 hours. Possible right basal ganglia infarction by CT. EXAM: CT ANGIOGRAPHY HEAD AND NECK CT PERFUSION BRAIN TECHNIQUE: Multidetector CT imaging of the head and neck was performed using the standard protocol during bolus administration of intravenous contrast. Multiplanar CT image reconstructions and MIPs were obtained to evaluate the vascular anatomy. Carotid stenosis measurements (when applicable) are obtained utilizing NASCET criteria, using the distal internal carotid diameter as the denominator. Multiphase CT imaging of the brain was performed following IV bolus contrast injection. Subsequent parametric perfusion maps were calculated using RAPID software. CONTRAST:  95mL59mVUE-370 IOPAMIDOL (ISOVUE-370) INJECTION 76% COMPARISON:  CT same day. FINDINGS: CTA NECK FINDINGS Aortic arch: Aortic atherosclerosis. No aneurysm or dissection. Branching pattern of the brachiocephalic vessels is normal without origin stenosis. Right carotid system: Common carotid artery is tortuous but widely patent to the bifurcation. There is minor plaque at the carotid bifurcation but no stenosis. Cervical ICA  is tortuous but widely patent. Left carotid system: Common carotid artery is widely patent to the bifurcation. Calcified plaque at the bifurcation and ICA bulb but no stenosis or irregularity. Cervical ICA is tortuous but widely patent. Vertebral arteries: Right vertebral artery is dominant. Both vertebral artery origins are widely patent. Both vertebral arteries are widely patent through the cervical region to the foramen magnum. Skeleton: Ordinary spondylosis. Other neck: No mass or  lymphadenopathy. Upper chest: Negative. Somewhat unusual collateral vein bulging into the suprasternal notch. Review of the MIP images confirms the above findings CTA HEAD FINDINGS Anterior circulation: Both internal carotid arteries are patent through the skull base and siphon regions. There is ordinary atherosclerotic calcification in the carotid siphons but no stenosis. The anterior and middle cerebral vessels are patent without proximal stenosis, aneurysm or vascular malformation. No missing vessels are identified. Posterior circulation: Both vertebral arteries are widely patent to the basilar. No basilar stenosis. Posterior circulation branch vessels are patent. Left PCA takes fetal origin from the anterior circulation. Venous sinuses: Patent and normal. Anatomic variants: None significant. Delayed phase: No abnormal enhancement. Review of the MIP images confirms the above findings CT Brain Perfusion Findings: CBF (<30%) Volume: 65m Perfusion (Tmax>6.0s) volume: 035mMismatch Volume: 25m73mnfarction Location:None identified. IMPRESSION: Negative CT perfusion study. No confirmatory evidence of right basal ganglia stroke. CT angiography does not show any stenotic disease or large or medium vessel occlusion. Ordinary mild atherosclerosis. Tortuous vessels suggesting a history of hypertension. These results were communicated to Dr. AroRory Percy 10:26 amon 7/17/2019by text page via the [employer]aging system. Electronically Signed   By: MarNelson ChimesD.   On: 04/16/2018 10:27   Mr Brain Wo Contrast  Result Date: 04/16/2018 CLINICAL DATA:  Speech difficulty. Encephalopathy. Weakness. History of end-stage renal disease and multiple myeloma. EXAM: MRI HEAD WITHOUT CONTRAST TECHNIQUE: Multiplanar, multiecho pulse sequences of the brain and surrounding structures were obtained without intravenous contrast. COMPARISON:  Head CT 04/16/2018 FINDINGS: The examination had to be discontinued prior to completion due to patient's  altered mental status. A complete noncontrast examination was obtained with the exception of sagittal and axial T1 weighted imaging. The study is motion degraded throughout, with moderate to severe motion on the axial FLAIR and coronal T2 sequences. Brain: There is no evidence of acute infarct, mass, midline shift, or extra-axial fluid collection. A single focus of chronic microhemorrhage is noted in the posteromedial right temporal lobe. There is mild cerebral atrophy. Patchy T2 hyperintensities are present in the cerebral white matter, nonspecific but compatible with mild chronic small vessel ischemic disease. Chronic lacunar infarcts are present in the left caudate nucleus, left thalamus, and left cerebellum. Vascular: Major intracranial vascular flow voids are preserved. Skull and upper cervical spine: Diffuse calvarial marrow heterogeneity which likely reflects patient's history of myeloma. Sinuses/Orbits: Unremarkable orbits. Paranasal sinuses and mastoid air cells are clear. Other: None. IMPRESSION: 1. Incomplete, motion degraded examination. 2. No acute infarct or other acute intracranial abnormality identified. 3. Mild chronic small vessel ischemic disease with chronic lacunar infarcts as above. Electronically Signed   By: AllLogan BoresD.   On: 04/16/2018 15:59   Ct Cerebral Perfusion W Contrast  Result Date: 04/16/2018 CLINICAL DATA:  Slurred speech. Altered level of consciousness. Last seen normal 2100 hours. Possible right basal ganglia infarction by CT. EXAM: CT ANGIOGRAPHY HEAD AND NECK CT PERFUSION BRAIN TECHNIQUE: Multidetector CT imaging of the head and neck was performed using the standard protocol during bolus administration of intravenous contrast. Multiplanar  CT image reconstructions and MIPs were obtained to evaluate the vascular anatomy. Carotid stenosis measurements (when applicable) are obtained utilizing NASCET criteria, using the distal internal carotid diameter as the denominator.  Multiphase CT imaging of the brain was performed following IV bolus contrast injection. Subsequent parametric perfusion maps were calculated using RAPID software. CONTRAST:  91m ISOVUE-370 IOPAMIDOL (ISOVUE-370) INJECTION 76% COMPARISON:  CT same day. FINDINGS: CTA NECK FINDINGS Aortic arch: Aortic atherosclerosis. No aneurysm or dissection. Branching pattern of the brachiocephalic vessels is normal without origin stenosis. Right carotid system: Common carotid artery is tortuous but widely patent to the bifurcation. There is minor plaque at the carotid bifurcation but no stenosis. Cervical ICA is tortuous but widely patent. Left carotid system: Common carotid artery is widely patent to the bifurcation. Calcified plaque at the bifurcation and ICA bulb but no stenosis or irregularity. Cervical ICA is tortuous but widely patent. Vertebral arteries: Right vertebral artery is dominant. Both vertebral artery origins are widely patent. Both vertebral arteries are widely patent through the cervical region to the foramen magnum. Skeleton: Ordinary spondylosis. Other neck: No mass or lymphadenopathy. Upper chest: Negative. Somewhat unusual collateral vein bulging into the suprasternal notch. Review of the MIP images confirms the above findings CTA HEAD FINDINGS Anterior circulation: Both internal carotid arteries are patent through the skull base and siphon regions. There is ordinary atherosclerotic calcification in the carotid siphons but no stenosis. The anterior and middle cerebral vessels are patent without proximal stenosis, aneurysm or vascular malformation. No missing vessels are identified. Posterior circulation: Both vertebral arteries are widely patent to the basilar. No basilar stenosis. Posterior circulation branch vessels are patent. Left PCA takes fetal origin from the anterior circulation. Venous sinuses: Patent and normal. Anatomic variants: None significant. Delayed phase: No abnormal enhancement. Review of  the MIP images confirms the above findings CT Brain Perfusion Findings: CBF (<30%) Volume: 095mPerfusion (Tmax>6.0s) volume: 58m38mismatch Volume: 58mL71mfarction Location:None identified. IMPRESSION: Negative CT perfusion study. No confirmatory evidence of right basal ganglia stroke. CT angiography does not show any stenotic disease or large or medium vessel occlusion. Ordinary mild atherosclerosis. Tortuous vessels suggesting a history of hypertension. These results were communicated to Dr. ArorRory Percy10:26 amon 7/17/2019by text page via the AMIO[facility] Incsaging system. Electronically Signed   By: MarkNelson Chimes.   On: 04/16/2018 10:27   Dg Chest Port 1 View  Result Date: 04/16/2018 CLINICAL DATA:  Altered mental status. EXAM: PORTABLE CHEST 1 VIEW COMPARISON:  Chest x-ray dated March 27, 2018. FINDINGS: Stable cardiomegaly. Normal pulmonary vascularity. No focal consolidation, pleural effusion, or pneumothorax. No acute osseous abnormality. IMPRESSION: Stable cardiomegaly.  No active disease. Electronically Signed   By: WillTitus Dubin.   On: 04/16/2018 11:26   Dg Swallowing Func-speech Pathology  Result Date: 04/18/2018 Objective Swallowing Evaluation: Type of Study: MBS-Modified Barium Swallow Study  Patient Details Name: [name]: [id number] of Birth: 6/20Dec 15, 1942ay's Date: 04/18/2018 Time: SLP Start Time (ACUTE ONLY): 1013 -SLP Stop Time (ACUTE ONLY): 1030 SLP Time Calculation (min) (ACUTE ONLY): 17 min Past Medical History: Past Medical History: Diagnosis Date . Anxiety  . Closed fracture of right distal femur (HCC)[place]/10/2014 . Depression  . [facility] [place] ([facility]2017) . GERD (gastroesophageal reflux disease) 04/23/2015 . HCAP (healthcare-associated pneumonia) 04/24/2017 . Heart murmur  . History of blood transfusion   "low HgB when I was going to dialysis center" . Hypertension  . Hypertension  associatd with end stage renal disease on  dialysis 03/11/2014 . Malnutrition of moderate degree 09/02/2015 . Multiple myeloma ([place]) 03/11/2014 . Patient is Jehovah's Witness   "I'd rather never have any blood transfusions unless absolutely necessary; please check with me 1st". (04/24/2017) . Pneumonia ~ 2016 Past Surgical History: Past Surgical History: Procedure Laterality Date . AV FISTULA PLACEMENT Left  . I&D EXTREMITY Left 02/27/2016  Procedure: ARTHROSCOPIC IRRIGATION AND DEBRIDEMENT EXTREMITY;  Surgeon: Renette Butters, MD;  Location: [facility];  Service: Orthopedics;  Laterality: Left; . TEE WITHOUT CARDIOVERSION N/A 02/29/2016  Procedure: TRANSESOPHAGEAL ECHOCARDIOGRAM (TEE);  Surgeon: Thayer Headings, MD;  Location: [facility] ENDOSCOPY;  Service: Cardiovascular;  Laterality: N/A; HPI: 77 yo female adm [facility] with AMS, concern for CVA- MRI showed mild chronic small vessel disease, otherwise negative.  CXR negative 04/16/18.  Pt PMH + for ESRD on dialysis, multiple myeloma.   Subjective: pt awake in chair Assessment / Plan / Recommendation CHL IP CLINICAL IMPRESSIONS 04/18/2018 Clinical Impression Pt presents with mild oral and cervical esophageal dysphagia.  Fortunately pharyngeal swallow is intact.  NO aspiration or penetration of any consistency tested noted.   Decreased oral coordination noted with mastication of soft solids - likely due to recent loss of lower denture - resulting in delayed mastication/oral transiting and residuals.   Pt was able to orally transit barium tablet with thin.  Single episode of belching and throat clearing noted with observation of backflow of liquid barium into lower pharynx.  Pt reflexively swallowed to clear backflowed material but this will increase her aspiration risk.  Recommend pt continue diet with precautions including drinking liquids during meal *mindful of renal restriction*, prefer small frequent meals and staying upright after meals.  Brief follow up at SNF with SLP recommended to help mitigate dysphagia.  In future,  dedicated esophageal evaluation may be helpful if symptoms worsen/nutrition becomes issue.   Using live video, pt educated to findings/recommendations.   SLP Visit Diagnosis -- Attention and concentration deficit following -- Frontal lobe and executive function deficit following -- Impact on safety and function Mild aspiration risk   CHL IP TREATMENT RECOMMENDATION 04/18/2018 Treatment Recommendations No treatment recommended at this time   Prognosis 04/18/2018 Prognosis for Safe Diet Advancement Good Barriers to Reach Goals -- Barriers/Prognosis Comment -- CHL IP DIET RECOMMENDATION 04/18/2018 SLP Diet Recommendations Dysphagia 3 (Mech soft) solids;Thin liquid Liquid Administration via Cup;Straw Medication Administration Whole meds with liquid Compensations Slow rate;Small sips/bites Postural Changes Seated upright at 90 degrees;Remain semi-upright after after feeds/meals (Comment)   CHL IP OTHER RECOMMENDATIONS 04/18/2018 Recommended Consults -- Oral Care Recommendations Oral care BID Other Recommendations --   CHL IP FOLLOW UP RECOMMENDATIONS 04/18/2018 Follow up Recommendations Skilled Nursing facility   No flowsheet data found.     CHL IP ORAL PHASE 04/18/2018 Oral Phase Impaired Oral - Pudding Teaspoon -- Oral - Pudding Cup -- Oral - Honey Teaspoon -- Oral - Honey Cup -- Oral - Nectar Teaspoon -- Oral - Nectar Cup WFL Oral - Nectar Straw -- Oral - Thin Teaspoon WFL Oral - Thin Cup WFL Oral - Thin Straw WFL Oral - Puree WFL Oral - Mech Soft Impaired mastication;Lingual/palatal residue Oral - Regular -- Oral - Multi-Consistency -- Oral - Pill WFL Oral Phase - Comment --  CHL IP PHARYNGEAL PHASE 04/18/2018 Pharyngeal Phase WFL Pharyngeal- Pudding Teaspoon -- Pharyngeal -- Pharyngeal- Pudding Cup -- Pharyngeal -- Pharyngeal- Honey Teaspoon -- Pharyngeal -- Pharyngeal- Honey Cup -- Pharyngeal -- Pharyngeal- Nectar Teaspoon --  Pharyngeal -- Pharyngeal- Nectar Cup -- Pharyngeal -- Pharyngeal- Nectar Straw -- Pharyngeal --  Pharyngeal- Thin Teaspoon -- Pharyngeal -- Pharyngeal- Thin Cup -- Pharyngeal -- Pharyngeal- Thin Straw -- Pharyngeal -- Pharyngeal- Puree -- Pharyngeal -- Pharyngeal- Mechanical Soft -- Pharyngeal -- Pharyngeal- Regular -- Pharyngeal -- Pharyngeal- Multi-consistency -- Pharyngeal -- Pharyngeal- Pill -- Pharyngeal -- Pharyngeal Comment --  CHL IP CERVICAL ESOPHAGEAL PHASE 04/18/2018 Cervical Esophageal Phase Impaired Pudding Teaspoon -- Pudding Cup -- Honey Teaspoon -- Honey Cup -- Nectar Teaspoon -- Nectar Cup -- Nectar Straw -- Thin Teaspoon -- Thin Cup -- Thin Straw -- Puree -- Mechanical Soft -- Regular -- Multi-consistency -- Pill -- Cervical Esophageal Comment appearance of backflow x1 of liquids post-swallow to pharynx, pt re-swallowed to clear; at completion of mbs after pill administered,  pt appeared with barium residuals in esophagus without sensation - water intake appeared to clear - radiologist not present to confirm No flowsheet data found. [name] 04/18/2018, 10:45 AM  Luanna Salk, MS [facility] SLP [phone]             US Soft Tissue Neck  Result Date: 04/18/2018 CLINICAL DATA:  77 year old with neck swelling. Area of concern is near the midline. EXAM: ULTRASOUND OF HEAD/NECK SOFT TISSUES TECHNIQUE: Ultrasound examination of the head and neck soft tissues was performed in the area of clinical concern. COMPARISON:  Neck CTA 04/16/2018 FINDINGS: Evidence for mild subcutaneous edema. No focal soft tissue or cystic lesion at the area of concern. Patient has prominent venous structures along the midline of the neck based on the recent neck CT. IMPRESSION: No focal abnormality at the area of concern. Mild subcutaneous edema. Electronically Signed   By: Markus Daft M.D.   On: 04/18/2018 12:23   Ct Head Code Stroke Wo Contrast  Result Date: 04/16/2018 CLINICAL DATA:  Code stroke. Slurred speech. Altered level of consciousness. Last seen normal 2100 hours. EXAM: CT HEAD WITHOUT CONTRAST  TECHNIQUE: Contiguous axial images were obtained from the base of the skull through the vertex without intravenous contrast. COMPARISON:  02/20/2018 FINDINGS: Brain: Mild generalized atrophy. Chronic small-vessel ischemic changes of the hemispheric white matter. Slightly more prominent focal low density in the right basal ganglia/anterior limb of the internal capsule which could be an acute infarction. No evidence of large vessel territory infarction. No mass lesion, hemorrhage, hydrocephalus or extra-axial collection. Vascular: There is atherosclerotic calcification of the major vessels at the base of the brain. Skull: Scattered lucencies within the calvarium consistent with the clinical history of myeloma. These are slightly progressive since the prior study. Sinuses/Orbits: Normal Other: None ASPECTS ([place] Stroke Program Early CT Score) - Ganglionic level infarction (caudate, lentiform nuclei, internal capsule, insula, M1-M3 cortex): 6 - Supraganglionic infarction (M4-M6 cortex): 3 Total score (0-10 with 10 being normal): 9 IMPRESSION: 1. Background pattern of atrophy and chronic small-vessel ischemic change. More prominent low-density in the basal ganglia/anterior limb internal capsule on the right suggesting an acute infarction in that location. 2. ASPECTS is 9. 3. Slightly progressive calvarial lucencies consistent with the clinical history of myeloma. 4. These results were communicated to Dr. Rory Percy at 9:43 amon 7/17/2019by text page via the [facility] messaging system. Electronically Signed   By: Nelson Chimes M.D.   On: 04/16/2018 09:45      Subjective: Patient seen and examined the bedside this morning.  Remains comfortable.  Currently on her baseline mental status.  No new issues/events.  Stable for discharge to home today.  Discharge Exam: Vitals:  04/18/18 0743 04/18/18 1150  BP: (!) 154/85 (!) 145/89  Pulse: 68 61  Resp: 18 17  Temp: 98.9 F (37.2 C) 98.1 F (36.7 C)  SpO2: 100% 100%    Vitals:   04/18/18 0436 04/18/18 0600 04/18/18 0743 04/18/18 1150  BP: (!) 157/96  (!) 154/85 (!) 145/89  Pulse: 68  68 61  Resp:   18 17  Temp:   98.9 F (37.2 C) 98.1 F (36.7 C)  TempSrc:   Oral Oral  SpO2:   100% 100%  Weight:  62.7 kg (138 lb 3.7 oz)      General: Pt is alert, awake, not in acute distress Cardiovascular: RRR, S1/S2 +, no rubs, no gallops Respiratory: CTA bilaterally, no wheezing, no rhonchi Abdominal: Soft, NT, ND, bowel sounds + Extremities: no edema, no cyanosis    The results of significant diagnostics from this hospitalization (including imaging, microbiology, ancillary and laboratory) are listed below for reference.     Microbiology: Recent Results (from the past 240 hour(s))  Urine culture     Status: Abnormal (Preliminary result)   Collection Time: 04/16/18  1:00 PM  Result Value Ref Range Status   Specimen Description URINE, RANDOM  Final   Special Requests NONE  Final   Culture (A)  Final    >=100,000 COLONIES/mL UNIDENTIFIED ORGANISM IDENTIFICATION AND SUSCEPTIBILITIES TO FOLLOW Performed at [facility], [address] [place], [place] [address]    Report Status PENDING  Incomplete  MRSA PCR Screening     Status: None   Collection Time: 04/16/18  8:56 PM  Result Value Ref Range Status   MRSA by PCR NEGATIVE NEGATIVE Final    Comment:        The GeneXpert MRSA Assay (FDA approved for NASAL specimens only), is one component of a comprehensive MRSA colonization surveillance program. It is not intended to diagnose MRSA infection nor to guide or monitor treatment for MRSA infections. Performed at [facility], [place] [address], [place], [place] [address]      Labs: BNP (last 3 results) Recent Labs    06/26/17 1824  BNP 979.8*   Basic Metabolic Panel: Recent Labs  Lab 04/15/18 04/16/18 0929 04/16/18 0935 04/16/18 1443 04/17/18 0610 04/18/18 0330  NA 137 134* 136  --  136 135  K 3.9 3.9 3.8  --  4.3 3.9   CL  --  95* 98  --  99 96*  CO2  --  24  --   --  24 28  GLUCOSE  --  96 101*  --  85 57*  BUN 45* 51* 52*  --  59* 23  CREATININE 9.1* 10.96* 12.60* 11.42* 12.59* 7.64*  CALCIUM  --  9.9  --   --  9.5 8.9   Liver Function Tests: Recent Labs  Lab 04/16/18 0929  AST 22  ALT 12  ALKPHOS 42  BILITOT 0.8  PROT 9.5*  ALBUMIN 3.8   No results for input(s): LIPASE, AMYLASE in the last 168 hours. No results for input(s): AMMONIA in the last 168 hours. CBC: Recent Labs  Lab 04/16/18 0929 04/16/18 0935 04/16/18 1443 04/17/18 0610 04/18/18 0330  WBC 3.1*  --  2.6* 2.7* 2.6*  NEUTROABS 1.8  --   --   --  1.7  HGB 10.5* 11.9* 10.0* 9.9* 9.3*  HCT 34.8* 35.0* 32.3* 31.2* 30.1*  MCV 113.0*  --  111.4* 108.7* 111.5*  PLT 88*  --  103* 99* 123*  Cardiac Enzymes: Recent Labs  Lab 04/16/18 1443  TROPONINI 0.03*   BNP: Invalid input(s): POCBNP CBG: Recent Labs  Lab 04/18/18 0634 04/18/18 0652  GLUCAP 49* 85   D-Dimer No results for input(s): DDIMER in the last 72 hours. Hgb A1c No results for input(s): HGBA1C in the last 72 hours. Lipid Profile No results for input(s): CHOL, HDL, LDLCALC, TRIG, CHOLHDL, LDLDIRECT in the last 72 hours. Thyroid function studies Recent Labs    04/16/18 1443  TSH 2.306   Anemia work up No results for input(s): VITAMINB12, FOLATE, FERRITIN, TIBC, IRON, RETICCTPCT in the last 72 hours. Urinalysis    Component Value Date/Time   COLORURINE YELLOW 04/16/2018 1305   APPEARANCEUR CLEAR 04/16/2018 1305   LABSPEC 1.009 04/16/2018 1305   PHURINE 9.0 (H) 04/16/2018 1305   GLUCOSEU 50 (A) 04/16/2018 1305   HGBUR SMALL (A) 04/16/2018 1305   BILIRUBINUR NEGATIVE 04/16/2018 1305   KETONESUR NEGATIVE 04/16/2018 1305   PROTEINUR 100 (A) 04/16/2018 1305   UROBILINOGEN 0.2 04/10/2015 1800   NITRITE NEGATIVE 04/16/2018 1305   LEUKOCYTESUR MODERATE (A) 04/16/2018 1305   Sepsis Labs Invalid input(s): PROCALCITONIN,  WBC,   LACTICIDVEN Microbiology Recent Results (from the past 240 hour(s))  Urine culture     Status: Abnormal (Preliminary result)   Collection Time: 04/16/18  1:00 PM  Result Value Ref Range Status   Specimen Description URINE, RANDOM  Final   Special Requests NONE  Final   Culture (A)  Final    >=100,000 COLONIES/mL UNIDENTIFIED ORGANISM IDENTIFICATION AND SUSCEPTIBILITIES TO FOLLOW Performed at [facility], [address] [place], [place] [address]    Report Status PENDING  Incomplete  MRSA PCR Screening     Status: None   Collection Time: 04/16/18  8:56 PM  Result Value Ref Range Status   MRSA by PCR NEGATIVE NEGATIVE Final    Comment:        The GeneXpert MRSA Assay (FDA approved for NASAL specimens only), is one component of a comprehensive MRSA colonization surveillance program. It is not intended to diagnose MRSA infection nor to guide or monitor treatment for MRSA infections. Performed at [facility], [place] [address] [place], [place] [address]     Please note: You were cared for by a hospitalist during your hospital stay. Once you are discharged, your primary care physician will handle any further medical issues. Please note that NO REFILLS for any discharge medications will be authorized once you are discharged, as it is imperative that you return to your primary care physician (or establish a relationship with a primary care physician if you do not have one) for your post hospital discharge needs so that they can reassess your need for medications and monitor your lab values.    Time coordinating discharge: 40 minutes  SIGNED:   Shelly Coss, MD  [facility] 04/18/2018, 1:21 PM Pager [phone]  If 7PM-7AM, please contact night-coverage [email or web address] Password [email or web address]

## 2018-04-18 NOTE — Progress Notes (Signed)
Hypoglycemic Event  CBG:  49  Treatment:   240 ml oj + pudding Symptoms: none  Follow-up CBG: Time 0630 CBG Result: 85  Possible Reasons for Event:  Renal no hs snack, gen poor po  Comments/MD notified:  Adhikar    Michaela Corner

## 2018-04-18 NOTE — Progress Notes (Signed)

## 2018-04-18 NOTE — Progress Notes (Signed)
[facility] and gave report to nurse Ramatto.

## 2018-04-18 NOTE — Progress Notes (Signed)
Modified Barium Swallow Progress Note  Patient Details  Name: [name] MRN: [id number] Date of Birth: 06-Aug-1941  Today's Date: 04/18/2018  Modified Barium Swallow completed.  Full report located under Chart Review in the Imaging Section.  Brief recommendations include the following:  Clinical Impression  Pt presents with mild oral and cervical esophageal dysphagia.  Fortunately pharyngeal swallow is intact.  NO aspiration or penetration of any consistency tested noted.   Decreased oral coordination noted with mastication of soft solids - likely due to recent loss of lower denture - resulting in delayed mastication/oral transiting and residuals.   Pt was able to orally transit barium tablet with thin.  Single episode of belching and throat clearing noted with observation of backflow of liquid barium into lower pharynx.  Pt reflexively swallowed to clear backflowed material but this will increase her aspiration risk.  Recommend pt continue diet with precautions including drinking liquids during meal *mindful of renal restriction*, prefer small frequent meals and staying upright after meals.  Brief follow up at SNF with SLP recommended to help mitigate dysphagia.  In future, dedicated esophageal evaluation may be helpful if symptoms worsen/nutrition becomes issue.   Using live video, pt educated to findings/recommendations.     Swallow Evaluation Recommendations       SLP Diet Recommendations: Dysphagia 3 (Mech soft) solids;Thin liquid   Liquid Administration via: Cup;Straw   Medication Administration: Whole meds with liquid   Supervision: Patient able to self feed   Compensations: Slow rate;Small sips/bites(drink liquids t/o meal) Intermittent swallow   Postural Changes: Seated upright at 90 degrees;Remain semi-upright after after feeds/meals (Comment)   Oral Care Recommendations: Oral care BID      Luanna Salk, MS [facility] SLP [phone]   [name] 04/18/2018,10:44  AM

## 2018-04-18 NOTE — Progress Notes (Signed)
Patient discharged via [facility] transport with no IV or hospital devices. Telemetry d/c'd after calling CMT to notify.

## 2018-04-18 NOTE — Care Management Important Message (Signed)
Important Message  Patient Details  Name: [name] MRN: [id number] Date of Birth: [birth year]   Medicare Important Message Given:  Yes    [name] 04/18/2018, 3:20 PM

## 2018-04-18 NOTE — Progress Notes (Signed)
Admit: 04/16/2018 LOS: 2  Subjective:  . HD yesterday, 2.3L UF post weight 66kg . More awake and alert yesterday esp post HD and this AM . No c/o  07/18 0701 - 07/19 0700 In: 240 [P.O.:240] Out: 3100 [Urine:800]  Filed Weights   04/17/18 1240 04/17/18 1610 04/18/18 0600  Weight: 63.8 kg (140 lb 10.5 oz) 66 kg (145 lb 8.1 oz) 62.7 kg (138 lb 3.7 oz)    Scheduled Meds: . amLODipine  10 mg Oral Daily  . calcium acetate  2,001 mg Oral TID WC  . Chlorhexidine Gluconate Cloth  6 each Topical Q0600  . darbepoetin (ARANESP) injection - DIALYSIS  100 mcg Intravenous Q Thu-HD  . doxercalciferol  4 mcg Intravenous Q T,Th,Sa-HD  . famotidine  20 mg Oral Daily  . heparin  5,000 Units Subcutaneous Q8H  . isosorbide mononitrate  60 mg Oral Daily  . latanoprost  1 drop Both Eyes QHS  . LORazepam  0.5 mg Oral QHS  . losartan  50 mg Oral QHS  . multivitamin  1 tablet Oral QHS  . sertraline  50 mg Oral Daily  . sevelamer carbonate  800 mg Oral TID WC  . sodium chloride flush  3 mL Intravenous Q12H  . valACYclovir  500 mg Oral Q48H   Continuous Infusions: . sodium chloride     PRN Meds:.sodium chloride, acetaminophen **OR** acetaminophen, cloNIDine, hydrALAZINE, loperamide, ondansetron **OR** ondansetron (ZOFRAN) IV, sodium chloride flush  Current Labs: reviewed    Physical Exam:  Blood pressure (!) 154/85, pulse 68, temperature 98.9 F (37.2 C), temperature source Oral, resp. rate 18, weight 62.7 kg (138 lb 3.7 oz), SpO2 100 %. NAD, awake, alert pleaant RRR. Nl s1s2 Dimished a tad in the bases LUE AVF B/T present No LEE  Dialysis Orders:  TTS - [facility]  3.5hrs, BFR 425, DFR AF1.5,  EDW 64kg, 2K/ 2.25Ca  Access: LU AVF  Heparin None EPO 19000 Units IV qHD Hectorol 53mg IV qHD    A 1.  AMS - etiology unclear but now resolved, no CVA by CT/MRI   2.  ESRD -  TTS patient, next HD tomorrow on schedule as outpatient 3.  Hypertension/volume  - Stable, EDW probably remains correct  as outpt 4.  Anemia of CKD - Hgb 9.9.  Continue retacrit qHD.  5.  Secondary Hyperparathyroidism -  Ca in goal. No inpatient issues. Continue VDRA and binders.  6.  Nutrition - Renal diet w/fluid restrictions. Renavite. 7. Multiple myeloma 8. Hx pancytopenia 9. GERD     RPearson GrippeMD 04/18/2018, 9:00 AM  Recent Labs  Lab 04/16/18 0929 04/16/18 0935 04/16/18 1443 04/17/18 0610 04/18/18 0330  NA 134* 136  --  136 135  K 3.9 3.8  --  4.3 3.9  CL 95* 98  --  99 96*  CO2 24  --   --  24 28  GLUCOSE 96 101*  --  85 57*  BUN 51* 52*  --  59* 23  CREATININE 10.96* 12.60* 11.42* 12.59* 7.64*  CALCIUM 9.9  --   --  9.5 8.9   Recent Labs  Lab 04/16/18 0929  04/16/18 1443 04/17/18 0610 04/18/18 0330  WBC 3.1*  --  2.6* 2.7* 2.6*  NEUTROABS 1.8  --   --   --  1.7  HGB 10.5*   < > 10.0* 9.9* 9.3*  HCT 34.8*   < > 32.3* 31.2* 30.1*  MCV 113.0*  --  111.4* 108.7* 111.5*  PLT  88*  --  103* 99* 123*   < > = values in this interval not displayed.

## 2018-04-18 NOTE — Plan of Care (Signed)
Education adequate for discharge 

## 2018-04-19 LAB — HEPATITIS B SURFACE ANTIGEN: Hepatitis B Surface Ag: NEGATIVE

## 2018-04-21 ENCOUNTER — Encounter: Payer: Self-pay | Admitting: Adult Health

## 2018-04-21 ENCOUNTER — Non-Acute Institutional Stay (SKILLED_NURSING_FACILITY): Payer: Medicare Other | Admitting: Adult Health

## 2018-04-21 ENCOUNTER — Telehealth: Payer: Self-pay

## 2018-04-21 DIAGNOSIS — F419 Anxiety disorder, unspecified: Secondary | ICD-10-CM | POA: Diagnosis not present

## 2018-04-21 DIAGNOSIS — Z992 Dependence on renal dialysis: Secondary | ICD-10-CM

## 2018-04-21 DIAGNOSIS — R197 Diarrhea, unspecified: Secondary | ICD-10-CM

## 2018-04-21 DIAGNOSIS — R1310 Dysphagia, unspecified: Secondary | ICD-10-CM

## 2018-04-21 DIAGNOSIS — C9000 Multiple myeloma not having achieved remission: Secondary | ICD-10-CM

## 2018-04-21 DIAGNOSIS — N186 End stage renal disease: Secondary | ICD-10-CM

## 2018-04-21 DIAGNOSIS — F329 Major depressive disorder, single episode, unspecified: Secondary | ICD-10-CM

## 2018-04-21 DIAGNOSIS — I2510 Atherosclerotic heart disease of native coronary artery without angina pectoris: Secondary | ICD-10-CM

## 2018-04-21 DIAGNOSIS — F32A Depression, unspecified: Secondary | ICD-10-CM

## 2018-04-21 DIAGNOSIS — D696 Thrombocytopenia, unspecified: Secondary | ICD-10-CM

## 2018-04-21 DIAGNOSIS — I10 Essential (primary) hypertension: Secondary | ICD-10-CM | POA: Diagnosis not present

## 2018-04-21 DIAGNOSIS — G9340 Encephalopathy, unspecified: Secondary | ICD-10-CM | POA: Diagnosis not present

## 2018-04-21 NOTE — Telephone Encounter (Signed)
Possible re-admission to facility. This is a patient you were seeing at [facility].Marland Kitchen [facility] F/U is needed if patient was re-admitted to facility upon discharge. Hospital discharge from [facility] on 04/18/2018

## 2018-04-21 NOTE — Progress Notes (Signed)
Location:  [place] Room Number: 109-A Place of Service:  SNF (31) Provider:  Durenda Age, NP  Patient Care Team: Hendricks Limes, MD as PCP - General (Internal Medicine) Medina-Vargas, Senaida Lange, NP as Nurse Practitioner (Internal Medicine)  Extended Emergency Contact Information Primary Emergency Contact: Wilburn Mylar [place] of [place] Phone: [phone] Mobile Phone: [phone] Relation: Daughter Secondary Emergency Contact: Odie Sera [place] of [place] Mobile Phone: [phone] Relation: Brother  Code Status:  Full Code  Goals of care: Advanced Directive information Advanced Directives 06/26/2017  Does Patient Have a Medical Advance Directive? No  Type of Advance Directive -  Does patient want to make changes to medical advance directive? -  Copy of [place] in Chart? -  Would patient like information on creating a medical advance directive? -     Chief Complaint  Patient presents with  . Acute Visit    Hospital followup, status post hospitalization at [facility] 7/17-7/19/19 for encephalopathy.      HPI:  Pt is a 77 y.o. female seen today for hospital followup.  She was readmitted to [place] for long-term care on 04/18/18 following an admission at [facility] 7/17-7/19/19 for encephalopathy.  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 She presented to the hospital with slurred speech, left deviated gaze and left facial droop. Neurology was consulted since stroke was suspected. MRI did not show any acute intracranial abnormalities. Altered mental status was thought to be secondary to uremia. She had hemodialysis with significant improvement in her mental status.   She was seen in her room today and was  Complaining of having diarrhea X 1 since morning today. She was trying to call [facility] hospital and was so  upset since she was having difficulty using her phone. I instructed her to dial 1 first before dialing the area code. She then said,"Is that so?"    Past Medical History:  Diagnosis Date  . Anxiety   . Closed fracture of right distal femur ([place]) 09/01/2015  . Depression   . ESRD (end stage renal disease) on dialysis [facility])    "TTS; [place]" (04/24/2017)  . GERD (gastroesophageal reflux disease) 04/23/2015  . HCAP (healthcare-associated pneumonia) 04/24/2017  . Heart murmur   . History of blood transfusion    "low HgB when I was going to dialysis center"  . Hypertension   . Hypertension associatd with end stage renal disease on dialysis 03/11/2014  . Malnutrition of moderate degree 09/02/2015  . Multiple myeloma ([place]) 03/11/2014  . Patient is Jehovah's Witness    "I'd rather never have any blood transfusions unless absolutely necessary; please check with me 1st". (04/24/2017)  . Pneumonia ~ 2016   Past Surgical History:  Procedure Laterality Date  . AV FISTULA PLACEMENT Left   . I&D EXTREMITY Left 02/27/2016   Procedure: ARTHROSCOPIC IRRIGATION AND DEBRIDEMENT EXTREMITY;  Surgeon: Renette Butters, MD;  Location: [place];  Service: Orthopedics;  Laterality: Left;  . TEE WITHOUT CARDIOVERSION N/A 02/29/2016   Procedure: TRANSESOPHAGEAL ECHOCARDIOGRAM (TEE);  Surgeon: Thayer Headings, MD;  Location[facility] ENDOSCOPY;  Service: Cardiovascular;  Laterality: N/A;    No Known Allergies  Outpatient Encounter Medications as of 04/21/2018  Medication Sig  . amLODipine (NORVASC) 10 MG tablet Take 10 mg by mouth daily. Hold for SBP <100MM/HG  . bisacodyl (BISAC-EVAC) 10 MG suppository Place 10 mg rectally once as needed for  moderate constipation (FOR CONSTIPATION NOT RELIEVED BY MILK OF MAGNESIA).   . calcium acetate (PHOSLO) 667 MG capsule Take 2,001 mg by mouth 3 (three) times daily.   . cloNIDine (CATAPRES) 0.1 MG tablet Take 0.1 mg by mouth every 8 (eight) hours as needed (for a systolic B/P of 233  or greater).   . hydrocortisone cream 1 % Apply 1 application topically every 8 (eight) hours as needed for itching. Apply to rectum for hemorrhoids  . isosorbide mononitrate (IMDUR) 60 MG 24 hr tablet Take 60 mg by mouth daily.   Marland Kitchen lamoTRIgine (LAMICTAL) 25 MG tablet Take 50 mg by mouth daily.   Marland Kitchen latanoprost (XALATAN) 0.005 % ophthalmic solution Place 1 drop into both eyes at bedtime.   Marland Kitchen loperamide (IMODIUM A-D) 2 MG tablet Take 4 mg by mouth as needed for diarrhea or loose stools.  Marland Kitchen LORazepam (ATIVAN) 0.5 MG tablet Take 1 tablet (0.5 mg total) by mouth at bedtime.  Marland Kitchen losartan (COZAAR) 50 MG tablet Take 50 mg by mouth at bedtime.  . multivitamin (RENA-VIT) TABS tablet Take 1 tablet by mouth daily.  . ondansetron (ZOFRAN) 4 MG tablet Take 4 mg by mouth every 8 (eight) hours as needed for nausea or vomiting.  . promethazine (PHENERGAN) 25 MG tablet Take 25 mg by mouth every 6 (six) hours as needed for nausea or vomiting.  . ranitidine (ZANTAC) 300 MG tablet Take 300 mg by mouth at bedtime. Reported on 03/05/2016  . senna-docusate (SENEXON-S) 8.6-50 MG tablet Take 2 tablets by mouth at bedtime.  . sertraline (ZOLOFT) 50 MG tablet Take 50 mg by mouth daily.   . sevelamer carbonate (RENVELA) 800 MG tablet Take 800 mg by mouth 3 (three) times daily with meals.   . simethicone (MYLICON) 007 MG chewable tablet Chew 125 mg by mouth 3 (three) times daily. Take 1 tablet by mouth prior to each meal at 8AM, 11AM, and 6PM  . valACYclovir (VALTREX) 500 MG tablet Take 1 tablet (500 mg total) by mouth every other day.  . [DISCONTINUED] loperamide (IMODIUM A-D) 2 MG tablet Take 2 mg by mouth as needed for diarrhea or loose stools. (pre-dialysis days, per resident request)    No facility-administered encounter medications on file as of 04/21/2018.     Review of Systems  GENERAL: No change in appetite, no fatigue, no weight changes, no fever, chills or weakness MOUTH and THROAT: Denies oral discomfort,  gingival pain or bleeding RESPIRATORY: no cough, SOB, DOE, wheezing, hemoptysis CARDIAC: No chest pain, edema or palpitations GI: +diarrhea X 1 PSYCHIATRIC: Denies feelings of depression or anxiety. No report of hallucinations, insomnia, paranoia, or agitation   Immunization History  Administered Date(s) Administered  . Influenza,inj,Quad PF,6+ Mos 07/23/2014, 08/01/2016  . Influenza,inj,quad, With Preservative 08/07/2017  . PPD Test 04/13/2015   Pertinent  Health Maintenance Due  Topic Date Due  . INFLUENZA VACCINE  07/31/2018 (Originally 05/01/2018)  . PNA vac Low Risk Adult (1 of 2 - PCV13) 08/29/2018 (Originally 03/20/2004)  . DEXA SCAN  11/28/2018 (Originally 03/20/2004)   Fall Risk  06/06/2017 11/09/2016 06/15/2016 05/25/2016 05/23/2016  Falls in the past year? No No No No No  Number falls in past yr: - - - - -  Injury with Fall? - - - - -  Risk Factor Category  - - - - -  Risk for fall due to : - - - - -  Risk for fall due to: Comment - - - - -  Follow up - - - - -  Vitals:   04/21/18 0840  BP: 128/75  Pulse: 72  Resp: 20  Temp: 97.7 F (36.5 C)  TempSrc: Oral  SpO2: 100%  Weight: 138 lb 3.7 oz (62.7 kg)  Height: '5\' 6"'  (1.676 m)   Body mass index is 22.31 kg/m.  Physical Exam  GENERAL APPEARANCE: Well nourished. In no acute distress. Normal body habitus SKIN:  Skin is warm and dry.  MOUTH and THROAT: Lips are without lesions. Oral mucosa is moist and without lesions. Tongue is normal in shape, size, and color and without lesions RESPIRATORY: Breathing is even & unlabored, BS CTAB CARDIAC: RRR, no murmur,no extra heart sounds, no edema, Left arm AV fistula GI: Abdomen soft, normal BS, no masses, no tenderness EXTREMITIES:  Able to move X 4 extremitiest NEUROLOGICAL: There is no tremor. Speech is clear PSYCHIATRIC: Alert and oriented X 3. Affect and behavior are appropriate   Labs reviewed: Recent Labs    04/16/18 0929 04/16/18 0935 04/16/18 1443  04/17/18 0610 04/18/18 0330  NA 134* 136  --  136 135  K 3.9 3.8  --  4.3 3.9  CL 95* 98  --  99 96*  CO2 24  --   --  24 28  GLUCOSE 96 101*  --  85 57*  BUN 51* 52*  --  59* 23  CREATININE 10.96* 12.60* 11.42* 12.59* 7.64*  CALCIUM 9.9  --   --  9.5 8.9   Recent Labs    02/20/18 2030 03/27/18 2235 04/16/18 0929  AST '31 30 22  ' ALT 12* 16 12  ALKPHOS 46 34* 42  BILITOT 0.7 0.4 0.8  PROT 9.5* 8.3* 9.5*  ALBUMIN 3.1* 2.8* 3.8   Recent Labs    03/27/18 2235 04/16/18 0929  04/16/18 1443 04/17/18 0610 04/18/18 0330  WBC 5.5 3.1*  --  2.6* 2.7* 2.6*  NEUTROABS 4.7 1.8  --   --   --  1.7  HGB 8.3* 10.5*   < > 10.0* 9.9* 9.3*  HCT 27.4* 34.8*   < > 32.3* 31.2* 30.1*  MCV 114.6* 113.0*  --  111.4* 108.7* 111.5*  PLT 72* 88*  --  103* 99* 123*   < > = values in this interval not displayed.   Lab Results  Component Value Date   TSH 2.306 04/16/2018   Lab Results  Component Value Date   HGBA1C 4.9 03/13/2016   Lab Results  Component Value Date   CHOL 202 (A) 11/27/2017   HDL 77 (A) 11/27/2017   LDLCALC 112 11/27/2017   TRIG 65 11/27/2017    Significant Diagnostic Results in last 30 days:  Ct Angio Head W Or Wo Contrast  Result Date: 04/16/2018 CLINICAL DATA:  Slurred speech. Altered level of consciousness. Last seen normal 2100 hours. Possible right basal ganglia infarction by CT. EXAM: CT ANGIOGRAPHY HEAD AND NECK CT PERFUSION BRAIN TECHNIQUE: Multidetector CT imaging of the head and neck was performed using the standard protocol during bolus administration of intravenous contrast. Multiplanar CT image reconstructions and MIPs were obtained to evaluate the vascular anatomy. Carotid stenosis measurements (when applicable) are obtained utilizing NASCET criteria, using the distal internal carotid diameter as the denominator. Multiphase CT imaging of the brain was performed following IV bolus contrast injection. Subsequent parametric perfusion maps were calculated using  RAPID software. CONTRAST:  53m ISOVUE-370 IOPAMIDOL (ISOVUE-370) INJECTION 76% COMPARISON:  CT same day. FINDINGS: CTA NECK FINDINGS Aortic arch: Aortic atherosclerosis. No aneurysm or dissection. Branching pattern of the brachiocephalic vessels  is normal without origin stenosis. Right carotid system: Common carotid artery is tortuous but widely patent to the bifurcation. There is minor plaque at the carotid bifurcation but no stenosis. Cervical ICA is tortuous but widely patent. Left carotid system: Common carotid artery is widely patent to the bifurcation. Calcified plaque at the bifurcation and ICA bulb but no stenosis or irregularity. Cervical ICA is tortuous but widely patent. Vertebral arteries: Right vertebral artery is dominant. Both vertebral artery origins are widely patent. Both vertebral arteries are widely patent through the cervical region to the foramen magnum. Skeleton: Ordinary spondylosis. Other neck: No mass or lymphadenopathy. Upper chest: Negative. Somewhat unusual collateral vein bulging into the suprasternal notch. Review of the MIP images confirms the above findings CTA HEAD FINDINGS Anterior circulation: Both internal carotid arteries are patent through the skull base and siphon regions. There is ordinary atherosclerotic calcification in the carotid siphons but no stenosis. The anterior and middle cerebral vessels are patent without proximal stenosis, aneurysm or vascular malformation. No missing vessels are identified. Posterior circulation: Both vertebral arteries are widely patent to the basilar. No basilar stenosis. Posterior circulation branch vessels are patent. Left PCA takes fetal origin from the anterior circulation. Venous sinuses: Patent and normal. Anatomic variants: None significant. Delayed phase: No abnormal enhancement. Review of the MIP images confirms the above findings CT Brain Perfusion Findings: CBF (<30%) Volume: 21m Perfusion (Tmax>6.0s) volume: 024mMismatch Volume:  34m78mnfarction Location:None identified. IMPRESSION: Negative CT perfusion study. No confirmatory evidence of right basal ganglia stroke. CT angiography does not show any stenotic disease or large or medium vessel occlusion. Ordinary mild atherosclerosis. Tortuous vessels suggesting a history of hypertension. These results were communicated to Dr. AroRory Percy 10:26 amon 7/17/2019by text page via the [facility]aging system. Electronically Signed   By: MarNelson ChimesD.   On: 04/16/2018 10:27   Dg Chest 2 View  Result Date: 03/27/2018 CLINICAL DATA:  Cough EXAM: CHEST - 2 VIEW COMPARISON:  06/26/2017 FINDINGS: The lungs are well inflated. There is moderate cardiomegaly. Bibasilar atelectasis. No pulmonary edema or focal consolidation. There is no pleural effusion or pneumothorax. IMPRESSION: Moderate cardiomegaly and bibasilar atelectasis. Electronically Signed   By: KevUlyses JarredD.   On: 03/27/2018 21:58   Ct Angio Neck W Or Wo Contrast  Result Date: 04/16/2018 CLINICAL DATA:  Slurred speech. Altered level of consciousness. Last seen normal 2100 hours. Possible right basal ganglia infarction by CT. EXAM: CT ANGIOGRAPHY HEAD AND NECK CT PERFUSION BRAIN TECHNIQUE: Multidetector CT imaging of the head and neck was performed using the standard protocol during bolus administration of intravenous contrast. Multiplanar CT image reconstructions and MIPs were obtained to evaluate the vascular anatomy. Carotid stenosis measurements (when applicable) are obtained utilizing NASCET criteria, using the distal internal carotid diameter as the denominator. Multiphase CT imaging of the brain was performed following IV bolus contrast injection. Subsequent parametric perfusion maps were calculated using RAPID software. CONTRAST:  43m1mOVUE-370 IOPAMIDOL (ISOVUE-370) INJECTION 76% COMPARISON:  CT same day. FINDINGS: CTA NECK FINDINGS Aortic arch: Aortic atherosclerosis. No aneurysm or dissection. Branching pattern of the  brachiocephalic vessels is normal without origin stenosis. Right carotid system: Common carotid artery is tortuous but widely patent to the bifurcation. There is minor plaque at the carotid bifurcation but no stenosis. Cervical ICA is tortuous but widely patent. Left carotid system: Common carotid artery is widely patent to the bifurcation. Calcified plaque at the bifurcation and ICA bulb but no stenosis or irregularity. Cervical ICA is tortuous  but widely patent. Vertebral arteries: Right vertebral artery is dominant. Both vertebral artery origins are widely patent. Both vertebral arteries are widely patent through the cervical region to the foramen magnum. Skeleton: Ordinary spondylosis. Other neck: No mass or lymphadenopathy. Upper chest: Negative. Somewhat unusual collateral vein bulging into the suprasternal notch. Review of the MIP images confirms the above findings CTA HEAD FINDINGS Anterior circulation: Both internal carotid arteries are patent through the skull base and siphon regions. There is ordinary atherosclerotic calcification in the carotid siphons but no stenosis. The anterior and middle cerebral vessels are patent without proximal stenosis, aneurysm or vascular malformation. No missing vessels are identified. Posterior circulation: Both vertebral arteries are widely patent to the basilar. No basilar stenosis. Posterior circulation branch vessels are patent. Left PCA takes fetal origin from the anterior circulation. Venous sinuses: Patent and normal. Anatomic variants: None significant. Delayed phase: No abnormal enhancement. Review of the MIP images confirms the above findings CT Brain Perfusion Findings: CBF (<30%) Volume: 15m Perfusion (Tmax>6.0s) volume: 0759mMismatch Volume: 59m19mnfarction Location:None identified. IMPRESSION: Negative CT perfusion study. No confirmatory evidence of right basal ganglia stroke. CT angiography does not show any stenotic disease or large or medium vessel occlusion.  Ordinary mild atherosclerosis. Tortuous vessels suggesting a history of hypertension. These results were communicated to Dr. AroRory Percy 10:26 amon 7/17/2019by text page via the [facility] Pass[employer] system. Electronically Signed   By: MarNelson ChimesD.   On: 04/16/2018 10:27   Mr Brain Wo Contrast  Result Date: 04/16/2018 CLINICAL DATA:  Speech difficulty. Encephalopathy. Weakness. History of end-stage renal disease and multiple myeloma. EXAM: MRI HEAD WITHOUT CONTRAST TECHNIQUE: Multiplanar, multiecho pulse sequences of the brain and surrounding structures were obtained without intravenous contrast. COMPARISON:  Head CT 04/16/2018 FINDINGS: The examination had to be discontinued prior to completion due to patient's altered mental status. A complete noncontrast examination was obtained with the exception of sagittal and axial T1 weighted imaging. The study is motion degraded throughout, with moderate to severe motion on the axial FLAIR and coronal T2 sequences. Brain: There is no evidence of acute infarct, mass, midline shift, or extra-axial fluid collection. A single focus of chronic microhemorrhage is noted in the posteromedial right temporal lobe. There is mild cerebral atrophy. Patchy T2 hyperintensities are present in the cerebral white matter, nonspecific but compatible with mild chronic small vessel ischemic disease. Chronic lacunar infarcts are present in the left caudate nucleus, left thalamus, and left cerebellum. Vascular: Major intracranial vascular flow voids are preserved. Skull and upper cervical spine: Diffuse calvarial marrow heterogeneity which likely reflects patient's history of myeloma. Sinuses/Orbits: Unremarkable orbits. Paranasal sinuses and mastoid air cells are clear. Other: None. IMPRESSION: 1. Incomplete, motion degraded examination. 2. No acute infarct or other acute intracranial abnormality identified. 3. Mild chronic small vessel ischemic disease with chronic lacunar infarcts as above.  Electronically Signed   By: AllLogan BoresD.   On: 04/16/2018 15:59   Ct Cerebral Perfusion W Contrast  Result Date: 04/16/2018 CLINICAL DATA:  Slurred speech. Altered level of consciousness. Last seen normal 2100 hours. Possible right basal ganglia infarction by CT. EXAM: CT ANGIOGRAPHY HEAD AND NECK CT PERFUSION BRAIN TECHNIQUE: Multidetector CT imaging of the head and neck was performed using the standard protocol during bolus administration of intravenous contrast. Multiplanar CT image reconstructions and MIPs were obtained to evaluate the vascular anatomy. Carotid stenosis measurements (when applicable) are obtained utilizing NASCET criteria, using the distal internal carotid diameter as the denominator. Multiphase CT imaging  of the brain was performed following IV bolus contrast injection. Subsequent parametric perfusion maps were calculated using RAPID software. CONTRAST:  2m ISOVUE-370 IOPAMIDOL (ISOVUE-370) INJECTION 76% COMPARISON:  CT same day. FINDINGS: CTA NECK FINDINGS Aortic arch: Aortic atherosclerosis. No aneurysm or dissection. Branching pattern of the brachiocephalic vessels is normal without origin stenosis. Right carotid system: Common carotid artery is tortuous but widely patent to the bifurcation. There is minor plaque at the carotid bifurcation but no stenosis. Cervical ICA is tortuous but widely patent. Left carotid system: Common carotid artery is widely patent to the bifurcation. Calcified plaque at the bifurcation and ICA bulb but no stenosis or irregularity. Cervical ICA is tortuous but widely patent. Vertebral arteries: Right vertebral artery is dominant. Both vertebral artery origins are widely patent. Both vertebral arteries are widely patent through the cervical region to the foramen magnum. Skeleton: Ordinary spondylosis. Other neck: No mass or lymphadenopathy. Upper chest: Negative. Somewhat unusual collateral vein bulging into the suprasternal notch. Review of the MIP  images confirms the above findings CTA HEAD FINDINGS Anterior circulation: Both internal carotid arteries are patent through the skull base and siphon regions. There is ordinary atherosclerotic calcification in the carotid siphons but no stenosis. The anterior and middle cerebral vessels are patent without proximal stenosis, aneurysm or vascular malformation. No missing vessels are identified. Posterior circulation: Both vertebral arteries are widely patent to the basilar. No basilar stenosis. Posterior circulation branch vessels are patent. Left PCA takes fetal origin from the anterior circulation. Venous sinuses: Patent and normal. Anatomic variants: None significant. Delayed phase: No abnormal enhancement. Review of the MIP images confirms the above findings CT Brain Perfusion Findings: CBF (<30%) Volume: 034mPerfusion (Tmax>6.0s) volume: 84m53mismatch Volume: 84mL49mfarction Location:None identified. IMPRESSION: Negative CT perfusion study. No confirmatory evidence of right basal ganglia stroke. CT angiography does not show any stenotic disease or large or medium vessel occlusion. Ordinary mild atherosclerosis. Tortuous vessels suggesting a history of hypertension. These results were communicated to Dr. ArorRory Percy10:26 amon 7/17/2019by text page via the [facility] system. Electronically Signed   By: MarkNelson Chimes.   On: 04/16/2018 10:27   Dg Chest Port 1 View  Result Date: 04/16/2018 CLINICAL DATA:  Altered mental status. EXAM: PORTABLE CHEST 1 VIEW COMPARISON:  Chest x-ray dated March 27, 2018. FINDINGS: Stable cardiomegaly. Normal pulmonary vascularity. No focal consolidation, pleural effusion, or pneumothorax. No acute osseous abnormality. IMPRESSION: Stable cardiomegaly.  No active disease. Electronically Signed   By: WillTitus Dubin.   On: 04/16/2018 11:26   Dg Swallowing Func-speech Pathology  Result Date: 04/18/2018 Objective Swallowing Evaluation: Type of Study: MBS-Modified Barium  Swallow Study  Patient Details Name: [place]: [id number] of Birth: [birth year]'s Date: 04/18/2018 Time: SLP Start Time (ACUTE ONLY): 1013 -SLP Stop Time (ACUTE ONLY): 1030 SLP Time Calculation (min) (ACUTE ONLY): 17 min Past Medical History: Past Medical History: Diagnosis Date . Anxiety  . Closed fracture of right distal femur (HCC)[place]/10/2014 . Depression  . [facility] [place] (04/24/2017) . GERD (gastroesophageal reflux disease) 04/23/2015 . HCAP (healthcare-associated pneumonia) 04/24/2017 . Heart murmur  . History of blood transfusion   "low HgB when I was going to dialysis center" . Hypertension  . Hypertension associatd with end stage renal disease on dialysis 03/11/2014 . Malnutrition of moderate degree 09/02/2015 . Multiple myeloma (HCC)[place]11/2015 . Patient is Jehovah's Witness   "I'd rather never have any blood transfusions  unless absolutely necessary; please check with me 1st". (04/24/2017) . Pneumonia ~ 2016 Past Surgical History: Past Surgical History: Procedure Laterality Date . AV FISTULA PLACEMENT Left  . I&D EXTREMITY Left 02/27/2016  Procedure: ARTHROSCOPIC IRRIGATION AND DEBRIDEMENT EXTREMITY;  Surgeon: Renette Butters, MD;  Location: [place];  Service: Orthopedics;  Laterality: Left; . TEE WITHOUT CARDIOVERSION N/A 02/29/2016  Procedure: TRANSESOPHAGEAL ECHOCARDIOGRAM (TEE);  Surgeon: Thayer Headings, MD;  Location[facility] ENDOSCOPY;  Service: Cardiovascular;  Laterality: N/A; HPI: 77 yo female adm [facility] with AMS, concern for CVA- MRI showed mild chronic small vessel disease, otherwise negative.  CXR negative 04/16/18.  Pt PMH + for ESRD on dialysis, multiple myeloma.   Subjective: pt awake in chair Assessment / Plan / Recommendation CHL IP CLINICAL IMPRESSIONS 04/18/2018 Clinical Impression Pt presents with mild oral and cervical esophageal dysphagia.  Fortunately pharyngeal swallow is intact.  NO aspiration or penetration of any consistency  tested noted.   Decreased oral coordination noted with mastication of soft solids - likely due to recent loss of lower denture - resulting in delayed mastication/oral transiting and residuals.   Pt was able to orally transit barium tablet with thin.  Single episode of belching and throat clearing noted with observation of backflow of liquid barium into lower pharynx.  Pt reflexively swallowed to clear backflowed material but this will increase her aspiration risk.  Recommend pt continue diet with precautions including drinking liquids during meal *mindful of renal restriction*, prefer small frequent meals and staying upright after meals.  Brief follow up at SNF with SLP recommended to help mitigate dysphagia.  In future, dedicated esophageal evaluation may be helpful if symptoms worsen/nutrition becomes issue.   Using live video, pt educated to findings/recommendations.   SLP Visit Diagnosis -- Attention and concentration deficit following -- Frontal lobe and executive function deficit following -- Impact on safety and function Mild aspiration risk   [facility] IP TREATMENT RECOMMENDATION 04/18/2018 Treatment Recommendations No treatment recommended at this time   Prognosis 04/18/2018 Prognosis for Safe Diet Advancement Good Barriers to Reach Goals -- Barriers/Prognosis Comment -- [facility] IP DIET RECOMMENDATION 04/18/2018 SLP Diet Recommendations Dysphagia 3 (Mech soft) solids;Thin liquid Liquid Administration via Cup;Straw Medication Administration Whole meds with liquid Compensations Slow rate;Small sips/bites Postural Changes Seated upright at 90 degrees;Remain semi-upright after after feeds/meals (Comment)   [facility] IP OTHER RECOMMENDATIONS 04/18/2018 Recommended Consults -- Oral Care Recommendations Oral care BID Other Recommendations --   [facility] IP FOLLOW UP RECOMMENDATIONS 04/18/2018 Follow up Recommendations Skilled Nursing facility   No flowsheet data found.     [facility] IP ORAL PHASE 04/18/2018 Oral Phase Impaired Oral - Pudding  Teaspoon -- Oral - Pudding Cup -- Oral - Honey Teaspoon -- Oral - Honey Cup -- Oral - Nectar Teaspoon -- Oral - Nectar Cup WFL Oral - Nectar Straw -- Oral - Thin Teaspoon WFL Oral - Thin Cup WFL Oral - Thin Straw WFL Oral - Puree WFL Oral - Mech Soft Impaired mastication;Lingual/palatal residue Oral - Regular -- Oral - Multi-Consistency -- Oral - Pill WFL Oral Phase - Comment --  [facility] IP PHARYNGEAL PHASE 04/18/2018 Pharyngeal Phase WFL Pharyngeal- Pudding Teaspoon -- Pharyngeal -- Pharyngeal- Pudding Cup -- Pharyngeal -- Pharyngeal- Honey Teaspoon -- Pharyngeal -- Pharyngeal- Honey Cup -- Pharyngeal -- Pharyngeal- Nectar Teaspoon -- Pharyngeal -- Pharyngeal- Nectar Cup -- Pharyngeal -- Pharyngeal- Nectar Straw -- Pharyngeal -- Pharyngeal- Thin Teaspoon -- Pharyngeal -- Pharyngeal- Thin Cup -- Pharyngeal -- Pharyngeal- Thin Straw -- Pharyngeal -- Pharyngeal- Puree --  Pharyngeal -- Pharyngeal- Mechanical Soft -- Pharyngeal -- Pharyngeal- Regular -- Pharyngeal -- Pharyngeal- Multi-consistency -- Pharyngeal -- Pharyngeal- Pill -- Pharyngeal -- Pharyngeal Comment --  [facility] IP CERVICAL ESOPHAGEAL PHASE 04/18/2018 Cervical Esophageal Phase Impaired Pudding Teaspoon -- Pudding Cup -- Honey Teaspoon -- Honey Cup -- Nectar Teaspoon -- Nectar Cup -- Nectar Straw -- Thin Teaspoon -- Thin Cup -- Thin Straw -- Puree -- Mechanical Soft -- Regular -- Multi-consistency -- Pill -- Cervical Esophageal Comment appearance of backflow x1 of liquids post-swallow to pharynx, pt re-swallowed to clear; at completion of mbs after pill administered,  pt appeared with barium residuals in esophagus without sensation - water intake appeared to clear - radiologist not present to confirm No flowsheet data found. [name] 04/18/2018, 10:45 AM  Luanna Salk, MS [facility] SLP [phone]             US Soft Tissue Neck  Result Date: 04/18/2018 CLINICAL DATA:  77 year old with neck swelling. Area of concern is near the midline. EXAM: ULTRASOUND OF  HEAD/NECK SOFT TISSUES TECHNIQUE: Ultrasound examination of the head and neck soft tissues was performed in the area of clinical concern. COMPARISON:  Neck CTA 04/16/2018 FINDINGS: Evidence for mild subcutaneous edema. No focal soft tissue or cystic lesion at the area of concern. Patient has prominent venous structures along the midline of the neck based on the recent neck CT. IMPRESSION: No focal abnormality at the area of concern. Mild subcutaneous edema. Electronically Signed   By: Markus Daft M.D.   On: 04/18/2018 12:23   Ct Head Code Stroke Wo Contrast  Result Date: 04/16/2018 CLINICAL DATA:  Code stroke. Slurred speech. Altered level of consciousness. Last seen normal 2100 hours. EXAM: CT HEAD WITHOUT CONTRAST TECHNIQUE: Contiguous axial images were obtained from the base of the skull through the vertex without intravenous contrast. COMPARISON:  02/20/2018 FINDINGS: Brain: Mild generalized atrophy. Chronic small-vessel ischemic changes of the hemispheric white matter. Slightly more prominent focal low density in the right basal ganglia/anterior limb of the internal capsule which could be an acute infarction. No evidence of large vessel territory infarction. No mass lesion, hemorrhage, hydrocephalus or extra-axial collection. Vascular: There is atherosclerotic calcification of the major vessels at the base of the brain. Skull: Scattered lucencies within the calvarium consistent with the clinical history of myeloma. These are slightly progressive since the prior study. Sinuses/Orbits: Normal Other: None ASPECTS ([place] Stroke Program Early CT Score) - Ganglionic level infarction (caudate, lentiform nuclei, internal capsule, insula, M1-M3 cortex): 6 - Supraganglionic infarction (M4-M6 cortex): 3 Total score (0-10 with 10 being normal): 9 IMPRESSION: 1. Background pattern of atrophy and chronic small-vessel ischemic change. More prominent low-density in the basal ganglia/anterior limb internal capsule on the  right suggesting an acute infarction in that location. 2. ASPECTS is 9. 3. Slightly progressive calvarial lucencies consistent with the clinical history of myeloma. 4. These results were communicated to Dr. Rory Percy at 9:43 amon 7/17/2019by text page via the [facility] messaging system. Electronically Signed   By: Nelson Chimes M.D.   On: 04/16/2018 09:45    Assessment/Plan  1. Acute encephalopathy -  MRI of the brain did not show any acute intracranial abnormalities and CT imaging did not show any obstruction or stenosis in the vessels, altered mental status was thought to be secondary to uremia. She had hemodialysis with significant improvement  2. ESRD (end stage renal disease) on dialysis ([place]) - continue hemodialysis on TTS, Sevelamer 800 mg 1 tab TID   3. Essential  hypertension - will continue Clonidine 0.1 mg 1 tab Q 8 hours PRN,  Amlodipine 10 mg daily, Losartan 50 mg Q HS   4. Anxiety and depression - upset over the phone today, will continue Sertraline 50 mg daily, Lamotrigine 25 mg 2 tabs daily, Ativan 0.5 mg Q HS   5. Thrombocytopenia ([place]) - secondary to multiple myeloma, will monitor Lab Results  Component Value Date   PLT 123 (L) 04/18/2018     6. Multiple myeloma, remission status unspecified ([place]) - goes to [facility] outpatient for chemotherapy, will re-start Valtrex 500 mg 1 tab Q other day for prophylaxis   7. Dysphagia, unspecified type - Modified barium swallow was done and recommended mechanical soft diet, ultrasound of the neck did not show anything significant   8. Diarrhea -  Will change Senexon-S to PRN  9. CAD - no complaints of chest pains, continue Isosorbide MN ER 60 mg daily    Family/ staff Communication: Discussed plan of care with resident.  Labs/tests ordered:  None  Goals of care:   Long-term care   Durenda Age, NP [facility] [phone] (Monday-Friday 8:00 a.m. - 5:00 p.m.) [phone] (after hours)

## 2018-04-24 LAB — CBC AND DIFFERENTIAL
HEMATOCRIT: 26 — AB (ref 36–46)
Hemoglobin: 8.6 — AB (ref 12.0–16.0)
Neutrophils Absolute: 2
Platelets: 148 — AB (ref 150–399)
WBC: 3.3

## 2018-04-24 LAB — BASIC METABOLIC PANEL
BUN: 38 — AB (ref 4–21)
Creatinine: 7.1 — AB (ref 0.5–1.1)
GLUCOSE: 80
POTASSIUM: 4.4 (ref 3.4–5.3)
SODIUM: 134 — AB (ref 137–147)

## 2018-04-28 ENCOUNTER — Encounter: Payer: Self-pay | Admitting: Adult Health

## 2018-04-28 ENCOUNTER — Non-Acute Institutional Stay (SKILLED_NURSING_FACILITY): Payer: Medicare Other | Admitting: Adult Health

## 2018-04-28 DIAGNOSIS — Z992 Dependence on renal dialysis: Secondary | ICD-10-CM

## 2018-04-28 DIAGNOSIS — F329 Major depressive disorder, single episode, unspecified: Secondary | ICD-10-CM

## 2018-04-28 DIAGNOSIS — C9000 Multiple myeloma not having achieved remission: Secondary | ICD-10-CM | POA: Diagnosis not present

## 2018-04-28 DIAGNOSIS — I10 Essential (primary) hypertension: Secondary | ICD-10-CM

## 2018-04-28 DIAGNOSIS — N186 End stage renal disease: Secondary | ICD-10-CM | POA: Diagnosis not present

## 2018-04-28 DIAGNOSIS — K219 Gastro-esophageal reflux disease without esophagitis: Secondary | ICD-10-CM

## 2018-04-28 DIAGNOSIS — F419 Anxiety disorder, unspecified: Secondary | ICD-10-CM

## 2018-04-28 NOTE — Progress Notes (Signed)
Location:  [place] Room Number: 109-A Place of Service:  SNF (31) Provider:  Durenda Age, NP  Patient Care Team: Hendricks Limes, MD as PCP - General (Internal Medicine) Medina-Vargas, Senaida Lange, NP as Nurse Practitioner (Internal Medicine)  Extended Emergency Contact Information Primary Emergency Contact: Wilburn Mylar [place] of [place] Phone: [phone] Mobile Phone: [phone] Relation: Daughter Secondary Emergency Contact: Odie Sera [place] of [place] Mobile Phone: [phone] Relation: Brother  Code Status:  Full Code  Goals of care: Advanced Directive information Advanced Directives 06/26/2017  Does Patient Have a Medical Advance Directive? No  Type of Advance Directive -  Does patient want to make changes to medical advance directive? -  Copy of [place] in Chart? -  Would patient like information on creating a medical advance directive? -     Chief Complaint  Patient presents with  . Medical Management of Chronic Issues    The patient is seen for a routine [facility] visit    HPI:  Pt is a 77 y.o. female seen today for medical management of chronic diseases.  She is a long-term care resident of [facility].  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 She was seen in her room today. She denies having diarrhea today. She is getting ready to go to a bible study. She verbalized having more energy today.   Past Medical History:  Diagnosis Date  . Anxiety   . Closed fracture of right distal femur ([place]) 09/01/2015  . Depression   . ESRD (end stage renal disease) on dialysis [facility])    "TTS; [place]" (04/24/2017)  . GERD (gastroesophageal reflux disease) 04/23/2015  . HCAP (healthcare-associated pneumonia) 04/24/2017  . Heart murmur   . History of blood transfusion    "low  HgB when I was going to dialysis center"  . Hypertension   . Hypertension associatd with end stage renal disease on dialysis 03/11/2014  . Malnutrition of moderate degree 09/02/2015  . Multiple myeloma ([place]) 03/11/2014  . Patient is Jehovah's Witness    "I'd rather never have any blood transfusions unless absolutely necessary; please check with me 1st". (04/24/2017)  . Pneumonia ~ 2016   Past Surgical History:  Procedure Laterality Date  . AV FISTULA PLACEMENT Left   . I&D EXTREMITY Left 02/27/2016   Procedure: ARTHROSCOPIC IRRIGATION AND DEBRIDEMENT EXTREMITY;  Surgeon: Renette Butters, MD;  Location: [facility];  Service: Orthopedics;  Laterality: Left;  . TEE WITHOUT CARDIOVERSION N/A 02/29/2016   Procedure: TRANSESOPHAGEAL ECHOCARDIOGRAM (TEE);  Surgeon: Thayer Headings, MD;  Location: [facility] ENDOSCOPY;  Service: Cardiovascular;  Laterality: N/A;    No Known Allergies  Outpatient Encounter Medications as of 04/28/2018  Medication Sig  . amLODipine (NORVASC) 10 MG tablet Take 10 mg by mouth daily. Hold for SBP <100MM/HG  . bisacodyl (BISAC-EVAC) 10 MG suppository Place 10 mg rectally once as needed for moderate constipation (FOR CONSTIPATION NOT RELIEVED BY MILK OF MAGNESIA).   . calcium acetate (PHOSLO) 667 MG capsule Take 2,001 mg by mouth 3 (three) times daily.   . cloNIDine (CATAPRES) 0.1 MG tablet Take 0.1 mg by mouth every 8 (eight) hours as needed (for a systolic B/P of 071 or greater).   . hydrocortisone cream 1 % Apply 1 application topically every 8 (eight) hours as needed for itching. Apply to rectum for hemorrhoids  . isosorbide mononitrate (IMDUR) 60  MG 24 hr tablet Take 60 mg by mouth daily.   Marland Kitchen lamoTRIgine (LAMICTAL) 25 MG tablet Take 50 mg by mouth daily.   Marland Kitchen latanoprost (XALATAN) 0.005 % ophthalmic solution Place 1 drop into both eyes at bedtime.   Marland Kitchen loperamide (IMODIUM A-D) 2 MG tablet Take 4 mg by mouth as needed for diarrhea or loose stools. The patient requests this for  dialysis days.  Marland Kitchen LORazepam (ATIVAN) 0.5 MG tablet Take 1 tablet (0.5 mg total) by mouth at bedtime.  Marland Kitchen losartan (COZAAR) 50 MG tablet Take 50 mg by mouth at bedtime.  . multivitamin (RENA-VIT) TABS tablet Take 1 tablet by mouth daily.  . ondansetron (ZOFRAN) 4 MG tablet Take 4 mg by mouth every 8 (eight) hours as needed for nausea or vomiting.  . promethazine (PHENERGAN) 25 MG tablet Take 25 mg by mouth every 6 (six) hours as needed for nausea or vomiting.  . ranitidine (ZANTAC) 300 MG tablet Take 300 mg by mouth at bedtime. Reported on 03/05/2016  . senna-docusate (SENEXON-S) 8.6-50 MG tablet Take 2 tablets by mouth at bedtime as needed.   . sertraline (ZOLOFT) 50 MG tablet Take 50 mg by mouth daily.   . sevelamer carbonate (RENVELA) 800 MG tablet Take 800 mg by mouth 3 (three) times daily with meals.   . simethicone (MYLICON) 553 MG chewable tablet Chew 125 mg by mouth 3 (three) times daily. Take 1 tablet by mouth prior to each meal at 8AM, 11AM, and 6PM  . valACYclovir (VALTREX) 500 MG tablet Take 1 tablet (500 mg total) by mouth every other day.   No facility-administered encounter medications on file as of 04/28/2018.     Review of Systems  GENERAL: No change in appetite, no fatigue, no weight changes, no fever, chills or weakness MOUTH and THROAT: Denies oral discomfort, gingival pain or bleeding RESPIRATORY: no cough, SOB, DOE, wheezing, hemoptysis CARDIAC: No chest pain, edema or palpitations GI: No abdominal pain, diarrhea, constipation, heart burn, nausea or vomiting GU: Denies dysuria, frequency, hematuria, or discharge PSYCHIATRIC: Denies feelings of depression or anxiety. No report of hallucinations, insomnia, paranoia, or agitation   Immunization History  Administered Date(s) Administered  . Influenza,inj,Quad PF,6+ Mos 07/23/2014, 08/01/2016  . Influenza,inj,quad, With Preservative 08/07/2017  . PPD Test 04/13/2015   Pertinent  Health Maintenance Due  Topic Date Due  .  INFLUENZA VACCINE  07/31/2018 (Originally 05/01/2018)  . PNA vac Low Risk Adult (1 of 2 - PCV13) 08/29/2018 (Originally 03/20/2004)  . DEXA SCAN  11/28/2018 (Originally 03/20/2004)   Fall Risk  06/06/2017 11/09/2016 06/15/2016 05/25/2016 05/23/2016  Falls in the past year? _0   Number falls in past yr: - - - - -  Injury with Fall? - - - - -  Risk Factor Category  - - - - -  Risk for fall due to : - - - - -  Risk for fall due to: Comment - - - - -  Follow up - - - - -    Vitals:   04/28/18 0937  BP: 139/72  Pulse: 82  Resp: 20  Temp: 97.9 F (36.6 C)  TempSrc: Oral  SpO2: 98%  Weight: 140 lb 4.8 oz (63.6 kg)  Height: _1  (1.676 m)   Body mass index is 22.65 kg/m.  Physical Exam  GENERAL APPEARANCE: Well nourished. In no acute distress. Normal body habitus SKIN:  Skin is warm and dry.  MOUTH and THROAT: Lips are without lesions. Oral mucosa  is moist and without lesions. Tongue is normal in shape, size, and color and without lesions RESPIRATORY: Breathing is even & unlabored, BS CTAB CARDIAC: RRR, + murmur,no extra heart sounds, no edema, left upper arm AV fistula + bruit/thrill GI: Abdomen soft, normal BS, no masses, no tenderness EXTREMITIES:  Able to move X 4 extremities NEUROLOGICAL: There is no tremor. Speech is clear PSYCHIATRIC: Alert and oriented X 3. Affect and behavior are appropriate   Labs reviewed: Recent Labs    04/16/18 0929 04/16/18 0935 04/16/18 1443 04/17/18 0610 04/18/18 0330  NA 134* 136  --  136 135  K 3.9 3.8  --  4.3 3.9  CL 95* 98  --  99 96*  CO2 24  --   --  24 28  GLUCOSE 96 101*  --  85 57*  BUN 51* 52*  --  59* 23  CREATININE 10.96* 12.60* 11.42* 12.59* 7.64*  CALCIUM 9.9  --   --  9.5 8.9   Recent Labs    02/20/18 2030 03/27/18 2235 04/16/18 0929  AST _0 ALT 12* 16 12  ALKPHOS 46 34* 42  BILITOT 0.7 0.4 0.8  PROT 9.5* 8.3* 9.5*  ALBUMIN 3.1* 2.8* 3.8   Recent Labs    03/27/18 2235 04/16/18 0929   04/16/18 1443 04/17/18 0610 04/18/18 0330  WBC 5.5 3.1*  --  2.6* 2.7* 2.6*  NEUTROABS 4.7 1.8  --   --   --  1.7  HGB 8.3* 10.5*   < > 10.0* 9.9* 9.3*  HCT 27.4* 34.8*   < > 32.3* 31.2* 30.1*  MCV 114.6* 113.0*  --  111.4* 108.7* 111.5*  PLT 72* 88*  --  103* 99* 123*   < > = values in this interval not displayed.   Lab Results  Component Value Date   TSH 2.306 04/16/2018   Lab Results  Component Value Date   HGBA1C 4.9 03/13/2016   Lab Results  Component Value Date   CHOL 202 (A) 11/27/2017   HDL 77 (A) 11/27/2017   LDLCALC 112 11/27/2017   TRIG 65 11/27/2017    Significant Diagnostic Results in last 30 days:  Ct Angio Head W Or Wo Contrast  Result Date: 04/16/2018 CLINICAL DATA:  Slurred speech. Altered level of consciousness. Last seen normal 2100 hours. Possible right basal ganglia infarction by CT. EXAM: CT ANGIOGRAPHY HEAD AND NECK CT PERFUSION BRAIN TECHNIQUE: Multidetector CT imaging of the head and neck was performed using the standard protocol during bolus administration of intravenous contrast. Multiplanar CT image reconstructions and MIPs were obtained to evaluate the vascular anatomy. Carotid stenosis measurements (when applicable) are obtained utilizing NASCET criteria, using the distal internal carotid diameter as the denominator. Multiphase CT imaging of the brain was performed following IV bolus contrast injection. Subsequent parametric perfusion maps were calculated using RAPID software. CONTRAST:  9m ISOVUE-370 IOPAMIDOL (ISOVUE-370) INJECTION 76% COMPARISON:  CT same day. FINDINGS: CTA NECK FINDINGS Aortic arch: Aortic atherosclerosis. No aneurysm or dissection. Branching pattern of the brachiocephalic vessels is normal without origin stenosis. Right carotid system: Common carotid artery is tortuous but widely patent to the bifurcation. There is minor plaque at the carotid bifurcation but no stenosis. Cervical ICA is tortuous but widely patent. Left carotid  system: Common carotid artery is widely patent to the bifurcation. Calcified plaque at the bifurcation and ICA bulb but no stenosis or irregularity. Cervical ICA is tortuous but widely patent. Vertebral arteries: Right vertebral artery is dominant. Both  vertebral artery origins are widely patent. Both vertebral arteries are widely patent through the cervical region to the foramen magnum. Skeleton: Ordinary spondylosis. Other neck: No mass or lymphadenopathy. Upper chest: Negative. Somewhat unusual collateral vein bulging into the suprasternal notch. Review of the MIP images confirms the above findings CTA HEAD FINDINGS Anterior circulation: Both internal carotid arteries are patent through the skull base and siphon regions. There is ordinary atherosclerotic calcification in the carotid siphons but no stenosis. The anterior and middle cerebral vessels are patent without proximal stenosis, aneurysm or vascular malformation. No missing vessels are identified. Posterior circulation: Both vertebral arteries are widely patent to the basilar. No basilar stenosis. Posterior circulation branch vessels are patent. Left PCA takes fetal origin from the anterior circulation. Venous sinuses: Patent and normal. Anatomic variants: None significant. Delayed phase: No abnormal enhancement. Review of the MIP images confirms the above findings CT Brain Perfusion Findings: CBF (<30%) Volume: 72m Perfusion (Tmax>6.0s) volume: 025mMismatch Volume: 41m66mnfarction Location:None identified. IMPRESSION: Negative CT perfusion study. No confirmatory evidence of right basal ganglia stroke. CT angiography does not show any stenotic disease or large or medium vessel occlusion. Ordinary mild atherosclerosis. Tortuous vessels suggesting a history of hypertension. These results were communicated to Dr. AroRory Percy 10:26 amon 7/17/2019by text page via the AM[facility]ssaging system. Electronically Signed   By: MarNelson ChimesD.   On: 04/16/2018 10:27   Ct  Angio Neck W Or Wo Contrast  Result Date: 04/16/2018 CLINICAL DATA:  Slurred speech. Altered level of consciousness. Last seen normal 2100 hours. Possible right basal ganglia infarction by CT. EXAM: CT ANGIOGRAPHY HEAD AND NECK CT PERFUSION BRAIN TECHNIQUE: Multidetector CT imaging of the head and neck was performed using the standard protocol during bolus administration of intravenous contrast. Multiplanar CT image reconstructions and MIPs were obtained to evaluate the vascular anatomy. Carotid stenosis measurements (when applicable) are obtained utilizing NASCET criteria, using the distal internal carotid diameter as the denominator. Multiphase CT imaging of the brain was performed following IV bolus contrast injection. Subsequent parametric perfusion maps were calculated using RAPID software. CONTRAST:  44m25mOVUE-370 IOPAMIDOL (ISOVUE-370) INJECTION 76% COMPARISON:  CT same day. FINDINGS: CTA NECK FINDINGS Aortic arch: Aortic atherosclerosis. No aneurysm or dissection. Branching pattern of the brachiocephalic vessels is normal without origin stenosis. Right carotid system: Common carotid artery is tortuous but widely patent to the bifurcation. There is minor plaque at the carotid bifurcation but no stenosis. Cervical ICA is tortuous but widely patent. Left carotid system: Common carotid artery is widely patent to the bifurcation. Calcified plaque at the bifurcation and ICA bulb but no stenosis or irregularity. Cervical ICA is tortuous but widely patent. Vertebral arteries: Right vertebral artery is dominant. Both vertebral artery origins are widely patent. Both vertebral arteries are widely patent through the cervical region to the foramen magnum. Skeleton: Ordinary spondylosis. Other neck: No mass or lymphadenopathy. Upper chest: Negative. Somewhat unusual collateral vein bulging into the suprasternal notch. Review of the MIP images confirms the above findings CTA HEAD FINDINGS Anterior circulation: Both  internal carotid arteries are patent through the skull base and siphon regions. There is ordinary atherosclerotic calcification in the carotid siphons but no stenosis. The anterior and middle cerebral vessels are patent without proximal stenosis, aneurysm or vascular malformation. No missing vessels are identified. Posterior circulation: Both vertebral arteries are widely patent to the basilar. No basilar stenosis. Posterior circulation branch vessels are patent. Left PCA takes fetal origin from the anterior circulation. Venous sinuses: Patent and  normal. Anatomic variants: None significant. Delayed phase: No abnormal enhancement. Review of the MIP images confirms the above findings CT Brain Perfusion Findings: CBF (<30%) Volume: 57m Perfusion (Tmax>6.0s) volume: 051mMismatch Volume: 41m73mnfarction Location:None identified. IMPRESSION: Negative CT perfusion study. No confirmatory evidence of right basal ganglia stroke. CT angiography does not show any stenotic disease or large or medium vessel occlusion. Ordinary mild atherosclerosis. Tortuous vessels suggesting a history of hypertension. These results were communicated to Dr. AroRory Percy 10:26 amon 7/17/2019by text page via the AMI[facility] Hospitalssaging system. Electronically Signed   By: MarNelson ChimesD.   On: 04/16/2018 10:27   Mr Brain Wo Contrast  Result Date: 04/16/2018 CLINICAL DATA:  Speech difficulty. Encephalopathy. Weakness. History of end-stage renal disease and multiple myeloma. EXAM: MRI HEAD WITHOUT CONTRAST TECHNIQUE: Multiplanar, multiecho pulse sequences of the brain and surrounding structures were obtained without intravenous contrast. COMPARISON:  Head CT 04/16/2018 FINDINGS: The examination had to be discontinued prior to completion due to patient's altered mental status. A complete noncontrast examination was obtained with the exception of sagittal and axial T1 weighted imaging. The study is motion degraded throughout, with moderate to severe motion on  the axial FLAIR and coronal T2 sequences. Brain: There is no evidence of acute infarct, mass, midline shift, or extra-axial fluid collection. A single focus of chronic microhemorrhage is noted in the posteromedial right temporal lobe. There is mild cerebral atrophy. Patchy T2 hyperintensities are present in the cerebral white matter, nonspecific but compatible with mild chronic small vessel ischemic disease. Chronic lacunar infarcts are present in the left caudate nucleus, left thalamus, and left cerebellum. Vascular: Major intracranial vascular flow voids are preserved. Skull and upper cervical spine: Diffuse calvarial marrow heterogeneity which likely reflects patient's history of myeloma. Sinuses/Orbits: Unremarkable orbits. Paranasal sinuses and mastoid air cells are clear. Other: None. IMPRESSION: 1. Incomplete, motion degraded examination. 2. No acute infarct or other acute intracranial abnormality identified. 3. Mild chronic small vessel ischemic disease with chronic lacunar infarcts as above. Electronically Signed   By: AllLogan BoresD.   On: 04/16/2018 15:59   Ct Cerebral Perfusion W Contrast  Result Date: 04/16/2018 CLINICAL DATA:  Slurred speech. Altered level of consciousness. Last seen normal 2100 hours. Possible right basal ganglia infarction by CT. EXAM: CT ANGIOGRAPHY HEAD AND NECK CT PERFUSION BRAIN TECHNIQUE: Multidetector CT imaging of the head and neck was performed using the standard protocol during bolus administration of intravenous contrast. Multiplanar CT image reconstructions and MIPs were obtained to evaluate the vascular anatomy. Carotid stenosis measurements (when applicable) are obtained utilizing NASCET criteria, using the distal internal carotid diameter as the denominator. Multiphase CT imaging of the brain was performed following IV bolus contrast injection. Subsequent parametric perfusion maps were calculated using RAPID software. CONTRAST:  38m47mOVUE-370 IOPAMIDOL  (ISOVUE-370) INJECTION 76% COMPARISON:  CT same day. FINDINGS: CTA NECK FINDINGS Aortic arch: Aortic atherosclerosis. No aneurysm or dissection. Branching pattern of the brachiocephalic vessels is normal without origin stenosis. Right carotid system: Common carotid artery is tortuous but widely patent to the bifurcation. There is minor plaque at the carotid bifurcation but no stenosis. Cervical ICA is tortuous but widely patent. Left carotid system: Common carotid artery is widely patent to the bifurcation. Calcified plaque at the bifurcation and ICA bulb but no stenosis or irregularity. Cervical ICA is tortuous but widely patent. Vertebral arteries: Right vertebral artery is dominant. Both vertebral artery origins are widely patent. Both vertebral arteries are widely patent through the cervical region to  the foramen magnum. Skeleton: Ordinary spondylosis. Other neck: No mass or lymphadenopathy. Upper chest: Negative. Somewhat unusual collateral vein bulging into the suprasternal notch. Review of the MIP images confirms the above findings CTA HEAD FINDINGS Anterior circulation: Both internal carotid arteries are patent through the skull base and siphon regions. There is ordinary atherosclerotic calcification in the carotid siphons but no stenosis. The anterior and middle cerebral vessels are patent without proximal stenosis, aneurysm or vascular malformation. No missing vessels are identified. Posterior circulation: Both vertebral arteries are widely patent to the basilar. No basilar stenosis. Posterior circulation branch vessels are patent. Left PCA takes fetal origin from the anterior circulation. Venous sinuses: Patent and normal. Anatomic variants: None significant. Delayed phase: No abnormal enhancement. Review of the MIP images confirms the above findings CT Brain Perfusion Findings: CBF (<30%) Volume: 27m Perfusion (Tmax>6.0s) volume: 094mMismatch Volume: 26m89mnfarction Location:None identified. IMPRESSION:  Negative CT perfusion study. No confirmatory evidence of right basal ganglia stroke. CT angiography does not show any stenotic disease or large or medium vessel occlusion. Ordinary mild atherosclerosis. Tortuous vessels suggesting a history of hypertension. These results were communicated to Dr. AroRory Percy 10:26 amon 7/17/2019by text page via the AMI[facility]aging system. Electronically Signed   By: MarNelson ChimesD.   On: 04/16/2018 10:27   Dg Chest Port 1 View  Result Date: 04/16/2018 CLINICAL DATA:  Altered mental status. EXAM: PORTABLE CHEST 1 VIEW COMPARISON:  Chest x-ray dated March 27, 2018. FINDINGS: Stable cardiomegaly. Normal pulmonary vascularity. No focal consolidation, pleural effusion, or pneumothorax. No acute osseous abnormality. IMPRESSION: Stable cardiomegaly.  No active disease. Electronically Signed   By: WilTitus DubinD.   On: 04/16/2018 11:26   Dg Swallowing Func-speech Pathology  Result Date: 04/18/2018 Objective Swallowing Evaluation: Type of Study: MBS-Modified Barium Swallow Study  Patient Details Name: BerLaterica MatarazzoN: [id number] of Birth: 6/2May 07, 1942day's Date: 04/18/2018 Time: SLP Start Time (ACUTE ONLY): 1013 -SLP Stop Time (ACUTE ONLY): 1030 SLP Time Calculation (min) (ACUTE ONLY): 17 min Past Medical History: Past Medical History: Diagnosis Date . Anxiety  . Cl[place] of right distal femur ([place] . Depression  . ESRD (end stage renal disease) [facility] [place]" (04/24/2017) . GERD (gastroesophageal reflux disease) 04/23/2015 . HCAP (healthcare-associated pneumonia) 04/24/2017 . Heart murmur  . History of blood transfusion   "low HgB when I was going to dialysis center" . Hypertension  . Hypertension associatd with end stage renal disease on dialysis 03/11/2014 . Malnutrition of moderate degree 09/02/2015 . Multiple myeloma ([place]/08/2014 . Patient is Jehovah's Witness   "I'd rather never have any blood transfusions unless absolutely  necessary; please check with me 1st". (04/24/2017) . Pneumonia ~ 2016 Past Surgical History: Past Surgical History: Procedure Laterality Date . AV FISTULA PLACEMENT Left  . I&D EXTREMITY Left 02/27/2016  Procedure: ARTHROSCOPIC IRRIGATION AND DEBRIDEMENT EXTREMITY;  Surgeon: TimRenette ButtersD;  [facility]  Laterality: Left; . TEE WITHOUT CARDIOVERSION N/A 02/29/2016  Procedure: TRANSESOPHAGEAL ECHOCARDIOGRAM (TEE);  Surgeon: PhiThayer HeadingsD;  [facility];  Service: Cardiovascular;  Laterality: N/A; HPI: [id number] [facility], concern for CVA- MRI showed mild chronic small vessel disease, otherwise negative.  CXR negative 04/16/18.  Pt PMH + for ESRD on dialysis, multiple myeloma.   Subjective: pt awake in chair Assessment / Plan / Recommendation CHL IP CLINICAL IMPRESSIONS 04/18/2018 Clinical Impression Pt presents with mild oral and cervical  esophageal dysphagia.  Fortunately pharyngeal swallow is intact.  NO aspiration or penetration of any consistency tested noted.   Decreased oral coordination noted with mastication of soft solids - likely due to recent loss of lower denture - resulting in delayed mastication/oral transiting and residuals.   Pt was able to orally transit barium tablet with thin.  Single episode of belching and throat clearing noted with observation of backflow of liquid barium into lower pharynx.  Pt reflexively swallowed to clear backflowed material but this will increase her aspiration risk.  Recommend pt continue diet with precautions including drinking liquids during meal *mindful of renal restriction*, prefer small frequent meals and staying upright after meals.  Brief follow up at SNF with SLP recommended to help mitigate dysphagia.  In future, dedicated esophageal evaluation may be helpful if symptoms worsen/nutrition becomes issue.   Using live video, pt educated to findings/recommendations.   SLP Visit Diagnosis -- Attention and  concentration deficit following -- Frontal lobe and executive function deficit following -- Impact on safety and function Mild aspiration risk   CHL IP TREATMENT RECOMMENDATION 04/18/2018 Treatment Recommendations No treatment recommended at this time   Prognosis 04/18/2018 Prognosis for Safe Diet Advancement Good Barriers to Reach Goals -- Barriers/Prognosis Comment -- CHL IP DIET RECOMMENDATION 04/18/2018 SLP Diet Recommendations Dysphagia 3 (Mech soft) solids;Thin liquid Liquid Administration via Cup;Straw Medication Administration Whole meds with liquid Compensations Slow rate;Small sips/bites Postural Changes Seated upright at 90 degrees;Remain semi-upright after after feeds/meals (Comment)   CHL IP OTHER RECOMMENDATIONS 04/18/2018 Recommended Consults -- Oral Care Recommendations Oral care BID Other Recommendations --   CHL IP FOLLOW UP RECOMMENDATIONS 04/18/2018 Follow up Recommendations Skilled Nursing facility   No flowsheet data found.     CHL IP ORAL PHASE 04/18/2018 Oral Phase Impaired Oral - Pudding Teaspoon -- Oral - Pudding Cup -- Oral - Honey Teaspoon -- Oral - Honey Cup -- Oral - Nectar Teaspoon -- Oral - Nectar Cup WFL Oral - Nectar Straw -- Oral - Thin Teaspoon WFL Oral - Thin Cup WFL Oral - Thin Straw WFL Oral - Puree WFL Oral - Mech Soft Impaired mastication;Lingual/palatal residue Oral - Regular -- Oral - Multi-Consistency -- Oral - Pill WFL Oral Phase - Comment --  CHL IP PHARYNGEAL PHASE 04/18/2018 Pharyngeal Phase WFL Pharyngeal- Pudding Teaspoon -- Pharyngeal -- Pharyngeal- Pudding Cup -- Pharyngeal -- Pharyngeal- Honey Teaspoon -- Pharyngeal -- Pharyngeal- Honey Cup -- Pharyngeal -- Pharyngeal- Nectar Teaspoon -- Pharyngeal -- Pharyngeal- Nectar Cup -- Pharyngeal -- Pharyngeal- Nectar Straw -- Pharyngeal -- Pharyngeal- Thin Teaspoon -- Pharyngeal -- Pharyngeal- Thin Cup -- Pharyngeal -- Pharyngeal- Thin Straw -- Pharyngeal -- Pharyngeal- Puree -- Pharyngeal -- Pharyngeal- Mechanical Soft --  Pharyngeal -- Pharyngeal- Regular -- Pharyngeal -- Pharyngeal- Multi-consistency -- Pharyngeal -- Pharyngeal- Pill -- Pharyngeal -- Pharyngeal Comment --  CHL IP CERVICAL ESOPHAGEAL PHASE 04/18/2018 Cervical Esophageal Phase Impaired Pudding Teaspoon -- Pudding Cup -- Honey Teaspoon -- Honey Cup -- Nectar Teaspoon -- Nectar Cup -- Nectar Straw -- Thin Teaspoon -- Thin Cup -- Thin Straw -- Puree -- Mechanical Soft -- Regular -- Multi-consistency -- Pill -- Cervical Esophageal Comment appearance of backflow x1 of liquids post-swallow to pharynx, pt re-swallowed to clear; at completion of mbs after pill administered,  pt appeared with barium residuals in esophagus without sensation - water intake appeared to clear - radiologist not present to confirm No flowsheet data found. [name] 04/18/2018, 10:45 AM  Luanna Salk, [facility] SLP [phone]  US Soft Tissue Neck  Result Date: 04/18/2018 CLINICAL DATA:  77 year old with neck swelling. Area of concern is near the midline. EXAM: ULTRASOUND OF HEAD/NECK SOFT TISSUES TECHNIQUE: Ultrasound examination of the head and neck soft tissues was performed in the area of clinical concern. COMPARISON:  Neck CTA 04/16/2018 FINDINGS: Evidence for mild subcutaneous edema. No focal soft tissue or cystic lesion at the area of concern. Patient has prominent venous structures along the midline of the neck based on the recent neck CT. IMPRESSION: No focal abnormality at the area of concern. Mild subcutaneous edema. Electronically Signed   By: Markus Daft M.D.   On: 04/18/2018 12:23   Ct Head Code Stroke Wo Contrast  Result Date: 04/16/2018 CLINICAL DATA:  Code stroke. Slurred speech. Altered level of consciousness. Last seen normal 2100 hours. EXAM: CT HEAD WITHOUT CONTRAST TECHNIQUE: Contiguous axial images were obtained from the base of the skull through the vertex without intravenous contrast. COMPARISON:  02/20/2018 FINDINGS: Brain: Mild generalized atrophy.  Chronic small-vessel ischemic changes of the hemispheric white matter. Slightly more prominent focal low density in the right basal ganglia/anterior limb of the internal capsule which could be an acute infarction. No evidence of large vessel territory infarction. No mass lesion, hemorrhage, hydrocephalus or extra-axial collection. Vascular: There is atherosclerotic calcification of the major vessels at the base of the brain. Skull: Scattered lucencies within the calvarium consistent with the clinical history of myeloma. These are slightly progressive since the prior study. Sinuses/Orbits: Normal Other: None ASPECTS ([place] Stroke Program Early CT Score) - Ganglionic level infarction (caudate, lentiform nuclei, internal capsule, insula, M1-M3 cortex): 6 - Supraganglionic infarction (M4-M6 cortex): 3 Total score (0-10 with 10 being normal): 9 IMPRESSION: 1. Background pattern of atrophy and chronic small-vessel ischemic change. More prominent low-density in the basal ganglia/anterior limb internal capsule on the right suggesting an acute infarction in that location. 2. ASPECTS is 9. 3. Slightly progressive calvarial lucencies consistent with the clinical history of myeloma. 4. These results were communicated to Dr. Rory Percy at 9:43 amon 7/17/2019by text page via the [facility] messaging system. Electronically Signed   By: Nelson Chimes M.D.   On: 04/16/2018 09:45    Assessment/Plan  1. Hypertension, uncontrolled -  Stable, continue Amlodipine 10 mg 1 tab daily, Losartan 50 mg daily, and Clonidine 0.1 mg 1 tab Q 8 hours PRN for SBP >180, and Isosorbide MN ER 60 mg daily   2. Multiple myeloma, remission status unspecified ([place]) - currently having chemotherapy at [facility] with Carfilzomib and Granix   3. ESRD (end stage renal disease) on dialysis ([place]) - continue hemodialysis Q TThS, Sevelamer 800 mg 1 tab TID, Calcium acetate 667 mg give 3 capsules TID   4. Gastroesophageal reflux disease without esophagitis -  stable, continue Ranitidine 300 mg 1 tab Q HS   5. Anxiety and depression - mood is stable, continue Lorazepam 0.5 mg 1 tab Q HS, Sertraline 50 mg daily, Lamotrigine 25 mg 1 tab daily    Family/ staff Communication: Discussed plan of care with resident.  Labs/tests ordered:  None  Goals of care:   Long-term care.   Durenda Age, NP [facility] [phone] (Monday-Friday 8:00 a.m. - 5:00 p.m.) [phone] (after hours)

## 2018-05-01 ENCOUNTER — Other Ambulatory Visit: Payer: Self-pay | Admitting: Internal Medicine

## 2018-05-01 ENCOUNTER — Non-Acute Institutional Stay (SKILLED_NURSING_FACILITY): Payer: Medicare Other | Admitting: Internal Medicine

## 2018-05-01 ENCOUNTER — Encounter: Payer: Self-pay | Admitting: Internal Medicine

## 2018-05-01 DIAGNOSIS — D7589 Other specified diseases of blood and blood-forming organs: Secondary | ICD-10-CM

## 2018-05-01 DIAGNOSIS — C9000 Multiple myeloma not having achieved remission: Secondary | ICD-10-CM | POA: Diagnosis not present

## 2018-05-01 DIAGNOSIS — N186 End stage renal disease: Secondary | ICD-10-CM

## 2018-05-01 DIAGNOSIS — G9340 Encephalopathy, unspecified: Secondary | ICD-10-CM

## 2018-05-01 DIAGNOSIS — Z992 Dependence on renal dialysis: Secondary | ICD-10-CM

## 2018-05-01 LAB — BASIC METABOLIC PANEL
BUN: 37 — AB (ref 4–21)
CREATININE: 7.4 — AB (ref 0.5–1.1)
GLUCOSE: 85
POTASSIUM: 3.9 (ref 3.4–5.3)
SODIUM: 135 — AB (ref 137–147)

## 2018-05-01 LAB — VITAMIN B12: Vitamin B-12: 778

## 2018-05-01 LAB — CBC AND DIFFERENTIAL
HCT: 30 — AB (ref 36–46)
HEMOGLOBIN: 9.3 — AB (ref 12.0–16.0)
Platelets: 109 — AB (ref 150–399)
WBC: 3.3

## 2018-05-01 NOTE — Assessment & Plan Note (Signed)
Check B12 and folate levels ?

## 2018-05-01 NOTE — Assessment & Plan Note (Addendum)
Med list reviewed for potential causes of altered mental status.Acute mental status changes did occur after Phenergan. This is been marked as an adverse drug reaction If mental status changes fail to clear or progress, she may need to be sent to the hospital and dialyzed. Check B12 level because of macrocytosis

## 2018-05-01 NOTE — Assessment & Plan Note (Signed)
Nephrologist to receive copy of today's note

## 2018-05-01 NOTE — Progress Notes (Signed)
   NURSING HOME LOCATION:  [facility] ROOM NUMBER:  [id number]  CODE STATUS:  Full Code  PCP:  Unice Cobble, MD  This is a nursing facility follow up for specific acute issue of mental status changes  Interim medical record and care since last [facility] visit was updated with review of diagnostic studies and change in clinical status since last visit were documented.  HPI: Staff reports that she had nausea and vomiting this morning and was given promethazine. After that they noted garbled speech and lethargy.  She was hospitalized acutely 7/17-7/19/19 for altered mental status. The presentation was of slurred speech with left deviated gaze and left facial droop. Neurology was consulted,MRI did not show any acute intracranial abnormalities. She was felt to have toxic metabolic encephalopathy secondary to uremia. Nephrology saw the patient in hospital and she underwent dialysis with resolution of the altered mental status completely. TSH is current and therapeutic. She has chronic anemia due to CKD. She has dramatic macrocytosis with an MCV of 111.5. There is no B12 level in Epic. She complained of generalized weakness and refused to get hemodialysis today. She had been seen by her oncologist yesterday for follow-up of history of multiple myeloma. She receives weekly chemotherapy and is on valacyclovir every other day. When asked what she was told she states "they just laughed at me". This was one a few sentences which was intelligible. Staff reports that she frequently has refused to go to dialysis  the day after her oncology visits in [place], According to staff the patient has some bowel incontinence which is a source of embarrassment apparently. She literally demands Imodium A-D, but has refused to let the staff visualize the stool. C. difficile evaluation was attempted but only formed stool was documented.  The patient is on Lamictal as a mood stabilizer.  Review of systems:  Review of systems cannot be completed because of her altered mental status and apparent hearing deficit.  Physical exam:  Pertinent or positive findings: As noted she's very lethargic and clinically appears hard of hearing. Her speech has a stuttering, slurred character. Speech for the most part is unintelligible. She has ptosis on the right greater than the left. Is edentulous but wearing only upper plate. She has coarse bibasilar rales. She has a grade 2 systolic murmur with increased second heart sound. Posterior tibial pulses are decreased. She has 2+ edema. She is diffusely weak but can move all extremities. There is no facial asymmetry  General appearance: Adequately nourished; no acute distress, increased work of breathing is present.   Lymphatic: No lymphadenopathy about the head, neck, axilla. Eyes: No conjunctival inflammation or lid edema is present. There is no scleral icterus. Ears:  External ear exam shows no significant lesions or deformities.   Nose:  External nasal examination shows no deformity or inflammation. Nasal mucosa are pink and moist without lesions, exudates Oral exam:  Lips and gums are healthy appearing.  Neck:  No thyromegaly, masses, tenderness noted.    Heart:  No gallop, click, rub .  Lungs:  without wheezes, rhonchi, rubs. Abdomen: Bowel sounds are normal. Abdomen is soft and nontender with no organomegaly, hernias, masses. GU: Deferred  Extremities:  No cyanosis, clubbing  Neurologic exam : Balance, Rhomberg, finger to nose testing could not be completed due to clinical state Skin: Warm & dry w/o tenting. No significant lesions or rash.  See summary under each active problem in the Problem List with associated updated therapeutic plan

## 2018-05-01 NOTE — Patient Instructions (Addendum)
See assessment and plan under each diagnosis in the problem list and acutely for this visit Total time 42  minutes; greater than 50% of the visit spent counseling patient and coordinating care for problems addressed at this encounter

## 2018-05-01 NOTE — Assessment & Plan Note (Signed)
Oncologist to receive a copy of today's notes for continuity of care

## 2018-05-26 ENCOUNTER — Emergency Department (HOSPITAL_COMMUNITY)
Admission: EM | Admit: 2018-05-26 | Discharge: 2018-05-27 | Disposition: A | Discharge status: Home / Self Care | Payer: Medicare Other | Source: Home / Self Care | Attending: Emergency Medicine | Admitting: Emergency Medicine

## 2018-05-26 ENCOUNTER — Encounter (HOSPITAL_COMMUNITY): Payer: Self-pay

## 2018-05-26 DIAGNOSIS — Z992 Dependence on renal dialysis: Secondary | ICD-10-CM | POA: Insufficient documentation

## 2018-05-26 DIAGNOSIS — I120 Hypertensive chronic kidney disease with stage 5 chronic kidney disease or end stage renal disease: Secondary | ICD-10-CM | POA: Insufficient documentation

## 2018-05-26 DIAGNOSIS — N186 End stage renal disease: Secondary | ICD-10-CM

## 2018-05-26 DIAGNOSIS — Z79899 Other long term (current) drug therapy: Secondary | ICD-10-CM

## 2018-05-26 DIAGNOSIS — I10 Essential (primary) hypertension: Secondary | ICD-10-CM

## 2018-05-26 MED ORDER — CLONIDINE HCL 0.1 MG PO TABS
0.1000 mg | ORAL_TABLET | Freq: Once | ORAL | Status: AC
  Administered 2018-05-26: 0.1 mg via ORAL
  Filled 2018-05-26: qty 1

## 2018-05-26 NOTE — ED Notes (Signed)
[facility] called for transport.  

## 2018-05-26 NOTE — ED Notes (Signed)
Pt given turkey sandwich and decaf coffee

## 2018-05-26 NOTE — ED Notes (Signed)
[facility] called to fix EKG

## 2018-05-26 NOTE — ED Provider Notes (Signed)
[facility] EMERGENCY DEPARTMENT Provider Note   CSN: [id number] Arrival date & time: 05/26/18  1658     History   Chief Complaint Chief Complaint  Patient presents with  . Headache    HPI [name] is a 77 y.o. female.  77yo F w/ PMH including HTN, multiple myeloma, ESRD on HD, anxiety/depression who presents with high blood pressure.  The patient was at [facility] today where she began feeling a headache in the back of her head.  He states that she usually gets this when her blood pressure gets elevated.  Her BP was high so she was sent to the ED for evaluation.  She states that she had her morning medications but did not take her lunchtime medications.  Currently, she denies any headache.  She denies vision changes, extremity weakness/numbness, chest pain, or breathing problems.  No complaints currently.  The history is provided by the patient.  Headache      Past Medical History:  Diagnosis Date  . Anxiety   . Closed fracture of right distal femur ([place]) 09/01/2015  . Depression   . ESRD (end stage renal disease) on dialysis [facility])    "TTS; [facility]" (04/24/2017)  . GERD (gastroesophageal reflux disease) 04/23/2015  . HCAP (healthcare-associated pneumonia) 04/24/2017  . Heart murmur   . History of blood transfusion    "low HgB when I was going to dialysis center"  . Hypertension   . Hypertension associatd with end stage renal disease on dialysis 03/11/2014  . Malnutrition of moderate degree 09/02/2015  . Multiple myeloma ([place]) 03/11/2014  . Patient is Jehovah's Witness    "I'd rather never have any blood transfusions unless absolutely necessary; please check with me 1st". (04/24/2017)  . Pneumonia ~ 2016    Patient Active Problem List   Diagnosis Date Noted  . Macrocytosis 05/01/2018  . Hypertension, uncontrolled 04/16/2018  . ESRD (end stage renal disease) on dialysis ([place]) 04/16/2018  . Encephalopathy acute 04/16/2018  . Anxiety and depression  04/16/2018  . Hypertension, accelerated 04/16/2018  . Pressure ulcer, stage I 04/16/2018  . Abnormal chest x-ray 12/26/2017  . HCAP (healthcare-associated pneumonia) 04/24/2017  . Sepsis ([place]) 04/24/2017  . Nausea and vomiting 04/24/2017  . Thrombocytopenia ([place]) 04/24/2017  . Hyperkalemia 01/16/2017  . IBS (irritable bowel syndrome) 12/27/2016  . Mood disorder ([place]) 07/19/2016  . Diastolic dysfunction 19/16/6060  . Insomnia 04/27/2016  . Hyponatremia 03/12/2016  . Symptomatic anemia 03/12/2016  . Diarrhea 03/12/2016  . Essential hypertension   . Swelling of joint of left knee 02/18/2016  . Leukopenia due to antineoplastic chemotherapy ([place]) 09/03/2015  . Malnutrition of moderate degree 09/02/2015  . GERD (gastroesophageal reflux disease) 04/23/2015  . 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 Multiple myeloma ([place]) 03/11/2014  . Hypertension associatd with end stage renal disease on dialysis 03/11/2014    Past Surgical History:  Procedure Laterality Date  . AV FISTULA PLACEMENT Left   . I&D EXTREMITY Left 02/27/2016   Procedure: ARTHROSCOPIC IRRIGATION AND DEBRIDEMENT EXTREMITY;  Surgeon: Renette Butters, MD;  Location: [facility];  Service: Orthopedics;  Laterality: Left;  . TEE WITHOUT CARDIOVERSION N/A 02/29/2016   Procedure: TRANSESOPHAGEAL ECHOCARDIOGRAM (TEE);  Surgeon: Thayer Headings, MD;  Location: [facility];  Service: Cardiovascular;  Laterality: N/A;     OB History   None      Home Medications    Prior to Admission medications   Medication Sig Start Date End Date Taking? Authorizing Provider  amLODipine (  NORVASC) 10 MG tablet Take 10 mg by mouth daily. Hold for SBP <100MM/HG    [provider]  bisacodyl (BISAC-EVAC) 10 MG suppository Place 10 mg rectally once as needed for moderate constipation (FOR CONSTIPATION NOT RELIEVED BY MILK OF MAGNESIA).     [provider]  calcium acetate (PHOSLO) 667 MG capsule Take 2,001 mg by mouth 3 (three)  times daily.     [provider]  cloNIDine (CATAPRES) 0.1 MG tablet Take 0.1 mg by mouth every 8 (eight) hours as needed (for a systolic B/P of 283 or greater).     [provider]  hydrocortisone cream 1 % Apply 1 application topically every 8 (eight) hours as needed for itching. Apply to rectum for hemorrhoids    [provider]  isosorbide mononitrate (IMDUR) 60 MG 24 hr tablet Take 60 mg by mouth daily.  03/24/15   [provider]  lamoTRIgine (LAMICTAL) 25 MG tablet Take 50 mg by mouth daily.     [provider]  latanoprost (XALATAN) 0.005 % ophthalmic solution Place 1 drop into both eyes at bedtime.     [provider]  loperamide (IMODIUM A-D) 2 MG tablet Take 4 mg by mouth as needed for diarrhea or loose stools. The patient requests this for dialysis days.    [provider]  LORazepam (ATIVAN) 0.5 MG tablet Take 1 tablet (0.5 mg total) by mouth at bedtime. 03/21/18   Medina-Vargas, Monina C, NP  losartan (COZAAR) 50 MG tablet Take 50 mg by mouth at bedtime.    [provider]  multivitamin (RENA-VIT) TABS tablet Take 1 tablet by mouth daily.    [provider]  ondansetron (ZOFRAN) 4 MG tablet Take 4 mg by mouth every 8 (eight) hours as needed for nausea or vomiting.    [provider]  ranitidine (ZANTAC) 300 MG tablet Take 300 mg by mouth at bedtime. Reported on 03/05/2016    [provider]  senna-docusate (SENEXON-S) 8.6-50 MG tablet Take 2 tablets by mouth at bedtime as needed.     [provider]  sertraline (ZOLOFT) 50 MG tablet Take 50 mg by mouth daily.     [provider]  sevelamer carbonate (RENVELA) 800 MG tablet Take 800 mg by mouth 3 (three) times daily with meals.     [provider]  simethicone (MYLICON) 151 MG chewable tablet Chew 125 mg by mouth 3 (three) times daily. Take 1 tablet by mouth prior to each meal at 8AM, 11AM, and 6PM    [provider]  valACYclovir (VALTREX) 500 MG tablet Take 1 tablet (500 mg total) by mouth every other day. 03/01/16   Hongalgi, Lenis Dickinson, MD    Family History Family History  Problem Relation Age of Onset  . Hypertension Mother   . Hypertension Father     Social History Social History   Tobacco Use  . Smoking status: Never Smoker  . Smokeless tobacco: Never Used  Substance Use Topics  . Alcohol use: No    Alcohol/week: 0.0 standard drinks  . Drug use: No     Allergies   Phenergan [promethazine hcl]   Review of Systems Review of Systems  Neurological: Positive for headaches.   All other systems reviewed and are negative except that which was mentioned in HPI   Physical Exam Updated Vital Signs BP (!) 118/91   Pulse (!) 55   Temp 98.5 F (36.9 C) (Oral)   Resp 16  Ht '5\' 7"'  (1.702 m)   Wt 65.3 kg   SpO2 94%   BMI 22.55 kg/m   Physical Exam  Constitutional: She is oriented to person, place, and time. She appears well-developed and well-nourished. No distress.  HENT:  Head: Normocephalic and atraumatic.  Moist mucous membranes  Eyes: Pupils are equal, round, and reactive to light. Conjunctivae and EOM are normal.  Neck: Neck supple.  Cardiovascular: Normal rate, regular rhythm and normal heart sounds.  Pulmonary/Chest: Effort normal and breath sounds normal.  Abdominal: Soft. Bowel sounds are normal. She exhibits no distension. There is no tenderness.  Musculoskeletal: She exhibits no edema.  Neurological: She is alert and oriented to person, place, and time. She has normal strength. She displays normal reflexes. Coordination normal.  Fluent speech  Skin: Skin is warm and dry.  Psychiatric: She has a normal mood and affect. Judgment normal.  Nursing note and vitals reviewed.    ED Treatments / Results  Labs (all labs ordered are listed, but only abnormal results are displayed) Labs Reviewed - No data to display  EKG EKG  Interpretation  Date/Time:  Monday May 26 2018 17:41:03 EDT Ventricular Rate:  53 PR Interval:    QRS Duration: 95 QT Interval:  536 QTC Calculation: 504 R Axis:   -25 Text Interpretation:  Sinus rhythm Prolonged PR interval Borderline left axis deviation Abnormal T, probable ischemia, anterior leads Prolonged QT interval Baseline wander in lead(s) III aVL aVF T wave inversions V2-V3 new from previous Confirmed by Theotis Burrow [id number]) on 05/26/2018 5:47:28 PM   Radiology No results found.  Procedures Procedures (including critical care time)  Medications Ordered in ED Medications  cloNIDine (CATAPRES) tablet 0.1 mg (0.1 mg Oral Given 05/26/18 1736)     Initial Impression / Assessment and Plan / ED Course  I have reviewed the triage vital signs and the nursing notes.      She was well-appearing on exam.  Gave her missed dose of clonidine and repeat BP was 118/91.  She denied any headache while in the ED.  She also denies any chest pain or breathing problems to suggest hypertensive emergency.  Emphasized importance of medication compliance and patient discharged in satisfactory condition. Final Clinical Impressions(s) / ED Diagnoses   Final diagnoses:  Essential hypertension    ED Discharge Orders    None       Orel Hord, Wenda Overland, MD 05/26/18 [phone]

## 2018-05-26 NOTE — Discharge Instructions (Addendum)
Make sure to take all medications as prescribed and try not to miss doses, especially with the blood pressure medications.

## 2018-05-26 NOTE — ED Triage Notes (Signed)
Pt arrived via [facility] from [employer] c/o posterior headache  Hx kidney failure and HTN

## 2018-05-27 ENCOUNTER — Emergency Department (HOSPITAL_COMMUNITY): Payer: Medicare Other

## 2018-05-27 ENCOUNTER — Other Ambulatory Visit: Payer: Self-pay

## 2018-05-27 ENCOUNTER — Encounter: Payer: Self-pay | Admitting: Internal Medicine

## 2018-05-27 ENCOUNTER — Encounter (HOSPITAL_COMMUNITY): Payer: Self-pay

## 2018-05-27 ENCOUNTER — Inpatient Hospital Stay (HOSPITAL_COMMUNITY)
Admission: EM | Admit: 2018-05-27 | Discharge: 2018-05-30 | DRG: 682 | Disposition: A | Discharge status: Skilled Nursing Facility | Payer: Medicare Other | Attending: Family Medicine | Admitting: Family Medicine

## 2018-05-27 ENCOUNTER — Non-Acute Institutional Stay (SKILLED_NURSING_FACILITY): Payer: Medicare Other | Admitting: Internal Medicine

## 2018-05-27 DIAGNOSIS — I517 Cardiomegaly: Secondary | ICD-10-CM | POA: Diagnosis present

## 2018-05-27 DIAGNOSIS — G9341 Metabolic encephalopathy: Secondary | ICD-10-CM | POA: Diagnosis present

## 2018-05-27 DIAGNOSIS — I120 Hypertensive chronic kidney disease with stage 5 chronic kidney disease or end stage renal disease: Secondary | ICD-10-CM | POA: Diagnosis not present

## 2018-05-27 DIAGNOSIS — F418 Other specified anxiety disorders: Secondary | ICD-10-CM | POA: Diagnosis present

## 2018-05-27 DIAGNOSIS — Z79899 Other long term (current) drug therapy: Secondary | ICD-10-CM

## 2018-05-27 DIAGNOSIS — N2581 Secondary hyperparathyroidism of renal origin: Secondary | ICD-10-CM | POA: Diagnosis present

## 2018-05-27 DIAGNOSIS — D509 Iron deficiency anemia, unspecified: Secondary | ICD-10-CM | POA: Diagnosis present

## 2018-05-27 DIAGNOSIS — R4182 Altered mental status, unspecified: Secondary | ICD-10-CM | POA: Diagnosis present

## 2018-05-27 DIAGNOSIS — R402360 Coma scale, best motor response, obeys commands, unspecified time: Secondary | ICD-10-CM | POA: Diagnosis present

## 2018-05-27 DIAGNOSIS — E8889 Other specified metabolic disorders: Secondary | ICD-10-CM | POA: Diagnosis present

## 2018-05-27 DIAGNOSIS — R402250 Coma scale, best verbal response, oriented, unspecified time: Secondary | ICD-10-CM | POA: Diagnosis present

## 2018-05-27 DIAGNOSIS — Z9114 Patient's other noncompliance with medication regimen: Secondary | ICD-10-CM

## 2018-05-27 DIAGNOSIS — F329 Major depressive disorder, single episode, unspecified: Secondary | ICD-10-CM | POA: Diagnosis present

## 2018-05-27 DIAGNOSIS — C9000 Multiple myeloma not having achieved remission: Secondary | ICD-10-CM | POA: Diagnosis present

## 2018-05-27 DIAGNOSIS — G9340 Encephalopathy, unspecified: Secondary | ICD-10-CM | POA: Diagnosis not present

## 2018-05-27 DIAGNOSIS — N186 End stage renal disease: Secondary | ICD-10-CM | POA: Diagnosis not present

## 2018-05-27 DIAGNOSIS — K219 Gastro-esophageal reflux disease without esophagitis: Secondary | ICD-10-CM | POA: Diagnosis present

## 2018-05-27 DIAGNOSIS — Z9119 Patient's noncompliance with other medical treatment and regimen: Secondary | ICD-10-CM

## 2018-05-27 DIAGNOSIS — D72825 Bandemia: Secondary | ICD-10-CM | POA: Diagnosis present

## 2018-05-27 DIAGNOSIS — Z9115 Patient's noncompliance with renal dialysis: Secondary | ICD-10-CM

## 2018-05-27 DIAGNOSIS — D696 Thrombocytopenia, unspecified: Secondary | ICD-10-CM | POA: Diagnosis present

## 2018-05-27 DIAGNOSIS — I160 Hypertensive urgency: Secondary | ICD-10-CM | POA: Diagnosis present

## 2018-05-27 DIAGNOSIS — F419 Anxiety disorder, unspecified: Secondary | ICD-10-CM | POA: Diagnosis not present

## 2018-05-27 DIAGNOSIS — Z8673 Personal history of transient ischemic attack (TIA), and cerebral infarction without residual deficits: Secondary | ICD-10-CM

## 2018-05-27 DIAGNOSIS — Z86718 Personal history of other venous thrombosis and embolism: Secondary | ICD-10-CM

## 2018-05-27 DIAGNOSIS — Z888 Allergy status to other drugs, medicaments and biological substances status: Secondary | ICD-10-CM

## 2018-05-27 DIAGNOSIS — D630 Anemia in neoplastic disease: Secondary | ICD-10-CM | POA: Diagnosis present

## 2018-05-27 DIAGNOSIS — I10 Essential (primary) hypertension: Secondary | ICD-10-CM | POA: Diagnosis present

## 2018-05-27 DIAGNOSIS — F0391 Unspecified dementia with behavioral disturbance: Secondary | ICD-10-CM | POA: Diagnosis present

## 2018-05-27 DIAGNOSIS — Z992 Dependence on renal dialysis: Secondary | ICD-10-CM

## 2018-05-27 DIAGNOSIS — R4789 Other speech disturbances: Secondary | ICD-10-CM | POA: Diagnosis present

## 2018-05-27 DIAGNOSIS — R402140 Coma scale, eyes open, spontaneous, unspecified time: Secondary | ICD-10-CM | POA: Diagnosis present

## 2018-05-27 LAB — I-STAT CHEM 8, ED
BUN: 47 mg/dL — AB (ref 8–23)
CALCIUM ION: 1.18 mmol/L (ref 1.15–1.40)
CHLORIDE: 97 mmol/L — AB (ref 98–111)
Creatinine, Ser: 9.3 mg/dL — ABNORMAL HIGH (ref 0.44–1.00)
GLUCOSE: 98 mg/dL (ref 70–99)
HCT: 36 % (ref 36.0–46.0)
Hemoglobin: 12.2 g/dL (ref 12.0–15.0)
Potassium: 4 mmol/L (ref 3.5–5.1)
Sodium: 137 mmol/L (ref 135–145)
TCO2: 30 mmol/L (ref 22–32)

## 2018-05-27 LAB — I-STAT TROPONIN, ED: Troponin i, poc: 0.05 ng/mL (ref 0.00–0.08)

## 2018-05-27 LAB — CBC WITH DIFFERENTIAL/PLATELET
BASOS PCT: 0 %
Basophils Absolute: 0 10*3/uL (ref 0.0–0.1)
EOS ABS: 0.1 10*3/uL (ref 0.0–0.7)
Eosinophils Relative: 2 %
HCT: 35 % — ABNORMAL LOW (ref 36.0–46.0)
HEMOGLOBIN: 10.5 g/dL — AB (ref 12.0–15.0)
LYMPHS PCT: 7 %
Lymphs Abs: 0.4 10*3/uL — ABNORMAL LOW (ref 0.7–4.0)
MCH: 34 pg (ref 26.0–34.0)
MCHC: 30 g/dL (ref 30.0–36.0)
MCV: 113.3 fL — AB (ref 78.0–100.0)
Monocytes Absolute: 0.3 10*3/uL (ref 0.1–1.0)
Monocytes Relative: 6 %
NEUTROS ABS: 4.3 10*3/uL (ref 1.7–7.7)
Neutrophils Relative %: 85 %
Platelets: 147 10*3/uL — ABNORMAL LOW (ref 150–400)
RBC: 3.09 MIL/uL — ABNORMAL LOW (ref 3.87–5.11)
RDW: 15.1 % (ref 11.5–15.5)
WBC MORPHOLOGY: INCREASED
WBC: 5.1 10*3/uL (ref 4.0–10.5)

## 2018-05-27 LAB — COMPREHENSIVE METABOLIC PANEL
ALBUMIN: 3.4 g/dL — AB (ref 3.5–5.0)
ALK PHOS: 53 U/L (ref 38–126)
ALT: 11 U/L (ref 0–44)
ANION GAP: 12 (ref 5–15)
AST: 22 U/L (ref 15–41)
BUN: 44 mg/dL — ABNORMAL HIGH (ref 8–23)
CALCIUM: 10 mg/dL (ref 8.9–10.3)
CO2: 29 mmol/L (ref 22–32)
Chloride: 97 mmol/L — ABNORMAL LOW (ref 98–111)
Creatinine, Ser: 8.68 mg/dL — ABNORMAL HIGH (ref 0.44–1.00)
GFR calc Af Amer: 4 mL/min — ABNORMAL LOW (ref >60)
GFR calc non Af Amer: 4 mL/min — ABNORMAL LOW (ref >60)
GLUCOSE: 101 mg/dL — AB (ref 70–99)
Potassium: 4 mmol/L (ref 3.5–5.1)
Sodium: 138 mmol/L (ref 135–145)
Total Bilirubin: 0.9 mg/dL (ref 0.3–1.2)
Total Protein: 7.5 g/dL (ref 6.5–8.1)

## 2018-05-27 LAB — GLUCOSE, CAPILLARY: GLUCOSE-CAPILLARY: 98 mg/dL (ref 70–99)

## 2018-05-27 LAB — CBG MONITORING, ED: Glucose-Capillary: 103 mg/dL — ABNORMAL HIGH (ref 70–99)

## 2018-05-27 MED ORDER — LATANOPROST 0.005 % OP SOLN
1.0000 [drp] | Freq: Every day | OPHTHALMIC | Status: DC
  Administered 2018-05-28 – 2018-05-29 (×2): 1 [drp] via OPHTHALMIC
  Filled 2018-05-27 (×2): qty 2.5

## 2018-05-27 MED ORDER — RENA-VITE PO TABS
1.0000 | ORAL_TABLET | Freq: Every day | ORAL | Status: DC
  Administered 2018-05-29 – 2018-05-30 (×2): 1 via ORAL
  Filled 2018-05-27 (×2): qty 1

## 2018-05-27 MED ORDER — SODIUM CHLORIDE 0.9% FLUSH
3.0000 mL | Freq: Two times a day (BID) | INTRAVENOUS | Status: DC
  Administered 2018-05-28 – 2018-05-29 (×3): 3 mL via INTRAVENOUS

## 2018-05-27 MED ORDER — HYDRALAZINE HCL 20 MG/ML IJ SOLN
10.0000 mg | INTRAMUSCULAR | Status: DC | PRN
  Administered 2018-05-27 – 2018-05-29 (×4): 10 mg via INTRAVENOUS
  Filled 2018-05-27 (×4): qty 1

## 2018-05-27 MED ORDER — ONDANSETRON HCL 4 MG/2ML IJ SOLN: 4.0000 mg | Freq: Four times a day (QID) | INTRAMUSCULAR | Status: DC | PRN

## 2018-05-27 MED ORDER — LOSARTAN POTASSIUM 50 MG PO TABS
50.0000 mg | ORAL_TABLET | Freq: Once | ORAL | Status: AC
  Administered 2018-05-27: 50 mg via ORAL
  Filled 2018-05-27 (×2): qty 1

## 2018-05-27 MED ORDER — VALACYCLOVIR HCL 500 MG PO TABS
500.0000 mg | ORAL_TABLET | ORAL | Status: DC
  Administered 2018-05-30: 500 mg via ORAL
  Filled 2018-05-27 (×2): qty 1

## 2018-05-27 MED ORDER — NITROGLYCERIN 2 % TD OINT: 1.0000 [in_us] | TOPICAL_OINTMENT | Freq: Four times a day (QID) | TRANSDERMAL | Status: DC

## 2018-05-27 MED ORDER — LOSARTAN POTASSIUM 50 MG PO TABS
50.0000 mg | ORAL_TABLET | Freq: Every day | ORAL | Status: DC
  Administered 2018-05-29: 50 mg via ORAL
  Filled 2018-05-27: qty 1

## 2018-05-27 MED ORDER — HYDRALAZINE HCL 20 MG/ML IJ SOLN
5.0000 mg | Freq: Once | INTRAMUSCULAR | Status: AC
  Administered 2018-05-28: 5 mg via INTRAVENOUS
  Filled 2018-05-27: qty 1

## 2018-05-27 MED ORDER — LAMOTRIGINE 25 MG PO TABS
50.0000 mg | ORAL_TABLET | Freq: Every day | ORAL | Status: DC
  Administered 2018-05-29 – 2018-05-30 (×2): 50 mg via ORAL
  Filled 2018-05-27 (×2): qty 2

## 2018-05-27 MED ORDER — AMLODIPINE BESYLATE 10 MG PO TABS
10.0000 mg | ORAL_TABLET | Freq: Every day | ORAL | Status: DC
  Administered 2018-05-29 – 2018-05-30 (×2): 10 mg via ORAL
  Filled 2018-05-27 (×2): qty 1

## 2018-05-27 MED ORDER — ONDANSETRON HCL 4 MG PO TABS: 4.0000 mg | ORAL_TABLET | Freq: Four times a day (QID) | ORAL | Status: DC | PRN

## 2018-05-27 MED ORDER — ACETAMINOPHEN 325 MG PO TABS: 650.0000 mg | ORAL_TABLET | Freq: Four times a day (QID) | ORAL | Status: DC | PRN

## 2018-05-27 MED ORDER — SODIUM CHLORIDE 0.9% FLUSH: 3.0000 mL | INTRAVENOUS | Status: DC | PRN

## 2018-05-27 MED ORDER — SERTRALINE HCL 50 MG PO TABS
50.0000 mg | ORAL_TABLET | Freq: Every day | ORAL | Status: DC
  Administered 2018-05-29 – 2018-05-30 (×2): 50 mg via ORAL
  Filled 2018-05-27 (×2): qty 1

## 2018-05-27 MED ORDER — CLONIDINE HCL 0.2 MG/24HR TD PTWK
0.2000 mg | MEDICATED_PATCH | TRANSDERMAL | Status: DC
  Filled 2018-05-27: qty 1

## 2018-05-27 MED ORDER — ACETAMINOPHEN 650 MG RE SUPP: 650.0000 mg | Freq: Four times a day (QID) | RECTAL | Status: DC | PRN

## 2018-05-27 MED ORDER — SODIUM CHLORIDE 0.9 % IV SOLN
250.0000 mL | INTRAVENOUS | Status: DC | PRN
  Administered 2018-05-28: 250 mL via INTRAVENOUS

## 2018-05-27 MED ORDER — ISOSORBIDE MONONITRATE ER 60 MG PO TB24
60.0000 mg | ORAL_TABLET | Freq: Every day | ORAL | Status: DC
  Administered 2018-05-29 – 2018-05-30 (×2): 60 mg via ORAL
  Filled 2018-05-27 (×2): qty 1

## 2018-05-27 MED ORDER — AMLODIPINE BESYLATE 5 MG PO TABS
10.0000 mg | ORAL_TABLET | Freq: Once | ORAL | Status: AC
  Administered 2018-05-27: 10 mg via ORAL
  Filled 2018-05-27: qty 2

## 2018-05-27 MED ORDER — SODIUM CHLORIDE 0.9% FLUSH
3.0000 mL | Freq: Two times a day (BID) | INTRAVENOUS | Status: DC
  Administered 2018-05-28 – 2018-05-30 (×5): 3 mL via INTRAVENOUS

## 2018-05-27 MED ORDER — CLONIDINE HCL 0.1 MG PO TABS
0.1000 mg | ORAL_TABLET | Freq: Once | ORAL | Status: AC
  Administered 2018-05-27: 0.1 mg via ORAL
  Filled 2018-05-27: qty 1

## 2018-05-27 MED ORDER — SEVELAMER CARBONATE 800 MG PO TABS
800.0000 mg | ORAL_TABLET | Freq: Three times a day (TID) | ORAL | Status: DC
  Administered 2018-05-29: 800 mg via ORAL
  Filled 2018-05-27 (×2): qty 1

## 2018-05-27 MED ORDER — LORAZEPAM 0.5 MG PO TABS
0.5000 mg | ORAL_TABLET | Freq: Every day | ORAL | Status: DC
  Administered 2018-05-29: 0.5 mg via ORAL
  Filled 2018-05-27: qty 1

## 2018-05-27 MED ORDER — CALCIUM ACETATE (PHOS BINDER) 667 MG PO CAPS
2001.0000 mg | ORAL_CAPSULE | Freq: Three times a day (TID) | ORAL | Status: DC
  Administered 2018-05-29 – 2018-05-30 (×2): 2001 mg via ORAL
  Filled 2018-05-27 (×2): qty 3

## 2018-05-27 MED ORDER — BISACODYL 10 MG RE SUPP: 10.0000 mg | Freq: Once | RECTAL | Status: DC | PRN

## 2018-05-27 MED ORDER — FAMOTIDINE 20 MG PO TABS: 20.0000 mg | ORAL_TABLET | Freq: Every day | ORAL | Status: DC

## 2018-05-27 MED ORDER — HEPARIN SODIUM (PORCINE) 5000 UNIT/ML IJ SOLN
5000.0000 [IU] | Freq: Three times a day (TID) | INTRAMUSCULAR | Status: DC
  Administered 2018-05-28 – 2018-05-30 (×8): 5000 [IU] via SUBCUTANEOUS
  Filled 2018-05-27 (×8): qty 1

## 2018-05-27 NOTE — ED Provider Notes (Signed)
[facility] EMERGENCY DEPARTMENT Provider Note   CSN: [id number] Arrival date & time: 05/27/18  1638     History   Chief Complaint Chief Complaint  Patient presents with  . Altered Mental Status    HPI [name] is a 77 y.o. female.  HPI   Level 5 caveat due to altered mental status.  [name] is a 77 y.o. female, with a history of ESRD on dialysis, HTN, multiple myeloma, presenting to the ED with altered mental status. Last seen normal at 1300 today, per EMS reporting from [facility] staff.  Per EMS, patient is typically alert and oriented x3, but is noncompliant with her medication and dialysis.  They do not know when her last dialysis occurred.  They state they were told me when patient has altered mental status it is due to her skipping dialysis. Transfer form from the facility indicates patient is typically alert and oriented x2, but does not go into more detail on this.  Indicates she refused to go to dialysis today.    Past Medical History:  Diagnosis Date  . Anxiety   . Closed fracture of right distal femur ([place]) 09/01/2015  . Depression   . ESRD (end stage renal disease) on dialysis [facility])    "TTS; [facility]" (04/24/2017)  . GERD (gastroesophageal reflux disease) 04/23/2015  . HCAP (healthcare-associated pneumonia) 04/24/2017  . Heart murmur   . History of blood transfusion    "low HgB when I was going to dialysis center"  . Hypertension   . Hypertension associatd with end stage renal disease on dialysis 03/11/2014  . Malnutrition of moderate degree 09/02/2015  . Multiple myeloma ([place]) 03/11/2014  . Patient is Jehovah's Witness    "I'd rather never have any blood transfusions unless absolutely necessary; please check with me 1st". (04/24/2017)  . Pneumonia ~ 2016    Patient Active Problem List   Diagnosis Date Noted  . Macrocytosis 05/01/2018  . Hypertension, uncontrolled 04/16/2018  . ESRD needing dialysis ([place]) 04/16/2018  .  Encephalopathy acute 04/16/2018  . Anxiety and depression 04/16/2018  . Hypertension, accelerated 04/16/2018  . Pressure ulcer, stage I 04/16/2018  . Abnormal chest x-ray 12/26/2017  . HCAP (healthcare-associated pneumonia) 04/24/2017  . Sepsis ([place]) 04/24/2017  . Nausea and vomiting 04/24/2017  . Thrombocytopenia ([place]) 04/24/2017  . Hyperkalemia 01/16/2017  . IBS (irritable bowel syndrome) 12/27/2016  . Mood disorder ([place]) 07/19/2016  . Diastolic dysfunction 30/06/2329  . Insomnia 04/27/2016  . Hyponatremia 03/12/2016  . Diarrhea 03/12/2016  . Hypertensive urgency 02/18/2016  . Swelling of joint of left knee 02/18/2016  . Leukopenia due to antineoplastic chemotherapy ([place]) 09/03/2015  . Malnutrition of moderate degree 09/02/2015  . GERD (gastroesophageal reflux disease) 04/23/2015  . 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 Multiple myeloma ([place]) 03/11/2014  . Hypertension associatd with end stage renal disease on dialysis 03/11/2014    Past Surgical History:  Procedure Laterality Date  . AV FISTULA PLACEMENT Left   . I&D EXTREMITY Left 02/27/2016   Procedure: ARTHROSCOPIC IRRIGATION AND DEBRIDEMENT EXTREMITY;  Surgeon: Renette Butters, MD;  Location: [facility];  Service: Orthopedics;  Laterality: Left;  . TEE WITHOUT CARDIOVERSION N/A 02/29/2016   Procedure: TRANSESOPHAGEAL ECHOCARDIOGRAM (TEE);  Surgeon: Thayer Headings, MD;  Location: [facility];  Service: Cardiovascular;  Laterality: N/A;     OB History   None      Home Medications    Prior to Admission medications   Medication Sig Start Date End  Date Taking? Authorizing Provider  amLODipine (NORVASC) 10 MG tablet Take 10 mg by mouth daily. Hold for SBP <100MM/HG    [provider]  bisacodyl (BISAC-EVAC) 10 MG suppository Place 10 mg rectally once as needed for moderate constipation (FOR CONSTIPATION NOT RELIEVED BY MILK OF MAGNESIA).     [provider]  calcium acetate (PHOSLO) 667 MG capsule  Take 2,001 mg by mouth 3 (three) times daily.     [provider]  cloNIDine (CATAPRES - DOSED IN MG/24 HR) 0.2 mg/24hr patch Place 0.2 mg onto the skin once a week.     [provider]  cloNIDine (CATAPRES) 0.1 MG tablet Take 0.1 mg by mouth every 8 (eight) hours as needed (for a systolic B/P of 885 or greater).     [provider]  hydrocortisone cream 1 % Apply 1 application topically every 8 (eight) hours as needed for itching. Apply to rectum for hemorrhoids    [provider]  isosorbide mononitrate (IMDUR) 60 MG 24 hr tablet Take 60 mg by mouth daily.  03/24/15   [provider]  lamoTRIgine (LAMICTAL) 25 MG tablet Take 50 mg by mouth daily. Take 2 tablets to = 50 mg    [provider]  latanoprost (XALATAN) 0.005 % ophthalmic solution Place 1 drop into both eyes at bedtime.     [provider]  loperamide (IMODIUM A-D) 2 MG tablet Take 4 mg by mouth as needed for diarrhea or loose stools. The patient requests this for dialysis days.    [provider]  LORazepam (ATIVAN) 0.5 MG tablet Take 1 tablet (0.5 mg total) by mouth at bedtime. 03/21/18   Medina-Vargas, Monina C, NP  losartan (COZAAR) 50 MG tablet Take 50 mg by mouth at bedtime.    [provider]  multivitamin (RENA-VIT) TABS tablet Take 1 tablet by mouth daily.    [provider]  ondansetron (ZOFRAN) 4 MG tablet Take 4 mg by mouth every 8 (eight) hours as needed for nausea or vomiting.    [provider]  ranitidine (ZANTAC) 300 MG tablet Take 300 mg by mouth at bedtime. Reported on 03/05/2016    [provider]  saccharomyces boulardii (FLORASTOR) 250 MG capsule Take 250 mg by mouth 2 (two) times daily as needed.    [provider]  senna-docusate (SENEXON-S) 8.6-50 MG tablet Take 2 tablets by mouth at bedtime as needed.     [provider]  sertraline (ZOLOFT) 50 MG tablet Take 50 mg by mouth daily.     [provider]  sevelamer carbonate (RENVELA) 800 MG tablet Take 800 mg by mouth 3 (three) times daily with meals.     [provider]  simethicone (MYLICON) 027 MG chewable tablet Chew 125 mg by mouth 3 (three) times daily. Take 1 tablet by mouth prior to each meal at 8AM, 11AM, and 6PM    [provider]  valACYclovir (VALTREX) 500 MG tablet Take 1 tablet (500 mg total) by mouth every other day. 03/01/16   Hongalgi, Lenis Dickinson, MD    Family History Family History  Problem Relation Age of Onset  . Hypertension Mother   . Hypertension Father     Social History Social History   Tobacco Use  . Smoking status: Never Smoker  . Smokeless tobacco: Never Used  Substance Use Topics  . Alcohol use: No    Alcohol/week: 0.0 standard drinks  . Drug use: No     Allergies  Phenergan [promethazine hcl]   Review of Systems Review of Systems  Unable to perform ROS: Mental status change     Physical Exam Updated Vital Signs BP (!) 209/107 (BP Location: Right Arm)   Pulse 72   Resp 18   Ht '5\' 7"'  (1.702 m)   Wt 62.9 kg   SpO2 100%   BMI 21.72 kg/m   Physical Exam  Constitutional: She appears well-developed and well-nourished. No distress.  HENT:  Head: Normocephalic and atraumatic.  Eyes: Conjunctivae are normal.  Neck: Neck supple.  Cardiovascular: Normal rate, regular rhythm, normal heart sounds and intact distal pulses.  Pulmonary/Chest: Effort normal and breath sounds normal. No respiratory distress.  Abdominal: Soft. There is no tenderness. There is no guarding.  Musculoskeletal: She exhibits no edema.  Lymphadenopathy:    She has no cervical adenopathy.  Neurological: She is alert.  Patient will intermittently correctly state her name.  She is not otherwise able to answer orientation questions that she simply will not give an answer. She has noted to wave each of her arms around pointing at things around the room. Motor function noted in each of the  extremities.  Skin: Skin is warm and dry. She is not diaphoretic.  Psychiatric: She has a normal mood and affect. Her behavior is normal.  Nursing note and vitals reviewed.    ED Treatments / Results  Labs (all labs ordered are listed, but only abnormal results are displayed) Labs Reviewed  COMPREHENSIVE METABOLIC PANEL - Abnormal; Notable for the following components:      Result Value   Chloride 97 (*)    Glucose, Bld 101 (*)    BUN 44 (*)    Creatinine, Ser 8.68 (*)    Albumin 3.4 (*)    GFR calc non Af Amer 4 (*)    GFR calc Af Amer 4 (*)    All other components within normal limits  CBC WITH DIFFERENTIAL/PLATELET - Abnormal; Notable for the following components:   RBC 3.09 (*)    Hemoglobin 10.5 (*)    HCT 35.0 (*)    MCV 113.3 (*)    Platelets 147 (*)    Lymphs Abs 0.4 (*)    All other components within normal limits  I-STAT CHEM 8, ED - Abnormal; Notable for the following components:   Chloride 97 (*)    BUN 47 (*)    Creatinine, Ser 9.30 (*)    All other components within normal limits  CBG MONITORING, ED - Abnormal; Notable for the following components:   Glucose-Capillary 103 (*)    All other components within normal limits  MRSA PCR SCREENING  BASIC METABOLIC PANEL  VITAMIN I62  FOLATE RBC  AMMONIA  I-STAT CHEM 8, ED  I-STAT TROPONIN, ED    EKG EKG Interpretation  Date/Time:  Tuesday May 27 2018 17:07:03 EDT Ventricular Rate:  56 PR Interval:    QRS Duration: 150 QT Interval:  513 QTC Calculation: 496 R Axis:   -32 Text Interpretation:  Sinus or ectopic atrial rhythm Left ventricular hypertrophy Abnormal T, consider ischemia, anterior leads No significant change since last tracing Confirmed by Wandra Arthurs [phone]) on 05/27/2018 5:37:28 PM   Radiology Dg Chest 2 View  Result Date: 05/27/2018 CLINICAL DATA:  Altered mental status EXAM: CHEST - 2 VIEW COMPARISON:  04/16/2018 FINDINGS: The lungs are well inflated. The cardiomediastinal silhouette  is markedly enlarged. There is no focal airspace consolidation or pulmonary edema. There is no pleural effusion or pneumothorax.  IMPRESSION: Unchanged cardiomegaly without focal airspace disease. Electronically Signed   By: Ulyses Jarred M.D.   On: 05/27/2018 17:59   Ct Head Wo Contrast  Result Date: 05/27/2018 CLINICAL DATA:  Occipital headache since yesterday, altered mental status today. EXAM: CT HEAD WITHOUT CONTRAST TECHNIQUE: Contiguous axial images were obtained from the base of the skull through the vertex without intravenous contrast. COMPARISON:  MRI of the head April 16, 2018 FINDINGS: BRAIN: No intraparenchymal hemorrhage, mass effect nor midline shift. Moderate parenchymal brain volume loss. No hydrocephalus. No hydrocephalus. Patchy supratentorial white matter hypodensities within normal range for patient's age, though non-specific are most compatible with chronic small vessel ischemic disease. Old LEFT cerebellar infarct. Old LEFT thalamus and LEFT basal ganglia lacunar infarcts. No acute large vascular territory infarcts. No abnormal extra-axial fluid collections. Basal cisterns are patent. VASCULAR: Moderate calcific atherosclerosis of the carotid siphons, mild calcific atherosclerosis LEFT MCA. Dolichoectatic intracranial vessels associated with chronic hypertension. SKULL: No skull fracture. Mild lead thickened and dense calvarium suggesting renal osteodystrophy osteopenia. No significant scalp soft tissue swelling. SINUSES/ORBITS: Minimal paranasal sinus mucosal thickening. Mastoid air cells are well aerated. Soft tissue within the external auditory canals most compatible with cerumen. The included ocular globes and orbital contents are non-suspicious. OTHER: None. IMPRESSION: 1. No acute intracranial process. 2. Stable examination including mild-to-moderate chronic small vessel ischemic changes and old lacunar infarcts. Electronically Signed   By: Elon Alas M.D.   On: 05/27/2018 17:43     Procedures Procedures (including critical care time)  Medications Ordered in ED Medications  valACYclovir (VALTREX) tablet 500 mg (has no administration in time range)  amLODipine (NORVASC) tablet 10 mg (has no administration in time range)  cloNIDine (CATAPRES - Dosed in mg/24 hr) patch 0.2 mg (has no administration in time range)  isosorbide mononitrate (IMDUR) 24 hr tablet 60 mg (has no administration in time range)  losartan (COZAAR) tablet 50 mg (has no administration in time range)  LORazepam (ATIVAN) tablet 0.5 mg (has no administration in time range)  sertraline (ZOLOFT) tablet 50 mg (has no administration in time range)  bisacodyl (DULCOLAX) suppository 10 mg (has no administration in time range)  calcium acetate (PHOSLO) capsule 2,001 mg (has no administration in time range)  famotidine (PEPCID) tablet 40 mg (has no administration in time range)  sevelamer carbonate (RENVELA) tablet 800 mg (has no administration in time range)  lamoTRIgine (LAMICTAL) tablet 50 mg (has no administration in time range)  multivitamin (RENA-VIT) tablet 1 tablet (has no administration in time range)  latanoprost (XALATAN) 0.005 % ophthalmic solution 1 drop (has no administration in time range)  heparin injection 5,000 Units (has no administration in time range)  sodium chloride flush (NS) 0.9 % injection 3 mL (has no administration in time range)  sodium chloride flush (NS) 0.9 % injection 3 mL (has no administration in time range)  sodium chloride flush (NS) 0.9 % injection 3 mL (has no administration in time range)  0.9 %  sodium chloride infusion (has no administration in time range)  acetaminophen (TYLENOL) tablet 650 mg (has no administration in time range)    Or  acetaminophen (TYLENOL) suppository 650 mg (has no administration in time range)  ondansetron (ZOFRAN) tablet 4 mg (has no administration in time range)    Or  ondansetron (ZOFRAN) injection 4 mg (has no administration in time  range)  hydrALAZINE (APRESOLINE) injection 10 mg (10 mg Intravenous Given 05/27/18 2124)  cloNIDine (CATAPRES) tablet 0.1 mg (0.1 mg Oral Given 05/27/18  1816)  amLODipine (NORVASC) tablet 10 mg (10 mg Oral Given 05/27/18 1846)  losartan (COZAAR) tablet 50 mg (50 mg Oral Given 05/27/18 1847)     Initial Impression / Assessment and Plan / ED Course  I have reviewed the triage vital signs and the nursing notes.  Pertinent labs & imaging results that were available during my care of the patient were reviewed by me and considered in my medical decision making (see chart for details).  Clinical Course as of May 28 2339  Tue May 27, 2018  1656 Attempted to call [facility] to obtain more information.  No answer.   [SJ]  1701 Attempted to call [facility] again.   [SJ]  3785 Spoke with Verlin Fester, nurse at [facility].  States patient is typically able to carry on a fluid conversation and is intermittently confused, but typically knows what is going on around her.  Around 2 PM, the [employer] went to check on the patient and noted the patient was behaving abnormally.  She was confused, not following simple commands.  She had torn her brief and thrown her food on the floor.  Last documented normal was 11 AM this morning. States patient will typically become confused and have this abnormal behavior when she is overdue for dialysis.  Last documented dialysis was August 22 (Tues, Thurs, Sat schedule). Hypertension noted earlier today and clonidine administered "sometime before 2 PM." No recent falls, trauma, fever/illness.    [SJ]  1832 Improved from previous values.  Hemoglobin(!): 10.5 [SJ]  1839 Spoke with Dr. Justin Mend, nephrologist.  States patient can be admitted via hospitalist and will receive dialysis in the morning.    [SJ]  63 Spoke with Dr. Myna Hidalgo, hospitalist. Agrees to admit the patient.   [SJ]    Clinical Course User Index [SJ] [employer] C, PA-C    Patient presents with  altered mental status.  History seems to show patient comes encephalopathic when she has decided to skip dialysis and returns to normal following dialysis.  No focal deficits.  Patient admitted for further management and will receive dialysis in the morning.  Findings and plan of care discussed with Shirlyn Goltz, MD. Dr. Darl Householder personally evaluated and examined this patient.  Patient was seen yesterday in the ED for hypertension with headache.  Blood pressure was successfully reduced with clonidine and patient was discharged.  Physician note at that time indicates patient was alert and oriented x3.  Vitals:   05/27/18 1855 05/27/18 2000 05/27/18 2107 05/27/18 2109  BP:  (!) 183/105    Pulse:  (!) 49    Resp:  20    Temp: (!) 97.3 F (36.3 C)  97.8 F (36.6 C) 98.4 F (36.9 C)  TempSrc:   Oral   SpO2:  99%    Weight:   64.2 kg   Height:         Final Clinical Impressions(s) / ED Diagnoses   Final diagnoses:  Altered mental status, unspecified altered mental status type  ESRD needing dialysis [facility])    ED Discharge Orders    None       Layla Maw 05/27/18 2352    Drenda Freeze, MD 05/31/18 2207

## 2018-05-27 NOTE — H&P (Signed)
History and Physical    [name] OEV:[id number] DOB: [birth year] DOA: 05/27/2018  PCP: Hendricks Limes, MD   Patient coming from: SNF   Chief Complaint: Uncontrolled hypertension, refused HD, garbled speech   HPI: [name] is a 77 y.o. female with medical history significant for end-stage renal disease on hemodialysis, depression with anxiety, multiple myeloma on chemotherapy, and hypertension, presenting to the emergency department from her SNF with severe hypertension and garbled speech after refusing dialysis and medications today.  She was seen in the emergency department yesterday with severe hypertension after refusing her medications, agreed to take her usual antihypertensives in the ED and her condition improved.  She then went on to refuse dialysis this morning, and refuse her medications again, and again becoming severely hypertensive and has developed garbled speech.  She had a very similar presentation last month and her condition normalized with hemodialysis and blood pressure control.  TSH was normal at that time and she also had MRI brain that was negative for acute findings.  Patient reported a headache earlier, but denies that now.  She denies chest pain.  ED Course: Upon arrival to the ED, patient is found to be afebrile, saturating well on room air, and hypertensive to 204/115.  He EKG features a sinus or ectopic atrial rhythm with LVH.  Noncontrast head CT is negative for acute intracranial abnormality.  Chest x-ray features unchanged cardiomegaly without focal airspace disease.  Chemistry panel is notable for BUN of 44, normal potassium, and normal bicarbonate.  CBC features a stable microcytic anemia with hemoglobin of 10.5 and a chronic thrombocytopenia with platelets 147,000.  Troponin is normal.  Patient was given Norvasc, clonidine, and losartan in the emergency department but blood pressure remains significantly elevated and nephrology was consulted by the ED  physician.  Nephrologist plans for inpatient dialysis and requests a medical admission.  Review of Systems:  All other systems reviewed and apart from HPI, are negative.  Past Medical History:  Diagnosis Date  . Anxiety   . Closed fracture of right distal femur ([place]) 09/01/2015  . Depression   . ESRD (end stage renal disease) on dialysis [facility])    "TTS; [place]" (04/24/2017)  . GERD (gastroesophageal reflux disease) 04/23/2015  . HCAP (healthcare-associated pneumonia) 04/24/2017  . Heart murmur   . History of blood transfusion    "low HgB when I was going to dialysis center"  . Hypertension   . Hypertension associatd with end stage renal disease on dialysis 03/11/2014  . Malnutrition of moderate degree 09/02/2015  . Multiple myeloma ([place]) 03/11/2014  . Patient is Jehov[employer]'s Witness    "I'd rather never have any blood transfusions unless absolutely necessary; please check with me 1st". (04/24/2017)  . Pneumonia ~ 2016    Past Surgical History:  Procedure Laterality Date  . AV FISTULA PLACEMENT Left   . I&D EXTREMITY Left 02/27/2016   Procedure: ARTHROSCOPIC IRRIGATION AND DEBRIDEMENT EXTREMITY;  Surgeon: Renette Butters, MD;  Location: [facility];  Service: Orthopedics;  Laterality: Left;  . TEE WITHOUT CARDIOVERSION N/A 02/29/2016   Procedure: TRANSESOPHAGEAL ECHOCARDIOGRAM (TEE);  Surgeon: Thayer Headings, MD;  Location: [place] [facility];  Service: Cardiovascular;  Laterality: N/A;     reports that she has never smoked. She has never used smokeless tobacco. She reports that she does not drink alcohol or use drugs.  Allergies  Allergen Reactions  . Phenergan [Promethazine Hcl] Other (See Comments)    Acute encephalopathy in the context of refusal to go  to hemodialysis and administration of Phenergan for nausea and vomiting    Family History  Problem Relation Age of Onset  . Hypertension Mother   . Hypertension Father      Prior to Admission medications   Medication Sig Start Date  End Date Taking? Authorizing Provider  amLODipine (NORVASC) 10 MG tablet Take 10 mg by mouth daily. Hold for SBP <100MM/HG    [provider]  bisacodyl (BISAC-EVAC) 10 MG suppository Place 10 mg rectally once as needed for moderate constipation (FOR CONSTIPATION NOT RELIEVED BY MILK OF MAGNESIA).     [provider]  calcium acetate (PHOSLO) 667 MG capsule Take 2,001 mg by mouth 3 (three) times daily.     [provider]  cloNIDine (CATAPRES - DOSED IN MG/24 HR) 0.2 mg/24hr patch Place 0.2 mg onto the skin once a week.     [provider]  cloNIDine (CATAPRES) 0.1 MG tablet Take 0.1 mg by mouth every 8 (eight) hours as needed (for a systolic B/P of 983 or greater).     [provider]  hydrocortisone cream 1 % Apply 1 application topically every 8 (eight) hours as needed for itching. Apply to rectum for hemorrhoids    [provider]  isosorbide mononitrate (IMDUR) 60 MG 24 hr tablet Take 60 mg by mouth daily.  03/24/15   [provider]  lamoTRIgine (LAMICTAL) 25 MG tablet Take 50 mg by mouth daily. Take 2 tablets to = 50 mg    [provider]  latanoprost (XALATAN) 0.005 % ophthalmic solution Place 1 drop into both eyes at bedtime.     [provider]  loperamide (IMODIUM A-D) 2 MG tablet Take 4 mg by mouth as needed for diarrhea or loose stools. The patient requests this for dialysis days.    [provider]  LORazepam (ATIVAN) 0.5 MG tablet Take 1 tablet (0.5 mg total) by mouth at bedtime. 03/21/18   Medina-Vargas, Monina C, NP  losartan (COZAAR) 50 MG tablet Take 50 mg by mouth at bedtime.    [provider]  multivitamin (RENA-VIT) TABS tablet Take 1 tablet by mouth daily.    [provider]  ondansetron (ZOFRAN) 4 MG tablet Take 4 mg by mouth every 8 (eight) hours as needed for nausea or vomiting.    [provider]  ranitidine (ZANTAC) 300 MG tablet Take 300 mg by mouth at  bedtime. Reported on 03/05/2016    [provider]  saccharomyces boulardii (FLORASTOR) 250 MG capsule Take 250 mg by mouth 2 (two) times daily as needed.    [provider]  senna-docusate (SENEXON-S) 8.6-50 MG tablet Take 2 tablets by mouth at bedtime as needed.     [provider]  sertraline (ZOLOFT) 50 MG tablet Take 50 mg by mouth daily.     [provider]  sevelamer carbonate (RENVELA) 800 MG tablet Take 800 mg by mouth 3 (three) times daily with meals.     [provider]  simethicone (MYLICON) 382 MG chewable tablet Chew 125 mg by mouth 3 (three) times daily. Take 1 tablet by mouth prior to each meal at 8AM, 11AM, and 6PM    [provider]  valACYclovir (VALTREX) 500 MG tablet Take 1 tablet (500 mg total) by mouth every other day. 03/01/16   Modena Jansky, MD    Physical Exam: Vitals:   05/27/18 1715 05/27/18 1815 05/27/18 1830 05/27/18 1855  BP: (!) 206/113 (!) 200/116 (!) 204/117  Pulse: (!) 57 (!) 57 (!) 54   Resp: _0 Temp:    (!) 97.3 F (36.3 C)  SpO2: 98% 100% 99%   Weight:      Height:          Constitutional: NAD, calm  Eyes: PERTLA, lids and conjunctivae normal ENMT: Mucous membranes are moist. Posterior pharynx clear of any exudate or lesions.   Neck: normal, supple, no masses, no thyromegaly Respiratory: clear to auscultation bilaterally, no wheezing, no crackles. Normal respiratory effort.    Cardiovascular: Rate ~50 and regular. 1+ pitting edema in distal LE's bilaterally. Abdomen: No distension, no tenderness, soft. Bowel sounds normal.  Musculoskeletal: no clubbing / cyanosis. No joint deformity upper and lower extremities.    Skin: no significant rashes, lesions, ulcers. Warm, dry, well-perfused. Neurologic: No facial asymmetry. PERRL, EOMI. Dysarthria. Sensation intact. Strength 5/5 in all 4 limbs.  Psychiatric: Alert and oriented to person and place, but not oriented to month or year. Calm  and cooperative    Labs on Admission: I have personally reviewed following labs and imaging studies  CBC: Recent Labs  Lab 05/27/18 1715 05/27/18 1723  WBC 5.1  --   NEUTROABS 4.3  --   HGB 10.5* 12.2  HCT 35.0* 36.0  MCV 113.3*  --   PLT 147*  --    Basic Metabolic Panel: Recent Labs  Lab 05/27/18 1715 05/27/18 1723  NA 138 137  K 4.0 4.0  CL 97* 97*  CO2 29  --   GLUCOSE 101* 98  BUN 44* 47*  CREATININE 8.68* 9.30*  CALCIUM 10.0  --    GFR: Estimated Creatinine Clearance: 4.8 mL/min (A) (by C-G formula based on SCr of 9.3 mg/dL (H)). Liver Function Tests: Recent Labs  Lab 05/27/18 1715  AST 22  ALT 11  ALKPHOS 53  BILITOT 0.9  PROT 7.5  ALBUMIN 3.4*   No results for input(s): LIPASE, AMYLASE in the last 168 hours. No results for input(s): AMMONIA in the last 168 hours. Coagulation Profile: No results for input(s): INR, PROTIME in the last 168 hours. Cardiac Enzymes: No results for input(s): CKTOTAL, CKMB, CKMBINDEX, TROPONINI in the last 168 hours. BNP (last 3 results) No results for input(s): PROBNP in the last 8760 hours. HbA1C: No results for input(s): HGBA1C in the last 72 hours. CBG: Recent Labs  Lab 05/27/18 1708  GLUCAP 103*   Lipid Profile: No results for input(s): CHOL, HDL, LDLCALC, TRIG, CHOLHDL, LDLDIRECT in the last 72 hours. Thyroid Function Tests: No results for input(s): TSH, T4TOTAL, FREET4, T3FREE, THYROIDAB in the last 72 hours. Anemia Panel: No results for input(s): VITAMINB12, FOLATE, FERRITIN, TIBC, IRON, RETICCTPCT in the last 72 hours. Urine analysis:    Component Value Date/Time   COLORURINE YELLOW 04/16/2018 [place] 04/16/2018 1305   LABSPEC 1.009 04/16/2018 1305   PHURINE 9.0 (H) 04/16/2018 1305   GLUCOSEU 50 (A) 04/16/2018 1305   HGBUR SMALL (A) 04/16/2018 1305   BILIRUBINUR NEGATIVE 04/16/2018 1305   KETONESUR NEGATIVE 04/16/2018 1305   PROTEINUR 100 (A) 04/16/2018 1305   UROBILINOGEN 0.2  04/10/2015 1800   NITRITE NEGATIVE 04/16/2018 1305   LEUKOCYTESUR MODERATE (A) 04/16/2018 1305   Sepsis Labs: _1 (procalcitonin:4,lacticidven:4) )No results found for this or any previous visit (from the past 240 hour(s)).   Radiological Exams on Admission: Dg Chest 2 View  Result Date: 05/27/2018 CLINICAL DATA:  Altered mental status EXAM: CHEST - 2 VIEW COMPARISON:  04/16/2018 FINDINGS:  The lungs are well inflated. The cardiomediastinal silhouette is markedly enlarged. There is no focal airspace consolidation or pulmonary edema. There is no pleural effusion or pneumothorax. IMPRESSION: Unchanged cardiomegaly without focal airspace disease. Electronically Signed   By: Ulyses Jarred M.D.   On: 05/27/2018 17:59   Ct Head Wo Contrast  Result Date: 05/27/2018 CLINICAL DATA:  Occipital headache since yesterday, altered mental status today. EXAM: CT HEAD WITHOUT CONTRAST TECHNIQUE: Contiguous axial images were obtained from the base of the skull through the vertex without intravenous contrast. COMPARISON:  MRI of the head April 16, 2018 FINDINGS: BRAIN: No intraparenchymal hemorrhage, mass effect nor midline shift. Moderate parenchymal brain volume loss. No hydrocephalus. No hydrocephalus. Patchy supratentorial white matter hypodensities within normal range for patient's age, though non-specific are most compatible with chronic small vessel ischemic disease. Old LEFT cerebellar infarct. Old LEFT thalamus and LEFT basal ganglia lacunar infarcts. No acute large vascular territory infarcts. No abnormal extra-axial fluid collections. Basal cisterns are patent. VASCULAR: Moderate calcific atherosclerosis of the carotid siphons, mild calcific atherosclerosis LEFT MCA. Dolichoectatic intracranial vessels associated with chronic hypertension. SKULL: No skull fracture. Mild lead thickened and dense calvarium suggesting renal osteodystrophy osteopenia. No significant scalp soft tissue swelling. SINUSES/ORBITS:  Minimal paranasal sinus mucosal thickening. Mastoid air cells are well aerated. Soft tissue within the external auditory canals most compatible with cerumen. The included ocular globes and orbital contents are non-suspicious. OTHER: None. IMPRESSION: 1. No acute intracranial process. 2. Stable examination including mild-to-moderate chronic small vessel ischemic changes and old lacunar infarcts. Electronically Signed   By: Elon Alas M.D.   On: 05/27/2018 17:43    EKG: Independently reviewed. Sinus or ectopic atrial rhythm, LVH.   Assessment/Plan   1. ESRD; hypertensive urgency  - Patient refused HD this am, refused medications, and has been markedly hypertensive at her SNF  - No hyperkalemia, uremia, acidosis, or significant volume issues, but she is persistently hypertensive with SBP >200 despite agreeing to take her BP meds in ED  - Nephrology consulting and much appreciated, planning for inpatient HD  - Continue Norvasc, clonidine, and losartan, and use hydralazine IVP's as needed    2. Multiple myeloma  - Follows with heme/onc at [facility] and managed with carfilzomib infusions  - Counts appear stable    3. Depression with anxiety  - Continue Zoloft, Lamictal, prn Ativan   4. Acute encephalopathy  - Presents with garbled speech for the past day  - Exam is non-focal and head CT negative for acute abnormality  - No infectious signs/symptoms  - She had very similar presentation last month, had normal TSH and non-acute MRI brain, and she returned to baseline with HD and BP-control  - Anticipate improvement with HD/BP-control given hx; will check b12 and folate levels given macrotcytosis     DVT prophylaxis: sq heparin  Code Status: Full  Family Communication: Discussed with patient  Consults called: Nephrology Admission status: Observation     Vianne Bulls, MD [facility] Pager [phone]  If 7PM-7AM, please contact night-coverage [email or web address] Password  [facility]  05/27/2018, 7:18 PM

## 2018-05-27 NOTE — Progress Notes (Signed)
Attempted report 

## 2018-05-27 NOTE — Patient Instructions (Signed)
See assessment and plan under each diagnosis in the problem list and acutely for this visit 

## 2018-05-27 NOTE — Progress Notes (Signed)
    NURSING HOME LOCATION:  [facility] ROOM NUMBER:  [id number]  CODE STATUS:  Full Code  PCP:  Hendricks Limes, MD  [place] [id number]  This is a nursing facility follow up for specific acute issue of acute encephalopathy.  Interim medical record and care since last [place] visit was updated with review of diagnostic studies and change in clinical status since last visit were documented.  HPI:The patient was seen in the ED yesterday for headache.The SNF DON reported the patient was joking with the ambulance crew while they were in the process of transferring her to the ED. Headache had been described as occipital in nature prior to the ambulance arrival.Headache was in the context of not taking her lunchtime medications.In the ED she denied any headache, visual change, extremity weakness/numbness or cardiopulmonary symptoms.Blood pressure in the ED was 118/91 after one dose of clonidine 0.1 mg. This morning the patient compared of being tired ; refused to go to dialysis and did not take her morning medications intially.  Staff was able to get her to take a dose of clonidine but blood pressure has remained elevated in the range of 204/102.  The plan was for her to continue the clonidine 0.1 mg every 8 hours but initiate a Catapres-TTS patch 0.2 which would be changed every 7 days. Her daughter was contacted.  The daughter knows her mother has been noncompliant with medications as well as dialysis; but she has demanded the patient be taken to the ED for evaluation "to prevent a stroke".  Review of systems could not be completed due to the acute encephalopathic state.  Physical exam:  Pertinent or positive findings: Speech is garbled and difficult to discern.  She does not follow commands but will move all extremities.  She was able to sit up by herself.  Heart rhythm and rate were irregular with pauses.  Breath sounds were decreased, mild rales were present.   Abdomen was soft.  She has nonpitting edema of the lower extremities.  She has hyperkeratotic changes over the left lower extremity greater than the right.  Pedal pulses are not palpable.  General appearance: Adequately nourished; no increased work of breathing is present.   Lymphatic: No lymphadenopathy about the head, neck, axilla. Eyes: No conjunctival inflammation or lid edema is present. There is no scleral icterus. Ears:  External ear exam shows no significant lesions or deformities.   Nose:  External nasal examination shows no deformity or inflammation. Nasal mucosa are pink and moist without lesions, exudates Abdomen: Bowel sounds are normal. Abdomen is soft and nontender with no organomegaly, hernias, masses. GU: Deferred  Extremities:  No cyanosis  Neurologic exam limited by encephalopathic state Skin: Warm & dry w/o tenting. No significant lesions or rash.  See summary under each active problem in the Problem List with associated updated therapeutic plan

## 2018-05-27 NOTE — Assessment & Plan Note (Signed)
Recurrent pattern appears to be onset of encephalopathy when she refuses to go to dialysis Another component appears to be  Non compliance with her medications intermittently

## 2018-05-27 NOTE — ED Notes (Signed)
Pt unable to sign signature pad. Discharge instructions reviewed with pt and pt's daughter. Pt and daughter verbalized understanding.

## 2018-05-27 NOTE — Assessment & Plan Note (Addendum)
Sent to the ER as her daughter demanded Long-term Catapres-TTS patch will be prescribed with change weekly Because of an anuria with ESRD; urinary catecholamines and metanephrines cannot be collected.  An option would be fractionated plasma metanephrines drawn from an indwelling catheter after 30 minutes of supine rest

## 2018-05-27 NOTE — ED Notes (Signed)
Back from CT and Xray

## 2018-05-27 NOTE — ED Triage Notes (Signed)
Per ems pt is resident of [facility]. Was noted with AMS today. LKW 1300. Pt is non compliant with meds and dialysis. Pt skipped Dialysis and now has AMS.4

## 2018-05-27 NOTE — ED Notes (Signed)
Pt does not swallow well.  This RN gave this pt their medication and had to pour water in her mouth.  She was able to swallow water but it was difficult.  This RN will do a Stroke Swallow screen

## 2018-05-28 ENCOUNTER — Encounter (HOSPITAL_COMMUNITY): Payer: Self-pay | Admitting: General Practice

## 2018-05-28 DIAGNOSIS — F418 Other specified anxiety disorders: Secondary | ICD-10-CM | POA: Diagnosis present

## 2018-05-28 DIAGNOSIS — F0391 Unspecified dementia with behavioral disturbance: Secondary | ICD-10-CM | POA: Diagnosis present

## 2018-05-28 DIAGNOSIS — R402250 Coma scale, best verbal response, oriented, unspecified time: Secondary | ICD-10-CM | POA: Diagnosis present

## 2018-05-28 DIAGNOSIS — G9341 Metabolic encephalopathy: Secondary | ICD-10-CM | POA: Diagnosis present

## 2018-05-28 DIAGNOSIS — Z9114 Patient's other noncompliance with medication regimen: Secondary | ICD-10-CM | POA: Diagnosis not present

## 2018-05-28 DIAGNOSIS — E8889 Other specified metabolic disorders: Secondary | ICD-10-CM | POA: Diagnosis present

## 2018-05-28 DIAGNOSIS — D696 Thrombocytopenia, unspecified: Secondary | ICD-10-CM | POA: Diagnosis present

## 2018-05-28 DIAGNOSIS — R4789 Other speech disturbances: Secondary | ICD-10-CM | POA: Diagnosis present

## 2018-05-28 DIAGNOSIS — K219 Gastro-esophageal reflux disease without esophagitis: Secondary | ICD-10-CM | POA: Diagnosis present

## 2018-05-28 DIAGNOSIS — Z86718 Personal history of other venous thrombosis and embolism: Secondary | ICD-10-CM | POA: Diagnosis not present

## 2018-05-28 DIAGNOSIS — R402360 Coma scale, best motor response, obeys commands, unspecified time: Secondary | ICD-10-CM | POA: Diagnosis present

## 2018-05-28 DIAGNOSIS — I160 Hypertensive urgency: Secondary | ICD-10-CM | POA: Diagnosis present

## 2018-05-28 DIAGNOSIS — D72825 Bandemia: Secondary | ICD-10-CM | POA: Diagnosis present

## 2018-05-28 DIAGNOSIS — R4182 Altered mental status, unspecified: Secondary | ICD-10-CM | POA: Diagnosis present

## 2018-05-28 DIAGNOSIS — D630 Anemia in neoplastic disease: Secondary | ICD-10-CM | POA: Diagnosis present

## 2018-05-28 DIAGNOSIS — N186 End stage renal disease: Secondary | ICD-10-CM | POA: Diagnosis not present

## 2018-05-28 DIAGNOSIS — Z9119 Patient's noncompliance with other medical treatment and regimen: Secondary | ICD-10-CM | POA: Diagnosis not present

## 2018-05-28 DIAGNOSIS — I120 Hypertensive chronic kidney disease with stage 5 chronic kidney disease or end stage renal disease: Secondary | ICD-10-CM | POA: Diagnosis present

## 2018-05-28 DIAGNOSIS — N2581 Secondary hyperparathyroidism of renal origin: Secondary | ICD-10-CM | POA: Diagnosis present

## 2018-05-28 DIAGNOSIS — Z9115 Patient's noncompliance with renal dialysis: Secondary | ICD-10-CM | POA: Diagnosis not present

## 2018-05-28 DIAGNOSIS — I517 Cardiomegaly: Secondary | ICD-10-CM | POA: Diagnosis present

## 2018-05-28 DIAGNOSIS — C9000 Multiple myeloma not having achieved remission: Secondary | ICD-10-CM | POA: Diagnosis present

## 2018-05-28 DIAGNOSIS — D509 Iron deficiency anemia, unspecified: Secondary | ICD-10-CM | POA: Diagnosis present

## 2018-05-28 DIAGNOSIS — R402140 Coma scale, eyes open, spontaneous, unspecified time: Secondary | ICD-10-CM | POA: Diagnosis present

## 2018-05-28 DIAGNOSIS — Z992 Dependence on renal dialysis: Secondary | ICD-10-CM | POA: Diagnosis not present

## 2018-05-28 LAB — BASIC METABOLIC PANEL
ANION GAP: 11 (ref 5–15)
BUN: 46 mg/dL — ABNORMAL HIGH (ref 8–23)
CALCIUM: 10 mg/dL (ref 8.9–10.3)
CHLORIDE: 97 mmol/L — AB (ref 98–111)
CO2: 29 mmol/L (ref 22–32)
Creatinine, Ser: 8.88 mg/dL — ABNORMAL HIGH (ref 0.44–1.00)
GFR calc non Af Amer: 4 mL/min — ABNORMAL LOW (ref >60)
GFR, EST AFRICAN AMERICAN: 4 mL/min — AB (ref >60)
Glucose, Bld: 110 mg/dL — ABNORMAL HIGH (ref 70–99)
POTASSIUM: 3.7 mmol/L (ref 3.5–5.1)
Sodium: 137 mmol/L (ref 135–145)

## 2018-05-28 LAB — PROCALCITONIN: Procalcitonin: 1.78 ng/mL

## 2018-05-28 LAB — GLUCOSE, CAPILLARY: GLUCOSE-CAPILLARY: 81 mg/dL (ref 70–99)

## 2018-05-28 LAB — MRSA PCR SCREENING: MRSA BY PCR: NEGATIVE

## 2018-05-28 LAB — AMMONIA: AMMONIA: 26 umol/L (ref 9–35)

## 2018-05-28 LAB — VITAMIN B12: Vitamin B-12: 782 pg/mL (ref 180–914)

## 2018-05-28 MED ORDER — IPRATROPIUM-ALBUTEROL 0.5-2.5 (3) MG/3ML IN SOLN
3.0000 mL | Freq: Two times a day (BID) | RESPIRATORY_TRACT | Status: DC
  Administered 2018-05-29: 3 mL via RESPIRATORY_TRACT
  Filled 2018-05-28 (×2): qty 3

## 2018-05-28 MED ORDER — FAMOTIDINE IN NACL 20-0.9 MG/50ML-% IV SOLN
20.0000 mg | INTRAVENOUS | Status: DC
  Administered 2018-05-28 – 2018-05-30 (×3): 20 mg via INTRAVENOUS
  Filled 2018-05-28 (×2): qty 50

## 2018-05-28 MED ORDER — SODIUM CHLORIDE 0.9 % IV SOLN
1.0000 g | Freq: Once | INTRAVENOUS | Status: AC
  Administered 2018-05-28: 1 g via INTRAVENOUS
  Filled 2018-05-28: qty 1

## 2018-05-28 MED ORDER — VANCOMYCIN HCL 10 G IV SOLR
1250.0000 mg | Freq: Once | INTRAVENOUS | Status: AC
  Administered 2018-05-28: 1250 mg via INTRAVENOUS
  Filled 2018-05-28: qty 1250

## 2018-05-28 MED ORDER — VANCOMYCIN HCL IN DEXTROSE 750-5 MG/150ML-% IV SOLN
750.0000 mg | INTRAVENOUS | Status: DC
  Administered 2018-05-29: 750 mg via INTRAVENOUS
  Filled 2018-05-28: qty 150

## 2018-05-28 MED ORDER — DEXTROSE 5 % IV SOLN
500.0000 mg | INTRAVENOUS | Status: DC
  Administered 2018-05-29: 500 mg via INTRAVENOUS
  Filled 2018-05-28: qty 0.5

## 2018-05-28 MED ORDER — IPRATROPIUM-ALBUTEROL 0.5-2.5 (3) MG/3ML IN SOLN
3.0000 mL | Freq: Four times a day (QID) | RESPIRATORY_TRACT | Status: DC
  Administered 2018-05-28: 3 mL via RESPIRATORY_TRACT
  Filled 2018-05-28: qty 3

## 2018-05-28 NOTE — Significant Event (Signed)
Rapid Response Event Note  Overview:  Called about pt with AMS and hypertension. Instructed to get a recent set of vitals and CBG.    Initial Focused Assessment: Upon arrival, pt confused in bed. Will respond at times and follow simple commands. Pupils 2 and reactive bilaterally. BP remains elevated despite receiving PRN Hydralazine.   Instructed RN to page Md for BP medication and inform of continued AMS (pt admitted with AMS-not a new problem).   Plan of Care (if not transferred): Continue to monitor neurologic status along with BP control. Plan for pt to receive dialysis tomorrow am. Bedside RN informed that pt mental status will most likely continue until HD performed. Instructed to call with any changes or concerns.   Event Summary:   called at  [id number]    arrived at  [facility]

## 2018-05-28 NOTE — Consult Note (Addendum)
[facility] Renal Consultation Note  Indication for Consultation:  Management of ESRD/hemodialysis; anemia, hypertension/volume and secondary hyperparathyroidism  HPI: [name] is a 77 y.o. female  with ESRD 2/2 multiple myeloma on HD TTS at [facility], first starting in 07/2013.  Past medical history significant for MM dx in 2009, GERD, HTN, Hz depression, Ho  CVA,  Hx pancytopenia, and Jehovah's Witness.  She is admitted with AMS, uncontrolled HTN, Refused HD yesterday, garbled speech.She was sent form her SNF  With similar clinical state 7/17-19.2019 had hd and med's and mentation cleared  To baseline .  Last op hd at [facility] Sat 8/24     Seen in room with sitter  present with lethargy, arousalable , nonverbal , opens eyes to voice not following requests.  Noted CT =No acute intracranial process/ mild /mod CSVD and old lacunar infarcts. K 3.7 BUN 46  Scr 8.88 NA 137 Glu 110 Ammonia 26 . HGB 12.2, wbc 5.1. CXR no pulm  Edema or focal airspace dz.          Past Medical History:  Diagnosis Date  . Anxiety   . Closed fracture of right distal femur ([place]) 09/01/2015  . Depression   . ESRD (end stage renal disease) on dialysis [facility])    "TTS; [facility]" (04/24/2017)  . GERD (gastroesophageal reflux disease) 04/23/2015  . HCAP (healthcare-associated pneumonia) 04/24/2017  . Heart murmur   . History of blood transfusion    "low HgB when I was going to dialysis center"  . Hypertension   . Hypertension associatd with end stage renal disease on dialysis 03/11/2014  . Malnutrition of moderate degree 09/02/2015  . Multiple myeloma ([place]) 03/11/2014  . Patient is Jehovah's Witness    "I'd rather never have any blood transfusions unless absolutely necessary; please check with me 1st". (04/24/2017)  . Pneumonia ~ 2016    Past Surgical History:  Procedure Laterality Date  . AV FISTULA PLACEMENT Left   . I&D EXTREMITY Left 02/27/2016   Procedure: ARTHROSCOPIC IRRIGATION AND  DEBRIDEMENT EXTREMITY;  Surgeon: Renette Butters, MD;  Location: [facility];  Service: Orthopedics;  Laterality: Left;  . TEE WITHOUT CARDIOVERSION N/A 02/29/2016   Procedure: TRANSESOPHAGEAL ECHOCARDIOGRAM (TEE);  Surgeon: Thayer Headings, MD;  Location: [facility] ENDOSCOPY;  Service: Cardiovascular;  Laterality: N/A;      Family History  Problem Relation Age of Onset  . Hypertension Mother   . Hypertension Father       reports that she has never smoked. She has never used smokeless tobacco. She reports that she does not drink alcohol or use drugs.   Allergies  Allergen Reactions  . Phenergan [Promethazine Hcl] Other (See Comments)    Acute encephalopathy in the context of refusal to go to hemodialysis and administration of Phenergan for nausea and vomiting    Prior to Admission medications   Medication Sig Start Date End Date Taking? Authorizing Provider  amLODipine (NORVASC) 10 MG tablet Take 10 mg by mouth daily. Hold for SBP <100MM/HG    [provider]  bisacodyl (BISAC-EVAC) 10 MG suppository Place 10 mg rectally once as needed for moderate constipation (FOR CONSTIPATION NOT RELIEVED BY MILK OF MAGNESIA).     [provider]  calcium acetate (PHOSLO) 667 MG capsule Take 2,001 mg by mouth 3 (three) times daily.     [provider]  cloNIDine (CATAPRES - DOSED IN MG/24 HR) 0.2 mg/24hr patch Place 0.2 mg onto the skin once a  week.     [provider]  cloNIDine (CATAPRES) 0.1 MG tablet Take 0.1 mg by mouth every 8 (eight) hours as needed (for a systolic B/P of 951 or greater).     [provider]  hydrocortisone cream 1 % Apply 1 application topically every 8 (eight) hours as needed for itching. Apply to rectum for hemorrhoids    [provider]  isosorbide mononitrate (IMDUR) 60 MG 24 hr tablet Take 60 mg by mouth daily.  03/24/15   [provider]  lamoTRIgine (LAMICTAL) 25 MG tablet Take 50 mg by mouth daily. Take 2 tablets to = 50  mg    [provider]  latanoprost (XALATAN) 0.005 % ophthalmic solution Place 1 drop into both eyes at bedtime.     [provider]  loperamide (IMODIUM A-D) 2 MG tablet Take 4 mg by mouth as needed for diarrhea or loose stools. The patient requests this for dialysis days.    [provider]  LORazepam (ATIVAN) 0.5 MG tablet Take 1 tablet (0.5 mg total) by mouth at bedtime. 03/21/18   Medina-Vargas, Monina C, NP  losartan (COZAAR) 50 MG tablet Take 50 mg by mouth at bedtime.    [provider]  multivitamin (RENA-VIT) TABS tablet Take 1 tablet by mouth daily.    [provider]  ondansetron (ZOFRAN) 4 MG tablet Take 4 mg by mouth every 8 (eight) hours as needed for nausea or vomiting.    [provider]  ranitidine (ZANTAC) 300 MG tablet Take 300 mg by mouth at bedtime. Reported on 03/05/2016    [provider]  saccharomyces boulardii (FLORASTOR) 250 MG capsule Take 250 mg by mouth 2 (two) times daily as needed.    [provider]  senna-docusate (SENEXON-S) 8.6-50 MG tablet Take 2 tablets by mouth at bedtime as needed.     [provider]  sertraline (ZOLOFT) 50 MG tablet Take 50 mg by mouth daily.     [provider]  sevelamer carbonate (RENVELA) 800 MG tablet Take 800 mg by mouth 3 (three) times daily with meals.     [provider]  simethicone (MYLICON) 884 MG chewable tablet Chew 125 mg by mouth 3 (three) times daily. Take 1 tablet by mouth prior to each meal at 8AM, 11AM, and 6PM    [provider]  valACYclovir (VALTREX) 500 MG tablet Take 1 tablet (500 mg total) by mouth every other day. 03/01/16   Modena Jansky, MD     Anti-infectives (From admission, onward)   Start     Dose/Rate Route Frequency Ordered Stop   05/28/18 1000  valACYclovir (VALTREX) tablet 500 mg     500 mg Oral Every 48 hours 05/27/18 1918        Results for orders placed or performed during the hospital  encounter of 05/27/18 (from the past 48 hour(s))  CBG monitoring, ED     Status: Abnormal   Collection Time: 05/27/18  5:08 PM  Result Value Ref Range   Glucose-Capillary 103 (H) 70 - 99 mg/dL  Comprehensive metabolic panel     Status: Abnormal   Collection Time: 05/27/18  5:15 PM  Result Value Ref Range   Sodium 138 135 - 145 mmol/L   Potassium 4.0 3.5 - 5.1 mmol/L   Chloride 97 (L) 98 - 111 mmol/L   CO2 29 22 - 32 mmol/L   Glucose, Bld 101 (H) 70 - 99 mg/dL   BUN 44 (H) 8 -  23 mg/dL   Creatinine, Ser 8.68 (H) 0.44 - 1.00 mg/dL   Calcium 10.0 8.9 - 10.3 mg/dL   Total Protein 7.5 6.5 - 8.1 g/dL   Albumin 3.4 (L) 3.5 - 5.0 g/dL   AST 22 15 - 41 U/L   ALT 11 0 - 44 U/L   Alkaline Phosphatase 53 38 - 126 U/L   Total Bilirubin 0.9 0.3 - 1.2 mg/dL   GFR calc non Af Amer 4 (L) >60 mL/min   GFR calc Af Amer 4 (L) >60 mL/min    Comment: (NOTE) The eGFR has been calculated using the CKD EPI equation. This calculation has not been validated in all clinical situations. eGFR's persistently <60 mL/min signify possible Chronic Kidney Disease.    Anion gap 12 5 - 15    Comment: Performed at [facility] [address] [place], [place] [address]  CBC with Differential     Status: Abnormal   Collection Time: 05/27/18  5:15 PM  Result Value Ref Range   WBC 5.1 4.0 - 10.5 K/uL   RBC 3.09 (L) 3.87 - 5.11 MIL/uL   Hemoglobin 10.5 (L) 12.0 - 15.0 g/dL   HCT 35.0 (L) 36.0 - 46.0 %   MCV 113.3 (H) 78.0 - 100.0 fL   MCH 34.0 26.0 - 34.0 pg   MCHC 30.0 30.0 - 36.0 g/dL   RDW 15.1 11.5 - 15.5 %   Platelets 147 (L) 150 - 400 K/uL   Neutrophils Relative % 85 %   Lymphocytes Relative 7 %   Monocytes Relative 6 %   Eosinophils Relative 2 %   Basophils Relative 0 %   Neutro Abs 4.3 1.7 - 7.7 K/uL   Lymphs Abs 0.4 (L) 0.7 - 4.0 K/uL   Monocytes Absolute 0.3 0.1 - 1.0 K/uL   Eosinophils Absolute 0.1 0.0 - 0.7 K/uL   Basophils Absolute 0.0 0.0 - 0.1 K/uL   RBC Morphology POLYCHROMASIA  PRESENT     Comment: OVAL MACROCYTES   WBC Morphology INCREASED BANDS (>20% BANDS)     Comment: Performed at [facility], [place] [address], [place], [place] [address]  I-stat troponin, ED     Status: None   Collection Time: 05/27/18  5:21 PM  Result Value Ref Range   Troponin i, poc 0.05 0.00 - 0.08 ng/mL   Comment 3            Comment: Due to the release kinetics of cTnI, a negative result within the first hours of the onset of symptoms does not rule out myocardial infarction with certainty. If myocardial infarction is still suspected, repeat the test at appropriate intervals.   I-stat Chem 8, ED     Status: Abnormal   Collection Time: 05/27/18  5:23 PM  Result Value Ref Range   Sodium 137 135 - 145 mmol/L   Potassium 4.0 3.5 - 5.1 mmol/L   Chloride 97 (L) 98 - 111 mmol/L   BUN 47 (H) 8 - 23 mg/dL   Creatinine, Ser 9.30 (H) 0.44 - 1.00 mg/dL   Glucose, Bld 98 70 - 99 mg/dL   Calcium, Ion 1.18 1.15 - 1.40 mmol/L   TCO2 30 22 - 32 mmol/L   Hemoglobin 12.2 12.0 - 15.0 g/dL   HCT 36.0 36.0 - 46.0 %  MRSA PCR Screening     Status: None   Collection Time: 05/27/18  9:20 PM  Result Value Ref Range   MRSA by PCR NEGATIVE NEGATIVE  Comment:        The GeneXpert MRSA Assay (FDA approved for NASAL specimens only), is one component of a comprehensive MRSA colonization surveillance program. It is not intended to diagnose MRSA infection nor to guide or monitor treatment for MRSA infections. Performed at [facility], [place] [address] [place], [place] [address]   Glucose, capillary     Status: None   Collection Time: 05/27/18 11:43 PM  Result Value Ref Range   Glucose-Capillary 98 70 - 99 mg/dL  Basic metabolic panel     Status: Abnormal   Collection Time: 05/28/18 12:24 AM  Result Value Ref Range   Sodium 137 135 - 145 mmol/L   Potassium 3.7 3.5 - 5.1 mmol/L   Chloride 97 (L) 98 - 111 mmol/L   CO2 29 22 - 32 mmol/L   Glucose, Bld 110 (H) 70 - 99 mg/dL   BUN 46  (H) 8 - 23 mg/dL   Creatinine, Ser 8.88 (H) 0.44 - 1.00 mg/dL   Calcium 10.0 8.9 - 10.3 mg/dL   GFR calc non Af Amer 4 (L) >60 mL/min   GFR calc Af Amer 4 (L) >60 mL/min    Comment: (NOTE) The eGFR has been calculated using the CKD EPI equation. This calculation has not been validated in all clinical situations. eGFR's persistently <60 mL/min signify possible Chronic Kidney Disease.    Anion gap 11 5 - 15    Comment: Performed at [place] [address], [place], [place] [address]  Vitamin B12     Status: None   Collection Time: 05/28/18 12:24 AM  Result Value Ref Range   Vitamin B-12 782 180 - 914 pg/mL    Comment: (NOTE) This assay is not validated for testing neonatal or myeloproliferative syndrome specimens for Vitamin B12 levels. Performed at [facility], [place] [address] [place], [place] [address]   Ammonia     Status: None   Collection Time: 05/28/18 12:24 AM  Result Value Ref Range   Ammonia 26 9 - 35 umol/L    Comment: Performed at [facility], [place] [address], [place], [place] [address]     ROS: not able to obtain sec MS / see hpi   Physical Exam: Vitals:   05/28/18 0330 05/28/18 0516  BP:    Pulse:    Resp:    Temp: 98 F (36.7 C) (!) 97.4 F (36.3 C)  SpO2:       General: Chronically ill thin  Elderly AAF,  lethargy arousal to voice  , nonverbal , opens eyes to voice  HEENT: mild facial edema, [place], nonicteric,mm dry Neck: supple, no JVD Heart: RRR , 2/6 sem , no rub or galloop Lungs:  CTA ,nonlabored breathoing  Abdomen: BS pos , soft NT, ND Extremities: trace bipedal edeama  Skin: no overt rassh or pedal ulcers  Neuro:  Nonverbal. lethargy arousal to voice  , nonverbal , opens eyes to voice not following requests. Moves extrem independently  Dialysis Access: Pos bruit LUA AVF  Dialysis Orders: Center: [facility]  on TTS  .3.5 hrs  EDW 63 kg (did leave below edw to 62  Last tx )  2k, 2.25 ca   Heparin NONE. Access LUA AVF       EPO 19,000 q hd (last op hgb 9.9)   Assessment/Plan  1. ESRD -  HD TTS , Missed hd yesterday , HD today and tomor  2. AMS/ Encephalopathy  - ??  Uremia  Vs.HTN encephal .vs ? - hd , and admit team wu  3. Hypertension/volume  - HD today and meds  4. Anemia  - HGB >12 no esa (was on epo as op ) 5. Metabolic bone disease -  No vit d , binder when po fu ca/phos trend 6. HO depression / Dementia/Old cva- at snh  , on meds  Per admit Zoloft, Lamictal, prn Ativan    Ernest Haber, PA-C [facility] [phone] 05/28/2018, 8:52 AM

## 2018-05-28 NOTE — Progress Notes (Signed)
Pt seem more confused as compare to base line BP running so high even though iv hydralazine give as prescribed looks very uncomfortable and  weak on call physician [facility] was notified about pt change in condition as well as rapid response all came to reviewed pt no new order yet will continue to monitor pt

## 2018-05-28 NOTE — Progress Notes (Signed)
RN paged NP because pt was more confused than when first admitted to floor. Restless and basically non verbal. Not following commands. BP 200s, giving Hydral prn. S: pt will not participate in ROS or exam due to mental status.  O: Chronically ill appearing female in NAD. BP 170s. RR normal and without increased WOB. Lungs clear. SEM 4/6. RRR. SaO2 normal. Restless in bed. Will not answer any questions. Unable to tell NP her name or where she is. Pulling at lines. A/P: 1. Confusion due to [name] has been consulted and plan HD early am. This apparently is the way she acted on last hospitalization and her mental status cleared with HD and lowering of BP. Failed swallow study with confusion. Keep NPO for now.  2. Hypertensive emergency-improving. Hydral prn. **NP touched base with admitting TRH Dr. Myna Hidalgo given pt's change in mental status. Discussed NP findings. No urgent treatment needed now. Continue to lower BP and have HD in am.  3. ESRD with missed HD-HD in am. Rechecked BMP and basically the same. Creat stable, K is normal.  She is in no distress.  KJKG, NP [facility]  Total critical care time: 25 minutes Critical care time was exclusive of separately billable procedures and treating other patients. Critical care was necessary to treat or prevent imminent or life-threatening deterioration. Critical care was time spent personally by me on the following activities: development of treatment plan with patient and/or surrogate as well as nursing, discussions with consultants, evaluation of patient's response to treatment, examination of patient, obtaining history from patient or surrogate, ordering and performing treatments and interventions, ordering and review of laboratory studies, ordering and review of radiographic studies, pulse oximetry and re-evaluation of patient's condition.

## 2018-05-28 NOTE — Progress Notes (Signed)
PROGRESS NOTE    Allysha Tryon  DQQ:[id number] DOB: [birth year] DOA: 05/27/2018 PCP: Hendricks Limes, MD  Outpatient Specialists:   Brief Narrative:  Azalie Harbeck is a 77 y.o. female with past medical history significant for end-stage renal disease on hemodialysis, depression with anxiety, multiple myeloma on chemotherapy, and hypertension.  Patient presented from the skilled nursing facility to the hospital with altered mentation, severe hypertension and garbled speech after refusing dialysis and medications the day of admission.  She was seen in the emergency department a day prior to admission with severe hypertension after refusing her medications, agreed to take her usual antihypertensives in the ED and her condition improved.  She then went on to refuse dialysis on the morning of admission, and refused her medications again, and again becoming severely hypertensive and has developed garbled speech.  She had a very similar presentation last month and her condition normalized with hemodialysis and blood pressure control.    Work-up done revealed bandemia.  We will panculture patient.  We will go ahead and start patient on IV antibiotics.  Will have a low threshold to consult oncology team (patient is on treatment for multiple myeloma).   Assessment & Plan:   Principal Problem:   ESRD needing dialysis ([place]) Active Problems:   Multiple myeloma ([place])   Hypertension associatd with end stage renal disease on dialysis   Anemia in neoplastic disease   Hypertensive urgency   Encephalopathy acute   Anxiety and depression   Hypertension, accelerated   Altered mental status   Acute encephalopathy: - Presents with garbled speech for the past day  - Exam is non-focal and head CT negative for acute abnormality  - No infectious signs/symptoms  - She had very similar presentation last month, had normal TSH and non-acute MRI brain, and she returned to baseline with HD and BP-control  - Anticipate  improvement with HD/BP-control given hx; will check b12 and folate levels given macrotcytosis   05/28/2018: Bandemia is noted.  We will panculture patient.  Will start patient on antibiotics.  Low threshold to consult oncology/hematology team   ESRD; hypertensive urgency  - Patient refused HD this am, refused medications, and has been markedly hypertensive at her SNF  - No hyperkalemia, uremia, acidosis, or significant volume issues, but she is persistently hypertensive with SBP >200 despite agreeing to take her BP meds in ED  - Nephrology consulting and much appreciated, planning for inpatient HD  - Continue Norvasc, clonidine, and losartan, and use hydralazine IVP's as needed 05/28/2018: Cautiously optimize blood pressure.  Hemodialysis will also help with blood pressure control.  Challenging problem has to do with patient's compliance.    Multiple myeloma: - Follows with heme/onc at [facility] and managed with carfilzomib infusions  - Counts appear stable   05/20/2018: CBC reveals bandemia.  Low threshold to consult hematology oncology team.  Depression with anxiety  - Continue Zoloft, Lamictal, prn Ativan   DVT prophylaxis: sq heparin  Code Status: Full  Family Communication:  Patient's brother.    Consults called: Nephrology.  Low threshold to consult hematology/oncology team.  Antimicrobials:   We will start patient on IV vancomycin and cefepime after samples are taken for culture.   Subjective: No significant history from patient.  Patient remains encephalopathic.  Objective: Vitals:   05/28/18 1530 05/28/18 1600 05/28/18 1622 05/28/18 1712  BP: (!) 169/114 135/89 (!) 171/109 (!) 184/107  Pulse: 80 79 72 71  Resp: 17 17 (!) 21   Temp:   98.5  F (36.9 C) 98 F (36.7 C)  TempSrc:   Oral Oral  SpO2:   99% 99%  Weight:   69.4 kg   Height:        Intake/Output Summary (Last 24 hours) at 05/28/2018 1908 Last data filed at 05/28/2018 1622 Gross per 24 hour  Intake 0 ml    Output 2500 ml  Net -2500 ml   Filed Weights   05/28/18 0517 05/28/18 1225 05/28/18 1622  Weight: 63.6 kg 71.9 kg 69.4 kg    Examination:  General exam: Awake.  Encephalopathic.  Not in any distress.   Respiratory system: Decreased air entry.  Expiratory wheeze.    Cardiovascular system: S1 & S2 heard Gastrointestinal system: Abdomen is nondistended, soft and nontender. No organomegaly or masses felt. Normal bowel sounds heard. Central nervous system: Awake.  Encephalopathic.  Won't to follow full examination.   Extremities: No leg edema.  Left upper extremity AV fistula.  Data Reviewed: I have personally reviewed following labs and imaging studies  CBC: Recent Labs  Lab 05/27/18 1715 05/27/18 1723  WBC 5.1  --   NEUTROABS 4.3  --   HGB 10.5* 12.2  HCT 35.0* 36.0  MCV 113.3*  --   PLT 147*  --    Basic Metabolic Panel: Recent Labs  Lab 05/27/18 1715 05/27/18 1723 05/28/18 0024  NA 138 137 137  K 4.0 4.0 3.7  CL 97* 97* 97*  CO2 29  --  29  GLUCOSE 101* 98 110*  BUN 44* 47* 46*  CREATININE 8.68* 9.30* 8.88*  CALCIUM 10.0  --  10.0   GFR: Estimated Creatinine Clearance: 5 mL/min (A) (by C-G formula based on SCr of 8.88 mg/dL (H)). Liver Function Tests: Recent Labs  Lab 05/27/18 1715  AST 22  ALT 11  ALKPHOS 53  BILITOT 0.9  PROT 7.5  ALBUMIN 3.4*   No results for input(s): LIPASE, AMYLASE in the last 168 hours. Recent Labs  Lab 05/28/18 0024  AMMONIA 26   Coagulation Profile: No results for input(s): INR, PROTIME in the last 168 hours. Cardiac Enzymes: No results for input(s): CKTOTAL, CKMB, CKMBINDEX, TROPONINI in the last 168 hours. BNP (last 3 results) No results for input(s): PROBNP in the last 8760 hours. HbA1C: No results for input(s): HGBA1C in the last 72 hours. CBG: Recent Labs  Lab 05/27/18 1708 05/27/18 2343  GLUCAP 103* 98   Lipid Profile: No results for input(s): CHOL, HDL, LDLCALC, TRIG, CHOLHDL, LDLDIRECT in the last 72  hours. Thyroid Function Tests: No results for input(s): TSH, T4TOTAL, FREET4, T3FREE, THYROIDAB in the last 72 hours. Anemia Panel: Recent Labs    05/28/18 0024  VITAMINB12 782   Urine analysis:    Component Value Date/Time   COLORURINE YELLOW 04/16/2018 [place] 04/16/2018 1305   LABSPEC 1.009 04/16/2018 1305   PHURINE 9.0 (H) 04/16/2018 1305   GLUCOSEU 50 (A) 04/16/2018 1305   HGBUR SMALL (A) 04/16/2018 1305   BILIRUBINUR NEGATIVE 04/16/2018 1305   KETONESUR NEGATIVE 04/16/2018 1305   PROTEINUR 100 (A) 04/16/2018 1305   UROBILINOGEN 0.2 04/10/2015 1800   NITRITE NEGATIVE 04/16/2018 1305   LEUKOCYTESUR MODERATE (A) 04/16/2018 1305   Sepsis Labs: '@LABRCNTIP' (procalcitonin:4,lacticidven:4)  ) Recent Results (from the past 240 hour(s))  MRSA PCR Screening     Status: None   Collection Time: 05/27/18  9:20 PM  Result Value Ref Range Status   MRSA by PCR NEGATIVE NEGATIVE Final    Comment:  The GeneXpert MRSA Assay (FDA approved for NASAL specimens only), is one component of a comprehensive MRSA colonization surveillance program. It is not intended to diagnose MRSA infection nor to guide or monitor treatment for MRSA infections. Performed at [facility], [place] [address], [place], [place] [address]          Radiology Studies: Dg Chest 2 View  Result Date: 05/27/2018 CLINICAL DATA:  Altered mental status EXAM: CHEST - 2 VIEW COMPARISON:  04/16/2018 FINDINGS: The lungs are well inflated. The cardiomediastinal silhouette is markedly enlarged. There is no focal airspace consolidation or pulmonary edema. There is no pleural effusion or pneumothorax. IMPRESSION: Unchanged cardiomegaly without focal airspace disease. Electronically Signed   By: Ulyses Jarred M.D.   On: 05/27/2018 17:59   Ct Head Wo Contrast  Result Date: 05/27/2018 CLINICAL DATA:  Occipital headache since yesterday, altered mental status today. EXAM: CT HEAD WITHOUT CONTRAST  TECHNIQUE: Contiguous axial images were obtained from the base of the skull through the vertex without intravenous contrast. COMPARISON:  MRI of the head April 16, 2018 FINDINGS: BRAIN: No intraparenchymal hemorrhage, mass effect nor midline shift. Moderate parenchymal brain volume loss. No hydrocephalus. No hydrocephalus. Patchy supratentorial white matter hypodensities within normal range for patient's age, though non-specific are most compatible with chronic small vessel ischemic disease. Old LEFT cerebellar infarct. Old LEFT thalamus and LEFT basal ganglia lacunar infarcts. No acute large vascular territory infarcts. No abnormal extra-axial fluid collections. Basal cisterns are patent. VASCULAR: Moderate calcific atherosclerosis of the carotid siphons, mild calcific atherosclerosis LEFT MCA. Dolichoectatic intracranial vessels associated with chronic hypertension. SKULL: No skull fracture. Mild lead thickened and dense calvarium suggesting renal osteodystrophy osteopenia. No significant scalp soft tissue swelling. SINUSES/ORBITS: Minimal paranasal sinus mucosal thickening. Mastoid air cells are well aerated. Soft tissue within the external auditory canals most compatible with cerumen. The included ocular globes and orbital contents are non-suspicious. OTHER: None. IMPRESSION: 1. No acute intracranial process. 2. Stable examination including mild-to-moderate chronic small vessel ischemic changes and old lacunar infarcts. Electronically Signed   By: Elon Alas M.D.   On: 05/27/2018 17:43        Scheduled Meds: . amLODipine  10 mg Oral Daily  . calcium acetate  2,001 mg Oral TID WC  . cloNIDine  0.2 mg Transdermal Weekly  . heparin  5,000 Units Subcutaneous Q8H  . isosorbide mononitrate  60 mg Oral Daily  . lamoTRIgine  50 mg Oral Daily  . latanoprost  1 drop Both Eyes QHS  . LORazepam  0.5 mg Oral QHS  . losartan  50 mg Oral QHS  . multivitamin  1 tablet Oral Daily  . sertraline  50 mg Oral  Daily  . sevelamer carbonate  800 mg Oral TID WC  . sodium chloride flush  3 mL Intravenous Q12H  . sodium chloride flush  3 mL Intravenous Q12H  . valACYclovir  500 mg Oral Q48H   Continuous Infusions: . sodium chloride    . famotidine (PEPCID) IV Stopped (05/28/18 0830)     LOS: 0 days    Time spent: 35 minutes.    Dana Allan, MD  [facility] Pager #: [phone] 7PM-7AM contact night coverage as above

## 2018-05-28 NOTE — Progress Notes (Signed)
Admitted from ED per bed on arrival to the floor pt was confused only knows her name and not aware  Of anything else, has generalised edema BP running high iv hydralizine give as prescribed, redness at the sacrum has pink foam dressing in place skin dry and flaky failed swallow test at ED, pt looks very uncomfortable and not stabe in bed, will continue to monitor

## 2018-05-28 NOTE — Progress Notes (Signed)
Could not give oral med pt failed swallow test and on NPO

## 2018-05-28 NOTE — Progress Notes (Signed)
Pharmacy Antibiotic Note  [name] is a 77 y.o. female admitted on 05/27/2018 with AMS and hypertensive urgency.  Pharmacy has been consulted for Vancomycin and Cefepime dosing as empiric coverage with AMS and increased bands on differential.  Hx inclueds multiple myeloma on chemotherapy. ESRD.  Refused HD on 8/27 but dialyzed x 4 hours today and plan HD again on 8/29 to get back on TTS schedule.         Afebrile, WBC 5.1. Blood and urine cultures ordered.   Plan:  Cefepime 1 gm IV and Vancomycin 1250 mg IV to begin given after blood cultures are drawn.  Cefepime with 500 mg IV q24hrs to begin on 8/29.  Vancomycin 750 mg IV after HD -TTS.  Target pre-HD vanc troughs 15-25 mcg/ml  Will follow for any changes to HD schedule, culture data and clinical progress.  Height: '5\' 7"'  (170.2 cm) Weight: 153 lb (69.4 kg) IBW/kg (Calculated) : 61.6  Temp (24hrs), Avg:98 F (36.7 C), Min:97.4 F (36.3 C), Max:98.5 F (36.9 C)  Recent Labs  Lab 05/27/18 1715 05/27/18 1723 05/28/18 0024  WBC 5.1  --   --   CREATININE 8.68* 9.30* 8.88*    ESRD  Allergies  Allergen Reactions  . Phenergan [Promethazine Hcl] Other (See Comments)    Acute encephalopathy in the context of refusal to go to hemodialysis and administration of Phenergan for nausea and vomiting    Antimicrobials this admission:  Cefepime 8/28>>  Vancomycin 8/28>>  Dose adjustments this admission:  n/a  Microbiology results:  8/28 blood x 2 -  8/28 urine -  8/28 MRSA PCR negative  Thank you for allowing pharmacy to be a part of this patient's care.  Arty Baumgartner, [place] Pager: [phone] 05/28/2018 8:08 PM

## 2018-05-28 NOTE — Progress Notes (Signed)
PT Cancellation Note  Patient Details Name: [name] MRN: [id number] DOB: April 05, 1941   Cancelled Treatment:    Reason Eval/Treat Not Completed: Patient at procedure or test/unavailable Pt currently in HD. Will follow up as schedule allows.   Leighton Ruff, PT, DPT  Acute Rehabilitation Services  Pager: [phone]  [name] 05/28/2018, 2:29 PM

## 2018-05-28 NOTE — Procedures (Signed)
I was present at this dialysis session, have reviewed the session itself and made  appropriate changes.  Jannifer Hick MD [facility] pager [phone]   05/28/2018, 1:22 PM

## 2018-05-29 DIAGNOSIS — Z992 Dependence on renal dialysis: Secondary | ICD-10-CM

## 2018-05-29 DIAGNOSIS — N186 End stage renal disease: Secondary | ICD-10-CM

## 2018-05-29 LAB — RENAL FUNCTION PANEL
Albumin: 3.2 g/dL — ABNORMAL LOW (ref 3.5–5.0)
Anion gap: 10 (ref 5–15)
BUN: 19 mg/dL (ref 8–23)
CO2: 27 mmol/L (ref 22–32)
Calcium: 9.2 mg/dL (ref 8.9–10.3)
Chloride: 99 mmol/L (ref 98–111)
Creatinine, Ser: 5.95 mg/dL — ABNORMAL HIGH (ref 0.44–1.00)
GFR calc Af Amer: 7 mL/min — ABNORMAL LOW (ref >60)
GFR calc non Af Amer: 6 mL/min — ABNORMAL LOW (ref >60)
Glucose, Bld: 74 mg/dL (ref 70–99)
Phosphorus: 2.9 mg/dL (ref 2.5–4.6)
Potassium: 3.4 mmol/L — ABNORMAL LOW (ref 3.5–5.1)
Sodium: 136 mmol/L (ref 135–145)

## 2018-05-29 LAB — CBC WITH DIFFERENTIAL/PLATELET
Abs Immature Granulocytes: 0 10*3/uL (ref 0.0–0.1)
Basophils Absolute: 0 10*3/uL (ref 0.0–0.1)
Basophils Relative: 0 %
Eosinophils Absolute: 0.1 10*3/uL (ref 0.0–0.7)
Eosinophils Relative: 1 %
HCT: 33 % — ABNORMAL LOW (ref 36.0–46.0)
Hemoglobin: 10.2 g/dL — ABNORMAL LOW (ref 12.0–15.0)
Immature Granulocytes: 0 %
Lymphocytes Relative: 10 %
Lymphs Abs: 0.5 10*3/uL — ABNORMAL LOW (ref 0.7–4.0)
MCH: 34 pg (ref 26.0–34.0)
MCHC: 30.9 g/dL (ref 30.0–36.0)
MCV: 110 fL — ABNORMAL HIGH (ref 78.0–100.0)
Monocytes Absolute: 0.3 10*3/uL (ref 0.1–1.0)
Monocytes Relative: 7 %
Neutro Abs: 3.7 10*3/uL (ref 1.7–7.7)
Neutrophils Relative %: 82 %
Platelets: 156 10*3/uL (ref 150–400)
RBC: 3 MIL/uL — ABNORMAL LOW (ref 3.87–5.11)
RDW: 15.2 % (ref 11.5–15.5)
WBC: 4.6 10*3/uL (ref 4.0–10.5)

## 2018-05-29 LAB — GLUCOSE, CAPILLARY
GLUCOSE-CAPILLARY: 80 mg/dL (ref 70–99)
Glucose-Capillary: 67 mg/dL — ABNORMAL LOW (ref 70–99)
Glucose-Capillary: 111 mg/dL — ABNORMAL HIGH (ref 70–99)
Glucose-Capillary: 85 mg/dL (ref 70–99)
Glucose-Capillary: 73 mg/dL (ref 70–99)
Glucose-Capillary: 111 mg/dL — ABNORMAL HIGH (ref 70–99)

## 2018-05-29 LAB — MAGNESIUM: Magnesium: 2.1 mg/dL (ref 1.7–2.4)

## 2018-05-29 LAB — FOLATE RBC
FOLATE, RBC: 1815 ng/mL (ref >498)
Folate, Hemolysate: 618.9 ng/mL
Hematocrit: 34.1 % (ref 34.0–46.6)

## 2018-05-29 MED ORDER — DEXTROSE 50 % IV SOLN
25.0000 mL | Freq: Once | INTRAVENOUS | Status: AC
  Administered 2018-05-29: 25 mL via INTRAVENOUS

## 2018-05-29 MED ORDER — ALBUTEROL SULFATE (2.5 MG/3ML) 0.083% IN NEBU: 2.5000 mg | INHALATION_SOLUTION | RESPIRATORY_TRACT | Status: DC | PRN

## 2018-05-29 MED ORDER — DEXTROSE 50 % IV SOLN
INTRAVENOUS | Status: AC
  Administered 2018-05-29: 25 mL via INTRAVENOUS
  Filled 2018-05-29: qty 50

## 2018-05-29 NOTE — Progress Notes (Signed)
PROGRESS NOTE    [name]  GYB:[id number] DOB: 23-Nov-1940 DOA: 05/27/2018 PCP: Hendricks Limes, MD  Outpatient Specialists:   Principal Problem:   ESRD needing dialysis [facility]) Active Problems:   Multiple myeloma ([place])   Hypertension associatd with end stage renal disease on dialysis   Anemia in neoplastic disease   Hypertensive urgency   Encephalopathy acute   Anxiety and depression   Hypertension, accelerated   Altered mental status  Brief Narrative:  [name] is a 77 y.o. female with past medical history significant for end-stage renal disease on hemodialysis, depression with anxiety, multiple myeloma on chemotherapy, and hypertension.  Patient presented from the skilled nursing facility to the hospital with altered mentation, severe hypertension and garbled speech after refusing dialysis and medications the day of admission.  She was seen in the emergency department a day prior to admission with severe hypertension after refusing her medications, agreed to take her usual antihypertensives in the ED and her condition improved.  She then went on to refuse dialysis on the morning of admission, and refused her medications again, and again becoming severely hypertensive and has developed garbled speech.  She had a very similar presentation last month and her condition normalized with hemodialysis and blood pressure control.    Work-up done revealed bandemia.  We will panculture patient.  We will go ahead and start patient on IV antibiotics.  Will have a low threshold to consult oncology team (patient is on treatment for multiple myeloma).   Assessment & Plan:    Acute encephalopathy:---  - Speech pathologist eval appreciated, patient speech appears to be improving -CT head without acute findings,- She had very similar presentation last month, had normal TSH and non-acute MRI brain, and she returned to baseline with HD and BP-control  -05/28/2018: Bandemia is noted.  Continue  antibiotics pending culture.  Low threshold to consult oncology/hematology team   ESRD; hypertensive urgency  - Systolic BP was over 876 diastolic over 811 earlier today due to noncompliance ,patient refused HD on admission,, refused medications, and has been markedly hypertensive at her SNF  -- Nephrology consulting and much appreciated, had HD  on 05/29/2018 - Continue Norvasc, clonidine, and losartan, and use hydralazine IVP's as needed    Multiple myeloma: - Follows with heme/onc at [facility] and managed with carfilzomib infusions  - Counts appear stable   05/20/2018: CBC reveals bandemia.  Low threshold to consult hematology oncology team.  Depression with anxiety  - Continue Zoloft, Lamictal, prn Ativan   Disposition--- possible discharge back to facility on 05/30/2018 if patient continues to improve neurologically and if BP remains stable and if okay with nephrology team.... I believe patient's speech or neuro problems will probably resolve and patient will be back to baseline after compliance with hemodialysis and compliance with BP medications with better BP control.  Currently on antibiotics pending cultures if cultures are negative on 05/30/2018 may discontinue antibiotics  DVT prophylaxis: sq heparin  Code Status: Full  Family Communication:  Patient's brother.    Consults called: Nephrology.  Low threshold to consult hematology/oncology team.  Antimicrobials:    IV vancomycin and cefepime after samples are taken for culture.   Subjective: Agitated at times, confused and encephalopathic, not cooperative, did tolerate hemodialysis earlier today  Objective: Vitals:   05/29/18 1100 05/29/18 1117 05/29/18 1543 05/29/18 1543  BP: (!) 152/96 (!) 160/99 (!) 130/91 (!) 130/91  Pulse: 75 74 78 78  Resp:  '16 20 20  ' Temp:  98.5 F (  36.9 C) 99.7 F (37.6 C) 99.7 F (37.6 C)  TempSrc:  Oral Oral   SpO2:  100% 99% 99%  Weight:  57.4 kg    Height:        Intake/Output Summary  (Last 24 hours) at 05/29/2018 1855 Last data filed at 05/29/2018 1117 Gross per 24 hour  Intake 703.61 ml  Output 2500 ml  Net -1796.39 ml   Filed Weights   05/28/18 1622 05/29/18 0725 05/29/18 1117  Weight: 69.4 kg 60.6 kg 57.4 kg    Examination:  General exam: Awake.  Encephalopathic.  Not in any distress.   Respiratory system: Decreased air entry.  No further wheezing Cardiovascular system: S1 & S2 heard Gastrointestinal system: Abdomen is nondistended, soft and nontender. No organomegaly or masses felt. Normal bowel sounds heard. Central nervous system: Awake.  Encephalopathic.  Not very cooperative with exam.   Extremities: No leg edema.  Left upper extremity AV fistula with thrill and bruit   CBC: Recent Labs  Lab 05/27/18 1715 05/27/18 1723 05/28/18 0024 05/29/18 0526  WBC 5.1  --   --  4.6  NEUTROABS 4.3  --   --  3.7  HGB 10.5* 12.2  --  10.2*  HCT 35.0* 36.0 34.1 33.0*  MCV 113.3*  --   --  110.0*  PLT 147*  --   --  244   Basic Metabolic Panel: Recent Labs  Lab 05/27/18 1715 05/27/18 1723 05/28/18 0024 05/29/18 0526  NA 138 137 137 136  K 4.0 4.0 3.7 3.4*  CL 97* 97* 97* 99  CO2 29  --  29 27  GLUCOSE 101* 98 110* 74  BUN 44* 47* 46* 19  CREATININE 8.68* 9.30* 8.88* 5.95*  CALCIUM 10.0  --  10.0 9.2  MG  --   --   --  2.1  PHOS  --   --   --  2.9   GFR: Estimated Creatinine Clearance: 6.9 mL/min (A) (by C-G formula based on SCr of 5.95 mg/dL (H)). Liver Function Tests: Recent Labs  Lab 05/27/18 1715 05/29/18 0526  AST 22  --   ALT 11  --   ALKPHOS 53  --   BILITOT 0.9  --   PROT 7.5  --   ALBUMIN 3.4* 3.2*   No results for input(s): LIPASE, AMYLASE in the last 168 hours. Recent Labs  Lab 05/28/18 0024  AMMONIA 26   Coagulation Profile: No results for input(s): INR, PROTIME in the last 168 hours. Cardiac Enzymes: No results for input(s): CKTOTAL, CKMB, CKMBINDEX, TROPONINI in the last 168 hours. BNP (last 3 results) No results for  input(s): PROBNP in the last 8760 hours. HbA1C: No results for input(s): HGBA1C in the last 72 hours. CBG: Recent Labs  Lab 05/29/18 0017 05/29/18 0321 05/29/18 0350 05/29/18 1444 05/29/18 1613  GLUCAP 80 67* 111* 85 73   Lipid Profile: No results for input(s): CHOL, HDL, LDLCALC, TRIG, CHOLHDL, LDLDIRECT in the last 72 hours. Thyroid Function Tests: No results for input(s): TSH, T4TOTAL, FREET4, T3FREE, THYROIDAB in the last 72 hours. Anemia Panel: Recent Labs    05/28/18 0024  VITAMINB12 782   Urine analysis:    Component Value Date/Time   COLORURINE YELLOW 04/16/2018 [place] 04/16/2018 1305   LABSPEC 1.009 04/16/2018 1305   PHURINE 9.0 (H) 04/16/2018 1305   GLUCOSEU 50 (A) 04/16/2018 1305   HGBUR SMALL (A) 04/16/2018 1305   BILIRUBINUR NEGATIVE 04/16/2018 1305   KETONESUR NEGATIVE 04/16/2018  [place] (A) 04/16/2018 1305   UROBILINOGEN 0.2 04/10/2015 1800   NITRITE NEGATIVE 04/16/2018 1305   LEUKOCYTESUR MODERATE (A) 04/16/2018 1305   Sepsis Labs: '@LABRCNTIP' (procalcitonin:4,lacticidven:4)  ) Recent Results (from the past 240 hour(s))  MRSA PCR Screening     Status: None   Collection Time: 05/27/18  9:20 PM  Result Value Ref Range Status   MRSA by PCR NEGATIVE NEGATIVE Final    Comment:        The GeneXpert MRSA Assay (FDA approved for NASAL specimens only), is one component of a comprehensive MRSA colonization surveillance program. It is not intended to diagnose MRSA infection nor to guide or monitor treatment for MRSA infections. Performed at [facility], [place] [address], [place], [place] [address]   Culture, blood (routine x 2)     Status: None (Preliminary result)   Collection Time: 05/28/18  8:20 PM  Result Value Ref Range Status   Specimen Description BLOOD RIGHT ANTECUBITAL  Final   Special Requests   Final    BOTTLES DRAWN AEROBIC AND ANAEROBIC Blood Culture adequate volume   Culture   Final    NO GROWTH <  12 HOURS Performed at [facility], [place] [address], [place], [place] [address]    Report Status PENDING  Incomplete  Culture, blood (routine x 2)     Status: None (Preliminary result)   Collection Time: 05/28/18  8:28 PM  Result Value Ref Range Status   Specimen Description BLOOD RIGHT HAND  Final   Special Requests   Final    BOTTLES DRAWN AEROBIC ONLY Blood Culture adequate volume   Culture   Final    NO GROWTH < 12 HOURS Performed at [facility], [place] [address] [place], [place] [address]    Report Status PENDING  Incomplete     Radiology Studies: No results found.  Scheduled Meds: . amLODipine  10 mg Oral Daily  . calcium acetate  2,001 mg Oral TID WC  . ceFEPime (MAXIPIME) IV  500 mg Intravenous Q24H  . cloNIDine  0.2 mg Transdermal Weekly  . heparin  5,000 Units Subcutaneous Q8H  . ipratropium-albuterol  3 mL Nebulization BID  . isosorbide mononitrate  60 mg Oral Daily  . lamoTRIgine  50 mg Oral Daily  . latanoprost  1 drop Both Eyes QHS  . LORazepam  0.5 mg Oral QHS  . losartan  50 mg Oral QHS  . multivitamin  1 tablet Oral Daily  . sertraline  50 mg Oral Daily  . sevelamer carbonate  800 mg Oral TID WC  . sodium chloride flush  3 mL Intravenous Q12H  . sodium chloride flush  3 mL Intravenous Q12H  . valACYclovir  500 mg Oral Q48H   Continuous Infusions: . sodium chloride Stopped (05/29/18 0629)  . famotidine (PEPCID) IV Stopped (05/29/18 0224)  . vancomycin Stopped (05/29/18 1110)     LOS: 1 day   Roxan Hockey, MD  [facility]  7PM-7AM contact night coverage as above

## 2018-05-29 NOTE — Evaluation (Signed)
Physical Therapy Evaluation Patient Details Name: [name] MRN: [id number] DOB: 04-11-1941 Today's Date: 05/29/2018   History of Present Illness  Patient is a 77 y/o female admitted with AMS from [facility] due to being non compliant with meds and skipping dialysis. Found to have acute encephalopathy. PMH includes ESRD on HD, HTN, depression, anxiety.   Clinical Impression  Patient presents with generalized weakness, confusion, impaired attention, poor safety awareness and impaired mobility s/p above. Tolerated standing and taking a few steps to get to chair with Min-Mod A. Pt not following commands consistently, "watch me, let me know, I got this," despite weakness and losing balance. Pt from [place] and uses w/c for mobility mostly but interested in ambulating again and wants to regain strength. Recommend PT at [facility] to improve overall mobility and decrease falls. Will follow acutely.     Follow Up Recommendations SNF;Supervision for mobility/OOB    Equipment Recommendations  None recommended by PT    Recommendations for Other Services       Precautions / Restrictions Precautions Precautions: Fall Restrictions Weight Bearing Restrictions: No      Mobility  Bed Mobility Overal bed mobility: Needs Assistance Bed Mobility: Supine to Sit     Supine to sit: Supervision;HOB elevated     General bed mobility comments: no assist needed, increased time.  Transfers Overall transfer level: Needs assistance Equipment used: Rolling walker (2 wheeled) Transfers: Sit to/from Stand Sit to Stand: Mod assist         General transfer comment: Assist to power to standing with difficulty straightening at knees. Cues for upright posture.   Ambulation/Gait Ambulation/Gait assistance: Min assist Gait Distance (Feet): 5 Feet Assistive device: Rolling walker (2 wheeled) Gait Pattern/deviations: Trunk flexed;Step-to pattern;Step-through pattern;Decreased step length -  right;Decreased step length - left Gait velocity: decreased   General Gait Details: Able tot ake a few steps to get to chair, attempting to ambulate forward despite cues to sit in chair, unsafe.   Stairs            Wheelchair Mobility    Modified Rankin (Stroke Patients Only)       Balance Overall balance assessment: Needs assistance;History of Falls Sitting-balance support: Feet supported;No upper extremity supported Sitting balance-Leahy Scale: Good     Standing balance support: During functional activity;Bilateral upper extremity supported Standing balance-Leahy Scale: Poor                               Pertinent Vitals/Pain Pain Assessment: No/denies pain    Home Living Family/patient expects to be discharged to:: Skilled nursing facility                 Additional Comments: [facility]    Prior Function Level of Independence: Needs assistance   Gait / Transfers Assistance Needed: Uses w/c for mobility per daughter and her. Mod I for transfers but daughter reports falls.  ADL's / Homemaking Assistance Needed: Reports she can do her own ADLs, not sure of accuracy        Hand Dominance   Dominant Hand: Right    Extremity/Trunk Assessment   Upper Extremity Assessment Upper Extremity Assessment: Defer to OT evaluation    Lower Extremity Assessment Lower Extremity Assessment: RLE deficits/detail;LLE deficits/detail RLE Deficits / Details: Grossly ~2+/5 throughout LLE Deficits / Details: Grossly ~3+/5 throughout    Cervical / Trunk Assessment Cervical / Trunk Assessment: Kyphotic  Communication   Communication: No difficulties  Cognition Arousal/Alertness:  Awake/alert Behavior During Therapy: WFL for tasks assessed/performed Overall Cognitive Status: Impaired/Different from baseline Area of Impairment: Orientation;Attention;Following commands;Awareness;Safety/judgement                 Orientation Level: Disoriented  to;Situation Current Attention Level: Selective   Following Commands: Follows multi-step commands inconsistently Safety/Judgement: Decreased awareness of safety;Decreased awareness of deficits Awareness: Intellectual   General Comments: "I can walk if I want too, let me go, i can do it" as she is losing her balance.       General Comments General comments (skin integrity, edema, etc.): Daughter and grandson present.    Exercises     Assessment/Plan    PT Assessment Patient needs continued PT services  PT Problem List Decreased strength;Decreased mobility;Decreased safety awareness;Decreased balance;Decreased cognition;Decreased activity tolerance       PT Treatment Interventions Functional mobility training;Balance training;Patient/family education;Gait training;Therapeutic activities;Neuromuscular re-education;Wheelchair mobility training;Therapeutic exercise;Cognitive remediation;DME instruction    PT Goals (Current goals can be found in the Care Plan section)  Acute Rehab PT Goals Patient Stated Goal: get stronger and start walking again PT Goal Formulation: With patient/family Time For Goal Achievement: 06/12/18 Potential to Achieve Goals: Fair    Frequency Min 2X/week   Barriers to discharge        Co-evaluation               AM-PAC PT "6 Clicks" Daily Activity  Outcome Measure Difficulty turning over in bed (including adjusting bedclothes, sheets and blankets)?: None Difficulty moving from lying on back to sitting on the side of the bed? : None Difficulty sitting down on and standing up from a chair with arms (e.g., wheelchair, bedside commode, etc,.)?: Unable Help needed moving to and from a bed to chair (including a wheelchair)?: A Little Help needed walking in hospital room?: A Little Help needed climbing 3-5 steps with a railing? : A Lot 6 Click Score: 17    End of Session Equipment Utilized During Treatment: Gait belt Activity Tolerance: Patient  tolerated treatment well;Patient limited by fatigue Patient left: in chair;with nursing/sitter in room;with family/visitor present;with call bell/phone within reach Nurse Communication: Mobility status PT Visit Diagnosis: Muscle weakness (generalized) (M62.81);Unsteadiness on feet (R26.81);Difficulty in walking, not elsewhere classified (R26.2)    Time: 5638-9373 PT Time Calculation (min) (ACUTE ONLY): 22 min   Charges:   PT Evaluation $PT Eval Low Complexity: [place], [place], DPT [phone]    Lacie Draft 05/29/2018, 3:54 PM

## 2018-05-29 NOTE — Progress Notes (Signed)
PT Cancellation Note  Patient Details Name: [name] MRN: [id number] DOB: Apr 28, 1941   Cancelled Treatment:    Reason Eval/Treat Not Completed: Patient at procedure or test/unavailable Pt off floor at HD. Will follow up as time allows.   Marguarite Arbour A Kamaury Cutbirth 05/29/2018, 12:24 PM Wray Kearns, [place], DPT [phone]

## 2018-05-29 NOTE — NC FL2 (Signed)
[place] MEDICAID FL2 LEVEL OF CARE SCREENING TOOL     IDENTIFICATION  Patient Name: [name] Birthdate: 1941-06-30 Sex: female Admission Date (Current Location): 05/27/2018  [facility] and [place] Number:  Herbalist and Address:  [facility] [place], [place] [address] [place], [place] [address]      Provider Number: [id number]  Attending Physician Name and Address:  Roxan Hockey, MD  Relative Name and Phone Number:  [name], daughter, [phone]    Current Level of Care: Hospital Recommended Level of Care: [place] Prior Approval Number:    Date Approved/Denied:   PASRR Number: [id number]  Discharge Plan: SNF    Current Diagnoses: Patient Active Problem List   Diagnosis Date Noted  . Altered mental status 05/28/2018  . Macrocytosis 05/01/2018  . Hypertension, uncontrolled 04/16/2018  . ESRD needing dialysis ([place]) 04/16/2018  . Encephalopathy acute 04/16/2018  . Anxiety and depression 04/16/2018  . Hypertension, accelerated 04/16/2018  . Pressure ulcer, stage I 04/16/2018  . Abnormal chest x-ray 12/26/2017  . HCAP (healthcare-associated pneumonia) 04/24/2017  . Sepsis ([place]) 04/24/2017  . Nausea and vomiting 04/24/2017  . Thrombocytopenia ([place]) 04/24/2017  . Hyperkalemia 01/16/2017  . IBS (irritable bowel syndrome) 12/27/2016  . Mood disorder ([place]) 07/19/2016  . Diastolic dysfunction 71/03/2693  . Insomnia 04/27/2016  . Hyponatremia 03/12/2016  . Diarrhea 03/12/2016  . Hypertensive urgency 02/18/2016  . Swelling of joint of left knee 02/18/2016  . Leukopenia due to antineoplastic chemotherapy ([place]) 09/03/2015  . Malnutrition of moderate degree 09/02/2015  . GERD (gastroesophageal reflux disease) 04/23/2015  . 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 Multiple myeloma ([place]) 03/11/2014  . Hypertension associatd with end stage renal disease on dialysis 03/11/2014    Orientation RESPIRATION BLADDER Height &  Weight     Self, Time, Place  Normal Continent, External catheter Weight: 57.4 kg Height:  _0  (170.2 cm)  BEHAVIORAL SYMPTOMS/MOOD NEUROLOGICAL BOWEL NUTRITION STATUS      Incontinent Diet(Please see DC Summary)  AMBULATORY STATUS COMMUNICATION OF NEEDS Skin   Extensive Assist Verbally Normal                       Personal Care Assistance Level of Assistance  Bathing, Feeding, Dressing Bathing Assistance: Maximum assistance Feeding assistance: Limited assistance Dressing Assistance: Limited assistance     Functional Limitations Info  Sight, Hearing, Speech Sight Info: Adequate Hearing Info: Adequate Speech Info: Adequate    SPECIAL CARE FACTORS FREQUENCY  PT (By licensed PT)     PT Frequency: 5x/week              Contractures      Additional Factors Info  Code Status, Allergies, Isolation Precautions, Psychotropic Code Status Info: Full Allergies Info: Phenergan Promethazine Hcl Psychotropic Info: clonidine;Lamictal;Ativan;Zoloft   Isolation Precautions Info: MRSA     Current Medications (05/29/2018):  This is the current hospital active medication list Current Facility-Administered Medications  Medication Dose Route Frequency Provider Last Rate Last Dose  . 0.9 %  sodium chloride infusion  250 mL Intravenous PRN Opyd, Ilene Qua, MD   Stopped at 05/29/18 [phone]  . acetaminophen (TYLENOL) tablet 650 mg  650 mg Oral Q6H PRN Opyd, Ilene Qua, MD       Or  . acetaminophen (TYLENOL) suppository 650 mg  650 mg Rectal Q6H PRN Opyd, Ilene Qua, MD      . amLODipine (NORVASC) tablet 10 mg  10 mg Oral Daily Opyd, Timothy S,  MD   10 mg at 05/29/18 1443  . bisacodyl (DULCOLAX) suppository 10 mg  10 mg Rectal Once PRN Opyd, Ilene Qua, MD      . calcium acetate (PHOSLO) capsule 2,001 mg  2,001 mg Oral TID WC Opyd, Ilene Qua, MD      . ceFEPIme (MAXIPIME) 500 mg in dextrose 5 % 50 mL IVPB  500 mg Intravenous Q24H Kelvin Cellar D, RPH      . cloNIDine (CATAPRES - Dosed in  mg/24 hr) patch 0.2 mg  0.2 mg Transdermal Weekly Opyd, Ilene Qua, MD      . famotidine (PEPCID) IVPB 20 mg premix  20 mg Intravenous Q24H Kirby-Graham, Karsten Fells, NP   Stopped at 05/29/18 0224  . heparin injection 5,000 Units  5,000 Units Subcutaneous Q8H Opyd, Ilene Qua, MD   5,000 Units at 05/29/18 1447  . hydrALAZINE (APRESOLINE) injection 10 mg  10 mg Intravenous Q4H PRN Opyd, Ilene Qua, MD   10 mg at 05/29/18 0044  . ipratropium-albuterol (DUONEB) 0.5-2.5 (3) MG/3ML nebulizer solution 3 mL  3 mL Nebulization BID Dana Allan I, MD      . isosorbide mononitrate (IMDUR) 24 hr tablet 60 mg  60 mg Oral Daily Opyd, Ilene Qua, MD   60 mg at 05/29/18 1444  . lamoTRIgine (LAMICTAL) tablet 50 mg  50 mg Oral Daily Opyd, Ilene Qua, MD   50 mg at 05/29/18 1442  . latanoprost (XALATAN) 0.005 % ophthalmic solution 1 drop  1 drop Both Eyes QHS Opyd, Ilene Qua, MD   1 drop at 05/28/18 2345  . LORazepam (ATIVAN) tablet 0.5 mg  0.5 mg Oral QHS Opyd, Ilene Qua, MD      . losartan (COZAAR) tablet 50 mg  50 mg Oral QHS Opyd, Ilene Qua, MD      . multivitamin (RENA-VIT) tablet 1 tablet  1 tablet Oral Daily Opyd, Ilene Qua, MD   1 tablet at 05/29/18 1443  . ondansetron (ZOFRAN) tablet 4 mg  4 mg Oral Q6H PRN Opyd, Ilene Qua, MD       Or  . ondansetron (ZOFRAN) injection 4 mg  4 mg Intravenous Q6H PRN Opyd, Ilene Qua, MD      . sertraline (ZOLOFT) tablet 50 mg  50 mg Oral Daily Opyd, Ilene Qua, MD   50 mg at 05/29/18 1444  . sevelamer carbonate (RENVELA) tablet 800 mg  800 mg Oral TID WC Opyd, Timothy S, MD      . sodium chloride flush (NS) 0.9 % injection 3 mL  3 mL Intravenous Q12H Opyd, Ilene Qua, MD   3 mL at 05/29/18 1445  . sodium chloride flush (NS) 0.9 % injection 3 mL  3 mL Intravenous Q12H Opyd, Ilene Qua, MD   3 mL at 05/29/18 1445  . sodium chloride flush (NS) 0.9 % injection 3 mL  3 mL Intravenous PRN Opyd, Ilene Qua, MD      . valACYclovir (VALTREX) tablet 500 mg  500 mg Oral Q48H Opyd, Ilene Qua,  MD      . vancomycin (VANCOCIN) IVPB 750 mg/150 ml premix  750 mg Intravenous Q T,Th,Sa-HD Skeet Simmer, [facility]   Stopped at 05/29/18 1110     Discharge Medications: Please see discharge summary for a list of discharge medications.  Relevant Imaging Results:  Relevant Lab Results:   Additional Information [id number] [place], [place]

## 2018-05-29 NOTE — Progress Notes (Signed)
Subjective:  Says feels ok this AM, just hungry.   Objective Vital signs in last 24 hours: Vitals:   05/28/18 2031 05/29/18 0036 05/29/18 0311 05/29/18 0340  BP: (!) 166/99 (!) 176/116 (!) 161/95 (!) 170/96  Pulse: 87 84 79 79  Resp: 18 20 18 20   Temp: 99.2 F (37.3 C) 99.6 F (37.6 C) 99.3 F (37.4 C) 99.5 F (37.5 C)  TempSrc: Oral Oral Oral Oral  SpO2: 98% 97% 100% 96%  Weight:      Height:       Weight change: 9 kg  Intake/Output Summary (Last 24 hours) at 05/29/2018 0737 Last data filed at 05/29/2018 5852 Gross per 24 hour  Intake 703.61 ml  Output 2500 ml  Net -1796.39 ml   Dialysis Orders: Center: [facility]  on TTS  .3.5 hrs  EDW 63 kg (did leave below edw to 62  Last tx )  2k, 2.25 ca   Heparin NONE. Access LUA AVF      EPO 19,000 q hd (last op hgb 9.9)   Assessment/Plan  1. ESRD -  HD TTS , Missed HD Tuesday so made up with HD yesterday, resume schedule today. 2. AMS/ Encephalopathy  - She is alert and oriented this AM (year is 1920 then 2010 but otherwise oriented).  W/u per primary; on empiric abx, BP control, HD are interventions to date.   3. Hypertension/volume  - BP generally better since admission - into the 160-170s this AM.  Home meds have been resumed.  Her bed weights are not accurate this AM - she had 2.5L UF yesterday, will try for 2.5L again today.  Will need a standing weight, discussed with RN. 4. Anemia  - HGB >12 no esa (was on epo as op ) 5. Metabolic bone disease -  No vit d , binder when po fu ca/phos trend 6. HO depression / Dementia/Old cva- at snh  , on meds  Per admit Zoloft, Lamictal, prn Ativan   Justin Mend    Labs: Basic Metabolic Panel: Recent Labs  Lab 05/27/18 1715 05/27/18 1723 05/28/18 0024 05/29/18 0526  NA 138 137 137 136  K 4.0 4.0 3.7 3.4*  CL 97* 97* 97* 99  CO2 29  --  29 27  GLUCOSE 101* 98 110* 74  BUN 44* 47* 46* 19  CREATININE 8.68* 9.30* 8.88* 5.95*  CALCIUM 10.0  --  10.0 9.2  PHOS  --   --    --  2.9   Liver Function Tests: Recent Labs  Lab 05/27/18 1715 05/29/18 0526  AST 22  --   ALT 11  --   ALKPHOS 53  --   BILITOT 0.9  --   PROT 7.5  --   ALBUMIN 3.4* 3.2*   No results for input(s): LIPASE, AMYLASE in the last 168 hours. Recent Labs  Lab 05/28/18 0024  AMMONIA 26   CBC: Recent Labs  Lab 05/27/18 1715 05/27/18 1723 05/29/18 0526  WBC 5.1  --  4.6  NEUTROABS 4.3  --  3.7  HGB 10.5* 12.2 10.2*  HCT 35.0* 36.0 33.0*  MCV 113.3*  --  110.0*  PLT 147*  --  156   Cardiac Enzymes: No results for input(s): CKTOTAL, CKMB, CKMBINDEX, TROPONINI in the last 168 hours. CBG: Recent Labs  Lab 05/27/18 2343 05/28/18 2108 05/29/18 0017 05/29/18 0321 05/29/18 0350  GLUCAP 98 81 80 67* 111*    Iron Studies: No results for input(s): IRON, TIBC, TRANSFERRIN,  FERRITIN in the last 72 hours. Studies/Results: Dg Chest 2 View  Result Date: 05/27/2018 CLINICAL DATA:  Altered mental status EXAM: CHEST - 2 VIEW COMPARISON:  04/16/2018 FINDINGS: The lungs are well inflated. The cardiomediastinal silhouette is markedly enlarged. There is no focal airspace consolidation or pulmonary edema. There is no pleural effusion or pneumothorax. IMPRESSION: Unchanged cardiomegaly without focal airspace disease. Electronically Signed   By: Ulyses Jarred M.D.   On: 05/27/2018 17:59   Ct Head Wo Contrast  Result Date: 05/27/2018 CLINICAL DATA:  Occipital headache since yesterday, altered mental status today. EXAM: CT HEAD WITHOUT CONTRAST TECHNIQUE: Contiguous axial images were obtained from the base of the skull through the vertex without intravenous contrast. COMPARISON:  MRI of the head April 16, 2018 FINDINGS: BRAIN: No intraparenchymal hemorrhage, mass effect nor midline shift. Moderate parenchymal brain volume loss. No hydrocephalus. No hydrocephalus. Patchy supratentorial white matter hypodensities within normal range for patient's age, though non-specific are most compatible with  chronic small vessel ischemic disease. Old LEFT cerebellar infarct. Old LEFT thalamus and LEFT basal ganglia lacunar infarcts. No acute large vascular territory infarcts. No abnormal extra-axial fluid collections. Basal cisterns are patent. VASCULAR: Moderate calcific atherosclerosis of the carotid siphons, mild calcific atherosclerosis LEFT MCA. Dolichoectatic intracranial vessels associated with chronic hypertension. SKULL: No skull fracture. Mild lead thickened and dense calvarium suggesting renal osteodystrophy osteopenia. No significant scalp soft tissue swelling. SINUSES/ORBITS: Minimal paranasal sinus mucosal thickening. Mastoid air cells are well aerated. Soft tissue within the external auditory canals most compatible with cerumen. The included ocular globes and orbital contents are non-suspicious. OTHER: None. IMPRESSION: 1. No acute intracranial process. 2. Stable examination including mild-to-moderate chronic small vessel ischemic changes and old lacunar infarcts. Electronically Signed   By: Elon Alas M.D.   On: 05/27/2018 17:43   Medications: Infusions: . sodium chloride Stopped (05/29/18 0629)  . famotidine (PEPCID) IV Stopped (05/29/18 0224)  . vancomycin      Scheduled Medications: . amLODipine  10 mg Oral Daily  . calcium acetate  2,001 mg Oral TID WC  . ceFEPime (MAXIPIME) IV  500 mg Intravenous Q24H  . cloNIDine  0.2 mg Transdermal Weekly  . heparin  5,000 Units Subcutaneous Q8H  . ipratropium-albuterol  3 mL Nebulization BID  . isosorbide mononitrate  60 mg Oral Daily  . lamoTRIgine  50 mg Oral Daily  . latanoprost  1 drop Both Eyes QHS  . LORazepam  0.5 mg Oral QHS  . losartan  50 mg Oral QHS  . multivitamin  1 tablet Oral Daily  . sertraline  50 mg Oral Daily  . sevelamer carbonate  800 mg Oral TID WC  . sodium chloride flush  3 mL Intravenous Q12H  . sodium chloride flush  3 mL Intravenous Q12H  . valACYclovir  500 mg Oral Q48H    have reviewed scheduled and  prn medications.  Physical Exam: General: awake, alert, comfortably flat in bed Heart: RRR, II/VI SEM Lungs: clear Abdomen: soft Extremities: no edema Dialysis Access: LUE AV access in use currently   05/29/2018,7:37 AM  LOS: 1 day

## 2018-05-29 NOTE — Evaluation (Signed)
Clinical/Bedside Swallow Evaluation Patient Details  Name: [name] MRN: [id number] Date of Birth: Apr 19, 1941  Today's Date: 05/29/2018 Time: SLP Start Time (ACUTE ONLY): 53 SLP Stop Time (ACUTE ONLY): 1440 SLP Time Calculation (min) (ACUTE ONLY): 35 min  Past Medical History:  Past Medical History:  Diagnosis Date  . Anxiety   . Closed fracture of right distal femur ([place]) 09/01/2015  . Depression   . ESRD (end stage renal disease) on dialysis [name])    "TTS; [facility]" (04/24/2017)  . GERD (gastroesophageal reflux disease) 04/23/2015  . HCAP (healthcare-associated pneumonia) 04/24/2017  . Heart murmur   . History of blood transfusion    "low HgB when I was going to dialysis [name]"  . Hypertension   . Hypertension associatd with end stage renal disease on dialysis 03/11/2014  . Malnutrition of moderate degree 09/02/2015  . Multiple myeloma ([place]) 03/11/2014  . Patient is Jehovah's Witness    "I'd rather never have any blood transfusions unless absolutely necessary; please check with me 1st". (04/24/2017)  . Pneumonia ~ 2016   Past Surgical History:  Past Surgical History:  Procedure Laterality Date  . AV FISTULA PLACEMENT Left   . I&D EXTREMITY Left 02/27/2016   Procedure: ARTHROSCOPIC IRRIGATION AND DEBRIDEMENT EXTREMITY;  Surgeon: Renette Butters, MD;  Location: [facility];  Service: Orthopedics;  Laterality: Left;  . TEE WITHOUT CARDIOVERSION N/A 02/29/2016   Procedure: TRANSESOPHAGEAL ECHOCARDIOGRAM (TEE);  Surgeon: Thayer Headings, MD;  Location: [facility] ENDOSCOPY;  Service: Cardiovascular;  Laterality: N/A;   HPI:  HPI: Pt is a 77 y.o. female with end-stage renal disease on hemodialysis and hx of depression, and anxiety, multiple myeloma on chemotherapy, HTN, heart murmur, GERD and PNA. Per MD note, pt presented to the ED 8/26 from her SNF with altered mental state, headache, severe HTN and garbled speech after refusing dialysis and medications. Head CT revealed no acute intracranial  process, mild-mod small vessel ischemis changes and old lacunar infarcts. CXR without focal airspace disease or PE. ED RN noted pt had difficulty swallowing during medication administration evening of 05/29/18.   Assessment / Plan / Recommendation Clinical Impression  PT seen at bedside for assessment of swallow function and safety, and identification of least restrictive diet. Pt has upper dentures, but is edentulous lower. She reports tolerating regular solids at [facility] without difficulty. Pt exhibited no oral difficulty or delay, and no overt s/s aspiration on any consistency tested today. Recommend regular solids and thin liquids with meds given one at a time with liquid. ST will follow for assessment of diet tolerance and education. Safe swallow precautions posted at [facility].    SLP Visit Diagnosis: Dysphagia, unspecified (R13.10)    Aspiration Risk  Mild aspiration risk    Diet Recommendation Regular;Thin liquid   Liquid Administration via: Cup;Straw Medication Administration: Whole meds with liquid Supervision: Patient able to self feed;Full supervision/cueing for compensatory strategies Compensations: Minimize environmental distractions;Small sips/bites;Slow rate Postural Changes: Seated upright at 90 degrees;Remain upright for at least 30 minutes after po intake    Other  Recommendations Oral Care Recommendations: Oral care BID   Follow up Recommendations None      Frequency and Duration min 1 x/week  1 week       Prognosis Prognosis for Safe Diet Advancement: Good      Swallow Study   General Date of Onset: 05/27/18 HPI: HPI: Pt is a 77 y.o. female with end-stage renal disease on hemodialysis and hx of depression, and anxiety, multiple myeloma on  chemotherapy, HTN, heart murmur, GERD and PNA. Per MD note, pt presented to the ED 8/26 from her SNF with altered mental state, headache, severe HTN and garbled speech after refusing dialysis and medications. Head CT revealed no  acute intracranial process, mild-mod small vessel ischemis changes and old lacunar infarcts. CXR without focal airspace disease or PE. ED RN noted pt had difficulty swallowing during medication administration evening of 05/29/18. Type of Study: Bedside Swallow Evaluation Previous Swallow Assessment: MBS July 2019 - oral and esophageal dysphagia. No penetration or aspiration. Recommended dys 3 (mech soft) solids and thin liquids. Diet Prior to this Study: NPO Temperature Spikes Noted: No Respiratory Status: Room air History of Recent Intubation: No Behavior/Cognition: Alert;Cooperative;Pleasant mood Oral Cavity Assessment: Within Functional Limits Oral Care Completed by SLP: No Oral Cavity - Dentition: Dentures, top Self-Feeding Abilities: Able to feed self Patient Positioning: Upright in bed Baseline Vocal Quality: Normal Volitional Cough: Strong Volitional Swallow: Able to elicit    Oral/Motor/Sensory Function Overall Oral Motor/Sensory Function: Within functional limits   Ice Chips Ice chips: Not tested   Thin Liquid Thin Liquid: Within functional limits Presentation: Cup;Self Fed    Nectar Thick Nectar Thick Liquid: Not tested   Honey Thick Honey Thick Liquid: Not tested   Puree Puree: Within functional limits Presentation: Self Fed;Spoon   Solid     Solid: Within functional limits Presentation: [name], [name] Speech Language Pathologist [phone]  [name] 05/29/2018,2:46 PM

## 2018-05-30 LAB — GLUCOSE, CAPILLARY
Glucose-Capillary: 109 mg/dL — ABNORMAL HIGH (ref 70–99)
Glucose-Capillary: 69 mg/dL — ABNORMAL LOW (ref 70–99)
Glucose-Capillary: 76 mg/dL (ref 70–99)
Glucose-Capillary: 77 mg/dL (ref 70–99)
Glucose-Capillary: 82 mg/dL (ref 70–99)

## 2018-05-30 MED ORDER — CLONIDINE 0.2 MG/24HR TD PTWK: 0.2000 mg | MEDICATED_PATCH | TRANSDERMAL | 12 refills | Status: DC

## 2018-05-30 MED ORDER — SEVELAMER CARBONATE 800 MG PO TABS
1600.0000 mg | ORAL_TABLET | Freq: Three times a day (TID) | ORAL | Status: DC
  Administered 2018-05-30: 1600 mg via ORAL
  Filled 2018-05-30: qty 2

## 2018-05-30 MED ORDER — CHLORHEXIDINE GLUCONATE CLOTH 2 % EX PADS
6.0000 | MEDICATED_PAD | Freq: Every day | CUTANEOUS | Status: DC
  Administered 2018-05-30: 6 via TOPICAL

## 2018-05-30 MED ORDER — POTASSIUM CHLORIDE CRYS ER 20 MEQ PO TBCR: 40.0000 meq | EXTENDED_RELEASE_TABLET | Freq: Once | ORAL | Status: DC

## 2018-05-30 MED ORDER — SEVELAMER CARBONATE 800 MG PO TABS: 1600.0000 mg | ORAL_TABLET | Freq: Three times a day (TID) | ORAL | 2 refills | Status: DC

## 2018-05-30 MED ORDER — DARBEPOETIN ALFA 40 MCG/0.4ML IJ SOSY: 40.0000 ug | PREFILLED_SYRINGE | INTRAMUSCULAR | Status: DC

## 2018-05-30 MED ORDER — POTASSIUM CHLORIDE 20 MEQ PO PACK: 40.0000 meq | PACK | Freq: Once | ORAL | Status: DC

## 2018-05-30 MED ORDER — PRO-STAT SUGAR FREE PO LIQD
30.0000 mL | Freq: Two times a day (BID) | ORAL | Status: DC
  Administered 2018-05-30: 30 mL via ORAL

## 2018-05-30 NOTE — Progress Notes (Signed)
Removed pt fistula dressing from dialysis. Presence of thrill and bruit noted. Pt is resting with safety sitter at bed side.

## 2018-05-30 NOTE — Progress Notes (Signed)
Patient will DC to: [facility] Anticipated DC date: 05/30/18 Family notified: Brother & left vm for daughter Transport by: PTAR 2:30pm (may be behind)   Per MD patient ready for DC to [facility]. RN, patient, patient's family, and facility notified of DC. Discharge Summary sent to facility. RN given number for report [phone] Room 109). DC packet on chart. Ambulance transport requested for patient.   CSW signing off.  Cedric Fishman, LCSW Clinical Social Worker [phone]

## 2018-05-30 NOTE — Discharge Summary (Signed)
[name], is a 77 y.o. female  DOB 05/25/1941  MRN [id number].  Admission date:  05/27/2018  Admitting Physician  Bonnell Public, MD  Discharge Date:  05/30/2018   Primary MD  Hendricks Limes, MD  Recommendations for primary care physician for things to follow:   1)Please make sure her Clonidine patch is placed every Wednesday 2)Aranesp every Saturday with Hemodialysis  Admission Diagnosis  Altered mental status [R41.82]   Discharge Diagnosis  Altered mental status [R41.82]    Principal Problem:   ESRD needing dialysis ([place]) Active Problems:   Multiple myeloma ([place])   Hypertension associatd with end stage renal disease on dialysis   Anemia in neoplastic disease   Hypertensive urgency   Encephalopathy acute   Anxiety and depression   Hypertension, accelerated   Altered mental status      Past Medical History:  Diagnosis Date  . Anxiety   . Closed fracture of right distal femur ([place]) 09/01/2015  . Depression   . ESRD (end stage renal disease) on dialysis [facility])    "TTS; [facility]" (04/24/2017)  . GERD (gastroesophageal reflux disease) 04/23/2015  . HCAP (healthcare-associated pneumonia) 04/24/2017  . Heart murmur   . History of blood transfusion    "low HgB when I was going to dialysis center"  . Hypertension   . Hypertension associatd with end stage renal disease on dialysis 03/11/2014  . Malnutrition of moderate degree 09/02/2015  . Multiple myeloma ([place]) 03/11/2014  . Patient is Jehovah's Witness    "I'd rather never have any blood transfusions unless absolutely necessary; please check with me 1st". (04/24/2017)  . Pneumonia ~ 2016    Past Surgical History:  Procedure Laterality Date  . AV FISTULA PLACEMENT Left   . I&D EXTREMITY Left 02/27/2016   Procedure: ARTHROSCOPIC IRRIGATION AND DEBRIDEMENT EXTREMITY;  Surgeon: Renette Butters, MD;  Location: [facility];  Service:  Orthopedics;  Laterality: Left;  . TEE WITHOUT CARDIOVERSION N/A 02/29/2016   Procedure: TRANSESOPHAGEAL ECHOCARDIOGRAM (TEE);  Surgeon: Thayer Headings, MD;  Location[facility] ENDOSCOPY;  Service: Cardiovascular;  Laterality: N/A;      HPI  from the history and physical done on the day of admission:    Patient coming from: SNF   Chief Complaint: Uncontrolled hypertension, refused HD, garbled speech   HPI: [name] is a 77 y.o. female with medical history significant for end-stage renal disease on hemodialysis, depression with anxiety, multiple myeloma on chemotherapy, and hypertension, presenting to the emergency department from her SNF with severe hypertension and garbled speech after refusing dialysis and medications today.  She was seen in the emergency department yesterday with severe hypertension after refusing her medications, agreed to take her usual antihypertensives in the ED and her condition improved.  She then went on to refuse dialysis this morning, and refuse her medications again, and again becoming severely hypertensive and has developed garbled speech.  She had a very similar presentation last month and her condition normalized with hemodialysis and blood pressure control.  TSH  was normal at that time and she also had MRI brain that was negative for acute findings.  Patient reported a headache earlier, but denies that now.  She denies chest pain.  ED Course: Upon arrival to the ED, patient is found to be afebrile, saturating well on room air, and hypertensive to 204/115.  He EKG features a sinus or ectopic atrial rhythm with LVH.  Noncontrast head CT is negative for acute intracranial abnormality.  Chest x-ray features unchanged cardiomegaly without focal airspace disease.  Chemistry panel is notable for BUN of 44, normal potassium, and normal bicarbonate.  CBC features a stable microcytic anemia with hemoglobin of 10.5 and a chronic thrombocytopenia with platelets 147,000.  Troponin  is normal.  Patient was given Norvasc, clonidine, and losartan in the emergency department but blood pressure remains significantly elevated and nephrology was consulted by the ED physician.  Nephrologist plans for inpatient dialysis and requests a medical admission.    Hospital Course:     Brief Narrative:  [name] a 77 y.o.femalewith past medical history significant for end-stage renal disease on hemodialysis, depression with anxiety, multiple myeloma on chemotherapy, and hypertension.  Patient presented from the skilled nursing facility to the hospital with altered mentation, severe hypertension and garbled speech after refusing dialysis and medications the day of admission.  She was seen in the emergency department a day prior to admission with severe hypertension after refusing her medications, agreed to take her usual antihypertensives in the ED and her condition improved. She then went on to refuse dialysis on the morning of admission, and refused her medications again, and again becoming severely hypertensive and has developed garbled speech. She had a very similar presentation last month and her condition normalized with hemodialysis and blood pressure control.   Work-up done revealed bandemia.  We will panculture patient.  We will go ahead and start patient on IV antibiotics.  Will have a low threshold to consult oncology team (patient is on treatment for multiple myeloma).   Assessment & Plan:    Acute encephalopathy:---  -Speech pathologist eval appreciated, patient speech has improved -CT head without acute findings,-She had very similar presentation last month, had normal TSH and non-acute MRI brain, and she returned to baseline with HD and BP-control -05/28/2018: Bandemia is noted.  STOP  antibiotics as cultures remain negative ,   ESRD; hypertensive urgency -Blood pressure was previously over 458 systolic,  improved control now, discharged on clonidine patch,  Norvasc and losartan.  BP much improved after hemodialysis, patient is more compliant with antihypertensives at this time--Nephrology consulting and much appreciated, had HD on 05/29/2018   Multiple myeloma: -Follows with heme/onc at [facility] and managed with carfilzomib infusions -Counts appear stable 05/20/2018: CBC reveals bandemia, cultures remain negative, no fevers, antibiotics discontinued , f/u with  hematology oncology team as outpatient as previously scheduled.  Depression with anxiety -Improved behavioral disturbance, patient is more cooperative, compliant at this time continue Zoloft, Lamictal, prn Ativan  Disposition---  discharge back to facility on 05/30/2018 due to improvement in mental/neuro status, improved blood pressure control negative  cultures  VT prophylaxis:sq heparin Code Status:Full Family Communication: Patient's brother.   Consults called:Nephrology.  Antimicrobials:    IV vancomycin and cefepime after samples are taken for culture.  Discharge Condition: stable  Follow UP-nephrologist and hematology/advised  Diet and Activity recommendation:  As advised  Discharge Instructions    Discharge Instructions    Call MD for:  difficulty breathing, headache or visual disturbances   Complete  by:  As directed    Call MD for:  persistant dizziness or light-headedness   Complete by:  As directed    Call MD for:  persistant nausea and vomiting   Complete by:  As directed    Call MD for:  severe uncontrolled pain   Complete by:  As directed    Call MD for:  temperature >100.4   Complete by:  As directed    Diet - low sodium heart healthy   Complete by:  As directed    Discharge instructions   Complete by:  As directed    1)Please make sure her Clonidine patch is placed every Wednesday 2)Aranesp every Saturday with Hemodialysis   Walk with assistance   Complete by:  As directed    Fall Precautions        Discharge Medications       Allergies as of 05/30/2018      Reactions   Phenergan [promethazine Hcl] Other (See Comments)   Acute encephalopathy in the context of refusal to go to hemodialysis and administration of Phenergan for nausea and vomiting      Medication List    TAKE these medications   amLODipine 10 MG tablet Commonly known as:  NORVASC Take 10 mg by mouth daily. Hold for SBP <100MM/HG   BISAC-EVAC 10 MG suppository Generic drug:  bisacodyl Place 10 mg rectally once as needed for moderate constipation (FOR CONSTIPATION NOT RELIEVED BY MILK OF MAGNESIA).   calcium acetate 667 MG capsule Commonly known as:  PHOSLO Take 2,001 mg by mouth 3 (three) times daily.   cloNIDine 0.1 MG tablet Commonly known as:  CATAPRES Take 0.1 mg by mouth every 8 (eight) hours as needed (for a systolic B/P of 937 or greater).   cloNIDine 0.2 mg/24hr patch Commonly known as:  CATAPRES - Dosed in mg/24 hr Place 1 patch (0.2 mg total) onto the skin once a week. Start taking on:  06/04/2018   hydrocortisone cream 1 % Apply 1 application topically every 8 (eight) hours as needed for itching. Apply to rectum for hemorrhoids   isosorbide mononitrate 60 MG 24 hr tablet Commonly known as:  IMDUR Take 60 mg by mouth daily.   lamoTRIgine 25 MG tablet Commonly known as:  LAMICTAL Take 50 mg by mouth daily.   latanoprost 0.005 % ophthalmic solution Commonly known as:  XALATAN Place 1 drop into both eyes at bedtime.   loperamide 2 MG tablet Commonly known as:  IMODIUM A-D Take 4 mg by mouth as needed for diarrhea or loose stools. The patient requests this for dialysis days.   LORazepam 0.5 MG tablet Commonly known as:  ATIVAN Take 1 tablet (0.5 mg total) by mouth at bedtime.   losartan 50 MG tablet Commonly known as:  COZAAR Take 50 mg by mouth at bedtime.   multivitamin Tabs tablet Take 1 tablet by mouth daily.   ondansetron 4 MG tablet Commonly known as:  ZOFRAN Take 4 mg by mouth every 8 (eight) hours as  needed for nausea or vomiting.   ranitidine 300 MG tablet Commonly known as:  ZANTAC Take 300 mg by mouth at bedtime. Reported on 03/05/2016   saccharomyces boulardii 250 MG capsule Commonly known as:  FLORASTOR Take 250 mg by mouth 2 (two) times daily as needed.   SENEXON-S 8.6-50 MG tablet Generic drug:  senna-docusate Take 2 tablets by mouth at bedtime as needed.   sertraline 50 MG tablet Commonly known as:  ZOLOFT Take 50  mg by mouth daily.   sevelamer carbonate 800 MG tablet Commonly known as:  RENVELA Take 2 tablets (1,600 mg total) by mouth 3 (three) times daily with meals. What changed:  how much to take   simethicone 125 MG chewable tablet Commonly known as:  MYLICON Chew 875 mg by mouth 3 (three) times daily. Take 1 tablet by mouth prior to each meal at 8AM, 11AM, and 6PM   valACYclovir 500 MG tablet Commonly known as:  VALTREX Take 1 tablet (500 mg total) by mouth every other day.       Major procedures and Radiology Reports - PLEASE review detailed and final reports for all details, in brief -   Dg Chest 2 View  Result Date: 05/27/2018 CLINICAL DATA:  Altered mental status EXAM: CHEST - 2 VIEW COMPARISON:  04/16/2018 FINDINGS: The lungs are well inflated. The cardiomediastinal silhouette is markedly enlarged. There is no focal airspace consolidation or pulmonary edema. There is no pleural effusion or pneumothorax. IMPRESSION: Unchanged cardiomegaly without focal airspace disease. Electronically Signed   By: Ulyses Jarred M.D.   On: 05/27/2018 17:59   Ct Head Wo Contrast  Result Date: 05/27/2018 CLINICAL DATA:  Occipital headache since yesterday, altered mental status today. EXAM: CT HEAD WITHOUT CONTRAST TECHNIQUE: Contiguous axial images were obtained from the base of the skull through the vertex without intravenous contrast. COMPARISON:  MRI of the head April 16, 2018 FINDINGS: BRAIN: No intraparenchymal hemorrhage, mass effect nor midline shift. Moderate  parenchymal brain volume loss. No hydrocephalus. No hydrocephalus. Patchy supratentorial white matter hypodensities within normal range for patient's age, though non-specific are most compatible with chronic small vessel ischemic disease. Old LEFT cerebellar infarct. Old LEFT thalamus and LEFT basal ganglia lacunar infarcts. No acute large vascular territory infarcts. No abnormal extra-axial fluid collections. Basal cisterns are patent. VASCULAR: Moderate calcific atherosclerosis of the carotid siphons, mild calcific atherosclerosis LEFT MCA. Dolichoectatic intracranial vessels associated with chronic hypertension. SKULL: No skull fracture. Mild lead thickened and dense calvarium suggesting renal osteodystrophy osteopenia. No significant scalp soft tissue swelling. SINUSES/ORBITS: Minimal paranasal sinus mucosal thickening. Mastoid air cells are well aerated. Soft tissue within the external auditory canals most compatible with cerumen. The included ocular globes and orbital contents are non-suspicious. OTHER: None. IMPRESSION: 1. No acute intracranial process. 2. Stable examination including mild-to-moderate chronic small vessel ischemic changes and old lacunar infarcts. Electronically Signed   By: Elon Alas M.D.   On: 05/27/2018 17:43    Micro Results    Recent Results (from the past 240 hour(s))  MRSA PCR Screening     Status: None   Collection Time: 05/27/18  9:20 PM  Result Value Ref Range Status   MRSA by PCR NEGATIVE NEGATIVE Final    Comment:        The GeneXpert MRSA Assay (FDA approved for NASAL specimens only), is one component of a comprehensive MRSA colonization surveillance program. It is not intended to diagnose MRSA infection nor to guide or monitor treatment for MRSA infections. Performed at [facility], [place] [address], [place], [place] [address]   Culture, blood (routine x 2)     Status: None (Preliminary result)   Collection Time: 05/28/18  8:20 PM  Result  Value Ref Range Status   Specimen Description BLOOD RIGHT ANTECUBITAL  Final   Special Requests   Final    BOTTLES DRAWN AEROBIC AND ANAEROBIC Blood Culture adequate volume   Culture   Final    NO GROWTH <  12 HOURS Performed at [facility], [place] [address], [place], [place] [address]    Report Status PENDING  Incomplete  Culture, blood (routine x 2)     Status: None (Preliminary result)   Collection Time: 05/28/18  8:28 PM  Result Value Ref Range Status   Specimen Description BLOOD RIGHT HAND  Final   Special Requests   Final    BOTTLES DRAWN AEROBIC ONLY Blood Culture adequate volume   Culture   Final    NO GROWTH < 12 HOURS Performed at [facility], [place] [address], [place], [place] [address]    Report Status PENDING  Incomplete       Today   Subjective    [name] today has no complaints, eating and drinking better, more cooperative, no fevers no vomiting no diarrhea          Patient has been seen and examined prior to discharge   Objective   Blood pressure (!) 146/91, pulse 62, temperature 98.7 F (37.1 C), temperature source Axillary, resp. rate 19, height _0  (1.702 m), weight 57.4 kg, SpO2 99 %.   Intake/Output Summary (Last 24 hours) at 05/30/2018 1104 Last data filed at 05/30/2018 0900 Gross per 24 hour  Intake 480 ml  Output 2500 ml  Net -2020 ml    Exam Gen:- Awake Alert,  In no apparent distress , cooperative HEENT:- [place].AT, No sclera icterus Neck-Supple Neck,No JVD,.  Lungs-  CTAB , no wheezing CV- S1, S2 normal Abd-  +ve B.Sounds, Abd Soft, No tenderness,    Extremity/Skin:-Good pulses ,  Left upper extremity AV fistula with thrill and bruit psych-affect is appropriate, oriented x3 Neuro-no new focal deficits, no tremors   Data Review   CBC w Diff:  Lab Results  Component Value Date   WBC 4.6 05/29/2018   HGB 10.2 (L) 05/29/2018   HGB 11.5 (L) 03/04/2015   HCT 33.0 (L) 05/29/2018   HCT 34.1 05/28/2018   HCT 35.9  03/04/2015   PLT 156 05/29/2018   PLT 158 03/04/2015   LYMPHOPCT 10 05/29/2018   LYMPHOPCT 19.5 03/04/2015   BANDSPCT 0 02/20/2016   MONOPCT 7 05/29/2018   MONOPCT 12.9 03/04/2015   EOSPCT 1 05/29/2018   EOSPCT 0.5 03/04/2015   BASOPCT 0 05/29/2018   BASOPCT 0.7 03/04/2015    CMP:  Lab Results  Component Value Date   NA 136 05/29/2018   NA 135 (A) 05/01/2018   NA 135 (L) 02/25/2015   K 3.4 (L) 05/29/2018   K 4.2 02/25/2015   CL 99 05/29/2018   CO2 27 05/29/2018   CO2 29 02/25/2015   BUN 19 05/29/2018   BUN 37 (A) 05/01/2018   BUN 22.3 02/25/2015   CREATININE 5.95 (H) 05/29/2018   CREATININE 5.6 (HH) 02/25/2015   GLU 85 05/01/2018   PROT 7.5 05/27/2018   PROT 8.6 (H) 02/25/2015   ALBUMIN 3.2 (L) 05/29/2018   ALBUMIN 3.1 (L) 02/25/2015   BILITOT 0.9 05/27/2018   BILITOT 0.52 02/25/2015   ALKPHOS 53 05/27/2018   ALKPHOS 55 02/25/2015   AST 22 05/27/2018   AST 16 02/25/2015   ALT 11 05/27/2018   ALT 10 02/25/2015    Total Discharge time is about 33 minutes  Una Yeomans M.D on 05/30/2018 at 11:04 AM  Pager---[phone]  Go to [email or web address] - password [email or web address] for contact info  [facility] - Office  [phone]

## 2018-05-30 NOTE — Progress Notes (Signed)
[facility] Progress Note   Subjective:  Seen in room. No new complaints. No CP, dyspnea. Just polished off eating full breakfast.   Objective Vitals:   05/29/18 2008 05/29/18 2022 05/30/18 0542 05/30/18 0853  BP: (!) 139/91  (!) 168/67 (!) 146/91  Pulse: 91 72 (!) 55 62  Resp: '20 16 18 19  ' Temp: 99.4 F (37.4 C)  99.5 F (37.5 C) 98.7 F (37.1 C)  TempSrc: Oral  Oral Axillary  SpO2: 100% 98% 95% 99%  Weight:      Height:       Physical Exam General: Elderly female, NAD Heart: RRR; no murmur Lungs: CTAB Abdomen: soft, non-tender Extremities: No LE edema Dialysis Access: Aneurysmal LUE AVF + thrill  Additional Objective Labs: Basic Metabolic Panel: Recent Labs  Lab 05/27/18 1715 05/27/18 1723 05/28/18 0024 05/29/18 0526  NA 138 137 137 136  K 4.0 4.0 3.7 3.4*  CL 97* 97* 97* 99  CO2 29  --  29 27  GLUCOSE 101* 98 110* 74  BUN 44* 47* 46* 19  CREATININE 8.68* 9.30* 8.88* 5.95*  CALCIUM 10.0  --  10.0 9.2  PHOS  --   --   --  2.9   Liver Function Tests: Recent Labs  Lab 05/27/18 1715 05/29/18 0526  AST 22  --   ALT 11  --   ALKPHOS 53  --   BILITOT 0.9  --   PROT 7.5  --   ALBUMIN 3.4* 3.2*   CBC: Recent Labs  Lab 05/27/18 1715 05/27/18 1723 05/28/18 0024 05/29/18 0526  WBC 5.1  --   --  4.6  NEUTROABS 4.3  --   --  3.7  HGB 10.5* 12.2  --  10.2*  HCT 35.0* 36.0 34.1 33.0*  MCV 113.3*  --   --  110.0*  PLT 147*  --   --  156   CBG: Recent Labs  Lab 05/29/18 2005 05/30/18 0020 05/30/18 0409 05/30/18 0755 05/30/18 0821  GLUCAP 111* 76 77 69* 109*   Medications: . sodium chloride Stopped (05/29/18 0629)  . famotidine (PEPCID) IV 20 mg (05/30/18 0235)  . vancomycin Stopped (05/29/18 1110)   . amLODipine  10 mg Oral Daily  . calcium acetate  2,001 mg Oral TID WC  . ceFEPime (MAXIPIME) IV  500 mg Intravenous Q24H  . cloNIDine  0.2 mg Transdermal Weekly  . heparin  5,000 Units Subcutaneous Q8H  . isosorbide  mononitrate  60 mg Oral Daily  . lamoTRIgine  50 mg Oral Daily  . latanoprost  1 drop Both Eyes QHS  . LORazepam  0.5 mg Oral QHS  . losartan  50 mg Oral QHS  . multivitamin  1 tablet Oral Daily  . sertraline  50 mg Oral Daily  . sevelamer carbonate  800 mg Oral TID WC  . sodium chloride flush  3 mL Intravenous Q12H  . sodium chloride flush  3 mL Intravenous Q12H  . valACYclovir  500 mg Oral Q48H    Dialysis Orders: TTS at [facility] 3:30hr, 2K/2.25Ca, AVF, no heparin, EDW 63kg (under at times) - Epo 19K TIW  Assessment/Plan: 1. AMS/Encephalopathy: Head CT and CXR negative. BCx negative, remains on empiric abx. Improving overall. 2. ESRD: Continue HD per TTS schedule, next 8/31. 3. HTN/volume: BP variable. No edema. Pt refusing meds at time. No changes for now. 4. Anemia: Hgb 10.2 today, will start Aranesp 7mg weekly. 5. Secondary hyperparathyroidism: Corr Ca borderline high. Phos low -> on  2 binders here, will d/c phoslo 6. Nutrition: Alb low, will add pro-stat supps. 7. Multiple Myeloma: Per primary. 8. Depression: Per primary.  Veneta Penton, PA-C 05/30/2018, 9:50 AM  [employer] Pager: [phone]

## 2018-05-30 NOTE — Clinical Social Work Note (Signed)
Clinical Social Work Assessment  Patient Details  Name: [name] MRN: [id number] Date of Birth: 13-Apr-1941  Date of referral:  05/30/18               Reason for consult:  Facility Placement                Permission sought to share information with:  Facility Sport and exercise psychologist, Family Supports Permission granted to share information::  Yes, Verbal Permission Granted  Name::     [name]  Agency::  [facility]  Relationship::  Daughter/Brother  Contact Information:     [phone]   Housing/Transportation Living arrangements for the past 2 months:  [place] of Information:  Siblings Patient Interpreter Needed:  None Criminal Activity/Legal Involvement Pertinent to Current Situation/Hospitalization:  No - Comment as needed Significant Relationships:  Adult Children, Siblings Lives with:  Facility Resident Do you feel safe going back to the place where you live?  Yes Need for family participation in patient care:  Yes (Comment)  Care giving concerns:  CSW received consult regarding discharge planning. Patient is a long term care resident at [name] and will return there at discharge according to patient's brother. CSW also left voicemail for patient's daughter. CSW to continue to follow and assist with discharge planning needs.   Social Worker assessment / plan:  CSW spoke with patient's brother concerning possibility of rehab at [name] before returning home.  Employment status:  Retired Forensic scientist:  Medicare PT Recommendations:  [name] / Referral to community resources:  [place]  Patient/Family's Response to care: Patient reports agreement with discharge back to [name] today, though patient often speaks tangentially. Patient's brother stated that he wants patient to walk at the facility.   Patient/Family's Understanding of and Emotional Response to Diagnosis, Current Treatment, and  Prognosis:  Patient/family is realistic regarding therapy needs and expressed being hopeful for return to SNF placement. Patient and brother expressed understanding of CSW role and discharge process as well as medical condition. No questions/concerns about plan or treatment.    Emotional Assessment Appearance:  Appears stated age Attitude/Demeanor/Rapport:  Unable to Assess Affect (typically observed):  Unable to Assess Orientation:  Oriented to Self, Oriented to Place, Oriented to Situation Alcohol / Substance use:  Not Applicable Psych involvement (Current and /or in the community):  No (Comment)  Discharge Needs  Concerns to be addressed:  Care Coordination Readmission within the last 30 days:  No Current discharge risk:  None Barriers to Discharge:  No Barriers Identified   Benard Halsted, [place] 05/30/2018, 12:14 PM

## 2018-05-30 NOTE — Discharge Instructions (Signed)
1)Please make sure her Clonidine patch is placed every Wednesday 2)Aranesp every Saturday with Hemodialysis

## 2018-05-30 NOTE — Progress Notes (Signed)
Pt given discharge instructions, prescriptions, and care notes added to chart and sent to facility.   IV was discontinued, no redness, pain, or swelling noted at this time. Telemetry discontinued and Centralized Telemetry was notified. Pt left the floor via stretcher in stable condition.

## 2018-05-30 NOTE — Progress Notes (Signed)
[employer]  Report given to nurse.

## 2018-05-30 NOTE — Progress Notes (Signed)
Physical Therapy Treatment Patient Details Name: [name] MRN: [id number] DOB: [birth year] Today's Date: 05/30/2018    History of Present Illness Patient is a 77 y/o female admitted with AMS from [facility] due to being non compliant with meds and skipping dialysis. Found to have acute encephalopathy. PMH includes ESRD on HD, HTN, depression, anxiety.     PT Comments    Patient progressing well towards PT goals. Tolerated gait training today with Min A for balance/safety and heavy reliance on UEs for support. Fatigues quickly requiring seated and standing rest breaks. Continues to demonstrate some confusion and cognitive deficits- not sure what baseline is. Eager to return to [place] and start therapy there. Will follow.    Follow Up Recommendations  SNF;Supervision for mobility/OOB     Equipment Recommendations  None recommended by PT    Recommendations for Other Services       Precautions / Restrictions Precautions Precautions: Fall Restrictions Weight Bearing Restrictions: No    Mobility  Bed Mobility Overal bed mobility: Needs Assistance Bed Mobility: Supine to Sit     Supine to sit: Supervision;HOB elevated     General bed mobility comments: no assist needed, increased time.  Transfers Overall transfer level: Needs assistance Equipment used: Rolling walker (2 wheeled) Transfers: Sit to/from Stand Sit to Stand: Min assist         General transfer comment: Min A to power to standing progressing to Min guard assist with LOB onto chair x1. CUes for upright.   Ambulation/Gait Ambulation/Gait assistance: Min assist Gait Distance (Feet): 75 Feet(x2 bouts) Assistive device: Rolling walker (2 wheeled) Gait Pattern/deviations: Trunk flexed;Step-through pattern;Decreased step length - right;Decreased step length - left;Narrow base of support Gait velocity: decreased   General Gait Details: Slow, unsteady gait with flexed posture; cues for upright. 2 standing  rest breaks, 1 seated rest break. Knees become more flexed with increased distance.    Stairs             Wheelchair Mobility    Modified Rankin (Stroke Patients Only)       Balance Overall balance assessment: Needs assistance;History of Falls Sitting-balance support: Feet supported;No upper extremity supported Sitting balance-Leahy Scale: Good     Standing balance support: During functional activity;Bilateral upper extremity supported Standing balance-Leahy Scale: Poor Standing balance comment: Cues for upright and heavy reliance on BUEs for support.                            Cognition Arousal/Alertness: Awake/alert Behavior During Therapy: WFL for tasks assessed/performed Overall Cognitive Status: Impaired/Different from baseline Area of Impairment: Orientation;Memory;Following commands;Safety/judgement;Problem solving;Awareness                 Orientation Level: Disoriented to;Situation Current Attention Level: Selective Memory: Decreased short-term memory Following Commands: Follows one step commands with increased time Safety/Judgement: Decreased awareness of deficits Awareness: Intellectual Problem Solving: Requires verbal cues        Exercises      General Comments        Pertinent Vitals/Pain Pain Assessment: No/denies pain    Home Living                      Prior Function            PT Goals (current goals can now be found in the care plan section) Progress towards PT goals: Progressing toward goals    Frequency    Min 2X/week  PT Plan Current plan remains appropriate    Co-evaluation              AM-PAC PT "6 Clicks" Daily Activity  Outcome Measure  Difficulty turning over in bed (including adjusting bedclothes, sheets and blankets)?: None Difficulty moving from lying on back to sitting on the side of the bed? : None Difficulty sitting down on and standing up from a chair with arms (e.g.,  wheelchair, bedside commode, etc,.)?: A Little Help needed moving to and from a bed to chair (including a wheelchair)?: A Little Help needed walking in hospital room?: A Little Help needed climbing 3-5 steps with a railing? : A Lot 6 Click Score: 19    End of Session Equipment Utilized During Treatment: Gait belt Activity Tolerance: Patient tolerated treatment well Patient left: in chair;with call bell/phone within reach Nurse Communication: Mobility status PT Visit Diagnosis: Muscle weakness (generalized) (M62.81);Unsteadiness on feet (R26.81);Difficulty in walking, not elsewhere classified (R26.2)     Time: 1351-1415 PT Time Calculation (min) (ACUTE ONLY): 24 min  Charges:  $Gait Training: 8-22 mins $Therapeutic Activity: 8-22 mins                     Wray Kearns, [place], DPT Acute Rehabilitation Services Pager [phone] Office [place] 05/30/2018, 2:30 PM

## 2018-05-30 NOTE — Care Management Note (Signed)
Case Management Note  Patient Details  Name: [name] MRN: [id number] Date of Birth: May 06, 1941  Subjective/Objective:                    Action/Plan:  DC to SNF as facilitated by CSW.   Expected Discharge Date:  05/30/18               Expected Discharge Plan:  Skilled Nursing Facility  In-House Referral:  Clinical Social Work  Discharge planning Services     Post Acute Care Choice:    Choice offered to:     DME Arranged:    DME Agency:     HH Arranged:    [place] Agency:     Status of Service:  Completed, signed off  If discussed at [employer], dates discussed:    Additional Comments:  Carles Collet, RN 05/30/2018, 11:39 AM

## 2018-06-02 LAB — CULTURE, BLOOD (ROUTINE X 2)
Culture: NO GROWTH
Culture: NO GROWTH
Special Requests: ADEQUATE
Special Requests: ADEQUATE

## 2018-06-02 NOTE — Care Management Important Message (Signed)
Important Message  Patient Details  Name: [name] MRN: [id number] Date of Birth: Oct 06, 1940   Medicare Important Message Given:  Yes Pt did not signed she stated that she is too ill to signed so un signed copy left with the patient   Orbie Pyo 06/02/2018, 8:08 AM

## 2018-06-03 ENCOUNTER — Non-Acute Institutional Stay (SKILLED_NURSING_FACILITY): Payer: Medicare Other | Admitting: Internal Medicine

## 2018-06-03 ENCOUNTER — Encounter: Payer: Self-pay | Admitting: Internal Medicine

## 2018-06-03 DIAGNOSIS — I10 Essential (primary) hypertension: Secondary | ICD-10-CM | POA: Diagnosis not present

## 2018-06-03 DIAGNOSIS — R410 Disorientation, unspecified: Secondary | ICD-10-CM

## 2018-06-03 DIAGNOSIS — N186 End stage renal disease: Secondary | ICD-10-CM | POA: Diagnosis not present

## 2018-06-03 DIAGNOSIS — Z992 Dependence on renal dialysis: Secondary | ICD-10-CM | POA: Diagnosis not present

## 2018-06-03 NOTE — Progress Notes (Signed)
NURSING HOME LOCATION:  [facility] ROOM NUMBER:  109-A  CODE STATUS:  DNR  PCP:  Hendricks Limes, MD  [place] [id number]  This is [place] readmission within 30 days.    Interim medical record and care since last [place] visit was updated with review of diagnostic studies and change in clinical status since last visit were documented.  HPI: Rehospitalized 8/27-8/30/2019 with altered mental status as acute encephalopathy in the context of accelerated hypertension. The patient had been seen in the ED 8/26 for accelerated hypertension after refusing her blood pressure medicines.  In the ED she did take the medicines and blood pressure improved.   The morning of admission 8/27 she refused dialysis and became progressively more encephalopathic with garbled speech and belligerent behavior.  In July the patient had a similar presentation which normalized with hemodialysis and blood pressure control.  During that admission TSH was normal and MRI revealed no acute process. On 8/27 blood pressure was found to be 204/115 LVH present on EKG.  Contrast CT head was negative for acute intracranial changes. CXR x-ray reveals cardiomegaly.  CBC revealed a stable microcytic anemia with hemoglobin 10.5. In the EDshe received Lasix, clonidine, and losartan with minimal response of the hypertension. Nephrology consulted and inpatient dialysis scheduled. She received empiric antibiotics but these were stopped as she clinically improved with no sign of active infection.   Blood pressure responded to hemodialysis 8/29. The patient is being treated at [facility] with infusions for multiple myeloma. Zoloft, Lamictal, and as needed Ativan were continued for depression & anxiety with behavioral disturbance.  Review of systems: Patient denies active symptoms except for occasional nonproductive cough.  She fixates on not being able "to make #1; I can make #2".  She did not  comprehend why she was anuric.  She seems to think hemodialysis is to "take off fluid" in reference to peripheral edema.  She does not understand that the anuria reflects the end-stage renal disease.  She is unsure when she became anuric.  She insists she needs to drink more fluids as recommended by her brother to improve urine flow.  She is on fluid restriction but nursing reports that she does not follow these restrictions.  Constitutional: No fever, significant weight change, fatigue  Eyes: No redness, discharge, pain, vision change ENT/mouth: No nasal congestion,  purulent discharge, earache, change in hearing, sore throat  Cardiovascular: No chest pain, palpitations, paroxysmal nocturnal dyspnea, claudication, edema  Respiratory: No sputum production, hemoptysis, DOE, significant snoring, apnea   Gastrointestinal: No heartburn, dysphagia, abdominal pain, nausea /vomiting, rectal bleeding, melena, change in bowels Genitourinary: Anuric (No dysuria, hematuria, pyuria, incontinence, nocturia) Musculoskeletal: No joint stiffness, joint swelling, weakness, pain Dermatologic: No rash, pruritus, change in appearance of skin Neurologic: No dizziness, headache, syncope, seizures, numbness, tingling Endocrine: No change in hair/skin/nails, excessive thirst, excessive hunger, excessive urination  Hematologic/lymphatic: No significant bruising, lymphadenopathy, abnormal bleeding Allergy/immunology: No itchy/watery eyes, significant sneezing, urticaria, angioedema  Physical exam:  Pertinent or positive findings: She is somewhat argumentative especially when I recommended she should follow the recommendations of the Nephrologist rather than her brother as to fluid intake.  Arcus senilis is present.  She is wearing only her upper plate.  There is a grade 1.5 systolic murmur at the right base.  Second heart sound is increased.  Pedal pulses are palpable.  The toenails are deformed.  Varicose veins are noted in  the left lower extremity.  The  right lower extremity is slightly weaker than the other extremities.  She has fusiform changes of the right knee.  Subtle asymmetry of the posterior thoracic musculature is suggested.  General appearance: Adequately nourished; no acute distress, increased work of breathing is present.   Lymphatic: No lymphadenopathy about the head, neck, axilla. Eyes: No conjunctival inflammation or lid edema is present. There is no scleral icterus. Ears:  External ear exam shows no significant lesions or deformities.   Nose:  External nasal examination shows no deformity or inflammation. Nasal mucosa are pink and moist without lesions, exudates Oral exam:  Lips and gums are healthy appearing. There is no oropharyngeal erythema or exudate. Neck:  No thyromegaly, masses, tenderness noted.    Heart:  Normal rate and regular rhythm. S1  normal without gallop,  click, rub .  Lungs:  without wheezes, rhonchi, rales, rubs. Abdomen: Bowel sounds are normal. Abdomen is soft and nontender with no organomegaly, hernias, masses. GU: Deferred  Extremities:  No cyanosis, clubbing, edema  Skin: Warm & dry w/o tenting. No significant lesions or rash.  See summary under each active problem in the Problem List with associated updated therapeutic plan

## 2018-06-03 NOTE — Assessment & Plan Note (Addendum)
See 06/03/2018: patient does not comprehend while she is in anuric.  She perceives the dialysis is to control peripheral edema.  Nephrology will be asked to re-educate the patient as to the need for dialysis on a scheduled maintenance program Repeat MS testing to R/O progressive neurocognitive deficit

## 2018-06-03 NOTE — Assessment & Plan Note (Signed)
Repeat mental status testing as there may be a component of vascular dementia in addition to mood disorder Psych NP to continue to follow

## 2018-06-03 NOTE — Patient Instructions (Signed)
See assessment and plan under each diagnosis in the problem list and acutely for this visit 

## 2018-06-03 NOTE — Assessment & Plan Note (Signed)
Pressure now controlled; continue TTS patch weekly and titrate dose as clinically indicated

## 2018-06-04 ENCOUNTER — Encounter: Payer: Self-pay | Admitting: Internal Medicine

## 2018-06-05 ENCOUNTER — Encounter: Payer: Self-pay | Admitting: Internal Medicine

## 2018-06-05 DIAGNOSIS — R4189 Other symptoms and signs involving cognitive functions and awareness: Secondary | ICD-10-CM

## 2018-06-05 DIAGNOSIS — R29818 Other symptoms and signs involving the nervous system: Secondary | ICD-10-CM | POA: Insufficient documentation

## 2018-06-16 ENCOUNTER — Non-Acute Institutional Stay (SKILLED_NURSING_FACILITY): Payer: Medicare Other | Admitting: Internal Medicine

## 2018-06-16 ENCOUNTER — Encounter: Payer: Self-pay | Admitting: Internal Medicine

## 2018-06-16 DIAGNOSIS — R195 Other fecal abnormalities: Secondary | ICD-10-CM

## 2018-06-16 DIAGNOSIS — C9000 Multiple myeloma not having achieved remission: Secondary | ICD-10-CM | POA: Diagnosis not present

## 2018-06-16 NOTE — Patient Instructions (Signed)
See assessment and plan under each diagnosis in the problem list and acutely for this visit 

## 2018-06-16 NOTE — Assessment & Plan Note (Signed)
Probiotic trial each Wednesday, Thursday, and Friday to ameliorate change in stools possibly related to chemotherapy

## 2018-06-16 NOTE — Progress Notes (Signed)
    NURSING HOME LOCATION:  [facility] ROOM NUMBER:  109-A  CODE STATUS:  Full Code  PCP:  Hendricks Limes, MD  [address] [place] [address]  This is a nursing facility follow up for specific acute issue of subjective diarrhea.  Interim medical record and care since last [place] visit was updated with review of diagnostic studies and change in clinical status since last visit were documented.  HPI: Actually the patient denies frank diarrhea.  Apparently she has loose stool for the 48 hours after she receives chemotherapy for her myeloma each Wednesday.  Sees Dr. Amalia Hailey at [facility].     Review of systems: She denies other GI symptoms.  She does have weight gain related to renal insufficiency which resolves with hemodialysis.  Even though she is on hemodialysis, she maintains that she is not anuric.  Constitutional: No fever, significant weight change, fatigue  Cardiovascular: No chest pain, palpitations, paroxysmal nocturnal dyspnea, claudication, edema @ present  Respiratory: No cough, sputum production, hemoptysis, DOE, significant snoring, apnea   Gastrointestinal: No heartburn, dysphagia, abdominal pain, nausea /vomiting, rectal bleeding, melena Genitourinary: No dysuria, hematuria, pyuria, incontinence, nocturia Musculoskeletal: No joint stiffness, joint swelling, weakness, pain Dermatologic: No rash, pruritus, change in appearance of skin Endocrine: No change in hair/skin/nails, excessive thirst, excessive hunger, excessive urination  Hematologic/lymphatic: No significant bruising, lymphadenopathy, abnormal bleeding  Physical exam:  Pertinent or positive findings: She wears only her upper plate.  She is alert and demonstrates her usual argumentative personna.  She typically answers "no" abruptly before the question is even completed.  She has a harsh mechanical type right base murmur.  Second heart sounds increased.  Breath sounds are decreased.   Pulses in the feet are palpable  General appearance: Adequately nourished; no acute distress, increased work of breathing is present.   Lymphatic: No lymphadenopathy about the head, neck, axilla. Eyes: No conjunctival inflammation or lid edema is present. There is no scleral icterus. Ears:  External ear exam shows no significant lesions or deformities.   Nose:  External nasal examination shows no deformity or inflammation. Nasal mucosa are pink and moist without lesions, exudates Oral exam:  Lips and gums are healthy appearing. Neck:  No thyromegaly, masses, tenderness noted.    Heart:  Normal rate and regular rhythm. S1 normal without gallop,  click, rub .  Lungs:  without wheezes, rhonchi, rales, rubs. Abdomen: Bowel sounds are normal. Abdomen is soft and nontender with no organomegaly, hernias, masses. GU: Deferred  Extremities:  No cyanosis, clubbing, edema  Skin: Warm & dry w/o tenting. No significant lesions or rash.  See summary under each active problem in the Problem List with associated updated therapeutic plan

## 2018-06-18 ENCOUNTER — Encounter: Payer: Self-pay | Admitting: Internal Medicine

## 2018-06-26 ENCOUNTER — Non-Acute Institutional Stay (SKILLED_NURSING_FACILITY): Payer: Medicare Other

## 2018-06-26 DIAGNOSIS — Z0000 Encounter for general adult medical examination without abnormal findings: Secondary | ICD-10-CM | POA: Diagnosis not present

## 2018-06-26 NOTE — Progress Notes (Signed)
Subjective:   [name] is a 77 y.o. female who presents for Medicare Annual (Subsequent) preventive examination at [facility]  Last AWV-06/06/2017     Objective:     Vitals: BP 125/72 (BP Location: Left Arm, Patient Position: Sitting)   Pulse 74   Temp 97.8 F (36.6 C) (Oral)   Ht _0  (1.702 m)   Wt 128 lb (58.1 kg)   BMI 20.05 kg/m   Body mass index is 20.05 kg/m.  Advanced Directives 06/26/2018 05/01/2018 06/26/2017 06/06/2017 04/24/2017 04/24/2017 03/08/2017  Does Patient Have a Medical Advance Directive? _1  No No  Type of Advance Directive - - - - - - -  Does patient want to make changes to medical advance directive? - - - - - - -  Copy of [place] in Chart? - - - - - - -  Would patient like information on creating a medical advance directive? No - Patient declined - - No - Patient declined No - Patient declined - -    Tobacco Social History   Tobacco Use  Smoking Status Never Smoker  Smokeless Tobacco Never Used     Counseling given: Not Answered   Clinical Intake:  Pre-visit preparation completed: No  Pain : No/denies pain     Diabetes: No  How often do you need to have someone help you when you read instructions, pamphlets, or other written materials from your doctor or pharmacy?: 2 - Rarely  Interpreter Needed?: No  Information entered by :: Tyson Dense, RN  Past Medical History:  Diagnosis Date  . Anxiety   . Closed fracture of right distal femur ([place]) 09/01/2015  . Depression   . ESRD (end stage renal disease) on dialysis [facility])    "TTS; [facility]" (04/24/2017)  . GERD (gastroesophageal reflux disease) 04/23/2015  . HCAP (healthcare-associated pneumonia) 04/24/2017  . Heart murmur   . History of blood transfusion    "low HgB when I was going to dialysis center"  . Hypertension   . Hypertension associated with end stage renal disease on dialysis 03/11/2014  . Malnutrition of moderate degree 09/02/2015   . Multiple myeloma ([place]) 03/11/2014  . Patient is Jehovah's Witness    "I'd rather never have any blood transfusions unless absolutely necessary; please check with me 1st". (04/24/2017)  . Pneumonia ~ 2016   Past Surgical History:  Procedure Laterality Date  . AV FISTULA PLACEMENT Left   . I&D EXTREMITY Left 02/27/2016   Procedure: ARTHROSCOPIC IRRIGATION AND DEBRIDEMENT EXTREMITY;  Surgeon: Renette Butters, MD;  Location: [facility];  Service: Orthopedics;  Laterality: Left;  . TEE WITHOUT CARDIOVERSION N/A 02/29/2016   Procedure: TRANSESOPHAGEAL ECHOCARDIOGRAM (TEE);  Surgeon: Thayer Headings, MD;  Location: [facility] ENDOSCOPY;  Service: Cardiovascular;  Laterality: N/A;   Family History  Problem Relation Age of Onset  . Hypertension Mother   . Hypertension Father    Social History   Socioeconomic History  . Marital status: Single    Spouse name: Not on file  . Number of children: 3  . Years of education: Not on file  . Highest education level: Not on file  Occupational History  . Occupation: Retired     Comment: Pharmacist, hospital asst.  Social Needs  . Financial resource strain: Not hard at all  . Food insecurity:    Worry: Never true    Inability: Never true  . Transportation needs:    Medical: No  Non-medical: No  Tobacco Use  . Smoking status: Never Smoker  . Smokeless tobacco: Never Used  Substance and Sexual Activity  . Alcohol use: No    Alcohol/week: 0.0 standard drinks  . Drug use: No  . Sexual activity: Not Currently  Lifestyle  . Physical activity:    Days per week: 3 days    Minutes per session: 30 min  . Stress: Not at all  Relationships  . Social connections:    Talks on phone: Twice a week    Gets together: Never    Attends religious service: Never    Active member of club or organization: No    Attends meetings of clubs or organizations: Never    Relationship status: Never married  Other Topics Concern  . Not on file  Social History Narrative   Patient  recently moved to [place] from [place] in May 2015.   Patient was born in [place], [place].   Patient has brother in [place] area.   Long-term care resident of [facility].    Outpatient Encounter Medications as of 06/26/2018  Medication Sig  . amLODipine (NORVASC) 10 MG tablet Take 10 mg by mouth daily. Hold for SBP <100MM/HG  . bisacodyl (BISAC-EVAC) 10 MG suppository Place 10 mg rectally once as needed for moderate constipation (FOR CONSTIPATION NOT RELIEVED BY MILK OF MAGNESIA).   . calcium acetate (PHOSLO) 667 MG capsule Take 1,334 mg by mouth 3 (three) times daily.   . cloNIDine (CATAPRES - DOSED IN MG/24 HR) 0.2 mg/24hr patch Place 1 patch (0.2 mg total) onto the skin once a week.  . cloNIDine (CATAPRES) 0.1 MG tablet Take 0.1 mg by mouth every 8 (eight) hours as needed (for a systolic B/P of 676 or greater).   . hydrocortisone cream 1 % Apply 1 application topically every 8 (eight) hours as needed for itching. Apply to rectum for hemorrhoids  . isosorbide mononitrate (IMDUR) 60 MG 24 hr tablet Take 60 mg by mouth daily.   Marland Kitchen lamoTRIgine (LAMICTAL) 25 MG tablet Take 50 mg by mouth daily.   Marland Kitchen latanoprost (XALATAN) 0.005 % ophthalmic solution Place 1 drop into both eyes at bedtime.   Marland Kitchen loperamide (IMODIUM A-D) 2 MG tablet Take 4 mg by mouth as needed for diarrhea or loose stools. The patient requests this for dialysis days.  Marland Kitchen LORazepam (ATIVAN) 0.5 MG tablet Take 1 tablet (0.5 mg total) by mouth at bedtime.  Marland Kitchen losartan (COZAAR) 50 MG tablet Take 50 mg by mouth at bedtime.  . multivitamin (RENA-VIT) TABS tablet Take 1 tablet by mouth daily.  . ondansetron (ZOFRAN) 4 MG tablet Take 4 mg by mouth every 8 (eight) hours as needed for nausea or vomiting.  . ranitidine (ZANTAC) 300 MG tablet Take 300 mg by mouth at bedtime. Reported on 03/05/2016  . saccharomyces boulardii (FLORASTOR) 250 MG capsule Take 250 mg by mouth 2 (two) times daily as needed (Loose stool).   Marland Kitchen  senna-docusate (SENEXON-S) 8.6-50 MG tablet Take 2 tablets by mouth at bedtime as needed.   . sertraline (ZOLOFT) 50 MG tablet Take 50 mg by mouth daily.   . sevelamer carbonate (RENVELA) 800 MG tablet Take 2 tablets (1,600 mg total) by mouth 3 (three) times daily with meals.  . simethicone (MYLICON) 720 MG chewable tablet Chew 125 mg by mouth 3 (three) times daily. Take 1 tablet by mouth prior to each meal at 8AM, 11AM, and 6PM  . valACYclovir (VALTREX) 500 MG tablet  Take 1 tablet (500 mg total) by mouth every other day.   No facility-administered encounter medications on file as of 06/26/2018.     Activities of Daily Living In your present state of health, do you have any difficulty performing the following activities: 06/26/2018 05/28/2018  Hearing? N N  Vision? N N  Difficulty concentrating or making decisions? N Y  Walking or climbing stairs? Y Y  Dressing or bathing? Y Y  Doing errands, shopping? Tempie Donning  Preparing Food and eating ? Y -  Using the Toilet? N -  In the past six months, have you accidently leaked urine? N -  Do you have problems with loss of bowel control? N -  Managing your Medications? Y -  Managing your Finances? Y -  Housekeeping or managing your Housekeeping? Y -  Some recent data might be hidden    Patient Care Team: Hendricks Limes, MD as PCP - General (Internal Medicine) Medina-Vargas, Senaida Lange, NP as Nurse Practitioner (Internal Medicine)    Assessment:   This is a routine wellness examination for [place].  Exercise Activities and Dietary recommendations Current Exercise Habits: Home exercise routine, Type of exercise: walking, Time (Minutes): 30, Frequency (Times/Week): 3, Weekly Exercise (Minutes/Week): 90, Exercise limited by: orthopedic condition(s)  Goals   None     Fall Risk Fall Risk  06/26/2018 06/06/2017 11/09/2016 06/15/2016 05/25/2016  Falls in the past year? Yes No No No No  Number falls in past yr: 2 or more - - - -  Injury with Fall? No - -  - -  Risk Factor Category  - - - - -  Risk for fall due to : - - - - -  Risk for fall due to: Comment - - - - -  Follow up - - - - -   Is the patient's home free of loose throw rugs in walkways, pet beds, electrical cords, etc?   yes      Grab bars in the bathroom? yes      Handrails on the stairs?   yes      Adequate lighting?   yes  Timed Get Up and Go performed: 14 seconds  Depression Screen PHQ 2/9 Scores 06/26/2018 06/06/2017 03/28/2016 02/16/2015  PHQ - 2 Score 0 0 - 0  Exception Documentation - - Other- indicate reason in comment box -  Not completed - - Patient at nursing facility. -     Cognitive Function     6CIT Screen 06/26/2018 06/06/2017  What Year? 4 points 0 points  What month? 0 points 0 points  What time? 0 points 0 points  Count back from 20 0 points 0 points  Months in reverse 0 points 0 points  Repeat phrase 0 points 0 points  Total Score 4 0    Immunization History  Administered Date(s) Administered  . Influenza,inj,Quad PF,6+ Mos 07/23/2014, 08/01/2016  . Influenza,inj,quad, With Preservative 08/07/2017  . PPD Test 04/13/2015  . Pneumococcal Polysaccharide-23 07/18/2017    Qualifies for Shingles Vaccine? Not in past records  Screening Tests Health Maintenance  Topic Date Due  . INFLUENZA VACCINE  07/31/2018 (Originally 05/01/2018)  . PNA vac Low Risk Adult (2 of 2 - PCV13) 08/29/2018 (Originally 07/18/2018)  . DEXA SCAN  11/28/2018 (Originally 03/20/2004)  . TETANUS/TDAP  11/29/2023 (Originally 03/20/1960)    Cancer Screenings: Lung: Low Dose CT Chest recommended if Age 49-80 years, 30 pack-year currently smoking OR have quit w/in 15years. Patient does not qualify. Breast:  Up to date on Mammogram? yes Up to date of Bone Density/Dexa? No, ordered Colorectal: up to date  Additional Screenings:  Hepatitis C Screening: declined Flu vaccine due: will receive at [facility] due: ordered Prevnar due: ordered    Plan:  I have personally reviewed  and addressed the Medicare Annual Wellness questionnaire and have noted the following in the patient's chart:  A. Medical and social history B. Use of alcohol, tobacco or illicit drugs  C. Current medications and supplements D. Functional ability and status E.  Nutritional status F.  Physical activity G. Advance directives H. List of other physicians I.  Hospitalizations, surgeries, and ER visits in previous 12 months J.  [place] to include hearing, vision, cognitive, depression L. Referrals and appointments - none  In addition, I have reviewed and discussed with patient certain preventive protocols, quality metrics, and best practice recommendations. A written personalized care plan for preventive services as well as general preventive health recommendations were provided to patient.  See attached scanned questionnaire for additional information.   Signed,   Tyson Dense, RN Nurse Health Advisor  Patient Concerns: None

## 2018-06-26 NOTE — Patient Instructions (Signed)
[name] , Thank you for taking time to come for your Medicare Wellness Visit. I appreciate your ongoing commitment to your health goals. Please review the following plan we discussed and let me know if I can assist you in the future.   Screening recommendations/referrals: Colonoscopy excluded, over age 79 Mammogram excluded, over age 42 Bone Density due, ordered Recommended yearly ophthalmology/optometry visit for glaucoma screening and checkup Recommended yearly dental visit for hygiene and checkup  Vaccinations: Influenza vaccine due, will receive at [facility] Pneumococcal vaccine 13 due, ordered Tdap vaccine due, ordered Shingles vaccine not in past records    Advanced directives: In chart  Conditions/risks identified: none  Next appointment: Dr. Linna Darner makes rounds   Preventive Care 28 Years and Older, Female Preventive care refers to lifestyle choices and visits with your health care provider that can promote health and wellness. What does preventive care include?  A yearly physical exam. This is also called an annual well check.  Dental exams once or twice a year.  Routine eye exams. Ask your health care provider how often you should have your eyes checked.  Personal lifestyle choices, including:  Daily care of your teeth and gums.  Regular physical activity.  Eating a healthy diet.  Avoiding tobacco and drug use.  Limiting alcohol use.  Practicing safe sex.  Taking low-dose aspirin every day.  Taking vitamin and mineral supplements as recommended by your health care provider. What happens during an annual well check? The services and screenings done by your health care provider during your annual well check will depend on your age, overall health, lifestyle risk factors, and family history of disease. Counseling  Your health care provider may ask you questions about your:  Alcohol use.  Tobacco use.  Drug use.  Emotional well-being.  Home and  relationship well-being.  Sexual activity.  Eating habits.  History of falls.  Memory and ability to understand (cognition).  Work and work Statistician.  Reproductive health. Screening  You may have the following tests or measurements:  Height, weight, and BMI.  Blood pressure.  Lipid and cholesterol levels. These may be checked every 5 years, or more frequently if you are over 65 years old.  Skin check.  Lung cancer screening. You may have this screening every year starting at age 24 if you have a 30-pack-year history of smoking and currently smoke or have quit within the past 15 years.  Fecal occult blood test (FOBT) of the stool. You may have this test every year starting at age 23.  Flexible sigmoidoscopy or colonoscopy. You may have a sigmoidoscopy every 5 years or a colonoscopy every 10 years starting at age 71.  Hepatitis C blood test.  Hepatitis B blood test.  Sexually transmitted disease (STD) testing.  Diabetes screening. This is done by checking your blood sugar (glucose) after you have not eaten for a while (fasting). You may have this done every 1-3 years.  Bone density scan. This is done to screen for osteoporosis. You may have this done starting at age 79.  Mammogram. This may be done every 1-2 years. Talk to your health care provider about how often you should have regular mammograms. Talk with your health care provider about your test results, treatment options, and if necessary, the need for more tests. Vaccines  Your health care provider may recommend certain vaccines, such as:  Influenza vaccine. This is recommended every year.  Tetanus, diphtheria, and acellular pertussis (Tdap, Td) vaccine. You may need a Td  booster every 10 years.  Zoster vaccine. You may need this after age 59.  Pneumococcal 13-valent conjugate (PCV13) vaccine. One dose is recommended after age 22.  Pneumococcal polysaccharide (PPSV23) vaccine. One dose is recommended after  age 35. Talk to your health care provider about which screenings and vaccines you need and how often you need them. This information is not intended to replace advice given to you by your health care provider. Make sure you discuss any questions you have with your health care provider. Document Released: 10/14/2015 Document Revised: 06/06/2016 Document Reviewed: 07/19/2015 Elsevier Interactive Patient Education  2017 [employer] Prevention in the Home Falls can cause injuries. They can happen to people of all ages. There are many things you can do to make your home safe and to help prevent falls. What can I do on the outside of my home?  Regularly fix the edges of walkways and driveways and fix any cracks.  Remove anything that might make you trip as you walk through a door, such as a raised step or threshold.  Trim any bushes or trees on the path to your home.  Use bright outdoor lighting.  Clear any walking paths of anything that might make someone trip, such as rocks or tools.  Regularly check to see if handrails are loose or broken. Make sure that both sides of any steps have handrails.  Any raised decks and porches should have guardrails on the edges.  Have any leaves, snow, or ice cleared regularly.  Use sand or salt on walking paths during winter.  Clean up any spills in your garage right away. This includes oil or grease spills. What can I do in the bathroom?  Use night lights.  Install grab bars by the toilet and in the tub and shower. Do not use towel bars as grab bars.  Use non-skid mats or decals in the tub or shower.  If you need to sit down in the shower, use a plastic, non-slip stool.  Keep the floor dry. Clean up any water that spills on the floor as soon as it happens.  Remove soap buildup in the tub or shower regularly.  Attach bath mats securely with double-sided non-slip rug tape.  Do not have throw rugs and other things on the floor that can  make you trip. What can I do in the bedroom?  Use night lights.  Make sure that you have a light by your bed that is easy to reach.  Do not use any sheets or blankets that are too big for your bed. They should not hang down onto the floor.  Have a firm chair that has side arms. You can use this for support while you get dressed.  Do not have throw rugs and other things on the floor that can make you trip. What can I do in the kitchen?  Clean up any spills right away.  Avoid walking on wet floors.  Keep items that you use a lot in easy-to-reach places.  If you need to reach something above you, use a strong step stool that has a grab bar.  Keep electrical cords out of the way.  Do not use floor polish or wax that makes floors slippery. If you must use wax, use non-skid floor wax.  Do not have throw rugs and other things on the floor that can make you trip. What can I do with my stairs?  Do not leave any items on the stairs.  Make sure that there are handrails on both sides of the stairs and use them. Fix handrails that are broken or loose. Make sure that handrails are as long as the stairways.  Check any carpeting to make sure that it is firmly attached to the stairs. Fix any carpet that is loose or worn.  Avoid having throw rugs at the top or bottom of the stairs. If you do have throw rugs, attach them to the floor with carpet tape.  Make sure that you have a light switch at the top of the stairs and the bottom of the stairs. If you do not have them, ask someone to add them for you. What else can I do to help prevent falls?  Wear shoes that:  Do not have high heels.  Have rubber bottoms.  Are comfortable and fit you well.  Are closed at the toe. Do not wear sandals.  If you use a stepladder:  Make sure that it is fully opened. Do not climb a closed stepladder.  Make sure that both sides of the stepladder are locked into place.  Ask someone to hold it for you,  if possible.  Clearly mark and make sure that you can see:  Any grab bars or handrails.  First and last steps.  Where the edge of each step is.  Use tools that help you move around (mobility aids) if they are needed. These include:  Canes.  Walkers.  Scooters.  Crutches.  Turn on the lights when you go into a dark area. Replace any light bulbs as soon as they burn out.  Set up your furniture so you have a clear path. Avoid moving your furniture around.  If any of your floors are uneven, fix them.  If there are any pets around you, be aware of where they are.  Review your medicines with your doctor. Some medicines can make you feel dizzy. This can increase your chance of falling. Ask your doctor what other things that you can do to help prevent falls. This information is not intended to replace advice given to you by your health care provider. Make sure you discuss any questions you have with your health care provider. Document Released: 07/14/2009 Document Revised: 02/23/2016 Document Reviewed: 10/22/2014 Elsevier Interactive Patient Education  2017 [employer].

## 2018-07-16 ENCOUNTER — Non-Acute Institutional Stay (SKILLED_NURSING_FACILITY): Payer: Medicare Other | Admitting: Adult Health

## 2018-07-16 ENCOUNTER — Encounter: Payer: Self-pay | Admitting: Adult Health

## 2018-07-16 DIAGNOSIS — I10 Essential (primary) hypertension: Secondary | ICD-10-CM

## 2018-07-16 DIAGNOSIS — N186 End stage renal disease: Secondary | ICD-10-CM | POA: Diagnosis not present

## 2018-07-16 DIAGNOSIS — K219 Gastro-esophageal reflux disease without esophagitis: Secondary | ICD-10-CM | POA: Diagnosis not present

## 2018-07-16 DIAGNOSIS — F339 Major depressive disorder, recurrent, unspecified: Secondary | ICD-10-CM

## 2018-07-16 DIAGNOSIS — F39 Unspecified mood [affective] disorder: Secondary | ICD-10-CM

## 2018-07-16 DIAGNOSIS — Z992 Dependence on renal dialysis: Secondary | ICD-10-CM

## 2018-07-16 NOTE — Progress Notes (Signed)
Location:  [facility] Room Number: 109-A Place of Service:  SNF (31) Provider:  Durenda Age, NP  Patient Care Team: Hendricks Limes, MD as PCP - General (Internal Medicine) Medina-Vargas, Senaida Lange, NP as Nurse Practitioner (Internal Medicine)  Extended Emergency Contact Information Primary Emergency Contact: Wilburn Mylar [place] of [place] Phone: [phone] Mobile Phone: [phone] Relation: Daughter Secondary Emergency Contact: Odie Sera [place] of [place] Mobile Phone: [phone] Relation: Brother  Code Status:  Full Code  Goals of care: Advanced Directive information Advanced Directives 06/26/2018  Does Patient Have a Medical Advance Directive? No  Type of Advance Directive -  Does patient want to make changes to medical advance directive? -  Copy of [place] in Chart? -  Would patient like information on creating a medical advance directive? No - Patient declined     Chief Complaint  Patient presents with  . Medical Management of Chronic Issues    Routine [facility] visit    HPI:  Pt is a 77 y.o. female seen today for medical management of chronic diseases.  She is a long-term care resident of [facility].  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 She was seen in her room today. She denies nausea nor pain. BPs are stable -149/78, 128/87, 152/79, 128/62, 129/82. She continues to have hemodialysis Q Tuesdays, Thursdays, and Saturdays.   Past Medical History:  Diagnosis Date  . Anxiety   . Closed fracture of right distal femur ([place]) 09/01/2015  . Depression   . ESRD (end stage renal disease) on dialysis [facility])    "TTS; [place] [facility]" (04/24/2017)  . GERD (gastroesophageal reflux disease) 04/23/2015  . HCAP (healthcare-associated pneumonia) 04/24/2017  . Heart murmur   . History of blood  transfusion    "low HgB when I was going to dialysis center"  . Hypertension   . Hypertension associated with end stage renal disease on dialysis 03/11/2014  . Malnutrition of moderate degree 09/02/2015  . Multiple myeloma ([place]) 03/11/2014  . Patient is Jehov[employer]'s Witness    "I'd rather never have any blood transfusions unless absolutely necessary; please check with me 1st". (04/24/2017)  . Pneumonia ~ 2016   Past Surgical History:  Procedure Laterality Date  . AV FISTULA PLACEMENT Left   . I&D EXTREMITY Left 02/27/2016   Procedure: ARTHROSCOPIC IRRIGATION AND DEBRIDEMENT EXTREMITY;  Surgeon: Renette Butters, MD;  Location: [facility];  Service: Orthopedics;  Laterality: Left;  . TEE WITHOUT CARDIOVERSION N/A 02/29/2016   Procedure: TRANSESOPHAGEAL ECHOCARDIOGRAM (TEE);  Surgeon: Thayer Headings, MD;  Location: [place];  Service: Cardiovascular;  Laterality: N/A;    Allergies  Allergen Reactions  . Phenergan [Promethazine Hcl] Other (See Comments)    Acute encephalopathy in the context of refusal to go to hemodialysis and administration of Phenergan for nausea and vomiting    Outpatient Encounter Medications as of 07/16/2018  Medication Sig  . amLODipine (NORVASC) 10 MG tablet Take 10 mg by mouth daily. Hold for SBP <100MM/HG  . bisacodyl (BISAC-EVAC) 10 MG suppository Place 10 mg rectally once as needed for moderate constipation (FOR CONSTIPATION NOT RELIEVED BY MILK OF MAGNESIA).   . calcium acetate (PHOSLO) 667 MG capsule Take 1,334 mg by mouth 3 (three) times daily.   . cloNIDine (CATAPRES - DOSED IN MG/24 HR) 0.2 mg/24hr patch Place 1 patch (0.2 mg total) onto the skin once a week.  Marland Kitchen  cloNIDine (CATAPRES) 0.1 MG tablet Take 0.1 mg by mouth every 8 (eight) hours as needed (for a systolic B/P of 761 or greater).   . hydrocortisone cream 1 % Apply 1 application topically every 8 (eight) hours as needed for itching. Apply to rectum for hemorrhoids  . isosorbide mononitrate (IMDUR) 60 MG  24 hr tablet Take 60 mg by mouth daily.   Marland Kitchen lamoTRIgine (LAMICTAL) 25 MG tablet Take 50 mg by mouth daily.   Marland Kitchen latanoprost (XALATAN) 0.005 % ophthalmic solution Place 1 drop into both eyes at bedtime.   Marland Kitchen loperamide (IMODIUM A-D) 2 MG tablet Take 4 mg by mouth as needed for diarrhea or loose stools. The patient requests this for dialysis days.  Marland Kitchen LORazepam (ATIVAN) 0.5 MG tablet Take 1 tablet (0.5 mg total) by mouth at bedtime.  Marland Kitchen losartan (COZAAR) 50 MG tablet Take 50 mg by mouth at bedtime.  . multivitamin (RENA-VIT) TABS tablet Take 1 tablet by mouth daily.  . ondansetron (ZOFRAN) 4 MG tablet Take 4 mg by mouth every 8 (eight) hours as needed for nausea or vomiting.  . ranitidine (ZANTAC) 300 MG tablet Take 300 mg by mouth at bedtime. Reported on 03/05/2016  . saccharomyces boulardii (FLORASTOR) 250 MG capsule Take 250 mg by mouth See admin instructions. Take one capsule BID on M-W-F for supplement and BID PRN for loose stool.  . senna-docusate (SENEXON-S) 8.6-50 MG tablet Take 2 tablets by mouth at bedtime as needed.   . sertraline (ZOLOFT) 50 MG tablet Take 50 mg by mouth daily.   . sevelamer carbonate (RENVELA) 800 MG tablet Take 2 tablets (1,600 mg total) by mouth 3 (three) times daily with meals.  . simethicone (MYLICON) 607 MG chewable tablet Chew 125 mg by mouth 3 (three) times daily. Take 1 tablet by mouth prior to each meal at 8AM, 11AM, and 6PM  . valACYclovir (VALTREX) 500 MG tablet Take 1 tablet (500 mg total) by mouth every other day.  . [DISCONTINUED] saccharomyces boulardii (FLORASTOR) 250 MG capsule Take 250 mg by mouth 2 (two) times daily as needed (Loose stool).    No facility-administered encounter medications on file as of 07/16/2018.     Review of Systems  GENERAL: No change in appetite, no fatigue, no weight changes, no fever, chills or weakness MOUTH and THROAT: Denies oral discomfort, gingival pain or bleeding, pain from teeth or hoarseness   RESPIRATORY: no cough,  SOB, DOE, wheezing, hemoptysis CARDIAC: No chest pain, edema or palpitations GI: No abdominal pain, diarrhea, constipation, heart burn, nausea or vomiting PSYCHIATRIC: Denies feelings of depression or anxiety. No report of hallucinations, insomnia, paranoia, or agitation   Immunization History  Administered Date(s) Administered  . Influenza,inj,Quad PF,6+ Mos 07/23/2014, 08/01/2016  . Influenza,inj,quad, With Preservative 08/07/2017  . PPD Test 04/13/2015  . Pneumococcal Polysaccharide-23 07/18/2017   Pertinent  Health Maintenance Due  Topic Date Due  . INFLUENZA VACCINE  07/31/2018 (Originally 05/01/2018)  . PNA vac Low Risk Adult (2 of 2 - PCV13) 08/29/2018 (Originally 07/18/2018)  . DEXA SCAN  11/28/2018 (Originally 03/20/2004)   Fall Risk  06/26/2018 06/06/2017 11/09/2016 06/15/2016 05/25/2016  Falls in the past year? Yes No No No No  Number falls in past yr: 2 or more - - - -  Injury with Fall? No - - - -  Risk Factor Category  - - - - -  Risk for fall due to : - - - - -  Risk for fall due to: Comment - - - - -  Follow up - - - - -      Vitals:   07/16/18 1223  BP: 129/87  Pulse: 68  Resp: 18  Temp: (!) 97.3 F (36.3 C)  TempSrc: Oral  SpO2: 95%  Weight: 127 lb 9.6 oz (57.9 kg)  Height: '5\' 7"'  (1.702 m)   Body mass index is 19.98 kg/m.  Physical Exam  GENERAL APPEARANCE:  In no acute distress.  SKIN:  Skin is warm and dry.  MOUTH and THROAT: Lips are without lesions. Oral mucosa is moist and without lesions. Tongue is normal in shape, size, and color and without lesions RESPIRATORY: Breathing is even & unlabored, BS CTAB CARDIAC: RRR, no murmur,no extra heart sounds, no edema, left upper arm AV fistula +bruit/thrill GI: Abdomen soft, normal BS, no masses, no tenderness EXTREMITIES:  Able to move X 4 extremities NEUROLOGICAL: There is no tremor. Speech is clear PSYCHIATRIC: Alert and oriented X 3. Affect and behavior are appropriate    Labs reviewed: Recent Labs     05/27/18 1715 05/27/18 1723 05/28/18 0024 05/29/18 0526  NA 138 137 137 136  K 4.0 4.0 3.7 3.4*  CL 97* 97* 97* 99  CO2 29  --  29 27  GLUCOSE 101* 98 110* 74  BUN 44* 47* 46* 19  CREATININE 8.68* 9.30* 8.88* 5.95*  CALCIUM 10.0  --  10.0 9.2  MG  --   --   --  2.1  PHOS  --   --   --  2.9   Recent Labs    03/27/18 2235 04/16/18 0929 05/27/18 1715 05/29/18 0526  AST '30 22 22  ' --   ALT '16 12 11  ' --   ALKPHOS 34* 42 53  --   BILITOT 0.4 0.8 0.9  --   PROT 8.3* 9.5* 7.5  --   ALBUMIN 2.8* 3.8 3.4* 3.2*   Recent Labs    04/18/18 0330  04/24/18 05/01/18 05/27/18 1715 05/27/18 1723 05/28/18 0024 05/29/18 0526  WBC 2.6*   < > 3.3 3.3 5.1  --   --  4.6  NEUTROABS 1.7  --  2  --  4.3  --   --  3.7  HGB 9.3*  --  8.6* 9.3* 10.5* 12.2  --  10.2*  HCT 30.1*  --  26* 30* 35.0* 36.0 34.1 33.0*  MCV 111.5*  --   --   --  113.3*  --   --  110.0*  PLT 123*  --  148* 109* 147*  --   --  156   < > = values in this interval not displayed.   Lab Results  Component Value Date   TSH 2.306 04/16/2018   Lab Results  Component Value Date   HGBA1C 4.9 03/13/2016   Lab Results  Component Value Date   CHOL 202 (A) 11/27/2017   HDL 77 (A) 11/27/2017   LDLCALC 112 11/27/2017   TRIG 65 11/27/2017    Assessment/Plan  1. ESRD needing dialysis [facility]) -  continue hemodialysis every Tuesdays, Thursdays and Saturdays, Renvela 800 mg 2 tabs = 1600 mg 3 times a day with meals, calcium acetate 667 mg 2 capsules 3 times a day   2. Hypertension, accelerated -stable, continue clonidine 0.1 mg 1 tab every 8 hours as needed for SBP > 180, amlodipine 10 mg 1 tab daily, losartan 50 mg 1 tab nightly   3. Gastroesophageal reflux disease without esophagitis -stable, continue ranitidine 300 mg 1 tab nightly   4.  Depression, recurrent ([place]) - attends daily activities, continue sertraline 50 mg 1 tab daily, followed up by team health psych NP   5. Mood disorder ([place]) - mood is stable, continue  lamotrigine 25 mg 1 tab daily    Family/ staff Communication: Discussed plan of care with resident.  Labs/tests ordered:  None  Goals of care:   Long-term care.   Durenda Age, NP [facility] [phone] (Monday-Friday 8:00 a.m. - 5:00 p.m.) [phone] (after hours)

## 2018-07-18 ENCOUNTER — Other Ambulatory Visit: Payer: Self-pay

## 2018-07-18 MED ORDER — LORAZEPAM 0.5 MG PO TABS: 0.5000 mg | ORAL_TABLET | Freq: Every day | ORAL | 0 refills | Status: DC

## 2018-07-18 NOTE — Telephone Encounter (Signed)
Hard script written by Durenda Age, NP.  CMA faxed to pharmacy and informed nurse.

## 2018-07-26 ENCOUNTER — Emergency Department (HOSPITAL_COMMUNITY)
Admission: EM | Admit: 2018-07-26 | Discharge: 2018-07-26 | Disposition: A | Discharge status: Home / Self Care | Payer: Medicare Other | Attending: Emergency Medicine | Admitting: Emergency Medicine

## 2018-07-26 ENCOUNTER — Emergency Department (HOSPITAL_COMMUNITY): Payer: Medicare Other

## 2018-07-26 DIAGNOSIS — Y9389 Activity, other specified: Secondary | ICD-10-CM | POA: Diagnosis not present

## 2018-07-26 DIAGNOSIS — W19XXXA Unspecified fall, initial encounter: Secondary | ICD-10-CM

## 2018-07-26 DIAGNOSIS — Z992 Dependence on renal dialysis: Secondary | ICD-10-CM | POA: Diagnosis not present

## 2018-07-26 DIAGNOSIS — S0181XA Laceration without foreign body of other part of head, initial encounter: Secondary | ICD-10-CM | POA: Insufficient documentation

## 2018-07-26 DIAGNOSIS — Y998 Other external cause status: Secondary | ICD-10-CM | POA: Insufficient documentation

## 2018-07-26 DIAGNOSIS — N186 End stage renal disease: Secondary | ICD-10-CM | POA: Insufficient documentation

## 2018-07-26 DIAGNOSIS — S0990XA Unspecified injury of head, initial encounter: Secondary | ICD-10-CM | POA: Insufficient documentation

## 2018-07-26 DIAGNOSIS — Y92122 Bedroom in nursing home as the place of occurrence of the external cause: Secondary | ICD-10-CM | POA: Diagnosis not present

## 2018-07-26 DIAGNOSIS — I120 Hypertensive chronic kidney disease with stage 5 chronic kidney disease or end stage renal disease: Secondary | ICD-10-CM | POA: Insufficient documentation

## 2018-07-26 DIAGNOSIS — Z79899 Other long term (current) drug therapy: Secondary | ICD-10-CM | POA: Insufficient documentation

## 2018-07-26 DIAGNOSIS — W06XXXA Fall from bed, initial encounter: Secondary | ICD-10-CM | POA: Insufficient documentation

## 2018-07-26 DIAGNOSIS — S0101XA Laceration without foreign body of scalp, initial encounter: Secondary | ICD-10-CM

## 2018-07-26 LAB — COMPREHENSIVE METABOLIC PANEL
ALT: 10 U/L (ref 0–44)
ANION GAP: 10 (ref 5–15)
AST: 18 U/L (ref 15–41)
Albumin: 3.4 g/dL — ABNORMAL LOW (ref 3.5–5.0)
Alkaline Phosphatase: 57 U/L (ref 38–126)
BILIRUBIN TOTAL: 0.7 mg/dL (ref 0.3–1.2)
BUN: 14 mg/dL (ref 8–23)
CO2: 30 mmol/L (ref 22–32)
Calcium: 9.6 mg/dL (ref 8.9–10.3)
Chloride: 97 mmol/L — ABNORMAL LOW (ref 98–111)
Creatinine, Ser: 4 mg/dL — ABNORMAL HIGH (ref 0.44–1.00)
GFR calc Af Amer: 11 mL/min — ABNORMAL LOW (ref >60)
GFR, EST NON AFRICAN AMERICAN: 10 mL/min — AB (ref >60)
Glucose, Bld: 87 mg/dL (ref 70–99)
POTASSIUM: 3.2 mmol/L — AB (ref 3.5–5.1)
Sodium: 137 mmol/L (ref 135–145)
TOTAL PROTEIN: 8.1 g/dL (ref 6.5–8.1)

## 2018-07-26 LAB — CBC WITH DIFFERENTIAL/PLATELET
Abs Immature Granulocytes: 0.02 10*3/uL (ref 0.00–0.07)
Basophils Absolute: 0 10*3/uL (ref 0.0–0.1)
Basophils Relative: 1 %
EOS ABS: 0.1 10*3/uL (ref 0.0–0.5)
EOS PCT: 4 %
HEMATOCRIT: 38.4 % (ref 36.0–46.0)
Hemoglobin: 10.9 g/dL — ABNORMAL LOW (ref 12.0–15.0)
Immature Granulocytes: 1 %
LYMPHS ABS: 0.3 10*3/uL — AB (ref 0.7–4.0)
Lymphocytes Relative: 11 %
MCH: 31.2 pg (ref 26.0–34.0)
MCHC: 28.4 g/dL — AB (ref 30.0–36.0)
MCV: 110 fL — AB (ref 80.0–100.0)
MONOS PCT: 11 %
Monocytes Absolute: 0.3 10*3/uL (ref 0.1–1.0)
NEUTROS PCT: 72 %
NRBC: 0 % (ref 0.0–0.2)
Neutro Abs: 2.3 10*3/uL (ref 1.7–7.7)
Platelets: 193 10*3/uL (ref 150–400)
RBC: 3.49 MIL/uL — ABNORMAL LOW (ref 3.87–5.11)
RDW: 15.3 % (ref 11.5–15.5)
WBC: 3.1 10*3/uL — ABNORMAL LOW (ref 4.0–10.5)

## 2018-07-26 LAB — I-STAT TROPONIN, ED: TROPONIN I, POC: 0.03 ng/mL (ref 0.00–0.08)

## 2018-07-26 MED ORDER — LIDOCAINE-EPINEPHRINE (PF) 2 %-1:200000 IJ SOLN
10.0000 mL | Freq: Once | INTRAMUSCULAR | Status: AC
  Administered 2018-07-26: 10 mL
  Filled 2018-07-26: qty 20

## 2018-07-26 MED ORDER — HYDROCODONE-ACETAMINOPHEN 5-325 MG PO TABS
1.0000 | ORAL_TABLET | Freq: Once | ORAL | Status: AC
  Administered 2018-07-26: 1 via ORAL
  Filled 2018-07-26: qty 1

## 2018-07-26 NOTE — Discharge Instructions (Addendum)
I am sorry you fell. You don't have any broken bones in your shoulder. The cut on your forehead was repaired with stitches. You will need to follow up with your primary doctor next week to make sure your cut is healing well and determine when it's appropriate to take the stitches out. If you see any signs of infection such as pain, swelling, redness, warmth, discharge, fevers, chills, please have it evaluated by a doctor. If you experience worsening pain, headache, chest pain, shortness of breath, confusion please come to the emergency department immediately.

## 2018-07-26 NOTE — ED Notes (Signed)
PTAR at bedside 

## 2018-07-26 NOTE — ED Notes (Signed)
Pt back from CT

## 2018-07-26 NOTE — ED Notes (Signed)
Patient verbalizes understanding of discharge instructions. Opportunity for questioning and answers were provided. Armband removed by staff, pt discharged from ED.  

## 2018-07-26 NOTE — ED Notes (Signed)
Pt in CT/XR

## 2018-07-26 NOTE — ED Provider Notes (Addendum)
[facility] EMERGENCY DEPARTMENT Provider Note   CSN: [id number] Arrival date & time: 07/26/18  1920     History   Chief Complaint Chief Complaint  Patient presents with  . Fall    HPI [name] is a 77 y.o. female with HTN, multiple myeloma, ESRD on HD who presents from [facility] for evaluation after a fall. She states she was transferring from bed to wheelchair and missed the wheelchair, falling on the floor. Her head began to bleed and she vomited 3 times right after she fell.  She denies associated chest pain, sob, abdominal pain, dizziness.   Currently, she is alert and oriented x 4. She endorses head pain and left shoulder pain.   She is not on blood thinners.   HPI  Past Medical History:  Diagnosis Date  . Anxiety   . Closed fracture of right distal femur ([place]) 09/01/2015  . Depression   . ESRD (end stage renal disease) on dialysis [facility])    "TTS; [facility]" (04/24/2017)  . GERD (gastroesophageal reflux disease) 04/23/2015  . HCAP (healthcare-associated pneumonia) 04/24/2017  . Heart murmur   . History of blood transfusion    "low HgB when I was going to dialysis center"  . Hypertension   . Hypertension associated with end stage renal disease on dialysis 03/11/2014  . Malnutrition of moderate degree 09/02/2015  . Multiple myeloma ([place]) 03/11/2014  . Patient is Jehovah's Witness    "I'd rather never have any blood transfusions unless absolutely necessary; please check with me 1st". (04/24/2017)  . Pneumonia ~ 2016    Patient Active Problem List   Diagnosis Date Noted  . Neurocognitive deficits 06/05/2018  . Altered mental status 05/28/2018  . Macrocytosis 05/01/2018  . Hypertension, uncontrolled 04/16/2018  . ESRD needing dialysis ([place]) 04/16/2018  . Encephalopathy acute 04/16/2018  . Anxiety and depression 04/16/2018  . Hypertension, accelerated 04/16/2018  . Pressure ulcer, stage I 04/16/2018  . Abnormal chest x-ray  12/26/2017  . HCAP (healthcare-associated pneumonia) 04/24/2017  . Sepsis ([place]) 04/24/2017  . Nausea and vomiting 04/24/2017  . Thrombocytopenia ([place]) 04/24/2017  . Hyperkalemia 01/16/2017  . IBS (irritable bowel syndrome) 12/27/2016  . Mood disorder ([place]) 07/19/2016  . Diastolic dysfunction 35/45/6256  . Insomnia 04/27/2016  . Hyponatremia 03/12/2016  . Diarrhea 03/12/2016  . Hypertensive urgency 02/18/2016  . Swelling of joint of left knee 02/18/2016  . Leukopenia due to antineoplastic chemotherapy ([place]) 09/03/2015  . Malnutrition of moderate degree 09/02/2015  . GERD (gastroesophageal reflux disease) 04/23/2015  . 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 Multiple myeloma ([place]) 03/11/2014  . Hypertension associatd with end stage renal disease on dialysis 03/11/2014    Past Surgical History:  Procedure Laterality Date  . AV FISTULA PLACEMENT Left   . I&D EXTREMITY Left 02/27/2016   Procedure: ARTHROSCOPIC IRRIGATION AND DEBRIDEMENT EXTREMITY;  Surgeon: Renette Butters, MD;  Location: [facility];  Service: Orthopedics;  Laterality: Left;  . TEE WITHOUT CARDIOVERSION N/A 02/29/2016   Procedure: TRANSESOPHAGEAL ECHOCARDIOGRAM (TEE);  Surgeon: Thayer Headings, MD;  Location: [place];  Service: Cardiovascular;  Laterality: N/A;     OB History   None      Home Medications    Prior to Admission medications   Medication Sig Start Date End Date Taking? Authorizing Provider  amLODipine (NORVASC) 10 MG tablet Take 10 mg by mouth daily. Hold for SBP <100MM/HG    [provider]  bisacodyl (BISAC-EVAC) 10 MG suppository Place 10  mg rectally once as needed for moderate constipation (FOR CONSTIPATION NOT RELIEVED BY MILK OF MAGNESIA).     [provider]  calcium acetate (PHOSLO) 667 MG capsule Take 1,334 mg by mouth 3 (three) times daily.     [provider]  cloNIDine (CATAPRES - DOSED IN MG/24 HR) 0.2 mg/24hr patch Place 1 patch (0.2 mg total) onto the  skin once a week. 06/04/18   Roxan Hockey, MD  cloNIDine (CATAPRES) 0.1 MG tablet Take 0.1 mg by mouth every 8 (eight) hours as needed (for a systolic B/P of 540 or greater).     [provider]  hydrocortisone cream 1 % Apply 1 application topically every 8 (eight) hours as needed for itching. Apply to rectum for hemorrhoids    [provider]  isosorbide mononitrate (IMDUR) 60 MG 24 hr tablet Take 60 mg by mouth daily.  03/24/15   [provider]  lamoTRIgine (LAMICTAL) 25 MG tablet Take 50 mg by mouth daily.     [provider]  latanoprost (XALATAN) 0.005 % ophthalmic solution Place 1 drop into both eyes at bedtime.     [provider]  loperamide (IMODIUM A-D) 2 MG tablet Take 4 mg by mouth as needed for diarrhea or loose stools. The patient requests this for dialysis days.    [provider]  LORazepam (ATIVAN) 0.5 MG tablet Take 1 tablet (0.5 mg total) by mouth at bedtime. 07/18/18   Medina-Vargas, Monina C, NP  losartan (COZAAR) 50 MG tablet Take 50 mg by mouth at bedtime.    [provider]  multivitamin (RENA-VIT) TABS tablet Take 1 tablet by mouth daily.    [provider]  ondansetron (ZOFRAN) 4 MG tablet Take 4 mg by mouth every 8 (eight) hours as needed for nausea or vomiting.    [provider]  ranitidine (ZANTAC) 300 MG tablet Take 300 mg by mouth at bedtime. Reported on 03/05/2016    [provider]  saccharomyces boulardii (FLORASTOR) 250 MG capsule Take 250 mg by mouth See admin instructions. Take one capsule BID on M-W-F for supplement and BID PRN for loose stool.    [provider]  senna-docusate (SENEXON-S) 8.6-50 MG tablet Take 2 tablets by mouth at bedtime as needed.     [provider]  sertraline (ZOLOFT) 50 MG tablet Take 50 mg by mouth daily.     [provider]  sevelamer carbonate (RENVELA) 800 MG tablet Take 2 tablets (1,600 mg total) by mouth 3 (three)  times daily with meals. 05/30/18   Roxan Hockey, MD  simethicone (MYLICON) 981 MG chewable tablet Chew 125 mg by mouth 3 (three) times daily. Take 1 tablet by mouth prior to each meal at 8AM, 11AM, and 6PM    [provider]  valACYclovir (VALTREX) 500 MG tablet Take 1 tablet (500 mg total) by mouth every other day. 03/01/16   Hongalgi, Lenis Dickinson, MD    Family History Family History  Problem Relation Age of Onset  . Hypertension Mother   . Hypertension Father     Social History Social History   Tobacco Use  . Smoking status: Never Smoker  . Smokeless tobacco: Never Used  Substance Use Topics  . Alcohol use: No    Alcohol/week: 0.0 standard drinks  . Drug use: No     Allergies   Phenergan [promethazine hcl]   Review of Systems Review of Systems See HPI  Physical Exam Updated Vital Signs BP (!) 177/105  Pulse 62   Resp 13   Ht '5\' 3"'  (1.6 m)   Wt 57.9 kg   SpO2 95%   BMI 22.61 kg/m   Physical Exam Vitals:   07/26/18 2045 07/26/18 2100 07/26/18 2115 07/26/18 2130  BP: (!) 180/108 (!) 206/112 (!) 190/114 (!) 177/105  Pulse: (!) 48 64 62 62  Resp: '20 15 14 13  ' SpO2: 92% 100% 99% 95%  Weight:      Height:       General: Vital signs reviewed.  Patient is well-developed and well-nourished, in no acute distress and cooperative with exam.  Head: Normocephalic. 2cm laceration to left forehead. No battle sign or racoon eyes.  Eyes: EOMI, conjunctivae normal, PERRL  Neck: Supple, trachea midline, normal ROM, no midline tenderness.  Cardiovascular: RRR, S1 normal, S2 normal, no murmurs, gallops, or rubs. Pulmonary/Chest: Clear to auscultation bilaterally, no wheezes, rales, or rhonchi. Abdominal: Soft, non-tender, non-distended, BS +, no masses, organomegaly, or guarding present. No pelvic tenderness of instability  Musculoskeletal: left shoulder tender to palpation over glenohumeral joint. No swelling or noticeable deformity. No AC or clavicular tenderness.  Extremely limited passive and active ROM. Neurological: A&O x3, Strength is normal and symmetric bilaterally, cranial nerve II-XII are grossly intact, no focal motor deficit, sensory intact to light touch bilaterally.  Skin: Warm, dry and intact. No rashes or erythema.  ED Treatments / Results  Labs (all labs ordered are listed, but only abnormal results are displayed) Labs Reviewed  CBC WITH DIFFERENTIAL/PLATELET - Abnormal; Notable for the following components:      Result Value   WBC 3.1 (*)    RBC 3.49 (*)    Hemoglobin 10.9 (*)    MCV 110.0 (*)    MCHC 28.4 (*)    Lymphs Abs 0.3 (*)    All other components within normal limits  COMPREHENSIVE METABOLIC PANEL  I-STAT TROPONIN, ED    EKG EKG Interpretation  Date/Time:  Saturday July 26 2018 19:31:05 EDT Ventricular Rate:  55 PR Interval:    QRS Duration: 105 QT Interval:  491 QTC Calculation: 470 R Axis:   -38 Text Interpretation:  Sinus rhythm Prolonged PR interval Left ventricular hypertrophy No significant change since last tracing Confirmed by Wandra Arthurs [id number]) on 07/26/2018 7:47:45 PM   Radiology Ct Head Wo Contrast  Result Date: 07/26/2018 CLINICAL DATA:  Unwitnessed fall. Suspicion for head injury or cervical spine injury. EXAM: CT HEAD WITHOUT CONTRAST CT CERVICAL SPINE WITHOUT CONTRAST TECHNIQUE: Multidetector CT imaging of the head and cervical spine was performed following the standard protocol without intravenous contrast. Multiplanar CT image reconstructions of the cervical spine were also generated. COMPARISON:  05/27/2018 and older exams. FINDINGS: CT HEAD FINDINGS Brain: No evidence of acute infarction, hemorrhage, hydrocephalus, extra-axial collection or mass lesion/mass effect. Mild stable atrophy. Patchy areas of white matter hypoattenuation consistent with mild chronic microvascular ischemic change. Vascular: No hyperdense vessel or unexpected calcification. Skull: No skull fracture. There are  scattered well-defined skull lucencies stable from the prior study. These are consistent with multiple myeloma. Sinuses/Orbits: Visualized globes and orbits are unremarkable. Visualized sinuses and mastoid air cells are clear. Other: None. CT CERVICAL SPINE FINDINGS Alignment: Normal. Skull base and vertebrae: No fracture. Multiple lucent bone lesions consistent with multiple myeloma, stable from a cervical CT dated 04/10/2015. Soft tissues and spinal canal: No epidural mass or hematoma. No soft tissue mass or inflammation. Disc levels: Moderate loss of disc height throughout the cervical spine from C3-C4 through C6-C7. Mild  spondylotic disc bulging with endplate spurring. No convincing disc herniation. Upper chest: No acute findings.  Clear lung apices. Other: None. IMPRESSION: HEAD CT 1. No acute intracranial abnormalities. 2. Skull lesions consistent with multiple myeloma stable from the prior exam. CERVICAL CT 1. No fracture or acute finding. 2. Bone lesions consistent with multiple myeloma without significant change from the prior cervical spine CT. Electronically Signed   By: Lajean Manes M.D.   On: 07/26/2018 20:30   Ct Cervical Spine Wo Contrast  Result Date: 07/26/2018 CLINICAL DATA:  Unwitnessed fall. Suspicion for head injury or cervical spine injury. EXAM: CT HEAD WITHOUT CONTRAST CT CERVICAL SPINE WITHOUT CONTRAST TECHNIQUE: Multidetector CT imaging of the head and cervical spine was performed following the standard protocol without intravenous contrast. Multiplanar CT image reconstructions of the cervical spine were also generated. COMPARISON:  05/27/2018 and older exams. FINDINGS: CT HEAD FINDINGS Brain: No evidence of acute infarction, hemorrhage, hydrocephalus, extra-axial collection or mass lesion/mass effect. Mild stable atrophy. Patchy areas of white matter hypoattenuation consistent with mild chronic microvascular ischemic change. Vascular: No hyperdense vessel or unexpected calcification.  Skull: No skull fracture. There are scattered well-defined skull lucencies stable from the prior study. These are consistent with multiple myeloma. Sinuses/Orbits: Visualized globes and orbits are unremarkable. Visualized sinuses and mastoid air cells are clear. Other: None. CT CERVICAL SPINE FINDINGS Alignment: Normal. Skull base and vertebrae: No fracture. Multiple lucent bone lesions consistent with multiple myeloma, stable from a cervical CT dated 04/10/2015. Soft tissues and spinal canal: No epidural mass or hematoma. No soft tissue mass or inflammation. Disc levels: Moderate loss of disc height throughout the cervical spine from C3-C4 through C6-C7. Mild spondylotic disc bulging with endplate spurring. No convincing disc herniation. Upper chest: No acute findings.  Clear lung apices. Other: None. IMPRESSION: HEAD CT 1. No acute intracranial abnormalities. 2. Skull lesions consistent with multiple myeloma stable from the prior exam. CERVICAL CT 1. No fracture or acute finding. 2. Bone lesions consistent with multiple myeloma without significant change from the prior cervical spine CT. Electronically Signed   By: Lajean Manes M.D.   On: 07/26/2018 20:30   Dg Shoulder Left  Result Date: 07/26/2018 CLINICAL DATA:  Unwitnessed fall.  Pain. EXAM: LEFT SHOULDER - 2+ VIEW COMPARISON:  02/13/2018 FINDINGS: Tortuous ectatic thoracic aorta. The adjacent ribs and lung are nonacute. Chronic stable appearance of the humeral head without fracture. Osteoarthritis of the AC glenohumeral joints. Vascular clips project over the included left arm. IMPRESSION: Negative for acute fracture or malalignment of the left shoulder. Electronically Signed   By: Ashley Royalty M.D.   On: 07/26/2018 21:02    Procedures .Marland KitchenLaceration Repair Date/Time: 07/27/2018 1:16 AM Performed by: Isabelle Course, MD Authorized by: Drenda Freeze, MD   Consent:    Consent obtained:  Verbal   Consent given by:  Patient   Risks discussed:   Infection and pain   Alternatives discussed:  No treatment Anesthesia (see MAR for exact dosages):    Anesthesia method:  Local infiltration   Local anesthetic:  Lidocaine 2% WITH epi Laceration details:    Location:  Face   Face location:  Forehead   Length (cm):  3   Depth (mm):  7 Repair type:    Repair type:  Simple Pre-procedure details:    Preparation:  Imaging obtained to evaluate for foreign bodies and patient was prepped and draped in usual sterile fashion Exploration:    Hemostasis achieved with:  Direct pressure  Wound exploration: entire depth of wound probed and visualized     Wound extent: no fascia violation noted, no foreign bodies/material noted and no muscle damage noted     Contaminated: no   Treatment:    Area cleansed with:  Betadine   Amount of cleaning:  Standard Skin repair:    Repair method:  Sutures   Suture size:  5-0   Wound skin closure material used: absorbable vicryl, non-absorbable ethilon.   Suture technique:  Subcuticular and simple interrupted   Number of sutures:  5 (2 subcuticular, 3 simple interrupted) Approximation:    Approximation:  Close Post-procedure details:    Dressing:  Open (no dressing)   Patient tolerance of procedure:  Tolerated well, no immediate complications   (including critical care time)  Medications Ordered in ED Medications  HYDROcodone-acetaminophen (NORCO/VICODIN) 5-325 MG per tablet 1 tablet (1 tablet Oral Given 07/26/18 2135)  lidocaine-EPINEPHrine (XYLOCAINE W/EPI) 2 %-1:200000 (PF) injection 10 mL (10 mLs Infiltration Given 07/26/18 2136)     Initial Impression / Assessment and Plan / ED Course  I have reviewed the triage vital signs and the nursing notes.  Pertinent labs & imaging results that were available during my care of the patient were reviewed by me and considered in my medical decision making (see chart for details).     77yo female presenting from nursing home after mechanical fall while  transferring from bed to wheelchair. She is hypertensive on arrival and complaining of head laceration and severe shoulder pain. Denies chest pain, sob, abdominal pain, back pain, EKG without ischemic changes, prolonged PR interval unchanged from prior. Troponin undetectable.   Neutropenia stable from prior (chemo induced).   Laceration to the left forehead was repaired with 2 absorbable subcuticular sutures and 3 simple interrupted stiches.   Head and neck CT negative for acute fractures. Complaining of shoulder pain. No tenderness over clavicle. Left shoulder with very limited ROM, both passive and active. X-rays of the left shoulder without acute fracture or dislocation.   Patient was monitored in the ED. Vital signs stable. BP improved after pain medicine and laceration repair. Patient is safe to discharge with follow up for suture removal.   Final Clinical Impressions(s) / ED Diagnoses   Final diagnoses:  Laceration of scalp, initial encounter  Fall, initial encounter    ED Discharge Orders    None       Isabelle Course, MD 07/26/18 2249    Isabelle Course, MD 07/27/18 Layla Maw    Drenda Freeze, MD 07/29/18 [phone]

## 2018-07-26 NOTE — ED Notes (Signed)
Report given to [place] at [facility].  Waiting for PTAR at this time.

## 2018-07-26 NOTE — ED Notes (Signed)
[facility] called for transport.  

## 2018-07-26 NOTE — ED Triage Notes (Signed)
Per ems, patient from [facility], had an unwitnessed fall in her room. 1in lac on left forehead, steri strips placed by facility. Patient aox4. Bleeding controlled at this time. Patient not on blood thinners.

## 2018-08-08 ENCOUNTER — Encounter: Payer: Self-pay | Admitting: Adult Health

## 2018-08-08 ENCOUNTER — Non-Acute Institutional Stay (SKILLED_NURSING_FACILITY): Payer: Medicare Other | Admitting: Adult Health

## 2018-08-08 DIAGNOSIS — I120 Hypertensive chronic kidney disease with stage 5 chronic kidney disease or end stage renal disease: Secondary | ICD-10-CM | POA: Diagnosis not present

## 2018-08-08 DIAGNOSIS — N186 End stage renal disease: Secondary | ICD-10-CM

## 2018-08-08 DIAGNOSIS — F419 Anxiety disorder, unspecified: Secondary | ICD-10-CM

## 2018-08-08 DIAGNOSIS — Z992 Dependence on renal dialysis: Secondary | ICD-10-CM

## 2018-08-08 DIAGNOSIS — F339 Major depressive disorder, recurrent, unspecified: Secondary | ICD-10-CM

## 2018-08-08 DIAGNOSIS — K219 Gastro-esophageal reflux disease without esophagitis: Secondary | ICD-10-CM

## 2018-08-08 DIAGNOSIS — S01112S Laceration without foreign body of left eyelid and periocular area, sequela: Secondary | ICD-10-CM | POA: Diagnosis not present

## 2018-08-08 DIAGNOSIS — C9000 Multiple myeloma not having achieved remission: Secondary | ICD-10-CM | POA: Diagnosis not present

## 2018-08-08 DIAGNOSIS — F39 Unspecified mood [affective] disorder: Secondary | ICD-10-CM

## 2018-08-08 NOTE — Progress Notes (Signed)
Location:  [facility] Room Number: 109-A Place of Service:  SNF (31) Provider:  Durenda Age, NP  Patient Care Team: Hendricks Limes, MD as PCP - General (Internal Medicine) Medina-Vargas, Senaida Lange, NP as Nurse Practitioner (Internal Medicine)  Extended Emergency Contact Information Primary Emergency Contact: Wilburn Mylar [place] of [place] Phone: [phone] Mobile Phone: [phone] Relation: Daughter Secondary Emergency Contact: Odie Sera [place] of [place] Mobile Phone: [phone] Relation: Brother  Code Status:  Full Code  Goals of care: Advanced Directive information Advanced Directives 06/26/2018  Does Patient Have a Medical Advance Directive? No  Type of Advance Directive -  Does patient want to make changes to medical advance directive? -  Copy of [place] in Chart? -  Would patient like information on creating a medical advance directive? No - Patient declined     Chief Complaint  Patient presents with  . Medical Management of Chronic Issues    Routine [facility] visit    HPI:  Pt is a 77 y.o. female seen today for medical management of chronic diseases. She is a long-term care resident of [facility].  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 She had a recent fall and was brought to the ED. She sustained left eyebrow laceration which was sutured.   Past Medical History:  Diagnosis Date  . Anxiety   . Closed fracture of right distal femur ([place]) 09/01/2015  . Depression   . ESRD (end stage renal disease) on dialysis [facility])    "TTS; [place]" (04/24/2017)  . GERD (gastroesophageal reflux disease) 04/23/2015  . HCAP (healthcare-associated pneumonia) 04/24/2017  . Heart murmur   . History of blood transfusion    "low HgB when I was going to dialysis center"  . Hypertension   .  Hypertension associated with end stage renal disease on dialysis 03/11/2014  . Malnutrition of moderate degree 09/02/2015  . Multiple myeloma ([place]) 03/11/2014  . Patient is Jehovah's Witness    "I'd rather never have any blood transfusions unless absolutely necessary; please check with me 1st". (04/24/2017)  . Pneumonia ~ 2016   Past Surgical History:  Procedure Laterality Date  . AV FISTULA PLACEMENT Left   . I&D EXTREMITY Left 02/27/2016   Procedure: ARTHROSCOPIC IRRIGATION AND DEBRIDEMENT EXTREMITY;  Surgeon: Renette Butters, MD;  Location: [place];  Service: Orthopedics;  Laterality: Left;  . TEE WITHOUT CARDIOVERSION N/A 02/29/2016   Procedure: TRANSESOPHAGEAL ECHOCARDIOGRAM (TEE);  Surgeon: Thayer Headings, MD;  Location: [place];  Service: Cardiovascular;  Laterality: N/A;    Allergies  Allergen Reactions  . Phenergan [Promethazine Hcl] Other (See Comments)    Acute encephalopathy in the context of refusal to go to hemodialysis and administration of Phenergan for nausea and vomiting    Outpatient Encounter Medications as of 08/08/2018  Medication Sig  . acetaminophen (TYLENOL) 325 MG tablet Take 650 mg by mouth every 6 (six) hours as needed.  Marland Kitchen amLODipine (NORVASC) 10 MG tablet Take 10 mg by mouth daily. Hold for SBP <100MM/HG  . bisacodyl (BISAC-EVAC) 10 MG suppository Place 10 mg rectally once as needed for moderate constipation (FOR CONSTIPATION NOT RELIEVED BY MILK OF MAGNESIA).   . calcium acetate (PHOSLO) 667 MG capsule Take 1,334 mg by mouth 3 (three) times daily with meals.   . cloNIDine (CATAPRES - DOSED IN MG/24 HR) 0.2 mg/24hr patch Place 1 patch (0.2 mg  total) onto the skin once a week.  . cloNIDine (CATAPRES) 0.1 MG tablet Take 0.1 mg by mouth every 8 (eight) hours as needed (for a systolic B/P of 053 or greater).   . hydrocortisone cream 1 % Apply 1 application topically every 8 (eight) hours as needed for itching. Apply to rectum for hemorrhoids  . isosorbide  mononitrate (IMDUR) 60 MG 24 hr tablet Take 60 mg by mouth daily.   Marland Kitchen lamoTRIgine (LAMICTAL) 25 MG tablet Take 50 mg by mouth daily.   Marland Kitchen latanoprost (XALATAN) 0.005 % ophthalmic solution Place 1 drop into both eyes at bedtime.   Marland Kitchen loperamide (IMODIUM A-D) 2 MG tablet Take 4 mg by mouth as needed for diarrhea or loose stools. The patient requests this for dialysis days.  Marland Kitchen LORazepam (ATIVAN) 0.5 MG tablet Take 1 tablet (0.5 mg total) by mouth at bedtime.  Marland Kitchen losartan (COZAAR) 50 MG tablet Take 50 mg by mouth at bedtime.  . multivitamin (RENA-VIT) TABS tablet Take 1 tablet by mouth daily.  . ondansetron (ZOFRAN) 4 MG tablet Take 4 mg by mouth every 8 (eight) hours as needed for nausea or vomiting.  . ranitidine (ZANTAC) 300 MG tablet Take 300 mg by mouth at bedtime. Reported on 03/05/2016  . saccharomyces boulardii (FLORASTOR) 250 MG capsule Take 250 mg by mouth See admin instructions. Take one capsule BID on M-W-F for supplement and BID PRN for loose stool.  . senna-docusate (SENEXON-S) 8.6-50 MG tablet Take 2 tablets by mouth at bedtime as needed.   . sertraline (ZOLOFT) 50 MG tablet Take 50 mg by mouth daily.   . sevelamer carbonate (RENVELA) 800 MG tablet Take 2 tablets (1,600 mg total) by mouth 3 (three) times daily with meals.  . simethicone (MYLICON) 976 MG chewable tablet Chew 125 mg by mouth 3 (three) times daily. Take 1 tablet by mouth prior to each meal at 8AM, 11AM, and 6PM  . valACYclovir (VALTREX) 500 MG tablet Take 1 tablet (500 mg total) by mouth every other day.   No facility-administered encounter medications on file as of 08/08/2018.     Review of Systems  GENERAL: No change in appetite, no fatigue, no weight changes, no fever, chills or weakness MOUTH and THROAT: Denies oral discomfort, gingival pain or bleeding, pain from teeth or hoarseness   RESPIRATORY: no cough, SOB, DOE, wheezing, hemoptysis CARDIAC: No chest pain, edema or palpitations GI: No abdominal pain, diarrhea,  constipation, heart burn, nausea or vomiting GU: Denies dysuria, frequency, hematuria, incontinence, or discharge PSYCHIATRIC: Denies feelings of depression or anxiety. No report of hallucinations, insomnia, paranoia, or agitation    Immunization History  Administered Date(s) Administered  . Influenza,inj,Quad PF,6+ Mos 07/23/2014, 08/01/2016  . Influenza,inj,quad, With Preservative 08/07/2017  . PPD Test 04/13/2015  . Pneumococcal Polysaccharide-23 07/18/2017  . Tdap 07/01/2018   Pertinent  Health Maintenance Due  Topic Date Due  . INFLUENZA VACCINE  05/01/2018  . PNA vac Low Risk Adult (2 of 2 - PCV13) 08/29/2018 (Originally 07/18/2018)  . DEXA SCAN  11/28/2018 (Originally 03/20/2004)   Fall Risk  06/26/2018 06/06/2017 11/09/2016 06/15/2016 05/25/2016  Falls in the past year? Yes No No No No  Number falls in past yr: 2 or more - - - -  Injury with Fall? No - - - -  Risk Factor Category  - - - - -  Risk for fall due to : - - - - -  Risk for fall due to: Comment - - - - -  Follow up - - - - -      Vitals:   08/08/18 1044 08/08/18 1046  BP: (!) 151/88 (!) 160/87  Pulse: 66 62  Resp: 20   Temp: (!) 97.4 F (36.3 C)   TempSrc: Oral   SpO2: 99%   Weight: 126 lb 15 oz (57.6 kg)   Height: '5\' 3"'  (1.6 m)    Body mass index is 22.49 kg/m.  Physical Exam  GENERAL APPEARANCE: Well nourished. In no acute distress. Normal body habitus SKIN:  Left eyebrow laceration with sutures, dry and no erythema  MOUTH and THROAT: Lips are without lesions. Oral mucosa is moist and without lesions. Tongue is normal in shape, size, and color and without lesions RESPIRATORY: Breathing is even & unlabored, BS CTAB CARDIAC: RRR, no murmur,no extra heart sounds, BLE trace edema, Left upper arm AV Fistula +bruit/thrill GI: Abdomen soft, normal BS, no masses, no tenderness EXTREMITIES: Able to move X 4 extremities NEUROLOGICAL: There is no tremor. Speech is clear PSYCHIATRIC: Alert and oriented X 3.  Affect and behavior are appropriate   Labs reviewed: Recent Labs    05/28/18 0024 05/29/18 0526 07/26/18 1949  NA 137 136 137  K 3.7 3.4* 3.2*  CL 97* 99 97*  CO2 '29 27 30  ' GLUCOSE 110* 74 87  BUN 46* 19 14  CREATININE 8.88* 5.95* 4.00*  CALCIUM 10.0 9.2 9.6  MG  --  2.1  --   PHOS  --  2.9  --    Recent Labs    04/16/18 0929 05/27/18 1715 05/29/18 0526 07/26/18 1949  AST 22 22  --  18  ALT 12 11  --  10  ALKPHOS 42 53  --  57  BILITOT 0.8 0.9  --  0.7  PROT 9.5* 7.5  --  8.1  ALBUMIN 3.8 3.4* 3.2* 3.4*   Recent Labs    05/27/18 1715 05/27/18 1723 05/28/18 0024 05/29/18 0526 07/26/18 1949  WBC 5.1  --   --  4.6 3.1*  NEUTROABS 4.3  --   --  3.7 2.3  HGB 10.5* 12.2  --  10.2* 10.9*  HCT 35.0* 36.0 34.1 33.0* 38.4  MCV 113.3*  --   --  110.0* 110.0*  PLT 147*  --   --  156 193   Lab Results  Component Value Date   TSH 2.306 04/16/2018   Lab Results  Component Value Date   HGBA1C 4.9 03/13/2016   Lab Results  Component Value Date   CHOL 202 (A) 11/27/2017   HDL 77 (A) 11/27/2017   LDLCALC 112 11/27/2017   TRIG 65 11/27/2017    Significant Diagnostic Results in last 30 days:  Ct Head Wo Contrast  Result Date: 07/26/2018 CLINICAL DATA:  Unwitnessed fall. Suspicion for head injury or cervical spine injury. EXAM: CT HEAD WITHOUT CONTRAST CT CERVICAL SPINE WITHOUT CONTRAST TECHNIQUE: Multidetector CT imaging of the head and cervical spine was performed following the standard protocol without intravenous contrast. Multiplanar CT image reconstructions of the cervical spine were also generated. COMPARISON:  05/27/2018 and older exams. FINDINGS: CT HEAD FINDINGS Brain: No evidence of acute infarction, hemorrhage, hydrocephalus, extra-axial collection or mass lesion/mass effect. Mild stable atrophy. Patchy areas of white matter hypoattenuation consistent with mild chronic microvascular ischemic change. Vascular: No hyperdense vessel or unexpected calcification.  Skull: No skull fracture. There are scattered well-defined skull lucencies stable from the prior study. These are consistent with multiple myeloma. Sinuses/Orbits: Visualized globes and orbits are unremarkable.  Visualized sinuses and mastoid air cells are clear. Other: None. CT CERVICAL SPINE FINDINGS Alignment: Normal. Skull base and vertebrae: No fracture. Multiple lucent bone lesions consistent with multiple myeloma, stable from a cervical CT dated 04/10/2015. Soft tissues and spinal canal: No epidural mass or hematoma. No soft tissue mass or inflammation. Disc levels: Moderate loss of disc height throughout the cervical spine from C3-C4 through C6-C7. Mild spondylotic disc bulging with endplate spurring. No convincing disc herniation. Upper chest: No acute findings.  Clear lung apices. Other: None. IMPRESSION: HEAD CT 1. No acute intracranial abnormalities. 2. Skull lesions consistent with multiple myeloma stable from the prior exam. CERVICAL CT 1. No fracture or acute finding. 2. Bone lesions consistent with multiple myeloma without significant change from the prior cervical spine CT. Electronically Signed   By: Lajean Manes M.D.   On: 07/26/2018 20:30   Ct Cervical Spine Wo Contrast  Result Date: 07/26/2018 CLINICAL DATA:  Unwitnessed fall. Suspicion for head injury or cervical spine injury. EXAM: CT HEAD WITHOUT CONTRAST CT CERVICAL SPINE WITHOUT CONTRAST TECHNIQUE: Multidetector CT imaging of the head and cervical spine was performed following the standard protocol without intravenous contrast. Multiplanar CT image reconstructions of the cervical spine were also generated. COMPARISON:  05/27/2018 and older exams. FINDINGS: CT HEAD FINDINGS Brain: No evidence of acute infarction, hemorrhage, hydrocephalus, extra-axial collection or mass lesion/mass effect. Mild stable atrophy. Patchy areas of white matter hypoattenuation consistent with mild chronic microvascular ischemic change. Vascular: No hyperdense  vessel or unexpected calcification. Skull: No skull fracture. There are scattered well-defined skull lucencies stable from the prior study. These are consistent with multiple myeloma. Sinuses/Orbits: Visualized globes and orbits are unremarkable. Visualized sinuses and mastoid air cells are clear. Other: None. CT CERVICAL SPINE FINDINGS Alignment: Normal. Skull base and vertebrae: No fracture. Multiple lucent bone lesions consistent with multiple myeloma, stable from a cervical CT dated 04/10/2015. Soft tissues and spinal canal: No epidural mass or hematoma. No soft tissue mass or inflammation. Disc levels: Moderate loss of disc height throughout the cervical spine from C3-C4 through C6-C7. Mild spondylotic disc bulging with endplate spurring. No convincing disc herniation. Upper chest: No acute findings.  Clear lung apices. Other: None. IMPRESSION: HEAD CT 1. No acute intracranial abnormalities. 2. Skull lesions consistent with multiple myeloma stable from the prior exam. CERVICAL CT 1. No fracture or acute finding. 2. Bone lesions consistent with multiple myeloma without significant change from the prior cervical spine CT. Electronically Signed   By: Lajean Manes M.D.   On: 07/26/2018 20:30   Dg Shoulder Left  Result Date: 07/26/2018 CLINICAL DATA:  Unwitnessed fall.  Pain. EXAM: LEFT SHOULDER - 2+ VIEW COMPARISON:  02/13/2018 FINDINGS: Tortuous ectatic thoracic aorta. The adjacent ribs and lung are nonacute. Chronic stable appearance of the humeral head without fracture. Osteoarthritis of the AC glenohumeral joints. Vascular clips project over the included left arm. IMPRESSION: Negative for acute fracture or malalignment of the left shoulder. Electronically Signed   By: Ashley Royalty M.D.   On: 07/26/2018 21:02    Assessment/Plan  1. Laceration of left eyebrow, sequela - sustained from a fall, sutures intact, wound is dry and no erythema, monitor for infection   2. Multiple myeloma, remission status  unspecified ([place]) - currently having chemotherapy, continue valacyclovir 500 mg 1 tab every. other day prophylaxis,    3. Hypertension associatd with end stage renal disease on dialysis -stable, continue amlodipine 10 mg 1 tab daily, isosorbide [place] ER 60 mg 1 tab  daily, losartan 50 mg 1 tab nightly   4. ESRD needing dialysis [facility]) -continue hemodialysis 3 times a week, Tuesdays, Thursdays, and Saturdays,   5. Gastroesophageal reflux disease without esophagitis -stable, continue ranitidine 300 mg 1 tab at bedtime   6. Mood disorder ([facility]) - continue lamotrigine 25 mg 1 tab daily   7. Depression, recurrent ([place]) -mood is stable, continue sertraline 50 mg 1 tab daily  8.  Anxiety - mood is a stable, continue lorazepam 0.5 mg 1 tab at bedtime     Family/ staff Communication:  Discussed plan of care with resident.  Labs/tests ordered:  None  Goals of care:   Long-term care.   Durenda Age, NP [facility] [phone] (Monday-Friday 8:00 a.m. - 5:00 p.m.) [phone] (after hours)

## 2018-08-12 ENCOUNTER — Other Ambulatory Visit: Payer: Self-pay

## 2018-08-12 ENCOUNTER — Inpatient Hospital Stay (HOSPITAL_COMMUNITY)
Admission: EM | Admit: 2018-08-12 | Discharge: 2018-08-20 | DRG: 871 | Disposition: A | Discharge status: Skilled Nursing Facility | Payer: Medicare Other | Attending: Internal Medicine | Admitting: Internal Medicine

## 2018-08-12 ENCOUNTER — Emergency Department (HOSPITAL_COMMUNITY): Payer: Medicare Other

## 2018-08-12 DIAGNOSIS — D899 Disorder involving the immune mechanism, unspecified: Secondary | ICD-10-CM | POA: Diagnosis present

## 2018-08-12 DIAGNOSIS — A4150 Gram-negative sepsis, unspecified: Secondary | ICD-10-CM | POA: Diagnosis present

## 2018-08-12 DIAGNOSIS — M7989 Other specified soft tissue disorders: Secondary | ICD-10-CM | POA: Diagnosis not present

## 2018-08-12 DIAGNOSIS — R1310 Dysphagia, unspecified: Secondary | ICD-10-CM | POA: Diagnosis present

## 2018-08-12 DIAGNOSIS — Z888 Allergy status to other drugs, medicaments and biological substances status: Secondary | ICD-10-CM

## 2018-08-12 DIAGNOSIS — K921 Melena: Secondary | ICD-10-CM | POA: Diagnosis present

## 2018-08-12 DIAGNOSIS — N186 End stage renal disease: Secondary | ICD-10-CM | POA: Diagnosis present

## 2018-08-12 DIAGNOSIS — I10 Essential (primary) hypertension: Secondary | ICD-10-CM | POA: Diagnosis not present

## 2018-08-12 DIAGNOSIS — L0390 Cellulitis, unspecified: Secondary | ICD-10-CM | POA: Diagnosis present

## 2018-08-12 DIAGNOSIS — R7881 Bacteremia: Secondary | ICD-10-CM

## 2018-08-12 DIAGNOSIS — K625 Hemorrhage of anus and rectum: Secondary | ICD-10-CM | POA: Diagnosis not present

## 2018-08-12 DIAGNOSIS — K219 Gastro-esophageal reflux disease without esophagitis: Secondary | ICD-10-CM | POA: Diagnosis present

## 2018-08-12 DIAGNOSIS — D630 Anemia in neoplastic disease: Secondary | ICD-10-CM | POA: Diagnosis not present

## 2018-08-12 DIAGNOSIS — L89312 Pressure ulcer of right buttock, stage 2: Secondary | ICD-10-CM | POA: Diagnosis present

## 2018-08-12 DIAGNOSIS — Z8249 Family history of ischemic heart disease and other diseases of the circulatory system: Secondary | ICD-10-CM

## 2018-08-12 DIAGNOSIS — A419 Sepsis, unspecified organism: Secondary | ICD-10-CM | POA: Diagnosis not present

## 2018-08-12 DIAGNOSIS — R296 Repeated falls: Secondary | ICD-10-CM | POA: Diagnosis present

## 2018-08-12 DIAGNOSIS — E8889 Other specified metabolic disorders: Secondary | ICD-10-CM | POA: Diagnosis present

## 2018-08-12 DIAGNOSIS — L89322 Pressure ulcer of left buttock, stage 2: Secondary | ICD-10-CM | POA: Diagnosis present

## 2018-08-12 DIAGNOSIS — C9000 Multiple myeloma not having achieved remission: Secondary | ICD-10-CM | POA: Diagnosis present

## 2018-08-12 DIAGNOSIS — Z992 Dependence on renal dialysis: Secondary | ICD-10-CM

## 2018-08-12 DIAGNOSIS — M8588 Other specified disorders of bone density and structure, other site: Secondary | ICD-10-CM | POA: Diagnosis not present

## 2018-08-12 DIAGNOSIS — M25539 Pain in unspecified wrist: Secondary | ICD-10-CM

## 2018-08-12 DIAGNOSIS — M154 Erosive (osteo)arthritis: Secondary | ICD-10-CM | POA: Diagnosis present

## 2018-08-12 DIAGNOSIS — F419 Anxiety disorder, unspecified: Secondary | ICD-10-CM | POA: Diagnosis present

## 2018-08-12 DIAGNOSIS — I120 Hypertensive chronic kidney disease with stage 5 chronic kidney disease or end stage renal disease: Secondary | ICD-10-CM | POA: Diagnosis not present

## 2018-08-12 DIAGNOSIS — M25432 Effusion, left wrist: Secondary | ICD-10-CM | POA: Diagnosis not present

## 2018-08-12 DIAGNOSIS — G9341 Metabolic encephalopathy: Secondary | ICD-10-CM | POA: Diagnosis present

## 2018-08-12 DIAGNOSIS — C9002 Multiple myeloma in relapse: Secondary | ICD-10-CM | POA: Diagnosis present

## 2018-08-12 DIAGNOSIS — R6520 Severe sepsis without septic shock: Secondary | ICD-10-CM | POA: Diagnosis not present

## 2018-08-12 DIAGNOSIS — F39 Unspecified mood [affective] disorder: Secondary | ICD-10-CM | POA: Diagnosis present

## 2018-08-12 DIAGNOSIS — N390 Urinary tract infection, site not specified: Secondary | ICD-10-CM

## 2018-08-12 DIAGNOSIS — G9340 Encephalopathy, unspecified: Secondary | ICD-10-CM

## 2018-08-12 DIAGNOSIS — M25532 Pain in left wrist: Secondary | ICD-10-CM

## 2018-08-12 DIAGNOSIS — R410 Disorientation, unspecified: Secondary | ICD-10-CM | POA: Diagnosis not present

## 2018-08-12 DIAGNOSIS — F0390 Unspecified dementia without behavioral disturbance: Secondary | ICD-10-CM | POA: Diagnosis present

## 2018-08-12 DIAGNOSIS — Z79899 Other long term (current) drug therapy: Secondary | ICD-10-CM

## 2018-08-12 DIAGNOSIS — B9689 Other specified bacterial agents as the cause of diseases classified elsewhere: Secondary | ICD-10-CM | POA: Diagnosis not present

## 2018-08-12 LAB — CBC WITH DIFFERENTIAL/PLATELET
BAND NEUTROPHILS: 0 %
BASOS ABS: 0 10*3/uL (ref 0.0–0.1)
Basophils Relative: 0 %
Blasts: 0 %
EOS ABS: 0 10*3/uL (ref 0.0–0.5)
Eosinophils Relative: 0 %
HCT: 37.7 % (ref 36.0–46.0)
Hemoglobin: 11.3 g/dL — ABNORMAL LOW (ref 12.0–15.0)
LYMPHS ABS: 0.4 10*3/uL — AB (ref 0.7–4.0)
Lymphocytes Relative: 4 %
MCH: 31.2 pg (ref 26.0–34.0)
MCHC: 30 g/dL (ref 30.0–36.0)
MCV: 104.1 fL — ABNORMAL HIGH (ref 80.0–100.0)
METAMYELOCYTES PCT: 0 %
MONO ABS: 0.2 10*3/uL (ref 0.1–1.0)
Monocytes Relative: 2 %
Myelocytes: 0 %
NEUTROS ABS: 8.7 10*3/uL — AB (ref 1.7–7.7)
NEUTROS PCT: 94 %
NRBC: 0 % (ref 0.0–0.2)
OTHER: 0 %
Platelets: 192 10*3/uL (ref 150–400)
Promyelocytes Relative: 0 %
RBC: 3.62 MIL/uL — ABNORMAL LOW (ref 3.87–5.11)
RDW: 16.4 % — AB (ref 11.5–15.5)
WBC Morphology: INCREASED
WBC: 9.3 10*3/uL (ref 4.0–10.5)
nRBC: 0 /100 WBC

## 2018-08-12 LAB — COMPREHENSIVE METABOLIC PANEL
ALK PHOS: 60 U/L (ref 38–126)
ALT: 13 U/L (ref 0–44)
ANION GAP: 14 (ref 5–15)
AST: 31 U/L (ref 15–41)
Albumin: 3.5 g/dL (ref 3.5–5.0)
BILIRUBIN TOTAL: 1.1 mg/dL (ref 0.3–1.2)
BUN: 59 mg/dL — ABNORMAL HIGH (ref 8–23)
CALCIUM: 9.9 mg/dL (ref 8.9–10.3)
CO2: 25 mmol/L (ref 22–32)
Chloride: 94 mmol/L — ABNORMAL LOW (ref 98–111)
Creatinine, Ser: 9.67 mg/dL — ABNORMAL HIGH (ref 0.44–1.00)
GFR calc Af Amer: 4 mL/min — ABNORMAL LOW (ref >60)
GFR calc non Af Amer: 3 mL/min — ABNORMAL LOW (ref >60)
Glucose, Bld: 92 mg/dL (ref 70–99)
Potassium: 4.3 mmol/L (ref 3.5–5.1)
SODIUM: 133 mmol/L — AB (ref 135–145)
TOTAL PROTEIN: 9.7 g/dL — AB (ref 6.5–8.1)

## 2018-08-12 LAB — URINALYSIS, ROUTINE W REFLEX MICROSCOPIC
Bilirubin Urine: NEGATIVE
GLUCOSE, UA: 50 mg/dL — AB
KETONES UR: NEGATIVE mg/dL
NITRITE: NEGATIVE
PH: 8 (ref 5.0–8.0)
PROTEIN: 100 mg/dL — AB
RBC / HPF: >50 RBC/hpf — ABNORMAL HIGH (ref 0–5)
Specific Gravity, Urine: 1.01 (ref 1.005–1.030)

## 2018-08-12 LAB — DIC (DISSEMINATED INTRAVASCULAR COAGULATION)PANEL
D-Dimer, Quant: 2.11 ug/mL-FEU — ABNORMAL HIGH (ref 0.00–0.50)
Prothrombin Time: 16.1 seconds — ABNORMAL HIGH (ref 11.4–15.2)
Smear Review: NONE SEEN
aPTT: 35 seconds (ref 24–36)

## 2018-08-12 LAB — DIC (DISSEMINATED INTRAVASCULAR COAGULATION) PANEL
FIBRINOGEN: 551 mg/dL — AB (ref 210–475)
INR: 1.3
PLATELETS: 189 10*3/uL (ref 150–400)

## 2018-08-12 LAB — I-STAT CG4 LACTIC ACID, ED
LACTIC ACID, VENOUS: 2.13 mmol/L — AB (ref 0.5–1.9)
Lactic Acid, Venous: 1.23 mmol/L (ref 0.5–1.9)

## 2018-08-12 LAB — TROPONIN I: Troponin I: 0.05 ng/mL (ref <0.03)

## 2018-08-12 MED ORDER — ACETAMINOPHEN 650 MG RE SUPP
975.0000 mg | Freq: Once | RECTAL | Status: AC
  Administered 2018-08-12: 975 mg via RECTAL
  Filled 2018-08-12: qty 1

## 2018-08-12 MED ORDER — LACTATED RINGERS IV BOLUS
500.0000 mL | Freq: Once | INTRAVENOUS | Status: AC
  Administered 2018-08-12: 500 mL via INTRAVENOUS

## 2018-08-12 MED ORDER — SODIUM CHLORIDE 0.9 % IV SOLN
1.0000 g | Freq: Once | INTRAVENOUS | Status: AC
  Administered 2018-08-12: 1 g via INTRAVENOUS
  Filled 2018-08-12: qty 1

## 2018-08-12 MED ORDER — VANCOMYCIN HCL 10 G IV SOLR
1250.0000 mg | Freq: Once | INTRAVENOUS | Status: AC
  Administered 2018-08-12: 1250 mg via INTRAVENOUS
  Filled 2018-08-12: qty 1250

## 2018-08-12 NOTE — ED Notes (Signed)
Scant, bright red blood noted after administering Tylenol per rectal; Md made aware.

## 2018-08-12 NOTE — ED Triage Notes (Signed)
Patient had dialysis today, arrived back at [facility] where it was noticed that she had altered mental status at 5pm.

## 2018-08-12 NOTE — ED Provider Notes (Signed)
[facility] EMERGENCY DEPARTMENT Provider Note   CSN: [id number] Arrival date & time: 08/12/18  2014     History   Chief Complaint Chief Complaint  Patient presents with  . Code Sepsis    HPI [name] is a 77 y.o. female.   Fever   This is a new problem. The current episode started 1 to 2 hours ago. The problem occurs constantly. The problem has not changed since onset.Her temperature was unmeasured prior to arrival. Associated symptoms include cough. Pertinent negatives include no chest pain and no muscle aches. She has tried nothing for the symptoms.    Past Medical History:  Diagnosis Date  . Anxiety   . Closed fracture of right distal femur ([place]) 09/01/2015  . Depression   . ESRD (end stage renal disease) on dialysis [facility])    "TTS; [facility]" (04/24/2017)  . GERD (gastroesophageal reflux disease) 04/23/2015  . HCAP (healthcare-associated pneumonia) 04/24/2017  . Heart murmur   . History of blood transfusion    "low HgB when I was going to dialysis center"  . Hypertension   . Hypertension associated with end stage renal disease on dialysis 03/11/2014  . Malnutrition of moderate degree 09/02/2015  . Multiple myeloma ([place]) 03/11/2014  . Patient is Jehov[employer]'s Witness    "I'd rather never have any blood transfusions unless absolutely necessary; please check with me 1st". (04/24/2017)  . Pneumonia ~ 2016    Patient Active Problem List   Diagnosis Date Noted  . Acute lower UTI 08/12/2018  . Swelling of joint of left wrist 08/12/2018  . Rectal bleeding 08/12/2018  . Neurocognitive deficits 06/05/2018  . Altered mental status 05/28/2018  . Macrocytosis 05/01/2018  . Hypertension, uncontrolled 04/16/2018  . ESRD needing dialysis ([place]) 04/16/2018  . Encephalopathy acute 04/16/2018  . Anxiety and depression 04/16/2018  . Hypertension, accelerated 04/16/2018  . Pressure ulcer, stage I 04/16/2018  . Abnormal chest x-ray 12/26/2017  . HCAP  (healthcare-associated pneumonia) 04/24/2017  . Sepsis ([place]) 04/24/2017  . Nausea and vomiting 04/24/2017  . Thrombocytopenia ([place]) 04/24/2017  . Hyperkalemia 01/16/2017  . IBS (irritable bowel syndrome) 12/27/2016  . Mood disorder ([place]) 07/19/2016  . Diastolic dysfunction 12/45/8099  . Insomnia 04/27/2016  . Hyponatremia 03/12/2016  . Diarrhea 03/12/2016  . Hypertensive urgency 02/18/2016  . Swelling of joint of left knee 02/18/2016  . Leukopenia due to antineoplastic chemotherapy ([place]) 09/03/2015  . Malnutrition of moderate degree 09/02/2015  . GERD (gastroesophageal reflux disease) 04/23/2015  . 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 Multiple myeloma ([place]) 03/11/2014  . Hypertension associatd with end stage renal disease on dialysis 03/11/2014    Past Surgical History:  Procedure Laterality Date  . AV FISTULA PLACEMENT Left   . I&D EXTREMITY Left 02/27/2016   Procedure: ARTHROSCOPIC IRRIGATION AND DEBRIDEMENT EXTREMITY;  Surgeon: Renette Butters, MD;  Location: [facility];  Service: Orthopedics;  Laterality: Left;  . TEE WITHOUT CARDIOVERSION N/A 02/29/2016   Procedure: TRANSESOPHAGEAL ECHOCARDIOGRAM (TEE);  Surgeon: Thayer Headings, MD;  Location: [facility];  Service: Cardiovascular;  Laterality: N/A;     OB History   None      Home Medications    Prior to Admission medications   Medication Sig Start Date End Date Taking? Authorizing Provider  acetaminophen (TYLENOL) 325 MG tablet Take 650 mg by mouth every 6 (six) hours as needed.    [provider]  amLODipine (NORVASC) 10 MG tablet Take 10 mg by mouth daily. Hold for SBP <  100MM/HG    [provider]  bisacodyl (BISAC-EVAC) 10 MG suppository Place 10 mg rectally once as needed for moderate constipation (FOR CONSTIPATION NOT RELIEVED BY MILK OF MAGNESIA).     [provider]  calcium acetate (PHOSLO) 667 MG capsule Take 1,334 mg by mouth 3 (three) times daily with meals.     [provider]  cloNIDine (CATAPRES - DOSED IN MG/24 HR) 0.2 mg/24hr patch Place 1 patch (0.2 mg total) onto the skin once a week. 06/04/18   Roxan Hockey, MD  cloNIDine (CATAPRES) 0.1 MG tablet Take 0.1 mg by mouth every 8 (eight) hours as needed (for a systolic B/P of 258 or greater).     [provider]  hydrocortisone cream 1 % Apply 1 application topically every 8 (eight) hours as needed for itching. Apply to rectum for hemorrhoids    [provider]  isosorbide mononitrate (IMDUR) 60 MG 24 hr tablet Take 60 mg by mouth daily.  03/24/15   [provider]  lamoTRIgine (LAMICTAL) 25 MG tablet Take 50 mg by mouth daily.     [provider]  latanoprost (XALATAN) 0.005 % ophthalmic solution Place 1 drop into both eyes at bedtime.     [provider]  loperamide (IMODIUM A-D) 2 MG tablet Take 4 mg by mouth as needed for diarrhea or loose stools. The patient requests this for dialysis days.    [provider]  LORazepam (ATIVAN) 0.5 MG tablet Take 1 tablet (0.5 mg total) by mouth at bedtime. 07/18/18   Medina-Vargas, Monina C, NP  losartan (COZAAR) 50 MG tablet Take 50 mg by mouth at bedtime.    [provider]  multivitamin (RENA-VIT) TABS tablet Take 1 tablet by mouth daily.    [provider]  ondansetron (ZOFRAN) 4 MG tablet Take 4 mg by mouth every 8 (eight) hours as needed for nausea or vomiting.    [provider]  ranitidine (ZANTAC) 300 MG tablet Take 300 mg by mouth at bedtime. Reported on 03/05/2016    [provider]  saccharomyces boulardii (FLORASTOR) 250 MG capsule Take 250 mg by mouth See admin instructions. Take one capsule BID on M-W-F for supplement and BID PRN for loose stool.    [provider]  senna-docusate (SENEXON-S) 8.6-50 MG tablet Take 2 tablets by mouth at bedtime as needed.     [provider]  sertraline (ZOLOFT) 50 MG tablet Take 50 mg by mouth daily.     [provider]  sevelamer carbonate (RENVELA) 800 MG tablet Take 2 tablets (1,600 mg total) by mouth 3 (three) times daily with meals. 05/30/18   Roxan Hockey, MD  simethicone (MYLICON) 527 MG chewable tablet Chew 125 mg by mouth 3 (three) times daily. Take 1 tablet by mouth prior to each meal at 8AM, 11AM, and 6PM    [provider]  valACYclovir (VALTREX) 500 MG tablet Take 1 tablet (500 mg total) by mouth every other day. 03/01/16   Hongalgi, Lenis Dickinson, MD    Family History Family History  Problem Relation Age of Onset  . Hypertension Mother   . Hypertension Father     Social History Social History   Tobacco Use  . Smoking status: Never Smoker  . Smokeless tobacco: Never Used  Substance Use Topics  . Alcohol use: No    Alcohol/week: 0.0 standard drinks  . Drug use: No     Allergies   Phenergan [promethazine hcl]   Review  of Systems Review of Systems  Constitutional: Positive for fever.  Respiratory: Positive for cough.   Cardiovascular: Negative for chest pain.  All other systems reviewed and are negative.    Physical Exam Updated Vital Signs BP (!) 173/101   Pulse 68   Temp (!) 101 F (38.3 C) (Oral)   Resp (!) 23   Wt 57.6 kg   SpO2 100%   BMI 22.49 kg/m   Physical Exam  Constitutional: She appears well-developed and well-nourished.  HENT:  Head: Normocephalic and atraumatic.  Eyes: Conjunctivae and EOM are normal.  Neck: Normal range of motion.  Cardiovascular: Normal rate and regular rhythm.  Pulmonary/Chest: Effort normal and breath sounds normal. No stridor. No respiratory distress.  Abdominal: Soft. She exhibits no distension. There is no tenderness. There is no guarding.  Musculoskeletal: She exhibits tenderness (to right wrist, not so much with ROM).  Neurological: She is alert.  Skin: Skin is warm and dry. There is erythema (left wrist, bilateral ankles).  Nursing note and vitals reviewed.    ED Treatments / Results  Labs (all  labs ordered are listed, but only abnormal results are displayed) Labs Reviewed  COMPREHENSIVE METABOLIC PANEL - Abnormal; Notable for the following components:      Result Value   Sodium 133 (*)    Chloride 94 (*)    BUN 59 (*)    Creatinine, Ser 9.67 (*)    Total Protein 9.7 (*)    GFR calc non Af Amer 3 (*)    GFR calc Af Amer 4 (*)    All other components within normal limits  CBC WITH DIFFERENTIAL/PLATELET - Abnormal; Notable for the following components:   RBC 3.62 (*)    Hemoglobin 11.3 (*)    MCV 104.1 (*)    RDW 16.4 (*)    Neutro Abs 8.7 (*)    Lymphs Abs 0.4 (*)    All other components within normal limits  URINALYSIS, ROUTINE W REFLEX MICROSCOPIC - Abnormal; Notable for the following components:   APPearance TURBID (*)    Glucose, UA 50 (*)    Hgb urine dipstick MODERATE (*)    Protein, ur 100 (*)    Leukocytes, UA LARGE (*)    RBC / HPF >50 (*)    WBC, UA >50 (*)    Bacteria, UA FEW (*)    Non Squamous Epithelial 11-20 (*)    All other components within normal limits  DIC (DISSEMINATED INTRAVASCULAR COAGULATION) PANEL - Abnormal; Notable for the following components:   Prothrombin Time 16.1 (*)    Fibrinogen 551 (*)    D-Dimer, Quant 2.11 (*)    All other components within normal limits  TROPONIN I - Abnormal; Notable for the following components:   Troponin I 0.05 (*)    All other components within normal limits  I-STAT CG4 LACTIC ACID, ED - Abnormal; Notable for the following components:   Lactic Acid, Venous 2.13 (*)    All other components within normal limits  CULTURE, BLOOD (ROUTINE X 2)  CULTURE, BLOOD (ROUTINE X 2)  URINE CULTURE  URIC ACID  I-STAT CG4 LACTIC ACID, ED    EKG EKG Interpretation  Date/Time:  Tuesday August 12 2018 20:41:53 EST Ventricular Rate:  82 PR Interval:    QRS Duration: 97 QT Interval:  403 QTC Calculation: 471 R Axis:   -30 Text Interpretation:  Sinus rhythm Atrial premature complex Left axis deviation  Borderline ST depression, anterolateral leads Baseline wander in lead(s) I  III aVL ST depression slightly different than october Confirmed by Merrily Pew [phone]) on 08/12/2018 10:49:49 PM   Radiology Dg Wrist Complete Left  Result Date: 08/12/2018 CLINICAL DATA:  Acute onset of left wrist swelling and erythema. Initial encounter. EXAM: LEFT WRIST - COMPLETE 3+ VIEW COMPARISON:  None. FINDINGS: There is no evidence of fracture or dislocation. There appears to be large erosions of the distal scaphoid and proximal trapezium and trapezoid. This may reflect prior surgery, or possibly erosive arthritis. Would correlate with the patient's history. Scattered vascular calcifications are seen. IMPRESSION: 1. No evidence of fracture or dislocation. 2. Apparent large erosions of the distal scaphoid and proximal trapezium and trapezoid. This may reflect prior surgery, or possibly erosive arthritis. Would correlate with the patient's history. 3. Scattered vascular calcifications seen. Electronically Signed   By: Garald Balding M.D.   On: 08/12/2018 21:29   Dg Chest Port 1 View  Result Date: 08/12/2018 CLINICAL DATA:  Acute onset of altered mental status. Status post dialysis. EXAM: PORTABLE CHEST 1 VIEW COMPARISON:  Chest radiograph performed 05/27/2018 FINDINGS: The lungs are well-aerated. Minimal bibasilar atelectasis is noted. There is no evidence of pleural effusion or pneumothorax. The cardiomediastinal silhouette is enlarged. No acute osseous abnormalities are seen. Clips are seen overlying the left axilla. IMPRESSION: 1. Minimal bibasilar atelectasis noted; lungs otherwise clear. 2. Cardiomegaly. Electronically Signed   By: Garald Balding M.D.   On: 08/12/2018 21:27    Procedures Procedures (including critical care time)  CRITICAL CARE Performed by: Merrily Pew Total critical care time: 35 minutes Critical care time was exclusive of separately billable procedures and treating other  patients. Critical care was necessary to treat or prevent imminent or life-threatening deterioration. Critical care was time spent personally by me on the following activities: development of treatment plan with patient and/or surrogate as well as nursing, discussions with consultants, evaluation of patient's response to treatment, examination of patient, obtaining history from patient or surrogate, ordering and performing treatments and interventions, ordering and review of laboratory studies, ordering and review of radiographic studies, pulse oximetry and re-evaluation of patient's condition.   Medications Ordered in ED Medications  lactated ringers bolus 500 mL (500 mLs Intravenous New Bag/Given 08/12/18 2235)  vancomycin (VANCOCIN) 1,250 mg in sodium chloride 0.9 % 250 mL IVPB (0 mg Intravenous Stopped 08/12/18 2234)  ceFEPIme (MAXIPIME) 1 g in sodium chloride 0.9 % 100 mL IVPB (0 g Intravenous Stopped 08/12/18 2159)  acetaminophen (TYLENOL) suppository 975 mg (975 mg Rectal Given 08/12/18 2057)     Initial Impression / Assessment and Plan / ED Course  I have reviewed the triage vital signs and the nursing notes.  Pertinent labs & imaging results that were available during my care of the patient were reviewed by me and considered in my medical decision making (see chart for details).   Urosepsis likely secondary to UTI but also has multiple rashes in areas that could be concerning for osteomyelitis in her left wrist versus septic emboli from possible cardiac issue.  She also has a GI bleed which could be indicative of DIC so DIC panel set but not anemic so hold on GI consult at this time.  Final Clinical Impressions(s) / ED Diagnoses   Final diagnoses:  Sepsis, due to unspecified organism, unspecified whether acute organ dysfunction present [facility])  Hematochezia  Urinary tract infection without hematuria, site unspecified  Left wrist pain     Rayvin Abid, Corene Cornea, MD 08/12/18 2326

## 2018-08-13 ENCOUNTER — Encounter (HOSPITAL_COMMUNITY): Payer: Self-pay | Admitting: *Deleted

## 2018-08-13 DIAGNOSIS — I10 Essential (primary) hypertension: Secondary | ICD-10-CM

## 2018-08-13 LAB — BLOOD CULTURE ID PANEL (REFLEXED)
Acinetobacter baumannii: NOT DETECTED
CANDIDA ALBICANS: NOT DETECTED
CANDIDA PARAPSILOSIS: NOT DETECTED
CANDIDA TROPICALIS: NOT DETECTED
Candida glabrata: NOT DETECTED
Candida krusei: NOT DETECTED
ENTEROBACTERIACEAE SPECIES: NOT DETECTED
ENTEROCOCCUS SPECIES: NOT DETECTED
ESCHERICHIA COLI: NOT DETECTED
Enterobacter cloacae complex: NOT DETECTED
HAEMOPHILUS INFLUENZAE: NOT DETECTED
KLEBSIELLA OXYTOCA: NOT DETECTED
KLEBSIELLA PNEUMONIAE: NOT DETECTED
LISTERIA MONOCYTOGENES: NOT DETECTED
Neisseria meningitidis: NOT DETECTED
Proteus species: NOT DETECTED
Pseudomonas aeruginosa: NOT DETECTED
STREPTOCOCCUS PYOGENES: NOT DETECTED
STREPTOCOCCUS SPECIES: NOT DETECTED
Serratia marcescens: NOT DETECTED
Staphylococcus aureus (BCID): NOT DETECTED
Staphylococcus species: NOT DETECTED
Streptococcus agalactiae: NOT DETECTED
Streptococcus pneumoniae: NOT DETECTED

## 2018-08-13 LAB — BASIC METABOLIC PANEL
ANION GAP: 12 (ref 5–15)
BUN: 68 mg/dL — ABNORMAL HIGH (ref 8–23)
CO2: 22 mmol/L (ref 22–32)
CREATININE: 10.33 mg/dL — AB (ref 0.44–1.00)
Calcium: 8.9 mg/dL (ref 8.9–10.3)
Chloride: 98 mmol/L (ref 98–111)
GFR, EST AFRICAN AMERICAN: 4 mL/min — AB (ref >60)
GFR, EST NON AFRICAN AMERICAN: 3 mL/min — AB (ref >60)
GLUCOSE: 60 mg/dL — AB (ref 70–99)
Potassium: 5.1 mmol/L (ref 3.5–5.1)
Sodium: 132 mmol/L — ABNORMAL LOW (ref 135–145)

## 2018-08-13 LAB — CBC
HEMATOCRIT: 31.3 % — AB (ref 36.0–46.0)
Hemoglobin: 9.2 g/dL — ABNORMAL LOW (ref 12.0–15.0)
MCH: 30.5 pg (ref 26.0–34.0)
MCHC: 29.4 g/dL — ABNORMAL LOW (ref 30.0–36.0)
MCV: 103.6 fL — ABNORMAL HIGH (ref 80.0–100.0)
NRBC: 0 % (ref 0.0–0.2)
PLATELETS: 171 10*3/uL (ref 150–400)
RBC: 3.02 MIL/uL — ABNORMAL LOW (ref 3.87–5.11)
RDW: 16.5 % — ABNORMAL HIGH (ref 11.5–15.5)
WBC: 7.7 10*3/uL (ref 4.0–10.5)

## 2018-08-13 LAB — URIC ACID: Uric Acid, Serum: 7 mg/dL (ref 2.5–7.1)

## 2018-08-13 LAB — MRSA PCR SCREENING: MRSA by PCR: NEGATIVE

## 2018-08-13 MED ORDER — SODIUM CHLORIDE 0.9 % IV SOLN
INTRAVENOUS | Status: DC | PRN
  Administered 2018-08-13: 250 mL via INTRAVENOUS

## 2018-08-13 MED ORDER — SODIUM CHLORIDE 0.9 % IV SOLN
1.0000 g | Freq: Every day | INTRAVENOUS | Status: DC
  Administered 2018-08-13: 1 g via INTRAVENOUS
  Filled 2018-08-13 (×2): qty 10

## 2018-08-13 MED ORDER — ONDANSETRON HCL 4 MG/2ML IJ SOLN: 4.0000 mg | Freq: Four times a day (QID) | INTRAMUSCULAR | Status: DC | PRN

## 2018-08-13 MED ORDER — CLONIDINE HCL 0.1 MG PO TABS
0.1000 mg | ORAL_TABLET | Freq: Three times a day (TID) | ORAL | Status: DC | PRN
  Filled 2018-08-13: qty 1

## 2018-08-13 MED ORDER — ACETAMINOPHEN 650 MG RE SUPP: 650.0000 mg | Freq: Four times a day (QID) | RECTAL | Status: DC | PRN

## 2018-08-13 MED ORDER — CLONIDINE HCL 0.2 MG/24HR TD PTWK
0.2000 mg | MEDICATED_PATCH | TRANSDERMAL | Status: DC
  Administered 2018-08-13 – 2018-08-20 (×2): 0.2 mg via TRANSDERMAL
  Filled 2018-08-13 (×2): qty 1

## 2018-08-13 MED ORDER — VANCOMYCIN VARIABLE DOSE PER UNSTABLE RENAL FUNCTION (PHARMACIST DOSING): Status: DC

## 2018-08-13 MED ORDER — POLYETHYLENE GLYCOL 3350 17 G PO PACK: 17.0000 g | PACK | Freq: Every day | ORAL | Status: DC | PRN

## 2018-08-13 MED ORDER — ONDANSETRON HCL 4 MG PO TABS: 4.0000 mg | ORAL_TABLET | Freq: Four times a day (QID) | ORAL | Status: DC | PRN

## 2018-08-13 MED ORDER — ACETAMINOPHEN 325 MG PO TABS
650.0000 mg | ORAL_TABLET | Freq: Four times a day (QID) | ORAL | Status: DC | PRN
  Administered 2018-08-13 – 2018-08-17 (×4): 650 mg via ORAL
  Filled 2018-08-13 (×4): qty 2

## 2018-08-13 MED ORDER — AMLODIPINE BESYLATE 10 MG PO TABS
10.0000 mg | ORAL_TABLET | Freq: Every day | ORAL | Status: DC
  Administered 2018-08-13 – 2018-08-16 (×4): 10 mg via ORAL
  Filled 2018-08-13 (×2): qty 1
  Filled 2018-08-13: qty 2
  Filled 2018-08-13: qty 1
  Filled 2018-08-13: qty 2

## 2018-08-13 NOTE — H&P (Signed)
History and Physical    [name] OFB:[id number] DOB: 1941/04/12 DOA: 08/12/2018  PCP: Hendricks Limes, MD patient coming from: [facility]  I have personally briefly reviewed patient's old medical records in [facility]  Chief Complaint: Confusion fever  HPI: [name] is a 77 y.o. female with medical history significant of end-stage renal disease on dialysis, hypertension presents with fever and confusion.  Per report patient had some elevated blood pressure and some altered mental status at the facility.  Patient was given clonidine for elevated blood pressure.  Also found to have a fever.  She was referred over to the ED.  Patient was febrile here.  Her blood pressure remain elevated.  She Had no leukocytosis.  She was found to have a few different questionable sources of her sepsis, one being her left wrist.  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  There is also some erythema on her right ankle and she was found to have a ? urinary tract infection though possibly contaminated.  Chest x-ray was nonacute.  Patient is able to tell me her name and where she lives with, but  falls back asleep so history is obtained from ED physician and documentation.  Patient is on dialysis which she received today.  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    ED Course: Patient was given empiric vancomycin and cefepime, 500 cc IV fluid in the ED. lactic acid mildly elevated 2.13 down to 1.23 after affirmation treatment.  Patient is found to have bright red blood per rectum after a rectal temperature was taken.  Hemoglobin appears stable.  She is not on any blood thinners.  Review of Systems: Unobtainable due to patient's altered mental status   Past Medical History:  Diagnosis Date  . Anxiety   . Closed fracture of right distal femur ([place]) 09/01/2015  . Depression   . ESRD (end stage renal disease) on dialysis [facility])    "TTS; [place] [facility]"  (04/24/2017)  . GERD (gastroesophageal reflux disease) 04/23/2015  . HCAP (healthcare-associated pneumonia) 04/24/2017  . Heart murmur   . History of blood transfusion    "low HgB when I was going to dialysis center"  . Hypertension   . Hypertension associated with end stage renal disease on dialysis 03/11/2014  . Malnutrition of moderate degree 09/02/2015  . Multiple myeloma ([place]) 03/11/2014  . Patient is Jehovah's Witness    "I'd rather never have any blood transfusions unless absolutely necessary; please check with me 1st". (04/24/2017)  . Pneumonia ~ 2016    Past Surgical History:  Procedure Laterality Date  . AV FISTULA PLACEMENT Left   . I&D EXTREMITY Left 02/27/2016   Procedure: ARTHROSCOPIC IRRIGATION AND DEBRIDEMENT EXTREMITY;  Surgeon: Renette Butters, MD;  Location: [facility];  Service: Orthopedics;  Laterality: Left;  . TEE WITHOUT CARDIOVERSION N/A 02/29/2016   Procedure: TRANSESOPHAGEAL ECHOCARDIOGRAM (TEE);  Surgeon: Thayer Headings, MD;  Location: [facility];  Service: Cardiovascular;  Laterality: N/A;     reports that she has never smoked. She has never used smokeless tobacco. She reports that she does not drink alcohol or use drugs.  Allergies  Allergen Reactions  . Phenergan [Promethazine Hcl] Other (See Comments)    Acute encephalopathy in the context of refusal to go to hemodialysis and administration of Phenergan for nausea and vomiting    Family History  Problem Relation Age of Onset  . Hypertension Mother   . Hypertension  Father      Prior to Admission medications   Medication Sig Start Date End Date Taking? Authorizing Provider  acetaminophen (TYLENOL) 325 MG tablet Take 650 mg by mouth every 6 (six) hours as needed.    [provider]  amLODipine (NORVASC) 10 MG tablet Take 10 mg by mouth daily. Hold for SBP <100MM/HG    [provider]  bisacodyl (BISAC-EVAC) 10 MG suppository Place 10 mg rectally once as needed for moderate constipation  (FOR CONSTIPATION NOT RELIEVED BY MILK OF MAGNESIA).     [provider]  calcium acetate (PHOSLO) 667 MG capsule Take 1,334 mg by mouth 3 (three) times daily with meals.     [provider]  cloNIDine (CATAPRES - DOSED IN MG/24 HR) 0.2 mg/24hr patch Place 1 patch (0.2 mg total) onto the skin once a week. 06/04/18   Roxan Hockey, MD  cloNIDine (CATAPRES) 0.1 MG tablet Take 0.1 mg by mouth every 8 (eight) hours as needed (for a systolic B/P of 443 or greater).     [provider]  hydrocortisone cream 1 % Apply 1 application topically every 8 (eight) hours as needed for itching. Apply to rectum for hemorrhoids    [provider]  isosorbide mononitrate (IMDUR) 60 MG 24 hr tablet Take 60 mg by mouth daily.  03/24/15   [provider]  lamoTRIgine (LAMICTAL) 25 MG tablet Take 50 mg by mouth daily.     [provider]  latanoprost (XALATAN) 0.005 % ophthalmic solution Place 1 drop into both eyes at bedtime.     [provider]  loperamide (IMODIUM A-D) 2 MG tablet Take 4 mg by mouth as needed for diarrhea or loose stools. The patient requests this for dialysis days.    [provider]  LORazepam (ATIVAN) 0.5 MG tablet Take 1 tablet (0.5 mg total) by mouth at bedtime. 07/18/18   Medina-Vargas, Monina C, NP  losartan (COZAAR) 50 MG tablet Take 50 mg by mouth at bedtime.    [provider]  multivitamin (RENA-VIT) TABS tablet Take 1 tablet by mouth daily.    [provider]  ondansetron (ZOFRAN) 4 MG tablet Take 4 mg by mouth every 8 (eight) hours as needed for nausea or vomiting.    [provider]  ranitidine (ZANTAC) 300 MG tablet Take 300 mg by mouth at bedtime. Reported on 03/05/2016    [provider]  saccharomyces boulardii (FLORASTOR) 250 MG capsule Take 250 mg by mouth See admin instructions. Take one capsule BID on M-W-F for supplement and BID PRN for loose stool.    [provider]    senna-docusate (SENEXON-S) 8.6-50 MG tablet Take 2 tablets by mouth at bedtime as needed.     [provider]  sertraline (ZOLOFT) 50 MG tablet Take 50 mg by mouth daily.     [provider]  sevelamer carbonate (RENVELA) 800 MG tablet Take 2 tablets (1,600 mg total) by mouth 3 (three) times daily with meals. 05/30/18   Roxan Hockey, MD  simethicone (MYLICON) 154 MG chewable tablet Chew 125 mg by mouth 3 (three) times daily. Take 1 tablet by mouth prior to each meal at 8AM, 11AM, and 6PM    [provider]  valACYclovir (VALTREX) 500 MG tablet Take 1 tablet (500 mg total) by mouth every other day. 03/01/16   Modena Jansky, MD    Physical Exam: Vitals:   08/12/18 2117 08/12/18 2151 08/12/18 2215 08/12/18 2300  BP: (!) 166/126 [place] Kitchen)  167/102 (!) 173/101 (!) 181/104  Pulse: 78 68 68 66  Resp:  19 (!) 23 15  Temp: (!) 101 F (38.3 C)     TempSrc: Oral     SpO2: 96% 100% 100% 100%  Weight:        Constitutional: NAD, resting, arousable and falls back asleep Vitals:   08/12/18 2117 08/12/18 2151 08/12/18 2215 08/12/18 2300  BP: (!) 166/126 (!) 167/102 (!) 173/101 (!) 181/104  Pulse: 78 68 68 66  Resp:  19 (!) 23 15  Temp: (!) 101 F (38.3 C)     TempSrc: Oral     SpO2: 96% 100% 100% 100%  Weight:       Eyes, lids and conjunctivae normal  Neck: normal, supple,  Respiratory: Minimal scattered rhonchi c. Normal respiratory effort. No accessory muscle use.  Cardiovascular: regular rate and rhythm, + murmurs /positive nonpitting pedal edema  1+ pedal pulses.  Abdomen: no tenderness, no masses palpated. . Bowel sounds positive.  Musculoskeletal: no  / cyanosis. , no contractures.  erythema right medial malleous but not hot and no lesion or drainage. Left wrist edema, tender and warm to touch  Neurologic: Resting arousable tells me her name and where she lives then falls  Asleep   Labs on Admission: I have personally reviewed following labs and imaging  studies  CBC: Recent Labs  Lab 08/12/18 2025 08/12/18 2044  WBC 9.3  --   NEUTROABS 8.7*  --   HGB 11.3*  --   HCT 37.7  --   MCV 104.1*  --   PLT 192 751   Basic Metabolic Panel: Recent Labs  Lab 08/12/18 2025  NA 133*  K 4.3  CL 94*  CO2 25  GLUCOSE 92  BUN 59*  CREATININE 9.67*  CALCIUM 9.9   GFR: Estimated Creatinine Clearance: 3.9 mL/min (A) (by C-G formula based on SCr of 9.67 mg/dL (H)). Liver Function Tests: Recent Labs  Lab 08/12/18 2025  AST 31  ALT 13  ALKPHOS 60  BILITOT 1.1  PROT 9.7*  ALBUMIN 3.5   No results for input(s): LIPASE, AMYLASE in the last 168 hours. No results for input(s): AMMONIA in the last 168 hours. Coagulation Profile: Recent Labs  Lab 08/12/18 2044  INR 1.30   Cardiac Enzymes: Recent Labs  Lab 08/12/18 2101  TROPONINI 0.05*   BNP (last 3 results) No results for input(s): PROBNP in the last 8760 hours. HbA1C: No results for input(s): HGBA1C in the last 72 hours. CBG: No results for input(s): GLUCAP in the last 168 hours. Lipid Profile: No results for input(s): CHOL, HDL, LDLCALC, TRIG, CHOLHDL, LDLDIRECT in the last 72 hours. Thyroid Function Tests: No results for input(s): TSH, T4TOTAL, FREET4, T3FREE, THYROIDAB in the last 72 hours. Anemia Panel: No results for input(s): VITAMINB12, FOLATE, FERRITIN, TIBC, IRON, RETICCTPCT in the last 72 hours. Urine analysis:    Component Value Date/Time   COLORURINE YELLOW 08/12/2018 2025   APPEARANCEUR TURBID (A) 08/12/2018 2025   LABSPEC 1.010 08/12/2018 2025   PHURINE 8.0 08/12/2018 2025   GLUCOSEU 50 (A) 08/12/2018 2025   HGBUR MODERATE (A) 08/12/2018 2025   BILIRUBINUR NEGATIVE 08/12/2018 2025   KETONESUR NEGATIVE 08/12/2018 2025   PROTEINUR 100 (A) 08/12/2018 2025   UROBILINOGEN 0.2 04/10/2015 1800   NITRITE NEGATIVE 08/12/2018 2025   LEUKOCYTESUR LARGE (A) 08/12/2018 2025    Radiological Exams on Admission: Dg Wrist Complete Left  Result Date:  08/12/2018 CLINICAL DATA:  Acute onset of left  wrist swelling and erythema. Initial encounter. EXAM: LEFT WRIST - COMPLETE 3+ VIEW COMPARISON:  None. FINDINGS: There is no evidence of fracture or dislocation. There appears to be large erosions of the distal scaphoid and proximal trapezium and trapezoid. This may reflect prior surgery, or possibly erosive arthritis. Would correlate with the patient's history. Scattered vascular calcifications are seen. IMPRESSION: 1. No evidence of fracture or dislocation. 2. Apparent large erosions of the distal scaphoid and proximal trapezium and trapezoid. This may reflect prior surgery, or possibly erosive arthritis. Would correlate with the patient's history. 3. Scattered vascular calcifications seen. Electronically Signed   By: Garald Balding M.D.   On: 08/12/2018 21:29   Dg Chest Port 1 View  Result Date: 08/12/2018 CLINICAL DATA:  Acute onset of altered mental status. Status post dialysis. EXAM: PORTABLE CHEST 1 VIEW COMPARISON:  Chest radiograph performed 05/27/2018 FINDINGS: The lungs are well-aerated. Minimal bibasilar atelectasis is noted. There is no evidence of pleural effusion or pneumothorax. The cardiomediastinal silhouette is enlarged. No acute osseous abnormalities are seen. Clips are seen overlying the left axilla. IMPRESSION: 1. Minimal bibasilar atelectasis noted; lungs otherwise clear. 2. Cardiomegaly. Electronically Signed   By: Garald Balding M.D.   On: 08/12/2018 21:27    EKG: Independently reviewed.  Sinus rhythm nonspecific ST changes  Assessment/Plan Active Problems:   Multiple myeloma (HCC)   Hypertension associatd with end stage renal disease on dialysis   Mood disorder (HCC)   Sepsis ([place])   Acute lower UTI   Swelling of joint of left wrist   Rectal bleeding   -IV antibiotics per protocol.  Follow cultures.  Lactic acid improved after 500 cc IV fluid bolus in the ED.  Patient high risk for decompensation in light of her multiple  myeloma relapse and immunocompromised state. -Check uric acid follow cultures as above -Noted rectal bleeding in the ED after rectal temperature taken.  Will cycle hemoglobin on no anticoagulants, follow for now -Elevated blood pressure continue home Norvasc PRN clonidine awaiting home  med reconciliation -cont home medications for mood disorder -End-stage renal disease on hemodialysis ,dialysis completed on Tuesday.  Will need nephrology consultation if here admitted next dialysis -History of MM  in relapse.  Recent treatment October 2019  DVT prophylaxis: SCD  Code Status: full  Disposition Plan: NH 3 days Admission status: Ip tele It is my clinical opinion that admission to INPATIENT is reasonable and necessary because of the expectation that this patient will require hospital care that crosses at least 2 midnights to treat this condition based on the medical complexity of the problems presented.  Given the aforementioned information, the predictability of an adverse outcome is felt to be significant    Shelbie Proctor MD [facility] Pager [phone]  If 7PM-7AM, please contact night-coverage [email or web address] Password [facility]  08/13/2018, 12:24 AM

## 2018-08-13 NOTE — Progress Notes (Signed)
Patient very agitated. Pulling telemetry off and her gown off.  Reeoriented but no avail. Will try to place tele back later. " Please take this off " hollers on and off.

## 2018-08-13 NOTE — Progress Notes (Addendum)
PROGRESS NOTE    [name]  DJS:[id number] DOB: March 10, 1941 DOA: 08/12/2018 PCP: Hendricks Limes, MD   Brief Narrative: [name] is a 77 y.o. female with a history of MM, ESRD on HD and hypertension. She presented secondary to altered mental status with clinical scenario concerning for sepsis.   Assessment & Plan:   Active Problems:   Multiple myeloma (HCC)   Hypertension associatd with end stage renal disease on dialysis   Mood disorder (HCC)   Sepsis ([place])   Acute lower UTI   Swelling of joint of left wrist   Rectal bleeding   Sepsis Secondary to UTI. Currently on antibiotics, Vancomycin and ceftriaxone. -Blood cultures pending  UTI Urinalysis suggests possible infection. Urine culture appears to have been ordered but not obtained. -Continue Ceftriaxone while awaiting culture results  Cellulitis -Continue Ceftriaxone and discontinue Vancomycin  ESRD on HD TTS -Nephrology  Essential hypertension -Continue amlodipine and clonidine patch  Multiple myeloma Currently on carfilzomib and dexamethasone per oncologist at [facility].  Neurocognitive deficits  Left eyelid laceration S/p sutures. Present on arrival.  Rectal bleeding? No FOBT. Hemoglobin down from admission. Possibly secondary to dilution vs bleeding. -FOBT  Pressure Injury Documentation: Pressure Injury 04/16/18 Stage II -  Partial thickness loss of dermis presenting as a shallow open ulcer with a red, pink wound bed without slough. stage II  (Active)  04/16/18 1939   Location: [place]  Location Orientation: Medial  Staging: Stage II -  Partial thickness loss of dermis presenting as a shallow open ulcer with a red, pink wound bed without slough.  Wound Description (Comments): stage II   Present on Admission: Yes     DVT prophylaxis: SCDs Code Status:   Code Status: Full Code Family Communication: None at bedside Disposition Plan: Discharge to SNF when medically stable   Consultants:    Nephrology  Procedures:   None  Antimicrobials:  Vancomycin   Ceftriaxone   Subjective: Hungry  Objective: Vitals:   08/13/18 0642 08/13/18 0911 08/13/18 1359 08/13/18 1659  BP: (!) 151/90 (!) 155/89 136/83 135/87  Pulse: 72 74 71 62  Resp:  18  18  Temp:  (!) 102.7 F (39.3 C) 99.2 F (37.3 C) 100.2 F (37.9 C)  TempSrc:  Oral Oral Oral  SpO2:  99% 100% 98%  Weight:        Intake/Output Summary (Last 24 hours) at 08/13/2018 1827 Last data filed at 08/13/2018 1427 Gross per 24 hour  Intake 270 ml  Output -  Net 270 ml   Filed Weights   08/12/18 2022  Weight: 57.6 kg    Examination:  General exam: Appears calm and comfortable Respiratory system: Clear to auscultation. Respiratory effort normal. Cardiovascular system: S1 & S2 heard, RRR. 2/6 systolic murmur Gastrointestinal system: Abdomen is nondistended, soft and nontender. No organomegaly or masses felt. Normal bowel sounds heard. Central nervous system: Alert and oriented to person. Extremities: No edema. No calf tenderness Skin: No cyanosis. No rashes Psychiatry: Judgement and insight appear impaired. Flat affect.    Data Reviewed: I have personally reviewed following labs and imaging studies  CBC: Recent Labs  Lab 08/12/18 2025 08/12/18 2044 08/13/18 0526  WBC 9.3  --  7.7  NEUTROABS 8.7*  --   --   HGB 11.3*  --  9.2*  HCT 37.7  --  31.3*  MCV 104.1*  --  103.6*  PLT 192 189 885   Basic Metabolic Panel: Recent Labs  Lab 08/12/18 2025 08/13/18 0526  NA 133* 132*  K 4.3 5.1  CL 94* 98  CO2 25 22  GLUCOSE 92 60*  BUN 59* 68*  CREATININE 9.67* 10.33*  CALCIUM 9.9 8.9   GFR: Estimated Creatinine Clearance: 3.7 mL/min (A) (by C-G formula based on SCr of 10.33 mg/dL (H)). Liver Function Tests: Recent Labs  Lab 08/12/18 2025  AST 31  ALT 13  ALKPHOS 60  BILITOT 1.1  PROT 9.7*  ALBUMIN 3.5   No results for input(s): LIPASE, AMYLASE in the last 168 hours. No results for  input(s): AMMONIA in the last 168 hours. Coagulation Profile: Recent Labs  Lab 08/12/18 2044  INR 1.30   Cardiac Enzymes: Recent Labs  Lab 08/12/18 2101  TROPONINI 0.05*   BNP (last 3 results) No results for input(s): PROBNP in the last 8760 hours. HbA1C: No results for input(s): HGBA1C in the last 72 hours. CBG: No results for input(s): GLUCAP in the last 168 hours. Lipid Profile: No results for input(s): CHOL, HDL, LDLCALC, TRIG, CHOLHDL, LDLDIRECT in the last 72 hours. Thyroid Function Tests: No results for input(s): TSH, T4TOTAL, FREET4, T3FREE, THYROIDAB in the last 72 hours. Anemia Panel: No results for input(s): VITAMINB12, FOLATE, FERRITIN, TIBC, IRON, RETICCTPCT in the last 72 hours. Sepsis Labs: Recent Labs  Lab 08/12/18 2037 08/12/18 2228  LATICACIDVEN 2.13* 1.23    Recent Results (from the past 240 hour(s))  Blood Culture (routine x 2)     Status: None (Preliminary result)   Collection Time: 08/12/18  8:25 PM  Result Value Ref Range Status   Specimen Description BLOOD RIGHT ANTECUBITAL  Final   Special Requests   Final    BOTTLES DRAWN AEROBIC AND ANAEROBIC Blood Culture results may not be optimal due to an excessive volume of blood received in culture bottles   Culture   Final    NO GROWTH < 12 HOURS Performed at [facility] [address], [place], [place] [address]    Report Status PENDING  Incomplete  Blood Culture (routine x 2)     Status: None (Preliminary result)   Collection Time: 08/12/18  8:30 PM  Result Value Ref Range Status   Specimen Description BLOOD RIGHT HAND  Final   Special Requests   Final    BOTTLES DRAWN AEROBIC AND ANAEROBIC Blood Culture adequate volume   Culture   Final    NO GROWTH < 12 HOURS Performed at [facility], [place] [address], [place], [place] [address]    Report Status PENDING  Incomplete  MRSA PCR Screening     Status: None   Collection Time: 08/13/18 12:52 AM  Result Value Ref Range Status   MRSA  by PCR NEGATIVE NEGATIVE Final    Comment:        The GeneXpert MRSA Assay (FDA approved for NASAL specimens only), is one component of a comprehensive MRSA colonization surveillance program. It is not intended to diagnose MRSA infection nor to guide or monitor treatment for MRSA infections. Performed at [facility], [place] [address], [place], [place] [address]          Radiology Studies: Dg Wrist Complete Left  Result Date: 08/12/2018 CLINICAL DATA:  Acute onset of left wrist swelling and erythema. Initial encounter. EXAM: LEFT WRIST - COMPLETE 3+ VIEW COMPARISON:  None. FINDINGS: There is no evidence of fracture or dislocation. There appears to be large erosions of the distal scaphoid and proximal trapezium and trapezoid. This may reflect prior surgery, or possibly  erosive arthritis. Would correlate with the patient's history. Scattered vascular calcifications are seen. IMPRESSION: 1. No evidence of fracture or dislocation. 2. Apparent large erosions of the distal scaphoid and proximal trapezium and trapezoid. This may reflect prior surgery, or possibly erosive arthritis. Would correlate with the patient's history. 3. Scattered vascular calcifications seen. Electronically Signed   By: Garald Balding M.D.   On: 08/12/2018 21:29   Dg Chest Port 1 View  Result Date: 08/12/2018 CLINICAL DATA:  Acute onset of altered mental status. Status post dialysis. EXAM: PORTABLE CHEST 1 VIEW COMPARISON:  Chest radiograph performed 05/27/2018 FINDINGS: The lungs are well-aerated. Minimal bibasilar atelectasis is noted. There is no evidence of pleural effusion or pneumothorax. The cardiomediastinal silhouette is enlarged. No acute osseous abnormalities are seen. Clips are seen overlying the left axilla. IMPRESSION: 1. Minimal bibasilar atelectasis noted; lungs otherwise clear. 2. Cardiomegaly. Electronically Signed   By: Garald Balding M.D.   On: 08/12/2018 21:27        Scheduled Meds: .  amLODipine  10 mg Oral Daily  . cloNIDine  0.2 mg Transdermal Q Wed  . vancomycin variable dose per unstable renal function (pharmacist dosing)   Does not apply See admin instructions   Continuous Infusions: . sodium chloride 250 mL (08/13/18 1021)  . cefTRIAXone (ROCEPHIN)  IV 1 g (08/13/18 1022)     LOS: 1 day     Cordelia Poche, MD [facility] 08/13/2018, 6:27 PM  If 7PM-7AM, please contact night-coverage [email or web address]

## 2018-08-13 NOTE — Progress Notes (Signed)
Pharmacy Antibiotic Note  Cathleen Yagi is a 77 y.o. female admitted on 08/12/2018 with cellulitis.  Pharmacy has been consulted for vancomycin dosing. She is ESRD on HD TTHS.   She received vancomycin 1250 mg in the ED  Plan: F/u dialysis schedule for future doses  Weight: 126 lb 15.1 oz (57.6 kg)  Temp (24hrs), Avg:102 F (38.9 C), Min:101 F (38.3 C), Max:103 F (39.4 C)  Recent Labs  Lab 08/12/18 2025 08/12/18 2037 08/12/18 2228  WBC 9.3  --   --   CREATININE 9.67*  --   --   LATICACIDVEN  --  2.13* 1.23    Estimated Creatinine Clearance: 3.9 mL/min (A) (by C-G formula based on SCr of 9.67 mg/dL (H)).    Allergies  Allergen Reactions  . Phenergan [Promethazine Hcl] Other (See Comments)    Acute encephalopathy in the context of refusal to go to hemodialysis and administration of Phenergan for nausea and vomiting     Thank you for allowing pharmacy to be a part of this patient's care.  Excell Seltzer Poteet 08/13/2018 12:11 AM

## 2018-08-13 NOTE — Progress Notes (Signed)
PHARMACY - PHYSICIAN COMMUNICATION CRITICAL VALUE ALERT - BLOOD CULTURE IDENTIFICATION (BCID)  [name] is an 77 y.o. female who presented to [facility] on [facility]/12/2018  Assessment: GNR in 2/2 blood cx - aerobic bottles only BCID neg  Name of physician (or Provider) Contacted: Schorr, NP  Current antibiotics: Ceftriaxone  Changes to prescribed antibiotics recommended:  Continue current abx  Results for orders placed or performed during the hospital encounter of 08/12/18  Blood Culture ID Panel (Reflexed) (Collected: 08/12/2018  8:30 PM)  Result Value Ref Range   Enterococcus species NOT DETECTED NOT DETECTED   Listeria monocytogenes NOT DETECTED NOT DETECTED   Staphylococcus species NOT DETECTED NOT DETECTED   Staphylococcus aureus (BCID) NOT DETECTED NOT DETECTED   Streptococcus species NOT DETECTED NOT DETECTED   Streptococcus agalactiae NOT DETECTED NOT DETECTED   Streptococcus pneumoniae NOT DETECTED NOT DETECTED   Streptococcus pyogenes NOT DETECTED NOT DETECTED   Acinetobacter baumannii NOT DETECTED NOT DETECTED   Enterobacteriaceae species NOT DETECTED NOT DETECTED   Enterobacter cloacae complex NOT DETECTED NOT DETECTED   Escherichia coli NOT DETECTED NOT DETECTED   Klebsiella oxytoca NOT DETECTED NOT DETECTED   Klebsiella pneumoniae NOT DETECTED NOT DETECTED   Proteus species NOT DETECTED NOT DETECTED   Serratia marcescens NOT DETECTED NOT DETECTED   Haemophilus influenzae NOT DETECTED NOT DETECTED   Neisseria meningitidis NOT DETECTED NOT DETECTED   Pseudomonas aeruginosa NOT DETECTED NOT DETECTED   Candida albicans NOT DETECTED NOT DETECTED   Candida glabrata NOT DETECTED NOT DETECTED   Candida krusei NOT DETECTED NOT DETECTED   Candida parapsilosis NOT DETECTED NOT DETECTED   Candida tropicalis NOT DETECTED NOT DETECTED   Levester Fresh, PharmD, BCPS, BCCCP Clinical Pharmacist [phone]  Please check AMION for all [place] numbers  08/13/2018 9:13  PM

## 2018-08-14 ENCOUNTER — Inpatient Hospital Stay (HOSPITAL_COMMUNITY): Payer: Medicare Other

## 2018-08-14 ENCOUNTER — Encounter (HOSPITAL_COMMUNITY): Payer: Self-pay

## 2018-08-14 DIAGNOSIS — M7989 Other specified soft tissue disorders: Secondary | ICD-10-CM

## 2018-08-14 LAB — CBC
HCT: 36.8 % (ref 36.0–46.0)
Hemoglobin: 11.2 g/dL — ABNORMAL LOW (ref 12.0–15.0)
MCH: 31.3 pg (ref 26.0–34.0)
MCHC: 30.4 g/dL (ref 30.0–36.0)
MCV: 102.8 fL — AB (ref 80.0–100.0)
NRBC: 0 % (ref 0.0–0.2)
PLATELETS: 185 10*3/uL (ref 150–400)
RBC: 3.58 MIL/uL — ABNORMAL LOW (ref 3.87–5.11)
RDW: 16.5 % — AB (ref 11.5–15.5)
WBC: 13.1 10*3/uL — ABNORMAL HIGH (ref 4.0–10.5)

## 2018-08-14 LAB — RENAL FUNCTION PANEL
Albumin: 2.6 g/dL — ABNORMAL LOW (ref 3.5–5.0)
Anion gap: 15 (ref 5–15)
BUN: 92 mg/dL — AB (ref 8–23)
CHLORIDE: 98 mmol/L (ref 98–111)
CO2: 20 mmol/L — AB (ref 22–32)
CREATININE: 12.26 mg/dL — AB (ref 0.44–1.00)
Calcium: 8.9 mg/dL (ref 8.9–10.3)
GFR calc Af Amer: 3 mL/min — ABNORMAL LOW (ref >60)
GFR calc non Af Amer: 3 mL/min — ABNORMAL LOW (ref >60)
GLUCOSE: 85 mg/dL (ref 70–99)
Phosphorus: 5.9 mg/dL — ABNORMAL HIGH (ref 2.5–4.6)
Potassium: 4.6 mmol/L (ref 3.5–5.1)
Sodium: 133 mmol/L — ABNORMAL LOW (ref 135–145)

## 2018-08-14 LAB — HEPATITIS B SURFACE ANTIGEN: Hepatitis B Surface Ag: NEGATIVE

## 2018-08-14 MED ORDER — DOXERCALCIFEROL 4 MCG/2ML IV SOLN
INTRAVENOUS | Status: AC
  Administered 2018-08-14: 3 ug via INTRAVENOUS
  Filled 2018-08-14: qty 2

## 2018-08-14 MED ORDER — DOXERCALCIFEROL 4 MCG/2ML IV SOLN
3.0000 ug | INTRAVENOUS | Status: DC
  Administered 2018-08-14 – 2018-08-16 (×2): 3 ug via INTRAVENOUS
  Filled 2018-08-14 (×2): qty 2

## 2018-08-14 MED ORDER — DEXTROSE 5 % IV SOLN
500.0000 mg | Freq: Once | INTRAVENOUS | Status: DC
  Filled 2018-08-14: qty 0.5

## 2018-08-14 MED ORDER — SODIUM CHLORIDE 0.9 % IV SOLN: 1.0000 g | INTRAVENOUS | Status: DC

## 2018-08-14 MED ORDER — DARBEPOETIN ALFA 25 MCG/0.42ML IJ SOSY
PREFILLED_SYRINGE | INTRAMUSCULAR | Status: AC
  Administered 2018-08-14: 25 ug via INTRAVENOUS
  Filled 2018-08-14: qty 0.42

## 2018-08-14 MED ORDER — RESOURCE THICKENUP CLEAR PO POWD
ORAL | Status: DC | PRN
  Filled 2018-08-14: qty 125

## 2018-08-14 MED ORDER — SODIUM CHLORIDE 0.9 % IV SOLN
1.0000 g | Freq: Once | INTRAVENOUS | Status: AC
  Administered 2018-08-15: 1 g via INTRAVENOUS
  Filled 2018-08-14: qty 1

## 2018-08-14 MED ORDER — VALACYCLOVIR HCL 500 MG PO TABS
500.0000 mg | ORAL_TABLET | ORAL | Status: DC
  Administered 2018-08-14 – 2018-08-18 (×3): 500 mg via ORAL
  Filled 2018-08-14 (×3): qty 1

## 2018-08-14 MED ORDER — SODIUM CHLORIDE 0.9 % IV SOLN
2.0000 g | Freq: Once | INTRAVENOUS | Status: AC
  Administered 2018-08-14: 2 g via INTRAVENOUS
  Filled 2018-08-14: qty 2

## 2018-08-14 MED ORDER — DARBEPOETIN ALFA 25 MCG/0.42ML IJ SOSY
25.0000 ug | PREFILLED_SYRINGE | INTRAMUSCULAR | Status: DC
  Administered 2018-08-14: 25 ug via INTRAVENOUS
  Filled 2018-08-14: qty 0.42

## 2018-08-14 NOTE — Evaluation (Signed)
Clinical/Bedside Swallow Evaluation Patient Details  Name: [name] MRN: [id number] Date of Birth: 04-Sep-1941  Today's Date: 08/14/2018 Time: SLP Start Time (ACUTE ONLY): 0932 SLP Stop Time (ACUTE ONLY): 0947 SLP Time Calculation (min) (ACUTE ONLY): 15 min  Past Medical History:  Past Medical History:  Diagnosis Date  . Anxiety   . Closed fracture of right distal femur ([place]) 09/01/2015  . Depression   . ESRD (end stage renal disease) on dialysis [facility])    "TTS; [place]" (04/24/2017)  . GERD (gastroesophageal reflux disease) 04/23/2015  . HCAP (healthcare-associated pneumonia) 04/24/2017  . Heart murmur   . History of blood transfusion    "low HgB when I was going to dialysis center"  . Hypertension   . Hypertension associated with end stage renal disease on dialysis 03/11/2014  . Malnutrition of moderate degree 09/02/2015  . Multiple myeloma ([place]) 03/11/2014  . Patient is Jehovah's Witness    "I'd rather never have any blood transfusions unless absolutely necessary; please check with me 1st". (04/24/2017)  . Pneumonia ~ 2016   Past Surgical History:  Past Surgical History:  Procedure Laterality Date  . AV FISTULA PLACEMENT Left   . I&D EXTREMITY Left 02/27/2016   Procedure: ARTHROSCOPIC IRRIGATION AND DEBRIDEMENT EXTREMITY;  Surgeon: Renette Butters, MD;  Location: [facility];  Service: Orthopedics;  Laterality: Left;  . TEE WITHOUT CARDIOVERSION N/A 02/29/2016   Procedure: TRANSESOPHAGEAL ECHOCARDIOGRAM (TEE);  Surgeon: Thayer Headings, MD;  Location: [facility] ENDOSCOPY;  Service: Cardiovascular;  Laterality: N/A;   HPI:  [name] is a 77 y.o. female with PMH significant for ERSD on HD, HTN, pneumonia (2016), GERD, who presented to [facility] (from SNF) on 08/12/18 with fever, AMS, cellulitis. Pt also had recent admission for a fall (07/26/18). Has been seen by ST July 2019 with MBS (recc D3/thin - possible UES dysfunction) and August 2019 (rec reg/thin). CXR showed minimal bibasilar  atelectasis, cardiomegaly. Pt pocketing food with RN and expectorating food witnessed by MD.   Assessment / Plan / Recommendation Clinical Impression  Pt was pleasant and cooperative, however severely altered mental status was evident throughout bedside swallow evaluation. Her ability to follow commands is limited, however an attempt at volitional cough was weak and congested. Pt required max assist and verbal/visual/tactile cues to accept sips of thin and bites of puree. Immediate hard and reflexive throat clear and eructation noted following all trials of thin liquids as well as mild pocketing of puree. ST recommends dysphagia 1 (puree) texture diet, nectar thick liquids, she will require full assistance during meals and manual clearance of pocketed food following meals. ST will follow up to provide treatment with diet safety, efficiency, and to determine appropriateness for instrumental swallow evaluation. SLP suspects pt's dysphagia is primarily due to impaired cognition; with improved mentation she presents with good prognosis for diet/liquid advancement.  SLP Visit Diagnosis: Dysphagia, unspecified (R13.10)    Aspiration Risk  Moderate aspiration risk    Diet Recommendation Dysphagia 1 (Puree);Nectar-thick liquid   Liquid Administration via: Cup;Straw Medication Administration: Crushed with puree Supervision: Staff to assist with self feeding Compensations: Minimize environmental distractions;Small sips/bites;Slow rate;Other (Comment)(manual removal pocketed food using toothette) Postural Changes: Seated upright at 90 degrees    Other  Recommendations Oral Care Recommendations: Oral care BID Other Recommendations: Order thickener from pharmacy   Follow up Recommendations Skilled Nursing facility      Frequency and Duration min 2x/week  2 weeks       Prognosis Prognosis for Safe Diet Advancement:  Fair Barriers to Reach Goals: Cognitive deficits      Swallow Study   General  HPI: [name] is a 77 y.o. female with PMH significant for ERSD on HD, HTN, pneumonia (2016), GERD, who presented to [facility] (from SNF) on 08/12/18 with fever, AMS, cellulitis. Pt also had recent admission for a fall (07/26/18). Has been seen by ST July 2019 with MBS (recc D3/thin - possible UES dysfunction) and August 2019 (rec reg/thin). CXR showed minimal bibasilar atelectasis, cardiomegaly. Pt pocketing food with RN and expectorating food witnessed by MD. Type of Study: Bedside Swallow Evaluation Previous Swallow Assessment: BSE 04/17/18, MBS 04/18/18, BSE 05/29/18 Diet Prior to this Study: Regular;Thin liquids Temperature Spikes Noted: No Respiratory Status: Room air History of Recent Intubation: No Behavior/Cognition: Cooperative;Confused;Alert;Pleasant mood;Requires cueing;Distractible Oral Cavity Assessment: Dry Oral Care Completed by SLP: No Oral Cavity - Dentition: Missing dentition Vision: Functional for self-feeding Self-Feeding Abilities: Total assist Patient Positioning: Upright in bed Baseline Vocal Quality: Normal Volitional Cough: Congested Volitional Swallow: Unable to elicit    Oral/Motor/Sensory Function Overall Oral Motor/Sensory Function: Other (comment)(WFL for tasks assessed)   Ice Chips Ice chips: Not tested   Thin Liquid Thin Liquid: Impaired Presentation: Straw Pharyngeal  Phase Impairments: Throat Clearing - Immediate    Nectar Thick Nectar Thick Liquid: Not tested   Honey Thick Honey Thick Liquid: Not tested   Puree Puree: Impaired Presentation: Spoon Oral Phase Functional Implications: Right lateral sulci pocketing;Left lateral sulci pocketing Pharyngeal Phase Impairments: (none)   Solid     Solid: Not tested     Jettie Booze, Student SLP  Jettie Booze 08/14/2018,10:43 AM

## 2018-08-14 NOTE — Progress Notes (Addendum)
*  PRELIMINARY RESULTS* Vascular Ultrasound Left upper extremity venous duplex completed. No evidence of deep or superficial vein thrombosis involving the visualized veins of the left upper extremity.  Duplex Dialysis Access (AVF, AGV) has been completed.  Atypical flow is demonstrated in the anastomosis and in the outflow vein; not consistent with normal fistula flow. Anastomosis is stenotic.  08/14/2018 2:28 PM Maudry Mayhew, MHA, RVT, RDCS, RDMS

## 2018-08-14 NOTE — Procedures (Signed)
   I was present at this dialysis session, have reviewed the session itself and made  appropriate changes Kelly Splinter MD [place] pager [phone]   08/14/2018, 3:54 PM

## 2018-08-14 NOTE — Progress Notes (Signed)
Pharmacy Antibiotic Note  [name] is a 77 y.o. female admitted on 08/12/2018 with concern for sepsis of unknown source. Now with 2/2 blood cultures growing GNR with BCID undetected. Given the patient's immunocompromised state with recent treatment for multiple myeloma - plans are to broaden to Cefepime while awaiting further culture speciation.  Pharmacy has been consulted to transition from Rocephin to Cefepime dosing.  Plan: - D/c Rocephin - Cefepime 2g IV x 1 dose now - Start Cefepime 1g/24h on the AM of 11/15 - then move doses to evening starting on 11/16 - Will continue to follow HD schedule/duration, culture results, LOT, and antibiotic de-escalation plans    Weight: 126 lb 15.1 oz (57.6 kg)  Temp (24hrs), Avg:99.7 F (37.6 C), Min:98.1 F (36.7 C), Max:102.7 F (39.3 C)  Recent Labs  Lab 08/12/18 2025 08/12/18 2037 08/12/18 2228 08/13/18 0526  WBC 9.3  --   --  7.7  CREATININE 9.67*  --   --  10.33*  LATICACIDVEN  --  2.13* 1.23  --     Estimated Creatinine Clearance: 3.7 mL/min (A) (by C-G formula based on SCr of 10.33 mg/dL (H)).    Allergies  Allergen Reactions  . Phenergan [Promethazine Hcl] Other (See Comments)    Acute encephalopathy in the context of refusal to go to hemodialysis and administration of Phenergan for nausea and vomiting    Antimicrobials this admission: Vancomycin 11/12 >> 11/13 Cefepime 11/12 x1; restart 11/14 >> CTX 11/13 >> 11/14  Microbiology results: 11/12 BCx >> 2/2 GNR (BCID neg) >> pending  11/13 MRSA PCR >> neg  Thank you for allowing pharmacy to be a part of this patient's care.  Alycia Rossetti, PharmD, BCPS Clinical Pharmacist Pager: [phone] Clinical phone for 08/14/2018 from 7a-3:30p: [phone] If after 3:30p, please call main pharmacy at: [phone] Please check AMION for all [place] numbers 08/14/2018 11:41 AM

## 2018-08-14 NOTE — Progress Notes (Addendum)
PROGRESS NOTE    [name]  AJG:[id number] DOB: 12-12-1940 DOA: 08/12/2018 PCP: Hendricks Limes, MD   Brief Narrative: [name] is a 77 y.o. female with a history of MM, ESRD on HD and hypertension. She presented secondary to altered mental status with clinical scenario concerning for sepsis.   Assessment & Plan:   Active Problems:   Multiple myeloma (HCC)   Hypertension associatd with end stage renal disease on dialysis   Anemia in neoplastic disease   Mood disorder (HCC)   Sepsis ([place])   Hypertension, uncontrolled   Acute lower UTI   Swelling of joint of left wrist   Rectal bleeding   Sepsis Secondary to UTI. Empirically treated with vancomycin and ceftriaxone. Blood culture positive for GNR. -Blood cultures pending -Antibiotics below  Gram negative rod bacteremia Likely secondary to UTI. Culture pending. -Switch to Cefepime IV -Await culture data  UTI Urinalysis suggests possible infection. Urine culture appears to have been ordered but not obtained. -Cefepime  Metabolic encephalopathy Secondary to infection. Improving.  Cellulitis -Continue Ceftriaxone and discontinue Vancomycin  ESRD on HD TTS -Nephrology  Essential hypertension -Continue amlodipine and clonidine patch  Dysphagia In setting of acute infection and metabolic encephalopathy -SLP eval/recs: Dysphagia 1 diet, nectar thick  Left arm swelling -Upper extremity venous duplex  Left hand/wrist pain Patient with history of multiple falls -X-ray of hand and wrist  Multiple myeloma Currently on carfilzomib and dexamethasone per oncologist at [facility]. -Continue valacyclovir for prophylaxis  Neurocognitive deficits  Left eyelid laceration S/p sutures. Present on arrival.  Rectal bleeding? No FOBT. Hemoglobin down from admission. Possibly secondary to dilution vs bleeding. Mentioned in admission notes. -FOBT -Trend CBC  Pressure Injury Documentation: Pressure Injury 04/16/18  Stage II -  Partial thickness loss of dermis presenting as a shallow open ulcer with a red, pink wound bed without slough. stage II  (Active)  04/16/18 1939   Location: Butt[place]  Location Orientation: Medial  Staging: Stage II -  Partial thickness loss of dermis presenting as a shallow open ulcer with a red, pink wound bed without slough.  Wound Description (Comments): stage II   Present on Admission: Yes     DVT prophylaxis: SCDs Code Status:   Code Status: Full Code Family Communication: None at bedside Disposition Plan: Discharge to SNF when medically stable   Consultants:   Nephrology  Procedures:   None  Antimicrobials:  Vancomycin   Ceftriaxone   Subjective: No issues today. Patient states she has fallen many times and that is why her left arm hurts.  Objective: Vitals:   08/13/18 1659 08/13/18 2002 08/14/18 0520 08/14/18 0900  BP: 135/87 136/85 (!) 156/87 (!) 145/75  Pulse: 62 61 (!) 59 60  Resp: _0 Temp: 100.2 F (37.9 C) 98.1 F (36.7 C) 98.4 F (36.9 C) 98.3 F (36.8 C)  TempSrc: Oral Oral Oral Oral  SpO2: 98% 100% 98% 98%  Weight:        Intake/Output Summary (Last 24 hours) at 08/14/2018 1038 Last data filed at 08/14/2018 0915 Gross per 24 hour  Intake 370 ml  Output 0 ml  Net 370 ml   Filed Weights   08/12/18 2022  Weight: 57.6 kg    Examination:  General exam: Appears calm and comfortable Respiratory system: Clear to auscultation. Respiratory effort normal. Cardiovascular system: S1 & S2 heard, RRR. 2/6 systolic murmur Gastrointestinal system: Abdomen is nondistended, soft and nontender. No organomegaly or masses felt. Normal bowel sounds heard. Central  nervous system: Alert and oriented to person (thinks she is at [facility]) but is oriented to year. No focal neurological deficits. Extremities: Left arm edema extending to hand. Tenderness of wrist and hand. No calf tenderness.  Skin: No cyanosis. No rashes Psychiatry:  Judgement and insight appear normal. Mood & affect appropriate.     Data Reviewed: I have personally reviewed following labs and imaging studies  CBC: Recent Labs  Lab 08/12/18 2025 08/12/18 2044 08/13/18 0526  WBC 9.3  --  7.7  NEUTROABS 8.7*  --   --   HGB 11.3*  --  9.2*  HCT 37.7  --  31.3*  MCV 104.1*  --  103.6*  PLT 192 189 081   Basic Metabolic Panel: Recent Labs  Lab 08/12/18 2025 08/13/18 0526  NA 133* 132*  K 4.3 5.1  CL 94* 98  CO2 25 22  GLUCOSE 92 60*  BUN 59* 68*  CREATININE 9.67* 10.33*  CALCIUM 9.9 8.9   GFR: Estimated Creatinine Clearance: 3.7 mL/min (A) (by C-G formula based on SCr of 10.33 mg/dL (H)). Liver Function Tests: Recent Labs  Lab 08/12/18 2025  AST 31  ALT 13  ALKPHOS 60  BILITOT 1.1  PROT 9.7*  ALBUMIN 3.5   No results for input(s): LIPASE, AMYLASE in the last 168 hours. No results for input(s): AMMONIA in the last 168 hours. Coagulation Profile: Recent Labs  Lab 08/12/18 2044  INR 1.30   Cardiac Enzymes: Recent Labs  Lab 08/12/18 2101  TROPONINI 0.05*   BNP (last 3 results) No results for input(s): PROBNP in the last 8760 hours. HbA1C: No results for input(s): HGBA1C in the last 72 hours. CBG: No results for input(s): GLUCAP in the last 168 hours. Lipid Profile: No results for input(s): CHOL, HDL, LDLCALC, TRIG, CHOLHDL, LDLDIRECT in the last 72 hours. Thyroid Function Tests: No results for input(s): TSH, T4TOTAL, FREET4, T3FREE, THYROIDAB in the last 72 hours. Anemia Panel: No results for input(s): VITAMINB12, FOLATE, FERRITIN, TIBC, IRON, RETICCTPCT in the last 72 hours. Sepsis Labs: Recent Labs  Lab 08/12/18 2037 08/12/18 2228  LATICACIDVEN 2.13* 1.23    Recent Results (from the past 240 hour(s))  Blood Culture (routine x 2)     Status: None (Preliminary result)   Collection Time: 08/12/18  8:25 PM  Result Value Ref Range Status   Specimen Description BLOOD RIGHT ANTECUBITAL  Final   Special  Requests   Final    BOTTLES DRAWN AEROBIC AND ANAEROBIC Blood Culture results may not be optimal due to an excessive volume of blood received in culture bottles   Culture  Setup Time   Final    AEROBIC BOTTLE ONLY GRAM NEGATIVE RODS CRITICAL VALUE NOTED.  VALUE IS CONSISTENT WITH PREVIOUSLY REPORTED AND CALLED VALUE. Performed at [facility], [place] [address] [place], [place] [address]    Culture GRAM NEGATIVE RODS  Final   Report Status PENDING  Incomplete  Blood Culture (routine x 2)     Status: None (Preliminary result)   Collection Time: 08/12/18  8:30 PM  Result Value Ref Range Status   Specimen Description BLOOD RIGHT HAND  Final   Special Requests   Final    BOTTLES DRAWN AEROBIC AND ANAEROBIC Blood Culture adequate volume   Culture  Setup Time   Final    AEROBIC BOTTLE ONLY GRAM NEGATIVE RODS CRITICAL RESULT CALLED TO, READ BACK BY AND VERIFIED WITH: Hughie Closs [facility] 08/13/18 2103 JDW Performed at [facility],  [address], [place], [place] [address]    Culture GRAM NEGATIVE RODS  Final   Report Status PENDING  Incomplete  Blood Culture ID Panel (Reflexed)     Status: None   Collection Time: 08/12/18  8:30 PM  Result Value Ref Range Status   Enterococcus species NOT DETECTED NOT DETECTED Final   Listeria monocytogenes NOT DETECTED NOT DETECTED Final   Staphylococcus species NOT DETECTED NOT DETECTED Final    Comment: CRITICAL RESULT CALLED TO, READ BACK BY AND VERIFIED WITH: Hughie Closs [facility] 08/13/18 2103 JDW    Staphylococcus aureus (BCID) NOT DETECTED NOT DETECTED Final   Streptococcus species NOT DETECTED NOT DETECTED Final   Streptococcus agalactiae NOT DETECTED NOT DETECTED Final   Streptococcus pneumoniae NOT DETECTED NOT DETECTED Final   Streptococcus pyogenes NOT DETECTED NOT DETECTED Final   Acinetobacter baumannii NOT DETECTED NOT DETECTED Final   Enterobacteriaceae species NOT DETECTED NOT DETECTED Final   Enterobacter cloacae complex NOT DETECTED  NOT DETECTED Final   Escherichia coli NOT DETECTED NOT DETECTED Final   Klebsiella oxytoca NOT DETECTED NOT DETECTED Final   Klebsiella pneumoniae NOT DETECTED NOT DETECTED Final   Proteus species NOT DETECTED NOT DETECTED Final   Serratia marcescens NOT DETECTED NOT DETECTED Final   Haemophilus influenzae NOT DETECTED NOT DETECTED Final   Neisseria meningitidis NOT DETECTED NOT DETECTED Final   Pseudomonas aeruginosa NOT DETECTED NOT DETECTED Final   Candida albicans NOT DETECTED NOT DETECTED Final   Candida glabrata NOT DETECTED NOT DETECTED Final   Candida krusei NOT DETECTED NOT DETECTED Final   Candida parapsilosis NOT DETECTED NOT DETECTED Final   Candida tropicalis NOT DETECTED NOT DETECTED Final    Comment: Performed at [facility], [place]. [address], [place], [place] [address]  MRSA PCR Screening     Status: None   Collection Time: 08/13/18 12:52 AM  Result Value Ref Range Status   MRSA by PCR NEGATIVE NEGATIVE Final    Comment:        The GeneXpert MRSA Assay (FDA approved for NASAL specimens only), is one component of a comprehensive MRSA colonization surveillance program. It is not intended to diagnose MRSA infection nor to guide or monitor treatment for MRSA infections. Performed at [place] [facility], [place] [address] [place], [place] [address]          Radiology Studies: Dg Wrist Complete Left  Result Date: 08/12/2018 CLINICAL DATA:  Acute onset of left wrist swelling and erythema. Initial encounter. EXAM: LEFT WRIST - COMPLETE 3+ VIEW COMPARISON:  None. FINDINGS: There is no evidence of fracture or dislocation. There appears to be large erosions of the distal scaphoid and proximal trapezium and trapezoid. This may reflect prior surgery, or possibly erosive arthritis. Would correlate with the patient's history. Scattered vascular calcifications are seen. IMPRESSION: 1. No evidence of fracture or dislocation. 2. Apparent large erosions of the distal scaphoid  and proximal trapezium and trapezoid. This may reflect prior surgery, or possibly erosive arthritis. Would correlate with the patient's history. 3. Scattered vascular calcifications seen. Electronically Signed   By: Garald Balding M.D.   On: 08/12/2018 21:29   Dg Chest Port 1 View  Result Date: 08/12/2018 CLINICAL DATA:  Acute onset of altered mental status. Status post dialysis. EXAM: PORTABLE CHEST 1 VIEW COMPARISON:  Chest radiograph performed 05/27/2018 FINDINGS: The lungs are well-aerated. Minimal bibasilar atelectasis is noted. There is no evidence of pleural effusion or pneumothorax. The cardiomediastinal silhouette is enlarged. No acute  osseous abnormalities are seen. Clips are seen overlying the left axilla. IMPRESSION: 1. Minimal bibasilar atelectasis noted; lungs otherwise clear. 2. Cardiomegaly. Electronically Signed   By: Garald Balding M.D.   On: 08/12/2018 21:27        Scheduled Meds: . amLODipine  10 mg Oral Daily  . cloNIDine  0.2 mg Transdermal Q Wed  . darbepoetin (ARANESP) injection - DIALYSIS  25 mcg Intravenous Q Thu-HD  . doxercalciferol  3 mcg Intravenous Q T,Th,Sa-HD  . valACYclovir  500 mg Oral Q48H   Continuous Infusions: . sodium chloride 250 mL (08/13/18 1021)  . [START ON 08/15/2018] ceFEPime (MAXIPIME) IV    . ceFEPime (MAXIPIME) IV 2 g (08/14/18 1015)     LOS: 2 days     Cordelia Poche, MD [facility] 08/14/2018, 10:38 AM  If 7PM-7AM, please contact night-coverage [email or web address]

## 2018-08-14 NOTE — NC FL2 (Addendum)
[place] MEDICAID FL2 LEVEL OF CARE SCREENING TOOL     IDENTIFICATION  Patient Name: [name] Birthdate: 1941/06/21 Sex: female Admission Date (Current Location): 08/12/2018  [place] and [place] Number:  [name] [id number] [place] and Address:  The [place], [place] [address], [place], [place] [address]      Provider Number: [id number]  Attending Physician Name and Address:  Mariel Aloe, MD  Relative Name and Phone Number:  DaughterAnina [name] [phone](FEOF) [phone](PQDI)    Current Level of Care: Hospital Recommended Level of Care: [place] Prior Approval Number:    Date Approved/Denied:   PASRR Number: [id number]  Discharge Plan: SNF    Current Diagnoses: Patient Active Problem List   Diagnosis Date Noted  . Acute lower UTI 08/12/2018  . Swelling of joint of left wrist 08/12/2018  . Rectal bleeding 08/12/2018  . Neurocognitive deficits 06/05/2018  . Altered mental status 05/28/2018  . Macrocytosis 05/01/2018  . Hypertension, uncontrolled 04/16/2018  . ESRD needing dialysis ([place]) 04/16/2018  . Encephalopathy acute 04/16/2018  . Anxiety and depression 04/16/2018  . Hypertension, accelerated 04/16/2018  . Pressure ulcer, stage I 04/16/2018  . Abnormal chest x-ray 12/26/2017  . HCAP (healthcare-associated pneumonia) 04/24/2017  . Sepsis ([place]) 04/24/2017  . Nausea and vomiting 04/24/2017  . Thrombocytopenia ([place]) 04/24/2017  . Hyperkalemia 01/16/2017  . IBS (irritable bowel syndrome) 12/27/2016  . Mood disorder ([place]) 07/19/2016  . Diastolic dysfunction 07/68/0881  . Insomnia 04/27/2016  . Hyponatremia 03/12/2016  . Diarrhea 03/12/2016  . Hypertensive urgency 02/18/2016  . Swelling of joint of left knee 02/18/2016  . Leukopenia due to antineoplastic chemotherapy ([place]) 09/03/2015  . Malnutrition of moderate degree 09/02/2015  . GERD (gastroesophageal reflux disease) 04/23/2015  . Anemia in neoplastic  disease 08/30/2014  . Multiple myeloma ([place]) 03/11/2014  . Hypertension associatd with end stage renal disease on dialysis 03/11/2014    Orientation RESPIRATION BLADDER Height & Weight     Self, Place  Normal Incontinent Weight: 126 lb 15.1 oz (57.6 kg) Height:     BEHAVIORAL SYMPTOMS/MOOD NEUROLOGICAL BOWEL NUTRITION STATUS      Incontinent Diet(Renal diet, fluid restriction 1217m, Nectar thick fluid)  AMBULATORY STATUS COMMUNICATION OF NEEDS Skin   Limited Assist Verbally Other (Comment)(Stitches on left forehead ; Cellulitis-hand and foot ; Fissure-medial buttocks- cleansed and barrier cream)                       Personal Care Assistance Level of Assistance  Bathing, Feeding, Dressing Bathing Assistance: Limited assistance Feeding assistance: Limited assistance(Assistance with setup) Dressing Assistance: Limited assistance     Functional Limitations Info  Sight, Hearing, Speech Sight Info: Adequate Hearing Info: Adequate Speech Info: Impaired(Slurred speech/ dysarthria)    SPECIAL CARE FACTORS FREQUENCY   Speech (swallow) eval 08/14/18   Speech at SNF eval and treat                  Contractures Contractures Info: Not present    Additional Factors Info  Code Status, Allergies Code Status Info: Full code Allergies Info: Phenergan (Promethazine Hcl)           Current Medications (08/14/2018):  This is the current hospital active medication list Current Facility-Administered Medications  Medication Dose Route Frequency Provider Last Rate Last Dose  . 0.9 %  sodium chloride infusion   Intravenous PRN NMariel Aloe MD 10 mL/hr at 08/13/18 1021 250 mL at 08/13/18 1021  . acetaminophen (TYLENOL) tablet  650 mg  650 mg Oral Q6H PRN Johnson-Pitts, Endia, MD   650 mg at 08/13/18 1813   Or  . acetaminophen (TYLENOL) suppository 650 mg  650 mg Rectal Q6H PRN Johnson-Pitts, Endia, MD      . amLODipine (NORVASC) tablet 10 mg  10 mg Oral Daily Johnson-Pitts,  Endia, MD   10 mg at 08/14/18 1007  . [START ON 08/15/2018] ceFEPIme (MAXIPIME) 1 g in sodium chloride 0.9 % 100 mL IVPB  1 g Intravenous Once Rolla Flatten, [facility]      . [START ON 08/16/2018] ceFEPIme (MAXIPIME) 1 g in sodium chloride 0.9 % 100 mL IVPB  1 g Intravenous Q24H Rolla Flatten, RPH      . cloNIDine (CATAPRES - Dosed in mg/24 hr) patch 0.2 mg  0.2 mg Transdermal Q Wed Johnson-Pitts, Endia, MD   0.2 mg at 08/13/18 0959  . cloNIDine (CATAPRES) tablet 0.1 mg  0.1 mg Oral Q8H PRN Johnson-Pitts, Endia, MD      . Darbepoetin Alfa (ARANESP) injection 25 mcg  25 mcg Intravenous Q Thu-HD Zeyfang, David, PA-C      . doxercalciferol (HECTOROL) injection 3 mcg  3 mcg Intravenous Q T,Th,Sa-HD Zeyfang, David, PA-C      . ondansetron (ZOFRAN) tablet 4 mg  4 mg Oral Q6H PRN Johnson-Pitts, Endia, MD       Or  . ondansetron (ZOFRAN) injection 4 mg  4 mg Intravenous Q6H PRN Johnson-Pitts, Endia, MD      . polyethylene glycol (MIRALAX / GLYCOLAX) packet 17 g  17 g Oral Daily PRN Johnson-Pitts, Endia, MD      . RESOURCE THICKENUP CLEAR   Oral PRN Mariel Aloe, MD      . valACYclovir (VALTREX) tablet 500 mg  500 mg Oral Q48H Mariel Aloe, MD         Discharge Medications: Please see discharge summary for a list of discharge medications.  Relevant Imaging Results:  Relevant Lab Results:   Additional Information SSN: [id number] ; Dialysis patient- [place], [place] Work [phone]

## 2018-08-14 NOTE — Consult Note (Addendum)
[facility] Renal Consultation Note  Indication for Consultation:  Management of ESRD/hemodialysis; anemia, hypertension/volume and secondary hyperparathyroidism  HPI: [name] is a 77 y.o. female with ESRD sec to multiple myeloma, on HD since 07/2013, MM chemo followed by [name]. ,(currently on Carfilzemb. ) Carrie admits [name] 07/17-07/19/19 AMS 2/2 uremia-missed HD sessions/08/27-08/30/19 AMS/HTN urgency. Missed HD and not taking antihypertensive meds.  Now admitted with [name]  From her NH , In ER temp 103,CXR = NAD, Lactic acid 2.13 , bun 59 Scr 9.67 ( has NOT missed any recent hd) ,k 4.3 , wbc 9.3 ,hgb 11.3 .Had BRBPR in ER (not on bld thinners) .   Noted BC  From 08/12/18 Gram neg rod in aerobic bottle /UA  Turbid  Cs pend   Consulted for Hemodialysis /ESRD issues ,she remains pleasantly confused this am ,repeating "2019" with most questions .Aware she has HD TTS , denies pain,sob, cp, n/v/d/abd pain .      Past Medical History:  Diagnosis Date  . Anxiety   . Closed fracture of right distal femur ([place]) 09/01/2015  . Depression   . ESRD (end stage renal disease) on dialysis [name])    "TTS; [name]" (04/24/2017)  . GERD (gastroesophageal reflux disease) 04/23/2015  . HCAP (healthcare-associated pneumonia) 04/24/2017  . Heart murmur   . History of blood transfusion    "low HgB when I was going to dialysis center"  . Hypertension   . Hypertension associated with end stage renal disease on dialysis 03/11/2014  . Malnutrition of moderate degree 09/02/2015  . Multiple myeloma ([name]) 03/11/2014  . Patient is Jehovah's Witness    "I'd rather never have any blood transfusions unless absolutely necessary; please check with me 1st". (04/24/2017)  . Pneumonia ~ 2016    Past Surgical History:  Procedure Laterality Date  . AV FISTULA PLACEMENT Left   . I&D EXTREMITY Left 02/27/2016   Procedure: ARTHROSCOPIC IRRIGATION AND DEBRIDEMENT EXTREMITY;  Surgeon: Renette Butters, MD;  Location: [place];  Service: Orthopedics;  Laterality: Left;  . TEE WITHOUT CARDIOVERSION N/A 02/29/2016   Procedure: TRANSESOPHAGEAL ECHOCARDIOGRAM (TEE);  Surgeon: Thayer Headings, MD;  Location:  [name];  Service: Cardiovascular;  Laterality: N/A;      Family History  Problem Relation Age of Onset  . Hypertension Mother   . Hypertension Father       reports that she has never smoked. She has never used smokeless tobacco. She reports that she does not drink alcohol or use drugs.   Allergies  Allergen Reactions  . Phenergan [Promethazine Hcl] Other (See Comments)    Acute encephalopathy in the context of refusal to go to hemodialysis and administration of Phenergan for nausea and vomiting    Prior to Admission medications   Medication Sig Start Date End Date Taking? Authorizing Provider  acetaminophen (TYLENOL) 325 MG tablet Take 650 mg by mouth every 6 (six) hours as needed.   Yes [provider]  amLODipine (NORVASC) 10 MG tablet Take 10 mg by mouth daily. Hold for SBP <100MM/HG   Yes [provider]  bisacodyl (BISAC-EVAC) 10 MG suppository Place 10 mg rectally once as needed for moderate constipation (FOR CONSTIPATION NOT RELIEVED BY MILK OF MAGNESIA).    Yes [provider]  calcium acetate (PHOSLO) 667 MG capsule Take 1,334 mg by mouth 3 (three) times daily with meals.    Yes [provider]  cloNIDine (CATAPRES - DOSED IN MG/24 HR) 0.2 mg/24hr patch Place 1  patch (0.2 mg total) onto the skin once a week. 06/04/18  Yes Emokpae, Courage, MD  cloNIDine (CATAPRES) 0.1 MG tablet Take 0.1 mg by mouth every 8 (eight) hours as needed (for a systolic B/P of 878 or greater).    Yes [provider]  hydrocortisone cream 1 % Apply 1 application topically every 8 (eight) hours as needed for itching. Apply to rectum for hemorrhoids   Yes [provider]  isosorbide mononitrate (IMDUR) 60 MG 24 hr tablet Take 60 mg by mouth  daily.  03/24/15  Yes [provider]  lamoTRIgine (LAMICTAL) 25 MG tablet Take 50 mg by mouth daily.    Yes [provider]  latanoprost (XALATAN) 0.005 % ophthalmic solution Place 1 drop into both eyes at bedtime.    Yes [provider]  loperamide (IMODIUM A-D) 2 MG tablet Take 4 mg by mouth as needed for diarrhea or loose stools. The patient requests this for dialysis days.   Yes [provider]  LORazepam (ATIVAN) 0.5 MG tablet Take 1 tablet (0.5 mg total) by mouth at bedtime. 07/18/18  Yes Medina-Vargas, Monina C, NP  losartan (COZAAR) 50 MG tablet Take 50 mg by mouth at bedtime.   Yes [provider]  multivitamin (RENA-VIT) TABS tablet Take 1 tablet by mouth daily.   Yes [provider]  ondansetron (ZOFRAN) 4 MG tablet Take 4 mg by mouth every 8 (eight) hours as needed for nausea or vomiting.   Yes [provider]  ranitidine (ZANTAC) 300 MG tablet Take 300 mg by mouth at bedtime. Reported on 03/05/2016   Yes [provider]  saccharomyces boulardii (FLORASTOR) 250 MG capsule Take 250 mg by mouth See admin instructions. Take one capsule BID on M-W-F for supplement and BID PRN for loose stool.   Yes [provider]  senna-docusate (SENEXON-S) 8.6-50 MG tablet Take 2 tablets by mouth at bedtime as needed.    Yes [provider]  sertraline (ZOLOFT) 50 MG tablet Take 50 mg by mouth daily.    Yes [provider]  sevelamer carbonate (RENVELA) 800 MG tablet Take 2 tablets (1,600 mg total) by mouth 3 (three) times daily with meals. 05/30/18  Yes Emokpae, Courage, MD  simethicone (MYLICON) 676 MG chewable tablet Chew 125 mg by mouth 3 (three) times daily. Take 1 tablet by mouth prior to each meal at 8AM, 11AM, and 6PM   Yes [provider]  valACYclovir (VALTREX) 500 MG tablet Take 1 tablet (500 mg total) by mouth every other day. 03/01/16  Yes Modena Jansky, MD    HMC:NOBSJG chloride,  acetaminophen **OR** acetaminophen, cloNIDine, ondansetron **OR** ondansetron (ZOFRAN) IV, polyethylene glycol, RESOURCE THICKENUP CLEAR  Results for orders placed or performed during the [name] encounter of 08/12/18 (from the past 48 hour(s))  Comprehensive metabolic panel     Status: Abnormal   Collection Time: 08/12/18  8:25 PM  Result Value Ref Range   Sodium 133 (L) 135 - 145 mmol/L   Potassium 4.3 3.5 - 5.1 mmol/L   Chloride 94 (L) 98 - 111 mmol/L   CO2 25 22 - 32 mmol/L   Glucose, Bld 92 70 - 99 mg/dL   BUN 59 (H) 8 - 23 mg/dL   Creatinine, Ser 9.67 (H) 0.44 - 1.00 mg/dL   Calcium 9.9 8.9 - 10.3 mg/dL   Total Protein 9.7 (H) 6.5 - 8.1 g/dL   Albumin 3.5 3.5 - 5.0 g/dL   AST 31 15 - 41 U/L  ALT 13 0 - 44 U/L   Alkaline Phosphatase 60 38 - 126 U/L   Total Bilirubin 1.1 0.3 - 1.2 mg/dL   GFR calc non Af Amer 3 (L) >60 mL/min   GFR calc Af Amer 4 (L) >60 mL/min    Comment: (NOTE) The eGFR has been calculated using the CKD EPI equation. This calculation has not been validated in all clinical situations. eGFR's persistently <60 mL/min signify possible Chronic Kidney Disease.    Anion gap 14 5 - 15    Comment: Performed at [facility] [address] [place], [place] [address]  CBC WITH DIFFERENTIAL     Status: Abnormal   Collection Time: 08/12/18  8:25 PM  Result Value Ref Range   WBC 9.3 4.0 - 10.5 K/uL   RBC 3.62 (L) 3.87 - 5.11 MIL/uL   Hemoglobin 11.3 (L) 12.0 - 15.0 g/dL   HCT 37.7 36.0 - 46.0 %   MCV 104.1 (H) 80.0 - 100.0 fL   MCH 31.2 26.0 - 34.0 pg   MCHC 30.0 30.0 - 36.0 g/dL   RDW 16.4 (H) 11.5 - 15.5 %   Platelets 192 150 - 400 K/uL   nRBC 0.0 0.0 - 0.2 %   Neutrophils Relative % 94 %   Lymphocytes Relative 4 %   Monocytes Relative 2 %   Eosinophils Relative 0 %   Basophils Relative 0 %   Band Neutrophils 0 %   Metamyelocytes Relative 0 %   Myelocytes 0 %   Promyelocytes Relative 0 %   Blasts 0 %   nRBC 0 0 /100 WBC   Other 0 %   Neutro  Abs 8.7 (H) 1.7 - 7.7 K/uL   Lymphs Abs 0.4 (L) 0.7 - 4.0 K/uL   Monocytes Absolute 0.2 0.1 - 1.0 K/uL   Eosinophils Absolute 0.0 0.0 - 0.5 K/uL   Basophils Absolute 0.0 0.0 - 0.1 K/uL   RBC Morphology POLYCHROMASIA PRESENT    WBC Morphology INCREASED BANDS (>20% BANDS)     Comment: Performed at [name], [place] [address], [place], [place] [address]  Blood Culture (routine x 2)     Status: None (Preliminary result)   Collection Time: 08/12/18  8:25 PM  Result Value Ref Range   Specimen Description BLOOD RIGHT ANTECUBITAL    Special Requests      BOTTLES DRAWN AEROBIC AND ANAEROBIC Blood Culture results may not be optimal due to an excessive volume of blood received in culture bottles   Culture  Setup Time      AEROBIC BOTTLE ONLY GRAM NEGATIVE RODS CRITICAL VALUE NOTED.  VALUE IS CONSISTENT WITH PREVIOUSLY REPORTED AND CALLED VALUE. Performed at [name], [place] [address], [place], [place] [address]    Culture GRAM NEGATIVE RODS    Report Status PENDING   Urinalysis, Routine w reflex microscopic     Status: Abnormal   Collection Time: 08/12/18  8:25 PM  Result Value Ref Range   Color, Urine YELLOW YELLOW   APPearance TURBID (A) CLEAR   Specific Gravity, Urine 1.010 1.005 - 1.030   pH 8.0 5.0 - 8.0   Glucose, UA 50 (A) NEGATIVE mg/dL   Hgb urine dipstick MODERATE (A) NEGATIVE   Bilirubin Urine NEGATIVE NEGATIVE   Ketones, ur NEGATIVE NEGATIVE mg/dL   Protein, ur 100 (A) NEGATIVE mg/dL   Nitrite NEGATIVE NEGATIVE   Leukocytes, UA LARGE (A) NEGATIVE   RBC / HPF >50 (H) 0 - 5  RBC/hpf   WBC, UA >50 (H) 0 - 5 WBC/hpf   Bacteria, UA FEW (A) NONE SEEN   Squamous Epithelial / LPF 0-5 0 - 5   WBC Clumps PRESENT    Non Squamous Epithelial 11-20 (A) NONE SEEN    Comment: Performed at [name], [place] [address] [place], [place] [address]  Blood Culture (routine x 2)     Status: None (Preliminary result)   Collection Time: 08/12/18  8:30 PM  Result Value Ref  Range   Specimen Description BLOOD RIGHT HAND    Special Requests      BOTTLES DRAWN AEROBIC AND ANAEROBIC Blood Culture adequate volume   Culture  Setup Time      AEROBIC BOTTLE ONLY GRAM NEGATIVE RODS CRITICAL RESULT CALLED TO, READ BACK BY AND VERIFIED WITH: Hughie Closs [facility] 08/13/18 2103 JDW Performed at [name], [place] [address] [place], [place] [address]    Culture GRAM NEGATIVE RODS    Report Status PENDING   Blood Culture ID Panel (Reflexed)     Status: None   Collection Time: 08/12/18  8:30 PM  Result Value Ref Range   Enterococcus species NOT DETECTED NOT DETECTED   Listeria monocytogenes NOT DETECTED NOT DETECTED   Staphylococcus species NOT DETECTED NOT DETECTED    Comment: CRITICAL RESULT CALLED TO, READ BACK BY AND VERIFIED WITH: Hughie Closs [facility] 08/13/18 2103 JDW    Staphylococcus aureus (BCID) NOT DETECTED NOT DETECTED   Streptococcus species NOT DETECTED NOT DETECTED   Streptococcus agalactiae NOT DETECTED NOT DETECTED   Streptococcus pneumoniae NOT DETECTED NOT DETECTED   Streptococcus pyogenes NOT DETECTED NOT DETECTED   Acinetobacter baumannii NOT DETECTED NOT DETECTED   Enterobacteriaceae species NOT DETECTED NOT DETECTED   Enterobacter cloacae complex NOT DETECTED NOT DETECTED   Escherichia coli NOT DETECTED NOT DETECTED   Klebsiella oxytoca NOT DETECTED NOT DETECTED   Klebsiella pneumoniae NOT DETECTED NOT DETECTED   Proteus species NOT DETECTED NOT DETECTED   Serratia marcescens NOT DETECTED NOT DETECTED   Haemophilus influenzae NOT DETECTED NOT DETECTED   Neisseria meningitidis NOT DETECTED NOT DETECTED   Pseudomonas aeruginosa NOT DETECTED NOT DETECTED   Candida albicans NOT DETECTED NOT DETECTED   Candida glabrata NOT DETECTED NOT DETECTED   Candida krusei NOT DETECTED NOT DETECTED   Candida parapsilosis NOT DETECTED NOT DETECTED   Candida tropicalis NOT DETECTED NOT DETECTED    Comment: Performed at [name], [address]  [place], [place] [address]  I-Stat CG4 Lactic Acid, ED     Status: Abnormal   Collection Time: 08/12/18  8:37 PM  Result Value Ref Range   Lactic Acid, Venous 2.13 (HH) 0.5 - 1.9 mmol/L   Comment NOTIFIED PHYSICIAN   DIC panel     Status: Abnormal   Collection Time: 08/12/18  8:44 PM  Result Value Ref Range   Prothrombin Time 16.1 (H) 11.4 - 15.2 seconds   INR 1.30    aPTT 35 24 - 36 seconds   Fibrinogen 551 (H) 210 - 475 mg/dL   D-Dimer, Quant 2.11 (H) 0.00 - 0.50 ug/mL-FEU    Comment: (NOTE) At the manufacturer cut-off of 0.50 ug/mL FEU, this assay has been documented to exclude PE with a sensitivity and negative predictive value of 97 to 99%.  At this time, this assay has not been approved by the FDA to exclude DVT/VTE. Results should be correlated with clinical presentation.    Platelets 189 150 -  400 K/uL   Smear Review NO SCHISTOCYTES SEEN     Comment: Performed at [name], [place] [address] [place], [place] [address]  Troponin I - ONCE - STAT     Status: Abnormal   Collection Time: 08/12/18  9:01 PM  Result Value Ref Range   Troponin I 0.05 (HH) <0.03 ng/mL    Comment: CRITICAL RESULT CALLED TO, READ BACK BY AND VERIFIED WITH: Timmothy Euler T,RN 08/12/18 2210 WAYK Performed at [name], [place] [address] [place], [place] [address]   I-Stat CG4 Lactic Acid, ED     Status: None   Collection Time: 08/12/18 10:28 PM  Result Value Ref Range   Lactic Acid, Venous 1.23 0.5 - 1.9 mmol/L  MRSA PCR Screening     Status: None   Collection Time: 08/13/18 12:52 AM  Result Value Ref Range   MRSA by PCR NEGATIVE NEGATIVE    Comment:        The GeneXpert MRSA Assay (FDA approved for NASAL specimens only), is one component of a comprehensive MRSA colonization surveillance program. It is not intended to diagnose MRSA infection nor to guide or monitor treatment for MRSA infections. Performed at [name], [place] [address] [place], [place] [address]   Uric acid      Status: None   Collection Time: 08/13/18  5:26 AM  Result Value Ref Range   Uric Acid, Serum 7.0 2.5 - 7.1 mg/dL    Comment: Performed at [facility] [address], [place], [place] [address]  Basic metabolic panel     Status: Abnormal   Collection Time: 08/13/18  5:26 AM  Result Value Ref Range   Sodium 132 (L) 135 - 145 mmol/L   Potassium 5.1 3.5 - 5.1 mmol/L    Comment: HEMOLYSIS AT THIS LEVEL MAY AFFECT RESULT   Chloride 98 98 - 111 mmol/L   CO2 22 22 - 32 mmol/L   Glucose, Bld 60 (L) 70 - 99 mg/dL   BUN 68 (H) 8 - 23 mg/dL   Creatinine, Ser 10.33 (H) 0.44 - 1.00 mg/dL   Calcium 8.9 8.9 - 10.3 mg/dL   GFR calc non Af Amer 3 (L) >60 mL/min   GFR calc Af Amer 4 (L) >60 mL/min    Comment: (NOTE) The eGFR has been calculated using the CKD EPI equation. This calculation has not been validated in all clinical situations. eGFR's persistently <60 mL/min signify possible Chronic Kidney Disease.    Anion gap 12 5 - 15    Comment: Performed at [facility] [address] [place], [place] [address]  CBC     Status: Abnormal   Collection Time: 08/13/18  5:26 AM  Result Value Ref Range   WBC 7.7 4.0 - 10.5 K/uL   RBC 3.02 (L) 3.87 - 5.11 MIL/uL   Hemoglobin 9.2 (L) 12.0 - 15.0 g/dL   HCT 31.3 (L) 36.0 - 46.0 %   MCV 103.6 (H) 80.0 - 100.0 fL   MCH 30.5 26.0 - 34.0 pg   MCHC 29.4 (L) 30.0 - 36.0 g/dL   RDW 16.5 (H) 11.5 - 15.5 %   Platelets 171 150 - 400 K/uL   nRBC 0.0 0.0 - 0.2 %    Comment: Performed at [name], [place] [address], [place], [place] [address]   ROS: see hpi  Physical Exam: Vitals:   08/14/18 0520 08/14/18 0900  BP: (!) 156/87 (!) 145/75  Pulse: (!) 59 60  Resp: 18 18  Temp: 98.4 F (36.9 C) 98.3 F (36.8 C)  SpO2: 98% 98%     General: elderly thin AAF,pleasantly confused ,NAD  HEENT: [place], not icteric , MMdry Neck: supple, no jvd Heart: RRR no m,r,g Lungs: Rll Rales,nonlabored breathing  Abdomen: BS pos, soft NT,ND ,no ascites  , no masses Extremities: trace pretibial edema  With Left Lateral anlkle erythema a  Neuro: alert  ,confused ,oreinted to year only , moves all extrem to requests  Dialysis Access: pos bruit Lua avf   Dialysis: [name]  3.5h   58kg  2/2.25 bath  Hep none LUA AVF Hec 3 mcg IV/HD Aranesp   17,000 qwkly  Hd   home bp meds Losartan 50 mg hs,Imdur 60 mg qd  Assessment/Plan 1. Ams  With febrile illness 2. Sepsis /Gmr neg rod bacteremia - wu/ antibxi pre admit   3. ESRD -  HD on schedule TTS  4. Hypertension/volume  - htn on admit ,improved with bp meds  5. Anemia  -hgb 9.2  Weekly aranesp at op  Hd and in hosp. 6. Metabolic bone disease -  renvela binder,hect. On hd 7. Rectal bleed- per admit  8. MM- per [facility] Onc=on carfilzomib and dexamethasone /valacyclovir for prophylaxis  Ernest Haber, PA-C [place] [phone] 08/14/2018, 9:23 AM   Pt seen, examined and agree w A/P as above. ESRD pt w/ MM on chemoRx here w/ AMS and gram neg sepsis, possibly urosepsis.  Seems to be improving w/ IV abx.  Plan HD today.  Kelly Splinter MD [employer] pager [phone]   08/14/2018, 1:09 PM

## 2018-08-15 DIAGNOSIS — N186 End stage renal disease: Secondary | ICD-10-CM

## 2018-08-15 DIAGNOSIS — C9000 Multiple myeloma not having achieved remission: Secondary | ICD-10-CM

## 2018-08-15 DIAGNOSIS — M8588 Other specified disorders of bone density and structure, other site: Secondary | ICD-10-CM

## 2018-08-15 DIAGNOSIS — B9689 Other specified bacterial agents as the cause of diseases classified elsewhere: Secondary | ICD-10-CM

## 2018-08-15 DIAGNOSIS — Z888 Allergy status to other drugs, medicaments and biological substances status: Secondary | ICD-10-CM

## 2018-08-15 DIAGNOSIS — R410 Disorientation, unspecified: Secondary | ICD-10-CM

## 2018-08-15 DIAGNOSIS — R7881 Bacteremia: Secondary | ICD-10-CM

## 2018-08-15 DIAGNOSIS — Z992 Dependence on renal dialysis: Secondary | ICD-10-CM

## 2018-08-15 DIAGNOSIS — Z79899 Other long term (current) drug therapy: Secondary | ICD-10-CM

## 2018-08-15 LAB — CULTURE, BLOOD (ROUTINE X 2): Special Requests: ADEQUATE

## 2018-08-15 MED ORDER — PRO-STAT SUGAR FREE PO LIQD
30.0000 mL | Freq: Two times a day (BID) | ORAL | Status: DC
  Administered 2018-08-15 – 2018-08-19 (×9): 30 mL via ORAL
  Filled 2018-08-15 (×10): qty 30

## 2018-08-15 MED ORDER — SODIUM CHLORIDE 0.9 % IV SOLN
500.0000 mg | INTRAVENOUS | Status: DC
  Administered 2018-08-15 – 2018-08-20 (×6): 500 mg via INTRAVENOUS
  Filled 2018-08-15 (×7): qty 0.5

## 2018-08-15 MED ORDER — CHLORHEXIDINE GLUCONATE CLOTH 2 % EX PADS
6.0000 | MEDICATED_PAD | Freq: Every day | CUTANEOUS | Status: DC
  Administered 2018-08-19: 6 via TOPICAL

## 2018-08-15 NOTE — Progress Notes (Signed)
PROGRESS NOTE    [name]  GLO:[id number] DOB: 1941-04-22 DOA: 08/12/2018 PCP: Hendricks Limes, MD   Brief Narrative: [name] is a 77 y.o. female with a history of MM, ESRD on HD and hypertension. She presented secondary to altered mental status with clinical scenario concerning for sepsis.   Assessment & Plan:   Active Problems:   Multiple myeloma (HCC)   Hypertension associatd with end stage renal disease on dialysis   Anemia in neoplastic disease   Mood disorder (HCC)   Sepsis ([place])   Hypertension, uncontrolled   Acute lower UTI   Swelling of joint of left wrist   Rectal bleeding   Sepsis Secondary to UTI. Empirically treated with vancomycin and ceftriaxone. Blood culture positive for achromobacter species. -Antibiotics below  Gram negative rod bacteremia Likely secondary to UTI. Culture significant for achromobacter species. -Switch to meropenem  UTI Urinalysis suggests possible infection. Urine culture appears to have been ordered but not obtained.  Metabolic encephalopathy Secondary to infection. Appears to be at baseline.  Cellulitis Improved with antibiotics. -Continue abx  ESRD on HD TTS -Nephrology  Essential hypertension -Continue amlodipine and clonidine patch  Dysphagia In setting of acute infection and metabolic encephalopathy -SLP eval/recs: Dysphagia 1 diet, nectar thick  Left arm swelling Upper extremity venous duplex significant for no DVT, but AVF  Left hand/wrist pain Patient with history of multiple falls. No fracture on x-ray  Multiple myeloma Currently on carfilzomib and dexamethasone per oncologist at [facility] valacyclovir for prophylaxis  Neurocognitive deficits  Left eyelid laceration S/p sutures. Present on arrival.  Rectal bleeding? No FOBT. Hemoglobin down from admission. Possibly secondary to dilution vs bleeding. Mentioned in admission notes. Hemoglobin stable. -FOBT  Pressure Injury  Documentation: Pressure Injury 04/16/18 Stage II -  Partial thickness loss of dermis presenting as a shallow open ulcer with a red, pink wound bed without slough. stage II  (Active)  04/16/18 1939   Location: Buttocks  Location Orientation: Medial  Staging: Stage II -  Partial thickness loss of dermis presenting as a shallow open ulcer with a red, pink wound bed without slough.  Wound Description (Comments): stage II   Present on Admission: Yes     DVT prophylaxis: SCDs Code Status:   Code Status: Full Code Family Communication: None at bedside Disposition Plan: Discharge to SNF when medically stable   Consultants:   Nephrology  Procedures:   None  Antimicrobials:  Vancomycin   Ceftriaxone   Subjective: No issues today. Patient states she has fallen many times and that is why her left arm hurts.  Objective: Vitals:   08/14/18 1700 08/14/18 1730 08/14/18 1759 08/15/18 0747  BP: (!) 141/73 (!) 162/61 (!) 154/83 (!) 160/70  Pulse: 64 72 81 80  Resp:   20 18  Temp:   98.2 F (36.8 C) 98.1 F (36.7 C)  TempSrc:   Oral Skin  SpO2:   97% 98%  Weight:   58.4 kg     Intake/Output Summary (Last 24 hours) at 08/15/2018 1216 Last data filed at 08/15/2018 1156 Gross per 24 hour  Intake 390 ml  Output 1500 ml  Net -1110 ml   Filed Weights   08/12/18 2022 08/14/18 1420 08/14/18 1759  Weight: 57.6 kg 60.5 kg 58.4 kg    Examination:  General exam: Appears calm and comfortable Respiratory system: Clear to auscultation. Respiratory effort normal. Cardiovascular system: S1 & S2 heard, RRR. 2/6 systolic murmur Gastrointestinal system: Abdomen is nondistended, soft and nontender. No  organomegaly or masses felt. Normal bowel sounds heard. Central nervous system: Alert and oriented to person (thinks she is at [facility]) but is oriented to year. No focal neurological deficits. Extremities: Left arm edema extending to hand. Tenderness of wrist and hand. No calf tenderness.    Skin: No cyanosis. No rashes Psychiatry: Judgement and insight appear normal. Mood & affect appropriate.     Data Reviewed: I have personally reviewed following labs and imaging studies  CBC: Recent Labs  Lab 08/12/18 2025 08/12/18 2044 08/13/18 0526 08/14/18 1127  WBC 9.3  --  7.7 13.1*  NEUTROABS 8.7*  --   --   --   HGB 11.3*  --  9.2* 11.2*  HCT 37.7  --  31.3* 36.8  MCV 104.1*  --  103.6* 102.8*  PLT 192 189 171 284   Basic Metabolic Panel: Recent Labs  Lab 08/12/18 2025 08/13/18 0526 08/14/18 1428  NA 133* 132* 133*  K 4.3 5.1 4.6  CL 94* 98 98  CO2 25 22 20*  GLUCOSE 92 60* 85  BUN 59* 68* 92*  CREATININE 9.67* 10.33* 12.26*  CALCIUM 9.9 8.9 8.9  PHOS  --   --  5.9*   GFR: Estimated Creatinine Clearance: 3.1 mL/min (A) (by C-G formula based on SCr of 12.26 mg/dL (H)). Liver Function Tests: Recent Labs  Lab 08/12/18 2025 08/14/18 1428  AST 31  --   ALT 13  --   ALKPHOS 60  --   BILITOT 1.1  --   PROT 9.7*  --   ALBUMIN 3.5 2.6*   No results for input(s): LIPASE, AMYLASE in the last 168 hours. No results for input(s): AMMONIA in the last 168 hours. Coagulation Profile: Recent Labs  Lab 08/12/18 2044  INR 1.30   Cardiac Enzymes: Recent Labs  Lab 08/12/18 2101  TROPONINI 0.05*   BNP (last 3 results) No results for input(s): PROBNP in the last 8760 hours. HbA1C: No results for input(s): HGBA1C in the last 72 hours. CBG: No results for input(s): GLUCAP in the last 168 hours. Lipid Profile: No results for input(s): CHOL, HDL, LDLCALC, TRIG, CHOLHDL, LDLDIRECT in the last 72 hours. Thyroid Function Tests: No results for input(s): TSH, T4TOTAL, FREET4, T3FREE, THYROIDAB in the last 72 hours. Anemia Panel: No results for input(s): VITAMINB12, FOLATE, FERRITIN, TIBC, IRON, RETICCTPCT in the last 72 hours. Sepsis Labs: Recent Labs  Lab 08/12/18 2037 08/12/18 2228  LATICACIDVEN 2.13* 1.23    Recent Results (from the past 240 hour(s))   Blood Culture (routine x 2)     Status: Abnormal   Collection Time: 08/12/18  8:25 PM  Result Value Ref Range Status   Specimen Description BLOOD RIGHT ANTECUBITAL  Final   Special Requests   Final    BOTTLES DRAWN AEROBIC AND ANAEROBIC Blood Culture results may not be optimal due to an excessive volume of blood received in culture bottles   Culture  Setup Time   Final    AEROBIC BOTTLE ONLY GRAM NEGATIVE RODS CRITICAL VALUE NOTED.  VALUE IS CONSISTENT WITH PREVIOUSLY REPORTED AND CALLED VALUE.    Culture (A)  Final    ACHROMOBACTER SPECIES SUSCEPTIBILITIES PERFORMED ON PREVIOUS CULTURE WITHIN THE LAST 5 DAYS. Performed at [facility], [place] [address] [place], [place] [address]    Report Status 08/15/2018 FINAL  Final  Blood Culture (routine x 2)     Status: Abnormal   Collection Time: 08/12/18  8:30 PM  Result Value Ref Range Status  Specimen Description BLOOD RIGHT HAND  Final   Special Requests   Final    BOTTLES DRAWN AEROBIC AND ANAEROBIC Blood Culture adequate volume   Culture  Setup Time   Final    AEROBIC BOTTLE ONLY GRAM NEGATIVE RODS CRITICAL RESULT CALLED TO, READ BACK BY AND VERIFIED WITH: Hughie Closs [facility] 08/13/18 2103 JDW Performed at [facility], [place] [address], [place], [place] [address]    Culture ACHROMOBACTER SPECIES (A)  Final   Report Status 08/15/2018 FINAL  Final   Organism ID, Bacteria ACHROMOBACTER SPECIES  Final      Susceptibility   Achromobacter species - MIC*    CEFEPIME 16 INTERMEDIATE Intermediate     CEFAZOLIN >=64 RESISTANT Resistant     GENTAMICIN >=16 RESISTANT Resistant     CIPROFLOXACIN >=4 RESISTANT Resistant     IMIPENEM 4 SENSITIVE Sensitive     TRIMETH/SULFA <=20 SENSITIVE Sensitive     * ACHROMOBACTER SPECIES  Blood Culture ID Panel (Reflexed)     Status: None   Collection Time: 08/12/18  8:30 PM  Result Value Ref Range Status   Enterococcus species NOT DETECTED NOT DETECTED Final   Listeria monocytogenes NOT  DETECTED NOT DETECTED Final   Staphylococcus species NOT DETECTED NOT DETECTED Final    Comment: CRITICAL RESULT CALLED TO, READ BACK BY AND VERIFIED WITH: Hughie Closs [facility] 08/13/18 2103 JDW    Staphylococcus aureus (BCID) NOT DETECTED NOT DETECTED Final   Streptococcus species NOT DETECTED NOT DETECTED Final   Streptococcus agalactiae NOT DETECTED NOT DETECTED Final   Streptococcus pneumoniae NOT DETECTED NOT DETECTED Final   Streptococcus pyogenes NOT DETECTED NOT DETECTED Final   Acinetobacter baumannii NOT DETECTED NOT DETECTED Final   Enterobacteriaceae species NOT DETECTED NOT DETECTED Final   Enterobacter cloacae complex NOT DETECTED NOT DETECTED Final   Escherichia coli NOT DETECTED NOT DETECTED Final   Klebsiella oxytoca NOT DETECTED NOT DETECTED Final   Klebsiella pneumoniae NOT DETECTED NOT DETECTED Final   Proteus species NOT DETECTED NOT DETECTED Final   Serratia marcescens NOT DETECTED NOT DETECTED Final   Haemophilus influenzae NOT DETECTED NOT DETECTED Final   Neisseria meningitidis NOT DETECTED NOT DETECTED Final   Pseudomonas aeruginosa NOT DETECTED NOT DETECTED Final   Candida albicans NOT DETECTED NOT DETECTED Final   Candida glabrata NOT DETECTED NOT DETECTED Final   Candida krusei NOT DETECTED NOT DETECTED Final   Candida parapsilosis NOT DETECTED NOT DETECTED Final   Candida tropicalis NOT DETECTED NOT DETECTED Final    Comment: Performed at [facility], [place]. [address] [place], [place] [address]  MRSA PCR Screening     Status: None   Collection Time: 08/13/18 12:52 AM  Result Value Ref Range Status   MRSA by PCR NEGATIVE NEGATIVE Final    Comment:        The GeneXpert MRSA Assay (FDA approved for NASAL specimens only), is one component of a comprehensive MRSA colonization surveillance program. It is not intended to diagnose MRSA infection nor to guide or monitor treatment for MRSA infections. Performed at [facility], [place] [address]  [place], [place] [address]          Radiology Studies: Dg Wrist 2 Views Left  Result Date: 08/14/2018 CLINICAL DATA:  Left wrist and hand pain. EXAM: LEFT WRIST - 2 VIEW COMPARISON:  08/12/2018. FINDINGS: Diffuse osteopenia degenerative change. Erosive changes again noted about the distal scaphoid and proximal trapezium. Again possibly related to prior surgery  and or erosive arthritis. Clinical correlation suggested. No evidence of fracture dislocation. Diffuse soft tissue swelling. No radiopaque foreign body. Peripheral vascular calcification. IMPRESSION: 1. Diffuse soft tissue swelling. No evidence of fracture dislocation. 2. Erosive changes are again noted about the distal scaphoid and proximal trapezium. Again this could be rib possibly related to prior surgery and or erosive arthritis. Similar findings noted on prior exam. 3.  Peripheral vascular disease. Electronically Signed   By: Marcello Moores  Register   On: 08/14/2018 12:46   Dg Hand 2 View Left  Result Date: 08/14/2018 CLINICAL DATA:  Left wrist and hand pain. EXAM: LEFT HAND - 2 VIEW COMPARISON:  08/12/2018. FINDINGS: Diffuse soft tissue swelling. No radiopaque foreign body. Again noted erosive changes about the distal scaphoid and proximal trapezium. This could be related to prior surgery. Erosive arthropathy could present this fashion. Similar findings noted on prior exam. No evidence of fracture or dislocation. IMPRESSION: 1. Diffuse soft tissue swelling. No radiopaque foreign body. No evidence of fracture dislocation. 2. Again noted are erosive changes about the distal scaphoid and proximal trapezium. This could be related to prior surgery. Erosive arthropathy including infectious arthropathy could also present in this fashion. Similar findings noted on prior exam Electronically Signed   By: Marcello Moores  Register   On: 08/14/2018 12:48   Vas US Duplex Dialysis Access (avf, Avg)  Result Date: 08/14/2018 DIALYSIS ACCESS Reason for Exam: Swelling.  Performing Technologist: Maudry Mayhew MHA, RDMS, RVT, RDCS  Examination Guidelines: A complete evaluation includes B-mode imaging, spectral Doppler, color Doppler, and power Doppler as needed of all accessible portions of each vessel. Unilateral testing is considered an integral part of a complete examination. Limited examinations for reoccurring indications may be performed as noted.  Findings: +--------------------+----------+-----------------+--------+ AVF                 PSV (cm/s)Flow Vol (mL/min)Comments +--------------------+----------+-----------------+--------+ Native artery inflow   707                              +--------------------+----------+-----------------+--------+ AVF Anastomosis        488                              +--------------------+----------+-----------------+--------+  +------------+----------+-------------+----------+--------+ OUTFLOW VEINPSV (cm/s)Diameter (cm)Depth (cm)Describe +------------+----------+-------------+----------+--------+ Mid UA          40                                    +------------+----------+-------------+----------+--------+   Summary: Atypical flow is demonstrated in the anastomosis and in the outflow vein; not consistent with normal fistula flow. Anastomosis is stenotic.  *See table(s) above for measurements and observations.  Diagnosing physician: Monica Martinez MD Electronically signed by Monica Martinez MD on 08/14/2018 at 4:36:24 PM.   --------------------------------------------------------------------------------   Final    Vas [place] Upper Extremity Venous Duplex  Result Date: 08/14/2018 UPPER VENOUS STUDY  Indications: Swelling Limitations: Poor patient cooperation. Performing Technologist: Abram Sander  Examination Guidelines: A complete evaluation includes B-mode imaging, spectral Doppler, color Doppler, and power Doppler as needed of all accessible portions of each vessel. Bilateral testing is considered an  integral part of a complete examination. Limited examinations for reoccurring indications may be performed as noted.  Right Findings: +----------+------------+----------+---------+-----------+-------------------+ RIGHT     CompressiblePropertiesPhasicitySpontaneous      Summary       +----------+------------+----------+---------+-----------+-------------------+  Subclavian                                          unable to visualize +----------+------------+----------+---------+-----------+-------------------+  Left Findings: +----------+------------+----------+---------+-----------+-------------------+ LEFT      CompressiblePropertiesPhasicitySpontaneous      Summary       +----------+------------+----------+---------+-----------+-------------------+ IJV           Full                 Yes       Yes                        +----------+------------+----------+---------+-----------+-------------------+ Subclavian    Full                 Yes       Yes                        +----------+------------+----------+---------+-----------+-------------------+ Axillary      Full                 Yes       Yes                        +----------+------------+----------+---------+-----------+-------------------+ Brachial      Full                                                      +----------+------------+----------+---------+-----------+-------------------+ Radial        Full                                                      +----------+------------+----------+---------+-----------+-------------------+ Ulnar                                               unable to visualize +----------+------------+----------+---------+-----------+-------------------+ Cephalic      Full                                                      +----------+------------+----------+---------+-----------+-------------------+ Basilic                                              unable to visualize +----------+------------+----------+---------+-----------+-------------------+  Summary:  Left: No evidence of deep vein thrombosis in the upper extremity. No evidence of superficial vein thrombosis in the upper extremity.  *See table(s) above for measurements and observations.  Diagnosing physician: Monica Martinez MD Electronically signed by Monica Martinez MD on 08/14/2018 at 4:35:58 PM.    Final         Scheduled Meds: . amLODipine  10 mg Oral Daily  . Chlorhexidine Gluconate Cloth  6 each Topical Q0600  . cloNIDine  0.2 mg Transdermal Q Wed  . doxercalciferol  3 mcg Intravenous Q T,Th,Sa-HD  . feeding supplement (PRO-STAT SUGAR FREE 64)  30 mL Oral BID  . valACYclovir  500 mg Oral Q48H   Continuous Infusions: . sodium chloride 250 mL (08/13/18 1021)  . meropenem (MERREM) IV 500 mg (08/15/18 1141)     LOS: 3 days     Cordelia Poche, MD [facility] 08/15/2018, 12:16 PM  If 7PM-7AM, please contact night-coverage [email or web address]

## 2018-08-15 NOTE — Progress Notes (Signed)
Pharmacy Antibiotic Note  [name] is a 77 y.o. female with ESRD on HD TTS admitted on 08/12/2018 with bacteremia.  Pharmacy has been consulted for meropenem dosing.  Plan: Meropenem 500mg  IV q24h D/c cefepime  Weight: 128 lb 12 oz (58.4 kg)  Temp (24hrs), Avg:98.1 F (36.7 C), Min:98 F (36.7 C), Max:98.2 F (36.8 C)  Recent Labs  Lab 08/12/18 2025 08/12/18 2037 08/12/18 2228 08/13/18 0526 08/14/18 1127 08/14/18 1428  WBC 9.3  --   --  7.7 13.1*  --   CREATININE 9.67*  --   --  10.33*  --  12.26*  LATICACIDVEN  --  2.13* 1.23  --   --   --     Estimated Creatinine Clearance: 3.1 mL/min (A) (by C-G formula based on SCr of 12.26 mg/dL (H)).    Allergies  Allergen Reactions  . Phenergan [Promethazine Hcl] Other (See Comments)    Acute encephalopathy in the context of refusal to go to hemodialysis and administration of Phenergan for nausea and vomiting    Antimicrobials this admission: Vancomycin 11/12 >> 11/13 Cefepime 11/12 x1; restart 11/14>>11/15 Meropenem 11/15>> CTX 11/13 >> 11/14   Microbiology results: 11/12 BCx: Achromobacter I-cefepime, S-imipenem, bactrim   [name] A. Levada Dy, PharmD, [place] Pager: [phone] Please utilize Amion for appropriate phone number to reach the unit pharmacist ([place])   08/15/2018 12:37 PM

## 2018-08-15 NOTE — Consult Note (Signed)
[facility] for Infectious Disease    Date of Admission:  08/12/2018     Total days of antibiotics 4               Reason for Consult: Bacteremia   Referring Provider: Lonny Prude Primary Care Provider: Hendricks Limes, MD   Assessment/Plan:  [name] is a 77 year old female currently being treated for multiple myeloma and has end stage renal disease on dialysis admitted with confusion and fever. She had a previous mechanical fall resulting in a scalp laceration and now with Achromobacter species bacteremia. No clear source at present. Fistula site appears without infection and has not had urinary symptoms. As this bacteria is found in the soil and water question if obtained during previous fall. She is immunocompromised which could have led to infection.  She has been afebrile since admission. Antimicrobial therapy has been changed to meropenem based on sensitivities.  1. Agree with meropenem. 2. Continue to monitor fever curve and WBC count.   Active Problems:   Multiple myeloma (HCC)   Hypertension associatd with end stage renal disease on dialysis   Anemia in neoplastic disease   Mood disorder (HCC)   Sepsis ([place])   Hypertension, uncontrolled   Acute lower UTI   Swelling of joint of left wrist   Rectal bleeding   . amLODipine  10 mg Oral Daily  . Chlorhexidine Gluconate Cloth  6 each Topical Q0600  . cloNIDine  0.2 mg Transdermal Q Wed  . doxercalciferol  3 mcg Intravenous Q T,Th,Sa-HD  . feeding supplement (PRO-STAT SUGAR FREE 64)  30 mL Oral BID  . valACYclovir  500 mg Oral Q48H     HPI: [name] is a 77 y.o. female with previous medical history listed below and significant for end-stage renal disease on dialysis and multiple myeloma who presented to the ED with confusion and fever. She is maintained on Carfilzomib, dexamethasone/valacyclovir for her myeloma which is treated at [facility].   In the ED found to have a fever of 103 and code sepsis was initiated.  Started on vancomycin and cefepime at that time. There was concern for sources including her left wrist and right ankle. Chest x-ray without acute findings. X-rays of the left wrist with apparent large erosions of the distal scaphoid and proximal trapezium and trapezoid with question of previous surgery or erosive arthritis. Concern for urinary tract infection as primary source of sepsis.   [name] has been afebrile in the past 24 hours with WBC count of 13.1. She is on Day 4 of antimicrobial therapy with now Meropenem for blood cultures that were positive for Achromobacter species in 2/4 bottles.   Interval history:  [name] is confused and unable to provide any significant medical history other than she had fallen a while ago and there was a lot of blood at the time.    Review of Systems: Review of Systems  Unable to perform ROS: Mental status change     Past Medical History:  Diagnosis Date  . Anxiety   . Closed fracture of right distal femur ([place]) 09/01/2015  . Depression   . ESRD (end stage renal disease) on dialysis [facility])    "TTS; [facility]" (04/24/2017)  . GERD (gastroesophageal reflux disease) 04/23/2015  . HCAP (healthcare-associated pneumonia) 04/24/2017  . Heart murmur   . History of blood transfusion    "low HgB when I was going to dialysis center"  . Hypertension   . Hypertension  associated with end stage renal disease on dialysis 03/11/2014  . Malnutrition of moderate degree 09/02/2015  . Multiple myeloma ([place]) 03/11/2014  . Patient is Jehovah's Witness    "I'd rather never have any blood transfusions unless absolutely necessary; please check with me 1st". (04/24/2017)  . Pneumonia ~ 2016    Social History   Tobacco Use  . Smoking status: Never Smoker  . Smokeless tobacco: Never Used  Substance Use Topics  . Alcohol use: No    Alcohol/week: 0.0 standard drinks  . Drug use: No    Family History  Problem Relation Age of Onset  . Hypertension Mother   .  Hypertension Father     Allergies  Allergen Reactions  . Phenergan [Promethazine Hcl] Other (See Comments)    Acute encephalopathy in the context of refusal to go to hemodialysis and administration of Phenergan for nausea and vomiting    OBJECTIVE: Blood pressure (!) 160/70, pulse 80, temperature 98.1 F (36.7 C), temperature source Skin, resp. rate 18, weight 58.4 kg, SpO2 98 %.  Physical Exam  Constitutional: She appears well-developed and well-nourished. No distress.  Cardiovascular: Normal rate, regular rhythm, normal heart sounds and intact distal pulses. Exam reveals no gallop and no friction rub.  No murmur heard. Bruit and thrill present in left upper arm fistula. No pain or tenderness.   Pulmonary/Chest: Effort normal and breath sounds normal. No respiratory distress. She has no wheezes. She has no rales.  Abdominal: Soft. Bowel sounds are normal. She exhibits no distension. There is no tenderness. There is no guarding.  Neurological: She is alert. She is disoriented.  Skin: Skin is warm and dry.    Lab Results Lab Results  Component Value Date   WBC 13.1 (H) 08/14/2018   HGB 11.2 (L) 08/14/2018   HCT 36.8 08/14/2018   MCV 102.8 (H) 08/14/2018   PLT 185 08/14/2018    Lab Results  Component Value Date   CREATININE 12.26 (H) 08/14/2018   BUN 92 (H) 08/14/2018   NA 133 (L) 08/14/2018   K 4.6 08/14/2018   CL 98 08/14/2018   CO2 20 (L) 08/14/2018    Lab Results  Component Value Date   ALT 13 08/12/2018   AST 31 08/12/2018   ALKPHOS 60 08/12/2018   BILITOT 1.1 08/12/2018     Microbiology: Recent Results (from the past 240 hour(s))  Blood Culture (routine x 2)     Status: Abnormal   Collection Time: 08/12/18  8:25 PM  Result Value Ref Range Status   Specimen Description BLOOD RIGHT ANTECUBITAL  Final   Special Requests   Final    BOTTLES DRAWN AEROBIC AND ANAEROBIC Blood Culture results may not be optimal due to an excessive volume of blood received in  culture bottles   Culture  Setup Time   Final    AEROBIC BOTTLE ONLY GRAM NEGATIVE RODS CRITICAL VALUE NOTED.  VALUE IS CONSISTENT WITH PREVIOUSLY REPORTED AND CALLED VALUE.    Culture (A)  Final    ACHROMOBACTER SPECIES SUSCEPTIBILITIES PERFORMED ON PREVIOUS CULTURE WITHIN THE LAST 5 DAYS. Performed at [facility], [place] [address] [place], [place] [address]    Report Status 08/15/2018 FINAL  Final  Blood Culture (routine x 2)     Status: Abnormal   Collection Time: 08/12/18  8:30 PM  Result Value Ref Range Status   Specimen Description BLOOD RIGHT HAND  Final   Special Requests   Final    BOTTLES DRAWN AEROBIC AND  ANAEROBIC Blood Culture adequate volume   Culture  Setup Time   Final    AEROBIC BOTTLE ONLY GRAM NEGATIVE RODS CRITICAL RESULT CALLED TO, READ BACK BY AND VERIFIED WITH: Hughie Closs [facility] 08/13/18 2103 JDW Performed at [facility], [address] [place], [place] [address]    Culture ACHROMOBACTER SPECIES (A)  Final   Report Status 08/15/2018 FINAL  Final   Organism ID, Bacteria ACHROMOBACTER SPECIES  Final      Susceptibility   Achromobacter species - MIC*    CEFEPIME 16 INTERMEDIATE Intermediate     CEFAZOLIN >=64 RESISTANT Resistant     GENTAMICIN >=16 RESISTANT Resistant     CIPROFLOXACIN >=4 RESISTANT Resistant     IMIPENEM 4 SENSITIVE Sensitive     TRIMETH/SULFA <=20 SENSITIVE Sensitive     * ACHROMOBACTER SPECIES  Blood Culture ID Panel (Reflexed)     Status: None   Collection Time: 08/12/18  8:30 PM  Result Value Ref Range Status   Enterococcus species NOT DETECTED NOT DETECTED Final   Listeria monocytogenes NOT DETECTED NOT DETECTED Final   Staphylococcus species NOT DETECTED NOT DETECTED Final    Comment: CRITICAL RESULT CALLED TO, READ BACK BY AND VERIFIED WITH: Hughie Closs [facility] 08/13/18 2103 JDW    Staphylococcus aureus (BCID) NOT DETECTED NOT DETECTED Final   Streptococcus species NOT DETECTED NOT DETECTED Final   Streptococcus  agalactiae NOT DETECTED NOT DETECTED Final   Streptococcus pneumoniae NOT DETECTED NOT DETECTED Final   Streptococcus pyogenes NOT DETECTED NOT DETECTED Final   Acinetobacter baumannii NOT DETECTED NOT DETECTED Final   Enterobacteriaceae species NOT DETECTED NOT DETECTED Final   Enterobacter cloacae complex NOT DETECTED NOT DETECTED Final   Escherichia coli NOT DETECTED NOT DETECTED Final   Klebsiella oxytoca NOT DETECTED NOT DETECTED Final   Klebsiella pneumoniae NOT DETECTED NOT DETECTED Final   Proteus species NOT DETECTED NOT DETECTED Final   Serratia marcescens NOT DETECTED NOT DETECTED Final   Haemophilus influenzae NOT DETECTED NOT DETECTED Final   Neisseria meningitidis NOT DETECTED NOT DETECTED Final   Pseudomonas aeruginosa NOT DETECTED NOT DETECTED Final   Candida albicans NOT DETECTED NOT DETECTED Final   Candida glabrata NOT DETECTED NOT DETECTED Final   Candida krusei NOT DETECTED NOT DETECTED Final   Candida parapsilosis NOT DETECTED NOT DETECTED Final   Candida tropicalis NOT DETECTED NOT DETECTED Final    Comment: Performed at [facility], [place]. [address], [place], [place] [address]  MRSA PCR Screening     Status: None   Collection Time: 08/13/18 12:52 AM  Result Value Ref Range Status   MRSA by PCR NEGATIVE NEGATIVE Final    Comment:        The GeneXpert MRSA Assay (FDA approved for NASAL specimens only), is one component of a comprehensive MRSA colonization surveillance program. It is not intended to diagnose MRSA infection nor to guide or monitor treatment for MRSA infections. Performed at [facility], [place] [address], [place], [place] [address]      Terri Piedra, [place] [facility] [phone] Pager  08/15/2018  11:05 AM

## 2018-08-15 NOTE — Progress Notes (Signed)
  Speech Language Pathology Treatment: Dysphagia  Patient Details Name: Taylour Lietzke MRN: [id number] DOB: Apr 10, 1941 Today's Date: 08/15/2018 Time: [id number] SLP Time Calculation (min) (ACUTE ONLY): 14 min  Assessment / Plan / Recommendation Clinical Impression  Pt confused but less than initial assessment yesterday. Poor sustained attention requiring persistent verbal requests to attend to cup water and take sip. Consistent delayed coughing due to possible intrusion into airway. Recommend continuing nectar thick liquids and would benefit from MBS to further assess. Will perform as able.    HPI HPI: Mikaylah Libbey is a 77 y.o. female with PMH significant for ERSD on HD, HTN, pneumonia (2016), GERD, who presented to [facility]from SNF) on 08/12/18 with fever, AMS, cellulitis. Pt also had recent admission for a fall (07/26/18). Has been seen by ST July 2019 with MBS (recc D3/thin - possible UES dysfunction) and August 2019 (rec reg/thin). CXR showed minimal bibasilar atelectasis, cardiomegaly. Pt pocketing food with RN and expectorating food witnessed by MD.      SLP Plan  Continue with current plan of care       Recommendations  Diet recommendations: Dysphagia 1 (puree);Nectar-thick liquid Liquids provided via: Cup Medication Administration: Crushed with puree Supervision: Patient able to self feed;Intermittent supervision to cue for compensatory strategies Compensations: Minimize environmental distractions;Small sips/bites;Slow rate;Other (Comment) Postural Changes and/or Swallow Maneuvers: Seated upright 90 degrees                Oral Care Recommendations: Oral care BID Follow up Recommendations: Skilled Nursing facility SLP Visit Diagnosis: Dysphagia, unspecified (R13.10) Plan: Continue with current plan of care       GO                Houston Siren 08/15/2018, 4:36 PM  Orbie Pyo Keisha Amer M.Ed Risk analyst [phone] Office  [phone]

## 2018-08-15 NOTE — Progress Notes (Addendum)
[facility] Progress Note   Subjective:  Afebrile. Remains confused.   Objective Vitals:   08/14/18 1700 08/14/18 1730 08/14/18 1759 08/15/18 0747  BP: (!) 141/73 (!) 162/61 (!) 154/83 (!) 160/70  Pulse: 64 72 81 80  Resp:   20 18  Temp:   98.2 F (36.8 C) 98.1 F (36.7 C)  TempSrc:   Oral Skin  SpO2:   97% 98%  Weight:   58.4 kg    Physical Exam General: Ill appearing elderly female, confused  Heart: RRR  Lungs: CTAB  Abdomen: soft NT Extremities: No LE edema  Dialysis Access: LUE AVF+ bruit   Dialysis Orders:  [facility] TTS  3.5h   58kg  2/2.25 bath  Hep none LUA AVF Hec 3 mcg IV/HD EPO 17,000 qwkly  Hd    Assessment/Plan: 1. Sepsis/ Blood cultures + Achromobacter sp./suspected UTI UA+ -urine cx pending/ On meropenem - antibiotics per primary   2. AMS - 2/2 infection. Still confused but looks better than yest.  3. ESRD - HD TTS. Continue on schedule. Next HD 11/16.  4. Anemia - Hgb 9.2>11.2 On ESA as OP. Follow trends  5. HTN/volume - On amlodipine/clonidine. Post HD wt 58.4kg. No gross volume on exam  6. MBD - Cont Hectorol/Renvela binder  7. Rectal bleed Noted on admit. Hgb stable. FOBT pending  8. Nutrition - Low albumin. Add prostat  9. MM- per [facility] Onc- on carfilzomib and dexamethasone /valacyclovir for prophylaxis  Lynnda Child PA-C [facility] Pager [phone] 08/15/2018,10:23 AM  LOS: 3 days   Pt seen, examined, agree w assess/plan as above with additions as indicated.  Kelly Splinter MD [facility] pager [phone]    cell [phone] 08/15/2018, 11:38 AM     Additional Objective Labs: Basic Metabolic Panel: Recent Labs  Lab 08/12/18 2025 08/13/18 0526 08/14/18 1428  NA 133* 132* 133*  K 4.3 5.1 4.6  CL 94* 98 98  CO2 25 22 20*  GLUCOSE 92 60* 85  BUN 59* 68* 92*  CREATININE 9.67* 10.33* 12.26*  CALCIUM 9.9 8.9 8.9  PHOS  --   --  5.9*   CBC: Recent Labs  Lab 08/12/18 2025  08/12/18 2044 08/13/18 0526 08/14/18 1127  WBC 9.3  --  7.7 13.1*  NEUTROABS 8.7*  --   --   --   HGB 11.3*  --  9.2* 11.2*  HCT 37.7  --  31.3* 36.8  MCV 104.1*  --  103.6* 102.8*  PLT 192 189 171 185   Blood Culture    Component Value Date/Time   SDES BLOOD RIGHT HAND 08/12/2018 2030   SPECREQUEST  08/12/2018 2030    BOTTLES DRAWN AEROBIC AND ANAEROBIC Blood Culture adequate volume   CULT ACHROMOBACTER SPECIES (A) 08/12/2018 2030   REPTSTATUS PENDING 08/12/2018 2030    Cardiac Enzymes: Recent Labs  Lab 08/12/18 2101  TROPONINI 0.05*   CBG: No results for input(s): GLUCAP in the last 168 hours. Iron Studies: No results for input(s): IRON, TIBC, TRANSFERRIN, FERRITIN in the last 72 hours. Lab Results  Component Value Date   INR 1.30 08/12/2018   INR 1.09 04/16/2018   INR 1.62 04/24/2017   Medications: . sodium chloride 250 mL (08/13/18 1021)  . meropenem (MERREM) IV     . amLODipine  10 mg Oral Daily  . cloNIDine  0.2 mg Transdermal Q Wed  . doxercalciferol  3 mcg Intravenous Q T,Th,Sa-HD  . valACYclovir  500 mg Oral Q48H

## 2018-08-15 NOTE — Clinical Social Work Note (Signed)
Clinical Social Work Assessment  Patient Details  Name: [name] MRN: [id number] Date of Birth: Sep 09, 1941  Date of referral:  08/14/18               Reason for consult:  Discharge Planning                Permission sought to share information with:  Family Supports Permission granted to share information::  Yes, Verbal Permission Granted  Name::     [name]::     Relationship::  Daughter  Contact Information:  [phone] (h),  [phone] (cell)   Housing/Transportation Living arrangements for the past 2 months:  Skilled Nursing Facility(Patient from [facility]) Source of Information:  Patient, Adult Children, Other (Comment Required)(Daughter [name], chart review) Patient Interpreter Needed:  None Criminal Activity/Legal Involvement Pertinent to Current Situation/Hospitalization:  No - Comment as needed Significant Relationships:  Other Family Members, Adult Children, Siblings Lives with:  Facility Resident([facility]) Do you feel safe going back to the place where you live?  Yes(Currently [facility]) Need for family participation in patient care:  Yes (Comment)  Care giving concerns:  Patient and daughter did not express any concerns regarding the care received at [facility]. The daughter did indicate that the stay at [facility] is not permanent.  Social Worker assessment / plan:  CSW talked with patient at the bedside regarding patient's discharge disposition. [name] was sitting up in bed and was alert, oriented, was able to give CSW [facility]'s address, but was confused regarding some things, i.e. when she last saw her daughter. Patient gave permission for CSW to contact her daughter [name], and this was done while in room with patient. [name] confirmed that patient from [facility] and that the plan is for her to discharge back to facility when medically stable. [name] was aware that her mom had a fall and had stiches above her eye.  Daughter reported that she and her son last saw her mother Saturday at [place], and did not appear surprised that her mother did not remember the visit. [name] gave history of their family originally being from [place] and that her mom lived with her for a period of time and also lived alone.   Daughter requested that CSW contact [facility] and request they not move her mother's belongings out of her room, as when they did this before, some things were missing. Patient requested to speak with her daughter and CSW permitted patient to use CSW's phone.   Employment status:  Retired Forensic scientist:  Information systems manager, Medicaid In [place] PT Recommendations:  Not assessed at this time [place] / Referral to community resources:  Other (Comment Required)(None needed or requested as patient from SNF and plan to return)  Patient/Family's Response to care:  No concerns expressed by patient or daughter regarding care during hospitalization.  Patient/Family's Understanding of and Emotional Response to Diagnosis, Current Treatment, and Prognosis: Patient aware that she had fallen and daughter also aware of patient's fall and her being brought to hospital.  Emotional Assessment Appearance:  Appears stated age Attitude/Demeanor/Rapport:    Affect (typically observed):  Appropriate, Other(Anxious at times) Orientation:  Oriented to Self, Oriented to Place, Oriented to Situation Alcohol / Substance use:  Never Used Psych involvement (Current and /or in the community):  No (Comment)  Discharge Needs  Concerns to be addressed:  Discharge Planning Concerns Readmission within the last 30 days:  No Current discharge risk:  None Barriers to Discharge:  Continued Medical Work up   [employer], LCSW 08/15/2018, 11:16 AM

## 2018-08-16 LAB — CBC
HCT: 28.1 % — ABNORMAL LOW (ref 36.0–46.0)
HEMOGLOBIN: 8.4 g/dL — AB (ref 12.0–15.0)
MCH: 31 pg (ref 26.0–34.0)
MCHC: 29.9 g/dL — AB (ref 30.0–36.0)
MCV: 103.7 fL — AB (ref 80.0–100.0)
Platelets: 142 10*3/uL — ABNORMAL LOW (ref 150–400)
RBC: 2.71 MIL/uL — AB (ref 3.87–5.11)
RDW: 16.8 % — ABNORMAL HIGH (ref 11.5–15.5)
WBC: 5.3 10*3/uL (ref 4.0–10.5)
nRBC: 0 % (ref 0.0–0.2)

## 2018-08-16 LAB — RENAL FUNCTION PANEL
ANION GAP: 7 (ref 5–15)
Albumin: 2.2 g/dL — ABNORMAL LOW (ref 3.5–5.0)
BUN: 49 mg/dL — ABNORMAL HIGH (ref 8–23)
CHLORIDE: 98 mmol/L (ref 98–111)
CO2: 27 mmol/L (ref 22–32)
Calcium: 8.8 mg/dL — ABNORMAL LOW (ref 8.9–10.3)
Creatinine, Ser: 7.61 mg/dL — ABNORMAL HIGH (ref 0.44–1.00)
GFR calc non Af Amer: 4 mL/min — ABNORMAL LOW (ref >60)
GFR, EST AFRICAN AMERICAN: 5 mL/min — AB (ref >60)
Glucose, Bld: 83 mg/dL (ref 70–99)
POTASSIUM: 3.5 mmol/L (ref 3.5–5.1)
Phosphorus: 4.1 mg/dL (ref 2.5–4.6)
Sodium: 132 mmol/L — ABNORMAL LOW (ref 135–145)

## 2018-08-16 MED ORDER — SODIUM CHLORIDE 0.9 % IV SOLN: 100.0000 mL | INTRAVENOUS | Status: DC | PRN

## 2018-08-16 MED ORDER — PENTAFLUOROPROP-TETRAFLUOROETH EX AERO: 1.0000 "application " | INHALATION_SPRAY | CUTANEOUS | Status: DC | PRN

## 2018-08-16 MED ORDER — LIDOCAINE-PRILOCAINE 2.5-2.5 % EX CREA: 1.0000 "application " | TOPICAL_CREAM | CUTANEOUS | Status: DC | PRN

## 2018-08-16 MED ORDER — DOXERCALCIFEROL 4 MCG/2ML IV SOLN
INTRAVENOUS | Status: AC
  Administered 2018-08-16: 3 ug via INTRAVENOUS
  Filled 2018-08-16: qty 2

## 2018-08-16 MED ORDER — LIDOCAINE HCL (PF) 1 % IJ SOLN: 5.0000 mL | INTRAMUSCULAR | Status: DC | PRN

## 2018-08-16 MED ORDER — HEPARIN SODIUM (PORCINE) 1000 UNIT/ML DIALYSIS: 1000.0000 [IU] | INTRAMUSCULAR | Status: DC | PRN

## 2018-08-16 NOTE — Progress Notes (Addendum)
Subjective:  On hd, still confused   Objective Vital signs in last 24 hours: Vitals:   08/15/18 1813 08/15/18 2123 08/16/18 0457 08/16/18 0900  BP: (!) 152/103 (!) 152/102 (!) 139/93 124/86  Pulse: 72 76 62 (!) 55  Resp: (!) 22 18 16 20   Temp:  99.5 F (37.5 C) 98.9 F (37.2 C) 98.6 F (37 C)  TempSrc:  Oral Oral Oral  SpO2: 100% 98% 100% 100%  Weight:       Weight change:   Physical Exam: General: on Hd  Alert and confused pleasantly Heart: RRR, no m, g, r Lungs: CTA bilat Abdomen: Soft , NT, ND Extremities: No pedal edema  Dialysis Access: L UA A VF patent on HD     OP Dialysis Orders:  [facility] TTS 3.5h 58kg 2/2.25 bath Hep none LUA AVF Hec 13mcg IV/HD EPO 17,000 qwklyHd     Problem/Plan: 1. Sepsis/ Blood cultures + Achromobacter sp./suspected UTI ( but no UA CS done ) IV antibiotics per primary  /noted ID consulted  2. AMS - 2/2 infection.some baseline dementia and still mildly confuse but improved from admit    3. ESRD - HD TTS. Continue on schedule.  4. Anemia - Hgb 9.2>11.2 >8.4 On ESA as OP.Aranesp  Give today with hgb drop /Follow hgb  trends  5. HTN/volume - On amlodipine/clonidine. Last post wt near edw / No gross volume on exam  6. MBD - Cont Hectorol/Renvela binder  7. Rectal bleed Noted on admit. Hgb stable. FOBT pending  8. Nutrition - 3.5 >2.2 Low albumin. Add prostat  9. MM- per [facility] Onc- on carfilzomib and dexamethasone  Ernest Haber, PA-C [place] [phone] 08/16/2018,1:51 PM  LOS: 4 days   Pt seen, examined and agree w A/P as above.  Kelly Splinter MD [facility] pager [phone]   08/16/2018, 4:43 PM    Labs: Basic Metabolic Panel: Recent Labs  Lab 08/13/18 0526 08/14/18 1428 08/16/18 1247  NA 132* 133* 132*  K 5.1 4.6 3.5  CL 98 98 98  CO2 22 20* 27  GLUCOSE 60* 85 83  BUN 68* 92* 49*  CREATININE 10.33* 12.26* 7.61*  CALCIUM 8.9 8.9 8.8*  PHOS  --  5.9* 4.1   Liver  Function Tests: Recent Labs  Lab 08/12/18 2025 08/14/18 1428 08/16/18 1247  AST 31  --   --   ALT 13  --   --   ALKPHOS 60  --   --   BILITOT 1.1  --   --   PROT 9.7*  --   --   ALBUMIN 3.5 2.6* 2.2*    CBC: Recent Labs  Lab 08/12/18 2025  08/13/18 0526 08/14/18 1127 08/16/18 1246  WBC 9.3  --  7.7 13.1* 5.3  NEUTROABS 8.7*  --   --   --   --   HGB 11.3*  --  9.2* 11.2* 8.4*  HCT 37.7  --  31.3* 36.8 28.1*  MCV 104.1*  --  103.6* 102.8* 103.7*  PLT 192   < > 171 185 142*   < > = values in this interval not displayed.   Cardiac Enzymes: Recent Labs  Lab 08/12/18 2101  TROPONINI 0.05*    Medications: . sodium chloride 250 mL (08/13/18 1021)  . sodium chloride    . sodium chloride    . meropenem (MERREM) IV 500 mg (08/16/18 1018)   . amLODipine  10 mg Oral Daily  . Chlorhexidine Gluconate Cloth  6 each Topical Q0600  . cloNIDine  0.2 mg Transdermal Q Wed  . doxercalciferol  3 mcg Intravenous Q T,Th,Sa-HD  . feeding supplement (PRO-STAT SUGAR FREE 64)  30 mL Oral BID  . valACYclovir  500 mg Oral Q48H

## 2018-08-16 NOTE — Progress Notes (Signed)
PROGRESS NOTE    [name]  MEQ:[id number] DOB: 09-Oct-1940 DOA: 08/12/2018 PCP: Hendricks Limes, MD   Brief Narrative: [name] is a 77 y.o. female with a history of MM, ESRD on HD and hypertension. She presented secondary to altered mental status with clinical scenario concerning for sepsis.   Assessment & Plan:   Active Problems:   Multiple myeloma (HCC)   Hypertension associatd with end stage renal disease on dialysis   Anemia in neoplastic disease   Mood disorder (HCC)   Sepsis ([place])   Hypertension, uncontrolled   Acute lower UTI   Swelling of joint of left wrist   Rectal bleeding   Sepsis Secondary to UTI. Empirically treated with vancomycin and ceftriaxone. Blood culture positive for achromobacter species. -Antibiotics below  Achromobacter species bacteremia Likely secondary to UTI (from examination of urinalysis) although no urine culture obtained (ordered twice, patient is ESRD).  -Continue meropenem -ID recommendations  UTI Urinalysis suggests possible infection. Urine culture appears to have been ordered but not obtained.  Metabolic encephalopathy Secondary to infection. Appears to be at baseline.  Cellulitis Improved with antibiotics. -Continue abx  ESRD on HD TTS -Nephrology  Essential hypertension -Continue amlodipine and clonidine patch  Dysphagia In setting of acute infection and metabolic encephalopathy -SLP eval/recs: Dysphagia 1 diet, nectar thick  Left arm swelling Upper extremity venous duplex significant for no DVT, but AVF  Left hand/wrist pain Patient with history of multiple falls. No fracture on x-ray  Multiple myeloma Currently on carfilzomib and dexamethasone per oncologist at [place] valacyclovir for prophylaxis  Neurocognitive deficits  Left eyelid laceration S/p sutures. Present on arrival.  Rectal bleeding? No FOBT. Hemoglobin down from admission. Possibly secondary to dilution vs bleeding.  Mentioned in admission notes. Hemoglobin stable. -FOBT ordered and still pending  Pressure Injury Documentation: Pressure Injury 04/16/18 Stage II -  Partial thickness loss of dermis presenting as a shallow open ulcer with a red, pink wound bed without slough. stage II  (Active)  04/16/18 1939   Location: Buttocks  Location Orientation: Medial  Staging: Stage II -  Partial thickness loss of dermis presenting as a shallow open ulcer with a red, pink wound bed without slough.  Wound Description (Comments): stage II   Present on Admission: Yes     DVT prophylaxis: SCDs Code Status:   Code Status: Full Code Family Communication: None at bedside Disposition Plan: Discharge to SNF when medically stable   Consultants:   Nephrology  Procedures:   None  Antimicrobials:  Vancomycin   Ceftriaxone   Subjective: No issues overnight.  Objective: Vitals:   08/15/18 1813 08/15/18 2123 08/16/18 0457 08/16/18 0900  BP: (!) 152/103 (!) 152/102 (!) 139/93 124/86  Pulse: 72 76 62 (!) 55  Resp: (!) '22 18 16 20  ' Temp:  99.5 F (37.5 C) 98.9 F (37.2 C) 98.6 F (37 C)  TempSrc:  Oral Oral Oral  SpO2: 100% 98% 100% 100%  Weight:        Intake/Output Summary (Last 24 hours) at 08/16/2018 0931 Last data filed at 08/16/2018 2979 Gross per 24 hour  Intake 790 ml  Output 320 ml  Net 470 ml   Filed Weights   08/12/18 2022 08/14/18 1420 08/14/18 1759  Weight: 57.6 kg 60.5 kg 58.4 kg    Examination:  General: Well appearing, no distress   Data Reviewed: I have personally reviewed following labs and imaging studies  CBC: Recent Labs  Lab 08/12/18 2025 08/12/18 2044 08/13/18 0526 08/14/18  1127  WBC 9.3  --  7.7 13.1*  NEUTROABS 8.7*  --   --   --   HGB 11.3*  --  9.2* 11.2*  HCT 37.7  --  31.3* 36.8  MCV 104.1*  --  103.6* 102.8*  PLT 192 189 171 546   Basic Metabolic Panel: Recent Labs  Lab 08/12/18 2025 08/13/18 0526 08/14/18 1428  NA 133* 132* 133*  K 4.3  5.1 4.6  CL 94* 98 98  CO2 25 22 20*  GLUCOSE 92 60* 85  BUN 59* 68* 92*  CREATININE 9.67* 10.33* 12.26*  CALCIUM 9.9 8.9 8.9  PHOS  --   --  5.9*   GFR: Estimated Creatinine Clearance: 3.1 mL/min (A) (by C-G formula based on SCr of 12.26 mg/dL (H)). Liver Function Tests: Recent Labs  Lab 08/12/18 2025 08/14/18 1428  AST 31  --   ALT 13  --   ALKPHOS 60  --   BILITOT 1.1  --   PROT 9.7*  --   ALBUMIN 3.5 2.6*   No results for input(s): LIPASE, AMYLASE in the last 168 hours. No results for input(s): AMMONIA in the last 168 hours. Coagulation Profile: Recent Labs  Lab 08/12/18 2044  INR 1.30   Cardiac Enzymes: Recent Labs  Lab 08/12/18 2101  TROPONINI 0.05*   BNP (last 3 results) No results for input(s): PROBNP in the last 8760 hours. HbA1C: No results for input(s): HGBA1C in the last 72 hours. CBG: No results for input(s): GLUCAP in the last 168 hours. Lipid Profile: No results for input(s): CHOL, HDL, LDLCALC, TRIG, CHOLHDL, LDLDIRECT in the last 72 hours. Thyroid Function Tests: No results for input(s): TSH, T4TOTAL, FREET4, T3FREE, THYROIDAB in the last 72 hours. Anemia Panel: No results for input(s): VITAMINB12, FOLATE, FERRITIN, TIBC, IRON, RETICCTPCT in the last 72 hours. Sepsis Labs: Recent Labs  Lab 08/12/18 2037 08/12/18 2228  LATICACIDVEN 2.13* 1.23    Recent Results (from the past 240 hour(s))  Blood Culture (routine x 2)     Status: Abnormal   Collection Time: 08/12/18  8:25 PM  Result Value Ref Range Status   Specimen Description BLOOD RIGHT ANTECUBITAL  Final   Special Requests   Final    BOTTLES DRAWN AEROBIC AND ANAEROBIC Blood Culture results may not be optimal due to an excessive volume of blood received in culture bottles   Culture  Setup Time   Final    AEROBIC BOTTLE ONLY GRAM NEGATIVE RODS CRITICAL VALUE NOTED.  VALUE IS CONSISTENT WITH PREVIOUSLY REPORTED AND CALLED VALUE.    Culture (A)  Final    ACHROMOBACTER  SPECIES SUSCEPTIBILITIES PERFORMED ON PREVIOUS CULTURE WITHIN THE LAST 5 DAYS. Performed at [facility], [place] [address] [place], [place] [address]    Report Status 08/15/2018 FINAL  Final  Blood Culture (routine x 2)     Status: Abnormal   Collection Time: 08/12/18  8:30 PM  Result Value Ref Range Status   Specimen Description BLOOD RIGHT HAND  Final   Special Requests   Final    BOTTLES DRAWN AEROBIC AND ANAEROBIC Blood Culture adequate volume   Culture  Setup Time   Final    AEROBIC BOTTLE ONLY GRAM NEGATIVE RODS CRITICAL RESULT CALLED TO, READ BACK BY AND VERIFIED WITH: Hughie Closs [facility] 08/13/18 2103 JDW Performed at [facility], [place] [address], [place], [place] [address]    Culture ACHROMOBACTER SPECIES (A)  Final   Report Status 08/15/2018 FINAL  Final   Organism ID, Bacteria ACHROMOBACTER SPECIES  Final      Susceptibility   Achromobacter species - MIC*    CEFEPIME 16 INTERMEDIATE Intermediate     CEFAZOLIN >=64 RESISTANT Resistant     GENTAMICIN >=16 RESISTANT Resistant     CIPROFLOXACIN >=4 RESISTANT Resistant     IMIPENEM 4 SENSITIVE Sensitive     TRIMETH/SULFA <=20 SENSITIVE Sensitive     * ACHROMOBACTER SPECIES  Blood Culture ID Panel (Reflexed)     Status: None   Collection Time: 08/12/18  8:30 PM  Result Value Ref Range Status   Enterococcus species NOT DETECTED NOT DETECTED Final   Listeria monocytogenes NOT DETECTED NOT DETECTED Final   Staphylococcus species NOT DETECTED NOT DETECTED Final    Comment: CRITICAL RESULT CALLED TO, READ BACK BY AND VERIFIED WITH: Hughie Closs [facility] 08/13/18 2103 JDW    Staphylococcus aureus (BCID) NOT DETECTED NOT DETECTED Final   Streptococcus species NOT DETECTED NOT DETECTED Final   Streptococcus agalactiae NOT DETECTED NOT DETECTED Final   Streptococcus pneumoniae NOT DETECTED NOT DETECTED Final   Streptococcus pyogenes NOT DETECTED NOT DETECTED Final   Acinetobacter baumannii NOT DETECTED NOT DETECTED Final    Enterobacteriaceae species NOT DETECTED NOT DETECTED Final   Enterobacter cloacae complex NOT DETECTED NOT DETECTED Final   Escherichia coli NOT DETECTED NOT DETECTED Final   Klebsiella oxytoca NOT DETECTED NOT DETECTED Final   Klebsiella pneumoniae NOT DETECTED NOT DETECTED Final   Proteus species NOT DETECTED NOT DETECTED Final   Serratia marcescens NOT DETECTED NOT DETECTED Final   Haemophilus influenzae NOT DETECTED NOT DETECTED Final   Neisseria meningitidis NOT DETECTED NOT DETECTED Final   Pseudomonas aeruginosa NOT DETECTED NOT DETECTED Final   Candida albicans NOT DETECTED NOT DETECTED Final   Candida glabrata NOT DETECTED NOT DETECTED Final   Candida krusei NOT DETECTED NOT DETECTED Final   Candida parapsilosis NOT DETECTED NOT DETECTED Final   Candida tropicalis NOT DETECTED NOT DETECTED Final    Comment: Performed at [facility], [place]. [address], [place], [place] [address]  MRSA PCR Screening     Status: None   Collection Time: 08/13/18 12:52 AM  Result Value Ref Range Status   MRSA by PCR NEGATIVE NEGATIVE Final    Comment:        The GeneXpert MRSA Assay (FDA approved for NASAL specimens only), is one component of a comprehensive MRSA colonization surveillance program. It is not intended to diagnose MRSA infection nor to guide or monitor treatment for MRSA infections. Performed at [facility], [place] [address] [place], [place] [address]          Radiology Studies: Dg Wrist 2 Views Left  Result Date: 08/14/2018 CLINICAL DATA:  Left wrist and hand pain. EXAM: LEFT WRIST - 2 VIEW COMPARISON:  08/12/2018. FINDINGS: Diffuse osteopenia degenerative change. Erosive changes again noted about the distal scaphoid and proximal trapezium. Again possibly related to prior surgery and or erosive arthritis. Clinical correlation suggested. No evidence of fracture dislocation. Diffuse soft tissue swelling. No radiopaque foreign body. Peripheral vascular  calcification. IMPRESSION: 1. Diffuse soft tissue swelling. No evidence of fracture dislocation. 2. Erosive changes are again noted about the distal scaphoid and proximal trapezium. Again this could be rib possibly related to prior surgery and or erosive arthritis. Similar findings noted on prior exam. 3.  Peripheral vascular disease. Electronically Signed   By: [place]   On: 08/14/2018 12:46   Dg Hand  2 View Left  Result Date: 08/14/2018 CLINICAL DATA:  Left wrist and hand pain. EXAM: LEFT HAND - 2 VIEW COMPARISON:  08/12/2018. FINDINGS: Diffuse soft tissue swelling. No radiopaque foreign body. Again noted erosive changes about the distal scaphoid and proximal trapezium. This could be related to prior surgery. Erosive arthropathy could present this fashion. Similar findings noted on prior exam. No evidence of fracture or dislocation. IMPRESSION: 1. Diffuse soft tissue swelling. No radiopaque foreign body. No evidence of fracture dislocation. 2. Again noted are erosive changes about the distal scaphoid and proximal trapezium. This could be related to prior surgery. Erosive arthropathy including infectious arthropathy could also present in this fashion. Similar findings noted on prior exam Electronically Signed   By: Marcello Moores  Register   On: 08/14/2018 12:48   Vas US Duplex Dialysis Access (avf, Avg)  Result Date: 08/14/2018 DIALYSIS ACCESS Reason for Exam: Swelling. Performing Technologist: Maudry Mayhew MHA, RDMS, RVT, RDCS  Examination Guidelines: A complete evaluation includes B-mode imaging, spectral Doppler, color Doppler, and power Doppler as needed of all accessible portions of each vessel. Unilateral testing is considered an integral part of a complete examination. Limited examinations for reoccurring indications may be performed as noted.  Findings: +--------------------+----------+-----------------+--------+ AVF                 PSV (cm/s)Flow Vol (mL/min)Comments  +--------------------+----------+-----------------+--------+ Native artery inflow   707                              +--------------------+----------+-----------------+--------+ AVF Anastomosis        488                              +--------------------+----------+-----------------+--------+  +------------+----------+-------------+----------+--------+ OUTFLOW VEINPSV (cm/s)Diameter (cm)Depth (cm)Describe +------------+----------+-------------+----------+--------+ Mid UA          40                                    +------------+----------+-------------+----------+--------+   Summary: Atypical flow is demonstrated in the anastomosis and in the outflow vein; not consistent with normal fistula flow. Anastomosis is stenotic.  *See table(s) above for measurements and observations.  Diagnosing physician: Monica Martinez MD Electronically signed by Monica Martinez MD on 08/14/2018 at 4:36:24 PM.   --------------------------------------------------------------------------------   Final    Vas [place] Upper Extremity Venous Duplex  Result Date: 08/14/2018 UPPER VENOUS STUDY  Indications: Swelling Limitations: Poor patient cooperation. Performing Technologist: Abram Sander  Examination Guidelines: A complete evaluation includes B-mode imaging, spectral Doppler, color Doppler, and power Doppler as needed of all accessible portions of each vessel. Bilateral testing is considered an integral part of a complete examination. Limited examinations for reoccurring indications may be performed as noted.  Right Findings: +----------+------------+----------+---------+-----------+-------------------+ RIGHT     CompressiblePropertiesPhasicitySpontaneous      Summary       +----------+------------+----------+---------+-----------+-------------------+ Subclavian                                          unable to visualize  +----------+------------+----------+---------+-----------+-------------------+  Left Findings: +----------+------------+----------+---------+-----------+-------------------+ LEFT      CompressiblePropertiesPhasicitySpontaneous      Summary       +----------+------------+----------+---------+-----------+-------------------+ IJV           Full  Yes       Yes                        +----------+------------+----------+---------+-----------+-------------------+ Subclavian    Full                 Yes       Yes                        +----------+------------+----------+---------+-----------+-------------------+ Axillary      Full                 Yes       Yes                        +----------+------------+----------+---------+-----------+-------------------+ Brachial      Full                                                      +----------+------------+----------+---------+-----------+-------------------+ Radial        Full                                                      +----------+------------+----------+---------+-----------+-------------------+ Ulnar                                               unable to visualize +----------+------------+----------+---------+-----------+-------------------+ Cephalic      Full                                                      +----------+------------+----------+---------+-----------+-------------------+ Basilic                                             unable to visualize +----------+------------+----------+---------+-----------+-------------------+  Summary:  Left: No evidence of deep vein thrombosis in the upper extremity. No evidence of superficial vein thrombosis in the upper extremity.  *See table(s) above for measurements and observations.  Diagnosing physician: Monica Martinez MD Electronically signed by Monica Martinez MD on 08/14/2018 at 4:35:58 PM.    Final         Scheduled  Meds: . amLODipine  10 mg Oral Daily  . Chlorhexidine Gluconate Cloth  6 each Topical Q0600  . cloNIDine  0.2 mg Transdermal Q Wed  . doxercalciferol  3 mcg Intravenous Q T,Th,Sa-HD  . feeding supplement (PRO-STAT SUGAR FREE 64)  30 mL Oral BID  . valACYclovir  500 mg Oral Q48H   Continuous Infusions: . sodium chloride 250 mL (08/13/18 1021)  . meropenem (MERREM) IV Stopped (08/15/18 1211)     LOS: 4 days     Cordelia Poche, MD [facility] 08/16/2018, 9:31 AM  If 7PM-7AM, please contact night-coverage [email or web address]

## 2018-08-17 ENCOUNTER — Inpatient Hospital Stay (HOSPITAL_COMMUNITY): Payer: Medicare Other

## 2018-08-17 LAB — CBC
HCT: 29.7 % — ABNORMAL LOW (ref 36.0–46.0)
Hemoglobin: 8.5 g/dL — ABNORMAL LOW (ref 12.0–15.0)
MCH: 29.9 pg (ref 26.0–34.0)
MCHC: 28.6 g/dL — ABNORMAL LOW (ref 30.0–36.0)
MCV: 104.6 fL — AB (ref 80.0–100.0)
Platelets: 170 10*3/uL (ref 150–400)
RBC: 2.84 MIL/uL — AB (ref 3.87–5.11)
RDW: 16.2 % — ABNORMAL HIGH (ref 11.5–15.5)
WBC: 5.1 10*3/uL (ref 4.0–10.5)
nRBC: 0 % (ref 0.0–0.2)

## 2018-08-17 NOTE — Progress Notes (Addendum)
Subjective:  Awoken from sleep, no cos, mentation almost baseline="at [place] , sat"  Objective Vital signs in last 24 hours: Vitals:   08/16/18 2118 08/17/18 0412 08/17/18 0524 08/17/18 0900  BP: 108/80  (!) 142/87 104/75  Pulse: 64  (!) 58 64  Resp: 16  15 (!) 21  Temp: 99.3 F (37.4 C)  98 F (36.7 C) 98.5 F (36.9 C)  TempSrc: Oral   Oral  SpO2: 98%  98% 99%  Weight: 58 kg 58 kg     Weight change:   Physical Exam: General:  Alert thin chronically ill elderly female, "cone, Saturday" Heart: RRR, no m, g, r Lungs: CTA bilat Abdomen: Soft , NT, ND Extremities: No pedal edema  Dialysis Access: L UA AVF +bruit      OP DialysisOrders: [facility] 3.5h 58kg 2/2.25 bath Hep none LUA AVF Hec 64mcg IV/HD EPO17,000 qwklyHd   104/75   Problem/Plan: 1.Sepsis/ Blood cultures + Achromobacter sp./suspected UTI ( but no UA CS done despite order ) on Meropenem per primary D#6 IV abx 2. AMS - 2/2 infection some baseline dementia and baseline dementia confusion   improved from admit.  3. ESRD -HD TTS. Continue on schedule. Next HD Tuesday 4. Anemia -Hgb 9.2>11.2 >8.4 On Aranesp 17,000 as OP., 25 mcg given 08/14/18  5.HTN/volume-On amlodipine  qd/clonidine0.2 mg  Transdermal . Am bp 104/75 = will dc amlodpine /// yest  post hd wt at edw / No gross volume on exam  6.MBD- Cont Hectorol/Renvela binder corec caq 10. 2/ phos 4.1  ,use 2.0 ca bath / hold hect  7. Rectal bleed Noted on admit. Hgb stable. FOBT  ,rx per admit  8. Nutrition -3.5 >2.2 Low albumin. Add prostat  9. MM-per [facility] Onc-on carfilzomib and dexamethasone 10. L arm swelling - no DVT by duplex but atypical flow in outflow vein, VVS consulted, they will see Monday.  35.  Dispo - looks like plan is for SNF placement dc when medically ready.  Stable for d/c from renal standpoint  Ernest Haber, PA-C [place] [phone] 08/17/2018,9:41 AM  LOS: 5 days   Pt seen, examined,  agree w assess/plan as above with additions as indicated.  Kelly Splinter MD [facility] pager [phone]    cell [phone] 08/17/2018, 12:21 PM     Labs: Basic Metabolic Panel: Recent Labs  Lab 08/13/18 0526 08/14/18 1428 08/16/18 1247  NA 132* 133* 132*  K 5.1 4.6 3.5  CL 98 98 98  CO2 22 20* 27  GLUCOSE 60* 85 83  BUN 68* 92* 49*  CREATININE 10.33* 12.26* 7.61*  CALCIUM 8.9 8.9 8.8*  PHOS  --  5.9* 4.1   Liver Function Tests: Recent Labs  Lab 08/12/18 2025 08/14/18 1428 08/16/18 1247  AST 31  --   --   ALT 13  --   --   ALKPHOS 60  --   --   BILITOT 1.1  --   --   PROT 9.7*  --   --   ALBUMIN 3.5 2.6* 2.2*   No results for input(s): LIPASE, AMYLASE in the last 168 hours. No results for input(s): AMMONIA in the last 168 hours. CBC: Recent Labs  Lab 08/12/18 2025  08/13/18 0526 08/14/18 1127 08/16/18 1246  WBC 9.3  --  7.7 13.1* 5.3  NEUTROABS 8.7*  --   --   --   --   HGB 11.3*  --  9.2* 11.2* 8.4*  HCT 37.7  --  31.3* 36.8 28.1*  MCV 104.1*  --  103.6* 102.8* 103.7*  PLT 192   < > 171 185 142*   < > = values in this interval not displayed.   Cardiac Enzymes: Recent Labs  Lab 08/12/18 2101  TROPONINI 0.05*   CBG: No results for input(s): GLUCAP in the last 168 hours.  Studies/Results: No results found. Medications: . sodium chloride 250 mL (08/13/18 1021)  . meropenem (MERREM) IV 500 mg (08/16/18 1018)   . amLODipine  10 mg Oral Daily  . Chlorhexidine Gluconate Cloth  6 each Topical Q0600  . cloNIDine  0.2 mg Transdermal Q Wed  . doxercalciferol  3 mcg Intravenous Q T,Th,Sa-HD  . feeding supplement (PRO-STAT SUGAR FREE 64)  30 mL Oral BID  . valACYclovir  500 mg Oral Q48H

## 2018-08-17 NOTE — Progress Notes (Signed)
Modified Barium Swallow Progress Note  Patient Details  Name: [name] MRN: [id number] Date of Birth: [birth year]  Today's Date: 08/17/2018  Modified Barium Swallow completed.  Full report located under Chart Review in the Imaging Section.  Brief recommendations include the following:  Clinical Impression  Patient presents with a mild oropharyngeal dysphagia. Patient was upsest about having to leave room, but was compliant to complete evaluation. There was no penetration of aspiration with any consistency. Decreased mastication due to only having upper dentures. Swallow triggered at the pyriform sinuses with large cup sip. Recommend Dys 3, thin liquid diet with no straws. Medication to be given in puree. Speech therapy to follow up for diet tolerance. aspiration risk training/compensatory strategies training.    Swallow Evaluation Recommendations       SLP Diet Recommendations: Dysphagia 3 (Mech soft) solids;Thin liquid   Liquid Administration via: Cup;No straw   Medication Administration: Whole meds with puree   Supervision: Patient able to self feed;Intermittent supervision to cue for compensatory strategies   Compensations: Minimize environmental distractions;Small sips/bites;Slow rate;Other (Comment)   Postural Changes: Remain semi-upright after after feeds/meals (Comment);Seated upright at 90 degrees   Oral Care Recommendations: Oral care BID       [place], MA, CCC-SLP 08/17/2018 12:41 PM

## 2018-08-17 NOTE — Progress Notes (Signed)
PROGRESS NOTE    [name]  ZJQ:[id number] DOB: 09-13-1941 DOA: 08/12/2018 PCP: Hendricks Limes, MD   Brief Narrative: [name] is a 77 y.o. female with a history of MM, ESRD on HD and hypertension. She presented secondary to altered mental status with clinical scenario concerning for sepsis.   Assessment & Plan:   Active Problems:   Multiple myeloma (HCC)   Hypertension associatd with end stage renal disease on dialysis   Anemia in neoplastic disease   Mood disorder (HCC)   Sepsis ([place])   Hypertension, uncontrolled   Acute lower UTI   Swelling of joint of left wrist   Rectal bleeding   Sepsis Secondary to UTI and achromobacter bacteremia. Empirically treated with vancomycin and ceftriaxone. Blood culture positive for achromobacter species. -Antibiotics below  Achromobacter species bacteremia Likely secondary to UTI (from urinalysis) although no urine culture obtained (ordered twice, patient is ESRD).  -Continue meropenem -ID recommendations  UTI Urinalysis suggests possible infection. Urine culture appears to have been ordered but not obtained.  Metabolic encephalopathy Secondary to infection. Appears to be at baseline.  Cellulitis Improved with antibiotics. -Continue abx  ESRD on HD TTS -Nephrology  Essential hypertension -Continue amlodipine and clonidine patch  Dysphagia In setting of acute infection and metabolic encephalopathy -SLP eval/recs: Dysphagia 1 diet, nectar thick  Left arm swelling Upper extremity venous duplex significant for no DVT, but AVF significant for atypical flow of anastomosis/outflow vein. Swelling improved and located mostly around distal forearm -Vascular surgery consult: will see in AM. May need shuntogram   Left hand/wrist pain Patient with history of multiple falls. No fracture on x-ray  Multiple myeloma Currently on carfilzomib and dexamethasone per oncologist at [facility] valacyclovir for  prophylaxis  Neurocognitive deficits  Left eyelid laceration S/p sutures. Present on arrival.  Rectal bleeding? No FOBT. Hemoglobin down from admission. Possibly secondary to dilution vs bleeding. Mentioned in admission notes. Hemoglobin down yesterday. -FOBT ordered and still pending -Repeat CBC  Pressure Injury Documentation: Pressure Injury 04/16/18 Stage II -  Partial thickness loss of dermis presenting as a shallow open ulcer with a red, pink wound bed without slough. stage II  (Active)  04/16/18 1939   Location: Buttocks  Location Orientation: Medial  Staging: Stage II -  Partial thickness loss of dermis presenting as a shallow open ulcer with a red, pink wound bed without slough.  Wound Description (Comments): stage II   Present on Admission: Yes     DVT prophylaxis: SCDs Code Status:   Code Status: Full Code Family Communication: None at bedside Disposition Plan: Discharge to SNF when medically stable   Consultants:   Nephrology  Procedures:   None  Antimicrobials:  Vancomycin   Ceftriaxone   Subjective: Mouth is dry, otherwise, no concerns.  Objective: Vitals:   08/16/18 2118 08/17/18 0412 08/17/18 0524 08/17/18 0900  BP: 108/80  (!) 142/87 104/75  Pulse: 64  (!) 58 64  Resp: 16  15 (!) 21  Temp: 99.3 F (37.4 C)  98 F (36.7 C) 98.5 F (36.9 C)  TempSrc: Oral   Oral  SpO2: 98%  98% 99%  Weight: 58 kg 58 kg      Intake/Output Summary (Last 24 hours) at 08/17/2018 1017 Last data filed at 08/17/2018 2409 Gross per 24 hour  Intake 220 ml  Output 2000 ml  Net -1780 ml   Filed Weights   08/16/18 1600 08/16/18 2118 08/17/18 0412  Weight: 58 kg 58 kg 58 kg  Examination:  General exam: Appears calm and comfortable Respiratory system: Clear to auscultation. Respiratory effort normal. Cardiovascular system: S1 & S2 heard, RRR. No murmurs, rubs, gallops or clicks. Gastrointestinal system: Abdomen is nondistended, soft and nontender.  Normal bowel sounds heard. Central nervous system: Alert and oriented to person, place and time (Year, day but thought it was October). No focal neurological deficits. Extremities: No edema. No calf tenderness Skin: No cyanosis. No rashes Psychiatry: Judgement and insight appear normal. Mood & affect appropriate.    Data Reviewed: I have personally reviewed following labs and imaging studies  CBC: Recent Labs  Lab 08/12/18 2025 08/12/18 2044 08/13/18 0526 08/14/18 1127 08/16/18 1246  WBC 9.3  --  7.7 13.1* 5.3  NEUTROABS 8.7*  --   --   --   --   HGB 11.3*  --  9.2* 11.2* 8.4*  HCT 37.7  --  31.3* 36.8 28.1*  MCV 104.1*  --  103.6* 102.8* 103.7*  PLT 192 189 171 185 254*   Basic Metabolic Panel: Recent Labs  Lab 08/12/18 2025 08/13/18 0526 08/14/18 1428 08/16/18 1247  NA 133* 132* 133* 132*  K 4.3 5.1 4.6 3.5  CL 94* 98 98 98  CO2 25 22 20* 27  GLUCOSE 92 60* 85 83  BUN 59* 68* 92* 49*  CREATININE 9.67* 10.33* 12.26* 7.61*  CALCIUM 9.9 8.9 8.9 8.8*  PHOS  --   --  5.9* 4.1   GFR: Estimated Creatinine Clearance: 5 mL/min (A) (by C-G formula based on SCr of 7.61 mg/dL (H)). Liver Function Tests: Recent Labs  Lab 08/12/18 2025 08/14/18 1428 08/16/18 1247  AST 31  --   --   ALT 13  --   --   ALKPHOS 60  --   --   BILITOT 1.1  --   --   PROT 9.7*  --   --   ALBUMIN 3.5 2.6* 2.2*   No results for input(s): LIPASE, AMYLASE in the last 168 hours. No results for input(s): AMMONIA in the last 168 hours. Coagulation Profile: Recent Labs  Lab 08/12/18 2044  INR 1.30   Cardiac Enzymes: Recent Labs  Lab 08/12/18 2101  TROPONINI 0.05*   BNP (last 3 results) No results for input(s): PROBNP in the last 8760 hours. HbA1C: No results for input(s): HGBA1C in the last 72 hours. CBG: No results for input(s): GLUCAP in the last 168 hours. Lipid Profile: No results for input(s): CHOL, HDL, LDLCALC, TRIG, CHOLHDL, LDLDIRECT in the last 72 hours. Thyroid Function  Tests: No results for input(s): TSH, T4TOTAL, FREET4, T3FREE, THYROIDAB in the last 72 hours. Anemia Panel: No results for input(s): VITAMINB12, FOLATE, FERRITIN, TIBC, IRON, RETICCTPCT in the last 72 hours. Sepsis Labs: Recent Labs  Lab 08/12/18 2037 08/12/18 2228  LATICACIDVEN 2.13* 1.23    Recent Results (from the past 240 hour(s))  Blood Culture (routine x 2)     Status: Abnormal   Collection Time: 08/12/18  8:25 PM  Result Value Ref Range Status   Specimen Description BLOOD RIGHT ANTECUBITAL  Final   Special Requests   Final    BOTTLES DRAWN AEROBIC AND ANAEROBIC Blood Culture results may not be optimal due to an excessive volume of blood received in culture bottles   Culture  Setup Time   Final    AEROBIC BOTTLE ONLY GRAM NEGATIVE RODS CRITICAL VALUE NOTED.  VALUE IS CONSISTENT WITH PREVIOUSLY REPORTED AND CALLED VALUE.    Culture (A)  Final  ACHROMOBACTER SPECIES SUSCEPTIBILITIES PERFORMED ON PREVIOUS CULTURE WITHIN THE LAST 5 DAYS. Performed at [facility], [place] [address], [place], [place] [address]    Report Status 08/15/2018 FINAL  Final  Blood Culture (routine x 2)     Status: Abnormal   Collection Time: 08/12/18  8:30 PM  Result Value Ref Range Status   Specimen Description BLOOD RIGHT HAND  Final   Special Requests   Final    BOTTLES DRAWN AEROBIC AND ANAEROBIC Blood Culture adequate volume   Culture  Setup Time   Final    AEROBIC BOTTLE ONLY GRAM NEGATIVE RODS CRITICAL RESULT CALLED TO, READ BACK BY AND VERIFIED WITH: Hughie Closs [facility] 08/13/18 2103 JDW Performed at [facility], [place] [address], [place], [place] [address]    Culture ACHROMOBACTER SPECIES (A)  Final   Report Status 08/15/2018 FINAL  Final   Organism ID, Bacteria ACHROMOBACTER SPECIES  Final      Susceptibility   Achromobacter species - MIC*    CEFEPIME 16 INTERMEDIATE Intermediate     CEFAZOLIN >=64 RESISTANT Resistant     GENTAMICIN >=16 RESISTANT Resistant     CIPROFLOXACIN  >=4 RESISTANT Resistant     IMIPENEM 4 SENSITIVE Sensitive     TRIMETH/SULFA <=20 SENSITIVE Sensitive     * ACHROMOBACTER SPECIES  Blood Culture ID Panel (Reflexed)     Status: None   Collection Time: 08/12/18  8:30 PM  Result Value Ref Range Status   Enterococcus species NOT DETECTED NOT DETECTED Final   Listeria monocytogenes NOT DETECTED NOT DETECTED Final   Staphylococcus species NOT DETECTED NOT DETECTED Final    Comment: CRITICAL RESULT CALLED TO, READ BACK BY AND VERIFIED WITH: [facility] 08/13/18 2103 JDW    Staphylococcus aureus (BCID) NOT DETECTED NOT DETECTED Final   Streptococcus species NOT DETECTED NOT DETECTED Final   Streptococcus agalactiae NOT DETECTED NOT DETECTED Final   Streptococcus pneumoniae NOT DETECTED NOT DETECTED Final   Streptococcus pyogenes NOT DETECTED NOT DETECTED Final   Acinetobacter baumannii NOT DETECTED NOT DETECTED Final   Enterobacteriaceae species NOT DETECTED NOT DETECTED Final   Enterobacter cloacae complex NOT DETECTED NOT DETECTED Final   Escherichia coli NOT DETECTED NOT DETECTED Final   Klebsiella oxytoca NOT DETECTED NOT DETECTED Final   Klebsiella pneumoniae NOT DETECTED NOT DETECTED Final   Proteus species NOT DETECTED NOT DETECTED Final   Serratia marcescens NOT DETECTED NOT DETECTED Final   Haemophilus influenzae NOT DETECTED NOT DETECTED Final   Neisseria meningitidis NOT DETECTED NOT DETECTED Final   Pseudomonas aeruginosa NOT DETECTED NOT DETECTED Final   Candida albicans NOT DETECTED NOT DETECTED Final   Candida glabrata NOT DETECTED NOT DETECTED Final   Candida krusei NOT DETECTED NOT DETECTED Final   Candida parapsilosis NOT DETECTED NOT DETECTED Final   Candida tropicalis NOT DETECTED NOT DETECTED Final    Comment: Performed at [facility], [place]. [address] [place], [place] [address]  MRSA PCR Screening     Status: None   Collection Time: 08/13/18 12:52 AM  Result Value Ref Range Status   MRSA by PCR NEGATIVE  NEGATIVE Final    Comment:        The GeneXpert MRSA Assay (FDA approved for NASAL specimens only), is one component of a comprehensive MRSA colonization surveillance program. It is not intended to diagnose MRSA infection nor to guide or monitor treatment for MRSA infections. Performed at [facility], [place] [address]  [place], [place] [address]          Radiology Studies: No results found.      Scheduled Meds: . Chlorhexidine Gluconate Cloth  6 each Topical Q0600  . cloNIDine  0.2 mg Transdermal Q Wed  . feeding supplement (PRO-STAT SUGAR FREE 64)  30 mL Oral BID  . valACYclovir  500 mg Oral Q48H   Continuous Infusions: . sodium chloride 250 mL (08/13/18 1021)  . meropenem (MERREM) IV 500 mg (08/16/18 1018)     LOS: 5 days     Cordelia Poche, MD [facility] 08/17/2018, 10:17 AM  If 7PM-7AM, please contact night-coverage [email or web address]

## 2018-08-18 DIAGNOSIS — R7881 Bacteremia: Secondary | ICD-10-CM

## 2018-08-18 DIAGNOSIS — A419 Sepsis, unspecified organism: Secondary | ICD-10-CM

## 2018-08-18 NOTE — Progress Notes (Signed)
Pharmacy Antibiotic Note  [name] is a 77 y.o. female admitted on 08/12/2018 with concern for sepsis of unknown source and found to have Achromobacter bacteremia. Noted patient is immunocompromised  with recent treatment for multiple myeloma - antibiotics were adjusted to Meropenem on 11/12 based on bacteria sensitivities. Pharmacy is on board for Meropenem dosing.  The patient is ESRD-TTS and currently on schedule. Current Meropenem dosing remains appropriate.    Plan: - Continue Meropenem 500 mg IV every 24 hours - ID consulted - will follow-up on recommendations - Will continue to follow HD schedule/duration, culture results, LOT, and antibiotic de-escalation plans    Weight: 127 lb 13.9 oz (58 kg)  Temp (24hrs), Avg:98.7 F (37.1 C), Min:98.5 F (36.9 C), Max:98.9 F (37.2 C)  Recent Labs  Lab 08/12/18 2025 08/12/18 2037 08/12/18 2228 08/13/18 0526 08/14/18 1127 08/14/18 1428 08/16/18 1246 08/16/18 1247 08/17/18 1022  WBC 9.3  --   --  7.7 13.1*  --  5.3  --  5.1  CREATININE 9.67*  --   --  10.33*  --  12.26*  --  7.61*  --   LATICACIDVEN  --  2.13* 1.23  --   --   --   --   --   --     Estimated Creatinine Clearance: 5 mL/min (A) (by C-G formula based on SCr of 7.61 mg/dL (H)).    Allergies  Allergen Reactions  . Phenergan [Promethazine Hcl] Other (See Comments)    Acute encephalopathy in the context of refusal to go to hemodialysis and administration of Phenergan for nausea and vomiting    Antimicrobials this admission: Vancomycin 11/12 >> 11/13 Cefepime 11/12 x1; restart 11/14 >> 11/15 CTX 11/13 >> 11/14 Meropenem 11/15 >>  Microbiology results: 11/12 BCx >> 2/2 GNR (BCID neg) >>Achromobacter-I to cefepime, S meropenem and bactrim 11/13 MRSA PCR >> neg  Thank you for allowing pharmacy to be a part of this patient's care.  Alycia Rossetti, PharmD, BCPS Clinical Pharmacist Pager: [phone] Clinical phone for 08/18/2018 from 7a-3:30p: [phone] If  after 3:30p, please call main pharmacy at: [phone] Please check AMION for all [place] numbers 08/18/2018 7:47 AM

## 2018-08-18 NOTE — Care Management Important Message (Addendum)
Important Message  Patient Details  Name: [name] MRN: [id number] Date of Birth: 25-Jul-1941 CORRECTION PATIENT WAS NOT ABLE TO SIGN DUE TO ILLNESS UNSIGNED COPY LEFT   Medicare Important Message Given:    Orbie Pyo 08/18/2018, 3:58 PM

## 2018-08-18 NOTE — Consult Note (Signed)
Called to see patient regarding swelling of her left upper extremity.  Patient apparently had a fall recently.  She had a duplex ultrasound of her AV fistula which suggested possible anastomotic narrowing.  She states the fistula has been working okay on dialysis.  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  Plan: Patient can follow-up with [place] on as-needed basis.  Ruta Hinds, MD [facility] Office: [phone] Pager: [phone]

## 2018-08-18 NOTE — Progress Notes (Signed)
Subjective:   Seen in room. Alert, oriented. No complaints today.   Objective Vital signs in last 24 hours: Vitals:   08/17/18 1648 08/17/18 2017 08/18/18 0255 08/18/18 0519  BP: (!) 136/95 134/90  (!) 154/97  Pulse: (!) 55 63  63  Resp: (!) '21 17  18  ' Temp: 98.9 F (37.2 C) 98.6 F (37 C)  98.8 F (37.1 C)  TempSrc: Oral   Oral  SpO2: 100% 100%  100%  Weight:   58 kg    Weight change: -2.2 kg  Physical Exam: General:  Elderly female, alert NAD  Heart: RRR, no m, g, r Lungs: CTA bilat Abdomen: Soft , NT, ND Extremities: No pedal edema, some LUE swelling  Dialysis Access: L UA AVF +bruit      OP DialysisOrders: [facility] TTS 3.5h 58kg 2/2.25 bath Hep none LUA AVF Hec 39mg IV/HD EPO17,000 qwklyHd   104/75   Problem/Plan: 1.Sepsis/ Blood cultures + Achromobacter sp./suspected UTI ( but no UA CS done despite order ) on Meropenem per primary 2. AMS - 2/2 infection and some baseline dementia. Resolving. Appears back to baseline status today.  3. ESRD -HD TTS. Continue on schedule. Next HD 11/19.  4. Anemia -Hgb 8.5 Aranesp 25 mcg given 08/14/18  5.HTN/volume-On amlodipine  Qd/clonidine 0.2 mg transdermal. Amlodipine was dc'd this admit. No volume on exam/At dry weight.  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 2/ phos 4.1  ,use 2.0 ca bath / hold hect  7. Rectal bleed Noted on admit. Hgb stable. FOBT  ,rx per admit  8. Nutrition -3.5 >2.2 Low albumin. Add prostat  9. MM-per [facility] On[facility]-on carfilzomib and dexamethasone 10. L arm swelling - no DVT by duplex but atypical flow in outflow vein. Seen by [facility] today - no further intervention needed.  124  Dispo - looks like plan is for SNF placement dc when medically ready.  Stable for d/c from renal standpoint  OLynnda ChildPA-C [facility] Pager [phone]/18/2019,9:24 AM     Labs: Basic Metabolic Panel: Recent Labs  Lab 08/13/18 0526 08/14/18 1428 08/16/18 1247  NA  132* 133* 132*  K 5.1 4.6 3.5  CL 98 98 98  CO2 22 20* 27  GLUCOSE 60* 85 83  BUN 68* 92* 49*  CREATININE 10.33* 12.26* 7.61*  CALCIUM 8.9 8.9 8.8*  PHOS  --  5.9* 4.1   Liver Function Tests: Recent Labs  Lab 08/12/18 2025 08/14/18 1428 08/16/18 1247  AST 31  --   --   ALT 13  --   --   ALKPHOS 60  --   --   BILITOT 1.1  --   --   PROT 9.7*  --   --   ALBUMIN 3.5 2.6* 2.2*   No results for input(s): LIPASE, AMYLASE in the last 168 hours. No results for input(s): AMMONIA in the last 168 hours. CBC: Recent Labs  Lab 08/12/18 2025  08/13/18 0526 08/14/18 1127 08/16/18 1246 08/17/18 1022  WBC 9.3  --  7.7 13.1* 5.3 5.1  NEUTROABS 8.7*  --   --   --   --   --   HGB 11.3*  --  9.2* 11.2* 8.4* 8.5*  HCT 37.7  --  31.3* 36.8 28.1* 29.7*  MCV 104.1*  --  103.6* 102.8* 103.7* 104.6*  PLT 192   < > 171 185 142* 170   < > = values in this interval not displayed.   Cardiac Enzymes: Recent  Labs  Lab 08/12/18 2101  TROPONINI 0.05*   CBG: No results for input(s): GLUCAP in the last 168 hours.  Studies/Results: Dg Swallowing Func-speech Pathology  Result Date: 08/17/2018 Objective Swallowing Evaluation: Type of Study: MBS-Modified Barium Swallow Study  Patient Details Name: Jolanda Mccann MRN: [id number] Date of Birth: [birth year] Today's Date: 08/17/2018 Time: SLP Start Time (ACUTE ONLY): 1 -SLP Stop Time (ACUTE ONLY): 1155 SLP Time Calculation (min) (ACUTE ONLY): 25 min Past Medical History: Past Medical History: Diagnosis Date . Anxiety  . Closed fracture of right distal femur ([place]) 09/01/2015 . Depression  . ESRD (end stage renal disease) on dialysis [facility])   "TTS; [facility]" (04/24/2017) . GERD (gastroesophageal reflux disease) 04/23/2015 . HCAP (healthcare-associated pneumonia) 04/24/2017 . Heart murmur  . History of blood transfusion   "low HgB when I was going to dialysis center" . Hypertension  . Hypertension associated with end stage renal disease on dialysis 03/11/2014 .  Malnutrition of moderate degree 09/02/2015 . Multiple myeloma ([place]) 03/11/2014 . Patient is Jehovah's Witness   "I'd rather never have any blood transfusions unless absolutely necessary; please check with me 1st". (04/24/2017) . Pneumonia ~ 2016 Past Surgical History: Past Surgical History: Procedure Laterality Date . AV FISTULA PLACEMENT Left  . I&D EXTREMITY Left 02/27/2016  Procedure: ARTHROSCOPIC IRRIGATION AND DEBRIDEMENT EXTREMITY;  Surgeon: Renette Butters, MD;  Location: [facility];  Service: Orthopedics;  Laterality: Left; . TEE WITHOUT CARDIOVERSION N/A 02/29/2016  Procedure: TRANSESOPHAGEAL ECHOCARDIOGRAM (TEE);  Surgeon: Thayer Headings, MD;  Location: [facility] ENDOSCOPY;  Service: Cardiovascular;  Laterality: N/A; HPI: Annalyse Langlais is a 77 y.o. female with PMH significant for ERSD on HD, HTN, pneumonia (2016), GERD, who presented to [facility] (from SNF) on 08/12/18 with fever, AMS, cellulitis. Pt also had recent admission for a fall (07/26/18). Has been seen by ST July 2019 with MBS (recc D3/thin - possible UES dysfunction) and August 2019 (rec reg/thin). CXR showed minimal bibasilar atelectasis, cardiomegaly. Pt pocketing food with RN and expectorating food witnessed by MD.  Subjective: upset about having to leave room for test Assessment / Plan / Recommendation CHL IP CLINICAL IMPRESSIONS 08/17/2018 Clinical Impression Patient presents with a mild oropharyngeal dysphagia. Patient was upsest about having to leave room, but was compliant to complete evaluation. There was no penetration of aspiration with any consistency. Decreased mastication due to only having upper dentures. Swallow triggered at the pyriform sinuses with large cup sip. Recommend Dys 3, thin liquid diet with no straws. Medication to be given in puree. Speech therapy to follow up for diet tolerance. aspiration risk training/compensatory strategies training.  SLP Visit Diagnosis Dysphagia, oropharyngeal phase (R13.12) Attention and concentration deficit  following -- Frontal lobe and executive function deficit following -- Impact on safety and function Mild aspiration risk   CHL IP TREATMENT RECOMMENDATION 08/17/2018 Treatment Recommendations Therapy as outlined in treatment plan below   Prognosis 08/17/2018 Prognosis for Safe Diet Advancement Good Barriers to Reach Goals Cognitive deficits Barriers/Prognosis Comment -- CHL IP DIET RECOMMENDATION 08/17/2018 SLP Diet Recommendations Dysphagia 3 (Mech soft) solids;Thin liquid Liquid Administration via Cup;No straw Medication Administration Whole meds with puree Compensations Minimize environmental distractions;Small sips/bites;Slow rate;Other (Comment) Postural Changes Remain semi-upright after after feeds/meals (Comment);Seated upright at 90 degrees   CHL IP OTHER RECOMMENDATIONS 08/17/2018 Recommended Consults -- Oral Care Recommendations Oral care BID Other Recommendations --   CHL IP FOLLOW UP RECOMMENDATIONS 08/17/2018 Follow up Recommendations Skilled Nursing facility   [facility] IP FREQUENCY AND DURATION 08/17/2018 Speech Therapy Frequency (ACUTE ONLY)  min 1 x/week Treatment Duration 1 week      CHL IP ORAL PHASE 08/17/2018 Oral Phase Impaired Oral - Pudding Teaspoon -- Oral - Pudding Cup -- Oral - Honey Teaspoon -- Oral - Honey Cup -- Oral - Nectar Teaspoon -- Oral - Nectar Cup -- Oral - Nectar Straw -- Oral - Thin Teaspoon -- Oral - Thin Cup -- Oral - Thin Straw -- Oral - Puree -- Oral - Mech Soft -- Oral - Regular -- Oral - Multi-Consistency -- Oral - Pill -- Oral Phase - Comment --  CHL IP PHARYNGEAL PHASE 08/17/2018 Pharyngeal Phase Impaired Pharyngeal- Pudding Teaspoon -- Pharyngeal -- Pharyngeal- Pudding Cup -- Pharyngeal -- Pharyngeal- Honey Teaspoon -- Pharyngeal -- Pharyngeal- Honey Cup -- Pharyngeal -- Pharyngeal- Nectar Teaspoon -- Pharyngeal -- Pharyngeal- Nectar Cup -- Pharyngeal -- Pharyngeal- Nectar Straw -- Pharyngeal -- Pharyngeal- Thin Teaspoon -- Pharyngeal -- Pharyngeal- Thin Cup Delayed swallow  initiation-pyriform sinuses Pharyngeal -- Pharyngeal- Thin Straw -- Pharyngeal -- Pharyngeal- Puree -- Pharyngeal -- Pharyngeal- Mechanical Soft -- Pharyngeal -- Pharyngeal- Regular -- Pharyngeal -- Pharyngeal- Multi-consistency -- Pharyngeal -- Pharyngeal- Pill -- Pharyngeal -- Pharyngeal Comment --  CHL IP CERVICAL ESOPHAGEAL PHASE 08/17/2018 Cervical Esophageal Phase WFL Pudding Teaspoon -- Pudding Cup -- Honey Teaspoon -- Honey Cup -- Nectar Teaspoon -- Nectar Cup -- Nectar Straw -- Thin Teaspoon -- Thin Cup -- Thin Straw -- Puree -- Mechanical Soft -- Regular -- Multi-consistency -- Pill -- Cervical Esophageal Comment -- Charlynne Cousins Ward, MA, CCC-SLP 08/17/2018 12:41 PM              Medications: . sodium chloride 250 mL (08/13/18 1021)  . meropenem (MERREM) IV 500 mg (08/18/18 0854)   . Chlorhexidine Gluconate Cloth  6 each Topical Q0600  . cloNIDine  0.2 mg Transdermal Q Wed  . feeding supplement (PRO-STAT SUGAR FREE 64)  30 mL Oral BID  . valACYclovir  500 mg Oral Q48H

## 2018-08-18 NOTE — Progress Notes (Signed)
PROGRESS NOTE    [name]  PJK:[id number] DOB: 03-11-1941 DOA: 08/12/2018 PCP: Hendricks Limes, MD   Brief Narrative: [name] is a 77 y.o. female with a history of MM, ESRD on HD and hypertension. She presented secondary to altered mental status with clinical scenario concerning for sepsis.   Assessment & Plan:   Active Problems:   Multiple myeloma (HCC)   Hypertension associatd with end stage renal disease on dialysis   Anemia in neoplastic disease   Mood disorder (HCC)   Sepsis ([place])   Hypertension, uncontrolled   Acute lower UTI   Swelling of joint of left wrist   Rectal bleeding   Sepsis Secondary to UTI and achromobacter bacteremia. Empirically treated with vancomycin and ceftriaxone. Blood culture positive for achromobacter species. -Antibiotics below  Achromobacter species bacteremia Likely secondary to UTI (from urinalysis) although no urine culture obtained (ordered twice, patient is ESRD).  -Continue meropenem -ID recommendations: 2 weeks of IV meropenem  UTI Urinalysis suggests possible infection. Urine culture appears to have been ordered but not obtained.  Metabolic encephalopathy Secondary to infection. Appears to be at baseline.  Cellulitis Improved with antibiotics. -Continue abx  ESRD on HD TTS -Nephrology  Essential hypertension -Continue amlodipine and clonidine patch  Dysphagia In setting of acute infection and metabolic encephalopathy -SLP eval/recs: Dysphagia 1 diet, nectar thick  Left arm swelling Upper extremity venous duplex significant for no DVT, but AVF significant for atypical flow of anastomosis/outflow vein. Swelling improved and located mostly around distal forearm -Vascular surgery recommendations: follow-up as needed  Left hand/wrist pain Patient with history of multiple falls. No fracture on x-ray  Multiple myeloma Currently on carfilzomib and dexamethasone per oncologist at [facility] valacyclovir for  prophylaxis  Neurocognitive deficits  Left eyelid laceration S/p sutures. Present on arrival.  Rectal bleeding? No FOBT. Hemoglobin down from admission. Possibly secondary to dilution vs bleeding. Mentioned in admission notes. Hemoglobin stable. -FOBT ordered and still pending  Pressure Injury Documentation: Medial buttocks, POA  DVT prophylaxis: SCDs Code Status:   Code Status: Full Code Family Communication: None at bedside Disposition Plan: Discharge to SNF when medically stable and outpatient antibiotic regimen created.   Consultants:   Nephrology  Procedures:   None  Antimicrobials:  Vancomycin   Ceftriaxone   Subjective: No issues overnight  Objective: Vitals:   08/17/18 2017 08/18/18 0255 08/18/18 0519 08/18/18 0942  BP: 134/90  (!) 154/97 125/82  Pulse: 63  63 (!) 59  Resp: _0 Temp: 98.6 F (37 C)  98.8 F (37.1 C) 98.1 F (36.7 C)  TempSrc:   Oral Oral  SpO2: 100%  100% 99%  Weight:  58 kg      Intake/Output Summary (Last 24 hours) at 08/18/2018 1606 Last data filed at 08/18/2018 1300 Gross per 24 hour  Intake 1060 ml  Output 0 ml  Net 1060 ml   Filed Weights   08/16/18 2118 08/17/18 0412 08/18/18 0255  Weight: 58 kg 58 kg 58 kg    Examination:  General: Well appearing, no distress    Data Reviewed: I have personally reviewed following labs and imaging studies  CBC: Recent Labs  Lab 08/12/18 2025 08/12/18 2044 08/13/18 0526 08/14/18 1127 08/16/18 1246 08/17/18 1022  WBC 9.3  --  7.7 13.1* 5.3 5.1  NEUTROABS 8.7*  --   --   --   --   --   HGB 11.3*  --  9.2* 11.2* 8.4* 8.5*  HCT 37.7  --  31.3* 36.8 28.1* 29.7*  MCV 104.1*  --  103.6* 102.8* 103.7* 104.6*  PLT 192 189 171 185 142* 846   Basic Metabolic Panel: Recent Labs  Lab 08/12/18 2025 08/13/18 0526 08/14/18 1428 08/16/18 1247  NA 133* 132* 133* 132*  K 4.3 5.1 4.6 3.5  CL 94* 98 98 98  CO2 25 22 20* 27  GLUCOSE 92 60* 85 83  BUN 59* 68* 92* 49*    CREATININE 9.67* 10.33* 12.26* 7.61*  CALCIUM 9.9 8.9 8.9 8.8*  PHOS  --   --  5.9* 4.1   GFR: Estimated Creatinine Clearance: 5 mL/min (A) (by C-G formula based on SCr of 7.61 mg/dL (H)). Liver Function Tests: Recent Labs  Lab 08/12/18 2025 08/14/18 1428 08/16/18 1247  AST 31  --   --   ALT 13  --   --   ALKPHOS 60  --   --   BILITOT 1.1  --   --   PROT 9.7*  --   --   ALBUMIN 3.5 2.6* 2.2*   No results for input(s): LIPASE, AMYLASE in the last 168 hours. No results for input(s): AMMONIA in the last 168 hours. Coagulation Profile: Recent Labs  Lab 08/12/18 2044  INR 1.30   Cardiac Enzymes: Recent Labs  Lab 08/12/18 2101  TROPONINI 0.05*   BNP (last 3 results) No results for input(s): PROBNP in the last 8760 hours. HbA1C: No results for input(s): HGBA1C in the last 72 hours. CBG: No results for input(s): GLUCAP in the last 168 hours. Lipid Profile: No results for input(s): CHOL, HDL, LDLCALC, TRIG, CHOLHDL, LDLDIRECT in the last 72 hours. Thyroid Function Tests: No results for input(s): TSH, T4TOTAL, FREET4, T3FREE, THYROIDAB in the last 72 hours. Anemia Panel: No results for input(s): VITAMINB12, FOLATE, FERRITIN, TIBC, IRON, RETICCTPCT in the last 72 hours. Sepsis Labs: Recent Labs  Lab 08/12/18 2037 08/12/18 2228  LATICACIDVEN 2.13* 1.23    Recent Results (from the past 240 hour(s))  Blood Culture (routine x 2)     Status: Abnormal   Collection Time: 08/12/18  8:25 PM  Result Value Ref Range Status   Specimen Description BLOOD RIGHT ANTECUBITAL  Final   Special Requests   Final    BOTTLES DRAWN AEROBIC AND ANAEROBIC Blood Culture results may not be optimal due to an excessive volume of blood received in culture bottles   Culture  Setup Time   Final    AEROBIC BOTTLE ONLY GRAM NEGATIVE RODS CRITICAL VALUE NOTED.  VALUE IS CONSISTENT WITH PREVIOUSLY REPORTED AND CALLED VALUE.    Culture (A)  Final    ACHROMOBACTER SPECIES SUSCEPTIBILITIES PERFORMED  ON PREVIOUS CULTURE WITHIN THE LAST 5 DAYS. Performed at [facility], [place] [address], [place], [place] [address]    Report Status 08/15/2018 FINAL  Final  Blood Culture (routine x 2)     Status: Abnormal   Collection Time: 08/12/18  8:30 PM  Result Value Ref Range Status   Specimen Description BLOOD RIGHT HAND  Final   Special Requests   Final    BOTTLES DRAWN AEROBIC AND ANAEROBIC Blood Culture adequate volume   Culture  Setup Time   Final    AEROBIC BOTTLE ONLY GRAM NEGATIVE RODS CRITICAL RESULT CALLED TO, READ BACK BY AND VERIFIED WITH: Hughie Closs [facility] 08/13/18 2103 JDW Performed at [facility], [place] [address], [place], [place] [address]    Culture ACHROMOBACTER SPECIES (A)  Final   Report Status 08/15/2018  FINAL  Final   Organism ID, Bacteria ACHROMOBACTER SPECIES  Final      Susceptibility   Achromobacter species - MIC*    CEFEPIME 16 INTERMEDIATE Intermediate     CEFAZOLIN >=64 RESISTANT Resistant     GENTAMICIN >=16 RESISTANT Resistant     CIPROFLOXACIN >=4 RESISTANT Resistant     IMIPENEM 4 SENSITIVE Sensitive     TRIMETH/SULFA <=20 SENSITIVE Sensitive     * ACHROMOBACTER SPECIES  Blood Culture ID Panel (Reflexed)     Status: None   Collection Time: 08/12/18  8:30 PM  Result Value Ref Range Status   Enterococcus species NOT DETECTED NOT DETECTED Final   Listeria monocytogenes NOT DETECTED NOT DETECTED Final   Staphylococcus species NOT DETECTED NOT DETECTED Final    Comment: CRITICAL RESULT CALLED TO, READ BACK BY AND VERIFIED WITH: Hughie Closs [facility] 08/13/18 2103 JDW    Staphylococcus aureus (BCID) NOT DETECTED NOT DETECTED Final   Streptococcus species NOT DETECTED NOT DETECTED Final   Streptococcus agalactiae NOT DETECTED NOT DETECTED Final   Streptococcus pneumoniae NOT DETECTED NOT DETECTED Final   Streptococcus pyogenes NOT DETECTED NOT DETECTED Final   Acinetobacter baumannii NOT DETECTED NOT DETECTED Final   Enterobacteriaceae species NOT DETECTED  NOT DETECTED Final   Enterobacter cloacae complex NOT DETECTED NOT DETECTED Final   Escherichia coli NOT DETECTED NOT DETECTED Final   Klebsiella oxytoca NOT DETECTED NOT DETECTED Final   Klebsiella pneumoniae NOT DETECTED NOT DETECTED Final   Proteus species NOT DETECTED NOT DETECTED Final   Serratia marcescens NOT DETECTED NOT DETECTED Final   Haemophilus influenzae NOT DETECTED NOT DETECTED Final   Neisseria meningitidis NOT DETECTED NOT DETECTED Final   Pseudomonas aeruginosa NOT DETECTED NOT DETECTED Final   Candida albicans NOT DETECTED NOT DETECTED Final   Candida glabrata NOT DETECTED NOT DETECTED Final   Candida krusei NOT DETECTED NOT DETECTED Final   Candida parapsilosis NOT DETECTED NOT DETECTED Final   Candida tropicalis NOT DETECTED NOT DETECTED Final    Comment: Performed at [facility], [place] [address], [place], [place] [address]  MRSA PCR Screening     Status: None   Collection Time: 08/13/18 12:52 AM  Result Value Ref Range Status   MRSA by PCR NEGATIVE NEGATIVE Final    Comment:        The GeneXpert MRSA Assay (FDA approved for NASAL specimens only), is one component of a comprehensive MRSA colonization surveillance program. It is not intended to diagnose MRSA infection nor to guide or monitor treatment for MRSA infections. Performed at [facility], [place] [address] [place], [place] [address]          Radiology Studies: Dg Swallowing Func-speech Pathology  Result Date: 08/17/2018 Objective Swallowing Evaluation: Type of Study: MBS-Modified Barium Swallow Study  Patient Details Name: [name] MRN: [id number] Date of Birth: 15-Aug-1941 Today's Date: 08/17/2018 Time: SLP Start Time (ACUTE ONLY): 68 -SLP Stop Time (ACUTE ONLY): 1155 SLP Time Calculation (min) (ACUTE ONLY): 25 min Past Medical History: Past Medical History: Diagnosis Date . Anxiety  . Closed fracture of right distal femur ([place]) 09/01/2015 . Depression  . ESRD (end stage renal  disease) on dialysis [facility])   "TTS; [place]" (04/24/2017) . GERD (gastroesophageal reflux disease) 04/23/2015 . HCAP (healthcare-associated pneumonia) 04/24/2017 . Heart murmur  . History of blood transfusion   "low HgB when I was going to dialysis center" . Hypertension  . Hypertension associated with end stage renal disease on  dialysis 03/11/2014 . Malnutrition of moderate degree 09/02/2015 . Multiple myeloma ([place]) 03/11/2014 . Patient is Jehovah's Witness   "I'd rather never have any blood transfusions unless absolutely necessary; please check with me 1st". (04/24/2017) . Pneumonia ~ 2016 Past Surgical History: Past Surgical History: Procedure Laterality Date . AV FISTULA PLACEMENT Left  . I&D EXTREMITY Left 02/27/2016  Procedure: ARTHROSCOPIC IRRIGATION AND DEBRIDEMENT EXTREMITY;  Surgeon: Renette Butters, MD;  Location: [facility];  Service: Orthopedics;  Laterality: Left; . TEE WITHOUT CARDIOVERSION N/A 02/29/2016  Procedure: TRANSESOPHAGEAL ECHOCARDIOGRAM (TEE);  Surgeon: Thayer Headings, MD;  Location: [facility] ENDOSCOPY;  Service: Cardiovascular;  Laterality: N/A; HPI: [name] is a 77 y.o. female with PMH significant for ERSD on HD, HTN, pneumonia (2016), GERD, who presented to [facility] (from SNF) on 08/12/18 with fever, AMS, cellulitis. Pt also had recent admission for a fall (07/26/18). Has been seen by ST July 2019 with MBS (recc D3/thin - possible UES dysfunction) and August 2019 (rec reg/thin). CXR showed minimal bibasilar atelectasis, cardiomegaly. Pt pocketing food with RN and expectorating food witnessed by MD.  Subjective: upset about having to leave room for test Assessment / Plan / Recommendation CHL IP CLINICAL IMPRESSIONS 08/17/2018 Clinical Impression Patient presents with a mild oropharyngeal dysphagia. Patient was upsest about having to leave room, but was compliant to complete evaluation. There was no penetration of aspiration with any consistency. Decreased mastication due to only having upper dentures.  Swallow triggered at the pyriform sinuses with large cup sip. Recommend Dys 3, thin liquid diet with no straws. Medication to be given in puree. Speech therapy to follow up for diet tolerance. aspiration risk training/compensatory strategies training.  SLP Visit Diagnosis Dysphagia, oropharyngeal phase (R13.12) Attention and concentration deficit following -- Frontal lobe and executive function deficit following -- Impact on safety and function Mild aspiration risk   CHL IP TREATMENT RECOMMENDATION 08/17/2018 Treatment Recommendations Therapy as outlined in treatment plan below   Prognosis 08/17/2018 Prognosis for Safe Diet Advancement Good Barriers to Reach Goals Cognitive deficits Barriers/Prognosis Comment -- CHL IP DIET RECOMMENDATION 08/17/2018 SLP Diet Recommendations Dysphagia 3 (Mech soft) solids;Thin liquid Liquid Administration via Cup;No straw Medication Administration Whole meds with puree Compensations Minimize environmental distractions;Small sips/bites;Slow rate;Other (Comment) Postural Changes Remain semi-upright after after feeds/meals (Comment);Seated upright at 90 degrees   CHL IP OTHER RECOMMENDATIONS 08/17/2018 Recommended Consults -- Oral Care Recommendations Oral care BID Other Recommendations --   CHL IP FOLLOW UP RECOMMENDATIONS 08/17/2018 Follow up Recommendations Skilled Nursing facility   [facility] IP FREQUENCY AND DURATION 08/17/2018 Speech Therapy Frequency (ACUTE ONLY) min 1 x/week Treatment Duration 1 week      CHL IP ORAL PHASE 08/17/2018 Oral Phase Impaired Oral - Pudding Teaspoon -- Oral - Pudding Cup -- Oral - Honey Teaspoon -- Oral - Honey Cup -- Oral - Nectar Teaspoon -- Oral - Nectar Cup -- Oral - Nectar Straw -- Oral - Thin Teaspoon -- Oral - Thin Cup -- Oral - Thin Straw -- Oral - Puree -- Oral - Mech Soft -- Oral - Regular -- Oral - Multi-Consistency -- Oral - Pill -- Oral Phase - Comment --  CHL IP PHARYNGEAL PHASE 08/17/2018 Pharyngeal Phase Impaired Pharyngeal- Pudding Teaspoon  -- Pharyngeal -- Pharyngeal- Pudding Cup -- Pharyngeal -- Pharyngeal- Honey Teaspoon -- Pharyngeal -- Pharyngeal- Honey Cup -- Pharyngeal -- Pharyngeal- Nectar Teaspoon -- Pharyngeal -- Pharyngeal- Nectar Cup -- Pharyngeal -- Pharyngeal- Nectar Straw -- Pharyngeal -- Pharyngeal- Thin Teaspoon -- Pharyngeal -- Pharyngeal- Thin Cup Delayed swallow  initiation-pyriform sinuses Pharyngeal -- Pharyngeal- Thin Straw -- Pharyngeal -- Pharyngeal- Puree -- Pharyngeal -- Pharyngeal- Mechanical Soft -- Pharyngeal -- Pharyngeal- Regular -- Pharyngeal -- Pharyngeal- Multi-consistency -- Pharyngeal -- Pharyngeal- Pill -- Pharyngeal -- Pharyngeal Comment --  CHL IP CERVICAL ESOPHAGEAL PHASE 08/17/2018 Cervical Esophageal Phase WFL Pudding Teaspoon -- Pudding Cup -- Honey Teaspoon -- Honey Cup -- Nectar Teaspoon -- Nectar Cup -- Nectar Straw -- Thin Teaspoon -- Thin Cup -- Thin Straw -- Puree -- Mechanical Soft -- Regular -- Multi-consistency -- Pill -- Cervical Esophageal Comment -- Charlynne Cousins Ward, MA, CCC-SLP 08/17/2018 12:41 PM                   Scheduled Meds: . Chlorhexidine Gluconate Cloth  6 each Topical Q0600  . cloNIDine  0.2 mg Transdermal Q Wed  . feeding supplement (PRO-STAT SUGAR FREE 64)  30 mL Oral BID  . valACYclovir  500 mg Oral Q48H   Continuous Infusions: . sodium chloride 250 mL (08/13/18 1021)  . meropenem (MERREM) IV Stopped (08/18/18 3643)     LOS: 6 days     Cordelia Poche, MD [facility] 08/18/2018, 4:06 PM  If 7PM-7AM, please contact night-coverage [email or web address]

## 2018-08-18 NOTE — Progress Notes (Signed)
[name] for Infectious Disease  Date of Admission:  08/12/2018     Total days of antibiotics 7         ASSESSMENT/PLAN  [name] is on Day 5 of meropenem for Achromobacter bacteremia of unclear source as assessment has not revealed any significant areas of infection. Her mental status has improved and appears to be close to baseline and remains without fever or leukocytosis. Will plan for 2 weeks of total therapy with end date of 11/29 for simple bacteremia.   1. Continue meropenem. 2. Treat for simple bacteremia with 2 weeks of therapy and end date of 11/29.   ID will sign off and be available as needed during her hospitalization stay.   Active Problems:   Multiple myeloma (HCC)   Hypertension associatd with end stage renal disease on dialysis   Anemia in neoplastic disease   Mood disorder (HCC)   Sepsis ([place])   Hypertension, uncontrolled   Acute lower UTI   Swelling of joint of left wrist   Rectal bleeding   . Chlorhexidine Gluconate Cloth  6 each Topical Q0600  . cloNIDine  0.2 mg Transdermal Q Wed  . feeding supplement (PRO-STAT SUGAR FREE 64)  30 mL Oral BID  . valACYclovir  500 mg Oral Q48H    SUBJECTIVE:  Afebrile overnight. Nursing staff reports improvement in mental status over the weekend and now closer to baseline.   Allergies  Allergen Reactions  . Phenergan [Promethazine Hcl] Other (See Comments)    Acute encephalopathy in the context of refusal to go to hemodialysis and administration of Phenergan for nausea and vomiting     Review of Systems: Review of Systems  Constitutional: Negative for chills and fever.  Respiratory: Negative for cough, shortness of breath and wheezing.   Cardiovascular: Negative for chest pain and leg swelling.  Gastrointestinal: Negative for abdominal pain, diarrhea, nausea and vomiting.  Skin: Negative for rash.      OBJECTIVE: Vitals:   08/17/18 2017 08/18/18 0255 08/18/18 0519 08/18/18 0942  BP: 134/90   (!) 154/97 125/82  Pulse: 63  63 (!) 59  Resp: '17  18 18  ' Temp: 98.6 F (37 C)  98.8 F (37.1 C) 98.1 F (36.7 C)  TempSrc:   Oral Oral  SpO2: 100%  100% 99%  Weight:  58 kg     Body mass index is 22.65 kg/m.  Physical Exam  Constitutional: She is oriented to person, place, and time. She appears well-developed and well-nourished. No distress.  Lying in bed with head of bed elevated; pleasant.   Cardiovascular: Normal rate, regular rhythm, normal heart sounds and intact distal pulses.  Fistula with bruit and thrill; no evidence of infection.   Pulmonary/Chest: Effort normal and breath sounds normal.  Neurological: She is alert and oriented to person, place, and time.  Skin: Skin is warm and dry.  Psychiatric: She has a normal mood and affect. Her behavior is normal. Judgment and thought content normal.    Lab Results Lab Results  Component Value Date   WBC 5.1 08/17/2018   HGB 8.5 (L) 08/17/2018   HCT 29.7 (L) 08/17/2018   MCV 104.6 (H) 08/17/2018   PLT 170 08/17/2018    Lab Results  Component Value Date   CREATININE 7.61 (H) 08/16/2018   BUN 49 (H) 08/16/2018   NA 132 (L) 08/16/2018   K 3.5 08/16/2018   CL 98 08/16/2018   CO2 27 08/16/2018    Lab Results  Component  Value Date   ALT 13 08/12/2018   AST 31 08/12/2018   ALKPHOS 60 08/12/2018   BILITOT 1.1 08/12/2018     Microbiology: Recent Results (from the past 240 hour(s))  Blood Culture (routine x 2)     Status: Abnormal   Collection Time: 08/12/18  8:25 PM  Result Value Ref Range Status   Specimen Description BLOOD RIGHT ANTECUBITAL  Final   Special Requests   Final    BOTTLES DRAWN AEROBIC AND ANAEROBIC Blood Culture results may not be optimal due to an excessive volume of blood received in culture bottles   Culture  Setup Time   Final    AEROBIC BOTTLE ONLY GRAM NEGATIVE RODS CRITICAL VALUE NOTED.  VALUE IS CONSISTENT WITH PREVIOUSLY REPORTED AND CALLED VALUE.    Culture (A)  Final    ACHROMOBACTER  SPECIES SUSCEPTIBILITIES PERFORMED ON PREVIOUS CULTURE WITHIN THE LAST 5 DAYS. Performed at [facility], [place] [address], [place], [place] [address]    Report Status 08/15/2018 FINAL  Final  Blood Culture (routine x 2)     Status: Abnormal   Collection Time: 08/12/18  8:30 PM  Result Value Ref Range Status   Specimen Description BLOOD RIGHT HAND  Final   Special Requests   Final    BOTTLES DRAWN AEROBIC AND ANAEROBIC Blood Culture adequate volume   Culture  Setup Time   Final    AEROBIC BOTTLE ONLY GRAM NEGATIVE RODS CRITICAL RESULT CALLED TO, READ BACK BY AND VERIFIED WITH: [name] [facility] 08/13/18 2103 JDW Performed at [facility], [place] [address], [place], [place] [address]    Culture ACHROMOBACTER SPECIES (A)  Final   Report Status 08/15/2018 FINAL  Final   Organism ID, Bacteria ACHROMOBACTER SPECIES  Final      Susceptibility   Achromobacter species - MIC*    CEFEPIME 16 INTERMEDIATE Intermediate     CEFAZOLIN >=64 RESISTANT Resistant     GENTAMICIN >=16 RESISTANT Resistant     CIPROFLOXACIN >=4 RESISTANT Resistant     IMIPENEM 4 SENSITIVE Sensitive     TRIMETH/SULFA <=20 SENSITIVE Sensitive     * ACHROMOBACTER SPECIES  Blood Culture ID Panel (Reflexed)     Status: None   Collection Time: 08/12/18  8:30 PM  Result Value Ref Range Status   Enterococcus species NOT DETECTED NOT DETECTED Final   Listeria monocytogenes NOT DETECTED NOT DETECTED Final   Staphylococcus species NOT DETECTED NOT DETECTED Final    Comment: CRITICAL RESULT CALLED TO, READ BACK BY AND VERIFIED WITH: [name] [facility] 08/13/18 2103 JDW    Staphylococcus aureus (BCID) NOT DETECTED NOT DETECTED Final   Streptococcus species NOT DETECTED NOT DETECTED Final   Streptococcus agalactiae NOT DETECTED NOT DETECTED Final   Streptococcus pneumoniae NOT DETECTED NOT DETECTED Final   Streptococcus pyogenes NOT DETECTED NOT DETECTED Final   Acinetobacter baumannii NOT DETECTED NOT DETECTED Final    Enterobacteriaceae species NOT DETECTED NOT DETECTED Final   Enterobacter cloacae complex NOT DETECTED NOT DETECTED Final   Escherichia coli NOT DETECTED NOT DETECTED Final   Klebsiella oxytoca NOT DETECTED NOT DETECTED Final   Klebsiella pneumoniae NOT DETECTED NOT DETECTED Final   Proteus species NOT DETECTED NOT DETECTED Final   Serratia marcescens NOT DETECTED NOT DETECTED Final   Haemophilus influenzae NOT DETECTED NOT DETECTED Final   Neisseria meningitidis NOT DETECTED NOT DETECTED Final   Pseudomonas aeruginosa NOT DETECTED NOT DETECTED Final   Candida albicans NOT DETECTED NOT DETECTED  Final   Candida glabrata NOT DETECTED NOT DETECTED Final   Candida krusei NOT DETECTED NOT DETECTED Final   Candida parapsilosis NOT DETECTED NOT DETECTED Final   Candida tropicalis NOT DETECTED NOT DETECTED Final    Comment: Performed at [facility], [place] [address], [place], [place] [address]  MRSA PCR Screening     Status: None   Collection Time: 08/13/18 12:52 AM  Result Value Ref Range Status   MRSA by PCR NEGATIVE NEGATIVE Final    Comment:        The GeneXpert MRSA Assay (FDA approved for NASAL specimens only), is one component of a comprehensive MRSA colonization surveillance program. It is not intended to diagnose MRSA infection nor to guide or monitor treatment for MRSA infections. Performed at [facility], [place] [address] [place], [place] [address]      Terri Piedra, [place] [facility] [phone] Pager  08/18/2018  1:03 PM

## 2018-08-19 LAB — CBC
HCT: 28.8 % — ABNORMAL LOW (ref 36.0–46.0)
HEMOGLOBIN: 8.4 g/dL — AB (ref 12.0–15.0)
MCH: 30 pg (ref 26.0–34.0)
MCHC: 29.2 g/dL — AB (ref 30.0–36.0)
MCV: 102.9 fL — ABNORMAL HIGH (ref 80.0–100.0)
NRBC: 0 % (ref 0.0–0.2)
Platelets: 209 10*3/uL (ref 150–400)
RBC: 2.8 MIL/uL — ABNORMAL LOW (ref 3.87–5.11)
RDW: 16.4 % — ABNORMAL HIGH (ref 11.5–15.5)
WBC: 5.2 10*3/uL (ref 4.0–10.5)

## 2018-08-19 LAB — RENAL FUNCTION PANEL
ALBUMIN: 2.2 g/dL — AB (ref 3.5–5.0)
ANION GAP: 10 (ref 5–15)
BUN: 52 mg/dL — ABNORMAL HIGH (ref 8–23)
CALCIUM: 8.8 mg/dL — AB (ref 8.9–10.3)
CO2: 23 mmol/L (ref 22–32)
Chloride: 95 mmol/L — ABNORMAL LOW (ref 98–111)
Creatinine, Ser: 8.13 mg/dL — ABNORMAL HIGH (ref 0.44–1.00)
GFR, EST AFRICAN AMERICAN: 5 mL/min — AB (ref >60)
GFR, EST NON AFRICAN AMERICAN: 4 mL/min — AB (ref >60)
GLUCOSE: 82 mg/dL (ref 70–99)
PHOSPHORUS: 5.7 mg/dL — AB (ref 2.5–4.6)
POTASSIUM: 3.5 mmol/L (ref 3.5–5.1)
SODIUM: 128 mmol/L — AB (ref 135–145)

## 2018-08-19 MED ORDER — HEPARIN SODIUM (PORCINE) 1000 UNIT/ML DIALYSIS: 1000.0000 [IU] | INTRAMUSCULAR | Status: DC | PRN

## 2018-08-19 MED ORDER — LIDOCAINE HCL (PF) 1 % IJ SOLN: 5.0000 mL | INTRAMUSCULAR | Status: DC | PRN

## 2018-08-19 MED ORDER — SODIUM CHLORIDE 0.9 % IV SOLN: 100.0000 mL | INTRAVENOUS | Status: DC | PRN

## 2018-08-19 MED ORDER — PENTAFLUOROPROP-TETRAFLUOROETH EX AERO: 1.0000 "application " | INHALATION_SPRAY | CUTANEOUS | Status: DC | PRN

## 2018-08-19 MED ORDER — LIDOCAINE-PRILOCAINE 2.5-2.5 % EX CREA: 1.0000 "application " | TOPICAL_CREAM | CUTANEOUS | Status: DC | PRN

## 2018-08-19 NOTE — Progress Notes (Signed)
PT Cancellation Note  Patient Details Name: [name] MRN: [id number] DOB: 1941/09/05   Cancelled Treatment:    Reason Eval/Treat Not Completed: Fatigue/lethargy limiting ability to participate. Pt reports she is worn out from HD earlier today. Pt very emotional and tearful regarding family matters. Will continue to follow and initiate PT eval when pt is agreeable.    Thelma Comp 08/19/2018, 2:23 PM   Rolinda Roan, PT, DPT Acute Rehabilitation Services Pager: [phone] Office: [phone]

## 2018-08-19 NOTE — Progress Notes (Signed)
PHARMACY CONSULT NOTE FOR:  OUTPATIENT  PARENTERAL ANTIBIOTIC THERAPY (OPAT)  Indication: Achromobacter Bacteremia  Regimen: Meropenem 500 mg IV every 24 hours End date: 08/29/18  IV antibiotic discharge orders are pended. To discharging provider:  please sign these orders via discharge navigator,  Select New Orders & click on the button choice - Manage This Unsigned Work.     Thank you for allowing pharmacy to be a part of this patient's care.  Alycia Rossetti, PharmD, BCPS Pager: [phone] 8:02 AM

## 2018-08-19 NOTE — Progress Notes (Signed)
  [facility] Progress Note   Assessment/ Plan:   OP [facility]ialysis[facility] 3[facility]5h 58kg 2/2.25 bath Hep none LUA AVF Hec 86mcg IV/HD EPO17,000 qwklyHd    Assessment/Plan: 1.Sepsis/ Blood cultures + Achromobacter sp./suspected UTI( but no UA CS done despite order ) on Meropenem per primary and ID.  Plan for 2 week course.   2. Acute encephalopathy - 2/2 infection and some baseline dementia. Resolving. Appears back to baseline status. 3. ESRD -HD TTS. Continue on schedule. Next HD today 11/19.  4. Anemia -Hgb 8.5Aranesp 25 mcg given 08/14/18, not due for redosing yet. 5.HTN/volume-On amlodipine  Qd/clonidine 0.2 mg transdermal. Amlodipine was dc'd this admit. No volume on exam/At dry weight.  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 2/ phos 4.1  ,use 2.0 ca bath / holding hectoral for now 7. Rectal bleed Noted on admit. Hgb stable. Per primary 8. Nutrition -3.5 >2.2Low albumin. Added prostat  9. 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 L arm swelling - no DVT by duplex but atypical flow in outflow vein. Seen by [facility] 11/18- no further intervention needed.  65.  Dispo - looks like plan is for SNF placement dc when medically ready.  Stable for d/c from renal standpoint  Subjective:    Alert and oriented.  States "I'm coming back to myself".     Objective:   BP (!) 141/85   Pulse (!) 59   Temp 98.7 F (37.1 C) (Oral)   Resp 18   Wt 60.6 kg   SpO2 97%   BMI 23.67 kg/m   Physical Exam: General:NAD, pleasant and talkative HEENT: 2cm lac on L eyebrow, sutured  Heart:RRR, no m/r/g Lungs:CTA anteriorly Abdomen:Soft, nondistended Extremities:No pedal edema, trace UE edema  Dialysis Access:L UA AVF +bruit    Labs: BMET Recent Labs  Lab 08/12/18 2025 08/13/18 0526 08/14/18 1428 08/16/18 1247 08/19/18 0724  NA 133* 132* 133* 132* 128*  K 4.3 5.1 4.6 3.5 3.5  CL 94* 98 98 98 95*  CO2 25 22 20* 27 23   GLUCOSE 92 60* 85 83 82  BUN 59* 68* 92* 49* 52*  CREATININE 9.67* 10.33* 12.26* 7.61* 8.13*  CALCIUM 9.9 8.9 8.9 8.8* 8.8*  PHOS  --   --  5.9* 4.1 5.7*   CBC Recent Labs  Lab 08/12/18 2025  08/14/18 1127 08/16/18 1246 08/17/18 1022 08/19/18 0724  WBC 9.3   < > 13.1* 5.3 5.1 5.2  NEUTROABS 8.7*  --   --   --   --   --   HGB 11.3*   < > 11.2* 8.4* 8.5* 8.4*  HCT 37.7   < > 36.8 28.1* 29.7* 28.8*  MCV 104.1*   < > 102.8* 103.7* 104.6* 102.9*  PLT 192   < > 185 142* 170 209   < > = values in this interval not displayed.    @IMGRELPRIORS @ Medications:    . Chlorhexidine Gluconate Cloth  6 each Topical Q0600  . cloNIDine  0.2 mg Transdermal Q Wed  . feeding supplement (PRO-STAT SUGAR FREE 64)  30 mL Oral BID  . valACYclovir  500 mg Oral Q48H     Madelon Lips, MD [facility] pgr [phone] 08/19/2018, 9:42 AM

## 2018-08-19 NOTE — Care Management Note (Signed)
Case Management Note  Patient Details  Name: [name] MRN: [id number] Date of Birth: 1941/02/08  Subjective/Objective:   Sepsis s/t UTI and achromobacter bacteremia, metabolic encephalopathy, cellulitis, ESRD on HD                Action/Plan: Pt scheduled dc to SNF with IV abx. Contacted [facility] and spoke to [facility]. States they can manage IV abx with a peripheral line. Notified attending. Made CSW aware. Plan is dc back to [facility] with IV abx until 08/29/2018.  Expected Discharge Date:             Expected Discharge Plan:  Skilled Nursing Facility  In-House Referral:  Clinical Social Work  Discharge planning Services  CM Consult  Post Acute Care Choice:  NA Choice offered to:  NA  DME Arranged:  N/A DME Agency:  NA  HH Arranged:  NA HH Agency:  NA  Status of Service:  Completed, signed off  If discussed at [place] of Stay Meetings, dates discussed:    Additional Comments:  Erenest Rasher, RN 08/19/2018, 10:10 AM

## 2018-08-19 NOTE — Progress Notes (Signed)
PROGRESS NOTE    [name]  DJS:[id number] DOB: 1940-10-11 DOA: 08/12/2018 PCP: Hendricks Limes, MD   Brief Narrative: [name] is a 77 y.o. female with a history of MM, ESRD on HD and hypertension. She presented secondary to altered mental status with clinical scenario concerning for sepsis.   Assessment & Plan:   Active Problems:   Multiple myeloma (HCC)   Hypertension associatd with end stage renal disease on dialysis   Anemia in neoplastic disease   Mood disorder (HCC)   Sepsis ([place])   Hypertension, uncontrolled   Acute lower UTI   Swelling of joint of left wrist   Rectal bleeding   Bacteremia   Sepsis Secondary to UTI and achromobacter bacteremia. Empirically treated with vancomycin and ceftriaxone. Blood culture positive for achromobacter species. -Antibiotics below  Achromobacter species bacteremia Likely secondary to UTI (from urinalysis) although no urine culture obtained (ordered twice, patient is ESRD).  -Continue meropenem -ID recommendations: 2 weeks of IV meropenem -Patient cannot get PICC secondary to ESRD. CM to check if SNF will be able to manage PIV and antibiotics  UTI Urinalysis suggests possible infection. Urine culture appears to have been ordered but not obtained.  Metabolic encephalopathy Secondary to infection. Appears to be at baseline.  Cellulitis On leg. Resolved with antibiotics. -Continue abx  ESRD on HD TTS -Nephrology  Essential hypertension -Continue amlodipine and clonidine patch  Dysphagia In setting of acute infection and metabolic encephalopathy -SLP eval/recs: Dysphagia 3 diet, thin liquids, no straw  Left arm swelling Upper extremity venous duplex significant for no DVT, but AVF significant for atypical flow of anastomosis/outflow vein. Swelling improved and located mostly around distal forearm -Vascular surgery recommendations: follow-up outpatient as needed  Left hand/wrist pain Patient with history of  multiple falls. No fracture on x-ray  Multiple myeloma Currently on carfilzomib and dexamethasone per oncologist at [facility] valacyclovir for prophylaxis  Neurocognitive deficits  Left eyelid laceration S/p sutures. Present on arrival. -order placed for suture removal  Rectal bleeding? No FOBT. Hemoglobin down from admission. Possibly secondary to dilution vs bleeding. Mentioned in admission notes. Hemoglobin stable. -FOBT ordered and still pending  Pressure Injury Documentation: Medial buttocks, POA  DVT prophylaxis: SCDs Code Status:   Code Status: Full Code Family Communication: None at bedside Disposition Plan: Discharge to SNF when antibiotics course completed.   Consultants:   Nephrology  Infectious disease  Procedures:   HD, TTS  Antimicrobials:  Vancomycin (11/13)  Ceftriaxone (11/13)  Cefepime (11/12>>11/15)  Meropenem (11/15>>   Subjective: Feet cold. No other concerns.  Objective: Vitals:   08/19/18 0830 08/19/18 0900 08/19/18 0930 08/19/18 1000  BP: 130/84 133/87 (!) 141/85 138/82  Pulse: (!) 58 (!) 58 (!) 59 60  Resp:      Temp:      TempSrc:      SpO2:      Weight:        Intake/Output Summary (Last 24 hours) at 08/19/2018 1055 Last data filed at 08/19/2018 0600 Gross per 24 hour  Intake 640 ml  Output 0 ml  Net 640 ml   Filed Weights   08/18/18 0255 08/18/18 2320 08/19/18 0700  Weight: 58 kg 50.9 kg 60.6 kg    Examination:  General exam: Appears calm and comfortable Respiratory system: Clear to auscultation. Respiratory effort normal. Cardiovascular system: S1 & S2 heard, RRR. No murmurs, rubs, gallops or clicks. Gastrointestinal system: Abdomen is nondistended, soft and nontender. No organomegaly or masses felt. Normal bowel sounds heard. Central  nervous system: Alert and oriented. No focal neurological deficits. Extremities: Left wrist edema No calf tenderness Skin: No cyanosis. No rashes Psychiatry: Judgement  and insight appear normal. Mood & affect appropriate.    Data Reviewed: I have personally reviewed following labs and imaging studies  CBC: Recent Labs  Lab 08/12/18 2025  08/13/18 0526 08/14/18 1127 08/16/18 1246 08/17/18 1022 08/19/18 0724  WBC 9.3  --  7.7 13.1* 5.3 5.1 5.2  NEUTROABS 8.7*  --   --   --   --   --   --   HGB 11.3*  --  9.2* 11.2* 8.4* 8.5* 8.4*  HCT 37.7  --  31.3* 36.8 28.1* 29.7* 28.8*  MCV 104.1*  --  103.6* 102.8* 103.7* 104.6* 102.9*  PLT 192   < > 171 185 142* 170 209   < > = values in this interval not displayed.   Basic Metabolic Panel: Recent Labs  Lab 08/12/18 2025 08/13/18 0526 08/14/18 1428 08/16/18 1247 08/19/18 0724  NA 133* 132* 133* 132* 128*  K 4.3 5.1 4.6 3.5 3.5  CL 94* 98 98 98 95*  CO2 25 22 20* 27 23  GLUCOSE 92 60* 85 83 82  BUN 59* 68* 92* 49* 52*  CREATININE 9.67* 10.33* 12.26* 7.61* 8.13*  CALCIUM 9.9 8.9 8.9 8.8* 8.8*  PHOS  --   --  5.9* 4.1 5.7*   GFR: Estimated Creatinine Clearance: 4.6 mL/min (A) (by C-G formula based on SCr of 8.13 mg/dL (H)). Liver Function Tests: Recent Labs  Lab 08/12/18 2025 08/14/18 1428 08/16/18 1247 08/19/18 0724  AST 31  --   --   --   ALT 13  --   --   --   ALKPHOS 60  --   --   --   BILITOT 1.1  --   --   --   PROT 9.7*  --   --   --   ALBUMIN 3.5 2.6* 2.2* 2.2*   No results for input(s): LIPASE, AMYLASE in the last 168 hours. No results for input(s): AMMONIA in the last 168 hours. Coagulation Profile: Recent Labs  Lab 08/12/18 2044  INR 1.30   Cardiac Enzymes: Recent Labs  Lab 08/12/18 2101  TROPONINI 0.05*   BNP (last 3 results) No results for input(s): PROBNP in the last 8760 hours. HbA1C: No results for input(s): HGBA1C in the last 72 hours. CBG: No results for input(s): GLUCAP in the last 168 hours. Lipid Profile: No results for input(s): CHOL, HDL, LDLCALC, TRIG, CHOLHDL, LDLDIRECT in the last 72 hours. Thyroid Function Tests: No results for input(s): TSH,  T4TOTAL, FREET4, T3FREE, THYROIDAB in the last 72 hours. Anemia Panel: No results for input(s): VITAMINB12, FOLATE, FERRITIN, TIBC, IRON, RETICCTPCT in the last 72 hours. Sepsis Labs: Recent Labs  Lab 08/12/18 2037 08/12/18 2228  LATICACIDVEN 2.13* 1.23    Recent Results (from the past 240 hour(s))  Blood Culture (routine x 2)     Status: Abnormal   Collection Time: 08/12/18  8:25 PM  Result Value Ref Range Status   Specimen Description BLOOD RIGHT ANTECUBITAL  Final   Special Requests   Final    BOTTLES DRAWN AEROBIC AND ANAEROBIC Blood Culture results may not be optimal due to an excessive volume of blood received in culture bottles   Culture  Setup Time   Final    AEROBIC BOTTLE ONLY GRAM NEGATIVE RODS CRITICAL VALUE NOTED.  VALUE IS CONSISTENT WITH PREVIOUSLY REPORTED AND CALLED VALUE.  Culture (A)  Final    ACHROMOBACTER SPECIES SUSCEPTIBILITIES PERFORMED ON PREVIOUS CULTURE WITHIN THE LAST 5 DAYS. Performed at [facility], [place] [address] [place], [place] [address]    Report Status 08/15/2018 FINAL  Final  Blood Culture (routine x 2)     Status: Abnormal   Collection Time: 08/12/18  8:30 PM  Result Value Ref Range Status   Specimen Description BLOOD RIGHT HAND  Final   Special Requests   Final    BOTTLES DRAWN AEROBIC AND ANAEROBIC Blood Culture adequate volume   Culture  Setup Time   Final    AEROBIC BOTTLE ONLY GRAM NEGATIVE RODS CRITICAL RESULT CALLED TO, READ BACK BY AND VERIFIED WITH: Hughie Closs [facility] 08/13/18 2103 JDW Performed at [facility], [place] [address], [place], [place] [address]    Culture ACHROMOBACTER SPECIES (A)  Final   Report Status 08/15/2018 FINAL  Final   Organism ID, Bacteria ACHROMOBACTER SPECIES  Final      Susceptibility   Achromobacter species - MIC*    CEFEPIME 16 INTERMEDIATE Intermediate     CEFAZOLIN >=64 RESISTANT Resistant     GENTAMICIN >=16 RESISTANT Resistant     CIPROFLOXACIN >=4 RESISTANT Resistant     IMIPENEM  4 SENSITIVE Sensitive     TRIMETH/SULFA <=20 SENSITIVE Sensitive     * ACHROMOBACTER SPECIES  Blood Culture ID Panel (Reflexed)     Status: None   Collection Time: 08/12/18  8:30 PM  Result Value Ref Range Status   Enterococcus species NOT DETECTED NOT DETECTED Final   Listeria monocytogenes NOT DETECTED NOT DETECTED Final   Staphylococcus species NOT DETECTED NOT DETECTED Final    Comment: CRITICAL RESULT CALLED TO, READ BACK BY AND VERIFIED WITH: Hughie Closs [facility] 08/13/18 2103 JDW    Staphylococcus aureus (BCID) NOT DETECTED NOT DETECTED Final   Streptococcus species NOT DETECTED NOT DETECTED Final   Streptococcus agalactiae NOT DETECTED NOT DETECTED Final   Streptococcus pneumoniae NOT DETECTED NOT DETECTED Final   Streptococcus pyogenes NOT DETECTED NOT DETECTED Final   Acinetobacter baumannii NOT DETECTED NOT DETECTED Final   Enterobacteriaceae species NOT DETECTED NOT DETECTED Final   Enterobacter cloacae complex NOT DETECTED NOT DETECTED Final   Escherichia coli NOT DETECTED NOT DETECTED Final   Klebsiella oxytoca NOT DETECTED NOT DETECTED Final   Klebsiella pneumoniae NOT DETECTED NOT DETECTED Final   Proteus species NOT DETECTED NOT DETECTED Final   Serratia marcescens NOT DETECTED NOT DETECTED Final   Haemophilus influenzae NOT DETECTED NOT DETECTED Final   Neisseria meningitidis NOT DETECTED NOT DETECTED Final   Pseudomonas aeruginosa NOT DETECTED NOT DETECTED Final   Candida albicans NOT DETECTED NOT DETECTED Final   Candida glabrata NOT DETECTED NOT DETECTED Final   Candida krusei NOT DETECTED NOT DETECTED Final   Candida parapsilosis NOT DETECTED NOT DETECTED Final   Candida tropicalis NOT DETECTED NOT DETECTED Final    Comment: Performed at [facility], [place]. [address], [place], [place] [address]  MRSA PCR Screening     Status: None   Collection Time: 08/13/18 12:52 AM  Result Value Ref Range Status   MRSA by PCR NEGATIVE NEGATIVE Final    Comment:          The GeneXpert MRSA Assay (FDA approved for NASAL specimens only), is one component of a comprehensive MRSA colonization surveillance program. It is not intended to diagnose MRSA infection nor to guide or monitor treatment for MRSA infections. Performed at  [facility], [place] [address], [place], [place] [address]          Radiology Studies: Dg Swallowing Func-speech Pathology  Result Date: 08/17/2018 Objective Swallowing Evaluation: Type of Study: MBS-Modified Barium Swallow Study  Patient Details Name: Emmerson Shuffield MRN: [id number] Date of Birth: 02-03-1941 Today's Date: 08/17/2018 Time: SLP Start Time (ACUTE ONLY): 67 -SLP Stop Time (ACUTE ONLY): 1155 SLP Time Calculation (min) (ACUTE ONLY): 25 min Past Medical History: Past Medical History: Diagnosis Date . Anxiety  . Closed fracture of right distal femur ([place]) 09/01/2015 . Depression  . ESRD (end stage renal disease) on dialysis [facility])   "TTS; [facility]" (04/24/2017) . GERD (gastroesophageal reflux disease) 04/23/2015 . HCAP (healthcare-associated pneumonia) 04/24/2017 . Heart murmur  . History of blood transfusion   "low HgB when I was going to dialysis center" . Hypertension  . Hypertension associated with end stage renal disease on dialysis 03/11/2014 . Malnutrition of moderate degree 09/02/2015 . Multiple myeloma ([place]) 03/11/2014 . Patient is Jehovah's Witness   "I'd rather never have any blood transfusions unless absolutely necessary; please check with me 1st". (04/24/2017) . Pneumonia ~ 2016 Past Surgical History: Past Surgical History: Procedure Laterality Date . AV FISTULA PLACEMENT Left  . I&D EXTREMITY Left 02/27/2016  Procedure: ARTHROSCOPIC IRRIGATION AND DEBRIDEMENT EXTREMITY;  Surgeon: Renette Butters, MD;  Location: [place];  Service: Orthopedics;  Laterality: Left; . TEE WITHOUT CARDIOVERSION N/A 02/29/2016  Procedure: TRANSESOPHAGEAL ECHOCARDIOGRAM (TEE);  Surgeon: Thayer Headings, MD;  Location[facility] ENDOSCOPY;  Service:  Cardiovascular;  Laterality: N/A; HPI: Arleatha Philipps is a 77 y.o. female with PMH significant for ERSD on HD, HTN, pneumonia (2016), GERD, who presented to [facility] (from SNF) on 08/12/18 with fever, AMS, cellulitis. Pt also had recent admission for a fall (07/26/18). Has been seen by ST July 2019 with MBS (recc D3/thin - possible UES dysfunction) and August 2019 (rec reg/thin). CXR showed minimal bibasilar atelectasis, cardiomegaly. Pt pocketing food with RN and expectorating food witnessed by MD.  Subjective: upset about having to leave room for test Assessment / Plan / Recommendation CHL IP CLINICAL IMPRESSIONS 08/17/2018 Clinical Impression Patient presents with a mild oropharyngeal dysphagia. Patient was upsest about having to leave room, but was compliant to complete evaluation. There was no penetration of aspiration with any consistency. Decreased mastication due to only having upper dentures. Swallow triggered at the pyriform sinuses with large cup sip. Recommend Dys 3, thin liquid diet with no straws. Medication to be given in puree. Speech therapy to follow up for diet tolerance. aspiration risk training/compensatory strategies training.  SLP Visit Diagnosis Dysphagia, oropharyngeal phase (R13.12) Attention and concentration deficit following -- Frontal lobe and executive function deficit following -- Impact on safety and function Mild aspiration risk   CHL IP TREATMENT RECOMMENDATION 08/17/2018 Treatment Recommendations Therapy as outlined in treatment plan below   Prognosis 08/17/2018 Prognosis for Safe Diet Advancement Good Barriers to Reach Goals Cognitive deficits Barriers/Prognosis Comment -- CHL IP DIET RECOMMENDATION 08/17/2018 SLP Diet Recommendations Dysphagia 3 (Mech soft) solids;Thin liquid Liquid Administration via Cup;No straw Medication Administration Whole meds with puree Compensations Minimize environmental distractions;Small sips/bites;Slow rate;Other (Comment) Postural Changes Remain  semi-upright after after feeds/meals (Comment);Seated upright at 90 degrees   CHL IP OTHER RECOMMENDATIONS 08/17/2018 Recommended Consults -- Oral Care Recommendations Oral care BID Other Recommendations --   CHL IP FOLLOW UP RECOMMENDATIONS 08/17/2018 Follow up Recommendations Skilled Nursing facility   [facility] IP FREQUENCY AND DURATION 08/17/2018 Speech Therapy Frequency (ACUTE ONLY) min  1 x/week Treatment Duration 1 week      CHL IP ORAL PHASE 08/17/2018 Oral Phase Impaired Oral - Pudding Teaspoon -- Oral - Pudding Cup -- Oral - Honey Teaspoon -- Oral - Honey Cup -- Oral - Nectar Teaspoon -- Oral - Nectar Cup -- Oral - Nectar Straw -- Oral - Thin Teaspoon -- Oral - Thin Cup -- Oral - Thin Straw -- Oral - Puree -- Oral - Mech Soft -- Oral - Regular -- Oral - Multi-Consistency -- Oral - Pill -- Oral Phase - Comment --  CHL IP PHARYNGEAL PHASE 08/17/2018 Pharyngeal Phase Impaired Pharyngeal- Pudding Teaspoon -- Pharyngeal -- Pharyngeal- Pudding Cup -- Pharyngeal -- Pharyngeal- Honey Teaspoon -- Pharyngeal -- Pharyngeal- Honey Cup -- Pharyngeal -- Pharyngeal- Nectar Teaspoon -- Pharyngeal -- Pharyngeal- Nectar Cup -- Pharyngeal -- Pharyngeal- Nectar Straw -- Pharyngeal -- Pharyngeal- Thin Teaspoon -- Pharyngeal -- Pharyngeal- Thin Cup Delayed swallow initiation-pyriform sinuses Pharyngeal -- Pharyngeal- Thin Straw -- Pharyngeal -- Pharyngeal- Puree -- Pharyngeal -- Pharyngeal- Mechanical Soft -- Pharyngeal -- Pharyngeal- Regular -- Pharyngeal -- Pharyngeal- Multi-consistency -- Pharyngeal -- Pharyngeal- Pill -- Pharyngeal -- Pharyngeal Comment --  CHL IP CERVICAL ESOPHAGEAL PHASE 08/17/2018 Cervical Esophageal Phase WFL Pudding Teaspoon -- Pudding Cup -- Honey Teaspoon -- Honey Cup -- Nectar Teaspoon -- Nectar Cup -- Nectar Straw -- Thin Teaspoon -- Thin Cup -- Thin Straw -- Puree -- Mechanical Soft -- Regular -- Multi-consistency -- Pill -- Cervical Esophageal Comment -- Charlynne Cousins [place], MA, CCC-SLP 08/17/2018 12:41 PM                    Scheduled Meds: . Chlorhexidine Gluconate Cloth  6 each Topical Q0600  . cloNIDine  0.2 mg Transdermal Q Wed  . feeding supplement (PRO-STAT SUGAR FREE 64)  30 mL Oral BID  . valACYclovir  500 mg Oral Q48H   Continuous Infusions: . sodium chloride Stopped (08/18/18 1930)  . sodium chloride    . sodium chloride    . meropenem (MERREM) IV Stopped (08/18/18 6226)     LOS: 7 days     Cordelia Poche, MD [facility] 08/19/2018, 10:55 AM  If 7PM-7AM, please contact night-coverage [email or web address]

## 2018-08-19 NOTE — Care Management Important Message (Signed)
Important Message  Patient Details  Name: [name] MRN: [id number] Date of Birth: 1941-01-04   Medicare Important Message Given:  Yes    Erenest Rasher, RN 08/19/2018, 2:30 PM

## 2018-08-19 NOTE — Progress Notes (Signed)
  Speech Language Pathology Treatment: Dysphagia  Patient Details Name: [name] MRN: [id number] DOB: 11-19-1940 Today's Date: 08/19/2018 Time: 8485-9276 SLP Time Calculation (min) (ACUTE ONLY): 15 min  Assessment / Plan / Recommendation Clinical Impression  Patient recently returned from dialysis. She was not ready to eat, but was willing to eat a few bites of chicken and dumplings and tea. NO signs or symptoms of aspiration. No changes in vocal quality or respiratory status. CNA reported patient tolerated breakfast this morning. Patient has met all dysphagia goals. Speech therapy to sign off at this time.    HPI HPI: [name] is a 77 y.o. female with PMH significant for ERSD on HD, HTN, pneumonia (2016), GERD, who presented to [facility] (from SNF) on 08/12/18 with fever, AMS, cellulitis. Pt also had recent admission for a fall (07/26/18). Has been seen by ST July 2019 with MBS (recc D3/thin - possible UES dysfunction) and August 2019 (rec reg/thin). CXR showed minimal bibasilar atelectasis, cardiomegaly. Pt pocketing food with RN and expectorating food witnessed by MD.      SLP Plan  All goals met       Recommendations  Diet recommendations: Dysphagia 3 (mechanical soft);Thin liquid Liquids provided via: Cup;No straw Medication Administration: Crushed with puree Supervision: Patient able to self feed;Intermittent supervision to cue for compensatory strategies Compensations: Minimize environmental distractions;Small sips/bites;Slow rate;Other (Comment) Postural Changes and/or Swallow Maneuvers: Seated upright 90 degrees                Plan: All goals met       [place], MA, CCC-SLP 08/19/2018 12:42 PM

## 2018-08-19 NOTE — Procedures (Signed)
Patient seen and examined on Hemodialysis. QB 400 mL/ min via AVF UF goal 2L Tolerating rx well.  Treatment adjusted as needed.  Madelon Lips MD [facility] pgr [phone] 9:46 AM

## 2018-08-20 DIAGNOSIS — D630 Anemia in neoplastic disease: Secondary | ICD-10-CM

## 2018-08-20 DIAGNOSIS — N390 Urinary tract infection, site not specified: Secondary | ICD-10-CM

## 2018-08-20 DIAGNOSIS — K921 Melena: Secondary | ICD-10-CM

## 2018-08-20 MED ORDER — MEROPENEM IV (FOR PTA / DISCHARGE USE ONLY): 500.0000 mg | INTRAVENOUS | 0 refills | Status: DC

## 2018-08-20 NOTE — Clinical Social Work Note (Signed)
Patient medically stable for discharge and is returning to [facility]. Daughter R. Cawley [phone]) contacted and message left for her to contact CSW. Admissions Director Daleen Snook at [facility] contacted, informed and discharge clinicals transmitted to facility. Nurse provided with information to call report (patient going to room 109) and transport arranged. CSW signing off as no other SW intervention services needed.  Jelisa  Givens, MSW, LCSW Licensed Clinical Social Worker [place] [phone]

## 2018-08-20 NOTE — Plan of Care (Signed)
Pt discharging back to [facility], and care will continue there, IV antibiotics to continue until 11/29.

## 2018-08-20 NOTE — Progress Notes (Signed)
Subjective:  im going home , no co   Objective Vital signs in last 24 hours: Vitals:   08/19/18 1754 08/19/18 2143 08/20/18 0446 08/20/18 0836  BP: (!) 167/93 (!) 163/91 (!) 158/91 (!) 157/88  Pulse: (!) 57 (!) 59 62 61  Resp: 18 16 18 18   Temp: 97.9 F (36.6 C) 98.7 F (37.1 C) 100.2 F (37.9 C) 100.2 F (37.9 C)  TempSrc: Oral Oral Oral Oral  SpO2: 100% 100% 98% 99%  Weight:       Weight change: 7.4 kg  Physical Exam: General:NAD, pleasant OX3  Heart:RRR, no m/r/g Lungs:CTA anteriorly Abdomen:Soft, NT, ND Extremities:No pedal edema, trace UE edema Dialysis Access:L UA AVF +bruit   [facility] 3.5h 58kg 2/2.25 bath Hep none LUA AVF Hec 61mcg IV/HD EPO17,000 qwklyHd   Problem/Plan:  1.Sepsis/ Blood cultures + Achromobacter sp./suspected UTI( but no UA CS done despite order ) on Meropenem per primary and ID.  Plan for 2 week course.   2. Acute encephalopathy - 2/2 infectionandsome baseline dementia. Resolved now at baseline . 3. ESRD -HD TTS. Continue on schedule. . 4. Anemia -Hgb8.4Aranesp25 mcg given 08/14/18, not due for redosing yet. 5.HTN/volume-On amlodipine Qd/clonidine 0.2 mg transdermal. Amlodipine was dc'd this admit. No volume on exam/At dry weight post hd yest   No changes in EDW  6.MBD-OnHectorol/Renvela binder corec ca 10. 2/ phos 4.1 ,use 2.0 ca bath / holding hectoral for now 7. Rectal bleed Noted on admit. Hgb stable. Per primary 8. Nutrition -3.5 >2.2Low albumin. Added prostat  9. 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 L arm swelling - no DVT by duplex but atypical flow in outflow vein. Seen by [facility] 11/18- no further intervention needed. 9. Dispo - looks like plan is for SNF placement dc when medically ready. Stable for d/c from renal standpoint   Ernest Haber, PA-C [place] [phone] 08/20/2018,1:06 PM  LOS: 8 days   Labs: Basic Metabolic  Panel: Recent Labs  Lab 08/14/18 1428 08/16/18 1247 08/19/18 0724  NA 133* 132* 128*  K 4.6 3.5 3.5  CL 98 98 95*  CO2 20* 27 23  GLUCOSE 85 83 82  BUN 92* 49* 52*  CREATININE 12.26* 7.61* 8.13*  CALCIUM 8.9 8.8* 8.8*  PHOS 5.9* 4.1 5.7*   Liver Function Tests: Recent Labs  Lab 08/14/18 1428 08/16/18 1247 08/19/18 0724  ALBUMIN 2.6* 2.2* 2.2*   No results for input(s): LIPASE, AMYLASE in the last 168 hours. No results for input(s): AMMONIA in the last 168 hours. CBC: Recent Labs  Lab 08/14/18 1127 08/16/18 1246 08/17/18 1022 08/19/18 0724  WBC 13.1* 5.3 5.1 5.2  HGB 11.2* 8.4* 8.5* 8.4*  HCT 36.8 28.1* 29.7* 28.8*  MCV 102.8* 103.7* 104.6* 102.9*  PLT 185 142* 170 209   Cardiac Enzymes: No results for input(s): CKTOTAL, CKMB, CKMBINDEX, TROPONINI in the last 168 hours. CBG: No results for input(s): GLUCAP in the last 168 hours.  Studies/Results: No results found. Medications: . sodium chloride Stopped (08/18/18 1930)  . meropenem (MERREM) IV 500 mg (08/19/18 1801)   . Chlorhexidine Gluconate Cloth  6 each Topical Q0600  . cloNIDine  0.2 mg Transdermal Q Wed  . feeding supplement (PRO-STAT SUGAR FREE 64)  30 mL Oral BID  . valACYclovir  500 mg Oral Q48H

## 2018-08-20 NOTE — Progress Notes (Signed)
Called report to receiving RN at [facility].  Discussed IV antibiotic being given prior to patient discharge.  Should continue at facility beginning tomorrow.

## 2018-08-20 NOTE — Discharge Summary (Signed)
Physician Discharge Summary   Patient ID: [name] MRN: [id number] DOB/AGE: 79-Sep-1942 79 y.o.  Admit date: 08/12/2018 Discharge date: 08/20/2018  Primary Care Physician:  Carrie Limes, MD   Recommendations for Outpatient Follow-up:  1. Follow up with PCP in 1-2 weeks 2. Please obtain BMP/CBC in one week  3. Patient will continue IV meropenem till 08/29/2018  Home Health: Patient is being discharged to skilled nursing facility Equipment/Devices:   Discharge Condition: stable * CODE STATUS: FULL Diet recommendation: Dysphagia 1 diet, pured with nectar thick liquids, fluid restriction 1200 cc / 24 hours   Discharge Diagnoses:    . Sepsis ([name])    Achromobacter species bacteremia    UTI Acute metabolic encephalopathy Cellulitis resolved ESRD on hemodialysis, TTS Dysphagia Left arm swelling Left hand/wrist pain . Mood disorder ([name]) . Multiple myeloma ([name]) . Anemia in neoplastic disease . Hypertension, uncontrolled   Consults: Nephrology Infectious disease    Allergies:   Allergies  Allergen Reactions  . Phenergan [Promethazine Hcl] Other (See Comments)    Acute encephalopathy in the context of refusal to go to hemodialysis and administration of Phenergan for nausea and vomiting     DISCHARGE MEDICATIONS: Allergies as of 08/20/2018      Reactions   Phenergan [promethazine Hcl] Other (See Comments)   Acute encephalopathy in the context of refusal to go to hemodialysis and administration of Phenergan for nausea and vomiting      Medication List    STOP taking these medications   LORazepam 0.5 MG tablet Commonly known as:  ATIVAN   losartan 50 MG tablet Commonly known as:  COZAAR   sertraline 50 MG tablet Commonly known as:  ZOLOFT     TAKE these medications   acetaminophen 325 MG tablet Commonly known as:  TYLENOL Take 650 mg by mouth every 6 (six) hours as needed for fever or headache (pain). Do not exceed 3000 mg in 24 hours    amLODipine 10 MG tablet Commonly known as:  NORVASC Take 10 mg by mouth every evening. Hold for SBP <100MM/HG   bisacodyl 10 MG suppository Commonly known as:  DULCOLAX Place 10 mg rectally daily as needed for moderate constipation (constipation not relieved by MOM).   calcium acetate 667 MG capsule Commonly known as:  PHOSLO Take 1,334 mg by mouth 3 (three) times daily with meals.   cloNIDine 0.1 MG tablet Commonly known as:  CATAPRES Take 0.1 mg by mouth every 8 (eight) hours as needed (sbp >180).   cloNIDine 0.2 mg/24hr patch Commonly known as:  CATAPRES - Dosed in mg/24 hr Place 1 patch (0.2 mg total) onto the skin once a week. What changed:  when to take this   FLEET ENEMA RE Place 1 enema rectally daily as needed (constipation not relieved by bisacodyl suppository).   hydrocortisone cream 1 % Apply 1 application topically See admin instructions. Apply to rectum as needed for hemorrhoids   isosorbide mononitrate 60 MG 24 hr tablet Commonly known as:  IMDUR Take 60 mg by mouth daily.   lamoTRIgine 25 MG tablet Commonly known as:  LAMICTAL Take 50 mg by mouth daily.   latanoprost 0.005 % ophthalmic solution Commonly known as:  XALATAN Place 1 drop into both eyes at bedtime.   loperamide 2 MG tablet Commonly known as:  IMODIUM A-D Take 4 mg by mouth as needed for diarrhea or loose stools.   meropenem  IVPB Commonly known as:  MERREM Inject 500 mg into the vein daily for  10 days. Indication:  Achromobacter Bacteremia Last Day of Therapy:  08/29/18 Labs - Once weekly:  CBC/D and BMP, Labs - Every other week:  ESR and CRP   multivitamin Tabs tablet Take 1 tablet by mouth daily.   ondansetron 4 MG tablet Commonly known as:  ZOFRAN Take 4 mg by mouth every 8 (eight) hours as needed for nausea or vomiting.   ranitidine 300 MG tablet Commonly known as:  ZANTAC Take 300 mg by mouth at bedtime. Reported on 03/05/2016   saccharomyces boulardii 250 MG  capsule Commonly known as:  FLORASTOR Take 250 mg by mouth 2 (two) times daily as needed (loose stools).   SENEXON-S 8.6-50 MG tablet Generic drug:  senna-docusate Take 2 tablets by mouth at bedtime as needed (constipation).   sevelamer carbonate 800 MG tablet Commonly known as:  RENVELA Take 2 tablets (1,600 mg total) by mouth 3 (three) times daily with meals.   simethicone 125 MG chewable tablet Commonly known as:  MYLICON Chew 497 mg by mouth daily at 6 PM.   valACYclovir 500 MG tablet Commonly known as:  VALTREX Take 1 tablet (500 mg total) by mouth every other day.            Home Infusion Instuctions  (From admission, onward)         Start     Ordered   08/20/18 0000  Home infusion instructions [facility] May follow [place] Dosing Protocol; May administer Cathflo as needed to maintain patency of vascular access device.; Flushing of vascular access device: per [facility] Protocol: 0.9% NaCl pre/post medica...    Question Answer Comment  Instructions May follow [place] [facility] Dosing Protocol   Instructions May administer Cathflo as needed to maintain patency of vascular access device.   Instructions Flushing of vascular access device: per [facility] Protocol: 0.9% NaCl pre/post medication administration and prn patency; Heparin 100 u/ml, 52m for implanted ports and Heparin 10u/ml, 557mfor all other central venous catheters.   Instructions May follow [facility] Anaphylaxis Protocol for First Dose Administration in the home: 0.9% NaCl at 25-50 ml/hr to maintain IV access for protocol meds. Epinephrine 0.3 ml IV/IM PRN and Benadryl 25-50 IV/IM PRN s/s of anaphylaxis.   Instructions [facility] Infusion Coordinator (RN) to assist per patient IV care needs in the home PRN.      08/20/18 1055           Brief H and P: For complete details please refer to admission H and P, but in brief **BeDomino Holtens a [age].o. female with a history of MM, ESRD on HD and hypertension. She  presented secondary to altered mental status with clinical scenario concerning for sepsis.   Hospital Course:   Sepsis: Secondary to UTI and bacteremia Sepsis physiology now resolved   Acromobacter bacteremia likely secondary to UTI -ID recommended 2 weeks of IV meropenem -Continue till 1102/63/7858 Acute metabolic encephalopathy Likely due to #1 and #2, improving  Cellulitis on the leg, resolved   ESRD on hemodialysis, TTS Continue HD  Essential hypertension Currently stable, continue clonidine patch and amlodipine  Dysphagia In the setting of acute infection, metabolic encephalopathy.  Seen by SLP, recommended dysphagia 3 diet with thin liquids   Left arm swelling Venous duplex negative for DVT.  Patient was seen by vascular surgery, recommended outpatient follow-up.  Left hand/wrist pain -No fractures on the x-ray  Multiple myeloma Continue carfilzomib, dexamethasone  Left eye lid laceration -POA, status post sutures  Pressure injury Medial buttocks, POA, wound care per nursing  Day of Discharge S no acute complaints, wants to be discharged soon  BP (!) 157/88 (BP Location: Right Arm)   Pulse 61   Temp 100.2 F (37.9 C) (Oral)   Resp 18   Wt 58.3 kg   SpO2 99%   BMI 22.77 kg/m   Physical Exam: General: Alert and awake oriented, not in any acute distress. HEENT: anicteric sclera, pupils reactive to light and accommodation CVS: S1-S2 clear no murmur rubs or gallops Chest: clear to auscultation bilaterally, no wheezing rales or rhonchi Abdomen: soft nontender, nondistended, normal bowel sounds Extremities: no cyanosis, clubbing or edema noted bilaterally Neuro: Cranial nerves II-XII intact, no focal neurological deficits   The results of significant diagnostics from this hospitalization (including imaging, microbiology, ancillary and laboratory) are listed below for reference.      Procedures/Studies:  Dg Wrist 2 Views Left  Result  Date: 08/14/2018 CLINICAL DATA:  Left wrist and hand pain. EXAM: LEFT WRIST - 2 VIEW COMPARISON:  08/12/2018. FINDINGS: Diffuse osteopenia degenerative change. Erosive changes again noted about the distal scaphoid and proximal trapezium. Again possibly related to prior surgery and or erosive arthritis. Clinical correlation suggested. No evidence of fracture dislocation. Diffuse soft tissue swelling. No radiopaque foreign body. Peripheral vascular calcification. IMPRESSION: 1. Diffuse soft tissue swelling. No evidence of fracture dislocation. 2. Erosive changes are again noted about the distal scaphoid and proximal trapezium. Again this could be rib possibly related to prior surgery and or erosive arthritis. Similar findings noted on prior exam. 3.  Peripheral vascular disease. Electronically Signed   By: Marcello Moores  Register   On: 08/14/2018 12:46   Dg Wrist Complete Left  Result Date: 08/12/2018 CLINICAL DATA:  Acute onset of left wrist swelling and erythema. Initial encounter. EXAM: LEFT WRIST - COMPLETE 3+ VIEW COMPARISON:  None. FINDINGS: There is no evidence of fracture or dislocation. There appears to be large erosions of the distal scaphoid and proximal trapezium and trapezoid. This may reflect prior surgery, or possibly erosive arthritis. Would correlate with the patient's history. Scattered vascular calcifications are seen. IMPRESSION: 1. No evidence of fracture or dislocation. 2. Apparent large erosions of the distal scaphoid and proximal trapezium and trapezoid. This may reflect prior surgery, or possibly erosive arthritis. Would correlate with the patient's history. 3. Scattered vascular calcifications seen. Electronically Signed   By: Garald Balding M.D.   On: 08/12/2018 21:29   Ct Head Wo Contrast  Result Date: 07/26/2018 CLINICAL DATA:  Unwitnessed fall. Suspicion for head injury or cervical spine injury. EXAM: CT HEAD WITHOUT CONTRAST CT CERVICAL SPINE WITHOUT CONTRAST TECHNIQUE: Multidetector  CT imaging of the head and cervical spine was performed following the standard protocol without intravenous contrast. Multiplanar CT image reconstructions of the cervical spine were also generated. COMPARISON:  05/27/2018 and older exams. FINDINGS: CT HEAD FINDINGS Brain: No evidence of acute infarction, hemorrhage, hydrocephalus, extra-axial collection or mass lesion/mass effect. Mild stable atrophy. Patchy areas of white matter hypoattenuation consistent with mild chronic microvascular ischemic change. Vascular: No hyperdense vessel or unexpected calcification. Skull: No skull fracture. There are scattered well-defined skull lucencies stable from the prior study. These are consistent with multiple myeloma. Sinuses/Orbits: Visualized globes and orbits are unremarkable. Visualized sinuses and mastoid air cells are clear. Other: None. CT CERVICAL SPINE FINDINGS Alignment: Normal. Skull base and vertebrae: No fracture. Multiple lucent bone lesions consistent with multiple myeloma, stable from a cervical CT dated 04/10/2015. Soft tissues and spinal canal:  No epidural mass or hematoma. No soft tissue mass or inflammation. Disc levels: Moderate loss of disc height throughout the cervical spine from C3-C4 through C6-C7. Mild spondylotic disc bulging with endplate spurring. No convincing disc herniation. Upper chest: No acute findings.  Clear lung apices. Other: None. IMPRESSION: HEAD CT 1. No acute intracranial abnormalities. 2. Skull lesions consistent with multiple myeloma stable from the prior exam. CERVICAL CT 1. No fracture or acute finding. 2. Bone lesions consistent with multiple myeloma without significant change from the prior cervical spine CT. Electronically Signed   By: Lajean Manes M.D.   On: 07/26/2018 20:30   Ct Cervical Spine Wo Contrast  Result Date: 07/26/2018 CLINICAL DATA:  Unwitnessed fall. Suspicion for head injury or cervical spine injury. EXAM: CT HEAD WITHOUT CONTRAST CT CERVICAL SPINE  WITHOUT CONTRAST TECHNIQUE: Multidetector CT imaging of the head and cervical spine was performed following the standard protocol without intravenous contrast. Multiplanar CT image reconstructions of the cervical spine were also generated. COMPARISON:  05/27/2018 and older exams. FINDINGS: CT HEAD FINDINGS Brain: No evidence of acute infarction, hemorrhage, hydrocephalus, extra-axial collection or mass lesion/mass effect. Mild stable atrophy. Patchy areas of white matter hypoattenuation consistent with mild chronic microvascular ischemic change. Vascular: No hyperdense vessel or unexpected calcification. Skull: No skull fracture. There are scattered well-defined skull lucencies stable from the prior study. These are consistent with multiple myeloma. Sinuses/Orbits: Visualized globes and orbits are unremarkable. Visualized sinuses and mastoid air cells are clear. Other: None. CT CERVICAL SPINE FINDINGS Alignment: Normal. Skull base and vertebrae: No fracture. Multiple lucent bone lesions consistent with multiple myeloma, stable from a cervical CT dated 04/10/2015. Soft tissues and spinal canal: No epidural mass or hematoma. No soft tissue mass or inflammation. Disc levels: Moderate loss of disc height throughout the cervical spine from C3-C4 through C6-C7. Mild spondylotic disc bulging with endplate spurring. No convincing disc herniation. Upper chest: No acute findings.  Clear lung apices. Other: None. IMPRESSION: HEAD CT 1. No acute intracranial abnormalities. 2. Skull lesions consistent with multiple myeloma stable from the prior exam. CERVICAL CT 1. No fracture or acute finding. 2. Bone lesions consistent with multiple myeloma without significant change from the prior cervical spine CT. Electronically Signed   By: Lajean Manes M.D.   On: 07/26/2018 20:30   Dg Hand 2 View Left  Result Date: 08/14/2018 CLINICAL DATA:  Left wrist and hand pain. EXAM: LEFT HAND - 2 VIEW COMPARISON:  08/12/2018. FINDINGS:  Diffuse soft tissue swelling. No radiopaque foreign body. Again noted erosive changes about the distal scaphoid and proximal trapezium. This could be related to prior surgery. Erosive arthropathy could present this fashion. Similar findings noted on prior exam. No evidence of fracture or dislocation. IMPRESSION: 1. Diffuse soft tissue swelling. No radiopaque foreign body. No evidence of fracture dislocation. 2. Again noted are erosive changes about the distal scaphoid and proximal trapezium. This could be related to prior surgery. Erosive arthropathy including infectious arthropathy could also present in this fashion. Similar findings noted on prior exam Electronically Signed   By: Marcello Moores  Register   On: 08/14/2018 12:48   Dg Chest Carrie 1 View  Result Date: 08/12/2018 CLINICAL DATA:  Acute onset of altered mental status. Status post dialysis. EXAM: PORTABLE CHEST 1 VIEW COMPARISON:  Chest radiograph performed 05/27/2018 FINDINGS: The lungs are well-aerated. Minimal bibasilar atelectasis is noted. There is no evidence of pleural effusion or pneumothorax. The cardiomediastinal silhouette is enlarged. No acute osseous abnormalities are seen. Clips are seen  overlying the left axilla. IMPRESSION: 1. Minimal bibasilar atelectasis noted; lungs otherwise clear. 2. Cardiomegaly. Electronically Signed   By: Garald Balding M.D.   On: 08/12/2018 21:27   Dg Shoulder Left  Result Date: 07/26/2018 CLINICAL DATA:  Unwitnessed fall.  Pain. EXAM: LEFT SHOULDER - 2+ VIEW COMPARISON:  02/13/2018 FINDINGS: Tortuous ectatic thoracic aorta. The adjacent ribs and lung are nonacute. Chronic stable appearance of the humeral head without fracture. Osteoarthritis of the AC glenohumeral joints. Vascular clips project over the included left arm. IMPRESSION: Negative for acute fracture or malalignment of the left shoulder. Electronically Signed   By: Ashley Royalty M.D.   On: 07/26/2018 21:02   Dg Swallowing Func-speech  Pathology  Result Date: 08/17/2018 Objective Swallowing Evaluation: Type of Study: MBS-Modified Barium Swallow Study  Patient Details Name: [name] MRN: [id number] Date of Birth: 06-Nov-1940 Today's Date: 08/17/2018 Time: SLP Start Time (ACUTE ONLY): 24 -SLP Stop Time (ACUTE ONLY): 1155 SLP Time Calculation (min) (ACUTE ONLY): 25 min Past Medical History: Past Medical History: Diagnosis Date . Anxiety  . Closed fracture of right distal femur ([place]) 09/01/2015 . Depression  . ESRD (end stage renal disease) on dialysis [facility])   "TTS; [place]" (04/24/2017) . GERD (gastroesophageal reflux disease) 04/23/2015 . HCAP (healthcare-associated pneumonia) 04/24/2017 . Heart murmur  . History of blood transfusion   "low HgB when I was going to dialysis center" . Hypertension  . Hypertension associated with end stage renal disease on dialysis 03/11/2014 . Malnutrition of moderate degree 09/02/2015 . Multiple myeloma ([place]) 03/11/2014 . Patient is Jehovah's Witness   "I'd rather never have any blood transfusions unless absolutely necessary; please check with me 1st". (04/24/2017) . Pneumonia ~ 2016 Past Surgical History: Past Surgical History: Procedure Laterality Date . AV FISTULA PLACEMENT Left  . I&D EXTREMITY Left 02/27/2016  Procedure: ARTHROSCOPIC IRRIGATION AND DEBRIDEMENT EXTREMITY;  Surgeon: Renette Butters, MD;  Location: [facility];  Service: Orthopedics;  Laterality: Left; . TEE WITHOUT CARDIOVERSION N/A 02/29/2016  Procedure: TRANSESOPHAGEAL ECHOCARDIOGRAM (TEE);  Surgeon: Thayer Headings, MD;  Location: [facility] ENDOSCOPY;  Service: Cardiovascular;  Laterality: N/A; HPI: Jacoria Keiffer is a 77 y.o. female with PMH significant for ERSD on HD, HTN, pneumonia (2016), GERD, who presented to [facility] (from SNF) on 08/12/18 with fever, AMS, cellulitis. Pt also had recent admission for a fall (07/26/18). Has been seen by ST July 2019 with MBS (recc D3/thin - possible UES dysfunction) and August 2019 (rec reg/thin). CXR showed minimal  bibasilar atelectasis, cardiomegaly. Pt pocketing food with RN and expectorating food witnessed by MD.  Subjective: upset about having to leave room for test Assessment / Plan / Recommendation CHL IP CLINICAL IMPRESSIONS 08/17/2018 Clinical Impression Patient presents with a mild oropharyngeal dysphagia. Patient was upsest about having to leave room, but was compliant to complete evaluation. There was no penetration of aspiration with any consistency. Decreased mastication due to only having upper dentures. Swallow triggered at the pyriform sinuses with large cup sip. Recommend Dys 3, thin liquid diet with no straws. Medication to be given in puree. Speech therapy to follow up for diet tolerance. aspiration risk training/compensatory strategies training.  SLP Visit Diagnosis Dysphagia, oropharyngeal phase (R13.12) Attention and concentration deficit following -- Frontal lobe and executive function deficit following -- Impact on safety and function Mild aspiration risk   CHL IP TREATMENT RECOMMENDATION 08/17/2018 Treatment Recommendations Therapy as outlined in treatment plan below   Prognosis 08/17/2018 Prognosis for Safe Diet Advancement Good Barriers to Reach Goals Cognitive deficits  Barriers/Prognosis Comment -- CHL IP DIET RECOMMENDATION 08/17/2018 SLP Diet Recommendations Dysphagia 3 (Mech soft) solids;Thin liquid Liquid Administration via Cup;No straw Medication Administration Whole meds with puree Compensations Minimize environmental distractions;Small sips/bites;Slow rate;Other (Comment) Postural Changes Remain semi-upright after after feeds/meals (Comment);Seated upright at 90 degrees   CHL IP OTHER RECOMMENDATIONS 08/17/2018 Recommended Consults -- Oral Care Recommendations Oral care BID Other Recommendations --   CHL IP FOLLOW UP RECOMMENDATIONS 08/17/2018 Follow up Recommendations Skilled Nursing facility   [facility] IP FREQUENCY AND DURATION 08/17/2018 Speech Therapy Frequency (ACUTE ONLY) min 1 x/week  Treatment Duration 1 week      CHL IP ORAL PHASE 08/17/2018 Oral Phase Impaired Oral - Pudding Teaspoon -- Oral - Pudding Cup -- Oral - Honey Teaspoon -- Oral - Honey Cup -- Oral - Nectar Teaspoon -- Oral - Nectar Cup -- Oral - Nectar Straw -- Oral - Thin Teaspoon -- Oral - Thin Cup -- Oral - Thin Straw -- Oral - Puree -- Oral - Mech Soft -- Oral - Regular -- Oral - Multi-Consistency -- Oral - Pill -- Oral Phase - Comment --  CHL IP PHARYNGEAL PHASE 08/17/2018 Pharyngeal Phase Impaired Pharyngeal- Pudding Teaspoon -- Pharyngeal -- Pharyngeal- Pudding Cup -- Pharyngeal -- Pharyngeal- Honey Teaspoon -- Pharyngeal -- Pharyngeal- Honey Cup -- Pharyngeal -- Pharyngeal- Nectar Teaspoon -- Pharyngeal -- Pharyngeal- Nectar Cup -- Pharyngeal -- Pharyngeal- Nectar Straw -- Pharyngeal -- Pharyngeal- Thin Teaspoon -- Pharyngeal -- Pharyngeal- Thin Cup Delayed swallow initiation-pyriform sinuses Pharyngeal -- Pharyngeal- Thin Straw -- Pharyngeal -- Pharyngeal- Puree -- Pharyngeal -- Pharyngeal- Mechanical Soft -- Pharyngeal -- Pharyngeal- Regular -- Pharyngeal -- Pharyngeal- Multi-consistency -- Pharyngeal -- Pharyngeal- Pill -- Pharyngeal -- Pharyngeal Comment --  CHL IP CERVICAL ESOPHAGEAL PHASE 08/17/2018 Cervical Esophageal Phase WFL Pudding Teaspoon -- Pudding Cup -- Honey Teaspoon -- Honey Cup -- Nectar Teaspoon -- Nectar Cup -- Nectar Straw -- Thin Teaspoon -- Thin Cup -- Thin Straw -- Puree -- Mechanical Soft -- Regular -- Multi-consistency -- Pill -- Cervical Esophageal Comment -- Charlynne Cousins Ward, MA, CCC-SLP 08/17/2018 12:41 PM              Vas US Duplex Dialysis Access (avf, Avg)  Result Date: 08/14/2018 DIALYSIS ACCESS Reason for Exam: Swelling. Performing Technologist: Maudry Mayhew MHA, RDMS, RVT, RDCS  Examination Guidelines: A complete evaluation includes B-mode imaging, spectral Doppler, color Doppler, and power Doppler as needed of all accessible portions of each vessel. Unilateral testing is considered  an integral part of a complete examination. Limited examinations for reoccurring indications may be performed as noted.  Findings: +--------------------+----------+-----------------+--------+ AVF                 PSV (cm/s)Flow Vol (mL/min)Comments +--------------------+----------+-----------------+--------+ Native artery inflow   707                              +--------------------+----------+-----------------+--------+ AVF Anastomosis        488                              +--------------------+----------+-----------------+--------+  +------------+----------+-------------+----------+--------+ OUTFLOW VEINPSV (cm/s)Diameter (cm)Depth (cm)Describe +------------+----------+-------------+----------+--------+ Mid UA          40                                    +------------+----------+-------------+----------+--------+   Summary:  Atypical flow is demonstrated in the anastomosis and in the outflow vein; not consistent with normal fistula flow. Anastomosis is stenotic.  *See table(s) above for measurements and observations.  Diagnosing physician: Monica Martinez MD Electronically signed by Monica Martinez MD on 08/14/2018 at 4:36:24 PM.   --------------------------------------------------------------------------------   Final    Vas [place] Upper Extremity Venous Duplex  Result Date: 08/14/2018 UPPER VENOUS STUDY  Indications: Swelling Limitations: Poor patient cooperation. Performing Technologist: Abram Sander  Examination Guidelines: A complete evaluation includes B-mode imaging, spectral Doppler, color Doppler, and power Doppler as needed of all accessible portions of each vessel. Bilateral testing is considered an integral part of a complete examination. Limited examinations for reoccurring indications may be performed as noted.  Right Findings: +----------+------------+----------+---------+-----------+-------------------+ RIGHT     CompressiblePropertiesPhasicitySpontaneous       Summary       +----------+------------+----------+---------+-----------+-------------------+ Subclavian                                          unable to visualize +----------+------------+----------+---------+-----------+-------------------+  Left Findings: +----------+------------+----------+---------+-----------+-------------------+ LEFT      CompressiblePropertiesPhasicitySpontaneous      Summary       +----------+------------+----------+---------+-----------+-------------------+ IJV           Full                 Yes       Yes                        +----------+------------+----------+---------+-----------+-------------------+ Subclavian    Full                 Yes       Yes                        +----------+------------+----------+---------+-----------+-------------------+ Axillary      Full                 Yes       Yes                        +----------+------------+----------+---------+-----------+-------------------+ Brachial      Full                                                      +----------+------------+----------+---------+-----------+-------------------+ Radial        Full                                                      +----------+------------+----------+---------+-----------+-------------------+ Ulnar                                               unable to visualize +----------+------------+----------+---------+-----------+-------------------+ Cephalic      Full                                                      +----------+------------+----------+---------+-----------+-------------------+  Basilic                                             unable to visualize +----------+------------+----------+---------+-----------+-------------------+  Summary:  Left: No evidence of deep vein thrombosis in the upper extremity. No evidence of superficial vein thrombosis in the upper extremity.  *See table(s) above for  measurements and observations.  Diagnosing physician: Monica Martinez MD Electronically signed by Monica Martinez MD on 08/14/2018 at 4:35:58 PM.    Final       LAB RESULTS: Basic Metabolic Panel: Recent Labs  Lab 08/16/18 1247 08/19/18 0724  NA 132* 128*  K 3.5 3.5  CL 98 95*  CO2 27 23  GLUCOSE 83 82  BUN 49* 52*  CREATININE 7.61* 8.13*  CALCIUM 8.8* 8.8*  PHOS 4.1 5.7*   Liver Function Tests: Recent Labs  Lab 08/16/18 1247 08/19/18 0724  ALBUMIN 2.2* 2.2*   No results for input(s): LIPASE, AMYLASE in the last 168 hours. No results for input(s): AMMONIA in the last 168 hours. CBC: Recent Labs  Lab 08/17/18 1022 08/19/18 0724  WBC 5.1 5.2  HGB 8.5* 8.4*  HCT 29.7* 28.8*  MCV 104.6* 102.9*  PLT 170 209   Cardiac Enzymes: No results for input(s): CKTOTAL, CKMB, CKMBINDEX, TROPONINI in the last 168 hours. BNP: Invalid input(s): POCBNP CBG: No results for input(s): GLUCAP in the last 168 hours.    Disposition and Follow-up: Discharge Instructions    Home infusion instructions [facility] May follow [facility] Dosing Protocol; May administer Cathflo as needed to maintain patency of vascular access device.; Flushing of vascular access device: per [facility] Protocol: 0.9% NaCl pre/post medica...   Complete by:  As directed    Instructions:  May follow [facility] Dosing Protocol   Instructions:  May administer Cathflo as needed to maintain patency of vascular access device.   Instructions:  Flushing of vascular access device: per [facility] Protocol: 0.9% NaCl pre/post medication administration and prn patency; Heparin 100 u/ml, 57m for implanted ports and Heparin 10u/ml, 578mfor all other central venous catheters.   Instructions:  May follow [facility] Anaphylaxis Protocol for First Dose Administration in the home: 0.9% NaCl at 25-50 ml/hr to maintain IV access for protocol meds. Epinephrine 0.3 ml IV/IM PRN and Benadryl 25-50 IV/IM PRN s/s of anaphylaxis.    Instructions:  [facility] Coordinator (RN) to assist per patient IV care needs in the home PRN.       DISPOSITION: [place]  HoHendricks LimesMD. Schedule an appointment as soon as possible for a visit in 2 week(s).   Specialty:  Internal Medicine Contact information: [phone]          Time coordinating discharge:  35 minutes  Signed:   RiEstill Cotta.D. [facility] 08/20/2018, 12:12 PM Pager: [phone]

## 2018-08-20 NOTE — Progress Notes (Signed)
Pt discharging and returning  to SNF, [place]. Copy of instructions printed and sent with transporters for facility.  Pt d/c'd with belongings, transported by [place].  Pt was discharged with her peripheral IV NSL to continue IV antibiotics at the SNF until 11/29.

## 2018-08-21 ENCOUNTER — Encounter: Payer: Self-pay | Admitting: Internal Medicine

## 2018-08-21 ENCOUNTER — Non-Acute Institutional Stay (SKILLED_NURSING_FACILITY): Payer: Medicare Other | Admitting: Internal Medicine

## 2018-08-21 DIAGNOSIS — N186 End stage renal disease: Secondary | ICD-10-CM

## 2018-08-21 DIAGNOSIS — Z992 Dependence on renal dialysis: Secondary | ICD-10-CM

## 2018-08-21 DIAGNOSIS — G9340 Encephalopathy, unspecified: Secondary | ICD-10-CM | POA: Diagnosis not present

## 2018-08-21 DIAGNOSIS — R4189 Other symptoms and signs involving cognitive functions and awareness: Secondary | ICD-10-CM

## 2018-08-21 DIAGNOSIS — R29818 Other symptoms and signs involving the nervous system: Secondary | ICD-10-CM

## 2018-08-21 DIAGNOSIS — R7881 Bacteremia: Secondary | ICD-10-CM | POA: Diagnosis not present

## 2018-08-21 NOTE — Assessment & Plan Note (Addendum)
08/21/2018 she is not encephalopathic but does exhibit some baseline neurocognitive deficit and lack of reality testing.  She is inquiring about kidney transplant despite her multiple comorbidities including myeloma and advanced age

## 2018-08-21 NOTE — Assessment & Plan Note (Signed)
Continue IV meropenem until 08/29/2018 to complete 2-week course CBC and differential and BMP recommended in 1 week; these are checked 3 times a week at hemodialysis Sed rate and CRP recommended every other week.  These can be completed at hemodialysis as well.

## 2018-08-21 NOTE — Progress Notes (Signed)
NURSING HOME LOCATION:  [facility] ROOM NUMBER:  [id number]  CODE STATUS:  Full Code  PCP:  Hendricks Limes, MD  [place] [address]  This is a [place] readmission within 30 days.  Interim medical record and care since last [place] visit was updated with review of diagnostic studies and change in clinical status since last visit were documented.  HPI: The patient was hospitalized 11/12-11/20/2019 with bacteremia due to Achromobacter species complicated by acute metabolic encephalopathy.  As on multiple prior occasions the encephalopathy onset was related to her refusal to go to hemodialysis.   She had been seen in the ED 10/26 having sustained  a laceration above the left eye in a fall in the context of encephalopathy.   Pressure injury to the medial buttocks was noted and wound care was continued as IP. Hemodialysis was continued while hospitalized. She also had swelling of the left upper extremity.  Duplex was negative for DVT.  Vascular surgery recommended no intervention. Speech therapy recommended dietary changes because of dysphagia.She was discharged on pured, nectar diet.  She had previously been on chopped thin diet.   Fluid restriction was to be 1200 cc per 24 hours. She was to continue IV meropenem until 08/29/2018 to complete a two-week course. CBC and differential and BMP were recommended in 1 week, she has these labs performed 3 times a week at hemodialysis.  Also every other week sed rate and CRP were recommended  Review of systems: She is alert and interactive and excited about being back at the SNF after "being in bed for 10 days".  Repeat mental status testing is pending, but she exhibits some lack of reality testing.  She questions whether she were a candidate for kidney transplant.  She has difficulty comprehending the concept of fluid retention in the context of her end-stage renal disease with anuria.  She has no other GU  symptoms.  She has had some cough and slight shortness of breath.  She denies dysphagia or reflux symptoms.  She has been receiving neb treatments.  She has chronic numbness in her feet.  Constitutional: No fever, significant weight change, fatigue  Eyes: No redness, discharge, pain, vision change ENT/mouth: No nasal congestion,  purulent discharge, earache, change in hearing, sore throat  Cardiovascular: No chest pain, palpitations, paroxysmal nocturnal dyspnea, claudication, edema  Respiratory: No  hemoptysis, significant snoring, apnea   Gastrointestinal: No heartburn, dysphagia, abdominal pain, nausea /vomiting, rectal bleeding, melena, change in bowels Genitourinary: anuric Musculoskeletal: No joint stiffness, joint swelling, weakness, pain Dermatologic: No rash, pruritus, change in appearance of skin Neurologic: No dizziness, headache, syncope, seizures,  Psychiatric: No significant anxiety, depression, insomnia, anorexia Endocrine: No change in hair/skin/nails, excessive thirst, excessive hunger  Hematologic/lymphatic: No significant bruising, lymphadenopathy, abnormal bleeding Allergy/immunology: No itchy/watery eyes, significant sneezing, urticaria, angioedema  Physical exam:  Pertinent or positive findings: She appears somewhat cushingoid in reference to her facies.  She is animated and interactive, laughing intermittently.  This is a dramatic change from her usual affect which is 1 of being withdrawn,suspicious and argumentative.  The laceration above the left eye reveals no evidence of secondary infection.  She is wearing only the upper plate.  There is slight drooping of the left mouth.  She has a grade 9/4-7 systolic murmur.  Bronchovesicular breath sounds are present diffusely.  She exhibits some coughing as she eats.  Pedal pulses are decreased.  Toenail deformities are present.  Shunt is present  left upper extremity.  She has an IV in the dorsum of the right hand.  She is weaker in  the left lower extremity than the right lower extremity.  General appearance: Adequately nourished; no acute distress, increased work of breathing is present.   Lymphatic: No lymphadenopathy about the head, neck, axilla. Eyes: No conjunctival inflammation or lid edema is present. There is no scleral icterus. Ears:  External ear exam shows no significant lesions or deformities.   Nose:  External nasal examination shows no deformity or inflammation. Nasal mucosa are pink and moist without lesions, exudates Oral exam:  Lips and gums are healthy appearing. Neck:  No thyromegaly, masses, tenderness noted.    Heart:  Normal rate and regular rhythm. S1 and S2 normal without gallop,  click, rub .  Lungs:  without wheezes, rhonchi, rales, rubs. Abdomen: Bowel sounds are normal. Abdomen is soft and nontender with no organomegaly, hernias, masses. GU: Deferred  Extremities:  No cyanosis, clubbing, edema  Skin: Warm & dry w/o tenting. No significant  rash.  See summary under each active problem in the Problem List with associated updated therapeutic plan

## 2018-08-21 NOTE — Assessment & Plan Note (Addendum)
SLUMS will be repeated to assess severity of neurocognitive deficits and competence

## 2018-08-21 NOTE — Patient Instructions (Signed)
See assessment and plan under each diagnosis in the problem list and acutely for this visit Total time 39  minutes; greater than 50% of the visit spent counseling patient and coordinating care for problems addressed at this encounter

## 2018-08-21 NOTE — Assessment & Plan Note (Addendum)
Reinforced concept of fluid restriction to 1200 cc a day.  Patient appears to have difficulty comprehending the concept and reasons for the fluid restriction. She is inquiring about renal transplant.  Dr. Moshe Cipro will be noted of these issues.

## 2018-08-25 ENCOUNTER — Emergency Department (HOSPITAL_COMMUNITY): Payer: Medicare Other

## 2018-08-25 ENCOUNTER — Inpatient Hospital Stay (HOSPITAL_COMMUNITY)
Admission: EM | Admit: 2018-08-25 | Discharge: 2018-09-02 | DRG: 871 | Disposition: A | Discharge status: Skilled Nursing Facility | Payer: Medicare Other | Attending: Internal Medicine | Admitting: Internal Medicine

## 2018-08-25 ENCOUNTER — Other Ambulatory Visit: Payer: Self-pay

## 2018-08-25 ENCOUNTER — Encounter (HOSPITAL_COMMUNITY): Payer: Self-pay

## 2018-08-25 ENCOUNTER — Emergency Department (HOSPITAL_COMMUNITY)
Admission: EM | Admit: 2018-08-25 | Discharge: 2018-08-25 | Discharge status: Against Medical Advice | Payer: Medicare Other | Source: Home / Self Care | Attending: Emergency Medicine | Admitting: Emergency Medicine

## 2018-08-25 ENCOUNTER — Encounter (HOSPITAL_COMMUNITY): Payer: Self-pay | Admitting: Emergency Medicine

## 2018-08-25 DIAGNOSIS — E871 Hypo-osmolality and hyponatremia: Secondary | ICD-10-CM

## 2018-08-25 DIAGNOSIS — A4159 Other Gram-negative sepsis: Secondary | ICD-10-CM | POA: Diagnosis not present

## 2018-08-25 DIAGNOSIS — D631 Anemia in chronic kidney disease: Secondary | ICD-10-CM | POA: Diagnosis present

## 2018-08-25 DIAGNOSIS — D649 Anemia, unspecified: Secondary | ICD-10-CM

## 2018-08-25 DIAGNOSIS — R7881 Bacteremia: Secondary | ICD-10-CM

## 2018-08-25 DIAGNOSIS — Z8701 Personal history of pneumonia (recurrent): Secondary | ICD-10-CM

## 2018-08-25 DIAGNOSIS — T82590A Other mechanical complication of surgically created arteriovenous fistula, initial encounter: Secondary | ICD-10-CM

## 2018-08-25 DIAGNOSIS — Y832 Surgical operation with anastomosis, bypass or graft as the cause of abnormal reaction of the patient, or of later complication, without mention of misadventure at the time of the procedure: Secondary | ICD-10-CM | POA: Diagnosis present

## 2018-08-25 DIAGNOSIS — F419 Anxiety disorder, unspecified: Secondary | ICD-10-CM

## 2018-08-25 DIAGNOSIS — R1310 Dysphagia, unspecified: Secondary | ICD-10-CM | POA: Diagnosis present

## 2018-08-25 DIAGNOSIS — F329 Major depressive disorder, single episode, unspecified: Secondary | ICD-10-CM | POA: Insufficient documentation

## 2018-08-25 DIAGNOSIS — Z5329 Procedure and treatment not carried out because of patient's decision for other reasons: Secondary | ICD-10-CM

## 2018-08-25 DIAGNOSIS — Z7189 Other specified counseling: Secondary | ICD-10-CM

## 2018-08-25 DIAGNOSIS — Z992 Dependence on renal dialysis: Secondary | ICD-10-CM | POA: Insufficient documentation

## 2018-08-25 DIAGNOSIS — R001 Bradycardia, unspecified: Secondary | ICD-10-CM | POA: Diagnosis present

## 2018-08-25 DIAGNOSIS — R195 Other fecal abnormalities: Secondary | ICD-10-CM

## 2018-08-25 DIAGNOSIS — T68XXXA Hypothermia, initial encounter: Secondary | ICD-10-CM | POA: Diagnosis present

## 2018-08-25 DIAGNOSIS — Z79899 Other long term (current) drug therapy: Secondary | ICD-10-CM | POA: Insufficient documentation

## 2018-08-25 DIAGNOSIS — N2581 Secondary hyperparathyroidism of renal origin: Secondary | ICD-10-CM | POA: Diagnosis present

## 2018-08-25 DIAGNOSIS — F0390 Unspecified dementia without behavioral disturbance: Secondary | ICD-10-CM | POA: Diagnosis present

## 2018-08-25 DIAGNOSIS — I160 Hypertensive urgency: Secondary | ICD-10-CM | POA: Diagnosis present

## 2018-08-25 DIAGNOSIS — I120 Hypertensive chronic kidney disease with stage 5 chronic kidney disease or end stage renal disease: Secondary | ICD-10-CM | POA: Diagnosis present

## 2018-08-25 DIAGNOSIS — Z8579 Personal history of other malignant neoplasms of lymphoid, hematopoietic and related tissues: Secondary | ICD-10-CM

## 2018-08-25 DIAGNOSIS — R29818 Other symptoms and signs involving the nervous system: Secondary | ICD-10-CM | POA: Diagnosis present

## 2018-08-25 DIAGNOSIS — K922 Gastrointestinal hemorrhage, unspecified: Secondary | ICD-10-CM | POA: Diagnosis present

## 2018-08-25 DIAGNOSIS — T827XXA Infection and inflammatory reaction due to other cardiac and vascular devices, implants and grafts, initial encounter: Secondary | ICD-10-CM

## 2018-08-25 DIAGNOSIS — R4189 Other symptoms and signs involving cognitive functions and awareness: Secondary | ICD-10-CM | POA: Diagnosis present

## 2018-08-25 DIAGNOSIS — N186 End stage renal disease: Secondary | ICD-10-CM | POA: Diagnosis present

## 2018-08-25 DIAGNOSIS — Z9181 History of falling: Secondary | ICD-10-CM

## 2018-08-25 DIAGNOSIS — Z1624 Resistance to multiple antibiotics: Secondary | ICD-10-CM | POA: Diagnosis present

## 2018-08-25 DIAGNOSIS — R531 Weakness: Secondary | ICD-10-CM

## 2018-08-25 DIAGNOSIS — B9689 Other specified bacterial agents as the cause of diseases classified elsewhere: Secondary | ICD-10-CM | POA: Diagnosis present

## 2018-08-25 DIAGNOSIS — Z515 Encounter for palliative care: Secondary | ICD-10-CM

## 2018-08-25 DIAGNOSIS — D801 Nonfamilial hypogammaglobulinemia: Secondary | ICD-10-CM | POA: Diagnosis present

## 2018-08-25 DIAGNOSIS — Z888 Allergy status to other drugs, medicaments and biological substances status: Secondary | ICD-10-CM

## 2018-08-25 DIAGNOSIS — N189 Chronic kidney disease, unspecified: Secondary | ICD-10-CM

## 2018-08-25 DIAGNOSIS — D500 Iron deficiency anemia secondary to blood loss (chronic): Secondary | ICD-10-CM | POA: Diagnosis present

## 2018-08-25 DIAGNOSIS — C9000 Multiple myeloma not having achieved remission: Secondary | ICD-10-CM | POA: Diagnosis present

## 2018-08-25 DIAGNOSIS — K219 Gastro-esophageal reflux disease without esophagitis: Secondary | ICD-10-CM | POA: Diagnosis present

## 2018-08-25 DIAGNOSIS — Z8249 Family history of ischemic heart disease and other diseases of the circulatory system: Secondary | ICD-10-CM

## 2018-08-25 DIAGNOSIS — Z5320 Procedure and treatment not carried out because of patient's decision for unspecified reasons: Secondary | ICD-10-CM

## 2018-08-25 DIAGNOSIS — I8290 Acute embolism and thrombosis of unspecified vein: Secondary | ICD-10-CM

## 2018-08-25 DIAGNOSIS — T82868A Thrombosis of vascular prosthetic devices, implants and grafts, initial encounter: Secondary | ICD-10-CM | POA: Diagnosis present

## 2018-08-25 DIAGNOSIS — L8991 Pressure ulcer of unspecified site, stage 1: Secondary | ICD-10-CM | POA: Diagnosis present

## 2018-08-25 LAB — COMPREHENSIVE METABOLIC PANEL
ALT: 8 U/L (ref 0–44)
AST: 16 U/L (ref 15–41)
Albumin: 2.7 g/dL — ABNORMAL LOW (ref 3.5–5.0)
Alkaline Phosphatase: 44 U/L (ref 38–126)
Anion gap: 14 (ref 5–15)
BUN: 45 mg/dL — AB (ref 8–23)
CHLORIDE: 86 mmol/L — AB (ref 98–111)
CO2: 23 mmol/L (ref 22–32)
CREATININE: 9.21 mg/dL — AB (ref 0.44–1.00)
Calcium: 8.5 mg/dL — ABNORMAL LOW (ref 8.9–10.3)
GFR, EST AFRICAN AMERICAN: 4 mL/min — AB (ref >60)
GFR, EST NON AFRICAN AMERICAN: 4 mL/min — AB (ref >60)
Glucose, Bld: 104 mg/dL — ABNORMAL HIGH (ref 70–99)
POTASSIUM: 4.3 mmol/L (ref 3.5–5.1)
SODIUM: 123 mmol/L — AB (ref 135–145)
TOTAL PROTEIN: 8.2 g/dL — AB (ref 6.5–8.1)
Total Bilirubin: 0.8 mg/dL (ref 0.3–1.2)

## 2018-08-25 LAB — CBC WITH DIFFERENTIAL/PLATELET
Abs Immature Granulocytes: 0 10*3/uL (ref 0.00–0.07)
Abs Immature Granulocytes: 0.02 10*3/uL (ref 0.00–0.07)
BASOS ABS: 0 10*3/uL (ref 0.0–0.1)
BASOS ABS: 0 10*3/uL (ref 0.0–0.1)
BASOS PCT: 0 %
Basophils Relative: 1 %
EOS ABS: 0.1 10*3/uL (ref 0.0–0.5)
EOS PCT: 2 %
EOS PCT: 1 %
Eosinophils Absolute: 0.1 10*3/uL (ref 0.0–0.5)
HCT: 23.3 % — ABNORMAL LOW (ref 36.0–46.0)
HEMATOCRIT: 23.6 % — AB (ref 36.0–46.0)
Hemoglobin: 7.1 g/dL — ABNORMAL LOW (ref 12.0–15.0)
Hemoglobin: 7.1 g/dL — ABNORMAL LOW (ref 12.0–15.0)
Immature Granulocytes: 1 %
LYMPHS ABS: 0.8 10*3/uL (ref 0.7–4.0)
LYMPHS PCT: 16 %
Lymphocytes Relative: 20 %
Lymphs Abs: 0.6 10*3/uL — ABNORMAL LOW (ref 0.7–4.0)
MCH: 30.2 pg (ref 26.0–34.0)
MCH: 30.3 pg (ref 26.0–34.0)
MCHC: 30.1 g/dL (ref 30.0–36.0)
MCHC: 30.5 g/dL (ref 30.0–36.0)
MCV: 100.4 fL — AB (ref 80.0–100.0)
MCV: 99.6 fL (ref 80.0–100.0)
MONO ABS: 0.1 10*3/uL (ref 0.1–1.0)
Monocytes Absolute: 0.4 10*3/uL (ref 0.1–1.0)
Monocytes Relative: 3 %
Monocytes Relative: 10 %
NEUTROS ABS: 2.6 10*3/uL (ref 1.7–7.7)
NRBC: 0 % (ref 0.0–0.2)
NRBC: 0 % (ref 0.0–0.2)
Neutro Abs: 2.9 10*3/uL (ref 1.7–7.7)
Neutrophils Relative %: 74 %
Neutrophils Relative %: 72 %
PLATELETS: 228 10*3/uL (ref 150–400)
Platelets: 234 10*3/uL (ref 150–400)
RBC: 2.35 MIL/uL — ABNORMAL LOW (ref 3.87–5.11)
RBC: 2.34 MIL/uL — AB (ref 3.87–5.11)
RDW: 16.1 % — ABNORMAL HIGH (ref 11.5–15.5)
RDW: 16.2 % — ABNORMAL HIGH (ref 11.5–15.5)
WBC: 3.9 10*3/uL — ABNORMAL LOW (ref 4.0–10.5)
WBC: 3.7 10*3/uL — AB (ref 4.0–10.5)
nRBC: 0 /100 WBC

## 2018-08-25 LAB — BASIC METABOLIC PANEL
Anion gap: 12 (ref 5–15)
BUN: 49 mg/dL — AB (ref 8–23)
CALCIUM: 8.3 mg/dL — AB (ref 8.9–10.3)
CHLORIDE: 87 mmol/L — AB (ref 98–111)
CO2: 23 mmol/L (ref 22–32)
CREATININE: 9.48 mg/dL — AB (ref 0.44–1.00)
GFR, EST AFRICAN AMERICAN: 4 mL/min — AB (ref >60)
GFR, EST NON AFRICAN AMERICAN: 3 mL/min — AB (ref >60)
Glucose, Bld: 85 mg/dL (ref 70–99)
Potassium: 4.4 mmol/L (ref 3.5–5.1)
SODIUM: 122 mmol/L — AB (ref 135–145)

## 2018-08-25 LAB — PREPARE RBC (CROSSMATCH)

## 2018-08-25 LAB — PROTIME-INR
INR: 1.09
Prothrombin Time: 14 seconds (ref 11.4–15.2)

## 2018-08-25 LAB — POC OCCULT BLOOD, ED: Fecal Occult Bld: POSITIVE — AB

## 2018-08-25 LAB — I-STAT TROPONIN, ED: TROPONIN I, POC: 0.01 ng/mL (ref 0.00–0.08)

## 2018-08-25 LAB — APTT: aPTT: 35 seconds (ref 24–36)

## 2018-08-25 LAB — I-STAT CG4 LACTIC ACID, ED: Lactic Acid, Venous: 1.2 mmol/L (ref 0.5–1.9)

## 2018-08-25 MED ORDER — SODIUM CHLORIDE 0.9 % IV SOLN
10.0000 mL/h | Freq: Once | INTRAVENOUS | Status: AC
  Administered 2018-08-26: 10 mL/h via INTRAVENOUS

## 2018-08-25 NOTE — ED Provider Notes (Signed)
[facility] EMERGENCY DEPARTMENT Provider Note   CSN: [id number] Arrival date & time: 08/25/18  1459     History   Chief Complaint Chief Complaint  Patient presents with  . Weakness    HPI [name] is a 77 y.o. female with a history of end-stage renal disease on hemodialysis (Monday Wednesday Friday), history of anemia, multiple myeloma, and recent bacteremia and UTI on 11/12 who presents emergency department today for weakness and hypotension.  Patient was seen here on 11/12 and found to be septic.  She was admitted and treated for UTI as well as bacteremia.  Her blood cultures grew out achromobacter species.  She was discharged home on 11/20 was supposed to continue meropenem IV until 11/29.  Patient's hospital course was comp gated by metabolic encephalopathy.  She was discharged back to her nursing facility at [facility] where she is followed by Dr. Linna Darner.  Patient is presenting today because she was found at dialysis to be severely hypotensive and was able to receive dialysis.  She also complained of generalized weakness, bilateral shoulder pain, and a nonproductive cough.    She has had ongoing dysphasia per notes from last visit that is unchanged. On chart reviewed appears the patient has had encephalopathy multiple times secondary to refusal to go to hemodialysis.  Patient reports she sustained a fall on 10/26. States she is stopped producing urine over the last several days.  She denies any fever, chills, facial droop, focal weakness.  She does note generalized weakness.  She notes intermittent episodes of visual changes but cannot specify this.  No diplopia.  She denies dizziness.  No melena or hematochezia.  Please see note from Dr. Linna Darner below from today.       HPI  Past Medical History:  Diagnosis Date  . Anxiety   . Closed fracture of right distal femur ([place]) 09/01/2015  . Depression   . ESRD (end stage renal disease) on dialysis [facility])    "TTS; [facility]" (04/24/2017)  . GERD (gastroesophageal reflux disease) 04/23/2015  . HCAP (healthcare-associated pneumonia) 04/24/2017  . Heart murmur   . History of blood transfusion    "low HgB when I was going to dialysis center"  . Hypertension   . Hypertension associated with end stage renal disease on dialysis 03/11/2014  . Malnutrition of moderate degree 09/02/2015  . Multiple myeloma ([place]) 03/11/2014  . Patient is Jehovah's Witness    "I'd rather never have any blood transfusions unless absolutely necessary; please check with me 1st". (04/24/2017)  . Pneumonia ~ 2016    Patient Active Problem List   Diagnosis Date Noted  . Bacteremia   . Acute lower UTI 08/12/2018  . Swelling of joint of left wrist 08/12/2018  . Rectal bleeding 08/12/2018  . Neurocognitive deficits 06/05/2018  . Altered mental status 05/28/2018  . Macrocytosis 05/01/2018  . Hypertension, uncontrolled 04/16/2018  . ESRD needing dialysis ([place]) 04/16/2018  . Encephalopathy acute 04/16/2018  . Anxiety and depression 04/16/2018  . Hypertension, accelerated 04/16/2018  . Pressure ulcer, stage I 04/16/2018  . Abnormal chest x-ray 12/26/2017  . HCAP (healthcare-associated pneumonia) 04/24/2017  . Sepsis ([place]) 04/24/2017  . Nausea and vomiting 04/24/2017  . Thrombocytopenia ([place]) 04/24/2017  . Hyperkalemia 01/16/2017  . IBS (irritable bowel syndrome) 12/27/2016  . Mood disorder ([place]) 07/19/2016  . Diastolic dysfunction 03/47/4259  . Insomnia 04/27/2016  . Hyponatremia 03/12/2016  . Diarrhea 03/12/2016  . Hypertensive urgency 02/18/2016  . Swelling of joint of left  knee 02/18/2016  . Leukopenia due to antineoplastic chemotherapy ([place]) 09/03/2015  . Malnutrition of moderate degree 09/02/2015  . GERD (gastroesophageal reflux disease) 04/23/2015  . 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 Multiple myeloma ([place]) 03/11/2014  . Hypertension associatd with end stage renal disease on dialysis 03/11/2014    Past Surgical  History:  Procedure Laterality Date  . AV FISTULA PLACEMENT Left   . I&D EXTREMITY Left 02/27/2016   Procedure: ARTHROSCOPIC IRRIGATION AND DEBRIDEMENT EXTREMITY;  Surgeon: Renette Butters, MD;  Location: [facility];  Service: Orthopedics;  Laterality: Left;  . TEE WITHOUT CARDIOVERSION N/A 02/29/2016   Procedure: TRANSESOPHAGEAL ECHOCARDIOGRAM (TEE);  Surgeon: Thayer Headings, MD;  Location: [facility];  Service: Cardiovascular;  Laterality: N/A;     OB History   None      Home Medications    Prior to Admission medications   Medication Sig Start Date End Date Taking? Authorizing Provider  acetaminophen (TYLENOL) 325 MG tablet Take 650 mg by mouth every 6 (six) hours as needed for fever or headache (pain). Do not exceed 3000 mg in 24 hours    [provider]  amLODipine (NORVASC) 10 MG tablet Take 10 mg by mouth every evening. Hold for SBP <100MM/HG    [provider]  bisacodyl (DULCOLAX) 10 MG suppository Place 10 mg rectally daily as needed for moderate constipation (constipation not relieved by MOM).    [provider]  calcium acetate (PHOSLO) 667 MG capsule Take 1,334 mg by mouth 3 (three) times daily with meals.     [provider]  cloNIDine (CATAPRES - DOSED IN MG/24 HR) 0.2 mg/24hr patch Place 1 patch (0.2 mg total) onto the skin once a week. 06/04/18   Roxan Hockey, MD  cloNIDine (CATAPRES) 0.1 MG tablet Take 0.1 mg by mouth every 8 (eight) hours as needed (sbp >180).     [provider]  isosorbide mononitrate (IMDUR) 60 MG 24 hr tablet Take 60 mg by mouth daily.  03/24/15   [provider]  lamoTRIgine (LAMICTAL) 25 MG tablet Take 50 mg by mouth daily.     [provider]  latanoprost (XALATAN) 0.005 % ophthalmic solution Place 1 drop into both eyes at bedtime.     [provider]  loperamide (IMODIUM A-D) 2 MG tablet Take 4 mg by mouth as needed for diarrhea or loose stools.     [provider]    meropenem (MERREM) IVPB Inject 500 mg into the vein daily for 10 days. Indication:  Achromobacter Bacteremia Last Day of Therapy:  08/29/18 Labs - Once weekly:  CBC/D and BMP, Labs - Every other week:  ESR and CRP 08/20/18 08/30/18  Rai, Ripudeep K, MD  multivitamin (RENA-VIT) TABS tablet Take 1 tablet by mouth daily.    [provider]  ondansetron (ZOFRAN) 4 MG tablet Take 4 mg by mouth every 8 (eight) hours as needed for nausea or vomiting.    [provider]  ranitidine (ZANTAC) 300 MG tablet Take 300 mg by mouth at bedtime. Reported on 03/05/2016    [provider]  saccharomyces boulardii (FLORASTOR) 250 MG capsule Take 250 mg by mouth 2 (two) times daily as needed (loose stools).     [provider]  senna-docusate (SENEXON-S) 8.6-50 MG tablet Take 2 tablets by mouth at bedtime as needed (constipation).     [provider]  sevelamer carbonate (RENVELA) 800 MG tablet Take 2 tablets (1,600 mg total) by mouth 3 (three) times  daily with meals. 05/30/18   Roxan Hockey, MD  simethicone (MYLICON) 191 MG chewable tablet Chew 125 mg by mouth 3 (three) times daily. Take at 8 AM, 11AM, 6PM    [provider]  Sodium Phosphates (FLEET ENEMA RE) Place 1 enema rectally daily as needed (constipation not relieved by bisacodyl suppository).    [provider]  valACYclovir (VALTREX) 500 MG tablet Take 1 tablet (500 mg total) by mouth every other day. 03/01/16   Hongalgi, Lenis Dickinson, MD    Family History Family History  Problem Relation Age of Onset  . Hypertension Mother   . Hypertension Father     Social History Social History   Tobacco Use  . Smoking status: Never Smoker  . Smokeless tobacco: Never Used  Substance Use Topics  . Alcohol use: No    Alcohol/week: 0.0 standard drinks  . Drug use: No     Allergies   Phenergan [promethazine hcl]   Review of Systems Review of Systems  All other systems reviewed and are  negative.    Physical Exam Updated Vital Signs BP 108/82 (BP Location: Right Arm)   Pulse (!) 49   Temp 97.8 F (36.6 C) (Oral)   Resp 16   Ht '5\' 3"'  (1.6 m)   Wt 58.3 kg   BMI 22.77 kg/m   Physical Exam  Constitutional: She appears well-developed and well-nourished.  Elderly female in no acute distress.  HENT:  Head: Normocephalic and atraumatic.  Right Ear: External ear normal.  Left Ear: External ear normal.  Nose: Nose normal.  Mouth/Throat: Uvula is midline, oropharynx is clear and moist and mucous membranes are normal. No tonsillar exudate.  Eyes: Pupils are equal, round, and reactive to light. Right eye exhibits no discharge. Left eye exhibits no discharge. No scleral icterus.  Neck: Trachea normal. Neck supple. No spinous process tenderness present. No neck rigidity. Normal range of motion present.  No nuchal rigidity or meningismus  Cardiovascular: Normal rate, regular rhythm and intact distal pulses.  No murmur heard. Pulses:      Radial pulses are 2+ on the right side, and 2+ on the left side.       Dorsalis pedis pulses are 2+ on the right side, and 2+ on the left side.       Posterior tibial pulses are 2+ on the right side, and 2+ on the left side.  Nonpitting edema to the b/l lower extremities  Pulmonary/Chest: Effort normal. She has decreased breath sounds. She exhibits no tenderness.  No increased work of breathing. No accessory muscle use. Patient is sitting upright, speaking in full sentences without difficulty   Abdominal: Soft. Bowel sounds are normal. She exhibits no distension. There is no tenderness. There is no rigidity, no rebound and no guarding.  Genitourinary:  Genitourinary Comments: Chaperone present.  Patient with noted skin breakdown around the rectum without obvious deep pressure ulcer.  No gross melena or blood in stool.  Musculoskeletal: She exhibits no edema.       Right shoulder: She exhibits tenderness. She exhibits normal range of motion.        Left shoulder: She exhibits tenderness. She exhibits normal range of motion.       Cervical back: Normal.       Arms: Negative logroll test bilaterally.  Passive range of motion of lower extremities without pain or difficulty.  Patient does have pain to bilateral shoulders.  There is no joint swelling, overlying erythema or heat.  She has  passive range of motion that is intact but noted to be painful with all motions.  Intact range of motion of upper extremities distal to this without pain or difficulty.  No thoracic or lumbar spinous tenderness palpation or step-offs.  Lymphadenopathy:    She has no cervical adenopathy.  Neurological: She is alert.  Mental Status:  Alert and oriented to person, place and time.  Thought content appropriate however states that she feels confused.  She is noted to have slurred speech.   Cranial Nerves:  II: Patient reports blurriness and medial visual field loss in the right eye, pupils equal, round, reactive to light III,IV, VI: ptosis not present, extra-ocular motions intact bilaterally  V,VII: smile symmetric, eyebrows raise symmetric, facial light touch sensation equal VIII: hearing grossly normal to voice  X: uvula elevates symmetrically  XI: bilateral shoulder shrug symmetric and strong XII: midline tongue extension without fassiculations Motor:  Normal tone.  Equal and appropriate grip strength bilaterally.  Patient unable to perform pronator drift as she describes pain in her shoulders bilaterally.  Able plantar flexion and hip flexion bilaterally. Sensory: Sensation intact to light touch in all extremities. Cerebellar: unable to perform 2/2 to pain of extremities  Gait: Not tested CV: distal pulses palpable throughout   Skin: Skin is warm, dry and intact. No rash noted. She is not diaphoretic. No erythema.  Psychiatric: She has a normal mood and affect.  Nursing note and vitals reviewed.    ED Treatments / Results  Labs (all labs ordered  are listed, but only abnormal results are displayed) Labs Reviewed  CBC WITH DIFFERENTIAL/PLATELET - Abnormal; Notable for the following components:      Result Value   WBC 3.9 (*)    RBC 2.35 (*)    Hemoglobin 7.1 (*)    HCT 23.6 (*)    MCV 100.4 (*)    RDW 16.1 (*)    All other components within normal limits  COMPREHENSIVE METABOLIC PANEL - Abnormal; Notable for the following components:   Sodium 123 (*)    Chloride 86 (*)    Glucose, Bld 104 (*)    BUN 45 (*)    Creatinine, Ser 9.21 (*)    Calcium 8.5 (*)    Total Protein 8.2 (*)    Albumin 2.7 (*)    GFR calc non Af Amer 4 (*)    GFR calc Af Amer 4 (*)    All other components within normal limits  POC OCCULT BLOOD, ED - Abnormal; Notable for the following components:   Fecal Occult Bld POSITIVE (*)    All other components within normal limits  URINE CULTURE  CULTURE, BLOOD (ROUTINE X 2)  CULTURE, BLOOD (ROUTINE X 2)  PROTIME-INR  APTT  URINALYSIS, ROUTINE W REFLEX MICROSCOPIC  OCCULT BLOOD X 1 CARD TO LAB, STOOL  SODIUM, URINE, RANDOM  I-STAT CG4 LACTIC ACID, ED  I-STAT TROPONIN, ED  TYPE AND SCREEN    EKG EKG Interpretation  Date/Time:  Monday August 25 2018 15:56:55 EST Ventricular Rate:  53 PR Interval:    QRS Duration: 101 QT Interval:  513 QTC Calculation: 482 R Axis:   15 Text Interpretation:  Sinus rhythm Prolonged PR interval Baseline wander in lead(s) V1 V4 No STEMI.  Confirmed by Nanda Quinton [phone]) on 08/25/2018 4:01:31 PM Also confirmed by Nanda Quinton [phone]), editor Philomena Doheny [phone])  on 08/25/2018 5:10:40 PM   Radiology Dg Chest 2 View  Result Date: 08/25/2018 CLINICAL DATA:  Altered mental status  EXAM: CHEST - 2 VIEW COMPARISON:  Chest x-rays dated 08/12/2018 and 06/26/2017 FINDINGS: Stable cardiomegaly. Lungs are clear. No pleural effusion or pneumothorax seen. Osseous structures about the chest are unremarkable. IMPRESSION: 1. No active cardiopulmonary disease. No evidence of  pneumonia or pulmonary edema. 2. Stable cardiomegaly. Electronically Signed   By: Franki Cabot M.D.   On: 08/25/2018 17:23   Ct Head Wo Contrast  Result Date: 08/25/2018 CLINICAL DATA:  New onset weakness after dialysis. Hypotension. Slurred speech. EXAM: CT HEAD WITHOUT CONTRAST TECHNIQUE: Contiguous axial images were obtained from the base of the skull through the vertex without intravenous contrast. COMPARISON:  CT head without contrast 07/26/2018 FINDINGS: Brain: Moderate atrophy and white matter is similar to the prior exam. No acute infarct, hemorrhage, or mass lesion is present. Diffuse hypoattenuation is present throughout the basal ganglia. A remote lacunar infarct is again seen in the left cerebellum. Ventricles are proportionate to the degree of atrophy. No significant extra-axial fluid collection present. Vascular: Atherosclerotic calcifications are present within the cavernous internal carotid arteries bilaterally. There is no hyperdense vessel. Skull: Multiple lucent lesions are again noted. There is no significant interval change. Findings are consistent with known multiple myeloma. There is a focal lesion involving the left arch of C1. Sinuses/Orbits: The paranasal sinuses and mastoid air cells are clear. Globes and orbits are within normal limits. IMPRESSION: 1. Stable appearance of atrophy and white matter disease. 2. No acute intracranial abnormality. 3. Multiple lucent lesions in the calvarium and left-sided arch of C1 consistent with known multiple myeloma. There is no significant progression. Electronically Signed   By: San Morelle M.D.   On: 08/25/2018 17:22    Procedures Procedures (including critical care time)  Medications Ordered in ED Medications - No data to display   Initial Impression / Assessment and Plan / ED Course  I have reviewed the triage vital signs and the nursing notes.  Pertinent labs & imaging results that were available during my care of the  patient were reviewed by me and considered in my medical decision making (see chart for details).     77 y.o. female with a history of end-stage renal disease on hemodialysis (Monday Wednesday Friday), history of anemia, multiple myeloma, and recent bacteremia and UTI on 11/12 who presents emergency department today for weakness and hypotension.  Patient was seen here on 11/12 and found to be septic.  She was admitted and treated for UTI as well as bacteremia.  Her blood cultures grew out achromobacter species.  She was discharged home on 11/20 was supposed to continue meropenem IV until 11/29.  Patient's hospital course was comp gated by metabolic encephalopathy.  She was discharged back to her nursing facility at [facility] where she is followed by Dr. Linna Darner.  Patient is presenting today because she was found at dialysis to be severely hypotensive and was able to receive dialysis.  She also complained of generalized weakness, bilateral shoulder pain, and a nonproductive cough.    She has had ongoing dysphasia per notes from last visit that is unchanged. On chart reviewed appears the patient has had encephalopathy multiple times secondary to refusal to go to hemodialysis.  Patient reports she sustained a fall on 10/26. States she is stopped producing urine over the last several days.  She denies any fever, chills, facial droop, focal weakness.  She does note generalized weakness.  She notes intermittent episodes of visual changes but cannot specify this.  No diplopia.  She denies dizziness.  No  melena or hematochezia.    On arrival VSS. No fever, tachycardia, tachypnea, hypoxia or hypotension.  She does have some bradycardia.  Rectal temp without fever.  Exam as above.    Patient without leukocytosis.  No neutropenia.  EKG with sinus bradycardia.  No STEMI.  Troponin within normal limits.  Lactic acid within normal limits.  Do not suspect sepsis.  Blood cultures were obtained.  Patient reports that she does  not produce urine.  Urine was ordered if patient is able to produce sample.  Chest x-ray without any active cardiopulmonary disease.  CT scan stable without any active intracranial abnormality.  CBC is significant for hemoglobin of 7.1.  This has dropped since the 19th.  Patient also noted to have hyponatremia at 123. Fecal occult positive.   Discussed with Dr. Augustin Coupe nephrology. Advised to give 1U of blood and plan for dialysis tomorrow. No fluid at this time.   I advised patient of plan to give 1 unit of blood and admit for dialysis tomorrow given hypotension at dialysis center, hyponatremia, and anemia requiring blood transfusion. 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  Will touch base with nephrology to have updated plan.  5:48 PM Before being able to touch base with nephrology again the patient became enraged and started screaming at staff and becoming very aggressive.  She states she does not want to be admitted and does not wish to stay.  We discussed the nature and purpose, risks and benefits, Time was given to allow the opportunity to ask questions. Patient decided to refuse the offerred treatment. The patient was informed that refusal could lead to, but was not limited to, death, permanent disability, or severe pain.No family present. Prior to refusing, I determined that the patient had the capacity to make their decision and understood the consequences of that decision. Dr. Laverta Baltimore has also seen the patient and discussed this with her and agrees patient has capacity. After refusal, I made every reasonable opportunity to treat them to the best of my ability.  The patient was notified that they may return to the emergency department at any time for further treatment. I urged patient to make sure to followup and receive dialysis and address lab abnormalities if she does not wish to return.   Final Clinical Impressions(s) / ED Diagnoses   Final diagnoses:  Weakness   Hyponatremia  Left against medical advice  Anemia, unspecified type    ED Discharge Orders    None       Jillyn Ledger, Hershal Coria 08/25/18 1814    Margette Fast, MD 08/26/18 1013

## 2018-08-25 NOTE — ED Notes (Signed)
Patient signed consent form for blood transfusion . 

## 2018-08-25 NOTE — ED Notes (Signed)
Patient Alert and oriented to baseline. Stable and ambulatory to baseline. Patient verbalized understanding of the discharge instructions.  Patient belongings were taken by the patient. Pt transported back to [facility] by [employer]

## 2018-08-25 NOTE — ED Triage Notes (Signed)
Patient arrived with EMS from [facility] for anemia , pt. was seen here today but refused blood transfusion due to religious belief  ( Jehova's Witness) discharged back to nursing home , returned here staff reported pt. changed her mind - willing to accept blood transfusion .

## 2018-08-25 NOTE — Discharge Instructions (Signed)
You have decided to leave against medical advice. We discussed that this can lead to but was not limited to, death, permanent disability, or severe pain.   We recommended a blood transfusion given your low blood counts (hemoglobin) in the department.  You have respectively declined given that you are Jehovah's Witness. Your sodium is significantly low and you were reported to have low blood pressures at your facility. We recommended admission and dialysis tomorrow. You have decided to leave [place]  Please make sure to see your primary care provider and get dialysis as soon as possible. We are here at any time if you need [place].

## 2018-08-25 NOTE — ED Triage Notes (Signed)
Per [facility] pt came back to [facility] from dialysis today and was complaining of weakness. Pt is currently complaining of bilateral shoulder soreness. Pt did have hypotension at dialysis today per report and did have a fall one week ago.

## 2018-08-25 NOTE — ED Provider Notes (Signed)
[place] EMERGENCY DEPARTMENT Provider Note   CSN: [id number] Arrival date & time: 08/25/18  2240     History   Chief Complaint Chief Complaint  Patient presents with  . Anemia    HPI [name] is a 77 y.o. female.  The history is provided by the patient.  She has history of hypertension, end-stage renal disease on hemodialysis, multiple myeloma, diastolic dysfunction and comes in from her skilled nursing facility for blood transfusion.  She had been in the emergency department earlier today because of anemia related to combination of blood loss and end-stage renal disease and had refused transfusion.  Upon returning to her nursing facility, she changed her mind and is willing to accept a transfusion.  She states that she had lost a lot of blood following a fall last month with a head injury.  She denies chest pain, had dizziness, nausea, vomiting, melena.  She does admit to some dyspnea.  Past Medical History:  Diagnosis Date  . Anxiety   . Closed fracture of right distal femur ([place]) 09/01/2015  . Depression   . ESRD (end stage renal disease) on dialysis [facility])    "TTS; [facility]" (04/24/2017)  . GERD (gastroesophageal reflux disease) 04/23/2015  . HCAP (healthcare-associated pneumonia) 04/24/2017  . Heart murmur   . History of blood transfusion    "low HgB when I was going to dialysis center"  . Hypertension   . Hypertension associated with end stage renal disease on dialysis 03/11/2014  . Malnutrition of moderate degree 09/02/2015  . Multiple myeloma ([place]) 03/11/2014  . Patient is Jehovah's Witness    "I'd rather never have any blood transfusions unless absolutely necessary; please check with me 1st". (04/24/2017)  . Pneumonia ~ 2016    Patient Active Problem List   Diagnosis Date Noted  . Bacteremia   . Acute lower UTI 08/12/2018  . Swelling of joint of left wrist 08/12/2018  . Rectal bleeding 08/12/2018  . Neurocognitive deficits 06/05/2018  .  Altered mental status 05/28/2018  . Macrocytosis 05/01/2018  . Hypertension, uncontrolled 04/16/2018  . ESRD needing dialysis ([place]) 04/16/2018  . Encephalopathy acute 04/16/2018  . Anxiety and depression 04/16/2018  . Hypertension, accelerated 04/16/2018  . Pressure ulcer, stage I 04/16/2018  . Abnormal chest x-ray 12/26/2017  . HCAP (healthcare-associated pneumonia) 04/24/2017  . Sepsis ([place]) 04/24/2017  . Nausea and vomiting 04/24/2017  . Thrombocytopenia ([place]) 04/24/2017  . Hyperkalemia 01/16/2017  . IBS (irritable bowel syndrome) 12/27/2016  . Mood disorder ([place]) 07/19/2016  . Diastolic dysfunction 98/33/8250  . Insomnia 04/27/2016  . Hyponatremia 03/12/2016  . Diarrhea 03/12/2016  . Hypertensive urgency 02/18/2016  . Swelling of joint of left knee 02/18/2016  . Leukopenia due to antineoplastic chemotherapy ([place]) 09/03/2015  . Malnutrition of moderate degree 09/02/2015  . GERD (gastroesophageal reflux disease) 04/23/2015  . 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 Multiple myeloma ([place]) 03/11/2014  . Hypertension associatd with end stage renal disease on dialysis 03/11/2014    Past Surgical History:  Procedure Laterality Date  . AV FISTULA PLACEMENT Left   . I&D EXTREMITY Left 02/27/2016   Procedure: ARTHROSCOPIC IRRIGATION AND DEBRIDEMENT EXTREMITY;  Surgeon: Renette Butters, MD;  Location: [place];  Service: Orthopedics;  Laterality: Left;  . TEE WITHOUT CARDIOVERSION N/A 02/29/2016   Procedure: TRANSESOPHAGEAL ECHOCARDIOGRAM (TEE);  Surgeon: Thayer Headings, MD;  Location: [place];  Service: Cardiovascular;  Laterality: N/A;     OB History   None  Home Medications    Prior to Admission medications   Medication Sig Start Date End Date Taking? Authorizing Provider  acetaminophen (TYLENOL) 325 MG tablet Take 650 mg by mouth every 6 (six) hours as needed for fever or headache (pain). Do not exceed 3000 mg in 24 hours    [provider]  amLODipine  (NORVASC) 10 MG tablet Take 10 mg by mouth every evening. Hold for SBP <100MM/HG    [provider]  bisacodyl (DULCOLAX) 10 MG suppository Place 10 mg rectally daily as needed for moderate constipation (constipation not relieved by MOM).    [provider]  calcium acetate (PHOSLO) 667 MG capsule Take 1,334 mg by mouth 3 (three) times daily with meals.     [provider]  cloNIDine (CATAPRES - DOSED IN MG/24 HR) 0.2 mg/24hr patch Place 1 patch (0.2 mg total) onto the skin once a week. 06/04/18   Roxan Hockey, MD  cloNIDine (CATAPRES) 0.1 MG tablet Take 0.1 mg by mouth every 8 (eight) hours as needed (sbp >180).     [provider]  isosorbide mononitrate (IMDUR) 60 MG 24 hr tablet Take 60 mg by mouth daily.  03/24/15   [provider]  lamoTRIgine (LAMICTAL) 25 MG tablet Take 50 mg by mouth daily.     [provider]  latanoprost (XALATAN) 0.005 % ophthalmic solution Place 1 drop into both eyes at bedtime.     [provider]  loperamide (IMODIUM A-D) 2 MG tablet Take 4 mg by mouth as needed for diarrhea or loose stools.     [provider]  meropenem (MERREM) IVPB Inject 500 mg into the vein daily for 10 days. Indication:  Achromobacter Bacteremia Last Day of Therapy:  08/29/18 Labs - Once weekly:  CBC/D and BMP, Labs - Every other week:  ESR and CRP 08/20/18 08/30/18  Rai, Ripudeep K, MD  multivitamin (RENA-VIT) TABS tablet Take 1 tablet by mouth daily.    [provider]  ondansetron (ZOFRAN) 4 MG tablet Take 4 mg by mouth every 8 (eight) hours as needed for nausea or vomiting.    [provider]  ranitidine (ZANTAC) 300 MG tablet Take 300 mg by mouth at bedtime. Reported on 03/05/2016    [provider]  saccharomyces boulardii (FLORASTOR) 250 MG capsule Take 250 mg by mouth 2 (two) times daily as needed (loose stools).     [provider]  senna-docusate (SENEXON-S) 8.6-50 MG tablet Take  2 tablets by mouth at bedtime as needed (constipation).     [provider]  sevelamer carbonate (RENVELA) 800 MG tablet Take 2 tablets (1,600 mg total) by mouth 3 (three) times daily with meals. 05/30/18   Roxan Hockey, MD  simethicone (MYLICON) 786 MG chewable tablet Chew 125 mg by mouth 3 (three) times daily. Take at 8 AM, 11AM, 6PM    [provider]  Sodium Phosphates (FLEET ENEMA RE) Place 1 enema rectally daily as needed (constipation not relieved by bisacodyl suppository).    [provider]  valACYclovir (VALTREX) 500 MG tablet Take 1 tablet (500 mg total) by mouth every other day. 03/01/16   Hongalgi, Lenis Dickinson, MD    Family History Family History  Problem Relation Age of Onset  . Hypertension Mother   . Hypertension Father     Social History Social History   Tobacco Use  . Smoking status: Never Smoker  . Smokeless tobacco: Never Used  Substance Use Topics  . Alcohol use: No  Alcohol/week: 0.0 standard drinks  . Drug use: No     Allergies   Phenergan [promethazine hcl]   Review of Systems Review of Systems  All other systems reviewed and are negative.    Physical Exam Updated Vital Signs BP (!) 195/88 (BP Location: Right Leg)   Pulse (!) 55   Temp 97.6 F (36.4 C) (Oral)   Resp 16   SpO2 99%   Physical Exam  Nursing note and vitals reviewed.  77 year old female, resting comfortably and in no acute distress. Vital signs are significant for elevated blood pressure. Oxygen saturation is 99%, which is normal. Head is normocephalic and atraumatic. PERRLA, EOMI. Oropharynx is clear. Neck is nontender and supple without adenopathy or JVD. Back is nontender and there is no CVA tenderness. Lungs are clear without rales, wheezes, or rhonchi. Chest is nontender. Heart has regular rate and rhythm with 2/6 midsystolic murmur heard at the lower left sternal border. Abdomen is soft, flat, nontender without masses or hepatosplenomegaly and  peristalsis is normoactive. Extremities have no cyanosis or edema, full range of motion is present. Skin is warm and dry without rash. Neurologic: Mental status is normal, cranial nerves are intact, there are no motor or sensory deficits.  ED Treatments / Results  Labs (all labs ordered are listed, but only abnormal results are displayed) Labs Reviewed  CBC WITH DIFFERENTIAL/PLATELET - Abnormal; Notable for the following components:      Result Value   WBC 3.7 (*)    RBC 2.34 (*)    Hemoglobin 7.1 (*)    HCT 23.3 (*)    RDW 16.2 (*)    Lymphs Abs 0.6 (*)    All other components within normal limits  BASIC METABOLIC PANEL - Abnormal; Notable for the following components:   Sodium 122 (*)    Chloride 87 (*)    BUN 49 (*)    Creatinine, Ser 9.48 (*)    Calcium 8.3 (*)    GFR calc non Af Amer 3 (*)    GFR calc Af Amer 4 (*)    All other components within normal limits  BASIC METABOLIC PANEL - Abnormal; Notable for the following components:   Sodium 124 (*)    Chloride 89 (*)    CO2 21 (*)    BUN 49 (*)    Creatinine, Ser 9.59 (*)    Calcium 8.5 (*)    GFR calc non Af Amer 3 (*)    GFR calc Af Amer 4 (*)    All other components within normal limits  CBC - Abnormal; Notable for the following components:   Hemoglobin 11.6 (*)    RDW 17.5 (*)    All other components within normal limits  BASIC METABOLIC PANEL - Abnormal; Notable for the following components:   Sodium 122 (*)    Chloride 89 (*)    CO2 20 (*)    BUN 51 (*)    Creatinine, Ser 9.88 (*)    Calcium 8.2 (*)    GFR calc non Af Amer 3 (*)    GFR calc Af Amer 4 (*)    All other components within normal limits  TSH  CORTISOL  BASIC METABOLIC PANEL  HEAVY METALS, BLOOD  CBC  VITAMIN B12  TYPE AND SCREEN  PREPARE RBC (CROSSMATCH)   Procedures Procedures   Medications Ordered in ED Medications  amLODipine (NORVASC) tablet 10 mg (10 mg Oral Given 08/26/18 2147)  cloNIDine (CATAPRES - Dosed in mg/24 hr)  patch 0.2 mg (has no administration in time range)  isosorbide mononitrate (IMDUR) 24 hr tablet 60 mg (has no administration in time range)  bisacodyl (DULCOLAX) suppository 10 mg (has no administration in time range)  calcium acetate (PHOSLO) capsule 1,334 mg (1,334 mg Oral Not Given 08/26/18 1945)  famotidine (PEPCID) tablet 20 mg (20 mg Oral Given 08/26/18 2147)  saccharomyces boulardii (FLORASTOR) capsule 250 mg (has no administration in time range)  senna-docusate (Senokot-S) tablet 2 tablet (has no administration in time range)  sevelamer carbonate (RENVELA) tablet 1,600 mg (1,600 mg Oral Not Given 08/26/18 1945)  simethicone (MYLICON) chewable tablet 120 mg (120 mg Oral Not Given 08/26/18 1946)  lamoTRIgine (LAMICTAL) tablet 50 mg (has no administration in time range)  multivitamin (RENA-VIT) tablet 1 tablet (1 tablet Oral Given 08/26/18 2147)  latanoprost (XALATAN) 0.005 % ophthalmic solution 1 drop (has no administration in time range)  acetaminophen (TYLENOL) tablet 650 mg (has no administration in time range)    Or  acetaminophen (TYLENOL) suppository 650 mg (has no administration in time range)  ondansetron (ZOFRAN) tablet 4 mg (has no administration in time range)    Or  ondansetron (ZOFRAN) injection 4 mg (has no administration in time range)  hydrALAZINE (APRESOLINE) injection 5 mg (5 mg Intravenous Given 08/26/18 0320)  meropenem (MERREM) 500 mg in sodium chloride 0.9 % 100 mL IVPB (0 mg Intravenous Stopped 08/26/18 0715)  RESOURCE THICKENUP CLEAR (has no administration in time range)  losartan (COZAAR) tablet 50 mg (50 mg Oral Given 08/26/18 2147)  LORazepam (ATIVAN) tablet 0.5 mg (0.5 mg Oral Given 08/26/18 2147)  Chlorhexidine Gluconate Cloth 2 % PADS 6 each (has no administration in time range)  Chlorhexidine Gluconate Cloth 2 % PADS 6 each (has no administration in time range)  0.9 %  sodium chloride infusion (10 mL/hr Intravenous New Bag/Given 08/26/18 0316)    haloperidol lactate (HALDOL) injection 1 mg (1 mg Intravenous Given 08/26/18 1557)  haloperidol lactate (HALDOL) 5 MG/ML injection (  Duplicate 56/38/93 7342)    Initial Impression / Assessment and Plan / ED Course  I have reviewed the triage vital signs and the nursing notes.  Pertinent lab results that were available during my care of the patient were reviewed by me and considered in my medical decision making (see chart for details).  Symptomatic anemia with major cause for anemia end-stage renal disease.  Old records are reviewed confirming ED visit earlier today.  Apparently, she had left AGAINST MEDICAL ADVICE.  She is currently calm and cooperative.  Hemoglobin earlier today was 7.1.  It is also noted that she had significant hyponatremia with sodium of 123.  We will recheck electrolytes and obtain blood for crossmatch.  Plan on observation admission for purpose of blood transfusion.  Case is discussed with Dr. Hal Hope of [facility], who agrees to admit the patient.  Final Clinical Impressions(s) / ED Diagnoses   Final diagnoses:  Symptomatic anemia  End-stage renal disease on hemodialysis [facility])  Hyponatremia    ED Discharge Orders    None       Delora Fuel, MD 87/68/11 2246

## 2018-08-26 ENCOUNTER — Encounter (HOSPITAL_COMMUNITY): Payer: Self-pay | Admitting: Internal Medicine

## 2018-08-26 DIAGNOSIS — Z888 Allergy status to other drugs, medicaments and biological substances status: Secondary | ICD-10-CM | POA: Diagnosis not present

## 2018-08-26 DIAGNOSIS — Z992 Dependence on renal dialysis: Secondary | ICD-10-CM | POA: Diagnosis not present

## 2018-08-26 DIAGNOSIS — R531 Weakness: Secondary | ICD-10-CM | POA: Diagnosis present

## 2018-08-26 DIAGNOSIS — R1310 Dysphagia, unspecified: Secondary | ICD-10-CM | POA: Diagnosis present

## 2018-08-26 DIAGNOSIS — T68XXXA Hypothermia, initial encounter: Secondary | ICD-10-CM | POA: Diagnosis present

## 2018-08-26 DIAGNOSIS — I160 Hypertensive urgency: Secondary | ICD-10-CM

## 2018-08-26 DIAGNOSIS — Z9181 History of falling: Secondary | ICD-10-CM | POA: Diagnosis not present

## 2018-08-26 DIAGNOSIS — R7881 Bacteremia: Secondary | ICD-10-CM | POA: Diagnosis not present

## 2018-08-26 DIAGNOSIS — D500 Iron deficiency anemia secondary to blood loss (chronic): Secondary | ICD-10-CM | POA: Diagnosis present

## 2018-08-26 DIAGNOSIS — R001 Bradycardia, unspecified: Secondary | ICD-10-CM | POA: Diagnosis present

## 2018-08-26 DIAGNOSIS — R29818 Other symptoms and signs involving the nervous system: Secondary | ICD-10-CM | POA: Diagnosis not present

## 2018-08-26 DIAGNOSIS — Z8249 Family history of ischemic heart disease and other diseases of the circulatory system: Secondary | ICD-10-CM | POA: Diagnosis not present

## 2018-08-26 DIAGNOSIS — N186 End stage renal disease: Secondary | ICD-10-CM | POA: Diagnosis present

## 2018-08-26 DIAGNOSIS — I120 Hypertensive chronic kidney disease with stage 5 chronic kidney disease or end stage renal disease: Secondary | ICD-10-CM | POA: Diagnosis present

## 2018-08-26 DIAGNOSIS — Y832 Surgical operation with anastomosis, bypass or graft as the cause of abnormal reaction of the patient, or of later complication, without mention of misadventure at the time of the procedure: Secondary | ICD-10-CM | POA: Diagnosis present

## 2018-08-26 DIAGNOSIS — K219 Gastro-esophageal reflux disease without esophagitis: Secondary | ICD-10-CM | POA: Diagnosis present

## 2018-08-26 DIAGNOSIS — Z1629 Resistance to other single specified antibiotic: Secondary | ICD-10-CM | POA: Diagnosis not present

## 2018-08-26 DIAGNOSIS — Z8701 Personal history of pneumonia (recurrent): Secondary | ICD-10-CM | POA: Diagnosis not present

## 2018-08-26 DIAGNOSIS — E871 Hypo-osmolality and hyponatremia: Secondary | ICD-10-CM | POA: Diagnosis present

## 2018-08-26 DIAGNOSIS — Z7189 Other specified counseling: Secondary | ICD-10-CM | POA: Diagnosis not present

## 2018-08-26 DIAGNOSIS — T82868A Thrombosis of vascular prosthetic devices, implants and grafts, initial encounter: Secondary | ICD-10-CM | POA: Diagnosis present

## 2018-08-26 DIAGNOSIS — N2581 Secondary hyperparathyroidism of renal origin: Secondary | ICD-10-CM | POA: Diagnosis present

## 2018-08-26 DIAGNOSIS — C9000 Multiple myeloma not having achieved remission: Secondary | ICD-10-CM | POA: Diagnosis present

## 2018-08-26 DIAGNOSIS — D631 Anemia in chronic kidney disease: Secondary | ICD-10-CM | POA: Diagnosis present

## 2018-08-26 DIAGNOSIS — N189 Chronic kidney disease, unspecified: Secondary | ICD-10-CM | POA: Diagnosis present

## 2018-08-26 DIAGNOSIS — A4159 Other Gram-negative sepsis: Secondary | ICD-10-CM | POA: Diagnosis present

## 2018-08-26 DIAGNOSIS — Z515 Encounter for palliative care: Secondary | ICD-10-CM | POA: Diagnosis not present

## 2018-08-26 DIAGNOSIS — R4189 Other symptoms and signs involving cognitive functions and awareness: Secondary | ICD-10-CM | POA: Diagnosis present

## 2018-08-26 DIAGNOSIS — D801 Nonfamilial hypogammaglobulinemia: Secondary | ICD-10-CM | POA: Diagnosis present

## 2018-08-26 DIAGNOSIS — B9689 Other specified bacterial agents as the cause of diseases classified elsewhere: Secondary | ICD-10-CM | POA: Diagnosis present

## 2018-08-26 DIAGNOSIS — D649 Anemia, unspecified: Secondary | ICD-10-CM | POA: Diagnosis present

## 2018-08-26 DIAGNOSIS — K922 Gastrointestinal hemorrhage, unspecified: Secondary | ICD-10-CM | POA: Diagnosis present

## 2018-08-26 DIAGNOSIS — F0390 Unspecified dementia without behavioral disturbance: Secondary | ICD-10-CM | POA: Diagnosis present

## 2018-08-26 DIAGNOSIS — Z1624 Resistance to multiple antibiotics: Secondary | ICD-10-CM | POA: Diagnosis present

## 2018-08-26 DIAGNOSIS — I361 Nonrheumatic tricuspid (valve) insufficiency: Secondary | ICD-10-CM | POA: Diagnosis not present

## 2018-08-26 LAB — BASIC METABOLIC PANEL
Anion gap: 14 (ref 5–15)
Anion gap: 13 (ref 5–15)
Anion gap: 12 (ref 5–15)
BUN: 49 mg/dL — ABNORMAL HIGH (ref 8–23)
BUN: 51 mg/dL — ABNORMAL HIGH (ref 8–23)
BUN: 49 mg/dL — AB (ref 8–23)
CALCIUM: 8.2 mg/dL — AB (ref 8.9–10.3)
CALCIUM: 8.5 mg/dL — AB (ref 8.9–10.3)
CO2: 21 mmol/L — ABNORMAL LOW (ref 22–32)
CO2: 20 mmol/L — ABNORMAL LOW (ref 22–32)
CO2: 22 mmol/L (ref 22–32)
CREATININE: 9.93 mg/dL — AB (ref 0.44–1.00)
Calcium: 8.5 mg/dL — ABNORMAL LOW (ref 8.9–10.3)
Chloride: 89 mmol/L — ABNORMAL LOW (ref 98–111)
Chloride: 89 mmol/L — ABNORMAL LOW (ref 98–111)
Chloride: 86 mmol/L — ABNORMAL LOW (ref 98–111)
Creatinine, Ser: 9.59 mg/dL — ABNORMAL HIGH (ref 0.44–1.00)
Creatinine, Ser: 9.88 mg/dL — ABNORMAL HIGH (ref 0.44–1.00)
GFR calc Af Amer: 4 mL/min — ABNORMAL LOW (ref >60)
GFR calc Af Amer: 4 mL/min — ABNORMAL LOW (ref >60)
GFR, EST AFRICAN AMERICAN: 4 mL/min — AB (ref >60)
GFR, EST NON AFRICAN AMERICAN: 3 mL/min — AB (ref >60)
GFR, EST NON AFRICAN AMERICAN: 3 mL/min — AB (ref >60)
GFR, EST NON AFRICAN AMERICAN: 3 mL/min — AB (ref >60)
GLUCOSE: 96 mg/dL (ref 70–99)
GLUCOSE: 81 mg/dL (ref 70–99)
Glucose, Bld: 84 mg/dL (ref 70–99)
POTASSIUM: 4.1 mmol/L (ref 3.5–5.1)
POTASSIUM: 4.5 mmol/L (ref 3.5–5.1)
POTASSIUM: 4.9 mmol/L (ref 3.5–5.1)
SODIUM: 124 mmol/L — AB (ref 135–145)
SODIUM: 122 mmol/L — AB (ref 135–145)
SODIUM: 120 mmol/L — AB (ref 135–145)

## 2018-08-26 LAB — CBC
HEMATOCRIT: 36.3 % (ref 36.0–46.0)
Hemoglobin: 11.6 g/dL — ABNORMAL LOW (ref 12.0–15.0)
MCH: 29.7 pg (ref 26.0–34.0)
MCHC: 32 g/dL (ref 30.0–36.0)
MCV: 93.1 fL (ref 80.0–100.0)
NRBC: 0 % (ref 0.0–0.2)
Platelets: 263 10*3/uL (ref 150–400)
RBC: 3.9 MIL/uL (ref 3.87–5.11)
RDW: 17.5 % — AB (ref 11.5–15.5)
WBC: 4 10*3/uL (ref 4.0–10.5)

## 2018-08-26 LAB — CORTISOL: CORTISOL PLASMA: 12.3 ug/dL

## 2018-08-26 LAB — TYPE AND SCREEN
ABO/RH(D): O POS
Antibody Screen: NEGATIVE

## 2018-08-26 LAB — TSH: TSH: 2.777 u[IU]/mL (ref 0.350–4.500)

## 2018-08-26 MED ORDER — SENNOSIDES-DOCUSATE SODIUM 8.6-50 MG PO TABS: 2.0000 | ORAL_TABLET | Freq: Every evening | ORAL | Status: DC | PRN

## 2018-08-26 MED ORDER — MEROPENEM IV (FOR PTA / DISCHARGE USE ONLY): 500.0000 mg | INTRAVENOUS | Status: DC

## 2018-08-26 MED ORDER — LATANOPROST 0.005 % OP SOLN
1.0000 [drp] | Freq: Every day | OPHTHALMIC | Status: DC
  Administered 2018-08-27 – 2018-09-01 (×5): 1 [drp] via OPHTHALMIC
  Filled 2018-08-26 (×2): qty 2.5

## 2018-08-26 MED ORDER — SACCHAROMYCES BOULARDII 250 MG PO CAPS
250.0000 mg | ORAL_CAPSULE | Freq: Two times a day (BID) | ORAL | Status: DC | PRN
  Filled 2018-08-26: qty 1

## 2018-08-26 MED ORDER — ONDANSETRON HCL 4 MG/2ML IJ SOLN
4.0000 mg | Freq: Four times a day (QID) | INTRAMUSCULAR | Status: DC | PRN
  Administered 2018-08-31: 4 mg via INTRAVENOUS
  Filled 2018-08-26: qty 2

## 2018-08-26 MED ORDER — BISACODYL 10 MG RE SUPP: 10.0000 mg | Freq: Every day | RECTAL | Status: DC | PRN

## 2018-08-26 MED ORDER — HALOPERIDOL LACTATE 5 MG/ML IJ SOLN
INTRAMUSCULAR | Status: AC
  Filled 2018-08-26: qty 1

## 2018-08-26 MED ORDER — CHLORHEXIDINE GLUCONATE CLOTH 2 % EX PADS
6.0000 | MEDICATED_PAD | Freq: Every day | CUTANEOUS | Status: DC
  Administered 2018-08-28: 6 via TOPICAL

## 2018-08-26 MED ORDER — LORAZEPAM 0.5 MG PO TABS
0.5000 mg | ORAL_TABLET | Freq: Every day | ORAL | Status: DC
  Administered 2018-08-26 – 2018-08-31 (×6): 0.5 mg via ORAL
  Filled 2018-08-26 (×6): qty 1

## 2018-08-26 MED ORDER — ISOSORBIDE MONONITRATE ER 60 MG PO TB24
60.0000 mg | ORAL_TABLET | Freq: Every day | ORAL | Status: DC
  Administered 2018-08-27 – 2018-09-02 (×7): 60 mg via ORAL
  Filled 2018-08-26 (×7): qty 1

## 2018-08-26 MED ORDER — HYDRALAZINE HCL 20 MG/ML IJ SOLN
5.0000 mg | INTRAMUSCULAR | Status: DC | PRN
  Administered 2018-08-26 – 2018-09-01 (×2): 5 mg via INTRAVENOUS
  Filled 2018-08-26: qty 1

## 2018-08-26 MED ORDER — OLANZAPINE 5 MG PO TBDP
5.0000 mg | ORAL_TABLET | Freq: Two times a day (BID) | ORAL | Status: DC | PRN
  Filled 2018-08-26: qty 1

## 2018-08-26 MED ORDER — HALOPERIDOL LACTATE 5 MG/ML IJ SOLN
1.0000 mg | Freq: Once | INTRAMUSCULAR | Status: AC
  Administered 2018-08-26: 1 mg via INTRAVENOUS

## 2018-08-26 MED ORDER — LAMOTRIGINE 100 MG PO TABS
50.0000 mg | ORAL_TABLET | Freq: Every day | ORAL | Status: DC
  Administered 2018-08-27 – 2018-09-02 (×7): 50 mg via ORAL
  Filled 2018-08-26 (×6): qty 1
  Filled 2018-08-26: qty 2
  Filled 2018-08-26: qty 1

## 2018-08-26 MED ORDER — HYDRALAZINE HCL 20 MG/ML IJ SOLN
INTRAMUSCULAR | Status: AC
  Administered 2018-09-01: 5 mg via INTRAVENOUS
  Filled 2018-08-26: qty 1

## 2018-08-26 MED ORDER — CLONIDINE HCL 0.2 MG/24HR TD PTWK
0.2000 mg | MEDICATED_PATCH | TRANSDERMAL | Status: DC
  Administered 2018-08-27: 0.2 mg via TRANSDERMAL
  Filled 2018-08-26: qty 1

## 2018-08-26 MED ORDER — RESOURCE THICKENUP CLEAR PO POWD
ORAL | Status: DC | PRN
  Filled 2018-08-26: qty 125

## 2018-08-26 MED ORDER — ACETAMINOPHEN 650 MG RE SUPP: 650.0000 mg | Freq: Four times a day (QID) | RECTAL | Status: DC | PRN

## 2018-08-26 MED ORDER — SODIUM CHLORIDE 0.9 % IV SOLN
500.0000 mg | INTRAVENOUS | Status: DC
  Administered 2018-08-26 – 2018-08-27 (×2): 500 mg via INTRAVENOUS
  Filled 2018-08-26 (×3): qty 0.5

## 2018-08-26 MED ORDER — FAMOTIDINE 20 MG PO TABS
20.0000 mg | ORAL_TABLET | Freq: Two times a day (BID) | ORAL | Status: DC
  Administered 2018-08-26 – 2018-08-29 (×6): 20 mg via ORAL
  Filled 2018-08-26 (×6): qty 1

## 2018-08-26 MED ORDER — RENA-VITE PO TABS
1.0000 | ORAL_TABLET | Freq: Every day | ORAL | Status: DC
  Administered 2018-08-26 – 2018-09-01 (×7): 1 via ORAL
  Filled 2018-08-26 (×7): qty 1

## 2018-08-26 MED ORDER — CALCIUM ACETATE (PHOS BINDER) 667 MG PO CAPS
1334.0000 mg | ORAL_CAPSULE | Freq: Three times a day (TID) | ORAL | Status: DC
  Administered 2018-08-27 – 2018-09-02 (×13): 1334 mg via ORAL
  Filled 2018-08-26 (×15): qty 2

## 2018-08-26 MED ORDER — CHLORHEXIDINE GLUCONATE CLOTH 2 % EX PADS: 6.0000 | MEDICATED_PAD | Freq: Every day | CUTANEOUS | Status: DC

## 2018-08-26 MED ORDER — HEPARIN SODIUM (PORCINE) 5000 UNIT/ML IJ SOLN: 5000.0000 [IU] | Freq: Three times a day (TID) | INTRAMUSCULAR | Status: DC

## 2018-08-26 MED ORDER — ACETAMINOPHEN 325 MG PO TABS
650.0000 mg | ORAL_TABLET | Freq: Four times a day (QID) | ORAL | Status: DC | PRN
  Administered 2018-08-28 (×2): 650 mg via ORAL
  Filled 2018-08-26 (×2): qty 2

## 2018-08-26 MED ORDER — LOSARTAN POTASSIUM 50 MG PO TABS
50.0000 mg | ORAL_TABLET | Freq: Every evening | ORAL | Status: DC
  Administered 2018-08-26 – 2018-09-02 (×7): 50 mg via ORAL
  Filled 2018-08-26 (×7): qty 1

## 2018-08-26 MED ORDER — AMLODIPINE BESYLATE 10 MG PO TABS
10.0000 mg | ORAL_TABLET | Freq: Every evening | ORAL | Status: DC
  Administered 2018-08-26 – 2018-09-02 (×7): 10 mg via ORAL
  Filled 2018-08-26 (×7): qty 1

## 2018-08-26 MED ORDER — SEVELAMER CARBONATE 800 MG PO TABS
1600.0000 mg | ORAL_TABLET | Freq: Three times a day (TID) | ORAL | Status: DC
  Administered 2018-08-27 – 2018-09-02 (×13): 1600 mg via ORAL
  Filled 2018-08-26 (×15): qty 2

## 2018-08-26 MED ORDER — SIMETHICONE 80 MG PO CHEW
125.0000 mg | CHEWABLE_TABLET | Freq: Three times a day (TID) | ORAL | Status: DC
  Administered 2018-08-27 – 2018-09-02 (×15): 120 mg via ORAL
  Administered 2018-09-02: 40 mg via ORAL
  Filled 2018-08-26 (×19): qty 2

## 2018-08-26 MED ORDER — ONDANSETRON HCL 4 MG PO TABS
4.0000 mg | ORAL_TABLET | Freq: Four times a day (QID) | ORAL | Status: DC | PRN
  Administered 2018-09-01: 4 mg via ORAL
  Filled 2018-08-26: qty 1

## 2018-08-26 NOTE — Consult Note (Signed)
Renal Service Consult Note [facility]  Martin Smeal 08/26/2018 [place] Requesting Physician:  Dr Eliseo Squires  Reason for Consult:  ESRD pt gen weakness/ anemia HPI: The patient is a 77 y.o. year-old with hx of HTN, multiple myeloma and ESRD on HD admitted overnight last night for gen'd weakness and anemia w/ Hb 7.  Stool was guiac+ in ED. Admitted and transfused overnight. Pt was confused and head CT was negative. BP's were high and she rec'd IV bp medication.   We are asked to see for ESRD.    Sent to ED from [facility] for gen'd weakness.  In ED serum Na was low at 122, Hb 7, tmep 94.5 started on sepsis protocol.  This am is upset and unpleasant initially then a bit more relaxed.   Denies any SOB/ CP/ abd pain.      Echart:   03/2014 - anemia, esrd, hx MM, BM bx by onc  10/2014 - hcap, esrd  04/2015 - AMS, MM on chemoRx, SNF placement  09/2015 - distal femur fx  03/2016 - neutropenic fever/ grp B strep sepsis+ septic arthritis L knee, TEE +MV veg  03/2016 - anemia  03/2017 - hcap  03/2018 - AMS, MM on chemo at [facility], esrd on HD, dysphagia  05/2018 - AMS, CT/ MR neg, MM on carfilzomib  08/2018 - AMS, fall, Achromobacter bacteremia prob sec to UTI; dc SNF 2 wk meropenem iv   ROS  n/a   Past Medical History  Past Medical History:  Diagnosis Date  . Anxiety   . Closed fracture of right distal femur ([place]) 09/01/2015  . Depression   . ESRD (end stage renal disease) on dialysis [facility])    "TTS; [facility]" (04/24/2017)  . GERD (gastroesophageal reflux disease) 04/23/2015  . HCAP (healthcare-associated pneumonia) 04/24/2017  . Heart murmur   . History of blood transfusion    "low HgB when I was going to dialysis center"  . Hypertension   . Hypertension associated with end stage renal disease on dialysis 03/11/2014  . Malnutrition of moderate degree 09/02/2015  . Multiple myeloma ([place]) 03/11/2014  . Patient is Jehovah's Witness    "I'd rather never have any blood  transfusions unless absolutely necessary; please check with me 1st". (04/24/2017)  . Pneumonia ~ 2016   Past Surgical History  Past Surgical History:  Procedure Laterality Date  . AV FISTULA PLACEMENT Left   . I&D EXTREMITY Left 02/27/2016   Procedure: ARTHROSCOPIC IRRIGATION AND DEBRIDEMENT EXTREMITY;  Surgeon: Renette Butters, MD;  Location: [place];  Service: Orthopedics;  Laterality: Left;  . TEE WITHOUT CARDIOVERSION N/A 02/29/2016   Procedure: TRANSESOPHAGEAL ECHOCARDIOGRAM (TEE);  Surgeon: Thayer Headings, MD;  Location: [facility] ENDOSCOPY;  Service: Cardiovascular;  Laterality: N/A;   Family History  Family History  Problem Relation Age of Onset  . Hypertension Mother   . Hypertension Father    Social History  reports that she has never smoked. She has never used smokeless tobacco. She reports that she does not drink alcohol or use drugs. Allergies  Allergies  Allergen Reactions  . Phenergan [Promethazine Hcl] Other (See Comments)    Acute encephalopathy in the context of refusal to go to hemodialysis and administration of Phenergan for nausea and vomiting   Home medications Prior to Admission medications   Medication Sig Start Date End Date Taking? Authorizing Provider  acetaminophen (TYLENOL) 325 MG tablet Take 650 mg by mouth every 6 (six) hours as needed for fever or headache (  pain). Do not exceed 3000 mg in 24 hours   Yes [provider]  amLODipine (NORVASC) 10 MG tablet Take 10 mg by mouth every evening. Hold for SBP <100MM/HG   Yes [provider]  bisacodyl (DULCOLAX) 10 MG suppository Place 10 mg rectally daily as needed for moderate constipation (constipation not relieved by MOM).   Yes [provider]  calcium acetate (PHOSLO) 667 MG capsule Take 1,334 mg by mouth 3 (three) times daily with meals.    Yes [provider]  cloNIDine (CATAPRES - DOSED IN MG/24 HR) 0.2 mg/24hr patch Place 1 patch (0.2 mg total) onto the skin once a week.  06/04/18  Yes Emokpae, Courage, MD  cloNIDine (CATAPRES) 0.1 MG tablet Take 0.1 mg by mouth every 8 (eight) hours as needed (sbp >180).    Yes [provider]  hydrocortisone cream 1 % Apply 1 application topically daily as needed for itching.   Yes [provider]  isosorbide mononitrate (IMDUR) 60 MG 24 hr tablet Take 60 mg by mouth daily.  03/24/15  Yes [provider]  lamoTRIgine (LAMICTAL) 25 MG tablet Take 50 mg by mouth daily.    Yes [provider]  latanoprost (XALATAN) 0.005 % ophthalmic solution Place 1 drop into both eyes at bedtime.    Yes [provider]  loperamide (IMODIUM A-D) 2 MG tablet Take 4 mg by mouth daily as needed (on Dialysis days).    Yes [provider]  LORazepam (ATIVAN) 0.5 MG tablet Take 0.5 mg by mouth at bedtime.   Yes [provider]  losartan (COZAAR) 50 MG tablet Take 50 mg by mouth every evening.   Yes [provider]  meropenem (MERREM) IVPB Inject 500 mg into the vein daily for 10 days. Indication:  Achromobacter Bacteremia Last Day of Therapy:  08/29/18 Labs - Once weekly:  CBC/D and BMP, Labs - Every other week:  ESR and CRP 08/20/18 08/30/18 Yes Rai, Ripudeep K, MD  multivitamin (RENA-VIT) TABS tablet Take 1 tablet by mouth daily.   Yes [provider]  ranitidine (ZANTAC) 300 MG tablet Take 300 mg by mouth at bedtime. Reported on 03/05/2016   Yes [provider]  sertraline (ZOLOFT) 50 MG tablet Take 50 mg by mouth daily.   Yes [provider]  sevelamer carbonate (RENVELA) 800 MG tablet Take 2 tablets (1,600 mg total) by mouth 3 (three) times daily with meals. 05/30/18  Yes Emokpae, Courage, MD  simethicone (MYLICON) 638 MG chewable tablet Chew 125 mg by mouth daily. 6PM   Yes [provider]  Sodium Phosphates (FLEET ENEMA RE) Place 1 enema rectally daily as needed (constipation not relieved by bisacodyl suppository).   Yes [provider]   valACYclovir (VALTREX) 500 MG tablet Take 1 tablet (500 mg total) by mouth every other day. 03/01/16  Yes Modena Jansky, MD   Liver Function Tests Recent Labs  Lab 08/25/18 1607  AST 16  ALT 8  ALKPHOS 44  BILITOT 0.8  PROT 8.2*  ALBUMIN 2.7*   No results for input(s): LIPASE, AMYLASE in the last 168 hours. CBC Recent Labs  Lab 08/25/18 1607 08/25/18 2301 08/26/18 0558  WBC 3.9* 3.7* 4.0  NEUTROABS 2.9 2.6  --   HGB 7.1* 7.1* 11.6*  HCT 23.6* 23.3* 36.3  MCV 100.4* 99.6 93.1  PLT 234 228 466   Basic Metabolic Panel Recent Labs  Lab 08/25/18 1607 08/25/18 2301 08/26/18 0558  NA 123* 122* 124*  K 4.3 4.4 4.1  CL 86* 87* 89*  CO2 23 23 21*  GLUCOSE 104* 85 84  BUN 45* 49* 49*  CREATININE 9.21* 9.48* 9.59*  CALCIUM 8.5* 8.3* 8.5*   Iron/TIBC/Ferritin/ %Sat    Component Value Date/Time   IRON 149 (H) 03/13/2014 0442   TIBC NOT CALC 03/13/2014 0442   FERRITIN 2,786 (H) 03/13/2014 0442   IRONPCTSAT NOT CALC 03/13/2014 0442    Vitals:   08/26/18 0630 08/26/18 0700 08/26/18 0730 08/26/18 0804  BP: (!) 163/73 (!) 161/79 (!) 152/84 140/87  Pulse: 61  64   Resp: '13 14 11   ' Temp:    (!) 94.5 F (34.7 C)  TempSrc:    Rectal  SpO2: 99%  99%    Exam Gen looks confused, chron ill, tired and weak No rash, cyanosis or gangrene Sclera anicteric, throat clear   No jvd or bruits Chest clear bilat to bases RRR no MRG Abd soft ntnd no mass or ascites +bs GU defer MS no joint effusions or deformity Ext 1+ bilat LE edema, no wounds or ulcers Neuro is alert, Ox 2, nonfocal LUA AVF not good bruit, has a pulse though    Home meds:  - clonidine patch 0.2 qwk/ clonidine 0.1 tid prn/ isosorbide mononitrate 60 qd/ amlodipine 10  - sevelamer carbonate 1.6gm ac tid/ calcium acetate 1334 ac  - lamotrigine 50 qd  - ranitidine 300 hs/ prns/ vitamins/ eyedrops  Dialysis: TTS   3.5h   425/A1.5  58kg  2/2.25  Hep none   LUA AVF - hectorol 3 ug  - epo alfa 18000 tiw  IV  - goes to all HD sessions but signs off early frequently   Na 122 CO2 20  BUN 51  Cr 9.88  K4.5  CA 8.2   WBC 4k  Hb 11.6  cortisol 12.3  Glu 96  tsh 2.77  CXR 11/25 > clear, no CHF     Impression/ Plan: 1. Gen weakness - prob multifactorial, r/o sepsis,hyponatremia, signing off early from HD.  Plan HD today and tomorrow in case of uremia and do get vol down and serum Na+ up.   2. AMS - recurrent issue recently , may be developed some dementia, have d/w daughter on the phone, she is worried about dementia as well.  Will check B12, TSH/ cortisol already ordered and wnl.  May be FTT as well in adult.  3. ESRD on HD TTS 4. Volume - suspected ^^volume w/ low Na, see #1 5. MBD ckd - cont meds 6. Anemia ckd - cont esa prn, Hb 11 7. HTN - cont meds, get vol down     Kelly Splinter MD [place] pager [phone]   08/26/2018, 11:40 AM

## 2018-08-26 NOTE — H&P (Signed)
 History and Physical    [name] MRN:[id number] DOB: 10/04/1940 DOA: 08/25/2018  PCP: Hopper, William F, MD  Patient coming from: Skilled nursing facility.  Chief Complaint: Weakness.  Anemia.  HPI: [name] is a 77 y.o. female with history of ESRD on hemodialysis Tuesday Thursday Saturday with some neuro cognitive deficits and dysphagia who was recently admitted for bacteremia and is on meropenem until August 29, 2018 was referred to the ER from dialysis center after patient was found to be hypotensive and weak complaining of bilateral arm pain.  In the ER initially patient was found to be hypotensive and a hemoglobin was around 7 sodium of 123.  Stool for occult blood was positive since patient had some confusion CT head was done which did not show anything acute.  Patient refused transfusion because she was Jehovah's Witness and discharged back to skilled nursing facility.  Patient changed her mind and came back to the ER again.  ED Course: In the ER patient at this time is not hypotensive but hypotensive and agree for transfusion and 2 units of PRBC has been ordered.  Repeat labs show hemoglobin of 7 sodium 121.  On my exam patient has difficulty expressing but is oriented to place and follows commands but generally weak.  Review of Systems: As per HPI, rest all negative.   Past Medical History:  Diagnosis Date  . Anxiety   . Closed fracture of right distal femur (HCC) 09/01/2015  . Depression   . ESRD (end stage renal disease) on dialysis (HCC)    "TTS; [facility]" (04/24/2017)  . GERD (gastroesophageal reflux disease) 04/23/2015  . HCAP (healthcare-associated pneumonia) 04/24/2017  . Heart murmur   . History of blood transfusion    "low HgB when I was going to dialysis center"  . Hypertension   . Hypertension associated with end stage renal disease on dialysis 03/11/2014  . Malnutrition of moderate degree 09/02/2015  . Multiple myeloma (HCC) 03/11/2014  . Patient is  Jehovah's Witness    "I'd rather never have any blood transfusions unless absolutely necessary; please check with me 1st". (04/24/2017)  . Pneumonia ~ 2016    Past Surgical History:  Procedure Laterality Date  . AV FISTULA PLACEMENT Left   . I&D EXTREMITY Left 02/27/2016   Procedure: ARTHROSCOPIC IRRIGATION AND DEBRIDEMENT EXTREMITY;  Surgeon: Timothy D Murphy, MD;  Location: [facility] OR;  Service: Orthopedics;  Laterality: Left;  . TEE WITHOUT CARDIOVERSION N/A 02/29/2016   Procedure: TRANSESOPHAGEAL ECHOCARDIOGRAM (TEE);  Surgeon: Philip J Nahser, MD;  Location: [facility] ENDOSCOPY;  Service: Cardiovascular;  Laterality: N/A;     reports that she has never smoked. She has never used smokeless tobacco. She reports that she does not drink alcohol or use drugs.  Allergies  Allergen Reactions  . Phenergan [Promethazine Hcl] Other (See Comments)    Acute encephalopathy in the context of refusal to go to hemodialysis and administration of Phenergan for nausea and vomiting    Family History  Problem Relation Age of Onset  . Hypertension Mother   . Hypertension Father     Prior to Admission medications   Medication Sig Start Date End Date Taking? Authorizing Provider  acetaminophen (TYLENOL) 325 MG tablet Take 650 mg by mouth every 6 (six) hours as needed for fever or headache (pain). Do not exceed 3000 mg in 24 hours    [provider]  amLODipine (NORVASC) 10 MG tablet Take 10 mg by mouth every evening. Hold for SBP <100MM/HG      [provider]  bisacodyl (DULCOLAX) 10 MG suppository Place 10 mg rectally daily as needed for moderate constipation (constipation not relieved by MOM).    [provider]  calcium acetate (PHOSLO) 667 MG capsule Take 1,334 mg by mouth 3 (three) times daily with meals.     [provider]  cloNIDine (CATAPRES - DOSED IN MG/24 HR) 0.2 mg/24hr patch Place 1 patch (0.2 mg total) onto the skin once a week. 06/04/18   Roxan Hockey, MD    cloNIDine (CATAPRES) 0.1 MG tablet Take 0.1 mg by mouth every 8 (eight) hours as needed (sbp >180).     [provider]  isosorbide mononitrate (IMDUR) 60 MG 24 hr tablet Take 60 mg by mouth daily.  03/24/15   [provider]  lamoTRIgine (LAMICTAL) 25 MG tablet Take 50 mg by mouth daily.     [provider]  latanoprost (XALATAN) 0.005 % ophthalmic solution Place 1 drop into both eyes at bedtime.     [provider]  loperamide (IMODIUM A-D) 2 MG tablet Take 4 mg by mouth as needed for diarrhea or loose stools.     [provider]  meropenem (MERREM) IVPB Inject 500 mg into the vein daily for 10 days. Indication:  Achromobacter Bacteremia Last Day of Therapy:  08/29/18 Labs - Once weekly:  CBC/D and BMP, Labs - Every other week:  ESR and CRP 08/20/18 08/30/18  Rai, Ripudeep K, MD  multivitamin (RENA-VIT) TABS tablet Take 1 tablet by mouth daily.    [provider]  ondansetron (ZOFRAN) 4 MG tablet Take 4 mg by mouth every 8 (eight) hours as needed for nausea or vomiting.    [provider]  ranitidine (ZANTAC) 300 MG tablet Take 300 mg by mouth at bedtime. Reported on 03/05/2016    [provider]  saccharomyces boulardii (FLORASTOR) 250 MG capsule Take 250 mg by mouth 2 (two) times daily as needed (loose stools).     [provider]  senna-docusate (SENEXON-S) 8.6-50 MG tablet Take 2 tablets by mouth at bedtime as needed (constipation).     [provider]  sevelamer carbonate (RENVELA) 800 MG tablet Take 2 tablets (1,600 mg total) by mouth 3 (three) times daily with meals. 05/30/18   Roxan Hockey, MD  simethicone (MYLICON) 924 MG chewable tablet Chew 125 mg by mouth 3 (three) times daily. Take at 8 AM, 11AM, 6PM    [provider]  Sodium Phosphates (FLEET ENEMA RE) Place 1 enema rectally daily as needed (constipation not relieved by bisacodyl suppository).    [provider]   valACYclovir (VALTREX) 500 MG tablet Take 1 tablet (500 mg total) by mouth every other day. 03/01/16   Modena Jansky, MD    Physical Exam: Vitals:   08/26/18 0130 08/26/18 0142 08/26/18 0145 08/26/18 0210  BP: (!) 163/76 (!) 168/74 (!) 164/84 (!) 192/78  Pulse: (!) 44 (!) 54 (!) 45 (!) 53  Resp: _0 Temp:  (!) 97.2 F (36.2 C)  97.6 F (36.4 C)  TempSrc:  Oral  Oral  SpO2: 100% 99% 100% 99%      Constitutional: Moderately built and nourished. Vitals:   08/26/18 0130 08/26/18 0142 08/26/18 0145 08/26/18 0210  BP: (!) 163/76 (!) 168/74 (!) 164/84 (!) 192/78  Pulse: (!) 44 (!) 54 (!) 45 (!) 53  Resp: _1 Temp:  (!) 97.2 F (36.2 C)  97.6 F (36.4 C)  TempSrc:  Oral  Oral  SpO2: 100% 99% 100% 99%   Eyes: Anicteric mild pallor. ENMT: No discharge from the ears eyes nose or mouth. Neck: No mass felt.  No neck rigidity. Respiratory: No rhonchi or crepitations. Cardiovascular: S1-S2 heard. Abdomen: Soft nontender bowel sounds present. Musculoskeletal: No edema. Skin: No rash. Neurologic: Mildly drowsy but oriented to name and place.  Moves all extremities but generally weak. Psychiatric: Mildly confused.   Labs on Admission: I have personally reviewed following labs and imaging studies  CBC: Recent Labs  Lab 08/19/18 0724 08/25/18 1607 08/25/18 2301  WBC 5.2 3.9* 3.7*  NEUTROABS  --  2.9 2.6  HGB 8.4* 7.1* 7.1*  HCT 28.8* 23.6* 23.3*  MCV 102.9* 100.4* 99.6  PLT 209 234 308   Basic Metabolic Panel: Recent Labs  Lab 08/19/18 0724 08/25/18 1607 08/25/18 2301  NA 128* 123* 122*  K 3.5 4.3 4.4  CL 95* 86* 87*  CO2 _0 GLUCOSE 82 104* 85  BUN 52* 45* 49*  CREATININE 8.13* 9.21* 9.48*  CALCIUM 8.8* 8.5* 8.3*  PHOS 5.7*  --   --    GFR: Estimated Creatinine Clearance: 4 mL/min (A) (by C-G formula based on SCr of 9.48 mg/dL (H)). Liver Function Tests: Recent Labs  Lab 08/19/18 0724 08/25/18 1607  AST  --  16  ALT  --  8   ALKPHOS  --  44  BILITOT  --  0.8  PROT  --  8.2*  ALBUMIN 2.2* 2.7*   No results for input(s): LIPASE, AMYLASE in the last 168 hours. No results for input(s): AMMONIA in the last 168 hours. Coagulation Profile: Recent Labs  Lab 08/25/18 1607  INR 1.09   Cardiac Enzymes: No results for input(s): CKTOTAL, CKMB, CKMBINDEX, TROPONINI in the last 168 hours. BNP (last 3 results) No results for input(s): PROBNP in the last 8760 hours. HbA1C: No results for input(s): HGBA1C in the last 72 hours. CBG: No results for input(s): GLUCAP in the last 168 hours. Lipid Profile: No results for input(s): CHOL, HDL, LDLCALC, TRIG, CHOLHDL, LDLDIRECT in the last 72 hours. Thyroid Function Tests: No results for input(s): TSH, T4TOTAL, FREET4, T3FREE, THYROIDAB in the last 72 hours. Anemia Panel: No results for input(s): VITAMINB12, FOLATE, FERRITIN, TIBC, IRON, RETICCTPCT in the last 72 hours. Urine analysis:    Component Value Date/Time   COLORURINE YELLOW 08/12/2018 2025   APPEARANCEUR TURBID (A) 08/12/2018 2025   LABSPEC 1.010 08/12/2018 2025   PHURINE 8.0 08/12/2018 2025   GLUCOSEU 50 (A) 08/12/2018 2025   HGBUR MODERATE (A) 08/12/2018 2025   BILIRUBINUR NEGATIVE 08/12/2018 2025   KETONESUR NEGATIVE 08/12/2018 2025   PROTEINUR 100 (A) 08/12/2018 2025   UROBILINOGEN 0.2 04/10/2015 1800   NITRITE NEGATIVE 08/12/2018 2025   LEUKOCYTESUR LARGE (A) 08/12/2018 2025   Sepsis Labs: _1 (procalcitonin:4,lacticidven:4) )No results found for this or any previous visit (from the past 240 hour(s)).   Radiological Exams on Admission: Dg Chest 2 View  Result Date: 08/25/2018 CLINICAL DATA:  Altered mental status EXAM: CHEST - 2 VIEW COMPARISON:  Chest x-rays dated 08/12/2018 and 06/26/2017 FINDINGS: Stable cardiomegaly. Lungs are clear. No pleural effusion or pneumothorax seen. Osseous structures about the chest are unremarkable. IMPRESSION: 1. No active cardiopulmonary disease. No  evidence of pneumonia or pulmonary edema. 2. Stable cardiomegaly. Electronically Signed   By: Franki Cabot M.D.   On: 08/25/2018 17:23   Ct Head Wo Contrast  Result Date: 08/25/2018 CLINICAL DATA:  New onset weakness  after dialysis. Hypotension. Slurred speech. EXAM: CT HEAD WITHOUT CONTRAST TECHNIQUE: Contiguous axial images were obtained from the base of the skull through the vertex without intravenous contrast. COMPARISON:  CT head without contrast 07/26/2018 FINDINGS: Brain: Moderate atrophy and white matter is similar to the prior exam. No acute infarct, hemorrhage, or mass lesion is present. Diffuse hypoattenuation is present throughout the basal ganglia. A remote lacunar infarct is again seen in the left cerebellum. Ventricles are proportionate to the degree of atrophy. No significant extra-axial fluid collection present. Vascular: Atherosclerotic calcifications are present within the cavernous internal carotid arteries bilaterally. There is no hyperdense vessel. Skull: Multiple lucent lesions are again noted. There is no significant interval change. Findings are consistent with known multiple myeloma. There is a focal lesion involving the left arch of C1. Sinuses/Orbits: The paranasal sinuses and mastoid air cells are clear. Globes and orbits are within normal limits. IMPRESSION: 1. Stable appearance of atrophy and white matter disease. 2. No acute intracranial abnormality. 3. Multiple lucent lesions in the calvarium and left-sided arch of C1 consistent with known multiple myeloma. There is no significant progression. Electronically Signed   By: San Morelle M.D.   On: 08/25/2018 17:22    EKG: Independently reviewed.  Sinus bradycardia at 52 bpm.  Assessment/Plan Principal Problem:   Anemia associated with chronic renal failure Active Problems:   Multiple myeloma (HCC)   Hypertensive urgency   Hyponatremia   ESRD needing dialysis (HCC)   Pressure ulcer, stage I   Neurocognitive  deficits   Weakness   Anemia    1. Anemia with stool for occult blood positive -2 units of PRBC has been ordered.  Recheck metabolic panel.  Follow CBC.  May consult GI in the morning since stool for occult blood was positive. 2. Hyponatremia -sodium management per nephrology.  On dialysis. 3. ESRD on hemodialysis on Tuesday Thursday and Saturday.  Had dialysis yesterday. 4. Hypertensive urgency -we will continue home medications including clonidine patch Imdur and amlodipine and keep patient on PRN IV hydralazine.  Closely follow blood pressure trends. 5. Neurocognitive deficit with dysphagia -CT head was unremarkable.  Follow MRI. 6. Admission for bacteremia on IV meropenem to be covered until August 29, 2018. 7. History of multiple myeloma refractory being followed by [facility].   DVT prophylaxis: SCDs. Code Status: Full code. Family Communication: No family at the bedside. Disposition Plan: Back to facility. Consults called: None. Admission status: Observation.   Rise Patience MD [facility] Pager [phone].  If 7PM-7AM, please contact night-coverage [email or web address] Password [email or web address]  08/26/2018, 2:28 AM

## 2018-08-26 NOTE — ED Notes (Signed)
Pharmacist notified on pt.'s pending Catapres patch.

## 2018-08-26 NOTE — ED Notes (Signed)
1st unit PRBC completed with no adverse effect , IV site intact , afebrile / respirations unlabored.

## 2018-08-26 NOTE — ED Notes (Signed)
1st unit PRBC started at 120 ml/hr, respirations unlabored , afebrile , IV site intact .

## 2018-08-26 NOTE — Progress Notes (Signed)
Pharmacy Antibiotic Note  [name] is a 77 y.o. female admitted on 08/25/2018 with weakness.  Pharmacy has been consulted for Merrem dosing. Pt has been on Merrem at the nursing facility for bacteremia. ESRD on HD.   Plan: Merrem 500 mg IV q24h Trend WBC, temp F/U with MD to see if the stop date should remain the same   Temp (24hrs), Avg:97.5 F (36.4 C), Min:97.1 F (36.2 C), Max:97.8 F (36.6 C)  Recent Labs  Lab 08/19/18 0724 08/25/18 1607 08/25/18 1615 08/25/18 2301  WBC 5.2 3.9*  --  3.7*  CREATININE 8.13* 9.21*  --  9.48*  LATICACIDVEN  --   --  1.20  --     Estimated Creatinine Clearance: 4 mL/min (A) (by C-G formula based on SCr of 9.48 mg/dL (H)).    Allergies  Allergen Reactions  . Phenergan [Promethazine Hcl] Other (See Comments)    Acute encephalopathy in the context of refusal to go to hemodialysis and administration of Phenergan for nausea and vomiting    [name] 08/26/2018 4:13 AM

## 2018-08-26 NOTE — Progress Notes (Signed)
Patient admitted after midnight, please see H&P.  Here with multiple medical issues. Some acute and some chronic: Acute: anemia, hypothermia and AMS Chronic: ESRD, bacteremia Per chart review normal for patient is: withdrawn,suspicious and argumentative  Just d/c'd on 11/20 after treatment for bacteremia-- getting IV Merrem through 11/29.  Also on Dysphagia 1 diet, pured with nectar thick liquids  For hypothermia: check TSH, cortisol, MRI brain pending-- ordered [facility]-- patient may have to be restrained to use.    Eulogio Bear DO

## 2018-08-26 NOTE — Evaluation (Signed)
Clinical/Bedside Swallow Evaluation Patient Details  Name: [name] MRN: [id number] Date of Birth: 01/31/1941  Today's Date: 08/26/2018 Time: SLP Start Time (ACUTE ONLY): 1205 SLP Stop Time (ACUTE ONLY): 1230 SLP Time Calculation (min) (ACUTE ONLY): 25 min  Past Medical History:  Past Medical History:  Diagnosis Date  . Anxiety   . Closed fracture of right distal femur ([place]) 09/01/2015  . Depression   . ESRD (end stage renal disease) on dialysis [facility])    "TTS; [facility]" (04/24/2017)  . GERD (gastroesophageal reflux disease) 04/23/2015  . HCAP (healthcare-associated pneumonia) 04/24/2017  . Heart murmur   . History of blood transfusion    "low HgB when I was going to dialysis center"  . Hypertension   . Hypertension associated with end stage renal disease on dialysis 03/11/2014  . Malnutrition of moderate degree 09/02/2015  . Multiple myeloma ([place]) 03/11/2014  . Patient is Jehovah's Witness    "I'd rather never have any blood transfusions unless absolutely necessary; please check with me 1st". (04/24/2017)  . Pneumonia ~ 2016   Past Surgical History:  Past Surgical History:  Procedure Laterality Date  . AV FISTULA PLACEMENT Left   . I&D EXTREMITY Left 02/27/2016   Procedure: ARTHROSCOPIC IRRIGATION AND DEBRIDEMENT EXTREMITY;  Surgeon: Renette Butters, MD;  Location: [place];  Service: Orthopedics;  Laterality: Left;  . TEE WITHOUT CARDIOVERSION N/A 02/29/2016   Procedure: TRANSESOPHAGEAL ECHOCARDIOGRAM (TEE);  Surgeon: Thayer Headings, MD;  Location: [facility] ENDOSCOPY;  Service: Cardiovascular;  Laterality: N/A;   HPI:  [name] is a 77 y.o. female with PMH significant for ERSD on HD, HTN, pneumonia (2016), GERD, who presented to [facility] (from SNF) on 08/12/18 with fever, AMS, cellulitis. Pt also had recent admission for a fall (07/26/18). Has been seen by ST July 2019 with MBS (recc D3/thin - possible UES dysfunction) and August 2019 (rec reg/thin) and most 08/19/18 discharged with a  Dys 3, thin liquid diet. She was admitted last evening (08/25/18)with hypothermia, anemia and altered mental status.    Assessment / Plan / Recommendation Clinical Impression  Patient has a history of mild oropharyngeal dysphagia, discharged from hospital and speech therapy on 08/19/18 with a diet of Dys 3, thin liquids. She is currently on Dys 1, nectar thickened liquid diet since admission. Patient tolerated lunch tray consistencies with no s/s of aspiration. She expressed needing chopped meats because she can't chew (and is edematous with no dentures present). Patient exhibited abiltiy to orally manipulate Dys 1 consisties with no holding or pocketing. She tolerated nectar thickened and thin liquids well with no changes in vocal quality or respiratory status. Patient tolerated trials of cracker well with alternating solids with liquids. Recommend Dys 2, thin liquid diet with using a liquid wash to clear oral cavity after solids. Medications to be given whole in pureed.  Speech therapy to sign off as patient is at baseline with swallowing abilitites.  SLP Visit Diagnosis: Dysphagia, oral phase (R13.11)    Aspiration Risk  Mild aspiration risk    Diet Recommendation Dysphagia 2 (Fine chop);Thin liquid   Liquid Administration via: Cup Medication Administration: Whole meds with puree Supervision: Staff to assist with self feeding Compensations: Minimize environmental distractions;Follow solids with liquid Postural Changes: Seated upright at 90 degrees;Remain upright for at least 30 minutes after po intake    Other  Recommendations Oral Care Recommendations: Oral care BID   Follow up Recommendations Skilled Nursing facility      Frequency and Duration  Prognosis Prognosis for Safe Diet Advancement: Good      Swallow Study   General Date of Onset: 08/25/18 HPI: [name] is a 77 y.o. female with PMH significant for ERSD on HD, HTN, pneumonia (2016), GERD, who presented to  [facility] (from SNF) on 08/12/18 with fever, AMS, cellulitis. Pt also had recent admission for a fall (07/26/18). Has been seen by ST July 2019 with MBS (recc D3/thin - possible UES dysfunction) and August 2019 (rec reg/thin) and most 08/19/18 discharged with a Dys 3, thin liquid diet. She was admitted last evening (08/25/18)with hypothermia, anemia and altered mental status.  Type of Study: Bedside Swallow Evaluation Previous Swallow Assessment: BSE 04/17/18, MBS 04/18/18, BSE 05/29/18 Diet Prior to this Study: Dysphagia 1 (puree);Nectar-thick liquids Temperature Spikes Noted: No Respiratory Status: Nasal cannula History of Recent Intubation: No Behavior/Cognition: Agitated;Impulsive Oral Cavity Assessment: Within Functional Limits Oral Care Completed by SLP: No Oral Cavity - Dentition: Edentulous Vision: Functional for self-feeding Self-Feeding Abilities: Needs assist Patient Positioning: Upright in bed Baseline Vocal Quality: Normal Volitional Cough: Strong Volitional Swallow: Able to elicit    Oral/Motor/Sensory Function Overall Oral Motor/Sensory Function: Within functional limits   Ice Chips Ice chips: Within functional limits Presentation: Spoon   Thin Liquid Thin Liquid: Within functional limits Presentation: Cup    Nectar Thick Nectar Thick Liquid: Not tested   Honey Thick Honey Thick Liquid: Not tested   Puree Puree: Within functional limits Presentation: Spoon   Solid     Solid: Impaired Presentation: Spoon Oral Phase Impairments: Impaired mastication Oral Phase Functional Implications: Oral holding       J. , MA, CCC-SLP 08/26/2018 12:47 PM     

## 2018-08-26 NOTE — ED Notes (Signed)
1st unit PRBC infusing at 250 ml/hr with no adverse reaction , afebrile /respirations unlabored , IV site intact.

## 2018-08-26 NOTE — ED Notes (Signed)
2nd unit PRBC infusing at 120 ml/hr , afebrile , respirations unlabored / IV site intact .

## 2018-08-26 NOTE — Progress Notes (Signed)
Noted that patient recently admitted 11/12 - 08/20/18 and is from [place]. CSW confirmed with Helene Kelp that patient is a long term care resident. Left voicemail message for patient's daughter requesting a call back. CSW did go by patient's room, but patient sleeping soundly and unable to rouse; did note patient had been agitated today. Full assessment to follow after CSW able to reach family. CSW to follow for disposition planning.  Estanislado Emms, [place]

## 2018-08-26 NOTE — ED Notes (Signed)
2nd unit PRBC completed with no adverse effect , respirations unlabored , IV site intact .

## 2018-08-26 NOTE — Progress Notes (Signed)
Pt agitated, yelling at staff MD and NP at bedside new orders obtained

## 2018-08-26 NOTE — ED Notes (Signed)
2nd unit PRBC infusing at 250 ml/hr , no adverse effect , afebrile /respirations unlabored , IV site intact , pt. sleeping with no distress.

## 2018-08-26 NOTE — Progress Notes (Signed)
Pt agitated and swinging at staff.  Will not be able to dialyze today.  Have d/w primary MD, will need some antipsychotic and/or sedation most likely.  Will reassess for HD tomorrow in the morning.   Kelly Splinter MD [employer] 08/26/2018, 4:00 PM

## 2018-08-27 ENCOUNTER — Inpatient Hospital Stay (HOSPITAL_COMMUNITY): Payer: Medicare Other

## 2018-08-27 DIAGNOSIS — I361 Nonrheumatic tricuspid (valve) insufficiency: Secondary | ICD-10-CM

## 2018-08-27 LAB — TYPE AND SCREEN
ABO/RH(D): O POS
ANTIBODY SCREEN: NEGATIVE
Unit division: 0
Unit division: 0

## 2018-08-27 LAB — CBC
HCT: 29 % — ABNORMAL LOW (ref 36.0–46.0)
HEMATOCRIT: 27.4 % — AB (ref 36.0–46.0)
Hemoglobin: 9.2 g/dL — ABNORMAL LOW (ref 12.0–15.0)
Hemoglobin: 9 g/dL — ABNORMAL LOW (ref 12.0–15.0)
MCH: 29.4 pg (ref 26.0–34.0)
MCH: 30.4 pg (ref 26.0–34.0)
MCHC: 31.7 g/dL (ref 30.0–36.0)
MCHC: 32.8 g/dL (ref 30.0–36.0)
MCV: 92.7 fL (ref 80.0–100.0)
MCV: 92.6 fL (ref 80.0–100.0)
PLATELETS: 240 10*3/uL (ref 150–400)
Platelets: 230 10*3/uL (ref 150–400)
RBC: 3.13 MIL/uL — ABNORMAL LOW (ref 3.87–5.11)
RBC: 2.96 MIL/uL — ABNORMAL LOW (ref 3.87–5.11)
RDW: 17.7 % — AB (ref 11.5–15.5)
RDW: 17.4 % — AB (ref 11.5–15.5)
WBC: 4.2 10*3/uL (ref 4.0–10.5)
WBC: 3.6 10*3/uL — ABNORMAL LOW (ref 4.0–10.5)
nRBC: 0 % (ref 0.0–0.2)
nRBC: 0 % (ref 0.0–0.2)

## 2018-08-27 LAB — BPAM RBC
Blood Product Expiration Date: 201912212359
Blood Product Expiration Date: 201912212359
ISSUE DATE / TIME: 201911252357
ISSUE DATE / TIME: 201911260148
UNIT TYPE AND RH: 5100
UNIT TYPE AND RH: 5100

## 2018-08-27 LAB — BASIC METABOLIC PANEL
Anion gap: 12 (ref 5–15)
BUN: 54 mg/dL — AB (ref 8–23)
CHLORIDE: 84 mmol/L — AB (ref 98–111)
CO2: 22 mmol/L (ref 22–32)
CREATININE: 10.48 mg/dL — AB (ref 0.44–1.00)
Calcium: 8.4 mg/dL — ABNORMAL LOW (ref 8.9–10.3)
GFR calc Af Amer: 4 mL/min — ABNORMAL LOW (ref >60)
GFR calc non Af Amer: 3 mL/min — ABNORMAL LOW (ref >60)
Glucose, Bld: 81 mg/dL (ref 70–99)
Potassium: 5.1 mmol/L (ref 3.5–5.1)
SODIUM: 118 mmol/L — AB (ref 135–145)

## 2018-08-27 LAB — VITAMIN B12: Vitamin B-12: 696 pg/mL (ref 180–914)

## 2018-08-27 LAB — ECHOCARDIOGRAM COMPLETE
Height: 63 in
Weight: 2116.42 oz

## 2018-08-27 NOTE — Clinical Social Work Note (Signed)
Clinical Social Work Assessment  Patient Details  Name: [name] MRN: [id number] Date of Birth: 02/02/1941  Date of referral:  08/27/18               Reason for consult:  Facility Placement, Discharge Planning                Permission sought to share information with:  Facility Sport and exercise psychologist, Family Supports Permission granted to share information::  No  Name::     Charolotte Capuchin  Agency::  [facility]  Relationship::  brother  Contact Information:  [phone]  Housing/Transportation Living arrangements for the past 2 months:  [place] of Information:  Siblings Patient Interpreter Needed:  None Criminal Activity/Legal Involvement Pertinent to Current Situation/Hospitalization:  No - Comment as needed Significant Relationships:  Siblings, Adult Children Lives with:  Facility Resident Do you feel safe going back to the place where you live?  Yes Need for family participation in patient care:  Yes (Comment)  Care giving concerns: Patient is a long term resident at [facility] [place]. Recent re-admit.   Social Worker assessment / plan: CSW spoke to patient's brother, Bobby Rumpf, on the phone. Patient answers orientation questions correctly, but has been agitated during admission. Patient's brother reported that patient has lived at [facility] for about three years and receives dialysis in [place]. Brother indicated that the plan is for patient is to return to [place] at discharge.  CSW confirmed with [facility] admissions that patient is a long term care resident and they will accept her back when medically cleared.   CSW to follow for medical readiness and support with discharge planning.  Employment status:  Retired Forensic scientist:  Information systems manager, Medicaid [place] PT Recommendations:  Not assessed at this time Information / Referral to community resources:  [place]  Patient/Family's Response to care: Brother appreciative  of care.  Patient/Family's Understanding of and Emotional Response to Diagnosis, Current Treatment, and Prognosis: Brother with good understanding of patient's conditions and has been in touch with staff regarding her treatment. Brother agreeable for patient to return to SNF.  Emotional Assessment Appearance:  Appears stated age Attitude/Demeanor/Rapport:  Unable to Assess Affect (typically observed):  Unable to Assess Orientation:  Fluctuating Orientation (Suspected and/or reported Sundowners) Alcohol / Substance use:  Not Applicable Psych involvement (Current and /or in the community):  No (Comment)  Discharge Needs  Concerns to be addressed:  Discharge Planning Concerns, Care Coordination Readmission within the last 30 days:  Yes Current discharge risk:  Physical Impairment, Chronically ill Barriers to Discharge:  Continued Medical Work up   Estanislado Emms, LCSW 08/27/2018, 3:01 PM

## 2018-08-27 NOTE — Progress Notes (Signed)
Obtained phone consent with Ailene Rud RN from patient's brother [name]. [name] gave verbal consent for patient's temporary HD catheter placement today. [name] also gave consent for patient's fistulagram with possible thrombolysis on 11/29. Consent forms placed in patient's chart.

## 2018-08-27 NOTE — Progress Notes (Signed)
VAST RN to pt's bedside for IV placement for antibiotics. Pt is a dialysis patient and left arm restricted. Attempted PIV placement X2 unsuccessfully. Pt is due to go to dialysis within next 30 mins. VAST RN contacted pharmacy to inquire if abx could be administered IM. After looking up information, pharmacist stated Meropenum could not be administered IM. Spoke to unit RN, Colletta Maryland and asked that she place IVT consult for IV placement once pt returns from dialysis.

## 2018-08-27 NOTE — Progress Notes (Signed)
Upon cannulation x2 sticks minimal flashback, needles removed x2 noted with blood clots at needle tips x2 this nurse. Charge Nurse Mingo attempts x2 sticks with same results. Dr. Jonnie Finner aware orders to return pt. To room.pt. Alert no distress noted. Pt. Transported back to room via transport and 1: sitter

## 2018-08-27 NOTE — Progress Notes (Addendum)
PROGRESS NOTE    [name]  WSF:[id number] DOB: 10-24-1940 DOA: 08/25/2018 PCP: Hendricks Limes, MD  Brief Narrative: [name] is a 77 year old female with history of ESRD on hemodialysis, mild dementia with cognitive deficits and dysphagia, multiple myeloma, chronic anemia recently hospitalized with sepsis, gram-negative bacteremia with Achromobacter species, she was treated with IV meropenem, source was not found, discharged to skilled nursing facility to complete a 2-week course of IV meropenem on 11/29. -11/26 with weakness, mild hypotension, hemoglobin of 7 and a sodium of 121 -[name] is a very poor historian unable to provide any corroborative history, she was given 2 units of PRBCs last night on admission, was found to be heme positive   Assessment & Plan:   Gram-negative bacteremia -Continue IV meropenem -Just completing 2-week course of IV meropenem from last hospital stay on 11/29 for Achromobacter bacteremia -Source of bacteremia is unclear, AV fistula site appears unremarkable -check 2d ECHO -will request ID input  Anemia -Is multifactorial, secondary to chronic kidney disease, multiple myeloma, chemotherapy and suspect low-grade GI bleed as well -Monitor hemoglobin -If active bleeding ensues will proceed to inpatient work-up otherwise recommend outpatient gastroenterology evaluation -Transfused 2 units of PRBC overnight 11/27  ESRD on hemodialysis -Nephrology following, last dialysis was yesterday  Mild dementia with cognitive dysfunction -CT head unremarkable  Recent Achromobacter bacteremia -On meropenem as above  History of multiple myeloma -Refractory, followed at [facility] -Had been on carfilzomib and dexamethasone per oncology -Valacyclovir for prophylaxis  DVT prophylaxis: SCds Code Status: Full Code Family Communication: no family at bedside Disposition Plan: SNF when workup complete Consultants:   Nephrology   Procedures:   Antimicrobials:     Subjective: -Tired but okay, denies any overt ongoing bleeding  Objective: Vitals:   08/27/18 0441 08/27/18 0630 08/27/18 0806 08/27/18 1138  BP:   (!) 171/92 (!) 146/90  Pulse: 63  60 (!) 50  Resp:   16 18  Temp:   98.6 F (37 C) 98.3 F (36.8 C)  TempSrc:   Oral Oral  SpO2: 100%  99% 99%  Weight:  60 kg    Height:  _0  (1.6 m)      Intake/Output Summary (Last 24 hours) at 08/27/2018 1355 Last data filed at 08/27/2018 0174 Gross per 24 hour  Intake 2726 ml  Output 100 ml  Net 2626 ml   Filed Weights   08/27/18 0630  Weight: 60 kg    Examination:  General exam: Elderly, chronically ill-appearing female, sitting up in bed, no distress, cognitive deficits noted, alert awake oriented to self and place only  respiratory system: Decreased breath sounds at both bases Cardiovascular system: S1-S2/regular rate rhythm,  gastrointestinal system: Abdomen is nondistended, soft and nontender.Normal bowel sounds heard. Central nervous system: Moves all extremities, no localizing signs Extremities: Left arm AV fistula site appears unremarkable Skin: No rashes, lesions or ulcers Psychiatry: ]. Mood & affect appropriate.     Data Reviewed:   CBC: Recent Labs  Lab 08/25/18 1607 08/25/18 2301 08/26/18 0558 08/27/18 1254  WBC 3.9* 3.7* 4.0 4.2  NEUTROABS 2.9 2.6  --   --   HGB 7.1* 7.1* 11.6* 9.2*  HCT 23.6* 23.3* 36.3 29.0*  MCV 100.4* 99.6 93.1 92.7  PLT 234 228 263 944   Basic Metabolic Panel: Recent Labs  Lab 08/25/18 2301 08/26/18 0558 08/26/18 1108 08/26/18 2256 08/27/18 1057  NA 122* 124* 122* 120* 118*  K 4.4 4.1 4.5 4.9 5.1  CL 87* 89* 89* 86* 84*  CO2 23 21* 20* 22 22  GLUCOSE 85 84 96 81 81  BUN 49* 49* 51* 49* 54*  CREATININE 9.48* 9.59* 9.88* 9.93* 10.48*  CALCIUM 8.3* 8.5* 8.2* 8.5* 8.4*   GFR: Estimated Creatinine Clearance: 3.6 mL/min (A) (by C-G formula based on SCr of 10.48 mg/dL (H)). Liver Function Tests: Recent Labs  Lab  08/25/18 1607  AST 16  ALT 8  ALKPHOS 44  BILITOT 0.8  PROT 8.2*  ALBUMIN 2.7*   No results for input(s): LIPASE, AMYLASE in the last 168 hours. No results for input(s): AMMONIA in the last 168 hours. Coagulation Profile: Recent Labs  Lab 08/25/18 1607  INR 1.09   Cardiac Enzymes: No results for input(s): CKTOTAL, CKMB, CKMBINDEX, TROPONINI in the last 168 hours. BNP (last 3 results) No results for input(s): PROBNP in the last 8760 hours. HbA1C: No results for input(s): HGBA1C in the last 72 hours. CBG: No results for input(s): GLUCAP in the last 168 hours. Lipid Profile: No results for input(s): CHOL, HDL, LDLCALC, TRIG, CHOLHDL, LDLDIRECT in the last 72 hours. Thyroid Function Tests: Recent Labs    08/26/18 1051  TSH 2.777   Anemia Panel: No results for input(s): VITAMINB12, FOLATE, FERRITIN, TIBC, IRON, RETICCTPCT in the last 72 hours. Urine analysis:    Component Value Date/Time   COLORURINE YELLOW 08/12/2018 2025   APPEARANCEUR TURBID (A) 08/12/2018 2025   LABSPEC 1.010 08/12/2018 2025   PHURINE 8.0 08/12/2018 2025   GLUCOSEU 50 (A) 08/12/2018 2025   HGBUR MODERATE (A) 08/12/2018 2025   BILIRUBINUR NEGATIVE 08/12/2018 2025   KETONESUR NEGATIVE 08/12/2018 2025   PROTEINUR 100 (A) 08/12/2018 2025   UROBILINOGEN 0.2 04/10/2015 1800   NITRITE NEGATIVE 08/12/2018 2025   LEUKOCYTESUR LARGE (A) 08/12/2018 2025   Sepsis Labs: _0 (procalcitonin:4,lacticidven:4)  ) Recent Results (from the past 240 hour(s))  Culture, blood (routine x 2)     Status: None (Preliminary result)   Collection Time: 08/25/18  4:04 PM  Result Value Ref Range Status   Specimen Description BLOOD RIGHT FOREARM  Final   Special Requests   Final    BOTTLES DRAWN AEROBIC ONLY Blood Culture adequate volume   Culture  Setup Time   Final    AEROBIC BOTTLE ONLY GRAM NEGATIVE RODS CRITICAL VALUE NOTED.  VALUE IS CONSISTENT WITH PREVIOUSLY REPORTED AND CALLED VALUE. Performed at [place] [facility], [place] [address], [place], [place] [address]    Culture GRAM NEGATIVE RODS  Final   Report Status PENDING  Incomplete  Culture, blood (routine x 2)     Status: None (Preliminary result)   Collection Time: 08/25/18  4:09 PM  Result Value Ref Range Status   Specimen Description BLOOD RIGHT ANTECUBITAL  Final   Special Requests   Final    BOTTLES DRAWN AEROBIC AND ANAEROBIC Blood Culture adequate volume   Culture  Setup Time   Final    AEROBIC BOTTLE ONLY GRAM NEGATIVE RODS CRITICAL RESULT CALLED TO, READ BACK BY AND VERIFIED WITH: Diona Browner PHARMD 08/26/18 1825 JDW Performed at [facility], [place] [address] [place], [place] [address]    Culture GRAM NEGATIVE RODS  Final   Report Status PENDING  Incomplete         Radiology Studies: Dg Chest 2 View  Result Date: 08/25/2018 CLINICAL DATA:  Altered mental status EXAM: CHEST - 2 VIEW COMPARISON:  Chest x-rays dated 08/12/2018 and 06/26/2017 FINDINGS: Stable cardiomegaly. Lungs are clear. No pleural effusion or pneumothorax seen.  Osseous structures about the chest are unremarkable. IMPRESSION: 1. No active cardiopulmonary disease. No evidence of pneumonia or pulmonary edema. 2. Stable cardiomegaly. Electronically Signed   By: Franki Cabot M.D.   On: 08/25/2018 17:23   Ct Head Wo Contrast  Result Date: 08/25/2018 CLINICAL DATA:  New onset weakness after dialysis. Hypotension. Slurred speech. EXAM: CT HEAD WITHOUT CONTRAST TECHNIQUE: Contiguous axial images were obtained from the base of the skull through the vertex without intravenous contrast. COMPARISON:  CT head without contrast 07/26/2018 FINDINGS: Brain: Moderate atrophy and white matter is similar to the prior exam. No acute infarct, hemorrhage, or mass lesion is present. Diffuse hypoattenuation is present throughout the basal ganglia. A remote lacunar infarct is again seen in the left cerebellum. Ventricles are proportionate to the degree of atrophy. No significant  extra-axial fluid collection present. Vascular: Atherosclerotic calcifications are present within the cavernous internal carotid arteries bilaterally. There is no hyperdense vessel. Skull: Multiple lucent lesions are again noted. There is no significant interval change. Findings are consistent with known multiple myeloma. There is a focal lesion involving the left arch of C1. Sinuses/Orbits: The paranasal sinuses and mastoid air cells are clear. Globes and orbits are within normal limits. IMPRESSION: 1. Stable appearance of atrophy and white matter disease. 2. No acute intracranial abnormality. 3. Multiple lucent lesions in the calvarium and left-sided arch of C1 consistent with known multiple myeloma. There is no significant progression. Electronically Signed   By: San Morelle M.D.   On: 08/25/2018 17:22        Scheduled Meds: . amLODipine  10 mg Oral QPM  . calcium acetate  1,334 mg Oral TID WC  . Chlorhexidine Gluconate Cloth  6 each Topical Q0600  . Chlorhexidine Gluconate Cloth  6 each Topical Q0600  . cloNIDine  0.2 mg Transdermal Q Wed  . famotidine  20 mg Oral BID  . isosorbide mononitrate  60 mg Oral Daily  . lamoTRIgine  50 mg Oral Daily  . latanoprost  1 drop Both Eyes QHS  . LORazepam  0.5 mg Oral QHS  . losartan  50 mg Oral QPM  . multivitamin  1 tablet Oral QHS  . sevelamer carbonate  1,600 mg Oral TID WC  . simethicone  120 mg Oral TID   Continuous Infusions: . meropenem (MERREM) IV 500 mg (08/27/18 1313)     LOS: 1 day    Time spent: 73mn    PDomenic Polite MD [facility] Page via [email or web address], password [email or web address] After 7PM please contact night-coverage  08/27/2018, 1:55 PM

## 2018-08-27 NOTE — Consult Note (Signed)
[place] [facility]  Total days of antibiotics 13               Reason for Consult: GNR bacteremia   Referring Physician: Broadus John  Principal Problem:   Anemia associated with chronic renal failure Active Problems:   Multiple myeloma (HCC)   Hypertensive urgency   Hyponatremia   ESRD needing dialysis (HCC)   Pressure ulcer, stage I   Neurocognitive deficits   Weakness   Anemia   Hypothermia    HPI: [name] is a 77 y.o. female  end-stage renal disease on dialysis and multiple myeloma currently on Carfilzomib, dexamethasone/valacyclovir for her myeloma which is treated at [facility]. She was recently hospitalized < 14d for fever and confusion on 11/12 and found to have achromabacter bacteremia in 2/4 bottles but source was somewhat unknown. Her vascular fistula were well functioning. Her fever curve and WBC normalized at 48hrs of her admission. She was discharged on 2 weeks of meropenem. She was readmitted on 11/25 where she complained of weakness at hemodialysis and found that she was anemic, where she initially refused blood transfusion due to her religious belieft but then returned later in the day consenting for blood transfusion and admission. She recevied 2 units of rbc, for labs that showed hemoglobin of 7 sodium 121.  On my exam patient has difficulty expressing but is oriented to place and follows commands but generally weak. She did not have any leukocytosis (like she had previously) nor any fever - her blood cx were repeated and found to have GNR. She was currently on day 11 of her abtx course. ID asked to help with GNR bacteremia management. She was also found to be midly hypotensive in HD and complained of bilateral arm pain.vascular surgery involved to see if need bilateral fistulogram or see if any signs of infection involving fistula.she Has been able to tolerated hd.  Past Medical History:  Diagnosis Date  . Anxiety   . Closed fracture of right distal femur  ([place]) 09/01/2015  . Depression   . ESRD (end stage renal disease) on dialysis [facility])    "TTS; [place]" (04/24/2017)  . GERD (gastroesophageal reflux disease) 04/23/2015  . HCAP (healthcare-associated pneumonia) 04/24/2017  . Heart murmur   . History of blood transfusion    "low HgB when I was going to dialysis center"  . Hypertension   . Hypertension associated with end stage renal disease on dialysis 03/11/2014  . Malnutrition of moderate degree 09/02/2015  . Multiple myeloma ([place]) 03/11/2014  . Patient is Jehovah's Witness    "I'd rather never have any blood transfusions unless absolutely necessary; please check with me 1st". (04/24/2017)  . Pneumonia ~ 2016    Allergies:  Allergies  Allergen Reactions  . Phenergan [Promethazine Hcl] Other (See Comments)    Acute encephalopathy in the context of refusal to go to hemodialysis and administration of Phenergan for nausea and vomiting    MEDICATIONS: . amLODipine  10 mg Oral QPM  . calcium acetate  1,334 mg Oral TID WC  . Chlorhexidine Gluconate Cloth  6 each Topical Q0600  . Chlorhexidine Gluconate Cloth  6 each Topical Q0600  . cloNIDine  0.2 mg Transdermal Q Wed  . famotidine  20 mg Oral BID  . isosorbide mononitrate  60 mg Oral Daily  . lamoTRIgine  50 mg Oral Daily  . latanoprost  1 drop Both Eyes QHS  . LORazepam  0.5 mg Oral QHS  . losartan  50 mg Oral QPM  . multivitamin  1 tablet Oral QHS  . sevelamer carbonate  1,600 mg Oral TID WC  . simethicone  120 mg Oral TID    Social History   Tobacco Use  . Smoking status: Never Smoker  . Smokeless tobacco: Never Used  Substance Use Topics  . Alcohol use: No    Alcohol/week: 0.0 standard drinks  . Drug use: No    Family History  Problem Relation Age of Onset  . Hypertension Mother   . Hypertension Father      Review of Systems  Constitutional: Negative for fever, chills, diaphoresis, activity change, appetite change, fatigue and unexpected weight change.  HENT:  Negative for congestion, sore throat, rhinorrhea, sneezing, trouble swallowing and sinus pressure.  Eyes: Negative for photophobia and visual disturbance.  Respiratory: Negative for cough, chest tightness, shortness of breath, wheezing and stridor.  Cardiovascular: Negative for chest pain, palpitations and leg swelling.  Gastrointestinal: Negative for nausea, vomiting, abdominal pain, diarrhea, constipation, blood in stool, abdominal distention and anal bleeding.  Genitourinary: Negative for dysuria, hematuria, flank pain and difficulty urinating.  Musculoskeletal: Negative for myalgias, back pain, joint swelling, arthralgias and gait problem.  Skin: Negative for color change, pallor, rash and wound.  Neurological: Negative for dizziness, tremors, weakness and light-headedness.  Hematological: Negative for adenopathy. Does not bruise/bleed easily.  Psychiatric/Behavioral: Negative for behavioral problems, confusion, sleep disturbance, dysphoric mood, decreased concentration and agitation.     OBJECTIVE: Temp:  [97.4 F (36.3 C)-98.6 F (37 C)] 98.3 F (36.8 C) (11/27 1138) Pulse Rate:  [43-65] 50 (11/27 1138) Resp:  [11-23] 18 (11/27 1138) BP: (125-173)/(76-101) 146/90 (11/27 1138) SpO2:  [98 %-100 %] 99 % (11/27 1138) Weight:  [60 kg] 60 kg (11/27 0630) Physical Exam  Constitutional:  oriented to person, place, and time. appears well-developed and well-nourished. No distress.  HENT: [place]/AT, PERRLA, no scleral icterus Mouth/Throat: Oropharynx is clear and moist. No oropharyngeal exudate.  Cardiovascular: Normal rate, regular rhythm and normal heart sounds. Exam reveals no gallop and no friction rub.  No murmur heard.  Pulmonary/Chest: Effort normal and breath sounds normal. No respiratory distress.  has no wheezes.  Neck = supple, no nuchal rigidity Abdominal: Soft. Bowel sounds are normal.  exhibits no distension. There is no tenderness.  Lymphadenopathy: no cervical adenopathy. No  axillary adenopathy Neurological: alert and oriented to person, place, and time.  Skin: Skin is warm and dry. No rash noted. No erythema.  Psychiatric: a normal mood and affect.  behavior is normal.    LABS: Results for orders placed or performed during the hospital encounter of 08/25/18 (from the past 48 hour(s))  CBC with Differential     Status: Abnormal   Collection Time: 08/25/18 11:01 PM  Result Value Ref Range   WBC 3.7 (L) 4.0 - 10.5 K/uL   RBC 2.34 (L) 3.87 - 5.11 MIL/uL   Hemoglobin 7.1 (L) 12.0 - 15.0 g/dL   HCT 23.3 (L) 36.0 - 46.0 %   MCV 99.6 80.0 - 100.0 fL   MCH 30.3 26.0 - 34.0 pg   MCHC 30.5 30.0 - 36.0 g/dL   RDW 16.2 (H) 11.5 - 15.5 %   Platelets 228 150 - 400 K/uL   nRBC 0.0 0.0 - 0.2 %   Neutrophils Relative % 72 %   Neutro Abs 2.6 1.7 - 7.7 K/uL   Lymphocytes Relative 16 %   Lymphs Abs 0.6 (L) 0.7 - 4.0 K/uL   Monocytes Relative 10 %  Monocytes Absolute 0.4 0.1 - 1.0 K/uL   Eosinophils Relative 1 %   Eosinophils Absolute 0.1 0.0 - 0.5 K/uL   Basophils Relative 0 %   Basophils Absolute 0.0 0.0 - 0.1 K/uL   Immature Granulocytes 1 %   Abs Immature Granulocytes 0.02 0.00 - 0.07 K/uL    Comment: Performed at [facility], [place] [address], [place], [place] [address]  Basic metabolic panel     Status: Abnormal   Collection Time: 08/25/18 11:01 PM  Result Value Ref Range   Sodium 122 (L) 135 - 145 mmol/L   Potassium 4.4 3.5 - 5.1 mmol/L   Chloride 87 (L) 98 - 111 mmol/L   CO2 23 22 - 32 mmol/L   Glucose, Bld 85 70 - 99 mg/dL   BUN 49 (H) 8 - 23 mg/dL   Creatinine, Ser 9.48 (H) 0.44 - 1.00 mg/dL   Calcium 8.3 (L) 8.9 - 10.3 mg/dL   GFR calc non Af Amer 3 (L) >60 mL/min   GFR calc Af Amer 4 (L) >60 mL/min    Comment: (NOTE) The eGFR has been calculated using the CKD EPI equation. This calculation has not been validated in all clinical situations. eGFR's persistently <60 mL/min signify possible Chronic Kidney Disease.    Anion gap 12 5 - 15     Comment: Performed at [facility] [address], [place], [place] [address]  Type and screen [place]     Status: None   Collection Time: 08/25/18 11:05 PM  Result Value Ref Range   ABO/RH(D) O POS    Antibody Screen NEG    Sample Expiration 08/28/2018    Unit Number [id number]    Blood Component Type RBC LR PHER2    Unit division 00    Status of Unit ISSUED,FINAL    Transfusion Status OK TO TRANSFUSE    Crossmatch Result      Compatible Performed at [facility], [place] [address], [place], [place] [address]    Unit Number [id number]    Blood Component Type RED CELLS,LR    Unit division 00    Status of Unit ISSUED,FINAL    Transfusion Status OK TO TRANSFUSE    Crossmatch Result Compatible   Prepare RBC     Status: None   Collection Time: 08/25/18 11:05 PM  Result Value Ref Range   Order Confirmation      ORDER PROCESSED BY BLOOD BANK Performed at [facility], [place] [address] [place],  [address]   Basic metabolic panel     Status: Abnormal   Collection Time: 08/26/18  5:58 AM  Result Value Ref Range   Sodium 124 (L) 135 - 145 mmol/L   Potassium 4.1 3.5 - 5.1 mmol/L   Chloride 89 (L) 98 - 111 mmol/L   CO2 21 (L) 22 - 32 mmol/L   Glucose, Bld 84 70 - 99 mg/dL   BUN 49 (H) 8 - 23 mg/dL   Creatinine, Ser 9.59 (H) 0.44 - 1.00 mg/dL   Calcium 8.5 (L) 8.9 - 10.3 mg/dL   GFR calc non Af Amer 3 (L) >60 mL/min   GFR calc Af Amer 4 (L) >60 mL/min    Comment: (NOTE) The eGFR has been calculated using the CKD EPI equation. This calculation has not been validated in all clinical situations. eGFR's persistently <60 mL/min signify possible Chronic Kidney Disease.    Anion gap 14 5 - 15  Comment: Performed at [facility], [place] [address] [place], [place] [address]  CBC     Status: Abnormal   Collection Time: 08/26/18  5:58 AM  Result Value Ref Range   WBC 4.0 4.0 - 10.5 K/uL   RBC 3.90 3.87 - 5.11 MIL/uL   Hemoglobin  11.6 (L) 12.0 - 15.0 g/dL    Comment: REPEATED TO VERIFY POST TRANSFUSION SPECIMEN    HCT 36.3 36.0 - 46.0 %   MCV 93.1 80.0 - 100.0 fL   MCH 29.7 26.0 - 34.0 pg   MCHC 32.0 30.0 - 36.0 g/dL   RDW 17.5 (H) 11.5 - 15.5 %   Platelets 263 150 - 400 K/uL   nRBC 0.0 0.0 - 0.2 %    Comment: Performed at [facility], [place] [address], [place], [place] [address]  TSH     Status: None   Collection Time: 08/26/18 10:51 AM  Result Value Ref Range   TSH 2.777 0.350 - 4.500 uIU/mL    Comment: Performed by a 3rd Generation assay with a functional sensitivity of <=0.01 uIU/mL. Performed at [facility], [place] [address] [place], [place] [address]   Cortisol     Status: None   Collection Time: 08/26/18 11:08 AM  Result Value Ref Range   Cortisol, Plasma 12.3 ug/dL    Comment: (NOTE) AM    6.7 - 22.6 ug/dL PM   <10.0       ug/dL Performed at [facility] [address] [place], [place] [address]   Basic metabolic panel     Status: Abnormal   Collection Time: 08/26/18 11:08 AM  Result Value Ref Range   Sodium 122 (L) 135 - 145 mmol/L   Potassium 4.5 3.5 - 5.1 mmol/L   Chloride 89 (L) 98 - 111 mmol/L   CO2 20 (L) 22 - 32 mmol/L   Glucose, Bld 96 70 - 99 mg/dL   BUN 51 (H) 8 - 23 mg/dL   Creatinine, Ser 9.88 (H) 0.44 - 1.00 mg/dL   Calcium 8.2 (L) 8.9 - 10.3 mg/dL   GFR calc non Af Amer 3 (L) >60 mL/min   GFR calc Af Amer 4 (L) >60 mL/min   Anion gap 13 5 - 15    Comment: Performed at [facility] [address], [place], [place] [address]  Basic metabolic panel     Status: Abnormal   Collection Time: 08/26/18 10:56 PM  Result Value Ref Range   Sodium 120 (L) 135 - 145 mmol/L   Potassium 4.9 3.5 - 5.1 mmol/L   Chloride 86 (L) 98 - 111 mmol/L   CO2 22 22 - 32 mmol/L   Glucose, Bld 81 70 - 99 mg/dL   BUN 49 (H) 8 - 23 mg/dL   Creatinine, Ser 9.93 (H) 0.44 - 1.00 mg/dL   Calcium 8.5 (L) 8.9 - 10.3 mg/dL   GFR calc non Af Amer 3 (L) >60 mL/min   GFR calc Af  Amer 4 (L) >60 mL/min   Anion gap 12 5 - 15    Comment: Performed at [facility] [address], [place], [place] [address]  Basic metabolic panel     Status: Abnormal   Collection Time: 08/27/18 10:57 AM  Result Value Ref Range   Sodium 118 (LL) 135 - 145 mmol/L    Comment: CRITICAL RESULT CALLED TO, READ BACK BY AND VERIFIED WITH: STEPHANIE BARNHILL,RN AT 1339 08/27/18 BY  ZBEECH.    Potassium 5.1 3.5 - 5.1 mmol/L   Chloride 84 (L) 98 - 111 mmol/L   CO2 22 22 - 32 mmol/L   Glucose, Bld 81 70 - 99 mg/dL   BUN 54 (H) 8 - 23 mg/dL   Creatinine, Ser 10.48 (H) 0.44 - 1.00 mg/dL   Calcium 8.4 (L) 8.9 - 10.3 mg/dL   GFR calc non Af Amer 3 (L) >60 mL/min   GFR calc Af Amer 4 (L) >60 mL/min   Anion gap 12 5 - 15    Comment: Performed at [facility] [address], [place], [place] [address]  CBC     Status: Abnormal   Collection Time: 08/27/18 12:54 PM  Result Value Ref Range   WBC 4.2 4.0 - 10.5 K/uL   RBC 3.13 (L) 3.87 - 5.11 MIL/uL   Hemoglobin 9.2 (L) 12.0 - 15.0 g/dL   HCT 29.0 (L) 36.0 - 46.0 %   MCV 92.7 80.0 - 100.0 fL   MCH 29.4 26.0 - 34.0 pg   MCHC 31.7 30.0 - 36.0 g/dL   RDW 17.7 (H) 11.5 - 15.5 %   Platelets 240 150 - 400 K/uL   nRBC 0.0 0.0 - 0.2 %    Comment: Performed at [facility], [place] [address] [place], [place] [address]    MICRO:  IMAGING: Dg Chest 2 View  Result Date: 08/25/2018 CLINICAL DATA:  Altered mental status EXAM: CHEST - 2 VIEW COMPARISON:  Chest x-rays dated 08/12/2018 and 06/26/2017 FINDINGS: Stable cardiomegaly. Lungs are clear. No pleural effusion or pneumothorax seen. Osseous structures about the chest are unremarkable. IMPRESSION: 1. No active cardiopulmonary disease. No evidence of pneumonia or pulmonary edema. 2. Stable cardiomegaly. Electronically Signed   By: Franki Cabot M.D.   On: 08/25/2018 17:23   Ct Head Wo Contrast  Result Date: 08/25/2018 CLINICAL DATA:  New onset weakness after dialysis. Hypotension.  Slurred speech. EXAM: CT HEAD WITHOUT CONTRAST TECHNIQUE: Contiguous axial images were obtained from the base of the skull through the vertex without intravenous contrast. COMPARISON:  CT head without contrast 07/26/2018 FINDINGS: Brain: Moderate atrophy and white matter is similar to the prior exam. No acute infarct, hemorrhage, or mass lesion is present. Diffuse hypoattenuation is present throughout the basal ganglia. A remote lacunar infarct is again seen in the left cerebellum. Ventricles are proportionate to the degree of atrophy. No significant extra-axial fluid collection present. Vascular: Atherosclerotic calcifications are present within the cavernous internal carotid arteries bilaterally. There is no hyperdense vessel. Skull: Multiple lucent lesions are again noted. There is no significant interval change. Findings are consistent with known multiple myeloma. There is a focal lesion involving the left arch of C1. Sinuses/Orbits: The paranasal sinuses and mastoid air cells are clear. Globes and orbits are within normal limits. IMPRESSION: 1. Stable appearance of atrophy and white matter disease. 2. No acute intracranial abnormality. 3. Multiple lucent lesions in the calvarium and left-sided arch of C1 consistent with known multiple myeloma. There is no significant progression. Electronically Signed   By: San Morelle M.D.   On: 08/25/2018 17:22    HISTORICAL MICRO/IMAGING  Assessment/Plan:  77yo F with MM, ESRD admitted for weakness, anemia with hgb of 7 (prior to discharge it was 8.4)   - need to find source of blood loss - would continue on meropenem for now to finish treatment course of achromobacter bacteremia - await identification of GNR from blood cx on 11/25 -  -  recommend to get TTE for now   Dr Tommy Medal to provide further recs

## 2018-08-27 NOTE — Progress Notes (Signed)
[facility] Progress Note  Subjective: pt is more cooperative today per RN, now is taking meds and interacting w/ staff better.  Unfortunately her AVF appears not to be working, pulled clots w/ 5 attempts at HD.  No bruit.   Vitals:   08/26/18 2351 08/27/18 0441 08/27/18 0630 08/27/18 0806  BP: 125/76   (!) 171/92  Pulse: 65 63  60  Resp:    16  Temp:    98.6 F (37 C)  TempSrc:    Oral  SpO2:  100%  99%  Weight:   60 kg   Height:   5\' 3"  (1.6 m)     Inpatient medications: . amLODipine  10 mg Oral QPM  . calcium acetate  1,334 mg Oral TID WC  . Chlorhexidine Gluconate Cloth  6 each Topical Q0600  . Chlorhexidine Gluconate Cloth  6 each Topical Q0600  . cloNIDine  0.2 mg Transdermal Q Wed  . famotidine  20 mg Oral BID  . isosorbide mononitrate  60 mg Oral Daily  . lamoTRIgine  50 mg Oral Daily  . latanoprost  1 drop Both Eyes QHS  . LORazepam  0.5 mg Oral QHS  . losartan  50 mg Oral QPM  . multivitamin  1 tablet Oral QHS  . sevelamer carbonate  1,600 mg Oral TID WC  . simethicone  120 mg Oral TID   . meropenem (MERREM) IV Stopped (08/26/18 0715)   acetaminophen **OR** acetaminophen, bisacodyl, hydrALAZINE, ondansetron **OR** ondansetron (ZOFRAN) IV, RESOURCE THICKENUP CLEAR, saccharomyces boulardii, senna-docusate  Iron/TIBC/Ferritin/ %Sat    Component Value Date/Time   IRON 149 (H) 03/13/2014 0442   TIBC NOT CALC 03/13/2014 0442   FERRITIN 2,786 (H) 03/13/2014 0442   IRONPCTSAT NOT CALC 03/13/2014 0442    Exam: Gen looks better, interacting No jvd or bruits Chest clear bilat to bases RRR no MRG Abd soft ntnd no mass or ascites +bs Ext 1+ bilat LE edema Neuro is alert, Ox 2, nonfocal LUA AVF no bruit    Home meds:  - clonidine patch 0.2 qwk/ clonidine 0.1 tid prn/ isosorbide mononitrate 60 qd/ amlodipine 10  - sevelamer carbonate 1.6gm ac tid/ calcium acetate 1334 ac  - lamotrigine 50 qd  - ranitidine 300 hs/ prns/ vitamins/  eyedrops  Dialysis: TTS   3.5h   425/A1.5  58kg  2/2.25  Hep none   LUA AVF - hectorol 3 ug  - epo alfa 18000 tiw IV  - goes to all HD sessions but signs off early frequently    CXR 11/25 > clear, no CHF     Impression/ Plan: 1. Gen weakness- prob multifactorial, r/o sepsis,hyponatremia, signing off early from HD.  Needs HD but AVF clotted.  2. AMS - recurrent issue, worsening per family.  Some psychotic features yesterday w/ agitation and paranoia. Better today. Needs dialysis and Na+ correction.  3. ESRD - HD TTS. The L arm AVF appears to be clotted, will consult IR for assistance. Pt's daughter and/or brother are available for consent if needed, numbers on chart.  4. Volume - suspect vol ^ w/ low Na+ levels 5. MBD ckd - cont meds 6. Anemia ckd - cont esa prn, Hb 11 7. HTN - cont meds, get vol down    Kelly Splinter MD [facility] pager [phone]   08/27/2018, 11:37 AM   Recent Labs  Lab 08/25/18 1607  08/26/18 1108 08/26/18 2256  NA 123*   < > 122* 120*  K 4.3   < >  4.5 4.9  CL 86*   < > 89* 86*  CO2 23   < > 20* 22  GLUCOSE 104*   < > 96 81  BUN 45*   < > 51* 49*  CREATININE 9.21*   < > 9.88* 9.93*  CALCIUM 8.5*   < > 8.2* 8.5*  ALBUMIN 2.7*  --   --   --   INR 1.09  --   --   --    < > = values in this interval not displayed.   Recent Labs  Lab 08/25/18 1607  AST 16  ALT 8  ALKPHOS 44  BILITOT 0.8  PROT 8.2*   Recent Labs  Lab 08/25/18 1607 08/25/18 2301 08/26/18 0558  WBC 3.9* 3.7* 4.0  NEUTROABS 2.9 2.6  --   HGB 7.1* 7.1* 11.6*  HCT 23.6* 23.3* 36.3  MCV 100.4* 99.6 93.1  PLT 234 228 263

## 2018-08-27 NOTE — Progress Notes (Signed)
  Echocardiogram 2D Echocardiogram has been performed.  [name] 08/27/2018, 4:29 PM

## 2018-08-27 NOTE — Progress Notes (Addendum)
Referring Physician(s): Schertz,R  Supervising Physician: Markus Daft  Patient Status:  [facility] - In-pt  Chief Complaint:  End-stage renal disease, clotted left upper arm dialysis fistula  Subjective: Patient familiar to IR service from prior bone marrow biopsy in 2015.  She has a history of end-stage renal disease with existing left upper arm AV fistula, anemia,  multiple myeloma, hypertension, GERD, depression, and anxiety.  She is a Restaurant manager, fast food.  She has had recent poor function and probable thrombosis of left upper arm AV fistula and request now received for fistulogram with possible thrombolysis as well as potential need for temporary HD catheter.  She has also had some recurrent issues with altered mental status with some psychotic features associated agitation and paranoia.  Past Medical History:  Diagnosis Date  . Anxiety   . Closed fracture of right distal femur ([place]) 09/01/2015  . Depression   . ESRD (end stage renal disease) on dialysis [facility])    "TTS; [place] [facility]" (04/24/2017)  . GERD (gastroesophageal reflux disease) 04/23/2015  . HCAP (healthcare-associated pneumonia) 04/24/2017  . Heart murmur   . History of blood transfusion    "low HgB when I was going to dialysis center"  . Hypertension   . Hypertension associated with end stage renal disease on dialysis 03/11/2014  . Malnutrition of moderate degree 09/02/2015  . Multiple myeloma ([place]) 03/11/2014  . Patient is Jehovah's Witness    "I'd rather never have any blood transfusions unless absolutely necessary; please check with me 1st". (04/24/2017)  . Pneumonia ~ 2016   Past Surgical History:  Procedure Laterality Date  . AV FISTULA PLACEMENT Left   . I&D EXTREMITY Left 02/27/2016   Procedure: ARTHROSCOPIC IRRIGATION AND DEBRIDEMENT EXTREMITY;  Surgeon: Renette Butters, MD;  Location: [facility];  Service: Orthopedics;  Laterality: Left;  . TEE WITHOUT CARDIOVERSION N/A 02/29/2016   Procedure: TRANSESOPHAGEAL  ECHOCARDIOGRAM (TEE);  Surgeon: Thayer Headings, MD;  Location: [facility] ENDOSCOPY;  Service: Cardiovascular;  Laterality: N/A;       Allergies: Phenergan [promethazine hcl]  Medications: Prior to Admission medications   Medication Sig Start Date End Date Taking? Authorizing Provider  acetaminophen (TYLENOL) 325 MG tablet Take 650 mg by mouth every 6 (six) hours as needed for fever or headache (pain). Do not exceed 3000 mg in 24 hours   Yes [provider]  amLODipine (NORVASC) 10 MG tablet Take 10 mg by mouth every evening. Hold for SBP <100MM/HG   Yes [provider]  bisacodyl (DULCOLAX) 10 MG suppository Place 10 mg rectally daily as needed for moderate constipation (constipation not relieved by MOM).   Yes [provider]  calcium acetate (PHOSLO) 667 MG capsule Take 1,334 mg by mouth 3 (three) times daily with meals.    Yes [provider]  cloNIDine (CATAPRES - DOSED IN MG/24 HR) 0.2 mg/24hr patch Place 1 patch (0.2 mg total) onto the skin once a week. 06/04/18  Yes Emokpae, Courage, MD  cloNIDine (CATAPRES) 0.1 MG tablet Take 0.1 mg by mouth every 8 (eight) hours as needed (sbp >180).    Yes [provider]  hydrocortisone cream 1 % Apply 1 application topically daily as needed for itching.   Yes [provider]  isosorbide mononitrate (IMDUR) 60 MG 24 hr tablet Take 60 mg by mouth daily.  03/24/15  Yes [provider]  lamoTRIgine (LAMICTAL) 25 MG tablet Take 50 mg by mouth daily.    Yes [provider]  latanoprost (XALATAN) 0.005 %  ophthalmic solution Place 1 drop into both eyes at bedtime.    Yes [provider]  loperamide (IMODIUM A-D) 2 MG tablet Take 4 mg by mouth daily as needed (on Dialysis days).    Yes [provider]  LORazepam (ATIVAN) 0.5 MG tablet Take 0.5 mg by mouth at bedtime.   Yes [provider]  losartan (COZAAR) 50 MG tablet Take 50 mg by mouth every evening.   Yes  [provider]  meropenem (MERREM) IVPB Inject 500 mg into the vein daily for 10 days. Indication:  Achromobacter Bacteremia Last Day of Therapy:  08/29/18 Labs - Once weekly:  CBC/D and BMP, Labs - Every other week:  ESR and CRP 08/20/18 08/30/18 Yes Rai, Ripudeep K, MD  multivitamin (RENA-VIT) TABS tablet Take 1 tablet by mouth daily.   Yes [provider]  ranitidine (ZANTAC) 300 MG tablet Take 300 mg by mouth at bedtime. Reported on 03/05/2016   Yes [provider]  sertraline (ZOLOFT) 50 MG tablet Take 50 mg by mouth daily.   Yes [provider]  sevelamer carbonate (RENVELA) 800 MG tablet Take 2 tablets (1,600 mg total) by mouth 3 (three) times daily with meals. 05/30/18  Yes Emokpae, Courage, MD  simethicone (MYLICON) 703 MG chewable tablet Chew 125 mg by mouth daily. 6PM   Yes [provider]  Sodium Phosphates (FLEET ENEMA RE) Place 1 enema rectally daily as needed (constipation not relieved by bisacodyl suppository).   Yes [provider]  valACYclovir (VALTREX) 500 MG tablet Take 1 tablet (500 mg total) by mouth every other day. 03/01/16  Yes Hongalgi, Lenis Dickinson, MD     Vital Signs: BP (!) 146/90 (BP Location: Right Leg)   Pulse (!) 50   Temp 98.3 F (36.8 C) (Oral)   Resp 18   Ht '5\' 3"'  (1.6 m)   Wt 132 lb 4.4 oz (60 kg)   SpO2 99%   BMI 23.43 kg/m   Physical Exam awake, does answer a few questions but not completely oriented.  Chest clear to auscultation bilaterally anteriorly.  Heart with regular rate and rhythm. Abdomen soft, positive bowel sounds, nontender.  Trace to 1+ lower extremity edema.  Left upper arm AV fistula aneurysmal with no bruit. Imaging: Dg Chest 2 View  Result Date: 08/25/2018 CLINICAL DATA:  Altered mental status EXAM: CHEST - 2 VIEW COMPARISON:  Chest x-rays dated 08/12/2018 and 06/26/2017 FINDINGS: Stable cardiomegaly. Lungs are clear. No pleural effusion or pneumothorax seen. Osseous structures about  the chest are unremarkable. IMPRESSION: 1. No active cardiopulmonary disease. No evidence of pneumonia or pulmonary edema. 2. Stable cardiomegaly. Electronically Signed   By: Franki Cabot M.D.   On: 08/25/2018 17:23   Ct Head Wo Contrast  Result Date: 08/25/2018 CLINICAL DATA:  New onset weakness after dialysis. Hypotension. Slurred speech. EXAM: CT HEAD WITHOUT CONTRAST TECHNIQUE: Contiguous axial images were obtained from the base of the skull through the vertex without intravenous contrast. COMPARISON:  CT head without contrast 07/26/2018 FINDINGS: Brain: Moderate atrophy and white matter is similar to the prior exam. No acute infarct, hemorrhage, or mass lesion is present. Diffuse hypoattenuation is present throughout the basal ganglia. A remote lacunar infarct is again seen in the left cerebellum. Ventricles are proportionate to the degree of atrophy. No significant extra-axial fluid collection present. Vascular: Atherosclerotic calcifications are present within the cavernous internal carotid arteries bilaterally. There is no hyperdense vessel. Skull: Multiple lucent lesions are again noted. There is no significant  interval change. Findings are consistent with known multiple myeloma. There is a focal lesion involving the left arch of C1. Sinuses/Orbits: The paranasal sinuses and mastoid air cells are clear. Globes and orbits are within normal limits. IMPRESSION: 1. Stable appearance of atrophy and white matter disease. 2. No acute intracranial abnormality. 3. Multiple lucent lesions in the calvarium and left-sided arch of C1 consistent with known multiple myeloma. There is no significant progression. Electronically Signed   By: San Morelle M.D.   On: 08/25/2018 17:22    Labs:  CBC: Recent Labs    08/25/18 1607 08/25/18 2301 08/26/18 0558 08/27/18 1254  WBC 3.9* 3.7* 4.0 4.2  HGB 7.1* 7.1* 11.6* 9.2*  HCT 23.6* 23.3* 36.3 29.0*  PLT 234 228 263 240    COAGS: Recent Labs     04/16/18 0929 08/12/18 2044 08/25/18 1607  INR 1.09 1.30 1.09  APTT 29 35 35    BMP: Recent Labs    08/26/18 0558 08/26/18 1108 08/26/18 2256 08/27/18 1057  NA 124* 122* 120* 118*  K 4.1 4.5 4.9 5.1  CL 89* 89* 86* 84*  CO2 21* 20* 22 22  GLUCOSE 84 96 81 81  BUN 49* 51* 49* 54*  CALCIUM 8.5* 8.2* 8.5* 8.4*  CREATININE 9.59* 9.88* 9.93* 10.48*  GFRNONAA 3* 3* 3* 3*  GFRAA 4* 4* 4* 4*    LIVER FUNCTION TESTS: Recent Labs    05/27/18 1715  07/26/18 1949 08/12/18 2025 08/14/18 1428 08/16/18 1247 08/19/18 0724 08/25/18 1607  BILITOT 0.9  --  0.7 1.1  --   --   --  0.8  AST 22  --  18 31  --   --   --  16  ALT 11  --  10 13  --   --   --  8  ALKPHOS 53  --  57 60  --   --   --  44  PROT 7.5  --  8.1 9.7*  --   --   --  8.2*  ALBUMIN 3.4*   < > 3.4* 3.5 2.6* 2.2* 2.2* 2.7*   < > = values in this interval not displayed.    Assessment and Plan: Pt with history of end-stage renal disease with existing left upper arm AV fistula, anemia,  multiple myeloma, hypertension, GERD, depression, and anxiety.  She is a Restaurant manager, fast food.  She has had recent poor function and probable thrombosis of left upper arm AV fistula and request now received for fistulogram with possible thrombolysis as well as potential need for temporary HD catheter.  She has also had some recurrent issues with altered mental status with some psychotic features associated agitation and paranoia.  Case has been discussed with Dr.Henn.  Plan is for temporary HD catheter placement today followed by fistulagram with possible thrombolysis on 11/29.  Voicemail left for patient's daughter regarding above plans-awaiting call back from family before proceeding with case.   Electronically Signed: D. Rowe Robert, PA-C 08/27/2018, 1:44 PM   I spent a total of 25 minutes at the the patient's bedside AND on the patient's hospital floor or unit, greater than 50% of which was counseling/coordinating care for temporary  dialysis catheter placement, fistulagram with possible thrombolysis of left upper arm AV fistula    Patient ID: [name], female   DOB: [birth year], 77 y.o.   MRN: [id number]

## 2018-08-28 DIAGNOSIS — R7881 Bacteremia: Secondary | ICD-10-CM

## 2018-08-28 DIAGNOSIS — Z1635 Resistance to multiple antimicrobial drugs: Secondary | ICD-10-CM

## 2018-08-28 DIAGNOSIS — C9000 Multiple myeloma not having achieved remission: Secondary | ICD-10-CM

## 2018-08-28 DIAGNOSIS — R29818 Other symptoms and signs involving the nervous system: Secondary | ICD-10-CM

## 2018-08-28 DIAGNOSIS — N189 Chronic kidney disease, unspecified: Secondary | ICD-10-CM

## 2018-08-28 DIAGNOSIS — N186 End stage renal disease: Secondary | ICD-10-CM

## 2018-08-28 DIAGNOSIS — D631 Anemia in chronic kidney disease: Secondary | ICD-10-CM

## 2018-08-28 DIAGNOSIS — R4189 Other symptoms and signs involving cognitive functions and awareness: Secondary | ICD-10-CM

## 2018-08-28 DIAGNOSIS — Z992 Dependence on renal dialysis: Secondary | ICD-10-CM

## 2018-08-28 LAB — CULTURE, BLOOD (ROUTINE X 2): SPECIAL REQUESTS: ADEQUATE

## 2018-08-28 LAB — CBC
HCT: 27.5 % — ABNORMAL LOW (ref 36.0–46.0)
Hemoglobin: 8.9 g/dL — ABNORMAL LOW (ref 12.0–15.0)
MCH: 29.8 pg (ref 26.0–34.0)
MCHC: 32.4 g/dL (ref 30.0–36.0)
MCV: 92 fL (ref 80.0–100.0)
Platelets: 228 10*3/uL (ref 150–400)
RBC: 2.99 MIL/uL — ABNORMAL LOW (ref 3.87–5.11)
RDW: 17.2 % — AB (ref 11.5–15.5)
WBC: 4 10*3/uL (ref 4.0–10.5)
nRBC: 0 % (ref 0.0–0.2)

## 2018-08-28 LAB — COMPREHENSIVE METABOLIC PANEL
ALK PHOS: 42 U/L (ref 38–126)
ALT: 7 U/L (ref 0–44)
ANION GAP: 12 (ref 5–15)
AST: 15 U/L (ref 15–41)
Albumin: 2.3 g/dL — ABNORMAL LOW (ref 3.5–5.0)
BILIRUBIN TOTAL: 0.7 mg/dL (ref 0.3–1.2)
BUN: 55 mg/dL — ABNORMAL HIGH (ref 8–23)
CO2: 20 mmol/L — AB (ref 22–32)
Calcium: 8.2 mg/dL — ABNORMAL LOW (ref 8.9–10.3)
Chloride: 84 mmol/L — ABNORMAL LOW (ref 98–111)
Creatinine, Ser: 11.54 mg/dL — ABNORMAL HIGH (ref 0.44–1.00)
GFR calc non Af Amer: 3 mL/min — ABNORMAL LOW (ref >60)
GFR, EST AFRICAN AMERICAN: 3 mL/min — AB (ref >60)
GLUCOSE: 71 mg/dL (ref 70–99)
POTASSIUM: 5.3 mmol/L — AB (ref 3.5–5.1)
SODIUM: 116 mmol/L — AB (ref 135–145)
TOTAL PROTEIN: 7.4 g/dL (ref 6.5–8.1)

## 2018-08-28 LAB — BASIC METABOLIC PANEL
ANION GAP: 13 (ref 5–15)
Anion gap: 13 (ref 5–15)
BUN: 54 mg/dL — AB (ref 8–23)
BUN: 56 mg/dL — ABNORMAL HIGH (ref 8–23)
CALCIUM: 8.2 mg/dL — AB (ref 8.9–10.3)
CHLORIDE: 85 mmol/L — AB (ref 98–111)
CO2: 19 mmol/L — ABNORMAL LOW (ref 22–32)
CO2: 20 mmol/L — ABNORMAL LOW (ref 22–32)
CREATININE: 10.79 mg/dL — AB (ref 0.44–1.00)
Calcium: 8.1 mg/dL — ABNORMAL LOW (ref 8.9–10.3)
Chloride: 85 mmol/L — ABNORMAL LOW (ref 98–111)
Creatinine, Ser: 11.47 mg/dL — ABNORMAL HIGH (ref 0.44–1.00)
GFR calc Af Amer: 3 mL/min — ABNORMAL LOW (ref >60)
GFR calc non Af Amer: 3 mL/min — ABNORMAL LOW (ref >60)
GFR, EST AFRICAN AMERICAN: 3 mL/min — AB (ref >60)
GFR, EST NON AFRICAN AMERICAN: 3 mL/min — AB (ref >60)
GLUCOSE: 72 mg/dL (ref 70–99)
Glucose, Bld: 82 mg/dL (ref 70–99)
POTASSIUM: 5.2 mmol/L — AB (ref 3.5–5.1)
Potassium: 5.4 mmol/L — ABNORMAL HIGH (ref 3.5–5.1)
SODIUM: 118 mmol/L — AB (ref 135–145)
Sodium: 117 mmol/L — CL (ref 135–145)

## 2018-08-28 MED ORDER — CHLORHEXIDINE GLUCONATE CLOTH 2 % EX PADS
6.0000 | MEDICATED_PAD | Freq: Every day | CUTANEOUS | Status: DC
  Administered 2018-08-30 – 2018-09-01 (×2): 6 via TOPICAL

## 2018-08-28 MED ORDER — LIDOCAINE HCL 2 % IJ SOLN
INTRAMUSCULAR | Status: AC
  Administered 2018-08-28: 400 mg
  Filled 2018-08-28: qty 20

## 2018-08-28 MED ORDER — HEPARIN SODIUM (PORCINE) 1000 UNIT/ML IJ SOLN
INTRAMUSCULAR | Status: AC
  Administered 2018-08-28: 2800 [IU]
  Filled 2018-08-28: qty 3

## 2018-08-28 MED ORDER — PIPERACILLIN-TAZOBACTAM 3.375 G IVPB
3.3750 g | Freq: Two times a day (BID) | INTRAVENOUS | Status: DC
  Administered 2018-08-28 – 2018-09-02 (×10): 3.375 g via INTRAVENOUS
  Filled 2018-08-28 (×12): qty 50

## 2018-08-28 MED ORDER — CHLORHEXIDINE GLUCONATE CLOTH 2 % EX PADS: 6.0000 | MEDICATED_PAD | Freq: Every day | CUTANEOUS | Status: DC

## 2018-08-28 NOTE — Progress Notes (Signed)
[facility] Progress Note  Subjective: much better attitude and cooperation today.  Needs access, will place at bedside.    Vitals:   08/28/18 0414 08/28/18 0616 08/28/18 0821 08/28/18 0940  BP: (!) 170/88  (!) 142/86 (!) 162/87  Pulse: (!) 50  (!) 50 (!) 42  Resp: 18  19 12   Temp: 97.8 F (36.6 C)  97.9 F (36.6 C)   TempSrc:   Axillary   SpO2: 97%  99%   Weight:  61.7 kg  63 kg  Height:        Inpatient medications: . amLODipine  10 mg Oral QPM  . calcium acetate  1,334 mg Oral TID WC  . Chlorhexidine Gluconate Cloth  6 each Topical Q0600  . Chlorhexidine Gluconate Cloth  6 each Topical Q0600  . cloNIDine  0.2 mg Transdermal Q Wed  . famotidine  20 mg Oral BID  . isosorbide mononitrate  60 mg Oral Daily  . lamoTRIgine  50 mg Oral Daily  . latanoprost  1 drop Both Eyes QHS  . lidocaine      . LORazepam  0.5 mg Oral QHS  . losartan  50 mg Oral QPM  . multivitamin  1 tablet Oral QHS  . sevelamer carbonate  1,600 mg Oral TID WC  . simethicone  120 mg Oral TID   . meropenem (MERREM) IV 500 mg (08/27/18 1313)   acetaminophen **OR** acetaminophen, bisacodyl, hydrALAZINE, ondansetron **OR** ondansetron (ZOFRAN) IV, RESOURCE THICKENUP CLEAR, saccharomyces boulardii, senna-docusate  Iron/TIBC/Ferritin/ %Sat    Component Value Date/Time   IRON 149 (H) 03/13/2014 0442   TIBC NOT CALC 03/13/2014 0442   FERRITIN 2,786 (H) 03/13/2014 0442   IRONPCTSAT NOT CALC 03/13/2014 0442    Exam: Gen looks better, interacting, confused but pleasant No jvd or bruits Chest clear bilat to bases RRR no MRG Abd soft ntnd no mass or ascites +bs Ext 1+ bilat LE edema Neuro is alert, Ox 2, nonfocal LUA AVF no bruit    Home meds:  - clonidine patch 0.2 qwk/ clonidine 0.1 tid prn/ isosorbide mononitrate 60 qd/ amlodipine 10  - sevelamer carbonate 1.6gm ac tid/ calcium acetate 1334 ac  - lamotrigine 50 qd  - ranitidine 300 hs/ prns/ vitamins/ eyedrops  Dialysis: TTS   3.5h   425/A1.5  58kg  2/2.25  Hep none   LUA AVF - hectorol 3 ug  - epo alfa 18000 tiw IV  - goes to all HD sessions but signs off early frequently    CXR 11/25 > clear, no CHF     Impression/ Plan: 1. Gen weakness- multifactorial, r/o sepsis w hypothermia on admission, also hyponatremia, signing off early from HD.  Needs HD but AVF clotted. Will place temp cath today for HD today.   2. AMS - recurrent issue, prob early dementia. Family has noticed too. TSH/ cortisol/ B12 are wnl. Had psychotic features w/ agitation and paranoia. Psychosis resolved, doing much better. Needs dialysis and Na+ correction.  3. ESRD - HD TTS. AVF L arm is clotted, IR planning for procedure tomorrow. HD today w/ temp cath, will need HD Friday and Sat as well. Family updated.  4. Volume - suspect vol ^ w/ low Na+ levels 5. MBD ckd - cont meds 6. Anemia ckd - cont esa prn, Hb down 8.9 7. HTN - cont meds, get vol down     Kelly Splinter MD [place] pager [phone]   08/28/2018, 10:36 AM   Recent Labs  Lab  08/25/18 1607  08/27/18 2325 08/28/18 0409  NA 123*   < > 117* 116*  K 4.3   < > 5.4* 5.3*  CL 86*   < > 85* 84*  CO2 23   < > 19* 20*  GLUCOSE 104*   < > 72 71  BUN 45*   < > 54* 55*  CREATININE 9.21*   < > 10.79* 11.54*  CALCIUM 8.5*   < > 8.2* 8.2*  ALBUMIN 2.7*  --   --  2.3*  INR 1.09  --   --   --    < > = values in this interval not displayed.   Recent Labs  Lab 08/25/18 1607 08/28/18 0409  AST 16 15  ALT 8 7  ALKPHOS 44 42  BILITOT 0.8 0.7  PROT 8.2* 7.4   Recent Labs  Lab 08/25/18 1607 08/25/18 2301  08/27/18 1602 08/28/18 0409  WBC 3.9* 3.7*   < > 3.6* 4.0  NEUTROABS 2.9 2.6  --   --   --   HGB 7.1* 7.1*   < > 9.0* 8.9*  HCT 23.6* 23.3*   < > 27.4* 27.5*  MCV 100.4* 99.6   < > 92.6 92.0  PLT 234 228   < > 230 228   < > = values in this interval not displayed.

## 2018-08-28 NOTE — Progress Notes (Signed)
Patient ID: [name], female   DOB: March 14, 1941, 77 y.o.   MRN: [id number]   Rescheduled declot to Fri 11/29 New NPO orders in place  Temp cath per CCM? -- plan per Dr Jonnie Finner

## 2018-08-28 NOTE — Progress Notes (Signed)
Subjective: Having central line placed for dialysis access   Antibiotics:  Anti-infectives (From admission, onward)   Start     Dose/Rate Route Frequency Ordered Stop   08/28/18 1045  piperacillin-tazobactam (ZOSYN) IVPB 3.375 g     3.375 g 12.5 mL/hr over 240 Minutes Intravenous Every 12 hours 08/28/18 1037     08/26/18 0600  meropenem (MERREM) 500 mg in sodium chloride 0.9 % 100 mL IVPB  Status:  Discontinued     500 mg 200 mL/hr over 30 Minutes Intravenous Every 24 hours 08/26/18 0412 08/28/18 1037   08/26/18 0230  meropenem (MERREM) IVPB  Status:  Discontinued    Note to Pharmacy:  Indication:  Achromobacter Bacteremia Last Day of Therapy:  08/29/18 Labs - Once weekly:  CBC/D and BMP, Labs - Every other week:  ESR and CRP     500 mg Intravenous Every 24 hours 08/26/18 0228 08/26/18 0408      Medications: Scheduled Meds: . amLODipine  10 mg Oral QPM  . calcium acetate  1,334 mg Oral TID WC  . Chlorhexidine Gluconate Cloth  6 each Topical Q0600  . Chlorhexidine Gluconate Cloth  6 each Topical Q0600  . Chlorhexidine Gluconate Cloth  6 each Topical Q0600  . Chlorhexidine Gluconate Cloth  6 each Topical Q0600  . cloNIDine  0.2 mg Transdermal Q Wed  . famotidine  20 mg Oral BID  . isosorbide mononitrate  60 mg Oral Daily  . lamoTRIgine  50 mg Oral Daily  . latanoprost  1 drop Both Eyes QHS  . lidocaine      . LORazepam  0.5 mg Oral QHS  . losartan  50 mg Oral QPM  . multivitamin  1 tablet Oral QHS  . sevelamer carbonate  1,600 mg Oral TID WC  . simethicone  120 mg Oral TID   Continuous Infusions: . piperacillin-tazobactam (ZOSYN)  IV     PRN Meds:.acetaminophen **OR** acetaminophen, bisacodyl, hydrALAZINE, ondansetron **OR** ondansetron (ZOFRAN) IV, RESOURCE THICKENUP CLEAR, saccharomyces boulardii, senna-docusate    Objective: Weight change: 1.7 kg No intake or output data in the 24 hours ending 08/28/18 1115 Blood pressure (!) 162/87, pulse (!) 42,  temperature 98.1 F (36.7 C), temperature source Oral, resp. rate 12, height '5\' 3"'  (1.6 m), weight 63 kg, SpO2 99 %. Temp:  [97.4 F (36.3 C)-99.2 F (37.3 C)] 98.1 F (36.7 C) (11/28 0940) Pulse Rate:  [42-50] 42 (11/28 0940) Resp:  [12-19] 12 (11/28 0940) BP: (142-170)/(84-94) 162/87 (11/28 0940) SpO2:  [97 %-100 %] 99 % (11/28 0940) Weight:  [61.7 kg-63 kg] 63 kg (11/28 0940)  Physical Exam: General: Alert and awake, oriented x3, not in any acute distress. The access placed neuro: nonfocal  CBC:    BMET Recent Labs    08/27/18 2325 08/28/18 0409  NA 117* 116*  K 5.4* 5.3*  CL 85* 84*  CO2 19* 20*  GLUCOSE 72 71  BUN 54* 55*  CREATININE 10.79* 11.54*  CALCIUM 8.2* 8.2*     Liver Panel  Recent Labs    08/25/18 1607 08/28/18 0409  PROT 8.2* 7.4  ALBUMIN 2.7* 2.3*  AST 16 15  ALT 8 7  ALKPHOS 44 42  BILITOT 0.8 0.7       Sedimentation Rate No results for input(s): ESRSEDRATE in the last 72 hours. C-Reactive Protein No results for input(s): CRP in the last 72 hours.  Micro Results: Recent Results (from the past 720 hour(s))  Blood Culture (  routine x 2)     Status: Abnormal   Collection Time: 08/12/18  8:25 PM  Result Value Ref Range Status   Specimen Description BLOOD RIGHT ANTECUBITAL  Final   Special Requests   Final    BOTTLES DRAWN AEROBIC AND ANAEROBIC Blood Culture results may not be optimal due to an excessive volume of blood received in culture bottles   Culture  Setup Time   Final    AEROBIC BOTTLE ONLY GRAM NEGATIVE RODS CRITICAL VALUE NOTED.  VALUE IS CONSISTENT WITH PREVIOUSLY REPORTED AND CALLED VALUE.    Culture (A)  Final    ACHROMOBACTER SPECIES SUSCEPTIBILITIES PERFORMED ON PREVIOUS CULTURE WITHIN THE LAST 5 DAYS. Performed at [facility], [place] [address] [place], [place] [address]    Report Status 08/15/2018 FINAL  Final  Blood Culture (routine x 2)     Status: Abnormal   Collection Time: 08/12/18  8:30 PM  Result  Value Ref Range Status   Specimen Description BLOOD RIGHT HAND  Final   Special Requests   Final    BOTTLES DRAWN AEROBIC AND ANAEROBIC Blood Culture adequate volume   Culture  Setup Time   Final    AEROBIC BOTTLE ONLY GRAM NEGATIVE RODS CRITICAL RESULT CALLED TO, READ BACK BY AND VERIFIED WITH: Hughie Closs [facility] 08/13/18 2103 JDW Performed at [facility], [place] [address], [place], [place] [address]    Culture ACHROMOBACTER SPECIES (A)  Final   Report Status 08/15/2018 FINAL  Final   Organism ID, Bacteria ACHROMOBACTER SPECIES  Final      Susceptibility   Achromobacter species - MIC*    CEFEPIME 16 INTERMEDIATE Intermediate     CEFAZOLIN >=64 RESISTANT Resistant     GENTAMICIN >=16 RESISTANT Resistant     CIPROFLOXACIN >=4 RESISTANT Resistant     IMIPENEM 4 SENSITIVE Sensitive     TRIMETH/SULFA <=20 SENSITIVE Sensitive     * ACHROMOBACTER SPECIES  Blood Culture ID Panel (Reflexed)     Status: None   Collection Time: 08/12/18  8:30 PM  Result Value Ref Range Status   Enterococcus species NOT DETECTED NOT DETECTED Final   Listeria monocytogenes NOT DETECTED NOT DETECTED Final   Staphylococcus species NOT DETECTED NOT DETECTED Final    Comment: CRITICAL RESULT CALLED TO, READ BACK BY AND VERIFIED WITH: Hughie Closs [facility] 08/13/18 2103 JDW    Staphylococcus aureus (BCID) NOT DETECTED NOT DETECTED Final   Streptococcus species NOT DETECTED NOT DETECTED Final   Streptococcus agalactiae NOT DETECTED NOT DETECTED Final   Streptococcus pneumoniae NOT DETECTED NOT DETECTED Final   Streptococcus pyogenes NOT DETECTED NOT DETECTED Final   Acinetobacter baumannii NOT DETECTED NOT DETECTED Final   Enterobacteriaceae species NOT DETECTED NOT DETECTED Final   Enterobacter cloacae complex NOT DETECTED NOT DETECTED Final   Escherichia coli NOT DETECTED NOT DETECTED Final   Klebsiella oxytoca NOT DETECTED NOT DETECTED Final   Klebsiella pneumoniae NOT DETECTED NOT DETECTED Final   Proteus  species NOT DETECTED NOT DETECTED Final   Serratia marcescens NOT DETECTED NOT DETECTED Final   Haemophilus influenzae NOT DETECTED NOT DETECTED Final   Neisseria meningitidis NOT DETECTED NOT DETECTED Final   Pseudomonas aeruginosa NOT DETECTED NOT DETECTED Final   Candida albicans NOT DETECTED NOT DETECTED Final   Candida glabrata NOT DETECTED NOT DETECTED Final   Candida krusei NOT DETECTED NOT DETECTED Final   Candida parapsilosis NOT DETECTED NOT DETECTED Final   Candida tropicalis NOT DETECTED NOT DETECTED Final  Comment: Performed at [facility], [place] [address] [place], [place] [address]  MRSA PCR Screening     Status: None   Collection Time: 08/13/18 12:52 AM  Result Value Ref Range Status   MRSA by PCR NEGATIVE NEGATIVE Final    Comment:        The GeneXpert MRSA Assay (FDA approved for NASAL specimens only), is one component of a comprehensive MRSA colonization surveillance program. It is not intended to diagnose MRSA infection nor to guide or monitor treatment for MRSA infections. Performed at [facility], [place] [address], [place], [place] [address]   Culture, blood (routine x 2)     Status: None (Preliminary result)   Collection Time: 08/25/18  4:04 PM  Result Value Ref Range Status   Specimen Description BLOOD RIGHT FOREARM  Final   Special Requests   Final    BOTTLES DRAWN AEROBIC ONLY Blood Culture adequate volume   Culture  Setup Time   Final    AEROBIC BOTTLE ONLY GRAM NEGATIVE RODS CRITICAL VALUE NOTED.  VALUE IS CONSISTENT WITH PREVIOUSLY REPORTED AND CALLED VALUE. Performed at [facility], [place] [address], [place], [place] [address]    Culture GRAM NEGATIVE RODS  Final   Report Status PENDING  Incomplete  Culture, blood (routine x 2)     Status: Abnormal   Collection Time: 08/25/18  4:09 PM  Result Value Ref Range Status   Specimen Description BLOOD RIGHT ANTECUBITAL  Final   Special Requests   Final    BOTTLES DRAWN AEROBIC AND  ANAEROBIC Blood Culture adequate volume   Culture  Setup Time   Final    AEROBIC BOTTLE ONLY GRAM NEGATIVE RODS CRITICAL RESULT CALLED TO, READ BACK BY AND VERIFIED WITH: Diona Browner PHARMD 08/26/18 1825 JDW Performed at [facility], [place] [address], [place], [place] [address]    Culture ACHROMOBACTER SPECIES (A)  Final   Report Status 08/28/2018 FINAL  Final   Organism ID, Bacteria ACHROMOBACTER SPECIES  Final      Susceptibility   Achromobacter species - MIC*    CEFEPIME 16 INTERMEDIATE Intermediate     CEFAZOLIN >=64 RESISTANT Resistant     GENTAMICIN >=16 RESISTANT Resistant     CIPROFLOXACIN >=4 RESISTANT Resistant     IMIPENEM 8 INTERMEDIATE Intermediate     TRIMETH/SULFA 160 RESISTANT Resistant     * ACHROMOBACTER SPECIES    Studies/Results: No results found.    Assessment/Plan:  INTERVAL HISTORY: Her Achromobacter is now resistant to CARBAPENEM's   Principal Problem:   Anemia associated with chronic renal failure Active Problems:   Multiple myeloma (HCC)   Hypertensive urgency   Hyponatremia   ESRD needing dialysis (HCC)   Pressure ulcer, stage I   Neurocognitive deficits   Weakness   Anemia   Hypothermia    [name] is a 77 y.o. female with current acromial back to her bacteremia now with the organism having developed resistance to CARBAPENEM's.  Per microbiology the organism is found to be susceptible based on lab testing to Zosyn and to ceftazidime though I am a bit worried about the reliability of the latter.  Switch to Zosyn now hopefully the CARBAPENEM therapy she got prior to insertion of her new dialysis lines may have provided some activity to eradicate this organism but I am very worried about it especially given the multidrug nature resistant nature of this organism.  Repeat blood cultures are pending   LOS: 2  days   Alcide Evener 08/28/2018, 11:15 AM

## 2018-08-28 NOTE — Progress Notes (Signed)
PROGRESS NOTE    [name]  ZHY:[id number] DOB: 1941-05-28 DOA: 08/25/2018 PCP: Hendricks Limes, MD  Brief Narrative: [name] is a 77 year old female with history of ESRD on hemodialysis, mild dementia with cognitive deficits and dysphagia, multiple myeloma on chemotherapy, chronic anemia recently hospitalized with sepsis, gram-negative bacteremia with Achromobacter species, she was treated with IV meropenem, source was not found, discharged to skilled nursing facility to complete a 2-week course of IV meropenem on 11/29. 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 -Left arm AV fistula site without overt signs or symptoms of infection -Unclear if this could be a GI pathogen in the setting of mild lower GI bleed, will need endoscopic evaluation when stable, consult GI tomorrow -Achromobacter from 1125 is multidrug-resistant, meropenem switched to Zosyn today, appreciate ID consult -Repeat blood cultures -Check 2D echocardiogram  Anemia -this is multifactorial, secondary to chronic kidney disease, multiple myeloma, chemotherapy and some low-grade GI bleed as well, was Hemoccult positive in the emergency room -transfused 2 units of PRBC overnight on 11/27 -Hemoglobin stable now -In the setting of recurrent gram-negative bacteremia with unclear source and mild GI bleed, endoscopic evaluation is likely reasonable we will  consult gastroenterology tomorrow -Difficulties with dialysis access now and needs urgent HD today  ESRD on hemodialysis -Nephrology following, significant difficulties with dialysis access and cannulating left arm AV fistula  -Plan for temporary dialysis catheter today -Per renal  Mild dementia with cognitive dysfunction -CT head unremarkable  History of multiple myeloma -Refractory, followed at [facility] -Had been on carfilzomib and dexamethasone per oncology -Valacyclovir for prophylaxis  DVT prophylaxis: SCds Code Status: Full Code Family Communication: no family at bedside Disposition Plan: SNF when workup complete  Consultants:   Nephrology  ID   Procedures:   Antimicrobials:    Subjective: -No ongoing bleeding, lot of hemodialysis access issues, has not been able to be dialyzed yet -Plan for temporary dialysis catheter today Objective: Vitals:   08/28/18 0414 08/28/18 0616 08/28/18 0821 08/28/18 0940  BP: (!) 170/88  (!) 142/86 (!) 162/87  Pulse: (!) 50  (!) 50 (!) 42  Resp: '18  19 12  ' Temp: 97.8 F (36.6 C)  97.9 F (36.6 C) 98.1 F (36.7 C)  TempSrc:   Axillary Oral  SpO2: 97%  99% 99%  Weight:  61.7 kg  63 kg  Height:       No intake or output data in the 24 hours ending 08/28/18 1120 Filed Weights   08/27/18 0630 08/28/18 0616 08/28/18 0940  Weight: 60 kg 61.7 kg 63 kg    Examination:  Gen: Really, chronically ill-appearing female, sitting up in bed, cognitive deficits noted oriented to self and place only HEENT: PERRLA, Neck supple, no JVD Lungs: Decreased breath sounds at both bases CVS: RRR,No Gallops,Rubs or new Murmurs Abd: Soft, nontender, bowel sounds present Extremities: Left arm AV fistula, site appears unremarkable Skin: no new rashes  Data Reviewed:   CBC: Recent Labs  Lab 08/25/18 1607 08/25/18 2301 08/26/18 0558 08/27/18 1254 08/27/18  1602 08/28/18 0409  WBC 3.9* 3.7* 4.0 4.2 3.6* 4.0  NEUTROABS 2.9 2.6  --   --   --    --   HGB 7.1* 7.1* 11.6* 9.2* 9.0* 8.9*  HCT 23.6* 23.3* 36.3 29.0* 27.4* 27.5*  MCV 100.4* 99.6 93.1 92.7 92.6 92.0  PLT 234 228 263 240 230 553   Basic Metabolic Panel: Recent Labs  Lab 08/26/18 1108 08/26/18 2256 08/27/18 1057 08/27/18 2325 08/28/18 0409  NA 122* 120* 118* 117* 116*  K 4.5 4.9 5.1 5.4* 5.3*  CL 89* 86* 84* 85* 84*  CO2 20* 22 22 19* 20*  GLUCOSE 96 81 81 72 71  BUN 51* 49* 54* 54* 55*  CREATININE 9.88* 9.93* 10.48* 10.79* 11.54*  CALCIUM 8.2* 8.5* 8.4* 8.2* 8.2*   GFR: Estimated Creatinine Clearance: 3.5 mL/min (A) (by C-G formula based on SCr of 11.54 mg/dL (H)). Liver Function Tests: Recent Labs  Lab 08/25/18 1607 08/28/18 0409  AST 16 15  ALT 8 7  ALKPHOS 44 42  BILITOT 0.8 0.7  PROT 8.2* 7.4  ALBUMIN 2.7* 2.3*   No results for input(s): LIPASE, AMYLASE in the last 168 hours. No results for input(s): AMMONIA in the last 168 hours. Coagulation Profile: Recent Labs  Lab 08/25/18 1607  INR 1.09   Cardiac Enzymes: No results for input(s): CKTOTAL, CKMB, CKMBINDEX, TROPONINI in the last 168 hours. BNP (last 3 results) No results for input(s): PROBNP in the last 8760 hours. HbA1C: No results for input(s): HGBA1C in the last 72 hours. CBG: No results for input(s): GLUCAP in the last 168 hours. Lipid Profile: No results for input(s): CHOL, HDL, LDLCALC, TRIG, CHOLHDL, LDLDIRECT in the last 72 hours. Thyroid Function Tests: Recent Labs    08/26/18 1051  TSH 2.777   Anemia Panel: Recent Labs    08/27/18 1602  VITAMINB12 696   Urine analysis:    Component Value Date/Time   COLORURINE YELLOW 08/12/2018 2025   APPEARANCEUR TURBID (A) 08/12/2018 2025   LABSPEC 1.010 08/12/2018 2025   PHURINE 8.0 08/12/2018 2025   GLUCOSEU 50 (A) 08/12/2018 2025   HGBUR MODERATE (A) 08/12/2018 2025   BILIRUBINUR NEGATIVE 08/12/2018 2025   KETONESUR NEGATIVE 08/12/2018 2025   PROTEINUR 100 (A) 08/12/2018 2025   UROBILINOGEN 0.2 04/10/2015 1800    NITRITE NEGATIVE 08/12/2018 2025   LEUKOCYTESUR LARGE (A) 08/12/2018 2025   Sepsis Labs: '@LABRCNTIP' (procalcitonin:4,lacticidven:4)  ) Recent Results (from the past 240 hour(s))  Culture, blood (routine x 2)     Status: None (Preliminary result)   Collection Time: 08/25/18  4:04 PM  Result Value Ref Range Status   Specimen Description BLOOD RIGHT FOREARM  Final   Special Requests   Final    BOTTLES DRAWN AEROBIC ONLY Blood Culture adequate volume   Culture  Setup Time   Final    AEROBIC BOTTLE ONLY GRAM NEGATIVE RODS CRITICAL VALUE NOTED.  VALUE IS CONSISTENT WITH PREVIOUSLY REPORTED AND CALLED VALUE. Performed at [facility], [place] [address], [place], [place] [address]    Culture GRAM NEGATIVE RODS  Final   Report Status PENDING  Incomplete  Culture, blood (routine x 2)     Status: Abnormal   Collection Time: 08/25/18  4:09 PM  Result Value Ref Range Status   Specimen Description BLOOD RIGHT ANTECUBITAL  Final   Special Requests   Final    BOTTLES DRAWN AEROBIC AND ANAEROBIC Blood Culture adequate volume   Culture  Setup Time   Final  AEROBIC BOTTLE ONLY GRAM NEGATIVE RODS CRITICAL RESULT CALLED TO, READ BACK BY AND VERIFIED WITH: Diona Browner PHARMD 08/26/18 1825 JDW Performed at [facility] [address] [place], [place] [address]    Culture ACHROMOBACTER SPECIES (A)  Final   Report Status 08/28/2018 FINAL  Final   Organism ID, Bacteria ACHROMOBACTER SPECIES  Final      Susceptibility   Achromobacter species - MIC*    CEFEPIME 16 INTERMEDIATE Intermediate     CEFAZOLIN >=64 RESISTANT Resistant     GENTAMICIN >=16 RESISTANT Resistant     CIPROFLOXACIN >=4 RESISTANT Resistant     IMIPENEM 8 INTERMEDIATE Intermediate     TRIMETH/SULFA 160 RESISTANT Resistant     * ACHROMOBACTER SPECIES         Radiology Studies: No results found.      Scheduled Meds: . amLODipine  10 mg Oral QPM  . calcium acetate  1,334 mg Oral TID WC  . Chlorhexidine Gluconate  Cloth  6 each Topical Q0600  . Chlorhexidine Gluconate Cloth  6 each Topical Q0600  . Chlorhexidine Gluconate Cloth  6 each Topical Q0600  . cloNIDine  0.2 mg Transdermal Q Wed  . famotidine  20 mg Oral BID  . isosorbide mononitrate  60 mg Oral Daily  . lamoTRIgine  50 mg Oral Daily  . latanoprost  1 drop Both Eyes QHS  . lidocaine      . LORazepam  0.5 mg Oral QHS  . losartan  50 mg Oral QPM  . multivitamin  1 tablet Oral QHS  . sevelamer carbonate  1,600 mg Oral TID WC  . simethicone  120 mg Oral TID   Continuous Infusions: . piperacillin-tazobactam (ZOSYN)  IV       LOS: 2 days    Time spent: 79mn    PDomenic Polite MD [facility] Page via [email or web address], password [email or web address] After 7PM please contact night-coverage  08/28/2018, 11:20 AM

## 2018-08-28 NOTE — Procedures (Signed)
Under sterile conditions a 20 cm Trialysis catheter was placed into the R femoral vein using US guidance and Seldinger technique.  NO complications, ready to use.  Indicatino: clotted access/ esrd/ uremia/ hyponatremia  Kelly Splinter MD [employer] pgr [phone]   08/28/2018, 10:22 AM

## 2018-08-29 ENCOUNTER — Encounter (HOSPITAL_COMMUNITY): Payer: Self-pay | Admitting: Interventional Radiology

## 2018-08-29 ENCOUNTER — Inpatient Hospital Stay (HOSPITAL_COMMUNITY): Payer: Medicare Other

## 2018-08-29 DIAGNOSIS — N186 End stage renal disease: Secondary | ICD-10-CM

## 2018-08-29 DIAGNOSIS — B9689 Other specified bacterial agents as the cause of diseases classified elsewhere: Secondary | ICD-10-CM

## 2018-08-29 DIAGNOSIS — Z992 Dependence on renal dialysis: Secondary | ICD-10-CM

## 2018-08-29 DIAGNOSIS — Z1629 Resistance to other single specified antibiotic: Secondary | ICD-10-CM

## 2018-08-29 DIAGNOSIS — T827XXA Infection and inflammatory reaction due to other cardiac and vascular devices, implants and grafts, initial encounter: Secondary | ICD-10-CM

## 2018-08-29 HISTORY — PX: IR FLUORO GUIDE CV LINE RIGHT: IMG2283

## 2018-08-29 HISTORY — PX: IR US GUIDE VASC ACCESS RIGHT: IMG2390

## 2018-08-29 HISTORY — PX: IR DIALY SHUNT INTRO NEEDLE/INTRACATH INITIAL W/IMG LEFT: IMG6102

## 2018-08-29 LAB — CBC
HEMATOCRIT: 28.6 % — AB (ref 36.0–46.0)
Hemoglobin: 8.7 g/dL — ABNORMAL LOW (ref 12.0–15.0)
MCH: 28.7 pg (ref 26.0–34.0)
MCHC: 30.4 g/dL (ref 30.0–36.0)
MCV: 94.4 fL (ref 80.0–100.0)
PLATELETS: 199 10*3/uL (ref 150–400)
RBC: 3.03 MIL/uL — ABNORMAL LOW (ref 3.87–5.11)
RDW: 17.1 % — AB (ref 11.5–15.5)
WBC: 2.7 10*3/uL — ABNORMAL LOW (ref 4.0–10.5)
nRBC: 0 % (ref 0.0–0.2)

## 2018-08-29 LAB — CULTURE, BLOOD (ROUTINE X 2): Special Requests: ADEQUATE

## 2018-08-29 LAB — BASIC METABOLIC PANEL
Anion gap: 8 (ref 5–15)
BUN: 24 mg/dL — ABNORMAL HIGH (ref 8–23)
CALCIUM: 8.3 mg/dL — AB (ref 8.9–10.3)
CO2: 24 mmol/L (ref 22–32)
CREATININE: 7.5 mg/dL — AB (ref 0.44–1.00)
Chloride: 95 mmol/L — ABNORMAL LOW (ref 98–111)
GFR calc Af Amer: 5 mL/min — ABNORMAL LOW (ref >60)
GFR calc non Af Amer: 5 mL/min — ABNORMAL LOW (ref >60)
GLUCOSE: 69 mg/dL — AB (ref 70–99)
Potassium: 4.2 mmol/L (ref 3.5–5.1)
Sodium: 127 mmol/L — ABNORMAL LOW (ref 135–145)

## 2018-08-29 LAB — HEAVY METALS, BLOOD
Arsenic: 6 ug/L (ref 2–23)
Lead: 2 ug/dL (ref 0–4)
Mercury: 1.2 ug/L (ref 0.0–14.9)

## 2018-08-29 MED ORDER — LIDOCAINE HCL 1 % IJ SOLN
INTRAMUSCULAR | Status: AC
  Filled 2018-08-29: qty 20

## 2018-08-29 MED ORDER — HEPARIN SODIUM (PORCINE) 1000 UNIT/ML IJ SOLN
INTRAMUSCULAR | Status: AC | PRN
  Administered 2018-08-29: 3200 [IU] via INTRAVENOUS

## 2018-08-29 MED ORDER — MIDAZOLAM HCL 2 MG/2ML IJ SOLN
INTRAMUSCULAR | Status: AC | PRN
  Administered 2018-08-29: 0.5 mg via INTRAVENOUS

## 2018-08-29 MED ORDER — LIDOCAINE-EPINEPHRINE (PF) 1 %-1:200000 IJ SOLN
INTRAMUSCULAR | Status: AC
  Filled 2018-08-29: qty 30

## 2018-08-29 MED ORDER — GELATIN ABSORBABLE 12-7 MM EX MISC
CUTANEOUS | Status: AC | PRN
  Administered 2018-08-29: 1 via TOPICAL

## 2018-08-29 MED ORDER — MIDAZOLAM HCL 2 MG/2ML IJ SOLN
INTRAMUSCULAR | Status: AC
  Filled 2018-08-29: qty 2

## 2018-08-29 MED ORDER — SODIUM CHLORIDE 0.9 % IV SOLN
INTRAVENOUS | Status: AC | PRN
  Administered 2018-08-29: 10 mL/h via INTRAVENOUS

## 2018-08-29 MED ORDER — HEPARIN SODIUM (PORCINE) 1000 UNIT/ML IJ SOLN
INTRAMUSCULAR | Status: AC
  Filled 2018-08-29: qty 1

## 2018-08-29 MED ORDER — FENTANYL CITRATE (PF) 100 MCG/2ML IJ SOLN
INTRAMUSCULAR | Status: AC | PRN
  Administered 2018-08-29: 25 ug via INTRAVENOUS

## 2018-08-29 MED ORDER — CEFAZOLIN SODIUM-DEXTROSE 2-4 GM/100ML-% IV SOLN
INTRAVENOUS | Status: AC
  Filled 2018-08-29: qty 100

## 2018-08-29 MED ORDER — CEFAZOLIN (ANCEF) 1 G IV SOLR
INTRAVENOUS | Status: AC | PRN
  Administered 2018-08-29: 2 g

## 2018-08-29 MED ORDER — GELATIN ABSORBABLE 12-7 MM EX MISC
CUTANEOUS | Status: AC
  Filled 2018-08-29: qty 1

## 2018-08-29 MED ORDER — FAMOTIDINE 20 MG PO TABS
20.0000 mg | ORAL_TABLET | Freq: Every day | ORAL | Status: DC
  Administered 2018-08-30 – 2018-09-02 (×4): 20 mg via ORAL
  Filled 2018-08-29 (×4): qty 1

## 2018-08-29 MED ORDER — LIDOCAINE-EPINEPHRINE (PF) 1 %-1:200000 IJ SOLN
INTRAMUSCULAR | Status: AC | PRN
  Administered 2018-08-29: 10 mL via INTRADERMAL

## 2018-08-29 MED ORDER — IOPAMIDOL (ISOVUE-300) INJECTION 61%
INTRAVENOUS | Status: AC
  Administered 2018-08-29: 5 mL
  Filled 2018-08-29: qty 100

## 2018-08-29 MED ORDER — FENTANYL CITRATE (PF) 100 MCG/2ML IJ SOLN
INTRAMUSCULAR | Status: AC
  Filled 2018-08-29: qty 2

## 2018-08-29 NOTE — Progress Notes (Signed)
[facility] Progress Note  Subjective: had HD yest w/ o difficulties, had RIJ TDC placed this am. Has no new c/o.   Na+ 127 today, creat down 7.5  Vitals:   08/29/18 1100 08/29/18 1105 08/29/18 1136 08/29/18 1244  BP: (!) 152/95 (!) 159/94 (!) 157/92 (!) 155/89  Pulse: (!) 103 (!) 103  (!) 50  Resp: 20 18  18   Temp:    97.9 F (36.6 C)  TempSrc:    Oral  SpO2: 98% 99%  100%  Weight:      Height:        Inpatient medications: . amLODipine  10 mg Oral QPM  . calcium acetate  1,334 mg Oral TID WC  . Chlorhexidine Gluconate Cloth  6 each Topical Q0600  . Chlorhexidine Gluconate Cloth  6 each Topical Q0600  . Chlorhexidine Gluconate Cloth  6 each Topical Q0600  . cloNIDine  0.2 mg Transdermal Q Wed  . [START ON 08/30/2018] famotidine  20 mg Oral Daily  . fentaNYL      . gelatin adsorbable      . heparin      . isosorbide mononitrate  60 mg Oral Daily  . lamoTRIgine  50 mg Oral Daily  . latanoprost  1 drop Both Eyes QHS  . lidocaine-EPINEPHrine      . LORazepam  0.5 mg Oral QHS  . losartan  50 mg Oral QPM  . midazolam      . multivitamin  1 tablet Oral QHS  . sevelamer carbonate  1,600 mg Oral TID WC  . simethicone  120 mg Oral TID   . ceFAZolin    . piperacillin-tazobactam (ZOSYN)  IV 3.375 g (08/29/18 0307)   acetaminophen **OR** acetaminophen, bisacodyl, hydrALAZINE, ondansetron **OR** ondansetron (ZOFRAN) IV, RESOURCE THICKENUP CLEAR, saccharomyces boulardii, senna-docusate  Iron/TIBC/Ferritin/ %Sat    Component Value Date/Time   IRON 149 (H) 03/13/2014 0442   TIBC NOT CALC 03/13/2014 0442   FERRITIN 2,786 (H) 03/13/2014 0442   IRONPCTSAT NOT CALC 03/13/2014 0442    Exam: Gen looks better, interacting, confused but pleasant No jvd or bruits Chest clear bilat to bases RRR no MRG Abd soft ntnd no mass or ascites +bs Ext 1+ bilat LE edema Neuro is alert, Ox 2, nonfocal LUA AVF no bruit    Home meds:  - clonidine patch 0.2 qwk/ clonidine  0.1 tid prn/ isosorbide mononitrate 60 qd/ amlodipine 10  - sevelamer carbonate 1.6gm ac tid/ calcium acetate 1334 ac  - lamotrigine 50 qd  - ranitidine 300 hs/ prns/ vitamins/ eyedrops  Dialysis: TTS   3.5h   425/A1.5  58kg  2/2.25  Hep none   LUA AVF - hectorol 3 ug  - epo alfa 18000 tiw IV  - goes to all HD sessions but signs off early frequently    CXR 11/25 > clear, no CHF     Impression/ Plan: 1. Gen weakness- multifactorial 2. AMS - dementia/ psychosis, not sure, vs delirium. Improving.  3. ESRD - HD TTS. HD tomorrow.  4. HD access - AVF L arm clotted and not likely to be salvaged. Have d/w IR, they will place [facility] today as new access. Will have pall care meeting w/ family and discuss QOL, etc., in light of need for new perm access vs just keep catheter given guarded prognosis.   5. Volume excess - improving, 3L UF yest.  6. MBD ckd - cont meds 7. Anemia ckd - cont esa prn, Hb down 8.9 8.  HTN - cont meds x 3 + lower vol     Kelly Splinter MD [facility] pager [phone]   08/29/2018, 2:53 PM   Recent Labs  Lab 08/25/18 1607  08/28/18 0409 08/28/18 0850 08/29/18 1106  NA 123*   < > 116* 118* 127*  K 4.3   < > 5.3* 5.2* 4.2  CL 86*   < > 84* 85* 95*  CO2 23   < > 20* 20* 24  GLUCOSE 104*   < > 71 82 69*  BUN 45*   < > 55* 56* 24*  CREATININE 9.21*   < > 11.54* 11.47* 7.50*  CALCIUM 8.5*   < > 8.2* 8.1* 8.3*  ALBUMIN 2.7*  --  2.3*  --   --   INR 1.09  --   --   --   --    < > = values in this interval not displayed.   Recent Labs  Lab 08/25/18 1607 08/28/18 0409  AST 16 15  ALT 8 7  ALKPHOS 44 42  BILITOT 0.8 0.7  PROT 8.2* 7.4   Recent Labs  Lab 08/25/18 1607 08/25/18 2301  08/28/18 0409 08/29/18 0420  WBC 3.9* 3.7*   < > 4.0 2.7*  NEUTROABS 2.9 2.6  --   --   --   HGB 7.1* 7.1*   < > 8.9* 8.7*  HCT 23.6* 23.3*   < > 27.5* 28.6*  MCV 100.4* 99.6   < > 92.0 94.4  PLT 234 228   < > 228 199   < > = values in this interval not  displayed.

## 2018-08-29 NOTE — Progress Notes (Signed)
PROGRESS NOTE    [name]  WUJ:[id number] DOB: 10-30-1940 DOA: 08/25/2018 PCP: Hendricks Limes, MD  Brief Narrative: [name] is a 77 year old female with history of ESRD on hemodialysis, mild dementia with cognitive deficits and dysphagia, multiple myeloma on chemotherapy, chronic anemia recently hospitalized with sepsis, gram-negative bacteremia with Achromobacter species, she was treated with IV meropenem, source was not found, discharged to skilled nursing facility to complete a 2-week course of IV meropenem on 11/29. 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 -Left arm AV fistula site without overt signs or symptoms of infection -Achromobacter from 11/25 is multidrug-resistant, IV meropenem was switched to Zosyn 11/28 -appreciate infectious disease input  -Repeat blood cultures from 11/28- so far  -2D echocardiogram completed yesterday did not show any evidence of vegetations  Anemia -this is multifactorial, secondary to chronic kidney disease, multiple myeloma, chemotherapy and some low-grade GI bleed as well, was Hemoccult positive in the emergency room -transfused 2 units of PRBC on 11/27 -Hemoglobin stable now -In the setting of recurrent gram-negative bacteremia with unclear source and mild GI bleed, will need endoscopic evaluation when stable, then discussed with [facility]  gastroenterology Dr. Cristina Gong this morning, due to ongoing issues with hyponatremia, mental status and HD access plan to defer gastroenterology work-up until more stable from the standpoint hopefully in 48 hours -Difficulties with dialysis access now  ESRD on hemodialysis -Nephrology following, significant difficulties with dialysis access and cannulating left arm AV fistula  -s/p temporary dialysis catheter in IR today  Mild dementia with cognitive dysfunction -CT head unremarkable  History of multiple myeloma -Refractory, followed at [facility] -Had been on carfilzomib and dexamethasone per oncology -Valacyclovir for prophylaxis  DVT prophylaxis: SCds Code Status: Full Code Family Communication: no family at bedside Disposition Plan: SNF when workup complete  Consultants:   Nephrology  ID   Procedures:   Antimicrobials:    Subjective: -no new complaints -went for HD catheter placement in IR today  Objective: Vitals:   08/29/18 1100 08/29/18 1105 08/29/18 1136 08/29/18 1244  BP: (!) 152/95 (!) 159/94 (!) 157/92 (!) 155/89  Pulse: (!) 103 (!) 103  (!) 50  Resp: _0 Temp:    97.9 F (36.6 C)  TempSrc:    Oral  SpO2: 98% 99%  100%  Weight:      Height:        Intake/Output Summary (Last 24 hours) at 08/29/2018 1333 Last data filed at 08/28/2018 1843 Gross per 24 hour  Intake 369.4 ml  Output 3000 ml  Net -2630.6 ml   Filed Weights   08/28/18 0940 08/28/18 1348 08/29/18 0500  Weight: 63 kg 60.2 kg 62.8 kg    Examination:  Gen: chronically ill-appearing female, sitting up in bed, cognitive deficits noted oriented to self and place only HEENT: PERRLA, Neck supple, no JVD, IJ HD catheter Lungs: Good air movement bilaterally, CTAB CVS: RRR,No Gallops,Rubs or new Murmurs Abd: soft, Non tender, non distended, BS present Extremities: Left  arm AV fistula, site appears unremarkable Skin: no new rashes  Data Reviewed:   CBC: Recent Labs  Lab  08/25/18 1607 08/25/18 2301 08/26/18 0558 08/27/18 1254 08/27/18 1602 08/28/18 0409 08/29/18 0420  WBC 3.9* 3.7* 4.0 4.2 3.6* 4.0 2.7*  NEUTROABS 2.9 2.6  --   --   --   --   --   HGB 7.1* 7.1* 11.6* 9.2* 9.0* 8.9* 8.7*  HCT 23.6* 23.3* 36.3 29.0* 27.4* 27.5* 28.6*  MCV 100.4* 99.6 93.1 92.7 92.6 92.0 94.4  PLT 234 228 263 240 230 228 161   Basic Metabolic Panel: Recent Labs  Lab 08/27/18 1057 08/27/18 2325 08/28/18 0409 08/28/18 0850 08/29/18 1106  NA 118* 117* 116* 118* 127*  K 5.1 5.4* 5.3* 5.2* 4.2  CL 84* 85* 84* 85* 95*  CO2 22 19* 20* 20* 24  GLUCOSE 81 72 71 82 69*  BUN 54* 54* 55* 56* 24*  CREATININE 10.48* 10.79* 11.54* 11.47* 7.50*  CALCIUM 8.4* 8.2* 8.2* 8.1* 8.3*   GFR: Estimated Creatinine Clearance: 5 mL/min (A) (by C-G formula based on SCr of 7.5 mg/dL (H)). Liver Function Tests: Recent Labs  Lab 08/25/18 1607 08/28/18 0409  AST 16 15  ALT 8 7  ALKPHOS 44 42  BILITOT 0.8 0.7  PROT 8.2* 7.4  ALBUMIN 2.7* 2.3*   No results for input(s): LIPASE, AMYLASE in the last 168 hours. No results for input(s): AMMONIA in the last 168 hours. Coagulation Profile: Recent Labs  Lab 08/25/18 1607  INR 1.09   Cardiac Enzymes: No results for input(s): CKTOTAL, CKMB, CKMBINDEX, TROPONINI in the last 168 hours. BNP (last 3 results) No results for input(s): PROBNP in the last 8760 hours. HbA1C: No results for input(s): HGBA1C in the last 72 hours. CBG: No results for input(s): GLUCAP in the last 168 hours. Lipid Profile: No results for input(s): CHOL, HDL, LDLCALC, TRIG, CHOLHDL, LDLDIRECT in the last 72 hours. Thyroid Function Tests: No results for input(s): TSH, T4TOTAL, FREET4, T3FREE, THYROIDAB in the last 72 hours. Anemia Panel: Recent Labs    08/27/18 1602  VITAMINB12 696   Urine analysis:    Component Value Date/Time   COLORURINE YELLOW 08/12/2018 2025   APPEARANCEUR TURBID (A) 08/12/2018 2025   LABSPEC 1.010 08/12/2018 2025   PHURINE 8.0  08/12/2018 2025   GLUCOSEU 50 (A) 08/12/2018 2025   HGBUR MODERATE (A) 08/12/2018 2025   BILIRUBINUR NEGATIVE 08/12/2018 2025   KETONESUR NEGATIVE 08/12/2018 2025   PROTEINUR 100 (A) 08/12/2018 2025   UROBILINOGEN 0.2 04/10/2015 1800   NITRITE NEGATIVE 08/12/2018 2025   LEUKOCYTESUR LARGE (A) 08/12/2018 2025   Sepsis Labs: _0 (procalcitonin:4,lacticidven:4)  ) Recent Results (from the past 240 hour(s))  Culture, blood (routine x 2)     Status: Abnormal   Collection Time: 08/25/18  4:04 PM  Result Value Ref Range Status   Specimen Description BLOOD RIGHT FOREARM  Final   Special Requests   Final    BOTTLES DRAWN AEROBIC ONLY Blood Culture adequate volume   Culture  Setup Time   Final    AEROBIC BOTTLE ONLY GRAM NEGATIVE RODS CRITICAL VALUE NOTED.  VALUE IS CONSISTENT WITH PREVIOUSLY REPORTED AND CALLED VALUE.    Culture (A)  Final    ACHROMOBACTER SPECIES SUSCEPTIBILITIES PERFORMED ON PREVIOUS CULTURE WITHIN THE LAST 5 DAYS. Performed at [facility], [place] [address] [place], [place] [address]    Report Status 08/29/2018 FINAL  Final  Culture, blood (routine x 2)  Status: Abnormal   Collection Time: 08/25/18  4:09 PM  Result Value Ref Range Status   Specimen Description BLOOD RIGHT ANTECUBITAL  Final   Special Requests   Final    BOTTLES DRAWN AEROBIC AND ANAEROBIC Blood Culture adequate volume   Culture  Setup Time   Final    AEROBIC BOTTLE ONLY GRAM NEGATIVE RODS CRITICAL RESULT CALLED TO, READ BACK BY AND VERIFIED WITH: Diona Browner PHARMD 08/26/18 1825 JDW Performed at [facility], [place] [address] [place], [place] [address]    Culture ACHROMOBACTER SPECIES (A)  Final   Report Status 08/28/2018 FINAL  Final   Organism ID, Bacteria ACHROMOBACTER SPECIES  Final      Susceptibility   Achromobacter species - MIC*    CEFEPIME 16 INTERMEDIATE Intermediate     CEFAZOLIN >=64 RESISTANT Resistant     GENTAMICIN >=16 RESISTANT Resistant     CIPROFLOXACIN >=4  RESISTANT Resistant     IMIPENEM 8 INTERMEDIATE Intermediate     TRIMETH/SULFA 160 RESISTANT Resistant     * ACHROMOBACTER SPECIES  Culture, blood (routine x 2)     Status: None (Preliminary result)   Collection Time: 08/28/18  8:42 AM  Result Value Ref Range Status   Specimen Description BLOOD RIGHT ANTECUBITAL  Final   Special Requests   Final    BOTTLES DRAWN AEROBIC AND ANAEROBIC Blood Culture adequate volume   Culture   Final    NO GROWTH 1 DAY Performed at [facility], [place] [address], [place], [place] [address]    Report Status PENDING  Incomplete  Culture, blood (routine x 2)     Status: None (Preliminary result)   Collection Time: 08/28/18  8:42 AM  Result Value Ref Range Status   Specimen Description BLOOD BLOOD RIGHT FOREARM  Final   Special Requests   Final    BOTTLES DRAWN AEROBIC AND ANAEROBIC Blood Culture adequate volume   Culture   Final    NO GROWTH 1 DAY Performed at [facility], [place] [address], [place], [place] [address]    Report Status PENDING  Incomplete         Radiology Studies: Ir Fluoro Guide Cv Line Right  Result Date: 08/29/2018 INDICATION: End-stage renal disease, 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 Daryll Brod, MD Guidance:  Ultrasound and fluoroscopic FLUOROSCOPY TIME:  Fluoroscopy Time: 1 minutes 0 seconds (2 mGy). MEDICATIONS: Ancef 2 g administered within 1 hour of the procedure ANESTHESIA/SEDATION: Versed 0.5 mg IV; Fentanyl 25 mcg IV; Moderate Sedation Time:  17 minutes The patient was continuously monitored during the procedure by the interventional radiology nurse under my direct supervision. CONTRAST:  None. COMPLICATIONS: None immediate. PROCEDURE: Informed consent was obtained from the patient following explanation of the  procedure, risks, benefits and alternatives. The patient understands, agrees and consents for the procedure. All questions were addressed. A time out was performed. Maximal barrier sterile technique utilized including caps, mask, sterile gowns, sterile gloves, large sterile drape, hand hygiene, and 2% chlorhexidine scrub. Under sterile conditions and local anesthesia, right internal jugular micropuncture venous access was performed with ultrasound. Images were obtained for documentation. A guide wire was inserted followed by a transitional dilator. Next, a 0.035 guidewire was advanced into the IVC with a 5-French catheter. Measurements were obtained from the right venotomy site to  the proximal right atrium. In the right infraclavicular chest, a subcutaneous tunnel was created under sterile conditions and local anesthesia. 1% lidocaine with epinephrine was utilized for this. The 19 cm tip to cuff palindrome catheter was tunneled subcutaneously to the venotomy site and inserted into the SVC/RA junction through a valved peel-away sheath. Position was confirmed with fluoroscopy. Images were obtained for documentation. Blood was aspirated from the catheter followed by saline and heparin flushes. The appropriate volume and strength of heparin was instilled in each lumen. Caps were applied. The catheter was secured at the tunnel site with Gelfoam and a pursestring suture. The venotomy site was closed with subcuticular Vicryl suture. Dermabond was applied to the small right neck incision. A dry sterile dressing was applied. The catheter is ready for use. No immediate complications. IMPRESSION: Ultrasound and fluoroscopically guided right internal jugular tunneled hemodialysis catheter (19 cm tip to cuff palindrome catheter). Electronically Signed   By: Jerilynn Mages.  Shick M.D.   On: 08/29/2018 09:47   Ir US Guide Vasc Access Right  Result Date: 08/29/2018 INDICATION: End-stage renal disease, aneurysmal chronic occluded left  upper extremity AV fistula, no current permanent access EXAM: ULTRASOUND GUIDANCE FOR VASCULAR ACCESS RIGHT INTERNAL JUGULAR PERMANENT HEMODIALYSIS CATHETER Date:  08/29/2018 08/29/2018 9:41 am Radiologist:  Jerilynn Mages. Daryll Brod, MD Guidance:  Ultrasound and fluoroscopic FLUOROSCOPY TIME:  Fluoroscopy Time: 1 minutes 0 seconds (2 mGy). MEDICATIONS: Ancef 2 g administered within 1 hour of the procedure ANESTHESIA/SEDATION: Versed 0.5 mg IV; Fentanyl 25 mcg IV; Moderate Sedation Time:  17 minutes The patient was continuously monitored during the procedure by the interventional radiology nurse under my direct supervision. CONTRAST:  None. COMPLICATIONS: None immediate. PROCEDURE: Informed consent was obtained from the patient following explanation of the procedure, risks, benefits and alternatives. The patient understands, agrees and consents for the procedure. All questions were addressed. A time out was performed. Maximal barrier sterile technique utilized including caps, mask, sterile gowns, sterile gloves, large sterile drape, hand hygiene, and 2% chlorhexidine scrub. Under sterile conditions and local anesthesia, right internal jugular micropuncture venous access was performed with ultrasound. Images were obtained for documentation. A guide wire was inserted followed by a transitional dilator. Next, a 0.035 guidewire was advanced into the IVC with a 5-French catheter. Measurements were obtained from the right venotomy site to the proximal right atrium. In the right infraclavicular chest, a subcutaneous tunnel was created under sterile conditions and local anesthesia. 1% lidocaine with epinephrine was utilized for this. The 19 cm tip to cuff palindrome catheter was tunneled subcutaneously to the venotomy site and inserted into the SVC/RA junction through a valved peel-away sheath. Position was confirmed with fluoroscopy. Images were obtained for documentation. Blood was aspirated from the catheter followed by saline and  heparin flushes. The appropriate volume and strength of heparin was instilled in each lumen. Caps were applied. The catheter was secured at the tunnel site with Gelfoam and a pursestring suture. The venotomy site was closed with subcuticular Vicryl suture. Dermabond was applied to the small right neck incision. A dry sterile dressing was applied. The catheter is ready for use. No immediate complications. IMPRESSION: Ultrasound and fluoroscopically guided right internal jugular tunneled hemodialysis catheter (19 cm tip to cuff palindrome catheter). Electronically Signed   By: Jerilynn Mages.  Shick M.D.   On: 08/29/2018 09:47   Ir Dialy Shunt Intro Needle/intracath Initial W/img Left  Result Date: 08/29/2018 INDICATION: End-stage renal disease, occluded left upper arm AV fistula EXAM: Left AV fistulogram (limited) MEDICATIONS: None. ANESTHESIA/SEDATION:  Moderate Sedation Time:  None. The patient was continuously monitored during the procedure by the interventional radiology nurse under my direct supervision. FLUOROSCOPY TIME:  Fluoroscopy Time: 1 minutes 0 seconds (2 mGy). COMPLICATIONS: None immediate. PROCEDURE: Informed written consent was obtained from the patient after a thorough discussion of the procedural risks, benefits and alternatives. All questions were addressed. Maximal Sterile Barrier Technique was utilized including caps, mask, sterile gowns, sterile gloves, sterile drape, hand hygiene and skin antiseptic. A timeout was performed prior to the initiation of the procedure. Under sterile conditions, Angiocath access performed of the left upper arm AV fistula. Limited contrast injection for fistulagram performed. Left upper arm fistulagram: This demonstrates near complete thrombotic occlusion of the left upper arm brachiocephalic fistula. 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 By ultrasound, there is a large amount of thrombus burden throughout the entire  left upper arm aneurysmal fistula extending to the axilla. Therefore, any aggressive intervention would be low yield for long term durability and also high risk for pulmonary embolism. 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 Large degree of thrombus burden throughout the fistula. See above comment. ACCESS: Because of the above findings, a tunneled right IJ dialysis catheter will be inserted. These results were called by telephone at the time of interpretation on 08/29/2018 at 9:51 am to Dr. Roney Jaffe , who verbally acknowledged these results. Electronically Signed   By: Jerilynn Mages.  Shick M.D.   On: 08/29/2018 09:51        Scheduled Meds: . amLODipine  10 mg Oral QPM  . calcium acetate  1,334 mg Oral TID WC  . Chlorhexidine Gluconate Cloth  6 each Topical Q0600  . Chlorhexidine Gluconate Cloth  6 each Topical Q0600  . Chlorhexidine Gluconate Cloth  6 each Topical Q0600  . cloNIDine  0.2 mg Transdermal Q Wed  . [START ON 08/30/2018] famotidine  20 mg Oral Daily  . fentaNYL      . gelatin adsorbable      . heparin      . isosorbide mononitrate  60 mg Oral Daily  . lamoTRIgine  50 mg Oral Daily  . latanoprost  1 drop Both Eyes QHS  . lidocaine-EPINEPHrine      . LORazepam  0.5 mg Oral QHS  . losartan  50 mg Oral QPM  . midazolam      . multivitamin  1 tablet Oral QHS  . sevelamer carbonate  1,600 mg Oral TID WC  . simethicone  120 mg Oral TID   Continuous Infusions: . ceFAZolin    . piperacillin-tazobactam (ZOSYN)  IV 3.375 g (08/29/18 0307)     LOS: 3 days    Time spent: 69mn    PDomenic Polite MD [facility] Page via [email or web address], password [email or web address] After 7PM please contact night-coverage  08/29/2018, 1:33 PM

## 2018-08-29 NOTE — Progress Notes (Signed)
Subjective:    Antibiotics:  Anti-infectives (From admission, onward)   Start     Dose/Rate Route Frequency Ordered Stop   08/29/18 0901  ceFAZolin (ANCEF) powder       As needed 08/29/18 0902 08/29/18 0901   08/29/18 0855  ceFAZolin (ANCEF) 2-4 GM/100ML-% IVPB    Note to Pharmacy:  Gar Ponto   : cabinet override      08/29/18 0855 08/29/18 2059   08/28/18 1045  piperacillin-tazobactam (ZOSYN) IVPB 3.375 g     3.375 g 12.5 mL/hr over 240 Minutes Intravenous Every 12 hours 08/28/18 1037     08/26/18 0600  meropenem (MERREM) 500 mg in sodium chloride 0.9 % 100 mL IVPB  Status:  Discontinued     500 mg 200 mL/hr over 30 Minutes Intravenous Every 24 hours 08/26/18 0412 08/28/18 1037   08/26/18 0230  meropenem (MERREM) IVPB  Status:  Discontinued    Note to Pharmacy:  Indication:  Achromobacter Bacteremia Last Day of Therapy:  08/29/18 Labs - Once weekly:  CBC/D and BMP, Labs - Every other week:  ESR and CRP     500 mg Intravenous Every 24 hours 08/26/18 0228 08/26/18 0408      Medications: Scheduled Meds: . amLODipine  10 mg Oral QPM  . calcium acetate  1,334 mg Oral TID WC  . Chlorhexidine Gluconate Cloth  6 each Topical Q0600  . Chlorhexidine Gluconate Cloth  6 each Topical Q0600  . Chlorhexidine Gluconate Cloth  6 each Topical Q0600  . cloNIDine  0.2 mg Transdermal Q Wed  . [START ON 08/30/2018] famotidine  20 mg Oral Daily  . fentaNYL      . gelatin adsorbable      . heparin      . isosorbide mononitrate  60 mg Oral Daily  . lamoTRIgine  50 mg Oral Daily  . latanoprost  1 drop Both Eyes QHS  . lidocaine-EPINEPHrine      . LORazepam  0.5 mg Oral QHS  . losartan  50 mg Oral QPM  . midazolam      . multivitamin  1 tablet Oral QHS  . sevelamer carbonate  1,600 mg Oral TID WC  . simethicone  120 mg Oral TID   Continuous Infusions: . ceFAZolin    . piperacillin-tazobactam (ZOSYN)  IV 3.375 g (08/29/18 0307)   PRN Meds:.acetaminophen **OR**  acetaminophen, bisacodyl, hydrALAZINE, ondansetron **OR** ondansetron (ZOFRAN) IV, RESOURCE THICKENUP CLEAR, saccharomyces boulardii, senna-docusate    Objective: Weight change: 1.3 kg  Intake/Output Summary (Last 24 hours) at 08/29/2018 1528 Last data filed at 08/29/2018 0800 Gross per 24 hour  Intake 249.4 ml  Output -  Net 249.4 ml   Blood pressure (!) 155/89, pulse (!) 50, temperature 97.9 F (36.6 C), temperature source Oral, resp. rate 18, height '5\' 3"'  (1.6 m), weight 62.8 kg, SpO2 100 %. Temp:  [97.9 F (36.6 C)-100.1 F (37.8 C)] 97.9 F (36.6 C) (11/29 1244) Pulse Rate:  [45-103] 50 (11/29 1244) Resp:  [9-21] 18 (11/29 1244) BP: (133-168)/(78-95) 155/89 (11/29 1244) SpO2:  [98 %-100 %] 100 % (11/29 1244) Weight:  [62.8 kg] 62.8 kg (11/29 0500)  Physical Exam: General: Alert and awake, oriented x3,  Showed me where she had her central line placed in her neck for dialysis CV bradycardic Pulmonary no respiratory distress Neuro nonfocal  CBC:    BMET Recent Labs    08/28/18 0850 08/29/18 1106  NA 118* 127*  K 5.2*  4.2  CL 85* 95*  CO2 20* 24  GLUCOSE 82 69*  BUN 56* 24*  CREATININE 11.47* 7.50*  CALCIUM 8.1* 8.3*     Liver Panel  Recent Labs    08/28/18 0409  PROT 7.4  ALBUMIN 2.3*  AST 15  ALT 7  ALKPHOS 42  BILITOT 0.7       Sedimentation Rate No results for input(s): ESRSEDRATE in the last 72 hours. C-Reactive Protein No results for input(s): CRP in the last 72 hours.  Micro Results: Recent Results (from the past 720 hour(s))  Blood Culture (routine x 2)     Status: Abnormal   Collection Time: 08/12/18  8:25 PM  Result Value Ref Range Status   Specimen Description BLOOD RIGHT ANTECUBITAL  Final   Special Requests   Final    BOTTLES DRAWN AEROBIC AND ANAEROBIC Blood Culture results may not be optimal due to an excessive volume of blood received in culture bottles   Culture  Setup Time   Final    AEROBIC BOTTLE ONLY GRAM  NEGATIVE RODS CRITICAL VALUE NOTED.  VALUE IS CONSISTENT WITH PREVIOUSLY REPORTED AND CALLED VALUE.    Culture (A)  Final    ACHROMOBACTER SPECIES SUSCEPTIBILITIES PERFORMED ON PREVIOUS CULTURE WITHIN THE LAST 5 DAYS. Performed at [facility], [place] [address], [place], [place] [address]    Report Status 08/15/2018 FINAL  Final  Blood Culture (routine x 2)     Status: Abnormal   Collection Time: 08/12/18  8:30 PM  Result Value Ref Range Status   Specimen Description BLOOD RIGHT HAND  Final   Special Requests   Final    BOTTLES DRAWN AEROBIC AND ANAEROBIC Blood Culture adequate volume   Culture  Setup Time   Final    AEROBIC BOTTLE ONLY GRAM NEGATIVE RODS CRITICAL RESULT CALLED TO, READ BACK BY AND VERIFIED WITH: Hughie Closs [facility] 08/13/18 2103 JDW Performed at [facility], [place] [address], [place], [place] [address]    Culture ACHROMOBACTER SPECIES (A)  Final   Report Status 08/15/2018 FINAL  Final   Organism ID, Bacteria ACHROMOBACTER SPECIES  Final      Susceptibility   Achromobacter species - MIC*    CEFEPIME 16 INTERMEDIATE Intermediate     CEFAZOLIN >=64 RESISTANT Resistant     GENTAMICIN >=16 RESISTANT Resistant     CIPROFLOXACIN >=4 RESISTANT Resistant     IMIPENEM 4 SENSITIVE Sensitive     TRIMETH/SULFA <=20 SENSITIVE Sensitive     * ACHROMOBACTER SPECIES  Blood Culture ID Panel (Reflexed)     Status: None   Collection Time: 08/12/18  8:30 PM  Result Value Ref Range Status   Enterococcus species NOT DETECTED NOT DETECTED Final   Listeria monocytogenes NOT DETECTED NOT DETECTED Final   Staphylococcus species NOT DETECTED NOT DETECTED Final    Comment: CRITICAL RESULT CALLED TO, READ BACK BY AND VERIFIED WITH: Hughie Closs [facility] 08/13/18 2103 JDW    Staphylococcus aureus (BCID) NOT DETECTED NOT DETECTED Final   Streptococcus species NOT DETECTED NOT DETECTED Final   Streptococcus agalactiae NOT DETECTED NOT DETECTED Final   Streptococcus pneumoniae NOT DETECTED  NOT DETECTED Final   Streptococcus pyogenes NOT DETECTED NOT DETECTED Final   Acinetobacter baumannii NOT DETECTED NOT DETECTED Final   Enterobacteriaceae species NOT DETECTED NOT DETECTED Final   Enterobacter cloacae complex NOT DETECTED NOT DETECTED Final   Escherichia coli NOT DETECTED NOT DETECTED Final   Klebsiella oxytoca NOT DETECTED NOT DETECTED  Final   Klebsiella pneumoniae NOT DETECTED NOT DETECTED Final   Proteus species NOT DETECTED NOT DETECTED Final   Serratia marcescens NOT DETECTED NOT DETECTED Final   Haemophilus influenzae NOT DETECTED NOT DETECTED Final   Neisseria meningitidis NOT DETECTED NOT DETECTED Final   Pseudomonas aeruginosa NOT DETECTED NOT DETECTED Final   Candida albicans NOT DETECTED NOT DETECTED Final   Candida glabrata NOT DETECTED NOT DETECTED Final   Candida krusei NOT DETECTED NOT DETECTED Final   Candida parapsilosis NOT DETECTED NOT DETECTED Final   Candida tropicalis NOT DETECTED NOT DETECTED Final    Comment: Performed at [facility], [place] [address], [place], [place] [address]  MRSA PCR Screening     Status: None   Collection Time: 08/13/18 12:52 AM  Result Value Ref Range Status   MRSA by PCR NEGATIVE NEGATIVE Final    Comment:        The GeneXpert MRSA Assay (FDA approved for NASAL specimens only), is one component of a comprehensive MRSA colonization surveillance program. It is not intended to diagnose MRSA infection nor to guide or monitor treatment for MRSA infections. Performed at [facility], [place] [address] [place], [place] [address]   Culture, blood (routine x 2)     Status: Abnormal   Collection Time: 08/25/18  4:04 PM  Result Value Ref Range Status   Specimen Description BLOOD RIGHT FOREARM  Final   Special Requests   Final    BOTTLES DRAWN AEROBIC ONLY Blood Culture adequate volume   Culture  Setup Time   Final    AEROBIC BOTTLE ONLY GRAM NEGATIVE RODS CRITICAL VALUE NOTED.  VALUE IS CONSISTENT WITH  PREVIOUSLY REPORTED AND CALLED VALUE.    Culture (A)  Final    ACHROMOBACTER SPECIES SUSCEPTIBILITIES PERFORMED ON PREVIOUS CULTURE WITHIN THE LAST 5 DAYS. Performed at [facility], [place] [address] [place], [place] [address]    Report Status 08/29/2018 FINAL  Final  Culture, blood (routine x 2)     Status: Abnormal   Collection Time: 08/25/18  4:09 PM  Result Value Ref Range Status   Specimen Description BLOOD RIGHT ANTECUBITAL  Final   Special Requests   Final    BOTTLES DRAWN AEROBIC AND ANAEROBIC Blood Culture adequate volume   Culture  Setup Time   Final    AEROBIC BOTTLE ONLY GRAM NEGATIVE RODS CRITICAL RESULT CALLED TO, READ BACK BY AND VERIFIED WITH: Diona Browner PHARMD 08/26/18 1825 JDW Performed at [facility], [place] [address], [place], [name] [address]    Culture ACHROMOBACTER SPECIES (A)  Final   Report Status 08/28/2018 FINAL  Final   Organism ID, Bacteria ACHROMOBACTER SPECIES  Final      Susceptibility   Achromobacter species - MIC*    CEFEPIME 16 INTERMEDIATE Intermediate     CEFAZOLIN >=64 RESISTANT Resistant     GENTAMICIN >=16 RESISTANT Resistant     CIPROFLOXACIN >=4 RESISTANT Resistant     IMIPENEM 8 INTERMEDIATE Intermediate     TRIMETH/SULFA 160 RESISTANT Resistant     * ACHROMOBACTER SPECIES  Culture, blood (routine x 2)     Status: None (Preliminary result)   Collection Time: 08/28/18  8:42 AM  Result Value Ref Range Status   Specimen Description BLOOD RIGHT ANTECUBITAL  Final   Special Requests   Final    BOTTLES DRAWN AEROBIC AND ANAEROBIC Blood Culture adequate volume   Culture   Final    NO GROWTH 1  DAY Performed at [facility], [place] [address], [place], [place] [address]    Report Status PENDING  Incomplete  Culture, blood (routine x 2)     Status: None (Preliminary result)   Collection Time: 08/28/18  8:42 AM  Result Value Ref Range Status   Specimen Description BLOOD BLOOD RIGHT FOREARM  Final   Special Requests   Final     BOTTLES DRAWN AEROBIC AND ANAEROBIC Blood Culture adequate volume   Culture   Final    NO GROWTH 1 DAY Performed at [facility], [place] [address] [place], [place] [address]    Report Status PENDING  Incomplete    Studies/Results: Ir Fluoro Guide Cv Line Right  Result Date: 08/29/2018 INDICATION: End-stage renal disease, 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 Daryll Brod, MD Guidance:  Ultrasound and fluoroscopic FLUOROSCOPY TIME:  Fluoroscopy Time: 1 minutes 0 seconds (2 mGy). MEDICATIONS: Ancef 2 g administered within 1 hour of the procedure ANESTHESIA/SEDATION: Versed 0.5 mg IV; Fentanyl 25 mcg IV; Moderate Sedation Time:  17 minutes The patient was continuously monitored during the procedure by the interventional radiology nurse under my direct supervision. CONTRAST:  None. COMPLICATIONS: None immediate. PROCEDURE: Informed consent was obtained from the patient following explanation of the procedure, risks, benefits and alternatives. The patient understands, agrees and consents for the procedure. All questions were addressed. A time out was performed. Maximal barrier sterile technique utilized including caps, mask, sterile gowns, sterile gloves, large sterile drape, hand hygiene, and 2% chlorhexidine scrub. Under sterile conditions and local anesthesia, right internal jugular micropuncture venous access was performed with ultrasound. Images were obtained for documentation. A guide wire was inserted followed by a transitional dilator. Next, a 0.035 guidewire was advanced into the IVC with a 5-French catheter. Measurements were obtained from the right venotomy site to the proximal right atrium. In the right infraclavicular chest, a subcutaneous tunnel was created under sterile conditions and  local anesthesia. 1% lidocaine with epinephrine was utilized for this. The 19 cm tip to cuff palindrome catheter was tunneled subcutaneously to the venotomy site and inserted into the SVC/RA junction through a valved peel-away sheath. Position was confirmed with fluoroscopy. Images were obtained for documentation. Blood was aspirated from the catheter followed by saline and heparin flushes. The appropriate volume and strength of heparin was instilled in each lumen. Caps were applied. The catheter was secured at the tunnel site with Gelfoam and a pursestring suture. The venotomy site was closed with subcuticular Vicryl suture. Dermabond was applied to the small right neck incision. A dry sterile dressing was applied. The catheter is ready for use. No immediate complications. IMPRESSION: Ultrasound and fluoroscopically guided right internal jugular tunneled hemodialysis catheter (19 cm tip to cuff palindrome catheter). Electronically Signed   By: Jerilynn Mages.  Shick M.D.   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 Daryll Brod, MD Guidance:  Ultrasound and fluoroscopic FLUOROSCOPY TIME:  Fluoroscopy Time: 1 minutes 0 seconds (2 mGy). MEDICATIONS: Ancef 2 g administered within 1 hour of the procedure ANESTHESIA/SEDATION: Versed 0.5 mg IV; Fentanyl 25 mcg  IV; Moderate Sedation Time:  17 minutes The patient was continuously monitored during the procedure by the interventional radiology nurse under my direct supervision. CONTRAST:  None. COMPLICATIONS: None immediate. PROCEDURE: Informed consent was obtained from the patient following explanation of the procedure, risks, benefits and alternatives. The patient  understands, agrees and consents for the procedure. All questions were addressed. A time out was performed. Maximal barrier sterile technique utilized including caps, mask, sterile gowns, sterile gloves, large sterile drape, hand hygiene, and 2% chlorhexidine scrub. Under sterile conditions and local anesthesia, right internal jugular micropuncture venous access was performed with ultrasound. Images were obtained for documentation. A guide wire was inserted followed by a transitional dilator. Next, a 0.035 guidewire was advanced into the IVC with a 5-French catheter. Measurements were obtained from the right venotomy site to the proximal right atrium. In the right infraclavicular chest, a subcutaneous tunnel was created under sterile conditions and local anesthesia. 1% lidocaine with epinephrine was utilized for this. The 19 cm tip to cuff palindrome catheter was tunneled subcutaneously to the venotomy site and inserted into the SVC/RA junction through a valved peel-away sheath. Position was confirmed with fluoroscopy. Images were obtained for documentation. Blood was aspirated from the catheter followed by saline and heparin flushes. The appropriate volume and strength of heparin was instilled in each lumen. Caps were applied. The catheter was secured at the tunnel site with Gelfoam and a pursestring suture. The venotomy site was closed with subcuticular Vicryl suture. Dermabond was applied to the small right neck incision. A dry sterile dressing was applied. The catheter is ready for use. No immediate complications. IMPRESSION: Ultrasound and fluoroscopically guided right internal jugular tunneled hemodialysis catheter (19 cm tip to cuff palindrome catheter). Electronically Signed   By: Jerilynn Mages.  Shick M.D.   On: 08/29/2018 09:47   Ir Dialy Shunt Intro Needle/intracath Initial W/img Left  Result Date: 08/29/2018 INDICATION: End-stage renal disease, occluded left upper arm AV fistula EXAM: Left AV fistulogram  (limited) MEDICATIONS: None. ANESTHESIA/SEDATION: Moderate Sedation Time:  None. The patient was continuously monitored during the procedure by the interventional radiology nurse under my direct supervision. FLUOROSCOPY TIME:  Fluoroscopy Time: 1 minutes 0 seconds (2 mGy). COMPLICATIONS: None immediate. PROCEDURE: Informed written consent was obtained from the patient after a thorough discussion of the procedural risks, benefits and alternatives. All questions were addressed. Maximal Sterile Barrier Technique was utilized including caps, mask, sterile gowns, sterile gloves, sterile drape, hand hygiene and skin antiseptic. A timeout was performed prior to the initiation of the procedure. Under sterile conditions, Angiocath access performed of the left upper arm AV fistula. Limited contrast injection for fistulagram performed. Left upper arm fistulagram: This demonstrates near complete thrombotic occlusion of the left upper arm brachiocephalic fistula. 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 By ultrasound, there is a large amount of thrombus burden throughout the entire left upper arm aneurysmal fistula extending to the axilla. Therefore, any aggressive intervention would be low yield for long term durability and also high risk for pulmonary embolism. 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 Large degree of thrombus burden throughout the fistula. See above comment. ACCESS: Because of the above findings, a tunneled right IJ dialysis catheter will be inserted. These results were called by telephone at the time of interpretation on 08/29/2018 at 9:51 am to Dr. Roney Jaffe , who verbally acknowledged these results. Electronically Signed   By: Jerilynn Mages.  Shick M.D.   On: 08/29/2018 09:51      Assessment/Plan:  INTERVAL HISTORY: Her Achromobacter is now resistant to CARBAPENEM's but wass S to send in ceftazidime and  she has been changed to Zosyn   Principal Problem:   Anemia associated with chronic renal failure Active Problems:   Multiple myeloma ([place])   Hypertensive urgency   Hyponatremia   ESRD needing dialysis ([place])   Pressure ulcer, stage I   Neurocognitive deficits   Weakness   Anemia   Hypothermia    Mellina Benison is a 77 y.o. female with current acromial back to her bacteremia now with the organism having developed resistance to CARBAPENEM's.  Per microbiology the organism is found to be susceptible based on lab testing to Zosyn and to ceftazidime though I am a bit worried about the reliability of the latter.  Switch to Zosyn now hopefully the CARBAPENEM therapy she got prior to insertion of her new dialysis lines may have provided some activity to eradicate this organism but I am very worried about it especially given the multidrug nature resistant nature of this organism.  Repeat blood cultures are pending and no growth so far.  Would continue the Zosyn while she is an inpatient  If she becomes ready for discharge would change to ceftaz edema with dialysis  Would complete a total of 2 weeks of antibiotics with day 1 being November 28 and last day of abx being December 11th.  Will sign off for now please call back if her repeat blood cultures turn positive   LOS: 3 days   Alcide Evener 08/29/2018, 3:28 PM

## 2018-08-29 NOTE — Procedures (Signed)
esrd  S/p RT IJ HD CATH  Tip svcra Ready for use Full report in pacs

## 2018-08-29 NOTE — [place] FL2 (Signed)
[place] MEDICAID FL2 LEVEL OF CARE SCREENING TOOL     IDENTIFICATION  Patient Name: [name] Birthdate: 1941-06-16 Sex: female Admission Date (Current Location): 08/25/2018  [place] and [place] Number:  [name] [id number] [place] and Address:  [facility], [place] [address] [place], [place] [address]      Provider Number: [id number]  Attending Physician Name and Address:  Domenic Polite, MD  Relative Name and Phone Number:  DaughterTameah Mihalko [phone](NIDP) [phone](ERXV)    Current Level of Care: Hospital Recommended Level of Care: [place] Prior Approval Number:    Date Approved/Denied:   PASRR Number: [id number]  Discharge Plan: SNF    Current Diagnoses: Patient Active Problem List   Diagnosis Date Noted  . Anemia associated with chronic renal failure 08/26/2018  . Anemia 08/26/2018  . Hypothermia 08/26/2018  . Weakness 08/25/2018  . Bacteremia   . Acute lower UTI 08/12/2018  . Swelling of joint of left wrist 08/12/2018  . Rectal bleeding 08/12/2018  . Neurocognitive deficits 06/05/2018  . Altered mental status 05/28/2018  . Macrocytosis 05/01/2018  . Hypertension, uncontrolled 04/16/2018  . ESRD needing dialysis ([place]) 04/16/2018  . Encephalopathy acute 04/16/2018  . Anxiety and depression 04/16/2018  . Hypertension, accelerated 04/16/2018  . Pressure ulcer, stage I 04/16/2018  . Abnormal chest x-ray 12/26/2017  . HCAP (healthcare-associated pneumonia) 04/24/2017  . Sepsis ([place]) 04/24/2017  . Nausea and vomiting 04/24/2017  . Thrombocytopenia ([place]) 04/24/2017  . Hyperkalemia 01/16/2017  . IBS (irritable bowel syndrome) 12/27/2016  . Mood disorder ([place]) 07/19/2016  . Diastolic dysfunction 09/32/6712  . Insomnia 04/27/2016  . Hyponatremia 03/12/2016  . Diarrhea 03/12/2016  . Hypertensive urgency 02/18/2016  . Swelling of joint of left knee 02/18/2016  . Leukopenia due to antineoplastic  chemotherapy ([place]) 09/03/2015  . Malnutrition of moderate degree 09/02/2015  . GERD (gastroesophageal reflux disease) 04/23/2015  . 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 Multiple myeloma ([place]) 03/11/2014  . Hypertension associatd with end stage renal disease on dialysis 03/11/2014    Orientation RESPIRATION BLADDER Height & Weight     Self, Place, Time  Normal Continent Weight: 138 lb 7.2 oz (62.8 kg) Height:  '5\' 3"'  (160 cm)  BEHAVIORAL SYMPTOMS/MOOD NEUROLOGICAL BOWEL NUTRITION STATUS      Incontinent Diet(renal with fluid restriction Fluid restriction: 1200 mL Fluid)  AMBULATORY STATUS COMMUNICATION OF NEEDS Skin   Limited Assist Verbally Normal                       Personal Care Assistance Level of Assistance  Bathing, Feeding, Dressing Bathing Assistance: Limited assistance Feeding assistance: Limited assistance Dressing Assistance: Limited assistance     Functional Limitations Info  Sight, Hearing, Speech Sight Info: Adequate Hearing Info: Adequate Speech Info: Impaired    SPECIAL CARE FACTORS FREQUENCY  PT (By licensed PT), OT (By licensed OT)                    Contractures Contractures Info: Not present    Additional Factors Info  Code Status, Allergies Code Status Info: Full Code Allergies Info: Phenergan Promethazine Hcl           Current Medications (08/29/2018):  This is the current hospital active medication list Current Facility-Administered Medications  Medication Dose Route Frequency Provider Last Rate Last Dose  . acetaminophen (TYLENOL) tablet 650 mg  650 mg Oral Q6H PRN Rise Patience, MD   650 mg at 08/28/18 2220  Or  . acetaminophen (TYLENOL) suppository 650 mg  650 mg Rectal Q6H PRN Rise Patience, MD      . amLODipine (NORVASC) tablet 10 mg  10 mg Oral QPM Rise Patience, MD   10 mg at 08/28/18 1706  . bisacodyl (DULCOLAX) suppository 10 mg  10 mg Rectal Daily PRN Rise Patience, MD      . calcium  acetate (PHOSLO) capsule 1,334 mg  1,334 mg Oral TID WC Rise Patience, MD   1,334 mg at 08/29/18 1145  . ceFAZolin (ANCEF) 2-4 GM/100ML-% IVPB           . Chlorhexidine Gluconate Cloth 2 % PADS 6 each  6 each Topical Q0600 Roney Jaffe, MD      . Chlorhexidine Gluconate Cloth 2 % PADS 6 each  6 each Topical Q0600 Roney Jaffe, MD   6 each at 08/28/18 0557  . Chlorhexidine Gluconate Cloth 2 % PADS 6 each  6 each Topical Q0600 Roney Jaffe, MD      . cloNIDine (CATAPRES - Dosed in mg/24 hr) patch 0.2 mg  0.2 mg Transdermal Q Wed Rise Patience, MD   0.2 mg at 08/27/18 0831  . [START ON 08/30/2018] famotidine (PEPCID) tablet 20 mg  20 mg Oral Daily Domenic Polite, MD      . fentaNYL (SUBLIMAZE) 100 MCG/2ML injection           . gelatin adsorbable (GELFOAM/SURGIFOAM) 12-7 MM sponge 12-7 mm           . heparin 1000 UNIT/ML injection           . hydrALAZINE (APRESOLINE) injection 5 mg  5 mg Intravenous Q4H PRN Rise Patience, MD   5 mg at 08/26/18 0320  . isosorbide mononitrate (IMDUR) 24 hr tablet 60 mg  60 mg Oral Daily Rise Patience, MD   60 mg at 08/29/18 1146  . lamoTRIgine (LAMICTAL) tablet 50 mg  50 mg Oral Daily Rise Patience, MD   50 mg at 08/29/18 1145  . latanoprost (XALATAN) 0.005 % ophthalmic solution 1 drop  1 drop Both Eyes QHS Rise Patience, MD   1 drop at 08/27/18 2156  . lidocaine-EPINEPHrine (XYLOCAINE-EPINEPHrine) 1 %-1:200000 (PF) injection           . LORazepam (ATIVAN) tablet 0.5 mg  0.5 mg Oral QHS Vann, Jessica U, DO   0.5 mg at 08/28/18 2152  . losartan (COZAAR) tablet 50 mg  50 mg Oral QPM Vann, Jessica U, DO   50 mg at 08/28/18 1706  . midazolam (VERSED) 2 MG/2ML injection           . multivitamin (RENA-VIT) tablet 1 tablet  1 tablet Oral QHS Rise Patience, MD   1 tablet at 08/28/18 2152  . ondansetron (ZOFRAN) tablet 4 mg  4 mg Oral Q6H PRN Rise Patience, MD       Or  . ondansetron [facility]) injection 4 mg  4  mg Intravenous Q6H PRN Rise Patience, MD      . piperacillin-tazobactam (ZOSYN) IVPB 3.375 g  3.375 g Intravenous Q12H Tommy Medal, Lavell Islam, MD 12.5 mL/hr at 08/29/18 0307 3.375 g at 08/29/18 0307  . RESOURCE THICKENUP CLEAR   Oral PRN Geradine Girt, DO      . saccharomyces boulardii (FLORASTOR) capsule 250 mg  250 mg Oral BID PRN Rise Patience, MD      . senna-docusate (Senokot-S) tablet 2 tablet  2 tablet Oral QHS PRN Rise Patience, MD      . sevelamer carbonate (RENVELA) tablet 1,600 mg  1,600 mg Oral TID WC Rise Patience, MD   1,600 mg at 08/29/18 1146  . simethicone (MYLICON) chewable tablet 120 mg  120 mg Oral TID Rise Patience, MD   120 mg at 08/29/18 1144     Discharge Medications: Please see discharge summary for a list of discharge medications.  Relevant Imaging Results:  Relevant Lab Results:   Additional Information SSN: [id number] ; Dialysis patient- TTS [place], LCSW

## 2018-08-30 ENCOUNTER — Telehealth: Payer: Self-pay

## 2018-08-30 LAB — RENAL FUNCTION PANEL
Albumin: 2.1 g/dL — ABNORMAL LOW (ref 3.5–5.0)
Anion gap: 11 (ref 5–15)
BUN: 31 mg/dL — ABNORMAL HIGH (ref 8–23)
CO2: 25 mmol/L (ref 22–32)
Calcium: 8.4 mg/dL — ABNORMAL LOW (ref 8.9–10.3)
Chloride: 89 mmol/L — ABNORMAL LOW (ref 98–111)
Creatinine, Ser: 8.42 mg/dL — ABNORMAL HIGH (ref 0.44–1.00)
GFR calc Af Amer: 5 mL/min — ABNORMAL LOW (ref >60)
GFR, EST NON AFRICAN AMERICAN: 4 mL/min — AB (ref >60)
Glucose, Bld: 115 mg/dL — ABNORMAL HIGH (ref 70–99)
POTASSIUM: 4.4 mmol/L (ref 3.5–5.1)
Phosphorus: 5.4 mg/dL — ABNORMAL HIGH (ref 2.5–4.6)
Sodium: 125 mmol/L — ABNORMAL LOW (ref 135–145)

## 2018-08-30 LAB — CBC
HCT: 27 % — ABNORMAL LOW (ref 36.0–46.0)
Hemoglobin: 8.4 g/dL — ABNORMAL LOW (ref 12.0–15.0)
MCH: 30.5 pg (ref 26.0–34.0)
MCHC: 31.1 g/dL (ref 30.0–36.0)
MCV: 98.2 fL (ref 80.0–100.0)
Platelets: 169 10*3/uL (ref 150–400)
RBC: 2.75 MIL/uL — ABNORMAL LOW (ref 3.87–5.11)
RDW: 17.4 % — ABNORMAL HIGH (ref 11.5–15.5)
WBC: 3.3 10*3/uL — AB (ref 4.0–10.5)
nRBC: 0 % (ref 0.0–0.2)

## 2018-08-30 MED ORDER — SIMETHICONE 80 MG PO CHEW: 80.0000 mg | CHEWABLE_TABLET | Freq: Four times a day (QID) | ORAL | Status: DC | PRN

## 2018-08-30 MED ORDER — POLYETHYLENE GLYCOL 3350 17 G PO PACK
17.0000 g | PACK | Freq: Every day | ORAL | Status: DC | PRN
  Administered 2018-08-30 – 2018-08-31 (×2): 17 g via ORAL
  Filled 2018-08-30 (×2): qty 1

## 2018-08-30 NOTE — Telephone Encounter (Signed)
+   BC from ED 08/25/18  Admitted on [place] at [facility]

## 2018-08-30 NOTE — Progress Notes (Addendum)
[facility] Progress Note  Subjective: Na+ 125 today, K 4.4, creat down 8.42.  On HD now using new TDC.    Vitals:   08/30/18 1000 08/30/18 1030 08/30/18 1100 08/30/18 1130  BP: 131/77 (!) 161/91 (!) 151/95 (!) 149/91  Pulse: (!) 50 (!) 50 (!) 56 (!) 51  Resp:    18  Temp:    98 F (36.7 C)  TempSrc:    Oral  SpO2:    100%  Weight:    59.3 kg  Height:        Inpatient medications: . amLODipine  10 mg Oral QPM  . calcium acetate  1,334 mg Oral TID WC  . Chlorhexidine Gluconate Cloth  6 each Topical Q0600  . cloNIDine  0.2 mg Transdermal Q Wed  . famotidine  20 mg Oral Daily  . isosorbide mononitrate  60 mg Oral Daily  . lamoTRIgine  50 mg Oral Daily  . latanoprost  1 drop Both Eyes QHS  . LORazepam  0.5 mg Oral QHS  . losartan  50 mg Oral QPM  . multivitamin  1 tablet Oral QHS  . sevelamer carbonate  1,600 mg Oral TID WC  . simethicone  120 mg Oral TID   . piperacillin-tazobactam (ZOSYN)  IV 3.375 g (08/30/18 0220)   acetaminophen **OR** acetaminophen, bisacodyl, hydrALAZINE, ondansetron **OR** ondansetron (ZOFRAN) IV, polyethylene glycol, RESOURCE THICKENUP CLEAR, saccharomyces boulardii, senna-docusate, simethicone  Iron/TIBC/Ferritin/ %Sat    Component Value Date/Time   IRON 149 (H) 03/13/2014 0442   TIBC NOT CALC 03/13/2014 0442   FERRITIN 2,786 (H) 03/13/2014 0442   IRONPCTSAT NOT CALC 03/13/2014 0442    Exam: Gen looks better, much better No jvd or bruits Chest clear bilat to bases RRR no MRG Abd soft ntnd no mass or ascites +bs Ext 1+ bilat LE edema Neuro is alert, Ox 2, nonfocal LUA AVF no bruit    Home meds:  - clonidine patch 0.2 qwk/ clonidine 0.1 tid prn/ isosorbide mononitrate 60 qd/ amlodipine 10  - sevelamer carbonate 1.6gm ac tid/ calcium acetate 1334 ac  - lamotrigine 50 qd  - ranitidine 300 hs/ prns/ vitamins/ eyedrops  Dialysis: TTS   3.5h   425/A1.5  58kg  2/2.25  Hep none   LUA AVF - hectorol 3 ug  - epo alfa  18000 tiw IV  - goes to all HD sessions but signs off early frequently    CXR 11/25 > clear, no CHF     Impression/ Plan: 1. Achromobacter sepsis - recurrent, had same bug grow last month.  Per ID changed to IV zosyn for now, then [facility] when dc'd to be given at OP HD unit through Dec 11th.   2. AMS - delirium. Improving. Prob underlying dementia.  3. ESRD - HD TTS. HD this am.  4. HD access - AVF L arm clotted, large aneurysmal AVF, not able to salvage.  Will need new access when more stable and after bacteremia is treated. Using new [facility] for now.  5. Volume excess - improved w HD 6. MBD ckd - cont meds 7. Anemia ckd - cont esa prn, Hb down 8.9 8. HTN - cont meds x 3 + lower vol  9. Dispo - after HD today should be metabolically ready for dc    Kelly Splinter MD [place] pager [phone]   08/30/2018, 12:39 PM   Recent Labs  Lab 08/25/18 1607  08/28/18 0409  08/29/18 1106 08/30/18 0806  NA 123*   < >  116*   < > 127* 125*  K 4.3   < > 5.3*   < > 4.2 4.4  CL 86*   < > 84*   < > 95* 89*  CO2 23   < > 20*   < > 24 25  GLUCOSE 104*   < > 71   < > 69* 115*  BUN 45*   < > 55*   < > 24* 31*  CREATININE 9.21*   < > 11.54*   < > 7.50* 8.42*  CALCIUM 8.5*   < > 8.2*   < > 8.3* 8.4*  PHOS  --   --   --   --   --  5.4*  ALBUMIN 2.7*  --  2.3*  --   --  2.1*  INR 1.09  --   --   --   --   --    < > = values in this interval not displayed.   Recent Labs  Lab 08/25/18 1607 08/28/18 0409  AST 16 15  ALT 8 7  ALKPHOS 44 42  BILITOT 0.8 0.7  PROT 8.2* 7.4   Recent Labs  Lab 08/25/18 1607 08/25/18 2301  08/29/18 0420 08/30/18 0806  WBC 3.9* 3.7*   < > 2.7* 3.3*  NEUTROABS 2.9 2.6  --   --   --   HGB 7.1* 7.1*   < > 8.7* 8.4*  HCT 23.6* 23.3*   < > 28.6* 27.0*  MCV 100.4* 99.6   < > 94.4 98.2  PLT 234 228   < > 199 169   < > = values in this interval not displayed.

## 2018-08-30 NOTE — Progress Notes (Signed)
PROGRESS NOTE    [name]  UEA:[id number] DOB: Nov 25, 1940 DOA: 08/25/2018 PCP: Hendricks Limes, MD  Brief Narrative: [name] is a 77 year old female with history of ESRD on hemodialysis, mild dementia with cognitive deficits and dysphagia, multiple myeloma on chemotherapy, chronic anemia recently hospitalized with sepsis, gram-negative bacteremia with Achromobacter species, she was treated with IV meropenem, source was not found, discharged to skilled nursing facility to complete a 2-week course of IV meropenem on 11/29. 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 -Left arm AV fistula site without overt signs or symptoms of infection -Achromobacter from 11/25 is multidrug-resistant, IV meropenem was switched to Zosyn 11/28 -appreciate infectious disease input  -Repeat blood cultures from 11/28- so far  -2D echocardiogram -did not show any evidence of vegetations -2-week course of IV antibiotics recommended, last day is 12/11, will be transitioned to ceftazidime with dialysis to complete course  Anemia -Admitted with hemoglobin of 7.1 this is multifactorial, secondary to chronic kidney disease, multiple myeloma, chemotherapy and some low-grade GI bleed as well, was Hemoccult positive in the emergency room -transfused 2 units of PRBC on 11/27 -Hemoglobin stable now -In the setting  of recurrent gram-negative bacteremia with unclear source and mild GI bleed, will need endoscopic evaluation, with patient being a SNF resident, cognitive deficits and dialysis, quite complex to get this scheduled as outpatient, discussed with [facility] gastroenterology, greatly appreciate input for work-up/colonoscopy  ESRD on hemodialysis -Nephrology following, significant difficulties with dialysis access and cannulating left arm AV fistula  -s/p temporary dialysis catheter in IR  -Dialysis today per renal  Mild dementia with cognitive dysfunction -CT head unremarkable  History of multiple myeloma -Refractory, followed at [facility] -Had been on carfilzomib and dexamethasone per oncology -Valacyclovir for prophylaxis  DVT prophylaxis: SCds Code Status: Full Code Family Communication: no family at bedside Disposition Plan: SNF when workup complete  Consultants:   Nephrology  ID  Equal GI   Procedures:   Antimicrobials:    Subjective: -In dialysis, no complaints today, -No overt bleeding noted  Objective: Vitals:   08/30/18 1000 08/30/18 1030 08/30/18 1100 08/30/18 1130  BP: 131/77 (!) 161/91 (!) 151/95 (!) 149/91  Pulse: (!) 50 (!) 50 (!) 56 (!) 51  Resp:    18  Temp:    98 F (36.7 C)  TempSrc:    Oral  SpO2:    100%  Weight:    59.3 kg  Height:        Intake/Output Summary (Last 24 hours) at 08/30/2018 1419 Last data filed at 08/30/2018 1130 Gross per 24 hour  Intake 222 ml  Output 2058 ml  Net -1836 ml   Filed Weights   08/30/18 0500 08/30/18 0744 08/30/18 1130  Weight: 62.7 kg 61.3 kg 59.3 kg    Examination:  Gen: Early, chronically ill-appearing female, laying in bed, mild cognitive deficits, oriented to self and place only HEENT: Right IJ HD catheter Lungs: Clear bilaterally CVS: RRR,No Gallops,Rubs or new Murmurs Abd: soft, Non  tender, non distended, BS present Extremities: Left arm AV fistula Skin: no new rashes  Data Reviewed:   CBC: Recent  Labs  Lab 08/25/18 1607 08/25/18 2301  08/27/18 1254 08/27/18 1602 08/28/18 0409 08/29/18 0420 08/30/18 0806  WBC 3.9* 3.7*   < > 4.2 3.6* 4.0 2.7* 3.3*  NEUTROABS 2.9 2.6  --   --   --   --   --   --   HGB 7.1* 7.1*   < > 9.2* 9.0* 8.9* 8.7* 8.4*  HCT 23.6* 23.3*   < > 29.0* 27.4* 27.5* 28.6* 27.0*  MCV 100.4* 99.6   < > 92.7 92.6 92.0 94.4 98.2  PLT 234 228   < > 240 230 228 199 169   < > = values in this interval not displayed.   Basic Metabolic Panel: Recent Labs  Lab 08/27/18 2325 08/28/18 0409 08/28/18 0850 08/29/18 1106 08/30/18 0806  NA 117* 116* 118* 127* 125*  K 5.4* 5.3* 5.2* 4.2 4.4  CL 85* 84* 85* 95* 89*  CO2 19* 20* 20* 24 25  GLUCOSE 72 71 82 69* 115*  BUN 54* 55* 56* 24* 31*  CREATININE 10.79* 11.54* 11.47* 7.50* 8.42*  CALCIUM 8.2* 8.2* 8.1* 8.3* 8.4*  PHOS  --   --   --   --  5.4*   GFR: Estimated Creatinine Clearance: 4.5 mL/min (A) (by C-G formula based on SCr of 8.42 mg/dL (H)). Liver Function Tests: Recent Labs  Lab 08/25/18 1607 08/28/18 0409 08/30/18 0806  AST 16 15  --   ALT 8 7  --   ALKPHOS 44 42  --   BILITOT 0.8 0.7  --   PROT 8.2* 7.4  --   ALBUMIN 2.7* 2.3* 2.1*   No results for input(s): LIPASE, AMYLASE in the last 168 hours. No results for input(s): AMMONIA in the last 168 hours. Coagulation Profile: Recent Labs  Lab 08/25/18 1607  INR 1.09   Cardiac Enzymes: No results for input(s): CKTOTAL, CKMB, CKMBINDEX, TROPONINI in the last 168 hours. BNP (last 3 results) No results for input(s): PROBNP in the last 8760 hours. HbA1C: No results for input(s): HGBA1C in the last 72 hours. CBG: No results for input(s): GLUCAP in the last 168 hours. Lipid Profile: No results for input(s): CHOL, HDL, LDLCALC, TRIG, CHOLHDL, LDLDIRECT in the last 72 hours. Thyroid Function Tests: No results for input(s): TSH, T4TOTAL, FREET4, T3FREE, THYROIDAB in the last 72 hours. Anemia Panel: Recent Labs    08/27/18 1602  VITAMINB12 696    Urine analysis:    Component Value Date/Time   COLORURINE YELLOW 08/12/2018 2025   APPEARANCEUR TURBID (A) 08/12/2018 2025   LABSPEC 1.010 08/12/2018 2025   PHURINE 8.0 08/12/2018 2025   GLUCOSEU 50 (A) 08/12/2018 2025   HGBUR MODERATE (A) 08/12/2018 2025   BILIRUBINUR NEGATIVE 08/12/2018 2025   KETONESUR NEGATIVE 08/12/2018 2025   PROTEINUR 100 (A) 08/12/2018 2025   UROBILINOGEN 0.2 04/10/2015 1800   NITRITE NEGATIVE 08/12/2018 2025   LEUKOCYTESUR LARGE (A) 08/12/2018 2025   Sepsis Labs: '@LABRCNTIP' (procalcitonin:4,lacticidven:4)  ) Recent Results (from the past 240 hour(s))  Culture, blood (routine x 2)     Status: Abnormal   Collection Time: 08/25/18  4:04 PM  Result Value Ref Range Status   Specimen Description BLOOD RIGHT FOREARM  Final   Special Requests   Final    BOTTLES DRAWN AEROBIC ONLY Blood Culture adequate volume   Culture  Setup Time   Final  AEROBIC BOTTLE ONLY GRAM NEGATIVE RODS CRITICAL VALUE NOTED.  VALUE IS CONSISTENT WITH PREVIOUSLY REPORTED AND CALLED VALUE.    Culture (A)  Final    ACHROMOBACTER SPECIES SUSCEPTIBILITIES PERFORMED ON PREVIOUS CULTURE WITHIN THE LAST 5 DAYS. Performed at [facility], [place] [address] [place], [place] [address]    Report Status 08/29/2018 FINAL  Final  Culture, blood (routine x 2)     Status: Abnormal   Collection Time: 08/25/18  4:09 PM  Result Value Ref Range Status   Specimen Description BLOOD RIGHT ANTECUBITAL  Final   Special Requests   Final    BOTTLES DRAWN AEROBIC AND ANAEROBIC Blood Culture adequate volume   Culture  Setup Time   Final    AEROBIC BOTTLE ONLY GRAM NEGATIVE RODS CRITICAL RESULT CALLED TO, READ BACK BY AND VERIFIED WITH: Diona Browner PHARMD 08/26/18 1825 JDW Performed at [facility], [place] [address], [place], [place] [address]    Culture ACHROMOBACTER SPECIES (A)  Final   Report Status 08/28/2018 FINAL  Final   Organism ID, Bacteria ACHROMOBACTER SPECIES  Final       Susceptibility   Achromobacter species - MIC*    CEFEPIME 16 INTERMEDIATE Intermediate     CEFAZOLIN >=64 RESISTANT Resistant     GENTAMICIN >=16 RESISTANT Resistant     CIPROFLOXACIN >=4 RESISTANT Resistant     IMIPENEM 8 INTERMEDIATE Intermediate     TRIMETH/SULFA 160 RESISTANT Resistant     * ACHROMOBACTER SPECIES  Culture, blood (routine x 2)     Status: None (Preliminary result)   Collection Time: 08/28/18  8:42 AM  Result Value Ref Range Status   Specimen Description BLOOD RIGHT ANTECUBITAL  Final   Special Requests   Final    BOTTLES DRAWN AEROBIC AND ANAEROBIC Blood Culture adequate volume   Culture   Final    NO GROWTH 2 DAYS Performed at [facility], [place] [address], [place], [place] [address]    Report Status PENDING  Incomplete  Culture, blood (routine x 2)     Status: None (Preliminary result)   Collection Time: 08/28/18  8:42 AM  Result Value Ref Range Status   Specimen Description BLOOD BLOOD RIGHT FOREARM  Final   Special Requests   Final    BOTTLES DRAWN AEROBIC AND ANAEROBIC Blood Culture adequate volume   Culture   Final    NO GROWTH 2 DAYS Performed at [facility], [place] [address], [place],  [address]    Report Status PENDING  Incomplete         Radiology Studies: Ir Fluoro Guide Cv Line Right  Result Date: 08/29/2018 INDICATION: End-stage renal disease, 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 Daryll Brod, MD Guidance:  Ultrasound and fluoroscopic FLUOROSCOPY TIME:  Fluoroscopy Time: 1 minutes 0 seconds (2 mGy). MEDICATIONS: Ancef 2 g administered within 1 hour of the procedure ANESTHESIA/SEDATION: Versed 0.5 mg IV; Fentanyl 25 mcg IV; Moderate Sedation Time:  17 minutes The patient was continuously monitored during the procedure by the  interventional radiology nurse under my direct supervision. CONTRAST:  None. COMPLICATIONS: None immediate. PROCEDURE: Informed consent was obtained from the patient following explanation of the procedure, risks, benefits and alternatives. The patient understands, agrees and consents for the procedure. All questions were addressed. A time out was performed. Maximal  barrier sterile technique utilized including caps, mask, sterile gowns, sterile gloves, large sterile drape, hand hygiene, and 2% chlorhexidine scrub. Under sterile conditions and local anesthesia, right internal jugular micropuncture venous access was performed with ultrasound. Images were obtained for documentation. A guide wire was inserted followed by a transitional dilator. Next, a 0.035 guidewire was advanced into the IVC with a 5-French catheter. Measurements were obtained from the right venotomy site to the proximal right atrium. In the right infraclavicular chest, a subcutaneous tunnel was created under sterile conditions and local anesthesia. 1% lidocaine with epinephrine was utilized for this. The 19 cm tip to cuff palindrome catheter was tunneled subcutaneously to the venotomy site and inserted into the SVC/RA junction through a valved peel-away sheath. Position was confirmed with fluoroscopy. Images were obtained for documentation. Blood was aspirated from the catheter followed by saline and heparin flushes. The appropriate volume and strength of heparin was instilled in each lumen. Caps were applied. The catheter was secured at the tunnel site with Gelfoam and a pursestring suture. The venotomy site was closed with subcuticular Vicryl suture. Dermabond was applied to the small right neck incision. A dry sterile dressing was applied. The catheter is ready for use. No immediate complications. IMPRESSION: Ultrasound and fluoroscopically guided right internal jugular tunneled hemodialysis catheter (19 cm tip to cuff palindrome catheter).  Electronically Signed   By: Jerilynn Mages.  Shick M.D.   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 Daryll Brod, MD Guidance:  Ultrasound and fluoroscopic FLUOROSCOPY TIME:  Fluoroscopy Time: 1 minutes 0 seconds (2 mGy). MEDICATIONS: Ancef 2 g administered within 1 hour of the procedure ANESTHESIA/SEDATION: Versed 0.5 mg IV; Fentanyl 25 mcg IV; Moderate Sedation Time:  17 minutes The patient was continuously monitored during the procedure by the interventional radiology nurse under my direct supervision. CONTRAST:  None. COMPLICATIONS: None immediate. PROCEDURE: Informed consent was obtained from the patient following explanation of the procedure, risks, benefits and alternatives. The patient understands, agrees and consents for the procedure. All questions were addressed. A time out was performed. Maximal barrier sterile technique utilized including caps, mask, sterile gowns, sterile gloves, large sterile drape, hand hygiene, and 2% chlorhexidine scrub. Under sterile conditions and local anesthesia, right internal jugular micropuncture venous access was performed with ultrasound. Images were obtained for documentation. A guide wire was inserted followed by a transitional dilator. Next, a 0.035 guidewire was advanced into the IVC with a 5-French catheter. Measurements were obtained from the right venotomy site to the proximal right atrium. In the right infraclavicular chest, a subcutaneous tunnel was created under sterile conditions and local anesthesia. 1% lidocaine with epinephrine was utilized for this. The 19 cm tip to cuff palindrome catheter was tunneled subcutaneously to the venotomy site and inserted  into the SVC/RA junction through a valved peel-away sheath. Position was confirmed with fluoroscopy. Images were obtained for documentation. Blood was aspirated from the catheter followed by saline and heparin flushes. The appropriate volume and strength of heparin was instilled in each lumen. Caps were applied. The catheter was secured at the tunnel site with Gelfoam and a pursestring suture. The venotomy site was closed with subcuticular Vicryl suture. Dermabond was applied to the small right neck incision. A dry sterile dressing was applied.  The catheter is ready for use. No immediate complications. IMPRESSION: Ultrasound and fluoroscopically guided right internal jugular tunneled hemodialysis catheter (19 cm tip to cuff palindrome catheter). Electronically Signed   By: Jerilynn Mages.  Shick M.D.   On: 08/29/2018 09:47   Ir Dialy Shunt Intro Needle/intracath Initial W/img Left  Result Date: 08/29/2018 INDICATION: End-stage renal disease, occluded left upper arm AV fistula EXAM: Left AV fistulogram (limited) MEDICATIONS: None. ANESTHESIA/SEDATION: Moderate Sedation Time:  None. The patient was continuously monitored during the procedure by the interventional radiology nurse under my direct supervision. FLUOROSCOPY TIME:  Fluoroscopy Time: 1 minutes 0 seconds (2 mGy). COMPLICATIONS: None immediate. PROCEDURE: Informed written consent was obtained from the patient after a thorough discussion of the procedural risks, benefits and alternatives. All questions were addressed. Maximal Sterile Barrier Technique was utilized including caps, mask, sterile gowns, sterile gloves, sterile drape, hand hygiene and skin antiseptic. A timeout was performed prior to the initiation of the procedure. Under sterile conditions, Angiocath access performed of the left upper arm AV fistula. Limited contrast injection for fistulagram performed. Left upper arm fistulagram: This demonstrates near complete thrombotic occlusion of the left upper arm  brachiocephalic fistula. 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 By ultrasound, there is a large amount of thrombus burden throughout the entire left upper arm aneurysmal fistula extending to the axilla. Therefore, any aggressive intervention would be low yield for long term durability and also high risk for pulmonary embolism. 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 Large degree of thrombus burden throughout the fistula. See above comment. ACCESS: Because of the above findings, a tunneled right IJ dialysis catheter will be inserted. These results were called by telephone at the time of interpretation on 08/29/2018 at 9:51 am to Dr. Roney Jaffe , who verbally acknowledged these results. Electronically Signed   By: Jerilynn Mages.  Shick M.D.   On: 08/29/2018 09:51        Scheduled Meds: . amLODipine  10 mg Oral QPM  . calcium acetate  1,334 mg Oral TID WC  . Chlorhexidine Gluconate Cloth  6 each Topical Q0600  . cloNIDine  0.2 mg Transdermal Q Wed  . famotidine  20 mg Oral Daily  . isosorbide mononitrate  60 mg Oral Daily  . lamoTRIgine  50 mg Oral Daily  . latanoprost  1 drop Both Eyes QHS  . LORazepam  0.5 mg Oral QHS  . losartan  50 mg Oral QPM  . multivitamin  1 tablet Oral QHS  . sevelamer carbonate  1,600 mg Oral TID WC  . simethicone  120 mg Oral TID   Continuous Infusions: . piperacillin-tazobactam (ZOSYN)  IV 3.375 g (08/30/18 0220)     LOS: 4 days    Time spent: 76mn    PDomenic Polite MD [facility] Page via [email or web address], password [email or web address] After 7PM please contact night-coverage  08/30/2018, 2:19 PM

## 2018-08-31 DIAGNOSIS — Z515 Encounter for palliative care: Secondary | ICD-10-CM

## 2018-08-31 DIAGNOSIS — Z7189 Other specified counseling: Secondary | ICD-10-CM

## 2018-08-31 MED ORDER — PEG 3350-KCL-NA BICARB-NACL 420 G PO SOLR
4000.0000 mL | Freq: Once | ORAL | Status: AC
  Administered 2018-08-31: 4000 mL via ORAL
  Filled 2018-08-31: qty 4000

## 2018-08-31 NOTE — Progress Notes (Signed)
PROGRESS NOTE    [name]  DZH:[id number] DOB: 1941-07-02 DOA: 08/25/2018 PCP: Hendricks Limes, MD  Brief Narrative: [name] is a 77 year old female with history of ESRD on hemodialysis, mild dementia with cognitive deficits and dysphagia, multiple myeloma on chemotherapy, chronic anemia recently hospitalized with sepsis, gram-negative bacteremia with Achromobacter species, she was treated with IV meropenem, source was not found, discharged to skilled nursing facility to complete a 2-week course of IV meropenem on 11/29. -Presented to the emergency room late 11/25 night with weakness, mild hypotension, hemoglobin of 7 and a sodium of 121 -She is a very poor historian unable to provide any corroborative history, she was given 2 units of PRBCs on admission, was found to be heme positive -Blood Cx from 11/25 night grew multidrug-resistant Achromobacter, infectious disease consulting -Hemodialysis access issues, unable to cannulate left arm AV fistula,  -Underwent right IJ HD catheter placement in radiology 11/29am -12/1 GI consulted, pt declined colonoscopy, plan for virtual colonoscopy  Assessment & Plan:   Recurrent Achromobacter bacteremia -No clear source -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 -Left arm AV fistula site without overt signs or symptoms of infection -Achromobacter from 11/25 is multidrug-resistant, IV meropenem was switched to Zosyn 11/28 -appreciate infectious disease input  -Repeat blood cultures from 11/28- so far  -2D echocardiogram -did not show any evidence of vegetations -2-week course of IV antibiotics recommended, last day is 12/11, will be transitioned to ceftazidime with dialysis to complete course  Anemia -Admitted with hemoglobin of 7.1 this is multifactorial, secondary to chronic kidney disease, multiple myeloma, chemotherapy and some low-grade GI bleed as well, was Hemoccult positive in the emergency  room -transfused 2 units of PRBC on 11/27 -Hemoglobin stable now -In the setting of recurrent gram-negative bacteremia with unclear source and mild GI bleed, ideally needs endoscopic evaluation, with patient being a SNF resident, cognitive deficits and dialysis, quite complex to get this scheduled as outpatient, discussed with [facility] gastroenterology, patient seen she declined a colonoscopy today, GI MD to order virtual colonoscopy   ESRD on hemodialysis -Nephrology following, significant difficulties with dialysis access and cannulating left arm AV fistula  -s/p temporary dialysis catheter in IR  -Dialysis  per renal  Mild dementia with cognitive dysfunction -CT head unremarkable  History of multiple myeloma -Refractory, followed at [facility] -Had been on carfilzomib and dexamethasone per oncology -Valacyclovir for prophylaxis  DVT prophylaxis: SCds Code Status: Full Code Family Communication: no family at bedside Disposition Plan: SNF when workup complete  Consultants:   Nephrology  ID  Equal GI   Procedures:   Antimicrobials:    Subjective: -feels okay, denies any overt complaints, denies ongoing bleeding Objective: Vitals:   08/30/18 2345 08/31/18 0413 08/31/18 0758 08/31/18 1225  BP: 135/83 106/74 130/86 125/75  Pulse: (!) 49   (!) 43  Resp: _0 Temp: 98.6 F (37 C) 98.8 F (37.1 C)  98.6 F (37 C)  TempSrc: Oral Oral  Oral  SpO2: 100% 100%  100%  Weight:      Height:  _1  (1.6 m)      Intake/Output Summary (Last 24 hours) at 08/31/2018 1340 Last data filed at 08/31/2018 1015 Gross per 24 hour  Intake 900 ml  Output -  Net 900 ml   Filed Weights   08/30/18 0500 08/30/18 0744 08/30/18 1130  Weight: 62.7 kg 61.3 kg 59.3 kg    Examination:  Gen: Early, chronically ill-appearing female, laying in bed, mild cognitive deficits, oriented to self  and place only HEENT: Right IJ HD catheter Lungs: Clear bilaterally CVS: RRR,No Gallops,Rubs or new  Murmurs Abd: soft, Non tender, non distended, BS present Extremities: Left arm AV fistula Skin: no new rashes  Data Reviewed:   CBC: Recent Labs  Lab 08/25/18 1607 08/25/18 2301  08/27/18 1254 08/27/18 1602 08/28/18 0409 08/29/18 0420 08/30/18 0806  WBC 3.9* 3.7*   < > 4.2 3.6* 4.0 2.7* 3.3*  NEUTROABS 2.9 2.6  --   --   --   --   --   --   HGB 7.1* 7.1*   < > 9.2* 9.0* 8.9* 8.7* 8.4*  HCT 23.6* 23.3*   < > 29.0* 27.4* 27.5* 28.6* 27.0*  MCV 100.4* 99.6   < > 92.7 92.6 92.0 94.4 98.2  PLT 234 228   < > 240 230 228 199 169   < > = values in this interval not displayed.   Basic Metabolic Panel: Recent Labs  Lab 08/27/18 2325 08/28/18 0409 08/28/18 0850 08/29/18 1106 08/30/18 0806  NA 117* 116* 118* 127* 125*  K 5.4* 5.3* 5.2* 4.2 4.4  CL 85* 84* 85* 95* 89*  CO2 19* 20* 20* 24 25  GLUCOSE 72 71 82 69* 115*  BUN 54* 55* 56* 24* 31*  CREATININE 10.79* 11.54* 11.47* 7.50* 8.42*  CALCIUM 8.2* 8.2* 8.1* 8.3* 8.4*  PHOS  --   --   --   --  5.4*   GFR: Estimated Creatinine Clearance: 4.5 mL/min (A) (by C-G formula based on SCr of 8.42 mg/dL (H)). Liver Function Tests: Recent Labs  Lab 08/25/18 1607 08/28/18 0409 08/30/18 0806  AST 16 15  --   ALT 8 7  --   ALKPHOS 44 42  --   BILITOT 0.8 0.7  --   PROT 8.2* 7.4  --   ALBUMIN 2.7* 2.3* 2.1*   No results for input(s): LIPASE, AMYLASE in the last 168 hours. No results for input(s): AMMONIA in the last 168 hours. Coagulation Profile: Recent Labs  Lab 08/25/18 1607  INR 1.09   Cardiac Enzymes: No results for input(s): CKTOTAL, CKMB, CKMBINDEX, TROPONINI in the last 168 hours. BNP (last 3 results) No results for input(s): PROBNP in the last 8760 hours. HbA1C: No results for input(s): HGBA1C in the last 72 hours. CBG: No results for input(s): GLUCAP in the last 168 hours. Lipid Profile: No results for input(s): CHOL, HDL, LDLCALC, TRIG, CHOLHDL, LDLDIRECT in the last 72 hours. Thyroid Function Tests: No  results for input(s): TSH, T4TOTAL, FREET4, T3FREE, THYROIDAB in the last 72 hours. Anemia Panel: No results for input(s): VITAMINB12, FOLATE, FERRITIN, TIBC, IRON, RETICCTPCT in the last 72 hours. Urine analysis:    Component Value Date/Time   COLORURINE YELLOW 08/12/2018 2025   APPEARANCEUR TURBID (A) 08/12/2018 2025   LABSPEC 1.010 08/12/2018 2025   PHURINE 8.0 08/12/2018 2025   GLUCOSEU 50 (A) 08/12/2018 2025   HGBUR MODERATE (A) 08/12/2018 2025   BILIRUBINUR NEGATIVE 08/12/2018 2025   KETONESUR NEGATIVE 08/12/2018 2025   PROTEINUR 100 (A) 08/12/2018 2025   UROBILINOGEN 0.2 04/10/2015 1800   NITRITE NEGATIVE 08/12/2018 2025   LEUKOCYTESUR LARGE (A) 08/12/2018 2025   Sepsis Labs: _0 (procalcitonin:4,lacticidven:4)  ) Recent Results (from the past 240 hour(s))  Culture, blood (routine x 2)     Status: Abnormal   Collection Time: 08/25/18  4:04 PM  Result Value Ref Range Status   Specimen Description BLOOD RIGHT FOREARM  Final   Special Requests   Final  BOTTLES DRAWN AEROBIC ONLY Blood Culture adequate volume   Culture  Setup Time   Final    AEROBIC BOTTLE ONLY GRAM NEGATIVE RODS CRITICAL VALUE NOTED.  VALUE IS CONSISTENT WITH PREVIOUSLY REPORTED AND CALLED VALUE.    Culture (A)  Final    ACHROMOBACTER SPECIES SUSCEPTIBILITIES PERFORMED ON PREVIOUS CULTURE WITHIN THE LAST 5 DAYS. Performed at [facility], [place] [address] [place], [place] [address]    Report Status 08/29/2018 FINAL  Final  Culture, blood (routine x 2)     Status: Abnormal   Collection Time: 08/25/18  4:09 PM  Result Value Ref Range Status   Specimen Description BLOOD RIGHT ANTECUBITAL  Final   Special Requests   Final    BOTTLES DRAWN AEROBIC AND ANAEROBIC Blood Culture adequate volume   Culture  Setup Time   Final    AEROBIC BOTTLE ONLY GRAM NEGATIVE RODS CRITICAL RESULT CALLED TO, READ BACK BY AND VERIFIED WITH: Diona Browner PHARMD 08/26/18 1825 JDW Performed at [facility],  [place] [address], [place], [place] [address]    Culture ACHROMOBACTER SPECIES (A)  Final   Report Status 08/28/2018 FINAL  Final   Organism ID, Bacteria ACHROMOBACTER SPECIES  Final      Susceptibility   Achromobacter species - MIC*    CEFEPIME 16 INTERMEDIATE Intermediate     CEFAZOLIN >=64 RESISTANT Resistant     GENTAMICIN >=16 RESISTANT Resistant     CIPROFLOXACIN >=4 RESISTANT Resistant     IMIPENEM 8 INTERMEDIATE Intermediate     TRIMETH/SULFA 160 RESISTANT Resistant     * ACHROMOBACTER SPECIES  Culture, blood (routine x 2)     Status: None (Preliminary result)   Collection Time: 08/28/18  8:42 AM  Result Value Ref Range Status   Specimen Description BLOOD RIGHT ANTECUBITAL  Final   Special Requests   Final    BOTTLES DRAWN AEROBIC AND ANAEROBIC Blood Culture adequate volume   Culture   Final    NO GROWTH 3 DAYS Performed at [facility], [place] [address] [place], [place] [address]    Report Status PENDING  Incomplete  Culture, blood (routine x 2)     Status: None (Preliminary result)   Collection Time: 08/28/18  8:42 AM  Result Value Ref Range Status   Specimen Description BLOOD BLOOD RIGHT FOREARM  Final   Special Requests   Final    BOTTLES DRAWN AEROBIC AND ANAEROBIC Blood Culture adequate volume   Culture   Final    NO GROWTH 3 DAYS Performed at [facility], [place] [address], [place],  [address]    Report Status PENDING  Incomplete         Radiology Studies: No results found.      Scheduled Meds: . amLODipine  10 mg Oral QPM  . calcium acetate  1,334 mg Oral TID WC  . Chlorhexidine Gluconate Cloth  6 each Topical Q0600  . cloNIDine  0.2 mg Transdermal Q Wed  . famotidine  20 mg Oral Daily  . isosorbide mononitrate  60 mg Oral Daily  . lamoTRIgine  50 mg Oral Daily  . latanoprost  1 drop Both Eyes QHS  . LORazepam  0.5 mg Oral QHS  . losartan  50 mg Oral QPM  . multivitamin  1 tablet Oral QHS  . sevelamer carbonate  1,600 mg Oral TID WC   . simethicone  120 mg Oral TID   Continuous Infusions: . piperacillin-tazobactam (ZOSYN)  IV  3.375 g (08/31/18 0406)     LOS: 5 days    Time spent: 21mn    PDomenic Polite MD [facility] Page via [email or web address], password [email or web address] After 7PM please contact night-coverage  08/31/2018, 1:40 PM

## 2018-08-31 NOTE — Progress Notes (Addendum)
Paged Dr. Alessandra Bevels to clarify pt's prep order.  Received OK order to give whole 4090mL since it will not stay in her system.  Idolina Primer, RN

## 2018-08-31 NOTE — Consult Note (Addendum)
Referring Provider:  [facility] Primary Care Physician:  Hendricks Limes, MD Primary Gastroenterologist:  unassigned  Reason for Consultation:  Anemia, occult blood positive stool  HPI: [name] is a 77 y.o. female with past medical history of multiple myeloma on chemotherapy, history of chronic anemia, history of end-stage renal disease on dialysis, mild dementia, gram-negative to bacteremia presented to the hospital on 08/25/2018 with weakness. She was found to have a hemoglobin of 7. Occult blood was positive.\  Chart reviewed. Patient with history of chronic anemia dated back to 2015 when her hemoglobin was 5.9.he was seen by Dr. Deatra Ina  and Dr. Collene Mares at that time.occult blood was positive at that time as well.  Patient examined at bedside. She denying any blood in the stool or black stool. Denied abdominal pain. Complaining of nausea but denied any vomiting. Occasional constipation but denied any diarrhea.  Had colonoscopy several years ago in [place]. Report not available to review.  Past Medical History:  Diagnosis Date  . Anxiety   . Closed fracture of right distal femur ([place]) 09/01/2015  . Depression   . ESRD (end stage renal disease) on dialysis [facility])    "TTS; [place] [facility]" (04/24/2017)  . GERD (gastroesophageal reflux disease) 04/23/2015  . HCAP (healthcare-associated pneumonia) 04/24/2017  . Heart murmur   . History of blood transfusion    "low HgB when I was going to dialysis center"  . Hypertension   . Hypertension associated with end stage renal disease on dialysis 03/11/2014  . Malnutrition of moderate degree 09/02/2015  . Multiple myeloma ([place]) 03/11/2014  . Patient is Jehovah's Witness    "I'd rather never have any blood transfusions unless absolutely necessary; please check with me 1st". (04/24/2017)  . Pneumonia ~ 2016    Past Surgical History:  Procedure Laterality Date  . AV FISTULA PLACEMENT Left   . I&D EXTREMITY Left 02/27/2016   Procedure: ARTHROSCOPIC IRRIGATION  AND DEBRIDEMENT EXTREMITY;  Surgeon: Renette Butters, MD;  Location: [facility];  Service: Orthopedics;  Laterality: Left;  . IR DIALY SHUNT INTRO NEEDLE/INTRACATH INITIAL W/IMG LEFT Left 08/29/2018  . IR FLUORO GUIDE CV LINE RIGHT  08/29/2018  . IR US GUIDE VASC ACCESS RIGHT  08/29/2018  . TEE WITHOUT CARDIOVERSION N/A 02/29/2016   Procedure: TRANSESOPHAGEAL ECHOCARDIOGRAM (TEE);  Surgeon: Thayer Headings, MD;  Location: [facility];  Service: Cardiovascular;  Laterality: N/A;    Prior to Admission medications   Medication Sig Start Date End Date Taking? Authorizing Provider  acetaminophen (TYLENOL) 325 MG tablet Take 650 mg by mouth every 6 (six) hours as needed for fever or headache (pain). Do not exceed 3000 mg in 24 hours   Yes [provider]  amLODipine (NORVASC) 10 MG tablet Take 10 mg by mouth every evening. Hold for SBP <100MM/HG   Yes [provider]  bisacodyl (DULCOLAX) 10 MG suppository Place 10 mg rectally daily as needed for moderate constipation (constipation not relieved by MOM).   Yes [provider]  calcium acetate (PHOSLO) 667 MG capsule Take 1,334 mg by mouth 3 (three) times daily with meals.    Yes [provider]  cloNIDine (CATAPRES - DOSED IN MG/24 HR) 0.2 mg/24hr patch Place 1 patch (0.2 mg total) onto the skin once a week. 06/04/18  Yes Emokpae, Courage, MD  cloNIDine (CATAPRES) 0.1 MG tablet Take 0.1 mg by mouth every 8 (eight) hours as needed (sbp >180).    Yes [provider]  hydrocortisone cream 1 % Apply  1 application topically daily as needed for itching.   Yes [provider]  isosorbide mononitrate (IMDUR) 60 MG 24 hr tablet Take 60 mg by mouth daily.  03/24/15  Yes [provider]  lamoTRIgine (LAMICTAL) 25 MG tablet Take 50 mg by mouth daily.    Yes [provider]  latanoprost (XALATAN) 0.005 % ophthalmic solution Place 1 drop into both eyes at bedtime.    Yes [provider]   loperamide (IMODIUM A-D) 2 MG tablet Take 4 mg by mouth daily as needed (on Dialysis days).    Yes [provider]  LORazepam (ATIVAN) 0.5 MG tablet Take 0.5 mg by mouth at bedtime.   Yes [provider]  losartan (COZAAR) 50 MG tablet Take 50 mg by mouth every evening.   Yes [provider]  multivitamin (RENA-VIT) TABS tablet Take 1 tablet by mouth daily.   Yes [provider]  ranitidine (ZANTAC) 300 MG tablet Take 300 mg by mouth at bedtime. Reported on 03/05/2016   Yes [provider]  sertraline (ZOLOFT) 50 MG tablet Take 50 mg by mouth daily.   Yes [provider]  sevelamer carbonate (RENVELA) 800 MG tablet Take 2 tablets (1,600 mg total) by mouth 3 (three) times daily with meals. 05/30/18  Yes Emokpae, Courage, MD  simethicone (MYLICON) 025 MG chewable tablet Chew 125 mg by mouth daily. 6PM   Yes [provider]  Sodium Phosphates (FLEET ENEMA RE) Place 1 enema rectally daily as needed (constipation not relieved by bisacodyl suppository).   Yes [provider]  valACYclovir (VALTREX) 500 MG tablet Take 1 tablet (500 mg total) by mouth every other day. 03/01/16  Yes Hongalgi, Lenis Dickinson, MD    Scheduled Meds: . amLODipine  10 mg Oral QPM  . calcium acetate  1,334 mg Oral TID WC  . Chlorhexidine Gluconate Cloth  6 each Topical Q0600  . cloNIDine  0.2 mg Transdermal Q Wed  . famotidine  20 mg Oral Daily  . isosorbide mononitrate  60 mg Oral Daily  . lamoTRIgine  50 mg Oral Daily  . latanoprost  1 drop Both Eyes QHS  . LORazepam  0.5 mg Oral QHS  . losartan  50 mg Oral QPM  . multivitamin  1 tablet Oral QHS  . sevelamer carbonate  1,600 mg Oral TID WC  . simethicone  120 mg Oral TID   Continuous Infusions: . piperacillin-tazobactam (ZOSYN)  IV 3.375 g (08/31/18 0406)   PRN Meds:.acetaminophen **OR** acetaminophen, bisacodyl, hydrALAZINE, ondansetron **OR** ondansetron (ZOFRAN) IV, polyethylene glycol, RESOURCE  THICKENUP CLEAR, saccharomyces boulardii, senna-docusate, simethicone  Allergies as of 08/25/2018 - Review Complete 08/25/2018  Allergen Reaction Noted  . Phenergan [promethazine hcl] Other (See Comments) 05/01/2018    Family History  Problem Relation Age of Onset  . Hypertension Mother   . Hypertension Father     Social History   Socioeconomic History  . Marital status: Single    Spouse name: Not on file  . Number of children: 3  . Years of education: Not on file  . Highest education level: Not on file  Occupational History  . Occupation: Retired     Comment: Pharmacist, hospital asst.  Social Needs  . Financial resource strain: Not hard at all  . Food insecurity:    Worry: Never true    Inability: Never true  . Transportation needs:    Medical: No    Non-medical: No  Tobacco Use  . Smoking status: Never Smoker  .  Smokeless tobacco: Never Used  Substance and Sexual Activity  . Alcohol use: No    Alcohol/week: 0.0 standard drinks  . Drug use: No  . Sexual activity: Not Currently  Lifestyle  . Physical activity:    Days per week: 3 days    Minutes per session: 30 min  . Stress: Not at all  Relationships  . Social connections:    Talks on phone: Twice a week    Gets together: Never    Attends religious service: Never    Active member of club or organization: No    Attends meetings of clubs or organizations: Never    Relationship status: Never married  . Intimate partner violence:    Fear of current or ex partner: No    Emotionally abused: No    Physically abused: No    Forced sexual activity: No  Other Topics Concern  . Not on file  Social History Narrative   Patient recently moved to [place] from [place] in May 2015.   Patient was born in [place], [place].   Patient has brother in [place] area.   Long-term care resident of [facility].    Review of Systems: All negative except as stated above in HPI.  Physical Exam: Vital  signs: Vitals:   08/30/18 2345 08/31/18 0413  BP: 135/83 106/74  Pulse: (!) 49   Resp: 17 13  Temp: 98.6 F (37 C) 98.8 F (37.1 C)  SpO2: 100% 100%   Last BM Date: 08/31/18 Physical Exam  Constitutional: She is oriented to person, place, and time. She appears well-nourished. No distress.  Elderly-appearing  HENT:  Head: Normocephalic and atraumatic.  Mouth/Throat: No oropharyngeal exudate.  Eyes: EOM are normal. No scleral icterus.  Neck: Normal range of motion. Neck supple.  Cardiovascular: Normal rate and regular rhythm.  Pulmonary/Chest: Breath sounds normal. No respiratory distress.  Abdominal: Soft. Bowel sounds are normal. She exhibits no distension. There is no tenderness. There is no rebound and no guarding.  Musculoskeletal: Normal range of motion. She exhibits no edema.  Neurological: She is alert and oriented to person, place, and time.  Skin: Skin is warm. No erythema.  Psychiatric:  anxious    GI:  Lab Results: Recent Labs    08/29/18 0420 08/30/18 0806  WBC 2.7* 3.3*  HGB 8.7* 8.4*  HCT 28.6* 27.0*  PLT 199 169   BMET Recent Labs    08/28/18 0850 08/29/18 1106 08/30/18 0806  NA 118* 127* 125*  K 5.2* 4.2 4.4  CL 85* 95* 89*  CO2 20* 24 25  GLUCOSE 82 69* 115*  BUN 56* 24* 31*  CREATININE 11.47* 7.50* 8.42*  CALCIUM 8.1* 8.3* 8.4*   LFT Recent Labs    08/30/18 0806  ALBUMIN 2.1*   PT/INR No results for input(s): LABPROT, INR in the last 72 hours.   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 Daryll Brod, MD Guidance:  Ultrasound and fluoroscopic FLUOROSCOPY TIME:  Fluoroscopy Time: 1 minutes 0 seconds (2 mGy). MEDICATIONS: Ancef  2 g administered within 1 hour of the procedure ANESTHESIA/SEDATION: Versed 0.5 mg IV; Fentanyl 25 mcg IV; Moderate Sedation Time:  17 minutes The patient was continuously monitored during the procedure by the interventional radiology  nurse under my direct supervision. CONTRAST:  None. COMPLICATIONS: None immediate. PROCEDURE: Informed consent was obtained from the patient following explanation of the procedure, risks, benefits and alternatives. The patient understands, agrees and consents for the procedure. All questions were addressed. A time out was performed. Maximal barrier sterile technique utilized including caps, mask, sterile gowns, sterile gloves, large sterile drape, hand hygiene, and 2% chlorhexidine scrub. Under sterile conditions and local anesthesia, right internal jugular micropuncture venous access was performed with ultrasound. Images were obtained for documentation. A guide wire was inserted followed by a transitional dilator. Next, a 0.035 guidewire was advanced into the IVC with a 5-French catheter. Measurements were obtained from the right venotomy site to the proximal right atrium. In the right infraclavicular chest, a subcutaneous tunnel was created under sterile conditions and local anesthesia. 1% lidocaine with epinephrine was utilized for this. The 19 cm tip to cuff palindrome catheter was tunneled subcutaneously to the venotomy site and inserted into the SVC/RA junction through a valved peel-away sheath. Position was confirmed with fluoroscopy. Images were obtained for documentation. Blood was aspirated from the catheter followed by saline and heparin flushes. The appropriate volume and strength of heparin was instilled in each lumen. Caps were applied. The catheter was secured at the tunnel site with Gelfoam and a pursestring suture. The venotomy site was closed with subcuticular Vicryl suture. Dermabond was applied to the small right neck incision. A dry sterile dressing was applied.  The catheter is ready for use. No immediate complications. IMPRESSION: Ultrasound and fluoroscopically guided right internal jugular tunneled hemodialysis catheter (19 cm tip to cuff palindrome catheter). Electronically Signed   By: Jerilynn Mages.  Shick M.D.   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 Daryll Brod, MD Guidance:  Ultrasound and fluoroscopic FLUOROSCOPY TIME:  Fluoroscopy Time: 1 minutes 0 seconds (2 mGy). MEDICATIONS: Ancef 2 g administered within 1 hour of the procedure ANESTHESIA/SEDATION: Versed 0.5 mg IV; Fentanyl 25 mcg IV; Moderate Sedation Time:  17 minutes The patient was continuously monitored during the procedure by the interventional radiology nurse under my direct supervision. CONTRAST:  None. COMPLICATIONS: None immediate. PROCEDURE: Informed consent was obtained from the patient following explanation of the procedure, risks, benefits and alternatives. The patient understands, agrees and consents for the procedure. All questions were addressed. A time out was performed. Maximal barrier sterile technique utilized including caps, mask, sterile gowns, sterile gloves, large sterile drape, hand hygiene, and 2% chlorhexidine scrub. Under sterile conditions and local anesthesia, right internal jugular micropuncture venous access was performed with ultrasound. Images were obtained for documentation. A guide wire was inserted followed by a transitional dilator. Next, a 0.035 guidewire was advanced into the IVC with a 5-French catheter. Measurements were obtained from the right venotomy site to the proximal right atrium. In the right infraclavicular chest, a subcutaneous tunnel was created  under sterile conditions and local anesthesia. 1% lidocaine with epinephrine was utilized for this. The 19 cm tip to cuff palindrome catheter was tunneled subcutaneously to the venotomy site and inserted into the SVC/RA junction through a valved peel-away sheath. Position was confirmed with fluoroscopy. Images were obtained for documentation. Blood was aspirated from the catheter followed by saline and heparin flushes. The appropriate volume and strength of  heparin was instilled in each lumen. Caps were applied. The catheter was secured at the tunnel site with Gelfoam and a pursestring suture. The venotomy site was closed with subcuticular Vicryl suture. Dermabond was applied to the small right neck incision. A dry sterile dressing was applied. The catheter is ready for use. No immediate complications. IMPRESSION: Ultrasound and fluoroscopically guided right internal jugular tunneled hemodialysis catheter (19 cm tip to cuff palindrome catheter). Electronically Signed   By: Jerilynn Mages.  Shick M.D.   On: 08/29/2018 09:47   Ir Dialy Shunt Intro Needle/intracath Initial W/img Left  Result Date: 08/29/2018 INDICATION: End-stage renal disease, occluded left upper arm AV fistula EXAM: Left AV fistulogram (limited) MEDICATIONS: None. ANESTHESIA/SEDATION: Moderate Sedation Time:  None. The patient was continuously monitored during the procedure by the interventional radiology nurse under my direct supervision. FLUOROSCOPY TIME:  Fluoroscopy Time: 1 minutes 0 seconds (2 mGy). COMPLICATIONS: None immediate. PROCEDURE: Informed written consent was obtained from the patient after a thorough discussion of the procedural risks, benefits and alternatives. All questions were addressed. Maximal Sterile Barrier Technique was utilized including caps, mask, sterile gowns, sterile gloves, sterile drape, hand hygiene and skin antiseptic. A timeout was performed prior to the initiation of the procedure. Under sterile conditions, Angiocath  access performed of the left upper arm AV fistula. Limited contrast injection for fistulagram performed. Left upper arm fistulagram: This demonstrates near complete thrombotic occlusion of the left upper arm brachiocephalic fistula. 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 By ultrasound, there is a large amount of thrombus burden throughout the entire left upper arm aneurysmal fistula extending to the axilla. Therefore, any aggressive intervention would be low yield for long term durability and also high risk for pulmonary embolism. 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 Large degree of thrombus burden throughout the fistula. See above comment. ACCESS: Because of the above findings, a tunneled right IJ dialysis catheter will be inserted. These results were called by telephone at the time of interpretation on 08/29/2018 at 9:51 am to Dr. Roney Jaffe , who verbally acknowledged these results. Electronically Signed   By: Jerilynn Mages.  Shick M.D.   On: 08/29/2018 09:51    Impression/Plan: - chronic anemia with occult blood positive stool. No overt bleeding. - Recurrent Achromobacter species bacteremia - multiple myeloma - ESRD on dialysis.  Recommendations -------------------------- - Patient with chronic anemia without any overt bleeding.Recurrent Achromobacter species bacteremia has been documented in patient with underlying malignancy without any specific source.( primary bacteremia) - patient is not sure about the colonoscopy because of associated risks. - D/W Dr.Joseph. I think it is  reasonable to pursue a virtual colonoscopy for further evaluation of the colon.  ** Prep order for virtual colonoscopy usually provided by radiology. If do not receive any order for preparation for virtual colonoscopy, please call on-call GI physician **  - GI will follow.    LOS: 5 days   Otis Brace  MD,  FACP 08/31/2018, 7:43 AM  Contact #  [phone]

## 2018-08-31 NOTE — Progress Notes (Signed)
-   Received call from CT department. They do not do inpatient Virtual Colonosocpy.  - plan for  [facility] for tomorrow. Prep orders placed.   Otis Brace MD, [place] 08/31/2018, 3:05 PM  Contact #  [phone]

## 2018-08-31 NOTE — Consult Note (Signed)
Consultation Note Date: 08/31/2018   Patient Name: [name]  DOB: 1941/06/23  MRN: [id number]  Age / Sex: 77 y.o., female  PCP: Hendricks Limes, MD Referring Physician: Domenic Polite, MD  Reason for Consultation: Establishing goals of care  HPI/Patient Profile: 77 y.o. female  admitted on 08/25/2018 past medical history of refractory multiple myeloma on chemotherapy/ per family unable to have last treatment 2/2 hypotension , history of chronic anemia, history of end-stage renal disease on dialysis, mild dementia, gram-negative to bacteremia presented to the hospital on 08/25/2018 with weakness. She was found to have a hemoglobin of 7. Occult blood was positive   In the ER patient agreed for transfusion and 2 units of PRBC has been ordered.    It seems she  has some connection but not a strict Jehovah's Witness  According to her daughter she has lived at [facility] for several years, daughter does speak to cognitive changes since 2014 when the patient's son passed away.  Patient has multiple comorbidities and is high risk for decompensation long-term prognosis is limited.  Patient does not have capacity to make her own decisions at this time at family face treatment option decisions, advanced directive decisions and anticipatory care needs.  Clinical Assessment and Goals of Care:    This NP Wadie Lessen reviewed medical records, received report from team, assessed the patient and then meet at the patient's bedside along with her daughter by phone/ Phineas Real and her brother/Luis Canova  to discuss diagnosis, prognosis, GOC, EOL wishes disposition and options.  Concept of Palliative Care was discussed  A  discussion was had today regarding advanced directives.  Concepts specific to code status, artifical feeding and hydration, continued IV antibiotics and rehospitalization was had.  The difference  between a aggressive medical intervention path  and a palliative comfort care path for this patient at this time was had.  Values and goals of care important to patient and family were attempted to be elicited.  Questions and concerns addressed.   Family encouraged to call with questions or concerns.    PMT will continue to support holistically.    NEXT OF KIN    SUMMARY OF RECOMMENDATIONS    Daughter was adamant and open to all offered and available medical interventions to prolong life, including blood transfusions   Code Status/Advance Care Planning:  Full code   Palliative Prophylaxis:   Aspiration, Bowel Regimen, Delirium Protocol and Oral Care  Additional Recommendations (Limitations, Scope, Preferences):  Full Scope Treatment  Psycho-social/Spiritual:   Desire for further Chaplaincy support:yes  Additional Recommendations:  Emotional support offered  Prognosis:   Unable to determine  Discharge Planning: To Be Determined      Primary Diagnoses: Present on Admission: . Pressure ulcer, stage I . Neurocognitive deficits . Multiple myeloma ([place]) . Hypertensive urgency . Anemia associated with chronic renal failure . Hyponatremia . Anemia . Hypothermia   I have reviewed the medical record, interviewed the patient and family, and examined the patient. The following aspects are pertinent.  Past Medical History:  Diagnosis Date  . Anxiety   . Closed fracture of right distal femur ([place]) 09/01/2015  . Depression   . ESRD (end stage renal disease) on dialysis [facility])    "TTS; [place] [facility]" (04/24/2017)  . GERD (gastroesophageal reflux disease) 04/23/2015  . HCAP (healthcare-associated pneumonia) 04/24/2017  . Heart murmur   . History of blood transfusion    "low HgB when I was going to dialysis center"  . Hypertension   . Hypertension associated with end stage renal disease on dialysis 03/11/2014  . Malnutrition of moderate degree 09/02/2015  . Multiple  myeloma ([facility]) 03/11/2014  . Patient is Jehovah's Witness    "I'd rather never have any blood transfusions unless absolutely necessary; please check with me 1st". (04/24/2017)  . Pneumonia ~ 2016   Social History   Socioeconomic History  . Marital status: Single    Spouse name: Not on file  . Number of children: 3  . Years of education: Not on file  . Highest education level: Not on file  Occupational History  . Occupation: Retired     Comment: Pharmacist, hospital asst.  Social Needs  . Financial resource strain: Not hard at all  . Food insecurity:    Worry: Never true    Inability: Never true  . Transportation needs:    Medical: No    Non-medical: No  Tobacco Use  . Smoking status: Never Smoker  . Smokeless tobacco: Never Used  Substance and Sexual Activity  . Alcohol use: No    Alcohol/week: 0.0 standard drinks  . Drug use: No  . Sexual activity: Not Currently  Lifestyle  . Physical activity:    Days per week: 3 days    Minutes per session: 30 min  . Stress: Not at all  Relationships  . Social connections:    Talks on phone: Twice a week    Gets together: Never    Attends religious service: Never    Active member of club or organization: No    Attends meetings of clubs or organizations: Never    Relationship status: Never married  Other Topics Concern  . Not on file  Social History Narrative   Patient recently moved to [place] from [place] in May 2015.   Patient was born in [place], [place].   Patient has brother in [place] area.   Long-term care resident of [facility].   Family History  Problem Relation Age of Onset  . Hypertension Mother   . Hypertension Father    Scheduled Meds: . amLODipine  10 mg Oral QPM  . calcium acetate  1,334 mg Oral TID WC  . Chlorhexidine Gluconate Cloth  6 each Topical Q0600  . cloNIDine  0.2 mg Transdermal Q Wed  . famotidine  20 mg Oral Daily  . isosorbide mononitrate  60 mg Oral Daily  . lamoTRIgine  50 mg  Oral Daily  . latanoprost  1 drop Both Eyes QHS  . LORazepam  0.5 mg Oral QHS  . losartan  50 mg Oral QPM  . multivitamin  1 tablet Oral QHS  . sevelamer carbonate  1,600 mg Oral TID WC  . simethicone  120 mg Oral TID   Continuous Infusions: . piperacillin-tazobactam (ZOSYN)  IV 3.375 g (08/31/18 0406)   PRN Meds:.acetaminophen **OR** acetaminophen, bisacodyl, hydrALAZINE, ondansetron **OR** ondansetron (ZOFRAN) IV, polyethylene glycol, RESOURCE THICKENUP CLEAR, saccharomyces boulardii, senna-docusate, simethicone Medications Prior to Admission:  Prior to Admission medications   Medication Sig  Start Date End Date Taking? Authorizing Provider  acetaminophen (TYLENOL) 325 MG tablet Take 650 mg by mouth every 6 (six) hours as needed for fever or headache (pain). Do not exceed 3000 mg in 24 hours   Yes [provider]  amLODipine (NORVASC) 10 MG tablet Take 10 mg by mouth every evening. Hold for SBP <100MM/HG   Yes [provider]  bisacodyl (DULCOLAX) 10 MG suppository Place 10 mg rectally daily as needed for moderate constipation (constipation not relieved by MOM).   Yes [provider]  calcium acetate (PHOSLO) 667 MG capsule Take 1,334 mg by mouth 3 (three) times daily with meals.    Yes [provider]  cloNIDine (CATAPRES - DOSED IN MG/24 HR) 0.2 mg/24hr patch Place 1 patch (0.2 mg total) onto the skin once a week. 06/04/18  Yes Emokpae, Courage, MD  cloNIDine (CATAPRES) 0.1 MG tablet Take 0.1 mg by mouth every 8 (eight) hours as needed (sbp >180).    Yes [provider]  hydrocortisone cream 1 % Apply 1 application topically daily as needed for itching.   Yes [provider]  isosorbide mononitrate (IMDUR) 60 MG 24 hr tablet Take 60 mg by mouth daily.  03/24/15  Yes [provider]  lamoTRIgine (LAMICTAL) 25 MG tablet Take 50 mg by mouth daily.    Yes [provider]  latanoprost (XALATAN) 0.005 % ophthalmic solution  Place 1 drop into both eyes at bedtime.    Yes [provider]  loperamide (IMODIUM A-D) 2 MG tablet Take 4 mg by mouth daily as needed (on Dialysis days).    Yes [provider]  LORazepam (ATIVAN) 0.5 MG tablet Take 0.5 mg by mouth at bedtime.   Yes [provider]  losartan (COZAAR) 50 MG tablet Take 50 mg by mouth every evening.   Yes [provider]  multivitamin (RENA-VIT) TABS tablet Take 1 tablet by mouth daily.   Yes [provider]  ranitidine (ZANTAC) 300 MG tablet Take 300 mg by mouth at bedtime. Reported on 03/05/2016   Yes [provider]  sertraline (ZOLOFT) 50 MG tablet Take 50 mg by mouth daily.   Yes [provider]  sevelamer carbonate (RENVELA) 800 MG tablet Take 2 tablets (1,600 mg total) by mouth 3 (three) times daily with meals. 05/30/18  Yes Emokpae, Courage, MD  simethicone (MYLICON) 836 MG chewable tablet Chew 125 mg by mouth daily. 6PM   Yes [provider]  Sodium Phosphates (FLEET ENEMA RE) Place 1 enema rectally daily as needed (constipation not relieved by bisacodyl suppository).   Yes [provider]  valACYclovir (VALTREX) 500 MG tablet Take 1 tablet (500 mg total) by mouth every other day. 03/01/16  Yes Hongalgi, Lenis Dickinson, MD   Allergies  Allergen Reactions  . Phenergan [Promethazine Hcl] Other (See Comments)    Acute encephalopathy in the context of refusal to go to hemodialysis and administration of Phenergan for nausea and vomiting   Review of Systems  Neurological: Positive for weakness.    Physical Exam  Constitutional: She appears well-developed. She appears ill.  Cardiovascular: Bradycardia present.  Pulmonary/Chest: Effort normal and breath sounds normal.  Neurological: She is alert.  Skin: Skin is warm and dry.  Psychiatric:  -Patient is intermittently confused and has very little insight into the seriousness of her current medical situation.    Vital Signs: BP 130/86  (BP Location: Right Arm)   Pulse (!) 49   Temp 98.8 F (37.1 C) (  Oral)   Resp 15   Ht '5\' 3"'  (1.6 m)   Wt 59.3 kg   SpO2 100%   BMI 23.16 kg/m  Pain Scale: 0-10   Pain Score: 0-No pain   SpO2: SpO2: 100 % O2 Device:SpO2: 100 % O2 Flow Rate: .O2 Flow Rate (L/min): 2 L/min  IO: Intake/output summary:   Intake/Output Summary (Last 24 hours) at 08/31/2018 0944 Last data filed at 08/30/2018 1837 Gross per 24 hour  Intake 480 ml  Output 2008 ml  Net -1528 ml    LBM: Last BM Date: 08/31/18 Baseline Weight: Weight: 60 kg Most recent weight: Weight: 59.3 kg     Palliative Assessment/Data: 30 % at best   Discussed with Dr Broadus John   Time In: 1200 Time Out: 1315 Time Total: 75 minutes Greater than 50%  of this time was spent counseling and coordinating care related to the above assessment and plan.  Signed by: Wadie Lessen, NP   Please contact Palliative Medicine Team phone at [phone] for questions and concerns.  For individual provider: See Shea Evans

## 2018-08-31 NOTE — Progress Notes (Signed)
[facility] Progress Note  Subjective: feeling good today, no c/o    Vitals:   08/30/18 2345 08/31/18 0413 08/31/18 0758 08/31/18 1225  BP: 135/83 106/74 130/86 125/75  Pulse: (!) 49   (!) 43  Resp: 17 13 15 17   Temp: 98.6 F (37 C) 98.8 F (37.1 C)  98.6 F (37 C)  TempSrc: Oral Oral  Oral  SpO2: 100% 100%  100%  Weight:      Height:  5\' 3"  (1.6 m)      Inpatient medications: . amLODipine  10 mg Oral QPM  . calcium acetate  1,334 mg Oral TID WC  . Chlorhexidine Gluconate Cloth  6 each Topical Q0600  . cloNIDine  0.2 mg Transdermal Q Wed  . famotidine  20 mg Oral Daily  . isosorbide mononitrate  60 mg Oral Daily  . lamoTRIgine  50 mg Oral Daily  . latanoprost  1 drop Both Eyes QHS  . LORazepam  0.5 mg Oral QHS  . losartan  50 mg Oral QPM  . multivitamin  1 tablet Oral QHS  . polyethylene glycol-electrolytes  4,000 mL Oral Once  . sevelamer carbonate  1,600 mg Oral TID WC  . simethicone  120 mg Oral TID   . piperacillin-tazobactam (ZOSYN)  IV 3.375 g (08/31/18 0406)   acetaminophen **OR** acetaminophen, bisacodyl, hydrALAZINE, ondansetron **OR** ondansetron (ZOFRAN) IV, polyethylene glycol, RESOURCE THICKENUP CLEAR, saccharomyces boulardii, senna-docusate, simethicone  Iron/TIBC/Ferritin/ %Sat    Component Value Date/Time   IRON 149 (H) 03/13/2014 0442   TIBC NOT CALC 03/13/2014 0442   FERRITIN 2,786 (H) 03/13/2014 0442   IRONPCTSAT NOT CALC 03/13/2014 0442    Exam: Gen stable, cheerful, friend is visiting No jvd or bruits Chest clear bilat to bases RRR no MRG Abd soft ntnd no mass or ascites +bs Ext no LE edema Neuro is alert, Ox 2, nonfocal LUA AVF no bruit    Home meds:  - clonidine patch 0.2 qwk/ clonidine 0.1 tid prn/ isosorbide mononitrate 60 qd/ amlodipine 10  - sevelamer carbonate 1.6gm ac tid/ calcium acetate 1334 ac  - lamotrigine 50 qd  - ranitidine 300 hs/ prns/ vitamins/ eyedrops  Dialysis: TTS   3.5h   425/A1.5  58kg   2/2.25  Hep none   LUA AVF - hectorol 3 ug  - epo alfa 18000 tiw IV  - goes to all HD sessions but signs off early frequently    CXR 11/25 > clear, no CHF     Impression/ Plan: 1. Achromobacter sepsis - recurrent, had same issue last month.  Per ID changed to IV zosyn for now, then [facility] when dc'd to be given at OP HD unit through Dec 11th.   2. Anemia - Hb 7.1 on admit, heme +. Sp 2u prbc 11/27. GI consulting for poss endoscopy/ colonoscopy.  3. AMS - delirium. Improving. Underlying dementia.  4. ESRD - HD TTS. Next HD Tuesday if still here.  5. HD access - AVF L arm clotted, large aneurysmal AVF, not able to salvage.  Will need new access when more stable and after bacteremia is treated. Using new [facility] for now.   6. Volume excess - resolved, 1kg up 7. MBD ckd - cont meds 8. Anemia ckd - cont esa prn, Hb down 8.9 9. HTN - cont meds x 3 10. Dispo - stable for d/c from renal standpoint    Kelly Splinter MD [place] pager [phone]   08/31/2018, 3:15 PM   Recent Labs  Lab 08/25/18 1607  08/28/18 0409  08/29/18 1106 08/30/18 0806  NA 123*   < > 116*   < > 127* 125*  K 4.3   < > 5.3*   < > 4.2 4.4  CL 86*   < > 84*   < > 95* 89*  CO2 23   < > 20*   < > 24 25  GLUCOSE 104*   < > 71   < > 69* 115*  BUN 45*   < > 55*   < > 24* 31*  CREATININE 9.21*   < > 11.54*   < > 7.50* 8.42*  CALCIUM 8.5*   < > 8.2*   < > 8.3* 8.4*  PHOS  --   --   --   --   --  5.4*  ALBUMIN 2.7*  --  2.3*  --   --  2.1*  INR 1.09  --   --   --   --   --    < > = values in this interval not displayed.   Recent Labs  Lab 08/25/18 1607 08/28/18 0409  AST 16 15  ALT 8 7  ALKPHOS 44 42  BILITOT 0.8 0.7  PROT 8.2* 7.4   Recent Labs  Lab 08/25/18 1607 08/25/18 2301  08/29/18 0420 08/30/18 0806  WBC 3.9* 3.7*   < > 2.7* 3.3*  NEUTROABS 2.9 2.6  --   --   --   HGB 7.1* 7.1*   < > 8.7* 8.4*  HCT 23.6* 23.3*   < > 28.6* 27.0*  MCV 100.4* 99.6   < > 94.4 98.2  PLT 234 228   <  > 199 169   < > = values in this interval not displayed.

## 2018-09-01 ENCOUNTER — Inpatient Hospital Stay (HOSPITAL_COMMUNITY): Payer: Medicare Other

## 2018-09-01 DIAGNOSIS — R531 Weakness: Secondary | ICD-10-CM

## 2018-09-01 LAB — BASIC METABOLIC PANEL
Anion gap: 8 (ref 5–15)
BUN: 22 mg/dL (ref 8–23)
CO2: 28 mmol/L (ref 22–32)
Calcium: 8.7 mg/dL — ABNORMAL LOW (ref 8.9–10.3)
Chloride: 89 mmol/L — ABNORMAL LOW (ref 98–111)
Creatinine, Ser: 6.36 mg/dL — ABNORMAL HIGH (ref 0.44–1.00)
GFR calc Af Amer: 7 mL/min — ABNORMAL LOW (ref >60)
GFR calc non Af Amer: 6 mL/min — ABNORMAL LOW (ref >60)
Glucose, Bld: 77 mg/dL (ref 70–99)
Potassium: 4.4 mmol/L (ref 3.5–5.1)
Sodium: 125 mmol/L — ABNORMAL LOW (ref 135–145)

## 2018-09-01 LAB — CBC
HEMATOCRIT: 26.3 % — AB (ref 36.0–46.0)
HEMOGLOBIN: 7.8 g/dL — AB (ref 12.0–15.0)
MCH: 28.9 pg (ref 26.0–34.0)
MCHC: 29.7 g/dL — ABNORMAL LOW (ref 30.0–36.0)
MCV: 97.4 fL (ref 80.0–100.0)
Platelets: 147 10*3/uL — ABNORMAL LOW (ref 150–400)
RBC: 2.7 MIL/uL — ABNORMAL LOW (ref 3.87–5.11)
RDW: 17.3 % — ABNORMAL HIGH (ref 11.5–15.5)
WBC: 2.9 10*3/uL — ABNORMAL LOW (ref 4.0–10.5)
nRBC: 0 % (ref 0.0–0.2)

## 2018-09-01 MED ORDER — CLONIDINE HCL 0.1 MG/24HR TD PTWK: 0.1000 mg | MEDICATED_PATCH | TRANSDERMAL | Status: DC

## 2018-09-01 NOTE — Care Management Note (Addendum)
Case Management Note  Patient Details  Name: [name] MRN: [id number] Date of Birth: [birth year]  Subjective/Objective: Pt presented for Sepsis, Weakness and low Hemoglobin- HD TTS. PTA from [place]- long term resident.                 Action/Plan: CSW is following for disposition needs. CM will continue to monitor for transition of care needs as well.   Expected Discharge Date:                  Expected Discharge Plan:  Skilled Nursing Facility  In-House Referral:  Clinical Social Work  Discharge planning Services  CM Consult  Post Acute Care Choice:  NA Choice offered to:  NA  DME Arranged:  N/A DME Agency:  NA  HH Arranged:  NA HH Agency:  NA  Status of Service:  Completed, signed off  If discussed at [place] of Stay Meetings, dates discussed:    Additional Comments: [place] 09-02-18 Jacqlyn Krauss, RN,BSN [phone] Plan to return to [place] with disposition needs.  Bethena Roys, RN 09/01/2018, 1:40 PM

## 2018-09-01 NOTE — Progress Notes (Signed)
PROGRESS NOTE    [name]  FOY:[id number] DOB: 03-02-1941 DOA: 08/25/2018 PCP: Hendricks Limes, MD  Brief Narrative: [name] is a 77 year old female with history of ESRD on hemodialysis, mild dementia with cognitive deficits and dysphagia, multiple myeloma on chemotherapy, chronic anemia recently hospitalized with sepsis, gram-negative bacteremia with Achromobacter species, she was treated with IV meropenem, source was not found, discharged to skilled nursing facility to complete a 2-week course of IV meropenem on 11/29. -Presented to the emergency room late 11/25 night with weakness, mild hypotension, hemoglobin of 7 and a sodium of 121 -She is a very poor historian unable to provide any corroborative history, she was given 2 units of PRBCs on admission, was found to be heme positive -Blood Cx from 11/25 night grew multidrug-resistant Achromobacter, infectious disease consulting -Hemodialysis access issues, unable to cannulate left arm AV fistula,  -Underwent right IJ HD catheter placement in radiology 11/29am -12/1 GI consulted, pt declined colonoscopy, plan for Ba enema -12/2: declined prep initially and then changed her mind  Assessment & Plan:   Recurrent Achromobacter bacteremia -No clear source, she is immunosuppressed secondary to multiple myeloma, chemotherapy, hypogammaglobulinemia -Left arm AV fistula site without overt signs or symptoms of infection -recently treated for same, with IV Meropenem x10days prior to this admission -Achromobacter from 11/25 is multidrug-resistant, IV meropenem was switched to Zosyn 11/28 -appreciate infectious disease input  -Repeat blood cultures from 11/28 negative so far  -2D echocardiogram -did not show any evidence of vegetations -2-week course of IV antibiotics recommended, last day is 12/11, will be transitioned to ceftazidime with dialysis to complete course -stable  Anemia-acute on chronic, multifactorial -Admitted with hemoglobin of  7.1 this is multifactorial, secondary to chronic kidney disease, multiple myeloma, chemotherapy and some low-grade GI bleed as well, was Hemoccult positive in the emergency room -transfused 2 units of PRBC on 11/27 -Hemoglobin stable now -In the setting of recurrent gram-negative bacteremia with unclear source and mild GI bleed, ideally needs endoscopic evaluation, with patient being a SNF resident, cognitive deficits and dialysis, quite complex to get this scheduled as outpatient, discussed with [facility] gastroenterology, patient was seen she declined a colonoscopy, unable to do a virtual colonoscopy inpatient, plan for BA enema -appreciate GI input  ESRD on hemodialysis -Nephrology following, significant difficulties with dialysis access and cannulating left arm AV fistula  -s/p temporary dialysis catheter in IR  -Dialysis  per renal  Mild dementia with cognitive dysfunction -CT head unremarkable  History of multiple myeloma -Refractory, followed at [facility] -Had been on carfilzomib and dexamethasone per oncology -Valacyclovir for prophylaxis  Bradycardia -asymptomatic, DC clonidine  Goals of care -Chronically ill elderly female with mild cognitive deficits with ESRD, refractory myeloma, freq admissions, HD access problems. Palliative medicine consulted, daughter is decision maker and is adamant about Full Code and Full aggressive scope of Rx  DVT prophylaxis: SCds Code Status: Full Code Family Communication: no family at bedside Disposition Plan: SNF when workup complete  Consultants:   Nephrology  ID  Equal GI   Procedures:   Antimicrobials:    Subjective: -upset about low HR, no symptoms from this  -initially declined prep now drinking it  Objective: Vitals:   09/01/18 0419 09/01/18 0900 09/01/18 1114 09/01/18 1133  BP: (!) 147/80 139/73 (!) 172/88 (!) 148/82  Pulse: (!) 39 (!) 34 (!) 35   Resp: _0 Temp: 98.7 F (37.1 C) 97.6 F (36.4 C)    TempSrc: Oral  Oral  SpO2: 100% 100% 100%   Weight: 62.7 kg     Height:        Intake/Output Summary (Last 24 hours) at 09/01/2018 1326 Last data filed at 09/01/2018 1000 Gross per 24 hour  Intake 4090.13 ml  Output -  Net 4090.13 ml   Filed Weights   08/30/18 0744 08/30/18 1130 09/01/18 0419  Weight: 61.3 kg 59.3 kg 62.7 kg    Examination:  Gen: elderly frail female, sitting up in bed, no distress HEENT: PERRLA, Neck supple, no JVD Lungs:CTAB, R IJ HD catheter CVS: S1S2/brady Abd: soft, Non tender, non distended, BS present Extremities: No edema, L arm AVF Skin: no new rashes  Data Reviewed:   CBC: Recent Labs  Lab 08/25/18 1607 08/25/18 2301  08/27/18 1602 08/28/18 0409 08/29/18 0420 08/30/18 0806 09/01/18 0352  WBC 3.9* 3.7*   < > 3.6* 4.0 2.7* 3.3* 2.9*  NEUTROABS 2.9 2.6  --   --   --   --   --   --   HGB 7.1* 7.1*   < > 9.0* 8.9* 8.7* 8.4* 7.8*  HCT 23.6* 23.3*   < > 27.4* 27.5* 28.6* 27.0* 26.3*  MCV 100.4* 99.6   < > 92.6 92.0 94.4 98.2 97.4  PLT 234 228   < > 230 228 199 169 147*   < > = values in this interval not displayed.   Basic Metabolic Panel: Recent Labs  Lab 08/28/18 0409 08/28/18 0850 08/29/18 1106 08/30/18 0806 09/01/18 0352  NA 116* 118* 127* 125* 125*  K 5.3* 5.2* 4.2 4.4 4.4  CL 84* 85* 95* 89* 89*  CO2 20* 20* _0 GLUCOSE 71 82 69* 115* 77  BUN 55* 56* 24* 31* 22  CREATININE 11.54* 11.47* 7.50* 8.42* 6.36*  CALCIUM 8.2* 8.1* 8.3* 8.4* 8.7*  PHOS  --   --   --  5.4*  --    GFR: Estimated Creatinine Clearance: 5.9 mL/min (A) (by C-G formula based on SCr of 6.36 mg/dL (H)). Liver Function Tests: Recent Labs  Lab 08/25/18 1607 08/28/18 0409 08/30/18 0806  AST 16 15  --   ALT 8 7  --   ALKPHOS 44 42  --   BILITOT 0.8 0.7  --   PROT 8.2* 7.4  --   ALBUMIN 2.7* 2.3* 2.1*   No results for input(s): LIPASE, AMYLASE in the last 168 hours. No results for input(s): AMMONIA in the last 168 hours. Coagulation Profile: Recent Labs    Lab 08/25/18 1607  INR 1.09   Cardiac Enzymes: No results for input(s): CKTOTAL, CKMB, CKMBINDEX, TROPONINI in the last 168 hours. BNP (last 3 results) No results for input(s): PROBNP in the last 8760 hours. HbA1C: No results for input(s): HGBA1C in the last 72 hours. CBG: No results for input(s): GLUCAP in the last 168 hours. Lipid Profile: No results for input(s): CHOL, HDL, LDLCALC, TRIG, CHOLHDL, LDLDIRECT in the last 72 hours. Thyroid Function Tests: No results for input(s): TSH, T4TOTAL, FREET4, T3FREE, THYROIDAB in the last 72 hours. Anemia Panel: No results for input(s): VITAMINB12, FOLATE, FERRITIN, TIBC, IRON, RETICCTPCT in the last 72 hours. Urine analysis:    Component Value Date/Time   COLORURINE YELLOW 08/12/2018 2025   APPEARANCEUR TURBID (A) 08/12/2018 2025   LABSPEC 1.010 08/12/2018 2025   PHURINE 8.0 08/12/2018 2025   GLUCOSEU 50 (A) 08/12/2018 2025   HGBUR MODERATE (A) 08/12/2018 2025   BILIRUBINUR NEGATIVE 08/12/2018 2025   KETONESUR NEGATIVE 08/12/2018 2025  PROTEINUR 100 (A) 08/12/2018 2025   UROBILINOGEN 0.2 04/10/2015 1800   NITRITE NEGATIVE 08/12/2018 2025   LEUKOCYTESUR LARGE (A) 08/12/2018 2025   Sepsis Labs: _0 (procalcitonin:4,lacticidven:4)  ) Recent Results (from the past 240 hour(s))  Culture, blood (routine x 2)     Status: Abnormal   Collection Time: 08/25/18  4:04 PM  Result Value Ref Range Status   Specimen Description BLOOD RIGHT FOREARM  Final   Special Requests   Final    BOTTLES DRAWN AEROBIC ONLY Blood Culture adequate volume   Culture  Setup Time   Final    AEROBIC BOTTLE ONLY GRAM NEGATIVE RODS CRITICAL VALUE NOTED.  VALUE IS CONSISTENT WITH PREVIOUSLY REPORTED AND CALLED VALUE.    Culture (A)  Final    ACHROMOBACTER SPECIES SUSCEPTIBILITIES PERFORMED ON PREVIOUS CULTURE WITHIN THE LAST 5 DAYS. Performed at [facility], [place] [address], [place], [place] [address]    Report Status 08/29/2018 FINAL  Final   Culture, blood (routine x 2)     Status: Abnormal   Collection Time: 08/25/18  4:09 PM  Result Value Ref Range Status   Specimen Description BLOOD RIGHT ANTECUBITAL  Final   Special Requests   Final    BOTTLES DRAWN AEROBIC AND ANAEROBIC Blood Culture adequate volume   Culture  Setup Time   Final    AEROBIC BOTTLE ONLY GRAM NEGATIVE RODS CRITICAL RESULT CALLED TO, READ BACK BY AND VERIFIED WITH: Diona Browner PHARMD 08/26/18 1825 JDW Performed at [facility], [place] [address] [place], [place] [address]    Culture ACHROMOBACTER SPECIES (A)  Final   Report Status 08/28/2018 FINAL  Final   Organism ID, Bacteria ACHROMOBACTER SPECIES  Final      Susceptibility   Achromobacter species - MIC*    CEFEPIME 16 INTERMEDIATE Intermediate     CEFAZOLIN >=64 RESISTANT Resistant     GENTAMICIN >=16 RESISTANT Resistant     CIPROFLOXACIN >=4 RESISTANT Resistant     IMIPENEM 8 INTERMEDIATE Intermediate     TRIMETH/SULFA 160 RESISTANT Resistant     * ACHROMOBACTER SPECIES  Culture, blood (routine x 2)     Status: None (Preliminary result)   Collection Time: 08/28/18  8:42 AM  Result Value Ref Range Status   Specimen Description BLOOD RIGHT ANTECUBITAL  Final   Special Requests   Final    BOTTLES DRAWN AEROBIC AND ANAEROBIC Blood Culture adequate volume   Culture   Final    NO GROWTH 4 DAYS Performed at [facility], [place] [address], [place], [place] [address]    Report Status PENDING  Incomplete  Culture, blood (routine x 2)     Status: None (Preliminary result)   Collection Time: 08/28/18  8:42 AM  Result Value Ref Range Status   Specimen Description BLOOD BLOOD RIGHT FOREARM  Final   Special Requests   Final    BOTTLES DRAWN AEROBIC AND ANAEROBIC Blood Culture adequate volume   Culture  Setup Time   Final    GRAM NEGATIVE RODS AEROBIC BOTTLE ONLY CRITICAL VALUE NOTED.  VALUE IS CONSISTENT WITH PREVIOUSLY REPORTED AND CALLED VALUE. Performed at [facility], [place] [address], [place], [place] [address]    Culture GRAM NEGATIVE RODS  Final   Report Status PENDING  Incomplete         Radiology Studies: No results found.      Scheduled Meds: . amLODipine  10 mg Oral QPM  . calcium acetate  1,334 mg Oral TID WC  . Chlorhexidine Gluconate Cloth  6 each Topical Q0600  . famotidine  20 mg Oral Daily  . isosorbide mononitrate  60 mg Oral Daily  . lamoTRIgine  50 mg Oral Daily  . latanoprost  1 drop Both Eyes QHS  . LORazepam  0.5 mg Oral QHS  . losartan  50 mg Oral QPM  . multivitamin  1 tablet Oral QHS  . sevelamer carbonate  1,600 mg Oral TID WC  . simethicone  120 mg Oral TID   Continuous Infusions: . piperacillin-tazobactam (ZOSYN)  IV 3.375 g (09/01/18 0356)     LOS: 6 days    Time spent: 37mn    PDomenic Polite MD [facility] Page via [email or web address], password [email or web address] After 7PM please contact night-coverage  09/01/2018, 1:26 PM

## 2018-09-01 NOTE — Progress Notes (Addendum)
Reported by night shift nurse that pt did not drink her bowel prep.  Pt stated she did not want to have a test done, so she did not want to drink the prep.  Her stool is still brown and formed.  Dr. Alessandra Bevels and radiology department made aware.  The test has been cancelled.  Idolina Primer, RN

## 2018-09-01 NOTE — Progress Notes (Signed)
On call [facility] doctor pager concerning pt' s bradycardia hr was as low as 34 bpm pt was alert and aware that her heart rate was low pt refused to continue to drink her bowel prep stating "my hr is to low and im not taking that test." Reassurance  provided   Discussed with pt that the Importance of the the bowel prep so that the doctor can visionalized  and identify the source of her bleeding. Still awaiting a return call from MD Charge nurse informed

## 2018-09-01 NOTE — Progress Notes (Signed)
[facility] Progress Note   Subjective:  Seen in room, making her way through her bowel prep (per notes refused overnight). No CP/dyspnea or other new symptoms today.  Objective Vitals:   08/31/18 1611 08/31/18 2004 08/31/18 2354 09/01/18 0419  BP: 137/85 133/80 130/81 (!) 147/80  Pulse: (!) 46 (!) 44 (!) 36 (!) 39  Resp: 13 11  12   Temp: 98.9 F (37.2 C) 98.9 F (37.2 C)  98.7 F (37.1 C)  TempSrc: Oral Oral  Oral  SpO2: 100% 100%  100%  Weight:    62.7 kg  Height:       Physical Exam General: Well appearing elderly woman, NAD Heart: RRR; 2/6 systolic murmur Lungs: CTAB Abdomen: soft, non-tender Extremities: No LE edema Dialysis Access: TDC in R chest  Additional Objective Labs: Basic Metabolic Panel: Recent Labs  Lab 08/29/18 1106 08/30/18 0806 09/01/18 0352  NA 127* 125* 125*  K 4.2 4.4 4.4  CL 95* 89* 89*  CO2 24 25 28   GLUCOSE 69* 115* 77  BUN 24* 31* 22  CREATININE 7.50* 8.42* 6.36*  CALCIUM 8.3* 8.4* 8.7*  PHOS  --  5.4*  --    Liver Function Tests: Recent Labs  Lab 08/25/18 1607 08/28/18 0409 08/30/18 0806  AST 16 15  --   ALT 8 7  --   ALKPHOS 44 42  --   BILITOT 0.8 0.7  --   PROT 8.2* 7.4  --   ALBUMIN 2.7* 2.3* 2.1*   CBC: Recent Labs  Lab 08/25/18 1607 08/25/18 2301  08/27/18 1602 08/28/18 0409 08/29/18 0420 08/30/18 0806 09/01/18 0352  WBC 3.9* 3.7*   < > 3.6* 4.0 2.7* 3.3* 2.9*  NEUTROABS 2.9 2.6  --   --   --   --   --   --   HGB 7.1* 7.1*   < > 9.0* 8.9* 8.7* 8.4* 7.8*  HCT 23.6* 23.3*   < > 27.4* 27.5* 28.6* 27.0* 26.3*  MCV 100.4* 99.6   < > 92.6 92.0 94.4 98.2 97.4  PLT 234 228   < > 230 228 199 169 147*   < > = values in this interval not displayed.   Medications: . piperacillin-tazobactam (ZOSYN)  IV 3.375 g (09/01/18 0356)   . amLODipine  10 mg Oral QPM  . calcium acetate  1,334 mg Oral TID WC  . Chlorhexidine Gluconate Cloth  6 each Topical Q0600  . famotidine  20 mg Oral Daily  . isosorbide  mononitrate  60 mg Oral Daily  . lamoTRIgine  50 mg Oral Daily  . latanoprost  1 drop Both Eyes QHS  . LORazepam  0.5 mg Oral QHS  . losartan  50 mg Oral QPM  . multivitamin  1 tablet Oral QHS  . sevelamer carbonate  1,600 mg Oral TID WC  . simethicone  120 mg Oral TID    Dialysis Orders: TTS at AF 3.5hr, BFR 425, DFRA1.5, EDW 58kg, 2K/2.25Ca, AVF, no heparin - Hectoral 35mcg IV q HD - Epo 18,000 units IV q HD - Attends all HD sessions, but signs off early frequently  Assessment/Plan: 1. Sepsis/Achromobacter bacteremia: Recurrent, same issue last mo. ID involved, on IV Zosyn then changed to Ceftazidime on d/c with HD (end date 12/11). 2. ESRD: Continue HD per TTS schedule. Next 12/3. 3. HTN/volume: Volume up on admit, improved now. 4. Anemia: Hgb 7.1 on admit with + FOBT. S/p 2U PRBCs 11/27. GI consulted, for barium enema. 5. Secondary hyperparathyroidism:  Labs ok. Continue Renvela/Ca Acetate. 6. HD access: L AVF clotted, unable to be salvaged. TDC placed. For new access once bacteremia fully treated. 7. AMS: On admit, improved now. Chronic underlying dementia. 8. Nutrition: Alb low, will need supplements after GI procedures.  Veneta Penton, PA-C 09/01/2018, 9:09 AM  [facility] Pager: [phone]

## 2018-09-01 NOTE — Progress Notes (Signed)
Patient ID: Ritika Hellickson, female   DOB: 1941-02-16, 77 y.o.   MRN: [id number]  This NP visited patient at the bedside as a follow up to  yesterday's [place].  Received phone call from patient's brother/ Charolotte Capuchin.  He is very concerned with the patient's long-term care plan and is helping to facilitate a meeting with the patient's daughter, his niece.  We discussed the patient's multiple comorbidities and anticipated care needs.  He shares with me his understanding of the patient's long-term poor prognosis and the decisions that the family are facing.  He tells me that recently he had to make similar decisions for his brother.  We discussed  the importance of continued conversation with family and the  medical providers regarding overall plan of care and treatment options,  ensuring decisions are within the context of the patients values and GOCs.  Questions and concerns addressed   Discussed with Dr Broadus John  Tentative meeting is set up for tomorrow.  Total time spent on the unit was 25 minutes  Greater than 50% of the time was spent in counseling and coordination of care  Wadie Lessen NP  Palliative Medicine Team Team Phone # [phone] Pager [phone]

## 2018-09-01 NOTE — Progress Notes (Signed)
[facility] Gastroenterology Progress Note  [name] 77 y.o. [birth year]   Subjective: Complaining of back pain and gas pains. Drinking colon prep. Denies abdominal pain.  Objective: Vital signs: Vitals:   09/01/18 0419 09/01/18 0900  BP: (!) 147/80 139/73  Pulse: (!) 39 (!) 34  Resp: 12 14  Temp: 98.7 F (37.1 C) 97.6 F (36.4 C)  SpO2: 100% 100%    Physical Exam: Gen: lethargic, elderly, no acute distress  HEENT: anicteric sclera CV: RRR Chest: CTA B Abd: soft, nontender,nondistended, +BS Ext: no edema  Lab Results: Recent Labs    08/30/18 0806 09/01/18 0352  NA 125* 125*  K 4.4 4.4  CL 89* 89*  CO2 25 28  GLUCOSE 115* 77  BUN 31* 22  CREATININE 8.42* 6.36*  CALCIUM 8.4* 8.7*  PHOS 5.4*  --    Recent Labs    08/30/18 0806  ALBUMIN 2.1*   Recent Labs    08/30/18 0806 09/01/18 0352  WBC 3.3* 2.9*  HGB 8.4* 7.8*  HCT 27.0* 26.3*  MCV 98.2 97.4  PLT 169 147*      Assessment/Plan: Anemia and heme positive stool - undergoing colon prep for barium enema. High risk for anesthesia with bradycardia. Conservative management. Will follow up on barium enema.   [name] 09/01/2018, 10:27 AM  Questions please call [phone] ID: [name], female   DOB: [birth year], 77 y.o.   MRN: [id number]

## 2018-09-02 LAB — CBC
HCT: 27.9 % — ABNORMAL LOW (ref 36.0–46.0)
Hemoglobin: 8.4 g/dL — ABNORMAL LOW (ref 12.0–15.0)
MCH: 29 pg (ref 26.0–34.0)
MCHC: 30.1 g/dL (ref 30.0–36.0)
MCV: 96.2 fL (ref 80.0–100.0)
Platelets: 158 10*3/uL (ref 150–400)
RBC: 2.9 MIL/uL — ABNORMAL LOW (ref 3.87–5.11)
RDW: 16.9 % — ABNORMAL HIGH (ref 11.5–15.5)
WBC: 3.1 10*3/uL — AB (ref 4.0–10.5)
nRBC: 0 % (ref 0.0–0.2)

## 2018-09-02 LAB — CULTURE, BLOOD (ROUTINE X 2)
CULTURE: NO GROWTH
SPECIAL REQUESTS: ADEQUATE
SPECIAL REQUESTS: ADEQUATE

## 2018-09-02 LAB — BASIC METABOLIC PANEL
Anion gap: 10 (ref 5–15)
BUN: 27 mg/dL — ABNORMAL HIGH (ref 8–23)
CO2: 26 mmol/L (ref 22–32)
Calcium: 8.4 mg/dL — ABNORMAL LOW (ref 8.9–10.3)
Chloride: 89 mmol/L — ABNORMAL LOW (ref 98–111)
Creatinine, Ser: 7.21 mg/dL — ABNORMAL HIGH (ref 0.44–1.00)
GFR calc non Af Amer: 5 mL/min — ABNORMAL LOW (ref >60)
GFR, EST AFRICAN AMERICAN: 6 mL/min — AB (ref >60)
Glucose, Bld: 77 mg/dL (ref 70–99)
Potassium: 4.6 mmol/L (ref 3.5–5.1)
Sodium: 125 mmol/L — ABNORMAL LOW (ref 135–145)

## 2018-09-02 MED ORDER — CEFTAZIDIME 2 G IJ SOLR: 2.0000 g | INTRAMUSCULAR | Status: AC | PRN

## 2018-09-02 MED ORDER — HEPARIN SODIUM (PORCINE) 1000 UNIT/ML IJ SOLN
INTRAMUSCULAR | Status: AC
  Administered 2018-09-02: 1000 [IU]
  Filled 2018-09-02: qty 4

## 2018-09-02 MED ORDER — PRO-STAT SUGAR FREE PO LIQD: 30.0000 mL | Freq: Two times a day (BID) | ORAL | Status: DC

## 2018-09-02 NOTE — Progress Notes (Signed)
[facility] Gastroenterology Progress Note  [name] 77 y.o. 12-Apr-1941   Subjective: Feels ok. Denies abdominal pain. Nursing students in room.  Objective: Vital signs: Vitals:   09/02/18 0400 09/02/18 0824  BP: (!) 142/85 134/79  Pulse: (!) 42 (!) 45  Resp: 16 15  Temp: 97.9 F (36.6 C) 98.4 F (36.9 C)  SpO2: 99% 100%    Physical Exam: Gen: lethargic, elderly, thin, no acute distress  HEENT: anicteric sclera CV: RRR Chest: CTA B Abd: soft, nontender, nondistended, +BS   Lab Results: Recent Labs    09/01/18 0352 09/02/18 0418  NA 125* 125*  K 4.4 4.6  CL 89* 89*  CO2 28 26  GLUCOSE 77 77  BUN 22 27*  CREATININE 6.36* 7.21*  CALCIUM 8.7* 8.4*   No results for input(s): AST, ALT, ALKPHOS, BILITOT, PROT, ALBUMIN in the last 72 hours. Recent Labs    09/01/18 0352 09/02/18 0418  WBC 2.9* 3.1*  HGB 7.8* 8.4*  HCT 26.3* 27.9*  MCV 97.4 96.2  PLT 147* 158      Assessment/Plan: Anemia - barium enema negative for mass or obstruction. No further GI workup planned. Conservative management. F/U with GI prn. Will sign off. Call if questions.   [name] 09/02/2018, 9:15 AM  Questions please call [phone] ID: [name], female   DOB: [birth year], 77 y.o.   MRN: [id number]

## 2018-09-02 NOTE — Clinical Social Work Placement (Signed)
   CLINICAL SOCIAL WORK PLACEMENT  NOTE  Date:  09/02/2018  Patient Details  Name: [name] MRN: [id number] Date of Birth: Sep 10, 1941  Clinical Social Work is seeking post-discharge placement for this patient at the [place] level of care (*CSW will initial, date and re-position this form in  chart as items are completed):  Yes   Patient/family provided with [facility] Work Department's list of facilities offering this level of care within the geographic area requested by the patient (or if unable, by the patient's family).  Yes   Patient/family informed of their freedom to choose among providers that offer the needed level of care, that participate in Medicare, Medicaid or managed care program needed by the patient, have an available bed and are willing to accept the patient.  Yes   Patient/family informed of [facility]'s ownership interest in [facility] and [facility], as well as of the fact that they are under no obligation to receive care at these facilities.  PASRR submitted to EDS on       PASRR number received on       Existing PASRR number confirmed on 08/29/18     FL2 transmitted to all facilities in geographic area requested by pt/family on 08/29/18     FL2 transmitted to all facilities within larger geographic area on       Patient informed that his/her managed care company has contracts with or will negotiate with certain facilities, including the following:  [facility]     Yes   Patient/family informed of bed offers received.  Patient chooses bed at [facility] recommends and patient chooses bed at      Patient to be transferred to [facility] on 09/02/18.  Patient to be transferred to facility by [facility]     Patient family notified on 09/02/18 of transfer.  Name of family member notified:  [name], daughter     PHYSICIAN Please prepare priority  discharge summary, including medications, Please prepare prescriptions     Additional Comment:    _______________________________________________ Estanislado Emms, LCSW 09/02/2018, 11:00 AM

## 2018-09-02 NOTE — Progress Notes (Signed)
CSW met with patient, daughter, and grandson at bedside, at patient's request. Patient had concerns about going back to [facility], stated "Who is going to help me at [facility], I can't do things myself." CSW reassured patient that the staff at [facility] would continue to support her with care needs.  Per RN, patient likely ready for discharge today after HD. CSW to follow and support with discharge back to [facility].  Estanislado Emms, [place]

## 2018-09-02 NOTE — Progress Notes (Addendum)
Patient will discharge back to [facility]. Anticipated discharge date: 09/02/18 Family notified: Ayra Hodgdon, daughter Transportation by: Corey Harold - pickup scheduled for 5:30 pm  Nurse to call report to [phone].  CSW signing off.  Estanislado Emms, [place]  Clinical Social Worker

## 2018-09-02 NOTE — Discharge Summary (Addendum)
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 Mild dementia/cognitive deficits Multifactorial anemia   Hypertensive urgency   Hyponatremia   ESRD needing dialysis [name])   Neurocognitive deficits   Gram-negative bacteremia   Weakness generalized   Anemia   Hypothermia   End-stage renal disease on hemodialysis ([name])   Hemodialysis catheter infection ([name])   DNR (do not resuscitate) discussion   Palliative care by specialist   Discharge Condition: Stable  Diet recommendation: Renal diet  Filed Weights   09/01/18 0419 09/02/18 0400 09/02/18 1315  Weight: 62.7 kg 61.6 kg 61.5 kg    History of present illness:  [name] is a 77 year old female with history of ESRD on hemodialysis, mild dementia with cognitive deficits and dysphagia, multiple myeloma on chemotherapy, chronic anemia recently hospitalized with sepsis, gram-negative bacteremia with Achromobacter species, she was treated with IV meropenem, source was not found, discharged to skilled nursing facility to complete a 2-week course of IV meropenem on 11/29. 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, chemotherapy, hypogammaglobulinemia -Left arm AV fistula site without overt  signs or symptoms of infection -recently treated for same bacteremia with IV Meropenem x10days prior to this admission -Achromobacter from 11/25 is multidrug-resistant, IV meropenem was switched to Zosyn 11/28 -infectious disease was consulted -Repeat blood cultures from 11/28 negative so far  -2D echocardiogram -did not show any evidence of vegetations - infectious disease recommended 2-week course of IV antibiotics, remained inpatient on IV Zosyn, at discharge transition to IV ceftazidime with dialysis to complete antibiotic course on 12/11 -stable  Anemia-acute on chronic, multifactorial -Admitted with hemoglobin of 7.1 this is multifactorial, secondary to chronic kidney disease, multiple myeloma, chemotherapy and some low-grade GI bleed as well, was Hemoccult positive in the emergency room -transfused 2 units of PRBC on 11/27 -Hemoglobin stable now in the 8-9 range -In the setting of recurrent gram-negative bacteremia with unclear source and mild GI bleed, ideally needed endoscopic evaluation, with patient being a SNF resident, cognitive deficits and dialysis, was challenging to get this scheduled as outpatient, discussed with [name], patient was seen she declined a colonoscopy, unable to do a virtual colonoscopy inpatient, completed barium enema yesterday which did not show any gross lesions in her colon  -No plan for further GI work-up -Continue iron and Aranesp with dialysis  ESRD on hemodialysis -Nephrology following, significant difficulties with dialysis access and cannulating left arm AV fistula  -s/p temporary dialysis catheter in IR  -Continued hemodialysis inpatient per renal -Follow-up in the dialysis center Tuesday Thursday Saturday  Mild dementia with cognitive dysfunction -CT head unremarkable  History of multiple myeloma -Refractory, followed at [name] -Had been on carfilzomib and dexamethasone per oncology -Valacyclovir for  prophylaxis  Bradycardia -asymptomatic, DC clonidine  Goals of care -Chronically ill elderly female with mild cognitive deficits with ESRD, refractory myeloma, freq admissions, HD access problems. Palliative medicine consulted, daughter is decision maker  and is adamant about Full Code and Full aggressive scope of Rx -Recommend palliative care follow-up at SNF   Procedures -Right IJ HD catheter placement in radiology on 11/29  Discharge Exam: Vitals:   09/02/18 1310 09/02/18 1315  BP: (!) 179/102 (!) 174/106  Pulse: (!) 50 (!) 50  Resp: 15 16  Temp: 98.4 F (36.9 C) 98.4 F (36.9 C)  SpO2: 99% 100%    General: AAOx2 Cardiovascular: S1S2/RRR Respiratory: CTAB  Discharge Instructions    Allergies as of 09/02/2018      Reactions   Phenergan [promethazine Hcl] Other (See Comments)   Acute encephalopathy in the context of refusal to go to hemodialysis and administration of Phenergan for nausea and vomiting      Medication List    STOP taking these medications   cloNIDine 0.2 mg/24hr patch Commonly known as:  CATAPRES - Dosed in mg/24 hr   meropenem  IVPB Commonly known as:  MERREM     TAKE these medications   acetaminophen 325 MG tablet Commonly known as:  TYLENOL Take 650 mg by mouth every 6 (six) hours as needed for fever or headache (pain). Do not exceed 3000 mg in 24 hours   amLODipine 10 MG tablet Commonly known as:  NORVASC Take 10 mg by mouth every evening. Hold for SBP <100MM/HG   bisacodyl 10 MG suppository Commonly known as:  DULCOLAX Place 10 mg rectally daily as needed for moderate constipation (constipation not relieved by MOM).   calcium acetate 667 MG capsule Commonly known as:  PHOSLO Take 1,334 mg by mouth 3 (three) times daily with meals.   cefTAZidime 2 g injection Commonly known as:  FORTAZ Inject 2 g into the muscle every dialysis for up to 8 days.   cloNIDine 0.1 MG tablet Commonly known as:  CATAPRES Take 0.1 mg by mouth every  8 (eight) hours as needed (sbp >180).   FLEET ENEMA RE Place 1 enema rectally daily as needed (constipation not relieved by bisacodyl suppository).   hydrocortisone cream 1 % Apply 1 application topically daily as needed for itching.   isosorbide mononitrate 60 MG 24 hr tablet Commonly known as:  IMDUR Take 60 mg by mouth daily.   lamoTRIgine 25 MG tablet Commonly known as:  LAMICTAL Take 50 mg by mouth daily.   latanoprost 0.005 % ophthalmic solution Commonly known as:  XALATAN Place 1 drop into both eyes at bedtime.   loperamide 2 MG tablet Commonly known as:  IMODIUM A-D Take 4 mg by mouth daily as needed (on Dialysis days).   LORazepam 0.5 MG tablet Commonly known as:  ATIVAN Take 0.5 mg by mouth at bedtime.   losartan 50 MG tablet Commonly known as:  COZAAR Take 50 mg by mouth every evening.   multivitamin Tabs tablet Take 1 tablet by mouth daily.   ranitidine 300 MG tablet Commonly known as:  ZANTAC Take 300 mg by mouth at bedtime. Reported on 03/05/2016   sertraline 50 MG tablet Commonly known as:  ZOLOFT Take 50 mg by mouth daily.   sevelamer carbonate 800 MG tablet Commonly known as:  RENVELA Take 2 tablets (1,600 mg total) by mouth 3 (three) times daily with meals.   simethicone 125 MG chewable tablet Commonly known as:  MYLICON Chew 017 mg by mouth daily. 6PM   valACYclovir 500 MG tablet Commonly known as:  VALTREX Take 1 tablet (500 mg total) by mouth every other day.      Allergies  Allergen Reactions  .  Phenergan [Promethazine Hcl] Other (See Comments)    Acute encephalopathy in the context of refusal to go to hemodialysis and administration of Phenergan for nausea and vomiting      The results of significant diagnostics from this hospitalization (including imaging, microbiology, ancillary and laboratory) are listed below for reference.    Significant Diagnostic Studies: Dg Chest 2 View  Result Date: 08/25/2018 CLINICAL DATA:  Altered  mental status EXAM: CHEST - 2 VIEW COMPARISON:  Chest x-rays dated 08/12/2018 and 06/26/2017 FINDINGS: Stable cardiomegaly. Lungs are clear. No pleural effusion or pneumothorax seen. Osseous structures about the chest are unremarkable. IMPRESSION: 1. No active cardiopulmonary disease. No evidence of pneumonia or pulmonary edema. 2. Stable cardiomegaly. Electronically Signed   By: Franki Cabot M.D.   On: 08/25/2018 17:23   Dg Wrist 2 Views Left  Result Date: 08/14/2018 CLINICAL DATA:  Left wrist and hand pain. EXAM: LEFT WRIST - 2 VIEW COMPARISON:  08/12/2018. FINDINGS: Diffuse osteopenia degenerative change. Erosive changes again noted about the distal scaphoid and proximal trapezium. Again possibly related to prior surgery and or erosive arthritis. Clinical correlation suggested. No evidence of fracture dislocation. Diffuse soft tissue swelling. No radiopaque foreign body. Peripheral vascular calcification. IMPRESSION: 1. Diffuse soft tissue swelling. No evidence of fracture dislocation. 2. Erosive changes are again noted about the distal scaphoid and proximal trapezium. Again this could be rib possibly related to prior surgery and or erosive arthritis. Similar findings noted on prior exam. 3.  Peripheral vascular disease. Electronically Signed   By: Marcello Moores  Register   On: 08/14/2018 12:46   Dg Wrist Complete Left  Result Date: 08/12/2018 CLINICAL DATA:  Acute onset of left wrist swelling and erythema. Initial encounter. EXAM: LEFT WRIST - COMPLETE 3+ VIEW COMPARISON:  None. FINDINGS: There is no evidence of fracture or dislocation. There appears to be large erosions of the distal scaphoid and proximal trapezium and trapezoid. This may reflect prior surgery, or possibly erosive arthritis. Would correlate with the patient's history. Scattered vascular calcifications are seen. IMPRESSION: 1. No evidence of fracture or dislocation. 2. Apparent large erosions of the distal scaphoid and proximal trapezium and  trapezoid. This may reflect prior surgery, or possibly erosive arthritis. Would correlate with the patient's history. 3. Scattered vascular calcifications seen. Electronically Signed   By: Garald Balding M.D.   On: 08/12/2018 21:29   Ct Head Wo Contrast  Result Date: 08/25/2018 CLINICAL DATA:  New onset weakness after dialysis. Hypotension. Slurred speech. EXAM: CT HEAD WITHOUT CONTRAST TECHNIQUE: Contiguous axial images were obtained from the base of the skull through the vertex without intravenous contrast. COMPARISON:  CT head without contrast 07/26/2018 FINDINGS: Brain: Moderate atrophy and white matter is similar to the prior exam. No acute infarct, hemorrhage, or mass lesion is present. Diffuse hypoattenuation is present throughout the basal ganglia. A remote lacunar infarct is again seen in the left cerebellum. Ventricles are proportionate to the degree of atrophy. No significant extra-axial fluid collection present. Vascular: Atherosclerotic calcifications are present within the cavernous internal carotid arteries bilaterally. There is no hyperdense vessel. Skull: Multiple lucent lesions are again noted. There is no significant interval change. Findings are consistent with known multiple myeloma. There is a focal lesion involving the left arch of C1. Sinuses/Orbits: The paranasal sinuses and mastoid air cells are clear. Globes and orbits are within normal limits. IMPRESSION: 1. Stable appearance of atrophy and white matter disease. 2. No acute intracranial abnormality. 3. Multiple lucent lesions in the calvarium and left-sided arch of  C1 consistent with known multiple myeloma. There is no significant progression. Electronically Signed   By: San Morelle M.D.   On: 08/25/2018 17:22   Dg Hand 2 View Left  Result Date: 08/14/2018 CLINICAL DATA:  Left wrist and hand pain. EXAM: LEFT HAND - 2 VIEW COMPARISON:  08/12/2018. FINDINGS: Diffuse soft tissue swelling. No radiopaque foreign body. Again  noted erosive changes about the distal scaphoid and proximal trapezium. This could be related to prior surgery. Erosive arthropathy could present this fashion. Similar findings noted on prior exam. No evidence of fracture or dislocation. IMPRESSION: 1. Diffuse soft tissue swelling. No radiopaque foreign body. No evidence of fracture dislocation. 2. Again noted are erosive changes about the distal scaphoid and proximal trapezium. This could be related to prior surgery. Erosive arthropathy including infectious arthropathy could also present in this fashion. 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 Daryll Brod, MD Guidance:  Ultrasound and fluoroscopic FLUOROSCOPY TIME:  Fluoroscopy Time: 1 minutes 0 seconds (2 mGy). MEDICATIONS: Ancef 2 g administered within 1 hour of the procedure ANESTHESIA/SEDATION: Versed 0.5 mg IV; Fentanyl 25 mcg IV; Moderate Sedation Time:  17 minutes The patient was continuously monitored during the procedure by the interventional radiology nurse under my direct supervision. CONTRAST:  None. COMPLICATIONS: None immediate. PROCEDURE: Informed consent was obtained from the patient following explanation of the procedure, risks, benefits and alternatives. The patient understands, agrees and consents for the procedure. All questions were addressed. A time out was performed. Maximal barrier sterile technique utilized including caps, mask, sterile gowns, sterile gloves, large sterile drape, hand hygiene, and 2% chlorhexidine scrub. Under sterile conditions and local anesthesia, right internal jugular  micropuncture venous access was performed with ultrasound. Images were obtained for documentation. A guide wire was inserted followed by a transitional dilator. Next, a 0.035 guidewire was advanced into the IVC with a 5-French catheter. Measurements were obtained from the right venotomy site to the proximal right atrium. In the right infraclavicular chest, a subcutaneous tunnel was created under sterile conditions and local anesthesia. 1% lidocaine with epinephrine was utilized for this. The 19 cm tip to cuff palindrome catheter was tunneled subcutaneously to the venotomy site and inserted into the SVC/RA junction through a valved peel-away sheath. Position was confirmed with fluoroscopy. Images were obtained for documentation. Blood was aspirated from the catheter followed by saline and heparin flushes. The appropriate volume and strength of heparin was instilled in each lumen. Caps were applied. The catheter was secured at the tunnel site with Gelfoam and a pursestring suture. The venotomy site was closed with subcuticular Vicryl suture. Dermabond was applied to the small right neck incision. A dry sterile dressing was applied. The catheter is ready for use. No immediate complications. IMPRESSION: Ultrasound and fluoroscopically guided right internal jugular tunneled hemodialysis catheter (19 cm tip to cuff palindrome catheter). Electronically Signed   By: Jerilynn Mages.  Shick M.D.   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 Daryll Brod, MD Guidance:  Ultrasound and fluoroscopic FLUOROSCOPY TIME:  Fluoroscopy Time: 1 minutes 0 seconds (2 mGy). MEDICATIONS: Ancef 2 g administered within 1 hour of the  procedure ANESTHESIA/SEDATION: Versed 0.5 mg IV; Fentanyl 25 mcg IV; Moderate Sedation Time:  17 minutes The patient was continuously monitored during the procedure by the interventional radiology nurse under my direct supervision. CONTRAST:  None. COMPLICATIONS: None immediate. PROCEDURE: Informed consent was obtained from the patient following explanation of the procedure, risks, benefits and alternatives. The patient understands, agrees and consents for the procedure. All questions were addressed. A time out was performed. Maximal barrier sterile technique utilized including caps, mask, sterile gowns, sterile gloves, large sterile drape, hand hygiene, and 2% chlorhexidine scrub. Under sterile conditions and local anesthesia, right internal jugular micropuncture venous access was performed with ultrasound. Images were obtained for documentation. A guide wire was inserted followed by a transitional dilator. Next, a 0.035 guidewire was advanced into the IVC with a 5-French catheter. Measurements were obtained from the right venotomy site to the proximal right atrium. In the right infraclavicular chest, a subcutaneous tunnel was created under sterile conditions and local anesthesia. 1% lidocaine with epinephrine was utilized for this. The 19 cm tip to cuff palindrome catheter was tunneled subcutaneously to the venotomy site and inserted into the SVC/RA junction through a valved peel-away sheath. Position was confirmed with fluoroscopy. Images were obtained for documentation. Blood was aspirated from the catheter followed by saline and heparin flushes. The appropriate volume and strength of heparin was instilled in each lumen. Caps were applied. The catheter was secured at the tunnel site with Gelfoam and a pursestring suture. The venotomy site was closed with subcuticular Vicryl suture. Dermabond was applied to the small right neck incision. A dry sterile dressing was applied. The catheter is ready for use. No  immediate complications. IMPRESSION: Ultrasound and fluoroscopically guided right internal jugular tunneled hemodialysis catheter (19 cm tip to cuff palindrome catheter). Electronically Signed   By: Jerilynn Mages.  Shick M.D.   On: 08/29/2018 09:47   Dg Chest Port 1 View  Result Date: 08/12/2018 CLINICAL DATA:  Acute onset of altered mental status. Status post dialysis. EXAM: PORTABLE CHEST 1 VIEW COMPARISON:  Chest radiograph performed 05/27/2018 FINDINGS: The lungs are well-aerated. Minimal bibasilar atelectasis is noted. There is no evidence of pleural effusion or pneumothorax. The cardiomediastinal silhouette is enlarged. No acute osseous abnormalities are seen. Clips are seen overlying the left axilla. IMPRESSION: 1. Minimal bibasilar atelectasis noted; lungs otherwise clear. 2. Cardiomegaly. Electronically Signed   By: Garald Balding M.D.   On: 08/12/2018 21:27   Dg Colon W/cm - Wo/w Kub  Result Date: 09/01/2018 CLINICAL DATA:  GI bleed, anemia EXAM: BE WITH CONTRAST - WITHOUT AND WITH KUB CONTRAST:  Barium FLUOROSCOPY TIME:  Fluoroscopy Time:  2 minutes, 18 seconds Radiation Exposure Index (if provided by the fluoroscopic device): 32.9 mGy Number of Acquired Spot Images: 3 COMPARISON:  None. FINDINGS: The initial KUB demonstrates adequate prep, without a significant amount of stool in the colon. Vascular calcifications noted. Probable scarring the lung bases. This patient was confused and has limited mobility. We were able to turn her prone and in the left lateral decubitus and right lateral decubitus positions. Turning her supine was not realistic. Some of our image sequences have the patient's arm across her belly, be reminded the patient multiple times to please move her arm by her sides. We were not  able to turn up the patient into a full lateral projection. I used balloon compression in order to flatten out portions of the colon for better sensitivity. However, given the patient's difficulty in cooperating  or turning, today's exam must be understood to have a reduced diagnostic sensitivity and specificity compared to a normal exam in a patient able to cooperate The barium column was advanced to tell barium into the terminal ileum. Upon compression fluoroscopy, scattered diverticula of the colon were observed without a concentrated appearance of diverticulosis. Patient was turned into decubitus positions in order to better characterize the bowel. There is some redundancy of the colon in the vicinity of the splenic flexure making it difficult to separate the loops of bowel. We were able to reflux the terminal ileum, which appears unremarkable. No appreciable colon mass is identified. No visualized filling defect in the barium column of the colon to favor a significant polyp. A post evacuation image of the abdomen demonstrates no appreciable filling defect, and shows filling of the colon. IMPRESSION: 1. No mass or significant polyp is identified. Please note that the patient had some confusion and limited mobility which does mildly reduced diagnostic sensitivity and specificity. 2. Scattered colonic diverticula. Electronically Signed   By: Van Clines M.D.   On: 09/01/2018 16:29   Dg Swallowing Func-speech Pathology  Result Date: 08/17/2018 Objective Swallowing Evaluation: Type of Study: MBS-Modified Barium Swallow Study  Patient Details Name: Lenell Mcconnell MRN: [id number] Date of Birth: May 15, 1941 Today's Date: 08/17/2018 Time: SLP Start Time (ACUTE ONLY): 23 -SLP Stop Time (ACUTE ONLY): 1155 SLP Time Calculation (min) (ACUTE ONLY): 25 min Past Medical History: Past Medical History: Diagnosis Date . Anxiety  . Closed fracture of right distal femur ([place]) 09/01/2015 . Depression  . ESRD (end stage renal disease) on dialysis [facility])   "TTS; [place] [facility]" (04/24/2017) . GERD (gastroesophageal reflux disease) 04/23/2015 . HCAP (healthcare-associated pneumonia) 04/24/2017 . Heart murmur  . History of blood transfusion    "low HgB when I was going to dialysis center" . Hypertension  . Hypertension associated with end stage renal disease on dialysis 03/11/2014 . Malnutrition of moderate degree 09/02/2015 . Multiple myeloma ([place]) 03/11/2014 . Patient is Jehovah's Witness   "I'd rather never have any blood transfusions unless absolutely necessary; please check with me 1st". (04/24/2017) . Pneumonia ~ 2016 Past Surgical History: Past Surgical History: Procedure Laterality Date . AV FISTULA PLACEMENT Left  . I&D EXTREMITY Left 02/27/2016  Procedure: ARTHROSCOPIC IRRIGATION AND DEBRIDEMENT EXTREMITY;  Surgeon: Renette Butters, MD;  Location: [facility];  Service: Orthopedics;  Laterality: Left; . TEE WITHOUT CARDIOVERSION N/A 02/29/2016  Procedure: TRANSESOPHAGEAL ECHOCARDIOGRAM (TEE);  Surgeon: Thayer Headings, MD;  Location: [facility];  Service: Cardiovascular;  Laterality: N/A; HPI: Iyanla Eilers is a 77 y.o. female with PMH significant for ERSD on HD, HTN, pneumonia (2016), GERD, who presented to [name] (from SNF) on 08/12/18 with fever, AMS, cellulitis. Pt also had recent admission for a fall (07/26/18). Has been seen by ST July 2019 with MBS (recc D3/thin - possible UES dysfunction) and August 2019 (rec reg/thin). CXR showed minimal bibasilar atelectasis, cardiomegaly. Pt pocketing food with RN and expectorating food witnessed by MD.  Subjective: upset about having to leave room for test Assessment / Plan / Recommendation CHL IP CLINICAL IMPRESSIONS 08/17/2018 Clinical Impression Patient presents with a mild oropharyngeal dysphagia. Patient was upsest about having to leave room, but was compliant to complete evaluation. There was no penetration of aspiration with any consistency. Decreased  mastication due to only having upper dentures. Swallow triggered at the pyriform sinuses with large cup sip. Recommend Dys 3, thin liquid diet with no straws. Medication to be given in puree. Speech therapy to follow up for diet tolerance. aspiration risk  training/compensatory strategies training.  SLP Visit Diagnosis Dysphagia, oropharyngeal phase (R13.12) Attention and concentration deficit following -- Frontal lobe and executive function deficit following -- Impact on safety and function Mild aspiration risk   CHL IP TREATMENT RECOMMENDATION 08/17/2018 Treatment Recommendations Therapy as outlined in treatment plan below   Prognosis 08/17/2018 Prognosis for Safe Diet Advancement Good Barriers to [name] Goals Cognitive deficits Barriers/Prognosis Comment -- CHL IP DIET RECOMMENDATION 08/17/2018 SLP Diet Recommendations Dysphagia 3 (Mech soft) solids;Thin liquid Liquid Administration via Cup;No straw Medication Administration Whole meds with puree Compensations Minimize environmental distractions;Small sips/bites;Slow rate;Other (Comment) Postural Changes Remain semi-upright after after feeds/meals (Comment);Seated upright at 90 degrees   CHL IP OTHER RECOMMENDATIONS 08/17/2018 Recommended Consults -- Oral Care Recommendations Oral care BID Other Recommendations --   CHL IP FOLLOW UP RECOMMENDATIONS 08/17/2018 Follow up Recommendations Skilled Nursing facility   [facility] IP FREQUENCY AND DURATION 08/17/2018 Speech Therapy Frequency (ACUTE ONLY) min 1 x/week Treatment Duration 1 week      CHL IP ORAL PHASE 08/17/2018 Oral Phase Impaired Oral - Pudding Teaspoon -- Oral - Pudding Cup -- Oral - Honey Teaspoon -- Oral - Honey Cup -- Oral - Nectar Teaspoon -- Oral - Nectar Cup -- Oral - Nectar Straw -- Oral - Thin Teaspoon -- Oral - Thin Cup -- Oral - Thin Straw -- Oral - Puree -- Oral - Mech Soft -- Oral - Regular -- Oral - Multi-Consistency -- Oral - Pill -- Oral Phase - Comment --  CHL IP PHARYNGEAL PHASE 08/17/2018 Pharyngeal Phase Impaired Pharyngeal- Pudding Teaspoon -- Pharyngeal -- Pharyngeal- Pudding Cup -- Pharyngeal -- Pharyngeal- Honey Teaspoon -- Pharyngeal -- Pharyngeal- Honey Cup -- Pharyngeal -- Pharyngeal- Nectar Teaspoon -- Pharyngeal -- Pharyngeal- Nectar Cup  -- Pharyngeal -- Pharyngeal- Nectar Straw -- Pharyngeal -- Pharyngeal- Thin Teaspoon -- Pharyngeal -- Pharyngeal- Thin Cup Delayed swallow initiation-pyriform sinuses Pharyngeal -- Pharyngeal- Thin Straw -- Pharyngeal -- Pharyngeal- Puree -- Pharyngeal -- Pharyngeal- Mechanical Soft -- Pharyngeal -- Pharyngeal- Regular -- Pharyngeal -- Pharyngeal- Multi-consistency -- Pharyngeal -- Pharyngeal- Pill -- Pharyngeal -- Pharyngeal Comment --  CHL IP CERVICAL ESOPHAGEAL PHASE 08/17/2018 Cervical Esophageal Phase WFL Pudding Teaspoon -- Pudding Cup -- Honey Teaspoon -- Honey Cup -- Nectar Teaspoon -- Nectar Cup -- Nectar Straw -- Thin Teaspoon -- Thin Cup -- Thin Straw -- Puree -- Mechanical Soft -- Regular -- Multi-consistency -- Pill -- Cervical Esophageal Comment -- Charlynne Cousins Ward, MA, CCC-SLP 08/17/2018 12:41 PM              Ir Dialy Shunt Intro Needle/intracath Initial W/img Left  Result Date: 08/29/2018 INDICATION: End-stage renal disease, occluded left upper arm AV fistula EXAM: Left AV fistulogram (limited) MEDICATIONS: None. ANESTHESIA/SEDATION: Moderate Sedation Time:  None. The patient was continuously monitored during the procedure by the interventional radiology nurse under my direct supervision. FLUOROSCOPY TIME:  Fluoroscopy Time: 1 minutes 0 seconds (2 mGy). COMPLICATIONS: None immediate. PROCEDURE: Informed written consent was obtained from the patient after a thorough discussion of the procedural risks, benefits and alternatives. All questions were addressed. Maximal Sterile Barrier Technique was utilized including caps, mask, sterile gowns, sterile gloves, sterile drape, hand hygiene and skin antiseptic. A timeout was performed prior to the initiation of the procedure. Under sterile conditions,  Angiocath access performed of the left upper arm AV fistula. Limited contrast injection for fistulagram performed. Left upper arm fistulagram: This demonstrates near complete thrombotic occlusion of the left  upper arm brachiocephalic fistula. 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 By ultrasound, there is a large amount of thrombus burden throughout the entire left upper arm aneurysmal fistula extending to the axilla. Therefore, any aggressive intervention would be low yield for long term durability and also high risk for pulmonary embolism. 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 Large degree of thrombus burden throughout the fistula. See above comment. ACCESS: Because of the above findings, a tunneled right IJ dialysis catheter will be inserted. These results were called by telephone at the time of interpretation on 08/29/2018 at 9:51 am to Dr. Roney Jaffe , who verbally acknowledged these results. Electronically Signed   By: Jerilynn Mages.  Shick M.D.   On: 08/29/2018 09:51   Vas US Duplex Dialysis Access (avf, Avg)  Result Date: 08/14/2018 DIALYSIS ACCESS Reason for Exam: Swelling. Performing Technologist: Maudry Mayhew MHA, RDMS, RVT, RDCS  Examination Guidelines: A complete evaluation includes B-mode imaging, spectral Doppler, color Doppler, and power Doppler as needed of all accessible portions of each vessel. Unilateral testing is considered an integral part of a complete examination. Limited examinations for reoccurring indications may be performed as noted.  Findings: +--------------------+----------+-----------------+--------+ AVF                 PSV (cm/s)Flow Vol (mL/min)Comments +--------------------+----------+-----------------+--------+ Native artery inflow   707                              +--------------------+----------+-----------------+--------+ AVF Anastomosis        488                              +--------------------+----------+-----------------+--------+  +------------+----------+-------------+----------+--------+ OUTFLOW VEINPSV (cm/s)Diameter (cm)Depth (cm)Describe  +------------+----------+-------------+----------+--------+ Mid UA          40                                    +------------+----------+-------------+----------+--------+   Summary: Atypical flow is demonstrated in the anastomosis and in the outflow vein; not consistent with normal fistula flow. Anastomosis is stenotic.  *See table(s) above for measurements and observations.  Diagnosing physician: Monica Martinez MD Electronically signed by Monica Martinez MD on 08/14/2018 at 4:36:24 PM.   --------------------------------------------------------------------------------   Final    Vas [place] Upper Extremity Venous Duplex  Result Date: 08/14/2018 UPPER VENOUS STUDY  Indications: Swelling Limitations: Poor patient cooperation. Performing Technologist: Abram Sander  Examination Guidelines: A complete evaluation includes B-mode imaging, spectral Doppler, color Doppler, and power Doppler as needed of all accessible portions of each vessel. Bilateral testing is considered an integral part of a complete examination. Limited examinations for reoccurring indications may be performed as noted.  Right Findings: +----------+------------+----------+---------+-----------+-------------------+ RIGHT     CompressiblePropertiesPhasicitySpontaneous      Summary       +----------+------------+----------+---------+-----------+-------------------+ Subclavian                                          unable to visualize +----------+------------+----------+---------+-----------+-------------------+  Left Findings: +----------+------------+----------+---------+-----------+-------------------+ LEFT      CompressiblePropertiesPhasicitySpontaneous      Summary       +----------+------------+----------+---------+-----------+-------------------+ IJV           Full                 Yes       Yes                        +----------+------------+----------+---------+-----------+-------------------+ Subclavian     Full                 Yes       Yes                        +----------+------------+----------+---------+-----------+-------------------+ Axillary      Full                 Yes       Yes                        +----------+------------+----------+---------+-----------+-------------------+ Brachial      Full                                                      +----------+------------+----------+---------+-----------+-------------------+ Radial        Full                                                      +----------+------------+----------+---------+-----------+-------------------+ Ulnar                                               unable to visualize +----------+------------+----------+---------+-----------+-------------------+ Cephalic      Full                                                      +----------+------------+----------+---------+-----------+-------------------+ Basilic                                             unable to visualize +----------+------------+----------+---------+-----------+-------------------+  Summary:  Left: No evidence of deep vein thrombosis in the upper extremity. No evidence of superficial vein thrombosis in the upper extremity.  *See table(s) above for measurements and observations.  Diagnosing physician: Monica Martinez MD Electronically signed by Monica Martinez MD on 08/14/2018 at 4:35:58 PM.    Final     Microbiology: Recent Results (from the past 240 hour(s))  Culture, blood (routine x 2)     Status: Abnormal   Collection Time: 08/25/18  4:04 PM  Result Value Ref Range Status   Specimen Description BLOOD RIGHT FOREARM  Final   Special Requests   Final    BOTTLES DRAWN AEROBIC ONLY Blood Culture adequate volume   Culture  Setup  Time   Final    AEROBIC BOTTLE ONLY GRAM NEGATIVE RODS CRITICAL VALUE NOTED.  VALUE IS CONSISTENT WITH PREVIOUSLY REPORTED AND CALLED VALUE.    Culture (A)  Final     ACHROMOBACTER SPECIES SUSCEPTIBILITIES PERFORMED ON PREVIOUS CULTURE WITHIN THE LAST 5 DAYS. Performed at [name], [place] [address], [place], [place] [address]    Report Status 08/29/2018 FINAL  Final  Culture, blood (routine x 2)     Status: Abnormal   Collection Time: 08/25/18  4:09 PM  Result Value Ref Range Status   Specimen Description BLOOD RIGHT ANTECUBITAL  Final   Special Requests   Final    BOTTLES DRAWN AEROBIC AND ANAEROBIC Blood Culture adequate volume   Culture  Setup Time   Final    AEROBIC BOTTLE ONLY GRAM NEGATIVE RODS CRITICAL RESULT CALLED TO, READ BACK BY AND VERIFIED WITH: Diona Browner PHARMD 08/26/18 1825 JDW Performed at [name], [place] [address], [place], [place] [address]    Culture ACHROMOBACTER SPECIES (A)  Final   Report Status 08/28/2018 FINAL  Final   Organism ID, Bacteria ACHROMOBACTER SPECIES  Final      Susceptibility   Achromobacter species - MIC*    CEFEPIME 16 INTERMEDIATE Intermediate     CEFAZOLIN >=64 RESISTANT Resistant     GENTAMICIN >=16 RESISTANT Resistant     CIPROFLOXACIN >=4 RESISTANT Resistant     IMIPENEM 8 INTERMEDIATE Intermediate     TRIMETH/SULFA 160 RESISTANT Resistant     * ACHROMOBACTER SPECIES  Culture, blood (routine x 2)     Status: None   Collection Time: 08/28/18  8:42 AM  Result Value Ref Range Status   Specimen Description BLOOD RIGHT ANTECUBITAL  Final   Special Requests   Final    BOTTLES DRAWN AEROBIC AND ANAEROBIC Blood Culture adequate volume   Culture   Final    NO GROWTH 5 DAYS Performed at [name], [place] [address] [place], [place] [address]    Report Status 09/02/2018 FINAL  Final  Culture, blood (routine x 2)     Status: Abnormal   Collection Time: 08/28/18  8:42 AM  Result Value Ref Range Status   Specimen Description BLOOD BLOOD RIGHT FOREARM  Final   Special Requests   Final    BOTTLES DRAWN AEROBIC AND ANAEROBIC Blood Culture adequate volume   Culture  Setup Time   Final     GRAM NEGATIVE RODS AEROBIC BOTTLE ONLY CRITICAL VALUE NOTED.  VALUE IS CONSISTENT WITH PREVIOUSLY REPORTED AND CALLED VALUE.    Culture (A)  Final    ACHROMOBACTER XYLOSOXIDANS SUSCEPTIBILITIES PERFORMED ON PREVIOUS CULTURE WITHIN THE LAST 5 DAYS. Performed at [name], [place] [address] [place],  [address]    Report Status 09/02/2018 FINAL  Final     Labs: Basic Metabolic Panel: Recent Labs  Lab 08/28/18 0850 08/29/18 1106 08/30/18 0806 09/01/18 0352 09/02/18 0418  NA 118* 127* 125* 125* 125*  K 5.2* 4.2 4.4 4.4 4.6  CL 85* 95* 89* 89* 89*  CO2 20* '24 25 28 26  ' GLUCOSE 82 69* 115* 77 77  BUN 56* 24* 31* 22 27*  CREATININE 11.47* 7.50* 8.42* 6.36* 7.21*  CALCIUM 8.1* 8.3* 8.4* 8.7* 8.4*  PHOS  --   --  5.4*  --   --    Liver Function Tests: Recent Labs  Lab 08/28/18 0409 08/30/18 0806  AST 15  --   ALT 7  --  ALKPHOS 42  --   BILITOT 0.7  --   PROT 7.4  --   ALBUMIN 2.3* 2.1*   No results for input(s): LIPASE, AMYLASE in the last 168 hours. No results for input(s): AMMONIA in the last 168 hours. CBC: Recent Labs  Lab 08/28/18 0409 08/29/18 0420 08/30/18 0806 09/01/18 0352 09/02/18 0418  WBC 4.0 2.7* 3.3* 2.9* 3.1*  HGB 8.9* 8.7* 8.4* 7.8* 8.4*  HCT 27.5* 28.6* 27.0* 26.3* 27.9*  MCV 92.0 94.4 98.2 97.4 96.2  PLT 228 199 169 147* 158   Cardiac Enzymes: No results for input(s): CKTOTAL, CKMB, CKMBINDEX, TROPONINI in the last 168 hours. BNP: BNP (last 3 results) No results for input(s): BNP in the last 8760 hours.  ProBNP (last 3 results) No results for input(s): PROBNP in the last 8760 hours.  CBG: No results for input(s): GLUCAP in the last 168 hours.     Signed:  Domenic Polite MD.  [facility] 09/02/2018, 2:20 PM

## 2018-09-02 NOTE — Progress Notes (Signed)
Report called and given to [facility]. Called pt's daughter and made her aware that pt has gotten back from dialysis and has not left yet but is still planning to leave tonight.

## 2018-09-02 NOTE — Progress Notes (Signed)
[facility] Progress Note   Subjective:  Seen in room. Denies CP/dyspnea. S/p barium enema procedure yesterday without identified mass or obstruction.  Objective Vitals:   09/01/18 2026 09/02/18 0020 09/02/18 0400 09/02/18 0824  BP: (!) 152/87 130/84 (!) 142/85 134/79  Pulse: (!) 43 (!) 44 (!) 42 (!) 45  Resp: 12 11 16 15   Temp: 98.3 F (36.8 C) 98 F (36.7 C) 97.9 F (36.6 C) 98.4 F (36.9 C)  TempSrc: Oral Oral Oral Oral  SpO2: 98% 100% 99% 100%  Weight:   61.6 kg   Height:       Physical Exam General: Well appearing elderly woman, NAD Heart: RRR; 2/6 systolic murmur Lungs: CTAB Abdomen: soft, non-tender Extremities: No LE edema Dialysis Access: TDC in R chest  Additional Objective Labs: Basic Metabolic Panel: Recent Labs  Lab 08/30/18 0806 09/01/18 0352 09/02/18 0418  NA 125* 125* 125*  K 4.4 4.4 4.6  CL 89* 89* 89*  CO2 25 28 26   GLUCOSE 115* 77 77  BUN 31* 22 27*  CREATININE 8.42* 6.36* 7.21*  CALCIUM 8.4* 8.7* 8.4*  PHOS 5.4*  --   --    Liver Function Tests: Recent Labs  Lab 08/28/18 0409 08/30/18 0806  AST 15  --   ALT 7  --   ALKPHOS 42  --   BILITOT 0.7  --   PROT 7.4  --   ALBUMIN 2.3* 2.1*   CBC: Recent Labs  Lab 08/28/18 0409 08/29/18 0420 08/30/18 0806 09/01/18 0352 09/02/18 0418  WBC 4.0 2.7* 3.3* 2.9* 3.1*  HGB 8.9* 8.7* 8.4* 7.8* 8.4*  HCT 27.5* 28.6* 27.0* 26.3* 27.9*  MCV 92.0 94.4 98.2 97.4 96.2  PLT 228 199 169 147* 158   Studies/Results: Dg Colon W/cm - Wo/w Kub  Result Date: 09/01/2018 CLINICAL DATA:  GI bleed, anemia EXAM: BE WITH CONTRAST - WITHOUT AND WITH KUB CONTRAST:  Barium FLUOROSCOPY TIME:  Fluoroscopy Time:  2 minutes, 18 seconds Radiation Exposure Index (if provided by the fluoroscopic device): 32.9 mGy Number of Acquired Spot Images: 3 COMPARISON:  None. FINDINGS: The initial KUB demonstrates adequate prep, without a significant amount of stool in the colon. Vascular calcifications noted.  Probable scarring the lung bases. This patient was confused and has limited mobility. We were able to turn her prone and in the left lateral decubitus and right lateral decubitus positions. Turning her supine was not realistic. Some of our image sequences have the patient's arm across her belly, be reminded the patient multiple times to please move her arm by her sides. We were not able to turn up the patient into a full lateral projection. I used balloon compression in order to flatten out portions of the colon for better sensitivity. However, given the patient's difficulty in cooperating or turning, today's exam must be understood to have a reduced diagnostic sensitivity and specificity compared to a normal exam in a patient able to cooperate The barium column was advanced to tell barium into the terminal ileum. Upon compression fluoroscopy, scattered diverticula of the colon were observed without a concentrated appearance of diverticulosis. Patient was turned into decubitus positions in order to better characterize the bowel. There is some redundancy of the colon in the vicinity of the splenic flexure making it difficult to separate the loops of bowel. We were able to reflux the terminal ileum, which appears unremarkable. No appreciable colon mass is identified. No visualized filling defect in the barium column of the colon to favor  a significant polyp. A post evacuation image of the abdomen demonstrates no appreciable filling defect, and shows filling of the colon. IMPRESSION: 1. No mass or significant polyp is identified. Please note that the patient had some confusion and limited mobility which does mildly reduced diagnostic sensitivity and specificity. 2. Scattered colonic diverticula. Electronically Signed   By: Van Clines M.D.   On: 09/01/2018 16:29   Medications: . piperacillin-tazobactam (ZOSYN)  IV 3.375 g (09/02/18 0231)   . amLODipine  10 mg Oral QPM  . calcium acetate  1,334 mg Oral TID  WC  . Chlorhexidine Gluconate Cloth  6 each Topical Q0600  . famotidine  20 mg Oral Daily  . isosorbide mononitrate  60 mg Oral Daily  . lamoTRIgine  50 mg Oral Daily  . latanoprost  1 drop Both Eyes QHS  . LORazepam  0.5 mg Oral QHS  . losartan  50 mg Oral QPM  . multivitamin  1 tablet Oral QHS  . sevelamer carbonate  1,600 mg Oral TID WC  . simethicone  120 mg Oral TID    Dialysis Orders: TTS at AF 3.5hr, BFR 425, DFRA1.5, EDW 58kg, 2K/2.25Ca, AVF, no heparin - Hectoral 74mcg IV q HD - Epo 18,000 units IV q HD - Attends all HD sessions, but signs off early frequently  Assessment/Plan: 1. Sepsis/Achromobacter bacteremia: Recurrent, same issue last mo. ID involved, on IV Zosyn then changed to Ceftazidime on d/c with HD (end date 12/11). 2. ESRD: Continue HD per TTS schedule, for HD later today. 3. HTN/volume: Volume up on admit, improved now (although remains hyponatremia, so can prob push further). 4. Anemia: Hgb 7.1 on admit with + FOBT. S/p 2U PRBCs 11/27. GI consulted, barium enema without obstruction/diverticula noted. Up to 8.4 today. 5. Secondary hyperparathyroidism: Labs ok. Continue Renvela/Ca Acetate. 6. HD access: L AVF clotted, unable to be salvaged. TDC placed. For new access once bacteremia fully treated. 7. AMS: On admit, improved now. Chronic underlying dementia. 8. Nutrition: Alb low, adding supplements.   Veneta Penton, PA-C 09/02/2018, 9:26 AM  [facility] Pager: [phone]

## 2018-09-02 NOTE — Progress Notes (Signed)
Patient ID: [name], female   DOB: 1940-12-13, 77 y.o.   MRN: [id number]  This NP visited patient at the bedside as a follow up for scheduled meeting with family to discuss diagnosis, prognosis, goals of care, treatment options and anticipatory care needs.  Patient is oriented to person however she has little insight into her current situation and care needs.  Patient's daughter/[name], her son and the patient's brother/[name] were present  Detailed discussion was had regarding the current complex medical situation and various possible outcomes., considering her multiple comorbidities.  We discussed limitations of medical interventions to prolong quality of life when the body begins to fail to thrive and human mortality itself.  Created space and opportunity for family to explore feelings and thoughts regarding the current difficult situation.   At this time family is open to all offered and available medical interventions to prolong life. MOST form introduced.  Family encouraged to consider DNR/DNI status knowing poor outcomes in similar patients.  Recommend palliative care services to follow at facility.  Discussed with family the importance of continued conversation with family and the medical providers regarding overall plan of care and treatment options,  ensuring decisions are within the context of the patients values and GOCs.  Questions and concerns addressed   Discussed with Dr Broadus John   Total time spent on the unit was 45 minutes  Greater than 50% of the time was spent in counseling and coordination of care  Wadie Lessen NP  Palliative Medicine Team Team Phone # [phone] Pager [phone]

## 2018-09-02 NOTE — Procedures (Signed)
Patient seen on Hemodialysis. QB 300, UF goal 3L Treatment adjusted as needed.  Elmarie Shiley MD [facility]. Office # [phone] Pager # [phone] 2:28 PM

## 2018-09-03 ENCOUNTER — Telehealth: Payer: Self-pay

## 2018-09-03 ENCOUNTER — Non-Acute Institutional Stay (SKILLED_NURSING_FACILITY): Payer: Medicare Other | Admitting: Adult Health

## 2018-09-03 ENCOUNTER — Encounter: Payer: Self-pay | Admitting: Adult Health

## 2018-09-03 DIAGNOSIS — F419 Anxiety disorder, unspecified: Secondary | ICD-10-CM

## 2018-09-03 DIAGNOSIS — Z992 Dependence on renal dialysis: Secondary | ICD-10-CM

## 2018-09-03 DIAGNOSIS — I5189 Other ill-defined heart diseases: Secondary | ICD-10-CM

## 2018-09-03 DIAGNOSIS — K922 Gastrointestinal hemorrhage, unspecified: Secondary | ICD-10-CM

## 2018-09-03 DIAGNOSIS — F39 Unspecified mood [affective] disorder: Secondary | ICD-10-CM

## 2018-09-03 DIAGNOSIS — N186 End stage renal disease: Secondary | ICD-10-CM

## 2018-09-03 DIAGNOSIS — F329 Major depressive disorder, single episode, unspecified: Secondary | ICD-10-CM

## 2018-09-03 DIAGNOSIS — D638 Anemia in other chronic diseases classified elsewhere: Secondary | ICD-10-CM | POA: Diagnosis not present

## 2018-09-03 DIAGNOSIS — C9000 Multiple myeloma not having achieved remission: Secondary | ICD-10-CM

## 2018-09-03 DIAGNOSIS — R7881 Bacteremia: Secondary | ICD-10-CM

## 2018-09-03 DIAGNOSIS — F0391 Unspecified dementia with behavioral disturbance: Secondary | ICD-10-CM

## 2018-09-03 DIAGNOSIS — I150 Renovascular hypertension: Secondary | ICD-10-CM

## 2018-09-03 NOTE — Progress Notes (Signed)
Location:  [facility] Room Number: 109-A Place of Service:  SNF (31)  Provider:  Monina C. Medina-Vargas - NP IRJ:JOACZY, Darrick Penna, MD  Patient Care Team: Hendricks Limes, MD as PCP - General (Internal Medicine) Medina-Vargas, Senaida Lange, NP as Nurse Practitioner (Internal Medicine)  Extended Emergency Contact Information Primary Emergency Contact: Wilburn Mylar [place] of [place] Phone: [phone] Mobile Phone: [phone] Relation: Daughter Secondary Emergency Contact: Odie Sera [place] of [place] Mobile Phone: [phone] Relation: Brother  Code Status:   Goals of care: Advanced Directive information Advanced Directives 08/25/2018  Does Patient Have a Medical Advance Directive? No  Type of Advance Directive -  Does patient want to make changes to medical advance directive? -  Copy of [place] in Chart? -  Would patient like information on creating a medical advance directive? No - Patient declined     Chief Complaint  Patient presents with  . Acute Visit    Hospital followup, status post admission at [facility] 1125/19-09/02/18 for recurrent bacteremia.    HPI:  Pt is a 77 y.o. female , who is a long-term care resident of [facility], seen today for an acute visit. She was admitted to [place] on 09/02/18 S/P hospitalization due to recurrent Achromobacter bacteremia.  No clear source. She is immunocompromised secondary to multiple myeloma, chemotherapy and hypogammaglobulinemia. Of note, she was recently treated for same bacteremia with IV Meropenem X 10 days. Acromobacter from 11/25 is multidrug-resistant so IV meropenem was switched to Zosyn on 11/28. 2D echocardiogram did not show any evidence of vegetations. Infectious disease was consulted and recommended 2-week course of IV antibiotics. On discharge, IV Zosyn was transitioned to IV  ceftazadime with dialysis to complete antibiotic course on 1211.  She had hemoglobin of 7.1 which is thought to be multifactorial secondary to chronic kidney disease, multiple myeloma, chemotherapy and some low-grade GI bleed.  Hemoccult was positive.  She was transfused 2 units of packed RBC on 11/27.  She had declined colonoscopy.  She has completed barium enema which did not show any gross lesions in her colon.  Nephrology had difficulty cannulating left fistula.  She has temporary right IJ dialysis catheter.     Past Medical History:  Diagnosis Date  . Anxiety   . Closed fracture of right distal femur ([place]) 09/01/2015  . Depression   . ESRD (end stage renal disease) on dialysis [facility])    "TTS; [place]" (04/24/2017)  . GERD (gastroesophageal reflux disease) 04/23/2015  . HCAP (healthcare-associated pneumonia) 04/24/2017  . Heart murmur   . History of blood transfusion    "low HgB when I was going to dialysis center"  . Hypertension   . Hypertension associated with end stage renal disease on dialysis 03/11/2014  . Malnutrition of moderate degree 09/02/2015  . Multiple myeloma ([place]) 03/11/2014  . Patient is Jehovah's Witness    "I'd rather never have any blood transfusions unless absolutely necessary; please check with me 1st". (04/24/2017)  . Pneumonia ~ 2016   Past Surgical History:  Procedure Laterality Date  . AV FISTULA PLACEMENT Left   . I&D EXTREMITY Left 02/27/2016   Procedure: ARTHROSCOPIC IRRIGATION AND DEBRIDEMENT EXTREMITY;  Surgeon: Renette Butters, MD;  Location: [place];  Service: Orthopedics;  Laterality: Left;  . IR DIALY SHUNT INTRO NEEDLE/INTRACATH INITIAL W/IMG LEFT Left 08/29/2018  . IR FLUORO GUIDE CV LINE RIGHT  08/29/2018  . IR US GUIDE VASC  ACCESS RIGHT  08/29/2018  . TEE WITHOUT CARDIOVERSION N/A 02/29/2016   Procedure: TRANSESOPHAGEAL ECHOCARDIOGRAM (TEE);  Surgeon: Thayer Headings, MD;  Location: [place];  Service: Cardiovascular;  Laterality: N/A;     Allergies  Allergen Reactions  . Phenergan [Promethazine Hcl] Other (See Comments)    Acute encephalopathy in the context of refusal to go to hemodialysis and administration of Phenergan for nausea and vomiting    Allergies as of 09/03/2018      Reactions   Phenergan [promethazine Hcl] Other (See Comments)   Acute encephalopathy in the context of refusal to go to hemodialysis and administration of Phenergan for nausea and vomiting      Medication List        Accurate as of 09/03/18  8:03 PM. Always use your most recent med list.          acetaminophen 325 MG tablet Commonly known as:  TYLENOL Take 650 mg by mouth every 6 (six) hours as needed for fever or headache (pain). Do not exceed 3000 mg in 24 hours   amLODipine 10 MG tablet Commonly known as:  NORVASC Take 10 mg by mouth every evening. Hold for SBP <100MM/HG   bisacodyl 10 MG suppository Commonly known as:  DULCOLAX Place 10 mg rectally daily as needed for moderate constipation (constipation not relieved by MOM).   calcium acetate 667 MG capsule Commonly known as:  PHOSLO Take 1,334 mg by mouth 3 (three) times daily with meals.   cefTAZidime 2 g injection Commonly known as:  FORTAZ Inject 2 g into the muscle every dialysis for up to 8 days.   cloNIDine 0.1 MG tablet Commonly known as:  CATAPRES Take 0.1 mg by mouth every 8 (eight) hours as needed (sbp >180).   FLEET ENEMA RE Place 1 enema rectally daily as needed (constipation not relieved by bisacodyl suppository).   hydrocortisone cream 1 % Apply 1 application topically daily as needed for itching.   isosorbide mononitrate 60 MG 24 hr tablet Commonly known as:  IMDUR Take 60 mg by mouth daily.   lamoTRIgine 25 MG tablet Commonly known as:  LAMICTAL Take 50 mg by mouth daily.   latanoprost 0.005 % ophthalmic solution Commonly known as:  XALATAN Place 1 drop into both eyes at bedtime.   loperamide 2 MG tablet Commonly known as:  IMODIUM  A-D Take 4 mg by mouth daily as needed (on Dialysis days).   LORazepam 0.5 MG tablet Commonly known as:  ATIVAN Take 0.5 mg by mouth at bedtime.   losartan 50 MG tablet Commonly known as:  COZAAR Take 50 mg by mouth every evening.   multivitamin Tabs tablet Take 1 tablet by mouth daily.   ranitidine 300 MG tablet Commonly known as:  ZANTAC Take 300 mg by mouth at bedtime. Reported on 03/05/2016   sertraline 50 MG tablet Commonly known as:  ZOLOFT Take 50 mg by mouth daily.   sevelamer carbonate 800 MG tablet Commonly known as:  RENVELA Take 2 tablets (1,600 mg total) by mouth 3 (three) times daily with meals.   simethicone 125 MG chewable tablet Commonly known as:  MYLICON Chew 259 mg by mouth daily. 6PM   valACYclovir 500 MG tablet Commonly known as:  VALTREX Take 1 tablet (500 mg total) by mouth every other day.       REVIEW OF SYSTEMS  GENERAL: no change in appetite, no fatigue, no weight changes, no fever, chills or weakness MOUTH and THROAT: Denies oral discomfort, gingival  pain or bleeding, pain from teeth or hoarseness   RESPIRATORY: no cough, SOB, DOE, wheezing, hemoptysis CARDIAC: no chest pain, edema or palpitations GI: no abdominal pain, diarrhea, constipation, heart burn, nausea or vomiting NEUROLOGICAL: Denies dizziness, syncope, numbness, or headache PSYCHIATRIC: Denies feeling of depression or anxiety. No report of hallucinations, insomnia, paranoia, or agitation    Immunization History  Administered Date(s) Administered  . Influenza,inj,Quad PF,6+ Mos 07/23/2014, 08/01/2016  . Influenza,inj,quad, With Preservative 08/07/2017  . PPD Test 04/13/2015  . Pneumococcal Polysaccharide-23 07/18/2017  . Tdap 07/01/2018   Pertinent  Health Maintenance Due  Topic Date Due  . DEXA SCAN  11/28/2018 (Originally 03/20/2004)  . INFLUENZA VACCINE  08/02/2019 (Originally 05/01/2018)  . PNA vac Low Risk Adult (2 of 2 - PCV13) 09/04/2019 (Originally 07/18/2018)    Fall Risk  06/26/2018 06/06/2017 11/09/2016 06/15/2016 05/25/2016  Falls in the past year? Yes No No No No  Number falls in past yr: 2 or more - - - -  Injury with Fall? No - - - -  Risk Factor Category  - - - - -  Risk for fall due to : - - - - -  Risk for fall due to: Comment - - - - -  Follow up - - - - -   Functional Status Survey:    Vitals:   09/03/18 0851  BP: (!) 168/90  Pulse: 60  Resp: 20  Temp: 99.1 F (37.3 C)  TempSrc: Oral  SpO2: 97%  Weight: 126 lb 14.4 oz (57.6 kg)  Height: _0  (1.6 m)   Body mass index is 22.48 kg/m.  PHYSICAL EXAMINATION  GENERAL APPEARANCE:  In no acute distress.  SKIN:  Skin is warm and dry.  MOUTH and THROAT: Lips are without lesions. Oral mucosa is moist and without lesions. Tongue is normal in shape, size, and color and without lesions RESPIRATORY: breathing is even & unlabored, BS CTAB CARDIAC: RRR, no murmur,no extra heart sounds, no edema, right IJ cath on right chest, Left upper arm AV fistula EXTREMITIES:  Able to move X 4 extremities GI: abdomen soft, normal BS, no masses, no tenderness NEUROLOGICAL: There is no tremor. Speech is clear. Alert and oriented X 3. PSYCHIATRIC:  Affect and behavior are appropriate   Labs reviewed: Recent Labs    05/29/18 0526  08/16/18 1247 08/19/18 0724  08/30/18 0806 09/01/18 0352 09/02/18 0418  NA 136   < > 132* 128*   < > 125* 125* 125*  K 3.4*   < > 3.5 3.5   < > 4.4 4.4 4.6  CL 99   < > 98 95*   < > 89* 89* 89*  CO2 27   < > 27 23   < > _1 GLUCOSE 74   < > 83 82   < > 115* 77 77  BUN 19   < > 49* 52*   < > 31* 22 27*  CREATININE 5.95*   < > 7.61* 8.13*   < > 8.42* 6.36* 7.21*  CALCIUM 9.2   < > 8.8* 8.8*   < > 8.4* 8.7* 8.4*  MG 2.1  --   --   --   --   --   --   --   PHOS 2.9   < > 4.1 5.7*  --  5.4*  --   --    < > = values in this interval not displayed.   Recent Labs  08/12/18 2025  08/25/18 1607 08/28/18 0409 08/30/18 0806  AST 31  --  16 15  --   ALT 13   --  8 7  --   ALKPHOS 60  --  44 42  --   BILITOT 1.1  --  0.8 0.7  --   PROT 9.7*  --  8.2* 7.4  --   ALBUMIN 3.5   < > 2.7* 2.3* 2.1*   < > = values in this interval not displayed.   Recent Labs    08/12/18 2025  08/25/18 1607 08/25/18 2301  08/30/18 0806 09/01/18 0352 09/02/18 0418  WBC 9.3   < > 3.9* 3.7*   < > 3.3* 2.9* 3.1*  NEUTROABS 8.7*  --  2.9 2.6  --   --   --   --   HGB 11.3*   < > 7.1* 7.1*   < > 8.4* 7.8* 8.4*  HCT 37.7   < > 23.6* 23.3*   < > 27.0* 26.3* 27.9*  MCV 104.1*   < > 100.4* 99.6   < > 98.2 97.4 96.2  PLT 192   < > 234 228   < > 169 147* 158   < > = values in this interval not displayed.   Lab Results  Component Value Date   TSH 2.777 08/26/2018   Lab Results  Component Value Date   HGBA1C 4.9 03/13/2016   Lab Results  Component Value Date   CHOL 202 (A) 11/27/2017   HDL 77 (A) 11/27/2017   LDLCALC 112 11/27/2017   TRIG 65 11/27/2017    Significant Diagnostic Results in last 30 days:  Dg Chest 2 View  Result Date: 08/25/2018 CLINICAL DATA:  Altered mental status EXAM: CHEST - 2 VIEW COMPARISON:  Chest x-rays dated 08/12/2018 and 06/26/2017 FINDINGS: Stable cardiomegaly. Lungs are clear. No pleural effusion or pneumothorax seen. Osseous structures about the chest are unremarkable. IMPRESSION: 1. No active cardiopulmonary disease. No evidence of pneumonia or pulmonary edema. 2. Stable cardiomegaly. Electronically Signed   By: Franki Cabot M.D.   On: 08/25/2018 17:23   Ct Head Wo Contrast  Result Date: 08/25/2018 CLINICAL DATA:  New onset weakness after dialysis. Hypotension. Slurred speech. EXAM: CT HEAD WITHOUT CONTRAST TECHNIQUE: Contiguous axial images were obtained from the base of the skull through the vertex without intravenous contrast. COMPARISON:  CT head without contrast 07/26/2018 FINDINGS: Brain: Moderate atrophy and white matter is similar to the prior exam. No acute infarct, hemorrhage, or mass lesion is present. Diffuse  hypoattenuation is present throughout the basal ganglia. A remote lacunar infarct is again seen in the left cerebellum. Ventricles are proportionate to the degree of atrophy. No significant extra-axial fluid collection present. Vascular: Atherosclerotic calcifications are present within the cavernous internal carotid arteries bilaterally. There is no hyperdense vessel. Skull: Multiple lucent lesions are again noted. There is no significant interval change. Findings are consistent with known multiple myeloma. There is a focal lesion involving the left arch of C1. Sinuses/Orbits: The paranasal sinuses and mastoid air cells are clear. Globes and orbits are within normal limits. IMPRESSION: 1. Stable appearance of atrophy and white matter disease. 2. No acute intracranial abnormality. 3. Multiple lucent lesions in the calvarium and left-sided arch of C1 consistent with known multiple myeloma. There is no significant progression. Electronically Signed   By: San Morelle M.D.   On: 08/25/2018 17:22    Assessment/Plan  1. Gram-negative bacteremia - No clear source. She is immunocompromised secondary  to multiple myeloma, chemotherapy and hypogammaglobulinemia. Of note, she was recently treated for same bacteremia with IV Meropenem X 10 days. Acromobacter from 11/25 is multidrug-resistant so IV meropenem was switched to Zosyn on 11/28. 2D echocardiogram did not show any evidence of vegetations. Infectious disease was consulted and recommended 2-week course of IV antibiotics. On discharge, IV Zosyn was transitioned to IV ceftazadime with dialysis to complete antibiotic course on 1211.  2. End-stage renal disease on hemodialysis [facility]) - Nephrology had difficulty cannulating left fistula.  She has temporary right IJ dialysis catheter; on hemodialysis on Tuesdays, Thursdays, and Saturdays   3. Renovascular hypertension - continue Clonidine 0.1 mg 1 tab Q 8 hours PRN, amlodipine 10 mg 1 tab daily and losartan 50  mg 1 tab q. evening   4. Anemia of chronic disease  - had hemoglobin of 7.1 which is thought to be multifactorial secondary to chronic kidney disease, multiple myeloma, chemotherapy and some low-grade GI bleed.  Hemoccult was positive.  She was transfused 2 units of packed RBC on 11/27; will continue Iron and Aranesp with dialysis Lab Results  Component Value Date   HGB 8.4 (L) 09/02/2018    5. Multiple myeloma, remission status unspecified (HCC) - follows up with [facility] hospital, continue valacyclovir 500 mg 1 tab q. other days for prophylaxis   6. Diastolic dysfunction - stable, continue isosorbide MN ER 60 mg 1 tab daily   7. Mood disorder (HCC) - continue lorazepam 0.5 mg 1 tab nightly   8. Anxiety and depression -continue sertraline 50 mg 1 tab daily and lamotrigine 25 mg 2 tabs daily, followed up by team health psych NP   9. Gastrointestinal hemorrhage, unspecified gastrointestinal hemorrhage type - hemoccult was positive, she declined colonoscopy; completed barium enema which did not show any gross lesions in her colon; will continue ranitidine 300 mg 1 tab nightly   10.  Dementia with behavioral disturbance -continue supportive care, fall precautions     Family/ staff Communication: Discussed plan of care with resident.  Labs/tests ordered: None  Goal of care: Long-term care

## 2018-09-03 NOTE — Progress Notes (Deleted)
m °

## 2018-09-03 NOTE — Telephone Encounter (Signed)
Possible re-admission to facility. This is a patient you were seeing at [facility]. [facility] F/U is needed if patient was re-admitted to facility upon discharge. Hospital discharge from [facility] on 09/02/2018

## 2018-09-04 ENCOUNTER — Encounter: Payer: Self-pay | Admitting: Internal Medicine

## 2018-09-04 ENCOUNTER — Non-Acute Institutional Stay (SKILLED_NURSING_FACILITY): Payer: Medicare Other | Admitting: Internal Medicine

## 2018-09-04 DIAGNOSIS — I10 Essential (primary) hypertension: Secondary | ICD-10-CM

## 2018-09-04 DIAGNOSIS — D631 Anemia in chronic kidney disease: Secondary | ICD-10-CM

## 2018-09-04 DIAGNOSIS — M545 Low back pain, unspecified: Secondary | ICD-10-CM

## 2018-09-04 DIAGNOSIS — N189 Chronic kidney disease, unspecified: Secondary | ICD-10-CM | POA: Diagnosis not present

## 2018-09-04 DIAGNOSIS — R29818 Other symptoms and signs involving the nervous system: Secondary | ICD-10-CM

## 2018-09-04 DIAGNOSIS — A419 Sepsis, unspecified organism: Secondary | ICD-10-CM | POA: Diagnosis not present

## 2018-09-04 DIAGNOSIS — R4189 Other symptoms and signs involving cognitive functions and awareness: Secondary | ICD-10-CM

## 2018-09-04 NOTE — Patient Instructions (Signed)
See assessment and plan under each diagnosis in the problem list and acutely for this visit 

## 2018-09-04 NOTE — Progress Notes (Signed)
NURSING HOME LOCATION:  [facility] ROOM NUMBER:  109-A  CODE STATUS:  Full Code  PCP:  Hendricks Limes, MD  [place] [id number]  This is a [facility] readmission within 30 days.  Interim medical record and care since last [place] visit was updated with review of diagnostic studies and change in clinical status since last visit were documented.  HPI: The patient was rehospitalized 11/25-12/11/2017 with recurrent Achromobacter bacteremia.  She had been hospitalized recently with sepsis with gram-negative bacteremia with Achromobacter species.  No etiologic source was documented.  She received IV meropenem to complete a two-week course as of 11/29.  In the interim she presented to the ED late the evening 11/25 with weakness, hypotension, anemia, and sodium of 121.  Again there was no clear source for the bacteremia ; but she is immunosuppressed secondary to multiple myeloma for which she is receiving chemotherapy.  The Achromobacter isolated is multidrug-resistant.  ID was consulted and the patient was switched to [facility] 11/28.  Repeat blood cultures were negative.  Echocardiogram revealed no valve vegetations.  At discharge patient was transitioned to IV ceftazidime at dialysis every [place] & Sat with completion of antibiotics as of 12/11. Hemoglobin was 7.1 at admission; anemia was felt to be multifactorial in the context of chronic kidney disease, multiple myeloma on chemotherapy  and some low-grade GI bleed as well.  Hemoccult was positive in the ED.  She received 2 units of packed red cells on 11/27 with hemoglobin remaining in the range of 8-9.  GI was consulted but the patient declined a colonoscopy.  Barium enema did not show any gross lesions in the colon. Dialysis was to be continued Tuesday, Thursday, and Saturday. CT of the head was performed because of clinical dementia and cognitive dysfunction.  This was  unremarkable. Carfilzomib and dexamethasone were to be continued through the [facility] clinic in [facility]. Palliative Care was consulted; daughter is decision-maker and is adamant about a full code with full aggressive scope of treatment.  Review of systems: Based on [facility] mental status testing she has significant neurocognitive  defect; but today she is alert and oriented for the most part.  She did tell me she was 89 but was able to give me her correct date of birth of [birth year].  When I pointed this out to her she said that she "misspoke". Today she complains of severe, constant, sharp lumbosacral area pain across the low back bilaterally.  It does not radiate into the legs and is not associated with stool or urinary incontinence , radicular pain, numbness or tingling.  She states that the pain is worse when she reaches or flexes at the waist.  The pain has been going on 2 days and there was no definite etiologic factor.  She does mention that she fell October 26 while standing.  She states she lost her balance while standing between the wheelchair and bed and fell on her buttocks.  There was no cardiac or neurologic prodrome prior to the fall. [facility] mobile imaging reports were reviewed.  She actually fell 08/12/2018 striking her left shoulder.  Films were negative except for degenerative changes. Although she is on hemodialysis, she states that she soaks her pamper 3 times a day with urine; this is a recurrent problem for the last 3 weeks by her history.  Constitutional: No fever, significant weight change, fatigue  Eyes: No redness, discharge, pain, vision change ENT/mouth: No  nasal congestion,  purulent discharge, earache, change in hearing, sore throat  Cardiovascular: No chest pain, palpitations, paroxysmal nocturnal dyspnea, claudication, edema  Respiratory: No cough, sputum production, hemoptysis, DOE, significant snoring, apnea   Gastrointestinal: No heartburn, dysphagia, abdominal  pain, nausea /vomiting, rectal bleeding, melena, change in bowels Genitourinary: No dysuria, hematuria, pyuria, incontinence, nocturia Musculoskeletal: No joint stiffness, joint swelling Dermatologic: No rash, pruritus, change in appearance of skin Neurologic: No dizziness, headache, syncope, seizures, numbness, tingling Psychiatric: No significant anxiety, depression, insomnia, anorexia Endocrine: No change in hair/skin/nails, excessive thirst, excessive hunger, excessive urination  Hematologic/lymphatic: No significant bruising, lymphadenopathy, abnormal bleeding  Physical exam:  Pertinent or positive findings: She does appear younger than her actual age.  Ptosis is greater on the right than the left.  As she begins to answer she will open her eyes widly as if in a shock.  She is wearing only the upper plate.  She has a grade 1.7-0 systolic murmur at the base with slight left carotid radiation.  She has nonpitting edema at the ankles.  Pedal pulses are decreased.  Reflexes are 1/2+.  Negative straight leg raising.  Minimal clubbing of the nailbeds is present.  She has no significant arthritic changes in her hands.  General appearance: Adequately nourished; no acute distress, increased work of breathing is present.   Lymphatic: No lymphadenopathy about the head, neck, axilla. Eyes: No conjunctival inflammation or lid edema is present. There is no scleral icterus. Ears:  External ear exam shows no significant lesions or deformities.   Nose:  External nasal examination shows no deformity or inflammation. Nasal mucosa are pink and moist without lesions, exudates Oral exam:  Lips and gums are healthy appearing. There is no oropharyngeal erythema or exudate. Neck:  No thyromegaly, masses, tenderness noted.    Heart:  Normal rate and regular rhythm. S1 and S2 normal without gallop, click, rub .  Lungs: Chest clear to auscultation without wheezes, rhonchi, rales, rubs. Abdomen: Bowel sounds are  normal. Abdomen is soft and nontender with no organomegaly, hernias, masses. GU: Deferred  Extremities:  No cyanosis Neurologic exam : Balance, Rhomberg, finger to nose testing could not be completed due to clinical state Deep tendon reflexes are equal Skin: Warm & dry w/o tenting. No significant lesions or rash.  See summary under each active problem in the Problem List with associated updated therapeutic plan

## 2018-09-04 NOTE — Assessment & Plan Note (Addendum)
Risk of noncompliance with the antihypertensive medications discussed Blood pressure will be rechecked at hemodialysis today Hypotension has been issue with HD intermittently

## 2018-09-04 NOTE — Assessment & Plan Note (Signed)
Complete current course of antibiotics 09/10/2018; antibiotics given  3 times a week at hemodialysis

## 2018-09-04 NOTE — Assessment & Plan Note (Signed)
09/04/2018 she was able to give the correct date but stated that she was 77 years old, not 33.  She also gave the wrong date concerning a fall which occurred 11/12.  She is much clearer mentally than usually; her intermittent encephalopathy appears to correlate with noncompliance with dialysis and/or antihypertensives medications with dramatic elevation in her blood pressure

## 2018-09-05 NOTE — Assessment & Plan Note (Signed)
CBC monitored T,Th & Sat @ HD & @ Oncology visits

## 2018-09-12 ENCOUNTER — Other Ambulatory Visit: Payer: Self-pay

## 2018-09-12 MED ORDER — LORAZEPAM 0.5 MG PO TABS: 0.5000 mg | ORAL_TABLET | Freq: Every day | ORAL | 0 refills | Status: DC

## 2018-09-12 NOTE — Telephone Encounter (Signed)
Hard script written by Monina Medina-Vargas, NP and given to nurse. 

## 2018-09-15 DIAGNOSIS — D631 Anemia in chronic kidney disease: Secondary | ICD-10-CM | POA: Diagnosis present

## 2018-09-15 DIAGNOSIS — Z9115 Patient's noncompliance with renal dialysis: Secondary | ICD-10-CM

## 2018-09-15 DIAGNOSIS — I674 Hypertensive encephalopathy: Secondary | ICD-10-CM | POA: Diagnosis not present

## 2018-09-15 DIAGNOSIS — I4581 Long QT syndrome: Secondary | ICD-10-CM | POA: Diagnosis present

## 2018-09-15 DIAGNOSIS — E8889 Other specified metabolic disorders: Secondary | ICD-10-CM | POA: Diagnosis present

## 2018-09-15 DIAGNOSIS — I161 Hypertensive emergency: Secondary | ICD-10-CM | POA: Diagnosis present

## 2018-09-15 DIAGNOSIS — F419 Anxiety disorder, unspecified: Secondary | ICD-10-CM | POA: Diagnosis present

## 2018-09-15 DIAGNOSIS — I517 Cardiomegaly: Secondary | ICD-10-CM | POA: Diagnosis present

## 2018-09-15 DIAGNOSIS — N186 End stage renal disease: Secondary | ICD-10-CM | POA: Diagnosis present

## 2018-09-15 DIAGNOSIS — C9000 Multiple myeloma not having achieved remission: Secondary | ICD-10-CM | POA: Diagnosis present

## 2018-09-15 DIAGNOSIS — I491 Atrial premature depolarization: Secondary | ICD-10-CM | POA: Diagnosis present

## 2018-09-15 DIAGNOSIS — R4701 Aphasia: Secondary | ICD-10-CM | POA: Diagnosis present

## 2018-09-15 DIAGNOSIS — R29810 Facial weakness: Secondary | ICD-10-CM | POA: Diagnosis present

## 2018-09-15 DIAGNOSIS — R29716 NIHSS score 16: Secondary | ICD-10-CM | POA: Diagnosis present

## 2018-09-15 DIAGNOSIS — Z992 Dependence on renal dialysis: Secondary | ICD-10-CM

## 2018-09-15 DIAGNOSIS — F329 Major depressive disorder, single episode, unspecified: Secondary | ICD-10-CM | POA: Diagnosis present

## 2018-09-15 DIAGNOSIS — Z9221 Personal history of antineoplastic chemotherapy: Secondary | ICD-10-CM

## 2018-09-15 DIAGNOSIS — D696 Thrombocytopenia, unspecified: Secondary | ICD-10-CM | POA: Diagnosis present

## 2018-09-15 DIAGNOSIS — Z9119 Patient's noncompliance with other medical treatment and regimen: Secondary | ICD-10-CM

## 2018-09-15 DIAGNOSIS — R008 Other abnormalities of heart beat: Secondary | ICD-10-CM | POA: Diagnosis present

## 2018-09-15 DIAGNOSIS — R471 Dysarthria and anarthria: Secondary | ICD-10-CM | POA: Diagnosis present

## 2018-09-15 DIAGNOSIS — K219 Gastro-esophageal reflux disease without esophagitis: Secondary | ICD-10-CM | POA: Diagnosis present

## 2018-09-15 DIAGNOSIS — R402140 Coma scale, eyes open, spontaneous, unspecified time: Secondary | ICD-10-CM | POA: Diagnosis present

## 2018-09-15 DIAGNOSIS — I248 Other forms of acute ischemic heart disease: Secondary | ICD-10-CM | POA: Diagnosis present

## 2018-09-15 DIAGNOSIS — I169 Hypertensive crisis, unspecified: Secondary | ICD-10-CM | POA: Diagnosis not present

## 2018-09-15 DIAGNOSIS — I160 Hypertensive urgency: Secondary | ICD-10-CM | POA: Diagnosis present

## 2018-09-15 DIAGNOSIS — R402220 Coma scale, best verbal response, incomprehensible words, unspecified time: Secondary | ICD-10-CM | POA: Diagnosis present

## 2018-09-15 DIAGNOSIS — R531 Weakness: Secondary | ICD-10-CM | POA: Diagnosis present

## 2018-09-15 DIAGNOSIS — N2581 Secondary hyperparathyroidism of renal origin: Secondary | ICD-10-CM | POA: Diagnosis present

## 2018-09-15 DIAGNOSIS — Z888 Allergy status to other drugs, medicaments and biological substances status: Secondary | ICD-10-CM

## 2018-09-15 DIAGNOSIS — I120 Hypertensive chronic kidney disease with stage 5 chronic kidney disease or end stage renal disease: Secondary | ICD-10-CM | POA: Diagnosis present

## 2018-09-15 DIAGNOSIS — Z9114 Patient's other noncompliance with medication regimen: Secondary | ICD-10-CM

## 2018-09-15 DIAGNOSIS — R402340 Coma scale, best motor response, flexion withdrawal, unspecified time: Secondary | ICD-10-CM | POA: Diagnosis present

## 2018-09-15 DIAGNOSIS — Z79899 Other long term (current) drug therapy: Secondary | ICD-10-CM

## 2018-09-15 DIAGNOSIS — F0390 Unspecified dementia without behavioral disturbance: Secondary | ICD-10-CM | POA: Diagnosis present

## 2018-09-16 ENCOUNTER — Emergency Department (HOSPITAL_COMMUNITY): Payer: Medicare Other

## 2018-09-16 ENCOUNTER — Observation Stay (HOSPITAL_COMMUNITY): Payer: Medicare Other

## 2018-09-16 ENCOUNTER — Encounter (HOSPITAL_COMMUNITY): Payer: Self-pay | Admitting: Emergency Medicine

## 2018-09-16 ENCOUNTER — Inpatient Hospital Stay (HOSPITAL_COMMUNITY)
Admission: EM | Admit: 2018-09-16 | Discharge: 2018-09-19 | DRG: 077 | Disposition: A | Discharge status: Skilled Nursing Facility | Payer: Medicare Other | Attending: Internal Medicine | Admitting: Internal Medicine

## 2018-09-16 DIAGNOSIS — I169 Hypertensive crisis, unspecified: Secondary | ICD-10-CM

## 2018-09-16 DIAGNOSIS — Z992 Dependence on renal dialysis: Secondary | ICD-10-CM

## 2018-09-16 DIAGNOSIS — I10 Essential (primary) hypertension: Secondary | ICD-10-CM

## 2018-09-16 DIAGNOSIS — F419 Anxiety disorder, unspecified: Secondary | ICD-10-CM | POA: Diagnosis not present

## 2018-09-16 DIAGNOSIS — G9340 Encephalopathy, unspecified: Secondary | ICD-10-CM | POA: Diagnosis not present

## 2018-09-16 DIAGNOSIS — F329 Major depressive disorder, single episode, unspecified: Secondary | ICD-10-CM

## 2018-09-16 DIAGNOSIS — R9431 Abnormal electrocardiogram [ECG] [EKG]: Secondary | ICD-10-CM

## 2018-09-16 DIAGNOSIS — I160 Hypertensive urgency: Secondary | ICD-10-CM

## 2018-09-16 DIAGNOSIS — D631 Anemia in chronic kidney disease: Secondary | ICD-10-CM

## 2018-09-16 DIAGNOSIS — C9000 Multiple myeloma not having achieved remission: Secondary | ICD-10-CM

## 2018-09-16 DIAGNOSIS — G92 Toxic encephalopathy: Secondary | ICD-10-CM | POA: Diagnosis not present

## 2018-09-16 DIAGNOSIS — N189 Chronic kidney disease, unspecified: Secondary | ICD-10-CM

## 2018-09-16 DIAGNOSIS — N186 End stage renal disease: Secondary | ICD-10-CM | POA: Diagnosis not present

## 2018-09-16 DIAGNOSIS — Z66 Do not resuscitate: Secondary | ICD-10-CM

## 2018-09-16 DIAGNOSIS — I214 Non-ST elevation (NSTEMI) myocardial infarction: Secondary | ICD-10-CM

## 2018-09-16 LAB — URINALYSIS, ROUTINE W REFLEX MICROSCOPIC
Bacteria, UA: NONE SEEN
Bilirubin Urine: NEGATIVE
Glucose, UA: 50 mg/dL — AB
Hgb urine dipstick: NEGATIVE
Ketones, ur: 5 mg/dL — AB
Leukocytes, UA: NEGATIVE
Nitrite: NEGATIVE
Protein, ur: 100 mg/dL — AB
Specific Gravity, Urine: 1.01 (ref 1.005–1.030)
pH: 9 — ABNORMAL HIGH (ref 5.0–8.0)

## 2018-09-16 LAB — RAPID URINE DRUG SCREEN, HOSP PERFORMED
Amphetamines: NOT DETECTED
Barbiturates: NOT DETECTED
Benzodiazepines: NOT DETECTED
Cocaine: NOT DETECTED
Opiates: NOT DETECTED
TETRAHYDROCANNABINOL: NOT DETECTED

## 2018-09-16 LAB — HEMOGLOBIN A1C
HEMOGLOBIN A1C: 4.6 % — AB (ref 4.8–5.6)
Mean Plasma Glucose: 85.32 mg/dL

## 2018-09-16 LAB — BASIC METABOLIC PANEL
Anion gap: 12 (ref 5–15)
BUN: 28 mg/dL — ABNORMAL HIGH (ref 8–23)
CO2: 28 mmol/L (ref 22–32)
Calcium: 9.5 mg/dL (ref 8.9–10.3)
Chloride: 93 mmol/L — ABNORMAL LOW (ref 98–111)
Creatinine, Ser: 6.72 mg/dL — ABNORMAL HIGH (ref 0.44–1.00)
GFR calc Af Amer: 6 mL/min — ABNORMAL LOW (ref >60)
GFR calc non Af Amer: 5 mL/min — ABNORMAL LOW (ref >60)
Glucose, Bld: 81 mg/dL (ref 70–99)
Potassium: 5 mmol/L (ref 3.5–5.1)
Sodium: 133 mmol/L — ABNORMAL LOW (ref 135–145)

## 2018-09-16 LAB — COMPREHENSIVE METABOLIC PANEL
ALBUMIN: 2.5 g/dL — AB (ref 3.5–5.0)
ALK PHOS: 51 U/L (ref 38–126)
ALT: 9 U/L (ref 0–44)
AST: 25 U/L (ref 15–41)
Anion gap: 11 (ref 5–15)
BUN: 25 mg/dL — ABNORMAL HIGH (ref 8–23)
CO2: 29 mmol/L (ref 22–32)
Calcium: 9.9 mg/dL (ref 8.9–10.3)
Chloride: 94 mmol/L — ABNORMAL LOW (ref 98–111)
Creatinine, Ser: 6.6 mg/dL — ABNORMAL HIGH (ref 0.44–1.00)
GFR calc Af Amer: 6 mL/min — ABNORMAL LOW (ref >60)
GFR calc non Af Amer: 5 mL/min — ABNORMAL LOW (ref >60)
GLUCOSE: 85 mg/dL (ref 70–99)
Potassium: 4 mmol/L (ref 3.5–5.1)
Sodium: 134 mmol/L — ABNORMAL LOW (ref 135–145)
Total Bilirubin: 0.8 mg/dL (ref 0.3–1.2)
Total Protein: 9.4 g/dL — ABNORMAL HIGH (ref 6.5–8.1)

## 2018-09-16 LAB — LIPID PANEL
CHOLESTEROL: 135 mg/dL (ref 0–200)
HDL: 60 mg/dL (ref >40)
LDL Cholesterol: 57 mg/dL (ref 0–99)
Total CHOL/HDL Ratio: 2.3 RATIO
Triglycerides: 90 mg/dL (ref <150)
VLDL: 18 mg/dL (ref 0–40)

## 2018-09-16 LAB — I-STAT CHEM 8, ED
BUN: 28 mg/dL — ABNORMAL HIGH (ref 8–23)
CHLORIDE: 97 mmol/L — AB (ref 98–111)
Calcium, Ion: 1.21 mmol/L (ref 1.15–1.40)
Creatinine, Ser: 6.9 mg/dL — ABNORMAL HIGH (ref 0.44–1.00)
Glucose, Bld: 84 mg/dL (ref 70–99)
HCT: 33 % — ABNORMAL LOW (ref 36.0–46.0)
Hemoglobin: 11.2 g/dL — ABNORMAL LOW (ref 12.0–15.0)
Potassium: 3.9 mmol/L (ref 3.5–5.1)
Sodium: 135 mmol/L (ref 135–145)
TCO2: 33 mmol/L — ABNORMAL HIGH (ref 22–32)

## 2018-09-16 LAB — DIFFERENTIAL
Abs Immature Granulocytes: 0.05 10*3/uL (ref 0.00–0.07)
Basophils Absolute: 0 10*3/uL (ref 0.0–0.1)
Basophils Relative: 0 %
Eosinophils Absolute: 0.1 10*3/uL (ref 0.0–0.5)
Eosinophils Relative: 1 %
Immature Granulocytes: 1 %
LYMPHS PCT: 12 %
Lymphs Abs: 0.8 10*3/uL (ref 0.7–4.0)
Monocytes Absolute: 0.8 10*3/uL (ref 0.1–1.0)
Monocytes Relative: 12 %
Neutro Abs: 4.9 10*3/uL (ref 1.7–7.7)
Neutrophils Relative %: 74 %

## 2018-09-16 LAB — CBC
HCT: 31.9 % — ABNORMAL LOW (ref 36.0–46.0)
Hemoglobin: 9.5 g/dL — ABNORMAL LOW (ref 12.0–15.0)
MCH: 29.6 pg (ref 26.0–34.0)
MCHC: 29.8 g/dL — ABNORMAL LOW (ref 30.0–36.0)
MCV: 99.4 fL (ref 80.0–100.0)
Platelets: 118 10*3/uL — ABNORMAL LOW (ref 150–400)
RBC: 3.21 MIL/uL — ABNORMAL LOW (ref 3.87–5.11)
RDW: 19.2 % — ABNORMAL HIGH (ref 11.5–15.5)
WBC: 6.5 10*3/uL (ref 4.0–10.5)
nRBC: 0.6 % — ABNORMAL HIGH (ref 0.0–0.2)

## 2018-09-16 LAB — I-STAT TROPONIN, ED: Troponin i, poc: 0.27 ng/mL (ref 0.00–0.08)

## 2018-09-16 LAB — TROPONIN I
Troponin I: 0.28 ng/mL (ref <0.03)
Troponin I: 0.22 ng/mL (ref <0.03)
Troponin I: 0.15 ng/mL (ref <0.03)

## 2018-09-16 LAB — CBG MONITORING, ED
Glucose-Capillary: 165 mg/dL — ABNORMAL HIGH (ref 70–99)
Glucose-Capillary: 69 mg/dL — ABNORMAL LOW (ref 70–99)
Glucose-Capillary: 79 mg/dL (ref 70–99)
Glucose-Capillary: 93 mg/dL (ref 70–99)

## 2018-09-16 LAB — HEPARIN LEVEL (UNFRACTIONATED)
Heparin Unfractionated: <0.1 IU/mL — ABNORMAL LOW (ref 0.30–0.70)
Heparin Unfractionated: 0.14 IU/mL — ABNORMAL LOW (ref 0.30–0.70)

## 2018-09-16 LAB — I-STAT CG4 LACTIC ACID, ED: Lactic Acid, Venous: 1.31 mmol/L (ref 0.5–1.9)

## 2018-09-16 LAB — PROTIME-INR
INR: 1.15
Prothrombin Time: 14.6 seconds (ref 11.4–15.2)

## 2018-09-16 LAB — MAGNESIUM: Magnesium: 2.8 mg/dL — ABNORMAL HIGH (ref 1.7–2.4)

## 2018-09-16 LAB — APTT: aPTT: 35 seconds (ref 24–36)

## 2018-09-16 LAB — ETHANOL: Alcohol, Ethyl (B): <10 mg/dL (ref <10)

## 2018-09-16 LAB — TSH: TSH: 2.491 u[IU]/mL (ref 0.350–4.500)

## 2018-09-16 MED ORDER — ONDANSETRON HCL 4 MG PO TABS: 4.0000 mg | ORAL_TABLET | Freq: Four times a day (QID) | ORAL | Status: DC | PRN

## 2018-09-16 MED ORDER — AMLODIPINE BESYLATE 10 MG PO TABS
10.0000 mg | ORAL_TABLET | Freq: Every evening | ORAL | Status: DC
  Administered 2018-09-17 – 2018-09-18 (×2): 10 mg via ORAL
  Filled 2018-09-16 (×2): qty 1

## 2018-09-16 MED ORDER — CALCIUM ACETATE (PHOS BINDER) 667 MG PO CAPS
1334.0000 mg | ORAL_CAPSULE | Freq: Three times a day (TID) | ORAL | Status: DC
  Administered 2018-09-17 – 2018-09-19 (×6): 1334 mg via ORAL
  Filled 2018-09-16 (×10): qty 2

## 2018-09-16 MED ORDER — ONDANSETRON HCL 4 MG/2ML IJ SOLN
4.0000 mg | Freq: Once | INTRAMUSCULAR | Status: AC
  Administered 2018-09-17: 4 mg via INTRAVENOUS

## 2018-09-16 MED ORDER — LORAZEPAM 0.5 MG PO TABS
0.5000 mg | ORAL_TABLET | Freq: Every day | ORAL | Status: DC
  Administered 2018-09-17 – 2018-09-18 (×2): 0.5 mg via ORAL
  Filled 2018-09-16 (×2): qty 1

## 2018-09-16 MED ORDER — SODIUM CHLORIDE 0.9% FLUSH: 3.0000 mL | INTRAVENOUS | Status: DC | PRN

## 2018-09-16 MED ORDER — HEPARIN BOLUS VIA INFUSION
1500.0000 [IU] | Freq: Once | INTRAVENOUS | Status: AC
  Administered 2018-09-16: 1500 [IU] via INTRAVENOUS
  Filled 2018-09-16: qty 1500

## 2018-09-16 MED ORDER — SERTRALINE HCL 50 MG PO TABS: 50.0000 mg | ORAL_TABLET | Freq: Every day | ORAL | Status: DC

## 2018-09-16 MED ORDER — HYDRALAZINE HCL 20 MG/ML IJ SOLN
10.0000 mg | INTRAMUSCULAR | Status: DC | PRN
  Administered 2018-09-16 – 2018-09-17 (×4): 10 mg via INTRAVENOUS
  Filled 2018-09-16 (×4): qty 1

## 2018-09-16 MED ORDER — ALTEPLASE 2 MG IJ SOLR: 2.0000 mg | Freq: Once | INTRAMUSCULAR | Status: DC | PRN

## 2018-09-16 MED ORDER — CLEVIDIPINE BUTYRATE 0.5 MG/ML IV EMUL
0.0000 mg/h | INTRAVENOUS | Status: DC
  Filled 2018-09-16: qty 50

## 2018-09-16 MED ORDER — CLONIDINE HCL 0.1 MG PO TABS
0.1000 mg | ORAL_TABLET | Freq: Three times a day (TID) | ORAL | Status: DC | PRN
  Administered 2018-09-19: 0.1 mg via ORAL
  Filled 2018-09-16: qty 1

## 2018-09-16 MED ORDER — VALACYCLOVIR HCL 500 MG PO TABS
500.0000 mg | ORAL_TABLET | ORAL | Status: DC
  Administered 2018-09-18: 500 mg via ORAL
  Filled 2018-09-16 (×2): qty 1

## 2018-09-16 MED ORDER — ACETAMINOPHEN 650 MG RE SUPP: 650.0000 mg | Freq: Four times a day (QID) | RECTAL | Status: DC | PRN

## 2018-09-16 MED ORDER — LATANOPROST 0.005 % OP SOLN
1.0000 [drp] | Freq: Every day | OPHTHALMIC | Status: DC
  Administered 2018-09-17 – 2018-09-18 (×2): 1 [drp] via OPHTHALMIC
  Filled 2018-09-16: qty 2.5

## 2018-09-16 MED ORDER — LORAZEPAM 2 MG/ML IJ SOLN
0.5000 mg | Freq: Once | INTRAMUSCULAR | Status: AC
  Administered 2018-09-16: 0.5 mg via INTRAVENOUS

## 2018-09-16 MED ORDER — SODIUM CHLORIDE 0.9 % IV SOLN: 100.0000 mL | INTRAVENOUS | Status: DC | PRN

## 2018-09-16 MED ORDER — SODIUM CHLORIDE 0.9% FLUSH
3.0000 mL | Freq: Two times a day (BID) | INTRAVENOUS | Status: DC
  Administered 2018-09-17 – 2018-09-19 (×4): 3 mL via INTRAVENOUS

## 2018-09-16 MED ORDER — CHLORHEXIDINE GLUCONATE CLOTH 2 % EX PADS
6.0000 | MEDICATED_PAD | Freq: Every day | CUTANEOUS | Status: DC
  Administered 2018-09-17 – 2018-09-19 (×3): 6 via TOPICAL

## 2018-09-16 MED ORDER — HEPARIN (PORCINE) 25000 UT/250ML-% IV SOLN
1350.0000 [IU]/h | INTRAVENOUS | Status: DC
  Administered 2018-09-16 (×2): 650 [IU]/h via INTRAVENOUS
  Administered 2018-09-17: 1050 [IU]/h via INTRAVENOUS
  Administered 2018-09-18: 1350 [IU]/h via INTRAVENOUS
  Filled 2018-09-16 (×4): qty 250

## 2018-09-16 MED ORDER — SODIUM CHLORIDE 0.9 % IV SOLN: 250.0000 mL | INTRAVENOUS | Status: DC | PRN

## 2018-09-16 MED ORDER — LAMOTRIGINE 100 MG PO TABS
50.0000 mg | ORAL_TABLET | Freq: Every day | ORAL | Status: DC
  Administered 2018-09-18 – 2018-09-19 (×2): 50 mg via ORAL
  Filled 2018-09-16: qty 2
  Filled 2018-09-16 (×3): qty 1

## 2018-09-16 MED ORDER — HEPARIN BOLUS VIA INFUSION
1000.0000 [IU] | Freq: Once | INTRAVENOUS | Status: AC
  Administered 2018-09-16: 1000 [IU] via INTRAVENOUS
  Filled 2018-09-16: qty 1000

## 2018-09-16 MED ORDER — LIDOCAINE-PRILOCAINE 2.5-2.5 % EX CREA
1.0000 "application " | TOPICAL_CREAM | CUTANEOUS | Status: DC | PRN
  Filled 2018-09-16: qty 5

## 2018-09-16 MED ORDER — LOSARTAN POTASSIUM 50 MG PO TABS
50.0000 mg | ORAL_TABLET | Freq: Every evening | ORAL | Status: DC
  Administered 2018-09-17 – 2018-09-18 (×2): 50 mg via ORAL
  Filled 2018-09-16 (×2): qty 1

## 2018-09-16 MED ORDER — LIDOCAINE HCL (PF) 1 % IJ SOLN: 5.0000 mL | INTRAMUSCULAR | Status: DC | PRN

## 2018-09-16 MED ORDER — HEPARIN SODIUM (PORCINE) 1000 UNIT/ML DIALYSIS
1000.0000 [IU] | INTRAMUSCULAR | Status: DC | PRN
  Administered 2018-09-17: 1000 [IU] via INTRAVENOUS_CENTRAL
  Filled 2018-09-16 (×2): qty 1

## 2018-09-16 MED ORDER — SODIUM CHLORIDE 0.9% FLUSH
3.0000 mL | Freq: Two times a day (BID) | INTRAVENOUS | Status: DC
  Administered 2018-09-18: 3 mL via INTRAVENOUS

## 2018-09-16 MED ORDER — LABETALOL HCL 5 MG/ML IV SOLN
20.0000 mg | Freq: Once | INTRAVENOUS | Status: AC
  Administered 2018-09-16: 20 mg via INTRAVENOUS
  Filled 2018-09-16: qty 4

## 2018-09-16 MED ORDER — SEVELAMER CARBONATE 800 MG PO TABS
2400.0000 mg | ORAL_TABLET | Freq: Three times a day (TID) | ORAL | Status: DC
  Administered 2018-09-17 – 2018-09-19 (×6): 2400 mg via ORAL
  Filled 2018-09-16 (×10): qty 3

## 2018-09-16 MED ORDER — ONDANSETRON HCL 4 MG/2ML IJ SOLN: 4.0000 mg | Freq: Four times a day (QID) | INTRAMUSCULAR | Status: DC | PRN

## 2018-09-16 MED ORDER — LORAZEPAM 2 MG/ML IJ SOLN
INTRAMUSCULAR | Status: AC
  Filled 2018-09-16: qty 1

## 2018-09-16 MED ORDER — ACETAMINOPHEN 325 MG PO TABS
650.0000 mg | ORAL_TABLET | Freq: Four times a day (QID) | ORAL | Status: DC | PRN
  Administered 2018-09-19: 650 mg via ORAL
  Filled 2018-09-16: qty 2

## 2018-09-16 MED ORDER — FAMOTIDINE 20 MG PO TABS: 40.0000 mg | ORAL_TABLET | Freq: Every day | ORAL | Status: DC

## 2018-09-16 MED ORDER — PENTAFLUOROPROP-TETRAFLUOROETH EX AERO: 1.0000 "application " | INHALATION_SPRAY | CUTANEOUS | Status: DC | PRN

## 2018-09-16 MED ORDER — ISOSORBIDE MONONITRATE ER 60 MG PO TB24
60.0000 mg | ORAL_TABLET | Freq: Every day | ORAL | Status: DC
  Administered 2018-09-18 – 2018-09-19 (×2): 60 mg via ORAL
  Filled 2018-09-16 (×3): qty 1

## 2018-09-16 MED ORDER — DEXTROSE 50 % IV SOLN
INTRAVENOUS | Status: AC
  Administered 2018-09-16: 50 mL
  Filled 2018-09-16: qty 50

## 2018-09-16 MED ORDER — SENNOSIDES-DOCUSATE SODIUM 8.6-50 MG PO TABS: 1.0000 | ORAL_TABLET | Freq: Every evening | ORAL | Status: DC | PRN

## 2018-09-16 NOTE — Consult Note (Addendum)
[facility] Renal Consultation Note    Indication for Consultation:  Management of ESRD/hemodialysis; anemia, hypertension/volume and secondary hyperparathyroidism   HPI: [name] is a 77 y.o. female with ESRD on HD TTS, HTN, multiple myeloma (followed by [facility] oncology), dementia.  Recent [facility] adm 11/25-12/3/19 with Achromobacter bacteremia treated with 2 week course of antibiotics completed 12/11. She did have palliative consult and family decided to continue full scope of care.   Admitted with AMS and weakness. Per notes was found to be nonverbal with right-sided facial droop last evening and EMS called. Very hypertensive on arrival. Neurology consulted. Head CT and MRI with no acute CVA. No seizure on EEG. Labs showing Na 133, K 5.0, CO2 28, Glucose 81, BUN 28, Cr 6.72 Ca 9.5, Troponin elevated 0.28>0.22.  Mild vascular congestion on CXR. Blood cultures collected.   Seen and examined in ED. She is nonverbal and does not follow commands. She opens her eyes and looks around then goes back to snoring. On heparin gtt. BP 136/101 in ED.   Dialyzes TThS at [facility]. Last HD was Saturday. She completed a full treatment. She has not missed any treatments since her last admission.  Due for routine dialysis today.   Past Medical History:  Diagnosis Date  . Anemia    from dx list from [place]  . Anxiety   . Closed fracture of right distal femur ([place]) 09/01/2015  . Depression   . ESRD (end stage renal disease) on dialysis [facility])    "TTS; [place]" (04/24/2017)  . GERD (gastroesophageal reflux disease) 04/23/2015  . HCAP (healthcare-associated pneumonia) 04/24/2017  . Heart murmur   . History of blood transfusion    "low HgB when I was going to dialysis center"  . Hypertension   . Hypertension associated with end stage renal disease on dialysis 03/11/2014  . Malnutrition of moderate degree 09/02/2015  . Multiple myeloma ([place]) 03/11/2014  . Patient is Jehovah's Witness    "I'd  rather never have any blood transfusions unless absolutely necessary; please check with me 1st". (04/24/2017)  . Pneumonia ~ 2016  . Transfusion history    08/25/2018 hemoglobin 7.1,+ FOB, status post 2 units packed cells   Past Surgical History:  Procedure Laterality Date  . AV FISTULA PLACEMENT Left   . I&D EXTREMITY Left 02/27/2016   Procedure: ARTHROSCOPIC IRRIGATION AND DEBRIDEMENT EXTREMITY;  Surgeon: Renette Butters, MD;  Location: [place];  Service: Orthopedics;  Laterality: Left;  . IR DIALY SHUNT INTRO NEEDLE/INTRACATH INITIAL W/IMG LEFT Left 08/29/2018  . IR FLUORO GUIDE CV LINE RIGHT  08/29/2018  . IR US GUIDE VASC ACCESS RIGHT  08/29/2018  . TEE WITHOUT CARDIOVERSION N/A 02/29/2016   Procedure: TRANSESOPHAGEAL ECHOCARDIOGRAM (TEE);  Surgeon: Thayer Headings, MD;  Location: [facility] ENDOSCOPY;  Service: Cardiovascular;  Laterality: N/A;   Family History  Problem Relation Age of Onset  . Hypertension Mother   . Hypertension Father    Social History:  reports that she has never smoked. She has never used smokeless tobacco. She reports that she does not drink alcohol or use drugs. Allergies  Allergen Reactions  . Phenergan [Promethazine Hcl] Other (See Comments)    Acute encephalopathy in the context of refusal to go to hemodialysis and administration of Phenergan for nausea and vomiting   Prior to Admission medications   Medication Sig Start Date End Date Taking? Authorizing Provider  acetaminophen (TYLENOL) 325 MG tablet Take 650 mg by mouth every 6 (six) hours as needed for  fever or headache (pain). Do not exceed 3000 mg in 24 hours   Yes [provider]  amLODipine (NORVASC) 10 MG tablet Take 10 mg by mouth every evening. Hold for SBP <100MM/HG   Yes [provider]  bisacodyl (DULCOLAX) 10 MG suppository Place 10 mg rectally daily as needed for moderate constipation (constipation not relieved by MOM).   Yes [provider]  calcium acetate (PHOSLO) 667  MG capsule Take 1,334 mg by mouth 3 (three) times daily with meals.   Yes [provider]  cloNIDine (CATAPRES) 0.1 MG tablet Take 0.1 mg by mouth every 8 (eight) hours as needed (sbp >180).    Yes [provider]  hydrocortisone cream 1 % Apply 1 application topically daily as needed for itching.   Yes [provider]  isosorbide mononitrate (IMDUR) 60 MG 24 hr tablet Take 60 mg by mouth daily.  03/24/15  Yes [provider]  lamoTRIgine (LAMICTAL) 25 MG tablet Take 50 mg by mouth daily.    Yes [provider]  latanoprost (XALATAN) 0.005 % ophthalmic solution Place 1 drop into both eyes at bedtime.    Yes [provider]  loperamide (IMODIUM A-D) 2 MG tablet Take 4 mg by mouth daily as needed (on Dialysis days).    Yes [provider]  LORazepam (ATIVAN) 0.5 MG tablet Take 1 tablet (0.5 mg total) by mouth at bedtime. 09/12/18  Yes Medina-Vargas, Monina C, NP  losartan (COZAAR) 50 MG tablet Take 50 mg by mouth every evening.   Yes [provider]  multivitamin (RENA-VIT) TABS tablet Take 1 tablet by mouth daily.   Yes [provider]  ranitidine (ZANTAC) 300 MG tablet Take 300 mg by mouth at bedtime. Reported on 03/05/2016   Yes [provider]  sertraline (ZOLOFT) 50 MG tablet Take 50 mg by mouth daily.   Yes [provider]  sevelamer carbonate (RENVELA) 800 MG tablet Take 2 tablets (1,600 mg total) by mouth 3 (three) times daily with meals. Patient taking differently: Take 2,400 mg by mouth 3 (three) times daily with meals.  05/30/18  Yes Emokpae, Courage, MD  simethicone (MYLICON) 595 MG chewable tablet Chew 125 mg by mouth daily. 6PM   Yes [provider]  Sodium Phosphates (RA SALINE ENEMA) 19-7 GM/118ML ENEM Place 1 each rectally as needed (for constipation).   Yes [provider]  valACYclovir (VALTREX) 500 MG tablet Take 1 tablet (500 mg total) by mouth every other day. 03/01/16   Yes Hongalgi, Lenis Dickinson, MD   Current Facility-Administered Medications  Medication Dose Route Frequency Provider Last Rate Last Dose  . 0.9 %  sodium chloride infusion  250 mL Intravenous PRN Opyd, Ilene Qua, MD      . acetaminophen (TYLENOL) tablet 650 mg  650 mg Oral Q6H PRN Opyd, Ilene Qua, MD       Or  . acetaminophen (TYLENOL) suppository 650 mg  650 mg Rectal Q6H PRN Opyd, Ilene Qua, MD      . calcium acetate (PHOSLO) capsule 1,334 mg  1,334 mg Oral TID WC Opyd, Ilene Qua, MD      . Derrill Memo ON 09/17/2018] Chlorhexidine Gluconate Cloth 2 % PADS 6 each  6 each Topical Q0600 Lynnda Child, PA-C      . heparin ADULT infusion 100 units/mL (25000 units/249m sodium chloride 0.45%)  850 Units/hr Intravenous Continuous MHarvel Quale RPH 8.5 mL/hr at 09/16/18 1143 850 Units/hr at 09/16/18 1143  . hydrALAZINE (APRESOLINE)  injection 10 mg  10 mg Intravenous Q4H PRN Opyd, Ilene Qua, MD   10 mg at 09/16/18 1059  . isosorbide mononitrate (IMDUR) 24 hr tablet 60 mg  60 mg Oral Daily Opyd, Ilene Qua, MD      . lamoTRIgine (LAMICTAL) tablet 50 mg  50 mg Oral Daily Opyd, Ilene Qua, MD      . latanoprost (XALATAN) 0.005 % ophthalmic solution 1 drop  1 drop Both Eyes QHS Opyd, Timothy S, MD      . LORazepam (ATIVAN) tablet 0.5 mg  0.5 mg Oral QHS Opyd, Ilene Qua, MD      . senna-docusate (Senokot-S) tablet 1 tablet  1 tablet Oral QHS PRN Opyd, Ilene Qua, MD      . sevelamer carbonate (RENVELA) tablet 2,400 mg  2,400 mg Oral TID WC Opyd, Ilene Qua, MD      . sodium chloride flush (NS) 0.9 % injection 3 mL  3 mL Intravenous Q12H Opyd, Timothy S, MD      . sodium chloride flush (NS) 0.9 % injection 3 mL  3 mL Intravenous Q12H Opyd, Timothy S, MD      . sodium chloride flush (NS) 0.9 % injection 3 mL  3 mL Intravenous PRN Opyd, Ilene Qua, MD      . valACYclovir (VALTREX) tablet 500 mg  500 mg Oral Q48H Opyd, Ilene Qua, MD       Current Outpatient Medications  Medication Sig Dispense Refill  .  acetaminophen (TYLENOL) 325 MG tablet Take 650 mg by mouth every 6 (six) hours as needed for fever or headache (pain). Do not exceed 3000 mg in 24 hours    . amLODipine (NORVASC) 10 MG tablet Take 10 mg by mouth every evening. Hold for SBP <100MM/HG    . bisacodyl (DULCOLAX) 10 MG suppository Place 10 mg rectally daily as needed for moderate constipation (constipation not relieved by MOM).    . calcium acetate (PHOSLO) 667 MG capsule Take 1,334 mg by mouth 3 (three) times daily with meals.    . cloNIDine (CATAPRES) 0.1 MG tablet Take 0.1 mg by mouth every 8 (eight) hours as needed (sbp >180).     . hydrocortisone cream 1 % Apply 1 application topically daily as needed for itching.    . isosorbide mononitrate (IMDUR) 60 MG 24 hr tablet Take 60 mg by mouth daily.     Marland Kitchen lamoTRIgine (LAMICTAL) 25 MG tablet Take 50 mg by mouth daily.     Marland Kitchen latanoprost (XALATAN) 0.005 % ophthalmic solution Place 1 drop into both eyes at bedtime.     Marland Kitchen loperamide (IMODIUM A-D) 2 MG tablet Take 4 mg by mouth daily as needed (on Dialysis days).     . LORazepam (ATIVAN) 0.5 MG tablet Take 1 tablet (0.5 mg total) by mouth at bedtime. 30 tablet 0  . losartan (COZAAR) 50 MG tablet Take 50 mg by mouth every evening.    . multivitamin (RENA-VIT) TABS tablet Take 1 tablet by mouth daily.    . ranitidine (ZANTAC) 300 MG tablet Take 300 mg by mouth at bedtime. Reported on 03/05/2016    . sertraline (ZOLOFT) 50 MG tablet Take 50 mg by mouth daily.    . sevelamer carbonate (RENVELA) 800 MG tablet Take 2 tablets (1,600 mg total) by mouth 3 (three) times daily with meals. (Patient taking differently: Take 2,400 mg by mouth 3 (three) times daily with meals. ) 60 tablet 2  . simethicone (MYLICON) 856 MG chewable tablet Chew  125 mg by mouth daily. 6PM    . Sodium Phosphates (RA SALINE ENEMA) 19-7 GM/118ML ENEM Place 1 each rectally as needed (for constipation).    . valACYclovir (VALTREX) 500 MG tablet Take 1 tablet (500 mg total) by mouth  every other day.      ROS: Unable to assess.   Physical Exam: Vitals:   09/16/18 1200 09/16/18 1230 09/16/18 1330 09/16/18 1400  BP: (!) 157/112 (!) 136/101 (!) 165/119 (!) 175/129  Pulse: 80 67 71 77  Resp: _0 Temp:      TempSrc:      SpO2: 97% 97% 100% 98%  Weight:      Height:         General: WDWN elderly female NAD  Head: NCAT sclera not icteric MMM Neck: Supple. No JVD  Lungs: CTA bilaterally without wheezes, rales, or rhonchi. Breathing is unlabored. Heart: RRR 2/6 SEM  Abdomen: soft NT + BS Lower extremities:without edema or ischemic changes, no open wounds  Neuro: Does not follow commands. Does not move extremities.  Psych:  Nonverbal. Not responding to questions.  Dialysis Access: R  [facility]. L AVF (clotted, needs new access)  Labs: Basic Metabolic Panel: Recent Labs  Lab 09/16/18 0008 09/16/18 0010 09/16/18 0414  NA 134* 135 133*  K 4.0 3.9 5.0  CL 94* 97* 93*  CO2 29  --  28  GLUCOSE 85 84 81  BUN 25* 28* 28*  CREATININE 6.60* 6.90* 6.72*  CALCIUM 9.9  --  9.5   Liver Function Tests: Recent Labs  Lab 09/16/18 0008  AST 25  ALT 9  ALKPHOS 51  BILITOT 0.8  PROT 9.4*  ALBUMIN 2.5*   No results for input(s): LIPASE, AMYLASE in the last 168 hours. No results for input(s): AMMONIA in the last 168 hours. CBC: Recent Labs  Lab 09/16/18 0008 09/16/18 0010  WBC 6.5  --   NEUTROABS 4.9  --   HGB 9.5* 11.2*  HCT 31.9* 33.0*  MCV 99.4  --   PLT 118*  --    Cardiac Enzymes: Recent Labs  Lab 09/16/18 0414 09/16/18 0956  TROPONINI 0.28* 0.22*   CBG: Recent Labs  Lab 09/16/18 0025 09/16/18 0758 09/16/18 0824 09/16/18 1131  GLUCAP 79 69* 165* 93   Iron Studies: No results for input(s): IRON, TIBC, TRANSFERRIN, FERRITIN in the last 72 hours. Studies/Results: Mr Brain Wo Contrast  Result Date: 09/16/2018 CLINICAL DATA:  Altered mental status and weakness. History of end-stage renal disease on dialysis, multiple myeloma,  hypertension. EXAM: MRI HEAD WITHOUT CONTRAST TECHNIQUE: Multiplanar, multiecho pulse sequences of the brain and surrounding structures were obtained without intravenous contrast. COMPARISON:  CT HEAD September 16, 2018 and MRI head April 16, 2018 FINDINGS: INTRACRANIAL CONTENTS: No reduced diffusion to suggest acute ischemia. Numerous infratentorial to lesser extent supratentorial chronic microhemorrhages. Old small LEFT cerebellar infarct. Hazy T2 hyperintense signal bilateral basal ganglia a increased from prior MRI. 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 Patchy supratentorial white matter FLAIR T2 hyperintensities. The ventricles and sulci are normal for patient's age. No suspicious parenchymal signal, masses, mass effect. No abnormal extra-axial fluid collections. No extra-axial masses. VASCULAR: Normal major intracranial vascular flow voids present at skull base. SKULL AND UPPER CERVICAL SPINE: No abnormal sellar expansion. Mildly reduced diffusion with low ADC values of numerous calvarial metastasis corresponding to known multiple myeloma. Heterogeneous calvarial and cervical spine bone marrow signal consistent with myeloproliferative disease. Craniocervical junction maintained. SINUSES/ORBITS: The  mastoid air-cells and included paranasal sinuses are well-aerated.The included ocular globes and orbital contents are non-suspicious. OTHER: Patient is edentulous. IMPRESSION: 1. No acute infarct. 2. Increased basal ganglia and thalami edema seen with hypertensive encephalopathy, less likely rapidly progressing small vessel ischemic changes. 3. Moderate chronic small vessel ischemic changes. 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 4. Multiple osseous metastasis/multiple myeloma. Electronically Signed   By: Elon Alas M.D.   On: 09/16/2018 05:24   Dg Chest Port 1 View  Result Date: 09/16/2018 CLINICAL DATA:  Altered mental status. EXAM: PORTABLE CHEST 1 VIEW COMPARISON:   Radiographs 08/25/2018 FINDINGS: Right internal jugular dialysis catheter tip in the distal SVC. Unchanged cardiomegaly. Unchanged aortic tortuosity. Minimal vascular congestion without pulmonary edema. No focal airspace disease, large pleural effusion or pneumothorax. No acute osseous abnormalities are seen. IMPRESSION: Stable cardiomegaly. Minimal vascular congestion. Electronically Signed   By: Keith Rake M.D.   On: 09/16/2018 01:57   Ct Head Code Stroke Wo Contrast  Result Date: 09/16/2018 CLINICAL DATA:  Code stroke. RIGHT facial droop. Aphasic. History of multiple myeloma, hypertension. EXAM: CT HEAD WITHOUT CONTRAST TECHNIQUE: Contiguous axial images were obtained from the base of the skull through the vertex without intravenous contrast. COMPARISON:  CT HEAD August 25, 2018 and MRI head April 16, 2016 FINDINGS: Mild motion degraded examination. BRAIN: No intraparenchymal hemorrhage, mass effect nor midline shift. The ventricles and sulci are normal for age. 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 Old small LEFT cerebellar infarct. Patchy supratentorial white matter hypodensities. No acute large vascular territory infarcts. No abnormal extra-axial fluid collections. Basal cisterns are patent. VASCULAR: Mild calcific atherosclerosis of the carotid siphons. SKULL: No skull fracture. Multiple soft tissue masses within the calvarium including LEFT sphenoid eroding the inner and outer tables. No significant scalp soft tissue swelling. SINUSES/ORBITS: Mild paranasal sinus mucosal thickening. Mastoid air cells are well aerated. Soft tissue within the external auditory canals most compatible with cerumen.The included ocular globes and orbital contents are non-suspicious. OTHER: None. ASPECTS [facility] Stroke Program Early CT Score) - Ganglionic level infarction (caudate, lentiform nuclei, internal capsule, insula, M1-M3 cortex): 7 - Supraganglionic infarction (M4-M6 cortex): 3 Total  score (0-10 with 10 being normal): 10 IMPRESSION: 1. Mild motion degraded examination.  No acute intracranial process. 2. ASPECTS is 10. 3. Mild chronic small vessel ischemic changes. 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 4. Multiple osseous lesions consistent with known multiple myeloma. 5. Critical Value/emergent results text paged to [place] via AMION secure system on 09/16/2018 at 12:18 am, including interpreting physician's phone number. Electronically Signed   By: Elon Alas M.D.   On: 09/16/2018 00:18    Dialysis Orders:  AF TTS 3.5h 425/1.5 EDW 58kg 2K/2.25Ca  TDC No heparin Hectorol 2 mcg IV TIW Retacrit 18000 U IV TIW Venofer 164m IV x 5   Assessment/Plan: 1. Acute encephalopathy/weakness -Neuro consulted. CT/MRI neg for acute CVA. No seizure on EEG.  Further w/u per primary. Hx recurrent bacteremia. Blood cultures pending. Dementia at baseline.  2. Elevated troponin. On heparin gtt, trending troponin - per primary  3. ESRD - Labs stable. TTS. HD today on schedule.  4. Hypertension/volume  - Hypertensive on admit. IV meds for now. May improve with UF on HD  5. Anemia  - Hgb 11.2. No ESA needs currently  6. Metabolic bone disease -  Continue VDRA/Renvela binder when eating  7. Nutrition - Add prostat for low albumin  8. Multiple myeloma. Chemo infusions Followed  by oncology [facility]. If this is another episode of bacteremia we should prob try to get chemo on hold.   Lynnda Child PA-C [facility] Pager [phone] 09/16/2018, 2:37 PM   Pt seen, examined and agree w A/P as above.  Kelly Splinter MD [employer] pager [phone]   09/16/2018, 3:59 PM

## 2018-09-16 NOTE — Progress Notes (Signed)
ANTICOAGULATION CONSULT NOTE  Pharmacy Consult for heparin Indication: chest pain/ACS    Patient Measurements: Height: 5\' 3"  (160 cm) Weight: 126 lb 15.8 oz (57.6 kg) IBW/kg (Calculated) : 52.4  Hep Wt: 57.6 kg  Vital Signs: BP: 207/143 (12/17 1910) Pulse Rate: 65 (12/17 1910)  Labs: Recent Labs    09/16/18 0008 09/16/18 0010 09/16/18 0414 09/16/18 0956 09/16/18 1041 09/16/18 1632 09/16/18 1914  HGB 9.5* 11.2*  --   --   --   --   --   HCT 31.9* 33.0*  --   --   --   --   --   PLT 118*  --   --   --   --   --   --   APTT 35  --   --   --   --   --   --   LABPROT 14.6  --   --   --   --   --   --   INR 1.15  --   --   --   --   --   --   HEPARINUNFRC  --   --   --   --  <0.10*  --  0.14*  CREATININE 6.60* 6.90* 6.72*  --   --   --   --   TROPONINI  --   --  0.28* 0.22*  --  0.15*  --     Estimated Creatinine Clearance: 5.6 mL/min (A) (by C-G formula based on SCr of 6.72 mg/dL (H)).   Assessment: 77yo female presented to ED as code stroke, unclear stroke exam vs hypertensive urgency/emergency vs encephalopathy, now pt w/ EKG changes and elevated troponin, to begin heparin.  MRI shows old infarcts but no new CVA this admit - will increase Heparin goal to 0.3-0.7 units/ml due to this.   Heparin level this evening remains SUBtherapeutic though trending up (HL 0.14). No bleeding or issues noted per RN report.   Goal of Therapy:  Heparin level 0.3-0.7 units/ml Monitor platelets by anticoagulation protocol: Yes   Plan:  - Heparin 1500 unit bolus x 1 - Increase Heparin to 1050 units/hr (10.5 ml/hr) - Will continue to monitor for any signs/symptoms of bleeding and will follow up with heparin level in 8 hours   Thank you for allowing pharmacy to be a part of this patient's care.  Alycia Rossetti, PharmD, BCPS Clinical Pharmacist Please check AMION for all [place] numbers 09/16/2018 8:26 PM

## 2018-09-16 NOTE — Evaluation (Signed)
Physical Therapy Treatment Patient Details Name: [name] MRN: [id number] DOB: 07-29-1941 Today's Date: 09/16/2018    History of Present Illness 77 y.o. female with medical history significant for ESRD on HD, depression with anxiety, multiple myeloma on chemotherapy, and uncontrolled hypertension, now presenting to the emergency department for evaluation of altered mental status and weakness.    PT Comments    Orders received for PT evaluation. Patient demonstrates deficits in functional mobility and cognition as indicated below. Given current deficits in conjunction with information from previous admissions, feel patient will be best suited to return to SNF, will defer all further needs back to SNF.     Follow Up Recommendations  SNF     Equipment Recommendations  None recommended by PT    Recommendations for Other Services       Precautions / Restrictions Precautions Precautions: Fall    Mobility  Bed Mobility Overal bed mobility: Needs Assistance Bed Mobility: Supine to Sit;Sit to Supine     Supine to sit: Total assist;+2 for physical assistance Sit to supine: Total assist;+2 for physical assistance   General bed mobility comments: Patient not engaging or following commands to come to EOB or return  Transfers                 General transfer comment: did not attempt  Ambulation/Gait             General Gait Details: not tested, w/c bound baseline per chart review   Stairs             Wheelchair Mobility    Modified Rankin (Stroke Patients Only) Modified Rankin (Stroke Patients Only) Pre-Morbid Rankin Score: Moderately severe disability Modified Rankin: Severe disability     Balance Overall balance assessment: Needs assistance   Sitting balance-Leahy Scale: Poor Sitting balance - Comments: reliance on external support, noted some attempts to engage and correct balance  at EOB                                     Cognition Arousal/Alertness: Awake/alert   Overall Cognitive Status: Difficult to assess Area of Impairment: Following commands                       Following Commands: (followed 1step commands x3, otherwise not following commmand)              Exercises      General Comments        Pertinent Vitals/Pain Pain Assessment: Faces Faces Pain Scale: Hurts even more Pain Location: unclear Pain Descriptors / Indicators: Crying Pain Intervention(s): Monitored during session;Repositioned    Home Living Family/patient expects to be discharged to:: Skilled nursing facility               Additional Comments: [facility]    Prior Function Level of Independence: Needs assistance  Gait / Transfers Assistance Needed: Uses w/c for mobility per daughter and her. Mod I for transfers but daughter reports falls.   Comments: information obtained bia chart review from 8/19   PT Goals (current goals can now be found in the care plan section) Acute Rehab PT Goals PT Goal Formulation: Patient unable to participate in goal setting Potential to Achieve Goals: Poor    Frequency           PT Plan      Co-evaluation  AM-PAC PT "6 Clicks" Mobility   Outcome Measure  Help needed turning from your back to your side while in a flat bed without using bedrails?: Total Help needed moving from lying on your back to sitting on the side of a flat bed without using bedrails?: Total Help needed moving to and from a bed to a chair (including a wheelchair)?: Total Help needed standing up from a chair using your arms (e.g., wheelchair or bedside chair)?: Total Help needed to walk in hospital room?: Total Help needed climbing 3-5 steps with a railing? : Total 6 Click Score: 6    End of Session Equipment Utilized During Treatment: Gait belt Activity Tolerance: Other (comment)(inability to participate) Patient left: in bed;with call bell/phone within reach Nurse  Communication: Mobility status PT Visit Diagnosis: Muscle weakness (generalized) (M62.81)     Time: 2518-9842 PT Time Calculation (min) (ACUTE ONLY): 16 min  Charges:                        [name], PT DPT  Board Certified Neurologic Specialist Acute Rehabilitation Services Pager [phone] Office [phone]    [name] 09/16/2018, 11:23 AM

## 2018-09-16 NOTE — Progress Notes (Signed)
ANTICOAGULATION CONSULT NOTE - Initial Consult  Pharmacy Consult for heparin Indication: chest pain/ACS in setting of possible stroke/TIA  Allergies  Allergen Reactions  . Phenergan [Promethazine Hcl] Other (See Comments)    Acute encephalopathy in the context of refusal to go to hemodialysis and administration of Phenergan for nausea and vomiting    Patient Measurements: Height: _0  (160 cm) Weight: 126 lb 15.8 oz (57.6 kg) IBW/kg (Calculated) : 52.4  Vital Signs: Temp: 97.5 F (36.4 C) (12/17 0026) Temp Source: Axillary (12/17 0026) BP: 196/126 (12/17 0026) Pulse Rate: 79 (12/17 0026)  Labs: Recent Labs    09/16/18 0008 09/16/18 0010  HGB 9.5* 11.2*  HCT 31.9* 33.0*  PLT 118*  --   APTT 35  --   LABPROT 14.6  --   INR 1.15  --   CREATININE  --  6.90*    Estimated Creatinine Clearance: 5.5 mL/min (A) (by C-G formula based on SCr of 6.9 mg/dL (H)).   Medical History: Past Medical History:  Diagnosis Date  . Anxiety   . Closed fracture of right distal femur ([place]) 09/01/2015  . Depression   . ESRD (end stage renal disease) on dialysis [facility])    "TTS; [facility]" (04/24/2017)  . GERD (gastroesophageal reflux disease) 04/23/2015  . HCAP (healthcare-associated pneumonia) 04/24/2017  . Heart murmur   . History of blood transfusion    "low HgB when I was going to dialysis center"  . Hypertension   . Hypertension associated with end stage renal disease on dialysis 03/11/2014  . Malnutrition of moderate degree 09/02/2015  . Multiple myeloma ([place]) 03/11/2014  . Patient is Jehovah's Witness    "I'd rather never have any blood transfusions unless absolutely necessary; please check with me 1st". (04/24/2017)  . Pneumonia ~ 2016  . Transfusion history    08/25/2018 hemoglobin 7.1,+ FOB, status post 2 units packed cells     Assessment: 77yo female presented to ED as code stroke, unclear stroke exam vs hypertensive urgency/emergency vs encephalopathy, now pt w/ EKG  changes and elevated troponin, to begin heparin; will treat conservatively given ongoing stroke/TIA w/u.  Goal of Therapy:  Heparin level 0.3-0.5 units/ml Monitor platelets by anticoagulation protocol: Yes   Plan:  Will start heparin gtt at 650 units/hr without bolus and monitor heparin levels and CBC.  Wynona Neat, PharmD, BCPS  09/16/2018,12:44 AM

## 2018-09-16 NOTE — H&P (Signed)
History and Physical    Gem Conkle GTX:[id number] DOB: 1940/11/06 DOA: 09/16/2018  PCP: Hendricks Limes, MD   Patient coming from: SNF   Chief Complaint: AMS, weakness   HPI: [name] is a 77 y.o. female with medical history significant for ESRD on HD, depression with anxiety, multiple myeloma on chemotherapy, and uncontrolled hypertension, now presenting to the emergency department for evaluation of altered mental status and weakness.  Patient was reportedly in her usual state yesterday evening at around 9:30 PM, but was noted a couple hours later to be nonverbal with right-sided facial droop and bilateral extremity weakness.  There was no fall or trauma reported and the patient had not been complaining of anything per report.  She has had similar episodes with encephalopathy attributed to missed dialysis, and in other cases severe hypertension.  EMS found blood pressure to be 200/120 and daughter into the ED for evaluation.  Patient had been admitted to the hospital until 09/02/2018 with recurrent bacteremia and symptomatic anemia, was transfused with 2 units of RBCs, and completed a course of [place] on 09/10/2018.   ED Course: Upon arrival to the ED, patient is found to be afebrile, saturating low 90s on room air, slightly tachypneic, and hypertensive to 200/120.  EKG features a sinus rhythm with PACs and QTc interval 550 ms.  Chest x-ray is notable for stable cardiomegaly and minimal vascular congestion.  Noncontrast head CT is negative for acute intracranial abnormality.  Chemistry panel is notable for sodium of 134, albumin 2.5, and protein 9.4.  CBC features a chronic normocytic anemia with hemoglobin 9.5 and a worsened thrombocytopenia with platelets 118,000.  Lactic acid is normal and troponin is elevated to 0.27.  Blood cultures were collected and the patient was started on heparin infusion in the ED.  Neurology was consulted by the ED physician and recommended medical admission for  further evaluation and management.  Review of Systems:  Unable to complete ROS secondary to patient's clinical condition.  Past Medical History:  Diagnosis Date  . Anxiety   . Closed fracture of right distal femur ([place]) 09/01/2015  . Depression   . ESRD (end stage renal disease) on dialysis [facility])    "TTS; [facility]" (04/24/2017)  . GERD (gastroesophageal reflux disease) 04/23/2015  . HCAP (healthcare-associated pneumonia) 04/24/2017  . Heart murmur   . History of blood transfusion    "low HgB when I was going to dialysis center"  . Hypertension   . Hypertension associated with end stage renal disease on dialysis 03/11/2014  . Malnutrition of moderate degree 09/02/2015  . Multiple myeloma ([place]) 03/11/2014  . Patient is Jehovah's Witness    "I'd rather never have any blood transfusions unless absolutely necessary; please check with me 1st". (04/24/2017)  . Pneumonia ~ 2016  . Transfusion history    08/25/2018 hemoglobin 7.1,+ FOB, status post 2 units packed cells    Past Surgical History:  Procedure Laterality Date  . AV FISTULA PLACEMENT Left   . I&D EXTREMITY Left 02/27/2016   Procedure: ARTHROSCOPIC IRRIGATION AND DEBRIDEMENT EXTREMITY;  Surgeon: Renette Butters, MD;  Location: [place];  Service: Orthopedics;  Laterality: Left;  . IR DIALY SHUNT INTRO NEEDLE/INTRACATH INITIAL W/IMG LEFT Left 08/29/2018  . IR FLUORO GUIDE CV LINE RIGHT  08/29/2018  . IR US GUIDE VASC ACCESS RIGHT  08/29/2018  . TEE WITHOUT CARDIOVERSION N/A 02/29/2016   Procedure: TRANSESOPHAGEAL ECHOCARDIOGRAM (TEE);  Surgeon: Thayer Headings, MD;  Location: [place];  Service: Cardiovascular;  Laterality: N/A;     reports that she has never smoked. She has never used smokeless tobacco. She reports that she does not drink alcohol or use drugs.  Allergies  Allergen Reactions  . Phenergan [Promethazine Hcl] Other (See Comments)    Acute encephalopathy in the context of refusal to go to hemodialysis and  administration of Phenergan for nausea and vomiting    Family History  Problem Relation Age of Onset  . Hypertension Mother   . Hypertension Father      Prior to Admission medications   Medication Sig Start Date End Date Taking? Authorizing Provider  acetaminophen (TYLENOL) 325 MG tablet Take 650 mg by mouth every 6 (six) hours as needed for fever or headache (pain). Do not exceed 3000 mg in 24 hours   Yes [provider]  amLODipine (NORVASC) 10 MG tablet Take 10 mg by mouth every evening. Hold for SBP <100MM/HG   Yes [provider]  bisacodyl (DULCOLAX) 10 MG suppository Place 10 mg rectally daily as needed for moderate constipation (constipation not relieved by MOM).   Yes [provider]  calcium acetate (PHOSLO) 667 MG capsule Take 1,334 mg by mouth 3 (three) times daily with meals.   Yes [provider]  cloNIDine (CATAPRES) 0.1 MG tablet Take 0.1 mg by mouth every 8 (eight) hours as needed (sbp >180).    Yes [provider]  hydrocortisone cream 1 % Apply 1 application topically daily as needed for itching.   Yes [provider]  isosorbide mononitrate (IMDUR) 60 MG 24 hr tablet Take 60 mg by mouth daily.  03/24/15  Yes [provider]  lamoTRIgine (LAMICTAL) 25 MG tablet Take 50 mg by mouth daily.    Yes [provider]  latanoprost (XALATAN) 0.005 % ophthalmic solution Place 1 drop into both eyes at bedtime.    Yes [provider]  loperamide (IMODIUM A-D) 2 MG tablet Take 4 mg by mouth daily as needed (on Dialysis days).    Yes [provider]  LORazepam (ATIVAN) 0.5 MG tablet Take 1 tablet (0.5 mg total) by mouth at bedtime. 09/12/18  Yes Medina-Vargas, Monina C, NP  losartan (COZAAR) 50 MG tablet Take 50 mg by mouth every evening.   Yes [provider]  multivitamin (RENA-VIT) TABS tablet Take 1 tablet by mouth daily.   Yes [provider]  ranitidine (ZANTAC) 300 MG tablet  Take 300 mg by mouth at bedtime. Reported on 03/05/2016   Yes [provider]  sertraline (ZOLOFT) 50 MG tablet Take 50 mg by mouth daily.   Yes [provider]  sevelamer carbonate (RENVELA) 800 MG tablet Take 2 tablets (1,600 mg total) by mouth 3 (three) times daily with meals. Patient taking differently: Take 2,400 mg by mouth 3 (three) times daily with meals.  05/30/18  Yes Emokpae, Courage, MD  simethicone (MYLICON) 631 MG chewable tablet Chew 125 mg by mouth daily. 6PM   Yes [provider]  Sodium Phosphates (RA SALINE ENEMA) 19-7 GM/118ML ENEM Place 1 each rectally as needed (for constipation).   Yes [provider]  valACYclovir (VALTREX) 500 MG tablet Take 1 tablet (500 mg total) by mouth every other day. 03/01/16  Yes Modena Jansky, MD    Physical Exam: Vitals:   09/16/18 0215 09/16/18 0245 09/16/18 0300 09/16/18 0315  BP: (!) 187/142 (!) 179/119 (!) 166/127 (!) 178/117  Pulse: 79 77 73 72  Resp:    16  Temp:  TempSrc:      SpO2: 98% 94% 93% 93%  Weight:      Height:        Constitutional: obtunded, in no apparent respiratory distress  Eyes: PERTLA, lids and conjunctivae normal ENMT: Mucous membranes are moist. Posterior pharynx clear of any exudate or lesions.   Neck: normal, supple, no masses, no thyromegaly Respiratory: clear to auscultation bilaterally, no wheezing, no crackles. Normal respiratory effort.   Cardiovascular: S1 & S2 heard, regular rate and rhythm. No extremity edema.   Abdomen: No distension, no tenderness, soft. Bowel sounds normal.  Musculoskeletal: no clubbing / cyanosis. No joint deformity upper and lower extremities.  Skin: no significant rashes, lesions, ulcers. Warm, dry, well-perfused. Neurologic: Obtunded. Not following commands. PERRL. Patellar DTR intact. Moving all extremities spontaneously.    Labs on Admission: I have personally reviewed following labs and imaging studies  CBC: Recent Labs  Lab  09/16/18 0008 09/16/18 0010  WBC 6.5  --   NEUTROABS 4.9  --   HGB 9.5* 11.2*  HCT 31.9* 33.0*  MCV 99.4  --   PLT 118*  --    Basic Metabolic Panel: Recent Labs  Lab 09/16/18 0008 09/16/18 0010 09/16/18 0028  NA 134* 135  --   K 4.0 3.9  --   CL 94* 97*  --   CO2 29  --   --   GLUCOSE 85 84  --   BUN 25* 28*  --   CREATININE 6.60* 6.90*  --   CALCIUM 9.9  --   --   MG  --   --  2.8*   GFR: Estimated Creatinine Clearance: 5.5 mL/min (A) (by C-G formula based on SCr of 6.9 mg/dL (H)). Liver Function Tests: Recent Labs  Lab 09/16/18 0008  AST 25  ALT 9  ALKPHOS 51  BILITOT 0.8  PROT 9.4*  ALBUMIN 2.5*   No results for input(s): LIPASE, AMYLASE in the last 168 hours. No results for input(s): AMMONIA in the last 168 hours. Coagulation Profile: Recent Labs  Lab 09/16/18 0008  INR 1.15   Cardiac Enzymes: No results for input(s): CKTOTAL, CKMB, CKMBINDEX, TROPONINI in the last 168 hours. BNP (last 3 results) No results for input(s): PROBNP in the last 8760 hours. HbA1C: No results for input(s): HGBA1C in the last 72 hours. CBG: Recent Labs  Lab 09/16/18 0025  GLUCAP 79   Lipid Profile: No results for input(s): CHOL, HDL, LDLCALC, TRIG, CHOLHDL, LDLDIRECT in the last 72 hours. Thyroid Function Tests: No results for input(s): TSH, T4TOTAL, FREET4, T3FREE, THYROIDAB in the last 72 hours. Anemia Panel: No results for input(s): VITAMINB12, FOLATE, FERRITIN, TIBC, IRON, RETICCTPCT in the last 72 hours. Urine analysis:    Component Value Date/Time   COLORURINE STRAW (A) 09/16/2018 0326   APPEARANCEUR CLEAR 09/16/2018 0326   LABSPEC 1.010 09/16/2018 0326   PHURINE 9.0 (H) 09/16/2018 0326   GLUCOSEU 50 (A) 09/16/2018 0326   HGBUR NEGATIVE 09/16/2018 0326   BILIRUBINUR NEGATIVE 09/16/2018 0326   KETONESUR 5 (A) 09/16/2018 0326   PROTEINUR 100 (A) 09/16/2018 0326   UROBILINOGEN 0.2 04/10/2015 1800   NITRITE NEGATIVE 09/16/2018 0326   LEUKOCYTESUR NEGATIVE  09/16/2018 0326   Sepsis Labs: '@LABRCNTIP' (procalcitonin:4,lacticidven:4) )No results found for this or any previous visit (from the past 240 hour(s)).   Radiological Exams on Admission: Dg Chest Port 1 View  Result Date: 09/16/2018 CLINICAL DATA:  Altered mental status. EXAM: PORTABLE CHEST 1 VIEW COMPARISON:  Radiographs 08/25/2018 FINDINGS: Right internal  jugular dialysis catheter tip in the distal SVC. Unchanged cardiomegaly. Unchanged aortic tortuosity. Minimal vascular congestion without pulmonary edema. No focal airspace disease, large pleural effusion or pneumothorax. No acute osseous abnormalities are seen. IMPRESSION: Stable cardiomegaly. Minimal vascular congestion. Electronically Signed   By: Keith Rake M.D.   On: 09/16/2018 01:57   Ct Head Code Stroke Wo Contrast  Result Date: 09/16/2018 CLINICAL DATA:  Code stroke. RIGHT facial droop. Aphasic. History of multiple myeloma, hypertension. EXAM: CT HEAD WITHOUT CONTRAST TECHNIQUE: Contiguous axial images were obtained from the base of the skull through the vertex without intravenous contrast. COMPARISON:  CT HEAD August 25, 2018 and MRI head April 16, 2016 FINDINGS: Mild motion degraded examination. BRAIN: No intraparenchymal hemorrhage, mass effect nor midline shift. The ventricles and sulci are normal for age. 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 Old small LEFT cerebellar infarct. Patchy supratentorial white matter hypodensities. No acute large vascular territory infarcts. No abnormal extra-axial fluid collections. Basal cisterns are patent. VASCULAR: Mild calcific atherosclerosis of the carotid siphons. SKULL: No skull fracture. Multiple soft tissue masses within the calvarium including LEFT sphenoid eroding the inner and outer tables. No significant scalp soft tissue swelling. SINUSES/ORBITS: Mild paranasal sinus mucosal thickening. Mastoid air cells are well aerated. Soft tissue within the external auditory  canals most compatible with cerumen.The included ocular globes and orbital contents are non-suspicious. OTHER: None. ASPECTS [facility] Stroke Program Early CT Score) - Ganglionic level infarction (caudate, lentiform nuclei, internal capsule, insula, M1-M3 cortex): 7 - Supraganglionic infarction (M4-M6 cortex): 3 Total score (0-10 with 10 being normal): 10 IMPRESSION: 1. Mild motion degraded examination.  No acute intracranial process. 2. ASPECTS is 10. 3. Mild chronic small vessel ischemic changes. 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 4. Multiple osseous lesions consistent with known multiple myeloma. 5. Critical Value/emergent results text paged to [place] via AMION secure system on 09/16/2018 at 12:18 am, including interpreting physician's phone number. Electronically Signed   By: Elon Alas M.D.   On: 09/16/2018 00:18    EKG: Independently reviewed. Sinus rhythm, PAC's, QTc 515 ms.   Assessment/Plan  1. Acute encephalopathy  - Presents from SNF with speech difficulty and weakness  - No acute findings on head CT  - She has had similar presentations in setting of missed HD and severe HTN  - Neurology is consulting and much appreciated  - Plan for MRI brain, EEG if MRI negative, management of severe HTN, check UA and cultures   2. NSTEMI  - Presents with AMS and possible focal weakness, found to have borderline repolarization abnormality on EKG and troponin is elevated to 0.27   - She has been started on IV heparin infusion in ED  - Continue cardiac monitoring, trend troponin, continue IV heparin for now    3. Hypertension with hypertensive urgency  - BP 200/120 range in ED  - Oral medications on hold d/t decreased LOC   - Use hydralazine IVP's as needed for now    4. ESRD  - On TTS HD schedule  - No hyperkalemia, uremia, acidosis, or hypervolemia on admission; BP is elevated as above  - SLIV, continue sevelamer   5. Multiple myeloma  - Follows with  oncology and treated with chemotherapy, last infusion 12/13  - Platelet count decreased on admission without bleeding, other lines stable  - Continue prophylactic Valtrex     6. Anxiety  - Received IV Ativan in ED  - Resume home medications once more  appropriate for a diet   7. Prolonged QT interval  - QTc is 515 ms on admission EKG  - Continue cardiac monitoring, avoid offending medications, check TSH, trend troponin, repeat EKG in am     DVT prophylaxis: IV heparin  Code Status: Full  Family Communication: Discussed with patient  Consults called: Neurology  Admission status: Observation     Vianne Bulls, MD [facility] Pager [phone]  If 7PM-7AM, please contact night-coverage [email or web address] Password [email or web address]  09/16/2018, 4:04 AM

## 2018-09-16 NOTE — Consult Note (Signed)
Neurology Consultation  Reason for Consult: Code stroke Referring Physician: Dr. Dayna Barker  CC: Confusion, right-sided weakness  History is obtained from: EMS, chart  HPI: [name] is a 77 y.o. female who has a past medical history of end-stage renal disease on dialysis Tuesday Thursday Saturday, anemia, hypertension, malnutrition, refractory multiple myeloma on chemotherapy, anxiety, neurocognitive deficits and a recent discharge from [facility] health facility on 09/02/2018 for recurrent Achromobacter bacteremia, who was at a nursing home, and was brought in by EMS after they were called for acute change in her mentation and inability to follow commands. The EMS said the history that they got was from a nursing tech, who had not taken care of the patient before.  According to the nursing home staff, the patient was verbal prior to 9:30 PM and on their rounds later at night, was found to be nonverbal and not following commands. For EMS, the patient was not moving right side to command but she was also not moving much of left side and not following commands.  There was also some right facial droop. Patient was evaluated on the bridge of the ER and had possible right-sided weakness but her exam essentially was more consistent with encephalopathy and was nonfocal. Reviewing the charts of her facility internist, Dr. Linna Darner, the patient has had intermittent episodes of encephalopathy that have been correlating with her dialysis noncompliance as well as antihypertensive noncompliance. Of note, the patient's blood pressure taken by EMS was 220/120. Patient was unable to provide any history.  LKW: 9:30 PM on 09/15/2018 tpa given?: no, nonfocal symptoms, unreliable last known normal and unclear baseline Premorbid modified Rankin scale (mRS):?3-no reliable method to obtain  Attempted to call patient's daughter at [phone], with no response and no voicemail.   ROS: Unable to obtain due to altered mental  status.   Past Medical History:  Diagnosis Date  . Anxiety   . Closed fracture of right distal femur ([place]) 09/01/2015  . Depression   . ESRD (end stage renal disease) on dialysis [facility])    "TTS; [place] [facility]" (04/24/2017)  . GERD (gastroesophageal reflux disease) 04/23/2015  . HCAP (healthcare-associated pneumonia) 04/24/2017  . Heart murmur   . History of blood transfusion    "low HgB when I was going to dialysis center"  . Hypertension   . Hypertension associated with end stage renal disease on dialysis 03/11/2014  . Malnutrition of moderate degree 09/02/2015  . Multiple myeloma ([place]) 03/11/2014  . Patient is Je[employer]'s [employer]    "I'd rather never have any blood transfusions unless absolutely necessary; please check with me 1st". (04/24/2017)  . Pneumonia ~ 2016  . Transfusion history    08/25/2018 hemoglobin 7.1,+ FOB, status post 2 units packed cells    Family History  Problem Relation Age of Onset  . Hypertension Mother   . Hypertension Father     Social History:   reports that she has never smoked. She has never used smokeless tobacco. She reports that she does not drink alcohol or use drugs.  Medications No current facility-administered medications for this encounter.   Current Outpatient Medications:  .  acetaminophen (TYLENOL) 325 MG tablet, Take 650 mg by mouth every 6 (six) hours as needed for fever or headache (pain). Do not exceed 3000 mg in 24 hours, Disp: , Rfl:  .  amLODipine (NORVASC) 10 MG tablet, Take 10 mg by mouth every evening. Hold for SBP <100MM/HG, Disp: , Rfl:  .  bisacodyl (DULCOLAX) 10 MG suppository, Place 10  mg rectally daily as needed for moderate constipation (constipation not relieved by MOM)., Disp: , Rfl:  .  calcium acetate (PHOSLO) 667 MG capsule, Take 1,334 mg by mouth 3 (three) times daily with meals. , Disp: , Rfl:  .  cloNIDine (CATAPRES) 0.1 MG tablet, Take 0.1 mg by mouth every 8 (eight) hours as needed (sbp >180). , Disp: , Rfl:  .   hydrocortisone cream 1 %, Apply 1 application topically daily as needed for itching., Disp: , Rfl:  .  isosorbide mononitrate (IMDUR) 60 MG 24 hr tablet, Take 60 mg by mouth daily. , Disp: , Rfl:  .  lamoTRIgine (LAMICTAL) 25 MG tablet, Take 50 mg by mouth daily. , Disp: , Rfl:  .  latanoprost (XALATAN) 0.005 % ophthalmic solution, Place 1 drop into both eyes at bedtime. , Disp: , Rfl:  .  loperamide (IMODIUM A-D) 2 MG tablet, Take 4 mg by mouth daily as needed (on Dialysis days). , Disp: , Rfl:  .  LORazepam (ATIVAN) 0.5 MG tablet, Take 1 tablet (0.5 mg total) by mouth at bedtime., Disp: 30 tablet, Rfl: 0 .  losartan (COZAAR) 50 MG tablet, Take 50 mg by mouth every evening., Disp: , Rfl:  .  multivitamin (RENA-VIT) TABS tablet, Take 1 tablet by mouth daily., Disp: , Rfl:  .  ranitidine (ZANTAC) 300 MG tablet, Take 300 mg by mouth at bedtime. Reported on 03/05/2016, Disp: , Rfl:  .  sertraline (ZOLOFT) 50 MG tablet, Take 50 mg by mouth daily., Disp: , Rfl:  .  sevelamer carbonate (RENVELA) 800 MG tablet, Take 2 tablets (1,600 mg total) by mouth 3 (three) times daily with meals., Disp: 60 tablet, Rfl: 2 .  simethicone (MYLICON) 270 MG chewable tablet, Chew 125 mg by mouth daily. 6PM, Disp: , Rfl:  .  Sodium Phosphates (FLEET ENEMA RE), Place 1 enema rectally daily as needed (constipation not relieved by bisacodyl suppository)., Disp: , Rfl:  .  valACYclovir (VALTREX) 500 MG tablet, Take 1 tablet (500 mg total) by mouth every other day., Disp: , Rfl:   Exam: Current vital signs: BP (!) 196/126 (BP Location: Right Arm)   Pulse 79   Temp (!) 97.5 F (36.4 C) (Axillary)   Resp 16   SpO2 96%  Vital signs in last 24 hours: Temp:  [97.5 F (36.4 C)] 97.5 F (36.4 C) (12/17 0026) Pulse Rate:  [79] 79 (12/17 0026) Resp:  [16] 16 (12/17 0026) BP: (196)/(126) 196/126 (12/17 0026) SpO2:  [96 %] 96 % (12/17 0026)  GENERAL: Awake, alert in NAD HEENT: - Normocephalic and atraumatic, dry mm, no LN++,  no Thyromegally LUNGS - Clear to auscultation bilaterally with no wheezes CV - S1S2 RRR, no m/r/g, equal pulses bilaterally. ABDOMEN - Soft, nontender, nondistended with normoactive BS Ext: warm, well perfused, intact peripheral pulses, no edema  NEURO:  Mental Status: Awake, alert, times looks at the examiner at other times looks the other side. Language: speech is incomprehensible and severely dysarthric.  I think she did say her name-[name], but was severely dysarthric.  Extremely poor attention concentration.  Could not reliably name.  Could not comprehend simple or complex commands.  Could not repeat any sentences. Cranial Nerves: PERRL with some proptosis in both eyes. EOMI, visual fields full, no facial asymmetry, Motor: Spontaneously moving the left side more vigorously than the right side-all 4 extremities fall to the bed prior to 10 and 5 seconds respectively.  Very difficult to examine as she does not  follow commands and has some paratonia on exam Tone: Paratonia in all fours Sensation-winces and withdraws in all fours Coordination: Did not perform as she is not following commands Gait- deferred-likely nonambulatory at baseline per EMS  NIHSS 1a Level of Conscious.: 0 1b LOC Questions: 2 1c LOC Commands: 2 2 Best Gaze: 0 3 Visual: 0 4 Facial Palsy: 0 5a Motor Arm - left: 2 5b Motor Arm - Right: 2 6a Motor Leg - Left: 2 6b Motor Leg - Right: 2 7 Limb Ataxia: 0 8 Sensory: 0 9 Best Language: 2 10 Dysarthria: 2 11 Extinct. and Inatten.: 0 TOTAL: 16  Labs I have reviewed labs in epic and the results pertinent to this consultation are:  CBC    Component Value Date/Time   WBC 6.5 09/16/2018 0008   RBC 3.21 (L) 09/16/2018 0008   HGB 11.2 (L) 09/16/2018 0010   HGB 11.5 (L) 03/04/2015 0855   HCT 33.0 (L) 09/16/2018 0010   HCT 34.1 05/28/2018 0024   HCT 35.9 03/04/2015 0855   PLT 118 (L) 09/16/2018 0008   PLT 158 03/04/2015 0855   MCV 99.4 09/16/2018 0008   MCV  99.1 03/04/2015 0855   MCH 29.6 09/16/2018 0008   MCHC 29.8 (L) 09/16/2018 0008   RDW 19.2 (H) 09/16/2018 0008   RDW 17.2 (H) 03/04/2015 0855   LYMPHSABS 0.8 09/16/2018 0008   LYMPHSABS 0.8 (L) 03/04/2015 0855   MONOABS 0.8 09/16/2018 0008   MONOABS 0.5 03/04/2015 0855   EOSABS 0.1 09/16/2018 0008   EOSABS 0.0 03/04/2015 0855   BASOSABS 0.0 09/16/2018 0008   BASOSABS 0.0 03/04/2015 0855    CMP     Component Value Date/Time   NA 135 09/16/2018 0010   NA 135 (A) 05/01/2018   NA 135 (L) 02/25/2015 1002   K 3.9 09/16/2018 0010   K 4.2 02/25/2015 1002   CL 97 (L) 09/16/2018 0010   CO2 26 09/02/2018 0418   CO2 29 02/25/2015 1002   GLUCOSE 84 09/16/2018 0010   GLUCOSE 86 02/25/2015 1002   BUN 28 (H) 09/16/2018 0010   BUN 37 (A) 05/01/2018   BUN 22.3 02/25/2015 1002   CREATININE 6.90 (H) 09/16/2018 0010   CREATININE 5.6 (HH) 02/25/2015 1002   CALCIUM 8.4 (L) 09/02/2018 0418   CALCIUM 9.8 02/25/2015 1002   PROT 7.4 08/28/2018 0409   PROT 8.6 (H) 02/25/2015 1002   ALBUMIN 2.1 (L) 08/30/2018 0806   ALBUMIN 3.1 (L) 02/25/2015 1002   AST 15 08/28/2018 0409   AST 16 02/25/2015 1002   ALT 7 08/28/2018 0409   ALT 10 02/25/2015 1002   ALKPHOS 42 08/28/2018 0409   ALKPHOS 55 02/25/2015 1002   BILITOT 0.7 08/28/2018 0409   BILITOT 0.52 02/25/2015 1002   GFRNONAA 5 (L) 09/02/2018 0418   GFRAA 6 (L) 09/02/2018 0418   Imaging I have reviewed the images obtained:  CT-scan of the brain-no acute changes.  No bleed.  Aspects 10.  Chronic white matter disease and brain atrophy.   Assessment:  77 year old female with past medical history of end-stage renal disease on dialysis Tuesday Thursday Saturday, anemia, hypertension, malnutrition, refractory multiple myeloma on chemotherapy, anxiety, neurocognitive deficits and a recent discharge from home health facility on 09/02/2018 for recurrent Achromobacter bacteremia, was a nursing home brought in by EMS for concern for acute stroke as she  had not followed commands and her speech had become dysarthric. On my examination, she did continue to have some dysarthric speech and inability  to follow commands but was otherwise nonfocal. Noncontrast CT of the head was unremarkable. Her last known normal as communicated by EMS per the facility was 9:30 PM but there was no way to corroborate that or her baseline.  I tried calling the daughter on the number left in the chart with the ER secretary as documented in the note, but did not get any further information. Decision made not to TPA due to non-focality of symptoms and intermittent encephalopathy that she has had in conjunction with hypertensive medication noncompliance as well as possible dialysis noncompliance. She did have elevated blood pressures 220/120 on arrival. I am not sure if this is a true stroke or just a hypertensive emergency/urgency.  Stroke is in the differential but exam is very nonfocal.  Toxic metabolic encephalopathy is also on differentials. Other differentials that should be considered are posterior reversible encephalopathy syndrome and seizures in the setting of uncontrolled hypertension.  Impression At this point, differentials are broad including: -Hypertensive encephalopathy -Toxic metabolic encephalopathy - Evaluate for stroke/TIA -Evaluate for seizures and posterior reversible encephalopathy syndrome.  Recommendations: Check basic labs to include CBC, BMP. Check blood cultures Check urinalysis and urine cultures MRI brain without contrast If MRI of the brain is positive for stroke, then she will need a stroke risk factor work-up, but I would hold off on getting that started until the MRI is positive for stroke Consider routine EEG  if the above work-up is negative.  -- Amie Portland, MD [facility] Pager: [phone] If 7pm to 7am, please call on call as listed on AMION.   ADDENDUM Labs positive for elevated trop and possible EKG  abnormality. EDP starting her on heparin gtt for NSTEMI. Will recommend MRI brain to r/o stroke. Also, blood c/s and consider echo as she was bacteremic with a resistant bacteria, and now has elevated trop and ?acute neurological symptoms - r/o Infective endocarditis. EEG in the AM Discussed plan with EDP - [facility] will follow.  -- Amie Portland, MD [facility] Pager: [phone] If 7pm to 7am, please call on call as listed on AMION.

## 2018-09-16 NOTE — ED Provider Notes (Signed)
[name] EMERGENCY DEPARTMENT Provider Note   CSN: [id number] Arrival date & time: 09/15/18  2356     History   Chief Complaint Chief Complaint  [name] presents with  . Code Stroke    LEVEL 5 CAVEAT 2/2 AMS AND ACUITY OF CONDITION CODE STROKE  HPI [name] is a 78 y.o. female.   [name] is a 77 year old female with history of ESRD on hemodialysis, mild dementia with cognitive deficits and dysphagia, multiple myeloma on chemotherapy (last infusion 09/12/18), chronic anemia presents to [facility] emergency department as a code stroke for AMS.  Discharged from [facility] hospital to SNF on 09/02/18 after admission for Recurrent Achromobacter bacteremia.  [facility] with last seen normal at 2130 tonight.  Staff reports that [name] normally moves all of her extremities well and is talkative with clear speech.  She has been aphasic tonight.  Facility believes they noticed a right sided facial droop.  Also reporting new extremity weakness.  She has a history of noncompliance with her dialysis and antihypertensives causing acute encephalopathy.  It is unclear as to when [name] was last dialyzed as [facility] facility was not able to provide this information.  [name] unable to contribute to history secondary to altered mental status.      Past Medical History:  Diagnosis Date  . Anxiety   . Closed fracture of right distal femur ([place]) 09/01/2015  . Depression   . ESRD (end stage renal disease) on dialysis [facility])    "TTS; [facility]" (04/24/2017)  . GERD (gastroesophageal reflux disease) 04/23/2015  . HCAP (healthcare-associated pneumonia) 04/24/2017  . Heart murmur   . History of blood transfusion    "low HgB when I was going to dialysis center"  . Hypertension   . Hypertension associated with end stage renal disease on dialysis 03/11/2014  . Malnutrition of moderate degree 09/02/2015  . Multiple myeloma ([place]) 03/11/2014  . [name]    "I'd rather never  have any blood transfusions unless absolutely necessary; please check with me 1st". (04/24/2017)  . Pneumonia ~ 2016  . Transfusion history    08/25/2018 hemoglobin 7.1,+ FOB, status post 2 units packed cells    [name] Active Problem List   Diagnosis Date Noted  . DNR (do not resuscitate) discussion   . Palliative care by specialist   . End-stage renal disease on hemodialysis ([place])   . Hemodialysis catheter infection ([place])   . Anemia associated with chronic renal failure 08/26/2018  . Anemia 08/26/2018  . Hypothermia 08/26/2018  . Weakness generalized 08/25/2018  . Gram-negative bacteremia   . Acute lower UTI 08/12/2018  . Swelling of joint of left wrist 08/12/2018  . Rectal bleeding 08/12/2018  . Neurocognitive deficits 06/05/2018  . Altered mental status 05/28/2018  . Macrocytosis 05/01/2018  . Hypertension, uncontrolled 04/16/2018  . ESRD needing dialysis ([place]) 04/16/2018  . Encephalopathy acute 04/16/2018  . Anxiety and depression 04/16/2018  . Hypertension, accelerated 04/16/2018  . Pressure ulcer, stage I 04/16/2018  . Abnormal chest x-ray 12/26/2017  . HCAP (healthcare-associated pneumonia) 04/24/2017  . Sepsis ([place]) 04/24/2017  . Nausea and vomiting 04/24/2017  . Thrombocytopenia ([place]) 04/24/2017  . Hyperkalemia 01/16/2017  . IBS (irritable bowel syndrome) 12/27/2016  . Mood disorder ([place]) 07/19/2016  . Diastolic dysfunction 40/98/1191  . Insomnia 04/27/2016  . Hyponatremia 03/12/2016  . Diarrhea 03/12/2016  . Hypertensive urgency 02/18/2016  . Swelling of joint of left knee 02/18/2016  . Leukopenia due to antineoplastic chemotherapy ([place]) 09/03/2015  .  Malnutrition of moderate degree 09/02/2015  . GERD (gastroesophageal reflux disease) 04/23/2015  . 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 Multiple myeloma ([place]) 03/11/2014  . Hypertension associatd with end stage renal disease on dialysis 03/11/2014    Past Surgical History:  Procedure Laterality Date    . AV FISTULA PLACEMENT Left   . I&D EXTREMITY Left 02/27/2016   Procedure: ARTHROSCOPIC IRRIGATION AND DEBRIDEMENT EXTREMITY;  Surgeon: Renette Butters, MD;  Location: [facility];  Service: Orthopedics;  Laterality: Left;  . IR DIALY SHUNT INTRO NEEDLE/INTRACATH INITIAL W/IMG LEFT Left 08/29/2018  . IR FLUORO GUIDE CV LINE RIGHT  08/29/2018  . IR US GUIDE VASC ACCESS RIGHT  08/29/2018  . TEE WITHOUT CARDIOVERSION N/A 02/29/2016   Procedure: TRANSESOPHAGEAL ECHOCARDIOGRAM (TEE);  Surgeon: Thayer Headings, MD;  Location: [facility] ENDOSCOPY;  Service: Cardiovascular;  Laterality: N/A;     OB History   No obstetric history on file.      Home Medications    Prior to Admission medications   Medication Sig Start Date End Date Taking? Authorizing Provider  acetaminophen (TYLENOL) 325 MG tablet Take 650 mg by mouth every 6 (six) hours as needed for fever or headache (pain). Do not exceed 3000 mg in 24 hours    [provider]  amLODipine (NORVASC) 10 MG tablet Take 10 mg by mouth every evening. Hold for SBP <100MM/HG    [provider]  bisacodyl (DULCOLAX) 10 MG suppository Place 10 mg rectally daily as needed for moderate constipation (constipation not relieved by MOM).    [provider]  calcium acetate (PHOSLO) 667 MG capsule Take 1,334 mg by mouth 3 (three) times daily with meals.     [provider]  cloNIDine (CATAPRES) 0.1 MG tablet Take 0.1 mg by mouth every 8 (eight) hours as needed (sbp >180).     [provider]  hydrocortisone cream 1 % Apply 1 application topically daily as needed for itching.    [provider]  isosorbide mononitrate (IMDUR) 60 MG 24 hr tablet Take 60 mg by mouth daily.  03/24/15   [provider]  lamoTRIgine (LAMICTAL) 25 MG tablet Take 50 mg by mouth daily.     [provider]  latanoprost (XALATAN) 0.005 % ophthalmic solution Place 1 drop into both eyes at bedtime.     [provider]   loperamide (IMODIUM A-D) 2 MG tablet Take 4 mg by mouth daily as needed (on Dialysis days).     [provider]  LORazepam (ATIVAN) 0.5 MG tablet Take 1 tablet (0.5 mg total) by mouth at bedtime. 09/12/18   Medina-Vargas, Monina C, NP  losartan (COZAAR) 50 MG tablet Take 50 mg by mouth every evening.    [provider]  multivitamin (RENA-VIT) TABS tablet Take 1 tablet by mouth daily.    [provider]  ranitidine (ZANTAC) 300 MG tablet Take 300 mg by mouth at bedtime. Reported on 03/05/2016    [provider]  sertraline (ZOLOFT) 50 MG tablet Take 50 mg by mouth daily.    [provider]  sevelamer carbonate (RENVELA) 800 MG tablet Take 2 tablets (1,600 mg total) by mouth 3 (three) times daily with meals. 05/30/18   Roxan Hockey, MD  simethicone (MYLICON) 423 MG chewable tablet Chew 125 mg by mouth daily. 6PM    [provider]  Sodium Phosphates (FLEET ENEMA RE) Place 1 enema rectally daily as needed (constipation not relieved by bisacodyl suppository).    [provider]  valACYclovir (VALTREX) 500 MG tablet Take 1 tablet (500 mg total) by mouth every other day. 03/01/16   Hongalgi, Lenis Dickinson, MD    Family History Family History  Problem Relation Age of Onset  . Hypertension Mother   . Hypertension Father     Social History Social History   Tobacco Use  . Smoking status: Never Smoker  . Smokeless tobacco: Never Used  Substance Use Topics  . Alcohol use: No    Alcohol/week: 0.0 standard drinks  . Drug use: No     Allergies   Phenergan [promethazine hcl]   Review of Systems Review of Systems  Unable to perform ROS: Mental status change     Physical Exam Updated Vital Signs BP (!) 171/131 (BP Location: Right Arm)   Pulse 80   Temp (!) 97.5 F (36.4 C) (Axillary)   Resp 16   Ht _0  (1.6 m)   Wt 57.6 kg   SpO2 99%   BMI 22.49 kg/m    Physical Exam Vitals signs and nursing note reviewed.   Constitutional:      Comments: Alert, mumbling.  HENT:     Head: Normocephalic and atraumatic.     Mouth/Throat:     Mouth: Mucous membranes are moist.  Eyes:     Extraocular Movements: Extraocular movements intact.     Conjunctiva/sclera: Conjunctivae normal.     Pupils: Pupils are equal, round, and reactive to light.     Comments: Appears to have full EOMs; no gaze preference.  Neck:     Musculoskeletal: Normal range of motion.  Cardiovascular:     Rate and Rhythm: Normal rate and regular rhythm.     Pulses: Normal pulses.  Pulmonary:     Effort: Pulmonary effort is normal. No respiratory distress.     Breath sounds: No stridor. No wheezing or rhonchi.     Comments: Respirations even and unlabored. Lungs CTAB. Skin:    General: Skin is warm and dry.  Neurological:     Mental Status: She is alert. She is disoriented.     Motor: Weakness present.     Comments: [name] mumbling, following little commands. Does move all of her extremities spontaneously, but little strength against gravity.      ED Treatments / Results  Labs (all labs ordered are listed, but only abnormal results are displayed) Labs Reviewed  CBC - Abnormal; Notable for Carrie following components:      Result Value   RBC 3.21 (*)    Hemoglobin 9.5 (*)    HCT 31.9 (*)    MCHC 29.8 (*)    RDW 19.2 (*)    Platelets 118 (*)    nRBC 0.6 (*)    All other components within normal limits  COMPREHENSIVE METABOLIC PANEL - Abnormal; Notable for Carrie following components:   Sodium 134 (*)    Chloride 94 (*)    BUN 25 (*)    Creatinine, Ser 6.60 (*)    Total Protein 9.4 (*)    Albumin 2.5 (*)    GFR calc non Af Amer 5 (*)    GFR calc Af Amer 6 (*)    All other components within normal limits  MAGNESIUM - Abnormal; Notable for Carrie following components:   Magnesium 2.8 (*)    All other components within normal limits  I-STAT CHEM 8, ED - Abnormal; Notable for Carrie following components:   Chloride 97 (*)    BUN  28 (*)  Creatinine, Ser 6.90 (*)    TCO2 33 (*)    Hemoglobin 11.2 (*)    HCT 33.0 (*)    All other components within normal limits  I-STAT TROPONIN, ED - Abnormal; Notable for Carrie following components:   Troponin i, poc 0.27 (*)    All other components within normal limits  CULTURE, BLOOD (ROUTINE X 2)  CULTURE, BLOOD (ROUTINE X 2)  URINE CULTURE  ETHANOL  PROTIME-INR  APTT  DIFFERENTIAL  RAPID URINE DRUG SCREEN, HOSP PERFORMED  URINALYSIS, ROUTINE W REFLEX MICROSCOPIC  HEPARIN LEVEL (UNFRACTIONATED)  CBG MONITORING, ED  I-STAT CG4 LACTIC ACID, ED    EKG EKG Interpretation  Date/Time:  Tuesday September 16 2018 00:22:33 EST Ventricular Rate:  77 PR Interval:    QRS Duration: 97 QT Interval:  455 QTC Calculation: 515 R Axis:   -31 Text Interpretation:  Sinus rhythm Supraventricular bigeminy Left axis deviation Borderline repolarization abnormality Prolonged QT interval mild ST depression in V4-5 new from 12/1 Confirmed by Merrily Pew [phone]) on 09/16/2018 12:59:26 AM   Radiology Dg Chest Port 1 View  Result Date: 09/16/2018 CLINICAL DATA:  Altered mental status. EXAM: PORTABLE CHEST 1 VIEW COMPARISON:  Radiographs 08/25/2018 FINDINGS: Right internal jugular dialysis catheter tip in Carrie distal SVC. Unchanged cardiomegaly. Unchanged aortic tortuosity. Minimal vascular congestion without pulmonary edema. No focal airspace disease, large pleural effusion or pneumothorax. No acute osseous abnormalities are seen. IMPRESSION: Stable cardiomegaly. Minimal vascular congestion. Electronically Signed   By: Keith Rake M.D.   On: 09/16/2018 01:57   Ct Head Code Stroke Wo Contrast  Result Date: 09/16/2018 CLINICAL DATA:  Code stroke. RIGHT facial droop. Aphasic. History of multiple myeloma, hypertension. EXAM: CT HEAD WITHOUT CONTRAST TECHNIQUE: Contiguous axial images were obtained from Carrie base of Carrie skull through Carrie vertex without intravenous contrast. COMPARISON:  CT  HEAD August 25, 2018 and MRI head April 16, 2016 FINDINGS: Mild motion degraded examination. BRAIN: No intraparenchymal hemorrhage, mass effect nor midline shift. Carrie ventricles and sulci are normal for age. 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 Old small LEFT cerebellar infarct. Patchy supratentorial white matter hypodensities. No acute large vascular territory infarcts. No abnormal extra-axial fluid collections. Basal cisterns are patent. VASCULAR: Mild calcific atherosclerosis of Carrie carotid siphons. SKULL: No skull fracture. Multiple soft tissue masses within Carrie calvarium including LEFT sphenoid eroding Carrie inner and outer tables. No significant scalp soft tissue swelling. SINUSES/ORBITS: Mild paranasal sinus mucosal thickening. Mastoid air cells are well aerated. Soft tissue within Carrie external auditory canals most compatible with cerumen.Carrie included ocular globes and orbital contents are non-suspicious. OTHER: None. ASPECTS [facility] Stroke Program Early CT Score) - Ganglionic level infarction (caudate, lentiform nuclei, internal capsule, insula, M1-M3 cortex): 7 - Supraganglionic infarction (M4-M6 cortex): 3 Total score (0-10 with 10 being normal): 10 IMPRESSION: 1. Mild motion degraded examination.  No acute intracranial process. 2. ASPECTS is 10. 3. Mild chronic small vessel ischemic changes. 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 4. Multiple osseous lesions consistent with known multiple myeloma. 5. Critical Value/emergent results text paged to [place] via AMION secure system on 09/16/2018 at 12:18 am, including interpreting physician's phone number. Electronically Signed   By: Elon Alas M.D.   On: 09/16/2018 00:18    Procedures Procedures (including critical care time)   Echocardiogram 08/27/18 Study Conclusions - Left ventricle: Carrie cavity size was normal. There was moderate   concentric hypertrophy. Systolic function was normal. Carrie    estimated ejection  fraction was in Carrie range of 55% to 60%. Wall   motion was normal; there were no regional wall motion   abnormalities. Doppler parameters are consistent with abnormal   left ventricular relaxation (grade 1 diastolic dysfunction). - Aortic valve: There was no regurgitation. - Mitral valve: Severely calcified annulus. There was trivial   regurgitation. - Left atrium: Carrie atrium was severely dilated. - Right ventricle: Systolic function was normal. - Right atrium: Carrie atrium was severely dilated. - Atrial septum: No defect or patent foramen ovale was identified. - Tricuspid valve: There was moderate regurgitation. - Pulmonary arteries: Systolic pressure was moderately increased.   PA peak pressure: 44 mm Hg (S).   Medications Ordered in ED Medications  clevidipine (CLEVIPREX) infusion 0.5 mg/mL (0 mg/hr Intravenous Hold 09/16/18 0121)  heparin ADULT infusion 100 units/mL (25000 units/271m sodium chloride 0.45%) (650 Units/hr Intravenous New Bag/Given 09/16/18 0118)  LORazepam (ATIVAN) injection 0.5 mg (0.5 mg Intravenous Given 09/16/18 0105)    CRITICAL CARE Performed by: KAntonietta Breach  Total critical care time: 45 minutes  Critical care time was exclusive of separately billable procedures and treating other patients.  Critical care was necessary to treat or prevent imminent or life-threatening deterioration.  Critical care was time spent personally by me on Carrie following activities: development of treatment plan with [name] and/or surrogate as well as nursing, discussions with consultants, evaluation of [name]'s response to treatment, examination of [name], obtaining history from [name] or surrogate, ordering and performing treatments and interventions, ordering and review of laboratory studies, ordering and review of radiographic studies, pulse oximetry and re-evaluation of [name]'s condition.   Initial Impression / Assessment and Plan / ED Course  I have  reviewed Carrie triage vital signs and Carrie nursing notes.  Pertinent labs & imaging results that were available during my care of [name] were reviewed by me and considered in my medical decision making (see chart for details).     12:20 AM [name] presenting to [facility] ED as a code stroke with facility reporting aphasia and extremity weakness. ?[name] moving all extremities, speech is mumbled. [name] not following commands. She is alert, protecting her airway. Pending CT imaging and labs. Neurology following.  1:07 AM [name] seated upright in bed, keeps repeating "She told me". Difficulty to redirect. Trying to pull at EKG leads and port in R upper chest. Was given 0.557mIV ativan for agitation; this is prescribed to [name] nightly.  1:20 AM Spoke with [name]'s daughter, ReJoseph Artregarding [name]'s presentation to [facility] ED.  States that she tried to call her mother on Monday night and became worried when she did not hear back from her.  Does endorse [name] attending dialysis on Saturday, but [name] reported to her daughter that it "did not go well".  Daughter updated on initial reassuring head CT with elevation in [name]'s heart enzyme with plans to start IV Cleviprex and heparin.  When daughter asked about DNR discussion from prior admission, she emphasizes that [name] is a full code.  2:03 AM Case discussed with Dr. ArRory Percyf neurology.  Recommends MRI without contrast.  If negative, would advise EEG in Carrie morning.  Agrees with plan for admission.  Will consult hospitalist service.  Blood cultures added given recent hospital discharge for recurrent Achromobacter bacteremia.  2:24 AM Case discussed with Dr. OpMyna Hidalgoho will admit.   Final Clinical Impressions(s) / ED Diagnoses   Final diagnoses:  Acute encephalopathy  NSTEMI (non-ST  elevated myocardial infarction) [facility])  Hypertensive crisis    ED Discharge Orders    None         Antonietta Breach, PA-C 09/16/18 0224    Merrily Pew, MD 09/16/18 [phone]

## 2018-09-16 NOTE — Progress Notes (Addendum)
ANTICOAGULATION CONSULT NOTE  Pharmacy Consult for heparin Indication: chest pain/ACS    Patient Measurements: Height: 5' 3" (160 cm) Weight: 126 lb 15.8 oz (57.6 kg) IBW/kg (Calculated) : 52.4  Vital Signs: Temp: 97.5 F (36.4 C) (12/17 0026) Temp Source: Axillary (12/17 0026) BP: 169/127 (12/17 1045) Pulse Rate: 77 (12/17 1045)  Labs: Recent Labs    09/16/18 0008 09/16/18 0010 09/16/18 0414 09/16/18 0956 09/16/18 1041  HGB 9.5* 11.2*  --   --   --   HCT 31.9* 33.0*  --   --   --   PLT 118*  --   --   --   --   APTT 35  --   --   --   --   LABPROT 14.6  --   --   --   --   INR 1.15  --   --   --   --   HEPARINUNFRC  --   --   --   --  <0.10*  CREATININE 6.60* 6.90* 6.72*  --   --   TROPONINI  --   --  0.28* 0.22*  --     Estimated Creatinine Clearance: 5.6 mL/min (A) (by C-G formula based on SCr of 6.72 mg/dL (H)).   Medical History: Past Medical History:  Diagnosis Date  . Anemia    from dx list from [facility]  . Anxiety   . Closed fracture of right distal femur ([place]) 09/01/2015  . Depression   . ESRD (end stage renal disease) on dialysis [facility])    "TTS; [facility]" (04/24/2017)  . GERD (gastroesophageal reflux disease) 04/23/2015  . HCAP (healthcare-associated pneumonia) 04/24/2017  . Heart murmur   . History of blood transfusion    "low HgB when I was going to dialysis center"  . Hypertension   . Hypertension associated with end stage renal disease on dialysis 03/11/2014  . Malnutrition of moderate degree 09/02/2015  . Multiple myeloma ([place]) 03/11/2014  . Patient is Jehovah's Witness    "I'd rather never have any blood transfusions unless absolutely necessary; please check with me 1st". (04/24/2017)  . Pneumonia ~ 2016  . Transfusion history    08/25/2018 hemoglobin 7.1,+ FOB, status post 2 units packed cells     Assessment: 77yo female presented to ED as code stroke, unclear stroke exam vs hypertensive urgency/emergency vs encephalopathy, now pt w/  EKG changes and elevated troponin, to begin heparin.  Initial heparin level is undetectable, discussed with RN, has been infusing appropriately. Patient is ESRD on HD.  Goal of Therapy:  Heparin level 0.3-0.5 units/ml Monitor platelets by anticoagulation protocol: Yes   Plan:  -Heparin bolus 1000 units x1 and increase heparin to 850 units/hr -Daily HL, CBC -Check level in 8 hours   Harvel Quale 09/16/2018 11:41 AM

## 2018-09-16 NOTE — Progress Notes (Signed)
EEG completed; results pending.    

## 2018-09-16 NOTE — ED Notes (Signed)
pts daughter, renee Munnerlyn, would like a call with an update at [phone]

## 2018-09-16 NOTE — ED Notes (Signed)
Stroke Swallow screen not complete.  Pt unable to follow commands.  Speech Therapy consulted

## 2018-09-16 NOTE — ED Notes (Signed)
Pt is medicated per [facility] for agitation at this time.  Pt seems confused and/or aphasic.  Pt is reoriented verbally.  Pt is thrashing arms and legs in bed with minimal control of extremities. Provider at bedside.  Seizure pads placed for pt safety

## 2018-09-16 NOTE — Procedures (Signed)
History: 77 year old female being evaluated for encephalopathy  Sedation: None  Technique: This is a 21 channel routine scalp EEG performed at the bedside with bipolar and monopolar montages arranged in accordance to the international 10/20 system of electrode placement. One channel was dedicated to EKG recording.    Background: The background consists predominantly of generalized irregular delta and theta activity, with a poorly sustained posterior dominant rhythm of 6 to 7 Hz seen at times.  There are occasional generalized discharges with a shifting bifrontal predominance and triphasic morphology, these do not occur in a periodic pattern.  Photic stimulation: Physiologic driving is not performed  EEG Abnormalities: 1) occasional triphasic waves 2) generalized irregular slow activity  Clinical Interpretation: This EEG is consistent with a mild to moderate generalized nonspecific cerebral dysfunction (encephalopathy). There was no seizure or seizure predisposition recorded on this study. Please note that lack of epileptiform activity on EEG does not preclude the possibility of epilepsy.   Roland Rack, MD [facility] [phone]  If 7pm- 7am, please page neurology on call as listed in [place].

## 2018-09-16 NOTE — Progress Notes (Signed)
Patient admitted, can not understand what patient is saying, pt is not following commands, BP elevated doctor aware, new orders given, light on the door is on, bed alarm is on all 4 side rails are up, pt just resting in bed

## 2018-09-16 NOTE — Progress Notes (Addendum)
Patient admitted earlier this morning by Dr. Christia Reading Opyd.  See H&P for details.  77 year old female with history of multiple myeloma currently being followed by oncology [facility], dementia, ESRD, who presented with altered mental status and weakness.  Patient was being worked up for possible CVA however CT and MRI showed no acute CVA.  Patient also had an EEG which was unremarkable for seizure.  No source of infection as chest x-ray and UA are unremarkable.  Currently she is nonverbal on exam and follows no commands.  She does open her eyes and looks around.  She is found to have accelerated hypertension, likely rebound from not getting her home medications.   Discussed with [facility] oncology as MRI showed multiple osseous metastasis/multiple myeloma.  They recommended obtaining multiple myeloma work-up- results can be discussed with [name], NP or Dr. Amalia Hailey, [phone].   Nephrology and neurology consulted for dialysis.  Dannielle Baskins D.O. [facility] Pager [phone]  If 7PM-7AM, please contact night-coverage [email or web address] Password [email or web address] 09/16/2018, 4:16 PM

## 2018-09-16 NOTE — Progress Notes (Addendum)
NEUROLOGY PROGRESS NOTE  Subjective: Patient is alert, trying to get out of bed, making nonsensical speech, does not follow commands, grimaces and withdraws from pain.  Exam: Vitals:   09/16/18 1030 09/16/18 1045  BP: (!) 169/127 (!) 169/127  Pulse: 79 77  Resp: 18 19  Temp:    SpO2: 99% 100%    Physical Exam   HEENT-  Normocephalic, no lesions, without obvious abnormality.  Normal external eye and conjunctiva.   Extremities- Warm, dry and intact Musculoskeletal-no joint tenderness, deformity or swelling Skin-warm and dry, no hyperpigmentation, vitiligo, or suspicious lesions    Neuro:  Mental Status: Alert, not oriented, does not follow commands, making moaning sounds but nonsensical.  Only grimaces to pain Cranial Nerves: II: Still threat III,IV, VI: Extraocular muscles are intact that she can look laterally.  Pupillary responses intact.  V,VII: Not get patient to smile however at rest she looks like she has a left nasolabial fold decrease  VIII: hearing normal bilaterally  Motor: Renal extremity spontaneously Sensory: Responds noxious stimuli Deep Tendon Reflexes: Depressed throughout     Medications:  Prior to Admission: (Not in a hospital admission)  Scheduled: . calcium acetate  1,334 mg Oral TID WC  . isosorbide mononitrate  60 mg Oral Daily  . lamoTRIgine  50 mg Oral Daily  . latanoprost  1 drop Both Eyes QHS  . LORazepam  0.5 mg Oral QHS  . sevelamer carbonate  2,400 mg Oral TID WC  . sodium chloride flush  3 mL Intravenous Q12H  . sodium chloride flush  3 mL Intravenous Q12H  . valACYclovir  500 mg Oral Q48H   Continuous: . sodium chloride    . heparin 850 Units/hr (09/16/18 1143)    Pertinent Labs/Diagnostics: -TSH 2.491 -LDL 57 -UA shows no leukocytes or nitrites - Blood cultures pending -Urine culture pending   -EEG:EEG Abnormalities: 1) occasional triphasic waves 2) generalized irregular slow activity  Clinical Interpretation: This  EEG is consistent with a mild to moderate generalized nonspecific cerebral dysfunction (encephalopathy). There was no seizure or seizure predisposition recorded on this study. Please note that lack of epileptiform activity     Mr Brain Wo Contrast  Result Date: 09/16/2018 CLINICAL DATA:  Altered mental status and weakness. History of end-stage renal disease on dialysis, multiple myeloma, hypertension. EXAM: MRI HEAD WITHOUT CONTRAST TECHNIQUE:  IMPRESSION: 1. No acute infarct. 2. Increased basal ganglia and thalami edema seen with hypertensive encephalopathy, less likely rapidly progressing small vessel ischemic changes. 3. Moderate chronic small vessel ischemic changes. 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 4. Multiple osseous metastasis/multiple myeloma. Electronically Signed   By: Elon Alas M.D.   On: 09/16/2018 05:24   Ct Head Code Stroke Wo Contrast  Result Date: 09/16/2018 CLINICAL DATA:  Code stroke. RIGHT facial droop. Aphasic. History of multiple myeloma, hypertension. EXAM: CT HEAD WITHOUT CONTRAST TECHNIQUE:  IMPRESSION: 1. Mild motion degraded examination.  No acute intracranial process. 2. ASPECTS is 10. 3. Mild chronic small vessel ischemic changes. 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 4. Multiple osseous lesions consistent with known multiple myeloma. 5. Critical Value/emergent results text paged to [place] via AMION secure system on 09/16/2018 at 12:18 am, including interpreting physician's phone number. Electronically Signed   By: Elon Alas M.D.   On: 09/16/2018 00:18     Etta Quill PA-C [facility] [phone]   Assessment: This is a 77 year old female who continues to be encephalopathic and confused without following commands.  MRI does  not show stroke and EEG does not show any active seizure activity.  Currently she is being worked up for a possible heart attack and oncology is trying to be  contacted.  Impression:  -Encephalopathy ? Hypertensive -Possible non-STEMI - Altered mental status  Recommendations: -- Continue aggressive management of blood pressure -Continue infectious work-up -Agree with contacting oncologist   09/16/2018, 11:37 AM  NEUROHOSPITALIST ADDENDUM I have discussed patient with PA- C.   I have reviewed the contents of history and physical exam as documented by PA/ARNP/Resident and agree with above documentation.  I have discussed and formulated the above plan as documented. Edits to the note have been made as needed.    Karena Addison Kamaron Deskins MD [facility] [phone]   If 7pm to 7am, please call on call as listed on AMION.

## 2018-09-16 NOTE — ED Notes (Signed)
Patient transported to MRI 

## 2018-09-16 NOTE — ED Triage Notes (Signed)
Pt BIB [facility] from [facility], [facility] 2130 tonight. Staff reports pt normally moves extremities well and talks with clear speech. Staff noted that pt became aphasic, developed right sided facial droop and extremity weakness.

## 2018-09-16 NOTE — ED Notes (Signed)
Pt returned from MRI at this time.  Pt is resting well.  Pt remains hypertensive with sys BP around 180.  Bedside monitors replaced.  Remains on heparin gtts. Will continue to monitor for any needs or changes.

## 2018-09-16 NOTE — Progress Notes (Signed)
MRI brain with no stroke. Increased signal in deep GM indicative of possible HTN urgency.  Recs: Manage BP with gradual lowering to normal. Management of toxic metabolic derangements per primary team EEG in the AM No need for stroke work up. 2D echo F/U infectious labs.  -- Amie Portland, MD [facility] Pager: [phone] If 7pm to 7am, please call on call as listed on AMION.

## 2018-09-17 DIAGNOSIS — E8889 Other specified metabolic disorders: Secondary | ICD-10-CM | POA: Diagnosis present

## 2018-09-17 DIAGNOSIS — R402340 Coma scale, best motor response, flexion withdrawal, unspecified time: Secondary | ICD-10-CM | POA: Diagnosis present

## 2018-09-17 DIAGNOSIS — I120 Hypertensive chronic kidney disease with stage 5 chronic kidney disease or end stage renal disease: Secondary | ICD-10-CM | POA: Diagnosis present

## 2018-09-17 DIAGNOSIS — N189 Chronic kidney disease, unspecified: Secondary | ICD-10-CM | POA: Diagnosis not present

## 2018-09-17 DIAGNOSIS — D696 Thrombocytopenia, unspecified: Secondary | ICD-10-CM | POA: Diagnosis present

## 2018-09-17 DIAGNOSIS — D631 Anemia in chronic kidney disease: Secondary | ICD-10-CM | POA: Diagnosis present

## 2018-09-17 DIAGNOSIS — K219 Gastro-esophageal reflux disease without esophagitis: Secondary | ICD-10-CM | POA: Diagnosis present

## 2018-09-17 DIAGNOSIS — F0390 Unspecified dementia without behavioral disturbance: Secondary | ICD-10-CM | POA: Diagnosis present

## 2018-09-17 DIAGNOSIS — F419 Anxiety disorder, unspecified: Secondary | ICD-10-CM | POA: Diagnosis present

## 2018-09-17 DIAGNOSIS — I160 Hypertensive urgency: Secondary | ICD-10-CM | POA: Diagnosis present

## 2018-09-17 DIAGNOSIS — I4581 Long QT syndrome: Secondary | ICD-10-CM | POA: Diagnosis present

## 2018-09-17 DIAGNOSIS — R402220 Coma scale, best verbal response, incomprehensible words, unspecified time: Secondary | ICD-10-CM | POA: Diagnosis present

## 2018-09-17 DIAGNOSIS — I161 Hypertensive emergency: Secondary | ICD-10-CM | POA: Diagnosis present

## 2018-09-17 DIAGNOSIS — I169 Hypertensive crisis, unspecified: Secondary | ICD-10-CM | POA: Diagnosis present

## 2018-09-17 DIAGNOSIS — I248 Other forms of acute ischemic heart disease: Secondary | ICD-10-CM | POA: Diagnosis present

## 2018-09-17 DIAGNOSIS — N2581 Secondary hyperparathyroidism of renal origin: Secondary | ICD-10-CM | POA: Diagnosis present

## 2018-09-17 DIAGNOSIS — R29810 Facial weakness: Secondary | ICD-10-CM | POA: Diagnosis present

## 2018-09-17 DIAGNOSIS — F329 Major depressive disorder, single episode, unspecified: Secondary | ICD-10-CM | POA: Diagnosis present

## 2018-09-17 DIAGNOSIS — I674 Hypertensive encephalopathy: Secondary | ICD-10-CM | POA: Diagnosis present

## 2018-09-17 DIAGNOSIS — I491 Atrial premature depolarization: Secondary | ICD-10-CM | POA: Diagnosis present

## 2018-09-17 DIAGNOSIS — R531 Weakness: Secondary | ICD-10-CM | POA: Diagnosis present

## 2018-09-17 DIAGNOSIS — R008 Other abnormalities of heart beat: Secondary | ICD-10-CM | POA: Diagnosis present

## 2018-09-17 DIAGNOSIS — I517 Cardiomegaly: Secondary | ICD-10-CM | POA: Diagnosis present

## 2018-09-17 DIAGNOSIS — R4701 Aphasia: Secondary | ICD-10-CM | POA: Diagnosis present

## 2018-09-17 DIAGNOSIS — G9340 Encephalopathy, unspecified: Secondary | ICD-10-CM | POA: Diagnosis not present

## 2018-09-17 DIAGNOSIS — N186 End stage renal disease: Secondary | ICD-10-CM | POA: Diagnosis present

## 2018-09-17 DIAGNOSIS — C9000 Multiple myeloma not having achieved remission: Secondary | ICD-10-CM | POA: Diagnosis present

## 2018-09-17 LAB — RENAL FUNCTION PANEL
ALBUMIN: 2.4 g/dL — AB (ref 3.5–5.0)
Anion gap: 13 (ref 5–15)
BUN: 11 mg/dL (ref 8–23)
CO2: 23 mmol/L (ref 22–32)
Calcium: 8.4 mg/dL — ABNORMAL LOW (ref 8.9–10.3)
Chloride: 96 mmol/L — ABNORMAL LOW (ref 98–111)
Creatinine, Ser: 3.74 mg/dL — ABNORMAL HIGH (ref 0.44–1.00)
GFR calc Af Amer: 13 mL/min — ABNORMAL LOW (ref >60)
GFR, EST NON AFRICAN AMERICAN: 11 mL/min — AB (ref >60)
Glucose, Bld: 81 mg/dL (ref 70–99)
Phosphorus: 2.1 mg/dL — ABNORMAL LOW (ref 2.5–4.6)
Potassium: 3.7 mmol/L (ref 3.5–5.1)
Sodium: 132 mmol/L — ABNORMAL LOW (ref 135–145)

## 2018-09-17 LAB — CBC
HCT: 33.2 % — ABNORMAL LOW (ref 36.0–46.0)
Hemoglobin: 9.9 g/dL — ABNORMAL LOW (ref 12.0–15.0)
MCH: 29.6 pg (ref 26.0–34.0)
MCHC: 29.8 g/dL — ABNORMAL LOW (ref 30.0–36.0)
MCV: 99.1 fL (ref 80.0–100.0)
Platelets: 108 10*3/uL — ABNORMAL LOW (ref 150–400)
RBC: 3.35 MIL/uL — ABNORMAL LOW (ref 3.87–5.11)
RDW: 20.1 % — ABNORMAL HIGH (ref 11.5–15.5)
WBC: 4.7 10*3/uL (ref 4.0–10.5)
nRBC: 0.8 % — ABNORMAL HIGH (ref 0.0–0.2)

## 2018-09-17 LAB — URINE CULTURE: Culture: <10000 — AB

## 2018-09-17 LAB — HEPARIN LEVEL (UNFRACTIONATED)
Heparin Unfractionated: 0.24 IU/mL — ABNORMAL LOW (ref 0.30–0.70)
Heparin Unfractionated: 0.29 IU/mL — ABNORMAL LOW (ref 0.30–0.70)
Heparin Unfractionated: 0.4 IU/mL (ref 0.30–0.70)

## 2018-09-17 LAB — MRSA PCR SCREENING: MRSA BY PCR: NEGATIVE

## 2018-09-17 MED ORDER — HEPARIN SODIUM (PORCINE) 1000 UNIT/ML IJ SOLN
INTRAMUSCULAR | Status: AC
  Administered 2018-09-17: 1000 [IU] via INTRAVENOUS_CENTRAL
  Filled 2018-09-17: qty 4

## 2018-09-17 MED ORDER — ONDANSETRON HCL 4 MG/2ML IJ SOLN
INTRAMUSCULAR | Status: AC
  Administered 2018-09-17: 4 mg via INTRAVENOUS
  Filled 2018-09-17: qty 2

## 2018-09-17 MED ORDER — CHLORHEXIDINE GLUCONATE CLOTH 2 % EX PADS: 6.0000 | MEDICATED_PAD | Freq: Every day | CUTANEOUS | Status: DC

## 2018-09-17 NOTE — Progress Notes (Signed)
ANTICOAGULATION CONSULT NOTE - Follow Up Consult  Pharmacy Consult for heparin Indication: r/o ACS  Labs: Recent Labs    09/16/18 0008 09/16/18 0010 09/16/18 0414 09/16/18 0956  09/16/18 1632 09/16/18 1914 09/17/18 0451 09/17/18 1348  HGB 9.5* 11.2*  --   --   --   --   --  9.9*  --   HCT 31.9* 33.0*  --   --   --   --   --  33.2*  --   PLT 118*  --   --   --   --   --   --  108*  --   APTT 35  --   --   --   --   --   --   --   --   LABPROT 14.6  --   --   --   --   --   --   --   --   INR 1.15  --   --   --   --   --   --   --   --   HEPARINUNFRC  --   --   --   --    < >  --  0.14* 0.24* 0.29*  CREATININE 6.60* 6.90* 6.72*  --   --   --   --  3.74*  --   TROPONINI  --   --  0.28* 0.22*  --  0.15*  --   --   --    < > = values in this interval not displayed.    Assessment: 77yo female presented to ED as code stroke, unclear stroke exam vs hypertensive urgency/emergency vs encephalopathy, now pt w/ EKG changes and elevated troponin, to begin heparin.  Heparin level remains slightly subtherapeutic at 0.29 s/p increase this AM to 1200 units/hr.  Anemia of ESRD stable, no bleeding reported.    Goal of Therapy:  Heparin level 0.3-0.7 units/ml   Plan:  Increase heparin gtt to 1350 units/hr F/u heparin level in 8 hours Daily heparin level, CBC, s/s bleeding  Bertis Ruddy, PharmD Clinical Pharmacist Please check AMION for all [place] numbers 09/17/2018 2:37 PM

## 2018-09-17 NOTE — Progress Notes (Signed)
ANTICOAGULATION CONSULT NOTE - Follow Up Consult  Pharmacy Consult for heparin Indication: r/o ACS  Labs: Recent Labs    09/16/18 0008 09/16/18 0010 09/16/18 0414 09/16/18 0956 09/16/18 1041 09/16/18 1632 09/16/18 1914 09/17/18 0451  HGB 9.5* 11.2*  --   --   --   --   --  9.9*  HCT 31.9* 33.0*  --   --   --   --   --  33.2*  PLT 118*  --   --   --   --   --   --  108*  APTT 35  --   --   --   --   --   --   --   LABPROT 14.6  --   --   --   --   --   --   --   INR 1.15  --   --   --   --   --   --   --   HEPARINUNFRC  --   --   --   --  <0.10*  --  0.14* 0.24*  CREATININE 6.60* 6.90* 6.72*  --   --   --   --  3.74*  TROPONINI  --   --  0.28* 0.22*  --  0.15*  --   --     Assessment: 76yo female remains subtherapeutic on heparin though getting closer to goal; no gtt issues or signs of bleeding per RN.  Goal of Therapy:  Heparin level 0.3-0.7 units/ml   Plan:  Will increase heparin gtt by 2-3 units/kg/hr to 1200 units/hr and check level in 8 hours.    Wynona Neat, PharmD, BCPS  09/17/2018,6:19 AM

## 2018-09-17 NOTE — Evaluation (Signed)
Occupational Therapy Evaluation Patient Details Name: [name] MRN: [id number] DOB: July 18, 1941 Today's Date: 09/17/2018    History of Present Illness 77 y.o. female with medical history significant for ESRD on HD, depression with anxiety, multiple myeloma on chemotherapy, and uncontrolled hypertension, now presenting to the emergency department for evaluation of altered mental status and weakness.   Clinical Impression   This 77 y/o female presents with the above. Pt resides at SNF at baseline, presents supine in bed start of session initially lethargic but able to awake and converse with therapist, appears to be fairly accurate historian regarding PLOF. Pt reports she is mostly w/c bound at baseline and receives assist from staff for transfers as well as for bathing/dressing ADLs. Reports she is able to feed herself. Pt with elevated BP today (152/106 during session) therefore limited eval to bed level. Pt presenting with generalized weakness in bil UE/LE, overall following simple commands this session given increased time and intermittent repetition/cues. She currently requires setup-minguard assist for simple grooming ADL, max-totalA for LB ADL. Will continue to follow while she remains in acute setting to further assess mobility and ADL status and for progression towards her PLOF. Recommend pt return to SNF with follow up therapy services as deemed appropriate. Will follow.     Follow Up Recommendations  SNF;Supervision/Assistance - 24 hour    Equipment Recommendations  None recommended by OT;Other (comment)(defer back to SNF for DME needs )           Precautions / Restrictions Precautions Precautions: Fall Restrictions Weight Bearing Restrictions: No      Mobility Bed Mobility               General bed mobility comments: not assessed during this session due to elevated BP, pt requiring totalA+2 during PT eval on 12/17  Transfers                 General  transfer comment: did not attempt    Balance                                           ADL either performed or assessed with clinical judgement   ADL Overall ADL's : Needs assistance/impaired Eating/Feeding: NPO Eating/Feeding Details (indicate cue type and reason): pt asking for water during session, spoke with RN and SLP after session  Grooming: Set up;Bed level   Upper Body Bathing: Minimal assistance;Bed level       Upper Body Dressing : Moderate assistance;Bed level                     General ADL Comments: pt with elevated BP this session (152/106) therefore limited eval to bed level, feel pt will at least be able to progress to EOB with external assist. Will continue to assess functional/ADL status. Pt mostly focused on wanting water this session, made RN and SLP aware as SLP preparing to enter room at end of OT session     Vision         Perception     Praxis      Pertinent Vitals/Pain Pain Assessment: No/denies pain     Hand Dominance Right   Extremity/Trunk Assessment Upper Extremity Assessment Upper Extremity Assessment: Generalized weakness(grossly 3/5 throughout)   Lower Extremity Assessment Lower Extremity Assessment: Defer to PT evaluation       Communication Communication Communication: Expressive difficulties;Receptive difficulties  Cognition Arousal/Alertness: Awake/alert(dozing off but easy to arouse)   Overall Cognitive Status: No family/caregiver present to determine baseline cognitive functioning                                 General Comments: pt oriented to self, month and year, able to verbalize she lives at [facility] but unaware she is in the hospital. Pt providing PLOF to therapist, appears to be accurate historian; overall following one step commands this session though intermittently requires repetition and multimodal cues to do so   General Comments  BP 152/106 with bed level activity, RN  aware     Exercises     Shoulder Instructions      Home Living Family/patient expects to be discharged to:: Skilled nursing facility   Available Help at Discharge: [facility] Type of Home: [place]                           Additional Comments: [facility]      Prior Functioning/Environment Level of Independence: Needs assistance  Gait / Transfers Assistance Needed: Uses w/c for mobility per pt and pt reports she has assist for transfers to/from w/c from nursing staff. Per chart review/PT eval daughter and pt report pt was Mod I for transfers but daughter reports falls. ADL's / Homemaking Assistance Needed: pt reports nursing staff assist with most ADLs, reports she was performing self-feeding without assist.             OT Problem List: Decreased strength;Decreased range of motion;Decreased activity tolerance;Impaired balance (sitting and/or standing);Decreased cognition      OT Treatment/Interventions: Self-care/ADL training;Therapeutic exercise;Therapeutic activities;Patient/family education;Balance training    OT Goals(Current goals can be found in the care plan section) Acute Rehab OT Goals Patient Stated Goal: she wants water OT Goal Formulation: With patient Time For Goal Achievement: 10/01/18 Potential to Achieve Goals: Good  OT Frequency: Min 2X/week   Barriers to D/C:            Co-evaluation              AM-PAC OT "6 Clicks" Daily Activity     Outcome Measure Help from another person eating meals?: Total(NPO) Help from another person taking care of personal grooming?: A Little Help from another person toileting, which includes using toliet, bedpan, or urinal?: Total Help from another person bathing (including washing, rinsing, drying)?: A Lot Help from another person to put on and taking off regular upper body clothing?: A Lot Help from another person to put on and taking off regular lower body clothing?:  Total 6 Click Score: 10   End of Session Nurse Communication: Mobility status  Activity Tolerance: Patient tolerated treatment well;Other (comment)(limited due to elevated BP) Patient left: in bed;with call bell/phone within reach;with bed alarm set  OT Visit Diagnosis: Muscle weakness (generalized) (M62.81)                Time: 1126-1140 OT Time Calculation (min): 14 min Charges:  OT General Charges $OT Visit: 1 Visit OT Evaluation $OT Eval Moderate Complexity: 1 Mod  Lou Cal, OT [employer] Pager [phone] Office [phone]   Raymondo Band 09/17/2018, 3:13 PM

## 2018-09-17 NOTE — Progress Notes (Signed)
HD tx initiated via HD cath w/o problem Bilat ports: pull/push/flush equally w/o problem VSS Will continue to monitor while on HD tx 

## 2018-09-17 NOTE — Progress Notes (Signed)
[facility] Progress Note  Subjective: seems more alert today, asking for water  Vitals:   09/17/18 0218 09/17/18 0600 09/17/18 0730 09/17/18 1245  BP: (!) 158/119 (!) 135/106 (!) 150/116 (!) 157/114  Pulse: 74  66   Resp: 18  19   Temp:   (!) 97.4 F (36.3 C)   TempSrc:   Axillary   SpO2: 99%  99%   Weight:      Height:        Inpatient medications: . amLODipine  10 mg Oral QPM  . calcium acetate  1,334 mg Oral TID WC  . Chlorhexidine Gluconate Cloth  6 each Topical Q0600  . isosorbide mononitrate  60 mg Oral Daily  . lamoTRIgine  50 mg Oral Daily  . latanoprost  1 drop Both Eyes QHS  . LORazepam  0.5 mg Oral QHS  . losartan  50 mg Oral QPM  . sevelamer carbonate  2,400 mg Oral TID WC  . sodium chloride flush  3 mL Intravenous Q12H  . sodium chloride flush  3 mL Intravenous Q12H  . valACYclovir  500 mg Oral Q48H   . sodium chloride    . sodium chloride    . sodium chloride    . heparin 1,200 Units/hr (09/17/18 0626)   sodium chloride, sodium chloride, sodium chloride, acetaminophen **OR** acetaminophen, alteplase, cloNIDine, heparin, hydrALAZINE, lidocaine (PF), lidocaine-prilocaine, pentafluoroprop-tetrafluoroeth, senna-docusate, sodium chloride flush  Iron/TIBC/Ferritin/ %Sat    Component Value Date/Time   IRON 149 (H) 03/13/2014 0442   TIBC NOT CALC 03/13/2014 0442   FERRITIN 2,786 (H) 03/13/2014 0442   IRONPCTSAT NOT CALC 03/13/2014 0442    Exam: General: WDWN elderly female NAD  Head: NCAT sclera not icteric MMM Neck: Supple. No JVD  Lungs: CTA bilaterally without wheezes, rales, or rhonchi. Breathing is unlabored. Heart: RRR 2/6 SEM  Abdomen: soft NT + BS Lower extremities:without edema or ischemic changes, no open wounds  Neuro: Does not follow commands. Does not move extremities.  Psych:  Nonverbal. Not responding to questions.  Dialysis Access: R  Chest [facility]. L AVF (clotted, needs new access)  Dialysis Orders: AF TTS 3.5h   425/1.5    58kg    2K/2.25Ca  TDC No heparin Hectorol 2 mcg IV TIW Retacrit 18000 U IV TIW Venofer 133m IV x 5   Assessment/Plan: 1. Acute encephalopathy/weakness -Neuro consulted. CT/MRI neg for acute CVA. No seizure on EEG.  Further w/u per primary. Hx recurrent bacteremia. Blood cultures pending. Dementia at baseline.  2. Elevated troponin. On heparin gtt, trending troponin - per primary  3. ESRD - Labs stable. TTS. Had HD here yesterday on schedule.  4. Hypertension/volume  - Hypertensive on admit. IV meds for now. May improve with UF on HD  5. Anemia  - Hgb 11.2. No ESA needs currently  6. Metabolic bone disease -  Continue VDRA/Renvela binder when eating  7. Nutrition - Add prostat for low albumin  8. Multiple myeloma. Chemo infusions Followed by oncology [facility].    RKelly SplinterMD [facility] pager [phone]  09/17/2018, 12:51 PM   Recent Labs  Lab 09/16/18 0008  09/16/18 0414 09/17/18 0451  NA 134*   < > 133* 132*  K 4.0   < > 5.0 3.7  CL 94*   < > 93* 96*  CO2 29  --  28 23  GLUCOSE 85   < > 81 81  BUN 25*   < > 28* 11  CREATININE 6.60*   < >  6.72* 3.74*  CALCIUM 9.9  --  9.5 8.4*  PHOS  --   --   --  2.1*  ALBUMIN 2.5*  --   --  2.4*  INR 1.15  --   --   --    < > = values in this interval not displayed.   Recent Labs  Lab 09/16/18 0008  AST 25  ALT 9  ALKPHOS 51  BILITOT 0.8  PROT 9.4*   Recent Labs  Lab 09/16/18 0008 09/16/18 0010 09/17/18 0451  WBC 6.5  --  4.7  NEUTROABS 4.9  --   --   HGB 9.5* 11.2* 9.9*  HCT 31.9* 33.0* 33.2*  MCV 99.4  --  99.1  PLT 118*  --  108*

## 2018-09-17 NOTE — Progress Notes (Signed)
ANTICOAGULATION CONSULT NOTE - Follow Up Consult  Pharmacy Consult for heparin Indication: r/o ACS  Labs: Recent Labs    09/16/18 0008 09/16/18 0010 09/16/18 0414 09/16/18 0956  09/16/18 1632  09/17/18 0451 09/17/18 1348 09/17/18 2256  HGB 9.5* 11.2*  --   --   --   --   --  9.9*  --   --   HCT 31.9* 33.0*  --   --   --   --   --  33.2*  --   --   PLT 118*  --   --   --   --   --   --  108*  --   --   APTT 35  --   --   --   --   --   --   --   --   --   LABPROT 14.6  --   --   --   --   --   --   --   --   --   INR 1.15  --   --   --   --   --   --   --   --   --   HEPARINUNFRC  --   --   --   --    < >  --    < > 0.24* 0.29* 0.40  CREATININE 6.60* 6.90* 6.72*  --   --   --   --  3.74*  --   --   TROPONINI  --   --  0.28* 0.22*  --  0.15*  --   --   --   --    < > = values in this interval not displayed.    Assessment/Plan:  77yo female therapeutic on heparin after multiple rate increases. Will continue gtt at current rate and confirm stable with am labs.   Wynona Neat, PharmD, BCPS  09/17/2018,11:37 PM

## 2018-09-17 NOTE — Evaluation (Signed)
Speech Language Pathology Evaluation Patient Details Name: [name] MRN: [id number] DOB: Feb 01, 1941 Today's Date: 09/17/2018 Time: 0160-1093 SLP Time Calculation (min) (ACUTE ONLY): 11 min  Problem List:  Patient Active Problem List   Diagnosis Date Noted  . Acute encephalopathy 09/16/2018  . NSTEMI (non-ST elevated myocardial infarction) ([place]) 09/16/2018  . Prolonged QT interval 09/16/2018  . DNR (do not resuscitate) discussion   . Palliative care by specialist   . End-stage renal disease on hemodialysis ([place])   . Hemodialysis catheter infection ([place])   . Anemia associated with chronic renal failure 08/26/2018  . Anemia 08/26/2018  . Hypothermia 08/26/2018  . Weakness generalized 08/25/2018  . Gram-negative bacteremia   . Acute lower UTI 08/12/2018  . Swelling of joint of left wrist 08/12/2018  . Rectal bleeding 08/12/2018  . Neurocognitive deficits 06/05/2018  . Altered mental status 05/28/2018  . Macrocytosis 05/01/2018  . Hypertension, uncontrolled 04/16/2018  . ESRD needing dialysis ([place]) 04/16/2018  . Encephalopathy acute 04/16/2018  . Anxiety and depression 04/16/2018  . Hypertension, accelerated 04/16/2018  . Pressure ulcer, stage I 04/16/2018  . Abnormal chest x-ray 12/26/2017  . HCAP (healthcare-associated pneumonia) 04/24/2017  . Sepsis ([place]) 04/24/2017  . Nausea and vomiting 04/24/2017  . Thrombocytopenia ([place]) 04/24/2017  . Hyperkalemia 01/16/2017  . IBS (irritable bowel syndrome) 12/27/2016  . Mood disorder ([place]) 07/19/2016  . Diastolic dysfunction 23/55/7322  . Insomnia 04/27/2016  . Hyponatremia 03/12/2016  . Diarrhea 03/12/2016  . Hypertensive urgency 02/18/2016  . Swelling of joint of left knee 02/18/2016  . Leukopenia due to antineoplastic chemotherapy ([place]) 09/03/2015  . Malnutrition of moderate degree 09/02/2015  . GERD (gastroesophageal reflux disease) 04/23/2015  . 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 Multiple myeloma ([place])  03/11/2014  . Hypertension associatd with end stage renal disease on dialysis 03/11/2014   Past Medical History:  Past Medical History:  Diagnosis Date  . Anemia    from dx list from [facility]  . Anxiety   . Closed fracture of right distal femur ([place]) 09/01/2015  . Depression   . ESRD (end stage renal disease) on dialysis [facility])    "TTS; [facility]" (04/24/2017)  . GERD (gastroesophageal reflux disease) 04/23/2015  . HCAP (healthcare-associated pneumonia) 04/24/2017  . Heart murmur   . History of blood transfusion    "low HgB when I was going to dialysis center"  . Hypertension   . Hypertension associated with end stage renal disease on dialysis 03/11/2014  . Malnutrition of moderate degree 09/02/2015  . Multiple myeloma ([place]) 03/11/2014  . Patient is Jehov[employer]'s Witness    "I'd rather never have any blood transfusions unless absolutely necessary; please check with me 1st". (04/24/2017)  . Pneumonia ~ 2016  . Transfusion history    08/25/2018 hemoglobin 7.1,+ FOB, status post 2 units packed cells   Past Surgical History:  Past Surgical History:  Procedure Laterality Date  . AV FISTULA PLACEMENT Left   . I&D EXTREMITY Left 02/27/2016   Procedure: ARTHROSCOPIC IRRIGATION AND DEBRIDEMENT EXTREMITY;  Surgeon: Renette Butters, MD;  Location: [facility];  Service: Orthopedics;  Laterality: Left;  . IR DIALY SHUNT INTRO NEEDLE/INTRACATH INITIAL W/IMG LEFT Left 08/29/2018  . IR FLUORO GUIDE CV LINE RIGHT  08/29/2018  . IR US GUIDE VASC ACCESS RIGHT  08/29/2018  . TEE WITHOUT CARDIOVERSION N/A 02/29/2016   Procedure: TRANSESOPHAGEAL ECHOCARDIOGRAM (TEE);  Surgeon: Thayer Headings, MD;  Location: [facility] ENDOSCOPY;  Service: Cardiovascular;  Laterality: N/A;   HPI:  77 year old female with  history of multiple myeloma currently being followed by oncology [facility], dementia, ESRD, who presented with altered mental status and weakness.  Patient was being worked up for possible CVA however CT and MRI showed no  acute CVA.  Patient also had an EEG which was unremarkable for seizure.  No source of infection as chest x-ray and UA are unremarkable.    Assessment / Plan / Recommendation Clinical Impression  Per chart review, pt's baseline cognitive status isn't fully descriptive or documented. Pt currently resides in SNF and this session she is able to maintain alertness, is oriented to self, location (hospital) and able to recall name of SNF. She is able to interact appropraitely and answer general questions. She is confused to current situation but able to express basic wants of thirst. SLP presented thin liquids as part of cognition evaluation. Pt consumed > 6 oz thin liquids consectutively without overt s/s of aspiration. Information given to MD and nursing. Given pt's history of dementia, would defere any cognitive services to her SNF to be addressed in familiar setting. ST to sign off at this time.     SLP Assessment  SLP Recommendation/Assessment: All further Speech Lanaguage Pathology  needs can be addressed in the next venue of care SLP Visit Diagnosis: Cognitive communication deficit (R41.841)    Follow Up Recommendations  Skilled Nursing facility    Frequency and Duration           SLP Evaluation Cognition  Overall Cognitive Status: Difficult to assess Arousal/Alertness: Awake/alert Orientation Level: Oriented to person;Oriented to place;Disoriented to time;Disoriented to situation Attention: Focused Focused Attention: Appears intact       Comprehension  Auditory Comprehension Overall Auditory Comprehension: (able to follow simple directions) Yes/No Questions: Within Functional Limits(as related to herself)    Expression Expression Primary Mode of Expression: Verbal Verbal Expression Overall Verbal Expression: Appears within functional limits for tasks assessed Written Expression Dominant Hand: Right Written Expression: Not tested   Oral / Motor  Oral Motor/Sensory  Function Overall Oral Motor/Sensory Function: Within functional limits Motor Speech Overall Motor Speech: Appears within functional limits for tasks assessed Respiration: Within functional limits Phonation: Normal Resonance: Within functional limits Articulation: Within functional limitis Intelligibility: Intelligible Motor Planning: Witnin functional limits Motor Speech Errors: Not applicable   GO                    Eulia Hatcher 09/17/2018, 1:02 PM

## 2018-09-17 NOTE — Progress Notes (Signed)
[facility]                                                                              Patient Demographics  [name], is a 77 y.o. female, DOB - 1940-11-09, HYI:[id number]  Admit date - 09/16/2018   Admitting Physician Vianne Bulls, MD  Outpatient Primary MD for the patient is Hendricks Limes, MD  Outpatient specialists:   LOS - 0  days   Medical records reviewed and are as summarized below:    Chief Complaint  Patient presents with  . Code Stroke       Brief summary   77 year old female with history of multiple myeloma currently being followed by oncology [facility], dementia, ESRD, who presented with altered mental status and weakness.  Patient was being worked up for possible CVA however CT and MRI showed no acute CVA.  Patient also had an EEG which was unremarkable for seizure.  No source of infection as chest x-ray and UA are unremarkable.  She is found to have accelerated hypertension, likely rebound from not getting her home medications.  Discussed with [facility] oncology as MRI showed multiple osseous metastasis/multiple myeloma.  They recommended obtaining multiple myeloma work-up- results can be discussed with Ms. Carrie Rasmussen, NP or Dr. Amalia Hailey, [phone].  Nephrology and neurology consulted for dialysis.   Assessment & Plan    Principal Problem:   Acute metabolic encephalopathy, ?  Hypertensive encephalopathy in the setting of dementia -Unclear etiology, slightly improving, per notes was nonverbal and did not follow commands on 12/17 -Was able to give 1 word answers today -Patient had presented with accelerated hypertension.  BP 200/120 on admission.  Chest x-ray showed minimal vascular congestion, CT head was negative, sodium 134, albumin 2.5, neurology was consulted. -MRI of the brain showed no stroke, increased signal in deep GM indicative of possible hypertensive urgency -EEG showed mild to moderate generalized nonspecific cerebral  dysfunction, no seizures -2D echo 11/27 had shown EF of 55 to 60% with grade 1 diastolic dysfunction -Per neurology no need for further stroke work-up -Dr. Ree Kida discussed with [facility] oncology, MRI head showed multiple osseous metastasis/multiple myeloma, recommended obtaining multiple myeloma work-up -Has a history of recurrent bacteremia, blood cultures pending -UA negative for UTI  Active Problems:   Multiple myeloma ([place]) -Followed by oncology [facility], receives chemo infusions. -If patient has another episode of bacteremia, will need to hold chemotherapy.    Hypertensive urgency -Improving with HD, continue Imdur, hydralazine, clonidine, amlodipine, losartan  Elevated troponin Likely due to demand ischemia and hypertensive emergency, continue heparin drip 2D echo 11/27 had shown EF of 55 to 60% with grade 1 diastolic dysfunction  ESRD on HD, TTS Nephrology consulted, receiving HD per schedule  Code Status: Full CODE STATUS DVT Prophylaxis: Heparin Family Communication: Discussed in detail with the patient, all imaging results, lab results explained to the patient   Disposition Plan:   Time Spent in minutes  Procedures:  Hemodialysis  Consultants:   Nephrology Neurology  Antimicrobials:      Medications  Scheduled Meds: . amLODipine  10 mg Oral QPM  . calcium acetate  1,334 mg Oral TID  WC  . Chlorhexidine Gluconate Cloth  6 each Topical Q0600  . isosorbide mononitrate  60 mg Oral Daily  . lamoTRIgine  50 mg Oral Daily  . latanoprost  1 drop Both Eyes QHS  . LORazepam  0.5 mg Oral QHS  . losartan  50 mg Oral QPM  . sevelamer carbonate  2,400 mg Oral TID WC  . sodium chloride flush  3 mL Intravenous Q12H  . sodium chloride flush  3 mL Intravenous Q12H  . valACYclovir  500 mg Oral Q48H   Continuous Infusions: . sodium chloride    . sodium chloride    . sodium chloride    . heparin 1,200 Units/hr (09/17/18 0626)   PRN Meds:.sodium chloride, sodium chloride,  sodium chloride, acetaminophen **OR** acetaminophen, alteplase, cloNIDine, heparin, hydrALAZINE, lidocaine (PF), lidocaine-prilocaine, pentafluoroprop-tetrafluoroeth, senna-docusate, sodium chloride flush   Antibiotics   Anti-infectives (From admission, onward)   Start     Dose/Rate Route Frequency Ordered Stop   09/16/18 1000  valACYclovir (VALTREX) tablet 500 mg     500 mg Oral Every 48 hours 09/16/18 0400          Subjective:   [name] was seen and examined today.  Alert and awake with one-word answers, no fevers or chills, no acute issues. Patient denies dizziness, chest pain, shortness of breath, abdominal pain, N/V/D/C.  No acute issues overnight.   Objective:   Vitals:   09/17/18 0218 09/17/18 0600 09/17/18 0730 09/17/18 1245  BP: (!) 158/119 (!) 135/106 (!) 150/116 (!) 157/114  Pulse: 74  66   Resp: 18  19   Temp:   (!) 97.4 F (36.3 C)   TempSrc:   Axillary   SpO2: 99%  99%   Weight:      Height:        Intake/Output Summary (Last 24 hours) at 09/17/2018 1518 Last data filed at 09/17/2018 1417 Gross per 24 hour  Intake 280 ml  Output 2513 ml  Net -2233 ml     Wt Readings from Last 3 Encounters:  09/17/18 57.5 kg  09/04/18 57.6 kg  09/03/18 57.6 kg     Exam  General: Alert and oriented x self  Eyes:   HEENT:    Cardiovascular: S1 S2 auscultated, . Regular rate and rhythm.  Respiratory: Clear to auscultation bilaterally, no wheezing, rales or rhonchi  Gastrointestinal: Soft, nontender, nondistended, + bowel sounds  Ext: no pedal edema bilaterally  Neuro: Ingal 4 extremities,  Musculoskeletal: No digital cyanosis, clubbing  Skin: No rashes  Psych: Still somewhat confused   Data Reviewed:  I have personally reviewed following labs and imaging studies  Micro Results Recent Results (from the past 240 hour(s))  Urine culture     Status: Abnormal   Collection Time: 09/16/18  3:11 AM  Result Value Ref Range Status   Specimen  Description URINE, CATHETERIZED  Final   Special Requests NONE  Final   Culture (A)  Final    <10,000 COLONIES/mL INSIGNIFICANT GROWTH Performed at [facility], [address], [place], [place] [address]    Report Status 09/17/2018 FINAL  Final  Culture, blood (Routine X 2) w Reflex to ID Panel     Status: None (Preliminary result)   Collection Time: 09/16/18  3:12 AM  Result Value Ref Range Status   Specimen Description BLOOD RIGHT ANTECUBITAL  Final   Special Requests   Final    BOTTLES DRAWN AEROBIC AND ANAEROBIC Blood Culture adequate volume   Culture  Final    NO GROWTH 1 DAY Performed at [facility], [place] [address] [place], [place] [address]    Report Status PENDING  Incomplete  Culture, blood (Routine X 2) w Reflex to ID Panel     Status: None (Preliminary result)   Collection Time: 09/16/18  3:15 AM  Result Value Ref Range Status   Specimen Description BLOOD RIGHT HAND  Final   Special Requests   Final    BOTTLES DRAWN AEROBIC ONLY Blood Culture results may not be optimal due to an inadequate volume of blood received in culture bottles   Culture   Final    NO GROWTH 1 DAY Performed at [facility], [place] [address] [place], [place] [address]    Report Status PENDING  Incomplete  MRSA PCR Screening     Status: None   Collection Time: 09/16/18  8:10 PM  Result Value Ref Range Status   MRSA by PCR NEGATIVE NEGATIVE Final    Comment:        The GeneXpert MRSA Assay (FDA approved for NASAL specimens only), is one component of a comprehensive MRSA colonization surveillance program. It is not intended to diagnose MRSA infection nor to guide or monitor treatment for MRSA infections. Performed at [facility], [place] [address] [place], [place] [address]     Radiology Reports Dg Chest 2 View  Result Date: 08/25/2018 CLINICAL DATA:  Altered mental status EXAM: CHEST - 2 VIEW COMPARISON:  Chest x-rays dated 08/12/2018 and 06/26/2017 FINDINGS:  Stable cardiomegaly. Lungs are clear. No pleural effusion or pneumothorax seen. Osseous structures about the chest are unremarkable. IMPRESSION: 1. No active cardiopulmonary disease. No evidence of pneumonia or pulmonary edema. 2. Stable cardiomegaly. Electronically Signed   By: Franki Cabot M.D.   On: 08/25/2018 17:23   Ct Head Wo Contrast  Result Date: 08/25/2018 CLINICAL DATA:  New onset weakness after dialysis. Hypotension. Slurred speech. EXAM: CT HEAD WITHOUT CONTRAST TECHNIQUE: Contiguous axial images were obtained from the base of the skull through the vertex without intravenous contrast. COMPARISON:  CT head without contrast 07/26/2018 FINDINGS: Brain: Moderate atrophy and white matter is similar to the prior exam. No acute infarct, hemorrhage, or mass lesion is present. Diffuse hypoattenuation is present throughout the basal ganglia. A remote lacunar infarct is again seen in the left cerebellum. Ventricles are proportionate to the degree of atrophy. No significant extra-axial fluid collection present. Vascular: Atherosclerotic calcifications are present within the cavernous internal carotid arteries bilaterally. There is no hyperdense vessel. Skull: Multiple lucent lesions are again noted. There is no significant interval change. Findings are consistent with known multiple myeloma. There is a focal lesion involving the left arch of C1. Sinuses/Orbits: The paranasal sinuses and mastoid air cells are clear. Globes and orbits are within normal limits. IMPRESSION: 1. Stable appearance of atrophy and white matter disease. 2. No acute intracranial abnormality. 3. Multiple lucent lesions in the calvarium and left-sided arch of C1 consistent with known multiple myeloma. There is no significant progression. Electronically Signed   By: San Morelle M.D.   On: 08/25/2018 17:22   Mr Brain Wo Contrast  Result Date: 09/16/2018 CLINICAL DATA:  Altered mental status and weakness. History of end-stage  renal disease on dialysis, multiple myeloma, hypertension. EXAM: MRI HEAD WITHOUT CONTRAST TECHNIQUE: Multiplanar, multiecho pulse sequences of the brain and surrounding structures were obtained without intravenous contrast. COMPARISON:  CT HEAD September 16, 2018 and MRI head April 16, 2018 FINDINGS: INTRACRANIAL  CONTENTS: No reduced diffusion to suggest acute ischemia. Numerous infratentorial to lesser extent supratentorial chronic microhemorrhages. Old small LEFT cerebellar infarct. Hazy T2 hyperintense signal bilateral basal ganglia a increased from prior MRI. 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 Patchy supratentorial white matter FLAIR T2 hyperintensities. The ventricles and sulci are normal for patient's age. No suspicious parenchymal signal, masses, mass effect. No abnormal extra-axial fluid collections. No extra-axial masses. VASCULAR: Normal major intracranial vascular flow voids present at skull base. SKULL AND UPPER CERVICAL SPINE: No abnormal sellar expansion. Mildly reduced diffusion with low ADC values of numerous calvarial metastasis corresponding to known multiple myeloma. Heterogeneous calvarial and cervical spine bone marrow signal consistent with myeloproliferative disease. Craniocervical junction maintained. SINUSES/ORBITS: The mastoid air-cells and included paranasal sinuses are well-aerated.The included ocular globes and orbital contents are non-suspicious. OTHER: Patient is edentulous. IMPRESSION: 1. No acute infarct. 2. Increased basal ganglia and thalami edema seen with hypertensive encephalopathy, less likely rapidly progressing small vessel ischemic changes. 3. Moderate chronic small vessel ischemic changes. 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 4. Multiple osseous metastasis/multiple myeloma. Electronically Signed   By: Elon Alas M.D.   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 Daryll Brod, MD Guidance:  Ultrasound and fluoroscopic FLUOROSCOPY TIME:  Fluoroscopy Time: 1 minutes 0 seconds (2 mGy). MEDICATIONS: Ancef 2 g administered within 1 hour of the procedure ANESTHESIA/SEDATION: Versed 0.5 mg IV; Fentanyl 25 mcg IV; Moderate Sedation Time:  17 minutes The patient was continuously monitored during the procedure by the interventional radiology nurse under my direct supervision. CONTRAST:  None. COMPLICATIONS: None immediate. PROCEDURE: Informed consent was obtained from the patient following explanation of the procedure, risks, benefits and alternatives. The patient understands, agrees and consents for the procedure. All questions were addressed. A time out was performed. Maximal barrier sterile technique utilized including caps, mask, sterile gowns, sterile gloves, large sterile drape, hand hygiene, and 2% chlorhexidine scrub. Under sterile conditions and local anesthesia, right internal jugular micropuncture venous access was performed with ultrasound. Images were obtained for documentation. A guide wire was inserted followed by a transitional dilator. Next, a 0.035 guidewire was advanced into the IVC with a 5-French catheter. Measurements were obtained from the right venotomy site to the proximal right atrium. In the right infraclavicular chest, a subcutaneous tunnel was created under sterile conditions and local anesthesia. 1% lidocaine with epinephrine was utilized for this. The 19 cm tip to cuff palindrome catheter was tunneled subcutaneously to the venotomy site and inserted into the SVC/RA junction through a valved peel-away sheath. Position was confirmed with fluoroscopy. Images were obtained for documentation. Blood was  aspirated from the catheter followed by saline and heparin flushes. The appropriate volume and strength of heparin was instilled in each lumen. Caps were applied. The catheter was secured at the tunnel site with Gelfoam and a pursestring suture. The venotomy site was closed with subcuticular Vicryl suture. Dermabond was applied to the small right neck incision. A dry sterile dressing was applied. The catheter is ready for use. No immediate complications. IMPRESSION: Ultrasound and fluoroscopically guided right internal jugular tunneled hemodialysis catheter (19 cm tip to cuff palindrome catheter). Electronically Signed   By: Jerilynn Mages.  Shick M.D.   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 Daryll Brod, MD Guidance:  Ultrasound and fluoroscopic FLUOROSCOPY TIME:  Fluoroscopy Time: 1 minutes 0 seconds (2 mGy). MEDICATIONS: Ancef 2 g administered within 1 hour of the procedure ANESTHESIA/SEDATION: Versed 0.5 mg IV; Fentanyl 25 mcg IV; Moderate Sedation Time:  17 minutes The patient was continuously monitored during the procedure by the interventional radiology nurse under my direct supervision. CONTRAST:  None. COMPLICATIONS: None immediate. PROCEDURE: Informed consent was obtained from the patient following explanation of the procedure, risks, benefits and alternatives. The patient understands, agrees and consents for the procedure. All questions were addressed. A time out was performed. Maximal barrier sterile technique utilized including caps, mask, sterile gowns, sterile gloves, large sterile drape, hand hygiene, and 2% chlorhexidine scrub. Under sterile conditions and local anesthesia, right internal  jugular micropuncture venous access was performed with ultrasound. Images were obtained for documentation. A guide wire was inserted followed by a transitional dilator. Next, a 0.035 guidewire was advanced into the IVC with a 5-French catheter. Measurements were obtained from the right venotomy site to the proximal right atrium. In the right infraclavicular chest, a subcutaneous tunnel was created under sterile conditions and local anesthesia. 1% lidocaine with epinephrine was utilized for this. The 19 cm tip to cuff palindrome catheter was tunneled subcutaneously to the venotomy site and inserted into the SVC/RA junction through a valved peel-away sheath. Position was confirmed with fluoroscopy. Images were obtained for documentation. Blood was aspirated from the catheter followed by saline and heparin flushes. The appropriate volume and strength of heparin was instilled in each lumen. Caps were applied. The catheter was secured at the tunnel site with Gelfoam and a pursestring suture. The venotomy site was closed with subcuticular Vicryl suture. Dermabond was applied to the small right neck incision. A dry sterile dressing was applied. The catheter is ready for use. No immediate complications. IMPRESSION: Ultrasound and fluoroscopically guided right internal jugular tunneled hemodialysis catheter (19 cm tip to cuff palindrome catheter). Electronically Signed   By: Jerilynn Mages.  Shick M.D.   On: 08/29/2018 09:47   Dg Chest Port 1 View  Result Date: 09/16/2018 CLINICAL DATA:  Altered mental status. EXAM: PORTABLE CHEST 1 VIEW COMPARISON:  Radiographs 08/25/2018 FINDINGS: Right internal jugular dialysis catheter tip in the distal SVC. Unchanged cardiomegaly. Unchanged aortic tortuosity. Minimal vascular congestion without pulmonary edema. No focal airspace disease, large pleural effusion or pneumothorax. No acute osseous abnormalities are seen. IMPRESSION: Stable cardiomegaly. Minimal vascular congestion. Electronically  Signed   By: Keith Rake M.D.   On: 09/16/2018 01:57   Dg Colon W/cm - Wo/w Kub  Result Date: 09/01/2018 CLINICAL DATA:  GI bleed, anemia EXAM: BE WITH CONTRAST - WITHOUT AND WITH KUB CONTRAST:  Barium FLUOROSCOPY TIME:  Fluoroscopy Time:  2 minutes, 18 seconds Radiation Exposure Index (if provided by the fluoroscopic device): 32.9 mGy Number of Acquired Spot Images: 3 COMPARISON:  None. FINDINGS: The initial KUB demonstrates adequate prep, without a significant amount of stool in the colon. Vascular calcifications noted. Probable scarring the lung bases. This patient was confused and has limited mobility. We were able to turn her prone and in the left lateral decubitus and right lateral decubitus positions. Turning her supine was not realistic. Some of our image sequences  have the patient's arm across her belly, be reminded the patient multiple times to please move her arm by her sides. We were not able to turn up the patient into a full lateral projection. I used balloon compression in order to flatten out portions of the colon for better sensitivity. However, given the patient's difficulty in cooperating or turning, today's exam must be understood to have a reduced diagnostic sensitivity and specificity compared to a normal exam in a patient able to cooperate The barium column was advanced to tell barium into the terminal ileum. Upon compression fluoroscopy, scattered diverticula of the colon were observed without a concentrated appearance of diverticulosis. Patient was turned into decubitus positions in order to better characterize the bowel. There is some redundancy of the colon in the vicinity of the splenic flexure making it difficult to separate the loops of bowel. We were able to reflux the terminal ileum, which appears unremarkable. No appreciable colon mass is identified. No visualized filling defect in the barium column of the colon to favor a significant polyp. A post evacuation image of the  abdomen demonstrates no appreciable filling defect, and shows filling of the colon. IMPRESSION: 1. No mass or significant polyp is identified. Please note that the patient had some confusion and limited mobility which does mildly reduced diagnostic sensitivity and specificity. 2. Scattered colonic diverticula. Electronically Signed   By: Van Clines M.D.   On: 09/01/2018 16:29   Ir Dialy Shunt Intro Needle/intracath Initial W/img Left  Result Date: 08/29/2018 INDICATION: End-stage renal disease, occluded left upper arm AV fistula EXAM: Left AV fistulogram (limited) MEDICATIONS: None. ANESTHESIA/SEDATION: Moderate Sedation Time:  None. The patient was continuously monitored during the procedure by the interventional radiology nurse under my direct supervision. FLUOROSCOPY TIME:  Fluoroscopy Time: 1 minutes 0 seconds (2 mGy). COMPLICATIONS: None immediate. PROCEDURE: Informed written consent was obtained from the patient after a thorough discussion of the procedural risks, benefits and alternatives. All questions were addressed. Maximal Sterile Barrier Technique was utilized including caps, mask, sterile gowns, sterile gloves, sterile drape, hand hygiene and skin antiseptic. A timeout was performed prior to the initiation of the procedure. Under sterile conditions, Angiocath access performed of the left upper arm AV fistula. Limited contrast injection for fistulagram performed. Left upper arm fistulagram: This demonstrates near complete thrombotic occlusion of the left upper arm brachiocephalic fistula. 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 By ultrasound, there is a large amount of thrombus burden throughout the entire left upper arm aneurysmal fistula extending to the axilla. Therefore, any aggressive intervention would be low yield for long term durability and also high risk for pulmonary embolism. IMPRESSION: Thrombosed left upper arm AV fistula  which is very all, aneurysmal, tortuous. Large degree of thrombus burden throughout the fistula. See above comment. ACCESS: Because of the above findings, a tunneled right IJ dialysis catheter will be inserted. These results were called by telephone at the time of interpretation on 08/29/2018 at 9:51 am to Dr. Roney Jaffe , who verbally acknowledged these results. Electronically Signed   By: Jerilynn Mages.  Shick M.D.   On: 08/29/2018 09:51   Ct Head Code Stroke Wo Contrast  Result Date: 09/16/2018 CLINICAL DATA:  Code stroke. RIGHT facial droop. Aphasic. History of multiple myeloma, hypertension. EXAM: CT HEAD WITHOUT CONTRAST TECHNIQUE: Contiguous axial images were obtained from the base of the skull through the vertex without intravenous contrast. COMPARISON:  CT HEAD August 25, 2018 and  MRI head April 16, 2016 FINDINGS: Mild motion degraded examination. BRAIN: No intraparenchymal hemorrhage, mass effect nor midline shift. The ventricles and sulci are normal for age. 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 Old small LEFT cerebellar infarct. Patchy supratentorial white matter hypodensities. No acute large vascular territory infarcts. No abnormal extra-axial fluid collections. Basal cisterns are patent. VASCULAR: Mild calcific atherosclerosis of the carotid siphons. SKULL: No skull fracture. Multiple soft tissue masses within the calvarium including LEFT sphenoid eroding the inner and outer tables. No significant scalp soft tissue swelling. SINUSES/ORBITS: Mild paranasal sinus mucosal thickening. Mastoid air cells are well aerated. Soft tissue within the external auditory canals most compatible with cerumen.The included ocular globes and orbital contents are non-suspicious. OTHER: None. ASPECTS [facility] Stroke Program Early CT Score) - Ganglionic level infarction (caudate, lentiform nuclei, internal capsule, insula, M1-M3 cortex): 7 - Supraganglionic infarction (M4-M6 cortex): 3 Total score (0-10  with 10 being normal): 10 IMPRESSION: 1. Mild motion degraded examination.  No acute intracranial process. 2. ASPECTS is 10. 3. Mild chronic small vessel ischemic changes. 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 4. Multiple osseous lesions consistent with known multiple myeloma. 5. Critical Value/emergent results text paged to [place] via [employer] secure system on 09/16/2018 at 12:18 am, including interpreting physician's phone number. Electronically Signed   By: Elon Alas M.D.   On: 09/16/2018 00:18    Lab Data:  CBC: Recent Labs  Lab 09/16/18 0008 09/16/18 0010 09/17/18 0451  WBC 6.5  --  4.7  NEUTROABS 4.9  --   --   HGB 9.5* 11.2* 9.9*  HCT 31.9* 33.0* 33.2*  MCV 99.4  --  99.1  PLT 118*  --  947*   Basic Metabolic Panel: Recent Labs  Lab 09/16/18 0008 09/16/18 0010 09/16/18 0028 09/16/18 0414 09/17/18 0451  NA 134* 135  --  133* 132*  K 4.0 3.9  --  5.0 3.7  CL 94* 97*  --  93* 96*  CO2 29  --   --  28 23  GLUCOSE 85 84  --  81 81  BUN 25* 28*  --  28* 11  CREATININE 6.60* 6.90*  --  6.72* 3.74*  CALCIUM 9.9  --   --  9.5 8.4*  MG  --   --  2.8*  --   --   PHOS  --   --   --   --  2.1*   GFR: Estimated Creatinine Clearance: 10.1 mL/min (A) (by C-G formula based on SCr of 3.74 mg/dL (H)). Liver Function Tests: Recent Labs  Lab 09/16/18 0008 09/17/18 0451  AST 25  --   ALT 9  --   ALKPHOS 51  --   BILITOT 0.8  --   PROT 9.4*  --   ALBUMIN 2.5* 2.4*   No results for input(s): LIPASE, AMYLASE in the last 168 hours. No results for input(s): AMMONIA in the last 168 hours. Coagulation Profile: Recent Labs  Lab 09/16/18 0008  INR 1.15   Cardiac Enzymes: Recent Labs  Lab 09/16/18 0414 09/16/18 0956 09/16/18 1632  TROPONINI 0.28* 0.22* 0.15*   BNP (last 3 results) No results for input(s): PROBNP in the last 8760 hours. HbA1C: Recent Labs    09/16/18 0008  HGBA1C 4.6*   CBG: Recent Labs  Lab 09/16/18 0025  09/16/18 0758 09/16/18 0824 09/16/18 1131  GLUCAP 79 69* 165* 93   Lipid Profile: Recent Labs    09/16/18 0414  CHOL  135  HDL 60  LDLCALC 57  TRIG 90  CHOLHDL 2.3   Thyroid Function Tests: Recent Labs    09/16/18 0525  TSH 2.491   Anemia Panel: No results for input(s): VITAMINB12, FOLATE, FERRITIN, TIBC, IRON, RETICCTPCT in the last 72 hours. Urine analysis:    Component Value Date/Time   COLORURINE STRAW (A) 09/16/2018 0326   APPEARANCEUR CLEAR 09/16/2018 0326   LABSPEC 1.010 09/16/2018 0326   PHURINE 9.0 (H) 09/16/2018 0326   GLUCOSEU 50 (A) 09/16/2018 0326   HGBUR NEGATIVE 09/16/2018 0326   BILIRUBINUR NEGATIVE 09/16/2018 0326   KETONESUR 5 (A) 09/16/2018 0326   PROTEINUR 100 (A) 09/16/2018 0326   UROBILINOGEN 0.2 04/10/2015 1800   NITRITE NEGATIVE 09/16/2018 0326   LEUKOCYTESUR NEGATIVE 09/16/2018 0326     Ripudeep Rai M.D. [facility] 09/17/2018, 3:18 PM  Pager: [phone] Between 7am to 7pm - call Pager - [phone]  After 7pm go to [email or web address] - password [email or web address]  Call night coverage person covering after 7pm

## 2018-09-17 NOTE — Progress Notes (Signed)
HD tx completed @ 0135 w/o problem UF goal met Blood rinsed back VSS Report called to [place], Therapist, sports

## 2018-09-18 LAB — CBC
HCT: 27.2 % — ABNORMAL LOW (ref 36.0–46.0)
Hemoglobin: 8.3 g/dL — ABNORMAL LOW (ref 12.0–15.0)
MCH: 30.4 pg (ref 26.0–34.0)
MCHC: 30.5 g/dL (ref 30.0–36.0)
MCV: 99.6 fL (ref 80.0–100.0)
Platelets: 128 10*3/uL — ABNORMAL LOW (ref 150–400)
RBC: 2.73 MIL/uL — ABNORMAL LOW (ref 3.87–5.11)
RDW: 20.5 % — AB (ref 11.5–15.5)
WBC: 4.1 10*3/uL (ref 4.0–10.5)
nRBC: 0 % (ref 0.0–0.2)

## 2018-09-18 LAB — GLUCOSE, CAPILLARY: Glucose-Capillary: 81 mg/dL (ref 70–99)

## 2018-09-18 LAB — HEPATITIS B SURFACE ANTIGEN: Hepatitis B Surface Ag: NEGATIVE

## 2018-09-18 LAB — RENAL FUNCTION PANEL
Albumin: 2.1 g/dL — ABNORMAL LOW (ref 3.5–5.0)
Anion gap: 8 (ref 5–15)
BUN: 22 mg/dL (ref 8–23)
CO2: 27 mmol/L (ref 22–32)
Calcium: 8.9 mg/dL (ref 8.9–10.3)
Chloride: 93 mmol/L — ABNORMAL LOW (ref 98–111)
Creatinine, Ser: 5.33 mg/dL — ABNORMAL HIGH (ref 0.44–1.00)
GFR calc Af Amer: 8 mL/min — ABNORMAL LOW (ref >60)
GFR calc non Af Amer: 7 mL/min — ABNORMAL LOW (ref >60)
Glucose, Bld: 79 mg/dL (ref 70–99)
Phosphorus: 2.6 mg/dL (ref 2.5–4.6)
Potassium: 3.6 mmol/L (ref 3.5–5.1)
Sodium: 128 mmol/L — ABNORMAL LOW (ref 135–145)

## 2018-09-18 LAB — HEPARIN LEVEL (UNFRACTIONATED): Heparin Unfractionated: 0.42 IU/mL (ref 0.30–0.70)

## 2018-09-18 MED ORDER — HEPARIN SODIUM (PORCINE) 1000 UNIT/ML IJ SOLN
INTRAMUSCULAR | Status: AC
  Administered 2018-09-18: 11:00:00
  Filled 2018-09-18: qty 4

## 2018-09-18 MED ORDER — HEPARIN SODIUM (PORCINE) 1000 UNIT/ML IJ SOLN
3.2000 mL | Freq: Once | INTRAMUSCULAR | Status: AC
  Administered 2018-09-18: 3200 [IU] via INTRAVENOUS

## 2018-09-18 NOTE — Progress Notes (Signed)
ANTICOAGULATION CONSULT NOTE  Pharmacy Consult for heparin Indication: chest pain/ACS    Patient Measurements: Height: 5\' 3"  (160 cm) Weight: 124 lb 12.5 oz (56.6 kg) IBW/kg (Calculated) : 52.4  Hep Wt: 57.6 kg  Vital Signs: Temp: 98.5 F (36.9 C) (12/19 0735) Temp Source: Axillary (12/19 0735) BP: 158/102 (12/19 0754) Pulse Rate: 68 (12/19 0754)  Labs: Recent Labs    09/16/18 0008 09/16/18 0010 09/16/18 0414 09/16/18 0956  09/16/18 1632  09/17/18 0451 09/17/18 1348 09/17/18 2256 09/18/18 0513 09/18/18 0645  HGB 9.5* 11.2*  --   --   --   --   --  9.9*  --   --  8.3*  --   HCT 31.9* 33.0*  --   --   --   --   --  33.2*  --   --  27.2*  --   PLT 118*  --   --   --   --   --   --  108*  --   --  128*  --   APTT 35  --   --   --   --   --   --   --   --   --   --   --   LABPROT 14.6  --   --   --   --   --   --   --   --   --   --   --   INR 1.15  --   --   --   --   --   --   --   --   --   --   --   HEPARINUNFRC  --   --   --   --    < >  --    < > 0.24* 0.29* 0.40 0.42  --   CREATININE 6.60* 6.90* 6.72*  --   --   --   --  3.74*  --   --   --  5.33*  TROPONINI  --   --  0.28* 0.22*  --  0.15*  --   --   --   --   --   --    < > = values in this interval not displayed.    Estimated Creatinine Clearance: 7.1 mL/min (A) (by C-G formula based on SCr of 5.33 mg/dL (H)).   Assessment: 77yo female presented to ED as code stroke, unclear stroke exam vs hypertensive urgency/emergency vs encephalopathy, now pt w/ EKG changes and elevated troponin, to begin heparin. 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  Heparin level this morning remains therapeutic (HL 0.42 << 0.4, goal of 0.3-0.7). Hgb/Hct slight drop, plts wnl - no bleeding or issues noted per RN report.   Goal of Therapy:  Heparin level 0.3-0.7 units/ml Monitor platelets by anticoagulation protocol: Yes   Plan:  - Continue Heparin at 1350 units/hr (13.5 ml/hr) - Will continue to monitor for any  signs/symptoms of bleeding and will follow up with heparin level in the a.m.   Thank you for allowing pharmacy to be a part of this patient's care.  Alycia Rossetti, PharmD, BCPS Clinical Pharmacist Pager: [phone] Clinical phone for 09/18/2018 from 7a-3:30p: [phone] If after 3:30p, please call main pharmacy at: [phone] Please check AMION for all [place] numbers 09/18/2018 8:29 AM

## 2018-09-18 NOTE — Progress Notes (Signed)
[facility]                                                                              Patient Demographics  [name], is a 77 y.o. female, DOB - 1941/01/01, XBM:[id number]  Admit date - 09/16/2018   Admitting Physician Vianne Bulls, MD  Outpatient Primary MD for the patient is Hendricks Limes, MD  Outpatient specialists:   LOS - 1  days   Medical records reviewed and are as summarized below:    Chief Complaint  Patient presents with  . Code Stroke       Brief summary   77 year old female with history of multiple myeloma currently being followed by oncology [facility], dementia, ESRD, who presented with altered mental status and weakness.  Patient was being worked up for possible CVA however CT and MRI showed no acute CVA.  Patient also had an EEG which was unremarkable for seizure.  No source of infection as chest x-ray and UA are unremarkable.  She is found to have accelerated hypertension, likely rebound from not getting her home medications.  Discussed with [facility] oncology as MRI showed multiple osseous metastasis/multiple myeloma.  They recommended obtaining multiple myeloma work-up- results can be discussed with [name], NP or Dr. Amalia Hailey, [phone].  Nephrology and neurology consulted for dialysis.   Assessment & Plan    Principal Problem:   Acute metabolic encephalopathy, ?  Hypertensive encephalopathy in the setting of dementia -Unclear etiology, much more alert and oriented today, follows commands.  -Patient had presented with accelerated hypertension.  BP 200/120 on admission.  Chest x-ray showed minimal vascular congestion, CT head was negative, sodium 134, albumin 2.5, neurology was consulted. -MRI of the brain showed no stroke, increased signal in deep GM indicative of possible hypertensive urgency -EEG showed mild to moderate generalized nonspecific cerebral dysfunction, no seizures -2D echo 11/27 had shown EF of 55 to 60% with grade  1 diastolic dysfunction -Per neurology no need for further stroke work-up -Dr. Ree Kida discussed with [facility] oncology, MRI head showed multiple osseous metastasis/multiple myeloma, recommended obtaining multiple myeloma work-up -Has a history of recurrent bacteremia, blood cultures negative so far -UA negative for UTI, urine culture less than 10,000 colonies  Active Problems: History of multiple myeloma ([place]) -Followed by oncology [facility], receives chemo infusions. -Myeloma panel in process -If patient has another episode of bacteremia, will need to hold chemotherapy.    Hypertensive urgency -Improving with HD, continue Imdur, hydralazine, clonidine, amlodipine, losartan  Elevated troponin Likely due to demand ischemia and hypertensive emergency, continue heparin drip 2D echo 11/27 had shown EF of 55 to 60% with grade 1 diastolic dysfunction  ESRD on HD, TTS Nephrology consulted, receiving HD per schedule  Code Status: Full CODE STATUS DVT Prophylaxis: Heparin Family Communication: Discussed in detail with the patient, all imaging results, lab results explained to the patient   Disposition Plan:   Time Spent in minutes  Procedures:  Hemodialysis  Consultants:   Nephrology Neurology  Antimicrobials:      Medications  Scheduled Meds: . amLODipine  10 mg Oral QPM  . calcium acetate  1,334 mg Oral TID  WC  . Chlorhexidine Gluconate Cloth  6 each Topical Q0600  . heparin      . isosorbide mononitrate  60 mg Oral Daily  . lamoTRIgine  50 mg Oral Daily  . latanoprost  1 drop Both Eyes QHS  . LORazepam  0.5 mg Oral QHS  . losartan  50 mg Oral QPM  . sevelamer carbonate  2,400 mg Oral TID WC  . sodium chloride flush  3 mL Intravenous Q12H  . sodium chloride flush  3 mL Intravenous Q12H  . valACYclovir  500 mg Oral Q48H   Continuous Infusions: . sodium chloride    . heparin 1,350 Units/hr (09/17/18 1530)   PRN Meds:.sodium chloride, acetaminophen **OR** acetaminophen,  cloNIDine, hydrALAZINE, senna-docusate, sodium chloride flush   Antibiotics   Anti-infectives (From admission, onward)   Start     Dose/Rate Route Frequency Ordered Stop   09/16/18 1000  valACYclovir (VALTREX) tablet 500 mg     500 mg Oral Every 48 hours 09/16/18 0400          Subjective:   [name] was seen and examined today.  Much more alert and awake today, following commands, no fevers, denies any specific complaints.  Patient denies dizziness, chest pain, shortness of breath, abdominal pain, N/V/D/C.  No acute issues overnight.   Objective:   Vitals:   09/18/18 1000 09/18/18 1030 09/18/18 1100 09/18/18 1119  BP: (!) 131/92 130/89 123/86 131/88  Pulse: 77 80 79 81  Resp: (!) 23 (!) 24 (!) 21 19  Temp:    98 F (36.7 C)  TempSrc:    Oral  SpO2:    99%  Weight:    53.6 kg  Height:        Intake/Output Summary (Last 24 hours) at 09/18/2018 1233 Last data filed at 09/18/2018 1119 Gross per 24 hour  Intake 802 ml  Output 3000 ml  Net -2198 ml     Wt Readings from Last 3 Encounters:  09/18/18 53.6 kg  09/04/18 57.6 kg  09/03/18 57.6 kg    Physical Exam  General: Alert and oriented x self, NAD, pleasant and cooperative  Eyes:  HEENT:  Cardiovascular: S1 S2 auscultated, Regular rate and rhythm. No pedal edema b/l  Respiratory: Clear to auscultation bilaterally, no wheezing, rales or rhonchi  Gastrointestinal: Soft, nontender, nondistended, + bowel sounds  Ext: no pedal edema bilaterally  Neuro: No FND's  Musculoskeletal: No digital cyanosis, clubbing  Skin: No rashes  Psych: Pleasant and cooperative   Data Reviewed:  I have personally reviewed following labs and imaging studies  Micro Results Recent Results (from the past 240 hour(s))  Urine culture     Status: Abnormal   Collection Time: 09/16/18  3:11 AM  Result Value Ref Range Status   Specimen Description URINE, CATHETERIZED  Final   Special Requests NONE  Final   Culture (A)   Final    <10,000 COLONIES/mL INSIGNIFICANT GROWTH Performed at [facility], [address] [place], [place] [address]    Report Status 09/17/2018 FINAL  Final  Culture, blood (Routine X 2) w Reflex to ID Panel     Status: None (Preliminary result)   Collection Time: 09/16/18  3:12 AM  Result Value Ref Range Status   Specimen Description BLOOD RIGHT ANTECUBITAL  Final   Special Requests   Final    BOTTLES DRAWN AEROBIC AND ANAEROBIC Blood Culture adequate volume   Culture   Final    NO GROWTH 2 DAYS Performed  at [facility], [place] [address] [place], [place] [address]    Report Status PENDING  Incomplete  Culture, blood (Routine X 2) w Reflex to ID Panel     Status: None (Preliminary result)   Collection Time: 09/16/18  3:15 AM  Result Value Ref Range Status   Specimen Description BLOOD RIGHT HAND  Final   Special Requests   Final    BOTTLES DRAWN AEROBIC ONLY Blood Culture results may not be optimal due to an inadequate volume of blood received in culture bottles   Culture   Final    NO GROWTH 2 DAYS Performed at [facility], [place] [address] [place], [place] [address]    Report Status PENDING  Incomplete  MRSA PCR Screening     Status: None   Collection Time: 09/16/18  8:10 PM  Result Value Ref Range Status   MRSA by PCR NEGATIVE NEGATIVE Final    Comment:        The GeneXpert MRSA Assay (FDA approved for NASAL specimens only), is one component of a comprehensive MRSA colonization surveillance program. It is not intended to diagnose MRSA infection nor to guide or monitor treatment for MRSA infections. Performed at [facility], [place] [address] [place], [place] [address]     Radiology Reports Dg Chest 2 View  Result Date: 08/25/2018 CLINICAL DATA:  Altered mental status EXAM: CHEST - 2 VIEW COMPARISON:  Chest x-rays dated 08/12/2018 and 06/26/2017 FINDINGS: Stable cardiomegaly. Lungs are clear. No pleural effusion or pneumothorax seen.  Osseous structures about the chest are unremarkable. IMPRESSION: 1. No active cardiopulmonary disease. No evidence of pneumonia or pulmonary edema. 2. Stable cardiomegaly. Electronically Signed   By: Franki Cabot M.D.   On: 08/25/2018 17:23   Ct Head Wo Contrast  Result Date: 08/25/2018 CLINICAL DATA:  New onset weakness after dialysis. Hypotension. Slurred speech. EXAM: CT HEAD WITHOUT CONTRAST TECHNIQUE: Contiguous axial images were obtained from the base of the skull through the vertex without intravenous contrast. COMPARISON:  CT head without contrast 07/26/2018 FINDINGS: Brain: Moderate atrophy and white matter is similar to the prior exam. No acute infarct, hemorrhage, or mass lesion is present. Diffuse hypoattenuation is present throughout the basal ganglia. A remote lacunar infarct is again seen in the left cerebellum. Ventricles are proportionate to the degree of atrophy. No significant extra-axial fluid collection present. Vascular: Atherosclerotic calcifications are present within the cavernous internal carotid arteries bilaterally. There is no hyperdense vessel. Skull: Multiple lucent lesions are again noted. There is no significant interval change. Findings are consistent with known multiple myeloma. There is a focal lesion involving the left arch of C1. Sinuses/Orbits: The paranasal sinuses and mastoid air cells are clear. Globes and orbits are within normal limits. IMPRESSION: 1. Stable appearance of atrophy and white matter disease. 2. No acute intracranial abnormality. 3. Multiple lucent lesions in the calvarium and left-sided arch of C1 consistent with known multiple myeloma. There is no significant progression. Electronically Signed   By: San Morelle M.D.   On: 08/25/2018 17:22   Mr Brain Wo Contrast  Result Date: 09/16/2018 CLINICAL DATA:  Altered mental status and weakness. History of end-stage renal disease on dialysis, multiple myeloma, hypertension. EXAM: MRI HEAD WITHOUT  CONTRAST TECHNIQUE: Multiplanar, multiecho pulse sequences of the brain and surrounding structures were obtained without intravenous contrast. COMPARISON:  CT HEAD September 16, 2018 and MRI head April 16, 2018 FINDINGS: INTRACRANIAL CONTENTS: No reduced diffusion to suggest acute ischemia. Numerous  infratentorial to lesser extent supratentorial chronic microhemorrhages. Old small LEFT cerebellar infarct. Hazy T2 hyperintense signal bilateral basal ganglia a increased from prior MRI. 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 Patchy supratentorial white matter FLAIR T2 hyperintensities. The ventricles and sulci are normal for patient's age. No suspicious parenchymal signal, masses, mass effect. No abnormal extra-axial fluid collections. No extra-axial masses. VASCULAR: Normal major intracranial vascular flow voids present at skull base. SKULL AND UPPER CERVICAL SPINE: No abnormal sellar expansion. Mildly reduced diffusion with low ADC values of numerous calvarial metastasis corresponding to known multiple myeloma. Heterogeneous calvarial and cervical spine bone marrow signal consistent with myeloproliferative disease. Craniocervical junction maintained. SINUSES/ORBITS: The mastoid air-cells and included paranasal sinuses are well-aerated.The included ocular globes and orbital contents are non-suspicious. OTHER: Patient is edentulous. IMPRESSION: 1. No acute infarct. 2. Increased basal ganglia and thalami edema seen with hypertensive encephalopathy, less likely rapidly progressing small vessel ischemic changes. 3. Moderate chronic small vessel ischemic changes. 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 4. Multiple osseous metastasis/multiple myeloma. Electronically Signed   By: Elon Alas M.D.   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 Daryll Brod, MD Guidance:  Ultrasound and fluoroscopic FLUOROSCOPY TIME:  Fluoroscopy Time: 1 minutes 0 seconds (2 mGy). MEDICATIONS: Ancef 2 g administered within 1 hour of the procedure ANESTHESIA/SEDATION: Versed 0.5 mg IV; Fentanyl 25 mcg IV; Moderate Sedation Time:  17 minutes The patient was continuously monitored during the procedure by the interventional radiology nurse under my direct supervision. CONTRAST:  None. COMPLICATIONS: None immediate. PROCEDURE: Informed consent was obtained from the patient following explanation of the procedure, risks, benefits and alternatives. The patient understands, agrees and consents for the procedure. All questions were addressed. A time out was performed. Maximal barrier sterile technique utilized including caps, mask, sterile gowns, sterile gloves, large sterile drape, hand hygiene, and 2% chlorhexidine scrub. Under sterile conditions and local anesthesia, right internal jugular micropuncture venous access was performed with ultrasound. Images were obtained for documentation. A guide wire was inserted followed by a transitional dilator. Next, a 0.035 guidewire was advanced into the IVC with a 5-French catheter. Measurements were obtained from the right venotomy site to the proximal right atrium. In the right infraclavicular chest, a subcutaneous tunnel was created under sterile conditions and local anesthesia. 1% lidocaine with epinephrine was utilized for this. The 19 cm tip to cuff palindrome catheter was tunneled subcutaneously to the venotomy site and inserted into the SVC/RA junction through a valved peel-away sheath. Position was confirmed with fluoroscopy. Images were obtained for documentation. Blood was aspirated from the catheter followed by saline and heparin flushes. The appropriate  volume and strength of heparin was instilled in each lumen. Caps were applied. The catheter was secured at the tunnel site with Gelfoam and a pursestring suture. The venotomy site was closed with subcuticular Vicryl suture. Dermabond was applied to the small right neck incision. A dry sterile dressing was applied. The catheter is ready for use. No immediate complications. IMPRESSION: Ultrasound and fluoroscopically guided right internal jugular tunneled hemodialysis catheter (19 cm tip to cuff palindrome catheter). Electronically Signed   By: Jerilynn Mages.  Shick M.D.   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 Daryll Brod, MD Guidance:  Ultrasound and fluoroscopic FLUOROSCOPY TIME:  Fluoroscopy Time: 1 minutes 0 seconds (2 mGy). MEDICATIONS: Ancef 2 g administered within 1 hour of the procedure ANESTHESIA/SEDATION: Versed 0.5 mg IV; Fentanyl 25 mcg IV; Moderate Sedation Time:  17 minutes The patient was continuously monitored during the procedure by the interventional radiology nurse under my direct supervision. CONTRAST:  None. COMPLICATIONS: None immediate. PROCEDURE: Informed consent was obtained from the patient following explanation of the procedure, risks, benefits and alternatives. The patient understands, agrees and consents for the procedure. All questions were addressed. A time out was performed. Maximal barrier sterile technique utilized including caps, mask, sterile gowns, sterile gloves, large sterile drape, hand hygiene, and 2% chlorhexidine scrub. Under sterile conditions and local anesthesia, right internal jugular micropuncture venous access was performed with ultrasound. Images were obtained  for documentation. A guide wire was inserted followed by a transitional dilator. Next, a 0.035 guidewire was advanced into the IVC with a 5-French catheter. Measurements were obtained from the right venotomy site to the proximal right atrium. In the right infraclavicular chest, a subcutaneous tunnel was created under sterile conditions and local anesthesia. 1% lidocaine with epinephrine was utilized for this. The 19 cm tip to cuff palindrome catheter was tunneled subcutaneously to the venotomy site and inserted into the SVC/RA junction through a valved peel-away sheath. Position was confirmed with fluoroscopy. Images were obtained for documentation. Blood was aspirated from the catheter followed by saline and heparin flushes. The appropriate volume and strength of heparin was instilled in each lumen. Caps were applied. The catheter was secured at the tunnel site with Gelfoam and a pursestring suture. The venotomy site was closed with subcuticular Vicryl suture. Dermabond was applied to the small right neck incision. A dry sterile dressing was applied. The catheter is ready for use. No immediate complications. IMPRESSION: Ultrasound and fluoroscopically guided right internal jugular tunneled hemodialysis catheter (19 cm tip to cuff palindrome catheter). Electronically Signed   By: Jerilynn Mages.  Shick M.D.   On: 08/29/2018 09:47   Dg Chest Port 1 View  Result Date: 09/16/2018 CLINICAL DATA:  Altered mental status. EXAM: PORTABLE CHEST 1 VIEW COMPARISON:  Radiographs 08/25/2018 FINDINGS: Right internal jugular dialysis catheter tip in the distal SVC. Unchanged cardiomegaly. Unchanged aortic tortuosity. Minimal vascular congestion without pulmonary edema. No focal airspace disease, large pleural effusion or pneumothorax. No acute osseous abnormalities are seen. IMPRESSION: Stable cardiomegaly. Minimal vascular congestion. Electronically Signed   By: Keith Rake M.D.   On: 09/16/2018 01:57   Dg Colon W/cm - Wo/w  Kub  Result Date: 09/01/2018 CLINICAL DATA:  GI bleed, anemia EXAM: BE WITH CONTRAST - WITHOUT AND WITH KUB CONTRAST:  Barium FLUOROSCOPY TIME:  Fluoroscopy Time:  2 minutes, 18 seconds Radiation Exposure Index (if provided by the fluoroscopic device): 32.9 mGy Number of Acquired Spot Images: 3 COMPARISON:  None. FINDINGS: The initial KUB demonstrates adequate prep, without a significant amount of stool in the colon. Vascular calcifications noted. Probable scarring the lung bases. This patient was confused and has limited mobility. We were able to turn her prone and in the left lateral decubitus and right lateral decubitus positions. Turning her supine was not realistic. Some of our image sequences have the patient's arm across her belly,  be reminded the patient multiple times to please move her arm by her sides. We were not able to turn up the patient into a full lateral projection. I used balloon compression in order to flatten out portions of the colon for better sensitivity. However, given the patient's difficulty in cooperating or turning, today's exam must be understood to have a reduced diagnostic sensitivity and specificity compared to a normal exam in a patient able to cooperate The barium column was advanced to tell barium into the terminal ileum. Upon compression fluoroscopy, scattered diverticula of the colon were observed without a concentrated appearance of diverticulosis. Patient was turned into decubitus positions in order to better characterize the bowel. There is some redundancy of the colon in the vicinity of the splenic flexure making it difficult to separate the loops of bowel. We were able to reflux the terminal ileum, which appears unremarkable. No appreciable colon mass is identified. No visualized filling defect in the barium column of the colon to favor a significant polyp. A post evacuation image of the abdomen demonstrates no appreciable filling defect, and shows filling of the colon.  IMPRESSION: 1. No mass or significant polyp is identified. Please note that the patient had some confusion and limited mobility which does mildly reduced diagnostic sensitivity and specificity. 2. Scattered colonic diverticula. Electronically Signed   By: Van Clines M.D.   On: 09/01/2018 16:29   Ir Dialy Shunt Intro Needle/intracath Initial W/img Left  Result Date: 08/29/2018 INDICATION: End-stage renal disease, occluded left upper arm AV fistula EXAM: Left AV fistulogram (limited) MEDICATIONS: None. ANESTHESIA/SEDATION: Moderate Sedation Time:  None. The patient was continuously monitored during the procedure by the interventional radiology nurse under my direct supervision. FLUOROSCOPY TIME:  Fluoroscopy Time: 1 minutes 0 seconds (2 mGy). COMPLICATIONS: None immediate. PROCEDURE: Informed written consent was obtained from the patient after a thorough discussion of the procedural risks, benefits and alternatives. All questions were addressed. Maximal Sterile Barrier Technique was utilized including caps, mask, sterile gowns, sterile gloves, sterile drape, hand hygiene and skin antiseptic. A timeout was performed prior to the initiation of the procedure. Under sterile conditions, Angiocath access performed of the left upper arm AV fistula. Limited contrast injection for fistulagram performed. Left upper arm fistulagram: This demonstrates near complete thrombotic occlusion of the left upper arm brachiocephalic fistula. 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 By ultrasound, there is a large amount of thrombus burden throughout the entire left upper arm aneurysmal fistula extending to the axilla. Therefore, any aggressive intervention would be low yield for long term durability and also high risk for pulmonary embolism. 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 Large degree of thrombus burden throughout  the fistula. See above comment. ACCESS: Because of the above findings, a tunneled right IJ dialysis catheter will be inserted. These results were called by telephone at the time of interpretation on 08/29/2018 at 9:51 am to Dr. Roney Jaffe , who verbally acknowledged these results. Electronically Signed   By: Jerilynn Mages.  Shick M.D.   On: 08/29/2018 09:51   Ct Head Code Stroke Wo Contrast  Result Date: 09/16/2018 CLINICAL DATA:  Code stroke. RIGHT facial droop. Aphasic. History of multiple myeloma, hypertension. EXAM: CT HEAD WITHOUT CONTRAST TECHNIQUE: Contiguous axial images were obtained from the base of the skull through the vertex without intravenous contrast. COMPARISON:  CT HEAD August 25, 2018 and MRI head April 16, 2016 FINDINGS: Mild  motion degraded examination. BRAIN: No intraparenchymal hemorrhage, mass effect nor midline shift. The ventricles and sulci are normal for age. 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 Old small LEFT cerebellar infarct. Patchy supratentorial white matter hypodensities. No acute large vascular territory infarcts. No abnormal extra-axial fluid collections. Basal cisterns are patent. VASCULAR: Mild calcific atherosclerosis of the carotid siphons. SKULL: No skull fracture. Multiple soft tissue masses within the calvarium including LEFT sphenoid eroding the inner and outer tables. No significant scalp soft tissue swelling. SINUSES/ORBITS: Mild paranasal sinus mucosal thickening. Mastoid air cells are well aerated. Soft tissue within the external auditory canals most compatible with cerumen.The included ocular globes and orbital contents are non-suspicious. OTHER: None. ASPECTS [facility] Stroke Program Early CT Score) - Ganglionic level infarction (caudate, lentiform nuclei, internal capsule, insula, M1-M3 cortex): 7 - Supraganglionic infarction (M4-M6 cortex): 3 Total score (0-10 with 10 being normal): 10 IMPRESSION: 1. Mild motion degraded examination.  No acute  intracranial process. 2. ASPECTS is 10. 3. Mild chronic small vessel ischemic changes. 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 4. Multiple osseous lesions consistent with known multiple myeloma. 5. Critical Value/emergent results text paged to [place] via [employer] secure system on 09/16/2018 at 12:18 am, including interpreting physician's phone number. Electronically Signed   By: Elon Alas M.D.   On: 09/16/2018 00:18    Lab Data:  CBC: Recent Labs  Lab 09/16/18 0008 09/16/18 0010 09/17/18 0451 09/18/18 0513  WBC 6.5  --  4.7 4.1  NEUTROABS 4.9  --   --   --   HGB 9.5* 11.2* 9.9* 8.3*  HCT 31.9* 33.0* 33.2* 27.2*  MCV 99.4  --  99.1 99.6  PLT 118*  --  108* 025*   Basic Metabolic Panel: Recent Labs  Lab 09/16/18 0008 09/16/18 0010 09/16/18 0028 09/16/18 0414 09/17/18 0451 09/18/18 0645  NA 134* 135  --  133* 132* 128*  K 4.0 3.9  --  5.0 3.7 3.6  CL 94* 97*  --  93* 96* 93*  CO2 29  --   --  _0 GLUCOSE 85 84  --  81 81 79  BUN 25* 28*  --  28* 11 22  CREATININE 6.60* 6.90*  --  6.72* 3.74* 5.33*  CALCIUM 9.9  --   --  9.5 8.4* 8.9  MG  --   --  2.8*  --   --   --   PHOS  --   --   --   --  2.1* 2.6   GFR: Estimated Creatinine Clearance: 7.1 mL/min (A) (by C-G formula based on SCr of 5.33 mg/dL (H)). Liver Function Tests: Recent Labs  Lab 09/16/18 0008 09/17/18 0451 09/18/18 0645  AST 25  --   --   ALT 9  --   --   ALKPHOS 51  --   --   BILITOT 0.8  --   --   PROT 9.4*  --   --   ALBUMIN 2.5* 2.4* 2.1*   No results for input(s): LIPASE, AMYLASE in the last 168 hours. No results for input(s): AMMONIA in the last 168 hours. Coagulation Profile: Recent Labs  Lab 09/16/18 0008  INR 1.15   Cardiac Enzymes: Recent Labs  Lab 09/16/18 0414 09/16/18 0956 09/16/18 1632  TROPONINI 0.28* 0.22* 0.15*   BNP (last 3 results) No results for input(s): PROBNP in the last 8760 hours. HbA1C: Recent Labs    09/16/18 0008  HGBA1C 4.6*   CBG: Recent Labs  Lab 09/16/18 0025 09/16/18 0758 09/16/18 0824 09/16/18 1131  GLUCAP 79 69* 165* 93   Lipid Profile: Recent Labs    09/16/18 0414  CHOL 135  HDL 60  LDLCALC 57  TRIG 90  CHOLHDL 2.3   Thyroid Function Tests: Recent Labs    09/16/18 0525  TSH 2.491   Anemia Panel: No results for input(s): VITAMINB12, FOLATE, FERRITIN, TIBC, IRON, RETICCTPCT in the last 72 hours. Urine analysis:    Component Value Date/Time   COLORURINE STRAW (A) 09/16/2018 0326   APPEARANCEUR CLEAR 09/16/2018 0326   LABSPEC 1.010 09/16/2018 0326   PHURINE 9.0 (H) 09/16/2018 0326   GLUCOSEU 50 (A) 09/16/2018 0326   HGBUR NEGATIVE 09/16/2018 0326   BILIRUBINUR NEGATIVE 09/16/2018 0326   KETONESUR 5 (A) 09/16/2018 0326   PROTEINUR 100 (A) 09/16/2018 0326   UROBILINOGEN 0.2 04/10/2015 1800   NITRITE NEGATIVE 09/16/2018 0326   LEUKOCYTESUR NEGATIVE 09/16/2018 0326     Kaleeyah Cuffie M.D. [facility] 09/18/2018, 12:33 PM  Pager: [phone] Between 7am to 7pm - call Pager - [phone]  After 7pm go to [email or web address] - password [email or web address]  Call night coverage person covering after 7pm

## 2018-09-18 NOTE — Progress Notes (Addendum)
NEUROLOGY PROGRESS NOTE  Subjective: Patient currently labile, crying, stating she wants to go back to her room at the nursing home.  Able to show me her thumb and count my fingers  Exam: Vitals:   09/18/18 1100 09/18/18 1119  BP: 123/86 131/88  Pulse: 79 81  Resp: (!) 21 19  Temp:  98 F (36.7 C)  SpO2:  99%    Physical Exam   HEENT-  Normocephalic, no lesions, without obvious abnormality.  Normal external eye and conjunctiva.   Extremities- Warm, dry and intact Musculoskeletal-no joint tenderness, deformity or swelling Skin-warm and dry, no hyperpigmentation, vitiligo, or suspicious lesions    Neuro:  Mental Status: Alert, crying not oriented to place, month, year.  Stating she wants to go back to her room at the nursing home.  Speech fluent without evidence of aphasia.  Follows only simple commands Cranial Nerves: II:  Visual fields grossly normal,  III,IV, VI: ptosis not present, extra-ocular motions intact bilaterally pupils equal, round, reactive to light and accommodation V,VII: Face symmetrical,  VIII: hearing normal bilaterally Motor: Moving all extremities antigravity Deep Tendon Reflexes: Depressed throughout   Medications:  Scheduled: . amLODipine  10 mg Oral QPM  . calcium acetate  1,334 mg Oral TID WC  . Chlorhexidine Gluconate Cloth  6 each Topical Q0600  . heparin      . isosorbide mononitrate  60 mg Oral Daily  . lamoTRIgine  50 mg Oral Daily  . latanoprost  1 drop Both Eyes QHS  . LORazepam  0.5 mg Oral QHS  . losartan  50 mg Oral QPM  . sevelamer carbonate  2,400 mg Oral TID WC  . sodium chloride flush  3 mL Intravenous Q12H  . sodium chloride flush  3 mL Intravenous Q12H  . valACYclovir  500 mg Oral Q48H   Continuous: . sodium chloride    . heparin 1,350 Units/hr (09/17/18 1530)    Pertinent Labs/Diagnostics: Sodium 128 Creatinine 5.33      Etta Quill PA-C [facility] [phone]   Assessment: Confusion likely  secondary hypertensive encephalopathy.  Blood pressures have been stable.  MRI and EEG does not show any abnormalities or seizure activity. Impression:  -Most likely worsening of dementia in the setting of hypertensive encephalopathy  Recommendations: -Continue blood pressure control -No further neurological work-up warranted - Neurology will sign off at this time    09/18/2018, 12:34 PM    NEUROHOSPITALIST ADDENDUM Performed a face to face diagnostic evaluation.   I have reviewed the contents of history and physical exam as documented by PA/ARNP/Resident and agree with above documentation.  I have discussed and formulated the above plan as documented. Edits to the note have been made as needed.   Patient is more alert and answering questions and following commands.  This is much improvement from 2 days ago.  Likely cause for presentation was hypertensive emergency with MRI negative for stroke.   Karena Addison Daelyn Mozer MD [facility] [phone]   If 7pm to 7am, please call on call as listed on AMION.

## 2018-09-18 NOTE — Progress Notes (Signed)
[facility] Progress Note  Subjective: more alert today, bcx's neg , prob dementia +HTNsive enceph per neurology  Vitals:   09/18/18 1030 09/18/18 1100 09/18/18 1119 09/18/18 1235  BP: 130/89 123/86 131/88 (!) 141/93  Pulse: 80 79 81   Resp: (!) 24 (!) 21 19   Temp:   98 F (36.7 C)   TempSrc:   Oral   SpO2:   99%   Weight:   53.6 kg   Height:        Inpatient medications: . amLODipine  10 mg Oral QPM  . calcium acetate  1,334 mg Oral TID WC  . Chlorhexidine Gluconate Cloth  6 each Topical Q0600  . heparin      . isosorbide mononitrate  60 mg Oral Daily  . lamoTRIgine  50 mg Oral Daily  . latanoprost  1 drop Both Eyes QHS  . LORazepam  0.5 mg Oral QHS  . losartan  50 mg Oral QPM  . sevelamer carbonate  2,400 mg Oral TID WC  . sodium chloride flush  3 mL Intravenous Q12H  . sodium chloride flush  3 mL Intravenous Q12H  . valACYclovir  500 mg Oral Q48H   . sodium chloride    . heparin 1,350 Units/hr (09/17/18 1530)   sodium chloride, acetaminophen **OR** acetaminophen, cloNIDine, hydrALAZINE, senna-docusate, sodium chloride flush  Iron/TIBC/Ferritin/ %Sat    Component Value Date/Time   IRON 149 (H) 03/13/2014 0442   TIBC NOT CALC 03/13/2014 0442   FERRITIN 2,786 (H) 03/13/2014 0442   IRONPCTSAT NOT CALC 03/13/2014 0442    Exam: General: WDWN elderly female NAD  Head: NCAT sclera not icteric MMM Neck: Supple. No JVD  Lungs: CTA bilaterally without wheezes, rales, or rhonchi. Breathing is unlabored. Heart: RRR 2/6 SEM  Abdomen: soft NT + BS Lower extremities:without edema or ischemic changes, no open wounds  Neuro: Does not follow commands. Does not move extremities.  Psych:  Nonverbal. Not responding to questions.  Dialysis Access: R  Chest [facility]. L AVF (clotted, needs new access)  Dialysis Orders: AF TTS 3.5h   425/1.5   58kg    2K/2.25Ca  TDC No heparin Hectorol 2 mcg IV TIW Retacrit 18000 U IV TIW Venofer 163m IV x  5   Assessment/Plan 1. Acute encephalopathy/gen weakness - work up negative, per neuro prob dementia +HTNsive enceph. Improving now.  2. ESRD - Labs stable. HD TTS.  HD today.  3. Hypertension/volume  - wt's down significantly. Vol down/ better.  4. Anemia  - Hgb 11.2. No ESA needs currently  5. Metabolic bone disease -  Continue VDRA/Renvela binder when eating  6. Nutrition - Add prostat for low albumin  7. Multiple myeloma. Chemo infusions Followed by oncology [facility].    RKelly SplinterMD [facility] pager [phone]  09/18/2018, 2:49 PM   Recent Labs  Lab 09/16/18 0008  09/17/18 0451 09/18/18 0645  NA 134*   < > 132* 128*  K 4.0   < > 3.7 3.6  CL 94*   < > 96* 93*  CO2 29   < > 23 27  GLUCOSE 85   < > 81 79  BUN 25*   < > 11 22  CREATININE 6.60*   < > 3.74* 5.33*  CALCIUM 9.9   < > 8.4* 8.9  PHOS  --   --  2.1* 2.6  ALBUMIN 2.5*  --  2.4* 2.1*  INR 1.15  --   --   --    < > =  values in this interval not displayed.   Recent Labs  Lab 09/16/18 0008  AST 25  ALT 9  ALKPHOS 51  BILITOT 0.8  PROT 9.4*   Recent Labs  Lab 09/16/18 0008  09/17/18 0451 09/18/18 0513  WBC 6.5  --  4.7 4.1  NEUTROABS 4.9  --   --   --   HGB 9.5*   < > 9.9* 8.3*  HCT 31.9*   < > 33.2* 27.2*  MCV 99.4  --  99.1 99.6  PLT 118*  --  108* 128*   < > = values in this interval not displayed.

## 2018-09-19 LAB — HEPARIN LEVEL (UNFRACTIONATED): Heparin Unfractionated: 0.37 IU/mL (ref 0.30–0.70)

## 2018-09-19 LAB — CBC
HCT: 27.9 % — ABNORMAL LOW (ref 36.0–46.0)
Hemoglobin: 8.3 g/dL — ABNORMAL LOW (ref 12.0–15.0)
MCH: 29.7 pg (ref 26.0–34.0)
MCHC: 29.7 g/dL — ABNORMAL LOW (ref 30.0–36.0)
MCV: 100 fL (ref 80.0–100.0)
PLATELETS: 143 10*3/uL — AB (ref 150–400)
RBC: 2.79 MIL/uL — ABNORMAL LOW (ref 3.87–5.11)
RDW: 20.2 % — ABNORMAL HIGH (ref 11.5–15.5)
WBC: 3.6 10*3/uL — ABNORMAL LOW (ref 4.0–10.5)
nRBC: 0.5 % — ABNORMAL HIGH (ref 0.0–0.2)

## 2018-09-19 MED ORDER — SEVELAMER CARBONATE 800 MG PO TABS: 2400.0000 mg | ORAL_TABLET | Freq: Three times a day (TID) | ORAL | Status: DC

## 2018-09-19 MED ORDER — LORAZEPAM 0.5 MG PO TABS: 0.5000 mg | ORAL_TABLET | Freq: Every day | ORAL | 0 refills | Status: DC

## 2018-09-19 MED ORDER — SIMETHICONE 80 MG PO CHEW
80.0000 mg | CHEWABLE_TABLET | Freq: Once | ORAL | Status: AC
  Administered 2018-09-19: 80 mg via ORAL
  Filled 2018-09-19: qty 1

## 2018-09-19 NOTE — Progress Notes (Signed)
CSW aware of patient DC orders- CSW contacting patients facility, [facility], to determine if they are able to accept patient today.   Will continue to update.   Kingsley Spittle, [place]  [phone]

## 2018-09-19 NOTE — Progress Notes (Signed)
Patient is set to discharge to [facility] today- patient is a long term resident at facility. Patient, daughter, and brother: [name] and [name] are aware. Discharge packet given to RN. PTAR called for transport.   Assessment from previous admission on 11/27 follows is below.   Kingsley Spittle, LCSW Clinical Social Worker [phone]     Clinical Social Work Assessment  Patient Details  Name: [name] MRN: [id number] Date of Birth: 1941/07/01  Date of referral:  08/27/18               Reason for consult:  Facility Placement, Discharge Planning                    Permission sought to share information with:  Facility Sport and exercise psychologist, Family Supports Permission granted to share information::  No             Name::     Charolotte Capuchin             Agency::  [facility]             Relationship::  brother             Contact Information:  [phone]  Housing/Transportation Living arrangements for the past 2 months:  [place] of Information:  Siblings Patient Interpreter Needed:  None Criminal Activity/Legal Involvement Pertinent to Current Situation/Hospitalization:  No - Comment as needed Significant Relationships:  Siblings, Adult Children Lives with:  Facility Resident Do you feel safe going back to the place where you live?  Yes Need for family participation in patient care:  Yes (Comment)  Care giving concerns: Patient is a long term resident at [facility]. Recent re-admit.   Social Worker assessment / plan: CSW spoke to patient's brother, [name], on the phone. Patient answers orientation questions correctly, but has been agitated during admission. Patient's brother reported that patient has lived at [facility] for about three years and receives dialysis in [place]. Brother indicated that the plan is for patient is to return to [place] at discharge.  CSW confirmed with [facility] admissions that patient is a long term  care resident and they will accept her back when medically cleared.   CSW to follow for medical readiness and support with discharge planning.  Employment status:  Retired Forensic scientist:  Information systems manager, Medicaid In [place] PT Recommendations:  Not assessed at this time Information / Referral to community resources:  [place]  Patient/Family's Response to care: Brother appreciative of care.  Patient/Family's Understanding of and Emotional Response to Diagnosis, Current Treatment, and Prognosis: Brother with good understanding of patient's conditions and has been in touch with staff regarding her treatment. Brother agreeable for patient to return to SNF.  Emotional Assessment Appearance:  Appears stated age Attitude/Demeanor/Rapport:  Unable to Assess Affect (typically observed):  Unable to Assess Orientation:  Fluctuating Orientation (Suspected and/or reported Sundowners) Alcohol / Substance use:  Not Applicable Psych involvement (Current and /or in the community):  No (Comment)  Discharge Needs  Concerns to be addressed:  Discharge Planning Concerns, Care Coordination Readmission within the last 30 days:  Yes Current discharge risk:  Physical Impairment, Chronically ill Barriers to Discharge:  Continued Medical Work up   Estanislado Emms, LCSW 08/27/2018, 3:01 PM

## 2018-09-19 NOTE — NC FL2 (Addendum)
[place] MEDICAID FL2 LEVEL OF CARE SCREENING TOOL     IDENTIFICATION  Patient Name: [name] Birthdate: 1941/02/14 Sex: female Admission Date (Current Location): 09/16/2018  [facility] [place] Number:  Herbalist and Address:  [facility], [place] [address], [place], [place] [address]      Provider Number: [id number]  Attending Physician Name and Address:  Mendel Corning, MD  Relative Name and Phone Number:       Current Level of Care: Hospital Recommended Level of Care: [place] Prior Approval Number:    Date Approved/Denied:   PASRR Number:   Discharge Plan: SNF    Current Diagnoses: Patient Active Problem List   Diagnosis Date Noted  . Acute encephalopathy 09/16/2018  . NSTEMI (non-ST elevated myocardial infarction) ([place]) 09/16/2018  . Prolonged QT interval 09/16/2018  . DNR (do not resuscitate) discussion   . Palliative care by specialist   . End-stage renal disease on hemodialysis ([place])   . Hemodialysis catheter infection ([place])   . Anemia associated with chronic renal failure 08/26/2018  . Anemia 08/26/2018  . Hypothermia 08/26/2018  . Weakness generalized 08/25/2018  . Gram-negative bacteremia   . Acute lower UTI 08/12/2018  . Swelling of joint of left wrist 08/12/2018  . Rectal bleeding 08/12/2018  . Neurocognitive deficits 06/05/2018  . Altered mental status 05/28/2018  . Macrocytosis 05/01/2018  . Hypertension, uncontrolled 04/16/2018  . ESRD needing dialysis ([place]) 04/16/2018  . Encephalopathy acute 04/16/2018  . Anxiety and depression 04/16/2018  . Hypertension, accelerated 04/16/2018  . Pressure ulcer, stage I 04/16/2018  . Abnormal chest x-ray 12/26/2017  . HCAP (healthcare-associated pneumonia) 04/24/2017  . Sepsis ([place]) 04/24/2017  . Nausea and vomiting 04/24/2017  . Thrombocytopenia ([place]) 04/24/2017  . Hyperkalemia 01/16/2017  . IBS (irritable bowel syndrome) 12/27/2016  . Mood  disorder ([place]) 07/19/2016  . Diastolic dysfunction 20/25/4270  . Insomnia 04/27/2016  . Hyponatremia 03/12/2016  . Diarrhea 03/12/2016  . Hypertensive urgency 02/18/2016  . Swelling of joint of left knee 02/18/2016  . Leukopenia due to antineoplastic chemotherapy ([place]) 09/03/2015  . Malnutrition of moderate degree 09/02/2015  . GERD (gastroesophageal reflux disease) 04/23/2015  . 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 Multiple myeloma ([place]) 03/11/2014  . Hypertension associatd with end stage renal disease on dialysis 03/11/2014    Orientation RESPIRATION BLADDER Height & Weight     Self  Normal   Weight: 122 lb 5.7 oz (55.5 kg) Height:  _0  (160 cm)  BEHAVIORAL SYMPTOMS/MOOD NEUROLOGICAL BOWEL NUTRITION STATUS        Diet(DYS 2 )  AMBULATORY STATUS COMMUNICATION OF NEEDS Skin   Extensive Assist Verbally Normal                       Personal Care Assistance Level of Assistance  Bathing, Feeding, Dressing Bathing Assistance: Maximum assistance Feeding assistance: Limited assistance Dressing Assistance: Maximum assistance     Functional Limitations Info    Sight Info: Adequate Hearing Info: Adequate Speech Info: Adequate    SPECIAL CARE FACTORS FREQUENCY                       Contractures      Additional Factors Info  Code Status, Allergies Code Status Info: Full Code  Allergies Info: PHENERGAN PROMETHAZINE HCL            Current Medications (09/19/2018):  This is the current hospital active medication  list Current Facility-Administered Medications  Medication Dose Route Frequency Provider Last Rate Last Dose  . 0.9 %  sodium chloride infusion  250 mL Intravenous PRN Opyd, Ilene Qua, MD      . acetaminophen (TYLENOL) tablet 650 mg  650 mg Oral Q6H PRN Opyd, Ilene Qua, MD   650 mg at 09/19/18 0335   Or  . acetaminophen (TYLENOL) suppository 650 mg  650 mg Rectal Q6H PRN Opyd, Ilene Qua, MD      . amLODipine (NORVASC) tablet 10 mg  10 mg Oral  QPM Mikhail, Maryann, DO   10 mg at 09/18/18 1717  . calcium acetate (PHOSLO) capsule 1,334 mg  1,334 mg Oral TID WC Opyd, Ilene Qua, MD   1,334 mg at 09/19/18 1249  . Chlorhexidine Gluconate Cloth 2 % PADS 6 each  6 each Topical Q0600 Lynnda Child, PA-C   6 each at 09/19/18 0601  . cloNIDine (CATAPRES) tablet 0.1 mg  0.1 mg Oral Q8H PRN Cristal Ford, DO      . hydrALAZINE (APRESOLINE) injection 10 mg  10 mg Intravenous Q4H PRN Opyd, Ilene Qua, MD   10 mg at 09/17/18 1244  . isosorbide mononitrate (IMDUR) 24 hr tablet 60 mg  60 mg Oral Daily Opyd, Ilene Qua, MD   60 mg at 09/19/18 0820  . lamoTRIgine (LAMICTAL) tablet 50 mg  50 mg Oral Daily Opyd, Ilene Qua, MD   50 mg at 09/19/18 0819  . latanoprost (XALATAN) 0.005 % ophthalmic solution 1 drop  1 drop Both Eyes QHS Opyd, Ilene Qua, MD   1 drop at 09/18/18 2155  . LORazepam (ATIVAN) tablet 0.5 mg  0.5 mg Oral QHS Opyd, Ilene Qua, MD   0.5 mg at 09/18/18 2153  . losartan (COZAAR) tablet 50 mg  50 mg Oral QPM Mikhail, Maryann, DO   50 mg at 09/18/18 1717  . senna-docusate (Senokot-S) tablet 1 tablet  1 tablet Oral QHS PRN Opyd, Ilene Qua, MD      . sevelamer carbonate (RENVELA) tablet 2,400 mg  2,400 mg Oral TID WC Opyd, Ilene Qua, MD   2,400 mg at 09/19/18 1249  . sodium chloride flush (NS) 0.9 % injection 3 mL  3 mL Intravenous Q12H Opyd, Ilene Qua, MD   3 mL at 09/18/18 2152  . sodium chloride flush (NS) 0.9 % injection 3 mL  3 mL Intravenous Q12H Opyd, Ilene Qua, MD   3 mL at 09/19/18 0922  . sodium chloride flush (NS) 0.9 % injection 3 mL  3 mL Intravenous PRN Opyd, Ilene Qua, MD      . valACYclovir (VALTREX) tablet 500 mg  500 mg Oral Q48H Opyd, Ilene Qua, MD   500 mg at 09/18/18 1248     Discharge Medications: Please see discharge summary for a list of discharge medications.  Relevant Imaging Results:  Relevant Lab Results:   Additional Information: SSN: [id number]    [place] Anna, LCSW

## 2018-09-19 NOTE — Progress Notes (Signed)
ANTICOAGULATION CONSULT NOTE  Pharmacy Consult for heparin Indication: chest pain/ACS    Patient Measurements: Height: 5\' 3"  (160 cm) Weight: 122 lb 5.7 oz (55.5 kg) IBW/kg (Calculated) : 52.4  Hep Wt: 57.6 kg  Vital Signs: Temp: 97.7 F (36.5 C) (12/20 0740) Temp Source: Axillary (12/20 0740) BP: 179/103 (12/20 0740) Pulse Rate: 59 (12/20 0740)  Labs: Recent Labs    09/16/18 0956  09/16/18 1632  09/17/18 0451  09/17/18 2256 09/18/18 0513 09/18/18 0645 09/19/18 0508  HGB  --   --   --    < > 9.9*  --   --  8.3*  --  8.3*  HCT  --   --   --   --  33.2*  --   --  27.2*  --  27.9*  PLT  --   --   --   --  108*  --   --  128*  --  143*  HEPARINUNFRC  --    < >  --    < > 0.24*   < > 0.40 0.42  --  0.37  CREATININE  --   --   --   --  3.74*  --   --   --  5.33*  --   TROPONINI 0.22*  --  0.15*  --   --   --   --   --   --   --    < > = values in this interval not displayed.    Estimated Creatinine Clearance: 7.1 mL/min (A) (by C-G formula based on SCr of 5.33 mg/dL (H)).   Assessment: 77yo female presented to ED as code stroke, unclear stroke exam vs hypertensive urgency/emergency vs encephalopathy, now pt w/ EKG changes and elevated troponin, to begin heparin. 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  Heparin level therapeutic at 0.37 this AM, H/H low but stable, plts 143, no bleeding noted at this time.  Goal of Therapy:  Heparin level 0.3-0.7 units/ml Monitor platelets by anticoagulation protocol: Yes   Plan:  Continue heparin gtt at 1350 units/hr Daily heparin level, CBC, s/s bleeding  Bertis Ruddy, PharmD Clinical Pharmacist Please check AMION for all [place] numbers 09/19/2018 8:23 AM

## 2018-09-19 NOTE — Progress Notes (Signed)
[facility] Progress Note  Subjective: bcx's neg, may be going back to SNF today  Vitals:   09/18/18 2332 09/19/18 0354 09/19/18 0500 09/19/18 0740  BP: (!) 134/96 (!) 188/109  (!) 179/103  Pulse: 74 64  (!) 59  Resp:  19  (!) 22  Temp: 98.4 F (36.9 C) 99 F (37.2 C)  97.7 F (36.5 C)  TempSrc: Oral Oral  Axillary  SpO2: 100% 96%  98%  Weight:   55.5 kg   Height:        Inpatient medications: . amLODipine  10 mg Oral QPM  . calcium acetate  1,334 mg Oral TID WC  . Chlorhexidine Gluconate Cloth  6 each Topical Q0600  . isosorbide mononitrate  60 mg Oral Daily  . lamoTRIgine  50 mg Oral Daily  . latanoprost  1 drop Both Eyes QHS  . LORazepam  0.5 mg Oral QHS  . losartan  50 mg Oral QPM  . sevelamer carbonate  2,400 mg Oral TID WC  . sodium chloride flush  3 mL Intravenous Q12H  . sodium chloride flush  3 mL Intravenous Q12H  . valACYclovir  500 mg Oral Q48H   . sodium chloride     sodium chloride, acetaminophen **OR** acetaminophen, cloNIDine, hydrALAZINE, senna-docusate, sodium chloride flush  Iron/TIBC/Ferritin/ %Sat    Component Value Date/Time   IRON 149 (H) 03/13/2014 0442   TIBC NOT CALC 03/13/2014 0442   FERRITIN 2,786 (H) 03/13/2014 0442   IRONPCTSAT NOT CALC 03/13/2014 0442    Exam: General: WDWN elderly female NAD  Head: NCAT sclera not icteric MMM Neck: Supple. No JVD  Lungs: CTA bilaterally without wheezes, rales, or rhonchi. Breathing is unlabored. Heart: RRR 2/6 SEM  Abdomen: soft NT + BS Lower extremities:without edema or ischemic changes, no open wounds  Neuro: Does not follow commands. Does not move extremities.  Psych:  Nonverbal. Not responding to questions.  Dialysis Access: R  Chest [facility]. L AVF (clotted, needs new access)  Dialysis Orders: AF TTS 3.5h   425/1.5   58kg    2K/2.25Ca  TDC No heparin Hectorol 2 mcg IV TIW Retacrit 18000 U IV TIW Venofer 135m IV x 5   Assessment/Plan 1. Acute encephalopathy/gen  weakness - work up negative, per neuro prob dementia +HTNsive enceph. Improved , back to baseline. Prob dc to SNF 2. ESRD - Labs stable. HD TTS.  Next HD 12/21 3. Hypertension/volume  - wt's down, under dry by 2-3kg 4. Anemia  - Hgb 11.2 > 8's here. Transfuse prn. No ESA needs currently  5. Metabolic bone disease -  Continue VDRA/Renvela binder when eating  6. Nutrition - Add prostat for low albumin  7. Multiple myeloma. Chemo infusions Followed by oncology [facility].    RKelly SplinterMD [facility] pager [phone]  09/19/2018, 11:45 AM   Recent Labs  Lab 09/16/18 0008  09/17/18 0451 09/18/18 0645  NA 134*   < > 132* 128*  K 4.0   < > 3.7 3.6  CL 94*   < > 96* 93*  CO2 29   < > 23 27  GLUCOSE 85   < > 81 79  BUN 25*   < > 11 22  CREATININE 6.60*   < > 3.74* 5.33*  CALCIUM 9.9   < > 8.4* 8.9  PHOS  --   --  2.1* 2.6  ALBUMIN 2.5*  --  2.4* 2.1*  INR 1.15  --   --   --    < > =  values in this interval not displayed.   Recent Labs  Lab 09/16/18 0008  AST 25  ALT 9  ALKPHOS 51  BILITOT 0.8  PROT 9.4*   Recent Labs  Lab 09/16/18 0008  09/18/18 0513 09/19/18 0508  WBC 6.5   < > 4.1 3.6*  NEUTROABS 4.9  --   --   --   HGB 9.5*   < > 8.3* 8.3*  HCT 31.9*   < > 27.2* 27.9*  MCV 99.4   < > 99.6 100.0  PLT 118*   < > 128* 143*   < > = values in this interval not displayed.

## 2018-09-19 NOTE — Progress Notes (Signed)
Pt. Being discharged back to [facility]. Pt. Leaving unit via PTAR. IV removed. Discharge packet and all personal belongings with PTAR.

## 2018-09-19 NOTE — Care Management Important Message (Signed)
Important Message  Patient Details  Name: [name] MRN: [id number] Date of Birth: 1941/06/24   Medicare Important Message Given:  Yes    [name] 09/19/2018, 2:16 PM

## 2018-09-19 NOTE — Discharge Summary (Signed)
Physician Discharge Summary   Patient ID: [name] MRN: [id number] DOB/AGE: 04/30/1941 77 y.o.  Admit date: 09/16/2018 Discharge date: 09/19/2018  Primary Care Physician:  Hendricks Limes, MD   Recommendations for Outpatient Follow-up:  1. Follow up with PCP in 1-2 weeks 2. Needs assistance with feeding 3. Please titrate antihypertensives.  Home Health: Patient being discharged to skilled nursing facility Equipment/Devices:    CODE STATUS: FULL Diet recommendation: Dysphagia 2 diet with thin liquids   Discharge Diagnoses:   . Acute encephalopathy likely due to hypertensive encephalopathy, superimposed on dementia . Anemia associated with chronic renal failure . Anxiety and depression . Hypertensive emergency with encephalopathy . Multiple myeloma ([place]) . ESRD on hemodialysis TTS . Prolonged QT interval   Consults: Nephrology    Allergies:   Allergies  Allergen Reactions  . Phenergan [Promethazine Hcl] Other (See Comments)    Acute encephalopathy in the context of refusal to go to hemodialysis and administration of Phenergan for nausea and vomiting     DISCHARGE MEDICATIONS: Allergies as of 09/19/2018      Reactions   Phenergan [promethazine Hcl] Other (See Comments)   Acute encephalopathy in the context of refusal to go to hemodialysis and administration of Phenergan for nausea and vomiting      Medication List    STOP taking these medications   sertraline 50 MG tablet Commonly known as:  ZOLOFT     TAKE these medications   acetaminophen 325 MG tablet Commonly known as:  TYLENOL Take 650 mg by mouth every 6 (six) hours as needed for fever or headache (pain). Do not exceed 3000 mg in 24 hours   amLODipine 10 MG tablet Commonly known as:  NORVASC Take 10 mg by mouth every evening. Hold for SBP <100MM/HG   bisacodyl 10 MG suppository Commonly known as:  DULCOLAX Place 10 mg rectally daily as needed for moderate constipation (constipation not  relieved by MOM).   calcium acetate 667 MG capsule Commonly known as:  PHOSLO Take 1,334 mg by mouth 3 (three) times daily with meals.   cloNIDine 0.1 MG tablet Commonly known as:  CATAPRES Take 0.1 mg by mouth every 8 (eight) hours as needed (sbp >180).   hydrocortisone cream 1 % Apply 1 application topically daily as needed for itching.   isosorbide mononitrate 60 MG 24 hr tablet Commonly known as:  IMDUR Take 60 mg by mouth daily.   lamoTRIgine 25 MG tablet Commonly known as:  LAMICTAL Take 50 mg by mouth daily.   latanoprost 0.005 % ophthalmic solution Commonly known as:  XALATAN Place 1 drop into both eyes at bedtime.   loperamide 2 MG tablet Commonly known as:  IMODIUM A-D Take 4 mg by mouth daily as needed (on Dialysis days).   LORazepam 0.5 MG tablet Commonly known as:  ATIVAN Take 1 tablet (0.5 mg total) by mouth at bedtime.   losartan 50 MG tablet Commonly known as:  COZAAR Take 50 mg by mouth every evening.   multivitamin Tabs tablet Take 1 tablet by mouth daily.   RA SALINE ENEMA 19-7 GM/118ML Enem Place 1 each rectally as needed (for constipation).   ranitidine 300 MG tablet Commonly known as:  ZANTAC Take 300 mg by mouth at bedtime. Reported on 03/05/2016   sevelamer carbonate 800 MG tablet Commonly known as:  RENVELA Take 3 tablets (2,400 mg total) by mouth 3 (three) times daily with meals.   simethicone 125 MG chewable tablet Commonly known as:  MYLICON Chew 409  mg by mouth daily. 6PM   valACYclovir 500 MG tablet Commonly known as:  VALTREX Take 1 tablet (500 mg total) by mouth every other day.        Brief H and P: For complete details please refer to admission H and P, but in brief77 year old female with history of multiple myeloma currently being followed by oncology [facility], dementia, ESRD, who presented with altered mental status and weakness. Patient was being worked up for possible CVA however CT and MRI showed no acute CVA. Patient  also had an EEG which was unremarkable for seizure. No source of infection as chest x-ray and UA are unremarkable. She is found to have accelerated hypertension, likely rebound from not getting her home medications.  Discussed with [facility] oncology as MRI showed multiple osseous metastasis/multiple myeloma. They recommended obtaining multiple myeloma work-up- results can be discussed with [name], NP or Dr. Amalia Hailey, [phone]. Nephrologyand neurologyconsulted for dialysis.   Hospital Course:   Acute metabolic encephalopathy, likely due to Hypertensive encephalopathy in the setting of dementia -Now alert and oriented, follow commands, appears close to her baseline -Patient had presented with accelerated hypertension.  BP 200/120 on admission.  Chest x-ray showed minimal vascular congestion, CT head was negative, sodium 134, albumin 2.5, neurology was consulted. -MRI of the brain showed no stroke, increased basal ganglia and thalami edema seen with hypertensive encephalopathy, moderate chronic small vessel ischemic changes, multiple osseous metastasis with multiple myeloma  -EEG showed mild to moderate generalized nonspecific cerebral dysfunction, no seizures -2D echo 11/27 had shown EF of 55 to 60% with grade 1 diastolic dysfunction -Per neurology no need for further stroke work-up, likely patient symptoms were due to hypertensive encephalopathy, recommended BP control. -Dr. Ree Kida discussed with [facility] oncology, MRI head showed multiple osseous metastasis/multiple myeloma, patient has been receiving chemo infusions at oncology [facility] -Has a history of recurrent bacteremia, blood cultures negative so far -UA negative for UTI, urine culture less than 10,000 colonies   History of multiple myeloma ([place]) -Followed by oncology [facility], receives chemo infusions. -If patient has another episode of bacteremia, will need to hold chemotherapy.    Hypertensive urgency -Improving with HD, continue  Imdur, clonidine, as needed with parameters for SBP above 180, amlodipine, losartan  Elevated troponin Likely due to demand ischemia and hypertensive emergency, continue heparin drip 2D echo 11/27 had shown EF of 55 to 60% with grade 1 diastolic dysfunction  ESRD on HD, TTS Nephrology was consulted, receiving HD per schedule   Day of Discharge S: Feels better asking if she will be going to skilled nursing facility, eating with assistance  BP (!) 159/104 (BP Location: Right Arm)   Pulse 60   Temp 98.2 F (36.8 C) (Oral)   Resp 20   Ht '5\' 3"'  (1.6 m)   Wt 55.5 kg   SpO2 100%   BMI 21.67 kg/m   Physical Exam: General: Alert and awake oriented, appears at her baseline HEENT: anicteric sclera, pupils reactive to light and accommodation CVS: S1-S2 clear no murmur rubs or gallops Chest: clear to auscultation bilaterally, no wheezing rales or rhonchi Abdomen: soft nontender, nondistended, normal bowel sounds Extremities: no cyanosis, clubbing or edema noted bilaterally Neuro: Cranial nerves II-XII intact, no focal neurological deficits   The results of significant diagnostics from this hospitalization (including imaging, microbiology, ancillary and laboratory) are listed below for reference.      Procedures/Studies:  Dg Chest 2 View  Result Date: 08/25/2018 CLINICAL DATA:  Altered mental status EXAM: CHEST -  2 VIEW COMPARISON:  Chest x-rays dated 08/12/2018 and 06/26/2017 FINDINGS: Stable cardiomegaly. Lungs are clear. No pleural effusion or pneumothorax seen. Osseous structures about the chest are unremarkable. IMPRESSION: 1. No active cardiopulmonary disease. No evidence of pneumonia or pulmonary edema. 2. Stable cardiomegaly. Electronically Signed   By: Franki Cabot M.D.   On: 08/25/2018 17:23   Ct Head Wo Contrast  Result Date: 08/25/2018 CLINICAL DATA:  New onset weakness after dialysis. Hypotension. Slurred speech. EXAM: CT HEAD WITHOUT CONTRAST TECHNIQUE: Contiguous  axial images were obtained from the base of the skull through the vertex without intravenous contrast. COMPARISON:  CT head without contrast 07/26/2018 FINDINGS: Brain: Moderate atrophy and white matter is similar to the prior exam. No acute infarct, hemorrhage, or mass lesion is present. Diffuse hypoattenuation is present throughout the basal ganglia. A remote lacunar infarct is again seen in the left cerebellum. Ventricles are proportionate to the degree of atrophy. No significant extra-axial fluid collection present. Vascular: Atherosclerotic calcifications are present within the cavernous internal carotid arteries bilaterally. There is no hyperdense vessel. Skull: Multiple lucent lesions are again noted. There is no significant interval change. Findings are consistent with known multiple myeloma. There is a focal lesion involving the left arch of C1. Sinuses/Orbits: The paranasal sinuses and mastoid air cells are clear. Globes and orbits are within normal limits. IMPRESSION: 1. Stable appearance of atrophy and white matter disease. 2. No acute intracranial abnormality. 3. Multiple lucent lesions in the calvarium and left-sided arch of C1 consistent with known multiple myeloma. There is no significant progression. Electronically Signed   By: San Morelle M.D.   On: 08/25/2018 17:22   Mr Brain Wo Contrast  Result Date: 09/16/2018 CLINICAL DATA:  Altered mental status and weakness. History of end-stage renal disease on dialysis, multiple myeloma, hypertension. EXAM: MRI HEAD WITHOUT CONTRAST TECHNIQUE: Multiplanar, multiecho pulse sequences of the brain and surrounding structures were obtained without intravenous contrast. COMPARISON:  CT HEAD September 16, 2018 and MRI head April 16, 2018 FINDINGS: INTRACRANIAL CONTENTS: No reduced diffusion to suggest acute ischemia. Numerous infratentorial to lesser extent supratentorial chronic microhemorrhages. Old small LEFT cerebellar infarct. Hazy T2 hyperintense  signal bilateral basal ganglia a increased from prior MRI. 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 Patchy supratentorial white matter FLAIR T2 hyperintensities. The ventricles and sulci are normal for patient's age. No suspicious parenchymal signal, masses, mass effect. No abnormal extra-axial fluid collections. No extra-axial masses. VASCULAR: Normal major intracranial vascular flow voids present at skull base. SKULL AND UPPER CERVICAL SPINE: No abnormal sellar expansion. Mildly reduced diffusion with low ADC values of numerous calvarial metastasis corresponding to known multiple myeloma. Heterogeneous calvarial and cervical spine bone marrow signal consistent with myeloproliferative disease. Craniocervical junction maintained. SINUSES/ORBITS: The mastoid air-cells and included paranasal sinuses are well-aerated.The included ocular globes and orbital contents are non-suspicious. OTHER: Patient is edentulous. IMPRESSION: 1. No acute infarct. 2. Increased basal ganglia and thalami edema seen with hypertensive encephalopathy, less likely rapidly progressing small vessel ischemic changes. 3. Moderate chronic small vessel ischemic changes. 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 4. Multiple osseous metastasis/multiple myeloma. Electronically Signed   By: Elon Alas M.D.   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 Daryll Brod,  MD Guidance:  Ultrasound and fluoroscopic FLUOROSCOPY TIME:  Fluoroscopy Time: 1 minutes 0 seconds (2 mGy). MEDICATIONS: Ancef 2 g administered within 1 hour of the procedure ANESTHESIA/SEDATION: Versed 0.5 mg IV; Fentanyl 25 mcg IV; Moderate  Sedation Time:  17 minutes The patient was continuously monitored during the procedure by the interventional radiology nurse under my direct supervision. CONTRAST:  None. COMPLICATIONS: None immediate. PROCEDURE: Informed consent was obtained from the patient following explanation of the procedure, risks, benefits and alternatives. The patient understands, agrees and consents for the procedure. All questions were addressed. A time out was performed. Maximal barrier sterile technique utilized including caps, mask, sterile gowns, sterile gloves, large sterile drape, hand hygiene, and 2% chlorhexidine scrub. Under sterile conditions and local anesthesia, right internal jugular micropuncture venous access was performed with ultrasound. Images were obtained for documentation. A guide wire was inserted followed by a transitional dilator. Next, a 0.035 guidewire was advanced into the IVC with a 5-French catheter. Measurements were obtained from the right venotomy site to the proximal right atrium. In the right infraclavicular chest, a subcutaneous tunnel was created under sterile conditions and local anesthesia. 1% lidocaine with epinephrine was utilized for this. The 19 cm tip to cuff palindrome catheter was tunneled subcutaneously to the venotomy site and inserted into the SVC/RA junction through a valved peel-away sheath. Position was confirmed with fluoroscopy. Images were obtained for documentation. Blood was aspirated from the catheter followed by saline and heparin flushes. The appropriate volume and strength of heparin was instilled in each lumen. Caps were applied. The catheter was secured at the tunnel site with Gelfoam and a pursestring suture. The venotomy site was closed with subcuticular Vicryl suture. Dermabond was applied to the small right neck incision. A dry sterile dressing was applied. The catheter is ready for use. No immediate complications. IMPRESSION: Ultrasound and fluoroscopically guided right  internal jugular tunneled hemodialysis catheter (19 cm tip to cuff palindrome catheter). Electronically Signed   By: Jerilynn Mages.  Shick M.D.   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 Daryll Brod, MD Guidance:  Ultrasound and fluoroscopic FLUOROSCOPY TIME:  Fluoroscopy Time: 1 minutes 0 seconds (2 mGy). MEDICATIONS: Ancef 2 g administered within 1 hour of the procedure ANESTHESIA/SEDATION: Versed 0.5 mg IV; Fentanyl 25 mcg IV; Moderate Sedation Time:  17 minutes The patient was continuously monitored during the procedure by the interventional radiology nurse under my direct supervision. CONTRAST:  None. COMPLICATIONS: None immediate. PROCEDURE: Informed consent was obtained from the patient following explanation of the procedure, risks, benefits and alternatives. The patient understands, agrees and consents for the procedure. All questions were addressed. A time out was performed. Maximal barrier sterile technique utilized including caps, mask, sterile gowns, sterile gloves, large sterile drape, hand hygiene, and 2% chlorhexidine scrub. Under sterile conditions and local anesthesia, right internal jugular micropuncture venous access was performed with ultrasound. Images were obtained for documentation. A guide wire was inserted followed by a transitional dilator. Next, a 0.035 guidewire was advanced into the IVC with a 5-French catheter. Measurements were obtained from the right venotomy site to the proximal right atrium. In the right infraclavicular chest, a subcutaneous tunnel was created under sterile conditions and local anesthesia. 1% lidocaine with epinephrine was utilized for  this. The 19 cm tip to  cuff palindrome catheter was tunneled subcutaneously to the venotomy site and inserted into the SVC/RA junction through a valved peel-away sheath. Position was confirmed with fluoroscopy. Images were obtained for documentation. Blood was aspirated from the catheter followed by saline and heparin flushes. The appropriate volume and strength of heparin was instilled in each lumen. Caps were applied. The catheter was secured at the tunnel site with Gelfoam and a pursestring suture. The venotomy site was closed with subcuticular Vicryl suture. Dermabond was applied to the small right neck incision. A dry sterile dressing was applied. The catheter is ready for use. No immediate complications. IMPRESSION: Ultrasound and fluoroscopically guided right internal jugular tunneled hemodialysis catheter (19 cm tip to cuff palindrome catheter). Electronically Signed   By: Jerilynn Mages.  Shick M.D.   On: 08/29/2018 09:47   Dg Chest Port 1 View  Result Date: 09/16/2018 CLINICAL DATA:  Altered mental status. EXAM: PORTABLE CHEST 1 VIEW COMPARISON:  Radiographs 08/25/2018 FINDINGS: Right internal jugular dialysis catheter tip in the distal SVC. Unchanged cardiomegaly. Unchanged aortic tortuosity. Minimal vascular congestion without pulmonary edema. No focal airspace disease, large pleural effusion or pneumothorax. No acute osseous abnormalities are seen. IMPRESSION: Stable cardiomegaly. Minimal vascular congestion. Electronically Signed   By: Keith Rake M.D.   On: 09/16/2018 01:57   Dg Colon W/cm - Wo/w Kub  Result Date: 09/01/2018 CLINICAL DATA:  GI bleed, anemia EXAM: BE WITH CONTRAST - WITHOUT AND WITH KUB CONTRAST:  Barium FLUOROSCOPY TIME:  Fluoroscopy Time:  2 minutes, 18 seconds Radiation Exposure Index (if provided by the fluoroscopic device): 32.9 mGy Number of Acquired Spot Images: 3 COMPARISON:  None. FINDINGS: The initial KUB demonstrates adequate prep, without a significant amount of stool in the colon. Vascular  calcifications noted. Probable scarring the lung bases. This patient was confused and has limited mobility. We were able to turn her prone and in the left lateral decubitus and right lateral decubitus positions. Turning her supine was not realistic. Some of our image sequences have the patient's arm across her belly, be reminded the patient multiple times to please move her arm by her sides. We were not able to turn up the patient into a full lateral projection. I used balloon compression in order to flatten out portions of the colon for better sensitivity. However, given the patient's difficulty in cooperating or turning, today's exam must be understood to have a reduced diagnostic sensitivity and specificity compared to a normal exam in a patient able to cooperate The barium column was advanced to tell barium into the terminal ileum. Upon compression fluoroscopy, scattered diverticula of the colon were observed without a concentrated appearance of diverticulosis. Patient was turned into decubitus positions in order to better characterize the bowel. There is some redundancy of the colon in the vicinity of the splenic flexure making it difficult to separate the loops of bowel. We were able to reflux the terminal ileum, which appears unremarkable. No appreciable colon mass is identified. No visualized filling defect in the barium column of the colon to favor a significant polyp. A post evacuation image of the abdomen demonstrates no appreciable filling defect, and shows filling of the colon. IMPRESSION: 1. No mass or significant polyp is identified. Please note that the patient had some confusion and limited mobility which does mildly reduced diagnostic sensitivity and specificity. 2. Scattered colonic diverticula. Electronically Signed   By: Van Clines M.D.   On: 09/01/2018 16:29   Ir Dialy Shunt Intro  Needle/intracath Initial W/img Left  Result Date: 08/29/2018 INDICATION: End-stage renal disease,  occluded left upper arm AV fistula EXAM: Left AV fistulogram (limited) MEDICATIONS: None. ANESTHESIA/SEDATION: Moderate Sedation Time:  None. The patient was continuously monitored during the procedure by the interventional radiology nurse under my direct supervision. FLUOROSCOPY TIME:  Fluoroscopy Time: 1 minutes 0 seconds (2 mGy). COMPLICATIONS: None immediate. PROCEDURE: Informed written consent was obtained from the patient after a thorough discussion of the procedural risks, benefits and alternatives. All questions were addressed. Maximal Sterile Barrier Technique was utilized including caps, mask, sterile gowns, sterile gloves, sterile drape, hand hygiene and skin antiseptic. A timeout was performed prior to the initiation of the procedure. Under sterile conditions, Angiocath access performed of the left upper arm AV fistula. Limited contrast injection for fistulagram performed. Left upper arm fistulagram: This demonstrates near complete thrombotic occlusion of the left upper arm brachiocephalic fistula. 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 By ultrasound, there is a large amount of thrombus burden throughout the entire left upper arm aneurysmal fistula extending to the axilla. Therefore, any aggressive intervention would be low yield for long term durability and also high risk for pulmonary embolism. 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 Large degree of thrombus burden throughout the fistula. See above comment. ACCESS: Because of the above findings, a tunneled right IJ dialysis catheter will be inserted. These results were called by telephone at the time of interpretation on 08/29/2018 at 9:51 am to Dr. Roney Jaffe , who verbally acknowledged these results. Electronically Signed   By: Jerilynn Mages.  Shick M.D.   On: 08/29/2018 09:51   Ct Head Code Stroke Wo Contrast  Result Date: 09/16/2018 CLINICAL  DATA:  Code stroke. RIGHT facial droop. Aphasic. History of multiple myeloma, hypertension. EXAM: CT HEAD WITHOUT CONTRAST TECHNIQUE: Contiguous axial images were obtained from the base of the skull through the vertex without intravenous contrast. COMPARISON:  CT HEAD August 25, 2018 and MRI head April 16, 2016 FINDINGS: Mild motion degraded examination. BRAIN: No intraparenchymal hemorrhage, mass effect nor midline shift. The ventricles and sulci are normal for age. 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 Old small LEFT cerebellar infarct. Patchy supratentorial white matter hypodensities. No acute large vascular territory infarcts. No abnormal extra-axial fluid collections. Basal cisterns are patent. VASCULAR: Mild calcific atherosclerosis of the carotid siphons. SKULL: No skull fracture. Multiple soft tissue masses within the calvarium including LEFT sphenoid eroding the inner and outer tables. No significant scalp soft tissue swelling. SINUSES/ORBITS: Mild paranasal sinus mucosal thickening. Mastoid air cells are well aerated. Soft tissue within the external auditory canals most compatible with cerumen.The included ocular globes and orbital contents are non-suspicious. OTHER: None. ASPECTS [facility] Stroke Program Early CT Score) - Ganglionic level infarction (caudate, lentiform nuclei, internal capsule, insula, M1-M3 cortex): 7 - Supraganglionic infarction (M4-M6 cortex): 3 Total score (0-10 with 10 being normal): 10 IMPRESSION: 1. Mild motion degraded examination.  No acute intracranial process. 2. ASPECTS is 10. 3. Mild chronic small vessel ischemic changes. 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 4. Multiple osseous lesions consistent with known multiple myeloma. 5. Critical Value/emergent results text paged to [place] via [employer] secure system on 09/16/2018 at 12:18 am, including interpreting physician's phone number. Electronically Signed   By: Elon Alas  M.D.   On: 09/16/2018 00:18      LAB RESULTS: Basic Metabolic Panel: Recent Labs  Lab 09/16/18 0028  09/17/18 0451 09/18/18 0645  NA  --    < > 132* 128*  K  --    < > 3.7 3.6  CL  --    < > 96* 93*  CO2  --    < > 23 27  GLUCOSE  --    < > 81 79  BUN  --    < > 11 22  CREATININE  --    < > 3.74* 5.33*  CALCIUM  --    < > 8.4* 8.9  MG 2.8*  --   --   --   PHOS  --    < > 2.1* 2.6   < > = values in this interval not displayed.   Liver Function Tests: Recent Labs  Lab 09/16/18 0008 09/17/18 0451 09/18/18 0645  AST 25  --   --   ALT 9  --   --   ALKPHOS 51  --   --   BILITOT 0.8  --   --   PROT 9.4*  --   --   ALBUMIN 2.5* 2.4* 2.1*   No results for input(s): LIPASE, AMYLASE in the last 168 hours. No results for input(s): AMMONIA in the last 168 hours. CBC: Recent Labs  Lab 09/16/18 0008  09/18/18 0513 09/19/18 0508  WBC 6.5   < > 4.1 3.6*  NEUTROABS 4.9  --   --   --   HGB 9.5*   < > 8.3* 8.3*  HCT 31.9*   < > 27.2* 27.9*  MCV 99.4   < > 99.6 100.0  PLT 118*   < > 128* 143*   < > = values in this interval not displayed.   Cardiac Enzymes: Recent Labs  Lab 09/16/18 0956 09/16/18 1632  TROPONINI 0.22* 0.15*   BNP: Invalid input(s): POCBNP CBG: Recent Labs  Lab 09/16/18 1131 09/18/18 1239  GLUCAP 93 81      Disposition and Follow-up: Discharge Instructions    Diet - low sodium heart healthy   Complete by:  As directed    Increase activity slowly   Complete by:  As directed        DISPOSITION: To nursing facility   [facility]ude    Hendricks Limes, MD. Schedule an appointment as soon as possible for a visit in 2 week(s).   Specialty:  Internal Medicine Contact information: [place] [address] [phone]            Time coordinating discharge:  35 minutes  Signed:   Estill Cotta M.D. [facility] 09/19/2018, 1:06 PM Pager: [phone]

## 2018-09-21 LAB — CULTURE, BLOOD (ROUTINE X 2)
Culture: NO GROWTH
Culture: NO GROWTH
Special Requests: ADEQUATE

## 2018-09-22 ENCOUNTER — Encounter: Payer: Self-pay | Admitting: Internal Medicine

## 2018-09-22 ENCOUNTER — Non-Acute Institutional Stay (SKILLED_NURSING_FACILITY): Payer: Medicare Other | Admitting: Internal Medicine

## 2018-09-22 DIAGNOSIS — R509 Fever, unspecified: Secondary | ICD-10-CM

## 2018-09-22 DIAGNOSIS — I10 Essential (primary) hypertension: Secondary | ICD-10-CM

## 2018-09-22 DIAGNOSIS — G9340 Encephalopathy, unspecified: Secondary | ICD-10-CM | POA: Diagnosis not present

## 2018-09-22 NOTE — Progress Notes (Signed)
NURSING HOME LOCATION:  [facility] ROOM NUMBER:  [id number]  CODE STATUS:  Full Code  PCP:  Hendricks Limes, MD  [place] [id number]  This is a [facility] readmission within 30 days  Interim medical record and care since last [facility] visit was updated with review of diagnostic studies and change in clinical status since last visit were documented.  HPI: The patient was rehospitalized 12/17-12/20/2019 presenting from the SNF with altered mental status and weakness.  CT and MRI revealed no acute CVA.  EEG was unremarkable for seizure activity.  Neurology felt no other evaluation was indicated.  There was no evidence of infection based on chest x-ray imaging and urinalysis.  She has a history of recurrent bacteremia, but blood cultures were negative.  Urine culture revealed less than 10,000 colonies.  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  Blood pressure was 200/120 on admission. Patient has multiple myeloma and is being followed by [facility]. 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 Nephrology was consulted for inpatient dialysis. She was stabilized and discharged to the SNF.  Originally I tried to see the patient yesterday 12/23 but she had gone to dialysis which encompasses most of the day.  Her schedule was changed because of the holidays. Today she appears encephalopathic and could not give me the date beyond the month.  She had no clue that it was Christmas Eve. She has had encephalopathy in the past when she refuses to go to hemodialysis ; when she had accelerated hypertension; and on those occasions when bacteremia has been documented. Review of microbiology data in Epic demonstrated viridans Streptococcus urinary tract infection on 04/16/2018.  Most recently she has had blood cultures positive for Achromobacter species 11/12, 11/25, and 11/28.  Dr.Van Dam had recommended Zosyn therapy when she was seen in consult 11/28.  Review  of systems: Review of systems was positive for dyspnea on exertion when she ambulates in the wheelchair. She maintains that she propels herself although I have never seen this to be the case.   Apparently a fall was reported this am which she denies.The [facility] NP had asked her about a fall and she said that she had simply "slipped down". At this time she has the speech pattern she exhibits with encephalopathy.  Her sentences are agitated and terse and delivered with a wide-eyed stare. She was tearful stating that "I cannot make do-do; it is hard".  She denied other GI symptoms.   Speech therapy has been following the patient for dysphagia and  prescribed pure diet.  No chest pain, palpitations, paroxysmal nocturnal dyspnea, claudication, edema  No cough, sputum production No heartburn, dysphagia, abdominal pain, nausea /vomiting, rectal bleeding, melena No dysuria, hematuria, pyuria, incontinence, nocturia as anuric from ESRD.  Physical exam:  Pertinent or positive findings: Because of this encephalopathic pattern and suggestion of elevated skin temperature to touch, I requested repeat vital signs.  Temperature was 100.3; O2 sats 95% on room air; pulse 68; blood pressure 183/98. Her affect which is 1 mainly of agitation as noted above under ROS.  Initially she had the right leg flexed under the left and stated she could not move it.  It was weak to opposition but when I straightened the leg ;she was able to move the leg voluntarily.  Some remnants of the pured diet present intraorally.  She is wearing only the upper plate.  She has a grade 1.5 systolic murmur at the base.  The second heart sound is increased.  There is slight asymmetry of the breath sounds with minimal decrease on the right but no abnormal breath sounds.  As noted she was weak to opposition but this was especially so in the right lower extremity.  Posterior tibial pulses were decreased.  Skin felt warm to touch.  General appearance:   no acute distress, increased work of breathing is present.   Lymphatic: No lymphadenopathy about the head, neck, axilla. Eyes: No conjunctival inflammation or lid edema is present. There is no scleral icterus. Ears:  External ear exam shows no significant lesions or deformities.   Nose:  External nasal examination shows no deformity or inflammation. Nasal mucosa are pink and moist without lesions, exudates Oral exam:  Lips and gums are healthy appearing. There is no oropharyngeal erythema or exudate. Neck:  No thyromegaly, masses, tenderness noted.    Heart:  Normal rate and regular rhythm. S1 normal without gallop, click, rub .  Lungs: Chest clear to auscultation without wheezes, rhonchi, rales, rubs. Abdomen: Bowel sounds are normal. Abdomen is soft and nontender with no organomegaly, hernias, masses.  No palpable stool load. GU: Deferred  Extremities:  No cyanosis, clubbing, edema  Skin: Warm as noted & dry  No significant lesions or rash.  See summary under each active problem in the Problem List and acutely for this visit with associated  therapeutic plan

## 2018-09-23 ENCOUNTER — Encounter: Payer: Self-pay | Admitting: Internal Medicine

## 2018-09-23 LAB — MULTIPLE MYELOMA PANEL, SERUM
ALPHA2 GLOB SERPL ELPH-MCNC: 0.5 g/dL (ref 0.4–1.0)
Albumin SerPl Elph-Mcnc: 3.6 g/dL (ref 2.9–4.4)
Albumin/Glob SerPl: 0.7 (ref 0.7–1.7)
Alpha 1: 0.4 g/dL (ref 0.0–0.4)
B-Globulin SerPl Elph-Mcnc: 0.6 g/dL — ABNORMAL LOW (ref 0.7–1.3)
GAMMA GLOB SERPL ELPH-MCNC: 4 g/dL — AB (ref 0.4–1.8)
GLOBULIN, TOTAL: 5.4 g/dL — AB (ref 2.2–3.9)
IGG (IMMUNOGLOBIN G), SERUM: 5717 mg/dL — AB (ref 700–1600)
IgA: <5 mg/dL — ABNORMAL LOW (ref 64–422)
IgM (Immunoglobulin M), Srm: 13 mg/dL — ABNORMAL LOW (ref 26–217)
M Protein SerPl Elph-Mcnc: 3.7 g/dL — ABNORMAL HIGH
Total Protein ELP: 9 g/dL — ABNORMAL HIGH (ref 6.0–8.5)

## 2018-09-23 NOTE — Assessment & Plan Note (Signed)
Accelerated hypertension is present but with a temperature of 103.2 in the context of multiple myeloma and Immunosuppression, recurrent bacteremia suspect risk for aspiration auscultation did not reveal abnormal breath sounds.  Zosyn based on end-stage renal disease parameters will be initiated.

## 2018-09-23 NOTE — Assessment & Plan Note (Signed)
09/23/2018 Catapres-TTS 0.2 patch ordered today with reapplication in 1 week if blood pressure remains elevated

## 2018-09-23 NOTE — Patient Instructions (Addendum)
See assessment and plan under each diagnosis in the problem list and acutely for this visit Total time 42 minutes; greater than 50% of the visit spent counseling patient and coordinating care for problems addressed at this encounter

## 2018-09-25 ENCOUNTER — Encounter: Payer: Self-pay | Admitting: Adult Health

## 2018-09-25 ENCOUNTER — Non-Acute Institutional Stay (SKILLED_NURSING_FACILITY): Payer: Medicare Other | Admitting: Adult Health

## 2018-09-25 DIAGNOSIS — I10 Essential (primary) hypertension: Secondary | ICD-10-CM

## 2018-09-25 DIAGNOSIS — G9340 Encephalopathy, unspecified: Secondary | ICD-10-CM

## 2018-09-25 DIAGNOSIS — K5901 Slow transit constipation: Secondary | ICD-10-CM | POA: Diagnosis not present

## 2018-09-25 NOTE — Progress Notes (Signed)
Location:  [facility] Room Number: 109A Place of Service:  SNF (31) Provider:  Durenda Age, NP  Patient Care Team: Hendricks Limes, MD as PCP - General (Internal Medicine) Medina-Vargas, Senaida Lange, NP as Nurse Practitioner (Internal Medicine)  Extended Emergency Contact Information Primary Emergency Contact: Wilburn Mylar [place] of [place] Phone: [phone] Mobile Phone: [phone] Relation: Daughter Secondary Emergency Contact: Odie Sera [place] of [place] Mobile Phone: [phone] Relation: Brother  Code Status:  Full Code  Goals of care: Advanced Directive information Advanced Directives 09/16/2018  Does Patient Have a Medical Advance Directive? No  Type of Advance Directive -  Does patient want to make changes to medical advance directive? -  Copy of [place] in Chart? -  Would patient like information on creating a medical advance directive? No - Patient declined     Chief Complaint  Patient presents with  . Acute Visit    Follow-up     HPI:  Pt is a 77 y.o. female seen today to follow up encephalopathy. She was seen in her room today. She is alert and oriented. She was reported to have been playing with her feces while on the floor this morning. She complained of abdominal pain and was ordered to have KUB. Zosyn ordered on 09/23/18 has not been given as of today. No peripheral IV was noted. Resident agreed to have PICC line if needed. No reported fever yesterday.  She had a fever on 12/23 with 103.2 in the context of multiple myeloma and immunosuppression, recurrent bacteremia, suspect risk for aspiration.  She denies having cough.  Latest BP 164/90.  She was recently started [facility] TTS 0.2 patch due to accelerated hypertension.   She was recently re-admitted to [facility] on 09/19/18 after a recent hospitalization due to altered mental status and weakness.  CT and MRI  revealed no acute CVA.  EEG was unremarkable for seizure activity.  Neurology felt that no further evaluation was indicated.   Past Medical History:  Diagnosis Date  . Anemia    from dx list from [place]  . Anxiety   . Closed fracture of right distal femur ([place]) 09/01/2015  . Depression   . ESRD (end stage renal disease) on dialysis [facility])    "TTS; [facility]" (04/24/2017)  . GERD (gastroesophageal reflux disease) 04/23/2015  . HCAP (healthcare-associated pneumonia) 04/24/2017  . Heart murmur   . History of blood transfusion    "low HgB when I was going to dialysis center"  . Hypertension   . Hypertension associated with end stage renal disease on dialysis 03/11/2014  . Malnutrition of moderate degree 09/02/2015  . Multiple myeloma ([place]) 03/11/2014  . Patient is Jehovah's Witness    "I'd rather never have any blood transfusions unless absolutely necessary; please check with me 1st". (04/24/2017)  . Pneumonia ~ 2016  . Transfusion history    08/25/2018 hemoglobin 7.1,+ FOB, status post 2 units packed cells   Past Surgical History:  Procedure Laterality Date  . AV FISTULA PLACEMENT Left   . I&D EXTREMITY Left 02/27/2016   Procedure: ARTHROSCOPIC IRRIGATION AND DEBRIDEMENT EXTREMITY;  Surgeon: Renette Butters, MD;  Location: [facility];  Service: Orthopedics;  Laterality: Left;  . IR DIALY SHUNT INTRO NEEDLE/INTRACATH INITIAL W/IMG LEFT Left 08/29/2018  . IR FLUORO GUIDE CV LINE RIGHT  08/29/2018  . IR US GUIDE VASC ACCESS RIGHT  08/29/2018  . TEE WITHOUT CARDIOVERSION N/A 02/29/2016   Procedure: TRANSESOPHAGEAL ECHOCARDIOGRAM (  TEE);  Surgeon: Thayer Headings, MD;  Location: [facility];  Service: Cardiovascular;  Laterality: N/A;    Allergies  Allergen Reactions  . Phenergan [Promethazine Hcl] Other (See Comments)    Acute encephalopathy in the context of refusal to go to hemodialysis and administration of Phenergan for nausea and vomiting    Outpatient Encounter Medications as of  09/25/2018  Medication Sig  . acetaminophen (TYLENOL) 325 MG tablet Take 650 mg by mouth every 6 (six) hours as needed for fever or headache (pain). Do not exceed 3000 mg in 24 hours  . amLODipine (NORVASC) 10 MG tablet Take 10 mg by mouth every evening. Hold for SBP <100MM/HG  . bisacodyl (DULCOLAX) 10 MG suppository Place 10 mg rectally daily as needed for moderate constipation (constipation not relieved by MOM).  . calcium acetate (PHOSLO) 667 MG capsule Take 1,334 mg by mouth 3 (three) times daily with meals.  . cloNIDine (CATAPRES) 0.1 MG tablet Take 0.1 mg by mouth every 8 (eight) hours as needed (sbp >180).   . hydrocortisone cream 1 % Apply 1 application topically daily as needed for itching.  . isosorbide mononitrate (IMDUR) 60 MG 24 hr tablet Take 60 mg by mouth daily.   Marland Kitchen lamoTRIgine (LAMICTAL) 25 MG tablet Take 50 mg by mouth daily. Take 2 tablets to = 50 mg  . latanoprost (XALATAN) 0.005 % ophthalmic solution Place 1 drop into both eyes at bedtime.   Marland Kitchen loperamide (IMODIUM A-D) 2 MG tablet Take 4 mg by mouth daily as needed (on Dialysis days).   . LORazepam (ATIVAN) 0.5 MG tablet Take 1 tablet (0.5 mg total) by mouth at bedtime.  Marland Kitchen losartan (COZAAR) 50 MG tablet Take 50 mg by mouth every evening.  . multivitamin (RENA-VIT) TABS tablet Take 1 tablet by mouth daily.  . ranitidine (ZANTAC) 300 MG tablet Take 300 mg by mouth at bedtime. Reported on 03/05/2016  . sevelamer carbonate (RENVELA) 800 MG tablet Take 3 tablets (2,400 mg total) by mouth 3 (three) times daily with meals.  . simethicone (MYLICON) 409 MG chewable tablet Chew 125 mg by mouth daily. 6PM  . Sodium Phosphates (RA SALINE ENEMA) 19-7 GM/118ML ENEM Place 1 each rectally as needed (for constipation).  . valACYclovir (VALTREX) 500 MG tablet Take 1 tablet (500 mg total) by mouth every other day.   No facility-administered encounter medications on file as of 09/25/2018.     Review of Systems  GENERAL: No change in  appetite, no fatigue, no weight changes, no fever, chills or weakness MOUTH and THROAT: Denies oral discomfort, gingival pain or bleeding, pain from teeth or hoarseness   RESPIRATORY: no cough, SOB, DOE, wheezing, hemoptysis CARDIAC: No chest pain, edema or palpitations GI: +abdominal pain NEUROLOGICAL: Denies dizziness, syncope, numbness, or headache PSYCHIATRIC: Denies feelings of depression or anxiety. No report of hallucinations, insomnia, paranoia, or agitation    Immunization History  Administered Date(s) Administered  . Influenza,inj,Quad PF,6+ Mos 07/23/2014, 08/01/2016  . Influenza,inj,quad, With Preservative 08/07/2017  . PPD Test 04/13/2015  . Pneumococcal Polysaccharide-23 07/18/2017  . Tdap 07/01/2018   Pertinent  Health Maintenance Due  Topic Date Due  . DEXA SCAN  11/28/2018 (Originally 03/20/2004)  . INFLUENZA VACCINE  08/02/2019 (Originally 05/01/2018)  . PNA vac Low Risk Adult (2 of 2 - PCV13) 09/04/2019 (Originally 07/18/2018)   Fall Risk  06/26/2018 06/06/2017 11/09/2016 06/15/2016 05/25/2016  Falls in the past year? Yes No No No No  Number falls in past yr: 2 or more - - - -  Injury with Fall? No - - - -  Risk Factor Category  - - - - -  Risk for fall due to : - - - - -  Risk for fall due to: Comment - - - - -  Follow up - - - - -   Functional Status Survey:    Vitals:   09/25/18 1537  BP: (!) 164/90  Pulse: 61  Resp: 18  Temp: 99.6 F (37.6 C)  Weight: 131 lb 4.8 oz (59.6 kg)  Height: '5\' 6"'$  (1.676 m)   Body mass index is 21.19 kg/m.  Physical Exam  GENERAL APPEARANCE: Well nourished. In no acute distress. Normal body habitus SKIN:  Skin is warm and dry.  MOUTH and THROAT: Lips are without lesions. Oral mucosa is moist and without lesions.  RESPIRATORY: Breathing is even & unlabored, BS CTAB CARDIAC: RRR, no murmur,no extra heart sounds, no edema, right chest dialysis catheter GI: Abdomen soft, normal BS, no masses, no tenderness EXTREMITIES:  Able  to move X 4 extremities  NEUROLOGICAL: There is no tremor. Speech is clear. Alert and oriented X 3. PSYCHIATRIC:  Affect and behavior are appropriate   Labs reviewed: Recent Labs    05/29/18 0526  08/30/18 0806  09/16/18 0028 09/16/18 0414 09/17/18 0451 09/18/18 0645  NA 136   < > 125*   < >  --  133* 132* 128*  K 3.4*   < > 4.4   < >  --  5.0 3.7 3.6  CL 99   < > 89*   < >  --  93* 96* 93*  CO2 27   < > 25   < >  --  '28 23 27  '$ GLUCOSE 74   < > 115*   < >  --  81 81 79  BUN 19   < > 31*   < >  --  28* 11 22  CREATININE 5.95*   < > 8.42*   < >  --  6.72* 3.74* 5.33*  CALCIUM 9.2   < > 8.4*   < >  --  9.5 8.4* 8.9  MG 2.1  --   --   --  2.8*  --   --   --   PHOS 2.9   < > 5.4*  --   --   --  2.1* 2.6   < > = values in this interval not displayed.   Recent Labs    08/25/18 1607 08/28/18 0409  09/16/18 0008 09/17/18 0451 09/18/18 0645  AST 16 15  --  25  --   --   ALT 8 7  --  9  --   --   ALKPHOS 44 42  --  51  --   --   BILITOT 0.8 0.7  --  0.8  --   --   PROT 8.2* 7.4  --  9.4*  --   --   ALBUMIN 2.7* 2.3*   < > 2.5* 2.4* 2.1*   < > = values in this interval not displayed.   Recent Labs    08/25/18 1607 08/25/18 2301  09/16/18 0008  09/17/18 0451 09/18/18 0513 09/19/18 0508  WBC 3.9* 3.7*   < > 6.5  --  4.7 4.1 3.6*  NEUTROABS 2.9 2.6  --  4.9  --   --   --   --   HGB 7.1* 7.1*   < > 9.5*   < > 9.9* 8.3* 8.3*  HCT 23.6* 23.3*   < > 31.9*   < > 33.2* 27.2* 27.9*  MCV 100.4* 99.6   < > 99.4  --  99.1 99.6 100.0  PLT 234 228   < > 118*  --  108* 128* 143*   < > = values in this interval not displayed.   Lab Results  Component Value Date   TSH 2.491 09/16/2018   Lab Results  Component Value Date   HGBA1C 4.6 (L) 09/16/2018   Lab Results  Component Value Date   CHOL 135 09/16/2018   HDL 60 09/16/2018   LDLCALC 57 09/16/2018   TRIG 90 09/16/2018   CHOLHDL 2.3 09/16/2018    Significant Diagnostic Results in last 30 days:  Mr Brain Wo  Contrast  Result Date: 09/16/2018 CLINICAL DATA:  Altered mental status and weakness. History of end-stage renal disease on dialysis, multiple myeloma, hypertension. EXAM: MRI HEAD WITHOUT CONTRAST TECHNIQUE: Multiplanar, multiecho pulse sequences of the brain and surrounding structures were obtained without intravenous contrast. COMPARISON:  CT HEAD September 16, 2018 and MRI head April 16, 2018 FINDINGS: INTRACRANIAL CONTENTS: No reduced diffusion to suggest acute ischemia. Numerous infratentorial to lesser extent supratentorial chronic microhemorrhages. Old small LEFT cerebellar infarct. Hazy T2 hyperintense signal bilateral basal ganglia a increased from prior MRI. 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 Patchy supratentorial white matter FLAIR T2 hyperintensities. The ventricles and sulci are normal for patient's age. No suspicious parenchymal signal, masses, mass effect. No abnormal extra-axial fluid collections. No extra-axial masses. VASCULAR: Normal major intracranial vascular flow voids present at skull base. SKULL AND UPPER CERVICAL SPINE: No abnormal sellar expansion. Mildly reduced diffusion with low ADC values of numerous calvarial metastasis corresponding to known multiple myeloma. Heterogeneous calvarial and cervical spine bone marrow signal consistent with myeloproliferative disease. Craniocervical junction maintained. SINUSES/ORBITS: The mastoid air-cells and included paranasal sinuses are well-aerated.The included ocular globes and orbital contents are non-suspicious. OTHER: Patient is edentulous. IMPRESSION: 1. No acute infarct. 2. Increased basal ganglia and thalami edema seen with hypertensive encephalopathy, less likely rapidly progressing small vessel ischemic changes. 3. Moderate chronic small vessel ischemic changes. 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 4. Multiple osseous metastasis/multiple myeloma. Electronically Signed   By: Elon Alas M.D.   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 Daryll Brod, MD Guidance:  Ultrasound and fluoroscopic FLUOROSCOPY TIME:  Fluoroscopy Time: 1 minutes 0 seconds (2 mGy). MEDICATIONS: Ancef 2 g administered within 1 hour of the procedure ANESTHESIA/SEDATION: Versed 0.5 mg IV; Fentanyl 25 mcg IV; Moderate Sedation Time:  17 minutes The patient was continuously monitored during the procedure by the interventional radiology nurse under my direct supervision. CONTRAST:  None. COMPLICATIONS: None immediate. PROCEDURE: Informed consent was obtained from the patient following explanation of the procedure, risks, benefits and alternatives. The patient understands, agrees and consents for the procedure. All questions were addressed. A time out was performed. Maximal barrier sterile technique utilized including caps, mask, sterile gowns, sterile gloves, large sterile drape, hand hygiene, and 2% chlorhexidine scrub. Under sterile conditions and local anesthesia, right internal jugular micropuncture venous access was performed with ultrasound. Images were obtained for documentation. A guide wire was inserted followed by a transitional dilator. Next, a 0.035  guidewire was advanced into the IVC with a 5-French catheter. Measurements were obtained from the right venotomy site to the proximal right atrium. In the right infraclavicular chest, a subcutaneous tunnel was created under sterile conditions and local anesthesia. 1% lidocaine with epinephrine was utilized for this. The 19 cm tip to cuff palindrome catheter was tunneled subcutaneously to the venotomy site and inserted into the SVC/RA junction through a valved peel-away  sheath. Position was confirmed with fluoroscopy. Images were obtained for documentation. Blood was aspirated from the catheter followed by saline and heparin flushes. The appropriate volume and strength of heparin was instilled in each lumen. Caps were applied. The catheter was secured at the tunnel site with Gelfoam and a pursestring suture. The venotomy site was closed with subcuticular Vicryl suture. Dermabond was applied to the small right neck incision. A dry sterile dressing was applied. The catheter is ready for use. No immediate complications. IMPRESSION: Ultrasound and fluoroscopically guided right internal jugular tunneled hemodialysis catheter (19 cm tip to cuff palindrome catheter). Electronically Signed   By: Jerilynn Mages.  Shick M.D.   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 Daryll Brod, MD Guidance:  Ultrasound and fluoroscopic FLUOROSCOPY TIME:  Fluoroscopy Time: 1 minutes 0 seconds (2 mGy). MEDICATIONS: Ancef 2 g administered within 1 hour of the procedure ANESTHESIA/SEDATION: Versed 0.5 mg IV; Fentanyl 25 mcg IV; Moderate Sedation Time:  17 minutes The patient was continuously monitored during the procedure by the interventional radiology nurse under my direct supervision. CONTRAST:  None. COMPLICATIONS: None immediate. PROCEDURE: Informed consent was obtained from the patient following explanation of the procedure, risks, benefits and alternatives. The patient understands, agrees and consents for the procedure. All questions were addressed. A time out was performed. Maximal barrier sterile technique utilized including caps, mask, sterile gowns, sterile gloves, large sterile drape, hand  hygiene, and 2% chlorhexidine scrub. Under sterile conditions and local anesthesia, right internal jugular micropuncture venous access was performed with ultrasound. Images were obtained for documentation. A guide wire was inserted followed by a transitional dilator. Next, a 0.035 guidewire was advanced into the IVC with a 5-French catheter. Measurements were obtained from the right venotomy site to the proximal right atrium. In the right infraclavicular chest, a subcutaneous tunnel was created under sterile conditions and local anesthesia. 1% lidocaine with epinephrine was utilized for this. The 19 cm tip to cuff palindrome catheter was tunneled subcutaneously to the venotomy site and inserted into the SVC/RA junction through a valved peel-away sheath. Position was confirmed with fluoroscopy. Images were obtained for documentation. Blood was aspirated from the catheter followed by saline and heparin flushes. The appropriate volume and strength of heparin was instilled in each lumen. Caps were applied. The catheter was secured at the tunnel site with Gelfoam and a pursestring suture. The venotomy site was closed with subcuticular Vicryl suture. Dermabond was applied to the small right neck incision. A dry sterile dressing was applied. The catheter is ready for use. No immediate complications. IMPRESSION: Ultrasound and fluoroscopically guided right internal jugular tunneled hemodialysis catheter (19 cm tip to cuff palindrome catheter). Electronically Signed   By: Jerilynn Mages.  Shick M.D.   On: 08/29/2018 09:47   Dg Chest Port 1 View  Result Date: 09/16/2018 CLINICAL DATA:  Altered mental status. EXAM: PORTABLE CHEST 1 VIEW COMPARISON:  Radiographs 08/25/2018 FINDINGS: Right internal jugular dialysis catheter tip in the distal SVC. Unchanged cardiomegaly. Unchanged aortic tortuosity. Minimal vascular congestion without pulmonary edema. No focal airspace disease, large pleural effusion or pneumothorax. No acute osseous  abnormalities are seen. IMPRESSION: Stable cardiomegaly. Minimal vascular congestion. Electronically Signed   By: Keith Rake M.D.   On: 09/16/2018 01:57   Dg Colon W/cm - Wo/w Kub  Result Date: 09/01/2018 CLINICAL DATA:  GI bleed, anemia EXAM: BE WITH CONTRAST - WITHOUT AND WITH KUB CONTRAST:  Barium FLUOROSCOPY TIME:  Fluoroscopy Time:  2 minutes, 18 seconds Radiation Exposure Index (if provided by the fluoroscopic device): 32.9 mGy Number of Acquired Spot Images: 3 COMPARISON:  None. FINDINGS: The initial KUB demonstrates adequate prep, without a significant amount of stool in the colon. Vascular calcifications noted. Probable scarring the lung bases. This patient was confused and has limited mobility. We were able to turn her prone and in the left lateral decubitus and right lateral decubitus positions. Turning her supine was not realistic. Some of our image sequences have the patient's arm across her belly, be reminded the patient multiple times to please move her arm by her sides. We were not able to turn up the patient into a full lateral projection. I used balloon compression in order to flatten out portions of the colon for better sensitivity. However, given the patient's difficulty in cooperating or turning, today's exam must be understood to have a reduced diagnostic sensitivity and specificity compared to a normal exam in a patient able to cooperate The barium column was advanced to tell barium into the terminal ileum. Upon compression fluoroscopy, scattered diverticula of the colon were observed without a concentrated appearance of diverticulosis. Patient was turned into decubitus positions in order to better characterize the bowel. There is some redundancy of the colon in the vicinity of the splenic flexure making it difficult to separate the loops of bowel. We were able to reflux the terminal ileum, which appears unremarkable. No appreciable colon mass is identified. No visualized filling  defect in the barium column of the colon to favor a significant polyp. A post evacuation image of the abdomen demonstrates no appreciable filling defect, and shows filling of the colon. IMPRESSION: 1. No mass or significant polyp is identified. Please note that the patient had some confusion and limited mobility which does mildly reduced diagnostic sensitivity and specificity. 2. Scattered colonic diverticula. Electronically Signed   By: Van Clines M.D.   On: 09/01/2018 16:29   Ir Dialy Shunt Intro Needle/intracath Initial W/img Left  Result Date: 08/29/2018 INDICATION: End-stage renal disease, occluded left upper arm AV fistula EXAM: Left AV fistulogram (limited) MEDICATIONS: None. ANESTHESIA/SEDATION: Moderate Sedation Time:  None. The patient was continuously monitored during the procedure by the interventional radiology nurse under my direct supervision. FLUOROSCOPY TIME:  Fluoroscopy Time: 1 minutes 0 seconds (2 mGy). COMPLICATIONS: None immediate. PROCEDURE: Informed written consent was obtained from the patient after a thorough discussion of the procedural risks, benefits and alternatives. All questions were addressed. Maximal Sterile Barrier Technique was utilized including caps, mask, sterile gowns, sterile gloves, sterile drape, hand hygiene and skin antiseptic. A timeout was performed prior to the initiation of the procedure. Under sterile conditions, Angiocath access performed of the left upper arm AV fistula. Limited contrast injection for fistulagram performed. Left upper arm fistulagram: This demonstrates near complete thrombotic occlusion of the left upper arm brachiocephalic fistula. The left upper arm  AV fistula is very old, aneurysmally dilated, and tortuous in the upper arm. By ultrasound, there is a large amount of thrombus burden throughout the entire left upper arm aneurysmal fistula extending to the axilla. Therefore, any aggressive intervention would be low yield for long term  durability and also high risk for pulmonary embolism. 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 Large degree of thrombus burden throughout the fistula. See above comment. ACCESS: Because of the above findings, a tunneled right IJ dialysis catheter will be inserted. These results were called by telephone at the time of interpretation on 08/29/2018 at 9:51 am to Dr. Roney Jaffe , who verbally acknowledged these results. Electronically Signed   By: Jerilynn Mages.  Shick M.D.   On: 08/29/2018 09:51   Ct Head Code Stroke Wo Contrast  Result Date: 09/16/2018 CLINICAL DATA:  Code stroke. RIGHT facial droop. Aphasic. History of multiple myeloma, hypertension. EXAM: CT HEAD WITHOUT CONTRAST TECHNIQUE: Contiguous axial images were obtained from the base of the skull through the vertex without intravenous contrast. COMPARISON:  CT HEAD August 25, 2018 and MRI head April 16, 2016 FINDINGS: Mild motion degraded examination. BRAIN: No intraparenchymal hemorrhage, mass effect nor midline shift. The ventricles and sulci are normal for age. 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 Old small LEFT cerebellar infarct. Patchy supratentorial white matter hypodensities. No acute large vascular territory infarcts. No abnormal extra-axial fluid collections. Basal cisterns are patent. VASCULAR: Mild calcific atherosclerosis of the carotid siphons. SKULL: No skull fracture. Multiple soft tissue masses within the calvarium including LEFT sphenoid eroding the inner and outer tables. No significant scalp soft tissue swelling. SINUSES/ORBITS: Mild paranasal sinus mucosal thickening. Mastoid air cells are well aerated. Soft tissue within the external auditory canals most compatible with cerumen.The included ocular globes and orbital contents are non-suspicious. OTHER: None. ASPECTS [facility] Stroke Program Early CT Score) - Ganglionic level infarction (caudate, lentiform nuclei,  internal capsule, insula, M1-M3 cortex): 7 - Supraganglionic infarction (M4-M6 cortex): 3 Total score (0-10 with 10 being normal): 10 IMPRESSION: 1. Mild motion degraded examination.  No acute intracranial process. 2. ASPECTS is 10. 3. Mild chronic small vessel ischemic changes. 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 4. Multiple osseous lesions consistent with known multiple myeloma. 5. Critical Value/emergent results text paged to [place] via [employer] secure system on 09/16/2018 at 12:18 am, including interpreting physician's phone number. Electronically Signed   By: Elon Alas M.D.   On: 09/16/2018 00:18    Assessment/Plan  1. Acute encephalopathy -  She was reported to have been playing with her feces this morning, will need to start ordered Zosyn Q 12 hours X 7 days, she agreed to have PICC line inserted if there is a need    2. Hypertension, accelerated - BP 164/90, continue Catapres TTS 0.2 patch transdermal Q week, BP/HR BID X 1 week , continue losartan 50 mg 1 tab q. evening, amlodipine 10 mg 1 tab daily and clonidine 0.1 mg 1 tab every 8 hours as needed for SBP >180    3. Slow transit constipation -was recently started on senna S8 0.6-50 mg 2 tabs p.o. twice daily and lactulose as needed, will order KUB due to complaints of abdominal pain     Family/ staff Communication: Discussed plan of care with resident.  Labs/tests ordered: KUB  Goals of care:   Long-term care   Durenda Age, NP [facility] [phone] (Monday-Friday 8:00 a.m. -  5:00 p.m.) [phone] (after hours)

## 2018-09-29 DIAGNOSIS — Z66 Do not resuscitate: Secondary | ICD-10-CM

## 2018-10-03 ENCOUNTER — Encounter (HOSPITAL_COMMUNITY): Payer: Self-pay | Admitting: Emergency Medicine

## 2018-10-03 ENCOUNTER — Inpatient Hospital Stay (HOSPITAL_COMMUNITY)
Admission: EM | Admit: 2018-10-03 | Discharge: 2018-10-14 | DRG: 314 | Disposition: A | Discharge status: Skilled Nursing Facility | Payer: Medicare Other | Source: Skilled Nursing Facility | Attending: Internal Medicine | Admitting: Internal Medicine

## 2018-10-03 ENCOUNTER — Emergency Department (HOSPITAL_COMMUNITY): Payer: Medicare Other

## 2018-10-03 DIAGNOSIS — I161 Hypertensive emergency: Secondary | ICD-10-CM | POA: Diagnosis present

## 2018-10-03 DIAGNOSIS — M546 Pain in thoracic spine: Secondary | ICD-10-CM | POA: Diagnosis not present

## 2018-10-03 DIAGNOSIS — E871 Hypo-osmolality and hyponatremia: Secondary | ICD-10-CM | POA: Diagnosis present

## 2018-10-03 DIAGNOSIS — I158 Other secondary hypertension: Secondary | ICD-10-CM | POA: Diagnosis present

## 2018-10-03 DIAGNOSIS — Z8619 Personal history of other infectious and parasitic diseases: Secondary | ICD-10-CM | POA: Diagnosis not present

## 2018-10-03 DIAGNOSIS — I5032 Chronic diastolic (congestive) heart failure: Secondary | ICD-10-CM | POA: Diagnosis present

## 2018-10-03 DIAGNOSIS — F0390 Unspecified dementia without behavioral disturbance: Secondary | ICD-10-CM | POA: Diagnosis not present

## 2018-10-03 DIAGNOSIS — T80211A Bloodstream infection due to central venous catheter, initial encounter: Secondary | ICD-10-CM | POA: Diagnosis present

## 2018-10-03 DIAGNOSIS — I38 Endocarditis, valve unspecified: Secondary | ICD-10-CM | POA: Diagnosis present

## 2018-10-03 DIAGNOSIS — Z6821 Body mass index (BMI) 21.0-21.9, adult: Secondary | ICD-10-CM | POA: Diagnosis not present

## 2018-10-03 DIAGNOSIS — Y848 Other medical procedures as the cause of abnormal reaction of the patient, or of later complication, without mention of misadventure at the time of the procedure: Secondary | ICD-10-CM | POA: Diagnosis present

## 2018-10-03 DIAGNOSIS — R7881 Bacteremia: Secondary | ICD-10-CM

## 2018-10-03 DIAGNOSIS — Z531 Procedure and treatment not carried out because of patient's decision for reasons of belief and group pressure: Secondary | ICD-10-CM | POA: Diagnosis present

## 2018-10-03 DIAGNOSIS — Z993 Dependence on wheelchair: Secondary | ICD-10-CM

## 2018-10-03 DIAGNOSIS — Z1624 Resistance to multiple antibiotics: Secondary | ICD-10-CM | POA: Diagnosis present

## 2018-10-03 DIAGNOSIS — R54 Age-related physical debility: Secondary | ICD-10-CM | POA: Diagnosis present

## 2018-10-03 DIAGNOSIS — N2581 Secondary hyperparathyroidism of renal origin: Secondary | ICD-10-CM | POA: Diagnosis present

## 2018-10-03 DIAGNOSIS — I6381 Other cerebral infarction due to occlusion or stenosis of small artery: Secondary | ICD-10-CM | POA: Diagnosis present

## 2018-10-03 DIAGNOSIS — N186 End stage renal disease: Secondary | ICD-10-CM | POA: Diagnosis present

## 2018-10-03 DIAGNOSIS — F329 Major depressive disorder, single episode, unspecified: Secondary | ICD-10-CM | POA: Diagnosis present

## 2018-10-03 DIAGNOSIS — B9689 Other specified bacterial agents as the cause of diseases classified elsewhere: Secondary | ICD-10-CM | POA: Diagnosis not present

## 2018-10-03 DIAGNOSIS — Z9115 Patient's noncompliance with renal dialysis: Secondary | ICD-10-CM | POA: Diagnosis not present

## 2018-10-03 DIAGNOSIS — Z888 Allergy status to other drugs, medicaments and biological substances status: Secondary | ICD-10-CM

## 2018-10-03 DIAGNOSIS — C9000 Multiple myeloma not having achieved remission: Secondary | ICD-10-CM | POA: Diagnosis not present

## 2018-10-03 DIAGNOSIS — I7389 Other specified peripheral vascular diseases: Secondary | ICD-10-CM | POA: Diagnosis present

## 2018-10-03 DIAGNOSIS — Z992 Dependence on renal dialysis: Secondary | ICD-10-CM

## 2018-10-03 DIAGNOSIS — I272 Pulmonary hypertension, unspecified: Secondary | ICD-10-CM | POA: Diagnosis present

## 2018-10-03 DIAGNOSIS — I132 Hypertensive heart and chronic kidney disease with heart failure and with stage 5 chronic kidney disease, or end stage renal disease: Secondary | ICD-10-CM | POA: Diagnosis present

## 2018-10-03 DIAGNOSIS — C9002 Multiple myeloma in relapse: Secondary | ICD-10-CM | POA: Diagnosis present

## 2018-10-03 DIAGNOSIS — A4101 Sepsis due to Methicillin susceptible Staphylococcus aureus: Secondary | ICD-10-CM | POA: Diagnosis not present

## 2018-10-03 DIAGNOSIS — K219 Gastro-esophageal reflux disease without esophagitis: Secondary | ICD-10-CM | POA: Diagnosis present

## 2018-10-03 DIAGNOSIS — G9341 Metabolic encephalopathy: Secondary | ICD-10-CM | POA: Diagnosis present

## 2018-10-03 DIAGNOSIS — D649 Anemia, unspecified: Secondary | ICD-10-CM | POA: Diagnosis not present

## 2018-10-03 DIAGNOSIS — D531 Other megaloblastic anemias, not elsewhere classified: Secondary | ICD-10-CM | POA: Diagnosis not present

## 2018-10-03 DIAGNOSIS — Z66 Do not resuscitate: Secondary | ICD-10-CM | POA: Diagnosis not present

## 2018-10-03 DIAGNOSIS — R410 Disorientation, unspecified: Secondary | ICD-10-CM | POA: Diagnosis not present

## 2018-10-03 DIAGNOSIS — R404 Transient alteration of awareness: Secondary | ICD-10-CM

## 2018-10-03 DIAGNOSIS — Z5329 Procedure and treatment not carried out because of patient's decision for other reasons: Secondary | ICD-10-CM | POA: Diagnosis not present

## 2018-10-03 DIAGNOSIS — Z79899 Other long term (current) drug therapy: Secondary | ICD-10-CM

## 2018-10-03 DIAGNOSIS — F05 Delirium due to known physiological condition: Secondary | ICD-10-CM | POA: Diagnosis not present

## 2018-10-03 DIAGNOSIS — I120 Hypertensive chronic kidney disease with stage 5 chronic kidney disease or end stage renal disease: Secondary | ICD-10-CM

## 2018-10-03 DIAGNOSIS — Z515 Encounter for palliative care: Secondary | ICD-10-CM | POA: Diagnosis not present

## 2018-10-03 DIAGNOSIS — R627 Adult failure to thrive: Secondary | ICD-10-CM | POA: Diagnosis present

## 2018-10-03 DIAGNOSIS — Z8249 Family history of ischemic heart disease and other diseases of the circulatory system: Secondary | ICD-10-CM

## 2018-10-03 DIAGNOSIS — F419 Anxiety disorder, unspecified: Secondary | ICD-10-CM | POA: Diagnosis present

## 2018-10-03 DIAGNOSIS — R451 Restlessness and agitation: Secondary | ICD-10-CM | POA: Diagnosis not present

## 2018-10-03 DIAGNOSIS — E8889 Other specified metabolic disorders: Secondary | ICD-10-CM | POA: Diagnosis present

## 2018-10-03 DIAGNOSIS — R4182 Altered mental status, unspecified: Secondary | ICD-10-CM | POA: Diagnosis present

## 2018-10-03 DIAGNOSIS — D631 Anemia in chronic kidney disease: Secondary | ICD-10-CM | POA: Diagnosis present

## 2018-10-03 DIAGNOSIS — R001 Bradycardia, unspecified: Secondary | ICD-10-CM | POA: Diagnosis not present

## 2018-10-03 DIAGNOSIS — B9561 Methicillin susceptible Staphylococcus aureus infection as the cause of diseases classified elsewhere: Secondary | ICD-10-CM | POA: Diagnosis not present

## 2018-10-03 DIAGNOSIS — R1310 Dysphagia, unspecified: Secondary | ICD-10-CM | POA: Diagnosis present

## 2018-10-03 LAB — I-STAT VENOUS BLOOD GAS, ED
Acid-Base Excess: 7 mmol/L — ABNORMAL HIGH (ref 0.0–2.0)
BICARBONATE: 31.6 mmol/L — AB (ref 20.0–28.0)
O2 Saturation: 64 %
PO2 VEN: 32 mmHg (ref 32.0–45.0)
TCO2: 33 mmol/L — ABNORMAL HIGH (ref 22–32)
pCO2, Ven: 45.1 mmHg (ref 44.0–60.0)
pH, Ven: 7.454 — ABNORMAL HIGH (ref 7.250–7.430)

## 2018-10-03 LAB — COMPREHENSIVE METABOLIC PANEL
ALT: 8 U/L (ref 0–44)
AST: 18 U/L (ref 15–41)
Albumin: 2.2 g/dL — ABNORMAL LOW (ref 3.5–5.0)
Alkaline Phosphatase: 49 U/L (ref 38–126)
Anion gap: 7 (ref 5–15)
BUN: 30 mg/dL — ABNORMAL HIGH (ref 8–23)
CO2: 29 mmol/L (ref 22–32)
Calcium: 9.4 mg/dL (ref 8.9–10.3)
Chloride: 95 mmol/L — ABNORMAL LOW (ref 98–111)
Creatinine, Ser: 7.27 mg/dL — ABNORMAL HIGH (ref 0.44–1.00)
GFR calc Af Amer: 6 mL/min — ABNORMAL LOW (ref >60)
GFR calc non Af Amer: 5 mL/min — ABNORMAL LOW (ref >60)
Glucose, Bld: 85 mg/dL (ref 70–99)
Potassium: 4.4 mmol/L (ref 3.5–5.1)
Sodium: 131 mmol/L — ABNORMAL LOW (ref 135–145)
Total Bilirubin: 0.2 mg/dL — ABNORMAL LOW (ref 0.3–1.2)
Total Protein: 9.3 g/dL — ABNORMAL HIGH (ref 6.5–8.1)

## 2018-10-03 LAB — AMMONIA: Ammonia: 19 umol/L (ref 9–35)

## 2018-10-03 LAB — CBC WITH DIFFERENTIAL/PLATELET
Abs Immature Granulocytes: 0.03 10*3/uL (ref 0.00–0.07)
Basophils Absolute: 0 10*3/uL (ref 0.0–0.1)
Basophils Relative: 0 %
Eosinophils Absolute: 0 10*3/uL (ref 0.0–0.5)
Eosinophils Relative: 1 %
HEMATOCRIT: 36.9 % (ref 36.0–46.0)
Hemoglobin: 10.8 g/dL — ABNORMAL LOW (ref 12.0–15.0)
Immature Granulocytes: 1 %
LYMPHS ABS: 0.5 10*3/uL — AB (ref 0.7–4.0)
Lymphocytes Relative: 12 %
MCH: 30.3 pg (ref 26.0–34.0)
MCHC: 29.3 g/dL — ABNORMAL LOW (ref 30.0–36.0)
MCV: 103.4 fL — ABNORMAL HIGH (ref 80.0–100.0)
Monocytes Absolute: 0.2 10*3/uL (ref 0.1–1.0)
Monocytes Relative: 5 %
Neutro Abs: 3.5 10*3/uL (ref 1.7–7.7)
Neutrophils Relative %: 81 %
Platelets: 149 10*3/uL — ABNORMAL LOW (ref 150–400)
RBC: 3.57 MIL/uL — ABNORMAL LOW (ref 3.87–5.11)
RDW: 18.6 % — ABNORMAL HIGH (ref 11.5–15.5)
WBC: 4.3 10*3/uL (ref 4.0–10.5)
nRBC: 0 % (ref 0.0–0.2)

## 2018-10-03 LAB — CBG MONITORING, ED: Glucose-Capillary: 83 mg/dL (ref 70–99)

## 2018-10-03 LAB — MRSA PCR SCREENING: MRSA by PCR: NEGATIVE

## 2018-10-03 MED ORDER — ACETAMINOPHEN 325 MG PO TABS
650.0000 mg | ORAL_TABLET | Freq: Two times a day (BID) | ORAL | Status: DC | PRN
  Administered 2018-10-06: 650 mg via ORAL
  Filled 2018-10-03: qty 2

## 2018-10-03 MED ORDER — PIPERACILLIN SOD-TAZOBACTAM SO 2.25 (2-0.25) G IV SOLR: 2.2500 g | Freq: Two times a day (BID) | INTRAVENOUS | Status: DC

## 2018-10-03 MED ORDER — ISOSORBIDE MONONITRATE ER 60 MG PO TB24
60.0000 mg | ORAL_TABLET | Freq: Every day | ORAL | Status: DC
  Administered 2018-10-05 – 2018-10-09 (×4): 60 mg via ORAL
  Filled 2018-10-03: qty 2
  Filled 2018-10-03 (×5): qty 1

## 2018-10-03 MED ORDER — AMLODIPINE BESYLATE 10 MG PO TABS
10.0000 mg | ORAL_TABLET | Freq: Every evening | ORAL | Status: DC
  Administered 2018-10-04 – 2018-10-08 (×5): 10 mg via ORAL
  Filled 2018-10-03 (×5): qty 1

## 2018-10-03 MED ORDER — CLONIDINE HCL 0.2 MG/24HR TD PTWK
0.2000 mg | MEDICATED_PATCH | TRANSDERMAL | Status: DC
  Administered 2018-10-07: 0.2 mg via TRANSDERMAL
  Filled 2018-10-03: qty 1

## 2018-10-03 MED ORDER — LORAZEPAM 2 MG/ML IJ SOLN
1.0000 mg | Freq: Once | INTRAMUSCULAR | Status: AC
  Administered 2018-10-03: 1 mg via INTRAVENOUS
  Filled 2018-10-03: qty 1

## 2018-10-03 MED ORDER — PRO-STAT SUGAR FREE PO LIQD
30.0000 mL | Freq: Two times a day (BID) | ORAL | Status: DC
  Administered 2018-10-04 – 2018-10-09 (×9): 30 mL via ORAL
  Filled 2018-10-03 (×10): qty 30

## 2018-10-03 MED ORDER — HEPARIN SODIUM (PORCINE) 5000 UNIT/ML IJ SOLN
5000.0000 [IU] | Freq: Three times a day (TID) | INTRAMUSCULAR | Status: DC
  Administered 2018-10-03 – 2018-10-09 (×15): 5000 [IU] via SUBCUTANEOUS
  Filled 2018-10-03 (×18): qty 1

## 2018-10-03 MED ORDER — HYDRALAZINE HCL 20 MG/ML IJ SOLN
10.0000 mg | Freq: Once | INTRAMUSCULAR | Status: AC
  Administered 2018-10-03: 10 mg via INTRAVENOUS
  Filled 2018-10-03: qty 1

## 2018-10-03 MED ORDER — LATANOPROST 0.005 % OP SOLN
1.0000 [drp] | Freq: Every day | OPHTHALMIC | Status: DC
  Administered 2018-10-04 – 2018-10-13 (×10): 1 [drp] via OPHTHALMIC
  Filled 2018-10-03: qty 2.5

## 2018-10-03 MED ORDER — NICARDIPINE HCL IN NACL 20-0.86 MG/200ML-% IV SOLN: 3.0000 mg/h | INTRAVENOUS | Status: DC

## 2018-10-03 MED ORDER — HYDRALAZINE HCL 20 MG/ML IJ SOLN
10.0000 mg | Freq: Four times a day (QID) | INTRAMUSCULAR | Status: DC | PRN
  Administered 2018-10-03 – 2018-10-10 (×3): 10 mg via INTRAVENOUS
  Filled 2018-10-03 (×5): qty 1

## 2018-10-03 MED ORDER — PIPERACILLIN-TAZOBACTAM 3.375 G IVPB
3.3750 g | Freq: Two times a day (BID) | INTRAVENOUS | Status: DC
  Administered 2018-10-03 – 2018-10-04 (×3): 3.375 g via INTRAVENOUS
  Filled 2018-10-03 (×3): qty 50

## 2018-10-03 MED ORDER — CLEVIDIPINE BUTYRATE 0.5 MG/ML IV EMUL: 0.0000 mg/h | INTRAVENOUS | Status: DC

## 2018-10-03 MED ORDER — CHLORHEXIDINE GLUCONATE CLOTH 2 % EX PADS
6.0000 | MEDICATED_PAD | Freq: Every day | CUTANEOUS | Status: DC
  Administered 2018-10-04 – 2018-10-06 (×3): 6 via TOPICAL

## 2018-10-03 MED ORDER — VALACYCLOVIR HCL 500 MG PO TABS
500.0000 mg | ORAL_TABLET | ORAL | Status: DC
  Administered 2018-10-05 – 2018-10-09 (×2): 500 mg via ORAL
  Filled 2018-10-03 (×7): qty 1

## 2018-10-03 MED ORDER — CALCIUM ACETATE (PHOS BINDER) 667 MG PO CAPS
1334.0000 mg | ORAL_CAPSULE | Freq: Three times a day (TID) | ORAL | Status: DC
  Administered 2018-10-04 – 2018-10-09 (×13): 1334 mg via ORAL
  Filled 2018-10-03 (×16): qty 2

## 2018-10-03 MED ORDER — CLONIDINE HCL 0.1 MG PO TABS: 0.1000 mg | ORAL_TABLET | Freq: Three times a day (TID) | ORAL | Status: DC | PRN

## 2018-10-03 MED ORDER — LORAZEPAM 0.5 MG PO TABS
0.5000 mg | ORAL_TABLET | Freq: Every day | ORAL | Status: DC
  Administered 2018-10-04 – 2018-10-08 (×5): 0.5 mg via ORAL
  Filled 2018-10-03 (×5): qty 1

## 2018-10-03 MED ORDER — LOSARTAN POTASSIUM 50 MG PO TABS
50.0000 mg | ORAL_TABLET | Freq: Every evening | ORAL | Status: DC
  Administered 2018-10-04 – 2018-10-08 (×5): 50 mg via ORAL
  Filled 2018-10-03 (×6): qty 1

## 2018-10-03 MED ORDER — LACTULOSE 10 GM/15ML PO SOLN
30.0000 g | Freq: Every day | ORAL | Status: DC | PRN
  Filled 2018-10-03: qty 45

## 2018-10-03 MED ORDER — LAMOTRIGINE 25 MG PO TABS
50.0000 mg | ORAL_TABLET | Freq: Every day | ORAL | Status: DC
  Administered 2018-10-05 – 2018-10-13 (×8): 50 mg via ORAL
  Filled 2018-10-03 (×13): qty 2

## 2018-10-03 MED ORDER — RENA-VITE PO TABS
1.0000 | ORAL_TABLET | Freq: Every day | ORAL | Status: DC
  Administered 2018-10-05 – 2018-10-09 (×4): 1 via ORAL
  Filled 2018-10-03 (×7): qty 1

## 2018-10-03 NOTE — ED Triage Notes (Signed)
Patient arrived from [facility] , staff reported pt. refused hemodialysis yesterday , they sent her here for hemodialysis treatment , pt. is poor historian due to dementia , respirations unlabored.

## 2018-10-03 NOTE — ED Notes (Signed)
Dr. Joelyn Oms will see pt for renal per Dr. Nevada Crane-

## 2018-10-03 NOTE — ED Notes (Signed)
MD Nevada Crane advised patient BP and unable to take PO BP medication advised to give 10mg  Hydralazine.

## 2018-10-03 NOTE — ED Notes (Signed)
MD Nevada Crane aware attempted to give patient's PO medication patient unable to swallow.

## 2018-10-03 NOTE — H&P (Signed)
History and Physical  [name] HQR:[id number] DOB: 10-27-1940 DOA: 10/03/2018  Referring physician: Dr Leonette Monarch  PCP: Hendricks Limes, MD  Outpatient Specialists: Cardiology, Nephrology, Radiation Oncology Patient coming from: [facility]  Chief Complaint: Altered mental status  HPI: [name] is a 78 y.o. female with medical history significant for end-stage renal disease on hemodialysis Monday Wednesday Friday, multiple myeloma, dementia, hypertension, chronic diastolic CHF, chronic hyponatremia, ambulatory dysfunction wheelchair-bound who presented to [facility] ED from SNF due to sudden change in behavior last night.  Patient is unable to provide a history due to altered mental status.  History is obtained from her nursing facility and from medical records.  Per RN at SNF around 11 PM last night patient became suddenly confused, pulling out her IVs being uncooperative and aggressive.  No recent changes in her medications.  Blood pressure was excessively elevated.  Patient was brought to the ED for further evaluation.  Per RN patient has had intermittent change in behavior in the last few months but just not as severe.   Patient has been refusing hemodialysis.  Last hemodialysis was last Wednesday.  Recently discharged on IV Zosyn for Achromobacter bacteremia.  [facility] asked to admit.  ED Course: Upon presentation to the ED patient is significantly hypertensive.  CT head unremarkable for any acute intracranial findings.  Lab studies remarkable for hyponatremia with sodium level of 131.  Leukocytosis with WBC 12.7.  Review of Systems: Review of systems as noted in the HPI. All other systems reviewed and are negative.   Past Medical History:  Diagnosis Date  . Anemia    from dx list from [place]  . Anxiety   . Closed fracture of right distal femur ([place]) 09/01/2015  . Depression   . ESRD (end stage renal disease) on dialysis [facility])    "TTS; [place] [facility]" (04/24/2017)  . GERD  (gastroesophageal reflux disease) 04/23/2015  . HCAP (healthcare-associated pneumonia) 04/24/2017  . Heart murmur   . History of blood transfusion    "low HgB when I was going to dialysis center"  . Hypertension   . Hypertension associated with end stage renal disease on dialysis 03/11/2014  . Malnutrition of moderate degree 09/02/2015  . Multiple myeloma ([place]) 03/11/2014  . Patient is Jehov[employer]'s Witness    "I'd rather never have any blood transfusions unless absolutely necessary; please check with me 1st". (04/24/2017)  . Pneumonia ~ 2016  . Transfusion history    08/25/2018 hemoglobin 7.1,+ FOB, status post 2 units packed cells   Past Surgical History:  Procedure Laterality Date  . AV FISTULA PLACEMENT Left   . I&D EXTREMITY Left 02/27/2016   Procedure: ARTHROSCOPIC IRRIGATION AND DEBRIDEMENT EXTREMITY;  Surgeon: Renette Butters, MD;  Location: [place];  Service: Orthopedics;  Laterality: Left;  . IR DIALY SHUNT INTRO NEEDLE/INTRACATH INITIAL W/IMG LEFT Left 08/29/2018  . IR FLUORO GUIDE CV LINE RIGHT  08/29/2018  . IR US GUIDE VASC ACCESS RIGHT  08/29/2018  . TEE WITHOUT CARDIOVERSION N/A 02/29/2016   Procedure: TRANSESOPHAGEAL ECHOCARDIOGRAM (TEE);  Surgeon: Thayer Headings, MD;  Location: [place];  Service: Cardiovascular;  Laterality: N/A;    Social History:  reports that she has never smoked. She has never used smokeless tobacco. She reports that she does not drink alcohol or use drugs.   Allergies  Allergen Reactions  . Phenergan [Promethazine Hcl] Other (See Comments)    Acute encephalopathy in the context of refusal to go to hemodialysis and administration of Phenergan for nausea  and vomiting    Family History  Problem Relation Age of Onset  . Hypertension Mother   . Hypertension Father       Prior to Admission medications   Medication Sig Start Date End Date Taking? Authorizing Provider  acetaminophen (TYLENOL) 325 MG tablet Take 650 mg by mouth every 6 (six)  hours as needed for fever or headache (pain). Do not exceed 3000 mg in 24 hours   Yes [provider]  amLODipine (NORVASC) 10 MG tablet Take 10 mg by mouth every evening. Hold for SBP <100MM/HG   Yes [provider]  bisacodyl (DULCOLAX) 10 MG suppository Place 10 mg rectally daily as needed for moderate constipation (constipation not relieved by MOM).   Yes [provider]  calcium acetate (PHOSLO) 667 MG capsule Take 1,334 mg by mouth 3 (three) times daily with meals.   Yes [provider]  cloNIDine (CATAPRES - DOSED IN MG/24 HR) 0.2 mg/24hr patch Place 0.2 mg onto the skin once a week.   Yes [provider]  cloNIDine (CATAPRES) 0.1 MG tablet Take 0.1 mg by mouth every 8 (eight) hours as needed (sbp >180).    Yes [provider]  guaiFENesin-dextromethorphan (ROBITUSSIN DM) 100-10 MG/5ML syrup Take 10 mLs by mouth every 4 (four) hours as needed for cough.   Yes [provider]  hydrocortisone cream 1 % Apply 1 application topically daily as needed for itching.   Yes [provider]  isosorbide mononitrate (IMDUR) 60 MG 24 hr tablet Take 60 mg by mouth daily.  03/24/15  Yes [provider]  lactulose (CHRONULAC) 10 GM/15ML solution Take 30 g by mouth daily as needed for mild constipation.   Yes [provider]  lamoTRIgine (LAMICTAL) 25 MG tablet Take 50 mg by mouth daily. Take 2 tablets to = 50 mg   Yes [provider]  latanoprost (XALATAN) 0.005 % ophthalmic solution Place 1 drop into both eyes at bedtime.    Yes [provider]  loperamide (IMODIUM A-D) 2 MG tablet Take 4 mg by mouth daily as needed (on Dialysis days).    Yes [provider]  LORazepam (ATIVAN) 0.5 MG tablet Take 1 tablet (0.5 mg total) by mouth at bedtime. 09/19/18  Yes Rai, Ripudeep K, MD  losartan (COZAAR) 50 MG tablet Take 50 mg by mouth every evening.   Yes [provider]  multivitamin (RENA-VIT)  TABS tablet Take 1 tablet by mouth daily.   Yes [provider]  piperacillin-tazobactam (ZOSYN) 2.25 (2-0.25) g injection Inject 2.25 g into the muscle every 12 (twelve) hours.   Yes [provider]  polyethylene glycol (MIRALAX / GLYCOLAX) packet Take 17 g by mouth daily.   Yes [provider]  ranitidine (ZANTAC) 300 MG tablet Take 300 mg by mouth at bedtime. Reported on 03/05/2016   Yes [provider]  senna-docusate (SENOKOT-S) 8.6-50 MG tablet Take 1 tablet by mouth 2 (two) times daily.   Yes [provider]  simethicone (MYLICON) 287 MG chewable tablet Chew 125 mg by mouth daily. 6PM   Yes [provider]  valACYclovir (VALTREX) 500 MG tablet Take 1 tablet (500 mg total) by mouth every other day. 03/01/16  Yes Hongalgi, Lenis Dickinson, MD  sevelamer carbonate (RENVELA) 800 MG tablet Take 3 tablets (2,400 mg total) by mouth 3 (three) times daily with meals. Patient not taking: Reported on 10/03/2018 09/19/18   Mendel Corning, MD    Physical Exam: BP 134/86  Pulse 69   Temp (!) 97.3 F (36.3 C) (Rectal)   Resp (!) 8   SpO2 98%   . General: 78 y.o. year-old female well developed well nourished in no acute distress.  Alert and confused in the setting of advanced dementia. . Cardiovascular: Regular rate and rhythm with no rubs or gallops.  No thyromegaly or JVD noted.  No lower extremity edema. 2/4 pulses in all 4 extremities. Marland Kitchen Respiratory: Clear to auscultation with no wheezes or rales. Good inspiratory effort. . Abdomen: Soft nontender nondistended with normal bowel sounds x4 quadrants. . Muskuloskeletal: No cyanosis, clubbing or edema noted bilaterally . Neuro: CN II-XII intact, strength, sensation, reflexes . Skin: No ulcerative lesions noted or rashes on limited physical exam. . Psychiatry: Judgement and insight appear poor. Mood is appropriate for condition and setting          Labs on Admission:  Basic Metabolic Panel: Recent Labs    Lab 10/03/18 0325  NA 131*  K 4.4  CL 95*  CO2 29  GLUCOSE 85  BUN 30*  CREATININE 7.27*  CALCIUM 9.4   Liver Function Tests: Recent Labs  Lab 10/03/18 0325  AST 18  ALT 8  ALKPHOS 49  BILITOT 0.2*  PROT 9.3*  ALBUMIN 2.2*   No results for input(s): LIPASE, AMYLASE in the last 168 hours. Recent Labs  Lab 10/03/18 0326  AMMONIA 19   CBC: Recent Labs  Lab 10/03/18 0325  WBC 4.3  NEUTROABS 3.5  HGB 10.8*  HCT 36.9  MCV 103.4*  PLT 149*   Cardiac Enzymes: No results for input(s): CKTOTAL, CKMB, CKMBINDEX, TROPONINI in the last 168 hours.  BNP (last 3 results) No results for input(s): BNP in the last 8760 hours.  ProBNP (last 3 results) No results for input(s): PROBNP in the last 8760 hours.  CBG: Recent Labs  Lab 10/03/18 0347  GLUCAP 83    Radiological Exams on Admission: Ct Head Wo Contrast  Result Date: 10/03/2018 CLINICAL DATA:  Acute onset of altered mental status. EXAM: CT HEAD WITHOUT CONTRAST TECHNIQUE: Contiguous axial images were obtained from the base of the skull through the vertex without intravenous contrast. COMPARISON:  CT of the head and MRI of the brain performed 09/16/2018 FINDINGS: Brain: No evidence of acute infarction, hemorrhage, hydrocephalus, extra-axial collection or mass lesion / mass effect. Prominence of the ventricles and sulci reflects mild cortical volume loss. Mild cerebellar atrophy is noted. Scattered periventricular and subcortical white matter change likely reflects small vessel ischemic microangiopathy. Chronic ischemic change is noted at the basal ganglia bilaterally. A small chronic lacunar infarct is noted at the left cerebellar hemisphere. The brainstem and fourth ventricle are within normal limits. The cerebral hemispheres demonstrate grossly normal gray-white differentiation. No mass effect or midline shift is seen. Vascular: No hyperdense vessel or unexpected calcification. Skull: There is no evidence of fracture.  Numerous large lytic lesions are noted throughout the visualized osseous structures, measuring up to 2.0 cm in size. These likely reflect the patient's known multiple myeloma. Sinuses/Orbits: The orbits are within normal limits. The paranasal sinuses and mastoid air cells are well-aerated. Other: No significant soft tissue abnormalities are seen. IMPRESSION: 1. No acute intracranial pathology seen on CT. 2. Mild cortical volume loss and scattered small vessel ischemic microangiopathy. 3. Chronic ischemic change at the basal ganglia bilaterally. Small chronic lacunar infarct at the left cerebellar hemisphere. 4. Numerous large lytic lesions throughout the visualized osseous structures, measuring up to 2.0 cm in size. These likely  reflect the patient's known multiple myeloma. Electronically Signed   By: Garald Balding M.D.   On: 10/03/2018 05:11    EKG: I independently viewed the EKG done and my findings are as followed: NS rhythm with rate of 60.  No specific ST-T changes.  Assessment/Plan Present on Admission: . Acute metabolic encephalopathy  Active Problems:   Acute metabolic encephalopathy  Acute metabolic encephalopathy possibly secondary to emergency hypertension Blood pressure improving with IV antihypertensives Resume clonidine and other home antihypertensive medications Reorient as needed Fall precautions CT head done on 10/03/2018 independently reviewed revealed no acute intracranial findings.  However it revealed small chronic lacunar infarct affecting left cerebellar.  Numerous large lytic lesions throughout osseous structures measuring up to 2 cm suspected from known multiple myeloma. Continue close monitoring  Hypertensive emergency Presented with diastolic blood pressures in the 150's and altered mental status Resume home antihypertensive medications IV hydralazine 10 mg every 6 hours as needed for systolic blood pressure greater than 037 or diastolic greater than 048  End-stage  renal disease on hemodialysis Monday Wednesday Friday Patient has refused hemodialysis multiple times recently Last hemodialysis was last Wednesday Nephrology consulted for hemodialysis Monitor electrolytes  Multiple myeloma Patient undergoes radiation once a week Follows with oncology outpatient  Chronic hyponatremia, appears euvolemic Sodium 131 Electrolytes and volume status managed by nephrology Repeat BMP in the morning  Anemia of chronic disease Hemoglobin stable at 10.8 from 8.3 on 09/19/2018 This may represent hemoconcentration due to poor oral intake CBC in the morning  Recent history of bacteremia Blood cultures drawn on 08/25/2018 showed multi- drug-resistant Acromobacter Currently on IV Zosyn per her RN at SNF Resume IV Zosyn  Chronic diastolic CHF Last 2D echo done on 08/27/2018 revealed preserved LVEF and grade 1 diastolic dysfunction Resume cardiac medications  Moderate pulmonary hypertension Pulmonary arterial pressure 44 mmHg No evidence of vegetation Follows with cardiology outpatient  Risks: Patient is high risk for the compensation due to acute metabolic encephalopathy suspect secondary to hypertensive emergency, missing sessions of hemodialysis, multiple comorbidities and advanced age.  Patient will require at least 2 midnights for further evaluation and treatment of present condition.  DVT prophylaxis: Subcu heparin 5000 units 3 times daily  Code Status: Full code until otherwise noted by family  Family Communication: Called daughter multiple times to update and left messages.  Disposition Plan: Admit to telemetry unit  Consults called: Nephrology  Admission status: Inpatient status    Kayleen Memos MD [facility] Pager [phone]  If 7PM-7AM, please contact night-coverage [email or web address] Password [facility]  10/03/2018, 12:37 PM

## 2018-10-03 NOTE — Progress Notes (Signed)
Vanice Sarah, RN called and notified that Pt's HD tx has been moved to 10/04/2018 by MD.

## 2018-10-03 NOTE — ED Notes (Signed)
Pharmacist notified on pt.'s Cardene drip order.

## 2018-10-03 NOTE — Consult Note (Signed)
[facility] Renal Consultation Note    Indication for Consultation:  Management of ESRD/hemodialysis; anemia, hypertension/volume and secondary hyperparathyroidism  HPI: [name] is a 78 y.o. female  with ESRD on HD TTS, HTN, multiple myeloma (followed by [facility] oncology), chronic hyponatremia, dementia, hx of Achromobacter bacteremia. Recent [facility] adm 12/20 with AMS felt 2/2 to HTN encephalopathy on dementia. Returned to baseline MS at discharge.   Admitted again with AMS. Noted increased confusion at NH last night and brought to ED for further evaluation. Head CT with no acute changes. Chronic ischemic changes at basal ganglia/left cerebellar hemisphere. Numerous lytic lesions throughout c/s known multiple myeloma.   Labs: Na 131, K 4.4, Glu 85, BUN 30 Cr 7.27, WBC 4.3, Hgb  10.8, Ammonia 19.   Notably  hypertensive in ED. BP 179/106. IV hydralazine given. Blood and urine cultures ordered.   Seen and examined in ED. Awake but not responding to questions or following commands.   Last dialysis 12/30. Completed full treatment and met dry weight. Apparently refused scheduled HD yesterday.   Past Medical History:  Diagnosis Date  . Anemia    from dx list from [place]  . Anxiety   . Closed fracture of right distal femur ([place]) 09/01/2015  . Depression   . ESRD (end stage renal disease) on dialysis [facility])    "TTS; [place]" (04/24/2017)  . GERD (gastroesophageal reflux disease) 04/23/2015  . HCAP (healthcare-associated pneumonia) 04/24/2017  . Heart murmur   . History of blood transfusion    "low HgB when I was going to dialysis center"  . Hypertension   . Hypertension associated with end stage renal disease on dialysis 03/11/2014  . Malnutrition of moderate degree 09/02/2015  . Multiple myeloma ([place]) 03/11/2014  . Patient is Jehovah's Witness    "I'd rather never have any blood transfusions unless absolutely necessary; please check with me 1st". (04/24/2017)  . Pneumonia ~  2016  . Transfusion history    08/25/2018 hemoglobin 7.1,+ FOB, status post 2 units packed cells   Past Surgical History:  Procedure Laterality Date  . AV FISTULA PLACEMENT Left   . I&D EXTREMITY Left 02/27/2016   Procedure: ARTHROSCOPIC IRRIGATION AND DEBRIDEMENT EXTREMITY;  Surgeon: Renette Butters, MD;  Location: [place];  Service: Orthopedics;  Laterality: Left;  . IR DIALY SHUNT INTRO NEEDLE/INTRACATH INITIAL W/IMG LEFT Left 08/29/2018  . IR FLUORO GUIDE CV LINE RIGHT  08/29/2018  . IR US GUIDE VASC ACCESS RIGHT  08/29/2018  . TEE WITHOUT CARDIOVERSION N/A 02/29/2016   Procedure: TRANSESOPHAGEAL ECHOCARDIOGRAM (TEE);  Surgeon: Thayer Headings, MD;  Location: [facility] ENDOSCOPY;  Service: Cardiovascular;  Laterality: N/A;   Family History  Problem Relation Age of Onset  . Hypertension Mother   . Hypertension Father    Social History:  reports that she has never smoked. She has never used smokeless tobacco. She reports that she does not drink alcohol or use drugs. Allergies  Allergen Reactions  . Phenergan [Promethazine Hcl] Other (See Comments)    Acute encephalopathy in the context of refusal to go to hemodialysis and administration of Phenergan for nausea and vomiting   Prior to Admission medications   Medication Sig Start Date End Date Taking? Authorizing Provider  acetaminophen (TYLENOL) 325 MG tablet Take 650 mg by mouth every 6 (six) hours as needed for fever or headache (pain). Do not exceed 3000 mg in 24 hours   Yes [provider]  amLODipine (NORVASC) 10 MG tablet Take 10 mg by  mouth every evening. Hold for SBP <100MM/HG   Yes [provider]  bisacodyl (DULCOLAX) 10 MG suppository Place 10 mg rectally daily as needed for moderate constipation (constipation not relieved by MOM).   Yes [provider]  calcium acetate (PHOSLO) 667 MG capsule Take 1,334 mg by mouth 3 (three) times daily with meals.   Yes [provider]  cloNIDine (CATAPRES -  DOSED IN MG/24 HR) 0.2 mg/24hr patch Place 0.2 mg onto the skin once a week.   Yes [provider]  cloNIDine (CATAPRES) 0.1 MG tablet Take 0.1 mg by mouth every 8 (eight) hours as needed (sbp >180).    Yes [provider]  guaiFENesin-dextromethorphan (ROBITUSSIN DM) 100-10 MG/5ML syrup Take 10 mLs by mouth every 4 (four) hours as needed for cough.   Yes [provider]  hydrocortisone cream 1 % Apply 1 application topically daily as needed for itching.   Yes [provider]  isosorbide mononitrate (IMDUR) 60 MG 24 hr tablet Take 60 mg by mouth daily.  03/24/15  Yes [provider]  lactulose (CHRONULAC) 10 GM/15ML solution Take 30 g by mouth daily as needed for mild constipation.   Yes [provider]  lamoTRIgine (LAMICTAL) 25 MG tablet Take 50 mg by mouth daily. Take 2 tablets to = 50 mg   Yes [provider]  latanoprost (XALATAN) 0.005 % ophthalmic solution Place 1 drop into both eyes at bedtime.    Yes [provider]  loperamide (IMODIUM A-D) 2 MG tablet Take 4 mg by mouth daily as needed (on Dialysis days).    Yes [provider]  LORazepam (ATIVAN) 0.5 MG tablet Take 1 tablet (0.5 mg total) by mouth at bedtime. 09/19/18  Yes Rai, Ripudeep K, MD  losartan (COZAAR) 50 MG tablet Take 50 mg by mouth every evening.   Yes [provider]  multivitamin (RENA-VIT) TABS tablet Take 1 tablet by mouth daily.   Yes [provider]  piperacillin-tazobactam (ZOSYN) 2.25 (2-0.25) g injection Inject 2.25 g into the muscle every 12 (twelve) hours.   Yes [provider]  polyethylene glycol (MIRALAX / GLYCOLAX) packet Take 17 g by mouth daily.   Yes [provider]  ranitidine (ZANTAC) 300 MG tablet Take 300 mg by mouth at bedtime. Reported on 03/05/2016   Yes [provider]  senna-docusate (SENOKOT-S) 8.6-50 MG tablet Take 1 tablet by mouth 2 (two) times daily.   Yes [provider]  simethicone (MYLICON) 482 MG chewable tablet Chew 125 mg by mouth daily. 6PM   Yes [provider]  valACYclovir (VALTREX) 500 MG tablet Take 1 tablet (500 mg total) by mouth every other day. 03/01/16  Yes Hongalgi, Lenis Dickinson, MD  sevelamer carbonate (RENVELA) 800 MG tablet Take 3 tablets (2,400 mg total) by mouth 3 (three) times daily with meals. Patient not taking: Reported on 10/03/2018 09/19/18   Mendel Corning, MD   Current Facility-Administered Medications  Medication Dose Route Frequency Provider Last Rate Last Dose  . acetaminophen (TYLENOL) tablet 650 mg  650 mg Oral BID PRN Irene Pap N, DO      . amLODipine (NORVASC) tablet 10 mg  10 mg Oral QPM Hall, Carole N, DO      . calcium acetate (PHOSLO) capsule 1,334 mg  1,334 mg Oral TID WC Hall, Carole N, DO      . [START ON 10/07/2018] cloNIDine (CATAPRES - Dosed in mg/24 hr) patch 0.2 mg  0.2 mg Transdermal  Weekly Irene Pap N, DO      . cloNIDine (CATAPRES) tablet 0.1 mg  0.1 mg Oral Q8H PRN Irene Pap N, DO      . heparin injection 5,000 Units  5,000 Units Subcutaneous Q8H Hall, Carole N, DO      . hydrALAZINE (APRESOLINE) injection 10 mg  10 mg Intravenous Q6H PRN Irene Pap N, DO      . isosorbide mononitrate (IMDUR) 24 hr tablet 60 mg  60 mg Oral Daily Hall, Carole N, DO      . lactulose (CHRONULAC) 10 GM/15ML solution 30 g  30 g Oral Daily PRN Irene Pap N, DO      . lamoTRIgine (LAMICTAL) tablet 50 mg  50 mg Oral Daily Hall, Carole N, DO      . latanoprost (XALATAN) 0.005 % ophthalmic solution 1 drop  1 drop Both Eyes QHS Hall, Carole N, DO      . LORazepam (ATIVAN) tablet 0.5 mg  0.5 mg Oral QHS Hall, Carole N, DO      . losartan (COZAAR) tablet 50 mg  50 mg Oral QPM Hall, Carole N, DO      . multivitamin (RENA-VIT) tablet 1 tablet  1 tablet Oral Daily Hall, Carole N, DO      . piperacillin-tazobactam (ZOSYN) IVPB 3.375 g  3.375 g Intravenous Q12H Hall, Carole N, DO      . valACYclovir (VALTREX)  tablet 500 mg  500 mg Oral Pilar Jarvis, DO       Current Outpatient Medications  Medication Sig Dispense Refill  . acetaminophen (TYLENOL) 325 MG tablet Take 650 mg by mouth every 6 (six) hours as needed for fever or headache (pain). Do not exceed 3000 mg in 24 hours    . amLODipine (NORVASC) 10 MG tablet Take 10 mg by mouth every evening. Hold for SBP <100MM/HG    . bisacodyl (DULCOLAX) 10 MG suppository Place 10 mg rectally daily as needed for moderate constipation (constipation not relieved by MOM).    . calcium acetate (PHOSLO) 667 MG capsule Take 1,334 mg by mouth 3 (three) times daily with meals.    . cloNIDine (CATAPRES - DOSED IN MG/24 HR) 0.2 mg/24hr patch Place 0.2 mg onto the skin once a week.    . cloNIDine (CATAPRES) 0.1 MG tablet Take 0.1 mg by mouth every 8 (eight) hours as needed (sbp >180).     Marland Kitchen guaiFENesin-dextromethorphan (ROBITUSSIN DM) 100-10 MG/5ML syrup Take 10 mLs by mouth every 4 (four) hours as needed for cough.    . hydrocortisone cream 1 % Apply 1 application topically daily as needed for itching.    . isosorbide mononitrate (IMDUR) 60 MG 24 hr tablet Take 60 mg by mouth daily.     Marland Kitchen lactulose (CHRONULAC) 10 GM/15ML solution Take 30 g by mouth daily as needed for mild constipation.    Marland Kitchen lamoTRIgine (LAMICTAL) 25 MG tablet Take 50 mg by mouth daily. Take 2 tablets to = 50 mg    . latanoprost (XALATAN) 0.005 % ophthalmic solution Place 1 drop into both eyes at bedtime.     Marland Kitchen loperamide (IMODIUM A-D) 2 MG tablet Take 4 mg by mouth daily as needed (on Dialysis days).     . LORazepam (ATIVAN) 0.5 MG tablet Take 1 tablet (0.5 mg total) by mouth at bedtime. 15 tablet 0  . losartan (COZAAR) 50 MG tablet Take 50 mg by mouth every evening.    . multivitamin (RENA-VIT) TABS tablet Take  1 tablet by mouth daily.    . piperacillin-tazobactam (ZOSYN) 2.25 (2-0.25) g injection Inject 2.25 g into the muscle every 12 (twelve) hours.    . polyethylene glycol (MIRALAX /  GLYCOLAX) packet Take 17 g by mouth daily.    . ranitidine (ZANTAC) 300 MG tablet Take 300 mg by mouth at bedtime. Reported on 03/05/2016    . senna-docusate (SENOKOT-S) 8.6-50 MG tablet Take 1 tablet by mouth 2 (two) times daily.    . simethicone (MYLICON) 595 MG chewable tablet Chew 125 mg by mouth daily. 6PM    . valACYclovir (VALTREX) 500 MG tablet Take 1 tablet (500 mg total) by mouth every other day.    . sevelamer carbonate (RENVELA) 800 MG tablet Take 3 tablets (2,400 mg total) by mouth 3 (three) times daily with meals. (Patient not taking: Reported on 10/03/2018)      ROS: As per HPI otherwise negative.  Physical Exam: Vitals:   10/03/18 1300 10/03/18 1315 10/03/18 1330 10/03/18 1345  BP: (!) 174/108 (!) 179/106 (!) 171/107 (!) 170/115  Pulse: 64 64 66 65  Resp: 15 (!) 22 (!) 24 16  Temp:      TempSrc:      SpO2: 100% 100% 100% 100%     General:  Ill appearing elderly female NAD  Head: NCAT sclera not icteric MMM Neck: Supple. No JVD Lungs: CTA bilaterally without wheezes, rales, or rhonchi.  Heart: RRR 2/6 SEM  Abdomen: soft NT + BS Lower extremities: No LE edema or wounds  Neuro:  Not oriented. Does not follow commands.  Psych:  Nonverbal. Not responding to questions  Dialysis Access: R chest TDC Dsg clean, intact   Labs: Basic Metabolic Panel: Recent Labs  Lab 10/03/18 0325  NA 131*  K 4.4  CL 95*  CO2 29  GLUCOSE 85  BUN 30*  CREATININE 7.27*  CALCIUM 9.4   Liver Function Tests: Recent Labs  Lab 10/03/18 0325  AST 18  ALT 8  ALKPHOS 49  BILITOT 0.2*  PROT 9.3*  ALBUMIN 2.2*   No results for input(s): LIPASE, AMYLASE in the last 168 hours. Recent Labs  Lab 10/03/18 0326  AMMONIA 19   CBC: Recent Labs  Lab 10/03/18 0325  WBC 4.3  NEUTROABS 3.5  HGB 10.8*  HCT 36.9  MCV 103.4*  PLT 149*   Cardiac Enzymes: No results for input(s): CKTOTAL, CKMB, CKMBINDEX, TROPONINI in the last 168 hours. CBG: Recent Labs  Lab 10/03/18 0347  GLUCAP  83   Iron Studies: No results for input(s): IRON, TIBC, TRANSFERRIN, FERRITIN in the last 72 hours. Studies/Results: Ct Head Wo Contrast  Result Date: 10/03/2018 CLINICAL DATA:  Acute onset of altered mental status. EXAM: CT HEAD WITHOUT CONTRAST TECHNIQUE: Contiguous axial images were obtained from the base of the skull through the vertex without intravenous contrast. COMPARISON:  CT of the head and MRI of the brain performed 09/16/2018 FINDINGS: Brain: No evidence of acute infarction, hemorrhage, hydrocephalus, extra-axial collection or mass lesion / mass effect. Prominence of the ventricles and sulci reflects mild cortical volume loss. Mild cerebellar atrophy is noted. Scattered periventricular and subcortical white matter change likely reflects small vessel ischemic microangiopathy. Chronic ischemic change is noted at the basal ganglia bilaterally. A small chronic lacunar infarct is noted at the left cerebellar hemisphere. The brainstem and fourth ventricle are within normal limits. The cerebral hemispheres demonstrate grossly normal gray-white differentiation. No mass effect or midline shift is seen. Vascular: No hyperdense vessel or unexpected calcification.  Skull: There is no evidence of fracture. Numerous large lytic lesions are noted throughout the visualized osseous structures, measuring up to 2.0 cm in size. These likely reflect the patient's known multiple myeloma. Sinuses/Orbits: The orbits are within normal limits. The paranasal sinuses and mastoid air cells are well-aerated. Other: No significant soft tissue abnormalities are seen. IMPRESSION: 1. No acute intracranial pathology seen on CT. 2. Mild cortical volume loss and scattered small vessel ischemic microangiopathy. 3. Chronic ischemic change at the basal ganglia bilaterally. Small chronic lacunar infarct at the left cerebellar hemisphere. 4. Numerous large lytic lesions throughout the visualized osseous structures, measuring up to 2.0 cm in  size. These likely reflect the patient's known multiple myeloma. Electronically Signed   By: Garald Balding M.D.   On: 10/03/2018 05:11    Dialysis Orders:  AF TTS 3.5h 425/1.5x EDW 54kg 2K/2.25Ca  TDC No heparin bolus Hectorol 2 mcg IV TIW Retacrit 20000 U IV TIW   Assessment/Plan: 1. Acute encephalopathy -? HTN enceph. Recurrent admits with similar presentation. Head CT with no acute findings. Blood/urine cultures pending. Further w/u per primary.  2. ESRD -  TTS HD. Last HD 12/30. Refused HD yesterday. Short HD today then back on schedule tomorrow. 3. Hypertension/volume  - Very hypertensive on admit. Home meds amlodipine, losartan, clonidine. Likely refusing OP meds. IV Hydralazine ordered here.  Got below EDW last treatment Likely losing body weight. Follow weights here    4. Anemia  - Hgb 10.8. No ESA needs currently  5. Metabolic bone disease -  Continue VDRA. Ca acetate binder.  6. Nutrition - Renal diet/ prostat for low albumin  7. Multiple myeloma. Followed by [facility] oncology   Ogechi Larina Earthly PA-C [facility] Pager [phone] 10/03/2018, 2:13 PM

## 2018-10-03 NOTE — Progress Notes (Signed)
Patient was being transported with charge nurse and transport to [facility].  Patient attempted several times to sit up and jump out of bed.  Patient nearly had another fall.  Patient was transported back to unit and CT of head was not performed due to safety of patient. Schorr was contacted.  She was contacted prior to CT in order to get medication to relax patient. Schorr was contacted again once patient arrived back to the unit. Will continue to monitor patient. Awaiting orders.

## 2018-10-03 NOTE — ED Notes (Signed)
ED TO INPATIENT HANDOFF REPORT  ED Nurse Name and Phone #:  Augustin Schooling  Name/Age/Gender [name] 78 y.o. female Room/Bed: 057C/057C  Code Status   Code Status: Full Code Patient oriented to:  Home/SNF/Other Nursing Home  Is this baseline? Yes   Triage Complete: Triage complete   Chief Complaint No Dialysis  Triage Note Patient arrived from [facility] , staff reported pt. refused hemodialysis yesterday , they sent her here for hemodialysis treatment , pt. is poor historian due to dementia , respirations unlabored.    Allergies Allergies  Allergen Reactions  . Phenergan [Promethazine Hcl] Other (See Comments)    Acute encephalopathy in the context of refusal to go to hemodialysis and administration of Phenergan for nausea and vomiting    Level of Care/Admitting Diagnosis ED Disposition    ED Disposition Condition [place]: [place] [100100]  Level of Care: Medical Telemetry [104]  Diagnosis: Acute metabolic encephalopathy [7782423]  Admitting Physician: Kayleen Memos [[id number]]  Attending Physician: Kayleen Memos [[id number]]  Estimated length of stay: past midnight tomorrow  Certification:: I certify this patient will need inpatient services for at least 2 midnights  PT Class (Do Not Modify): Inpatient [101]  PT Acc Code (Do Not Modify): Private [1]       Medical/Surgery History Past Medical History:  Diagnosis Date  . Anemia    from dx list from [place]  . Anxiety   . Closed fracture of right distal femur ([place]) 09/01/2015  . Depression   . ESRD (end stage renal disease) on dialysis [facility])    "TTS; [place]" (04/24/2017)  . GERD (gastroesophageal reflux disease) 04/23/2015  . HCAP (healthcare-associated pneumonia) 04/24/2017  . Heart murmur   . History of blood transfusion    "low HgB when I was going to dialysis center"  . Hypertension   . Hypertension associated with end stage renal disease on  dialysis 03/11/2014  . Malnutrition of moderate degree 09/02/2015  . Multiple myeloma ([place]) 03/11/2014  . Patient is Jehov[employer]'s Witness    "I'd rather never have any blood transfusions unless absolutely necessary; please check with me 1st". (04/24/2017)  . Pneumonia ~ 2016  . Transfusion history    08/25/2018 hemoglobin 7.1,+ FOB, status post 2 units packed cells   Past Surgical History:  Procedure Laterality Date  . AV FISTULA PLACEMENT Left   . I&D EXTREMITY Left 02/27/2016   Procedure: ARTHROSCOPIC IRRIGATION AND DEBRIDEMENT EXTREMITY;  Surgeon: Renette Butters, MD;  Location: [facility];  Service: Orthopedics;  Laterality: Left;  . IR DIALY SHUNT INTRO NEEDLE/INTRACATH INITIAL W/IMG LEFT Left 08/29/2018  . IR FLUORO GUIDE CV LINE RIGHT  08/29/2018  . IR US GUIDE VASC ACCESS RIGHT  08/29/2018  . TEE WITHOUT CARDIOVERSION N/A 02/29/2016   Procedure: TRANSESOPHAGEAL ECHOCARDIOGRAM (TEE);  Surgeon: Thayer Headings, MD;  Location: [facility];  Service: Cardiovascular;  Laterality: N/A;     IV Location/Drains/Wounds Patient Lines/Drains/Airways Status   Active Line/Drains/Airways    Name:   Placement date:   Placement time:   Site:   Days:   Peripheral IV 10/03/18 Right Antecubital   10/03/18    0323    Antecubital   less than 1   Fistula / Graft Left Upper arm Arteriovenous fistula   -    -    Upper arm      Hemodialysis Catheter Right Internal jugular Double-lumen;Permanent   08/29/18    0920  Internal jugular   35   Pressure Injury 04/16/18 Stage II -  Partial thickness loss of dermis presenting as a shallow open ulcer with a red, pink wound bed without slough. stage II    04/16/18    1939     170          Intake/Output Last 24 hours No intake or output data in the 24 hours ending 10/03/18 1542  Labs/Imaging Results for orders placed or performed during the hospital encounter of 10/03/18 (from the past 48 hour(s))  Comprehensive metabolic panel     Status: Abnormal   Collection  Time: 10/03/18  3:25 AM  Result Value Ref Range   Sodium 131 (L) 135 - 145 mmol/L   Potassium 4.4 3.5 - 5.1 mmol/L   Chloride 95 (L) 98 - 111 mmol/L   CO2 29 22 - 32 mmol/L   Glucose, Bld 85 70 - 99 mg/dL   BUN 30 (H) 8 - 23 mg/dL   Creatinine, Ser 7.27 (H) 0.44 - 1.00 mg/dL   Calcium 9.4 8.9 - 10.3 mg/dL   Total Protein 9.3 (H) 6.5 - 8.1 g/dL   Albumin 2.2 (L) 3.5 - 5.0 g/dL   AST 18 15 - 41 U/L   ALT 8 0 - 44 U/L   Alkaline Phosphatase 49 38 - 126 U/L   Total Bilirubin 0.2 (L) 0.3 - 1.2 mg/dL   GFR calc non Af Amer 5 (L) >60 mL/min   GFR calc Af Amer 6 (L) >60 mL/min   Anion gap 7 5 - 15    Comment: Performed at [facility], [address], [place], [place] [address]  CBC WITH DIFFERENTIAL     Status: Abnormal   Collection Time: 10/03/18  3:25 AM  Result Value Ref Range   WBC 4.3 4.0 - 10.5 K/uL   RBC 3.57 (L) 3.87 - 5.11 MIL/uL   Hemoglobin 10.8 (L) 12.0 - 15.0 g/dL   HCT 36.9 36.0 - 46.0 %   MCV 103.4 (H) 80.0 - 100.0 fL   MCH 30.3 26.0 - 34.0 pg   MCHC 29.3 (L) 30.0 - 36.0 g/dL   RDW 18.6 (H) 11.5 - 15.5 %   Platelets 149 (L) 150 - 400 K/uL   nRBC 0.0 0.0 - 0.2 %   Neutrophils Relative % 81 %   Neutro Abs 3.5 1.7 - 7.7 K/uL   Lymphocytes Relative 12 %   Lymphs Abs 0.5 (L) 0.7 - 4.0 K/uL   Monocytes Relative 5 %   Monocytes Absolute 0.2 0.1 - 1.0 K/uL   Eosinophils Relative 1 %   Eosinophils Absolute 0.0 0.0 - 0.5 K/uL   Basophils Relative 0 %   Basophils Absolute 0.0 0.0 - 0.1 K/uL   Immature Granulocytes 1 %   Abs Immature Granulocytes 0.03 0.00 - 0.07 K/uL    Comment: Performed at [facility], [address], [place], [place] [address]  Ammonia     Status: None   Collection Time: 10/03/18  3:26 AM  Result Value Ref Range   Ammonia 19 9 - 35 umol/L    Comment: Performed at [facility], [place] [address], [place],  [address]  I-Stat venous blood gas, ED     Status: Abnormal   Collection Time: 10/03/18  3:43 AM  Result Value Ref Range    pH, Ven 7.454 (H) 7.250 - 7.430   pCO2, Ven 45.1 44.0 - 60.0 mmHg   pO2, Ven 32.0  32.0 - 45.0 mmHg   Bicarbonate 31.6 (H) 20.0 - 28.0 mmol/L   TCO2 33 (H) 22 - 32 mmol/L   O2 Saturation 64.0 %   Acid-Base Excess 7.0 (H) 0.0 - 2.0 mmol/L   Patient temperature HIDE    Sample type VENOUS    Comment NOTIFIED PHYSICIAN   CBG monitoring, ED     Status: None   Collection Time: 10/03/18  3:47 AM  Result Value Ref Range   Glucose-Capillary 83 70 - 99 mg/dL  Blood culture (routine x 2)     Status: None (Preliminary result)   Collection Time: 10/03/18  7:30 AM  Result Value Ref Range   Specimen Description BLOOD RIGHT HAND    Special Requests      BOTTLES DRAWN AEROBIC AND ANAEROBIC Blood Culture adequate volume   Culture      NO GROWTH < 12 HOURS Performed at [facility], [address], [place], [place] [address]    Report Status PENDING   Blood culture (routine x 2)     Status: None (Preliminary result)   Collection Time: 10/03/18  7:33 AM  Result Value Ref Range   Specimen Description BLOOD RIGHT ANTECUBITAL    Special Requests      BOTTLES DRAWN AEROBIC AND ANAEROBIC Blood Culture results may not be optimal due to an inadequate volume of blood received in culture bottles   Culture      NO GROWTH < 12 HOURS Performed at [facility] [address], [place], [place] [address]    Report Status PENDING    Ct Head Wo Contrast  Result Date: 10/03/2018 CLINICAL DATA:  Acute onset of altered mental status. EXAM: CT HEAD WITHOUT CONTRAST TECHNIQUE: Contiguous axial images were obtained from the base of the skull through the vertex without intravenous contrast. COMPARISON:  CT of the head and MRI of the brain performed 09/16/2018 FINDINGS: Brain: No evidence of acute infarction, hemorrhage, hydrocephalus, extra-axial collection or mass lesion / mass effect. Prominence of the ventricles and sulci reflects mild cortical volume loss. Mild cerebellar atrophy is noted. Scattered  periventricular and subcortical white matter change likely reflects small vessel ischemic microangiopathy. Chronic ischemic change is noted at the basal ganglia bilaterally. A small chronic lacunar infarct is noted at the left cerebellar hemisphere. The brainstem and fourth ventricle are within normal limits. The cerebral hemispheres demonstrate grossly normal gray-white differentiation. No mass effect or midline shift is seen. Vascular: No hyperdense vessel or unexpected calcification. Skull: There is no evidence of fracture. Numerous large lytic lesions are noted throughout the visualized osseous structures, measuring up to 2.0 cm in size. These likely reflect the patient's known multiple myeloma. Sinuses/Orbits: The orbits are within normal limits. The paranasal sinuses and mastoid air cells are well-aerated. Other: No significant soft tissue abnormalities are seen. IMPRESSION: 1. No acute intracranial pathology seen on CT. 2. Mild cortical volume loss and scattered small vessel ischemic microangiopathy. 3. Chronic ischemic change at the basal ganglia bilaterally. Small chronic lacunar infarct at the left cerebellar hemisphere. 4. Numerous large lytic lesions throughout the visualized osseous structures, measuring up to 2.0 cm in size. These likely reflect the patient's known multiple myeloma. Electronically Signed   By: Garald Balding M.D.   On: 10/03/2018 05:11    Pending Labs Unresulted Labs (From admission, onward)    Start     Ordered   10/04/18 0500  CBC with Differential/Platelet  Daily,   R     10/03/18  1308   10/04/18 5784  Basic metabolic panel  Daily,   R     10/03/18 1308   10/03/18 0314  Urinalysis, Complete w Microscopic  ONCE - STAT,   STAT     10/03/18 0314   10/03/18 0314  Blood gas, venous  Once,   STAT     10/03/18 0314          Vitals/Pain Today's Vitals   10/03/18 1345 10/03/18 1430 10/03/18 1500 10/03/18 1516  BP: (!) 170/115 (!) 165/103  (!) 167/117  Pulse: 65 65 69  66  Resp: _0 Temp:      TempSrc:      SpO2: 100% 100% 100% 100%  PainSc:        Isolation Precautions No active isolations  Medications Medications  hydrALAZINE (APRESOLINE) injection 10 mg (has no administration in time range)  acetaminophen (TYLENOL) tablet 650 mg (has no administration in time range)  calcium acetate (PHOSLO) capsule 1,334 mg (has no administration in time range)  cloNIDine (CATAPRES - Dosed in mg/24 hr) patch 0.2 mg (has no administration in time range)  cloNIDine (CATAPRES) tablet 0.1 mg (has no administration in time range)  isosorbide mononitrate (IMDUR) 24 hr tablet 60 mg (60 mg Oral Not Given 10/03/18 1431)  lactulose (CHRONULAC) 10 GM/15ML solution 30 g (has no administration in time range)  lamoTRIgine (LAMICTAL) tablet 50 mg (50 mg Oral Not Given 10/03/18 1430)  latanoprost (XALATAN) 0.005 % ophthalmic solution 1 drop (has no administration in time range)  LORazepam (ATIVAN) tablet 0.5 mg (has no administration in time range)  multivitamin (RENA-VIT) tablet 1 tablet (1 tablet Oral Not Given 10/03/18 1430)  valACYclovir (VALTREX) tablet 500 mg (500 mg Oral Not Given 10/03/18 1431)  heparin injection 5,000 Units (has no administration in time range)  amLODipine (NORVASC) tablet 10 mg (has no administration in time range)  losartan (COZAAR) tablet 50 mg (has no administration in time range)  piperacillin-tazobactam (ZOSYN) IVPB 3.375 g (3.375 g Intravenous New Bag/Given 10/03/18 1538)  feeding supplement (PRO-STAT SUGAR FREE 64) liquid 30 mL (has no administration in time range)  Chlorhexidine Gluconate Cloth 2 % PADS 6 each (has no administration in time range)  hydrALAZINE (APRESOLINE) injection 10 mg (10 mg Intravenous Given 10/03/18 6962)    Mobility non-ambulatory High fall risk   Focused Assessments Renal Assessment Handoff:  Hemodialysis Schedule: Hemodialysis Schedule: Tuesday/Thursday/Saturday Last Hemodialysis date and time: 09-30-18    Restricted appendage: left arm     Recommendations: See Admitting Provider Note  Report given to: Sam  Additional Notes: Pt non verbal

## 2018-10-03 NOTE — ED Provider Notes (Signed)
[facility] EMERGENCY DEPARTMENT Provider Note  CSN: [id number] Arrival date & time: 10/03/18 0251  Chief Complaint(s) Needs Hemodialysis  ED Triage Notes  Sangalang, Modena Jansky, RN (Registered Nurse) . [facility] . . 10/03/2018 2:59 AM . . Signed    Patient arrived from [facility] , staff reported pt. refused hemodialysis yesterday , they sent her here for hemodialysis treatment , pt. is poor historian due to dementia , respirations unlabored.       HPI [name] is a 78 y.o. female with extensive past medical history listed below including ESRD on TTS who was recently admitted for altered mental status due to hypertensive encephalopathy presents to the emergency department with altered mental status.    HPI  Past Medical History Past Medical History:  Diagnosis Date  . Anemia    from dx list from [place]  . Anxiety   . Closed fracture of right distal femur ([place]) 09/01/2015  . Depression   . ESRD (end stage renal disease) on dialysis [facility])    "TTS; [place]" (04/24/2017)  . GERD (gastroesophageal reflux disease) 04/23/2015  . HCAP (healthcare-associated pneumonia) 04/24/2017  . Heart murmur   . History of blood transfusion    "low HgB when I was going to dialysis center"  . Hypertension   . Hypertension associated with end stage renal disease on dialysis 03/11/2014  . Malnutrition of moderate degree 09/02/2015  . Multiple myeloma ([place]) 03/11/2014  . Patient is Jehov[employer]'s Witness    "I'd rather never have any blood transfusions unless absolutely necessary; please check with me 1st". (04/24/2017)  . Pneumonia ~ 2016  . Transfusion history    08/25/2018 hemoglobin 7.1,+ FOB, status post 2 units packed cells   Patient Active Problem List   Diagnosis Date Noted  . DNR (do not resuscitate)   . Acute encephalopathy 09/16/2018  . NSTEMI (non-ST elevated myocardial infarction) ([place]) 09/16/2018  . Prolonged QT interval 09/16/2018  . DNR  (do not resuscitate) discussion   . Palliative care by specialist   . End-stage renal disease on hemodialysis ([place])   . Hemodialysis catheter infection ([place])   . Anemia associated with chronic renal failure 08/26/2018  . Anemia 08/26/2018  . Hypothermia 08/26/2018  . Weakness generalized 08/25/2018  . Gram-negative bacteremia   . Acute lower UTI 08/12/2018  . Swelling of joint of left wrist 08/12/2018  . Rectal bleeding 08/12/2018  . Neurocognitive deficits 06/05/2018  . Altered mental status 05/28/2018  . Macrocytosis 05/01/2018  . Hypertension, uncontrolled 04/16/2018  . ESRD needing dialysis ([place]) 04/16/2018  . Encephalopathy acute 04/16/2018  . Anxiety and depression 04/16/2018  . Hypertension, accelerated 04/16/2018  . Pressure ulcer, stage I 04/16/2018  . Abnormal chest x-ray 12/26/2017  . HCAP (healthcare-associated pneumonia) 04/24/2017  . Sepsis ([place]) 04/24/2017  . Nausea and vomiting 04/24/2017  . Thrombocytopenia ([place]) 04/24/2017  . Hyperkalemia 01/16/2017  . IBS (irritable bowel syndrome) 12/27/2016  . Mood disorder ([place]) 07/19/2016  . Diastolic dysfunction 37/90/2409  . Insomnia 04/27/2016  . Hyponatremia 03/12/2016  . Diarrhea 03/12/2016  . Hypertensive urgency 02/18/2016  . Swelling of joint of left knee 02/18/2016  . Leukopenia due to antineoplastic chemotherapy ([place]) 09/03/2015  . Malnutrition of moderate degree 09/02/2015  . GERD (gastroesophageal reflux disease) 04/23/2015  . 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 Multiple myeloma ([place]) 03/11/2014  . Hypertension associatd with end stage renal disease on dialysis 03/11/2014   Home Medication(s) Prior to Admission medications   Medication  Sig Start Date End Date Taking? Authorizing Provider  acetaminophen (TYLENOL) 325 MG tablet Take 650 mg by mouth every 6 (six) hours as needed for fever or headache (pain). Do not exceed 3000 mg in 24 hours    [provider]  amLODipine (NORVASC) 10 MG  tablet Take 10 mg by mouth every evening. Hold for SBP <100MM/HG    [provider]  bisacodyl (DULCOLAX) 10 MG suppository Place 10 mg rectally daily as needed for moderate constipation (constipation not relieved by MOM).    [provider]  calcium acetate (PHOSLO) 667 MG capsule Take 1,334 mg by mouth 3 (three) times daily with meals.    [provider]  cloNIDine (CATAPRES) 0.1 MG tablet Take 0.1 mg by mouth every 8 (eight) hours as needed (sbp >180).     [provider]  hydrocortisone cream 1 % Apply 1 application topically daily as needed for itching.    [provider]  isosorbide mononitrate (IMDUR) 60 MG 24 hr tablet Take 60 mg by mouth daily.  03/24/15   [provider]  lamoTRIgine (LAMICTAL) 25 MG tablet Take 50 mg by mouth daily. Take 2 tablets to = 50 mg    [provider]  latanoprost (XALATAN) 0.005 % ophthalmic solution Place 1 drop into both eyes at bedtime.     [provider]  loperamide (IMODIUM A-D) 2 MG tablet Take 4 mg by mouth daily as needed (on Dialysis days).     [provider]  LORazepam (ATIVAN) 0.5 MG tablet Take 1 tablet (0.5 mg total) by mouth at bedtime. 09/19/18   Rai, Vernelle Emerald, MD  losartan (COZAAR) 50 MG tablet Take 50 mg by mouth every evening.    [provider]  multivitamin (RENA-VIT) TABS tablet Take 1 tablet by mouth daily.    [provider]  ranitidine (ZANTAC) 300 MG tablet Take 300 mg by mouth at bedtime. Reported on 03/05/2016    [provider]  sevelamer carbonate (RENVELA) 800 MG tablet Take 3 tablets (2,400 mg total) by mouth 3 (three) times daily with meals. 09/19/18   Rai, Vernelle Emerald, MD  simethicone (MYLICON) 975 MG chewable tablet Chew 125 mg by mouth daily. 6PM    [provider]  Sodium Phosphates (RA SALINE ENEMA) 19-7 GM/118ML ENEM Place 1 each rectally as needed (for constipation).    [provider]  valACYclovir  (VALTREX) 500 MG tablet Take 1 tablet (500 mg total) by mouth every other day. 03/01/16   Modena Jansky, MD                                                                                                                                    Past Surgical History Past Surgical History:  Procedure Laterality Date  . AV FISTULA PLACEMENT Left   . I&D EXTREMITY Left 02/27/2016   Procedure: ARTHROSCOPIC IRRIGATION AND DEBRIDEMENT EXTREMITY;  Surgeon:  Renette Butters, MD;  Location: [facility];  Service: Orthopedics;  Laterality: Left;  . IR DIALY SHUNT INTRO NEEDLE/INTRACATH INITIAL W/IMG LEFT Left 08/29/2018  . IR FLUORO GUIDE CV LINE RIGHT  08/29/2018  . IR US GUIDE VASC ACCESS RIGHT  08/29/2018  . TEE WITHOUT CARDIOVERSION N/A 02/29/2016   Procedure: TRANSESOPHAGEAL ECHOCARDIOGRAM (TEE);  Surgeon: Thayer Headings, MD;  Location[facility] ENDOSCOPY;  Service: Cardiovascular;  Laterality: N/A;   Family History Family History  Problem Relation Age of Onset  . Hypertension Mother   . Hypertension Father     Social History Social History   Tobacco Use  . Smoking status: Never Smoker  . Smokeless tobacco: Never Used  Substance Use Topics  . Alcohol use: No    Alcohol/week: 0.0 standard drinks  . Drug use: No   Allergies Phenergan [promethazine hcl]  Review of Systems Review of Systems  Unable to perform ROS: Mental status change    Physical Exam Vital Signs  I have reviewed the triage vital signs BP (!) 185/105   Pulse  68   Temp (!) 97.3 F (36.3 C) (Rectal)   Resp 16   SpO2 99%   Physical Exam Vitals signs reviewed.  Constitutional:      General: She is not in acute distress.    Appearance: She is well-developed. She is not diaphoretic.  HENT:     Head: Normocephalic and atraumatic.     Nose: Nose normal.  Eyes:     General: No scleral icterus.       Right eye: No discharge.        Left eye: No discharge.     Conjunctiva/sclera: Conjunctivae normal.     Pupils: Pupils are  equal, round, and reactive to light.  Neck:     Musculoskeletal: Normal range of motion and neck supple.  Cardiovascular:     Rate and Rhythm: Normal rate and regular rhythm.     Heart sounds: No murmur. No friction rub. No gallop.   Pulmonary:     Effort: Pulmonary effort is normal. No respiratory distress.     Breath sounds: Normal breath sounds. No stridor. No rales.  Chest:    Abdominal:     General: There is no distension.     Palpations: Abdomen is soft.     Tenderness: There is no abdominal tenderness.  Musculoskeletal:        General: No tenderness.  Skin:    General: Skin is warm and dry.     Findings: No erythema or rash.  Neurological:     Mental Status: She is alert. She is disoriented and confused.     Comments: Repeated mumbling. Unable to follow commands but moves all extremities with good strength.     ED Results and Treatments Labs (all labs ordered are listed, but only abnormal results are displayed) Labs Reviewed  COMPREHENSIVE METABOLIC PANEL - Abnormal; Notable for the following components:      Result Value   Sodium 131 (*)    Chloride 95 (*)    BUN 30 (*)    Creatinine, Ser 7.27 (*)    Total Protein 9.3 (*)    Albumin 2.2 (*)    Total Bilirubin 0.2 (*)    GFR calc non Af Amer 5 (*)    GFR calc Af Amer 6 (*)    All other components within normal limits  CBC WITH DIFFERENTIAL/PLATELET - Abnormal; Notable for the following components:   RBC 3.57 (*)  Hemoglobin 10.8 (*)    MCV 103.4 (*)    MCHC 29.3 (*)    RDW 18.6 (*)    Platelets 149 (*)    Lymphs Abs 0.5 (*)    All other components within normal limits  I-STAT VENOUS BLOOD GAS, ED - Abnormal; Notable for the following components:   pH, Ven 7.454 (*)    Bicarbonate 31.6 (*)    TCO2 33 (*)    Acid-Base Excess 7.0 (*)    All other components within normal limits  AMMONIA  URINALYSIS, COMPLETE (UACMP) WITH MICROSCOPIC  BLOOD GAS, VENOUS  CBG MONITORING, ED                                                                                                                          EKG  EKG Interpretation  Date/Time:  Friday October 03 2018 02:56:26 EST Ventricular Rate:  60 PR Interval:    QRS Duration: 99 QT Interval:  470 QTC Calculation: 470 R Axis:   -37 Text Interpretation:  Sinus rhythm Left axis deviation No significant change since last tracing Confirmed by Addison Lank [phone]) on 10/03/2018 3:16:38 AM      Radiology Ct Head Wo Contrast  Result Date: 10/03/2018 CLINICAL DATA:  Acute onset of altered mental status. EXAM: CT HEAD WITHOUT CONTRAST TECHNIQUE: Contiguous axial images were obtained from the base of the skull through the vertex without intravenous contrast. COMPARISON:  CT of the head and MRI of the brain performed 09/16/2018 FINDINGS: Brain: No evidence of acute infarction, hemorrhage, hydrocephalus, extra-axial collection or mass lesion / mass effect. Prominence of the ventricles and sulci reflects mild cortical volume loss. Mild cerebellar atrophy is noted. Scattered periventricular and subcortical white matter change likely reflects small vessel ischemic microangiopathy. Chronic ischemic change is noted at the basal ganglia bilaterally. A small chronic lacunar infarct is noted at the left cerebellar hemisphere. The brainstem and fourth ventricle are within normal limits. The cerebral hemispheres demonstrate grossly normal gray-white differentiation. No mass effect or midline shift is seen. Vascular: No hyperdense vessel or unexpected calcification. Skull: There is no evidence of fracture. Numerous large lytic lesions are noted throughout the visualized osseous structures, measuring up to 2.0 cm in size. These likely reflect the patient's known multiple myeloma. Sinuses/Orbits: The orbits are within normal limits. The paranasal sinuses and mastoid air cells are well-aerated. Other: No significant soft tissue abnormalities are seen. IMPRESSION: 1. No acute  intracranial pathology seen on CT. 2. Mild cortical volume loss and scattered small vessel ischemic microangiopathy. 3. Chronic ischemic change at the basal ganglia bilaterally. Small chronic lacunar infarct at the left cerebellar hemisphere. 4. Numerous large lytic lesions throughout the visualized osseous structures, measuring up to 2.0 cm in size. These likely reflect the patient's known multiple myeloma. Electronically Signed   By: Garald Balding M.D.   On: 10/03/2018 05:11   Pertinent labs & imaging results that were available during my care of the patient were reviewed by  me and considered in my medical decision making (see chart for details).  Medications Ordered in ED Medications - No data to display                                                                                                                                  Procedures Procedures  (including critical care time)  Medical Decision Making / ED Course I have reviewed the nursing notes for this encounter and the patient's prior records (if available in EHR or on provided paperwork).    Patient presents to the emergency department with altered mental status in the setting of missed dialysis.  This appears to be the same as her most recent admission for altered mental status.  At that time she was noted to be significantly hypertensive.  Initially during our assessment her blood pressures were in the 150s.  She did gradually trend up, requiring IV antihypertensives.  CT head was negative.  Labs without did not reveal emergent need for dialysis at this time.  BUN was mildly elevated.  Currently pending urinalysis.  Blood cultures were also drawn as she had her PermCath change during the last admission.  Will admit for further work up and management.  Final Clinical Impression(s) / ED Diagnoses Final diagnoses:  Transient alteration of awareness  Other secondary hypertension      This chart was dictated using voice  recognition software.  Despite best efforts to proofread,  errors can occur which can change the documentation meaning.   Fatima Blank, MD 10/03/18 [phone]

## 2018-10-03 NOTE — ED Notes (Signed)
Report given.

## 2018-10-04 ENCOUNTER — Inpatient Hospital Stay (HOSPITAL_COMMUNITY): Payer: Medicare Other

## 2018-10-04 DIAGNOSIS — Z8619 Personal history of other infectious and parasitic diseases: Secondary | ICD-10-CM

## 2018-10-04 DIAGNOSIS — R4182 Altered mental status, unspecified: Secondary | ICD-10-CM

## 2018-10-04 DIAGNOSIS — R7881 Bacteremia: Secondary | ICD-10-CM

## 2018-10-04 DIAGNOSIS — N186 End stage renal disease: Secondary | ICD-10-CM

## 2018-10-04 DIAGNOSIS — Z992 Dependence on renal dialysis: Secondary | ICD-10-CM

## 2018-10-04 DIAGNOSIS — C9000 Multiple myeloma not having achieved remission: Secondary | ICD-10-CM

## 2018-10-04 DIAGNOSIS — B9561 Methicillin susceptible Staphylococcus aureus infection as the cause of diseases classified elsewhere: Secondary | ICD-10-CM

## 2018-10-04 DIAGNOSIS — Z9115 Patient's noncompliance with renal dialysis: Secondary | ICD-10-CM

## 2018-10-04 LAB — BLOOD CULTURE ID PANEL (REFLEXED)
Acinetobacter baumannii: NOT DETECTED
Acinetobacter baumannii: NOT DETECTED
CANDIDA ALBICANS: NOT DETECTED
Candida albicans: NOT DETECTED
Candida glabrata: NOT DETECTED
Candida glabrata: NOT DETECTED
Candida krusei: NOT DETECTED
Candida krusei: NOT DETECTED
Candida parapsilosis: NOT DETECTED
Candida parapsilosis: NOT DETECTED
Candida tropicalis: NOT DETECTED
Candida tropicalis: NOT DETECTED
ENTEROBACTER CLOACAE COMPLEX: NOT DETECTED
ENTEROBACTER CLOACAE COMPLEX: NOT DETECTED
ENTEROBACTERIACEAE SPECIES: NOT DETECTED
ENTEROCOCCUS SPECIES: NOT DETECTED
ESCHERICHIA COLI: NOT DETECTED
ESCHERICHIA COLI: NOT DETECTED
Enterobacteriaceae species: NOT DETECTED
Enterococcus species: NOT DETECTED
Haemophilus influenzae: NOT DETECTED
Haemophilus influenzae: NOT DETECTED
Klebsiella oxytoca: NOT DETECTED
Klebsiella oxytoca: NOT DETECTED
Klebsiella pneumoniae: NOT DETECTED
Klebsiella pneumoniae: NOT DETECTED
Listeria monocytogenes: NOT DETECTED
Listeria monocytogenes: NOT DETECTED
Methicillin resistance: NOT DETECTED
Neisseria meningitidis: NOT DETECTED
Neisseria meningitidis: NOT DETECTED
PROTEUS SPECIES: NOT DETECTED
PSEUDOMONAS AERUGINOSA: NOT DETECTED
Proteus species: NOT DETECTED
Pseudomonas aeruginosa: NOT DETECTED
SERRATIA MARCESCENS: NOT DETECTED
STREPTOCOCCUS PNEUMONIAE: NOT DETECTED
Serratia marcescens: NOT DETECTED
Staphylococcus aureus (BCID): DETECTED — AB
Staphylococcus aureus (BCID): NOT DETECTED
Staphylococcus species: DETECTED — AB
Staphylococcus species: NOT DETECTED
Streptococcus agalactiae: NOT DETECTED
Streptococcus agalactiae: NOT DETECTED
Streptococcus pneumoniae: NOT DETECTED
Streptococcus pyogenes: NOT DETECTED
Streptococcus pyogenes: NOT DETECTED
Streptococcus species: NOT DETECTED
Streptococcus species: NOT DETECTED

## 2018-10-04 LAB — CBC WITH DIFFERENTIAL/PLATELET
Abs Immature Granulocytes: 0.03 10*3/uL (ref 0.00–0.07)
BASOS ABS: 0 10*3/uL (ref 0.0–0.1)
Basophils Relative: 0 %
Eosinophils Absolute: 0 10*3/uL (ref 0.0–0.5)
Eosinophils Relative: 1 %
HCT: 26.9 % — ABNORMAL LOW (ref 36.0–46.0)
Hemoglobin: 8 g/dL — ABNORMAL LOW (ref 12.0–15.0)
Immature Granulocytes: 1 %
Lymphocytes Relative: 12 %
Lymphs Abs: 0.6 10*3/uL — ABNORMAL LOW (ref 0.7–4.0)
MCH: 31.6 pg (ref 26.0–34.0)
MCHC: 29.7 g/dL — AB (ref 30.0–36.0)
MCV: 106.3 fL — ABNORMAL HIGH (ref 80.0–100.0)
Monocytes Absolute: 0.3 10*3/uL (ref 0.1–1.0)
Monocytes Relative: 7 %
NEUTROS PCT: 79 %
NRBC: 0.4 % — AB (ref 0.0–0.2)
Neutro Abs: 3.8 10*3/uL (ref 1.7–7.7)
Platelets: 193 10*3/uL (ref 150–400)
RBC: 2.53 MIL/uL — ABNORMAL LOW (ref 3.87–5.11)
RDW: 19.2 % — ABNORMAL HIGH (ref 11.5–15.5)
WBC: 4.7 10*3/uL (ref 4.0–10.5)

## 2018-10-04 LAB — BASIC METABOLIC PANEL
Anion gap: 12 (ref 5–15)
BUN: 39 mg/dL — ABNORMAL HIGH (ref 8–23)
CO2: 22 mmol/L (ref 22–32)
Calcium: 8.8 mg/dL — ABNORMAL LOW (ref 8.9–10.3)
Chloride: 97 mmol/L — ABNORMAL LOW (ref 98–111)
Creatinine, Ser: 8.38 mg/dL — ABNORMAL HIGH (ref 0.44–1.00)
GFR calc Af Amer: 5 mL/min — ABNORMAL LOW (ref >60)
GFR calc non Af Amer: 4 mL/min — ABNORMAL LOW (ref >60)
Glucose, Bld: 76 mg/dL (ref 70–99)
Potassium: 4.3 mmol/L (ref 3.5–5.1)
Sodium: 131 mmol/L — ABNORMAL LOW (ref 135–145)

## 2018-10-04 MED ORDER — DOXERCALCIFEROL 4 MCG/2ML IV SOLN
2.0000 ug | INTRAVENOUS | Status: DC
  Administered 2018-10-07: 2 ug via INTRAVENOUS
  Filled 2018-10-04: qty 2

## 2018-10-04 MED ORDER — SODIUM CHLORIDE 0.9 % IV SOLN
1.0000 g | INTRAVENOUS | Status: DC
  Administered 2018-10-04 – 2018-10-05 (×2): 1 g via INTRAVENOUS
  Filled 2018-10-04 (×2): qty 1

## 2018-10-04 MED ORDER — HEPARIN SODIUM (PORCINE) 1000 UNIT/ML DIALYSIS
1000.0000 [IU] | INTRAMUSCULAR | Status: DC | PRN
  Administered 2018-10-04: 3200 [IU] via INTRAVENOUS_CENTRAL
  Filled 2018-10-04: qty 1

## 2018-10-04 MED ORDER — SODIUM CHLORIDE 0.9 % IV SOLN: 100.0000 mL | INTRAVENOUS | Status: DC | PRN

## 2018-10-04 MED ORDER — HEPARIN SODIUM (PORCINE) 1000 UNIT/ML IJ SOLN
INTRAMUSCULAR | Status: AC
  Administered 2018-10-04: 3200 [IU] via INTRAVENOUS_CENTRAL
  Filled 2018-10-04: qty 4

## 2018-10-04 MED ORDER — ALTEPLASE 2 MG IJ SOLR: 2.0000 mg | Freq: Once | INTRAMUSCULAR | Status: DC | PRN

## 2018-10-04 MED ORDER — CEFAZOLIN SODIUM-DEXTROSE 2-4 GM/100ML-% IV SOLN
2.0000 g | INTRAVENOUS | Status: DC
  Administered 2018-10-04: 2 g via INTRAVENOUS
  Filled 2018-10-04: qty 100

## 2018-10-04 NOTE — Progress Notes (Signed)
Patient has order for Ativan & restraints. After patient becomes more relaxed she will be take to Head CT.  I spoke with patients daughter and asked if it was okay that we place her in restraints as well as give her medication to relax her for Head CT. Patient has been placed in low bed. We are following post fall algorithm.

## 2018-10-04 NOTE — Consult Note (Signed)
[facility] for Infectious Disease    Date of Admission:  10/03/2018   Total days of antibiotics: 1 zosyn                Reason for Consult: Staph bacteremia    Referring Provider: CHAMP   Assessment: Staph bacteremia Mental status change ESRD Multiple myeloma   Plan: 1. Will stop zosyn 2. Change to ancef with HD 3. Repeat BCx 4. Will hold on endocarditis w/u (no TEE given her comorbidities) 5. Would consider removing her HD cath  Comment- Pt has had multiple ED visits in last 4 months (7 times).  Would not pursue aggressive care.   Thank you so much for this interesting consult,  Active Problems:   Acute metabolic encephalopathy   . amLODipine  10 mg Oral QPM  . calcium acetate  1,334 mg Oral TID WC  . Chlorhexidine Gluconate Cloth  6 each Topical Q0600  . [START ON 10/07/2018] cloNIDine  0.2 mg Transdermal Weekly  . [START ON 10/07/2018] doxercalciferol  2 mcg Intravenous Q T,Th,Sa-HD  . feeding supplement (PRO-STAT SUGAR FREE 64)  30 mL Oral BID  . heparin injection (subcutaneous)  5,000 Units Subcutaneous Q8H  . isosorbide mononitrate  60 mg Oral Daily  . lamoTRIgine  50 mg Oral Daily  . latanoprost  1 drop Both Eyes QHS  . LORazepam  0.5 mg Oral QHS  . losartan  50 mg Oral QPM  . multivitamin  1 tablet Oral Daily  . valACYclovir  500 mg Oral QODAY    HPI: [name] is a 78 y.o. female with hx of myeloma, ESRD (recently refusing HD), and Achromobacter bacteremia 08-2018. She was treated with zosyn in hospital, then changed to [facility] with HD til 09-10-18. She was eval by Palliative care and daughter [facility]) wanted her to remain full code.  She was brought to ED last pm after mental change at SNF. She was noted to have HTN (179/106) in SNF and ED.  She has been afebrile in hospital.  She is now found to have MSSA bacteremia in 1/2 bottles so far.   Review of Systems: Review of Systems  Unable to perform ROS: Mental status change    Past  Medical History:  Diagnosis Date  . Anemia    from dx list from [facility]  . Anxiety   . Closed fracture of right distal femur ([place]) 09/01/2015  . Depression   . ESRD (end stage renal disease) on dialysis [facility])    "TTS; [place] [facility]" (04/24/2017)  . GERD (gastroesophageal reflux disease) 04/23/2015  . HCAP (healthcare-associated pneumonia) 04/24/2017  . Heart murmur   . History of blood transfusion    "low HgB when I was going to dialysis center"  . Hypertension   . Hypertension associated with end stage renal disease on dialysis 03/11/2014  . Malnutrition of moderate degree 09/02/2015  . Multiple myeloma ([place]) 03/11/2014  . Patient is Jehovah's Witness    "I'd rather never have any blood transfusions unless absolutely necessary; please check with me 1st". (04/24/2017)  . Pneumonia ~ 2016  . Transfusion history    08/25/2018 hemoglobin 7.1,+ FOB, status post 2 units packed cells    Social History   Tobacco Use  . Smoking status: Never Smoker  . Smokeless tobacco: Never Used  Substance Use Topics  . Alcohol use: No    Alcohol/week: 0.0 standard drinks  . Drug use: No    Family History  Problem Relation Age of Onset  . Hypertension Mother   . Hypertension Father      Medications:  Scheduled: . amLODipine  10 mg Oral QPM  . calcium acetate  1,334 mg Oral TID WC  . Chlorhexidine Gluconate Cloth  6 each Topical Q0600  . [START ON 10/07/2018] cloNIDine  0.2 mg Transdermal Weekly  . [START ON 10/07/2018] doxercalciferol  2 mcg Intravenous Q T,Th,Sa-HD  . feeding supplement (PRO-STAT SUGAR FREE 64)  30 mL Oral BID  . heparin injection (subcutaneous)  5,000 Units Subcutaneous Q8H  . isosorbide mononitrate  60 mg Oral Daily  . lamoTRIgine  50 mg Oral Daily  . latanoprost  1 drop Both Eyes QHS  . LORazepam  0.5 mg Oral QHS  . losartan  50 mg Oral QPM  . multivitamin  1 tablet Oral Daily  . valACYclovir  500 mg Oral QODAY    Abtx:  Anti-infectives (From admission, onward)    Start     Dose/Rate Route Frequency Ordered Stop   10/03/18 1400  valACYclovir (VALTREX) tablet 500 mg     500 mg Oral Every other day 10/03/18 0852     10/03/18 1330  piperacillin-tazobactam (ZOSYN) IVPB 3.375 g     3.375 g 12.5 mL/hr over 240 Minutes Intravenous Every 12 hours 10/03/18 1317     10/03/18 1315  piperacillin-tazobactam (ZOSYN) injection 2.25 g  Status:  Discontinued     2.25 g Intramuscular Every 12 hours 10/03/18 1305 10/03/18 1316        OBJECTIVE: Blood pressure (!) 149/98, pulse 81, temperature 97.8 F (36.6 C), temperature source Axillary, resp. rate 18, weight 57 kg, SpO2 95 %.  Physical Exam HENT:     Mouth/Throat:     Mouth: Mucous membranes are moist.     Pharynx: No oropharyngeal exudate.  Eyes:     Extraocular Movements: Extraocular movements intact.  Neck:     Musculoskeletal: Neck supple.  Cardiovascular:     Rate and Rhythm: Normal rate and regular rhythm.  Pulmonary:     Effort: Pulmonary effort is normal.     Breath sounds: Normal breath sounds.  Chest:    Abdominal:     General: Bowel sounds are normal.     Palpations: Abdomen is soft.     Tenderness: There is no abdominal tenderness.  Musculoskeletal:        General: No swelling.  Skin:      Neurological:     Mental Status: She is alert.     Lab Results Results for orders placed or performed during the hospital encounter of 10/03/18 (from the past 48 hour(s))  Comprehensive metabolic panel     Status: Abnormal   Collection Time: 10/03/18  3:25 AM  Result Value Ref Range   Sodium 131 (L) 135 - 145 mmol/L   Potassium 4.4 3.5 - 5.1 mmol/L   Chloride 95 (L) 98 - 111 mmol/L   CO2 29 22 - 32 mmol/L   Glucose, Bld 85 70 - 99 mg/dL   BUN 30 (H) 8 - 23 mg/dL   Creatinine, Ser 7.27 (H) 0.44 - 1.00 mg/dL   Calcium 9.4 8.9 - 10.3 mg/dL   Total Protein 9.3 (H) 6.5 - 8.1 g/dL   Albumin 2.2 (L) 3.5 - 5.0 g/dL   AST 18 15 - 41 U/L   ALT 8 0 - 44 U/L   Alkaline Phosphatase 49 38 - 126  U/L   Total Bilirubin 0.2 (L) 0.3 - 1.2 mg/dL  GFR calc non Af Amer 5 (L) >60 mL/min   GFR calc Af Amer 6 (L) >60 mL/min   Anion gap 7 5 - 15    Comment: Performed at [facility] [address], [place], [place] [address]  CBC WITH DIFFERENTIAL     Status: Abnormal   Collection Time: 10/03/18  3:25 AM  Result Value Ref Range   WBC 4.3 4.0 - 10.5 K/uL   RBC 3.57 (L) 3.87 - 5.11 MIL/uL   Hemoglobin 10.8 (L) 12.0 - 15.0 g/dL   HCT 36.9 36.0 - 46.0 %   MCV 103.4 (H) 80.0 - 100.0 fL   MCH 30.3 26.0 - 34.0 pg   MCHC 29.3 (L) 30.0 - 36.0 g/dL   RDW 18.6 (H) 11.5 - 15.5 %   Platelets 149 (L) 150 - 400 K/uL   nRBC 0.0 0.0 - 0.2 %   Neutrophils Relative % 81 %   Neutro Abs 3.5 1.7 - 7.7 K/uL   Lymphocytes Relative 12 %   Lymphs Abs 0.5 (L) 0.7 - 4.0 K/uL   Monocytes Relative 5 %   Monocytes Absolute 0.2 0.1 - 1.0 K/uL   Eosinophils Relative 1 %   Eosinophils Absolute 0.0 0.0 - 0.5 K/uL   Basophils Relative 0 %   Basophils Absolute 0.0 0.0 - 0.1 K/uL   Immature Granulocytes 1 %   Abs Immature Granulocytes 0.03 0.00 - 0.07 K/uL    Comment: Performed at [facility], [address] [place], [place] [address]  Ammonia     Status: None   Collection Time: 10/03/18  3:26 AM  Result Value Ref Range   Ammonia 19 9 - 35 umol/L    Comment: Performed at [facility], [place] [address] [place], [place] [address]  I-Stat venous blood gas, ED     Status: Abnormal   Collection Time: 10/03/18  3:43 AM  Result Value Ref Range   pH, Ven 7.454 (H) 7.250 - 7.430   pCO2, Ven 45.1 44.0 - 60.0 mmHg   pO2, Ven 32.0 32.0 - 45.0 mmHg   Bicarbonate 31.6 (H) 20.0 - 28.0 mmol/L   TCO2 33 (H) 22 - 32 mmol/L   O2 Saturation 64.0 %   Acid-Base Excess 7.0 (H) 0.0 - 2.0 mmol/L   Patient temperature HIDE    Sample type VENOUS    Comment NOTIFIED PHYSICIAN   CBG monitoring, ED     Status: None   Collection Time: 10/03/18  3:47 AM  Result Value Ref Range   Glucose-Capillary 83 70 - 99 mg/dL    Blood culture (routine x 2)     Status: None (Preliminary result)   Collection Time: 10/03/18  7:30 AM  Result Value Ref Range   Specimen Description BLOOD RIGHT HAND    Special Requests      BOTTLES DRAWN AEROBIC AND ANAEROBIC Blood Culture adequate volume Performed at [facility], [address] [place], [place] [address]    Culture NO GROWTH < 24 HOURS    Report Status PENDING   Blood culture (routine x 2)     Status: Abnormal (Preliminary result)   Collection Time: 10/03/18  7:33 AM  Result Value Ref Range   Specimen Description BLOOD RIGHT ANTECUBITAL    Special Requests      BOTTLES DRAWN AEROBIC AND ANAEROBIC Blood Culture results may not be optimal due to an inadequate volume of blood received in culture bottles   Culture  Setup  Time      GRAM POSITIVE COCCI AEROBIC BOTTLE ONLY Organism ID to follow CRITICAL RESULT CALLED TO, READ BACK BY AND VERIFIED WITHKarsten Ro [facility] [id number] 10/04/18 A BROWNING Performed at [facility], [place] [address], [place], [place] [address]    Culture STAPHYLOCOCCUS AUREUS (A)    Report Status PENDING   Blood Culture ID Panel (Reflexed)     Status: Abnormal   Collection Time: 10/03/18  7:33 AM  Result Value Ref Range   Enterococcus species NOT DETECTED NOT DETECTED   Listeria monocytogenes NOT DETECTED NOT DETECTED   Staphylococcus species DETECTED (A) NOT DETECTED    Comment: CRITICAL RESULT CALLED TO, READ BACK BY AND VERIFIED WITH: J [facility] PHARMD [id number] 10/04/18 A BROWNING    Staphylococcus aureus (BCID) DETECTED (A) NOT DETECTED    Comment: Methicillin (oxacillin) susceptible Staphylococcus aureus (MSSA). Preferred therapy is anti staphylococcal beta lactam antibiotic (Cefazolin or Nafcillin), unless clinically contraindicated. CRITICAL RESULT CALLED TO, READ BACK BY AND VERIFIED WITH: Karsten Ro PHARMD 0240 10/04/18 A BROWNING    Methicillin resistance NOT DETECTED NOT DETECTED   Streptococcus species NOT DETECTED NOT DETECTED    Streptococcus agalactiae NOT DETECTED NOT DETECTED   Streptococcus pneumoniae NOT DETECTED NOT DETECTED   Streptococcus pyogenes NOT DETECTED NOT DETECTED   Acinetobacter baumannii NOT DETECTED NOT DETECTED   Enterobacteriaceae species NOT DETECTED NOT DETECTED   Enterobacter cloacae complex NOT DETECTED NOT DETECTED   Escherichia coli NOT DETECTED NOT DETECTED   Klebsiella oxytoca NOT DETECTED NOT DETECTED   Klebsiella pneumoniae NOT DETECTED NOT DETECTED   Proteus species NOT DETECTED NOT DETECTED   Serratia marcescens NOT DETECTED NOT DETECTED   Haemophilus influenzae NOT DETECTED NOT DETECTED   Neisseria meningitidis NOT DETECTED NOT DETECTED   Pseudomonas aeruginosa NOT DETECTED NOT DETECTED   Candida albicans NOT DETECTED NOT DETECTED   Candida glabrata NOT DETECTED NOT DETECTED   Candida krusei NOT DETECTED NOT DETECTED   Candida parapsilosis NOT DETECTED NOT DETECTED   Candida tropicalis NOT DETECTED NOT DETECTED    Comment: Performed at [facility] [address], [place], [place] [address]  MRSA PCR Screening     Status: None   Collection Time: 10/03/18  6:46 PM  Result Value Ref Range   MRSA by PCR NEGATIVE NEGATIVE    Comment:        The GeneXpert MRSA Assay (FDA approved for NASAL specimens only), is one component of a comprehensive MRSA colonization surveillance program. It is not intended to diagnose MRSA infection nor to guide or monitor treatment for MRSA infections. Performed at [facility], [place] [address], [place], [place] [address]   CBC with Differential/Platelet     Status: Abnormal   Collection Time: 10/04/18  5:19 AM  Result Value Ref Range   WBC 4.7 4.0 - 10.5 K/uL   RBC 2.53 (L) 3.87 - 5.11 MIL/uL   Hemoglobin 8.0 (L) 12.0 - 15.0 g/dL    Comment: REPEATED TO VERIFY   HCT 26.9 (L) 36.0 - 46.0 %   MCV 106.3 (H) 80.0 - 100.0 fL   MCH 31.6 26.0 - 34.0 pg   MCHC 29.7 (L) 30.0 - 36.0 g/dL   RDW 19.2 (H) 11.5 - 15.5 %   Platelets  193 150 - 400 K/uL   nRBC 0.4 (H) 0.0 - 0.2 %   Neutrophils Relative % 79 %   Neutro Abs 3.8 1.7 - 7.7 K/uL   Lymphocytes Relative 12 %  Lymphs Abs 0.6 (L) 0.7 - 4.0 K/uL   Monocytes Relative 7 %   Monocytes Absolute 0.3 0.1 - 1.0 K/uL   Eosinophils Relative 1 %   Eosinophils Absolute 0.0 0.0 - 0.5 K/uL   Basophils Relative 0 %   Basophils Absolute 0.0 0.0 - 0.1 K/uL   Immature Granulocytes 1 %   Abs Immature Granulocytes 0.03 0.00 - 0.07 K/uL    Comment: Performed at [facility] [address], [place], [place] [address]  Basic metabolic panel     Status: Abnormal   Collection Time: 10/04/18  5:19 AM  Result Value Ref Range   Sodium 131 (L) 135 - 145 mmol/L   Potassium 4.3 3.5 - 5.1 mmol/L   Chloride 97 (L) 98 - 111 mmol/L   CO2 22 22 - 32 mmol/L   Glucose, Bld 76 70 - 99 mg/dL   BUN 39 (H) 8 - 23 mg/dL   Creatinine, Ser 8.38 (H) 0.44 - 1.00 mg/dL   Calcium 8.8 (L) 8.9 - 10.3 mg/dL   GFR calc non Af Amer 4 (L) >60 mL/min   GFR calc Af Amer 5 (L) >60 mL/min   Anion gap 12 5 - 15    Comment: Performed at [facility] [address], [place], [place] [address]      Component Value Date/Time   SDES BLOOD RIGHT ANTECUBITAL 10/03/2018 0733   SPECREQUEST  10/03/2018 7867    BOTTLES DRAWN AEROBIC AND ANAEROBIC Blood Culture results may not be optimal due to an inadequate volume of blood received in culture bottles   CULT STAPHYLOCOCCUS AUREUS (A) 10/03/2018 0733   REPTSTATUS PENDING 10/03/2018 0733   Ct Head Wo Contrast  Result Date: 10/04/2018 CLINICAL DATA:  Head trauma, ataxia EXAM: CT HEAD WITHOUT CONTRAST CT CERVICAL SPINE WITHOUT CONTRAST TECHNIQUE: Multidetector CT imaging of the head and cervical spine was performed following the standard protocol without intravenous contrast. Multiplanar CT image reconstructions of the cervical spine were also generated. COMPARISON:  Head CT yesterday at 0455 hour, brain MRI 09/16/2018. Cervical spine CT 07/26/2018 FINDINGS:  CT HEAD FINDINGS Brain: No intracranial hemorrhage, mass effect, or midline shift. No hydrocephalus. The basilar cisterns are patent. No evidence of territorial infarct or acute ischemia. No extra-axial or intracranial fluid collection. Stable degree of atrophy and chronic small vessel ischemia. Remote left cerebellar infarct. Vascular: Atherosclerosis of skullbase vasculature without hyperdense vessel or abnormal calcification. Skull: Innumerable skull lesions consistent with patient's history of myeloma, unchanged. No skull fracture. Sinuses/Orbits: No acute finding. Other: None. CT CERVICAL SPINE FINDINGS Alignment: Trace anterolisthesis of C2 on C3, unchanged. No traumatic subluxation. Skull base and vertebrae: No acute fracture. Multiple small lucencies consistent with multiple myeloma, unchanged from prior exam. No evidence of pathologic fracture. Skull base is intact. Soft tissues and spinal canal: No prevertebral fluid or swelling. No visible canal hematoma. Disc levels: Multilevel degenerative disc disease with disc space narrowing and endplate spurring, most prominent at C3-C4 and C6-C7. Multilevel facet arthropathy. Upper chest: Right internal jugular dialysis catheter. No acute findings. Other: Myelomatous lesions noted in the right clavicle, bilateral scapula,, sternum and multiple ribs. IMPRESSION: 1. No acute intracranial abnormality. No skull fracture. Stable atrophy and chronic small vessel ischemia. 2. Multilevel degenerative change in the cervical spine without acute fracture or subluxation. 3. Multiple osseous lesions in the calvarium and cervical spine consistent with known multiple myeloma. Electronically Signed   By: Keith Rake M.D.   On: 10/04/2018 02:32  Ct Head Wo Contrast  Result Date: 10/03/2018 CLINICAL DATA:  Acute onset of altered mental status. EXAM: CT HEAD WITHOUT CONTRAST TECHNIQUE: Contiguous axial images were obtained from the base of the skull through the vertex  without intravenous contrast. COMPARISON:  CT of the head and MRI of the brain performed 09/16/2018 FINDINGS: Brain: No evidence of acute infarction, hemorrhage, hydrocephalus, extra-axial collection or mass lesion / mass effect. Prominence of the ventricles and sulci reflects mild cortical volume loss. Mild cerebellar atrophy is noted. Scattered periventricular and subcortical white matter change likely reflects small vessel ischemic microangiopathy. Chronic ischemic change is noted at the basal ganglia bilaterally. A small chronic lacunar infarct is noted at the left cerebellar hemisphere. The brainstem and fourth ventricle are within normal limits. The cerebral hemispheres demonstrate grossly normal gray-white differentiation. No mass effect or midline shift is seen. Vascular: No hyperdense vessel or unexpected calcification. Skull: There is no evidence of fracture. Numerous large lytic lesions are noted throughout the visualized osseous structures, measuring up to 2.0 cm in size. These likely reflect the patient's known multiple myeloma. Sinuses/Orbits: The orbits are within normal limits. The paranasal sinuses and mastoid air cells are well-aerated. Other: No significant soft tissue abnormalities are seen. IMPRESSION: 1. No acute intracranial pathology seen on CT. 2. Mild cortical volume loss and scattered small vessel ischemic microangiopathy. 3. Chronic ischemic change at the basal ganglia bilaterally. Small chronic lacunar infarct at the left cerebellar hemisphere. 4. Numerous large lytic lesions throughout the visualized osseous structures, measuring up to 2.0 cm in size. These likely reflect the patient's known multiple myeloma. Electronically Signed   By: Garald Balding M.D.   On: 10/03/2018 05:11   Ct Cervical Spine Wo Contrast  Result Date: 10/04/2018 CLINICAL DATA:  Head trauma, ataxia EXAM: CT HEAD WITHOUT CONTRAST CT CERVICAL SPINE WITHOUT CONTRAST TECHNIQUE: Multidetector CT imaging of the head  and cervical spine was performed following the standard protocol without intravenous contrast. Multiplanar CT image reconstructions of the cervical spine were also generated. COMPARISON:  Head CT yesterday at 0455 hour, brain MRI 09/16/2018. Cervical spine CT 07/26/2018 FINDINGS: CT HEAD FINDINGS Brain: No intracranial hemorrhage, mass effect, or midline shift. No hydrocephalus. The basilar cisterns are patent. No evidence of territorial infarct or acute ischemia. No extra-axial or intracranial fluid collection. Stable degree of atrophy and chronic small vessel ischemia. Remote left cerebellar infarct. Vascular: Atherosclerosis of skullbase vasculature without hyperdense vessel or abnormal calcification. Skull: Innumerable skull lesions consistent with patient's history of myeloma, unchanged. No skull fracture. Sinuses/Orbits: No acute finding. Other: None. CT CERVICAL SPINE FINDINGS Alignment: Trace anterolisthesis of C2 on C3, unchanged. No traumatic subluxation. Skull base and vertebrae: No acute fracture. Multiple small lucencies consistent with multiple myeloma, unchanged from prior exam. No evidence of pathologic fracture. Skull base is intact. Soft tissues and spinal canal: No prevertebral fluid or swelling. No visible canal hematoma. Disc levels: Multilevel degenerative disc disease with disc space narrowing and endplate spurring, most prominent at C3-C4 and C6-C7. Multilevel facet arthropathy. Upper chest: Right internal jugular dialysis catheter. No acute findings. Other: Myelomatous lesions noted in the right clavicle, bilateral scapula,, sternum and multiple ribs. IMPRESSION: 1. No acute intracranial abnormality. No skull fracture. Stable atrophy and chronic small vessel ischemia. 2. Multilevel degenerative change in the cervical spine without acute fracture or subluxation. 3. Multiple osseous lesions in the calvarium and cervical spine consistent with known multiple myeloma. Electronically Signed   By:  Keith Rake M.D.   On: 10/04/2018 02:32  Recent Results (from the past 240 hour(s))  Blood culture (routine x 2)     Status: None (Preliminary result)   Collection Time: 10/03/18  7:30 AM  Result Value Ref Range Status   Specimen Description BLOOD RIGHT HAND  Final   Special Requests   Final    BOTTLES DRAWN AEROBIC AND ANAEROBIC Blood Culture adequate volume Performed at [facility], [address], [place], [place] [address]    Culture NO GROWTH < 24 HOURS  Final   Report Status PENDING  Incomplete  Blood culture (routine x 2)     Status: Abnormal (Preliminary result)   Collection Time: 10/03/18  7:33 AM  Result Value Ref Range Status   Specimen Description BLOOD RIGHT ANTECUBITAL  Final   Special Requests   Final    BOTTLES DRAWN AEROBIC AND ANAEROBIC Blood Culture results may not be optimal due to an inadequate volume of blood received in culture bottles   Culture  Setup Time   Final    GRAM POSITIVE COCCI AEROBIC BOTTLE ONLY Organism ID to follow CRITICAL RESULT CALLED TO, READ BACK BY AND VERIFIED WITHKarsten Ro PHARMD [id number] 10/04/18 A BROWNING Performed at [facility], [place] [address], [place], [place] [address]    Culture STAPHYLOCOCCUS AUREUS (A)  Final   Report Status PENDING  Incomplete  Blood Culture ID Panel (Reflexed)     Status: Abnormal   Collection Time: 10/03/18  7:33 AM  Result Value Ref Range Status   Enterococcus species NOT DETECTED NOT DETECTED Final   Listeria monocytogenes NOT DETECTED NOT DETECTED Final   Staphylococcus species DETECTED (A) NOT DETECTED Final    Comment: CRITICAL RESULT CALLED TO, READ BACK BY AND VERIFIED WITH: J [facility] PHARMD [id number] 10/04/18 A BROWNING    Staphylococcus aureus (BCID) DETECTED (A) NOT DETECTED Final    Comment: Methicillin (oxacillin) susceptible Staphylococcus aureus (MSSA). Preferred therapy is anti staphylococcal beta lactam antibiotic (Cefazolin or Nafcillin), unless clinically  contraindicated. CRITICAL RESULT CALLED TO, READ BACK BY AND VERIFIED WITH: Karsten Ro PHARMD 0240 10/04/18 A BROWNING    Methicillin resistance NOT DETECTED NOT DETECTED Final   Streptococcus species NOT DETECTED NOT DETECTED Final   Streptococcus agalactiae NOT DETECTED NOT DETECTED Final   Streptococcus pneumoniae NOT DETECTED NOT DETECTED Final   Streptococcus pyogenes NOT DETECTED NOT DETECTED Final   Acinetobacter baumannii NOT DETECTED NOT DETECTED Final   Enterobacteriaceae species NOT DETECTED NOT DETECTED Final   Enterobacter cloacae complex NOT DETECTED NOT DETECTED Final   Escherichia coli NOT DETECTED NOT DETECTED Final   Klebsiella oxytoca NOT DETECTED NOT DETECTED Final   Klebsiella pneumoniae NOT DETECTED NOT DETECTED Final   Proteus species NOT DETECTED NOT DETECTED Final   Serratia marcescens NOT DETECTED NOT DETECTED Final   Haemophilus influenzae NOT DETECTED NOT DETECTED Final   Neisseria meningitidis NOT DETECTED NOT DETECTED Final   Pseudomonas aeruginosa NOT DETECTED NOT DETECTED Final   Candida albicans NOT DETECTED NOT DETECTED Final   Candida glabrata NOT DETECTED NOT DETECTED Final   Candida krusei NOT DETECTED NOT DETECTED Final   Candida parapsilosis NOT DETECTED NOT DETECTED Final   Candida tropicalis NOT DETECTED NOT DETECTED Final    Comment: Performed at [facility], [place] [address], [place],  [address]  MRSA PCR Screening     Status: None   Collection Time: 10/03/18  6:46 PM  Result Value Ref Range Status   MRSA by PCR NEGATIVE NEGATIVE Final  Comment:        The GeneXpert MRSA Assay (FDA approved for NASAL specimens only), is one component of a comprehensive MRSA colonization surveillance program. It is not intended to diagnose MRSA infection nor to guide or monitor treatment for MRSA infections. Performed at [facility], [place] [address] [place], [place] [address]     Microbiology: Recent Results (from the past 240  hour(s))  Blood culture (routine x 2)     Status: None (Preliminary result)   Collection Time: 10/03/18  7:30 AM  Result Value Ref Range Status   Specimen Description BLOOD RIGHT HAND  Final   Special Requests   Final    BOTTLES DRAWN AEROBIC AND ANAEROBIC Blood Culture adequate volume Performed at [facility], [place] [address], [place], [place] [address]    Culture NO GROWTH < 24 HOURS  Final   Report Status PENDING  Incomplete  Blood culture (routine x 2)     Status: Abnormal (Preliminary result)   Collection Time: 10/03/18  7:33 AM  Result Value Ref Range Status   Specimen Description BLOOD RIGHT ANTECUBITAL  Final   Special Requests   Final    BOTTLES DRAWN AEROBIC AND ANAEROBIC Blood Culture results may not be optimal due to an inadequate volume of blood received in culture bottles   Culture  Setup Time   Final    GRAM POSITIVE COCCI AEROBIC BOTTLE ONLY Organism ID to follow CRITICAL RESULT CALLED TO, READ BACK BY AND VERIFIED WITHKarsten Ro PHARMD [id number] 10/04/18 A BROWNING Performed at [facility], [place] [address], [place], [place] [address]    Culture STAPHYLOCOCCUS AUREUS (A)  Final   Report Status PENDING  Incomplete  Blood Culture ID Panel (Reflexed)     Status: Abnormal   Collection Time: 10/03/18  7:33 AM  Result Value Ref Range Status   Enterococcus species NOT DETECTED NOT DETECTED Final   Listeria monocytogenes NOT DETECTED NOT DETECTED Final   Staphylococcus species DETECTED (A) NOT DETECTED Final    Comment: CRITICAL RESULT CALLED TO, READ BACK BY AND VERIFIED WITH: J [facility] PHARMD [id number] 10/04/18 A BROWNING    Staphylococcus aureus (BCID) DETECTED (A) NOT DETECTED Final    Comment: Methicillin (oxacillin) susceptible Staphylococcus aureus (MSSA). Preferred therapy is anti staphylococcal beta lactam antibiotic (Cefazolin or Nafcillin), unless clinically contraindicated. CRITICAL RESULT CALLED TO, READ BACK BY AND VERIFIED WITH: Karsten Ro PHARMD 0240 10/04/18  A BROWNING    Methicillin resistance NOT DETECTED NOT DETECTED Final   Streptococcus species NOT DETECTED NOT DETECTED Final   Streptococcus agalactiae NOT DETECTED NOT DETECTED Final   Streptococcus pneumoniae NOT DETECTED NOT DETECTED Final   Streptococcus pyogenes NOT DETECTED NOT DETECTED Final   Acinetobacter baumannii NOT DETECTED NOT DETECTED Final   Enterobacteriaceae species NOT DETECTED NOT DETECTED Final   Enterobacter cloacae complex NOT DETECTED NOT DETECTED Final   Escherichia coli NOT DETECTED NOT DETECTED Final   Klebsiella oxytoca NOT DETECTED NOT DETECTED Final   Klebsiella pneumoniae NOT DETECTED NOT DETECTED Final   Proteus species NOT DETECTED NOT DETECTED Final   Serratia marcescens NOT DETECTED NOT DETECTED Final   Haemophilus influenzae NOT DETECTED NOT DETECTED Final   Neisseria meningitidis NOT DETECTED NOT DETECTED Final   Pseudomonas aeruginosa NOT DETECTED NOT DETECTED Final   Candida albicans NOT DETECTED NOT DETECTED Final   Candida glabrata NOT DETECTED NOT DETECTED Final   Candida krusei NOT DETECTED NOT DETECTED Final   Candida parapsilosis  NOT DETECTED NOT DETECTED Final   Candida tropicalis NOT DETECTED NOT DETECTED Final    Comment: Performed at [facility], [place] [address], [place], [place] [address]  MRSA PCR Screening     Status: None   Collection Time: 10/03/18  6:46 PM  Result Value Ref Range Status   MRSA by PCR NEGATIVE NEGATIVE Final    Comment:        The GeneXpert MRSA Assay (FDA approved for NASAL specimens only), is one component of a comprehensive MRSA colonization surveillance program. It is not intended to diagnose MRSA infection nor to guide or monitor treatment for MRSA infections. Performed at [facility], [place] [address], [place], [place] [address]     Radiographs and labs were personally reviewed by me.        [place] [facility] Management Team Staphylococcus aureus bacteremia   Staphylococcus  aureus bacteremia (SAB) is associated with a high rate of complications and mortality.  Specific aspects of clinical management are critical to optimizing the outcome of patients with SAB.  Therefore, the [facility] Antimicrobial Management Team [facility]) has initiated an intervention aimed at improving the management of SAB at [facility].  To do so, Infectious Diseases physicians are providing an evidence-based consult for the management of all patients with SAB.     Yes No Comments  Perform follow-up blood cultures (even if the patient is afebrile) to ensure clearance of bacteremia [x] []   Remove vascular catheter and obtain follow-up blood cultures after the removal of the catheter [] []   Perform echocardiography to evaluate for endocarditis (transthoracic ECHO is 40-50% sensitive, TEE is > 90% sensitive) [] [] Please keep in mind, that neither test can definitively EXCLUDE endocarditis, and that should clinical suspicion remain high for endocarditis the patient should then still be treated with an "endocarditis" duration of therapy = 6 weeks  Consult electrophysiologist to evaluate implanted cardiac device (pacemaker, ICD) [] []   Ensure source control [] [] Have all abscesses been drained effectively? Have deep seeded infections (septic joints or osteomyelitis) had appropriate surgical debridement?  Investigate for "metastatic" sites of infection [] [] Does the patient have ANY symptom or physical exam finding that would suggest a deeper infection (back or neck pain that may be suggestive of vertebral osteomyelitis or epidural abscess, muscle pain that could be a symptom of pyomyositis)?  Keep in mind that for deep seeded infections MRI imaging with contrast is preferred rather than other often insensitive tests such as plain x-rays, especially early in a patient's presentation.  Change antibiotic therapy to __________________ [x] [] Beta-lactam antibiotics are preferred for MSSA due to higher  cure rates.   If on Vancomycin, goal trough should be 15 - 20 mcg/mL  Estimated duration of IV antibiotic therapy:   [] [] Consult case management for probably prolonged outpatient IV antibiotic therapy     Bobby Rumpf, MD [facility] for Infectious [place] [phone] 10/04/2018, 12:31 PM

## 2018-10-04 NOTE — Progress Notes (Signed)
Pharmacy Antibiotic Note  [name] is a 78 y.o. female admitted on 10/03/2018 with MSSA bacteremia. Patient with ESRD on HD TTS, currently on schedule. Pharmacy has been consulted for cefazolin dosing. Per ID, due to comorbidities and multiple recent ED visits, will hold off on endocarditis work-up.  Plan: Cefazolin 2 g IV qTTS after HD  Monitor HD schedule, repeat cultures, clinical improvement  Weight: 125 lb 10.6 oz (57 kg)  Temp (24hrs), Avg:98.1 F (36.7 C), Min:97.3 F (36.3 C), Max:98.8 F (37.1 C)  Recent Labs  Lab 10/03/18 0325 10/04/18 0519  WBC 4.3 4.7  CREATININE 7.27* 8.38*    Estimated Creatinine Clearance: 4.9 mL/min (A) (by C-G formula based on SCr of 8.38 mg/dL (H)).    Allergies  Allergen Reactions  . Phenergan [Promethazine Hcl] Other (See Comments)    Acute encephalopathy in the context of refusal to go to hemodialysis and administration of Phenergan for nausea and vomiting    Antimicrobials this admission: Cefazolin 1/4 >>   Dose adjustments this admission:   Microbiology results: 1/3 BCx: MSSA 1/3 MRSA PCR: negative  Thank you for allowing pharmacy to be a part of this patient's care.  Jackson Latino, PharmD PGY1 Pharmacy Resident Phone [phone] 10/04/2018     1:16 PM

## 2018-10-04 NOTE — Progress Notes (Signed)
PHARMACY - PHYSICIAN COMMUNICATION CRITICAL VALUE ALERT - BLOOD CULTURE IDENTIFICATION (BCID)  [name] is an 78 y.o. female who presented to [facility] on 10/03/2018 with a chief complaint of refusing HD at nursing home  Assessment:  Recently finished Zosyn at nursing facility, pt has hx of multi-drug resistant Achromobacter bacteremia, now with MSSA in cultures  Name of physician (or Provider) Contacted: K Schorr  Current antibiotics: Zosyn  Changes to prescribed antibiotics recommended:  -Will continue Zosyn for now -Consider narrowing to Ancef if no longer worried about the Achromobacter -ID will see later given staph aureus bacteremia   Results for orders placed or performed during the hospital encounter of 10/03/18  Blood Culture ID Panel (Reflexed) (Collected: 10/03/2018  7:33 AM)  Result Value Ref Range   Enterococcus species NOT DETECTED NOT DETECTED   Listeria monocytogenes NOT DETECTED NOT DETECTED   Staphylococcus species DETECTED (A) NOT DETECTED   Staphylococcus aureus (BCID) DETECTED (A) NOT DETECTED   Methicillin resistance NOT DETECTED NOT DETECTED   Streptococcus species NOT DETECTED NOT DETECTED   Streptococcus agalactiae NOT DETECTED NOT DETECTED   Streptococcus pneumoniae NOT DETECTED NOT DETECTED   Streptococcus pyogenes NOT DETECTED NOT DETECTED   Acinetobacter baumannii NOT DETECTED NOT DETECTED   Enterobacteriaceae species NOT DETECTED NOT DETECTED   Enterobacter cloacae complex NOT DETECTED NOT DETECTED   Escherichia coli NOT DETECTED NOT DETECTED   Klebsiella oxytoca NOT DETECTED NOT DETECTED   Klebsiella pneumoniae NOT DETECTED NOT DETECTED   Proteus species NOT DETECTED NOT DETECTED   Serratia marcescens NOT DETECTED NOT DETECTED   Haemophilus influenzae NOT DETECTED NOT DETECTED   Neisseria meningitidis NOT DETECTED NOT DETECTED   Pseudomonas aeruginosa NOT DETECTED NOT DETECTED   Candida albicans NOT DETECTED NOT DETECTED   Candida glabrata NOT  DETECTED NOT DETECTED   Candida krusei NOT DETECTED NOT DETECTED   Candida parapsilosis NOT DETECTED NOT DETECTED   Candida tropicalis NOT DETECTED NOT DETECTED    Narda Bonds 10/04/2018  3:36 AM

## 2018-10-04 NOTE — Progress Notes (Addendum)
   10/03/18 2001  What Happened  Was fall witnessed? No  Was patient injured? Unsure  Patient found on floor  Found by Staff-comment (hospital staff)  Stated prior activity other (comment) (bedridden, patient was sleeping at [place] during shift change )  Follow Up  MD notified Schorr  Time MD notified 2020  Additional tests Yes-comment (Head CT)  Simple treatment Other (comment) (neck collar applied-ortho tech present)  Progress note created (see row info) Yes  Adult Fall Risk Assessment  Risk Factor Category (scoring not indicated) Not Applicable  Age 78  Fall History: Fall within 6 months prior to admission 0  Elimination; Bowel and/or Urine Incontinence 2  Elimination; Bowel and/or Urine Urgency/Frequency 0  Medications: includes PCA/Opiates, Anti-convulsants, Anti-hypertensives, Diuretics, Hypnotics, Laxatives, Sedatives, and Psychotropics 5  Patient Care Equipment 1  Mobility-Assistance 2  Mobility-Gait 2  Mobility-Sensory Deficit 0  Altered awareness of immediate physical environment 0  Impulsiveness 0  Lack of understanding of one's physical/cognitive limitations 4  Total Score 18  Patient Fall Risk Level High fall risk  Adult Fall Risk Interventions  Required Bundle Interventions *See Row Information* High fall risk - low, moderate, and high requirements implemented  Additional Interventions Use of appropriate toileting equipment (bedpan, BSC, etc.)  Screening for Fall Injury Risk (To be completed on HIGH fall risk patients) - Assessing Need for Low Bed  Risk For Fall Injury- Low Bed Criteria None identified - Continue screening  Screening for Fall Injury Risk (To be completed on HIGH fall risk patients who do not meet crieteria for Low Bed) - Assessing Need for Floor Mats Only  Risk For Fall Injury- Criteria for Floor Mats None identified - No additional interventions needed  Vitals  BP (!) 167/106  MAP (mmHg) 126  BP Method Automatic  Pulse Rate 91  Pulse Rate  Source Monitor  Oxygen Therapy  SpO2 100 %  O2 Device Room Air  Neurological  Neuro (WDL) X  Level of Consciousness Alert  Orientation Level Oriented to person  Cognition Appropriate at baseline  Pupil Assessment  Yes  R Pupil Size (mm) 2  R Pupil Shape Round  R Pupil Reaction Sluggish  L Pupil Size (mm) 2  L Pupil Shape Round  L Pupil Reaction Sluggish  Additional Pupil Assessments No  Facial Symmetry Asymmetrical right  RUE Motor Response Purposeful movement  LUE Motor Response Purposeful movement  RLE Motor Response Purposeful movement  LLE Motor Response Purposeful movement  Musculoskeletal  Musculoskeletal (WDL) X  Assistive Device None  Generalized Weakness Yes  Weight Bearing Restrictions No  Integumentary  Integumentary (WDL) X  Skin Color Appropriate for ethnicity  Skin Condition Dry  Skin Integrity MASD  Moisture Associated Skin Damage Location Groin

## 2018-10-04 NOTE — Progress Notes (Addendum)
PROGRESS NOTE  [name] EMV:[id number] DOB: 1941/04/09 DOA: 10/03/2018 PCP: Hendricks Limes, MD  HPI/Recap of past 24 hours: [name] is a 78 y.o. female with medical history significant for end-stage renal disease on hemodialysis Monday Wednesday Friday, multiple myeloma, dementia, hypertension, chronic diastolic CHF, chronic hyponatremia, ambulatory dysfunction wheelchair-bound who presented to [facility] ED from SNF due to sudden change in behavior last night.  Patient is unable to provide a history due to altered mental status.  History is obtained from her nursing facility and from medical records.  Per RN at SNF around 11 PM last night patient became suddenly confused, pulling out her IVs being uncooperative and aggressive.  No recent changes in her medications.  Blood pressure was excessively elevated.  Patient was brought to the ED for further evaluation.  Per RN patient has had intermittent change in behavior in the last few months but just not as severe.   Patient has been refusing hemodialysis.  Last hemodialysis was last Wednesday.  Recently discharged on IV Zosyn for Achromobacter bacteremia.  [facility] asked to admit.  10/04/2018: Patient seen and examined at her bedside in the dialysis center.  She is alert and confused.  She has been pulling at her IV and attempted to get out of bed.  She had a fall overnight.  CT head and neck revealed no acute intracranial findings, fracture or subluxation.  Assessment/Plan: Active Problems:   Acute metabolic encephalopathy  Acute metabolic encephalopathy possibly secondary to emergency hypertension versus MSSA bacteremia Presented with altered mental status pulling at IV and being aggressive Fall overnight CT head and neck unremarkable for any acute findings Fall precautions Reorient as needed Treat underlying problem  MSSA bacteremia Blood cultures drawn on admission revealed positive MSSA 1 out of 2 bottles Infectious disease  following Repeat blood cultures x2 peripherally DC Zosyn Start Ancef at dialysis Monday Wednesday Friday Hold off endocarditis work-up given her comorbidities Defer HD cath removal to nephrology Monitor fever curve and WBC  Hyponatremia Sodium 131 Electrolytes and volume status per nephrology Repeat BMP in the morning  Anemia of chronic disease Hemoglobin dropped from 10.8-8.0 Repeat H&H  Hypertensive emergency Presented with diastolic blood pressures in the 150's and altered mental status Resume home antihypertensive medications IV hydralazine 10 mg every 6 hours as needed for systolic blood pressure greater than 530 or diastolic greater than 051  End-stage renal disease on hemodialysis Monday Wednesday Friday Patient has refused hemodialysis multiple times recently Last hemodialysis was last Wednesday Nephrology consulted for hemodialysis Monitor electrolytes  Multiple myeloma Patient undergoes radiation once a week Follows with oncology outpatient  Chronic hyponatremia, appears euvolemic Sodium 131 Electrolytes and volume status managed by nephrology Repeat BMP in the morning  Recent history of bacteremia Blood cultures drawn on 08/25/2018 showed multi- drug-resistant Acromobacter On IV Zosyn per her RN at SNF  Chronic diastolic CHF Last 2D echo done on 08/27/2018 revealed preserved LVEF and grade 1 diastolic dysfunction Resume cardiac medications  Moderate pulmonary hypertension Pulmonary arterial pressure 44 mmHg No evidence of vegetation Follows with cardiology outpatient  Risks: Patient is high risk for the compensation due to acute metabolic encephalopathy suspect secondary to hypertensive emergency, missing sessions of hemodialysis, multiple comorbidities and advanced age.  Patient will require at least 2 midnights for further evaluation and treatment of present condition.  DVT prophylaxis: Subcu heparin 5000 units 3 times daily  Code Status:  Full code until otherwise noted by family  Family Communication: Called daughter multiple times to  update and left messages.  Disposition Plan:  Possibly back to SNF when hemodynamically stable  Consults called: Nephrology  Admission status: Inpatient status     Objective: Vitals:   10/04/18 1030 10/04/18 1100 10/04/18 1115 10/04/18 1149  BP: 121/71 124/71 (!) 144/97 (!) 149/98  Pulse: 81 80 83 80  Resp: '18 16 18 18  ' Temp:   (!) 97 F (36.1 C) 97.8 F (36.6 C)  TempSrc:   Oral Axillary  SpO2:   95% 95%  Weight:   54.9 kg     Intake/Output Summary (Last 24 hours) at 10/04/2018 1521 Last data filed at 10/04/2018 1153 Gross per 24 hour  Intake 151.25 ml  Output 2218 ml  Net -2066.75 ml   Filed Weights   10/03/18 1621 10/04/18 0745 10/04/18 1115  Weight: 53.9 kg 57 kg 54.9 kg    Exam:  . General: 78 y.o. year-old female well developed well nourished in no acute distress.  Alert and confused in the setting of advanced dementia. . Cardiovascular: Regular rate and rhythm with no rubs or gallops.  No thyromegaly or JVD noted.   Marland Kitchen Respiratory: Clear to auscultation with no wheezes or rales. Good inspiratory effort. . Abdomen: Soft nontender nondistended with normal bowel sounds x4 quadrants. . Musculoskeletal: No lower extremity edema. 2/4 pulses in all 4 extremities. Marland Kitchen Psychiatry: Unable to assess mood due to altered mental status.   Data Reviewed: CBC: Recent Labs  Lab 10/03/18 0325 10/04/18 0519  WBC 4.3 4.7  NEUTROABS 3.5 3.8  HGB 10.8* 8.0*  HCT 36.9 26.9*  MCV 103.4* 106.3*  PLT 149* 756   Basic Metabolic Panel: Recent Labs  Lab 10/03/18 0325 10/04/18 0519  NA 131* 131*  K 4.4 4.3  CL 95* 97*  CO2 29 22  GLUCOSE 85 76  BUN 30* 39*  CREATININE 7.27* 8.38*  CALCIUM 9.4 8.8*   GFR: Estimated Creatinine Clearance: 4.7 mL/min (A) (by C-G formula based on SCr of 8.38 mg/dL (H)). Liver Function Tests: Recent Labs  Lab 10/03/18 0325  AST 18   ALT 8  ALKPHOS 49  BILITOT 0.2*  PROT 9.3*  ALBUMIN 2.2*   No results for input(s): LIPASE, AMYLASE in the last 168 hours. Recent Labs  Lab 10/03/18 0326  AMMONIA 19   Coagulation Profile: No results for input(s): INR, PROTIME in the last 168 hours. Cardiac Enzymes: No results for input(s): CKTOTAL, CKMB, CKMBINDEX, TROPONINI in the last 168 hours. BNP (last 3 results) No results for input(s): PROBNP in the last 8760 hours. HbA1C: No results for input(s): HGBA1C in the last 72 hours. CBG: Recent Labs  Lab 10/03/18 0347  GLUCAP 83   Lipid Profile: No results for input(s): CHOL, HDL, LDLCALC, TRIG, CHOLHDL, LDLDIRECT in the last 72 hours. Thyroid Function Tests: No results for input(s): TSH, T4TOTAL, FREET4, T3FREE, THYROIDAB in the last 72 hours. Anemia Panel: No results for input(s): VITAMINB12, FOLATE, FERRITIN, TIBC, IRON, RETICCTPCT in the last 72 hours. Urine analysis:    Component Value Date/Time   COLORURINE STRAW (A) 09/16/2018 0326   APPEARANCEUR CLEAR 09/16/2018 0326   LABSPEC 1.010 09/16/2018 0326   PHURINE 9.0 (H) 09/16/2018 0326   GLUCOSEU 50 (A) 09/16/2018 0326   HGBUR NEGATIVE 09/16/2018 0326   BILIRUBINUR NEGATIVE 09/16/2018 0326   KETONESUR 5 (A) 09/16/2018 0326   PROTEINUR 100 (A) 09/16/2018 0326   UROBILINOGEN 0.2 04/10/2015 1800   NITRITE NEGATIVE 09/16/2018 0326   LEUKOCYTESUR NEGATIVE 09/16/2018 0326   Sepsis Labs: '@LABRCNTIP' (procalcitonin:4,lacticidven:4)  )  Recent Results (from the past 240 hour(s))  Blood culture (routine x 2)     Status: None (Preliminary result)   Collection Time: 10/03/18  7:30 AM  Result Value Ref Range Status   Specimen Description BLOOD RIGHT HAND  Final   Special Requests   Final    BOTTLES DRAWN AEROBIC AND ANAEROBIC Blood Culture adequate volume   Culture  Setup Time   Final    GRAM NEGATIVE RODS AEROBIC BOTTLE ONLY Organism ID to follow    Culture   Final    NO GROWTH 1 DAY Performed at [facility], [place] [address], [place], [place] [address]    Report Status PENDING  Incomplete  Blood culture (routine x 2)     Status: Abnormal (Preliminary result)   Collection Time: 10/03/18  7:33 AM  Result Value Ref Range Status   Specimen Description BLOOD RIGHT ANTECUBITAL  Final   Special Requests   Final    BOTTLES DRAWN AEROBIC AND ANAEROBIC Blood Culture results may not be optimal due to an inadequate volume of blood received in culture bottles   Culture  Setup Time   Final    GRAM POSITIVE COCCI AEROBIC BOTTLE ONLY Organism ID to follow CRITICAL RESULT CALLED TO, READ BACK BY AND VERIFIED WITHKarsten Ro PHARMD [id number] 10/04/18 A BROWNING Performed at [facility], [place] [address] [place], [place] [address]    Culture STAPHYLOCOCCUS AUREUS (A)  Final   Report Status PENDING  Incomplete  Blood Culture ID Panel (Reflexed)     Status: Abnormal   Collection Time: 10/03/18  7:33 AM  Result Value Ref Range Status   Enterococcus species NOT DETECTED NOT DETECTED Final   Listeria monocytogenes NOT DETECTED NOT DETECTED Final   Staphylococcus species DETECTED (A) NOT DETECTED Final    Comment: CRITICAL RESULT CALLED TO, READ BACK BY AND VERIFIED WITH: [facility] PHARMD 0240 10/04/18 A BROWNING    Staphylococcus aureus (BCID) DETECTED (A) NOT DETECTED Final    Comment: Methicillin (oxacillin) susceptible Staphylococcus aureus (MSSA). Preferred therapy is anti staphylococcal beta lactam antibiotic (Cefazolin or Nafcillin), unless clinically contraindicated. CRITICAL RESULT CALLED TO, READ BACK BY AND VERIFIED WITH: Karsten Ro PHARMD 0240 10/04/18 A BROWNING    Methicillin resistance NOT DETECTED NOT DETECTED Final   Streptococcus species NOT DETECTED NOT DETECTED Final   Streptococcus agalactiae NOT DETECTED NOT DETECTED Final   Streptococcus pneumoniae NOT DETECTED NOT DETECTED Final   Streptococcus pyogenes NOT DETECTED NOT DETECTED Final   Acinetobacter baumannii NOT DETECTED NOT  DETECTED Final   Enterobacteriaceae species NOT DETECTED NOT DETECTED Final   Enterobacter cloacae complex NOT DETECTED NOT DETECTED Final   Escherichia coli NOT DETECTED NOT DETECTED Final   Klebsiella oxytoca NOT DETECTED NOT DETECTED Final   Klebsiella pneumoniae NOT DETECTED NOT DETECTED Final   Proteus species NOT DETECTED NOT DETECTED Final   Serratia marcescens NOT DETECTED NOT DETECTED Final   Haemophilus influenzae NOT DETECTED NOT DETECTED Final   Neisseria meningitidis NOT DETECTED NOT DETECTED Final   Pseudomonas aeruginosa NOT DETECTED NOT DETECTED Final   Candida albicans NOT DETECTED NOT DETECTED Final   Candida glabrata NOT DETECTED NOT DETECTED Final   Candida krusei NOT DETECTED NOT DETECTED Final   Candida parapsilosis NOT DETECTED NOT DETECTED Final   Candida tropicalis NOT DETECTED NOT DETECTED Final    Comment: Performed at [facility], [place] [address], [place], [place] [address]  MRSA PCR Screening  Status: None   Collection Time: 10/03/18  6:46 PM  Result Value Ref Range Status   MRSA by PCR NEGATIVE NEGATIVE Final    Comment:        The GeneXpert MRSA Assay (FDA approved for NASAL specimens only), is one component of a comprehensive MRSA colonization surveillance program. It is not intended to diagnose MRSA infection nor to guide or monitor treatment for MRSA infections. Performed at [facility], [place] [address] [place],  [address]       Studies: Ct Head Wo Contrast  Result Date: 10/04/2018 CLINICAL DATA:  Head trauma, ataxia EXAM: CT HEAD WITHOUT CONTRAST CT CERVICAL SPINE WITHOUT CONTRAST TECHNIQUE: Multidetector CT imaging of the head and cervical spine was performed following the standard protocol without intravenous contrast. Multiplanar CT image reconstructions of the cervical spine were also generated. COMPARISON:  Head CT yesterday at 0455 hour, brain MRI 09/16/2018. Cervical spine CT 07/26/2018 FINDINGS: CT HEAD FINDINGS  Brain: No intracranial hemorrhage, mass effect, or midline shift. No hydrocephalus. The basilar cisterns are patent. No evidence of territorial infarct or acute ischemia. No extra-axial or intracranial fluid collection. Stable degree of atrophy and chronic small vessel ischemia. Remote left cerebellar infarct. Vascular: Atherosclerosis of skullbase vasculature without hyperdense vessel or abnormal calcification. Skull: Innumerable skull lesions consistent with patient's history of myeloma, unchanged. No skull fracture. Sinuses/Orbits: No acute finding. Other: None. CT CERVICAL SPINE FINDINGS Alignment: Trace anterolisthesis of C2 on C3, unchanged. No traumatic subluxation. Skull base and vertebrae: No acute fracture. Multiple small lucencies consistent with multiple myeloma, unchanged from prior exam. No evidence of pathologic fracture. Skull base is intact. Soft tissues and spinal canal: No prevertebral fluid or swelling. No visible canal hematoma. Disc levels: Multilevel degenerative disc disease with disc space narrowing and endplate spurring, most prominent at C3-C4 and C6-C7. Multilevel facet arthropathy. Upper chest: Right internal jugular dialysis catheter. No acute findings. Other: Myelomatous lesions noted in the right clavicle, bilateral scapula,, sternum and multiple ribs. IMPRESSION: 1. No acute intracranial abnormality. No skull fracture. Stable atrophy and chronic small vessel ischemia. 2. Multilevel degenerative change in the cervical spine without acute fracture or subluxation. 3. Multiple osseous lesions in the calvarium and cervical spine consistent with known multiple myeloma. Electronically Signed   By: Keith Rake M.D.   On: 10/04/2018 02:32   Ct Cervical Spine Wo Contrast  Result Date: 10/04/2018 CLINICAL DATA:  Head trauma, ataxia EXAM: CT HEAD WITHOUT CONTRAST CT CERVICAL SPINE WITHOUT CONTRAST TECHNIQUE: Multidetector CT imaging of the head and cervical spine was performed following  the standard protocol without intravenous contrast. Multiplanar CT image reconstructions of the cervical spine were also generated. COMPARISON:  Head CT yesterday at 0455 hour, brain MRI 09/16/2018. Cervical spine CT 07/26/2018 FINDINGS: CT HEAD FINDINGS Brain: No intracranial hemorrhage, mass effect, or midline shift. No hydrocephalus. The basilar cisterns are patent. No evidence of territorial infarct or acute ischemia. No extra-axial or intracranial fluid collection. Stable degree of atrophy and chronic small vessel ischemia. Remote left cerebellar infarct. Vascular: Atherosclerosis of skullbase vasculature without hyperdense vessel or abnormal calcification. Skull: Innumerable skull lesions consistent with patient's history of myeloma, unchanged. No skull fracture. Sinuses/Orbits: No acute finding. Other: None. CT CERVICAL SPINE FINDINGS Alignment: Trace anterolisthesis of C2 on C3, unchanged. No traumatic subluxation. Skull base and vertebrae: No acute fracture. Multiple small lucencies consistent with multiple myeloma, unchanged from prior exam. No evidence of pathologic fracture. Skull base is intact. Soft tissues and spinal canal: No prevertebral  fluid or swelling. No visible canal hematoma. Disc levels: Multilevel degenerative disc disease with disc space narrowing and endplate spurring, most prominent at C3-C4 and C6-C7. Multilevel facet arthropathy. Upper chest: Right internal jugular dialysis catheter. No acute findings. Other: Myelomatous lesions noted in the right clavicle, bilateral scapula,, sternum and multiple ribs. IMPRESSION: 1. No acute intracranial abnormality. No skull fracture. Stable atrophy and chronic small vessel ischemia. 2. Multilevel degenerative change in the cervical spine without acute fracture or subluxation. 3. Multiple osseous lesions in the calvarium and cervical spine consistent with known multiple myeloma. Electronically Signed   By: Keith Rake M.D.   On: 10/04/2018  02:32    Scheduled Meds: . amLODipine  10 mg Oral QPM  . calcium acetate  1,334 mg Oral TID WC  . Chlorhexidine Gluconate Cloth  6 each Topical Q0600  . [START ON 10/07/2018] cloNIDine  0.2 mg Transdermal Weekly  . [START ON 10/07/2018] doxercalciferol  2 mcg Intravenous Q T,Th,Sa-HD  . feeding supplement (PRO-STAT SUGAR FREE 64)  30 mL Oral BID  . heparin injection (subcutaneous)  5,000 Units Subcutaneous Q8H  . isosorbide mononitrate  60 mg Oral Daily  . lamoTRIgine  50 mg Oral Daily  . latanoprost  1 drop Both Eyes QHS  . LORazepam  0.5 mg Oral QHS  . losartan  50 mg Oral QPM  . multivitamin  1 tablet Oral Daily  . valACYclovir  500 mg Oral QODAY    Continuous Infusions: .  ceFAZolin (ANCEF) IV       LOS: 1 day     Kayleen Memos, MD [facility] Pager [phone]  If 7PM-7AM, please contact night-coverage [email or web address] Password [email or web address] 10/04/2018, 3:21 PM

## 2018-10-04 NOTE — Procedures (Signed)
0730 - patient arrived to HD unit, sleeping, not arousable to name.  Consent previously obtained this am from daughter. 0745 - while changing HD cath drsg, pt woke up, slightly agitated.  Redirected easily at this time 0750 - trmt initiated per policy and without difficulty.  Bed placed in lowest position, close to floor. 0845 - lines reversed due fluctuating art pressures. 0900 - patient awake and agitated.  Crying out, "let me down, let me down."  [name] writhing in bed, unconsolable at this time. 0920 - patient lifted left leg over siderail and placed left foot on floor.  Became less agitated and quieter at this time. 1015 - patient began crying out again,"stop it, let me down, put me down now."  Reassured patient she was receiving HD trmt.  She stated "No no no."  I asked pt was she referring to HD trmt and she said yes.  Re-asked question, do you want to stop this dialysis trmt.  [name] replied "yes" very calmly. Continued with periods of agitation during remainder of trmt.  1100 - treatment information relayed to Dr. Joelyn Oms.

## 2018-10-04 NOTE — Progress Notes (Signed)
Patient back from dialysis. Alert and able to say her name. No acute distress noted, no complaints. VS stable. Unable to take anything by mouth at this time. Will continue to monitor.

## 2018-10-04 NOTE — Progress Notes (Signed)
Patient more alert this night, able to have her dinner and her blood pressure medicine. No acute distress noted at this time. Will continue to monitor.

## 2018-10-04 NOTE — Progress Notes (Signed)
Patient was transported to Head CT with transport and charge nurse.  Will continue to monitor.

## 2018-10-04 NOTE — Progress Notes (Signed)
PHARMACY - PHYSICIAN COMMUNICATION CRITICAL VALUE ALERT - BLOOD CULTURE IDENTIFICATION (BCID)  [name] is an 78 y.o. female who presented to [facility] on 10/03/2018 with a chief complaint of confusion.   Assessment:  Found to have MSSA bacteremia. Now growing 1/2 GNR (not identified on BCID).   Name of physician (or Provider) Contacted: Dr. Johnnye Sima and Dr. Nevada Crane  Current antibiotics: IV cefazolin   Changes to prescribed antibiotics recommended: Broaden to IV Cefepime until sensitivities result Recommendations accepted by provider  Results for orders placed or performed during the hospital encounter of 10/03/18  Blood Culture ID Panel (Reflexed) (Collected: 10/03/2018  7:31 AM)  Result Value Ref Range   Enterococcus species NOT DETECTED NOT DETECTED   Listeria monocytogenes NOT DETECTED NOT DETECTED   Staphylococcus species NOT DETECTED NOT DETECTED   Staphylococcus aureus (BCID) NOT DETECTED NOT DETECTED   Streptococcus species NOT DETECTED NOT DETECTED   Streptococcus agalactiae NOT DETECTED NOT DETECTED   Streptococcus pneumoniae NOT DETECTED NOT DETECTED   Streptococcus pyogenes NOT DETECTED NOT DETECTED   Acinetobacter baumannii NOT DETECTED NOT DETECTED   Enterobacteriaceae species NOT DETECTED NOT DETECTED   Enterobacter cloacae complex NOT DETECTED NOT DETECTED   Escherichia coli NOT DETECTED NOT DETECTED   Klebsiella oxytoca NOT DETECTED NOT DETECTED   Klebsiella pneumoniae NOT DETECTED NOT DETECTED   Proteus species NOT DETECTED NOT DETECTED   Serratia marcescens NOT DETECTED NOT DETECTED   Haemophilus influenzae NOT DETECTED NOT DETECTED   Neisseria meningitidis NOT DETECTED NOT DETECTED   Pseudomonas aeruginosa NOT DETECTED NOT DETECTED   Candida albicans NOT DETECTED NOT DETECTED   Candida glabrata NOT DETECTED NOT DETECTED   Candida krusei NOT DETECTED NOT DETECTED   Candida parapsilosis NOT DETECTED NOT DETECTED   Candida tropicalis NOT DETECTED NOT DETECTED     Albertina Parr, PharmD., BCPS Clinical Pharmacist Clinical phone for 10/04/18 until 8:30pm: [phone] If after 8:30pm, please refer to [facility] for unit-specific pharmacist

## 2018-10-04 NOTE — Progress Notes (Signed)
Pt more alert per dayshift RN. Pt A&Ox3. Pt states she will not get out of bed if restraints d/c. D/c soft waist belt. Pt is on low bed. Will continue to monitor pt. Ranelle Oyster, RN

## 2018-10-04 NOTE — Procedures (Signed)
I was present at this dialysis session. I have reviewed the session itself and made appropriate changes.   Cont to have agiatation/AMS.  BCx with Staph aureus.   Filed Weights   10/03/18 1621  Weight: 53.9 kg    Recent Labs  Lab 10/04/18 0519  NA 131*  K 4.3  CL 97*  CO2 22  GLUCOSE 76  BUN 39*  CREATININE 8.38*  CALCIUM 8.8*    Recent Labs  Lab 10/03/18 0325 10/04/18 0519  WBC 4.3 4.7  NEUTROABS 3.5 3.8  HGB 10.8* 8.0*  HCT 36.9 26.9*  MCV 103.4* 106.3*  PLT 149* 193    Scheduled Meds: . amLODipine  10 mg Oral QPM  . calcium acetate  1,334 mg Oral TID WC  . Chlorhexidine Gluconate Cloth  6 each Topical Q0600  . [START ON 10/07/2018] cloNIDine  0.2 mg Transdermal Weekly  . feeding supplement (PRO-STAT SUGAR FREE 64)  30 mL Oral BID  . heparin injection (subcutaneous)  5,000 Units Subcutaneous Q8H  . isosorbide mononitrate  60 mg Oral Daily  . lamoTRIgine  50 mg Oral Daily  . latanoprost  1 drop Both Eyes QHS  . LORazepam  0.5 mg Oral QHS  . losartan  50 mg Oral QPM  . multivitamin  1 tablet Oral Daily  . valACYclovir  500 mg Oral QODAY   Continuous Infusions: . sodium chloride    . sodium chloride    . piperacillin-tazobactam (ZOSYN)  IV 3.375 g (10/03/18 2354)   PRN Meds:.sodium chloride, sodium chloride, acetaminophen, alteplase, cloNIDine, heparin, hydrALAZINE, lactulose   Pearson Grippe  MD 10/04/2018, 8:29 AM

## 2018-10-04 NOTE — Progress Notes (Signed)
Patient in dialysis at this time.

## 2018-10-05 LAB — BASIC METABOLIC PANEL
Anion gap: 6 (ref 5–15)
BUN: 19 mg/dL (ref 8–23)
CO2: 28 mmol/L (ref 22–32)
Calcium: 8.4 mg/dL — ABNORMAL LOW (ref 8.9–10.3)
Chloride: 97 mmol/L — ABNORMAL LOW (ref 98–111)
Creatinine, Ser: 5.01 mg/dL — ABNORMAL HIGH (ref 0.44–1.00)
GFR calc Af Amer: 9 mL/min — ABNORMAL LOW (ref >60)
GFR, EST NON AFRICAN AMERICAN: 8 mL/min — AB (ref >60)
Glucose, Bld: 71 mg/dL (ref 70–99)
POTASSIUM: 3.7 mmol/L (ref 3.5–5.1)
Sodium: 131 mmol/L — ABNORMAL LOW (ref 135–145)

## 2018-10-05 LAB — IRON AND TIBC: Iron: 60 ug/dL (ref 28–170)

## 2018-10-05 LAB — CBC WITH DIFFERENTIAL/PLATELET
Abs Immature Granulocytes: 0.03 10*3/uL (ref 0.00–0.07)
BASOS ABS: 0 10*3/uL (ref 0.0–0.1)
Basophils Relative: 1 %
Eosinophils Absolute: 0.1 10*3/uL (ref 0.0–0.5)
Eosinophils Relative: 1 %
HCT: 25.3 % — ABNORMAL LOW (ref 36.0–46.0)
Hemoglobin: 7.5 g/dL — ABNORMAL LOW (ref 12.0–15.0)
Immature Granulocytes: 1 %
Lymphocytes Relative: 17 %
Lymphs Abs: 0.6 10*3/uL — ABNORMAL LOW (ref 0.7–4.0)
MCH: 31 pg (ref 26.0–34.0)
MCHC: 29.6 g/dL — ABNORMAL LOW (ref 30.0–36.0)
MCV: 104.5 fL — ABNORMAL HIGH (ref 80.0–100.0)
Monocytes Absolute: 0.4 10*3/uL (ref 0.1–1.0)
Monocytes Relative: 10 %
Neutro Abs: 2.7 10*3/uL (ref 1.7–7.7)
Neutrophils Relative %: 70 %
PLATELETS: 164 10*3/uL (ref 150–400)
RBC: 2.42 MIL/uL — ABNORMAL LOW (ref 3.87–5.11)
RDW: 19.1 % — ABNORMAL HIGH (ref 11.5–15.5)
WBC: 3.9 10*3/uL — ABNORMAL LOW (ref 4.0–10.5)
nRBC: 0 % (ref 0.0–0.2)

## 2018-10-05 LAB — SAVE SMEAR(SSMR), FOR PROVIDER SLIDE REVIEW

## 2018-10-05 LAB — RETICULOCYTES
Immature Retic Fract: 10.2 % (ref 2.3–15.9)
RBC.: 2.23 MIL/uL — ABNORMAL LOW (ref 3.87–5.11)
Retic Count, Absolute: 44.2 10*3/uL (ref 19.0–186.0)
Retic Ct Pct: 2 % (ref 0.4–3.1)

## 2018-10-05 LAB — VITAMIN B12: Vitamin B-12: 600 pg/mL (ref 180–914)

## 2018-10-05 LAB — FERRITIN: FERRITIN: 1623 ng/mL — AB (ref 11–307)

## 2018-10-05 NOTE — Progress Notes (Signed)
PROGRESS NOTE  [name] HWT:[id number] DOB: 05/30/1941 DOA: 10/03/2018 PCP: Hendricks Limes, MD  HPI/Recap of past 24 hours: [name] is a 78 y.o. female with medical history significant for end-stage renal disease on hemodialysis Monday Wednesday Friday, multiple myeloma, dementia, hypertension, chronic diastolic CHF, chronic hyponatremia, ambulatory dysfunction wheelchair-bound who presented to [facility] ED from SNF due to sudden change in behavior last night.  Patient is unable to provide a history due to altered mental status.  History is obtained from her nursing facility and from medical records.  Per RN at SNF around 11 PM last night patient became suddenly confused, pulling out her IVs being uncooperative and aggressive.  No recent changes in her medications.  Blood pressure was excessively elevated.  Patient was brought to the ED for further evaluation.  Per RN patient has had intermittent change in behavior in the last few months but just not as severe.   Patient has been refusing hemodialysis.  Last hemodialysis was last Wednesday.  Recently discharged on IV Zosyn for Achromobacter bacteremia.  [facility] asked to admit.  10/04/2018: She has been pulling at her IV and attempted to get out of bed.  She had a fall overnight.  CT head and neck revealed no acute intracranial findings, fracture or subluxation.  10/05/2018: Patient seen and examined at her bedside.  No acute events overnight.  Patient is more alert today and oriented x3.  She has no new complaints.  Being treated for MSSA and Streptococcus agalactiae bacteremia.  Assessment/Plan: Active Problems:   Acute metabolic encephalopathy  Improving acute metabolic encephalopathy possibly secondary to MSSA/Streptococcus agalactiae bacteremia Presented with altered mental status pulling at IV and being aggressive Improving overnight with treatment for her bacteremia Ancef switched to cefepime until sensitivities return. Repeated blood  cultures x2 peripherally done on 1 4 are negative to date Fall overnight CT head and neck unremarkable for any acute findings Fall precautions Reorient as needed  MSSA/Streptococcus agalactiae bacteremia Blood cultures drawn on admission revealed positive MSSA 1 out of 2 bottles Infectious disease following Repeat blood cultures x2 peripherally negative to date Switch Ancef to cefepime until sensitivities return ID recommends to hold off endocarditis work-up given her comorbidities Defer HD cath removal to nephrology Monitor fever curve and WBC  Anemia of chronic disease Drop in hemoglobin from 8.0 to 7.5 No sign of overt bleeding Transfuse if drops below 7.0 MCV 104 Obtain B12 and folate levels; also iron studies  Dysphagia Speech therapy recommended dysphagia 1 with thin liquids Aspiration precautions  Chronic hyponatremia Sodium 131 Electrolytes and volume status managed by nephrology  Hypertensive emergency Presented with diastolic blood pressures in the 150's and altered mental status Resume home antihypertensive medications IV hydralazine 10 mg every 6 hours as needed for systolic blood pressure greater than 917 or diastolic greater than 915  End-stage renal disease on hemodialysis Monday Wednesday Friday Patient has refused hemodialysis multiple times recently Last hemodialysis was last Wednesday Nephrology consulted for hemodialysis Monitor electrolytes  Multiple myeloma Patient undergoes radiation once a week Follows with oncology outpatient  Chronic hyponatremia, appears euvolemic Sodium 131 Electrolytes and volume status managed by nephrology Repeat BMP in the morning  Recent history of bacteremia Blood cultures drawn on 08/25/2018 showed multi- drug-resistant Acromobacter On IV Zosyn per her RN at SNF  Chronic diastolic CHF Last 2D echo done on 08/27/2018 revealed preserved LVEF and grade 1 diastolic dysfunction Resume cardiac  medications  Moderate pulmonary hypertension Pulmonary arterial pressure 44 mmHg No evidence  of vegetation Follows with cardiology outpatient  Physical debility/ambulatory dysfunction PT to assess Fall precautions  Risks: Patient is high risk for the compensation due to acute metabolic encephalopathy suspect secondary to hypertensive emergency, missing sessions of hemodialysis, multiple comorbidities and advanced age.  Patient will require at least 2 midnights for further evaluation and treatment of present condition.  DVT prophylaxis: Subcu heparin 5000 units 3 times daily  Code Status: Full code until otherwise noted by family  Family Communication: Called daughter multiple times to update and left messages.  Disposition Plan:  Possibly back to SNF when hemodynamically stable  Consults called: Nephrology      Objective: Vitals:   10/04/18 1520 10/04/18 1727 10/04/18 2047 10/05/18 0438  BP: (!) 157/97 (!) 155/89 (!) 157/102 (!) 146/90  Pulse: 65  65 (!) 58  Resp: '18  18 20  ' Temp:      TempSrc:      SpO2: 95%  100% 100%  Weight:        Intake/Output Summary (Last 24 hours) at 10/05/2018 1247 Last data filed at 10/04/2018 1944 Gross per 24 hour  Intake 330 ml  Output -  Net 330 ml   Filed Weights   10/03/18 1621 10/04/18 0745 10/04/18 1115  Weight: 53.9 kg 57 kg 54.9 kg    Exam:  . General: 78 y.o. year-old female well-appearing in no acute distress.  Alert and oriented x3.   . Cardiovascular: Regular rate and rhythm with no rubs or gallops.  No JVD or thyromegaly noted.   Marland Kitchen Respiratory: Clear to auscultation with no wheezes or rales.  Good inspiratory effort. . Abdomen: Soft nontender nondistended with normal bowel sounds x4 quadrants. . Musculoskeletal: No lower extremity edema. 2/4 pulses in all 4 extremities. Marland Kitchen Psychiatry: Unable to assess mood due to altered mental status.   Data Reviewed: CBC: Recent Labs  Lab 10/03/18 0325 10/04/18 0519  10/05/18 0300  WBC 4.3 4.7 3.9*  NEUTROABS 3.5 3.8 2.7  HGB 10.8* 8.0* 7.5*  HCT 36.9 26.9* 25.3*  MCV 103.4* 106.3* 104.5*  PLT 149* 193 646   Basic Metabolic Panel: Recent Labs  Lab 10/03/18 0325 10/04/18 0519 10/05/18 0300  NA 131* 131* 131*  K 4.4 4.3 3.7  CL 95* 97* 97*  CO2 '29 22 28  ' GLUCOSE 85 76 71  BUN 30* 39* 19  CREATININE 7.27* 8.38* 5.01*  CALCIUM 9.4 8.8* 8.4*   GFR: Estimated Creatinine Clearance: 7.9 mL/min (A) (by C-G formula based on SCr of 5.01 mg/dL (H)). Liver Function Tests: Recent Labs  Lab 10/03/18 0325  AST 18  ALT 8  ALKPHOS 49  BILITOT 0.2*  PROT 9.3*  ALBUMIN 2.2*   No results for input(s): LIPASE, AMYLASE in the last 168 hours. Recent Labs  Lab 10/03/18 0326  AMMONIA 19   Coagulation Profile: No results for input(s): INR, PROTIME in the last 168 hours. Cardiac Enzymes: No results for input(s): CKTOTAL, CKMB, CKMBINDEX, TROPONINI in the last 168 hours. BNP (last 3 results) No results for input(s): PROBNP in the last 8760 hours. HbA1C: No results for input(s): HGBA1C in the last 72 hours. CBG: Recent Labs  Lab 10/03/18 0347  GLUCAP 83   Lipid Profile: No results for input(s): CHOL, HDL, LDLCALC, TRIG, CHOLHDL, LDLDIRECT in the last 72 hours. Thyroid Function Tests: No results for input(s): TSH, T4TOTAL, FREET4, T3FREE, THYROIDAB in the last 72 hours. Anemia Panel: No results for input(s): VITAMINB12, FOLATE, FERRITIN, TIBC, IRON, RETICCTPCT in the last 72 hours. Urine  analysis:    Component Value Date/Time   COLORURINE STRAW (A) 09/16/2018 0326   APPEARANCEUR CLEAR 09/16/2018 0326   LABSPEC 1.010 09/16/2018 0326   PHURINE 9.0 (H) 09/16/2018 0326   GLUCOSEU 50 (A) 09/16/2018 0326   HGBUR NEGATIVE 09/16/2018 0326   BILIRUBINUR NEGATIVE 09/16/2018 0326   KETONESUR 5 (A) 09/16/2018 0326   PROTEINUR 100 (A) 09/16/2018 0326   UROBILINOGEN 0.2 04/10/2015 1800   NITRITE NEGATIVE 09/16/2018 0326   LEUKOCYTESUR NEGATIVE  09/16/2018 0326   Sepsis Labs: '@LABRCNTIP' (procalcitonin:4,lacticidven:4)  ) Recent Results (from the past 240 hour(s))  Blood culture (routine x 2)     Status: None (Preliminary result)   Collection Time: 10/03/18  7:30 AM  Result Value Ref Range Status   Specimen Description BLOOD RIGHT HAND  Final   Special Requests   Final    BOTTLES DRAWN AEROBIC AND ANAEROBIC Blood Culture adequate volume Performed at [facility], [place] [address], [place], [place] [address]    Culture  Setup Time   Final    GRAM NEGATIVE RODS AEROBIC BOTTLE ONLY CRITICAL RESULT CALLED TO, READ BACK BY AND VERIFIED WITH: PHARMD BEN MANCHERIL [phone] FCP    Culture GRAM NEGATIVE RODS  Final   Report Status PENDING  Incomplete  Blood Culture ID Panel (Reflexed)     Status: None   Collection Time: 10/03/18  7:31 AM  Result Value Ref Range Status   Enterococcus species NOT DETECTED NOT DETECTED Final   Listeria monocytogenes NOT DETECTED NOT DETECTED Final   Staphylococcus species NOT DETECTED NOT DETECTED Final   Staphylococcus aureus (BCID) NOT DETECTED NOT DETECTED Final   Streptococcus species NOT DETECTED NOT DETECTED Final   Streptococcus agalactiae NOT DETECTED NOT DETECTED Final   Streptococcus pneumoniae NOT DETECTED NOT DETECTED Final   Streptococcus pyogenes NOT DETECTED NOT DETECTED Final   Acinetobacter baumannii NOT DETECTED NOT DETECTED Final   Enterobacteriaceae species NOT DETECTED NOT DETECTED Final   Enterobacter cloacae complex NOT DETECTED NOT DETECTED Final   Escherichia coli NOT DETECTED NOT DETECTED Final   Klebsiella oxytoca NOT DETECTED NOT DETECTED Final   Klebsiella pneumoniae NOT DETECTED NOT DETECTED Final   Proteus species NOT DETECTED NOT DETECTED Final   Serratia marcescens NOT DETECTED NOT DETECTED Final   Haemophilus influenzae NOT DETECTED NOT DETECTED Final   Neisseria meningitidis NOT DETECTED NOT DETECTED Final   Pseudomonas aeruginosa NOT DETECTED NOT  DETECTED Final   Candida albicans NOT DETECTED NOT DETECTED Final   Candida glabrata NOT DETECTED NOT DETECTED Final   Candida krusei NOT DETECTED NOT DETECTED Final   Candida parapsilosis NOT DETECTED NOT DETECTED Final   Candida tropicalis NOT DETECTED NOT DETECTED Final  Blood culture (routine x 2)     Status: Abnormal (Preliminary result)   Collection Time: 10/03/18  7:33 AM  Result Value Ref Range Status   Specimen Description BLOOD RIGHT ANTECUBITAL  Final   Special Requests   Final    BOTTLES DRAWN AEROBIC AND ANAEROBIC Blood Culture results may not be optimal due to an inadequate volume of blood received in culture bottles   Culture  Setup Time   Final    GRAM POSITIVE COCCI AEROBIC BOTTLE ONLY Organism ID to follow CRITICAL RESULT CALLED TO, READ BACK BY AND VERIFIED WITHKarsten Ro PHARMD [id number] 10/04/18 A BROWNING Performed at [facility], [address], [place], [place] [address]    Culture (A)  Final    STAPHYLOCOCCUS AUREUS SUSCEPTIBILITIES TO  FOLLOW    Report Status PENDING  Incomplete  Blood Culture ID Panel (Reflexed)     Status: Abnormal   Collection Time: 10/03/18  7:33 AM  Result Value Ref Range Status   Enterococcus species NOT DETECTED NOT DETECTED Final   Listeria monocytogenes NOT DETECTED NOT DETECTED Final   Staphylococcus species DETECTED (A) NOT DETECTED Final    Comment: CRITICAL RESULT CALLED TO, READ BACK BY AND VERIFIED WITH: [facility] PHARMD [id number] 10/04/18 A BROWNING    Staphylococcus aureus (BCID) DETECTED (A) NOT DETECTED Final    Comment: Methicillin (oxacillin) susceptible Staphylococcus aureus (MSSA). Preferred therapy is anti staphylococcal beta lactam antibiotic (Cefazolin or Nafcillin), unless clinically contraindicated. CRITICAL RESULT CALLED TO, READ BACK BY AND VERIFIED WITH: Karsten Ro PHARMD 0240 10/04/18 A BROWNING    Methicillin resistance NOT DETECTED NOT DETECTED Final   Streptococcus species NOT DETECTED NOT DETECTED Final    Streptococcus agalactiae NOT DETECTED NOT DETECTED Final   Streptococcus pneumoniae NOT DETECTED NOT DETECTED Final   Streptococcus pyogenes NOT DETECTED NOT DETECTED Final   Acinetobacter baumannii NOT DETECTED NOT DETECTED Final   Enterobacteriaceae species NOT DETECTED NOT DETECTED Final   Enterobacter cloacae complex NOT DETECTED NOT DETECTED Final   Escherichia coli NOT DETECTED NOT DETECTED Final   Klebsiella oxytoca NOT DETECTED NOT DETECTED Final   Klebsiella pneumoniae NOT DETECTED NOT DETECTED Final   Proteus species NOT DETECTED NOT DETECTED Final   Serratia marcescens NOT DETECTED NOT DETECTED Final   Haemophilus influenzae NOT DETECTED NOT DETECTED Final   Neisseria meningitidis NOT DETECTED NOT DETECTED Final   Pseudomonas aeruginosa NOT DETECTED NOT DETECTED Final   Candida albicans NOT DETECTED NOT DETECTED Final   Candida glabrata NOT DETECTED NOT DETECTED Final   Candida krusei NOT DETECTED NOT DETECTED Final   Candida parapsilosis NOT DETECTED NOT DETECTED Final   Candida tropicalis NOT DETECTED NOT DETECTED Final    Comment: Performed at [facility], [place] [address], [place], [place] [address]  MRSA PCR Screening     Status: None   Collection Time: 10/03/18  6:46 PM  Result Value Ref Range Status   MRSA by PCR NEGATIVE NEGATIVE Final    Comment:        The GeneXpert MRSA Assay (FDA approved for NASAL specimens only), is one component of a comprehensive MRSA colonization surveillance program. It is not intended to diagnose MRSA infection nor to guide or monitor treatment for MRSA infections. Performed at [facility], [place] [address] [place] [address], [place], [place] [address]   Culture, blood (Routine X 2) w Reflex to ID Panel     Status: None (Preliminary result)   Collection Time: 10/04/18  1:18 PM  Result Value Ref Range Status   Specimen Description BLOOD BLOOD LEFT HAND  Final   Special Requests AEROBIC BOTTLE ONLY Blood Culture adequate volume  Final    Culture NO GROWTH < 24 HOURS  Final   Report Status PENDING  Incomplete      Studies: No results found.  Scheduled Meds: . amLODipine  10 mg Oral QPM  . calcium acetate  1,334 mg Oral TID WC  . Chlorhexidine Gluconate Cloth  6 each Topical Q0600  . [START ON 10/07/2018] cloNIDine  0.2 mg Transdermal Weekly  . [START ON 10/07/2018] doxercalciferol  2 mcg Intravenous Q T,Th,Sa-HD  . feeding supplement (PRO-STAT SUGAR FREE 64)  30 mL Oral BID  . heparin injection (subcutaneous)  5,000 Units Subcutaneous Q8H  . isosorbide  mononitrate  60 mg Oral Daily  . lamoTRIgine  50 mg Oral Daily  . latanoprost  1 drop Both Eyes QHS  . LORazepam  0.5 mg Oral QHS  . losartan  50 mg Oral QPM  . multivitamin  1 tablet Oral Daily  . valACYclovir  500 mg Oral QODAY    Continuous Infusions: . ceFEPime (MAXIPIME) IV 1 g (10/04/18 1944)     LOS: 2 days     Kayleen Memos, MD [facility] Pager [phone]  If 7PM-7AM, please contact night-coverage [email or web address] Password [facility] 10/05/2018, 12:47 PM

## 2018-10-05 NOTE — Progress Notes (Signed)
INFECTIOUS DISEASE PROGRESS NOTE  ID: [name] is a 78 y.o. female with  Active Problems:   Acute metabolic encephalopathy  Subjective: Resting quietly  Abtx:  Anti-infectives (From admission, onward)   Start     Dose/Rate Route Frequency Ordered Stop   10/04/18 2000  ceFEPIme (MAXIPIME) 1 g in sodium chloride 0.9 % 100 mL IVPB     1 g 200 mL/hr over 30 Minutes Intravenous Every 24 hours 10/04/18 1829     10/04/18 1800  ceFAZolin (ANCEF) IVPB 2g/100 mL premix  Status:  Discontinued     2 g 200 mL/hr over 30 Minutes Intravenous Every T-Th-Sa (1800) 10/04/18 1313 10/04/18 1829   10/03/18 1400  valACYclovir (VALTREX) tablet 500 mg     500 mg Oral Every other day 10/03/18 0852     10/03/18 1330  piperacillin-tazobactam (ZOSYN) IVPB 3.375 g  Status:  Discontinued     3.375 g 12.5 mL/hr over 240 Minutes Intravenous Every 12 hours 10/03/18 1317 10/04/18 1255   10/03/18 1315  piperacillin-tazobactam (ZOSYN) injection 2.25 g  Status:  Discontinued     2.25 g Intramuscular Every 12 hours 10/03/18 1305 10/03/18 1316      Medications:  Scheduled: . amLODipine  10 mg Oral QPM  . calcium acetate  1,334 mg Oral TID WC  . Chlorhexidine Gluconate Cloth  6 each Topical Q0600  . [START ON 10/07/2018] cloNIDine  0.2 mg Transdermal Weekly  . [START ON 10/07/2018] doxercalciferol  2 mcg Intravenous Q T,Th,Sa-HD  . feeding supplement (PRO-STAT SUGAR FREE 64)  30 mL Oral BID  . heparin injection (subcutaneous)  5,000 Units Subcutaneous Q8H  . isosorbide mononitrate  60 mg Oral Daily  . lamoTRIgine  50 mg Oral Daily  . latanoprost  1 drop Both Eyes QHS  . LORazepam  0.5 mg Oral QHS  . losartan  50 mg Oral QPM  . multivitamin  1 tablet Oral Daily  . valACYclovir  500 mg Oral QODAY    Objective: Vital signs in last 24 hours: Pulse Rate:  [58-65] 58 (01/05 0438) Resp:  [18-20] 20 (01/05 0438) BP: (146-157)/(89-102) 146/90 (01/05 0438) SpO2:  [95 %-100 %] 100 % (01/05 0438)   General  appearance: no distress Resp: clear to auscultation bilaterally Cardio: regular rate and rhythm GI: normal findings: bowel sounds normal and soft, non-tender  Lab Results Recent Labs    10/04/18 0519 10/05/18 0300  WBC 4.7 3.9*  HGB 8.0* 7.5*  HCT 26.9* 25.3*  NA 131* 131*  K 4.3 3.7  CL 97* 97*  CO2 22 28  BUN 39* 19  CREATININE 8.38* 5.01*   Liver Panel Recent Labs    10/03/18 0325  PROT 9.3*  ALBUMIN 2.2*  AST 18  ALT 8  ALKPHOS 49  BILITOT 0.2*   Sedimentation Rate No results for input(s): ESRSEDRATE in the last 72 hours. C-Reactive Protein No results for input(s): CRP in the last 72 hours.  Microbiology: Recent Results (from the past 240 hour(s))  Blood culture (routine x 2)     Status: None (Preliminary result)   Collection Time: 10/03/18  7:30 AM  Result Value Ref Range Status   Specimen Description BLOOD RIGHT HAND  Final   Special Requests   Final    BOTTLES DRAWN AEROBIC AND ANAEROBIC Blood Culture adequate volume Performed at [facility], [address], [place], [place] [address]    Culture  Setup Time   Final    GRAM NEGATIVE RODS  AEROBIC BOTTLE ONLY CRITICAL RESULT CALLED TO, READ BACK BY AND VERIFIED WITH: PHARMD BEN MANCHERIL [phone] [id number] FCP    Culture GRAM NEGATIVE RODS  Final   Report Status PENDING  Incomplete  Blood Culture ID Panel (Reflexed)     Status: None   Collection Time: 10/03/18  7:31 AM  Result Value Ref Range Status   Enterococcus species NOT DETECTED NOT DETECTED Final   Listeria monocytogenes NOT DETECTED NOT DETECTED Final   Staphylococcus species NOT DETECTED NOT DETECTED Final   Staphylococcus aureus (BCID) NOT DETECTED NOT DETECTED Final   Streptococcus species NOT DETECTED NOT DETECTED Final   Streptococcus agalactiae NOT DETECTED NOT DETECTED Final   Streptococcus pneumoniae NOT DETECTED NOT DETECTED Final   Streptococcus pyogenes NOT DETECTED NOT DETECTED Final   Acinetobacter baumannii NOT DETECTED NOT  DETECTED Final   Enterobacteriaceae species NOT DETECTED NOT DETECTED Final   Enterobacter cloacae complex NOT DETECTED NOT DETECTED Final   Escherichia coli NOT DETECTED NOT DETECTED Final   Klebsiella oxytoca NOT DETECTED NOT DETECTED Final   Klebsiella pneumoniae NOT DETECTED NOT DETECTED Final   Proteus species NOT DETECTED NOT DETECTED Final   Serratia marcescens NOT DETECTED NOT DETECTED Final   Haemophilus influenzae NOT DETECTED NOT DETECTED Final   Neisseria meningitidis NOT DETECTED NOT DETECTED Final   Pseudomonas aeruginosa NOT DETECTED NOT DETECTED Final   Candida albicans NOT DETECTED NOT DETECTED Final   Candida glabrata NOT DETECTED NOT DETECTED Final   Candida krusei NOT DETECTED NOT DETECTED Final   Candida parapsilosis NOT DETECTED NOT DETECTED Final   Candida tropicalis NOT DETECTED NOT DETECTED Final  Blood culture (routine x 2)     Status: Abnormal (Preliminary result)   Collection Time: 10/03/18  7:33 AM  Result Value Ref Range Status   Specimen Description BLOOD RIGHT ANTECUBITAL  Final   Special Requests   Final    BOTTLES DRAWN AEROBIC AND ANAEROBIC Blood Culture results may not be optimal due to an inadequate volume of blood received in culture bottles   Culture  Setup Time   Final    GRAM POSITIVE COCCI AEROBIC BOTTLE ONLY Organism ID to follow CRITICAL RESULT CALLED TO, READ BACK BY AND VERIFIED WITHKarsten Ro PHARMD [id number] 10/04/18 A BROWNING Performed at [facility], [address], [place], [place] [address]    Culture (A)  Final    STAPHYLOCOCCUS AUREUS SUSCEPTIBILITIES TO FOLLOW    Report Status PENDING  Incomplete  Blood Culture ID Panel (Reflexed)     Status: Abnormal   Collection Time: 10/03/18  7:33 AM  Result Value Ref Range Status   Enterococcus species NOT DETECTED NOT DETECTED Final   Listeria monocytogenes NOT DETECTED NOT DETECTED Final   Staphylococcus species DETECTED (A) NOT DETECTED Final    Comment: CRITICAL RESULT CALLED  TO, READ BACK BY AND VERIFIED WITH: J [facility] PHARMD [id number] 10/04/18 A BROWNING    Staphylococcus aureus (BCID) DETECTED (A) NOT DETECTED Final    Comment: Methicillin (oxacillin) susceptible Staphylococcus aureus (MSSA). Preferred therapy is anti staphylococcal beta lactam antibiotic (Cefazolin or Nafcillin), unless clinically contraindicated. CRITICAL RESULT CALLED TO, READ BACK BY AND VERIFIED WITH: J [place] PHARMD 0240 10/04/18 A BROWNING    Methicillin resistance NOT DETECTED NOT DETECTED Final   Streptococcus species NOT DETECTED NOT DETECTED Final   Streptococcus agalactiae NOT DETECTED NOT DETECTED Final   Streptococcus pneumoniae NOT DETECTED NOT DETECTED Final   Streptococcus pyogenes NOT DETECTED NOT DETECTED Final  Acinetobacter baumannii NOT DETECTED NOT DETECTED Final   Enterobacteriaceae species NOT DETECTED NOT DETECTED Final   Enterobacter cloacae complex NOT DETECTED NOT DETECTED Final   Escherichia coli NOT DETECTED NOT DETECTED Final   Klebsiella oxytoca NOT DETECTED NOT DETECTED Final   Klebsiella pneumoniae NOT DETECTED NOT DETECTED Final   Proteus species NOT DETECTED NOT DETECTED Final   Serratia marcescens NOT DETECTED NOT DETECTED Final   Haemophilus influenzae NOT DETECTED NOT DETECTED Final   Neisseria meningitidis NOT DETECTED NOT DETECTED Final   Pseudomonas aeruginosa NOT DETECTED NOT DETECTED Final   Candida albicans NOT DETECTED NOT DETECTED Final   Candida glabrata NOT DETECTED NOT DETECTED Final   Candida krusei NOT DETECTED NOT DETECTED Final   Candida parapsilosis NOT DETECTED NOT DETECTED Final   Candida tropicalis NOT DETECTED NOT DETECTED Final    Comment: Performed at [facility], [place] [address], [place], [place] [address]  MRSA PCR Screening     Status: None   Collection Time: 10/03/18  6:46 PM  Result Value Ref Range Status   MRSA by PCR NEGATIVE NEGATIVE Final    Comment:        The GeneXpert MRSA Assay (FDA approved for NASAL  specimens only), is one component of a comprehensive MRSA colonization surveillance program. It is not intended to diagnose MRSA infection nor to guide or monitor treatment for MRSA infections. Performed at [facility], [place] [address], [place],  [address]   Culture, blood (Routine X 2) w Reflex to ID Panel     Status: None (Preliminary result)   Collection Time: 10/04/18  1:18 PM  Result Value Ref Range Status   Specimen Description BLOOD BLOOD LEFT HAND  Final   Special Requests AEROBIC BOTTLE ONLY Blood Culture adequate volume  Final   Culture NO GROWTH < 24 HOURS  Final   Report Status PENDING  Incomplete    Studies/Results: Ct Head Wo Contrast  Result Date: 10/04/2018 CLINICAL DATA:  Head trauma, ataxia EXAM: CT HEAD WITHOUT CONTRAST CT CERVICAL SPINE WITHOUT CONTRAST TECHNIQUE: Multidetector CT imaging of the head and cervical spine was performed following the standard protocol without intravenous contrast. Multiplanar CT image reconstructions of the cervical spine were also generated. COMPARISON:  Head CT yesterday at 0455 hour, brain MRI 09/16/2018. Cervical spine CT 07/26/2018 FINDINGS: CT HEAD FINDINGS Brain: No intracranial hemorrhage, mass effect, or midline shift. No hydrocephalus. The basilar cisterns are patent. No evidence of territorial infarct or acute ischemia. No extra-axial or intracranial fluid collection. Stable degree of atrophy and chronic small vessel ischemia. Remote left cerebellar infarct. Vascular: Atherosclerosis of skullbase vasculature without hyperdense vessel or abnormal calcification. Skull: Innumerable skull lesions consistent with patient's history of myeloma, unchanged. No skull fracture. Sinuses/Orbits: No acute finding. Other: None. CT CERVICAL SPINE FINDINGS Alignment: Trace anterolisthesis of C2 on C3, unchanged. No traumatic subluxation. Skull base and vertebrae: No acute fracture. Multiple small lucencies consistent with multiple myeloma,  unchanged from prior exam. No evidence of pathologic fracture. Skull base is intact. Soft tissues and spinal canal: No prevertebral fluid or swelling. No visible canal hematoma. Disc levels: Multilevel degenerative disc disease with disc space narrowing and endplate spurring, most prominent at C3-C4 and C6-C7. Multilevel facet arthropathy. Upper chest: Right internal jugular dialysis catheter. No acute findings. Other: Myelomatous lesions noted in the right clavicle, bilateral scapula,, sternum and multiple ribs. IMPRESSION: 1. No acute intracranial abnormality. No skull fracture. Stable atrophy and chronic small vessel ischemia. 2. Multilevel  degenerative change in the cervical spine without acute fracture or subluxation. 3. Multiple osseous lesions in the calvarium and cervical spine consistent with known multiple myeloma. Electronically Signed   By: Keith Rake M.D.   On: 10/04/2018 02:32   Ct Cervical Spine Wo Contrast  Result Date: 10/04/2018 CLINICAL DATA:  Head trauma, ataxia EXAM: CT HEAD WITHOUT CONTRAST CT CERVICAL SPINE WITHOUT CONTRAST TECHNIQUE: Multidetector CT imaging of the head and cervical spine was performed following the standard protocol without intravenous contrast. Multiplanar CT image reconstructions of the cervical spine were also generated. COMPARISON:  Head CT yesterday at 0455 hour, brain MRI 09/16/2018. Cervical spine CT 07/26/2018 FINDINGS: CT HEAD FINDINGS Brain: No intracranial hemorrhage, mass effect, or midline shift. No hydrocephalus. The basilar cisterns are patent. No evidence of territorial infarct or acute ischemia. No extra-axial or intracranial fluid collection. Stable degree of atrophy and chronic small vessel ischemia. Remote left cerebellar infarct. Vascular: Atherosclerosis of skullbase vasculature without hyperdense vessel or abnormal calcification. Skull: Innumerable skull lesions consistent with patient's history of myeloma, unchanged. No skull fracture.  Sinuses/Orbits: No acute finding. Other: None. CT CERVICAL SPINE FINDINGS Alignment: Trace anterolisthesis of C2 on C3, unchanged. No traumatic subluxation. Skull base and vertebrae: No acute fracture. Multiple small lucencies consistent with multiple myeloma, unchanged from prior exam. No evidence of pathologic fracture. Skull base is intact. Soft tissues and spinal canal: No prevertebral fluid or swelling. No visible canal hematoma. Disc levels: Multilevel degenerative disc disease with disc space narrowing and endplate spurring, most prominent at C3-C4 and C6-C7. Multilevel facet arthropathy. Upper chest: Right internal jugular dialysis catheter. No acute findings. Other: Myelomatous lesions noted in the right clavicle, bilateral scapula,, sternum and multiple ribs. IMPRESSION: 1. No acute intracranial abnormality. No skull fracture. Stable atrophy and chronic small vessel ischemia. 2. Multilevel degenerative change in the cervical spine without acute fracture or subluxation. 3. Multiple osseous lesions in the calvarium and cervical spine consistent with known multiple myeloma. Electronically Signed   By: Keith Rake M.D.   On: 10/04/2018 02:32     Assessment/Plan: Staph bacteremia (MSSA 1/2) GNR in BCx 1/2 Mental status change ESRD Multiple myeloma Recent achromobacter bacteremia (08-2018)  Day 1 anbx (ancef --> cefepime)  Continue cefepime while we await further ID of her GNR in BCx MSSA sensi pending as well.  Repeat BCx sent this AM Would not check TEE given her overall poor condition (8 EDvisits/admissions  last 5 months)         Bobby Rumpf MD, FACP Infectious Diseases (pager) [phone] [email or web address] 10/05/2018, 12:30 PM  LOS: 2 days

## 2018-10-05 NOTE — Evaluation (Signed)
Clinical/Bedside Swallow Evaluation Patient Details  Name: [name] MRN: [id number] Date of Birth: 10-08-1940  Today's Date: 10/05/2018 Time: SLP Start Time (ACUTE ONLY): 7824 SLP Stop Time (ACUTE ONLY): 0954 SLP Time Calculation (min) (ACUTE ONLY): 21 min  Past Medical History:  Past Medical History:  Diagnosis Date  . Anemia    from dx list from [place]  . Anxiety   . Closed fracture of right distal femur ([place]) 09/01/2015  . Depression   . ESRD (end stage renal disease) on dialysis [facility])    "TTS; [place] [facility]" (04/24/2017)  . GERD (gastroesophageal reflux disease) 04/23/2015  . HCAP (healthcare-associated pneumonia) 04/24/2017  . Heart murmur   . History of blood transfusion    "low HgB when I was going to dialysis center"  . Hypertension   . Hypertension associated with end stage renal disease on dialysis 03/11/2014  . Malnutrition of moderate degree 09/02/2015  . Multiple myeloma ([place]) 03/11/2014  . Patient is Jehov[employer]'s Witness    "I'd rather never have any blood transfusions unless absolutely necessary; please check with me 1st". (04/24/2017)  . Pneumonia ~ 2016  . Transfusion history    08/25/2018 hemoglobin 7.1,+ FOB, status post 2 units packed cells   Past Surgical History:  Past Surgical History:  Procedure Laterality Date  . AV FISTULA PLACEMENT Left   . I&D EXTREMITY Left 02/27/2016   Procedure: ARTHROSCOPIC IRRIGATION AND DEBRIDEMENT EXTREMITY;  Surgeon: Renette Butters, MD;  Location: [place];  Service: Orthopedics;  Laterality: Left;  . IR DIALY SHUNT INTRO NEEDLE/INTRACATH INITIAL W/IMG LEFT Left 08/29/2018  . IR FLUORO GUIDE CV LINE RIGHT  08/29/2018  . IR US GUIDE VASC ACCESS RIGHT  08/29/2018  . TEE WITHOUT CARDIOVERSION N/A 02/29/2016   Procedure: TRANSESOPHAGEAL ECHOCARDIOGRAM (TEE);  Surgeon: Thayer Headings, MD;  Location: [place];  Service: Cardiovascular;  Laterality: N/A;   HPI:  HPI: [name] is a 78 y.o. female with medical history  significant for end-stage renal disease on hemodialysis Monday Wednesday Friday, multiple myeloma, dementia, hypertension, chronic diastolic CHF, chronic hyponatremia, ambulatory dysfunction wheelchair-bound who presented to [facility] ED from SNF due to sudden change in behavior last night.  Patient is unable to provide a history due to altered mental status.  History is obtained from her nursing facility and from medical records.  Per RN at SNF around 11 PM last night patient became suddenly confused, pulling out her IVs being uncooperative and aggressive.  No recent changes in her medications.  Blood pressure was excessively elevated.  Patient was brought to the ED for further evaluation.  Per RN patient has had intermittent change in behavior in the last few months but just not as severe.  Patient has been refusing hemodialysis.  Last hemodialysis was last Wednesday.  Recently discharged on IV Zosyn for Achromobacter bacteremia.  She was found to have acute metabolic encephalopathy secondary to hypertensive urgency vs MSSA bacteremia.  She was also found to be hyponatremic.  CT head is negative for acute findings.     Assessment / Plan / Recommendation Clinical Impression  Clinical swallowing evaluation was completed using thin liquids via spoon, cup and straw, pureed material and soft solids.  Of note, the patient is well known to ST service with multiple clinical swallowing evaluations and MBS's completed in late 2019.  Most recent MBS is dated 08/17/2018 with recommendation for a dysphagia 3 diet with thin liquids.  Most recent bedside swallowing evaluation was completed 08/26/2018 with recommendation for a dysphagia 2  diet with thin liquids.  MBS dated 04/18/2018 did show esophageal issues with backflow into the pharynx noted that the patient was sensate too.  Cranial nerve exam was completed and unremarkable.  Lingual, labial, facial and jaw strength and range of motion were adequate.  The patient presented  with an oral and probable esophageal dysphagia.  She struggled to fully masticate soft solids and the material had to be removed from her oral cavity.  She was observed to pocket material in her right cheek.  Given cues she was able to clear this material but it was time consuming.   This was not seen given pureed material.  Swallow trigger appeared to be timely and hyo-laryngeal movement was appreciated to palpation.  Overt s/s of aspiration were not seen.  However, the patient was noted to belch frequently.  She reported that this is common.  Recommend downgrading her diet to purees with thin liquids.  She should alternate food with liquids and remain upright for 30-60 minutes after meals.  ST will follow up for therapeutic diet tolerance and for possible diet advancement.   SLP Visit Diagnosis: Dysphagia, oral phase (R13.11)    Aspiration Risk  Mild aspiration risk    Diet Recommendation   Dysphagia 1 with thin liquids  Medication Administration: Other (Comment)(at patient discretion)    Other  Recommendations Oral Care Recommendations: Oral care BID   Follow up Recommendations Skilled Nursing facility      Frequency and Duration min 2x/week  2 weeks       Prognosis Prognosis for Safe Diet Advancement: Fair      Swallow Study   General Date of Onset: 10/03/18 HPI: HPI: [name] is a 78 y.o. female with medical history significant for end-stage renal disease on hemodialysis Monday Wednesday Friday, multiple myeloma, dementia, hypertension, chronic diastolic CHF, chronic hyponatremia, ambulatory dysfunction wheelchair-bound who presented to [facility] ED from SNF due to sudden change in behavior last night.  Patient is unable to provide a history due to altered mental status.  History is obtained from her nursing facility and from medical records.  Per RN at SNF around 11 PM last night patient became suddenly confused, pulling out her IVs being uncooperative and aggressive.  No recent changes  in her medications.  Blood pressure was excessively elevated.  Patient was brought to the ED for further evaluation.  Per RN patient has had intermittent change in behavior in the last few months but just not as severe.  Patient has been refusing hemodialysis.  Last hemodialysis was last Wednesday.  Recently discharged on IV Zosyn for Achromobacter bacteremia.  She was found to have acute metabolic encephalopathy secondary to hypertensive urgency vs MSSA bacteremia.  She was also found to be hyponatremic.  CT head is negative for acute findings.   Type of Study: Bedside Swallow Evaluation Previous Swallow Assessment: Numerous swallow evaluations and 2 MBS's.  Most recent MBS 08/17/2018 with recommendation for D3/thin.  BSE 08/26/2018 with recommendation for D2/thin Diet Prior to this Study: Regular Temperature Spikes Noted: No Respiratory Status: Room air History of Recent Intubation: No Behavior/Cognition: Alert;Cooperative;Requires cueing Oral Cavity Assessment: Within Functional Limits Oral Care Completed by SLP: No Oral Cavity - Dentition: Dentures, top;Edentulous(edentulous bottom arch) Vision: Functional for self-feeding Self-Feeding Abilities: Needs assist Patient Positioning: Upright in bed Baseline Vocal Quality: Normal Volitional Cough: Strong Volitional Swallow: Able to elicit    Oral/Motor/Sensory Function Overall Oral Motor/Sensory Function: Within functional limits   Ice Chips Ice chips: Not tested  Thin Liquid Thin Liquid: Within functional limits Presentation: Cup;Spoon;Straw    Nectar Thick Nectar Thick Liquid: Not tested   Honey Thick Honey Thick Liquid: Not tested   Puree Puree: Within functional limits Presentation: Spoon   Solid     Solid: Impaired Presentation: Spoon Oral Phase Impairments: Impaired mastication;Poor awareness of bolus Oral Phase Functional Implications: Oral holding;Oral residue;Impaired mastication;Prolonged oral transit     Shelly Flatten, MA, CCC-SLP Acute Rehab SLP [phone]  Lamar Sprinkles 10/05/2018,10:05 AM

## 2018-10-05 NOTE — Progress Notes (Signed)
Admit: 10/03/2018 LOS: 2  89F with AMS, MSSA + GNRbacteremia; FTT, myeloma  Subjective:  . Agitated overnight . Stated she wanted to stop HD yesterday but this AM says she wants to continue . No family present  . 2.2L Net UF  01/04 0701 - 01/05 0700 In: 380 [P.O.:130; IV Piggyback:250] Out: 2218   Filed Weights   10/03/18 1621 10/04/18 0745 10/04/18 1115  Weight: 53.9 kg 57 kg 54.9 kg    Scheduled Meds: . amLODipine  10 mg Oral QPM  . calcium acetate  1,334 mg Oral TID WC  . Chlorhexidine Gluconate Cloth  6 each Topical Q0600  . [START ON 10/07/2018] cloNIDine  0.2 mg Transdermal Weekly  . [START ON 10/07/2018] doxercalciferol  2 mcg Intravenous Q T,Th,Sa-HD  . feeding supplement (PRO-STAT SUGAR FREE 64)  30 mL Oral BID  . heparin injection (subcutaneous)  5,000 Units Subcutaneous Q8H  . isosorbide mononitrate  60 mg Oral Daily  . lamoTRIgine  50 mg Oral Daily  . latanoprost  1 drop Both Eyes QHS  . LORazepam  0.5 mg Oral QHS  . losartan  50 mg Oral QPM  . multivitamin  1 tablet Oral Daily  . valACYclovir  500 mg Oral QODAY   Continuous Infusions: . ceFEPime (MAXIPIME) IV 1 g (10/04/18 1944)   PRN Meds:.acetaminophen, cloNIDine, hydrALAZINE, lactulose  Current Labs: reviewed    Physical Exam:  Blood pressure (!) 146/90, pulse (!) 58, temperature 97.8 F (36.6 C), temperature source Axillary, resp. rate 20, weight 54.9 kg, SpO2 100 %. General:  Ill appearing elderly female NAD  Head: NCAT sclera not icteric MMM Neck: Supple. No JVD Lungs: CTA bilaterally without wheezes, rales, or rhonchi.  Heart: RRR 2/6 SEM  Abdomen: soft NT + BS Lower extremities: No LE edema or wounds  Neuro:  Not oriented. Does not follow commands.  Dialysis Access: R chest TDC Dsg clean, intact   Dialysis Orders:  AF TTS 3.5h 425/1.5x EDW 54kg 2K/2.25Ca  TDC No heparin bolus Hectorol 2 mcg IV TIW Retacrit 20000 U IV TIW   A 1. Acute encephalopathy -? HTN enceph. Recurrent admits with  similar presentation. Head CT with no acute findings. Further w/u per primary.  2. MSSA and GNR Bacteremia; cefepime, RCID following 3. ESRD -  TTS HD. On schedule.   4. Hypertension/volume  - Very hypertensive on admit. Home meds amlodipine, losartan, clonidine. Likely refusing OP meds. Improved since admission.  Got below EDW last treatment Likely losing body weight. Follow weights here    5. Anemia  - Hb 7.5 this AM, 10.8 at admit and 8.0 yesterday.  ? 1/3 was spuriously inc? Monitor, transfuse if < 7  6. Metabolic bone disease -  Continue VDRA. Ca acetate binder.  7. Nutrition - Renal diet/ prostat for low albumin  8. Multiple myeloma. Followed by [facility] oncology  9. FTT< has seen palliative care; poor prognosis  Pearson Grippe MD 10/05/2018, 2:05 PM  Recent Labs  Lab 10/03/18 0325 10/04/18 0519 10/05/18 0300  NA 131* 131* 131*  K 4.4 4.3 3.7  CL 95* 97* 97*  CO2 '29 22 28  ' GLUCOSE 85 76 71  BUN 30* 39* 19  CREATININE 7.27* 8.38* 5.01*  CALCIUM 9.4 8.8* 8.4*   Recent Labs  Lab 10/03/18 0325 10/04/18 0519 10/05/18 0300  WBC 4.3 4.7 3.9*  NEUTROABS 3.5 3.8 2.7  HGB 10.8* 8.0* 7.5*  HCT 36.9 26.9* 25.3*  MCV 103.4* 106.3* 104.5*  PLT 149* 193 164

## 2018-10-06 DIAGNOSIS — D531 Other megaloblastic anemias, not elsewhere classified: Secondary | ICD-10-CM

## 2018-10-06 DIAGNOSIS — Z1624 Resistance to multiple antibiotics: Secondary | ICD-10-CM

## 2018-10-06 DIAGNOSIS — Z888 Allergy status to other drugs, medicaments and biological substances status: Secondary | ICD-10-CM

## 2018-10-06 DIAGNOSIS — R7881 Bacteremia: Secondary | ICD-10-CM

## 2018-10-06 DIAGNOSIS — B9689 Other specified bacterial agents as the cause of diseases classified elsewhere: Secondary | ICD-10-CM

## 2018-10-06 DIAGNOSIS — F419 Anxiety disorder, unspecified: Secondary | ICD-10-CM

## 2018-10-06 DIAGNOSIS — G9341 Metabolic encephalopathy: Secondary | ICD-10-CM

## 2018-10-06 LAB — CULTURE, BLOOD (ROUTINE X 2): SPECIAL REQUESTS: ADEQUATE

## 2018-10-06 LAB — CBC WITH DIFFERENTIAL/PLATELET
Abs Immature Granulocytes: 0.05 10*3/uL (ref 0.00–0.07)
Basophils Absolute: 0 10*3/uL (ref 0.0–0.1)
Basophils Relative: 0 %
EOS ABS: 0.1 10*3/uL (ref 0.0–0.5)
Eosinophils Relative: 2 %
HCT: 23.6 % — ABNORMAL LOW (ref 36.0–46.0)
Hemoglobin: 7 g/dL — ABNORMAL LOW (ref 12.0–15.0)
Immature Granulocytes: 1 %
Lymphocytes Relative: 21 %
Lymphs Abs: 0.8 10*3/uL (ref 0.7–4.0)
MCH: 31.4 pg (ref 26.0–34.0)
MCHC: 29.7 g/dL — ABNORMAL LOW (ref 30.0–36.0)
MCV: 105.8 fL — ABNORMAL HIGH (ref 80.0–100.0)
MONO ABS: 0.4 10*3/uL (ref 0.1–1.0)
Monocytes Relative: 9 %
NEUTROS ABS: 2.6 10*3/uL (ref 1.7–7.7)
Neutrophils Relative %: 67 %
Platelets: 179 10*3/uL (ref 150–400)
RBC: 2.23 MIL/uL — ABNORMAL LOW (ref 3.87–5.11)
RDW: 18.7 % — ABNORMAL HIGH (ref 11.5–15.5)
WBC: 4 10*3/uL (ref 4.0–10.5)
nRBC: 0 % (ref 0.0–0.2)

## 2018-10-06 LAB — BASIC METABOLIC PANEL
Anion gap: 8 (ref 5–15)
BUN: 35 mg/dL — ABNORMAL HIGH (ref 8–23)
CO2: 25 mmol/L (ref 22–32)
CREATININE: 5.95 mg/dL — AB (ref 0.44–1.00)
Calcium: 8.7 mg/dL — ABNORMAL LOW (ref 8.9–10.3)
Chloride: 96 mmol/L — ABNORMAL LOW (ref 98–111)
GFR calc Af Amer: 7 mL/min — ABNORMAL LOW (ref >60)
GFR calc non Af Amer: 6 mL/min — ABNORMAL LOW (ref >60)
Glucose, Bld: 81 mg/dL (ref 70–99)
Potassium: 3.8 mmol/L (ref 3.5–5.1)
Sodium: 129 mmol/L — ABNORMAL LOW (ref 135–145)

## 2018-10-06 LAB — FOLATE RBC
Folate, Hemolysate: 502 ng/mL
Folate, RBC: 2292 ng/mL (ref >498)
Hematocrit: 21.9 % — ABNORMAL LOW (ref 34.0–46.6)

## 2018-10-06 LAB — TYPE AND SCREEN
ABO/RH(D): O POS
Antibody Screen: NEGATIVE

## 2018-10-06 MED ORDER — SODIUM CHLORIDE 0.9 % IV SOLN
2.0000 g | Freq: Once | INTRAVENOUS | Status: AC
  Administered 2018-10-06: 2 g via INTRAVENOUS
  Filled 2018-10-06: qty 2

## 2018-10-06 MED ORDER — CEFAZOLIN SODIUM-DEXTROSE 2-4 GM/100ML-% IV SOLN
2.0000 g | INTRAVENOUS | Status: DC
  Administered 2018-10-07: 2 g via INTRAVENOUS
  Filled 2018-10-06 (×3): qty 100

## 2018-10-06 MED ORDER — SODIUM CHLORIDE 0.9% IV SOLUTION: Freq: Once | INTRAVENOUS | Status: DC

## 2018-10-06 MED ORDER — SODIUM CHLORIDE 0.9 % IV SOLN
2.0000 g | INTRAVENOUS | Status: DC
  Administered 2018-10-07: 2 g via INTRAVENOUS
  Filled 2018-10-06 (×2): qty 2

## 2018-10-06 MED ORDER — CHLORHEXIDINE GLUCONATE CLOTH 2 % EX PADS
6.0000 | MEDICATED_PAD | Freq: Every day | CUTANEOUS | Status: DC
  Administered 2018-10-07 – 2018-10-09 (×3): 6 via TOPICAL

## 2018-10-06 NOTE — NC FL2 (Signed)
[place] MEDICAID FL2 LEVEL OF CARE SCREENING TOOL     IDENTIFICATION  Patient Name: [name] Birthdate: 1941/01/04 Sex: female Admission Date (Current Location): 10/03/2018  [facility] and [place] Number:  Herbalist and Address:  [facility] [place], [place] [address] [place], [place] [address]      Provider Number: [id number]  Attending Physician Name and Address:  Kayleen Memos, DO  Relative Name and Phone Number:  [name], daughter, [phone]    Current Level of Care: Hospital Recommended Level of Care: [place] Prior Approval Number:    Date Approved/Denied:   PASRR Number:    Discharge Plan: SNF    Current Diagnoses: Patient Active Problem List   Diagnosis Date Noted  . Bacteremia 10/06/2018  . Acute metabolic encephalopathy 54/65/6812  . DNR (do not resuscitate)   . Acute encephalopathy 09/16/2018  . NSTEMI (non-ST elevated myocardial infarction) ([place]) 09/16/2018  . Prolonged QT interval 09/16/2018  . DNR (do not resuscitate) discussion   . Palliative care by specialist   . End-stage renal disease on hemodialysis ([place])   . Hemodialysis catheter infection ([place])   . Anemia associated with chronic renal failure 08/26/2018  . Anemia 08/26/2018  . Hypothermia 08/26/2018  . Weakness generalized 08/25/2018  . Gram-negative bacteremia   . Acute lower UTI 08/12/2018  . Swelling of joint of left wrist 08/12/2018  . Rectal bleeding 08/12/2018  . Neurocognitive deficits 06/05/2018  . Altered mental status 05/28/2018  . Macrocytosis 05/01/2018  . Hypertension, uncontrolled 04/16/2018  . ESRD needing dialysis ([place]) 04/16/2018  . Encephalopathy acute 04/16/2018  . Anxiety and depression 04/16/2018  . Hypertension, accelerated 04/16/2018  . Pressure ulcer, stage I 04/16/2018  . Abnormal chest x-ray 12/26/2017  . HCAP (healthcare-associated pneumonia) 04/24/2017  . Sepsis ([place]) 04/24/2017  . Nausea and vomiting  04/24/2017  . Thrombocytopenia ([place]) 04/24/2017  . Hyperkalemia 01/16/2017  . IBS (irritable bowel syndrome) 12/27/2016  . Mood disorder ([place]) 07/19/2016  . Diastolic dysfunction 75/17/0017  . Insomnia 04/27/2016  . Hyponatremia 03/12/2016  . Diarrhea 03/12/2016  . Hypertensive urgency 02/18/2016  . Swelling of joint of left knee 02/18/2016  . Leukopenia due to antineoplastic chemotherapy ([place]) 09/03/2015  . Malnutrition of moderate degree 09/02/2015  . GERD (gastroesophageal reflux disease) 04/23/2015  . 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 Multiple myeloma ([place]) 03/11/2014  . Hypertension associatd with end stage renal disease on dialysis 03/11/2014    Orientation RESPIRATION BLADDER Height & Weight     Self  Normal Continent Weight: 54.9 kg Height:     BEHAVIORAL SYMPTOMS/MOOD NEUROLOGICAL BOWEL NUTRITION STATUS      Incontinent Diet(Please see DC Summary)  AMBULATORY STATUS COMMUNICATION OF NEEDS Skin   Extensive Assist Verbally PU Stage and Appropriate Care(Stage I on buttocks)                       Personal Care Assistance Level of Assistance  Bathing, Feeding, Dressing Bathing Assistance: Maximum assistance Feeding assistance: Maximum assistance Dressing Assistance: Maximum assistance     Functional Limitations Info  Sight, Hearing, Speech Sight Info: Adequate Hearing Info: Adequate Speech Info: Adequate    SPECIAL CARE FACTORS FREQUENCY                       Contractures Contractures Info: Not present    Additional Factors Info  Code Status, Allergies, Psychotropic Code Status Info: Full Allergies Info: Phenergan Psychotropic Info: Ativan  Current Medications (10/06/2018):  This is the current hospital active medication list Current Facility-Administered Medications  Medication Dose Route Frequency Provider Last Rate Last Dose  . acetaminophen (TYLENOL) tablet 650 mg  650 mg Oral BID PRN Irene Pap N, DO      . amLODipine  (NORVASC) tablet 10 mg  10 mg Oral QPM Hall, Carole N, DO   10 mg at 10/05/18 1754  . calcium acetate (PHOSLO) capsule 1,334 mg  1,334 mg Oral TID WC Hall, Carole N, DO   1,334 mg at 10/06/18 0830  . [START ON 10/07/2018] ceFAZolin (ANCEF) IVPB 2g/100 mL premix  2 g Intravenous Q T,Th,Sa-HD [place], FNP      . cefTAZidime (FORTAZ) 2 g in sodium chloride 0.9 % 100 mL IVPB  2 g Intravenous Once [place], FNP       Followed by  . [START ON 10/07/2018] cefTAZidime (FORTAZ) 2 g in sodium chloride 0.9 % 100 mL IVPB  2 g Intravenous Q T,Th,Sa-HD [place], FNP      . Chlorhexidine Gluconate Cloth 2 % PADS 6 each  6 each Topical Q0600 Lynnda Child, PA-C   6 each at 10/06/18 0406  . [START ON 10/07/2018] cloNIDine (CATAPRES - Dosed in mg/24 hr) patch 0.2 mg  0.2 mg Transdermal Weekly Hall, Carole N, DO      . cloNIDine (CATAPRES) tablet 0.1 mg  0.1 mg Oral Q8H PRN Kayleen Memos, DO      . [START ON 10/07/2018] doxercalciferol (HECTOROL) injection 2 mcg  2 mcg Intravenous Q T,Th,Sa-HD Ejigiri, Thomos Lemons, PA-C      . feeding supplement (PRO-STAT SUGAR FREE 64) liquid 30 mL  30 mL Oral BID Lynnda Child, PA-C   30 mL at 10/05/18 2142  . heparin injection 5,000 Units  5,000 Units Subcutaneous Q8H Irene Pap N, DO   5,000 Units at 10/06/18 6438  . hydrALAZINE (APRESOLINE) injection 10 mg  10 mg Intravenous Q6H PRN Irene Pap N, DO   10 mg at 10/03/18 1522  . isosorbide mononitrate (IMDUR) 24 hr tablet 60 mg  60 mg Oral Daily Irene Pap N, DO   60 mg at 10/05/18 1000  . lactulose (CHRONULAC) 10 GM/15ML solution 30 g  30 g Oral Daily PRN Irene Pap N, DO      . lamoTRIgine (LAMICTAL) tablet 50 mg  50 mg Oral Daily Hall, Carole N, DO   50 mg at 10/05/18 1000  . latanoprost (XALATAN) 0.005 % ophthalmic solution 1 drop  1 drop Both Eyes QHS Hall, Carole N, DO   1 drop at 10/05/18 2142  . LORazepam (ATIVAN) tablet 0.5 mg  0.5 mg Oral QHS Hall, Carole N, DO   0.5 mg at  10/05/18 2142  . losartan (COZAAR) tablet 50 mg  50 mg Oral QPM Hall, Carole N, DO   50 mg at 10/05/18 1753  . multivitamin (RENA-VIT) tablet 1 tablet  1 tablet Oral Daily Irene Pap N, DO   1 tablet at 10/05/18 1000  . valACYclovir (VALTREX) tablet 500 mg  500 mg Oral QODAY Hall, Carole N, DO   500 mg at 10/05/18 1000     Discharge Medications: Please see discharge summary for a list of discharge medications.  Relevant Imaging Results:  Relevant Lab Results:   Additional Information SSN: [id number] ; Dialysis patient- [place], LCSW

## 2018-10-06 NOTE — Care Management Important Message (Signed)
Important Message  Patient Details  Name: [name] MRN: [id number] Date of Birth: December 22, 1940   Medicare Important Message Given:  Yes    [name] Montine Circle 10/06/2018, 3:35 PM

## 2018-10-06 NOTE — Progress Notes (Signed)
PROGRESS NOTE  [name] WIO:[id number] DOB: 18-Mar-1941 DOA: 10/03/2018 PCP: Hendricks Limes, MD  HPI/Recap of past 24 hours: [name] is a 78 y.o. female with medical history significant for end-stage renal disease on hemodialysis Tuesday Thursday Saturday, multiple myeloma, dementia, hypertension, chronic diastolic CHF, chronic hyponatremia, ambulatory dysfunction wheelchair-bound who presented to [facility] ED from SNF due to sudden change in behavior.  Patient is unable to provide a history due to altered mental status.  History is obtained from her nursing facility and from medical records.  Per RN at SNF patient became suddenly confused, pulling out her IVs being uncooperative and aggressive. Blood pressure was excessively elevated.  Patient was brought to the ED for further evaluation.  Per RN patient has had intermittent change in behavior in the last few months but just not as severe.   Patient has been refusing hemodialysis.  Recently discharged on IV Zosyn for Achromobacter bacteremia.  TRH asked to admit.  10/04/2018: Pulling at her IV and attempted to get out of bed.  She had a fall overnight.  CT head and neck revealed no acute intracranial findings, fracture or subluxation. 10/05/2018: More alert today and interactive.  Ongoing treatment for MSSA and Streptococcus agalactiae bacteremia.  Infectious disease following.    10/06/2018: Patient seen and examined at bedside.  No acute events overnight.  This morning she is alert but confused.  Confusion appears to wax and wane.  She denies pain.    Assessment/Plan: Principal Problem:   Bacteremia Active Problems:   Acute metabolic encephalopathy  Acute metabolic encephalopathy possibly secondary to MSSA/Achromobacter Xylosixidans Presented with altered mental status pulling at IV and being aggressive Confusion appears to wax and wane On Ancef and Fortaz 3 times a week after dialysis Infectious disease following Blood cultures drawn  on 10/03/2018 revealed MSSA pansensitive and Achromobacter sensitive to ceftazidime Repeated blood cultures x2 peripherally done on 10/04/2018 and 10/06/2018 are negative to date Had a fall 10/04/2018.  CT head unremarkable for any acute intracranial findings.  CT neck unremarkable for any fracture or subluxation. Reorient as needed Fall precautions in place  MSSA/Achromobacter bacteremia Management as stated above ID recommends to hold off endocarditis work-up given her comorbidities Defer HD cath removal to nephrology Afebrile with no leukocytosis  Anemia of chronic disease Drop in hemoglobin from 7.5-7.0 Will be transfused PRBC at dialysis tomorrow Sign of overt bleeding MCV 105.8 B12 and folate levels normal Repeat CBC in the morning  Dysphagia Speech therapy recommended dysphagia 1 with thin liquids Aspiration precautions  Chronic hyponatremia Sodium 129 Appears euvolemic Electrolytes and volume status managed by nephrology  Resolved hypertensive emergency Presented with diastolic blood pressures in the 150's and altered mental status Resume home antihypertensive medications: Cozaar 50 mg nightly, Imdur 60 mg daily, clonidine patch 0.2 mg every 7 days, amlodipine 10 mg daily IV hydralazine 10 mg every 6 hours as needed for systolic blood pressure greater than 384 or diastolic greater than 536  Intermittent sinus bradycardia Obtain twelve-lead EKG On medications that can affect her heart rate which includes amlodipine and clonidine Continue to monitor vital signs closely on telemetry  End-stage renal disease on hemodialysis Tuesday Thursday Saturday Patient has refused hemodialysis multiple times in the past Hemodialysis per nephrology  Multiple myeloma Patient undergoes radiation once a week Follows with oncology outpatient at [facility] history of bacteremia Blood cultures drawn on 08/25/2018 showed multi- drug-resistant Acromobacter On IV Zosyn per her RN at  SNF  Chronic diastolic CHF Does  not appear to be in exacerbation Last 2D echo done on 08/27/2018 revealed preserved LVEF and grade 1 diastolic dysfunction Continue cardiac medications  Moderate pulmonary hypertension Pulmonary arterial pressure 44 mmHg No evidence of vegetation Follows with cardiology outpatient  Physical debility/ambulatory dysfunction Wheelchair-bound per RN at [facility] facility Fall precautions PT assessed and recommended SNF with 24-hour supervision/assistance CSW consulted to assist with placement.  Highly appreciated.  Risks: Patient is high risk for the compensation due to acute metabolic encephalopathy suspect secondary to hypertensive emergency, missing sessions of hemodialysis, multiple comorbidities and advanced age.  Patient will require at least 2 midnights for further evaluation and treatment of present condition.  DVT prophylaxis: Subcu heparin 5000 units 3 times daily  Code Status: Full code confirmed by her daughter on the phone on 10/04/2018  Family Communication:  Updated daughter on the phone on 10/04/2018  Disposition Plan:  Back to SNF when hemodynamically stable.  Consults called: Nephrology and infectious disease      Objective: Vitals:   10/05/18 1409 10/05/18 2212 10/06/18 0520 10/06/18 1354  BP: 119/88 (!) 128/93 (!) 141/92 125/86  Pulse:  (!) 59 (!) 52 (!) 52  Resp: 18     Temp:  98.6 F (37 C) 98 F (36.7 C) 98.4 F (36.9 C)  TempSrc:  Oral Oral Oral  SpO2: 100% 100% 100% 100%  Weight:        Intake/Output Summary (Last 24 hours) at 10/06/2018 1705 Last data filed at 10/06/2018 1100 Gross per 24 hour  Intake 340 ml  Output -  Net 340 ml   Filed Weights   10/03/18 1621 10/04/18 0745 10/04/18 1115  Weight: 53.9 kg 57 kg 54.9 kg    Exam:  . General: 78 y.o. year-old female chronically ill-appearing in no acute distress.  Alert and confused.   . Cardiovascular: Regular rate and rhythm with no rubs or gallops.  No  JVD or thyromegaly. Marland Kitchen Respiratory: Clear to auscultation with no wheezes no rales.  Poor inspiratory effort.   . Abdomen: Soft nontender nondistended with normal bowel sounds x4 quadrants. . Musculoskeletal: No lower extremity edema. 2/4 pulses in all 4 extremities. Marland Kitchen Psychiatry: Unable to assess mood due to altered mental status.   Data Reviewed: CBC: Recent Labs  Lab 10/03/18 0325 10/04/18 0519 10/05/18 0300 10/05/18 1359 10/06/18 0349  WBC 4.3 4.7 3.9*  --  4.0  NEUTROABS 3.5 3.8 2.7  --  2.6  HGB 10.8* 8.0* 7.5*  --  7.0*  HCT 36.9 26.9* 25.3* 21.9* 23.6*  MCV 103.4* 106.3* 104.5*  --  105.8*  PLT 149* 193 164  --  073   Basic Metabolic Panel: Recent Labs  Lab 10/03/18 0325 10/04/18 0519 10/05/18 0300 10/06/18 0349  NA 131* 131* 131* 129*  K 4.4 4.3 3.7 3.8  CL 95* 97* 97* 96*  CO2 _0 GLUCOSE 85 76 71 81  BUN 30* 39* 19 35*  CREATININE 7.27* 8.38* 5.01* 5.95*  CALCIUM 9.4 8.8* 8.4* 8.7*   GFR: Estimated Creatinine Clearance: 6.6 mL/min (A) (by C-G formula based on SCr of 5.95 mg/dL (H)). Liver Function Tests: Recent Labs  Lab 10/03/18 0325  AST 18  ALT 8  ALKPHOS 49  BILITOT 0.2*  PROT 9.3*  ALBUMIN 2.2*   No results for input(s): LIPASE, AMYLASE in the last 168 hours. Recent Labs  Lab 10/03/18 0326  AMMONIA 19   Coagulation Profile: No results for input(s): INR, PROTIME in the last 168 hours. Cardiac  Enzymes: No results for input(s): CKTOTAL, CKMB, CKMBINDEX, TROPONINI in the last 168 hours. BNP (last 3 results) No results for input(s): PROBNP in the last 8760 hours. HbA1C: No results for input(s): HGBA1C in the last 72 hours. CBG: Recent Labs  Lab 10/03/18 0347  GLUCAP 83   Lipid Profile: No results for input(s): CHOL, HDL, LDLCALC, TRIG, CHOLHDL, LDLDIRECT in the last 72 hours. Thyroid Function Tests: No results for input(s): TSH, T4TOTAL, FREET4, T3FREE, THYROIDAB in the last 72 hours. Anemia Panel: Recent Labs     10/05/18 1359  VITAMINB12 600  FERRITIN 1,623*  TIBC NOT CALCULATED  IRON 60  RETICCTPCT 2.0   Urine analysis:    Component Value Date/Time   COLORURINE STRAW (A) 09/16/2018 0326   APPEARANCEUR CLEAR 09/16/2018 0326   LABSPEC 1.010 09/16/2018 0326   PHURINE 9.0 (H) 09/16/2018 0326   GLUCOSEU 50 (A) 09/16/2018 0326   HGBUR NEGATIVE 09/16/2018 0326   BILIRUBINUR NEGATIVE 09/16/2018 0326   KETONESUR 5 (A) 09/16/2018 0326   PROTEINUR 100 (A) 09/16/2018 0326   UROBILINOGEN 0.2 04/10/2015 1800   NITRITE NEGATIVE 09/16/2018 0326   LEUKOCYTESUR NEGATIVE 09/16/2018 0326   Sepsis Labs: _0 (procalcitonin:4,lacticidven:4)  ) Recent Results (from the past 240 hour(s))  Blood culture (routine x 2)     Status: Abnormal   Collection Time: 10/03/18  7:30 AM  Result Value Ref Range Status   Specimen Description BLOOD RIGHT HAND  Final   Special Requests   Final    BOTTLES DRAWN AEROBIC AND ANAEROBIC Blood Culture adequate volume Performed at [facility], [place] [address] [place], [place] [address]    Culture  Setup Time   Final    GRAM NEGATIVE RODS AEROBIC BOTTLE ONLY CRITICAL RESULT CALLED TO, READ BACK BY AND VERIFIED WITH: PHARMD BEN MANCHERIL [phone] [id number] FCP    Culture ACHROMOBACTER XYLOSOXIDANS (A)  Final   Report Status 10/06/2018 FINAL  Final   Organism ID, Bacteria ACHROMOBACTER XYLOSOXIDANS  Final      Susceptibility   Achromobacter xylosoxidans - MIC*    CEFEPIME 16 INTERMEDIATE Intermediate     CEFAZOLIN >=64 RESISTANT Resistant     GENTAMICIN >=16 RESISTANT Resistant     CIPROFLOXACIN >=4 RESISTANT Resistant     IMIPENEM 8 INTERMEDIATE Intermediate     TRIMETH/SULFA <=20 SENSITIVE Sensitive     CEFTAZIDIME Value in next row Sensitive      SENSITIVE4    PIP/TAZO Value in next row Sensitive      SENSITIVE<=4    * ACHROMOBACTER XYLOSOXIDANS  Blood Culture ID Panel (Reflexed)     Status: None   Collection Time: 10/03/18  7:31 AM  Result Value Ref Range  Status   Enterococcus species NOT DETECTED NOT DETECTED Final   Listeria monocytogenes NOT DETECTED NOT DETECTED Final   Staphylococcus species NOT DETECTED NOT DETECTED Final   Staphylococcus aureus (BCID) NOT DETECTED NOT DETECTED Final   Streptococcus species NOT DETECTED NOT DETECTED Final   Streptococcus agalactiae NOT DETECTED NOT DETECTED Final   Streptococcus pneumoniae NOT DETECTED NOT DETECTED Final   Streptococcus pyogenes NOT DETECTED NOT DETECTED Final   Acinetobacter baumannii NOT DETECTED NOT DETECTED Final   Enterobacteriaceae species NOT DETECTED NOT DETECTED Final   Enterobacter cloacae complex NOT DETECTED NOT DETECTED Final   Escherichia coli NOT DETECTED NOT DETECTED Final   Klebsiella oxytoca NOT DETECTED NOT DETECTED Final   Klebsiella pneumoniae NOT DETECTED NOT DETECTED Final   Proteus species NOT DETECTED NOT DETECTED Final  Serratia marcescens NOT DETECTED NOT DETECTED Final   Haemophilus influenzae NOT DETECTED NOT DETECTED Final   Neisseria meningitidis NOT DETECTED NOT DETECTED Final   Pseudomonas aeruginosa NOT DETECTED NOT DETECTED Final   Candida albicans NOT DETECTED NOT DETECTED Final   Candida glabrata NOT DETECTED NOT DETECTED Final   Candida krusei NOT DETECTED NOT DETECTED Final   Candida parapsilosis NOT DETECTED NOT DETECTED Final   Candida tropicalis NOT DETECTED NOT DETECTED Final  Blood culture (routine x 2)     Status: Abnormal   Collection Time: 10/03/18  7:33 AM  Result Value Ref Range Status   Specimen Description BLOOD RIGHT ANTECUBITAL  Final   Special Requests   Final    BOTTLES DRAWN AEROBIC AND ANAEROBIC Blood Culture results may not be optimal due to an inadequate volume of blood received in culture bottles   Culture  Setup Time   Final    GRAM POSITIVE COCCI AEROBIC BOTTLE ONLY Organism ID to follow CRITICAL RESULT CALLED TO, READ BACK BY AND VERIFIED WITHKarsten Ro PHARMD 1007 10/04/18 A BROWNING Performed at [facility], [address], [place], [place] [address]    Culture STAPHYLOCOCCUS AUREUS (A)  Final   Report Status 10/06/2018 FINAL  Final   Organism ID, Bacteria STAPHYLOCOCCUS AUREUS  Final      Susceptibility   Staphylococcus aureus - MIC*    CIPROFLOXACIN <=0.5 SENSITIVE Sensitive     ERYTHROMYCIN <=0.25 SENSITIVE Sensitive     GENTAMICIN <=0.5 SENSITIVE Sensitive     OXACILLIN 0.5 SENSITIVE Sensitive     TETRACYCLINE <=1 SENSITIVE Sensitive     VANCOMYCIN <=0.5 SENSITIVE Sensitive     TRIMETH/SULFA <=10 SENSITIVE Sensitive     CLINDAMYCIN <=0.25 SENSITIVE Sensitive     RIFAMPIN <=0.5 SENSITIVE Sensitive     Inducible Clindamycin NEGATIVE Sensitive     * STAPHYLOCOCCUS AUREUS  Blood Culture ID Panel (Reflexed)     Status: Abnormal   Collection Time: 10/03/18  7:33 AM  Result Value Ref Range Status   Enterococcus species NOT DETECTED NOT DETECTED Final   Listeria monocytogenes NOT DETECTED NOT DETECTED Final   Staphylococcus species DETECTED (A) NOT DETECTED Final    Comment: CRITICAL RESULT CALLED TO, READ BACK BY AND VERIFIED WITH: J [facility] PHARMD 0240 10/04/18 A BROWNING    Staphylococcus aureus (BCID) DETECTED (A) NOT DETECTED Final    Comment: Methicillin (oxacillin) susceptible Staphylococcus aureus (MSSA). Preferred therapy is anti staphylococcal beta lactam antibiotic (Cefazolin or Nafcillin), unless clinically contraindicated. CRITICAL RESULT CALLED TO, READ BACK BY AND VERIFIED WITH: Karsten Ro PHARMD 0240 10/04/18 A BROWNING    Methicillin resistance NOT DETECTED NOT DETECTED Final   Streptococcus species NOT DETECTED NOT DETECTED Final   Streptococcus agalactiae NOT DETECTED NOT DETECTED Final   Streptococcus pneumoniae NOT DETECTED NOT DETECTED Final   Streptococcus pyogenes NOT DETECTED NOT DETECTED Final   Acinetobacter baumannii NOT DETECTED NOT DETECTED Final   Enterobacteriaceae species NOT DETECTED NOT DETECTED Final   Enterobacter cloacae complex NOT DETECTED  NOT DETECTED Final   Escherichia coli NOT DETECTED NOT DETECTED Final   Klebsiella oxytoca NOT DETECTED NOT DETECTED Final   Klebsiella pneumoniae NOT DETECTED NOT DETECTED Final   Proteus species NOT DETECTED NOT DETECTED Final   Serratia marcescens NOT DETECTED NOT DETECTED Final   Haemophilus influenzae NOT DETECTED NOT DETECTED Final   Neisseria meningitidis NOT DETECTED NOT DETECTED Final   Pseudomonas aeruginosa NOT DETECTED NOT DETECTED Final   Candida  albicans NOT DETECTED NOT DETECTED Final   Candida glabrata NOT DETECTED NOT DETECTED Final   Candida krusei NOT DETECTED NOT DETECTED Final   Candida parapsilosis NOT DETECTED NOT DETECTED Final   Candida tropicalis NOT DETECTED NOT DETECTED Final    Comment: Performed at [facility], [place] [address] [place], [place] [address]  MRSA PCR Screening     Status: None   Collection Time: 10/03/18  6:46 PM  Result Value Ref Range Status   MRSA by PCR NEGATIVE NEGATIVE Final    Comment:        The GeneXpert MRSA Assay (FDA approved for NASAL specimens only), is one component of a comprehensive MRSA colonization surveillance program. It is not intended to diagnose MRSA infection nor to guide or monitor treatment for MRSA infections. Performed at [facility], [place] [address], [place], [place] [address]   Culture, blood (Routine X 2) w Reflex to ID Panel     Status: None (Preliminary result)   Collection Time: 10/04/18  1:18 PM  Result Value Ref Range Status   Specimen Description BLOOD BLOOD LEFT HAND  Final   Special Requests AEROBIC BOTTLE ONLY Blood Culture adequate volume  Final   Culture NO GROWTH 2 DAYS  Final   Report Status PENDING  Incomplete  Culture, blood (routine x 2)     Status: None (Preliminary result)   Collection Time: 10/06/18 11:13 AM  Result Value Ref Range Status   Specimen Description BLOOD RIGHT FOREARM  Final   Special Requests   Final    BOTTLES DRAWN AEROBIC AND ANAEROBIC Blood Culture  adequate volume Performed at [facility], [place] [address] [place], [place] [address]    Culture NO GROWTH < 12 HOURS  Final   Report Status PENDING  Incomplete  Culture, blood (routine x 2)     Status: None (Preliminary result)   Collection Time: 10/06/18 11:18 AM  Result Value Ref Range Status   Specimen Description BLOOD RIGHT HAND  Final   Special Requests   Final    BOTTLES DRAWN AEROBIC AND ANAEROBIC Blood Culture adequate volume Performed at [facility], [place] [address], [place],  [address]    Culture NO GROWTH < 12 HOURS  Final   Report Status PENDING  Incomplete      Studies: No results found.  Scheduled Meds: . sodium chloride   Intravenous Once  . amLODipine  10 mg Oral QPM  . calcium acetate  1,334 mg Oral TID WC  . Chlorhexidine Gluconate Cloth  6 each Topical Q0600  . [START ON 10/07/2018] Chlorhexidine Gluconate Cloth  6 each Topical Q0600  . [START ON 10/07/2018] cloNIDine  0.2 mg Transdermal Weekly  . [START ON 10/07/2018] doxercalciferol  2 mcg Intravenous Q T,Th,Sa-HD  . feeding supplement (PRO-STAT SUGAR FREE 64)  30 mL Oral BID  . heparin injection (subcutaneous)  5,000 Units Subcutaneous Q8H  . isosorbide mononitrate  60 mg Oral Daily  . lamoTRIgine  50 mg Oral Daily  . latanoprost  1 drop Both Eyes QHS  . LORazepam  0.5 mg Oral QHS  . losartan  50 mg Oral QPM  . multivitamin  1 tablet Oral Daily  . valACYclovir  500 mg Oral QODAY    Continuous Infusions: . [START ON 10/07/2018]  ceFAZolin (ANCEF) IV    . [START ON 10/07/2018] cefTAZidime (FORTAZ)  IV       LOS: 3 days     Archie Patten  Malachi Carl, MD [facility] Pager [phone]  If 7PM-7AM, please contact night-coverage [email or web address] Password [email or web address] 10/06/2018, 5:05 PM

## 2018-10-06 NOTE — Evaluation (Signed)
Physical Therapy Evaluation Patient Details Name: [name] MRN: [id number] DOB: [birth year] Today's Date: 10/06/2018   History of Present Illness  Patient is a 78 y/o female presenting to the ED on 10/03/17 due to AMS from SNF.  CT head unremarkable for any acute intracranial findings. PMH significant for end-stage renal disease on hemodialysis Monday Wednesday Friday, multiple myeloma, dementia, hypertension, chronic diastolic CHF, chronic hyponatremia, ambulatory dysfunction wheelchair-bound.     Clinical Impression  Ms. Jahnke is a 78 y/o female admitted with the above listed diagnosis. Patient is a poor historian with PLOF gleaned from chart review. Patient today requiring Max A for supine to/from sit with poor upright posturing and inability to maintain midline without UE/external support from PT. Per chart review from admission on 12/17 - patient requiring assist from nursing staff for all ADLs and transfers prior to admission with patient w/c bound for mobility. Will recommend return to SNF, and defer all other needs to SNF.     Follow Up Recommendations SNF;Supervision/Assistance - 24 hour    Equipment Recommendations  None recommended by PT(defer to SNF)    Recommendations for Other Services       Precautions / Restrictions Precautions Precautions: Fall Restrictions Weight Bearing Restrictions: No      Mobility  Bed Mobility Overal bed mobility: Needs Assistance Bed Mobility: Supine to Sit;Sit to Supine     Supine to sit: Max assist Sit to supine: Max assist   General bed mobility comments: Max A to come to EOB and back to supine; poor upright posturing with tendency to lean on L elbow; unable to self scoot to EOB  Transfers                 General transfer comment: did not attempt  Ambulation/Gait             General Gait Details: not tested, w/c bound baseline per chart review  Stairs            Wheelchair Mobility    Modified Rankin  (Stroke Patients Only)       Balance Overall balance assessment: Needs assistance Sitting-balance support: Bilateral upper extremity supported;Feet supported Sitting balance-Leahy Scale: Poor Sitting balance - Comments: unable to maintian midline without external support Postural control: Left lateral lean                                   Pertinent Vitals/Pain Pain Assessment: Faces Faces Pain Scale: No hurt    Home Living Family/patient expects to be discharged to:: Skilled nursing facility   Available Help at Discharge: [place] Type of Home: [place]                Prior Function Level of Independence: Needs assistance   Gait / Transfers Assistance Needed: per chart review, used w/c for primary means of mobility           Hand Dominance        Extremity/Trunk Assessment   Upper Extremity Assessment Upper Extremity Assessment: Defer to OT evaluation    Lower Extremity Assessment Lower Extremity Assessment: Generalized weakness;Difficult to assess due to impaired cognition    Cervical / Trunk Assessment Cervical / Trunk Assessment: Kyphotic  Communication   Communication: Receptive difficulties;Expressive difficulties;HOH  Cognition Arousal/Alertness: Awake/alert Behavior During Therapy: Flat affect Overall Cognitive Status: No family/caregiver present to determine baseline cognitive functioning Area of Impairment: Orientation;Memory;Following commands  Orientation Level: Disoriented to;Situation   Memory: Decreased recall of precautions;Decreased short-term memory Following Commands: Follows one step commands with increased time       General Comments: patient oriented to self, year, and place. Otherwise, patient continues to state "what is on my neck" when there was nothihng there      General Comments      Exercises     Assessment/Plan    PT Assessment All further PT  needs can be met in the next venue of care  PT Problem List Decreased strength;Decreased mobility;Decreased balance;Decreased activity tolerance;Decreased cognition;Decreased safety awareness       PT Treatment Interventions      PT Goals (Current goals can be found in the Care Plan section)  Acute Rehab PT Goals Patient Stated Goal: none stated PT Goal Formulation: Patient unable to participate in goal setting    Frequency     Barriers to discharge        Co-evaluation               AM-PAC PT "6 Clicks" Mobility  Outcome Measure Help needed turning from your back to your side while in a flat bed without using bedrails?: Total Help needed moving from lying on your back to sitting on the side of a flat bed without using bedrails?: Total Help needed moving to and from a bed to a chair (including a wheelchair)?: Total Help needed standing up from a chair using your arms (e.g., wheelchair or bedside chair)?: Total Help needed to walk in hospital room?: Total Help needed climbing 3-5 steps with a railing? : Total 6 Click Score: 6    End of Session   Activity Tolerance: Patient limited by fatigue;Patient limited by lethargy Patient left: in bed;with call bell/phone within reach;with bed alarm set Nurse Communication: Mobility status PT Visit Diagnosis: Muscle weakness (generalized) (M62.81);Other abnormalities of gait and mobility (R26.89)    Time: 0973-5329 PT Time Calculation (min) (ACUTE ONLY): 17 min   Charges:   PT Evaluation $PT Eval Moderate Complexity: 1 Mod          Lanney Gins, PT, DPT Supplemental Physical Therapist 10/06/18 12:07 PM Pager: [phone] Office: [phone]

## 2018-10-06 NOTE — Progress Notes (Addendum)
[facility] Progress Note   Subjective:   Seen and examined at bedside.  Still appears confused.  Believes she is at [facility].  Unsure what day it is.  No complaints. Denies SOB, CP, and n/v/d.  Objective Vitals:   10/05/18 1409 10/05/18 2212 10/06/18 0520 10/06/18 1354  BP: 119/88 (!) 128/93 (!) 141/92 125/86  Pulse:  (!) 59 (!) 52 (!) 52  Resp: 18     Temp:  98.6 F (37 C) 98 F (36.7 C) 98.4 F (36.9 C)  TempSrc:  Oral Oral Oral  SpO2: 100% 100% 100% 100%  Weight:       Physical Exam General:NAD, chronically ill appearing, elderly female Heart:RRR, +7/4 systolic murmur Lungs:CTAB, no wheeze, rales or rhonchi Abdomen:soft, NTND Extremities:no LE edema Dialysis Access: R TDC c/d/i, tortuous L AVF +thrill   Filed Weights   10/03/18 1621 10/04/18 0745 10/04/18 1115  Weight: 53.9 kg 57 kg 54.9 kg    Intake/Output Summary (Last 24 hours) at 10/06/2018 1453 Last data filed at 10/06/2018 1100 Gross per 24 hour  Intake 340 ml  Output -  Net 340 ml    Additional Objective Labs: Basic Metabolic Panel: Recent Labs  Lab 10/04/18 0519 10/05/18 0300 10/06/18 0349  NA 131* 131* 129*  K 4.3 3.7 3.8  CL 97* 97* 96*  CO2 _0 GLUCOSE 76 71 81  BUN 39* 19 35*  CREATININE 8.38* 5.01* 5.95*  CALCIUM 8.8* 8.4* 8.7*   Liver Function Tests: Recent Labs  Lab 10/03/18 0325  AST 18  ALT 8  ALKPHOS 49  BILITOT 0.2*  PROT 9.3*  ALBUMIN 2.2*   CBC: Recent Labs  Lab 10/03/18 0325 10/04/18 0519 10/05/18 0300 10/05/18 1359 10/06/18 0349  WBC 4.3 4.7 3.9*  --  4.0  NEUTROABS 3.5 3.8 2.7  --  2.6  HGB 10.8* 8.0* 7.5*  --  7.0*  HCT 36.9 26.9* 25.3* 21.9* 23.6*  MCV 103.4* 106.3* 104.5*  --  105.8*  PLT 149* 193 164  --  179   Blood Culture    Component Value Date/Time   SDES BLOOD RIGHT HAND 10/06/2018 1118   SPECREQUEST  10/06/2018 1118    BOTTLES DRAWN AEROBIC AND ANAEROBIC Blood Culture adequate volume Performed at [facility] [address], [place], [place] [address]    CULT NO GROWTH < 12 HOURS 10/06/2018 1118   REPTSTATUS PENDING 10/06/2018 1118   CBG: Recent Labs  Lab 10/03/18 0347  GLUCAP 83   Iron Studies:  Recent Labs    10/05/18 1359  IRON 60  TIBC NOT CALCULATED  FERRITIN 1,623*   Lab Results  Component Value Date   INR 1.15 09/16/2018   INR 1.09 08/25/2018   INR 1.30 08/12/2018   Studies/Results: No results found.  Medications: . [START ON 10/07/2018]  ceFAZolin (ANCEF) IV    . [START ON 10/07/2018] cefTAZidime (FORTAZ)  IV     . amLODipine  10 mg Oral QPM  . calcium acetate  1,334 mg Oral TID WC  . Chlorhexidine Gluconate Cloth  6 each Topical Q0600  . [START ON 10/07/2018] cloNIDine  0.2 mg Transdermal Weekly  . [START ON 10/07/2018] doxercalciferol  2 mcg Intravenous Q T,Th,Sa-HD  . feeding supplement (PRO-STAT SUGAR FREE 64)  30 mL Oral BID  . heparin injection (subcutaneous)  5,000 Units Subcutaneous Q8H  . isosorbide mononitrate  60 mg Oral Daily  . lamoTRIgine  50 mg Oral Daily  . latanoprost  1 drop Both Eyes QHS  . LORazepam  0.5 mg Oral QHS  . losartan  50 mg Oral QPM  . multivitamin  1 tablet Oral Daily  . valACYclovir  500 mg Oral QODAY    Dialysis Orders: AF TTS 3.5h 425/1.5x EDW 54kg 2K/2.25Ca  TDC No heparin bolus Hectorol 2 mcg IV TIW Retacrit 20000 U IV TIW  Assessment/Plan: 1. Acute encephalopathy - HTN enceph? Recurrent admits w/similar presentation.  Head CT w/no acute findings.   2. MSSA and GNR Bacteremia - changed to ceftazidime & cefazolin. Rechecking BC - NGTD. ID following. No endocarditis work up d/t comorbidities.  3. ESRD - On HD TTS.  K3.8. Continue per regular schedule w/3K bath  4. Anemia of CKD- Hgb trending down 8.0>7.5>7.0. Plan for transfusion w/HD tomorrow. No iron due to high ferritin.  5. Secondary hyperparathyroidism - Ca at goal.  Continue VDRA/binders.  6. HTN/volume - BP improved.  Continue meds. Does not appear volume overloaded on exam.   Close to EDW post HD.   7. Nutrition - Renal diet w/fluid restrictions. Vitamins. Prostat.  8. Multiple myeloma - followed by [facility] onc 9. FTT - seen by palliative care.  Poor prognosis.   Lindsay Penninger, PA-C [facility] Pager: [phone] 10/06/2018,2:53 PM  LOS: 3 days   Pt seen, examined and agree w A/P as above.  Rob  MD [facility] pager [phone]   10/06/2018, 3:31 PM    

## 2018-10-06 NOTE — Progress Notes (Signed)
[facility] for Infectious Disease  Date of Admission:  10/03/2018     Total days of antibiotics 2          ASSESSMENT/PLAN  [name] has acute encephalopathy and  polymicrobial bacteremia with MSSA and recurrent multi-drug resistant  Achromobacter xylosoxidans with unknown source of infection. She has remained afebrile with no leukocytosis and currently on Day 2 of cefepime changed from Ancef. Repeat cultures from 11/4 are without growth to date. Sensitivities show resistance of Achromobacter to cefepime and sensitive to ceftazidime and piperacillin-tazobactam per micro. Will change antibiotics to ceftazidime. There is concern with ceftazidime coverage for MSSA and will add cefazolin as well. Given repeat infections with Achrombacter seeded HD catheter must be considered despite no overt signs of infection. No other sources of infection are apparent on exam.   1. Change cefepime to Ceftazidime and Cefazolin. 2. Recheck blood cultures 3. Anemia per primary and nephrology. 4. Continue to monitor cultures, fevers, and WBC count.    Principal Problem:   Bacteremia Active Problems:   Acute metabolic encephalopathy   . amLODipine  10 mg Oral QPM  . calcium acetate  1,334 mg Oral TID WC  . Chlorhexidine Gluconate Cloth  6 each Topical Q0600  . [START ON 10/07/2018] cloNIDine  0.2 mg Transdermal Weekly  . [START ON 10/07/2018] doxercalciferol  2 mcg Intravenous Q T,Th,Sa-HD  . feeding supplement (PRO-STAT SUGAR FREE 64)  30 mL Oral BID  . heparin injection (subcutaneous)  5,000 Units Subcutaneous Q8H  . isosorbide mononitrate  60 mg Oral Daily  . lamoTRIgine  50 mg Oral Daily  . latanoprost  1 drop Both Eyes QHS  . LORazepam  0.5 mg Oral QHS  . losartan  50 mg Oral QPM  . multivitamin  1 tablet Oral Daily  . valACYclovir  500 mg Oral QODAY    SUBJECTIVE:  Afebrile overnight with no leukocytosis. 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 No acute  events overnight.   Concerned about her telemetry box and keeps saying it is "heavy and wanting it off."  Allergies  Allergen Reactions  . Phenergan [Promethazine Hcl] Other (See Comments)    Acute encephalopathy in the context of refusal to go to hemodialysis and administration of Phenergan for nausea and vomiting     Review of Systems: Review of Systems  Constitutional: Negative for chills and fever.  Respiratory: Negative for cough and shortness of breath.   Cardiovascular: Negative for chest pain and leg swelling.  Gastrointestinal: Negative for abdominal pain, constipation, diarrhea, nausea and vomiting.  Skin: Negative for rash.      OBJECTIVE: Vitals:   10/05/18 0438 10/05/18 1409 10/05/18 2212 10/06/18 0520  BP: (!) 146/90 119/88 (!) 128/93 (!) 141/92  Pulse: (!) 58  (!) 59 (!) 52  Resp: 20 18    Temp:   98.6 F (37 C) 98 F (36.7 C)  TempSrc:   Oral Oral  SpO2: 100% 100% 100% 100%  Weight:       Body mass index is 19.54 kg/m.  Physical Exam Constitutional:      General: She is not in acute distress.    Appearance: She is well-developed.     Comments: Lying in bed with head of bed elevated.   Cardiovascular:     Rate and Rhythm: Normal rate and regular rhythm.     Heart sounds: Normal heart sounds.     Comments: Dialysis catheter without induration or signs of infection.  Pulmonary:     Effort: Pulmonary effort is normal.     Breath sounds: Normal breath sounds. No wheezing, rhonchi or rales.  Abdominal:     General: Abdomen is flat. Bowel sounds are normal. There is no distension.     Palpations: There is no mass.     Tenderness: There is no abdominal tenderness. There is no guarding or rebound.  Skin:    General: Skin is warm and dry.  Neurological:     Mental Status: She is alert.     Comments: She is oriented to person, place and time but was confused about the date.   Psychiatric:        Mood and Affect: Mood is anxious.     Lab  Results Lab Results  Component Value Date   WBC 4.0 10/06/2018   HGB 7.0 (L) 10/06/2018   HCT 23.6 (L) 10/06/2018   MCV 105.8 (H) 10/06/2018   PLT 179 10/06/2018    Lab Results  Component Value Date   CREATININE 5.95 (H) 10/06/2018   BUN 35 (H) 10/06/2018   NA 129 (L) 10/06/2018   K 3.8 10/06/2018   CL 96 (L) 10/06/2018   CO2 25 10/06/2018    Lab Results  Component Value Date   ALT 8 10/03/2018   AST 18 10/03/2018   ALKPHOS 49 10/03/2018   BILITOT 0.2 (L) 10/03/2018     Microbiology: Recent Results (from the past 240 hour(s))  Blood culture (routine x 2)     Status: Abnormal (Preliminary result)   Collection Time: 10/03/18  7:30 AM  Result Value Ref Range Status   Specimen Description BLOOD RIGHT HAND  Final   Special Requests   Final    BOTTLES DRAWN AEROBIC AND ANAEROBIC Blood Culture adequate volume   Culture  Setup Time   Final    GRAM NEGATIVE RODS AEROBIC BOTTLE ONLY CRITICAL RESULT CALLED TO, READ BACK BY AND VERIFIED WITH: PHARMD BEN MANCHERIL [id number] FCP    Culture ACHROMOBACTER XYLOSOXIDANS (A)  Final   Report Status PENDING  Incomplete   Organism ID, Bacteria ACHROMOBACTER XYLOSOXIDANS  Final      Susceptibility   Achromobacter xylosoxidans - MIC*    CEFEPIME 16 INTERMEDIATE Intermediate     CEFAZOLIN >=64 RESISTANT Resistant     GENTAMICIN >=16 RESISTANT Resistant     CIPROFLOXACIN >=4 RESISTANT Resistant     IMIPENEM 8 INTERMEDIATE Intermediate     TRIMETH/SULFA <=20 SENSITIVE Sensitive     CEFTAZIDIME Value in next row Sensitive      SENSITIVE4    PIP/TAZO Value in next row Sensitive      SENSITIVE<=4Performed at [facility], [address], [place], [place] [address]    * ACHROMOBACTER XYLOSOXIDANS  Blood Culture ID Panel (Reflexed)     Status: None   Collection Time: 10/03/18  7:31 AM  Result Value Ref Range Status   Enterococcus species NOT DETECTED NOT DETECTED Final   Listeria monocytogenes NOT DETECTED NOT DETECTED Final    Staphylococcus species NOT DETECTED NOT DETECTED Final   Staphylococcus aureus (BCID) NOT DETECTED NOT DETECTED Final   Streptococcus species NOT DETECTED NOT DETECTED Final   Streptococcus agalactiae NOT DETECTED NOT DETECTED Final   Streptococcus pneumoniae NOT DETECTED NOT DETECTED Final   Streptococcus pyogenes NOT DETECTED NOT DETECTED Final   Acinetobacter baumannii NOT DETECTED NOT DETECTED Final   Enterobacteriaceae species NOT DETECTED NOT DETECTED Final   Enterobacter cloacae complex NOT DETECTED NOT DETECTED  Final   Escherichia coli NOT DETECTED NOT DETECTED Final   Klebsiella oxytoca NOT DETECTED NOT DETECTED Final   Klebsiella pneumoniae NOT DETECTED NOT DETECTED Final   Proteus species NOT DETECTED NOT DETECTED Final   Serratia marcescens NOT DETECTED NOT DETECTED Final   Haemophilus influenzae NOT DETECTED NOT DETECTED Final   Neisseria meningitidis NOT DETECTED NOT DETECTED Final   Pseudomonas aeruginosa NOT DETECTED NOT DETECTED Final   Candida albicans NOT DETECTED NOT DETECTED Final   Candida glabrata NOT DETECTED NOT DETECTED Final   Candida krusei NOT DETECTED NOT DETECTED Final   Candida parapsilosis NOT DETECTED NOT DETECTED Final   Candida tropicalis NOT DETECTED NOT DETECTED Final  Blood culture (routine x 2)     Status: Abnormal   Collection Time: 10/03/18  7:33 AM  Result Value Ref Range Status   Specimen Description BLOOD RIGHT ANTECUBITAL  Final   Special Requests   Final    BOTTLES DRAWN AEROBIC AND ANAEROBIC Blood Culture results may not be optimal due to an inadequate volume of blood received in culture bottles   Culture  Setup Time   Final    GRAM POSITIVE COCCI AEROBIC BOTTLE ONLY Organism ID to follow CRITICAL RESULT CALLED TO, READ BACK BY AND VERIFIED WITHKarsten Ro PHARMD [id number] 10/04/18 A BROWNING Performed at [facility], [address] [place], [place] [address]    Culture STAPHYLOCOCCUS AUREUS (A)  Final   Report Status 10/06/2018  FINAL  Final   Organism ID, Bacteria STAPHYLOCOCCUS AUREUS  Final      Susceptibility   Staphylococcus aureus - MIC*    CIPROFLOXACIN <=0.5 SENSITIVE Sensitive     ERYTHROMYCIN <=0.25 SENSITIVE Sensitive     GENTAMICIN <=0.5 SENSITIVE Sensitive     OXACILLIN 0.5 SENSITIVE Sensitive     TETRACYCLINE <=1 SENSITIVE Sensitive     VANCOMYCIN <=0.5 SENSITIVE Sensitive     TRIMETH/SULFA <=10 SENSITIVE Sensitive     CLINDAMYCIN <=0.25 SENSITIVE Sensitive     RIFAMPIN <=0.5 SENSITIVE Sensitive     Inducible Clindamycin NEGATIVE Sensitive     * STAPHYLOCOCCUS AUREUS  Blood Culture ID Panel (Reflexed)     Status: Abnormal   Collection Time: 10/03/18  7:33 AM  Result Value Ref Range Status   Enterococcus species NOT DETECTED NOT DETECTED Final   Listeria monocytogenes NOT DETECTED NOT DETECTED Final   Staphylococcus species DETECTED (A) NOT DETECTED Final    Comment: CRITICAL RESULT CALLED TO, READ BACK BY AND VERIFIED WITH: J [facility] PHARMD [id number] 10/04/18 A BROWNING    Staphylococcus aureus (BCID) DETECTED (A) NOT DETECTED Final    Comment: Methicillin (oxacillin) susceptible Staphylococcus aureus (MSSA). Preferred therapy is anti staphylococcal beta lactam antibiotic (Cefazolin or Nafcillin), unless clinically contraindicated. CRITICAL RESULT CALLED TO, READ BACK BY AND VERIFIED WITH: Karsten Ro PHARMD 0240 10/04/18 A BROWNING    Methicillin resistance NOT DETECTED NOT DETECTED Final   Streptococcus species NOT DETECTED NOT DETECTED Final   Streptococcus agalactiae NOT DETECTED NOT DETECTED Final   Streptococcus pneumoniae NOT DETECTED NOT DETECTED Final   Streptococcus pyogenes NOT DETECTED NOT DETECTED Final   Acinetobacter baumannii NOT DETECTED NOT DETECTED Final   Enterobacteriaceae species NOT DETECTED NOT DETECTED Final   Enterobacter cloacae complex NOT DETECTED NOT DETECTED Final   Escherichia coli NOT DETECTED NOT DETECTED Final   Klebsiella oxytoca NOT DETECTED NOT DETECTED Final    Klebsiella pneumoniae NOT DETECTED NOT DETECTED Final   Proteus species NOT DETECTED NOT DETECTED Final  Serratia marcescens NOT DETECTED NOT DETECTED Final   Haemophilus influenzae NOT DETECTED NOT DETECTED Final   Neisseria meningitidis NOT DETECTED NOT DETECTED Final   Pseudomonas aeruginosa NOT DETECTED NOT DETECTED Final   Candida albicans NOT DETECTED NOT DETECTED Final   Candida glabrata NOT DETECTED NOT DETECTED Final   Candida krusei NOT DETECTED NOT DETECTED Final   Candida parapsilosis NOT DETECTED NOT DETECTED Final   Candida tropicalis NOT DETECTED NOT DETECTED Final    Comment: Performed at [facility], [place] [address] [place], [place] [address]  MRSA PCR Screening     Status: None   Collection Time: 10/03/18  6:46 PM  Result Value Ref Range Status   MRSA by PCR NEGATIVE NEGATIVE Final    Comment:        The GeneXpert MRSA Assay (FDA approved for NASAL specimens only), is one component of a comprehensive MRSA colonization surveillance program. It is not intended to diagnose MRSA infection nor to guide or monitor treatment for MRSA infections. Performed at [facility], [place] [name] [place], [place] [address]   Culture, blood (Routine X 2) w Reflex to ID Panel     Status: None (Preliminary result)   Collection Time: 10/04/18  1:18 PM  Result Value Ref Range Status   Specimen Description BLOOD BLOOD LEFT HAND  Final   Special Requests AEROBIC BOTTLE ONLY Blood Culture adequate volume  Final   Culture   Final    NO GROWTH 1 DAY Performed at [facility], [place] [address] [place], [place] [address]    Report Status PENDING  Incomplete     Terri Piedra, [facility] Pager  10/06/2018  11:39 AM

## 2018-10-07 ENCOUNTER — Other Ambulatory Visit: Payer: Self-pay

## 2018-10-07 DIAGNOSIS — I158 Other secondary hypertension: Secondary | ICD-10-CM

## 2018-10-07 DIAGNOSIS — R410 Disorientation, unspecified: Secondary | ICD-10-CM

## 2018-10-07 DIAGNOSIS — R451 Restlessness and agitation: Secondary | ICD-10-CM

## 2018-10-07 DIAGNOSIS — Z66 Do not resuscitate: Secondary | ICD-10-CM

## 2018-10-07 DIAGNOSIS — F0390 Unspecified dementia without behavioral disturbance: Secondary | ICD-10-CM

## 2018-10-07 LAB — BASIC METABOLIC PANEL
Anion gap: 13 (ref 5–15)
BUN: 55 mg/dL — ABNORMAL HIGH (ref 8–23)
CHLORIDE: 95 mmol/L — AB (ref 98–111)
CO2: 21 mmol/L — ABNORMAL LOW (ref 22–32)
Calcium: 9.5 mg/dL (ref 8.9–10.3)
Creatinine, Ser: 7.39 mg/dL — ABNORMAL HIGH (ref 0.44–1.00)
GFR calc non Af Amer: 5 mL/min — ABNORMAL LOW (ref >60)
GFR, EST AFRICAN AMERICAN: 6 mL/min — AB (ref >60)
Glucose, Bld: 95 mg/dL (ref 70–99)
Potassium: 4 mmol/L (ref 3.5–5.1)
SODIUM: 129 mmol/L — AB (ref 135–145)

## 2018-10-07 LAB — CBC WITH DIFFERENTIAL/PLATELET
Abs Immature Granulocytes: 0.07 10*3/uL (ref 0.00–0.07)
BASOS ABS: 0 10*3/uL (ref 0.0–0.1)
Basophils Relative: 0 %
Eosinophils Absolute: 0 10*3/uL (ref 0.0–0.5)
Eosinophils Relative: 0 %
HCT: 27.6 % — ABNORMAL LOW (ref 36.0–46.0)
Hemoglobin: 8.3 g/dL — ABNORMAL LOW (ref 12.0–15.0)
Immature Granulocytes: 1 %
LYMPHS ABS: 0.6 10*3/uL — AB (ref 0.7–4.0)
Lymphocytes Relative: 12 %
MCH: 31.8 pg (ref 26.0–34.0)
MCHC: 30.1 g/dL (ref 30.0–36.0)
MCV: 105.7 fL — ABNORMAL HIGH (ref 80.0–100.0)
Monocytes Absolute: 0.6 10*3/uL (ref 0.1–1.0)
Monocytes Relative: 12 %
NRBC: 0 % (ref 0.0–0.2)
Neutro Abs: 4 10*3/uL (ref 1.7–7.7)
Neutrophils Relative %: 75 %
Platelets: 232 10*3/uL (ref 150–400)
RBC: 2.61 MIL/uL — ABNORMAL LOW (ref 3.87–5.11)
RDW: 18.1 % — ABNORMAL HIGH (ref 11.5–15.5)
WBC: 5.3 10*3/uL (ref 4.0–10.5)

## 2018-10-07 LAB — PHOSPHORUS: PHOSPHORUS: 3 mg/dL (ref 2.5–4.6)

## 2018-10-07 MED ORDER — HEPARIN SODIUM (PORCINE) 1000 UNIT/ML IJ SOLN
INTRAMUSCULAR | Status: AC
  Administered 2018-10-07: 1000 [IU]
  Filled 2018-10-07: qty 4

## 2018-10-07 MED ORDER — ORAL CARE MOUTH RINSE
15.0000 mL | Freq: Two times a day (BID) | OROMUCOSAL | Status: DC
  Administered 2018-10-07 – 2018-10-09 (×5): 15 mL via OROMUCOSAL

## 2018-10-07 MED ORDER — DOXERCALCIFEROL 4 MCG/2ML IV SOLN
INTRAVENOUS | Status: AC
  Administered 2018-10-07: 2 ug via INTRAVENOUS
  Filled 2018-10-07: qty 2

## 2018-10-07 MED ORDER — HALOPERIDOL LACTATE 5 MG/ML IJ SOLN
1.0000 mg | Freq: Once | INTRAMUSCULAR | Status: AC
  Administered 2018-10-07: 1 mg via INTRAVENOUS
  Filled 2018-10-07: qty 1

## 2018-10-07 NOTE — Progress Notes (Signed)
Patient confused, intermittently yelling out, pulling off tele, pulling off gown, trying to get out of bed. Patient uncooperative with assessment but cooperative taking HS meds. Ativan given PO with HS meds as ordered. Patient resting in bed at this time. Will continue to monitor.

## 2018-10-07 NOTE — Progress Notes (Signed)
Patient was very anxious, combative, thrashing arms, hitting side rails. Patient not consolable. Seizure pads applied to bed side rails. Baltazar Najjar, NP notified. Haldol 1 mg given IV per orders. Patient still restless in bed at this time but has improved. Will continue to monitor.

## 2018-10-07 NOTE — Progress Notes (Addendum)
Patient Hypertensive, RN assessed IV patency to administer PRN medication and IV site leaking.  RN removed dressing to assess site and IV completely out of patient's arm.  IV removed and RN placed stat IV team consult to obtain IV site for medication administration. HD RN notified as patient being transported to HD.

## 2018-10-07 NOTE — Progress Notes (Signed)
Palliative Care Progress Note  Initial Consult on prior hospitalization 08/31/2018. I spoke with patient's daughter at length regarding her condition, progressive decline and the situation facing the patient right now in terms of line infection/bacteremia -challenge of hemodialysis with her current agitation and progressive dementia. I shared with her that I believe her mother is nearing end of life and that we are at a critical point of determining what that may look like- I discussed concepts of comfort and dignity if we cannot reverse her current medical issues or even maintain them with an acceptable functional status and QOL. [name] had an appropriate emotional response after our discussion and has been grieving her mother and her decline. She is emphatic that she does not want her mother to suffer- we discussed DNR and that order has been placed on the chart. I also discussed comfort medications and PRNs.   I left our conversation with our next step being to get a definitive position on hemodialysis and the line infection/HD access issue-I will contact nephrology to day and will update the patients daughter. If the patient is unable to continue HD then we will need to discuss comfort care options and help patient and family with this transition.   Recommendations: 1. DNR order placed 2. Will add on agitation meds and pain medication 3. Careful hand feeding 4. Delirium precautions 5. Continue current interventions as tolerated.  Lane Hacker, DO Palliative Medicine  Time: 50 min Greater than 50%  of this time was spent counseling and coordinating care related to the above assessment and plan.

## 2018-10-07 NOTE — Progress Notes (Signed)
Patient back from dialysis, sleeping at this time; no acute distress noticed; VS stable. Will continue to monitor.

## 2018-10-07 NOTE — Progress Notes (Signed)
  Speech Language Pathology Treatment: Dysphagia  Patient Details Name: [name] MRN: [id number] DOB: December 01, 1940 Today's Date: 10/07/2018 Time: 2919-1660 SLP Time Calculation (min) (ACUTE ONLY): 15 min  Assessment / Plan / Recommendation Clinical Impression  Pt seen for follow up after BSE completed 10/03/18. Notes indicate pt has been refusing po intake. Today, pt was crying out "help me", and was inconsolable despite SLPs best efforts. Trial of [place] ice was unsuccessful, with pt seemingly unaware of presentation. Palliative Care is assisting pt's daughter with decisions regarding code status with possible transition to comfort care. ST will follow for education as needed.   HPI HPI: [name] is a 78 y.o. female with medical history significant for end-stage renal disease on hemodialysis Monday Wednesday Friday, multiple myeloma, dementia, hypertension, chronic diastolic CHF, chronic hyponatremia, ambulatory dysfunction wheelchair-bound who presented to [facility] ED from SNF due to sudden change in behavior last night.  Patient is unable to provide a history due to altered mental status.  History is obtained from her nursing facility and from medical records.  Per RN at SNF around 11 PM last night patient became suddenly confused, pulling out her IVs being uncooperative and aggressive.  No recent changes in her medications.  Blood pressure was excessively elevated.  Patient was brought to the ED for further evaluation.  Per RN patient has had intermittent change in behavior in the last few months but just not as severe.  Patient has been refusing hemodialysis.  Last hemodialysis was last Wednesday.  Recently discharged on IV Zosyn for Achromobacter bacteremia.  She was found to have acute metabolic encephalopathy secondary to hypertensive urgency vs MSSA bacteremia.  She was also found to be hyponatremic.  CT head is negative for acute findings.        SLP Plan  Continue with current plan of  care       Recommendations  Diet recommendations: Dysphagia 1 (puree);Thin liquid Liquids provided via: Cup;Straw;Teaspoon Medication Administration: (at pt discretion) Supervision: Full supervision/cueing for compensatory strategies Compensations: Minimize environmental distractions;Slow rate;Small sips/bites Postural Changes and/or Swallow Maneuvers: Seated upright 90 degrees;Upright 30-60 min after meal                Oral Care Recommendations: Oral care BID Follow up Recommendations: Skilled Nursing facility SLP Visit Diagnosis: Dysphagia, oral phase (R13.11) Plan: Continue with current plan of care       [facility], CCC-SLP Speech Language Pathologist [phone]  Shonna Chock 10/07/2018, 11:14 AM

## 2018-10-07 NOTE — Progress Notes (Addendum)
Patient restless and disoriented.  Patient refusing/unable to take PO pills and eat breakfast. RN and Nurse Tech both tried to reorient and reassure patient to assist and encourage patient to eat breakfast but these attempts were unsuccessful. RN attempted to place patient back on telemetry but patient would not keep telemetry leads in place.

## 2018-10-07 NOTE — Progress Notes (Signed)
[facility]  Date of Admission:  10/03/2018     Total days of antibiotics 5         ASSESSMENT/PLAN  [name] has MSSA bacteremia and re-current Achromobacter bacteremia with her dialysis catheter being the most likely source of infection. Clinically she has remained without fever or leukocytosis, however her confusion and agitation have been more labile. This is most likely to her dementia as opposed to infection. Repeat cultures have remained without growth. Palliative care met with family and determining goals of care with emphasis on comfort. Will continue current interventions at present with duration of therapy dependent upon direction of goals.  1. Continue ceftazidime and cefazolin.  2. Monitor cultures 3. Appreciate Palliative Care assistance.    Principal Problem:   Bacteremia Active Problems:   Acute metabolic encephalopathy   . sodium chloride   Intravenous Once  . amLODipine  10 mg Oral QPM  . calcium acetate  1,334 mg Oral TID WC  . Chlorhexidine Gluconate Cloth  6 each Topical Q0600  . Chlorhexidine Gluconate Cloth  6 each Topical Q0600  . cloNIDine  0.2 mg Transdermal Weekly  . doxercalciferol  2 mcg Intravenous Q T,Th,Sa-HD  . feeding supplement (PRO-STAT SUGAR FREE 64)  30 mL Oral BID  . heparin injection (subcutaneous)  5,000 Units Subcutaneous Q8H  . isosorbide mononitrate  60 mg Oral Daily  . lamoTRIgine  50 mg Oral Daily  . latanoprost  1 drop Both Eyes QHS  . LORazepam  0.5 mg Oral QHS  . losartan  50 mg Oral QPM  . mouth rinse  15 mL Mouth Rinse BID  . multivitamin  1 tablet Oral Daily  . valACYclovir  500 mg Oral QODAY    SUBJECTIVE:  Afebrile overnight with no leukocytosis. Continued to have agitation and confusion overnight requiring medication. Dr. Hilma Favors has met with patient's daughter and continues to work on goals of care. Now DNR with continued interventions. Determination on HD to be made.   Allergies    Allergen Reactions  . Phenergan [Promethazine Hcl] Other (See Comments)    Acute encephalopathy in the context of refusal to go to hemodialysis and administration of Phenergan for nausea and vomiting     Review of Systems: Review of Systems  Unable to perform ROS: Dementia    OBJECTIVE: Vitals:   10/06/18 2024 10/06/18 2245 10/07/18 0624 10/07/18 1056  BP: (!) 109/95  (!) 138/112 (!) 210/182  Pulse: 76  (!) 109 67  Resp: 17     Temp: 98.3 F (36.8 C)     TempSrc: Oral     SpO2: 99%   100%  Weight:      Height:  _0  (1.676 m)     Body mass index is 19.54 kg/m.  Physical Exam Constitutional:      General: She is not in acute distress.    Appearance: She is well-developed.     Comments: Lying in bed with head of bed elevated; resting  Cardiovascular:     Rate and Rhythm: Normal rate and regular rhythm.     Heart sounds: Normal heart sounds.     Comments: Hemodialysis catheter dressing is clean and dry without evidence of infection.  Pulmonary:     Effort: Pulmonary effort is normal.     Breath sounds: Normal breath sounds.  Skin:    General: Skin is warm and dry.  Neurological:     Mental Status: She is alert. She is  confused.     Lab Results Lab Results  Component Value Date   WBC 5.3 10/07/2018   HGB 8.3 (L) 10/07/2018   HCT 27.6 (L) 10/07/2018   MCV 105.7 (H) 10/07/2018   PLT 232 10/07/2018    Lab Results  Component Value Date   CREATININE 7.39 (H) 10/07/2018   BUN 55 (H) 10/07/2018   NA 129 (L) 10/07/2018   K 4.0 10/07/2018   CL 95 (L) 10/07/2018   CO2 21 (L) 10/07/2018    Lab Results  Component Value Date   ALT 8 10/03/2018   AST 18 10/03/2018   ALKPHOS 49 10/03/2018   BILITOT 0.2 (L) 10/03/2018     Microbiology: Recent Results (from the past 240 hour(s))  Blood culture (routine x 2)     Status: Abnormal   Collection Time: 10/03/18  7:30 AM  Result Value Ref Range Status   Specimen Description BLOOD RIGHT HAND  Final   Special  Requests   Final    BOTTLES DRAWN AEROBIC AND ANAEROBIC Blood Culture adequate volume Performed at [facility], [address], [place], [place] [address]    Culture  Setup Time   Final    GRAM NEGATIVE RODS AEROBIC BOTTLE ONLY CRITICAL RESULT CALLED TO, READ BACK BY AND VERIFIED WITH: PHARMD BEN MANCHERIL [phone] [id number] FCP    Culture ACHROMOBACTER XYLOSOXIDANS (A)  Final   Report Status 10/06/2018 FINAL  Final   Organism ID, Bacteria ACHROMOBACTER XYLOSOXIDANS  Final      Susceptibility   Achromobacter xylosoxidans - MIC*    CEFEPIME 16 INTERMEDIATE Intermediate     CEFAZOLIN >=64 RESISTANT Resistant     GENTAMICIN >=16 RESISTANT Resistant     CIPROFLOXACIN >=4 RESISTANT Resistant     IMIPENEM 8 INTERMEDIATE Intermediate     TRIMETH/SULFA <=20 SENSITIVE Sensitive     CEFTAZIDIME Value in next row Sensitive      SENSITIVE4    PIP/TAZO Value in next row Sensitive      SENSITIVE<=4    * ACHROMOBACTER XYLOSOXIDANS  Blood Culture ID Panel (Reflexed)     Status: None   Collection Time: 10/03/18  7:31 AM  Result Value Ref Range Status   Enterococcus species NOT DETECTED NOT DETECTED Final   Listeria monocytogenes NOT DETECTED NOT DETECTED Final   Staphylococcus species NOT DETECTED NOT DETECTED Final   Staphylococcus aureus (BCID) NOT DETECTED NOT DETECTED Final   Streptococcus species NOT DETECTED NOT DETECTED Final   Streptococcus agalactiae NOT DETECTED NOT DETECTED Final   Streptococcus pneumoniae NOT DETECTED NOT DETECTED Final   Streptococcus pyogenes NOT DETECTED NOT DETECTED Final   Acinetobacter baumannii NOT DETECTED NOT DETECTED Final   Enterobacteriaceae species NOT DETECTED NOT DETECTED Final   Enterobacter cloacae complex NOT DETECTED NOT DETECTED Final   Escherichia coli NOT DETECTED NOT DETECTED Final   Klebsiella oxytoca NOT DETECTED NOT DETECTED Final   Klebsiella pneumoniae NOT DETECTED NOT DETECTED Final   Proteus species NOT DETECTED NOT DETECTED Final    Serratia marcescens NOT DETECTED NOT DETECTED Final   Haemophilus influenzae NOT DETECTED NOT DETECTED Final   Neisseria meningitidis NOT DETECTED NOT DETECTED Final   Pseudomonas aeruginosa NOT DETECTED NOT DETECTED Final   Candida albicans NOT DETECTED NOT DETECTED Final   Candida glabrata NOT DETECTED NOT DETECTED Final   Candida krusei NOT DETECTED NOT DETECTED Final   Candida parapsilosis NOT DETECTED NOT DETECTED Final   Candida tropicalis NOT DETECTED NOT DETECTED Final  Blood culture (  routine x 2)     Status: Abnormal   Collection Time: 10/03/18  7:33 AM  Result Value Ref Range Status   Specimen Description BLOOD RIGHT ANTECUBITAL  Final   Special Requests   Final    BOTTLES DRAWN AEROBIC AND ANAEROBIC Blood Culture results may not be optimal due to an inadequate volume of blood received in culture bottles   Culture  Setup Time   Final    GRAM POSITIVE COCCI AEROBIC BOTTLE ONLY Organism ID to follow CRITICAL RESULT CALLED TO, READ BACK BY AND VERIFIED WITHKarsten Ro PHARMD 1025 10/04/18 A BROWNING Performed at [facility], [place] [address], [place], [place] [address]    Culture STAPHYLOCOCCUS AUREUS (A)  Final   Report Status 10/06/2018 FINAL  Final   Organism ID, Bacteria STAPHYLOCOCCUS AUREUS  Final      Susceptibility   Staphylococcus aureus - MIC*    CIPROFLOXACIN <=0.5 SENSITIVE Sensitive     ERYTHROMYCIN <=0.25 SENSITIVE Sensitive     GENTAMICIN <=0.5 SENSITIVE Sensitive     OXACILLIN 0.5 SENSITIVE Sensitive     TETRACYCLINE <=1 SENSITIVE Sensitive     VANCOMYCIN <=0.5 SENSITIVE Sensitive     TRIMETH/SULFA <=10 SENSITIVE Sensitive     CLINDAMYCIN <=0.25 SENSITIVE Sensitive     RIFAMPIN <=0.5 SENSITIVE Sensitive     Inducible Clindamycin NEGATIVE Sensitive     * STAPHYLOCOCCUS AUREUS  Blood Culture ID Panel (Reflexed)     Status: Abnormal   Collection Time: 10/03/18  7:33 AM  Result Value Ref Range Status   Enterococcus species NOT DETECTED NOT DETECTED Final    Listeria monocytogenes NOT DETECTED NOT DETECTED Final   Staphylococcus species DETECTED (A) NOT DETECTED Final    Comment: CRITICAL RESULT CALLED TO, READ BACK BY AND VERIFIED WITH: J [facility] PHARMD 0240 10/04/18 A BROWNING    Staphylococcus aureus (BCID) DETECTED (A) NOT DETECTED Final    Comment: Methicillin (oxacillin) susceptible Staphylococcus aureus (MSSA). Preferred therapy is anti staphylococcal beta lactam antibiotic (Cefazolin or Nafcillin), unless clinically contraindicated. CRITICAL RESULT CALLED TO, READ BACK BY AND VERIFIED WITH: Karsten Ro PHARMD 0240 10/04/18 A BROWNING    Methicillin resistance NOT DETECTED NOT DETECTED Final   Streptococcus species NOT DETECTED NOT DETECTED Final   Streptococcus agalactiae NOT DETECTED NOT DETECTED Final   Streptococcus pneumoniae NOT DETECTED NOT DETECTED Final   Streptococcus pyogenes NOT DETECTED NOT DETECTED Final   Acinetobacter baumannii NOT DETECTED NOT DETECTED Final   Enterobacteriaceae species NOT DETECTED NOT DETECTED Final   Enterobacter cloacae complex NOT DETECTED NOT DETECTED Final   Escherichia coli NOT DETECTED NOT DETECTED Final   Klebsiella oxytoca NOT DETECTED NOT DETECTED Final   Klebsiella pneumoniae NOT DETECTED NOT DETECTED Final   Proteus species NOT DETECTED NOT DETECTED Final   Serratia marcescens NOT DETECTED NOT DETECTED Final   Haemophilus influenzae NOT DETECTED NOT DETECTED Final   Neisseria meningitidis NOT DETECTED NOT DETECTED Final   Pseudomonas aeruginosa NOT DETECTED NOT DETECTED Final   Candida albicans NOT DETECTED NOT DETECTED Final   Candida glabrata NOT DETECTED NOT DETECTED Final   Candida krusei NOT DETECTED NOT DETECTED Final   Candida parapsilosis NOT DETECTED NOT DETECTED Final   Candida tropicalis NOT DETECTED NOT DETECTED Final    Comment: Performed at [facility], [place] [address] [place], [place] [address]  MRSA PCR Screening     Status: None   Collection Time: 10/03/18  6:46  PM  Result Value Ref Range  Status   MRSA by PCR NEGATIVE NEGATIVE Final    Comment:        The GeneXpert MRSA Assay (FDA approved for NASAL specimens only), is one component of a comprehensive MRSA colonization surveillance program. It is not intended to diagnose MRSA infection nor to guide or monitor treatment for MRSA infections. Performed at [facility], [place] [address], [place], [place] [address]   Culture, blood (Routine X 2) w Reflex to ID Panel     Status: None (Preliminary result)   Collection Time: 10/04/18  1:18 PM  Result Value Ref Range Status   Specimen Description BLOOD BLOOD LEFT HAND  Final   Special Requests AEROBIC BOTTLE ONLY Blood Culture adequate volume  Final   Culture   Final    NO GROWTH 3 DAYS Performed at [facility], [place] [address], [place], [place] [address]    Report Status PENDING  Incomplete  Culture, blood (routine x 2)     Status: None (Preliminary result)   Collection Time: 10/06/18 11:13 AM  Result Value Ref Range Status   Specimen Description BLOOD RIGHT FOREARM  Final   Special Requests   Final    BOTTLES DRAWN AEROBIC AND ANAEROBIC Blood Culture adequate volume   Culture   Final    NO GROWTH < 24 HOURS Performed at [facility], [place] [address], [place], [place] [address]    Report Status PENDING  Incomplete  Culture, blood (routine x 2)     Status: None (Preliminary result)   Collection Time: 10/06/18 11:18 AM  Result Value Ref Range Status   Specimen Description BLOOD RIGHT HAND  Final   Special Requests   Final    BOTTLES DRAWN AEROBIC AND ANAEROBIC Blood Culture adequate volume   Culture   Final    NO GROWTH < 24 HOURS Performed at [facility], [place] [address] [place],  [address]    Report Status PENDING  Incomplete     Terri Piedra, [facility] [place] Pager  10/07/2018  12:24 PM

## 2018-10-07 NOTE — Progress Notes (Addendum)
[facility] Progress Note   Subjective:   Seen and examined at bedside.  Patient is confused, with rambling, nonsensical speech.  Per nursing staff she has been this way all morning.  BP elevated today.    Objective Vitals:   10/06/18 2024 10/06/18 2245 10/07/18 0624 10/07/18 1056  BP: (!) 109/95  (!) 138/112 (!) 210/182  Pulse: 76  (!) 109 67  Resp: 17     Temp: 98.3 F (36.8 C)     TempSrc: Oral     SpO2: 99%   100%  Weight:      Height:  '5\' 6"'  (1.676 m)     Physical Exam General:NAD, chronically ill appearing, confused, elderly female Heart:RRR Lungs:CTAB, breathing unlabored Extremities:no LE edema Dialysis Access: R [facility]  Filed Weights   10/03/18 1621 10/04/18 0745 10/04/18 1115  Weight: 53.9 kg 57 kg 54.9 kg    Intake/Output Summary (Last 24 hours) at 10/07/2018 1143 Last data filed at 10/07/2018 0900 Gross per 24 hour  Intake 330 ml  Output -  Net 330 ml    Additional Objective Labs: Basic Metabolic Panel: Recent Labs  Lab 10/05/18 0300 10/06/18 0349 10/07/18 0540  NA 131* 129* 129*  K 3.7 3.8 4.0  CL 97* 96* 95*  CO2 28 25 21*  GLUCOSE 71 81 95  BUN 19 35* 55*  CREATININE 5.01* 5.95* 7.39*  CALCIUM 8.4* 8.7* 9.5   Liver Function Tests: Recent Labs  Lab 10/03/18 0325  AST 18  ALT 8  ALKPHOS 49  BILITOT 0.2*  PROT 9.3*  ALBUMIN 2.2*   CBC: Recent Labs  Lab 10/03/18 0325 10/04/18 0519 10/05/18 0300 10/05/18 1359 10/06/18 0349 10/07/18 0540  WBC 4.3 4.7 3.9*  --  4.0 5.3  NEUTROABS 3.5 3.8 2.7  --  2.6 4.0  HGB 10.8* 8.0* 7.5*  --  7.0* 8.3*  HCT 36.9 26.9* 25.3* 21.9* 23.6* 27.6*  MCV 103.4* 106.3* 104.5*  --  105.8* 105.7*  PLT 149* 193 164  --  179 232   Blood Culture    Component Value Date/Time   SDES BLOOD RIGHT HAND 10/06/2018 1118   SPECREQUEST  10/06/2018 1118    BOTTLES DRAWN AEROBIC AND ANAEROBIC Blood Culture adequate volume   CULT  10/06/2018 1118    NO GROWTH < 24 HOURS Performed at [facility], [place] [address], [place], [place] [address]    REPTSTATUS PENDING 10/06/2018 1118   CBG: Recent Labs  Lab 10/03/18 0347  GLUCAP 83   Iron Studies:  Recent Labs    10/05/18 1359  IRON 60  TIBC NOT CALCULATED  FERRITIN 1,623*   Studies/Results: No results found.  Medications: .  ceFAZolin (ANCEF) IV    . cefTAZidime (FORTAZ)  IV     . sodium chloride   Intravenous Once  . amLODipine  10 mg Oral QPM  . calcium acetate  1,334 mg Oral TID WC  . Chlorhexidine Gluconate Cloth  6 each Topical Q0600  . Chlorhexidine Gluconate Cloth  6 each Topical Q0600  . cloNIDine  0.2 mg Transdermal Weekly  . doxercalciferol  2 mcg Intravenous Q T,Th,Sa-HD  . feeding supplement (PRO-STAT SUGAR FREE 64)  30 mL Oral BID  . heparin injection (subcutaneous)  5,000 Units Subcutaneous Q8H  . isosorbide mononitrate  60 mg Oral Daily  . lamoTRIgine  50 mg Oral Daily  . latanoprost  1 drop Both Eyes QHS  . LORazepam  0.5 mg Oral QHS  .  losartan  50 mg Oral QPM  . mouth rinse  15 mL Mouth Rinse BID  . multivitamin  1 tablet Oral Daily  . valACYclovir  500 mg Oral QODAY    Dialysis Orders: AF TTS 3.5h 425/1.5x EDW 54kg 2K/2.25Ca  TDC No heparin bolus Hectorol 2 mcg IV TIW Retacrit 20000 U IV TIW  Assessment/Plan: 1. Acute encephalopathy - HTN enceph? Recurrent admits w/similar presentation.  Head CT w/no acute findings. Confused again today, BP^.   2. MSSA and GNR Bacteremia - changed to ceftazidime & cefazolin. Rechecking BC - NGTD. ID following. No endocarditis work up d/t comorbidities. Pt has catheter sepsis, putting her through another invasive procedure is not likely to improve her QOL and may just be prolonging things. Transition to comfort care would be supported w/ her signicant decline and comorbidities.  3. ESRD - On HD TTS.  K4.0. HD today per regular schedule.  4. Anemia of CKD- Hgb improved today 8.0>7.5>7.0>8.3. No iron due to high ferritin. Patient is Jehov[employer] Witness and  refused blood transfusion. Follow trends. 5. Secondary hyperparathyroidism - Ca at goal.  Checking phos. Continue VDRA/binders.  6. HTN/volume - BP elevated today.  Continue meds. Does not appear volume overloaded on exam.  Close to EDW post HD.   7. Nutrition - Renal diet w/fluid restrictions. Vitamins. Prostat.  8. Multiple myeloma - followed by [facility] onc 9. FTT - seen by palliative care.  Poor prognosis.   Jen Mow, PA-C [facility] Pager: [phone] 10/07/2018,11:43 AM  LOS: 4 days   Pt seen, examined and agree w A/P as above.  Kelly Splinter MD [employer] pager [phone]   10/07/2018, 1:05 PM

## 2018-10-07 NOTE — Progress Notes (Signed)
PROGRESS NOTE  [name] ZOX:[id number] DOB: [birth year] DOA: 10/03/2018 PCP: Hendricks Limes, MD  HPI/brief narrative:  78 y.o. female with medical history significant for end-stage renal disease on hemodialysis Tuesday Thursday Saturday, multiple myeloma, dementia, hypertension, chronic diastolic CHF, chronic hyponatremia, ambulatory dysfunction wheelchair-bound who presented to [facility] ED from SNF due altered mental status, confusion , blood pressure uncontrolled on admission, it was felt secondary to be hypertensive encephalopathy, at this resembles previous presentation, as well her work-up was significant for bacteremia, which she did have as well in the past, she was seen by infectious disease, given his recurrent, it was felt secondary to her dialysis access, mental status continues to deteriorate, medicine has been consulted to discuss goals of care with the family .  Subjective  Significantly confused and agitated, cannot provide any complaints, per staff no significant events beside her agitation and delirium.   Assessment/Plan: Principal Problem:   Bacteremia Active Problems:   Acute metabolic encephalopathy  Acute metabolic encephalopathy  -With some baseline mild dementia,by  viewing previous records, she is awake, confused, but usually oriented x2, she is currently more confused during this hospital stay on admission,  -To be multifactorial, secondary to hypertensive nephropathy, and infectious process  -Worsened currently by hospital delirium  -CT head 10/04/2018 with no acute findings    MSSA/Achromobacter bacteremia -Patient with recurrent MSSA/Achromobacter bacteremia, -ID input greatly appreciated, this was felt secondary to hemodialysis catheter. -Recommendation is to exchange her hemodialysis catheter, - Afebrile with no leukocytosis  Anemia of chronic  Kidney disease -Hemoglobin was 8 on admission, trending down, she is ordered 2 units for transfusion during  dialysis today . globin trending down, -No IV iron as ferritin is high - B12 and folate levels normal  Dysphagia Speech therapy recommended dysphagia 1 with thin liquids Aspiration precautions  Chronic hyponatremia -Management with dialysis  Resolved hypertensive emergency Presented with diastolic blood pressures in the 150's and altered mental status Resume home antihypertensive medications: Cozaar 50 mg nightly, Imdur 60 mg daily, clonidine patch 0.2 mg every 7 days, amlodipine 10 mg daily IV hydralazine 10 mg every 6 hours as needed for systolic blood pressure greater than 811 or diastolic greater than 914 - blood Pressure is extremely labile, yesterday evening 109/95, this morning is 210/183, is most likely in the setting of agitation as well, it should improve after dialysis,  Multiple myeloma Patient undergoes radiation once a week Follows with oncology outpatient at [facility]  Intermittent sinus bradycardia Obtain twelve-lead EKG On medications that can affect her heart rate which includes amlodipine and clonidine Continue to monitor vital signs closely on telemetry  End-stage renal disease on hemodialysis Tuesday Thursday Saturday Patient has refused hemodialysis multiple times in the past Hemodialysis per nephrology  Recent history of bacteremia Blood cultures drawn on 08/25/2018 showed multi- drug-resistant Acromobacter See above discussion  Chronic diastolic CHF -Volume management with dialysis - Last 2D echo done on 08/27/2018 revealed preserved LVEF and grade 1 diastolic dysfunction   Moderate pulmonary hypertension Pulmonary arterial pressure 44 mmHg No evidence of vegetation Follows with cardiology outpatient  Physical debility/ambulatory dysfunction Wheelchair-bound per RN at [facility] facility Fall precautions PT assessed and recommended SNF with 24-hour supervision/assistance CSW consulted to assist with placement.  Highly appreciated.  Goals of  care: -Since with overall very poor prognosis, recurrent admissions, multiple comorbidities and poor life quality, medicine has been consulted to discuss goals of care with family.   DVT prophylaxis: Subcu heparin   Code Status: DNR  Family  Communication:  None at bedside  Disposition Plan:  Back to SNF when hemodynamically stable.  Consults called: Nephrology , palliative medicine,  infectious disease      Objective: Vitals:   10/06/18 2024 10/06/18 2245 10/07/18 0624 10/07/18 1056  BP: (!) 109/95  (!) 138/112   Pulse: 76  (!) 109 67  Resp: 17     Temp: 98.3 F (36.8 C)     TempSrc: Oral     SpO2: 99%   100%  Weight:      Height:  '5\' 6"'  (1.676 m)      Intake/Output Summary (Last 24 hours) at 10/07/2018 1057 Last data filed at 10/07/2018 0900 Gross per 24 hour  Intake 510 ml  Output -  Net 510 ml   Filed Weights   10/03/18 1621 10/04/18 0745 10/04/18 1115  Weight: 53.9 kg 57 kg 54.9 kg    Exam:  In bed, restless, combative, does not follow command, fused, does not follow commands or answer questions, yelling Symmetrical Chest wall movement, Good air movement bilaterally, CTAB RRR,No Gallops,Rubs or new Murmurs, No Parasternal Heave +ve B.Sounds, Abd Soft, No tenderness, No rebound - guarding or rigidity. No Cyanosis, Clubbing or edema, No new Rash or bruise    Data Reviewed: CBC: Recent Labs  Lab 10/03/18 0325 10/04/18 0519 10/05/18 0300 10/05/18 1359 10/06/18 0349 10/07/18 0540  WBC 4.3 4.7 3.9*  --  4.0 5.3  NEUTROABS 3.5 3.8 2.7  --  2.6 4.0  HGB 10.8* 8.0* 7.5*  --  7.0* 8.3*  HCT 36.9 26.9* 25.3* 21.9* 23.6* 27.6*  MCV 103.4* 106.3* 104.5*  --  105.8* 105.7*  PLT 149* 193 164  --  179 768   Basic Metabolic Panel: Recent Labs  Lab 10/03/18 0325 10/04/18 0519 10/05/18 0300 10/06/18 0349 10/07/18 0540  NA 131* 131* 131* 129* 129*  K 4.4 4.3 3.7 3.8 4.0  CL 95* 97* 97* 96* 95*  CO2 '29 22 28 25 ' 21*  GLUCOSE 85 76 71 81 95  BUN 30*  39* 19 35* 55*  CREATININE 7.27* 8.38* 5.01* 5.95* 7.39*  CALCIUM 9.4 8.8* 8.4* 8.7* 9.5   GFR: Estimated Creatinine Clearance: 5.3 mL/min (A) (by C-G formula based on SCr of 7.39 mg/dL (H)). Liver Function Tests: Recent Labs  Lab 10/03/18 0325  AST 18  ALT 8  ALKPHOS 49  BILITOT 0.2*  PROT 9.3*  ALBUMIN 2.2*   No results for input(s): LIPASE, AMYLASE in the last 168 hours. Recent Labs  Lab 10/03/18 0326  AMMONIA 19   Coagulation Profile: No results for input(s): INR, PROTIME in the last 168 hours. Cardiac Enzymes: No results for input(s): CKTOTAL, CKMB, CKMBINDEX, TROPONINI in the last 168 hours. BNP (last 3 results) No results for input(s): PROBNP in the last 8760 hours. HbA1C: No results for input(s): HGBA1C in the last 72 hours. CBG: Recent Labs  Lab 10/03/18 0347  GLUCAP 83   Lipid Profile: No results for input(s): CHOL, HDL, LDLCALC, TRIG, CHOLHDL, LDLDIRECT in the last 72 hours. Thyroid Function Tests: No results for input(s): TSH, T4TOTAL, FREET4, T3FREE, THYROIDAB in the last 72 hours. Anemia Panel: Recent Labs    10/05/18 1359  VITAMINB12 600  FERRITIN 1,623*  TIBC NOT CALCULATED  IRON 60  RETICCTPCT 2.0   Urine analysis:    Component Value Date/Time   COLORURINE STRAW (A) 09/16/2018 0326   APPEARANCEUR CLEAR 09/16/2018 0326   LABSPEC 1.010 09/16/2018 0326   PHURINE 9.0 (H) 09/16/2018 0881  GLUCOSEU 50 (A) 09/16/2018 0326   HGBUR NEGATIVE 09/16/2018 0326   BILIRUBINUR NEGATIVE 09/16/2018 0326   KETONESUR 5 (A) 09/16/2018 0326   PROTEINUR 100 (A) 09/16/2018 0326   UROBILINOGEN 0.2 04/10/2015 1800   NITRITE NEGATIVE 09/16/2018 0326   LEUKOCYTESUR NEGATIVE 09/16/2018 0326   Sepsis Labs: '@LABRCNTIP' (procalcitonin:4,lacticidven:4)  ) Recent Results (from the past 240 hour(s))  Blood culture (routine x 2)     Status: Abnormal   Collection Time: 10/03/18  7:30 AM  Result Value Ref Range Status   Specimen Description BLOOD RIGHT HAND  Final    Special Requests   Final    BOTTLES DRAWN AEROBIC AND ANAEROBIC Blood Culture adequate volume Performed at [facility], [place] [address], [place], [place] [address]    Culture  Setup Time   Final    GRAM NEGATIVE RODS AEROBIC BOTTLE ONLY CRITICAL RESULT CALLED TO, READ BACK BY AND VERIFIED WITH: PHARMD BEN MANCHERIL [phone] [id number] FCP    Culture ACHROMOBACTER XYLOSOXIDANS (A)  Final   Report Status 10/06/2018 FINAL  Final   Organism ID, Bacteria ACHROMOBACTER XYLOSOXIDANS  Final      Susceptibility   Achromobacter xylosoxidans - MIC*    CEFEPIME 16 INTERMEDIATE Intermediate     CEFAZOLIN >=64 RESISTANT Resistant     GENTAMICIN >=16 RESISTANT Resistant     CIPROFLOXACIN >=4 RESISTANT Resistant     IMIPENEM 8 INTERMEDIATE Intermediate     TRIMETH/SULFA <=20 SENSITIVE Sensitive     CEFTAZIDIME Value in next row Sensitive      SENSITIVE4    PIP/TAZO Value in next row Sensitive      SENSITIVE<=4    * ACHROMOBACTER XYLOSOXIDANS  Blood Culture ID Panel (Reflexed)     Status: None   Collection Time: 10/03/18  7:31 AM  Result Value Ref Range Status   Enterococcus species NOT DETECTED NOT DETECTED Final   Listeria monocytogenes NOT DETECTED NOT DETECTED Final   Staphylococcus species NOT DETECTED NOT DETECTED Final   Staphylococcus aureus (BCID) NOT DETECTED NOT DETECTED Final   Streptococcus species NOT DETECTED NOT DETECTED Final   Streptococcus agalactiae NOT DETECTED NOT DETECTED Final   Streptococcus pneumoniae NOT DETECTED NOT DETECTED Final   Streptococcus pyogenes NOT DETECTED NOT DETECTED Final   Acinetobacter baumannii NOT DETECTED NOT DETECTED Final   Enterobacteriaceae species NOT DETECTED NOT DETECTED Final   Enterobacter cloacae complex NOT DETECTED NOT DETECTED Final   Escherichia coli NOT DETECTED NOT DETECTED Final   Klebsiella oxytoca NOT DETECTED NOT DETECTED Final   Klebsiella pneumoniae NOT DETECTED NOT DETECTED Final   Proteus species NOT DETECTED NOT  DETECTED Final   Serratia marcescens NOT DETECTED NOT DETECTED Final   Haemophilus influenzae NOT DETECTED NOT DETECTED Final   Neisseria meningitidis NOT DETECTED NOT DETECTED Final   Pseudomonas aeruginosa NOT DETECTED NOT DETECTED Final   Candida albicans NOT DETECTED NOT DETECTED Final   Candida glabrata NOT DETECTED NOT DETECTED Final   Candida krusei NOT DETECTED NOT DETECTED Final   Candida parapsilosis NOT DETECTED NOT DETECTED Final   Candida tropicalis NOT DETECTED NOT DETECTED Final  Blood culture (routine x 2)     Status: Abnormal   Collection Time: 10/03/18  7:33 AM  Result Value Ref Range Status   Specimen Description BLOOD RIGHT ANTECUBITAL  Final   Special Requests   Final    BOTTLES DRAWN AEROBIC AND ANAEROBIC Blood Culture results may not be optimal due to an inadequate volume of blood received in culture  bottles   Culture  Setup Time   Final    GRAM POSITIVE COCCI AEROBIC BOTTLE ONLY Organism ID to follow CRITICAL RESULT CALLED TO, READ BACK BY AND VERIFIED WITHKarsten Ro [facility] [id number] 10/04/18 A BROWNING Performed at [facility], [place] [address] [place] [address], [place], [place] [address]    Culture STAPHYLOCOCCUS AUREUS (A)  Final   Report Status 10/06/2018 FINAL  Final   Organism ID, Bacteria STAPHYLOCOCCUS AUREUS  Final      Susceptibility   Staphylococcus aureus - MIC*    CIPROFLOXACIN <=0.5 SENSITIVE Sensitive     ERYTHROMYCIN <=0.25 SENSITIVE Sensitive     GENTAMICIN <=0.5 SENSITIVE Sensitive     OXACILLIN 0.5 SENSITIVE Sensitive     TETRACYCLINE <=1 SENSITIVE Sensitive     VANCOMYCIN <=0.5 SENSITIVE Sensitive     TRIMETH/SULFA <=10 SENSITIVE Sensitive     CLINDAMYCIN <=0.25 SENSITIVE Sensitive     RIFAMPIN <=0.5 SENSITIVE Sensitive     Inducible Clindamycin NEGATIVE Sensitive     * STAPHYLOCOCCUS AUREUS  Blood Culture ID Panel (Reflexed)     Status: Abnormal   Collection Time: 10/03/18  7:33 AM  Result Value Ref Range Status   Enterococcus species NOT DETECTED  NOT DETECTED Final   Listeria monocytogenes NOT DETECTED NOT DETECTED Final   Staphylococcus species DETECTED (A) NOT DETECTED Final    Comment: CRITICAL RESULT CALLED TO, READ BACK BY AND VERIFIED WITH: J [facility] PHARMD [id number] 10/04/18 A BROWNING    Staphylococcus aureus (BCID) DETECTED (A) NOT DETECTED Final    Comment: Methicillin (oxacillin) susceptible Staphylococcus aureus (MSSA). Preferred therapy is anti staphylococcal beta lactam antibiotic (Cefazolin or Nafcillin), unless clinically contraindicated. CRITICAL RESULT CALLED TO, READ BACK BY AND VERIFIED WITH: Karsten Ro PHARMD 0240 10/04/18 A BROWNING    Methicillin resistance NOT DETECTED NOT DETECTED Final   Streptococcus species NOT DETECTED NOT DETECTED Final   Streptococcus agalactiae NOT DETECTED NOT DETECTED Final   Streptococcus pneumoniae NOT DETECTED NOT DETECTED Final   Streptococcus pyogenes NOT DETECTED NOT DETECTED Final   Acinetobacter baumannii NOT DETECTED NOT DETECTED Final   Enterobacteriaceae species NOT DETECTED NOT DETECTED Final   Enterobacter cloacae complex NOT DETECTED NOT DETECTED Final   Escherichia coli NOT DETECTED NOT DETECTED Final   Klebsiella oxytoca NOT DETECTED NOT DETECTED Final   Klebsiella pneumoniae NOT DETECTED NOT DETECTED Final   Proteus species NOT DETECTED NOT DETECTED Final   Serratia marcescens NOT DETECTED NOT DETECTED Final   Haemophilus influenzae NOT DETECTED NOT DETECTED Final   Neisseria meningitidis NOT DETECTED NOT DETECTED Final   Pseudomonas aeruginosa NOT DETECTED NOT DETECTED Final   Candida albicans NOT DETECTED NOT DETECTED Final   Candida glabrata NOT DETECTED NOT DETECTED Final   Candida krusei NOT DETECTED NOT DETECTED Final   Candida parapsilosis NOT DETECTED NOT DETECTED Final   Candida tropicalis NOT DETECTED NOT DETECTED Final    Comment: Performed at [facility], [place] [address] [place], [place] [address]  MRSA PCR Screening     Status: None   Collection  Time: 10/03/18  6:46 PM  Result Value Ref Range Status   MRSA by PCR NEGATIVE NEGATIVE Final    Comment:        The GeneXpert MRSA Assay (FDA approved for NASAL specimens only), is one component of a comprehensive MRSA colonization surveillance program. It is not intended to diagnose MRSA infection nor to guide or monitor treatment for MRSA infections. Performed at [facility], [address] [place], [place] [address]   Culture, blood (Routine X 2) w Reflex to ID Panel     Status: None (Preliminary result)   Collection Time: 10/04/18  1:18 PM  Result Value Ref Range Status   Specimen Description BLOOD BLOOD LEFT HAND  Final   Special Requests AEROBIC BOTTLE ONLY Blood Culture adequate volume  Final   Culture   Final    NO GROWTH 3 DAYS Performed at [facility], [place] [address] [place], [place] [address]    Report Status PENDING  Incomplete  Culture, blood (routine x 2)     Status: None (Preliminary result)   Collection Time: 10/06/18 11:13 AM  Result Value Ref Range Status   Specimen Description BLOOD RIGHT FOREARM  Final   Special Requests   Final    BOTTLES DRAWN AEROBIC AND ANAEROBIC Blood Culture adequate volume   Culture   Final    NO GROWTH < 24 HOURS Performed at [facility], [place] [address] [place], [place] [address]    Report Status PENDING  Incomplete  Culture, blood (routine x 2)     Status: None (Preliminary result)   Collection Time: 10/06/18 11:18 AM  Result Value Ref Range Status   Specimen Description BLOOD RIGHT HAND  Final   Special Requests   Final    BOTTLES DRAWN AEROBIC AND ANAEROBIC Blood Culture adequate volume   Culture   Final    NO GROWTH < 24 HOURS Performed at [facility], [place] [address], [place], [place] [address]    Report Status PENDING  Incomplete      Studies: No results found.  Scheduled Meds: . sodium chloride   Intravenous Once  . amLODipine  10 mg Oral QPM  . calcium acetate  1,334 mg Oral TID  WC  . Chlorhexidine Gluconate Cloth  6 each Topical Q0600  . Chlorhexidine Gluconate Cloth  6 each Topical Q0600  . cloNIDine  0.2 mg Transdermal Weekly  . doxercalciferol  2 mcg Intravenous Q T,Th,Sa-HD  . feeding supplement (PRO-STAT SUGAR FREE 64)  30 mL Oral BID  . heparin injection (subcutaneous)  5,000 Units Subcutaneous Q8H  . isosorbide mononitrate  60 mg Oral Daily  . lamoTRIgine  50 mg Oral Daily  . latanoprost  1 drop Both Eyes QHS  . LORazepam  0.5 mg Oral QHS  . losartan  50 mg Oral QPM  . mouth rinse  15 mL Mouth Rinse BID  . multivitamin  1 tablet Oral Daily  . valACYclovir  500 mg Oral QODAY    Continuous Infusions: .  ceFAZolin (ANCEF) IV    . cefTAZidime (FORTAZ)  IV       LOS: 4 days     Phillips Climes, MD [facility] Pager [phone]  If 7PM-7AM, please contact night-coverage [email or web address] Password [facility] 10/07/2018, 10:57 AM

## 2018-10-08 DIAGNOSIS — I120 Hypertensive chronic kidney disease with stage 5 chronic kidney disease or end stage renal disease: Secondary | ICD-10-CM

## 2018-10-08 LAB — BASIC METABOLIC PANEL
Anion gap: 6 (ref 5–15)
BUN: 22 mg/dL (ref 8–23)
CO2: 27 mmol/L (ref 22–32)
Calcium: 8.7 mg/dL — ABNORMAL LOW (ref 8.9–10.3)
Chloride: 96 mmol/L — ABNORMAL LOW (ref 98–111)
Creatinine, Ser: 3.96 mg/dL — ABNORMAL HIGH (ref 0.44–1.00)
GFR calc Af Amer: 12 mL/min — ABNORMAL LOW (ref >60)
GFR calc non Af Amer: 10 mL/min — ABNORMAL LOW (ref >60)
Glucose, Bld: 76 mg/dL (ref 70–99)
Potassium: 3.7 mmol/L (ref 3.5–5.1)
Sodium: 129 mmol/L — ABNORMAL LOW (ref 135–145)

## 2018-10-08 LAB — CBC WITH DIFFERENTIAL/PLATELET
ABS IMMATURE GRANULOCYTES: 0.05 10*3/uL (ref 0.00–0.07)
Basophils Absolute: 0 10*3/uL (ref 0.0–0.1)
Basophils Relative: 1 %
Eosinophils Absolute: 0 10*3/uL (ref 0.0–0.5)
Eosinophils Relative: 1 %
HCT: 23.2 % — ABNORMAL LOW (ref 36.0–46.0)
Hemoglobin: 7 g/dL — ABNORMAL LOW (ref 12.0–15.0)
Immature Granulocytes: 1 %
Lymphocytes Relative: 14 %
Lymphs Abs: 0.5 10*3/uL — ABNORMAL LOW (ref 0.7–4.0)
MCH: 31.4 pg (ref 26.0–34.0)
MCHC: 30.2 g/dL (ref 30.0–36.0)
MCV: 104 fL — ABNORMAL HIGH (ref 80.0–100.0)
Monocytes Absolute: 0.4 10*3/uL (ref 0.1–1.0)
Monocytes Relative: 10 %
Neutro Abs: 2.6 10*3/uL (ref 1.7–7.7)
Neutrophils Relative %: 73 %
Platelets: 189 10*3/uL (ref 150–400)
RBC: 2.23 MIL/uL — ABNORMAL LOW (ref 3.87–5.11)
RDW: 18 % — ABNORMAL HIGH (ref 11.5–15.5)
WBC: 3.6 10*3/uL — ABNORMAL LOW (ref 4.0–10.5)
nRBC: 0 % (ref 0.0–0.2)

## 2018-10-08 MED ORDER — CHLORHEXIDINE GLUCONATE CLOTH 2 % EX PADS: 6.0000 | MEDICATED_PAD | Freq: Every day | CUTANEOUS | Status: DC

## 2018-10-08 NOTE — Progress Notes (Signed)
[facility]  Date of Admission:  10/03/2018     Total days of antibiotics 6         ASSESSMENT/PLAN  [name] is a 78 y/o female with polymicrobial bacteremia with MSSA and re-current Achromobacter with the most likely source being her dialysis cathter. Repeat cultures have remained without growth. She had a mildly increased temperature today. Awaiting palliative care discussion of continuity of dialysis to determine duration of treatment. Will continue current course at present.   1. Continue current cefazolin and ceftazidime.  2. Monitor cultures, fevers, and WBC count.  3. Await plan of care decisions.    Principal Problem:   Bacteremia Active Problems:   Hypertension associatd with end stage renal disease on dialysis   . sodium chloride   Intravenous Once  . amLODipine  10 mg Oral QPM  . calcium acetate  1,334 mg Oral TID WC  . Chlorhexidine Gluconate Cloth  6 each Topical Q0600  . Chlorhexidine Gluconate Cloth  6 each Topical Q0600  . cloNIDine  0.2 mg Transdermal Weekly  . doxercalciferol  2 mcg Intravenous Q T,Th,Sa-HD  . feeding supplement (PRO-STAT SUGAR FREE 64)  30 mL Oral BID  . heparin injection (subcutaneous)  5,000 Units Subcutaneous Q8H  . isosorbide mononitrate  60 mg Oral Daily  . lamoTRIgine  50 mg Oral Daily  . latanoprost  1 drop Both Eyes QHS  . LORazepam  0.5 mg Oral QHS  . losartan  50 mg Oral QPM  . mouth rinse  15 mL Mouth Rinse BID  . multivitamin  1 tablet Oral Daily  . valACYclovir  500 mg Oral QODAY    SUBJECTIVE:  Mildly increased temperature of 100 today. No leukocytosis. Resting quietly in bed.   Allergies  Allergen Reactions  . Phenergan [Promethazine Hcl] Other (See Comments)    Acute encephalopathy in the context of refusal to go to hemodialysis and administration of Phenergan for nausea and vomiting     Review of Systems: Review of Systems  Unable to perform ROS: Dementia       OBJECTIVE: Vitals:   10/07/18 2131 10/07/18 2308 10/08/18 0502 10/08/18 0922  BP: (!) 180/109 (!) 141/98 (!) 147/96 132/89  Pulse: 74 68 65 65  Resp: 16  17   Temp: 98.2 F (36.8 C)  98.5 F (36.9 C) 100 F (37.8 C)  TempSrc: Oral  Oral Oral  SpO2: 100%  100% 100%  Weight:      Height:       Body mass index is 21.71 kg/m.  Physical Exam Constitutional:      General: She is not in acute distress.    Appearance: She is well-developed.     Comments: Resting in bed with head of bed elevated; pads on the side of the bed.   Cardiovascular:     Rate and Rhythm: Normal rate and regular rhythm.     Heart sounds: Normal heart sounds.  Pulmonary:     Effort: Pulmonary effort is normal.     Breath sounds: Normal breath sounds.  Skin:    General: Skin is warm and dry.  Neurological:     Mental Status: She is alert.     Lab Results Lab Results  Component Value Date   WBC 3.6 (L) 10/08/2018   HGB 7.0 (L) 10/08/2018   HCT 23.2 (L) 10/08/2018   MCV 104.0 (H) 10/08/2018   PLT 189 10/08/2018    Lab Results  Component Value  Date   CREATININE 3.96 (H) 10/08/2018   BUN 22 10/08/2018   NA 129 (L) 10/08/2018   K 3.7 10/08/2018   CL 96 (L) 10/08/2018   CO2 27 10/08/2018    Lab Results  Component Value Date   ALT 8 10/03/2018   AST 18 10/03/2018   ALKPHOS 49 10/03/2018   BILITOT 0.2 (L) 10/03/2018     Microbiology: Recent Results (from the past 240 hour(s))  Blood culture (routine x 2)     Status: Abnormal   Collection Time: 10/03/18  7:30 AM  Result Value Ref Range Status   Specimen Description BLOOD RIGHT HAND  Final   Special Requests   Final    BOTTLES DRAWN AEROBIC AND ANAEROBIC Blood Culture adequate volume Performed at [facility], [place] [address] [place], [place] [address]    Culture  Setup Time   Final    GRAM NEGATIVE RODS AEROBIC BOTTLE ONLY CRITICAL RESULT CALLED TO, READ BACK BY AND VERIFIED WITH: PHARMD BEN MANCHERIL [phone] FCP     Culture ACHROMOBACTER XYLOSOXIDANS (A)  Final   Report Status 10/06/2018 FINAL  Final   Organism ID, Bacteria ACHROMOBACTER XYLOSOXIDANS  Final      Susceptibility   Achromobacter xylosoxidans - MIC*    CEFEPIME 16 INTERMEDIATE Intermediate     CEFAZOLIN >=64 RESISTANT Resistant     GENTAMICIN >=16 RESISTANT Resistant     CIPROFLOXACIN >=4 RESISTANT Resistant     IMIPENEM 8 INTERMEDIATE Intermediate     TRIMETH/SULFA <=20 SENSITIVE Sensitive     CEFTAZIDIME Value in next row Sensitive      SENSITIVE4    PIP/TAZO Value in next row Sensitive      SENSITIVE<=4    * ACHROMOBACTER XYLOSOXIDANS  Blood Culture ID Panel (Reflexed)     Status: None   Collection Time: 10/03/18  7:31 AM  Result Value Ref Range Status   Enterococcus species NOT DETECTED NOT DETECTED Final   Listeria monocytogenes NOT DETECTED NOT DETECTED Final   Staphylococcus species NOT DETECTED NOT DETECTED Final   Staphylococcus aureus (BCID) NOT DETECTED NOT DETECTED Final   Streptococcus species NOT DETECTED NOT DETECTED Final   Streptococcus agalactiae NOT DETECTED NOT DETECTED Final   Streptococcus pneumoniae NOT DETECTED NOT DETECTED Final   Streptococcus pyogenes NOT DETECTED NOT DETECTED Final   Acinetobacter baumannii NOT DETECTED NOT DETECTED Final   Enterobacteriaceae species NOT DETECTED NOT DETECTED Final   Enterobacter cloacae complex NOT DETECTED NOT DETECTED Final   Escherichia coli NOT DETECTED NOT DETECTED Final   Klebsiella oxytoca NOT DETECTED NOT DETECTED Final   Klebsiella pneumoniae NOT DETECTED NOT DETECTED Final   Proteus species NOT DETECTED NOT DETECTED Final   Serratia marcescens NOT DETECTED NOT DETECTED Final   Haemophilus influenzae NOT DETECTED NOT DETECTED Final   Neisseria meningitidis NOT DETECTED NOT DETECTED Final   Pseudomonas aeruginosa NOT DETECTED NOT DETECTED Final   Candida albicans NOT DETECTED NOT DETECTED Final   Candida glabrata NOT DETECTED NOT DETECTED Final   Candida  krusei NOT DETECTED NOT DETECTED Final   Candida parapsilosis NOT DETECTED NOT DETECTED Final   Candida tropicalis NOT DETECTED NOT DETECTED Final  Blood culture (routine x 2)     Status: Abnormal   Collection Time: 10/03/18  7:33 AM  Result Value Ref Range Status   Specimen Description BLOOD RIGHT ANTECUBITAL  Final   Special Requests   Final    BOTTLES DRAWN AEROBIC AND ANAEROBIC Blood Culture results may  not be optimal due to an inadequate volume of blood received in culture bottles   Culture  Setup Time   Final    GRAM POSITIVE COCCI AEROBIC BOTTLE ONLY Organism ID to follow CRITICAL RESULT CALLED TO, READ BACK BY AND VERIFIED WITHKarsten Ro [facility] 2800 10/04/18 A BROWNING Performed at [facility], [place] [address], [place], [place] [address]    Culture STAPHYLOCOCCUS AUREUS (A)  Final   Report Status 10/06/2018 FINAL  Final   Organism ID, Bacteria STAPHYLOCOCCUS AUREUS  Final      Susceptibility   Staphylococcus aureus - MIC*    CIPROFLOXACIN <=0.5 SENSITIVE Sensitive     ERYTHROMYCIN <=0.25 SENSITIVE Sensitive     GENTAMICIN <=0.5 SENSITIVE Sensitive     OXACILLIN 0.5 SENSITIVE Sensitive     TETRACYCLINE <=1 SENSITIVE Sensitive     VANCOMYCIN <=0.5 SENSITIVE Sensitive     TRIMETH/SULFA <=10 SENSITIVE Sensitive     CLINDAMYCIN <=0.25 SENSITIVE Sensitive     RIFAMPIN <=0.5 SENSITIVE Sensitive     Inducible Clindamycin NEGATIVE Sensitive     * STAPHYLOCOCCUS AUREUS  Blood Culture ID Panel (Reflexed)     Status: Abnormal   Collection Time: 10/03/18  7:33 AM  Result Value Ref Range Status   Enterococcus species NOT DETECTED NOT DETECTED Final   Listeria monocytogenes NOT DETECTED NOT DETECTED Final   Staphylococcus species DETECTED (A) NOT DETECTED Final    Comment: CRITICAL RESULT CALLED TO, READ BACK BY AND VERIFIED WITH: J [facility] PHARMD 0240 10/04/18 A BROWNING    Staphylococcus aureus (BCID) DETECTED (A) NOT DETECTED Final    Comment: Methicillin (oxacillin)  susceptible Staphylococcus aureus (MSSA). Preferred therapy is anti staphylococcal beta lactam antibiotic (Cefazolin or Nafcillin), unless clinically contraindicated. CRITICAL RESULT CALLED TO, READ BACK BY AND VERIFIED WITH: Karsten Ro PHARMD 0240 10/04/18 A BROWNING    Methicillin resistance NOT DETECTED NOT DETECTED Final   Streptococcus species NOT DETECTED NOT DETECTED Final   Streptococcus agalactiae NOT DETECTED NOT DETECTED Final   Streptococcus pneumoniae NOT DETECTED NOT DETECTED Final   Streptococcus pyogenes NOT DETECTED NOT DETECTED Final   Acinetobacter baumannii NOT DETECTED NOT DETECTED Final   Enterobacteriaceae species NOT DETECTED NOT DETECTED Final   Enterobacter cloacae complex NOT DETECTED NOT DETECTED Final   Escherichia coli NOT DETECTED NOT DETECTED Final   Klebsiella oxytoca NOT DETECTED NOT DETECTED Final   Klebsiella pneumoniae NOT DETECTED NOT DETECTED Final   Proteus species NOT DETECTED NOT DETECTED Final   Serratia marcescens NOT DETECTED NOT DETECTED Final   Haemophilus influenzae NOT DETECTED NOT DETECTED Final   Neisseria meningitidis NOT DETECTED NOT DETECTED Final   Pseudomonas aeruginosa NOT DETECTED NOT DETECTED Final   Candida albicans NOT DETECTED NOT DETECTED Final   Candida glabrata NOT DETECTED NOT DETECTED Final   Candida krusei NOT DETECTED NOT DETECTED Final   Candida parapsilosis NOT DETECTED NOT DETECTED Final   Candida tropicalis NOT DETECTED NOT DETECTED Final    Comment: Performed at  [facility], [place] [address], [place], [place] [address]  MRSA PCR Screening     Status: None   Collection Time: 10/03/18  6:46 PM  Result Value Ref Range Status   MRSA by PCR NEGATIVE NEGATIVE Final    Comment:        The GeneXpert MRSA Assay (FDA approved for NASAL specimens only), is one component of a comprehensive MRSA colonization surveillance program. It is not intended to diagnose MRSA infection nor to guide or  monitor treatment  for MRSA infections. Performed at [facility], [place] [address] [place], [place] [address]   Culture, blood (Routine X 2) w Reflex to ID Panel     Status: None (Preliminary result)   Collection Time: 10/04/18  1:18 PM  Result Value Ref Range Status   Specimen Description BLOOD BLOOD LEFT HAND  Final   Special Requests AEROBIC BOTTLE ONLY Blood Culture adequate volume  Final   Culture   Final    NO GROWTH 4 DAYS Performed at [facility], [place] [address] [place], [place] [address]    Report Status PENDING  Incomplete  Culture, blood (routine x 2)     Status: None (Preliminary result)   Collection Time: 10/06/18 11:13 AM  Result Value Ref Range Status   Specimen Description BLOOD RIGHT FOREARM  Final   Special Requests   Final    BOTTLES DRAWN AEROBIC AND ANAEROBIC Blood Culture adequate volume   Culture   Final    NO GROWTH 2 DAYS Performed at [facility], [place] [address] [place], [place] [address]    Report Status PENDING  Incomplete  Culture, blood (routine x 2)     Status: None (Preliminary result)   Collection Time: 10/06/18 11:18 AM  Result Value Ref Range Status   Specimen Description BLOOD RIGHT HAND  Final   Special Requests   Final    BOTTLES DRAWN AEROBIC AND ANAEROBIC Blood Culture adequate volume   Culture   Final    NO GROWTH 2 DAYS Performed at [facility], [place] [address] [place], [place] [address]    Report Status PENDING  Incomplete     Terri Piedra, [place] [facility] [place] Pager  10/08/2018  12:00 PM

## 2018-10-08 NOTE — Progress Notes (Signed)
[facility] Progress Note   Subjective:   Seen and examined at bedside.  Less confused and agitated today.  Still not aware of place or date.  No complaints. No events overnight.  Objective Vitals:   10/07/18 2131 10/07/18 2308 10/08/18 0502 10/08/18 0922  BP: (!) 180/109 (!) 141/98 (!) 147/96 132/89  Pulse: 74 68 65 65  Resp: 16  17   Temp: 98.2 F (36.8 C)  98.5 F (36.9 C) 100 F (37.8 C)  TempSrc: Oral  Oral Oral  SpO2: 100%  100% 100%  Weight:      Height:       Physical Exam General:NAD, chronically ill appearing female laying in bed.  Heart:RRR, no mrg Lungs:CTAB Abdomen:soft, NTND Extremities:no LE edema Dialysis Access: R [facility]   Filed Weights   10/04/18 0745 10/04/18 1115 10/07/18 1155  Weight: 57 kg 54.9 kg 61 kg    Intake/Output Summary (Last 24 hours) at 10/08/2018 1320 Last data filed at 10/08/2018 0546 Gross per 24 hour  Intake 160 ml  Output 2500 ml  Net -2340 ml    Additional Objective Labs: Basic Metabolic Panel: Recent Labs  Lab 10/06/18 0349 10/07/18 0540 10/08/18 0423  NA 129* 129* 129*  K 3.8 4.0 3.7  CL 96* 95* 96*  CO2 25 21* 27  GLUCOSE 81 95 76  BUN 35* 55* 22  CREATININE 5.95* 7.39* 3.96*  CALCIUM 8.7* 9.5 8.7*  PHOS  --  3.0  --    Liver Function Tests: Recent Labs  Lab 10/03/18 0325  AST 18  ALT 8  ALKPHOS 49  BILITOT 0.2*  PROT 9.3*  ALBUMIN 2.2*   CBC: Recent Labs  Lab 10/04/18 0519 10/05/18 0300  10/06/18 0349 10/07/18 0540 10/08/18 0423  WBC 4.7 3.9*  --  4.0 5.3 3.6*  NEUTROABS 3.8 2.7  --  2.6 4.0 2.6  HGB 8.0* 7.5*  --  7.0* 8.3* 7.0*  HCT 26.9* 25.3*   < > 23.6* 27.6* 23.2*  MCV 106.3* 104.5*  --  105.8* 105.7* 104.0*  PLT 193 164  --  179 232 189   < > = values in this interval not displayed.   Blood Culture    Component Value Date/Time   SDES BLOOD RIGHT HAND 10/06/2018 1118   SPECREQUEST  10/06/2018 1118    BOTTLES DRAWN AEROBIC AND ANAEROBIC Blood Culture adequate volume    CULT  10/06/2018 1118    NO GROWTH 2 DAYS Performed at [facility], [place] [address] [place], [place] [address]    REPTSTATUS PENDING 10/06/2018 1118   CBG: Recent Labs  Lab 10/03/18 0347  GLUCAP 83   Iron Studies:  Recent Labs    10/05/18 1359  IRON 60  TIBC NOT CALCULATED  FERRITIN 1,623*   Lab Results  Component Value Date   INR 1.15 09/16/2018   INR 1.09 08/25/2018   INR 1.30 08/12/2018   Studies/Results: No results found.  Medications: .  ceFAZolin (ANCEF) IV 2 g (10/07/18 1500)  . cefTAZidime (FORTAZ)  IV 2 g (10/07/18 1501)   . sodium chloride   Intravenous Once  . amLODipine  10 mg Oral QPM  . calcium acetate  1,334 mg Oral TID WC  . Chlorhexidine Gluconate Cloth  6 each Topical Q0600  . Chlorhexidine Gluconate Cloth  6 each Topical Q0600  . cloNIDine  0.2 mg Transdermal Weekly  . doxercalciferol  2 mcg Intravenous Q T,Th,Sa-HD  . feeding supplement (PRO-STAT SUGAR FREE  64)  30 mL Oral BID  . heparin injection (subcutaneous)  5,000 Units Subcutaneous Q8H  . isosorbide mononitrate  60 mg Oral Daily  . lamoTRIgine  50 mg Oral Daily  . latanoprost  1 drop Both Eyes QHS  . LORazepam  0.5 mg Oral QHS  . losartan  50 mg Oral QPM  . mouth rinse  15 mL Mouth Rinse BID  . multivitamin  1 tablet Oral Daily  . valACYclovir  500 mg Oral QODAY    Dialysis Orders: AF TTS 3.5h 425/1.5x EDW 54kg 2K/2.25Ca  TDC No heparin bolus Hectorol 2 mcg IV TIW Retacrit 20000 U IV TIW  Assessment/Plan: 1.Acute encephalopathy - HTN enceph? Recurrent admits w/similar presentation. Head CT w/no acute findings. Waxing and waning confusion.  2. MSSA and GNR Bacteremia - changed to ceftazidime &cefazolin. Rechecking BC - NGTD. ID following. No endocarditis work up d/t comorbidities. Pt has catheter sepsis, putting her through another invasive procedure is not likely to improve her QOL and may just be prolonging things. Transition to comfort care would be supported w/ her  signicant decline and comorbidities.  3. ESRD -On HD TTS. K3.7. HD yesterday with net UF 2.5L removed.  Plan for HD tomorrow per regular schedule w/3K bath.   4. Anemia of CKD-Hgb improved today 8.0>7.5>7.0>8.3>7.0. ?8.3 was false high. No iron due to high ferritin. Patient is Jehov[employer] Witness and refused blood transfusion. Follow trends. 5. Secondary hyperparathyroidism -Ca and phos at goal.  Continue VDRA/binders.  6. HTN/volume -BP improved today. Continue meds. Does not appear volume overloaded on exam. Need weights to better assess EDW.  7. Nutrition -Renal diet w/fluid restrictions. Vitamins. Prostat.  8. Multiple myeloma - followed by [name] onc 9. FTT - seen by palliative care. Poor prognosis.   Jen Mow, PA-C [facility] Pager: [phone] 10/08/2018,1:20 PM  LOS: 5 days

## 2018-10-08 NOTE — Progress Notes (Signed)
PROGRESS NOTE        PATIENT DETAILS Name: [name] Age: 78 y.o. Sex: female Date of Birth: [birth year] Admit Date: 10/03/2018 Admitting Physician Kayleen Memos, DO YQM:VHQION, Darrick Penna, MD  Brief Narrative: Patient is a 78 y.o. female history of ESRD on HD TTS, multiple myeloma, dementia, chronic diastolic heart failure, chronic hyponatremia-ambulatory dysfunction-bed to wheelchair bound-presented with worsening confusion-thought to be secondary to hypertensive encephalopathy and recurrent bacteremia.  See below for further details  Subjective: Continues to be confused this morning.  To multiple attempts-was able to tell me her name.  Assessment/Plan: Metabolic encephalopathy: Although patient does have some amount of dementia at baseline-encephalopathy is thought to be mostly metabolic-secondary to bacteremia and also from hypertensive encephalopathy.  Continues to be confused.  CT head on 10/04/2018 without any acute findings.  MSSA/Achromobacter bacteremia: ID following-remains on cefazolin and ceftazidime-we will await further recommendations from ID.  Hypertensive emergency: Blood pressure better controlled-continue transdermal clonidine, amlodipine, Imdur, losartan  Anemia: Secondary to CKD/ESRD-she is asymptomatic at this point-repeat CBC tomorrow-if still on the lower side-we will transfuse with HD.  Worsening hemoglobin is likely secondary to acute illness/bacteremia.  No evidence of overt blood loss.  ESRD: Nephrology following-HD TTS.  Chronic diastolic heart failure: Volume management with HD.  Chronic hyponatremia: At baseline-management with HD per nephrology.  Dysphagia: Seen by speech therapy-continue dysphagia 1 diet with full aspiration precautions.  Moderate pulmonary hypertension: Continue outpatient follow-up with cardiology  Failure to thrive syndrome/debility/deconditioning: Very poor overall prognosis-mostly bed to wheelchair  bound-has further worsening of her chronic debility/deconditioning due to acute illness.  Her overall quality of life is very poor-her overall prognosis is also very poor-agree with palliative care-may be best to transition to full comfort care if patient's family elects to stop HD.  DVT Prophylaxis: Prophylactic Lovenox or Heparin or SCD's Full dose anticoagulation with Heparin/Lovenox/Coumadin/Eliquis/Xarelto/Pradaxa  Code Status: DNR  Family Communication: None at bedside  Disposition Plan: Remain inpatient  Antimicrobial agents: Anti-infectives (From admission, onward)   Start     Dose/Rate Route Frequency Ordered Stop   10/07/18 1200  ceFAZolin (ANCEF) IVPB 2g/100 mL premix     2 g 200 mL/hr over 30 Minutes Intravenous Every T-Th-Sa (Hemodialysis) 10/06/18 1004     10/07/18 1200  cefTAZidime (FORTAZ) 2 g in sodium chloride 0.9 % 100 mL IVPB     2 g 200 mL/hr over 30 Minutes Intravenous Every T-Th-Sa (Hemodialysis) 10/06/18 1004     10/06/18 1015  cefTAZidime (FORTAZ) 2 g in sodium chloride 0.9 % 100 mL IVPB     2 g 200 mL/hr over 30 Minutes Intravenous  Once 10/06/18 1004 10/06/18 1222   10/04/18 2000  ceFEPIme (MAXIPIME) 1 g in sodium chloride 0.9 % 100 mL IVPB  Status:  Discontinued     1 g 200 mL/hr over 30 Minutes Intravenous Every 24 hours 10/04/18 1829 10/06/18 1004   10/04/18 1800  ceFAZolin (ANCEF) IVPB 2g/100 mL premix  Status:  Discontinued     2 g 200 mL/hr over 30 Minutes Intravenous Every T-Th-Sa (1800) 10/04/18 1313 10/04/18 1829   10/03/18 1400  valACYclovir (VALTREX) tablet 500 mg     500 mg Oral Every other day 10/03/18 0852     10/03/18 1330  piperacillin-tazobactam (ZOSYN) IVPB 3.375 g  Status:  Discontinued     3.375 g 12.5 mL/hr over  240 Minutes Intravenous Every 12 hours 10/03/18 1317 10/04/18 1255   10/03/18 1315  piperacillin-tazobactam (ZOSYN) injection 2.25 g  Status:  Discontinued     2.25 g Intramuscular Every 12 hours 10/03/18 1305 10/03/18  1316      Procedures: None  CONSULTS:  nephrology and Palliative care, ID  Time spent: 25- minutes-Greater than 50% of this time was spent in counseling, explanation of diagnosis, planning of further management, and coordination of care.  MEDICATIONS: Scheduled Meds: . sodium chloride   Intravenous Once  . amLODipine  10 mg Oral QPM  . calcium acetate  1,334 mg Oral TID WC  . Chlorhexidine Gluconate Cloth  6 each Topical Q0600  . Chlorhexidine Gluconate Cloth  6 each Topical Q0600  . cloNIDine  0.2 mg Transdermal Weekly  . doxercalciferol  2 mcg Intravenous Q T,Th,Sa-HD  . feeding supplement (PRO-STAT SUGAR FREE 64)  30 mL Oral BID  . heparin injection (subcutaneous)  5,000 Units Subcutaneous Q8H  . isosorbide mononitrate  60 mg Oral Daily  . lamoTRIgine  50 mg Oral Daily  . latanoprost  1 drop Both Eyes QHS  . LORazepam  0.5 mg Oral QHS  . losartan  50 mg Oral QPM  . mouth rinse  15 mL Mouth Rinse BID  . multivitamin  1 tablet Oral Daily  . valACYclovir  500 mg Oral QODAY   Continuous Infusions: .  ceFAZolin (ANCEF) IV 2 g (10/07/18 1500)  . cefTAZidime (FORTAZ)  IV 2 g (10/07/18 1501)   PRN Meds:.acetaminophen, cloNIDine, hydrALAZINE, lactulose   PHYSICAL EXAM: Vital signs: Vitals:   10/07/18 2131 10/07/18 2308 10/08/18 0502 10/08/18 0922  BP: (!) 180/109 (!) 141/98 (!) 147/96 132/89  Pulse: 74 68 65 65  Resp: 16  17   Temp: 98.2 F (36.8 C)  98.5 F (36.9 C) 100 F (37.8 C)  TempSrc: Oral  Oral Oral  SpO2: 100%  100% 100%  Weight:      Height:       Filed Weights   10/04/18 0745 10/04/18 1115 10/07/18 1155  Weight: 57 kg 54.9 kg 61 kg   Body mass index is 21.71 kg/m.   General appearance :Awake, confused-not in any distress HEENT: Atraumatic and Normocephalic Neck: supple Resp:Good air entry bilaterally, no added sounds  CVS: S1 S2 regular GI: Bowel sounds present, Non tender and not distended with no gaurding, rigidity or rebound.No  organomegaly Extremities: B/L Lower Ext shows no edema  I have personally reviewed following labs and imaging studies  LABORATORY DATA: CBC: Recent Labs  Lab 10/04/18 0519 10/05/18 0300 10/05/18 1359 10/06/18 0349 10/07/18 0540 10/08/18 0423  WBC 4.7 3.9*  --  4.0 5.3 3.6*  NEUTROABS 3.8 2.7  --  2.6 4.0 2.6  HGB 8.0* 7.5*  --  7.0* 8.3* 7.0*  HCT 26.9* 25.3* 21.9* 23.6* 27.6* 23.2*  MCV 106.3* 104.5*  --  105.8* 105.7* 104.0*  PLT 193 164  --  179 232 675    Basic Metabolic Panel: Recent Labs  Lab 10/04/18 0519 10/05/18 0300 10/06/18 0349 10/07/18 0540 10/08/18 0423  NA 131* 131* 129* 129* 129*  K 4.3 3.7 3.8 4.0 3.7  CL 97* 97* 96* 95* 96*  CO2 '22 28 25 ' 21* 27  GLUCOSE 76 71 81 95 76  BUN 39* 19 35* 55* 22  CREATININE 8.38* 5.01* 5.95* 7.39* 3.96*  CALCIUM 8.8* 8.4* 8.7* 9.5 8.7*  PHOS  --   --   --  3.0  --  GFR: Estimated Creatinine Clearance: 10.8 mL/min (A) (by C-G formula based on SCr of 3.96 mg/dL (H)).  Liver Function Tests: Recent Labs  Lab 10/03/18 0325  AST 18  ALT 8  ALKPHOS 49  BILITOT 0.2*  PROT 9.3*  ALBUMIN 2.2*   No results for input(s): LIPASE, AMYLASE in the last 168 hours. Recent Labs  Lab 10/03/18 0326  AMMONIA 19    Coagulation Profile: No results for input(s): INR, PROTIME in the last 168 hours.  Cardiac Enzymes: No results for input(s): CKTOTAL, CKMB, CKMBINDEX, TROPONINI in the last 168 hours.  BNP (last 3 results) No results for input(s): PROBNP in the last 8760 hours.  HbA1C: No results for input(s): HGBA1C in the last 72 hours.  CBG: Recent Labs  Lab 10/03/18 0347  GLUCAP 83    Lipid Profile: No results for input(s): CHOL, HDL, LDLCALC, TRIG, CHOLHDL, LDLDIRECT in the last 72 hours.  Thyroid Function Tests: No results for input(s): TSH, T4TOTAL, FREET4, T3FREE, THYROIDAB in the last 72 hours.  Anemia Panel: Recent Labs    10/05/18 1359  VITAMINB12 600  FERRITIN 1,623*  TIBC NOT CALCULATED   IRON 60  RETICCTPCT 2.0    Urine analysis:    Component Value Date/Time   COLORURINE STRAW (A) 09/16/2018 0326   APPEARANCEUR CLEAR 09/16/2018 0326   LABSPEC 1.010 09/16/2018 0326   PHURINE 9.0 (H) 09/16/2018 0326   GLUCOSEU 50 (A) 09/16/2018 0326   HGBUR NEGATIVE 09/16/2018 0326   BILIRUBINUR NEGATIVE 09/16/2018 0326   KETONESUR 5 (A) 09/16/2018 0326   PROTEINUR 100 (A) 09/16/2018 0326   UROBILINOGEN 0.2 04/10/2015 1800   NITRITE NEGATIVE 09/16/2018 0326   LEUKOCYTESUR NEGATIVE 09/16/2018 0326    Sepsis Labs: Lactic Acid, Venous    Component Value Date/Time   LATICACIDVEN 1.31 09/16/2018 0334    MICROBIOLOGY: Recent Results (from the past 240 hour(s))  Blood culture (routine x 2)     Status: Abnormal   Collection Time: 10/03/18  7:30 AM  Result Value Ref Range Status   Specimen Description BLOOD RIGHT HAND  Final   Special Requests   Final    BOTTLES DRAWN AEROBIC AND ANAEROBIC Blood Culture adequate volume Performed at [facility], [place] [address] [place], [place] [address]    Culture  Setup Time   Final    GRAM NEGATIVE RODS AEROBIC BOTTLE ONLY CRITICAL RESULT CALLED TO, READ BACK BY AND VERIFIED WITH: PHARMD BEN MANCHERIL [id number] FCP    Culture ACHROMOBACTER XYLOSOXIDANS (A)  Final   Report Status 10/06/2018 FINAL  Final   Organism ID, Bacteria ACHROMOBACTER XYLOSOXIDANS  Final      Susceptibility   Achromobacter xylosoxidans - MIC*    CEFEPIME 16 INTERMEDIATE Intermediate     CEFAZOLIN >=64 RESISTANT Resistant     GENTAMICIN >=16 RESISTANT Resistant     CIPROFLOXACIN >=4 RESISTANT Resistant     IMIPENEM 8 INTERMEDIATE Intermediate     TRIMETH/SULFA <=20 SENSITIVE Sensitive     CEFTAZIDIME Value in next row Sensitive      SENSITIVE4    PIP/TAZO Value in next row Sensitive      SENSITIVE<=4    * ACHROMOBACTER XYLOSOXIDANS  Blood Culture ID Panel (Reflexed)     Status: None   Collection Time: 10/03/18  7:31 AM  Result Value Ref Range Status    Enterococcus species NOT DETECTED NOT DETECTED Final   Listeria monocytogenes NOT DETECTED NOT DETECTED Final   Staphylococcus species NOT DETECTED NOT DETECTED Final  Staphylococcus aureus (BCID) NOT DETECTED NOT DETECTED Final   Streptococcus species NOT DETECTED NOT DETECTED Final   Streptococcus agalactiae NOT DETECTED NOT DETECTED Final   Streptococcus pneumoniae NOT DETECTED NOT DETECTED Final   Streptococcus pyogenes NOT DETECTED NOT DETECTED Final   Acinetobacter baumannii NOT DETECTED NOT DETECTED Final   Enterobacteriaceae species NOT DETECTED NOT DETECTED Final   Enterobacter cloacae complex NOT DETECTED NOT DETECTED Final   Escherichia coli NOT DETECTED NOT DETECTED Final   Klebsiella oxytoca NOT DETECTED NOT DETECTED Final   Klebsiella pneumoniae NOT DETECTED NOT DETECTED Final   Proteus species NOT DETECTED NOT DETECTED Final   Serratia marcescens NOT DETECTED NOT DETECTED Final   Haemophilus influenzae NOT DETECTED NOT DETECTED Final   Neisseria meningitidis NOT DETECTED NOT DETECTED Final   Pseudomonas aeruginosa NOT DETECTED NOT DETECTED Final   Candida albicans NOT DETECTED NOT DETECTED Final   Candida glabrata NOT DETECTED NOT DETECTED Final   Candida krusei NOT DETECTED NOT DETECTED Final   Candida parapsilosis NOT DETECTED NOT DETECTED Final   Candida tropicalis NOT DETECTED NOT DETECTED Final  Blood culture (routine x 2)     Status: Abnormal   Collection Time: 10/03/18  7:33 AM  Result Value Ref Range Status   Specimen Description BLOOD RIGHT ANTECUBITAL  Final   Special Requests   Final    BOTTLES DRAWN AEROBIC AND ANAEROBIC Blood Culture results may not be optimal due to an inadequate volume of blood received in culture bottles   Culture  Setup Time   Final    GRAM POSITIVE COCCI AEROBIC BOTTLE ONLY Organism ID to follow CRITICAL RESULT CALLED TO, READ BACK BY AND VERIFIED WITHKarsten Ro PHARMD [id number] 10/04/18 A BROWNING Performed at [facility]  Lab, [address], [place], [place] [address]    Culture STAPHYLOCOCCUS AUREUS (A)  Final   Report Status 10/06/2018 FINAL  Final   Organism ID, Bacteria STAPHYLOCOCCUS AUREUS  Final      Susceptibility   Staphylococcus aureus - MIC*    CIPROFLOXACIN <=0.5 SENSITIVE Sensitive     ERYTHROMYCIN <=0.25 SENSITIVE Sensitive     GENTAMICIN <=0.5 SENSITIVE Sensitive     OXACILLIN 0.5 SENSITIVE Sensitive     TETRACYCLINE <=1 SENSITIVE Sensitive     VANCOMYCIN <=0.5 SENSITIVE Sensitive     TRIMETH/SULFA <=10 SENSITIVE Sensitive     CLINDAMYCIN <=0.25 SENSITIVE Sensitive     RIFAMPIN <=0.5 SENSITIVE Sensitive     Inducible Clindamycin NEGATIVE Sensitive     * STAPHYLOCOCCUS AUREUS  Blood Culture ID Panel (Reflexed)     Status: Abnormal   Collection Time: 10/03/18  7:33 AM  Result Value Ref Range Status   Enterococcus species NOT DETECTED NOT DETECTED Final   Listeria monocytogenes NOT DETECTED NOT DETECTED Final   Staphylococcus species DETECTED (A) NOT DETECTED Final    Comment: CRITICAL RESULT CALLED TO, READ BACK BY AND VERIFIED WITH: J [facility] PHARMD 0240 10/04/18 A BROWNING    Staphylococcus aureus (BCID) DETECTED (A) NOT DETECTED Final    Comment: Methicillin (oxacillin) susceptible Staphylococcus aureus (MSSA). Preferred therapy is anti staphylococcal beta lactam antibiotic (Cefazolin or Nafcillin), unless clinically contraindicated. CRITICAL RESULT CALLED TO, READ BACK BY AND VERIFIED WITH: J LEDFORD PHARMD 0240 10/04/18 A BROWNING    Methicillin resistance NOT DETECTED NOT DETECTED Final   Streptococcus species NOT DETECTED NOT DETECTED Final   Streptococcus agalactiae NOT DETECTED NOT DETECTED Final   Streptococcus pneumoniae NOT DETECTED NOT DETECTED Final   Streptococcus  pyogenes NOT DETECTED NOT DETECTED Final   Acinetobacter baumannii NOT DETECTED NOT DETECTED Final   Enterobacteriaceae species NOT DETECTED NOT DETECTED Final   Enterobacter cloacae complex NOT DETECTED NOT  DETECTED Final   Escherichia coli NOT DETECTED NOT DETECTED Final   Klebsiella oxytoca NOT DETECTED NOT DETECTED Final   Klebsiella pneumoniae NOT DETECTED NOT DETECTED Final   Proteus species NOT DETECTED NOT DETECTED Final   Serratia marcescens NOT DETECTED NOT DETECTED Final   Haemophilus influenzae NOT DETECTED NOT DETECTED Final   Neisseria meningitidis NOT DETECTED NOT DETECTED Final   Pseudomonas aeruginosa NOT DETECTED NOT DETECTED Final   Candida albicans NOT DETECTED NOT DETECTED Final   Candida glabrata NOT DETECTED NOT DETECTED Final   Candida krusei NOT DETECTED NOT DETECTED Final   Candida parapsilosis NOT DETECTED NOT DETECTED Final   Candida tropicalis NOT DETECTED NOT DETECTED Final    Comment: Performed at [facility], [place] [name] [place], [place] [address]  MRSA PCR Screening     Status: None   Collection Time: 10/03/18  6:46 PM  Result Value Ref Range Status   MRSA by PCR NEGATIVE NEGATIVE Final    Comment:        The GeneXpert MRSA Assay (FDA approved for NASAL specimens only), is one component of a comprehensive MRSA colonization surveillance program. It is not intended to diagnose MRSA infection nor to guide or monitor treatment for MRSA infections. Performed at [facility], [place] [address], [place], [place] [address]   Culture, blood (Routine X 2) w Reflex to ID Panel     Status: None (Preliminary result)   Collection Time: 10/04/18  1:18 PM  Result Value Ref Range Status   Specimen Description BLOOD BLOOD LEFT HAND  Final   Special Requests AEROBIC BOTTLE ONLY Blood Culture adequate volume  Final   Culture   Final    NO GROWTH 4 DAYS Performed at [facility], [place] [address] [place], [place] [address]    Report Status PENDING  Incomplete  Culture, blood (routine x 2)     Status: None (Preliminary result)   Collection Time: 10/06/18 11:13 AM  Result Value Ref Range Status   Specimen Description BLOOD RIGHT FOREARM  Final    Special Requests   Final    BOTTLES DRAWN AEROBIC AND ANAEROBIC Blood Culture adequate volume   Culture   Final    NO GROWTH 2 DAYS Performed at [facility], [place] [address], [place], [place] [address]    Report Status PENDING  Incomplete  Culture, blood (routine x 2)     Status: None (Preliminary result)   Collection Time: 10/06/18 11:18 AM  Result Value Ref Range Status   Specimen Description BLOOD RIGHT HAND  Final   Special Requests   Final    BOTTLES DRAWN AEROBIC AND ANAEROBIC Blood Culture adequate volume   Culture   Final    NO GROWTH 2 DAYS Performed at [facility], [place] [address], [place],  [address]    Report Status PENDING  Incomplete    RADIOLOGY STUDIES/RESULTS: Ct Head Wo Contrast  Result Date: 10/04/2018 CLINICAL DATA:  Head trauma, ataxia EXAM: CT HEAD WITHOUT CONTRAST CT CERVICAL SPINE WITHOUT CONTRAST TECHNIQUE: Multidetector CT imaging of the head and cervical spine was performed following the standard protocol without intravenous contrast. Multiplanar CT image reconstructions of the cervical spine were also generated. COMPARISON:  Head CT yesterday at 0455 hour, brain  MRI 09/16/2018. Cervical spine CT 07/26/2018 FINDINGS: CT HEAD FINDINGS Brain: No intracranial hemorrhage, mass effect, or midline shift. No hydrocephalus. The basilar cisterns are patent. No evidence of territorial infarct or acute ischemia. No extra-axial or intracranial fluid collection. Stable degree of atrophy and chronic small vessel ischemia. Remote left cerebellar infarct. Vascular: Atherosclerosis of skullbase vasculature without hyperdense vessel or abnormal calcification. Skull: Innumerable skull lesions consistent with patient's history of myeloma, unchanged. No skull fracture. Sinuses/Orbits: No acute finding. Other: None. CT CERVICAL SPINE FINDINGS Alignment: Trace anterolisthesis of C2 on C3, unchanged. No traumatic subluxation. Skull base and vertebrae: No acute fracture.  Multiple small lucencies consistent with multiple myeloma, unchanged from prior exam. No evidence of pathologic fracture. Skull base is intact. Soft tissues and spinal canal: No prevertebral fluid or swelling. No visible canal hematoma. Disc levels: Multilevel degenerative disc disease with disc space narrowing and endplate spurring, most prominent at C3-C4 and C6-C7. Multilevel facet arthropathy. Upper chest: Right internal jugular dialysis catheter. No acute findings. Other: Myelomatous lesions noted in the right clavicle, bilateral scapula,, sternum and multiple ribs. IMPRESSION: 1. No acute intracranial abnormality. No skull fracture. Stable atrophy and chronic small vessel ischemia. 2. Multilevel degenerative change in the cervical spine without acute fracture or subluxation. 3. Multiple osseous lesions in the calvarium and cervical spine consistent with known multiple myeloma. Electronically Signed   By: Keith Rake M.D.   On: 10/04/2018 02:32   Ct Head Wo Contrast  Result Date: 10/03/2018 CLINICAL DATA:  Acute onset of altered mental status. EXAM: CT HEAD WITHOUT CONTRAST TECHNIQUE: Contiguous axial images were obtained from the base of the skull through the vertex without intravenous contrast. COMPARISON:  CT of the head and MRI of the brain performed 09/16/2018 FINDINGS: Brain: No evidence of acute infarction, hemorrhage, hydrocephalus, extra-axial collection or mass lesion / mass effect. Prominence of the ventricles and sulci reflects mild cortical volume loss. Mild cerebellar atrophy is noted. Scattered periventricular and subcortical white matter change likely reflects small vessel ischemic microangiopathy. Chronic ischemic change is noted at the basal ganglia bilaterally. A small chronic lacunar infarct is noted at the left cerebellar hemisphere. The brainstem and fourth ventricle are within normal limits. The cerebral hemispheres demonstrate grossly normal gray-white differentiation. No mass  effect or midline shift is seen. Vascular: No hyperdense vessel or unexpected calcification. Skull: There is no evidence of fracture. Numerous large lytic lesions are noted throughout the visualized osseous structures, measuring up to 2.0 cm in size. These likely reflect the patient's known multiple myeloma. Sinuses/Orbits: The orbits are within normal limits. The paranasal sinuses and mastoid air cells are well-aerated. Other: No significant soft tissue abnormalities are seen. IMPRESSION: 1. No acute intracranial pathology seen on CT. 2. Mild cortical volume loss and scattered small vessel ischemic microangiopathy. 3. Chronic ischemic change at the basal ganglia bilaterally. Small chronic lacunar infarct at the left cerebellar hemisphere. 4. Numerous large lytic lesions throughout the visualized osseous structures, measuring up to 2.0 cm in size. These likely reflect the patient's known multiple myeloma. Electronically Signed   By: Garald Balding M.D.   On: 10/03/2018 05:11   Ct Cervical Spine Wo Contrast  Result Date: 10/04/2018 CLINICAL DATA:  Head trauma, ataxia EXAM: CT HEAD WITHOUT CONTRAST CT CERVICAL SPINE WITHOUT CONTRAST TECHNIQUE: Multidetector CT imaging of the head and cervical spine was performed following the standard protocol without intravenous contrast. Multiplanar CT image reconstructions of the cervical spine were also generated. COMPARISON:  Head CT yesterday at 0455 hour,  brain MRI 09/16/2018. Cervical spine CT 07/26/2018 FINDINGS: CT HEAD FINDINGS Brain: No intracranial hemorrhage, mass effect, or midline shift. No hydrocephalus. The basilar cisterns are patent. No evidence of territorial infarct or acute ischemia. No extra-axial or intracranial fluid collection. Stable degree of atrophy and chronic small vessel ischemia. Remote left cerebellar infarct. Vascular: Atherosclerosis of skullbase vasculature without hyperdense vessel or abnormal calcification. Skull: Innumerable skull lesions  consistent with patient's history of myeloma, unchanged. No skull fracture. Sinuses/Orbits: No acute finding. Other: None. CT CERVICAL SPINE FINDINGS Alignment: Trace anterolisthesis of C2 on C3, unchanged. No traumatic subluxation. Skull base and vertebrae: No acute fracture. Multiple small lucencies consistent with multiple myeloma, unchanged from prior exam. No evidence of pathologic fracture. Skull base is intact. Soft tissues and spinal canal: No prevertebral fluid or swelling. No visible canal hematoma. Disc levels: Multilevel degenerative disc disease with disc space narrowing and endplate spurring, most prominent at C3-C4 and C6-C7. Multilevel facet arthropathy. Upper chest: Right internal jugular dialysis catheter. No acute findings. Other: Myelomatous lesions noted in the right clavicle, bilateral scapula,, sternum and multiple ribs. IMPRESSION: 1. No acute intracranial abnormality. No skull fracture. Stable atrophy and chronic small vessel ischemia. 2. Multilevel degenerative change in the cervical spine without acute fracture or subluxation. 3. Multiple osseous lesions in the calvarium and cervical spine consistent with known multiple myeloma. Electronically Signed   By: Keith Rake M.D.   On: 10/04/2018 02:32   Mr Brain Wo Contrast  Result Date: 09/16/2018 CLINICAL DATA:  Altered mental status and weakness. History of end-stage renal disease on dialysis, multiple myeloma, hypertension. EXAM: MRI HEAD WITHOUT CONTRAST TECHNIQUE: Multiplanar, multiecho pulse sequences of the brain and surrounding structures were obtained without intravenous contrast. COMPARISON:  CT HEAD September 16, 2018 and MRI head April 16, 2018 FINDINGS: INTRACRANIAL CONTENTS: No reduced diffusion to suggest acute ischemia. Numerous infratentorial to lesser extent supratentorial chronic microhemorrhages. Old small LEFT cerebellar infarct. Hazy T2 hyperintense signal bilateral basal ganglia a increased from prior MRI. Old  bilateral basal ganglia and LEFT thalamus infarcts. Patchy supratentorial white matter FLAIR T2 hyperintensities. The ventricles and sulci are normal for patient's age. No suspicious parenchymal signal, masses, mass effect. No abnormal extra-axial fluid collections. No extra-axial masses. VASCULAR: Normal major intracranial vascular flow voids present at skull base. SKULL AND UPPER CERVICAL SPINE: No abnormal sellar expansion. Mildly reduced diffusion with low ADC values of numerous calvarial metastasis corresponding to known multiple myeloma. Heterogeneous calvarial and cervical spine bone marrow signal consistent with myeloproliferative disease. Craniocervical junction maintained. SINUSES/ORBITS: The mastoid air-cells and included paranasal sinuses are well-aerated.The included ocular globes and orbital contents are non-suspicious. OTHER: Patient is edentulous. IMPRESSION: 1. No acute infarct. 2. Increased basal ganglia and thalami edema seen with hypertensive encephalopathy, less likely rapidly progressing small vessel ischemic changes. 3. Moderate chronic small vessel ischemic changes. 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 4. Multiple osseous metastasis/multiple myeloma. Electronically Signed   By: Elon Alas M.D.   On: 09/16/2018 05:24   Dg Chest Port 1 View  Result Date: 09/16/2018 CLINICAL DATA:  Altered mental status. EXAM: PORTABLE CHEST 1 VIEW COMPARISON:  Radiographs 08/25/2018 FINDINGS: Right internal jugular dialysis catheter tip in the distal SVC. Unchanged cardiomegaly. Unchanged aortic tortuosity. Minimal vascular congestion without pulmonary edema. No focal airspace disease, large pleural effusion or pneumothorax. No acute osseous abnormalities are seen. IMPRESSION: Stable cardiomegaly. Minimal vascular congestion. Electronically Signed   By: Keith Rake M.D.   On: 09/16/2018 01:57   Ct Head  Code Stroke Wo Contrast  Result Date: 09/16/2018 CLINICAL DATA:  Code  stroke. RIGHT facial droop. Aphasic. History of multiple myeloma, hypertension. EXAM: CT HEAD WITHOUT CONTRAST TECHNIQUE: Contiguous axial images were obtained from the base of the skull through the vertex without intravenous contrast. COMPARISON:  CT HEAD August 25, 2018 and MRI head April 16, 2016 FINDINGS: Mild motion degraded examination. BRAIN: No intraparenchymal hemorrhage, mass effect nor midline shift. The ventricles and sulci are normal for age. 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 Old small LEFT cerebellar infarct. Patchy supratentorial white matter hypodensities. No acute large vascular territory infarcts. No abnormal extra-axial fluid collections. Basal cisterns are patent. VASCULAR: Mild calcific atherosclerosis of the carotid siphons. SKULL: No skull fracture. Multiple soft tissue masses within the calvarium including LEFT sphenoid eroding the inner and outer tables. No significant scalp soft tissue swelling. SINUSES/ORBITS: Mild paranasal sinus mucosal thickening. Mastoid air cells are well aerated. Soft tissue within the external auditory canals most compatible with cerumen.The included ocular globes and orbital contents are non-suspicious. OTHER: None. ASPECTS [facility] Stroke Program Early CT Score) - Ganglionic level infarction (caudate, lentiform nuclei, internal capsule, insula, M1-M3 cortex): 7 - Supraganglionic infarction (M4-M6 cortex): 3 Total score (0-10 with 10 being normal): 10 IMPRESSION: 1. Mild motion degraded examination.  No acute intracranial process. 2. ASPECTS is 10. 3. Mild chronic small vessel ischemic changes. 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 4. Multiple osseous lesions consistent with known multiple myeloma. 5. Critical Value/emergent results text paged to Victoria Vera via AMION secure system on 09/16/2018 at 12:18 am, including interpreting physician's phone number. Electronically Signed   By: Elon Alas M.D.   On:  09/16/2018 00:18     LOS: 5 days   Oren Binet, MD  [facility]  If 7PM-7AM, please contact night-coverage  Please page via [email or web address]-click on MD name and type text message  10/08/2018, 1:01 PM

## 2018-10-09 DIAGNOSIS — D649 Anemia, unspecified: Secondary | ICD-10-CM

## 2018-10-09 DIAGNOSIS — M546 Pain in thoracic spine: Secondary | ICD-10-CM

## 2018-10-09 DIAGNOSIS — Z5329 Procedure and treatment not carried out because of patient's decision for other reasons: Secondary | ICD-10-CM

## 2018-10-09 LAB — CBC WITH DIFFERENTIAL/PLATELET
Abs Immature Granulocytes: 0 10*3/uL (ref 0.00–0.07)
Basophils Absolute: 0 10*3/uL (ref 0.0–0.1)
Basophils Relative: 0 %
Eosinophils Absolute: 0.1 10*3/uL (ref 0.0–0.5)
Eosinophils Relative: 2 %
HEMATOCRIT: 20.4 % — AB (ref 36.0–46.0)
Hemoglobin: 5.9 g/dL — CL (ref 12.0–15.0)
Lymphocytes Relative: 19 %
Lymphs Abs: 0.6 10*3/uL — ABNORMAL LOW (ref 0.7–4.0)
MCH: 30.7 pg (ref 26.0–34.0)
MCHC: 28.9 g/dL — ABNORMAL LOW (ref 30.0–36.0)
MCV: 106.3 fL — AB (ref 80.0–100.0)
Monocytes Absolute: 0.1 10*3/uL (ref 0.1–1.0)
Monocytes Relative: 3 %
NEUTROS ABS: 2.5 10*3/uL (ref 1.7–7.7)
Neutrophils Relative %: 76 %
Platelets: 183 10*3/uL (ref 150–400)
RBC: 1.92 MIL/uL — ABNORMAL LOW (ref 3.87–5.11)
RDW: 18.2 % — ABNORMAL HIGH (ref 11.5–15.5)
WBC: 3.3 10*3/uL — ABNORMAL LOW (ref 4.0–10.5)
nRBC: 0 % (ref 0.0–0.2)
nRBC: 0 /100 WBC

## 2018-10-09 LAB — RENAL FUNCTION PANEL
Albumin: 1.7 g/dL — ABNORMAL LOW (ref 3.5–5.0)
Anion gap: 6 (ref 5–15)
BUN: 38 mg/dL — ABNORMAL HIGH (ref 8–23)
CO2: 28 mmol/L (ref 22–32)
Calcium: 9.1 mg/dL (ref 8.9–10.3)
Chloride: 97 mmol/L — ABNORMAL LOW (ref 98–111)
Creatinine, Ser: 5.56 mg/dL — ABNORMAL HIGH (ref 0.44–1.00)
GFR calc Af Amer: 8 mL/min — ABNORMAL LOW (ref >60)
GFR calc non Af Amer: 7 mL/min — ABNORMAL LOW (ref >60)
Glucose, Bld: 75 mg/dL (ref 70–99)
POTASSIUM: 4.1 mmol/L (ref 3.5–5.1)
Phosphorus: 3.4 mg/dL (ref 2.5–4.6)
Sodium: 131 mmol/L — ABNORMAL LOW (ref 135–145)

## 2018-10-09 LAB — GLUCOSE, CAPILLARY: Glucose-Capillary: 93 mg/dL (ref 70–99)

## 2018-10-09 LAB — CULTURE, BLOOD (ROUTINE X 2)
Culture: NO GROWTH
Special Requests: ADEQUATE

## 2018-10-09 MED ORDER — LORAZEPAM 2 MG/ML PO CONC: 1.0000 mg | ORAL | Status: DC | PRN

## 2018-10-09 MED ORDER — LORAZEPAM 1 MG PO TABS
1.0000 mg | ORAL_TABLET | ORAL | Status: DC | PRN
  Administered 2018-10-13: 1 mg via ORAL
  Filled 2018-10-09 (×2): qty 1

## 2018-10-09 MED ORDER — ACETAMINOPHEN 650 MG RE SUPP: 650.0000 mg | Freq: Four times a day (QID) | RECTAL | Status: DC | PRN

## 2018-10-09 MED ORDER — LORAZEPAM 2 MG/ML IJ SOLN: 1.0000 mg | INTRAMUSCULAR | Status: DC | PRN

## 2018-10-09 MED ORDER — BIOTENE DRY MOUTH MT LIQD: 15.0000 mL | OROMUCOSAL | Status: DC | PRN

## 2018-10-09 MED ORDER — HYDROMORPHONE HCL 1 MG/ML IJ SOLN
0.5000 mg | INTRAMUSCULAR | Status: DC | PRN
  Administered 2018-10-13 – 2018-10-14 (×3): 0.5 mg via INTRAVENOUS
  Filled 2018-10-09 (×3): qty 0.5

## 2018-10-09 MED ORDER — GLYCOPYRROLATE 1 MG PO TABS: 1.0000 mg | ORAL_TABLET | ORAL | Status: DC | PRN

## 2018-10-09 MED ORDER — POLYVINYL ALCOHOL 1.4 % OP SOLN
1.0000 [drp] | Freq: Four times a day (QID) | OPHTHALMIC | Status: DC | PRN
  Administered 2018-10-09: 1 [drp] via OPHTHALMIC
  Filled 2018-10-09: qty 15

## 2018-10-09 MED ORDER — DARBEPOETIN ALFA 200 MCG/0.4ML IJ SOSY: 200.0000 ug | PREFILLED_SYRINGE | INTRAMUSCULAR | Status: DC

## 2018-10-09 MED ORDER — ACETAMINOPHEN 325 MG PO TABS
650.0000 mg | ORAL_TABLET | Freq: Four times a day (QID) | ORAL | Status: DC | PRN
  Administered 2018-10-10 – 2018-10-13 (×4): 650 mg via ORAL
  Filled 2018-10-09 (×4): qty 2

## 2018-10-09 MED ORDER — GLYCOPYRROLATE 0.2 MG/ML IJ SOLN: 0.2000 mg | INTRAMUSCULAR | Status: DC | PRN

## 2018-10-09 NOTE — Progress Notes (Addendum)
  Speech Language Pathology Treatment: Dysphagia  Patient Details Name: [name] MRN: [id number] DOB: April 28, 1941 Today's Date: 10/09/2018 Time: 6314-9702 SLP Time Calculation (min) (ACUTE ONLY): 30 min  Assessment / Plan / Recommendation Clinical Impression  Pt demonstrating improved status since 10/07/18. Pt not crying out today, following simple directions, requesting water. SLP assisted pt with lunch of puree solids and thin liquids. Pt appears to tolerate current diet with slight oral residue noted, but no overt s/s aspiration. Safe swallow precautions are posted at [facility]. Given diagnosis of dementia, pt appropriateness for safe po intake is likely to be highly variable with changes in mentation. Recommend continuing with current diet, providing supervised po when pt is alert and appropriate. ST goals have been met. Will sign off at this time. Please reconsult if needs arise.    HPI HPI: [name] is a 78 y.o. female with medical history significant for end-stage renal disease on hemodialysis Monday Wednesday Friday, multiple myeloma, dementia, hypertension, chronic diastolic CHF, chronic hyponatremia, ambulatory dysfunction wheelchair-bound who presented to [facility] ED from SNF due to sudden change in behavior last night.  Patient is unable to provide a history due to altered mental status.  History is obtained from her nursing facility and from medical records.  Per RN at SNF around 11 PM last night patient became suddenly confused, pulling out her IVs being uncooperative and aggressive.  No recent changes in her medications.  Blood pressure was excessively elevated.  Patient was brought to the ED for further evaluation.  Per RN patient has had intermittent change in behavior in the last few months but just not as severe.  Patient has been refusing hemodialysis.  Last hemodialysis was last Wednesday.  Recently discharged on IV Zosyn for Achromobacter bacteremia.  She was found to have acute metabolic  encephalopathy secondary to hypertensive urgency vs MSSA bacteremia.  She was also found to be hyponatremic.  CT head is negative for acute findings.        SLP Plan  Discharge SLP treatment due to goals met      Recommendations  Diet recommendations: Dysphagia 1 (puree);Thin liquid Liquids provided via: Straw;Cup Medication Administration: (as tolerated) Supervision: Full supervision/cueing for compensatory strategies Compensations: Minimize environmental distractions;Slow rate;Small sips/bites;Follow solids with liquid Postural Changes and/or Swallow Maneuvers: Seated upright 90 degrees;Upright 30-60 min after meal                Oral Care Recommendations: Oral care BID Follow up Recommendations: Skilled Nursing facility SLP Visit Diagnosis: Dysphagia, oral phase (R13.11) Plan: Discharge SLP treatment due to (comment)       [place] [address] [place] [address] [facility], [place] Speech Language Pathologist [phone]  Shonna Chock 10/09/2018, 1:14 PM

## 2018-10-09 NOTE — Progress Notes (Signed)
Long discussion with patient's daughter over the phone-explained very poor overall prognosis-recurrent bacteremias in spite of IV antimicrobial therapy.  Continues to be very severely altered-worsening anemia and leukopenia.  We extensively talked about her very poor quality of life-and her extremely poor prognosis.  Explained to the patient's daughter that nephrology is recommending that we transition to full comfort measures and stop dialysis.  Extensive discussion-patient's daughter was very understanding and agreed to stop hemodialysis and to transition to comfort measures.  We also talked about transitioning her to residential hospice care.  Total time spent on the phone almost 30 minutes

## 2018-10-09 NOTE — Progress Notes (Signed)
CRITICAL VALUE ALERT  Critical Value:  Hgb 5.9   Date & Time Notied:  0519  Provider Notified: Baltazar Najjar, NP  Orders Received/Actions taken: On Call paged and notified

## 2018-10-09 NOTE — Progress Notes (Signed)
[facility] for Infectious Disease  Date of Admission:  10/03/2018     Total days of antibiotics 7         ASSESSMENT/PLAN  [name] continues to receive treatment for MSSA and recurrent Achromobacter bacteremia with her dialysis catheter being the most likely primary source of infection. Echocardiography has been deferred at this time, so endocarditis cannot be fully excluded. With new onset back pain could consider imaging to rule out discitis/osteomyelitis. She also has a significant anemia now at 5.9.  Depending upon goals of care and treatment would continue IV antibiotic therapy with dialysis for 4 weeks with cefazolin and ceftazidime.  1. Continue ceftazidime and cefazolin with dialysis with end date of 11/03/18.  2. Continuation of care/goals of care per primary team and palliative care. 3. Anemia per nephrology and primary team.    ID will sign off and be available as needed for the duration of her hospitalization.    Principal Problem:   Bacteremia Active Problems:   Hypertension associatd with end stage renal disease on dialysis   . sodium chloride   Intravenous Once  . amLODipine  10 mg Oral QPM  . calcium acetate  1,334 mg Oral TID WC  . Chlorhexidine Gluconate Cloth  6 each Topical Q0600  . Chlorhexidine Gluconate Cloth  6 each Topical Q0600  . cloNIDine  0.2 mg Transdermal Weekly  . doxercalciferol  2 mcg Intravenous Q T,Th,Sa-HD  . feeding supplement (PRO-STAT SUGAR FREE 64)  30 mL Oral BID  . heparin injection (subcutaneous)  5,000 Units Subcutaneous Q8H  . isosorbide mononitrate  60 mg Oral Daily  . lamoTRIgine  50 mg Oral Daily  . latanoprost  1 drop Both Eyes QHS  . LORazepam  0.5 mg Oral QHS  . losartan  50 mg Oral QPM  . mouth rinse  15 mL Mouth Rinse BID  . multivitamin  1 tablet Oral Daily  . valACYclovir  500 mg Oral QODAY    SUBJECTIVE:  Mildly increased temperature of 100 in the last 24 hours with stable WBC count of 3.3.  Hemoglobin has  decreased to 5.9 from 7.0. [name] does refuse blood products. No acute events noted overnight.   Asking why the catheter is so heavy. Having pain now located in the middle of her back. No new trauma or injury.   Allergies  Allergen Reactions  . Phenergan [Promethazine Hcl] Other (See Comments)    Acute encephalopathy in the context of refusal to go to hemodialysis and administration of Phenergan for nausea and vomiting     Review of Systems: Review of Systems  Constitutional: Negative for chills, diaphoresis and fever.  Respiratory: Negative for cough, shortness of breath and wheezing.   Cardiovascular: Negative for chest pain and leg swelling.  Gastrointestinal: Negative for abdominal pain, diarrhea, nausea and vomiting.  Musculoskeletal: Positive for back pain.  Skin: Negative for rash.  Neurological: Negative for headaches.      OBJECTIVE: Vitals:   10/08/18 0922 10/08/18 1345 10/08/18 2149 10/09/18 0552  BP: 132/89 131/78 106/76 104/76  Pulse: 65 64 (!) 55 (!) 48  Resp:  16 19   Temp: 100 F (37.8 C) 100 F (37.8 C) 99.3 F (37.4 C) 99.3 F (37.4 C)  TempSrc: Oral Oral Oral Oral  SpO2: 100% 98% 100% 100%  Weight:      Height:       Body mass index is 21.71 kg/m.  Physical Exam Constitutional:  General: She is not in acute distress.    Appearance: She is well-developed.  Cardiovascular:     Rate and Rhythm: Normal rate and regular rhythm.     Heart sounds: Normal heart sounds.  Pulmonary:     Effort: Pulmonary effort is normal.     Breath sounds: Normal breath sounds.  Abdominal:     General: Abdomen is flat.     Palpations: Abdomen is soft.  Musculoskeletal:     Comments: Back with no obvious deformity, discoloration or edema.   Skin:    General: Skin is warm and dry.  Neurological:     Mental Status: She is alert.     Lab Results Lab Results  Component Value Date   WBC 3.3 (L) 10/09/2018   HGB 5.9 (LL) 10/09/2018   HCT 20.4 (L)  10/09/2018   MCV 106.3 (H) 10/09/2018   PLT 183 10/09/2018    Lab Results  Component Value Date   CREATININE 5.56 (H) 10/09/2018   BUN 38 (H) 10/09/2018   NA 131 (L) 10/09/2018   K 4.1 10/09/2018   CL 97 (L) 10/09/2018   CO2 28 10/09/2018    Lab Results  Component Value Date   ALT 8 10/03/2018   AST 18 10/03/2018   ALKPHOS 49 10/03/2018   BILITOT 0.2 (L) 10/03/2018     Microbiology: Recent Results (from the past 240 hour(s))  Blood culture (routine x 2)     Status: Abnormal   Collection Time: 10/03/18  7:30 AM  Result Value Ref Range Status   Specimen Description BLOOD RIGHT HAND  Final   Special Requests   Final    BOTTLES DRAWN AEROBIC AND ANAEROBIC Blood Culture adequate volume Performed at [facility], [place] [address], [place], [place] [address]    Culture  Setup Time   Final    GRAM NEGATIVE RODS AEROBIC BOTTLE ONLY CRITICAL RESULT CALLED TO, READ BACK BY AND VERIFIED WITH: PHARMD BEN MANCHERIL [id number] FCP    Culture ACHROMOBACTER XYLOSOXIDANS (A)  Final   Report Status 10/06/2018 FINAL  Final   Organism ID, Bacteria ACHROMOBACTER XYLOSOXIDANS  Final      Susceptibility   Achromobacter xylosoxidans - MIC*    CEFEPIME 16 INTERMEDIATE Intermediate     CEFAZOLIN >=64 RESISTANT Resistant     GENTAMICIN >=16 RESISTANT Resistant     CIPROFLOXACIN >=4 RESISTANT Resistant     IMIPENEM 8 INTERMEDIATE Intermediate     TRIMETH/SULFA <=20 SENSITIVE Sensitive     CEFTAZIDIME Value in next row Sensitive      SENSITIVE4    PIP/TAZO Value in next row Sensitive      SENSITIVE<=4    * ACHROMOBACTER XYLOSOXIDANS  Blood Culture ID Panel (Reflexed)     Status: None   Collection Time: 10/03/18  7:31 AM  Result Value Ref Range Status   Enterococcus species NOT DETECTED NOT DETECTED Final   Listeria monocytogenes NOT DETECTED NOT DETECTED Final   Staphylococcus species NOT DETECTED NOT DETECTED Final   Staphylococcus aureus (BCID) NOT DETECTED NOT DETECTED Final    Streptococcus species NOT DETECTED NOT DETECTED Final   Streptococcus agalactiae NOT DETECTED NOT DETECTED Final   Streptococcus pneumoniae NOT DETECTED NOT DETECTED Final   Streptococcus pyogenes NOT DETECTED NOT DETECTED Final   Acinetobacter baumannii NOT DETECTED NOT DETECTED Final   Enterobacteriaceae species NOT DETECTED NOT DETECTED Final   Enterobacter cloacae complex NOT DETECTED NOT DETECTED Final   Escherichia coli NOT DETECTED NOT DETECTED  Final   Klebsiella oxytoca NOT DETECTED NOT DETECTED Final   Klebsiella pneumoniae NOT DETECTED NOT DETECTED Final   Proteus species NOT DETECTED NOT DETECTED Final   Serratia marcescens NOT DETECTED NOT DETECTED Final   Haemophilus influenzae NOT DETECTED NOT DETECTED Final   Neisseria meningitidis NOT DETECTED NOT DETECTED Final   Pseudomonas aeruginosa NOT DETECTED NOT DETECTED Final   Candida albicans NOT DETECTED NOT DETECTED Final   Candida glabrata NOT DETECTED NOT DETECTED Final   Candida krusei NOT DETECTED NOT DETECTED Final   Candida parapsilosis NOT DETECTED NOT DETECTED Final   Candida tropicalis NOT DETECTED NOT DETECTED Final  Blood culture (routine x 2)     Status: Abnormal   Collection Time: 10/03/18  7:33 AM  Result Value Ref Range Status   Specimen Description BLOOD RIGHT ANTECUBITAL  Final   Special Requests   Final    BOTTLES DRAWN AEROBIC AND ANAEROBIC Blood Culture results may not be optimal due to an inadequate volume of blood received in culture bottles   Culture  Setup Time   Final    GRAM POSITIVE COCCI AEROBIC BOTTLE ONLY Organism ID to follow CRITICAL RESULT CALLED TO, READ BACK BY AND VERIFIED WITHKarsten Ro PHARMD 1610 10/04/18 A BROWNING Performed at [facility], [address] [place], [place] [address]    Culture STAPHYLOCOCCUS AUREUS (A)  Final   Report Status 10/06/2018 FINAL  Final   Organism ID, Bacteria STAPHYLOCOCCUS AUREUS  Final      Susceptibility   Staphylococcus aureus - MIC*     CIPROFLOXACIN <=0.5 SENSITIVE Sensitive     ERYTHROMYCIN <=0.25 SENSITIVE Sensitive     GENTAMICIN <=0.5 SENSITIVE Sensitive     OXACILLIN 0.5 SENSITIVE Sensitive     TETRACYCLINE <=1 SENSITIVE Sensitive     VANCOMYCIN <=0.5 SENSITIVE Sensitive     TRIMETH/SULFA <=10 SENSITIVE Sensitive     CLINDAMYCIN <=0.25 SENSITIVE Sensitive     RIFAMPIN <=0.5 SENSITIVE Sensitive     Inducible Clindamycin NEGATIVE Sensitive     * STAPHYLOCOCCUS AUREUS  Blood Culture ID Panel (Reflexed)     Status: Abnormal   Collection Time: 10/03/18  7:33 AM  Result Value Ref Range Status   Enterococcus species NOT DETECTED NOT DETECTED Final   Listeria monocytogenes NOT DETECTED NOT DETECTED Final   Staphylococcus species DETECTED (A) NOT DETECTED Final    Comment: CRITICAL RESULT CALLED TO, READ BACK BY AND VERIFIED WITH: J [employer] PHARMD 0240 10/04/18 A BROWNING    Staphylococcus aureus (BCID) DETECTED (A) NOT DETECTED Final    Comment: Methicillin (oxacillin) susceptible Staphylococcus aureus (MSSA). Preferred therapy is anti staphylococcal beta lactam antibiotic (Cefazolin or Nafcillin), unless clinically contraindicated. CRITICAL RESULT CALLED TO, READ BACK BY AND VERIFIED WITH: Karsten Ro PHARMD 0240 10/04/18 A BROWNING    Methicillin resistance NOT DETECTED NOT DETECTED Final   Streptococcus species NOT DETECTED NOT DETECTED Final   Streptococcus agalactiae NOT DETECTED NOT DETECTED Final   Streptococcus pneumoniae NOT DETECTED NOT DETECTED Final   Streptococcus pyogenes NOT DETECTED NOT DETECTED Final   Acinetobacter baumannii NOT DETECTED NOT DETECTED Final   Enterobacteriaceae species NOT DETECTED NOT DETECTED Final   Enterobacter cloacae complex NOT DETECTED NOT DETECTED Final   Escherichia coli NOT DETECTED NOT DETECTED Final   Klebsiella oxytoca NOT DETECTED NOT DETECTED Final   Klebsiella pneumoniae NOT DETECTED NOT DETECTED Final   Proteus species NOT DETECTED NOT DETECTED Final   Serratia  marcescens NOT DETECTED NOT DETECTED Final  Haemophilus influenzae NOT DETECTED NOT DETECTED Final   Neisseria meningitidis NOT DETECTED NOT DETECTED Final   Pseudomonas aeruginosa NOT DETECTED NOT DETECTED Final   Candida albicans NOT DETECTED NOT DETECTED Final   Candida glabrata NOT DETECTED NOT DETECTED Final   Candida krusei NOT DETECTED NOT DETECTED Final   Candida parapsilosis NOT DETECTED NOT DETECTED Final   Candida tropicalis NOT DETECTED NOT DETECTED Final    Comment: Performed at [facility], [place] [address], [place], [place] [address]  MRSA PCR Screening     Status: None   Collection Time: 10/03/18  6:46 PM  Result Value Ref Range Status   MRSA by PCR NEGATIVE NEGATIVE Final    Comment:        The GeneXpert MRSA Assay (FDA approved for NASAL specimens only), is one component of a comprehensive MRSA colonization surveillance program. It is not intended to diagnose MRSA infection nor to guide or monitor treatment for MRSA infections. Performed at [facility], [place] [address] [place], [place] [address]   Culture, blood (Routine X 2) w Reflex to ID Panel     Status: None   Collection Time: 10/04/18  1:18 PM  Result Value Ref Range Status   Specimen Description BLOOD BLOOD LEFT HAND  Final   Special Requests AEROBIC BOTTLE ONLY Blood Culture adequate volume  Final   Culture   Final    NO GROWTH 5 DAYS Performed at [facility], [place] [address] [place], [place] [address]    Report Status 10/09/2018 FINAL  Final  Culture, blood (routine x 2)     Status: None (Preliminary result)   Collection Time: 10/06/18 11:13 AM  Result Value Ref Range Status   Specimen Description BLOOD RIGHT FOREARM  Final   Special Requests   Final    BOTTLES DRAWN AEROBIC AND ANAEROBIC Blood Culture adequate volume   Culture   Final    NO GROWTH 3 DAYS Performed at [facility], [place] [address] [place], [place] [address]    Report Status PENDING  Incomplete    Culture, blood (routine x 2)     Status: None (Preliminary result)   Collection Time: 10/06/18 11:18 AM  Result Value Ref Range Status   Specimen Description BLOOD RIGHT HAND  Final   Special Requests   Final    BOTTLES DRAWN AEROBIC AND ANAEROBIC Blood Culture adequate volume   Culture   Final    NO GROWTH 3 DAYS Performed at [facility], [place] [address] [place],  [address]    Report Status PENDING  Incomplete     Terri Piedra, [place] for [facility] [phone] Pager  10/09/2018  10:45 AM

## 2018-10-09 NOTE — Progress Notes (Signed)
[facility])  Received request from [place], [place],  for family interest in [facility]. Chart reviewed and spoke with daughter [name] acknowledge referral.  Unfortunately [facility] is not able to offer a room today.  [name] and LCSW are aware [facility] liaison will follow up with LCSW and [name] tomorrow or sooner if room becomes available.    Please do not hesitate to call with questions.   Thank you for this referral, Venia Carbon BSN, RN [facility] Liaison (listed in [place]) [phone]

## 2018-10-09 NOTE — Progress Notes (Signed)
PROGRESS NOTE        PATIENT DETAILS Name: [name] Age: 78 y.o. Sex: female Date of Birth: 02-15-1941 Admit Date: 10/03/2018 Admitting Physician Kayleen Memos, DO ZOX:WRUEAV, Darrick Penna, MD  Brief Narrative: Patient is a 78 y.o. female history of ESRD on HD TTS, multiple myeloma, dementia, chronic diastolic heart failure, chronic hyponatremia-ambulatory dysfunction-bed to wheelchair bound-presented with worsening confusion-thought to be secondary to hypertensive encephalopathy and recurrent bacteremia.  See below for further details  Subjective: Confused-barely answering questions appropriately  Assessment/Plan: Acute metabolic encephalopathy: Although patient has significant amount of dementia at baseline-encephalopathy is thought to be metabolic in nature-likely from bacteremia and hypertensive encephalopathy.  CT head on 1/4- for acute abnormalities.  She continues to be very confused without any significant improvement.  After extensive discussion with the patient's daughter over the phone-have transition her to full comfort measures.  MSSA/Achromobacter bacteremia: ID following-treated with cefazolin and ceftazidime-after discussion with the patient's daughter over the phone-have transition her to comfort measures-have stopped IV antimicrobial therapy.  Hypertensive emergency: will go ahead and stop all antihypertensives as patient has been transition to full comfort measures.   Anemia: Secondary to CKD/ESRD/multiple myeloma-anemia continues to worsen-since she has been transition to full comfort measures-we will defer transfusion at this point.    ESRD: Nephrology following-underwent HD per her usual schedule (TTS)-given very poor overall prognosis-poor quality of life-nephrology recommended that we stop HD if family agreeable.  Subsequently this MD had a long discussion with the patient's daughter over the phone-after extensive discussion-we have  transitioned patient to full comfort measures.  Patient will no longer undergo HD.  Nephrologist updated.  Chronic diastolic heart failure: Volume management was with HD.  Chronic hyponatremia  Dysphagia: Seen by speech therapy-continue dysphagia 1 diet with full aspiration precautions.  Moderate pulmonary hypertension: Supportive care  Failure to thrive syndrome/debility/deconditioning/palliative care: Very poor overall prognosis-mostly bed to wheelchair bound-has significant worsening of her chronic debility/deconditioning.  Very poor overall quality of life.  After extensive discussion with the patient's daughter over the phone this afternoon-we will transition her to full comfort measures-no further antimicrobial therapy will be continued, no further HD.  Family agreeable to transition to residential hospice.  Social work consulted.    DVT Prophylaxis: None  Code Status: DNR  Family Communication: Daughter over the phone  Disposition Plan: Remain inpatient-residential hospice on discharge  Antimicrobial agents: Anti-infectives (From admission, onward)   Start     Dose/Rate Route Frequency Ordered Stop   10/07/18 1200  ceFAZolin (ANCEF) IVPB 2g/100 mL premix  Status:  Discontinued     2 g 200 mL/hr over 30 Minutes Intravenous Every T-Th-Sa (Hemodialysis) 10/06/18 1004 10/09/18 1422   10/07/18 1200  cefTAZidime (FORTAZ) 2 g in sodium chloride 0.9 % 100 mL IVPB  Status:  Discontinued     2 g 200 mL/hr over 30 Minutes Intravenous Every T-Th-Sa (Hemodialysis) 10/06/18 1004 10/09/18 1422   10/06/18 1015  cefTAZidime (FORTAZ) 2 g in sodium chloride 0.9 % 100 mL IVPB     2 g 200 mL/hr over 30 Minutes Intravenous  Once 10/06/18 1004 10/06/18 1222   10/04/18 2000  ceFEPIme (MAXIPIME) 1 g in sodium chloride 0.9 % 100 mL IVPB  Status:  Discontinued     1 g 200 mL/hr over 30 Minutes Intravenous Every 24 hours 10/04/18 1829 10/06/18 1004   10/04/18  1800  ceFAZolin (ANCEF) IVPB 2g/100 mL  premix  Status:  Discontinued     2 g 200 mL/hr over 30 Minutes Intravenous Every T-Th-Sa (1800) 10/04/18 1313 10/04/18 1829   10/03/18 1400  valACYclovir (VALTREX) tablet 500 mg  Status:  Discontinued     500 mg Oral Every other day 10/03/18 0852 10/09/18 1422   10/03/18 1330  piperacillin-tazobactam (ZOSYN) IVPB 3.375 g  Status:  Discontinued     3.375 g 12.5 mL/hr over 240 Minutes Intravenous Every 12 hours 10/03/18 1317 10/04/18 1255   10/03/18 1315  piperacillin-tazobactam (ZOSYN) injection 2.25 g  Status:  Discontinued     2.25 g Intramuscular Every 12 hours 10/03/18 1305 10/03/18 1316      Procedures: None  CONSULTS:  nephrology and Palliative care, ID  Time spent: 45 minutes-Greater than 50% of this time was spent in counseling, explanation of diagnosis, planning of further management, and coordination of care.  MEDICATIONS: Scheduled Meds: . lamoTRIgine  50 mg Oral Daily  . latanoprost  1 drop Both Eyes QHS   Continuous Infusions:  PRN Meds:.acetaminophen **OR** acetaminophen, antiseptic oral rinse, glycopyrrolate **OR** glycopyrrolate **OR** glycopyrrolate, hydrALAZINE, HYDROmorphone (DILAUDID) injection, LORazepam **OR** LORazepam **OR** LORazepam, LORazepam, polyvinyl alcohol   PHYSICAL EXAM: Vital signs: Vitals:   10/08/18 0922 10/08/18 1345 10/08/18 2149 10/09/18 0552  BP: 132/89 131/78 106/76 104/76  Pulse: 65 64 (!) 55 (!) 48  Resp:  16 19   Temp: 100 F (37.8 C) 100 F (37.8 C) 99.3 F (37.4 C) 99.3 F (37.4 C)  TempSrc: Oral Oral Oral Oral  SpO2: 100% 98% 100% 100%  Weight:      Height:       Filed Weights   10/04/18 0745 10/04/18 1115 10/07/18 1155  Weight: 57 kg 54.9 kg 61 kg   Body mass index is 21.71 kg/m.   General appearance:Awake, but very confused Eyes:no scleral icterus. HEENT: Atraumatic and Normocephalic Neck: supple, no JVD. Resp:Good air entry bilaterally, clear to auscultation anteriorly CVS: S1 S2 regular, no murmurs.  GI:  Bowel sounds present, Non tender and not distended with no gaurding, rigidity or rebound. Extremities: B/L Lower Ext shows no edema, both legs are warm to touch Neurology:  Non focal but difficult exam given mental status Musculoskeletal:No digital cyanosis Skin:No Rash, warm and dry Wounds:N/A  I have personally reviewed following labs and imaging studies  LABORATORY DATA: CBC: Recent Labs  Lab 10/05/18 0300 10/05/18 1359 10/06/18 0349 10/07/18 0540 10/08/18 0423 10/09/18 0401  WBC 3.9*  --  4.0 5.3 3.6* 3.3*  NEUTROABS 2.7  --  2.6 4.0 2.6 2.5  HGB 7.5*  --  7.0* 8.3* 7.0* 5.9*  HCT 25.3* 21.9* 23.6* 27.6* 23.2* 20.4*  MCV 104.5*  --  105.8* 105.7* 104.0* 106.3*  PLT 164  --  179 232 189 597    Basic Metabolic Panel: Recent Labs  Lab 10/05/18 0300 10/06/18 0349 10/07/18 0540 10/08/18 0423 10/09/18 0401  NA 131* 129* 129* 129* 131*  K 3.7 3.8 4.0 3.7 4.1  CL 97* 96* 95* 96* 97*  CO2 28 25 21* 27 28  GLUCOSE 71 81 95 76 75  BUN 19 35* 55* 22 38*  CREATININE 5.01* 5.95* 7.39* 3.96* 5.56*  CALCIUM 8.4* 8.7* 9.5 8.7* 9.1  PHOS  --   --  3.0  --  3.4    GFR: Estimated Creatinine Clearance: 7.7 mL/min (A) (by C-G formula based on SCr of 5.56 mg/dL (H)).  Liver Function Tests: Recent  Labs  Lab 10/03/18 0325 10/09/18 0401  AST 18  --   ALT 8  --   ALKPHOS 49  --   BILITOT 0.2*  --   PROT 9.3*  --   ALBUMIN 2.2* 1.7*   No results for input(s): LIPASE, AMYLASE in the last 168 hours. Recent Labs  Lab 10/03/18 0326  AMMONIA 19    Coagulation Profile: No results for input(s): INR, PROTIME in the last 168 hours.  Cardiac Enzymes: No results for input(s): CKTOTAL, CKMB, CKMBINDEX, TROPONINI in the last 168 hours.  BNP (last 3 results) No results for input(s): PROBNP in the last 8760 hours.  HbA1C: No results for input(s): HGBA1C in the last 72 hours.  CBG: Recent Labs  Lab 10/03/18 0347  GLUCAP 83    Lipid Profile: No results for input(s):  CHOL, HDL, LDLCALC, TRIG, CHOLHDL, LDLDIRECT in the last 72 hours.  Thyroid Function Tests: No results for input(s): TSH, T4TOTAL, FREET4, T3FREE, THYROIDAB in the last 72 hours.  Anemia Panel: No results for input(s): VITAMINB12, FOLATE, FERRITIN, TIBC, IRON, RETICCTPCT in the last 72 hours.  Urine analysis:    Component Value Date/Time   COLORURINE STRAW (A) 09/16/2018 0326   APPEARANCEUR CLEAR 09/16/2018 0326   LABSPEC 1.010 09/16/2018 0326   PHURINE 9.0 (H) 09/16/2018 0326   GLUCOSEU 50 (A) 09/16/2018 0326   HGBUR NEGATIVE 09/16/2018 0326   BILIRUBINUR NEGATIVE 09/16/2018 0326   KETONESUR 5 (A) 09/16/2018 0326   PROTEINUR 100 (A) 09/16/2018 0326   UROBILINOGEN 0.2 04/10/2015 1800   NITRITE NEGATIVE 09/16/2018 0326   LEUKOCYTESUR NEGATIVE 09/16/2018 0326    Sepsis Labs: Lactic Acid, Venous    Component Value Date/Time   LATICACIDVEN 1.31 09/16/2018 0334    MICROBIOLOGY: Recent Results (from the past 240 hour(s))  Blood culture (routine x 2)     Status: Abnormal   Collection Time: 10/03/18  7:30 AM  Result Value Ref Range Status   Specimen Description BLOOD RIGHT HAND  Final   Special Requests   Final    BOTTLES DRAWN AEROBIC AND ANAEROBIC Blood Culture adequate volume Performed at [facility], [place] [address], [place], [place] [address]    Culture  Setup Time   Final    GRAM NEGATIVE RODS AEROBIC BOTTLE ONLY CRITICAL RESULT CALLED TO, READ BACK BY AND VERIFIED WITH: PHARMD BEN MANCHERIL [id number] FCP    Culture ACHROMOBACTER XYLOSOXIDANS (A)  Final   Report Status 10/06/2018 FINAL  Final   Organism ID, Bacteria ACHROMOBACTER XYLOSOXIDANS  Final      Susceptibility   Achromobacter xylosoxidans - MIC*    CEFEPIME 16 INTERMEDIATE Intermediate     CEFAZOLIN >=64 RESISTANT Resistant     GENTAMICIN >=16 RESISTANT Resistant     CIPROFLOXACIN >=4 RESISTANT Resistant     IMIPENEM 8 INTERMEDIATE Intermediate     TRIMETH/SULFA <=20 SENSITIVE Sensitive      CEFTAZIDIME Value in next row Sensitive      SENSITIVE4    PIP/TAZO Value in next row Sensitive      SENSITIVE<=4    * ACHROMOBACTER XYLOSOXIDANS  Blood Culture ID Panel (Reflexed)     Status: None   Collection Time: 10/03/18  7:31 AM  Result Value Ref Range Status   Enterococcus species NOT DETECTED NOT DETECTED Final   Listeria monocytogenes NOT DETECTED NOT DETECTED Final   Staphylococcus species NOT DETECTED NOT DETECTED Final   Staphylococcus aureus (BCID) NOT DETECTED NOT DETECTED Final   Streptococcus species NOT  DETECTED NOT DETECTED Final   Streptococcus agalactiae NOT DETECTED NOT DETECTED Final   Streptococcus pneumoniae NOT DETECTED NOT DETECTED Final   Streptococcus pyogenes NOT DETECTED NOT DETECTED Final   Acinetobacter baumannii NOT DETECTED NOT DETECTED Final   Enterobacteriaceae species NOT DETECTED NOT DETECTED Final   Enterobacter cloacae complex NOT DETECTED NOT DETECTED Final   Escherichia coli NOT DETECTED NOT DETECTED Final   Klebsiella oxytoca NOT DETECTED NOT DETECTED Final   Klebsiella pneumoniae NOT DETECTED NOT DETECTED Final   Proteus species NOT DETECTED NOT DETECTED Final   Serratia marcescens NOT DETECTED NOT DETECTED Final   Haemophilus influenzae NOT DETECTED NOT DETECTED Final   Neisseria meningitidis NOT DETECTED NOT DETECTED Final   Pseudomonas aeruginosa NOT DETECTED NOT DETECTED Final   Candida albicans NOT DETECTED NOT DETECTED Final   Candida glabrata NOT DETECTED NOT DETECTED Final   Candida krusei NOT DETECTED NOT DETECTED Final   Candida parapsilosis NOT DETECTED NOT DETECTED Final   Candida tropicalis NOT DETECTED NOT DETECTED Final  Blood culture (routine x 2)     Status: Abnormal   Collection Time: 10/03/18  7:33 AM  Result Value Ref Range Status   Specimen Description BLOOD RIGHT ANTECUBITAL  Final   Special Requests   Final    BOTTLES DRAWN AEROBIC AND ANAEROBIC Blood Culture results may not be optimal due to an inadequate volume  of blood received in culture bottles   Culture  Setup Time   Final    GRAM POSITIVE COCCI AEROBIC BOTTLE ONLY Organism ID to follow CRITICAL RESULT CALLED TO, READ BACK BY AND VERIFIED WITHKarsten Ro PHARMD [id number] 10/04/18 A BROWNING Performed at [facility], [address] [place], [place] [address]    Culture STAPHYLOCOCCUS AUREUS (A)  Final   Report Status 10/06/2018 FINAL  Final   Organism ID, Bacteria STAPHYLOCOCCUS AUREUS  Final      Susceptibility   Staphylococcus aureus - MIC*    CIPROFLOXACIN <=0.5 SENSITIVE Sensitive     ERYTHROMYCIN <=0.25 SENSITIVE Sensitive     GENTAMICIN <=0.5 SENSITIVE Sensitive     OXACILLIN 0.5 SENSITIVE Sensitive     TETRACYCLINE <=1 SENSITIVE Sensitive     VANCOMYCIN <=0.5 SENSITIVE Sensitive     TRIMETH/SULFA <=10 SENSITIVE Sensitive     CLINDAMYCIN <=0.25 SENSITIVE Sensitive     RIFAMPIN <=0.5 SENSITIVE Sensitive     Inducible Clindamycin NEGATIVE Sensitive     * STAPHYLOCOCCUS AUREUS  Blood Culture ID Panel (Reflexed)     Status: Abnormal   Collection Time: 10/03/18  7:33 AM  Result Value Ref Range Status   Enterococcus species NOT DETECTED NOT DETECTED Final   Listeria monocytogenes NOT DETECTED NOT DETECTED Final   Staphylococcus species DETECTED (A) NOT DETECTED Final    Comment: CRITICAL RESULT CALLED TO, READ BACK BY AND VERIFIED WITH: [facility] PHARMD 0240 10/04/18 A BROWNING    Staphylococcus aureus (BCID) DETECTED (A) NOT DETECTED Final    Comment: Methicillin (oxacillin) susceptible Staphylococcus aureus (MSSA). Preferred therapy is anti staphylococcal beta lactam antibiotic (Cefazolin or Nafcillin), unless clinically contraindicated. CRITICAL RESULT CALLED TO, READ BACK BY AND VERIFIED WITH: J [facility]ORD PHARMD 0240 10/04/18 A BROWNING    Methicillin resistance NOT DETECTED NOT DETECTED Final   Streptococcus species NOT DETECTED NOT DETECTED Final   Streptococcus agalactiae NOT DETECTED NOT DETECTED Final   Streptococcus  pneumoniae NOT DETECTED NOT DETECTED Final   Streptococcus pyogenes NOT DETECTED NOT DETECTED Final   Acinetobacter baumannii NOT DETECTED  NOT DETECTED Final   Enterobacteriaceae species NOT DETECTED NOT DETECTED Final   Enterobacter cloacae complex NOT DETECTED NOT DETECTED Final   Escherichia coli NOT DETECTED NOT DETECTED Final   Klebsiella oxytoca NOT DETECTED NOT DETECTED Final   Klebsiella pneumoniae NOT DETECTED NOT DETECTED Final   Proteus species NOT DETECTED NOT DETECTED Final   Serratia marcescens NOT DETECTED NOT DETECTED Final   Haemophilus influenzae NOT DETECTED NOT DETECTED Final   Neisseria meningitidis NOT DETECTED NOT DETECTED Final   Pseudomonas aeruginosa NOT DETECTED NOT DETECTED Final   Candida albicans NOT DETECTED NOT DETECTED Final   Candida glabrata NOT DETECTED NOT DETECTED Final   Candida krusei NOT DETECTED NOT DETECTED Final   Candida parapsilosis NOT DETECTED NOT DETECTED Final   Candida tropicalis NOT DETECTED NOT DETECTED Final    Comment: Performed at [facility], [place] [address] [place], [place] [address]  MRSA PCR Screening     Status: None   Collection Time: 10/03/18  6:46 PM  Result Value Ref Range Status   MRSA by PCR NEGATIVE NEGATIVE Final    Comment:        The GeneXpert MRSA Assay (FDA approved for NASAL specimens only), is one component of a comprehensive MRSA colonization surveillance program. It is not intended to diagnose MRSA infection nor to guide or monitor treatment for MRSA infections. Performed at [facility], [place] [address], [place], [place] [address]   Culture, blood (Routine X 2) w Reflex to ID Panel     Status: None   Collection Time: 10/04/18  1:18 PM  Result Value Ref Range Status   Specimen Description BLOOD BLOOD LEFT HAND  Final   Special Requests AEROBIC BOTTLE ONLY Blood Culture adequate volume  Final   Culture   Final    NO GROWTH 5 DAYS Performed at [facility], [place] [address]  [place], [place] [address]    Report Status 10/09/2018 FINAL  Final  Culture, blood (routine x 2)     Status: None (Preliminary result)   Collection Time: 10/06/18 11:13 AM  Result Value Ref Range Status   Specimen Description BLOOD RIGHT FOREARM  Final   Special Requests   Final    BOTTLES DRAWN AEROBIC AND ANAEROBIC Blood Culture adequate volume   Culture   Final    NO GROWTH 3 DAYS Performed at [facility], [place] [address], [place], [place] [address]    Report Status PENDING  Incomplete  Culture, blood (routine x 2)     Status: None (Preliminary result)   Collection Time: 10/06/18 11:18 AM  Result Value Ref Range Status   Specimen Description BLOOD RIGHT HAND  Final   Special Requests   Final    BOTTLES DRAWN AEROBIC AND ANAEROBIC Blood Culture adequate volume   Culture   Final    NO GROWTH 3 DAYS Performed at [facility], [place] [address], [place], [place] [address]    Report Status PENDING  Incomplete    RADIOLOGY STUDIES/RESULTS: Ct Head Wo Contrast  Result Date: 10/04/2018 CLINICAL DATA:  Head trauma, ataxia EXAM: CT HEAD WITHOUT CONTRAST CT CERVICAL SPINE WITHOUT CONTRAST TECHNIQUE: Multidetector CT imaging of the head and cervical spine was performed following the standard protocol without intravenous contrast. Multiplanar CT image reconstructions of the cervical spine were also generated. COMPARISON:  Head CT yesterday at 0455 hour, brain MRI 09/16/2018. Cervical spine CT 07/26/2018 FINDINGS: CT HEAD FINDINGS Brain: No intracranial hemorrhage,  mass effect, or midline shift. No hydrocephalus. The basilar cisterns are patent. No evidence of territorial infarct or acute ischemia. No extra-axial or intracranial fluid collection. Stable degree of atrophy and chronic small vessel ischemia. Remote left cerebellar infarct. Vascular: Atherosclerosis of skullbase vasculature without hyperdense vessel or abnormal calcification. Skull: Innumerable skull lesions consistent with  patient's history of myeloma, unchanged. No skull fracture. Sinuses/Orbits: No acute finding. Other: None. CT CERVICAL SPINE FINDINGS Alignment: Trace anterolisthesis of C2 on C3, unchanged. No traumatic subluxation. Skull base and vertebrae: No acute fracture. Multiple small lucencies consistent with multiple myeloma, unchanged from prior exam. No evidence of pathologic fracture. Skull base is intact. Soft tissues and spinal canal: No prevertebral fluid or swelling. No visible canal hematoma. Disc levels: Multilevel degenerative disc disease with disc space narrowing and endplate spurring, most prominent at C3-C4 and C6-C7. Multilevel facet arthropathy. Upper chest: Right internal jugular dialysis catheter. No acute findings. Other: Myelomatous lesions noted in the right clavicle, bilateral scapula,, sternum and multiple ribs. IMPRESSION: 1. No acute intracranial abnormality. No skull fracture. Stable atrophy and chronic small vessel ischemia. 2. Multilevel degenerative change in the cervical spine without acute fracture or subluxation. 3. Multiple osseous lesions in the calvarium and cervical spine consistent with known multiple myeloma. Electronically Signed   By: Keith Rake M.D.   On: 10/04/2018 02:32   Ct Head Wo Contrast  Result Date: 10/03/2018 CLINICAL DATA:  Acute onset of altered mental status. EXAM: CT HEAD WITHOUT CONTRAST TECHNIQUE: Contiguous axial images were obtained from the base of the skull through the vertex without intravenous contrast. COMPARISON:  CT of the head and MRI of the brain performed 09/16/2018 FINDINGS: Brain: No evidence of acute infarction, hemorrhage, hydrocephalus, extra-axial collection or mass lesion / mass effect. Prominence of the ventricles and sulci reflects mild cortical volume loss. Mild cerebellar atrophy is noted. Scattered periventricular and subcortical white matter change likely reflects small vessel ischemic microangiopathy. Chronic ischemic change is noted  at the basal ganglia bilaterally. A small chronic lacunar infarct is noted at the left cerebellar hemisphere. The brainstem and fourth ventricle are within normal limits. The cerebral hemispheres demonstrate grossly normal gray-white differentiation. No mass effect or midline shift is seen. Vascular: No hyperdense vessel or unexpected calcification. Skull: There is no evidence of fracture. Numerous large lytic lesions are noted throughout the visualized osseous structures, measuring up to 2.0 cm in size. These likely reflect the patient's known multiple myeloma. Sinuses/Orbits: The orbits are within normal limits. The paranasal sinuses and mastoid air cells are well-aerated. Other: No significant soft tissue abnormalities are seen. IMPRESSION: 1. No acute intracranial pathology seen on CT. 2. Mild cortical volume loss and scattered small vessel ischemic microangiopathy. 3. Chronic ischemic change at the basal ganglia bilaterally. Small chronic lacunar infarct at the left cerebellar hemisphere. 4. Numerous large lytic lesions throughout the visualized osseous structures, measuring up to 2.0 cm in size. These likely reflect the patient's known multiple myeloma. Electronically Signed   By: Garald Balding M.D.   On: 10/03/2018 05:11   Ct Cervical Spine Wo Contrast  Result Date: 10/04/2018 CLINICAL DATA:  Head trauma, ataxia EXAM: CT HEAD WITHOUT CONTRAST CT CERVICAL SPINE WITHOUT CONTRAST TECHNIQUE: Multidetector CT imaging of the head and cervical spine was performed following the standard protocol without intravenous contrast. Multiplanar CT image reconstructions of the cervical spine were also generated. COMPARISON:  Head CT yesterday at 0455 hour, brain MRI 09/16/2018. Cervical spine CT 07/26/2018 FINDINGS: CT HEAD FINDINGS Brain: No intracranial  hemorrhage, mass effect, or midline shift. No hydrocephalus. The basilar cisterns are patent. No evidence of territorial infarct or acute ischemia. No extra-axial or  intracranial fluid collection. Stable degree of atrophy and chronic small vessel ischemia. Remote left cerebellar infarct. Vascular: Atherosclerosis of skullbase vasculature without hyperdense vessel or abnormal calcification. Skull: Innumerable skull lesions consistent with patient's history of myeloma, unchanged. No skull fracture. Sinuses/Orbits: No acute finding. Other: None. CT CERVICAL SPINE FINDINGS Alignment: Trace anterolisthesis of C2 on C3, unchanged. No traumatic subluxation. Skull base and vertebrae: No acute fracture. Multiple small lucencies consistent with multiple myeloma, unchanged from prior exam. No evidence of pathologic fracture. Skull base is intact. Soft tissues and spinal canal: No prevertebral fluid or swelling. No visible canal hematoma. Disc levels: Multilevel degenerative disc disease with disc space narrowing and endplate spurring, most prominent at C3-C4 and C6-C7. Multilevel facet arthropathy. Upper chest: Right internal jugular dialysis catheter. No acute findings. Other: Myelomatous lesions noted in the right clavicle, bilateral scapula,, sternum and multiple ribs. IMPRESSION: 1. No acute intracranial abnormality. No skull fracture. Stable atrophy and chronic small vessel ischemia. 2. Multilevel degenerative change in the cervical spine without acute fracture or subluxation. 3. Multiple osseous lesions in the calvarium and cervical spine consistent with known multiple myeloma. Electronically Signed   By: Keith Rake M.D.   On: 10/04/2018 02:32   Mr Brain Wo Contrast  Result Date: 09/16/2018 CLINICAL DATA:  Altered mental status and weakness. History of end-stage renal disease on dialysis, multiple myeloma, hypertension. EXAM: MRI HEAD WITHOUT CONTRAST TECHNIQUE: Multiplanar, multiecho pulse sequences of the brain and surrounding structures were obtained without intravenous contrast. COMPARISON:  CT HEAD September 16, 2018 and MRI head April 16, 2018 FINDINGS: INTRACRANIAL  CONTENTS: No reduced diffusion to suggest acute ischemia. Numerous infratentorial to lesser extent supratentorial chronic microhemorrhages. Old small LEFT cerebellar infarct. Hazy T2 hyperintense signal bilateral basal ganglia a increased from prior MRI. 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 Patchy supratentorial white matter FLAIR T2 hyperintensities. The ventricles and sulci are normal for patient's age. No suspicious parenchymal signal, masses, mass effect. No abnormal extra-axial fluid collections. No extra-axial masses. VASCULAR: Normal major intracranial vascular flow voids present at skull base. SKULL AND UPPER CERVICAL SPINE: No abnormal sellar expansion. Mildly reduced diffusion with low ADC values of numerous calvarial metastasis corresponding to known multiple myeloma. Heterogeneous calvarial and cervical spine bone marrow signal consistent with myeloproliferative disease. Craniocervical junction maintained. SINUSES/ORBITS: The mastoid air-cells and included paranasal sinuses are well-aerated.The included ocular globes and orbital contents are non-suspicious. OTHER: Patient is edentulous. IMPRESSION: 1. No acute infarct. 2. Increased basal ganglia and thalami edema seen with hypertensive encephalopathy, less likely rapidly progressing small vessel ischemic changes. 3. Moderate chronic small vessel ischemic changes. 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 4. Multiple osseous metastasis/multiple myeloma. Electronically Signed   By: Elon Alas M.D.   On: 09/16/2018 05:24   Dg Chest Port 1 View  Result Date: 09/16/2018 CLINICAL DATA:  Altered mental status. EXAM: PORTABLE CHEST 1 VIEW COMPARISON:  Radiographs 08/25/2018 FINDINGS: Right internal jugular dialysis catheter tip in the distal SVC. Unchanged cardiomegaly. Unchanged aortic tortuosity. Minimal vascular congestion without pulmonary edema. No focal airspace disease, large pleural effusion or pneumothorax. No  acute osseous abnormalities are seen. IMPRESSION: Stable cardiomegaly. Minimal vascular congestion. Electronically Signed   By: Keith Rake M.D.   On: 09/16/2018 01:57   Ct Head Code Stroke Wo Contrast  Result Date: 09/16/2018 CLINICAL DATA:  Code stroke.  RIGHT facial droop. Aphasic. History of multiple myeloma, hypertension. EXAM: CT HEAD WITHOUT CONTRAST TECHNIQUE: Contiguous axial images were obtained from the base of the skull through the vertex without intravenous contrast. COMPARISON:  CT HEAD August 25, 2018 and MRI head April 16, 2016 FINDINGS: Mild motion degraded examination. BRAIN: No intraparenchymal hemorrhage, mass effect nor midline shift. The ventricles and sulci are normal for age. 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 Old small LEFT cerebellar infarct. Patchy supratentorial white matter hypodensities. No acute large vascular territory infarcts. No abnormal extra-axial fluid collections. Basal cisterns are patent. VASCULAR: Mild calcific atherosclerosis of the carotid siphons. SKULL: No skull fracture. Multiple soft tissue masses within the calvarium including LEFT sphenoid eroding the inner and outer tables. No significant scalp soft tissue swelling. SINUSES/ORBITS: Mild paranasal sinus mucosal thickening. Mastoid air cells are well aerated. Soft tissue within the external auditory canals most compatible with cerumen.The included ocular globes and orbital contents are non-suspicious. OTHER: None. ASPECTS [facility] Stroke Program Early CT Score) - Ganglionic level infarction (caudate, lentiform nuclei, internal capsule, insula, M1-M3 cortex): 7 - Supraganglionic infarction (M4-M6 cortex): 3 Total score (0-10 with 10 being normal): 10 IMPRESSION: 1. Mild motion degraded examination.  No acute intracranial process. 2. ASPECTS is 10. 3. Mild chronic small vessel ischemic changes. 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 4. Multiple osseous lesions  consistent with known multiple myeloma. 5. Critical Value/emergent results text paged to [place] via AMION secure system on 09/16/2018 at 12:18 am, including interpreting physician's phone number. Electronically Signed   By: Elon Alas M.D.   On: 09/16/2018 00:18     LOS: 6 days   Oren Binet, MD  [facility]  If 7PM-7AM, please contact night-coverage  Please page via [email or web address]-click on MD name and type text message  10/09/2018, 2:49 PM

## 2018-10-09 NOTE — Progress Notes (Signed)
CSW received consult from MD regarding Hospice placement. CSW spoke with patient's daughter to provide information regarding residential hospice. She requests [facility] since the family is all in [place]. CSW sent referral for review.   Percell Locus Jamie Hafford LCSW [phone]

## 2018-10-09 NOTE — Progress Notes (Addendum)
[facility] Progress Note   Subjective:  Seen and examined at bedside.  Remains confused, unaware of day or place.  Denies SOB, CP, n/v, dizziness and weakness.  Per nursing staff no events overnight.   Objective Vitals:   10/08/18 0922 10/08/18 1345 10/08/18 2149 10/09/18 0552  BP: 132/89 131/78 106/76 104/76  Pulse: 65 64 (!) 55 (!) 48  Resp:  16 19   Temp: 100 F (37.8 C) 100 F (37.8 C) 99.3 F (37.4 C) 99.3 F (37.4 C)  TempSrc: Oral Oral Oral Oral  SpO2: 100% 98% 100% 100%  Weight:      Height:       Physical Exam General:NAD, chronically ill appearing female Heart:RRR Lungs:CTAB, nml WOB Abdomen:soft, NTND Extremities:no LE edema Dialysis Access: R [facility]   Filed Weights   10/04/18 0745 10/04/18 1115 10/07/18 1155  Weight: 57 kg 54.9 kg 61 kg    Intake/Output Summary (Last 24 hours) at 10/09/2018 1055 Last data filed at 10/09/2018 1000 Gross per 24 hour  Intake 480 ml  Output -  Net 480 ml    Additional Objective Labs: Basic Metabolic Panel: Recent Labs  Lab 10/07/18 0540 10/08/18 0423 10/09/18 0401  NA 129* 129* 131*  K 4.0 3.7 4.1  CL 95* 96* 97*  CO2 21* 27 28  GLUCOSE 95 76 75  BUN 55* 22 38*  CREATININE 7.39* 3.96* 5.56*  CALCIUM 9.5 8.7* 9.1  PHOS 3.0  --  3.4   Liver Function Tests: Recent Labs  Lab 10/03/18 0325 10/09/18 0401  AST 18  --   ALT 8  --   ALKPHOS 49  --   BILITOT 0.2*  --   PROT 9.3*  --   ALBUMIN 2.2* 1.7*   CBC: Recent Labs  Lab 10/05/18 0300  10/06/18 0349 10/07/18 0540 10/08/18 0423 10/09/18 0401  WBC 3.9*  --  4.0 5.3 3.6* 3.3*  NEUTROABS 2.7  --  2.6 4.0 2.6 2.5  HGB 7.5*  --  7.0* 8.3* 7.0* 5.9*  HCT 25.3*   < > 23.6* 27.6* 23.2* 20.4*  MCV 104.5*  --  105.8* 105.7* 104.0* 106.3*  PLT 164  --  179 232 189 183   < > = values in this interval not displayed.   Blood Culture    Component Value Date/Time   SDES BLOOD RIGHT HAND 10/06/2018 1118   SPECREQUEST  10/06/2018 1118    BOTTLES  DRAWN AEROBIC AND ANAEROBIC Blood Culture adequate volume   CULT  10/06/2018 1118    NO GROWTH 3 DAYS Performed at [facility], [place] [address] [place], [place] [address]    REPTSTATUS PENDING 10/06/2018 1118   CBG: Recent Labs  Lab 10/03/18 0347  GLUCAP 83   Studies/Results: No results found.  Medications: .  ceFAZolin (ANCEF) IV 2 g (10/07/18 1500)  . cefTAZidime (FORTAZ)  IV 2 g (10/07/18 1501)   . sodium chloride   Intravenous Once  . amLODipine  10 mg Oral QPM  . calcium acetate  1,334 mg Oral TID WC  . Chlorhexidine Gluconate Cloth  6 each Topical Q0600  . Chlorhexidine Gluconate Cloth  6 each Topical Q0600  . cloNIDine  0.2 mg Transdermal Weekly  . doxercalciferol  2 mcg Intravenous Q T,Th,Sa-HD  . feeding supplement (PRO-STAT SUGAR FREE 64)  30 mL Oral BID  . heparin injection (subcutaneous)  5,000 Units Subcutaneous Q8H  . isosorbide mononitrate  60 mg Oral Daily  . lamoTRIgine  50  mg Oral Daily  . latanoprost  1 drop Both Eyes QHS  . LORazepam  0.5 mg Oral QHS  . losartan  50 mg Oral QPM  . mouth rinse  15 mL Mouth Rinse BID  . multivitamin  1 tablet Oral Daily  . valACYclovir  500 mg Oral QODAY    Dialysis Orders: AF TTS 3.5h 425/1.5x EDW 54kg 2K/2.25Ca  TDC No heparin bolus Hectorol 2 mcg IV TIW Retacrit 20000 U IV TIW  Assessment/Plan: 1.Acute encephalopathy - HTN enceph? Recurrent admits w/similar presentation. Head CT w/no acute findings.Waxing and waning confusion.  2. MSSA and GNR Bacteremia - changed to ceftazidime &cefazolin. Rechecking BC - NGTD. ID following. No endocarditis work up d/t comorbidities.Pt has catheter sepsis along w/ multiple comorbidities and FTT. Putting her through another invasive procedure is not likely to improve her QOL. Transition to comfort care would be supported. 3. ESRD -On HD TTS. K4.1.  HD today per regular schedule. No heparin. 4. Anemia of CKD-Hgbdropped to 5.9 today. Patient is Jehovah Witness and  refused blood transfusion ordered on 10/07/18. No iron due to high ferritin.  Give aranesp 279mg with HD today. 5. Secondary hyperparathyroidism -Ca and phos at goal. Continue VDRA/binders.  6. HTN/volume -BPimproved today. Continue meds. Does not appear volume overloaded on exam. Need weights to better assess EDW.  7. Nutrition -Alb 1.7. Renal diet w/fluid restrictions. Vitamins. Prostat.  8. [facility] 9. FTT - seen by palliative care. Poor prognosis.  LJen Mow PA-C [facility] Pager: [phone]/06/2019,10:55 AM  LOS: 6 days   Pt seen, examined and agree w A/P as above.  RKelly SplinterMD CNewell Rubbermaidpager [phone]  10/09/2018, 11:26 AM

## 2018-10-10 LAB — RENAL FUNCTION PANEL
Albumin: 1.8 g/dL — ABNORMAL LOW (ref 3.5–5.0)
Anion gap: 8 (ref 5–15)
BUN: 55 mg/dL — ABNORMAL HIGH (ref 8–23)
CO2: 27 mmol/L (ref 22–32)
CREATININE: 7.34 mg/dL — AB (ref 0.44–1.00)
Calcium: 9 mg/dL (ref 8.9–10.3)
Chloride: 96 mmol/L — ABNORMAL LOW (ref 98–111)
GFR calc Af Amer: 6 mL/min — ABNORMAL LOW (ref >60)
GFR calc non Af Amer: 5 mL/min — ABNORMAL LOW (ref >60)
Glucose, Bld: 76 mg/dL (ref 70–99)
Phosphorus: 3.3 mg/dL (ref 2.5–4.6)
Potassium: 4.6 mmol/L (ref 3.5–5.1)
Sodium: 131 mmol/L — ABNORMAL LOW (ref 135–145)

## 2018-10-10 LAB — CBC
HCT: 20.6 % — ABNORMAL LOW (ref 36.0–46.0)
HCT: 28.2 % — ABNORMAL LOW (ref 36.0–46.0)
HEMOGLOBIN: 8.7 g/dL — AB (ref 12.0–15.0)
Hemoglobin: 5.9 g/dL — CL (ref 12.0–15.0)
MCH: 30.7 pg (ref 26.0–34.0)
MCH: 31.6 pg (ref 26.0–34.0)
MCHC: 28.6 g/dL — ABNORMAL LOW (ref 30.0–36.0)
MCHC: 30.9 g/dL (ref 30.0–36.0)
MCV: 107.3 fL — ABNORMAL HIGH (ref 80.0–100.0)
MCV: 102.5 fL — ABNORMAL HIGH (ref 80.0–100.0)
Platelets: 195 10*3/uL (ref 150–400)
Platelets: 215 10*3/uL (ref 150–400)
RBC: 1.92 MIL/uL — ABNORMAL LOW (ref 3.87–5.11)
RBC: 2.75 MIL/uL — ABNORMAL LOW (ref 3.87–5.11)
RDW: 17.7 % — ABNORMAL HIGH (ref 11.5–15.5)
RDW: 18.2 % — ABNORMAL HIGH (ref 11.5–15.5)
WBC: 3.4 10*3/uL — ABNORMAL LOW (ref 4.0–10.5)
WBC: 3.5 10*3/uL — ABNORMAL LOW (ref 4.0–10.5)
nRBC: 0 % (ref 0.0–0.2)
nRBC: 0 % (ref 0.0–0.2)

## 2018-10-10 LAB — PREPARE RBC (CROSSMATCH)

## 2018-10-10 MED ORDER — AMLODIPINE BESYLATE 10 MG PO TABS
10.0000 mg | ORAL_TABLET | Freq: Every evening | ORAL | Status: DC
  Administered 2018-10-10 – 2018-10-13 (×3): 10 mg via ORAL
  Filled 2018-10-10 (×3): qty 1

## 2018-10-10 MED ORDER — HEPARIN SODIUM (PORCINE) 1000 UNIT/ML IJ SOLN
INTRAMUSCULAR | Status: AC
  Filled 2018-10-10: qty 4

## 2018-10-10 MED ORDER — SODIUM CHLORIDE 0.9 % IV SOLN
2.0000 g | INTRAVENOUS | Status: DC
  Filled 2018-10-10: qty 2

## 2018-10-10 MED ORDER — SODIUM CHLORIDE 0.9% IV SOLUTION: Freq: Once | INTRAVENOUS | Status: DC

## 2018-10-10 MED ORDER — CLONIDINE HCL 0.2 MG/24HR TD PTWK
0.2000 mg | MEDICATED_PATCH | TRANSDERMAL | Status: DC
  Administered 2018-10-10: 0.2 mg via TRANSDERMAL
  Filled 2018-10-10: qty 1

## 2018-10-10 MED ORDER — ISOSORBIDE MONONITRATE ER 60 MG PO TB24
60.0000 mg | ORAL_TABLET | Freq: Every day | ORAL | Status: DC
  Administered 2018-10-12 – 2018-10-13 (×2): 60 mg via ORAL
  Filled 2018-10-10 (×3): qty 1

## 2018-10-10 MED ORDER — CEFAZOLIN SODIUM-DEXTROSE 2-4 GM/100ML-% IV SOLN
2.0000 g | INTRAVENOUS | Status: DC
  Administered 2018-10-10: 2 g via INTRAVENOUS
  Filled 2018-10-10 (×2): qty 100

## 2018-10-10 MED ORDER — SODIUM CHLORIDE 0.9 % IV SOLN
2.0000 g | Freq: Once | INTRAVENOUS | Status: AC
  Administered 2018-10-10: 2 g via INTRAVENOUS
  Filled 2018-10-10: qty 2

## 2018-10-10 MED ORDER — CEFAZOLIN SODIUM-DEXTROSE 2-4 GM/100ML-% IV SOLN
2.0000 g | INTRAVENOUS | Status: DC
  Filled 2018-10-10: qty 100

## 2018-10-10 MED ORDER — CHLORHEXIDINE GLUCONATE CLOTH 2 % EX PADS
6.0000 | MEDICATED_PAD | Freq: Every day | CUTANEOUS | Status: DC
  Administered 2018-10-11 – 2018-10-14 (×4): 6 via TOPICAL

## 2018-10-10 MED ORDER — LOSARTAN POTASSIUM 50 MG PO TABS
50.0000 mg | ORAL_TABLET | Freq: Every evening | ORAL | Status: DC
  Administered 2018-10-12 – 2018-10-13 (×2): 50 mg via ORAL
  Filled 2018-10-10 (×2): qty 1

## 2018-10-10 MED ORDER — LORAZEPAM 0.5 MG PO TABS
0.5000 mg | ORAL_TABLET | Freq: Every day | ORAL | Status: DC
  Administered 2018-10-10 – 2018-10-13 (×4): 0.5 mg via ORAL
  Filled 2018-10-10 (×4): qty 1

## 2018-10-10 MED ORDER — FAMOTIDINE 20 MG PO TABS
20.0000 mg | ORAL_TABLET | Freq: Every day | ORAL | Status: DC
  Administered 2018-10-10 – 2018-10-13 (×4): 20 mg via ORAL
  Filled 2018-10-10 (×5): qty 1

## 2018-10-10 MED ORDER — CHLORHEXIDINE GLUCONATE CLOTH 2 % EX PADS
6.0000 | MEDICATED_PAD | Freq: Every day | CUTANEOUS | Status: DC
  Administered 2018-10-11 – 2018-10-13 (×3): 6 via TOPICAL

## 2018-10-10 MED ORDER — ACETAMINOPHEN 325 MG PO TABS
650.0000 mg | ORAL_TABLET | Freq: Once | ORAL | Status: DC
  Filled 2018-10-10: qty 2

## 2018-10-10 NOTE — Progress Notes (Signed)
[facility] ([facility])  Update from [facility], family is thinking of pursuing further aggressive treatment.    Please let [facility] know how we can support, should the need for [facility] change.  Venia Carbon BSN, RN [facility] Liaison (listed in [place]

## 2018-10-10 NOTE — Progress Notes (Signed)
[name] Progress Note   Subjective:  Family having 2nd thoughts about dialysis withdrawal.   Objective Vitals:   10/09/18 0552 10/09/18 1523 10/09/18 2250 10/10/18 0515  BP: 104/76 123/70 114/77 (!) 147/87  Pulse: (!) 48 (!) 55 (!) 46 (!) 50  Resp:      Temp: 99.3 F (37.4 C) 98.4 F (36.9 C) 98.9 F (37.2 C) 99 F (37.2 C)  TempSrc: Oral  Oral Oral  SpO2: 100% 100% 100% 99%  Weight:      Height:       Physical Exam General:NAD, chronically ill appearing female Heart:RRR Lungs:CTAB, nml WOB Abdomen:soft, NTND Extremities:no LE edema Dialysis Access: R [facility]   Filed Weights   10/04/18 0745 10/04/18 1115 10/07/18 1155  Weight: 57 kg 54.9 kg 61 kg    Intake/Output Summary (Last 24 hours) at 10/10/2018 1037 Last data filed at 10/10/2018 0947 Gross per 24 hour  Intake 240 ml  Output -  Net 240 ml    Additional Objective Labs: Basic Metabolic Panel: Recent Labs  Lab 10/07/18 0540 10/08/18 0423 10/09/18 0401  NA 129* 129* 131*  K 4.0 3.7 4.1  CL 95* 96* 97*  CO2 21* 27 28  GLUCOSE 95 76 75  BUN 55* 22 38*  CREATININE 7.39* 3.96* 5.56*  CALCIUM 9.5 8.7* 9.1  PHOS 3.0  --  3.4   Liver Function Tests: Recent Labs  Lab 10/09/18 0401  ALBUMIN 1.7*   CBC: Recent Labs  Lab 10/05/18 0300  10/06/18 0349 10/07/18 0540 10/08/18 0423 10/09/18 0401  WBC 3.9*  --  4.0 5.3 3.6* 3.3*  NEUTROABS 2.7  --  2.6 4.0 2.6 2.5  HGB 7.5*  --  7.0* 8.3* 7.0* 5.9*  HCT 25.3*   < > 23.6* 27.6* 23.2* 20.4*  MCV 104.5*  --  105.8* 105.7* 104.0* 106.3*  PLT 164  --  179 232 189 183   < > = values in this interval not displayed.   Blood Culture    Component Value Date/Time   SDES BLOOD RIGHT HAND 10/06/2018 1118   SPECREQUEST  10/06/2018 1118    BOTTLES DRAWN AEROBIC AND ANAEROBIC Blood Culture adequate volume   CULT  10/06/2018 1118    NO GROWTH 4 DAYS Performed at [facility], [place] [address] [place], [place] [address]    REPTSTATUS  PENDING 10/06/2018 1118   CBG: Recent Labs  Lab 10/09/18 1733  GLUCAP 93   Studies/Results: No results found.  Medications:  . sodium chloride   Intravenous Once  . acetaminophen  650 mg Oral Once  . amLODipine  10 mg Oral QPM  . Chlorhexidine Gluconate Cloth  6 each Topical Q0600  . Chlorhexidine Gluconate Cloth  6 each Topical Q0600  . cloNIDine  0.2 mg Transdermal Weekly  . famotidine  20 mg Oral Daily  . [START ON 10/11/2018] isosorbide mononitrate  60 mg Oral Daily  . lamoTRIgine  50 mg Oral Daily  . latanoprost  1 drop Both Eyes QHS  . LORazepam  0.5 mg Oral QHS  . [START ON 10/11/2018] losartan  50 mg Oral QPM    Dialysis Orders: AF TTS 3.5h 425/1.5x EDW 54kg 2K/2.25Ca  TDC No heparin bolus Hectorol 2 mcg IV TIW Retacrit 20000 U IV TIW  Assessment/Plan: 1.Acute encephalopathy - Waxing and waning confusion/ agitation and psychosis. 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  2. MSSA and GNR Bacteremia - changed to ceftazidime &cefazolin. Rechecking BC - NGTD. ID  following. No endocarditis work up d/t comorbidities. Not catheter exchange candidate given comorbidities.  3. ESRD -On HD TTS. HD today and tomorrow.  4. Anemia of CKD-Hgbdropped to 5.9 today. Patient is Jehovah Witness and refused blood transfusion ordered on 10/07/18. No iron due to high ferritin.  Give aranesp 269mg with HD today. 5. Secondary hyperparathyroidism -Ca and phos at goal. Continue VDRA/binders.  6. HTN/volume -BPimproved today. Continue meds. Does not appear volume overloaded on exam. 7. Nutrition -Alb 1.7. Renal diet w/fluid restrictions. Vitamins. Prostat.  8. [facility] 9. FTT - seen by palliative care. Appreciate their help.    RKelly SplinterMD C[employer]ell Rubbermaidpager [phone]  10/10/2018, 10:37 AM

## 2018-10-10 NOTE — Progress Notes (Signed)
Pharmacy Antibiotic Note  [name] is a 78 y.o. female admitted on 10/03/2018 with MSSA bacteremia and Achromobacter bacteremia. Patient with ESRD on HD TTS.  Cefazolin for MSSA and ceftazidime  for Achromobacter bacteremia.  Poor prognosis.  Cefazolin and Ceftazidime were discontinued on 1/9 as changed to comfort care. However family has changed her mind.  Pharmacy consulted to restart Cefazolin for MSSA and ceftazidime  for Achromobacter bacteremia.   MD noted etiology of bacteremia is likely secondary to catheter related bloodstream infection-nephrology advises against catheter exchange given severe comorbidities. ID recommending continue antimicrobial therapy with stop date of 11/03/2018 Plan HD today then back to tomorrow TTS   Plan: Restart Cefazolin 2g IV today after HD then qHD-TTS and Ceftazidime 2 g today after HD then qHD=TTS.  Stop date of abx's 11/03/18.  Monitor HD schedule, repeat cultures, clinical improvement  Height: 5\' 6"  (167.6 cm) Weight: 119 lb 0.8 oz (54 kg) IBW/kg (Calculated) : 59.3  Temp (24hrs), Avg:98.7 F (37.1 C), Min:98.4 F (36.9 C), Max:99 F (37.2 C)  Recent Labs  Lab 10/05/18 0300 10/06/18 0349 10/07/18 0540 10/08/18 0423 10/09/18 0401  WBC 3.9* 4.0 5.3 3.6* 3.3*  CREATININE 5.01* 5.95* 7.39* 3.96* 5.56*    Estimated Creatinine Clearance: 7 mL/min (A) (by C-G formula based on SCr of 5.56 mg/dL (H)).    Allergies  Allergen Reactions  . Phenergan [Promethazine Hcl] Other (See Comments)    Acute encephalopathy in the context of refusal to go to hemodialysis and administration of Phenergan for nausea and vomiting    Antimicrobials this admission: Cefepime 1/4 >>1/6 Cefazolin 1/6 >>1/9; restart 1/10>> Ceftaz 1/6 >>1/9;  restart 1/10>>  Dose adjustments this admission:   Microbiology results: 1/3 Bcx: MSSA, Achromobacter (S-Bactrim, Zosyn, Ceftaz) 1/4 Bcx: negative 1/6 blood>>ngtd   Thank you for allowing pharmacy to be a part of this  patient's care.  Jackson Latino, PharmD PGY1 Pharmacy Resident Phone [phone] 10/10/2018     2:01 PM

## 2018-10-10 NOTE — Progress Notes (Signed)
Brief note (full note to follow) Late in the evening yesterday-I received a call from the patient's daughter saying that patient's mother wanted to go back on dialysis.  I explained to the daughter that the her mother is confused and probably cannot make an informed decision.  I subsequently asked palliative care to speak with the family again-as per my long discussion with the patient's daughter yesterday we had transitioned the patient to full comfort care with plans to transition to be can place on discharge.   Palliative care spoke with the family today-it seems that the patient's daughter has changed her mind-now wants the patient to restart dialysis.  I have reached out to nephrology-Dr. Jonnie Finner will speak with patient's daughter.  In the meantime we will restart antimicrobial therapy, and plan on transfusion with HD (if done today).  Will await further recommendations from nephrology and palliative care team.

## 2018-10-10 NOTE — Progress Notes (Addendum)
I have reviewed this case again in detail, reassessed [name] and discussed with palliative PA Florentina Jenny. I have had multiple conversations with the patient's daughter [name] who is struggling to understand her mothers condition and what to expect and how to best care for her. I also discussed this case with Dr. Jonnie Finner who knows [name] from her previous hospitalizations -[name] has a widely fluctuating mental status -probably multifactorial including vascular dementia, uncharacterized pre-existing mental illness/depression and acute delirium from recurrent infection. She is clearly headed towards stopping HD as a quality of life issue and possibly stopping HD may occur from line infection and access issues eventually. I do not believe her family will be able to make decision to stop HD voluntarily or "be ready" but I have begun discussion in preparation for when that may occur. Today, she does not seem to be in the same kind of extremis as I found her in at the time of my initial consult and she was transitioned to comfort care yesterday by Dr. Sloan Leiter which now does not seem to be in line with the daughters goals - and I had discussed with her daughter on 1/8 giving her condition a few days to see if she would improve in terms of her agitation-ultimately her prognosis depended on her ability to tolerate hemodialysis- if HD can continue without a clear contraindication or intolerability we can continue forward with medically appropriate interventions. Would maintain DNR, avoid re-hospitalization and avoid any overly invasive procedures. I recommend that Palliative care consult be placed at SNF and to continue to support and assist both the patient and her daughter.   Lane Hacker, DO Palliative Medicine [phone]  Time: 35 min Greater than 50%  of this time was spent counseling and coordinating care related to the above assessment and plan.

## 2018-10-10 NOTE — Progress Notes (Signed)
   10/10/18 1300  Clinical Encounter Type  Visited With Other (Comment)  Visit Type Initial  Referral From Nurse  Consult/Referral To Chaplain  Recommendations Advise PA to consult family for resources  Spiritual Encounters  Spiritual Needs Other (Comment)   Responded to spiritual care consult. Called PA and she requested I follow up with caring for PT's daughter. Calling PA for more information of the consult, PA requested me to call daughter about counseling and offerring resources she may need. However, upon further assessment of the situation, I am not qualified to counsel daughter in her needs. I reach out the PT's Nurse stating the only thing I can offer is spiritual care for her and her mother(PT) upon her arrival. I left contact information for Nurse. Chaplain available upon request.   Chaplain Fidel Levy [phone]

## 2018-10-10 NOTE — Progress Notes (Signed)
Daily Progress Note   Patient Name: [name]       Date: 10/10/2018 DOB: 30-Dec-1940  Age: 78 y.o. MRN#: [id number] Attending Physician: Jonetta Osgood, MD Primary Care Physician: Hendricks Limes, MD Admit Date: 10/03/2018  Reason for Consultation/Follow-up: Establishing goals of care and Psychosocial/spiritual support  Subjective: After speaking with Drs. Wenda Overland, and Doctor, hospital I talked to [name] the patient's daughter and Air traffic controller.  [name] in tears said "my mother asked me last night to call them and tell them to come get her for dialysis"  She told me that if she doesn't get dialysis she will die and she wants to live.  [name] expressed that she felt she was rushed in to a decision that was wrong.  I talked with [name] for awhile about her brother who died suddenly of an MI in Dec 13, 2012.  She went into a deep depression after that.  [name] states I can't do that again.  She strongly expressed that she understands her mother is going to die but she just does not feel it is her time yet.    [name] talked how unhappy she is with her mother's care at SNF.  Her mother fell at SNF several months ago and hit her head.  She has declined rapidly since then.  We talked about her mother's bacteremia, her psychosis, and about her being so fragile.  I explained that given her mother's mental status she will likely never be "ready to die".  She may never be able to accept the end.   It is a tremendous amount of guilt on [name] having to make a decision to stop hemodialysis against her mother's wishes.   She tells me after she lost her brother in December 13, 2012 she went thru 5 years of depression.  [name] states that she and her mother are very connected - part of her will die when her mother dies  - she can't face another deep depression.  [name] is unwilling to stop HD and treatment at this time.  After listening supportively for a long time - I advised [name] that we know her mother's death is coming because her body will eventually give out.  We have time now to prepare.  I encouraged her to talk with a chaplain and attempt to seek grief counseling.  I further encouraged her to meet with her PCP about starting on an anti-depressant.     Assessment: Pleasantly demented patient currently comfortable.   Bacteremia with line infection Multiple myeloma relapsed - followed by [facility].  Per care everywhere on final regimen of chemotherapy ([facility]) Progressive dementia with episodes of psychosis.  Length of Stay: 7  Current Medications: Scheduled Meds:  . lamoTRIgine  50 mg Oral Daily  . latanoprost  1 drop Both Eyes QHS    Continuous Infusions:   PRN Meds: acetaminophen **OR** acetaminophen, antiseptic oral rinse, glycopyrrolate **OR** glycopyrrolate **OR** glycopyrrolate, hydrALAZINE, HYDROmorphone (DILAUDID) injection, LORazepam **OR** LORazepam **OR** LORazepam, LORazepam, polyvinyl alcohol  Physical Exam        Pleasantly demented female awake, alert, interactive.  Tells me she's hungry and asks when breakfast is coming. CV no frank M/R/G resp no distress Abdomen thin, soft, nt  Vital Signs: BP (!) 147/87 (BP Location: Right Arm)   Pulse (!) 50   Temp 99 F (37.2 C) (Oral)   Resp 19   Ht '5\' 6"'  (1.676 m)   Wt 61 kg   SpO2 99%   BMI 21.71 kg/m  SpO2: SpO2: 99 % O2 Device: O2 Device: Room Air O2 Flow Rate:    Intake/output summary:   Intake/Output Summary (Last 24 hours) at 10/10/2018 0954 Last data filed at 10/10/2018 3832 Gross per 24 hour  Intake 480 ml  Output -  Net 480 ml   LBM: Last BM Date: 10/09/18 Baseline Weight: Weight: 53.9 kg Most recent weight: Weight: 61 kg       Palliative Assessment/Data:30%    Flowsheet Rows     Most Recent  Value  Intake Tab  Referral Department  Hospitalist  Unit at Time of Referral  Intermediate Care Unit  Date Notified  10/04/18  Palliative Care Type  Return patient Palliative Care  Reason for referral  Clarify Goals of Care  Date of Admission  10/03/18  Date first seen by Palliative Care  10/07/18  # of days Palliative referral response time  3 Day(s)  # of days IP prior to Palliative referral  1  Clinical Assessment  Psychosocial & Spiritual Assessment  Palliative Care Outcomes      Patient Active Problem List   Diagnosis Date Noted  . Bacteremia 10/06/2018  . Acute metabolic encephalopathy 91/91/6606  . DNR (do not resuscitate)   . Acute encephalopathy 09/16/2018  . NSTEMI (non-ST elevated myocardial infarction) ([place]) 09/16/2018  . Prolonged QT interval 09/16/2018  . DNR (do not resuscitate) discussion   . Palliative care by specialist   . End-stage renal disease on hemodialysis ([place])   . Hemodialysis catheter infection ([place])   . Anemia associated with chronic renal failure 08/26/2018  . Anemia 08/26/2018  . Hypothermia 08/26/2018  . Weakness generalized 08/25/2018  . Gram-negative bacteremia   . Acute lower UTI 08/12/2018  . Swelling of joint of left wrist 08/12/2018  . Rectal bleeding 08/12/2018  . Neurocognitive deficits 06/05/2018  . Altered mental status 05/28/2018  . Macrocytosis 05/01/2018  . Hypertension, uncontrolled 04/16/2018  . ESRD needing dialysis ([place]) 04/16/2018  . Encephalopathy acute 04/16/2018  . Anxiety and depression 04/16/2018  . Hypertension, accelerated 04/16/2018  . Pressure ulcer, stage I 04/16/2018  . Abnormal chest x-ray 12/26/2017  . HCAP (healthcare-associated pneumonia) 04/24/2017  . Sepsis ([place]) 04/24/2017  . Nausea and vomiting 04/24/2017  . Thrombocytopenia ([place]) 04/24/2017  . Hyperkalemia 01/16/2017  . IBS (irritable bowel syndrome) 12/27/2016  . Mood  disorder ([place]) 07/19/2016  . Diastolic dysfunction 25/91/0289  . Insomnia  04/27/2016  . Hyponatremia 03/12/2016  . Diarrhea 03/12/2016  . Hypertensive urgency 02/18/2016  . Swelling of joint of left knee 02/18/2016  . Leukopenia due to antineoplastic chemotherapy ([place]) 09/03/2015  . Malnutrition of moderate degree 09/02/2015  . GERD (gastroesophageal reflux disease) 04/23/2015  . 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 Multiple myeloma ([place]) 03/11/2014  . Hypertension associatd with end stage renal disease on dialysis 03/11/2014    Palliative Care Plan    Recommendations/Plan:  Daughter requests that we continue full scope treatment.   I encouraged her to prepare herself as well as possible as her mother will pass away soon.  I encouraged her to seek counseling and visit her PCP to consider starting an antidepressant medication.  PMT will continue to follow up with family while patient is in-house  Chaplain consult placed to meet with daughter.  Palliative to follow outpatient.  Daughter requests a different SNF on discharge.  Goals of Care and Additional Recommendations:  Limitations on Scope of Treatment: Full Scope Treatment  Code Status:  DNR  Prognosis:   Unable to determine likely weeks.  She is at high risk for rapid decline, death, and re-admission   Discharge Planning:  [place] for rehab with Palliative care service follow-up  Care plan was discussed with Drs. Bonnye Fava, patient's daughter.  Thank you for allowing the Palliative Medicine Team to assist in the care of this patient.  Total time spent:  60 min     Greater than 50%  of this time was spent counseling and coordinating care related to the above assessment and plan.  Florentina Jenny, PA-C Palliative Medicine  Please contact Palliative MedicineTeam phone at [phone] for questions and concerns between 7 am - 7 pm.   Please see AMION for individual provider pager numbers.

## 2018-10-10 NOTE — Clinical Social Work Note (Signed)
Clinical Social Work Assessment  Patient Details  Name: [name] MRN: [id number] Date of Birth: 09-04-1941  Date of referral:  10/10/18               Reason for consult:  Facility Placement                Permission sought to share information with:  Facility Sport and exercise psychologist, Family Supports Permission granted to share information::  No  Name::     Fish farm manager::  [facility]  Relationship::  Daughter  Contact Information:  [phone]  Housing/Transportation Living arrangements for the past 2 months:  [place] of Information:  Adult Children Patient Interpreter Needed:  None Criminal Activity/Legal Involvement Pertinent to Current Situation/Hospitalization:  No - Comment as needed Significant Relationships:  Adult Children Lives with:  Facility Resident Do you feel safe going back to the place where you live?  Yes Need for family participation in patient care:  Yes (Comment)  Care giving concerns:  CSW received consult for possible SNF placement at time of discharge. CSW spoke with patient's daughter regarding discharge plan. She reported that she does not want patient to return to [facility] at discharge due to patient not receiving proper care. CSW to continue to follow and assist with discharge planning needs.   Social Worker assessment / plan:  CSW spoke with patient's daughter concerning return to SNF.  Employment status:  Retired Forensic scientist:  Medicaid In [place], [place] PT Recommendations:  [place] / Referral to community resources:  [place]  Patient/Family's Response to care:  CSW explained to patient's daughter that no bed offers exist for patient due to being long term care, needing transport to dialysis in [place], and transportation to the cancer center in [place]. CSW explained that patient's daughter needs to tour facilities and be put on a Medicaid waitlist if she still  wants a different facility after discharge from the hospital. She stated that she will consider [facility] for the patient if she does not improve over the next few days.   Patient/Family's Understanding of and Emotional Response to Diagnosis, Current Treatment, and Prognosis:  Patient/family is realistic regarding therapy needs and expressed being hopeful for improvement. Patient's daughter expressed understanding of CSW role and discharge process as well as medical condition. No questions/concerns about plan or treatment.    Emotional Assessment Appearance:  Appears stated age Attitude/Demeanor/Rapport:  Unable to Assess Affect (typically observed):  Unable to Assess Orientation:  Oriented to Self, Oriented to Place Alcohol / Substance use:  Not Applicable Psych involvement (Current and /or in the community):  No (Comment)  Discharge Needs  Concerns to be addressed:  Care Coordination Readmission within the last 30 days:  Yes Current discharge risk:  Cognitively Impaired, Dependent with Mobility, Chronically ill Barriers to Discharge:  Continued Medical Work up   [employer], LCSW 10/10/2018, 3:21 PM

## 2018-10-10 NOTE — Progress Notes (Signed)
PROGRESS NOTE        PATIENT DETAILS Name: [name] Age: 78 y.o. Sex: female Date of Birth: [birth year] Admit Date: 10/03/2018 Admitting Physician Kayleen Memos, DO ESL:[id number], Darrick Penna, MD  Brief Narrative: Patient is a 78 y.o. female history of ESRD on HD TTS, multiple myeloma, dementia, chronic diastolic heart failure, chronic hyponatremia-ambulatory dysfunction-bed to wheelchair bound-presented with worsening confusion-thought to be secondary to hypertensive encephalopathy and recurrent bacteremia.  See below for further details  Subjective: Still confused-does not make much sense-occasionally knows her daughter's name, and knows that she is on HD  Assessment/Plan: Acute metabolic encephalopathy: Continues to be confused-not sure what her baseline is-but seems to be waxing and waning over the past few days.  Has dementia at baseline-but encephalopathy on admission was thought to be secondary from bacteria and hypertensive encephalopathy.  CT head on 1/4 was without any acute abnormalities.   MSSA/Achromobacter bacteremia: ID following-treated with cefazolin and ceftazidime-on 1/9-family agreed to transition to comfort measures-however family has changed her mind-cefazolin and ceftazidime has been restarted.  Etiology of bacteremia is likely secondary to catheter related bloodstream infection-nephrology advises against catheter exchange given severe comorbidities.  ID recommending continue antimicrobial therapy with stop date of 11/03/2018  Hypertensive emergency: We will go and resume all antihypertensives-blood pressure is reasonably well controlled.  All antihypertensives were stopped yesterday in anticipation of comfort measures.  Anemia: Secondary to CKD/ESRD/multiple myeloma/acute illness-we will go and transfuse 1 unit of PRBC-RN obtain consent from patient's daughter.  From my conversation with the patient's daughter yesterday-she has given consent in the  past even though patient's is apparently a Jehovah's Witness  ESRD: After discussion with nephrology on 1/9 and then with the patient's daughter-comfort measures were initiated with no plans to start HD-however patient's daughter has changed her mind this morning-nephrology planning on restarting hemodialysis today.  Usual schedule is TTS.  Chronic diastolic heart failure: Volume management with HD  Chronic hyponatremia: Appears mild-managed with HD  Dysphagia: Seen by speech therapy-continue dysphagia 1 diet with full aspiration precautions.  Moderate pulmonary hypertension: Supportive care  Failure to thrive syndrome/debility/deconditioning/palliative care: DNR in [place] poor overall prognosis-continues to have recurrent bacteremia and failure to thrive syndrome.  Has significant amount of debility/deconditioning at baseline-mostly bed to wheelchair bound.  After extensive discussion on 1/9-comfort care measures were initiated-family/daughter agreed not to pursue HD.  However on 1/10-family has changed her mind-DNR in place but we are continuing HD and antimicrobial therapy.  Palliative care following.  DVT Prophylaxis: None  Code Status: DNR  Family Communication: None at bedside-has spoken with daughter extensively yesterday-palliative care has already spoken with daughter today.  Disposition Plan: Remain inpatient-back to SNF over the next few days  Antimicrobial agents: Anti-infectives (From admission, onward)   Start     Dose/Rate Route Frequency Ordered Stop   10/07/18 1200  ceFAZolin (ANCEF) IVPB 2g/100 mL premix  Status:  Discontinued     2 g 200 mL/hr over 30 Minutes Intravenous Every T-Th-Sa (Hemodialysis) 10/06/18 1004 10/09/18 1422   10/07/18 1200  cefTAZidime (FORTAZ) 2 g in sodium chloride 0.9 % 100 mL IVPB  Status:  Discontinued     2 g 200 mL/hr over 30 Minutes Intravenous Every T-Th-Sa (Hemodialysis) 10/06/18 1004 10/09/18 1422   10/06/18 1015  cefTAZidime  (FORTAZ) 2 g in sodium chloride 0.9 % 100 mL IVPB  2 g 200 mL/hr over 30 Minutes Intravenous  Once 10/06/18 1004 10/06/18 1222   10/04/18 2000  ceFEPIme (MAXIPIME) 1 g in sodium chloride 0.9 % 100 mL IVPB  Status:  Discontinued     1 g 200 mL/hr over 30 Minutes Intravenous Every 24 hours 10/04/18 1829 10/06/18 1004   10/04/18 1800  ceFAZolin (ANCEF) IVPB 2g/100 mL premix  Status:  Discontinued     2 g 200 mL/hr over 30 Minutes Intravenous Every T-Th-Sa (1800) 10/04/18 1313 10/04/18 1829   10/03/18 1400  valACYclovir (VALTREX) tablet 500 mg  Status:  Discontinued     500 mg Oral Every other day 10/03/18 0852 10/09/18 1422   10/03/18 1330  piperacillin-tazobactam (ZOSYN) IVPB 3.375 g  Status:  Discontinued     3.375 g 12.5 mL/hr over 240 Minutes Intravenous Every 12 hours 10/03/18 1317 10/04/18 1255   10/03/18 1315  piperacillin-tazobactam (ZOSYN) injection 2.25 g  Status:  Discontinued     2.25 g Intramuscular Every 12 hours 10/03/18 1305 10/03/18 1316      Procedures: None  CONSULTS:  nephrology and Palliative care, ID  Time spent: 25 minutes-Greater than 50% of this time was spent in counseling, explanation of diagnosis, planning of further management, and coordination of care.  MEDICATIONS: Scheduled Meds: . sodium chloride   Intravenous Once  . acetaminophen  650 mg Oral Once  . amLODipine  10 mg Oral QPM  . Chlorhexidine Gluconate Cloth  6 each Topical Q0600  . Chlorhexidine Gluconate Cloth  6 each Topical Q0600  . cloNIDine  0.2 mg Transdermal Weekly  . famotidine  20 mg Oral Daily  . [START ON 10/11/2018] isosorbide mononitrate  60 mg Oral Daily  . lamoTRIgine  50 mg Oral Daily  . latanoprost  1 drop Both Eyes QHS  . LORazepam  0.5 mg Oral QHS  . [START ON 10/11/2018] losartan  50 mg Oral QPM   Continuous Infusions:  PRN Meds:.acetaminophen **OR** acetaminophen, antiseptic oral rinse, hydrALAZINE, HYDROmorphone (DILAUDID) injection, LORazepam **OR** LORazepam  **OR** LORazepam, LORazepam, polyvinyl alcohol   PHYSICAL EXAM: Vital signs: Vitals:   10/09/18 0552 10/09/18 1523 10/09/18 2250 10/10/18 0515  BP: 104/76 123/70 114/77 (!) 147/87  Pulse: (!) 48 (!) 55 (!) 46 (!) 50  Resp:      Temp: 99.3 F (37.4 C) 98.4 F (36.9 C) 98.9 F (37.2 C) 99 F (37.2 C)  TempSrc: Oral  Oral Oral  SpO2: 100% 100% 100% 99%  Weight:      Height:       Filed Weights   10/04/18 0745 10/04/18 1115 10/07/18 1155  Weight: 57 kg 54.9 kg 61 kg   Body mass index is 21.71 kg/m.   General appearance:Awake, still confused Eyes:no scleral icterus. HEENT: Atraumatic and Normocephalic Neck: supple, no JVD. Resp:Good air entry bilaterally,no rales or rhonchi CVS: S1 S2 regular GI: Bowel sounds present, Non tender and not distended with no gaurding, rigidity or rebound. Extremities: B/L Lower Ext shows no edema, both legs are warm to touch Neurology:  Non focal   I have personally reviewed following labs and imaging studies  LABORATORY DATA: CBC: Recent Labs  Lab 10/05/18 0300 10/05/18 1359 10/06/18 0349 10/07/18 0540 10/08/18 0423 10/09/18 0401  WBC 3.9*  --  4.0 5.3 3.6* 3.3*  NEUTROABS 2.7  --  2.6 4.0 2.6 2.5  HGB 7.5*  --  7.0* 8.3* 7.0* 5.9*  HCT 25.3* 21.9* 23.6* 27.6* 23.2* 20.4*  MCV 104.5*  --  105.8* 105.7*  104.0* 106.3*  PLT 164  --  179 232 189 258    Basic Metabolic Panel: Recent Labs  Lab 10/05/18 0300 10/06/18 0349 10/07/18 0540 10/08/18 0423 10/09/18 0401  NA 131* 129* 129* 129* 131*  K 3.7 3.8 4.0 3.7 4.1  CL 97* 96* 95* 96* 97*  CO2 28 25 21* 27 28  GLUCOSE 71 81 95 76 75  BUN 19 35* 55* 22 38*  CREATININE 5.01* 5.95* 7.39* 3.96* 5.56*  CALCIUM 8.4* 8.7* 9.5 8.7* 9.1  PHOS  --   --  3.0  --  3.4    GFR: Estimated Creatinine Clearance: 7.7 mL/min (A) (by C-G formula based on SCr of 5.56 mg/dL (H)).  Liver Function Tests: Recent Labs  Lab 10/09/18 0401  ALBUMIN 1.7*   No results for input(s): LIPASE,  AMYLASE in the last 168 hours. No results for input(s): AMMONIA in the last 168 hours.  Coagulation Profile: No results for input(s): INR, PROTIME in the last 168 hours.  Cardiac Enzymes: No results for input(s): CKTOTAL, CKMB, CKMBINDEX, TROPONINI in the last 168 hours.  BNP (last 3 results) No results for input(s): PROBNP in the last 8760 hours.  HbA1C: No results for input(s): HGBA1C in the last 72 hours.  CBG: Recent Labs  Lab 10/09/18 1733  GLUCAP 93    Lipid Profile: No results for input(s): CHOL, HDL, LDLCALC, TRIG, CHOLHDL, LDLDIRECT in the last 72 hours.  Thyroid Function Tests: No results for input(s): TSH, T4TOTAL, FREET4, T3FREE, THYROIDAB in the last 72 hours.  Anemia Panel: No results for input(s): VITAMINB12, FOLATE, FERRITIN, TIBC, IRON, RETICCTPCT in the last 72 hours.  Urine analysis:    Component Value Date/Time   COLORURINE STRAW (A) 09/16/2018 0326   APPEARANCEUR CLEAR 09/16/2018 0326   LABSPEC 1.010 09/16/2018 0326   PHURINE 9.0 (H) 09/16/2018 0326   GLUCOSEU 50 (A) 09/16/2018 0326   HGBUR NEGATIVE 09/16/2018 0326   BILIRUBINUR NEGATIVE 09/16/2018 0326   KETONESUR 5 (A) 09/16/2018 0326   PROTEINUR 100 (A) 09/16/2018 0326   UROBILINOGEN 0.2 04/10/2015 1800   NITRITE NEGATIVE 09/16/2018 0326   LEUKOCYTESUR NEGATIVE 09/16/2018 0326    Sepsis Labs: Lactic Acid, Venous    Component Value Date/Time   LATICACIDVEN 1.31 09/16/2018 0334    MICROBIOLOGY: Recent Results (from the past 240 hour(s))  Blood culture (routine x 2)     Status: Abnormal   Collection Time: 10/03/18  7:30 AM  Result Value Ref Range Status   Specimen Description BLOOD RIGHT HAND  Final   Special Requests   Final    BOTTLES DRAWN AEROBIC AND ANAEROBIC Blood Culture adequate volume Performed at [facility], [place] [address], [place], [place] [address]    Culture  Setup Time   Final    GRAM NEGATIVE RODS AEROBIC BOTTLE ONLY CRITICAL RESULT CALLED TO, READ BACK BY  AND VERIFIED WITH: PHARMD BEN MANCHERIL [id number] FCP    Culture ACHROMOBACTER XYLOSOXIDANS (A)  Final   Report Status 10/06/2018 FINAL  Final   Organism ID, Bacteria ACHROMOBACTER XYLOSOXIDANS  Final      Susceptibility   Achromobacter xylosoxidans - MIC*    CEFEPIME 16 INTERMEDIATE Intermediate     CEFAZOLIN >=64 RESISTANT Resistant     GENTAMICIN >=16 RESISTANT Resistant     CIPROFLOXACIN >=4 RESISTANT Resistant     IMIPENEM 8 INTERMEDIATE Intermediate     TRIMETH/SULFA <=20 SENSITIVE Sensitive     CEFTAZIDIME Value in next row Sensitive  SENSITIVE4    PIP/TAZO Value in next row Sensitive      SENSITIVE<=4    * ACHROMOBACTER XYLOSOXIDANS  Blood Culture ID Panel (Reflexed)     Status: None   Collection Time: 10/03/18  7:31 AM  Result Value Ref Range Status   Enterococcus species NOT DETECTED NOT DETECTED Final   Listeria monocytogenes NOT DETECTED NOT DETECTED Final   Staphylococcus species NOT DETECTED NOT DETECTED Final   Staphylococcus aureus (BCID) NOT DETECTED NOT DETECTED Final   Streptococcus species NOT DETECTED NOT DETECTED Final   Streptococcus agalactiae NOT DETECTED NOT DETECTED Final   Streptococcus pneumoniae NOT DETECTED NOT DETECTED Final   Streptococcus pyogenes NOT DETECTED NOT DETECTED Final   Acinetobacter baumannii NOT DETECTED NOT DETECTED Final   Enterobacteriaceae species NOT DETECTED NOT DETECTED Final   Enterobacter cloacae complex NOT DETECTED NOT DETECTED Final   Escherichia coli NOT DETECTED NOT DETECTED Final   Klebsiella oxytoca NOT DETECTED NOT DETECTED Final   Klebsiella pneumoniae NOT DETECTED NOT DETECTED Final   Proteus species NOT DETECTED NOT DETECTED Final   Serratia marcescens NOT DETECTED NOT DETECTED Final   Haemophilus influenzae NOT DETECTED NOT DETECTED Final   Neisseria meningitidis NOT DETECTED NOT DETECTED Final   Pseudomonas aeruginosa NOT DETECTED NOT DETECTED Final   Candida albicans NOT DETECTED NOT DETECTED Final    Candida glabrata NOT DETECTED NOT DETECTED Final   Candida krusei NOT DETECTED NOT DETECTED Final   Candida parapsilosis NOT DETECTED NOT DETECTED Final   Candida tropicalis NOT DETECTED NOT DETECTED Final  Blood culture (routine x 2)     Status: Abnormal   Collection Time: 10/03/18  7:33 AM  Result Value Ref Range Status   Specimen Description BLOOD RIGHT ANTECUBITAL  Final   Special Requests   Final    BOTTLES DRAWN AEROBIC AND ANAEROBIC Blood Culture results may not be optimal due to an inadequate volume of blood received in culture bottles   Culture  Setup Time   Final    GRAM POSITIVE COCCI AEROBIC BOTTLE ONLY Organism ID to follow CRITICAL RESULT CALLED TO, READ BACK BY AND VERIFIED WITHKarsten Ro PHARMD [id number] 10/04/18 A BROWNING Performed at [facility], [address], [place], [place] [address]    Culture STAPHYLOCOCCUS AUREUS (A)  Final   Report Status 10/06/2018 FINAL  Final   Organism ID, Bacteria STAPHYLOCOCCUS AUREUS  Final      Susceptibility   Staphylococcus aureus - MIC*    CIPROFLOXACIN <=0.5 SENSITIVE Sensitive     ERYTHROMYCIN <=0.25 SENSITIVE Sensitive     GENTAMICIN <=0.5 SENSITIVE Sensitive     OXACILLIN 0.5 SENSITIVE Sensitive     TETRACYCLINE <=1 SENSITIVE Sensitive     VANCOMYCIN <=0.5 SENSITIVE Sensitive     TRIMETH/SULFA <=10 SENSITIVE Sensitive     CLINDAMYCIN <=0.25 SENSITIVE Sensitive     RIFAMPIN <=0.5 SENSITIVE Sensitive     Inducible Clindamycin NEGATIVE Sensitive     * STAPHYLOCOCCUS AUREUS  Blood Culture ID Panel (Reflexed)     Status: Abnormal   Collection Time: 10/03/18  7:33 AM  Result Value Ref Range Status   Enterococcus species NOT DETECTED NOT DETECTED Final   Listeria monocytogenes NOT DETECTED NOT DETECTED Final   Staphylococcus species DETECTED (A) NOT DETECTED Final    Comment: CRITICAL RESULT CALLED TO, READ BACK BY AND VERIFIED WITHLenna Sciara [facility] PHARMD 0240 10/04/18 A BROWNING    Staphylococcus aureus (BCID) DETECTED (A) NOT  DETECTED Final  Comment: Methicillin (oxacillin) susceptible Staphylococcus aureus (MSSA). Preferred therapy is anti staphylococcal beta lactam antibiotic (Cefazolin or Nafcillin), unless clinically contraindicated. CRITICAL RESULT CALLED TO, READ BACK BY AND VERIFIED WITH: Karsten Ro PHARMD 0240 10/04/18 A BROWNING    Methicillin resistance NOT DETECTED NOT DETECTED Final   Streptococcus species NOT DETECTED NOT DETECTED Final   Streptococcus agalactiae NOT DETECTED NOT DETECTED Final   Streptococcus pneumoniae NOT DETECTED NOT DETECTED Final   Streptococcus pyogenes NOT DETECTED NOT DETECTED Final   Acinetobacter baumannii NOT DETECTED NOT DETECTED Final   Enterobacteriaceae species NOT DETECTED NOT DETECTED Final   Enterobacter cloacae complex NOT DETECTED NOT DETECTED Final   Escherichia coli NOT DETECTED NOT DETECTED Final   Klebsiella oxytoca NOT DETECTED NOT DETECTED Final   Klebsiella pneumoniae NOT DETECTED NOT DETECTED Final   Proteus species NOT DETECTED NOT DETECTED Final   Serratia marcescens NOT DETECTED NOT DETECTED Final   Haemophilus influenzae NOT DETECTED NOT DETECTED Final   Neisseria meningitidis NOT DETECTED NOT DETECTED Final   Pseudomonas aeruginosa NOT DETECTED NOT DETECTED Final   Candida albicans NOT DETECTED NOT DETECTED Final   Candida glabrata NOT DETECTED NOT DETECTED Final   Candida krusei NOT DETECTED NOT DETECTED Final   Candida parapsilosis NOT DETECTED NOT DETECTED Final   Candida tropicalis NOT DETECTED NOT DETECTED Final    Comment: Performed at [facility], [place] [address], [place], [place] [address]  MRSA PCR Screening     Status: None   Collection Time: 10/03/18  6:46 PM  Result Value Ref Range Status   MRSA by PCR NEGATIVE NEGATIVE Final    Comment:        The GeneXpert MRSA Assay (FDA approved for NASAL specimens only), is one component of a comprehensive MRSA colonization surveillance program. It is not intended to diagnose  MRSA infection nor to guide or monitor treatment for MRSA infections. Performed at [facility], [place] [address], [place], [place] [address]   Culture, blood (Routine X 2) w Reflex to ID Panel     Status: None   Collection Time: 10/04/18  1:18 PM  Result Value Ref Range Status   Specimen Description BLOOD BLOOD LEFT HAND  Final   Special Requests AEROBIC BOTTLE ONLY Blood Culture adequate volume  Final   Culture   Final    NO GROWTH 5 DAYS Performed at [facility], [place] [address], [place], [place] [address]    Report Status 10/09/2018 FINAL  Final  Culture, blood (routine x 2)     Status: None (Preliminary result)   Collection Time: 10/06/18 11:13 AM  Result Value Ref Range Status   Specimen Description BLOOD RIGHT FOREARM  Final   Special Requests   Final    BOTTLES DRAWN AEROBIC AND ANAEROBIC Blood Culture adequate volume   Culture   Final    NO GROWTH 4 DAYS Performed at [facility], [place] [address], [place], [place] [address]    Report Status PENDING  Incomplete  Culture, blood (routine x 2)     Status: None (Preliminary result)   Collection Time: 10/06/18 11:18 AM  Result Value Ref Range Status   Specimen Description BLOOD RIGHT HAND  Final   Special Requests   Final    BOTTLES DRAWN AEROBIC AND ANAEROBIC Blood Culture adequate volume   Culture   Final    NO GROWTH 4 DAYS Performed at [facility], [place] [address], [place], [place] [address]  Report Status PENDING  Incomplete    RADIOLOGY STUDIES/RESULTS: Ct Head Wo Contrast  Result Date: 10/04/2018 CLINICAL DATA:  Head trauma, ataxia EXAM: CT HEAD WITHOUT CONTRAST CT CERVICAL SPINE WITHOUT CONTRAST TECHNIQUE: Multidetector CT imaging of the head and cervical spine was performed following the standard protocol without intravenous contrast. Multiplanar CT image reconstructions of the cervical spine were also generated. COMPARISON:  Head CT yesterday at 0455 hour, brain MRI 09/16/2018. Cervical  spine CT 07/26/2018 FINDINGS: CT HEAD FINDINGS Brain: No intracranial hemorrhage, mass effect, or midline shift. No hydrocephalus. The basilar cisterns are patent. No evidence of territorial infarct or acute ischemia. No extra-axial or intracranial fluid collection. Stable degree of atrophy and chronic small vessel ischemia. Remote left cerebellar infarct. Vascular: Atherosclerosis of skullbase vasculature without hyperdense vessel or abnormal calcification. Skull: Innumerable skull lesions consistent with patient's history of myeloma, unchanged. No skull fracture. Sinuses/Orbits: No acute finding. Other: None. CT CERVICAL SPINE FINDINGS Alignment: Trace anterolisthesis of C2 on C3, unchanged. No traumatic subluxation. Skull base and vertebrae: No acute fracture. Multiple small lucencies consistent with multiple myeloma, unchanged from prior exam. No evidence of pathologic fracture. Skull base is intact. Soft tissues and spinal canal: No prevertebral fluid or swelling. No visible canal hematoma. Disc levels: Multilevel degenerative disc disease with disc space narrowing and endplate spurring, most prominent at C3-C4 and C6-C7. Multilevel facet arthropathy. Upper chest: Right internal jugular dialysis catheter. No acute findings. Other: Myelomatous lesions noted in the right clavicle, bilateral scapula,, sternum and multiple ribs. IMPRESSION: 1. No acute intracranial abnormality. No skull fracture. Stable atrophy and chronic small vessel ischemia. 2. Multilevel degenerative change in the cervical spine without acute fracture or subluxation. 3. Multiple osseous lesions in the calvarium and cervical spine consistent with known multiple myeloma. Electronically Signed   By: Keith Rake M.D.   On: 10/04/2018 02:32   Ct Head Wo Contrast  Result Date: 10/03/2018 CLINICAL DATA:  Acute onset of altered mental status. EXAM: CT HEAD WITHOUT CONTRAST TECHNIQUE: Contiguous axial images were obtained from the base of the  skull through the vertex without intravenous contrast. COMPARISON:  CT of the head and MRI of the brain performed 09/16/2018 FINDINGS: Brain: No evidence of acute infarction, hemorrhage, hydrocephalus, extra-axial collection or mass lesion / mass effect. Prominence of the ventricles and sulci reflects mild cortical volume loss. Mild cerebellar atrophy is noted. Scattered periventricular and subcortical white matter change likely reflects small vessel ischemic microangiopathy. Chronic ischemic change is noted at the basal ganglia bilaterally. A small chronic lacunar infarct is noted at the left cerebellar hemisphere. The brainstem and fourth ventricle are within normal limits. The cerebral hemispheres demonstrate grossly normal gray-white differentiation. No mass effect or midline shift is seen. Vascular: No hyperdense vessel or unexpected calcification. Skull: There is no evidence of fracture. Numerous large lytic lesions are noted throughout the visualized osseous structures, measuring up to 2.0 cm in size. These likely reflect the patient's known multiple myeloma. Sinuses/Orbits: The orbits are within normal limits. The paranasal sinuses and mastoid air cells are well-aerated. Other: No significant soft tissue abnormalities are seen. IMPRESSION: 1. No acute intracranial pathology seen on CT. 2. Mild cortical volume loss and scattered small vessel ischemic microangiopathy. 3. Chronic ischemic change at the basal ganglia bilaterally. Small chronic lacunar infarct at the left cerebellar hemisphere. 4. Numerous large lytic lesions throughout the visualized osseous structures, measuring up to 2.0 cm in size. These likely reflect the patient's known multiple myeloma. Electronically Signed   By: Jacqulynn Cadet  Chang M.D.   On: 10/03/2018 05:11   Ct Cervical Spine Wo Contrast  Result Date: 10/04/2018 CLINICAL DATA:  Head trauma, ataxia EXAM: CT HEAD WITHOUT CONTRAST CT CERVICAL SPINE WITHOUT CONTRAST TECHNIQUE: Multidetector  CT imaging of the head and cervical spine was performed following the standard protocol without intravenous contrast. Multiplanar CT image reconstructions of the cervical spine were also generated. COMPARISON:  Head CT yesterday at 0455 hour, brain MRI 09/16/2018. Cervical spine CT 07/26/2018 FINDINGS: CT HEAD FINDINGS Brain: No intracranial hemorrhage, mass effect, or midline shift. No hydrocephalus. The basilar cisterns are patent. No evidence of territorial infarct or acute ischemia. No extra-axial or intracranial fluid collection. Stable degree of atrophy and chronic small vessel ischemia. Remote left cerebellar infarct. Vascular: Atherosclerosis of skullbase vasculature without hyperdense vessel or abnormal calcification. Skull: Innumerable skull lesions consistent with patient's history of myeloma, unchanged. No skull fracture. Sinuses/Orbits: No acute finding. Other: None. CT CERVICAL SPINE FINDINGS Alignment: Trace anterolisthesis of C2 on C3, unchanged. No traumatic subluxation. Skull base and vertebrae: No acute fracture. Multiple small lucencies consistent with multiple myeloma, unchanged from prior exam. No evidence of pathologic fracture. Skull base is intact. Soft tissues and spinal canal: No prevertebral fluid or swelling. No visible canal hematoma. Disc levels: Multilevel degenerative disc disease with disc space narrowing and endplate spurring, most prominent at C3-C4 and C6-C7. Multilevel facet arthropathy. Upper chest: Right internal jugular dialysis catheter. No acute findings. Other: Myelomatous lesions noted in the right clavicle, bilateral scapula,, sternum and multiple ribs. IMPRESSION: 1. No acute intracranial abnormality. No skull fracture. Stable atrophy and chronic small vessel ischemia. 2. Multilevel degenerative change in the cervical spine without acute fracture or subluxation. 3. Multiple osseous lesions in the calvarium and cervical spine consistent with known multiple myeloma.  Electronically Signed   By: Keith Rake M.D.   On: 10/04/2018 02:32   Mr Brain Wo Contrast  Result Date: 09/16/2018 CLINICAL DATA:  Altered mental status and weakness. History of end-stage renal disease on dialysis, multiple myeloma, hypertension. EXAM: MRI HEAD WITHOUT CONTRAST TECHNIQUE: Multiplanar, multiecho pulse sequences of the brain and surrounding structures were obtained without intravenous contrast. COMPARISON:  CT HEAD September 16, 2018 and MRI head April 16, 2018 FINDINGS: INTRACRANIAL CONTENTS: No reduced diffusion to suggest acute ischemia. Numerous infratentorial to lesser extent supratentorial chronic microhemorrhages. Old small LEFT cerebellar infarct. Hazy T2 hyperintense signal bilateral basal ganglia a increased from prior MRI. 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 Patchy supratentorial white matter FLAIR T2 hyperintensities. The ventricles and sulci are normal for patient's age. No suspicious parenchymal signal, masses, mass effect. No abnormal extra-axial fluid collections. No extra-axial masses. VASCULAR: Normal major intracranial vascular flow voids present at skull base. SKULL AND UPPER CERVICAL SPINE: No abnormal sellar expansion. Mildly reduced diffusion with low ADC values of numerous calvarial metastasis corresponding to known multiple myeloma. Heterogeneous calvarial and cervical spine bone marrow signal consistent with myeloproliferative disease. Craniocervical junction maintained. SINUSES/ORBITS: The mastoid air-cells and included paranasal sinuses are well-aerated.The included ocular globes and orbital contents are non-suspicious. OTHER: Patient is edentulous. IMPRESSION: 1. No acute infarct. 2. Increased basal ganglia and thalami edema seen with hypertensive encephalopathy, less likely rapidly progressing small vessel ischemic changes. 3. Moderate chronic small vessel ischemic changes. 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 4. Multiple  osseous metastasis/multiple myeloma. Electronically Signed   By: Elon Alas M.D.   On: 09/16/2018 05:24   Dg Chest Port 1 View  Result Date: 09/16/2018 CLINICAL DATA:  Altered mental status. EXAM: PORTABLE CHEST 1 VIEW COMPARISON:  Radiographs 08/25/2018 FINDINGS: Right internal jugular dialysis catheter tip in the distal SVC. Unchanged cardiomegaly. Unchanged aortic tortuosity. Minimal vascular congestion without pulmonary edema. No focal airspace disease, large pleural effusion or pneumothorax. No acute osseous abnormalities are seen. IMPRESSION: Stable cardiomegaly. Minimal vascular congestion. Electronically Signed   By: Keith Rake M.D.   On: 09/16/2018 01:57   Ct Head Code Stroke Wo Contrast  Result Date: 09/16/2018 CLINICAL DATA:  Code stroke. RIGHT facial droop. Aphasic. History of multiple myeloma, hypertension. EXAM: CT HEAD WITHOUT CONTRAST TECHNIQUE: Contiguous axial images were obtained from the base of the skull through the vertex without intravenous contrast. COMPARISON:  CT HEAD August 25, 2018 and MRI head April 16, 2016 FINDINGS: Mild motion degraded examination. BRAIN: No intraparenchymal hemorrhage, mass effect nor midline shift. The ventricles and sulci are normal for age. 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 Old small LEFT cerebellar infarct. Patchy supratentorial white matter hypodensities. No acute large vascular territory infarcts. No abnormal extra-axial fluid collections. Basal cisterns are patent. VASCULAR: Mild calcific atherosclerosis of the carotid siphons. SKULL: No skull fracture. Multiple soft tissue masses within the calvarium including LEFT sphenoid eroding the inner and outer tables. No significant scalp soft tissue swelling. SINUSES/ORBITS: Mild paranasal sinus mucosal thickening. Mastoid air cells are well aerated. Soft tissue within the external auditory canals most compatible with cerumen.The included ocular globes and orbital  contents are non-suspicious. OTHER: None. ASPECTS [facility] Stroke Program Early CT Score) - Ganglionic level infarction (caudate, lentiform nuclei, internal capsule, insula, M1-M3 cortex): 7 - Supraganglionic infarction (M4-M6 cortex): 3 Total score (0-10 with 10 being normal): 10 IMPRESSION: 1. Mild motion degraded examination.  No acute intracranial process. 2. ASPECTS is 10. 3. Mild chronic small vessel ischemic changes. 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 4. Multiple osseous lesions consistent with known multiple myeloma. 5. Critical Value/emergent results text paged to [place] via AMION secure system on 09/16/2018 at 12:18 am, including interpreting physician's phone number. Electronically Signed   By: Elon Alas M.D.   On: 09/16/2018 00:18     LOS: 7 days   Oren Binet, MD  [facility]  If 7PM-7AM, please contact night-coverage  Please page via [email or web address] on MD name and type text message  10/10/2018, 1:16 PM

## 2018-10-11 LAB — TYPE AND SCREEN
ABO/RH(D): O POS
Antibody Screen: NEGATIVE
Unit division: 0

## 2018-10-11 LAB — CBC
HCT: 25.6 % — ABNORMAL LOW (ref 36.0–46.0)
Hemoglobin: 7.8 g/dL — ABNORMAL LOW (ref 12.0–15.0)
MCH: 31.5 pg (ref 26.0–34.0)
MCHC: 30.5 g/dL (ref 30.0–36.0)
MCV: 103.2 fL — ABNORMAL HIGH (ref 80.0–100.0)
NRBC: 0 % (ref 0.0–0.2)
Platelets: 200 10*3/uL (ref 150–400)
RBC: 2.48 MIL/uL — AB (ref 3.87–5.11)
RDW: 18.6 % — ABNORMAL HIGH (ref 11.5–15.5)
WBC: 4.1 10*3/uL (ref 4.0–10.5)

## 2018-10-11 LAB — RENAL FUNCTION PANEL
Albumin: 1.9 g/dL — ABNORMAL LOW (ref 3.5–5.0)
Anion gap: 6 (ref 5–15)
BUN: 21 mg/dL (ref 8–23)
CHLORIDE: 95 mmol/L — AB (ref 98–111)
CO2: 27 mmol/L (ref 22–32)
Calcium: 9.1 mg/dL (ref 8.9–10.3)
Creatinine, Ser: 3.79 mg/dL — ABNORMAL HIGH (ref 0.44–1.00)
GFR calc Af Amer: 12 mL/min — ABNORMAL LOW (ref >60)
GFR calc non Af Amer: 11 mL/min — ABNORMAL LOW (ref >60)
Glucose, Bld: 87 mg/dL (ref 70–99)
POTASSIUM: 3.5 mmol/L (ref 3.5–5.1)
Phosphorus: 2.5 mg/dL (ref 2.5–4.6)
Sodium: 128 mmol/L — ABNORMAL LOW (ref 135–145)

## 2018-10-11 LAB — CULTURE, BLOOD (ROUTINE X 2)
Culture: NO GROWTH
Culture: NO GROWTH
Special Requests: ADEQUATE
Special Requests: ADEQUATE

## 2018-10-11 LAB — BPAM RBC
Blood Product Expiration Date: 202002092359
ISSUE DATE / TIME: 202001101519
UNIT TYPE AND RH: 5100

## 2018-10-11 MED ORDER — DARBEPOETIN ALFA 200 MCG/0.4ML IJ SOSY
200.0000 ug | PREFILLED_SYRINGE | INTRAMUSCULAR | Status: DC
  Filled 2018-10-11: qty 0.4

## 2018-10-11 NOTE — Progress Notes (Addendum)
[facility] Progress Note   Subjective:   Seen and examined at bedside.  Less confused today, oriented to place and day.  States dialysis went ok yesterday.  Not thrilled about having it again today, but agrees to go.   Objective Vitals:   10/10/18 1749 10/10/18 2123 10/10/18 2219 10/11/18 0427  BP: 132/89 (!) 178/154 129/79 128/79  Pulse: (!) 55 (!) 52 (!) 57 (!) 48  Resp: _0 Temp: 98.5 F (36.9 C) 98.9 F (37.2 C)  98.3 F (36.8 C)  TempSrc: Oral Oral  Oral  SpO2: 100% 100%  100%  Weight:      Height:       Physical Exam General:NAD, chronically ill appearing, elderly, frail female Heart:RRR, no mrg Lungs:CTAB, nml WOB Abdomen:soft, NTND Extremities:no LE edema Dialysis Access: R [facility]   Filed Weights   10/07/18 1155 10/10/18 1340 10/10/18 1715  Weight: 61 kg 54 kg 51 kg    Intake/Output Summary (Last 24 hours) at 10/11/2018 0903 Last data filed at 10/10/2018 1800 Gross per 24 hour  Intake 669.03 ml  Output 3005 ml  Net -2335.97 ml    Additional Objective Labs: Basic Metabolic Panel: Recent Labs  Lab 10/09/18 0401 10/10/18 1441 10/11/18 0336  NA 131* 131* 128*  K 4.1 4.6 3.5  CL 97* 96* 95*  CO2 _1 GLUCOSE 75 76 87  BUN 38* 55* 21  CREATININE 5.56* 7.34* 3.79*  CALCIUM 9.1 9.0 9.1  PHOS 3.4 3.3 2.5   Liver Function Tests: Recent Labs  Lab 10/09/18 0401 10/10/18 1441 10/11/18 0336  ALBUMIN 1.7* 1.8* 1.9*   CBC: Recent Labs  Lab 10/07/18 0540 10/08/18 0423 10/09/18 0401 10/10/18 1441 10/10/18 1841 10/11/18 0336  WBC 5.3 3.6* 3.3* 3.4* 3.5* 4.1  NEUTROABS 4.0 2.6 2.5  --   --   --   HGB 8.3* 7.0* 5.9* 5.9* 8.7* 7.8*  HCT 27.6* 23.2* 20.4* 20.6* 28.2* 25.6*  MCV 105.7* 104.0* 106.3* 107.3* 102.5* 103.2*  PLT 232 189 183 195 215 200   Medications: .  ceFAZolin (ANCEF) IV Stopped (10/10/18 1635)  . cefTAZidime (FORTAZ)  IV     . sodium chloride   Intravenous Once  . acetaminophen  650 mg Oral Once  .  amLODipine  10 mg Oral QPM  . Chlorhexidine Gluconate Cloth  6 each Topical Q0600  . Chlorhexidine Gluconate Cloth  6 each Topical Q0600  . cloNIDine  0.2 mg Transdermal Weekly  . famotidine  20 mg Oral Daily  . isosorbide mononitrate  60 mg Oral Daily  . lamoTRIgine  50 mg Oral Daily  . latanoprost  1 drop Both Eyes QHS  . LORazepam  0.5 mg Oral QHS  . losartan  50 mg Oral QPM    Dialysis Orders: AF TTS 3.5h 425/1.5x EDW 54kg 2K/2.25Ca  TDC No heparin bolus Hectorol 2 mcg IV TIW Retacrit 20000 U IV TIW  Assessment/Plan: 1.Acute encephalopathy - Waxing and waning confusion/ agitation and psychosis. Dementia most likely.  2. MSSA and GNR Bacteremia - changed to ceftazidime &cefazolin. Rechecking BC - NGTD. ID following. No endocarditis work up d/t comorbidities. Not catheter exchange candidate given comorbidities.  3. ESRD -On HD TTS. K 3.5. HD yesterday tolerated well.  Orders written for HD again today per regular schedule. Family has changed their mind about withdrawal from HD. 4. Anemia of CKD-Hgb^7.8 s/p 1Unit pRBC, daughter signed consent for blood products. No iron due to high ferritin.  Give aranesp 241mg qwk (Sat) 5. Secondary hyperparathyroidism -Caand phosat goal. Continue VDRA/binders.  6. HTN/volume -BPin goal. Continue meds. Does not appear volume overloaded on exam. 7. Nutrition -Alb 1.7. Renal diet w/fluid restrictions. Vitamins. Prostat.  [facility]. F[facility] - seen by palliative care. Appreciate their help.   LJen Mow PA-C [facility] Pager: [phone]/08/2019,9:03 AM  LOS: 8 days   Pt seen, examined and agree w A/P as above.  RKelly SplinterMD CNewell Rubbermaidpager [phone]  10/11/2018, 12:46 PM

## 2018-10-11 NOTE — Progress Notes (Signed)
Pt daughter call and requested for her mother not to have dialysis at this time. Daughter also want Dr. Sloan Leiter to give her a call.

## 2018-10-11 NOTE — Progress Notes (Signed)
Pt daughter will Dr. Sloan Leiter to give her a call about mother care. Rely message to Dr. Sloan Leiter and upcoming nurse.

## 2018-10-11 NOTE — Progress Notes (Signed)
Rosario Jacks, RN called and made aware that this HD tx has been moved to Sunday 10/12/2018 per Nephrologist.

## 2018-10-11 NOTE — Progress Notes (Signed)
PROGRESS NOTE        PATIENT DETAILS Name: [name] Age: 78 y.o. Sex: female Date of Birth: 25-Apr-1941 Admit Date: 10/03/2018 Admitting Physician Kayleen Memos, DO HOZ:[id number], Darrick Penna, MD  Brief Narrative: Patient is a 78 y.o. female history of ESRD on HD TTS, multiple myeloma, dementia, chronic diastolic heart failure, chronic hyponatremia-ambulatory dysfunction-bed to wheelchair bound-presented with worsening confusion-thought to be secondary to hypertensive encephalopathy and recurrent bacteremia.  See below for further details  Subjective: Pleasantly confused this morning-seems somewhat less confused in the past few days.  Assessment/Plan: Acute metabolic encephalopathy: Confusion waxes and wanes-less confused today.  Encephalopathy thought to be secondary from bacteremia and hypertensive encephalopathy.  CT head on 1/4 was without any acute abnormalities.   MSSA/Achromobacter bacteremia: Afebrile-nontoxic appearing-continue cefazolin and ceftazidime.  ID recommending continuing antibiotics with stop date of 11/03/2018.  Hypertensive emergency: BP controlled-continue amlodipine, clonidine, Imdur, losartan.  Follow and adjust accordingly.   Anemia: Secondary to ESRD/multiple myeloma-perhaps worsened by acute illness.  1 unit of PRBC transfused on 1/10.  Hemoglobin at 7.8.  Follow CBC periodically.   ESRD: HD TTS-nephrology following and directing care.    Chronic diastolic heart failure: Volume management with HD  Chronic hyponatremia: Appears mild-managed with HD  Dysphagia: Seen by speech therapy-continue dysphagia 1 diet with full aspiration precautions.  Moderate pulmonary hypertension: Supportive care  Failure to thrive syndrome/debility/deconditioning/palliative care: DNR in place-poor overall prognosis-continues to have recurrent bacteremia and failure to thrive syndrome.  Bacteremia is felt to be probably related to HD catheter-nephrology  recommending not to exchange the catheter given severe comorbidities.  After extensive discussion with the family on 1/9-patient was transitioned to comfort measures-however on 1/10 patient's family/daughter changed her mind-DNR in place-we are continuing dialysis and antimicrobial therapy.  DVT Prophylaxis: None  Code Status: DNR  Family Communication: None at [place] message for patient's daughter over the phone  Disposition Plan: Remain inpatient-back to SNF over the next few days  Antimicrobial agents: Anti-infectives (From admission, onward)   Start     Dose/Rate Route Frequency Ordered Stop   10/11/18 1800  cefTAZidime (FORTAZ) 2 g in sodium chloride 0.9 % 100 mL IVPB     2 g 200 mL/hr over 30 Minutes Intravenous Every T-Th-Sa (1800) 10/10/18 1434     10/10/18 1800  ceFAZolin (ANCEF) IVPB 2g/100 mL premix  Status:  Discontinued     2 g 200 mL/hr over 30 Minutes Intravenous Every T-Th-Sa (Hemodialysis) 10/10/18 1416 10/10/18 1434   10/10/18 1800  cefTAZidime (FORTAZ) 2 g in sodium chloride 0.9 % 100 mL IVPB  Status:  Discontinued     2 g 200 mL/hr over 30 Minutes Intravenous Every T-Th-Sa (Hemodialysis) 10/10/18 1416 10/10/18 1434   10/10/18 1800  ceFAZolin (ANCEF) IVPB 2g/100 mL premix     2 g 200 mL/hr over 30 Minutes Intravenous Every T-Th-Sa (1800) 10/10/18 1434     10/10/18 1800  cefTAZidime (FORTAZ) 2 g in sodium chloride 0.9 % 100 mL IVPB     2 g 200 mL/hr over 30 Minutes Intravenous  Once 10/10/18 1616 10/10/18 1859   10/07/18 1200  ceFAZolin (ANCEF) IVPB 2g/100 mL premix  Status:  Discontinued     2 g 200 mL/hr over 30 Minutes Intravenous Every T-Th-Sa (Hemodialysis) 10/06/18 1004 10/09/18 1422   10/07/18 1200  cefTAZidime (FORTAZ) 2 g in sodium chloride  0.9 % 100 mL IVPB  Status:  Discontinued     2 g 200 mL/hr over 30 Minutes Intravenous Every T-Th-Sa (Hemodialysis) 10/06/18 1004 10/09/18 1422   10/06/18 1015  cefTAZidime (FORTAZ) 2 g in sodium chloride 0.9  % 100 mL IVPB     2 g 200 mL/hr over 30 Minutes Intravenous  Once 10/06/18 1004 10/06/18 1222   10/04/18 2000  ceFEPIme (MAXIPIME) 1 g in sodium chloride 0.9 % 100 mL IVPB  Status:  Discontinued     1 g 200 mL/hr over 30 Minutes Intravenous Every 24 hours 10/04/18 1829 10/06/18 1004   10/04/18 1800  ceFAZolin (ANCEF) IVPB 2g/100 mL premix  Status:  Discontinued     2 g 200 mL/hr over 30 Minutes Intravenous Every T-Th-Sa (1800) 10/04/18 1313 10/04/18 1829   10/03/18 1400  valACYclovir (VALTREX) tablet 500 mg  Status:  Discontinued     500 mg Oral Every other day 10/03/18 0852 10/09/18 1422   10/03/18 1330  piperacillin-tazobactam (ZOSYN) IVPB 3.375 g  Status:  Discontinued     3.375 g 12.5 mL/hr over 240 Minutes Intravenous Every 12 hours 10/03/18 1317 10/04/18 1255   10/03/18 1315  piperacillin-tazobactam (ZOSYN) injection 2.25 g  Status:  Discontinued     2.25 g Intramuscular Every 12 hours 10/03/18 1305 10/03/18 1316      Procedures: None  CONSULTS:  nephrology and Palliative care, ID  Time spent: 25 minutes-Greater than 50% of this time was spent in counseling, explanation of diagnosis, planning of further management, and coordination of care.  MEDICATIONS: Scheduled Meds: . sodium chloride   Intravenous Once  . acetaminophen  650 mg Oral Once  . amLODipine  10 mg Oral QPM  . Chlorhexidine Gluconate Cloth  6 each Topical Q0600  . Chlorhexidine Gluconate Cloth  6 each Topical Q0600  . cloNIDine  0.2 mg Transdermal Weekly  . darbepoetin (ARANESP) injection - DIALYSIS  200 mcg Intravenous Q Sat-HD  . famotidine  20 mg Oral Daily  . isosorbide mononitrate  60 mg Oral Daily  . lamoTRIgine  50 mg Oral Daily  . latanoprost  1 drop Both Eyes QHS  . LORazepam  0.5 mg Oral QHS  . losartan  50 mg Oral QPM   Continuous Infusions: .  ceFAZolin (ANCEF) IV Stopped (10/10/18 1635)  . cefTAZidime (FORTAZ)  IV     PRN Meds:.acetaminophen **OR** acetaminophen, antiseptic oral rinse,  hydrALAZINE, HYDROmorphone (DILAUDID) injection, LORazepam **OR** LORazepam **OR** LORazepam, LORazepam, polyvinyl alcohol   PHYSICAL EXAM: Vital signs: Vitals:   10/10/18 2123 10/10/18 2219 10/11/18 0427 10/11/18 1236  BP: (!) 178/154 129/79 128/79 119/78  Pulse: (!) 52 (!) 57 (!) 48 (!) 45  Resp: 16  16   Temp: 98.9 F (37.2 C)  98.3 F (36.8 C)   TempSrc: Oral  Oral   SpO2: 100%  100%   Weight:      Height:       Filed Weights   10/07/18 1155 10/10/18 1340 10/10/18 1715  Weight: 61 kg 54 kg 51 kg   Body mass index is 18.15 kg/m.   General appearance:Awake, pleasantly confused Eyes:no scleral icterus. HEENT: Atraumatic and Normocephalic Neck: supple, no JVD. Resp:Good air entry bilaterally,no rales or rhonchi CVS: S1 S2 regular, no murmurs.  GI: Bowel sounds present, Non tender and not distended with no gaurding, rigidity or rebound. Extremities: B/L Lower Ext shows no edema, both legs are warm to touch Neurology:  Non focal    I have personally  reviewed following labs and imaging studies  LABORATORY DATA: CBC: Recent Labs  Lab 10/05/18 0300  10/06/18 0349 10/07/18 0540 10/08/18 0423 10/09/18 0401 10/10/18 1441 10/10/18 1841 10/11/18 0336  WBC 3.9*  --  4.0 5.3 3.6* 3.3* 3.4* 3.5* 4.1  NEUTROABS 2.7  --  2.6 4.0 2.6 2.5  --   --   --   HGB 7.5*  --  7.0* 8.3* 7.0* 5.9* 5.9* 8.7* 7.8*  HCT 25.3*   < > 23.6* 27.6* 23.2* 20.4* 20.6* 28.2* 25.6*  MCV 104.5*  --  105.8* 105.7* 104.0* 106.3* 107.3* 102.5* 103.2*  PLT 164  --  179 232 189 183 195 215 200   < > = values in this interval not displayed.    Basic Metabolic Panel: Recent Labs  Lab 10/07/18 0540 10/08/18 0423 10/09/18 0401 10/10/18 1441 10/11/18 0336  NA 129* 129* 131* 131* 128*  K 4.0 3.7 4.1 4.6 3.5  CL 95* 96* 97* 96* 95*  CO2 21* _0 GLUCOSE 95 76 75 76 87  BUN 55* 22 38* 55* 21  CREATININE 7.39* 3.96* 5.56* 7.34* 3.79*  CALCIUM 9.5 8.7* 9.1 9.0 9.1  PHOS 3.0  --  3.4 3.3  2.5    GFR: Estimated Creatinine Clearance: 9.7 mL/min (A) (by C-G formula based on SCr of 3.79 mg/dL (H)).  Liver Function Tests: Recent Labs  Lab 10/09/18 0401 10/10/18 1441 10/11/18 0336  ALBUMIN 1.7* 1.8* 1.9*   No results for input(s): LIPASE, AMYLASE in the last 168 hours. No results for input(s): AMMONIA in the last 168 hours.  Coagulation Profile: No results for input(s): INR, PROTIME in the last 168 hours.  Cardiac Enzymes: No results for input(s): CKTOTAL, CKMB, CKMBINDEX, TROPONINI in the last 168 hours.  BNP (last 3 results) No results for input(s): PROBNP in the last 8760 hours.  HbA1C: No results for input(s): HGBA1C in the last 72 hours.  CBG: Recent Labs  Lab 10/09/18 1733  GLUCAP 93    Lipid Profile: No results for input(s): CHOL, HDL, LDLCALC, TRIG, CHOLHDL, LDLDIRECT in the last 72 hours.  Thyroid Function Tests: No results for input(s): TSH, T4TOTAL, FREET4, T3FREE, THYROIDAB in the last 72 hours.  Anemia Panel: No results for input(s): VITAMINB12, FOLATE, FERRITIN, TIBC, IRON, RETICCTPCT in the last 72 hours.  Urine analysis:    Component Value Date/Time   COLORURINE STRAW (A) 09/16/2018 0326   APPEARANCEUR CLEAR 09/16/2018 0326   LABSPEC 1.010 09/16/2018 0326   PHURINE 9.0 (H) 09/16/2018 0326   GLUCOSEU 50 (A) 09/16/2018 0326   HGBUR NEGATIVE 09/16/2018 0326   BILIRUBINUR NEGATIVE 09/16/2018 0326   KETONESUR 5 (A) 09/16/2018 0326   PROTEINUR 100 (A) 09/16/2018 0326   UROBILINOGEN 0.2 04/10/2015 1800   NITRITE NEGATIVE 09/16/2018 0326   LEUKOCYTESUR NEGATIVE 09/16/2018 0326    Sepsis Labs: Lactic Acid, Venous    Component Value Date/Time   LATICACIDVEN 1.31 09/16/2018 0334    MICROBIOLOGY: Recent Results (from the past 240 hour(s))  Blood culture (routine x 2)     Status: Abnormal   Collection Time: 10/03/18  7:30 AM  Result Value Ref Range Status   Specimen Description BLOOD RIGHT HAND  Final   Special Requests   Final      BOTTLES DRAWN AEROBIC AND ANAEROBIC Blood Culture adequate volume Performed at [facility], [place] [address] [place], [place] [address]    Culture  Setup Time   Final    GRAM NEGATIVE  RODS AEROBIC BOTTLE ONLY CRITICAL RESULT CALLED TO, READ BACK BY AND VERIFIED WITH: PHARMD BEN MANCHERIL [phone] [id number] FCP    Culture ACHROMOBACTER XYLOSOXIDANS (A)  Final   Report Status 10/06/2018 FINAL  Final   Organism ID, Bacteria ACHROMOBACTER XYLOSOXIDANS  Final      Susceptibility   Achromobacter xylosoxidans - MIC*    CEFEPIME 16 INTERMEDIATE Intermediate     CEFAZOLIN >=64 RESISTANT Resistant     GENTAMICIN >=16 RESISTANT Resistant     CIPROFLOXACIN >=4 RESISTANT Resistant     IMIPENEM 8 INTERMEDIATE Intermediate     TRIMETH/SULFA <=20 SENSITIVE Sensitive     CEFTAZIDIME Value in next row Sensitive      SENSITIVE4    PIP/TAZO Value in next row Sensitive      SENSITIVE<=4    * ACHROMOBACTER XYLOSOXIDANS  Blood Culture ID Panel (Reflexed)     Status: None   Collection Time: 10/03/18  7:31 AM  Result Value Ref Range Status   Enterococcus species NOT DETECTED NOT DETECTED Final   Listeria monocytogenes NOT DETECTED NOT DETECTED Final   Staphylococcus species NOT DETECTED NOT DETECTED Final   Staphylococcus aureus (BCID) NOT DETECTED NOT DETECTED Final   Streptococcus species NOT DETECTED NOT DETECTED Final   Streptococcus agalactiae NOT DETECTED NOT DETECTED Final   Streptococcus pneumoniae NOT DETECTED NOT DETECTED Final   Streptococcus pyogenes NOT DETECTED NOT DETECTED Final   Acinetobacter baumannii NOT DETECTED NOT DETECTED Final   Enterobacteriaceae species NOT DETECTED NOT DETECTED Final   Enterobacter cloacae complex NOT DETECTED NOT DETECTED Final   Escherichia coli NOT DETECTED NOT DETECTED Final   Klebsiella oxytoca NOT DETECTED NOT DETECTED Final   Klebsiella pneumoniae NOT DETECTED NOT DETECTED Final   Proteus species NOT DETECTED NOT DETECTED Final   Serratia  marcescens NOT DETECTED NOT DETECTED Final   Haemophilus influenzae NOT DETECTED NOT DETECTED Final   Neisseria meningitidis NOT DETECTED NOT DETECTED Final   Pseudomonas aeruginosa NOT DETECTED NOT DETECTED Final   Candida albicans NOT DETECTED NOT DETECTED Final   Candida glabrata NOT DETECTED NOT DETECTED Final   Candida krusei NOT DETECTED NOT DETECTED Final   Candida parapsilosis NOT DETECTED NOT DETECTED Final   Candida tropicalis NOT DETECTED NOT DETECTED Final  Blood culture (routine x 2)     Status: Abnormal   Collection Time: 10/03/18  7:33 AM  Result Value Ref Range Status   Specimen Description BLOOD RIGHT ANTECUBITAL  Final   Special Requests   Final    BOTTLES DRAWN AEROBIC AND ANAEROBIC Blood Culture results may not be optimal due to an inadequate volume of blood received in culture bottles   Culture  Setup Time   Final    GRAM POSITIVE COCCI AEROBIC BOTTLE ONLY Organism ID to follow CRITICAL RESULT CALLED TO, READ BACK BY AND VERIFIED WITHKarsten Ro PHARMD [id number] 10/04/18 A BROWNING Performed at [facility], [address], [place], [place] [address]    Culture STAPHYLOCOCCUS AUREUS (A)  Final   Report Status 10/06/2018 FINAL  Final   Organism ID, Bacteria STAPHYLOCOCCUS AUREUS  Final      Susceptibility   Staphylococcus aureus - MIC*    CIPROFLOXACIN <=0.5 SENSITIVE Sensitive     ERYTHROMYCIN <=0.25 SENSITIVE Sensitive     GENTAMICIN <=0.5 SENSITIVE Sensitive     OXACILLIN 0.5 SENSITIVE Sensitive     TETRACYCLINE <=1 SENSITIVE Sensitive     VANCOMYCIN <=0.5 SENSITIVE Sensitive     TRIMETH/SULFA <=10 SENSITIVE Sensitive  CLINDAMYCIN <=0.25 SENSITIVE Sensitive     RIFAMPIN <=0.5 SENSITIVE Sensitive     Inducible Clindamycin NEGATIVE Sensitive     * STAPHYLOCOCCUS AUREUS  Blood Culture ID Panel (Reflexed)     Status: Abnormal   Collection Time: 10/03/18  7:33 AM  Result Value Ref Range Status   Enterococcus species NOT DETECTED NOT DETECTED Final    Listeria monocytogenes NOT DETECTED NOT DETECTED Final   Staphylococcus species DETECTED (A) NOT DETECTED Final    Comment: CRITICAL RESULT CALLED TO, READ BACK BY AND VERIFIED WITH: [facility] PHARMD [id number] 10/04/18 A BROWNING    Staphylococcus aureus (BCID) DETECTED (A) NOT DETECTED Final    Comment: Methicillin (oxacillin) susceptible Staphylococcus aureus (MSSA). Preferred therapy is anti staphylococcal beta lactam antibiotic (Cefazolin or Nafcillin), unless clinically contraindicated. CRITICAL RESULT CALLED TO, READ BACK BY AND VERIFIED WITH: Karsten Ro PHARMD 0240 10/04/18 A BROWNING    Methicillin resistance NOT DETECTED NOT DETECTED Final   Streptococcus species NOT DETECTED NOT DETECTED Final   Streptococcus agalactiae NOT DETECTED NOT DETECTED Final   Streptococcus pneumoniae NOT DETECTED NOT DETECTED Final   Streptococcus pyogenes NOT DETECTED NOT DETECTED Final   Acinetobacter baumannii NOT DETECTED NOT DETECTED Final   Enterobacteriaceae species NOT DETECTED NOT DETECTED Final   Enterobacter cloacae complex NOT DETECTED NOT DETECTED Final   Escherichia coli NOT DETECTED NOT DETECTED Final   Klebsiella oxytoca NOT DETECTED NOT DETECTED Final   Klebsiella pneumoniae NOT DETECTED NOT DETECTED Final   Proteus species NOT DETECTED NOT DETECTED Final   Serratia marcescens NOT DETECTED NOT DETECTED Final   Haemophilus influenzae NOT DETECTED NOT DETECTED Final   Neisseria meningitidis NOT DETECTED NOT DETECTED Final   Pseudomonas aeruginosa NOT DETECTED NOT DETECTED Final   Candida albicans NOT DETECTED NOT DETECTED Final   Candida glabrata NOT DETECTED NOT DETECTED Final   Candida krusei NOT DETECTED NOT DETECTED Final   Candida parapsilosis NOT DETECTED NOT DETECTED Final   Candida tropicalis NOT DETECTED NOT DETECTED Final    Comment: Performed at [facility], [place] [address] [place], [place] [address]  MRSA PCR Screening     Status: None   Collection Time: 10/03/18  6:46 PM    Result Value Ref Range Status   MRSA by PCR NEGATIVE NEGATIVE Final    Comment:        The GeneXpert MRSA Assay (FDA approved for NASAL specimens only), is one component of a comprehensive MRSA colonization surveillance program. It is not intended to diagnose MRSA infection nor to guide or monitor treatment for MRSA infections. Performed at [facility], [place] [address] [place], [place] [address]   Culture, blood (Routine X 2) w Reflex to ID Panel     Status: None   Collection Time: 10/04/18  1:18 PM  Result Value Ref Range Status   Specimen Description BLOOD BLOOD LEFT HAND  Final   Special Requests AEROBIC BOTTLE ONLY Blood Culture adequate volume  Final   Culture   Final    NO GROWTH 5 DAYS Performed at [facility], [place] [address], [place], [place] [address]    Report Status 10/09/2018 FINAL  Final  Culture, blood (routine x 2)     Status: None   Collection Time: 10/06/18 11:13 AM  Result Value Ref Range Status   Specimen Description BLOOD RIGHT FOREARM  Final   Special Requests   Final    BOTTLES DRAWN AEROBIC AND ANAEROBIC Blood Culture adequate volume  Culture   Final    NO GROWTH 5 DAYS Performed at [facility], [place] [address], [place], [place] [address]    Report Status 10/11/2018 FINAL  Final  Culture, blood (routine x 2)     Status: None   Collection Time: 10/06/18 11:18 AM  Result Value Ref Range Status   Specimen Description BLOOD RIGHT HAND  Final   Special Requests   Final    BOTTLES DRAWN AEROBIC AND ANAEROBIC Blood Culture adequate volume   Culture   Final    NO GROWTH 5 DAYS Performed at [facility], [place] [address] [place], [place] [address]    Report Status 10/11/2018 FINAL  Final    RADIOLOGY STUDIES/RESULTS: Ct Head Wo Contrast  Result Date: 10/04/2018 CLINICAL DATA:  Head trauma, ataxia EXAM: CT HEAD WITHOUT CONTRAST CT CERVICAL SPINE WITHOUT CONTRAST TECHNIQUE: Multidetector CT imaging of the head and cervical  spine was performed following the standard protocol without intravenous contrast. Multiplanar CT image reconstructions of the cervical spine were also generated. COMPARISON:  Head CT yesterday at 0455 hour, brain MRI 09/16/2018. Cervical spine CT 07/26/2018 FINDINGS: CT HEAD FINDINGS Brain: No intracranial hemorrhage, mass effect, or midline shift. No hydrocephalus. The basilar cisterns are patent. No evidence of territorial infarct or acute ischemia. No extra-axial or intracranial fluid collection. Stable degree of atrophy and chronic small vessel ischemia. Remote left cerebellar infarct. Vascular: Atherosclerosis of skullbase vasculature without hyperdense vessel or abnormal calcification. Skull: Innumerable skull lesions consistent with patient's history of myeloma, unchanged. No skull fracture. Sinuses/Orbits: No acute finding. Other: None. CT CERVICAL SPINE FINDINGS Alignment: Trace anterolisthesis of C2 on C3, unchanged. No traumatic subluxation. Skull base and vertebrae: No acute fracture. Multiple small lucencies consistent with multiple myeloma, unchanged from prior exam. No evidence of pathologic fracture. Skull base is intact. Soft tissues and spinal canal: No prevertebral fluid or swelling. No visible canal hematoma. Disc levels: Multilevel degenerative disc disease with disc space narrowing and endplate spurring, most prominent at C3-C4 and C6-C7. Multilevel facet arthropathy. Upper chest: Right internal jugular dialysis catheter. No acute findings. Other: Myelomatous lesions noted in the right clavicle, bilateral scapula,, sternum and multiple ribs. IMPRESSION: 1. No acute intracranial abnormality. No skull fracture. Stable atrophy and chronic small vessel ischemia. 2. Multilevel degenerative change in the cervical spine without acute fracture or subluxation. 3. Multiple osseous lesions in the calvarium and cervical spine consistent with known multiple myeloma. Electronically Signed   By: Keith Rake M.D.   On: 10/04/2018 02:32   Ct Head Wo Contrast  Result Date: 10/03/2018 CLINICAL DATA:  Acute onset of altered mental status. EXAM: CT HEAD WITHOUT CONTRAST TECHNIQUE: Contiguous axial images were obtained from the base of the skull through the vertex without intravenous contrast. COMPARISON:  CT of the head and MRI of the brain performed 09/16/2018 FINDINGS: Brain: No evidence of acute infarction, hemorrhage, hydrocephalus, extra-axial collection or mass lesion / mass effect. Prominence of the ventricles and sulci reflects mild cortical volume loss. Mild cerebellar atrophy is noted. Scattered periventricular and subcortical white matter change likely reflects small vessel ischemic microangiopathy. Chronic ischemic change is noted at the basal ganglia bilaterally. A small chronic lacunar infarct is noted at the left cerebellar hemisphere. The brainstem and fourth ventricle are within normal limits. The cerebral hemispheres demonstrate grossly normal gray-white differentiation. No mass effect or midline shift is seen. Vascular: No hyperdense vessel or unexpected calcification. Skull: There is no evidence of fracture. Numerous large lytic  lesions are noted throughout the visualized osseous structures, measuring up to 2.0 cm in size. These likely reflect the patient's known multiple myeloma. Sinuses/Orbits: The orbits are within normal limits. The paranasal sinuses and mastoid air cells are well-aerated. Other: No significant soft tissue abnormalities are seen. IMPRESSION: 1. No acute intracranial pathology seen on CT. 2. Mild cortical volume loss and scattered small vessel ischemic microangiopathy. 3. Chronic ischemic change at the basal ganglia bilaterally. Small chronic lacunar infarct at the left cerebellar hemisphere. 4. Numerous large lytic lesions throughout the visualized osseous structures, measuring up to 2.0 cm in size. These likely reflect the patient's known multiple myeloma. Electronically  Signed   By: Garald Balding M.D.   On: 10/03/2018 05:11   Ct Cervical Spine Wo Contrast  Result Date: 10/04/2018 CLINICAL DATA:  Head trauma, ataxia EXAM: CT HEAD WITHOUT CONTRAST CT CERVICAL SPINE WITHOUT CONTRAST TECHNIQUE: Multidetector CT imaging of the head and cervical spine was performed following the standard protocol without intravenous contrast. Multiplanar CT image reconstructions of the cervical spine were also generated. COMPARISON:  Head CT yesterday at 0455 hour, brain MRI 09/16/2018. Cervical spine CT 07/26/2018 FINDINGS: CT HEAD FINDINGS Brain: No intracranial hemorrhage, mass effect, or midline shift. No hydrocephalus. The basilar cisterns are patent. No evidence of territorial infarct or acute ischemia. No extra-axial or intracranial fluid collection. Stable degree of atrophy and chronic small vessel ischemia. Remote left cerebellar infarct. Vascular: Atherosclerosis of skullbase vasculature without hyperdense vessel or abnormal calcification. Skull: Innumerable skull lesions consistent with patient's history of myeloma, unchanged. No skull fracture. Sinuses/Orbits: No acute finding. Other: None. CT CERVICAL SPINE FINDINGS Alignment: Trace anterolisthesis of C2 on C3, unchanged. No traumatic subluxation. Skull base and vertebrae: No acute fracture. Multiple small lucencies consistent with multiple myeloma, unchanged from prior exam. No evidence of pathologic fracture. Skull base is intact. Soft tissues and spinal canal: No prevertebral fluid or swelling. No visible canal hematoma. Disc levels: Multilevel degenerative disc disease with disc space narrowing and endplate spurring, most prominent at C3-C4 and C6-C7. Multilevel facet arthropathy. Upper chest: Right internal jugular dialysis catheter. No acute findings. Other: Myelomatous lesions noted in the right clavicle, bilateral scapula,, sternum and multiple ribs. IMPRESSION: 1. No acute intracranial abnormality. No skull fracture. Stable  atrophy and chronic small vessel ischemia. 2. Multilevel degenerative change in the cervical spine without acute fracture or subluxation. 3. Multiple osseous lesions in the calvarium and cervical spine consistent with known multiple myeloma. Electronically Signed   By: Keith Rake M.D.   On: 10/04/2018 02:32   Mr Brain Wo Contrast  Result Date: 09/16/2018 CLINICAL DATA:  Altered mental status and weakness. History of end-stage renal disease on dialysis, multiple myeloma, hypertension. EXAM: MRI HEAD WITHOUT CONTRAST TECHNIQUE: Multiplanar, multiecho pulse sequences of the brain and surrounding structures were obtained without intravenous contrast. COMPARISON:  CT HEAD September 16, 2018 and MRI head April 16, 2018 FINDINGS: INTRACRANIAL CONTENTS: No reduced diffusion to suggest acute ischemia. Numerous infratentorial to lesser extent supratentorial chronic microhemorrhages. Old small LEFT cerebellar infarct. Hazy T2 hyperintense signal bilateral basal ganglia a increased from prior MRI. 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 Patchy supratentorial white matter FLAIR T2 hyperintensities. The ventricles and sulci are normal for patient's age. No suspicious parenchymal signal, masses, mass effect. No abnormal extra-axial fluid collections. No extra-axial masses. VASCULAR: Normal major intracranial vascular flow voids present at skull base. SKULL AND UPPER CERVICAL SPINE: No abnormal sellar expansion. Mildly reduced diffusion with low ADC values of  numerous calvarial metastasis corresponding to known multiple myeloma. Heterogeneous calvarial and cervical spine bone marrow signal consistent with myeloproliferative disease. Craniocervical junction maintained. SINUSES/ORBITS: The mastoid air-cells and included paranasal sinuses are well-aerated.The included ocular globes and orbital contents are non-suspicious. OTHER: Patient is edentulous. IMPRESSION: 1. No acute infarct. 2. Increased basal ganglia  and thalami edema seen with hypertensive encephalopathy, less likely rapidly progressing small vessel ischemic changes. 3. Moderate chronic small vessel ischemic changes. 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 4. Multiple osseous metastasis/multiple myeloma. Electronically Signed   By: Elon Alas M.D.   On: 09/16/2018 05:24   Dg Chest Port 1 View  Result Date: 09/16/2018 CLINICAL DATA:  Altered mental status. EXAM: PORTABLE CHEST 1 VIEW COMPARISON:  Radiographs 08/25/2018 FINDINGS: Right internal jugular dialysis catheter tip in the distal SVC. Unchanged cardiomegaly. Unchanged aortic tortuosity. Minimal vascular congestion without pulmonary edema. No focal airspace disease, large pleural effusion or pneumothorax. No acute osseous abnormalities are seen. IMPRESSION: Stable cardiomegaly. Minimal vascular congestion. Electronically Signed   By: Keith Rake M.D.   On: 09/16/2018 01:57   Ct Head Code Stroke Wo Contrast  Result Date: 09/16/2018 CLINICAL DATA:  Code stroke. RIGHT facial droop. Aphasic. History of multiple myeloma, hypertension. EXAM: CT HEAD WITHOUT CONTRAST TECHNIQUE: Contiguous axial images were obtained from the base of the skull through the vertex without intravenous contrast. COMPARISON:  CT HEAD August 25, 2018 and MRI head April 16, 2016 FINDINGS: Mild motion degraded examination. BRAIN: No intraparenchymal hemorrhage, mass effect nor midline shift. The ventricles and sulci are normal for age. 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 Old small LEFT cerebellar infarct. Patchy supratentorial white matter hypodensities. No acute large vascular territory infarcts. No abnormal extra-axial fluid collections. Basal cisterns are patent. VASCULAR: Mild calcific atherosclerosis of the carotid siphons. SKULL: No skull fracture. Multiple soft tissue masses within the calvarium including LEFT sphenoid eroding the inner and outer tables. No significant  scalp soft tissue swelling. SINUSES/ORBITS: Mild paranasal sinus mucosal thickening. Mastoid air cells are well aerated. Soft tissue within the external auditory canals most compatible with cerumen.The included ocular globes and orbital contents are non-suspicious. OTHER: None. ASPECTS [facility] Stroke Program Early CT Score) - Ganglionic level infarction (caudate, lentiform nuclei, internal capsule, insula, M1-M3 cortex): 7 - Supraganglionic infarction (M4-M6 cortex): 3 Total score (0-10 with 10 being normal): 10 IMPRESSION: 1. Mild motion degraded examination.  No acute intracranial process. 2. ASPECTS is 10. 3. Mild chronic small vessel ischemic changes. 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 4. Multiple osseous lesions consistent with known multiple myeloma. 5. Critical Value/emergent results text paged to [place] via AMION secure system on 09/16/2018 at 12:18 am, including interpreting physician's phone number. Electronically Signed   By: Elon Alas M.D.   On: 09/16/2018 00:18     LOS: 8 days   Oren Binet, MD  [facility]  If 7PM-7AM, please contact night-coverage  Please page via [email or web address]-click on MD name and type text message  10/11/2018, 1:14 PM

## 2018-10-12 LAB — CBC
HCT: 24.4 % — ABNORMAL LOW (ref 36.0–46.0)
Hemoglobin: 7.4 g/dL — ABNORMAL LOW (ref 12.0–15.0)
MCH: 31.9 pg (ref 26.0–34.0)
MCHC: 30.3 g/dL (ref 30.0–36.0)
MCV: 105.2 fL — ABNORMAL HIGH (ref 80.0–100.0)
Platelets: 177 10*3/uL (ref 150–400)
RBC: 2.32 MIL/uL — ABNORMAL LOW (ref 3.87–5.11)
RDW: 17.8 % — ABNORMAL HIGH (ref 11.5–15.5)
WBC: 3.5 10*3/uL — AB (ref 4.0–10.5)
nRBC: 0 % (ref 0.0–0.2)

## 2018-10-12 LAB — RENAL FUNCTION PANEL
Albumin: 1.8 g/dL — ABNORMAL LOW (ref 3.5–5.0)
Anion gap: 6 (ref 5–15)
BUN: 39 mg/dL — ABNORMAL HIGH (ref 8–23)
CO2: 27 mmol/L (ref 22–32)
CREATININE: 5.51 mg/dL — AB (ref 0.44–1.00)
Calcium: 9.2 mg/dL (ref 8.9–10.3)
Chloride: 95 mmol/L — ABNORMAL LOW (ref 98–111)
GFR calc Af Amer: 8 mL/min — ABNORMAL LOW (ref >60)
GFR calc non Af Amer: 7 mL/min — ABNORMAL LOW (ref >60)
Glucose, Bld: 101 mg/dL — ABNORMAL HIGH (ref 70–99)
Phosphorus: 4.2 mg/dL (ref 2.5–4.6)
Potassium: 4.2 mmol/L (ref 3.5–5.1)
Sodium: 128 mmol/L — ABNORMAL LOW (ref 135–145)

## 2018-10-12 NOTE — Progress Notes (Signed)
Dr. Sloan Leiter called daughter, [name]. Daughter still having a lot of questions on mother's condition and situation and uncertain of right decision. Despite Dr. Sloan Leiter speaking with her daughter wanted to talk to Nephrologist to get a better understanding. Dr. Sloan Leiter contacted Nephrologist to make aware. Will continue to monitor for updates.

## 2018-10-12 NOTE — Progress Notes (Signed)
Appreciate nephrology input-no further HD-spoke with daughter this afternoon as well-she seems to be much more in peace with herself compared to few days back.  Explained that since we are stopping hemodialysis-really no role for antimicrobial therapy.  Will consult social work for residential hospice placement.

## 2018-10-12 NOTE — Progress Notes (Signed)
PROGRESS NOTE        PATIENT DETAILS Name: [name] Age: 78 y.o. Sex: female Date of Birth: 08-31-1941 Admit Date: 10/03/2018 Admitting Physician Kayleen Memos, DO MCE:[id number], Darrick Penna, MD  Brief Narrative: Patient is a 78 y.o. female history of ESRD on HD TTS, multiple myeloma, dementia, chronic diastolic heart failure, chronic hyponatremia-ambulatory dysfunction-bed to wheelchair bound-presented with worsening confusion-thought to be secondary to hypertensive encephalopathy and recurrent bacteremia.  See below for further details  Subjective: More alert this morning than the past few days.  Informed by RN earlier this morning that patient's daughter now is thinking of stopping hemodialysis-I spoke with the patient's daughter at length-she is still struggling-and is not sure if she wants to stop dialysis-"do not want it on my conscience- of pulling the plug on my mother"  Assessment/Plan: Acute metabolic encephalopathy: Somewhat better today compared to the past few days-but per nursing staff confusion continues to wax and wane, per daughter-she appears more confused after HD.   Encephalopathy thought to be secondary from bacteremia and hypertensive encephalopathy.  CT head on 1/4 was without any acute abnormalities.   MSSA/Achromobacter bacteremia: Afebrile-ID recommending continue cefazolin and ceftazidime on 11/03/2018.  Even though this is likely related to HD catheter-per nephrology-not a candidate for HD catheter exchange due to frailty and numerous chronic comorbidities.  Hypertensive emergency: BP controlled-bradycardic-stop transdermal clonidine.  Cautiously continue with amlodipine, losartan and Imdur.  Follow and adjust accordingly  Anemia: Secondary to ESRD/multiple myeloma-perhaps worsened by acute illness.  1 unit of PRBC transfused on 1/10.  Hemoglobin slowly again downtrending at 7.4.  May require more transfusion-continue to follow CBC.  ESRD:  Daughter still struggling about whether to continue HD long-term-long discussion with her this morning-she now feels that HD is not benefiting her mother-but is still undecided whether to stop HD.  Dr. Jonnie Finner will talk to her later today.  On HD TTS if family chooses to continue   Chronic diastolic heart failure: Volume management with HD  Chronic hyponatremia: Appears mild-managed with HD  Dysphagia: Seen by speech therapy-continue dysphagia 1 diet with full aspiration precautions.  Moderate pulmonary hypertension: Supportive care  Failure to thrive syndrome/debility/deconditioning/palliative care: DNR in place-poor overall prognosis-continues to have recurrent bacteremia and failure to thrive syndrome.  Bacteremia is felt to be probably related to HD catheter-nephrology recommending not to exchange the catheter given severe comorbidities.  Mental status continues to wax and wane.  On 1/9-after patient's daughter agreed-patient was transition to comfort measures-however on 1/10-family changed her mind.  Long discussion with the daughter again today-she is now again contemplating stopping the HD-wants to discuss with nephrologist.   DVT Prophylaxis: None  Code Status: DNR  Family Communication: Daughter over the phone  Disposition Plan: Remain inpatient-back to SNF over the next few days  Antimicrobial agents: Anti-infectives (From admission, onward)   Start     Dose/Rate Route Frequency Ordered Stop   10/11/18 1800  cefTAZidime (FORTAZ) 2 g in sodium chloride 0.9 % 100 mL IVPB     2 g 200 mL/hr over 30 Minutes Intravenous Every T-Th-Sa (1800) 10/10/18 1434     10/10/18 1800  ceFAZolin (ANCEF) IVPB 2g/100 mL premix  Status:  Discontinued     2 g 200 mL/hr over 30 Minutes Intravenous Every T-Th-Sa (Hemodialysis) 10/10/18 1416 10/10/18 1434   10/10/18 1800  cefTAZidime (FORTAZ) 2  g in sodium chloride 0.9 % 100 mL IVPB  Status:  Discontinued     2 g 200 mL/hr over 30 Minutes  Intravenous Every T-Th-Sa (Hemodialysis) 10/10/18 1416 10/10/18 1434   10/10/18 1800  ceFAZolin (ANCEF) IVPB 2g/100 mL premix     2 g 200 mL/hr over 30 Minutes Intravenous Every T-Th-Sa (1800) 10/10/18 1434     10/10/18 1800  cefTAZidime (FORTAZ) 2 g in sodium chloride 0.9 % 100 mL IVPB     2 g 200 mL/hr over 30 Minutes Intravenous  Once 10/10/18 1616 10/10/18 1859   10/07/18 1200  ceFAZolin (ANCEF) IVPB 2g/100 mL premix  Status:  Discontinued     2 g 200 mL/hr over 30 Minutes Intravenous Every T-Th-Sa (Hemodialysis) 10/06/18 1004 10/09/18 1422   10/07/18 1200  cefTAZidime (FORTAZ) 2 g in sodium chloride 0.9 % 100 mL IVPB  Status:  Discontinued     2 g 200 mL/hr over 30 Minutes Intravenous Every T-Th-Sa (Hemodialysis) 10/06/18 1004 10/09/18 1422   10/06/18 1015  cefTAZidime (FORTAZ) 2 g in sodium chloride 0.9 % 100 mL IVPB     2 g 200 mL/hr over 30 Minutes Intravenous  Once 10/06/18 1004 10/06/18 1222   10/04/18 2000  ceFEPIme (MAXIPIME) 1 g in sodium chloride 0.9 % 100 mL IVPB  Status:  Discontinued     1 g 200 mL/hr over 30 Minutes Intravenous Every 24 hours 10/04/18 1829 10/06/18 1004   10/04/18 1800  ceFAZolin (ANCEF) IVPB 2g/100 mL premix  Status:  Discontinued     2 g 200 mL/hr over 30 Minutes Intravenous Every T-Th-Sa (1800) 10/04/18 1313 10/04/18 1829   10/03/18 1400  valACYclovir (VALTREX) tablet 500 mg  Status:  Discontinued     500 mg Oral Every other day 10/03/18 0852 10/09/18 1422   10/03/18 1330  piperacillin-tazobactam (ZOSYN) IVPB 3.375 g  Status:  Discontinued     3.375 g 12.5 mL/hr over 240 Minutes Intravenous Every 12 hours 10/03/18 1317 10/04/18 1255   10/03/18 1315  piperacillin-tazobactam (ZOSYN) injection 2.25 g  Status:  Discontinued     2.25 g Intramuscular Every 12 hours 10/03/18 1305 10/03/18 1316      Procedures: None  CONSULTS:  nephrology and Palliative care, ID  Time spent: 25 minutes-Greater than 50% of this time was spent in counseling,  explanation of diagnosis, planning of further management, and coordination of care.  MEDICATIONS: Scheduled Meds: . sodium chloride   Intravenous Once  . acetaminophen  650 mg Oral Once  . amLODipine  10 mg Oral QPM  . Chlorhexidine Gluconate Cloth  6 each Topical Q0600  . Chlorhexidine Gluconate Cloth  6 each Topical Q0600  . cloNIDine  0.2 mg Transdermal Weekly  . darbepoetin (ARANESP) injection - DIALYSIS  200 mcg Intravenous Q Sat-HD  . famotidine  20 mg Oral Daily  . isosorbide mononitrate  60 mg Oral Daily  . lamoTRIgine  50 mg Oral Daily  . latanoprost  1 drop Both Eyes QHS  . LORazepam  0.5 mg Oral QHS  . losartan  50 mg Oral QPM   Continuous Infusions: .  ceFAZolin (ANCEF) IV Stopped (10/10/18 1635)  . cefTAZidime (FORTAZ)  IV     PRN Meds:.acetaminophen **OR** acetaminophen, antiseptic oral rinse, hydrALAZINE, HYDROmorphone (DILAUDID) injection, LORazepam **OR** LORazepam **OR** LORazepam, LORazepam, polyvinyl alcohol   PHYSICAL EXAM: Vital signs: Vitals:   10/11/18 1543 10/11/18 2218 10/12/18 0252 10/12/18 0509  BP: 119/77 127/81  116/83  Pulse: (!) 45 (!) 43  Marland Kitchen)  43  Resp: '16 17  16  ' Temp: 98.1 F (36.7 C) 99.8 F (37.7 C)  98.9 F (37.2 C)  TempSrc: Oral Oral  Oral  SpO2: 100% 100%  100%  Weight:   53 kg   Height:       Filed Weights   10/10/18 1340 10/10/18 1715 10/12/18 0252  Weight: 54 kg 51 kg 53 kg   Body mass index is 18.86 kg/m.   General appearance:Awake, alert, not in any distress.  Confused than yesterday Eyes:no scleral icterus. HEENT: Atraumatic and Normocephalic Neck: supple, no JVD. Resp:Good air entry bilaterally,no rales or rhonchi CVS: S1 S2 regular GI: Bowel sounds present, Non tender and not distended with no gaurding, rigidity or rebound. Extremities: B/L Lower Ext shows no edema, both legs are warm to touch Neurology:  Non focal Musculoskeletal:No digital cyanosis Skin:No Rash, warm and dry Wounds:N/A    I have  personally reviewed following labs and imaging studies  LABORATORY DATA: CBC: Recent Labs  Lab 10/06/18 0349 10/07/18 0540 10/08/18 0423 10/09/18 0401 10/10/18 1441 10/10/18 1841 10/11/18 0336 10/12/18 0455  WBC 4.0 5.3 3.6* 3.3* 3.4* 3.5* 4.1 3.5*  NEUTROABS 2.6 4.0 2.6 2.5  --   --   --   --   HGB 7.0* 8.3* 7.0* 5.9* 5.9* 8.7* 7.8* 7.4*  HCT 23.6* 27.6* 23.2* 20.4* 20.6* 28.2* 25.6* 24.4*  MCV 105.8* 105.7* 104.0* 106.3* 107.3* 102.5* 103.2* 105.2*  PLT 179 232 189 183 195 215 200 997    Basic Metabolic Panel: Recent Labs  Lab 10/07/18 0540 10/08/18 0423 10/09/18 0401 10/10/18 1441 10/11/18 0336 10/12/18 0455  NA 129* 129* 131* 131* 128* 128*  K 4.0 3.7 4.1 4.6 3.5 4.2  CL 95* 96* 97* 96* 95* 95*  CO2 21* '27 28 27 27 27  ' GLUCOSE 95 76 75 76 87 101*  BUN 55* 22 38* 55* 21 39*  CREATININE 7.39* 3.96* 5.56* 7.34* 3.79* 5.51*  CALCIUM 9.5 8.7* 9.1 9.0 9.1 9.2  PHOS 3.0  --  3.4 3.3 2.5 4.2    GFR: Estimated Creatinine Clearance: 6.9 mL/min (A) (by C-G formula based on SCr of 5.51 mg/dL (H)).  Liver Function Tests: Recent Labs  Lab 10/09/18 0401 10/10/18 1441 10/11/18 0336 10/12/18 0455  ALBUMIN 1.7* 1.8* 1.9* 1.8*   No results for input(s): LIPASE, AMYLASE in the last 168 hours. No results for input(s): AMMONIA in the last 168 hours.  Coagulation Profile: No results for input(s): INR, PROTIME in the last 168 hours.  Cardiac Enzymes: No results for input(s): CKTOTAL, CKMB, CKMBINDEX, TROPONINI in the last 168 hours.  BNP (last 3 results) No results for input(s): PROBNP in the last 8760 hours.  HbA1C: No results for input(s): HGBA1C in the last 72 hours.  CBG: Recent Labs  Lab 10/09/18 1733  GLUCAP 93    Lipid Profile: No results for input(s): CHOL, HDL, LDLCALC, TRIG, CHOLHDL, LDLDIRECT in the last 72 hours.  Thyroid Function Tests: No results for input(s): TSH, T4TOTAL, FREET4, T3FREE, THYROIDAB in the last 72 hours.  Anemia Panel: No  results for input(s): VITAMINB12, FOLATE, FERRITIN, TIBC, IRON, RETICCTPCT in the last 72 hours.  Urine analysis:    Component Value Date/Time   COLORURINE STRAW (A) 09/16/2018 0326   APPEARANCEUR CLEAR 09/16/2018 0326   LABSPEC 1.010 09/16/2018 0326   PHURINE 9.0 (H) 09/16/2018 0326   GLUCOSEU 50 (A) 09/16/2018 0326   HGBUR NEGATIVE 09/16/2018 0326   BILIRUBINUR NEGATIVE 09/16/2018 0326   KETONESUR 5 (  A) 09/16/2018 0326   PROTEINUR 100 (A) 09/16/2018 0326   UROBILINOGEN 0.2 04/10/2015 1800   NITRITE NEGATIVE 09/16/2018 0326   LEUKOCYTESUR NEGATIVE 09/16/2018 0326    Sepsis Labs: Lactic Acid, Venous    Component Value Date/Time   LATICACIDVEN 1.31 09/16/2018 0334    MICROBIOLOGY: Recent Results (from the past 240 hour(s))  Blood culture (routine x 2)     Status: Abnormal   Collection Time: 10/03/18  7:30 AM  Result Value Ref Range Status   Specimen Description BLOOD RIGHT HAND  Final   Special Requests   Final    BOTTLES DRAWN AEROBIC AND ANAEROBIC Blood Culture adequate volume Performed at [facility], [place] [address], [place], [place] [address]    Culture  Setup Time   Final    GRAM NEGATIVE RODS AEROBIC BOTTLE ONLY CRITICAL RESULT CALLED TO, READ BACK BY AND VERIFIED WITH: PHARMD BEN MANCHERIL [phone] [id number] FCP    Culture ACHROMOBACTER XYLOSOXIDANS (A)  Final   Report Status 10/06/2018 FINAL  Final   Organism ID, Bacteria ACHROMOBACTER XYLOSOXIDANS  Final      Susceptibility   Achromobacter xylosoxidans - MIC*    CEFEPIME 16 INTERMEDIATE Intermediate     CEFAZOLIN >=64 RESISTANT Resistant     GENTAMICIN >=16 RESISTANT Resistant     CIPROFLOXACIN >=4 RESISTANT Resistant     IMIPENEM 8 INTERMEDIATE Intermediate     TRIMETH/SULFA <=20 SENSITIVE Sensitive     CEFTAZIDIME Value in next row Sensitive      SENSITIVE4    PIP/TAZO Value in next row Sensitive      SENSITIVE<=4    * ACHROMOBACTER XYLOSOXIDANS  Blood Culture ID Panel (Reflexed)     Status: None    Collection Time: 10/03/18  7:31 AM  Result Value Ref Range Status   Enterococcus species NOT DETECTED NOT DETECTED Final   Listeria monocytogenes NOT DETECTED NOT DETECTED Final   Staphylococcus species NOT DETECTED NOT DETECTED Final   Staphylococcus aureus (BCID) NOT DETECTED NOT DETECTED Final   Streptococcus species NOT DETECTED NOT DETECTED Final   Streptococcus agalactiae NOT DETECTED NOT DETECTED Final   Streptococcus pneumoniae NOT DETECTED NOT DETECTED Final   Streptococcus pyogenes NOT DETECTED NOT DETECTED Final   Acinetobacter baumannii NOT DETECTED NOT DETECTED Final   Enterobacteriaceae species NOT DETECTED NOT DETECTED Final   Enterobacter cloacae complex NOT DETECTED NOT DETECTED Final   Escherichia coli NOT DETECTED NOT DETECTED Final   Klebsiella oxytoca NOT DETECTED NOT DETECTED Final   Klebsiella pneumoniae NOT DETECTED NOT DETECTED Final   Proteus species NOT DETECTED NOT DETECTED Final   Serratia marcescens NOT DETECTED NOT DETECTED Final   Haemophilus influenzae NOT DETECTED NOT DETECTED Final   Neisseria meningitidis NOT DETECTED NOT DETECTED Final   Pseudomonas aeruginosa NOT DETECTED NOT DETECTED Final   Candida albicans NOT DETECTED NOT DETECTED Final   Candida glabrata NOT DETECTED NOT DETECTED Final   Candida krusei NOT DETECTED NOT DETECTED Final   Candida parapsilosis NOT DETECTED NOT DETECTED Final   Candida tropicalis NOT DETECTED NOT DETECTED Final  Blood culture (routine x 2)     Status: Abnormal   Collection Time: 10/03/18  7:33 AM  Result Value Ref Range Status   Specimen Description BLOOD RIGHT ANTECUBITAL  Final   Special Requests   Final    BOTTLES DRAWN AEROBIC AND ANAEROBIC Blood Culture results may not be optimal due to an inadequate volume of blood received in culture bottles   Culture  Setup Time   Final    GRAM POSITIVE COCCI AEROBIC BOTTLE ONLY Organism ID to follow CRITICAL RESULT CALLED TO, READ BACK BY AND VERIFIED WITHKarsten Ro  [facility] [id number] 10/04/18 A BROWNING Performed at [facility], [place] [address], [place], [place] [address]    Culture STAPHYLOCOCCUS AUREUS (A)  Final   Report Status 10/06/2018 FINAL  Final   Organism ID, Bacteria STAPHYLOCOCCUS AUREUS  Final      Susceptibility   Staphylococcus aureus - MIC*    CIPROFLOXACIN <=0.5 SENSITIVE Sensitive     ERYTHROMYCIN <=0.25 SENSITIVE Sensitive     GENTAMICIN <=0.5 SENSITIVE Sensitive     OXACILLIN 0.5 SENSITIVE Sensitive     TETRACYCLINE <=1 SENSITIVE Sensitive     VANCOMYCIN <=0.5 SENSITIVE Sensitive     TRIMETH/SULFA <=10 SENSITIVE Sensitive     CLINDAMYCIN <=0.25 SENSITIVE Sensitive     RIFAMPIN <=0.5 SENSITIVE Sensitive     Inducible Clindamycin NEGATIVE Sensitive     * STAPHYLOCOCCUS AUREUS  Blood Culture ID Panel (Reflexed)     Status: Abnormal   Collection Time: 10/03/18  7:33 AM  Result Value Ref Range Status   Enterococcus species NOT DETECTED NOT DETECTED Final   Listeria monocytogenes NOT DETECTED NOT DETECTED Final   Staphylococcus species DETECTED (A) NOT DETECTED Final    Comment: CRITICAL RESULT CALLED TO, READ BACK BY AND VERIFIED WITH: J [facility] PHARMD 0240 10/04/18 A BROWNING    Staphylococcus aureus (BCID) DETECTED (A) NOT DETECTED Final    Comment: Methicillin (oxacillin) susceptible Staphylococcus aureus (MSSA). Preferred therapy is anti staphylococcal beta lactam antibiotic (Cefazolin or Nafcillin), unless clinically contraindicated. CRITICAL RESULT CALLED TO, READ BACK BY AND VERIFIED WITH: Karsten Ro PHARMD 0240 10/04/18 A BROWNING    Methicillin resistance NOT DETECTED NOT DETECTED Final   Streptococcus species NOT DETECTED NOT DETECTED Final   Streptococcus agalactiae NOT DETECTED NOT DETECTED Final   Streptococcus pneumoniae NOT DETECTED NOT DETECTED Final   Streptococcus pyogenes NOT DETECTED NOT DETECTED Final   Acinetobacter baumannii NOT DETECTED NOT DETECTED Final   Enterobacteriaceae species NOT DETECTED NOT  DETECTED Final   Enterobacter cloacae complex NOT DETECTED NOT DETECTED Final   Escherichia coli NOT DETECTED NOT DETECTED Final   Klebsiella oxytoca NOT DETECTED NOT DETECTED Final   Klebsiella pneumoniae NOT DETECTED NOT DETECTED Final   Proteus species NOT DETECTED NOT DETECTED Final   Serratia marcescens NOT DETECTED NOT DETECTED Final   Haemophilus influenzae NOT DETECTED NOT DETECTED Final   Neisseria meningitidis NOT DETECTED NOT DETECTED Final   Pseudomonas aeruginosa NOT DETECTED NOT DETECTED Final   Candida albicans NOT DETECTED NOT DETECTED Final   Candida glabrata NOT DETECTED NOT DETECTED Final   Candida krusei NOT DETECTED NOT DETECTED Final   Candida parapsilosis NOT DETECTED NOT DETECTED Final   Candida tropicalis NOT DETECTED NOT DETECTED Final    Comment: Performed at [facility], [place] [address] [place], [place] [address]  MRSA PCR Screening     Status: None   Collection Time: 10/03/18  6:46 PM  Result Value Ref Range Status   MRSA by PCR NEGATIVE NEGATIVE Final    Comment:        The GeneXpert MRSA Assay (FDA approved for NASAL specimens only), is one component of a comprehensive MRSA colonization surveillance program. It is not intended to diagnose MRSA infection nor to guide or monitor treatment for MRSA infections. Performed at [facility], [place] [address], [place], [place]  [address]   Culture, blood (Routine X 2) w Reflex to ID Panel     Status: None   Collection Time: 10/04/18  1:18 PM  Result Value Ref Range Status   Specimen Description BLOOD BLOOD LEFT HAND  Final   Special Requests AEROBIC BOTTLE ONLY Blood Culture adequate volume  Final   Culture   Final    NO GROWTH 5 DAYS Performed at [facility], [place] [address] [place], [place] [address]    Report Status 10/09/2018 FINAL  Final  Culture, blood (routine x 2)     Status: None   Collection Time: 10/06/18 11:13 AM  Result Value Ref Range Status   Specimen Description BLOOD  RIGHT FOREARM  Final   Special Requests   Final    BOTTLES DRAWN AEROBIC AND ANAEROBIC Blood Culture adequate volume   Culture   Final    NO GROWTH 5 DAYS Performed at [facility], [place] [address], [place], [place] [address]    Report Status 10/11/2018 FINAL  Final  Culture, blood (routine x 2)     Status: None   Collection Time: 10/06/18 11:18 AM  Result Value Ref Range Status   Specimen Description BLOOD RIGHT HAND  Final   Special Requests   Final    BOTTLES DRAWN AEROBIC AND ANAEROBIC Blood Culture adequate volume   Culture   Final    NO GROWTH 5 DAYS Performed at [facility], [place] [address], [place], [place] [address]    Report Status 10/11/2018 FINAL  Final    RADIOLOGY STUDIES/RESULTS: Ct Head Wo Contrast  Result Date: 10/04/2018 CLINICAL DATA:  Head trauma, ataxia EXAM: CT HEAD WITHOUT CONTRAST CT CERVICAL SPINE WITHOUT CONTRAST TECHNIQUE: Multidetector CT imaging of the head and cervical spine was performed following the standard protocol without intravenous contrast. Multiplanar CT image reconstructions of the cervical spine were also generated. COMPARISON:  Head CT yesterday at 0455 hour, brain MRI 09/16/2018. Cervical spine CT 07/26/2018 FINDINGS: CT HEAD FINDINGS Brain: No intracranial hemorrhage, mass effect, or midline shift. No hydrocephalus. The basilar cisterns are patent. No evidence of territorial infarct or acute ischemia. No extra-axial or intracranial fluid collection. Stable degree of atrophy and chronic small vessel ischemia. Remote left cerebellar infarct. Vascular: Atherosclerosis of skullbase vasculature without hyperdense vessel or abnormal calcification. Skull: Innumerable skull lesions consistent with patient's history of myeloma, unchanged. No skull fracture. Sinuses/Orbits: No acute finding. Other: None. CT CERVICAL SPINE FINDINGS Alignment: Trace anterolisthesis of C2 on C3, unchanged. No traumatic subluxation. Skull base and vertebrae: No acute  fracture. Multiple small lucencies consistent with multiple myeloma, unchanged from prior exam. No evidence of pathologic fracture. Skull base is intact. Soft tissues and spinal canal: No prevertebral fluid or swelling. No visible canal hematoma. Disc levels: Multilevel degenerative disc disease with disc space narrowing and endplate spurring, most prominent at C3-C4 and C6-C7. Multilevel facet arthropathy. Upper chest: Right internal jugular dialysis catheter. No acute findings. Other: Myelomatous lesions noted in the right clavicle, bilateral scapula,, sternum and multiple ribs. IMPRESSION: 1. No acute intracranial abnormality. No skull fracture. Stable atrophy and chronic small vessel ischemia. 2. Multilevel degenerative change in the cervical spine without acute fracture or subluxation. 3. Multiple osseous lesions in the calvarium and cervical spine consistent with known multiple myeloma. Electronically Signed   By: Keith Rake M.D.   On: 10/04/2018 02:32   Ct Head Wo Contrast  Result Date: 10/03/2018 CLINICAL DATA:  Acute onset of altered mental status.  EXAM: CT HEAD WITHOUT CONTRAST TECHNIQUE: Contiguous axial images were obtained from the base of the skull through the vertex without intravenous contrast. COMPARISON:  CT of the head and MRI of the brain performed 09/16/2018 FINDINGS: Brain: No evidence of acute infarction, hemorrhage, hydrocephalus, extra-axial collection or mass lesion / mass effect. Prominence of the ventricles and sulci reflects mild cortical volume loss. Mild cerebellar atrophy is noted. Scattered periventricular and subcortical white matter change likely reflects small vessel ischemic microangiopathy. Chronic ischemic change is noted at the basal ganglia bilaterally. A small chronic lacunar infarct is noted at the left cerebellar hemisphere. The brainstem and fourth ventricle are within normal limits. The cerebral hemispheres demonstrate grossly normal gray-white differentiation.  No mass effect or midline shift is seen. Vascular: No hyperdense vessel or unexpected calcification. Skull: There is no evidence of fracture. Numerous large lytic lesions are noted throughout the visualized osseous structures, measuring up to 2.0 cm in size. These likely reflect the patient's known multiple myeloma. Sinuses/Orbits: The orbits are within normal limits. The paranasal sinuses and mastoid air cells are well-aerated. Other: No significant soft tissue abnormalities are seen. IMPRESSION: 1. No acute intracranial pathology seen on CT. 2. Mild cortical volume loss and scattered small vessel ischemic microangiopathy. 3. Chronic ischemic change at the basal ganglia bilaterally. Small chronic lacunar infarct at the left cerebellar hemisphere. 4. Numerous large lytic lesions throughout the visualized osseous structures, measuring up to 2.0 cm in size. These likely reflect the patient's known multiple myeloma. Electronically Signed   By: Garald Balding M.D.   On: 10/03/2018 05:11   Ct Cervical Spine Wo Contrast  Result Date: 10/04/2018 CLINICAL DATA:  Head trauma, ataxia EXAM: CT HEAD WITHOUT CONTRAST CT CERVICAL SPINE WITHOUT CONTRAST TECHNIQUE: Multidetector CT imaging of the head and cervical spine was performed following the standard protocol without intravenous contrast. Multiplanar CT image reconstructions of the cervical spine were also generated. COMPARISON:  Head CT yesterday at 0455 hour, brain MRI 09/16/2018. Cervical spine CT 07/26/2018 FINDINGS: CT HEAD FINDINGS Brain: No intracranial hemorrhage, mass effect, or midline shift. No hydrocephalus. The basilar cisterns are patent. No evidence of territorial infarct or acute ischemia. No extra-axial or intracranial fluid collection. Stable degree of atrophy and chronic small vessel ischemia. Remote left cerebellar infarct. Vascular: Atherosclerosis of skullbase vasculature without hyperdense vessel or abnormal calcification. Skull: Innumerable skull  lesions consistent with patient's history of myeloma, unchanged. No skull fracture. Sinuses/Orbits: No acute finding. Other: None. CT CERVICAL SPINE FINDINGS Alignment: Trace anterolisthesis of C2 on C3, unchanged. No traumatic subluxation. Skull base and vertebrae: No acute fracture. Multiple small lucencies consistent with multiple myeloma, unchanged from prior exam. No evidence of pathologic fracture. Skull base is intact. Soft tissues and spinal canal: No prevertebral fluid or swelling. No visible canal hematoma. Disc levels: Multilevel degenerative disc disease with disc space narrowing and endplate spurring, most prominent at C3-C4 and C6-C7. Multilevel facet arthropathy. Upper chest: Right internal jugular dialysis catheter. No acute findings. Other: Myelomatous lesions noted in the right clavicle, bilateral scapula,, sternum and multiple ribs. IMPRESSION: 1. No acute intracranial abnormality. No skull fracture. Stable atrophy and chronic small vessel ischemia. 2. Multilevel degenerative change in the cervical spine without acute fracture or subluxation. 3. Multiple osseous lesions in the calvarium and cervical spine consistent with known multiple myeloma. Electronically Signed   By: Keith Rake M.D.   On: 10/04/2018 02:32   Mr Brain Wo Contrast  Result Date: 09/16/2018 CLINICAL DATA:  Altered mental status and weakness.  History of end-stage renal disease on dialysis, multiple myeloma, hypertension. EXAM: MRI HEAD WITHOUT CONTRAST TECHNIQUE: Multiplanar, multiecho pulse sequences of the brain and surrounding structures were obtained without intravenous contrast. COMPARISON:  CT HEAD September 16, 2018 and MRI head April 16, 2018 FINDINGS: INTRACRANIAL CONTENTS: No reduced diffusion to suggest acute ischemia. Numerous infratentorial to lesser extent supratentorial chronic microhemorrhages. Old small LEFT cerebellar infarct. Hazy T2 hyperintense signal bilateral basal ganglia a increased from prior MRI.  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 Patchy supratentorial white matter FLAIR T2 hyperintensities. The ventricles and sulci are normal for patient's age. No suspicious parenchymal signal, masses, mass effect. No abnormal extra-axial fluid collections. No extra-axial masses. VASCULAR: Normal major intracranial vascular flow voids present at skull base. SKULL AND UPPER CERVICAL SPINE: No abnormal sellar expansion. Mildly reduced diffusion with low ADC values of numerous calvarial metastasis corresponding to known multiple myeloma. Heterogeneous calvarial and cervical spine bone marrow signal consistent with myeloproliferative disease. Craniocervical junction maintained. SINUSES/ORBITS: The mastoid air-cells and included paranasal sinuses are well-aerated.The included ocular globes and orbital contents are non-suspicious. OTHER: Patient is edentulous. IMPRESSION: 1. No acute infarct. 2. Increased basal ganglia and thalami edema seen with hypertensive encephalopathy, less likely rapidly progressing small vessel ischemic changes. 3. Moderate chronic small vessel ischemic changes. 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 4. Multiple osseous metastasis/multiple myeloma. Electronically Signed   By: Elon Alas M.D.   On: 09/16/2018 05:24   Dg Chest Port 1 View  Result Date: 09/16/2018 CLINICAL DATA:  Altered mental status. EXAM: PORTABLE CHEST 1 VIEW COMPARISON:  Radiographs 08/25/2018 FINDINGS: Right internal jugular dialysis catheter tip in the distal SVC. Unchanged cardiomegaly. Unchanged aortic tortuosity. Minimal vascular congestion without pulmonary edema. No focal airspace disease, large pleural effusion or pneumothorax. No acute osseous abnormalities are seen. IMPRESSION: Stable cardiomegaly. Minimal vascular congestion. Electronically Signed   By: Keith Rake M.D.   On: 09/16/2018 01:57   Ct Head Code Stroke Wo Contrast  Result Date: 09/16/2018 CLINICAL DATA:   Code stroke. RIGHT facial droop. Aphasic. History of multiple myeloma, hypertension. EXAM: CT HEAD WITHOUT CONTRAST TECHNIQUE: Contiguous axial images were obtained from the base of the skull through the vertex without intravenous contrast. COMPARISON:  CT HEAD August 25, 2018 and MRI head April 16, 2016 FINDINGS: Mild motion degraded examination. BRAIN: No intraparenchymal hemorrhage, mass effect nor midline shift. The ventricles and sulci are normal for age. 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 Old small LEFT cerebellar infarct. Patchy supratentorial white matter hypodensities. No acute large vascular territory infarcts. No abnormal extra-axial fluid collections. Basal cisterns are patent. VASCULAR: Mild calcific atherosclerosis of the carotid siphons. SKULL: No skull fracture. Multiple soft tissue masses within the calvarium including LEFT sphenoid eroding the inner and outer tables. No significant scalp soft tissue swelling. SINUSES/ORBITS: Mild paranasal sinus mucosal thickening. Mastoid air cells are well aerated. Soft tissue within the external auditory canals most compatible with cerumen.The included ocular globes and orbital contents are non-suspicious. OTHER: None. ASPECTS [facility] Stroke Program Early CT Score) - Ganglionic level infarction (caudate, lentiform nuclei, internal capsule, insula, M1-M3 cortex): 7 - Supraganglionic infarction (M4-M6 cortex): 3 Total score (0-10 with 10 being normal): 10 IMPRESSION: 1. Mild motion degraded examination.  No acute intracranial process. 2. ASPECTS is 10. 3. Mild chronic small vessel ischemic changes. 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 4. Multiple osseous lesions consistent with known multiple myeloma. 5. Critical Value/emergent results text paged to [place]  via AMION secure system on 09/16/2018 at 12:18 am, including interpreting physician's phone number. Electronically Signed   By: Elon Alas M.D.    On: 09/16/2018 00:18     LOS: 9 days   Oren Binet, MD  [facility]  If 7PM-7AM, please contact night-coverage  Please page via [email or web address] on MD name and type text message  10/12/2018, 11:39 AM

## 2018-10-12 NOTE — Progress Notes (Addendum)
  [facility] Progress Note   Subjective:   Pt seen in the room.   Objective Vitals:   10/11/18 1543 10/11/18 2218 10/12/18 0252 10/12/18 0509  BP: 119/77 127/81  116/83  Pulse: (!) 45 (!) 43  (!) 43  Resp: '16 17  16  '$ Temp: 98.1 F (36.7 C) 99.8 F (37.7 C)  98.9 F (37.2 C)  TempSrc: Oral Oral  Oral  SpO2: 100% 100%  100%  Weight:   53 kg   Height:       Physical Exam General:NAD, chronically ill appearing, elderly, frail female Heart:RRR, no mrg Lungs:CTAB, nml WOB Abdomen:soft, NTND Extremities:no LE edema Dialysis Access: R [facility]   Filed Weights   10/10/18 1340 10/10/18 1715 10/12/18 0252  Weight: 54 kg 51 kg 53 kg   No intake or output data in the 24 hours ending 10/12/18 1224  Additional Objective Labs: Basic Metabolic Panel: Recent Labs  Lab 10/10/18 1441 10/11/18 0336 10/12/18 0455  NA 131* 128* 128*  K 4.6 3.5 4.2  CL 96* 95* 95*  CO2 '27 27 27  '$ GLUCOSE 76 87 101*  BUN 55* 21 39*  CREATININE 7.34* 3.79* 5.51*  CALCIUM 9.0 9.1 9.2  PHOS 3.3 2.5 4.2   Liver Function Tests: Recent Labs  Lab 10/10/18 1441 10/11/18 0336 10/12/18 0455  ALBUMIN 1.8* 1.9* 1.8*   CBC: Recent Labs  Lab 10/07/18 0540 10/08/18 0423 10/09/18 0401 10/10/18 1441 10/10/18 1841 10/11/18 0336 10/12/18 0455  WBC 5.3 3.6* 3.3* 3.4* 3.5* 4.1 3.5*  NEUTROABS 4.0 2.6 2.5  --   --   --   --   HGB 8.3* 7.0* 5.9* 5.9* 8.7* 7.8* 7.4*  HCT 27.6* 23.2* 20.4* 20.6* 28.2* 25.6* 24.4*  MCV 105.7* 104.0* 106.3* 107.3* 102.5* 103.2* 105.2*  PLT 232 189 183 195 215 200 177   Medications: .  ceFAZolin (ANCEF) IV Stopped (10/10/18 1635)  . cefTAZidime (FORTAZ)  IV     . sodium chloride   Intravenous Once  . acetaminophen  650 mg Oral Once  . amLODipine  10 mg Oral QPM  . Chlorhexidine Gluconate Cloth  6 each Topical Q0600  . Chlorhexidine Gluconate Cloth  6 each Topical Q0600  . darbepoetin (ARANESP) injection - DIALYSIS  200 mcg Intravenous Q Sat-HD  .  famotidine  20 mg Oral Daily  . isosorbide mononitrate  60 mg Oral Daily  . lamoTRIgine  50 mg Oral Daily  . latanoprost  1 drop Both Eyes QHS  . LORazepam  0.5 mg Oral QHS  . losartan  50 mg Oral QPM    Dialysis Orders: AF TTS 3.5h 425/1.5x EDW 54kg 2K/2.25Ca  TDC No heparin bolus Hectorol 2 mcg IV TIW Retacrit 20000 U IV TIW     Assessment: 1. MSSA/ GNR bacteremia 2. ESRD on HD 3. FTT/ dementia w/ intermittent psychosis 4. 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 HTN 7. Multiple myeloma  P: 1. FTT w/ progressive dementia on HD.  Daughter and uncle are supportive of our recommendation of HD withdrawal for FTT and dementia.  Have told patient we have done all we can and will be planning for transition to hospice, probably [facility].  No further dialysis. Will consult SW. Questions answered. She requested removal of her HD cath, will consult IR for this.  Will follow from a distance from here.    Kelly Splinter MD [facility] pager [phone]   10/12/2018, 12:24 PM

## 2018-10-13 ENCOUNTER — Encounter (HOSPITAL_COMMUNITY): Payer: Self-pay | Admitting: Interventional Radiology

## 2018-10-13 ENCOUNTER — Inpatient Hospital Stay (HOSPITAL_COMMUNITY): Payer: Medicare Other

## 2018-10-13 HISTORY — PX: IR REMOVAL TUN CV CATH W/O FL: IMG2289

## 2018-10-13 MED ORDER — ISOSORBIDE MONONITRATE ER 60 MG PO TB24: 60.0000 mg | ORAL_TABLET | Freq: Every day | ORAL | Status: AC

## 2018-10-13 MED ORDER — AMLODIPINE BESYLATE 10 MG PO TABS: 10.0000 mg | ORAL_TABLET | Freq: Every evening | ORAL | Status: AC

## 2018-10-13 MED ORDER — HYDROMORPHONE HCL 2 MG PO TABS: 1.0000 mg | ORAL_TABLET | Freq: Two times a day (BID) | ORAL | 0 refills | Status: AC | PRN

## 2018-10-13 MED ORDER — LIDOCAINE HCL 1 % IJ SOLN
INTRAMUSCULAR | Status: DC | PRN
  Administered 2018-10-13: 10 mL

## 2018-10-13 MED ORDER — LORAZEPAM 1 MG PO TABS: 1.0000 mg | ORAL_TABLET | ORAL | 0 refills | Status: AC | PRN

## 2018-10-13 MED ORDER — LIDOCAINE HCL 1 % IJ SOLN
INTRAMUSCULAR | Status: AC
  Filled 2018-10-13: qty 20

## 2018-10-13 MED ORDER — CHLORHEXIDINE GLUCONATE 4 % EX LIQD
CUTANEOUS | Status: AC
  Filled 2018-10-13: qty 15

## 2018-10-13 NOTE — Discharge Summary (Signed)
PATIENT DETAILS Name: [name] Age: 78 y.o. Sex: female Date of Birth: Dec 19, 1940 MRN: [id number]. Admitting Physician: Kayleen Memos, DO DXA:JOINOM, Darrick Penna, MD  Admit Date: 10/03/2018 Discharge date: 10/13/2018  Recommendations for Outpatient Follow-up:  1. Being transferred to residential hospice-optimize comfort medications.  No further hemodialysis per nephrology.  Admitted From:  SNF  Disposition: [name]: No  Equipment/Devices: None  Discharge Condition: Stable  CODE STATUS:  DNR  Diet recommendation:  Regular   Brief Summary: See H&P, Labs, Consult and Test reports for all details in brief,Patient is a 78 y.o. female history of ESRD on HD TTS, multiple myeloma, dementia, chronic diastolic heart failure, chronic hyponatremia-ambulatory dysfunction-bed to wheelchair bound-presented with worsening confusion-thought to be secondary to hypertensive encephalopathy and recurrent bacteremia.  Hospital course complicated by waxing and waning confusion-after multiple discussions with family-by the palliative care team, nephrology and by this MD-transition to full comfort measures-nephrology has stopped further HD care.  Plans are to discharge to be can place if bed available later today.    Brief Hospital Course: Acute metabolic encephalopathy: Somewhat better today compared to the past few days-but per nursing staff confusion continues to wax and wane, per daughter-she appears more confused after HD.   Encephalopathy thought to be secondary from bacteremia and hypertensive encephalopathy.  CT head on 1/4 was without any acute abnormalities.   MSSA/Achromobacter bacteremia:  This is been a recurrent issue for this patient over the past few admissions-thought to be secondary to HD catheter.  But given overall frailty and numerous medical comorbidities-nephrology did not feel like this patient needed a catheter exchange.  Evaluated by ID-was on  cefazolin and ceftazidime-with recommendations to continue to 11/03/2018.  Since no longer on HD-and plan to transition to residential hospice on discharge-all antimicrobial therapy was discontinued on 1/12.    Hypertensive emergency: BP controlled-we have minimized her antihypertensive medications as much as possible-blood pressure continues to fluctuate at times-continue with amlodipine and Imdur-if she.  Further in the next few days-these can be discontinued as well.  Anemia: Secondary to ESRD/multiple myeloma-perhaps worsened by acute illness.  1 unit of PRBC transfused on 1/10.    Hemoglobin continues to be in the low range-7.4 on 1/12.  Since being discharged residential hospice-do not think further monitoring is necessary.  ESRD on HD TTS:  Multiple discussions with daughter throughout this hospital course-by this MD, palliative care and by nephrology.  On 1/9-daughter agreed to start HD and transition to comfort care/hospice-however on 1/10 she decided to resume HD.  Discussions were ongoing-on 1/12-the daughter reached out to this MD-and subsequently to nephrology-she is now more accepting of the fact of stopping HD.  Nephrology has discontinued further HD as well.   Chronic diastolic heart failure: Volume management was with HD  Chronic hyponatremia: Appears mild-was managed with HD  Dysphagia: Seen by speech therapy-continue dysphagia 1 diet with full aspiration precautions.  Moderate pulmonary hypertension: Supportive care  Failure to thrive syndrome/debility/deconditioning/palliative care: DNR in place-poor overall prognosis-continues to have recurrent bacteremia and failure to thrive syndrome.  Is mostly bed to wheelchair bound over the past few months.  Resident of a local SNF. Bacteremia is felt to be probably related to HD catheter-nephrology recommending not to exchange the catheter given severe comorbidities. On 1/9-after patient's daughter agreed-patient was transitioned to  comfort measures-however on 1/10-family changed her mind. Long discussion with the patient's daughter on 1/12 by this MD and nephrology-no further HD-see discussion above.  Awaiting residential hospice bed.  Procedures/Studies: None  Discharge Diagnoses:  Principal Problem:   Bacteremia Active Problems:   Hypertension associatd with end stage renal disease on dialysis   Discharge Instructions:  Activity:  As tolerated with Full fall precautions use walker/cane & assistance as needed   Discharge Instructions    Diet general   Complete by:  As directed    Dysphagia 1 diet with thin liquids   Increase activity slowly   Complete by:  As directed      Allergies as of 10/13/2018      Reactions   Phenergan [promethazine Hcl] Other (See Comments)   Acute encephalopathy in the context of refusal to go to hemodialysis and administration of Phenergan for nausea and vomiting      Medication List    STOP taking these medications   acetaminophen 325 MG tablet Commonly known as:  TYLENOL   bisacodyl 10 MG suppository Commonly known as:  DULCOLAX   calcium acetate 667 MG capsule Commonly known as:  PHOSLO   cloNIDine 0.1 MG tablet Commonly known as:  CATAPRES   cloNIDine 0.2 mg/24hr patch Commonly known as:  CATAPRES - Dosed in mg/24 hr   guaiFENesin-dextromethorphan 100-10 MG/5ML syrup Commonly known as:  ROBITUSSIN DM   hydrocortisone cream 1 %   lactulose 10 GM/15ML solution Commonly known as:  CHRONULAC   lamoTRIgine 25 MG tablet Commonly known as:  LAMICTAL   latanoprost 0.005 % ophthalmic solution Commonly known as:  XALATAN   loperamide 2 MG tablet Commonly known as:  IMODIUM A-D   losartan 50 MG tablet Commonly known as:  COZAAR   multivitamin Tabs tablet   polyethylene glycol packet Commonly known as:  MIRALAX / GLYCOLAX   ranitidine 300 MG tablet Commonly known as:  ZANTAC   senna-docusate 8.6-50 MG tablet Commonly known as:  Senokot-S     sevelamer carbonate 800 MG tablet Commonly known as:  RENVELA   simethicone 125 MG chewable tablet Commonly known as:  MYLICON   valACYclovir 500 MG tablet Commonly known as:  VALTREX   ZOSYN 2.25 (2-0.25) g injection Generic drug:  piperacillin-tazobactam     TAKE these medications   amLODipine 10 MG tablet Commonly known as:  NORVASC Take 1 tablet (10 mg total) by mouth every evening. Hold for SBP <100MM/HG   HYDROmorphone 2 MG tablet Commonly known as:  DILAUDID Take 0.5 tablets (1 mg total) by mouth every 12 (twelve) hours as needed for severe pain (COMFORT).   isosorbide mononitrate 60 MG 24 hr tablet Commonly known as:  IMDUR Take 1 tablet (60 mg total) by mouth daily.   LORazepam 1 MG tablet Commonly known as:  ATIVAN Take 1 tablet (1 mg total) by mouth every 4 (four) hours as needed for anxiety or sedation. What changed:    medication strength  how much to take  when to take this  reasons to take this      Follow-up Information    Hendricks Limes, MD Follow up.   Specialty:  Internal Medicine Why:  AS NEEDED Contact information: [place] [address] [phone]          Allergies  Allergen Reactions  . Phenergan [Promethazine Hcl] Other (See Comments)    Acute encephalopathy in the context of refusal to go to hemodialysis and administration of Phenergan for nausea and vomiting    Consultations:   nephrology and Palliative   Other Procedures/Studies: Ct Head Wo Contrast  Result Date:  10/04/2018 CLINICAL DATA:  Head trauma, ataxia EXAM: CT HEAD WITHOUT CONTRAST CT CERVICAL SPINE WITHOUT CONTRAST TECHNIQUE: Multidetector CT imaging of the head and cervical spine was performed following the standard protocol without intravenous contrast. Multiplanar CT image reconstructions of the cervical spine were also generated. COMPARISON:  Head CT yesterday at 0455 hour, brain MRI 09/16/2018. Cervical spine CT 07/26/2018 FINDINGS: CT HEAD  FINDINGS Brain: No intracranial hemorrhage, mass effect, or midline shift. No hydrocephalus. The basilar cisterns are patent. No evidence of territorial infarct or acute ischemia. No extra-axial or intracranial fluid collection. Stable degree of atrophy and chronic small vessel ischemia. Remote left cerebellar infarct. Vascular: Atherosclerosis of skullbase vasculature without hyperdense vessel or abnormal calcification. Skull: Innumerable skull lesions consistent with patient's history of myeloma, unchanged. No skull fracture. Sinuses/Orbits: No acute finding. Other: None. CT CERVICAL SPINE FINDINGS Alignment: Trace anterolisthesis of C2 on C3, unchanged. No traumatic subluxation. Skull base and vertebrae: No acute fracture. Multiple small lucencies consistent with multiple myeloma, unchanged from prior exam. No evidence of pathologic fracture. Skull base is intact. Soft tissues and spinal canal: No prevertebral fluid or swelling. No visible canal hematoma. Disc levels: Multilevel degenerative disc disease with disc space narrowing and endplate spurring, most prominent at C3-C4 and C6-C7. Multilevel facet arthropathy. Upper chest: Right internal jugular dialysis catheter. No acute findings. Other: Myelomatous lesions noted in the right clavicle, bilateral scapula,, sternum and multiple ribs. IMPRESSION: 1. No acute intracranial abnormality. No skull fracture. Stable atrophy and chronic small vessel ischemia. 2. Multilevel degenerative change in the cervical spine without acute fracture or subluxation. 3. Multiple osseous lesions in the calvarium and cervical spine consistent with known multiple myeloma. Electronically Signed   By: Keith Rake M.D.   On: 10/04/2018 02:32   Ct Head Wo Contrast  Result Date: 10/03/2018 CLINICAL DATA:  Acute onset of altered mental status. EXAM: CT HEAD WITHOUT CONTRAST TECHNIQUE: Contiguous axial images were obtained from the base of the skull through the vertex without  intravenous contrast. COMPARISON:  CT of the head and MRI of the brain performed 09/16/2018 FINDINGS: Brain: No evidence of acute infarction, hemorrhage, hydrocephalus, extra-axial collection or mass lesion / mass effect. Prominence of the ventricles and sulci reflects mild cortical volume loss. Mild cerebellar atrophy is noted. Scattered periventricular and subcortical white matter change likely reflects small vessel ischemic microangiopathy. Chronic ischemic change is noted at the basal ganglia bilaterally. A small chronic lacunar infarct is noted at the left cerebellar hemisphere. The brainstem and fourth ventricle are within normal limits. The cerebral hemispheres demonstrate grossly normal gray-white differentiation. No mass effect or midline shift is seen. Vascular: No hyperdense vessel or unexpected calcification. Skull: There is no evidence of fracture. Numerous large lytic lesions are noted throughout the visualized osseous structures, measuring up to 2.0 cm in size. These likely reflect the patient's known multiple myeloma. Sinuses/Orbits: The orbits are within normal limits. The paranasal sinuses and mastoid air cells are well-aerated. Other: No significant soft tissue abnormalities are seen. IMPRESSION: 1. No acute intracranial pathology seen on CT. 2. Mild cortical volume loss and scattered small vessel ischemic microangiopathy. 3. Chronic ischemic change at the basal ganglia bilaterally. Small chronic lacunar infarct at the left cerebellar hemisphere. 4. Numerous large lytic lesions throughout the visualized osseous structures, measuring up to 2.0 cm in size. These likely reflect the patient's known multiple myeloma. Electronically Signed   By: Garald Balding M.D.   On: 10/03/2018 05:11   Ct Cervical Spine Wo Contrast  Result  Date: 10/04/2018 CLINICAL DATA:  Head trauma, ataxia EXAM: CT HEAD WITHOUT CONTRAST CT CERVICAL SPINE WITHOUT CONTRAST TECHNIQUE: Multidetector CT imaging of the head and  cervical spine was performed following the standard protocol without intravenous contrast. Multiplanar CT image reconstructions of the cervical spine were also generated. COMPARISON:  Head CT yesterday at 0455 hour, brain MRI 09/16/2018. Cervical spine CT 07/26/2018 FINDINGS: CT HEAD FINDINGS Brain: No intracranial hemorrhage, mass effect, or midline shift. No hydrocephalus. The basilar cisterns are patent. No evidence of territorial infarct or acute ischemia. No extra-axial or intracranial fluid collection. Stable degree of atrophy and chronic small vessel ischemia. Remote left cerebellar infarct. Vascular: Atherosclerosis of skullbase vasculature without hyperdense vessel or abnormal calcification. Skull: Innumerable skull lesions consistent with patient's history of myeloma, unchanged. No skull fracture. Sinuses/Orbits: No acute finding. Other: None. CT CERVICAL SPINE FINDINGS Alignment: Trace anterolisthesis of C2 on C3, unchanged. No traumatic subluxation. Skull base and vertebrae: No acute fracture. Multiple small lucencies consistent with multiple myeloma, unchanged from prior exam. No evidence of pathologic fracture. Skull base is intact. Soft tissues and spinal canal: No prevertebral fluid or swelling. No visible canal hematoma. Disc levels: Multilevel degenerative disc disease with disc space narrowing and endplate spurring, most prominent at C3-C4 and C6-C7. Multilevel facet arthropathy. Upper chest: Right internal jugular dialysis catheter. No acute findings. Other: Myelomatous lesions noted in the right clavicle, bilateral scapula,, sternum and multiple ribs. IMPRESSION: 1. No acute intracranial abnormality. No skull fracture. Stable atrophy and chronic small vessel ischemia. 2. Multilevel degenerative change in the cervical spine without acute fracture or subluxation. 3. Multiple osseous lesions in the calvarium and cervical spine consistent with known multiple myeloma. Electronically Signed   By:  Keith Rake M.D.   On: 10/04/2018 02:32   Mr Brain Wo Contrast  Result Date: 09/16/2018 CLINICAL DATA:  Altered mental status and weakness. History of end-stage renal disease on dialysis, multiple myeloma, hypertension. EXAM: MRI HEAD WITHOUT CONTRAST TECHNIQUE: Multiplanar, multiecho pulse sequences of the brain and surrounding structures were obtained without intravenous contrast. COMPARISON:  CT HEAD September 16, 2018 and MRI head April 16, 2018 FINDINGS: INTRACRANIAL CONTENTS: No reduced diffusion to suggest acute ischemia. Numerous infratentorial to lesser extent supratentorial chronic microhemorrhages. Old small LEFT cerebellar infarct. Hazy T2 hyperintense signal bilateral basal ganglia a increased from prior MRI. 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 Patchy supratentorial white matter FLAIR T2 hyperintensities. The ventricles and sulci are normal for patient's age. No suspicious parenchymal signal, masses, mass effect. No abnormal extra-axial fluid collections. No extra-axial masses. VASCULAR: Normal major intracranial vascular flow voids present at skull base. SKULL AND UPPER CERVICAL SPINE: No abnormal sellar expansion. Mildly reduced diffusion with low ADC values of numerous calvarial metastasis corresponding to known multiple myeloma. Heterogeneous calvarial and cervical spine bone marrow signal consistent with myeloproliferative disease. Craniocervical junction maintained. SINUSES/ORBITS: The mastoid air-cells and included paranasal sinuses are well-aerated.The included ocular globes and orbital contents are non-suspicious. OTHER: Patient is edentulous. IMPRESSION: 1. No acute infarct. 2. Increased basal ganglia and thalami edema seen with hypertensive encephalopathy, less likely rapidly progressing small vessel ischemic changes. 3. Moderate chronic small vessel ischemic changes. 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 4. Multiple osseous metastasis/multiple  myeloma. Electronically Signed   By: Elon Alas M.D.   On: 09/16/2018 05:24   Dg Chest Port 1 View  Result Date: 09/16/2018 CLINICAL DATA:  Altered mental status. EXAM: PORTABLE CHEST 1 VIEW COMPARISON:  Radiographs 08/25/2018 FINDINGS: Right internal jugular  dialysis catheter tip in the distal SVC. Unchanged cardiomegaly. Unchanged aortic tortuosity. Minimal vascular congestion without pulmonary edema. No focal airspace disease, large pleural effusion or pneumothorax. No acute osseous abnormalities are seen. IMPRESSION: Stable cardiomegaly. Minimal vascular congestion. Electronically Signed   By: Keith Rake M.D.   On: 09/16/2018 01:57   Ct Head Code Stroke Wo Contrast  Result Date: 09/16/2018 CLINICAL DATA:  Code stroke. RIGHT facial droop. Aphasic. History of multiple myeloma, hypertension. EXAM: CT HEAD WITHOUT CONTRAST TECHNIQUE: Contiguous axial images were obtained from the base of the skull through the vertex without intravenous contrast. COMPARISON:  CT HEAD August 25, 2018 and MRI head April 16, 2016 FINDINGS: Mild motion degraded examination. BRAIN: No intraparenchymal hemorrhage, mass effect nor midline shift. The ventricles and sulci are normal for age. 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 Old small LEFT cerebellar infarct. Patchy supratentorial white matter hypodensities. No acute large vascular territory infarcts. No abnormal extra-axial fluid collections. Basal cisterns are patent. VASCULAR: Mild calcific atherosclerosis of the carotid siphons. SKULL: No skull fracture. Multiple soft tissue masses within the calvarium including LEFT sphenoid eroding the inner and outer tables. No significant scalp soft tissue swelling. SINUSES/ORBITS: Mild paranasal sinus mucosal thickening. Mastoid air cells are well aerated. Soft tissue within the external auditory canals most compatible with cerumen.The included ocular globes and orbital contents are non-suspicious.  OTHER: None. ASPECTS [facility] Stroke Program Early CT Score) - Ganglionic level infarction (caudate, lentiform nuclei, internal capsule, insula, M1-M3 cortex): 7 - Supraganglionic infarction (M4-M6 cortex): 3 Total score (0-10 with 10 being normal): 10 IMPRESSION: 1. Mild motion degraded examination.  No acute intracranial process. 2. ASPECTS is 10. 3. Mild chronic small vessel ischemic changes. 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 4. Multiple osseous lesions consistent with known multiple myeloma. 5. Critical Value/emergent results text paged to [place] via [employer] secure system on 09/16/2018 at 12:18 am, including interpreting physician's phone number. Electronically Signed   By: Elon Alas M.D.   On: 09/16/2018 00:18     TODAY-DAY OF DISCHARGE:  Subjective:   [name] remains confused but knows that she is being discharged to residential hospice  Objective:   Blood pressure 140/77, pulse (!) 45, temperature 98.1 F (36.7 C), temperature source Oral, resp. rate 18, height '5\' 6"'  (1.676 m), weight 53 kg, SpO2 100 %.  Intake/Output Summary (Last 24 hours) at 10/13/2018 1007 Last data filed at 10/13/2018 0535 Gross per 24 hour  Intake 100 ml  Output -  Net 100 ml   Filed Weights   10/10/18 1340 10/10/18 1715 10/12/18 0252  Weight: 54 kg 51 kg 53 kg    Exam: Awake confused [place].AT,PERRAL Supple Neck,No JVD, No cervical lymphadenopathy appriciated.  Symmetrical Chest wall movement, Good air movement bilaterally, CTAB RRR,No Gallops,Rubs or new Murmurs, No Parasternal Heave +ve B.Sounds, Abd Soft, Non tender, No organomegaly appriciated, No rebound -guarding or rigidity. No Cyanosis, Clubbing or edema, No new Rash or bruise   PERTINENT RADIOLOGIC STUDIES: Ct Head Wo Contrast  Result Date: 10/04/2018 CLINICAL DATA:  Head trauma, ataxia EXAM: CT HEAD WITHOUT CONTRAST CT CERVICAL SPINE WITHOUT CONTRAST TECHNIQUE: Multidetector CT imaging of the head  and cervical spine was performed following the standard protocol without intravenous contrast. Multiplanar CT image reconstructions of the cervical spine were also generated. COMPARISON:  Head CT yesterday at 0455 hour, brain MRI 09/16/2018. Cervical spine CT 07/26/2018 FINDINGS: CT HEAD FINDINGS Brain: No intracranial hemorrhage, mass effect, or midline shift.  No hydrocephalus. The basilar cisterns are patent. No evidence of territorial infarct or acute ischemia. No extra-axial or intracranial fluid collection. Stable degree of atrophy and chronic small vessel ischemia. Remote left cerebellar infarct. Vascular: Atherosclerosis of skullbase vasculature without hyperdense vessel or abnormal calcification. Skull: Innumerable skull lesions consistent with patient's history of myeloma, unchanged. No skull fracture. Sinuses/Orbits: No acute finding. Other: None. CT CERVICAL SPINE FINDINGS Alignment: Trace anterolisthesis of C2 on C3, unchanged. No traumatic subluxation. Skull base and vertebrae: No acute fracture. Multiple small lucencies consistent with multiple myeloma, unchanged from prior exam. No evidence of pathologic fracture. Skull base is intact. Soft tissues and spinal canal: No prevertebral fluid or swelling. No visible canal hematoma. Disc levels: Multilevel degenerative disc disease with disc space narrowing and endplate spurring, most prominent at C3-C4 and C6-C7. Multilevel facet arthropathy. Upper chest: Right internal jugular dialysis catheter. No acute findings. Other: Myelomatous lesions noted in the right clavicle, bilateral scapula,, sternum and multiple ribs. IMPRESSION: 1. No acute intracranial abnormality. No skull fracture. Stable atrophy and chronic small vessel ischemia. 2. Multilevel degenerative change in the cervical spine without acute fracture or subluxation. 3. Multiple osseous lesions in the calvarium and cervical spine consistent with known multiple myeloma. Electronically Signed   By:  Keith Rake M.D.   On: 10/04/2018 02:32   Ct Head Wo Contrast  Result Date: 10/03/2018 CLINICAL DATA:  Acute onset of altered mental status. EXAM: CT HEAD WITHOUT CONTRAST TECHNIQUE: Contiguous axial images were obtained from the base of the skull through the vertex without intravenous contrast. COMPARISON:  CT of the head and MRI of the brain performed 09/16/2018 FINDINGS: Brain: No evidence of acute infarction, hemorrhage, hydrocephalus, extra-axial collection or mass lesion / mass effect. Prominence of the ventricles and sulci reflects mild cortical volume loss. Mild cerebellar atrophy is noted. Scattered periventricular and subcortical white matter change likely reflects small vessel ischemic microangiopathy. Chronic ischemic change is noted at the basal ganglia bilaterally. A small chronic lacunar infarct is noted at the left cerebellar hemisphere. The brainstem and fourth ventricle are within normal limits. The cerebral hemispheres demonstrate grossly normal gray-white differentiation. No mass effect or midline shift is seen. Vascular: No hyperdense vessel or unexpected calcification. Skull: There is no evidence of fracture. Numerous large lytic lesions are noted throughout the visualized osseous structures, measuring up to 2.0 cm in size. These likely reflect the patient's known multiple myeloma. Sinuses/Orbits: The orbits are within normal limits. The paranasal sinuses and mastoid air cells are well-aerated. Other: No significant soft tissue abnormalities are seen. IMPRESSION: 1. No acute intracranial pathology seen on CT. 2. Mild cortical volume loss and scattered small vessel ischemic microangiopathy. 3. Chronic ischemic change at the basal ganglia bilaterally. Small chronic lacunar infarct at the left cerebellar hemisphere. 4. Numerous large lytic lesions throughout the visualized osseous structures, measuring up to 2.0 cm in size. These likely reflect the patient's known multiple myeloma.  Electronically Signed   By: Garald Balding M.D.   On: 10/03/2018 05:11   Ct Cervical Spine Wo Contrast  Result Date: 10/04/2018 CLINICAL DATA:  Head trauma, ataxia EXAM: CT HEAD WITHOUT CONTRAST CT CERVICAL SPINE WITHOUT CONTRAST TECHNIQUE: Multidetector CT imaging of the head and cervical spine was performed following the standard protocol without intravenous contrast. Multiplanar CT image reconstructions of the cervical spine were also generated. COMPARISON:  Head CT yesterday at 0455 hour, brain MRI 09/16/2018. Cervical spine CT 07/26/2018 FINDINGS: CT HEAD FINDINGS Brain: No intracranial hemorrhage, mass effect, or midline  shift. No hydrocephalus. The basilar cisterns are patent. No evidence of territorial infarct or acute ischemia. No extra-axial or intracranial fluid collection. Stable degree of atrophy and chronic small vessel ischemia. Remote left cerebellar infarct. Vascular: Atherosclerosis of skullbase vasculature without hyperdense vessel or abnormal calcification. Skull: Innumerable skull lesions consistent with patient's history of myeloma, unchanged. No skull fracture. Sinuses/Orbits: No acute finding. Other: None. CT CERVICAL SPINE FINDINGS Alignment: Trace anterolisthesis of C2 on C3, unchanged. No traumatic subluxation. Skull base and vertebrae: No acute fracture. Multiple small lucencies consistent with multiple myeloma, unchanged from prior exam. No evidence of pathologic fracture. Skull base is intact. Soft tissues and spinal canal: No prevertebral fluid or swelling. No visible canal hematoma. Disc levels: Multilevel degenerative disc disease with disc space narrowing and endplate spurring, most prominent at C3-C4 and C6-C7. Multilevel facet arthropathy. Upper chest: Right internal jugular dialysis catheter. No acute findings. Other: Myelomatous lesions noted in the right clavicle, bilateral scapula,, sternum and multiple ribs. IMPRESSION: 1. No acute intracranial abnormality. No skull  fracture. Stable atrophy and chronic small vessel ischemia. 2. Multilevel degenerative change in the cervical spine without acute fracture or subluxation. 3. Multiple osseous lesions in the calvarium and cervical spine consistent with known multiple myeloma. Electronically Signed   By: Keith Rake M.D.   On: 10/04/2018 02:32   Mr Brain Wo Contrast  Result Date: 09/16/2018 CLINICAL DATA:  Altered mental status and weakness. History of end-stage renal disease on dialysis, multiple myeloma, hypertension. EXAM: MRI HEAD WITHOUT CONTRAST TECHNIQUE: Multiplanar, multiecho pulse sequences of the brain and surrounding structures were obtained without intravenous contrast. COMPARISON:  CT HEAD September 16, 2018 and MRI head April 16, 2018 FINDINGS: INTRACRANIAL CONTENTS: No reduced diffusion to suggest acute ischemia. Numerous infratentorial to lesser extent supratentorial chronic microhemorrhages. Old small LEFT cerebellar infarct. Hazy T2 hyperintense signal bilateral basal ganglia a increased from prior MRI. 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 Patchy supratentorial white matter FLAIR T2 hyperintensities. The ventricles and sulci are normal for patient's age. No suspicious parenchymal signal, masses, mass effect. No abnormal extra-axial fluid collections. No extra-axial masses. VASCULAR: Normal major intracranial vascular flow voids present at skull base. SKULL AND UPPER CERVICAL SPINE: No abnormal sellar expansion. Mildly reduced diffusion with low ADC values of numerous calvarial metastasis corresponding to known multiple myeloma. Heterogeneous calvarial and cervical spine bone marrow signal consistent with myeloproliferative disease. Craniocervical junction maintained. SINUSES/ORBITS: The mastoid air-cells and included paranasal sinuses are well-aerated.The included ocular globes and orbital contents are non-suspicious. OTHER: Patient is edentulous. IMPRESSION: 1. No acute infarct. 2.  Increased basal ganglia and thalami edema seen with hypertensive encephalopathy, less likely rapidly progressing small vessel ischemic changes. 3. Moderate chronic small vessel ischemic changes. 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 4. Multiple osseous metastasis/multiple myeloma. Electronically Signed   By: Elon Alas M.D.   On: 09/16/2018 05:24   Dg Chest Port 1 View  Result Date: 09/16/2018 CLINICAL DATA:  Altered mental status. EXAM: PORTABLE CHEST 1 VIEW COMPARISON:  Radiographs 08/25/2018 FINDINGS: Right internal jugular dialysis catheter tip in the distal SVC. Unchanged cardiomegaly. Unchanged aortic tortuosity. Minimal vascular congestion without pulmonary edema. No focal airspace disease, large pleural effusion or pneumothorax. No acute osseous abnormalities are seen. IMPRESSION: Stable cardiomegaly. Minimal vascular congestion. Electronically Signed   By: Keith Rake M.D.   On: 09/16/2018 01:57   Ct Head Code Stroke Wo Contrast  Result Date: 09/16/2018 CLINICAL DATA:  Code stroke. RIGHT facial droop. Aphasic. History of  multiple myeloma, hypertension. EXAM: CT HEAD WITHOUT CONTRAST TECHNIQUE: Contiguous axial images were obtained from the base of the skull through the vertex without intravenous contrast. COMPARISON:  CT HEAD August 25, 2018 and MRI head April 16, 2016 FINDINGS: Mild motion degraded examination. BRAIN: No intraparenchymal hemorrhage, mass effect nor midline shift. The ventricles and sulci are normal for age. 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 Old small LEFT cerebellar infarct. Patchy supratentorial white matter hypodensities. No acute large vascular territory infarcts. No abnormal extra-axial fluid collections. Basal cisterns are patent. VASCULAR: Mild calcific atherosclerosis of the carotid siphons. SKULL: No skull fracture. Multiple soft tissue masses within the calvarium including LEFT sphenoid eroding the inner and outer  tables. No significant scalp soft tissue swelling. SINUSES/ORBITS: Mild paranasal sinus mucosal thickening. Mastoid air cells are well aerated. Soft tissue within the external auditory canals most compatible with cerumen.The included ocular globes and orbital contents are non-suspicious. OTHER: None. ASPECTS [facility] Stroke Program Early CT Score) - Ganglionic level infarction (caudate, lentiform nuclei, internal capsule, insula, M1-M3 cortex): 7 - Supraganglionic infarction (M4-M6 cortex): 3 Total score (0-10 with 10 being normal): 10 IMPRESSION: 1. Mild motion degraded examination.  No acute intracranial process. 2. ASPECTS is 10. 3. Mild chronic small vessel ischemic changes. 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 4. Multiple osseous lesions consistent with known multiple myeloma. 5. Critical Value/emergent results text paged to [place] via AMION secure system on 09/16/2018 at 12:18 am, including interpreting physician's phone number. Electronically Signed   By: Elon Alas M.D.   On: 09/16/2018 00:18     PERTINENT LAB RESULTS: CBC: Recent Labs    10/11/18 0336 10/12/18 0455  WBC 4.1 3.5*  HGB 7.8* 7.4*  HCT 25.6* 24.4*  PLT 200 177   CMET CMP     Component Value Date/Time   NA 128 (L) 10/12/2018 0455   NA 135 (A) 05/01/2018   NA 135 (L) 02/25/2015 1002   K 4.2 10/12/2018 0455   K 4.2 02/25/2015 1002   CL 95 (L) 10/12/2018 0455   CO2 27 10/12/2018 0455   CO2 29 02/25/2015 1002   GLUCOSE 101 (H) 10/12/2018 0455   GLUCOSE 86 02/25/2015 1002   BUN 39 (H) 10/12/2018 0455   BUN 37 (A) 05/01/2018   BUN 22.3 02/25/2015 1002   CREATININE 5.51 (H) 10/12/2018 0455   CREATININE 5.6 (HH) 02/25/2015 1002   CALCIUM 9.2 10/12/2018 0455   CALCIUM 9.8 02/25/2015 1002   PROT 9.3 (H) 10/03/2018 0325   PROT 8.6 (H) 02/25/2015 1002   ALBUMIN 1.8 (L) 10/12/2018 0455   ALBUMIN 3.1 (L) 02/25/2015 1002   AST 18 10/03/2018 0325   AST 16 02/25/2015 1002   ALT 8  10/03/2018 0325   ALT 10 02/25/2015 1002   ALKPHOS 49 10/03/2018 0325   ALKPHOS 55 02/25/2015 1002   BILITOT 0.2 (L) 10/03/2018 0325   BILITOT 0.52 02/25/2015 1002   GFRNONAA 7 (L) 10/12/2018 0455   GFRAA 8 (L) 10/12/2018 0455    GFR Estimated Creatinine Clearance: 6.9 mL/min (A) (by C-G formula based on SCr of 5.51 mg/dL (H)). No results for input(s): LIPASE, AMYLASE in the last 72 hours. No results for input(s): CKTOTAL, CKMB, CKMBINDEX, TROPONINI in the last 72 hours. Invalid input(s): POCBNP No results for input(s): DDIMER in the last 72 hours. No results for input(s): HGBA1C in the last 72 hours. No results for input(s): CHOL, HDL, LDLCALC, TRIG, CHOLHDL, LDLDIRECT in the last 72 hours.  No results for input(s): TSH, T4TOTAL, T3FREE, THYROIDAB in the last 72 hours.  Invalid input(s): FREET3 No results for input(s): VITAMINB12, FOLATE, FERRITIN, TIBC, IRON, RETICCTPCT in the last 72 hours. Coags: No results for input(s): INR in the last 72 hours.  Invalid input(s): PT Microbiology: Recent Results (from the past 240 hour(s))  MRSA PCR Screening     Status: None   Collection Time: 10/03/18  6:46 PM  Result Value Ref Range Status   MRSA by PCR NEGATIVE NEGATIVE Final    Comment:        The GeneXpert MRSA Assay (FDA approved for NASAL specimens only), is one component of a comprehensive MRSA colonization surveillance program. It is not intended to diagnose MRSA infection nor to guide or monitor treatment for MRSA infections. Performed at [facility], [place] [address], [place], [place] [address]   Culture, blood (Routine X 2) w Reflex to ID Panel     Status: None   Collection Time: 10/04/18  1:18 PM  Result Value Ref Range Status   Specimen Description BLOOD BLOOD LEFT HAND  Final   Special Requests AEROBIC BOTTLE ONLY Blood Culture adequate volume  Final   Culture   Final    NO GROWTH 5 DAYS Performed at [facility], [place] [address] [place], [place]  [address]    Report Status 10/09/2018 FINAL  Final  Culture, blood (routine x 2)     Status: None   Collection Time: 10/06/18 11:13 AM  Result Value Ref Range Status   Specimen Description BLOOD RIGHT FOREARM  Final   Special Requests   Final    BOTTLES DRAWN AEROBIC AND ANAEROBIC Blood Culture adequate volume   Culture   Final    NO GROWTH 5 DAYS Performed at [facility], [place] [address] [place], [place] [address]    Report Status 10/11/2018 FINAL  Final  Culture, blood (routine x 2)     Status: None   Collection Time: 10/06/18 11:18 AM  Result Value Ref Range Status   Specimen Description BLOOD RIGHT HAND  Final   Special Requests   Final    BOTTLES DRAWN AEROBIC AND ANAEROBIC Blood Culture adequate volume   Culture   Final    NO GROWTH 5 DAYS Performed at [facility], [place] [address] [place], [place] [address]    Report Status 10/11/2018 FINAL  Final    FURTHER DISCHARGE INSTRUCTIONS:  Get Medicines reviewed and adjusted: Please take all your medications with you for your next visit with your Primary MD  Laboratory/radiological data: Please request your Primary MD to go over all hospital tests and procedure/radiological results at the follow up, please ask your Primary MD to get all Hospital records sent to his/her office.  In some cases, they will be blood work, cultures and biopsy results pending at the time of your discharge. Please request that your primary care M.D. goes through all the records of your hospital data and follows up on these results.  Also Note the following: If you experience worsening of your admission symptoms, develop shortness of breath, life threatening emergency, suicidal or homicidal thoughts you must seek medical attention immediately by calling 911 or calling your MD immediately  if symptoms less severe.  You must read complete instructions/literature along with all the possible adverse reactions/side effects for all the Medicines you  take and that have been prescribed to you. Take any new Medicines after you have completely understood and  accpet all the possible adverse reactions/side effects.   Do not drive when taking Pain medications or sleeping medications (Benzodaizepines)  Do not take more than prescribed Pain, Sleep and Anxiety Medications. It is not advisable to combine anxiety,sleep and pain medications without talking with your primary care practitioner  Special Instructions: If you have smoked or chewed Tobacco  in the last 2 yrs please stop smoking, stop any regular Alcohol  and or any Recreational drug use.  Wear Seat belts while driving.  Please note: You were cared for by a hospitalist during your hospital stay. Once you are discharged, your primary care physician will handle any further medical issues. Please note that NO REFILLS for any discharge medications will be authorized once you are discharged, as it is imperative that you return to your primary care physician (or establish a relationship with a primary care physician if you do not have one) for your post hospital discharge needs so that they can reassess your need for medications and monitor your lab values.  Total Time spent coordinating discharge including counseling, education and face to face time equals 35 minutes.  SignedOren Binet 10/13/2018 10:07 AM

## 2018-10-13 NOTE — Progress Notes (Addendum)
0700 Bedside shift report, pt sleeping, easy to arouse. Fall precautions in place, [facility].   0800 Pt yelling, RN into room, pt A&Ox4, stated she wanted her tray table pushed back. RN demonstrated with return of how to use the call bell. Pt assessed, see flow sheet. Fall precations in place, [place].  0930 DR at bedside explained to pt that she's going to hospice, pt understands. Pt had BM, WCTM. Fall precautions in place.

## 2018-10-13 NOTE — Progress Notes (Signed)
CSW notified [employer] of daughter's request for transfer.   Percell Locus Brinda Focht LCSW [phone]

## 2018-10-13 NOTE — Progress Notes (Signed)
CSW notified that [facility] paperwork has been completed but they are unable to accept the patient until the morning. CSW will arrange for an early transfer.   [name] Locus Taresa [place] LCSW [phone]

## 2018-10-13 NOTE — Progress Notes (Addendum)
[facility]): RN note  Received request from Cedric Fishman, [place] for family interest in [facility] with request for transfer today. Chart reviewed. Spoke to daughter Hadyn Azer via telephone to confirm interest and explain services. Family can not meet to complete registration paperwork today and is agreeable to transfer tomorrow. Daughter requested a call back 30 minutes after conversation to set up a meeting time for family but unable to reach daughter when called back....unsuccessful in reaching family after multiple attempts. Will continue to reach out and await call back from left messages to set up an appointment in the morning to do paperwork.  CSW aware. [place] will continue to try to reach family or follow up in the morning to set up appointment with daughter so patient may transfer tomorrow. [place] made aware and okay to accept transfer in tomorrow morning.    Dr. Orpah Melter to assume care once transferred to [facility] per family request.   Thank you,  Gar Ponto, [facility] Liaison  [place] are on AMION  Update at 1700 : [place] completed with daughter [name] and niece.  Family agreeable to transfer to [facility] in the morning - [place] aware and will arrange transport for pt to arrive before noon Tuesday if possible.   Discharge summary already faxed to Hospice per CSW.   RN Please call report to [phone].

## 2018-10-13 NOTE — Progress Notes (Signed)
CSW received notification that patient's daughter reported that she is unable to complete paperwork to transfer to [facility] today. Patient not able to transfer without paperwork signed.   Percell Locus Alonna [place] LCSW [phone]

## 2018-10-13 NOTE — Procedures (Addendum)
RIJV HD catheter removal EBL 0 Comp 0

## 2018-10-14 NOTE — Progress Notes (Signed)
Briefly seen and examined-stable to be discharged to residential hospice.  No change in discharge summary

## 2018-10-14 NOTE — Progress Notes (Signed)
Patient will DC to: [facility] Anticipated DC date: 10/14/18 Family notified: Daughter, Surveyor, minerals by: PTAR 9:45am   Per MD patient ready for DC to [facility]. RN, patient, patient's family, and facility notified of DC. Discharge Summary sent to facility. RN to call report prior to discharge [phone]). DC packet on chart. Ambulance transport requested for patient.   CSW will sign off for now as social work intervention is no longer needed. Please consult [place] again if new needs arise.  Cedric Fishman, LCSW Clinical Social Worker [phone]

## 2018-11-01 DEATH — deceased

## 2019-07-24 IMAGING — MR MR HEAD W/O CM
10 of 11 series · 42 of 48 positions shown · non-contrast
Comparison: CT HEAD September 16, 2018 and MRI head April 16, 2018

CLINICAL DATA: Altered mental status and weakness. History of
end-stage renal disease on dialysis, multiple myeloma, hypertension.

EXAM:
MRI HEAD WITHOUT CONTRAST
TECHNIQUE: Multiplanar, multiecho pulse sequences of the brain and surrounding
structures were obtained without intravenous contrast.

[Series 5: DWI · axial · 4.0mm · 0.88mm/px · z∈[-111,+19]mm · 8 of 70 slices shown (1 of 4)]
[im 1/70]
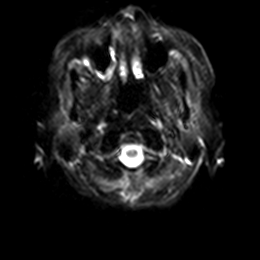
[im 10/70]
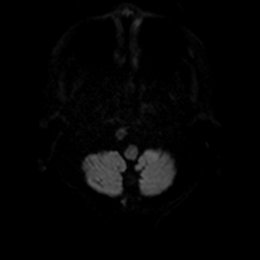
[im 20/70]
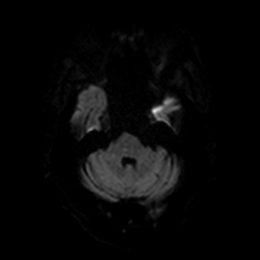
[im 30/70]
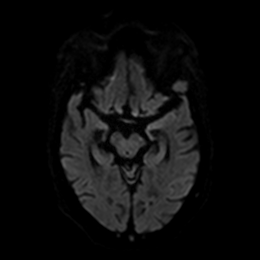
[im 40/70]
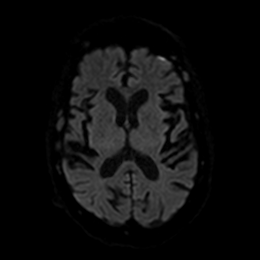
[im 50/70]
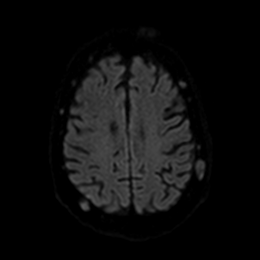
[im 60/70]
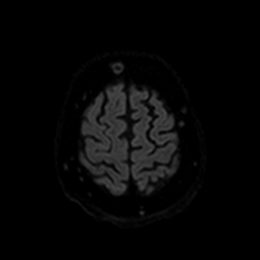
[im 70/70]
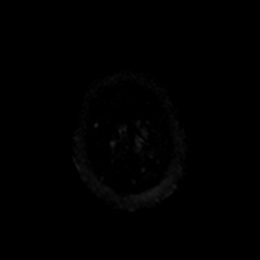

[Series 6: DWI · axial · 4.0mm · 0.88mm/px · z∈[-111,+19]mm · 4 of 35 slices shown (2 of 4)]
[im 1/35]
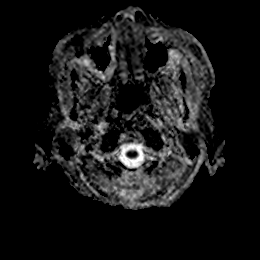
[im 12/35]
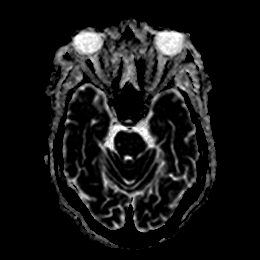
[im 23/35]
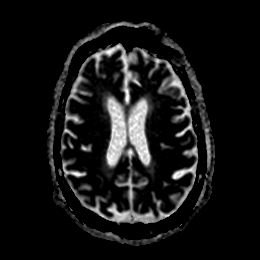
[im 35/35]
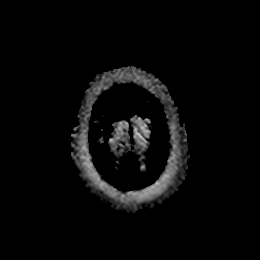

[Series 7: DWI · coronal · 4.0mm · 0.88mm/px · 7 of 70 slices shown (3 of 4)]
[im 1/70]
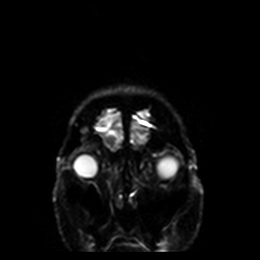
[im 12/70]
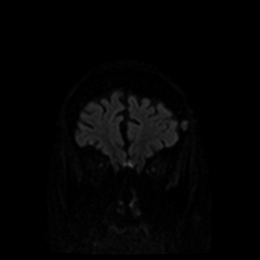
[im 24/70]
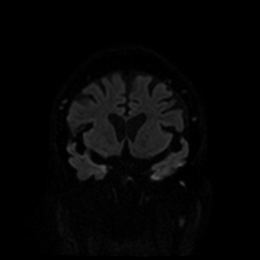
[im 35/70]
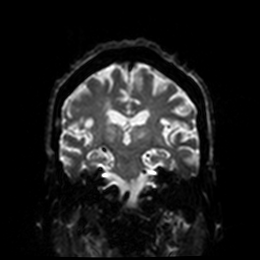
[im 47/70]
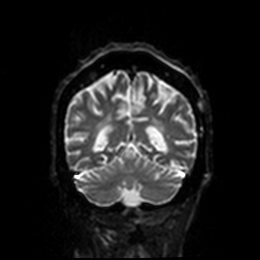
[im 58/70]
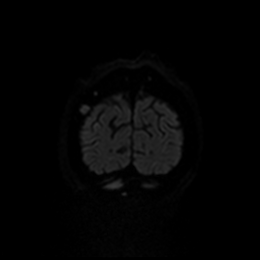
[im 70/70]
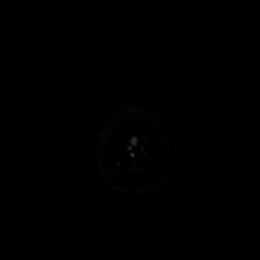

[Series 8: DWI · coronal · 4.0mm · 0.88mm/px · 3 of 35 slices shown (4 of 4)]
[im 1/35]
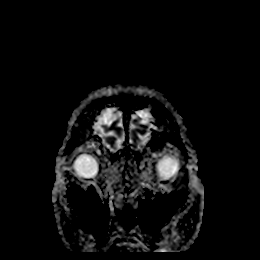
[im 18/35]
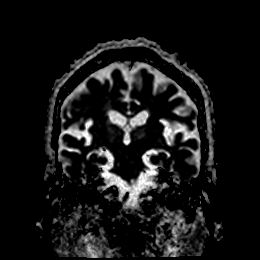
[im 35/35]
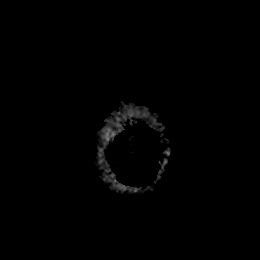

[Series 9: T1 · sagittal · 5.0mm · 0.75mm/px · 2 of 23 slices shown]
[im 1/23]
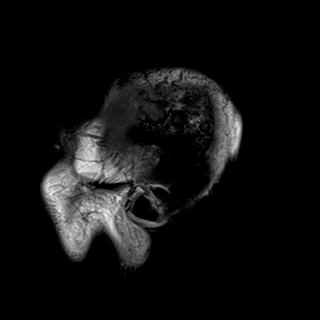
[im 23/23]
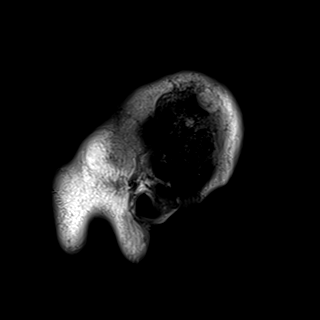

[Series 10: T2 · axial · 5.0mm · 0.72mm/px · z∈[-114,+24]mm · 2 of 25 slices shown (1 of 2)]
[im 1/25]
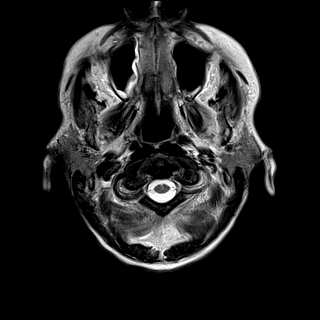
[im 25/25]
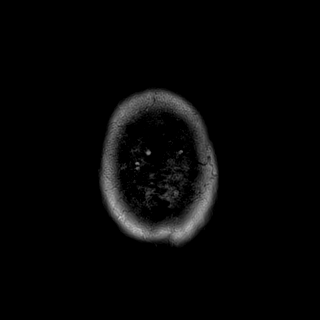

[Series 11: FLAIR · axial · 5.0mm · 0.45mm/px · z∈[-111,+27]mm · 2 of 25 slices shown]
[im 1/25]
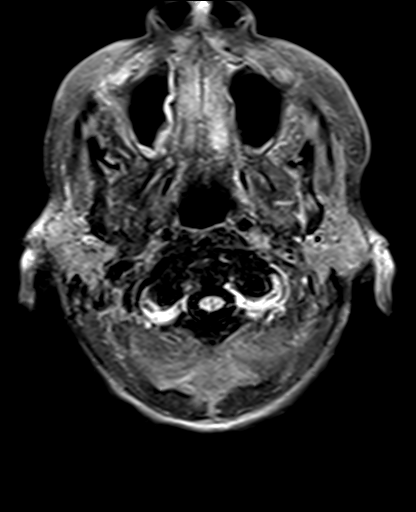
[im 25/25]
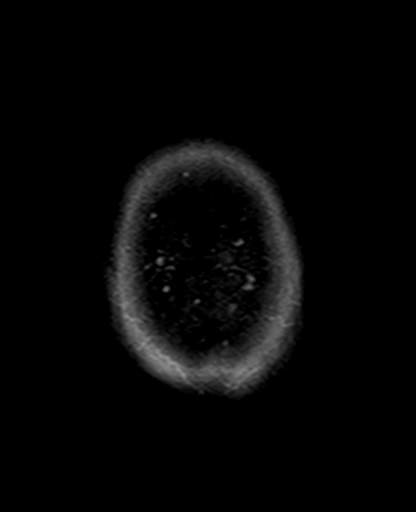

[Series 12: swi_images · axial · 3.0mm · 0.90mm/px · z∈[-120,+50]mm · 6 of 60 slices shown]
[im 1/60]
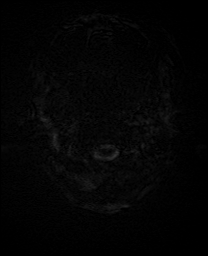
[im 12/60]
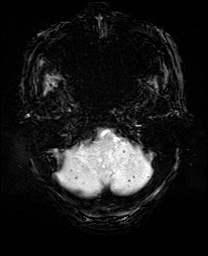
[im 24/60]
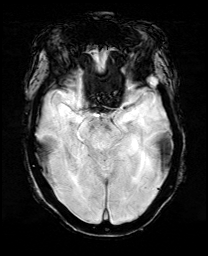
[im 36/60]
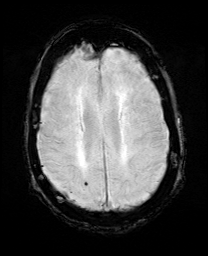
[im 48/60]
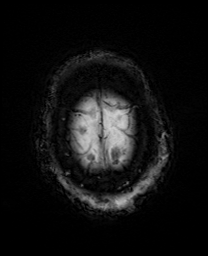
[im 60/60]
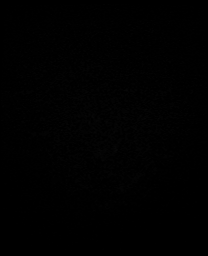

[Series 13: mip_images(sw) · axial · 24.0mm · 0.90mm/px · z∈[-110,+40]mm · 5 of 53 slices shown]
[im 1/53]
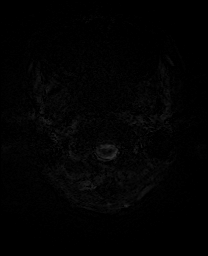
[im 14/53]
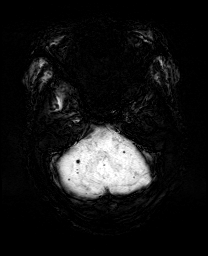
[im 27/53]
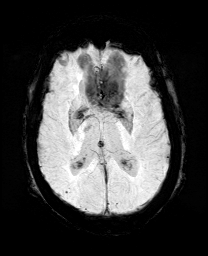
[im 40/53]
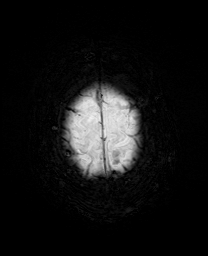
[im 53/53]
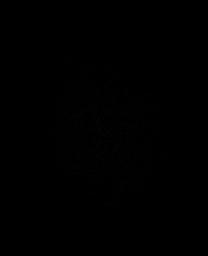

[Series 15: T2 · coronal · 5.0mm · 0.34mm/px · 3 of 29 slices shown (2 of 2)]
[im 1/29]
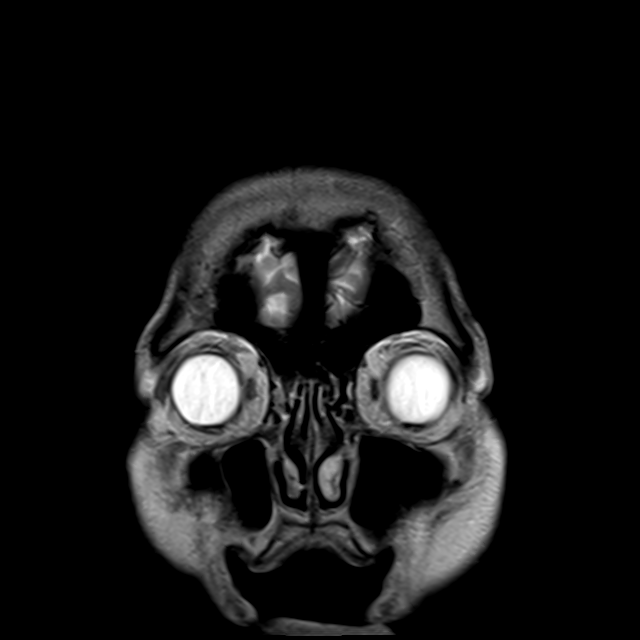
[im 15/29]
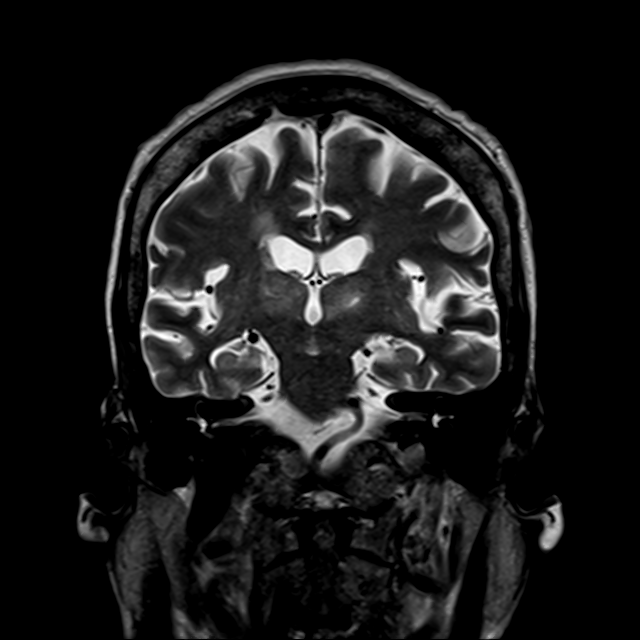
[im 29/29]
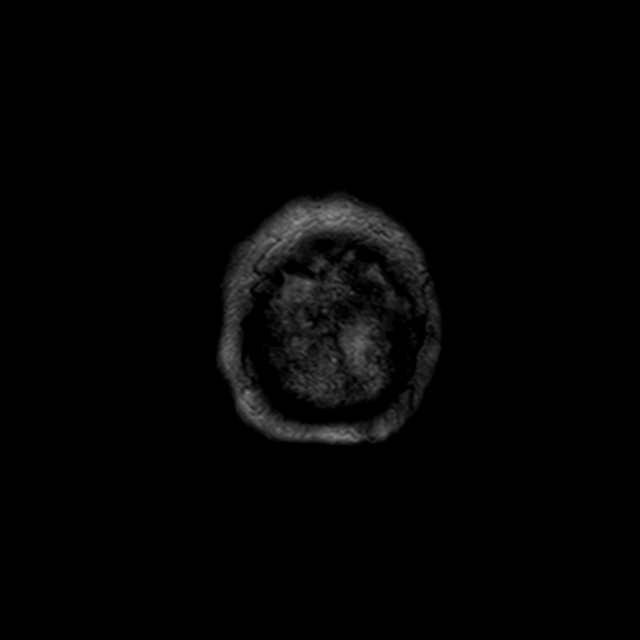

[42 of 48 positions shown; findings below may reference images not displayed]

FINDINGS: INTRACRANIAL CONTENTS: No reduced diffusion to suggest acute
ischemia. Numerous infratentorial to lesser extent supratentorial
chronic microhemorrhages. Old small LEFT cerebellar infarct. Hazy T2
hyperintense signal bilateral basal ganglia a increased from prior
MRI. Old bilateral basal ganglia and LEFT thalamus infarcts. Patchy
supratentorial white matter FLAIR T2 hyperintensities. The
ventricles and sulci are normal for patient's age. No suspicious
parenchymal signal, masses, mass effect. No abnormal extra-axial
fluid collections. No extra-axial masses.

VASCULAR: Normal major intracranial vascular flow voids present at
skull base.

SKULL AND UPPER CERVICAL SPINE: No abnormal sellar expansion. Mildly
reduced diffusion with low ADC values of numerous calvarial
metastasis corresponding to known multiple myeloma. Heterogeneous
calvarial and cervical spine bone marrow signal consistent with
myeloproliferative disease. Craniocervical junction maintained.

SINUSES/ORBITS: The mastoid air-cells and included paranasal sinuses
are well-aerated.The included ocular globes and orbital contents are
non-suspicious.

OTHER: Patient is edentulous.
IMPRESSION: 1. No acute infarct.
2. Increased basal ganglia and thalami edema seen with hypertensive
encephalopathy, less likely rapidly progressing small vessel
ischemic changes.
3. Moderate chronic small vessel ischemic changes. Multiple old
small supra and infratentorial infarcts.
4. Multiple osseous metastasis/multiple myeloma.
